# Patient Record
Sex: Female | Born: 1990 | Race: Black or African American | Hispanic: No | Marital: Single | Smoking: Former smoker
Health system: Southern US, Community
[De-identification: ages and names within clinical notes are randomized; demographics above are authoritative.]

## PROBLEM LIST (undated history)

## (undated) ENCOUNTER — Inpatient Hospital Stay (HOSPITAL_COMMUNITY): Payer: Self-pay

## (undated) DIAGNOSIS — F32A Depression, unspecified: Secondary | ICD-10-CM

## (undated) DIAGNOSIS — F209 Schizophrenia, unspecified: Secondary | ICD-10-CM

## (undated) DIAGNOSIS — B009 Herpesviral infection, unspecified: Secondary | ICD-10-CM

## (undated) DIAGNOSIS — F419 Anxiety disorder, unspecified: Secondary | ICD-10-CM

## (undated) DIAGNOSIS — E119 Type 2 diabetes mellitus without complications: Secondary | ICD-10-CM

## (undated) HISTORY — DX: Type 2 diabetes mellitus without complications: E11.9

## (undated) HISTORY — DX: Anxiety disorder, unspecified: F41.9

## (undated) HISTORY — PX: NO PAST SURGERIES: SHX2092

## (undated) HISTORY — DX: Depression, unspecified: F32.A

---

## 2018-05-28 DIAGNOSIS — E66811 Obesity, class 1: Secondary | ICD-10-CM | POA: Insufficient documentation

## 2018-05-28 DIAGNOSIS — Z6839 Body mass index (BMI) 39.0-39.9, adult: Secondary | ICD-10-CM | POA: Insufficient documentation

## 2018-10-14 HISTORY — PX: WISDOM TOOTH EXTRACTION: SHX21

## 2021-03-04 ENCOUNTER — Emergency Department (HOSPITAL_COMMUNITY): Payer: Medicare Other

## 2021-03-04 ENCOUNTER — Encounter (HOSPITAL_COMMUNITY): Payer: Self-pay | Admitting: Emergency Medicine

## 2021-03-04 ENCOUNTER — Other Ambulatory Visit: Payer: Self-pay

## 2021-03-04 ENCOUNTER — Emergency Department (HOSPITAL_COMMUNITY)
Admission: EM | Admit: 2021-03-04 | Discharge: 2021-03-04 | Disposition: A | Discharge status: Home / Self Care | Payer: Medicare Other | Attending: Emergency Medicine | Admitting: Emergency Medicine

## 2021-03-04 DIAGNOSIS — R059 Cough, unspecified: Secondary | ICD-10-CM | POA: Insufficient documentation

## 2021-03-04 DIAGNOSIS — Z20822 Contact with and (suspected) exposure to covid-19: Secondary | ICD-10-CM | POA: Insufficient documentation

## 2021-03-04 DIAGNOSIS — F17200 Nicotine dependence, unspecified, uncomplicated: Secondary | ICD-10-CM | POA: Diagnosis not present

## 2021-03-04 DIAGNOSIS — R000 Tachycardia, unspecified: Secondary | ICD-10-CM | POA: Diagnosis not present

## 2021-03-04 DIAGNOSIS — B349 Viral infection, unspecified: Secondary | ICD-10-CM

## 2021-03-04 MED ORDER — BENZONATATE 100 MG PO CAPS: 100.0000 mg | ORAL_CAPSULE | Freq: Three times a day (TID) | ORAL | 0 refills | Status: DC

## 2021-03-04 NOTE — ED Provider Notes (Signed)
[facility]-EMERGENCY DEPT Provider Note   CSN: [id number] Arrival date & time: 03/04/21  1631     History Chief Complaint  Patient presents with  . Cough    Nicole [name] is a 30 y.o. female.  HPI   Patient presents with cough x 2 days. States she was exposed to COVID 19 on Friday. Since then she has had fevers, chills, general body aches, abdominal pain and cough. She reports coughing up streaks of blood. She also has a headache and photophobia that started earlier today. She has not tried any medication for this. She denies any head trauma. This morning. She is COVID vaccinated x 2, no booster.   History reviewed. No pertinent past medical history.  There are no problems to display for this patient.   History reviewed. No pertinent surgical history.   OB History   No obstetric history on file.     No family history on file.  Social History   Tobacco Use  . Smoking status: Current Every Day Smoker  . Smokeless tobacco: Never Used  Substance Use Topics  . Alcohol use: Never  . Drug use: Never    Home Medications Prior to Admission medications   Not on File    Allergies    Patient has no allergy information on record.  Review of Systems   Review of Systems  Constitutional: Positive for appetite change, chills, fatigue and fever.  Respiratory: Positive for cough. Negative for chest tightness and shortness of breath.   Cardiovascular: Negative for chest pain and leg swelling.  Gastrointestinal: Positive for abdominal pain and nausea. Negative for blood in stool, constipation and vomiting.  Musculoskeletal: Positive for myalgias.    Physical Exam Updated Vital Signs BP 134/85 (BP Location: Right Arm)   Pulse 96   Temp 99.1 F (37.3 C) (Oral)   Resp 18   LMP 03/04/2021   SpO2 97%   Physical Exam Vitals and nursing note reviewed. Exam conducted with a chaperone present.  Constitutional:      General: She is not in acute  distress.    Appearance: Normal appearance.     Comments: Sitting in the room comfortably. Wearing sunglasses.  HENT:     Head: Normocephalic and atraumatic.     Nose: Nose normal. No congestion or rhinorrhea.     Mouth/Throat:     Mouth: Mucous membranes are moist.     Pharynx: No oropharyngeal exudate or posterior oropharyngeal erythema.  Eyes:     General: No scleral icterus.       Right eye: No discharge.        Left eye: No discharge.     Extraocular Movements: Extraocular movements intact.     Pupils: Pupils are equal, round, and reactive to light.  Cardiovascular:     Rate and Rhythm: Regular rhythm. Tachycardia present.     Pulses: Normal pulses.     Heart sounds: Normal heart sounds. No murmur heard. No friction rub. No gallop.   Pulmonary:     Effort: Pulmonary effort is normal. No respiratory distress.     Breath sounds: Normal breath sounds.     Comments: Speaking in complete sentences. Lungs CTA. Abdominal:     General: Abdomen is flat. Bowel sounds are normal. There is no distension.     Palpations: Abdomen is soft.     Tenderness: There is no abdominal tenderness.  Skin:    General: Skin is warm and dry.     Coloration: Skin  is not jaundiced.  Neurological:     General: No focal deficit present.     Mental Status: She is alert. Mental status is at baseline.     Coordination: Coordination normal.     ED Results / Procedures / Treatments   Labs (all labs ordered are listed, but only abnormal results are displayed) Labs Reviewed  SARS CORONAVIRUS 2 (TAT 6-24 HRS)    EKG None  Radiology No results found.  Procedures Procedures   Medications Ordered in ED Medications - No data to display  ED Course  I have reviewed the triage vital signs and the nursing notes.  Pertinent labs & imaging results that were available during my care of the patient were reviewed by me and considered in my medical decision making (see chart for details).    MDM  Rules/Calculators/A&P                          Patient nontoxic appearing with stable vitals on intake. She was tachycardia to 105 on exam. Otherwise unremarkable PE. CXR ordered to evaluate for pneumonia and resp. Swab for COVID/FLU. Doubt emergent pathology of headache given age, does not smoke, no head trauma, no focal deficits on Neuro exam. Perc negative  CXR without signs PNA, PTX or emergent pulmonary pathology. Patient is agreeable to taking motrin and tylenol as needed for symptomatic control. Also prescribed her tessalon perles. She is agreeable to being called if her covid comes back positive. Return precautions given. Patient is agreeable and verbalized understanding to the plan.   Final Clinical Impression(s) / ED Diagnoses Final diagnoses:  None    Rx / DC Orders ED Discharge Orders    None       Theron Arista, Cordelia Poche 03/09/21 1214    Charlynne Pander, MD 03/13/21 [phone]

## 2021-03-04 NOTE — ED Notes (Signed)
Patient reports that she does not have a ride home.

## 2021-03-04 NOTE — Discharge Instructions (Signed)
You were seen today for viral symptoms. IF you are COVID+ you will get a phone call tonight from the hospital. If you are negative, you will not hear from [place]. You can alternate tylenol and ibuprofen as needed for pain control. I've also prescribed you teslon pearls to help with the coughing. If you experience uncontrollable vomiting, respiratory distress or are unable to keep in fluids please return to the ED. Call to establish care with [facility] to establish a PCP.

## 2021-03-04 NOTE — ED Notes (Signed)
Patient provided with phone to attempt to call ride.

## 2021-03-04 NOTE — ED Triage Notes (Signed)
Arrives via EMS from home, C/C cough and abdominal pain, exposed to COVID on Friday, endorses nausea but constant nausea. Did not take any medication today. Denies N/V/D. Vital signs WNL.

## 2021-03-05 LAB — SARS CORONAVIRUS 2 (TAT 6-24 HRS): SARS Coronavirus 2: NEGATIVE

## 2021-06-14 ENCOUNTER — Other Ambulatory Visit: Payer: Self-pay | Admitting: Emergency Medicine

## 2021-06-15 LAB — C. TRACHOMATIS/N. GONORRHOEAE RNA
C. trachomatis RNA, TMA: NOT DETECTED
N. gonorrhoeae RNA, TMA: NOT DETECTED

## 2021-07-13 ENCOUNTER — Emergency Department (HOSPITAL_COMMUNITY)
Admission: EM | Admit: 2021-07-13 | Discharge: 2021-07-13 | Disposition: A | Discharge status: Home / Self Care | Payer: Medicare Other | Attending: Emergency Medicine | Admitting: Emergency Medicine

## 2021-07-13 DIAGNOSIS — N390 Urinary tract infection, site not specified: Secondary | ICD-10-CM | POA: Diagnosis not present

## 2021-07-13 DIAGNOSIS — R300 Dysuria: Secondary | ICD-10-CM | POA: Diagnosis present

## 2021-07-13 DIAGNOSIS — F17210 Nicotine dependence, cigarettes, uncomplicated: Secondary | ICD-10-CM | POA: Diagnosis not present

## 2021-07-13 DIAGNOSIS — Z20822 Contact with and (suspected) exposure to covid-19: Secondary | ICD-10-CM | POA: Insufficient documentation

## 2021-07-13 LAB — COMPREHENSIVE METABOLIC PANEL
ALT: 17 U/L (ref 0–44)
AST: 14 U/L — ABNORMAL LOW (ref 15–41)
Albumin: 3.6 g/dL (ref 3.5–5.0)
Alkaline Phosphatase: 54 U/L (ref 38–126)
Anion gap: 7 (ref 5–15)
BUN: 11 mg/dL (ref 6–20)
CO2: 24 mmol/L (ref 22–32)
Calcium: 8.9 mg/dL (ref 8.9–10.3)
Chloride: 105 mmol/L (ref 98–111)
Creatinine, Ser: 0.84 mg/dL (ref 0.44–1.00)
GFR, Estimated: >60 mL/min (ref >60)
Glucose, Bld: 82 mg/dL (ref 70–99)
Potassium: 4 mmol/L (ref 3.5–5.1)
Sodium: 136 mmol/L (ref 135–145)
Total Bilirubin: 0.4 mg/dL (ref 0.3–1.2)
Total Protein: 6.6 g/dL (ref 6.5–8.1)

## 2021-07-13 LAB — CBC WITH DIFFERENTIAL/PLATELET
Abs Immature Granulocytes: 0.02 10*3/uL (ref 0.00–0.07)
Basophils Absolute: 0 10*3/uL (ref 0.0–0.1)
Basophils Relative: 0 %
Eosinophils Absolute: 0 10*3/uL (ref 0.0–0.5)
Eosinophils Relative: 0 %
HCT: 37.9 % (ref 36.0–46.0)
Hemoglobin: 12.4 g/dL (ref 12.0–15.0)
Immature Granulocytes: 0 %
Lymphocytes Relative: 53 %
Lymphs Abs: 3.2 10*3/uL (ref 0.7–4.0)
MCH: 29.2 pg (ref 26.0–34.0)
MCHC: 32.7 g/dL (ref 30.0–36.0)
MCV: 89.4 fL (ref 80.0–100.0)
Monocytes Absolute: 0.4 10*3/uL (ref 0.1–1.0)
Monocytes Relative: 6 %
Neutro Abs: 2.5 10*3/uL (ref 1.7–7.7)
Neutrophils Relative %: 41 %
Platelets: 253 10*3/uL (ref 150–400)
RBC: 4.24 MIL/uL (ref 3.87–5.11)
RDW: 13.5 % (ref 11.5–15.5)
WBC: 6.1 10*3/uL (ref 4.0–10.5)
nRBC: 0 % (ref 0.0–0.2)

## 2021-07-13 LAB — URINALYSIS, ROUTINE W REFLEX MICROSCOPIC
Bilirubin Urine: NEGATIVE
Glucose, UA: NEGATIVE mg/dL
Ketones, ur: NEGATIVE mg/dL
Nitrite: NEGATIVE
Protein, ur: NEGATIVE mg/dL
Specific Gravity, Urine: 1.025 (ref 1.005–1.030)
pH: 7 (ref 5.0–8.0)

## 2021-07-13 LAB — LIPASE, BLOOD: Lipase: 27 U/L (ref 11–51)

## 2021-07-13 LAB — URINALYSIS, MICROSCOPIC (REFLEX)

## 2021-07-13 LAB — I-STAT BETA HCG BLOOD, ED (MC, WL, AP ONLY): I-stat hCG, quantitative: <5 m[IU]/mL (ref <5)

## 2021-07-13 LAB — RESP PANEL BY RT-PCR (FLU A&B, COVID) ARPGX2
Influenza A by PCR: NEGATIVE
Influenza B by PCR: NEGATIVE
SARS Coronavirus 2 by RT PCR: NEGATIVE

## 2021-07-13 MED ORDER — ONDANSETRON HCL 4 MG PO TABS: 4.0000 mg | ORAL_TABLET | Freq: Four times a day (QID) | ORAL | 0 refills | Status: DC

## 2021-07-13 MED ORDER — CEPHALEXIN 500 MG PO CAPS: 500.0000 mg | ORAL_CAPSULE | Freq: Two times a day (BID) | ORAL | 0 refills | Status: AC

## 2021-07-13 MED ORDER — CEPHALEXIN 250 MG PO CAPS
500.0000 mg | ORAL_CAPSULE | Freq: Once | ORAL | Status: AC
  Administered 2021-07-13: 500 mg via ORAL
  Filled 2021-07-13: qty 2

## 2021-07-13 MED ORDER — ONDANSETRON HCL 4 MG PO TABS
4.0000 mg | ORAL_TABLET | Freq: Once | ORAL | Status: AC
  Administered 2021-07-13: 4 mg via ORAL
  Filled 2021-07-13: qty 1

## 2021-07-13 NOTE — ED Provider Notes (Signed)
Emergency Medicine Provider Triage Evaluation Note  Nicole [name] , a 30 y.o. female  was evaluated in triage.  Pt complains of nv, abd pain, urinary sxs for 1 week. Group home wants a covid test.  Review of Systems  Positive: Nv, lower abd pain, urinary sxs Negative: cough  Physical Exam  BP 111/77 (BP Location: Right Arm)   Pulse 97   Temp 98.6 F (37 C) (Oral)   Resp 18   SpO2 98%  Gen:   Awake, no distress   Resp:  Normal effort  MSK:   Moves extremities without difficulty   Medical Decision Making  Medically screening exam initiated at 3:00 PM.  Appropriate orders placed.  [name] was informed that the remainder of the evaluation will be completed by another provider, this initial triage assessment does not replace that evaluation, and the importance of remaining in the ED until their evaluation is complete.     [name], PA-C 07/13/21 1501    [name], MD 07/15/21 1124

## 2021-07-13 NOTE — ED Triage Notes (Signed)
Pt here from group home with c/o generalized body aches and malaise X 1 week. Group home owner wants pt tested for covid .

## 2021-07-13 NOTE — ED Notes (Signed)
Pt refused dc vital signs because her cab was here to pick her up.

## 2021-07-13 NOTE — ED Provider Notes (Signed)
[facility] EMERGENCY DEPARTMENT Provider Note   CSN: [id number] Arrival date & time: 07/13/21  1454     History Chief Complaint  Patient presents with   Generalized Body Aches    Nicole [name] is a 30 y.o. female.  The history is provided by the patient.  Dysuria Pain quality:  Aching Pain severity:  Mild Onset quality:  Gradual Timing:  Constant Progression:  Unchanged Chronicity:  New Recent urinary tract infections: yes   Relieved by:  Nothing Worsened by:  Nothing Urinary symptoms: frequent urination   Associated symptoms: nausea   Associated symptoms: no abdominal pain, no fever, no flank pain, no genital lesions, no vaginal discharge and no vomiting   Associated symptoms comment:  Patient is also felt achy     No past medical history on file.  There are no problems to display for this patient.   No past surgical history on file.   OB History   No obstetric history on file.     No family history on file.  Social History   Tobacco Use   Smoking status: Every Day   Smokeless tobacco: Never  Substance Use Topics   Alcohol use: Never   Drug use: Never    Home Medications Prior to Admission medications   Medication Sig Start Date End Date Taking? Authorizing Provider  cephALEXin (KEFLEX) 500 MG capsule Take 1 capsule (500 mg total) by mouth 2 (two) times daily for 5 days. 07/13/21 07/18/21 Yes Piper Albro, DO  ondansetron (ZOFRAN) 4 MG tablet Take 1 tablet (4 mg total) by mouth every 6 (six) hours. 07/13/21  Yes Isaih Bulger, DO  benzonatate (TESSALON) 100 MG capsule Take 1 capsule (100 mg total) by mouth every 8 (eight) hours. 03/04/21   Theron Arista, PA-C    Allergies    Patient has no allergy information on record.  Review of Systems   Review of Systems  Constitutional:  Negative for chills and fever.  HENT:  Negative for ear pain and sore throat.   Eyes:  Negative for pain and visual disturbance.  Respiratory:  Negative  for cough and shortness of breath.   Cardiovascular:  Negative for chest pain and palpitations.  Gastrointestinal:  Positive for nausea. Negative for abdominal pain and vomiting.  Genitourinary:  Positive for dysuria. Negative for decreased urine volume, flank pain, hematuria, pelvic pain and vaginal discharge.  Musculoskeletal:  Negative for arthralgias and back pain.  Skin:  Negative for color change and rash.  Neurological:  Negative for seizures and syncope.  All other systems reviewed and are negative.  Physical Exam Updated Vital Signs BP 111/77 (BP Location: Right Arm)   Pulse 97   Temp 98.6 F (37 C) (Oral)   Resp 18   SpO2 98%   Physical Exam Vitals and nursing note reviewed.  Constitutional:      General: She is not in acute distress.    Appearance: She is well-developed. She is not ill-appearing.  HENT:     Head: Normocephalic and atraumatic.  Eyes:     Extraocular Movements: Extraocular movements intact.     Conjunctiva/sclera: Conjunctivae normal.     Pupils: Pupils are equal, round, and reactive to light.  Cardiovascular:     Rate and Rhythm: Normal rate and regular rhythm.     Pulses: Normal pulses.     Heart sounds: Normal heart sounds. No murmur heard. Pulmonary:     Effort: Pulmonary effort is normal. No respiratory distress.  Breath sounds: Normal breath sounds.  Abdominal:     Palpations: Abdomen is soft.     Tenderness: There is no abdominal tenderness.  Musculoskeletal:     Cervical back: Normal range of motion and neck supple.  Skin:    General: Skin is warm and dry.     Capillary Refill: Capillary refill takes less than 2 seconds.  Neurological:     General: No focal deficit present.     Mental Status: She is alert.    ED Results / Procedures / Treatments   Labs (all labs ordered are listed, but only abnormal results are displayed) Labs Reviewed  COMPREHENSIVE METABOLIC PANEL - Abnormal; Notable for the following components:      Result  Value   AST 14 (*)    All other components within normal limits  URINALYSIS, ROUTINE W REFLEX MICROSCOPIC - Abnormal; Notable for the following components:   Hgb urine dipstick TRACE (*)    Leukocytes,Ua LARGE (*)    All other components within normal limits  URINALYSIS, MICROSCOPIC (REFLEX) - Abnormal; Notable for the following components:   Bacteria, UA MANY (*)    All other components within normal limits  RESP PANEL BY RT-PCR (FLU A&B, COVID) ARPGX2  URINE CULTURE  CBC WITH DIFFERENTIAL/PLATELET  LIPASE, BLOOD  I-STAT BETA HCG BLOOD, ED ([facility], [facility], [facility] ONLY)    EKG None  Radiology No results found.  Procedures Procedures   Medications Ordered in ED Medications  ondansetron (ZOFRAN) tablet 4 mg (has no administration in time range)  cephALEXin (KEFLEX) capsule 500 mg (has no administration in time range)    ED Course  I have reviewed the triage vital signs and the nursing notes.  Pertinent labs & imaging results that were available during my care of the patient were reviewed by me and considered in my medical decision making (see chart for details).    MDM Rules/Calculators/A&P                           Nicole [name] is here with body aches, dysuria.  Normal vitals.  No fever.  Denies any vaginal bleeding, vaginal discharge, hematuria, flank pain.  Has felt nauseous.  No abdominal pain.  Feels like UTI.  Concern may be for COVID.  Abdominal exam is benign.  She is very well-appearing.  Lab work was collected prior to my evaluation that is unremarkable.  No leukocytosis, no anemia, no significant electrolyte abnormality.  hCG within normal and not pregnant.  Urinalysis appears to be consistent with infection and she is symptomatic.  Will treat with antibiotics.  Will give Zofran.  Discharged in good condition.  Understands return precautions.  This chart was dictated using voice recognition software.  Despite best efforts to proofread,  errors can occur which can change the  documentation meaning. \ Final Clinical Impression(s) / ED Diagnoses Final diagnoses:  Lower urinary tract infectious disease    Rx / DC Orders ED Discharge Orders          Ordered    cephALEXin (KEFLEX) 500 MG capsule  2 times daily        07/13/21 1739    ondansetron (ZOFRAN) 4 MG tablet  Every 6 hours        07/13/21 1739             Virgina Norfolk, DO 07/13/21 1739

## 2021-07-15 LAB — URINE CULTURE: Culture: >=100000 — AB

## 2021-07-16 ENCOUNTER — Telehealth: Payer: Self-pay

## 2021-07-16 NOTE — Telephone Encounter (Signed)
Post ED Visit - Positive Culture Follow-up  Culture report reviewed by antimicrobial stewardship pharmacist: [facility] [x]  , Pharm.D. []  Loleta Dicker, Pharm.D., BCPS AQ-ID []  , Pharm.D., BCPS []  Celedonio Miyamoto, Pharm.D., BCPS []  [place], Garvin Fila.D., BCPS, AAHIVP []  , Pharm.D., BCPS, AAHIVP []  Georgina Pillion, PharmD, BCPS []  , PharmD, BCPS []  [place], PharmD, BCPS []  [place], PharmD []  , PharmD, BCPS []  Estella Husk, PharmD  Pharmacy Team []  Lysle Pearl, PharmD []  , PharmD []  Phillips Climes, PharmD []  , Rph []  Agapito Games) , PharmD []  Verlan Friends, PharmD []  , PharmD []  Mervyn Gay, PharmD []  , PharmD []  Vinnie Level, PharmD []  [facility], PharmD []  , PharmD []  Len Childs, PharmD   Positive urine culture Treated with Cephalexin, organism sensitive to the same and no further patient follow-up is required at this time.  07/16/2021, 11:45 AM

## 2021-08-30 ENCOUNTER — Other Ambulatory Visit: Payer: Self-pay

## 2021-08-30 ENCOUNTER — Encounter: Payer: Self-pay | Admitting: Podiatry

## 2021-08-30 ENCOUNTER — Ambulatory Visit (INDEPENDENT_AMBULATORY_CARE_PROVIDER_SITE_OTHER): Payer: Medicare Other | Admitting: Podiatry

## 2021-08-30 DIAGNOSIS — L603 Nail dystrophy: Secondary | ICD-10-CM

## 2021-09-03 NOTE — Progress Notes (Signed)
  Subjective:  Patient ID: Nicole [name], female    DOB: December 15, 1990,  MRN: [id number]  Chief Complaint  Patient presents with   Ingrown Toenail     NP Ingrown toenail left foot     30 y.o. female presents with the above complaint. History confirmed with patient.  Nail is lifting off and discolored  Objective:  Physical Exam: warm, good capillary refill, no trophic changes or ulcerative lesions, normal DP and PT pulses, and normal sensory exam. Left Foot: normal exam, no swelling, tenderness, instability; ligaments intact, full range of motion of all ankle/foot joints Right Foot: Hallux nail with slight onycholysis and brown discoloration  Assessment:   1. Nail dystrophy      Plan:  Patient was evaluated and treated and all questions answered.  Discussed etiology and treatments for nail dystrophy onycholysis and onychomycosis.  I recommended culture of the nail plate to elucidate the etiology of this.  We will discuss her results once we have them back and decide on treatment options.  Return for i will call with test results .

## 2021-09-29 ENCOUNTER — Emergency Department (HOSPITAL_COMMUNITY)
Admission: EM | Admit: 2021-09-29 | Discharge: 2021-09-29 | Disposition: A | Discharge status: Home / Self Care | Payer: Medicare Other | Attending: Emergency Medicine | Admitting: Emergency Medicine

## 2021-09-29 ENCOUNTER — Encounter (HOSPITAL_COMMUNITY): Payer: Self-pay

## 2021-09-29 DIAGNOSIS — R112 Nausea with vomiting, unspecified: Secondary | ICD-10-CM | POA: Diagnosis not present

## 2021-09-29 DIAGNOSIS — R109 Unspecified abdominal pain: Secondary | ICD-10-CM | POA: Diagnosis present

## 2021-09-29 DIAGNOSIS — Z5321 Procedure and treatment not carried out due to patient leaving prior to being seen by health care provider: Secondary | ICD-10-CM | POA: Insufficient documentation

## 2021-09-29 LAB — LIPASE, BLOOD: Lipase: 28 U/L (ref 11–51)

## 2021-09-29 LAB — URINALYSIS, ROUTINE W REFLEX MICROSCOPIC
Bilirubin Urine: NEGATIVE
Glucose, UA: NEGATIVE mg/dL
Hgb urine dipstick: NEGATIVE
Ketones, ur: NEGATIVE mg/dL
Nitrite: NEGATIVE
Protein, ur: NEGATIVE mg/dL
Specific Gravity, Urine: >1.03 — ABNORMAL HIGH (ref 1.005–1.030)
pH: 6 (ref 5.0–8.0)

## 2021-09-29 LAB — URINALYSIS, MICROSCOPIC (REFLEX)

## 2021-09-29 LAB — COMPREHENSIVE METABOLIC PANEL
ALT: 18 U/L (ref 0–44)
AST: 17 U/L (ref 15–41)
Albumin: 3.8 g/dL (ref 3.5–5.0)
Alkaline Phosphatase: 61 U/L (ref 38–126)
Anion gap: 5 (ref 5–15)
BUN: 15 mg/dL (ref 6–20)
CO2: 25 mmol/L (ref 22–32)
Calcium: 8.9 mg/dL (ref 8.9–10.3)
Chloride: 105 mmol/L (ref 98–111)
Creatinine, Ser: 0.84 mg/dL (ref 0.44–1.00)
GFR, Estimated: >60 mL/min (ref >60)
Glucose, Bld: 90 mg/dL (ref 70–99)
Potassium: 3.6 mmol/L (ref 3.5–5.1)
Sodium: 135 mmol/L (ref 135–145)
Total Bilirubin: 0.4 mg/dL (ref 0.3–1.2)
Total Protein: 6.9 g/dL (ref 6.5–8.1)

## 2021-09-29 LAB — I-STAT BETA HCG BLOOD, ED (MC, WL, AP ONLY): I-stat hCG, quantitative: <5 m[IU]/mL (ref <5)

## 2021-09-29 LAB — CBC
HCT: 39.2 % (ref 36.0–46.0)
Hemoglobin: 13 g/dL (ref 12.0–15.0)
MCH: 30.2 pg (ref 26.0–34.0)
MCHC: 33.2 g/dL (ref 30.0–36.0)
MCV: 91.2 fL (ref 80.0–100.0)
Platelets: 273 10*3/uL (ref 150–400)
RBC: 4.3 MIL/uL (ref 3.87–5.11)
RDW: 13 % (ref 11.5–15.5)
WBC: 6.9 10*3/uL (ref 4.0–10.5)
nRBC: 0 % (ref 0.0–0.2)

## 2021-09-29 NOTE — ED Triage Notes (Signed)
Pt comes via [facility] from [facility] group home for abd pain and n/v for the past hour.

## 2021-09-29 NOTE — ED Notes (Signed)
Pt LWBS 

## 2021-10-12 ENCOUNTER — Emergency Department (HOSPITAL_COMMUNITY): Payer: Medicare Other

## 2021-10-12 ENCOUNTER — Encounter (HOSPITAL_COMMUNITY): Payer: Self-pay | Admitting: Obstetrics & Gynecology

## 2021-10-12 ENCOUNTER — Other Ambulatory Visit: Payer: Self-pay

## 2021-10-12 ENCOUNTER — Emergency Department (HOSPITAL_COMMUNITY)
Admission: AD | Admit: 2021-10-12 | Discharge: 2021-10-13 | Disposition: A | Discharge status: Home / Self Care | Payer: Medicare Other | Attending: Obstetrics & Gynecology | Admitting: Obstetrics & Gynecology

## 2021-10-12 DIAGNOSIS — N83202 Unspecified ovarian cyst, left side: Secondary | ICD-10-CM | POA: Insufficient documentation

## 2021-10-12 DIAGNOSIS — F17200 Nicotine dependence, unspecified, uncomplicated: Secondary | ICD-10-CM | POA: Diagnosis not present

## 2021-10-12 DIAGNOSIS — N939 Abnormal uterine and vaginal bleeding, unspecified: Secondary | ICD-10-CM

## 2021-10-12 DIAGNOSIS — R102 Pelvic and perineal pain: Secondary | ICD-10-CM

## 2021-10-12 DIAGNOSIS — R1030 Lower abdominal pain, unspecified: Secondary | ICD-10-CM | POA: Insufficient documentation

## 2021-10-12 DIAGNOSIS — R5383 Other fatigue: Secondary | ICD-10-CM | POA: Insufficient documentation

## 2021-10-12 LAB — COMPREHENSIVE METABOLIC PANEL
ALT: 17 U/L (ref 0–44)
AST: 16 U/L (ref 15–41)
Albumin: 3.6 g/dL (ref 3.5–5.0)
Alkaline Phosphatase: 65 U/L (ref 38–126)
Anion gap: 4 — ABNORMAL LOW (ref 5–15)
BUN: 14 mg/dL (ref 6–20)
CO2: 24 mmol/L (ref 22–32)
Calcium: 8.5 mg/dL — ABNORMAL LOW (ref 8.9–10.3)
Chloride: 107 mmol/L (ref 98–111)
Creatinine, Ser: 0.95 mg/dL (ref 0.44–1.00)
GFR, Estimated: >60 mL/min (ref >60)
Glucose, Bld: 76 mg/dL (ref 70–99)
Potassium: 3.5 mmol/L (ref 3.5–5.1)
Sodium: 135 mmol/L (ref 135–145)
Total Bilirubin: 0.1 mg/dL — ABNORMAL LOW (ref 0.3–1.2)
Total Protein: 6.9 g/dL (ref 6.5–8.1)

## 2021-10-12 LAB — URINALYSIS, ROUTINE W REFLEX MICROSCOPIC
Bilirubin Urine: NEGATIVE
Glucose, UA: NEGATIVE mg/dL
Ketones, ur: NEGATIVE mg/dL
Nitrite: NEGATIVE
Protein, ur: 30 mg/dL — AB
Specific Gravity, Urine: 1.025 (ref 1.005–1.030)
pH: 6 (ref 5.0–8.0)

## 2021-10-12 LAB — CBC WITH DIFFERENTIAL/PLATELET
Abs Immature Granulocytes: 0.01 10*3/uL (ref 0.00–0.07)
Basophils Absolute: 0 10*3/uL (ref 0.0–0.1)
Basophils Relative: 0 %
Eosinophils Absolute: 0 10*3/uL (ref 0.0–0.5)
Eosinophils Relative: 0 %
HCT: 37.1 % (ref 36.0–46.0)
Hemoglobin: 12.2 g/dL (ref 12.0–15.0)
Immature Granulocytes: 0 %
Lymphocytes Relative: 61 %
Lymphs Abs: 3.3 10*3/uL (ref 0.7–4.0)
MCH: 30.2 pg (ref 26.0–34.0)
MCHC: 32.9 g/dL (ref 30.0–36.0)
MCV: 91.8 fL (ref 80.0–100.0)
Monocytes Absolute: 0.3 10*3/uL (ref 0.1–1.0)
Monocytes Relative: 5 %
Neutro Abs: 1.8 10*3/uL (ref 1.7–7.7)
Neutrophils Relative %: 34 %
Platelets: 233 10*3/uL (ref 150–400)
RBC: 4.04 MIL/uL (ref 3.87–5.11)
RDW: 13 % (ref 11.5–15.5)
WBC: 5.5 10*3/uL (ref 4.0–10.5)
nRBC: 0 % (ref 0.0–0.2)

## 2021-10-12 LAB — I-STAT BETA HCG BLOOD, ED (MC, WL, AP ONLY): I-stat hCG, quantitative: <5 m[IU]/mL (ref <5)

## 2021-10-12 LAB — LIPASE, BLOOD: Lipase: 30 U/L (ref 11–51)

## 2021-10-12 LAB — POCT PREGNANCY, URINE: Preg Test, Ur: NEGATIVE

## 2021-10-12 MED ORDER — IOHEXOL 300 MG/ML  SOLN
100.0000 mL | Freq: Once | INTRAMUSCULAR | Status: AC | PRN
  Administered 2021-10-12: 21:00:00 100 mL via INTRAVENOUS

## 2021-10-12 NOTE — MAU Note (Signed)
Report given to [facility], ED Charge Nurse.

## 2021-10-12 NOTE — MAU Provider Note (Signed)
° °  S [name] is a 30 y.o. No obstetric history on file. patient who presents to MAU today with complaint of vaginal bleeding abdominal and low back pain. Patient reports this is the third time this month that she has gotten her period. For the past 3 hours has changed her pad every hour. This bleeding is atypical for her. Reports feeling fatigued, having more headaches, and nausea. Started having mid sternal chest pain last night that she currently rates 7/10. Pain is constant. Denies SOB, dizziness, or cardiac history. Took ibuprofen last night which helped ease the pain some, none today. Patient has an IUD that was placed in 2020. Has not taken a home pregnancy test.   O BP 120/89 (BP Location: Right Arm)    Pulse (!) 105    Temp 98.4 F (36.9 C) (Oral)    Resp 17    Ht 5\' 1"  (1.549 m)    Wt 90.9 kg    SpO2 100%    BMI 37.87 kg/m   Physical Exam Vitals and nursing note reviewed.  Constitutional:      General: She is not in acute distress.    Appearance: She is obese. She is not ill-appearing.  Cardiovascular:     Rate and Rhythm: Tachycardia present.  Pulmonary:     Effort: Pulmonary effort is normal. No respiratory distress.  Abdominal:     Palpations: Abdomen is soft.     Tenderness: There is no abdominal tenderness.  Skin:    General: Skin is warm and dry.  Neurological:     General: No focal deficit present.     Mental Status: She is alert and oriented to person, place, and time.  Psychiatric:        Mood and Affect: Affect is flat.        Speech: Speech normal.        Behavior: Behavior is cooperative.   EKG performed  A Medical screening exam complete UPT negative Vaginal bleeding Abdominal pain Chest pain  P Spoke with , PA in [place], patient transferred for further evaluation and work up    [place], [employer] 10/12/2021 1:57 PM

## 2021-10-12 NOTE — MAU Note (Signed)
...  Nicole [name] is a 30 y.o. at Unknown here in MAU reporting: This is her third time bleeding this month and it started this past Tuesday or Wednesday. She states she is experiencing constant lower abdominal pain and fatigue. She states she is filling three maxi pads in one hour and at times will use tampons. She states she is concerned she has an internal bump or cyst because she experiences pain when she removes her tampon.  Currently has an IUD that was placed in 2020.  9/10 lower abdominal

## 2021-10-12 NOTE — ED Provider Notes (Signed)
Emergency Medicine Provider Triage Evaluation Note  Nicole [name] , a 30 y.o. female  was evaluated in triage.  Pt complains of diffuse abdominal pain and vaginal bleeding.  She states that she has had 3 episodes this month of heavy vaginal bleeding that have lasted around 5 to 7 days total.  Was seen and evaluated at MAU and had negative pregnancy test and was sent down to the emergency department for evaluation.  Review of Systems  Positive:  Negative: See above   Physical Exam  BP 113/85 (BP Location: Left Arm)    Pulse 94    Temp 98.7 F (37.1 C) (Oral)    Resp 18    Ht 5\' 1"  (1.549 m)    Wt 90.9 kg    SpO2 100%    BMI 37.87 kg/m  Gen:   Awake, no distress   Resp:  Normal effort  MSK:   Moves extremities without difficulty  Other:  Diffuse abdominal tenderness  Medical Decision Making  Medically screening exam initiated at 3:27 PM.  Appropriate orders placed.  Nicole [name] was informed that the remainder of the evaluation will be completed by another provider, this initial triage assessment does not replace that evaluation, and the importance of remaining in the ED until their evaluation is complete.     Nicole Masson [place], PA-C 10/12/21 1528    10/14/21, MD 10/12/21 [phone]

## 2021-10-12 NOTE — ED Triage Notes (Signed)
Pt reports to me she is here because she has had 3 periods in the last month, lower abdominal pain and wants to know why she hasn't stopped bleeding. Seen at MAU and transferred to [place] because of negative pregnancy test. VSS at present.

## 2021-10-13 ENCOUNTER — Emergency Department (HOSPITAL_COMMUNITY): Payer: Medicare Other

## 2021-10-13 DIAGNOSIS — N939 Abnormal uterine and vaginal bleeding, unspecified: Secondary | ICD-10-CM | POA: Diagnosis not present

## 2021-10-13 LAB — WET PREP, GENITAL
Clue Cells Wet Prep HPF POC: NONE SEEN
Sperm: NONE SEEN
WBC, Wet Prep HPF POC: <10 (ref <10)
Yeast Wet Prep HPF POC: NONE SEEN

## 2021-10-13 MED ORDER — NAPROXEN 500 MG PO TABS: 500.0000 mg | ORAL_TABLET | Freq: Two times a day (BID) | ORAL | 0 refills | Status: AC

## 2021-10-13 MED ORDER — METRONIDAZOLE 500 MG PO TABS: 500.0000 mg | ORAL_TABLET | Freq: Two times a day (BID) | ORAL | 0 refills | Status: AC

## 2021-10-13 MED ORDER — HYDROCODONE-ACETAMINOPHEN 5-325 MG PO TABS
1.0000 | ORAL_TABLET | Freq: Once | ORAL | Status: AC
  Administered 2021-10-13: 1 via ORAL
  Filled 2021-10-13: qty 1

## 2021-10-13 MED ORDER — IBUPROFEN 400 MG PO TABS
600.0000 mg | ORAL_TABLET | Freq: Once | ORAL | Status: AC
  Administered 2021-10-13: 600 mg via ORAL
  Filled 2021-10-13: qty 1

## 2021-10-13 NOTE — Discharge Instructions (Addendum)
Your ultrasound showed an ovarian cyst which is the likely source of your pain  You may alternate taking Tylenol and Naproxen as needed for pain control. You may take Naproxen twice daily as directed on your discharge paperwork and you may take  [phone] mg of Tylenol every 6 hours. Do not exceed 4000 mg of Tylenol daily as this can lead to liver damage. Also, make sure to take Naproxen with meals as it can cause an upset stomach. Do not take other NSAIDs while taking Naproxen such as (Aleve, Ibuprofen, Aspirin, Celebrex, etc) and do not take more than the prescribed dose as this can lead to ulcers and bleeding in your GI tract. You may use warm and cold compresses to help with your symptoms.   Please follow up with your primary doctor or with the OB-GYN that was provided on your paperwork within the next 7-10 days for re-evaluation and further treatment of your symptoms.   Please return to the ER sooner if you have any new or worsening symptoms.

## 2021-10-13 NOTE — ED Provider Notes (Signed)
[place] EMERGENCY DEPARTMENT Provider Note   CSN: [id number] Arrival date & time: 10/12/21  1301     History Chief Complaint  Patient presents with   Abdominal Pain   Fatigue    Nicole [name] is a 30 y.o. female.  HPI  30 year old female presents the emergency department today for evaluation of vaginal bleeding.  Patient states that she has had 3 menstrual cycles this month.  She is also complaining of some lower pelvic pain and states it is worse on the left.  She denies any vaginal discharge, urinary symptoms or fevers.  Denies any vomiting or diarrhea.  She never had similar symptoms before.  She currently has an IUD.  She does not have an OB/GYN in the area.  History reviewed. No pertinent past medical history.  There are no problems to display for this patient.   History reviewed. No pertinent surgical history.   OB History   No obstetric history on file.     History reviewed. No pertinent family history.  Social History   Tobacco Use   Smoking status: Every Day   Smokeless tobacco: Never  Substance Use Topics   Alcohol use: Never   Drug use: Never    Home Medications Prior to Admission medications   Medication Sig Start Date End Date Taking? Authorizing Provider  fluticasone (FLONASE) 50 MCG/ACT nasal spray Place into both nostrils. 08/22/21  Yes [provider]  hydrOXYzine (ATARAX/VISTARIL) 50 MG tablet Take 50 mg by mouth 2 (two) times daily. 07/28/21  Yes [provider]  IBU 800 MG tablet Take 800 mg by mouth 2 (two) times daily. 06/27/21  Yes [provider]  meloxicam (MOBIC) 15 MG tablet Take 15 mg by mouth daily. 08/22/21  Yes [provider]  metroNIDAZOLE (FLAGYL) 500 MG tablet Take 1 tablet (500 mg total) by mouth 2 (two) times daily for 7 days. 10/13/21 10/20/21 Yes Mireille Lacombe S, PA-C  naproxen (NAPROSYN) 500 MG tablet Take 1 tablet (500 mg total) by mouth 2 (two) times daily for 7  days. 10/13/21 10/20/21 Yes Shonna Deiter S, PA-C  Oxcarbazepine (TRILEPTAL) 300 MG tablet Take 300 mg by mouth 2 (two) times daily. 08/22/21  Yes [provider]  QUEtiapine (SEROQUEL) 400 MG tablet Take 400 mg by mouth 2 (two) times daily. 08/22/21  Yes [provider]  sertraline (ZOLOFT) 50 MG tablet Take 150 mg by mouth daily. 08/22/21  Yes [provider]  traZODone (DESYREL) 100 MG tablet Take 100 mg by mouth at bedtime. 08/22/21  Yes [provider]  valACYclovir (VALTREX) 500 MG tablet Take 500 mg by mouth daily. 08/11/21  Yes [provider]  ABILIFY MAINTENA 400 MG PRSY prefilled syringe SMARTSIG:1 Each IM Once a Month Patient not taking: Reported on 10/13/2021 08/10/21   [provider]  benzonatate (TESSALON) 100 MG capsule Take 1 capsule (100 mg total) by mouth every 8 (eight) hours. Patient not taking: Reported on 10/13/2021 03/04/21   Sherrill Raring, PA-C  metroNIDAZOLE (FLAGYL) 500 MG tablet Take 500 mg by mouth 2 (two) times daily. Patient not taking: Reported on 10/13/2021 06/15/21   [provider]  nicotine (NICODERM CQ - DOSED IN MG/24 HOURS) 21 mg/24hr patch daily. Patient not taking: Reported on 10/13/2021 07/19/21   [provider]  ondansetron (ZOFRAN) 4 MG tablet Take 1 tablet (4 mg total) by mouth every 6 (six) hours. Patient not taking: Reported on 10/13/2021 07/13/21   Lennice Sites, DO  [place],  0.25 OR 0.5 MG/DOSE, 2 MG/1.5ML SOPN SMARTSIG:0.5 Milligram(s) Topical Once a Week Patient not taking: Reported on 10/13/2021 06/28/21   [provider]    Allergies    Patient has no known allergies.  Review of Systems   Review of Systems  Constitutional:  Negative for chills and fever.  HENT:  Negative for ear pain and sore throat.   Eyes:  Negative for visual disturbance.  Respiratory:  Negative for cough and shortness of breath.   Cardiovascular:  Negative for chest pain.  Gastrointestinal:   Positive for abdominal pain. Negative for constipation, diarrhea, nausea and vomiting.  Genitourinary:  Positive for pelvic pain and vaginal bleeding. Negative for dysuria and hematuria.  Musculoskeletal:  Negative for back pain.  Skin:  Negative for rash.  Neurological:  Negative for headaches.  All other systems reviewed and are negative.  Physical Exam Updated Vital Signs BP (!) 128/99    Pulse 95    Temp 98.7 F (37.1 C) (Oral)    Resp 16    Ht 5\' 1"  (1.549 m)    Wt 90.9 kg    SpO2 100%    BMI 37.87 kg/m   Physical Exam Vitals and nursing note reviewed.  Constitutional:      General: She is not in acute distress.    Appearance: She is well-developed.  HENT:     Head: Normocephalic and atraumatic.  Eyes:     Conjunctiva/sclera: Conjunctivae normal.  Cardiovascular:     Rate and Rhythm: Normal rate and regular rhythm.     Heart sounds: No murmur heard. Pulmonary:     Effort: Pulmonary effort is normal. No respiratory distress.     Breath sounds: Normal breath sounds.  Abdominal:     Palpations: Abdomen is soft.     Tenderness: There is abdominal tenderness in the left lower quadrant.  Genitourinary:    Comments: Exam performed by ,  exam chaperoned Date: 10/13/2021 Pelvic exam: normal external genitalia without evidence of trauma. VULVA: normal appearing vulva with no masses, tenderness or lesion. VAGINA: normal appearing vagina with normal color and discharge, no lesions. Vaginal bleeding present on exam. CERVIX: normal appearing cervix without lesions, cervical motion tenderness absent, cervical os closed with out purulent discharge; vaginal discharge - clear, Wet prep and DNA probe for chlamydia and GC obtained.   ADNEXA: normal adnexa in size and no masses, left adnexal TTP present UTERUS: uterus is normal size, shape, consistency and nontender.   Musculoskeletal:        General: No swelling.     Cervical back: Neck supple.  Skin:    General: Skin is  warm and dry.     Capillary Refill: Capillary refill takes less than 2 seconds.  Neurological:     Mental Status: She is alert.  Psychiatric:        Mood and Affect: Mood normal.    ED Results / Procedures / Treatments   Labs (all labs ordered are listed, but only abnormal results are displayed) Labs Reviewed  WET PREP, GENITAL - Abnormal; Notable for the following components:      Result Value   Trich, Wet Prep PRESENT (*)    All other components within normal limits  COMPREHENSIVE METABOLIC PANEL - Abnormal; Notable for the following components:   Calcium 8.5 (*)    Total Bilirubin 0.1 (*)    Anion gap 4 (*)    All other components within normal limits  URINALYSIS, ROUTINE W REFLEX MICROSCOPIC -  Abnormal; Notable for the following components:   APPearance CLOUDY (*)    Hgb urine dipstick LARGE (*)    Protein, ur 30 (*)    Leukocytes,Ua SMALL (*)    Bacteria, UA RARE (*)    All other components within normal limits  CBC WITH DIFFERENTIAL/PLATELET  LIPASE, BLOOD  POCT PREGNANCY, URINE  I-STAT BETA HCG BLOOD, ED ([facility], [facility], [facility] ONLY)  GC/CHLAMYDIA PROBE AMP ([place]) NOT AT [facility]    EKG None  Radiology CT ABDOMEN PELVIS W CONTRAST  Result Date: 10/12/2021 CLINICAL DATA:  Acute nonlocalized abdominal pain. Nausea and vaginal bleeding. EXAM: CT ABDOMEN AND PELVIS WITH CONTRAST TECHNIQUE: Multidetector CT imaging of the abdomen and pelvis was performed using the standard protocol following bolus administration of intravenous contrast. CONTRAST:  121mL OMNIPAQUE IOHEXOL 300 MG/ML  SOLN COMPARISON:  Chest radiograph 03/04/2021 FINDINGS: Lower chest: Lung bases are clear.  Small esophageal hiatal hernia. Hepatobiliary: No focal liver abnormality is seen. No gallstones, gallbladder wall thickening, or biliary dilatation. Pancreas: Unremarkable. No pancreatic ductal dilatation or surrounding inflammatory changes. Spleen: Normal in size without focal abnormality. Adrenals/Urinary  Tract: Adrenal glands are unremarkable. Kidneys are normal, without renal calculi, focal lesion, or hydronephrosis. Bladder is unremarkable. Stomach/Bowel: Stomach, small bowel, and colon are not abnormally distended. Stool throughout the colon. No wall thickening or inflammatory changes appreciated. Appendix is not identified. Vascular/Lymphatic: No significant vascular findings are present. No enlarged abdominal or pelvic lymph nodes. Reproductive: Uterus and bilateral adnexa are unremarkable. Small amount of free fluid in the pelvis is likely physiologic. Other: No free air in the abdomen. Abdominal wall musculature appears intact. Musculoskeletal: No acute or significant osseous findings. IMPRESSION: No acute process demonstrated in the abdomen or pelvis. Small amount of free fluid in the pelvis is likely physiologic. No bowel obstruction or inflammation. Electronically Signed   By: Lucienne Capers M.D.   On: 10/12/2021 21:29   US PELVIC COMPLETE W TRANSVAGINAL AND TORSION R/O  Result Date: 10/13/2021 CLINICAL DATA:  Left adnexal tenderness, abdominal pain, vaginal bleeding EXAM: TRANSABDOMINAL AND TRANSVAGINAL ULTRASOUND OF PELVIS DOPPLER ULTRASOUND OF OVARIES TECHNIQUE: Both transabdominal and transvaginal ultrasound examinations of the pelvis were performed. Transabdominal technique was performed for global imaging of the pelvis including uterus, ovaries, adnexal regions, and pelvic cul-de-sac. It was necessary to proceed with endovaginal exam following the transabdominal exam to visualize the endometrium and adnexal structures. Color and duplex Doppler ultrasound was utilized to evaluate blood flow to the ovaries. COMPARISON:  10/12/2021 FINDINGS: Uterus Measurements: 7.2 x 3.2 x 3.6 cm = volume: 42.6 mL. No fibroids or other mass visualized. Endometrium Thickness: 5 mm.  No focal abnormality visualized. Right ovary Measurements: 3.3 x 1.8 x 2.1 cm = volume: 6.5 mL. Normal appearance/no adnexal mass.  Left ovary Measurements: 3.2 x 3.0 x 3.0 cm = volume: 15.1 mL. 1.6 x 2.0 x 1.8 cm complex cystic structure within the left ovary may reflect a ruptured follicle. Pulsed Doppler evaluation of both ovaries demonstrates normal low-resistance arterial and venous waveforms. Other findings Trace pelvic free fluid within the cul-de-sac. IMPRESSION: 1. 2 cm complex cystic structure left ovary, likely a ruptured follicle. 2. Trace pelvic free fluid, likely physiologic. 3. Otherwise unremarkable exam. Electronically Signed   By: Randa Ngo M.D.   On: 10/13/2021 01:18    Procedures Procedures   Medications Ordered in ED Medications  iohexol (OMNIPAQUE) 300 MG/ML solution 100 mL (100 mLs Intravenous Contrast Given 10/12/21 2105)  ibuprofen (ADVIL) tablet 600 mg (600 mg  Oral Given 10/13/21 0021)  HYDROcodone-acetaminophen (NORCO/VICODIN) 5-325 MG per tablet 1 tablet (1 tablet Oral Given 10/13/21 0146)    ED Course  I have reviewed the triage vital signs and the nursing notes.  Pertinent labs & imaging results that were available during my care of the patient were reviewed by me and considered in my medical decision making (see chart for details).    MDM Rules/Calculators/A&P                          30 y/o female presents to the ED today for eval of vaginal bleeding and pelvic pain  Reviewed/interpreted labs CBC w/o leukocytosis or anemia CMP w/o electrolyte abnormalities, normal kidney and liver function Lipase negative UA not suggestive of UTI Pregnancy test negative Wet prep positive for trich  - rx for flagyl sent Gc/chlamydia pending at the time of discharge   Reviewed/interpreted imaging Ct abd/pelvis - No acute process demonstrated in the abdomen or pelvis. Small amount of free fluid in the pelvis is likely physiologic. No bowel obstruction or inflammation Pelvic US - : 1. 2 cm complex cystic structure left ovary, likely a ruptured follicle. 2. Trace pelvic free fluid, likely  physiologic. 3. Otherwise unremarkable exam  Pt pain managed in the emergency department.  Pain likely related to ovarian cyst.  Doubt PID, Rx for Flagyl and pain meds sent.  Advised to follow-up with OB/GYN, referral given.  Advised on return precautions.  She and caretaker at bedside voiced understanding of the plan and reasons to return.  All questions answered.  Patient stable for discharge.   Final Clinical Impression(s) / ED Diagnoses Final diagnoses:  Left adnexal tenderness  Cyst of left ovary  Vaginal bleeding    Rx / DC Orders ED Discharge Orders          Ordered    naproxen (NAPROSYN) 500 MG tablet  2 times daily        10/13/21 0135    metroNIDAZOLE (FLAGYL) 500 MG tablet  2 times daily        10/13/21 0206             Rodney Booze, PA-C 10/13/21 0209    Veryl Speak, MD 10/13/21 [phone]

## 2021-10-16 LAB — GC/CHLAMYDIA PROBE AMP (~~LOC~~) NOT AT ARMC
Chlamydia: NEGATIVE
Comment: NEGATIVE
Comment: NORMAL
Neisseria Gonorrhea: NEGATIVE

## 2021-11-23 ENCOUNTER — Ambulatory Visit (HOSPITAL_COMMUNITY)
Admission: EM | Admit: 2021-11-23 | Discharge: 2021-11-23 | Disposition: A | Discharge status: Home / Self Care | Payer: 59 | Attending: Behavioral Health | Admitting: Behavioral Health

## 2021-11-23 DIAGNOSIS — F319 Bipolar disorder, unspecified: Secondary | ICD-10-CM | POA: Diagnosis not present

## 2021-11-23 DIAGNOSIS — R45851 Suicidal ideations: Secondary | ICD-10-CM | POA: Insufficient documentation

## 2021-11-23 NOTE — ED Provider Notes (Signed)
Behavioral Health Urgent Care Medical Screening Exam  Patient Name: Ryenne Lynam MRN: [id number] Date of Evaluation: 11/23/21 Chief Complaint:   Diagnosis:  Final diagnoses:  Suicidal ideation    History of Present illness: Matrice Herro is a 31 y.o. female patient who presents to the [facility] behavioral health urgent care voluntarily as a walk-in accompanied by [employer] with complaints of passive suicidal thoughts. Patient has a past psych hx significant for "schizophrenia and bipolar."  Patient seen and evaluated face-to-face by this provider, chart reviewed and case discussed with Dr. Lucianne Muss.  On evaluation, patient is alert and oriented x4. Her thought process is logical and relevant. Her speech is clear and coherent. Her mood is dysphoric and affect is congruent.  Patient states that she wants to go to a new group home or assisted living. She states that she currently lives [facility]. She states that she has been living at [facility] group on for 2 years. She states that she does not like it there and feels like a prisoner. She states that she cannot talk on the phone, cannot go anywhere, and cannot do activities. She states that she has not been able to get in contact with her aunt [name] who is her legal guardian for the past 2 or 3 years. She states that she wants to relocate [place], [place] and [place]. Patient is requesting to speak to a social worker to help facilitate her moving to a new group home or assisted living.  Patient reports passive suicidal ideations yesterday with no plan or intent because she feels like a prisoner at the group home. Today, she denies having suicidal ideations. She denies homicidal ideations. She denies auditory and visual hallucinations. There is no objective evidence that the patient is currently responding to internal or external stimuli.  This provider attempted to contact the patient's legal guardian [name] [phone],  this is not a working number.   With the patient's verbal consent this provider contacted Ms. Josephine [phone] the group home owner via telephone. Ms. Julieanne Cotton states that she does not have a number for the patient's legal guardian [name]. She states that the patient's legal guardian does not want to have anything to do with the patient due to her behaviors. She states that the legal guardian dumped the patient off all her 2 years ago and hasn't had anything to do with her. She states that the patient tries to bring men into the group home and wants to live an independent lifestyle. She states that each day the patient goes to a day program Monday through Friday and on the weekends they go on outing and to church on Sunday. Ms. Julieanne Cotton denies any safety concerns with the patient returning home this evening.  I consulted with [place] CSW, via telephone regarding the patient's request to speak with a social worker to assist with placement. [place], recommends that the patient follow up with DSS and filed a claim. The patient was provided with the contact information to contact his DSS. Patient verbalizes understanding and agrees to the stated plan.    Psychiatric Specialty Exam  Presentation  General Appearance:Meticulous; Fairly Groomed  Eye Contact:Fair  Speech:Clear and Coherent  Speech Volume:Normal  Handedness:No data recorded  Mood and Affect  Mood:Dysphoric  Affect:Congruent   Thought Process  Thought Processes:Coherent; Goal Directed; Linear  Descriptions of Associations:Intact  Orientation:Full (Time, Place and Person)  Thought Content:Logical    Hallucinations:None  Ideas of Reference:No data recorded Suicidal  Thoughts:No  Homicidal Thoughts:No   Sensorium  Memory:Immediate Fair; Remote Fair; Recent Fair  Judgment:Fair  Insight:Fair   Executive Functions  Concentration:Fair  Attention Span:Fair  Recall:Fair  [employer] of  Knowledge:Fair  Language:Fair   Psychomotor Activity  Psychomotor Activity:Normal   Assets  Assets:Communication Skills; Desire for Improvement; Housing; Leisure Time; Physical Health; Social Support   Sleep  Sleep:Fair   Physical Exam: Physical Exam Constitutional:      Appearance: Normal appearance.  HENT:     Head: Normocephalic.     Nose: Nose normal.  Eyes:     Conjunctiva/sclera: Conjunctivae normal.  Cardiovascular:     Rate and Rhythm: Normal rate.  Pulmonary:     Effort: Pulmonary effort is normal.  Musculoskeletal:        General: Normal range of motion.     Cervical back: Normal range of motion.  Neurological:     Mental Status: She is alert and oriented to person, place, and time.   Review of Systems  Constitutional: Negative.   HENT: Negative.    Eyes: Negative.   Respiratory: Negative.    Cardiovascular: Negative.   Gastrointestinal: Negative.   Genitourinary: Negative.   Musculoskeletal: Negative.   Skin: Negative.   Neurological: Negative.   Endo/Heme/Allergies: Negative.   Blood pressure 123/89, pulse 93, temperature 98.6 F (37 C), temperature source Oral, resp. rate 18, SpO2 100 %. There is no height or weight on file to calculate BMI.  Musculoskeletal: Strength & Muscle Tone: within normal limits Gait & Station: normal Patient leans: N/A   [facility] MSE Discharge Disposition for Follow up and Recommendations: Based on my evaluation the patient does not appear to have an emergency medical condition and can be discharged with resources and follow up care in outpatient services for Medication Management, Individual Therapy, and Group Therapy   Layla Barter, NP 11/23/2021, 6:20 PM

## 2021-11-23 NOTE — ED Triage Notes (Signed)
Pt Nicole [name] presents to [facility] escorted by [employer] with a complaint of passive SI. Pt states " I feel like a prisoner". Pt states that she has been living in a grouphome for the past 2 years and she does not have any freedom at this placement and her every move is controlled. Pt states that she was diagnosed with Bipolar and Schizophrenia. Pt states that she has a psychiatrist at [facility] and she is compliant with her medication. Pt states that it would be helpful to have resources for a new housing placement and she would like to speak with a SW. Pt states that she sometimes wants to harm herself  because of her situation but she does not have a plan. Pt denies SI at this time. Pt denies HI and AVH. Pt is routine.

## 2021-11-23 NOTE — Discharge Instructions (Addendum)

## 2021-11-23 NOTE — ED Notes (Signed)
Pt given AVS and all questions answered fully. [employer] picked up pt to transport back to group home.

## 2021-11-29 ENCOUNTER — Inpatient Hospital Stay (HOSPITAL_COMMUNITY)
Admission: AD | Admit: 2021-11-29 | Discharge: 2021-11-29 | Disposition: A | Discharge status: Home / Self Care | Payer: 59 | Attending: Obstetrics & Gynecology | Admitting: Obstetrics & Gynecology

## 2021-11-29 ENCOUNTER — Other Ambulatory Visit: Payer: Self-pay

## 2021-11-29 ENCOUNTER — Ambulatory Visit (HOSPITAL_COMMUNITY): Admission: EM | Admit: 2021-11-29 | Discharge: 2021-11-29 | Discharge status: Against Medical Advice | Payer: 59

## 2021-11-29 DIAGNOSIS — N926 Irregular menstruation, unspecified: Secondary | ICD-10-CM | POA: Insufficient documentation

## 2021-11-29 LAB — POCT PREGNANCY, URINE: Preg Test, Ur: NEGATIVE

## 2021-11-29 NOTE — MAU Note (Signed)
Presents stating she missed her cycle and is having breast/nipple pain and lower & upper back pain.  Hasn't taken a HPT.  LMP 10/26/2021.

## 2021-11-29 NOTE — MAU Provider Note (Signed)
°  S:   31 y.o. No obstetric history on file. @Unknown  by LMP presents to MAU for pregnancy confirmation.  She denies abdominal pain or vaginal bleeding today.    O: BP (!) 132/94 (BP Location: Right Arm)    Pulse (!) 114    Temp 98.4 F (36.9 C) (Oral)    Resp 18    Ht 5\' 1"  (1.549 m)    Wt 89.9 kg    LMP 10/26/2021    SpO2 100%    BMI 37.47 kg/m  Physical Examination: General appearance - alert, well appearing, and in no distress, oriented to person, place, and time and acyanotic, in no respiratory distress  Results for orders placed or performed during the hospital encounter of 11/29/21 (from the past 48 hour(s))  Pregnancy, urine POC     Status: None   Collection Time: 11/29/21  1:03 PM  Result Value Ref Range   Preg Test, Ur NEGATIVE NEGATIVE    Comment:        THE SENSITIVITY OF THIS METHODOLOGY IS >24 mIU/mL     A: Missed menses  P: D/C home Informed pt we do not do pregnancy verifications in MAU Referred to [facility] for pregnancy test & verification Return to MAU as needed for pregnancy related emergencies  [facility], NP 1:17 PM

## 2021-12-02 ENCOUNTER — Other Ambulatory Visit: Payer: Self-pay

## 2021-12-02 ENCOUNTER — Emergency Department (HOSPITAL_COMMUNITY)
Admission: EM | Admit: 2021-12-02 | Discharge: 2021-12-03 | Disposition: A | Discharge status: Home / Self Care | Payer: 59 | Attending: Emergency Medicine | Admitting: Emergency Medicine

## 2021-12-02 ENCOUNTER — Encounter (HOSPITAL_COMMUNITY): Payer: Self-pay

## 2021-12-02 DIAGNOSIS — F17200 Nicotine dependence, unspecified, uncomplicated: Secondary | ICD-10-CM | POA: Diagnosis not present

## 2021-12-02 DIAGNOSIS — R110 Nausea: Secondary | ICD-10-CM | POA: Insufficient documentation

## 2021-12-02 DIAGNOSIS — R109 Unspecified abdominal pain: Secondary | ICD-10-CM | POA: Diagnosis not present

## 2021-12-02 DIAGNOSIS — M545 Low back pain, unspecified: Secondary | ICD-10-CM | POA: Diagnosis present

## 2021-12-02 DIAGNOSIS — R945 Abnormal results of liver function studies: Secondary | ICD-10-CM | POA: Diagnosis not present

## 2021-12-02 DIAGNOSIS — M62830 Muscle spasm of back: Secondary | ICD-10-CM | POA: Insufficient documentation

## 2021-12-02 DIAGNOSIS — M62838 Other muscle spasm: Secondary | ICD-10-CM

## 2021-12-02 LAB — CBC WITH DIFFERENTIAL/PLATELET
Abs Immature Granulocytes: 0.02 10*3/uL (ref 0.00–0.07)
Basophils Absolute: 0 10*3/uL (ref 0.0–0.1)
Basophils Relative: 0 %
Eosinophils Absolute: 0 10*3/uL (ref 0.0–0.5)
Eosinophils Relative: 0 %
HCT: 38.6 % (ref 36.0–46.0)
Hemoglobin: 12.8 g/dL (ref 12.0–15.0)
Immature Granulocytes: 0 %
Lymphocytes Relative: 61 %
Lymphs Abs: 3.8 10*3/uL (ref 0.7–4.0)
MCH: 30.2 pg (ref 26.0–34.0)
MCHC: 33.2 g/dL (ref 30.0–36.0)
MCV: 91 fL (ref 80.0–100.0)
Monocytes Absolute: 0.3 10*3/uL (ref 0.1–1.0)
Monocytes Relative: 5 %
Neutro Abs: 2.1 10*3/uL (ref 1.7–7.7)
Neutrophils Relative %: 34 %
Platelets: 232 10*3/uL (ref 150–400)
RBC: 4.24 MIL/uL (ref 3.87–5.11)
RDW: 12.9 % (ref 11.5–15.5)
WBC: 6.3 10*3/uL (ref 4.0–10.5)
nRBC: 0 % (ref 0.0–0.2)

## 2021-12-02 LAB — COMPREHENSIVE METABOLIC PANEL
ALT: 18 U/L (ref 0–44)
AST: 14 U/L — ABNORMAL LOW (ref 15–41)
Albumin: 3.5 g/dL (ref 3.5–5.0)
Alkaline Phosphatase: 60 U/L (ref 38–126)
Anion gap: 6 (ref 5–15)
BUN: 10 mg/dL (ref 6–20)
CO2: 24 mmol/L (ref 22–32)
Calcium: 8.8 mg/dL — ABNORMAL LOW (ref 8.9–10.3)
Chloride: 105 mmol/L (ref 98–111)
Creatinine, Ser: 0.87 mg/dL (ref 0.44–1.00)
GFR, Estimated: >60 mL/min (ref >60)
Glucose, Bld: 85 mg/dL (ref 70–99)
Potassium: 4.1 mmol/L (ref 3.5–5.1)
Sodium: 135 mmol/L (ref 135–145)
Total Bilirubin: <0.1 mg/dL — ABNORMAL LOW (ref 0.3–1.2)
Total Protein: 6.8 g/dL (ref 6.5–8.1)

## 2021-12-02 LAB — URINALYSIS, ROUTINE W REFLEX MICROSCOPIC
Bilirubin Urine: NEGATIVE
Glucose, UA: NEGATIVE mg/dL
Hgb urine dipstick: NEGATIVE
Ketones, ur: NEGATIVE mg/dL
Leukocytes,Ua: NEGATIVE
Nitrite: NEGATIVE
Protein, ur: NEGATIVE mg/dL
Specific Gravity, Urine: >1.03 — ABNORMAL HIGH (ref 1.005–1.030)
pH: 6 (ref 5.0–8.0)

## 2021-12-02 LAB — LIPASE, BLOOD: Lipase: 30 U/L (ref 11–51)

## 2021-12-02 LAB — HCG, SERUM, QUALITATIVE: Preg, Serum: NEGATIVE

## 2021-12-02 MED ORDER — ONDANSETRON HCL 4 MG/2ML IJ SOLN
4.0000 mg | Freq: Once | INTRAMUSCULAR | Status: AC
  Administered 2021-12-02: 4 mg via INTRAVENOUS
  Filled 2021-12-02: qty 2

## 2021-12-02 MED ORDER — LACTATED RINGERS IV BOLUS
1000.0000 mL | Freq: Once | INTRAVENOUS | Status: AC
  Administered 2021-12-02: 1000 mL via INTRAVENOUS

## 2021-12-02 MED ORDER — IBUPROFEN 600 MG PO TABS: 600.0000 mg | ORAL_TABLET | Freq: Four times a day (QID) | ORAL | 0 refills | Status: DC | PRN

## 2021-12-02 MED ORDER — KETOROLAC TROMETHAMINE 15 MG/ML IJ SOLN
15.0000 mg | Freq: Once | INTRAMUSCULAR | Status: AC
  Administered 2021-12-02: 15 mg via INTRAVENOUS
  Filled 2021-12-02: qty 1

## 2021-12-02 MED ORDER — METHOCARBAMOL 500 MG PO TABS: 500.0000 mg | ORAL_TABLET | Freq: Two times a day (BID) | ORAL | 0 refills | Status: DC

## 2021-12-02 NOTE — ED Notes (Signed)
Called [facility] to take patient to [facility]. [address] [place]

## 2021-12-02 NOTE — ED Provider Notes (Signed)
[place] [facility] EMERGENCY DEPARTMENT Provider Note   CSN: [id number] Arrival date & time: 12/02/21  1745     History Chief Complaint  Patient presents with   Abdominal Pain    Nicole [name] is a 31 y.o. female without any past medical history presents emerged department for evaluation of diffuse abdominal pain x1 week.  She reports it feels like contraction cramping.  She reports she has not had her period since 10-26-2021 and is concerned for pregnancy.  She reports she is had some nausea, but no vomiting.  She denies any constipation or diarrhea.  Denies any fever, chest pain, shortness of breath, vaginal bleeding, vaginal discharge, hematuria, dysuria.  She does report some lower back pain, mainly on the left, as well.  She reports she was trying to pull someone through a window and noticed the pain started then, but denies any falls or major traumas.  She is a daily smoker.  Denies any EtOH or illicit drug use.  She denies any medical, surgical history or any daily medications.   Abdominal Pain     Home Medications Prior to Admission medications   Medication Sig Start Date End Date Taking? Authorizing Provider  ABILIFY MAINTENA 400 MG PRSY prefilled syringe SMARTSIG:1 Each IM Once a Month Patient not taking: Reported on 10/13/2021 08/10/21   [provider]  benzonatate (TESSALON) 100 MG capsule Take 1 capsule (100 mg total) by mouth every 8 (eight) hours. Patient not taking: Reported on 10/13/2021 03/04/21   Sherrill Raring, PA-C  fluticasone [facility]) 50 MCG/ACT nasal spray Place into both nostrils. 08/22/21   [provider]  hydrOXYzine (ATARAX/VISTARIL) 50 MG tablet Take 50 mg by mouth 2 (two) times daily. 07/28/21   [provider]  IBU 800 MG tablet Take 800 mg by mouth 2 (two) times daily. 06/27/21   [provider]  meloxicam (MOBIC) 15 MG tablet Take 15 mg by mouth daily. 08/22/21   [provider]  metroNIDAZOLE  (FLAGYL) 500 MG tablet Take 500 mg by mouth 2 (two) times daily. Patient not taking: Reported on 10/13/2021 06/15/21   [provider]  nicotine (NICODERM CQ - DOSED IN MG/24 HOURS) 21 mg/24hr patch daily. Patient not taking: Reported on 10/13/2021 07/19/21   [provider]  ondansetron (ZOFRAN) 4 MG tablet Take 1 tablet (4 mg total) by mouth every 6 (six) hours. Patient not taking: Reported on 10/13/2021 07/13/21   Lennice Sites, DO  Oxcarbazepine (TRILEPTAL) 300 MG tablet Take 300 mg by mouth 2 (two) times daily. 08/22/21   [provider]  OZEMPIC, 0.25 OR 0.5 MG/DOSE, 2 MG/1.5ML SOPN SMARTSIG:0.5 Milligram(s) Topical Once a Week Patient not taking: Reported on 10/13/2021 06/28/21   [provider]  QUEtiapine (SEROQUEL) 400 MG tablet Take 400 mg by mouth 2 (two) times daily. 08/22/21   [provider]  sertraline (ZOLOFT) 50 MG tablet Take 150 mg by mouth daily. 08/22/21   [provider]  traZODone (DESYREL) 100 MG tablet Take 100 mg by mouth at bedtime. 08/22/21   [provider]  valACYclovir (VALTREX) 500 MG tablet Take 500 mg by mouth daily. 08/11/21   [provider]      Allergies    Patient has no known allergies.    Review of Systems   Review of Systems  Gastrointestinal:  Positive for abdominal pain.  Musculoskeletal:  Positive for back pain.   See HPI.  Physical Exam Updated Vital Signs BP 114/75    Pulse  88    Temp 98.3 F (36.8 C) (Oral)    Resp 15    Ht 5\' 1"  (1.549 m)    Wt 89.8 kg    SpO2 100%    BMI 37.41 kg/m  Physical Exam Vitals and nursing note reviewed.  Constitutional:      General: She is not in acute distress.    Appearance: Normal appearance. She is not toxic-appearing.  HENT:     Head: Normocephalic and atraumatic.  Eyes:     General: No scleral icterus. Cardiovascular:     Rate and Rhythm: Normal rate and regular rhythm.  Pulmonary:     Effort: Pulmonary effort is normal. No  respiratory distress.     Breath sounds: Normal breath sounds.  Abdominal:     General: Bowel sounds are normal.     Palpations: Abdomen is soft.     Tenderness: There is no abdominal tenderness. There is no right CVA tenderness, left CVA tenderness, guarding or rebound.     Comments: Abdominal exam limited secondary to body habitus.  No overlying skin changes noted.  Abdomen soft, nondistended.  Normal active bowel sounds noted.  No tenderness to palpation.  No guarding or rebound noted.  No CVA tenderness bilaterally.  Genitourinary:    Comments: Patient declined. Musculoskeletal:        General: No deformity.     Cervical back: Normal range of motion.     Comments: Palpable muscle spasm on the left lumbar back.  No overlying erythema or warmth to the area.  No midline cervical, thoracic, lumbar tenderness.  No bony step-offs or deformities noted.  Has equal strength in upper and lower bilateral extremities compartments are soft.  DP and PT and radial pulses palpable.  Sensation intact throughout.  Skin:    General: Skin is warm and dry.  Neurological:     General: No focal deficit present.     Mental Status: She is alert. Mental status is at baseline.     Cranial Nerves: No cranial nerve deficit.     Motor: No weakness.    ED Results / Procedures / Treatments   Labs (all labs ordered are listed, but only abnormal results are displayed) Labs Reviewed  URINALYSIS, ROUTINE W REFLEX MICROSCOPIC - Abnormal; Notable for the following components:      Result Value   APPearance HAZY (*)    Specific Gravity, Urine >1.030 (*)    Bacteria, UA RARE (*)    All other components within normal limits  COMPREHENSIVE METABOLIC PANEL - Abnormal; Notable for the following components:   Calcium 8.8 (*)    AST 14 (*)    Total Bilirubin <0.1 (*)    All other components within normal limits  CBC WITH DIFFERENTIAL/PLATELET  LIPASE, BLOOD  HCG, SERUM, QUALITATIVE    EKG EKG  Interpretation  Date/Time:  Sunday December 02 2021 18:01:49 EST Ventricular Rate:  92 PR Interval:  155 QRS Duration: 83 QT Interval:  359 QTC Calculation: 445 R Axis:   42 Text Interpretation: Sinus rhythm Confirmed by Octaviano Glow [phone]) on 12/02/2021 8:34:28 PM  Radiology No results found.  Procedures Procedures   Medications Ordered in ED Medications  lactated ringers bolus 1,000 mL (1,000 mLs Intravenous New Bag/Given 12/02/21 1946)  ondansetron (ZOFRAN) injection 4 mg (4 mg Intravenous Given 12/02/21 1947)    ED Course/ Medical Decision Making/ A&P  Medical Decision Making Amount and/or Complexity of Data Reviewed Labs: ordered.  Risk Prescription drug management.   31 year old female presents emerged department for evaluation of diffuse abdominal cramping intermittently for the past week as well as lower back pain, mainly on the left.  Differential diagnosis includes was not limited to cauda equina, gastroenteritis, pregnancy, dehydration, UTI, renal stone, diverticulitis.  Vital signs show mild hypertension at 125/99, otherwise normal temperature, normal pulse rate, satting well on room air.  Physical exam shows a nontender, soft, normal active bowel sounds abdomen without any acute overlying skin changes.  She has a palpable muscle spasm on the left without any overlying warmth or erythema.  Tender to palpation at the area.  I independently reviewed and interpreted the patient's labs.  CBC shows no leukocytosis or anemia present.  Urinalysis shows hazy urine that is concentrated with rare bacteria although there is 21-50 squamous epithelial is present most likely dirty catch.  CMP shows no electrolyte abnormalities other than a mild decrease in calcium at 8.8.  Mild decrease in AST at 14.  Lipase is normal.  Pregnancy negative.  The patient received 1 L bolus of LR, Zofran, and Toradol.  She has eaten both a peanut and jelly sandwich as well  as a hamburger that she brought with her in a Ziploc bag without any nausea or emesis.  On reevaluation, the patient is sleeping, I discussed with her the lab and imaging findings.  She reports she is feeling much better and would like to go home.  She was concerned that her pregnancy test would have been positive due to her late menstrual cycle as she was seen by the doctor previously and they told her that it was negative as well as she was afraid it was too early to tell.  LMP was 10-26-2021.  At this time, low suspicion for any cauda equina given the reassuring neuro exam.  No focal deficit.  Patient likely mildly dehydrated due to the concentration of her urine, but no electrolyte abnormalities.  Can obtain p.o. fluids at home.  I have low suspicion for any renal stone given the normal urinalysis without any blood or leukocytes.  Low suspicion for any diverticulitis given the improvement of pain without any fever, diarrhea, constipation, or active vomiting.  No leukocytosis.  At this time, I do not think any imaging is warranted due to the patient's improvement of pain with the medications and fluids.  Her abdomen is nontender, nondistended, soft, without any overlying skin changes.  She has eaten plenty without any nausea, emesis, or pain.  Patient reports she is feeling better and would like to go home.  We will discharge her home on ibuprofen 600 mg as well as Robaxin for her muscle spasm of her back.  Recommended follow-up with her PCP in 1 week if she does not have any improvement of her symptoms.  Back pain red flag symptoms discussed with her.  Patient agrees to plan.  Patient is stable and being discharged home in good condition.  Final Clinical Impression(s) / ED Diagnoses Final diagnoses:  Muscle spasm    Rx / DC Orders ED Discharge Orders     None         Sherrell Puller, Hershal Coria 12/06/21 2215    Wyvonnia Dusky, MD 12/07/21 0030

## 2021-12-02 NOTE — ED Notes (Signed)
Entered patients room and Pt eating peanut butter sandwich without difficulty. No vomiting noted

## 2021-12-02 NOTE — ED Triage Notes (Signed)
Pt arrives via [facility] for abdominal cramping and lower back pain. Pt reportedly has missed one menstrual cycle.   EMS last VS - 118/70, HR 74, RR 16, 100% on RA, 98.7 temp.

## 2021-12-02 NOTE — Discharge Instructions (Addendum)
You were seen here today for evaluation of you abdominal pain and back pain. You declined any imaging for your abdomen. I think this pain is likely coming from the muscle spasm on the left side of your back. Your lab work was normal and it does not show that your are pregnant. I have sent in prescriptions of ibuprofen 600mg  and Robaxin to take as needed for pain. Please do not take if there is a chance of pregnancy. The Robaxin may make you sleepy so please do no drive or operate heavy machinery while on this medication.  You have any worsening back pain, defecating or urinating on yourself, have not peed in over 12 hours, fevers, worsening abdominal pain, nausea, vomiting, please return to the nearest emergency department for evaluation.  Please follow up with your PCP in one week if your symptoms have not improved.

## 2021-12-02 NOTE — ED Notes (Signed)
Patient denies pain and is resting comfortably.  

## 2021-12-03 NOTE — ED Notes (Signed)
Pt escorted to safe transport back to group home. Pt put in safe ride home

## 2021-12-17 ENCOUNTER — Ambulatory Visit (INDEPENDENT_AMBULATORY_CARE_PROVIDER_SITE_OTHER): Payer: Medicaid Other | Admitting: Advanced Practice Midwife

## 2021-12-17 ENCOUNTER — Other Ambulatory Visit: Payer: Self-pay

## 2021-12-17 ENCOUNTER — Encounter: Payer: Self-pay | Admitting: Advanced Practice Midwife

## 2021-12-17 VITALS — BP 123/85 | HR 47 | Ht 61.0 in | Wt 198.8 lb

## 2021-12-17 DIAGNOSIS — Z975 Presence of (intrauterine) contraceptive device: Secondary | ICD-10-CM | POA: Insufficient documentation

## 2021-12-17 DIAGNOSIS — Z0000 Encounter for general adult medical examination without abnormal findings: Secondary | ICD-10-CM

## 2021-12-17 NOTE — Progress Notes (Signed)
Pt scheduled for new gyn/annual exam visit.  Pt has legal guardian and arrived for appt today with support person associated with her group home.  Pt denies any abdominal pain and reports normal light monthly periods with Nexplanon in place. She reports Nexplanon was placed 3 years ago and she desires removal and does not desire another form of contraception because she desires pregnancy.  Pt answers questions appropriately and without difficulty.   ? ?With hx legal guardian, and no hx in [facility] system in chart, I requested pt paperwork on guardianship.  She is not aware of any medical power of attorney paperwork.  She called and spoke with manager at her group home who states that the pt is able to make her own medical decisions, but she was not aware of any medical power of attorney or other supporting documentation. The manager at the group home reports that she made this appointment for the patient to follow up on abdominal pain and ovarian cysts that were seen in ED visit on 10/13/21.  Physiologic follicle on left ovary seen on [place] on 10/13/21.  Pt denies any current pain.   ? ?Pt to obtain legal guardianship paperwork and speak with her family about medical power of attorney. If Ms. Picco is able to make her own medical decisions, and her interaction with me today would support that as well, I will remove her Nexplanon as desired with counseling about planning pregnancy.   ? ?Appt was rescheduled in 2 weeks for annual/Pap/Nexplanon removal.  No physical exam was performed today.  ?

## 2021-12-17 NOTE — Progress Notes (Signed)
New patient presents for AEX and nexplanon removal. Does not desire birth control. Does not recall last pap smear. Desires STD screen, unsure about blood work.  ? ?Patient notably drowsy during nurse intake.  ? ?[id number] ?

## 2021-12-25 ENCOUNTER — Ambulatory Visit: Payer: 59 | Admitting: Advanced Practice Midwife

## 2022-02-07 ENCOUNTER — Ambulatory Visit (HOSPITAL_COMMUNITY)
Admission: EM | Admit: 2022-02-07 | Discharge: 2022-02-08 | Disposition: A | Discharge status: Home / Self Care | Payer: Medicaid Other | Attending: Psychiatry | Admitting: Psychiatry

## 2022-02-07 DIAGNOSIS — Z20822 Contact with and (suspected) exposure to covid-19: Secondary | ICD-10-CM | POA: Insufficient documentation

## 2022-02-07 DIAGNOSIS — F339 Major depressive disorder, recurrent, unspecified: Secondary | ICD-10-CM | POA: Insufficient documentation

## 2022-02-07 DIAGNOSIS — R440 Auditory hallucinations: Secondary | ICD-10-CM | POA: Insufficient documentation

## 2022-02-07 DIAGNOSIS — F259 Schizoaffective disorder, unspecified: Secondary | ICD-10-CM | POA: Insufficient documentation

## 2022-02-07 DIAGNOSIS — R45850 Homicidal ideations: Secondary | ICD-10-CM | POA: Insufficient documentation

## 2022-02-07 DIAGNOSIS — F17210 Nicotine dependence, cigarettes, uncomplicated: Secondary | ICD-10-CM | POA: Insufficient documentation

## 2022-02-07 LAB — POCT URINE DRUG SCREEN - MANUAL ENTRY (I-SCREEN)
POC Amphetamine UR: NOT DETECTED
POC Buprenorphine (BUP): NOT DETECTED
POC Cocaine UR: NOT DETECTED
POC Marijuana UR: NOT DETECTED
POC Methadone UR: NOT DETECTED
POC Methamphetamine UR: NOT DETECTED
POC Morphine: NOT DETECTED
POC Oxazepam (BZO): NOT DETECTED
POC Oxycodone UR: NOT DETECTED
POC Secobarbital (BAR): NOT DETECTED

## 2022-02-07 LAB — POC SARS CORONAVIRUS 2 AG: SARSCOV2ONAVIRUS 2 AG: NEGATIVE

## 2022-02-07 LAB — POCT PREGNANCY, URINE: Preg Test, Ur: NEGATIVE

## 2022-02-07 MED ORDER — MAGNESIUM HYDROXIDE 400 MG/5ML PO SUSP: 30.0000 mL | Freq: Every day | ORAL | Status: DC | PRN

## 2022-02-07 MED ORDER — ACETAMINOPHEN 325 MG PO TABS: 650.0000 mg | ORAL_TABLET | Freq: Four times a day (QID) | ORAL | Status: DC | PRN

## 2022-02-07 MED ORDER — ALUM & MAG HYDROXIDE-SIMETH 200-200-20 MG/5ML PO SUSP: 30.0000 mL | ORAL | Status: DC | PRN

## 2022-02-07 NOTE — ED Notes (Signed)
Pt A&O x 4, presents with auditory and visual hallucinations.  Pt currently resides in a group home.  Seeing things & hearing voices.  Voices tell her to hurt people.  Pt calm & cooperative, no distress noted.  Monitoring for safety. ?

## 2022-02-07 NOTE — BH Assessment (Signed)
Comprehensive Clinical Assessment (CCA) Note ? ?02/07/2022 ?Nicole [name] ?[id number] ? ?Chief Complaint:  ?Chief Complaint  ?Patient presents with  ? Hallucinations  ? Depression  ? ?Visit Diagnosis:   ?Acute psychosis ? ?Disposition: ?Per Sindy Guadeloupe, NP pt recommended for [facility] OBS unit.  ? ?Flowsheet Row ED from 02/07/2022 in [facility] ED from 12/02/2021 in [facility] EMERGENCY DEPARTMENT ED from 10/12/2021 in [facility] EMERGENCY DEPARTMENT  ?C-SSRS RISK CATEGORY No Risk No Risk No Risk  ? ?  ? ? ?The patient demonstrates the following risk factors for suicide: Chronic risk factors for suicide include: psychiatric disorder of depression, psychosis . Acute risk factors for suicide include: social withdrawal/isolation. Protective factors for this patient include: positive social support. Considering these factors, the overall suicide risk at this point appears to be low. Patient is not appropriate for outpatient follow up. ? ? ? ?CCA Screening, Triage and Referral (STR) ? ?Patient Reported Information ?How did you hear about [place]? Legal System ? ?What Is the Reason for Your Visit/Call Today? Nicole [name] was transported to [facility] by [employer] for evaluation of psychosis symptoms. Pt currently residing in group home. Pt reports that she is "seeing things" and "hearing voices". Pt states that the voices tell her to hurt people. Pt reports that this happens when she takes her medicine. Pt made the comment that it happens more when she "gets high off my medicine". Pt denies knowing the names of the medication.  Pt denies any psychiatric services but states that her PCP prescribes "sleep pills" that she takes regularly. Pt denies SI or HI. Pt denies any substance use other than smoking cigarettes. ? ?How Long Has This Been Causing You Problems? <Week ? ?What Do You Feel Would Help You the Most Today? Treatment for Depression or other mood problem ? ? ?Have You Recently  Had Any Thoughts About Hurting Yourself? No ? ?Are You Planning to Commit Suicide/Harm Yourself At This time? No ? ? ?Have you Recently Had Thoughts About Hurting Someone Nicole [name]? No ? ?Are You Planning to Harm Someone at This Time? No ? ?Explanation: No data recorded ? ?Have You Used Any Alcohol or Drugs in the Past 24 Hours? No ? ?How Long Ago Did You Use Drugs or Alcohol? No data recorded ?What Did You Use and How Much? No data recorded ? ?Do You Currently Have a Therapist/Psychiatrist? No ? ?Name of Therapist/Psychiatrist: No data recorded ? ?Have You Been Recently Discharged From Any Office Practice or Programs? No ? ?Explanation of Discharge From Practice/Program: No data recorded ? ?  ?CCA Screening Triage Referral Assessment ?Type of Contact: Face-to-Face ? ?Telemedicine Service Delivery:   ?Is this Initial or Reassessment? No data recorded ?Date Telepsych consult ordered in [facility]:  No data recorded ?Time Telepsych consult ordered in [facility]:  No data recorded ?Location of Assessment: [facility] Assessment Services ? ?Provider Location: [facility] Assessment Services ? ? ?Collateral Involvement: none ? ? ?Does Patient Have a Automotive engineer Guardian? No data recorded ?Name and Contact of Legal Guardian: No data recorded ?If Minor and Not Living with Parent(s), Who has Custody? No data recorded ?Is CPS involved or ever been involved? In the Past ? ?Is APS involved or ever been involved? Never ? ? ?Patient Determined To Be At Risk for Harm To Self or Others Based on Review of Patient Reported Information or Presenting Complaint? No ? ?Method: No data recorded ?Availability of Means: No data recorded ?Intent: No  data recorded ?Notification Required: No data recorded ?Additional Information for Danger to Others Potential: No data recorded ?Additional Comments for Danger to Others Potential: No data recorded ?Are There Guns or Other Weapons in Your Home? No data recorded ?Types of Guns/Weapons: No data recorded ?Are These  Weapons Safely Secured?                            No data recorded ?Who Could Verify You Are Able To Have These Secured: No data recorded ?Do You Have any Outstanding Charges, Pending Court Dates, Parole/Probation? No data recorded ?Contacted To Inform of Risk of Harm To Self or Others: No data recorded ? ? ?Does Patient Present under Involuntary Commitment? No ? ?IVC Papers Initial File Date: No data recorded ? ?[place] of Residence: [place] ? ? ?Patient Currently Receiving the Following Services: Medication Management ? ? ?Determination of Need: Emergent (2 hours) (Brought in [employer]) ? ? ?Options For Referral: Medication Management; BH Urgent Care; Outpatient Therapy; Inpatient Hospitalization ? ? ? ? ?CCA Biopsychosocial ?Patient Reported Schizophrenia/Schizoaffective Diagnosis in Past: No ? ? ?Strengths: No data recorded ? ?Mental Health Symptoms ?Depression:  Change in energy/activity; Hopelessness; Irritability; Difficulty Concentrating; Sleep (too much or little); Tearfulness; Fatigue ?  ?Duration of Depressive symptoms: Duration of Depressive Symptoms: Greater than two weeks ?  ?Mania:  Racing thoughts; Recklessness ?  ?Anxiety:   Worrying; Irritability; Difficulty concentrating; Fatigue ?  ?Psychosis:  Affective flattening/alogia/avolition; Hallucinations ?  ?Duration of Psychotic symptoms: Duration of Psychotic Symptoms: Less than six months ?  ?Trauma:  None ?  ?Obsessions:  None ?  ?Compulsions:  None ?  ?Inattention:  None ?  ?Hyperactivity/Impulsivity:  None ?  ?Oppositional/Defiant Behaviors:  None ?  ?Emotional Irregularity:  None ?  ?Other Mood/Personality Symptoms:  No data recorded  ? ?Mental Status Exam ?Appearance and self-care  ?Stature:  Average ?  ?Weight:  Overweight ?  ?Clothing:  Neat/clean ?  ?Grooming:  Normal ?  ?Cosmetic use:  Age appropriate ?  ?Posture/gait:  Normal ?  ?Motor activity:  Not Remarkable ?  ?Sensorium  ?Attention:  Normal ?  ?Concentration:  Normal ?  ?Orientation:  X5 ?   ?Recall/memory:  Normal ?  ?Affect and Mood  ?Affect:  Depressed; Flat ?  ?Mood:  Depressed ?  ?Relating  ?Eye contact:  Staring ?  ?Facial expression:  Depressed ?  ?Attitude toward examiner:  Cooperative ?  ?Thought and Language  ?Speech flow: Slow; Soft ?  ?Thought content:  Appropriate to Mood and Circumstances ?  ?Preoccupation:  None ?  ?Hallucinations:  Auditory; Visual ?  ?Organization:  No data recorded  ?Executive Functions  ?Fund of Knowledge:  Fair ?  ?Intelligence:  Average ?  ?Abstraction:  Functional ?  ?Judgement:  Impaired ?  ?Reality Testing:  Variable ?  ?Insight:  Gaps ?  ?Decision Making:  Impulsive ?  ?Social Functioning  ?Social Maturity:  Impulsive; Irresponsible ?  ?Social Judgement:  Heedless; Impropriety ?  ?Stress  ?Stressors:  Other (Comment) (Pt states that she feels like she "takes too much on" stress wise from friends and her housemates) ?  ?Coping Ability:  Overwhelmed; Exhausted ?  ?Skill Deficits:  Interpersonal ?  ?Supports:  Church; Friends/Service system ?  ? ? ?Religion: ?Religion/Spirituality ?Are You A Religious Person?: Yes ? ?Leisure/Recreation: ?Leisure / Recreation ?Do You Have Hobbies?: Yes ?Leisure and Hobbies: texting and calling friends and loved ones ? ?Exercise/Diet: ?Exercise/Diet ?Do You  Exercise?: No ?Have You Gained or Lost A Significant Amount of Weight in the Past Six Months?: No ?Do You Follow a Special Diet?: No ?Do You Have Any Trouble Sleeping?: Yes ?Explanation of Sleeping Difficulties: sometimes but sleeps well most of the time ? ? ?CCA Employment/Education ?Employment/Work Situation: ?Employment / Work Situation ?Employment Situation: Unemployed ?Has Patient ever Been in the Military?: No ? ?Education: ?Education ?Is Patient Currently Attending School?: No ?Did You Attend College?: No ?Did You Have An Individualized Education Program (IIEP): No ?Did You Have Any Difficulty At School?: Yes ?Were Any Medications Ever Prescribed For These Difficulties?:  No ?Patient's Education Has Been Impacted by Current Illness: No ? ? ?CCA Family/Childhood History ?Family and Relationship History: ?  ? ?Childhood History:  ?Childhood History ?By whom was/is the pati

## 2022-02-07 NOTE — ED Provider Notes (Signed)
BH Urgent Care Continuous Assessment Admission H&P ? ?Date: 02/08/22 ?Patient Name: Nicole [name] ?MRN: [id number] ?Chief Complaint:  ?Chief Complaint  ?Patient presents with  ? Hallucinations  ? Depression  ?   ? ?Diagnoses:  ?Final diagnoses:  ?Homicidal thoughts  ?Recurrent major depressive disorder, remission status unspecified (HCC)  ?Schizoaffective disorder, unspecified type (HCC)  ? ? ?HPI: Nicole [name] 31 y.o female present to [facility] by [employer], for homicidal thoughts.  Per the pt she currently see a psychiatry Dr Merlyn Albert.  For the following conditions Bipolar disorder,  Depression and Schizoaffective disorder.  Pt couldn't not remember the names of her medications.  ? ?TTS triage note: Nicole [name] was transported to [facility] by [employer] for evaluation of psychosis symptoms. Pt reports that she is "seeing things" and "hearing voices". Pt states that the voices tell her to hurt people. Pt reports that this happens when she takes her medicine. Pt made the comment that it happens more when she "gets high off my medicine". Pt denies knowing the names of the medication. Pt denies any psychiatric services but states that her PCP prescribes "sleep pills" that she takes regularly. Pt denies SI or HI. Pt denies any substance use other than smoking cigarettes. ? ? ?Observation of patient,  she is alert and oriented x 4,  speech is clear,  maintain fair eye contact.  Mood depressed and sleepy, affect flat congruent with mood.   Pt report she hear voices tell her to smother other people at the group home.   Pt denies SI,  or paranoia.  Per the pt she live in a group home [facility]).   Pt report she has been hospitalize in the past  which was two year ago for suicidal ideation.   Pt denies alcohol use,  reports she smoke cigarettes daily,  denies marijuana use.   Pt his seem to be a good historian of her psychiatric disorder.   ? ?Recommend inpatient observation  ? ?PHQ 2-9:  ?[employer] from [facility]/17/2021 [facility]  ?Thoughts that you would be better off dead, or of hurting yourself in some way Not at all  ?PHQ-9 Total Score 8  ? ?  ?  ?Flowsheet Row ED from 02/07/2022 in [facility] ED from 12/02/2021 in [facility] EMERGENCY DEPARTMENT ED from 10/12/2021 in [facility] EMERGENCY DEPARTMENT  ?C-SSRS RISK CATEGORY No Risk No Risk No Risk  ? ?  ?  ? ?Total Time spent with patient: 20 minutes ? ?Musculoskeletal  ?Strength & Muscle Tone: within normal limits ?Gait & Station: normal ?Patient leans: N/A ? ?Psychiatric Specialty Exam  ?Presentation ?General Appearance: Appropriate for Environment ? ?Eye Contact:Good ? ?Speech:Clear and Coherent ? ?Speech Volume:Normal ? ?Handedness:Ambidextrous ? ? ?Mood and Affect  ?Mood:Anxious; Depressed ? ?Affect:Flat; Depressed ? ? ?Thought Process  ?Thought Processes:Coherent ? ?Descriptions of Associations:Circumstantial ? ?Orientation:Full (Time, Place and Person) ? ?Thought Content:Abstract Reasoning ?   ?Hallucinations:Hallucinations: Auditory ?Description of Auditory Hallucinations: to smother other people who she live with at the group home ? ?Ideas of Reference:None ? ?Suicidal Thoughts:Suicidal Thoughts: No ? ?Homicidal Thoughts:Homicidal Thoughts: Yes, Active ?HI Active Intent and/or Plan: With Intent ?HI Passive Intent and/or Plan: With Intent ? ? ?Sensorium  ?Memory:Immediate Fair ? ?Judgment:Fair ? ?Insight:Fair ? ? ?Executive Functions  ?Concentration:Fair ? ?Attention Span:Fair ? ?Recall:Fair ? ?Fund of Knowledge:Fair ? ?Language:Fair ? ? ?Psychomotor Activity  ?Psychomotor Activity:Psychomotor Activity: Normal ? ? ?  Assets  ?Assets:Communication Skills; Desire for Improvement; Housing; Leisure Time; Physical Health; Social Support ? ? ?Sleep  ?Sleep:Sleep: Fair ?Number of Hours of Sleep: 7 ? ? ?Nutritional Assessment (For OBS and FBC admissions only) ?Has the patient had a weight loss or  gain of 10 pounds or more in the last 3 months?: No ?Has the patient had a decrease in food intake/or appetite?: No ?Does the patient have dental problems?: No ?Does the patient have eating habits or behaviors that may be indicators of an eating disorder including binging or inducing vomiting?: No ?Has the patient recently lost weight without trying?: 0 ?Has the patient been eating poorly because of a decreased appetite?: 0 ?Malnutrition Screening Tool Score: 0 ? ? ? ?Physical Exam ?HENT:  ?   Head: Normocephalic.  ?   Nose: Nose normal.  ?Cardiovascular:  ?   Rate and Rhythm: Normal rate.  ?Pulmonary:  ?   Effort: Pulmonary effort is normal.  ?Musculoskeletal:     ?   General: Normal range of motion.  ?   Cervical back: Normal range of motion.  ?Skin: ?   General: Skin is warm.  ?Neurological:  ?   General: No focal deficit present.  ?   Mental Status: She is alert.  ?Psychiatric:     ?   Mood and Affect: Mood normal.     ?   Behavior: Behavior normal.     ?   Thought Content: Thought content normal.     ?   Judgment: Judgment normal.  ? ?Review of Systems  ?Constitutional: Negative.   ?HENT: Negative.  Negative for hearing loss.   ?Eyes: Negative.   ?Respiratory: Negative.    ?Cardiovascular: Negative.   ?Gastrointestinal: Negative.   ?Genitourinary: Negative.   ?Musculoskeletal: Negative.   ?Skin: Negative.   ?Neurological: Negative.   ?Endo/Heme/Allergies: Negative.   ?Psychiatric/Behavioral:  Positive for depression and hallucinations.   ? ?Blood pressure 116/87, pulse 97, temperature 98.5 ?F (36.9 ?C), temperature source Oral, resp. rate 17, SpO2 100 %. There is no height or weight on file to calculate BMI. ? ?Past Psychiatric History: schizoaffective disorder,  Bipolar disorder and Depression   ? ?Is the patient at risk to self? No  ?Has the patient been a risk to self in the past 6 months? No .    ?Has the patient been a risk to self within the distant past? Yes   ?Is the patient a risk to others? Yes   ?Has  the patient been a risk to others in the past 6 months? Yes   ?Has the patient been a risk to others within the distant past? Yes  ? ?Past Medical History:  ?Past Medical History:  ?Diagnosis Date  ? Anxiety   ? Depression   ?  ?Past Surgical History:  ?Procedure Laterality Date  ? WISDOM TOOTH EXTRACTION Bilateral 2020  ? ? ?Family History:  ?Family History  ?Problem Relation Age of Onset  ? Hypertension Father   ? Diabetes Father   ? ? ?Social History:  ?Social History  ? ?Socioeconomic History  ? Marital status: Single  ?  Spouse name: Not on file  ? Number of children: Not on file  ? Years of education: Not on file  ? Highest education level: Not on file  ?Occupational History  ? Not on file  ?Tobacco Use  ? Smoking status: Every Day  ?  Types: Cigarettes  ? Smokeless tobacco: Never  ?Vaping Use  ? Vaping Use: Some  days  ?Substance and Sexual Activity  ? Alcohol use: Never  ? Drug use: Never  ? Sexual activity: Yes  ?  Partners: Female  ?  Birth control/protection: Implant  ?Other Topics Concern  ? Not on file  ?Social History Narrative  ? Not on file  ? ?Social Determinants of Health  ? ?Financial Resource Strain: Not on file  ?Food Insecurity: Not on file  ?Transportation Needs: Not on file  ?Physical Activity: Not on file  ?Stress: Not on file  ?Social Connections: Not on file  ?Intimate Partner Violence: Not on file  ? ? ?SDOH:  ?SDOH Screenings  ? ?Alcohol Screen: Not on file  ?Depression (PHQ2-9): Medium Risk  ? PHQ-2 Score: 8  ?Financial Resource Strain: Not on file  ?Food Insecurity: Not on file  ?Housing: Not on file  ?Physical Activity: Not on file  ?Social Connections: Not on file  ?Stress: Not on file  ?Tobacco Use: High Risk  ? Smoking Tobacco Use: Every Day  ? Smokeless Tobacco Use: Never  ? Passive Exposure: Not on file  ?Transportation Needs: Not on file  ? ? ?Last Labs:  ?Admission on 02/07/2022  ?Component Date Value Ref Range Status  ? SARS Coronavirus 2 by RT PCR 02/07/2022 NEGATIVE  NEGATIVE  Final  ? Comment: (NOTE) ?SARS-CoV-2 target nucleic acids are NOT DETECTED. ? ?The SARS-CoV-2 RNA is generally detectable in upper respiratory ?specimens during the acute phase of infection. The low

## 2022-02-07 NOTE — Progress Notes (Signed)
?   02/07/22 2013  ?[facility] Triage Screening (Walk-ins at [facility] only)  ?How Did You Hear About [place]? Legal System  ?What Is the Reason for Your Visit/Call Today? Nicole [name] was transported to [facility] by [employer] for evaluation of psychosis symptoms. Pt reports that she is "seeing things" and "hearing voices". Pt states that the voices tell her to hurt people. Pt reports that this happens when she takes her medicine. Pt made the comment that it happens more when she "gets high off my medicine". Pt denies knowing the names of the medication.  Pt denies any psychiatric services but states that her PCP prescribes "sleep pills" that she takes regularly. Pt denies SI or HI. Pt denies any substance use other than smoking cigarettes.  ?How Long Has This Been Causing You Problems? <Week  ?Have You Recently Had Any Thoughts About Hurting Yourself? No  ?Are You Planning to Commit Suicide/Harm Yourself At This time? No  ?Have you Recently Had Thoughts About Hurting Someone [name]? No  ?Are You Planning To Harm Someone At This Time? No  ?Are you currently experiencing any auditory, visual or other hallucinations? Yes  ?Please explain the hallucinations you are currently experiencing: seeing shadows/people and hearing voices  ?Have You Used Any Alcohol or Drugs in the Past 24 Hours? No  ?Do you have any current medical co-morbidities that require immediate attention? No  ?Clinician description of patient physical appearance/behavior: Pt presents with very sad, flat affect--blank stare  ?What Do You Feel Would Help You the Most Today? Treatment for Depression or other mood problem  ?If access to [facility] was not available, would you have sought care in the Emergency Department? Yes  ?Determination of Need Emergent (2 hours) ?(Brought in [employer])  ?Options For Referral Medication Management;BH Urgent Care;Outpatient Therapy;Inpatient Hospitalization  ? ? ?

## 2022-02-08 ENCOUNTER — Encounter (HOSPITAL_COMMUNITY): Payer: Self-pay | Admitting: Emergency Medicine

## 2022-02-08 LAB — CBC WITH DIFFERENTIAL/PLATELET
Abs Immature Granulocytes: 0.02 10*3/uL (ref 0.00–0.07)
Basophils Absolute: 0 10*3/uL (ref 0.0–0.1)
Basophils Relative: 0 %
Eosinophils Absolute: 0 10*3/uL (ref 0.0–0.5)
Eosinophils Relative: 0 %
HCT: 41.9 % (ref 36.0–46.0)
Hemoglobin: 13.6 g/dL (ref 12.0–15.0)
Immature Granulocytes: 0 %
Lymphocytes Relative: 53 %
Lymphs Abs: 4.3 10*3/uL — ABNORMAL HIGH (ref 0.7–4.0)
MCH: 29.9 pg (ref 26.0–34.0)
MCHC: 32.5 g/dL (ref 30.0–36.0)
MCV: 92.1 fL (ref 80.0–100.0)
Monocytes Absolute: 0.5 10*3/uL (ref 0.1–1.0)
Monocytes Relative: 6 %
Neutro Abs: 3.3 10*3/uL (ref 1.7–7.7)
Neutrophils Relative %: 41 %
Platelets: 286 10*3/uL (ref 150–400)
RBC: 4.55 MIL/uL (ref 3.87–5.11)
RDW: 12.9 % (ref 11.5–15.5)
WBC: 8.1 10*3/uL (ref 4.0–10.5)
nRBC: 0 % (ref 0.0–0.2)

## 2022-02-08 LAB — COMPREHENSIVE METABOLIC PANEL
ALT: 25 U/L (ref 0–44)
AST: 18 U/L (ref 15–41)
Albumin: 3.9 g/dL (ref 3.5–5.0)
Alkaline Phosphatase: 66 U/L (ref 38–126)
Anion gap: 4 — ABNORMAL LOW (ref 5–15)
BUN: 10 mg/dL (ref 6–20)
CO2: 29 mmol/L (ref 22–32)
Calcium: 9.4 mg/dL (ref 8.9–10.3)
Chloride: 104 mmol/L (ref 98–111)
Creatinine, Ser: 0.88 mg/dL (ref 0.44–1.00)
GFR, Estimated: >60 mL/min (ref >60)
Glucose, Bld: 92 mg/dL (ref 70–99)
Potassium: 4.1 mmol/L (ref 3.5–5.1)
Sodium: 137 mmol/L (ref 135–145)
Total Bilirubin: 0.2 mg/dL — ABNORMAL LOW (ref 0.3–1.2)
Total Protein: 7.2 g/dL (ref 6.5–8.1)

## 2022-02-08 LAB — LIPID PANEL
Cholesterol: 192 mg/dL (ref 0–200)
HDL: 46 mg/dL (ref >40)
LDL Cholesterol: 108 mg/dL — ABNORMAL HIGH (ref 0–99)
Total CHOL/HDL Ratio: 4.2 RATIO
Triglycerides: 192 mg/dL — ABNORMAL HIGH (ref <150)
VLDL: 38 mg/dL (ref 0–40)

## 2022-02-08 LAB — RESP PANEL BY RT-PCR (FLU A&B, COVID) ARPGX2
Influenza A by PCR: NEGATIVE
Influenza B by PCR: NEGATIVE
SARS Coronavirus 2 by RT PCR: NEGATIVE

## 2022-02-08 LAB — HEMOGLOBIN A1C
Hgb A1c MFr Bld: 5 % (ref 4.8–5.6)
Mean Plasma Glucose: 96.8 mg/dL

## 2022-02-08 LAB — TSH: TSH: 6.344 u[IU]/mL — ABNORMAL HIGH (ref 0.350–4.500)

## 2022-02-08 LAB — ETHANOL: Alcohol, Ethyl (B): <10 mg/dL (ref <10)

## 2022-02-08 MED ORDER — HYDROXYZINE HCL 25 MG PO TABS
50.0000 mg | ORAL_TABLET | Freq: Two times a day (BID) | ORAL | Status: DC
  Administered 2022-02-08: 50 mg via ORAL
  Filled 2022-02-08: qty 2

## 2022-02-08 MED ORDER — QUETIAPINE FUMARATE 200 MG PO TABS
400.0000 mg | ORAL_TABLET | Freq: Two times a day (BID) | ORAL | Status: DC
  Administered 2022-02-08: 400 mg via ORAL
  Filled 2022-02-08: qty 2

## 2022-02-08 MED ORDER — SERTRALINE HCL 50 MG PO TABS
150.0000 mg | ORAL_TABLET | Freq: Every day | ORAL | Status: DC
  Administered 2022-02-08: 150 mg via ORAL
  Filled 2022-02-08: qty 1

## 2022-02-08 MED ORDER — OXCARBAZEPINE 300 MG PO TABS
300.0000 mg | ORAL_TABLET | Freq: Two times a day (BID) | ORAL | Status: DC
  Administered 2022-02-08: 300 mg via ORAL
  Filled 2022-02-08: qty 1

## 2022-02-08 NOTE — Discharge Instructions (Signed)
For your behavioral health needs you are advised to follow up with an outpatient therapist.  Contact one of the providers listed below at your earliest opportunity to schedule an intake appointment: ? ?     [facility] ?     [address] ?     [place], [place] [address] ?     [phone]  ?     New patients are seen at their walk-in clinic.  Walk-in hours are Monday - Friday from 8:30 am - 12:00 pm, and from 1:00 pm - 2:30 pm.  Walk-in patients are seen on a first come, first served basis, so try to arrive as early as possible for the best chance of being seen the same day. ? ?     [facility] ?     [address] ?     [place], [place] [address] ?     [phone] ?     New patients are seen in their walk-in clinic.  Walk-in hours are Monday and Wednesday from 8:00 am - 11:00 am for therapy.  Walk-in patients are seen on a first come, first served basis, so try to arrive as early as possible for the best chance of being seen the same day.  Please note that to be eligible for services you must bring an ID or a piece of mail with your name and a [place] address. ? ?     [facility] ?     [address] ?     [place], [place] [address] ?     [phone]  ?

## 2022-02-08 NOTE — Progress Notes (Signed)
Pt is awake, alert and oriented. Pt did not voice any complaints of pain or discomfort. No signs of acute distress noted. Administered scheduled meds with issue. Pt denies current SI/HI/AVH. Staff will monitor for pt's safety. ?

## 2022-02-08 NOTE — ED Provider Notes (Signed)
FBC/OBS ASAP Discharge Summary ? ?Date and Time: 02/08/2022 2:19 PM  ?Name: Nicole [name]  ?MRN:  [id number]  ? ?Discharge Diagnoses:  ?Final diagnoses:  ?Homicidal thoughts  ?Recurrent major depressive disorder, remission status unspecified ([place])  ?Schizoaffective disorder, unspecified type ([place])  ? ? ?Subjective:  ?Nicole [name] 31 y.o female present to  Hospital by [employer], for homicidal thoughts.  Per the pt she currently see a psychiatry Dr Josph Macho.  For the following conditions Bipolar disorder,  Depression and Schizoaffective disorder.  Pt couldn't not remember the names of her medications. ? ?Stay Summary:  ?She was admitted 4/27 for observation.  She did well overnight without any issues.  ? ?On 4/28 she reported that she reported she slept well.  She reported that her appetite was doing good.  She reported no SI, HI, or AVH.  She reported that she felt like she needed to work on establishing boundaries.  She reported that she would like resources for therapy and anger management.  She states that she feels like her medications are not as helpful when she is stressed and so needs to work on her stress.  She reports she has done good to not be hospitalized in 2 years and wants to continue making forward progress not slide backwards. ? ?After providing her with resources for therapy she was discharged back to her group home who came to pick her up. ? ? ?Total Time spent with patient: 20 minutes ? ?Past Psychiatric History: Bipolar disorder,  Depression and Schizoaffective disorder ?Past Medical History:  ?Past Medical History:  ?Diagnosis Date  ? Anxiety   ? Depression   ?  ?Past Surgical History:  ?Procedure Laterality Date  ? [place] EXTRACTION Bilateral 2020  ? ?Family History:  ?Family History  ?Problem Relation Age of Onset  ? Hypertension Father   ? Diabetes Father   ? ?Family Psychiatric History: Reports no known ?Social History:  ?Social History  ? ?Substance and Sexual Activity  ?Alcohol Use Never  ?    ?Social History  ? ?Substance and Sexual Activity  ?Drug Use Never  ?  ?Social History  ? ?Socioeconomic History  ? Marital status: Single  ?  Spouse name: Not on file  ? Number of children: Not on file  ? Years of education: Not on file  ? Highest education level: Not on file  ?Occupational History  ? Not on file  ?Tobacco Use  ? Smoking status: Every Day  ?  Types: Cigarettes  ? Smokeless tobacco: Never  ?Vaping Use  ? Vaping Use: Some days  ?Substance and Sexual Activity  ? Alcohol use: Never  ? Drug use: Never  ? Sexual activity: Yes  ?  Partners: Female  ?  Birth control/protection: Implant  ?Other Topics Concern  ? Not on file  ?Social History Narrative  ? Not on file  ? ?Social Determinants of Health  ? ?Financial Resource Strain: Not on file  ?Food Insecurity: Not on file  ?Transportation Needs: Not on file  ?Physical Activity: Not on file  ?Stress: Not on file  ?Social Connections: Not on file  ? ?SDOH:  ?SDOH Screenings  ? ?Alcohol Screen: Not on file  ?Depression (PHQ2-9): Medium Risk  ? PHQ-2 Score: 8  ?Financial Resource Strain: Not on file  ?Food Insecurity: Not on file  ?Housing: Not on file  ?Physical Activity: Not on file  ?Social Connections: Not on file  ?Stress: Not on file  ?Tobacco Use: High Risk  ? Smoking Tobacco Use:  Every Day  ? Smokeless Tobacco Use: Never  ? Passive Exposure: Not on file  ?Transportation Needs: Not on file  ? ? ?Tobacco Cessation:  A prescription for an FDA-approved tobacco cessation medication was offered at discharge and the patient refused ? ?Current Medications:  ?Current Facility-Administered Medications  ?Medication Dose Route Frequency Provider Last Rate Last Admin  ? acetaminophen (TYLENOL) tablet 650 mg  650 mg Oral Q6H PRN Evette Georges, NP      ? alum & mag hydroxide-simeth (MAALOX/MYLANTA) 200-200-20 MG/5ML suspension 30 mL  30 mL Oral Q4H PRN Evette Georges, NP      ? hydrOXYzine (ATARAX) tablet 50 mg  50 mg Oral BID Evette Georges, NP   50 mg at 02/08/22 1107   ? magnesium hydroxide (MILK OF MAGNESIA) suspension 30 mL  30 mL Oral Daily PRN Evette Georges, NP      ? Oxcarbazepine (TRILEPTAL) tablet 300 mg  300 mg Oral BID Evette Georges, NP   300 mg at 02/08/22 1106  ? QUEtiapine (SEROQUEL) tablet 400 mg  400 mg Oral BID Evette Georges, NP   400 mg at 02/08/22 1107  ? sertraline (ZOLOFT) tablet 150 mg  150 mg Oral Daily Evette Georges, NP   150 mg at 02/08/22 1107  ? ?Current Outpatient Medications  ?Medication Sig Dispense Refill  ? ABILIFY MAINTENA 400 MG PRSY prefilled syringe 400 mg every 28 (twenty-eight) days.    ? fluticasone (FLONASE) 50 MCG/ACT nasal spray Place 1 spray into both nostrils daily.    ? hydrOXYzine (ATARAX/VISTARIL) 50 MG tablet Take 50 mg by mouth 2 (two) times daily.    ? meloxicam (MOBIC) 15 MG tablet Take 15 mg by mouth daily.    ? Oxcarbazepine (TRILEPTAL) 300 MG tablet Take 300 mg by mouth 2 (two) times daily.    ? QUEtiapine (SEROQUEL) 400 MG tablet Take 400 mg by mouth 2 (two) times daily.    ? sertraline (ZOLOFT) 50 MG tablet Take 150 mg by mouth daily.    ? traZODone (DESYREL) 100 MG tablet Take 100 mg by mouth at bedtime.    ? valACYclovir (VALTREX) 500 MG tablet Take 500 mg by mouth daily.    ? ? ?PTA Medications: (Not in a hospital admission) ? ? ?Musculoskeletal  ?Strength & Muscle Tone: within normal limits ?Gait & Station: normal ?Patient leans: N/A ? ?Psychiatric Specialty Exam  ?Presentation  ?General Appearance: Appropriate for Environment ? ?Eye Contact:Good ? ?Speech:Clear and Coherent ? ?Speech Volume:Normal ? ?Handedness:Ambidextrous ? ? ?Mood and Affect  ?Mood:Anxious; Depressed ? ?Affect:Flat; Depressed ? ? ?Thought Process  ?Thought Processes:Coherent ? ?Descriptions of Associations:Circumstantial ? ?Orientation:Full (Time, Place and Person) ? ?Thought Content:Abstract Reasoning ? Diagnosis of Schizophrenia or Schizoaffective disorder in past: No ? Duration of Psychotic Symptoms: Less than six months ? ?  Hallucinations:Hallucinations: Auditory ?Description of Auditory Hallucinations: to smother other people who she live with at the group home ? ?Ideas of Reference:None ? ?Suicidal Thoughts:Suicidal Thoughts: No ? ?Homicidal Thoughts:Homicidal Thoughts: Yes, Active ?HI Active Intent and/or Plan: With Intent ?HI Passive Intent and/or Plan: With Intent ? ? ?Sensorium  ?Memory:Immediate Fair ? ?Judgment:Fair ? ?Insight:Fair ? ? ?Executive Functions  ?Concentration:Fair ? ?Attention Span:Fair ? ?Recall:Fair ? ?[place] ? ?Language:Fair ? ? ?Psychomotor Activity  ?Psychomotor Activity:Psychomotor Activity: Normal ? ? ?Assets  ?Assets:Communication Skills; Desire for Improvement; Housing; Leisure Time; Physical Health; Social Support ? ? ?Sleep  ?Sleep:Sleep: Fair ?Number of Hours of Sleep: 7 ? ? ?Nutritional Assessment (For OBS and  FBC admissions only) ?Has the patient had a weight loss or gain of 10 pounds or more in the last 3 months?: No ?Has the patient had a decrease in food intake/or appetite?: No ?Does the patient have dental problems?: No ?Does the patient have eating habits or behaviors that may be indicators of an eating disorder including binging or inducing vomiting?: No ?Has the patient recently lost weight without trying?: 0 ?Has the patient been eating poorly because of a decreased appetite?: 0 ?Malnutrition Screening Tool Score: 0 ? ? ? ?Physical Exam  ?Physical Exam ?Vitals and nursing note reviewed.  ?Constitutional:   ?   General: She is not in acute distress. ?   Appearance: Normal appearance. She is not ill-appearing or toxic-appearing.  ?HENT:  ?   Head: Normocephalic and atraumatic.  ?Pulmonary:  ?   Effort: Pulmonary effort is normal.  ?Musculoskeletal:     ?   General: Normal range of motion.  ?Neurological:  ?   General: No focal deficit present.  ?   Mental Status: She is alert.  ? ?Review of Systems  ?Respiratory:  Negative for cough and shortness of breath.   ?Cardiovascular:   Negative for chest pain.  ?Gastrointestinal:  Negative for abdominal pain, constipation, diarrhea, nausea and vomiting.  ?Neurological:  Positive for dizziness (mild). Negative for weakness and headaches.  ?Blood pressure 114/76,

## 2022-02-08 NOTE — BH Assessment (Signed)
BHH Assessment Progress Note ?  ?Per Dr Renaldo Fiddler, this pt does not require psychiatric hospitalization at this time.  Pt is psychiatrically cleared.  Discharge instructions include referral information for several area outpatient therapy providers.  Pt reports that the operator of her group home is Ms Nicole [name] [phone]).  At 14:05 I called her to arrange for pt to be picked up.  She reports that she will be here around 16:00 to pick pt up.  Registration record shows that pt has a legal guardian, Nicole [name]; it is unclear whether this is the same person.  Ms Nicole [name] reports that pt's legal guardian is her step-mother, and she does not have a phone number or a letter of guardianship for this individual.  I asked her to provide them at her earliest opportunity.  Dr Renaldo Fiddler and pt's nurse, Emeterio Reeve, have been notified. ? ?Nicole Canning, MA ?Triage Specialist ?[phone] ? ?

## 2022-02-08 NOTE — Discharge Summary (Signed)
Nicole [name] to be D/C'd Home per MD order. Discussed with the patient and all questions fully answered. An After Visit Summary was printed and given to the patient. Patient escorted out, and D/C home via private auto.  ?Jennessy Sandridge  Marquis Lunch  ?02/08/2022 3:10 PM ?  ?   ?

## 2022-02-08 NOTE — ED Notes (Signed)
Pt sleeping at present, no distress noted, respirations even & unlabored.  Monitoring for safety. ?

## 2022-04-10 DIAGNOSIS — R0789 Other chest pain: Secondary | ICD-10-CM | POA: Diagnosis not present

## 2022-04-10 DIAGNOSIS — Z0000 Encounter for general adult medical examination without abnormal findings: Secondary | ICD-10-CM | POA: Diagnosis not present

## 2022-04-10 DIAGNOSIS — R7303 Prediabetes: Secondary | ICD-10-CM | POA: Diagnosis not present

## 2022-04-11 DIAGNOSIS — Z0000 Encounter for general adult medical examination without abnormal findings: Secondary | ICD-10-CM | POA: Diagnosis not present

## 2022-04-11 DIAGNOSIS — E559 Vitamin D deficiency, unspecified: Secondary | ICD-10-CM | POA: Diagnosis not present

## 2022-04-11 DIAGNOSIS — R7303 Prediabetes: Secondary | ICD-10-CM | POA: Diagnosis not present

## 2022-04-11 DIAGNOSIS — R071 Chest pain on breathing: Secondary | ICD-10-CM | POA: Diagnosis not present

## 2022-05-13 DIAGNOSIS — J302 Other seasonal allergic rhinitis: Secondary | ICD-10-CM | POA: Diagnosis not present

## 2022-05-13 DIAGNOSIS — K5904 Chronic idiopathic constipation: Secondary | ICD-10-CM | POA: Diagnosis not present

## 2022-05-13 DIAGNOSIS — N760 Acute vaginitis: Secondary | ICD-10-CM | POA: Diagnosis not present

## 2022-06-07 ENCOUNTER — Ambulatory Visit (HOSPITAL_COMMUNITY)
Admission: EM | Admit: 2022-06-07 | Discharge: 2022-06-10 | Disposition: A | Discharge status: Home / Self Care | Payer: Medicare HMO | Attending: Nurse Practitioner | Admitting: Nurse Practitioner

## 2022-06-07 DIAGNOSIS — Z20822 Contact with and (suspected) exposure to covid-19: Secondary | ICD-10-CM | POA: Diagnosis not present

## 2022-06-07 DIAGNOSIS — Z79899 Other long term (current) drug therapy: Secondary | ICD-10-CM | POA: Diagnosis not present

## 2022-06-07 DIAGNOSIS — F209 Schizophrenia, unspecified: Secondary | ICD-10-CM | POA: Diagnosis not present

## 2022-06-07 DIAGNOSIS — F32A Depression, unspecified: Secondary | ICD-10-CM | POA: Diagnosis not present

## 2022-06-07 DIAGNOSIS — R45851 Suicidal ideations: Secondary | ICD-10-CM | POA: Diagnosis not present

## 2022-06-07 DIAGNOSIS — R45850 Homicidal ideations: Secondary | ICD-10-CM | POA: Diagnosis not present

## 2022-06-07 LAB — LIPID PANEL
Cholesterol: 164 mg/dL (ref 0–200)
HDL: 44 mg/dL (ref >40)
LDL Cholesterol: 88 mg/dL (ref 0–99)
Total CHOL/HDL Ratio: 3.7 RATIO
Triglycerides: 158 mg/dL — ABNORMAL HIGH (ref <150)
VLDL: 32 mg/dL (ref 0–40)

## 2022-06-07 LAB — URINALYSIS, ROUTINE W REFLEX MICROSCOPIC
Bilirubin Urine: NEGATIVE
Glucose, UA: NEGATIVE mg/dL
Hgb urine dipstick: NEGATIVE
Ketones, ur: NEGATIVE mg/dL
Leukocytes,Ua: NEGATIVE
Nitrite: NEGATIVE
Protein, ur: NEGATIVE mg/dL
Specific Gravity, Urine: 1.018 (ref 1.005–1.030)
pH: 7 (ref 5.0–8.0)

## 2022-06-07 LAB — CBC WITH DIFFERENTIAL/PLATELET
Abs Immature Granulocytes: 0.01 10*3/uL (ref 0.00–0.07)
Basophils Absolute: 0 10*3/uL (ref 0.0–0.1)
Basophils Relative: 0 %
Eosinophils Absolute: 0 10*3/uL (ref 0.0–0.5)
Eosinophils Relative: 0 %
HCT: 40.4 % (ref 36.0–46.0)
Hemoglobin: 13.2 g/dL (ref 12.0–15.0)
Immature Granulocytes: 0 %
Lymphocytes Relative: 54 %
Lymphs Abs: 3.1 10*3/uL (ref 0.7–4.0)
MCH: 30.1 pg (ref 26.0–34.0)
MCHC: 32.7 g/dL (ref 30.0–36.0)
MCV: 92 fL (ref 80.0–100.0)
Monocytes Absolute: 0.2 10*3/uL (ref 0.1–1.0)
Monocytes Relative: 4 %
Neutro Abs: 2.4 10*3/uL (ref 1.7–7.7)
Neutrophils Relative %: 42 %
Platelets: 308 10*3/uL (ref 150–400)
RBC: 4.39 MIL/uL (ref 3.87–5.11)
RDW: 12.4 % (ref 11.5–15.5)
WBC: 5.7 10*3/uL (ref 4.0–10.5)
nRBC: 0 % (ref 0.0–0.2)

## 2022-06-07 LAB — POCT URINE DRUG SCREEN - MANUAL ENTRY (I-SCREEN)
POC Amphetamine UR: NOT DETECTED
POC Buprenorphine (BUP): NOT DETECTED
POC Cocaine UR: NOT DETECTED
POC Marijuana UR: NOT DETECTED
POC Methadone UR: NOT DETECTED
POC Methamphetamine UR: NOT DETECTED
POC Morphine: NOT DETECTED
POC Oxazepam (BZO): NOT DETECTED
POC Oxycodone UR: NOT DETECTED
POC Secobarbital (BAR): NOT DETECTED

## 2022-06-07 LAB — HIV ANTIBODY (ROUTINE TESTING W REFLEX): HIV Screen 4th Generation wRfx: NONREACTIVE

## 2022-06-07 LAB — COMPREHENSIVE METABOLIC PANEL
ALT: 22 U/L (ref 0–44)
AST: 19 U/L (ref 15–41)
Albumin: 3.7 g/dL (ref 3.5–5.0)
Alkaline Phosphatase: 54 U/L (ref 38–126)
Anion gap: 7 (ref 5–15)
BUN: 8 mg/dL (ref 6–20)
CO2: 28 mmol/L (ref 22–32)
Calcium: 9.1 mg/dL (ref 8.9–10.3)
Chloride: 104 mmol/L (ref 98–111)
Creatinine, Ser: 0.84 mg/dL (ref 0.44–1.00)
GFR, Estimated: >60 mL/min (ref >60)
Glucose, Bld: 104 mg/dL — ABNORMAL HIGH (ref 70–99)
Potassium: 4 mmol/L (ref 3.5–5.1)
Sodium: 139 mmol/L (ref 135–145)
Total Bilirubin: 0.4 mg/dL (ref 0.3–1.2)
Total Protein: 6.9 g/dL (ref 6.5–8.1)

## 2022-06-07 LAB — RESP PANEL BY RT-PCR (FLU A&B, COVID) ARPGX2
Influenza A by PCR: NEGATIVE
Influenza B by PCR: NEGATIVE
SARS Coronavirus 2 by RT PCR: NEGATIVE

## 2022-06-07 LAB — POC SARS CORONAVIRUS 2 AG: SARSCOV2ONAVIRUS 2 AG: NEGATIVE

## 2022-06-07 LAB — TSH: TSH: 1.62 u[IU]/mL (ref 0.350–4.500)

## 2022-06-07 LAB — HEMOGLOBIN A1C
Hgb A1c MFr Bld: 5 % (ref 4.8–5.6)
Mean Plasma Glucose: 96.8 mg/dL

## 2022-06-07 MED ORDER — HYDROXYZINE HCL 25 MG PO TABS
25.0000 mg | ORAL_TABLET | Freq: Three times a day (TID) | ORAL | Status: DC | PRN
Start: 2022-06-07 — End: 2022-06-07

## 2022-06-07 MED ORDER — QUETIAPINE FUMARATE 200 MG PO TABS
200.0000 mg | ORAL_TABLET | Freq: Every day | ORAL | Status: DC
  Administered 2022-06-07: 200 mg via ORAL
  Filled 2022-06-07: qty 1

## 2022-06-07 MED ORDER — VALACYCLOVIR HCL 500 MG PO TABS
500.0000 mg | ORAL_TABLET | Freq: Every day | ORAL | Status: DC
  Administered 2022-06-08: 500 mg via ORAL
  Filled 2022-06-07 (×2): qty 1

## 2022-06-07 MED ORDER — MAGNESIUM HYDROXIDE 400 MG/5ML PO SUSP
30.0000 mL | Freq: Every day | ORAL | Status: DC | PRN
  Administered 2022-06-10: 30 mL via ORAL
  Filled 2022-06-07: qty 30

## 2022-06-07 MED ORDER — ALUM & MAG HYDROXIDE-SIMETH 200-200-20 MG/5ML PO SUSP: 30.0000 mL | ORAL | Status: DC | PRN

## 2022-06-07 MED ORDER — HYDROXYZINE HCL 25 MG PO TABS: 50.0000 mg | ORAL_TABLET | Freq: Three times a day (TID) | ORAL | Status: DC | PRN

## 2022-06-07 MED ORDER — ACETAMINOPHEN 325 MG PO TABS
650.0000 mg | ORAL_TABLET | Freq: Four times a day (QID) | ORAL | Status: DC | PRN
  Administered 2022-06-08: 650 mg via ORAL
  Filled 2022-06-07: qty 2

## 2022-06-07 MED ORDER — TRAZODONE HCL 100 MG PO TABS: 100.0000 mg | ORAL_TABLET | Freq: Every evening | ORAL | Status: DC | PRN

## 2022-06-07 NOTE — ED Notes (Signed)
Pt currently in flex unit bed two.  Sitting up on the side of the bed and eating chips.   Staff will continue to monitor for safety.

## 2022-06-07 NOTE — ED Notes (Signed)
Pt

## 2022-06-07 NOTE — ED Notes (Signed)
Pt A&O x 4, sleeping at present, no distress noted at present,  Monitoring for safety.

## 2022-06-07 NOTE — ED Notes (Signed)
Pt was given dinner. 
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
## 2022-06-07 NOTE — ED Notes (Signed)
Pt ambulatory to bathroom, gait steady, no distress noted, calm & cooperative.   Monitoring for safety.
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
## 2022-06-08 DIAGNOSIS — F209 Schizophrenia, unspecified: Secondary | ICD-10-CM | POA: Diagnosis not present

## 2022-06-08 DIAGNOSIS — R45850 Homicidal ideations: Secondary | ICD-10-CM | POA: Diagnosis not present

## 2022-06-08 DIAGNOSIS — Z79899 Other long term (current) drug therapy: Secondary | ICD-10-CM | POA: Diagnosis not present

## 2022-06-08 DIAGNOSIS — R45851 Suicidal ideations: Secondary | ICD-10-CM | POA: Diagnosis not present

## 2022-06-08 DIAGNOSIS — Z20822 Contact with and (suspected) exposure to covid-19: Secondary | ICD-10-CM | POA: Diagnosis not present

## 2022-06-08 DIAGNOSIS — F32A Depression, unspecified: Secondary | ICD-10-CM | POA: Diagnosis not present

## 2022-06-08 LAB — POCT PREGNANCY, URINE: Preg Test, Ur: NEGATIVE

## 2022-06-08 LAB — SYPHILIS: RPR W/REFLEX TO RPR TITER AND TREPONEMAL ANTIBODIES, TRADITIONAL SCREENING AND DIAGNOSIS ALGORITHM: RPR Ser Ql: NONREACTIVE

## 2022-06-08 LAB — PREGNANCY, URINE: Preg Test, Ur: NEGATIVE

## 2022-06-08 MED ORDER — HYDROXYZINE HCL 25 MG PO TABS
50.0000 mg | ORAL_TABLET | Freq: Two times a day (BID) | ORAL | Status: DC
  Administered 2022-06-08 – 2022-06-10 (×5): 50 mg via ORAL
  Filled 2022-06-08 (×5): qty 2

## 2022-06-08 MED ORDER — QUETIAPINE FUMARATE 200 MG PO TABS
400.0000 mg | ORAL_TABLET | Freq: Two times a day (BID) | ORAL | Status: DC
  Administered 2022-06-08 – 2022-06-10 (×5): 400 mg via ORAL
  Filled 2022-06-08 (×5): qty 2

## 2022-06-08 MED ORDER — FLUTICASONE PROPIONATE 50 MCG/ACT NA SUSP
1.0000 | Freq: Every day | NASAL | Status: DC
Start: 2022-06-08 — End: 2022-06-10
  Administered 2022-06-08 – 2022-06-10 (×3): 1 via NASAL
  Filled 2022-06-08: qty 16

## 2022-06-08 MED ORDER — OXCARBAZEPINE 300 MG PO TABS
300.0000 mg | ORAL_TABLET | Freq: Two times a day (BID) | ORAL | Status: DC
  Administered 2022-06-08 – 2022-06-10 (×5): 300 mg via ORAL
  Filled 2022-06-08 (×5): qty 1

## 2022-06-08 MED ORDER — ONDANSETRON 4 MG PO TBDP: 4.0000 mg | ORAL_TABLET | Freq: Two times a day (BID) | ORAL | Status: DC | PRN

## 2022-06-08 MED ORDER — TRAZODONE HCL 100 MG PO TABS: 100.0000 mg | ORAL_TABLET | Freq: Every evening | ORAL | Status: DC | PRN

## 2022-06-08 MED ORDER — VALACYCLOVIR HCL 500 MG PO TABS
500.0000 mg | ORAL_TABLET | Freq: Every day | ORAL | Status: DC
  Administered 2022-06-09 – 2022-06-10 (×2): 500 mg via ORAL
  Filled 2022-06-08 (×2): qty 1

## 2022-06-08 MED ORDER — SERTRALINE HCL 50 MG PO TABS
150.0000 mg | ORAL_TABLET | Freq: Every day | ORAL | Status: DC
  Administered 2022-06-08 – 2022-06-10 (×3): 150 mg via ORAL
  Filled 2022-06-08 (×3): qty 1

## 2022-06-08 NOTE — ED Notes (Signed)
Pt Aox4 and provided breakfast.  She reports abdominal pain, prn medication provided.  Pt denies SI, HI, and AVH.  Breathing is even and unlabored.  Will continue to monitor for safety.

## 2022-06-08 NOTE — Care Management (Signed)
  Per Alcario Drought, NP - patient meets criteria for inpatient hospitalization. Per Rosey Bath at [facility], there are no appropriate beds for the patient.   Writer sent referral for inpatient hospitalization to the following facilities:  [facility]  [place]  [facility] [facility]  [facility]  [facility]  [facility]  [facility]  [facility]  [facility]  [place]  [facility]  [facility]  [place]  [address]

## 2022-06-08 NOTE — ED Provider Notes (Signed)
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   Comment:  THE SENSITIVITY OF THIS METHODOLOGY IS >24 mIU/mL     Blood Alcohol level:  Lab Results  Component Value Date   ETH <10 [id number]    Metabolic Disorder Labs: Lab Results  Component Value Date   HGBA1C 5.0 06/07/2022   MPG 96.8 06/07/2022   MPG 96.8 02/07/2022   No results found for: "PROLACTIN" Lab Results  Component Value Date   CHOL 164 06/07/2022    TRIG 158 (H) 06/07/2022   HDL 44 06/07/2022   CHOLHDL 3.7 06/07/2022   VLDL 32 06/07/2022   LDLCALC 88 06/07/2022   LDLCALC 108 (H) 02/07/2022    Therapeutic Lab Levels: No results found for: "LITHIUM" No results found for: "VALPROATE" No results found for: "CBMZ"  Physical Findings   GAD-7    Flowsheet Row Office Visit from 12/17/2021 in [facility]  Total GAD-7 Score 17      PHQ2-9    [place] Visit from 12/17/2021 in [place]  PHQ-2 Total Score 2  PHQ-9 Total Score 8      Flowsheet Row ED from 06/07/2022 in [facility] ED from 02/07/2022 in [facility] ED from 12/02/2021 in [place] High Risk No Risk No Risk        Musculoskeletal  Strength & Muscle Tone: within normal limits Gait & Station: normal Patient leans: N/A  Psychiatric Specialty Exam  Presentation  General Appearance: Appropriate for Environment; Fairly Groomed  Eye Contact:Fair  Speech:Clear and Coherent  Speech Volume:Normal  Handedness:Right   Mood and Affect  Mood:Depressed  Affect:Congruent   Thought Process  Thought Processes:Coherent  Descriptions of Associations:Intact  Orientation:Full (Time, Place and Person)  Thought Content:Illogical  Diagnosis of Schizophrenia or Schizoaffective disorder in past: Yes  Duration of Psychotic Symptoms: Greater than six months   Hallucinations:Hallucinations: Auditory Description of Auditory Hallucinations: nonspecific voices  Ideas of Reference:Paranoia; Delusions  Suicidal Thoughts:Suicidal Thoughts: Yes, Active SI Active Intent and/or Plan: Without Intent; Without Plan  Homicidal Thoughts:Homicidal Thoughts: Yes, Active HI Active Intent and/or Plan: Without Intent; Without Plan   Sensorium  Memory:Immediate  Good  Judgment:Poor  Insight:Poor   Executive Functions  Concentration:Fair  Attention Span:Fair  [place]   Psychomotor Activity  Psychomotor Activity:Psychomotor Activity: Normal   Assets  Assets:Communication Skills; Housing; Social Support   Sleep  Sleep:Sleep: Fair   No data recorded  Physical Exam  Physical Exam Vitals and nursing note reviewed.  Constitutional:      Appearance: She is obese. She is ill-appearing.  HENT:     Head: Normocephalic.     Nose: Nose normal.     Mouth/Throat:     Mouth: Mucous membranes are moist.     Pharynx: Oropharynx is clear.  Eyes:     Pupils: Pupils are equal, round, and reactive to light.  Cardiovascular:     Rate and Rhythm: Normal rate.     Pulses: Normal pulses.  Pulmonary:     Effort: Pulmonary effort is normal.  Abdominal:     General: There is distension.     Tenderness: There is guarding.  Musculoskeletal:        General: Normal range of motion.     Cervical back: Normal range of motion.  Skin:    General: Skin is warm and dry.  Neurological:     Mental Status: She is oriented to  person, place, and time.  Psychiatric:        Attention and Perception: She does not perceive auditory or visual hallucinations.        Mood and Affect: Affect is blunt.        Behavior: Behavior is cooperative.        Thought Content: Thought content does not include homicidal or suicidal ideation. Thought content does not include homicidal or suicidal plan.        Cognition and Memory: Cognition normal.    Review of Systems  Gastrointestinal:  Positive for abdominal pain and nausea.  Psychiatric/Behavioral:  Negative for suicidal ideas.   All other systems reviewed and are negative.  Blood pressure 123/89, pulse 93, temperature 98.4 F (36.9 C), temperature source Oral, resp. rate 14, SpO2 100 %. There is no height or weight on file to calculate BMI.  Treatment Plan  Summary: Plan Home medications restarted; patient continuously observed with no active psychotic symptoms, suicidal or homicidal ideations. She denies any concerns for her safety. Patient is being psych cleared to return to facility. Group home owner [name] contacted to arrange transportation and currently refusing until Monday. SW/TOC to follow up.   Inda Merlin, NP 06/08/2022 10:29 AM

## 2022-06-08 NOTE — ED Notes (Signed)
Pt resting quietly with eyes closed.  No pain or discomfort noted/voiced.  Breathing is even and unlabored.  Will continue to monitor for safety.  

## 2022-06-08 NOTE — ED Notes (Signed)
Nicole [name] remains asleep and has been asleep since the start of the PM shift. Her sleep is undisturbed and no distress has been noted respiratory rate is 16 and easy.

## 2022-06-08 NOTE — ED Notes (Signed)
Pt sleeping at present, no distress noted.  Monitoring for safety. 

## 2022-06-08 NOTE — ED Notes (Signed)
Pt has slept a majority of the day.  Currently reports her stomach pain has gotten better.  Dinner has been provided.  Breathing is even and unlabored.  Will continue to monitor for safety.

## 2022-06-09 DIAGNOSIS — Z79899 Other long term (current) drug therapy: Secondary | ICD-10-CM | POA: Diagnosis not present

## 2022-06-09 DIAGNOSIS — R45850 Homicidal ideations: Secondary | ICD-10-CM | POA: Diagnosis not present

## 2022-06-09 DIAGNOSIS — F209 Schizophrenia, unspecified: Secondary | ICD-10-CM | POA: Diagnosis not present

## 2022-06-09 DIAGNOSIS — R45851 Suicidal ideations: Secondary | ICD-10-CM | POA: Diagnosis not present

## 2022-06-09 DIAGNOSIS — Z20822 Contact with and (suspected) exposure to covid-19: Secondary | ICD-10-CM | POA: Diagnosis not present

## 2022-06-09 DIAGNOSIS — F32A Depression, unspecified: Secondary | ICD-10-CM | POA: Diagnosis not present

## 2022-06-09 NOTE — ED Notes (Signed)
Pt A& O x 4, watching TV at present, no distress noted, calm & cooperative.  Monitoring for safety. 

## 2022-06-09 NOTE — ED Notes (Signed)
[name]&Ox4. Stated sleeping well. Patient denies SI, HI, and A/V/H at this time with no plan/intent and contracts for safety on unit. Patient ate breakfast, med compliant, and in no current distress.

## 2022-06-09 NOTE — ED Notes (Signed)
Pt sleeping at present, no distress noted.  Monitoring for safety. 

## 2022-06-09 NOTE — Progress Notes (Signed)
Patient watching tv with periods of falling asleep throughout day. Patient remains cooperative and safe on unit.

## 2022-06-09 NOTE — ED Provider Notes (Signed)
Behavioral Health Progress Note  Date and Time: 06/09/2022 11:40 AM Name: Nicole [name] MRN:  [id number]  Subjective:  Nicole [name] reported she is hopeful to discharge today.  States she attempted to follow-up with "Nicole [name]" group home representative.  As it was reported that Nicole [name] reports she is currently out of town and is unable to pick up patient until Monday morning 06/10/2022.    Patient was psychiatrically cleared on 06/08/2022.Continues to deny suicidal or homicidal ideations.  Denies auditory visual hallucinations.  Patient has been medication compliant since her admission to [facility] urgent care facility.  Nicole [name] stated that her legal guardian is her aunt. ( Charted legal guardian is Nicole [name]) CSW to follow-up prior to discharge disposition.   Diagnosis:  Final diagnoses:  Schizophrenia, unspecified type (HCC)    Total Time spent with patient: 15 minutes  Past Psychiatric History:  Past Medical History:  Past Medical History:  Diagnosis Date   Anxiety    Depression     Past Surgical History:  Procedure Laterality Date   WISDOM TOOTH EXTRACTION Bilateral 2020   Family History:  Family History  Problem Relation Age of Onset   Hypertension Father    Diabetes Father    Family Psychiatric  History:  Social History:  Social History   Substance and Sexual Activity  Alcohol Use Never     Social History   Substance and Sexual Activity  Drug Use Never    Social History   Socioeconomic History   Marital status: Single    Spouse name: Not on file   Number of children: Not on file   Years of education: Not on file   Highest education level: Not on file  Occupational History   Not on file  Tobacco Use   Smoking status: Every Day    Types: Cigarettes   Smokeless tobacco: Never  Vaping Use   Vaping Use: Some days  Substance and Sexual Activity   Alcohol use: Never   Drug use: Never   Sexual activity: Yes    Partners: Female    Birth  control/protection: Implant  Other Topics Concern   Not on file  Social History Narrative   Not on file   Social Determinants of Health   Financial Resource Strain: Not on file  Food Insecurity: Not on file  Transportation Needs: Not on file  Physical Activity: Not on file  Stress: Not on file  Social Connections: Not on file   SDOH:  SDOH Screenings   Alcohol Screen: Not on file  Depression (PHQ2-9): Medium Risk (12/17/2021)   Depression (PHQ2-9)    PHQ-2 Score: 8  Financial Resource Strain: Not on file  Food Insecurity: Not on file  Housing: Not on file  Physical Activity: Not on file  Social Connections: Not on file  Stress: Not on file  Tobacco Use: High Risk (02/08/2022)   Patient History    Smoking Tobacco Use: Every Day    Smokeless Tobacco Use: Never    Passive Exposure: Not on file  Transportation Needs: Not on file   Additional Social History:    Pain Medications: See Mar Prescriptions: See MAR Over the Counter: See MAR History of alcohol / drug use?: Yes Longest period of sobriety (when/how long): Unknown Negative Consequences of Use:  (NA) Withdrawal Symptoms: None Name of Substance 1: Alcohol (wine) to note pt renders limited information due to pt's lack of reporting hx ASAM was not completed 1 - Age of First Use: Pt states she doesn't remember  1 - Amount (size/oz): Pt is vague in reference to amounts used and time frame 1 - Frequency: Pt states "only once in [name] while" pt is vague in reference to frequency 1 - Duration: Ongoing 1 - Last Use / Amount: Pt states over [name] week ago (again pt is vague in reference to use patterns) 1- Route of Use: Oral                  Sleep: Good  Appetite:  Good  Current Medications:  Current Facility-Administered Medications  Medication Dose Route Frequency Provider Last Rate Last Admin   acetaminophen (TYLENOL) tablet 650 mg  650 mg Oral Q6H PRN Nicole BlueNkwenti, Doris, [name]   650 mg at 06/08/22 0916   alum & mag  hydroxide-simeth (MAALOX/MYLANTA) 200-200-20 MG/5ML suspension 30 mL  30 mL Oral Q4H PRN Nicole BlueNkwenti, Doris, [name]       fluticasone (FLONASE) 50 MCG/ACT nasal spray 1 spray  1 spray Each Nare Daily Nicole [name], Nicole [name], [name]   1 spray at 06/09/22 0912   hydrOXYzine (ATARAX) tablet 50 mg  50 mg Oral TID PRN Nicole BlueNkwenti, Doris, [name]       hydrOXYzine (ATARAX) tablet 50 mg  50 mg Oral BID Nicole [name], Nicole [name], [name]   50 mg at 06/09/22 0910   magnesium hydroxide (MILK OF MAGNESIA) suspension 30 mL  30 mL Oral Daily PRN Nicole BlueNkwenti, Doris, [name]       ondansetron (ZOFRAN-ODT) disintegrating tablet 4 mg  4 mg Oral BID PRN Nicole [name], Nicole [name], [name]       Oxcarbazepine (TRILEPTAL) tablet 300 mg  300 mg Oral BID Nicole [name], Nicole [name], [name]   300 mg at 06/09/22 0911   QUEtiapine (SEROQUEL) tablet 400 mg  400 mg Oral BID Nicole [name], Nicole [name], [name]   400 mg at 06/09/22 0912   sertraline (ZOLOFT) tablet 150 mg  150 mg Oral Daily Nicole [name], Nicole [name], [name]   150 mg at 06/09/22 0912   traZODone (DESYREL) tablet 100 mg  100 mg Oral QHS PRN Nicole [name], Nicole [name], [name]       valACYclovir (VALTREX) tablet 500 mg  500 mg Oral Daily Nicole [name], Nicole [name], [name]   500 mg at 06/09/22 0911   Current Outpatient Medications  Medication Sig Dispense Refill   ABILIFY MAINTENA 400 MG PRSY prefilled syringe 400 mg every 28 (twenty-eight) days.     cetirizine (ZYRTEC) 10 MG tablet Take 10 mg by mouth daily.     fluticasone (FLONASE) 50 MCG/ACT nasal spray Place 1 spray into both nostrils daily.     hydrOXYzine (ATARAX/VISTARIL) 50 MG tablet Take 50 mg by mouth 2 (two) times daily.     ibuprofen (ADVIL) 800 MG tablet Take 800 mg by mouth 2 (two) times daily as needed (For pain).     meloxicam (MOBIC) 15 MG tablet Take 15 mg by mouth daily.     Oxcarbazepine (TRILEPTAL) 300 MG tablet Take 300 mg by mouth 2 (two) times daily.     QUEtiapine (SEROQUEL) 400 MG tablet Take 400 mg by mouth 2 (two) times daily.     sertraline (ZOLOFT) 50 MG  tablet Take 150 mg by mouth daily.     traZODone (DESYREL) 100 MG tablet Take 100 mg by mouth at bedtime.     valACYclovir (VALTREX) 500 MG tablet Take 500 mg by mouth daily.      Labs  Lab Results:  Admission on 06/07/2022  Component Date Value Ref Range Status   SARS Coronavirus 2 by  RT PCR 06/07/2022 NEGATIVE  NEGATIVE Final   Comment: (NOTE) SARS-CoV-2 target nucleic acids are NOT DETECTED.  The SARS-CoV-2 RNA is generally detectable in upper respiratory specimens during the acute phase of infection. The lowest concentration of SARS-CoV-2 viral copies this assay can detect is 138 copies/mL. [name] negative result does not preclude SARS-Cov-2 infection and should not be used as the sole basis for treatment or other patient management decisions. [name] negative result may occur with  improper specimen collection/handling, submission of specimen other than nasopharyngeal swab, presence of viral mutation(s) within the areas targeted by this assay, and inadequate number of viral copies(<138 copies/mL). [name] negative result must be combined with clinical observations, patient history, and epidemiological information. The expected result is Negative.  Fact Sheet for Patients:  BloggerCourse.com  Fact Sheet for Healthcare Providers:  SeriousBroker.it  This test is no                          t yet approved or cleared by the [place] FDA and  has been authorized for detection and/or diagnosis of SARS-CoV-2 by FDA under an Emergency Use Authorization (EUA). This EUA will remain  in effect (meaning this test can be used) for the duration of the COVID-19 declaration under Section 564(b)(1) of the Act, 21 U.S.C.section 360bbb-3(b)(1), unless the authorization is terminated  or revoked sooner.       Influenza [name] by PCR 06/07/2022 NEGATIVE  NEGATIVE Final   Influenza B by PCR 06/07/2022 NEGATIVE  NEGATIVE Final   Comment: (NOTE) The Xpert  Xpress SARS-CoV-2/FLU/RSV plus assay is intended as an aid in the diagnosis of influenza from Nasopharyngeal swab specimens and should not be used as [name] sole basis for treatment. Nasal washings and aspirates are unacceptable for Xpert Xpress SARS-CoV-2/FLU/RSV testing.  Fact Sheet for Patients: BloggerCourse.com  Fact Sheet for Healthcare Providers: SeriousBroker.it  This test is not yet approved or cleared by the [place] FDA and has been authorized for detection and/or diagnosis of SARS-CoV-2 by FDA under an Emergency Use Authorization (EUA). This EUA will remain in effect (meaning this test can be used) for the duration of the COVID-19 declaration under Section 564(b)(1) of the Act, 21 U.S.C. section 360bbb-3(b)(1), unless the authorization is terminated or revoked.  Performed at [facility], [address] [place], [place] [address]    WBC 06/07/2022 5.7  4.0 - 10.5 K/uL Final   RBC 06/07/2022 4.39  3.87 - 5.11 MIL/uL Final   Hemoglobin 06/07/2022 13.2  12.0 - 15.0 g/dL Final   HCT 62/13/0865 40.4  36.0 - 46.0 % Final   MCV 06/07/2022 92.0  80.0 - 100.0 fL Final   MCH 06/07/2022 30.1  26.0 - 34.0 pg Final   MCHC 06/07/2022 32.7  30.0 - 36.0 g/dL Final   RDW 78/46/9629 12.4  11.5 - 15.5 % Final   Platelets 06/07/2022 308  150 - 400 K/uL Final   nRBC 06/07/2022 0.0  0.0 - 0.2 % Final   Neutrophils Relative % 06/07/2022 42  % Final   Neutro Abs 06/07/2022 2.4  1.7 - 7.7 K/uL Final   Lymphocytes Relative 06/07/2022 54  % Final   Lymphs Abs 06/07/2022 3.1  0.7 - 4.0 K/uL Final   Monocytes Relative 06/07/2022 4  % Final   Monocytes Absolute 06/07/2022 0.2  0.1 - 1.0 K/uL Final   Eosinophils Relative 06/07/2022 0  % Final   Eosinophils Absolute 06/07/2022 0.0  0.0 -  0.5 K/uL Final   Basophils Relative 06/07/2022 0  % Final   Basophils Absolute 06/07/2022 0.0  0.0 - 0.1 K/uL Final   Immature Granulocytes 06/07/2022 0   % Final   Abs Immature Granulocytes 06/07/2022 0.01  0.00 - 0.07 K/uL Final   Performed at [facility], [address], [place], [place] [address]   Sodium 06/07/2022 139  135 - 145 mmol/L Final   Potassium 06/07/2022 4.0  3.5 - 5.1 mmol/L Final   Chloride 06/07/2022 104  98 - 111 mmol/L Final   CO2 06/07/2022 28  22 - 32 mmol/L Final   Glucose, Bld 06/07/2022 104 (H)  70 - 99 mg/dL Final   Glucose reference range applies only to samples taken after fasting for at least 8 hours.   BUN 06/07/2022 8  6 - 20 mg/dL Final   Creatinine, Ser 06/07/2022 0.84  0.44 - 1.00 mg/dL Final   Calcium 20/94/7096 9.1  8.9 - 10.3 mg/dL Final   Total Protein 28/36/6294 6.9  6.5 - 8.1 g/dL Final   Albumin 76/54/6503 3.7  3.5 - 5.0 g/dL Final   AST 54/65/6812 19  15 - 41 U/L Final   ALT 06/07/2022 22  0 - 44 U/L Final   Alkaline Phosphatase 06/07/2022 54  38 - 126 U/L Final   Total Bilirubin 06/07/2022 0.4  0.3 - 1.2 mg/dL Final   GFR, Estimated 06/07/2022 >60  >60 mL/min Final   Comment: (NOTE) Calculated using the CKD-EPI Creatinine Equation (2021)    Anion gap 06/07/2022 7  5 - 15 Final   Performed at [facility], [address] [place], [place] [address]   Hgb A1c MFr Bld 06/07/2022 5.0  4.8 - 5.6 % Final   Comment: (NOTE) Pre diabetes:          5.7%-6.4%  Diabetes:              >6.4%  Glycemic control for   <7.0% adults with diabetes    Mean Plasma Glucose 06/07/2022 96.8  mg/dL Final   Performed at [facility], [address], [place], [place] [address]   TSH 06/07/2022 1.620  0.350 - 4.500 uIU/mL Final   Comment: Performed by [name] 3rd Generation assay with [name] functional sensitivity of <=0.01 uIU/mL. Performed at [facility], [address], [place], [place] [address]    RPR Ser Ql 06/07/2022 NON REACTIVE  NON REACTIVE Final   Performed at [facility], [address], [place], [place] [address]   Color, Urine 06/07/2022 YELLOW  YELLOW Final   APPearance  06/07/2022 HAZY ([name])  CLEAR Final   Specific Gravity, Urine 06/07/2022 1.018  1.005 - 1.030 Final   pH 06/07/2022 7.0  5.0 - 8.0 Final   Glucose, UA 06/07/2022 NEGATIVE  NEGATIVE mg/dL Final   Hgb urine dipstick 06/07/2022 NEGATIVE  NEGATIVE Final   Bilirubin Urine 06/07/2022 NEGATIVE  NEGATIVE Final   Ketones, ur 06/07/2022 NEGATIVE  NEGATIVE mg/dL Final   Protein, ur 84/66/5993 NEGATIVE  NEGATIVE mg/dL Final   Nitrite 57/10/7791 NEGATIVE  NEGATIVE Final   Leukocytes,Ua 06/07/2022 NEGATIVE  NEGATIVE Final   Performed at [facility], [address], [place], [place] [address]   Cholesterol 06/07/2022 164  0 - 200 mg/dL Final   Triglycerides 92/33/0076 158 (H)  <150 mg/dL Final   HDL 22/63/3354 44  >40 mg/dL Final   Total CHOL/HDL Ratio 06/07/2022 3.7  RATIO Final  VLDL 06/07/2022 32  0 - 40 mg/dL Final   LDL Cholesterol 06/07/2022 88  0 - 99 mg/dL Final   Comment:        Total Cholesterol/HDL:CHD Risk Coronary Heart Disease Risk Table                     Men   Women  1/2 Average Risk   3.4   3.3  Average Risk       5.0   4.4  2 X Average Risk   9.6   7.1  3 X Average Risk  23.4   11.0        Use the calculated Patient Ratio above and the CHD Risk Table to determine the patient's CHD Risk.        ATP III CLASSIFICATION (LDL):  <100     mg/dL   Optimal  737-106  mg/dL   Near or Above                    Optimal  130-159  mg/dL   Borderline  269-485  mg/dL   High  >462     mg/dL   Very High Performed at [facility], [address], [place], [place] [address]    HIV Screen 4th Generation wRfx 06/07/2022 Non Reactive  Non Reactive Final   Performed at [facility], [address], [place], [place] [address]   SARSCOV2ONAVIRUS 2 AG 06/07/2022 NEGATIVE  NEGATIVE Final   Comment: (NOTE) SARS-CoV-2 antigen NOT DETECTED.   Negative results are presumptive.  Negative results do not preclude SARS-CoV-2 infection and should not be used as the sole basis  for treatment or other patient management decisions, including infection  control decisions, particularly in the presence of clinical signs and  symptoms consistent with COVID-19, or in those who have been in contact with the virus.  Negative results must be combined with clinical observations, patient history, and epidemiological information. The expected result is Negative.  Fact Sheet for Patients: https://www.jennings-kim.com/  Fact Sheet for Healthcare Providers: https://alexander-rogers.biz/  This test is not yet approved or cleared by the [place] FDA and  has been authorized for detection and/or diagnosis of SARS-CoV-2 by FDA under an Emergency Use Authorization (EUA).  This EUA will remain in effect (meaning this test can be used) for the duration of  the COV                          ID-19 declaration under Section 564(b)(1) of the Act, 21 U.S.C. section 360bbb-3(b)(1), unless the authorization is terminated or revoked sooner.     POC Amphetamine UR 06/07/2022 None Detected  NONE DETECTED (Cut Off Level 1000 ng/mL) Final   POC Secobarbital (BAR) 06/07/2022 None Detected  NONE DETECTED (Cut Off Level 300 ng/mL) Final   POC Buprenorphine (BUP) 06/07/2022 None Detected  NONE DETECTED (Cut Off Level 10 ng/mL) Final   POC Oxazepam (BZO) 06/07/2022 None Detected  NONE DETECTED (Cut Off Level 300 ng/mL) Final   POC Cocaine UR 06/07/2022 None Detected  NONE DETECTED (Cut Off Level 300 ng/mL) Final   POC Methamphetamine UR 06/07/2022 None Detected  NONE DETECTED (Cut Off Level 1000 ng/mL) Final   POC Morphine 06/07/2022 None Detected  NONE DETECTED (Cut Off Level 300 ng/mL) Final   POC Methadone UR 06/07/2022 None Detected  NONE DETECTED (Cut Off Level 300 ng/mL) Final   POC Oxycodone UR 06/07/2022  None Detected  NONE DETECTED (Cut Off Level 100 ng/mL) Final   POC Marijuana UR 06/07/2022 None Detected  NONE DETECTED (Cut Off Level 50 ng/mL) Final   Preg  Test, Ur 06/08/2022 NEGATIVE  NEGATIVE Final   Comment:        THE SENSITIVITY OF THIS METHODOLOGY IS >20 mIU/mL. Performed at [facility], [address], [place], [place] [address]    Preg Test, Ur 06/08/2022 NEGATIVE  NEGATIVE Final   Comment:        THE SENSITIVITY OF THIS METHODOLOGY IS >24 mIU/mL   Admission on 02/07/2022, Discharged on 02/08/2022  Component Date Value Ref Range Status   SARS Coronavirus 2 by RT PCR 02/07/2022 NEGATIVE  NEGATIVE Final   Comment: (NOTE) SARS-CoV-2 target nucleic acids are NOT DETECTED.  The SARS-CoV-2 RNA is generally detectable in upper respiratory specimens during the acute phase of infection. The lowest concentration of SARS-CoV-2 viral copies this assay can detect is 138 copies/mL. [name] negative result does not preclude SARS-Cov-2 infection and should not be used as the sole basis for treatment or other patient management decisions. [name] negative result may occur with  improper specimen collection/handling, submission of specimen other than nasopharyngeal swab, presence of viral mutation(s) within the areas targeted by this assay, and inadequate number of viral copies(<138 copies/mL). [name] negative result must be combined with clinical observations, patient history, and epidemiological information. The expected result is Negative.  Fact Sheet for Patients:  BloggerCourse.com  Fact Sheet for Healthcare Providers:  SeriousBroker.it  This test is no                          t yet approved or cleared by the [place] FDA and  has been authorized for detection and/or diagnosis of SARS-CoV-2 by FDA under an Emergency Use Authorization (EUA). This EUA will remain  in effect (meaning this test can be used) for the duration of the COVID-19 declaration under Section 564(b)(1) of the Act, 21 U.S.C.section 360bbb-3(b)(1), unless the authorization is terminated  or revoked sooner.        Influenza [name] by PCR 02/07/2022 NEGATIVE  NEGATIVE Final   Influenza B by PCR 02/07/2022 NEGATIVE  NEGATIVE Final   Comment: (NOTE) The Xpert Xpress SARS-CoV-2/FLU/RSV plus assay is intended as an aid in the diagnosis of influenza from Nasopharyngeal swab specimens and should not be used as [name] sole basis for treatment. Nasal washings and aspirates are unacceptable for Xpert Xpress SARS-CoV-2/FLU/RSV testing.  Fact Sheet for Patients: BloggerCourse.com  Fact Sheet for Healthcare Providers: SeriousBroker.it  This test is not yet approved or cleared by the [place] FDA and has been authorized for detection and/or diagnosis of SARS-CoV-2 by FDA under an Emergency Use Authorization (EUA). This EUA will remain in effect (meaning this test can be used) for the duration of the COVID-19 declaration under Section 564(b)(1) of the Act, 21 U.S.C. section 360bbb-3(b)(1), unless the authorization is terminated or revoked.  Performed at [facility], [address], [place], [place] [address]    WBC 02/07/2022 8.1  4.0 - 10.5 K/uL Final   RBC 02/07/2022 4.55  3.87 - 5.11 MIL/uL Final   Hemoglobin 02/07/2022 13.6  12.0 - 15.0 g/dL Final   HCT 09/81/1914 41.9  36.0 - 46.0 % Final   MCV 02/07/2022 92.1  80.0 - 100.0 fL Final   MCH 02/07/2022 29.9  26.0 - 34.0 pg Final   MCHC 02/07/2022 32.5  30.0 - 36.0 g/dL Final   RDW 16/07/9603 12.9  11.5 - 15.5 % Final   Platelets 02/07/2022 286  150 - 400 K/uL Final   nRBC 02/07/2022 0.0  0.0 - 0.2 % Final   Neutrophils Relative % 02/07/2022 41  % Final   Neutro Abs 02/07/2022 3.3  1.7 - 7.7 K/uL Final   Lymphocytes Relative 02/07/2022 53  % Final   Lymphs Abs 02/07/2022 4.3 (H)  0.7 - 4.0 K/uL Final   Monocytes Relative 02/07/2022 6  % Final   Monocytes Absolute 02/07/2022 0.5  0.1 - 1.0 K/uL Final   Eosinophils Relative 02/07/2022 0  % Final   Eosinophils Absolute 02/07/2022 0.0  0.0 - 0.5 K/uL  Final   Basophils Relative 02/07/2022 0  % Final   Basophils Absolute 02/07/2022 0.0  0.0 - 0.1 K/uL Final   Immature Granulocytes 02/07/2022 0  % Final   Abs Immature Granulocytes 02/07/2022 0.02  0.00 - 0.07 K/uL Final   Performed at [facility], [address], [place], [place] [address]   Sodium 02/07/2022 137  135 - 145 mmol/L Final   Potassium 02/07/2022 4.1  3.5 - 5.1 mmol/L Final   Chloride 02/07/2022 104  98 - 111 mmol/L Final   CO2 02/07/2022 29  22 - 32 mmol/L Final   Glucose, Bld 02/07/2022 92  70 - 99 mg/dL Final   Glucose reference range applies only to samples taken after fasting for at least 8 hours.   BUN 02/07/2022 10  6 - 20 mg/dL Final   Creatinine, Ser 02/07/2022 0.88  0.44 - 1.00 mg/dL Final   Calcium 11/91/4782 9.4  8.9 - 10.3 mg/dL Final   Total Protein 95/62/1308 7.2  6.5 - 8.1 g/dL Final   Albumin 65/78/4696 3.9  3.5 - 5.0 g/dL Final   AST 29/52/8413 18  15 - 41 U/L Final   ALT 02/07/2022 25  0 - 44 U/L Final   Alkaline Phosphatase 02/07/2022 66  38 - 126 U/L Final   Total Bilirubin 02/07/2022 0.2 (L)  0.3 - 1.2 mg/dL Final   GFR, Estimated 02/07/2022 >60  >60 mL/min Final   Comment: (NOTE) Calculated using the CKD-EPI Creatinine Equation (2021)    Anion gap 02/07/2022 4 (L)  5 - 15 Final   Performed at [facility], [address], [place], [place] [address]   Hgb A1c MFr Bld 02/07/2022 5.0  4.8 - 5.6 % Final   Comment: (NOTE) Pre diabetes:          5.7%-6.4%  Diabetes:              >6.4%  Glycemic control for   <7.0% adults with diabetes    Mean Plasma Glucose 02/07/2022 96.8  mg/dL Final   Performed at [facility], [address] [place], [place] [address]   Alcohol, Ethyl (B) 02/07/2022 <10  <10 mg/dL Final   Comment: (NOTE) Lowest detectable limit for serum alcohol is 10 mg/dL.  For medical purposes only. Performed at [facility], [address], [place], [place] [address]    Cholesterol 02/07/2022 192  0 - 200  mg/dL Final   Triglycerides 03/47/4259 192 (H)  <150 mg/dL Final   HDL 56/38/7564 46  >40 mg/dL Final   Total CHOL/HDL Ratio 02/07/2022 4.2  RATIO Final   VLDL 02/07/2022 38  0 - 40 mg/dL Final   LDL Cholesterol 02/07/2022 108 (H)  0 - 99 mg/dL Final  Comment:        Total Cholesterol/HDL:CHD Risk Coronary Heart Disease Risk Table                     Men   Women  1/2 Average Risk   3.4   3.3  Average Risk       5.0   4.4  2 X Average Risk   9.6   7.1  3 X Average Risk  23.4   11.0        Use the calculated Patient Ratio above and the CHD Risk Table to determine the patient's CHD Risk.        ATP III CLASSIFICATION (LDL):  <100     mg/dL   Optimal  409-811  mg/dL   Near or Above                    Optimal  130-159  mg/dL   Borderline  914-782  mg/dL   High  >956     mg/dL   Very High Performed at [facility], [address] [place], [place] [address]    TSH 02/07/2022 6.344 (H)  0.350 - 4.500 uIU/mL Final   Comment: Performed by [name] 3rd Generation assay with [name] functional sensitivity of <=0.01 uIU/mL. Performed at [facility], [address] [place], [place] [address]    POC Amphetamine UR 02/07/2022 None Detected  NONE DETECTED (Cut Off Level 1000 ng/mL) Final   POC Secobarbital (BAR) 02/07/2022 None Detected  NONE DETECTED (Cut Off Level 300 ng/mL) Final   POC Buprenorphine (BUP) 02/07/2022 None Detected  NONE DETECTED (Cut Off Level 10 ng/mL) Final   POC Oxazepam (BZO) 02/07/2022 None Detected  NONE DETECTED (Cut Off Level 300 ng/mL) Final   POC Cocaine UR 02/07/2022 None Detected  NONE DETECTED (Cut Off Level 300 ng/mL) Final   POC Methamphetamine UR 02/07/2022 None Detected  NONE DETECTED (Cut Off Level 1000 ng/mL) Final   POC Morphine 02/07/2022 None Detected  NONE DETECTED (Cut Off Level 300 ng/mL) Final   POC Oxycodone UR 02/07/2022 None Detected  NONE DETECTED (Cut Off Level 100 ng/mL) Final   POC Methadone UR 02/07/2022 None Detected  NONE DETECTED (Cut  Off Level 300 ng/mL) Final   POC Marijuana UR 02/07/2022 None Detected  NONE DETECTED (Cut Off Level 50 ng/mL) Final   SARSCOV2ONAVIRUS 2 AG 02/07/2022 NEGATIVE  NEGATIVE Final   Comment: (NOTE) SARS-CoV-2 antigen NOT DETECTED.   Negative results are presumptive.  Negative results do not preclude SARS-CoV-2 infection and should not be used as the sole basis for treatment or other patient management decisions, including infection  control decisions, particularly in the presence of clinical signs and  symptoms consistent with COVID-19, or in those who have been in contact with the virus.  Negative results must be combined with clinical observations, patient history, and epidemiological information. The expected result is Negative.  Fact Sheet for Patients: https://www.jennings-kim.com/  Fact Sheet for Healthcare Providers: https://alexander-rogers.biz/  This test is not yet approved or cleared by the [place] FDA and  has been authorized for detection and/or diagnosis of SARS-CoV-2 by FDA under an Emergency Use Authorization (EUA).  This EUA will remain in effect (meaning this test can be used) for the duration of  the COV                          ID-19 declaration under Section  564(b)(1) of the Act, 21 U.S.C. section 360bbb-3(b)(1), unless the authorization is terminated or revoked sooner.     Preg Test, Ur 02/07/2022 NEGATIVE  NEGATIVE Final   Comment:        THE SENSITIVITY OF THIS METHODOLOGY IS >24 mIU/mL     Blood Alcohol level:  Lab Results  Component Value Date   ETH <10 02/07/2022    Metabolic Disorder Labs: Lab Results  Component Value Date   HGBA1C 5.0 06/07/2022   MPG 96.8 06/07/2022   MPG 96.8 02/07/2022   No results found for: "PROLACTIN" Lab Results  Component Value Date   CHOL 164 06/07/2022   TRIG 158 (H) 06/07/2022   HDL 44 06/07/2022   CHOLHDL 3.7 06/07/2022   VLDL 32 06/07/2022   LDLCALC 88 06/07/2022   LDLCALC  108 (H) 02/07/2022    Therapeutic Lab Levels: No results found for: "LITHIUM" No results found for: "VALPROATE" No results found for: "CBMZ"  Physical Findings   GAD-7    Flowsheet Row Office [facility]17/[facility]  Total GAD-7 Score 17      PHQ2-9    Flowsheet Row Office Visit [facility]17/[facility]  PHQ-2 Total Score 2  PHQ-9 Total Score 8      Flowsheet Row ED from 06/07/2022 in [facility] ED from 02/07/2022 in [facility] ED from 12/02/2021 in [facility] EMERGENCY DEPARTMENT  C-SSRS RISK CATEGORY High Risk No Risk No Risk        Musculoskeletal  Strength & Muscle Tone: within normal limits Gait & Station: normal Patient leans: N/[name]  Psychiatric Specialty Exam  Presentation  General Appearance: Appropriate for Environment; Fairly Groomed  Eye Contact:Fair  Speech:Clear and Coherent  Speech Volume:Normal  Handedness:Right   Mood and Affect  Mood:Depressed  Affect:Congruent   Thought Process  Thought Processes:Coherent  Descriptions of Associations:Intact  Orientation:Full (Time, Place and Person)  Thought Content:Illogical  Diagnosis of Schizophrenia or Schizoaffective disorder in past: Yes  Duration of Psychotic Symptoms: Greater than six months   Hallucinations:Hallucinations: None  Ideas of Reference:None  Suicidal Thoughts:Suicidal Thoughts: No  Homicidal Thoughts:Homicidal Thoughts: No   Sensorium  Memory:Immediate Fair; Recent Fair; Remote Fair  Judgment:Fair  Insight:Fair   Executive Functions  Concentration:Fair  Attention Span:Fair  Recall:Fair  Fund of Knowledge:Fair  Language:Fair   Psychomotor Activity  Psychomotor Activity:Psychomotor Activity: Normal   Assets  Assets:Physical Health; Resilience; Financial Resources/Insurance   Sleep  Sleep:Sleep:  Fair   Nutritional Assessment (For OBS and FBC admissions only) Has the patient had [name] weight loss or gain of 10 pounds or more in the last 3 months?: No Has the patient had [name] decrease in food intake/or appetite?: No Does the patient have dental problems?: No Does the patient have eating habits or behaviors that may be indicators of an eating disorder including binging or inducing vomiting?: No Has the patient recently lost weight without trying?: 0 Has the patient been eating poorly because of [name] decreased appetite?: 0 Malnutrition Screening Tool Score: 0    Physical Exam  Physical Exam Vitals and nursing note reviewed.  Skin:    General: Skin is warm and dry.  Neurological:     Mental Status: She is alert.  Psychiatric:        Mood and Affect: Mood normal.        Thought Content: Thought content normal.    Review  of Systems  Psychiatric/Behavioral:  Positive for depression. Negative for hallucinations and suicidal ideas. The patient is nervous/anxious.   All other systems reviewed and are negative.  Blood pressure 105/88, pulse (!) 101, temperature 98.2 F (36.8 C), temperature source Oral, resp. rate 20, SpO2 99 %. There is no height or weight on file to calculate BMI.  Treatment Plan Summary: Daily contact with patient to assess and evaluate symptoms and progress in treatment CSW to continue working on discharge disposition Patient was psych cleared 06/09/2019.  Oneta Rack, [name] 06/09/2022 11:40 AM

## 2022-06-09 NOTE — ED Notes (Signed)
Nicole [name] remains asleep with no sleep disturbance or distress noted , respirations are 16 per minute noted by pt snoring, and her skin color is appropriate for pt ethnicity.

## 2022-06-09 NOTE — ED Notes (Signed)
Patient calmly laying in bed resting. No questions/concerns at this time.

## 2022-06-09 NOTE — ED Notes (Signed)
Pt sleeping no distress noted respirations are easy and ski color WNL.

## 2022-06-10 DIAGNOSIS — F32A Depression, unspecified: Secondary | ICD-10-CM | POA: Diagnosis not present

## 2022-06-10 DIAGNOSIS — R45851 Suicidal ideations: Secondary | ICD-10-CM | POA: Diagnosis not present

## 2022-06-10 DIAGNOSIS — Z79899 Other long term (current) drug therapy: Secondary | ICD-10-CM | POA: Diagnosis not present

## 2022-06-10 DIAGNOSIS — F209 Schizophrenia, unspecified: Secondary | ICD-10-CM | POA: Diagnosis not present

## 2022-06-10 DIAGNOSIS — Z20822 Contact with and (suspected) exposure to covid-19: Secondary | ICD-10-CM | POA: Diagnosis not present

## 2022-06-10 DIAGNOSIS — R45850 Homicidal ideations: Secondary | ICD-10-CM | POA: Diagnosis not present

## 2022-06-10 NOTE — ED Notes (Signed)
Patient resting quietly in bed with eyes closed. Respirations equal and unlabored, skin warm and dry, NAD. No change in assessment or acuity. Routine safety checks conducted according to facility protocol. Will continue to monitor for safety.   

## 2022-06-10 NOTE — ED Notes (Signed)
Pt sleeping at present, no distress noted.  Monitoring for safety. 

## 2022-06-10 NOTE — ED Notes (Signed)
[name]&Ox4. Patient is calm and cooperative. Patient denies SI/HI and AVH. Patient denies any physical complaints when asked. No acute distress noted. Support and encouragement provided. Routine safety checks conducted according to facility protocol. Encouraged patient to notify staff if thoughts of harm toward self or others arise. Patient verbalize understanding and agreement. Will continue to monitor for safety.    

## 2022-06-10 NOTE — ED Notes (Signed)
Patient refused am vitals  

## 2022-06-10 NOTE — ED Provider Notes (Addendum)
FBC/OBS ASAP Discharge Summary  Date and Time: 06/10/2022 9:55 AM  Name: Nicole [name]  MRN:  [id number]   Discharge Diagnoses:  Final diagnoses:  Schizophrenia, unspecified type [facility])    Subjective: Nicole [name] 31 y.o. female patient who presented to [facility] voluntarily from her group Home [facility]) with complaints of racing thoughts, auditory hallucinations, and passive suicidal and homicidal ideation.       Stay Summary: Shawnn Rottman was admitted for Schizophrenia, unspecified [facility]), crisis management, safety, and stabilization.  Medical problems were identified and treated as needed.  Home medications were restarted, adjusted, or new medications added as needed or appropriate.  Medications treated with during admission are as follows.  fluticasone  1 spray Each Nare Daily   hydrOXYzine  50 mg Oral BID   Oxcarbazepine  300 mg Oral BID   QUEtiapine  400 mg Oral BID   sertraline  150 mg Oral Daily   valACYclovir  500 mg Oral Daily    Labs ordered for review:  Lab Orders         Resp Panel by RT-PCR (Flu A&B, Covid) Anterior Nasal Swab         CBC with Differential/Platelet         Comprehensive metabolic panel         Hemoglobin A1c         TSH         RPR         Urinalysis, Routine w reflex microscopic         Pregnancy, urine         Lipid panel         HIV Antibody (routine testing w rflx)         Rapid urine drug screen (hospital performed)         Urine drugs of abuse scrn w alc, routine (Ref Lab)         Pregnancy, urine         POC urine preg         POC SARS Coronavirus 2 Ag         POCT Urine Drug Screen         POCT Urine Drug Screen         POCT Urine Drug Screen         Pregnancy, urine POC     Improvement was monitored by continuous observation and Nicole [name] 's daily verbal report or emotional status and symptom reduction along with clinical staff report.  Tatum Novosel was evaluated by the treatment team for stability and plans for continued  recovery upon discharge. Nicole [name] 's motivation was an integral factor toward her returning back to group home.  She will follow up with her current psychiatric provider.     Discharge Instructions      Follow up with current outpatient psychiatric provider for medication management.     Upon completion of this admission Jaylin Kuzminski was both mentally and medically stable for discharge denying suicidal/homicidal ideation, auditory/visual/tactile hallucinations, delusional thoughts, and paranoia.    Collateral Information:  Spoke to [employer] (legal guardian)  States that she will be at the [facility] which is where patient usually is during the day and that patient can be sent there via taxi to [place], [place], [place] [address].  "She also know the address and where it is."    Total Time spent with patient: 30 minutes  Past Psychiatric History: Schizoaffective disorder, schizophrenia Past Medical History:  Past Medical History:  Diagnosis Date   Anxiety    Depression     Past Surgical History:  Procedure Laterality Date   WISDOM TOOTH EXTRACTION Bilateral 2020   Family History:  Family History  Problem Relation Age of Onset   Hypertension Father    Diabetes Father    Family Psychiatric History: Unaware Social History:  Social History   Substance and Sexual Activity  Alcohol Use Never     Social History   Substance and Sexual Activity  Drug Use Never    Social History   Socioeconomic History   Marital status: Single    Spouse name: Not on file   Number of children: Not on file   Years of education: Not on file   Highest education level: Not on file  Occupational History   Not on file  Tobacco Use   Smoking status: Every Day    Types: Cigarettes   Smokeless tobacco: Never  Vaping Use   Vaping Use: Some days  Substance and Sexual Activity   Alcohol use: Never   Drug use: Never   Sexual activity: Yes     Partners: Female    Birth control/protection: Implant  Other Topics Concern   Not on file  Social History Narrative   Not on file   Social Determinants of Health   Financial Resource Strain: Not on file  Food Insecurity: Not on file  Transportation Needs: Not on file  Physical Activity: Not on file  Stress: Not on file  Social Connections: Not on file   SDOH:  SDOH Screenings   Alcohol Screen: Not on file  Depression (PHQ2-9): Low Risk  (06/10/2022)   Depression (PHQ2-9)    PHQ-2 Score: 2  Financial Resource Strain: Not on file  Food Insecurity: Not on file  Housing: Not on file  Physical Activity: Not on file  Social Connections: Not on file  Stress: Not on file  Tobacco Use: High Risk (02/08/2022)   Patient History    Smoking Tobacco Use: Every Day    Smokeless Tobacco Use: Never    Passive Exposure: Not on file  Transportation Needs: Not on file    Tobacco Cessation:  A prescription for an FDA-approved tobacco cessation medication was offered at discharge and the patient refused  Current Medications:  Current Facility-Administered Medications  Medication Dose Route Frequency Provider Last Rate Last Admin   acetaminophen (TYLENOL) tablet 650 mg  650 mg Oral Q6H PRN Starleen Blue, NP   650 mg at 06/08/22 0916   alum & mag hydroxide-simeth (MAALOX/MYLANTA) 200-200-20 MG/5ML suspension 30 mL  30 mL Oral Q4H PRN Starleen Blue, NP       fluticasone (FLONASE) 50 MCG/ACT nasal spray 1 spray  1 spray Each Nare Daily Leevy-Johnson, Brooke A, NP   1 spray at 06/10/22 0914   hydrOXYzine (ATARAX) tablet 50 mg  50 mg Oral TID PRN Starleen Blue, NP       hydrOXYzine (ATARAX) tablet 50 mg  50 mg Oral BID Leevy-Johnson, Brooke A, NP   50 mg at 06/10/22 0913   magnesium hydroxide (MILK OF MAGNESIA) suspension 30 mL  30 mL Oral Daily PRN Starleen Blue, NP   30 mL at 06/10/22 0918   ondansetron (ZOFRAN-ODT) disintegrating tablet 4 mg  4 mg Oral BID PRN Leevy-Johnson, Brooke A, NP        Oxcarbazepine (TRILEPTAL) tablet 300 mg  300 mg Oral BID Leevy-Johnson, Brooke  A, NP   300 mg at 06/10/22 0913   QUEtiapine (SEROQUEL) tablet 400 mg  400 mg Oral BID Leevy-Johnson, Brooke A, NP   400 mg at 06/10/22 0913   sertraline (ZOLOFT) tablet 150 mg  150 mg Oral Daily Leevy-Johnson, Brooke A, NP   150 mg at 06/10/22 0913   traZODone (DESYREL) tablet 100 mg  100 mg Oral QHS PRN Leevy-Johnson, Brooke A, NP       valACYclovir (VALTREX) tablet 500 mg  500 mg Oral Daily Leevy-Johnson, Brooke A, NP   500 mg at 06/10/22 0913   Current Outpatient Medications  Medication Sig Dispense Refill   ABILIFY MAINTENA 400 MG PRSY prefilled syringe 400 mg every 28 (twenty-eight) days.     cetirizine (ZYRTEC) 10 MG tablet Take 10 mg by mouth daily.     fluticasone (FLONASE) 50 MCG/ACT nasal spray Place 1 spray into both nostrils daily.     hydrOXYzine (ATARAX/VISTARIL) 50 MG tablet Take 50 mg by mouth 2 (two) times daily.     ibuprofen (ADVIL) 800 MG tablet Take 800 mg by mouth 2 (two) times daily as needed (For pain).     meloxicam (MOBIC) 15 MG tablet Take 15 mg by mouth daily.     Oxcarbazepine (TRILEPTAL) 300 MG tablet Take 300 mg by mouth 2 (two) times daily.     QUEtiapine (SEROQUEL) 400 MG tablet Take 400 mg by mouth 2 (two) times daily.     sertraline (ZOLOFT) 50 MG tablet Take 150 mg by mouth daily.     traZODone (DESYREL) 100 MG tablet Take 100 mg by mouth at bedtime.     valACYclovir (VALTREX) 500 MG tablet Take 500 mg by mouth daily.      PTA Medications: (Not in a hospital admission)      06/10/2022    9:53 AM 12/17/2021   11:03 AM  Depression screen PHQ 2/9  Decreased Interest 0 1  Down, Depressed, Hopeless 1 1  PHQ - 2 Score 1 2  Altered sleeping 0 0  Tired, decreased energy 0 3  Change in appetite 0 0  Feeling bad or failure about yourself  0 0  Trouble concentrating 1 3  Moving slowly or fidgety/restless 0 0  Suicidal thoughts 0 0  PHQ-9 Score 2 8  Difficult doing  work/chores Not difficult at all     Flowsheet Row ED from 06/07/2022 in [facility] ED from 02/07/2022 in [facility] ED from 12/02/2021 in [facility] EMERGENCY DEPARTMENT  C-SSRS RISK CATEGORY High Risk No Risk No Risk       Musculoskeletal  Strength & Muscle Tone: within normal limits Gait & Station: normal Patient leans: N/A  Psychiatric Specialty Exam  Presentation  General Appearance: Appropriate for Environment  Eye Contact:Good  Speech:Clear and Coherent; Normal Rate  Speech Volume:Normal  Handedness:Right   Mood and Affect  Mood:Euthymic  Affect:Appropriate; Congruent   Thought Process  Thought Processes:Coherent; Goal Directed  Descriptions of Associations:Intact  Orientation:Full (Time, Place and Person)  Thought Content:Logical  Diagnosis of Schizophrenia or Schizoaffective disorder in past: Yes  Duration of Psychotic Symptoms: Greater than six months   Hallucinations:Hallucinations: None  Ideas of Reference:None  Suicidal Thoughts:Suicidal Thoughts: No  Homicidal Thoughts:Homicidal Thoughts: No   Sensorium  Memory:Immediate Fair; Recent Fair  Judgment:Intact  Insight:Present   Executive Functions  Concentration:Good  Attention Span:Good  Recall:Good  Fund of Knowledge:Good  Language:Good   Psychomotor Activity  Psychomotor Activity:Psychomotor Activity:  Normal   Assets  Assets:Communication Skills; Desire for Improvement; Financial Resources/Insurance; Housing; Social Support   Sleep  Sleep:Sleep: Good   Nutritional Assessment (For OBS and FBC admissions only) Has the patient had a weight loss or gain of 10 pounds or more in the last 3 months?: No Has the patient had a decrease in food intake/or appetite?: No Does the patient have dental problems?: No Does the patient have eating habits or behaviors that may be indicators of an eating disorder  including binging or inducing vomiting?: No Has the patient recently lost weight without trying?: 0 Has the patient been eating poorly because of a decreased appetite?: 0 Malnutrition Screening Tool Score: 0    Physical Exam  Physical Exam Vitals and nursing note reviewed. Exam conducted with a chaperone present.  Constitutional:      General: She is not in acute distress.    Appearance: Normal appearance. She is not ill-appearing.  Cardiovascular:     Rate and Rhythm: Normal rate.  Pulmonary:     Effort: Pulmonary effort is normal.  Skin:    General: Skin is warm and dry.  Neurological:     Mental Status: She is alert and oriented to person, place, and time.  Psychiatric:        Attention and Perception: Attention and perception normal. She does not perceive auditory or visual hallucinations.        Mood and Affect: Mood and affect normal.        Speech: Speech normal.        Behavior: Behavior normal. Behavior is cooperative.        Thought Content: Thought content normal. Thought content is not paranoid or delusional. Thought content does not include homicidal or suicidal ideation.        Judgment: Judgment is impulsive.    Review of Systems  Constitutional: Negative.   Respiratory: Negative.    Cardiovascular: Negative.   Gastrointestinal: Negative.  Negative for abdominal pain, nausea and vomiting.  Genitourinary: Negative.   Musculoskeletal: Negative.   Skin: Negative.   Neurological: Negative.   Psychiatric/Behavioral:  Depression: Stable. Hallucinations: Denies. Substance abuse: Denies. Suicidal ideas: Denies. Nervous/anxious: Denies. Insomnia: Denies.    Blood pressure 111/74, pulse 98, temperature 98.5 F (36.9 C), temperature source Oral, resp. rate 18, SpO2 99 %. There is no height or weight on file to calculate BMI.  Demographic Factors:  Black female  Loss Factors: NA  Historical Factors: Impulsivity  Risk Reduction Factors:   Religious beliefs  about death, Living with another person, especially a relative, Positive social support, and Positive therapeutic relationship  Continued Clinical Symptoms:  Schizophrenia, unspecified type  Cognitive Features That Contribute To Risk:  None    Suicide Risk:  Minimal: No identifiable suicidal ideation.  Patients presenting with no risk factors but with morbid ruminations; may be classified as minimal risk based on the severity of the depressive symptoms  Plan Of Care/Follow-up recommendations:  Other:  Follow up with current psychiatric provider and community support  Disposition: No evidence of imminent risk to self or others at present.   Patient does not meet criteria for psychiatric inpatient admission. Supportive therapy provided about ongoing stressors. Discussed crisis plan, support from social network, calling 911, coming to the Emergency Department, and calling Suicide Hotline.   Carlin Attridge, NP 06/10/2022, 9:55 AM

## 2022-06-10 NOTE — ED Notes (Signed)
Breakfast given.  

## 2022-06-10 NOTE — Discharge Instructions (Addendum)
Follow up with current outpatient psychiatric provider for medication management.

## 2022-06-13 ENCOUNTER — Telehealth: Payer: Self-pay | Admitting: Physician Assistant

## 2022-06-13 DIAGNOSIS — Z3200 Encounter for pregnancy test, result unknown: Secondary | ICD-10-CM

## 2022-06-13 DIAGNOSIS — M549 Dorsalgia, unspecified: Secondary | ICD-10-CM

## 2022-06-13 NOTE — Progress Notes (Signed)
For the safety of you and your potential child, I recommend a face to face office visit with a health care provider. You need exam and pregnancy test to confirm or rule out pregnancy and so proper and optimal treatment can be given.   NOTE:  There will be NO CHARGE for this eVisit  If you are having a true medical emergency please call 911.    For an urgent face to face visit,  has six urgent care centers for your convenience:     [facility] Health Urgent Care Center at [facility] Directions [phone] [address] [place], [place] [address]    [facility] Health Urgent Care Center [facility]) Get Driving Directions [phone] [address] [place], [place] [address]  [facility] Health Urgent Care Center [facility]) Get Driving Directions [phone] [address] [place],  [place]  [address]  [facility] at [facility] Get Driving Directions [phone] [address] [place], [place] [address]   [facility] Health Urgent Care at [facility] Get Driving Directions  [phone] [address] [place], [place] [address]   [facility] Health Urgent Care at [facility] Directions [phone] [address], [address] [place], [place] [address]  Your MyChart E-visit questionnaire answers were reviewed by a board certified advanced clinical practitioner to complete your personal care plan based on your specific symptoms.  Thank you for using e-Visits.

## 2022-06-14 ENCOUNTER — Ambulatory Visit
Admission: EM | Admit: 2022-06-14 | Discharge: 2022-06-14 | Disposition: A | Discharge status: Home / Self Care | Payer: Medicare HMO | Attending: Physician Assistant | Admitting: Physician Assistant

## 2022-06-14 DIAGNOSIS — M545 Low back pain, unspecified: Secondary | ICD-10-CM | POA: Insufficient documentation

## 2022-06-14 DIAGNOSIS — R1030 Lower abdominal pain, unspecified: Secondary | ICD-10-CM | POA: Insufficient documentation

## 2022-06-14 LAB — POCT URINALYSIS DIP (MANUAL ENTRY)
Bilirubin, UA: NEGATIVE
Blood, UA: NEGATIVE
Glucose, UA: NEGATIVE mg/dL
Ketones, POC UA: NEGATIVE mg/dL
Leukocytes, UA: NEGATIVE
Nitrite, UA: NEGATIVE
Protein Ur, POC: 30 mg/dL — AB
Spec Grav, UA: 1.025 (ref 1.010–1.025)
Urobilinogen, UA: 0.2 E.U./dL
pH, UA: 7.5 (ref 5.0–8.0)

## 2022-06-14 LAB — POCT URINE PREGNANCY: Preg Test, Ur: NEGATIVE

## 2022-06-14 MED ORDER — CYCLOBENZAPRINE HCL 10 MG PO TABS
10.0000 mg | ORAL_TABLET | Freq: Two times a day (BID) | ORAL | 0 refills | Status: DC | PRN
Start: 2022-06-14 — End: 2022-11-22

## 2022-06-14 MED ORDER — PREDNISONE 20 MG PO TABS: 40.0000 mg | ORAL_TABLET | Freq: Every day | ORAL | 0 refills | Status: AC

## 2022-06-14 NOTE — ED Triage Notes (Signed)
Pt states back pain from the top to the bottom has been taking Tylenol and Motrin with no relief.  Pt also c/o lower abd pain and ringworm on her feet.

## 2022-06-14 NOTE — ED Provider Notes (Signed)
[facility]    CSN: [id number] Arrival date & time: 06/14/22  1552      History   Chief Complaint Chief Complaint  Patient presents with   Back Pain    HPI Nicole [name] is a 31 y.o. female.   Patient here today for evaluation of diffuse back pain that is been ongoing for the last several days. Pain is worse with certain movements. She reports that she has been taking Tylenol Motrin without significant relief.  She also reports some lower abdominal pain.  She denies any nausea, vomiting or diarrhea.  She has not had any numbness or tingling.  She initially requested telehealth visit however in her questioning reported possible pregnancy so they advised in person visit.  The history is provided by the patient.  Back Pain Associated symptoms: abdominal pain   Associated symptoms: no fever and no numbness     Past Medical History:  Diagnosis Date   Anxiety    Depression     Patient Active Problem List   Diagnosis Date Noted   Schizophrenia, unspecified (HCC) 06/10/2022   Nexplanon in place 12/17/2021    Past Surgical History:  Procedure Laterality Date   WISDOM TOOTH EXTRACTION Bilateral 2020    OB History     Gravida  1   Para  1   Term  1   Preterm  0   AB  0   Living  1      SAB      IAB      Ectopic      Multiple      Live Births               Home Medications    Prior to Admission medications   Medication Sig Start Date End Date Taking? Authorizing Provider  cyclobenzaprine (FLEXERIL) 10 MG tablet Take 1 tablet (10 mg total) by mouth 2 (two) times daily as needed for muscle spasms. 06/14/22  Yes Tomi Bamberger, PA-C  predniSONE (DELTASONE) 20 MG tablet Take 2 tablets (40 mg total) by mouth daily with breakfast for 5 days. 06/14/22 06/19/22 Yes Tomi Bamberger, PA-C  ABILIFY MAINTENA 400 MG PRSY prefilled syringe 400 mg every 28 (twenty-eight) days. 08/10/21   [provider]  cetirizine (ZYRTEC) 10 MG tablet Take  10 mg by mouth daily.    [provider]  fluticasone (FLONASE) 50 MCG/ACT nasal spray Place 1 spray into both nostrils daily. 08/22/21   [provider]  hydrOXYzine (ATARAX/VISTARIL) 50 MG tablet Take 50 mg by mouth 2 (two) times daily. 07/28/21   [provider]  ibuprofen (ADVIL) 800 MG tablet Take 800 mg by mouth 2 (two) times daily as needed (For pain).    [provider]  meloxicam (MOBIC) 15 MG tablet Take 15 mg by mouth daily. 08/22/21   [provider]  Oxcarbazepine (TRILEPTAL) 300 MG tablet Take 300 mg by mouth 2 (two) times daily. 08/22/21   [provider]  QUEtiapine (SEROQUEL) 400 MG tablet Take 400 mg by mouth 2 (two) times daily. 08/22/21   [provider]  sertraline (ZOLOFT) 50 MG tablet Take 150 mg by mouth daily. 08/22/21   [provider]  traZODone (DESYREL) 100 MG tablet Take 100 mg by mouth at bedtime. 08/22/21   [provider]  valACYclovir (VALTREX) 500 MG tablet Take 500 mg by mouth daily. 08/11/21   [provider]    Family History Family History  Problem Relation  Age of Onset   Hypertension Father    Diabetes Father     Social History Social History   Tobacco Use   Smoking status: Every Day    Types: Cigarettes   Smokeless tobacco: Never  Vaping Use   Vaping Use: Some days  Substance Use Topics   Alcohol use: Never   Drug use: Never     Allergies   Apple juice, Other, and Watermelon flavor   Review of Systems Review of Systems  Constitutional:  Negative for chills and fever.  Eyes:  Negative for discharge and redness.  Respiratory:  Negative for shortness of breath.   Gastrointestinal:  Positive for abdominal pain. Negative for diarrhea, nausea and vomiting.  Musculoskeletal:  Positive for back pain.  Neurological:  Negative for numbness.     Physical Exam Triage Vital Signs ED Triage Vitals  Enc Vitals Group     BP 06/14/22 1704 (!) 123/93      Pulse Rate 06/14/22 1702 97     Resp 06/14/22 1702 16     Temp 06/14/22 1702 98.1 F (36.7 C)     Temp Source 06/14/22 1702 Oral     SpO2 06/14/22 1702 97 %     Weight --      Height --      Head Circumference --      Peak Flow --      Pain Score 06/14/22 1701 8     Pain Loc --      Pain Edu? --      Excl. in GC? --    No data found.  Updated Vital Signs BP (!) 123/93 (BP Location: Left Arm)   Pulse 97   Temp 98.1 F (36.7 C) (Oral)   Resp 16   LMP 05/15/2022 (Approximate)   SpO2 97%      Physical Exam Vitals and nursing note reviewed.  Constitutional:      General: She is not in acute distress.    Appearance: Normal appearance. She is not ill-appearing.  HENT:     Head: Normocephalic and atraumatic.  Eyes:     Conjunctiva/sclera: Conjunctivae normal.  Cardiovascular:     Rate and Rhythm: Normal rate.  Pulmonary:     Effort: Pulmonary effort is normal.  Abdominal:     General: Abdomen is flat. Bowel sounds are normal. There is no distension.     Tenderness: There is abdominal tenderness (mild TTP diffusely to lower abdomen). There is no guarding or rebound.  Musculoskeletal:     Comments: No TTP to thoracic or lumbar spine, mild TTP to left low back  Neurological:     Mental Status: She is alert.  Psychiatric:        Mood and Affect: Mood normal.        Behavior: Behavior normal.        Thought Content: Thought content normal.      UC Treatments / Results  Labs (all labs ordered are listed, but only abnormal results are displayed) Labs Reviewed  POCT URINALYSIS DIP (MANUAL ENTRY) - Abnormal; Notable for the following components:      Result Value   Clarity, UA cloudy (*)    Protein Ur, POC =30 (*)    All other components within normal limits  URINE CULTURE  POCT URINE PREGNANCY    EKG   Radiology No results found.  Procedures Procedures (including critical care time)  Medications Ordered in UC Medications - No data to display  Initial  Impression / Assessment and Plan / UC Course  I have reviewed the triage vital signs and the nursing notes.  Pertinent labs & imaging results that were available during my care of the patient were reviewed by me and considered in my medical decision making (see chart for details).    UA without concern for UTI, pregnancy test negative. Suspect most likely muscular strain causing back pain- will treat with steroids and muscle relaxer. Advised that muscle relaxer may cause drowsiness and to use with caution. Unknown etiology of lower abdominal pain but no signs or symptoms of acute process at this time- recommended further evaluation in the ED with any worsening pain or development of other symptoms. Patient expresses understanding.  Final Clinical Impressions(s) / UC Diagnoses   Final diagnoses:  Lower abdominal pain  Acute bilateral low back pain, unspecified whether sciatica present   Discharge Instructions   None    ED Prescriptions     Medication Sig Dispense Auth. Provider   predniSONE (DELTASONE) 20 MG tablet Take 2 tablets (40 mg total) by mouth daily with breakfast for 5 days. 10 tablet Erma Pinto F, PA-C   cyclobenzaprine (FLEXERIL) 10 MG tablet Take 1 tablet (10 mg total) by mouth 2 (two) times daily as needed for muscle spasms. 20 tablet Tomi Bamberger, PA-C      PDMP not reviewed this encounter.   Tomi Bamberger, PA-C 06/14/22 1921

## 2022-06-16 LAB — URINE CULTURE

## 2022-06-26 DIAGNOSIS — F319 Bipolar disorder, unspecified: Secondary | ICD-10-CM | POA: Diagnosis not present

## 2022-06-26 DIAGNOSIS — N925 Other specified irregular menstruation: Secondary | ICD-10-CM | POA: Diagnosis not present

## 2022-06-26 DIAGNOSIS — F659 Paraphilia, unspecified: Secondary | ICD-10-CM | POA: Diagnosis not present

## 2022-07-04 ENCOUNTER — Encounter (HOSPITAL_COMMUNITY): Payer: Self-pay

## 2022-07-04 ENCOUNTER — Emergency Department (HOSPITAL_COMMUNITY)
Admission: EM | Admit: 2022-07-04 | Discharge: 2022-07-05 | Disposition: A | Discharge status: Home / Self Care | Payer: Medicare HMO | Attending: Emergency Medicine | Admitting: Emergency Medicine

## 2022-07-04 ENCOUNTER — Other Ambulatory Visit: Payer: Self-pay

## 2022-07-04 DIAGNOSIS — F17210 Nicotine dependence, cigarettes, uncomplicated: Secondary | ICD-10-CM | POA: Insufficient documentation

## 2022-07-04 DIAGNOSIS — Z79899 Other long term (current) drug therapy: Secondary | ICD-10-CM | POA: Diagnosis not present

## 2022-07-04 DIAGNOSIS — R457 State of emotional shock and stress, unspecified: Secondary | ICD-10-CM | POA: Diagnosis not present

## 2022-07-04 DIAGNOSIS — N9489 Other specified conditions associated with female genital organs and menstrual cycle: Secondary | ICD-10-CM | POA: Diagnosis not present

## 2022-07-04 DIAGNOSIS — F439 Reaction to severe stress, unspecified: Secondary | ICD-10-CM | POA: Diagnosis not present

## 2022-07-04 DIAGNOSIS — G4700 Insomnia, unspecified: Secondary | ICD-10-CM

## 2022-07-04 DIAGNOSIS — F419 Anxiety disorder, unspecified: Secondary | ICD-10-CM | POA: Diagnosis present

## 2022-07-04 LAB — COMPREHENSIVE METABOLIC PANEL
ALT: 28 U/L (ref 0–44)
AST: 23 U/L (ref 15–41)
Albumin: 3.9 g/dL (ref 3.5–5.0)
Alkaline Phosphatase: 77 U/L (ref 38–126)
Anion gap: 7 (ref 5–15)
BUN: 15 mg/dL (ref 6–20)
CO2: 26 mmol/L (ref 22–32)
Calcium: 9.3 mg/dL (ref 8.9–10.3)
Chloride: 109 mmol/L (ref 98–111)
Creatinine, Ser: 0.83 mg/dL (ref 0.44–1.00)
GFR, Estimated: >60 mL/min (ref >60)
Glucose, Bld: 96 mg/dL (ref 70–99)
Potassium: 4.4 mmol/L (ref 3.5–5.1)
Sodium: 142 mmol/L (ref 135–145)
Total Bilirubin: 0.4 mg/dL (ref 0.3–1.2)
Total Protein: 7.2 g/dL (ref 6.5–8.1)

## 2022-07-04 LAB — CBC WITH DIFFERENTIAL/PLATELET
Abs Immature Granulocytes: 0.01 10*3/uL (ref 0.00–0.07)
Basophils Absolute: 0 10*3/uL (ref 0.0–0.1)
Basophils Relative: 0 %
Eosinophils Absolute: 0 10*3/uL (ref 0.0–0.5)
Eosinophils Relative: 0 %
HCT: 39.1 % (ref 36.0–46.0)
Hemoglobin: 12.8 g/dL (ref 12.0–15.0)
Immature Granulocytes: 0 %
Lymphocytes Relative: 50 %
Lymphs Abs: 3.5 10*3/uL (ref 0.7–4.0)
MCH: 30.6 pg (ref 26.0–34.0)
MCHC: 32.7 g/dL (ref 30.0–36.0)
MCV: 93.5 fL (ref 80.0–100.0)
Monocytes Absolute: 0.5 10*3/uL (ref 0.1–1.0)
Monocytes Relative: 7 %
Neutro Abs: 3 10*3/uL (ref 1.7–7.7)
Neutrophils Relative %: 43 %
Platelets: 243 10*3/uL (ref 150–400)
RBC: 4.18 MIL/uL (ref 3.87–5.11)
RDW: 12.9 % (ref 11.5–15.5)
WBC: 7 10*3/uL (ref 4.0–10.5)
nRBC: 0 % (ref 0.0–0.2)

## 2022-07-04 LAB — ETHANOL: Alcohol, Ethyl (B): <10 mg/dL (ref <10)

## 2022-07-04 LAB — I-STAT BETA HCG BLOOD, ED (MC, WL, AP ONLY): I-stat hCG, quantitative: <5 m[IU]/mL (ref <5)

## 2022-07-04 NOTE — ED Triage Notes (Signed)
Pt BIB EMS from home. Pt c/o anxiety and restlessness x3 days. Pt unable to sleep well. Pt states she has been taking prescribed meds. Pt wants a mental health evaluation.

## 2022-07-04 NOTE — ED Provider Triage Note (Signed)
Emergency Medicine Provider Triage Evaluation Note  Nicole [name] , a 31 y.o. female  was evaluated in triage.  Pt complains of racing thoughts. She reports that she has been having "racing thoughts" for the past few days. Denies any SI, HI, or AVH. She reports having some anxiety. Reports compliancy with her medications. Denies any chest pain or SOB  Review of Systems  Positive:  Negative:   Physical Exam  LMP 05/15/2022 (Approximate)  Gen:   Awake, no distress   Resp:  Normal effort  MSK:   Moves extremities without difficulty  Other:    Medical Decision Making  Medically screening exam initiated at 9:12 PM.  Appropriate orders placed.  Nicole [name] was informed that the remainder of the evaluation will be completed by another provider, this initial triage assessment does not replace that evaluation, and the importance of remaining in the ED until their evaluation is complete.  Med clearance labs   Nicole [name], [place] 07/04/22 2115

## 2022-07-05 NOTE — ED Notes (Addendum)
Pt legal guardian, [name], called to tell her pt is discharged and if she could come get her. Legal guardian stated "no I will not come this time. We have other people to take care of. We're not coming tonight, but maybe in the morning".

## 2022-07-05 NOTE — Discharge Instructions (Addendum)
You were evaluated in the Emergency Department and after careful evaluation, we did not find any emergent condition requiring admission or further testing in the hospital.  Your exam/testing today was overall reassuring.  Your behavioral/mental health issues can be best addressed as an outpatient.  Please return to the Emergency Department if you experience any worsening of your condition.  Thank you for allowing [place] to be a part of your care.

## 2022-07-05 NOTE — ED Notes (Signed)
Pts legal guardian arrived to transport pt home

## 2022-07-05 NOTE — ED Provider Notes (Signed)
[facility] Emergency Department Provider Note MRN:  [id number]  Arrival date & time: 07/05/22     Chief Complaint   Anxiety   History of Present Illness   Nicole [name] is a 31 y.o. year-old female with a history of anxiety, depression, schizophrenia presenting to the ED with chief complaint of anxiety.  Patient here this evening because of racing thoughts keeping her from sleeping.  She wants help getting into a sex addiction Anonymous class.  Cannot sleep this evening.  Denies thoughts of self-harm, does not want to harm others, not having any hallucinations, no pain, no other complaints.  Taking her medicines like normal.  Review of Systems  A thorough review of systems was obtained and all systems are negative except as noted in the HPI and PMH.   Patient's Health History    Past Medical History:  Diagnosis Date   Anxiety    Depression     Past Surgical History:  Procedure Laterality Date   WISDOM TOOTH EXTRACTION Bilateral 2020    Family History  Problem Relation Age of Onset   Hypertension Father    Diabetes Father     Social History   Socioeconomic History   Marital status: Single    Spouse name: Not on file   Number of children: Not on file   Years of education: Not on file   Highest education level: Not on file  Occupational History   Not on file  Tobacco Use   Smoking status: Every Day    Types: Cigarettes   Smokeless tobacco: Never  Vaping Use   Vaping Use: Some days  Substance and Sexual Activity   Alcohol use: Never   Drug use: Never   Sexual activity: Yes    Partners: Female    Birth control/protection: Implant  Other Topics Concern   Not on file  Social History Narrative   Not on file   Social Determinants of Health   Financial Resource Strain: Not on file  Food Insecurity: Not on file  Transportation Needs: Not on file  Physical Activity: Not on file  Stress: Not on file  Social Connections: Not on file   Intimate Partner Violence: Not on file     Physical Exam   Vitals:   07/04/22 2119  BP: 134/74  Pulse: 96  Resp: 18  Temp: 97.9 F (36.6 C)  SpO2: 98%    CONSTITUTIONAL: Well-appearing, NAD NEURO/PSYCH:  Alert and oriented x 3, no focal deficits EYES:  eyes equal and reactive ENT/NECK:  no LAD, no JVD CARDIO: Regular rate, well-perfused, normal S1 and S2 PULM:  CTAB no wheezing or rhonchi GI/GU:  non-distended, non-tender MSK/SPINE:  No gross deformities, no edema SKIN:  no rash, atraumatic   *Additional and/or pertinent findings included in MDM below  Diagnostic and Interventional Summary    EKG Interpretation  Date/Time:    Ventricular Rate:    PR Interval:    QRS Duration:   QT Interval:    QTC Calculation:   R Axis:     Text Interpretation:         Labs Reviewed  COMPREHENSIVE METABOLIC PANEL  ETHANOL  CBC WITH DIFFERENTIAL/PLATELET  RAPID URINE DRUG SCREEN, HOSP PERFORMED  I-STAT BETA HCG BLOOD, ED ([facility], [facility], [facility] ONLY)    No orders to display    Medications - No data to display   Procedures  /  Critical Care Procedures  ED Course and Medical Decision Making  Initial Impression and Ddx  Patient requesting behavioral health evaluation however her needs do not warrant hold in the emergency department.  She is very stable from a mental health perspective, is not a threat of harm to self or others, not hallucinating.  She would be best managed as an outpatient.  Her complaints are fairly chronic based on the most recent note from behavioral health.  Past medical/surgical history that increases complexity of ED encounter: Anxiety, depression, schizophrenia  Interpretation of Diagnostics I personally reviewed the laboratory assessment and my interpretation is as follows: No significant blood count or electrolyte disturbance    Patient Reassessment and Ultimate Disposition/Management     Discharge.  Patient management required discussion with the  following services or consulting groups:  None  Complexity of Problems Addressed Acute complicated illness or Injury  Additional Data Reviewed and Analyzed Further history obtained from: Recent Consult notes  Additional Factors Impacting ED Encounter Risk None  Elmer Sow. Pilar Plate, MD [facility] Emergency Medicine [facility] [email or web address]  Final Clinical Impressions(s) / ED Diagnoses     ICD-10-CM   1. Stress  F43.9     2. Insomnia, unspecified type  G47.00       ED Discharge Orders     None        Discharge Instructions Discussed with and Provided to Patient:    Discharge Instructions      You were evaluated in the Emergency Department and after careful evaluation, we did not find any emergent condition requiring admission or further testing in the hospital.  Your exam/testing today was overall reassuring.  Your behavioral/mental health issues can be best addressed as an outpatient.  Please return to the Emergency Department if you experience any worsening of your condition.  Thank you for allowing [place] to be a part of your care.       Sabas Sous, MD 07/05/22 [phone]

## 2022-07-05 NOTE — ED Notes (Signed)
This RN spoke to pt legal guardian, [name], regarding her eta to pick up pt. Mrs. Thomos Lemons stated she is currently taking care of another client and she would not be able to pick pt up until 1100.

## 2022-07-25 ENCOUNTER — Ambulatory Visit (INDEPENDENT_AMBULATORY_CARE_PROVIDER_SITE_OTHER)
Admission: EM | Admit: 2022-07-25 | Discharge: 2022-10-07 | Disposition: A | Discharge status: Other Acute Inpatient Hospital | Payer: Medicare HMO | Source: Home / Self Care | Attending: Family | Admitting: Family

## 2022-07-25 DIAGNOSIS — Z1152 Encounter for screening for COVID-19: Secondary | ICD-10-CM | POA: Insufficient documentation

## 2022-07-25 DIAGNOSIS — E118 Type 2 diabetes mellitus with unspecified complications: Secondary | ICD-10-CM | POA: Diagnosis not present

## 2022-07-25 DIAGNOSIS — F209 Schizophrenia, unspecified: Secondary | ICD-10-CM | POA: Diagnosis not present

## 2022-07-25 DIAGNOSIS — N390 Urinary tract infection, site not specified: Secondary | ICD-10-CM | POA: Diagnosis not present

## 2022-07-25 DIAGNOSIS — R300 Dysuria: Secondary | ICD-10-CM | POA: Diagnosis present

## 2022-07-25 DIAGNOSIS — F419 Anxiety disorder, unspecified: Secondary | ICD-10-CM | POA: Insufficient documentation

## 2022-07-25 DIAGNOSIS — Z5901 Sheltered homelessness: Secondary | ICD-10-CM | POA: Insufficient documentation

## 2022-07-25 DIAGNOSIS — F333 Major depressive disorder, recurrent, severe with psychotic symptoms: Secondary | ICD-10-CM | POA: Insufficient documentation

## 2022-07-25 DIAGNOSIS — E039 Hypothyroidism, unspecified: Secondary | ICD-10-CM | POA: Insufficient documentation

## 2022-07-25 DIAGNOSIS — Z20822 Contact with and (suspected) exposure to covid-19: Secondary | ICD-10-CM | POA: Insufficient documentation

## 2022-07-25 DIAGNOSIS — F17200 Nicotine dependence, unspecified, uncomplicated: Secondary | ICD-10-CM | POA: Diagnosis not present

## 2022-07-25 DIAGNOSIS — Z79899 Other long term (current) drug therapy: Secondary | ICD-10-CM | POA: Insufficient documentation

## 2022-07-25 DIAGNOSIS — F331 Major depressive disorder, recurrent, moderate: Secondary | ICD-10-CM | POA: Diagnosis present

## 2022-07-25 DIAGNOSIS — R45851 Suicidal ideations: Secondary | ICD-10-CM | POA: Insufficient documentation

## 2022-07-25 DIAGNOSIS — R000 Tachycardia, unspecified: Secondary | ICD-10-CM | POA: Diagnosis not present

## 2022-07-25 DIAGNOSIS — N9489 Other specified conditions associated with female genital organs and menstrual cycle: Secondary | ICD-10-CM | POA: Diagnosis not present

## 2022-07-25 DIAGNOSIS — R4183 Borderline intellectual functioning: Secondary | ICD-10-CM

## 2022-07-25 LAB — CBC WITH DIFFERENTIAL/PLATELET
Abs Immature Granulocytes: 0.02 10*3/uL (ref 0.00–0.07)
Basophils Absolute: 0 10*3/uL (ref 0.0–0.1)
Basophils Relative: 0 %
Eosinophils Absolute: 0 10*3/uL (ref 0.0–0.5)
Eosinophils Relative: 0 %
HCT: 40.2 % (ref 36.0–46.0)
Hemoglobin: 13.7 g/dL (ref 12.0–15.0)
Immature Granulocytes: 0 %
Lymphocytes Relative: 52 %
Lymphs Abs: 4.2 10*3/uL — ABNORMAL HIGH (ref 0.7–4.0)
MCH: 30.9 pg (ref 26.0–34.0)
MCHC: 34.1 g/dL (ref 30.0–36.0)
MCV: 90.5 fL (ref 80.0–100.0)
Monocytes Absolute: 0.4 10*3/uL (ref 0.1–1.0)
Monocytes Relative: 5 %
Neutro Abs: 3.6 10*3/uL (ref 1.7–7.7)
Neutrophils Relative %: 43 %
Platelets: 248 10*3/uL (ref 150–400)
RBC: 4.44 MIL/uL (ref 3.87–5.11)
RDW: 12.2 % (ref 11.5–15.5)
WBC: 8.3 10*3/uL (ref 4.0–10.5)
nRBC: 0 % (ref 0.0–0.2)

## 2022-07-25 LAB — POC SARS CORONAVIRUS 2 AG: SARSCOV2ONAVIRUS 2 AG: NEGATIVE

## 2022-07-25 MED ORDER — SERTRALINE HCL 50 MG PO TABS
150.0000 mg | ORAL_TABLET | Freq: Every day | ORAL | Status: DC
  Administered 2022-07-26 – 2022-10-07 (×74): 150 mg via ORAL
  Filled 2022-07-25 (×75): qty 1

## 2022-07-25 MED ORDER — ACETAMINOPHEN 325 MG PO TABS
650.0000 mg | ORAL_TABLET | Freq: Four times a day (QID) | ORAL | Status: DC | PRN
  Administered 2022-07-26 – 2022-09-21 (×16): 650 mg via ORAL
  Filled 2022-07-25 (×16): qty 2

## 2022-07-25 MED ORDER — ALUM & MAG HYDROXIDE-SIMETH 200-200-20 MG/5ML PO SUSP
30.0000 mL | ORAL | Status: DC | PRN
  Administered 2022-07-27: 30 mL via ORAL
  Filled 2022-07-25: qty 30

## 2022-07-25 MED ORDER — TRAZODONE HCL 50 MG PO TABS
50.0000 mg | ORAL_TABLET | Freq: Every evening | ORAL | Status: DC | PRN
  Administered 2022-07-25 – 2022-09-12 (×49): 50 mg via ORAL
  Filled 2022-07-25 (×50): qty 1

## 2022-07-25 MED ORDER — QUETIAPINE FUMARATE 200 MG PO TABS
400.0000 mg | ORAL_TABLET | Freq: Two times a day (BID) | ORAL | Status: DC
  Administered 2022-07-25 – 2022-10-07 (×148): 400 mg via ORAL
  Filled 2022-07-25 (×148): qty 2

## 2022-07-25 MED ORDER — MAGNESIUM HYDROXIDE 400 MG/5ML PO SUSP
30.0000 mL | Freq: Every day | ORAL | Status: DC | PRN
  Administered 2022-08-02 – 2022-08-31 (×4): 30 mL via ORAL
  Filled 2022-07-25 (×5): qty 30

## 2022-07-25 MED ORDER — NICOTINE 14 MG/24HR TD PT24
14.0000 mg | MEDICATED_PATCH | Freq: Every day | TRANSDERMAL | Status: DC
  Administered 2022-07-26 – 2022-10-04 (×68): 14 mg via TRANSDERMAL
  Filled 2022-07-25 (×74): qty 1

## 2022-07-25 MED ORDER — OXCARBAZEPINE 300 MG PO TABS
300.0000 mg | ORAL_TABLET | Freq: Two times a day (BID) | ORAL | Status: DC
  Administered 2022-07-25 – 2022-10-07 (×148): 300 mg via ORAL
  Filled 2022-07-25 (×148): qty 1

## 2022-07-25 MED ORDER — SERTRALINE HCL 50 MG PO TABS
150.0000 mg | ORAL_TABLET | Freq: Every day | ORAL | Status: DC
  Administered 2022-07-25: 150 mg via ORAL
  Filled 2022-07-25: qty 1

## 2022-07-25 MED ORDER — HYDROXYZINE HCL 25 MG PO TABS
25.0000 mg | ORAL_TABLET | Freq: Three times a day (TID) | ORAL | Status: DC | PRN
  Administered 2022-07-25 – 2022-10-06 (×50): 25 mg via ORAL
  Filled 2022-07-25 (×49): qty 1
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
## 2022-07-25 NOTE — Progress Notes (Signed)
   07/25/22 1958  [place] Triage Screening (Walk-ins at [facility] only)  How Did You Hear About [place]? Legal System  What Is the Reason for Your Visit/Call Today? Daneshia was [facility] by [facility] to [facility] for evaluation of suicidal ideation with a plan to hang herself in the bathroom. Pt reports that she also has homicidal ideation. Pt reports that she is hearing voices that are telling her to do bad things. Pt denies any recent etoh or other substance use. Pt presents as very agitated and is rocking back and forth throughout triage process. Voice volume very soft and pt struggling to articulate to answer questions.  How Long Has This Been Causing You Problems? <Week  Have You Recently Had Any Thoughts About Hurting Yourself? Yes  How long ago did you have thoughts about hurting yourself? today--has plans of hanging herself in bathroom  Are You Planning to [place] At This time? Yes  Have you Recently Had Thoughts About Hurting Someone Gu[place] Dawn? Yes  How long ago did you have thoughts of harming others? today  Are You Planning To Harm Someone At This Time? No  Are you currently experiencing any auditory, visual or other hallucinations? Yes  Please explain the hallucinations you are currently experiencing: pt reports that she is hearing voices commanding her to  Have You Used Any Alcohol or Drugs in the Past 24 Hours? No  Do you have any current medical co-morbidities that require immediate attention? No  Clinician description of patient physical appearance/behavior: Pt presents with flat affect and is highly agitated at time of triage. Pt rocking back and forth.  What Do You Feel Would Help You the Most Today? Treatment for Depression or other mood problem  If access to [facility] was not available, would you have sought care in the Emergency Department? Yes  Determination of Need Emergent (2 hours)  Options For Referral Inpatient Hospitalization;Medication Management;Outpatient Therapy;BH  Urgent Care

## 2022-07-25 NOTE — ED Provider Notes (Addendum)
[facility] Continuous Assessment Admission H&P  Date: 07/25/22 Patient Name: Nicole [name] MRN: [id number] Chief Complaint: "I'm just feeling like hurting myself and the owner of the group home". Chief Complaint  Patient presents with   Suicidal   Homicidal      Diagnoses:  Final diagnoses:  Severe episode of recurrent major depressive disorder, with psychotic features (HCC)    HPI: Nicole [name] 31 y.o.female patient with psychiatric history of schizophrenia unspecified, MDD recurrent severe with psychosis, and SI, who presented to [name] by [facility] voluntarily from her group Home [name]) with complaints of suicidal ideations with a plan to hang herself in the bathroom, passive homicidal ideation, and command auditory hallucinations.  Patient presents as a poor historian and reports "I'm just feeling like hurting myself and the owner of the group home because she does not treat me right, and I have no support system".  Patient reports she has lived at the group home for the past 2 to 3 years.    Patient reports she has a child who is not in her custody.  Patient reports her dad Nicole [name] lives in [place] and he is her legal guardian.  Mr. Nicole [name] is not listed as the legal guardian in the patient's chart.  Patient reports her brother passed away years ago.  Patient reports "I said I wanted to hurt myself because the people at the group home are just terrible and don't care about me, and I'm hearing voices telling me "she don't care about me that I should hang myself".   Patient reports "the owner talked bad about me to the staff, she said that I tell stories, and she is mad at me because I tell the truth that the facility is so bad".   Patient reports "the lady/staff at the group home called the police".  Patient is unable to explain if there was a verbal or physical altercation that led to the police being called.  [name] staff contacted the listed legal guardian on file who  is also the [facility].This individual is uncooperative at time. SW will f/u in am.    Patient endorses suicidal ideation with plan to hang herself in the bathroom. Patient endorsed passive homicidal ideation with no plan. Patient endorsed command auditory hallucinations with voices telling her to hang herself and hurt the owner of the group home.  Pt denies visual hallucinations.  Patient denies access to a gun or other weapon.  Patient reports history of suicidal attempts and states "I don't remember much". Patient denies current self harm behaviors.   Patient reports she has been diagnosed with schizophrenia, anxiety, and bipolar depression.  Patient reports she gets all her psychiatric services and medications at the group home.  Patient is unable to recall the names of her medications at this time.  Patient reports she smokes less than a pack of cigarettes daily.  Patient denies other substance use.  Patient reports her sleep and appetite as fair.  Support, encouragement and reassurance provided about ongoing stressors.  Patient provided with opportunity for questions.  On evaluation, patient is alert, oriented at baseline, and cooperative. Speech is clear, coherent and muffled at times. Pt appears disheveled. Eye contact is poor. Mood is anxious and depressed, affect is congruent with mood. Thought process and thought content is coherent. Pt endorses SI with plan, endorses passive HI with no plan, endorses command auditory hallucinations, denies visual hallucinations. There is no indication that the patient is responding  to internal stimuli. No delusions elicited during this assessment.    PHQ 2-9:  Flowsheet Row ED from 06/07/2022 in [name] Office Visit from 12/17/2021 in [name]  Thoughts that you would be better off dead, or of hurting yourself in some way Not at all Not at all  PHQ-9 Total Score 2 8        Flowsheet Row ED from 07/04/2022 in [name] from 06/14/2022 in [name]  ED from 06/07/2022 in [name]  C-SSRS RISK CATEGORY No Risk No Risk High Risk        Total Time spent with patient: 20 minutes  Musculoskeletal  Strength & Muscle Tone: within normal limits Gait & Station: normal Patient leans: N/A  Psychiatric Specialty Exam  Presentation General Appearance:  Disheveled  Eye Contact: Poor  Speech: Clear and Coherent  Speech Volume: Decreased  Handedness: Right   Mood and Affect  Mood: Anxious; Depressed  Affect: Congruent   Thought Process  Thought Processes: Coherent  Descriptions of Associations:Intact  Orientation:Full (Time, Place and Person)  Thought Content:WDL  Diagnosis of Schizophrenia or Schizoaffective disorder in past: Yes  Duration of Psychotic Symptoms: Greater than six months  Hallucinations:Hallucinations: Auditory Description of Auditory Hallucinations: Pt reports voices " telling me that the group home owner dont care about me and I should hang myself".  Ideas of Reference:None  Suicidal Thoughts:Suicidal Thoughts: Yes, Active SI Active Intent and/or Plan: With Plan  Homicidal Thoughts:Homicidal Thoughts: Yes, Passive HI Passive Intent and/or Plan: With Intent   Sensorium  Memory: Immediate Fair  Judgment: Intact  Insight: Present   Executive Functions  Concentration: Fair  Attention Span: Fair  Recall: [employer] of Knowledge: Fair  Language: Fair   Psychomotor Activity  Psychomotor Activity: Psychomotor Activity: Normal   Assets  Assets: Manufacturing systems engineer; Desire for Improvement; Housing; Resilience   Sleep  Sleep: Sleep: Fair   Nutritional Assessment (For OBS and FBC admissions only) Has the patient had a weight loss or gain of 10 pounds or more in the last 3 months?: No Has  the patient had a decrease in food intake/or appetite?: No Does the patient have dental problems?: No Does the patient have eating habits or behaviors that may be indicators of an eating disorder including binging or inducing vomiting?: No Has the patient recently lost weight without trying?: 0 Has the patient been eating poorly because of a decreased appetite?: 0 Malnutrition Screening Tool Score: 0    Physical Exam Constitutional:      General: She is not in acute distress.    Appearance: She is not diaphoretic.  HENT:     Head: Normocephalic.     Right Ear: External ear normal.     Left Ear: External ear normal.     Nose: No congestion.  Eyes:     General:        Right eye: No discharge.        Left eye: No discharge.  Cardiovascular:     Rate and Rhythm: Normal rate.  Pulmonary:     Effort: No respiratory distress.  Chest:     Chest wall: No tenderness.  Neurological:     Mental Status: She is alert. Mental status is at baseline.  Psychiatric:        Attention and Perception: She perceives auditory hallucinations.        Mood  and Affect: Mood is anxious and depressed. Affect is flat.        Speech: Speech normal.        Behavior: Behavior is cooperative.        Thought Content: Thought content is not paranoid or delusional. Thought content includes homicidal and suicidal ideation. Thought content includes suicidal plan. Thought content does not include homicidal plan.        Cognition and Memory: Memory normal.        Judgment: Judgment is impulsive.    Review of Systems  Constitutional:  Negative for chills, diaphoresis and fever.  HENT:  Negative for congestion.   Eyes:  Negative for discharge.  Respiratory:  Negative for cough, shortness of breath and wheezing.   Cardiovascular:  Negative for chest pain and palpitations.  Gastrointestinal:  Negative for diarrhea, nausea and vomiting.  Neurological:  Negative for seizures, loss of consciousness, weakness and  headaches.  Psychiatric/Behavioral:  Positive for depression, hallucinations and suicidal ideas. Negative for substance abuse. The patient is nervous/anxious.     Blood pressure (!) 129/97, pulse 98, temperature 98.5 F (36.9 C), temperature source Oral, resp. rate 18, SpO2 98 %. There is no height or weight on file to calculate BMI.  Past Psychiatric History: See H & P   Is the patient at risk to self? Yes  Has the patient been a risk to self in the past 6 months? Yes .    Has the patient been a risk to self within the distant past? Yes   Is the patient a risk to others? Yes   Has the patient been a risk to others in the past 6 months? Yes   Has the patient been a risk to others within the distant past? No   Past Medical History:  Past Medical History:  Diagnosis Date   Anxiety    Depression     Past Surgical History:  Procedure Laterality Date   WISDOM TOOTH EXTRACTION Bilateral 2020    Family History:  Family History  Problem Relation Age of Onset   Hypertension Father    Diabetes Father     Social History:  Social History   Socioeconomic History   Marital status: Single    Spouse name: Not on file   Number of children: Not on file   Years of education: Not on file   Highest education level: Not on file  Occupational History   Not on file  Tobacco Use   Smoking status: Every Day    Types: Cigarettes   Smokeless tobacco: Never  Vaping Use   Vaping Use: Some days  Substance and Sexual Activity   Alcohol use: Never   Drug use: Never   Sexual activity: Yes    Partners: Female    Birth control/protection: Implant  Other Topics Concern   Not on file  Social History Narrative   Not on file   Social Determinants of Health   Financial Resource Strain: Not on file  Food Insecurity: Not on file  Transportation Needs: Not on file  Physical Activity: Not on file  Stress: Not on file  Social Connections: Not on file  Intimate Partner Violence: Not on file     SDOH:  SDOH Screenings   Depression (PHQ2-9): Low Risk  (06/10/2022)  Tobacco Use: High Risk (07/04/2022)    Last Labs:  Admission on 07/04/2022, Discharged on 07/05/2022  Component Date Value Ref Range Status   Sodium 07/04/2022 142  135 - 145 mmol/L  Final   Potassium 07/04/2022 4.4  3.5 - 5.1 mmol/L Final   Chloride 07/04/2022 109  98 - 111 mmol/L Final   CO2 07/04/2022 26  22 - 32 mmol/L Final   Glucose, Bld 07/04/2022 96  70 - 99 mg/dL Final   Glucose reference range applies only to samples taken after fasting for at least 8 hours.   BUN 07/04/2022 15  6 - 20 mg/dL Final   Creatinine, Ser 07/04/2022 0.83  0.44 - 1.00 mg/dL Final   Calcium 07/04/2022 9.3  8.9 - 10.3 mg/dL Final   Total Protein 07/04/2022 7.2  6.5 - 8.1 g/dL Final   Albumin 07/04/2022 3.9  3.5 - 5.0 g/dL Final   AST 07/04/2022 23  15 - 41 U/L Final   ALT 07/04/2022 28  0 - 44 U/L Final   Alkaline Phosphatase 07/04/2022 77  38 - 126 U/L Final   Total Bilirubin 07/04/2022 0.4  0.3 - 1.2 mg/dL Final   GFR, Estimated 07/04/2022 >60  >60 mL/min Final   Comment: (NOTE) Calculated using the CKD-EPI Creatinine Equation (2021)    Anion gap 07/04/2022 7  5 - 15 Final   Performed at [facility], [place] [address], [place], [place] [address]   Alcohol, Ethyl (B) 07/04/2022 <10  <10 mg/dL Final   Comment: (NOTE) Lowest detectable limit for serum alcohol is 10 mg/dL.  For medical purposes only. Performed at [facility], [place] [address], [place], [place] [address]    WBC 07/04/2022 7.0  4.0 - 10.5 K/uL Final   RBC 07/04/2022 4.18  3.87 - 5.11 MIL/uL Final   Hemoglobin 07/04/2022 12.8  12.0 - 15.0 g/dL Final   HCT 07/04/2022 39.1  36.0 - 46.0 % Final   MCV 07/04/2022 93.5  80.0 - 100.0 fL Final   MCH 07/04/2022 30.6  26.0 - 34.0 pg Final   MCHC 07/04/2022 32.7  30.0 - 36.0 g/dL Final   RDW 07/04/2022 12.9  11.5 - 15.5 % Final   Platelets 07/04/2022 243  150 - 400 K/uL Final    nRBC 07/04/2022 0.0  0.0 - 0.2 % Final   Neutrophils Relative % 07/04/2022 43  % Final   Neutro Abs 07/04/2022 3.0  1.7 - 7.7 K/uL Final   Lymphocytes Relative 07/04/2022 50  % Final   Lymphs Abs 07/04/2022 3.5  0.7 - 4.0 K/uL Final   Monocytes Relative 07/04/2022 7  % Final   Monocytes Absolute 07/04/2022 0.5  0.1 - 1.0 K/uL Final   Eosinophils Relative 07/04/2022 0  % Final   Eosinophils Absolute 07/04/2022 0.0  0.0 - 0.5 K/uL Final   Basophils Relative 07/04/2022 0  % Final   Basophils Absolute 07/04/2022 0.0  0.0 - 0.1 K/uL Final   Immature Granulocytes 07/04/2022 0  % Final   Abs Immature Granulocytes 07/04/2022 0.01  0.00 - 0.07 K/uL Final   Performed at [facility], [place] [address] [place], [place] [address]   I-stat hCG, quantitative 07/04/2022 <5.0  <5 mIU/mL Final   Comment 3 07/04/2022          Final   Comment:   GEST. AGE      CONC.  (mIU/mL)   <=1 WEEK        5 - 50     2 WEEKS       50 - 500     3 WEEKS       100 - 10,000  4 WEEKS     1,000 - 30,000        FEMALE AND NON-PREGNANT FEMALE:     LESS THAN 5 mIU/mL   Admission on 06/14/2022, Discharged on 06/14/2022  Component Date Value Ref Range Status   Color, UA 06/14/2022 yellow  yellow Final   Clarity, UA 06/14/2022 cloudy (A)  clear Final   Glucose, UA 06/14/2022 negative  negative mg/dL Final   Bilirubin, UA 16/07/9603 negative  negative Final   Ketones, POC UA 06/14/2022 negative  negative mg/dL Final   Spec Grav, UA 54/06/8118 1.025  1.010 - 1.025 Final   Blood, UA 06/14/2022 negative  negative Final   pH, UA 06/14/2022 7.5  5.0 - 8.0 Final   Protein Ur, POC 06/14/2022 =30 (A)  negative mg/dL Final   Urobilinogen, UA 06/14/2022 0.2  0.2 or 1.0 E.U./dL Final   Nitrite, UA 14/78/2956 Negative  Negative Final   Leukocytes, UA 06/14/2022 Negative  Negative Final   Preg Test, Ur 06/14/2022 Negative  Negative Final   Specimen Description 06/14/2022 URINE, CLEAN CATCH   Final   Special  Requests 06/14/2022    Final                   Value:NONE Performed at [facility], [address], [place], [place] [address]    Culture 06/14/2022 MULTIPLE SPECIES PRESENT, SUGGEST RECOLLECTION (A)   Final   Report Status 06/14/2022 06/16/2022 FINAL   Final  Admission on 06/07/2022, Discharged on 06/10/2022  Component Date Value Ref Range Status   SARS Coronavirus 2 by RT PCR 06/07/2022 NEGATIVE  NEGATIVE Final   Comment: (NOTE) SARS-CoV-2 target nucleic acids are NOT DETECTED.  The SARS-CoV-2 RNA is generally detectable in upper respiratory specimens during the acute phase of infection. The lowest concentration of SARS-CoV-2 viral copies this assay can detect is 138 copies/mL. A negative result does not preclude SARS-Cov-2 infection and should not be used as the sole basis for treatment or other patient management decisions. A negative result may occur with  improper specimen collection/handling, submission of specimen other than nasopharyngeal swab, presence of viral mutation(s) within the areas targeted by this assay, and inadequate number of viral copies(<138 copies/mL). A negative result must be combined with clinical observations, patient history, and epidemiological information. The expected result is Negative.  Fact Sheet for Patients:  BloggerCourse.com  Fact Sheet for Healthcare Providers:  SeriousBroker.it  This test is no                          t yet approved or cleared by the [place] FDA and  has been authorized for detection and/or diagnosis of SARS-CoV-2 by FDA under an Emergency Use Authorization (EUA). This EUA will remain  in effect (meaning this test can be used) for the duration of the COVID-19 declaration under Section 564(b)(1) of the Act, 21 U.S.C.section 360bbb-3(b)(1), unless the authorization is terminated  or revoked sooner.       Influenza A by PCR 06/07/2022 NEGATIVE  NEGATIVE  Final   Influenza B by PCR 06/07/2022 NEGATIVE  NEGATIVE Final   Comment: (NOTE) The Xpert Xpress SARS-CoV-2/FLU/RSV plus assay is intended as an aid in the diagnosis of influenza from Nasopharyngeal swab specimens and should not be used as a sole basis for treatment. Nasal washings and aspirates are unacceptable for Xpert Xpress SARS-CoV-2/FLU/RSV testing.  Fact Sheet for Patients: BloggerCourse.com  Fact Sheet for Healthcare Providers: SeriousBroker.it  This  test is not yet approved or cleared by the [place] and has been authorized for detection and/or diagnosis of SARS-CoV-2 by FDA under an Emergency Use Authorization (EUA). This EUA will remain in effect (meaning this test can be used) for the duration of the COVID-19 declaration under Section 564(b)(1) of the Act, 21 U.S.C. section 360bbb-3(b)(1), unless the authorization is terminated or revoked.  Performed at [facility], [address] [place] [address], [place], [place] [address]    WBC 06/07/2022 5.7  4.0 - 10.5 K/uL Final   RBC 06/07/2022 4.39  3.87 - 5.11 MIL/uL Final   Hemoglobin 06/07/2022 13.2  12.0 - 15.0 g/dL Final   HCT 60/45/4098 40.4  36.0 - 46.0 % Final   MCV 06/07/2022 92.0  80.0 - 100.0 fL Final   MCH 06/07/2022 30.1  26.0 - 34.0 pg Final   MCHC 06/07/2022 32.7  30.0 - 36.0 g/dL Final   RDW 11/91/4782 12.4  11.5 - 15.5 % Final   Platelets 06/07/2022 308  150 - 400 K/uL Final   nRBC 06/07/2022 0.0  0.0 - 0.2 % Final   Neutrophils Relative % 06/07/2022 42  % Final   Neutro Abs 06/07/2022 2.4  1.7 - 7.7 K/uL Final   Lymphocytes Relative 06/07/2022 54  % Final   Lymphs Abs 06/07/2022 3.1  0.7 - 4.0 K/uL Final   Monocytes Relative 06/07/2022 4  % Final   Monocytes Absolute 06/07/2022 0.2  0.1 - 1.0 K/uL Final   Eosinophils Relative 06/07/2022 0  % Final   Eosinophils Absolute 06/07/2022 0.0  0.0 - 0.5 K/uL Final   Basophils Relative 06/07/2022 0  % Final    Basophils Absolute 06/07/2022 0.0  0.0 - 0.1 K/uL Final   Immature Granulocytes 06/07/2022 0  % Final   Abs Immature Granulocytes 06/07/2022 0.01  0.00 - 0.07 K/uL Final   Performed at [facility], [address], [place], [place] [address]   Sodium 06/07/2022 139  135 - 145 mmol/L Final   Potassium 06/07/2022 4.0  3.5 - 5.1 mmol/L Final   Chloride 06/07/2022 104  98 - 111 mmol/L Final   CO2 06/07/2022 28  22 - 32 mmol/L Final   Glucose, Bld 06/07/2022 104 (H)  70 - 99 mg/dL Final   Glucose reference range applies only to samples taken after fasting for at least 8 hours.   BUN 06/07/2022 8  6 - 20 mg/dL Final   Creatinine, Ser 06/07/2022 0.84  0.44 - 1.00 mg/dL Final   Calcium 30/86/5784 9.1  8.9 - 10.3 mg/dL Final   Total Protein 69/62/9528 6.9  6.5 - 8.1 g/dL Final   Albumin 41/32/4401 3.7  3.5 - 5.0 g/dL Final   AST 02/72/5366 19  15 - 41 U/L Final   ALT 06/07/2022 22  0 - 44 U/L Final   Alkaline Phosphatase 06/07/2022 54  38 - 126 U/L Final   Total Bilirubin 06/07/2022 0.4  0.3 - 1.2 mg/dL Final   GFR, Estimated 06/07/2022 >60  >60 mL/min Final   Comment: (NOTE) Calculated using the CKD-EPI Creatinine Equation (2021)    Anion gap 06/07/2022 7  5 - 15 Final   Performed at [facility], [address] [place], [place] [address]   Hgb A1c MFr Bld 06/07/2022 5.0  4.8 - 5.6 % Final   Comment: (NOTE) Pre diabetes:          5.7%-6.4%  Diabetes:              >  6.4%  Glycemic control for   <7.0% adults with diabetes    Mean Plasma Glucose 06/07/2022 96.8  mg/dL Final   Performed at [facility], [address], [place], [place] [address]   TSH 06/07/2022 1.620  0.350 - 4.500 uIU/mL Final   Comment: Performed by a 3rd Generation assay with a functional sensitivity of <=0.01 uIU/mL. Performed at [facility], [address] [place], [place] [address]    RPR Ser Ql 06/07/2022 NON REACTIVE  NON REACTIVE Final   Performed at [facility], [address] [place], [place] [address]   Color, Urine 06/07/2022 YELLOW  YELLOW Final   APPearance 06/07/2022 HAZY (A)  CLEAR Final   Specific Gravity, Urine 06/07/2022 1.018  1.005 - 1.030 Final   pH 06/07/2022 7.0  5.0 - 8.0 Final   Glucose, UA 06/07/2022 NEGATIVE  NEGATIVE mg/dL Final   Hgb urine dipstick 06/07/2022 NEGATIVE  NEGATIVE Final   Bilirubin Urine 06/07/2022 NEGATIVE  NEGATIVE Final   Ketones, ur 06/07/2022 NEGATIVE  NEGATIVE mg/dL Final   Protein, ur 91/47/8295 NEGATIVE  NEGATIVE mg/dL Final   Nitrite 62/13/0865 NEGATIVE  NEGATIVE Final   Leukocytes,Ua 06/07/2022 NEGATIVE  NEGATIVE Final   Performed at [facility], [address] [place], [place] [address]   Cholesterol 06/07/2022 164  0 - 200 mg/dL Final   Triglycerides 62/95/2841 158 (H)  <150 mg/dL Final   HDL 32/44/0102 44  >40 mg/dL Final   Total CHOL/HDL Ratio 06/07/2022 3.7  RATIO Final   VLDL 06/07/2022 32  0 - 40 mg/dL Final   LDL Cholesterol 06/07/2022 88  0 - 99 mg/dL Final   Comment:        Total Cholesterol/HDL:CHD Risk Coronary Heart Disease Risk Table                     Men   Women  1/2 Average Risk   3.4   3.3  Average Risk       5.0   4.4  2 X Average Risk   9.6   7.1  3 X Average Risk  23.4   11.0        Use the calculated Patient Ratio above and the CHD Risk Table to determine the patient's CHD Risk.        ATP III CLASSIFICATION (LDL):  <100     mg/dL   Optimal  725-366  mg/dL   Near or Above                    Optimal  130-159  mg/dL   Borderline  440-347  mg/dL   High  >425     mg/dL   Very High Performed at [facility], [address], [place], [place] [address]    HIV Screen 4th Generation wRfx 06/07/2022 Non Reactive  Non Reactive Final   Performed at [facility], [address] [place], [place] [address]   SARSCOV2ONAVIRUS 2 AG 06/07/2022 NEGATIVE  NEGATIVE Final   Comment: (NOTE) SARS-CoV-2 antigen NOT DETECTED.   Negative results are presumptive.  Negative  results do not preclude SARS-CoV-2 infection and should not be used as the sole basis for treatment or other patient management decisions, including infection  control decisions, particularly in the presence of clinical signs and  symptoms consistent with COVID-19, or in those who have been in contact with the virus.  Negative results must be  combined with clinical observations, patient history, and epidemiological information. The expected result is Negative.  Fact Sheet for Patients: https://www.jennings-kim.com/  Fact Sheet for Healthcare Providers: https://alexander-rogers.biz/  This test is not yet approved or cleared by the [place] FDA and  has been authorized for detection and/or diagnosis of SARS-CoV-2 by FDA under an Emergency Use Authorization (EUA).  This EUA will remain in effect (meaning this test can be used) for the duration of  the COV                          ID-19 declaration under Section 564(b)(1) of the Act, 21 U.S.C. section 360bbb-3(b)(1), unless the authorization is terminated or revoked sooner.     POC Amphetamine UR 06/07/2022 None Detected  NONE DETECTED (Cut Off Level 1000 ng/mL) Final   POC Secobarbital (BAR) 06/07/2022 None Detected  NONE DETECTED (Cut Off Level 300 ng/mL) Final   POC Buprenorphine (BUP) 06/07/2022 None Detected  NONE DETECTED (Cut Off Level 10 ng/mL) Final   POC Oxazepam (BZO) 06/07/2022 None Detected  NONE DETECTED (Cut Off Level 300 ng/mL) Final   POC Cocaine UR 06/07/2022 None Detected  NONE DETECTED (Cut Off Level 300 ng/mL) Final   POC Methamphetamine UR 06/07/2022 None Detected  NONE DETECTED (Cut Off Level 1000 ng/mL) Final   POC Morphine 06/07/2022 None Detected  NONE DETECTED (Cut Off Level 300 ng/mL) Final   POC Methadone UR 06/07/2022 None Detected  NONE DETECTED (Cut Off Level 300 ng/mL) Final   POC Oxycodone UR 06/07/2022 None Detected  NONE DETECTED (Cut Off Level 100 ng/mL) Final   POC  Marijuana UR 06/07/2022 None Detected  NONE DETECTED (Cut Off Level 50 ng/mL) Final   Preg Test, Ur 06/08/2022 NEGATIVE  NEGATIVE Final   Comment:        THE SENSITIVITY OF THIS METHODOLOGY IS >20 mIU/mL. Performed at [facility], [address], [place], [place] [address]    Preg Test, Ur 06/08/2022 NEGATIVE  NEGATIVE Final   Comment:        THE SENSITIVITY OF THIS METHODOLOGY IS >24 mIU/mL   Admission on 02/07/2022, Discharged on 02/08/2022  Component Date Value Ref Range Status   SARS Coronavirus 2 by RT PCR 02/07/2022 NEGATIVE  NEGATIVE Final   Comment: (NOTE) SARS-CoV-2 target nucleic acids are NOT DETECTED.  The SARS-CoV-2 RNA is generally detectable in upper respiratory specimens during the acute phase of infection. The lowest concentration of SARS-CoV-2 viral copies this assay can detect is 138 copies/mL. A negative result does not preclude SARS-Cov-2 infection and should not be used as the sole basis for treatment or other patient management decisions. A negative result may occur with  improper specimen collection/handling, submission of specimen other than nasopharyngeal swab, presence of viral mutation(s) within the areas targeted by this assay, and inadequate number of viral copies(<138 copies/mL). A negative result must be combined with clinical observations, patient history, and epidemiological information. The expected result is Negative.  Fact Sheet for Patients:  BloggerCourse.com  Fact Sheet for Healthcare Providers:  SeriousBroker.it  This test is no                          t yet approved or cleared by the [place] FDA and  has been authorized for detection and/or diagnosis of SARS-CoV-2 by FDA under an Emergency Use Authorization (EUA). This EUA will remain  in effect (meaning this test can  be used) for the duration of the COVID-19 declaration under Section 564(b)(1) of the Act,  21 U.S.C.section 360bbb-3(b)(1), unless the authorization is terminated  or revoked sooner.       Influenza A by PCR 02/07/2022 NEGATIVE  NEGATIVE Final   Influenza B by PCR 02/07/2022 NEGATIVE  NEGATIVE Final   Comment: (NOTE) The Xpert Xpress SARS-CoV-2/FLU/RSV plus assay is intended as an aid in the diagnosis of influenza from Nasopharyngeal swab specimens and should not be used as a sole basis for treatment. Nasal washings and aspirates are unacceptable for Xpert Xpress SARS-CoV-2/FLU/RSV testing.  Fact Sheet for Patients: BloggerCourse.com  Fact Sheet for Healthcare Providers: SeriousBroker.it  This test is not yet approved or cleared by the [place] FDA and has been authorized for detection and/or diagnosis of SARS-CoV-2 by FDA under an Emergency Use Authorization (EUA). This EUA will remain in effect (meaning this test can be used) for the duration of the COVID-19 declaration under Section 564(b)(1) of the Act, 21 U.S.C. section 360bbb-3(b)(1), unless the authorization is terminated or revoked.  Performed at [facility], [address] [place], [place] [address]    WBC 02/07/2022 8.1  4.0 - 10.5 K/uL Final   RBC 02/07/2022 4.55  3.87 - 5.11 MIL/uL Final   Hemoglobin 02/07/2022 13.6  12.0 - 15.0 g/dL Final   HCT 91/47/8295 41.9  36.0 - 46.0 % Final   MCV 02/07/2022 92.1  80.0 - 100.0 fL Final   MCH 02/07/2022 29.9  26.0 - 34.0 pg Final   MCHC 02/07/2022 32.5  30.0 - 36.0 g/dL Final   RDW 62/13/0865 12.9  11.5 - 15.5 % Final   Platelets 02/07/2022 286  150 - 400 K/uL Final   nRBC 02/07/2022 0.0  0.0 - 0.2 % Final   Neutrophils Relative % 02/07/2022 41  % Final   Neutro Abs 02/07/2022 3.3  1.7 - 7.7 K/uL Final   Lymphocytes Relative 02/07/2022 53  % Final   Lymphs Abs 02/07/2022 4.3 (H)  0.7 - 4.0 K/uL Final   Monocytes Relative 02/07/2022 6  % Final   Monocytes Absolute 02/07/2022 0.5  0.1 - 1.0 K/uL  Final   Eosinophils Relative 02/07/2022 0  % Final   Eosinophils Absolute 02/07/2022 0.0  0.0 - 0.5 K/uL Final   Basophils Relative 02/07/2022 0  % Final   Basophils Absolute 02/07/2022 0.0  0.0 - 0.1 K/uL Final   Immature Granulocytes 02/07/2022 0  % Final   Abs Immature Granulocytes 02/07/2022 0.02  0.00 - 0.07 K/uL Final   Performed at [facility], [address] [place], [place] [address]   Sodium 02/07/2022 137  135 - 145 mmol/L Final   Potassium 02/07/2022 4.1  3.5 - 5.1 mmol/L Final   Chloride 02/07/2022 104  98 - 111 mmol/L Final   CO2 02/07/2022 29  22 - 32 mmol/L Final   Glucose, Bld 02/07/2022 92  70 - 99 mg/dL Final   Glucose reference range applies only to samples taken after fasting for at least 8 hours.   BUN 02/07/2022 10  6 - 20 mg/dL Final   Creatinine, Ser 02/07/2022 0.88  0.44 - 1.00 mg/dL Final   Calcium 62/95/2841 9.4  8.9 - 10.3 mg/dL Final   Total Protein 32/44/0102 7.2  6.5 - 8.1 g/dL Final   Albumin 72/53/6644 3.9  3.5 - 5.0 g/dL Final   AST 03/47/4259 18  15 - 41 U/L Final   ALT 02/07/2022 25  0 -  44 U/L Final   Alkaline Phosphatase 02/07/2022 66  38 - 126 U/L Final   Total Bilirubin 02/07/2022 0.2 (L)  0.3 - 1.2 mg/dL Final   GFR, Estimated 02/07/2022 >60  >60 mL/min Final   Comment: (NOTE) Calculated using the CKD-EPI Creatinine Equation (2021)    Anion gap 02/07/2022 4 (L)  5 - 15 Final   Performed at [facility], [address] [place], [place] [address]   Hgb A1c MFr Bld 02/07/2022 5.0  4.8 - 5.6 % Final   Comment: (NOTE) Pre diabetes:          5.7%-6.4%  Diabetes:              >6.4%  Glycemic control for   <7.0% adults with diabetes    Mean Plasma Glucose 02/07/2022 96.8  mg/dL Final   Performed at [facility], [address] [place], [place] [address]   Alcohol, Ethyl (B) 02/07/2022 <10  <10 mg/dL Final   Comment: (NOTE) Lowest detectable limit for serum alcohol is 10 mg/dL.  For medical purposes only. Performed at  [facility], [address] [place], [place] [address]    Cholesterol 02/07/2022 192  0 - 200 mg/dL Final   Triglycerides 91/47/829504/27/2023 192 (H)  <150 mg/dL Final   HDL 62/13/086504/27/2023 46  >40 mg/dL Final   Total CHOL/HDL Ratio 02/07/2022 4.2  RATIO Final   VLDL 02/07/2022 38  0 - 40 mg/dL Final   LDL Cholesterol 02/07/2022 108 (H)  0 - 99 mg/dL Final   Comment:        Total Cholesterol/HDL:CHD Risk Coronary Heart Disease Risk Table                     Men   Women  1/2 Average Risk   3.4   3.3  Average Risk       5.0   4.4  2 X Average Risk   9.6   7.1  3 X Average Risk  23.4   11.0        Use the calculated Patient Ratio above and the CHD Risk Table to determine the patient's CHD Risk.        ATP III CLASSIFICATION (LDL):  <100     mg/dL   Optimal  [id number]  mg/dL   Near or Above                    Optimal  130-159  mg/dL   Borderline  [id number]  mg/dL   High  >132>190     mg/dL   Very High Performed at [facility], [address] [place], [place] [address]    TSH 02/07/2022 6.344 (H)  0.350 - 4.500 uIU/mL Final   Comment: Performed by a 3rd Generation assay with a functional sensitivity of <=0.01 uIU/mL. Performed at [facility], [address] [place], [place] [address]    POC Amphetamine UR 02/07/2022 None Detected  NONE DETECTED (Cut Off Level 1000 ng/mL) Final   POC Secobarbital (BAR) 02/07/2022 None Detected  NONE DETECTED (Cut Off Level 300 ng/mL) Final   POC Buprenorphine (BUP) 02/07/2022 None Detected  NONE DETECTED (Cut Off Level 10 ng/mL) Final   POC Oxazepam (BZO) 02/07/2022 None Detected  NONE DETECTED (Cut Off Level 300 ng/mL) Final   POC Cocaine UR 02/07/2022 None Detected  NONE DETECTED (Cut Off Level 300 ng/mL) Final   POC Methamphetamine UR 02/07/2022 None  Detected  NONE DETECTED (Cut Off Level 1000 ng/mL) Final   POC Morphine 02/07/2022 None Detected  NONE DETECTED (Cut Off Level 300 ng/mL) Final   POC Oxycodone UR 02/07/2022 None Detected  NONE  DETECTED (Cut Off Level 100 ng/mL) Final   POC Methadone UR 02/07/2022 None Detected  NONE DETECTED (Cut Off Level 300 ng/mL) Final   POC Marijuana UR 02/07/2022 None Detected  NONE DETECTED (Cut Off Level 50 ng/mL) Final   SARSCOV2ONAVIRUS 2 AG 02/07/2022 NEGATIVE  NEGATIVE Final   Comment: (NOTE) SARS-CoV-2 antigen NOT DETECTED.   Negative results are presumptive.  Negative results do not preclude SARS-CoV-2 infection and should not be used as the sole basis for treatment or other patient management decisions, including infection  control decisions, particularly in the presence of clinical signs and  symptoms consistent with COVID-19, or in those who have been in contact with the virus.  Negative results must be combined with clinical observations, patient history, and epidemiological information. The expected result is Negative.  Fact Sheet for Patients: https://www.jennings-kim.com/  Fact Sheet for Healthcare Providers: https://alexander-rogers.biz/  This test is not yet approved or cleared by the [place] FDA and  has been authorized for detection and/or diagnosis of SARS-CoV-2 by FDA under an Emergency Use Authorization (EUA).  This EUA will remain in effect (meaning this test can be used) for the duration of  the COV                          ID-19 declaration under Section 564(b)(1) of the Act, 21 U.S.C. section 360bbb-3(b)(1), unless the authorization is terminated or revoked sooner.     Preg Test, Ur 02/07/2022 NEGATIVE  NEGATIVE Final   Comment:        THE SENSITIVITY OF THIS METHODOLOGY IS >24 mIU/mL     Allergies: Apple juice, Other, and Watermelon flavor  PTA Medications: (Not in a hospital admission)  Prior to Admission medications   Medication Sig Start Date End Date Taking? Authorizing Provider  ABILIFY MAINTENA 400 MG PRSY prefilled syringe 400 mg every 28 (twenty-eight) days. 08/10/21   [provider]  cetirizine  (ZYRTEC) 10 MG tablet Take 10 mg by mouth daily.    [provider]  cyclobenzaprine (FLEXERIL) 10 MG tablet Take 1 tablet (10 mg total) by mouth 2 (two) times daily as needed for muscle spasms. 06/14/22   Tomi Bamberger, PA-C  fluticasone (FLONASE) 50 MCG/ACT nasal spray Place 1 spray into both nostrils daily. 08/22/21   [provider]  hydrOXYzine (ATARAX/VISTARIL) 50 MG tablet Take 50 mg by mouth 2 (two) times daily. 07/28/21   [provider]  ibuprofen (ADVIL) 800 MG tablet Take 800 mg by mouth 2 (two) times daily as needed (For pain).    [provider]  meloxicam (MOBIC) 15 MG tablet Take 15 mg by mouth daily. 08/22/21   [provider]  Oxcarbazepine (TRILEPTAL) 300 MG tablet Take 300 mg by mouth 2 (two) times daily. 08/22/21   [provider]  QUEtiapine (SEROQUEL) 400 MG tablet Take 400 mg by mouth 2 (two) times daily. 08/22/21   [provider]  sertraline (ZOLOFT) 50 MG tablet Take 150 mg by mouth daily. 08/22/21   [provider]  traZODone (DESYREL) 100 MG tablet Take 100 mg by mouth at bedtime. 08/22/21   [provider]  valACYclovir (VALTREX) 500 MG tablet Take 500 mg by mouth daily. 08/11/21   [provider]  Medical Decision Making  Recommend inpatient psychiatric admission for mood stabilization, safety monitoring, medication management.  Patient admitted to the continuous observation unit, pending availability of appropriate bed space at the [facility] tonight.  If there are no beds spaces available at [facility], patient is to be faxed out.   Patient has a legal guardian "[name]" on file. [name] staff contacted listed legal guardian for collateral.  Legal guardian is uncooperative at this time.  [name] will follow up.  However, the patient named Nicole [name] (her dad) residing in [place] as her legal guardian.  This individual is not listed on file.  Lab Orders         Resp Panel by RT-PCR  (Flu A&B, Covid) Anterior Nasal Swab         CBC with Differential/Platelet         Comprehensive metabolic panel         Lipid panel         TSH         Pregnancy, urine         POCT Urine Drug Screen - (I-Screen)     EKG  Home medications restarted this encounter -Trileptal 300 mg p.o. twice daily mood stabilization -Seroquel 400 mg p.o. twice daily mood stabilization -Zoloft 150 mg p.o. daily  -NicoDerm CQ 24 hour 14mg  transdermal patch.  Other PRNs -Tylenol 650 mg p.o. every 6 hours as needed pain -Maalox 30 mm p.o. every 4 hours as needed indigestion -Atarax 25 mg p.o. 3 times daily as needed anxiety -MOM 30 mL p.o. daily as needed constipation -Trazodone 50 mg p.o. nightly as needed sleep  Recommendations  Based on my evaluation the patient does not appear to have an emergency medical condition.  Recommend inpatient psychiatric admission for mood stabilization, safety monitoring and medication management.  Mancel Bale, NP 07/25/22  8:58 PM

## 2022-07-25 NOTE — ED Notes (Signed)
Pt sleeping@this time. breathing even and unlabored. Will continue to monitor for safety 

## 2022-07-25 NOTE — ED Notes (Addendum)
Pt admitted to obs for SI with a plan to hang herself in the bathroom, passive HI, and command AH. Pt denies VH. Pt A&O x4, calm and cooperative. Pt tolerated lab work and skin assessment well. Pt ambulated independently to unit. Oriented to unit/staff. MHT provided meal. No signs of distress noted. Monitoring for safety.

## 2022-07-26 DIAGNOSIS — F333 Major depressive disorder, recurrent, severe with psychotic symptoms: Secondary | ICD-10-CM | POA: Diagnosis not present

## 2022-07-26 LAB — POCT URINE DRUG SCREEN - MANUAL ENTRY (I-SCREEN)
POC Amphetamine UR: NOT DETECTED
POC Buprenorphine (BUP): NOT DETECTED
POC Cocaine UR: NOT DETECTED
POC Marijuana UR: NOT DETECTED — NL
POC Methadone UR: NOT DETECTED
POC Methamphetamine UR: NOT DETECTED
POC Morphine: NOT DETECTED
POC Oxazepam (BZO): NOT DETECTED
POC Oxycodone UR: NOT DETECTED
POC Secobarbital (BAR): NOT DETECTED

## 2022-07-26 LAB — COMPREHENSIVE METABOLIC PANEL
ALT: 22 U/L (ref 0–44)
AST: 18 U/L (ref 15–41)
Albumin: 3.8 g/dL (ref 3.5–5.0)
Alkaline Phosphatase: 64 U/L (ref 38–126)
Anion gap: 5 (ref 5–15)
BUN: 11 mg/dL (ref 6–20)
CO2: 29 mmol/L (ref 22–32)
Calcium: 9.2 mg/dL (ref 8.9–10.3)
Chloride: 104 mmol/L (ref 98–111)
Creatinine, Ser: 0.97 mg/dL (ref 0.44–1.00)
GFR, Estimated: >60 mL/min (ref >60)
Glucose, Bld: 83 mg/dL (ref 70–99)
Potassium: 4 mmol/L (ref 3.5–5.1)
Sodium: 138 mmol/L (ref 135–145)
Total Bilirubin: 0.2 mg/dL — ABNORMAL LOW (ref 0.3–1.2)
Total Protein: 7 g/dL (ref 6.5–8.1)

## 2022-07-26 LAB — TSH: TSH: 6.668 u[IU]/mL — ABNORMAL HIGH (ref 0.350–4.500)

## 2022-07-26 LAB — LIPID PANEL
Cholesterol: 181 mg/dL (ref 0–200)
HDL: 45 mg/dL
LDL Cholesterol: 112 mg/dL — ABNORMAL HIGH (ref 0–99)
Total CHOL/HDL Ratio: 4 ratio
Triglycerides: 118 mg/dL
VLDL: 24 mg/dL (ref 0–40)

## 2022-07-26 LAB — RESP PANEL BY RT-PCR (FLU A&B, COVID) ARPGX2
Influenza A by PCR: NEGATIVE
Influenza B by PCR: NEGATIVE
SARS Coronavirus 2 by RT PCR: NEGATIVE

## 2022-07-26 LAB — GLUCOSE, CAPILLARY: Glucose-Capillary: 104 mg/dL — ABNORMAL HIGH (ref 70–99)

## 2022-07-26 MED ORDER — HYDROXYZINE HCL 50 MG PO TABS: 50.0000 mg | ORAL_TABLET | Freq: Two times a day (BID) | ORAL | 0 refills | Status: AC

## 2022-07-26 MED ORDER — TRAZODONE HCL 100 MG PO TABS: 100.0000 mg | ORAL_TABLET | Freq: Every day | ORAL | 0 refills | Status: DC

## 2022-07-26 MED ORDER — OXCARBAZEPINE 300 MG PO TABS: 300.0000 mg | ORAL_TABLET | Freq: Two times a day (BID) | ORAL | 0 refills | Status: DC

## 2022-07-26 MED ORDER — QUETIAPINE FUMARATE 400 MG PO TABS: 400.0000 mg | ORAL_TABLET | Freq: Two times a day (BID) | ORAL | 0 refills | Status: DC

## 2022-07-26 MED ORDER — SERTRALINE HCL 50 MG PO TABS: 150.0000 mg | ORAL_TABLET | Freq: Every morning | ORAL | 0 refills | Status: DC

## 2022-07-26 NOTE — ED Provider Notes (Cosign Needed Addendum)
Patient gave verbal permission to call [name] and dad for collateral and updates.  Was not able to get in contact with patient's group home manager [name]), with phone number [phone].  Did leave a HIPAA compliant voicemail.  Will attempt again.  Ms. [name] returned the voicemail later the same day. Stated that patient is not able to come back to the group home due to homicidal and suicidal threats that scare [name] other patients. [name] stated that her biological mother is the patient's legal guardian. [name] provided her father's number to try to get in contact with mother.   Able to get in contact with patient's [name]), who also confirmed that mom is patient's legal guardian, however mom has written a note to the group home to contact dad for patient's care and that dad has consent to make decisions for patient. Dad stated that he is supportive, but unable to pick patient up. Stated that he will call [name], about her staying there.   Signed: Merrily Brittle, DO Psychiatry Resident, PGY-2 [facility] 07/26/2022, 10:51 AM

## 2022-07-26 NOTE — ED Notes (Signed)
Pt sitting on her bed eating a bag of chips she is calm and cooperative. No c/o pain or distress. Will continue to monitor for safety

## 2022-07-26 NOTE — Progress Notes (Signed)
[facility] called a variety of places but they report no current openings or they were closed and will reopen on Monday.  Two agency report [facility] locates groups homes for indivudals and they would be the best contact.  [facility] #: [id number].  Writer left a detailed message requesting a returned phone call. Writer provider the front desk number.    Writer also emailed a list of group homes in the state of [place] to [employer].  [name] reports he will also assist with locating a group home for his daughter.    Writer never received a e-mail back from [place] worker Ms. Williams. She informed the provider earlier today she would fax a copy of the guardianship letter over to the office or walk it over to our office since she's working the weekend.  Writer does not have a phone number for the APS Worker.   Writer never received any fax.    Drema Balzarine  [facility]

## 2022-07-26 NOTE — Progress Notes (Signed)
[facility] called the client's father, Marcy Salvo.  He reports he sent the APS SWK Ms. Jimmye Norman a copy of the guardianship paperwork this evening but she never responded back.  [facility] informed Mr. Pearla Dubonnet will need a ROI, the guardianship document, and consent to work with APS & [facility].   [name] reports he's been in communication with [facility] but they report it could take up to thirty days to have her placed there.  [facility] will email [name] some additional places he can call tomorrow to check for openings and etc.    Zollie Scale [facility]

## 2022-07-26 NOTE — Plan of Care (Signed)
Nicole [name] is a 31 y.o. female, with PMH of SCZ unspecified, MDD with psychosis, who presented voluntary to [facility] (07/25/2022) via [facility] after a verbal altercation with Nicole [name] (not patient's legal guardian) for transient SI.  This is patient's fourth visit to [facility] for similar concerns this year.   Legal Guardian: Mom (Nicole [name])  Working with APS to obtain documentation of guardianship for patient's chart, however APS confirmed that patient's mother is legal guardian.  Discussed with patient's dad Nicole [name]), that he needs to be written consent from mom saying that she gives permission and consent for dad to make decisions on patient's behalf in regards to assistance with disposition.   Also will need mom to sign a release of information form, if group home needs psychiatric evaluation and medication list.  Will also need enervation waiver for placement in a group home.  Patient is unable to sign release of information form.  Working with APS to determine if patient has a Therapist, music to assist with disposition.  Currently father is trying to [facility]), as patient is currently unable to return to current group home.   Signed: Merrily Brittle, DO Psychiatry Resident, PGY-2 [facility] 07/26/2022, 4:32 PM

## 2022-07-26 NOTE — Discharge Instructions (Addendum)
Dear Nicole [name],  Most effective treatment for your mental health disease involves BOTH a psychiatrist AND a therapist Psychiatrist to manage medications Therapist to help identify personal goals, barriers from those goals, and plan to achieve those goals by understanding emotions Please make regular appointments with an outpatient psychiatrist and other doctors once you leave the hospital (if any, otherwise, please see below for resources to make an appointment).  For therapy outside the hospital, please ask for these specific types of therapy: DBT ________________________________________________________  SAFETY CRISIS  Dial 988 for [place]    Text [phone] for Crisis Text Line:     [facility]:  [address] FIRST FLOOR.  [place], [place] [address].  [phone]  Mobile Crisis Response Teams Listed by counties in vicinity of [place]. [phone] [place] [phone] [place] [phone] [facility] [phone] [place] [phone] [place]. [phone] [place].  [place] [phone] ________________________________________________________  To see which pharmacy near you is the CHEAPEST for certain medications, please use GoodRx. It is free website and has a free phone app.    Also consider looking at [facility] $4.00 or [facility]'s $7.00 prescription list. Both are free to view if googled "[facility] $4 prescription" and "public's $7 prescription". These are set prices, no insurance required. [facility]'s low cost medications: $4-$15 for 30days  prescriptions or $10-$38 for 90days prescriptions  ________________________________________________________  Difficulties with sleep?   Can also use this free app for insomnia called CBT-I. Let your doctors and therapists know so they can help with extra tips and tricks or for guidance and accountability. NO ADDS on the app.     ________________________________________________________  Non-Emergent / Urgent  [facility] [address], [place], [place] [address] [phone] OUTPATIENT Walk-in information: Please note, all walk-ins are first come & first serve, with limited number of availability.  Please note that to be eligible for services you must bring: ID or a piece of mail with your name [facility]) address  Therapist for therapy:  Monday & Wednesdays: Please ARRIVE at 7:15 AM for registration Will START at 8:00 AM Every 1st & 2nd Friday of the month: Please ARRIVE at 10:15 AM for registration Will START at 1 PM - 5 PM  Psychiatrist for medication management: Monday - Friday:  Please ARRIVE at 7:15 AM for registration Will START at 8:00 AM  Regretfully, due to limited availability, please be aware that you may not been seen on the same day as walk-in. Please consider making an appoint or try again. Thank you for your patience and understanding.   TRANSPORTATION To get a ride to your medical appointment, call your your Non-Emergency Medical Transportation [facility]) provider. Your NEMT provider is based on the health plan you are enrolled in. When your appointment is made, please call your NEMT provider the same day, to schedule your ride.  [place] Medicaid Direct and EBCI Ecologist of Social Services (DSS) to schedule a ride. DSS Transportation [address] [place] [address] [phone]  Others #s: [phone] Office Hours 8:00 am  to 5:00 pm Monday -  Friday     [employer] [phone]  [facility] Complete [phone]  [facility] [place] [phone]  [facility] Plan [phone]  [facility] [phone]   [employer] - Another resource for a transport system that serves several counties. StrategyVenture.se

## 2022-07-26 NOTE — ED Notes (Signed)
Pt asleep in bed. Respirations even and unlabored. Monitoring for safety. 

## 2022-07-26 NOTE — Progress Notes (Signed)
[facility]  emailed SWK Williams about requesting guardianship paperwork.  Providers reports they will need a ROI, release to coordinate with [facility], guardianship documentation, and consent to work with APS.    Nicole [name] [facility]

## 2022-07-26 NOTE — ED Provider Notes (Deleted)
FBC/OBS ASAP Discharge Summary  Date and Time: 07/26/2022, 9:02 AM  Name: Nicole [name]  Age: 31 y.o.  DOB: August 05, 1991  MRN:  [id number]   Discharge Diagnoses:  Final diagnoses:  Severe episode of recurrent major depressive disorder, with psychotic features ([place])    HPI:  Per H&P: " HPI: Nicole [name] 31 y.o.female patient with psychiatric history of schizophrenia unspecified, MDD recurrent severe with psychosis, and SI, who presented to [facility] by [facility] voluntarily from her group Home [facility]) with complaints of suicidal ideations with a plan to hang herself in the bathroom, passive homicidal ideation, and command auditory hallucinations.   Patient presents as a poor historian and reports "I'm just feeling like hurting myself and the owner of the group home because she does not treat me right, and I have no support system".  Patient reports she has lived at the group home for the past 2 to 3 years.     Patient reports she has a child who is not in her custody.  Patient reports her dad Nicole [name] lives in [place] and he is her legal guardian.  Nicole [name] is not listed as the legal guardian in the patient's chart.  Patient reports her brother passed away years ago.   Patient reports "I said I wanted to hurt myself because the people at the group home are just terrible and don't care about me, and I'm hearing voices telling me "she don't care about me that I should hang myself".    Patient reports "the owner talked bad about me to the staff, she said that I tell stories, and she is mad at me because I tell the truth that the facility is so bad".   Patient reports "the lady/staff at the group home called the police".  Patient is unable to explain if there was a verbal or physical altercation that led to the police being called.   [name] staff contacted the listed legal guardian on file who is also the owner [facility].This individual is uncooperative at time. SW will f/u in  am.     Patient endorses suicidal ideation with plan to hang herself in the bathroom. Patient endorsed passive homicidal ideation with no plan. Patient endorsed command auditory hallucinations with voices telling her to hang herself and hurt the owner of the group home.  Pt denies visual hallucinations.  Patient denies access to a gun or other weapon.  Patient reports history of suicidal attempts and states "I don't remember much". Patient denies current self harm behaviors.    Patient reports she has been diagnosed with schizophrenia, anxiety, and bipolar depression.  Patient reports she gets all her psychiatric services and medications at the group home.  Patient is unable to recall the names of her medications at this time.   Patient reports she smokes less than a pack of cigarettes daily.  Patient denies other substance use.  Patient reports her sleep and appetite as fair.   Support, encouragement and reassurance provided about ongoing stressors.  Patient provided with opportunity for questions.   On evaluation, patient is alert, oriented at baseline, and cooperative. Speech is clear, coherent and muffled at times. Pt appears disheveled. Eye contact is poor. Mood is anxious and depressed, affect is congruent with mood. Thought process and thought content is coherent. Pt endorses SI with plan, endorses passive HI with no plan, endorses command auditory hallucinations, denies visual hallucinations. There is no indication that the patient is responding to internal stimuli. No  delusions elicited during this assessment.   "  Past psychiatric history: Suicide attempt with inpatient psychiatric admission ~2021 via OD. Diagnoses: MDD with psychosis, schizophrenia unspecified  Subjective:  Patient was initially seen asleep in bed, comfortably, no acute distress.  She awoken easily.  Stated that her sleep and appetite were stable.  Reported having on and off SI for a while, and that Nicole [name] (started  legal guardian) has been a recurrent trigger for her SI.  However stated she does not want to die.  Denied SI today.  She reported having "HI", however with further explanation, stated that she just wants to hit [place].  Stated that she does not want to kill [place].  Discussed safety planning with her, stated that she does not have any weapons or guns.  Discussed calling 911 or [phone] if she were to have thoughts of SI/HI/AVH/paranoia.  Also she can call the crisis mobile number, or return to [facility].  Patient stated she understood and amenable to going home.   Today patient denied SI/HI/AVH and paranoia and delusions. She contracted to safety.  WV:[id number] Thoughts: No [id number] Thoughts: No (thoughts of wanting to hit group home manager, not kill) [id number]: None Description of Auditory Hallucinations: Denied AH Ideas of LFY:BOFB   Mood:  ("tired") Sleep:Fair Appetite: Fair Review of Systems  Respiratory:  Negative for shortness of breath.   Cardiovascular:  Negative for chest pain.  Gastrointestinal:  Negative for nausea and vomiting.  Neurological:  Negative for dizziness and headaches.    Stay Summary:  Nicole [name] is a 31 y.o. female with PMH of SCZ unspecified, MDD with psychosis, who presented voluntary to [facility] (07/25/2022) via [facility] after a verbal altercation with Nicole [name] (chart documented legal guardian) for transient SI.  This is patient's fourth visit to [facility] for similar concerns this year. During her encounter, patient was pleasant, calm, cooperative.  No acute behavioral concerns.  No agitation PRN's required.  Patient has a legal guardian "Nicole [name]" on file. [name] staff contacted listed legal guardian for collateral.  Legal guardian is uncooperative at this time.  [name] will follow up.  However, the patient named Nicole [name] (her dad) residing in [place] as her legal guardian.  This individual is not listed on file.   Lab Orders         Resp Panel  by RT-PCR (Flu A&B, Covid) Anterior Nasal Swab         CBC with Differential/Platelet         Comprehensive metabolic panel         Lipid panel         TSH         Pregnancy, urine         POCT Urine Drug Screen - (I-Screen)     EKG   Home medications restarted this encounter -Trileptal 300 mg p.o. twice daily mood stabilization -Seroquel 400 mg p.o. twice daily mood stabilization -Zoloft 150 mg p.o. daily   -NicoDerm CQ 24 hour 14mg  transdermal patch.   Other PRNs -Tylenol 650 mg p.o. every 6 hours as needed pain -Maalox 30 mm p.o. every 4 hours as needed indigestion -Atarax 25 mg p.o. 3 times daily as needed anxiety -MOM 30 mL p.o. daily as needed constipation -Trazodone 50 mg p.o. nightly as needed sleep     Past Psychiatric History: Per H&P Past Medical History:  Past Medical History:  Diagnosis Date   Anxiety    Depression     Past Surgical History:  Procedure  Laterality Date   WISDOM TOOTH EXTRACTION Bilateral 2020   Family History:  Family History  Problem Relation Age of Onset   Hypertension Father    Diabetes Father    Family Psychiatric History: Per H&P Social History:  Social History   Substance and Sexual Activity  Alcohol Use Never     Social History   Substance and Sexual Activity  Drug Use Never    Social History   Socioeconomic History   Marital status: Single    Spouse name: Not on file   Number of children: Not on file   Years of education: Not on file   Highest education level: Not on file  Occupational History   Not on file  Tobacco Use   Smoking status: Every Day    Types: Cigarettes   Smokeless tobacco: Never  Vaping Use   Vaping Use: Some days  Substance and Sexual Activity   Alcohol use: Never   Drug use: Never   Sexual activity: Yes    Partners: Female    Birth control/protection: Implant  Other Topics Concern   Not on file  Social History Narrative   Not on file   Social Determinants of Health   Financial  Resource Strain: Not on file  Food Insecurity: Not on file  Transportation Needs: Not on file  Physical Activity: Not on file  Stress: Not on file  Social Connections: Not on file   SDOH:  SDOH Screenings   Depression (PHQ2-9): Low Risk  (06/10/2022)  Tobacco Use: High Risk (07/04/2022)   Tobacco Cessation:  A prescription for an FDA-approved tobacco cessation medication was offered at discharge and the patient refused  Current Medications:  Current Facility-Administered Medications  Medication Dose Route Frequency Provider Last Rate Last Admin   acetaminophen (TYLENOL) tablet 650 mg  650 mg Oral Q6H PRN Onuoha, Chinwendu V, NP       alum & mag hydroxide-simeth (MAALOX/MYLANTA) 200-200-20 MG/5ML suspension 30 mL  30 mL Oral Q4H PRN Onuoha, Chinwendu V, NP       hydrOXYzine (ATARAX) tablet 25 mg  25 mg Oral TID PRN Onuoha, Chinwendu V, NP   25 mg at 07/25/22 2135   magnesium hydroxide (MILK OF MAGNESIA) suspension 30 mL  30 mL Oral Daily PRN Onuoha, Chinwendu V, NP       nicotine (NICODERM CQ - dosed in mg/24 hours) patch 14 mg  14 mg Transdermal Q0600 Onuoha, Chinwendu V, NP   14 mg at 07/26/22 0544   Oxcarbazepine (TRILEPTAL) tablet 300 mg  300 mg Oral BID Onuoha, Chinwendu V, NP   300 mg at 07/25/22 2135   QUEtiapine (SEROQUEL) tablet 400 mg  400 mg Oral BID Onuoha, Chinwendu V, NP   400 mg at 07/25/22 2135   sertraline (ZOLOFT) tablet 150 mg  150 mg Oral Daily Onuoha, Chinwendu V, NP       traZODone (DESYREL) tablet 50 mg  50 mg Oral QHS PRN Onuoha, Chinwendu V, NP   50 mg at 07/25/22 2135   Current Outpatient Medications  Medication Sig Dispense Refill   ABILIFY MAINTENA 400 MG PRSY prefilled syringe 400 mg every 28 (twenty-eight) days.     cetirizine (ZYRTEC) 10 MG tablet Take 10 mg by mouth daily.     cyclobenzaprine (FLEXERIL) 10 MG tablet Take 1 tablet (10 mg total) by mouth 2 (two) times daily as needed for muscle spasms. 20 tablet 0   fluticasone (FLONASE) 50 MCG/ACT nasal  spray Place 1 spray into both  nostrils daily.     hydrOXYzine (ATARAX) 50 MG tablet Take 1 tablet (50 mg total) by mouth 2 (two) times daily. 60 tablet 0   Oxcarbazepine (TRILEPTAL) 300 MG tablet Take 1 tablet (300 mg total) by mouth 2 (two) times daily. 60 tablet 0   QUEtiapine (SEROQUEL) 400 MG tablet Take 1 tablet (400 mg total) by mouth 2 (two) times daily. 60 tablet 0   sertraline (ZOLOFT) 50 MG tablet Take 3 tablets (150 mg total) by mouth in the morning. 90 tablet 0   traZODone (DESYREL) 100 MG tablet Take 1 tablet (100 mg total) by mouth at bedtime. 30 tablet 0    PTA Medications: (Not in a hospital admission)     06/10/2022    9:53 AM 12/17/2021   11:03 AM  Depression screen PHQ 2/9  Decreased Interest 0 1  Down, Depressed, Hopeless 1 1  PHQ - 2 Score 1 2  Altered sleeping 0 0  Tired, decreased energy 0 3  Change in appetite 0 0  Feeling bad or failure about yourself  0 0  Trouble concentrating 1 3  Moving slowly or fidgety/restless 0 0  Suicidal thoughts 0 0  PHQ-9 Score 2 8  Difficult doing work/chores Not difficult at all     [place] [facility] from 07/25/2022 in [facility] from 07/04/2022 in [place] DEPT ED from 06/14/2022 in [facility] High Risk No Risk No Risk       Musculoskeletal  Strength & Muscle Tone: within normal limits Gait & Station: normal Patient leans: N/A   Psychiatric Specialty Exam   Presentation  General Appearance:Disheveled Eye Contact:Good Speech:Clear and Coherent, Normal Rate Volume:Decreased Handedness:Right  Mood and Affect  Mood: ("tired") Affect:Appropriate, Congruent, Constricted  Thought Process  Thought Process:Coherent, Goal Directed Descriptions of Associations:Circumstantial  Thought Content Suicidal Thoughts:Suicidal Thoughts: No SI Active Intent and/or Plan: Without Intent Homicidal  Thoughts:Homicidal Thoughts: No (thoughts of wanting to hit group home manager, not kill) Hallucinations:Hallucinations: None Description of Auditory Hallucinations: Denied AH Ideas of Reference:None Thought Content:Logical, WDL  Sensorium  Memory:Immediate Fair Judgment:Fair Insight:Fair  Executive Functions  Orientation:Full (Time, Place and Person) Language:Good Concentration:Good [place]  Psychomotor Activity  Psychomotor Activity:Psychomotor Activity: Normal  Assets  Assets:Communication Skills, Desire for Improvement, Housing, Leisure Time  Sleep  Quality:Fair  Physical Exam  BP 117/83 (BP Location: Right Arm)   Pulse 96   Temp 98.4 F (36.9 C) (Oral)   Resp 18   SpO2 98%   Physical Exam Vitals and nursing note reviewed.  Constitutional:      General: She is awake. She is not in acute distress.    Appearance: She is not ill-appearing or diaphoretic.  HENT:     Head: Normocephalic.  Pulmonary:     Effort: Pulmonary effort is normal. No respiratory distress.  Neurological:     Mental Status: She is alert.     Demographic Factors:  Adolescent or young adult, Low socioeconomic status, and Unemployed  Loss Factors: NA  Historical Factors: Impulsivity  Risk Reduction Factors:   Living with another person, especially a relative and Positive therapeutic relationship  Continued Clinical Symptoms:  Previous Psychiatric Diagnoses and Treatments  Cognitive Features That Contribute To Risk:  Loss of executive function    Suicide Risk:  Mild:  Suicidal ideation of limited frequency, intensity, duration, and specificity.  There are no identifiable plans, no  associated intent, mild dysphoria and related symptoms, good self-control (both objective and subjective assessment), few other risk factors, and identifiable protective factors, including available and accessible social support.  Plan Of Care/Follow-up  recommendations:  Activity and diet at tolerated.  Please: Take all medications as prescribed by your mental healthcare provider. Report any adverse effects and or reactions from the medicines to your outpatient provider promptly. Do not engage in alcohol and or illegal drug use while on prescription medicines.  Disposition: home to group home at [facility] via taxi voucher   Total Time spent with patient: 20 minutes  Signed: Merrily Brittle, DO Psychiatry Resident, PGY-2 [facility] 07/26/2022, 9:02 AM

## 2022-07-26 NOTE — ED Notes (Signed)
Pt sleeping@this  time. Breathing even and unlabored. Will continue top monitor for safety

## 2022-07-26 NOTE — ED Provider Notes (Signed)
[facility]  Behavioral Health Progress Note  Date and Time: 07/26/2022 1:14 PM Name: Nicole [name] Age: 31 y.o.  DOB: 11-Apr-1991  MRN:  [id number]  Diagnosis:  Final diagnoses:  Severe episode of recurrent major depressive disorder, with psychotic features (HCC)    Brief HPI  Nicole [name] is a 31 y.o. female, with PMH of SCZ unspecified, MDD with psychosis, who presented voluntary to [facility] (07/25/2022) via [facility] after a verbal altercation with [name] (not patient's legal guardian) for transient SI.  This is patient's fourth visit to [facility] for similar concerns this year.  Legal Guardian: Mom  Interval Hx   Patient was initially seen asleep in bed, comfortably, no acute distress.  She awoken easily.  Stated that her sleep and appetite were stable.  Reported having on and off SI for a while, and that Nicole [name] (started legal guardian) has been a recurrent trigger for her SI.  However stated she does not want to die.  Denied SI today.  She reported having "HI", however with further explanation, stated that she just wants to hit [place].  Stated that she does not want to kill [place].  Discussed safety planning with her, stated that she does not have any weapons or guns.  Discussed calling 911 or [phone] if she were to have thoughts of SI/HI/AVH/paranoia.  Also she can call the crisis mobile number, or return to [facility].  Patient stated she understood and amenable to going home.    Today patient denied SI/HI/AVH and paranoia and delusions. She contracted to safety.  ROS   WU:[id number] Thoughts: No WG:[id number] Thoughts: No (thoughts of wanting to hit group home manager, not kill) VHQ:[id number]: None Description of Auditory Hallucinations: Denied AH Ideas of MWN:UUVO    Mood:  ("tired") Sleep:Fair Appetite: Fair Review of Systems  Respiratory:  Negative for shortness of breath.   Cardiovascular:  Negative for chest pain.  Gastrointestinal:   Negative for nausea and vomiting.  Neurological:  Negative for dizziness and headaches.   Past History   Past Psychiatric History: See H&P Past Medical History:  Past Medical History:  Diagnosis Date   Anxiety    Depression     Past Surgical History:  Procedure Laterality Date   WISDOM TOOTH EXTRACTION Bilateral 2020   Family History:  Family History  Problem Relation Age of Onset   Hypertension Father    Diabetes Father    Family Psychiatric  History: See H&P  Social History:  Social History   Substance and Sexual Activity  Alcohol Use Never     Social History   Substance and Sexual Activity  Drug Use Never    Social History   Socioeconomic History   Marital status: Single    Spouse name: Not on file   Number of children: Not on file   Years of education: Not on file   Highest education level: Not on file  Occupational History   Not on file  Tobacco Use   Smoking status: Every Day    Types: Cigarettes   Smokeless tobacco: Never  Vaping Use   Vaping Use: Some days  Substance and Sexual Activity   Alcohol use: Never   Drug use: Never   Sexual activity: Yes    Partners: Female    Birth control/protection: Implant  Other Topics Concern   Not on file  Social History Narrative   Not on file   Social Determinants of Health   Financial Resource Strain: Not on file  Food Insecurity: Not on file  Transportation Needs: Not on file  Physical Activity: Not on file  Stress: Not on file  Social Connections: Not on file   SDOH:  SDOH Screenings   Depression (PHQ2-9): Low Risk  (06/10/2022)  Tobacco Use: High Risk (07/04/2022)   Additional Social History:    Pain Medications: SEE MAR Prescriptions: SEE MAR Over the Counter: SEE MAR History of alcohol / drug use?: Yes Longest period of sobriety (when/how long): N/A Negative Consequences of Use:  (NONE) Withdrawal Symptoms: None Name of Substance 1: ETOH IN PAST--PT CURRENTLY DENIES 1 - Frequency:  SOCIAL USE ETOH IN PAST 1 - Method of Aquiring: LEGAL 1- Route of Use: ORAL DRINK Name of Substance 2: NICOTINE-VAPE AND CIGARETTES 2 - Amount (size/oz): LESS THAN ONE PACK 2 - Frequency: DAILY 2 - Method of Aquiring: LEGAL 2 - Route of Substance Use: ORAL SMOKE                Current Medications:  Current Facility-Administered Medications  Medication Dose Route Frequency Provider Last Rate Last Admin   acetaminophen (TYLENOL) tablet 650 mg  650 mg Oral Q6H PRN Onuoha, Chinwendu V, NP       alum & mag hydroxide-simeth (MAALOX/MYLANTA) 200-200-20 MG/5ML suspension 30 mL  30 mL Oral Q4H PRN Onuoha, Chinwendu V, NP       hydrOXYzine (ATARAX) tablet 25 mg  25 mg Oral TID PRN Onuoha, Chinwendu V, NP   25 mg at 07/25/22 2135   magnesium hydroxide (MILK OF MAGNESIA) suspension 30 mL  30 mL Oral Daily PRN Onuoha, Chinwendu V, NP       nicotine (NICODERM CQ - dosed in mg/24 hours) patch 14 mg  14 mg Transdermal Q0600 Onuoha, Chinwendu V, NP   14 mg at 07/26/22 0544   Oxcarbazepine (TRILEPTAL) tablet 300 mg  300 mg Oral BID Onuoha, Chinwendu V, NP   300 mg at 07/26/22 1042   QUEtiapine (SEROQUEL) tablet 400 mg  400 mg Oral BID Onuoha, Chinwendu V, NP   400 mg at 07/26/22 1042   sertraline (ZOLOFT) tablet 150 mg  150 mg Oral Daily Onuoha, Chinwendu V, NP   150 mg at 07/26/22 1042   traZODone (DESYREL) tablet 50 mg  50 mg Oral QHS PRN Onuoha, Chinwendu V, NP   50 mg at 07/25/22 2135   Current Outpatient Medications  Medication Sig Dispense Refill   ABILIFY MAINTENA 400 MG PRSY prefilled syringe 400 mg every 28 (twenty-eight) days.     cetirizine (ZYRTEC) 10 MG tablet Take 10 mg by mouth daily.     cyclobenzaprine (FLEXERIL) 10 MG tablet Take 1 tablet (10 mg total) by mouth 2 (two) times daily as needed for muscle spasms. 20 tablet 0   fluticasone (FLONASE) 50 MCG/ACT nasal spray Place 1 spray into both nostrils daily.     hydrOXYzine (ATARAX) 50 MG tablet Take 1 tablet (50 mg total) by mouth 2  (two) times daily. 60 tablet 0   meloxicam (MOBIC) 15 MG tablet Take 15 mg by mouth daily.     Oxcarbazepine (TRILEPTAL) 300 MG tablet Take 1 tablet (300 mg total) by mouth 2 (two) times daily. 60 tablet 0   QUEtiapine (SEROQUEL) 400 MG tablet Take 1 tablet (400 mg total) by mouth 2 (two) times daily. 60 tablet 0   sertraline (ZOLOFT) 50 MG tablet Take 3 tablets (150 mg total) by mouth in the morning. 90 tablet 0   traZODone (DESYREL) 100 MG tablet Take 1  tablet (100 mg total) by mouth at bedtime. 30 tablet 0   valACYclovir (VALTREX) 500 MG tablet Take 500 mg by mouth daily.     Labs  Lab Results:  Admission on 07/25/2022  Component Date Value Ref Range Status   SARS Coronavirus 2 by RT PCR 07/25/2022 NEGATIVE  NEGATIVE Final   Comment: (NOTE) SARS-CoV-2 target nucleic acids are NOT DETECTED.  The SARS-CoV-2 RNA is generally detectable in upper respiratory specimens during the acute phase of infection. The lowest concentration of SARS-CoV-2 viral copies this assay can detect is 138 copies/mL. A negative result does not preclude SARS-Cov-2 infection and should not be used as the sole basis for treatment or other patient management decisions. A negative result may occur with  improper specimen collection/handling, submission of specimen other than nasopharyngeal swab, presence of viral mutation(s) within the areas targeted by this assay, and inadequate number of viral copies(<138 copies/mL). A negative result must be combined with clinical observations, patient history, and epidemiological information. The expected result is Negative.  Fact Sheet for Patients:  BloggerCourse.com  Fact Sheet for Healthcare Providers:  SeriousBroker.it  This test is no                          t yet approved or cleared by the [place] FDA and  has been authorized for detection and/or diagnosis of SARS-CoV-2 by FDA under an Emergency Use  Authorization (EUA). This EUA will remain  in effect (meaning this test can be used) for the duration of the COVID-19 declaration under Section 564(b)(1) of the Act, 21 U.S.C.section 360bbb-3(b)(1), unless the authorization is terminated  or revoked sooner.       Influenza A by PCR 07/25/2022 NEGATIVE  NEGATIVE Final   Influenza B by PCR 07/25/2022 NEGATIVE  NEGATIVE Final   Comment: (NOTE) The Xpert Xpress SARS-CoV-2/FLU/RSV plus assay is intended as an aid in the diagnosis of influenza from Nasopharyngeal swab specimens and should not be used as a sole basis for treatment. Nasal washings and aspirates are unacceptable for Xpert Xpress SARS-CoV-2/FLU/RSV testing.  Fact Sheet for Patients: BloggerCourse.com  Fact Sheet for Healthcare Providers: SeriousBroker.it  This test is not yet approved or cleared by the [place] FDA and has been authorized for detection and/or diagnosis of SARS-CoV-2 by FDA under an Emergency Use Authorization (EUA). This EUA will remain in effect (meaning this test can be used) for the duration of the COVID-19 declaration under Section 564(b)(1) of the Act, 21 U.S.C. section 360bbb-3(b)(1), unless the authorization is terminated or revoked.  Performed at [facility], [address], [place], [place] [address]    WBC 07/25/2022 8.3  4.0 - 10.5 K/uL Final   RBC 07/25/2022 4.44  3.87 - 5.11 MIL/uL Final   Hemoglobin 07/25/2022 13.7  12.0 - 15.0 g/dL Final   HCT 60/45/4098 40.2  36.0 - 46.0 % Final   MCV 07/25/2022 90.5  80.0 - 100.0 fL Final   MCH 07/25/2022 30.9  26.0 - 34.0 pg Final   MCHC 07/25/2022 34.1  30.0 - 36.0 g/dL Final   RDW 11/91/4782 12.2  11.5 - 15.5 % Final   Platelets 07/25/2022 248  150 - 400 K/uL Final   nRBC 07/25/2022 0.0  0.0 - 0.2 % Final   Neutrophils Relative % 07/25/2022 43  % Final   Neutro Abs 07/25/2022 3.6  1.7 - 7.7 K/uL Final   Lymphocytes Relative 07/25/2022  52  % Final  Lymphs Abs 07/25/2022 4.2 (H)  0.7 - 4.0 K/uL Final   Monocytes Relative 07/25/2022 5  % Final   Monocytes Absolute 07/25/2022 0.4  0.1 - 1.0 K/uL Final   Eosinophils Relative 07/25/2022 0  % Final   Eosinophils Absolute 07/25/2022 0.0  0.0 - 0.5 K/uL Final   Basophils Relative 07/25/2022 0  % Final   Basophils Absolute 07/25/2022 0.0  0.0 - 0.1 K/uL Final   Immature Granulocytes 07/25/2022 0  % Final   Abs Immature Granulocytes 07/25/2022 0.02  0.00 - 0.07 K/uL Final   Performed at [facility], [address] [place], [place] [address]   Sodium 07/25/2022 138  135 - 145 mmol/L Final   Potassium 07/25/2022 4.0  3.5 - 5.1 mmol/L Final   Chloride 07/25/2022 104  98 - 111 mmol/L Final   CO2 07/25/2022 29  22 - 32 mmol/L Final   Glucose, Bld 07/25/2022 83  70 - 99 mg/dL Final   Glucose reference range applies only to samples taken after fasting for at least 8 hours.   BUN 07/25/2022 11  6 - 20 mg/dL Final   Creatinine, Ser 07/25/2022 0.97  0.44 - 1.00 mg/dL Final   Calcium 60/45/4098 9.2  8.9 - 10.3 mg/dL Final   Total Protein 11/91/4782 7.0  6.5 - 8.1 g/dL Final   Albumin 95/62/1308 3.8  3.5 - 5.0 g/dL Final   AST 65/78/4696 18  15 - 41 U/L Final   ALT 07/25/2022 22  0 - 44 U/L Final   Alkaline Phosphatase 07/25/2022 64  38 - 126 U/L Final   Total Bilirubin 07/25/2022 0.2 (L)  0.3 - 1.2 mg/dL Final   GFR, Estimated 07/25/2022 >60  >60 mL/min Final   Comment: (NOTE) Calculated using the CKD-EPI Creatinine Equation (2021)    Anion gap 07/25/2022 5  5 - 15 Final   Performed at [facility], [address] [place], [place] [address]   POC Amphetamine UR 07/25/2022 None Detected  NONE DETECTED (Cut Off Level 1000 ng/mL) Preliminary   POC Secobarbital (BAR) 07/25/2022 None Detected  NONE DETECTED (Cut Off Level 300 ng/mL) Preliminary   POC Buprenorphine (BUP) 07/25/2022 None Detected  NONE DETECTED (Cut Off Level 10 ng/mL) Preliminary   POC Oxazepam (BZO)  07/25/2022 None Detected  NONE DETECTED (Cut Off Level 300 ng/mL) Preliminary   POC Cocaine UR 07/25/2022 None Detected  NONE DETECTED (Cut Off Level 300 ng/mL) Preliminary   POC Methamphetamine UR 07/25/2022 None Detected  NONE DETECTED (Cut Off Level 1000 ng/mL) Preliminary   POC Morphine 07/25/2022 None Detected  NONE DETECTED (Cut Off Level 300 ng/mL) Preliminary   POC Methadone UR 07/25/2022 None Detected  NONE DETECTED (Cut Off Level 300 ng/mL) Preliminary   POC Oxycodone UR 07/25/2022 None Detected  NONE DETECTED (Cut Off Level 100 ng/mL) Preliminary   POC Marijuana UR 07/25/2022 None Detected  NONE DETECTED (Cut Off Level 50 ng/mL) Preliminary   SARSCOV2ONAVIRUS 2 AG 07/25/2022 NEGATIVE  NEGATIVE Final   Comment: (NOTE) SARS-CoV-2 antigen NOT DETECTED.   Negative results are presumptive.  Negative results do not preclude SARS-CoV-2 infection and should not be used as the sole basis for treatment or other patient management decisions, including infection  control decisions, particularly in the presence of clinical signs and  symptoms consistent with COVID-19, or in those who have been in contact with the virus.  Negative results must be combined with clinical observations, patient history, and epidemiological information. The expected result  is Negative.  Fact Sheet for Patients: https://www.jennings-kim.com/  Fact Sheet for Healthcare Providers: https://alexander-rogers.biz/  This test is not yet approved or cleared by the [place] FDA and  has been authorized for detection and/or diagnosis of SARS-CoV-2 by FDA under an Emergency Use Authorization (EUA).  This EUA will remain in effect (meaning this test can be used) for the duration of  the COV                          ID-19 declaration under Section 564(b)(1) of the Act, 21 U.S.C. section 360bbb-3(b)(1), unless the authorization is terminated or revoked sooner.     Cholesterol 07/25/2022 181   0 - 200 mg/dL Final   Triglycerides 93/23/5573 118  <150 mg/dL Final   HDL 22/11/5425 45  >40 mg/dL Final   Total CHOL/HDL Ratio 07/25/2022 4.0  RATIO Final   VLDL 07/25/2022 24  0 - 40 mg/dL Final   LDL Cholesterol 07/25/2022 112 (H)  0 - 99 mg/dL Final   Comment:        Total Cholesterol/HDL:CHD Risk Coronary Heart Disease Risk Table                     Men   Women  1/2 Average Risk   3.4   3.3  Average Risk       5.0   4.4  2 X Average Risk   9.6   7.1  3 X Average Risk  23.4   11.0        Use the calculated Patient Ratio above and the CHD Risk Table to determine the patient's CHD Risk.        ATP III CLASSIFICATION (LDL):  <100     mg/dL   Optimal  062-376  mg/dL   Near or Above                    Optimal  130-159  mg/dL   Borderline  283-151  mg/dL   High  >761     mg/dL   Very High Performed at [facility], [address], [place], [place] [address]    TSH 07/25/2022 6.668 (H)  0.350 - 4.500 uIU/mL Final   Comment: Performed by a 3rd Generation assay with a functional sensitivity of <=0.01 uIU/mL. Performed at [facility], [address] [place], [place] [address]   Admission on 07/04/2022, Discharged on 07/05/2022  Component Date Value Ref Range Status   Sodium 07/04/2022 142  135 - 145 mmol/L Final   Potassium 07/04/2022 4.4  3.5 - 5.1 mmol/L Final   Chloride 07/04/2022 109  98 - 111 mmol/L Final   CO2 07/04/2022 26  22 - 32 mmol/L Final   Glucose, Bld 07/04/2022 96  70 - 99 mg/dL Final   Glucose reference range applies only to samples taken after fasting for at least 8 hours.   BUN 07/04/2022 15  6 - 20 mg/dL Final   Creatinine, Ser 07/04/2022 0.83  0.44 - 1.00 mg/dL Final   Calcium 94/85/4627 9.3  8.9 - 10.3 mg/dL Final   Total Protein 03/50/0938 7.2  6.5 - 8.1 g/dL Final   Albumin 18/29/9371 3.9  3.5 - 5.0 g/dL Final   AST 69/67/8938 23  15 - 41 U/L Final   ALT 07/04/2022 28  0 - 44 U/L Final   Alkaline Phosphatase 07/04/2022 77  38 - 126 U/L  Final  Total Bilirubin 07/04/2022 0.4  0.3 - 1.2 mg/dL Final   GFR, Estimated 07/04/2022 >60  >60 mL/min Final   Comment: (NOTE) Calculated using the CKD-EPI Creatinine Equation (2021)    Anion gap 07/04/2022 7  5 - 15 Final   Performed at [facility], [address] [place], [place] [address]   Alcohol, Ethyl (B) 07/04/2022 <10  <10 mg/dL Final   Comment: (NOTE) Lowest detectable limit for serum alcohol is 10 mg/dL.  For medical purposes only. Performed at [name], [address] [place], [place] [address]    WBC 07/04/2022 7.0  4.0 - 10.5 K/uL Final   RBC 07/04/2022 4.18  3.87 - 5.11 MIL/uL Final   Hemoglobin 07/04/2022 12.8  12.0 - 15.0 g/dL Final   HCT 09/81/1914 39.1  36.0 - 46.0 % Final   MCV 07/04/2022 93.5  80.0 - 100.0 fL Final   MCH 07/04/2022 30.6  26.0 - 34.0 pg Final   MCHC 07/04/2022 32.7  30.0 - 36.0 g/dL Final   RDW 78/29/5621 12.9  11.5 - 15.5 % Final   Platelets 07/04/2022 243  150 - 400 K/uL Final   nRBC 07/04/2022 0.0  0.0 - 0.2 % Final   Neutrophils Relative % 07/04/2022 43  % Final   Neutro Abs 07/04/2022 3.0  1.7 - 7.7 K/uL Final   Lymphocytes Relative 07/04/2022 50  % Final   Lymphs Abs 07/04/2022 3.5  0.7 - 4.0 K/uL Final   Monocytes Relative 07/04/2022 7  % Final   Monocytes Absolute 07/04/2022 0.5  0.1 - 1.0 K/uL Final   Eosinophils Relative 07/04/2022 0  % Final   Eosinophils Absolute 07/04/2022 0.0  0.0 - 0.5 K/uL Final   Basophils Relative 07/04/2022 0  % Final   Basophils Absolute 07/04/2022 0.0  0.0 - 0.1 K/uL Final   Immature Granulocytes 07/04/2022 0  % Final   Abs Immature Granulocytes 07/04/2022 0.01  0.00 - 0.07 K/uL Final   Performed at [facility], [address], [place], [place] [address]   I-stat hCG, quantitative 07/04/2022 <5.0  <5 mIU/mL Final   Comment 3 07/04/2022          Final   Comment:   GEST. AGE      CONC.  (mIU/mL)   <=1 WEEK        5 - 50     2 WEEKS        50 - 500     3 WEEKS       100 - 10,000     4 WEEKS     1,000 - 30,000        FEMALE AND NON-PREGNANT FEMALE:     LESS THAN 5 mIU/mL   Admission on 06/14/2022, Discharged on 06/14/2022  Component Date Value Ref Range Status   Color, UA 06/14/2022 yellow  yellow Final   Clarity, UA 06/14/2022 cloudy (A)  clear Final   Glucose, UA 06/14/2022 negative  negative mg/dL Final   Bilirubin, UA 78/46/9629 negative  negative Final   Ketones, POC UA 06/14/2022 negative  negative mg/dL Final   Spec Grav, UA 52/84/1324 1.025  1.010 - 1.025 Final   Blood, UA 06/14/2022 negative  negative Final   pH, UA 06/14/2022 7.5  5.0 - 8.0 Final   Protein Ur, POC 06/14/2022 =30 (A)  negative mg/dL Final   Urobilinogen, UA 06/14/2022 0.2  0.2 or 1.0 E.U./dL Final   Nitrite, UA 40/07/2724 Negative  Negative  Final   Leukocytes, UA 06/14/2022 Negative  Negative Final   Preg Test, Ur 06/14/2022 Negative  Negative Final   Specimen Description 06/14/2022 URINE, CLEAN CATCH   Final   Special Requests 06/14/2022    Final                   Value:NONE Performed at [facility], [place] [address], [place], [place] [address]    Culture 06/14/2022 MULTIPLE SPECIES PRESENT, SUGGEST RECOLLECTION (A)   Final   Report Status 06/14/2022 06/16/2022 FINAL   Final  Admission on 06/07/2022, Discharged on 06/10/2022  Component Date Value Ref Range Status   SARS Coronavirus 2 by RT PCR 06/07/2022 NEGATIVE  NEGATIVE Final   Comment: (NOTE) SARS-CoV-2 target nucleic acids are NOT DETECTED.  The SARS-CoV-2 RNA is generally detectable in upper respiratory specimens during the acute phase of infection. The lowest concentration of SARS-CoV-2 viral copies this assay can detect is 138 copies/mL. A negative result does not preclude SARS-Cov-2 infection and should not be used as the sole basis for treatment or other patient management decisions. A negative result may occur with  improper specimen collection/handling, submission of  specimen other than nasopharyngeal swab, presence of viral mutation(s) within the areas targeted by this assay, and inadequate number of viral copies(<138 copies/mL). A negative result must be combined with clinical observations, patient history, and epidemiological information. The expected result is Negative.  Fact Sheet for Patients:  EntrepreneurPulse.com.au  Fact Sheet for Healthcare Providers:  IncredibleEmployment.be  This test is no                          t yet approved or cleared by the [place] FDA and  has been authorized for detection and/or diagnosis of SARS-CoV-2 by FDA under an Emergency Use Authorization (EUA). This EUA will remain  in effect (meaning this test can be used) for the duration of the COVID-19 declaration under Section 564(b)(1) of the Act, 21 U.S.C.section 360bbb-3(b)(1), unless the authorization is terminated  or revoked sooner.       Influenza A by PCR 06/07/2022 NEGATIVE  NEGATIVE Final   Influenza B by PCR 06/07/2022 NEGATIVE  NEGATIVE Final   Comment: (NOTE) The Xpert Xpress SARS-CoV-2/FLU/RSV plus assay is intended as an aid in the diagnosis of influenza from Nasopharyngeal swab specimens and should not be used as a sole basis for treatment. Nasal washings and aspirates are unacceptable for Xpert Xpress SARS-CoV-2/FLU/RSV testing.  Fact Sheet for Patients: EntrepreneurPulse.com.au  Fact Sheet for Healthcare Providers: IncredibleEmployment.be  This test is not yet approved or cleared by the [place] FDA and has been authorized for detection and/or diagnosis of SARS-CoV-2 by FDA under an Emergency Use Authorization (EUA). This EUA will remain in effect (meaning this test can be used) for the duration of the COVID-19 declaration under Section 564(b)(1) of the Act, 21 U.S.C. section 360bbb-3(b)(1), unless the authorization is terminated  or revoked.  Performed at [facility], [place] [address] [place], [place] [address]    WBC 06/07/2022 5.7  4.0 - 10.5 K/uL Final   RBC 06/07/2022 4.39  3.87 - 5.11 MIL/uL Final   Hemoglobin 06/07/2022 13.2  12.0 - 15.0 g/dL Final   HCT 06/07/2022 40.4  36.0 - 46.0 % Final   MCV 06/07/2022 92.0  80.0 - 100.0 fL Final   MCH 06/07/2022 30.1  26.0 - 34.0 pg Final   MCHC 06/07/2022 32.7  30.0 - 36.0  g/dL Final   RDW 16/07/9603 12.4  11.5 - 15.5 % Final   Platelets 06/07/2022 308  150 - 400 K/uL Final   nRBC 06/07/2022 0.0  0.0 - 0.2 % Final   Neutrophils Relative % 06/07/2022 42  % Final   Neutro Abs 06/07/2022 2.4  1.7 - 7.7 K/uL Final   Lymphocytes Relative 06/07/2022 54  % Final   Lymphs Abs 06/07/2022 3.1  0.7 - 4.0 K/uL Final   Monocytes Relative 06/07/2022 4  % Final   Monocytes Absolute 06/07/2022 0.2  0.1 - 1.0 K/uL Final   Eosinophils Relative 06/07/2022 0  % Final   Eosinophils Absolute 06/07/2022 0.0  0.0 - 0.5 K/uL Final   Basophils Relative 06/07/2022 0  % Final   Basophils Absolute 06/07/2022 0.0  0.0 - 0.1 K/uL Final   Immature Granulocytes 06/07/2022 0  % Final   Abs Immature Granulocytes 06/07/2022 0.01  0.00 - 0.07 K/uL Final   Performed at [facility], [address], [place], [place] [address]   Sodium 06/07/2022 139  135 - 145 mmol/L Final   Potassium 06/07/2022 4.0  3.5 - 5.1 mmol/L Final   Chloride 06/07/2022 104  98 - 111 mmol/L Final   CO2 06/07/2022 28  22 - 32 mmol/L Final   Glucose, Bld 06/07/2022 104 (H)  70 - 99 mg/dL Final   Glucose reference range applies only to samples taken after fasting for at least 8 hours.   BUN 06/07/2022 8  6 - 20 mg/dL Final   Creatinine, Ser 06/07/2022 0.84  0.44 - 1.00 mg/dL Final   Calcium 11/91/4782 9.1  8.9 - 10.3 mg/dL Final   Total Protein 95/62/1308 6.9  6.5 - 8.1 g/dL Final   Albumin 65/78/4696 3.7  3.5 - 5.0 g/dL Final   AST 29/52/8413 19  15 - 41 U/L Final   ALT 06/07/2022 22  0 - 44 U/L Final    Alkaline Phosphatase 06/07/2022 54  38 - 126 U/L Final   Total Bilirubin 06/07/2022 0.4  0.3 - 1.2 mg/dL Final   GFR, Estimated 06/07/2022 >60  >60 mL/min Final   Comment: (NOTE) Calculated using the CKD-EPI Creatinine Equation (2021)    Anion gap 06/07/2022 7  5 - 15 Final   Performed at [facility], [address] [place], [place] [address]   Hgb A1c MFr Bld 06/07/2022 5.0  4.8 - 5.6 % Final   Comment: (NOTE) Pre diabetes:          5.7%-6.4%  Diabetes:              >6.4%  Glycemic control for   <7.0% adults with diabetes    Mean Plasma Glucose 06/07/2022 96.8  mg/dL Final   Performed at [facility], [address], [place], [place] [address]   TSH 06/07/2022 1.620  0.350 - 4.500 uIU/mL Final   Comment: Performed by a 3rd Generation assay with a functional sensitivity of <=0.01 uIU/mL. Performed at [facility], [address] [place], [place] [address]    RPR Ser Ql 06/07/2022 NON REACTIVE  NON REACTIVE Final   Performed at [facility], [address] [place], [place] [address]   Color, Urine 06/07/2022 YELLOW  YELLOW Final   APPearance 06/07/2022 HAZY (A)  CLEAR Final   Specific Gravity, Urine 06/07/2022 1.018  1.005 - 1.030 Final   pH 06/07/2022 7.0  5.0 - 8.0 Final   Glucose, UA 06/07/2022 NEGATIVE  NEGATIVE mg/dL Final   Hgb urine dipstick 06/07/2022 NEGATIVE  NEGATIVE Final   Bilirubin Urine 06/07/2022 NEGATIVE  NEGATIVE Final   Ketones, ur 06/07/2022 NEGATIVE  NEGATIVE mg/dL Final   Protein, ur 16/07/9603 NEGATIVE  NEGATIVE mg/dL Final   Nitrite 54/06/8118 NEGATIVE  NEGATIVE Final   Leukocytes,Ua 06/07/2022 NEGATIVE  NEGATIVE Final   Performed at [facility], [address], [place], [place] [address]   Cholesterol 06/07/2022 164  0 - 200 mg/dL Final   Triglycerides 95/62/1308 158 (H)  <150 mg/dL Final   HDL 65/78/4696 44  >40 mg/dL Final   Total CHOL/HDL Ratio 06/07/2022 3.7  RATIO Final   VLDL 06/07/2022 32  0 - 40 mg/dL Final    LDL Cholesterol 06/07/2022 88  0 - 99 mg/dL Final   Comment:        Total Cholesterol/HDL:CHD Risk Coronary Heart Disease Risk Table                     Men   Women  1/2 Average Risk   3.4   3.3  Average Risk       5.0   4.4  2 X Average Risk   9.6   7.1  3 X Average Risk  23.4   11.0        Use the calculated Patient Ratio above and the CHD Risk Table to determine the patient's CHD Risk.        ATP III CLASSIFICATION (LDL):  <100     mg/dL   Optimal  295-284  mg/dL   Near or Above                    Optimal  130-159  mg/dL   Borderline  132-440  mg/dL   High  >102     mg/dL   Very High Performed at [facility], [address], [place], [place] [address]    HIV Screen 4th Generation wRfx 06/07/2022 Non Reactive  Non Reactive Final   Performed at [facility], [address] [place], [place] [address]   SARSCOV2ONAVIRUS 2 AG 06/07/2022 NEGATIVE  NEGATIVE Final   Comment: (NOTE) SARS-CoV-2 antigen NOT DETECTED.   Negative results are presumptive.  Negative results do not preclude SARS-CoV-2 infection and should not be used as the sole basis for treatment or other patient management decisions, including infection  control decisions, particularly in the presence of clinical signs and  symptoms consistent with COVID-19, or in those who have been in contact with the virus.  Negative results must be combined with clinical observations, patient history, and epidemiological information. The expected result is Negative.  Fact Sheet for Patients: https://www.jennings-kim.com/  Fact Sheet for Healthcare Providers: https://alexander-rogers.biz/  This test is not yet approved or cleared by the [place] FDA and  has been authorized for detection and/or diagnosis of SARS-CoV-2 by FDA under an Emergency Use Authorization (EUA).  This EUA will remain in effect (meaning this test can be used) for the duration of  the COV                           ID-19 declaration under Section 564(b)(1) of the Act, 21 U.S.C. section 360bbb-3(b)(1), unless the authorization is terminated or revoked sooner.     POC Amphetamine UR 06/07/2022 None Detected  NONE DETECTED (Cut Off Level 1000 ng/mL) Final   POC Secobarbital (BAR) 06/07/2022 None Detected  NONE DETECTED (Cut Off Level 300 ng/mL) Final   POC Buprenorphine (BUP) 06/07/2022 None Detected  NONE DETECTED (Cut Off Level 10 ng/mL) Final   POC Oxazepam (BZO) 06/07/2022 None Detected  NONE DETECTED (Cut Off Level 300 ng/mL) Final   POC Cocaine UR 06/07/2022 None Detected  NONE DETECTED (Cut Off Level 300 ng/mL) Final   POC Methamphetamine UR 06/07/2022 None Detected  NONE DETECTED (Cut Off Level 1000 ng/mL) Final   POC Morphine 06/07/2022 None Detected  NONE DETECTED (Cut Off Level 300 ng/mL) Final   POC Methadone UR 06/07/2022 None Detected  NONE DETECTED (Cut Off Level 300 ng/mL) Final   POC Oxycodone UR 06/07/2022 None Detected  NONE DETECTED (Cut Off Level 100 ng/mL) Final   POC Marijuana UR 06/07/2022 None Detected  NONE DETECTED (Cut Off Level 50 ng/mL) Final   Preg Test, Ur 06/08/2022 NEGATIVE  NEGATIVE Final   Comment:        THE SENSITIVITY OF THIS METHODOLOGY IS >20 mIU/mL. Performed at [facility], [address] [place], [place] [address]    Preg Test, Ur 06/08/2022 NEGATIVE  NEGATIVE Final   Comment:        THE SENSITIVITY OF THIS METHODOLOGY IS >24 mIU/mL   Admission on 02/07/2022, Discharged on 02/08/2022  Component Date Value Ref Range Status   SARS Coronavirus 2 by RT PCR 02/07/2022 NEGATIVE  NEGATIVE Final   Comment: (NOTE) SARS-CoV-2 target nucleic acids are NOT DETECTED.  The SARS-CoV-2 RNA is generally detectable in upper respiratory specimens during the acute phase of infection. The lowest concentration of SARS-CoV-2 viral copies this assay can detect is 138 copies/mL. A negative result does not preclude SARS-Cov-2 infection and should not be used as the  sole basis for treatment or other patient management decisions. A negative result may occur with  improper specimen collection/handling, submission of specimen other than nasopharyngeal swab, presence of viral mutation(s) within the areas targeted by this assay, and inadequate number of viral copies(<138 copies/mL). A negative result must be combined with clinical observations, patient history, and epidemiological information. The expected result is Negative.  Fact Sheet for Patients:  BloggerCourse.com  Fact Sheet for Healthcare Providers:  SeriousBroker.it  This test is no                          t yet approved or cleared by the [place] FDA and  has been authorized for detection and/or diagnosis of SARS-CoV-2 by FDA under an Emergency Use Authorization (EUA). This EUA will remain  in effect (meaning this test can be used) for the duration of the COVID-19 declaration under Section 564(b)(1) of the Act, 21 U.S.C.section 360bbb-3(b)(1), unless the authorization is terminated  or revoked sooner.       Influenza A by PCR 02/07/2022 NEGATIVE  NEGATIVE Final   Influenza B by PCR 02/07/2022 NEGATIVE  NEGATIVE Final   Comment: (NOTE) The Xpert Xpress SARS-CoV-2/FLU/RSV plus assay is intended as an aid in the diagnosis of influenza from Nasopharyngeal swab specimens and should not be used as a sole basis for treatment. Nasal washings and aspirates are unacceptable for Xpert Xpress SARS-CoV-2/FLU/RSV testing.  Fact Sheet for Patients: BloggerCourse.com  Fact Sheet for Healthcare Providers: SeriousBroker.it  This test is not yet approved or cleared by the [place] FDA and has been authorized for detection and/or diagnosis of SARS-CoV-2 by FDA under an Emergency Use Authorization (EUA). This EUA will remain in effect (meaning this test  can be used) for the duration of  the COVID-19 declaration under Section 564(b)(1) of the Act, 21 U.S.C. section 360bbb-3(b)(1), unless the authorization is terminated or revoked.  Performed at [facility], [address] [place], [place] [address]    WBC 02/07/2022 8.1  4.0 - 10.5 K/uL Final   RBC 02/07/2022 4.55  3.87 - 5.11 MIL/uL Final   Hemoglobin 02/07/2022 13.6  12.0 - 15.0 g/dL Final   HCT 60/45/409804/27/2023 41.9  36.0 - 46.0 % Final   MCV 02/07/2022 92.1  80.0 - 100.0 fL Final   MCH 02/07/2022 29.9  26.0 - 34.0 pg Final   MCHC 02/07/2022 32.5  30.0 - 36.0 g/dL Final   RDW 11/91/478204/27/2023 12.9  11.5 - 15.5 % Final   Platelets 02/07/2022 286  150 - 400 K/uL Final   nRBC 02/07/2022 0.0  0.0 - 0.2 % Final   Neutrophils Relative % 02/07/2022 41  % Final   Neutro Abs 02/07/2022 3.3  1.7 - 7.7 K/uL Final   Lymphocytes Relative 02/07/2022 53  % Final   Lymphs Abs 02/07/2022 4.3 (H)  0.7 - 4.0 K/uL Final   Monocytes Relative 02/07/2022 6  % Final   Monocytes Absolute 02/07/2022 0.5  0.1 - 1.0 K/uL Final   Eosinophils Relative 02/07/2022 0  % Final   Eosinophils Absolute 02/07/2022 0.0  0.0 - 0.5 K/uL Final   Basophils Relative 02/07/2022 0  % Final   Basophils Absolute 02/07/2022 0.0  0.0 - 0.1 K/uL Final   Immature Granulocytes 02/07/2022 0  % Final   Abs Immature Granulocytes 02/07/2022 0.02  0.00 - 0.07 K/uL Final   Performed at [facility], [address] [place], [place] [address]   Sodium 02/07/2022 137  135 - 145 mmol/L Final   Potassium 02/07/2022 4.1  3.5 - 5.1 mmol/L Final   Chloride 02/07/2022 104  98 - 111 mmol/L Final   CO2 02/07/2022 29  22 - 32 mmol/L Final   Glucose, Bld 02/07/2022 92  70 - 99 mg/dL Final   Glucose reference range applies only to samples taken after fasting for at least 8 hours.   BUN 02/07/2022 10  6 - 20 mg/dL Final   Creatinine, Ser 02/07/2022 0.88  0.44 - 1.00 mg/dL Final   Calcium 30/86/578404/27/2023 9.4  8.9 - 10.3 mg/dL Final   Total Protein 69/62/952804/27/2023 7.2  6.5 - 8.1 g/dL  Final   Albumin 41/32/440104/27/2023 3.9  3.5 - 5.0 g/dL Final   AST 02/72/536604/27/2023 18  15 - 41 U/L Final   ALT 02/07/2022 25  0 - 44 U/L Final   Alkaline Phosphatase 02/07/2022 66  38 - 126 U/L Final   Total Bilirubin 02/07/2022 0.2 (L)  0.3 - 1.2 mg/dL Final   GFR, Estimated 02/07/2022 >60  >60 mL/min Final   Comment: (NOTE) Calculated using the CKD-EPI Creatinine Equation (2021)    Anion gap 02/07/2022 4 (L)  5 - 15 Final   Performed at [facility], [address] [place], [place] [address]   Hgb A1c MFr Bld 02/07/2022 5.0  4.8 - 5.6 % Final   Comment: (NOTE) Pre diabetes:          5.7%-6.4%  Diabetes:              >6.4%  Glycemic control for   <7.0% adults with diabetes    Mean Plasma Glucose 02/07/2022 96.8  mg/dL Final   Performed at [facility], [address] [place], [place] [address]   Alcohol, Ethyl (B) 02/07/2022 <10  <10 mg/dL Final   Comment: (NOTE) Lowest detectable limit for serum alcohol is 10 mg/dL.  For medical purposes only. Performed at [facility], [address] [place], [place] [address]    Cholesterol 02/07/2022 192  0 - 200 mg/dL Final   Triglycerides 09/81/1914 192 (H)  <150 mg/dL Final   HDL 78/29/5621 46  >40 mg/dL Final   Total CHOL/HDL Ratio 02/07/2022 4.2  RATIO Final   VLDL 02/07/2022 38  0 - 40 mg/dL Final   LDL Cholesterol 02/07/2022 108 (H)  0 - 99 mg/dL Final   Comment:        Total Cholesterol/HDL:CHD Risk Coronary Heart Disease Risk Table                     Men   Women  1/2 Average Risk   3.4   3.3  Average Risk       5.0   4.4  2 X Average Risk   9.6   7.1  3 X Average Risk  23.4   11.0        Use the calculated Patient Ratio above and the CHD Risk Table to determine the patient's CHD Risk.        ATP III CLASSIFICATION (LDL):  <100     mg/dL   Optimal  308-657  mg/dL   Near or Above                    Optimal  130-159  mg/dL   Borderline  846-962  mg/dL   High  >952     mg/dL   Very High Performed at [facility], [address], [place], [place] [address]    TSH 02/07/2022 6.344 (H)  0.350 - 4.500 uIU/mL Final   Comment: Performed by a 3rd Generation assay with a functional sensitivity of <=0.01 uIU/mL. Performed at [facility], [address], [place], [place] [address]    POC Amphetamine UR 02/07/2022 None Detected  NONE DETECTED (Cut Off Level 1000 ng/mL) Final   POC Secobarbital (BAR) 02/07/2022 None Detected  NONE DETECTED (Cut Off Level 300 ng/mL) Final   POC Buprenorphine (BUP) 02/07/2022 None Detected  NONE DETECTED (Cut Off Level 10 ng/mL) Final   POC Oxazepam (BZO) 02/07/2022 None Detected  NONE DETECTED (Cut Off Level 300 ng/mL) Final   POC Cocaine UR 02/07/2022 None Detected  NONE DETECTED (Cut Off Level 300 ng/mL) Final   POC Methamphetamine UR 02/07/2022 None Detected  NONE DETECTED (Cut Off Level 1000 ng/mL) Final   POC Morphine 02/07/2022 None Detected  NONE DETECTED (Cut Off Level 300 ng/mL) Final   POC Oxycodone UR 02/07/2022 None Detected  NONE DETECTED (Cut Off Level 100 ng/mL) Final   POC Methadone UR 02/07/2022 None Detected  NONE DETECTED (Cut Off Level 300 ng/mL) Final   POC Marijuana UR 02/07/2022 None Detected  NONE DETECTED (Cut Off Level 50 ng/mL) Final   SARSCOV2ONAVIRUS 2 AG 02/07/2022 NEGATIVE  NEGATIVE Final   Comment: (NOTE) SARS-CoV-2 antigen NOT DETECTED.   Negative results are presumptive.  Negative results do not preclude SARS-CoV-2 infection and should not be used as the sole basis for treatment or other patient management decisions, including infection  control decisions, particularly in the presence of clinical signs and  symptoms consistent with COVID-19, or in those who have been in contact with the  virus.  Negative results must be combined with clinical observations, patient history, and epidemiological information. The expected result is Negative.  Fact Sheet for Patients: https://www.jennings-kim.com/  Fact Sheet for  Healthcare Providers: https://alexander-rogers.biz/  This test is not yet approved or cleared by the [place] FDA and  has been authorized for detection and/or diagnosis of SARS-CoV-2 by FDA under an Emergency Use Authorization (EUA).  This EUA will remain in effect (meaning this test can be used) for the duration of  the COV                          ID-19 declaration under Section 564(b)(1) of the Act, 21 U.S.C. section 360bbb-3(b)(1), unless the authorization is terminated or revoked sooner.     Preg Test, Ur 02/07/2022 NEGATIVE  NEGATIVE Final   Comment:        THE SENSITIVITY OF THIS METHODOLOGY IS >24 mIU/mL    Blood Alcohol level:  Lab Results  Component Value Date   ETH <10 07/04/2022   ETH <10 02/07/2022   Metabolic Disorder Labs: Lab Results  Component Value Date   HGBA1C 5.0 06/07/2022   MPG 96.8 06/07/2022   MPG 96.8 02/07/2022   No results found for: "PROLACTIN" Lab Results  Component Value Date   CHOL 181 07/25/2022   TRIG 118 07/25/2022   HDL 45 07/25/2022   CHOLHDL 4.0 07/25/2022   VLDL 24 07/25/2022   LDLCALC 112 (H) 07/25/2022   LDLCALC 88 06/07/2022   Therapeutic Lab Levels: No results found for: "LITHIUM" No results found for: "VALPROATE" No results found for: "CBMZ" Physical Findings   GAD-7    Flowsheet Row [facility]/17/2021 in [facility]  Total GAD-7 Score 17      PHQ2-9    Flowsheet Row ED from 06/07/2022 in [facility] Office Visit from 12/17/2021 in [facility]  PHQ-2 Total Score 1 2  PHQ-9 Total Score 2 8      Flowsheet Row ED from 07/25/2022 in [facility] ED from 07/04/2022 in [facility] from 06/14/2022 in [facility]   C-SSRS RISK CATEGORY High Risk No Risk No Risk      Musculoskeletal  Strength & Muscle Tone: within  normal limits Gait & Station: normal Patient leans: N/A   Psychiatric Specialty Exam   Presentation  General Appearance: Disheveled  Eye Contact: Good  Speech: Clear and Coherent; Normal Rate  Volume: Decreased  Handedness:Right   Mood and Affect  Mood: -- ("tired")  Affect: Appropriate; Congruent; Constricted   Thought Process  Thought Process: Coherent; Goal Directed  Descriptions of Associations: Circumstantial   Thought Content Suicidal Thoughts:No  Homicidal Thoughts:No (thoughts of wanting to hit group home manager, not kill) Without Intent; Without Plan  Hallucinations:None Denied AH  Ideas of Reference:None  Thought Content:Logical; WDL   Sensorium  Memory:Immediate Fair  Judgment:Fair  Insight:Fair   Executive Functions  Orientation:Full (Time, Place and Person)  Language:Good  Concentration:Good  Attention:Good  Recall:Good  Fund of Knowledge:Fair   Psychomotor Activity  Psychomotor Activity:Normal   Assets  Assets:Communication Skills; Desire for Improvement; Housing; Leisure Time   Sleep  Quality:Fair  Physical Exam   BP 117/83 (BP Location: Right Arm)   Pulse 96   Temp 98.4 F (36.9 C) (Oral)   Resp 18   SpO2 98%  Physical Exam  Vitals and nursing note reviewed.  Constitutional:      General: She is not in acute distress.    Appearance: She is not ill-appearing or diaphoretic.  HENT:     Head: Normocephalic and atraumatic.  Pulmonary:     Effort: Pulmonary effort is normal. No respiratory distress.  Neurological:     General: No focal deficit present.     Mental Status: She is alert.     Assessment & Plan   Total Time spent with patient: 20 minutes Treatment Plan Summary: Daily contact with patient to assess and evaluate symptoms and progress in treatment and Medication management  Aniyia Rane is a 31 y.o. female with PMH of SCZ unspecified, MDD with psychosis, who presented voluntary to [facility]  (07/25/2022) via [facility] after a verbal altercation with [name] (not patient's legal guardian) for transient SI.  This is patient's fourth visit to [facility] for similar concerns this year.  Legal Guardian: Mom  Home medications restarted this encounter -Trileptal 300 mg p.o. twice daily mood stabilization -Seroquel 400 mg p.o. twice daily mood stabilization -Zoloft 150 mg p.o. daily   -NicoDerm CQ 24 hour  transdermal patch.   Other PRNs -Tylenol 650 mg p.o. every 6 hours as needed pain -Maalox 30 mm p.o. every 4 hours as needed indigestion -Atarax 25 mg p.o. 3 times daily as needed anxiety -MOM 30 mL p.o. daily as needed constipation -Trazodone 50 mg p.o. nightly as needed sleep  DISPO: Tentative date: TBA Location: TBA  Signed: Princess Bruins, DO Psychiatry Resident, PGY-2 [facility] 07/26/2022, 1:14 PM

## 2022-07-27 DIAGNOSIS — F333 Major depressive disorder, recurrent, severe with psychotic symptoms: Secondary | ICD-10-CM | POA: Diagnosis not present

## 2022-07-27 NOTE — ED Notes (Signed)
Pt is sleeping@this  time.breathing even and unlabored.will continue to monitor for safety

## 2022-07-27 NOTE — ED Provider Notes (Signed)
Behavioral Health Progress Note  Date and Time: 07/27/2022 10:13 AM Name: Nicole [name] MRN:  [id number]  Subjective:   Patient is a 31 year-old female with hx of Schizoaffective Disorder, MDD with psychosis, Bipolar, and GAD. Was admitted to the [facility] on 07/25/2022 after a verbal altercation with the group homeowner where she currently resides. She was threatening to harm the group homeowner and one of the other residents. Patient also was expressing SI, threatening to hang herself. She has been seen here multiple times this year for the same problem.    Diagnosis:  Final diagnoses:  Severe episode of recurrent major depressive disorder, with psychotic features ([place])    Total Time spent with patient: 30 minutes  Past Psychiatric History:  Patient was seen in bed awake. Chart and nursing notes reviewed. She was receptive upon approach and active during our conversation.  Appeared disheveled with decent hygiene. Alert and oriented x3. Denied SI/HI/AVH. Admitted that she was experiencing a rough time and "wanted to hurt the lady at the group home" where she resides. patient reports that the group homeowner does not seem to like her and does not appreciate what she does to help her. Patient admits that "I was going to hang myself". She admits to hx of hallucinations which is the reason she stays at the group home. Patient reports that she can not return to the group home "because they won't accept me". She reports that she now has nowhere to go and seeking help with placement. Patient reports no family history of mental illness. She reports hx of smoking Marijuana and drinking alcohol but not currently. She smokes cigarette, about  a pack on daily basis. Patient reports that her father is her primary support. She reports no acute medical condition but reports that she needs medication for herpes.  Current medication regimen includes Trileptal 300 mg PO BID, Seroquel 400 PO BID, Zoloft 150  mg PO daily, and Trazodone 50 mg PO HS, PRN. Patient is taking her medications without any concerns. She reports that she sleeps and eat well. Vital signs WNL.  Group homeowner was contacted [name] [phone]) and she reported that patient can not return to the group home because she threatened to kill her and other residents, and threatened to hang herself and "now the other residents are scared".  Past Medical History:  Past Medical History:  Diagnosis Date   Anxiety    Depression     Past Surgical History:  Procedure Laterality Date   WISDOM TOOTH EXTRACTION Bilateral 2020   Family History:  Family History  Problem Relation Age of Onset   Hypertension Father    Diabetes Father    Family Psychiatric  History: Patient denies family psychiatric hx Social History:  Social History   Substance and Sexual Activity  Alcohol Use Never     Social History   Substance and Sexual Activity  Drug Use Never    Social History   Socioeconomic History   Marital status: Single    Spouse name: Not on file   Number of children: Not on file   Years of education: Not on file   Highest education level: Not on file  Occupational History   Not on file  Tobacco Use   Smoking status: Every Day    Types: Cigarettes   Smokeless tobacco: Never  Vaping Use   Vaping Use: Some days  Substance and Sexual Activity   Alcohol use: Never   Drug use: Never   Sexual activity:  Yes    Partners: Female    Birth control/protection: Implant  Other Topics Concern   Not on file  Social History Narrative   Not on file   Social Determinants of Health   Financial Resource Strain: Not on file  Food Insecurity: Not on file  Transportation Needs: Not on file  Physical Activity: Not on file  Stress: Not on file  Social Connections: Not on file   SDOH:  SDOH Screenings   Depression (PHQ2-9): Low Risk  (06/10/2022)  Tobacco Use: High Risk (07/04/2022)   Additional Social History:    Pain  Medications: SEE MAR Prescriptions: SEE MAR Over the Counter: SEE MAR History of alcohol / drug use?: Yes Longest period of sobriety (when/how long): N/A Negative Consequences of Use:  (NONE) Withdrawal Symptoms: None Name of Substance 1: ETOH IN PAST--PT CURRENTLY DENIES 1 - Frequency: SOCIAL USE ETOH IN PAST 1 - Method of Aquiring: LEGAL 1- Route of Use: ORAL DRINK Name of Substance 2: [place] 2 - Amount (size/oz): LESS THAN ONE PACK 2 - Frequency: DAILY 2 - Method of Aquiring: LEGAL 2 - Route of Substance Use: ORAL SMOKE                Sleep: Good  Appetite:  Good  Current Medications:  Current Facility-Administered Medications  Medication Dose Route Frequency Provider Last Rate Last Admin   acetaminophen (TYLENOL) tablet 650 mg  650 mg Oral Q6H PRN Onuoha, Chinwendu V, NP   650 mg at 07/26/22 2134   alum & mag hydroxide-simeth (MAALOX/MYLANTA) 200-200-20 MG/5ML suspension 30 mL  30 mL Oral Q4H PRN Onuoha, Chinwendu V, NP       hydrOXYzine (ATARAX) tablet 25 mg  25 mg Oral TID PRN Onuoha, Chinwendu V, NP   25 mg at 07/25/22 2135   magnesium hydroxide (MILK OF MAGNESIA) suspension 30 mL  30 mL Oral Daily PRN Onuoha, Chinwendu V, NP       nicotine (NICODERM CQ - dosed in mg/24 hours) patch 14 mg  14 mg Transdermal Q0600 Onuoha, Chinwendu V, NP   14 mg at 07/27/22 [id number]   Oxcarbazepine (TRILEPTAL) tablet 300 mg  300 mg Oral BID Onuoha, Chinwendu V, NP   300 mg at 07/27/22 0952   QUEtiapine (SEROQUEL) tablet 400 mg  400 mg Oral BID Onuoha, Chinwendu V, NP   400 mg at 07/27/22 0952   sertraline (ZOLOFT) tablet 150 mg  150 mg Oral Daily Onuoha, Chinwendu V, NP   150 mg at 07/27/22 0953   traZODone (DESYREL) tablet 50 mg  50 mg Oral QHS PRN Onuoha, Chinwendu V, NP   50 mg at 07/26/22 2154   Current Outpatient Medications  Medication Sig Dispense Refill   ABILIFY MAINTENA 400 MG PRSY prefilled syringe 400 mg every 28 (twenty-eight) days.     cetirizine  (ZYRTEC) 10 MG tablet Take 10 mg by mouth daily.     cyclobenzaprine (FLEXERIL) 10 MG tablet Take 1 tablet (10 mg total) by mouth 2 (two) times daily as needed for muscle spasms. 20 tablet 0   fluticasone (FLONASE) 50 MCG/ACT nasal spray Place 1 spray into both nostrils daily.     hydrOXYzine (ATARAX) 50 MG tablet Take 1 tablet (50 mg total) by mouth 2 (two) times daily. 60 tablet 0   meloxicam (MOBIC) 15 MG tablet Take 15 mg by mouth daily.     Oxcarbazepine (TRILEPTAL) 300 MG tablet Take 1 tablet (300 mg total) by mouth 2 (two) times daily.  60 tablet 0   QUEtiapine (SEROQUEL) 400 MG tablet Take 1 tablet (400 mg total) by mouth 2 (two) times daily. 60 tablet 0   sertraline (ZOLOFT) 50 MG tablet Take 3 tablets (150 mg total) by mouth in the morning. 90 tablet 0   traZODone (DESYREL) 100 MG tablet Take 1 tablet (100 mg total) by mouth at bedtime. 30 tablet 0   valACYclovir (VALTREX) 500 MG tablet Take 500 mg by mouth daily.      Labs  Lab Results:  Admission on 07/25/2022  Component Date Value Ref Range Status   SARS Coronavirus 2 by RT PCR 07/25/2022 NEGATIVE  NEGATIVE Final   Comment: (NOTE) SARS-CoV-2 target nucleic acids are NOT DETECTED.  The SARS-CoV-2 RNA is generally detectable in upper respiratory specimens during the acute phase of infection. The lowest concentration of SARS-CoV-2 viral copies this assay can detect is 138 copies/mL. A negative result does not preclude SARS-Cov-2 infection and should not be used as the sole basis for treatment or other patient management decisions. A negative result may occur with  improper specimen collection/handling, submission of specimen other than nasopharyngeal swab, presence of viral mutation(s) within the areas targeted by this assay, and inadequate number of viral copies(<138 copies/mL). A negative result must be combined with clinical observations, patient history, and epidemiological information. The expected result is  Negative.  Fact Sheet for Patients:  EntrepreneurPulse.com.au  Fact Sheet for Healthcare Providers:  IncredibleEmployment.be  This test is no                          t yet approved or cleared by the [place] FDA and  has been authorized for detection and/or diagnosis of SARS-CoV-2 by FDA under an Emergency Use Authorization (EUA). This EUA will remain  in effect (meaning this test can be used) for the duration of the COVID-19 declaration under Section 564(b)(1) of the Act, 21 U.S.C.section 360bbb-3(b)(1), unless the authorization is terminated  or revoked sooner.       Influenza A by PCR 07/25/2022 NEGATIVE  NEGATIVE Final   Influenza B by PCR 07/25/2022 NEGATIVE  NEGATIVE Final   Comment: (NOTE) The Xpert Xpress SARS-CoV-2/FLU/RSV plus assay is intended as an aid in the diagnosis of influenza from Nasopharyngeal swab specimens and should not be used as a sole basis for treatment. Nasal washings and aspirates are unacceptable for Xpert Xpress SARS-CoV-2/FLU/RSV testing.  Fact Sheet for Patients: EntrepreneurPulse.com.au  Fact Sheet for Healthcare Providers: IncredibleEmployment.be  This test is not yet approved or cleared by the [place] FDA and has been authorized for detection and/or diagnosis of SARS-CoV-2 by FDA under an Emergency Use Authorization (EUA). This EUA will remain in effect (meaning this test can be used) for the duration of the COVID-19 declaration under Section 564(b)(1) of the Act, 21 U.S.C. section 360bbb-3(b)(1), unless the authorization is terminated or revoked.  Performed at [facility], [place] [address] [place], [place] [address]    WBC 07/25/2022 8.3  4.0 - 10.5 K/uL Final   RBC 07/25/2022 4.44  3.87 - 5.11 MIL/uL Final   Hemoglobin 07/25/2022 13.7  12.0 - 15.0 g/dL Final   HCT 07/25/2022 40.2  36.0 - 46.0 % Final   MCV 07/25/2022 90.5  80.0 - 100.0 fL Final    MCH 07/25/2022 30.9  26.0 - 34.0 pg Final   MCHC 07/25/2022 34.1  30.0 - 36.0 g/dL Final   RDW 07/25/2022 12.2  11.5 - 15.5 %  Final   Platelets 07/25/2022 248  150 - 400 K/uL Final   nRBC 07/25/2022 0.0  0.0 - 0.2 % Final   Neutrophils Relative % 07/25/2022 43  % Final   Neutro Abs 07/25/2022 3.6  1.7 - 7.7 K/uL Final   Lymphocytes Relative 07/25/2022 52  % Final   Lymphs Abs 07/25/2022 4.2 (H)  0.7 - 4.0 K/uL Final   Monocytes Relative 07/25/2022 5  % Final   Monocytes Absolute 07/25/2022 0.4  0.1 - 1.0 K/uL Final   Eosinophils Relative 07/25/2022 0  % Final   Eosinophils Absolute 07/25/2022 0.0  0.0 - 0.5 K/uL Final   Basophils Relative 07/25/2022 0  % Final   Basophils Absolute 07/25/2022 0.0  0.0 - 0.1 K/uL Final   Immature Granulocytes 07/25/2022 0  % Final   Abs Immature Granulocytes 07/25/2022 0.02  0.00 - 0.07 K/uL Final   Performed at [facility], [place] [address], [place], [place] [address]   Sodium 07/25/2022 138  135 - 145 mmol/L Final   Potassium 07/25/2022 4.0  3.5 - 5.1 mmol/L Final   Chloride 07/25/2022 104  98 - 111 mmol/L Final   CO2 07/25/2022 29  22 - 32 mmol/L Final   Glucose, Bld 07/25/2022 83  70 - 99 mg/dL Final   Glucose reference range applies only to samples taken after fasting for at least 8 hours.   BUN 07/25/2022 11  6 - 20 mg/dL Final   Creatinine, Ser 07/25/2022 0.97  0.44 - 1.00 mg/dL Final   Calcium 07/25/2022 9.2  8.9 - 10.3 mg/dL Final   Total Protein 07/25/2022 7.0  6.5 - 8.1 g/dL Final   Albumin 07/25/2022 3.8  3.5 - 5.0 g/dL Final   AST 07/25/2022 18  15 - 41 U/L Final   ALT 07/25/2022 22  0 - 44 U/L Final   Alkaline Phosphatase 07/25/2022 64  38 - 126 U/L Final   Total Bilirubin 07/25/2022 0.2 (L)  0.3 - 1.2 mg/dL Final   GFR, Estimated 07/25/2022 >60  >60 mL/min Final   Comment: (NOTE) Calculated using the CKD-EPI Creatinine Equation (2021)    Anion gap 07/25/2022 5  5 - 15 Final   Performed at [facility] [address], [place], [place] [address]   POC Amphetamine UR 07/25/2022 None Detected  NONE DETECTED (Cut Off Level 1000 ng/mL) Preliminary   POC Secobarbital (BAR) 07/25/2022 None Detected  NONE DETECTED (Cut Off Level 300 ng/mL) Preliminary   POC Buprenorphine (BUP) 07/25/2022 None Detected  NONE DETECTED (Cut Off Level 10 ng/mL) Preliminary   POC Oxazepam (BZO) 07/25/2022 None Detected  NONE DETECTED (Cut Off Level 300 ng/mL) Preliminary   POC Cocaine UR 07/25/2022 None Detected  NONE DETECTED (Cut Off Level 300 ng/mL) Preliminary   POC Methamphetamine UR 07/25/2022 None Detected  NONE DETECTED (Cut Off Level 1000 ng/mL) Preliminary   POC Morphine 07/25/2022 None Detected  NONE DETECTED (Cut Off Level 300 ng/mL) Preliminary   POC Methadone UR 07/25/2022 None Detected  NONE DETECTED (Cut Off Level 300 ng/mL) Preliminary   POC Oxycodone UR 07/25/2022 None Detected  NONE DETECTED (Cut Off Level 100 ng/mL) Preliminary   POC Marijuana UR 07/25/2022 None Detected  NONE DETECTED (Cut Off Level 50 ng/mL) Preliminary   SARSCOV2ONAVIRUS 2 AG 07/25/2022 NEGATIVE  NEGATIVE Final   Comment: (NOTE) SARS-CoV-2 antigen NOT DETECTED.   Negative results are presumptive.  Negative results do not preclude SARS-CoV-2 infection and should not be used as  the sole basis for treatment or other patient management decisions, including infection  control decisions, particularly in the presence of clinical signs and  symptoms consistent with COVID-19, or in those who have been in contact with the virus.  Negative results must be combined with clinical observations, patient history, and epidemiological information. The expected result is Negative.  Fact Sheet for Patients: HandmadeRecipes.com.cy  Fact Sheet for Healthcare Providers: FuneralLife.at  This test is not yet approved or cleared by the [place] FDA and  has been authorized for detection and/or diagnosis of SARS-CoV-2  by FDA under an Emergency Use Authorization (EUA).  This EUA will remain in effect (meaning this test can be used) for the duration of  the COV                          ID-19 declaration under Section 564(b)(1) of the Act, 21 U.S.C. section 360bbb-3(b)(1), unless the authorization is terminated or revoked sooner.     Cholesterol 07/25/2022 181  0 - 200 mg/dL Final   Triglycerides 07/25/2022 118  <150 mg/dL Final   HDL 07/25/2022 45  >40 mg/dL Final   Total CHOL/HDL Ratio 07/25/2022 4.0  RATIO Final   VLDL 07/25/2022 24  0 - 40 mg/dL Final   LDL Cholesterol 07/25/2022 112 (H)  0 - 99 mg/dL Final   Comment:        Total Cholesterol/HDL:CHD Risk Coronary Heart Disease Risk Table                     Men   Women  1/2 Average Risk   3.4   3.3  Average Risk       5.0   4.4  2 X Average Risk   9.6   7.1  3 X Average Risk  23.4   11.0        Use the calculated Patient Ratio above and the CHD Risk Table to determine the patient's CHD Risk.        ATP III CLASSIFICATION (LDL):  <100     mg/dL   Optimal  100-129  mg/dL   Near or Above                    Optimal  130-159  mg/dL   Borderline  160-189  mg/dL   High  >190     mg/dL   Very High Performed at [facility] [address], [place], [place] [address]    TSH 07/25/2022 6.668 (H)  0.350 - 4.500 uIU/mL Final   Comment: Performed by a 3rd Generation assay with a functional sensitivity of <=0.01 uIU/mL. Performed at [facility], [place] [address], [place], [place] [address]    Glucose-Capillary 07/26/2022 104 (H)  70 - 99 mg/dL Final   Glucose reference range applies only to samples taken after fasting for at least 8 hours.  Admission on 07/04/2022, Discharged on 07/05/2022  Component Date Value Ref Range Status   Sodium 07/04/2022 142  135 - 145 mmol/L Final   Potassium 07/04/2022 4.4  3.5 - 5.1 mmol/L Final   Chloride 07/04/2022 109  98 - 111 mmol/L Final   CO2 07/04/2022 26  22 - 32 mmol/L Final   Glucose, Bld  07/04/2022 96  70 - 99 mg/dL Final   Glucose reference range applies only to samples taken after fasting for at least 8 hours.   BUN 07/04/2022 15  6 -  20 mg/dL Final   Creatinine, Ser 07/04/2022 0.83  0.44 - 1.00 mg/dL Final   Calcium 07/04/2022 9.3  8.9 - 10.3 mg/dL Final   Total Protein 07/04/2022 7.2  6.5 - 8.1 g/dL Final   Albumin 07/04/2022 3.9  3.5 - 5.0 g/dL Final   AST 07/04/2022 23  15 - 41 U/L Final   ALT 07/04/2022 28  0 - 44 U/L Final   Alkaline Phosphatase 07/04/2022 77  38 - 126 U/L Final   Total Bilirubin 07/04/2022 0.4  0.3 - 1.2 mg/dL Final   GFR, Estimated 07/04/2022 >60  >60 mL/min Final   Comment: (NOTE) Calculated using the CKD-EPI Creatinine Equation (2021)    Anion gap 07/04/2022 7  5 - 15 Final   Performed at [facility], [place] [address] [place], [place] [address]   Alcohol, Ethyl (B) 07/04/2022 <10  <10 mg/dL Final   Comment: (NOTE) Lowest detectable limit for serum alcohol is 10 mg/dL.  For medical purposes only. Performed at [facility], [place] [address], [place], [place] [address]    WBC 07/04/2022 7.0  4.0 - 10.5 K/uL Final   RBC 07/04/2022 4.18  3.87 - 5.11 MIL/uL Final   Hemoglobin 07/04/2022 12.8  12.0 - 15.0 g/dL Final   HCT 07/04/2022 39.1  36.0 - 46.0 % Final   MCV 07/04/2022 93.5  80.0 - 100.0 fL Final   MCH 07/04/2022 30.6  26.0 - 34.0 pg Final   MCHC 07/04/2022 32.7  30.0 - 36.0 g/dL Final   RDW 07/04/2022 12.9  11.5 - 15.5 % Final   Platelets 07/04/2022 243  150 - 400 K/uL Final   nRBC 07/04/2022 0.0  0.0 - 0.2 % Final   Neutrophils Relative % 07/04/2022 43  % Final   Neutro Abs 07/04/2022 3.0  1.7 - 7.7 K/uL Final   Lymphocytes Relative 07/04/2022 50  % Final   Lymphs Abs 07/04/2022 3.5  0.7 - 4.0 K/uL Final   Monocytes Relative 07/04/2022 7  % Final   Monocytes Absolute 07/04/2022 0.5  0.1 - 1.0 K/uL Final   Eosinophils Relative 07/04/2022 0  % Final   Eosinophils Absolute 07/04/2022 0.0  0.0 -  0.5 K/uL Final   Basophils Relative 07/04/2022 0  % Final   Basophils Absolute 07/04/2022 0.0  0.0 - 0.1 K/uL Final   Immature Granulocytes 07/04/2022 0  % Final   Abs Immature Granulocytes 07/04/2022 0.01  0.00 - 0.07 K/uL Final   Performed at [facility], [place] [address] [place], [place] [address]   I-stat hCG, quantitative 07/04/2022 <5.0  <5 mIU/mL Final   Comment 3 07/04/2022          Final   Comment:   GEST. AGE      CONC.  (mIU/mL)   <=1 WEEK        5 - 50     2 WEEKS       50 - 500     3 WEEKS       100 - 10,000     4 WEEKS     1,000 - 30,000        FEMALE AND NON-PREGNANT FEMALE:     LESS THAN 5 mIU/mL   Admission on 06/14/2022, Discharged on 06/14/2022  Component Date Value Ref Range Status   Color, UA 06/14/2022 yellow  yellow Final   Clarity, UA 06/14/2022 cloudy (A)  clear Final   Glucose, UA 06/14/2022 negative  negative mg/dL  Final   Bilirubin, UA 06/14/2022 negative  negative Final   Ketones, POC UA 06/14/2022 negative  negative mg/dL Final   Spec Grav, UA 06/14/2022 1.025  1.010 - 1.025 Final   Blood, UA 06/14/2022 negative  negative Final   pH, UA 06/14/2022 7.5  5.0 - 8.0 Final   Protein Ur, POC 06/14/2022 =30 (A)  negative mg/dL Final   Urobilinogen, UA 06/14/2022 0.2  0.2 or 1.0 E.U./dL Final   Nitrite, UA 06/14/2022 Negative  Negative Final   Leukocytes, UA 06/14/2022 Negative  Negative Final   Preg Test, Ur 06/14/2022 Negative  Negative Final   Specimen Description 06/14/2022 URINE, CLEAN CATCH   Final   Special Requests 06/14/2022    Final                   Value:NONE Performed at [facility] [address] [place], [place] [address]    Culture 06/14/2022 MULTIPLE SPECIES PRESENT, SUGGEST RECOLLECTION (A)   Final   Report Status 06/14/2022 06/16/2022 FINAL   Final  Admission on 06/07/2022, Discharged on 06/10/2022  Component Date Value Ref Range Status   SARS Coronavirus 2 by RT PCR 06/07/2022 NEGATIVE  NEGATIVE Final    Comment: (NOTE) SARS-CoV-2 target nucleic acids are NOT DETECTED.  The SARS-CoV-2 RNA is generally detectable in upper respiratory specimens during the acute phase of infection. The lowest concentration of SARS-CoV-2 viral copies this assay can detect is 138 copies/mL. A negative result does not preclude SARS-Cov-2 infection and should not be used as the sole basis for treatment or other patient management decisions. A negative result may occur with  improper specimen collection/handling, submission of specimen other than nasopharyngeal swab, presence of viral mutation(s) within the areas targeted by this assay, and inadequate number of viral copies(<138 copies/mL). A negative result must be combined with clinical observations, patient history, and epidemiological information. The expected result is Negative.  Fact Sheet for Patients:  EntrepreneurPulse.com.au  Fact Sheet for Healthcare Providers:  IncredibleEmployment.be  This test is no                          t yet approved or cleared by the [place] FDA and  has been authorized for detection and/or diagnosis of SARS-CoV-2 by FDA under an Emergency Use Authorization (EUA). This EUA will remain  in effect (meaning this test can be used) for the duration of the COVID-19 declaration under Section 564(b)(1) of the Act, 21 U.S.C.section 360bbb-3(b)(1), unless the authorization is terminated  or revoked sooner.       Influenza A by PCR 06/07/2022 NEGATIVE  NEGATIVE Final   Influenza B by PCR 06/07/2022 NEGATIVE  NEGATIVE Final   Comment: (NOTE) The Xpert Xpress SARS-CoV-2/FLU/RSV plus assay is intended as an aid in the diagnosis of influenza from Nasopharyngeal swab specimens and should not be used as a sole basis for treatment. Nasal washings and aspirates are unacceptable for Xpert Xpress SARS-CoV-2/FLU/RSV testing.  Fact Sheet for  Patients: EntrepreneurPulse.com.au  Fact Sheet for Healthcare Providers: IncredibleEmployment.be  This test is not yet approved or cleared by the [place] FDA and has been authorized for detection and/or diagnosis of SARS-CoV-2 by FDA under an Emergency Use Authorization (EUA). This EUA will remain in effect (meaning this test can be used) for the duration of the COVID-19 declaration under Section 564(b)(1) of the Act, 21 U.S.C. section 360bbb-3(b)(1), unless the authorization is terminated or revoked.  Performed at [facility], [place] [address], [place], [place] [address]    WBC 06/07/2022 5.7  4.0 - 10.5 K/uL Final   RBC 06/07/2022 4.39  3.87 - 5.11 MIL/uL Final   Hemoglobin 06/07/2022 13.2  12.0 - 15.0 g/dL Final   HCT 06/07/2022 40.4  36.0 - 46.0 % Final   MCV 06/07/2022 92.0  80.0 - 100.0 fL Final   MCH 06/07/2022 30.1  26.0 - 34.0 pg Final   MCHC 06/07/2022 32.7  30.0 - 36.0 g/dL Final   RDW 06/07/2022 12.4  11.5 - 15.5 % Final   Platelets 06/07/2022 308  150 - 400 K/uL Final   nRBC 06/07/2022 0.0  0.0 - 0.2 % Final   Neutrophils Relative % 06/07/2022 42  % Final   Neutro Abs 06/07/2022 2.4  1.7 - 7.7 K/uL Final   Lymphocytes Relative 06/07/2022 54  % Final   Lymphs Abs 06/07/2022 3.1  0.7 - 4.0 K/uL Final   Monocytes Relative 06/07/2022 4  % Final   Monocytes Absolute 06/07/2022 0.2  0.1 - 1.0 K/uL Final   Eosinophils Relative 06/07/2022 0  % Final   Eosinophils Absolute 06/07/2022 0.0  0.0 - 0.5 K/uL Final   Basophils Relative 06/07/2022 0  % Final   Basophils Absolute 06/07/2022 0.0  0.0 - 0.1 K/uL Final   Immature Granulocytes 06/07/2022 0  % Final   Abs Immature Granulocytes 06/07/2022 0.01  0.00 - 0.07 K/uL Final   Performed at [facility], [place] [address] [place], [place] [address]   Sodium 06/07/2022 139  135 - 145 mmol/L Final   Potassium 06/07/2022 4.0  3.5 - 5.1 mmol/L Final   Chloride 06/07/2022 104  98 -  111 mmol/L Final   CO2 06/07/2022 28  22 - 32 mmol/L Final   Glucose, Bld 06/07/2022 104 (H)  70 - 99 mg/dL Final   Glucose reference range applies only to samples taken after fasting for at least 8 hours.   BUN 06/07/2022 8  6 - 20 mg/dL Final   Creatinine, Ser 06/07/2022 0.84  0.44 - 1.00 mg/dL Final   Calcium 06/07/2022 9.1  8.9 - 10.3 mg/dL Final   Total Protein 06/07/2022 6.9  6.5 - 8.1 g/dL Final   Albumin 06/07/2022 3.7  3.5 - 5.0 g/dL Final   AST 06/07/2022 19  15 - 41 U/L Final   ALT 06/07/2022 22  0 - 44 U/L Final   Alkaline Phosphatase 06/07/2022 54  38 - 126 U/L Final   Total Bilirubin 06/07/2022 0.4  0.3 - 1.2 mg/dL Final   GFR, Estimated 06/07/2022 >60  >60 mL/min Final   Comment: (NOTE) Calculated using the CKD-EPI Creatinine Equation (2021)    Anion gap 06/07/2022 7  5 - 15 Final   Performed at [facility] [address], [place], [place] [address]   Hgb A1c MFr Bld 06/07/2022 5.0  4.8 - 5.6 % Final   Comment: (NOTE) Pre diabetes:          5.7%-6.4%  Diabetes:              >6.4%  Glycemic control for   <7.0% adults with diabetes    Mean Plasma Glucose 06/07/2022 96.8  mg/dL Final   Performed at [facility], [place] [address] [place], [place] [address]   TSH 06/07/2022 1.620  0.350 - 4.500 uIU/mL Final   Comment: Performed by a 3rd Generation assay with a functional sensitivity of <=0.01 uIU/mL. Performed at  [facility], [place] [address] [place], [place] [address]    RPR Ser Ql 06/07/2022 NON REACTIVE  NON REACTIVE Final   Performed at [facility], [place] [address], [place], [place] [address]   Color, Urine 06/07/2022 YELLOW  YELLOW Final   APPearance 06/07/2022 HAZY (A)  CLEAR Final   Specific Gravity, Urine 06/07/2022 1.018  1.005 - 1.030 Final   pH 06/07/2022 7.0  5.0 - 8.0 Final   Glucose, UA 06/07/2022 NEGATIVE  NEGATIVE mg/dL Final   Hgb urine dipstick 06/07/2022 NEGATIVE  NEGATIVE Final   Bilirubin Urine 06/07/2022 NEGATIVE   NEGATIVE Final   Ketones, ur 06/07/2022 NEGATIVE  NEGATIVE mg/dL Final   Protein, ur 06/07/2022 NEGATIVE  NEGATIVE mg/dL Final   Nitrite 06/07/2022 NEGATIVE  NEGATIVE Final   Leukocytes,Ua 06/07/2022 NEGATIVE  NEGATIVE Final   Performed at [facility], [place] [address], [place], [place] [address]   Cholesterol 06/07/2022 164  0 - 200 mg/dL Final   Triglycerides 06/07/2022 158 (H)  <150 mg/dL Final   HDL 06/07/2022 44  >40 mg/dL Final   Total CHOL/HDL Ratio 06/07/2022 3.7  RATIO Final   VLDL 06/07/2022 32  0 - 40 mg/dL Final   LDL Cholesterol 06/07/2022 88  0 - 99 mg/dL Final   Comment:        Total Cholesterol/HDL:CHD Risk Coronary Heart Disease Risk Table                     Men   Women  1/2 Average Risk   3.4   3.3  Average Risk       5.0   4.4  2 X Average Risk   9.6   7.1  3 X Average Risk  23.4   11.0        Use the calculated Patient Ratio above and the CHD Risk Table to determine the patient's CHD Risk.        ATP III CLASSIFICATION (LDL):  <100     mg/dL   Optimal  100-129  mg/dL   Near or Above                    Optimal  130-159  mg/dL   Borderline  160-189  mg/dL   High  >190     mg/dL   Very High Performed at [place] [address], [place], [place] [address]    HIV Screen 4th Generation wRfx 06/07/2022 Non Reactive  Non Reactive Final   Performed at [facility], [place] [address], [place], [place] [address]   SARSCOV2ONAVIRUS 2 AG 06/07/2022 NEGATIVE  NEGATIVE Final   Comment: (NOTE) SARS-CoV-2 antigen NOT DETECTED.   Negative results are presumptive.  Negative results do not preclude SARS-CoV-2 infection and should not be used as the sole basis for treatment or other patient management decisions, including infection  control decisions, particularly in the presence of clinical signs and  symptoms consistent with COVID-19, or in those who have been in contact with the virus.  Negative results must be combined with clinical observations,  patient history, and epidemiological information. The expected result is Negative.  Fact Sheet for Patients: HandmadeRecipes.com.cy  Fact Sheet for Healthcare Providers: FuneralLife.at  This test is not yet approved or cleared by the [place] FDA and  has been authorized for detection and/or diagnosis of SARS-CoV-2 by FDA under an Emergency Use Authorization (EUA).  This EUA will remain in  effect (meaning this test can be used) for the duration of  the COV                          ID-19 declaration under Section 564(b)(1) of the Act, 21 U.S.C. section 360bbb-3(b)(1), unless the authorization is terminated or revoked sooner.     POC Amphetamine UR 06/07/2022 None Detected  NONE DETECTED (Cut Off Level 1000 ng/mL) Final   POC Secobarbital (BAR) 06/07/2022 None Detected  NONE DETECTED (Cut Off Level 300 ng/mL) Final   POC Buprenorphine (BUP) 06/07/2022 None Detected  NONE DETECTED (Cut Off Level 10 ng/mL) Final   POC Oxazepam (BZO) 06/07/2022 None Detected  NONE DETECTED (Cut Off Level 300 ng/mL) Final   POC Cocaine UR 06/07/2022 None Detected  NONE DETECTED (Cut Off Level 300 ng/mL) Final   POC Methamphetamine UR 06/07/2022 None Detected  NONE DETECTED (Cut Off Level 1000 ng/mL) Final   POC Morphine 06/07/2022 None Detected  NONE DETECTED (Cut Off Level 300 ng/mL) Final   POC Methadone UR 06/07/2022 None Detected  NONE DETECTED (Cut Off Level 300 ng/mL) Final   POC Oxycodone UR 06/07/2022 None Detected  NONE DETECTED (Cut Off Level 100 ng/mL) Final   POC Marijuana UR 06/07/2022 None Detected  NONE DETECTED (Cut Off Level 50 ng/mL) Final   Preg Test, Ur 06/08/2022 NEGATIVE  NEGATIVE Final   Comment:        THE SENSITIVITY OF THIS METHODOLOGY IS >20 mIU/mL. Performed at [facility], [place] [address], [place], [place] [address]    Preg Test, Ur 06/08/2022 NEGATIVE  NEGATIVE Final   Comment:        THE SENSITIVITY OF  THIS METHODOLOGY IS >24 mIU/mL   Admission on 02/07/2022, Discharged on 02/08/2022  Component Date Value Ref Range Status   SARS Coronavirus 2 by RT PCR 02/07/2022 NEGATIVE  NEGATIVE Final   Comment: (NOTE) SARS-CoV-2 target nucleic acids are NOT DETECTED.  The SARS-CoV-2 RNA is generally detectable in upper respiratory specimens during the acute phase of infection. The lowest concentration of SARS-CoV-2 viral copies this assay can detect is 138 copies/mL. A negative result does not preclude SARS-Cov-2 infection and should not be used as the sole basis for treatment or other patient management decisions. A negative result may occur with  improper specimen collection/handling, submission of specimen other than nasopharyngeal swab, presence of viral mutation(s) within the areas targeted by this assay, and inadequate number of viral copies(<138 copies/mL). A negative result must be combined with clinical observations, patient history, and epidemiological information. The expected result is Negative.  Fact Sheet for Patients:  EntrepreneurPulse.com.au  Fact Sheet for Healthcare Providers:  IncredibleEmployment.be  This test is no                          t yet approved or cleared by the [place] FDA and  has been authorized for detection and/or diagnosis of SARS-CoV-2 by FDA under an Emergency Use Authorization (EUA). This EUA will remain  in effect (meaning this test can be used) for the duration of the COVID-19 declaration under Section 564(b)(1) of the Act, 21 U.S.C.section 360bbb-3(b)(1), unless the authorization is terminated  or revoked sooner.       Influenza A by PCR 02/07/2022 NEGATIVE  NEGATIVE Final   Influenza B by PCR 02/07/2022 NEGATIVE  NEGATIVE Final   Comment: (NOTE) The Xpert Xpress SARS-CoV-2/FLU/RSV plus assay is intended as  an aid in the diagnosis of influenza from Nasopharyngeal swab specimens and should not be used  as a sole basis for treatment. Nasal washings and aspirates are unacceptable for Xpert Xpress SARS-CoV-2/FLU/RSV testing.  Fact Sheet for Patients: BloggerCourse.com  Fact Sheet for Healthcare Providers: SeriousBroker.it  This test is not yet approved or cleared by the [place] FDA and has been authorized for detection and/or diagnosis of SARS-CoV-2 by FDA under an Emergency Use Authorization (EUA). This EUA will remain in effect (meaning this test can be used) for the duration of the COVID-19 declaration under Section 564(b)(1) of the Act, 21 U.S.C. section 360bbb-3(b)(1), unless the authorization is terminated or revoked.  Performed at [facility], [address], [place], [place] [address]    WBC 02/07/2022 8.1  4.0 - 10.5 K/uL Final   RBC 02/07/2022 4.55  3.87 - 5.11 MIL/uL Final   Hemoglobin 02/07/2022 13.6  12.0 - 15.0 g/dL Final   HCT 37/85/8850 41.9  36.0 - 46.0 % Final   MCV 02/07/2022 92.1  80.0 - 100.0 fL Final   MCH 02/07/2022 29.9  26.0 - 34.0 pg Final   MCHC 02/07/2022 32.5  30.0 - 36.0 g/dL Final   RDW 27/74/1287 12.9  11.5 - 15.5 % Final   Platelets 02/07/2022 286  150 - 400 K/uL Final   nRBC 02/07/2022 0.0  0.0 - 0.2 % Final   Neutrophils Relative % 02/07/2022 41  % Final   Neutro Abs 02/07/2022 3.3  1.7 - 7.7 K/uL Final   Lymphocytes Relative 02/07/2022 53  % Final   Lymphs Abs 02/07/2022 4.3 (H)  0.7 - 4.0 K/uL Final   Monocytes Relative 02/07/2022 6  % Final   Monocytes Absolute 02/07/2022 0.5  0.1 - 1.0 K/uL Final   Eosinophils Relative 02/07/2022 0  % Final   Eosinophils Absolute 02/07/2022 0.0  0.0 - 0.5 K/uL Final   Basophils Relative 02/07/2022 0  % Final   Basophils Absolute 02/07/2022 0.0  0.0 - 0.1 K/uL Final   Immature Granulocytes 02/07/2022 0  % Final   Abs Immature Granulocytes 02/07/2022 0.02  0.00 - 0.07 K/uL Final   Performed at [facility], [address]  [place], [place] [address]   Sodium 02/07/2022 137  135 - 145 mmol/L Final   Potassium 02/07/2022 4.1  3.5 - 5.1 mmol/L Final   Chloride 02/07/2022 104  98 - 111 mmol/L Final   CO2 02/07/2022 29  22 - 32 mmol/L Final   Glucose, Bld 02/07/2022 92  70 - 99 mg/dL Final   Glucose reference range applies only to samples taken after fasting for at least 8 hours.   BUN 02/07/2022 10  6 - 20 mg/dL Final   Creatinine, Ser 02/07/2022 0.88  0.44 - 1.00 mg/dL Final   Calcium 20/94/7096 9.4  8.9 - 10.3 mg/dL Final   Total Protein 28/36/6294 7.2  6.5 - 8.1 g/dL Final   Albumin 76/54/6503 3.9  3.5 - 5.0 g/dL Final   AST 54/65/6812 18  15 - 41 U/L Final   ALT 02/07/2022 25  0 - 44 U/L Final   Alkaline Phosphatase 02/07/2022 66  38 - 126 U/L Final   Total Bilirubin 02/07/2022 0.2 (L)  0.3 - 1.2 mg/dL Final   GFR, Estimated 02/07/2022 >60  >60 mL/min Final   Comment: (NOTE) Calculated using the CKD-EPI Creatinine Equation (2021)    Anion gap 02/07/2022 4 (L)  5 - 15 Final   Performed at  [facility], [place] [address] [place], [place] [address]   Hgb A1c MFr Bld 02/07/2022 5.0  4.8 - 5.6 % Final   Comment: (NOTE) Pre diabetes:          5.7%-6.4%  Diabetes:              >6.4%  Glycemic control for   <7.0% adults with diabetes    Mean Plasma Glucose 02/07/2022 96.8  mg/dL Final   Performed at [facility] [address], [place], [place] [address]   Alcohol, Ethyl (B) 02/07/2022 <10  <10 mg/dL Final   Comment: (NOTE) Lowest detectable limit for serum alcohol is 10 mg/dL.  For medical purposes only. Performed at [facility], [place] [address] [place], [place] [address]    Cholesterol 02/07/2022 192  0 - 200 mg/dL Final   Triglycerides 02/07/2022 192 (H)  <150 mg/dL Final   HDL 02/07/2022 46  >40 mg/dL Final   Total CHOL/HDL Ratio 02/07/2022 4.2  RATIO Final   VLDL 02/07/2022 38  0 - 40 mg/dL Final   LDL Cholesterol 02/07/2022 108 (H)  0 - 99 mg/dL Final   Comment:        Total  Cholesterol/HDL:CHD Risk Coronary Heart Disease Risk Table                     Men   Women  1/2 Average Risk   3.4   3.3  Average Risk       5.0   4.4  2 X Average Risk   9.6   7.1  3 X Average Risk  23.4   11.0        Use the calculated Patient Ratio above and the CHD Risk Table to determine the patient's CHD Risk.        ATP III CLASSIFICATION (LDL):  <100     mg/dL   Optimal  100-129  mg/dL   Near or Above                    Optimal  130-159  mg/dL   Borderline  160-189  mg/dL   High  >190     mg/dL   Very High Performed at [facility] [address], [place], [place] [address]    TSH 02/07/2022 6.344 (H)  0.350 - 4.500 uIU/mL Final   Comment: Performed by a 3rd Generation assay with a functional sensitivity of <=0.01 uIU/mL. Performed at [facility], [place] [address], [place], [place] [address]    POC Amphetamine UR 02/07/2022 None Detected  NONE DETECTED (Cut Off Level 1000 ng/mL) Final   POC Secobarbital (BAR) 02/07/2022 None Detected  NONE DETECTED (Cut Off Level 300 ng/mL) Final   POC Buprenorphine (BUP) 02/07/2022 None Detected  NONE DETECTED (Cut Off Level 10 ng/mL) Final   POC Oxazepam (BZO) 02/07/2022 None Detected  NONE DETECTED (Cut Off Level 300 ng/mL) Final   POC Cocaine UR 02/07/2022 None Detected  NONE DETECTED (Cut Off Level 300 ng/mL) Final   POC Methamphetamine UR 02/07/2022 None Detected  NONE DETECTED (Cut Off Level 1000 ng/mL) Final   POC Morphine 02/07/2022 None Detected  NONE DETECTED (Cut Off Level 300 ng/mL) Final   POC Oxycodone UR 02/07/2022 None Detected  NONE DETECTED (Cut Off Level 100 ng/mL) Final   POC Methadone UR 02/07/2022 None Detected  NONE DETECTED (Cut Off Level 300 ng/mL) Final   POC Marijuana UR 02/07/2022  None Detected  NONE DETECTED (Cut Off Level 50 ng/mL) Final   SARSCOV2ONAVIRUS 2 AG 02/07/2022 NEGATIVE  NEGATIVE Final   Comment: (NOTE) SARS-CoV-2 antigen NOT DETECTED.   Negative results are presumptive.  Negative  results do not preclude SARS-CoV-2 infection and should not be used as the sole basis for treatment or other patient management decisions, including infection  control decisions, particularly in the presence of clinical signs and  symptoms consistent with COVID-19, or in those who have been in contact with the virus.  Negative results must be combined with clinical observations, patient history, and epidemiological information. The expected result is Negative.  Fact Sheet for Patients: HandmadeRecipes.com.cy  Fact Sheet for Healthcare Providers: FuneralLife.at  This test is not yet approved or cleared by the [place] FDA and  has been authorized for detection and/or diagnosis of SARS-CoV-2 by FDA under an Emergency Use Authorization (EUA).  This EUA will remain in effect (meaning this test can be used) for the duration of  the COV                          ID-19 declaration under Section 564(b)(1) of the Act, 21 U.S.C. section 360bbb-3(b)(1), unless the authorization is terminated or revoked sooner.     Preg Test, Ur 02/07/2022 NEGATIVE  NEGATIVE Final   Comment:        THE SENSITIVITY OF THIS METHODOLOGY IS >24 mIU/mL     Blood Alcohol level:  Lab Results  Component Value Date   ETH <10 07/04/2022   ETH <10 [id number]    Metabolic Disorder Labs: Lab Results  Component Value Date   HGBA1C 5.0 06/07/2022   MPG 96.8 06/07/2022   MPG 96.8 02/07/2022   No results found for: "PROLACTIN" Lab Results  Component Value Date   CHOL 181 07/25/2022   TRIG 118 07/25/2022   HDL 45 07/25/2022   CHOLHDL 4.0 07/25/2022   VLDL 24 07/25/2022   LDLCALC 112 (H) 07/25/2022   LDLCALC 88 06/07/2022    Therapeutic Lab Levels: No results found for: "LITHIUM" No results found for: "VALPROATE" No results found for: "CBMZ"  Physical Findings   GAD-7    [place] Office Visit from 12/17/2021 in [place]  Total GAD-7 Score 17      PHQ2-9    [place] ED from 06/07/2022 in [facility] Office Visit from 12/17/2021 in [place]  PHQ-2 Total Score 1 2  PHQ-9 Total Score 2 8      [place] ED from 07/25/2022 in [facility] ED from 07/04/2022 in [place] DEPT ED from 06/14/2022 in [facility] [place] High Risk No Risk No Risk        Musculoskeletal  Strength & Muscle Tone: within normal limits Gait & Station: normal Patient leans: N/A  Psychiatric Specialty Exam  Presentation  General Appearance:  Disheveled  Eye Contact: Fair  Speech: Clear and Coherent; Normal Rate  Speech Volume: Normal  Handedness: Right   Mood and Affect  Mood: -- (helpless)  Affect: Congruent; Appropriate   Thought Process  Thought Processes: Coherent; Goal Directed  Descriptions of Associations:Circumstantial  Orientation:Full (Time, Place and Person)  Thought Content:WDL  Diagnosis of Schizophrenia or Schizoaffective disorder in past: Yes    Hallucinations:Hallucinations: None Description of Auditory Hallucinations: Currently denying  Ideas of  Reference:None  Suicidal Thoughts:Suicidal Thoughts: No SI Active Intent and/or Plan: Without Intent  Homicidal Thoughts:Homicidal Thoughts: No (currently denying) HI Active Intent and/or Plan: -- (Currently denying) HI Passive Intent and/or Plan: -- (Currently denying)   Sensorium  Memory: Immediate Fair; Recent Fair; Remote Fair  Judgment: Fair  Insight: Fair   Materials engineer: Fair  Attention Span: Fair  Recall: [place] of Knowledge: Fair  Language: Good   Psychomotor Activity  Psychomotor Activity: Psychomotor Activity: Normal   Assets  Assets: Communication Skills; Desire for Improvement; Physical  Health   Sleep  Sleep: Sleep: Fair Number of Hours of Sleep: 8   Nutritional Assessment (For OBS and FBC admissions only) Has the patient had a weight loss or gain of 10 pounds or more in the last 3 months?: No Has the patient had a decrease in food intake/or appetite?: No Does the patient have dental problems?: No Does the patient have eating habits or behaviors that may be indicators of an eating disorder including binging or inducing vomiting?: No Has the patient recently lost weight without trying?: 0 Has the patient been eating poorly because of a decreased appetite?: 0 Malnutrition Screening Tool Score: 0    Physical Exam  Physical Exam Constitutional:      Appearance: Normal appearance.  HENT:     Head: Normocephalic.     Right Ear: Tympanic membrane normal.     Left Ear: Tympanic membrane normal.     Nose: Nose normal.  Eyes:     Extraocular Movements: Extraocular movements intact.     Pupils: Pupils are equal, round, and reactive to light.  Cardiovascular:     Rate and Rhythm: Normal rate.     Pulses: Normal pulses.  Musculoskeletal:     Cervical back: Normal range of motion.  Neurological:     General: No focal deficit present.     Mental Status: She is alert and oriented to person, place, and time.  Psychiatric:        Mood and Affect: Mood normal.        Behavior: Behavior normal.        Thought Content: Thought content normal.        Judgment: Judgment normal.    Review of Systems  Constitutional: Negative.   HENT: Negative.    Eyes: Negative.   Respiratory: Negative.    Cardiovascular: Negative.   Gastrointestinal: Negative.   Genitourinary: Negative.   Musculoskeletal: Negative.   Skin: Negative.   Neurological: Negative.   Endo/Heme/Allergies: Negative.   Psychiatric/Behavioral:  The patient is nervous/anxious.    Blood pressure 103/74, pulse (!) 101, temperature 98.4 F (36.9 C), temperature source Oral, resp. rate 18, SpO2 99 %. There is  no height or weight on file to calculate BMI.  Treatment Plan Summary: Daily contact with patient to assess and evaluate symptoms and progress in treatment  Social worker to follow up with disposition. Contacted Group home owner: Patient can not return back there.   Patient is cleared by psychiatry Ronelle Nigh, NP 07/27/2022 10:13 AM

## 2022-07-27 NOTE — ED Notes (Signed)
Pt resting quietly with eyes closed.  No pain or discomfort noted/voiced.  Breathing is even and unlabored.  Will continue to monitor for safety.  

## 2022-07-27 NOTE — Progress Notes (Signed)
Pt is asleep. Respirations are even and unlabored. No distress noted. Staff will monitor for pt's safety. 

## 2022-07-27 NOTE — ED Notes (Signed)
Pt aox4 sitting up watching tv.  She denies pain, SI, HI, and AVH.  Pt is smiling and pleasant when speaking with this Probation officer.  She requested her prn sleep aid this evening, will provide at HS. Breathing is even and unlabored will continue to monitor for safety.

## 2022-07-27 NOTE — ED Notes (Signed)
Patient alert and ate breakfast. Patient presents with flat affect but appropriate behavior and cooperative. Med compliant. Denies any pain. Denies SI,HI, and A/V/H with no plan. Patient states sleeping well. Remains safe on unit.

## 2022-07-28 MED ORDER — ONDANSETRON HCL 4 MG PO TABS
4.0000 mg | ORAL_TABLET | Freq: Once | ORAL | Status: DC
  Filled 2022-07-28: qty 1

## 2022-07-28 NOTE — ED Notes (Signed)
Pt is A & O x 4. Pt denies any pain at the moment. Pt denies SI/HI/AVH. Pt sleeping at the present. Will continue to monitor.

## 2022-07-28 NOTE — ED Provider Notes (Signed)
Patient notified attending RN that she would like to reside with her foster mother Biagio Quint.  Patient reports she spoke with her foster mother Kenney Houseman phone number [phone] and foster mother will allow her to reside in home moving forward.  Spoke with patient's father, Marcy Salvo, who reports this plan has not been confirmed.  Patient's father shares that patient's previous foster mother is currently considering having patient return to her home but has not yet reached a final decision.  Patient's father spoke with foster mother earlier this date.  Patient's father will update [facility] behavioral health staff as soon as patient's foster mother has reached a decision.

## 2022-07-28 NOTE — ED Notes (Signed)
Pt resting quietly with eyes closed.  No pain or discomfort noted/voiced.  Breathing is even and unlabored.  Will continue to monitor for safety.  

## 2022-07-28 NOTE — ED Provider Notes (Signed)
Behavioral Health Progress Note  Date and Time: 07/28/2022 8:29 AM Name: Nicole [name] MRN:  [id number]  Subjective: Patient states "I am ready to live on my own, every time I go to a group home I just take care of all the other clients."  Nicole [name] is reassessed, face-to-face, by nurse practitioner.  She is seated in observation area upon my approach, no apparent distress.  She is alert and oriented, pleasant and cooperative during assessment.  She presents with euthymic mood, congruent affect.   She reports readiness to discharge home however she verbalizes understanding of current treatment plan to include seeking alternative placement as most recent group home will not permit her to return.  Nicole [name] continues to deny suicidal and homicidal ideations.  She easily contracts verbally for safety with this Probation officer.  She denies auditory and visual hallucinations.  There is no evidence of delusional thought content and no indication that patient is responding to internal stimuli.  She remains pleasant and cooperative with care.  She is compliant with medication.  Patient requests that I reach out to her father to discuss treatment plan.  Patient offered support and encouragement.  She verbalizes understanding of current treatment plan.  Spoke with patient's father, Nicole [name] [phone]. He is actively seeking placement for Nicole [name]. He will not permit paitent to return to his home related to history of aggressive behavior. He has spoken briefly with intake coordinator at [facility] located in [place].  He shares that he will continue to assist with confirming care coordination and seeking safe discharge plan for Nicole [name].  Diagnosis:  Final diagnoses:  Severe episode of recurrent major depressive disorder, with psychotic features ([place])    Total Time spent with patient: 20 minutes  Past Psychiatric History: schizophrenia, schizoaffective disorder, MDD, suicidal ideation Past  Medical History:  Past Medical History:  Diagnosis Date   Anxiety    Depression     Past Surgical History:  Procedure Laterality Date   WISDOM TOOTH EXTRACTION Bilateral 2020   Family History:  Family History  Problem Relation Age of Onset   Hypertension Father    Diabetes Father    Family Psychiatric  History: none reported Social History:  Social History   Substance and Sexual Activity  Alcohol Use Never     Social History   Substance and Sexual Activity  Drug Use Never    Social History   Socioeconomic History   Marital status: Single    Spouse name: Not on file   Number of children: Not on file   Years of education: Not on file   Highest education level: Not on file  Occupational History   Not on file  Tobacco Use   Smoking status: Every Day    Types: Cigarettes   Smokeless tobacco: Never  Vaping Use   Vaping Use: Some days  Substance and Sexual Activity   Alcohol use: Never   Drug use: Never   Sexual activity: Yes    Partners: Female    Birth control/protection: Implant  Other Topics Concern   Not on file  Social History Narrative   Not on file   Social Determinants of Health   Financial Resource Strain: Not on file  Food Insecurity: Not on file  Transportation Needs: Not on file  Physical Activity: Not on file  Stress: Not on file  Social Connections: Not on file   SDOH:  SDOH Screenings   Depression (PHQ2-9): Low Risk  (06/10/2022)  Tobacco Use: High Risk (07/04/2022)  Additional Social History:    Pain Medications: SEE MAR Prescriptions: SEE MAR Over the Counter: SEE MAR History of alcohol / drug use?: Yes Longest period of sobriety (when/how long): N/A Negative Consequences of Use:  (NONE) Withdrawal Symptoms: None Name of Substance 1: ETOH IN PAST--PT CURRENTLY DENIES 1 - Frequency: SOCIAL USE ETOH IN PAST 1 - Method of Aquiring: LEGAL 1- Route of Use: ORAL DRINK Name of Substance 2: [place] 2 - Amount  (size/oz): LESS THAN ONE PACK 2 - Frequency: DAILY 2 - Method of Aquiring: LEGAL 2 - Route of Substance Use: ORAL SMOKE                Sleep: Good  Appetite:  Good  Current Medications:  Current Facility-Administered Medications  Medication Dose Route Frequency Provider Last Rate Last Admin   acetaminophen (TYLENOL) tablet 650 mg  650 mg Oral Q6H PRN Onuoha, Chinwendu V, NP   650 mg at 07/26/22 2134   alum & mag hydroxide-simeth (MAALOX/MYLANTA) 200-200-20 MG/5ML suspension 30 mL  30 mL Oral Q4H PRN Onuoha, Chinwendu V, NP   30 mL at 07/27/22 2151   hydrOXYzine (ATARAX) tablet 25 mg  25 mg Oral TID PRN Onuoha, Chinwendu V, NP   25 mg at 07/25/22 2135   magnesium hydroxide (MILK OF MAGNESIA) suspension 30 mL  30 mL Oral Daily PRN Onuoha, Chinwendu V, NP       nicotine (NICODERM CQ - dosed in mg/24 hours) patch 14 mg  14 mg Transdermal Q0600 Onuoha, Chinwendu V, NP   14 mg at 07/28/22 0603   ondansetron (ZOFRAN) tablet 4 mg  4 mg Oral Once Nwoko, Uchenna E, PA       Oxcarbazepine (TRILEPTAL) tablet 300 mg  300 mg Oral BID Onuoha, Chinwendu V, NP   300 mg at 07/27/22 2112   QUEtiapine (SEROQUEL) tablet 400 mg  400 mg Oral BID Onuoha, Chinwendu V, NP   400 mg at 07/27/22 2112   sertraline (ZOLOFT) tablet 150 mg  150 mg Oral Daily Onuoha, Chinwendu V, NP   150 mg at 07/27/22 0953   traZODone (DESYREL) tablet 50 mg  50 mg Oral QHS PRN Onuoha, Chinwendu V, NP   50 mg at 07/27/22 2112   Current Outpatient Medications  Medication Sig Dispense Refill   ABILIFY MAINTENA 400 MG PRSY prefilled syringe 400 mg every 28 (twenty-eight) days.     cetirizine (ZYRTEC) 10 MG tablet Take 10 mg by mouth daily.     cyclobenzaprine (FLEXERIL) 10 MG tablet Take 1 tablet (10 mg total) by mouth 2 (two) times daily as needed for muscle spasms. 20 tablet 0   fluticasone (FLONASE) 50 MCG/ACT nasal spray Place 1 spray into both nostrils daily.     hydrOXYzine (ATARAX) 50 MG tablet Take 1 tablet (50 mg total) by  mouth 2 (two) times daily. 60 tablet 0   meloxicam (MOBIC) 15 MG tablet Take 15 mg by mouth daily.     Oxcarbazepine (TRILEPTAL) 300 MG tablet Take 1 tablet (300 mg total) by mouth 2 (two) times daily. 60 tablet 0   QUEtiapine (SEROQUEL) 400 MG tablet Take 1 tablet (400 mg total) by mouth 2 (two) times daily. 60 tablet 0   sertraline (ZOLOFT) 50 MG tablet Take 3 tablets (150 mg total) by mouth in the morning. 90 tablet 0   traZODone (DESYREL) 100 MG tablet Take 1 tablet (100 mg total) by mouth at bedtime. 30 tablet 0   valACYclovir (VALTREX) 500 MG  tablet Take 500 mg by mouth daily.      Labs  Lab Results:  Admission on 07/25/2022  Component Date Value Ref Range Status   SARS Coronavirus 2 by RT PCR 07/25/2022 NEGATIVE  NEGATIVE Final   Comment: (NOTE) SARS-CoV-2 target nucleic acids are NOT DETECTED.  The SARS-CoV-2 RNA is generally detectable in upper respiratory specimens during the acute phase of infection. The lowest concentration of SARS-CoV-2 viral copies this assay can detect is 138 copies/mL. A negative result does not preclude SARS-Cov-2 infection and should not be used as the sole basis for treatment or other patient management decisions. A negative result may occur with  improper specimen collection/handling, submission of specimen other than nasopharyngeal swab, presence of viral mutation(s) within the areas targeted by this assay, and inadequate number of viral copies(<138 copies/mL). A negative result must be combined with clinical observations, patient history, and epidemiological information. The expected result is Negative.  Fact Sheet for Patients:  EntrepreneurPulse.com.au  Fact Sheet for Healthcare Providers:  IncredibleEmployment.be  This test is no                          t yet approved or cleared by the [place] FDA and  has been authorized for detection and/or diagnosis of SARS-CoV-2 by FDA under an Emergency Use  Authorization (EUA). This EUA will remain  in effect (meaning this test can be used) for the duration of the COVID-19 declaration under Section 564(b)(1) of the Act, 21 U.S.C.section 360bbb-3(b)(1), unless the authorization is terminated  or revoked sooner.       Influenza A by PCR 07/25/2022 NEGATIVE  NEGATIVE Final   Influenza B by PCR 07/25/2022 NEGATIVE  NEGATIVE Final   Comment: (NOTE) The Xpert Xpress SARS-CoV-2/FLU/RSV plus assay is intended as an aid in the diagnosis of influenza from Nasopharyngeal swab specimens and should not be used as a sole basis for treatment. Nasal washings and aspirates are unacceptable for Xpert Xpress SARS-CoV-2/FLU/RSV testing.  Fact Sheet for Patients: EntrepreneurPulse.com.au  Fact Sheet for Healthcare Providers: IncredibleEmployment.be  This test is not yet approved or cleared by the [place] FDA and has been authorized for detection and/or diagnosis of SARS-CoV-2 by FDA under an Emergency Use Authorization (EUA). This EUA will remain in effect (meaning this test can be used) for the duration of the COVID-19 declaration under Section 564(b)(1) of the Act, 21 U.S.C. section 360bbb-3(b)(1), unless the authorization is terminated or revoked.  Performed at [facility], [place] [address] [place], [place] [address]    WBC 07/25/2022 8.3  4.0 - 10.5 K/uL Final   RBC 07/25/2022 4.44  3.87 - 5.11 MIL/uL Final   Hemoglobin 07/25/2022 13.7  12.0 - 15.0 g/dL Final   HCT 07/25/2022 40.2  36.0 - 46.0 % Final   MCV 07/25/2022 90.5  80.0 - 100.0 fL Final   MCH 07/25/2022 30.9  26.0 - 34.0 pg Final   MCHC 07/25/2022 34.1  30.0 - 36.0 g/dL Final   RDW 07/25/2022 12.2  11.5 - 15.5 % Final   Platelets 07/25/2022 248  150 - 400 K/uL Final   nRBC 07/25/2022 0.0  0.0 - 0.2 % Final   Neutrophils Relative % 07/25/2022 43  % Final   Neutro Abs 07/25/2022 3.6  1.7 - 7.7 K/uL Final   Lymphocytes Relative 07/25/2022  52  % Final   Lymphs Abs 07/25/2022 4.2 (H)  0.7 - 4.0 K/uL Final   Monocytes Relative  07/25/2022 5  % Final   Monocytes Absolute 07/25/2022 0.4  0.1 - 1.0 K/uL Final   Eosinophils Relative 07/25/2022 0  % Final   Eosinophils Absolute 07/25/2022 0.0  0.0 - 0.5 K/uL Final   Basophils Relative 07/25/2022 0  % Final   Basophils Absolute 07/25/2022 0.0  0.0 - 0.1 K/uL Final   Immature Granulocytes 07/25/2022 0  % Final   Abs Immature Granulocytes 07/25/2022 0.02  0.00 - 0.07 K/uL Final   Performed at [facility], [place] [address], [place], [place] [address]   Sodium 07/25/2022 138  135 - 145 mmol/L Final   Potassium 07/25/2022 4.0  3.5 - 5.1 mmol/L Final   Chloride 07/25/2022 104  98 - 111 mmol/L Final   CO2 07/25/2022 29  22 - 32 mmol/L Final   Glucose, Bld 07/25/2022 83  70 - 99 mg/dL Final   Glucose reference range applies only to samples taken after fasting for at least 8 hours.   BUN 07/25/2022 11  6 - 20 mg/dL Final   Creatinine, Ser 07/25/2022 0.97  0.44 - 1.00 mg/dL Final   Calcium 07/25/2022 9.2  8.9 - 10.3 mg/dL Final   Total Protein 07/25/2022 7.0  6.5 - 8.1 g/dL Final   Albumin 07/25/2022 3.8  3.5 - 5.0 g/dL Final   AST 07/25/2022 18  15 - 41 U/L Final   ALT 07/25/2022 22  0 - 44 U/L Final   Alkaline Phosphatase 07/25/2022 64  38 - 126 U/L Final   Total Bilirubin 07/25/2022 0.2 (L)  0.3 - 1.2 mg/dL Final   GFR, Estimated 07/25/2022 >60  >60 mL/min Final   Comment: (NOTE) Calculated using the CKD-EPI Creatinine Equation (2021)    Anion gap 07/25/2022 5  5 - 15 Final   Performed at [facility] [address] [place], [place] [address]   POC Amphetamine UR 07/25/2022 None Detected  NONE DETECTED (Cut Off Level 1000 ng/mL) Preliminary   POC Secobarbital (BAR) 07/25/2022 None Detected  NONE DETECTED (Cut Off Level 300 ng/mL) Preliminary   POC Buprenorphine (BUP) 07/25/2022 None Detected  NONE DETECTED (Cut Off Level 10 ng/mL) Preliminary   POC Oxazepam (BZO)  07/25/2022 None Detected  NONE DETECTED (Cut Off Level 300 ng/mL) Preliminary   POC Cocaine UR 07/25/2022 None Detected  NONE DETECTED (Cut Off Level 300 ng/mL) Preliminary   POC Methamphetamine UR 07/25/2022 None Detected  NONE DETECTED (Cut Off Level 1000 ng/mL) Preliminary   POC Morphine 07/25/2022 None Detected  NONE DETECTED (Cut Off Level 300 ng/mL) Preliminary   POC Methadone UR 07/25/2022 None Detected  NONE DETECTED (Cut Off Level 300 ng/mL) Preliminary   POC Oxycodone UR 07/25/2022 None Detected  NONE DETECTED (Cut Off Level 100 ng/mL) Preliminary   POC Marijuana UR 07/25/2022 None Detected  NONE DETECTED (Cut Off Level 50 ng/mL) Preliminary   SARSCOV2ONAVIRUS 2 AG 07/25/2022 NEGATIVE  NEGATIVE Final   Comment: (NOTE) SARS-CoV-2 antigen NOT DETECTED.   Negative results are presumptive.  Negative results do not preclude SARS-CoV-2 infection and should not be used as the sole basis for treatment or other patient management decisions, including infection  control decisions, particularly in the presence of clinical signs and  symptoms consistent with COVID-19, or in those who have been in contact with the virus.  Negative results must be combined with clinical observations, patient history, and epidemiological information. The expected result is Negative.  Fact Sheet for Patients: HandmadeRecipes.com.cy  Fact Sheet for Healthcare Providers: FuneralLife.at  This test is not yet approved or cleared by the [place] and  has been authorized for detection and/or diagnosis of SARS-CoV-2 by FDA under an Emergency Use Authorization (EUA).  This EUA will remain in effect (meaning this test can be used) for the duration of  the COV                          ID-19 declaration under Section 564(b)(1) of the Act, 21 U.S.C. section 360bbb-3(b)(1), unless the authorization is terminated or revoked sooner.     Cholesterol 07/25/2022 181   0 - 200 mg/dL Final   Triglycerides 07/25/2022 118  <150 mg/dL Final   HDL 07/25/2022 45  >40 mg/dL Final   Total CHOL/HDL Ratio 07/25/2022 4.0  RATIO Final   VLDL 07/25/2022 24  0 - 40 mg/dL Final   LDL Cholesterol 07/25/2022 112 (H)  0 - 99 mg/dL Final   Comment:        Total Cholesterol/HDL:CHD Risk Coronary Heart Disease Risk Table                     Men   Women  1/2 Average Risk   3.4   3.3  Average Risk       5.0   4.4  2 X Average Risk   9.6   7.1  3 X Average Risk  23.4   11.0        Use the calculated Patient Ratio above and the CHD Risk Table to determine the patient's CHD Risk.        ATP III CLASSIFICATION (LDL):  <100     mg/dL   Optimal  100-129  mg/dL   Near or Above                    Optimal  130-159  mg/dL   Borderline  160-189  mg/dL   High  >190     mg/dL   Very High Performed at [facility] [address] [place], [place] [address]    TSH 07/25/2022 6.668 (H)  0.350 - 4.500 uIU/mL Final   Comment: Performed by a 3rd Generation assay with a functional sensitivity of <=0.01 uIU/mL. Performed at [facility], [place] [address] [place],  [address]    Glucose-Capillary 07/26/2022 104 (H)  70 - 99 mg/dL Final   Glucose reference range applies only to samples taken after fasting for at least 8 hours.  Admission on 07/04/2022, Discharged on 07/05/2022  Component Date Value Ref Range Status   Sodium 07/04/2022 142  135 - 145 mmol/L Final   Potassium 07/04/2022 4.4  3.5 - 5.1 mmol/L Final   Chloride 07/04/2022 109  98 - 111 mmol/L Final   CO2 07/04/2022 26  22 - 32 mmol/L Final   Glucose, Bld 07/04/2022 96  70 - 99 mg/dL Final   Glucose reference range applies only to samples taken after fasting for at least 8 hours.   BUN 07/04/2022 15  6 - 20 mg/dL Final   Creatinine, Ser 07/04/2022 0.83  0.44 - 1.00 mg/dL Final   Calcium 07/04/2022 9.3  8.9 - 10.3 mg/dL Final   Total Protein 07/04/2022 7.2  6.5 - 8.1 g/dL Final   Albumin 07/04/2022 3.9   3.5 - 5.0 g/dL Final   AST 07/04/2022 23  15 - 41 U/L Final   ALT 07/04/2022 28  0 - 44 U/L Final  Alkaline Phosphatase 07/04/2022 77  38 - 126 U/L Final   Total Bilirubin 07/04/2022 0.4  0.3 - 1.2 mg/dL Final   GFR, Estimated 07/04/2022 >60  >60 mL/min Final   Comment: (NOTE) Calculated using the CKD-EPI Creatinine Equation (2021)    Anion gap 07/04/2022 7  5 - 15 Final   Performed at [facility], [place] [address] [place], [place] [address]   Alcohol, Ethyl (B) 07/04/2022 <10  <10 mg/dL Final   Comment: (NOTE) Lowest detectable limit for serum alcohol is 10 mg/dL.  For medical purposes only. Performed at [facility], [place] [address] [place], [place] [address]    WBC 07/04/2022 7.0  4.0 - 10.5 K/uL Final   RBC 07/04/2022 4.18  3.87 - 5.11 MIL/uL Final   Hemoglobin 07/04/2022 12.8  12.0 - 15.0 g/dL Final   HCT 07/04/2022 39.1  36.0 - 46.0 % Final   MCV 07/04/2022 93.5  80.0 - 100.0 fL Final   MCH 07/04/2022 30.6  26.0 - 34.0 pg Final   MCHC 07/04/2022 32.7  30.0 - 36.0 g/dL Final   RDW 07/04/2022 12.9  11.5 - 15.5 % Final   Platelets 07/04/2022 243  150 - 400 K/uL Final   nRBC 07/04/2022 0.0  0.0 - 0.2 % Final   Neutrophils Relative % 07/04/2022 43  % Final   Neutro Abs 07/04/2022 3.0  1.7 - 7.7 K/uL Final   Lymphocytes Relative 07/04/2022 50  % Final   Lymphs Abs 07/04/2022 3.5  0.7 - 4.0 K/uL Final   Monocytes Relative 07/04/2022 7  % Final   Monocytes Absolute 07/04/2022 0.5  0.1 - 1.0 K/uL Final   Eosinophils Relative 07/04/2022 0  % Final   Eosinophils Absolute 07/04/2022 0.0  0.0 - 0.5 K/uL Final   Basophils Relative 07/04/2022 0  % Final   Basophils Absolute 07/04/2022 0.0  0.0 - 0.1 K/uL Final   Immature Granulocytes 07/04/2022 0  % Final   Abs Immature Granulocytes 07/04/2022 0.01  0.00 - 0.07 K/uL Final   Performed at [facility], [place] [address] [place], [place] [address]   I-stat hCG, quantitative  07/04/2022 <5.0  <5 mIU/mL Final   Comment 3 07/04/2022          Final   Comment:   GEST. AGE      CONC.  (mIU/mL)   <=1 WEEK        5 - 50     2 WEEKS       50 - 500     3 WEEKS       100 - 10,000     4 WEEKS     1,000 - 30,000        FEMALE AND NON-PREGNANT FEMALE:     LESS THAN 5 mIU/mL   Admission on 06/14/2022, Discharged on 06/14/2022  Component Date Value Ref Range Status   Color, UA 06/14/2022 yellow  yellow Final   Clarity, UA 06/14/2022 cloudy (A)  clear Final   Glucose, UA 06/14/2022 negative  negative mg/dL Final   Bilirubin, UA 06/14/2022 negative  negative Final   Ketones, POC UA 06/14/2022 negative  negative mg/dL Final   Spec Grav, UA 06/14/2022 1.025  1.010 - 1.025 Final   Blood, UA 06/14/2022 negative  negative Final   pH, UA 06/14/2022 7.5  5.0 - 8.0 Final   Protein Ur, POC 06/14/2022 =30 (A)  negative mg/dL Final   Urobilinogen, UA 06/14/2022 0.2  0.2  or 1.0 E.U./dL Final   Nitrite, UA 06/14/2022 Negative  Negative Final   Leukocytes, UA 06/14/2022 Negative  Negative Final   Preg Test, Ur 06/14/2022 Negative  Negative Final   Specimen Description 06/14/2022 URINE, CLEAN CATCH   Final   Special Requests 06/14/2022    Final                   Value:NONE Performed at [facility], [place] [address] [place], [place] [address]    Culture 06/14/2022 MULTIPLE SPECIES PRESENT, SUGGEST RECOLLECTION (A)   Final   Report Status 06/14/2022 06/16/2022 FINAL   Final  Admission on 06/07/2022, Discharged on 06/10/2022  Component Date Value Ref Range Status   SARS Coronavirus 2 by RT PCR 06/07/2022 NEGATIVE  NEGATIVE Final   Comment: (NOTE) SARS-CoV-2 target nucleic acids are NOT DETECTED.  The SARS-CoV-2 RNA is generally detectable in upper respiratory specimens during the acute phase of infection. The lowest concentration of SARS-CoV-2 viral copies this assay can detect is 138 copies/mL. A negative result does not preclude SARS-Cov-2 infection and should not be used  as the sole basis for treatment or other patient management decisions. A negative result may occur with  improper specimen collection/handling, submission of specimen other than nasopharyngeal swab, presence of viral mutation(s) within the areas targeted by this assay, and inadequate number of viral copies(<138 copies/mL). A negative result must be combined with clinical observations, patient history, and epidemiological information. The expected result is Negative.  Fact Sheet for Patients:  EntrepreneurPulse.com.au  Fact Sheet for Healthcare Providers:  IncredibleEmployment.be  This test is no                          t yet approved or cleared by the [place] FDA and  has been authorized for detection and/or diagnosis of SARS-CoV-2 by FDA under an Emergency Use Authorization (EUA). This EUA will remain  in effect (meaning this test can be used) for the duration of the COVID-19 declaration under Section 564(b)(1) of the Act, 21 U.S.C.section 360bbb-3(b)(1), unless the authorization is terminated  or revoked sooner.       Influenza A by PCR 06/07/2022 NEGATIVE  NEGATIVE Final   Influenza B by PCR 06/07/2022 NEGATIVE  NEGATIVE Final   Comment: (NOTE) The Xpert Xpress SARS-CoV-2/FLU/RSV plus assay is intended as an aid in the diagnosis of influenza from Nasopharyngeal swab specimens and should not be used as a sole basis for treatment. Nasal washings and aspirates are unacceptable for Xpert Xpress SARS-CoV-2/FLU/RSV testing.  Fact Sheet for Patients: EntrepreneurPulse.com.au  Fact Sheet for Healthcare Providers: IncredibleEmployment.be  This test is not yet approved or cleared by the [place] FDA and has been authorized for detection and/or diagnosis of SARS-CoV-2 by FDA under an Emergency Use Authorization (EUA). This EUA will remain in effect (meaning this test can be used) for the duration of  the COVID-19 declaration under Section 564(b)(1) of the Act, 21 U.S.C. section 360bbb-3(b)(1), unless the authorization is terminated or revoked.  Performed at [facility], [place] [address], [place], [place] [address]    WBC 06/07/2022 5.7  4.0 - 10.5 K/uL Final   RBC 06/07/2022 4.39  3.87 - 5.11 MIL/uL Final   Hemoglobin 06/07/2022 13.2  12.0 - 15.0 g/dL Final   HCT 06/07/2022 40.4  36.0 - 46.0 % Final   MCV 06/07/2022 92.0  80.0 - 100.0 fL Final   MCH 06/07/2022 30.1  26.0 -  34.0 pg Final   MCHC 06/07/2022 32.7  30.0 - 36.0 g/dL Final   RDW 06/07/2022 12.4  11.5 - 15.5 % Final   Platelets 06/07/2022 308  150 - 400 K/uL Final   nRBC 06/07/2022 0.0  0.0 - 0.2 % Final   Neutrophils Relative % 06/07/2022 42  % Final   Neutro Abs 06/07/2022 2.4  1.7 - 7.7 K/uL Final   Lymphocytes Relative 06/07/2022 54  % Final   Lymphs Abs 06/07/2022 3.1  0.7 - 4.0 K/uL Final   Monocytes Relative 06/07/2022 4  % Final   Monocytes Absolute 06/07/2022 0.2  0.1 - 1.0 K/uL Final   Eosinophils Relative 06/07/2022 0  % Final   Eosinophils Absolute 06/07/2022 0.0  0.0 - 0.5 K/uL Final   Basophils Relative 06/07/2022 0  % Final   Basophils Absolute 06/07/2022 0.0  0.0 - 0.1 K/uL Final   Immature Granulocytes 06/07/2022 0  % Final   Abs Immature Granulocytes 06/07/2022 0.01  0.00 - 0.07 K/uL Final   Performed at [facility], [place] [address], [place], [place] [address]   Sodium 06/07/2022 139  135 - 145 mmol/L Final   Potassium 06/07/2022 4.0  3.5 - 5.1 mmol/L Final   Chloride 06/07/2022 104  98 - 111 mmol/L Final   CO2 06/07/2022 28  22 - 32 mmol/L Final   Glucose, Bld 06/07/2022 104 (H)  70 - 99 mg/dL Final   Glucose reference range applies only to samples taken after fasting for at least 8 hours.   BUN 06/07/2022 8  6 - 20 mg/dL Final   Creatinine, Ser 06/07/2022 0.84  0.44 - 1.00 mg/dL Final   Calcium 06/07/2022 9.1  8.9 - 10.3 mg/dL Final   Total Protein 06/07/2022 6.9  6.5 - 8.1 g/dL  Final   Albumin 06/07/2022 3.7  3.5 - 5.0 g/dL Final   AST 06/07/2022 19  15 - 41 U/L Final   ALT 06/07/2022 22  0 - 44 U/L Final   Alkaline Phosphatase 06/07/2022 54  38 - 126 U/L Final   Total Bilirubin 06/07/2022 0.4  0.3 - 1.2 mg/dL Final   GFR, Estimated 06/07/2022 >60  >60 mL/min Final   Comment: (NOTE) Calculated using the CKD-EPI Creatinine Equation (2021)    Anion gap 06/07/2022 7  5 - 15 Final   Performed at [facility] [address] [place], [place] [address]   Hgb A1c MFr Bld 06/07/2022 5.0  4.8 - 5.6 % Final   Comment: (NOTE) Pre diabetes:          5.7%-6.4%  Diabetes:              >6.4%  Glycemic control for   <7.0% adults with diabetes    Mean Plasma Glucose 06/07/2022 96.8  mg/dL Final   Performed at [facility], [place] [address] [place], [place] [address]   TSH 06/07/2022 1.620  0.350 - 4.500 uIU/mL Final   Comment: Performed by a 3rd Generation assay with a functional sensitivity of <=0.01 uIU/mL. Performed at [facility], [place] [address], [place], [place] [address]    RPR Ser Ql 06/07/2022 NON REACTIVE  NON REACTIVE Final   Performed at [facility], [place] [address], [place], [place] [address]   Color, Urine 06/07/2022 YELLOW  YELLOW Final   APPearance 06/07/2022 HAZY (A)  CLEAR Final   Specific Gravity, Urine 06/07/2022 1.018  1.005 - 1.030 Final   pH 06/07/2022  7.0  5.0 - 8.0 Final   Glucose, UA 06/07/2022 NEGATIVE  NEGATIVE mg/dL Final   Hgb urine dipstick 06/07/2022 NEGATIVE  NEGATIVE Final   Bilirubin Urine 06/07/2022 NEGATIVE  NEGATIVE Final   Ketones, ur 06/07/2022 NEGATIVE  NEGATIVE mg/dL Final   Protein, ur 06/07/2022 NEGATIVE  NEGATIVE mg/dL Final   Nitrite 06/07/2022 NEGATIVE  NEGATIVE Final   Leukocytes,Ua 06/07/2022 NEGATIVE  NEGATIVE Final   Performed at [facility], [place] [address], [place], [place] [address]   Cholesterol 06/07/2022 164  0 - 200 mg/dL Final   Triglycerides 06/07/2022 158 (H)  <150 mg/dL  Final   HDL 06/07/2022 44  >40 mg/dL Final   Total CHOL/HDL Ratio 06/07/2022 3.7  RATIO Final   VLDL 06/07/2022 32  0 - 40 mg/dL Final   LDL Cholesterol 06/07/2022 88  0 - 99 mg/dL Final   Comment:        Total Cholesterol/HDL:CHD Risk Coronary Heart Disease Risk Table                     Men   Women  1/2 Average Risk   3.4   3.3  Average Risk       5.0   4.4  2 X Average Risk   9.6   7.1  3 X Average Risk  23.4   11.0        Use the calculated Patient Ratio above and the CHD Risk Table to determine the patient's CHD Risk.        ATP III CLASSIFICATION (LDL):  <100     mg/dL   Optimal  100-129  mg/dL   Near or Above                    Optimal  130-159  mg/dL   Borderline  160-189  mg/dL   High  >190     mg/dL   Very High Performed at [facility] [address], [place], [place] [address]    HIV Screen 4th Generation wRfx 06/07/2022 Non Reactive  Non Reactive Final   Performed at [facility], [place] [address] [place], [place] [address]   SARSCOV2ONAVIRUS 2 AG 06/07/2022 NEGATIVE  NEGATIVE Final   Comment: (NOTE) SARS-CoV-2 antigen NOT DETECTED.   Negative results are presumptive.  Negative results do not preclude SARS-CoV-2 infection and should not be used as the sole basis for treatment or other patient management decisions, including infection  control decisions, particularly in the presence of clinical signs and  symptoms consistent with COVID-19, or in those who have been in contact with the virus.  Negative results must be combined with clinical observations, patient history, and epidemiological information. The expected result is Negative.  Fact Sheet for Patients: HandmadeRecipes.com.cy  Fact Sheet for Healthcare Providers: FuneralLife.at  This test is not yet approved or cleared by the [place] FDA and  has been authorized for detection and/or diagnosis of SARS-CoV-2 by FDA under an Emergency Use  Authorization (EUA).  This EUA will remain in effect (meaning this test can be used) for the duration of  the COV                          ID-19 declaration under Section 564(b)(1) of the Act, 21 U.S.C. section 360bbb-3(b)(1), unless the authorization is terminated or revoked sooner.     POC Amphetamine UR 06/07/2022 None Detected  NONE DETECTED (Cut  Off Level 1000 ng/mL) Final   POC Secobarbital (BAR) 06/07/2022 None Detected  NONE DETECTED (Cut Off Level 300 ng/mL) Final   POC Buprenorphine (BUP) 06/07/2022 None Detected  NONE DETECTED (Cut Off Level 10 ng/mL) Final   POC Oxazepam (BZO) 06/07/2022 None Detected  NONE DETECTED (Cut Off Level 300 ng/mL) Final   POC Cocaine UR 06/07/2022 None Detected  NONE DETECTED (Cut Off Level 300 ng/mL) Final   POC Methamphetamine UR 06/07/2022 None Detected  NONE DETECTED (Cut Off Level 1000 ng/mL) Final   POC Morphine 06/07/2022 None Detected  NONE DETECTED (Cut Off Level 300 ng/mL) Final   POC Methadone UR 06/07/2022 None Detected  NONE DETECTED (Cut Off Level 300 ng/mL) Final   POC Oxycodone UR 06/07/2022 None Detected  NONE DETECTED (Cut Off Level 100 ng/mL) Final   POC Marijuana UR 06/07/2022 None Detected  NONE DETECTED (Cut Off Level 50 ng/mL) Final   Preg Test, Ur 06/08/2022 NEGATIVE  NEGATIVE Final   Comment:        THE SENSITIVITY OF THIS METHODOLOGY IS >20 mIU/mL. Performed at [facility], [place] [address] [place], [place] [address]    Preg Test, Ur 06/08/2022 NEGATIVE  NEGATIVE Final   Comment:        THE SENSITIVITY OF THIS METHODOLOGY IS >24 mIU/mL   Admission on 02/07/2022, Discharged on 02/08/2022  Component Date Value Ref Range Status   SARS Coronavirus 2 by RT PCR 02/07/2022 NEGATIVE  NEGATIVE Final   Comment: (NOTE) SARS-CoV-2 target nucleic acids are NOT DETECTED.  The SARS-CoV-2 RNA is generally detectable in upper respiratory specimens during the acute phase of infection. The lowest concentration of SARS-CoV-2  viral copies this assay can detect is 138 copies/mL. A negative result does not preclude SARS-Cov-2 infection and should not be used as the sole basis for treatment or other patient management decisions. A negative result may occur with  improper specimen collection/handling, submission of specimen other than nasopharyngeal swab, presence of viral mutation(s) within the areas targeted by this assay, and inadequate number of viral copies(<138 copies/mL). A negative result must be combined with clinical observations, patient history, and epidemiological information. The expected result is Negative.  Fact Sheet for Patients:  EntrepreneurPulse.com.au  Fact Sheet for Healthcare Providers:  IncredibleEmployment.be  This test is no                          t yet approved or cleared by the [place] FDA and  has been authorized for detection and/or diagnosis of SARS-CoV-2 by FDA under an Emergency Use Authorization (EUA). This EUA will remain  in effect (meaning this test can be used) for the duration of the COVID-19 declaration under Section 564(b)(1) of the Act, 21 U.S.C.section 360bbb-3(b)(1), unless the authorization is terminated  or revoked sooner.       Influenza A by PCR 02/07/2022 NEGATIVE  NEGATIVE Final   Influenza B by PCR 02/07/2022 NEGATIVE  NEGATIVE Final   Comment: (NOTE) The Xpert Xpress SARS-CoV-2/FLU/RSV plus assay is intended as an aid in the diagnosis of influenza from Nasopharyngeal swab specimens and should not be used as a sole basis for treatment. Nasal washings and aspirates are unacceptable for Xpert Xpress SARS-CoV-2/FLU/RSV testing.  Fact Sheet for Patients: EntrepreneurPulse.com.au  Fact Sheet for Healthcare Providers: IncredibleEmployment.be  This test is not yet approved or cleared by the [place] FDA and has been authorized for detection and/or diagnosis of SARS-CoV-2  by FDA  under an Emergency Use Authorization (EUA). This EUA will remain in effect (meaning this test can be used) for the duration of the COVID-19 declaration under Section 564(b)(1) of the Act, 21 U.S.C. section 360bbb-3(b)(1), unless the authorization is terminated or revoked.  Performed at [facility], [place] [address], [place], [place] [address]    WBC 02/07/2022 8.1  4.0 - 10.5 K/uL Final   RBC 02/07/2022 4.55  3.87 - 5.11 MIL/uL Final   Hemoglobin 02/07/2022 13.6  12.0 - 15.0 g/dL Final   HCT 02/07/2022 41.9  36.0 - 46.0 % Final   MCV 02/07/2022 92.1  80.0 - 100.0 fL Final   MCH 02/07/2022 29.9  26.0 - 34.0 pg Final   MCHC 02/07/2022 32.5  30.0 - 36.0 g/dL Final   RDW 02/07/2022 12.9  11.5 - 15.5 % Final   Platelets 02/07/2022 286  150 - 400 K/uL Final   nRBC 02/07/2022 0.0  0.0 - 0.2 % Final   Neutrophils Relative % 02/07/2022 41  % Final   Neutro Abs 02/07/2022 3.3  1.7 - 7.7 K/uL Final   Lymphocytes Relative 02/07/2022 53  % Final   Lymphs Abs 02/07/2022 4.3 (H)  0.7 - 4.0 K/uL Final   Monocytes Relative 02/07/2022 6  % Final   Monocytes Absolute 02/07/2022 0.5  0.1 - 1.0 K/uL Final   Eosinophils Relative 02/07/2022 0  % Final   Eosinophils Absolute 02/07/2022 0.0  0.0 - 0.5 K/uL Final   Basophils Relative 02/07/2022 0  % Final   Basophils Absolute 02/07/2022 0.0  0.0 - 0.1 K/uL Final   Immature Granulocytes 02/07/2022 0  % Final   Abs Immature Granulocytes 02/07/2022 0.02  0.00 - 0.07 K/uL Final   Performed at [facility], [place] [address] [place], [place] [address]   Sodium 02/07/2022 137  135 - 145 mmol/L Final   Potassium 02/07/2022 4.1  3.5 - 5.1 mmol/L Final   Chloride 02/07/2022 104  98 - 111 mmol/L Final   CO2 02/07/2022 29  22 - 32 mmol/L Final   Glucose, Bld 02/07/2022 92  70 - 99 mg/dL Final   Glucose reference range applies only to samples taken after fasting for at least 8 hours.   BUN 02/07/2022 10  6 - 20 mg/dL Final   Creatinine, Ser  02/07/2022 0.88  0.44 - 1.00 mg/dL Final   Calcium 02/07/2022 9.4  8.9 - 10.3 mg/dL Final   Total Protein 02/07/2022 7.2  6.5 - 8.1 g/dL Final   Albumin 02/07/2022 3.9  3.5 - 5.0 g/dL Final   AST 02/07/2022 18  15 - 41 U/L Final   ALT 02/07/2022 25  0 - 44 U/L Final   Alkaline Phosphatase 02/07/2022 66  38 - 126 U/L Final   Total Bilirubin 02/07/2022 0.2 (L)  0.3 - 1.2 mg/dL Final   GFR, Estimated 02/07/2022 >60  >60 mL/min Final   Comment: (NOTE) Calculated using the CKD-EPI Creatinine Equation (2021)    Anion gap 02/07/2022 4 (L)  5 - 15 Final   Performed at [facility] [address], [place], [place] [address]   Hgb A1c MFr Bld 02/07/2022 5.0  4.8 - 5.6 % Final   Comment: (NOTE) Pre diabetes:          5.7%-6.4%  Diabetes:              >6.4%  Glycemic control for   <7.0% adults with diabetes    Mean Plasma Glucose  02/07/2022 96.8  mg/dL Final   Performed at [facility] [address], [place], [place] [address]   Alcohol, Ethyl (B) 02/07/2022 <10  <10 mg/dL Final   Comment: (NOTE) Lowest detectable limit for serum alcohol is 10 mg/dL.  For medical purposes only. Performed at [facility], [place] [address], [place], [place] [address]    Cholesterol 02/07/2022 192  0 - 200 mg/dL Final   Triglycerides 02/07/2022 192 (H)  <150 mg/dL Final   HDL 02/07/2022 46  >40 mg/dL Final   Total CHOL/HDL Ratio 02/07/2022 4.2  RATIO Final   VLDL 02/07/2022 38  0 - 40 mg/dL Final   LDL Cholesterol 02/07/2022 108 (H)  0 - 99 mg/dL Final   Comment:        Total Cholesterol/HDL:CHD Risk Coronary Heart Disease Risk Table                     Men   Women  1/2 Average Risk   3.4   3.3  Average Risk       5.0   4.4  2 X Average Risk   9.6   7.1  3 X Average Risk  23.4   11.0        Use the calculated Patient Ratio above and the CHD Risk Table to determine the patient's CHD Risk.        ATP III CLASSIFICATION (LDL):  <100     mg/dL   Optimal  100-129  mg/dL   Near or  Above                    Optimal  130-159  mg/dL   Borderline  160-189  mg/dL   High  >190     mg/dL   Very High Performed at [facility] [address], [place], [place] [address]    TSH 02/07/2022 6.344 (H)  0.350 - 4.500 uIU/mL Final   Comment: Performed by a 3rd Generation assay with a functional sensitivity of <=0.01 uIU/mL. Performed at [facility], [place] [address] [place], [place] [address]    POC Amphetamine UR 02/07/2022 None Detected  NONE DETECTED (Cut Off Level 1000 ng/mL) Final   POC Secobarbital (BAR) 02/07/2022 None Detected  NONE DETECTED (Cut Off Level 300 ng/mL) Final   POC Buprenorphine (BUP) 02/07/2022 None Detected  NONE DETECTED (Cut Off Level 10 ng/mL) Final   POC Oxazepam (BZO) 02/07/2022 None Detected  NONE DETECTED (Cut Off Level 300 ng/mL) Final   POC Cocaine UR 02/07/2022 None Detected  NONE DETECTED (Cut Off Level 300 ng/mL) Final   POC Methamphetamine UR 02/07/2022 None Detected  NONE DETECTED (Cut Off Level 1000 ng/mL) Final   POC Morphine 02/07/2022 None Detected  NONE DETECTED (Cut Off Level 300 ng/mL) Final   POC Oxycodone UR 02/07/2022 None Detected  NONE DETECTED (Cut Off Level 100 ng/mL) Final   POC Methadone UR 02/07/2022 None Detected  NONE DETECTED (Cut Off Level 300 ng/mL) Final   POC Marijuana UR 02/07/2022 None Detected  NONE DETECTED (Cut Off Level 50 ng/mL) Final   SARSCOV2ONAVIRUS 2 AG 02/07/2022 NEGATIVE  NEGATIVE Final   Comment: (NOTE) SARS-CoV-2 antigen NOT DETECTED.   Negative results are presumptive.  Negative results do not preclude SARS-CoV-2 infection and should not be used as the sole basis for treatment or other patient management decisions, including infection  control decisions, particularly in the presence of clinical signs and  symptoms consistent with COVID-19, or in those who have been in contact with the virus.  Negative results must be combined with clinical observations, patient history, and  epidemiological information. The expected result is Negative.  Fact Sheet for Patients: HandmadeRecipes.com.cy  Fact Sheet for Healthcare Providers: FuneralLife.at  This test is not yet approved or cleared by the [place] FDA and  has been authorized for detection and/or diagnosis of SARS-CoV-2 by FDA under an Emergency Use Authorization (EUA).  This EUA will remain in effect (meaning this test can be used) for the duration of  the COV                          ID-19 declaration under Section 564(b)(1) of the Act, 21 U.S.C. section 360bbb-3(b)(1), unless the authorization is terminated or revoked sooner.     Preg Test, Ur 02/07/2022 NEGATIVE  NEGATIVE Final   Comment:        THE SENSITIVITY OF THIS METHODOLOGY IS >24 mIU/mL     Blood Alcohol level:  Lab Results  Component Value Date   ETH <10 07/04/2022   ETH <10 [id number]    Metabolic Disorder Labs: Lab Results  Component Value Date   HGBA1C 5.0 06/07/2022   MPG 96.8 06/07/2022   MPG 96.8 02/07/2022   No results found for: "PROLACTIN" Lab Results  Component Value Date   CHOL 181 07/25/2022   TRIG 118 07/25/2022   HDL 45 07/25/2022   CHOLHDL 4.0 07/25/2022   VLDL 24 07/25/2022   LDLCALC 112 (H) 07/25/2022   LDLCALC 88 06/07/2022    Therapeutic Lab Levels: No results found for: "LITHIUM" No results found for: "VALPROATE" No results found for: "CBMZ"  Physical Findings   GAD-7    Flowsheet Row Office Visit from 12/17/2021 in [facility]  Total GAD-7 Score 17      PHQ2-9    [facility] ED from 06/07/2022 in [facility] Office Visit from 12/17/2021 in [facility]  PHQ-2 Total Score 1 2  PHQ-9 Total Score 2 8      [facility] from 07/25/2022 in [facility] ED from 07/04/2022 in [facility] DEPT ED from  06/14/2022 in [facility] High Risk No Risk No Risk        Musculoskeletal  Strength & Muscle Tone: within normal limits Gait & Station: normal Patient leans: N/A  Psychiatric Specialty Exam  Presentation  General Appearance:  Casual  Eye Contact: Good  Speech: Clear and Coherent; Normal Rate  Speech Volume: Normal  Handedness: Right   Mood and Affect  Mood: Euthymic  Affect: Appropriate; Congruent   Thought Process  Thought Processes: Coherent; Goal Directed; Linear  Descriptions of Associations:Intact  Orientation:Full (Time, Place and Person)  Thought Content:Logical; WDL  Diagnosis of Schizophrenia or Schizoaffective disorder in past: Yes    Hallucinations:Hallucinations: None Description of Auditory Hallucinations: Currently denying  Ideas of Reference:None  Suicidal Thoughts:Suicidal Thoughts: No  Homicidal Thoughts:Homicidal Thoughts: No HI Active Intent and/or Plan: -- (Currently denying) HI Passive Intent and/or Plan: -- (Currently denying)   Sensorium  Memory: Immediate Good; Recent Fair  Judgment: Intact  Insight: Present   Executive Functions  Concentration: Good  Attention Span: Good  Recall: Good  Fund of Knowledge: Fair  Language: Fair   Psychomotor Activity  Psychomotor Activity: Psychomotor  Activity: Normal   Assets  Assets: Communication Skills; Desire for Improvement; Physical Health; Resilience; Social Support   Sleep  Sleep: Sleep: Good Number of Hours of Sleep: 8   Nutritional Assessment (For OBS and FBC admissions only) Has the patient had a weight loss or gain of 10 pounds or more in the last 3 months?: No Has the patient had a decrease in food intake/or appetite?: No Does the patient have dental problems?: No Does the patient have eating habits or behaviors that may be indicators of an eating disorder including binging or inducing  vomiting?: No Has the patient recently lost weight without trying?: 0 Has the patient been eating poorly because of a decreased appetite?: 0 Malnutrition Screening Tool Score: 0    Physical Exam  Physical Exam Vitals and nursing note reviewed.  Constitutional:      Appearance: Normal appearance. She is well-developed and normal weight.  HENT:     Head: Normocephalic and atraumatic.     Nose: Nose normal.  Cardiovascular:     Rate and Rhythm: Normal rate.  Pulmonary:     Effort: Pulmonary effort is normal.  Musculoskeletal:        General: Normal range of motion.     Cervical back: Normal range of motion.  Skin:    General: Skin is warm and dry.  Neurological:     Mental Status: She is alert and oriented to person, place, and time.  Psychiatric:        Attention and Perception: Attention and perception normal.        Mood and Affect: Mood and affect normal.        Speech: Speech normal.        Behavior: Behavior normal. Behavior is cooperative.        Thought Content: Thought content normal.        Cognition and Memory: Cognition and memory normal.        Judgment: Judgment normal.    Review of Systems  Constitutional: Negative.   HENT: Negative.    Eyes: Negative.   Respiratory: Negative.    Cardiovascular: Negative.   Gastrointestinal: Negative.   Genitourinary: Negative.   Musculoskeletal: Negative.   Skin: Negative.   Neurological: Negative.   Psychiatric/Behavioral: Negative.     Blood pressure 119/81, pulse 95, temperature 98.3 F (36.8 C), temperature source Oral, resp. rate 18, SpO2 98 %. There is no height or weight on file to calculate BMI.  Treatment Plan Summary: Patient reviewed with Dr Hampton Abbot. TOC team continues to seek placement for patient.  Patient remains cleared by psychiatry.  Lucky Rathke, FNP 07/28/2022 8:29 AM

## 2022-07-28 NOTE — ED Notes (Signed)
Patient cooperative on unit resting calmly in bed. Denies SI,HI, and A/V/H. No current distress.

## 2022-07-28 NOTE — ED Notes (Signed)
Pt complained of rash on right foot and on bilateral calf and requested medication for it. Hessie Diener, NP notified.

## 2022-07-28 NOTE — ED Notes (Signed)
Meal received 

## 2022-07-28 NOTE — ED Notes (Signed)
Pt woke from sleep reports nausea.  Messaged sent to provider.

## 2022-07-29 MED ORDER — CLOTRIMAZOLE 1 % EX CREA
TOPICAL_CREAM | Freq: Two times a day (BID) | CUTANEOUS | Status: DC
  Administered 2022-07-29 – 2022-09-05 (×14): 1 via TOPICAL
  Filled 2022-07-29 (×10): qty 15

## 2022-07-29 MED ORDER — VALACYCLOVIR HCL 500 MG PO TABS
500.0000 mg | ORAL_TABLET | Freq: Every day | ORAL | Status: DC
  Administered 2022-07-29 – 2022-10-07 (×71): 500 mg via ORAL
  Filled 2022-07-29 (×71): qty 1

## 2022-07-29 NOTE — ED Notes (Signed)
Patient is resting comfortably. 

## 2022-07-29 NOTE — ED Notes (Addendum)
Pt is sleeping at present. No distress noted. Will continue to monitor for safety. 

## 2022-07-29 NOTE — ED Notes (Signed)
Pt was given cereal and milk for breakfast.  

## 2022-07-29 NOTE — ED Notes (Signed)
Pt sleeping@this time. Breathing even and unlabored. Will continue to monitor for safety 

## 2022-07-29 NOTE — ED Notes (Signed)
Pt is sleeping at present. No distress noted. Will continue to monitor.  

## 2022-07-29 NOTE — ED Notes (Signed)
Pt calm and cooperative she was given supplies to take a shower. Will continue to monitor for saftey

## 2022-07-29 NOTE — ED Notes (Signed)
Pt is

## 2022-07-29 NOTE — ED Provider Notes (Signed)
Behavioral Health Progress Note  Date and Time: 07/29/2022 10:14 AM Name: Nicole [name] MRN:  [id number]   HPI: Nicole [name] is a 31 y.o. female, with PMH of SCZ unspecified, MDD with psychosis, who presented voluntary to [facility] (07/25/2022) via [facility] after a verbal altercation with [name] (not patient's legal guardian) for transient SI.  This is patient's fourth visit to [facility] for similar concerns this year. Patient has been dismissed from previous group home due to threatening SI and HI, then leaving the group home.   Legal Guardian: Mom (Maxine [place]) Point of contact: [name]) APS is following.   Subjective:  Patient was initially seen awake this a.m., eating breakfast, comfortable. Requested to be restarted on home valacyclovir, says she is basically taking it every single day.  Denied pain or active infection at this time.  She denied somatic discomforts (per ROS), questions or concerns.   VO:[id number] Thoughts: No (Contracted to safety.) SI Active Intent and/or Plan:  (Denied) KV:[id number] Thoughts: No HI Active Intent and/or Plan:  (Denied) HI Passive Intent and/or Plan:  (Denied) FIE:[id number]: None Description of Auditory Hallucinations: Denied Ideas of TKZ:SWFU   Mood: Euthymic Sleep:Good Appetite: Good   Review of Systems  Respiratory:  Negative for shortness of breath.   Cardiovascular:  Negative for chest pain.  Gastrointestinal:  Negative for nausea and vomiting.  Neurological:  Negative for dizziness and headaches.     Diagnosis:  Final diagnoses:  Severe episode of recurrent major depressive disorder, with psychotic features (HCC)    Total Time spent with patient: 20 minutes  Past Psychiatric History: schizophrenia, schizoaffective disorder, MDD, suicidal ideation Past Medical History:  Past Medical History:  Diagnosis Date   Anxiety    Depression     Past Surgical History:  Procedure Laterality Date   WISDOM TOOTH EXTRACTION  Bilateral 2020   Family History:  Family History  Problem Relation Age of Onset   Hypertension Father    Diabetes Father    Family Psychiatric  History: none reported Social History:  Social History   Substance and Sexual Activity  Alcohol Use Never     Social History   Substance and Sexual Activity  Drug Use Never    Social History   Socioeconomic History   Marital status: Single    Spouse name: Not on file   Number of children: Not on file   Years of education: Not on file   Highest education level: Not on file  Occupational History   Not on file  Tobacco Use   Smoking status: Every Day    Types: Cigarettes   Smokeless tobacco: Never  Vaping Use   Vaping Use: Some days  Substance and Sexual Activity   Alcohol use: Never   Drug use: Never   Sexual activity: Yes    Partners: Female    Birth control/protection: Implant  Other Topics Concern   Not on file  Social History Narrative   Not on file   Social Determinants of Health   Financial Resource Strain: Not on file  Food Insecurity: Not on file  Transportation Needs: Not on file  Physical Activity: Not on file  Stress: Not on file  Social Connections: Not on file   SDOH:  SDOH Screenings   Depression (PHQ2-9): Low Risk  (06/10/2022)  Tobacco Use: High Risk (07/04/2022)   Additional Social History:    Pain Medications: SEE MAR Prescriptions: SEE MAR Over the Counter: SEE MAR History of alcohol / drug use?: Yes Longest  period of sobriety (when/how long): N/A Negative Consequences of Use:  (NONE) Withdrawal Symptoms: None Name of Substance 1: ETOH IN PAST--PT CURRENTLY DENIES 1 - Frequency: SOCIAL USE ETOH IN PAST 1 - Method of Aquiring: LEGAL 1- Route of Use: ORAL DRINK Name of Substance 2: NICOTINE-VAPE AND CIGARETTES 2 - Amount (size/oz): LESS THAN ONE PACK 2 - Frequency: DAILY 2 - Method of Aquiring: LEGAL 2 - Route of Substance Use: ORAL SMOKE                Sleep:  Good  Appetite:  Good  Current Medications:  Current Facility-Administered Medications  Medication Dose Route Frequency Provider Last Rate Last Admin   acetaminophen (TYLENOL) tablet 650 mg  650 mg Oral Q6H PRN Onuoha, Chinwendu V, NP   650 mg at 07/26/22 2134   alum & mag hydroxide-simeth (MAALOX/MYLANTA) 200-200-20 MG/5ML suspension 30 mL  30 mL Oral Q4H PRN Onuoha, Chinwendu V, NP   30 mL at 07/27/22 2151   clotrimazole (LOTRIMIN) 1 % cream   Topical BID Bobbitt, Shalon E, NP   Given at 07/29/22 0847   hydrOXYzine (ATARAX) tablet 25 mg  25 mg Oral TID PRN Onuoha, Chinwendu V, NP   25 mg at 07/25/22 2135   magnesium hydroxide (MILK OF MAGNESIA) suspension 30 mL  30 mL Oral Daily PRN Onuoha, Chinwendu V, NP       nicotine (NICODERM CQ - dosed in mg/24 hours) patch 14 mg  14 mg Transdermal Q0600 Onuoha, Chinwendu V, NP   14 mg at 07/29/22 0641   ondansetron (ZOFRAN) tablet 4 mg  4 mg Oral Once Nwoko, Uchenna E, PA       Oxcarbazepine (TRILEPTAL) tablet 300 mg  300 mg Oral BID Onuoha, Chinwendu V, NP   300 mg at 07/29/22 0847   QUEtiapine (SEROQUEL) tablet 400 mg  400 mg Oral BID Onuoha, Chinwendu V, NP   400 mg at 07/28/22 2108   sertraline (ZOLOFT) tablet 150 mg  150 mg Oral Daily Onuoha, Chinwendu V, NP   150 mg at 07/29/22 0847   traZODone (DESYREL) tablet 50 mg  50 mg Oral QHS PRN Onuoha, Chinwendu V, NP   50 mg at 07/28/22 2108   Current Outpatient Medications  Medication Sig Dispense Refill   ABILIFY MAINTENA 400 MG PRSY prefilled syringe 400 mg every 28 (twenty-eight) days.     cetirizine (ZYRTEC) 10 MG tablet Take 10 mg by mouth daily.     cyclobenzaprine (FLEXERIL) 10 MG tablet Take 1 tablet (10 mg total) by mouth 2 (two) times daily as needed for muscle spasms. 20 tablet 0   fluticasone (FLONASE) 50 MCG/ACT nasal spray Place 1 spray into both nostrils daily.     hydrOXYzine (ATARAX) 50 MG tablet Take 1 tablet (50 mg total) by mouth 2 (two) times daily. 60 tablet 0   meloxicam  (MOBIC) 15 MG tablet Take 15 mg by mouth daily.     Oxcarbazepine (TRILEPTAL) 300 MG tablet Take 1 tablet (300 mg total) by mouth 2 (two) times daily. 60 tablet 0   QUEtiapine (SEROQUEL) 400 MG tablet Take 1 tablet (400 mg total) by mouth 2 (two) times daily. 60 tablet 0   sertraline (ZOLOFT) 50 MG tablet Take 3 tablets (150 mg total) by mouth in the morning. 90 tablet 0   traZODone (DESYREL) 100 MG tablet Take 1 tablet (100 mg total) by mouth at bedtime. 30 tablet 0   valACYclovir (VALTREX) 500 MG tablet Take 500 mg by  mouth daily.      Labs  Lab Results:  Admission on 07/25/2022  Component Date Value Ref Range Status   SARS Coronavirus 2 by RT PCR 07/25/2022 NEGATIVE  NEGATIVE Final   Comment: (NOTE) SARS-CoV-2 target nucleic acids are NOT DETECTED.  The SARS-CoV-2 RNA is generally detectable in upper respiratory specimens during the acute phase of infection. The lowest concentration of SARS-CoV-2 viral copies this assay can detect is 138 copies/mL. A negative result does not preclude SARS-Cov-2 infection and should not be used as the sole basis for treatment or other patient management decisions. A negative result may occur with  improper specimen collection/handling, submission of specimen other than nasopharyngeal swab, presence of viral mutation(s) within the areas targeted by this assay, and inadequate number of viral copies(<138 copies/mL). A negative result must be combined with clinical observations, patient history, and epidemiological information. The expected result is Negative.  Fact Sheet for Patients:  BloggerCourse.com  Fact Sheet for Healthcare Providers:  SeriousBroker.it  This test is no                          t yet approved or cleared by the [place] FDA and  has been authorized for detection and/or diagnosis of SARS-CoV-2 by FDA under an Emergency Use Authorization (EUA). This EUA will remain  in  effect (meaning this test can be used) for the duration of the COVID-19 declaration under Section 564(b)(1) of the Act, 21 U.S.C.section 360bbb-3(b)(1), unless the authorization is terminated  or revoked sooner.       Influenza A by PCR 07/25/2022 NEGATIVE  NEGATIVE Final   Influenza B by PCR 07/25/2022 NEGATIVE  NEGATIVE Final   Comment: (NOTE) The Xpert Xpress SARS-CoV-2/FLU/RSV plus assay is intended as an aid in the diagnosis of influenza from Nasopharyngeal swab specimens and should not be used as a sole basis for treatment. Nasal washings and aspirates are unacceptable for Xpert Xpress SARS-CoV-2/FLU/RSV testing.  Fact Sheet for Patients: BloggerCourse.com  Fact Sheet for Healthcare Providers: SeriousBroker.it  This test is not yet approved or cleared by the [place] FDA and has been authorized for detection and/or diagnosis of SARS-CoV-2 by FDA under an Emergency Use Authorization (EUA). This EUA will remain in effect (meaning this test can be used) for the duration of the COVID-19 declaration under Section 564(b)(1) of the Act, 21 U.S.C. section 360bbb-3(b)(1), unless the authorization is terminated or revoked.  Performed at [facility], [address], [place], [place] [address]    WBC 07/25/2022 8.3  4.0 - 10.5 K/uL Final   RBC 07/25/2022 4.44  3.87 - 5.11 MIL/uL Final   Hemoglobin 07/25/2022 13.7  12.0 - 15.0 g/dL Final   HCT 46/96/2952 40.2  36.0 - 46.0 % Final   MCV 07/25/2022 90.5  80.0 - 100.0 fL Final   MCH 07/25/2022 30.9  26.0 - 34.0 pg Final   MCHC 07/25/2022 34.1  30.0 - 36.0 g/dL Final   RDW 84/13/2440 12.2  11.5 - 15.5 % Final   Platelets 07/25/2022 248  150 - 400 K/uL Final   nRBC 07/25/2022 0.0  0.0 - 0.2 % Final   Neutrophils Relative % 07/25/2022 43  % Final   Neutro Abs 07/25/2022 3.6  1.7 - 7.7 K/uL Final   Lymphocytes Relative 07/25/2022 52  % Final   Lymphs Abs 07/25/2022 4.2 (H)   0.7 - 4.0 K/uL Final   Monocytes Relative 07/25/2022 5  % Final  Monocytes Absolute 07/25/2022 0.4  0.1 - 1.0 K/uL Final   Eosinophils Relative 07/25/2022 0  % Final   Eosinophils Absolute 07/25/2022 0.0  0.0 - 0.5 K/uL Final   Basophils Relative 07/25/2022 0  % Final   Basophils Absolute 07/25/2022 0.0  0.0 - 0.1 K/uL Final   Immature Granulocytes 07/25/2022 0  % Final   Abs Immature Granulocytes 07/25/2022 0.02  0.00 - 0.07 K/uL Final   Performed at [facility], [address], [place], [place] [address]   Sodium 07/25/2022 138  135 - 145 mmol/L Final   Potassium 07/25/2022 4.0  3.5 - 5.1 mmol/L Final   Chloride 07/25/2022 104  98 - 111 mmol/L Final   CO2 07/25/2022 29  22 - 32 mmol/L Final   Glucose, Bld 07/25/2022 83  70 - 99 mg/dL Final   Glucose reference range applies only to samples taken after fasting for at least 8 hours.   BUN 07/25/2022 11  6 - 20 mg/dL Final   Creatinine, Ser 07/25/2022 0.97  0.44 - 1.00 mg/dL Final   Calcium 73/22/0254 9.2  8.9 - 10.3 mg/dL Final   Total Protein 27/03/2375 7.0  6.5 - 8.1 g/dL Final   Albumin 28/31/5176 3.8  3.5 - 5.0 g/dL Final   AST 16/04/3709 18  15 - 41 U/L Final   ALT 07/25/2022 22  0 - 44 U/L Final   Alkaline Phosphatase 07/25/2022 64  38 - 126 U/L Final   Total Bilirubin 07/25/2022 0.2 (L)  0.3 - 1.2 mg/dL Final   GFR, Estimated 07/25/2022 >60  >60 mL/min Final   Comment: (NOTE) Calculated using the CKD-EPI Creatinine Equation (2021)    Anion gap 07/25/2022 5  5 - 15 Final   Performed at [facility], [address], [place], [place] [address]   POC Amphetamine UR 07/25/2022 None Detected  NONE DETECTED (Cut Off Level 1000 ng/mL) Preliminary   POC Secobarbital (BAR) 07/25/2022 None Detected  NONE DETECTED (Cut Off Level 300 ng/mL) Preliminary   POC Buprenorphine (BUP) 07/25/2022 None Detected  NONE DETECTED (Cut Off Level 10 ng/mL) Preliminary   POC Oxazepam (BZO) 07/25/2022 None Detected  NONE DETECTED (Cut Off  Level 300 ng/mL) Preliminary   POC Cocaine UR 07/25/2022 None Detected  NONE DETECTED (Cut Off Level 300 ng/mL) Preliminary   POC Methamphetamine UR 07/25/2022 None Detected  NONE DETECTED (Cut Off Level 1000 ng/mL) Preliminary   POC Morphine 07/25/2022 None Detected  NONE DETECTED (Cut Off Level 300 ng/mL) Preliminary   POC Methadone UR 07/25/2022 None Detected  NONE DETECTED (Cut Off Level 300 ng/mL) Preliminary   POC Oxycodone UR 07/25/2022 None Detected  NONE DETECTED (Cut Off Level 100 ng/mL) Preliminary   POC Marijuana UR 07/25/2022 None Detected  NONE DETECTED (Cut Off Level 50 ng/mL) Preliminary   SARSCOV2ONAVIRUS 2 AG 07/25/2022 NEGATIVE  NEGATIVE Final   Comment: (NOTE) SARS-CoV-2 antigen NOT DETECTED.   Negative results are presumptive.  Negative results do not preclude SARS-CoV-2 infection and should not be used as the sole basis for treatment or other patient management decisions, including infection  control decisions, particularly in the presence of clinical signs and  symptoms consistent with COVID-19, or in those who have been in contact with the virus.  Negative results must be combined with clinical observations, patient history, and epidemiological information. The expected result is Negative.  Fact Sheet for Patients: https://www.jennings-kim.com/  Fact Sheet for Healthcare Providers: https://alexander-rogers.biz/  This test is not yet approved  or cleared by the [place] and  has been authorized for detection and/or diagnosis of SARS-CoV-2 by FDA under an Emergency Use Authorization (EUA).  This EUA will remain in effect (meaning this test can be used) for the duration of  the COV                          ID-19 declaration under Section 564(b)(1) of the Act, 21 U.S.C. section 360bbb-3(b)(1), unless the authorization is terminated or revoked sooner.     Cholesterol 07/25/2022 181  0 - 200 mg/dL Final   Triglycerides 16/07/9603  118  <150 mg/dL Final   HDL 54/06/8118 45  >40 mg/dL Final   Total CHOL/HDL Ratio 07/25/2022 4.0  RATIO Final   VLDL 07/25/2022 24  0 - 40 mg/dL Final   LDL Cholesterol 07/25/2022 112 (H)  0 - 99 mg/dL Final   Comment:        Total Cholesterol/HDL:CHD Risk Coronary Heart Disease Risk Table                     Men   Women  1/2 Average Risk   3.4   3.3  Average Risk       5.0   4.4  2 X Average Risk   9.6   7.1  3 X Average Risk  23.4   11.0        Use the calculated Patient Ratio above and the CHD Risk Table to determine the patient's CHD Risk.        ATP III CLASSIFICATION (LDL):  <100     mg/dL   Optimal  147-829  mg/dL   Near or Above                    Optimal  130-159  mg/dL   Borderline  562-130  mg/dL   High  >865     mg/dL   Very High Performed at [facility], [address], [place], [place] [address]    TSH 07/25/2022 6.668 (H)  0.350 - 4.500 uIU/mL Final   Comment: Performed by a 3rd Generation assay with a functional sensitivity of <=0.01 uIU/mL. Performed at [facility], [address] [place], [place] [address]    Glucose-Capillary 07/26/2022 104 (H)  70 - 99 mg/dL Final   Glucose reference range applies only to samples taken after fasting for at least 8 hours.  Admission on 07/04/2022, Discharged on 07/05/2022  Component Date Value Ref Range Status   Sodium 07/04/2022 142  135 - 145 mmol/L Final   Potassium 07/04/2022 4.4  3.5 - 5.1 mmol/L Final   Chloride 07/04/2022 109  98 - 111 mmol/L Final   CO2 07/04/2022 26  22 - 32 mmol/L Final   Glucose, Bld 07/04/2022 96  70 - 99 mg/dL Final   Glucose reference range applies only to samples taken after fasting for at least 8 hours.   BUN 07/04/2022 15  6 - 20 mg/dL Final   Creatinine, Ser 07/04/2022 0.83  0.44 - 1.00 mg/dL Final   Calcium 84/13/2440 9.3  8.9 - 10.3 mg/dL Final   Total Protein 08/10/2535 7.2  6.5 - 8.1 g/dL Final   Albumin 64/40/3474 3.9  3.5 - 5.0 g/dL Final   AST 25/95/6387 23  15 -  41 U/L Final   ALT 07/04/2022 28  0 - 44 U/L Final   Alkaline Phosphatase 07/04/2022 77  38 - 126 U/L Final   Total Bilirubin 07/04/2022 0.4  0.3 - 1.2 mg/dL Final   GFR, Estimated 07/04/2022 >60  >60 mL/min Final   Comment: (NOTE) Calculated using the CKD-EPI Creatinine Equation (2021)    Anion gap 07/04/2022 7  5 - 15 Final   Performed at [facility], [address], [place], [place] [address]   Alcohol, Ethyl (B) 07/04/2022 <10  <10 mg/dL Final   Comment: (NOTE) Lowest detectable limit for serum alcohol is 10 mg/dL.  For medical purposes only. Performed at [facility], [address] [place], [place] [address]    WBC 07/04/2022 7.0  4.0 - 10.5 K/uL Final   RBC 07/04/2022 4.18  3.87 - 5.11 MIL/uL Final   Hemoglobin 07/04/2022 12.8  12.0 - 15.0 g/dL Final   HCT 62/13/0865 39.1  36.0 - 46.0 % Final   MCV 07/04/2022 93.5  80.0 - 100.0 fL Final   MCH 07/04/2022 30.6  26.0 - 34.0 pg Final   MCHC 07/04/2022 32.7  30.0 - 36.0 g/dL Final   RDW 78/46/9629 12.9  11.5 - 15.5 % Final   Platelets 07/04/2022 243  150 - 400 K/uL Final   nRBC 07/04/2022 0.0  0.0 - 0.2 % Final   Neutrophils Relative % 07/04/2022 43  % Final   Neutro Abs 07/04/2022 3.0  1.7 - 7.7 K/uL Final   Lymphocytes Relative 07/04/2022 50  % Final   Lymphs Abs 07/04/2022 3.5  0.7 - 4.0 K/uL Final   Monocytes Relative 07/04/2022 7  % Final   Monocytes Absolute 07/04/2022 0.5  0.1 - 1.0 K/uL Final   Eosinophils Relative 07/04/2022 0  % Final   Eosinophils Absolute 07/04/2022 0.0  0.0 - 0.5 K/uL Final   Basophils Relative 07/04/2022 0  % Final   Basophils Absolute 07/04/2022 0.0  0.0 - 0.1 K/uL Final   Immature Granulocytes 07/04/2022 0  % Final   Abs Immature Granulocytes 07/04/2022 0.01  0.00 - 0.07 K/uL Final   Performed at [facility], [address] [place], [place] [address]   I-stat hCG, quantitative 07/04/2022 <5.0  <5 mIU/mL Final   Comment 3  07/04/2022          Final   Comment:   GEST. AGE      CONC.  (mIU/mL)   <=1 WEEK        5 - 50     2 WEEKS       50 - 500     3 WEEKS       100 - 10,000     4 WEEKS     1,000 - 30,000        FEMALE AND NON-PREGNANT FEMALE:     LESS THAN 5 mIU/mL   Admission on 06/14/2022, Discharged on 06/14/2022  Component Date Value Ref Range Status   Color, UA 06/14/2022 yellow  yellow Final   Clarity, UA 06/14/2022 cloudy (A)  clear Final   Glucose, UA 06/14/2022 negative  negative mg/dL Final   Bilirubin, UA 32/44/0102 negative  negative Final   Ketones, POC UA 06/14/2022 negative  negative mg/dL Final   Spec Grav, UA 72/53/6644 1.025  1.010 - 1.025 Final   Blood, UA 06/14/2022 negative  negative Final   pH, UA 06/14/2022 7.5  5.0 - 8.0 Final   Protein Ur, POC 06/14/2022 =30 (A)  negative mg/dL Final   Urobilinogen, UA 06/14/2022 0.2  0.2 or 1.0 E.U./dL Final  Nitrite, UA 06/14/2022 Negative  Negative Final   Leukocytes, UA 06/14/2022 Negative  Negative Final   Preg Test, Ur 06/14/2022 Negative  Negative Final   Specimen Description 06/14/2022 URINE, CLEAN CATCH   Final   Special Requests 06/14/2022    Final                   Value:NONE Performed at [facility], [address] [place], [place] [address]    Culture 06/14/2022 MULTIPLE SPECIES PRESENT, SUGGEST RECOLLECTION (A)   Final   Report Status 06/14/2022 06/16/2022 FINAL   Final  Admission on 06/07/2022, Discharged on 06/10/2022  Component Date Value Ref Range Status   SARS Coronavirus 2 by RT PCR 06/07/2022 NEGATIVE  NEGATIVE Final   Comment: (NOTE) SARS-CoV-2 target nucleic acids are NOT DETECTED.  The SARS-CoV-2 RNA is generally detectable in upper respiratory specimens during the acute phase of infection. The lowest concentration of SARS-CoV-2 viral copies this assay can detect is 138 copies/mL. A negative result does not preclude SARS-Cov-2 infection and should not be used as the sole basis for treatment or other  patient management decisions. A negative result may occur with  improper specimen collection/handling, submission of specimen other than nasopharyngeal swab, presence of viral mutation(s) within the areas targeted by this assay, and inadequate number of viral copies(<138 copies/mL). A negative result must be combined with clinical observations, patient history, and epidemiological information. The expected result is Negative.  Fact Sheet for Patients:  BloggerCourse.com  Fact Sheet for Healthcare Providers:  SeriousBroker.it  This test is no                          t yet approved or cleared by the [place] FDA and  has been authorized for detection and/or diagnosis of SARS-CoV-2 by FDA under an Emergency Use Authorization (EUA). This EUA will remain  in effect (meaning this test can be used) for the duration of the COVID-19 declaration under Section 564(b)(1) of the Act, 21 U.S.C.section 360bbb-3(b)(1), unless the authorization is terminated  or revoked sooner.       Influenza A by PCR 06/07/2022 NEGATIVE  NEGATIVE Final   Influenza B by PCR 06/07/2022 NEGATIVE  NEGATIVE Final   Comment: (NOTE) The Xpert Xpress SARS-CoV-2/FLU/RSV plus assay is intended as an aid in the diagnosis of influenza from Nasopharyngeal swab specimens and should not be used as a sole basis for treatment. Nasal washings and aspirates are unacceptable for Xpert Xpress SARS-CoV-2/FLU/RSV testing.  Fact Sheet for Patients: BloggerCourse.com  Fact Sheet for Healthcare Providers: SeriousBroker.it  This test is not yet approved or cleared by the [place] FDA and has been authorized for detection and/or diagnosis of SARS-CoV-2 by FDA under an Emergency Use Authorization (EUA). This EUA will remain in effect (meaning this test can be used) for the duration of the COVID-19 declaration under Section  564(b)(1) of the Act, 21 U.S.C. section 360bbb-3(b)(1), unless the authorization is terminated or revoked.  Performed at [facility], [address], [place], [place] [address]    WBC 06/07/2022 5.7  4.0 - 10.5 K/uL Final   RBC 06/07/2022 4.39  3.87 - 5.11 MIL/uL Final   Hemoglobin 06/07/2022 13.2  12.0 - 15.0 g/dL Final   HCT 09/81/1914 40.4  36.0 - 46.0 % Final   MCV 06/07/2022 92.0  80.0 - 100.0 fL Final   MCH 06/07/2022 30.1  26.0 - 34.0 pg Final   MCHC  06/07/2022 32.7  30.0 - 36.0 g/dL Final   RDW 62/13/0865 12.4  11.5 - 15.5 % Final   Platelets 06/07/2022 308  150 - 400 K/uL Final   nRBC 06/07/2022 0.0  0.0 - 0.2 % Final   Neutrophils Relative % 06/07/2022 42  % Final   Neutro Abs 06/07/2022 2.4  1.7 - 7.7 K/uL Final   Lymphocytes Relative 06/07/2022 54  % Final   Lymphs Abs 06/07/2022 3.1  0.7 - 4.0 K/uL Final   Monocytes Relative 06/07/2022 4  % Final   Monocytes Absolute 06/07/2022 0.2  0.1 - 1.0 K/uL Final   Eosinophils Relative 06/07/2022 0  % Final   Eosinophils Absolute 06/07/2022 0.0  0.0 - 0.5 K/uL Final   Basophils Relative 06/07/2022 0  % Final   Basophils Absolute 06/07/2022 0.0  0.0 - 0.1 K/uL Final   Immature Granulocytes 06/07/2022 0  % Final   Abs Immature Granulocytes 06/07/2022 0.01  0.00 - 0.07 K/uL Final   Performed at [facility], [address] [place], [place] [address]   Sodium 06/07/2022 139  135 - 145 mmol/L Final   Potassium 06/07/2022 4.0  3.5 - 5.1 mmol/L Final   Chloride 06/07/2022 104  98 - 111 mmol/L Final   CO2 06/07/2022 28  22 - 32 mmol/L Final   Glucose, Bld 06/07/2022 104 (H)  70 - 99 mg/dL Final   Glucose reference range applies only to samples taken after fasting for at least 8 hours.   BUN 06/07/2022 8  6 - 20 mg/dL Final   Creatinine, Ser 06/07/2022 0.84  0.44 - 1.00 mg/dL Final   Calcium 62/95/2841 9.1  8.9 - 10.3 mg/dL Final   Total Protein 32/44/0102 6.9  6.5 - 8.1 g/dL Final   Albumin 72/53/6644 3.7  3.5 - 5.0  g/dL Final   AST 03/47/4259 19  15 - 41 U/L Final   ALT 06/07/2022 22  0 - 44 U/L Final   Alkaline Phosphatase 06/07/2022 54  38 - 126 U/L Final   Total Bilirubin 06/07/2022 0.4  0.3 - 1.2 mg/dL Final   GFR, Estimated 06/07/2022 >60  >60 mL/min Final   Comment: (NOTE) Calculated using the CKD-EPI Creatinine Equation (2021)    Anion gap 06/07/2022 7  5 - 15 Final   Performed at [facility], [address] [place], [place] [address]   Hgb A1c MFr Bld 06/07/2022 5.0  4.8 - 5.6 % Final   Comment: (NOTE) Pre diabetes:          5.7%-6.4%  Diabetes:              >6.4%  Glycemic control for   <7.0% adults with diabetes    Mean Plasma Glucose 06/07/2022 96.8  mg/dL Final   Performed at [facility], [address], [place], [place] [address]   TSH 06/07/2022 1.620  0.350 - 4.500 uIU/mL Final   Comment: Performed by a 3rd Generation assay with a functional sensitivity of <=0.01 uIU/mL. Performed at [facility] [facility], [address] [place], [place] [address]    RPR Ser Ql 06/07/2022 NON REACTIVE  NON REACTIVE Final   Performed at [facility], [address] [place], [place] [address]   Color, Urine 06/07/2022 YELLOW  YELLOW Final   APPearance 06/07/2022 HAZY (A)  CLEAR Final   Specific Gravity, Urine 06/07/2022 1.018  1.005 - 1.030 Final   pH 06/07/2022 7.0  5.0 - 8.0 Final  Glucose, UA 06/07/2022 NEGATIVE  NEGATIVE mg/dL Final   Hgb urine dipstick 06/07/2022 NEGATIVE  NEGATIVE Final   Bilirubin Urine 06/07/2022 NEGATIVE  NEGATIVE Final   Ketones, ur 06/07/2022 NEGATIVE  NEGATIVE mg/dL Final   Protein, ur 16/07/9603 NEGATIVE  NEGATIVE mg/dL Final   Nitrite 54/06/8118 NEGATIVE  NEGATIVE Final   Leukocytes,Ua 06/07/2022 NEGATIVE  NEGATIVE Final   Performed at [facility], [address] [place], [place] [address]   Cholesterol 06/07/2022 164  0 - 200 mg/dL Final   Triglycerides 95/62/1308 158 (H)  <150 mg/dL Final   HDL 65/78/4696 44  >40 mg/dL Final    Total CHOL/HDL Ratio 06/07/2022 3.7  RATIO Final   VLDL 06/07/2022 32  0 - 40 mg/dL Final   LDL Cholesterol 06/07/2022 88  0 - 99 mg/dL Final   Comment:        Total Cholesterol/HDL:CHD Risk Coronary Heart Disease Risk Table                     Men   Women  1/2 Average Risk   3.4   3.3  Average Risk       5.0   4.4  2 X Average Risk   9.6   7.1  3 X Average Risk  23.4   11.0        Use the calculated Patient Ratio above and the CHD Risk Table to determine the patient's CHD Risk.        ATP III CLASSIFICATION (LDL):  <100     mg/dL   Optimal  295-284  mg/dL   Near or Above                    Optimal  130-159  mg/dL   Borderline  132-440  mg/dL   High  >102     mg/dL   Very High Performed at [facility], [address] [place], [place] [address]    HIV Screen 4th Generation wRfx 06/07/2022 Non Reactive  Non Reactive Final   Performed at [facility], [address], [place], [place] [address]   SARSCOV2ONAVIRUS 2 AG 06/07/2022 NEGATIVE  NEGATIVE Final   Comment: (NOTE) SARS-CoV-2 antigen NOT DETECTED.   Negative results are presumptive.  Negative results do not preclude SARS-CoV-2 infection and should not be used as the sole basis for treatment or other patient management decisions, including infection  control decisions, particularly in the presence of clinical signs and  symptoms consistent with COVID-19, or in those who have been in contact with the virus.  Negative results must be combined with clinical observations, patient history, and epidemiological information. The expected result is Negative.  Fact Sheet for Patients: https://www.jennings-kim.com/  Fact Sheet for Healthcare Providers: https://alexander-rogers.biz/  This test is not yet approved or cleared by the [place] FDA and  has been authorized for detection and/or diagnosis of SARS-CoV-2 by FDA under an Emergency Use Authorization (EUA).  This EUA will remain in  effect (meaning this test can be used) for the duration of  the COV                          ID-19 declaration under Section 564(b)(1) of the Act, 21 U.S.C. section 360bbb-3(b)(1), unless the authorization is terminated or revoked sooner.     POC Amphetamine UR 06/07/2022 None Detected  NONE DETECTED (Cut Off Level 1000 ng/mL) Final   POC  Secobarbital (BAR) 06/07/2022 None Detected  NONE DETECTED (Cut Off Level 300 ng/mL) Final   POC Buprenorphine (BUP) 06/07/2022 None Detected  NONE DETECTED (Cut Off Level 10 ng/mL) Final   POC Oxazepam (BZO) 06/07/2022 None Detected  NONE DETECTED (Cut Off Level 300 ng/mL) Final   POC Cocaine UR 06/07/2022 None Detected  NONE DETECTED (Cut Off Level 300 ng/mL) Final   POC Methamphetamine UR 06/07/2022 None Detected  NONE DETECTED (Cut Off Level 1000 ng/mL) Final   POC Morphine 06/07/2022 None Detected  NONE DETECTED (Cut Off Level 300 ng/mL) Final   POC Methadone UR 06/07/2022 None Detected  NONE DETECTED (Cut Off Level 300 ng/mL) Final   POC Oxycodone UR 06/07/2022 None Detected  NONE DETECTED (Cut Off Level 100 ng/mL) Final   POC Marijuana UR 06/07/2022 None Detected  NONE DETECTED (Cut Off Level 50 ng/mL) Final   Preg Test, Ur 06/08/2022 NEGATIVE  NEGATIVE Final   Comment:        THE SENSITIVITY OF THIS METHODOLOGY IS >20 mIU/mL. Performed at [facility], [address] [place], [place], [place] [address]    Preg Test, Ur 06/08/2022 NEGATIVE  NEGATIVE Final   Comment:        THE SENSITIVITY OF THIS METHODOLOGY IS >24 mIU/mL   Admission on 02/07/2022, Discharged on 02/08/2022  Component Date Value Ref Range Status   SARS Coronavirus 2 by RT PCR 02/07/2022 NEGATIVE  NEGATIVE Final   Comment: (NOTE) SARS-CoV-2 target nucleic acids are NOT DETECTED.  The SARS-CoV-2 RNA is generally detectable in upper respiratory specimens during the acute phase of infection. The lowest concentration of SARS-CoV-2 viral copies this assay can detect is 138  copies/mL. A negative result does not preclude SARS-Cov-2 infection and should not be used as the sole basis for treatment or other patient management decisions. A negative result may occur with  improper specimen collection/handling, submission of specimen other than nasopharyngeal swab, presence of viral mutation(s) within the areas targeted by this assay, and inadequate number of viral copies(<138 copies/mL). A negative result must be combined with clinical observations, patient history, and epidemiological information. The expected result is Negative.  Fact Sheet for Patients:  BloggerCourse.com  Fact Sheet for Healthcare Providers:  SeriousBroker.it  This test is no                          t yet approved or cleared by the [place] FDA and  has been authorized for detection and/or diagnosis of SARS-CoV-2 by FDA under an Emergency Use Authorization (EUA). This EUA will remain  in effect (meaning this test can be used) for the duration of the COVID-19 declaration under Section 564(b)(1) of the Act, 21 U.S.C.section 360bbb-3(b)(1), unless the authorization is terminated  or revoked sooner.       Influenza A by PCR 02/07/2022 NEGATIVE  NEGATIVE Final   Influenza B by PCR 02/07/2022 NEGATIVE  NEGATIVE Final   Comment: (NOTE) The Xpert Xpress SARS-CoV-2/FLU/RSV plus assay is intended as an aid in the diagnosis of influenza from Nasopharyngeal swab specimens and should not be used as a sole basis for treatment. Nasal washings and aspirates are unacceptable for Xpert Xpress SARS-CoV-2/FLU/RSV testing.  Fact Sheet for Patients: BloggerCourse.com  Fact Sheet for Healthcare Providers: SeriousBroker.it  This test is not yet approved or cleared by the [place] FDA and has been authorized for detection and/or diagnosis of SARS-CoV-2 by FDA under an Emergency Use Authorization  (EUA). This EUA  will remain in effect (meaning this test can be used) for the duration of the COVID-19 declaration under Section 564(b)(1) of the Act, 21 U.S.C. section 360bbb-3(b)(1), unless the authorization is terminated or revoked.  Performed at [facility], [address], [place], [place] [address]    WBC 02/07/2022 8.1  4.0 - 10.5 K/uL Final   RBC 02/07/2022 4.55  3.87 - 5.11 MIL/uL Final   Hemoglobin 02/07/2022 13.6  12.0 - 15.0 g/dL Final   HCT 60/45/4098 41.9  36.0 - 46.0 % Final   MCV 02/07/2022 92.1  80.0 - 100.0 fL Final   MCH 02/07/2022 29.9  26.0 - 34.0 pg Final   MCHC 02/07/2022 32.5  30.0 - 36.0 g/dL Final   RDW 11/91/4782 12.9  11.5 - 15.5 % Final   Platelets 02/07/2022 286  150 - 400 K/uL Final   nRBC 02/07/2022 0.0  0.0 - 0.2 % Final   Neutrophils Relative % 02/07/2022 41  % Final   Neutro Abs 02/07/2022 3.3  1.7 - 7.7 K/uL Final   Lymphocytes Relative 02/07/2022 53  % Final   Lymphs Abs 02/07/2022 4.3 (H)  0.7 - 4.0 K/uL Final   Monocytes Relative 02/07/2022 6  % Final   Monocytes Absolute 02/07/2022 0.5  0.1 - 1.0 K/uL Final   Eosinophils Relative 02/07/2022 0  % Final   Eosinophils Absolute 02/07/2022 0.0  0.0 - 0.5 K/uL Final   Basophils Relative 02/07/2022 0  % Final   Basophils Absolute 02/07/2022 0.0  0.0 - 0.1 K/uL Final   Immature Granulocytes 02/07/2022 0  % Final   Abs Immature Granulocytes 02/07/2022 0.02  0.00 - 0.07 K/uL Final   Performed at [facility] [place] [facility], [address], [place], [place] [address]   Sodium 02/07/2022 137  135 - 145 mmol/L Final   Potassium 02/07/2022 4.1  3.5 - 5.1 mmol/L Final   Chloride 02/07/2022 104  98 - 111 mmol/L Final   CO2 02/07/2022 29  22 - 32 mmol/L Final   Glucose, Bld 02/07/2022 92  70 - 99 mg/dL Final   Glucose reference range applies only to samples taken after fasting for at least 8 hours.   BUN 02/07/2022 10  6 - 20 mg/dL Final   Creatinine, Ser 02/07/2022 0.88  0.44 - 1.00 mg/dL Final   Calcium  30/86/5784 9.4  8.9 - 10.3 mg/dL Final   Total Protein 69/62/9528 7.2  6.5 - 8.1 g/dL Final   Albumin 41/32/4401 3.9  3.5 - 5.0 g/dL Final   AST 02/72/5366 18  15 - 41 U/L Final   ALT 02/07/2022 25  0 - 44 U/L Final   Alkaline Phosphatase 02/07/2022 66  38 - 126 U/L Final   Total Bilirubin 02/07/2022 0.2 (L)  0.3 - 1.2 mg/dL Final   GFR, Estimated 02/07/2022 >60  >60 mL/min Final   Comment: (NOTE) Calculated using the CKD-EPI Creatinine Equation (2021)    Anion gap 02/07/2022 4 (L)  5 - 15 Final   Performed at [facility], [address] [place], [place] [address]   Hgb A1c MFr Bld 02/07/2022 5.0  4.8 - 5.6 % Final   Comment: (NOTE) Pre diabetes:          5.7%-6.4%  Diabetes:              >6.4%  Glycemic control for   <7.0% adults with diabetes    Mean Plasma Glucose 02/07/2022 96.8  mg/dL Final   Performed  at [facility], [address], [place], [place] [address]   Alcohol, Ethyl (B) 02/07/2022 <10  <10 mg/dL Final   Comment: (NOTE) Lowest detectable limit for serum alcohol is 10 mg/dL.  For medical purposes only. Performed at [facility], [address] [place], [place] [address]    Cholesterol 02/07/2022 192  0 - 200 mg/dL Final   Triglycerides 09/81/1914 192 (H)  <150 mg/dL Final   HDL 78/29/5621 46  >40 mg/dL Final   Total CHOL/HDL Ratio 02/07/2022 4.2  RATIO Final   VLDL 02/07/2022 38  0 - 40 mg/dL Final   LDL Cholesterol 02/07/2022 108 (H)  0 - 99 mg/dL Final   Comment:        Total Cholesterol/HDL:CHD Risk Coronary Heart Disease Risk Table                     Men   Women  1/2 Average Risk   3.4   3.3  Average Risk       5.0   4.4  2 X Average Risk   9.6   7.1  3 X Average Risk  23.4   11.0        Use the calculated Patient Ratio above and the CHD Risk Table to determine the patient's CHD Risk.        ATP III CLASSIFICATION (LDL):  <100     mg/dL   Optimal  308-657  mg/dL   Near or Above                    Optimal  130-159  mg/dL    Borderline  846-962  mg/dL   High  >952     mg/dL   Very High Performed at [facility], [address], [place], [place] [address]    TSH 02/07/2022 6.344 (H)  0.350 - 4.500 uIU/mL Final   Comment: Performed by a 3rd Generation assay with a functional sensitivity of <=0.01 uIU/mL. Performed at [facility], [address] [place], [place] [address]    POC Amphetamine UR 02/07/2022 None Detected  NONE DETECTED (Cut Off Level 1000 ng/mL) Final   POC Secobarbital (BAR) 02/07/2022 None Detected  NONE DETECTED (Cut Off Level 300 ng/mL) Final   POC Buprenorphine (BUP) 02/07/2022 None Detected  NONE DETECTED (Cut Off Level 10 ng/mL) Final   POC Oxazepam (BZO) 02/07/2022 None Detected  NONE DETECTED (Cut Off Level 300 ng/mL) Final   POC Cocaine UR 02/07/2022 None Detected  NONE DETECTED (Cut Off Level 300 ng/mL) Final   POC Methamphetamine UR 02/07/2022 None Detected  NONE DETECTED (Cut Off Level 1000 ng/mL) Final   POC Morphine 02/07/2022 None Detected  NONE DETECTED (Cut Off Level 300 ng/mL) Final   POC Oxycodone UR 02/07/2022 None Detected  NONE DETECTED (Cut Off Level 100 ng/mL) Final   POC Methadone UR 02/07/2022 None Detected  NONE DETECTED (Cut Off Level 300 ng/mL) Final   POC Marijuana UR 02/07/2022 None Detected  NONE DETECTED (Cut Off Level 50 ng/mL) Final   SARSCOV2ONAVIRUS 2 AG 02/07/2022 NEGATIVE  NEGATIVE Final   Comment: (NOTE) SARS-CoV-2 antigen NOT DETECTED.   Negative results are presumptive.  Negative results do not preclude SARS-CoV-2 infection and should not be used as the sole basis for treatment or other patient management decisions, including infection  control decisions, particularly in the presence of clinical signs and  symptoms consistent with COVID-19, or in those who  have been in contact with the virus.  Negative results must be combined with clinical observations, patient history, and epidemiological information. The expected result is  Negative.  Fact Sheet for Patients: HandmadeRecipes.com.cy  Fact Sheet for Healthcare Providers: FuneralLife.at  This test is not yet approved or cleared by the [place] FDA and  has been authorized for detection and/or diagnosis of SARS-CoV-2 by FDA under an Emergency Use Authorization (EUA).  This EUA will remain in effect (meaning this test can be used) for the duration of  the COV                          ID-19 declaration under Section 564(b)(1) of the Act, 21 U.S.C. section 360bbb-3(b)(1), unless the authorization is terminated or revoked sooner.     Preg Test, Ur 02/07/2022 NEGATIVE  NEGATIVE Final   Comment:        THE SENSITIVITY OF THIS METHODOLOGY IS >24 mIU/mL     Blood Alcohol level:  Lab Results  Component Value Date   ETH <10 07/04/2022   ETH <10 71/69/6789    Metabolic Disorder Labs: Lab Results  Component Value Date   HGBA1C 5.0 06/07/2022   MPG 96.8 06/07/2022   MPG 96.8 02/07/2022   No results found for: "PROLACTIN" Lab Results  Component Value Date   CHOL 181 07/25/2022   TRIG 118 07/25/2022   HDL 45 07/25/2022   CHOLHDL 4.0 07/25/2022   VLDL 24 07/25/2022   LDLCALC 112 (H) 07/25/2022   LDLCALC 88 06/07/2022    Therapeutic Lab Levels: No results found for: "LITHIUM" No results found for: "VALPROATE" No results found for: "CBMZ"  Physical Findings   GAD-7    Flowsheet Row Office Visit from 12/17/2021 in [place]  Total GAD-7 Score 17      PHQ2-9    [place] [facility] from 06/07/2022 in [facility] Office Visit from 12/17/2021 in [place]  PHQ-2 Total Score 1 2  PHQ-9 Total Score 2 8      [place] [facility] from 07/25/2022 in [facility] ED from 07/04/2022 in [place] [facility]PT ED from 06/14/2022 in [facility] High Risk No Risk No Risk        Musculoskeletal  Strength & Muscle Tone: within normal limits Gait & Station: normal Patient leans: N/A  Psychiatric Specialty Exam  Presentation  General Appearance:  Appropriate for Environment; Casual; Fairly Groomed  Eye Contact: Fair  Speech: Clear and Coherent; Normal Rate  Speech Volume: Decreased  Handedness: Right   Mood and Affect  Mood: Euthymic  Affect: Appropriate; Congruent; Constricted   Thought Process  Thought Processes: Coherent; Goal Directed; Linear  Descriptions of Associations:Intact  Orientation:Full (Time, Place and Person)  Thought Content:Logical; WDL  Diagnosis of Schizophrenia or Schizoaffective disorder in past: Yes    Hallucinations:Hallucinations: None Description of Auditory Hallucinations: Denied  Ideas of Reference:None  Suicidal Thoughts:Suicidal Thoughts: No (Contracted to safety.) SI Active Intent and/or Plan: -- (Denied)  Homicidal Thoughts:Homicidal Thoughts: No HI Active Intent and/or Plan: -- (Denied) HI Passive Intent and/or Plan: -- (Denied)   Sensorium  Memory: Immediate Good  Judgment: Fair  Insight: Fair   Executive Functions  Concentration: Good  Attention Span: Good  Recall: Good  Fund of Knowledge: Good  Language: Good   Psychomotor Activity  Psychomotor Activity: Psychomotor Activity: Normal   Assets  Assets: Communication Skills; Desire for Improvement   Sleep  Sleep: Sleep: Good  Physical Exam  Physical Exam Vitals and nursing note reviewed.  Constitutional:      General: She is not in acute distress.    Appearance: She is not ill-appearing or diaphoretic.  HENT:     Head: Normocephalic and atraumatic.  Pulmonary:     Effort: Pulmonary effort is normal. No respiratory distress.  Neurological:     General: No focal deficit present.     Mental Status: She is alert.     Blood pressure 99/88, pulse  100, temperature 98.5 F (36.9 C), temperature source Oral, resp. rate 18, SpO2 98 %. There is no height or weight on file to calculate BMI.  Treatment Plan Summary: [facility] for [facility].  Patient remains cleared by psychiatry.  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

## 2022-07-30 NOTE — ED Provider Notes (Signed)
Behavioral Health Progress Note  Date and Time: 07/30/2022 8:47 AM Name: Nicole [name] MRN:  [id number]   HPI: Nicole [name] is a 31 y.o. female, with PMH of SCZ unspecified, MDD with psychosis, who presented voluntary to [facility] (07/25/2022) via [facility] after a verbal altercation with [name] (not patient's legal guardian) for transient SI.  This is patient's fourth visit to [facility] for similar concerns this year. Patient has been dismissed from previous group home due to threatening SI and HI, then leaving the group home.   Legal Guardian: Mom (Maxine Williamson) Point of contact: [name]) APS is following.   Subjective:  Patient was initially seen resting in bed asleep comfortably, no acute distress, awoken easily.  She inquired about DSS, and stated that her dad has given a document to DSS to give to [facility].  Discussed with patient that this Thereasa Parkin will attempt to reach out to DSS for updates and obtain document.  She denied side effects from medications, is tolerating them well.  Denied SI/HI/AVH.  Denied any somatic concerns per below.  Patient had no other questions or concerns.  WU:[id number] Thoughts: No SI Active Intent and/or Plan:  (Denied) WG:[id number] Thoughts: No HI Active Intent and/or Plan:  (Denied) HI Passive Intent and/or Plan:  (Denied) VHQ:[id number]: None Description of Auditory Hallucinations: Denied  Mood: Euthymic Sleep:Good Appetite: Good  Review of Systems  Respiratory:  Negative for shortness of breath.   Cardiovascular:  Negative for chest pain.  Gastrointestinal:  Negative for nausea and vomiting.  Neurological:  Negative for dizziness and headaches.     Diagnosis:  Final diagnoses:  Severe episode of recurrent major depressive disorder, with psychotic features (HCC)    Total Time spent with patient: 20 minutes  Past Psychiatric History: schizophrenia, schizoaffective disorder, MDD, suicidal ideation Past Medical History:  Past Medical  History:  Diagnosis Date   Anxiety    Depression     Past Surgical History:  Procedure Laterality Date   WISDOM TOOTH EXTRACTION Bilateral 2020   Family History:  Family History  Problem Relation Age of Onset   Hypertension Father    Diabetes Father    Family Psychiatric  History: none reported Social History:  Social History   Substance and Sexual Activity  Alcohol Use Never     Social History   Substance and Sexual Activity  Drug Use Never    Social History   Socioeconomic History   Marital status: Single    Spouse name: Not on file   Number of children: Not on file   Years of education: Not on file   Highest education level: Not on file  Occupational History   Not on file  Tobacco Use   Smoking status: Every Day    Types: Cigarettes   Smokeless tobacco: Never  Vaping Use   Vaping Use: Some days  Substance and Sexual Activity   Alcohol use: Never   Drug use: Never   Sexual activity: Yes    Partners: Female    Birth control/protection: Implant  Other Topics Concern   Not on file  Social History Narrative   Not on file   Social Determinants of Health   Financial Resource Strain: Not on file  Food Insecurity: Not on file  Transportation Needs: Not on file  Physical Activity: Not on file  Stress: Not on file  Social Connections: Not on file   SDOH:  SDOH Screenings   Depression (PHQ2-9): Low Risk  (06/10/2022)  Tobacco Use: High Risk (07/04/2022)  Additional Social History:    Pain Medications: SEE MAR Prescriptions: SEE MAR Over the Counter: SEE MAR History of alcohol / drug use?: Yes Longest period of sobriety (when/how long): N/A Negative Consequences of Use:  (NONE) Withdrawal Symptoms: None Name of Substance 1: ETOH IN PAST--PT CURRENTLY DENIES 1 - Frequency: SOCIAL USE ETOH IN PAST 1 - Method of Aquiring: LEGAL 1- Route of Use: ORAL DRINK Name of Substance 2: NICOTINE-VAPE AND CIGARETTES 2 - Amount (size/oz): LESS THAN ONE PACK 2  - Frequency: DAILY 2 - Method of Aquiring: LEGAL 2 - Route of Substance Use: ORAL SMOKE                Current Medications:  Current Facility-Administered Medications  Medication Dose Route Frequency Provider Last Rate Last Admin   acetaminophen (TYLENOL) tablet 650 mg  650 mg Oral Q6H PRN Onuoha, Chinwendu V, NP   650 mg at 07/26/22 2134   alum & mag hydroxide-simeth (MAALOX/MYLANTA) 200-200-20 MG/5ML suspension 30 mL  30 mL Oral Q4H PRN Onuoha, Chinwendu V, NP   30 mL at 07/27/22 2151   clotrimazole (LOTRIMIN) 1 % cream   Topical BID Bobbitt, Shalon E, NP   Given at 07/29/22 2116   hydrOXYzine (ATARAX) tablet 25 mg  25 mg Oral TID PRN Onuoha, Chinwendu V, NP   25 mg at 07/30/22 0222   magnesium hydroxide (MILK OF MAGNESIA) suspension 30 mL  30 mL Oral Daily PRN Onuoha, Chinwendu V, NP       nicotine (NICODERM CQ - dosed in mg/24 hours) patch 14 mg  14 mg Transdermal Q0600 Onuoha, Chinwendu V, NP   14 mg at 07/29/22 0641   ondansetron (ZOFRAN) tablet 4 mg  4 mg Oral Once Nwoko, Uchenna E, PA       Oxcarbazepine (TRILEPTAL) tablet 300 mg  300 mg Oral BID Onuoha, Chinwendu V, NP   300 mg at 07/29/22 2114   QUEtiapine (SEROQUEL) tablet 400 mg  400 mg Oral BID Onuoha, Chinwendu V, NP   400 mg at 07/29/22 2115   sertraline (ZOLOFT) tablet 150 mg  150 mg Oral Daily Onuoha, Chinwendu V, NP   150 mg at 07/29/22 0847   traZODone (DESYREL) tablet 50 mg  50 mg Oral QHS PRN Onuoha, Chinwendu V, NP   50 mg at 07/30/22 0222   valACYclovir (VALTREX) tablet 500 mg  500 mg Oral Daily Princess Bruins, DO   500 mg at 07/29/22 1303   Current Outpatient Medications  Medication Sig Dispense Refill   ABILIFY MAINTENA 400 MG PRSY prefilled syringe 400 mg every 28 (twenty-eight) days.     cetirizine (ZYRTEC) 10 MG tablet Take 10 mg by mouth daily.     cyclobenzaprine (FLEXERIL) 10 MG tablet Take 1 tablet (10 mg total) by mouth 2 (two) times daily as needed for muscle spasms. 20 tablet 0   fluticasone (FLONASE)  50 MCG/ACT nasal spray Place 1 spray into both nostrils daily.     hydrOXYzine (ATARAX) 50 MG tablet Take 1 tablet (50 mg total) by mouth 2 (two) times daily. 60 tablet 0   meloxicam (MOBIC) 15 MG tablet Take 15 mg by mouth daily.     Oxcarbazepine (TRILEPTAL) 300 MG tablet Take 1 tablet (300 mg total) by mouth 2 (two) times daily. 60 tablet 0   QUEtiapine (SEROQUEL) 400 MG tablet Take 1 tablet (400 mg total) by mouth 2 (two) times daily. 60 tablet 0   sertraline (ZOLOFT) 50 MG tablet Take 3 tablets (150  mg total) by mouth in the morning. 90 tablet 0   traZODone (DESYREL) 100 MG tablet Take 1 tablet (100 mg total) by mouth at bedtime. 30 tablet 0   valACYclovir (VALTREX) 500 MG tablet Take 500 mg by mouth daily.      Labs  Lab Results:  Admission on 07/25/2022  Component Date Value Ref Range Status   SARS Coronavirus 2 by RT PCR 07/25/2022 NEGATIVE  NEGATIVE Final   Comment: (NOTE) SARS-CoV-2 target nucleic acids are NOT DETECTED.  The SARS-CoV-2 RNA is generally detectable in upper respiratory specimens during the acute phase of infection. The lowest concentration of SARS-CoV-2 viral copies this assay can detect is 138 copies/mL. A negative result does not preclude SARS-Cov-2 infection and should not be used as the sole basis for treatment or other patient management decisions. A negative result may occur with  improper specimen collection/handling, submission of specimen other than nasopharyngeal swab, presence of viral mutation(s) within the areas targeted by this assay, and inadequate number of viral copies(<138 copies/mL). A negative result must be combined with clinical observations, patient history, and epidemiological information. The expected result is Negative.  Fact Sheet for Patients:  EntrepreneurPulse.com.au  Fact Sheet for Healthcare Providers:  IncredibleEmployment.be  This test is no                          t yet approved or  cleared by the [place] FDA and  has been authorized for detection and/or diagnosis of SARS-CoV-2 by FDA under an Emergency Use Authorization (EUA). This EUA will remain  in effect (meaning this test can be used) for the duration of the COVID-19 declaration under Section 564(b)(1) of the Act, 21 U.S.C.section 360bbb-3(b)(1), unless the authorization is terminated  or revoked sooner.       Influenza A by PCR 07/25/2022 NEGATIVE  NEGATIVE Final   Influenza B by PCR 07/25/2022 NEGATIVE  NEGATIVE Final   Comment: (NOTE) The Xpert Xpress SARS-CoV-2/FLU/RSV plus assay is intended as an aid in the diagnosis of influenza from Nasopharyngeal swab specimens and should not be used as a sole basis for treatment. Nasal washings and aspirates are unacceptable for Xpert Xpress SARS-CoV-2/FLU/RSV testing.  Fact Sheet for Patients: EntrepreneurPulse.com.au  Fact Sheet for Healthcare Providers: IncredibleEmployment.be  This test is not yet approved or cleared by the [place] FDA and has been authorized for detection and/or diagnosis of SARS-CoV-2 by FDA under an Emergency Use Authorization (EUA). This EUA will remain in effect (meaning this test can be used) for the duration of the COVID-19 declaration under Section 564(b)(1) of the Act, 21 U.S.C. section 360bbb-3(b)(1), unless the authorization is terminated or revoked.  Performed at [facility], [place] [address] [place], [place] [address]    WBC 07/25/2022 8.3  4.0 - 10.5 K/uL Final   RBC 07/25/2022 4.44  3.87 - 5.11 MIL/uL Final   Hemoglobin 07/25/2022 13.7  12.0 - 15.0 g/dL Final   HCT 07/25/2022 40.2  36.0 - 46.0 % Final   MCV 07/25/2022 90.5  80.0 - 100.0 fL Final   MCH 07/25/2022 30.9  26.0 - 34.0 pg Final   MCHC 07/25/2022 34.1  30.0 - 36.0 g/dL Final   RDW 07/25/2022 12.2  11.5 - 15.5 % Final   Platelets 07/25/2022 248  150 - 400 K/uL Final   nRBC 07/25/2022 0.0  0.0 - 0.2 % Final    Neutrophils Relative % 07/25/2022 43  % Final  Neutro Abs 07/25/2022 3.6  1.7 - 7.7 K/uL Final   Lymphocytes Relative 07/25/2022 52  % Final   Lymphs Abs 07/25/2022 4.2 (H)  0.7 - 4.0 K/uL Final   Monocytes Relative 07/25/2022 5  % Final   Monocytes Absolute 07/25/2022 0.4  0.1 - 1.0 K/uL Final   Eosinophils Relative 07/25/2022 0  % Final   Eosinophils Absolute 07/25/2022 0.0  0.0 - 0.5 K/uL Final   Basophils Relative 07/25/2022 0  % Final   Basophils Absolute 07/25/2022 0.0  0.0 - 0.1 K/uL Final   Immature Granulocytes 07/25/2022 0  % Final   Abs Immature Granulocytes 07/25/2022 0.02  0.00 - 0.07 K/uL Final   Performed at [facility], [address], [place], [place] [address]   Sodium 07/25/2022 138  135 - 145 mmol/L Final   Potassium 07/25/2022 4.0  3.5 - 5.1 mmol/L Final   Chloride 07/25/2022 104  98 - 111 mmol/L Final   CO2 07/25/2022 29  22 - 32 mmol/L Final   Glucose, Bld 07/25/2022 83  70 - 99 mg/dL Final   Glucose reference range applies only to samples taken after fasting for at least 8 hours.   BUN 07/25/2022 11  6 - 20 mg/dL Final   Creatinine, Ser 07/25/2022 0.97  0.44 - 1.00 mg/dL Final   Calcium 60/45/4098 9.2  8.9 - 10.3 mg/dL Final   Total Protein 11/91/4782 7.0  6.5 - 8.1 g/dL Final   Albumin 95/62/1308 3.8  3.5 - 5.0 g/dL Final   AST 65/78/4696 18  15 - 41 U/L Final   ALT 07/25/2022 22  0 - 44 U/L Final   Alkaline Phosphatase 07/25/2022 64  38 - 126 U/L Final   Total Bilirubin 07/25/2022 0.2 (L)  0.3 - 1.2 mg/dL Final   GFR, Estimated 07/25/2022 >60  >60 mL/min Final   Comment: (NOTE) Calculated using the CKD-EPI Creatinine Equation (2021)    Anion gap 07/25/2022 5  5 - 15 Final   Performed at [facility], [address] [place], [place] [address]   POC Amphetamine UR 07/25/2022 None Detected  NONE DETECTED (Cut Off Level 1000 ng/mL) Preliminary   POC Secobarbital (BAR) 07/25/2022 None Detected  NONE DETECTED (Cut Off Level 300 ng/mL)  Preliminary   POC Buprenorphine (BUP) 07/25/2022 None Detected  NONE DETECTED (Cut Off Level 10 ng/mL) Preliminary   POC Oxazepam (BZO) 07/25/2022 None Detected  NONE DETECTED (Cut Off Level 300 ng/mL) Preliminary   POC Cocaine UR 07/25/2022 None Detected  NONE DETECTED (Cut Off Level 300 ng/mL) Preliminary   POC Methamphetamine UR 07/25/2022 None Detected  NONE DETECTED (Cut Off Level 1000 ng/mL) Preliminary   POC Morphine 07/25/2022 None Detected  NONE DETECTED (Cut Off Level 300 ng/mL) Preliminary   POC Methadone UR 07/25/2022 None Detected  NONE DETECTED (Cut Off Level 300 ng/mL) Preliminary   POC Oxycodone UR 07/25/2022 None Detected  NONE DETECTED (Cut Off Level 100 ng/mL) Preliminary   POC Marijuana UR 07/25/2022 None Detected  NONE DETECTED (Cut Off Level 50 ng/mL) Preliminary   SARSCOV2ONAVIRUS 2 AG 07/25/2022 NEGATIVE  NEGATIVE Final   Comment: (NOTE) SARS-CoV-2 antigen NOT DETECTED.   Negative results are presumptive.  Negative results do not preclude SARS-CoV-2 infection and should not be used as the sole basis for treatment or other patient management decisions, including infection  control decisions, particularly in the presence of clinical signs and  symptoms consistent with COVID-19, or in those who have been in  contact with the virus.  Negative results must be combined with clinical observations, patient history, and epidemiological information. The expected result is Negative.  Fact Sheet for Patients: https://www.jennings-kim.com/  Fact Sheet for Healthcare Providers: https://alexander-rogers.biz/  This test is not yet approved or cleared by the [place] FDA and  has been authorized for detection and/or diagnosis of SARS-CoV-2 by FDA under an Emergency Use Authorization (EUA).  This EUA will remain in effect (meaning this test can be used) for the duration of  the COV                          ID-19 declaration under Section 564(b)(1) of  the Act, 21 U.S.C. section 360bbb-3(b)(1), unless the authorization is terminated or revoked sooner.     Cholesterol 07/25/2022 181  0 - 200 mg/dL Final   Triglycerides 30/86/5784 118  <150 mg/dL Final   HDL 69/62/9528 45  >40 mg/dL Final   Total CHOL/HDL Ratio 07/25/2022 4.0  RATIO Final   VLDL 07/25/2022 24  0 - 40 mg/dL Final   LDL Cholesterol 07/25/2022 112 (H)  0 - 99 mg/dL Final   Comment:        Total Cholesterol/HDL:CHD Risk Coronary Heart Disease Risk Table                     Men   Women  1/2 Average Risk   3.4   3.3  Average Risk       5.0   4.4  2 X Average Risk   9.6   7.1  3 X Average Risk  23.4   11.0        Use the calculated Patient Ratio above and the CHD Risk Table to determine the patient's CHD Risk.        ATP III CLASSIFICATION (LDL):  <100     mg/dL   Optimal  413-244  mg/dL   Near or Above                    Optimal  130-159  mg/dL   Borderline  010-272  mg/dL   High  >536     mg/dL   Very High Performed at [facility], [address], [place], [place] [address]    TSH 07/25/2022 6.668 (H)  0.350 - 4.500 uIU/mL Final   Comment: Performed by a 3rd Generation assay with a functional sensitivity of <=0.01 uIU/mL. Performed at [facility], [address], [place], [place] [address]    Glucose-Capillary 07/26/2022 104 (H)  70 - 99 mg/dL Final   Glucose reference range applies only to samples taken after fasting for at least 8 hours.  Admission on 07/04/2022, Discharged on 07/05/2022  Component Date Value Ref Range Status   Sodium 07/04/2022 142  135 - 145 mmol/L Final   Potassium 07/04/2022 4.4  3.5 - 5.1 mmol/L Final   Chloride 07/04/2022 109  98 - 111 mmol/L Final   CO2 07/04/2022 26  22 - 32 mmol/L Final   Glucose, Bld 07/04/2022 96  70 - 99 mg/dL Final   Glucose reference range applies only to samples taken after fasting for at least 8 hours.   BUN 07/04/2022 15  6 - 20 mg/dL Final   Creatinine, Ser 07/04/2022 0.83  0.44 - 1.00  mg/dL Final   Calcium 95/63/8756 9.3  8.9 - 10.3 mg/dL Final   Total Protein 43/32/9518 7.2  6.5 -  8.1 g/dL Final   Albumin 43/32/9518 3.9  3.5 - 5.0 g/dL Final   AST 84/16/6063 23  15 - 41 U/L Final   ALT 07/04/2022 28  0 - 44 U/L Final   Alkaline Phosphatase 07/04/2022 77  38 - 126 U/L Final   Total Bilirubin 07/04/2022 0.4  0.3 - 1.2 mg/dL Final   GFR, Estimated 07/04/2022 >60  >60 mL/min Final   Comment: (NOTE) Calculated using the CKD-EPI Creatinine Equation (2021)    Anion gap 07/04/2022 7  5 - 15 Final   Performed at [facility], [address] [place], [place] [address]   Alcohol, Ethyl (B) 07/04/2022 <10  <10 mg/dL Final   Comment: (NOTE) Lowest detectable limit for serum alcohol is 10 mg/dL.  For medical purposes only. Performed at [facility], [address], [place], [place] [address]    WBC 07/04/2022 7.0  4.0 - 10.5 K/uL Final   RBC 07/04/2022 4.18  3.87 - 5.11 MIL/uL Final   Hemoglobin 07/04/2022 12.8  12.0 - 15.0 g/dL Final   HCT 55/73/2202 39.1  36.0 - 46.0 % Final   MCV 07/04/2022 93.5  80.0 - 100.0 fL Final   MCH 07/04/2022 30.6  26.0 - 34.0 pg Final   MCHC 07/04/2022 32.7  30.0 - 36.0 g/dL Final   RDW 54/27/0623 12.9  11.5 - 15.5 % Final   Platelets 07/04/2022 243  150 - 400 K/uL Final   nRBC 07/04/2022 0.0  0.0 - 0.2 % Final   Neutrophils Relative % 07/04/2022 43  % Final   Neutro Abs 07/04/2022 3.0  1.7 - 7.7 K/uL Final   Lymphocytes Relative 07/04/2022 50  % Final   Lymphs Abs 07/04/2022 3.5  0.7 - 4.0 K/uL Final   Monocytes Relative 07/04/2022 7  % Final   Monocytes Absolute 07/04/2022 0.5  0.1 - 1.0 K/uL Final   Eosinophils Relative 07/04/2022 0  % Final   Eosinophils Absolute 07/04/2022 0.0  0.0 - 0.5 K/uL Final   Basophils Relative 07/04/2022 0  % Final   Basophils Absolute 07/04/2022 0.0  0.0 - 0.1 K/uL Final   Immature Granulocytes 07/04/2022 0  % Final   Abs Immature Granulocytes 07/04/2022 0.01  0.00 -  0.07 K/uL Final   Performed at [facility], [address] [place], [place] [address]   I-stat hCG, quantitative 07/04/2022 <5.0  <5 mIU/mL Final   Comment 3 07/04/2022          Final   Comment:   GEST. AGE      CONC.  (mIU/mL)   <=1 WEEK        5 - 50     2 WEEKS       50 - 500     3 WEEKS       100 - 10,000     4 WEEKS     1,000 - 30,000        FEMALE AND NON-PREGNANT FEMALE:     LESS THAN 5 mIU/mL   Admission on 06/14/2022, Discharged on 06/14/2022  Component Date Value Ref Range Status   Color, UA 06/14/2022 yellow  yellow Final   Clarity, UA 06/14/2022 cloudy (A)  clear Final   Glucose, UA 06/14/2022 negative  negative mg/dL Final   Bilirubin, UA 15/17/6160 negative  negative Final   Ketones, POC UA 06/14/2022 negative  negative mg/dL Final   Spec Grav, UA 73/71/0626 1.025  1.010 - 1.025 Final  Blood, UA 06/14/2022 negative  negative Final   pH, UA 06/14/2022 7.5  5.0 - 8.0 Final   Protein Ur, POC 06/14/2022 =30 (A)  negative mg/dL Final   Urobilinogen, UA 06/14/2022 0.2  0.2 or 1.0 E.U./dL Final   Nitrite, UA 65/78/4696 Negative  Negative Final   Leukocytes, UA 06/14/2022 Negative  Negative Final   Preg Test, Ur 06/14/2022 Negative  Negative Final   Specimen Description 06/14/2022 URINE, CLEAN CATCH   Final   Special Requests 06/14/2022    Final                   Value:NONE Performed at [facility], [address] [place], [place] [address]    Culture 06/14/2022 MULTIPLE SPECIES PRESENT, SUGGEST RECOLLECTION (A)   Final   Report Status 06/14/2022 06/16/2022 FINAL   Final  Admission on 06/07/2022, Discharged on 06/10/2022  Component Date Value Ref Range Status   SARS Coronavirus 2 by RT PCR 06/07/2022 NEGATIVE  NEGATIVE Final   Comment: (NOTE) SARS-CoV-2 target nucleic acids are NOT DETECTED.  The SARS-CoV-2 RNA is generally detectable in upper respiratory specimens during the acute phase of infection. The lowest concentration of SARS-CoV-2  viral copies this assay can detect is 138 copies/mL. A negative result does not preclude SARS-Cov-2 infection and should not be used as the sole basis for treatment or other patient management decisions. A negative result may occur with  improper specimen collection/handling, submission of specimen other than nasopharyngeal swab, presence of viral mutation(s) within the areas targeted by this assay, and inadequate number of viral copies(<138 copies/mL). A negative result must be combined with clinical observations, patient history, and epidemiological information. The expected result is Negative.  Fact Sheet for Patients:  BloggerCourse.com  Fact Sheet for Healthcare Providers:  SeriousBroker.it  This test is no                          t yet approved or cleared by the [place] FDA and  has been authorized for detection and/or diagnosis of SARS-CoV-2 by FDA under an Emergency Use Authorization (EUA). This EUA will remain  in effect (meaning this test can be used) for the duration of the COVID-19 declaration under Section 564(b)(1) of the Act, 21 U.S.C.section 360bbb-3(b)(1), unless the authorization is terminated  or revoked sooner.       Influenza A by PCR 06/07/2022 NEGATIVE  NEGATIVE Final   Influenza B by PCR 06/07/2022 NEGATIVE  NEGATIVE Final   Comment: (NOTE) The Xpert Xpress SARS-CoV-2/FLU/RSV plus assay is intended as an aid in the diagnosis of influenza from Nasopharyngeal swab specimens and should not be used as a sole basis for treatment. Nasal washings and aspirates are unacceptable for Xpert Xpress SARS-CoV-2/FLU/RSV testing.  Fact Sheet for Patients: BloggerCourse.com  Fact Sheet for Healthcare Providers: SeriousBroker.it  This test is not yet approved or cleared by the [place] FDA and has been authorized for detection and/or diagnosis of SARS-CoV-2  by FDA under an Emergency Use Authorization (EUA). This EUA will remain in effect (meaning this test can be used) for the duration of the COVID-19 declaration under Section 564(b)(1) of the Act, 21 U.S.C. section 360bbb-3(b)(1), unless the authorization is terminated or revoked.  Performed at [facility], [address] [place], [place] [address]    WBC 06/07/2022 5.7  4.0 - 10.5 K/uL Final   RBC 06/07/2022 4.39  3.87 - 5.11 MIL/uL Final   Hemoglobin  06/07/2022 13.2  12.0 - 15.0 g/dL Final   HCT 08/65/7846 40.4  36.0 - 46.0 % Final   MCV 06/07/2022 92.0  80.0 - 100.0 fL Final   MCH 06/07/2022 30.1  26.0 - 34.0 pg Final   MCHC 06/07/2022 32.7  30.0 - 36.0 g/dL Final   RDW 96/29/5284 12.4  11.5 - 15.5 % Final   Platelets 06/07/2022 308  150 - 400 K/uL Final   nRBC 06/07/2022 0.0  0.0 - 0.2 % Final   Neutrophils Relative % 06/07/2022 42  % Final   Neutro Abs 06/07/2022 2.4  1.7 - 7.7 K/uL Final   Lymphocytes Relative 06/07/2022 54  % Final   Lymphs Abs 06/07/2022 3.1  0.7 - 4.0 K/uL Final   Monocytes Relative 06/07/2022 4  % Final   Monocytes Absolute 06/07/2022 0.2  0.1 - 1.0 K/uL Final   Eosinophils Relative 06/07/2022 0  % Final   Eosinophils Absolute 06/07/2022 0.0  0.0 - 0.5 K/uL Final   Basophils Relative 06/07/2022 0  % Final   Basophils Absolute 06/07/2022 0.0  0.0 - 0.1 K/uL Final   Immature Granulocytes 06/07/2022 0  % Final   Abs Immature Granulocytes 06/07/2022 0.01  0.00 - 0.07 K/uL Final   Performed at [facility], [address] [place], [place] [address]   Sodium 06/07/2022 139  135 - 145 mmol/L Final   Potassium 06/07/2022 4.0  3.5 - 5.1 mmol/L Final   Chloride 06/07/2022 104  98 - 111 mmol/L Final   CO2 06/07/2022 28  22 - 32 mmol/L Final   Glucose, Bld 06/07/2022 104 (H)  70 - 99 mg/dL Final   Glucose reference range applies only to samples taken after fasting for at least 8 hours.   BUN 06/07/2022 8  6 - 20 mg/dL Final   Creatinine, Ser  06/07/2022 0.84  0.44 - 1.00 mg/dL Final   Calcium 10/16/7251 9.1  8.9 - 10.3 mg/dL Final   Total Protein 66/44/0347 6.9  6.5 - 8.1 g/dL Final   Albumin 42/59/5638 3.7  3.5 - 5.0 g/dL Final   AST 75/64/3329 19  15 - 41 U/L Final   ALT 06/07/2022 22  0 - 44 U/L Final   Alkaline Phosphatase 06/07/2022 54  38 - 126 U/L Final   Total Bilirubin 06/07/2022 0.4  0.3 - 1.2 mg/dL Final   GFR, Estimated 06/07/2022 >60  >60 mL/min Final   Comment: (NOTE) Calculated using the CKD-EPI Creatinine Equation (2021)    Anion gap 06/07/2022 7  5 - 15 Final   Performed at [facility], [address], [place], [place] [address]   Hgb A1c MFr Bld 06/07/2022 5.0  4.8 - 5.6 % Final   Comment: (NOTE) Pre diabetes:          5.7%-6.4%  Diabetes:              >6.4%  Glycemic control for   <7.0% adults with diabetes    Mean Plasma Glucose 06/07/2022 96.8  mg/dL Final   Performed at [facility], [address] [address], [place], [place] [address]   TSH 06/07/2022 1.620  0.350 - 4.500 uIU/mL Final   Comment: Performed by a 3rd Generation assay with a functional sensitivity of <=0.01 uIU/mL. Performed at [facility], [address], [place], [place] [address]    RPR Ser Ql 06/07/2022 NON REACTIVE  NON REACTIVE Final   Performed at [facility], [address] [place], [place] [address]   Color, Urine 06/07/2022 YELLOW  YELLOW Final   APPearance 06/07/2022 HAZY (A)  CLEAR Final   Specific Gravity, Urine 06/07/2022 1.018  1.005 - 1.030 Final   pH 06/07/2022 7.0  5.0 - 8.0 Final   Glucose, UA 06/07/2022 NEGATIVE  NEGATIVE mg/dL Final   Hgb urine dipstick 06/07/2022 NEGATIVE  NEGATIVE Final   Bilirubin Urine 06/07/2022 NEGATIVE  NEGATIVE Final   Ketones, ur 06/07/2022 NEGATIVE  NEGATIVE mg/dL Final   Protein, ur 60/45/4098 NEGATIVE  NEGATIVE mg/dL Final   Nitrite 11/91/4782 NEGATIVE  NEGATIVE Final   Leukocytes,Ua 06/07/2022 NEGATIVE  NEGATIVE Final   Performed at [facility],  [address], [place], [place] [address]   Cholesterol 06/07/2022 164  0 - 200 mg/dL Final   Triglycerides 30/86/5784 158 (H)  <150 mg/dL Final   HDL 69/62/9528 44  >40 mg/dL Final   Total CHOL/HDL Ratio 06/07/2022 3.7  RATIO Final   VLDL 06/07/2022 32  0 - 40 mg/dL Final   LDL Cholesterol 06/07/2022 88  0 - 99 mg/dL Final   Comment:        Total Cholesterol/HDL:CHD Risk Coronary Heart Disease Risk Table                     Men   Women  1/2 Average Risk   3.4   3.3  Average Risk       5.0   4.4  2 X Average Risk   9.6   7.1  3 X Average Risk  23.4   11.0        Use the calculated Patient Ratio above and the CHD Risk Table to determine the patient's CHD Risk.        ATP III CLASSIFICATION (LDL):  <100     mg/dL   Optimal  413-244  mg/dL   Near or Above                    Optimal  130-159  mg/dL   Borderline  010-272  mg/dL   High  >536     mg/dL   Very High Performed at [facility], [address], [place], [place] [address]    HIV Screen 4th Generation wRfx 06/07/2022 Non Reactive  Non Reactive Final   Performed at [facility], [address] [place], [place] [address]   SARSCOV2ONAVIRUS 2 AG 06/07/2022 NEGATIVE  NEGATIVE Final   Comment: (NOTE) SARS-CoV-2 antigen NOT DETECTED.   Negative results are presumptive.  Negative results do not preclude SARS-CoV-2 infection and should not be used as the sole basis for treatment or other patient management decisions, including infection  control decisions, particularly in the presence of clinical signs and  symptoms consistent with COVID-19, or in those who have been in contact with the virus.  Negative results must be combined with clinical observations, patient history, and epidemiological information. The expected result is Negative.  Fact Sheet for Patients: https://www.jennings-kim.com/  Fact Sheet for Healthcare Providers: https://alexander-rogers.biz/  This test is not yet approved  or cleared by the [place] FDA and  has been authorized for detection and/or diagnosis of SARS-CoV-2 by FDA under an Emergency Use Authorization (EUA).  This EUA will remain in effect (meaning this test can be used) for the duration of  the COV  ID-19 declaration under Section 564(b)(1) of the Act, 21 U.S.C. section 360bbb-3(b)(1), unless the authorization is terminated or revoked sooner.     POC Amphetamine UR 06/07/2022 None Detected  NONE DETECTED (Cut Off Level 1000 ng/mL) Final   POC Secobarbital (BAR) 06/07/2022 None Detected  NONE DETECTED (Cut Off Level 300 ng/mL) Final   POC Buprenorphine (BUP) 06/07/2022 None Detected  NONE DETECTED (Cut Off Level 10 ng/mL) Final   POC Oxazepam (BZO) 06/07/2022 None Detected  NONE DETECTED (Cut Off Level 300 ng/mL) Final   POC Cocaine UR 06/07/2022 None Detected  NONE DETECTED (Cut Off Level 300 ng/mL) Final   POC Methamphetamine UR 06/07/2022 None Detected  NONE DETECTED (Cut Off Level 1000 ng/mL) Final   POC Morphine 06/07/2022 None Detected  NONE DETECTED (Cut Off Level 300 ng/mL) Final   POC Methadone UR 06/07/2022 None Detected  NONE DETECTED (Cut Off Level 300 ng/mL) Final   POC Oxycodone UR 06/07/2022 None Detected  NONE DETECTED (Cut Off Level 100 ng/mL) Final   POC Marijuana UR 06/07/2022 None Detected  NONE DETECTED (Cut Off Level 50 ng/mL) Final   Preg Test, Ur 06/08/2022 NEGATIVE  NEGATIVE Final   Comment:        THE SENSITIVITY OF THIS METHODOLOGY IS >20 mIU/mL. Performed at [facility], [address] [place], [place] [address]    Preg Test, Ur 06/08/2022 NEGATIVE  NEGATIVE Final   Comment:        THE SENSITIVITY OF THIS METHODOLOGY IS >24 mIU/mL   Admission on 02/07/2022, Discharged on 02/08/2022  Component Date Value Ref Range Status   SARS Coronavirus 2 by RT PCR 02/07/2022 NEGATIVE  NEGATIVE Final   Comment: (NOTE) SARS-CoV-2 target nucleic acids are NOT DETECTED.  The SARS-CoV-2  RNA is generally detectable in upper respiratory specimens during the acute phase of infection. The lowest concentration of SARS-CoV-2 viral copies this assay can detect is 138 copies/mL. A negative result does not preclude SARS-Cov-2 infection and should not be used as the sole basis for treatment or other patient management decisions. A negative result may occur with  improper specimen collection/handling, submission of specimen other than nasopharyngeal swab, presence of viral mutation(s) within the areas targeted by this assay, and inadequate number of viral copies(<138 copies/mL). A negative result must be combined with clinical observations, patient history, and epidemiological information. The expected result is Negative.  Fact Sheet for Patients:  BloggerCourse.comhttps://www.fda.gov/media/152166/download  Fact Sheet for Healthcare Providers:  SeriousBroker.ithttps://www.fda.gov/media/152162/download  This test is no                          t yet approved or cleared by the [place] FDA and  has been authorized for detection and/or diagnosis of SARS-CoV-2 by FDA under an Emergency Use Authorization (EUA). This EUA will remain  in effect (meaning this test can be used) for the duration of the COVID-19 declaration under Section 564(b)(1) of the Act, 21 U.S.C.section 360bbb-3(b)(1), unless the authorization is terminated  or revoked sooner.       Influenza A by PCR 02/07/2022 NEGATIVE  NEGATIVE Final   Influenza B by PCR 02/07/2022 NEGATIVE  NEGATIVE Final   Comment: (NOTE) The Xpert Xpress SARS-CoV-2/FLU/RSV plus assay is intended as an aid in the diagnosis of influenza from Nasopharyngeal swab specimens and should not be used as a sole basis for treatment. Nasal washings and aspirates are unacceptable for Xpert Xpress SARS-CoV-2/FLU/RSV testing.  Fact Sheet for Patients: BloggerCourse.comhttps://www.fda.gov/media/152166/download  Fact Sheet for Healthcare  Providers: SeriousBroker.it  This test is not yet approved or cleared by the [place] FDA and has been authorized for detection and/or diagnosis of SARS-CoV-2 by FDA under an Emergency Use Authorization (EUA). This EUA will remain in effect (meaning this test can be used) for the duration of the COVID-19 declaration under Section 564(b)(1) of the Act, 21 U.S.C. section 360bbb-3(b)(1), unless the authorization is terminated or revoked.  Performed at [facility], [address], [place], [place] [address]    WBC 02/07/2022 8.1  4.0 - 10.5 K/uL Final   RBC 02/07/2022 4.55  3.87 - 5.11 MIL/uL Final   Hemoglobin 02/07/2022 13.6  12.0 - 15.0 g/dL Final   HCT 29/52/8413 41.9  36.0 - 46.0 % Final   MCV 02/07/2022 92.1  80.0 - 100.0 fL Final   MCH 02/07/2022 29.9  26.0 - 34.0 pg Final   MCHC 02/07/2022 32.5  30.0 - 36.0 g/dL Final   RDW 24/40/1027 12.9  11.5 - 15.5 % Final   Platelets 02/07/2022 286  150 - 400 K/uL Final   nRBC 02/07/2022 0.0  0.0 - 0.2 % Final   Neutrophils Relative % 02/07/2022 41  % Final   Neutro Abs 02/07/2022 3.3  1.7 - 7.7 K/uL Final   Lymphocytes Relative 02/07/2022 53  % Final   Lymphs Abs 02/07/2022 4.3 (H)  0.7 - 4.0 K/uL Final   Monocytes Relative 02/07/2022 6  % Final   Monocytes Absolute 02/07/2022 0.5  0.1 - 1.0 K/uL Final   Eosinophils Relative 02/07/2022 0  % Final   Eosinophils Absolute 02/07/2022 0.0  0.0 - 0.5 K/uL Final   Basophils Relative 02/07/2022 0  % Final   Basophils Absolute 02/07/2022 0.0  0.0 - 0.1 K/uL Final   Immature Granulocytes 02/07/2022 0  % Final   Abs Immature Granulocytes 02/07/2022 0.02  0.00 - 0.07 K/uL Final   Performed at [facility], [address], [place], [place] [address]   Sodium 02/07/2022 137  135 - 145 mmol/L Final   Potassium 02/07/2022 4.1  3.5 - 5.1 mmol/L Final   Chloride 02/07/2022 104  98 - 111 mmol/L Final   CO2 02/07/2022 29  22 - 32 mmol/L Final   Glucose, Bld  02/07/2022 92  70 - 99 mg/dL Final   Glucose reference range applies only to samples taken after fasting for at least 8 hours.   BUN 02/07/2022 10  6 - 20 mg/dL Final   Creatinine, Ser 02/07/2022 0.88  0.44 - 1.00 mg/dL Final   Calcium 44/12/4740 9.4  8.9 - 10.3 mg/dL Final   Total Protein 59/56/3875 7.2  6.5 - 8.1 g/dL Final   Albumin 64/33/2951 3.9  3.5 - 5.0 g/dL Final   AST 88/41/6606 18  15 - 41 U/L Final   ALT 02/07/2022 25  0 - 44 U/L Final   Alkaline Phosphatase 02/07/2022 66  38 - 126 U/L Final   Total Bilirubin 02/07/2022 0.2 (L)  0.3 - 1.2 mg/dL Final   GFR, Estimated 02/07/2022 >60  >60 mL/min Final   Comment: (NOTE) Calculated using the CKD-EPI Creatinine Equation (2021)    Anion gap 02/07/2022 4 (L)  5 - 15 Final   Performed at [facility], [address], [place], [place] [address]   Hgb A1c MFr Bld 02/07/2022 5.0  4.8 - 5.6 % Final   Comment: (NOTE) Pre diabetes:  5.7%-6.4%  Diabetes:              >6.4%  Glycemic control for   <7.0% adults with diabetes    Mean Plasma Glucose 02/07/2022 96.8  mg/dL Final   Performed at [facility], [address] [place], [place] [address]   Alcohol, Ethyl (B) 02/07/2022 <10  <10 mg/dL Final   Comment: (NOTE) Lowest detectable limit for serum alcohol is 10 mg/dL.  For medical purposes only. Performed at [facility], [address] [place], [place] [address]    Cholesterol 02/07/2022 192  0 - 200 mg/dL Final   Triglycerides 37/85/8850 192 (H)  <150 mg/dL Final   HDL 27/74/1287 46  >40 mg/dL Final   Total CHOL/HDL Ratio 02/07/2022 4.2  RATIO Final   VLDL 02/07/2022 38  0 - 40 mg/dL Final   LDL Cholesterol 02/07/2022 108 (H)  0 - 99 mg/dL Final   Comment:        Total Cholesterol/HDL:CHD Risk Coronary Heart Disease Risk Table                     Men   Women  1/2 Average Risk   3.4   3.3  Average Risk       5.0   4.4  2 X Average Risk   9.6   7.1  3 X Average Risk  23.4   11.0        Use  the calculated Patient Ratio above and the CHD Risk Table to determine the patient's CHD Risk.        ATP III CLASSIFICATION (LDL):  <100     mg/dL   Optimal  867-672  mg/dL   Near or Above                    Optimal  130-159  mg/dL   Borderline  094-709  mg/dL   High  >628     mg/dL   Very High Performed at [facility], [address], [place], [place] [address]    TSH 02/07/2022 6.344 (H)  0.350 - 4.500 uIU/mL Final   Comment: Performed by a 3rd Generation assay with a functional sensitivity of <=0.01 uIU/mL. Performed at [facility], [address] [place], [place] [address]    POC Amphetamine UR 02/07/2022 None Detected  NONE DETECTED (Cut Off Level 1000 ng/mL) Final   POC Secobarbital (BAR) 02/07/2022 None Detected  NONE DETECTED (Cut Off Level 300 ng/mL) Final   POC Buprenorphine (BUP) 02/07/2022 None Detected  NONE DETECTED (Cut Off Level 10 ng/mL) Final   POC Oxazepam (BZO) 02/07/2022 None Detected  NONE DETECTED (Cut Off Level 300 ng/mL) Final   POC Cocaine UR 02/07/2022 None Detected  NONE DETECTED (Cut Off Level 300 ng/mL) Final   POC Methamphetamine UR 02/07/2022 None Detected  NONE DETECTED (Cut Off Level 1000 ng/mL) Final   POC Morphine 02/07/2022 None Detected  NONE DETECTED (Cut Off Level 300 ng/mL) Final   POC Oxycodone UR 02/07/2022 None Detected  NONE DETECTED (Cut Off Level 100 ng/mL) Final   POC Methadone UR 02/07/2022 None Detected  NONE DETECTED (Cut Off Level 300 ng/mL) Final   POC Marijuana UR 02/07/2022 None Detected  NONE DETECTED (Cut Off Level 50 ng/mL) Final   SARSCOV2ONAVIRUS 2 AG 02/07/2022 NEGATIVE  NEGATIVE Final   Comment: (NOTE) SARS-CoV-2 antigen NOT DETECTED.   Negative results are presumptive.  Negative results do not  preclude SARS-CoV-2 infection and should not be used as the sole basis for treatment or other patient management decisions, including infection  control decisions, particularly in the presence of clinical signs and   symptoms consistent with COVID-19, or in those who have been in contact with the virus.  Negative results must be combined with clinical observations, patient history, and epidemiological information. The expected result is Negative.  Fact Sheet for Patients: https://www.jennings-kim.com/  Fact Sheet for Healthcare Providers: https://alexander-rogers.biz/  This test is not yet approved or cleared by the [place] FDA and  has been authorized for detection and/or diagnosis of SARS-CoV-2 by FDA under an Emergency Use Authorization (EUA).  This EUA will remain in effect (meaning this test can be used) for the duration of  the COV                          ID-19 declaration under Section 564(b)(1) of the Act, 21 U.S.C. section 360bbb-3(b)(1), unless the authorization is terminated or revoked sooner.     Preg Test, Ur 02/07/2022 NEGATIVE  NEGATIVE Final   Comment:        THE SENSITIVITY OF THIS METHODOLOGY IS >24 mIU/mL     Blood Alcohol level:  Lab Results  Component Value Date   ETH <10 07/04/2022   ETH <10 02/07/2022    Metabolic Disorder Labs: Lab Results  Component Value Date   HGBA1C 5.0 06/07/2022   MPG 96.8 06/07/2022   MPG 96.8 02/07/2022   No results found for: "PROLACTIN" Lab Results  Component Value Date   CHOL 181 07/25/2022   TRIG 118 07/25/2022   HDL 45 07/25/2022   CHOLHDL 4.0 07/25/2022   VLDL 24 07/25/2022   LDLCALC 112 (H) 07/25/2022   LDLCALC 88 06/07/2022    Therapeutic Lab Levels: No results found for: "LITHIUM" No results found for: "VALPROATE" No results found for: "CBMZ"  Physical Findings   GAD-7    Flowsheet Row Office Visit from [facility]/17/2021 in [facility]  Total GAD-7 Score 17      PHQ2-9    Flowsheet Row ED from 06/07/2022 in [facility] Office Visit from 12/17/2021 in [facility]  PHQ-2 Total Score 1 2  PHQ-9 Total  Score 2 8      Flowsheet Row ED from 07/25/2022 in [facility] ED from 07/04/2022 in [facility] from 06/14/2022 in [facility]   C-SSRS RISK CATEGORY High Risk No Risk No Risk        Musculoskeletal  Strength & Muscle Tone: within normal limits Gait & Station: normal Patient leans: N/A  Psychiatric Specialty Exam  Presentation  General Appearance:  Appropriate for Environment; Casual; Fairly Groomed (Sleepy appearing, just woke up.)  Eye Contact: Good  Speech: Clear and Coherent; Normal Rate  Speech Volume: Decreased  Handedness: Right   Mood and Affect  Mood: Euthymic  Affect: Appropriate; Congruent; Full Range   Thought Process  Thought Processes: Coherent; Goal Directed; Linear  Descriptions of Associations:Intact  Orientation:Full (Time, Place and Person)  Thought Content:Logical; WDL  Diagnosis of Schizophrenia or Schizoaffective disorder in past: Yes    Hallucinations:Hallucinations: None Description of Auditory Hallucinations: Denied  Ideas of Reference:None  Suicidal Thoughts:Suicidal Thoughts: No SI Active Intent and/or Plan: -- (Denied)  Homicidal Thoughts:Homicidal Thoughts: No HI Active Intent and/or Plan: -- (Denied) HI Passive Intent  and/or Plan: -- (Denied)   Sensorium  Memory: Immediate Good  Judgment: Fair  Insight: Fair   Executive Functions  Concentration: Good  Attention Span: Good  Recall: Good  Fund of Knowledge: Good  Language: Good   Psychomotor Activity  Psychomotor Activity: Psychomotor Activity: Normal   Assets  Assets: Communication Skills; Desire for Improvement   Sleep  Sleep: Sleep: Good  Physical Exam  Physical Exam Vitals and nursing note reviewed.  Constitutional:      General: She is not in acute distress.    Appearance: She is not ill-appearing or diaphoretic.  HENT:     Head:  Normocephalic and atraumatic.  Pulmonary:     Effort: Pulmonary effort is normal. No respiratory distress.  Neurological:     General: No focal deficit present.     Mental Status: She is alert.     Blood pressure 120/72, pulse (!) 114, temperature 97.6 F (36.4 C), temperature source Oral, resp. rate 18, SpO2 98 %. There is no height or weight on file to calculate BMI.  Treatment Plan Summary: [facility] team continues to seek placement for patient.   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  Signed: Princess Bruins, DO Psychiatry Resident, PGY-2 07/30/2022, 8:47 AM  BEHAVIORAL [facility] [address] [place] [address] Dept: [phone] Dept Fax: [phone]

## 2022-07-30 NOTE — ED Notes (Signed)
Pt is sleeping@this time. Breathing even and unlabored. Will continue to monitor for safety 

## 2022-07-30 NOTE — Plan of Care (Signed)
Was not able to get in contact with DSS for updates on patient's APS case x2.  Left a HIPAA compliant voicemail.  Signed: Merrily Brittle, DO Psychiatry Resident, PGY-2 07/30/2022, 8:40 AM  [place] [address] Dept: [phone] Dept Fax: [phone]

## 2022-07-30 NOTE — Progress Notes (Signed)
Received Nicole [name] this AM asleep in her chair bed, she woke up and received breakfast. Shortly thereafter she was compliant with her medications. She endorsed feeling anxious this AM.

## 2022-07-30 NOTE — ED Notes (Signed)
Pt. Watched TV with peer, took HS meds without difficulty. She states she feels much better. Requested Trazodone for sleep. Will continue to monitor for safety.

## 2022-07-30 NOTE — ED Notes (Signed)
Pt. Found sitting in bathroom floor. She said she is anxious due to all of the arguing on unit. She states that it has triggered her ' sexual urges" and she does not know how to control them. Reassured her safety. PRN med for anxiety given and staff moved her to another bed. Will continue to monitor for safety.

## 2022-07-30 NOTE — ED Notes (Signed)
Pt resting quietly on recliner bed, watching TV. She states she is doing better with bed change and the anxiety med. She thanked staff for helping her. Will continue to monitor for safety

## 2022-07-30 NOTE — Progress Notes (Addendum)
Nicole [name] remains visible on the unit, sitting up on the side of the bed at intervals and napping. She is concerned about her appointment with her outside provider. The social worker was notified via chat.

## 2022-07-30 NOTE — ED Notes (Signed)
Pt  awake /alert. Requesting to speak to this nurse privately. She states that she is having issues with "sexual urges". She states she finds it difficult to control. Reassured pt that staff was monitoring behavior and she should seek out staff if unable to control urges. She voices understanding. Denies SI/HI.  Will continue to monitor for safety.

## 2022-07-30 NOTE — ED Notes (Signed)
Pt given dinner meal.  Eating with no distress noted.

## 2022-07-31 LAB — T4, FREE: Free T4: 0.6 ng/dL — ABNORMAL LOW (ref 0.61–1.12)

## 2022-07-31 MED ORDER — PANTOPRAZOLE SODIUM 20 MG PO TBEC
20.0000 mg | DELAYED_RELEASE_TABLET | Freq: Every day | ORAL | Status: DC
  Administered 2022-07-31 – 2022-10-01 (×63): 20 mg via ORAL
  Filled 2022-07-31 (×63): qty 1

## 2022-07-31 NOTE — ED Notes (Signed)
Pt sleeping. Will continue to monitor for safety

## 2022-07-31 NOTE — ED Notes (Signed)
Pt. Awake, watching TV. She denies SI/HI. States she is having a better day. No noted resp distress. Pt. Agrees to seek out staff if feeling overwhelmed or having negative thoughts. Will continue to monitor for safety

## 2022-07-31 NOTE — ED Notes (Signed)
[name]&Ox4. Patient denies SI/HI and AVH. Patient denies any physical complaints when asked. No acute distress noted. Support and encouragement provided. Routine safety checks conducted according to facility protocol. Encouraged patient to notify staff if thoughts of harm toward self or others arise. Patient verbalize understanding and agreement. Will continue to monitor for safety.    

## 2022-07-31 NOTE — Plan of Care (Cosign Needed)
Was able to get in contact with patient's dad Nicole [name]), stated that he plans on faxing over guardianship and consent papers this evening.  Provided front desk fax number [phone]).  Signed: Merrily Brittle, DO Psychiatry Resident, PGY-2 [facility] 07/31/2022, 11:23 AM

## 2022-07-31 NOTE — ED Notes (Signed)
Patient resting quietly in bed with eyes closed. Respirations equal and unlabored, skin warm and dry, NAD. Routine safety checks conducted according to facility protocol. Will continue to monitor for safety.  

## 2022-07-31 NOTE — ED Notes (Signed)
Pt sleeping@this time. Breathing even and unlabored. Will continue to monitor for saftey 

## 2022-07-31 NOTE — ED Provider Notes (Signed)
Behavioral Health Progress Note  Date and Time: 07/31/2022 10:26 AM Name: Nicole [name] MRN:  [id number]   HPI: Nicole [name] is a 31 y.o. female, with PMH of SCZ unspecified, MDD with psychosis, who presented voluntary to [facility] (07/25/2022) via [facility] after a verbal altercation with Nicole [name] (not patient's legal guardian) for transient SI.  This is patient's fourth visit to [facility] for similar concerns this year. Patient has been dismissed from previous group home due to threatening SI and HI, then leaving the group home.   Legal Guardian: Nicole [name] (Nicole [name]) Point of contact: Nicole [name] Nicole [name]) APS is following.   Subjective:  Patient was initially seen resting and sitting up in bed, comfortably, no acute distress.  Stated she called her Nicole [name] yesterday, who requested to speak with the team about disposition.  Also stated that she was anxious yesterday due to having a sex urge.  Said that she attends [facility] ([facility]).  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  However patient stated she was able to cope with the urges, and PRN's were helpful.  Stated that at this moment she is feeling all right.   Reported feeling nauseous after dinner, that starts when she lays down.  Reported having a sour or metallic taste in the back of her throat at the same time.  She reported that after she eats, stated that she feels like her food does not settle.  Described the symptoms only after eating at night. Otherwise she had no other questions or concerns.  [id number] Thoughts: No (Contracted to safety) SI Active Intent and/or Plan:  (Denied) [id number] Thoughts: No HI Active Intent and/or Plan:  (Denied) HI Passive Intent and/or Plan:  (Denied) [id number]: None Description of Auditory Hallucinations: Denied  Mood:  Anxious Sleep:Good Appetite: Good  Review of Systems  Respiratory:  Negative for shortness of breath.   Cardiovascular:  Negative for chest pain.  Gastrointestinal:  Negative for nausea and vomiting.  Neurological:  Negative for dizziness and headaches.    Diagnosis:  Final diagnoses:  Severe episode of recurrent major depressive disorder, with psychotic features (HCC)    Total Time spent with patient: 20 minutes  Past Psychiatric History: schizophrenia, schizoaffective disorder, MDD, suicidal ideation Past Medical History:  Past Medical History:  Diagnosis Date   Anxiety    Depression     Past Surgical History:  Procedure Laterality Date   WISDOM TOOTH EXTRACTION Bilateral 2020   Family History:  Family History  Problem Relation Age of Onset   Hypertension Father    Diabetes Father    Family Psychiatric  History: none reported Social History:  Social History   Substance and Sexual Activity  Alcohol Use Never     Social History   Substance and Sexual Activity  Drug Use Never    Social History   Socioeconomic History   Marital status: Single    Spouse name: Not on file   Number of children: Not on file   Years of education: Not on file   Highest education level: Not on file  Occupational History   Not on file  Tobacco Use   Smoking status: Every Day    Types: Cigarettes   Smokeless tobacco: Never  Vaping Use   Vaping Use: Some days  Substance and Sexual Activity   Alcohol use: Never   Drug use:  Never   Sexual activity: Yes    Partners: Female    Birth control/protection: Implant  Other Topics Concern   Not on file  Social History Narrative   Not on file   Social Determinants of Health   Financial Resource Strain: Not on file  Food Insecurity: Not on file  Transportation Needs: Not on file  Physical Activity: Not on file  Stress: Not on file  Social Connections: Not on file   SDOH:  SDOH Screenings   Depression (PHQ2-9): Low Risk   (06/10/2022)  Tobacco Use: High Risk (07/04/2022)   Additional Social History:    Pain Medications: SEE MAR Prescriptions: SEE MAR Over the Counter: SEE MAR History of alcohol / drug use?: Yes Longest period of sobriety (when/how long): N/A Negative Consequences of Use:  (NONE) Withdrawal Symptoms: None Name of Substance 1: ETOH IN PAST--PT CURRENTLY DENIES 1 - Frequency: SOCIAL USE ETOH IN PAST 1 - Method of Aquiring: LEGAL 1- Route of Use: ORAL DRINK Name of Substance 2: NICOTINE-VAPE AND CIGARETTES 2 - Amount (size/oz): LESS THAN ONE PACK 2 - Frequency: DAILY 2 - Method of Aquiring: LEGAL 2 - Route of Substance Use: ORAL SMOKE                Current Medications:  Current Facility-Administered Medications  Medication Dose Route Frequency Provider Last Rate Last Admin   acetaminophen (TYLENOL) tablet 650 mg  650 mg Oral Q6H PRN Onuoha, Chinwendu V, NP   650 mg at 07/26/22 2134   alum & mag hydroxide-simeth (MAALOX/MYLANTA) 200-200-20 MG/5ML suspension 30 mL  30 mL Oral Q4H PRN Onuoha, Chinwendu V, NP   30 mL at 07/27/22 2151   clotrimazole (LOTRIMIN) 1 % cream   Topical BID Bobbitt, Shalon E, NP   Given at 07/31/22 0945   hydrOXYzine (ATARAX) tablet 25 mg  25 mg Oral TID PRN Onuoha, Chinwendu V, NP   25 mg at 07/30/22 2010   magnesium hydroxide (MILK OF MAGNESIA) suspension 30 mL  30 mL Oral Daily PRN Onuoha, Chinwendu V, NP       nicotine (NICODERM CQ - dosed in mg/24 hours) patch 14 mg  14 mg Transdermal Q0600 Onuoha, Chinwendu V, NP   14 mg at 07/31/22 0947   ondansetron (ZOFRAN) tablet 4 mg  4 mg Oral Once Nwoko, Uchenna E, PA       Oxcarbazepine (TRILEPTAL) tablet 300 mg  300 mg Oral BID Onuoha, Chinwendu V, NP   300 mg at 07/31/22 0944   pantoprazole (PROTONIX) EC tablet 20 mg  20 mg Oral Daily Princess Bruins, DO   20 mg at 07/31/22 0944   QUEtiapine (SEROQUEL) tablet 400 mg  400 mg Oral BID Onuoha, Chinwendu V, NP   400 mg at 07/31/22 0944   sertraline (ZOLOFT) tablet  150 mg  150 mg Oral Daily Onuoha, Chinwendu V, NP   150 mg at 07/31/22 0944   traZODone (DESYREL) tablet 50 mg  50 mg Oral QHS PRN Onuoha, Chinwendu V, NP   50 mg at 07/30/22 2132   valACYclovir (VALTREX) tablet 500 mg  500 mg Oral Daily Princess Bruins, DO   500 mg at 07/31/22 0944   Current Outpatient Medications  Medication Sig Dispense Refill   ABILIFY MAINTENA 400 MG PRSY prefilled syringe 400 mg every 28 (twenty-eight) days.     cetirizine (ZYRTEC) 10 MG tablet Take 10 mg by mouth daily.     cyclobenzaprine (FLEXERIL) 10 MG tablet Take 1 tablet (10 mg total)  by mouth 2 (two) times daily as needed for muscle spasms. 20 tablet 0   fluticasone (FLONASE) 50 MCG/ACT nasal spray Place 1 spray into both nostrils daily.     hydrOXYzine (ATARAX) 50 MG tablet Take 1 tablet (50 mg total) by mouth 2 (two) times daily. 60 tablet 0   meloxicam (MOBIC) 15 MG tablet Take 15 mg by mouth daily.     Oxcarbazepine (TRILEPTAL) 300 MG tablet Take 1 tablet (300 mg total) by mouth 2 (two) times daily. 60 tablet 0   QUEtiapine (SEROQUEL) 400 MG tablet Take 1 tablet (400 mg total) by mouth 2 (two) times daily. 60 tablet 0   sertraline (ZOLOFT) 50 MG tablet Take 3 tablets (150 mg total) by mouth in the morning. 90 tablet 0   traZODone (DESYREL) 100 MG tablet Take 1 tablet (100 mg total) by mouth at bedtime. 30 tablet 0   valACYclovir (VALTREX) 500 MG tablet Take 500 mg by mouth daily.      Labs  Lab Results:  Admission on 07/25/2022  Component Date Value Ref Range Status   SARS Coronavirus 2 by RT PCR 07/25/2022 NEGATIVE  NEGATIVE Final   Comment: (NOTE) SARS-CoV-2 target nucleic acids are NOT DETECTED.  The SARS-CoV-2 RNA is generally detectable in upper respiratory specimens during the acute phase of infection. The lowest concentration of SARS-CoV-2 viral copies this assay can detect is 138 copies/mL. A negative result does not preclude SARS-Cov-2 infection and should not be used as the sole basis for  treatment or other patient management decisions. A negative result may occur with  improper specimen collection/handling, submission of specimen other than nasopharyngeal swab, presence of viral mutation(s) within the areas targeted by this assay, and inadequate number of viral copies(<138 copies/mL). A negative result must be combined with clinical observations, patient history, and epidemiological information. The expected result is Negative.  Fact Sheet for Patients:  BloggerCourse.com  Fact Sheet for Healthcare Providers:  SeriousBroker.it  This test is no                          t yet approved or cleared by the [place] FDA and  has been authorized for detection and/or diagnosis of SARS-CoV-2 by FDA under an Emergency Use Authorization (EUA). This EUA will remain  in effect (meaning this test can be used) for the duration of the COVID-19 declaration under Section 564(b)(1) of the Act, 21 U.S.C.section 360bbb-3(b)(1), unless the authorization is terminated  or revoked sooner.       Influenza A by PCR 07/25/2022 NEGATIVE  NEGATIVE Final   Influenza B by PCR 07/25/2022 NEGATIVE  NEGATIVE Final   Comment: (NOTE) The Xpert Xpress SARS-CoV-2/FLU/RSV plus assay is intended as an aid in the diagnosis of influenza from Nasopharyngeal swab specimens and should not be used as a sole basis for treatment. Nasal washings and aspirates are unacceptable for Xpert Xpress SARS-CoV-2/FLU/RSV testing.  Fact Sheet for Patients: BloggerCourse.com  Fact Sheet for Healthcare Providers: SeriousBroker.it  This test is not yet approved or cleared by the [place] FDA and has been authorized for detection and/or diagnosis of SARS-CoV-2 by FDA under an Emergency Use Authorization (EUA). This EUA will remain in effect (meaning this test can be used) for the duration of the COVID-19  declaration under Section 564(b)(1) of the Act, 21 U.S.C. section 360bbb-3(b)(1), unless the authorization is terminated or revoked.  Performed at [facility], [address] [place], [place] [address]  WBC 07/25/2022 8.3  4.0 - 10.5 K/uL Final   RBC 07/25/2022 4.44  3.87 - 5.11 MIL/uL Final   Hemoglobin 07/25/2022 13.7  12.0 - 15.0 g/dL Final   HCT 16/07/9603 40.2  36.0 - 46.0 % Final   MCV 07/25/2022 90.5  80.0 - 100.0 fL Final   MCH 07/25/2022 30.9  26.0 - 34.0 pg Final   MCHC 07/25/2022 34.1  30.0 - 36.0 g/dL Final   RDW 54/06/8118 12.2  11.5 - 15.5 % Final   Platelets 07/25/2022 248  150 - 400 K/uL Final   nRBC 07/25/2022 0.0  0.0 - 0.2 % Final   Neutrophils Relative % 07/25/2022 43  % Final   Neutro Abs 07/25/2022 3.6  1.7 - 7.7 K/uL Final   Lymphocytes Relative 07/25/2022 52  % Final   Lymphs Abs 07/25/2022 4.2 (H)  0.7 - 4.0 K/uL Final   Monocytes Relative 07/25/2022 5  % Final   Monocytes Absolute 07/25/2022 0.4  0.1 - 1.0 K/uL Final   Eosinophils Relative 07/25/2022 0  % Final   Eosinophils Absolute 07/25/2022 0.0  0.0 - 0.5 K/uL Final   Basophils Relative 07/25/2022 0  % Final   Basophils Absolute 07/25/2022 0.0  0.0 - 0.1 K/uL Final   Immature Granulocytes 07/25/2022 0  % Final   Abs Immature Granulocytes 07/25/2022 0.02  0.00 - 0.07 K/uL Final   Performed at [facility], [address], [place], [place] [address]   Sodium 07/25/2022 138  135 - 145 mmol/L Final   Potassium 07/25/2022 4.0  3.5 - 5.1 mmol/L Final   Chloride 07/25/2022 104  98 - 111 mmol/L Final   CO2 07/25/2022 29  22 - 32 mmol/L Final   Glucose, Bld 07/25/2022 83  70 - 99 mg/dL Final   Glucose reference range applies only to samples taken after fasting for at least 8 hours.   BUN 07/25/2022 11  6 - 20 mg/dL Final   Creatinine, Ser 07/25/2022 0.97  0.44 - 1.00 mg/dL Final   Calcium 95/62/1308 9.2  8.9 - 10.3 mg/dL Final   Total Protein 65/78/4696 7.0  6.5 - 8.1 g/dL Final   Albumin  29/52/8413 3.8  3.5 - 5.0 g/dL Final   AST 24/40/1027 18  15 - 41 U/L Final   ALT 07/25/2022 22  0 - 44 U/L Final   Alkaline Phosphatase 07/25/2022 64  38 - 126 U/L Final   Total Bilirubin 07/25/2022 0.2 (L)  0.3 - 1.2 mg/dL Final   GFR, Estimated 07/25/2022 >60  >60 mL/min Final   Comment: (NOTE) Calculated using the CKD-EPI Creatinine Equation (2021)    Anion gap 07/25/2022 5  5 - 15 Final   Performed at [facility], [address], [place], [place] [address]   POC Amphetamine UR 07/25/2022 None Detected  NONE DETECTED (Cut Off Level 1000 ng/mL) Preliminary   POC Secobarbital (BAR) 07/25/2022 None Detected  NONE DETECTED (Cut Off Level 300 ng/mL) Preliminary   POC Buprenorphine (BUP) 07/25/2022 None Detected  NONE DETECTED (Cut Off Level 10 ng/mL) Preliminary   POC Oxazepam (BZO) 07/25/2022 None Detected  NONE DETECTED (Cut Off Level 300 ng/mL) Preliminary   POC Cocaine UR 07/25/2022 None Detected  NONE DETECTED (Cut Off Level 300 ng/mL) Preliminary   POC Methamphetamine UR 07/25/2022 None Detected  NONE DETECTED (Cut Off Level 1000 ng/mL) Preliminary   POC Morphine 07/25/2022 None Detected  NONE DETECTED (Cut Off Level 300 ng/mL) Preliminary   POC Methadone  UR 07/25/2022 None Detected  NONE DETECTED (Cut Off Level 300 ng/mL) Preliminary   POC Oxycodone UR 07/25/2022 None Detected  NONE DETECTED (Cut Off Level 100 ng/mL) Preliminary   POC Marijuana UR 07/25/2022 None Detected  NONE DETECTED (Cut Off Level 50 ng/mL) Preliminary   SARSCOV2ONAVIRUS 2 AG 07/25/2022 NEGATIVE  NEGATIVE Final   Comment: (NOTE) SARS-CoV-2 antigen NOT DETECTED.   Negative results are presumptive.  Negative results do not preclude SARS-CoV-2 infection and should not be used as the sole basis for treatment or other patient management decisions, including infection  control decisions, particularly in the presence of clinical signs and  symptoms consistent with COVID-19, or in those who have been in contact  with the virus.  Negative results must be combined with clinical observations, patient history, and epidemiological information. The expected result is Negative.  Fact Sheet for Patients: https://www.jennings-kim.com/  Fact Sheet for Healthcare Providers: https://alexander-rogers.biz/  This test is not yet approved or cleared by the [place] FDA and  has been authorized for detection and/or diagnosis of SARS-CoV-2 by FDA under an Emergency Use Authorization (EUA).  This EUA will remain in effect (meaning this test can be used) for the duration of  the COV                          ID-19 declaration under Section 564(b)(1) of the Act, 21 U.S.C. section 360bbb-3(b)(1), unless the authorization is terminated or revoked sooner.     Cholesterol 07/25/2022 181  0 - 200 mg/dL Final   Triglycerides 16/07/9603 118  <150 mg/dL Final   HDL 54/06/8118 45  >40 mg/dL Final   Total CHOL/HDL Ratio 07/25/2022 4.0  RATIO Final   VLDL 07/25/2022 24  0 - 40 mg/dL Final   LDL Cholesterol 07/25/2022 112 (H)  0 - 99 mg/dL Final   Comment:        Total Cholesterol/HDL:CHD Risk Coronary Heart Disease Risk Table                     Men   Women  1/2 Average Risk   3.4   3.3  Average Risk       5.0   4.4  2 X Average Risk   9.6   7.1  3 X Average Risk  23.4   11.0        Use the calculated Patient Ratio above and the CHD Risk Table to determine the patient's CHD Risk.        ATP III CLASSIFICATION (LDL):  <100     mg/dL   Optimal  147-829  mg/dL   Near or Above                    Optimal  130-159  mg/dL   Borderline  562-130  mg/dL   High  >865     mg/dL   Very High Performed at [facility], [address] [place], [place] [address]    TSH 07/25/2022 6.668 (H)  0.350 - 4.500 uIU/mL Final   Comment: Performed by a 3rd Generation assay with a functional sensitivity of <=0.01 uIU/mL. Performed at [facility], [address], [place], [place] [address]     Glucose-Capillary 07/26/2022 104 (H)  70 - 99 mg/dL Final   Glucose reference range applies only to samples taken after fasting for at least 8 hours.  Admission on 07/04/2022, Discharged on 07/05/2022  Component  Date Value Ref Range Status   Sodium 07/04/2022 142  135 - 145 mmol/L Final   Potassium 07/04/2022 4.4  3.5 - 5.1 mmol/L Final   Chloride 07/04/2022 109  98 - 111 mmol/L Final   CO2 07/04/2022 26  22 - 32 mmol/L Final   Glucose, Bld 07/04/2022 96  70 - 99 mg/dL Final   Glucose reference range applies only to samples taken after fasting for at least 8 hours.   BUN 07/04/2022 15  6 - 20 mg/dL Final   Creatinine, Ser 07/04/2022 0.83  0.44 - 1.00 mg/dL Final   Calcium 68/08/5725 9.3  8.9 - 10.3 mg/dL Final   Total Protein 20/35/5974 7.2  6.5 - 8.1 g/dL Final   Albumin 16/38/4536 3.9  3.5 - 5.0 g/dL Final   AST 46/80/3212 23  15 - 41 U/L Final   ALT 07/04/2022 28  0 - 44 U/L Final   Alkaline Phosphatase 07/04/2022 77  38 - 126 U/L Final   Total Bilirubin 07/04/2022 0.4  0.3 - 1.2 mg/dL Final   GFR, Estimated 07/04/2022 >60  >60 mL/min Final   Comment: (NOTE) Calculated using the CKD-EPI Creatinine Equation (2021)    Anion gap 07/04/2022 7  5 - 15 Final   Performed at [facility], [address] [place], [place] [address]   Alcohol, Ethyl (B) 07/04/2022 <10  <10 mg/dL Final   Comment: (NOTE) Lowest detectable limit for serum alcohol is 10 mg/dL.  For medical purposes only. Performed at [facility] [address], [place], [place] [address]    WBC 07/04/2022 7.0  4.0 - 10.5 K/uL Final   RBC 07/04/2022 4.18  3.87 - 5.11 MIL/uL Final   Hemoglobin 07/04/2022 12.8  12.0 - 15.0 g/dL Final   HCT 48/88/9169 39.1  36.0 - 46.0 % Final   MCV 07/04/2022 93.5  80.0 - 100.0 fL Final   MCH 07/04/2022 30.6  26.0 - 34.0 pg Final   MCHC 07/04/2022 32.7  30.0 - 36.0 g/dL Final   RDW 45/12/8880 12.9  11.5 - 15.5 % Final   Platelets 07/04/2022 243  150  - 400 K/uL Final   nRBC 07/04/2022 0.0  0.0 - 0.2 % Final   Neutrophils Relative % 07/04/2022 43  % Final   Neutro Abs 07/04/2022 3.0  1.7 - 7.7 K/uL Final   Lymphocytes Relative 07/04/2022 50  % Final   Lymphs Abs 07/04/2022 3.5  0.7 - 4.0 K/uL Final   Monocytes Relative 07/04/2022 7  % Final   Monocytes Absolute 07/04/2022 0.5  0.1 - 1.0 K/uL Final   Eosinophils Relative 07/04/2022 0  % Final   Eosinophils Absolute 07/04/2022 0.0  0.0 - 0.5 K/uL Final   Basophils Relative 07/04/2022 0  % Final   Basophils Absolute 07/04/2022 0.0  0.0 - 0.1 K/uL Final   Immature Granulocytes 07/04/2022 0  % Final   Abs Immature Granulocytes 07/04/2022 0.01  0.00 - 0.07 K/uL Final   Performed at [facility], [address], [place], [place] [address]   I-stat hCG, quantitative 07/04/2022 <5.0  <5 mIU/mL Final   Comment 3 07/04/2022          Final   Comment:   GEST. AGE      CONC.  (mIU/mL)   <=1 WEEK        5 - 50     2 WEEKS       50 - 500  3 WEEKS       100 - 10,000     4 WEEKS     1,000 - 30,000        FEMALE AND NON-PREGNANT FEMALE:     LESS THAN 5 mIU/mL   Admission on 06/14/2022, Discharged on 06/14/2022  Component Date Value Ref Range Status   Color, UA 06/14/2022 yellow  yellow Final   Clarity, UA 06/14/2022 cloudy (A)  clear Final   Glucose, UA 06/14/2022 negative  negative mg/dL Final   Bilirubin, UA 08/65/7846 negative  negative Final   Ketones, POC UA 06/14/2022 negative  negative mg/dL Final   Spec Grav, UA 96/29/5284 1.025  1.010 - 1.025 Final   Blood, UA 06/14/2022 negative  negative Final   pH, UA 06/14/2022 7.5  5.0 - 8.0 Final   Protein Ur, POC 06/14/2022 =30 (A)  negative mg/dL Final   Urobilinogen, UA 06/14/2022 0.2  0.2 or 1.0 E.U./dL Final   Nitrite, UA 13/24/4010 Negative  Negative Final   Leukocytes, UA 06/14/2022 Negative  Negative Final   Preg Test, Ur 06/14/2022 Negative  Negative Final   Specimen Description 06/14/2022 URINE, CLEAN CATCH    Final   Special Requests 06/14/2022    Final                   Value:NONE Performed at [facility], [address], [place], [place] [address]    Culture 06/14/2022 MULTIPLE SPECIES PRESENT, SUGGEST RECOLLECTION (A)   Final   Report Status 06/14/2022 06/16/2022 FINAL   Final  Admission on 06/07/2022, Discharged on 06/10/2022  Component Date Value Ref Range Status   SARS Coronavirus 2 by RT PCR 06/07/2022 NEGATIVE  NEGATIVE Final   Comment: (NOTE) SARS-CoV-2 target nucleic acids are NOT DETECTED.  The SARS-CoV-2 RNA is generally detectable in upper respiratory specimens during the acute phase of infection. The lowest concentration of SARS-CoV-2 viral copies this assay can detect is 138 copies/mL. A negative result does not preclude SARS-Cov-2 infection and should not be used as the sole basis for treatment or other patient management decisions. A negative result may occur with  improper specimen collection/handling, submission of specimen other than nasopharyngeal swab, presence of viral mutation(s) within the areas targeted by this assay, and inadequate number of viral copies(<138 copies/mL). A negative result must be combined with clinical observations, patient history, and epidemiological information. The expected result is Negative.  Fact Sheet for Patients:  BloggerCourse.com  Fact Sheet for Healthcare Providers:  SeriousBroker.it  This test is no                          t yet approved or cleared by the [place] FDA and  has been authorized for detection and/or diagnosis of SARS-CoV-2 by FDA under an Emergency Use Authorization (EUA). This EUA will remain  in effect (meaning this test can be used) for the duration of the COVID-19 declaration under Section 564(b)(1) of the Act, 21 U.S.C.section 360bbb-3(b)(1), unless the authorization is terminated  or revoked sooner.       Influenza A by PCR 06/07/2022  NEGATIVE  NEGATIVE Final   Influenza B by PCR 06/07/2022 NEGATIVE  NEGATIVE Final   Comment: (NOTE) The Xpert Xpress SARS-CoV-2/FLU/RSV plus assay is intended as an aid in the diagnosis of influenza from Nasopharyngeal swab specimens and should not be used as a sole basis for treatment. Nasal washings and aspirates are unacceptable for Xpert Xpress SARS-CoV-2/FLU/RSV testing.  Fact Sheet for Patients: BloggerCourse.com  Fact Sheet for Healthcare Providers: SeriousBroker.it  This test is not yet approved or cleared by the [place] FDA and has been authorized for detection and/or diagnosis of SARS-CoV-2 by FDA under an Emergency Use Authorization (EUA). This EUA will remain in effect (meaning this test can be used) for the duration of the COVID-19 declaration under Section 564(b)(1) of the Act, 21 U.S.C. section 360bbb-3(b)(1), unless the authorization is terminated or revoked.  Performed at [facility], [address] [place], [place] [address]    WBC 06/07/2022 5.7  4.0 - 10.5 K/uL Final   RBC 06/07/2022 4.39  3.87 - 5.11 MIL/uL Final   Hemoglobin 06/07/2022 13.2  12.0 - 15.0 g/dL Final   HCT 16/04/3709 40.4  36.0 - 46.0 % Final   MCV 06/07/2022 92.0  80.0 - 100.0 fL Final   MCH 06/07/2022 30.1  26.0 - 34.0 pg Final   MCHC 06/07/2022 32.7  30.0 - 36.0 g/dL Final   RDW 62/69/4854 12.4  11.5 - 15.5 % Final   Platelets 06/07/2022 308  150 - 400 K/uL Final   nRBC 06/07/2022 0.0  0.0 - 0.2 % Final   Neutrophils Relative % 06/07/2022 42  % Final   Neutro Abs 06/07/2022 2.4  1.7 - 7.7 K/uL Final   Lymphocytes Relative 06/07/2022 54  % Final   Lymphs Abs 06/07/2022 3.1  0.7 - 4.0 K/uL Final   Monocytes Relative 06/07/2022 4  % Final   Monocytes Absolute 06/07/2022 0.2  0.1 - 1.0 K/uL Final   Eosinophils Relative 06/07/2022 0  % Final   Eosinophils Absolute 06/07/2022 0.0  0.0 - 0.5 K/uL Final   Basophils Relative  06/07/2022 0  % Final   Basophils Absolute 06/07/2022 0.0  0.0 - 0.1 K/uL Final   Immature Granulocytes 06/07/2022 0  % Final   Abs Immature Granulocytes 06/07/2022 0.01  0.00 - 0.07 K/uL Final   Performed at [facility], [address] [place], [place] [address]   Sodium 06/07/2022 139  135 - 145 mmol/L Final   Potassium 06/07/2022 4.0  3.5 - 5.1 mmol/L Final   Chloride 06/07/2022 104  98 - 111 mmol/L Final   CO2 06/07/2022 28  22 - 32 mmol/L Final   Glucose, Bld 06/07/2022 104 (H)  70 - 99 mg/dL Final   Glucose reference range applies only to samples taken after fasting for at least 8 hours.   BUN 06/07/2022 8  6 - 20 mg/dL Final   Creatinine, Ser 06/07/2022 0.84  0.44 - 1.00 mg/dL Final   Calcium 50/06/3817 9.1  8.9 - 10.3 mg/dL Final   Total Protein 29/93/7169 6.9  6.5 - 8.1 g/dL Final   Albumin 67/89/3810 3.7  3.5 - 5.0 g/dL Final   AST 17/51/0258 19  15 - 41 U/L Final   ALT 06/07/2022 22  0 - 44 U/L Final   Alkaline Phosphatase 06/07/2022 54  38 - 126 U/L Final   Total Bilirubin 06/07/2022 0.4  0.3 - 1.2 mg/dL Final   GFR, Estimated 06/07/2022 >60  >60 mL/min Final   Comment: (NOTE) Calculated using the CKD-EPI Creatinine Equation (2021)    Anion gap 06/07/2022 7  5 - 15 Final   Performed at [facility], [address], [place], [place] [address]   Hgb A1c MFr Bld 06/07/2022 5.0  4.8 - 5.6 % Final   Comment: (NOTE) Pre diabetes:  5.7%-6.4%  Diabetes:              >6.4%  Glycemic control for   <7.0% adults with diabetes    Mean Plasma Glucose 06/07/2022 96.8  mg/dL Final   Performed at [facility], [address], [place], [place] [address]   TSH 06/07/2022 1.620  0.350 - 4.500 uIU/mL Final   Comment: Performed by a 3rd Generation assay with a functional sensitivity of <=0.01 uIU/mL. Performed at [facility], [address] [place], [place] [address]    RPR Ser Ql 06/07/2022 NON REACTIVE  NON REACTIVE Final   Performed at [facility], [address], [place], [place] [address]   Color, Urine 06/07/2022 YELLOW  YELLOW Final   APPearance 06/07/2022 HAZY (A)  CLEAR Final   Specific Gravity, Urine 06/07/2022 1.018  1.005 - 1.030 Final   pH 06/07/2022 7.0  5.0 - 8.0 Final   Glucose, UA 06/07/2022 NEGATIVE  NEGATIVE mg/dL Final   Hgb urine dipstick 06/07/2022 NEGATIVE  NEGATIVE Final   Bilirubin Urine 06/07/2022 NEGATIVE  NEGATIVE Final   Ketones, ur 06/07/2022 NEGATIVE  NEGATIVE mg/dL Final   Protein, ur 52/77/8242 NEGATIVE  NEGATIVE mg/dL Final   Nitrite 35/36/1443 NEGATIVE  NEGATIVE Final   Leukocytes,Ua 06/07/2022 NEGATIVE  NEGATIVE Final   Performed at [facility], [address] [place], [place] [address]   Cholesterol 06/07/2022 164  0 - 200 mg/dL Final   Triglycerides 86/76/1950 158 (H)  <150 mg/dL Final   HDL 93/26/7124 44  >40 mg/dL Final   Total CHOL/HDL Ratio 06/07/2022 3.7  RATIO Final   VLDL 06/07/2022 32  0 - 40 mg/dL Final   LDL Cholesterol 06/07/2022 88  0 - 99 mg/dL Final   Comment:        Total Cholesterol/HDL:CHD Risk Coronary Heart Disease Risk Table                     Men   Women  1/2 Average Risk   3.4   3.3  Average Risk       5.0   4.4  2 X Average Risk   9.6   7.1  3 X Average Risk  23.4   11.0        Use the calculated Patient Ratio above and the CHD Risk Table to determine the patient's CHD Risk.        ATP III CLASSIFICATION (LDL):  <100     mg/dL   Optimal  580-998  mg/dL   Near or Above                    Optimal  130-159  mg/dL   Borderline  338-250  mg/dL   High  >539     mg/dL   Very High Performed at [facility], [address], [place], [place] [address]    HIV Screen 4th Generation wRfx 06/07/2022 Non Reactive  Non Reactive Final   Performed at [facility], [address] [place], [place] [address]   SARSCOV2ONAVIRUS 2 AG 06/07/2022 NEGATIVE  NEGATIVE Final   Comment: (NOTE) SARS-CoV-2 antigen NOT DETECTED.   Negative results are  presumptive.  Negative results do not preclude SARS-CoV-2 infection and should not be used as the sole basis for treatment or other patient management decisions, including infection  control decisions, particularly in the presence of clinical signs and  symptoms consistent with COVID-19,  or in those who have been in contact with the virus.  Negative results must be combined with clinical observations, patient history, and epidemiological information. The expected result is Negative.  Fact Sheet for Patients: HandmadeRecipes.com.cy  Fact Sheet for Healthcare Providers: FuneralLife.at  This test is not yet approved or cleared by the [place] FDA and  has been authorized for detection and/or diagnosis of SARS-CoV-2 by FDA under an Emergency Use Authorization (EUA).  This EUA will remain in effect (meaning this test can be used) for the duration of  the COV                          ID-19 declaration under Section 564(b)(1) of the Act, 21 U.S.C. section 360bbb-3(b)(1), unless the authorization is terminated or revoked sooner.     POC Amphetamine UR 06/07/2022 None Detected  NONE DETECTED (Cut Off Level 1000 ng/mL) Final   POC Secobarbital (BAR) 06/07/2022 None Detected  NONE DETECTED (Cut Off Level 300 ng/mL) Final   POC Buprenorphine (BUP) 06/07/2022 None Detected  NONE DETECTED (Cut Off Level 10 ng/mL) Final   POC Oxazepam (BZO) 06/07/2022 None Detected  NONE DETECTED (Cut Off Level 300 ng/mL) Final   POC Cocaine UR 06/07/2022 None Detected  NONE DETECTED (Cut Off Level 300 ng/mL) Final   POC Methamphetamine UR 06/07/2022 None Detected  NONE DETECTED (Cut Off Level 1000 ng/mL) Final   POC Morphine 06/07/2022 None Detected  NONE DETECTED (Cut Off Level 300 ng/mL) Final   POC Methadone UR 06/07/2022 None Detected  NONE DETECTED (Cut Off Level 300 ng/mL) Final   POC Oxycodone UR 06/07/2022 None Detected  NONE DETECTED (Cut Off Level 100  ng/mL) Final   POC Marijuana UR 06/07/2022 None Detected  NONE DETECTED (Cut Off Level 50 ng/mL) Final   Preg Test, Ur 06/08/2022 NEGATIVE  NEGATIVE Final   Comment:        THE SENSITIVITY OF THIS METHODOLOGY IS >20 mIU/mL. Performed at [facility], [place] [address] [place], [place] [address]    Preg Test, Ur 06/08/2022 NEGATIVE  NEGATIVE Final   Comment:        THE SENSITIVITY OF THIS METHODOLOGY IS >24 mIU/mL   Admission on 02/07/2022, Discharged on 02/08/2022  Component Date Value Ref Range Status   SARS Coronavirus 2 by RT PCR 02/07/2022 NEGATIVE  NEGATIVE Final   Comment: (NOTE) SARS-CoV-2 target nucleic acids are NOT DETECTED.  The SARS-CoV-2 RNA is generally detectable in upper respiratory specimens during the acute phase of infection. The lowest concentration of SARS-CoV-2 viral copies this assay can detect is 138 copies/mL. A negative result does not preclude SARS-Cov-2 infection and should not be used as the sole basis for treatment or other patient management decisions. A negative result may occur with  improper specimen collection/handling, submission of specimen other than nasopharyngeal swab, presence of viral mutation(s) within the areas targeted by this assay, and inadequate number of viral copies(<138 copies/mL). A negative result must be combined with clinical observations, patient history, and epidemiological information. The expected result is Negative.  Fact Sheet for Patients:  EntrepreneurPulse.com.au  Fact Sheet for Healthcare Providers:  IncredibleEmployment.be  This test is no                          t yet approved or cleared by the [place] FDA and  has been authorized for detection and/or diagnosis of SARS-CoV-2 by FDA under  an Emergency Use Authorization (EUA). This EUA will remain  in effect (meaning this test can be used) for the duration of the COVID-19 declaration under Section 564(b)(1) of the  Act, 21 U.S.C.section 360bbb-3(b)(1), unless the authorization is terminated  or revoked sooner.       Influenza A by PCR 02/07/2022 NEGATIVE  NEGATIVE Final   Influenza B by PCR 02/07/2022 NEGATIVE  NEGATIVE Final   Comment: (NOTE) The Xpert Xpress SARS-CoV-2/FLU/RSV plus assay is intended as an aid in the diagnosis of influenza from Nasopharyngeal swab specimens and should not be used as a sole basis for treatment. Nasal washings and aspirates are unacceptable for Xpert Xpress SARS-CoV-2/FLU/RSV testing.  Fact Sheet for Patients: BloggerCourse.com  Fact Sheet for Healthcare Providers: SeriousBroker.it  This test is not yet approved or cleared by the [place] FDA and has been authorized for detection and/or diagnosis of SARS-CoV-2 by FDA under an Emergency Use Authorization (EUA). This EUA will remain in effect (meaning this test can be used) for the duration of the COVID-19 declaration under Section 564(b)(1) of the Act, 21 U.S.C. section 360bbb-3(b)(1), unless the authorization is terminated or revoked.  Performed at [facility], [address], [place], [place] [address]    WBC 02/07/2022 8.1  4.0 - 10.5 K/uL Final   RBC 02/07/2022 4.55  3.87 - 5.11 MIL/uL Final   Hemoglobin 02/07/2022 13.6  12.0 - 15.0 g/dL Final   HCT 86/57/8469 41.9  36.0 - 46.0 % Final   MCV 02/07/2022 92.1  80.0 - 100.0 fL Final   MCH 02/07/2022 29.9  26.0 - 34.0 pg Final   MCHC 02/07/2022 32.5  30.0 - 36.0 g/dL Final   RDW 62/95/2841 12.9  11.5 - 15.5 % Final   Platelets 02/07/2022 286  150 - 400 K/uL Final   nRBC 02/07/2022 0.0  0.0 - 0.2 % Final   Neutrophils Relative % 02/07/2022 41  % Final   Neutro Abs 02/07/2022 3.3  1.7 - 7.7 K/uL Final   Lymphocytes Relative 02/07/2022 53  % Final   Lymphs Abs 02/07/2022 4.3 (H)  0.7 - 4.0 K/uL Final   Monocytes Relative 02/07/2022 6  % Final   Monocytes Absolute 02/07/2022 0.5  0.1 - 1.0  K/uL Final   Eosinophils Relative 02/07/2022 0  % Final   Eosinophils Absolute 02/07/2022 0.0  0.0 - 0.5 K/uL Final   Basophils Relative 02/07/2022 0  % Final   Basophils Absolute 02/07/2022 0.0  0.0 - 0.1 K/uL Final   Immature Granulocytes 02/07/2022 0  % Final   Abs Immature Granulocytes 02/07/2022 0.02  0.00 - 0.07 K/uL Final   Performed at [facility], [address], [place], [place] [address]   Sodium 02/07/2022 137  135 - 145 mmol/L Final   Potassium 02/07/2022 4.1  3.5 - 5.1 mmol/L Final   Chloride 02/07/2022 104  98 - 111 mmol/L Final   CO2 02/07/2022 29  22 - 32 mmol/L Final   Glucose, Bld 02/07/2022 92  70 - 99 mg/dL Final   Glucose reference range applies only to samples taken after fasting for at least 8 hours.   BUN 02/07/2022 10  6 - 20 mg/dL Final   Creatinine, Ser 02/07/2022 0.88  0.44 - 1.00 mg/dL Final   Calcium 08/10/2535 9.4  8.9 - 10.3 mg/dL Final   Total Protein 64/40/3474 7.2  6.5 - 8.1 g/dL Final   Albumin 25/95/6387 3.9  3.5 - 5.0 g/dL Final   AST  02/07/2022 18  15 - 41 U/L Final   ALT 02/07/2022 25  0 - 44 U/L Final   Alkaline Phosphatase 02/07/2022 66  38 - 126 U/L Final   Total Bilirubin 02/07/2022 0.2 (L)  0.3 - 1.2 mg/dL Final   GFR, Estimated 02/07/2022 >60  >60 mL/min Final   Comment: (NOTE) Calculated using the CKD-EPI Creatinine Equation (2021)    Anion gap 02/07/2022 4 (L)  5 - 15 Final   Performed at [facility], [address] [place], [place] [address]   Hgb A1c MFr Bld 02/07/2022 5.0  4.8 - 5.6 % Final   Comment: (NOTE) Pre diabetes:          5.7%-6.4%  Diabetes:              >6.4%  Glycemic control for   <7.0% adults with diabetes    Mean Plasma Glucose 02/07/2022 96.8  mg/dL Final   Performed at [facility], [address] [place], [place] [address]   Alcohol, Ethyl (B) 02/07/2022 <10  <10 mg/dL Final   Comment: (NOTE) Lowest detectable limit for serum alcohol is 10 mg/dL.  For medical purposes  only. Performed at [facility], [address] [place], [place] [address]    Cholesterol 02/07/2022 192  0 - 200 mg/dL Final   Triglycerides 08/10/2535 192 (H)  <150 mg/dL Final   HDL 64/40/3474 46  >40 mg/dL Final   Total CHOL/HDL Ratio 02/07/2022 4.2  RATIO Final   VLDL 02/07/2022 38  0 - 40 mg/dL Final   LDL Cholesterol 02/07/2022 108 (H)  0 - 99 mg/dL Final   Comment:        Total Cholesterol/HDL:CHD Risk Coronary Heart Disease Risk Table                     Men   Women  1/2 Average Risk   3.4   3.3  Average Risk       5.0   4.4  2 X Average Risk   9.6   7.1  3 X Average Risk  23.4   11.0        Use the calculated Patient Ratio above and the CHD Risk Table to determine the patient's CHD Risk.        ATP III CLASSIFICATION (LDL):  <100     mg/dL   Optimal  259-563  mg/dL   Near or Above                    Optimal  130-159  mg/dL   Borderline  875-643  mg/dL   High  >329     mg/dL   Very High Performed at [facility], [address] [place], [place] [address]    TSH 02/07/2022 6.344 (H)  0.350 - 4.500 uIU/mL Final   Comment: Performed by a 3rd Generation assay with a functional sensitivity of <=0.01 uIU/mL. Performed at [facility], [address], [place], [place] [address]    POC Amphetamine UR 02/07/2022 None Detected  NONE DETECTED (Cut Off Level 1000 ng/mL) Final   POC Secobarbital (BAR) 02/07/2022 None Detected  NONE DETECTED (Cut Off Level 300 ng/mL) Final   POC Buprenorphine (BUP) 02/07/2022 None Detected  NONE DETECTED (Cut Off Level 10 ng/mL) Final   POC Oxazepam (BZO) 02/07/2022 None Detected  NONE DETECTED (Cut Off Level 300 ng/mL) Final   POC Cocaine UR 02/07/2022 None Detected  NONE DETECTED (Cut Off Level 300 ng/mL) Final   POC Methamphetamine UR 02/07/2022 None Detected  NONE DETECTED (Cut Off Level 1000 ng/mL) Final   POC Morphine 02/07/2022 None Detected  NONE DETECTED (Cut Off Level 300 ng/mL) Final   POC Oxycodone UR 02/07/2022  None Detected  NONE DETECTED (Cut Off Level 100 ng/mL) Final   POC Methadone UR 02/07/2022 None Detected  NONE DETECTED (Cut Off Level 300 ng/mL) Final   POC Marijuana UR 02/07/2022 None Detected  NONE DETECTED (Cut Off Level 50 ng/mL) Final   SARSCOV2ONAVIRUS 2 AG 02/07/2022 NEGATIVE  NEGATIVE Final   Comment: (NOTE) SARS-CoV-2 antigen NOT DETECTED.   Negative results are presumptive.  Negative results do not preclude SARS-CoV-2 infection and should not be used as the sole basis for treatment or other patient management decisions, including infection  control decisions, particularly in the presence of clinical signs and  symptoms consistent with COVID-19, or in those who have been in contact with the virus.  Negative results must be combined with clinical observations, patient history, and epidemiological information. The expected result is Negative.  Fact Sheet for Patients: https://www.jennings-kim.com/  Fact Sheet for Healthcare Providers: https://alexander-rogers.biz/  This test is not yet approved or cleared by the [place] FDA and  has been authorized for detection and/or diagnosis of SARS-CoV-2 by FDA under an Emergency Use Authorization (EUA).  This EUA will remain in effect (meaning this test can be used) for the duration of  the COV                          ID-19 declaration under Section 564(b)(1) of the Act, 21 U.S.C. section 360bbb-3(b)(1), unless the authorization is terminated or revoked sooner.     Preg Test, Ur 02/07/2022 NEGATIVE  NEGATIVE Final   Comment:        THE SENSITIVITY OF THIS METHODOLOGY IS >24 mIU/mL     Blood Alcohol level:  Lab Results  Component Value Date   ETH <10 07/04/2022   ETH <10 02/07/2022    Metabolic Disorder Labs: Lab Results  Component Value Date   HGBA1C 5.0 06/07/2022   MPG 96.8 06/07/2022   MPG 96.8 02/07/2022   No results found for: "PROLACTIN" Lab Results  Component Value Date   CHOL  181 07/25/2022   TRIG 118 07/25/2022   HDL 45 07/25/2022   CHOLHDL 4.0 07/25/2022   VLDL 24 07/25/2022   LDLCALC 112 (H) 07/25/2022   LDLCALC 88 06/07/2022    Therapeutic Lab Levels: No results found for: "LITHIUM" No results found for: "VALPROATE" No results found for: "CBMZ"  Physical Findings   GAD-7    Flowsheet Row [facility]/17/2021 [facility]  Total GAD-7 Score 17      PHQ2-9    Flowsheet Row ED from 06/07/2022 in [facility] Office Visit from 12/17/2021 in [facility]  PHQ-2 Total Score 1 2  PHQ-9 Total Score 2 8      Flowsheet Row ED from 07/25/2022 in [facility] ED from 07/04/2022 in [facility] ED from 06/14/2022 in [facility]   C-SSRS RISK CATEGORY High Risk No Risk No Risk        Musculoskeletal  Strength & Muscle Tone: within normal limits Gait & Station: normal Patient leans: N/A  Psychiatric Specialty Exam  Presentation  General  Appearance:  Appropriate for Environment; Casual; Fairly Groomed  Eye Contact: Good  Speech: Clear and Coherent; Normal Rate  Speech Volume: Decreased  Handedness: Right   Mood and Affect  Mood: Anxious  Affect: Appropriate; Congruent; Full Range   Thought Process  Thought Processes: Coherent; Goal Directed; Linear  Descriptions of Associations:Intact  Orientation:Full (Time, Place and Person)  Thought Content:Logical; WDL  Diagnosis of Schizophrenia or Schizoaffective disorder in past: Yes    Hallucinations:Hallucinations: None  Ideas of Reference:None  Suicidal Thoughts:Suicidal Thoughts: No (Contracted to safety)  Homicidal Thoughts:Homicidal Thoughts: No   Sensorium  Memory: Immediate Good  Judgment: Fair  Insight: Fair   Executive Functions  Concentration: Good  Attention  Span: Good  Recall: Good  Fund of Knowledge: Good  Language: Good   Psychomotor Activity  Psychomotor Activity: Psychomotor Activity: Normal   Assets  Assets: Communication Skills; Desire for Improvement   Sleep  Sleep: Sleep: Good  Physical Exam  Physical Exam Vitals and nursing note reviewed.  Constitutional:      General: She is not in acute distress.    Appearance: She is not ill-appearing or diaphoretic.  HENT:     Head: Normocephalic and atraumatic.  Pulmonary:     Effort: Pulmonary effort is normal. No respiratory distress.  Neurological:     General: No focal deficit present.     Mental Status: She is alert.     Blood pressure 96/73, pulse (!) 102, temperature 98.3 F (36.8 C), temperature source Oral, resp. rate 18, SpO2 100 %. There is no height or weight on file to calculate BMI.  [facility]   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  Signed: Princess BruinsJulie Sarayu Prevost, DO Psychiatry Resident, PGY-2 07/31/2022, 10:26 AM  [facility] [address] [place] [id number] Dept: [phone] Dept Fax: [phone]

## 2022-07-31 NOTE — ED Notes (Signed)
Patient watching TV quietly. Respirations equal and unlabored, skin warm and dry, NAD. Routine safety checks conducted according to facility protocol. Will continue to monitor for safety.  

## 2022-08-01 LAB — T3, FREE: T3, Free: 2.3 pg/mL (ref 2.0–4.4)

## 2022-08-01 MED ORDER — LEVOTHYROXINE SODIUM 25 MCG PO TABS
25.0000 ug | ORAL_TABLET | Freq: Every day | ORAL | Status: DC
  Administered 2022-08-01 – 2022-08-02 (×2): 25 ug via ORAL
  Filled 2022-08-01 (×2): qty 1

## 2022-08-01 MED ORDER — SENNOSIDES-DOCUSATE SODIUM 8.6-50 MG PO TABS
1.0000 | ORAL_TABLET | Freq: Two times a day (BID) | ORAL | Status: DC
  Administered 2022-08-01 – 2022-08-07 (×13): 1 via ORAL
  Filled 2022-08-01 (×12): qty 1

## 2022-08-01 NOTE — ED Provider Notes (Signed)
Behavioral Health Progress Note  Date and Time: 08/01/2022 10:00 AM Name: Nicole [name] MRN:  [id number]   HPI: Phynix Horton is a 31 y.o. female, with PMH of SCZ unspecified, MDD with psychosis, who presented voluntary to [facility] (07/25/2022) via [facility] after a verbal altercation with [name] (not patient's legal guardian) for transient SI.  This is patient's fourth visit to [facility] for similar concerns this year. Patient has been dismissed from previous group home due to threatening SI and HI, then leaving the group home.   Legal Guardian: Mom (Maxine Cobb) Point of contact: [name]) APS is following.   Subjective:  Patient was initially seen this AM awake, sitting up in bed, comfortable, no acute distress. Patient stated that she is fat and inquired if this Thereasa Parkin would prescribe her ozempic. Stated that was on it in the past. Also brought up concerns of a nightmare last night about people getting shot. Discussed that trazodone has the potential to cause trazodone, and patient stated that has never been the case for her, stated she would like to continue her trazodone.  Inquired about when she uses her nicotine patch, stated she keeps it on all day and all night.  Discussed with her to trial removing it in the evening, to see if she still has nightmares.  Patient denied these nightmares being past events.  She also stated that her dad called yesterday, stated that he is going to be faxing guardianship papers today.  She reported that her symptoms of reflux are still present, however unchanged.  Discussed with her to wait longer before laying down after eating, and to possibly incline her head to prevent reflux.  Patient also brought up that she is constipated, and was amenable to scheduled stool softener.  [id number] Thoughts: No (Contracted to safety) SI Active Intent and/or Plan:  (Denied) [id number] Thoughts: No HI Active Intent and/or Plan:  (Denied) HI Passive Intent and/or  Plan:  (Denied) [id number]: None Description of Auditory Hallucinations: Denied  Mood: Euthymic Sleep:Good (feels restful, had nightmare) Appetite: Good  Review of Systems  Respiratory:  Negative for shortness of breath.   Cardiovascular:  Negative for chest pain.  Gastrointestinal:  Negative for nausea and vomiting.  Neurological:  Negative for dizziness and headaches.    Diagnosis:  Final diagnoses:  Severe episode of recurrent major depressive disorder, with psychotic features (HCC)    Total Time spent with patient: 20 minutes  Past Psychiatric History: schizophrenia, schizoaffective disorder, MDD, suicidal ideation Past Medical History:  Past Medical History:  Diagnosis Date   Anxiety    Depression     Past Surgical History:  Procedure Laterality Date   WISDOM TOOTH EXTRACTION Bilateral 2020   Family History:  Family History  Problem Relation Age of Onset   Hypertension Father    Diabetes Father    Family Psychiatric  History: none reported Social History:  Social History   Substance and Sexual Activity  Alcohol Use Never     Social History   Substance and Sexual Activity  Drug Use Never    Social History   Socioeconomic History   Marital status: Single    Spouse name: Not on file   Number of children: Not on file   Years of education: Not on file   Highest education level: Not on file  Occupational History   Not on file  Tobacco Use   Smoking status: Every Day    Types: Cigarettes   Smokeless tobacco: Never  Vaping Use  Vaping Use: Some days  Substance and Sexual Activity   Alcohol use: Never   Drug use: Never   Sexual activity: Yes    Partners: Female    Birth control/protection: Implant  Other Topics Concern   Not on file  Social History Narrative   Not on file   Social Determinants of Health   Financial Resource Strain: Not on file  Food Insecurity: Not on file  Transportation Needs: Not on file  Physical Activity: Not  on file  Stress: Not on file  Social Connections: Not on file   SDOH:  SDOH Screenings   Depression (PHQ2-9): Low Risk  (06/10/2022)  Tobacco Use: High Risk (07/04/2022)   Additional Social History:    Pain Medications: SEE MAR Prescriptions: SEE MAR Over the Counter: SEE MAR History of alcohol / drug use?: Yes Longest period of sobriety (when/how long): N/A Negative Consequences of Use:  (NONE) Withdrawal Symptoms: None Name of Substance 1: ETOH IN PAST--PT CURRENTLY DENIES 1 - Frequency: SOCIAL USE ETOH IN PAST 1 - Method of Aquiring: LEGAL 1- Route of Use: ORAL DRINK Name of Substance 2: NICOTINE-VAPE AND CIGARETTES 2 - Amount (size/oz): LESS THAN ONE PACK 2 - Frequency: DAILY 2 - Method of Aquiring: LEGAL 2 - Route of Substance Use: ORAL SMOKE                Current Medications:  Current Facility-Administered Medications  Medication Dose Route Frequency Provider Last Rate Last Admin   acetaminophen (TYLENOL) tablet 650 mg  650 mg Oral Q6H PRN Onuoha, Chinwendu V, NP   650 mg at 07/31/22 1328   alum & mag hydroxide-simeth (MAALOX/MYLANTA) 200-200-20 MG/5ML suspension 30 mL  30 mL Oral Q4H PRN Onuoha, Chinwendu V, NP   30 mL at 07/27/22 2151   clotrimazole (LOTRIMIN) 1 % cream   Topical BID Bobbitt, Shalon E, NP   Given at 08/01/22 0925   hydrOXYzine (ATARAX) tablet 25 mg  25 mg Oral TID PRN Onuoha, Chinwendu V, NP   25 mg at 07/30/22 2010   levothyroxine (SYNTHROID) tablet 25 mcg  25 mcg Oral Q0600 Princess Bruins, DO       magnesium hydroxide (MILK OF MAGNESIA) suspension 30 mL  30 mL Oral Daily PRN Onuoha, Chinwendu V, NP       nicotine (NICODERM CQ - dosed in mg/24 hours) patch 14 mg  14 mg Transdermal Q0600 Onuoha, Chinwendu V, NP   14 mg at 08/01/22 0922   ondansetron (ZOFRAN) tablet 4 mg  4 mg Oral Once Nwoko, Uchenna E, PA       Oxcarbazepine (TRILEPTAL) tablet 300 mg  300 mg Oral BID Onuoha, Chinwendu V, NP   300 mg at 08/01/22 0923   pantoprazole (PROTONIX) EC  tablet 20 mg  20 mg Oral Daily Princess Bruins, DO   20 mg at 08/01/22 0923   QUEtiapine (SEROQUEL) tablet 400 mg  400 mg Oral BID Onuoha, Chinwendu V, NP   400 mg at 08/01/22 1610   senna-docusate (Senokot-S) tablet 1 tablet  1 tablet Oral BID Princess Bruins, DO       sertraline (ZOLOFT) tablet 150 mg  150 mg Oral Daily Onuoha, Chinwendu V, NP   150 mg at 08/01/22 0923   traZODone (DESYREL) tablet 50 mg  50 mg Oral QHS PRN Onuoha, Chinwendu V, NP   50 mg at 07/31/22 2113   valACYclovir (VALTREX) tablet 500 mg  500 mg Oral Daily Princess Bruins, DO   500 mg at 08/01/22  0923   Current Outpatient Medications  Medication Sig Dispense Refill   ABILIFY MAINTENA 400 MG PRSY prefilled syringe 400 mg every 28 (twenty-eight) days.     cetirizine (ZYRTEC) 10 MG tablet Take 10 mg by mouth daily.     cyclobenzaprine (FLEXERIL) 10 MG tablet Take 1 tablet (10 mg total) by mouth 2 (two) times daily as needed for muscle spasms. 20 tablet 0   fluticasone (FLONASE) 50 MCG/ACT nasal spray Place 1 spray into both nostrils daily.     hydrOXYzine (ATARAX) 50 MG tablet Take 1 tablet (50 mg total) by mouth 2 (two) times daily. 60 tablet 0   meloxicam (MOBIC) 15 MG tablet Take 15 mg by mouth daily.     Oxcarbazepine (TRILEPTAL) 300 MG tablet Take 1 tablet (300 mg total) by mouth 2 (two) times daily. 60 tablet 0   QUEtiapine (SEROQUEL) 400 MG tablet Take 1 tablet (400 mg total) by mouth 2 (two) times daily. 60 tablet 0   sertraline (ZOLOFT) 50 MG tablet Take 3 tablets (150 mg total) by mouth in the morning. 90 tablet 0   traZODone (DESYREL) 100 MG tablet Take 1 tablet (100 mg total) by mouth at bedtime. 30 tablet 0   valACYclovir (VALTREX) 500 MG tablet Take 500 mg by mouth daily.      Labs  Lab Results:  Admission on 07/25/2022  Component Date Value Ref Range Status   SARS Coronavirus 2 by RT PCR 07/25/2022 NEGATIVE  NEGATIVE Final   Comment: (NOTE) SARS-CoV-2 target nucleic acids are NOT DETECTED.  The SARS-CoV-2  RNA is generally detectable in upper respiratory specimens during the acute phase of infection. The lowest concentration of SARS-CoV-2 viral copies this assay can detect is 138 copies/mL. A negative result does not preclude SARS-Cov-2 infection and should not be used as the sole basis for treatment or other patient management decisions. A negative result may occur with  improper specimen collection/handling, submission of specimen other than nasopharyngeal swab, presence of viral mutation(s) within the areas targeted by this assay, and inadequate number of viral copies(<138 copies/mL). A negative result must be combined with clinical observations, patient history, and epidemiological information. The expected result is Negative.  Fact Sheet for Patients:  EntrepreneurPulse.com.au  Fact Sheet for Healthcare Providers:  IncredibleEmployment.be  This test is no                          t yet approved or cleared by the [place] FDA and  has been authorized for detection and/or diagnosis of SARS-CoV-2 by FDA under an Emergency Use Authorization (EUA). This EUA will remain  in effect (meaning this test can be used) for the duration of the COVID-19 declaration under Section 564(b)(1) of the Act, 21 U.S.C.section 360bbb-3(b)(1), unless the authorization is terminated  or revoked sooner.       Influenza A by PCR 07/25/2022 NEGATIVE  NEGATIVE Final   Influenza B by PCR 07/25/2022 NEGATIVE  NEGATIVE Final   Comment: (NOTE) The Xpert Xpress SARS-CoV-2/FLU/RSV plus assay is intended as an aid in the diagnosis of influenza from Nasopharyngeal swab specimens and should not be used as a sole basis for treatment. Nasal washings and aspirates are unacceptable for Xpert Xpress SARS-CoV-2/FLU/RSV testing.  Fact Sheet for Patients: EntrepreneurPulse.com.au  Fact Sheet for Healthcare  Providers: IncredibleEmployment.be  This test is not yet approved or cleared by the [place] FDA and has been authorized for detection and/or diagnosis of SARS-CoV-2 by FDA  under an Emergency Use Authorization (EUA). This EUA will remain in effect (meaning this test can be used) for the duration of the COVID-19 declaration under Section 564(b)(1) of the Act, 21 U.S.C. section 360bbb-3(b)(1), unless the authorization is terminated or revoked.  Performed at [facility], [address], [place], [place] [address]    WBC 07/25/2022 8.3  4.0 - 10.5 K/uL Final   RBC 07/25/2022 4.44  3.87 - 5.11 MIL/uL Final   Hemoglobin 07/25/2022 13.7  12.0 - 15.0 g/dL Final   HCT 60/45/4098 40.2  36.0 - 46.0 % Final   MCV 07/25/2022 90.5  80.0 - 100.0 fL Final   MCH 07/25/2022 30.9  26.0 - 34.0 pg Final   MCHC 07/25/2022 34.1  30.0 - 36.0 g/dL Final   RDW 11/91/4782 12.2  11.5 - 15.5 % Final   Platelets 07/25/2022 248  150 - 400 K/uL Final   nRBC 07/25/2022 0.0  0.0 - 0.2 % Final   Neutrophils Relative % 07/25/2022 43  % Final   Neutro Abs 07/25/2022 3.6  1.7 - 7.7 K/uL Final   Lymphocytes Relative 07/25/2022 52  % Final   Lymphs Abs 07/25/2022 4.2 (H)  0.7 - 4.0 K/uL Final   Monocytes Relative 07/25/2022 5  % Final   Monocytes Absolute 07/25/2022 0.4  0.1 - 1.0 K/uL Final   Eosinophils Relative 07/25/2022 0  % Final   Eosinophils Absolute 07/25/2022 0.0  0.0 - 0.5 K/uL Final   Basophils Relative 07/25/2022 0  % Final   Basophils Absolute 07/25/2022 0.0  0.0 - 0.1 K/uL Final   Immature Granulocytes 07/25/2022 0  % Final   Abs Immature Granulocytes 07/25/2022 0.02  0.00 - 0.07 K/uL Final   Performed at [facility], [address] [place], [place] [address]   Sodium 07/25/2022 138  135 - 145 mmol/L Final   Potassium 07/25/2022 4.0  3.5 - 5.1 mmol/L Final   Chloride 07/25/2022 104  98 - 111 mmol/L Final   CO2 07/25/2022 29  22 - 32 mmol/L Final   Glucose, Bld  07/25/2022 83  70 - 99 mg/dL Final   Glucose reference range applies only to samples taken after fasting for at least 8 hours.   BUN 07/25/2022 11  6 - 20 mg/dL Final   Creatinine, Ser 07/25/2022 0.97  0.44 - 1.00 mg/dL Final   Calcium 30/86/5784 9.2  8.9 - 10.3 mg/dL Final   Total Protein 69/62/9528 7.0  6.5 - 8.1 g/dL Final   Albumin 41/32/4401 3.8  3.5 - 5.0 g/dL Final   AST 02/72/5366 18  15 - 41 U/L Final   ALT 07/25/2022 22  0 - 44 U/L Final   Alkaline Phosphatase 07/25/2022 64  38 - 126 U/L Final   Total Bilirubin 07/25/2022 0.2 (L)  0.3 - 1.2 mg/dL Final   GFR, Estimated 07/25/2022 >60  >60 mL/min Final   Comment: (NOTE) Calculated using the CKD-EPI Creatinine Equation (2021)    Anion gap 07/25/2022 5  5 - 15 Final   Performed at [facility], [address], [place], [place] [address]   POC Amphetamine UR 07/25/2022 None Detected  NONE DETECTED (Cut Off Level 1000 ng/mL) Preliminary   POC Secobarbital (BAR) 07/25/2022 None Detected  NONE DETECTED (Cut Off Level 300 ng/mL) Preliminary   POC Buprenorphine (BUP) 07/25/2022 None Detected  NONE DETECTED (Cut Off Level 10 ng/mL) Preliminary   POC Oxazepam (BZO) 07/25/2022 None Detected  NONE DETECTED (Cut  Off Level 300 ng/mL) Preliminary   POC Cocaine UR 07/25/2022 None Detected  NONE DETECTED (Cut Off Level 300 ng/mL) Preliminary   POC Methamphetamine UR 07/25/2022 None Detected  NONE DETECTED (Cut Off Level 1000 ng/mL) Preliminary   POC Morphine 07/25/2022 None Detected  NONE DETECTED (Cut Off Level 300 ng/mL) Preliminary   POC Methadone UR 07/25/2022 None Detected  NONE DETECTED (Cut Off Level 300 ng/mL) Preliminary   POC Oxycodone UR 07/25/2022 None Detected  NONE DETECTED (Cut Off Level 100 ng/mL) Preliminary   POC Marijuana UR 07/25/2022 None Detected  NONE DETECTED (Cut Off Level 50 ng/mL) Preliminary   SARSCOV2ONAVIRUS 2 AG 07/25/2022 NEGATIVE  NEGATIVE Final   Comment: (NOTE) SARS-CoV-2 antigen NOT DETECTED.    Negative results are presumptive.  Negative results do not preclude SARS-CoV-2 infection and should not be used as the sole basis for treatment or other patient management decisions, including infection  control decisions, particularly in the presence of clinical signs and  symptoms consistent with COVID-19, or in those who have been in contact with the virus.  Negative results must be combined with clinical observations, patient history, and epidemiological information. The expected result is Negative.  Fact Sheet for Patients: https://www.jennings-kim.com/  Fact Sheet for Healthcare Providers: https://alexander-rogers.biz/  This test is not yet approved or cleared by the [place] FDA and  has been authorized for detection and/or diagnosis of SARS-CoV-2 by FDA under an Emergency Use Authorization (EUA).  This EUA will remain in effect (meaning this test can be used) for the duration of  the COV                          ID-19 declaration under Section 564(b)(1) of the Act, 21 U.S.C. section 360bbb-3(b)(1), unless the authorization is terminated or revoked sooner.     Cholesterol 07/25/2022 181  0 - 200 mg/dL Final   Triglycerides 33/29/5188 118  <150 mg/dL Final   HDL 41/66/0630 45  >40 mg/dL Final   Total CHOL/HDL Ratio 07/25/2022 4.0  RATIO Final   VLDL 07/25/2022 24  0 - 40 mg/dL Final   LDL Cholesterol 07/25/2022 112 (H)  0 - 99 mg/dL Final   Comment:        Total Cholesterol/HDL:CHD Risk Coronary Heart Disease Risk Table                     Men   Women  1/2 Average Risk   3.4   3.3  Average Risk       5.0   4.4  2 X Average Risk   9.6   7.1  3 X Average Risk  23.4   11.0        Use the calculated Patient Ratio above and the CHD Risk Table to determine the patient's CHD Risk.        ATP III CLASSIFICATION (LDL):  <100     mg/dL   Optimal  160-109  mg/dL   Near or Above                    Optimal  130-159  mg/dL   Borderline  323-557   mg/dL   High  >322     mg/dL   Very High Performed at [facility], [address], [place], [place] [address]    TSH 07/25/2022 6.668 (H)  0.350 - 4.500 uIU/mL Final   Comment: Performed by a 3rd Generation assay  with a functional sensitivity of <=0.01 uIU/mL. Performed at [facility], [address], [place], [place] [address]    Glucose-Capillary 07/26/2022 104 (H)  70 - 99 mg/dL Final   Glucose reference range applies only to samples taken after fasting for at least 8 hours.   T3, Free 07/31/2022 2.3  2.0 - 4.4 pg/mL Final   Comment: (NOTE) Performed At: [facility] [address] [place], [place] [id number] Jolene Schimke MD [id number]    Free T4 07/31/2022 0.60 (L)  0.61 - 1.12 ng/dL Final   Comment: (NOTE) Biotin ingestion may interfere with free T4 tests. If the results are inconsistent with the TSH level, previous test results, or the clinical presentation, then consider biotin interference. If needed, order repeat testing after stopping biotin. Performed at [facility], [address], [place], [place] [address]   Admission on 07/04/2022, Discharged on 07/05/2022  Component Date Value Ref Range Status   Sodium 07/04/2022 142  135 - 145 mmol/L Final   Potassium 07/04/2022 4.4  3.5 - 5.1 mmol/L Final   Chloride 07/04/2022 109  98 - 111 mmol/L Final   CO2 07/04/2022 26  22 - 32 mmol/L Final   Glucose, Bld 07/04/2022 96  70 - 99 mg/dL Final   Glucose reference range applies only to samples taken after fasting for at least 8 hours.   BUN 07/04/2022 15  6 - 20 mg/dL Final   Creatinine, Ser 07/04/2022 0.83  0.44 - 1.00 mg/dL Final   Calcium 78/46/9629 9.3  8.9 - 10.3 mg/dL Final   Total Protein 52/84/1324 7.2  6.5 - 8.1 g/dL Final   Albumin 40/07/2724 3.9  3.5 - 5.0 g/dL Final   AST 36/64/4034 23  15 - 41 U/L Final   ALT 07/04/2022 28  0 - 44 U/L Final   Alkaline Phosphatase 07/04/2022 77  38 - 126 U/L Final   Total Bilirubin 07/04/2022 0.4   0.3 - 1.2 mg/dL Final   GFR, Estimated 07/04/2022 >60  >60 mL/min Final   Comment: (NOTE) Calculated using the CKD-EPI Creatinine Equation (2021)    Anion gap 07/04/2022 7  5 - 15 Final   Performed at [facility], [address] [place], [place] [address]   Alcohol, Ethyl (B) 07/04/2022 <10  <10 mg/dL Final   Comment: (NOTE) Lowest detectable limit for serum alcohol is 10 mg/dL.  For medical purposes only. Performed at [facility], [address] [place], [place] [address]    WBC 07/04/2022 7.0  4.0 - 10.5 K/uL Final   RBC 07/04/2022 4.18  3.87 - 5.11 MIL/uL Final   Hemoglobin 07/04/2022 12.8  12.0 - 15.0 g/dL Final   HCT 56/43/3295 39.1  36.0 - 46.0 % Final   MCV 07/04/2022 93.5  80.0 - 100.0 fL Final   MCH 07/04/2022 30.6  26.0 - 34.0 pg Final   MCHC 07/04/2022 32.7  30.0 - 36.0 g/dL Final   RDW 18/84/1660 12.9  11.5 - 15.5 % Final   Platelets 07/04/2022 243  150 - 400 K/uL Final   nRBC 07/04/2022 0.0  0.0 - 0.2 % Final   Neutrophils Relative % 07/04/2022 43  % Final   Neutro Abs 07/04/2022 3.0  1.7 - 7.7 K/uL Final   Lymphocytes Relative 07/04/2022 50  % Final   Lymphs Abs 07/04/2022 3.5  0.7 - 4.0 K/uL Final   Monocytes Relative 07/04/2022 7  % Final   Monocytes Absolute 07/04/2022 0.5  0.1 - 1.0 K/uL Final   Eosinophils Relative 07/04/2022 0  % Final   Eosinophils Absolute 07/04/2022 0.0  0.0 - 0.5 K/uL Final   Basophils Relative 07/04/2022 0  % Final   Basophils Absolute 07/04/2022 0.0  0.0 - 0.1 K/uL Final   Immature Granulocytes 07/04/2022 0  % Final   Abs Immature Granulocytes 07/04/2022 0.01  0.00 - 0.07 K/uL Final   Performed at [facility], [address], [place], [place] [address]   I-stat hCG, quantitative 07/04/2022 <5.0  <5 mIU/mL Final   Comment 3 07/04/2022          Final   Comment:   GEST. AGE      CONC.  (mIU/mL)   <=1 WEEK        5 - 50     2 WEEKS       50 - 500     3 WEEKS       100 -  10,000     4 WEEKS     1,000 - 30,000        FEMALE AND NON-PREGNANT FEMALE:     LESS THAN 5 mIU/mL   Admission on 06/14/2022, Discharged on 06/14/2022  Component Date Value Ref Range Status   Color, UA 06/14/2022 yellow  yellow Final   Clarity, UA 06/14/2022 cloudy (A)  clear Final   Glucose, UA 06/14/2022 negative  negative mg/dL Final   Bilirubin, UA 09/81/1914 negative  negative Final   Ketones, POC UA 06/14/2022 negative  negative mg/dL Final   Spec Grav, UA 78/29/5621 1.025  1.010 - 1.025 Final   Blood, UA 06/14/2022 negative  negative Final   pH, UA 06/14/2022 7.5  5.0 - 8.0 Final   Protein Ur, POC 06/14/2022 =30 (A)  negative mg/dL Final   Urobilinogen, UA 06/14/2022 0.2  0.2 or 1.0 E.U./dL Final   Nitrite, UA 30/86/5784 Negative  Negative Final   Leukocytes, UA 06/14/2022 Negative  Negative Final   Preg Test, Ur 06/14/2022 Negative  Negative Final   Specimen Description 06/14/2022 URINE, CLEAN CATCH   Final   Special Requests 06/14/2022    Final                   Value:NONE Performed at [facility], [address], [place], [place] [address]    Culture 06/14/2022 MULTIPLE SPECIES PRESENT, SUGGEST RECOLLECTION (A)   Final   Report Status 06/14/2022 06/16/2022 FINAL   Final  Admission on 06/07/2022, Discharged on 06/10/2022  Component Date Value Ref Range Status   SARS Coronavirus 2 by RT PCR 06/07/2022 NEGATIVE  NEGATIVE Final   Comment: (NOTE) SARS-CoV-2 target nucleic acids are NOT DETECTED.  The SARS-CoV-2 RNA is generally detectable in upper respiratory specimens during the acute phase of infection. The lowest concentration of SARS-CoV-2 viral copies this assay can detect is 138 copies/mL. A negative result does not preclude SARS-Cov-2 infection and should not be used as the sole basis for treatment or other patient management decisions. A negative result may occur with  improper specimen collection/handling, submission of specimen other than nasopharyngeal  swab, presence of viral mutation(s) within the areas targeted by this assay, and inadequate number of viral copies(<138 copies/mL). A negative result must be combined with clinical observations, patient history, and epidemiological information. The expected result is Negative.  Fact Sheet for Patients:  BloggerCourse.com  Fact Sheet for Healthcare Providers:  SeriousBroker.it  This test is no  t yet approved or cleared by the [place] and  has been authorized for detection and/or diagnosis of SARS-CoV-2 by FDA under an Emergency Use Authorization (EUA). This EUA will remain  in effect (meaning this test can be used) for the duration of the COVID-19 declaration under Section 564(b)(1) of the Act, 21 U.S.C.section 360bbb-3(b)(1), unless the authorization is terminated  or revoked sooner.       Influenza A by PCR 06/07/2022 NEGATIVE  NEGATIVE Final   Influenza B by PCR 06/07/2022 NEGATIVE  NEGATIVE Final   Comment: (NOTE) The Xpert Xpress SARS-CoV-2/FLU/RSV plus assay is intended as an aid in the diagnosis of influenza from Nasopharyngeal swab specimens and should not be used as a sole basis for treatment. Nasal washings and aspirates are unacceptable for Xpert Xpress SARS-CoV-2/FLU/RSV testing.  Fact Sheet for Patients: BloggerCourse.com  Fact Sheet for Healthcare Providers: SeriousBroker.it  This test is not yet approved or cleared by the [place] FDA and has been authorized for detection and/or diagnosis of SARS-CoV-2 by FDA under an Emergency Use Authorization (EUA). This EUA will remain in effect (meaning this test can be used) for the duration of the COVID-19 declaration under Section 564(b)(1) of the Act, 21 U.S.C. section 360bbb-3(b)(1), unless the authorization is terminated or revoked.  Performed at [facility], [address], [place], [place] [address]    WBC 06/07/2022 5.7  4.0 - 10.5 K/uL Final   RBC 06/07/2022 4.39  3.87 - 5.11 MIL/uL Final   Hemoglobin 06/07/2022 13.2  12.0 - 15.0 g/dL Final   HCT 19/14/7829 40.4  36.0 - 46.0 % Final   MCV 06/07/2022 92.0  80.0 - 100.0 fL Final   MCH 06/07/2022 30.1  26.0 - 34.0 pg Final   MCHC 06/07/2022 32.7  30.0 - 36.0 g/dL Final   RDW 56/21/3086 12.4  11.5 - 15.5 % Final   Platelets 06/07/2022 308  150 - 400 K/uL Final   nRBC 06/07/2022 0.0  0.0 - 0.2 % Final   Neutrophils Relative % 06/07/2022 42  % Final   Neutro Abs 06/07/2022 2.4  1.7 - 7.7 K/uL Final   Lymphocytes Relative 06/07/2022 54  % Final   Lymphs Abs 06/07/2022 3.1  0.7 - 4.0 K/uL Final   Monocytes Relative 06/07/2022 4  % Final   Monocytes Absolute 06/07/2022 0.2  0.1 - 1.0 K/uL Final   Eosinophils Relative 06/07/2022 0  % Final   Eosinophils Absolute 06/07/2022 0.0  0.0 - 0.5 K/uL Final   Basophils Relative 06/07/2022 0  % Final   Basophils Absolute 06/07/2022 0.0  0.0 - 0.1 K/uL Final   Immature Granulocytes 06/07/2022 0  % Final   Abs Immature Granulocytes 06/07/2022 0.01  0.00 - 0.07 K/uL Final   Performed at [facility], [address], [place], [place] [address]   Sodium 06/07/2022 139  135 - 145 mmol/L Final   Potassium 06/07/2022 4.0  3.5 - 5.1 mmol/L Final   Chloride 06/07/2022 104  98 - 111 mmol/L Final   CO2 06/07/2022 28  22 - 32 mmol/L Final   Glucose, Bld 06/07/2022 104 (H)  70 - 99 mg/dL Final   Glucose reference range applies only to samples taken after fasting for at least 8 hours.   BUN 06/07/2022 8  6 - 20 mg/dL Final   Creatinine, Ser 06/07/2022 0.84  0.44 - 1.00 mg/dL Final   Calcium 96/29/5284 9.1  8.9 - 10.3 mg/dL Final   Total  Protein 06/07/2022 6.9  6.5 - 8.1 g/dL Final   Albumin 16/07/9603 3.7  3.5 - 5.0 g/dL Final   AST 54/06/8118 19  15 - 41 U/L Final   ALT 06/07/2022 22  0 - 44 U/L Final   Alkaline Phosphatase 06/07/2022 54  38 - 126 U/L Final   Total  Bilirubin 06/07/2022 0.4  0.3 - 1.2 mg/dL Final   GFR, Estimated 06/07/2022 >60  >60 mL/min Final   Comment: (NOTE) Calculated using the CKD-EPI Creatinine Equation (2021)    Anion gap 06/07/2022 7  5 - 15 Final   Performed at [facility], [address], [place], [place] [address]   Hgb A1c MFr Bld 06/07/2022 5.0  4.8 - 5.6 % Final   Comment: (NOTE) Pre diabetes:          5.7%-6.4%  Diabetes:              >6.4%  Glycemic control for   <7.0% adults with diabetes    Mean Plasma Glucose 06/07/2022 96.8  mg/dL Final   Performed at [facility], [address], [place], [place] [address]   TSH 06/07/2022 1.620  0.350 - 4.500 uIU/mL Final   Comment: Performed by a 3rd Generation assay with a functional sensitivity of <=0.01 uIU/mL. Performed at [facility], [address], [place], [place] [address]    RPR Ser Ql 06/07/2022 NON REACTIVE  NON REACTIVE Final   Performed at [facility], [address], [place], [place] [address]   Color, Urine 06/07/2022 YELLOW  YELLOW Final   APPearance 06/07/2022 HAZY (A)  CLEAR Final   Specific Gravity, Urine 06/07/2022 1.018  1.005 - 1.030 Final   pH 06/07/2022 7.0  5.0 - 8.0 Final   Glucose, UA 06/07/2022 NEGATIVE  NEGATIVE mg/dL Final   Hgb urine dipstick 06/07/2022 NEGATIVE  NEGATIVE Final   Bilirubin Urine 06/07/2022 NEGATIVE  NEGATIVE Final   Ketones, ur 06/07/2022 NEGATIVE  NEGATIVE mg/dL Final   Protein, ur 62/95/2841 NEGATIVE  NEGATIVE mg/dL Final   Nitrite 32/44/0102 NEGATIVE  NEGATIVE Final   Leukocytes,Ua 06/07/2022 NEGATIVE  NEGATIVE Final   Performed at [facility], [address], [place], [place] [address]   Cholesterol 06/07/2022 164  0 - 200 mg/dL Final   Triglycerides 64/40/3474 158 (H)  <150 mg/dL Final   HDL 25/95/6387 44  >40 mg/dL Final   Total CHOL/HDL Ratio 06/07/2022 3.7  RATIO Final   VLDL 06/07/2022 32  0 - 40 mg/dL Final   LDL Cholesterol 06/07/2022 88  0 - 99 mg/dL Final   Comment:         Total Cholesterol/HDL:CHD Risk Coronary Heart Disease Risk Table                     Men   Women  1/2 Average Risk   3.4   3.3  Average Risk       5.0   4.4  2 X Average Risk   9.6   7.1  3 X Average Risk  23.4   11.0        Use the calculated Patient Ratio above and the CHD Risk Table to determine the patient's CHD Risk.        ATP III CLASSIFICATION (LDL):  <100     mg/dL   Optimal  564-332  mg/dL   Near or Above  Optimal  130-159  mg/dL   Borderline  045-409  mg/dL   High  >811     mg/dL   Very High Performed at [facility], [address] [place], [place] [address]    HIV Screen 4th Generation wRfx 06/07/2022 Non Reactive  Non Reactive Final   Performed at [facility], [address], [place], [place] [address]   SARSCOV2ONAVIRUS 2 AG 06/07/2022 NEGATIVE  NEGATIVE Final   Comment: (NOTE) SARS-CoV-2 antigen NOT DETECTED.   Negative results are presumptive.  Negative results do not preclude SARS-CoV-2 infection and should not be used as the sole basis for treatment or other patient management decisions, including infection  control decisions, particularly in the presence of clinical signs and  symptoms consistent with COVID-19, or in those who have been in contact with the virus.  Negative results must be combined with clinical observations, patient history, and epidemiological information. The expected result is Negative.  Fact Sheet for Patients: https://www.jennings-kim.com/  Fact Sheet for Healthcare Providers: https://alexander-rogers.biz/  This test is not yet approved or cleared by the [place] FDA and  has been authorized for detection and/or diagnosis of SARS-CoV-2 by FDA under an Emergency Use Authorization (EUA).  This EUA will remain in effect (meaning this test can be used) for the duration of  the COV                          ID-19 declaration under Section 564(b)(1) of the Act,  21 U.S.C. section 360bbb-3(b)(1), unless the authorization is terminated or revoked sooner.     POC Amphetamine UR 06/07/2022 None Detected  NONE DETECTED (Cut Off Level 1000 ng/mL) Final   POC Secobarbital (BAR) 06/07/2022 None Detected  NONE DETECTED (Cut Off Level 300 ng/mL) Final   POC Buprenorphine (BUP) 06/07/2022 None Detected  NONE DETECTED (Cut Off Level 10 ng/mL) Final   POC Oxazepam (BZO) 06/07/2022 None Detected  NONE DETECTED (Cut Off Level 300 ng/mL) Final   POC Cocaine UR 06/07/2022 None Detected  NONE DETECTED (Cut Off Level 300 ng/mL) Final   POC Methamphetamine UR 06/07/2022 None Detected  NONE DETECTED (Cut Off Level 1000 ng/mL) Final   POC Morphine 06/07/2022 None Detected  NONE DETECTED (Cut Off Level 300 ng/mL) Final   POC Methadone UR 06/07/2022 None Detected  NONE DETECTED (Cut Off Level 300 ng/mL) Final   POC Oxycodone UR 06/07/2022 None Detected  NONE DETECTED (Cut Off Level 100 ng/mL) Final   POC Marijuana UR 06/07/2022 None Detected  NONE DETECTED (Cut Off Level 50 ng/mL) Final   Preg Test, Ur 06/08/2022 NEGATIVE  NEGATIVE Final   Comment:        THE SENSITIVITY OF THIS METHODOLOGY IS >20 mIU/mL. Performed at [facility], [address], [place], [place] [address]    Preg Test, Ur 06/08/2022 NEGATIVE  NEGATIVE Final   Comment:        THE SENSITIVITY OF THIS METHODOLOGY IS >24 mIU/mL   Admission on 02/07/2022, Discharged on 02/08/2022  Component Date Value Ref Range Status   SARS Coronavirus 2 by RT PCR 02/07/2022 NEGATIVE  NEGATIVE Final   Comment: (NOTE) SARS-CoV-2 target nucleic acids are NOT DETECTED.  The SARS-CoV-2 RNA is generally detectable in upper respiratory specimens during the acute phase of infection. The lowest concentration of SARS-CoV-2 viral copies this assay can detect is 138 copies/mL. A negative result does not preclude SARS-Cov-2 infection and should not be  used as the sole basis for treatment or other patient management  decisions. A negative result may occur with  improper specimen collection/handling, submission of specimen other than nasopharyngeal swab, presence of viral mutation(s) within the areas targeted by this assay, and inadequate number of viral copies(<138 copies/mL). A negative result must be combined with clinical observations, patient history, and epidemiological information. The expected result is Negative.  Fact Sheet for Patients:  BloggerCourse.com  Fact Sheet for Healthcare Providers:  SeriousBroker.it  This test is no                          t yet approved or cleared by the [place] FDA and  has been authorized for detection and/or diagnosis of SARS-CoV-2 by FDA under an Emergency Use Authorization (EUA). This EUA will remain  in effect (meaning this test can be used) for the duration of the COVID-19 declaration under Section 564(b)(1) of the Act, 21 U.S.C.section 360bbb-3(b)(1), unless the authorization is terminated  or revoked sooner.       Influenza A by PCR 02/07/2022 NEGATIVE  NEGATIVE Final   Influenza B by PCR 02/07/2022 NEGATIVE  NEGATIVE Final   Comment: (NOTE) The Xpert Xpress SARS-CoV-2/FLU/RSV plus assay is intended as an aid in the diagnosis of influenza from Nasopharyngeal swab specimens and should not be used as a sole basis for treatment. Nasal washings and aspirates are unacceptable for Xpert Xpress SARS-CoV-2/FLU/RSV testing.  Fact Sheet for Patients: BloggerCourse.com  Fact Sheet for Healthcare Providers: SeriousBroker.it  This test is not yet approved or cleared by the [place] FDA and has been authorized for detection and/or diagnosis of SARS-CoV-2 by FDA under an Emergency Use Authorization (EUA). This EUA will remain in effect (meaning this test can be used) for the duration of the COVID-19 declaration under Section 564(b)(1) of the Act,  21 U.S.C. section 360bbb-3(b)(1), unless the authorization is terminated or revoked.  Performed at [facility], [address] [place], [place] [address]    WBC 02/07/2022 8.1  4.0 - 10.5 K/uL Final   RBC 02/07/2022 4.55  3.87 - 5.11 MIL/uL Final   Hemoglobin 02/07/2022 13.6  12.0 - 15.0 g/dL Final   HCT 60/45/4098 41.9  36.0 - 46.0 % Final   MCV 02/07/2022 92.1  80.0 - 100.0 fL Final   MCH 02/07/2022 29.9  26.0 - 34.0 pg Final   MCHC 02/07/2022 32.5  30.0 - 36.0 g/dL Final   RDW 11/91/4782 12.9  11.5 - 15.5 % Final   Platelets 02/07/2022 286  150 - 400 K/uL Final   nRBC 02/07/2022 0.0  0.0 - 0.2 % Final   Neutrophils Relative % 02/07/2022 41  % Final   Neutro Abs 02/07/2022 3.3  1.7 - 7.7 K/uL Final   Lymphocytes Relative 02/07/2022 53  % Final   Lymphs Abs 02/07/2022 4.3 (H)  0.7 - 4.0 K/uL Final   Monocytes Relative 02/07/2022 6  % Final   Monocytes Absolute 02/07/2022 0.5  0.1 - 1.0 K/uL Final   Eosinophils Relative 02/07/2022 0  % Final   Eosinophils Absolute 02/07/2022 0.0  0.0 - 0.5 K/uL Final   Basophils Relative 02/07/2022 0  % Final   Basophils Absolute 02/07/2022 0.0  0.0 - 0.1 K/uL Final   Immature Granulocytes 02/07/2022 0  % Final   Abs Immature Granulocytes 02/07/2022 0.02  0.00 - 0.07 K/uL Final   Performed at [facility], [address] [place],  [place] [address]   Sodium 02/07/2022 137  135 - 145 mmol/L Final   Potassium 02/07/2022 4.1  3.5 - 5.1 mmol/L Final   Chloride 02/07/2022 104  98 - 111 mmol/L Final   CO2 02/07/2022 29  22 - 32 mmol/L Final   Glucose, Bld 02/07/2022 92  70 - 99 mg/dL Final   Glucose reference range applies only to samples taken after fasting for at least 8 hours.   BUN 02/07/2022 10  6 - 20 mg/dL Final   Creatinine, Ser 02/07/2022 0.88  0.44 - 1.00 mg/dL Final   Calcium 60/45/4098 9.4  8.9 - 10.3 mg/dL Final   Total Protein 11/91/4782 7.2  6.5 - 8.1 g/dL Final   Albumin 95/62/1308 3.9  3.5 - 5.0 g/dL Final   AST  65/78/4696 18  15 - 41 U/L Final   ALT 02/07/2022 25  0 - 44 U/L Final   Alkaline Phosphatase 02/07/2022 66  38 - 126 U/L Final   Total Bilirubin 02/07/2022 0.2 (L)  0.3 - 1.2 mg/dL Final   GFR, Estimated 02/07/2022 >60  >60 mL/min Final   Comment: (NOTE) Calculated using the CKD-EPI Creatinine Equation (2021)    Anion gap 02/07/2022 4 (L)  5 - 15 Final   Performed at [facility], [address] [place], [place] [address]   Hgb A1c MFr Bld 02/07/2022 5.0  4.8 - 5.6 % Final   Comment: (NOTE) Pre diabetes:          5.7%-6.4%  Diabetes:              >6.4%  Glycemic control for   <7.0% adults with diabetes    Mean Plasma Glucose 02/07/2022 96.8  mg/dL Final   Performed at [facility], [address] [place], [place] [address]   Alcohol, Ethyl (B) 02/07/2022 <10  <10 mg/dL Final   Comment: (NOTE) Lowest detectable limit for serum alcohol is 10 mg/dL.  For medical purposes only. Performed at [facility], [address], [place], [place] [address]    Cholesterol 02/07/2022 192  0 - 200 mg/dL Final   Triglycerides 72/53/6644 192 (H)  <150 mg/dL Final   HDL 03/47/4259 46  >40 mg/dL Final   Total CHOL/HDL Ratio 02/07/2022 4.2  RATIO Final   VLDL 02/07/2022 38  0 - 40 mg/dL Final   LDL Cholesterol 02/07/2022 108 (H)  0 - 99 mg/dL Final   Comment:        Total Cholesterol/HDL:CHD Risk Coronary Heart Disease Risk Table                     Men   Women  1/2 Average Risk   3.4   3.3  Average Risk       5.0   4.4  2 X Average Risk   9.6   7.1  3 X Average Risk  23.4   11.0        Use the calculated Patient Ratio above and the CHD Risk Table to determine the patient's CHD Risk.        ATP III CLASSIFICATION (LDL):  <100     mg/dL   Optimal  563-875  mg/dL   Near or Above                    Optimal  130-159  mg/dL   Borderline  643-329  mg/dL   High  >518     mg/dL  Very High Performed at [facility], [address], [place], [place] [address]     TSH 02/07/2022 6.344 (H)  0.350 - 4.500 uIU/mL Final   Comment: Performed by a 3rd Generation assay with a functional sensitivity of <=0.01 uIU/mL. Performed at [facility], [address] [place], [place] [address]    POC Amphetamine UR 02/07/2022 None Detected  NONE DETECTED (Cut Off Level 1000 ng/mL) Final   POC Secobarbital (BAR) 02/07/2022 None Detected  NONE DETECTED (Cut Off Level 300 ng/mL) Final   POC Buprenorphine (BUP) 02/07/2022 None Detected  NONE DETECTED (Cut Off Level 10 ng/mL) Final   POC Oxazepam (BZO) 02/07/2022 None Detected  NONE DETECTED (Cut Off Level 300 ng/mL) Final   POC Cocaine UR 02/07/2022 None Detected  NONE DETECTED (Cut Off Level 300 ng/mL) Final   POC Methamphetamine UR 02/07/2022 None Detected  NONE DETECTED (Cut Off Level 1000 ng/mL) Final   POC Morphine 02/07/2022 None Detected  NONE DETECTED (Cut Off Level 300 ng/mL) Final   POC Oxycodone UR 02/07/2022 None Detected  NONE DETECTED (Cut Off Level 100 ng/mL) Final   POC Methadone UR 02/07/2022 None Detected  NONE DETECTED (Cut Off Level 300 ng/mL) Final   POC Marijuana UR 02/07/2022 None Detected  NONE DETECTED (Cut Off Level 50 ng/mL) Final   SARSCOV2ONAVIRUS 2 AG 02/07/2022 NEGATIVE  NEGATIVE Final   Comment: (NOTE) SARS-CoV-2 antigen NOT DETECTED.   Negative results are presumptive.  Negative results do not preclude SARS-CoV-2 infection and should not be used as the sole basis for treatment or other patient management decisions, including infection  control decisions, particularly in the presence of clinical signs and  symptoms consistent with COVID-19, or in those who have been in contact with the virus.  Negative results must be combined with clinical observations, patient history, and epidemiological information. The expected result is Negative.  Fact Sheet for Patients: https://www.jennings-kim.com/  Fact Sheet for Healthcare  Providers: https://alexander-rogers.biz/  This test is not yet approved or cleared by the [place] FDA and  has been authorized for detection and/or diagnosis of SARS-CoV-2 by FDA under an Emergency Use Authorization (EUA).  This EUA will remain in effect (meaning this test can be used) for the duration of  the COV                          ID-19 declaration under Section 564(b)(1) of the Act, 21 U.S.C. section 360bbb-3(b)(1), unless the authorization is terminated or revoked sooner.     Preg Test, Ur 02/07/2022 NEGATIVE  NEGATIVE Final   Comment:        THE SENSITIVITY OF THIS METHODOLOGY IS >24 mIU/mL     Blood Alcohol level:  Lab Results  Component Value Date   ETH <10 07/04/2022   ETH <10 02/07/2022    Metabolic Disorder Labs: Lab Results  Component Value Date   HGBA1C 5.0 06/07/2022   MPG 96.8 06/07/2022   MPG 96.8 02/07/2022   No results found for: "PROLACTIN" Lab Results  Component Value Date   CHOL 181 07/25/2022   TRIG 118 07/25/2022   HDL 45 07/25/2022   CHOLHDL 4.0 07/25/2022   VLDL 24 07/25/2022   LDLCALC 112 (H) 07/25/2022   LDLCALC 88 06/07/2022    Therapeutic Lab Levels: No results found for: "LITHIUM" No results found for: "VALPROATE" No results found for: "CBMZ"  Physical Findings   GAD-7    Flowsheet Row [facility]  [facility]  Total GAD-7 Score 17      PHQ2-9    Flowsheet Row [facility]  PHQ-2 Total Score 1 2  PHQ-9 Total Score 2 8      Flowsheet Row [facility]   C-SSRS RISK CATEGORY High Risk No Risk No Risk        Musculoskeletal  Strength & Muscle Tone: within  normal limits Gait & Station: normal Patient leans: N/A  Psychiatric Specialty Exam  Presentation  General Appearance:  Appropriate for Environment; Casual; Fairly Groomed  Eye Contact: Good  Speech: Clear and Coherent; Normal Rate  Speech Volume: Normal  Handedness: Right   Mood and Affect  Mood: Euthymic  Affect: Appropriate; Congruent; Full Range (brighter affect, smiling)   Thought Process  Thought Processes: Coherent; Goal Directed; Linear  Descriptions of Associations:Intact  Orientation:Full (Time, Place and Person)  Thought Content:Logical; WDL  Diagnosis of Schizophrenia or Schizoaffective disorder in past: Yes    Hallucinations:Hallucinations: None   Ideas of Reference:None  Suicidal Thoughts:Suicidal Thoughts: No (Contracted to safety)  Homicidal Thoughts:Homicidal Thoughts: No    Sensorium  Memory: Immediate Fair  Judgment: Fair  Insight: Fair   Executive Functions  Concentration: Good  Attention Span: Good  Recall: Good  Fund of Knowledge: Good  Language: Good   Psychomotor Activity  Psychomotor Activity: Psychomotor Activity: Normal (did note some initial rocking back and forth when observing from afar. rocking stopped during interview.)    Assets  Assets: Communication Skills; Desire for Improvement   Sleep  Sleep: Sleep: Good (feels restful, had nightmare)   Physical Exam  Physical Exam Vitals and nursing note reviewed.  Constitutional:      General: She is not in acute distress.    Appearance: She is not ill-appearing or diaphoretic.  HENT:     Head: Normocephalic and atraumatic.  Pulmonary:     Effort: Pulmonary effort is normal. No respiratory distress.  Neurological:     General: No focal deficit present.     Mental Status: She is alert.     Blood pressure 110/75, pulse 93, temperature 98.2 F (36.8 C), temperature source Oral, resp. rate 18, SpO2 100 %. There is no height or weight on  file to calculate BMI.  [facility]   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 Started Synthroid at 25 mcg Started Senokot twice daily for constipation  Dispo Tentative date: Unsure Barrier: Housing  Patient continues to be psychiatric stable and ready for dispo pending documentation from legal guardian and APS.  Signed: Princess BruinsJulie Keaira Whitehurst, DO Psychiatry Resident, PGY-2 08/01/2022, 10:00 AM  [facility] [address] [place] [id number] Dept: [phone] Dept Fax: [phone]

## 2022-08-01 NOTE — Progress Notes (Signed)
Patient reports that she may be interested in increasing her dose of trazodone.  She added that it was decreased four years ago after she had her daughter, so she could hear her if she cried.  She reports that at that time she was taking 300 mg nightly.

## 2022-08-01 NOTE — ED Notes (Signed)
Pt sleeping@this time. Breathing even and unlabored. Will continue to monitor for safety 

## 2022-08-01 NOTE — ED Notes (Signed)
Patient awake and ate breakfast. Patient denies any pain or discomfort and ate breakfast. Patient denies SI,HI, and A/V/H with no plan/intent.Patient asked for a pen and paper to write down her thoughts and is calmly watching TV. Patient verbalizes no further concerns at this time.

## 2022-08-01 NOTE — Progress Notes (Signed)
[facility] spoke with the client and she asked for updates regarding her case.  She reports her family and case worker are trying to locate another group home.   [facility] will leave messages with father and case worker for updates.  A prior note stated the client may go with her foster mother but the client's father/legal guardian has not provided updates regarding this matter.  [facility]  [place]

## 2022-08-02 MED ORDER — LEVOTHYROXINE SODIUM 100 MCG PO TABS
100.0000 ug | ORAL_TABLET | Freq: Every day | ORAL | Status: DC
  Administered 2022-08-03 – 2022-10-07 (×66): 100 ug via ORAL
  Filled 2022-08-02 (×67): qty 1

## 2022-08-02 MED ORDER — ARIPIPRAZOLE ER 400 MG IM SRER
400.0000 mg | INTRAMUSCULAR | Status: DC
  Administered 2022-08-02 – 2022-09-27 (×3): 400 mg via INTRAMUSCULAR

## 2022-08-02 NOTE — ED Notes (Signed)
Patient observed/assessed in bed/chair alert and oriented x4. Patient pleasant/cooperative. Speech low/soft with flat affect. Patient verbalized complaint of lower abdomen pain 4/10. Denied having BM since admission. PRN MOM offered. Patient denies A/V/H, Suicidal thoughts/plan and Harm towards others. Anxiety 4/10. Will continue to monitor and support.

## 2022-08-02 NOTE — Progress Notes (Signed)
[name] Progress Note  [name] sent an encrypted email to pt's legal guardian and parents containing a release of information form allowing two-way communication between [facility] and Mr. Marcy Salvo (father).  [name] instructed the legal guardian, Mrs. Rivka Safer, to sign the form.  [name] instructed them to either fax the form to [phone] or email it to Dr. Alfonse Spruce.  [name] also informed them of the pt's concerns with her [employer] benefits card and getting it back from the group home.   Omelia Blackwater, MSW, [place] phone

## 2022-08-02 NOTE — ED Notes (Signed)
Patient observed/assessed in bed/chair resting quietly appearing with no distress and verbalizing no complaints at this time. Will continue to monitor.  

## 2022-08-02 NOTE — ED Notes (Signed)
Pt sleeping soundly.  Breathing even and unlabored.  Staff will continue to monitor for safety.  

## 2022-08-02 NOTE — ED Notes (Signed)
Sleeping with no signs of sleep disturbance or distress respirations are easy,

## 2022-08-02 NOTE — ED Provider Notes (Cosign Needed Addendum)
Behavioral Health Progress Note  Date and Time: 08/02/2022 10:44 AM Name: Nicole [name] MRN:  [id number]   HPI: Nicole [name] is a 31 y.o. female, with PMH of SCZ unspecified, MDD with psychosis, who presented voluntary to [facility] (07/25/2022) via [facility] after a verbal altercation with [name] (not patient's legal guardian) for transient SI.  This is patient's fourth visit to [facility] for similar concerns this year. Patient has been dismissed from previous group home due to threatening SI and HI, then leaving the group home.   Legal Guardian: Nicole [name] (Maxine [place]) Point of contact: [name]) APS is following.   Subjective:  Patient was initially seen this AM awake, sitting up in bed, comfortable, no acute distress.  Patient stated that she has been receiving monthly LAI's, she believes that it was Risperdal.  Discussed with patient that will talk to pharmacy to confirm.  Patient is still mildly constipated, she did have a small bowel movement yesterday.  Patient denied having nightmares after removing nicotine patch before bed. Denied reflux sxs.  Otherwise she has no other questions or concerns.  [id number] Thoughts: No (Contracted to safety) SI Active Intent and/or Plan:  (Denied) [id number] Thoughts: No HI Active Intent and/or Plan:  (Denied) HI Passive Intent and/or Plan:  (Denied) [id number]: None Description of Auditory Hallucinations: Denied  Mood: Euthymic Sleep:Good (No nightmares) Appetite: Good  Review of Systems  Respiratory:  Negative for shortness of breath.   Cardiovascular:  Negative for chest pain.  Gastrointestinal:  Negative for nausea and vomiting.  Neurological:  Negative for dizziness and headaches.    Diagnosis:  Final diagnoses:  Severe episode of recurrent major depressive disorder, with psychotic features ([place])    Total Time spent with patient: 20 minutes  Past Psychiatric History: schizophrenia, schizoaffective disorder, MDD,  suicidal ideation Past Medical History:  Past Medical History:  Diagnosis Date   Anxiety    Depression     Past Surgical History:  Procedure Laterality Date   WISDOM TOOTH EXTRACTION Bilateral 2020   Family History:  Family History  Problem Relation Age of Onset   Hypertension Father    Diabetes Father    Family Psychiatric  History: none reported Social History:  Social History   Substance and Sexual Activity  Alcohol Use Never     Social History   Substance and Sexual Activity  Drug Use Never    Social History   Socioeconomic History   Marital status: Single    Spouse name: Not on file   Number of children: Not on file   Years of education: Not on file   Highest education level: Not on file  Occupational History   Not on file  Tobacco Use   Smoking status: Every Day    Types: Cigarettes   Smokeless tobacco: Never  Vaping Use   Vaping Use: Some days  Substance and Sexual Activity   Alcohol use: Never   Drug use: Never   Sexual activity: Yes    Partners: Female    Birth control/protection: Implant  Other Topics Concern   Not on file  Social History Narrative   Not on file   Social Determinants of Health   Financial Resource Strain: Not on file  Food Insecurity: Not on file  Transportation Needs: Not on file  Physical Activity: Not on file  Stress: Not on file  Social Connections: Not on file   SDOH:  SDOH Screenings   Depression (PHQ2-9): Low Risk  (06/10/2022)  Tobacco Use: High Risk (  07/04/2022)   Additional Social History:    Pain Medications: SEE MAR Prescriptions: SEE MAR Over the Counter: SEE MAR History of alcohol / drug use?: Yes Longest period of sobriety (when/how long): N/A Negative Consequences of Use:  (NONE) Withdrawal Symptoms: None Name of Substance 1: ETOH IN PAST--PT CURRENTLY DENIES 1 - Frequency: SOCIAL USE ETOH IN PAST 1 - Method of Aquiring: LEGAL 1- Route of Use: ORAL DRINK Name of Substance 2: [place] 2 - Amount (size/oz): LESS THAN ONE PACK 2 - Frequency: DAILY 2 - Method of Aquiring: LEGAL 2 - Route of Substance Use: ORAL SMOKE                Current Medications:  Current Facility-Administered Medications  Medication Dose Route Frequency Provider Last Rate Last Admin   acetaminophen (TYLENOL) tablet 650 mg  650 mg Oral Q6H PRN Onuoha, Chinwendu V, NP   650 mg at 07/31/22 1328   alum & mag hydroxide-simeth (MAALOX/MYLANTA) 200-200-20 MG/5ML suspension 30 mL  30 mL Oral Q4H PRN Onuoha, Chinwendu V, NP   30 mL at 07/27/22 2151   clotrimazole (LOTRIMIN) 1 % cream   Topical BID Bobbitt, Shalon E, NP   1 Application at [id number]   hydrOXYzine (ATARAX) tablet 25 mg  25 mg Oral TID PRN Onuoha, Chinwendu V, NP   25 mg at 08/01/22 2214   levothyroxine (SYNTHROID) tablet 25 mcg  25 mcg Oral Q0600 Merrily Brittle, DO   25 mcg at 08/02/22 0533   magnesium hydroxide (MILK OF MAGNESIA) suspension 30 mL  30 mL Oral Daily PRN Onuoha, Chinwendu V, NP       nicotine (NICODERM CQ - dosed in mg/24 hours) patch 14 mg  14 mg Transdermal Q0600 Onuoha, Chinwendu V, NP   14 mg at 08/02/22 0544   ondansetron (ZOFRAN) tablet 4 mg  4 mg Oral Once Nwoko, Uchenna E, PA       Oxcarbazepine (TRILEPTAL) tablet 300 mg  300 mg Oral BID Onuoha, Chinwendu V, NP   300 mg at 08/02/22 0915   pantoprazole (PROTONIX) EC tablet 20 mg  20 mg Oral Daily Merrily Brittle, DO   20 mg at 08/02/22 0915   QUEtiapine (SEROQUEL) tablet 400 mg  400 mg Oral BID Onuoha, Chinwendu V, NP   400 mg at 08/02/22 0915   senna-docusate (Senokot-S) tablet 1 tablet  1 tablet Oral BID Merrily Brittle, DO   1 tablet at 08/02/22 0954   sertraline (ZOLOFT) tablet 150 mg  150 mg Oral Daily Onuoha, Chinwendu V, NP   150 mg at 08/02/22 0915   traZODone (DESYREL) tablet 50 mg  50 mg Oral QHS PRN Onuoha, Chinwendu V, NP   50 mg at 08/01/22 2213   valACYclovir (VALTREX) tablet 500 mg  500 mg Oral Daily Merrily Brittle, DO   500 mg at 08/02/22 0915    Current Outpatient Medications  Medication Sig Dispense Refill   ABILIFY MAINTENA 400 MG PRSY prefilled syringe 400 mg every 28 (twenty-eight) days.     cetirizine (ZYRTEC) 10 MG tablet Take 10 mg by mouth daily.     cyclobenzaprine (FLEXERIL) 10 MG tablet Take 1 tablet (10 mg total) by mouth 2 (two) times daily as needed for muscle spasms. 20 tablet 0   fluticasone (FLONASE) 50 MCG/ACT nasal spray Place 1 spray into both nostrils daily.     hydrOXYzine (ATARAX) 50 MG tablet Take 1 tablet (50 mg total) by mouth 2 (two) times daily. 60 tablet  0   meloxicam (MOBIC) 15 MG tablet Take 15 mg by mouth daily.     Oxcarbazepine (TRILEPTAL) 300 MG tablet Take 1 tablet (300 mg total) by mouth 2 (two) times daily. 60 tablet 0   QUEtiapine (SEROQUEL) 400 MG tablet Take 1 tablet (400 mg total) by mouth 2 (two) times daily. 60 tablet 0   sertraline (ZOLOFT) 50 MG tablet Take 3 tablets (150 mg total) by mouth in the morning. 90 tablet 0   traZODone (DESYREL) 100 MG tablet Take 1 tablet (100 mg total) by mouth at bedtime. 30 tablet 0   valACYclovir (VALTREX) 500 MG tablet Take 500 mg by mouth daily.      Labs  Lab Results:  Admission on 07/25/2022  Component Date Value Ref Range Status   SARS Coronavirus 2 by RT PCR 07/25/2022 NEGATIVE  NEGATIVE Final   Comment: (NOTE) SARS-CoV-2 target nucleic acids are NOT DETECTED.  The SARS-CoV-2 RNA is generally detectable in upper respiratory specimens during the acute phase of infection. The lowest concentration of SARS-CoV-2 viral copies this assay can detect is 138 copies/mL. A negative result does not preclude SARS-Cov-2 infection and should not be used as the sole basis for treatment or other patient management decisions. A negative result may occur with  improper specimen collection/handling, submission of specimen other than nasopharyngeal swab, presence of viral mutation(s) within the areas targeted by this assay, and inadequate number of  viral copies(<138 copies/mL). A negative result must be combined with clinical observations, patient history, and epidemiological information. The expected result is Negative.  Fact Sheet for Patients:  EntrepreneurPulse.com.au  Fact Sheet for Healthcare Providers:  IncredibleEmployment.be  This test is no                          t yet approved or cleared by the [place] FDA and  has been authorized for detection and/or diagnosis of SARS-CoV-2 by FDA under an Emergency Use Authorization (EUA). This EUA will remain  in effect (meaning this test can be used) for the duration of the COVID-19 declaration under Section 564(b)(1) of the Act, 21 U.S.C.section 360bbb-3(b)(1), unless the authorization is terminated  or revoked sooner.       Influenza A by PCR 07/25/2022 NEGATIVE  NEGATIVE Final   Influenza B by PCR 07/25/2022 NEGATIVE  NEGATIVE Final   Comment: (NOTE) The Xpert Xpress SARS-CoV-2/FLU/RSV plus assay is intended as an aid in the diagnosis of influenza from Nasopharyngeal swab specimens and should not be used as a sole basis for treatment. Nasal washings and aspirates are unacceptable for Xpert Xpress SARS-CoV-2/FLU/RSV testing.  Fact Sheet for Patients: EntrepreneurPulse.com.au  Fact Sheet for Healthcare Providers: IncredibleEmployment.be  This test is not yet approved or cleared by the [place] FDA and has been authorized for detection and/or diagnosis of SARS-CoV-2 by FDA under an Emergency Use Authorization (EUA). This EUA will remain in effect (meaning this test can be used) for the duration of the COVID-19 declaration under Section 564(b)(1) of the Act, 21 U.S.C. section 360bbb-3(b)(1), unless the authorization is terminated or revoked.  Performed at [facility], [place] [address] [place], [place] [address]    WBC 07/25/2022 8.3  4.0 - 10.5 K/uL Final   RBC 07/25/2022 4.44   3.87 - 5.11 MIL/uL Final   Hemoglobin 07/25/2022 13.7  12.0 - 15.0 g/dL Final   HCT 07/25/2022 40.2  36.0 - 46.0 % Final   MCV 07/25/2022 90.5  80.0 -  100.0 fL Final   MCH 07/25/2022 30.9  26.0 - 34.0 pg Final   MCHC 07/25/2022 34.1  30.0 - 36.0 g/dL Final   RDW 07/25/2022 12.2  11.5 - 15.5 % Final   Platelets 07/25/2022 248  150 - 400 K/uL Final   nRBC 07/25/2022 0.0  0.0 - 0.2 % Final   Neutrophils Relative % 07/25/2022 43  % Final   Neutro Abs 07/25/2022 3.6  1.7 - 7.7 K/uL Final   Lymphocytes Relative 07/25/2022 52  % Final   Lymphs Abs 07/25/2022 4.2 (H)  0.7 - 4.0 K/uL Final   Monocytes Relative 07/25/2022 5  % Final   Monocytes Absolute 07/25/2022 0.4  0.1 - 1.0 K/uL Final   Eosinophils Relative 07/25/2022 0  % Final   Eosinophils Absolute 07/25/2022 0.0  0.0 - 0.5 K/uL Final   Basophils Relative 07/25/2022 0  % Final   Basophils Absolute 07/25/2022 0.0  0.0 - 0.1 K/uL Final   Immature Granulocytes 07/25/2022 0  % Final   Abs Immature Granulocytes 07/25/2022 0.02  0.00 - 0.07 K/uL Final   Performed at [facility], [place] [address], [place], [place] [address]   Sodium 07/25/2022 138  135 - 145 mmol/L Final   Potassium 07/25/2022 4.0  3.5 - 5.1 mmol/L Final   Chloride 07/25/2022 104  98 - 111 mmol/L Final   CO2 07/25/2022 29  22 - 32 mmol/L Final   Glucose, Bld 07/25/2022 83  70 - 99 mg/dL Final   Glucose reference range applies only to samples taken after fasting for at least 8 hours.   BUN 07/25/2022 11  6 - 20 mg/dL Final   Creatinine, Ser 07/25/2022 0.97  0.44 - 1.00 mg/dL Final   Calcium 07/25/2022 9.2  8.9 - 10.3 mg/dL Final   Total Protein 07/25/2022 7.0  6.5 - 8.1 g/dL Final   Albumin 07/25/2022 3.8  3.5 - 5.0 g/dL Final   AST 07/25/2022 18  15 - 41 U/L Final   ALT 07/25/2022 22  0 - 44 U/L Final   Alkaline Phosphatase 07/25/2022 64  38 - 126 U/L Final   Total Bilirubin 07/25/2022 0.2 (L)  0.3 - 1.2 mg/dL Final   GFR, Estimated 07/25/2022 >60  >60 mL/min Final    Comment: (NOTE) Calculated using the CKD-EPI Creatinine Equation (2021)    Anion gap 07/25/2022 5  5 - 15 Final   Performed at [place] [address] [place], [place] [address]   POC Amphetamine UR 07/25/2022 None Detected  NONE DETECTED (Cut Off Level 1000 ng/mL) Preliminary   POC Secobarbital (BAR) 07/25/2022 None Detected  NONE DETECTED (Cut Off Level 300 ng/mL) Preliminary   POC Buprenorphine (BUP) 07/25/2022 None Detected  NONE DETECTED (Cut Off Level 10 ng/mL) Preliminary   POC Oxazepam (BZO) 07/25/2022 None Detected  NONE DETECTED (Cut Off Level 300 ng/mL) Preliminary   POC Cocaine UR 07/25/2022 None Detected  NONE DETECTED (Cut Off Level 300 ng/mL) Preliminary   POC Methamphetamine UR 07/25/2022 None Detected  NONE DETECTED (Cut Off Level 1000 ng/mL) Preliminary   POC Morphine 07/25/2022 None Detected  NONE DETECTED (Cut Off Level 300 ng/mL) Preliminary   POC Methadone UR 07/25/2022 None Detected  NONE DETECTED (Cut Off Level 300 ng/mL) Preliminary   POC Oxycodone UR 07/25/2022 None Detected  NONE DETECTED (Cut Off Level 100 ng/mL) Preliminary   POC Marijuana UR 07/25/2022 None Detected  NONE DETECTED (Cut Off Level 50 ng/mL) Preliminary   SARSCOV2ONAVIRUS  2 AG 07/25/2022 NEGATIVE  NEGATIVE Final   Comment: (NOTE) SARS-CoV-2 antigen NOT DETECTED.   Negative results are presumptive.  Negative results do not preclude SARS-CoV-2 infection and should not be used as the sole basis for treatment or other patient management decisions, including infection  control decisions, particularly in the presence of clinical signs and  symptoms consistent with COVID-19, or in those who have been in contact with the virus.  Negative results must be combined with clinical observations, patient history, and epidemiological information. The expected result is Negative.  Fact Sheet for Patients: HandmadeRecipes.com.cy  Fact Sheet for Healthcare  Providers: FuneralLife.at  This test is not yet approved or cleared by the [place] FDA and  has been authorized for detection and/or diagnosis of SARS-CoV-2 by FDA under an Emergency Use Authorization (EUA).  This EUA will remain in effect (meaning this test can be used) for the duration of  the COV                          ID-19 declaration under Section 564(b)(1) of the Act, 21 U.S.C. section 360bbb-3(b)(1), unless the authorization is terminated or revoked sooner.     Cholesterol 07/25/2022 181  0 - 200 mg/dL Final   Triglycerides 07/25/2022 118  <150 mg/dL Final   HDL 07/25/2022 45  >40 mg/dL Final   Total CHOL/HDL Ratio 07/25/2022 4.0  RATIO Final   VLDL 07/25/2022 24  0 - 40 mg/dL Final   LDL Cholesterol 07/25/2022 112 (H)  0 - 99 mg/dL Final   Comment:        Total Cholesterol/HDL:CHD Risk Coronary Heart Disease Risk Table                     Men   Women  1/2 Average Risk   3.4   3.3  Average Risk       5.0   4.4  2 X Average Risk   9.6   7.1  3 X Average Risk  23.4   11.0        Use the calculated Patient Ratio above and the CHD Risk Table to determine the patient's CHD Risk.        ATP III CLASSIFICATION (LDL):  <100     mg/dL   Optimal  100-129  mg/dL   Near or Above                    Optimal  130-159  mg/dL   Borderline  160-189  mg/dL   High  >190     mg/dL   Very High Performed at [place] [address] [place], [place] [address]    TSH 07/25/2022 6.668 (H)  0.350 - 4.500 uIU/mL Final   Comment: Performed by a 3rd Generation assay with a functional sensitivity of <=0.01 uIU/mL. Performed at [facility], [place] [address] [place], [place] [address]    Glucose-Capillary 07/26/2022 104 (H)  70 - 99 mg/dL Final   Glucose reference range applies only to samples taken after fasting for at least 8 hours.   T3, Free 07/31/2022 2.3  2.0 - 4.4 pg/mL Final   Comment: (NOTE) Performed At: [facility] [place], [place] [id number] Rush Farmer MD [id number]    Free T4 07/31/2022 0.60 (L)  0.61 - 1.12 ng/dL Final   Comment: (NOTE) Biotin ingestion may interfere with free T4 tests. If the  results are inconsistent with the TSH level, previous test results, or the clinical presentation, then consider biotin interference. If needed, order repeat testing after stopping biotin. Performed at [facility], [place] [address], [place], [place] [address]   Admission on 07/04/2022, Discharged on 07/05/2022  Component Date Value Ref Range Status   Sodium 07/04/2022 142  135 - 145 mmol/L Final   Potassium 07/04/2022 4.4  3.5 - 5.1 mmol/L Final   Chloride 07/04/2022 109  98 - 111 mmol/L Final   CO2 07/04/2022 26  22 - 32 mmol/L Final   Glucose, Bld 07/04/2022 96  70 - 99 mg/dL Final   Glucose reference range applies only to samples taken after fasting for at least 8 hours.   BUN 07/04/2022 15  6 - 20 mg/dL Final   Creatinine, Ser 07/04/2022 0.83  0.44 - 1.00 mg/dL Final   Calcium 07/04/2022 9.3  8.9 - 10.3 mg/dL Final   Total Protein 07/04/2022 7.2  6.5 - 8.1 g/dL Final   Albumin 07/04/2022 3.9  3.5 - 5.0 g/dL Final   AST 07/04/2022 23  15 - 41 U/L Final   ALT 07/04/2022 28  0 - 44 U/L Final   Alkaline Phosphatase 07/04/2022 77  38 - 126 U/L Final   Total Bilirubin 07/04/2022 0.4  0.3 - 1.2 mg/dL Final   GFR, Estimated 07/04/2022 >60  >60 mL/min Final   Comment: (NOTE) Calculated using the CKD-EPI Creatinine Equation (2021)    Anion gap 07/04/2022 7  5 - 15 Final   Performed at [facility], [place] [address] [place], [place] [address]   Alcohol, Ethyl (B) 07/04/2022 <10  <10 mg/dL Final   Comment: (NOTE) Lowest detectable limit for serum alcohol is 10 mg/dL.  For medical purposes only. Performed at [facility], [place] [address] [place], [place] [address]    WBC 07/04/2022 7.0  4.0 - 10.5 K/uL Final   RBC 07/04/2022 4.18  3.87 - 5.11  MIL/uL Final   Hemoglobin 07/04/2022 12.8  12.0 - 15.0 g/dL Final   HCT 07/04/2022 39.1  36.0 - 46.0 % Final   MCV 07/04/2022 93.5  80.0 - 100.0 fL Final   MCH 07/04/2022 30.6  26.0 - 34.0 pg Final   MCHC 07/04/2022 32.7  30.0 - 36.0 g/dL Final   RDW 07/04/2022 12.9  11.5 - 15.5 % Final   Platelets 07/04/2022 243  150 - 400 K/uL Final   nRBC 07/04/2022 0.0  0.0 - 0.2 % Final   Neutrophils Relative % 07/04/2022 43  % Final   Neutro Abs 07/04/2022 3.0  1.7 - 7.7 K/uL Final   Lymphocytes Relative 07/04/2022 50  % Final   Lymphs Abs 07/04/2022 3.5  0.7 - 4.0 K/uL Final   Monocytes Relative 07/04/2022 7  % Final   Monocytes Absolute 07/04/2022 0.5  0.1 - 1.0 K/uL Final   Eosinophils Relative 07/04/2022 0  % Final   Eosinophils Absolute 07/04/2022 0.0  0.0 - 0.5 K/uL Final   Basophils Relative 07/04/2022 0  % Final   Basophils Absolute 07/04/2022 0.0  0.0 - 0.1 K/uL Final   Immature Granulocytes 07/04/2022 0  % Final   Abs Immature Granulocytes 07/04/2022 0.01  0.00 - 0.07 K/uL Final   Performed at [facility], [place] [address] [place], [place] [address]   I-stat hCG, quantitative 07/04/2022 <5.0  <5 mIU/mL Final   Comment 3 07/04/2022  Final   Comment:   GEST. AGE      CONC.  (mIU/mL)   <=1 WEEK        5 - 50     2 WEEKS       50 - 500     3 WEEKS       100 - 10,000     4 WEEKS     1,000 - 30,000        FEMALE AND NON-PREGNANT FEMALE:     LESS THAN 5 mIU/mL   Admission on 06/14/2022, Discharged on 06/14/2022  Component Date Value Ref Range Status   Color, UA 06/14/2022 yellow  yellow Final   Clarity, UA 06/14/2022 cloudy (A)  clear Final   Glucose, UA 06/14/2022 negative  negative mg/dL Final   Bilirubin, UA 06/14/2022 negative  negative Final   Ketones, POC UA 06/14/2022 negative  negative mg/dL Final   Spec Grav, UA 06/14/2022 1.025  1.010 - 1.025 Final   Blood, UA 06/14/2022 negative  negative Final   pH, UA 06/14/2022 7.5  5.0 - 8.0 Final   Protein  Ur, POC 06/14/2022 =30 (A)  negative mg/dL Final   Urobilinogen, UA 06/14/2022 0.2  0.2 or 1.0 E.U./dL Final   Nitrite, UA 06/14/2022 Negative  Negative Final   Leukocytes, UA 06/14/2022 Negative  Negative Final   Preg Test, Ur 06/14/2022 Negative  Negative Final   Specimen Description 06/14/2022 URINE, CLEAN CATCH   Final   Special Requests 06/14/2022    Final                   Value:NONE Performed at [facility], [place] [address], [place], [place] [address]    Culture 06/14/2022 MULTIPLE SPECIES PRESENT, SUGGEST RECOLLECTION (A)   Final   Report Status 06/14/2022 06/16/2022 FINAL   Final  Admission on 06/07/2022, Discharged on 06/10/2022  Component Date Value Ref Range Status   SARS Coronavirus 2 by RT PCR 06/07/2022 NEGATIVE  NEGATIVE Final   Comment: (NOTE) SARS-CoV-2 target nucleic acids are NOT DETECTED.  The SARS-CoV-2 RNA is generally detectable in upper respiratory specimens during the acute phase of infection. The lowest concentration of SARS-CoV-2 viral copies this assay can detect is 138 copies/mL. A negative result does not preclude SARS-Cov-2 infection and should not be used as the sole basis for treatment or other patient management decisions. A negative result may occur with  improper specimen collection/handling, submission of specimen other than nasopharyngeal swab, presence of viral mutation(s) within the areas targeted by this assay, and inadequate number of viral copies(<138 copies/mL). A negative result must be combined with clinical observations, patient history, and epidemiological information. The expected result is Negative.  Fact Sheet for Patients:  EntrepreneurPulse.com.au  Fact Sheet for Healthcare Providers:  IncredibleEmployment.be  This test is no                          t yet approved or cleared by the [place] FDA and  has been authorized for detection and/or diagnosis of SARS-CoV-2 by FDA under  an Emergency Use Authorization (EUA). This EUA will remain  in effect (meaning this test can be used) for the duration of the COVID-19 declaration under Section 564(b)(1) of the Act, 21 U.S.C.section 360bbb-3(b)(1), unless the authorization is terminated  or revoked sooner.       Influenza A by PCR 06/07/2022 NEGATIVE  NEGATIVE Final   Influenza B by PCR 06/07/2022 NEGATIVE  NEGATIVE Final   Comment: (NOTE) The Xpert Xpress SARS-CoV-2/FLU/RSV plus assay is intended as an aid in the diagnosis of influenza from Nasopharyngeal swab specimens and should not be used as a sole basis for treatment. Nasal washings and aspirates are unacceptable for Xpert Xpress SARS-CoV-2/FLU/RSV testing.  Fact Sheet for Patients: EntrepreneurPulse.com.au  Fact Sheet for Healthcare Providers: IncredibleEmployment.be  This test is not yet approved or cleared by the [place] FDA and has been authorized for detection and/or diagnosis of SARS-CoV-2 by FDA under an Emergency Use Authorization (EUA). This EUA will remain in effect (meaning this test can be used) for the duration of the COVID-19 declaration under Section 564(b)(1) of the Act, 21 U.S.C. section 360bbb-3(b)(1), unless the authorization is terminated or revoked.  Performed at [facility], [place] [address], [place], [place] [address]    WBC 06/07/2022 5.7  4.0 - 10.5 K/uL Final   RBC 06/07/2022 4.39  3.87 - 5.11 MIL/uL Final   Hemoglobin 06/07/2022 13.2  12.0 - 15.0 g/dL Final   HCT 06/07/2022 40.4  36.0 - 46.0 % Final   MCV 06/07/2022 92.0  80.0 - 100.0 fL Final   MCH 06/07/2022 30.1  26.0 - 34.0 pg Final   MCHC 06/07/2022 32.7  30.0 - 36.0 g/dL Final   RDW 06/07/2022 12.4  11.5 - 15.5 % Final   Platelets 06/07/2022 308  150 - 400 K/uL Final   nRBC 06/07/2022 0.0  0.0 - 0.2 % Final   Neutrophils Relative % 06/07/2022 42  % Final   Neutro Abs 06/07/2022 2.4  1.7 - 7.7 K/uL Final   Lymphocytes  Relative 06/07/2022 54  % Final   Lymphs Abs 06/07/2022 3.1  0.7 - 4.0 K/uL Final   Monocytes Relative 06/07/2022 4  % Final   Monocytes Absolute 06/07/2022 0.2  0.1 - 1.0 K/uL Final   Eosinophils Relative 06/07/2022 0  % Final   Eosinophils Absolute 06/07/2022 0.0  0.0 - 0.5 K/uL Final   Basophils Relative 06/07/2022 0  % Final   Basophils Absolute 06/07/2022 0.0  0.0 - 0.1 K/uL Final   Immature Granulocytes 06/07/2022 0  % Final   Abs Immature Granulocytes 06/07/2022 0.01  0.00 - 0.07 K/uL Final   Performed at [facility], [place] [address] [place], [place] [address]   Sodium 06/07/2022 139  135 - 145 mmol/L Final   Potassium 06/07/2022 4.0  3.5 - 5.1 mmol/L Final   Chloride 06/07/2022 104  98 - 111 mmol/L Final   CO2 06/07/2022 28  22 - 32 mmol/L Final   Glucose, Bld 06/07/2022 104 (H)  70 - 99 mg/dL Final   Glucose reference range applies only to samples taken after fasting for at least 8 hours.   BUN 06/07/2022 8  6 - 20 mg/dL Final   Creatinine, Ser 06/07/2022 0.84  0.44 - 1.00 mg/dL Final   Calcium 06/07/2022 9.1  8.9 - 10.3 mg/dL Final   Total Protein 06/07/2022 6.9  6.5 - 8.1 g/dL Final   Albumin 06/07/2022 3.7  3.5 - 5.0 g/dL Final   AST 06/07/2022 19  15 - 41 U/L Final   ALT 06/07/2022 22  0 - 44 U/L Final   Alkaline Phosphatase 06/07/2022 54  38 - 126 U/L Final   Total Bilirubin 06/07/2022 0.4  0.3 - 1.2 mg/dL Final   GFR, Estimated 06/07/2022 >60  >60 mL/min Final   Comment: (NOTE) Calculated using the CKD-EPI Creatinine Equation (2021)    Anion  gap 06/07/2022 7  5 - 15 Final   Performed at [facility], [place] [address] [place], [place] [address]   Hgb A1c MFr Bld 06/07/2022 5.0  4.8 - 5.6 % Final   Comment: (NOTE) Pre diabetes:          5.7%-6.4%  Diabetes:              >6.4%  Glycemic control for   <7.0% adults with diabetes    Mean Plasma Glucose 06/07/2022 96.8  mg/dL Final   Performed at [facility], [place] [address], [place], [place]  [address]   TSH 06/07/2022 1.620  0.350 - 4.500 uIU/mL Final   Comment: Performed by a 3rd Generation assay with a functional sensitivity of <=0.01 uIU/mL. Performed at [facility], [place] [address], [place], [place] [address]    RPR Ser Ql 06/07/2022 NON REACTIVE  NON REACTIVE Final   Performed at [facility], [place] [address], [place], [place] [address]   Color, Urine 06/07/2022 YELLOW  YELLOW Final   APPearance 06/07/2022 HAZY (A)  CLEAR Final   Specific Gravity, Urine 06/07/2022 1.018  1.005 - 1.030 Final   pH 06/07/2022 7.0  5.0 - 8.0 Final   Glucose, UA 06/07/2022 NEGATIVE  NEGATIVE mg/dL Final   Hgb urine dipstick 06/07/2022 NEGATIVE  NEGATIVE Final   Bilirubin Urine 06/07/2022 NEGATIVE  NEGATIVE Final   Ketones, ur 06/07/2022 NEGATIVE  NEGATIVE mg/dL Final   Protein, ur 06/07/2022 NEGATIVE  NEGATIVE mg/dL Final   Nitrite 06/07/2022 NEGATIVE  NEGATIVE Final   Leukocytes,Ua 06/07/2022 NEGATIVE  NEGATIVE Final   Performed at [facility], [place] [address], [place], [place] [address]   Cholesterol 06/07/2022 164  0 - 200 mg/dL Final   Triglycerides 06/07/2022 158 (H)  <150 mg/dL Final   HDL 06/07/2022 44  >40 mg/dL Final   Total CHOL/HDL Ratio 06/07/2022 3.7  RATIO Final   VLDL 06/07/2022 32  0 - 40 mg/dL Final   LDL Cholesterol 06/07/2022 88  0 - 99 mg/dL Final   Comment:        Total Cholesterol/HDL:CHD Risk Coronary Heart Disease Risk Table                     Men   Women  1/2 Average Risk   3.4   3.3  Average Risk       5.0   4.4  2 X Average Risk   9.6   7.1  3 X Average Risk  23.4   11.0        Use the calculated Patient Ratio above and the CHD Risk Table to determine the patient's CHD Risk.        ATP III CLASSIFICATION (LDL):  <100     mg/dL   Optimal  100-129  mg/dL   Near or Above                    Optimal  130-159  mg/dL   Borderline  160-189  mg/dL   High  >190     mg/dL   Very High Performed at [facility] [address]  [place], [place] [address]    HIV Screen 4th Generation wRfx 06/07/2022 Non Reactive  Non Reactive Final   Performed at [facility], [place] [address], [place], [place] [address]   SARSCOV2ONAVIRUS 2 AG 06/07/2022 NEGATIVE  NEGATIVE Final   Comment: (NOTE)  SARS-CoV-2 antigen NOT DETECTED.   Negative results are presumptive.  Negative results do not preclude SARS-CoV-2 infection and should not be used as the sole basis for treatment or other patient management decisions, including infection  control decisions, particularly in the presence of clinical signs and  symptoms consistent with COVID-19, or in those who have been in contact with the virus.  Negative results must be combined with clinical observations, patient history, and epidemiological information. The expected result is Negative.  Fact Sheet for Patients: HandmadeRecipes.com.cy  Fact Sheet for Healthcare Providers: FuneralLife.at  This test is not yet approved or cleared by the [place] FDA and  has been authorized for detection and/or diagnosis of SARS-CoV-2 by FDA under an Emergency Use Authorization (EUA).  This EUA will remain in effect (meaning this test can be used) for the duration of  the COV                          ID-19 declaration under Section 564(b)(1) of the Act, 21 U.S.C. section 360bbb-3(b)(1), unless the authorization is terminated or revoked sooner.     POC Amphetamine UR 06/07/2022 None Detected  NONE DETECTED (Cut Off Level 1000 ng/mL) Final   POC Secobarbital (BAR) 06/07/2022 None Detected  NONE DETECTED (Cut Off Level 300 ng/mL) Final   POC Buprenorphine (BUP) 06/07/2022 None Detected  NONE DETECTED (Cut Off Level 10 ng/mL) Final   POC Oxazepam (BZO) 06/07/2022 None Detected  NONE DETECTED (Cut Off Level 300 ng/mL) Final   POC Cocaine UR 06/07/2022 None Detected  NONE DETECTED (Cut Off Level 300 ng/mL) Final   POC Methamphetamine UR 06/07/2022 None  Detected  NONE DETECTED (Cut Off Level 1000 ng/mL) Final   POC Morphine 06/07/2022 None Detected  NONE DETECTED (Cut Off Level 300 ng/mL) Final   POC Methadone UR 06/07/2022 None Detected  NONE DETECTED (Cut Off Level 300 ng/mL) Final   POC Oxycodone UR 06/07/2022 None Detected  NONE DETECTED (Cut Off Level 100 ng/mL) Final   POC Marijuana UR 06/07/2022 None Detected  NONE DETECTED (Cut Off Level 50 ng/mL) Final   Preg Test, Ur 06/08/2022 NEGATIVE  NEGATIVE Final   Comment:        THE SENSITIVITY OF THIS METHODOLOGY IS >20 mIU/mL. Performed at [facility], [place] [address], [place], [place] [address]    Preg Test, Ur 06/08/2022 NEGATIVE  NEGATIVE Final   Comment:        THE SENSITIVITY OF THIS METHODOLOGY IS >24 mIU/mL   Admission on 02/07/2022, Discharged on 02/08/2022  Component Date Value Ref Range Status   SARS Coronavirus 2 by RT PCR 02/07/2022 NEGATIVE  NEGATIVE Final   Comment: (NOTE) SARS-CoV-2 target nucleic acids are NOT DETECTED.  The SARS-CoV-2 RNA is generally detectable in upper respiratory specimens during the acute phase of infection. The lowest concentration of SARS-CoV-2 viral copies this assay can detect is 138 copies/mL. A negative result does not preclude SARS-Cov-2 infection and should not be used as the sole basis for treatment or other patient management decisions. A negative result may occur with  improper specimen collection/handling, submission of specimen other than nasopharyngeal swab, presence of viral mutation(s) within the areas targeted by this assay, and inadequate number of viral copies(<138 copies/mL). A negative result must be combined with clinical observations, patient history, and epidemiological information. The expected result is Negative.  Fact Sheet for Patients:  EntrepreneurPulse.com.au  Fact Sheet for Healthcare Providers:  IncredibleEmployment.be  This  test is no                           t yet approved or cleared by the [place] and  has been authorized for detection and/or diagnosis of SARS-CoV-2 by FDA under an Emergency Use Authorization (EUA). This EUA will remain  in effect (meaning this test can be used) for the duration of the COVID-19 declaration under Section 564(b)(1) of the Act, 21 U.S.C.section 360bbb-3(b)(1), unless the authorization is terminated  or revoked sooner.       Influenza A by PCR 02/07/2022 NEGATIVE  NEGATIVE Final   Influenza B by PCR 02/07/2022 NEGATIVE  NEGATIVE Final   Comment: (NOTE) The Xpert Xpress SARS-CoV-2/FLU/RSV plus assay is intended as an aid in the diagnosis of influenza from Nasopharyngeal swab specimens and should not be used as a sole basis for treatment. Nasal washings and aspirates are unacceptable for Xpert Xpress SARS-CoV-2/FLU/RSV testing.  Fact Sheet for Patients: EntrepreneurPulse.com.au  Fact Sheet for Healthcare Providers: IncredibleEmployment.be  This test is not yet approved or cleared by the [place] FDA and has been authorized for detection and/or diagnosis of SARS-CoV-2 by FDA under an Emergency Use Authorization (EUA). This EUA will remain in effect (meaning this test can be used) for the duration of the COVID-19 declaration under Section 564(b)(1) of the Act, 21 U.S.C. section 360bbb-3(b)(1), unless the authorization is terminated or revoked.  Performed at [facility], [place] [address] [place], [place] [address]    WBC 02/07/2022 8.1  4.0 - 10.5 K/uL Final   RBC 02/07/2022 4.55  3.87 - 5.11 MIL/uL Final   Hemoglobin 02/07/2022 13.6  12.0 - 15.0 g/dL Final   HCT 02/07/2022 41.9  36.0 - 46.0 % Final   MCV 02/07/2022 92.1  80.0 - 100.0 fL Final   MCH 02/07/2022 29.9  26.0 - 34.0 pg Final   MCHC 02/07/2022 32.5  30.0 - 36.0 g/dL Final   RDW 02/07/2022 12.9  11.5 - 15.5 % Final   Platelets 02/07/2022 286  150 - 400 K/uL Final   nRBC 02/07/2022 0.0   0.0 - 0.2 % Final   Neutrophils Relative % 02/07/2022 41  % Final   Neutro Abs 02/07/2022 3.3  1.7 - 7.7 K/uL Final   Lymphocytes Relative 02/07/2022 53  % Final   Lymphs Abs 02/07/2022 4.3 (H)  0.7 - 4.0 K/uL Final   Monocytes Relative 02/07/2022 6  % Final   Monocytes Absolute 02/07/2022 0.5  0.1 - 1.0 K/uL Final   Eosinophils Relative 02/07/2022 0  % Final   Eosinophils Absolute 02/07/2022 0.0  0.0 - 0.5 K/uL Final   Basophils Relative 02/07/2022 0  % Final   Basophils Absolute 02/07/2022 0.0  0.0 - 0.1 K/uL Final   Immature Granulocytes 02/07/2022 0  % Final   Abs Immature Granulocytes 02/07/2022 0.02  0.00 - 0.07 K/uL Final   Performed at [facility], [place] [address], [place], [place] [address]   Sodium 02/07/2022 137  135 - 145 mmol/L Final   Potassium 02/07/2022 4.1  3.5 - 5.1 mmol/L Final   Chloride 02/07/2022 104  98 - 111 mmol/L Final   CO2 02/07/2022 29  22 - 32 mmol/L Final   Glucose, Bld 02/07/2022 92  70 - 99 mg/dL Final   Glucose reference range applies only to samples taken after fasting for at least 8 hours.   BUN 02/07/2022 10  6 -  20 mg/dL Final   Creatinine, Ser 02/07/2022 0.88  0.44 - 1.00 mg/dL Final   Calcium 02/07/2022 9.4  8.9 - 10.3 mg/dL Final   Total Protein 02/07/2022 7.2  6.5 - 8.1 g/dL Final   Albumin 02/07/2022 3.9  3.5 - 5.0 g/dL Final   AST 02/07/2022 18  15 - 41 U/L Final   ALT 02/07/2022 25  0 - 44 U/L Final   Alkaline Phosphatase 02/07/2022 66  38 - 126 U/L Final   Total Bilirubin 02/07/2022 0.2 (L)  0.3 - 1.2 mg/dL Final   GFR, Estimated 02/07/2022 >60  >60 mL/min Final   Comment: (NOTE) Calculated using the CKD-EPI Creatinine Equation (2021)    Anion gap 02/07/2022 4 (L)  5 - 15 Final   Performed at [facility] [address], [place], [place] [address]   Hgb A1c MFr Bld 02/07/2022 5.0  4.8 - 5.6 % Final   Comment: (NOTE) Pre diabetes:          5.7%-6.4%  Diabetes:              >6.4%  Glycemic control for   <7.0% adults  with diabetes    Mean Plasma Glucose 02/07/2022 96.8  mg/dL Final   Performed at [facility] [address] [place], [place] [address]   Alcohol, Ethyl (B) 02/07/2022 <10  <10 mg/dL Final   Comment: (NOTE) Lowest detectable limit for serum alcohol is 10 mg/dL.  For medical purposes only. Performed at [facility], [place] [address], [place], [place] [address]    Cholesterol 02/07/2022 192  0 - 200 mg/dL Final   Triglycerides 02/07/2022 192 (H)  <150 mg/dL Final   HDL 02/07/2022 46  >40 mg/dL Final   Total CHOL/HDL Ratio 02/07/2022 4.2  RATIO Final   VLDL 02/07/2022 38  0 - 40 mg/dL Final   LDL Cholesterol 02/07/2022 108 (H)  0 - 99 mg/dL Final   Comment:        Total Cholesterol/HDL:CHD Risk Coronary Heart Disease Risk Table                     Men   Women  1/2 Average Risk   3.4   3.3  Average Risk       5.0   4.4  2 X Average Risk   9.6   7.1  3 X Average Risk  23.4   11.0        Use the calculated Patient Ratio above and the CHD Risk Table to determine the patient's CHD Risk.        ATP III CLASSIFICATION (LDL):  <100     mg/dL   Optimal  100-129  mg/dL   Near or Above                    Optimal  130-159  mg/dL   Borderline  160-189  mg/dL   High  >190     mg/dL   Very High Performed at [facility] [address], [place], [place] [address]    TSH 02/07/2022 6.344 (H)  0.350 - 4.500 uIU/mL Final   Comment: Performed by a 3rd Generation assay with a functional sensitivity of <=0.01 uIU/mL. Performed at [facility], [place] [address] [place], [place] [address]    POC Amphetamine UR 02/07/2022 None Detected  NONE DETECTED (Cut Off Level 1000 ng/mL) Final   POC Secobarbital (BAR) 02/07/2022 None  Detected  NONE DETECTED (Cut Off Level 300 ng/mL) Final   POC Buprenorphine (BUP) 02/07/2022 None Detected  NONE DETECTED (Cut Off Level 10 ng/mL) Final   POC Oxazepam (BZO) 02/07/2022 None Detected  NONE DETECTED (Cut Off Level 300 ng/mL) Final   POC  Cocaine UR 02/07/2022 None Detected  NONE DETECTED (Cut Off Level 300 ng/mL) Final   POC Methamphetamine UR 02/07/2022 None Detected  NONE DETECTED (Cut Off Level 1000 ng/mL) Final   POC Morphine 02/07/2022 None Detected  NONE DETECTED (Cut Off Level 300 ng/mL) Final   POC Oxycodone UR 02/07/2022 None Detected  NONE DETECTED (Cut Off Level 100 ng/mL) Final   POC Methadone UR 02/07/2022 None Detected  NONE DETECTED (Cut Off Level 300 ng/mL) Final   POC Marijuana UR 02/07/2022 None Detected  NONE DETECTED (Cut Off Level 50 ng/mL) Final   SARSCOV2ONAVIRUS 2 AG 02/07/2022 NEGATIVE  NEGATIVE Final   Comment: (NOTE) SARS-CoV-2 antigen NOT DETECTED.   Negative results are presumptive.  Negative results do not preclude SARS-CoV-2 infection and should not be used as the sole basis for treatment or other patient management decisions, including infection  control decisions, particularly in the presence of clinical signs and  symptoms consistent with COVID-19, or in those who have been in contact with the virus.  Negative results must be combined with clinical observations, patient history, and epidemiological information. The expected result is Negative.  Fact Sheet for Patients: HandmadeRecipes.com.cy  Fact Sheet for Healthcare Providers: FuneralLife.at  This test is not yet approved or cleared by the [place] FDA and  has been authorized for detection and/or diagnosis of SARS-CoV-2 by FDA under an Emergency Use Authorization (EUA).  This EUA will remain in effect (meaning this test can be used) for the duration of  the COV                          ID-19 declaration under Section 564(b)(1) of the Act, 21 U.S.C. section 360bbb-3(b)(1), unless the authorization is terminated or revoked sooner.     Preg Test, Ur 02/07/2022 NEGATIVE  NEGATIVE Final   Comment:        THE SENSITIVITY OF THIS METHODOLOGY IS >24 mIU/mL     Blood Alcohol level:   Lab Results  Component Value Date   ETH <10 07/04/2022   ETH <10 [id number]    Metabolic Disorder Labs: Lab Results  Component Value Date   HGBA1C 5.0 06/07/2022   MPG 96.8 06/07/2022   MPG 96.8 02/07/2022   No results found for: "PROLACTIN" Lab Results  Component Value Date   CHOL 181 07/25/2022   TRIG 118 07/25/2022   HDL 45 07/25/2022   CHOLHDL 4.0 07/25/2022   VLDL 24 07/25/2022   LDLCALC 112 (H) 07/25/2022   LDLCALC 88 06/07/2022    Therapeutic Lab Levels: No results found for: "LITHIUM" No results found for: "VALPROATE" No results found for: "CBMZ"  Physical Findings   GAD-7    Flowsheet Row Office Visit from 12/17/2021 in [facility]  Total GAD-7 Score 17      PHQ2-9    [facility] ED from 06/07/2022 in [facility] Office Visit from 12/17/2021 in [facility]  PHQ-2 Total Score 1 2  PHQ-9 Total Score 2 8      Flowsheet Row ED from 07/25/2022 in [facility] ED from 07/04/2022 in [facility]  ED from 06/14/2022 in [facility] High Risk No Risk No Risk        Musculoskeletal  Strength & Muscle Tone: within normal limits Gait & Station: normal Patient leans: N/A  Psychiatric Specialty Exam  Presentation  General Appearance:  Appropriate for Environment; Casual; Fairly Groomed  Eye Contact: Good  Speech: Clear and Coherent; Normal Rate  Speech Volume: Normal  Handedness: Right   Mood and Affect  Mood: Euthymic  Affect: Appropriate; Congruent; Full Range   Thought Process  Thought Processes: Coherent; Goal Directed; Linear  Descriptions of Associations:Intact  Orientation:Full (Time, Place and Person)  Thought Content:Logical; WDL  Diagnosis of Schizophrenia or Schizoaffective disorder in past: Yes    Hallucinations:Hallucinations:  None   Ideas of Reference:None  Suicidal Thoughts:Suicidal Thoughts: No (Contracted to safety)  Homicidal Thoughts:Homicidal Thoughts: No    Sensorium  Memory: Immediate Good  Judgment: Fair  Insight: Fair   Executive Functions  Concentration: Good  Attention Span: Good  Recall: Good  Fund of Knowledge: Good  Language: Good   Psychomotor Activity  Psychomotor Activity: Psychomotor Activity: Normal    Assets  Assets: Communication Skills; Desire for Improvement   Sleep  Sleep: Sleep: Good (No nightmares)   Physical Exam  Physical Exam Vitals and nursing note reviewed.  Constitutional:      General: She is not in acute distress.    Appearance: She is not ill-appearing or diaphoretic.  HENT:     Head: Normocephalic and atraumatic.  Pulmonary:     Effort: Pulmonary effort is normal. No respiratory distress.  Neurological:     General: No focal deficit present.     Mental Status: She is alert.     Blood pressure 110/85, pulse (!) 110, temperature 98.1 F (36.7 C), temperature source Oral, resp. rate 20, SpO2 98 %. There is no height or weight on file to calculate BMI.  Treatment Plan Summary: [facility] team continues to seek placement for patient.   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  Signed: Merrily Brittle, DO Psychiatry Resident, PGY-2 08/02/2022, 10:44 AM  [place] [address] Dept: [phone] Dept Fax: [phone]

## 2022-08-02 NOTE — ED Notes (Addendum)
Sleeping with no signs of distress skin color WNL.

## 2022-08-03 ENCOUNTER — Encounter (HOSPITAL_COMMUNITY): Payer: Self-pay | Admitting: Registered Nurse

## 2022-08-03 DIAGNOSIS — F333 Major depressive disorder, recurrent, severe with psychotic symptoms: Secondary | ICD-10-CM | POA: Diagnosis not present

## 2022-08-03 NOTE — ED Provider Notes (Signed)
Behavioral Health Progress Note  Date and Time: 08/03/2022 11:58 AM Name: Nicole [name] MRN:  [id number]  Subjective:  "Good"  Nicole [name] 31 y.o., female patient with history of Schizoaffective Disorder, Major depressive disorder with psychosis, Bipolar, and Generalized anxiety disorder who was admitted to the [facility] on 07/25/2022 after a verbal altercation with the group homeowner where she resided. She was threatening to harm the group homeowner and one of the other residents. Patient also was expressing SI, threatening to hang herself. Patient has been seen multiple time in ED/UC with similar/same complaint.  Nicole [name], 31 y.o., female patient seen face to face by this provider, consulted with Dr. Nelly Rout; and chart reviewed on 08/03/22.  On evaluation Nicole [name] reports she feels fine.  She denies suicidal/self-harm/homicidal ideation, psychosis, and paranoia.  Reports she has been tolerating medications with no adverse reaction.  During evaluation Nicole [name] is lying in bed looking at TV.  There is no noted distress.  She is alert, oriented x 4, calm, cooperative and attentive.  Her mood is euthymic with congruent affect.  She has normal speech, and behavior.  Objectively there is no evidence of psychosis/mania or delusional thinking.  Patient is able to converse coherently, goal directed thoughts, no distractibility, or pre-occupation.  She denies suicidal/self-harm/homicidal ideation, psychosis, and paranoia.    Diagnosis:  Final diagnoses:  Severe episode of recurrent major depressive disorder, with psychotic features (HCC)    Total Time spent with patient: 20 minutes  Past Psychiatric History: schizophrenia, schizoaffective disorder, MDD, suicidal ideation Past Medical History:  Past Medical History:  Diagnosis Date   Anxiety    Depression     Past Surgical History:  Procedure Laterality Date   WISDOM TOOTH EXTRACTION Bilateral 2020   Family  History:  Family History  Problem Relation Age of Onset   Hypertension Father    Diabetes Father    Family Psychiatric  History: None reported Social History:  Social History   Substance and Sexual Activity  Alcohol Use Never     Social History   Substance and Sexual Activity  Drug Use Never    Social History   Socioeconomic History   Marital status: Single    Spouse name: Not on file   Number of children: Not on file   Years of education: Not on file   Highest education level: Not on file  Occupational History   Not on file  Tobacco Use   Smoking status: Every Day    Types: Cigarettes   Smokeless tobacco: Never  Vaping Use   Vaping Use: Some days  Substance and Sexual Activity   Alcohol use: Never   Drug use: Never   Sexual activity: Yes    Partners: Female    Birth control/protection: Implant  Other Topics Concern   Not on file  Social History Narrative   Not on file   Social Determinants of Health   Financial Resource Strain: Not on file  Food Insecurity: Not on file  Transportation Needs: Not on file  Physical Activity: Not on file  Stress: Not on file  Social Connections: Not on file   SDOH:  SDOH Screenings   Depression (PHQ2-9): Low Risk  (06/10/2022)  Tobacco Use: High Risk (08/03/2022)   Additional Social History:    Pain Medications: SEE MAR Prescriptions: SEE MAR Over the Counter: SEE MAR History of alcohol / drug use?: Yes Longest period of sobriety (when/how long): N/A Negative Consequences of Use:  (NONE) Withdrawal Symptoms: None Name  of Substance 1: ETOH IN PAST--PT CURRENTLY DENIES 1 - Frequency: SOCIAL USE ETOH IN PAST 1 - Method of Aquiring: LEGAL 1- Route of Use: ORAL DRINK Name of Substance 2: NICOTINE-VAPE AND CIGARETTES 2 - Amount (size/oz): LESS THAN ONE PACK 2 - Frequency: DAILY 2 - Method of Aquiring: LEGAL 2 - Route of Substance Use: ORAL SMOKE                Sleep: Good  Appetite:  Good  Current  Medications:  Current Facility-Administered Medications  Medication Dose Route Frequency Provider Last Rate Last Admin   acetaminophen (TYLENOL) tablet 650 mg  650 mg Oral Q6H PRN Onuoha, Chinwendu V, NP   650 mg at 07/31/22 1328   alum & mag hydroxide-simeth (MAALOX/MYLANTA) 200-200-20 MG/5ML suspension 30 mL  30 mL Oral Q4H PRN Onuoha, Chinwendu V, NP   30 mL at 07/27/22 2151   ARIPiprazole ER (ABILIFY MAINTENA) injection 400 mg  400 mg Intramuscular Q28 days Princess Bruins, DO   400 mg at 08/02/22 1154   clotrimazole (LOTRIMIN) 1 % cream   Topical BID Bobbitt, Shalon E, NP   Given at 08/03/22 1010   hydrOXYzine (ATARAX) tablet 25 mg  25 mg Oral TID PRN Onuoha, Chinwendu V, NP   25 mg at 08/01/22 2214   levothyroxine (SYNTHROID) tablet 100 mcg  100 mcg Oral Q0600 Princess Bruins, DO   100 mcg at 08/03/22 0604   magnesium hydroxide (MILK OF MAGNESIA) suspension 30 mL  30 mL Oral Daily PRN Onuoha, Chinwendu V, NP   30 mL at 08/02/22 1956   nicotine (NICODERM CQ - dosed in mg/24 hours) patch 14 mg  14 mg Transdermal Q0600 Onuoha, Chinwendu V, NP   14 mg at 08/03/22 1015   ondansetron (ZOFRAN) tablet 4 mg  4 mg Oral Once Nwoko, Uchenna E, PA       Oxcarbazepine (TRILEPTAL) tablet 300 mg  300 mg Oral BID Onuoha, Chinwendu V, NP   300 mg at 08/03/22 1011   pantoprazole (PROTONIX) EC tablet 20 mg  20 mg Oral Daily Princess Bruins, DO   20 mg at 08/03/22 1011   QUEtiapine (SEROQUEL) tablet 400 mg  400 mg Oral BID Onuoha, Chinwendu V, NP   400 mg at 08/03/22 1012   senna-docusate (Senokot-S) tablet 1 tablet  1 tablet Oral BID Princess Bruins, DO   1 tablet at 08/03/22 1011   sertraline (ZOLOFT) tablet 150 mg  150 mg Oral Daily Onuoha, Chinwendu V, NP   150 mg at 08/03/22 1012   traZODone (DESYREL) tablet 50 mg  50 mg Oral QHS PRN Onuoha, Chinwendu V, NP   50 mg at 08/02/22 2124   valACYclovir (VALTREX) tablet 500 mg  500 mg Oral Daily Princess Bruins, DO   500 mg at 08/03/22 1012   Current Outpatient Medications   Medication Sig Dispense Refill   ABILIFY MAINTENA 400 MG PRSY prefilled syringe 400 mg every 28 (twenty-eight) days.     cetirizine (ZYRTEC) 10 MG tablet Take 10 mg by mouth daily.     cyclobenzaprine (FLEXERIL) 10 MG tablet Take 1 tablet (10 mg total) by mouth 2 (two) times daily as needed for muscle spasms. 20 tablet 0   fluticasone (FLONASE) 50 MCG/ACT nasal spray Place 1 spray into both nostrils daily.     hydrOXYzine (ATARAX) 50 MG tablet Take 1 tablet (50 mg total) by mouth 2 (two) times daily. 60 tablet 0   meloxicam (MOBIC) 15 MG tablet Take 15 mg  by mouth daily.     Oxcarbazepine (TRILEPTAL) 300 MG tablet Take 1 tablet (300 mg total) by mouth 2 (two) times daily. 60 tablet 0   QUEtiapine (SEROQUEL) 400 MG tablet Take 1 tablet (400 mg total) by mouth 2 (two) times daily. 60 tablet 0   sertraline (ZOLOFT) 50 MG tablet Take 3 tablets (150 mg total) by mouth in the morning. 90 tablet 0   traZODone (DESYREL) 100 MG tablet Take 1 tablet (100 mg total) by mouth at bedtime. 30 tablet 0   valACYclovir (VALTREX) 500 MG tablet Take 500 mg by mouth daily.      Labs  Lab Results:  Admission on 07/25/2022  Component Date Value Ref Range Status   SARS Coronavirus 2 by RT PCR 07/25/2022 NEGATIVE  NEGATIVE Final   Comment: (NOTE) SARS-CoV-2 target nucleic acids are NOT DETECTED.  The SARS-CoV-2 RNA is generally detectable in upper respiratory specimens during the acute phase of infection. The lowest concentration of SARS-CoV-2 viral copies this assay can detect is 138 copies/mL. A negative result does not preclude SARS-Cov-2 infection and should not be used as the sole basis for treatment or other patient management decisions. A negative result may occur with  improper specimen collection/handling, submission of specimen other than nasopharyngeal swab, presence of viral mutation(s) within the areas targeted by this assay, and inadequate number of viral copies(<138 copies/mL). A negative  result must be combined with clinical observations, patient history, and epidemiological information. The expected result is Negative.  Fact Sheet for Patients:  BloggerCourse.com  Fact Sheet for Healthcare Providers:  SeriousBroker.it  This test is no                          t yet approved or cleared by the [place] FDA and  has been authorized for detection and/or diagnosis of SARS-CoV-2 by FDA under an Emergency Use Authorization (EUA). This EUA will remain  in effect (meaning this test can be used) for the duration of the COVID-19 declaration under Section 564(b)(1) of the Act, 21 U.S.C.section 360bbb-3(b)(1), unless the authorization is terminated  or revoked sooner.       Influenza A by PCR 07/25/2022 NEGATIVE  NEGATIVE Final   Influenza B by PCR 07/25/2022 NEGATIVE  NEGATIVE Final   Comment: (NOTE) The Xpert Xpress SARS-CoV-2/FLU/RSV plus assay is intended as an aid in the diagnosis of influenza from Nasopharyngeal swab specimens and should not be used as a sole basis for treatment. Nasal washings and aspirates are unacceptable for Xpert Xpress SARS-CoV-2/FLU/RSV testing.  Fact Sheet for Patients: BloggerCourse.com  Fact Sheet for Healthcare Providers: SeriousBroker.it  This test is not yet approved or cleared by the [place] FDA and has been authorized for detection and/or diagnosis of SARS-CoV-2 by FDA under an Emergency Use Authorization (EUA). This EUA will remain in effect (meaning this test can be used) for the duration of the COVID-19 declaration under Section 564(b)(1) of the Act, 21 U.S.C. section 360bbb-3(b)(1), unless the authorization is terminated or revoked.  Performed at [facility], [address], [place], [place] [address]    WBC 07/25/2022 8.3  4.0 - 10.5 K/uL Final   RBC 07/25/2022 4.44  3.87 - 5.11 MIL/uL Final   Hemoglobin  07/25/2022 13.7  12.0 - 15.0 g/dL Final   HCT 60/45/4098 40.2  36.0 - 46.0 % Final   MCV 07/25/2022 90.5  80.0 - 100.0 fL Final   MCH 07/25/2022 30.9  26.0 -  34.0 pg Final   MCHC 07/25/2022 34.1  30.0 - 36.0 g/dL Final   RDW 16/07/9603 12.2  11.5 - 15.5 % Final   Platelets 07/25/2022 248  150 - 400 K/uL Final   nRBC 07/25/2022 0.0  0.0 - 0.2 % Final   Neutrophils Relative % 07/25/2022 43  % Final   Neutro Abs 07/25/2022 3.6  1.7 - 7.7 K/uL Final   Lymphocytes Relative 07/25/2022 52  % Final   Lymphs Abs 07/25/2022 4.2 (H)  0.7 - 4.0 K/uL Final   Monocytes Relative 07/25/2022 5  % Final   Monocytes Absolute 07/25/2022 0.4  0.1 - 1.0 K/uL Final   Eosinophils Relative 07/25/2022 0  % Final   Eosinophils Absolute 07/25/2022 0.0  0.0 - 0.5 K/uL Final   Basophils Relative 07/25/2022 0  % Final   Basophils Absolute 07/25/2022 0.0  0.0 - 0.1 K/uL Final   Immature Granulocytes 07/25/2022 0  % Final   Abs Immature Granulocytes 07/25/2022 0.02  0.00 - 0.07 K/uL Final   Performed at [facility], [address], [place], [place] [address]   Sodium 07/25/2022 138  135 - 145 mmol/L Final   Potassium 07/25/2022 4.0  3.5 - 5.1 mmol/L Final   Chloride 07/25/2022 104  98 - 111 mmol/L Final   CO2 07/25/2022 29  22 - 32 mmol/L Final   Glucose, Bld 07/25/2022 83  70 - 99 mg/dL Final   Glucose reference range applies only to samples taken after fasting for at least 8 hours.   BUN 07/25/2022 11  6 - 20 mg/dL Final   Creatinine, Ser 07/25/2022 0.97  0.44 - 1.00 mg/dL Final   Calcium 11/91/4782 9.2  8.9 - 10.3 mg/dL Final   Total Protein 95/62/1308 7.0  6.5 - 8.1 g/dL Final   Albumin 65/78/4696 3.8  3.5 - 5.0 g/dL Final   AST 29/52/8413 18  15 - 41 U/L Final   ALT 07/25/2022 22  0 - 44 U/L Final   Alkaline Phosphatase 07/25/2022 64  38 - 126 U/L Final   Total Bilirubin 07/25/2022 0.2 (L)  0.3 - 1.2 mg/dL Final   GFR, Estimated 07/25/2022 >60  >60 mL/min Final   Comment: (NOTE) Calculated using the  CKD-EPI Creatinine Equation (2021)    Anion gap 07/25/2022 5  5 - 15 Final   Performed at [facility], [address] [place], [place] [address]   POC Amphetamine UR 07/25/2022 None Detected  NONE DETECTED (Cut Off Level 1000 ng/mL) Preliminary   POC Secobarbital (BAR) 07/25/2022 None Detected  NONE DETECTED (Cut Off Level 300 ng/mL) Preliminary   POC Buprenorphine (BUP) 07/25/2022 None Detected  NONE DETECTED (Cut Off Level 10 ng/mL) Preliminary   POC Oxazepam (BZO) 07/25/2022 None Detected  NONE DETECTED (Cut Off Level 300 ng/mL) Preliminary   POC Cocaine UR 07/25/2022 None Detected  NONE DETECTED (Cut Off Level 300 ng/mL) Preliminary   POC Methamphetamine UR 07/25/2022 None Detected  NONE DETECTED (Cut Off Level 1000 ng/mL) Preliminary   POC Morphine 07/25/2022 None Detected  NONE DETECTED (Cut Off Level 300 ng/mL) Preliminary   POC Methadone UR 07/25/2022 None Detected  NONE DETECTED (Cut Off Level 300 ng/mL) Preliminary   POC Oxycodone UR 07/25/2022 None Detected  NONE DETECTED (Cut Off Level 100 ng/mL) Preliminary   POC Marijuana UR 07/25/2022 None Detected  NONE DETECTED (Cut Off Level 50 ng/mL) Preliminary   SARSCOV2ONAVIRUS 2 AG 07/25/2022 NEGATIVE  NEGATIVE Final   Comment: (NOTE)  SARS-CoV-2 antigen NOT DETECTED.   Negative results are presumptive.  Negative results do not preclude SARS-CoV-2 infection and should not be used as the sole basis for treatment or other patient management decisions, including infection  control decisions, particularly in the presence of clinical signs and  symptoms consistent with COVID-19, or in those who have been in contact with the virus.  Negative results must be combined with clinical observations, patient history, and epidemiological information. The expected result is Negative.  Fact Sheet for Patients: https://www.jennings-kim.com/  Fact Sheet for Healthcare Providers: https://alexander-rogers.biz/  This  test is not yet approved or cleared by the [place] FDA and  has been authorized for detection and/or diagnosis of SARS-CoV-2 by FDA under an Emergency Use Authorization (EUA).  This EUA will remain in effect (meaning this test can be used) for the duration of  the COV                          ID-19 declaration under Section 564(b)(1) of the Act, 21 U.S.C. section 360bbb-3(b)(1), unless the authorization is terminated or revoked sooner.     Cholesterol 07/25/2022 181  0 - 200 mg/dL Final   Triglycerides 40/98/1191 118  <150 mg/dL Final   HDL 47/82/9562 45  >40 mg/dL Final   Total CHOL/HDL Ratio 07/25/2022 4.0  RATIO Final   VLDL 07/25/2022 24  0 - 40 mg/dL Final   LDL Cholesterol 07/25/2022 112 (H)  0 - 99 mg/dL Final   Comment:        Total Cholesterol/HDL:CHD Risk Coronary Heart Disease Risk Table                     Men   Women  1/2 Average Risk   3.4   3.3  Average Risk       5.0   4.4  2 X Average Risk   9.6   7.1  3 X Average Risk  23.4   11.0        Use the calculated Patient Ratio above and the CHD Risk Table to determine the patient's CHD Risk.        ATP III CLASSIFICATION (LDL):  <100     mg/dL   Optimal  130-865  mg/dL   Near or Above                    Optimal  130-159  mg/dL   Borderline  784-696  mg/dL   High  >295     mg/dL   Very High Performed at [facility], [address] [place], [place] [address]    TSH 07/25/2022 6.668 (H)  0.350 - 4.500 uIU/mL Final   Comment: Performed by a 3rd Generation assay with a functional sensitivity of <=0.01 uIU/mL. Performed at [facility], [address], [place], [place] [address]    Glucose-Capillary 07/26/2022 104 (H)  70 - 99 mg/dL Final   Glucose reference range applies only to samples taken after fasting for at least 8 hours.   T3, Free 07/31/2022 2.3  2.0 - 4.4 pg/mL Final   Comment: (NOTE) Performed At: [facility] [address] [place], [place] [id number] Jolene Schimke MD  QI:[id number]    Free T4 07/31/2022 0.60 (L)  0.61 - 1.12 ng/dL Final   Comment: (NOTE) Biotin ingestion may interfere with free T4 tests. If the results are inconsistent with the TSH level, previous test results, or  the clinical presentation, then consider biotin interference. If needed, order repeat testing after stopping biotin. Performed at [facility], [address] [place], [place] [address]   Admission on 07/04/2022, Discharged on 07/05/2022  Component Date Value Ref Range Status   Sodium 07/04/2022 142  135 - 145 mmol/L Final   Potassium 07/04/2022 4.4  3.5 - 5.1 mmol/L Final   Chloride 07/04/2022 109  98 - 111 mmol/L Final   CO2 07/04/2022 26  22 - 32 mmol/L Final   Glucose, Bld 07/04/2022 96  70 - 99 mg/dL Final   Glucose reference range applies only to samples taken after fasting for at least 8 hours.   BUN 07/04/2022 15  6 - 20 mg/dL Final   Creatinine, Ser 07/04/2022 0.83  0.44 - 1.00 mg/dL Final   Calcium 81/19/1478 9.3  8.9 - 10.3 mg/dL Final   Total Protein 29/56/2130 7.2  6.5 - 8.1 g/dL Final   Albumin 86/57/8469 3.9  3.5 - 5.0 g/dL Final   AST 62/95/2841 23  15 - 41 U/L Final   ALT 07/04/2022 28  0 - 44 U/L Final   Alkaline Phosphatase 07/04/2022 77  38 - 126 U/L Final   Total Bilirubin 07/04/2022 0.4  0.3 - 1.2 mg/dL Final   GFR, Estimated 07/04/2022 >60  >60 mL/min Final   Comment: (NOTE) Calculated using the CKD-EPI Creatinine Equation (2021)    Anion gap 07/04/2022 7  5 - 15 Final   Performed at [facility], [address] [place] [address] [place], [place] [address]   Alcohol, Ethyl (B) 07/04/2022 <10  <10 mg/dL Final   Comment: (NOTE) Lowest detectable limit for serum alcohol is 10 mg/dL.  For medical purposes only. Performed at [facility], [address], [place], [place] [address]    WBC 07/04/2022 7.0  4.0 - 10.5 K/uL Final   RBC 07/04/2022 4.18  3.87 - 5.11 MIL/uL Final   Hemoglobin 07/04/2022 12.8  12.0 - 15.0  g/dL Final   HCT 53/66/4403 39.1  36.0 - 46.0 % Final   MCV 07/04/2022 93.5  80.0 - 100.0 fL Final   MCH 07/04/2022 30.6  26.0 - 34.0 pg Final   MCHC 07/04/2022 32.7  30.0 - 36.0 g/dL Final   RDW 47/42/5956 12.9  11.5 - 15.5 % Final   Platelets 07/04/2022 243  150 - 400 K/uL Final   nRBC 07/04/2022 0.0  0.0 - 0.2 % Final   Neutrophils Relative % 07/04/2022 43  % Final   Neutro Abs 07/04/2022 3.0  1.7 - 7.7 K/uL Final   Lymphocytes Relative 07/04/2022 50  % Final   Lymphs Abs 07/04/2022 3.5  0.7 - 4.0 K/uL Final   Monocytes Relative 07/04/2022 7  % Final   Monocytes Absolute 07/04/2022 0.5  0.1 - 1.0 K/uL Final   Eosinophils Relative 07/04/2022 0  % Final   Eosinophils Absolute 07/04/2022 0.0  0.0 - 0.5 K/uL Final   Basophils Relative 07/04/2022 0  % Final   Basophils Absolute 07/04/2022 0.0  0.0 - 0.1 K/uL Final   Immature Granulocytes 07/04/2022 0  % Final   Abs Immature Granulocytes 07/04/2022 0.01  0.00 - 0.07 K/uL Final   Performed at [facility], [address], [place], [place] [address]   I-stat hCG, quantitative 07/04/2022 <5.0  <5 mIU/mL Final   Comment 3 07/04/2022          Final   Comment:   GEST. AGE  CONC.  (mIU/mL)   <=1 WEEK        5 - 50     2 WEEKS       50 - 500     3 WEEKS       100 - 10,000     4 WEEKS     1,000 - 30,000        FEMALE AND NON-PREGNANT FEMALE:     LESS THAN 5 mIU/mL   Admission on 06/14/2022, Discharged on 06/14/2022  Component Date Value Ref Range Status   Color, UA 06/14/2022 yellow  yellow Final   Clarity, UA 06/14/2022 cloudy (A)  clear Final   Glucose, UA 06/14/2022 negative  negative mg/dL Final   Bilirubin, UA 16/07/9603 negative  negative Final   Ketones, POC UA 06/14/2022 negative  negative mg/dL Final   Spec Grav, UA 54/06/8118 1.025  1.010 - 1.025 Final   Blood, UA 06/14/2022 negative  negative Final   pH, UA 06/14/2022 7.5  5.0 - 8.0 Final   Protein Ur, POC 06/14/2022 =30 (A)  negative mg/dL Final    Urobilinogen, UA 06/14/2022 0.2  0.2 or 1.0 E.U./dL Final   Nitrite, UA 14/78/2956 Negative  Negative Final   Leukocytes, UA 06/14/2022 Negative  Negative Final   Preg Test, Ur 06/14/2022 Negative  Negative Final   Specimen Description 06/14/2022 URINE, CLEAN CATCH   Final   Special Requests 06/14/2022    Final                   Value:NONE Performed at [facility], [address], [place], [place] [address]    Culture 06/14/2022 MULTIPLE SPECIES PRESENT, SUGGEST RECOLLECTION (A)   Final   Report Status 06/14/2022 06/16/2022 FINAL   Final  Admission on 06/07/2022, Discharged on 06/10/2022  Component Date Value Ref Range Status   SARS Coronavirus 2 by RT PCR 06/07/2022 NEGATIVE  NEGATIVE Final   Comment: (NOTE) SARS-CoV-2 target nucleic acids are NOT DETECTED.  The SARS-CoV-2 RNA is generally detectable in upper respiratory specimens during the acute phase of infection. The lowest concentration of SARS-CoV-2 viral copies this assay can detect is 138 copies/mL. A negative result does not preclude SARS-Cov-2 infection and should not be used as the sole basis for treatment or other patient management decisions. A negative result may occur with  improper specimen collection/handling, submission of specimen other than nasopharyngeal swab, presence of viral mutation(s) within the areas targeted by this assay, and inadequate number of viral copies(<138 copies/mL). A negative result must be combined with clinical observations, patient history, and epidemiological information. The expected result is Negative.  Fact Sheet for Patients:  BloggerCourse.com  Fact Sheet for Healthcare Providers:  SeriousBroker.it  This test is no                          t yet approved or cleared by the [place] FDA and  has been authorized for detection and/or diagnosis of SARS-CoV-2 by FDA under an Emergency Use Authorization (EUA). This EUA will  remain  in effect (meaning this test can be used) for the duration of the COVID-19 declaration under Section 564(b)(1) of the Act, 21 U.S.C.section 360bbb-3(b)(1), unless the authorization is terminated  or revoked sooner.       Influenza A by PCR 06/07/2022 NEGATIVE  NEGATIVE Final   Influenza B by PCR 06/07/2022 NEGATIVE  NEGATIVE Final   Comment: (NOTE) The Xpert Xpress SARS-CoV-2/FLU/RSV plus assay  is intended as an aid in the diagnosis of influenza from Nasopharyngeal swab specimens and should not be used as a sole basis for treatment. Nasal washings and aspirates are unacceptable for Xpert Xpress SARS-CoV-2/FLU/RSV testing.  Fact Sheet for Patients: BloggerCourse.com  Fact Sheet for Healthcare Providers: SeriousBroker.it  This test is not yet approved or cleared by the [place] FDA and has been authorized for detection and/or diagnosis of SARS-CoV-2 by FDA under an Emergency Use Authorization (EUA). This EUA will remain in effect (meaning this test can be used) for the duration of the COVID-19 declaration under Section 564(b)(1) of the Act, 21 U.S.C. section 360bbb-3(b)(1), unless the authorization is terminated or revoked.  Performed at [facility], [address] [place], [place] [address]    WBC 06/07/2022 5.7  4.0 - 10.5 K/uL Final   RBC 06/07/2022 4.39  3.87 - 5.11 MIL/uL Final   Hemoglobin 06/07/2022 13.2  12.0 - 15.0 g/dL Final   HCT 60/45/4098 40.4  36.0 - 46.0 % Final   MCV 06/07/2022 92.0  80.0 - 100.0 fL Final   MCH 06/07/2022 30.1  26.0 - 34.0 pg Final   MCHC 06/07/2022 32.7  30.0 - 36.0 g/dL Final   RDW 11/91/4782 12.4  11.5 - 15.5 % Final   Platelets 06/07/2022 308  150 - 400 K/uL Final   nRBC 06/07/2022 0.0  0.0 - 0.2 % Final   Neutrophils Relative % 06/07/2022 42  % Final   Neutro Abs 06/07/2022 2.4  1.7 - 7.7 K/uL Final   Lymphocytes Relative 06/07/2022 54  % Final   Lymphs Abs  06/07/2022 3.1  0.7 - 4.0 K/uL Final   Monocytes Relative 06/07/2022 4  % Final   Monocytes Absolute 06/07/2022 0.2  0.1 - 1.0 K/uL Final   Eosinophils Relative 06/07/2022 0  % Final   Eosinophils Absolute 06/07/2022 0.0  0.0 - 0.5 K/uL Final   Basophils Relative 06/07/2022 0  % Final   Basophils Absolute 06/07/2022 0.0  0.0 - 0.1 K/uL Final   Immature Granulocytes 06/07/2022 0  % Final   Abs Immature Granulocytes 06/07/2022 0.01  0.00 - 0.07 K/uL Final   Performed at [facility], [address] [place], [place] [address]   Sodium 06/07/2022 139  135 - 145 mmol/L Final   Potassium 06/07/2022 4.0  3.5 - 5.1 mmol/L Final   Chloride 06/07/2022 104  98 - 111 mmol/L Final   CO2 06/07/2022 28  22 - 32 mmol/L Final   Glucose, Bld 06/07/2022 104 (H)  70 - 99 mg/dL Final   Glucose reference range applies only to samples taken after fasting for at least 8 hours.   BUN 06/07/2022 8  6 - 20 mg/dL Final   Creatinine, Ser 06/07/2022 0.84  0.44 - 1.00 mg/dL Final   Calcium 30/86/5784 9.1  8.9 - 10.3 mg/dL Final   Total Protein 69/62/9528 6.9  6.5 - 8.1 g/dL Final   Albumin 41/32/4401 3.7  3.5 - 5.0 g/dL Final   AST 02/72/5366 19  15 - 41 U/L Final   ALT 06/07/2022 22  0 - 44 U/L Final   Alkaline Phosphatase 06/07/2022 54  38 - 126 U/L Final   Total Bilirubin 06/07/2022 0.4  0.3 - 1.2 mg/dL Final   GFR, Estimated 06/07/2022 >60  >60 mL/min Final   Comment: (NOTE) Calculated using the CKD-EPI Creatinine Equation (2021)    Anion gap 06/07/2022 7  5 - 15 Final   Performed at  [facility], [place] [address] [place], [place] [address]   Hgb A1c MFr Bld 06/07/2022 5.0  4.8 - 5.6 % Final   Comment: (NOTE) Pre diabetes:          5.7%-6.4%  Diabetes:              >6.4%  Glycemic control for   <7.0% adults with diabetes    Mean Plasma Glucose 06/07/2022 96.8  mg/dL Final   Performed at [facility], [place] [address] [place], [place] [address]   TSH 06/07/2022 1.620  0.350 - 4.500  uIU/mL Final   Comment: Performed by a 3rd Generation assay with a functional sensitivity of <=0.01 uIU/mL. Performed at [facility], [place] [address], [place], [place] [address]    RPR Ser Ql 06/07/2022 NON REACTIVE  NON REACTIVE Final   Performed at [facility], [place] [address], [place], [place] [address]   Color, Urine 06/07/2022 YELLOW  YELLOW Final   APPearance 06/07/2022 HAZY (A)  CLEAR Final   Specific Gravity, Urine 06/07/2022 1.018  1.005 - 1.030 Final   pH 06/07/2022 7.0  5.0 - 8.0 Final   Glucose, UA 06/07/2022 NEGATIVE  NEGATIVE mg/dL Final   Hgb urine dipstick 06/07/2022 NEGATIVE  NEGATIVE Final   Bilirubin Urine 06/07/2022 NEGATIVE  NEGATIVE Final   Ketones, ur 06/07/2022 NEGATIVE  NEGATIVE mg/dL Final   Protein, ur 06/07/2022 NEGATIVE  NEGATIVE mg/dL Final   Nitrite 06/07/2022 NEGATIVE  NEGATIVE Final   Leukocytes,Ua 06/07/2022 NEGATIVE  NEGATIVE Final   Performed at [facility], [place] [address], [place], [place] [address]   Cholesterol 06/07/2022 164  0 - 200 mg/dL Final   Triglycerides 06/07/2022 158 (H)  <150 mg/dL Final   HDL 06/07/2022 44  >40 mg/dL Final   Total CHOL/HDL Ratio 06/07/2022 3.7  RATIO Final   VLDL 06/07/2022 32  0 - 40 mg/dL Final   LDL Cholesterol 06/07/2022 88  0 - 99 mg/dL Final   Comment:        Total Cholesterol/HDL:CHD Risk Coronary Heart Disease Risk Table                     Men   Women  1/2 Average Risk   3.4   3.3  Average Risk       5.0   4.4  2 X Average Risk   9.6   7.1  3 X Average Risk  23.4   11.0        Use the calculated Patient Ratio above and the CHD Risk Table to determine the patient's CHD Risk.        ATP III CLASSIFICATION (LDL):  <100     mg/dL   Optimal  100-129  mg/dL   Near or Above                    Optimal  130-159  mg/dL   Borderline  160-189  mg/dL   High  >190     mg/dL   Very High Performed at [place] [address], [place], [place] [address]    HIV Screen 4th Generation  wRfx 06/07/2022 Non Reactive  Non Reactive Final   Performed at [facility], [place] [address] [place],  [address]   SARSCOV2ONAVIRUS 2 AG 06/07/2022 NEGATIVE  NEGATIVE Final   Comment: (NOTE) SARS-CoV-2 antigen NOT DETECTED.   Negative results are presumptive.  Negative  results do not preclude SARS-CoV-2 infection and should not be used as the sole basis for treatment or other patient management decisions, including infection  control decisions, particularly in the presence of clinical signs and  symptoms consistent with COVID-19, or in those who have been in contact with the virus.  Negative results must be combined with clinical observations, patient history, and epidemiological information. The expected result is Negative.  Fact Sheet for Patients: https://www.jennings-kim.com/  Fact Sheet for Healthcare Providers: https://alexander-rogers.biz/  This test is not yet approved or cleared by the [place] FDA and  has been authorized for detection and/or diagnosis of SARS-CoV-2 by FDA under an Emergency Use Authorization (EUA).  This EUA will remain in effect (meaning this test can be used) for the duration of  the COV                          ID-19 declaration under Section 564(b)(1) of the Act, 21 U.S.C. section 360bbb-3(b)(1), unless the authorization is terminated or revoked sooner.     POC Amphetamine UR 06/07/2022 None Detected  NONE DETECTED (Cut Off Level 1000 ng/mL) Final   POC Secobarbital (BAR) 06/07/2022 None Detected  NONE DETECTED (Cut Off Level 300 ng/mL) Final   POC Buprenorphine (BUP) 06/07/2022 None Detected  NONE DETECTED (Cut Off Level 10 ng/mL) Final   POC Oxazepam (BZO) 06/07/2022 None Detected  NONE DETECTED (Cut Off Level 300 ng/mL) Final   POC Cocaine UR 06/07/2022 None Detected  NONE DETECTED (Cut Off Level 300 ng/mL) Final   POC Methamphetamine UR 06/07/2022 None Detected  NONE DETECTED (Cut Off Level 1000 ng/mL)  Final   POC Morphine 06/07/2022 None Detected  NONE DETECTED (Cut Off Level 300 ng/mL) Final   POC Methadone UR 06/07/2022 None Detected  NONE DETECTED (Cut Off Level 300 ng/mL) Final   POC Oxycodone UR 06/07/2022 None Detected  NONE DETECTED (Cut Off Level 100 ng/mL) Final   POC Marijuana UR 06/07/2022 None Detected  NONE DETECTED (Cut Off Level 50 ng/mL) Final   Preg Test, Ur 06/08/2022 NEGATIVE  NEGATIVE Final   Comment:        THE SENSITIVITY OF THIS METHODOLOGY IS >20 mIU/mL. Performed at [facility], [address], [place], [place] [address]    Preg Test, Ur 06/08/2022 NEGATIVE  NEGATIVE Final   Comment:        THE SENSITIVITY OF THIS METHODOLOGY IS >24 mIU/mL   Admission on 02/07/2022, Discharged on 02/08/2022  Component Date Value Ref Range Status   SARS Coronavirus 2 by RT PCR 02/07/2022 NEGATIVE  NEGATIVE Final   Comment: (NOTE) SARS-CoV-2 target nucleic acids are NOT DETECTED.  The SARS-CoV-2 RNA is generally detectable in upper respiratory specimens during the acute phase of infection. The lowest concentration of SARS-CoV-2 viral copies this assay can detect is 138 copies/mL. A negative result does not preclude SARS-Cov-2 infection and should not be used as the sole basis for treatment or other patient management decisions. A negative result may occur with  improper specimen collection/handling, submission of specimen other than nasopharyngeal swab, presence of viral mutation(s) within the areas targeted by this assay, and inadequate number of viral copies(<138 copies/mL). A negative result must be combined with clinical observations, patient history, and epidemiological information. The expected result is Negative.  Fact Sheet for Patients:  BloggerCourse.com  Fact Sheet for Healthcare Providers:  SeriousBroker.it  This test is no  t yet approved or cleared by the [place]  and  has been authorized for detection and/or diagnosis of SARS-CoV-2 by FDA under an Emergency Use Authorization (EUA). This EUA will remain  in effect (meaning this test can be used) for the duration of the COVID-19 declaration under Section 564(b)(1) of the Act, 21 U.S.C.section 360bbb-3(b)(1), unless the authorization is terminated  or revoked sooner.       Influenza A by PCR 02/07/2022 NEGATIVE  NEGATIVE Final   Influenza B by PCR 02/07/2022 NEGATIVE  NEGATIVE Final   Comment: (NOTE) The Xpert Xpress SARS-CoV-2/FLU/RSV plus assay is intended as an aid in the diagnosis of influenza from Nasopharyngeal swab specimens and should not be used as a sole basis for treatment. Nasal washings and aspirates are unacceptable for Xpert Xpress SARS-CoV-2/FLU/RSV testing.  Fact Sheet for Patients: BloggerCourse.com  Fact Sheet for Healthcare Providers: SeriousBroker.it  This test is not yet approved or cleared by the [place] FDA and has been authorized for detection and/or diagnosis of SARS-CoV-2 by FDA under an Emergency Use Authorization (EUA). This EUA will remain in effect (meaning this test can be used) for the duration of the COVID-19 declaration under Section 564(b)(1) of the Act, 21 U.S.C. section 360bbb-3(b)(1), unless the authorization is terminated or revoked.  Performed at [facility], [address] [place], [place] [address]    WBC 02/07/2022 8.1  4.0 - 10.5 K/uL Final   RBC 02/07/2022 4.55  3.87 - 5.11 MIL/uL Final   Hemoglobin 02/07/2022 13.6  12.0 - 15.0 g/dL Final   HCT 48/54/6270 41.9  36.0 - 46.0 % Final   MCV 02/07/2022 92.1  80.0 - 100.0 fL Final   MCH 02/07/2022 29.9  26.0 - 34.0 pg Final   MCHC 02/07/2022 32.5  30.0 - 36.0 g/dL Final   RDW 35/00/9381 12.9  11.5 - 15.5 % Final   Platelets 02/07/2022 286  150 - 400 K/uL Final   nRBC 02/07/2022 0.0  0.0 - 0.2 % Final   Neutrophils Relative %  02/07/2022 41  % Final   Neutro Abs 02/07/2022 3.3  1.7 - 7.7 K/uL Final   Lymphocytes Relative 02/07/2022 53  % Final   Lymphs Abs 02/07/2022 4.3 (H)  0.7 - 4.0 K/uL Final   Monocytes Relative 02/07/2022 6  % Final   Monocytes Absolute 02/07/2022 0.5  0.1 - 1.0 K/uL Final   Eosinophils Relative 02/07/2022 0  % Final   Eosinophils Absolute 02/07/2022 0.0  0.0 - 0.5 K/uL Final   Basophils Relative 02/07/2022 0  % Final   Basophils Absolute 02/07/2022 0.0  0.0 - 0.1 K/uL Final   Immature Granulocytes 02/07/2022 0  % Final   Abs Immature Granulocytes 02/07/2022 0.02  0.00 - 0.07 K/uL Final   Performed at [facility], [address] [place], [place] [address]   Sodium 02/07/2022 137  135 - 145 mmol/L Final   Potassium 02/07/2022 4.1  3.5 - 5.1 mmol/L Final   Chloride 02/07/2022 104  98 - 111 mmol/L Final   CO2 02/07/2022 29  22 - 32 mmol/L Final   Glucose, Bld 02/07/2022 92  70 - 99 mg/dL Final   Glucose reference range applies only to samples taken after fasting for at least 8 hours.   BUN 02/07/2022 10  6 - 20 mg/dL Final   Creatinine, Ser 02/07/2022 0.88  0.44 - 1.00 mg/dL Final   Calcium 71/69/6789 9.4  8.9 - 10.3 mg/dL Final  Total Protein 02/07/2022 7.2  6.5 - 8.1 g/dL Final   Albumin 62/13/086504/27/2023 3.9  3.5 - 5.0 g/dL Final   AST 78/46/962904/27/2023 18  15 - 41 U/L Final   ALT 02/07/2022 25  0 - 44 U/L Final   Alkaline Phosphatase 02/07/2022 66  38 - 126 U/L Final   Total Bilirubin 02/07/2022 0.2 (L)  0.3 - 1.2 mg/dL Final   GFR, Estimated 02/07/2022 >60  >60 mL/min Final   Comment: (NOTE) Calculated using the CKD-EPI Creatinine Equation (2021)    Anion gap 02/07/2022 4 (L)  5 - 15 Final   Performed at [facility], [address] [place], [place] [address]   Hgb A1c MFr Bld 02/07/2022 5.0  4.8 - 5.6 % Final   Comment: (NOTE) Pre diabetes:          5.7%-6.4%  Diabetes:              >6.4%  Glycemic control for   <7.0% adults with diabetes    Mean Plasma Glucose  02/07/2022 96.8  mg/dL Final   Performed at [facility], [address] [place], [place] [address]   Alcohol, Ethyl (B) 02/07/2022 <10  <10 mg/dL Final   Comment: (NOTE) Lowest detectable limit for serum alcohol is 10 mg/dL.  For medical purposes only. Performed at [facility], [address] [place], [place] [address]    Cholesterol 02/07/2022 192  0 - 200 mg/dL Final   Triglycerides 53/66/440304/27/2023 192 (H)  <150 mg/dL Final   HDL 47/42/595604/27/2023 46  >40 mg/dL Final   Total CHOL/HDL Ratio 02/07/2022 4.2  RATIO Final   VLDL 02/07/2022 38  0 - 40 mg/dL Final   LDL Cholesterol 02/07/2022 108 (H)  0 - 99 mg/dL Final   Comment:        Total Cholesterol/HDL:CHD Risk Coronary Heart Disease Risk Table                     Men   Women  1/2 Average Risk   3.4   3.3  Average Risk       5.0   4.4  2 X Average Risk   9.6   7.1  3 X Average Risk  23.4   11.0        Use the calculated Patient Ratio above and the CHD Risk Table to determine the patient's CHD Risk.        ATP III CLASSIFICATION (LDL):  <100     mg/dL   Optimal  [id number]  mg/dL   Near or Above                    Optimal  130-159  mg/dL   Borderline  [id number]  mg/dL   High  >884>190     mg/dL   Very High Performed at [facility], [address] [place], [place] [address]    TSH 02/07/2022 6.344 (H)  0.350 - 4.500 uIU/mL Final   Comment: Performed by a 3rd Generation assay with a functional sensitivity of <=0.01 uIU/mL. Performed at [facility], [address] [place], [place] [address]    POC Amphetamine UR 02/07/2022 None Detected  NONE DETECTED (Cut Off Level 1000 ng/mL) Final   POC Secobarbital (BAR) 02/07/2022 None Detected  NONE DETECTED (Cut Off Level 300 ng/mL) Final   POC Buprenorphine (BUP) 02/07/2022 None Detected  NONE DETECTED (Cut Off Level 10 ng/mL) Final  POC Oxazepam (BZO) 02/07/2022 None Detected  NONE DETECTED (Cut Off Level 300 ng/mL) Final   POC Cocaine UR 02/07/2022 None Detected  NONE  DETECTED (Cut Off Level 300 ng/mL) Final   POC Methamphetamine UR 02/07/2022 None Detected  NONE DETECTED (Cut Off Level 1000 ng/mL) Final   POC Morphine 02/07/2022 None Detected  NONE DETECTED (Cut Off Level 300 ng/mL) Final   POC Oxycodone UR 02/07/2022 None Detected  NONE DETECTED (Cut Off Level 100 ng/mL) Final   POC Methadone UR 02/07/2022 None Detected  NONE DETECTED (Cut Off Level 300 ng/mL) Final   POC Marijuana UR 02/07/2022 None Detected  NONE DETECTED (Cut Off Level 50 ng/mL) Final   SARSCOV2ONAVIRUS 2 AG 02/07/2022 NEGATIVE  NEGATIVE Final   Comment: (NOTE) SARS-CoV-2 antigen NOT DETECTED.   Negative results are presumptive.  Negative results do not preclude SARS-CoV-2 infection and should not be used as the sole basis for treatment or other patient management decisions, including infection  control decisions, particularly in the presence of clinical signs and  symptoms consistent with COVID-19, or in those who have been in contact with the virus.  Negative results must be combined with clinical observations, patient history, and epidemiological information. The expected result is Negative.  Fact Sheet for Patients: https://www.jennings-kim.com/  Fact Sheet for Healthcare Providers: https://alexander-rogers.biz/  This test is not yet approved or cleared by the [place] FDA and  has been authorized for detection and/or diagnosis of SARS-CoV-2 by FDA under an Emergency Use Authorization (EUA).  This EUA will remain in effect (meaning this test can be used) for the duration of  the COV                          ID-19 declaration under Section 564(b)(1) of the Act, 21 U.S.C. section 360bbb-3(b)(1), unless the authorization is terminated or revoked sooner.     Preg Test, Ur 02/07/2022 NEGATIVE  NEGATIVE Final   Comment:        THE SENSITIVITY OF THIS METHODOLOGY IS >24 mIU/mL     Blood Alcohol level:  Lab Results  Component Value Date    ETH <10 07/04/2022   ETH <10 02/07/2022    Metabolic Disorder Labs: Lab Results  Component Value Date   HGBA1C 5.0 06/07/2022   MPG 96.8 06/07/2022   MPG 96.8 02/07/2022   No results found for: "PROLACTIN" Lab Results  Component Value Date   CHOL 181 07/25/2022   TRIG 118 07/25/2022   HDL 45 07/25/2022   CHOLHDL 4.0 07/25/2022   VLDL 24 07/25/2022   LDLCALC 112 (H) 07/25/2022   LDLCALC 88 06/07/2022    Therapeutic Lab Levels: No results found for: "LITHIUM" No results found for: "VALPROATE" No results found for: "CBMZ"  Physical Findings   GAD-7    Flowsheet Row [facility]17/2021 [facility]  Total GAD-7 Score 17      PHQ2-9    Flowsheet Row ED from 06/07/2022 in [facility] Office Visit from 12/17/2021 [facility]  PHQ-2 Total Score 1 2  PHQ-9 Total Score 2 8      Flowsheet Row ED from 07/25/2022 in [facility] ED from 07/04/2022 in [facility] from 06/14/2022 in [facility]   C-SSRS RISK CATEGORY High Risk No Risk No Risk  Musculoskeletal  Strength & Muscle Tone: within normal limits Gait & Station: normal Patient leans: N/A  Psychiatric Specialty Exam  Presentation  General Appearance:  Appropriate for Environment  Eye Contact: Good  Speech: Clear and Coherent; Normal Rate  Speech Volume: Normal  Handedness: Right   Mood and Affect  Mood: Euthymic  Affect: Appropriate; Congruent   Thought Process  Thought Processes: Coherent  Descriptions of Associations:Intact  Orientation:Full (Time, Place and Person)  Thought Content:Logical  Diagnosis of Schizophrenia or Schizoaffective disorder in past: No    Hallucinations:Hallucinations: None  Ideas of Reference:None  Suicidal Thoughts:Suicidal Thoughts: No  Homicidal  Thoughts:Homicidal Thoughts: No   Sensorium  Memory: Immediate Good; Recent Good  Judgment: Fair  Insight: Fair   Art therapist  Concentration: Good  Attention Span: Good  Recall: Good  Fund of Knowledge: Good  Language: Good   Psychomotor Activity  Psychomotor Activity: Psychomotor Activity: Normal   Assets  Assets: Communication Skills; Desire for Improvement   Sleep  Sleep: Sleep: Good   Nutritional Assessment (For OBS and FBC admissions only) Has the patient had a weight loss or gain of 10 pounds or more in the last 3 months?: No Has the patient had a decrease in food intake/or appetite?: No Does the patient have dental problems?: No Does the patient have eating habits or behaviors that may be indicators of an eating disorder including binging or inducing vomiting?: No Has the patient recently lost weight without trying?: 0 Has the patient been eating poorly because of a decreased appetite?: 0 Malnutrition Screening Tool Score: 0    Physical Exam  Physical Exam Vitals and nursing note reviewed.  Constitutional:      General: She is not in acute distress.    Appearance: She is not ill-appearing or diaphoretic.  HENT:     Head: Normocephalic and atraumatic.  Pulmonary:     Effort: Pulmonary effort is normal. No respiratory distress.  Neurological:     General: No focal deficit present.     Mental Status: She is alert.  Psychiatric:        Attention and Perception: Attention and perception normal. She does not perceive auditory or visual hallucinations.        Mood and Affect: Mood and affect normal.        Speech: Speech normal.        Behavior: Behavior normal. Behavior is cooperative.        Thought Content: Thought content is not paranoid or delusional. Thought content does not include homicidal or suicidal ideation.    Review of Systems  Respiratory:  Negative for shortness of breath.   Cardiovascular:  Negative for chest  pain.  Gastrointestinal:  Negative for nausea and vomiting.  Neurological:  Negative for dizziness and headaches.  Psychiatric/Behavioral:  Negative for hallucinations, substance abuse and suicidal ideas. The patient is not nervous/anxious.    Blood pressure 107/62, pulse (!) 106, temperature 98.4 F (36.9 C), temperature source Oral, resp. rate 20, SpO2 98 %. There is no height or weight on file to calculate BMI.  Treatment Plan Summary: Plan Psychiatrically cleared awaiting placement and documents from legal guardian.  Social work continue to work with patient, DSS, and leal guardian.  No changes in medications at this time.    Riddik Senna, NP 08/03/2022 11:58 AM

## 2022-08-03 NOTE — ED Notes (Signed)
Patient watching TV. Respirations equal and unlabored, skin warm and dry, NAD. Routine safety checks conducted according to facility protocol. Will continue to monitor for safety.  

## 2022-08-03 NOTE — ED Notes (Signed)
Pt Aox4, denies pain, SI, HI, and AVH.  Pt currently watching a movie.  She reports having a large bm today, stating it was very hard.  Encouraged pt to drink water and informed her she would be receiving a senna tablet in her night medications this evening. Breathing is even and unlabored. Will continue to monitor for safety.

## 2022-08-03 NOTE — ED Notes (Signed)
Patient resting quietly in bed with eyes closed. Respirations equal and unlabored, skin warm and dry, NAD. Routine safety checks conducted according to facility protocol. Will continue to monitor for safety.  

## 2022-08-03 NOTE — ED Notes (Signed)
Pt showered and clean scrubs given. 

## 2022-08-03 NOTE — ED Notes (Signed)
Patient observed/assessed in bed/chair resting quietly appearing with no distress and verbalizing no complaints at this time. Will continue to monitor.  

## 2022-08-04 NOTE — ED Notes (Signed)
Pt resting quietly with eyes closed.  No pain or discomfort noted/voiced.  Breathing is even and unlabored.  Will continue to monitor for safety.  

## 2022-08-04 NOTE — ED Notes (Signed)
[name]&Ox4. Denies SI/HI and A/VH. Patient denies any physical complaints when asked. No acute distress noted. Support and encouragement provided. Routine safety checks conducted according to facility protocol. Encouraged patient to notify staff if thoughts of harm toward self or others arise. Patient verbalize understanding and agreement. Will continue to monitor for safety.    

## 2022-08-04 NOTE — ED Provider Notes (Signed)
Behavioral Health Progress Note  Date and Time: 08/04/2022 9:13 AM Name: Nicole [name] MRN:  [id number]  Subjective:    Diagnosis:  Final diagnoses:  Severe episode of recurrent major depressive disorder, with psychotic features (HCC)   Nicole [name], 31 y.o., female patient seen face to face by this provider, consulted with Dr. Lucianne Muss; and chart reviewed on 08/04/22.    Total Time spent with patient: 20 minutes  Nicole [name] 31 y.o., female with history schizophrenia, MDD,  Bipolar, and Generalized anxiety admitted to [facility] on voluntarily on 07/25/2022 after being dismissed from her group home. She is currently boarding here. She denies SI/HI/AH/VH. She reports no acute concerns.    During evaluation Nicole [name] is in no acute distress.  She is alert, oriented x 4, calm, cooperative and attentive.  Her mood is euthymic with congruent affect.  She has normal speech, and behavior.  Objectively there is no evidence of psychosis/mania or delusional thinking.  Patient is able to converse coherently, goal directed thoughts, no distractibility, or pre-occupation.  She also denies suicidal/self-harm/homicidal ideation, psychosis, and paranoia.  Patient answered question appropriately.      Past Medical History:  Past Medical History:  Diagnosis Date   Anxiety    Depression     Past Surgical History:  Procedure Laterality Date   WISDOM TOOTH EXTRACTION Bilateral 2020   Family History:  Family History  Problem Relation Age of Onset   Hypertension Father    Diabetes Father     Social History:  Social History   Substance and Sexual Activity  Alcohol Use Never     Social History   Substance and Sexual Activity  Drug Use Never    Social History   Socioeconomic History   Marital status: Single    Spouse name: Not on file   Number of children: Not on file   Years of education: Not on file   Highest education level: Not on file  Occupational History   Not on file   Tobacco Use   Smoking status: Every Day    Types: Cigarettes   Smokeless tobacco: Never  Vaping Use   Vaping Use: Some days  Substance and Sexual Activity   Alcohol use: Never   Drug use: Never   Sexual activity: Yes    Partners: Female    Birth control/protection: Implant  Other Topics Concern   Not on file  Social History Narrative   Not on file   Social Determinants of Health   Financial Resource Strain: Not on file  Food Insecurity: Not on file  Transportation Needs: Not on file  Physical Activity: Not on file  Stress: Not on file  Social Connections: Not on file   SDOH:  SDOH Screenings   Depression (PHQ2-9): Low Risk  (06/10/2022)  Tobacco Use: High Risk (08/03/2022)   Additional Social History:    Pain Medications: SEE MAR Prescriptions: SEE MAR Over the Counter: SEE MAR History of alcohol / drug use?: Yes Longest period of sobriety (when/how long): N/A Negative Consequences of Use:  (NONE) Withdrawal Symptoms: None Name of Substance 1: ETOH IN PAST--PT CURRENTLY DENIES 1 - Frequency: SOCIAL USE ETOH IN PAST 1 - Method of Aquiring: LEGAL 1- Route of Use: ORAL DRINK Name of Substance 2: NICOTINE-VAPE AND CIGARETTES 2 - Amount (size/oz): LESS THAN ONE PACK 2 - Frequency: DAILY 2 - Method of Aquiring: LEGAL 2 - Route of Substance Use: ORAL SMOKE  Sleep: Good  Appetite:  Good  Current Medications:  Current Facility-Administered Medications  Medication Dose Route Frequency Provider Last Rate Last Admin   acetaminophen (TYLENOL) tablet 650 mg  650 mg Oral Q6H PRN Onuoha, Chinwendu V, NP   650 mg at 07/31/22 1328   alum & mag hydroxide-simeth (MAALOX/MYLANTA) 200-200-20 MG/5ML suspension 30 mL  30 mL Oral Q4H PRN Onuoha, Chinwendu V, NP   30 mL at 07/27/22 2151   ARIPiprazole ER (ABILIFY MAINTENA) injection 400 mg  400 mg Intramuscular Q28 days Merrily Brittle, DO   400 mg at 08/02/22 1154   clotrimazole (LOTRIMIN) 1 % cream   Topical  BID Bobbitt, Shalon E, NP   Given at 08/04/22 9169   hydrOXYzine (ATARAX) tablet 25 mg  25 mg Oral TID PRN Onuoha, Chinwendu V, NP   25 mg at 08/01/22 2214   levothyroxine (SYNTHROID) tablet 100 mcg  100 mcg Oral Q0600 Merrily Brittle, DO   100 mcg at 08/04/22 0507   magnesium hydroxide (MILK OF MAGNESIA) suspension 30 mL  30 mL Oral Daily PRN Onuoha, Chinwendu V, NP   30 mL at 08/02/22 1956   nicotine (NICODERM CQ - dosed in mg/24 hours) patch 14 mg  14 mg Transdermal Q0600 Onuoha, Chinwendu V, NP   14 mg at 08/04/22 0850   ondansetron (ZOFRAN) tablet 4 mg  4 mg Oral Once Nwoko, Uchenna E, PA       Oxcarbazepine (TRILEPTAL) tablet 300 mg  300 mg Oral BID Onuoha, Chinwendu V, NP   300 mg at 08/04/22 0904   pantoprazole (PROTONIX) EC tablet 20 mg  20 mg Oral Daily Merrily Brittle, DO   20 mg at 08/04/22 0903   QUEtiapine (SEROQUEL) tablet 400 mg  400 mg Oral BID Onuoha, Chinwendu V, NP   400 mg at 08/04/22 4503   senna-docusate (Senokot-S) tablet 1 tablet  1 tablet Oral BID Merrily Brittle, DO   1 tablet at 08/04/22 0903   sertraline (ZOLOFT) tablet 150 mg  150 mg Oral Daily Onuoha, Chinwendu V, NP   150 mg at 08/04/22 0903   traZODone (DESYREL) tablet 50 mg  50 mg Oral QHS PRN Onuoha, Chinwendu V, NP   50 mg at 08/03/22 2116   valACYclovir (VALTREX) tablet 500 mg  500 mg Oral Daily Merrily Brittle, DO   500 mg at 08/04/22 8882   Current Outpatient Medications  Medication Sig Dispense Refill   ABILIFY MAINTENA 400 MG PRSY prefilled syringe 400 mg every 28 (twenty-eight) days.     cetirizine (ZYRTEC) 10 MG tablet Take 10 mg by mouth daily.     cyclobenzaprine (FLEXERIL) 10 MG tablet Take 1 tablet (10 mg total) by mouth 2 (two) times daily as needed for muscle spasms. 20 tablet 0   fluticasone (FLONASE) 50 MCG/ACT nasal spray Place 1 spray into both nostrils daily.     hydrOXYzine (ATARAX) 50 MG tablet Take 1 tablet (50 mg total) by mouth 2 (two) times daily. 60 tablet 0   meloxicam (MOBIC) 15 MG tablet  Take 15 mg by mouth daily.     Oxcarbazepine (TRILEPTAL) 300 MG tablet Take 1 tablet (300 mg total) by mouth 2 (two) times daily. 60 tablet 0   QUEtiapine (SEROQUEL) 400 MG tablet Take 1 tablet (400 mg total) by mouth 2 (two) times daily. 60 tablet 0   sertraline (ZOLOFT) 50 MG tablet Take 3 tablets (150 mg total) by mouth in the morning. 90 tablet 0   traZODone (DESYREL) 100 MG  tablet Take 1 tablet (100 mg total) by mouth at bedtime. 30 tablet 0   valACYclovir (VALTREX) 500 MG tablet Take 500 mg by mouth daily.      Labs  Lab Results:  Admission on 07/25/2022  Component Date Value Ref Range Status   SARS Coronavirus 2 by RT PCR 07/25/2022 NEGATIVE  NEGATIVE Final   Comment: (NOTE) SARS-CoV-2 target nucleic acids are NOT DETECTED.  The SARS-CoV-2 RNA is generally detectable in upper respiratory specimens during the acute phase of infection. The lowest concentration of SARS-CoV-2 viral copies this assay can detect is 138 copies/mL. A negative result does not preclude SARS-Cov-2 infection and should not be used as the sole basis for treatment or other patient management decisions. A negative result may occur with  improper specimen collection/handling, submission of specimen other than nasopharyngeal swab, presence of viral mutation(s) within the areas targeted by this assay, and inadequate number of viral copies(<138 copies/mL). A negative result must be combined with clinical observations, patient history, and epidemiological information. The expected result is Negative.  Fact Sheet for Patients:  BloggerCourse.comhttps://www.fda.gov/media/152166/download  Fact Sheet for Healthcare Providers:  SeriousBroker.ithttps://www.fda.gov/media/152162/download  This test is no                          t yet approved or cleared by the [place] FDA and  has been authorized for detection and/or diagnosis of SARS-CoV-2 by FDA under an Emergency Use Authorization (EUA). This EUA will remain  in effect (meaning this test  can be used) for the duration of the COVID-19 declaration under Section 564(b)(1) of the Act, 21 U.S.C.section 360bbb-3(b)(1), unless the authorization is terminated  or revoked sooner.       Influenza A by PCR 07/25/2022 NEGATIVE  NEGATIVE Final   Influenza B by PCR 07/25/2022 NEGATIVE  NEGATIVE Final   Comment: (NOTE) The Xpert Xpress SARS-CoV-2/FLU/RSV plus assay is intended as an aid in the diagnosis of influenza from Nasopharyngeal swab specimens and should not be used as a sole basis for treatment. Nasal washings and aspirates are unacceptable for Xpert Xpress SARS-CoV-2/FLU/RSV testing.  Fact Sheet for Patients: BloggerCourse.comhttps://www.fda.gov/media/152166/download  Fact Sheet for Healthcare Providers: SeriousBroker.ithttps://www.fda.gov/media/152162/download  This test is not yet approved or cleared by the [place] FDA and has been authorized for detection and/or diagnosis of SARS-CoV-2 by FDA under an Emergency Use Authorization (EUA). This EUA will remain in effect (meaning this test can be used) for the duration of the COVID-19 declaration under Section 564(b)(1) of the Act, 21 U.S.C. section 360bbb-3(b)(1), unless the authorization is terminated or revoked.  Performed at [facility], [address] [place], [place] [id number]    WBC 07/25/2022 8.3  4.0 - 10.5 K/uL Final   RBC 07/25/2022 4.44  3.87 - 5.11 MIL/uL Final   Hemoglobin 07/25/2022 13.7  12.0 - 15.0 g/dL Final   HCT 60/45/409810/09/2022 40.2  36.0 - 46.0 % Final   MCV 07/25/2022 90.5  80.0 - 100.0 fL Final   MCH 07/25/2022 30.9  26.0 - 34.0 pg Final   MCHC 07/25/2022 34.1  30.0 - 36.0 g/dL Final   RDW 11/91/478210/09/2022 12.2  11.5 - 15.5 % Final   Platelets 07/25/2022 248  150 - 400 K/uL Final   nRBC 07/25/2022 0.0  0.0 - 0.2 % Final   Neutrophils Relative % 07/25/2022 43  % Final   Neutro Abs 07/25/2022 3.6  1.7 - 7.7 K/uL Final   Lymphocytes Relative 07/25/2022 52  %  Final   Lymphs Abs 07/25/2022 4.2 (H)  0.7 - 4.0 K/uL Final    Monocytes Relative 07/25/2022 5  % Final   Monocytes Absolute 07/25/2022 0.4  0.1 - 1.0 K/uL Final   Eosinophils Relative 07/25/2022 0  % Final   Eosinophils Absolute 07/25/2022 0.0  0.0 - 0.5 K/uL Final   Basophils Relative 07/25/2022 0  % Final   Basophils Absolute 07/25/2022 0.0  0.0 - 0.1 K/uL Final   Immature Granulocytes 07/25/2022 0  % Final   Abs Immature Granulocytes 07/25/2022 0.02  0.00 - 0.07 K/uL Final   Performed at [facility], [address], [place], [place] [address]   Sodium 07/25/2022 138  135 - 145 mmol/L Final   Potassium 07/25/2022 4.0  3.5 - 5.1 mmol/L Final   Chloride 07/25/2022 104  98 - 111 mmol/L Final   CO2 07/25/2022 29  22 - 32 mmol/L Final   Glucose, Bld 07/25/2022 83  70 - 99 mg/dL Final   Glucose reference range applies only to samples taken after fasting for at least 8 hours.   BUN 07/25/2022 11  6 - 20 mg/dL Final   Creatinine, Ser 07/25/2022 0.97  0.44 - 1.00 mg/dL Final   Calcium 60/45/4098 9.2  8.9 - 10.3 mg/dL Final   Total Protein 11/91/4782 7.0  6.5 - 8.1 g/dL Final   Albumin 95/62/1308 3.8  3.5 - 5.0 g/dL Final   AST 65/78/4696 18  15 - 41 U/L Final   ALT 07/25/2022 22  0 - 44 U/L Final   Alkaline Phosphatase 07/25/2022 64  38 - 126 U/L Final   Total Bilirubin 07/25/2022 0.2 (L)  0.3 - 1.2 mg/dL Final   GFR, Estimated 07/25/2022 >60  >60 mL/min Final   Comment: (NOTE) Calculated using the CKD-EPI Creatinine Equation (2021)    Anion gap 07/25/2022 5  5 - 15 Final   Performed at [facility], [address], [place], [place] [address]   POC Amphetamine UR 07/25/2022 None Detected  NONE DETECTED (Cut Off Level 1000 ng/mL) Preliminary   POC Secobarbital (BAR) 07/25/2022 None Detected  NONE DETECTED (Cut Off Level 300 ng/mL) Preliminary   POC Buprenorphine (BUP) 07/25/2022 None Detected  NONE DETECTED (Cut Off Level 10 ng/mL) Preliminary   POC Oxazepam (BZO) 07/25/2022 None Detected  NONE DETECTED (Cut Off Level 300 ng/mL)  Preliminary   POC Cocaine UR 07/25/2022 None Detected  NONE DETECTED (Cut Off Level 300 ng/mL) Preliminary   POC Methamphetamine UR 07/25/2022 None Detected  NONE DETECTED (Cut Off Level 1000 ng/mL) Preliminary   POC Morphine 07/25/2022 None Detected  NONE DETECTED (Cut Off Level 300 ng/mL) Preliminary   POC Methadone UR 07/25/2022 None Detected  NONE DETECTED (Cut Off Level 300 ng/mL) Preliminary   POC Oxycodone UR 07/25/2022 None Detected  NONE DETECTED (Cut Off Level 100 ng/mL) Preliminary   POC Marijuana UR 07/25/2022 None Detected  NONE DETECTED (Cut Off Level 50 ng/mL) Preliminary   SARSCOV2ONAVIRUS 2 AG 07/25/2022 NEGATIVE  NEGATIVE Final   Comment: (NOTE) SARS-CoV-2 antigen NOT DETECTED.   Negative results are presumptive.  Negative results do not preclude SARS-CoV-2 infection and should not be used as the sole basis for treatment or other patient management decisions, including infection  control decisions, particularly in the presence of clinical signs and  symptoms consistent with COVID-19, or in those who have been in contact with the virus.  Negative results must be combined with clinical observations, patient history, and epidemiological  information. The expected result is Negative.  Fact Sheet for Patients: https://www.jennings-kim.com/  Fact Sheet for Healthcare Providers: https://alexander-rogers.biz/  This test is not yet approved or cleared by the [place] FDA and  has been authorized for detection and/or diagnosis of SARS-CoV-2 by FDA under an Emergency Use Authorization (EUA).  This EUA will remain in effect (meaning this test can be used) for the duration of  the COV                          ID-19 declaration under Section 564(b)(1) of the Act, 21 U.S.C. section 360bbb-3(b)(1), unless the authorization is terminated or revoked sooner.     Cholesterol 07/25/2022 181  0 - 200 mg/dL Final   Triglycerides 16/07/9603 118  <150 mg/dL  Final   HDL 54/06/8118 45  >40 mg/dL Final   Total CHOL/HDL Ratio 07/25/2022 4.0  RATIO Final   VLDL 07/25/2022 24  0 - 40 mg/dL Final   LDL Cholesterol 07/25/2022 112 (H)  0 - 99 mg/dL Final   Comment:        Total Cholesterol/HDL:CHD Risk Coronary Heart Disease Risk Table                     Men   Women  1/2 Average Risk   3.4   3.3  Average Risk       5.0   4.4  2 X Average Risk   9.6   7.1  3 X Average Risk  23.4   11.0        Use the calculated Patient Ratio above and the CHD Risk Table to determine the patient's CHD Risk.        ATP III CLASSIFICATION (LDL):  <100     mg/dL   Optimal  147-829  mg/dL   Near or Above                    Optimal  130-159  mg/dL   Borderline  562-130  mg/dL   High  >865     mg/dL   Very High Performed at [facility], [address] [place], [place] [address]    TSH 07/25/2022 6.668 (H)  0.350 - 4.500 uIU/mL Final   Comment: Performed by a 3rd Generation assay with a functional sensitivity of <=0.01 uIU/mL. Performed at [facility], [address], [place], [place] [address]    Glucose-Capillary 07/26/2022 104 (H)  70 - 99 mg/dL Final   Glucose reference range applies only to samples taken after fasting for at least 8 hours.   T3, Free 07/31/2022 2.3  2.0 - 4.4 pg/mL Final   Comment: (NOTE) Performed At: [facility] [address] [place], [place] [id number] Jolene Schimke MD UU:[id number]    Free T4 07/31/2022 0.60 (L)  0.61 - 1.12 ng/dL Final   Comment: (NOTE) Biotin ingestion may interfere with free T4 tests. If the results are inconsistent with the TSH level, previous test results, or the clinical presentation, then consider biotin interference. If needed, order repeat testing after stopping biotin. Performed at [facility], [address], [place], [place] [address]   Admission on 07/04/2022, Discharged on 07/05/2022  Component Date Value Ref Range Status   Sodium 07/04/2022 142  135 - 145 mmol/L  Final   Potassium 07/04/2022 4.4  3.5 - 5.1 mmol/L Final   Chloride 07/04/2022 109  98 - 111 mmol/L Final  CO2 07/04/2022 26  22 - 32 mmol/L Final   Glucose, Bld 07/04/2022 96  70 - 99 mg/dL Final   Glucose reference range applies only to samples taken after fasting for at least 8 hours.   BUN 07/04/2022 15  6 - 20 mg/dL Final   Creatinine, Ser 07/04/2022 0.83  0.44 - 1.00 mg/dL Final   Calcium 21/30/8657 9.3  8.9 - 10.3 mg/dL Final   Total Protein 84/69/6295 7.2  6.5 - 8.1 g/dL Final   Albumin 28/41/3244 3.9  3.5 - 5.0 g/dL Final   AST 10/16/7251 23  15 - 41 U/L Final   ALT 07/04/2022 28  0 - 44 U/L Final   Alkaline Phosphatase 07/04/2022 77  38 - 126 U/L Final   Total Bilirubin 07/04/2022 0.4  0.3 - 1.2 mg/dL Final   GFR, Estimated 07/04/2022 >60  >60 mL/min Final   Comment: (NOTE) Calculated using the CKD-EPI Creatinine Equation (2021)    Anion gap 07/04/2022 7  5 - 15 Final   Performed at [facility], [address] [place], [place] [address]   Alcohol, Ethyl (B) 07/04/2022 <10  <10 mg/dL Final   Comment: (NOTE) Lowest detectable limit for serum alcohol is 10 mg/dL.  For medical purposes only. Performed at [facility], [address] [place], [place] [address]    WBC 07/04/2022 7.0  4.0 - 10.5 K/uL Final   RBC 07/04/2022 4.18  3.87 - 5.11 MIL/uL Final   Hemoglobin 07/04/2022 12.8  12.0 - 15.0 g/dL Final   HCT 59/56/3875 39.1  36.0 - 46.0 % Final   MCV 07/04/2022 93.5  80.0 - 100.0 fL Final   MCH 07/04/2022 30.6  26.0 - 34.0 pg Final   MCHC 07/04/2022 32.7  30.0 - 36.0 g/dL Final   RDW 64/33/2951 12.9  11.5 - 15.5 % Final   Platelets 07/04/2022 243  150 - 400 K/uL Final   nRBC 07/04/2022 0.0  0.0 - 0.2 % Final   Neutrophils Relative % 07/04/2022 43  % Final   Neutro Abs 07/04/2022 3.0  1.7 - 7.7 K/uL Final   Lymphocytes Relative 07/04/2022 50  % Final   Lymphs Abs 07/04/2022 3.5  0.7 - 4.0 K/uL Final   Monocytes Relative  07/04/2022 7  % Final   Monocytes Absolute 07/04/2022 0.5  0.1 - 1.0 K/uL Final   Eosinophils Relative 07/04/2022 0  % Final   Eosinophils Absolute 07/04/2022 0.0  0.0 - 0.5 K/uL Final   Basophils Relative 07/04/2022 0  % Final   Basophils Absolute 07/04/2022 0.0  0.0 - 0.1 K/uL Final   Immature Granulocytes 07/04/2022 0  % Final   Abs Immature Granulocytes 07/04/2022 0.01  0.00 - 0.07 K/uL Final   Performed at [facility], [address], [place], [place] [address]   I-stat hCG, quantitative 07/04/2022 <5.0  <5 mIU/mL Final   Comment 3 07/04/2022          Final   Comment:   GEST. AGE      CONC.  (mIU/mL)   <=1 WEEK        5 - 50     2 WEEKS       50 - 500     3 WEEKS       100 - 10,000     4 WEEKS     1,000 - 30,000        FEMALE AND NON-PREGNANT FEMALE:     LESS  THAN 5 mIU/mL   Admission on 06/14/2022, Discharged on 06/14/2022  Component Date Value Ref Range Status   Color, UA 06/14/2022 yellow  yellow Final   Clarity, UA 06/14/2022 cloudy (A)  clear Final   Glucose, UA 06/14/2022 negative  negative mg/dL Final   Bilirubin, UA 16/07/9603 negative  negative Final   Ketones, POC UA 06/14/2022 negative  negative mg/dL Final   Spec Grav, UA 54/06/8118 1.025  1.010 - 1.025 Final   Blood, UA 06/14/2022 negative  negative Final   pH, UA 06/14/2022 7.5  5.0 - 8.0 Final   Protein Ur, POC 06/14/2022 =30 (A)  negative mg/dL Final   Urobilinogen, UA 06/14/2022 0.2  0.2 or 1.0 E.U./dL Final   Nitrite, UA 14/78/2956 Negative  Negative Final   Leukocytes, UA 06/14/2022 Negative  Negative Final   Preg Test, Ur 06/14/2022 Negative  Negative Final   Specimen Description 06/14/2022 URINE, CLEAN CATCH   Final   Special Requests 06/14/2022    Final                   Value:NONE Performed at [facility], [address], [place], [place] [address]    Culture 06/14/2022 MULTIPLE SPECIES PRESENT, SUGGEST RECOLLECTION (A)   Final   Report Status 06/14/2022 06/16/2022 FINAL    Final  Admission on 06/07/2022, Discharged on 06/10/2022  Component Date Value Ref Range Status   SARS Coronavirus 2 by RT PCR 06/07/2022 NEGATIVE  NEGATIVE Final   Comment: (NOTE) SARS-CoV-2 target nucleic acids are NOT DETECTED.  The SARS-CoV-2 RNA is generally detectable in upper respiratory specimens during the acute phase of infection. The lowest concentration of SARS-CoV-2 viral copies this assay can detect is 138 copies/mL. A negative result does not preclude SARS-Cov-2 infection and should not be used as the sole basis for treatment or other patient management decisions. A negative result may occur with  improper specimen collection/handling, submission of specimen other than nasopharyngeal swab, presence of viral mutation(s) within the areas targeted by this assay, and inadequate number of viral copies(<138 copies/mL). A negative result must be combined with clinical observations, patient history, and epidemiological information. The expected result is Negative.  Fact Sheet for Patients:  BloggerCourse.com  Fact Sheet for Healthcare Providers:  SeriousBroker.it  This test is no                          t yet approved or cleared by the [place] FDA and  has been authorized for detection and/or diagnosis of SARS-CoV-2 by FDA under an Emergency Use Authorization (EUA). This EUA will remain  in effect (meaning this test can be used) for the duration of the COVID-19 declaration under Section 564(b)(1) of the Act, 21 U.S.C.section 360bbb-3(b)(1), unless the authorization is terminated  or revoked sooner.       Influenza A by PCR 06/07/2022 NEGATIVE  NEGATIVE Final   Influenza B by PCR 06/07/2022 NEGATIVE  NEGATIVE Final   Comment: (NOTE) The Xpert Xpress SARS-CoV-2/FLU/RSV plus assay is intended as an aid in the diagnosis of influenza from Nasopharyngeal swab specimens and should not be used as a sole basis for  treatment. Nasal washings and aspirates are unacceptable for Xpert Xpress SARS-CoV-2/FLU/RSV testing.  Fact Sheet for Patients: BloggerCourse.com  Fact Sheet for Healthcare Providers: SeriousBroker.it  This test is not yet approved or cleared by the [place] FDA and has been authorized for detection and/or diagnosis of SARS-CoV-2 by FDA under  an Emergency Use Authorization (EUA). This EUA will remain in effect (meaning this test can be used) for the duration of the COVID-19 declaration under Section 564(b)(1) of the Act, 21 U.S.C. section 360bbb-3(b)(1), unless the authorization is terminated or revoked.  Performed at [facility], [address] [place], [place] [address]    WBC 06/07/2022 5.7  4.0 - 10.5 K/uL Final   RBC 06/07/2022 4.39  3.87 - 5.11 MIL/uL Final   Hemoglobin 06/07/2022 13.2  12.0 - 15.0 g/dL Final   HCT 60/45/4098 40.4  36.0 - 46.0 % Final   MCV 06/07/2022 92.0  80.0 - 100.0 fL Final   MCH 06/07/2022 30.1  26.0 - 34.0 pg Final   MCHC 06/07/2022 32.7  30.0 - 36.0 g/dL Final   RDW 11/91/4782 12.4  11.5 - 15.5 % Final   Platelets 06/07/2022 308  150 - 400 K/uL Final   nRBC 06/07/2022 0.0  0.0 - 0.2 % Final   Neutrophils Relative % 06/07/2022 42  % Final   Neutro Abs 06/07/2022 2.4  1.7 - 7.7 K/uL Final   Lymphocytes Relative 06/07/2022 54  % Final   Lymphs Abs 06/07/2022 3.1  0.7 - 4.0 K/uL Final   Monocytes Relative 06/07/2022 4  % Final   Monocytes Absolute 06/07/2022 0.2  0.1 - 1.0 K/uL Final   Eosinophils Relative 06/07/2022 0  % Final   Eosinophils Absolute 06/07/2022 0.0  0.0 - 0.5 K/uL Final   Basophils Relative 06/07/2022 0  % Final   Basophils Absolute 06/07/2022 0.0  0.0 - 0.1 K/uL Final   Immature Granulocytes 06/07/2022 0  % Final   Abs Immature Granulocytes 06/07/2022 0.01  0.00 - 0.07 K/uL Final   Performed at [facility], [address] [place], [place] [address]   Sodium  06/07/2022 139  135 - 145 mmol/L Final   Potassium 06/07/2022 4.0  3.5 - 5.1 mmol/L Final   Chloride 06/07/2022 104  98 - 111 mmol/L Final   CO2 06/07/2022 28  22 - 32 mmol/L Final   Glucose, Bld 06/07/2022 104 (H)  70 - 99 mg/dL Final   Glucose reference range applies only to samples taken after fasting for at least 8 hours.   BUN 06/07/2022 8  6 - 20 mg/dL Final   Creatinine, Ser 06/07/2022 0.84  0.44 - 1.00 mg/dL Final   Calcium 30/86/5784 9.1  8.9 - 10.3 mg/dL Final   Total Protein 69/62/9528 6.9  6.5 - 8.1 g/dL Final   Albumin 41/32/4401 3.7  3.5 - 5.0 g/dL Final   AST 02/72/5366 19  15 - 41 U/L Final   ALT 06/07/2022 22  0 - 44 U/L Final   Alkaline Phosphatase 06/07/2022 54  38 - 126 U/L Final   Total Bilirubin 06/07/2022 0.4  0.3 - 1.2 mg/dL Final   GFR, Estimated 06/07/2022 >60  >60 mL/min Final   Comment: (NOTE) Calculated using the CKD-EPI Creatinine Equation (2021)    Anion gap 06/07/2022 7  5 - 15 Final   Performed at [facility], [address], [place], [place] [address]   Hgb A1c MFr Bld 06/07/2022 5.0  4.8 - 5.6 % Final   Comment: (NOTE) Pre diabetes:          5.7%-6.4%  Diabetes:              >6.4%  Glycemic control for   <7.0% adults with diabetes    Mean Plasma Glucose 06/07/2022 96.8  mg/dL Final   Performed at [facility], [address] [place], [place] [address]   TSH 06/07/2022 1.620  0.350 - 4.500 uIU/mL Final   Comment: Performed by a 3rd Generation assay with a functional sensitivity of <=0.01 uIU/mL. Performed at [facility], [address], [place], [place] [address]    RPR Ser Ql 06/07/2022 NON REACTIVE  NON REACTIVE Final   Performed at [facility], [address] [place], [place] [address]   Color, Urine 06/07/2022 YELLOW  YELLOW Final   APPearance 06/07/2022 HAZY (A)  CLEAR Final   Specific Gravity, Urine 06/07/2022 1.018  1.005 - 1.030 Final   pH 06/07/2022 7.0  5.0 - 8.0 Final   Glucose, UA 06/07/2022 NEGATIVE   NEGATIVE mg/dL Final   Hgb urine dipstick 06/07/2022 NEGATIVE  NEGATIVE Final   Bilirubin Urine 06/07/2022 NEGATIVE  NEGATIVE Final   Ketones, ur 06/07/2022 NEGATIVE  NEGATIVE mg/dL Final   Protein, ur 38/75/6433 NEGATIVE  NEGATIVE mg/dL Final   Nitrite 29/51/8841 NEGATIVE  NEGATIVE Final   Leukocytes,Ua 06/07/2022 NEGATIVE  NEGATIVE Final   Performed at [facility], [address] [place], [place] [address]   Cholesterol 06/07/2022 164  0 - 200 mg/dL Final   Triglycerides 01/60/1093 158 (H)  <150 mg/dL Final   HDL 23/55/7322 44  >40 mg/dL Final   Total CHOL/HDL Ratio 06/07/2022 3.7  RATIO Final   VLDL 06/07/2022 32  0 - 40 mg/dL Final   LDL Cholesterol 06/07/2022 88  0 - 99 mg/dL Final   Comment:        Total Cholesterol/HDL:CHD Risk Coronary Heart Disease Risk Table                     Men   Women  1/2 Average Risk   3.4   3.3  Average Risk       5.0   4.4  2 X Average Risk   9.6   7.1  3 X Average Risk  23.4   11.0        Use the calculated Patient Ratio above and the CHD Risk Table to determine the patient's CHD Risk.        ATP III CLASSIFICATION (LDL):  <100     mg/dL   Optimal  025-427  mg/dL   Near or Above                    Optimal  130-159  mg/dL   Borderline  062-376  mg/dL   High  >283     mg/dL   Very High Performed at [facility], [address], [place], [place] [address]    HIV Screen 4th Generation wRfx 06/07/2022 Non Reactive  Non Reactive Final   Performed at [facility], [address], [place], [place] [address]   SARSCOV2ONAVIRUS 2 AG 06/07/2022 NEGATIVE  NEGATIVE Final   Comment: (NOTE) SARS-CoV-2 antigen NOT DETECTED.   Negative results are presumptive.  Negative results do not preclude SARS-CoV-2 infection and should not be used as the sole basis for treatment or other patient management decisions, including infection  control decisions, particularly in the presence of clinical signs and  symptoms consistent with COVID-19,  or in those who have been in contact with the virus.  Negative results must be combined with clinical observations, patient history, and epidemiological information. The expected result is Negative.  Fact Sheet for Patients: https://www.jennings-kim.com/  Fact Sheet for Healthcare Providers: https://alexander-rogers.biz/  This test is not yet approved or cleared by the [place] FDA and  has been authorized for detection and/or diagnosis of SARS-CoV-2 by FDA under an Emergency Use Authorization (EUA).  This EUA will remain in effect (meaning this test can be used) for the duration of  the COV                          ID-19 declaration under Section 564(b)(1) of the Act, 21 U.S.C. section 360bbb-3(b)(1), unless the authorization is terminated or revoked sooner.     POC Amphetamine UR 06/07/2022 None Detected  NONE DETECTED (Cut Off Level 1000 ng/mL) Final   POC Secobarbital (BAR) 06/07/2022 None Detected  NONE DETECTED (Cut Off Level 300 ng/mL) Final   POC Buprenorphine (BUP) 06/07/2022 None Detected  NONE DETECTED (Cut Off Level 10 ng/mL) Final   POC Oxazepam (BZO) 06/07/2022 None Detected  NONE DETECTED (Cut Off Level 300 ng/mL) Final   POC Cocaine UR 06/07/2022 None Detected  NONE DETECTED (Cut Off Level 300 ng/mL) Final   POC Methamphetamine UR 06/07/2022 None Detected  NONE DETECTED (Cut Off Level 1000 ng/mL) Final   POC Morphine 06/07/2022 None Detected  NONE DETECTED (Cut Off Level 300 ng/mL) Final   POC Methadone UR 06/07/2022 None Detected  NONE DETECTED (Cut Off Level 300 ng/mL) Final   POC Oxycodone UR 06/07/2022 None Detected  NONE DETECTED (Cut Off Level 100 ng/mL) Final   POC Marijuana UR 06/07/2022 None Detected  NONE DETECTED (Cut Off Level 50 ng/mL) Final   Preg Test, Ur 06/08/2022 NEGATIVE  NEGATIVE Final   Comment:        THE SENSITIVITY OF THIS METHODOLOGY IS >20 mIU/mL. Performed at [facility], [address], [place],  [place] [address]    Preg Test, Ur 06/08/2022 NEGATIVE  NEGATIVE Final   Comment:        THE SENSITIVITY OF THIS METHODOLOGY IS >24 mIU/mL   Admission on 02/07/2022, Discharged on 02/08/2022  Component Date Value Ref Range Status   SARS Coronavirus 2 by RT PCR 02/07/2022 NEGATIVE  NEGATIVE Final   Comment: (NOTE) SARS-CoV-2 target nucleic acids are NOT DETECTED.  The SARS-CoV-2 RNA is generally detectable in upper respiratory specimens during the acute phase of infection. The lowest concentration of SARS-CoV-2 viral copies this assay can detect is 138 copies/mL. A negative result does not preclude SARS-Cov-2 infection and should not be used as the sole basis for treatment or other patient management decisions. A negative result may occur with  improper specimen collection/handling, submission of specimen other than nasopharyngeal swab, presence of viral mutation(s) within the areas targeted by this assay, and inadequate number of viral copies(<138 copies/mL). A negative result must be combined with clinical observations, patient history, and epidemiological information. The expected result is Negative.  Fact Sheet for Patients:  BloggerCourse.com  Fact Sheet for Healthcare Providers:  SeriousBroker.it  This test is no                          t yet approved or cleared by the [place] FDA and  has been authorized for detection and/or diagnosis of SARS-CoV-2 by FDA under an Emergency Use Authorization (EUA). This EUA will remain  in effect (meaning this test can be used) for the duration of the COVID-19 declaration under Section 564(b)(1) of the Act, 21 U.S.C.section 360bbb-3(b)(1), unless the authorization  is terminated  or revoked sooner.       Influenza A by PCR 02/07/2022 NEGATIVE  NEGATIVE Final   Influenza B by PCR 02/07/2022 NEGATIVE  NEGATIVE Final   Comment: (NOTE) The Xpert Xpress SARS-CoV-2/FLU/RSV plus assay is  intended as an aid in the diagnosis of influenza from Nasopharyngeal swab specimens and should not be used as a sole basis for treatment. Nasal washings and aspirates are unacceptable for Xpert Xpress SARS-CoV-2/FLU/RSV testing.  Fact Sheet for Patients: BloggerCourse.com  Fact Sheet for Healthcare Providers: SeriousBroker.it  This test is not yet approved or cleared by the [place] FDA and has been authorized for detection and/or diagnosis of SARS-CoV-2 by FDA under an Emergency Use Authorization (EUA). This EUA will remain in effect (meaning this test can be used) for the duration of the COVID-19 declaration under Section 564(b)(1) of the Act, 21 U.S.C. section 360bbb-3(b)(1), unless the authorization is terminated or revoked.  Performed at [facility], [address], [place], [place] [address]    WBC 02/07/2022 8.1  4.0 - 10.5 K/uL Final   RBC 02/07/2022 4.55  3.87 - 5.11 MIL/uL Final   Hemoglobin 02/07/2022 13.6  12.0 - 15.0 g/dL Final   HCT 50/06/3817 41.9  36.0 - 46.0 % Final   MCV 02/07/2022 92.1  80.0 - 100.0 fL Final   MCH 02/07/2022 29.9  26.0 - 34.0 pg Final   MCHC 02/07/2022 32.5  30.0 - 36.0 g/dL Final   RDW 29/93/7169 12.9  11.5 - 15.5 % Final   Platelets 02/07/2022 286  150 - 400 K/uL Final   nRBC 02/07/2022 0.0  0.0 - 0.2 % Final   Neutrophils Relative % 02/07/2022 41  % Final   Neutro Abs 02/07/2022 3.3  1.7 - 7.7 K/uL Final   Lymphocytes Relative 02/07/2022 53  % Final   Lymphs Abs 02/07/2022 4.3 (H)  0.7 - 4.0 K/uL Final   Monocytes Relative 02/07/2022 6  % Final   Monocytes Absolute 02/07/2022 0.5  0.1 - 1.0 K/uL Final   Eosinophils Relative 02/07/2022 0  % Final   Eosinophils Absolute 02/07/2022 0.0  0.0 - 0.5 K/uL Final   Basophils Relative 02/07/2022 0  % Final   Basophils Absolute 02/07/2022 0.0  0.0 - 0.1 K/uL Final   Immature Granulocytes 02/07/2022 0  % Final   Abs Immature  Granulocytes 02/07/2022 0.02  0.00 - 0.07 K/uL Final   Performed at [facility], [address] [place], [place] [address]   Sodium 02/07/2022 137  135 - 145 mmol/L Final   Potassium 02/07/2022 4.1  3.5 - 5.1 mmol/L Final   Chloride 02/07/2022 104  98 - 111 mmol/L Final   CO2 02/07/2022 29  22 - 32 mmol/L Final   Glucose, Bld 02/07/2022 92  70 - 99 mg/dL Final   Glucose reference range applies only to samples taken after fasting for at least 8 hours.   BUN 02/07/2022 10  6 - 20 mg/dL Final   Creatinine, Ser 02/07/2022 0.88  0.44 - 1.00 mg/dL Final   Calcium 81/10/7508 9.4  8.9 - 10.3 mg/dL Final   Total Protein 25/85/2778 7.2  6.5 - 8.1 g/dL Final   Albumin 24/23/5361 3.9  3.5 - 5.0 g/dL Final   AST 44/31/5400 18  15 - 41 U/L Final   ALT 02/07/2022 25  0 - 44 U/L Final   Alkaline Phosphatase 02/07/2022 66  38 - 126 U/L Final   Total Bilirubin 02/07/2022 0.2 (  L)  0.3 - 1.2 mg/dL Final   GFR, Estimated 02/07/2022 >60  >60 mL/min Final   Comment: (NOTE) Calculated using the CKD-EPI Creatinine Equation (2021)    Anion gap 02/07/2022 4 (L)  5 - 15 Final   Performed at [facility], [address], [place], [place] [address]   Hgb A1c MFr Bld 02/07/2022 5.0  4.8 - 5.6 % Final   Comment: (NOTE) Pre diabetes:          5.7%-6.4%  Diabetes:              >6.4%  Glycemic control for   <7.0% adults with diabetes    Mean Plasma Glucose 02/07/2022 96.8  mg/dL Final   Performed at [facility], [address], [place], [place] [address]   Alcohol, Ethyl (B) 02/07/2022 <10  <10 mg/dL Final   Comment: (NOTE) Lowest detectable limit for serum alcohol is 10 mg/dL.  For medical purposes only. Performed at [facility], [address] [place], [place] [address]    Cholesterol 02/07/2022 192  0 - 200 mg/dL Final   Triglycerides 91/47/8295 192 (H)  <150 mg/dL Final   HDL 62/13/0865 46  >40 mg/dL Final   Total CHOL/HDL Ratio 02/07/2022 4.2  RATIO Final   VLDL  02/07/2022 38  0 - 40 mg/dL Final   LDL Cholesterol 02/07/2022 108 (H)  0 - 99 mg/dL Final   Comment:        Total Cholesterol/HDL:CHD Risk Coronary Heart Disease Risk Table                     Men   Women  1/2 Average Risk   3.4   3.3  Average Risk       5.0   4.4  2 X Average Risk   9.6   7.1  3 X Average Risk  23.4   11.0        Use the calculated Patient Ratio above and the CHD Risk Table to determine the patient's CHD Risk.        ATP III CLASSIFICATION (LDL):  <100     mg/dL   Optimal  784-696  mg/dL   Near or Above                    Optimal  130-159  mg/dL   Borderline  295-284  mg/dL   High  >132     mg/dL   Very High Performed at [facility], [address], [place], [place] [address]    TSH 02/07/2022 6.344 (H)  0.350 - 4.500 uIU/mL Final   Comment: Performed by a 3rd Generation assay with a functional sensitivity of <=0.01 uIU/mL. Performed at [facility], [address] [place], [place] [address]    POC Amphetamine UR 02/07/2022 None Detected  NONE DETECTED (Cut Off Level 1000 ng/mL) Final   POC Secobarbital (BAR) 02/07/2022 None Detected  NONE DETECTED (Cut Off Level 300 ng/mL) Final   POC Buprenorphine (BUP) 02/07/2022 None Detected  NONE DETECTED (Cut Off Level 10 ng/mL) Final   POC Oxazepam (BZO) 02/07/2022 None Detected  NONE DETECTED (Cut Off Level 300 ng/mL) Final   POC Cocaine UR 02/07/2022 None Detected  NONE DETECTED (Cut Off Level 300 ng/mL) Final   POC Methamphetamine UR 02/07/2022 None Detected  NONE DETECTED (Cut Off Level 1000 ng/mL) Final   POC Morphine 02/07/2022 None Detected  NONE DETECTED (Cut  Off Level 300 ng/mL) Final   POC Oxycodone UR 02/07/2022 None Detected  NONE DETECTED (Cut Off Level 100 ng/mL) Final   POC Methadone UR 02/07/2022 None Detected  NONE DETECTED (Cut Off Level 300 ng/mL) Final   POC Marijuana UR 02/07/2022 None Detected  NONE DETECTED (Cut Off Level 50 ng/mL) Final   SARSCOV2ONAVIRUS 2 AG 02/07/2022 NEGATIVE   NEGATIVE Final   Comment: (NOTE) SARS-CoV-2 antigen NOT DETECTED.   Negative results are presumptive.  Negative results do not preclude SARS-CoV-2 infection and should not be used as the sole basis for treatment or other patient management decisions, including infection  control decisions, particularly in the presence of clinical signs and  symptoms consistent with COVID-19, or in those who have been in contact with the virus.  Negative results must be combined with clinical observations, patient history, and epidemiological information. The expected result is Negative.  Fact Sheet for Patients: https://www.jennings-kim.com/  Fact Sheet for Healthcare Providers: https://alexander-rogers.biz/  This test is not yet approved or cleared by the [place] FDA and  has been authorized for detection and/or diagnosis of SARS-CoV-2 by FDA under an Emergency Use Authorization (EUA).  This EUA will remain in effect (meaning this test can be used) for the duration of  the COV                          ID-19 declaration under Section 564(b)(1) of the Act, 21 U.S.C. section 360bbb-3(b)(1), unless the authorization is terminated or revoked sooner.     Preg Test, Ur 02/07/2022 NEGATIVE  NEGATIVE Final   Comment:        THE SENSITIVITY OF THIS METHODOLOGY IS >24 mIU/mL     Blood Alcohol level:  Lab Results  Component Value Date   ETH <10 07/04/2022   ETH <10 02/07/2022    Metabolic Disorder Labs: Lab Results  Component Value Date   HGBA1C 5.0 06/07/2022   MPG 96.8 06/07/2022   MPG 96.8 02/07/2022   No results found for: "PROLACTIN" Lab Results  Component Value Date   CHOL 181 07/25/2022   TRIG 118 07/25/2022   HDL 45 07/25/2022   CHOLHDL 4.0 07/25/2022   VLDL 24 07/25/2022   LDLCALC 112 (H) 07/25/2022   LDLCALC 88 06/07/2022    Therapeutic Lab Levels: No results found for: "LITHIUM" No results found for: "VALPROATE" No results found for:  "CBMZ"  Physical Findings   GAD-7    Flowsheet Row [facility]17/20[facility]  Total GAD-7 Score 17      PHQ2-9    Flowsheet Row ED from 06/07/2022 in [facility] Office Visit from 12/17/2021 in [facility]  PHQ-2 Total Score 1 2  PHQ-9 Total Score 2 8      Flowsheet Row ED from 07/25/2022 in [facility] ED from 07/04/2022 in [facility] from 06/14/2022 in [facility]   C-SSRS RISK CATEGORY High Risk No Risk No Risk        Musculoskeletal  Strength & Muscle Tone: within normal limits Gait & Station: normal Patient leans: N/A  Psychiatric Specialty Exam  Presentation  General Appearance:  Appropriate for Environment  Eye Contact: Good  Speech: Clear and Coherent; Normal Rate  Speech Volume: Normal  Handedness: Right   Mood and Affect  Mood: Euthymic  Affect: Appropriate; Congruent  Thought Process  Thought Processes: Coherent  Descriptions of Associations:Intact  Orientation:Full (Time, Place and Person)  Thought Content:Logical  Diagnosis of Schizophrenia or Schizoaffective disorder in past: No    Hallucinations:Hallucinations: None  Ideas of Reference:None  Suicidal Thoughts:Suicidal Thoughts: No  Homicidal Thoughts:Homicidal Thoughts: No   Sensorium  Memory: Immediate Good; Recent Good  Judgment: Fair  Insight: Fair   Art therapist  Concentration: Good  Attention Span: Good  Recall: Good  Fund of Knowledge: Good  Language: Good   Psychomotor Activity  Psychomotor Activity: Psychomotor Activity: Normal   Assets  Assets: Communication Skills; Desire for Improvement   Sleep  Sleep: Sleep: Good   Nutritional Assessment (For OBS and [facility]BC admissions only) Has the patient had a weight loss or gain of 10 pounds or  more in the last 3 months?: No Has the patient had a decrease in food intake/or appetite?: No Does the patient have dental problems?: No Does the patient have eating habits or behaviors that may be indicators of an eating disorder including binging or inducing vomiting?: No Has the patient recently lost weight without trying?: 0 Has the patient been eating poorly because of a decreased appetite?: 0 Malnutrition Screening Tool Score: 0    Physical Exam  Physical Exam HENT:     Head: Normocephalic.     Nose: Nose normal.  Eyes:     Extraocular Movements: Extraocular movements intact.     Pupils: Pupils are equal, round, and reactive to light.  Cardiovascular:     Rate and Rhythm: Normal rate.  Pulmonary:     Effort: Pulmonary effort is normal.  Musculoskeletal:     Cervical back: Normal range of motion.  Skin:    General: Skin is warm.     Capillary Refill: Capillary refill takes less than 2 seconds.  Neurological:     General: No focal deficit present.     Mental Status: She is alert.    Review of Systems  Psychiatric/Behavioral: Negative.     Blood pressure 110/67, pulse 90, temperature 98.7 F (37.1 C), temperature source Oral, resp. rate 18, SpO2 100 %. There is no height or weight on file to calculate BMI.  Treatment Plan Summary: Patient is boarder. She is stable.CSW actively, seeking permanent placement.  Joaquin Courts, FNP 08/04/2022 9:13 AM

## 2022-08-04 NOTE — ED Notes (Signed)
Pt sitting on her bed talking with pt that is sitting on bed next to her. They are talking and watching television. Calm and cooperative.  No c/o pain or distress. Will continue to monitor for safety

## 2022-08-04 NOTE — ED Notes (Signed)
Patient resting quietly in bed with eyes closed. Respirations equal and unlabored, skin warm and dry, NAD. Routine safety checks conducted according to facility protocol. Will continue to monitor for safety.  

## 2022-08-04 NOTE — ED Notes (Signed)
Patient resting quietly watching TV. Respirations equal and unlabored, skin warm and dry, NAD. Routine safety checks conducted according to facility protocol. Will continue to monitor for safety.

## 2022-08-04 NOTE — ED Notes (Signed)
Patient resting quietly in bed watching TV and eating a snack. Respirations equal and unlabored, skin warm and dry, NAD. Routine safety checks conducted according to facility protocol. Will continue to monitor for safety.

## 2022-08-04 NOTE — ED Notes (Signed)
Pt sleeping at present, no distress noted.  Monitoring for safety. 

## 2022-08-04 NOTE — ED Notes (Signed)
Patient resting quietly in bed watching TV.  Respirations equal and unlabored, skin warm and dry, NAD. Routine safety checks conducted according to facility protocol. Will continue to monitor for safety.   

## 2022-08-05 NOTE — ED Notes (Signed)
Pt sleeping at present, no distress noted.  Monitoring for safety. 

## 2022-08-05 NOTE — Progress Notes (Signed)
[facility] called Nicole [name], the client's father.  The client's father reports he reached out to [place] worker, Nicole [name], but he has not heard back.  Nicole [name]#:[phone]  [place] followed up with the client's dad and he reports he sent Nicole [name] the court guardianship paperwork.  Nicole [name] reports he sent Dr. Karena Addison a copy of the document his wife signed off on giving Nicole [name] authority to "handle all arrangements for [employer]  Writer asked Nicole [name] to call the front either before or after he sends the documents tomorrow.    Nicole [name]: [phone].  Nicole [name]: [email or web address]  Nicole [name] reports no other new updates regarding finding placement.  Writer initially had Nicole [name] address wrong.  Writer resent the group home list to the [name] listed above.    Nicole [name] reports tomorrow he will fax back the signed copy of the Release of Information that was sent to him last Thursday.  A copy of this document will be in the drawer in the social work office.    Nicole [name] [facility]

## 2022-08-05 NOTE — ED Notes (Addendum)
Pt sleeping at present, no distress noted.  Monitoring for safety. 

## 2022-08-05 NOTE — Care Management (Signed)
Care Management   Writer was unsuccessful in reaching the patient parents (legal guardians) Nicole [name] [phone] regarding the status of placement in a Group Home facility.  The legal guardians voicemail is not set up to leave a message.

## 2022-08-05 NOTE — ED Provider Notes (Signed)
Behavioral Health Progress Note  Date and Time: 08/05/2022 10:42 AM Name: Nicole [name] MRN:  [id number]   HPI: Nicole [name] is a 31 y.o. female, with PMH of SCZ unspecified, MDD with psychosis, who presented voluntary to [facility] (07/25/2022) via [facility] after a verbal altercation with Nicole [name] (not patient's legal guardian) for transient SI.  This is patient's fourth visit to [facility] for similar concerns this year. Patient has been dismissed from previous group home due to threatening SI and HI, then leaving the group home.   Legal Guardian: Mom (Nicole [name]) Point of contact: Dad Nicole [name]) APS is following.   Subjective:  Patient was initially seen laying in bed awake, comfortable, no acute distress.  Patient inquired on why finding housing and his taking so long, stated that there were multiple steps involved.  She denied any somatic side effects.  Otherwise she has no other questions or concerns.  YQ:[id number] Thoughts: No (Contracted to safety.  Last SI or HI was on admission.) SI Active Intent and/or Plan:  (Denied) DG:[id number] Thoughts: No HI Active Intent and/or Plan:  (Denied) HI Passive Intent and/or Plan:  (Denied) [id number]: None Description of Auditory Hallucinations: Denied Description of Visual Hallucinations: Denied  Mood: Euthymic Sleep:Good (No nightmares) Appetite: Good  Review of Systems  Respiratory:  Negative for shortness of breath.   Cardiovascular:  Negative for chest pain.  Gastrointestinal:  Negative for nausea and vomiting.  Neurological:  Negative for dizziness and headaches.    Diagnosis:  Final diagnoses:  Severe episode of recurrent major depressive disorder, with psychotic features (HCC)    Total Time spent with patient: 20 minutes  Past Psychiatric History: schizophrenia, schizoaffective disorder, MDD, suicidal ideation Past Medical History:  Past Medical History:  Diagnosis Date   Anxiety    Depression     Past Surgical  History:  Procedure Laterality Date   WISDOM TOOTH EXTRACTION Bilateral 2020   Family History:  Family History  Problem Relation Age of Onset   Hypertension Father    Diabetes Father    Family Psychiatric  History: none reported Social History:  Social History   Substance and Sexual Activity  Alcohol Use Never     Social History   Substance and Sexual Activity  Drug Use Never    Social History   Socioeconomic History   Marital status: Single    Spouse name: Not on file   Number of children: Not on file   Years of education: Not on file   Highest education level: Not on file  Occupational History   Not on file  Tobacco Use   Smoking status: Every Day    Types: Cigarettes   Smokeless tobacco: Never  Vaping Use   Vaping Use: Some days  Substance and Sexual Activity   Alcohol use: Never   Drug use: Never   Sexual activity: Yes    Partners: Female    Birth control/protection: Implant  Other Topics Concern   Not on file  Social History Narrative   Not on file   Social Determinants of Health   Financial Resource Strain: Not on file  Food Insecurity: Not on file  Transportation Needs: Not on file  Physical Activity: Not on file  Stress: Not on file  Social Connections: Not on file   SDOH:  SDOH Screenings   Depression (PHQ2-9): Low Risk  (06/10/2022)  Tobacco Use: High Risk (08/03/2022)   Additional Social History:    Pain Medications: SEE MAR Prescriptions: SEE MAR Over the Counter:  SEE MAR History of alcohol / drug use?: Yes Longest period of sobriety (when/how long): N/A Negative Consequences of Use:  (NONE) Withdrawal Symptoms: None Name of Substance 1: ETOH IN PAST--PT CURRENTLY DENIES 1 - Frequency: SOCIAL USE ETOH IN PAST 1 - Method of Aquiring: LEGAL 1- Route of Use: ORAL DRINK Name of Substance 2: NICOTINE-VAPE AND CIGARETTES 2 - Amount (size/oz): LESS THAN ONE PACK 2 - Frequency: DAILY 2 - Method of Aquiring: LEGAL 2 - Route of  Substance Use: ORAL SMOKE                Current Medications:  Current Facility-Administered Medications  Medication Dose Route Frequency Provider Last Rate Last Admin   acetaminophen (TYLENOL) tablet 650 mg  650 mg Oral Q6H PRN Onuoha, Chinwendu V, NP   650 mg at 07/31/22 1328   alum & mag hydroxide-simeth (MAALOX/MYLANTA) 200-200-20 MG/5ML suspension 30 mL  30 mL Oral Q4H PRN Onuoha, Chinwendu V, NP   30 mL at 07/27/22 2151   ARIPiprazole ER (ABILIFY MAINTENA) injection 400 mg  400 mg Intramuscular Q28 days Princess Bruins, DO   400 mg at 08/02/22 1154   clotrimazole (LOTRIMIN) 1 % cream   Topical BID Bobbitt, Shalon E, NP   1 Application at 08/05/22 0941   hydrOXYzine (ATARAX) tablet 25 mg  25 mg Oral TID PRN Onuoha, Chinwendu V, NP   25 mg at 08/04/22 2142   levothyroxine (SYNTHROID) tablet 100 mcg  100 mcg Oral Q0600 Princess Bruins, DO   100 mcg at 08/05/22 0551   magnesium hydroxide (MILK OF MAGNESIA) suspension 30 mL  30 mL Oral Daily PRN Onuoha, Chinwendu V, NP   30 mL at 08/02/22 1956   nicotine (NICODERM CQ - dosed in mg/24 hours) patch 14 mg  14 mg Transdermal Q0600 Onuoha, Chinwendu V, NP   14 mg at 08/05/22 0932   ondansetron (ZOFRAN) tablet 4 mg  4 mg Oral Once Nwoko, Uchenna E, PA       Oxcarbazepine (TRILEPTAL) tablet 300 mg  300 mg Oral BID Onuoha, Chinwendu V, NP   300 mg at 08/05/22 0939   pantoprazole (PROTONIX) EC tablet 20 mg  20 mg Oral Daily Princess Bruins, DO   20 mg at 08/05/22 0933   QUEtiapine (SEROQUEL) tablet 400 mg  400 mg Oral BID Onuoha, Chinwendu V, NP   400 mg at 08/05/22 0933   senna-docusate (Senokot-S) tablet 1 tablet  1 tablet Oral BID Princess Bruins, DO   1 tablet at 08/05/22 0933   sertraline (ZOLOFT) tablet 150 mg  150 mg Oral Daily Onuoha, Chinwendu V, NP   150 mg at 08/05/22 0933   traZODone (DESYREL) tablet 50 mg  50 mg Oral QHS PRN Onuoha, Chinwendu V, NP   50 mg at 08/04/22 2142   valACYclovir (VALTREX) tablet 500 mg  500 mg Oral Daily Princess Bruins, DO   500 mg at 08/05/22 1610   Current Outpatient Medications  Medication Sig Dispense Refill   ABILIFY MAINTENA 400 MG PRSY prefilled syringe 400 mg every 28 (twenty-eight) days.     cetirizine (ZYRTEC) 10 MG tablet Take 10 mg by mouth daily.     cyclobenzaprine (FLEXERIL) 10 MG tablet Take 1 tablet (10 mg total) by mouth 2 (two) times daily as needed for muscle spasms. 20 tablet 0   fluticasone (FLONASE) 50 MCG/ACT nasal spray Place 1 spray into both nostrils daily.     hydrOXYzine (ATARAX) 50 MG tablet Take 1 tablet (50 mg  total) by mouth 2 (two) times daily. 60 tablet 0   meloxicam (MOBIC) 15 MG tablet Take 15 mg by mouth daily.     Oxcarbazepine (TRILEPTAL) 300 MG tablet Take 1 tablet (300 mg total) by mouth 2 (two) times daily. 60 tablet 0   QUEtiapine (SEROQUEL) 400 MG tablet Take 1 tablet (400 mg total) by mouth 2 (two) times daily. 60 tablet 0   sertraline (ZOLOFT) 50 MG tablet Take 3 tablets (150 mg total) by mouth in the morning. 90 tablet 0   traZODone (DESYREL) 100 MG tablet Take 1 tablet (100 mg total) by mouth at bedtime. 30 tablet 0   valACYclovir (VALTREX) 500 MG tablet Take 500 mg by mouth daily.      Labs  Lab Results:  Admission on 07/25/2022  Component Date Value Ref Range Status   SARS Coronavirus 2 by RT PCR 07/25/2022 NEGATIVE  NEGATIVE Final   Comment: (NOTE) SARS-CoV-2 target nucleic acids are NOT DETECTED.  The SARS-CoV-2 RNA is generally detectable in upper respiratory specimens during the acute phase of infection. The lowest concentration of SARS-CoV-2 viral copies this assay can detect is 138 copies/mL. A negative result does not preclude SARS-Cov-2 infection and should not be used as the sole basis for treatment or other patient management decisions. A negative result may occur with  improper specimen collection/handling, submission of specimen other than nasopharyngeal swab, presence of viral mutation(s) within the areas targeted by this assay,  and inadequate number of viral copies(<138 copies/mL). A negative result must be combined with clinical observations, patient history, and epidemiological information. The expected result is Negative.  Fact Sheet for Patients:  BloggerCourse.com  Fact Sheet for Healthcare Providers:  SeriousBroker.it  This test is no                          t yet approved or cleared by the [place] FDA and  has been authorized for detection and/or diagnosis of SARS-CoV-2 by FDA under an Emergency Use Authorization (EUA). This EUA will remain  in effect (meaning this test can be used) for the duration of the COVID-19 declaration under Section 564(b)(1) of the Act, 21 U.S.C.section 360bbb-3(b)(1), unless the authorization is terminated  or revoked sooner.       Influenza A by PCR 07/25/2022 NEGATIVE  NEGATIVE Final   Influenza B by PCR 07/25/2022 NEGATIVE  NEGATIVE Final   Comment: (NOTE) The Xpert Xpress SARS-CoV-2/FLU/RSV plus assay is intended as an aid in the diagnosis of influenza from Nasopharyngeal swab specimens and should not be used as a sole basis for treatment. Nasal washings and aspirates are unacceptable for Xpert Xpress SARS-CoV-2/FLU/RSV testing.  Fact Sheet for Patients: BloggerCourse.com  Fact Sheet for Healthcare Providers: SeriousBroker.it  This test is not yet approved or cleared by the [place] FDA and has been authorized for detection and/or diagnosis of SARS-CoV-2 by FDA under an Emergency Use Authorization (EUA). This EUA will remain in effect (meaning this test can be used) for the duration of the COVID-19 declaration under Section 564(b)(1) of the Act, 21 U.S.C. section 360bbb-3(b)(1), unless the authorization is terminated or revoked.  Performed at [facility], [address] [place], [place] [address]    WBC 07/25/2022 8.3  4.0 - 10.5 K/uL  Final   RBC 07/25/2022 4.44  3.87 - 5.11 MIL/uL Final   Hemoglobin 07/25/2022 13.7  12.0 - 15.0 g/dL Final   HCT 81/82/9937 40.2  36.0 - 46.0 %  Final   MCV 07/25/2022 90.5  80.0 - 100.0 fL Final   MCH 07/25/2022 30.9  26.0 - 34.0 pg Final   MCHC 07/25/2022 34.1  30.0 - 36.0 g/dL Final   RDW 81/19/1478 12.2  11.5 - 15.5 % Final   Platelets 07/25/2022 248  150 - 400 K/uL Final   nRBC 07/25/2022 0.0  0.0 - 0.2 % Final   Neutrophils Relative % 07/25/2022 43  % Final   Neutro Abs 07/25/2022 3.6  1.7 - 7.7 K/uL Final   Lymphocytes Relative 07/25/2022 52  % Final   Lymphs Abs 07/25/2022 4.2 (H)  0.7 - 4.0 K/uL Final   Monocytes Relative 07/25/2022 5  % Final   Monocytes Absolute 07/25/2022 0.4  0.1 - 1.0 K/uL Final   Eosinophils Relative 07/25/2022 0  % Final   Eosinophils Absolute 07/25/2022 0.0  0.0 - 0.5 K/uL Final   Basophils Relative 07/25/2022 0  % Final   Basophils Absolute 07/25/2022 0.0  0.0 - 0.1 K/uL Final   Immature Granulocytes 07/25/2022 0  % Final   Abs Immature Granulocytes 07/25/2022 0.02  0.00 - 0.07 K/uL Final   Performed at [facility], [address] [place], [place] [address]   Sodium 07/25/2022 138  135 - 145 mmol/L Final   Potassium 07/25/2022 4.0  3.5 - 5.1 mmol/L Final   Chloride 07/25/2022 104  98 - 111 mmol/L Final   CO2 07/25/2022 29  22 - 32 mmol/L Final   Glucose, Bld 07/25/2022 83  70 - 99 mg/dL Final   Glucose reference range applies only to samples taken after fasting for at least 8 hours.   BUN 07/25/2022 11  6 - 20 mg/dL Final   Creatinine, Ser 07/25/2022 0.97  0.44 - 1.00 mg/dL Final   Calcium 13/05/6577 9.2  8.9 - 10.3 mg/dL Final   Total Protein 46/96/2952 7.0  6.5 - 8.1 g/dL Final   Albumin 84/13/2440 3.8  3.5 - 5.0 g/dL Final   AST 08/10/2535 18  15 - 41 U/L Final   ALT 07/25/2022 22  0 - 44 U/L Final   Alkaline Phosphatase 07/25/2022 64  38 - 126 U/L Final   Total Bilirubin 07/25/2022 0.2 (L)  0.3 - 1.2 mg/dL Final   GFR, Estimated  07/25/2022 >60  >60 mL/min Final   Comment: (NOTE) Calculated using the CKD-EPI Creatinine Equation (2021)    Anion gap 07/25/2022 5  5 - 15 Final   Performed at [facility], [address], [place], [place] [address]   POC Amphetamine UR 07/25/2022 None Detected  NONE DETECTED (Cut Off Level 1000 ng/mL) Preliminary   POC Secobarbital (BAR) 07/25/2022 None Detected  NONE DETECTED (Cut Off Level 300 ng/mL) Preliminary   POC Buprenorphine (BUP) 07/25/2022 None Detected  NONE DETECTED (Cut Off Level 10 ng/mL) Preliminary   POC Oxazepam (BZO) 07/25/2022 None Detected  NONE DETECTED (Cut Off Level 300 ng/mL) Preliminary   POC Cocaine UR 07/25/2022 None Detected  NONE DETECTED (Cut Off Level 300 ng/mL) Preliminary   POC Methamphetamine UR 07/25/2022 None Detected  NONE DETECTED (Cut Off Level 1000 ng/mL) Preliminary   POC Morphine 07/25/2022 None Detected  NONE DETECTED (Cut Off Level 300 ng/mL) Preliminary   POC Methadone UR 07/25/2022 None Detected  NONE DETECTED (Cut Off Level 300 ng/mL) Preliminary   POC Oxycodone UR 07/25/2022 None Detected  NONE DETECTED (Cut Off Level 100 ng/mL) Preliminary   POC Marijuana UR 07/25/2022 None Detected  NONE DETECTED (  Cut Off Level 50 ng/mL) Preliminary   SARSCOV2ONAVIRUS 2 AG 07/25/2022 NEGATIVE  NEGATIVE Final   Comment: (NOTE) SARS-CoV-2 antigen NOT DETECTED.   Negative results are presumptive.  Negative results do not preclude SARS-CoV-2 infection and should not be used as the sole basis for treatment or other patient management decisions, including infection  control decisions, particularly in the presence of clinical signs and  symptoms consistent with COVID-19, or in those who have been in contact with the virus.  Negative results must be combined with clinical observations, patient history, and epidemiological information. The expected result is Negative.  Fact Sheet for Patients: https://www.jennings-kim.com/  Fact Sheet for  Healthcare Providers: https://alexander-rogers.biz/  This test is not yet approved or cleared by the [place] FDA and  has been authorized for detection and/or diagnosis of SARS-CoV-2 by FDA under an Emergency Use Authorization (EUA).  This EUA will remain in effect (meaning this test can be used) for the duration of  the COV                          ID-19 declaration under Section 564(b)(1) of the Act, 21 U.S.C. section 360bbb-3(b)(1), unless the authorization is terminated or revoked sooner.     Cholesterol 07/25/2022 181  0 - 200 mg/dL Final   Triglycerides 16/07/9603 118  <150 mg/dL Final   HDL 54/06/8118 45  >40 mg/dL Final   Total CHOL/HDL Ratio 07/25/2022 4.0  RATIO Final   VLDL 07/25/2022 24  0 - 40 mg/dL Final   LDL Cholesterol 07/25/2022 112 (H)  0 - 99 mg/dL Final   Comment:        Total Cholesterol/HDL:CHD Risk Coronary Heart Disease Risk Table                     Men   Women  1/2 Average Risk   3.4   3.3  Average Risk       5.0   4.4  2 X Average Risk   9.6   7.1  3 X Average Risk  23.4   11.0        Use the calculated Patient Ratio above and the CHD Risk Table to determine the patient's CHD Risk.        ATP III CLASSIFICATION (LDL):  <100     mg/dL   Optimal  147-829  mg/dL   Near or Above                    Optimal  130-159  mg/dL   Borderline  562-130  mg/dL   High  >865     mg/dL   Very High Performed at [facility], [address] [place], [place] [address]    TSH 07/25/2022 6.668 (H)  0.350 - 4.500 uIU/mL Final   Comment: Performed by a 3rd Generation assay with a functional sensitivity of <=0.01 uIU/mL. Performed at [facility], [address], [place], [place] [address]    Glucose-Capillary 07/26/2022 104 (H)  70 - 99 mg/dL Final   Glucose reference range applies only to samples taken after fasting for at least 8 hours.   T3, Free 07/31/2022 2.3  2.0 - 4.4 pg/mL Final   Comment: (NOTE) Performed At: [facility] [address] [place], [place] [id number] Jolene Schimke MD UU:[id number]    Free T4 07/31/2022 0.60 (L)  0.61 - 1.12 ng/dL Final   Comment: (NOTE) Biotin  ingestion may interfere with free T4 tests. If the results are inconsistent with the TSH level, previous test results, or the clinical presentation, then consider biotin interference. If needed, order repeat testing after stopping biotin. Performed at [facility], [address], [place], [place] [address]   Admission on 07/04/2022, Discharged on 07/05/2022  Component Date Value Ref Range Status   Sodium 07/04/2022 142  135 - 145 mmol/L Final   Potassium 07/04/2022 4.4  3.5 - 5.1 mmol/L Final   Chloride 07/04/2022 109  98 - 111 mmol/L Final   CO2 07/04/2022 26  22 - 32 mmol/L Final   Glucose, Bld 07/04/2022 96  70 - 99 mg/dL Final   Glucose reference range applies only to samples taken after fasting for at least 8 hours.   BUN 07/04/2022 15  6 - 20 mg/dL Final   Creatinine, Ser 07/04/2022 0.83  0.44 - 1.00 mg/dL Final   Calcium 60/45/4098 9.3  8.9 - 10.3 mg/dL Final   Total Protein 11/91/4782 7.2  6.5 - 8.1 g/dL Final   Albumin 95/62/1308 3.9  3.5 - 5.0 g/dL Final   AST 65/78/4696 23  15 - 41 U/L Final   ALT 07/04/2022 28  0 - 44 U/L Final   Alkaline Phosphatase 07/04/2022 77  38 - 126 U/L Final   Total Bilirubin 07/04/2022 0.4  0.3 - 1.2 mg/dL Final   GFR, Estimated 07/04/2022 >60  >60 mL/min Final   Comment: (NOTE) Calculated using the CKD-EPI Creatinine Equation (2021)    Anion gap 07/04/2022 7  5 - 15 Final   Performed at [facility], [address], [place], [place] [address]   Alcohol, Ethyl (B) 07/04/2022 <10  <10 mg/dL Final   Comment: (NOTE) Lowest detectable limit for serum alcohol is 10 mg/dL.  For medical purposes only. Performed at [facility], [address], [place], [place] [address]    WBC 07/04/2022 7.0  4.0 - 10.5 K/uL Final   RBC 07/04/2022  4.18  3.87 - 5.11 MIL/uL Final   Hemoglobin 07/04/2022 12.8  12.0 - 15.0 g/dL Final   HCT 40/07/2724 39.1  36.0 - 46.0 % Final   MCV 07/04/2022 93.5  80.0 - 100.0 fL Final   MCH 07/04/2022 30.6  26.0 - 34.0 pg Final   MCHC 07/04/2022 32.7  30.0 - 36.0 g/dL Final   RDW 36/64/4034 12.9  11.5 - 15.5 % Final   Platelets 07/04/2022 243  150 - 400 K/uL Final   nRBC 07/04/2022 0.0  0.0 - 0.2 % Final   Neutrophils Relative % 07/04/2022 43  % Final   Neutro Abs 07/04/2022 3.0  1.7 - 7.7 K/uL Final   Lymphocytes Relative 07/04/2022 50  % Final   Lymphs Abs 07/04/2022 3.5  0.7 - 4.0 K/uL Final   Monocytes Relative 07/04/2022 7  % Final   Monocytes Absolute 07/04/2022 0.5  0.1 - 1.0 K/uL Final   Eosinophils Relative 07/04/2022 0  % Final   Eosinophils Absolute 07/04/2022 0.0  0.0 - 0.5 K/uL Final   Basophils Relative 07/04/2022 0  % Final   Basophils Absolute 07/04/2022 0.0  0.0 - 0.1 K/uL Final   Immature Granulocytes 07/04/2022 0  % Final   Abs Immature Granulocytes 07/04/2022 0.01  0.00 - 0.07 K/uL Final   Performed at [facility], [address] [place], [place] [address]   I-stat hCG, quantitative 07/04/2022 <5.0  <5 mIU/mL Final   Comment 3 07/04/2022  Final   Comment:   GEST. AGE      CONC.  (mIU/mL)   <=1 WEEK        5 - 50     2 WEEKS       50 - 500     3 WEEKS       100 - 10,000     4 WEEKS     1,000 - 30,000        FEMALE AND NON-PREGNANT FEMALE:     LESS THAN 5 mIU/mL   Admission on 06/14/2022, Discharged on 06/14/2022  Component Date Value Ref Range Status   Color, UA 06/14/2022 yellow  yellow Final   Clarity, UA 06/14/2022 cloudy (A)  clear Final   Glucose, UA 06/14/2022 negative  negative mg/dL Final   Bilirubin, UA 16/07/9603 negative  negative Final   Ketones, POC UA 06/14/2022 negative  negative mg/dL Final   Spec Grav, UA 54/06/8118 1.025  1.010 - 1.025 Final   Blood, UA 06/14/2022 negative  negative Final   pH, UA 06/14/2022 7.5  5.0 - 8.0  Final   Protein Ur, POC 06/14/2022 =30 (A)  negative mg/dL Final   Urobilinogen, UA 06/14/2022 0.2  0.2 or 1.0 E.U./dL Final   Nitrite, UA 14/78/2956 Negative  Negative Final   Leukocytes, UA 06/14/2022 Negative  Negative Final   Preg Test, Ur 06/14/2022 Negative  Negative Final   Specimen Description 06/14/2022 URINE, CLEAN CATCH   Final   Special Requests 06/14/2022    Final                   Value:NONE Performed at [facility], [address], [place], [place] [address]    Culture 06/14/2022 MULTIPLE SPECIES PRESENT, SUGGEST RECOLLECTION (A)   Final   Report Status 06/14/2022 06/16/2022 FINAL   Final  Admission on 06/07/2022, Discharged on 06/10/2022  Component Date Value Ref Range Status   SARS Coronavirus 2 by RT PCR 06/07/2022 NEGATIVE  NEGATIVE Final   Comment: (NOTE) SARS-CoV-2 target nucleic acids are NOT DETECTED.  The SARS-CoV-2 RNA is generally detectable in upper respiratory specimens during the acute phase of infection. The lowest concentration of SARS-CoV-2 viral copies this assay can detect is 138 copies/mL. A negative result does not preclude SARS-Cov-2 infection and should not be used as the sole basis for treatment or other patient management decisions. A negative result may occur with  improper specimen collection/handling, submission of specimen other than nasopharyngeal swab, presence of viral mutation(s) within the areas targeted by this assay, and inadequate number of viral copies(<138 copies/mL). A negative result must be combined with clinical observations, patient history, and epidemiological information. The expected result is Negative.  Fact Sheet for Patients:  BloggerCourse.com  Fact Sheet for Healthcare Providers:  SeriousBroker.it  This test is no                          t yet approved or cleared by the [place] FDA and  has been authorized for detection and/or diagnosis of  SARS-CoV-2 by FDA under an Emergency Use Authorization (EUA). This EUA will remain  in effect (meaning this test can be used) for the duration of the COVID-19 declaration under Section 564(b)(1) of the Act, 21 U.S.C.section 360bbb-3(b)(1), unless the authorization is terminated  or revoked sooner.       Influenza A by PCR 06/07/2022 NEGATIVE  NEGATIVE Final   Influenza B by PCR 06/07/2022 NEGATIVE  NEGATIVE Final   Comment: (NOTE) The Xpert Xpress SARS-CoV-2/FLU/RSV plus assay is intended as an aid in the diagnosis of influenza from Nasopharyngeal swab specimens and should not be used as a sole basis for treatment. Nasal washings and aspirates are unacceptable for Xpert Xpress SARS-CoV-2/FLU/RSV testing.  Fact Sheet for Patients: BloggerCourse.com  Fact Sheet for Healthcare Providers: SeriousBroker.it  This test is not yet approved or cleared by the [place] FDA and has been authorized for detection and/or diagnosis of SARS-CoV-2 by FDA under an Emergency Use Authorization (EUA). This EUA will remain in effect (meaning this test can be used) for the duration of the COVID-19 declaration under Section 564(b)(1) of the Act, 21 U.S.C. section 360bbb-3(b)(1), unless the authorization is terminated or revoked.  Performed at [facility], [address] [place], [place] [address]    WBC 06/07/2022 5.7  4.0 - 10.5 K/uL Final   RBC 06/07/2022 4.39  3.87 - 5.11 MIL/uL Final   Hemoglobin 06/07/2022 13.2  12.0 - 15.0 g/dL Final   HCT 60/45/4098 40.4  36.0 - 46.0 % Final   MCV 06/07/2022 92.0  80.0 - 100.0 fL Final   MCH 06/07/2022 30.1  26.0 - 34.0 pg Final   MCHC 06/07/2022 32.7  30.0 - 36.0 g/dL Final   RDW 11/91/4782 12.4  11.5 - 15.5 % Final   Platelets 06/07/2022 308  150 - 400 K/uL Final   nRBC 06/07/2022 0.0  0.0 - 0.2 % Final   Neutrophils Relative % 06/07/2022 42  % Final   Neutro Abs 06/07/2022 2.4  1.7 - 7.7  K/uL Final   Lymphocytes Relative 06/07/2022 54  % Final   Lymphs Abs 06/07/2022 3.1  0.7 - 4.0 K/uL Final   Monocytes Relative 06/07/2022 4  % Final   Monocytes Absolute 06/07/2022 0.2  0.1 - 1.0 K/uL Final   Eosinophils Relative 06/07/2022 0  % Final   Eosinophils Absolute 06/07/2022 0.0  0.0 - 0.5 K/uL Final   Basophils Relative 06/07/2022 0  % Final   Basophils Absolute 06/07/2022 0.0  0.0 - 0.1 K/uL Final   Immature Granulocytes 06/07/2022 0  % Final   Abs Immature Granulocytes 06/07/2022 0.01  0.00 - 0.07 K/uL Final   Performed at [facility], [address], [place], [place] [address]   Sodium 06/07/2022 139  135 - 145 mmol/L Final   Potassium 06/07/2022 4.0  3.5 - 5.1 mmol/L Final   Chloride 06/07/2022 104  98 - 111 mmol/L Final   CO2 06/07/2022 28  22 - 32 mmol/L Final   Glucose, Bld 06/07/2022 104 (H)  70 - 99 mg/dL Final   Glucose reference range applies only to samples taken after fasting for at least 8 hours.   BUN 06/07/2022 8  6 - 20 mg/dL Final   Creatinine, Ser 06/07/2022 0.84  0.44 - 1.00 mg/dL Final   Calcium 30/86/5784 9.1  8.9 - 10.3 mg/dL Final   Total Protein 69/62/9528 6.9  6.5 - 8.1 g/dL Final   Albumin 41/32/4401 3.7  3.5 - 5.0 g/dL Final   AST 02/72/5366 19  15 - 41 U/L Final   ALT 06/07/2022 22  0 - 44 U/L Final   Alkaline Phosphatase 06/07/2022 54  38 - 126 U/L Final   Total Bilirubin 06/07/2022 0.4  0.3 - 1.2 mg/dL Final   GFR, Estimated 06/07/2022 >60  >60 mL/min Final   Comment: (NOTE) Calculated using the CKD-EPI Creatinine Equation (2021)    Anion  gap 06/07/2022 7  5 - 15 Final   Performed at [facility], [address], [place], [place] [address]   Hgb A1c MFr Bld 06/07/2022 5.0  4.8 - 5.6 % Final   Comment: (NOTE) Pre diabetes:          5.7%-6.4%  Diabetes:              >6.4%  Glycemic control for   <7.0% adults with diabetes    Mean Plasma Glucose 06/07/2022 96.8  mg/dL Final   Performed at [facility], [address] [place], [place] [address]   TSH 06/07/2022 1.620  0.350 - 4.500 uIU/mL Final   Comment: Performed by a 3rd Generation assay with a functional sensitivity of <=0.01 uIU/mL. Performed at [facility], [address] [place], [place] [address]    RPR Ser Ql 06/07/2022 NON REACTIVE  NON REACTIVE Final   Performed at [facility], [address], [place], [place] [address]   Color, Urine 06/07/2022 YELLOW  YELLOW Final   APPearance 06/07/2022 HAZY (A)  CLEAR Final   Specific Gravity, Urine 06/07/2022 1.018  1.005 - 1.030 Final   pH 06/07/2022 7.0  5.0 - 8.0 Final   Glucose, UA 06/07/2022 NEGATIVE  NEGATIVE mg/dL Final   Hgb urine dipstick 06/07/2022 NEGATIVE  NEGATIVE Final   Bilirubin Urine 06/07/2022 NEGATIVE  NEGATIVE Final   Ketones, ur 06/07/2022 NEGATIVE  NEGATIVE mg/dL Final   Protein, ur 03/00/9233 NEGATIVE  NEGATIVE mg/dL Final   Nitrite 00/76/2263 NEGATIVE  NEGATIVE Final   Leukocytes,Ua 06/07/2022 NEGATIVE  NEGATIVE Final   Performed at [facility], [address] [place], [place] [address]   Cholesterol 06/07/2022 164  0 - 200 mg/dL Final   Triglycerides 62/56/3893 158 (H)  <150 mg/dL Final   HDL 73/42/8768 44  >40 mg/dL Final   Total CHOL/HDL Ratio 06/07/2022 3.7  RATIO Final   VLDL 06/07/2022 32  0 - 40 mg/dL Final   LDL Cholesterol 06/07/2022 88  0 - 99 mg/dL Final   Comment:        Total Cholesterol/HDL:CHD Risk Coronary Heart Disease Risk Table                     Men   Women  1/2 Average Risk   3.4   3.3  Average Risk       5.0   4.4  2 X Average Risk   9.6   7.1  3 X Average Risk  23.4   11.0        Use the calculated Patient Ratio above and the CHD Risk Table to determine the patient's CHD Risk.        ATP III CLASSIFICATION (LDL):  <100     mg/dL   Optimal  115-726  mg/dL   Near or Above                    Optimal  130-159  mg/dL   Borderline  203-559  mg/dL   High  >741     mg/dL   Very High Performed at [facility],  [address] [place], [place] [address]    HIV Screen 4th Generation wRfx 06/07/2022 Non Reactive  Non Reactive Final   Performed at [facility], [address] [place], [place] [address]   SARSCOV2ONAVIRUS 2 AG 06/07/2022 NEGATIVE  NEGATIVE Final   Comment: (NOTE)  SARS-CoV-2 antigen NOT DETECTED.   Negative results are presumptive.  Negative results do not preclude SARS-CoV-2 infection and should not be used as the sole basis for treatment or other patient management decisions, including infection  control decisions, particularly in the presence of clinical signs and  symptoms consistent with COVID-19, or in those who have been in contact with the virus.  Negative results must be combined with clinical observations, patient history, and epidemiological information. The expected result is Negative.  Fact Sheet for Patients: https://www.jennings-kim.com/  Fact Sheet for Healthcare Providers: https://alexander-rogers.biz/  This test is not yet approved or cleared by the [place] FDA and  has been authorized for detection and/or diagnosis of SARS-CoV-2 by FDA under an Emergency Use Authorization (EUA).  This EUA will remain in effect (meaning this test can be used) for the duration of  the COV                          ID-19 declaration under Section 564(b)(1) of the Act, 21 U.S.C. section 360bbb-3(b)(1), unless the authorization is terminated or revoked sooner.     POC Amphetamine UR 06/07/2022 None Detected  NONE DETECTED (Cut Off Level 1000 ng/mL) Final   POC Secobarbital (BAR) 06/07/2022 None Detected  NONE DETECTED (Cut Off Level 300 ng/mL) Final   POC Buprenorphine (BUP) 06/07/2022 None Detected  NONE DETECTED (Cut Off Level 10 ng/mL) Final   POC Oxazepam (BZO) 06/07/2022 None Detected  NONE DETECTED (Cut Off Level 300 ng/mL) Final   POC Cocaine UR 06/07/2022 None Detected  NONE DETECTED (Cut Off Level 300 ng/mL) Final   POC Methamphetamine UR  06/07/2022 None Detected  NONE DETECTED (Cut Off Level 1000 ng/mL) Final   POC Morphine 06/07/2022 None Detected  NONE DETECTED (Cut Off Level 300 ng/mL) Final   POC Methadone UR 06/07/2022 None Detected  NONE DETECTED (Cut Off Level 300 ng/mL) Final   POC Oxycodone UR 06/07/2022 None Detected  NONE DETECTED (Cut Off Level 100 ng/mL) Final   POC Marijuana UR 06/07/2022 None Detected  NONE DETECTED (Cut Off Level 50 ng/mL) Final   Preg Test, Ur 06/08/2022 NEGATIVE  NEGATIVE Final   Comment:        THE SENSITIVITY OF THIS METHODOLOGY IS >20 mIU/mL. Performed at [facility], [address], [place], [place] [address]    Preg Test, Ur 06/08/2022 NEGATIVE  NEGATIVE Final   Comment:        THE SENSITIVITY OF THIS METHODOLOGY IS >24 mIU/mL   Admission on 02/07/2022, Discharged on 02/08/2022  Component Date Value Ref Range Status   SARS Coronavirus 2 by RT PCR 02/07/2022 NEGATIVE  NEGATIVE Final   Comment: (NOTE) SARS-CoV-2 target nucleic acids are NOT DETECTED.  The SARS-CoV-2 RNA is generally detectable in upper respiratory specimens during the acute phase of infection. The lowest concentration of SARS-CoV-2 viral copies this assay can detect is 138 copies/mL. A negative result does not preclude SARS-Cov-2 infection and should not be used as the sole basis for treatment or other patient management decisions. A negative result may occur with  improper specimen collection/handling, submission of specimen other than nasopharyngeal swab, presence of viral mutation(s) within the areas targeted by this assay, and inadequate number of viral copies(<138 copies/mL). A negative result must be combined with clinical observations, patient history, and epidemiological information. The expected result is Negative.  Fact Sheet for Patients:  BloggerCourse.com  Fact Sheet for Healthcare Providers:  SeriousBroker.it  This  test is no                           t yet approved or cleared by the [place] FDA and  has been authorized for detection and/or diagnosis of SARS-CoV-2 by FDA under an Emergency Use Authorization (EUA). This EUA will remain  in effect (meaning this test can be used) for the duration of the COVID-19 declaration under Section 564(b)(1) of the Act, 21 U.S.C.section 360bbb-3(b)(1), unless the authorization is terminated  or revoked sooner.       Influenza A by PCR 02/07/2022 NEGATIVE  NEGATIVE Final   Influenza B by PCR 02/07/2022 NEGATIVE  NEGATIVE Final   Comment: (NOTE) The Xpert Xpress SARS-CoV-2/FLU/RSV plus assay is intended as an aid in the diagnosis of influenza from Nasopharyngeal swab specimens and should not be used as a sole basis for treatment. Nasal washings and aspirates are unacceptable for Xpert Xpress SARS-CoV-2/FLU/RSV testing.  Fact Sheet for Patients: BloggerCourse.comhttps://www.fda.gov/media/152166/download  Fact Sheet for Healthcare Providers: SeriousBroker.ithttps://www.fda.gov/media/152162/download  This test is not yet approved or cleared by the [place] FDA and has been authorized for detection and/or diagnosis of SARS-CoV-2 by FDA under an Emergency Use Authorization (EUA). This EUA will remain in effect (meaning this test can be used) for the duration of the COVID-19 declaration under Section 564(b)(1) of the Act, 21 U.S.C. section 360bbb-3(b)(1), unless the authorization is terminated or revoked.  Performed at [facility], [address] [place], [place] [address]    WBC 02/07/2022 8.1  4.0 - 10.5 K/uL Final   RBC 02/07/2022 4.55  3.87 - 5.11 MIL/uL Final   Hemoglobin 02/07/2022 13.6  12.0 - 15.0 g/dL Final   HCT 19/14/782904/27/2023 41.9  36.0 - 46.0 % Final   MCV 02/07/2022 92.1  80.0 - 100.0 fL Final   MCH 02/07/2022 29.9  26.0 - 34.0 pg Final   MCHC 02/07/2022 32.5  30.0 - 36.0 g/dL Final   RDW 56/21/308604/27/2023 12.9  11.5 - 15.5 % Final   Platelets 02/07/2022 286  150 - 400 K/uL Final   nRBC  02/07/2022 0.0  0.0 - 0.2 % Final   Neutrophils Relative % 02/07/2022 41  % Final   Neutro Abs 02/07/2022 3.3  1.7 - 7.7 K/uL Final   Lymphocytes Relative 02/07/2022 53  % Final   Lymphs Abs 02/07/2022 4.3 (H)  0.7 - 4.0 K/uL Final   Monocytes Relative 02/07/2022 6  % Final   Monocytes Absolute 02/07/2022 0.5  0.1 - 1.0 K/uL Final   Eosinophils Relative 02/07/2022 0  % Final   Eosinophils Absolute 02/07/2022 0.0  0.0 - 0.5 K/uL Final   Basophils Relative 02/07/2022 0  % Final   Basophils Absolute 02/07/2022 0.0  0.0 - 0.1 K/uL Final   Immature Granulocytes 02/07/2022 0  % Final   Abs Immature Granulocytes 02/07/2022 0.02  0.00 - 0.07 K/uL Final   Performed at [facility], [address] [place], [place] [address]   Sodium 02/07/2022 137  135 - 145 mmol/L Final   Potassium 02/07/2022 4.1  3.5 - 5.1 mmol/L Final   Chloride 02/07/2022 104  98 - 111 mmol/L Final   CO2 02/07/2022 29  22 - 32 mmol/L Final   Glucose, Bld 02/07/2022 92  70 - 99 mg/dL Final   Glucose reference range applies only to samples taken after fasting for at least 8 hours.   BUN 02/07/2022 10  6 -  20 mg/dL Final   Creatinine, Ser 02/07/2022 0.88  0.44 - 1.00 mg/dL Final   Calcium 02/07/2022 9.4  8.9 - 10.3 mg/dL Final   Total Protein 02/07/2022 7.2  6.5 - 8.1 g/dL Final   Albumin 02/07/2022 3.9  3.5 - 5.0 g/dL Final   AST 02/07/2022 18  15 - 41 U/L Final   ALT 02/07/2022 25  0 - 44 U/L Final   Alkaline Phosphatase 02/07/2022 66  38 - 126 U/L Final   Total Bilirubin 02/07/2022 0.2 (L)  0.3 - 1.2 mg/dL Final   GFR, Estimated 02/07/2022 >60  >60 mL/min Final   Comment: (NOTE) Calculated using the CKD-EPI Creatinine Equation (2021)    Anion gap 02/07/2022 4 (L)  5 - 15 Final   Performed at [place] [address], [place], [place] [address]   Hgb A1c MFr Bld 02/07/2022 5.0  4.8 - 5.6 % Final   Comment: (NOTE) Pre diabetes:          5.7%-6.4%  Diabetes:              >6.4%  Glycemic control for    <7.0% adults with diabetes    Mean Plasma Glucose 02/07/2022 96.8  mg/dL Final   Performed at [place] [address] [place], [place] [address]   Alcohol, Ethyl (B) 02/07/2022 <10  <10 mg/dL Final   Comment: (NOTE) Lowest detectable limit for serum alcohol is 10 mg/dL.  For medical purposes only. Performed at [facility], [place] [address], [place], [place] [address]    Cholesterol 02/07/2022 192  0 - 200 mg/dL Final   Triglycerides 02/07/2022 192 (H)  <150 mg/dL Final   HDL 02/07/2022 46  >40 mg/dL Final   Total CHOL/HDL Ratio 02/07/2022 4.2  RATIO Final   VLDL 02/07/2022 38  0 - 40 mg/dL Final   LDL Cholesterol 02/07/2022 108 (H)  0 - 99 mg/dL Final   Comment:        Total Cholesterol/HDL:CHD Risk Coronary Heart Disease Risk Table                     Men   Women  1/2 Average Risk   3.4   3.3  Average Risk       5.0   4.4  2 X Average Risk   9.6   7.1  3 X Average Risk  23.4   11.0        Use the calculated Patient Ratio above and the CHD Risk Table to determine the patient's CHD Risk.        ATP III CLASSIFICATION (LDL):  <100     mg/dL   Optimal  100-129  mg/dL   Near or Above                    Optimal  130-159  mg/dL   Borderline  160-189  mg/dL   High  >190     mg/dL   Very High Performed at [place] [address] [place], [place] [address]    TSH 02/07/2022 6.344 (H)  0.350 - 4.500 uIU/mL Final   Comment: Performed by a 3rd Generation assay with a functional sensitivity of <=0.01 uIU/mL. Performed at [facility], [place] [address], [place], [place] [address]    POC Amphetamine UR 02/07/2022 None Detected  NONE DETECTED (Cut Off Level 1000 ng/mL) Final   POC Secobarbital (BAR) 02/07/2022 None  Detected  NONE DETECTED (Cut Off Level 300 ng/mL) Final   POC Buprenorphine (BUP) 02/07/2022 None Detected  NONE DETECTED (Cut Off Level 10 ng/mL) Final   POC Oxazepam (BZO) 02/07/2022 None Detected  NONE DETECTED (Cut Off Level 300 ng/mL)  Final   POC Cocaine UR 02/07/2022 None Detected  NONE DETECTED (Cut Off Level 300 ng/mL) Final   POC Methamphetamine UR 02/07/2022 None Detected  NONE DETECTED (Cut Off Level 1000 ng/mL) Final   POC Morphine 02/07/2022 None Detected  NONE DETECTED (Cut Off Level 300 ng/mL) Final   POC Oxycodone UR 02/07/2022 None Detected  NONE DETECTED (Cut Off Level 100 ng/mL) Final   POC Methadone UR 02/07/2022 None Detected  NONE DETECTED (Cut Off Level 300 ng/mL) Final   POC Marijuana UR 02/07/2022 None Detected  NONE DETECTED (Cut Off Level 50 ng/mL) Final   SARSCOV2ONAVIRUS 2 AG 02/07/2022 NEGATIVE  NEGATIVE Final   Comment: (NOTE) SARS-CoV-2 antigen NOT DETECTED.   Negative results are presumptive.  Negative results do not preclude SARS-CoV-2 infection and should not be used as the sole basis for treatment or other patient management decisions, including infection  control decisions, particularly in the presence of clinical signs and  symptoms consistent with COVID-19, or in those who have been in contact with the virus.  Negative results must be combined with clinical observations, patient history, and epidemiological information. The expected result is Negative.  Fact Sheet for Patients: https://www.jennings-kim.com/  Fact Sheet for Healthcare Providers: https://alexander-rogers.biz/  This test is not yet approved or cleared by the [place] FDA and  has been authorized for detection and/or diagnosis of SARS-CoV-2 by FDA under an Emergency Use Authorization (EUA).  This EUA will remain in effect (meaning this test can be used) for the duration of  the COV                          ID-19 declaration under Section 564(b)(1) of the Act, 21 U.S.C. section 360bbb-3(b)(1), unless the authorization is terminated or revoked sooner.     Preg Test, Ur 02/07/2022 NEGATIVE  NEGATIVE Final   Comment:        THE SENSITIVITY OF THIS METHODOLOGY IS >24 mIU/mL     Blood  Alcohol level:  Lab Results  Component Value Date   ETH <10 07/04/2022   ETH <10 02/07/2022    Metabolic Disorder Labs: Lab Results  Component Value Date   HGBA1C 5.0 06/07/2022   MPG 96.8 06/07/2022   MPG 96.8 02/07/2022   No results found for: "PROLACTIN" Lab Results  Component Value Date   CHOL 181 07/25/2022   TRIG 118 07/25/2022   HDL 45 07/25/2022   CHOLHDL 4.0 07/25/2022   VLDL 24 07/25/2022   LDLCALC 112 (H) 07/25/2022   LDLCALC 88 06/07/2022    Therapeutic Lab Levels: No results found for: "LITHIUM" No results found for: "VALPROATE" No results found for: "CBMZ"  Physical Findings   GAD-7    Flowsheet Row [facility]17/2021 [facility]  Total GAD-7 Score 17      PHQ2-9    Flowsheet Row ED from 06/07/2022 in [facility] Office Visit from 12/17/2021 in [facility]  PHQ-2 Total Score 1 2  PHQ-9 Total Score 2 8      Flowsheet Row ED from 07/25/2022 in [facility] ED from 07/04/2022 in [facility]  ED from 06/14/2022 in [facility]   C-SSRS RISK CATEGORY High Risk No Risk No Risk        Musculoskeletal  Strength & Muscle Tone: within normal limits Gait & Station: normal Patient leans: N/A  Psychiatric Specialty Exam  Presentation  General Appearance:  Appropriate for Environment  Eye Contact: Good  Speech: Clear and Coherent; Normal Rate  Speech Volume: Normal  Handedness: Right   Mood and Affect  Mood: Euthymic  Affect: Appropriate; Congruent; Full Range   Thought Process  Thought Processes: Coherent; Goal Directed; Linear  Descriptions of Associations:Intact  Orientation:Full (Time, Place and Person)  Thought Content:Logical; WDL  Diagnosis of Schizophrenia or Schizoaffective disorder in past: No    Hallucinations:Denied AVH   Ideas of  Reference:None  Suicidal Thoughts:Suicidal Thoughts: No (Contracted to safety.  Last SI or HI was on admission.) SI Active Intent and/or Plan:  (Denied)  Homicidal Thoughts:Homicidal Thoughts: No HI Active Intent and/or Plan:  (Denied) HI Passive Intent and/or Plan:  (Denied)    Sensorium  Memory: Immediate Good; Recent Good  Judgment: Fair  Insight: Fair   Executive Functions  Concentration: Good  Attention Span: Good  Recall: Good  Fund of Knowledge: Fair  Language: Good   Psychomotor Activity  Psychomotor Activity: Normal    Assets  Assets: Communication Skills; Desire for Improvement; Leisure Time; Social Support   Sleep  Sleep: Good   Physical Exam  Physical Exam Vitals and nursing note reviewed.  Constitutional:      General: She is not in acute distress.    Appearance: She is not ill-appearing or diaphoretic.  HENT:     Head: Normocephalic and atraumatic.  Pulmonary:     Effort: Pulmonary effort is normal. No respiratory distress.  Neurological:     General: No focal deficit present.     Mental Status: She is alert.     Blood pressure 109/66, pulse 60, temperature (!) 97.5 F (36.4 C), temperature source Oral, resp. rate 19, SpO2 96 %. There is no height or weight on file to calculate BMI.  Treatment Plan Summary: [facility] team continues to seek placement for patient.   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, recurrent herpes infection, GERD, hypothyroidism  Current sch rx:  ARIPiprazole ER, 400 mg, Q28 days clotrimazole, , BID levothyroxine, 100 mcg, Q0600 nicotine, 14 mg, Q0600 ondansetron, 4 mg, Once Oxcarbazepine, 300 mg, BID pantoprazole, 20 mg, Daily QUEtiapine, 400 mg, BID senna-docusate, 1 tablet, BID sertraline, 150 mg, Daily valACYclovir, 500 mg, Daily    Dispo Tentative date: Unsure Barrier: Housing Received proof of guardianship  Pending fax of release of information from guardian  Patient  continues to be psychiatric stable and ready for dispo pending placement availability  Signed: Princess Bruins, DO Psychiatry Resident, PGY-2 08/05/2022, 10:42 AM  BEHAVIORAL [facility] [address] [place] [address] Dept: [phone] Dept Fax: [phone]

## 2022-08-06 NOTE — ED Notes (Signed)
Pt is awake and alert. No distress noted. Pt is sitting up after eating breakfast and given something to drink.  Affect and mood is congruent.

## 2022-08-06 NOTE — ED Notes (Signed)
Pt is A & O x 4. Pt is quietly sitting watching TV. Pt denies SI/HI/AVH. Will continue to monitor.

## 2022-08-06 NOTE — ED Provider Notes (Addendum)
Behavioral Health Progress Note  Date and Time: 08/06/2022 10:30 AM Name: Nicole [name] MRN:  [id number]   HPI: Nicole [name] is a 31 y.o. female, with PMH of SCZ unspecified, MDD with psychosis, who presented voluntary to [facility] (07/25/2022) via [facility] after a verbal altercation with [name] (not patient's legal guardian) for transient SI.  This is patient's fourth visit to [facility] for similar concerns this year. Patient has been dismissed from previous group home due to threatening SI and HI, then leaving the group home.   Legal Guardian: Mom (Maxine Whitewood) Point of contact: [name]) APS is following.   Subjective:  Patient was initially seen laying in bed awake, comfortable, no acute distress.  Reported having transient full-body generalized "burning sensation" but self-resolved.  Patient was unable to identify if this was associated with her medication, or if she took it before her medication.  Patient unable to identify a possible cause.  Reported the sensation has happened once before, "way back when when I was first diagnosed with schizophrenia".  Stated she does not know what relieved the symptoms back then.  Instructed patient to note down if the symptom was to happen again, when, before or after medications, how long, other associated symptoms.  That she understood.  Otherwise patient stated that she is still mildly constipated, able to have a bowel movement yesterday.  No other concerns or questions.  [id number] Thoughts: No (Contracted to safety.  Last SI or HI was on admission.) SI Active Intent and/or Plan:  (Denied) [id number] Thoughts: No HI Active Intent and/or Plan:  (Denied) HI Passive Intent and/or Plan:  (Denied) [id number]: None Description of Auditory Hallucinations: Denied Description of Visual Hallucinations: Denied  Mood: Euthymic Sleep:Good (No nightmares) Appetite: Good  Review of Systems  Respiratory:  Negative for shortness of breath.    Cardiovascular:  Negative for chest pain.  Gastrointestinal:  Negative for nausea and vomiting.  Neurological:  Negative for dizziness and headaches.    Diagnosis:  Final diagnoses:  Severe episode of recurrent major depressive disorder, with psychotic features (HCC)    Total Time spent with patient: 20 minutes  Past Psychiatric History: schizophrenia, schizoaffective disorder, MDD, suicidal ideation Past Medical History:  Past Medical History:  Diagnosis Date   Anxiety    Depression     Past Surgical History:  Procedure Laterality Date   WISDOM TOOTH EXTRACTION Bilateral 2020   Family History:  Family History  Problem Relation Age of Onset   Hypertension Father    Diabetes Father    Family Psychiatric  History: none reported Social History:  Social History   Substance and Sexual Activity  Alcohol Use Never     Social History   Substance and Sexual Activity  Drug Use Never    Social History   Socioeconomic History   Marital status: Single    Spouse name: Not on file   Number of children: Not on file   Years of education: Not on file   Highest education level: Not on file  Occupational History   Not on file  Tobacco Use   Smoking status: Every Day    Types: Cigarettes   Smokeless tobacco: Never  Vaping Use   Vaping Use: Some days  Substance and Sexual Activity   Alcohol use: Never   Drug use: Never   Sexual activity: Yes    Partners: Female    Birth control/protection: Implant  Other Topics Concern   Not on file  Social History Narrative  Not on file   Social Determinants of Health   Financial Resource Strain: Not on file  Food Insecurity: Not on file  Transportation Needs: Not on file  Physical Activity: Not on file  Stress: Not on file  Social Connections: Not on file   SDOH:  SDOH Screenings   Depression (PHQ2-9): Low Risk  (06/10/2022)  Tobacco Use: High Risk (08/03/2022)   Additional Social History:    Pain Medications: SEE  MAR Prescriptions: SEE MAR Over the Counter: SEE MAR History of alcohol / drug use?: Yes Longest period of sobriety (when/how long): N/A Negative Consequences of Use:  (NONE) Withdrawal Symptoms: None Name of Substance 1: ETOH IN PAST--PT CURRENTLY DENIES 1 - Frequency: SOCIAL USE ETOH IN PAST 1 - Method of Aquiring: LEGAL 1- Route of Use: ORAL DRINK Name of Substance 2: [place] 2 - Amount (size/oz): LESS THAN ONE PACK 2 - Frequency: DAILY 2 - Method of Aquiring: LEGAL 2 - Route of Substance Use: ORAL SMOKE                Current Medications:  Current Facility-Administered Medications  Medication Dose Route Frequency Provider Last Rate Last Admin   acetaminophen (TYLENOL) tablet 650 mg  650 mg Oral Q6H PRN Onuoha, Chinwendu V, NP   650 mg at 07/31/22 1328   alum & mag hydroxide-simeth (MAALOX/MYLANTA) 200-200-20 MG/5ML suspension 30 mL  30 mL Oral Q4H PRN Onuoha, Chinwendu V, NP   30 mL at 07/27/22 2151   ARIPiprazole ER (ABILIFY MAINTENA) injection 400 mg  400 mg Intramuscular Q28 days Merrily Brittle, DO   400 mg at 08/02/22 1154   clotrimazole (LOTRIMIN) 1 % cream   Topical BID Bobbitt, Shalon E, NP   Given at 08/06/22 0913   hydrOXYzine (ATARAX) tablet 25 mg  25 mg Oral TID PRN Onuoha, Chinwendu V, NP   25 mg at 08/06/22 [id number]   levothyroxine (SYNTHROID) tablet 100 mcg  100 mcg Oral Q0600 Merrily Brittle, DO   100 mcg at 08/06/22 [id number]   magnesium hydroxide (MILK OF MAGNESIA) suspension 30 mL  30 mL Oral Daily PRN Onuoha, Chinwendu V, NP   30 mL at 08/02/22 1956   nicotine (NICODERM CQ - dosed in mg/24 hours) patch 14 mg  14 mg Transdermal Q0600 Onuoha, Chinwendu V, NP   14 mg at 08/06/22 0852   ondansetron (ZOFRAN) tablet 4 mg  4 mg Oral Once Nwoko, Uchenna E, PA       Oxcarbazepine (TRILEPTAL) tablet 300 mg  300 mg Oral BID Onuoha, Chinwendu V, NP   300 mg at 08/06/22 0913   pantoprazole (PROTONIX) EC tablet 20 mg  20 mg Oral Daily Merrily Brittle, DO   20 mg  at 08/06/22 0919   QUEtiapine (SEROQUEL) tablet 400 mg  400 mg Oral BID Onuoha, Chinwendu V, NP   400 mg at 08/06/22 0913   senna-docusate (Senokot-S) tablet 1 tablet  1 tablet Oral BID Merrily Brittle, DO   1 tablet at 08/06/22 0919   sertraline (ZOLOFT) tablet 150 mg  150 mg Oral Daily Onuoha, Chinwendu V, NP   150 mg at 08/06/22 0913   traZODone (DESYREL) tablet 50 mg  50 mg Oral QHS PRN Onuoha, Chinwendu V, NP   50 mg at 08/05/22 2122   valACYclovir (VALTREX) tablet 500 mg  500 mg Oral Daily Merrily Brittle, DO   500 mg at 08/06/22 [id number]   Current Outpatient Medications  Medication Sig Dispense Refill   ABILIFY MAINTENA 400  MG PRSY prefilled syringe 400 mg every 28 (twenty-eight) days.     cetirizine (ZYRTEC) 10 MG tablet Take 10 mg by mouth daily.     cyclobenzaprine (FLEXERIL) 10 MG tablet Take 1 tablet (10 mg total) by mouth 2 (two) times daily as needed for muscle spasms. 20 tablet 0   fluticasone (FLONASE) 50 MCG/ACT nasal spray Place 1 spray into both nostrils daily.     hydrOXYzine (ATARAX) 50 MG tablet Take 1 tablet (50 mg total) by mouth 2 (two) times daily. 60 tablet 0   meloxicam (MOBIC) 15 MG tablet Take 15 mg by mouth daily.     Oxcarbazepine (TRILEPTAL) 300 MG tablet Take 1 tablet (300 mg total) by mouth 2 (two) times daily. 60 tablet 0   QUEtiapine (SEROQUEL) 400 MG tablet Take 1 tablet (400 mg total) by mouth 2 (two) times daily. 60 tablet 0   sertraline (ZOLOFT) 50 MG tablet Take 3 tablets (150 mg total) by mouth in the morning. 90 tablet 0   traZODone (DESYREL) 100 MG tablet Take 1 tablet (100 mg total) by mouth at bedtime. 30 tablet 0   valACYclovir (VALTREX) 500 MG tablet Take 500 mg by mouth daily.      Labs  Lab Results:  Admission on 07/25/2022  Component Date Value Ref Range Status   SARS Coronavirus 2 by RT PCR 07/25/2022 NEGATIVE  NEGATIVE Final   Comment: (NOTE) SARS-CoV-2 target nucleic acids are NOT DETECTED.  The SARS-CoV-2 RNA is generally detectable in  upper respiratory specimens during the acute phase of infection. The lowest concentration of SARS-CoV-2 viral copies this assay can detect is 138 copies/mL. A negative result does not preclude SARS-Cov-2 infection and should not be used as the sole basis for treatment or other patient management decisions. A negative result may occur with  improper specimen collection/handling, submission of specimen other than nasopharyngeal swab, presence of viral mutation(s) within the areas targeted by this assay, and inadequate number of viral copies(<138 copies/mL). A negative result must be combined with clinical observations, patient history, and epidemiological information. The expected result is Negative.  Fact Sheet for Patients:  EntrepreneurPulse.com.au  Fact Sheet for Healthcare Providers:  IncredibleEmployment.be  This test is no                          t yet approved or cleared by the [place] FDA and  has been authorized for detection and/or diagnosis of SARS-CoV-2 by FDA under an Emergency Use Authorization (EUA). This EUA will remain  in effect (meaning this test can be used) for the duration of the COVID-19 declaration under Section 564(b)(1) of the Act, 21 U.S.C.section 360bbb-3(b)(1), unless the authorization is terminated  or revoked sooner.       Influenza A by PCR 07/25/2022 NEGATIVE  NEGATIVE Final   Influenza B by PCR 07/25/2022 NEGATIVE  NEGATIVE Final   Comment: (NOTE) The Xpert Xpress SARS-CoV-2/FLU/RSV plus assay is intended as an aid in the diagnosis of influenza from Nasopharyngeal swab specimens and should not be used as a sole basis for treatment. Nasal washings and aspirates are unacceptable for Xpert Xpress SARS-CoV-2/FLU/RSV testing.  Fact Sheet for Patients: EntrepreneurPulse.com.au  Fact Sheet for Healthcare Providers: IncredibleEmployment.be  This test is not yet approved  or cleared by the [place] FDA and has been authorized for detection and/or diagnosis of SARS-CoV-2 by FDA under an Emergency Use Authorization (EUA). This EUA will remain in effect (meaning this test can  be used) for the duration of the COVID-19 declaration under Section 564(b)(1) of the Act, 21 U.S.C. section 360bbb-3(b)(1), unless the authorization is terminated or revoked.  Performed at [facility], [place] [address], [place], [place] [address]    WBC 07/25/2022 8.3  4.0 - 10.5 K/uL Final   RBC 07/25/2022 4.44  3.87 - 5.11 MIL/uL Final   Hemoglobin 07/25/2022 13.7  12.0 - 15.0 g/dL Final   HCT 07/25/2022 40.2  36.0 - 46.0 % Final   MCV 07/25/2022 90.5  80.0 - 100.0 fL Final   MCH 07/25/2022 30.9  26.0 - 34.0 pg Final   MCHC 07/25/2022 34.1  30.0 - 36.0 g/dL Final   RDW 07/25/2022 12.2  11.5 - 15.5 % Final   Platelets 07/25/2022 248  150 - 400 K/uL Final   nRBC 07/25/2022 0.0  0.0 - 0.2 % Final   Neutrophils Relative % 07/25/2022 43  % Final   Neutro Abs 07/25/2022 3.6  1.7 - 7.7 K/uL Final   Lymphocytes Relative 07/25/2022 52  % Final   Lymphs Abs 07/25/2022 4.2 (H)  0.7 - 4.0 K/uL Final   Monocytes Relative 07/25/2022 5  % Final   Monocytes Absolute 07/25/2022 0.4  0.1 - 1.0 K/uL Final   Eosinophils Relative 07/25/2022 0  % Final   Eosinophils Absolute 07/25/2022 0.0  0.0 - 0.5 K/uL Final   Basophils Relative 07/25/2022 0  % Final   Basophils Absolute 07/25/2022 0.0  0.0 - 0.1 K/uL Final   Immature Granulocytes 07/25/2022 0  % Final   Abs Immature Granulocytes 07/25/2022 0.02  0.00 - 0.07 K/uL Final   Performed at [facility], [place] [address], [place], [place] [address]   Sodium 07/25/2022 138  135 - 145 mmol/L Final   Potassium 07/25/2022 4.0  3.5 - 5.1 mmol/L Final   Chloride 07/25/2022 104  98 - 111 mmol/L Final   CO2 07/25/2022 29  22 - 32 mmol/L Final   Glucose, Bld 07/25/2022 83  70 - 99 mg/dL Final   Glucose reference range applies only to samples  taken after fasting for at least 8 hours.   BUN 07/25/2022 11  6 - 20 mg/dL Final   Creatinine, Ser 07/25/2022 0.97  0.44 - 1.00 mg/dL Final   Calcium 07/25/2022 9.2  8.9 - 10.3 mg/dL Final   Total Protein 07/25/2022 7.0  6.5 - 8.1 g/dL Final   Albumin 07/25/2022 3.8  3.5 - 5.0 g/dL Final   AST 07/25/2022 18  15 - 41 U/L Final   ALT 07/25/2022 22  0 - 44 U/L Final   Alkaline Phosphatase 07/25/2022 64  38 - 126 U/L Final   Total Bilirubin 07/25/2022 0.2 (L)  0.3 - 1.2 mg/dL Final   GFR, Estimated 07/25/2022 >60  >60 mL/min Final   Comment: (NOTE) Calculated using the CKD-EPI Creatinine Equation (2021)    Anion gap 07/25/2022 5  5 - 15 Final   Performed at [place] [address] [place], [place] [address]   POC Amphetamine UR 07/25/2022 None Detected  NONE DETECTED (Cut Off Level 1000 ng/mL) Preliminary   POC Secobarbital (BAR) 07/25/2022 None Detected  NONE DETECTED (Cut Off Level 300 ng/mL) Preliminary   POC Buprenorphine (BUP) 07/25/2022 None Detected  NONE DETECTED (Cut Off Level 10 ng/mL) Preliminary   POC Oxazepam (BZO) 07/25/2022 None Detected  NONE DETECTED (Cut Off Level 300 ng/mL) Preliminary   POC Cocaine UR 07/25/2022 None Detected  NONE DETECTED (  Cut Off Level 300 ng/mL) Preliminary   POC Methamphetamine UR 07/25/2022 None Detected  NONE DETECTED (Cut Off Level 1000 ng/mL) Preliminary   POC Morphine 07/25/2022 None Detected  NONE DETECTED (Cut Off Level 300 ng/mL) Preliminary   POC Methadone UR 07/25/2022 None Detected  NONE DETECTED (Cut Off Level 300 ng/mL) Preliminary   POC Oxycodone UR 07/25/2022 None Detected  NONE DETECTED (Cut Off Level 100 ng/mL) Preliminary   POC Marijuana UR 07/25/2022 None Detected  NONE DETECTED (Cut Off Level 50 ng/mL) Preliminary   SARSCOV2ONAVIRUS 2 AG 07/25/2022 NEGATIVE  NEGATIVE Final   Comment: (NOTE) SARS-CoV-2 antigen NOT DETECTED.   Negative results are presumptive.  Negative results do not preclude SARS-CoV-2 infection and  should not be used as the sole basis for treatment or other patient management decisions, including infection  control decisions, particularly in the presence of clinical signs and  symptoms consistent with COVID-19, or in those who have been in contact with the virus.  Negative results must be combined with clinical observations, patient history, and epidemiological information. The expected result is Negative.  Fact Sheet for Patients: HandmadeRecipes.com.cy  Fact Sheet for Healthcare Providers: FuneralLife.at  This test is not yet approved or cleared by the [place] FDA and  has been authorized for detection and/or diagnosis of SARS-CoV-2 by FDA under an Emergency Use Authorization (EUA).  This EUA will remain in effect (meaning this test can be used) for the duration of  the COV                          ID-19 declaration under Section 564(b)(1) of the Act, 21 U.S.C. section 360bbb-3(b)(1), unless the authorization is terminated or revoked sooner.     Cholesterol 07/25/2022 181  0 - 200 mg/dL Final   Triglycerides 07/25/2022 118  <150 mg/dL Final   HDL 07/25/2022 45  >40 mg/dL Final   Total CHOL/HDL Ratio 07/25/2022 4.0  RATIO Final   VLDL 07/25/2022 24  0 - 40 mg/dL Final   LDL Cholesterol 07/25/2022 112 (H)  0 - 99 mg/dL Final   Comment:        Total Cholesterol/HDL:CHD Risk Coronary Heart Disease Risk Table                     Men   Women  1/2 Average Risk   3.4   3.3  Average Risk       5.0   4.4  2 X Average Risk   9.6   7.1  3 X Average Risk  23.4   11.0        Use the calculated Patient Ratio above and the CHD Risk Table to determine the patient's CHD Risk.        ATP III CLASSIFICATION (LDL):  <100     mg/dL   Optimal  100-129  mg/dL   Near or Above                    Optimal  130-159  mg/dL   Borderline  160-189  mg/dL   High  >190     mg/dL   Very High Performed at [facility] [address], [place], [place] [address]    TSH 07/25/2022 6.668 (H)  0.350 - 4.500 uIU/mL Final   Comment: Performed by a 3rd Generation assay with a functional sensitivity of <=0.01 uIU/mL. Performed at [facility], [place]  [address], [place], [place] [address]    Glucose-Capillary 07/26/2022 104 (H)  70 - 99 mg/dL Final   Glucose reference range applies only to samples taken after fasting for at least 8 hours.   T3, Free 07/31/2022 2.3  2.0 - 4.4 pg/mL Final   Comment: (NOTE) Performed At: [facility] [place], [place] [id number] Rush Farmer MD [id number]    Free T4 07/31/2022 0.60 (L)  0.61 - 1.12 ng/dL Final   Comment: (NOTE) Biotin ingestion may interfere with free T4 tests. If the results are inconsistent with the TSH level, previous test results, or the clinical presentation, then consider biotin interference. If needed, order repeat testing after stopping biotin. Performed at [facility], [place] [address] [place], [place] [address]   Admission on 07/04/2022, Discharged on 07/05/2022  Component Date Value Ref Range Status   Sodium 07/04/2022 142  135 - 145 mmol/L Final   Potassium 07/04/2022 4.4  3.5 - 5.1 mmol/L Final   Chloride 07/04/2022 109  98 - 111 mmol/L Final   CO2 07/04/2022 26  22 - 32 mmol/L Final   Glucose, Bld 07/04/2022 96  70 - 99 mg/dL Final   Glucose reference range applies only to samples taken after fasting for at least 8 hours.   BUN 07/04/2022 15  6 - 20 mg/dL Final   Creatinine, Ser 07/04/2022 0.83  0.44 - 1.00 mg/dL Final   Calcium 07/04/2022 9.3  8.9 - 10.3 mg/dL Final   Total Protein 07/04/2022 7.2  6.5 - 8.1 g/dL Final   Albumin 07/04/2022 3.9  3.5 - 5.0 g/dL Final   AST 07/04/2022 23  15 - 41 U/L Final   ALT 07/04/2022 28  0 - 44 U/L Final   Alkaline Phosphatase 07/04/2022 77  38 - 126 U/L Final   Total Bilirubin 07/04/2022 0.4  0.3 - 1.2 mg/dL Final   GFR, Estimated 07/04/2022 >60  >60 mL/min Final   Comment:  (NOTE) Calculated using the CKD-EPI Creatinine Equation (2021)    Anion gap 07/04/2022 7  5 - 15 Final   Performed at [facility], [place] [address], [place],  [address]   Alcohol, Ethyl (B) 07/04/2022 <10  <10 mg/dL Final   Comment: (NOTE) Lowest detectable limit for serum alcohol is 10 mg/dL.  For medical purposes only. Performed at [facility], [place] [address], [place], [place] [address]    WBC 07/04/2022 7.0  4.0 - 10.5 K/uL Final   RBC 07/04/2022 4.18  3.87 - 5.11 MIL/uL Final   Hemoglobin 07/04/2022 12.8  12.0 - 15.0 g/dL Final   HCT 07/04/2022 39.1  36.0 - 46.0 % Final   MCV 07/04/2022 93.5  80.0 - 100.0 fL Final   MCH 07/04/2022 30.6  26.0 - 34.0 pg Final   MCHC 07/04/2022 32.7  30.0 - 36.0 g/dL Final   RDW 07/04/2022 12.9  11.5 - 15.5 % Final   Platelets 07/04/2022 243  150 - 400 K/uL Final   nRBC 07/04/2022 0.0  0.0 - 0.2 % Final   Neutrophils Relative % 07/04/2022 43  % Final   Neutro Abs 07/04/2022 3.0  1.7 - 7.7 K/uL Final   Lymphocytes Relative 07/04/2022 50  % Final   Lymphs Abs 07/04/2022 3.5  0.7 - 4.0 K/uL Final   Monocytes Relative 07/04/2022 7  % Final   Monocytes Absolute 07/04/2022 0.5  0.1 - 1.0 K/uL Final   Eosinophils Relative 07/04/2022 0  % Final  Eosinophils Absolute 07/04/2022 0.0  0.0 - 0.5 K/uL Final   Basophils Relative 07/04/2022 0  % Final   Basophils Absolute 07/04/2022 0.0  0.0 - 0.1 K/uL Final   Immature Granulocytes 07/04/2022 0  % Final   Abs Immature Granulocytes 07/04/2022 0.01  0.00 - 0.07 K/uL Final   Performed at [facility], [place] [address], [place], [place] [address]   I-stat hCG, quantitative 07/04/2022 <5.0  <5 mIU/mL Final   Comment 3 07/04/2022          Final   Comment:   GEST. AGE      CONC.  (mIU/mL)   <=1 WEEK        5 - 50     2 WEEKS       50 - 500     3 WEEKS       100 - 10,000     4 WEEKS     1,000 - 30,000        FEMALE AND NON-PREGNANT FEMALE:     LESS  THAN 5 mIU/mL   Admission on 06/14/2022, Discharged on 06/14/2022  Component Date Value Ref Range Status   Color, UA 06/14/2022 yellow  yellow Final   Clarity, UA 06/14/2022 cloudy (A)  clear Final   Glucose, UA 06/14/2022 negative  negative mg/dL Final   Bilirubin, UA 06/14/2022 negative  negative Final   Ketones, POC UA 06/14/2022 negative  negative mg/dL Final   Spec Grav, UA 06/14/2022 1.025  1.010 - 1.025 Final   Blood, UA 06/14/2022 negative  negative Final   pH, UA 06/14/2022 7.5  5.0 - 8.0 Final   Protein Ur, POC 06/14/2022 =30 (A)  negative mg/dL Final   Urobilinogen, UA 06/14/2022 0.2  0.2 or 1.0 E.U./dL Final   Nitrite, UA 06/14/2022 Negative  Negative Final   Leukocytes, UA 06/14/2022 Negative  Negative Final   Preg Test, Ur 06/14/2022 Negative  Negative Final   Specimen Description 06/14/2022 URINE, CLEAN CATCH   Final   Special Requests 06/14/2022    Final                   Value:NONE Performed at [facility], [place] [address] [place], [place] [address]    Culture 06/14/2022 MULTIPLE SPECIES PRESENT, SUGGEST RECOLLECTION (A)   Final   Report Status 06/14/2022 06/16/2022 FINAL   Final  Admission on 06/07/2022, Discharged on 06/10/2022  Component Date Value Ref Range Status   SARS Coronavirus 2 by RT PCR 06/07/2022 NEGATIVE  NEGATIVE Final   Comment: (NOTE) SARS-CoV-2 target nucleic acids are NOT DETECTED.  The SARS-CoV-2 RNA is generally detectable in upper respiratory specimens during the acute phase of infection. The lowest concentration of SARS-CoV-2 viral copies this assay can detect is 138 copies/mL. A negative result does not preclude SARS-Cov-2 infection and should not be used as the sole basis for treatment or other patient management decisions. A negative result may occur with  improper specimen collection/handling, submission of specimen other than nasopharyngeal swab, presence of viral mutation(s) within the areas targeted by this assay, and  inadequate number of viral copies(<138 copies/mL). A negative result must be combined with clinical observations, patient history, and epidemiological information. The expected result is Negative.  Fact Sheet for Patients:  EntrepreneurPulse.com.au  Fact Sheet for Healthcare Providers:  IncredibleEmployment.be  This test is no  t yet approved or cleared by the [place] and  has been authorized for detection and/or diagnosis of SARS-CoV-2 by FDA under an Emergency Use Authorization (EUA). This EUA will remain  in effect (meaning this test can be used) for the duration of the COVID-19 declaration under Section 564(b)(1) of the Act, 21 U.S.C.section 360bbb-3(b)(1), unless the authorization is terminated  or revoked sooner.       Influenza A by PCR 06/07/2022 NEGATIVE  NEGATIVE Final   Influenza B by PCR 06/07/2022 NEGATIVE  NEGATIVE Final   Comment: (NOTE) The Xpert Xpress SARS-CoV-2/FLU/RSV plus assay is intended as an aid in the diagnosis of influenza from Nasopharyngeal swab specimens and should not be used as a sole basis for treatment. Nasal washings and aspirates are unacceptable for Xpert Xpress SARS-CoV-2/FLU/RSV testing.  Fact Sheet for Patients: EntrepreneurPulse.com.au  Fact Sheet for Healthcare Providers: IncredibleEmployment.be  This test is not yet approved or cleared by the [place] FDA and has been authorized for detection and/or diagnosis of SARS-CoV-2 by FDA under an Emergency Use Authorization (EUA). This EUA will remain in effect (meaning this test can be used) for the duration of the COVID-19 declaration under Section 564(b)(1) of the Act, 21 U.S.C. section 360bbb-3(b)(1), unless the authorization is terminated or revoked.  Performed at [facility], [place] [address] [place], [place] [address]    WBC 06/07/2022 5.7  4.0 - 10.5 K/uL Final    RBC 06/07/2022 4.39  3.87 - 5.11 MIL/uL Final   Hemoglobin 06/07/2022 13.2  12.0 - 15.0 g/dL Final   HCT 06/07/2022 40.4  36.0 - 46.0 % Final   MCV 06/07/2022 92.0  80.0 - 100.0 fL Final   MCH 06/07/2022 30.1  26.0 - 34.0 pg Final   MCHC 06/07/2022 32.7  30.0 - 36.0 g/dL Final   RDW 06/07/2022 12.4  11.5 - 15.5 % Final   Platelets 06/07/2022 308  150 - 400 K/uL Final   nRBC 06/07/2022 0.0  0.0 - 0.2 % Final   Neutrophils Relative % 06/07/2022 42  % Final   Neutro Abs 06/07/2022 2.4  1.7 - 7.7 K/uL Final   Lymphocytes Relative 06/07/2022 54  % Final   Lymphs Abs 06/07/2022 3.1  0.7 - 4.0 K/uL Final   Monocytes Relative 06/07/2022 4  % Final   Monocytes Absolute 06/07/2022 0.2  0.1 - 1.0 K/uL Final   Eosinophils Relative 06/07/2022 0  % Final   Eosinophils Absolute 06/07/2022 0.0  0.0 - 0.5 K/uL Final   Basophils Relative 06/07/2022 0  % Final   Basophils Absolute 06/07/2022 0.0  0.0 - 0.1 K/uL Final   Immature Granulocytes 06/07/2022 0  % Final   Abs Immature Granulocytes 06/07/2022 0.01  0.00 - 0.07 K/uL Final   Performed at [facility], [place] [address], [place], [place] [address]   Sodium 06/07/2022 139  135 - 145 mmol/L Final   Potassium 06/07/2022 4.0  3.5 - 5.1 mmol/L Final   Chloride 06/07/2022 104  98 - 111 mmol/L Final   CO2 06/07/2022 28  22 - 32 mmol/L Final   Glucose, Bld 06/07/2022 104 (H)  70 - 99 mg/dL Final   Glucose reference range applies only to samples taken after fasting for at least 8 hours.   BUN 06/07/2022 8  6 - 20 mg/dL Final   Creatinine, Ser 06/07/2022 0.84  0.44 - 1.00 mg/dL Final   Calcium 06/07/2022 9.1  8.9 - 10.3 mg/dL Final   Total  Protein 06/07/2022 6.9  6.5 - 8.1 g/dL Final   Albumin 06/07/2022 3.7  3.5 - 5.0 g/dL Final   AST 06/07/2022 19  15 - 41 U/L Final   ALT 06/07/2022 22  0 - 44 U/L Final   Alkaline Phosphatase 06/07/2022 54  38 - 126 U/L Final   Total Bilirubin 06/07/2022 0.4  0.3 - 1.2 mg/dL Final   GFR, Estimated 06/07/2022 >60   >60 mL/min Final   Comment: (NOTE) Calculated using the CKD-EPI Creatinine Equation (2021)    Anion gap 06/07/2022 7  5 - 15 Final   Performed at [facility], [place] [address], [place], [place] [address]   Hgb A1c MFr Bld 06/07/2022 5.0  4.8 - 5.6 % Final   Comment: (NOTE) Pre diabetes:          5.7%-6.4%  Diabetes:              >6.4%  Glycemic control for   <7.0% adults with diabetes    Mean Plasma Glucose 06/07/2022 96.8  mg/dL Final   Performed at [facility], [place] [address], [place], [place] [address]   TSH 06/07/2022 1.620  0.350 - 4.500 uIU/mL Final   Comment: Performed by a 3rd Generation assay with a functional sensitivity of <=0.01 uIU/mL. Performed at [facility], [place] [address], [place], [place] [address]    RPR Ser Ql 06/07/2022 NON REACTIVE  NON REACTIVE Final   Performed at [facility], [place] [address], [place], [place] [address]   Color, Urine 06/07/2022 YELLOW  YELLOW Final   APPearance 06/07/2022 HAZY (A)  CLEAR Final   Specific Gravity, Urine 06/07/2022 1.018  1.005 - 1.030 Final   pH 06/07/2022 7.0  5.0 - 8.0 Final   Glucose, UA 06/07/2022 NEGATIVE  NEGATIVE mg/dL Final   Hgb urine dipstick 06/07/2022 NEGATIVE  NEGATIVE Final   Bilirubin Urine 06/07/2022 NEGATIVE  NEGATIVE Final   Ketones, ur 06/07/2022 NEGATIVE  NEGATIVE mg/dL Final   Protein, ur 06/07/2022 NEGATIVE  NEGATIVE mg/dL Final   Nitrite 06/07/2022 NEGATIVE  NEGATIVE Final   Leukocytes,Ua 06/07/2022 NEGATIVE  NEGATIVE Final   Performed at [facility], [place] [address], [place], [place] [address]   Cholesterol 06/07/2022 164  0 - 200 mg/dL Final   Triglycerides 06/07/2022 158 (H)  <150 mg/dL Final   HDL 06/07/2022 44  >40 mg/dL Final   Total CHOL/HDL Ratio 06/07/2022 3.7  RATIO Final   VLDL 06/07/2022 32  0 - 40 mg/dL Final   LDL Cholesterol 06/07/2022 88  0 - 99 mg/dL Final   Comment:        Total Cholesterol/HDL:CHD Risk Coronary Heart Disease Risk Table                      Men   Women  1/2 Average Risk   3.4   3.3  Average Risk       5.0   4.4  2 X Average Risk   9.6   7.1  3 X Average Risk  23.4   11.0        Use the calculated Patient Ratio above and the CHD Risk Table to determine the patient's CHD Risk.        ATP III CLASSIFICATION (LDL):  <100     mg/dL   Optimal  100-129  mg/dL   Near or Above  Optimal  130-159  mg/dL   Borderline  160-189  mg/dL   High  >190     mg/dL   Very High Performed at [facility] [address] [place], [place] [address]    HIV Screen 4th Generation wRfx 06/07/2022 Non Reactive  Non Reactive Final   Performed at [facility], [place] [address], [place], [place] [address]   SARSCOV2ONAVIRUS 2 AG 06/07/2022 NEGATIVE  NEGATIVE Final   Comment: (NOTE) SARS-CoV-2 antigen NOT DETECTED.   Negative results are presumptive.  Negative results do not preclude SARS-CoV-2 infection and should not be used as the sole basis for treatment or other patient management decisions, including infection  control decisions, particularly in the presence of clinical signs and  symptoms consistent with COVID-19, or in those who have been in contact with the virus.  Negative results must be combined with clinical observations, patient history, and epidemiological information. The expected result is Negative.  Fact Sheet for Patients: HandmadeRecipes.com.cy  Fact Sheet for Healthcare Providers: FuneralLife.at  This test is not yet approved or cleared by the [place] FDA and  has been authorized for detection and/or diagnosis of SARS-CoV-2 by FDA under an Emergency Use Authorization (EUA).  This EUA will remain in effect (meaning this test can be used) for the duration of  the COV                          ID-19 declaration under Section 564(b)(1) of the Act, 21 U.S.C. section 360bbb-3(b)(1), unless the authorization is terminated or revoked  sooner.     POC Amphetamine UR 06/07/2022 None Detected  NONE DETECTED (Cut Off Level 1000 ng/mL) Final   POC Secobarbital (BAR) 06/07/2022 None Detected  NONE DETECTED (Cut Off Level 300 ng/mL) Final   POC Buprenorphine (BUP) 06/07/2022 None Detected  NONE DETECTED (Cut Off Level 10 ng/mL) Final   POC Oxazepam (BZO) 06/07/2022 None Detected  NONE DETECTED (Cut Off Level 300 ng/mL) Final   POC Cocaine UR 06/07/2022 None Detected  NONE DETECTED (Cut Off Level 300 ng/mL) Final   POC Methamphetamine UR 06/07/2022 None Detected  NONE DETECTED (Cut Off Level 1000 ng/mL) Final   POC Morphine 06/07/2022 None Detected  NONE DETECTED (Cut Off Level 300 ng/mL) Final   POC Methadone UR 06/07/2022 None Detected  NONE DETECTED (Cut Off Level 300 ng/mL) Final   POC Oxycodone UR 06/07/2022 None Detected  NONE DETECTED (Cut Off Level 100 ng/mL) Final   POC Marijuana UR 06/07/2022 None Detected  NONE DETECTED (Cut Off Level 50 ng/mL) Final   Preg Test, Ur 06/08/2022 NEGATIVE  NEGATIVE Final   Comment:        THE SENSITIVITY OF THIS METHODOLOGY IS >20 mIU/mL. Performed at [facility], [place] [address] [place], [place] [address]    Preg Test, Ur 06/08/2022 NEGATIVE  NEGATIVE Final   Comment:        THE SENSITIVITY OF THIS METHODOLOGY IS >24 mIU/mL   Admission on 02/07/2022, Discharged on 02/08/2022  Component Date Value Ref Range Status   SARS Coronavirus 2 by RT PCR 02/07/2022 NEGATIVE  NEGATIVE Final   Comment: (NOTE) SARS-CoV-2 target nucleic acids are NOT DETECTED.  The SARS-CoV-2 RNA is generally detectable in upper respiratory specimens during the acute phase of infection. The lowest concentration of SARS-CoV-2 viral copies this assay can detect is 138 copies/mL. A negative result does not preclude SARS-Cov-2 infection and should not be  used as the sole basis for treatment or other patient management decisions. A negative result may occur with  improper specimen collection/handling,  submission of specimen other than nasopharyngeal swab, presence of viral mutation(s) within the areas targeted by this assay, and inadequate number of viral copies(<138 copies/mL). A negative result must be combined with clinical observations, patient history, and epidemiological information. The expected result is Negative.  Fact Sheet for Patients:  EntrepreneurPulse.com.au  Fact Sheet for Healthcare Providers:  IncredibleEmployment.be  This test is no                          t yet approved or cleared by the [place] FDA and  has been authorized for detection and/or diagnosis of SARS-CoV-2 by FDA under an Emergency Use Authorization (EUA). This EUA will remain  in effect (meaning this test can be used) for the duration of the COVID-19 declaration under Section 564(b)(1) of the Act, 21 U.S.C.section 360bbb-3(b)(1), unless the authorization is terminated  or revoked sooner.       Influenza A by PCR 02/07/2022 NEGATIVE  NEGATIVE Final   Influenza B by PCR 02/07/2022 NEGATIVE  NEGATIVE Final   Comment: (NOTE) The Xpert Xpress SARS-CoV-2/FLU/RSV plus assay is intended as an aid in the diagnosis of influenza from Nasopharyngeal swab specimens and should not be used as a sole basis for treatment. Nasal washings and aspirates are unacceptable for Xpert Xpress SARS-CoV-2/FLU/RSV testing.  Fact Sheet for Patients: EntrepreneurPulse.com.au  Fact Sheet for Healthcare Providers: IncredibleEmployment.be  This test is not yet approved or cleared by the [place] FDA and has been authorized for detection and/or diagnosis of SARS-CoV-2 by FDA under an Emergency Use Authorization (EUA). This EUA will remain in effect (meaning this test can be used) for the duration of the COVID-19 declaration under Section 564(b)(1) of the Act, 21 U.S.C. section 360bbb-3(b)(1), unless the authorization is terminated  or revoked.  Performed at [facility], [place] [address] [place], [place] [address]    WBC 02/07/2022 8.1  4.0 - 10.5 K/uL Final   RBC 02/07/2022 4.55  3.87 - 5.11 MIL/uL Final   Hemoglobin 02/07/2022 13.6  12.0 - 15.0 g/dL Final   HCT 02/07/2022 41.9  36.0 - 46.0 % Final   MCV 02/07/2022 92.1  80.0 - 100.0 fL Final   MCH 02/07/2022 29.9  26.0 - 34.0 pg Final   MCHC 02/07/2022 32.5  30.0 - 36.0 g/dL Final   RDW 02/07/2022 12.9  11.5 - 15.5 % Final   Platelets 02/07/2022 286  150 - 400 K/uL Final   nRBC 02/07/2022 0.0  0.0 - 0.2 % Final   Neutrophils Relative % 02/07/2022 41  % Final   Neutro Abs 02/07/2022 3.3  1.7 - 7.7 K/uL Final   Lymphocytes Relative 02/07/2022 53  % Final   Lymphs Abs 02/07/2022 4.3 (H)  0.7 - 4.0 K/uL Final   Monocytes Relative 02/07/2022 6  % Final   Monocytes Absolute 02/07/2022 0.5  0.1 - 1.0 K/uL Final   Eosinophils Relative 02/07/2022 0  % Final   Eosinophils Absolute 02/07/2022 0.0  0.0 - 0.5 K/uL Final   Basophils Relative 02/07/2022 0  % Final   Basophils Absolute 02/07/2022 0.0  0.0 - 0.1 K/uL Final   Immature Granulocytes 02/07/2022 0  % Final   Abs Immature Granulocytes 02/07/2022 0.02  0.00 - 0.07 K/uL Final   Performed at [facility], [place],  [place] [address]   Sodium 02/07/2022 137  135 - 145 mmol/L Final   Potassium 02/07/2022 4.1  3.5 - 5.1 mmol/L Final   Chloride 02/07/2022 104  98 - 111 mmol/L Final   CO2 02/07/2022 29  22 - 32 mmol/L Final   Glucose, Bld 02/07/2022 92  70 - 99 mg/dL Final   Glucose reference range applies only to samples taken after fasting for at least 8 hours.   BUN 02/07/2022 10  6 - 20 mg/dL Final   Creatinine, Ser 02/07/2022 0.88  0.44 - 1.00 mg/dL Final   Calcium 02/07/2022 9.4  8.9 - 10.3 mg/dL Final   Total Protein 02/07/2022 7.2  6.5 - 8.1 g/dL Final   Albumin 02/07/2022 3.9  3.5 - 5.0 g/dL Final   AST 02/07/2022 18  15 - 41 U/L Final   ALT 02/07/2022 25  0 - 44 U/L Final    Alkaline Phosphatase 02/07/2022 66  38 - 126 U/L Final   Total Bilirubin 02/07/2022 0.2 (L)  0.3 - 1.2 mg/dL Final   GFR, Estimated 02/07/2022 >60  >60 mL/min Final   Comment: (NOTE) Calculated using the CKD-EPI Creatinine Equation (2021)    Anion gap 02/07/2022 4 (L)  5 - 15 Final   Performed at [facility] [address] [place], [place] [address]   Hgb A1c MFr Bld 02/07/2022 5.0  4.8 - 5.6 % Final   Comment: (NOTE) Pre diabetes:          5.7%-6.4%  Diabetes:              >6.4%  Glycemic control for   <7.0% adults with diabetes    Mean Plasma Glucose 02/07/2022 96.8  mg/dL Final   Performed at [facility] [address], [place], [place] [address]   Alcohol, Ethyl (B) 02/07/2022 <10  <10 mg/dL Final   Comment: (NOTE) Lowest detectable limit for serum alcohol is 10 mg/dL.  For medical purposes only. Performed at [facility], [place] [address] [place],  [address]    Cholesterol 02/07/2022 192  0 - 200 mg/dL Final   Triglycerides 02/07/2022 192 (H)  <150 mg/dL Final   HDL 02/07/2022 46  >40 mg/dL Final   Total CHOL/HDL Ratio 02/07/2022 4.2  RATIO Final   VLDL 02/07/2022 38  0 - 40 mg/dL Final   LDL Cholesterol 02/07/2022 108 (H)  0 - 99 mg/dL Final   Comment:        Total Cholesterol/HDL:CHD Risk Coronary Heart Disease Risk Table                     Men   Women  1/2 Average Risk   3.4   3.3  Average Risk       5.0   4.4  2 X Average Risk   9.6   7.1  3 X Average Risk  23.4   11.0        Use the calculated Patient Ratio above and the CHD Risk Table to determine the patient's CHD Risk.        ATP III CLASSIFICATION (LDL):  <100     mg/dL   Optimal  100-129  mg/dL   Near or Above                    Optimal  130-159  mg/dL   Borderline  160-189  mg/dL   High  >190     mg/dL  Very High Performed at [facility], [place] [address] [place], [place] [address]    TSH 02/07/2022 6.344 (H)  0.350 - 4.500 uIU/mL Final   Comment: Performed  by a 3rd Generation assay with a functional sensitivity of <=0.01 uIU/mL. Performed at [facility], [place] [address] [place], [place] [address]    POC Amphetamine UR 02/07/2022 None Detected  NONE DETECTED (Cut Off Level 1000 ng/mL) Final   POC Secobarbital (BAR) 02/07/2022 None Detected  NONE DETECTED (Cut Off Level 300 ng/mL) Final   POC Buprenorphine (BUP) 02/07/2022 None Detected  NONE DETECTED (Cut Off Level 10 ng/mL) Final   POC Oxazepam (BZO) 02/07/2022 None Detected  NONE DETECTED (Cut Off Level 300 ng/mL) Final   POC Cocaine UR 02/07/2022 None Detected  NONE DETECTED (Cut Off Level 300 ng/mL) Final   POC Methamphetamine UR 02/07/2022 None Detected  NONE DETECTED (Cut Off Level 1000 ng/mL) Final   POC Morphine 02/07/2022 None Detected  NONE DETECTED (Cut Off Level 300 ng/mL) Final   POC Oxycodone UR 02/07/2022 None Detected  NONE DETECTED (Cut Off Level 100 ng/mL) Final   POC Methadone UR 02/07/2022 None Detected  NONE DETECTED (Cut Off Level 300 ng/mL) Final   POC Marijuana UR 02/07/2022 None Detected  NONE DETECTED (Cut Off Level 50 ng/mL) Final   SARSCOV2ONAVIRUS 2 AG 02/07/2022 NEGATIVE  NEGATIVE Final   Comment: (NOTE) SARS-CoV-2 antigen NOT DETECTED.   Negative results are presumptive.  Negative results do not preclude SARS-CoV-2 infection and should not be used as the sole basis for treatment or other patient management decisions, including infection  control decisions, particularly in the presence of clinical signs and  symptoms consistent with COVID-19, or in those who have been in contact with the virus.  Negative results must be combined with clinical observations, patient history, and epidemiological information. The expected result is Negative.  Fact Sheet for Patients: HandmadeRecipes.com.cy  Fact Sheet for Healthcare Providers: FuneralLife.at  This test is not yet approved or cleared by the [place] FDA and   has been authorized for detection and/or diagnosis of SARS-CoV-2 by FDA under an Emergency Use Authorization (EUA).  This EUA will remain in effect (meaning this test can be used) for the duration of  the COV                          ID-19 declaration under Section 564(b)(1) of the Act, 21 U.S.C. section 360bbb-3(b)(1), unless the authorization is terminated or revoked sooner.     Preg Test, Ur 02/07/2022 NEGATIVE  NEGATIVE Final   Comment:        THE SENSITIVITY OF THIS METHODOLOGY IS >24 mIU/mL     Blood Alcohol level:  Lab Results  Component Value Date   ETH <10 07/04/2022   ETH <10 [id number]    Metabolic Disorder Labs: Lab Results  Component Value Date   HGBA1C 5.0 06/07/2022   MPG 96.8 06/07/2022   MPG 96.8 02/07/2022   No results found for: "PROLACTIN" Lab Results  Component Value Date   CHOL 181 07/25/2022   TRIG 118 07/25/2022   HDL 45 07/25/2022   CHOLHDL 4.0 07/25/2022   VLDL 24 07/25/2022   LDLCALC 112 (H) 07/25/2022   LDLCALC 88 06/07/2022    Therapeutic Lab Levels: No results found for: "LITHIUM" No results found for: "VALPROATE" No results found for: "CBMZ"  Physical Findings   GAD-7    [place] Office Visit  from 12/17/2021 in [place]  Total GAD-7 Score 17      PHQ2-9    [facility] from 06/07/2022 in [facility] Visit from 12/17/2021 in [facility]  PHQ-2 Total Score 1 2  PHQ-9 Total Score 2 8      Flowsheet [place] [facility] from 07/25/2022 in [facility] ED from 07/04/2022 in [facility] DEPT ED from 06/14/2022 in [facility] High Risk No Risk No Risk        Musculoskeletal  Strength & Muscle Tone: within normal limits Gait & Station: normal Patient leans: N/A  Psychiatric Specialty Exam  Presentation  General Appearance:   Appropriate for Environment  Eye Contact: Good  Speech: Clear and Coherent; Normal Rate  Speech Volume: Normal  Handedness: Right   Mood and Affect  Mood: Euthymic  Affect: Appropriate; Congruent; Full Range   Thought Process  Thought Processes: Coherent; Goal Directed; Linear  Descriptions of Associations:Intact  Orientation:Full (Time, Place and Person)  Thought Content:Logical; WDL  Diagnosis of Schizophrenia or Schizoaffective disorder in past: No    Hallucinations:Denied AVH   Ideas of Reference:None  Suicidal Thoughts:Suicidal Thoughts: No (Contracted to safety.  Last SI or HI was on admission.) SI Active Intent and/or Plan:  (Denied)  Homicidal Thoughts:Homicidal Thoughts: No HI Active Intent and/or Plan:  (Denied) HI Passive Intent and/or Plan:  (Denied)    Sensorium  Memory: Immediate Good; Recent Good  Judgment: Fair  Insight: Fair   Executive Functions  Concentration: Good  Attention Span: Good  Recall: Good  Fund of Knowledge: Fair  Language: Good   Psychomotor Activity  Psychomotor Activity: Normal    Assets  Assets: Communication Skills; Desire for Improvement; Leisure Time; Social Support   Sleep  Sleep: Good   Physical Exam  Physical Exam Vitals and nursing note reviewed.  Constitutional:      General: She is not in acute distress.    Appearance: She is not ill-appearing or diaphoretic.  HENT:     Head: Normocephalic and atraumatic.  Pulmonary:     Effort: Pulmonary effort is normal. No respiratory distress.  Neurological:     General: No focal deficit present.     Mental Status: She is alert.     Blood pressure 108/74, pulse 65, temperature 98.1 F (36.7 C), temperature source Oral, resp. rate 16, SpO2 99 %. There is no height or weight on file to calculate BMI.  Treatment Plan Summary: [facility] team continues to seek placement for patient.   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, recurrent herpes infection, GERD, hypothyroidism  Current sch rx:  ARIPiprazole ER, 400 mg, Q28 days clotrimazole, , BID levothyroxine, 100 mcg, Q0600 nicotine, 14 mg, Q0600 ondansetron, 4 mg, Once Oxcarbazepine, 300 mg, BID pantoprazole, 20 mg, Daily QUEtiapine, 400 mg, BID senna-docusate, 1 tablet, BID sertraline, 150 mg, Daily valACYclovir, 500 mg, Daily    Dispo Tentative date: Unsure Barrier: Housing Received proof of guardianship  Pending fax of release of information from guardian  Patient continues to be psychiatric stable and ready for dispo pending placement availability  Signed: Merrily Brittle, DO Psychiatry Resident, PGY-2 08/06/2022, 10:30 AM  [place] [address] Dept: [phone] Dept Fax: [phone]

## 2022-08-06 NOTE — Care Management (Signed)
Care Management   Writer received a copy of the Letter Of Guardianship paperwork from the APS worker Rushie Chestnut).   Writer placed the Letter of Guardianship form in the bin so that it can be scanned into epic. Marland Kitchen

## 2022-08-06 NOTE — Care Management (Signed)
Care Management   Writer contacted the APS worker Rushie Chestnut [phone].   Per Denton Ar, the patient was staffed today and APS has determined that they will be assisted in finding the patient placement in a Group Home.   Per Denton Ar, she is in the process of having the guardianship switched from the mother Maxxine to the father Gwenlyn Perking.   Per Denton Ar, she will be able to give me daily updates on the progress towards obtaining placement for the patient.

## 2022-08-06 NOTE — ED Notes (Signed)
Pt sleeping at present, no distress noted.  Monitoring for safety. 

## 2022-08-07 ENCOUNTER — Encounter (HOSPITAL_COMMUNITY): Payer: Self-pay | Admitting: Student

## 2022-08-07 DIAGNOSIS — E039 Hypothyroidism, unspecified: Secondary | ICD-10-CM

## 2022-08-07 DIAGNOSIS — F17200 Nicotine dependence, unspecified, uncomplicated: Secondary | ICD-10-CM

## 2022-08-07 HISTORY — DX: Nicotine dependence, unspecified, uncomplicated: F17.200

## 2022-08-07 HISTORY — DX: Hypothyroidism, unspecified: E03.9

## 2022-08-07 MED ORDER — SENNOSIDES-DOCUSATE SODIUM 8.6-50 MG PO TABS
1.0000 | ORAL_TABLET | Freq: Every day | ORAL | Status: DC
  Administered 2022-08-08 – 2022-09-04 (×28): 1 via ORAL
  Filled 2022-08-07 (×28): qty 1

## 2022-08-07 NOTE — Care Management (Signed)
Care Management   Writer left a voicemail message regarding an update on the status of placement for the patient with the APS worker Rushie Chestnut [phone])

## 2022-08-07 NOTE — Progress Notes (Signed)
APS worker:   Rushie Chestnut [phone]) and email: [email or web address]

## 2022-08-07 NOTE — Progress Notes (Signed)
[facility] called APS worker to discuss updates.  APS worker she concluded her investigation for the report on 08/06/2022.  APS worker reports during the investigation phase they do no research/locate services for placements.  APS worker reports they have 30-45 days to complete a report and from there they start allocating services.    APS worker reports she is need of the care coordinator phone number from [facility] to see who the client previously worked with and what has already been completed.  APS worker reports she reached out to the previous group home owner and has requested a full copy of her medical records/ documents but has not heard back.  [facility] will also call the group home owner tonight to see if she can get that information.  APS Worker reports the client will need a psychological evaluation and a FL2.  [facility] informed her the provider her can do a FL2.  APS Worker asked that the provider wait to complete the FL2 because it's only good for 30 days.    APS worker reports Scientist, research (medical) should be able to provide a list of available group homes for the client to go to and that will take her insurance.    [facility] will also reach out to [facility] for any additional information they can provide regarding the client.    Drema Balzarine  [facility]

## 2022-08-07 NOTE — ED Notes (Signed)
Pt sleeping at present, no distress noted.  Monitoring for safety. 

## 2022-08-07 NOTE — Progress Notes (Signed)
[facility] typed the following email and sent it to Rushie Chestnut, [name] Johnnye Sima, and Kingsley Callander:   "Good Evening,   Nicole [name] ([id number]) is a client here at the [facility]).  [facility] reports [name] is her Care Coordinator and she can assist with finding a new group home for the client.  Wells Guiles, I have [place] in the email Rushie Chestnut ([place]) and [name] will be assisting with the case during the day since I'm the evening social worker at the [facility] .  My ext in the evening is:[phone], however I do not arrive until 4:30pm so please call and ask to be transferred to [name] at [phone].    [name] has been in our locked facility for over two weeks, and we need assistance with finding her a new group home.  Please let us know what information needs to be completed to get her transferred to a new group home.  Our providers here are more than willing to assist with any requests to help get her permanent housing in a group home.    Thank you,  [name]"

## 2022-08-07 NOTE — Progress Notes (Signed)
[facility] called [facility] at [phone] to assist with finding out who her care coordinator is and what information they have to help find new placement.    Assigned to care coordinator Kingsley Callander: [phone]  [email or web address] .Raoul Pitch report her care coordinator can assist with finding placement and providing [place] with documentation as needed.   Drema Balzarine  [facility]

## 2022-08-07 NOTE — ED Provider Notes (Signed)
Behavioral Health Progress Note  Date and Time: 08/07/2022 10:15 AM Name: Nicole [name] MRN:  [id number]   HPI: Nicole [name] is a 31 y.o. female, with PMH of SCZ unspecified, MDD with psychosis, who presented voluntary to [facility] (07/25/2022) via [facility] after a verbal altercation with Nicole [name] (not patient's legal guardian) for transient SI.  This is patient's fourth visit to [facility] for similar concerns this year. Patient has been dismissed from previous group home due to threatening SI and HI, then leaving the group home.   Legal Guardian: Nicole [name] (Nicole [name]) Point of contact: Nicole [name]) APS is following.   Subjective:  Patient was initially seen laying down in bed awake, comfortable, no acute distress.  Reported that she has not had another "burning sensation."  She reported that her appetite is "okay" and had a muffin, coffee and yogurt for breakfast. She woke up "a couple of times" last night.  She reports that she had a smooth BM "that was embarrassing frankly" yesterday and would like the Senokot once daily at night now.  She would like to talk to CSW today. Denies SI, HI, AVH. No other concerns or questions.  WU:[id number] Thoughts: No (Contracted to safety.  Last SI or HI was on admission.) SI Active Intent and/or Plan:  (Denied) WG:[id number] Thoughts: No HI Active Intent and/or Plan:  (Denied) HI Passive Intent and/or Plan:  (Denied) VHQ:[id number]: None Description of Auditory Hallucinations: Denied Description of Visual Hallucinations: Denied  Mood: Euthymic Sleep:Good (No nightmares) Appetite: Good  Review of Systems  Respiratory:  Negative for shortness of breath.   Cardiovascular:  Negative for chest pain.  Gastrointestinal:  Negative for nausea and vomiting.  Neurological:  Negative for dizziness and headaches.    Diagnosis:  Final diagnoses:  Severe episode of recurrent major depressive disorder, with psychotic features (HCC)    Total Time spent with  patient: 20 minutes  Past Psychiatric History: schizophrenia, schizoaffective disorder, MDD, suicidal ideation Past Medical History:  Past Medical History:  Diagnosis Date   Anxiety    Depression     Past Surgical History:  Procedure Laterality Date   WISDOM TOOTH EXTRACTION Bilateral 2020   Family History:  Family History  Problem Relation Age of Onset   Hypertension Father    Diabetes Father    Family Psychiatric  History: none reported Social History:  Social History   Substance and Sexual Activity  Alcohol Use Never     Social History   Substance and Sexual Activity  Drug Use Never    Social History   Socioeconomic History   Marital status: Single    Spouse name: Not on file   Number of children: Not on file   Years of education: Not on file   Highest education level: Not on file  Occupational History   Not on file  Tobacco Use   Smoking status: Every Day    Types: Cigarettes   Smokeless tobacco: Never  Vaping Use   Vaping Use: Some days  Substance and Sexual Activity   Alcohol use: Never   Drug use: Never   Sexual activity: Yes    Partners: Female    Birth control/protection: Implant  Other Topics Concern   Not on file  Social History Narrative   Not on file   Social Determinants of Health   Financial Resource Strain: Not on file  Food Insecurity: Not on file  Transportation Needs: Not on file  Physical Activity: Not on file  Stress: Not on file  Social Connections: Not on file   SDOH:  SDOH Screenings   Depression (PHQ2-9): Low Risk  (06/10/2022)  Tobacco Use: High Risk (08/03/2022)   Additional Social History:    Pain Medications: SEE MAR Prescriptions: SEE MAR Over the Counter: SEE MAR History of alcohol / drug use?: Yes Longest period of sobriety (when/how long): N/A Negative Consequences of Use:  (NONE) Withdrawal Symptoms: None Name of Substance 1: ETOH IN PAST--PT CURRENTLY DENIES 1 - Frequency: SOCIAL USE ETOH IN PAST 1 -  Method of Aquiring: LEGAL 1- Route of Use: ORAL DRINK Name of Substance 2: NICOTINE-VAPE AND CIGARETTES 2 - Amount (size/oz): LESS THAN ONE PACK 2 - Frequency: DAILY 2 - Method of Aquiring: LEGAL 2 - Route of Substance Use: ORAL SMOKE                Current Medications:  Current Facility-Administered Medications  Medication Dose Route Frequency Provider Last Rate Last Admin   acetaminophen (TYLENOL) tablet 650 mg  650 mg Oral Q6H PRN Onuoha, Chinwendu V, NP   650 mg at 07/31/22 1328   alum & mag hydroxide-simeth (MAALOX/MYLANTA) 200-200-20 MG/5ML suspension 30 mL  30 mL Oral Q4H PRN Onuoha, Chinwendu V, NP   30 mL at 07/27/22 2151   ARIPiprazole ER (ABILIFY MAINTENA) injection 400 mg  400 mg Intramuscular Q28 days Princess Bruins, DO   400 mg at 08/02/22 1154   clotrimazole (LOTRIMIN) 1 % cream   Topical BID Bobbitt, Shalon E, NP   Given at 08/07/22 0900   hydrOXYzine (ATARAX) tablet 25 mg  25 mg Oral TID PRN Onuoha, Chinwendu V, NP   25 mg at 08/06/22 1610   levothyroxine (SYNTHROID) tablet 100 mcg  100 mcg Oral Q0600 Princess Bruins, DO   100 mcg at 08/07/22 0617   magnesium hydroxide (MILK OF MAGNESIA) suspension 30 mL  30 mL Oral Daily PRN Onuoha, Chinwendu V, NP   30 mL at 08/02/22 1956   nicotine (NICODERM CQ - dosed in mg/24 hours) patch 14 mg  14 mg Transdermal Q0600 Onuoha, Chinwendu V, NP   14 mg at 08/07/22 0900   ondansetron (ZOFRAN) tablet 4 mg  4 mg Oral Once Nwoko, Uchenna E, PA       Oxcarbazepine (TRILEPTAL) tablet 300 mg  300 mg Oral BID Onuoha, Chinwendu V, NP   300 mg at 08/07/22 0901   pantoprazole (PROTONIX) EC tablet 20 mg  20 mg Oral Daily Princess Bruins, DO   20 mg at 08/07/22 0901   QUEtiapine (SEROQUEL) tablet 400 mg  400 mg Oral BID Onuoha, Chinwendu V, NP   400 mg at 08/07/22 0901   [START ON 08/08/2022] senna-docusate (Senokot-S) tablet 1 tablet  1 tablet Oral QHS Princess Bruins, DO       sertraline (ZOLOFT) tablet 150 mg  150 mg Oral Daily Onuoha, Chinwendu V,  NP   150 mg at 08/07/22 0902   traZODone (DESYREL) tablet 50 mg  50 mg Oral QHS PRN Onuoha, Chinwendu V, NP   50 mg at 08/06/22 2121   valACYclovir (VALTREX) tablet 500 mg  500 mg Oral Daily Princess Bruins, DO   500 mg at 08/07/22 0902   Current Outpatient Medications  Medication Sig Dispense Refill   ABILIFY MAINTENA 400 MG PRSY prefilled syringe 400 mg every 28 (twenty-eight) days.     cetirizine (ZYRTEC) 10 MG tablet Take 10 mg by mouth daily.     cyclobenzaprine (FLEXERIL) 10 MG tablet Take 1 tablet (10 mg total) by  mouth 2 (two) times daily as needed for muscle spasms. 20 tablet 0   fluticasone (FLONASE) 50 MCG/ACT nasal spray Place 1 spray into both nostrils daily.     hydrOXYzine (ATARAX) 50 MG tablet Take 1 tablet (50 mg total) by mouth 2 (two) times daily. 60 tablet 0   meloxicam (MOBIC) 15 MG tablet Take 15 mg by mouth daily.     Oxcarbazepine (TRILEPTAL) 300 MG tablet Take 1 tablet (300 mg total) by mouth 2 (two) times daily. 60 tablet 0   QUEtiapine (SEROQUEL) 400 MG tablet Take 1 tablet (400 mg total) by mouth 2 (two) times daily. 60 tablet 0   sertraline (ZOLOFT) 50 MG tablet Take 3 tablets (150 mg total) by mouth in the morning. 90 tablet 0   traZODone (DESYREL) 100 MG tablet Take 1 tablet (100 mg total) by mouth at bedtime. 30 tablet 0   valACYclovir (VALTREX) 500 MG tablet Take 500 mg by mouth daily.      Labs  Lab Results:  Admission on 07/25/2022  Component Date Value Ref Range Status   SARS Coronavirus 2 by RT PCR 07/25/2022 NEGATIVE  NEGATIVE Final   Comment: (NOTE) SARS-CoV-2 target nucleic acids are NOT DETECTED.  The SARS-CoV-2 RNA is generally detectable in upper respiratory specimens during the acute phase of infection. The lowest concentration of SARS-CoV-2 viral copies this assay can detect is 138 copies/mL. A negative result does not preclude SARS-Cov-2 infection and should not be used as the sole basis for treatment or other patient management decisions. A  negative result may occur with  improper specimen collection/handling, submission of specimen other than nasopharyngeal swab, presence of viral mutation(s) within the areas targeted by this assay, and inadequate number of viral copies(<138 copies/mL). A negative result must be combined with clinical observations, patient history, and epidemiological information. The expected result is Negative.  Fact Sheet for Patients:  BloggerCourse.comhttps://www.fda.gov/media/152166/download  Fact Sheet for Healthcare Providers:  SeriousBroker.ithttps://www.fda.gov/media/152162/download  This test is no                          t yet approved or cleared by the [place] FDA and  has been authorized for detection and/or diagnosis of SARS-CoV-2 by FDA under an Emergency Use Authorization (EUA). This EUA will remain  in effect (meaning this test can be used) for the duration of the COVID-19 declaration under Section 564(b)(1) of the Act, 21 U.S.C.section 360bbb-3(b)(1), unless the authorization is terminated  or revoked sooner.       Influenza A by PCR 07/25/2022 NEGATIVE  NEGATIVE Final   Influenza B by PCR 07/25/2022 NEGATIVE  NEGATIVE Final   Comment: (NOTE) The Xpert Xpress SARS-CoV-2/FLU/RSV plus assay is intended as an aid in the diagnosis of influenza from Nasopharyngeal swab specimens and should not be used as a sole basis for treatment. Nasal washings and aspirates are unacceptable for Xpert Xpress SARS-CoV-2/FLU/RSV testing.  Fact Sheet for Patients: BloggerCourse.comhttps://www.fda.gov/media/152166/download  Fact Sheet for Healthcare Providers: SeriousBroker.ithttps://www.fda.gov/media/152162/download  This test is not yet approved or cleared by the [place] FDA and has been authorized for detection and/or diagnosis of SARS-CoV-2 by FDA under an Emergency Use Authorization (EUA). This EUA will remain in effect (meaning this test can be used) for the duration of the COVID-19 declaration under Section 564(b)(1) of the Act, 21  U.S.C. section 360bbb-3(b)(1), unless the authorization is terminated or revoked.  Performed at [facility], [address] [place], [place] [address]  WBC 07/25/2022 8.3  4.0 - 10.5 K/uL Final   RBC 07/25/2022 4.44  3.87 - 5.11 MIL/uL Final   Hemoglobin 07/25/2022 13.7  12.0 - 15.0 g/dL Final   HCT 09/81/1914 40.2  36.0 - 46.0 % Final   MCV 07/25/2022 90.5  80.0 - 100.0 fL Final   MCH 07/25/2022 30.9  26.0 - 34.0 pg Final   MCHC 07/25/2022 34.1  30.0 - 36.0 g/dL Final   RDW 78/29/5621 12.2  11.5 - 15.5 % Final   Platelets 07/25/2022 248  150 - 400 K/uL Final   nRBC 07/25/2022 0.0  0.0 - 0.2 % Final   Neutrophils Relative % 07/25/2022 43  % Final   Neutro Abs 07/25/2022 3.6  1.7 - 7.7 K/uL Final   Lymphocytes Relative 07/25/2022 52  % Final   Lymphs Abs 07/25/2022 4.2 (H)  0.7 - 4.0 K/uL Final   Monocytes Relative 07/25/2022 5  % Final   Monocytes Absolute 07/25/2022 0.4  0.1 - 1.0 K/uL Final   Eosinophils Relative 07/25/2022 0  % Final   Eosinophils Absolute 07/25/2022 0.0  0.0 - 0.5 K/uL Final   Basophils Relative 07/25/2022 0  % Final   Basophils Absolute 07/25/2022 0.0  0.0 - 0.1 K/uL Final   Immature Granulocytes 07/25/2022 0  % Final   Abs Immature Granulocytes 07/25/2022 0.02  0.00 - 0.07 K/uL Final   Performed at [facility], [address] [place], [place] [address]   Sodium 07/25/2022 138  135 - 145 mmol/L Final   Potassium 07/25/2022 4.0  3.5 - 5.1 mmol/L Final   Chloride 07/25/2022 104  98 - 111 mmol/L Final   CO2 07/25/2022 29  22 - 32 mmol/L Final   Glucose, Bld 07/25/2022 83  70 - 99 mg/dL Final   Glucose reference range applies only to samples taken after fasting for at least 8 hours.   BUN 07/25/2022 11  6 - 20 mg/dL Final   Creatinine, Ser 07/25/2022 0.97  0.44 - 1.00 mg/dL Final   Calcium 78/46/9629 9.2  8.9 - 10.3 mg/dL Final   Total Protein 52/84/1324 7.0  6.5 - 8.1 g/dL Final   Albumin 40/07/2724 3.8  3.5 - 5.0 g/dL Final   AST  36/64/4034 18  15 - 41 U/L Final   ALT 07/25/2022 22  0 - 44 U/L Final   Alkaline Phosphatase 07/25/2022 64  38 - 126 U/L Final   Total Bilirubin 07/25/2022 0.2 (L)  0.3 - 1.2 mg/dL Final   GFR, Estimated 07/25/2022 >60  >60 mL/min Final   Comment: (NOTE) Calculated using the CKD-EPI Creatinine Equation (2021)    Anion gap 07/25/2022 5  5 - 15 Final   Performed at [facility], [address], [place], [place] [address]   POC Amphetamine UR 07/25/2022 None Detected  NONE DETECTED (Cut Off Level 1000 ng/mL) Preliminary   POC Secobarbital (BAR) 07/25/2022 None Detected  NONE DETECTED (Cut Off Level 300 ng/mL) Preliminary   POC Buprenorphine (BUP) 07/25/2022 None Detected  NONE DETECTED (Cut Off Level 10 ng/mL) Preliminary   POC Oxazepam (BZO) 07/25/2022 None Detected  NONE DETECTED (Cut Off Level 300 ng/mL) Preliminary   POC Cocaine UR 07/25/2022 None Detected  NONE DETECTED (Cut Off Level 300 ng/mL) Preliminary   POC Methamphetamine UR 07/25/2022 None Detected  NONE DETECTED (Cut Off Level 1000 ng/mL) Preliminary   POC Morphine 07/25/2022 None Detected  NONE DETECTED (Cut Off Level 300 ng/mL) Preliminary   POC Methadone  UR 07/25/2022 None Detected  NONE DETECTED (Cut Off Level 300 ng/mL) Preliminary   POC Oxycodone UR 07/25/2022 None Detected  NONE DETECTED (Cut Off Level 100 ng/mL) Preliminary   POC Marijuana UR 07/25/2022 None Detected  NONE DETECTED (Cut Off Level 50 ng/mL) Preliminary   SARSCOV2ONAVIRUS 2 AG 07/25/2022 NEGATIVE  NEGATIVE Final   Comment: (NOTE) SARS-CoV-2 antigen NOT DETECTED.   Negative results are presumptive.  Negative results do not preclude SARS-CoV-2 infection and should not be used as the sole basis for treatment or other patient management decisions, including infection  control decisions, particularly in the presence of clinical signs and  symptoms consistent with COVID-19, or in those who have been in contact with the virus.  Negative results must be  combined with clinical observations, patient history, and epidemiological information. The expected result is Negative.  Fact Sheet for Patients: https://www.jennings-kim.com/https://www.fda.gov/media/141569/download  Fact Sheet for Healthcare Providers: https://alexander-rogers.biz/https://www.fda.gov/media/141568/download  This test is not yet approved or cleared by the [place] FDA and  has been authorized for detection and/or diagnosis of SARS-CoV-2 by FDA under an Emergency Use Authorization (EUA).  This EUA will remain in effect (meaning this test can be used) for the duration of  the COV                          ID-19 declaration under Section 564(b)(1) of the Act, 21 U.S.C. section 360bbb-3(b)(1), unless the authorization is terminated or revoked sooner.     Cholesterol 07/25/2022 181  0 - 200 mg/dL Final   Triglycerides 16/10/960410/09/2022 118  <150 mg/dL Final   HDL 54/09/811910/09/2022 45  >40 mg/dL Final   Total CHOL/HDL Ratio 07/25/2022 4.0  RATIO Final   VLDL 07/25/2022 24  0 - 40 mg/dL Final   LDL Cholesterol 07/25/2022 112 (H)  0 - 99 mg/dL Final   Comment:        Total Cholesterol/HDL:CHD Risk Coronary Heart Disease Risk Table                     Men   Women  1/2 Average Risk   3.4   3.3  Average Risk       5.0   4.4  2 X Average Risk   9.6   7.1  3 X Average Risk  23.4   11.0        Use the calculated Patient Ratio above and the CHD Risk Table to determine the patient's CHD Risk.        ATP III CLASSIFICATION (LDL):  <100     mg/dL   Optimal  [id number]  mg/dL   Near or Above                    Optimal  130-159  mg/dL   Borderline  [phone]  mg/dL   High  >865>190     mg/dL   Very High Performed at [facility], [address] [place], [place] [address]    TSH 07/25/2022 6.668 (H)  0.350 - 4.500 uIU/mL Final   Comment: Performed by a 3rd Generation assay with a functional sensitivity of <=0.01 uIU/mL. Performed at [facility], [address] [place], [place] [address]    Glucose-Capillary 07/26/2022 104 (H)  70  - 99 mg/dL Final   Glucose reference range applies only to samples taken after fasting for at least 8 hours.   T3, Free 07/31/2022 2.3  2.0 -  4.4 pg/mL Final   Comment: (NOTE) Performed At: [facility] [address] [place], [place] [id number] Jolene Schimke MD FT:[id number]    Free T4 07/31/2022 0.60 (L)  0.61 - 1.12 ng/dL Final   Comment: (NOTE) Biotin ingestion may interfere with free T4 tests. If the results are inconsistent with the TSH level, previous test results, or the clinical presentation, then consider biotin interference. If needed, order repeat testing after stopping biotin. Performed at [facility], [address], [place], [place] [address]   Admission on 07/04/2022, Discharged on 07/05/2022  Component Date Value Ref Range Status   Sodium 07/04/2022 142  135 - 145 mmol/L Final   Potassium 07/04/2022 4.4  3.5 - 5.1 mmol/L Final   Chloride 07/04/2022 109  98 - 111 mmol/L Final   CO2 07/04/2022 26  22 - 32 mmol/L Final   Glucose, Bld 07/04/2022 96  70 - 99 mg/dL Final   Glucose reference range applies only to samples taken after fasting for at least 8 hours.   BUN 07/04/2022 15  6 - 20 mg/dL Final   Creatinine, Ser 07/04/2022 0.83  0.44 - 1.00 mg/dL Final   Calcium 62/83/1517 9.3  8.9 - 10.3 mg/dL Final   Total Protein 61/60/7371 7.2  6.5 - 8.1 g/dL Final   Albumin 04/08/9484 3.9  3.5 - 5.0 g/dL Final   AST 46/27/0350 23  15 - 41 U/L Final   ALT 07/04/2022 28  0 - 44 U/L Final   Alkaline Phosphatase 07/04/2022 77  38 - 126 U/L Final   Total Bilirubin 07/04/2022 0.4  0.3 - 1.2 mg/dL Final   GFR, Estimated 07/04/2022 >60  >60 mL/min Final   Comment: (NOTE) Calculated using the CKD-EPI Creatinine Equation (2021)    Anion gap 07/04/2022 7  5 - 15 Final   Performed at [facility], [address] [place], [place] [address]   Alcohol, Ethyl (B) 07/04/2022 <10  <10 mg/dL Final   Comment: (NOTE) Lowest detectable limit for serum  alcohol is 10 mg/dL.  For medical purposes only. Performed at [facility], [address] [place], [place] [address]    WBC 07/04/2022 7.0  4.0 - 10.5 K/uL Final   RBC 07/04/2022 4.18  3.87 - 5.11 MIL/uL Final   Hemoglobin 07/04/2022 12.8  12.0 - 15.0 g/dL Final   HCT 71/69/6789 39.1  36.0 - 46.0 % Final   MCV 07/04/2022 93.5  80.0 - 100.0 fL Final   MCH 07/04/2022 30.6  26.0 - 34.0 pg Final   MCHC 07/04/2022 32.7  30.0 - 36.0 g/dL Final   RDW 38/07/1750 12.9  11.5 - 15.5 % Final   Platelets 07/04/2022 243  150 - 400 K/uL Final   nRBC 07/04/2022 0.0  0.0 - 0.2 % Final   Neutrophils Relative % 07/04/2022 43  % Final   Neutro Abs 07/04/2022 3.0  1.7 - 7.7 K/uL Final   Lymphocytes Relative 07/04/2022 50  % Final   Lymphs Abs 07/04/2022 3.5  0.7 - 4.0 K/uL Final   Monocytes Relative 07/04/2022 7  % Final   Monocytes Absolute 07/04/2022 0.5  0.1 - 1.0 K/uL Final   Eosinophils Relative 07/04/2022 0  % Final   Eosinophils Absolute 07/04/2022 0.0  0.0 - 0.5 K/uL Final   Basophils Relative 07/04/2022 0  % Final   Basophils Absolute 07/04/2022 0.0  0.0 - 0.1 K/uL Final   Immature Granulocytes 07/04/2022 0  % Final   Abs  Immature Granulocytes 07/04/2022 0.01  0.00 - 0.07 K/uL Final   Performed at [facility], [address], [place], [place] [address]   I-stat hCG, quantitative 07/04/2022 <5.0  <5 mIU/mL Final   Comment 3 07/04/2022          Final   Comment:   GEST. AGE      CONC.  (mIU/mL)   <=1 WEEK        5 - 50     2 WEEKS       50 - 500     3 WEEKS       100 - 10,000     4 WEEKS     1,000 - 30,000        FEMALE AND NON-PREGNANT FEMALE:     LESS THAN 5 mIU/mL   Admission on 06/14/2022, Discharged on 06/14/2022  Component Date Value Ref Range Status   Color, UA 06/14/2022 yellow  yellow Final   Clarity, UA 06/14/2022 cloudy (A)  clear Final   Glucose, UA 06/14/2022 negative  negative mg/dL Final   Bilirubin, UA 40/07/2724 negative  negative  Final   Ketones, POC UA 06/14/2022 negative  negative mg/dL Final   Spec Grav, UA 36/64/4034 1.025  1.010 - 1.025 Final   Blood, UA 06/14/2022 negative  negative Final   pH, UA 06/14/2022 7.5  5.0 - 8.0 Final   Protein Ur, POC 06/14/2022 =30 (A)  negative mg/dL Final   Urobilinogen, UA 06/14/2022 0.2  0.2 or 1.0 E.U./dL Final   Nitrite, UA 74/25/9563 Negative  Negative Final   Leukocytes, UA 06/14/2022 Negative  Negative Final   Preg Test, Ur 06/14/2022 Negative  Negative Final   Specimen Description 06/14/2022 URINE, CLEAN CATCH   Final   Special Requests 06/14/2022    Final                   Value:NONE Performed at [facility], [address], [place], [place] [address]    Culture 06/14/2022 MULTIPLE SPECIES PRESENT, SUGGEST RECOLLECTION (A)   Final   Report Status 06/14/2022 06/16/2022 FINAL   Final  Admission on 06/07/2022, Discharged on 06/10/2022  Component Date Value Ref Range Status   SARS Coronavirus 2 by RT PCR 06/07/2022 NEGATIVE  NEGATIVE Final   Comment: (NOTE) SARS-CoV-2 target nucleic acids are NOT DETECTED.  The SARS-CoV-2 RNA is generally detectable in upper respiratory specimens during the acute phase of infection. The lowest concentration of SARS-CoV-2 viral copies this assay can detect is 138 copies/mL. A negative result does not preclude SARS-Cov-2 infection and should not be used as the sole basis for treatment or other patient management decisions. A negative result may occur with  improper specimen collection/handling, submission of specimen other than nasopharyngeal swab, presence of viral mutation(s) within the areas targeted by this assay, and inadequate number of viral copies(<138 copies/mL). A negative result must be combined with clinical observations, patient history, and epidemiological information. The expected result is Negative.  Fact Sheet for Patients:  BloggerCourse.com  Fact Sheet for Healthcare Providers:   SeriousBroker.it  This test is no                          t yet approved or cleared by the [place] FDA and  has been authorized for detection and/or diagnosis of SARS-CoV-2 by FDA under an Emergency Use Authorization (EUA). This EUA will remain  in effect (meaning this test can  be used) for the duration of the COVID-19 declaration under Section 564(b)(1) of the Act, 21 U.S.C.section 360bbb-3(b)(1), unless the authorization is terminated  or revoked sooner.       Influenza A by PCR 06/07/2022 NEGATIVE  NEGATIVE Final   Influenza B by PCR 06/07/2022 NEGATIVE  NEGATIVE Final   Comment: (NOTE) The Xpert Xpress SARS-CoV-2/FLU/RSV plus assay is intended as an aid in the diagnosis of influenza from Nasopharyngeal swab specimens and should not be used as a sole basis for treatment. Nasal washings and aspirates are unacceptable for Xpert Xpress SARS-CoV-2/FLU/RSV testing.  Fact Sheet for Patients: BloggerCourse.com  Fact Sheet for Healthcare Providers: SeriousBroker.it  This test is not yet approved or cleared by the [place] FDA and has been authorized for detection and/or diagnosis of SARS-CoV-2 by FDA under an Emergency Use Authorization (EUA). This EUA will remain in effect (meaning this test can be used) for the duration of the COVID-19 declaration under Section 564(b)(1) of the Act, 21 U.S.C. section 360bbb-3(b)(1), unless the authorization is terminated or revoked.  Performed at [facility], [address] [place], [place] [address]    WBC 06/07/2022 5.7  4.0 - 10.5 K/uL Final   RBC 06/07/2022 4.39  3.87 - 5.11 MIL/uL Final   Hemoglobin 06/07/2022 13.2  12.0 - 15.0 g/dL Final   HCT 40/98/1191 40.4  36.0 - 46.0 % Final   MCV 06/07/2022 92.0  80.0 - 100.0 fL Final   MCH 06/07/2022 30.1  26.0 - 34.0 pg Final   MCHC 06/07/2022 32.7  30.0 - 36.0 g/dL Final   RDW 47/82/9562 12.4  11.5  - 15.5 % Final   Platelets 06/07/2022 308  150 - 400 K/uL Final   nRBC 06/07/2022 0.0  0.0 - 0.2 % Final   Neutrophils Relative % 06/07/2022 42  % Final   Neutro Abs 06/07/2022 2.4  1.7 - 7.7 K/uL Final   Lymphocytes Relative 06/07/2022 54  % Final   Lymphs Abs 06/07/2022 3.1  0.7 - 4.0 K/uL Final   Monocytes Relative 06/07/2022 4  % Final   Monocytes Absolute 06/07/2022 0.2  0.1 - 1.0 K/uL Final   Eosinophils Relative 06/07/2022 0  % Final   Eosinophils Absolute 06/07/2022 0.0  0.0 - 0.5 K/uL Final   Basophils Relative 06/07/2022 0  % Final   Basophils Absolute 06/07/2022 0.0  0.0 - 0.1 K/uL Final   Immature Granulocytes 06/07/2022 0  % Final   Abs Immature Granulocytes 06/07/2022 0.01  0.00 - 0.07 K/uL Final   Performed at [name], [address] [place], [place] [address]   Sodium 06/07/2022 139  135 - 145 mmol/L Final   Potassium 06/07/2022 4.0  3.5 - 5.1 mmol/L Final   Chloride 06/07/2022 104  98 - 111 mmol/L Final   CO2 06/07/2022 28  22 - 32 mmol/L Final   Glucose, Bld 06/07/2022 104 (H)  70 - 99 mg/dL Final   Glucose reference range applies only to samples taken after fasting for at least 8 hours.   BUN 06/07/2022 8  6 - 20 mg/dL Final   Creatinine, Ser 06/07/2022 0.84  0.44 - 1.00 mg/dL Final   Calcium 57/84/6962 9.1  8.9 - 10.3 mg/dL Final   Total Protein 95/28/4132 6.9  6.5 - 8.1 g/dL Final   Albumin 44/10/270 3.7  3.5 - 5.0 g/dL Final   AST 53/66/4403 19  15 - 41 U/L Final   ALT 06/07/2022 22  0 - 44  U/L Final   Alkaline Phosphatase 06/07/2022 54  38 - 126 U/L Final   Total Bilirubin 06/07/2022 0.4  0.3 - 1.2 mg/dL Final   GFR, Estimated 06/07/2022 >60  >60 mL/min Final   Comment: (NOTE) Calculated using the CKD-EPI Creatinine Equation (2021)    Anion gap 06/07/2022 7  5 - 15 Final   Performed at [place] [address] [place], [place] [address]   Hgb A1c MFr Bld 06/07/2022 5.0  4.8 - 5.6 % Final   Comment: (NOTE) Pre diabetes:           5.7%-6.4%  Diabetes:              >6.4%  Glycemic control for   <7.0% adults with diabetes    Mean Plasma Glucose 06/07/2022 96.8  mg/dL Final   Performed at [facility], [place] [address] [place], [place] [address]   TSH 06/07/2022 1.620  0.350 - 4.500 uIU/mL Final   Comment: Performed by a 3rd Generation assay with a functional sensitivity of <=0.01 uIU/mL. Performed at [facility], [place] [address] [place], [place] [address]    RPR Ser Ql 06/07/2022 NON REACTIVE  NON REACTIVE Final   Performed at [facility], [place] [address] [place], [place] [address]   Color, Urine 06/07/2022 YELLOW  YELLOW Final   APPearance 06/07/2022 HAZY (A)  CLEAR Final   Specific Gravity, Urine 06/07/2022 1.018  1.005 - 1.030 Final   pH 06/07/2022 7.0  5.0 - 8.0 Final   Glucose, UA 06/07/2022 NEGATIVE  NEGATIVE mg/dL Final   Hgb urine dipstick 06/07/2022 NEGATIVE  NEGATIVE Final   Bilirubin Urine 06/07/2022 NEGATIVE  NEGATIVE Final   Ketones, ur 06/07/2022 NEGATIVE  NEGATIVE mg/dL Final   Protein, ur 06/07/2022 NEGATIVE  NEGATIVE mg/dL Final   Nitrite 06/07/2022 NEGATIVE  NEGATIVE Final   Leukocytes,Ua 06/07/2022 NEGATIVE  NEGATIVE Final   Performed at [facility], [place] [address] [place], [place] [address]   Cholesterol 06/07/2022 164  0 - 200 mg/dL Final   Triglycerides 06/07/2022 158 (H)  <150 mg/dL Final   HDL 06/07/2022 44  >40 mg/dL Final   Total CHOL/HDL Ratio 06/07/2022 3.7  RATIO Final   VLDL 06/07/2022 32  0 - 40 mg/dL Final   LDL Cholesterol 06/07/2022 88  0 - 99 mg/dL Final   Comment:        Total Cholesterol/HDL:CHD Risk Coronary Heart Disease Risk Table                     Men   Women  1/2 Average Risk   3.4   3.3  Average Risk       5.0   4.4  2 X Average Risk   9.6   7.1  3 X Average Risk  23.4   11.0        Use the calculated Patient Ratio above and the CHD Risk Table to determine the patient's CHD Risk.        ATP III CLASSIFICATION (LDL):  <100      mg/dL   Optimal  100-129  mg/dL   Near or Above                    Optimal  130-159  mg/dL   Borderline  160-189  mg/dL   High  >190     mg/dL   Very High Performed at [facility],  [address] [place], [place] [address]    HIV Screen 4th Generation wRfx 06/07/2022 Non Reactive  Non Reactive Final   Performed at [facility], [address] [place] [address], [place], [place] [address]   SARSCOV2ONAVIRUS 2 AG 06/07/2022 NEGATIVE  NEGATIVE Final   Comment: (NOTE) SARS-CoV-2 antigen NOT DETECTED.   Negative results are presumptive.  Negative results do not preclude SARS-CoV-2 infection and should not be used as the sole basis for treatment or other patient management decisions, including infection  control decisions, particularly in the presence of clinical signs and  symptoms consistent with COVID-19, or in those who have been in contact with the virus.  Negative results must be combined with clinical observations, patient history, and epidemiological information. The expected result is Negative.  Fact Sheet for Patients: https://www.jennings-kim.com/  Fact Sheet for Healthcare Providers: https://alexander-rogers.biz/  This test is not yet approved or cleared by the [place] FDA and  has been authorized for detection and/or diagnosis of SARS-CoV-2 by FDA under an Emergency Use Authorization (EUA).  This EUA will remain in effect (meaning this test can be used) for the duration of  the COV                          ID-19 declaration under Section 564(b)(1) of the Act, 21 U.S.C. section 360bbb-3(b)(1), unless the authorization is terminated or revoked sooner.     POC Amphetamine UR 06/07/2022 None Detected  NONE DETECTED (Cut Off Level 1000 ng/mL) Final   POC Secobarbital (BAR) 06/07/2022 None Detected  NONE DETECTED (Cut Off Level 300 ng/mL) Final   POC Buprenorphine (BUP) 06/07/2022 None Detected  NONE DETECTED (Cut Off Level 10 ng/mL) Final   POC  Oxazepam (BZO) 06/07/2022 None Detected  NONE DETECTED (Cut Off Level 300 ng/mL) Final   POC Cocaine UR 06/07/2022 None Detected  NONE DETECTED (Cut Off Level 300 ng/mL) Final   POC Methamphetamine UR 06/07/2022 None Detected  NONE DETECTED (Cut Off Level 1000 ng/mL) Final   POC Morphine 06/07/2022 None Detected  NONE DETECTED (Cut Off Level 300 ng/mL) Final   POC Methadone UR 06/07/2022 None Detected  NONE DETECTED (Cut Off Level 300 ng/mL) Final   POC Oxycodone UR 06/07/2022 None Detected  NONE DETECTED (Cut Off Level 100 ng/mL) Final   POC Marijuana UR 06/07/2022 None Detected  NONE DETECTED (Cut Off Level 50 ng/mL) Final   Preg Test, Ur 06/08/2022 NEGATIVE  NEGATIVE Final   Comment:        THE SENSITIVITY OF THIS METHODOLOGY IS >20 mIU/mL. Performed at [facility], [address], [place], [place] [address]    Preg Test, Ur 06/08/2022 NEGATIVE  NEGATIVE Final   Comment:        THE SENSITIVITY OF THIS METHODOLOGY IS >24 mIU/mL   Admission on 02/07/2022, Discharged on 02/08/2022  Component Date Value Ref Range Status   SARS Coronavirus 2 by RT PCR 02/07/2022 NEGATIVE  NEGATIVE Final   Comment: (NOTE) SARS-CoV-2 target nucleic acids are NOT DETECTED.  The SARS-CoV-2 RNA is generally detectable in upper respiratory specimens during the acute phase of infection. The lowest concentration of SARS-CoV-2 viral copies this assay can detect is 138 copies/mL. A negative result does not preclude SARS-Cov-2 infection and should not be used as the sole basis for treatment or other patient management decisions. A negative result may occur with  improper specimen collection/handling, submission of specimen other than nasopharyngeal swab, presence of viral  mutation(s) within the areas targeted by this assay, and inadequate number of viral copies(<138 copies/mL). A negative result must be combined with clinical observations, patient history, and epidemiological information. The expected  result is Negative.  Fact Sheet for Patients:  BloggerCourse.com  Fact Sheet for Healthcare Providers:  SeriousBroker.it  This test is no                          t yet approved or cleared by the [place] FDA and  has been authorized for detection and/or diagnosis of SARS-CoV-2 by FDA under an Emergency Use Authorization (EUA). This EUA will remain  in effect (meaning this test can be used) for the duration of the COVID-19 declaration under Section 564(b)(1) of the Act, 21 U.S.C.section 360bbb-3(b)(1), unless the authorization is terminated  or revoked sooner.       Influenza A by PCR 02/07/2022 NEGATIVE  NEGATIVE Final   Influenza B by PCR 02/07/2022 NEGATIVE  NEGATIVE Final   Comment: (NOTE) The Xpert Xpress SARS-CoV-2/FLU/RSV plus assay is intended as an aid in the diagnosis of influenza from Nasopharyngeal swab specimens and should not be used as a sole basis for treatment. Nasal washings and aspirates are unacceptable for Xpert Xpress SARS-CoV-2/FLU/RSV testing.  Fact Sheet for Patients: BloggerCourse.com  Fact Sheet for Healthcare Providers: SeriousBroker.it  This test is not yet approved or cleared by the [place] FDA and has been authorized for detection and/or diagnosis of SARS-CoV-2 by FDA under an Emergency Use Authorization (EUA). This EUA will remain in effect (meaning this test can be used) for the duration of the COVID-19 declaration under Section 564(b)(1) of the Act, 21 U.S.C. section 360bbb-3(b)(1), unless the authorization is terminated or revoked.  Performed at [facility], [address], [place], [place] [address]    WBC 02/07/2022 8.1  4.0 - 10.5 K/uL Final   RBC 02/07/2022 4.55  3.87 - 5.11 MIL/uL Final   Hemoglobin 02/07/2022 13.6  12.0 - 15.0 g/dL Final   HCT 60/45/4098 41.9  36.0 - 46.0 % Final   MCV 02/07/2022 92.1  80.0 -  100.0 fL Final   MCH 02/07/2022 29.9  26.0 - 34.0 pg Final   MCHC 02/07/2022 32.5  30.0 - 36.0 g/dL Final   RDW 11/91/4782 12.9  11.5 - 15.5 % Final   Platelets 02/07/2022 286  150 - 400 K/uL Final   nRBC 02/07/2022 0.0  0.0 - 0.2 % Final   Neutrophils Relative % 02/07/2022 41  % Final   Neutro Abs 02/07/2022 3.3  1.7 - 7.7 K/uL Final   Lymphocytes Relative 02/07/2022 53  % Final   Lymphs Abs 02/07/2022 4.3 (H)  0.7 - 4.0 K/uL Final   Monocytes Relative 02/07/2022 6  % Final   Monocytes Absolute 02/07/2022 0.5  0.1 - 1.0 K/uL Final   Eosinophils Relative 02/07/2022 0  % Final   Eosinophils Absolute 02/07/2022 0.0  0.0 - 0.5 K/uL Final   Basophils Relative 02/07/2022 0  % Final   Basophils Absolute 02/07/2022 0.0  0.0 - 0.1 K/uL Final   Immature Granulocytes 02/07/2022 0  % Final   Abs Immature Granulocytes 02/07/2022 0.02  0.00 - 0.07 K/uL Final   Performed at [facility], [address], [place], [place] [address]   Sodium 02/07/2022 137  135 - 145 mmol/L Final   Potassium 02/07/2022 4.1  3.5 - 5.1 mmol/L Final   Chloride 02/07/2022 104  98 -  111 mmol/L Final   CO2 02/07/2022 29  22 - 32 mmol/L Final   Glucose, Bld 02/07/2022 92  70 - 99 mg/dL Final   Glucose reference range applies only to samples taken after fasting for at least 8 hours.   BUN 02/07/2022 10  6 - 20 mg/dL Final   Creatinine, Ser 02/07/2022 0.88  0.44 - 1.00 mg/dL Final   Calcium 16/07/9603 9.4  8.9 - 10.3 mg/dL Final   Total Protein 54/06/8118 7.2  6.5 - 8.1 g/dL Final   Albumin 14/78/2956 3.9  3.5 - 5.0 g/dL Final   AST 21/30/8657 18  15 - 41 U/L Final   ALT 02/07/2022 25  0 - 44 U/L Final   Alkaline Phosphatase 02/07/2022 66  38 - 126 U/L Final   Total Bilirubin 02/07/2022 0.2 (L)  0.3 - 1.2 mg/dL Final   GFR, Estimated 02/07/2022 >60  >60 mL/min Final   Comment: (NOTE) Calculated using the CKD-EPI Creatinine Equation (2021)    Anion gap 02/07/2022 4 (L)  5 - 15 Final   Performed at [facility], [address] [place], [place] [address]   Hgb A1c MFr Bld 02/07/2022 5.0  4.8 - 5.6 % Final   Comment: (NOTE) Pre diabetes:          5.7%-6.4%  Diabetes:              >6.4%  Glycemic control for   <7.0% adults with diabetes    Mean Plasma Glucose 02/07/2022 96.8  mg/dL Final   Performed at [facility], [address], [place], [place] [address]   Alcohol, Ethyl (B) 02/07/2022 <10  <10 mg/dL Final   Comment: (NOTE) Lowest detectable limit for serum alcohol is 10 mg/dL.  For medical purposes only. Performed at [facility], [address] [place], [place] [address]    Cholesterol 02/07/2022 192  0 - 200 mg/dL Final   Triglycerides 40/07/2724 192 (H)  <150 mg/dL Final   HDL 36/64/4034 46  >40 mg/dL Final   Total CHOL/HDL Ratio 02/07/2022 4.2  RATIO Final   VLDL 02/07/2022 38  0 - 40 mg/dL Final   LDL Cholesterol 02/07/2022 108 (H)  0 - 99 mg/dL Final   Comment:        Total Cholesterol/HDL:CHD Risk Coronary Heart Disease Risk Table                     Men   Women  1/2 Average Risk   3.4   3.3  Average Risk       5.0   4.4  2 X Average Risk   9.6   7.1  3 X Average Risk  23.4   11.0        Use the calculated Patient Ratio above and the CHD Risk Table to determine the patient's CHD Risk.        ATP III CLASSIFICATION (LDL):  <100     mg/dL   Optimal  742-595  mg/dL   Near or Above                    Optimal  130-159  mg/dL   Borderline  638-756  mg/dL   High  >433     mg/dL   Very High Performed at [facility], [address] [place], [place] [address]    TSH 02/07/2022 6.344 (H)  0.350 - 4.500 uIU/mL Final   Comment:  Performed by a 3rd Generation assay with a functional sensitivity of <=0.01 uIU/mL. Performed at [facility], [address] [place], [place] [address]    POC Amphetamine UR 02/07/2022 None Detected  NONE DETECTED (Cut Off Level 1000 ng/mL) Final   POC Secobarbital (BAR) 02/07/2022 None Detected  NONE DETECTED  (Cut Off Level 300 ng/mL) Final   POC Buprenorphine (BUP) 02/07/2022 None Detected  NONE DETECTED (Cut Off Level 10 ng/mL) Final   POC Oxazepam (BZO) 02/07/2022 None Detected  NONE DETECTED (Cut Off Level 300 ng/mL) Final   POC Cocaine UR 02/07/2022 None Detected  NONE DETECTED (Cut Off Level 300 ng/mL) Final   POC Methamphetamine UR 02/07/2022 None Detected  NONE DETECTED (Cut Off Level 1000 ng/mL) Final   POC Morphine 02/07/2022 None Detected  NONE DETECTED (Cut Off Level 300 ng/mL) Final   POC Oxycodone UR 02/07/2022 None Detected  NONE DETECTED (Cut Off Level 100 ng/mL) Final   POC Methadone UR 02/07/2022 None Detected  NONE DETECTED (Cut Off Level 300 ng/mL) Final   POC Marijuana UR 02/07/2022 None Detected  NONE DETECTED (Cut Off Level 50 ng/mL) Final   SARSCOV2ONAVIRUS 2 AG 02/07/2022 NEGATIVE  NEGATIVE Final   Comment: (NOTE) SARS-CoV-2 antigen NOT DETECTED.   Negative results are presumptive.  Negative results do not preclude SARS-CoV-2 infection and should not be used as the sole basis for treatment or other patient management decisions, including infection  control decisions, particularly in the presence of clinical signs and  symptoms consistent with COVID-19, or in those who have been in contact with the virus.  Negative results must be combined with clinical observations, patient history, and epidemiological information. The expected result is Negative.  Fact Sheet for Patients: https://www.jennings-kim.com/  Fact Sheet for Healthcare Providers: https://alexander-rogers.biz/  This test is not yet approved or cleared by the [place] FDA and  has been authorized for detection and/or diagnosis of SARS-CoV-2 by FDA under an Emergency Use Authorization (EUA).  This EUA will remain in effect (meaning this test can be used) for the duration of  the COV                          ID-19 declaration under Section 564(b)(1) of the Act, 21 U.S.C. section  360bbb-3(b)(1), unless the authorization is terminated or revoked sooner.     Preg Test, Ur 02/07/2022 NEGATIVE  NEGATIVE Final   Comment:        THE SENSITIVITY OF THIS METHODOLOGY IS >24 mIU/mL     Blood Alcohol level:  Lab Results  Component Value Date   ETH <10 07/04/2022   ETH <10 02/07/2022    Metabolic Disorder Labs: Lab Results  Component Value Date   HGBA1C 5.0 06/07/2022   MPG 96.8 06/07/2022   MPG 96.8 02/07/2022   No results found for: "PROLACTIN" Lab Results  Component Value Date   CHOL 181 07/25/2022   TRIG 118 07/25/2022   HDL 45 07/25/2022   CHOLHDL 4.0 07/25/2022   VLDL 24 07/25/2022   LDLCALC 112 (H) 07/25/2022   LDLCALC 88 06/07/2022    Therapeutic Lab Levels: No results found for: "LITHIUM" No results found for: "VALPROATE" No results found for: "CBMZ"  Physical Findings   GAD-7    Flowsheet Row [facility]17/20[facility]  Total GAD-7 Score 17      PHQ2-9    Flowsheet Row ED from 06/07/2022 in [facility] Office Visit from 12/17/2021 in [facility]  PHQ-2 Total Score 1 2  PHQ-9 Total Score 2 8      Flowsheet Row ED from 07/25/2022 in [facility] ED from 07/04/2022 in [facility] ED from 06/14/2022 in [facility]   C-SSRS RISK CATEGORY High Risk No Risk No Risk        Musculoskeletal  Strength & Muscle Tone: within normal limits Gait & Station: normal Patient leans: N/A  Psychiatric Specialty Exam  Presentation  General Appearance:  Appropriate for Environment  Eye Contact: Good  Speech: Clear and Coherent; Normal Rate  Speech Volume: Normal  Handedness: Right   Mood and Affect  Mood: Euthymic  Affect: Appropriate; Congruent; Full Range   Thought Process  Thought Processes: Coherent; Goal Directed;  Linear  Descriptions of Associations:Intact  Orientation:Full (Time, Place and Person)  Thought Content:Logical; WDL  Diagnosis of Schizophrenia or Schizoaffective disorder in past: No    Hallucinations:Denied AVH   Ideas of Reference:None  Suicidal Thoughts:Suicidal Thoughts: No (Contracted to safety.  Last SI or HI was on admission.) SI Active Intent and/or Plan:  (Denied)  Homicidal Thoughts:Homicidal Thoughts: No HI Active Intent and/or Plan:  (Denied) HI Passive Intent and/or Plan:  (Denied)    Sensorium  Memory: Immediate Good; Recent Good  Judgment: Fair  Insight: Fair   Executive Functions  Concentration: Good  Attention Span: Good  Recall: Good  Fund of Knowledge: Fair  Language: Good   Psychomotor Activity  Psychomotor Activity: Normal    Assets  Assets: Communication Skills; Desire for Improvement; Leisure Time; Social Support   Sleep  Sleep: Good   Physical Exam  Physical Exam Vitals and nursing note reviewed.  Constitutional:      General: She is not in acute distress.    Appearance: She is not ill-appearing or diaphoretic.  HENT:     Head: Normocephalic and atraumatic.  Pulmonary:     Effort: Pulmonary effort is normal. No respiratory distress.  Neurological:     General: No focal deficit present.     Mental Status: She is alert.    Blood pressure 108/68, pulse (!) 108, temperature 97.8 F (36.6 C), temperature source Oral, resp. rate 16, SpO2 100 %. There is no height or weight on file to calculate BMI.  Treatment Plan Summary: [facility] team continues to seek placement for patient.  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  Constipation improved, change Senokot to 1x nightly.  Current sch rx:  ARIPiprazole ER, 400 mg, Q28 days clotrimazole, ,  BID levothyroxine, 100 mcg, Q0600 nicotine, 14 mg, Q0600 ondansetron, 4 mg, Once Oxcarbazepine, 300 mg, BID pantoprazole, 20 mg, Daily QUEtiapine, 400 mg, BID [START ON 08/08/2022] senna-docusate, 1 tablet, QHS sertraline, 150 mg, Daily valACYclovir, 500 mg, Daily    Dispo Tentative date: Unsure Barrier: Housing - Appreciate CSW and APS for assistance Received proof of guardianship   Considerations for followup Check TSH 3 months on 11/03/2022. Levothyroxine was started on 08/03/22.   Patient continues to be psychiatric stable and ready for dispo pending placement availability  Clara Ellison Hughs, Student-PA 08/07/2022 10:15 AM  I was present for the entirety of the evaluation on 08/07/2022. I reviewed the patient's chart, and I participated in key portions of the  service. I discussed the case with the PA student, and I agree with the assessment and plan of care as documented in the PA student's note.   Princess Bruins, DO Psychiatry Resident, PGY-2 08/07/2022, 10:15 AM  BEHAVIORAL [facility] [address] [place] [address] Dept: [phone] Dept Fax: [phone]

## 2022-08-07 NOTE — Progress Notes (Signed)
Writer called Nicole [name]) the previous group home owner to inquiry about if a psychological evaluation had been completed this year.  Her phone number is [phone].  Owner didn't answer but the Writer left a VM asking her to call APS worker brianna williams about sending her medical records and to see if a medical evaluation has been completed within the last year.    Writer will also call the Client's father / guardian to also give him [name] contact information so he can also assist with services ongoing.  Writer discussed this information with client to give her updates.  [name] [facility]

## 2022-08-07 NOTE — ED Notes (Signed)
Pt resting quietly with eyes closed.  No pain or discomfort noted/voiced.  Breathing is even and unlabored.  Will continue to monitor for safety.  

## 2022-08-08 MED ORDER — IBUPROFEN 200 MG PO TABS: 200.0000 mg | ORAL_TABLET | Freq: Four times a day (QID) | ORAL | Status: DC | PRN

## 2022-08-08 MED ORDER — IBUPROFEN 400 MG PO TABS
400.0000 mg | ORAL_TABLET | Freq: Four times a day (QID) | ORAL | Status: DC | PRN
  Administered 2022-08-08 – 2022-08-30 (×4): 400 mg via ORAL
  Filled 2022-08-08 (×4): qty 1

## 2022-08-08 NOTE — ED Notes (Signed)
Remains asleep with no signs of distress respirations are easy as noted by the rise and fall of pt chest skin color WNL.

## 2022-08-08 NOTE — ED Notes (Signed)
Pt accepted scheduled meds and PRN meds for menstrual cramps w/o difficulty. On recheck pt stated she felt better after receiving the PRN meds. Breakfast provided per staff. Scrub shirt given d/t patients personal shirt was to revealing w/female patients present. Pt currently resting on pull out chair/bed in no observed distress. Safety maintained and will continue to monitor.

## 2022-08-08 NOTE — ED Notes (Signed)
Snacks given 

## 2022-08-08 NOTE — Care Management (Signed)
Care Management   Writer provided the APS Social worker Talbert Forest) with the [place]-2 form and a medication list to assist with securing a placement for the patient.

## 2022-08-08 NOTE — ED Notes (Signed)
[name]&Ox4. Patient denies SI/HI and AVH. Patient denies any physical complaints when asked. No acute distress noted. Support and encouragement provided. Routine safety checks conducted according to facility protocol. Encouraged patient to notify staff if thoughts of harm toward self or others arise. Patient verbalize understanding and agreement. Will continue to monitor for safety.    

## 2022-08-08 NOTE — NC FL2 (Signed)
[place] LEVEL OF CARE SCREENING TOOL     IDENTIFICATION  Patient Name: Nicole [name] Birthdate: 01/18/1991 Sex: female Admission Date (Current Location): 07/25/2022  [place] and [place] Number:  Kathleen Argue [id number] [place] and Address:   ([place] [address])      Provider Number:  ([place] )  Attending Physician Name and Address:  No att. providers found  Relative Name and Phone Number:  Marcy Salvo - Father     [phone]    Current Level of Care: Other (Comment) ([place]) Recommended Level of Care: Other (Comment) ([facility]) Prior Approval Number:    Date Approved/Denied:   PASRR Number:    Discharge Plan:  (TBD)    Current Diagnoses: Patient Active Problem List   Diagnosis Date Noted   Hypothyroidism 08/07/2022   Tobacco use disorder 08/07/2022   Severe episode of recurrent major depressive disorder, with psychotic features ([place])    Suicidal ideation    Schizophrenia, unspecified ([place]) 06/10/2022   Nexplanon in place 12/17/2021    Orientation RESPIRATION BLADDER Height & Weight     Self, Time, Situation, Place  Normal Continent Weight:   Height:     BEHAVIORAL SYMPTOMS/MOOD NEUROLOGICAL BOWEL NUTRITION STATUS   (None)  (N/A) Continent Diet  AMBULATORY STATUS COMMUNICATION OF NEEDS Skin   Independent Verbally Normal                       Personal Care Assistance Level of Assistance   (N/A)           Functional Limitations Info   (N/A)          SPECIAL CARE FACTORS FREQUENCY   (N/A)                    Contractures Contractures Info: Not present    Additional Factors Info   (N/A)               Current Medications (08/08/2022):  This is the current hospital active medication list Current Facility-Administered Medications  Medication Dose Route Frequency Provider Last Rate Last Admin   acetaminophen (TYLENOL)  tablet 650 mg  650 mg Oral Q6H PRN Onuoha, Chinwendu V, NP   650 mg at 08/08/22 0854   alum & mag hydroxide-simeth (MAALOX/MYLANTA) 200-200-20 MG/5ML suspension 30 mL  30 mL Oral Q4H PRN Onuoha, Chinwendu V, NP   30 mL at 07/27/22 2151   ARIPiprazole ER (ABILIFY MAINTENA) injection 400 mg  400 mg Intramuscular Q28 days Merrily Brittle, DO   400 mg at 08/02/22 1154   clotrimazole (LOTRIMIN) 1 % cream   Topical BID Bobbitt, Shalon E, NP   Given at 08/07/22 0900   hydrOXYzine (ATARAX) tablet 25 mg  25 mg Oral TID PRN Onuoha, Chinwendu V, NP   25 mg at 08/06/22 0922   ibuprofen (ADVIL) tablet 400 mg  400 mg Oral Q6H PRN Merrily Brittle, DO   400 mg at 08/08/22 0854   levothyroxine (SYNTHROID) tablet 100 mcg  100 mcg Oral Q0600 Merrily Brittle, DO   100 mcg at 08/08/22 0636   magnesium hydroxide (MILK OF MAGNESIA) suspension 30 mL  30 mL Oral Daily PRN Onuoha, Chinwendu V, NP   30 mL at 08/02/22 1956   nicotine (NICODERM CQ - dosed in mg/24 hours) patch 14 mg  14 mg Transdermal Q0600 Onuoha, Chinwendu V, NP   14 mg at 08/08/22 0909  ondansetron (ZOFRAN) tablet 4 mg  4 mg Oral Once Nwoko, Uchenna E, PA       Oxcarbazepine (TRILEPTAL) tablet 300 mg  300 mg Oral BID Onuoha, Chinwendu V, NP   300 mg at 08/08/22 0910   pantoprazole (PROTONIX) EC tablet 20 mg  20 mg Oral Daily Merrily Brittle, DO   20 mg at 08/08/22 0910   QUEtiapine (SEROQUEL) tablet 400 mg  400 mg Oral BID Onuoha, Chinwendu V, NP   400 mg at 08/08/22 0910   senna-docusate (Senokot-S) tablet 1 tablet  1 tablet Oral QHS Merrily Brittle, DO       sertraline (ZOLOFT) tablet 150 mg  150 mg Oral Daily Onuoha, Chinwendu V, NP   150 mg at 08/08/22 0910   traZODone (DESYREL) tablet 50 mg  50 mg Oral QHS PRN Onuoha, Chinwendu V, NP   50 mg at 08/07/22 2157   valACYclovir (VALTREX) tablet 500 mg  500 mg Oral Daily Merrily Brittle, DO   500 mg at 08/08/22 [id number]   Current Outpatient Medications  Medication Sig Dispense Refill   ABILIFY MAINTENA 400 MG PRSY  prefilled syringe 400 mg every 28 (twenty-eight) days.     cetirizine (ZYRTEC) 10 MG tablet Take 10 mg by mouth daily.     cyclobenzaprine (FLEXERIL) 10 MG tablet Take 1 tablet (10 mg total) by mouth 2 (two) times daily as needed for muscle spasms. 20 tablet 0   fluticasone (FLONASE) 50 MCG/ACT nasal spray Place 1 spray into both nostrils daily.     hydrOXYzine (ATARAX) 50 MG tablet Take 1 tablet (50 mg total) by mouth 2 (two) times daily. 60 tablet 0   meloxicam (MOBIC) 15 MG tablet Take 15 mg by mouth daily.     Oxcarbazepine (TRILEPTAL) 300 MG tablet Take 1 tablet (300 mg total) by mouth 2 (two) times daily. 60 tablet 0   QUEtiapine (SEROQUEL) 400 MG tablet Take 1 tablet (400 mg total) by mouth 2 (two) times daily. 60 tablet 0   sertraline (ZOLOFT) 50 MG tablet Take 3 tablets (150 mg total) by mouth in the morning. 90 tablet 0   traZODone (DESYREL) 100 MG tablet Take 1 tablet (100 mg total) by mouth at bedtime. 30 tablet 0   valACYclovir (VALTREX) 500 MG tablet Take 500 mg by mouth daily.       Discharge Medications: Please see discharge summary for a list of discharge medications.  Relevant Imaging Results:  Relevant Lab Results:   Additional Information N/A  [name] L Johnnye Sima

## 2022-08-08 NOTE — ED Provider Notes (Signed)
Behavioral Health Progress Note  Date and Time: 08/08/2022 11:41 AM Name: Nicole [name] MRN:  [id number]  HPI: Nicole [name] is a 31 y.o. female, with PMH of SCZ unspecified, MDD with psychosis, who presented voluntary to [facility] (07/25/2022) via [facility] after a verbal altercation with [name] (not patient's legal guardian) for transient SI.  This is patient's fourth visit to [facility] for similar concerns this year. Patient has been dismissed from previous group home due to threatening SI and HI, then leaving the group home.   Legal Guardian: Mom (Maxine [place]) Point of contact: [name]) APS is following.   Subjective:  "I am cramping." Patient was initially seen laying down in bed awake, comfortable, no acute distress.  She reported that she had a HA and "body hurts all over" due to the onset of menses yesterday. She would like some medicine for her cramps. She had not yet had breakfast. She slept "okay." Denies SI, HI, AVH. No other concerns or questions.  SI: Suicidal Thoughts: No (Contracted to safety) SI Active Intent and/or Plan:  (Denied) HI: Homicidal Thoughts: No HI Active Intent and/or Plan:  (Denied) HI Passive Intent and/or Plan:  (Denied) AVH: Hallucinations: None Description of Auditory Hallucinations: Denied Description of Visual Hallucinations: Denied Ideas of WUJ:WJXB   Mood: Euthymic Sleep:Good Appetite: Good  Diagnosis:  Final diagnoses:  Severe episode of recurrent major depressive disorder, with psychotic features (HCC)    Total Time spent with patient: 15 minutes  Past Psychiatric History: SCZ unspecified, MDD with psychosis, Anxiety. Past Medical History:  Past Medical History:  Diagnosis Date   Anxiety    Depression    Hypothyroidism 08/07/2022   Tobacco use disorder 08/07/2022    Past Surgical History:  Procedure Laterality Date   WISDOM TOOTH EXTRACTION Bilateral 2020   Family History:  Family History  Problem Relation Age of  Onset   Hypertension Father    Diabetes Father    Family Psychiatric  History: None reported Social History:  Social History   Substance and Sexual Activity  Alcohol Use Never     Social History   Substance and Sexual Activity  Drug Use Never    Social History   Socioeconomic History   Marital status: Single    Spouse name: Not on file   Number of children: Not on file   Years of education: Not on file   Highest education level: Not on file  Occupational History   Not on file  Tobacco Use   Smoking status: Every Day    Types: Cigarettes   Smokeless tobacco: Never  Vaping Use   Vaping Use: Some days  Substance and Sexual Activity   Alcohol use: Never   Drug use: Never   Sexual activity: Yes    Partners: Female    Birth control/protection: Implant  Other Topics Concern   Not on file  Social History Narrative   Not on file   Social Determinants of Health   Financial Resource Strain: Not on file  Food Insecurity: Not on file  Transportation Needs: Not on file  Physical Activity: Not on file  Stress: Not on file  Social Connections: Not on file   SDOH:  SDOH Screenings   Depression (PHQ2-9): Low Risk  (06/10/2022)  Tobacco Use: High Risk (08/07/2022)   Additional Social History:    Pain Medications: SEE MAR Prescriptions: SEE MAR Over the Counter: SEE MAR History of alcohol / drug use?: Yes Longest period of sobriety (when/how long): N/A Negative  Consequences of Use:  (NONE) Withdrawal Symptoms: None Name of Substance 1: ETOH IN PAST--PT CURRENTLY DENIES 1 - Frequency: SOCIAL USE ETOH IN PAST 1 - Method of Aquiring: LEGAL 1- Route of Use: ORAL DRINK Name of Substance 2: NICOTINE-VAPE AND CIGARETTES 2 - Amount (size/oz): LESS THAN ONE PACK 2 - Frequency: DAILY 2 - Method of Aquiring: LEGAL 2 - Route of Substance Use: ORAL SMOKE               Sleep: Fair  Appetite:  Good  Current Medications:  Current Facility-Administered Medications   Medication Dose Route Frequency Provider Last Rate Last Admin   acetaminophen (TYLENOL) tablet 650 mg  650 mg Oral Q6H PRN Onuoha, Chinwendu V, NP   650 mg at 08/08/22 0854   alum & mag hydroxide-simeth (MAALOX/MYLANTA) 200-200-20 MG/5ML suspension 30 mL  30 mL Oral Q4H PRN Onuoha, Chinwendu V, NP   30 mL at 07/27/22 2151   ARIPiprazole ER (ABILIFY MAINTENA) injection 400 mg  400 mg Intramuscular Q28 days Princess Bruins, DO   400 mg at 08/02/22 1154   clotrimazole (LOTRIMIN) 1 % cream   Topical BID Bobbitt, Shalon E, NP   Given at 08/07/22 0900   hydrOXYzine (ATARAX) tablet 25 mg  25 mg Oral TID PRN Onuoha, Chinwendu V, NP   25 mg at 08/06/22 1610   ibuprofen (ADVIL) tablet 400 mg  400 mg Oral Q6H PRN Princess Bruins, DO   400 mg at 08/08/22 0854   levothyroxine (SYNTHROID) tablet 100 mcg  100 mcg Oral Q0600 Princess Bruins, DO   100 mcg at 08/08/22 0636   magnesium hydroxide (MILK OF MAGNESIA) suspension 30 mL  30 mL Oral Daily PRN Onuoha, Chinwendu V, NP   30 mL at 08/02/22 1956   nicotine (NICODERM CQ - dosed in mg/24 hours) patch 14 mg  14 mg Transdermal Q0600 Onuoha, Chinwendu V, NP   14 mg at 08/08/22 0909   ondansetron (ZOFRAN) tablet 4 mg  4 mg Oral Once Nwoko, Uchenna E, PA       Oxcarbazepine (TRILEPTAL) tablet 300 mg  300 mg Oral BID Onuoha, Chinwendu V, NP   300 mg at 08/08/22 0910   pantoprazole (PROTONIX) EC tablet 20 mg  20 mg Oral Daily Princess Bruins, DO   20 mg at 08/08/22 0910   QUEtiapine (SEROQUEL) tablet 400 mg  400 mg Oral BID Onuoha, Chinwendu V, NP   400 mg at 08/08/22 0910   senna-docusate (Senokot-S) tablet 1 tablet  1 tablet Oral QHS Princess Bruins, DO       sertraline (ZOLOFT) tablet 150 mg  150 mg Oral Daily Onuoha, Chinwendu V, NP   150 mg at 08/08/22 0910   traZODone (DESYREL) tablet 50 mg  50 mg Oral QHS PRN Onuoha, Chinwendu V, NP   50 mg at 08/07/22 2157   valACYclovir (VALTREX) tablet 500 mg  500 mg Oral Daily Princess Bruins, DO   500 mg at 08/08/22 9604   Current  Outpatient Medications  Medication Sig Dispense Refill   ABILIFY MAINTENA 400 MG PRSY prefilled syringe 400 mg every 28 (twenty-eight) days.     cetirizine (ZYRTEC) 10 MG tablet Take 10 mg by mouth daily.     cyclobenzaprine (FLEXERIL) 10 MG tablet Take 1 tablet (10 mg total) by mouth 2 (two) times daily as needed for muscle spasms. 20 tablet 0   fluticasone (FLONASE) 50 MCG/ACT nasal spray Place 1 spray into both nostrils daily.     hydrOXYzine (ATARAX)  50 MG tablet Take 1 tablet (50 mg total) by mouth 2 (two) times daily. 60 tablet 0   meloxicam (MOBIC) 15 MG tablet Take 15 mg by mouth daily.     Oxcarbazepine (TRILEPTAL) 300 MG tablet Take 1 tablet (300 mg total) by mouth 2 (two) times daily. 60 tablet 0   QUEtiapine (SEROQUEL) 400 MG tablet Take 1 tablet (400 mg total) by mouth 2 (two) times daily. 60 tablet 0   sertraline (ZOLOFT) 50 MG tablet Take 3 tablets (150 mg total) by mouth in the morning. 90 tablet 0   traZODone (DESYREL) 100 MG tablet Take 1 tablet (100 mg total) by mouth at bedtime. 30 tablet 0   valACYclovir (VALTREX) 500 MG tablet Take 500 mg by mouth daily.      Labs  Lab Results:  Admission on 07/25/2022  Component Date Value Ref Range Status   SARS Coronavirus 2 by RT PCR 07/25/2022 NEGATIVE  NEGATIVE Final   Comment: (NOTE) SARS-CoV-2 target nucleic acids are NOT DETECTED.  The SARS-CoV-2 RNA is generally detectable in upper respiratory specimens during the acute phase of infection. The lowest concentration of SARS-CoV-2 viral copies this assay can detect is 138 copies/mL. A negative result does not preclude SARS-Cov-2 infection and should not be used as the sole basis for treatment or other patient management decisions. A negative result may occur with  improper specimen collection/handling, submission of specimen other than nasopharyngeal swab, presence of viral mutation(s) within the areas targeted by this assay, and inadequate number of viral copies(<138  copies/mL). A negative result must be combined with clinical observations, patient history, and epidemiological information. The expected result is Negative.  Fact Sheet for Patients:  BloggerCourse.com  Fact Sheet for Healthcare Providers:  SeriousBroker.it  This test is no                          t yet approved or cleared by the [place] FDA and  has been authorized for detection and/or diagnosis of SARS-CoV-2 by FDA under an Emergency Use Authorization (EUA). This EUA will remain  in effect (meaning this test can be used) for the duration of the COVID-19 declaration under Section 564(b)(1) of the Act, 21 U.S.C.section 360bbb-3(b)(1), unless the authorization is terminated  or revoked sooner.       Influenza A by PCR 07/25/2022 NEGATIVE  NEGATIVE Final   Influenza B by PCR 07/25/2022 NEGATIVE  NEGATIVE Final   Comment: (NOTE) The Xpert Xpress SARS-CoV-2/FLU/RSV plus assay is intended as an aid in the diagnosis of influenza from Nasopharyngeal swab specimens and should not be used as a sole basis for treatment. Nasal washings and aspirates are unacceptable for Xpert Xpress SARS-CoV-2/FLU/RSV testing.  Fact Sheet for Patients: BloggerCourse.com  Fact Sheet for Healthcare Providers: SeriousBroker.it  This test is not yet approved or cleared by the [place] FDA and has been authorized for detection and/or diagnosis of SARS-CoV-2 by FDA under an Emergency Use Authorization (EUA). This EUA will remain in effect (meaning this test can be used) for the duration of the COVID-19 declaration under Section 564(b)(1) of the Act, 21 U.S.C. section 360bbb-3(b)(1), unless the authorization is terminated or revoked.  Performed at [facility], [address], [place], [place] [address]    WBC 07/25/2022 8.3  4.0 - 10.5 K/uL Final   RBC 07/25/2022 4.44  3.87 - 5.11 MIL/uL  Final   Hemoglobin 07/25/2022 13.7  12.0 - 15.0 g/dL Final  HCT 07/25/2022 40.2  36.0 - 46.0 % Final   MCV 07/25/2022 90.5  80.0 - 100.0 fL Final   MCH 07/25/2022 30.9  26.0 - 34.0 pg Final   MCHC 07/25/2022 34.1  30.0 - 36.0 g/dL Final   RDW 16/07/9603 12.2  11.5 - 15.5 % Final   Platelets 07/25/2022 248  150 - 400 K/uL Final   nRBC 07/25/2022 0.0  0.0 - 0.2 % Final   Neutrophils Relative % 07/25/2022 43  % Final   Neutro Abs 07/25/2022 3.6  1.7 - 7.7 K/uL Final   Lymphocytes Relative 07/25/2022 52  % Final   Lymphs Abs 07/25/2022 4.2 (H)  0.7 - 4.0 K/uL Final   Monocytes Relative 07/25/2022 5  % Final   Monocytes Absolute 07/25/2022 0.4  0.1 - 1.0 K/uL Final   Eosinophils Relative 07/25/2022 0  % Final   Eosinophils Absolute 07/25/2022 0.0  0.0 - 0.5 K/uL Final   Basophils Relative 07/25/2022 0  % Final   Basophils Absolute 07/25/2022 0.0  0.0 - 0.1 K/uL Final   Immature Granulocytes 07/25/2022 0  % Final   Abs Immature Granulocytes 07/25/2022 0.02  0.00 - 0.07 K/uL Final   Performed at [facility], [address], [place], [place] [address]   Sodium 07/25/2022 138  135 - 145 mmol/L Final   Potassium 07/25/2022 4.0  3.5 - 5.1 mmol/L Final   Chloride 07/25/2022 104  98 - 111 mmol/L Final   CO2 07/25/2022 29  22 - 32 mmol/L Final   Glucose, Bld 07/25/2022 83  70 - 99 mg/dL Final   Glucose reference range applies only to samples taken after fasting for at least 8 hours.   BUN 07/25/2022 11  6 - 20 mg/dL Final   Creatinine, Ser 07/25/2022 0.97  0.44 - 1.00 mg/dL Final   Calcium 11/91/4782 9.2  8.9 - 10.3 mg/dL Final   Total Protein 95/62/1308 7.0  6.5 - 8.1 g/dL Final   Albumin 65/78/4696 3.8  3.5 - 5.0 g/dL Final   AST 29/52/8413 18  15 - 41 U/L Final   ALT 07/25/2022 22  0 - 44 U/L Final   Alkaline Phosphatase 07/25/2022 64  38 - 126 U/L Final   Total Bilirubin 07/25/2022 0.2 (L)  0.3 - 1.2 mg/dL Final   GFR, Estimated 07/25/2022 >60  >60 mL/min Final   Comment:  (NOTE) Calculated using the CKD-EPI Creatinine Equation (2021)    Anion gap 07/25/2022 5  5 - 15 Final   Performed at [facility], [address] [place], [place] [address]   POC Amphetamine UR 07/25/2022 None Detected  NONE DETECTED (Cut Off Level 1000 ng/mL) Preliminary   POC Secobarbital (BAR) 07/25/2022 None Detected  NONE DETECTED (Cut Off Level 300 ng/mL) Preliminary   POC Buprenorphine (BUP) 07/25/2022 None Detected  NONE DETECTED (Cut Off Level 10 ng/mL) Preliminary   POC Oxazepam (BZO) 07/25/2022 None Detected  NONE DETECTED (Cut Off Level 300 ng/mL) Preliminary   POC Cocaine UR 07/25/2022 None Detected  NONE DETECTED (Cut Off Level 300 ng/mL) Preliminary   POC Methamphetamine UR 07/25/2022 None Detected  NONE DETECTED (Cut Off Level 1000 ng/mL) Preliminary   POC Morphine 07/25/2022 None Detected  NONE DETECTED (Cut Off Level 300 ng/mL) Preliminary   POC Methadone UR 07/25/2022 None Detected  NONE DETECTED (Cut Off Level 300 ng/mL) Preliminary   POC Oxycodone UR 07/25/2022 None Detected  NONE DETECTED (Cut Off Level 100 ng/mL) Preliminary   POC  Marijuana UR 07/25/2022 None Detected  NONE DETECTED (Cut Off Level 50 ng/mL) Preliminary   SARSCOV2ONAVIRUS 2 AG 07/25/2022 NEGATIVE  NEGATIVE Final   Comment: (NOTE) SARS-CoV-2 antigen NOT DETECTED.   Negative results are presumptive.  Negative results do not preclude SARS-CoV-2 infection and should not be used as the sole basis for treatment or other patient management decisions, including infection  control decisions, particularly in the presence of clinical signs and  symptoms consistent with COVID-19, or in those who have been in contact with the virus.  Negative results must be combined with clinical observations, patient history, and epidemiological information. The expected result is Negative.  Fact Sheet for Patients: https://www.jennings-kim.com/https://www.fda.gov/media/141569/download  Fact Sheet for Healthcare  Providers: https://alexander-rogers.biz/https://www.fda.gov/media/141568/download  This test is not yet approved or cleared by the [place] FDA and  has been authorized for detection and/or diagnosis of SARS-CoV-2 by FDA under an Emergency Use Authorization (EUA).  This EUA will remain in effect (meaning this test can be used) for the duration of  the COV                          ID-19 declaration under Section 564(b)(1) of the Act, 21 U.S.C. section 360bbb-3(b)(1), unless the authorization is terminated or revoked sooner.     Cholesterol 07/25/2022 181  0 - 200 mg/dL Final   Triglycerides 16/10/960410/09/2022 118  <150 mg/dL Final   HDL 54/09/811910/09/2022 45  >40 mg/dL Final   Total CHOL/HDL Ratio 07/25/2022 4.0  RATIO Final   VLDL 07/25/2022 24  0 - 40 mg/dL Final   LDL Cholesterol 07/25/2022 112 (H)  0 - 99 mg/dL Final   Comment:        Total Cholesterol/HDL:CHD Risk Coronary Heart Disease Risk Table                     Men   Women  1/2 Average Risk   3.4   3.3  Average Risk       5.0   4.4  2 X Average Risk   9.6   7.1  3 X Average Risk  23.4   11.0        Use the calculated Patient Ratio above and the CHD Risk Table to determine the patient's CHD Risk.        ATP III CLASSIFICATION (LDL):  <100     mg/dL   Optimal  [id number]  mg/dL   Near or Above                    Optimal  130-159  mg/dL   Borderline  [phone]  mg/dL   High  >865>190     mg/dL   Very High Performed at [facility], [address] [place], [place] [address]    TSH 07/25/2022 6.668 (H)  0.350 - 4.500 uIU/mL Final   Comment: Performed by a 3rd Generation assay with a functional sensitivity of <=0.01 uIU/mL. Performed at [facility], [address] [place], [place] [address]    Glucose-Capillary 07/26/2022 104 (H)  70 - 99 mg/dL Final   Glucose reference range applies only to samples taken after fasting for at least 8 hours.   T3, Free 07/31/2022 2.3  2.0 - 4.4 pg/mL Final   Comment: (NOTE) Performed At: [facility] [address] [place], [place] [id number] Jolene SchimkeNagendra Sanjai MD UU:[id number]:[phone]    Free T4 07/31/2022 0.60 (L)  0.61 -  1.12 ng/dL Final   Comment: (NOTE) Biotin ingestion may interfere with free T4 tests. If the results are inconsistent with the TSH level, previous test results, or the clinical presentation, then consider biotin interference. If needed, order repeat testing after stopping biotin. Performed at [facility], [address], [place], [place] [address]   Admission on 07/04/2022, Discharged on 07/05/2022  Component Date Value Ref Range Status   Sodium 07/04/2022 142  135 - 145 mmol/L Final   Potassium 07/04/2022 4.4  3.5 - 5.1 mmol/L Final   Chloride 07/04/2022 109  98 - 111 mmol/L Final   CO2 07/04/2022 26  22 - 32 mmol/L Final   Glucose, Bld 07/04/2022 96  70 - 99 mg/dL Final   Glucose reference range applies only to samples taken after fasting for at least 8 hours.   BUN 07/04/2022 15  6 - 20 mg/dL Final   Creatinine, Ser 07/04/2022 0.83  0.44 - 1.00 mg/dL Final   Calcium 54/65/0354 9.3  8.9 - 10.3 mg/dL Final   Total Protein 65/68/1275 7.2  6.5 - 8.1 g/dL Final   Albumin 17/00/1749 3.9  3.5 - 5.0 g/dL Final   AST 44/96/7591 23  15 - 41 U/L Final   ALT 07/04/2022 28  0 - 44 U/L Final   Alkaline Phosphatase 07/04/2022 77  38 - 126 U/L Final   Total Bilirubin 07/04/2022 0.4  0.3 - 1.2 mg/dL Final   GFR, Estimated 07/04/2022 >60  >60 mL/min Final   Comment: (NOTE) Calculated using the CKD-EPI Creatinine Equation (2021)    Anion gap 07/04/2022 7  5 - 15 Final   Performed at [facility], [address] [place], [place] [address]   Alcohol, Ethyl (B) 07/04/2022 <10  <10 mg/dL Final   Comment: (NOTE) Lowest detectable limit for serum alcohol is 10 mg/dL.  For medical purposes only. Performed at [facility], [address], [place], [place] [address]    WBC 07/04/2022 7.0  4.0 - 10.5 K/uL Final   RBC 07/04/2022 4.18  3.87 - 5.11  MIL/uL Final   Hemoglobin 07/04/2022 12.8  12.0 - 15.0 g/dL Final   HCT 57/10/7791 39.1  36.0 - 46.0 % Final   MCV 07/04/2022 93.5  80.0 - 100.0 fL Final   MCH 07/04/2022 30.6  26.0 - 34.0 pg Final   MCHC 07/04/2022 32.7  30.0 - 36.0 g/dL Final   RDW 90/30/0923 12.9  11.5 - 15.5 % Final   Platelets 07/04/2022 243  150 - 400 K/uL Final   nRBC 07/04/2022 0.0  0.0 - 0.2 % Final   Neutrophils Relative % 07/04/2022 43  % Final   Neutro Abs 07/04/2022 3.0  1.7 - 7.7 K/uL Final   Lymphocytes Relative 07/04/2022 50  % Final   Lymphs Abs 07/04/2022 3.5  0.7 - 4.0 K/uL Final   Monocytes Relative 07/04/2022 7  % Final   Monocytes Absolute 07/04/2022 0.5  0.1 - 1.0 K/uL Final   Eosinophils Relative 07/04/2022 0  % Final   Eosinophils Absolute 07/04/2022 0.0  0.0 - 0.5 K/uL Final   Basophils Relative 07/04/2022 0  % Final   Basophils Absolute 07/04/2022 0.0  0.0 - 0.1 K/uL Final   Immature Granulocytes 07/04/2022 0  % Final   Abs Immature Granulocytes 07/04/2022 0.01  0.00 - 0.07 K/uL Final   Performed at [facility], [address], [place], [place] [address]   I-stat hCG, quantitative 07/04/2022 <5.0  <  5 mIU/mL Final   Comment 3 07/04/2022          Final   Comment:   GEST. AGE      CONC.  (mIU/mL)   <=1 WEEK        5 - 50     2 WEEKS       50 - 500     3 WEEKS       100 - 10,000     4 WEEKS     1,000 - 30,000        FEMALE AND NON-PREGNANT FEMALE:     LESS THAN 5 mIU/mL   Admission on 06/14/2022, Discharged on 06/14/2022  Component Date Value Ref Range Status   Color, UA 06/14/2022 yellow  yellow Final   Clarity, UA 06/14/2022 cloudy (A)  clear Final   Glucose, UA 06/14/2022 negative  negative mg/dL Final   Bilirubin, UA 16/07/9603 negative  negative Final   Ketones, POC UA 06/14/2022 negative  negative mg/dL Final   Spec Grav, UA 54/06/8118 1.025  1.010 - 1.025 Final   Blood, UA 06/14/2022 negative  negative Final   pH, UA 06/14/2022 7.5  5.0 - 8.0 Final   Protein  Ur, POC 06/14/2022 =30 (A)  negative mg/dL Final   Urobilinogen, UA 06/14/2022 0.2  0.2 or 1.0 E.U./dL Final   Nitrite, UA 14/78/2956 Negative  Negative Final   Leukocytes, UA 06/14/2022 Negative  Negative Final   Preg Test, Ur 06/14/2022 Negative  Negative Final   Specimen Description 06/14/2022 URINE, CLEAN CATCH   Final   Special Requests 06/14/2022    Final                   Value:NONE Performed at [facility], [address], [place], [place] [address]    Culture 06/14/2022 MULTIPLE SPECIES PRESENT, SUGGEST RECOLLECTION (A)   Final   Report Status 06/14/2022 06/16/2022 FINAL   Final  Admission on 06/07/2022, Discharged on 06/10/2022  Component Date Value Ref Range Status   SARS Coronavirus 2 by RT PCR 06/07/2022 NEGATIVE  NEGATIVE Final   Comment: (NOTE) SARS-CoV-2 target nucleic acids are NOT DETECTED.  The SARS-CoV-2 RNA is generally detectable in upper respiratory specimens during the acute phase of infection. The lowest concentration of SARS-CoV-2 viral copies this assay can detect is 138 copies/mL. A negative result does not preclude SARS-Cov-2 infection and should not be used as the sole basis for treatment or other patient management decisions. A negative result may occur with  improper specimen collection/handling, submission of specimen other than nasopharyngeal swab, presence of viral mutation(s) within the areas targeted by this assay, and inadequate number of viral copies(<138 copies/mL). A negative result must be combined with clinical observations, patient history, and epidemiological information. The expected result is Negative.  Fact Sheet for Patients:  BloggerCourse.com  Fact Sheet for Healthcare Providers:  SeriousBroker.it  This test is no                          t yet approved or cleared by the [place] FDA and  has been authorized for detection and/or diagnosis of SARS-CoV-2 by FDA under  an Emergency Use Authorization (EUA). This EUA will remain  in effect (meaning this test can be used) for the duration of the COVID-19 declaration under Section 564(b)(1) of the Act, 21 U.S.C.section 360bbb-3(b)(1), unless the authorization is terminated  or revoked sooner.  Influenza A by PCR 06/07/2022 NEGATIVE  NEGATIVE Final   Influenza B by PCR 06/07/2022 NEGATIVE  NEGATIVE Final   Comment: (NOTE) The Xpert Xpress SARS-CoV-2/FLU/RSV plus assay is intended as an aid in the diagnosis of influenza from Nasopharyngeal swab specimens and should not be used as a sole basis for treatment. Nasal washings and aspirates are unacceptable for Xpert Xpress SARS-CoV-2/FLU/RSV testing.  Fact Sheet for Patients: BloggerCourse.com  Fact Sheet for Healthcare Providers: SeriousBroker.it  This test is not yet approved or cleared by the [place] FDA and has been authorized for detection and/or diagnosis of SARS-CoV-2 by FDA under an Emergency Use Authorization (EUA). This EUA will remain in effect (meaning this test can be used) for the duration of the COVID-19 declaration under Section 564(b)(1) of the Act, 21 U.S.C. section 360bbb-3(b)(1), unless the authorization is terminated or revoked.  Performed at [facility], [address] [place], [place] [address]    WBC 06/07/2022 5.7  4.0 - 10.5 K/uL Final   RBC 06/07/2022 4.39  3.87 - 5.11 MIL/uL Final   Hemoglobin 06/07/2022 13.2  12.0 - 15.0 g/dL Final   HCT 60/45/4098 40.4  36.0 - 46.0 % Final   MCV 06/07/2022 92.0  80.0 - 100.0 fL Final   MCH 06/07/2022 30.1  26.0 - 34.0 pg Final   MCHC 06/07/2022 32.7  30.0 - 36.0 g/dL Final   RDW 11/91/4782 12.4  11.5 - 15.5 % Final   Platelets 06/07/2022 308  150 - 400 K/uL Final   nRBC 06/07/2022 0.0  0.0 - 0.2 % Final   Neutrophils Relative % 06/07/2022 42  % Final   Neutro Abs 06/07/2022 2.4  1.7 - 7.7 K/uL Final   Lymphocytes  Relative 06/07/2022 54  % Final   Lymphs Abs 06/07/2022 3.1  0.7 - 4.0 K/uL Final   Monocytes Relative 06/07/2022 4  % Final   Monocytes Absolute 06/07/2022 0.2  0.1 - 1.0 K/uL Final   Eosinophils Relative 06/07/2022 0  % Final   Eosinophils Absolute 06/07/2022 0.0  0.0 - 0.5 K/uL Final   Basophils Relative 06/07/2022 0  % Final   Basophils Absolute 06/07/2022 0.0  0.0 - 0.1 K/uL Final   Immature Granulocytes 06/07/2022 0  % Final   Abs Immature Granulocytes 06/07/2022 0.01  0.00 - 0.07 K/uL Final   Performed at [facility], [address], [place], [place] [address]   Sodium 06/07/2022 139  135 - 145 mmol/L Final   Potassium 06/07/2022 4.0  3.5 - 5.1 mmol/L Final   Chloride 06/07/2022 104  98 - 111 mmol/L Final   CO2 06/07/2022 28  22 - 32 mmol/L Final   Glucose, Bld 06/07/2022 104 (H)  70 - 99 mg/dL Final   Glucose reference range applies only to samples taken after fasting for at least 8 hours.   BUN 06/07/2022 8  6 - 20 mg/dL Final   Creatinine, Ser 06/07/2022 0.84  0.44 - 1.00 mg/dL Final   Calcium 30/86/5784 9.1  8.9 - 10.3 mg/dL Final   Total Protein 69/62/9528 6.9  6.5 - 8.1 g/dL Final   Albumin 41/32/4401 3.7  3.5 - 5.0 g/dL Final   AST 02/72/5366 19  15 - 41 U/L Final   ALT 06/07/2022 22  0 - 44 U/L Final   Alkaline Phosphatase 06/07/2022 54  38 - 126 U/L Final   Total Bilirubin 06/07/2022 0.4  0.3 - 1.2 mg/dL Final   GFR, Estimated 06/07/2022 >60  >  60 mL/min Final   Comment: (NOTE) Calculated using the CKD-EPI Creatinine Equation (2021)    Anion gap 06/07/2022 7  5 - 15 Final   Performed at [facility], [address] [place], [place] [address]   Hgb A1c MFr Bld 06/07/2022 5.0  4.8 - 5.6 % Final   Comment: (NOTE) Pre diabetes:          5.7%-6.4%  Diabetes:              >6.4%  Glycemic control for   <7.0% adults with diabetes    Mean Plasma Glucose 06/07/2022 96.8  mg/dL Final   Performed at [facility], [address] [place], [place]  [address]   TSH 06/07/2022 1.620  0.350 - 4.500 uIU/mL Final   Comment: Performed by a 3rd Generation assay with a functional sensitivity of <=0.01 uIU/mL. Performed at [facility], [address] [place], [place] [address]    RPR Ser Ql 06/07/2022 NON REACTIVE  NON REACTIVE Final   Performed at [facility], [address], [place], [place] [address]   Color, Urine 06/07/2022 YELLOW  YELLOW Final   APPearance 06/07/2022 HAZY (A)  CLEAR Final   Specific Gravity, Urine 06/07/2022 1.018  1.005 - 1.030 Final   pH 06/07/2022 7.0  5.0 - 8.0 Final   Glucose, UA 06/07/2022 NEGATIVE  NEGATIVE mg/dL Final   Hgb urine dipstick 06/07/2022 NEGATIVE  NEGATIVE Final   Bilirubin Urine 06/07/2022 NEGATIVE  NEGATIVE Final   Ketones, ur 06/07/2022 NEGATIVE  NEGATIVE mg/dL Final   Protein, ur 29/56/2130 NEGATIVE  NEGATIVE mg/dL Final   Nitrite 86/57/8469 NEGATIVE  NEGATIVE Final   Leukocytes,Ua 06/07/2022 NEGATIVE  NEGATIVE Final   Performed at [facility], [address] [place], [place] [address]   Cholesterol 06/07/2022 164  0 - 200 mg/dL Final   Triglycerides 84/13/2440 158 (H)  <150 mg/dL Final   HDL 08/10/2535 44  >40 mg/dL Final   Total CHOL/HDL Ratio 06/07/2022 3.7  RATIO Final   VLDL 06/07/2022 32  0 - 40 mg/dL Final   LDL Cholesterol 06/07/2022 88  0 - 99 mg/dL Final   Comment:        Total Cholesterol/HDL:CHD Risk Coronary Heart Disease Risk Table                     Men   Women  1/2 Average Risk   3.4   3.3  Average Risk       5.0   4.4  2 X Average Risk   9.6   7.1  3 X Average Risk  23.4   11.0        Use the calculated Patient Ratio above and the CHD Risk Table to determine the patient's CHD Risk.        ATP III CLASSIFICATION (LDL):  <100     mg/dL   Optimal  644-034  mg/dL   Near or Above                    Optimal  130-159  mg/dL   Borderline  742-595  mg/dL   High  >638     mg/dL   Very High Performed at [facility], [address],  [place], [place] [address]    HIV Screen 4th Generation wRfx 06/07/2022 Non Reactive  Non Reactive Final   Performed at [facility], [address] [place], [place] [address]   SARSCOV2ONAVIRUS 2 AG 06/07/2022 NEGATIVE  NEGATIVE Final   Comment: (NOTE) SARS-CoV-2 antigen NOT DETECTED.   Negative results are presumptive.  Negative results do not preclude SARS-CoV-2 infection and should not be used as the sole basis for treatment or other patient management decisions, including infection  control decisions, particularly in the presence of clinical signs and  symptoms consistent with COVID-19, or in those who have been in contact with the virus.  Negative results must be combined with clinical observations, patient history, and epidemiological information. The expected result is Negative.  Fact Sheet for Patients: HandmadeRecipes.com.cy  Fact Sheet for Healthcare Providers: FuneralLife.at  This test is not yet approved or cleared by the [place] FDA and  has been authorized for detection and/or diagnosis of SARS-CoV-2 by FDA under an Emergency Use Authorization (EUA).  This EUA will remain in effect (meaning this test can be used) for the duration of  the COV                          ID-19 declaration under Section 564(b)(1) of the Act, 21 U.S.C. section 360bbb-3(b)(1), unless the authorization is terminated or revoked sooner.     POC Amphetamine UR 06/07/2022 None Detected  NONE DETECTED (Cut Off Level 1000 ng/mL) Final   POC Secobarbital (BAR) 06/07/2022 None Detected  NONE DETECTED (Cut Off Level 300 ng/mL) Final   POC Buprenorphine (BUP) 06/07/2022 None Detected  NONE DETECTED (Cut Off Level 10 ng/mL) Final   POC Oxazepam (BZO) 06/07/2022 None Detected  NONE DETECTED (Cut Off Level 300 ng/mL) Final   POC Cocaine UR 06/07/2022 None Detected  NONE DETECTED (Cut Off Level 300 ng/mL) Final   POC Methamphetamine UR 06/07/2022 None  Detected  NONE DETECTED (Cut Off Level 1000 ng/mL) Final   POC Morphine 06/07/2022 None Detected  NONE DETECTED (Cut Off Level 300 ng/mL) Final   POC Methadone UR 06/07/2022 None Detected  NONE DETECTED (Cut Off Level 300 ng/mL) Final   POC Oxycodone UR 06/07/2022 None Detected  NONE DETECTED (Cut Off Level 100 ng/mL) Final   POC Marijuana UR 06/07/2022 None Detected  NONE DETECTED (Cut Off Level 50 ng/mL) Final   Preg Test, Ur 06/08/2022 NEGATIVE  NEGATIVE Final   Comment:        THE SENSITIVITY OF THIS METHODOLOGY IS >20 mIU/mL. Performed at [facility], [address] [place], [place] [address]    Preg Test, Ur 06/08/2022 NEGATIVE  NEGATIVE Final   Comment:        THE SENSITIVITY OF THIS METHODOLOGY IS >24 mIU/mL   Admission on 02/07/2022, Discharged on 02/08/2022  Component Date Value Ref Range Status   SARS Coronavirus 2 by RT PCR 02/07/2022 NEGATIVE  NEGATIVE Final   Comment: (NOTE) SARS-CoV-2 target nucleic acids are NOT DETECTED.  The SARS-CoV-2 RNA is generally detectable in upper respiratory specimens during the acute phase of infection. The lowest concentration of SARS-CoV-2 viral copies this assay can detect is 138 copies/mL. A negative result does not preclude SARS-Cov-2 infection and should not be used as the sole basis for treatment or other patient management decisions. A negative result may occur with  improper specimen collection/handling, submission of specimen other than nasopharyngeal swab, presence of viral mutation(s) within the areas targeted by this assay, and inadequate number of viral copies(<138 copies/mL). A negative result must be combined with clinical observations, patient history, and epidemiological information. The expected result is  Negative.  Fact Sheet for Patients:  BloggerCourse.com  Fact Sheet for Healthcare Providers:  SeriousBroker.it  This test is no                           t yet approved or cleared by the [place] FDA and  has been authorized for detection and/or diagnosis of SARS-CoV-2 by FDA under an Emergency Use Authorization (EUA). This EUA will remain  in effect (meaning this test can be used) for the duration of the COVID-19 declaration under Section 564(b)(1) of the Act, 21 U.S.C.section 360bbb-3(b)(1), unless the authorization is terminated  or revoked sooner.       Influenza A by PCR 02/07/2022 NEGATIVE  NEGATIVE Final   Influenza B by PCR 02/07/2022 NEGATIVE  NEGATIVE Final   Comment: (NOTE) The Xpert Xpress SARS-CoV-2/FLU/RSV plus assay is intended as an aid in the diagnosis of influenza from Nasopharyngeal swab specimens and should not be used as a sole basis for treatment. Nasal washings and aspirates are unacceptable for Xpert Xpress SARS-CoV-2/FLU/RSV testing.  Fact Sheet for Patients: BloggerCourse.com  Fact Sheet for Healthcare Providers: SeriousBroker.it  This test is not yet approved or cleared by the [place] FDA and has been authorized for detection and/or diagnosis of SARS-CoV-2 by FDA under an Emergency Use Authorization (EUA). This EUA will remain in effect (meaning this test can be used) for the duration of the COVID-19 declaration under Section 564(b)(1) of the Act, 21 U.S.C. section 360bbb-3(b)(1), unless the authorization is terminated or revoked.  Performed at [facility], [address], [place], [place] [address]    WBC 02/07/2022 8.1  4.0 - 10.5 K/uL Final   RBC 02/07/2022 4.55  3.87 - 5.11 MIL/uL Final   Hemoglobin 02/07/2022 13.6  12.0 - 15.0 g/dL Final   HCT 81/10/7508 41.9  36.0 - 46.0 % Final   MCV 02/07/2022 92.1  80.0 - 100.0 fL Final   MCH 02/07/2022 29.9  26.0 - 34.0 pg Final   MCHC 02/07/2022 32.5  30.0 - 36.0 g/dL Final   RDW 25/85/2778 12.9  11.5 - 15.5 % Final   Platelets 02/07/2022 286  150 - 400 K/uL Final   nRBC 02/07/2022 0.0   0.0 - 0.2 % Final   Neutrophils Relative % 02/07/2022 41  % Final   Neutro Abs 02/07/2022 3.3  1.7 - 7.7 K/uL Final   Lymphocytes Relative 02/07/2022 53  % Final   Lymphs Abs 02/07/2022 4.3 (H)  0.7 - 4.0 K/uL Final   Monocytes Relative 02/07/2022 6  % Final   Monocytes Absolute 02/07/2022 0.5  0.1 - 1.0 K/uL Final   Eosinophils Relative 02/07/2022 0  % Final   Eosinophils Absolute 02/07/2022 0.0  0.0 - 0.5 K/uL Final   Basophils Relative 02/07/2022 0  % Final   Basophils Absolute 02/07/2022 0.0  0.0 - 0.1 K/uL Final   Immature Granulocytes 02/07/2022 0  % Final   Abs Immature Granulocytes 02/07/2022 0.02  0.00 - 0.07 K/uL Final   Performed at [facility], [address], [place], [place] [address]   Sodium 02/07/2022 137  135 - 145 mmol/L Final   Potassium 02/07/2022 4.1  3.5 - 5.1 mmol/L Final   Chloride 02/07/2022 104  98 - 111 mmol/L Final   CO2 02/07/2022 29  22 - 32 mmol/L Final   Glucose, Bld 02/07/2022 92  70 - 99 mg/dL Final   Glucose reference range applies only  to samples taken after fasting for at least 8 hours.   BUN 02/07/2022 10  6 - 20 mg/dL Final   Creatinine, Ser 02/07/2022 0.88  0.44 - 1.00 mg/dL Final   Calcium 16/07/9603 9.4  8.9 - 10.3 mg/dL Final   Total Protein 54/06/8118 7.2  6.5 - 8.1 g/dL Final   Albumin 14/78/2956 3.9  3.5 - 5.0 g/dL Final   AST 21/30/8657 18  15 - 41 U/L Final   ALT 02/07/2022 25  0 - 44 U/L Final   Alkaline Phosphatase 02/07/2022 66  38 - 126 U/L Final   Total Bilirubin 02/07/2022 0.2 (L)  0.3 - 1.2 mg/dL Final   GFR, Estimated 02/07/2022 >60  >60 mL/min Final   Comment: (NOTE) Calculated using the CKD-EPI Creatinine Equation (2021)    Anion gap 02/07/2022 4 (L)  5 - 15 Final   Performed at [facility], [address], [place], [place] [address]   Hgb A1c MFr Bld 02/07/2022 5.0  4.8 - 5.6 % Final   Comment: (NOTE) Pre diabetes:          5.7%-6.4%  Diabetes:              >6.4%  Glycemic control for   <7.0% adults  with diabetes    Mean Plasma Glucose 02/07/2022 96.8  mg/dL Final   Performed at [facility], [address], [place], [place] [address]   Alcohol, Ethyl (B) 02/07/2022 <10  <10 mg/dL Final   Comment: (NOTE) Lowest detectable limit for serum alcohol is 10 mg/dL.  For medical purposes only. Performed at [facility], [address] [place], [place] [address]    Cholesterol 02/07/2022 192  0 - 200 mg/dL Final   Triglycerides 40/07/2724 192 (H)  <150 mg/dL Final   HDL 36/64/4034 46  >40 mg/dL Final   Total CHOL/HDL Ratio 02/07/2022 4.2  RATIO Final   VLDL 02/07/2022 38  0 - 40 mg/dL Final   LDL Cholesterol 02/07/2022 108 (H)  0 - 99 mg/dL Final   Comment:        Total Cholesterol/HDL:CHD Risk Coronary Heart Disease Risk Table                     Men   Women  1/2 Average Risk   3.4   3.3  Average Risk       5.0   4.4  2 X Average Risk   9.6   7.1  3 X Average Risk  23.4   11.0        Use the calculated Patient Ratio above and the CHD Risk Table to determine the patient's CHD Risk.        ATP III CLASSIFICATION (LDL):  <100     mg/dL   Optimal  742-595  mg/dL   Near or Above                    Optimal  130-159  mg/dL   Borderline  638-756  mg/dL   High  >433     mg/dL   Very High Performed at [facility], [address], [place], [place] [address]    TSH 02/07/2022 6.344 (H)  0.350 - 4.500 uIU/mL Final   Comment: Performed by a 3rd Generation assay with a functional sensitivity of <=0.01 uIU/mL. Performed at [facility], [address], [place], [place] [address]    POC Amphetamine UR 02/07/2022  None Detected  NONE DETECTED (Cut Off Level 1000 ng/mL) Final   POC Secobarbital (BAR) 02/07/2022 None Detected  NONE DETECTED (Cut Off Level 300 ng/mL) Final   POC Buprenorphine (BUP) 02/07/2022 None Detected  NONE DETECTED (Cut Off Level 10 ng/mL) Final   POC Oxazepam (BZO) 02/07/2022 None Detected  NONE DETECTED (Cut Off Level 300 ng/mL) Final   POC  Cocaine UR 02/07/2022 None Detected  NONE DETECTED (Cut Off Level 300 ng/mL) Final   POC Methamphetamine UR 02/07/2022 None Detected  NONE DETECTED (Cut Off Level 1000 ng/mL) Final   POC Morphine 02/07/2022 None Detected  NONE DETECTED (Cut Off Level 300 ng/mL) Final   POC Oxycodone UR 02/07/2022 None Detected  NONE DETECTED (Cut Off Level 100 ng/mL) Final   POC Methadone UR 02/07/2022 None Detected  NONE DETECTED (Cut Off Level 300 ng/mL) Final   POC Marijuana UR 02/07/2022 None Detected  NONE DETECTED (Cut Off Level 50 ng/mL) Final   SARSCOV2ONAVIRUS 2 AG 02/07/2022 NEGATIVE  NEGATIVE Final   Comment: (NOTE) SARS-CoV-2 antigen NOT DETECTED.   Negative results are presumptive.  Negative results do not preclude SARS-CoV-2 infection and should not be used as the sole basis for treatment or other patient management decisions, including infection  control decisions, particularly in the presence of clinical signs and  symptoms consistent with COVID-19, or in those who have been in contact with the virus.  Negative results must be combined with clinical observations, patient history, and epidemiological information. The expected result is Negative.  Fact Sheet for Patients: https://www.jennings-kim.com/  Fact Sheet for Healthcare Providers: https://alexander-rogers.biz/  This test is not yet approved or cleared by the [place] FDA and  has been authorized for detection and/or diagnosis of SARS-CoV-2 by FDA under an Emergency Use Authorization (EUA).  This EUA will remain in effect (meaning this test can be used) for the duration of  the COV                          ID-19 declaration under Section 564(b)(1) of the Act, 21 U.S.C. section 360bbb-3(b)(1), unless the authorization is terminated or revoked sooner.     Preg Test, Ur 02/07/2022 NEGATIVE  NEGATIVE Final   Comment:        THE SENSITIVITY OF THIS METHODOLOGY IS >24 mIU/mL     Blood Alcohol level:   Lab Results  Component Value Date   ETH <10 07/04/2022   ETH <10 02/07/2022    Metabolic Disorder Labs: Lab Results  Component Value Date   HGBA1C 5.0 06/07/2022   MPG 96.8 06/07/2022   MPG 96.8 02/07/2022   No results found for: "PROLACTIN" Lab Results  Component Value Date   CHOL 181 07/25/2022   TRIG 118 07/25/2022   HDL 45 07/25/2022   CHOLHDL 4.0 07/25/2022   VLDL 24 07/25/2022   LDLCALC 112 (H) 07/25/2022   LDLCALC 88 06/07/2022    Therapeutic Lab Levels: No results found for: "LITHIUM" No results found for: "VALPROATE" No results found for: "CBMZ"  Physical Findings   GAD-7    Flowsheet Row [facility] Visit [facility]/17/2021 [facility]  Total GAD-7 Score 17      PHQ2-9    Flowsheet Row ED from 06/07/2022 in [facility] Office Visit from 12/17/2021 in [facility]  PHQ-2 Total Score 1 2  PHQ-9 Total Score 2 8      Flowsheet Row  ED from 07/25/2022 in [facility] ED from 07/04/2022 in [facility] from 06/14/2022 in [facility]   C-SSRS RISK CATEGORY High Risk No Risk No Risk        Musculoskeletal  Strength & Muscle Tone: within normal limits Gait & Station: normal Patient leans: N/A  Psychiatric Specialty Exam  Presentation  General Appearance:  Appropriate for Environment; Casual; Fairly Groomed  Eye Contact: Good  Speech: Clear and Coherent; Normal Rate  Speech Volume: Normal  Handedness: Right   Mood and Affect  Mood: Euthymic  Affect: Appropriate; Congruent; Full Range   Thought Process  Thought Processes: Coherent; Goal Directed; Linear  Descriptions of Associations:Intact  Orientation:Full (Time, Place and Person)  Thought Content:Logical; WDL  Diagnosis of Schizophrenia or Schizoaffective disorder in past: Yes    Hallucinations:Hallucinations:  None Description of Auditory Hallucinations: Denied Description of Visual Hallucinations: Denied  Ideas of Reference:None  Suicidal Thoughts:Suicidal Thoughts: No (Contracted to safety)  Homicidal Thoughts:Homicidal Thoughts: No   Sensorium  Memory: Immediate Good  Judgment: Fair  Insight: Fair   Art therapist  Concentration: Good  Attention Span: Good  Recall: Good  Fund of Knowledge: Good  Language: Good   Psychomotor Activity  Psychomotor Activity:Psychomotor Activity: Normal (AIMS 0)   Assets  Assets: Communication Skills; Desire for Improvement; Leisure Time   Sleep  Sleep:Sleep: Good  Physical Exam  Physical Exam Vitals and nursing note reviewed.  Constitutional:      General: She is not in acute distress.    Appearance: She is not ill-appearing or diaphoretic.  HENT:     Head: Normocephalic and atraumatic.  Pulmonary:     Effort: Pulmonary effort is normal. No respiratory distress.  Neurological:     General: No focal deficit present.     Mental Status: She is alert.   : AIMS 0 Review of Systems  Respiratory:  Negative for shortness of breath.   Cardiovascular:  Negative for chest pain.  Gastrointestinal:  Negative for abdominal pain, constipation, diarrhea, nausea and vomiting.  Musculoskeletal:  Positive for myalgias.  Neurological:  Negative for dizziness and headaches.   Blood pressure 120/82, pulse 98, temperature 97.8 F (36.6 C), temperature source Oral, resp. rate 18, SpO2 99 %. There is no height or weight on file to calculate BMI.  Treatment Plan Summary: [facility] team continues to seek placement for patient.   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, recurrent herpes infection, GERD, hypothyroidism   Current sch rx:  ARIPiprazole ER, 400 mg, Q28 days clotrimazole, , BID levothyroxine, 100 mcg, Q0600 nicotine, 14 mg, Q0600 ondansetron, 4 mg, Once Oxcarbazepine, 300 mg, BID pantoprazole, 20 mg,  Daily QUEtiapine, 400 mg, BID senna-docusate, 1 tablet, QHS sertraline, 150 mg, Daily valACYclovir, 500 mg, Daily   Dispo Tentative date: Unsure Barrier: Housing Received proof of guardianship  Pending fax of release of information from guardian   Patient continues to be psychiatric stable and ready for dispo pending placement availability   Clara Ellison Hughs, Student-PA 08/08/2022 11:41 AM  I was present for the entirety of the evaluation on 08/08/2022. I reviewed the patient's chart, and I participated in key portions of the service. I discussed the case with the PA student, and I agree with the assessment and plan of care as documented in the PA student's note.   Signed: Princess Bruins, DO Psychiatry Resident, PGY-2 [facility] 08/08/2022, 11:41 AM

## 2022-08-08 NOTE — ED Notes (Signed)
Nicole [name] affect brightens with approach and was overheard having a very good conversation with her daughter after which she focused on watching television.

## 2022-08-08 NOTE — ED Notes (Signed)
Patient resting quietly in bed with eyes closed. Respirations equal and unlabored, skin warm and dry, NAD. Routine safety checks conducted according to facility protocol. Will continue to monitor for safety.  

## 2022-08-08 NOTE — ED Notes (Signed)
Sleeping respirations are easy no disturbed sleep patterns note.

## 2022-08-09 ENCOUNTER — Other Ambulatory Visit: Payer: Self-pay

## 2022-08-09 DIAGNOSIS — F333 Major depressive disorder, recurrent, severe with psychotic symptoms: Secondary | ICD-10-CM | POA: Diagnosis not present

## 2022-08-09 NOTE — ED Notes (Signed)
Patient is calm and cooperative, watching TV no S/S of distress. Will continue to monitor for safety

## 2022-08-09 NOTE — ED Provider Notes (Signed)
Behavioral Health Progress Note  Date and Time: 08/09/2022 9:46 AM Name: Nicole [name] MRN:  [id number]  HPI: Nicole [name] is a 31 y.o. female, with PMH of SCZ unspecified, MDD with psychosis, who presented voluntary to [facility] (07/25/2022) via [facility] after a verbal altercation with [name] (not patient's legal guardian) for transient SI.  This is patient's fourth visit to [facility] for similar concerns this year. Patient has been dismissed from previous group home due to threatening SI and HI, then leaving the group home.   Legal Guardian: Mom (Maxine Pupukea) Point of contact: [name]) APS is following.   Subjective:   Patient was seen this AM, awake resting in bed comfortably.  Stated she still has uterine cramps, relieved with ibuprofen.  Reported that her myalgia has much improved.  Discussed with patient that team is still working on dispo.  Patient had no other questions or concerns and was amenable to plan below.  SI: Suicidal Thoughts: No (Contracted to safety) SI Active Intent and/or Plan:  (Denied) HI: Homicidal Thoughts: No HI Active Intent and/or Plan:  (Denied) HI Passive Intent and/or Plan:  (Denied) AVH: Hallucinations: None Description of Auditory Hallucinations: Denied Description of Visual Hallucinations: Denied Ideas of NFA:OZHY   Mood: Euthymic Sleep:Good Appetite: Good  Review of Systems  Respiratory:  Negative for shortness of breath.   Cardiovascular:  Negative for chest pain.  Gastrointestinal:  Negative for abdominal pain, constipation, diarrhea, nausea and vomiting.  Genitourinary:        Uterine cramps relieved with ibuprofen   Musculoskeletal:  Negative for myalgias.  Neurological:  Negative for dizziness and headaches.     Diagnosis:  Final diagnoses:  Severe episode of recurrent major depressive disorder, with psychotic features (HCC)    Total Time spent with patient: 15 minutes  Past Psychiatric History: SCZ unspecified, MDD with  psychosis, Anxiety. Past Medical History:  Past Medical History:  Diagnosis Date   Anxiety    Depression    Hypothyroidism 08/07/2022   Tobacco use disorder 08/07/2022    Past Surgical History:  Procedure Laterality Date   WISDOM TOOTH EXTRACTION Bilateral 2020   Family History:  Family History  Problem Relation Age of Onset   Hypertension Father    Diabetes Father    Family Psychiatric  History: None reported Social History:  Social History   Substance and Sexual Activity  Alcohol Use Never     Social History   Substance and Sexual Activity  Drug Use Never    Social History   Socioeconomic History   Marital status: Single    Spouse name: Not on file   Number of children: Not on file   Years of education: Not on file   Highest education level: Not on file  Occupational History   Not on file  Tobacco Use   Smoking status: Every Day    Types: Cigarettes   Smokeless tobacco: Never  Vaping Use   Vaping Use: Some days  Substance and Sexual Activity   Alcohol use: Never   Drug use: Never   Sexual activity: Yes    Partners: Female    Birth control/protection: Implant  Other Topics Concern   Not on file  Social History Narrative   Not on file   Social Determinants of Health   Financial Resource Strain: Not on file  Food Insecurity: Not on file  Transportation Needs: Not on file  Physical Activity: Not on file  Stress: Not on file  Social Connections: Not on  file   SDOH:  SDOH Screenings   Depression (PHQ2-9): Low Risk  (06/10/2022)  Tobacco Use: High Risk (08/07/2022)   Additional Social History:    Pain Medications: SEE MAR Prescriptions: SEE MAR Over the Counter: SEE MAR History of alcohol / drug use?: Yes Longest period of sobriety (when/how long): N/A Negative Consequences of Use:  (NONE) Withdrawal Symptoms: None Name of Substance 1: ETOH IN PAST--PT CURRENTLY DENIES 1 - Frequency: SOCIAL USE ETOH IN PAST 1 - Method of Aquiring:  LEGAL 1- Route of Use: ORAL DRINK Name of Substance 2: NICOTINE-VAPE AND CIGARETTES 2 - Amount (size/oz): LESS THAN ONE PACK 2 - Frequency: DAILY 2 - Method of Aquiring: LEGAL 2 - Route of Substance Use: ORAL SMOKE               Sleep: Fair  Appetite:  Good  Current Medications:  Current Facility-Administered Medications  Medication Dose Route Frequency Provider Last Rate Last Admin   acetaminophen (TYLENOL) tablet 650 mg  650 mg Oral Q6H PRN Onuoha, Chinwendu V, NP   650 mg at 08/08/22 2137   alum & mag hydroxide-simeth (MAALOX/MYLANTA) 200-200-20 MG/5ML suspension 30 mL  30 mL Oral Q4H PRN Onuoha, Chinwendu V, NP   30 mL at 07/27/22 2151   ARIPiprazole ER (ABILIFY MAINTENA) injection 400 mg  400 mg Intramuscular Q28 days Princess BruinsNguyen, Evamae Rowen, DO   400 mg at 08/02/22 1154   clotrimazole (LOTRIMIN) 1 % cream   Topical BID Bobbitt, Shalon E, NP   Given at 08/08/22 2126   hydrOXYzine (ATARAX) tablet 25 mg  25 mg Oral TID PRN Onuoha, Chinwendu V, NP   25 mg at 08/06/22 [id number]   ibuprofen (ADVIL) tablet 400 mg  400 mg Oral Q6H PRN Princess BruinsNguyen, Jahmari Esbenshade, DO   400 mg at 08/08/22 0854   levothyroxine (SYNTHROID) tablet 100 mcg  100 mcg Oral Q0600 Princess BruinsNguyen, Curtis Uriarte, DO   100 mcg at 08/09/22 [id number]   magnesium hydroxide (MILK OF MAGNESIA) suspension 30 mL  30 mL Oral Daily PRN Onuoha, Chinwendu V, NP   30 mL at 08/02/22 1956   nicotine (NICODERM CQ - dosed in mg/24 hours) patch 14 mg  14 mg Transdermal Q0600 Onuoha, Chinwendu V, NP   14 mg at 08/09/22 0807   ondansetron (ZOFRAN) tablet 4 mg  4 mg Oral Once Nwoko, Uchenna E, PA       Oxcarbazepine (TRILEPTAL) tablet 300 mg  300 mg Oral BID Onuoha, Chinwendu V, NP   300 mg at 08/09/22 0807   pantoprazole (PROTONIX) EC tablet 20 mg  20 mg Oral Daily Princess BruinsNguyen, Pedram Goodchild, DO   20 mg at 08/09/22 0807   QUEtiapine (SEROQUEL) tablet 400 mg  400 mg Oral BID Onuoha, Chinwendu V, NP   400 mg at 08/09/22 [id number]   senna-docusate (Senokot-S) tablet 1 tablet  1 tablet Oral QHS Princess BruinsNguyen,  Clair Bardwell, DO   1 tablet at 08/08/22 2126   sertraline (ZOLOFT) tablet 150 mg  150 mg Oral Daily Onuoha, Chinwendu V, NP   150 mg at 08/09/22 0807   traZODone (DESYREL) tablet 50 mg  50 mg Oral QHS PRN Onuoha, Chinwendu V, NP   50 mg at 08/08/22 2137   valACYclovir (VALTREX) tablet 500 mg  500 mg Oral Daily Princess BruinsNguyen, Tarron Krolak, DO   500 mg at 08/09/22 0808   Current Outpatient Medications  Medication Sig Dispense Refill   ABILIFY MAINTENA 400 MG PRSY prefilled syringe 400 mg every 28 (twenty-eight) days.     cetirizine (ZYRTEC)  10 MG tablet Take 10 mg by mouth daily.     cyclobenzaprine (FLEXERIL) 10 MG tablet Take 1 tablet (10 mg total) by mouth 2 (two) times daily as needed for muscle spasms. 20 tablet 0   fluticasone (FLONASE) 50 MCG/ACT nasal spray Place 1 spray into both nostrils daily.     hydrOXYzine (ATARAX) 50 MG tablet Take 1 tablet (50 mg total) by mouth 2 (two) times daily. 60 tablet 0   meloxicam (MOBIC) 15 MG tablet Take 15 mg by mouth daily.     Oxcarbazepine (TRILEPTAL) 300 MG tablet Take 1 tablet (300 mg total) by mouth 2 (two) times daily. 60 tablet 0   QUEtiapine (SEROQUEL) 400 MG tablet Take 1 tablet (400 mg total) by mouth 2 (two) times daily. 60 tablet 0   sertraline (ZOLOFT) 50 MG tablet Take 3 tablets (150 mg total) by mouth in the morning. 90 tablet 0   traZODone (DESYREL) 100 MG tablet Take 1 tablet (100 mg total) by mouth at bedtime. 30 tablet 0   valACYclovir (VALTREX) 500 MG tablet Take 500 mg by mouth daily.      Labs  Lab Results:  Admission on 07/25/2022  Component Date Value Ref Range Status   SARS Coronavirus 2 by RT PCR 07/25/2022 NEGATIVE  NEGATIVE Final   Comment: (NOTE) SARS-CoV-2 target nucleic acids are NOT DETECTED.  The SARS-CoV-2 RNA is generally detectable in upper respiratory specimens during the acute phase of infection. The lowest concentration of SARS-CoV-2 viral copies this assay can detect is 138 copies/mL. A negative result does not preclude  SARS-Cov-2 infection and should not be used as the sole basis for treatment or other patient management decisions. A negative result may occur with  improper specimen collection/handling, submission of specimen other than nasopharyngeal swab, presence of viral mutation(s) within the areas targeted by this assay, and inadequate number of viral copies(<138 copies/mL). A negative result must be combined with clinical observations, patient history, and epidemiological information. The expected result is Negative.  Fact Sheet for Patients:  BloggerCourse.com  Fact Sheet for Healthcare Providers:  SeriousBroker.it  This test is no                          t yet approved or cleared by the [place] FDA and  has been authorized for detection and/or diagnosis of SARS-CoV-2 by FDA under an Emergency Use Authorization (EUA). This EUA will remain  in effect (meaning this test can be used) for the duration of the COVID-19 declaration under Section 564(b)(1) of the Act, 21 U.S.C.section 360bbb-3(b)(1), unless the authorization is terminated  or revoked sooner.       Influenza A by PCR 07/25/2022 NEGATIVE  NEGATIVE Final   Influenza B by PCR 07/25/2022 NEGATIVE  NEGATIVE Final   Comment: (NOTE) The Xpert Xpress SARS-CoV-2/FLU/RSV plus assay is intended as an aid in the diagnosis of influenza from Nasopharyngeal swab specimens and should not be used as a sole basis for treatment. Nasal washings and aspirates are unacceptable for Xpert Xpress SARS-CoV-2/FLU/RSV testing.  Fact Sheet for Patients: BloggerCourse.com  Fact Sheet for Healthcare Providers: SeriousBroker.it  This test is not yet approved or cleared by the [place] FDA and has been authorized for detection and/or diagnosis of SARS-CoV-2 by FDA under an Emergency Use Authorization (EUA). This EUA will remain in effect  (meaning this test can be used) for the duration of the COVID-19 declaration under Section 564(b)(1) of the Act, 21  U.S.C. section 360bbb-3(b)(1), unless the authorization is terminated or revoked.  Performed at [facility], [address] [place], [place] [address]    WBC 07/25/2022 8.3  4.0 - 10.5 K/uL Final   RBC 07/25/2022 4.44  3.87 - 5.11 MIL/uL Final   Hemoglobin 07/25/2022 13.7  12.0 - 15.0 g/dL Final   HCT 69/48/5462 40.2  36.0 - 46.0 % Final   MCV 07/25/2022 90.5  80.0 - 100.0 fL Final   MCH 07/25/2022 30.9  26.0 - 34.0 pg Final   MCHC 07/25/2022 34.1  30.0 - 36.0 g/dL Final   RDW 70/35/0093 12.2  11.5 - 15.5 % Final   Platelets 07/25/2022 248  150 - 400 K/uL Final   nRBC 07/25/2022 0.0  0.0 - 0.2 % Final   Neutrophils Relative % 07/25/2022 43  % Final   Neutro Abs 07/25/2022 3.6  1.7 - 7.7 K/uL Final   Lymphocytes Relative 07/25/2022 52  % Final   Lymphs Abs 07/25/2022 4.2 (H)  0.7 - 4.0 K/uL Final   Monocytes Relative 07/25/2022 5  % Final   Monocytes Absolute 07/25/2022 0.4  0.1 - 1.0 K/uL Final   Eosinophils Relative 07/25/2022 0  % Final   Eosinophils Absolute 07/25/2022 0.0  0.0 - 0.5 K/uL Final   Basophils Relative 07/25/2022 0  % Final   Basophils Absolute 07/25/2022 0.0  0.0 - 0.1 K/uL Final   Immature Granulocytes 07/25/2022 0  % Final   Abs Immature Granulocytes 07/25/2022 0.02  0.00 - 0.07 K/uL Final   Performed at [facility], [address] [place], [place] [address]   Sodium 07/25/2022 138  135 - 145 mmol/L Final   Potassium 07/25/2022 4.0  3.5 - 5.1 mmol/L Final   Chloride 07/25/2022 104  98 - 111 mmol/L Final   CO2 07/25/2022 29  22 - 32 mmol/L Final   Glucose, Bld 07/25/2022 83  70 - 99 mg/dL Final   Glucose reference range applies only to samples taken after fasting for at least 8 hours.   BUN 07/25/2022 11  6 - 20 mg/dL Final   Creatinine, Ser 07/25/2022 0.97  0.44 - 1.00 mg/dL Final   Calcium 93/71/6967 9.2  8.9 - 10.3 mg/dL Final    Total Protein 07/25/2022 7.0  6.5 - 8.1 g/dL Final   Albumin 89/38/1017 3.8  3.5 - 5.0 g/dL Final   AST 51/11/5850 18  15 - 41 U/L Final   ALT 07/25/2022 22  0 - 44 U/L Final   Alkaline Phosphatase 07/25/2022 64  38 - 126 U/L Final   Total Bilirubin 07/25/2022 0.2 (L)  0.3 - 1.2 mg/dL Final   GFR, Estimated 07/25/2022 >60  >60 mL/min Final   Comment: (NOTE) Calculated using the CKD-EPI Creatinine Equation (2021)    Anion gap 07/25/2022 5  5 - 15 Final   Performed at [facility], [address], [place], [place] [address]   POC Amphetamine UR 07/25/2022 None Detected  NONE DETECTED (Cut Off Level 1000 ng/mL) Preliminary   POC Secobarbital (BAR) 07/25/2022 None Detected  NONE DETECTED (Cut Off Level 300 ng/mL) Preliminary   POC Buprenorphine (BUP) 07/25/2022 None Detected  NONE DETECTED (Cut Off Level 10 ng/mL) Preliminary   POC Oxazepam (BZO) 07/25/2022 None Detected  NONE DETECTED (Cut Off Level 300 ng/mL) Preliminary   POC Cocaine UR 07/25/2022 None Detected  NONE DETECTED (Cut Off Level 300 ng/mL) Preliminary   POC Methamphetamine UR 07/25/2022 None Detected  NONE  DETECTED (Cut Off Level 1000 ng/mL) Preliminary   POC Morphine 07/25/2022 None Detected  NONE DETECTED (Cut Off Level 300 ng/mL) Preliminary   POC Methadone UR 07/25/2022 None Detected  NONE DETECTED (Cut Off Level 300 ng/mL) Preliminary   POC Oxycodone UR 07/25/2022 None Detected  NONE DETECTED (Cut Off Level 100 ng/mL) Preliminary   POC Marijuana UR 07/25/2022 None Detected  NONE DETECTED (Cut Off Level 50 ng/mL) Preliminary   SARSCOV2ONAVIRUS 2 AG 07/25/2022 NEGATIVE  NEGATIVE Final   Comment: (NOTE) SARS-CoV-2 antigen NOT DETECTED.   Negative results are presumptive.  Negative results do not preclude SARS-CoV-2 infection and should not be used as the sole basis for treatment or other patient management decisions, including infection  control decisions, particularly in the presence of clinical signs and   symptoms consistent with COVID-19, or in those who have been in contact with the virus.  Negative results must be combined with clinical observations, patient history, and epidemiological information. The expected result is Negative.  Fact Sheet for Patients: https://www.jennings-kim.com/  Fact Sheet for Healthcare Providers: https://alexander-rogers.biz/  This test is not yet approved or cleared by the [place] FDA and  has been authorized for detection and/or diagnosis of SARS-CoV-2 by FDA under an Emergency Use Authorization (EUA).  This EUA will remain in effect (meaning this test can be used) for the duration of  the COV                          ID-19 declaration under Section 564(b)(1) of the Act, 21 U.S.C. section 360bbb-3(b)(1), unless the authorization is terminated or revoked sooner.     Cholesterol 07/25/2022 181  0 - 200 mg/dL Final   Triglycerides 16/07/9603 118  <150 mg/dL Final   HDL 54/06/8118 45  >40 mg/dL Final   Total CHOL/HDL Ratio 07/25/2022 4.0  RATIO Final   VLDL 07/25/2022 24  0 - 40 mg/dL Final   LDL Cholesterol 07/25/2022 112 (H)  0 - 99 mg/dL Final   Comment:        Total Cholesterol/HDL:CHD Risk Coronary Heart Disease Risk Table                     Men   Women  1/2 Average Risk   3.4   3.3  Average Risk       5.0   4.4  2 X Average Risk   9.6   7.1  3 X Average Risk  23.4   11.0        Use the calculated Patient Ratio above and the CHD Risk Table to determine the patient's CHD Risk.        ATP III CLASSIFICATION (LDL):  <100     mg/dL   Optimal  147-829  mg/dL   Near or Above                    Optimal  130-159  mg/dL   Borderline  562-130  mg/dL   High  >865     mg/dL   Very High Performed at [facility], [address] [place], [place] [address]    TSH 07/25/2022 6.668 (H)  0.350 - 4.500 uIU/mL Final   Comment: Performed by a 3rd Generation assay with a functional sensitivity of <=0.01  uIU/mL. Performed at [facility], [address] [place], [place] [address]    Glucose-Capillary 07/26/2022 104 (H)  70 - 99 mg/dL  Final   Glucose reference range applies only to samples taken after fasting for at least 8 hours.   T3, Free 07/31/2022 2.3  2.0 - 4.4 pg/mL Final   Comment: (NOTE) Performed At: [facility] [address] [place], [place] [id number] Jolene Schimke MD WU:[id number]    Free T4 07/31/2022 0.60 (L)  0.61 - 1.12 ng/dL Final   Comment: (NOTE) Biotin ingestion may interfere with free T4 tests. If the results are inconsistent with the TSH level, previous test results, or the clinical presentation, then consider biotin interference. If needed, order repeat testing after stopping biotin. Performed at [facility], [address] [place], [place] [address]   Admission on 07/04/2022, Discharged on 07/05/2022  Component Date Value Ref Range Status   Sodium 07/04/2022 142  135 - 145 mmol/L Final   Potassium 07/04/2022 4.4  3.5 - 5.1 mmol/L Final   Chloride 07/04/2022 109  98 - 111 mmol/L Final   CO2 07/04/2022 26  22 - 32 mmol/L Final   Glucose, Bld 07/04/2022 96  70 - 99 mg/dL Final   Glucose reference range applies only to samples taken after fasting for at least 8 hours.   BUN 07/04/2022 15  6 - 20 mg/dL Final   Creatinine, Ser 07/04/2022 0.83  0.44 - 1.00 mg/dL Final   Calcium 30/86/5784 9.3  8.9 - 10.3 mg/dL Final   Total Protein 69/62/9528 7.2  6.5 - 8.1 g/dL Final   Albumin 41/32/4401 3.9  3.5 - 5.0 g/dL Final   AST 02/72/5366 23  15 - 41 U/L Final   ALT 07/04/2022 28  0 - 44 U/L Final   Alkaline Phosphatase 07/04/2022 77  38 - 126 U/L Final   Total Bilirubin 07/04/2022 0.4  0.3 - 1.2 mg/dL Final   GFR, Estimated 07/04/2022 >60  >60 mL/min Final   Comment: (NOTE) Calculated using the CKD-EPI Creatinine Equation (2021)    Anion gap 07/04/2022 7  5 - 15 Final   Performed at [facility], [address]  [place], [place] [address]   Alcohol, Ethyl (B) 07/04/2022 <10  <10 mg/dL Final   Comment: (NOTE) Lowest detectable limit for serum alcohol is 10 mg/dL.  For medical purposes only. Performed at [facility], [address], [place], [place] [address]    WBC 07/04/2022 7.0  4.0 - 10.5 K/uL Final   RBC 07/04/2022 4.18  3.87 - 5.11 MIL/uL Final   Hemoglobin 07/04/2022 12.8  12.0 - 15.0 g/dL Final   HCT 56/38/7564 39.1  36.0 - 46.0 % Final   MCV 07/04/2022 93.5  80.0 - 100.0 fL Final   MCH 07/04/2022 30.6  26.0 - 34.0 pg Final   MCHC 07/04/2022 32.7  30.0 - 36.0 g/dL Final   RDW 33/29/5188 12.9  11.5 - 15.5 % Final   Platelets 07/04/2022 243  150 - 400 K/uL Final   nRBC 07/04/2022 0.0  0.0 - 0.2 % Final   Neutrophils Relative % 07/04/2022 43  % Final   Neutro Abs 07/04/2022 3.0  1.7 - 7.7 K/uL Final   Lymphocytes Relative 07/04/2022 50  % Final   Lymphs Abs 07/04/2022 3.5  0.7 - 4.0 K/uL Final   Monocytes Relative 07/04/2022 7  % Final   Monocytes Absolute 07/04/2022 0.5  0.1 - 1.0 K/uL Final   Eosinophils Relative 07/04/2022 0  % Final   Eosinophils Absolute 07/04/2022 0.0  0.0 - 0.5 K/uL Final   Basophils Relative 07/04/2022 0  %  Final   Basophils Absolute 07/04/2022 0.0  0.0 - 0.1 K/uL Final   Immature Granulocytes 07/04/2022 0  % Final   Abs Immature Granulocytes 07/04/2022 0.01  0.00 - 0.07 K/uL Final   Performed at [facility], [address], [place], [place] [address]   I-stat hCG, quantitative 07/04/2022 <5.0  <5 mIU/mL Final   Comment 3 07/04/2022          Final   Comment:   GEST. AGE      CONC.  (mIU/mL)   <=1 WEEK        5 - 50     2 WEEKS       50 - 500     3 WEEKS       100 - 10,000     4 WEEKS     1,000 - 30,000        FEMALE AND NON-PREGNANT FEMALE:     LESS THAN 5 mIU/mL   Admission on 06/14/2022, Discharged on 06/14/2022  Component Date Value Ref Range Status   Color, UA 06/14/2022 yellow  yellow Final   Clarity, UA 06/14/2022  cloudy (A)  clear Final   Glucose, UA 06/14/2022 negative  negative mg/dL Final   Bilirubin, UA 60/45/4098 negative  negative Final   Ketones, POC UA 06/14/2022 negative  negative mg/dL Final   Spec Grav, UA 11/91/4782 1.025  1.010 - 1.025 Final   Blood, UA 06/14/2022 negative  negative Final   pH, UA 06/14/2022 7.5  5.0 - 8.0 Final   Protein Ur, POC 06/14/2022 =30 (A)  negative mg/dL Final   Urobilinogen, UA 06/14/2022 0.2  0.2 or 1.0 E.U./dL Final   Nitrite, UA 95/62/1308 Negative  Negative Final   Leukocytes, UA 06/14/2022 Negative  Negative Final   Preg Test, Ur 06/14/2022 Negative  Negative Final   Specimen Description 06/14/2022 URINE, CLEAN CATCH   Final   Special Requests 06/14/2022    Final                   Value:NONE Performed at [facility], [address] [place], [place] [address]    Culture 06/14/2022 MULTIPLE SPECIES PRESENT, SUGGEST RECOLLECTION (A)   Final   Report Status 06/14/2022 06/16/2022 FINAL   Final  Admission on 06/07/2022, Discharged on 06/10/2022  Component Date Value Ref Range Status   SARS Coronavirus 2 by RT PCR 06/07/2022 NEGATIVE  NEGATIVE Final   Comment: (NOTE) SARS-CoV-2 target nucleic acids are NOT DETECTED.  The SARS-CoV-2 RNA is generally detectable in upper respiratory specimens during the acute phase of infection. The lowest concentration of SARS-CoV-2 viral copies this assay can detect is 138 copies/mL. A negative result does not preclude SARS-Cov-2 infection and should not be used as the sole basis for treatment or other patient management decisions. A negative result may occur with  improper specimen collection/handling, submission of specimen other than nasopharyngeal swab, presence of viral mutation(s) within the areas targeted by this assay, and inadequate number of viral copies(<138 copies/mL). A negative result must be combined with clinical observations, patient history, and epidemiological information. The expected result  is Negative.  Fact Sheet for Patients:  BloggerCourse.com  Fact Sheet for Healthcare Providers:  SeriousBroker.it  This test is no                          t yet approved or cleared by the [place] and  has been authorized  for detection and/or diagnosis of SARS-CoV-2 by FDA under an Emergency Use Authorization (EUA). This EUA will remain  in effect (meaning this test can be used) for the duration of the COVID-19 declaration under Section 564(b)(1) of the Act, 21 U.S.C.section 360bbb-3(b)(1), unless the authorization is terminated  or revoked sooner.       Influenza A by PCR 06/07/2022 NEGATIVE  NEGATIVE Final   Influenza B by PCR 06/07/2022 NEGATIVE  NEGATIVE Final   Comment: (NOTE) The Xpert Xpress SARS-CoV-2/FLU/RSV plus assay is intended as an aid in the diagnosis of influenza from Nasopharyngeal swab specimens and should not be used as a sole basis for treatment. Nasal washings and aspirates are unacceptable for Xpert Xpress SARS-CoV-2/FLU/RSV testing.  Fact Sheet for Patients: EntrepreneurPulse.com.au  Fact Sheet for Healthcare Providers: IncredibleEmployment.be  This test is not yet approved or cleared by the [place] FDA and has been authorized for detection and/or diagnosis of SARS-CoV-2 by FDA under an Emergency Use Authorization (EUA). This EUA will remain in effect (meaning this test can be used) for the duration of the COVID-19 declaration under Section 564(b)(1) of the Act, 21 U.S.C. section 360bbb-3(b)(1), unless the authorization is terminated or revoked.  Performed at [facility], [place] [address], [place], [place] [address]    WBC 06/07/2022 5.7  4.0 - 10.5 K/uL Final   RBC 06/07/2022 4.39  3.87 - 5.11 MIL/uL Final   Hemoglobin 06/07/2022 13.2  12.0 - 15.0 g/dL Final   HCT 06/07/2022 40.4  36.0 - 46.0 % Final   MCV 06/07/2022 92.0  80.0 - 100.0 fL  Final   MCH 06/07/2022 30.1  26.0 - 34.0 pg Final   MCHC 06/07/2022 32.7  30.0 - 36.0 g/dL Final   RDW 06/07/2022 12.4  11.5 - 15.5 % Final   Platelets 06/07/2022 308  150 - 400 K/uL Final   nRBC 06/07/2022 0.0  0.0 - 0.2 % Final   Neutrophils Relative % 06/07/2022 42  % Final   Neutro Abs 06/07/2022 2.4  1.7 - 7.7 K/uL Final   Lymphocytes Relative 06/07/2022 54  % Final   Lymphs Abs 06/07/2022 3.1  0.7 - 4.0 K/uL Final   Monocytes Relative 06/07/2022 4  % Final   Monocytes Absolute 06/07/2022 0.2  0.1 - 1.0 K/uL Final   Eosinophils Relative 06/07/2022 0  % Final   Eosinophils Absolute 06/07/2022 0.0  0.0 - 0.5 K/uL Final   Basophils Relative 06/07/2022 0  % Final   Basophils Absolute 06/07/2022 0.0  0.0 - 0.1 K/uL Final   Immature Granulocytes 06/07/2022 0  % Final   Abs Immature Granulocytes 06/07/2022 0.01  0.00 - 0.07 K/uL Final   Performed at [facility], [place] [address], [place], [place] [address]   Sodium 06/07/2022 139  135 - 145 mmol/L Final   Potassium 06/07/2022 4.0  3.5 - 5.1 mmol/L Final   Chloride 06/07/2022 104  98 - 111 mmol/L Final   CO2 06/07/2022 28  22 - 32 mmol/L Final   Glucose, Bld 06/07/2022 104 (H)  70 - 99 mg/dL Final   Glucose reference range applies only to samples taken after fasting for at least 8 hours.   BUN 06/07/2022 8  6 - 20 mg/dL Final   Creatinine, Ser 06/07/2022 0.84  0.44 - 1.00 mg/dL Final   Calcium 06/07/2022 9.1  8.9 - 10.3 mg/dL Final   Total Protein 06/07/2022 6.9  6.5 - 8.1 g/dL Final   Albumin 06/07/2022 3.7  3.5 - 5.0 g/dL Final   AST 40/98/1191 19  15 - 41 U/L Final   ALT 06/07/2022 22  0 - 44 U/L Final   Alkaline Phosphatase 06/07/2022 54  38 - 126 U/L Final   Total Bilirubin 06/07/2022 0.4  0.3 - 1.2 mg/dL Final   GFR, Estimated 06/07/2022 >60  >60 mL/min Final   Comment: (NOTE) Calculated using the CKD-EPI Creatinine Equation (2021)    Anion gap 06/07/2022 7  5 - 15 Final   Performed at [facility], [address] [place], [place] [address]   Hgb A1c MFr Bld 06/07/2022 5.0  4.8 - 5.6 % Final   Comment: (NOTE) Pre diabetes:          5.7%-6.4%  Diabetes:              >6.4%  Glycemic control for   <7.0% adults with diabetes    Mean Plasma Glucose 06/07/2022 96.8  mg/dL Final   Performed at [facility], [address], [place], [place] [address]   TSH 06/07/2022 1.620  0.350 - 4.500 uIU/mL Final   Comment: Performed by a 3rd Generation assay with a functional sensitivity of <=0.01 uIU/mL. Performed at [facility], [address] [place], [place] [address]    RPR Ser Ql 06/07/2022 NON REACTIVE  NON REACTIVE Final   Performed at [facility], [address] [place], [place] [address]   Color, Urine 06/07/2022 YELLOW  YELLOW Final   APPearance 06/07/2022 HAZY (A)  CLEAR Final   Specific Gravity, Urine 06/07/2022 1.018  1.005 - 1.030 Final   pH 06/07/2022 7.0  5.0 - 8.0 Final   Glucose, UA 06/07/2022 NEGATIVE  NEGATIVE mg/dL Final   Hgb urine dipstick 06/07/2022 NEGATIVE  NEGATIVE Final   Bilirubin Urine 06/07/2022 NEGATIVE  NEGATIVE Final   Ketones, ur 06/07/2022 NEGATIVE  NEGATIVE mg/dL Final   Protein, ur 29/52/8413 NEGATIVE  NEGATIVE mg/dL Final   Nitrite 24/40/1027 NEGATIVE  NEGATIVE Final   Leukocytes,Ua 06/07/2022 NEGATIVE  NEGATIVE Final   Performed at [facility], [address] [place], [place] [address]   Cholesterol 06/07/2022 164  0 - 200 mg/dL Final   Triglycerides 44/12/4740 158 (H)  <150 mg/dL Final   HDL 59/56/3875 44  >40 mg/dL Final   Total CHOL/HDL Ratio 06/07/2022 3.7  RATIO Final   VLDL 06/07/2022 32  0 - 40 mg/dL Final   LDL Cholesterol 06/07/2022 88  0 - 99 mg/dL Final   Comment:        Total Cholesterol/HDL:CHD Risk Coronary Heart Disease Risk Table                     Men   Women  1/2 Average Risk   3.4   3.3  Average Risk       5.0   4.4  2 X Average Risk   9.6   7.1  3 X Average Risk  23.4   11.0        Use the calculated Patient  Ratio above and the CHD Risk Table to determine the patient's CHD Risk.        ATP III CLASSIFICATION (LDL):  <100     mg/dL   Optimal  643-329  mg/dL   Near or Above                    Optimal  130-159  mg/dL  Borderline  160-189  mg/dL   High  >161     mg/dL   Very High Performed at [facility], [address] [place], [place] [address]    HIV Screen 4th Generation wRfx 06/07/2022 Non Reactive  Non Reactive Final   Performed at [facility], [address] [place], [place] [address]   SARSCOV2ONAVIRUS 2 AG 06/07/2022 NEGATIVE  NEGATIVE Final   Comment: (NOTE) SARS-CoV-2 antigen NOT DETECTED.   Negative results are presumptive.  Negative results do not preclude SARS-CoV-2 infection and should not be used as the sole basis for treatment or other patient management decisions, including infection  control decisions, particularly in the presence of clinical signs and  symptoms consistent with COVID-19, or in those who have been in contact with the virus.  Negative results must be combined with clinical observations, patient history, and epidemiological information. The expected result is Negative.  Fact Sheet for Patients: https://www.jennings-kim.com/  Fact Sheet for Healthcare Providers: https://alexander-rogers.biz/  This test is not yet approved or cleared by the [place] FDA and  has been authorized for detection and/or diagnosis of SARS-CoV-2 by FDA under an Emergency Use Authorization (EUA).  This EUA will remain in effect (meaning this test can be used) for the duration of  the COV                          ID-19 declaration under Section 564(b)(1) of the Act, 21 U.S.C. section 360bbb-3(b)(1), unless the authorization is terminated or revoked sooner.     POC Amphetamine UR 06/07/2022 None Detected  NONE DETECTED (Cut Off Level 1000 ng/mL) Final   POC Secobarbital (BAR) 06/07/2022 None Detected  NONE DETECTED (Cut Off Level  300 ng/mL) Final   POC Buprenorphine (BUP) 06/07/2022 None Detected  NONE DETECTED (Cut Off Level 10 ng/mL) Final   POC Oxazepam (BZO) 06/07/2022 None Detected  NONE DETECTED (Cut Off Level 300 ng/mL) Final   POC Cocaine UR 06/07/2022 None Detected  NONE DETECTED (Cut Off Level 300 ng/mL) Final   POC Methamphetamine UR 06/07/2022 None Detected  NONE DETECTED (Cut Off Level 1000 ng/mL) Final   POC Morphine 06/07/2022 None Detected  NONE DETECTED (Cut Off Level 300 ng/mL) Final   POC Methadone UR 06/07/2022 None Detected  NONE DETECTED (Cut Off Level 300 ng/mL) Final   POC Oxycodone UR 06/07/2022 None Detected  NONE DETECTED (Cut Off Level 100 ng/mL) Final   POC Marijuana UR 06/07/2022 None Detected  NONE DETECTED (Cut Off Level 50 ng/mL) Final   Preg Test, Ur 06/08/2022 NEGATIVE  NEGATIVE Final   Comment:        THE SENSITIVITY OF THIS METHODOLOGY IS >20 mIU/mL. Performed at [facility], [address] [place], [place] [address]    Preg Test, Ur 06/08/2022 NEGATIVE  NEGATIVE Final   Comment:        THE SENSITIVITY OF THIS METHODOLOGY IS >24 mIU/mL   Admission on 02/07/2022, Discharged on 02/08/2022  Component Date Value Ref Range Status   SARS Coronavirus 2 by RT PCR 02/07/2022 NEGATIVE  NEGATIVE Final   Comment: (NOTE) SARS-CoV-2 target nucleic acids are NOT DETECTED.  The SARS-CoV-2 RNA is generally detectable in upper respiratory specimens during the acute phase of infection. The lowest concentration of SARS-CoV-2 viral copies this assay can detect is 138 copies/mL. A negative result does not preclude SARS-Cov-2 infection and should not be used as the sole basis for treatment  or other patient management decisions. A negative result may occur with  improper specimen collection/handling, submission of specimen other than nasopharyngeal swab, presence of viral mutation(s) within the areas targeted by this assay, and inadequate number of viral copies(<138 copies/mL). A  negative result must be combined with clinical observations, patient history, and epidemiological information. The expected result is Negative.  Fact Sheet for Patients:  BloggerCourse.com  Fact Sheet for Healthcare Providers:  SeriousBroker.it  This test is no                          t yet approved or cleared by the [place] FDA and  has been authorized for detection and/or diagnosis of SARS-CoV-2 by FDA under an Emergency Use Authorization (EUA). This EUA will remain  in effect (meaning this test can be used) for the duration of the COVID-19 declaration under Section 564(b)(1) of the Act, 21 U.S.C.section 360bbb-3(b)(1), unless the authorization is terminated  or revoked sooner.       Influenza A by PCR 02/07/2022 NEGATIVE  NEGATIVE Final   Influenza B by PCR 02/07/2022 NEGATIVE  NEGATIVE Final   Comment: (NOTE) The Xpert Xpress SARS-CoV-2/FLU/RSV plus assay is intended as an aid in the diagnosis of influenza from Nasopharyngeal swab specimens and should not be used as a sole basis for treatment. Nasal washings and aspirates are unacceptable for Xpert Xpress SARS-CoV-2/FLU/RSV testing.  Fact Sheet for Patients: BloggerCourse.com  Fact Sheet for Healthcare Providers: SeriousBroker.it  This test is not yet approved or cleared by the [place] FDA and has been authorized for detection and/or diagnosis of SARS-CoV-2 by FDA under an Emergency Use Authorization (EUA). This EUA will remain in effect (meaning this test can be used) for the duration of the COVID-19 declaration under Section 564(b)(1) of the Act, 21 U.S.C. section 360bbb-3(b)(1), unless the authorization is terminated or revoked.  Performed at [facility], [address], [place], [place] [address]    WBC 02/07/2022 8.1  4.0 - 10.5 K/uL Final   RBC 02/07/2022 4.55  3.87 - 5.11 MIL/uL Final    Hemoglobin 02/07/2022 13.6  12.0 - 15.0 g/dL Final   HCT 85/63/1497 41.9  36.0 - 46.0 % Final   MCV 02/07/2022 92.1  80.0 - 100.0 fL Final   MCH 02/07/2022 29.9  26.0 - 34.0 pg Final   MCHC 02/07/2022 32.5  30.0 - 36.0 g/dL Final   RDW 02/63/7858 12.9  11.5 - 15.5 % Final   Platelets 02/07/2022 286  150 - 400 K/uL Final   nRBC 02/07/2022 0.0  0.0 - 0.2 % Final   Neutrophils Relative % 02/07/2022 41  % Final   Neutro Abs 02/07/2022 3.3  1.7 - 7.7 K/uL Final   Lymphocytes Relative 02/07/2022 53  % Final   Lymphs Abs 02/07/2022 4.3 (H)  0.7 - 4.0 K/uL Final   Monocytes Relative 02/07/2022 6  % Final   Monocytes Absolute 02/07/2022 0.5  0.1 - 1.0 K/uL Final   Eosinophils Relative 02/07/2022 0  % Final   Eosinophils Absolute 02/07/2022 0.0  0.0 - 0.5 K/uL Final   Basophils Relative 02/07/2022 0  % Final   Basophils Absolute 02/07/2022 0.0  0.0 - 0.1 K/uL Final   Immature Granulocytes 02/07/2022 0  % Final   Abs Immature Granulocytes 02/07/2022 0.02  0.00 - 0.07 K/uL Final   Performed at [facility], [address] [place], [place] [address]   Sodium 02/07/2022 137  135 - 145 mmol/L Final   Potassium 02/07/2022 4.1  3.5 - 5.1 mmol/L Final   Chloride 02/07/2022 104  98 - 111 mmol/L Final   CO2 02/07/2022 29  22 - 32 mmol/L Final   Glucose, Bld 02/07/2022 92  70 - 99 mg/dL Final   Glucose reference range applies only to samples taken after fasting for at least 8 hours.   BUN 02/07/2022 10  6 - 20 mg/dL Final   Creatinine, Ser 02/07/2022 0.88  0.44 - 1.00 mg/dL Final   Calcium 16/07/9603 9.4  8.9 - 10.3 mg/dL Final   Total Protein 54/06/8118 7.2  6.5 - 8.1 g/dL Final   Albumin 14/78/2956 3.9  3.5 - 5.0 g/dL Final   AST 21/30/8657 18  15 - 41 U/L Final   ALT 02/07/2022 25  0 - 44 U/L Final   Alkaline Phosphatase 02/07/2022 66  38 - 126 U/L Final   Total Bilirubin 02/07/2022 0.2 (L)  0.3 - 1.2 mg/dL Final   GFR, Estimated 02/07/2022 >60  >60 mL/min Final   Comment:  (NOTE) Calculated using the CKD-EPI Creatinine Equation (2021)    Anion gap 02/07/2022 4 (L)  5 - 15 Final   Performed at [facility], [address], [place], [place] [address]   Hgb A1c MFr Bld 02/07/2022 5.0  4.8 - 5.6 % Final   Comment: (NOTE) Pre diabetes:          5.7%-6.4%  Diabetes:              >6.4%  Glycemic control for   <7.0% adults with diabetes    Mean Plasma Glucose 02/07/2022 96.8  mg/dL Final   Performed at [facility], [address], [place], [place] [address]   Alcohol, Ethyl (B) 02/07/2022 <10  <10 mg/dL Final   Comment: (NOTE) Lowest detectable limit for serum alcohol is 10 mg/dL.  For medical purposes only. Performed at [facility], [address], [place], [place] [address]    Cholesterol 02/07/2022 192  0 - 200 mg/dL Final   Triglycerides 40/07/2724 192 (H)  <150 mg/dL Final   HDL 36/64/4034 46  >40 mg/dL Final   Total CHOL/HDL Ratio 02/07/2022 4.2  RATIO Final   VLDL 02/07/2022 38  0 - 40 mg/dL Final   LDL Cholesterol 02/07/2022 108 (H)  0 - 99 mg/dL Final   Comment:        Total Cholesterol/HDL:CHD Risk Coronary Heart Disease Risk Table                     Men   Women  1/2 Average Risk   3.4   3.3  Average Risk       5.0   4.4  2 X Average Risk   9.6   7.1  3 X Average Risk  23.4   11.0        Use the calculated Patient Ratio above and the CHD Risk Table to determine the patient's CHD Risk.        ATP III CLASSIFICATION (LDL):  <100     mg/dL   Optimal  742-595  mg/dL   Near or Above                    Optimal  130-159  mg/dL   Borderline  638-756  mg/dL   High  >433     mg/dL   Very High Performed at [facility], [address] [place], [place] [address]    TSH 02/07/2022 6.344 (H)  0.350 - 4.500 uIU/mL Final   Comment: Performed by a 3rd Generation assay with a functional sensitivity of <=0.01 uIU/mL. Performed at [facility], [address], [place], [place] [address]    POC Amphetamine UR  02/07/2022 None Detected  NONE DETECTED (Cut Off Level 1000 ng/mL) Final   POC Secobarbital (BAR) 02/07/2022 None Detected  NONE DETECTED (Cut Off Level 300 ng/mL) Final   POC Buprenorphine (BUP) 02/07/2022 None Detected  NONE DETECTED (Cut Off Level 10 ng/mL) Final   POC Oxazepam (BZO) 02/07/2022 None Detected  NONE DETECTED (Cut Off Level 300 ng/mL) Final   POC Cocaine UR 02/07/2022 None Detected  NONE DETECTED (Cut Off Level 300 ng/mL) Final   POC Methamphetamine UR 02/07/2022 None Detected  NONE DETECTED (Cut Off Level 1000 ng/mL) Final   POC Morphine 02/07/2022 None Detected  NONE DETECTED (Cut Off Level 300 ng/mL) Final   POC Oxycodone UR 02/07/2022 None Detected  NONE DETECTED (Cut Off Level 100 ng/mL) Final   POC Methadone UR 02/07/2022 None Detected  NONE DETECTED (Cut Off Level 300 ng/mL) Final   POC Marijuana UR 02/07/2022 None Detected  NONE DETECTED (Cut Off Level 50 ng/mL) Final   SARSCOV2ONAVIRUS 2 AG 02/07/2022 NEGATIVE  NEGATIVE Final   Comment: (NOTE) SARS-CoV-2 antigen NOT DETECTED.   Negative results are presumptive.  Negative results do not preclude SARS-CoV-2 infection and should not be used as the sole basis for treatment or other patient management decisions, including infection  control decisions, particularly in the presence of clinical signs and  symptoms consistent with COVID-19, or in those who have been in contact with the virus.  Negative results must be combined with clinical observations, patient history, and epidemiological information. The expected result is Negative.  Fact Sheet for Patients: https://www.jennings-kim.com/  Fact Sheet for Healthcare Providers: https://alexander-rogers.biz/  This test is not yet approved or cleared by the [place] FDA and  has been authorized for detection and/or diagnosis of SARS-CoV-2 by FDA under an Emergency Use Authorization (EUA).  This EUA will remain in effect (meaning this test can  be used) for the duration of  the COV                          ID-19 declaration under Section 564(b)(1) of the Act, 21 U.S.C. section 360bbb-3(b)(1), unless the authorization is terminated or revoked sooner.     Preg Test, Ur 02/07/2022 NEGATIVE  NEGATIVE Final   Comment:        THE SENSITIVITY OF THIS METHODOLOGY IS >24 mIU/mL     Blood Alcohol level:  Lab Results  Component Value Date   ETH <10 07/04/2022   ETH <10 02/07/2022    Metabolic Disorder Labs: Lab Results  Component Value Date   HGBA1C 5.0 06/07/2022   MPG 96.8 06/07/2022   MPG 96.8 02/07/2022   No results found for: "PROLACTIN" Lab Results  Component Value Date   CHOL 181 07/25/2022   TRIG 118 07/25/2022   HDL 45 07/25/2022   CHOLHDL 4.0 07/25/2022   VLDL 24 07/25/2022   LDLCALC 112 (H) 07/25/2022   LDLCALC 88 06/07/2022    Therapeutic Lab Levels: No results found for: "LITHIUM" No results found for: "VALPROATE" No results found for: "CBMZ"  Physical Findings   GAD-7    Flowsheet Row [facility] Visit [facility]/17/2021 in [facility]  Total GAD-7 Score 17      PHQ2-9    Flowsheet Row ED from 06/07/2022 in [facility] Office Visit from 12/17/2021 in [facility]  PHQ-2 Total Score 1 2  PHQ-9 Total Score 2 8      Flowsheet Row ED from 07/25/2022 in [facility] ED from 07/04/2022 in [facility] from 06/14/2022 in [facility]   C-SSRS RISK CATEGORY High Risk No Risk No Risk        Musculoskeletal  Strength & Muscle Tone: within normal limits Gait & Station: normal Patient leans: N/A  Psychiatric Specialty Exam  Presentation  General Appearance:  Appropriate for Environment; Casual; Fairly Groomed  Eye Contact: Good  Speech: Clear and Coherent; Normal Rate  Speech Volume: Normal  Handedness: Right   Mood and  Affect  Mood: Euthymic  Affect: Appropriate; Congruent; Full Range   Thought Process  Thought Processes: Coherent; Goal Directed; Linear  Descriptions of Associations:Intact  Orientation:Full (Time, Place and Person)  Thought Content:Logical; WDL  Diagnosis of Schizophrenia or Schizoaffective disorder in past: Yes    Hallucinations:Hallucinations: None Description of Auditory Hallucinations: Denied Description of Visual Hallucinations: Denied  Ideas of Reference:None  Suicidal Thoughts:Suicidal Thoughts: No (Contracted to safety)  Homicidal Thoughts:Homicidal Thoughts: No   Sensorium  Memory: Immediate Good  Judgment: Fair  Insight: Fair   Art therapist  Concentration: Good  Attention Span: Good  Recall: Good  Fund of Knowledge: Good  Language: Good   Psychomotor Activity  Psychomotor Activity:Psychomotor Activity: Normal (AIMS 0)   Assets  Assets: Communication Skills; Desire for Improvement; Leisure Time   Sleep  Sleep:Sleep: Good  Physical Exam  Blood pressure 116/77, pulse (!) 111, temperature 98.2 F (36.8 C), temperature source Oral, resp. rate 18, SpO2 100 %. There is no height or weight on file to calculate BMI.  Physical Exam Vitals and nursing note reviewed.  Constitutional:      General: She is awake. She is not in acute distress.    Appearance: She is not ill-appearing or diaphoretic.  HENT:     Head: Normocephalic.  Pulmonary:     Effort: Pulmonary effort is normal. No respiratory distress.  Neurological:     Mental Status: She is alert.      Treatment Plan Summary: [facility] team continues to seek placement for patient.   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, recurrent herpes infection, GERD, hypothyroidism   Current sch rx:  ARIPiprazole ER, 400 mg, Q28 days clotrimazole, , BID levothyroxine, 100 mcg, Q0600 nicotine, 14 mg, Q0600 ondansetron, 4 mg, Once Oxcarbazepine, 300 mg,  BID pantoprazole, 20 mg, Daily QUEtiapine, 400 mg, BID senna-docusate, 1 tablet, QHS sertraline, 150 mg, Daily valACYclovir, 500 mg, Daily   Dispo Tentative date: TBA Location: TBA Barrier: Housing, psychological testing Received proof of guardianship    Patient continues to be psychiatric stable and ready for dispo pending placement availability   Signed: Princess Bruins, DO Psychiatry Resident, PGY-2 [facility] 08/09/2022, 9:46 AM

## 2022-08-09 NOTE — ED Notes (Signed)
Pt resting in bed with no acute distress. Denies SI/HI/AVH. Social worker continues to look for placement.

## 2022-08-09 NOTE — ED Notes (Signed)
Patient resting quietly in bed with eyes closed. Respirations equal and unlabored, skin warm and dry, NAD. Routine safety checks conducted according to facility protocol. Will continue to monitor for safety.  

## 2022-08-10 NOTE — ED Notes (Signed)
Pt is sleeping. No distress noted. Will continue to monitor safety. 

## 2022-08-10 NOTE — ED Notes (Signed)
Pt is A&O x 4. Pt is lying on bed watching TV. Pt denies SI/HI/AVH. Will continue to monitor.

## 2022-08-10 NOTE — ED Notes (Signed)
Patient resting quietly in bed eyes closed. No S/S of distress. Will continue to monitor for safety.

## 2022-08-10 NOTE — ED Notes (Signed)
Pt sitting up in bed talking with peers.  Continues to be appropriate and redirectable.  No distress noted.

## 2022-08-10 NOTE — ED Notes (Signed)
Pt asleep in bed. Respirations even and unlabored. Monitoring for safety. 

## 2022-08-10 NOTE — ED Notes (Signed)
Pt given towels and hygiene supplies.

## 2022-08-10 NOTE — ED Provider Notes (Signed)
Behavioral Health Progress Note  Date and Time: 08/10/2022 8:57 AM Name: Nicole [name] MRN:  [id number]  Subjective:   Nicole [name] is a 30 y.o. female, with PMH of SCZ unspecified, MDD with psychosis, who presented voluntary to [facility] (07/25/2022) via [facility] after a verbal altercation with [name] (not patient's legal guardian) for transient SI.  This is patient's fourth visit to [facility] for similar concerns this year. Patient has been dismissed from previous group home due to threatening SI and HI, then leaving the group home.   Legal Guardian: Mom (Maxine Madison) Point of contact: [name]) APS is following.   On reassessment, pt is a&ox3, in no acute distress, non-toxic appearing. Pt denies SI/VI/HI, AVH,  paranoia. She denies any physical or psychiatric complaints.Pt remains psychiatrically cleared at this time.  Diagnosis:  Final diagnoses:  Severe episode of recurrent major depressive disorder, with psychotic features (HCC)    Total Time spent with patient: 15 minutes  Past Psychiatric History: SCZ unspecified, MDD with psychosis, Anxiety Past Medical History:  Past Medical History:  Diagnosis Date   Anxiety    Depression    Hypothyroidism 08/07/2022   Tobacco use disorder 08/07/2022    Past Surgical History:  Procedure Laterality Date   WISDOM TOOTH EXTRACTION Bilateral 2020   Family History:  Family History  Problem Relation Age of Onset   Hypertension Father    Diabetes Father    Family Psychiatric  History: None reported Social History:  Social History   Substance and Sexual Activity  Alcohol Use Never     Social History   Substance and Sexual Activity  Drug Use Never    Social History   Socioeconomic History   Marital status: Single    Spouse name: Not on file   Number of children: Not on file   Years of education: Not on file   Highest education level: Not on file  Occupational History   Not on file  Tobacco Use   Smoking status:  Every Day    Types: Cigarettes   Smokeless tobacco: Never  Vaping Use   Vaping Use: Some days  Substance and Sexual Activity   Alcohol use: Never   Drug use: Never   Sexual activity: Yes    Partners: Female    Birth control/protection: Implant  Other Topics Concern   Not on file  Social History Narrative   Not on file   Social Determinants of Health   Financial Resource Strain: Not on file  Food Insecurity: Not on file  Transportation Needs: Not on file  Physical Activity: Not on file  Stress: Not on file  Social Connections: Not on file   SDOH:  SDOH Screenings   Depression (PHQ2-9): Low Risk  (06/10/2022)  Tobacco Use: High Risk (08/07/2022)   Additional Social History:    Pain Medications: SEE MAR Prescriptions: SEE MAR Over the Counter: SEE MAR History of alcohol / drug use?: Yes Longest period of sobriety (when/how long): N/A Negative Consequences of Use:  (NONE) Withdrawal Symptoms: None Name of Substance 1: ETOH IN PAST--PT CURRENTLY DENIES 1 - Frequency: SOCIAL USE ETOH IN PAST 1 - Method of Aquiring: LEGAL 1- Route of Use: ORAL DRINK Name of Substance 2: NICOTINE-VAPE AND CIGARETTES 2 - Amount (size/oz): LESS THAN ONE PACK 2 - Frequency: DAILY 2 - Method of Aquiring: LEGAL 2 - Route of Substance Use: ORAL SMOKE                Sleep: Good  Appetite:  Good  Current Medications:  Current Facility-Administered Medications  Medication Dose Route Frequency Provider Last Rate Last Admin   acetaminophen (TYLENOL) tablet 650 mg  650 mg Oral Q6H PRN Onuoha, Chinwendu V, NP   650 mg at 08/08/22 2137   alum & mag hydroxide-simeth (MAALOX/MYLANTA) 200-200-20 MG/5ML suspension 30 mL  30 mL Oral Q4H PRN Onuoha, Chinwendu V, NP   30 mL at 07/27/22 2151   ARIPiprazole ER (ABILIFY MAINTENA) injection 400 mg  400 mg Intramuscular Q28 days Princess Bruins, DO   400 mg at 08/02/22 1154   clotrimazole (LOTRIMIN) 1 % cream   Topical BID Bobbitt, Shalon E, NP   1  Application at 08/09/22 2141   hydrOXYzine (ATARAX) tablet 25 mg  25 mg Oral TID PRN Onuoha, Chinwendu V, NP   25 mg at 08/06/22 4259   ibuprofen (ADVIL) tablet 400 mg  400 mg Oral Q6H PRN Princess Bruins, DO   400 mg at 08/08/22 0854   levothyroxine (SYNTHROID) tablet 100 mcg  100 mcg Oral Q0600 Princess Bruins, DO   100 mcg at 08/10/22 5638   magnesium hydroxide (MILK OF MAGNESIA) suspension 30 mL  30 mL Oral Daily PRN Onuoha, Chinwendu V, NP   30 mL at 08/02/22 1956   nicotine (NICODERM CQ - dosed in mg/24 hours) patch 14 mg  14 mg Transdermal Q0600 Onuoha, Chinwendu V, NP   14 mg at 08/09/22 0807   ondansetron (ZOFRAN) tablet 4 mg  4 mg Oral Once Nwoko, Uchenna E, PA       Oxcarbazepine (TRILEPTAL) tablet 300 mg  300 mg Oral BID Onuoha, Chinwendu V, NP   300 mg at 08/09/22 2142   pantoprazole (PROTONIX) EC tablet 20 mg  20 mg Oral Daily Princess Bruins, DO   20 mg at 08/09/22 0807   QUEtiapine (SEROQUEL) tablet 400 mg  400 mg Oral BID Onuoha, Chinwendu V, NP   400 mg at 08/09/22 2141   senna-docusate (Senokot-S) tablet 1 tablet  1 tablet Oral QHS Princess Bruins, DO   1 tablet at 08/09/22 2142   sertraline (ZOLOFT) tablet 150 mg  150 mg Oral Daily Onuoha, Chinwendu V, NP   150 mg at 08/09/22 0807   traZODone (DESYREL) tablet 50 mg  50 mg Oral QHS PRN Onuoha, Chinwendu V, NP   50 mg at 08/09/22 2141   valACYclovir (VALTREX) tablet 500 mg  500 mg Oral Daily Princess Bruins, DO   500 mg at 08/09/22 0808   Current Outpatient Medications  Medication Sig Dispense Refill   ABILIFY MAINTENA 400 MG PRSY prefilled syringe 400 mg every 28 (twenty-eight) days.     cetirizine (ZYRTEC) 10 MG tablet Take 10 mg by mouth daily.     cyclobenzaprine (FLEXERIL) 10 MG tablet Take 1 tablet (10 mg total) by mouth 2 (two) times daily as needed for muscle spasms. 20 tablet 0   fluticasone (FLONASE) 50 MCG/ACT nasal spray Place 1 spray into both nostrils daily.     hydrOXYzine (ATARAX) 50 MG tablet Take 1 tablet (50 mg total)  by mouth 2 (two) times daily. 60 tablet 0   meloxicam (MOBIC) 15 MG tablet Take 15 mg by mouth daily.     Oxcarbazepine (TRILEPTAL) 300 MG tablet Take 1 tablet (300 mg total) by mouth 2 (two) times daily. 60 tablet 0   QUEtiapine (SEROQUEL) 400 MG tablet Take 1 tablet (400 mg total) by mouth 2 (two) times daily. 60 tablet 0   sertraline (ZOLOFT) 50 MG  tablet Take 3 tablets (150 mg total) by mouth in the morning. 90 tablet 0   traZODone (DESYREL) 100 MG tablet Take 1 tablet (100 mg total) by mouth at bedtime. 30 tablet 0   valACYclovir (VALTREX) 500 MG tablet Take 500 mg by mouth daily.      Labs  Lab Results:  Admission on 07/25/2022  Component Date Value Ref Range Status   SARS Coronavirus 2 by RT PCR 07/25/2022 NEGATIVE  NEGATIVE Final   Comment: (NOTE) SARS-CoV-2 target nucleic acids are NOT DETECTED.  The SARS-CoV-2 RNA is generally detectable in upper respiratory specimens during the acute phase of infection. The lowest concentration of SARS-CoV-2 viral copies this assay can detect is 138 copies/mL. A negative result does not preclude SARS-Cov-2 infection and should not be used as the sole basis for treatment or other patient management decisions. A negative result may occur with  improper specimen collection/handling, submission of specimen other than nasopharyngeal swab, presence of viral mutation(s) within the areas targeted by this assay, and inadequate number of viral copies(<138 copies/mL). A negative result must be combined with clinical observations, patient history, and epidemiological information. The expected result is Negative.  Fact Sheet for Patients:  BloggerCourse.com  Fact Sheet for Healthcare Providers:  SeriousBroker.it  This test is no                          t yet approved or cleared by the [place] FDA and  has been authorized for detection and/or diagnosis of SARS-CoV-2 by FDA under an Emergency  Use Authorization (EUA). This EUA will remain  in effect (meaning this test can be used) for the duration of the COVID-19 declaration under Section 564(b)(1) of the Act, 21 U.S.C.section 360bbb-3(b)(1), unless the authorization is terminated  or revoked sooner.       Influenza A by PCR 07/25/2022 NEGATIVE  NEGATIVE Final   Influenza B by PCR 07/25/2022 NEGATIVE  NEGATIVE Final   Comment: (NOTE) The Xpert Xpress SARS-CoV-2/FLU/RSV plus assay is intended as an aid in the diagnosis of influenza from Nasopharyngeal swab specimens and should not be used as a sole basis for treatment. Nasal washings and aspirates are unacceptable for Xpert Xpress SARS-CoV-2/FLU/RSV testing.  Fact Sheet for Patients: BloggerCourse.com  Fact Sheet for Healthcare Providers: SeriousBroker.it  This test is not yet approved or cleared by the [place] FDA and has been authorized for detection and/or diagnosis of SARS-CoV-2 by FDA under an Emergency Use Authorization (EUA). This EUA will remain in effect (meaning this test can be used) for the duration of the COVID-19 declaration under Section 564(b)(1) of the Act, 21 U.S.C. section 360bbb-3(b)(1), unless the authorization is terminated or revoked.  Performed at [facility], [address] [place], [place] [address]    WBC 07/25/2022 8.3  4.0 - 10.5 K/uL Final   RBC 07/25/2022 4.44  3.87 - 5.11 MIL/uL Final   Hemoglobin 07/25/2022 13.7  12.0 - 15.0 g/dL Final   HCT 60/45/4098 40.2  36.0 - 46.0 % Final   MCV 07/25/2022 90.5  80.0 - 100.0 fL Final   MCH 07/25/2022 30.9  26.0 - 34.0 pg Final   MCHC 07/25/2022 34.1  30.0 - 36.0 g/dL Final   RDW 11/91/4782 12.2  11.5 - 15.5 % Final   Platelets 07/25/2022 248  150 - 400 K/uL Final   nRBC 07/25/2022 0.0  0.0 - 0.2 % Final   Neutrophils Relative % 07/25/2022 43  %  Final   Neutro Abs 07/25/2022 3.6  1.7 - 7.7 K/uL Final   Lymphocytes Relative  07/25/2022 52  % Final   Lymphs Abs 07/25/2022 4.2 (H)  0.7 - 4.0 K/uL Final   Monocytes Relative 07/25/2022 5  % Final   Monocytes Absolute 07/25/2022 0.4  0.1 - 1.0 K/uL Final   Eosinophils Relative 07/25/2022 0  % Final   Eosinophils Absolute 07/25/2022 0.0  0.0 - 0.5 K/uL Final   Basophils Relative 07/25/2022 0  % Final   Basophils Absolute 07/25/2022 0.0  0.0 - 0.1 K/uL Final   Immature Granulocytes 07/25/2022 0  % Final   Abs Immature Granulocytes 07/25/2022 0.02  0.00 - 0.07 K/uL Final   Performed at [facility], [address] [place], [place] [address]   Sodium 07/25/2022 138  135 - 145 mmol/L Final   Potassium 07/25/2022 4.0  3.5 - 5.1 mmol/L Final   Chloride 07/25/2022 104  98 - 111 mmol/L Final   CO2 07/25/2022 29  22 - 32 mmol/L Final   Glucose, Bld 07/25/2022 83  70 - 99 mg/dL Final   Glucose reference range applies only to samples taken after fasting for at least 8 hours.   BUN 07/25/2022 11  6 - 20 mg/dL Final   Creatinine, Ser 07/25/2022 0.97  0.44 - 1.00 mg/dL Final   Calcium 76/19/5093 9.2  8.9 - 10.3 mg/dL Final   Total Protein 26/71/2458 7.0  6.5 - 8.1 g/dL Final   Albumin 09/98/3382 3.8  3.5 - 5.0 g/dL Final   AST 50/53/9767 18  15 - 41 U/L Final   ALT 07/25/2022 22  0 - 44 U/L Final   Alkaline Phosphatase 07/25/2022 64  38 - 126 U/L Final   Total Bilirubin 07/25/2022 0.2 (L)  0.3 - 1.2 mg/dL Final   GFR, Estimated 07/25/2022 >60  >60 mL/min Final   Comment: (NOTE) Calculated using the CKD-EPI Creatinine Equation (2021)    Anion gap 07/25/2022 5  5 - 15 Final   Performed at [facility], [address] [place], [place] [address]   POC Amphetamine UR 07/25/2022 None Detected  NONE DETECTED (Cut Off Level 1000 ng/mL) Preliminary   POC Secobarbital (BAR) 07/25/2022 None Detected  NONE DETECTED (Cut Off Level 300 ng/mL) Preliminary   POC Buprenorphine (BUP) 07/25/2022 None Detected  NONE DETECTED (Cut Off Level 10 ng/mL) Preliminary   POC Oxazepam  (BZO) 07/25/2022 None Detected  NONE DETECTED (Cut Off Level 300 ng/mL) Preliminary   POC Cocaine UR 07/25/2022 None Detected  NONE DETECTED (Cut Off Level 300 ng/mL) Preliminary   POC Methamphetamine UR 07/25/2022 None Detected  NONE DETECTED (Cut Off Level 1000 ng/mL) Preliminary   POC Morphine 07/25/2022 None Detected  NONE DETECTED (Cut Off Level 300 ng/mL) Preliminary   POC Methadone UR 07/25/2022 None Detected  NONE DETECTED (Cut Off Level 300 ng/mL) Preliminary   POC Oxycodone UR 07/25/2022 None Detected  NONE DETECTED (Cut Off Level 100 ng/mL) Preliminary   POC Marijuana UR 07/25/2022 None Detected  NONE DETECTED (Cut Off Level 50 ng/mL) Preliminary   SARSCOV2ONAVIRUS 2 AG 07/25/2022 NEGATIVE  NEGATIVE Final   Comment: (NOTE) SARS-CoV-2 antigen NOT DETECTED.   Negative results are presumptive.  Negative results do not preclude SARS-CoV-2 infection and should not be used as the sole basis for treatment or other patient management decisions, including infection  control decisions, particularly in the presence of clinical signs and  symptoms consistent with COVID-19, or in those  who have been in contact with the virus.  Negative results must be combined with clinical observations, patient history, and epidemiological information. The expected result is Negative.  Fact Sheet for Patients: https://www.jennings-kim.com/  Fact Sheet for Healthcare Providers: https://alexander-rogers.biz/  This test is not yet approved or cleared by the [place] FDA and  has been authorized for detection and/or diagnosis of SARS-CoV-2 by FDA under an Emergency Use Authorization (EUA).  This EUA will remain in effect (meaning this test can be used) for the duration of  the COV                          ID-19 declaration under Section 564(b)(1) of the Act, 21 U.S.C. section 360bbb-3(b)(1), unless the authorization is terminated or revoked sooner.     Cholesterol 07/25/2022  181  0 - 200 mg/dL Final   Triglycerides 91/47/8295 118  <150 mg/dL Final   HDL 62/13/0865 45  >40 mg/dL Final   Total CHOL/HDL Ratio 07/25/2022 4.0  RATIO Final   VLDL 07/25/2022 24  0 - 40 mg/dL Final   LDL Cholesterol 07/25/2022 112 (H)  0 - 99 mg/dL Final   Comment:        Total Cholesterol/HDL:CHD Risk Coronary Heart Disease Risk Table                     Men   Women  1/2 Average Risk   3.4   3.3  Average Risk       5.0   4.4  2 X Average Risk   9.6   7.1  3 X Average Risk  23.4   11.0        Use the calculated Patient Ratio above and the CHD Risk Table to determine the patient's CHD Risk.        ATP III CLASSIFICATION (LDL):  <100     mg/dL   Optimal  784-696  mg/dL   Near or Above                    Optimal  130-159  mg/dL   Borderline  295-284  mg/dL   High  >132     mg/dL   Very High Performed at [facility], [address], [place], [place] [address]    TSH 07/25/2022 6.668 (H)  0.350 - 4.500 uIU/mL Final   Comment: Performed by a 3rd Generation assay with a functional sensitivity of <=0.01 uIU/mL. Performed at [facility], [address] [place], [place] [address]    Glucose-Capillary 07/26/2022 104 (H)  70 - 99 mg/dL Final   Glucose reference range applies only to samples taken after fasting for at least 8 hours.   T3, Free 07/31/2022 2.3  2.0 - 4.4 pg/mL Final   Comment: (NOTE) Performed At: [facility] [address] [place], [place] [id number] Jolene Schimke MD QV:[id number]    Free T4 07/31/2022 0.60 (L)  0.61 - 1.12 ng/dL Final   Comment: (NOTE) Biotin ingestion may interfere with free T4 tests. If the results are inconsistent with the TSH level, previous test results, or the clinical presentation, then consider biotin interference. If needed, order repeat testing after stopping biotin. Performed at [facility], [address] [place], [place] [address]   Admission on 07/04/2022, Discharged on 07/05/2022  Component  Date Value Ref Range Status   Sodium 07/04/2022 142  135 - 145 mmol/L Final  Potassium 07/04/2022 4.4  3.5 - 5.1 mmol/L Final   Chloride 07/04/2022 109  98 - 111 mmol/L Final   CO2 07/04/2022 26  22 - 32 mmol/L Final   Glucose, Bld 07/04/2022 96  70 - 99 mg/dL Final   Glucose reference range applies only to samples taken after fasting for at least 8 hours.   BUN 07/04/2022 15  6 - 20 mg/dL Final   Creatinine, Ser 07/04/2022 0.83  0.44 - 1.00 mg/dL Final   Calcium 16/07/9603 9.3  8.9 - 10.3 mg/dL Final   Total Protein 54/06/8118 7.2  6.5 - 8.1 g/dL Final   Albumin 14/78/2956 3.9  3.5 - 5.0 g/dL Final   AST 21/30/8657 23  15 - 41 U/L Final   ALT 07/04/2022 28  0 - 44 U/L Final   Alkaline Phosphatase 07/04/2022 77  38 - 126 U/L Final   Total Bilirubin 07/04/2022 0.4  0.3 - 1.2 mg/dL Final   GFR, Estimated 07/04/2022 >60  >60 mL/min Final   Comment: (NOTE) Calculated using the CKD-EPI Creatinine Equation (2021)    Anion gap 07/04/2022 7  5 - 15 Final   Performed at [facility], [address] [place], [place] [address]   Alcohol, Ethyl (B) 07/04/2022 <10  <10 mg/dL Final   Comment: (NOTE) Lowest detectable limit for serum alcohol is 10 mg/dL.  For medical purposes only. Performed at [facility], [address] [place], [place] [address]    WBC 07/04/2022 7.0  4.0 - 10.5 K/uL Final   RBC 07/04/2022 4.18  3.87 - 5.11 MIL/uL Final   Hemoglobin 07/04/2022 12.8  12.0 - 15.0 g/dL Final   HCT 41/32/4401 39.1  36.0 - 46.0 % Final   MCV 07/04/2022 93.5  80.0 - 100.0 fL Final   MCH 07/04/2022 30.6  26.0 - 34.0 pg Final   MCHC 07/04/2022 32.7  30.0 - 36.0 g/dL Final   RDW 02/72/5366 12.9  11.5 - 15.5 % Final   Platelets 07/04/2022 243  150 - 400 K/uL Final   nRBC 07/04/2022 0.0  0.0 - 0.2 % Final   Neutrophils Relative % 07/04/2022 43  % Final   Neutro Abs 07/04/2022 3.0  1.7 - 7.7 K/uL Final   Lymphocytes Relative 07/04/2022 50  % Final   Lymphs  Abs 07/04/2022 3.5  0.7 - 4.0 K/uL Final   Monocytes Relative 07/04/2022 7  % Final   Monocytes Absolute 07/04/2022 0.5  0.1 - 1.0 K/uL Final   Eosinophils Relative 07/04/2022 0  % Final   Eosinophils Absolute 07/04/2022 0.0  0.0 - 0.5 K/uL Final   Basophils Relative 07/04/2022 0  % Final   Basophils Absolute 07/04/2022 0.0  0.0 - 0.1 K/uL Final   Immature Granulocytes 07/04/2022 0  % Final   Abs Immature Granulocytes 07/04/2022 0.01  0.00 - 0.07 K/uL Final   Performed at [facility], [address] [place], [place] [address]   I-stat hCG, quantitative 07/04/2022 <5.0  <5 mIU/mL Final   Comment 3 07/04/2022          Final   Comment:   GEST. AGE      CONC.  (mIU/mL)   <=1 WEEK        5 - 50     2 WEEKS       50 - 500     3 WEEKS       100 - 10,000     4 WEEKS  1,000 - 30,000        FEMALE AND NON-PREGNANT FEMALE:     LESS THAN 5 mIU/mL   Admission on 06/14/2022, Discharged on 06/14/2022  Component Date Value Ref Range Status   Color, UA 06/14/2022 yellow  yellow Final   Clarity, UA 06/14/2022 cloudy (A)  clear Final   Glucose, UA 06/14/2022 negative  negative mg/dL Final   Bilirubin, UA 53/29/9242 negative  negative Final   Ketones, POC UA 06/14/2022 negative  negative mg/dL Final   Spec Grav, UA 68/34/1962 1.025  1.010 - 1.025 Final   Blood, UA 06/14/2022 negative  negative Final   pH, UA 06/14/2022 7.5  5.0 - 8.0 Final   Protein Ur, POC 06/14/2022 =30 (A)  negative mg/dL Final   Urobilinogen, UA 06/14/2022 0.2  0.2 or 1.0 E.U./dL Final   Nitrite, UA 22/97/9892 Negative  Negative Final   Leukocytes, UA 06/14/2022 Negative  Negative Final   Preg Test, Ur 06/14/2022 Negative  Negative Final   Specimen Description 06/14/2022 URINE, CLEAN CATCH   Final   Special Requests 06/14/2022    Final                   Value:NONE Performed at [facility], [address], [place], [place] [address]    Culture 06/14/2022 MULTIPLE SPECIES PRESENT, SUGGEST  RECOLLECTION (A)   Final   Report Status 06/14/2022 06/16/2022 FINAL   Final  Admission on 06/07/2022, Discharged on 06/10/2022  Component Date Value Ref Range Status   SARS Coronavirus 2 by RT PCR 06/07/2022 NEGATIVE  NEGATIVE Final   Comment: (NOTE) SARS-CoV-2 target nucleic acids are NOT DETECTED.  The SARS-CoV-2 RNA is generally detectable in upper respiratory specimens during the acute phase of infection. The lowest concentration of SARS-CoV-2 viral copies this assay can detect is 138 copies/mL. A negative result does not preclude SARS-Cov-2 infection and should not be used as the sole basis for treatment or other patient management decisions. A negative result may occur with  improper specimen collection/handling, submission of specimen other than nasopharyngeal swab, presence of viral mutation(s) within the areas targeted by this assay, and inadequate number of viral copies(<138 copies/mL). A negative result must be combined with clinical observations, patient history, and epidemiological information. The expected result is Negative.  Fact Sheet for Patients:  BloggerCourse.com  Fact Sheet for Healthcare Providers:  SeriousBroker.it  This test is no                          t yet approved or cleared by the [place] FDA and  has been authorized for detection and/or diagnosis of SARS-CoV-2 by FDA under an Emergency Use Authorization (EUA). This EUA will remain  in effect (meaning this test can be used) for the duration of the COVID-19 declaration under Section 564(b)(1) of the Act, 21 U.S.C.section 360bbb-3(b)(1), unless the authorization is terminated  or revoked sooner.       Influenza A by PCR 06/07/2022 NEGATIVE  NEGATIVE Final   Influenza B by PCR 06/07/2022 NEGATIVE  NEGATIVE Final   Comment: (NOTE) The Xpert Xpress SARS-CoV-2/FLU/RSV plus assay is intended as an aid in the diagnosis of influenza from  Nasopharyngeal swab specimens and should not be used as a sole basis for treatment. Nasal washings and aspirates are unacceptable for Xpert Xpress SARS-CoV-2/FLU/RSV testing.  Fact Sheet for Patients: BloggerCourse.com  Fact Sheet for Healthcare Providers: SeriousBroker.it  This test is not yet approved or  cleared by the [place] and has been authorized for detection and/or diagnosis of SARS-CoV-2 by FDA under an Emergency Use Authorization (EUA). This EUA will remain in effect (meaning this test can be used) for the duration of the COVID-19 declaration under Section 564(b)(1) of the Act, 21 U.S.C. section 360bbb-3(b)(1), unless the authorization is terminated or revoked.  Performed at [facility], [address], [place], [place] [address]    WBC 06/07/2022 5.7  4.0 - 10.5 K/uL Final   RBC 06/07/2022 4.39  3.87 - 5.11 MIL/uL Final   Hemoglobin 06/07/2022 13.2  12.0 - 15.0 g/dL Final   HCT 35/45/6256 40.4  36.0 - 46.0 % Final   MCV 06/07/2022 92.0  80.0 - 100.0 fL Final   MCH 06/07/2022 30.1  26.0 - 34.0 pg Final   MCHC 06/07/2022 32.7  30.0 - 36.0 g/dL Final   RDW 38/93/7342 12.4  11.5 - 15.5 % Final   Platelets 06/07/2022 308  150 - 400 K/uL Final   nRBC 06/07/2022 0.0  0.0 - 0.2 % Final   Neutrophils Relative % 06/07/2022 42  % Final   Neutro Abs 06/07/2022 2.4  1.7 - 7.7 K/uL Final   Lymphocytes Relative 06/07/2022 54  % Final   Lymphs Abs 06/07/2022 3.1  0.7 - 4.0 K/uL Final   Monocytes Relative 06/07/2022 4  % Final   Monocytes Absolute 06/07/2022 0.2  0.1 - 1.0 K/uL Final   Eosinophils Relative 06/07/2022 0  % Final   Eosinophils Absolute 06/07/2022 0.0  0.0 - 0.5 K/uL Final   Basophils Relative 06/07/2022 0  % Final   Basophils Absolute 06/07/2022 0.0  0.0 - 0.1 K/uL Final   Immature Granulocytes 06/07/2022 0  % Final   Abs Immature Granulocytes 06/07/2022 0.01  0.00 - 0.07 K/uL Final   Performed at  [facility], [address], [place], [place] [address]   Sodium 06/07/2022 139  135 - 145 mmol/L Final   Potassium 06/07/2022 4.0  3.5 - 5.1 mmol/L Final   Chloride 06/07/2022 104  98 - 111 mmol/L Final   CO2 06/07/2022 28  22 - 32 mmol/L Final   Glucose, Bld 06/07/2022 104 (H)  70 - 99 mg/dL Final   Glucose reference range applies only to samples taken after fasting for at least 8 hours.   BUN 06/07/2022 8  6 - 20 mg/dL Final   Creatinine, Ser 06/07/2022 0.84  0.44 - 1.00 mg/dL Final   Calcium 15/72/6203 9.1  8.9 - 10.3 mg/dL Final   Total Protein 55/97/4163 6.9  6.5 - 8.1 g/dL Final   Albumin 84/53/6468 3.7  3.5 - 5.0 g/dL Final   AST 01/02/2247 19  15 - 41 U/L Final   ALT 06/07/2022 22  0 - 44 U/L Final   Alkaline Phosphatase 06/07/2022 54  38 - 126 U/L Final   Total Bilirubin 06/07/2022 0.4  0.3 - 1.2 mg/dL Final   GFR, Estimated 06/07/2022 >60  >60 mL/min Final   Comment: (NOTE) Calculated using the CKD-EPI Creatinine Equation (2021)    Anion gap 06/07/2022 7  5 - 15 Final   Performed at [facility], [address] [place], [place] [address]   Hgb A1c MFr Bld 06/07/2022 5.0  4.8 - 5.6 % Final   Comment: (NOTE) Pre diabetes:          5.7%-6.4%  Diabetes:              >6.4%  Glycemic control for   <7.0% adults with diabetes    Mean Plasma Glucose 06/07/2022 96.8  mg/dL Final   Performed at [place] [address] [place], [place] [address]   TSH 06/07/2022 1.620  0.350 - 4.500 uIU/mL Final   Comment: Performed by a 3rd Generation assay with a functional sensitivity of <=0.01 uIU/mL. Performed at [facility], [place] [address], [place], [place] [address]    RPR Ser Ql 06/07/2022 NON REACTIVE  NON REACTIVE Final   Performed at [facility], [place] [address], [place], [place] [address]   Color, Urine 06/07/2022 YELLOW  YELLOW Final   APPearance 06/07/2022 HAZY (A)  CLEAR Final   Specific Gravity, Urine 06/07/2022 1.018  1.005 - 1.030 Final    pH 06/07/2022 7.0  5.0 - 8.0 Final   Glucose, UA 06/07/2022 NEGATIVE  NEGATIVE mg/dL Final   Hgb urine dipstick 06/07/2022 NEGATIVE  NEGATIVE Final   Bilirubin Urine 06/07/2022 NEGATIVE  NEGATIVE Final   Ketones, ur 06/07/2022 NEGATIVE  NEGATIVE mg/dL Final   Protein, ur 06/07/2022 NEGATIVE  NEGATIVE mg/dL Final   Nitrite 06/07/2022 NEGATIVE  NEGATIVE Final   Leukocytes,Ua 06/07/2022 NEGATIVE  NEGATIVE Final   Performed at [facility], [place] [address] [place], [place] [address]   Cholesterol 06/07/2022 164  0 - 200 mg/dL Final   Triglycerides 06/07/2022 158 (H)  <150 mg/dL Final   HDL 06/07/2022 44  >40 mg/dL Final   Total CHOL/HDL Ratio 06/07/2022 3.7  RATIO Final   VLDL 06/07/2022 32  0 - 40 mg/dL Final   LDL Cholesterol 06/07/2022 88  0 - 99 mg/dL Final   Comment:        Total Cholesterol/HDL:CHD Risk Coronary Heart Disease Risk Table                     Men   Women  1/2 Average Risk   3.4   3.3  Average Risk       5.0   4.4  2 X Average Risk   9.6   7.1  3 X Average Risk  23.4   11.0        Use the calculated Patient Ratio above and the CHD Risk Table to determine the patient's CHD Risk.        ATP III CLASSIFICATION (LDL):  <100     mg/dL   Optimal  100-129  mg/dL   Near or Above                    Optimal  130-159  mg/dL   Borderline  160-189  mg/dL   High  >190     mg/dL   Very High Performed at [place] [address], [place], [place] [address]    HIV Screen 4th Generation wRfx 06/07/2022 Non Reactive  Non Reactive Final   Performed at [facility], [place] [address], [place],  [address]   SARSCOV2ONAVIRUS 2 AG 06/07/2022 NEGATIVE  NEGATIVE Final   Comment: (NOTE) SARS-CoV-2 antigen NOT DETECTED.   Negative results are presumptive.  Negative results do not preclude SARS-CoV-2 infection and should not be used as the sole basis for treatment or other patient management decisions, including infection  control decisions, particularly  in the presence of clinical signs and  symptoms consistent with COVID-19, or in those who have been in contact with the virus.  Negative results must be combined with  clinical observations, patient history, and epidemiological information. The expected result is Negative.  Fact Sheet for Patients: https://www.jennings-kim.com/  Fact Sheet for Healthcare Providers: https://alexander-rogers.biz/  This test is not yet approved or cleared by the [place] FDA and  has been authorized for detection and/or diagnosis of SARS-CoV-2 by FDA under an Emergency Use Authorization (EUA).  This EUA will remain in effect (meaning this test can be used) for the duration of  the COV                          ID-19 declaration under Section 564(b)(1) of the Act, 21 U.S.C. section 360bbb-3(b)(1), unless the authorization is terminated or revoked sooner.     POC Amphetamine UR 06/07/2022 None Detected  NONE DETECTED (Cut Off Level 1000 ng/mL) Final   POC Secobarbital (BAR) 06/07/2022 None Detected  NONE DETECTED (Cut Off Level 300 ng/mL) Final   POC Buprenorphine (BUP) 06/07/2022 None Detected  NONE DETECTED (Cut Off Level 10 ng/mL) Final   POC Oxazepam (BZO) 06/07/2022 None Detected  NONE DETECTED (Cut Off Level 300 ng/mL) Final   POC Cocaine UR 06/07/2022 None Detected  NONE DETECTED (Cut Off Level 300 ng/mL) Final   POC Methamphetamine UR 06/07/2022 None Detected  NONE DETECTED (Cut Off Level 1000 ng/mL) Final   POC Morphine 06/07/2022 None Detected  NONE DETECTED (Cut Off Level 300 ng/mL) Final   POC Methadone UR 06/07/2022 None Detected  NONE DETECTED (Cut Off Level 300 ng/mL) Final   POC Oxycodone UR 06/07/2022 None Detected  NONE DETECTED (Cut Off Level 100 ng/mL) Final   POC Marijuana UR 06/07/2022 None Detected  NONE DETECTED (Cut Off Level 50 ng/mL) Final   Preg Test, Ur 06/08/2022 NEGATIVE  NEGATIVE Final   Comment:        THE SENSITIVITY OF THIS METHODOLOGY IS >20  mIU/mL. Performed at [facility], [address] [place], [place] [address]    Preg Test, Ur 06/08/2022 NEGATIVE  NEGATIVE Final   Comment:        THE SENSITIVITY OF THIS METHODOLOGY IS >24 mIU/mL     Blood Alcohol level:  Lab Results  Component Value Date   ETH <10 07/04/2022   ETH <10 02/07/2022    Metabolic Disorder Labs: Lab Results  Component Value Date   HGBA1C 5.0 06/07/2022   MPG 96.8 06/07/2022   MPG 96.8 02/07/2022   No results found for: "PROLACTIN" Lab Results  Component Value Date   CHOL 181 07/25/2022   TRIG 118 07/25/2022   HDL 45 07/25/2022   CHOLHDL 4.0 07/25/2022   VLDL 24 07/25/2022   LDLCALC 112 (H) 07/25/2022   LDLCALC 88 06/07/2022    Therapeutic Lab Levels: No results found for: "LITHIUM" No results found for: "VALPROATE" No results found for: "CBMZ"  Physical Findings   GAD-7    Flowsheet Row Office Visit [facility]/17/2021 in [facility]  Total GAD-7 Score 17      PHQ2-9    Flowsheet Row ED from 06/07/2022 in [facility] Office Visit from 12/17/2021 in [facility]  PHQ-2 Total Score 1 2  PHQ-9 Total Score 2 8      Flowsheet Row ED from 07/25/2022 in [facility] ED from 07[facility]04/2022 in [facility] from 06/14/2022 in [facility]   C-SSRS RISK CATEGORY High Risk No  Risk No Risk        Musculoskeletal  Strength & Muscle Tone: within normal limits Gait & Station: normal Patient leans: N/A  Psychiatric Specialty Exam  Presentation  General Appearance:  Appropriate for Environment; Casual; Fairly Groomed  Eye Contact: Good  Speech: Clear and Coherent; Normal Rate  Speech Volume: Normal  Handedness: Right   Mood and Affect  Mood: Euthymic  Affect: Appropriate; Congruent; Full Range   Thought Process  Thought Processes: Coherent; Goal  Directed; Linear  Descriptions of Associations:Intact  Orientation:Full (Time, Place and Person)  Thought Content:Logical; WDL  Diagnosis of Schizophrenia or Schizoaffective disorder in past: Yes    Hallucinations:Hallucinations: None  Ideas of Reference:None  Suicidal Thoughts:Suicidal Thoughts: No  Homicidal Thoughts:Homicidal Thoughts: No   Sensorium  Memory: Immediate Good  Judgment: Fair  Insight: Fair   Executive Functions  Concentration: Good  Attention Span: Good  Recall: Good  Fund of Knowledge: Good  Language: Good   Psychomotor Activity  Psychomotor Activity: Psychomotor Activity: Normal   Assets  Assets: Communication Skills; Desire for Improvement; Leisure Time   Sleep  Sleep: Sleep: Good   No data recorded  Physical Exam  Physical Exam Constitutional:      General: She is not in acute distress.    Appearance: Normal appearance. She is not ill-appearing, toxic-appearing or diaphoretic.  Pulmonary:     Effort: Pulmonary effort is normal. No respiratory distress.  Skin:    Coloration: Skin is not jaundiced or pale.  Neurological:     Mental Status: She is alert and oriented to person, place, and time.  Psychiatric:        Attention and Perception: Attention and perception normal.        Mood and Affect: Mood and affect normal.        Speech: Speech normal.        Behavior: Behavior normal.        Thought Content: Thought content normal.    Review of Systems  Constitutional:  Negative for chills and fever.  Respiratory:  Negative for shortness of breath.   Cardiovascular:  Negative for chest pain and palpitations.  Gastrointestinal:  Negative for abdominal pain.  Neurological:  Negative for headaches.  Psychiatric/Behavioral: Negative.     Blood pressure 108/85, pulse (!) 107, temperature 98.2 F (36.8 C), temperature source Oral, resp. rate 18, SpO2 100 %. There is no height or weight on file to calculate  BMI.  Treatment Plan Summary: Plan Pt continues to be psychiatrically stable. Dispo is pending.  Lauree ChandlerJacqueline Eun Rosselyn Martha, NP 08/10/2022 8:57 AM

## 2022-08-11 NOTE — ED Notes (Signed)
Pt is sleeping. No distress noted. Will continue to monitor safety. 

## 2022-08-11 NOTE — ED Provider Notes (Signed)
Behavioral Health Progress Note  Date and Time: 08/11/2022 8:34 AM Name: Nicole [name] Nikolic MRN:  [id number]  Subjective:   Nicole [name] is a 31 y.o. female, with PMH of SCZ unspecified, MDD with psychosis, who [facility] via [facility] after a verbal altercation with Jeannett SeniorJosephine Okeke (not patient's legal guardian) for transient SI.  This [facility] this year. Patient has been dismissed from previous group home due to threatening SI and HI, then leaving the group home.   Legal Guardian: Mom (Maxine LeipsicBrown) Point of contact: [name](Albert Brown) APS is following.   On reassessment, pt is a&ox3, in on acute distress, non-toxic appearing. Pt denies SI/VI/HI, AVH, paranoia. She denies any physical or psychiatric complaints. She verbalized understanding that dispo continues to be pending. Pt remains psychiatrically cleared at this time.  Diagnosis:  Final diagnoses:  Severe episode of recurrent major depressive disorder, with psychotic features (HCC)    Total Time spent with patient: 15 minutes  Past Psychiatric History: SCZ unspecified, MDD with psychosis, Anxiety Past Medical History:  Past Medical History:  Diagnosis Date   Anxiety    Depression    Hypothyroidism 08/07/2022   Tobacco use disorder 08/07/2022    Past Surgical History:  Procedure Laterality Date   WISDOM TOOTH EXTRACTION Bilateral 2020   Family History:  Family History  Problem Relation Age of Onset   Hypertension Father    Diabetes Father    Family Psychiatric  History: None reported Social History:  Social History   Substance and Sexual Activity  Alcohol Use Never     Social History   Substance and Sexual Activity  Drug Use Never    Social History   Socioeconomic History   Marital status: Single    Spouse name: Not on file   Number of children: Not on file   Years of education: Not on file   Highest education level: Not on file   Occupational History   Not on file  Tobacco Use   Smoking status: Every Day    Types: Cigarettes   Smokeless tobacco: Never  Vaping Use   Vaping Use: Some days  Substance and Sexual Activity   Alcohol use: Never   Drug use: Never   Sexual activity: Yes    Partners: Female    Birth control/protection: Implant  Other Topics Concern   Not on file  Social History Narrative   Not on file   Social Determinants of Health   Financial Resource Strain: Not on file  Food Insecurity: Not on file  Transportation Needs: Not on file  Physical Activity: Not on file  Stress: Not on file  Social Connections: Not on file   SDOH:  SDOH Screenings   Depression (PHQ2-9): Low Risk  (06/10/2022)  Tobacco Use: High Risk (08/07/2022)   Additional Social History:    Pain Medications: SEE MAR Prescriptions: SEE MAR Over the Counter: SEE MAR History of alcohol / drug use?: Yes Longest period of sobriety (when/how long): N/A Negative Consequences of Use:  (NONE) Withdrawal Symptoms: None Name of Substance 1: ETOH IN PAST--PT CURRENTLY DENIES 1 - Frequency: SOCIAL USE ETOH IN PAST 1 - Method of Aquiring: LEGAL 1- Route of Use: ORAL DRINK Name of Substance 2: NICOTINE-VAPE AND CIGARETTES 2 - Amount (size/oz): LESS THAN ONE PACK 2 - Frequency: DAILY 2 - Method of Aquiring: LEGAL 2 - Route of Substance Use: ORAL SMOKE  Sleep: Good  Appetite:  Good  Current Medications:  Current Facility-Administered Medications  Medication Dose Route Frequency Provider Last Rate Last Admin   acetaminophen (TYLENOL) tablet 650 mg  650 mg Oral Q6H PRN Onuoha, Chinwendu V, NP   650 mg at 08/08/22 2137   alum & mag hydroxide-simeth (MAALOX/MYLANTA) 200-200-20 MG/5ML suspension 30 mL  30 mL Oral Q4H PRN Onuoha, Chinwendu V, NP   30 mL at 07/27/22 2151   ARIPiprazole ER (ABILIFY MAINTENA) injection 400 mg  400 mg Intramuscular Q28 days Princess Bruins, DO   400 mg at 08/02/22 1154    clotrimazole (LOTRIMIN) 1 % cream   Topical BID Bobbitt, Shalon E, NP   1 Application at 08/10/22 2106   hydrOXYzine (ATARAX) tablet 25 mg  25 mg Oral TID PRN Onuoha, Chinwendu V, NP   25 mg at 08/10/22 0912   ibuprofen (ADVIL) tablet 400 mg  400 mg Oral Q6H PRN Princess Bruins, DO   400 mg at 08/08/22 0854   levothyroxine (SYNTHROID) tablet 100 mcg  100 mcg Oral Q0600 Princess Bruins, DO   100 mcg at 08/11/22 0604   magnesium hydroxide (MILK OF MAGNESIA) suspension 30 mL  30 mL Oral Daily PRN Onuoha, Chinwendu V, NP   30 mL at 08/02/22 1956   nicotine (NICODERM CQ - dosed in mg/24 hours) patch 14 mg  14 mg Transdermal Q0600 Onuoha, Chinwendu V, NP   14 mg at 08/10/22 0911   ondansetron (ZOFRAN) tablet 4 mg  4 mg Oral Once Nwoko, Uchenna E, PA       Oxcarbazepine (TRILEPTAL) tablet 300 mg  300 mg Oral BID Onuoha, Chinwendu V, NP   300 mg at 08/10/22 2106   pantoprazole (PROTONIX) EC tablet 20 mg  20 mg Oral Daily Princess Bruins, DO   20 mg at 08/10/22 0911   QUEtiapine (SEROQUEL) tablet 400 mg  400 mg Oral BID Onuoha, Chinwendu V, NP   400 mg at 08/10/22 2106   senna-docusate (Senokot-S) tablet 1 tablet  1 tablet Oral QHS Princess Bruins, DO   1 tablet at 08/10/22 2106   sertraline (ZOLOFT) tablet 150 mg  150 mg Oral Daily Onuoha, Chinwendu V, NP   150 mg at 08/10/22 0911   traZODone (DESYREL) tablet 50 mg  50 mg Oral QHS PRN Onuoha, Chinwendu V, NP   50 mg at 08/10/22 2106   valACYclovir (VALTREX) tablet 500 mg  500 mg Oral Daily Princess Bruins, DO   500 mg at 08/10/22 1829   Current Outpatient Medications  Medication Sig Dispense Refill   ABILIFY MAINTENA 400 MG PRSY prefilled syringe 400 mg every 28 (twenty-eight) days.     cetirizine (ZYRTEC) 10 MG tablet Take 10 mg by mouth daily.     cyclobenzaprine (FLEXERIL) 10 MG tablet Take 1 tablet (10 mg total) by mouth 2 (two) times daily as needed for muscle spasms. 20 tablet 0   fluticasone (FLONASE) 50 MCG/ACT nasal spray Place 1 spray into both nostrils  daily.     hydrOXYzine (ATARAX) 50 MG tablet Take 1 tablet (50 mg total) by mouth 2 (two) times daily. 60 tablet 0   meloxicam (MOBIC) 15 MG tablet Take 15 mg by mouth daily.     Oxcarbazepine (TRILEPTAL) 300 MG tablet Take 1 tablet (300 mg total) by mouth 2 (two) times daily. 60 tablet 0   QUEtiapine (SEROQUEL) 400 MG tablet Take 1 tablet (400 mg total) by mouth 2 (two) times daily. 60 tablet 0   sertraline (  ZOLOFT) 50 MG tablet Take 3 tablets (150 mg total) by mouth in the morning. 90 tablet 0   traZODone (DESYREL) 100 MG tablet Take 1 tablet (100 mg total) by mouth at bedtime. 30 tablet 0   valACYclovir (VALTREX) 500 MG tablet Take 500 mg by mouth daily.      Labs  Lab Results:  Admission on 07/25/2022  Component Date Value Ref Range Status   SARS Coronavirus 2 by RT PCR 07/25/2022 NEGATIVE  NEGATIVE Final   Comment: (NOTE) SARS-CoV-2 target nucleic acids are NOT DETECTED.  The SARS-CoV-2 RNA is generally detectable in upper respiratory specimens during the acute phase of infection. The lowest concentration of SARS-CoV-2 viral copies this assay can detect is 138 copies/mL. A negative result does not preclude SARS-Cov-2 infection and should not be used as the sole basis for treatment or other patient management decisions. A negative result may occur with  improper specimen collection/handling, submission of specimen other than nasopharyngeal swab, presence of viral mutation(s) within the areas targeted by this assay, and inadequate number of viral copies(<138 copies/mL). A negative result must be combined with clinical observations, patient history, and epidemiological information. The expected result is Negative.  Fact Sheet for Patients:  EntrepreneurPulse.com.au  Fact Sheet for Healthcare Providers:  IncredibleEmployment.be  This test is no                          t yet approved or cleared by the [place] FDA and  has been  authorized for detection and/or diagnosis of SARS-CoV-2 by FDA under an Emergency Use Authorization (EUA). This EUA will remain  in effect (meaning this test can be used) for the duration of the COVID-19 declaration under Section 564(b)(1) of the Act, 21 U.S.C.section 360bbb-3(b)(1), unless the authorization is terminated  or revoked sooner.       Influenza A by PCR 07/25/2022 NEGATIVE  NEGATIVE Final   Influenza B by PCR 07/25/2022 NEGATIVE  NEGATIVE Final   Comment: (NOTE) The Xpert Xpress SARS-CoV-2/FLU/RSV plus assay is intended as an aid in the diagnosis of influenza from Nasopharyngeal swab specimens and should not be used as a sole basis for treatment. Nasal washings and aspirates are unacceptable for Xpert Xpress SARS-CoV-2/FLU/RSV testing.  Fact Sheet for Patients: EntrepreneurPulse.com.au  Fact Sheet for Healthcare Providers: IncredibleEmployment.be  This test is not yet approved or cleared by the [place] FDA and has been authorized for detection and/or diagnosis of SARS-CoV-2 by FDA under an Emergency Use Authorization (EUA). This EUA will remain in effect (meaning this test can be used) for the duration of the COVID-19 declaration under Section 564(b)(1) of the Act, 21 U.S.C. section 360bbb-3(b)(1), unless the authorization is terminated or revoked.  Performed at [facility], [place] [address] [place], [place] [address]    WBC 07/25/2022 8.3  4.0 - 10.5 K/uL Final   RBC 07/25/2022 4.44  3.87 - 5.11 MIL/uL Final   Hemoglobin 07/25/2022 13.7  12.0 - 15.0 g/dL Final   HCT 07/25/2022 40.2  36.0 - 46.0 % Final   MCV 07/25/2022 90.5  80.0 - 100.0 fL Final   MCH 07/25/2022 30.9  26.0 - 34.0 pg Final   MCHC 07/25/2022 34.1  30.0 - 36.0 g/dL Final   RDW 07/25/2022 12.2  11.5 - 15.5 % Final   Platelets 07/25/2022 248  150 - 400 K/uL Final   nRBC 07/25/2022 0.0  0.0 - 0.2 % Final   Neutrophils Relative %  07/25/2022 43  %  Final   Neutro Abs 07/25/2022 3.6  1.7 - 7.7 K/uL Final   Lymphocytes Relative 07/25/2022 52  % Final   Lymphs Abs 07/25/2022 4.2 (H)  0.7 - 4.0 K/uL Final   Monocytes Relative 07/25/2022 5  % Final   Monocytes Absolute 07/25/2022 0.4  0.1 - 1.0 K/uL Final   Eosinophils Relative 07/25/2022 0  % Final   Eosinophils Absolute 07/25/2022 0.0  0.0 - 0.5 K/uL Final   Basophils Relative 07/25/2022 0  % Final   Basophils Absolute 07/25/2022 0.0  0.0 - 0.1 K/uL Final   Immature Granulocytes 07/25/2022 0  % Final   Abs Immature Granulocytes 07/25/2022 0.02  0.00 - 0.07 K/uL Final   Performed at [facility], [address], [place], [place] [address]   Sodium 07/25/2022 138  135 - 145 mmol/L Final   Potassium 07/25/2022 4.0  3.5 - 5.1 mmol/L Final   Chloride 07/25/2022 104  98 - 111 mmol/L Final   CO2 07/25/2022 29  22 - 32 mmol/L Final   Glucose, Bld 07/25/2022 83  70 - 99 mg/dL Final   Glucose reference range applies only to samples taken after fasting for at least 8 hours.   BUN 07/25/2022 11  6 - 20 mg/dL Final   Creatinine, Ser 07/25/2022 0.97  0.44 - 1.00 mg/dL Final   Calcium 50/06/3817 9.2  8.9 - 10.3 mg/dL Final   Total Protein 29/93/7169 7.0  6.5 - 8.1 g/dL Final   Albumin 67/89/3810 3.8  3.5 - 5.0 g/dL Final   AST 17/51/0258 18  15 - 41 U/L Final   ALT 07/25/2022 22  0 - 44 U/L Final   Alkaline Phosphatase 07/25/2022 64  38 - 126 U/L Final   Total Bilirubin 07/25/2022 0.2 (L)  0.3 - 1.2 mg/dL Final   GFR, Estimated 07/25/2022 >60  >60 mL/min Final   Comment: (NOTE) Calculated using the CKD-EPI Creatinine Equation (2021)    Anion gap 07/25/2022 5  5 - 15 Final   Performed at [facility], [address] [place], [place] [address]   POC Amphetamine UR 07/25/2022 None Detected  NONE DETECTED (Cut Off Level 1000 ng/mL) Preliminary   POC Secobarbital (BAR) 07/25/2022 None Detected  NONE DETECTED (Cut Off Level 300 ng/mL) Preliminary   POC Buprenorphine (BUP) 07/25/2022  None Detected  NONE DETECTED (Cut Off Level 10 ng/mL) Preliminary   POC Oxazepam (BZO) 07/25/2022 None Detected  NONE DETECTED (Cut Off Level 300 ng/mL) Preliminary   POC Cocaine UR 07/25/2022 None Detected  NONE DETECTED (Cut Off Level 300 ng/mL) Preliminary   POC Methamphetamine UR 07/25/2022 None Detected  NONE DETECTED (Cut Off Level 1000 ng/mL) Preliminary   POC Morphine 07/25/2022 None Detected  NONE DETECTED (Cut Off Level 300 ng/mL) Preliminary   POC Methadone UR 07/25/2022 None Detected  NONE DETECTED (Cut Off Level 300 ng/mL) Preliminary   POC Oxycodone UR 07/25/2022 None Detected  NONE DETECTED (Cut Off Level 100 ng/mL) Preliminary   POC Marijuana UR 07/25/2022 None Detected  NONE DETECTED (Cut Off Level 50 ng/mL) Preliminary   SARSCOV2ONAVIRUS 2 AG 07/25/2022 NEGATIVE  NEGATIVE Final   Comment: (NOTE) SARS-CoV-2 antigen NOT DETECTED.   Negative results are presumptive.  Negative results do not preclude SARS-CoV-2 infection and should not be used as the sole basis for treatment or other patient management decisions, including infection  control decisions, particularly in the presence of clinical signs and  symptoms consistent with  COVID-19, or in those who have been in contact with the virus.  Negative results must be combined with clinical observations, patient history, and epidemiological information. The expected result is Negative.  Fact Sheet for Patients: https://www.jennings-kim.com/  Fact Sheet for Healthcare Providers: https://alexander-rogers.biz/  This test is not yet approved or cleared by the [place] FDA and  has been authorized for detection and/or diagnosis of SARS-CoV-2 by FDA under an Emergency Use Authorization (EUA).  This EUA will remain in effect (meaning this test can be used) for the duration of  the COV                          ID-19 declaration under Section 564(b)(1) of the Act, 21 U.S.C. section 360bbb-3(b)(1), unless  the authorization is terminated or revoked sooner.     Cholesterol 07/25/2022 181  0 - 200 mg/dL Final   Triglycerides 08/65/7846 118  <150 mg/dL Final   HDL 96/29/5284 45  >40 mg/dL Final   Total CHOL/HDL Ratio 07/25/2022 4.0  RATIO Final   VLDL 07/25/2022 24  0 - 40 mg/dL Final   LDL Cholesterol 07/25/2022 112 (H)  0 - 99 mg/dL Final   Comment:        Total Cholesterol/HDL:CHD Risk Coronary Heart Disease Risk Table                     Men   Women  1/2 Average Risk   3.4   3.3  Average Risk       5.0   4.4  2 X Average Risk   9.6   7.1  3 X Average Risk  23.4   11.0        Use the calculated Patient Ratio above and the CHD Risk Table to determine the patient's CHD Risk.        ATP III CLASSIFICATION (LDL):  <100     mg/dL   Optimal  132-440  mg/dL   Near or Above                    Optimal  130-159  mg/dL   Borderline  102-725  mg/dL   High  >366     mg/dL   Very High Performed at [facility], [address], [place], [place] [address]    TSH 07/25/2022 6.668 (H)  0.350 - 4.500 uIU/mL Final   Comment: Performed by a 3rd Generation assay with a functional sensitivity of <=0.01 uIU/mL. Performed at [facility], [address], [place], [place] [address]    Glucose-Capillary 07/26/2022 104 (H)  70 - 99 mg/dL Final   Glucose reference range applies only to samples taken after fasting for at least 8 hours.   T3, Free 07/31/2022 2.3  2.0 - 4.4 pg/mL Final   Comment: (NOTE) Performed At: [facility] [address] [place], [place] [id number] Jolene Schimke MD PI:[id number]    Free T4 07/31/2022 0.60 (L)  0.61 - 1.12 ng/dL Final   Comment: (NOTE) Biotin ingestion may interfere with free T4 tests. If the results are inconsistent with the TSH level, previous test results, or the clinical presentation, then consider biotin interference. If needed, order repeat testing after stopping biotin. Performed at [facility], [address]  [place], [place] [address]   Admission on 07/04/2022, Discharged on 07/05/2022  Component Date Value Ref Range Status   Sodium 07/04/2022 142  135 -  145 mmol/L Final   Potassium 07/04/2022 4.4  3.5 - 5.1 mmol/L Final   Chloride 07/04/2022 109  98 - 111 mmol/L Final   CO2 07/04/2022 26  22 - 32 mmol/L Final   Glucose, Bld 07/04/2022 96  70 - 99 mg/dL Final   Glucose reference range applies only to samples taken after fasting for at least 8 hours.   BUN 07/04/2022 15  6 - 20 mg/dL Final   Creatinine, Ser 07/04/2022 0.83  0.44 - 1.00 mg/dL Final   Calcium 16/07/9603 9.3  8.9 - 10.3 mg/dL Final   Total Protein 54/06/8118 7.2  6.5 - 8.1 g/dL Final   Albumin 14/78/2956 3.9  3.5 - 5.0 g/dL Final   AST 21/30/8657 23  15 - 41 U/L Final   ALT 07/04/2022 28  0 - 44 U/L Final   Alkaline Phosphatase 07/04/2022 77  38 - 126 U/L Final   Total Bilirubin 07/04/2022 0.4  0.3 - 1.2 mg/dL Final   GFR, Estimated 07/04/2022 >60  >60 mL/min Final   Comment: (NOTE) Calculated using the CKD-EPI Creatinine Equation (2021)    Anion gap 07/04/2022 7  5 - 15 Final   Performed at [facility], [address], [place], [place] [address]   Alcohol, Ethyl (B) 07/04/2022 <10  <10 mg/dL Final   Comment: (NOTE) Lowest detectable limit for serum alcohol is 10 mg/dL.  For medical purposes only. Performed at [facility], [address], [place], [place] [address]    WBC 07/04/2022 7.0  4.0 - 10.5 K/uL Final   RBC 07/04/2022 4.18  3.87 - 5.11 MIL/uL Final   Hemoglobin 07/04/2022 12.8  12.0 - 15.0 g/dL Final   HCT 41/32/4401 39.1  36.0 - 46.0 % Final   MCV 07/04/2022 93.5  80.0 - 100.0 fL Final   MCH 07/04/2022 30.6  26.0 - 34.0 pg Final   MCHC 07/04/2022 32.7  30.0 - 36.0 g/dL Final   RDW 02/72/5366 12.9  11.5 - 15.5 % Final   Platelets 07/04/2022 243  150 - 400 K/uL Final   nRBC 07/04/2022 0.0  0.0 - 0.2 % Final   Neutrophils Relative % 07/04/2022 43  % Final   Neutro Abs  07/04/2022 3.0  1.7 - 7.7 K/uL Final   Lymphocytes Relative 07/04/2022 50  % Final   Lymphs Abs 07/04/2022 3.5  0.7 - 4.0 K/uL Final   Monocytes Relative 07/04/2022 7  % Final   Monocytes Absolute 07/04/2022 0.5  0.1 - 1.0 K/uL Final   Eosinophils Relative 07/04/2022 0  % Final   Eosinophils Absolute 07/04/2022 0.0  0.0 - 0.5 K/uL Final   Basophils Relative 07/04/2022 0  % Final   Basophils Absolute 07/04/2022 0.0  0.0 - 0.1 K/uL Final   Immature Granulocytes 07/04/2022 0  % Final   Abs Immature Granulocytes 07/04/2022 0.01  0.00 - 0.07 K/uL Final   Performed at [facility], [address] [place], [place] [address]   I-stat hCG, quantitative 07/04/2022 <5.0  <5 mIU/mL Final   Comment 3 07/04/2022          Final   Comment:   GEST. AGE      CONC.  (mIU/mL)   <=1 WEEK        5 - 50     2 WEEKS       50 - 500     3 WEEKS       100 - 10,000  4 WEEKS     1,000 - 30,000        FEMALE AND NON-PREGNANT FEMALE:     LESS THAN 5 mIU/mL   Admission on 06/14/2022, Discharged on 06/14/2022  Component Date Value Ref Range Status   Color, UA 06/14/2022 yellow  yellow Final   Clarity, UA 06/14/2022 cloudy (A)  clear Final   Glucose, UA 06/14/2022 negative  negative mg/dL Final   Bilirubin, UA 16/07/9603 negative  negative Final   Ketones, POC UA 06/14/2022 negative  negative mg/dL Final   Spec Grav, UA 54/06/8118 1.025  1.010 - 1.025 Final   Blood, UA 06/14/2022 negative  negative Final   pH, UA 06/14/2022 7.5  5.0 - 8.0 Final   Protein Ur, POC 06/14/2022 =30 (A)  negative mg/dL Final   Urobilinogen, UA 06/14/2022 0.2  0.2 or 1.0 E.U./dL Final   Nitrite, UA 14/78/2956 Negative  Negative Final   Leukocytes, UA 06/14/2022 Negative  Negative Final   Preg Test, Ur 06/14/2022 Negative  Negative Final   Specimen Description 06/14/2022 URINE, CLEAN CATCH   Final   Special Requests 06/14/2022    Final                   Value:NONE Performed at [facility], [address], [place], [place] [address]    Culture 06/14/2022 MULTIPLE SPECIES PRESENT, SUGGEST RECOLLECTION (A)   Final   Report Status 06/14/2022 06/16/2022 FINAL   Final  Admission on 06/07/2022, Discharged on 06/10/2022  Component Date Value Ref Range Status   SARS Coronavirus 2 by RT PCR 06/07/2022 NEGATIVE  NEGATIVE Final   Comment: (NOTE) SARS-CoV-2 target nucleic acids are NOT DETECTED.  The SARS-CoV-2 RNA is generally detectable in upper respiratory specimens during the acute phase of infection. The lowest concentration of SARS-CoV-2 viral copies this assay can detect is 138 copies/mL. A negative result does not preclude SARS-Cov-2 infection and should not be used as the sole basis for treatment or other patient management decisions. A negative result may occur with  improper specimen collection/handling, submission of specimen other than nasopharyngeal swab, presence of viral mutation(s) within the areas targeted by this assay, and inadequate number of viral copies(<138 copies/mL). A negative result must be combined with clinical observations, patient history, and epidemiological information. The expected result is Negative.  Fact Sheet for Patients:  BloggerCourse.com  Fact Sheet for Healthcare Providers:  SeriousBroker.it  This test is no                          t yet approved or cleared by the [place] FDA and  has been authorized for detection and/or diagnosis of SARS-CoV-2 by FDA under an Emergency Use Authorization (EUA). This EUA will remain  in effect (meaning this test can be used) for the duration of the COVID-19 declaration under Section 564(b)(1) of the Act, 21 U.S.C.section 360bbb-3(b)(1), unless the authorization is terminated  or revoked sooner.       Influenza A by PCR 06/07/2022 NEGATIVE  NEGATIVE Final   Influenza B by PCR 06/07/2022 NEGATIVE  NEGATIVE Final   Comment: (NOTE) The Xpert Xpress  SARS-CoV-2/FLU/RSV plus assay is intended as an aid in the diagnosis of influenza from Nasopharyngeal swab specimens and should not be used as a sole basis for treatment. Nasal washings and aspirates are unacceptable for Xpert Xpress SARS-CoV-2/FLU/RSV testing.  Fact Sheet for Patients: BloggerCourse.com  Fact Sheet for Healthcare Providers: SeriousBroker.it  This  test is not yet approved or cleared by the [place] and has been authorized for detection and/or diagnosis of SARS-CoV-2 by FDA under an Emergency Use Authorization (EUA). This EUA will remain in effect (meaning this test can be used) for the duration of the COVID-19 declaration under Section 564(b)(1) of the Act, 21 U.S.C. section 360bbb-3(b)(1), unless the authorization is terminated or revoked.  Performed at [facility], [address] [place], [place] [address]    WBC 06/07/2022 5.7  4.0 - 10.5 K/uL Final   RBC 06/07/2022 4.39  3.87 - 5.11 MIL/uL Final   Hemoglobin 06/07/2022 13.2  12.0 - 15.0 g/dL Final   HCT 19/14/7829 40.4  36.0 - 46.0 % Final   MCV 06/07/2022 92.0  80.0 - 100.0 fL Final   MCH 06/07/2022 30.1  26.0 - 34.0 pg Final   MCHC 06/07/2022 32.7  30.0 - 36.0 g/dL Final   RDW 56/21/3086 12.4  11.5 - 15.5 % Final   Platelets 06/07/2022 308  150 - 400 K/uL Final   nRBC 06/07/2022 0.0  0.0 - 0.2 % Final   Neutrophils Relative % 06/07/2022 42  % Final   Neutro Abs 06/07/2022 2.4  1.7 - 7.7 K/uL Final   Lymphocytes Relative 06/07/2022 54  % Final   Lymphs Abs 06/07/2022 3.1  0.7 - 4.0 K/uL Final   Monocytes Relative 06/07/2022 4  % Final   Monocytes Absolute 06/07/2022 0.2  0.1 - 1.0 K/uL Final   Eosinophils Relative 06/07/2022 0  % Final   Eosinophils Absolute 06/07/2022 0.0  0.0 - 0.5 K/uL Final   Basophils Relative 06/07/2022 0  % Final   Basophils Absolute 06/07/2022 0.0  0.0 - 0.1 K/uL Final   Immature Granulocytes 06/07/2022 0  %  Final   Abs Immature Granulocytes 06/07/2022 0.01  0.00 - 0.07 K/uL Final   Performed at [facility], [address] [place], [place] [address]   Sodium 06/07/2022 139  135 - 145 mmol/L Final   Potassium 06/07/2022 4.0  3.5 - 5.1 mmol/L Final   Chloride 06/07/2022 104  98 - 111 mmol/L Final   CO2 06/07/2022 28  22 - 32 mmol/L Final   Glucose, Bld 06/07/2022 104 (H)  70 - 99 mg/dL Final   Glucose reference range applies only to samples taken after fasting for at least 8 hours.   BUN 06/07/2022 8  6 - 20 mg/dL Final   Creatinine, Ser 06/07/2022 0.84  0.44 - 1.00 mg/dL Final   Calcium 96/29/5284 9.1  8.9 - 10.3 mg/dL Final   Total Protein 13/24/4010 6.9  6.5 - 8.1 g/dL Final   Albumin 27/25/3664 3.7  3.5 - 5.0 g/dL Final   AST 40/34/7425 19  15 - 41 U/L Final   ALT 06/07/2022 22  0 - 44 U/L Final   Alkaline Phosphatase 06/07/2022 54  38 - 126 U/L Final   Total Bilirubin 06/07/2022 0.4  0.3 - 1.2 mg/dL Final   GFR, Estimated 06/07/2022 >60  >60 mL/min Final   Comment: (NOTE) Calculated using the CKD-EPI Creatinine Equation (2021)    Anion gap 06/07/2022 7  5 - 15 Final   Performed at [facility], [address] [place], [place] [address]   Hgb A1c MFr Bld 06/07/2022 5.0  4.8 - 5.6 % Final   Comment: (NOTE) Pre diabetes:          5.7%-6.4%  Diabetes:              >  6.4%  Glycemic control for   <7.0% adults with diabetes    Mean Plasma Glucose 06/07/2022 96.8  mg/dL Final   Performed at [facility], [address] [place], [place] [address]   TSH 06/07/2022 1.620  0.350 - 4.500 uIU/mL Final   Comment: Performed by a 3rd Generation assay with a functional sensitivity of <=0.01 uIU/mL. Performed at [facility], [address] [place], [place] [address]    RPR Ser Ql 06/07/2022 NON REACTIVE  NON REACTIVE Final   Performed at [facility], [address] [place], [place] [address]   Color, Urine 06/07/2022 YELLOW  YELLOW Final   APPearance  06/07/2022 HAZY (A)  CLEAR Final   Specific Gravity, Urine 06/07/2022 1.018  1.005 - 1.030 Final   pH 06/07/2022 7.0  5.0 - 8.0 Final   Glucose, UA 06/07/2022 NEGATIVE  NEGATIVE mg/dL Final   Hgb urine dipstick 06/07/2022 NEGATIVE  NEGATIVE Final   Bilirubin Urine 06/07/2022 NEGATIVE  NEGATIVE Final   Ketones, ur 06/07/2022 NEGATIVE  NEGATIVE mg/dL Final   Protein, ur 59/74/1638 NEGATIVE  NEGATIVE mg/dL Final   Nitrite 45/36/4680 NEGATIVE  NEGATIVE Final   Leukocytes,Ua 06/07/2022 NEGATIVE  NEGATIVE Final   Performed at [facility], [address], [place], [place] [address]   Cholesterol 06/07/2022 164  0 - 200 mg/dL Final   Triglycerides 48/25/0037 158 (H)  <150 mg/dL Final   HDL 04/88/8916 44  >40 mg/dL Final   Total CHOL/HDL Ratio 06/07/2022 3.7  RATIO Final   VLDL 06/07/2022 32  0 - 40 mg/dL Final   LDL Cholesterol 06/07/2022 88  0 - 99 mg/dL Final   Comment:        Total Cholesterol/HDL:CHD Risk Coronary Heart Disease Risk Table                     Men   Women  1/2 Average Risk   3.4   3.3  Average Risk       5.0   4.4  2 X Average Risk   9.6   7.1  3 X Average Risk  23.4   11.0        Use the calculated Patient Ratio above and the CHD Risk Table to determine the patient's CHD Risk.        ATP III CLASSIFICATION (LDL):  <100     mg/dL   Optimal  945-038  mg/dL   Near or Above                    Optimal  130-159  mg/dL   Borderline  882-800  mg/dL   High  >349     mg/dL   Very High Performed at [facility], [address], [place], [place] [address]    HIV Screen 4th Generation wRfx 06/07/2022 Non Reactive  Non Reactive Final   Performed at [facility], [address], [place], [place] [address]   SARSCOV2ONAVIRUS 2 AG 06/07/2022 NEGATIVE  NEGATIVE Final   Comment: (NOTE) SARS-CoV-2 antigen NOT DETECTED.   Negative results are presumptive.  Negative results do not preclude SARS-CoV-2 infection and should not be used as the sole basis  for treatment or other patient management decisions, including infection  control decisions, particularly in the presence of clinical signs and  symptoms consistent with COVID-19, or in those who have been in contact with the virus.  Negative results must be  combined with clinical observations, patient history, and epidemiological information. The expected result is Negative.  Fact Sheet for Patients: https://www.jennings-kim.com/  Fact Sheet for Healthcare Providers: https://alexander-rogers.biz/  This test is not yet approved or cleared by the [place] FDA and  has been authorized for detection and/or diagnosis of SARS-CoV-2 by FDA under an Emergency Use Authorization (EUA).  This EUA will remain in effect (meaning this test can be used) for the duration of  the COV                          ID-19 declaration under Section 564(b)(1) of the Act, 21 U.S.C. section 360bbb-3(b)(1), unless the authorization is terminated or revoked sooner.     POC Amphetamine UR 06/07/2022 None Detected  NONE DETECTED (Cut Off Level 1000 ng/mL) Final   POC Secobarbital (BAR) 06/07/2022 None Detected  NONE DETECTED (Cut Off Level 300 ng/mL) Final   POC Buprenorphine (BUP) 06/07/2022 None Detected  NONE DETECTED (Cut Off Level 10 ng/mL) Final   POC Oxazepam (BZO) 06/07/2022 None Detected  NONE DETECTED (Cut Off Level 300 ng/mL) Final   POC Cocaine UR 06/07/2022 None Detected  NONE DETECTED (Cut Off Level 300 ng/mL) Final   POC Methamphetamine UR 06/07/2022 None Detected  NONE DETECTED (Cut Off Level 1000 ng/mL) Final   POC Morphine 06/07/2022 None Detected  NONE DETECTED (Cut Off Level 300 ng/mL) Final   POC Methadone UR 06/07/2022 None Detected  NONE DETECTED (Cut Off Level 300 ng/mL) Final   POC Oxycodone UR 06/07/2022 None Detected  NONE DETECTED (Cut Off Level 100 ng/mL) Final   POC Marijuana UR 06/07/2022 None Detected  NONE DETECTED (Cut Off Level 50 ng/mL) Final   Preg  Test, Ur 06/08/2022 NEGATIVE  NEGATIVE Final   Comment:        THE SENSITIVITY OF THIS METHODOLOGY IS >20 mIU/mL. Performed at [facility], [address] [place], [place] [address]    Preg Test, Ur 06/08/2022 NEGATIVE  NEGATIVE Final   Comment:        THE SENSITIVITY OF THIS METHODOLOGY IS >24 mIU/mL     Blood Alcohol level:  Lab Results  Component Value Date   ETH <10 07/04/2022   ETH <10 02/07/2022    Metabolic Disorder Labs: Lab Results  Component Value Date   HGBA1C 5.0 06/07/2022   MPG 96.8 06/07/2022   MPG 96.8 02/07/2022   No results found for: "PROLACTIN" Lab Results  Component Value Date   CHOL 181 07/25/2022   TRIG 118 07/25/2022   HDL 45 07/25/2022   CHOLHDL 4.0 07/25/2022   VLDL 24 07/25/2022   LDLCALC 112 (H) 07/25/2022   LDLCALC 88 06/07/2022    Therapeutic Lab Levels: No results found for: "LITHIUM" No results found for: "VALPROATE" No results found for: "CBMZ"  Physical Findings   GAD-7    Flowsheet Row Office Visit [facility]/17/2021 in [facility]  Total GAD-7 Score 17      PHQ2-9    Flowsheet Row ED from 06/07/2022 in [facility] Office Visit from 12/17/2021 in [facility]  PHQ-2 Total Score 1 2  PHQ-9 Total Score 2 8      Flowsheet Row ED from 07/25/2022 in [facility] ED from 07/04/2022 in [facility] from 06/14/2022 in [facility]   C-SSRS RISK CATEGORY High  Risk No Risk No Risk        Musculoskeletal  Strength & Muscle Tone: within normal limits Gait & Station: normal Patient leans: N/A  Psychiatric Specialty Exam  Presentation  General Appearance:  Appropriate for Environment; Fairly Groomed  Eye Contact: Good  Speech: Clear and Coherent; Normal Rate  Speech Volume: Normal  Handedness: Right   Mood and Affect   Mood: Euthymic  Affect: Appropriate; Congruent; Full Range   Thought Process  Thought Processes: Coherent; Goal Directed; Linear  Descriptions of Associations:Intact  Orientation:Full (Time, Place and Person)  Thought Content:Logical  Diagnosis of Schizophrenia or Schizoaffective disorder in past: Yes    Hallucinations:Hallucinations: None  Ideas of Reference:None  Suicidal Thoughts:Suicidal Thoughts: No  Homicidal Thoughts:Homicidal Thoughts: No   Sensorium  Memory: Immediate Good  Judgment: Fair  Insight: Fair   Executive Functions  Concentration: Good  Attention Span: Good  Recall: Good  Fund of Knowledge: Good  Language: Good   Psychomotor Activity  Psychomotor Activity: Psychomotor Activity: Normal   Assets  Assets: Communication Skills; Desire for Improvement; Leisure Time   Sleep  Sleep: Sleep: Good   No data recorded  Physical Exam  Physical Exam Constitutional:      General: She is not in acute distress.    Appearance: Normal appearance. She is not ill-appearing, toxic-appearing or diaphoretic.  Eyes:     General: No scleral icterus.       Right eye: No discharge.        Left eye: No discharge.  Pulmonary:     Effort: Pulmonary effort is normal. No respiratory distress.  Skin:    General: Skin is warm and dry.     Coloration: Skin is not jaundiced or pale.  Neurological:     Mental Status: She is alert and oriented to person, place, and time.  Psychiatric:        Attention and Perception: Attention and perception normal.        Mood and Affect: Mood and affect normal.        Speech: Speech normal.        Behavior: Behavior normal. Behavior is cooperative.        Thought Content: Thought content normal.    Review of Systems  Constitutional:  Negative for chills and fever.  Respiratory:  Negative for shortness of breath.   Cardiovascular:  Negative for chest pain and palpitations.  Gastrointestinal:  Negative  for abdominal pain.  Neurological:  Negative for headaches.  Psychiatric/Behavioral: Negative.     Blood pressure 116/85, pulse (!) 107, temperature 97.7 F (36.5 C), temperature source Oral, resp. rate 18, SpO2 96 %. There is no height or weight on file to calculate BMI.  Treatment Plan Summary: Plan Pt remains psychiatrically cleared. Dispo is pending.  Lauree Chandler, NP 08/11/2022 8:34 AM

## 2022-08-11 NOTE — ED Notes (Signed)
Pt was given chicken/gravy and juice for dinner.

## 2022-08-11 NOTE — ED Notes (Signed)
Pt taking a shower at present.

## 2022-08-11 NOTE — ED Notes (Signed)
[name]&Ox4. Patient denies SI/HI and AVH. Patient denies any physical complaints when asked. No acute distress noted. Support and encouragement provided. Routine safety checks conducted according to facility protocol. Encouraged patient to notify staff if thoughts of harm toward self or others arise. Patient verbalize understanding and agreement. Will continue to monitor for safety.    

## 2022-08-11 NOTE — ED Notes (Signed)
Patient resting quietly in bed with eyes closed. Respirations equal and unlabored, skin warm and dry, NAD. Routine safety checks conducted according to facility protocol. Will continue to monitor for safety.  

## 2022-08-11 NOTE — ED Notes (Signed)
Pt was given a sandwich and juice for lunch.

## 2022-08-11 NOTE — ED Notes (Signed)
Pt A&O x 4, calm & cooperative, watching TV at present, no distress noted.  Monitoring for safety. 

## 2022-08-11 NOTE — ED Notes (Signed)
Pt awake & resting at present, no distress noted, cam & cooperative.  Monitoring for safety.

## 2022-08-12 NOTE — ED Provider Notes (Signed)
Behavioral Health Progress Note  Date and Time: 08/12/2022 2:03 PM Name: Nicole [name] MRN:  [id number]  HPI: Nicole [name] is a 31 y.o. female, with PMH of SCZ unspecified, MDD with psychosis, who presented voluntary to [facility] (07/25/2022) via [facility] after a verbal altercation with [name] (not patient's legal guardian) for transient SI.  This is patient's fourth visit to [facility] for similar concerns this year. Patient has been dismissed from previous group home due to threatening SI and HI, then leaving the group home.   Legal Guardian: Mom (Maxine [place]) Point of contact: [name]) APS is following.   Subjective:   Patient was seen this AM, awake resting in bed comfortably.  Reported that the weekend was uneventful, yesterday was her birthday, so that made her feel a bit down.  Otherwise patient has no other questions or concerns.   SI: Suicidal Thoughts: No (Contracted to safety) SI Active Intent and/or Plan:  (Denied) HI: Homicidal Thoughts: No HI Active Intent and/or Plan:  (Denied) HI Passive Intent and/or Plan:  (Denied) AVH: Hallucinations: None Description of Auditory Hallucinations: Denied Description of Visual Hallucinations: Denied Ideas of [id number]   Mood: Euthymic Sleep:Good Appetite: Good  Review of Systems  Respiratory:  Negative for shortness of breath.   Cardiovascular:  Negative for chest pain.  Gastrointestinal:  Negative for abdominal pain, constipation, diarrhea, nausea and vomiting.  Musculoskeletal:  Negative for myalgias.  Neurological:  Positive for headaches. Negative for dizziness.     Diagnosis:  Final diagnoses:  Severe episode of recurrent major depressive disorder, with psychotic features ([place])    Total Time spent with patient: 15 minutes  Past Psychiatric History: SCZ unspecified, MDD with psychosis, Anxiety. Past Medical History:  Past Medical History:  Diagnosis Date   Anxiety    Depression    Hypothyroidism 08/07/2022    Tobacco use disorder 08/07/2022    Past Surgical History:  Procedure Laterality Date   WISDOM TOOTH EXTRACTION Bilateral 2020   Family History:  Family History  Problem Relation Age of Onset   Hypertension Father    Diabetes Father    Family Psychiatric  History: None reported Social History:  Social History   Substance and Sexual Activity  Alcohol Use Never     Social History   Substance and Sexual Activity  Drug Use Never    Social History   Socioeconomic History   Marital status: Single    Spouse name: Not on file   Number of children: Not on file   Years of education: Not on file   Highest education level: Not on file  Occupational History   Not on file  Tobacco Use   Smoking status: Every Day    Types: Cigarettes   Smokeless tobacco: Never  Vaping Use   Vaping Use: Some days  Substance and Sexual Activity   Alcohol use: Never   Drug use: Never   Sexual activity: Yes    Partners: Female    Birth control/protection: Implant  Other Topics Concern   Not on file  Social History Narrative   Not on file   Social Determinants of Health   Financial Resource Strain: Not on file  Food Insecurity: Not on file  Transportation Needs: Not on file  Physical Activity: Not on file  Stress: Not on file  Social Connections: Not on file   SDOH:  SDOH Screenings   Depression (PHQ2-9): Low Risk  (06/10/2022)  Tobacco Use: High Risk (08/07/2022)   Additional Social History:  Pain Medications: SEE MAR Prescriptions: SEE MAR Over the Counter: SEE MAR History of alcohol / drug use?: Yes Longest period of sobriety (when/how long): N/A Negative Consequences of Use:  (NONE) Withdrawal Symptoms: None Name of Substance 1: ETOH IN PAST--PT CURRENTLY DENIES 1 - Frequency: SOCIAL USE ETOH IN PAST 1 - Method of Aquiring: LEGAL 1- Route of Use: ORAL DRINK Name of Substance 2: [place] 2 - Amount (size/oz): LESS THAN ONE PACK 2 - Frequency:  DAILY 2 - Method of Aquiring: LEGAL 2 - Route of Substance Use: ORAL SMOKE               Sleep: Fair  Appetite:  Good  Current Medications:  Current Facility-Administered Medications  Medication Dose Route Frequency Provider Last Rate Last Admin   acetaminophen (TYLENOL) tablet 650 mg  650 mg Oral Q6H PRN Onuoha, Chinwendu V, NP   650 mg at 08/08/22 2137   alum & mag hydroxide-simeth (MAALOX/MYLANTA) 200-200-20 MG/5ML suspension 30 mL  30 mL Oral Q4H PRN Onuoha, Chinwendu V, NP   30 mL at 07/27/22 2151   ARIPiprazole ER (ABILIFY MAINTENA) injection 400 mg  400 mg Intramuscular Q28 days Merrily Brittle, DO   400 mg at 08/02/22 1154   clotrimazole (LOTRIMIN) 1 % cream   Topical BID Bobbitt, Shalon E, NP   Given at 08/12/22 0919   hydrOXYzine (ATARAX) tablet 25 mg  25 mg Oral TID PRN Onuoha, Chinwendu V, NP   25 mg at 08/10/22 0912   ibuprofen (ADVIL) tablet 400 mg  400 mg Oral Q6H PRN Merrily Brittle, DO   400 mg at 08/08/22 0854   levothyroxine (SYNTHROID) tablet 100 mcg  100 mcg Oral Q0600 Merrily Brittle, DO   100 mcg at 08/12/22 [id number]   magnesium hydroxide (MILK OF MAGNESIA) suspension 30 mL  30 mL Oral Daily PRN Onuoha, Chinwendu V, NP   30 mL at 08/02/22 1956   nicotine (NICODERM CQ - dosed in mg/24 hours) patch 14 mg  14 mg Transdermal Q0600 Onuoha, Chinwendu V, NP   14 mg at 08/12/22 0918   ondansetron (ZOFRAN) tablet 4 mg  4 mg Oral Once Nwoko, Uchenna E, PA       Oxcarbazepine (TRILEPTAL) tablet 300 mg  300 mg Oral BID Onuoha, Chinwendu V, NP   300 mg at 08/12/22 0918   pantoprazole (PROTONIX) EC tablet 20 mg  20 mg Oral Daily Merrily Brittle, DO   20 mg at 08/12/22 0918   QUEtiapine (SEROQUEL) tablet 400 mg  400 mg Oral BID Onuoha, Chinwendu V, NP   400 mg at 08/12/22 [id number]   senna-docusate (Senokot-S) tablet 1 tablet  1 tablet Oral QHS Merrily Brittle, DO   1 tablet at 08/11/22 2128   sertraline (ZOLOFT) tablet 150 mg  150 mg Oral Daily Onuoha, Chinwendu V, NP   150 mg at 08/12/22 0918    traZODone (DESYREL) tablet 50 mg  50 mg Oral QHS PRN Onuoha, Chinwendu V, NP   50 mg at 08/11/22 2128   valACYclovir (VALTREX) tablet 500 mg  500 mg Oral Daily Merrily Brittle, DO   500 mg at 08/12/22 [id number]   Current Outpatient Medications  Medication Sig Dispense Refill   ABILIFY MAINTENA 400 MG PRSY prefilled syringe 400 mg every 28 (twenty-eight) days.     cetirizine (ZYRTEC) 10 MG tablet Take 10 mg by mouth daily.     cyclobenzaprine (FLEXERIL) 10 MG tablet Take 1 tablet (10 mg total) by mouth 2 (two) times  daily as needed for muscle spasms. 20 tablet 0   fluticasone (FLONASE) 50 MCG/ACT nasal spray Place 1 spray into both nostrils daily.     hydrOXYzine (ATARAX) 50 MG tablet Take 1 tablet (50 mg total) by mouth 2 (two) times daily. 60 tablet 0   meloxicam (MOBIC) 15 MG tablet Take 15 mg by mouth daily.     Oxcarbazepine (TRILEPTAL) 300 MG tablet Take 1 tablet (300 mg total) by mouth 2 (two) times daily. 60 tablet 0   QUEtiapine (SEROQUEL) 400 MG tablet Take 1 tablet (400 mg total) by mouth 2 (two) times daily. 60 tablet 0   sertraline (ZOLOFT) 50 MG tablet Take 3 tablets (150 mg total) by mouth in the morning. 90 tablet 0   traZODone (DESYREL) 100 MG tablet Take 1 tablet (100 mg total) by mouth at bedtime. 30 tablet 0   valACYclovir (VALTREX) 500 MG tablet Take 500 mg by mouth daily.      Labs  Lab Results:  Admission on 07/25/2022  Component Date Value Ref Range Status   SARS Coronavirus 2 by RT PCR 07/25/2022 NEGATIVE  NEGATIVE Final   Comment: (NOTE) SARS-CoV-2 target nucleic acids are NOT DETECTED.  The SARS-CoV-2 RNA is generally detectable in upper respiratory specimens during the acute phase of infection. The lowest concentration of SARS-CoV-2 viral copies this assay can detect is 138 copies/mL. A negative result does not preclude SARS-Cov-2 infection and should not be used as the sole basis for treatment or other patient management decisions. A negative result may occur  with  improper specimen collection/handling, submission of specimen other than nasopharyngeal swab, presence of viral mutation(s) within the areas targeted by this assay, and inadequate number of viral copies(<138 copies/mL). A negative result must be combined with clinical observations, patient history, and epidemiological information. The expected result is Negative.  Fact Sheet for Patients:  EntrepreneurPulse.com.au  Fact Sheet for Healthcare Providers:  IncredibleEmployment.be  This test is no                          t yet approved or cleared by the [place] FDA and  has been authorized for detection and/or diagnosis of SARS-CoV-2 by FDA under an Emergency Use Authorization (EUA). This EUA will remain  in effect (meaning this test can be used) for the duration of the COVID-19 declaration under Section 564(b)(1) of the Act, 21 U.S.C.section 360bbb-3(b)(1), unless the authorization is terminated  or revoked sooner.       Influenza A by PCR 07/25/2022 NEGATIVE  NEGATIVE Final   Influenza B by PCR 07/25/2022 NEGATIVE  NEGATIVE Final   Comment: (NOTE) The Xpert Xpress SARS-CoV-2/FLU/RSV plus assay is intended as an aid in the diagnosis of influenza from Nasopharyngeal swab specimens and should not be used as a sole basis for treatment. Nasal washings and aspirates are unacceptable for Xpert Xpress SARS-CoV-2/FLU/RSV testing.  Fact Sheet for Patients: EntrepreneurPulse.com.au  Fact Sheet for Healthcare Providers: IncredibleEmployment.be  This test is not yet approved or cleared by the [place] FDA and has been authorized for detection and/or diagnosis of SARS-CoV-2 by FDA under an Emergency Use Authorization (EUA). This EUA will remain in effect (meaning this test can be used) for the duration of the COVID-19 declaration under Section 564(b)(1) of the Act, 21 U.S.C. section 360bbb-3(b)(1),  unless the authorization is terminated or revoked.  Performed at [facility], [place] [address], [place], [place] [address]    WBC 07/25/2022  8.3  4.0 - 10.5 K/uL Final   RBC 07/25/2022 4.44  3.87 - 5.11 MIL/uL Final   Hemoglobin 07/25/2022 13.7  12.0 - 15.0 g/dL Final   HCT 07/25/2022 40.2  36.0 - 46.0 % Final   MCV 07/25/2022 90.5  80.0 - 100.0 fL Final   MCH 07/25/2022 30.9  26.0 - 34.0 pg Final   MCHC 07/25/2022 34.1  30.0 - 36.0 g/dL Final   RDW 07/25/2022 12.2  11.5 - 15.5 % Final   Platelets 07/25/2022 248  150 - 400 K/uL Final   nRBC 07/25/2022 0.0  0.0 - 0.2 % Final   Neutrophils Relative % 07/25/2022 43  % Final   Neutro Abs 07/25/2022 3.6  1.7 - 7.7 K/uL Final   Lymphocytes Relative 07/25/2022 52  % Final   Lymphs Abs 07/25/2022 4.2 (H)  0.7 - 4.0 K/uL Final   Monocytes Relative 07/25/2022 5  % Final   Monocytes Absolute 07/25/2022 0.4  0.1 - 1.0 K/uL Final   Eosinophils Relative 07/25/2022 0  % Final   Eosinophils Absolute 07/25/2022 0.0  0.0 - 0.5 K/uL Final   Basophils Relative 07/25/2022 0  % Final   Basophils Absolute 07/25/2022 0.0  0.0 - 0.1 K/uL Final   Immature Granulocytes 07/25/2022 0  % Final   Abs Immature Granulocytes 07/25/2022 0.02  0.00 - 0.07 K/uL Final   Performed at [facility], [place] [address] [place], [place] [address]   Sodium 07/25/2022 138  135 - 145 mmol/L Final   Potassium 07/25/2022 4.0  3.5 - 5.1 mmol/L Final   Chloride 07/25/2022 104  98 - 111 mmol/L Final   CO2 07/25/2022 29  22 - 32 mmol/L Final   Glucose, Bld 07/25/2022 83  70 - 99 mg/dL Final   Glucose reference range applies only to samples taken after fasting for at least 8 hours.   BUN 07/25/2022 11  6 - 20 mg/dL Final   Creatinine, Ser 07/25/2022 0.97  0.44 - 1.00 mg/dL Final   Calcium 07/25/2022 9.2  8.9 - 10.3 mg/dL Final   Total Protein 07/25/2022 7.0  6.5 - 8.1 g/dL Final   Albumin 07/25/2022 3.8  3.5 - 5.0 g/dL Final   AST 07/25/2022 18  15 - 41 U/L Final   ALT  07/25/2022 22  0 - 44 U/L Final   Alkaline Phosphatase 07/25/2022 64  38 - 126 U/L Final   Total Bilirubin 07/25/2022 0.2 (L)  0.3 - 1.2 mg/dL Final   GFR, Estimated 07/25/2022 >60  >60 mL/min Final   Comment: (NOTE) Calculated using the CKD-EPI Creatinine Equation (2021)    Anion gap 07/25/2022 5  5 - 15 Final   Performed at [place] [address], [place], [place] [address]   POC Amphetamine UR 07/25/2022 None Detected  NONE DETECTED (Cut Off Level 1000 ng/mL) Preliminary   POC Secobarbital (BAR) 07/25/2022 None Detected  NONE DETECTED (Cut Off Level 300 ng/mL) Preliminary   POC Buprenorphine (BUP) 07/25/2022 None Detected  NONE DETECTED (Cut Off Level 10 ng/mL) Preliminary   POC Oxazepam (BZO) 07/25/2022 None Detected  NONE DETECTED (Cut Off Level 300 ng/mL) Preliminary   POC Cocaine UR 07/25/2022 None Detected  NONE DETECTED (Cut Off Level 300 ng/mL) Preliminary   POC Methamphetamine UR 07/25/2022 None Detected  NONE DETECTED (Cut Off Level 1000 ng/mL) Preliminary   POC Morphine 07/25/2022 None Detected  NONE DETECTED (Cut Off Level 300 ng/mL) Preliminary   POC Methadone UR 07/25/2022  None Detected  NONE DETECTED (Cut Off Level 300 ng/mL) Preliminary   POC Oxycodone UR 07/25/2022 None Detected  NONE DETECTED (Cut Off Level 100 ng/mL) Preliminary   POC Marijuana UR 07/25/2022 None Detected  NONE DETECTED (Cut Off Level 50 ng/mL) Preliminary   SARSCOV2ONAVIRUS 2 AG 07/25/2022 NEGATIVE  NEGATIVE Final   Comment: (NOTE) SARS-CoV-2 antigen NOT DETECTED.   Negative results are presumptive.  Negative results do not preclude SARS-CoV-2 infection and should not be used as the sole basis for treatment or other patient management decisions, including infection  control decisions, particularly in the presence of clinical signs and  symptoms consistent with COVID-19, or in those who have been in contact with the virus.  Negative results must be combined with clinical observations,  patient history, and epidemiological information. The expected result is Negative.  Fact Sheet for Patients: https://www.jennings-kim.com/  Fact Sheet for Healthcare Providers: https://alexander-rogers.biz/  This test is not yet approved or cleared by the [place] FDA and  has been authorized for detection and/or diagnosis of SARS-CoV-2 by FDA under an Emergency Use Authorization (EUA).  This EUA will remain in effect (meaning this test can be used) for the duration of  the COV                          ID-19 declaration under Section 564(b)(1) of the Act, 21 U.S.C. section 360bbb-3(b)(1), unless the authorization is terminated or revoked sooner.     Cholesterol 07/25/2022 181  0 - 200 mg/dL Final   Triglycerides 35/00/9381 118  <150 mg/dL Final   HDL 82/99/3716 45  >40 mg/dL Final   Total CHOL/HDL Ratio 07/25/2022 4.0  RATIO Final   VLDL 07/25/2022 24  0 - 40 mg/dL Final   LDL Cholesterol 07/25/2022 112 (H)  0 - 99 mg/dL Final   Comment:        Total Cholesterol/HDL:CHD Risk Coronary Heart Disease Risk Table                     Men   Women  1/2 Average Risk   3.4   3.3  Average Risk       5.0   4.4  2 X Average Risk   9.6   7.1  3 X Average Risk  23.4   11.0        Use the calculated Patient Ratio above and the CHD Risk Table to determine the patient's CHD Risk.        ATP III CLASSIFICATION (LDL):  <100     mg/dL   Optimal  967-893  mg/dL   Near or Above                    Optimal  130-159  mg/dL   Borderline  810-175  mg/dL   High  >102     mg/dL   Very High Performed at [facility], [address] [place], [place] [address]    TSH 07/25/2022 6.668 (H)  0.350 - 4.500 uIU/mL Final   Comment: Performed by a 3rd Generation assay with a functional sensitivity of <=0.01 uIU/mL. Performed at [facility], [address] [place], [place] [address]    Glucose-Capillary 07/26/2022 104 (H)  70 - 99 mg/dL Final   Glucose reference  range applies only to samples taken after fasting for at least 8 hours.   T3, Free 07/31/2022 2.3  2.0 - 4.4 pg/mL  Final   Comment: (NOTE) Performed At: [facility] [place], [place] [id number] Rush Farmer MD [id number]    Free T4 07/31/2022 0.60 (L)  0.61 - 1.12 ng/dL Final   Comment: (NOTE) Biotin ingestion may interfere with free T4 tests. If the results are inconsistent with the TSH level, previous test results, or the clinical presentation, then consider biotin interference. If needed, order repeat testing after stopping biotin. Performed at [facility], [place] [address], [place], [place] [address]   Admission on 07/04/2022, Discharged on 07/05/2022  Component Date Value Ref Range Status   Sodium 07/04/2022 142  135 - 145 mmol/L Final   Potassium 07/04/2022 4.4  3.5 - 5.1 mmol/L Final   Chloride 07/04/2022 109  98 - 111 mmol/L Final   CO2 07/04/2022 26  22 - 32 mmol/L Final   Glucose, Bld 07/04/2022 96  70 - 99 mg/dL Final   Glucose reference range applies only to samples taken after fasting for at least 8 hours.   BUN 07/04/2022 15  6 - 20 mg/dL Final   Creatinine, Ser 07/04/2022 0.83  0.44 - 1.00 mg/dL Final   Calcium 07/04/2022 9.3  8.9 - 10.3 mg/dL Final   Total Protein 07/04/2022 7.2  6.5 - 8.1 g/dL Final   Albumin 07/04/2022 3.9  3.5 - 5.0 g/dL Final   AST 07/04/2022 23  15 - 41 U/L Final   ALT 07/04/2022 28  0 - 44 U/L Final   Alkaline Phosphatase 07/04/2022 77  38 - 126 U/L Final   Total Bilirubin 07/04/2022 0.4  0.3 - 1.2 mg/dL Final   GFR, Estimated 07/04/2022 >60  >60 mL/min Final   Comment: (NOTE) Calculated using the CKD-EPI Creatinine Equation (2021)    Anion gap 07/04/2022 7  5 - 15 Final   Performed at [facility], [place] [address] [place], [place] [address]   Alcohol, Ethyl (B) 07/04/2022 <10  <10 mg/dL Final   Comment: (NOTE) Lowest detectable limit for serum alcohol is 10 mg/dL.  For medical  purposes only. Performed at [facility], [place] [address] [place], [place] [address]    WBC 07/04/2022 7.0  4.0 - 10.5 K/uL Final   RBC 07/04/2022 4.18  3.87 - 5.11 MIL/uL Final   Hemoglobin 07/04/2022 12.8  12.0 - 15.0 g/dL Final   HCT 07/04/2022 39.1  36.0 - 46.0 % Final   MCV 07/04/2022 93.5  80.0 - 100.0 fL Final   MCH 07/04/2022 30.6  26.0 - 34.0 pg Final   MCHC 07/04/2022 32.7  30.0 - 36.0 g/dL Final   RDW 07/04/2022 12.9  11.5 - 15.5 % Final   Platelets 07/04/2022 243  150 - 400 K/uL Final   nRBC 07/04/2022 0.0  0.0 - 0.2 % Final   Neutrophils Relative % 07/04/2022 43  % Final   Neutro Abs 07/04/2022 3.0  1.7 - 7.7 K/uL Final   Lymphocytes Relative 07/04/2022 50  % Final   Lymphs Abs 07/04/2022 3.5  0.7 - 4.0 K/uL Final   Monocytes Relative 07/04/2022 7  % Final   Monocytes Absolute 07/04/2022 0.5  0.1 - 1.0 K/uL Final   Eosinophils Relative 07/04/2022 0  % Final   Eosinophils Absolute 07/04/2022 0.0  0.0 - 0.5 K/uL Final   Basophils Relative 07/04/2022 0  % Final   Basophils Absolute 07/04/2022 0.0  0.0 - 0.1 K/uL Final   Immature Granulocytes 07/04/2022 0  % Final   Abs Immature Granulocytes  07/04/2022 0.01  0.00 - 0.07 K/uL Final   Performed at [facility] [address], [place], [place] [address]   I-stat hCG, quantitative 07/04/2022 <5.0  <5 mIU/mL Final   Comment 3 07/04/2022          Final   Comment:   GEST. AGE      CONC.  (mIU/mL)   <=1 WEEK        5 - 50     2 WEEKS       50 - 500     3 WEEKS       100 - 10,000     4 WEEKS     1,000 - 30,000        FEMALE AND NON-PREGNANT FEMALE:     LESS THAN 5 mIU/mL   Admission on 06/14/2022, Discharged on 06/14/2022  Component Date Value Ref Range Status   Color, UA 06/14/2022 yellow  yellow Final   Clarity, UA 06/14/2022 cloudy (A)  clear Final   Glucose, UA 06/14/2022 negative  negative mg/dL Final   Bilirubin, UA 06/14/2022 negative  negative Final   Ketones, POC UA 06/14/2022  negative  negative mg/dL Final   Spec Grav, UA 06/14/2022 1.025  1.010 - 1.025 Final   Blood, UA 06/14/2022 negative  negative Final   pH, UA 06/14/2022 7.5  5.0 - 8.0 Final   Protein Ur, POC 06/14/2022 =30 (A)  negative mg/dL Final   Urobilinogen, UA 06/14/2022 0.2  0.2 or 1.0 E.U./dL Final   Nitrite, UA 06/14/2022 Negative  Negative Final   Leukocytes, UA 06/14/2022 Negative  Negative Final   Preg Test, Ur 06/14/2022 Negative  Negative Final   Specimen Description 06/14/2022 URINE, CLEAN CATCH   Final   Special Requests 06/14/2022    Final                   Value:NONE Performed at [facility], [place] [address], [place], [place] [address]    Culture 06/14/2022 MULTIPLE SPECIES PRESENT, SUGGEST RECOLLECTION (A)   Final   Report Status 06/14/2022 06/16/2022 FINAL   Final  Admission on 06/07/2022, Discharged on 06/10/2022  Component Date Value Ref Range Status   SARS Coronavirus 2 by RT PCR 06/07/2022 NEGATIVE  NEGATIVE Final   Comment: (NOTE) SARS-CoV-2 target nucleic acids are NOT DETECTED.  The SARS-CoV-2 RNA is generally detectable in upper respiratory specimens during the acute phase of infection. The lowest concentration of SARS-CoV-2 viral copies this assay can detect is 138 copies/mL. A negative result does not preclude SARS-Cov-2 infection and should not be used as the sole basis for treatment or other patient management decisions. A negative result may occur with  improper specimen collection/handling, submission of specimen other than nasopharyngeal swab, presence of viral mutation(s) within the areas targeted by this assay, and inadequate number of viral copies(<138 copies/mL). A negative result must be combined with clinical observations, patient history, and epidemiological information. The expected result is Negative.  Fact Sheet for Patients:  EntrepreneurPulse.com.au  Fact Sheet for Healthcare Providers:   IncredibleEmployment.be  This test is no                          t yet approved or cleared by the [place] FDA and  has been authorized for detection and/or diagnosis of SARS-CoV-2 by FDA under an Emergency Use Authorization (EUA). This EUA will remain  in effect (meaning this test can be used)  for the duration of the COVID-19 declaration under Section 564(b)(1) of the Act, 21 U.S.C.section 360bbb-3(b)(1), unless the authorization is terminated  or revoked sooner.       Influenza A by PCR 06/07/2022 NEGATIVE  NEGATIVE Final   Influenza B by PCR 06/07/2022 NEGATIVE  NEGATIVE Final   Comment: (NOTE) The Xpert Xpress SARS-CoV-2/FLU/RSV plus assay is intended as an aid in the diagnosis of influenza from Nasopharyngeal swab specimens and should not be used as a sole basis for treatment. Nasal washings and aspirates are unacceptable for Xpert Xpress SARS-CoV-2/FLU/RSV testing.  Fact Sheet for Patients: EntrepreneurPulse.com.au  Fact Sheet for Healthcare Providers: IncredibleEmployment.be  This test is not yet approved or cleared by the [place] FDA and has been authorized for detection and/or diagnosis of SARS-CoV-2 by FDA under an Emergency Use Authorization (EUA). This EUA will remain in effect (meaning this test can be used) for the duration of the COVID-19 declaration under Section 564(b)(1) of the Act, 21 U.S.C. section 360bbb-3(b)(1), unless the authorization is terminated or revoked.  Performed at [facility], [place] [address], [place], [place] [address]    WBC 06/07/2022 5.7  4.0 - 10.5 K/uL Final   RBC 06/07/2022 4.39  3.87 - 5.11 MIL/uL Final   Hemoglobin 06/07/2022 13.2  12.0 - 15.0 g/dL Final   HCT 06/07/2022 40.4  36.0 - 46.0 % Final   MCV 06/07/2022 92.0  80.0 - 100.0 fL Final   MCH 06/07/2022 30.1  26.0 - 34.0 pg Final   MCHC 06/07/2022 32.7  30.0 - 36.0 g/dL Final   RDW 06/07/2022 12.4  11.5  - 15.5 % Final   Platelets 06/07/2022 308  150 - 400 K/uL Final   nRBC 06/07/2022 0.0  0.0 - 0.2 % Final   Neutrophils Relative % 06/07/2022 42  % Final   Neutro Abs 06/07/2022 2.4  1.7 - 7.7 K/uL Final   Lymphocytes Relative 06/07/2022 54  % Final   Lymphs Abs 06/07/2022 3.1  0.7 - 4.0 K/uL Final   Monocytes Relative 06/07/2022 4  % Final   Monocytes Absolute 06/07/2022 0.2  0.1 - 1.0 K/uL Final   Eosinophils Relative 06/07/2022 0  % Final   Eosinophils Absolute 06/07/2022 0.0  0.0 - 0.5 K/uL Final   Basophils Relative 06/07/2022 0  % Final   Basophils Absolute 06/07/2022 0.0  0.0 - 0.1 K/uL Final   Immature Granulocytes 06/07/2022 0  % Final   Abs Immature Granulocytes 06/07/2022 0.01  0.00 - 0.07 K/uL Final   Performed at [facility], [place] [address], [place], [place] [address]   Sodium 06/07/2022 139  135 - 145 mmol/L Final   Potassium 06/07/2022 4.0  3.5 - 5.1 mmol/L Final   Chloride 06/07/2022 104  98 - 111 mmol/L Final   CO2 06/07/2022 28  22 - 32 mmol/L Final   Glucose, Bld 06/07/2022 104 (H)  70 - 99 mg/dL Final   Glucose reference range applies only to samples taken after fasting for at least 8 hours.   BUN 06/07/2022 8  6 - 20 mg/dL Final   Creatinine, Ser 06/07/2022 0.84  0.44 - 1.00 mg/dL Final   Calcium 06/07/2022 9.1  8.9 - 10.3 mg/dL Final   Total Protein 06/07/2022 6.9  6.5 - 8.1 g/dL Final   Albumin 06/07/2022 3.7  3.5 - 5.0 g/dL Final   AST 06/07/2022 19  15 - 41 U/L Final   ALT 06/07/2022 22  0 - 44 U/L Final  Alkaline Phosphatase 06/07/2022 54  38 - 126 U/L Final   Total Bilirubin 06/07/2022 0.4  0.3 - 1.2 mg/dL Final   GFR, Estimated 06/07/2022 >60  >60 mL/min Final   Comment: (NOTE) Calculated using the CKD-EPI Creatinine Equation (2021)    Anion gap 06/07/2022 7  5 - 15 Final   Performed at [facility] [address] [place], [place] [address]   Hgb A1c MFr Bld 06/07/2022 5.0  4.8 - 5.6 % Final   Comment: (NOTE) Pre diabetes:           5.7%-6.4%  Diabetes:              >6.4%  Glycemic control for   <7.0% adults with diabetes    Mean Plasma Glucose 06/07/2022 96.8  mg/dL Final   Performed at [facility], [place] [address] [place], [place] [address]   TSH 06/07/2022 1.620  0.350 - 4.500 uIU/mL Final   Comment: Performed by a 3rd Generation assay with a functional sensitivity of <=0.01 uIU/mL. Performed at [facility], [place] [address] [place], [place] [address]    RPR Ser Ql 06/07/2022 NON REACTIVE  NON REACTIVE Final   Performed at [facility], [place] [address], [place], [place] [address]   Color, Urine 06/07/2022 YELLOW  YELLOW Final   APPearance 06/07/2022 HAZY (A)  CLEAR Final   Specific Gravity, Urine 06/07/2022 1.018  1.005 - 1.030 Final   pH 06/07/2022 7.0  5.0 - 8.0 Final   Glucose, UA 06/07/2022 NEGATIVE  NEGATIVE mg/dL Final   Hgb urine dipstick 06/07/2022 NEGATIVE  NEGATIVE Final   Bilirubin Urine 06/07/2022 NEGATIVE  NEGATIVE Final   Ketones, ur 06/07/2022 NEGATIVE  NEGATIVE mg/dL Final   Protein, ur 06/07/2022 NEGATIVE  NEGATIVE mg/dL Final   Nitrite 06/07/2022 NEGATIVE  NEGATIVE Final   Leukocytes,Ua 06/07/2022 NEGATIVE  NEGATIVE Final   Performed at [facility], [place] [address] [place], [place] [address]   Cholesterol 06/07/2022 164  0 - 200 mg/dL Final   Triglycerides 06/07/2022 158 (H)  <150 mg/dL Final   HDL 06/07/2022 44  >40 mg/dL Final   Total CHOL/HDL Ratio 06/07/2022 3.7  RATIO Final   VLDL 06/07/2022 32  0 - 40 mg/dL Final   LDL Cholesterol 06/07/2022 88  0 - 99 mg/dL Final   Comment:        Total Cholesterol/HDL:CHD Risk Coronary Heart Disease Risk Table                     Men   Women  1/2 Average Risk   3.4   3.3  Average Risk       5.0   4.4  2 X Average Risk   9.6   7.1  3 X Average Risk  23.4   11.0        Use the calculated Patient Ratio above and the CHD Risk Table to determine the patient's CHD Risk.        ATP III CLASSIFICATION (LDL):  <100      mg/dL   Optimal  100-129  mg/dL   Near or Above                    Optimal  130-159  mg/dL   Borderline  160-189  mg/dL   High  >190     mg/dL   Very High Performed at [facility] [address],  [place], [place] [address]    HIV Screen 4th Generation wRfx 06/07/2022 Non Reactive  Non Reactive Final   Performed at [facility], [place] [address], [place], [place] [address]   SARSCOV2ONAVIRUS 2 AG 06/07/2022 NEGATIVE  NEGATIVE Final   Comment: (NOTE) SARS-CoV-2 antigen NOT DETECTED.   Negative results are presumptive.  Negative results do not preclude SARS-CoV-2 infection and should not be used as the sole basis for treatment or other patient management decisions, including infection  control decisions, particularly in the presence of clinical signs and  symptoms consistent with COVID-19, or in those who have been in contact with the virus.  Negative results must be combined with clinical observations, patient history, and epidemiological information. The expected result is Negative.  Fact Sheet for Patients: HandmadeRecipes.com.cy  Fact Sheet for Healthcare Providers: FuneralLife.at  This test is not yet approved or cleared by the [place] FDA and  has been authorized for detection and/or diagnosis of SARS-CoV-2 by FDA under an Emergency Use Authorization (EUA).  This EUA will remain in effect (meaning this test can be used) for the duration of  the COV                          ID-19 declaration under Section 564(b)(1) of the Act, 21 U.S.C. section 360bbb-3(b)(1), unless the authorization is terminated or revoked sooner.     POC Amphetamine UR 06/07/2022 None Detected  NONE DETECTED (Cut Off Level 1000 ng/mL) Final   POC Secobarbital (BAR) 06/07/2022 None Detected  NONE DETECTED (Cut Off Level 300 ng/mL) Final   POC Buprenorphine (BUP) 06/07/2022 None Detected  NONE DETECTED (Cut Off Level 10 ng/mL) Final   POC  Oxazepam (BZO) 06/07/2022 None Detected  NONE DETECTED (Cut Off Level 300 ng/mL) Final   POC Cocaine UR 06/07/2022 None Detected  NONE DETECTED (Cut Off Level 300 ng/mL) Final   POC Methamphetamine UR 06/07/2022 None Detected  NONE DETECTED (Cut Off Level 1000 ng/mL) Final   POC Morphine 06/07/2022 None Detected  NONE DETECTED (Cut Off Level 300 ng/mL) Final   POC Methadone UR 06/07/2022 None Detected  NONE DETECTED (Cut Off Level 300 ng/mL) Final   POC Oxycodone UR 06/07/2022 None Detected  NONE DETECTED (Cut Off Level 100 ng/mL) Final   POC Marijuana UR 06/07/2022 None Detected  NONE DETECTED (Cut Off Level 50 ng/mL) Final   Preg Test, Ur 06/08/2022 NEGATIVE  NEGATIVE Final   Comment:        THE SENSITIVITY OF THIS METHODOLOGY IS >20 mIU/mL. Performed at [facility], [place] [address], [place], [place] [address]    Preg Test, Ur 06/08/2022 NEGATIVE  NEGATIVE Final   Comment:        THE SENSITIVITY OF THIS METHODOLOGY IS >24 mIU/mL     Blood Alcohol level:  Lab Results  Component Value Date   ETH <10 07/04/2022   ETH <10 84/16/6063    Metabolic Disorder Labs: Lab Results  Component Value Date   HGBA1C 5.0 06/07/2022   MPG 96.8 06/07/2022   MPG 96.8 02/07/2022   No results found for: "PROLACTIN" Lab Results  Component Value Date   CHOL 181 07/25/2022   TRIG 118 07/25/2022   HDL 45 07/25/2022   CHOLHDL 4.0 07/25/2022   VLDL 24 07/25/2022   LDLCALC 112 (H) 07/25/2022   LDLCALC 88 06/07/2022    Therapeutic Lab Levels: No results found for: "LITHIUM" No results found for: "VALPROATE" No results  found for: "CBMZ"  Physical Findings   GAD-7    Flowsheet Row Office Visit from 12/17/2021 in [facility]  Total GAD-7 Score 17      PHQ2-9    [facility] ED from 06/07/2022 in [facility] Office Visit from 12/17/2021 in [facility]  PHQ-2 Total Score 1 2  PHQ-9 Total Score 2 8       Flowsheet Row ED from 07/25/2022 in [facility] ED from 07/04/2022 in [facility] ED from 06/14/2022 in [facility] High Risk No Risk No Risk        Musculoskeletal  Strength & Muscle Tone: within normal limits Gait & Station: normal Patient leans: N/A  Psychiatric Specialty Exam  Presentation  General Appearance:  Appropriate for Environment; Fairly Groomed  Eye Contact: Good  Speech: Clear and Coherent; Normal Rate  Speech Volume: Normal  Handedness: Right   Mood and Affect  Mood: Euthymic  Affect: Appropriate; Congruent; Full Range   Thought Process  Thought Processes: Coherent; Goal Directed; Linear  Descriptions of Associations:Intact  Orientation:Full (Time, Place and Person)  Thought Content:Logical  Diagnosis of Schizophrenia or Schizoaffective disorder in past: Yes    Hallucinations:Hallucinations: None  Ideas of Reference:None  Suicidal Thoughts:Suicidal Thoughts: No  Homicidal Thoughts:Homicidal Thoughts: No   Sensorium  Memory: Immediate Good  Judgment: Fair  Insight: Fair   Executive Functions  Concentration: Good  Attention Span: Good  Recall: Good  Fund of Knowledge: Good  Language: Good   Psychomotor Activity  Psychomotor Activity:Psychomotor Activity: Normal   Assets  Assets: Communication Skills; Desire for Improvement; Leisure Time   Sleep  Sleep:Sleep: Good  Physical Exam  Blood pressure 109/63, pulse (!) 105, temperature 98.1 F (36.7 C), temperature source Oral, resp. rate 20, SpO2 98 %. There is no height or weight on file to calculate BMI.  Physical Exam Vitals and nursing note reviewed.  Constitutional:      General: She is awake. She is not in acute distress.    Appearance: She is not ill-appearing or diaphoretic.  HENT:     Head: Normocephalic.  Pulmonary:     Effort:  Pulmonary effort is normal. No respiratory distress.  Neurological:     Mental Status: She is alert.      Treatment Plan Summary: TOC and DSS team continues to seek placement for patient, appreciate assistance.   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, recurrent herpes infection, GERD, hypothyroidism   Current sch rx:  ARIPiprazole ER, 400 mg, Q28 days clotrimazole, , BID levothyroxine, 100 mcg, Q0600 nicotine, 14 mg, Q0600 ondansetron, 4 mg, Once Oxcarbazepine, 300 mg, BID pantoprazole, 20 mg, Daily QUEtiapine, 400 mg, BID senna-docusate, 1 tablet, QHS sertraline, 150 mg, Daily valACYclovir, 500 mg, Daily   Dispo Tentative date: TBA Location: TBA Barrier: Housing, psychological testing Received proof of guardianship    Patient continues to be psychiatric stable and ready for dispo pending placement availability   Signed: Merrily Brittle, DO Psychiatry Resident, PGY-2 [facility] 08/12/2022, 2:03 PM

## 2022-08-12 NOTE — ED Notes (Addendum)
Pt is in the bed resting. Respirations are even and unlabored. No acute distress noted. Will continue to monitor for safety 

## 2022-08-12 NOTE — Plan of Care (Signed)
Extended Emergency Contact Information Primary Emergency Contact: [place] [employer] Phone: [phone] Relation: Father  Unable to reach collateral per above x2 for verbal consent for IQ and Adaptive Behavior Test. Unable to leave a HIPAA compliant voicemail, as mailbox has not been set up. Will try again.   Merrily Brittle, DO, PGY-2

## 2022-08-12 NOTE — ED Notes (Signed)
Pt is sleeping. No distress noted. Will continue to monitor safety. 

## 2022-08-12 NOTE — ED Notes (Signed)
Pt resting quietly.  Denies pain, SI, HI, and AVH.  Breathing is even and unlabored, will continue to monitor for safety.

## 2022-08-12 NOTE — ED Notes (Signed)
Pt provided word searches and coloring pages.

## 2022-08-12 NOTE — ED Notes (Signed)
Pt sleeping at present, no distress noted.  Monitoring for safety. 

## 2022-08-13 NOTE — Plan of Care (Addendum)
Unable to get in contact with dad, Marcy Salvo @[phone], for consent for IQ test and adaptive behavior test. Unable to leave a voicemail and the mailbox has not been set up. X1. Will try again.  Unable to get in contact with patient's previous group home owner, [name] @ [phone], to see if she has record of patient's psychological testing. Left a HIPAA compliant voicemail x1. Will try again.  Merrily Brittle, DO, PGY2

## 2022-08-13 NOTE — Care Management (Signed)
Care Management   Writer contacted the [facility] Kingsley Callander: [phone] by phone and email ([email or web address]) in order to find out if the patient is needing a psychological evaluation or a psychiatric evaluation.

## 2022-08-13 NOTE — Care Management (Signed)
Writer contacted [name]) the previous group home owner regarding the patient's psychological evaluation.  Per the owner, the patient does not have a current psychological evaluation.  The owner reports that only the FL-2 is current.

## 2022-08-13 NOTE — ED Provider Notes (Signed)
Behavioral Health Progress Note  Date and Time: 08/13/2022 11:21 AM Name: Nicole [name] Quam MRN:  [id number]  HPI: Nicole [name] is a 31 y.o. female, with PMH of SCZ unspecified, MDD with psychosis, who [facility] via [facility] after a verbal altercation with Jeannett SeniorJosephine Okeke (not patient's legal guardian) for transient SI.  This is [facility] similar concerns this year. Patient has been dismissed from previous group home due to threatening SI and HI, then leaving the group home.   Legal Guardian: [name](Maxine Brown) transitioning to be [name](Albert Brown) Point of contact: [name](Albert Brown) APS is following.   Subjective:   Patient was seen this morning, resting in bed comfortably. She denies any acute events overnight. Patient states that it is "hard to breathe" when she lays down, none when she sits up. She is continuing to have headaches and "feeling off balanced" when she sits up suddenly that self resolves. Denied feeling unbalanced or dizzy afterwards and when she walks. Stated that she is not drinking enough water. Last bowel movement was last night. No other concerns at this time.  Encouraged patient to increase PO fluid intake and to stand up and turn slowly.   SI:  Suicidal Thoughts: No (Contracted to safety) SI Active Intent and/or Plan:  (Denied) HI:  Homicidal Thoughts: No HI Active Intent and/or Plan:  (Denied) HI Passive Intent and/or Plan:  (Denied) AVH:  Hallucinations: None Description of Auditory Hallucinations: Denied Description of Visual Hallucinations: Denied  Mood: "Good"  Sleep:Good  Appetite: Good  Review of Systems  Eyes:  Negative for blurred vision and double vision.  Respiratory:  Negative for shortness of breath.        Positive for difficulty breathing when laying down   Cardiovascular:  Negative for chest pain.  Gastrointestinal:  Negative for abdominal pain, constipation, diarrhea, nausea and vomiting.   Musculoskeletal:  Negative for myalgias.  Neurological:  Positive for headaches.       Positive for feeling off balanced      Diagnosis:  Final diagnoses:  Severe episode of recurrent major depressive disorder, with psychotic features (HCC)    Total Time spent with patient: 15 minutes  Past Psychiatric History: SCZ unspecified, MDD with psychosis, Anxiety. Past Medical History:  Past Medical History:  Diagnosis Date   Anxiety    Depression    Hypothyroidism 08/07/2022   Tobacco use disorder 08/07/2022    Past Surgical History:  Procedure Laterality Date   WISDOM TOOTH EXTRACTION Bilateral 2020   Family History:  Family History  Problem Relation Age of Onset   Hypertension Father    Diabetes Father    Family Psychiatric  History: None reported Social History:  Social History   Substance and Sexual Activity  Alcohol Use Never     Social History   Substance and Sexual Activity  Drug Use Never    Social History   Socioeconomic History   Marital status: Single    Spouse name: Not on file   Number of children: Not on file   Years of education: Not on file   Highest education level: Not on file  Occupational History   Not on file  Tobacco Use   Smoking status: Every Day    Types: Cigarettes   Smokeless tobacco: Never  Vaping Use   Vaping Use: Some days  Substance and Sexual Activity   Alcohol use: Never   Drug use: Never   Sexual activity: Yes  Partners: Female    Birth control/protection: Implant  Other Topics Concern   Not on file  Social History Narrative   Not on file   Social Determinants of Health   Financial Resource Strain: Not on file  Food Insecurity: Not on file  Transportation Needs: Not on file  Physical Activity: Not on file  Stress: Not on file  Social Connections: Not on file   SDOH:  SDOH Screenings   Depression (PHQ2-9): Low Risk  (06/10/2022)  Tobacco Use: High Risk (08/07/2022)   Additional Social History:    Pain  Medications: SEE MAR Prescriptions: SEE MAR Over the Counter: SEE MAR History of alcohol / drug use?: Yes Longest period of sobriety (when/how long): N/A Negative Consequences of Use:  (NONE) Withdrawal Symptoms: None Name of Substance 1: ETOH IN PAST--PT CURRENTLY DENIES 1 - Frequency: SOCIAL USE ETOH IN PAST 1 - Method of Aquiring: LEGAL 1- Route of Use: ORAL DRINK Name of Substance 2: NICOTINE-VAPE AND CIGARETTES 2 - Amount (size/oz): LESS THAN ONE PACK 2 - Frequency: DAILY 2 - Method of Aquiring: LEGAL 2 - Route of Substance Use: ORAL SMOKE               Sleep: Good  Appetite:  Good  Current Medications:  Current Facility-Administered Medications  Medication Dose Route Frequency Provider Last Rate Last Admin   acetaminophen (TYLENOL) tablet 650 mg  650 mg Oral Q6H PRN Onuoha, Chinwendu V, NP   650 mg at 08/12/22 1846   alum & mag hydroxide-simeth (MAALOX/MYLANTA) 200-200-20 MG/5ML suspension 30 mL  30 mL Oral Q4H PRN Onuoha, Chinwendu V, NP   30 mL at 07/27/22 2151   ARIPiprazole ER (ABILIFY MAINTENA) injection 400 mg  400 mg Intramuscular Q28 days Princess Bruins, DO   400 mg at 08/02/22 1154   clotrimazole (LOTRIMIN) 1 % cream   Topical BID Bobbitt, Shalon E, NP   Given at 08/12/22 2111   hydrOXYzine (ATARAX) tablet 25 mg  25 mg Oral TID PRN Onuoha, Chinwendu V, NP   25 mg at 08/10/22 0912   ibuprofen (ADVIL) tablet 400 mg  400 mg Oral Q6H PRN Princess Bruins, DO   400 mg at 08/08/22 0854   levothyroxine (SYNTHROID) tablet 100 mcg  100 mcg Oral Q0600 Princess Bruins, DO   100 mcg at 08/13/22 0602   magnesium hydroxide (MILK OF MAGNESIA) suspension 30 mL  30 mL Oral Daily PRN Onuoha, Chinwendu V, NP   30 mL at 08/02/22 1956   nicotine (NICODERM CQ - dosed in mg/24 hours) patch 14 mg  14 mg Transdermal Q0600 Onuoha, Chinwendu V, NP   14 mg at 08/13/22 0932   ondansetron (ZOFRAN) tablet 4 mg  4 mg Oral Once Nwoko, Uchenna E, PA       Oxcarbazepine (TRILEPTAL) tablet 300 mg  300  mg Oral BID Onuoha, Chinwendu V, NP   300 mg at 08/13/22 0933   pantoprazole (PROTONIX) EC tablet 20 mg  20 mg Oral Daily Princess Bruins, DO   20 mg at 08/13/22 0933   QUEtiapine (SEROQUEL) tablet 400 mg  400 mg Oral BID Onuoha, Chinwendu V, NP   400 mg at 08/13/22 0933   senna-docusate (Senokot-S) tablet 1 tablet  1 tablet Oral QHS Princess Bruins, DO   1 tablet at 08/12/22 2111   sertraline (ZOLOFT) tablet 150 mg  150 mg Oral Daily Onuoha, Chinwendu V, NP   150 mg at 08/13/22 0933   traZODone (DESYREL) tablet 50 mg  50  mg Oral QHS PRN Onuoha, Chinwendu V, NP   50 mg at 08/12/22 2111   valACYclovir (VALTREX) tablet 500 mg  500 mg Oral Daily Princess Bruins, DO   500 mg at 08/13/22 4401   Current Outpatient Medications  Medication Sig Dispense Refill   ABILIFY MAINTENA 400 MG PRSY prefilled syringe 400 mg every 28 (twenty-eight) days.     cetirizine (ZYRTEC) 10 MG tablet Take 10 mg by mouth daily.     cyclobenzaprine (FLEXERIL) 10 MG tablet Take 1 tablet (10 mg total) by mouth 2 (two) times daily as needed for muscle spasms. 20 tablet 0   fluticasone (FLONASE) 50 MCG/ACT nasal spray Place 1 spray into both nostrils daily.     hydrOXYzine (ATARAX) 50 MG tablet Take 1 tablet (50 mg total) by mouth 2 (two) times daily. 60 tablet 0   meloxicam (MOBIC) 15 MG tablet Take 15 mg by mouth daily.     Oxcarbazepine (TRILEPTAL) 300 MG tablet Take 1 tablet (300 mg total) by mouth 2 (two) times daily. 60 tablet 0   QUEtiapine (SEROQUEL) 400 MG tablet Take 1 tablet (400 mg total) by mouth 2 (two) times daily. 60 tablet 0   sertraline (ZOLOFT) 50 MG tablet Take 3 tablets (150 mg total) by mouth in the morning. 90 tablet 0   traZODone (DESYREL) 100 MG tablet Take 1 tablet (100 mg total) by mouth at bedtime. 30 tablet 0   valACYclovir (VALTREX) 500 MG tablet Take 500 mg by mouth daily.      Labs  Lab Results:  Admission on 07/25/2022  Component Date Value Ref Range Status   SARS Coronavirus 2 by RT PCR  07/25/2022 NEGATIVE  NEGATIVE Final   Comment: (NOTE) SARS-CoV-2 target nucleic acids are NOT DETECTED.  The SARS-CoV-2 RNA is generally detectable in upper respiratory specimens during the acute phase of infection. The lowest concentration of SARS-CoV-2 viral copies this assay can detect is 138 copies/mL. A negative result does not preclude SARS-Cov-2 infection and should not be used as the sole basis for treatment or other patient management decisions. A negative result may occur with  improper specimen collection/handling, submission of specimen other than nasopharyngeal swab, presence of viral mutation(s) within the areas targeted by this assay, and inadequate number of viral copies(<138 copies/mL). A negative result must be combined with clinical observations, patient history, and epidemiological information. The expected result is Negative.  Fact Sheet for Patients:  BloggerCourse.com  Fact Sheet for Healthcare Providers:  SeriousBroker.it  This test is no                          t yet approved or cleared by the [place] FDA and  has been authorized for detection and/or diagnosis of SARS-CoV-2 by FDA under an Emergency Use Authorization (EUA). This EUA will remain  in effect (meaning this test can be used) for the duration of the COVID-19 declaration under Section 564(b)(1) of the Act, 21 U.S.C.section 360bbb-3(b)(1), unless the authorization is terminated  or revoked sooner.       Influenza A by PCR 07/25/2022 NEGATIVE  NEGATIVE Final   Influenza B by PCR 07/25/2022 NEGATIVE  NEGATIVE Final   Comment: (NOTE) The Xpert Xpress SARS-CoV-2/FLU/RSV plus assay is intended as an aid in the diagnosis of influenza from Nasopharyngeal swab specimens and should not be used as a sole basis for treatment. Nasal washings and aspirates are unacceptable for Xpert Xpress SARS-CoV-2/FLU/RSV testing.  Fact Sheet for  Patients: BloggerCourse.com  Fact Sheet for Healthcare Providers: SeriousBroker.it  This test is not yet approved or cleared by the [place] FDA and has been authorized for detection and/or diagnosis of SARS-CoV-2 by FDA under an Emergency Use Authorization (EUA). This EUA will remain in effect (meaning this test can be used) for the duration of the COVID-19 declaration under Section 564(b)(1) of the Act, 21 U.S.C. section 360bbb-3(b)(1), unless the authorization is terminated or revoked.  Performed at [facility], [address] [place], [place] [address]    WBC 07/25/2022 8.3  4.0 - 10.5 K/uL Final   RBC 07/25/2022 4.44  3.87 - 5.11 MIL/uL Final   Hemoglobin 07/25/2022 13.7  12.0 - 15.0 g/dL Final   HCT 18/56/3149 40.2  36.0 - 46.0 % Final   MCV 07/25/2022 90.5  80.0 - 100.0 fL Final   MCH 07/25/2022 30.9  26.0 - 34.0 pg Final   MCHC 07/25/2022 34.1  30.0 - 36.0 g/dL Final   RDW 70/26/3785 12.2  11.5 - 15.5 % Final   Platelets 07/25/2022 248  150 - 400 K/uL Final   nRBC 07/25/2022 0.0  0.0 - 0.2 % Final   Neutrophils Relative % 07/25/2022 43  % Final   Neutro Abs 07/25/2022 3.6  1.7 - 7.7 K/uL Final   Lymphocytes Relative 07/25/2022 52  % Final   Lymphs Abs 07/25/2022 4.2 (H)  0.7 - 4.0 K/uL Final   Monocytes Relative 07/25/2022 5  % Final   Monocytes Absolute 07/25/2022 0.4  0.1 - 1.0 K/uL Final   Eosinophils Relative 07/25/2022 0  % Final   Eosinophils Absolute 07/25/2022 0.0  0.0 - 0.5 K/uL Final   Basophils Relative 07/25/2022 0  % Final   Basophils Absolute 07/25/2022 0.0  0.0 - 0.1 K/uL Final   Immature Granulocytes 07/25/2022 0  % Final   Abs Immature Granulocytes 07/25/2022 0.02  0.00 - 0.07 K/uL Final   Performed at [facility], [address] [place], [place] [address]   Sodium 07/25/2022 138  135 - 145 mmol/L Final   Potassium 07/25/2022 4.0  3.5 - 5.1 mmol/L Final   Chloride 07/25/2022 104   98 - 111 mmol/L Final   CO2 07/25/2022 29  22 - 32 mmol/L Final   Glucose, Bld 07/25/2022 83  70 - 99 mg/dL Final   Glucose reference range applies only to samples taken after fasting for at least 8 hours.   BUN 07/25/2022 11  6 - 20 mg/dL Final   Creatinine, Ser 07/25/2022 0.97  0.44 - 1.00 mg/dL Final   Calcium 77/41/2878 9.2  8.9 - 10.3 mg/dL Final   Total Protein 67/67/2094 7.0  6.5 - 8.1 g/dL Final   Albumin 70/96/2836 3.8  3.5 - 5.0 g/dL Final   AST 62/94/7654 18  15 - 41 U/L Final   ALT 07/25/2022 22  0 - 44 U/L Final   Alkaline Phosphatase 07/25/2022 64  38 - 126 U/L Final   Total Bilirubin 07/25/2022 0.2 (L)  0.3 - 1.2 mg/dL Final   GFR, Estimated 07/25/2022 >60  >60 mL/min Final   Comment: (NOTE) Calculated using the CKD-EPI Creatinine Equation (2021)    Anion gap 07/25/2022 5  5 - 15 Final   Performed at [facility], [address] [place], [place] [address]   POC Amphetamine UR 07/25/2022 None Detected  NONE DETECTED (Cut Off Level 1000 ng/mL) Preliminary   POC Secobarbital (BAR) 07/25/2022 None Detected  NONE DETECTED (  Cut Off Level 300 ng/mL) Preliminary   POC Buprenorphine (BUP) 07/25/2022 None Detected  NONE DETECTED (Cut Off Level 10 ng/mL) Preliminary   POC Oxazepam (BZO) 07/25/2022 None Detected  NONE DETECTED (Cut Off Level 300 ng/mL) Preliminary   POC Cocaine UR 07/25/2022 None Detected  NONE DETECTED (Cut Off Level 300 ng/mL) Preliminary   POC Methamphetamine UR 07/25/2022 None Detected  NONE DETECTED (Cut Off Level 1000 ng/mL) Preliminary   POC Morphine 07/25/2022 None Detected  NONE DETECTED (Cut Off Level 300 ng/mL) Preliminary   POC Methadone UR 07/25/2022 None Detected  NONE DETECTED (Cut Off Level 300 ng/mL) Preliminary   POC Oxycodone UR 07/25/2022 None Detected  NONE DETECTED (Cut Off Level 100 ng/mL) Preliminary   POC Marijuana UR 07/25/2022 None Detected  NONE DETECTED (Cut Off Level 50 ng/mL) Preliminary   SARSCOV2ONAVIRUS 2 AG 07/25/2022 NEGATIVE   NEGATIVE Final   Comment: (NOTE) SARS-CoV-2 antigen NOT DETECTED.   Negative results are presumptive.  Negative results do not preclude SARS-CoV-2 infection and should not be used as the sole basis for treatment or other patient management decisions, including infection  control decisions, particularly in the presence of clinical signs and  symptoms consistent with COVID-19, or in those who have been in contact with the virus.  Negative results must be combined with clinical observations, patient history, and epidemiological information. The expected result is Negative.  Fact Sheet for Patients: https://www.jennings-kim.com/  Fact Sheet for Healthcare Providers: https://alexander-rogers.biz/  This test is not yet approved or cleared by the [place] FDA and  has been authorized for detection and/or diagnosis of SARS-CoV-2 by FDA under an Emergency Use Authorization (EUA).  This EUA will remain in effect (meaning this test can be used) for the duration of  the COV                          ID-19 declaration under Section 564(b)(1) of the Act, 21 U.S.C. section 360bbb-3(b)(1), unless the authorization is terminated or revoked sooner.     Cholesterol 07/25/2022 181  0 - 200 mg/dL Final   Triglycerides 94/76/5465 118  <150 mg/dL Final   HDL 03/54/6568 45  >40 mg/dL Final   Total CHOL/HDL Ratio 07/25/2022 4.0  RATIO Final   VLDL 07/25/2022 24  0 - 40 mg/dL Final   LDL Cholesterol 07/25/2022 112 (H)  0 - 99 mg/dL Final   Comment:        Total Cholesterol/HDL:CHD Risk Coronary Heart Disease Risk Table                     Men   Women  1/2 Average Risk   3.4   3.3  Average Risk       5.0   4.4  2 X Average Risk   9.6   7.1  3 X Average Risk  23.4   11.0        Use the calculated Patient Ratio above and the CHD Risk Table to determine the patient's CHD Risk.        ATP III CLASSIFICATION (LDL):  <100     mg/dL   Optimal  127-517  mg/dL   Near or  Above                    Optimal  130-159  mg/dL   Borderline  001-749  mg/dL   High  >449     mg/dL   Very High  Performed at [facility], [address], [place], [place] [address]    TSH 07/25/2022 6.668 (H)  0.350 - 4.500 uIU/mL Final   Comment: Performed by a 3rd Generation assay with a functional sensitivity of <=0.01 uIU/mL. Performed at [facility], [address] [place], [place] [address]    Glucose-Capillary 07/26/2022 104 (H)  70 - 99 mg/dL Final   Glucose reference range applies only to samples taken after fasting for at least 8 hours.   T3, Free 07/31/2022 2.3  2.0 - 4.4 pg/mL Final   Comment: (NOTE) Performed At: [facility] [address] [place], [place] [id number] Jolene Schimke MD ZH:[id number]    Free T4 07/31/2022 0.60 (L)  0.61 - 1.12 ng/dL Final   Comment: (NOTE) Biotin ingestion may interfere with free T4 tests. If the results are inconsistent with the TSH level, previous test results, or the clinical presentation, then consider biotin interference. If needed, order repeat testing after stopping biotin. Performed at [facility], [address] [place], [place] [address]   Admission on 07/04/2022, Discharged on 07/05/2022  Component Date Value Ref Range Status   Sodium 07/04/2022 142  135 - 145 mmol/L Final   Potassium 07/04/2022 4.4  3.5 - 5.1 mmol/L Final   Chloride 07/04/2022 109  98 - 111 mmol/L Final   CO2 07/04/2022 26  22 - 32 mmol/L Final   Glucose, Bld 07/04/2022 96  70 - 99 mg/dL Final   Glucose reference range applies only to samples taken after fasting for at least 8 hours.   BUN 07/04/2022 15  6 - 20 mg/dL Final   Creatinine, Ser 07/04/2022 0.83  0.44 - 1.00 mg/dL Final   Calcium 41/32/4401 9.3  8.9 - 10.3 mg/dL Final   Total Protein 02/72/5366 7.2  6.5 - 8.1 g/dL Final   Albumin 44/12/4740 3.9  3.5 - 5.0 g/dL Final   AST 59/56/3875 23  15 - 41 U/L Final   ALT 07/04/2022 28  0 - 44 U/L Final   Alkaline  Phosphatase 07/04/2022 77  38 - 126 U/L Final   Total Bilirubin 07/04/2022 0.4  0.3 - 1.2 mg/dL Final   GFR, Estimated 07/04/2022 >60  >60 mL/min Final   Comment: (NOTE) Calculated using the CKD-EPI Creatinine Equation (2021)    Anion gap 07/04/2022 7  5 - 15 Final   Performed at [facility], [address], [place], [place] [address]   Alcohol, Ethyl (B) 07/04/2022 <10  <10 mg/dL Final   Comment: (NOTE) Lowest detectable limit for serum alcohol is 10 mg/dL.  For medical purposes only. Performed at [facility], [address] [place], [place] [address]    WBC 07/04/2022 7.0  4.0 - 10.5 K/uL Final   RBC 07/04/2022 4.18  3.87 - 5.11 MIL/uL Final   Hemoglobin 07/04/2022 12.8  12.0 - 15.0 g/dL Final   HCT 41/66/0630 39.1  36.0 - 46.0 % Final   MCV 07/04/2022 93.5  80.0 - 100.0 fL Final   MCH 07/04/2022 30.6  26.0 - 34.0 pg Final   MCHC 07/04/2022 32.7  30.0 - 36.0 g/dL Final   RDW 16/10/930 12.9  11.5 - 15.5 % Final   Platelets 07/04/2022 243  150 - 400 K/uL Final   nRBC 07/04/2022 0.0  0.0 - 0.2 % Final   Neutrophils Relative % 07/04/2022 43  % Final   Neutro Abs 07/04/2022 3.0  1.7 - 7.7 K/uL Final  Lymphocytes Relative 07/04/2022 50  % Final   Lymphs Abs 07/04/2022 3.5  0.7 - 4.0 K/uL Final   Monocytes Relative 07/04/2022 7  % Final   Monocytes Absolute 07/04/2022 0.5  0.1 - 1.0 K/uL Final   Eosinophils Relative 07/04/2022 0  % Final   Eosinophils Absolute 07/04/2022 0.0  0.0 - 0.5 K/uL Final   Basophils Relative 07/04/2022 0  % Final   Basophils Absolute 07/04/2022 0.0  0.0 - 0.1 K/uL Final   Immature Granulocytes 07/04/2022 0  % Final   Abs Immature Granulocytes 07/04/2022 0.01  0.00 - 0.07 K/uL Final   Performed at [facility], [address] [place], [place] [address]   I-stat hCG, quantitative 07/04/2022 <5.0  <5 mIU/mL Final   Comment 3 07/04/2022          Final   Comment:   GEST. AGE      CONC.  (mIU/mL)    <=1 WEEK        5 - 50     2 WEEKS       50 - 500     3 WEEKS       100 - 10,000     4 WEEKS     1,000 - 30,000        FEMALE AND NON-PREGNANT FEMALE:     LESS THAN 5 mIU/mL   Admission on 06/14/2022, Discharged on 06/14/2022  Component Date Value Ref Range Status   Color, UA 06/14/2022 yellow  yellow Final   Clarity, UA 06/14/2022 cloudy (A)  clear Final   Glucose, UA 06/14/2022 negative  negative mg/dL Final   Bilirubin, UA 81/19/1478 negative  negative Final   Ketones, POC UA 06/14/2022 negative  negative mg/dL Final   Spec Grav, UA 29/56/2130 1.025  1.010 - 1.025 Final   Blood, UA 06/14/2022 negative  negative Final   pH, UA 06/14/2022 7.5  5.0 - 8.0 Final   Protein Ur, POC 06/14/2022 =30 (A)  negative mg/dL Final   Urobilinogen, UA 06/14/2022 0.2  0.2 or 1.0 E.U./dL Final   Nitrite, UA 86/57/8469 Negative  Negative Final   Leukocytes, UA 06/14/2022 Negative  Negative Final   Preg Test, Ur 06/14/2022 Negative  Negative Final   Specimen Description 06/14/2022 URINE, CLEAN CATCH   Final   Special Requests 06/14/2022    Final                   Value:NONE Performed at [facility], [address] [place], [place] [address]    Culture 06/14/2022 MULTIPLE SPECIES PRESENT, SUGGEST RECOLLECTION (A)   Final   Report Status 06/14/2022 06/16/2022 FINAL   Final  Admission on 06/07/2022, Discharged on 06/10/2022  Component Date Value Ref Range Status   SARS Coronavirus 2 by RT PCR 06/07/2022 NEGATIVE  NEGATIVE Final   Comment: (NOTE) SARS-CoV-2 target nucleic acids are NOT DETECTED.  The SARS-CoV-2 RNA is generally detectable in upper respiratory specimens during the acute phase of infection. The lowest concentration of SARS-CoV-2 viral copies this assay can detect is 138 copies/mL. A negative result does not preclude SARS-Cov-2 infection and should not be used as the sole basis for treatment or other patient management decisions. A negative result may occur with  improper  specimen collection/handling, submission of specimen other than nasopharyngeal swab, presence of viral mutation(s) within the areas targeted by this assay, and inadequate number of viral copies(<138 copies/mL). A negative result must be combined with clinical observations, patient  history, and epidemiological information. The expected result is Negative.  Fact Sheet for Patients:  BloggerCourse.com  Fact Sheet for Healthcare Providers:  SeriousBroker.it  This test is no                          t yet approved or cleared by the [place] FDA and  has been authorized for detection and/or diagnosis of SARS-CoV-2 by FDA under an Emergency Use Authorization (EUA). This EUA will remain  in effect (meaning this test can be used) for the duration of the COVID-19 declaration under Section 564(b)(1) of the Act, 21 U.S.C.section 360bbb-3(b)(1), unless the authorization is terminated  or revoked sooner.       Influenza A by PCR 06/07/2022 NEGATIVE  NEGATIVE Final   Influenza B by PCR 06/07/2022 NEGATIVE  NEGATIVE Final   Comment: (NOTE) The Xpert Xpress SARS-CoV-2/FLU/RSV plus assay is intended as an aid in the diagnosis of influenza from Nasopharyngeal swab specimens and should not be used as a sole basis for treatment. Nasal washings and aspirates are unacceptable for Xpert Xpress SARS-CoV-2/FLU/RSV testing.  Fact Sheet for Patients: BloggerCourse.com  Fact Sheet for Healthcare Providers: SeriousBroker.it  This test is not yet approved or cleared by the [place] FDA and has been authorized for detection and/or diagnosis of SARS-CoV-2 by FDA under an Emergency Use Authorization (EUA). This EUA will remain in effect (meaning this test can be used) for the duration of the COVID-19 declaration under Section 564(b)(1) of the Act, 21 U.S.C. section 360bbb-3(b)(1), unless the  authorization is terminated or revoked.  Performed at [facility], [address], [place], [place] [address]    WBC 06/07/2022 5.7  4.0 - 10.5 K/uL Final   RBC 06/07/2022 4.39  3.87 - 5.11 MIL/uL Final   Hemoglobin 06/07/2022 13.2  12.0 - 15.0 g/dL Final   HCT 60/45/4098 40.4  36.0 - 46.0 % Final   MCV 06/07/2022 92.0  80.0 - 100.0 fL Final   MCH 06/07/2022 30.1  26.0 - 34.0 pg Final   MCHC 06/07/2022 32.7  30.0 - 36.0 g/dL Final   RDW 11/91/4782 12.4  11.5 - 15.5 % Final   Platelets 06/07/2022 308  150 - 400 K/uL Final   nRBC 06/07/2022 0.0  0.0 - 0.2 % Final   Neutrophils Relative % 06/07/2022 42  % Final   Neutro Abs 06/07/2022 2.4  1.7 - 7.7 K/uL Final   Lymphocytes Relative 06/07/2022 54  % Final   Lymphs Abs 06/07/2022 3.1  0.7 - 4.0 K/uL Final   Monocytes Relative 06/07/2022 4  % Final   Monocytes Absolute 06/07/2022 0.2  0.1 - 1.0 K/uL Final   Eosinophils Relative 06/07/2022 0  % Final   Eosinophils Absolute 06/07/2022 0.0  0.0 - 0.5 K/uL Final   Basophils Relative 06/07/2022 0  % Final   Basophils Absolute 06/07/2022 0.0  0.0 - 0.1 K/uL Final   Immature Granulocytes 06/07/2022 0  % Final   Abs Immature Granulocytes 06/07/2022 0.01  0.00 - 0.07 K/uL Final   Performed at [facility], [address] [place], [place] [address]   Sodium 06/07/2022 139  135 - 145 mmol/L Final   Potassium 06/07/2022 4.0  3.5 - 5.1 mmol/L Final   Chloride 06/07/2022 104  98 - 111 mmol/L Final   CO2 06/07/2022 28  22 - 32 mmol/L Final   Glucose, Bld 06/07/2022 104 (H)  70 - 99 mg/dL Final  Glucose reference range applies only to samples taken after fasting for at least 8 hours.   BUN 06/07/2022 8  6 - 20 mg/dL Final   Creatinine, Ser 06/07/2022 0.84  0.44 - 1.00 mg/dL Final   Calcium 16/07/9603 9.1  8.9 - 10.3 mg/dL Final   Total Protein 54/06/8118 6.9  6.5 - 8.1 g/dL Final   Albumin 14/78/2956 3.7  3.5 - 5.0 g/dL Final   AST 21/30/8657 19  15 - 41 U/L Final   ALT 06/07/2022  22  0 - 44 U/L Final   Alkaline Phosphatase 06/07/2022 54  38 - 126 U/L Final   Total Bilirubin 06/07/2022 0.4  0.3 - 1.2 mg/dL Final   GFR, Estimated 06/07/2022 >60  >60 mL/min Final   Comment: (NOTE) Calculated using the CKD-EPI Creatinine Equation (2021)    Anion gap 06/07/2022 7  5 - 15 Final   Performed at [facility], [address] [place], [place] [address]   Hgb A1c MFr Bld 06/07/2022 5.0  4.8 - 5.6 % Final   Comment: (NOTE) Pre diabetes:          5.7%-6.4%  Diabetes:              >6.4%  Glycemic control for   <7.0% adults with diabetes    Mean Plasma Glucose 06/07/2022 96.8  mg/dL Final   Performed at [facility], [address] [place], [place] [address]   TSH 06/07/2022 1.620  0.350 - 4.500 uIU/mL Final   Comment: Performed by a 3rd Generation assay with a functional sensitivity of <=0.01 uIU/mL. Performed at [facility], [address] [place], [place] [address]    RPR Ser Ql 06/07/2022 NON REACTIVE  NON REACTIVE Final   Performed at [facility], [address] [place], [place] [address]   Color, Urine 06/07/2022 YELLOW  YELLOW Final   APPearance 06/07/2022 HAZY (A)  CLEAR Final   Specific Gravity, Urine 06/07/2022 1.018  1.005 - 1.030 Final   pH 06/07/2022 7.0  5.0 - 8.0 Final   Glucose, UA 06/07/2022 NEGATIVE  NEGATIVE mg/dL Final   Hgb urine dipstick 06/07/2022 NEGATIVE  NEGATIVE Final   Bilirubin Urine 06/07/2022 NEGATIVE  NEGATIVE Final   Ketones, ur 06/07/2022 NEGATIVE  NEGATIVE mg/dL Final   Protein, ur 72/53/6644 NEGATIVE  NEGATIVE mg/dL Final   Nitrite 03/47/4259 NEGATIVE  NEGATIVE Final   Leukocytes,Ua 06/07/2022 NEGATIVE  NEGATIVE Final   Performed at [facility], [address], [place], [place] [address]   Cholesterol 06/07/2022 164  0 - 200 mg/dL Final   Triglycerides 56/43/3295 158 (H)  <150 mg/dL Final   HDL 18/84/1660 44  >40 mg/dL Final   Total CHOL/HDL Ratio 06/07/2022 3.7  RATIO Final   VLDL 06/07/2022  32  0 - 40 mg/dL Final   LDL Cholesterol 06/07/2022 88  0 - 99 mg/dL Final   Comment:        Total Cholesterol/HDL:CHD Risk Coronary Heart Disease Risk Table                     Men   Women  1/2 Average Risk   3.4   3.3  Average Risk       5.0   4.4  2 X Average Risk   9.6   7.1  3 X Average Risk  23.4   11.0        Use the calculated Patient Ratio above  and the CHD Risk Table to determine the patient's CHD Risk.        ATP III CLASSIFICATION (LDL):  <100     mg/dL   Optimal  981-191  mg/dL   Near or Above                    Optimal  130-159  mg/dL   Borderline  478-295  mg/dL   High  >621     mg/dL   Very High Performed at [facility], [address] [place], [place] [address]    HIV Screen 4th Generation wRfx 06/07/2022 Non Reactive  Non Reactive Final   Performed at [facility], [address], [place], [place] [address]   SARSCOV2ONAVIRUS 2 AG 06/07/2022 NEGATIVE  NEGATIVE Final   Comment: (NOTE) SARS-CoV-2 antigen NOT DETECTED.   Negative results are presumptive.  Negative results do not preclude SARS-CoV-2 infection and should not be used as the sole basis for treatment or other patient management decisions, including infection  control decisions, particularly in the presence of clinical signs and  symptoms consistent with COVID-19, or in those who have been in contact with the virus.  Negative results must be combined with clinical observations, patient history, and epidemiological information. The expected result is Negative.  Fact Sheet for Patients: https://www.jennings-kim.com/  Fact Sheet for Healthcare Providers: https://alexander-rogers.biz/  This test is not yet approved or cleared by the [place] FDA and  has been authorized for detection and/or diagnosis of SARS-CoV-2 by FDA under an Emergency Use Authorization (EUA).  This EUA will remain in effect (meaning this test can be used) for the duration of  the COV                           ID-19 declaration under Section 564(b)(1) of the Act, 21 U.S.C. section 360bbb-3(b)(1), unless the authorization is terminated or revoked sooner.     POC Amphetamine UR 06/07/2022 None Detected  NONE DETECTED (Cut Off Level 1000 ng/mL) Final   POC Secobarbital (BAR) 06/07/2022 None Detected  NONE DETECTED (Cut Off Level 300 ng/mL) Final   POC Buprenorphine (BUP) 06/07/2022 None Detected  NONE DETECTED (Cut Off Level 10 ng/mL) Final   POC Oxazepam (BZO) 06/07/2022 None Detected  NONE DETECTED (Cut Off Level 300 ng/mL) Final   POC Cocaine UR 06/07/2022 None Detected  NONE DETECTED (Cut Off Level 300 ng/mL) Final   POC Methamphetamine UR 06/07/2022 None Detected  NONE DETECTED (Cut Off Level 1000 ng/mL) Final   POC Morphine 06/07/2022 None Detected  NONE DETECTED (Cut Off Level 300 ng/mL) Final   POC Methadone UR 06/07/2022 None Detected  NONE DETECTED (Cut Off Level 300 ng/mL) Final   POC Oxycodone UR 06/07/2022 None Detected  NONE DETECTED (Cut Off Level 100 ng/mL) Final   POC Marijuana UR 06/07/2022 None Detected  NONE DETECTED (Cut Off Level 50 ng/mL) Final   Preg Test, Ur 06/08/2022 NEGATIVE  NEGATIVE Final   Comment:        THE SENSITIVITY OF THIS METHODOLOGY IS >20 mIU/mL. Performed at [facility], [address], [place], [place] [address]    Preg Test, Ur 06/08/2022 NEGATIVE  NEGATIVE Final   Comment:        THE SENSITIVITY OF THIS METHODOLOGY IS >24 mIU/mL     Blood Alcohol level:  Lab Results  Component Value Date   ETH <10 07/04/2022   ETH <  10 14/43/1540    Metabolic Disorder Labs: Lab Results  Component Value Date   HGBA1C 5.0 06/07/2022   MPG 96.8 06/07/2022   MPG 96.8 02/07/2022   No results found for: "PROLACTIN" Lab Results  Component Value Date   CHOL 181 07/25/2022   TRIG 118 07/25/2022   HDL 45 07/25/2022   CHOLHDL 4.0 07/25/2022   VLDL 24 07/25/2022   LDLCALC 112 (H) 07/25/2022   LDLCALC 88 06/07/2022     Therapeutic Lab Levels: No results found for: "LITHIUM" No results found for: "VALPROATE" No results found for: "CBMZ"  Physical Findings   GAD-7    Flowsheet Row Office Visit from 12/17/2021 in [place]  Total GAD-7 Score 17      PHQ2-9    [place] ED from 06/07/2022 in [facility] Office Visit from 12/17/2021 in [place]  PHQ-2 Total Score 1 2  PHQ-9 Total Score 2 8      Flowsheet [place] [facility] from 07/25/2022 in [facility] ED from 07/04/2022 in [place] [facility]PT ED from 06/14/2022 in [facility] High Risk No Risk No Risk        Musculoskeletal  Strength & Muscle Tone: within normal limits Gait & Station: normal Patient leans: N/A  Psychiatric Specialty Exam  Presentation  General Appearance:  Appropriate for Environment; Fairly Groomed  Eye Contact: Good  Speech: Clear and Coherent; Normal Rate  Speech Volume: Normal  Handedness: Right   Mood and Affect  Mood: Euthymic  Affect: Appropriate; Congruent; Full Range   Thought Process  Thought Processes: Coherent; Goal Directed; Linear  Descriptions of Associations:Intact  Orientation:Full (Time, Place and Person)  Thought Content:Logical  Diagnosis of Schizophrenia or Schizoaffective disorder in past: Yes    Hallucinations:No data recorded  Ideas of Reference:None  Suicidal Thoughts:No data recorded  Homicidal Thoughts:No data recorded   Sensorium  Memory: Immediate Good  Judgment: Fair  Insight: Fair   Community education officer  Concentration: Good  Attention Span: Good  Recall: Good  Fund of Knowledge: Good  Language: Good   Psychomotor Activity  Psychomotor Activity:No data recorded   Assets  Assets: Communication Skills; Desire for Improvement; Leisure  Time   Sleep  Sleep:No data recorded  Physical Exam  Blood pressure 102/77, pulse 100, temperature 98.5 F (36.9 C), temperature source Oral, resp. rate 16, SpO2 99 %. There is no height or weight on file to calculate BMI.  Physical Exam Vitals and nursing note reviewed.  Constitutional:      General: She is awake. She is not in acute distress.    Appearance: She is not ill-appearing or diaphoretic.  HENT:     Head: Normocephalic.  Pulmonary:     Effort: Pulmonary effort is normal. No respiratory distress.  Neurological:     Mental Status: She is alert.      Treatment Plan Summary: - TOC and DSS team continues to seek placement for patient, appreciate assistance.  - Unable to reach collateral for verbal consent for IQ and Adaptive Behavior Test.Will attempt to reach out again tomorrow.  - Unable to reach previous group home owner that patient was residing in. Will attempt to reach out again tomorrow.    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, recurrent herpes infection, GERD, hypothyroidism  Current sch rx:  ARIPiprazole ER, 400 mg, Q28 days clotrimazole, ,  BID levothyroxine, 100 mcg, Q0600 nicotine, 14 mg, Q0600 ondansetron, 4 mg, Once Oxcarbazepine, 300 mg, BID pantoprazole, 20 mg, Daily QUEtiapine, 400 mg, BID senna-docusate, 1 tablet, QHS sertraline, 150 mg, Daily valACYclovir, 500 mg, Daily   For Follow-up after dc: Suspect OSA, recommend PCP appointment for sleep study referral  Dispo Tentative date: TBA Location: TBA Barrier: Housing, psychological testing - need consent  Received proof of guardianship    Patient continues to be psychiatric stable and ready for dispo pending placement availability.  Signed: Nigel Mormon, Student-PA ______________________________________________________ I was present for the entirety of the evaluation on 08/13/2022. I reviewed the patient's chart, and I participated in key portions of the service. I discussed  the case with the PA student, and I agree with the assessment and plan of care as documented in the PA student's note.   Princess Bruins, DO  Psychiatry Resident, PGY-2 [facility] 08/13/2022, 11:21 AM

## 2022-08-13 NOTE — ED Notes (Signed)
Pt is in the bed watching TV. Respirations are even and unlabored. No acute distress noted. Will continue to monitor for safety.  

## 2022-08-13 NOTE — ED Notes (Signed)
Pt is sleeping. No distress noted. Will continue to monitor safety. 

## 2022-08-14 NOTE — ED Notes (Signed)
Pt sleeping at present, no distress noted.  Monitoring for safety. 

## 2022-08-14 NOTE — Care Management (Signed)
Care Management   Writer was successful in reaching the mother and father on the phone.  The patient mother and father are married and still live together.    The patient's mother is the legal guardian and the father is in the process of having the legal guardianship switched into his name.    Consent was given verbally on the phone and was witnessed by Dr. Dwyane Dee for the patient to receive a psychological assessment at the [facility].

## 2022-08-14 NOTE — ED Notes (Signed)
Pt given a snack.  Is awake and sitting up in the bed. No distress noted.

## 2022-08-14 NOTE — Care Management (Signed)
Care Management   Per [place] [facility] Callander), a referral for placement has been submitted to [facility] and the application can be processed once the assessment is completed and submitted to [facility].

## 2022-08-14 NOTE — ED Notes (Signed)
Pt A&O x 4, no distress noted, calm & cooperative.  Monitoring for safety. 

## 2022-08-14 NOTE — ED Provider Notes (Cosign Needed Addendum)
Behavioral Health Progress Note  Date and Time: 08/14/2022 12:08 PM Name: Nicole [name] MRN:  [id number]  HPI: Nicole [name] is a 31 y.o. female, with PMH of SCZ unspecified, MDD with psychosis, who presented voluntary to [facility] (07/25/2022) via [facility] after a verbal altercation with [name] (not patient's legal guardian) for transient SI.  This is patient's fourth visit to [facility] for similar concerns this year. Patient has been dismissed from previous group home due to threatening SI and HI, then leaving the group home.   Legal Guardian: Mom Nicole [name]) transitioning to be Dad Nicole [name]) Point of contact: Dad Nicole [name]) APS is following.   Subjective:  I'm okay"   Nicole [name], 30 y.o., female patient seen face to face by this provider, consulted with Dr. Nelly Rout; and chart reviewed on 08/14/22.  On evaluation Nicole [name] reports she feels okay today.  She continues to deny suicidal/self-harm/homicidal ideation, psychosis, and paranoia.  Eating and sleeping without difficulty.  Tolerating medications without adverse reaction.  Interacting with staff and peers appropriately with no behavioral outburst.   During evaluation Nicole [name] is lying in bed with no noted distress.  She is alert, oriented x 4, calm, cooperative and attentive.  Her mood is euthymic with congruent affect.  She has normal speech, and behavior.  Objectively there is no evidence of psychosis/mania or delusional thinking.  Patient is able to converse coherently, goal directed thoughts, no distractibility, or pre-occupation.  She also denies suicidal/self-harm/homicidal ideation, psychosis, and paranoia.  Patient answered question appropriately.     Diagnosis:  Final diagnoses:  Severe episode of recurrent major depressive disorder, with psychotic features (HCC)    Total Time spent with patient: 15 minutes  Past Psychiatric History: SCZ unspecified, MDD with psychosis, Anxiety. Past Medical  History:  Past Medical History:  Diagnosis Date   Anxiety    Depression    Hypothyroidism 08/07/2022   Tobacco use disorder 08/07/2022    Past Surgical History:  Procedure Laterality Date   WISDOM TOOTH EXTRACTION Bilateral 2020   Family History:  Family History  Problem Relation Age of Onset   Hypertension Father    Diabetes Father    Family Psychiatric  History: None reported Social History:  Social History   Substance and Sexual Activity  Alcohol Use Never     Social History   Substance and Sexual Activity  Drug Use Never    Social History   Socioeconomic History   Marital status: Single    Spouse name: Not on file   Number of children: Not on file   Years of education: Not on file   Highest education level: Not on file  Occupational History   Not on file  Tobacco Use   Smoking status: Every Day    Types: Cigarettes   Smokeless tobacco: Never  Vaping Use   Vaping Use: Some days  Substance and Sexual Activity   Alcohol use: Never   Drug use: Never   Sexual activity: Yes    Partners: Female    Birth control/protection: Implant  Other Topics Concern   Not on file  Social History Narrative   Not on file   Social Determinants of Health   Financial Resource Strain: Not on file  Food Insecurity: Not on file  Transportation Needs: Not on file  Physical Activity: Not on file  Stress: Not on file  Social Connections: Not on file   SDOH:  SDOH Screenings   Depression (PHQ2-9): Low Risk  (06/10/2022)  Tobacco Use: High Risk (08/07/2022)   Additional Social History:    Pain Medications: SEE MAR Prescriptions: SEE MAR Over the Counter: SEE MAR History of alcohol / drug use?: Yes Longest period of sobriety (when/how long): N/A Negative Consequences of Use:  (NONE) Withdrawal Symptoms: None Name of Substance 1: ETOH IN PAST--PT CURRENTLY DENIES 1 - Frequency: SOCIAL USE ETOH IN PAST 1 - Method of Aquiring: LEGAL 1- Route of Use: ORAL DRINK Name  of Substance 2: NICOTINE-VAPE AND CIGARETTES 2 - Amount (size/oz): LESS THAN ONE PACK 2 - Frequency: DAILY 2 - Method of Aquiring: LEGAL 2 - Route of Substance Use: ORAL SMOKE                Sleep: Good  Appetite:  Good  Current Medications:  Current Facility-Administered Medications  Medication Dose Route Frequency Provider Last Rate Last Admin   acetaminophen (TYLENOL) tablet 650 mg  650 mg Oral Q6H PRN Onuoha, Chinwendu V, NP   650 mg at 08/12/22 1846   alum & mag hydroxide-simeth (MAALOX/MYLANTA) 200-200-20 MG/5ML suspension 30 mL  30 mL Oral Q4H PRN Onuoha, Chinwendu V, NP   30 mL at 07/27/22 2151   ARIPiprazole ER (ABILIFY MAINTENA) injection 400 mg  400 mg Intramuscular Q28 days Princess Bruins, DO   400 mg at 08/02/22 1154   clotrimazole (LOTRIMIN) 1 % cream   Topical BID Bobbitt, Shalon E, NP   Given at 08/14/22 1004   hydrOXYzine (ATARAX) tablet 25 mg  25 mg Oral TID PRN Onuoha, Chinwendu V, NP   25 mg at 08/10/22 0912   ibuprofen (ADVIL) tablet 400 mg  400 mg Oral Q6H PRN Princess Bruins, DO   400 mg at 08/08/22 0854   levothyroxine (SYNTHROID) tablet 100 mcg  100 mcg Oral Q0600 Princess Bruins, DO   100 mcg at 08/14/22 0617   magnesium hydroxide (MILK OF MAGNESIA) suspension 30 mL  30 mL Oral Daily PRN Onuoha, Chinwendu V, NP   30 mL at 08/02/22 1956   nicotine (NICODERM CQ - dosed in mg/24 hours) patch 14 mg  14 mg Transdermal Q0600 Onuoha, Chinwendu V, NP   14 mg at 08/14/22 0958   ondansetron (ZOFRAN) tablet 4 mg  4 mg Oral Once Nwoko, Uchenna E, PA       Oxcarbazepine (TRILEPTAL) tablet 300 mg  300 mg Oral BID Onuoha, Chinwendu V, NP   300 mg at 08/14/22 0959   pantoprazole (PROTONIX) EC tablet 20 mg  20 mg Oral Daily Princess Bruins, DO   20 mg at 08/14/22 0959   QUEtiapine (SEROQUEL) tablet 400 mg  400 mg Oral BID Onuoha, Chinwendu V, NP   400 mg at 08/14/22 0958   senna-docusate (Senokot-S) tablet 1 tablet  1 tablet Oral QHS Princess Bruins, DO   1 tablet at 08/13/22 2120    sertraline (ZOLOFT) tablet 150 mg  150 mg Oral Daily Onuoha, Chinwendu V, NP   150 mg at 08/14/22 0959   traZODone (DESYREL) tablet 50 mg  50 mg Oral QHS PRN Onuoha, Chinwendu V, NP   50 mg at 08/13/22 2120   valACYclovir (VALTREX) tablet 500 mg  500 mg Oral Daily Princess Bruins, DO   500 mg at 08/14/22 1002   Current Outpatient Medications  Medication Sig Dispense Refill   ABILIFY MAINTENA 400 MG PRSY prefilled syringe 400 mg every 28 (twenty-eight) days.     cetirizine (ZYRTEC) 10 MG tablet Take 10 mg by mouth daily.     cyclobenzaprine (FLEXERIL)  10 MG tablet Take 1 tablet (10 mg total) by mouth 2 (two) times daily as needed for muscle spasms. 20 tablet 0   fluticasone (FLONASE) 50 MCG/ACT nasal spray Place 1 spray into both nostrils daily.     hydrOXYzine (ATARAX) 50 MG tablet Take 1 tablet (50 mg total) by mouth 2 (two) times daily. 60 tablet 0   meloxicam (MOBIC) 15 MG tablet Take 15 mg by mouth daily.     Oxcarbazepine (TRILEPTAL) 300 MG tablet Take 1 tablet (300 mg total) by mouth 2 (two) times daily. 60 tablet 0   QUEtiapine (SEROQUEL) 400 MG tablet Take 1 tablet (400 mg total) by mouth 2 (two) times daily. 60 tablet 0   sertraline (ZOLOFT) 50 MG tablet Take 3 tablets (150 mg total) by mouth in the morning. 90 tablet 0   traZODone (DESYREL) 100 MG tablet Take 1 tablet (100 mg total) by mouth at bedtime. 30 tablet 0   valACYclovir (VALTREX) 500 MG tablet Take 500 mg by mouth daily.      Labs  Lab Results:  Admission on 07/25/2022  Component Date Value Ref Range Status   SARS Coronavirus 2 by RT PCR 07/25/2022 NEGATIVE  NEGATIVE Final   Comment: (NOTE) SARS-CoV-2 target nucleic acids are NOT DETECTED.  The SARS-CoV-2 RNA is generally detectable in upper respiratory specimens during the acute phase of infection. The lowest concentration of SARS-CoV-2 viral copies this assay can detect is 138 copies/mL. A negative result does not preclude SARS-Cov-2 infection and should not be used  as the sole basis for treatment or other patient management decisions. A negative result may occur with  improper specimen collection/handling, submission of specimen other than nasopharyngeal swab, presence of viral mutation(s) within the areas targeted by this assay, and inadequate number of viral copies(<138 copies/mL). A negative result must be combined with clinical observations, patient history, and epidemiological information. The expected result is Negative.  Fact Sheet for Patients:  BloggerCourse.com  Fact Sheet for Healthcare Providers:  SeriousBroker.it  This test is no                          t yet approved or cleared by the [place] FDA and  has been authorized for detection and/or diagnosis of SARS-CoV-2 by FDA under an Emergency Use Authorization (EUA). This EUA will remain  in effect (meaning this test can be used) for the duration of the COVID-19 declaration under Section 564(b)(1) of the Act, 21 U.S.C.section 360bbb-3(b)(1), unless the authorization is terminated  or revoked sooner.       Influenza A by PCR 07/25/2022 NEGATIVE  NEGATIVE Final   Influenza B by PCR 07/25/2022 NEGATIVE  NEGATIVE Final   Comment: (NOTE) The Xpert Xpress SARS-CoV-2/FLU/RSV plus assay is intended as an aid in the diagnosis of influenza from Nasopharyngeal swab specimens and should not be used as a sole basis for treatment. Nasal washings and aspirates are unacceptable for Xpert Xpress SARS-CoV-2/FLU/RSV testing.  Fact Sheet for Patients: BloggerCourse.com  Fact Sheet for Healthcare Providers: SeriousBroker.it  This test is not yet approved or cleared by the [place] FDA and has been authorized for detection and/or diagnosis of SARS-CoV-2 by FDA under an Emergency Use Authorization (EUA). This EUA will remain in effect (meaning this test can be used) for the duration of  the COVID-19 declaration under Section 564(b)(1) of the Act, 21 U.S.C. section 360bbb-3(b)(1), unless the authorization is terminated or revoked.  Performed at [facility], [place] [address] [place], [place] [address]    WBC 07/25/2022 8.3  4.0 - 10.5 K/uL Final   RBC 07/25/2022 4.44  3.87 - 5.11 MIL/uL Final   Hemoglobin 07/25/2022 13.7  12.0 - 15.0 g/dL Final   HCT 07/25/2022 40.2  36.0 - 46.0 % Final   MCV 07/25/2022 90.5  80.0 - 100.0 fL Final   MCH 07/25/2022 30.9  26.0 - 34.0 pg Final   MCHC 07/25/2022 34.1  30.0 - 36.0 g/dL Final   RDW 07/25/2022 12.2  11.5 - 15.5 % Final   Platelets 07/25/2022 248  150 - 400 K/uL Final   nRBC 07/25/2022 0.0  0.0 - 0.2 % Final   Neutrophils Relative % 07/25/2022 43  % Final   Neutro Abs 07/25/2022 3.6  1.7 - 7.7 K/uL Final   Lymphocytes Relative 07/25/2022 52  % Final   Lymphs Abs 07/25/2022 4.2 (H)  0.7 - 4.0 K/uL Final   Monocytes Relative 07/25/2022 5  % Final   Monocytes Absolute 07/25/2022 0.4  0.1 - 1.0 K/uL Final   Eosinophils Relative 07/25/2022 0  % Final   Eosinophils Absolute 07/25/2022 0.0  0.0 - 0.5 K/uL Final   Basophils Relative 07/25/2022 0  % Final   Basophils Absolute 07/25/2022 0.0  0.0 - 0.1 K/uL Final   Immature Granulocytes 07/25/2022 0  % Final   Abs Immature Granulocytes 07/25/2022 0.02  0.00 - 0.07 K/uL Final   Performed at [facility], [place] [address], [place], [place] [address]   Sodium 07/25/2022 138  135 - 145 mmol/L Final   Potassium 07/25/2022 4.0  3.5 - 5.1 mmol/L Final   Chloride 07/25/2022 104  98 - 111 mmol/L Final   CO2 07/25/2022 29  22 - 32 mmol/L Final   Glucose, Bld 07/25/2022 83  70 - 99 mg/dL Final   Glucose reference range applies only to samples taken after fasting for at least 8 hours.   BUN 07/25/2022 11  6 - 20 mg/dL Final   Creatinine, Ser 07/25/2022 0.97  0.44 - 1.00 mg/dL Final   Calcium 07/25/2022 9.2  8.9 - 10.3 mg/dL Final   Total Protein 07/25/2022 7.0  6.5 - 8.1 g/dL  Final   Albumin 07/25/2022 3.8  3.5 - 5.0 g/dL Final   AST 07/25/2022 18  15 - 41 U/L Final   ALT 07/25/2022 22  0 - 44 U/L Final   Alkaline Phosphatase 07/25/2022 64  38 - 126 U/L Final   Total Bilirubin 07/25/2022 0.2 (L)  0.3 - 1.2 mg/dL Final   GFR, Estimated 07/25/2022 >60  >60 mL/min Final   Comment: (NOTE) Calculated using the CKD-EPI Creatinine Equation (2021)    Anion gap 07/25/2022 5  5 - 15 Final   Performed at [place] [address], [place], [place] [address]   POC Amphetamine UR 07/25/2022 None Detected  NONE DETECTED (Cut Off Level 1000 ng/mL) Preliminary   POC Secobarbital (BAR) 07/25/2022 None Detected  NONE DETECTED (Cut Off Level 300 ng/mL) Preliminary   POC Buprenorphine (BUP) 07/25/2022 None Detected  NONE DETECTED (Cut Off Level 10 ng/mL) Preliminary   POC Oxazepam (BZO) 07/25/2022 None Detected  NONE DETECTED (Cut Off Level 300 ng/mL) Preliminary   POC Cocaine UR 07/25/2022 None Detected  NONE DETECTED (Cut Off Level 300 ng/mL) Preliminary   POC Methamphetamine UR 07/25/2022 None Detected  NONE DETECTED (Cut Off Level 1000 ng/mL) Preliminary   POC Morphine 07/25/2022 None Detected  NONE DETECTED (Cut Off Level 300 ng/mL) Preliminary   POC Methadone UR 07/25/2022 None Detected  NONE DETECTED (Cut Off Level 300 ng/mL) Preliminary   POC Oxycodone UR 07/25/2022 None Detected  NONE DETECTED (Cut Off Level 100 ng/mL) Preliminary   POC Marijuana UR 07/25/2022 None Detected  NONE DETECTED (Cut Off Level 50 ng/mL) Preliminary   SARSCOV2ONAVIRUS 2 AG 07/25/2022 NEGATIVE  NEGATIVE Final   Comment: (NOTE) SARS-CoV-2 antigen NOT DETECTED.   Negative results are presumptive.  Negative results do not preclude SARS-CoV-2 infection and should not be used as the sole basis for treatment or other patient management decisions, including infection  control decisions, particularly in the presence of clinical signs and  symptoms consistent with COVID-19, or in those who have  been in contact with the virus.  Negative results must be combined with clinical observations, patient history, and epidemiological information. The expected result is Negative.  Fact Sheet for Patients: https://www.jennings-kim.com/  Fact Sheet for Healthcare Providers: https://alexander-rogers.biz/  This test is not yet approved or cleared by the [place] FDA and  has been authorized for detection and/or diagnosis of SARS-CoV-2 by FDA under an Emergency Use Authorization (EUA).  This EUA will remain in effect (meaning this test can be used) for the duration of  the COV                          ID-19 declaration under Section 564(b)(1) of the Act, 21 U.S.C. section 360bbb-3(b)(1), unless the authorization is terminated or revoked sooner.     Cholesterol 07/25/2022 181  0 - 200 mg/dL Final   Triglycerides 16/07/9603 118  <150 mg/dL Final   HDL 54/06/8118 45  >40 mg/dL Final   Total CHOL/HDL Ratio 07/25/2022 4.0  RATIO Final   VLDL 07/25/2022 24  0 - 40 mg/dL Final   LDL Cholesterol 07/25/2022 112 (H)  0 - 99 mg/dL Final   Comment:        Total Cholesterol/HDL:CHD Risk Coronary Heart Disease Risk Table                     Men   Women  1/2 Average Risk   3.4   3.3  Average Risk       5.0   4.4  2 X Average Risk   9.6   7.1  3 X Average Risk  23.4   11.0        Use the calculated Patient Ratio above and the CHD Risk Table to determine the patient's CHD Risk.        ATP III CLASSIFICATION (LDL):  <100     mg/dL   Optimal  147-829  mg/dL   Near or Above                    Optimal  130-159  mg/dL   Borderline  562-130  mg/dL   High  >865     mg/dL   Very High Performed at [facility], [address], [place], [place] [address]    TSH 07/25/2022 6.668 (H)  0.350 - 4.500 uIU/mL Final   Comment: Performed by a 3rd Generation assay with a functional sensitivity of <=0.01 uIU/mL. Performed at [facility], [address],  [place], [place] [address]    Glucose-Capillary 07/26/2022 104 (H)  70 - 99 mg/dL Final   Glucose reference range applies only to samples taken after fasting for at  least 8 hours.   T3, Free 07/31/2022 2.3  2.0 - 4.4 pg/mL Final   Comment: (NOTE) Performed At: [facility] [address] [place], [place] [id number] Jolene Schimke MD YQ:[id number]    Free T4 07/31/2022 0.60 (L)  0.61 - 1.12 ng/dL Final   Comment: (NOTE) Biotin ingestion may interfere with free T4 tests. If the results are inconsistent with the TSH level, previous test results, or the clinical presentation, then consider biotin interference. If needed, order repeat testing after stopping biotin. Performed at [facility], [address], [place], [place] [address]   Admission on 07/04/2022, Discharged on 07/05/2022  Component Date Value Ref Range Status   Sodium 07/04/2022 142  135 - 145 mmol/L Final   Potassium 07/04/2022 4.4  3.5 - 5.1 mmol/L Final   Chloride 07/04/2022 109  98 - 111 mmol/L Final   CO2 07/04/2022 26  22 - 32 mmol/L Final   Glucose, Bld 07/04/2022 96  70 - 99 mg/dL Final   Glucose reference range applies only to samples taken after fasting for at least 8 hours.   BUN 07/04/2022 15  6 - 20 mg/dL Final   Creatinine, Ser 07/04/2022 0.83  0.44 - 1.00 mg/dL Final   Calcium 32/44/0102 9.3  8.9 - 10.3 mg/dL Final   Total Protein 72/53/6644 7.2  6.5 - 8.1 g/dL Final   Albumin 03/47/4259 3.9  3.5 - 5.0 g/dL Final   AST 56/38/7564 23  15 - 41 U/L Final   ALT 07/04/2022 28  0 - 44 U/L Final   Alkaline Phosphatase 07/04/2022 77  38 - 126 U/L Final   Total Bilirubin 07/04/2022 0.4  0.3 - 1.2 mg/dL Final   GFR, Estimated 07/04/2022 >60  >60 mL/min Final   Comment: (NOTE) Calculated using the CKD-EPI Creatinine Equation (2021)    Anion gap 07/04/2022 7  5 - 15 Final   Performed at [facility] [address], [place], [place] [address]   Alcohol, Ethyl (B) 07/04/2022 <10  <10  mg/dL Final   Comment: (NOTE) Lowest detectable limit for serum alcohol is 10 mg/dL.  For medical purposes only. Performed at [facility], [address] [place], [place] [address]    WBC 07/04/2022 7.0  4.0 - 10.5 K/uL Final   RBC 07/04/2022 4.18  3.87 - 5.11 MIL/uL Final   Hemoglobin 07/04/2022 12.8  12.0 - 15.0 g/dL Final   HCT 66/03/3015 39.1  36.0 - 46.0 % Final   MCV 07/04/2022 93.5  80.0 - 100.0 fL Final   MCH 07/04/2022 30.6  26.0 - 34.0 pg Final   MCHC 07/04/2022 32.7  30.0 - 36.0 g/dL Final   RDW 10/22/3233 12.9  11.5 - 15.5 % Final   Platelets 07/04/2022 243  150 - 400 K/uL Final   nRBC 07/04/2022 0.0  0.0 - 0.2 % Final   Neutrophils Relative % 07/04/2022 43  % Final   Neutro Abs 07/04/2022 3.0  1.7 - 7.7 K/uL Final   Lymphocytes Relative 07/04/2022 50  % Final   Lymphs Abs 07/04/2022 3.5  0.7 - 4.0 K/uL Final   Monocytes Relative 07/04/2022 7  % Final   Monocytes Absolute 07/04/2022 0.5  0.1 - 1.0 K/uL Final   Eosinophils Relative 07/04/2022 0  % Final   Eosinophils Absolute 07/04/2022 0.0  0.0 - 0.5 K/uL Final   Basophils Relative 07/04/2022 0  % Final   Basophils Absolute 07/04/2022 0.0  0.0 - 0.1 K/uL Final  Immature Granulocytes 07/04/2022 0  % Final   Abs Immature Granulocytes 07/04/2022 0.01  0.00 - 0.07 K/uL Final   Performed at [facility], [address], [place], [place] [address]   I-stat hCG, quantitative 07/04/2022 <5.0  <5 mIU/mL Final   Comment 3 07/04/2022          Final   Comment:   GEST. AGE      CONC.  (mIU/mL)   <=1 WEEK        5 - 50     2 WEEKS       50 - 500     3 WEEKS       100 - 10,000     4 WEEKS     1,000 - 30,000        FEMALE AND NON-PREGNANT FEMALE:     LESS THAN 5 mIU/mL   Admission on 06/14/2022, Discharged on 06/14/2022  Component Date Value Ref Range Status   Color, UA 06/14/2022 yellow  yellow Final   Clarity, UA 06/14/2022 cloudy (A)  clear Final   Glucose, UA 06/14/2022 negative   negative mg/dL Final   Bilirubin, UA 10/22/3233 negative  negative Final   Ketones, POC UA 06/14/2022 negative  negative mg/dL Final   Spec Grav, UA 57/32/2025 1.025  1.010 - 1.025 Final   Blood, UA 06/14/2022 negative  negative Final   pH, UA 06/14/2022 7.5  5.0 - 8.0 Final   Protein Ur, POC 06/14/2022 =30 (A)  negative mg/dL Final   Urobilinogen, UA 06/14/2022 0.2  0.2 or 1.0 E.U./dL Final   Nitrite, UA 42/70/6237 Negative  Negative Final   Leukocytes, UA 06/14/2022 Negative  Negative Final   Preg Test, Ur 06/14/2022 Negative  Negative Final   Specimen Description 06/14/2022 URINE, CLEAN CATCH   Final   Special Requests 06/14/2022    Final                   Value:NONE Performed at [facility], [address], [place], [place] [address]    Culture 06/14/2022 MULTIPLE SPECIES PRESENT, SUGGEST RECOLLECTION (A)   Final   Report Status 06/14/2022 06/16/2022 FINAL   Final  Admission on 06/07/2022, Discharged on 06/10/2022  Component Date Value Ref Range Status   SARS Coronavirus 2 by RT PCR 06/07/2022 NEGATIVE  NEGATIVE Final   Comment: (NOTE) SARS-CoV-2 target nucleic acids are NOT DETECTED.  The SARS-CoV-2 RNA is generally detectable in upper respiratory specimens during the acute phase of infection. The lowest concentration of SARS-CoV-2 viral copies this assay can detect is 138 copies/mL. A negative result does not preclude SARS-Cov-2 infection and should not be used as the sole basis for treatment or other patient management decisions. A negative result may occur with  improper specimen collection/handling, submission of specimen other than nasopharyngeal swab, presence of viral mutation(s) within the areas targeted by this assay, and inadequate number of viral copies(<138 copies/mL). A negative result must be combined with clinical observations, patient history, and epidemiological information. The expected result is Negative.  Fact Sheet for Patients:   BloggerCourse.com  Fact Sheet for Healthcare Providers:  SeriousBroker.it  This test is no                          t yet approved or cleared by the [place] FDA and  has been authorized for detection and/or diagnosis of SARS-CoV-2 by FDA under an Emergency Use Authorization (EUA). This  EUA will remain  in effect (meaning this test can be used) for the duration of the COVID-19 declaration under Section 564(b)(1) of the Act, 21 U.S.C.section 360bbb-3(b)(1), unless the authorization is terminated  or revoked sooner.       Influenza A by PCR 06/07/2022 NEGATIVE  NEGATIVE Final   Influenza B by PCR 06/07/2022 NEGATIVE  NEGATIVE Final   Comment: (NOTE) The Xpert Xpress SARS-CoV-2/FLU/RSV plus assay is intended as an aid in the diagnosis of influenza from Nasopharyngeal swab specimens and should not be used as a sole basis for treatment. Nasal washings and aspirates are unacceptable for Xpert Xpress SARS-CoV-2/FLU/RSV testing.  Fact Sheet for Patients: BloggerCourse.comhttps://www.fda.gov/media/152166/download  Fact Sheet for Healthcare Providers: SeriousBroker.ithttps://www.fda.gov/media/152162/download  This test is not yet approved or cleared by the [place] FDA and has been authorized for detection and/or diagnosis of SARS-CoV-2 by FDA under an Emergency Use Authorization (EUA). This EUA will remain in effect (meaning this test can be used) for the duration of the COVID-19 declaration under Section 564(b)(1) of the Act, 21 U.S.C. section 360bbb-3(b)(1), unless the authorization is terminated or revoked.  Performed at [facility], [address] [place], [place] [address]    WBC 06/07/2022 5.7  4.0 - 10.5 K/uL Final   RBC 06/07/2022 4.39  3.87 - 5.11 MIL/uL Final   Hemoglobin 06/07/2022 13.2  12.0 - 15.0 g/dL Final   HCT 60/45/409808/25/2023 40.4  36.0 - 46.0 % Final   MCV 06/07/2022 92.0  80.0 - 100.0 fL Final   MCH 06/07/2022 30.1  26.0 - 34.0  pg Final   MCHC 06/07/2022 32.7  30.0 - 36.0 g/dL Final   RDW 11/91/478208/25/2023 12.4  11.5 - 15.5 % Final   Platelets 06/07/2022 308  150 - 400 K/uL Final   nRBC 06/07/2022 0.0  0.0 - 0.2 % Final   Neutrophils Relative % 06/07/2022 42  % Final   Neutro Abs 06/07/2022 2.4  1.7 - 7.7 K/uL Final   Lymphocytes Relative 06/07/2022 54  % Final   Lymphs Abs 06/07/2022 3.1  0.7 - 4.0 K/uL Final   Monocytes Relative 06/07/2022 4  % Final   Monocytes Absolute 06/07/2022 0.2  0.1 - 1.0 K/uL Final   Eosinophils Relative 06/07/2022 0  % Final   Eosinophils Absolute 06/07/2022 0.0  0.0 - 0.5 K/uL Final   Basophils Relative 06/07/2022 0  % Final   Basophils Absolute 06/07/2022 0.0  0.0 - 0.1 K/uL Final   Immature Granulocytes 06/07/2022 0  % Final   Abs Immature Granulocytes 06/07/2022 0.01  0.00 - 0.07 K/uL Final   [facility] [facility], [address] [place], [place] [id number]   Sodium 06/07/2022 139  135 - 145 mmol/L Final   Potassium 06/07/2022 4.0  3.5 - 5.1 mmol/L Final   Chloride 06/07/2022 104  98 - 111 mmol/L Final   CO2 06/07/2022 28  22 - 32 mmol/L Final   Glucose, Bld 06/07/2022 104 (H)  70 - 99 mg/dL Final   Glucose reference range applies only to samples taken after fasting for at least 8 hours.   BUN 06/07/2022 8  6 - 20 mg/dL Final   Creatinine, Ser 06/07/2022 0.84  0.44 - 1.00 mg/dL Final   Calcium 30/86/578408/25/2023 9.1  8.9 - 10.3 mg/dL Final   Total Protein 69/62/952808/25/2023 6.9  6.5 - 8.1 g/dL Final   Albumin 41/32/440108/25/2023 3.7  3.5 - 5.0 g/dL Final   AST 02/72/536608/25/2023 19  15 - 41 U/L  Final   ALT 06/07/2022 22  0 - 44 U/L Final   Alkaline Phosphatase 06/07/2022 54  38 - 126 U/L Final   Total Bilirubin 06/07/2022 0.4  0.3 - 1.2 mg/dL Final   GFR, Estimated 06/07/2022 >60  >60 mL/min Final   Comment: (NOTE) Calculated using the CKD-EPI Creatinine Equation (2021)    Anion gap 06/07/2022 7  5 - 15 Final   Performed at [facility], [address] [place], [place] [address]   Hgb A1c  MFr Bld 06/07/2022 5.0  4.8 - 5.6 % Final   Comment: (NOTE) Pre diabetes:          5.7%-6.4%  Diabetes:              >6.4%  Glycemic control for   <7.0% adults with diabetes    Mean Plasma Glucose 06/07/2022 96.8  mg/dL Final   Performed at [facility], [address], [place], [place] [address]   TSH 06/07/2022 1.620  0.350 - 4.500 uIU/mL Final   Comment: Performed by a 3rd Generation assay with a functional sensitivity of <=0.01 uIU/mL. Performed at [facility], [address] [place], [place] [address]    RPR Ser Ql 06/07/2022 NON REACTIVE  NON REACTIVE Final   Performed at [facility], [address], [place], [place] [address]   Color, Urine 06/07/2022 YELLOW  YELLOW Final   APPearance 06/07/2022 HAZY (A)  CLEAR Final   Specific Gravity, Urine 06/07/2022 1.018  1.005 - 1.030 Final   pH 06/07/2022 7.0  5.0 - 8.0 Final   Glucose, UA 06/07/2022 NEGATIVE  NEGATIVE mg/dL Final   Hgb urine dipstick 06/07/2022 NEGATIVE  NEGATIVE Final   Bilirubin Urine 06/07/2022 NEGATIVE  NEGATIVE Final   Ketones, ur 06/07/2022 NEGATIVE  NEGATIVE mg/dL Final   Protein, ur 26/94/8546 NEGATIVE  NEGATIVE mg/dL Final   Nitrite 27/12/5007 NEGATIVE  NEGATIVE Final   Leukocytes,Ua 06/07/2022 NEGATIVE  NEGATIVE Final   Performed at [facility], [address] [place], [place] [address]   Cholesterol 06/07/2022 164  0 - 200 mg/dL Final   Triglycerides 99/37/1696 158 (H)  <150 mg/dL Final   HDL 78/93/8101 44  >40 mg/dL Final   Total CHOL/HDL Ratio 06/07/2022 3.7  RATIO Final   VLDL 06/07/2022 32  0 - 40 mg/dL Final   LDL Cholesterol 06/07/2022 88  0 - 99 mg/dL Final   Comment:        Total Cholesterol/HDL:CHD Risk Coronary Heart Disease Risk Table                     Men   Women  1/2 Average Risk   3.4   3.3  Average Risk       5.0   4.4  2 X Average Risk   9.6   7.1  3 X Average Risk  23.4   11.0        Use the calculated Patient Ratio above and the CHD Risk Table to  determine the patient's CHD Risk.        ATP III CLASSIFICATION (LDL):  <100     mg/dL   Optimal  751-025  mg/dL   Near or Above                    Optimal  130-159  mg/dL   Borderline  852-778  mg/dL   High  >242     mg/dL  Very High Performed at [facility], [address], [place], [place] [address]    HIV Screen 4th Generation wRfx 06/07/2022 Non Reactive  Non Reactive Final   Performed at [facility], [address] [place], [place] [address]   SARSCOV2ONAVIRUS 2 AG 06/07/2022 NEGATIVE  NEGATIVE Final   Comment: (NOTE) SARS-CoV-2 antigen NOT DETECTED.   Negative results are presumptive.  Negative results do not preclude SARS-CoV-2 infection and should not be used as the sole basis for treatment or other patient management decisions, including infection  control decisions, particularly in the presence of clinical signs and  symptoms consistent with COVID-19, or in those who have been in contact with the virus.  Negative results must be combined with clinical observations, patient history, and epidemiological information. The expected result is Negative.  Fact Sheet for Patients: https://www.jennings-kim.com/  Fact Sheet for Healthcare Providers: https://alexander-rogers.biz/  This test is not yet approved or cleared by the [place] FDA and  has been authorized for detection and/or diagnosis of SARS-CoV-2 by FDA under an Emergency Use Authorization (EUA).  This EUA will remain in effect (meaning this test can be used) for the duration of  the COV                          ID-19 declaration under Section 564(b)(1) of the Act, 21 U.S.C. section 360bbb-3(b)(1), unless the authorization is terminated or revoked sooner.     POC Amphetamine UR 06/07/2022 None Detected  NONE DETECTED (Cut Off Level 1000 ng/mL) Final   POC Secobarbital (BAR) 06/07/2022 None Detected  NONE DETECTED (Cut Off Level 300 ng/mL) Final   POC Buprenorphine  (BUP) 06/07/2022 None Detected  NONE DETECTED (Cut Off Level 10 ng/mL) Final   POC Oxazepam (BZO) 06/07/2022 None Detected  NONE DETECTED (Cut Off Level 300 ng/mL) Final   POC Cocaine UR 06/07/2022 None Detected  NONE DETECTED (Cut Off Level 300 ng/mL) Final   POC Methamphetamine UR 06/07/2022 None Detected  NONE DETECTED (Cut Off Level 1000 ng/mL) Final   POC Morphine 06/07/2022 None Detected  NONE DETECTED (Cut Off Level 300 ng/mL) Final   POC Methadone UR 06/07/2022 None Detected  NONE DETECTED (Cut Off Level 300 ng/mL) Final   POC Oxycodone UR 06/07/2022 None Detected  NONE DETECTED (Cut Off Level 100 ng/mL) Final   POC Marijuana UR 06/07/2022 None Detected  NONE DETECTED (Cut Off Level 50 ng/mL) Final   Preg Test, Ur 06/08/2022 NEGATIVE  NEGATIVE Final   Comment:        THE SENSITIVITY OF THIS METHODOLOGY IS >20 mIU/mL. Performed at [facility], [address], [place], [place] [address]    Preg Test, Ur 06/08/2022 NEGATIVE  NEGATIVE Final   Comment:        THE SENSITIVITY OF THIS METHODOLOGY IS >24 mIU/mL     Blood Alcohol level:  Lab Results  Component Value Date   ETH <10 07/04/2022   ETH <10 02/07/2022    Metabolic Disorder Labs: Lab Results  Component Value Date   HGBA1C 5.0 06/07/2022   MPG 96.8 06/07/2022   MPG 96.8 02/07/2022   No results found for: "PROLACTIN" Lab Results  Component Value Date   CHOL 181 07/25/2022   TRIG 118 07/25/2022   HDL 45 07/25/2022   CHOLHDL 4.0 07/25/2022   VLDL 24 07/25/2022   LDLCALC 112 (H) 07/25/2022   LDLCALC 88 06/07/2022    Therapeutic Lab Levels:  No results found for: "LITHIUM" No results found for: "VALPROATE" No results found for: "CBMZ"  Physical Findings   GAD-7    Flowsheet Row [facility]17/[facility]  Total GAD-7 Score 17      PHQ2-9    Flowsheet Row ED [facility]07/2022 in [facility]17/[facility]  PHQ-2 Total Score 1 2  PHQ-9 Total Score 2 8      Flowsheet Row ED from 07/25/2022 in [facility] ED from 07/04/2022 in [facility] from 06/14/2022 in [facility]   C-SSRS RISK CATEGORY High Risk No Risk No Risk        Musculoskeletal  Strength & Muscle Tone: within normal limits Gait & Station: normal Patient leans: N/A  Psychiatric Specialty Exam  Presentation  General Appearance:  Appropriate for Environment  Eye Contact: Good  Speech: Clear and Coherent; Normal Rate  Speech Volume: Normal  Handedness: Right   Mood and Affect  Mood: Euthymic  Affect: Appropriate; Congruent   Thought Process  Thought Processes: Coherent; Disorganized; Linear  Descriptions of Associations:Intact  Orientation:Full (Time, Place and Person)  Thought Content:Logical  Diagnosis of Schizophrenia or Schizoaffective disorder in past: No    Hallucinations:Hallucinations: None  Ideas of Reference:None  Suicidal Thoughts:Suicidal Thoughts: No  Homicidal Thoughts:Homicidal Thoughts: No   Sensorium  Memory: Immediate Good  Judgment: Fair  Insight: Fair; Present   Executive Functions  Concentration: Good  Attention Span: Good  Recall: Good  Fund of Knowledge: Good  Language: Good   Psychomotor Activity  Psychomotor Activity:Psychomotor Activity: Normal   Assets  Assets: Communication Skills; Desire for Improvement   Sleep  Sleep:Sleep: Good   Nutritional Assessment (For OBS and [facility]BC admissions only) Has the patient had a weight loss or gain of 10 pounds or more in the last 3 months?: No Has the patient had a decrease in food intake/or appetite?: No Does the patient have dental problems?: No Has the patient recently lost weight without trying?: 0 Has the patient been eating poorly because of a decreased appetite?:  0 Malnutrition Screening Tool Score: 0    Physical Exam  Physical Exam Constitutional:      General: She is not in acute distress.    Appearance: Normal appearance. She is not ill-appearing, toxic-appearing or diaphoretic.  Eyes:     General: No scleral icterus.       Right eye: No discharge.        Left eye: No discharge.  Pulmonary:     Effort: Pulmonary effort is normal. No respiratory distress.  Skin:    General: Skin is warm and dry.     Coloration: Skin is not jaundiced or pale.  Neurological:     Mental Status: She is alert and oriented to person, place, and time.  Psychiatric:        Attention and Perception: Attention and perception normal.        Mood and Affect: Mood and affect normal.        Speech: Speech normal.        Behavior: Behavior normal. Behavior is cooperative.        Thought Content: Thought content normal.    Review of Systems  Eyes:  Negative for blurred vision and double vision.  Respiratory:  Negative for shortness of breath.  Positive for difficulty breathing when laying down   Cardiovascular:  Negative for chest pain.  Gastrointestinal:  Negative for abdominal pain, constipation, diarrhea, nausea and vomiting.  Musculoskeletal:  Negative for myalgias.   Blood pressure 109/89, pulse (!) 114, temperature 98 F (36.7 C), temperature source Oral, resp. rate 20, SpO2 98 %. There is no height or weight on file to calculate BMI.  Treatment Plan Summary: Plan Patient continues to be psychiatrically cleared.  Social work to continue working with DSS for appropriate placement   [employer], NP 08/14/2022 12:08 PM

## 2022-08-15 NOTE — ED Notes (Signed)
Pt is sleeping. No distress noted. Will continue to monitor safety. 

## 2022-08-15 NOTE — ED Provider Notes (Signed)
Behavioral Health Progress Note  Date and Time: 08/15/2022 9:01 AM Name: Nicole [name] MRN:  [id number]  Subjective:   Nicole [name] is a 31 y.o. female, with PMH of SCZ unspecified, MDD with psychosis, who presented voluntary to [facility] (07/25/2022) via [facility] after a verbal altercation with [name] (not patient's legal guardian) for transient SI.  This is patient's fourth visit to [facility] for similar concerns this year. Patient has been dismissed from previous group home due to threatening SI and HI, then leaving the group home.   Legal Guardian: [name]) transitioning to be [name]) Point of contact: [name]) APS is following.   On reassessment, pt is a&ox3, in no acute distress, non-toxic appearing. She is calm, cooperative, attentive, pleasant. She reports euthymic mood. She denies mood disturbances, including depression, anxiety, euphoria. She denies SI/VI/HI, AVH, paranoia. There is no evidence of agitation, aggression, distractibility. No delusions or paranoia elicited. Pt remains psychiatrically cleared. Disposition is pending placement. Per notes, pt's legal guardian is mother, and father is in the process of having legal guardianship switched into his name. Psychological assessment is pending. Referral for placement has been submitted to [facility] and application can be processed once assessment is completed and submitted to [facility].  Diagnosis:  Final diagnoses:  Severe episode of recurrent major depressive disorder, with psychotic features ([place])    Total Time spent with patient: 15 minutes  Past Psychiatric History: SCZ unspecified, MDD with psychosis, Anxiety Past Medical History:  Past Medical History:  Diagnosis Date   Anxiety    Depression    Hypothyroidism 08/07/2022   Tobacco use disorder 08/07/2022    Past Surgical History:  Procedure Laterality Date   WISDOM TOOTH EXTRACTION Bilateral 2020   Family History:  Family History   Problem Relation Age of Onset   Hypertension Father    Diabetes Father    Family Psychiatric  History: None reported Social History:  Social History   Substance and Sexual Activity  Alcohol Use Never     Social History   Substance and Sexual Activity  Drug Use Never    Social History   Socioeconomic History   Marital status: Single    Spouse name: Not on file   Number of children: Not on file   Years of education: Not on file   Highest education level: Not on file  Occupational History   Not on file  Tobacco Use   Smoking status: Every Day    Types: Cigarettes   Smokeless tobacco: Never  Vaping Use   Vaping Use: Some days  Substance and Sexual Activity   Alcohol use: Never   Drug use: Never   Sexual activity: Yes    Partners: Female    Birth control/protection: Implant  Other Topics Concern   Not on file  Social History Narrative   Not on file   Social Determinants of Health   Financial Resource Strain: Not on file  Food Insecurity: Not on file  Transportation Needs: Not on file  Physical Activity: Not on file  Stress: Not on file  Social Connections: Not on file   SDOH:  SDOH Screenings   Depression (PHQ2-9): Low Risk  (06/10/2022)  Tobacco Use: High Risk (08/07/2022)   Additional Social History:    Pain Medications: SEE MAR Prescriptions: SEE MAR Over the Counter: SEE MAR History of alcohol / drug use?: Yes Longest period of sobriety (when/how long): N/A Negative Consequences of Use:  (NONE) Withdrawal Symptoms: None Name  of Substance 1: ETOH IN PAST--PT CURRENTLY DENIES 1 - Frequency: SOCIAL USE ETOH IN PAST 1 - Method of Aquiring: LEGAL 1- Route of Use: ORAL DRINK Name of Substance 2: [place] 2 - Amount (size/oz): LESS THAN ONE PACK 2 - Frequency: DAILY 2 - Method of Aquiring: LEGAL 2 - Route of Substance Use: ORAL SMOKE                Sleep: Good  Appetite:  Good  Current Medications:  Current  Facility-Administered Medications  Medication Dose Route Frequency Provider Last Rate Last Admin   acetaminophen (TYLENOL) tablet 650 mg  650 mg Oral Q6H PRN Onuoha, Chinwendu V, NP   650 mg at 08/12/22 1846   alum & mag hydroxide-simeth (MAALOX/MYLANTA) 200-200-20 MG/5ML suspension 30 mL  30 mL Oral Q4H PRN Onuoha, Chinwendu V, NP   30 mL at 07/27/22 2151   ARIPiprazole ER (ABILIFY MAINTENA) injection 400 mg  400 mg Intramuscular Q28 days Merrily Brittle, DO   400 mg at 08/02/22 1154   clotrimazole (LOTRIMIN) 1 % cream   Topical BID Bobbitt, Shalon E, NP   1 Application at [id number]   hydrOXYzine (ATARAX) tablet 25 mg  25 mg Oral TID PRN Onuoha, Chinwendu V, NP   25 mg at 08/10/22 0912   ibuprofen (ADVIL) tablet 400 mg  400 mg Oral Q6H PRN Merrily Brittle, DO   400 mg at 08/08/22 0854   levothyroxine (SYNTHROID) tablet 100 mcg  100 mcg Oral Q0600 Merrily Brittle, DO   100 mcg at 08/15/22 0603   magnesium hydroxide (MILK OF MAGNESIA) suspension 30 mL  30 mL Oral Daily PRN Onuoha, Chinwendu V, NP   30 mL at 08/02/22 1956   nicotine (NICODERM CQ - dosed in mg/24 hours) patch 14 mg  14 mg Transdermal Q0600 Onuoha, Chinwendu V, NP   14 mg at 08/14/22 0958   ondansetron (ZOFRAN) tablet 4 mg  4 mg Oral Once Nwoko, Uchenna E, PA       Oxcarbazepine (TRILEPTAL) tablet 300 mg  300 mg Oral BID Onuoha, Chinwendu V, NP   300 mg at 08/14/22 2101   pantoprazole (PROTONIX) EC tablet 20 mg  20 mg Oral Daily Merrily Brittle, DO   20 mg at 08/14/22 0959   QUEtiapine (SEROQUEL) tablet 400 mg  400 mg Oral BID Onuoha, Chinwendu V, NP   400 mg at 08/14/22 2101   senna-docusate (Senokot-S) tablet 1 tablet  1 tablet Oral QHS Merrily Brittle, DO   1 tablet at 08/14/22 2102   sertraline (ZOLOFT) tablet 150 mg  150 mg Oral Daily Onuoha, Chinwendu V, NP   150 mg at 08/14/22 0959   traZODone (DESYREL) tablet 50 mg  50 mg Oral QHS PRN Onuoha, Chinwendu V, NP   50 mg at 08/14/22 2101   valACYclovir (VALTREX) tablet 500 mg  500 mg Oral  Daily Merrily Brittle, DO   500 mg at 08/14/22 1002   Current Outpatient Medications  Medication Sig Dispense Refill   ABILIFY MAINTENA 400 MG PRSY prefilled syringe 400 mg every 28 (twenty-eight) days.     cetirizine (ZYRTEC) 10 MG tablet Take 10 mg by mouth daily.     cyclobenzaprine (FLEXERIL) 10 MG tablet Take 1 tablet (10 mg total) by mouth 2 (two) times daily as needed for muscle spasms. 20 tablet 0   fluticasone (FLONASE) 50 MCG/ACT nasal spray Place 1 spray into both nostrils daily.     hydrOXYzine (ATARAX) 50 MG tablet Take  1 tablet (50 mg total) by mouth 2 (two) times daily. 60 tablet 0   meloxicam (MOBIC) 15 MG tablet Take 15 mg by mouth daily.     Oxcarbazepine (TRILEPTAL) 300 MG tablet Take 1 tablet (300 mg total) by mouth 2 (two) times daily. 60 tablet 0   QUEtiapine (SEROQUEL) 400 MG tablet Take 1 tablet (400 mg total) by mouth 2 (two) times daily. 60 tablet 0   sertraline (ZOLOFT) 50 MG tablet Take 3 tablets (150 mg total) by mouth in the morning. 90 tablet 0   traZODone (DESYREL) 100 MG tablet Take 1 tablet (100 mg total) by mouth at bedtime. 30 tablet 0   valACYclovir (VALTREX) 500 MG tablet Take 500 mg by mouth daily.      Labs  Lab Results:  Admission on 07/25/2022  Component Date Value Ref Range Status   SARS Coronavirus 2 by RT PCR 07/25/2022 NEGATIVE  NEGATIVE Final   Comment: (NOTE) SARS-CoV-2 target nucleic acids are NOT DETECTED.  The SARS-CoV-2 RNA is generally detectable in upper respiratory specimens during the acute phase of infection. The lowest concentration of SARS-CoV-2 viral copies this assay can detect is 138 copies/mL. A negative result does not preclude SARS-Cov-2 infection and should not be used as the sole basis for treatment or other patient management decisions. A negative result may occur with  improper specimen collection/handling, submission of specimen other than nasopharyngeal swab, presence of viral mutation(s) within the areas targeted  by this assay, and inadequate number of viral copies(<138 copies/mL). A negative result must be combined with clinical observations, patient history, and epidemiological information. The expected result is Negative.  Fact Sheet for Patients:  EntrepreneurPulse.com.au  Fact Sheet for Healthcare Providers:  IncredibleEmployment.be  This test is no                          t yet approved or cleared by the [place] FDA and  has been authorized for detection and/or diagnosis of SARS-CoV-2 by FDA under an Emergency Use Authorization (EUA). This EUA will remain  in effect (meaning this test can be used) for the duration of the COVID-19 declaration under Section 564(b)(1) of the Act, 21 U.S.C.section 360bbb-3(b)(1), unless the authorization is terminated  or revoked sooner.       Influenza A by PCR 07/25/2022 NEGATIVE  NEGATIVE Final   Influenza B by PCR 07/25/2022 NEGATIVE  NEGATIVE Final   Comment: (NOTE) The Xpert Xpress SARS-CoV-2/FLU/RSV plus assay is intended as an aid in the diagnosis of influenza from Nasopharyngeal swab specimens and should not be used as a sole basis for treatment. Nasal washings and aspirates are unacceptable for Xpert Xpress SARS-CoV-2/FLU/RSV testing.  Fact Sheet for Patients: EntrepreneurPulse.com.au  Fact Sheet for Healthcare Providers: IncredibleEmployment.be  This test is not yet approved or cleared by the [place] FDA and has been authorized for detection and/or diagnosis of SARS-CoV-2 by FDA under an Emergency Use Authorization (EUA). This EUA will remain in effect (meaning this test can be used) for the duration of the COVID-19 declaration under Section 564(b)(1) of the Act, 21 U.S.C. section 360bbb-3(b)(1), unless the authorization is terminated or revoked.  Performed at [facility], [place] [address], [place], [place] [address]    WBC 07/25/2022 8.3  4.0 -  10.5 K/uL Final   RBC 07/25/2022 4.44  3.87 - 5.11 MIL/uL Final   Hemoglobin 07/25/2022 13.7  12.0 - 15.0 g/dL Final   HCT 07/25/2022 40.2  36.0 - 46.0 % Final   MCV 07/25/2022 90.5  80.0 - 100.0 fL Final   MCH 07/25/2022 30.9  26.0 - 34.0 pg Final   MCHC 07/25/2022 34.1  30.0 - 36.0 g/dL Final   RDW 07/25/2022 12.2  11.5 - 15.5 % Final   Platelets 07/25/2022 248  150 - 400 K/uL Final   nRBC 07/25/2022 0.0  0.0 - 0.2 % Final   Neutrophils Relative % 07/25/2022 43  % Final   Neutro Abs 07/25/2022 3.6  1.7 - 7.7 K/uL Final   Lymphocytes Relative 07/25/2022 52  % Final   Lymphs Abs 07/25/2022 4.2 (H)  0.7 - 4.0 K/uL Final   Monocytes Relative 07/25/2022 5  % Final   Monocytes Absolute 07/25/2022 0.4  0.1 - 1.0 K/uL Final   Eosinophils Relative 07/25/2022 0  % Final   Eosinophils Absolute 07/25/2022 0.0  0.0 - 0.5 K/uL Final   Basophils Relative 07/25/2022 0  % Final   Basophils Absolute 07/25/2022 0.0  0.0 - 0.1 K/uL Final   Immature Granulocytes 07/25/2022 0  % Final   Abs Immature Granulocytes 07/25/2022 0.02  0.00 - 0.07 K/uL Final   Performed at [facility], [place] [address], [place], [place] [address]   Sodium 07/25/2022 138  135 - 145 mmol/L Final   Potassium 07/25/2022 4.0  3.5 - 5.1 mmol/L Final   Chloride 07/25/2022 104  98 - 111 mmol/L Final   CO2 07/25/2022 29  22 - 32 mmol/L Final   Glucose, Bld 07/25/2022 83  70 - 99 mg/dL Final   Glucose reference range applies only to samples taken after fasting for at least 8 hours.   BUN 07/25/2022 11  6 - 20 mg/dL Final   Creatinine, Ser 07/25/2022 0.97  0.44 - 1.00 mg/dL Final   Calcium 07/25/2022 9.2  8.9 - 10.3 mg/dL Final   Total Protein 07/25/2022 7.0  6.5 - 8.1 g/dL Final   Albumin 07/25/2022 3.8  3.5 - 5.0 g/dL Final   AST 07/25/2022 18  15 - 41 U/L Final   ALT 07/25/2022 22  0 - 44 U/L Final   Alkaline Phosphatase 07/25/2022 64  38 - 126 U/L Final   Total Bilirubin 07/25/2022 0.2 (L)  0.3 - 1.2 mg/dL Final   GFR,  Estimated 07/25/2022 >60  >60 mL/min Final   Comment: (NOTE) Calculated using the CKD-EPI Creatinine Equation (2021)    Anion gap 07/25/2022 5  5 - 15 Final   Performed at [place] [address], [place], [place] [address]   POC Amphetamine UR 07/25/2022 None Detected  NONE DETECTED (Cut Off Level 1000 ng/mL) Preliminary   POC Secobarbital (BAR) 07/25/2022 None Detected  NONE DETECTED (Cut Off Level 300 ng/mL) Preliminary   POC Buprenorphine (BUP) 07/25/2022 None Detected  NONE DETECTED (Cut Off Level 10 ng/mL) Preliminary   POC Oxazepam (BZO) 07/25/2022 None Detected  NONE DETECTED (Cut Off Level 300 ng/mL) Preliminary   POC Cocaine UR 07/25/2022 None Detected  NONE DETECTED (Cut Off Level 300 ng/mL) Preliminary   POC Methamphetamine UR 07/25/2022 None Detected  NONE DETECTED (Cut Off Level 1000 ng/mL) Preliminary   POC Morphine 07/25/2022 None Detected  NONE DETECTED (Cut Off Level 300 ng/mL) Preliminary   POC Methadone UR 07/25/2022 None Detected  NONE DETECTED (Cut Off Level 300 ng/mL) Preliminary   POC Oxycodone UR 07/25/2022 None Detected  NONE DETECTED (Cut Off Level 100 ng/mL) Preliminary   POC Marijuana UR 07/25/2022 None  Detected  NONE DETECTED (Cut Off Level 50 ng/mL) Preliminary   SARSCOV2ONAVIRUS 2 AG 07/25/2022 NEGATIVE  NEGATIVE Final   Comment: (NOTE) SARS-CoV-2 antigen NOT DETECTED.   Negative results are presumptive.  Negative results do not preclude SARS-CoV-2 infection and should not be used as the sole basis for treatment or other patient management decisions, including infection  control decisions, particularly in the presence of clinical signs and  symptoms consistent with COVID-19, or in those who have been in contact with the virus.  Negative results must be combined with clinical observations, patient history, and epidemiological information. The expected result is Negative.  Fact Sheet for Patients: HandmadeRecipes.com.cy  Fact  Sheet for Healthcare Providers: FuneralLife.at  This test is not yet approved or cleared by the [place] FDA and  has been authorized for detection and/or diagnosis of SARS-CoV-2 by FDA under an Emergency Use Authorization (EUA).  This EUA will remain in effect (meaning this test can be used) for the duration of  the COV                          ID-19 declaration under Section 564(b)(1) of the Act, 21 U.S.C. section 360bbb-3(b)(1), unless the authorization is terminated or revoked sooner.     Cholesterol 07/25/2022 181  0 - 200 mg/dL Final   Triglycerides 07/25/2022 118  <150 mg/dL Final   HDL 07/25/2022 45  >40 mg/dL Final   Total CHOL/HDL Ratio 07/25/2022 4.0  RATIO Final   VLDL 07/25/2022 24  0 - 40 mg/dL Final   LDL Cholesterol 07/25/2022 112 (H)  0 - 99 mg/dL Final   Comment:        Total Cholesterol/HDL:CHD Risk Coronary Heart Disease Risk Table                     Men   Women  1/2 Average Risk   3.4   3.3  Average Risk       5.0   4.4  2 X Average Risk   9.6   7.1  3 X Average Risk  23.4   11.0        Use the calculated Patient Ratio above and the CHD Risk Table to determine the patient's CHD Risk.        ATP III CLASSIFICATION (LDL):  <100     mg/dL   Optimal  100-129  mg/dL   Near or Above                    Optimal  130-159  mg/dL   Borderline  160-189  mg/dL   High  >190     mg/dL   Very High Performed at [facility] [address], [place], [place] [address]    TSH 07/25/2022 6.668 (H)  0.350 - 4.500 uIU/mL Final   Comment: Performed by a 3rd Generation assay with a functional sensitivity of <=0.01 uIU/mL. Performed at [facility], [place] [address], [place], [place] [address]    Glucose-Capillary 07/26/2022 104 (H)  70 - 99 mg/dL Final   Glucose reference range applies only to samples taken after fasting for at least 8 hours.   T3, Free 07/31/2022 2.3  2.0 - 4.4 pg/mL Final   Comment: (NOTE) Performed At: [facility] [place], [place] [id number] Rush Farmer MD [id number]    Free T4 07/31/2022 0.60 (L)  0.61 - 1.12 ng/dL Final  Comment: (NOTE) Biotin ingestion may interfere with free T4 tests. If the results are inconsistent with the TSH level, previous test results, or the clinical presentation, then consider biotin interference. If needed, order repeat testing after stopping biotin. Performed at [facility], [place] [address], [place], [place] [address]   Admission on 07/04/2022, Discharged on 07/05/2022  Component Date Value Ref Range Status   Sodium 07/04/2022 142  135 - 145 mmol/L Final   Potassium 07/04/2022 4.4  3.5 - 5.1 mmol/L Final   Chloride 07/04/2022 109  98 - 111 mmol/L Final   CO2 07/04/2022 26  22 - 32 mmol/L Final   Glucose, Bld 07/04/2022 96  70 - 99 mg/dL Final   Glucose reference range applies only to samples taken after fasting for at least 8 hours.   BUN 07/04/2022 15  6 - 20 mg/dL Final   Creatinine, Ser 07/04/2022 0.83  0.44 - 1.00 mg/dL Final   Calcium 07/04/2022 9.3  8.9 - 10.3 mg/dL Final   Total Protein 07/04/2022 7.2  6.5 - 8.1 g/dL Final   Albumin 07/04/2022 3.9  3.5 - 5.0 g/dL Final   AST 07/04/2022 23  15 - 41 U/L Final   ALT 07/04/2022 28  0 - 44 U/L Final   Alkaline Phosphatase 07/04/2022 77  38 - 126 U/L Final   Total Bilirubin 07/04/2022 0.4  0.3 - 1.2 mg/dL Final   GFR, Estimated 07/04/2022 >60  >60 mL/min Final   Comment: (NOTE) Calculated using the CKD-EPI Creatinine Equation (2021)    Anion gap 07/04/2022 7  5 - 15 Final   Performed at [facility], [place] [address] [place], [place] [address]   Alcohol, Ethyl (B) 07/04/2022 <10  <10 mg/dL Final   Comment: (NOTE) Lowest detectable limit for serum alcohol is 10 mg/dL.  For medical purposes only. Performed at [facility], [place] [address], [place], [place] [address]    WBC 07/04/2022 7.0  4.0 - 10.5 K/uL Final   RBC  07/04/2022 4.18  3.87 - 5.11 MIL/uL Final   Hemoglobin 07/04/2022 12.8  12.0 - 15.0 g/dL Final   HCT 07/04/2022 39.1  36.0 - 46.0 % Final   MCV 07/04/2022 93.5  80.0 - 100.0 fL Final   MCH 07/04/2022 30.6  26.0 - 34.0 pg Final   MCHC 07/04/2022 32.7  30.0 - 36.0 g/dL Final   RDW 07/04/2022 12.9  11.5 - 15.5 % Final   Platelets 07/04/2022 243  150 - 400 K/uL Final   nRBC 07/04/2022 0.0  0.0 - 0.2 % Final   Neutrophils Relative % 07/04/2022 43  % Final   Neutro Abs 07/04/2022 3.0  1.7 - 7.7 K/uL Final   Lymphocytes Relative 07/04/2022 50  % Final   Lymphs Abs 07/04/2022 3.5  0.7 - 4.0 K/uL Final   Monocytes Relative 07/04/2022 7  % Final   Monocytes Absolute 07/04/2022 0.5  0.1 - 1.0 K/uL Final   Eosinophils Relative 07/04/2022 0  % Final   Eosinophils Absolute 07/04/2022 0.0  0.0 - 0.5 K/uL Final   Basophils Relative 07/04/2022 0  % Final   Basophils Absolute 07/04/2022 0.0  0.0 - 0.1 K/uL Final   Immature Granulocytes 07/04/2022 0  % Final   Abs Immature Granulocytes 07/04/2022 0.01  0.00 - 0.07 K/uL Final   Performed at [facility], [place] [address] [place], [place] [address]   I-stat hCG, quantitative 07/04/2022 <5.0  <5 mIU/mL Final  Comment 3 07/04/2022          Final   Comment:   GEST. AGE      CONC.  (mIU/mL)   <=1 WEEK        5 - 50     2 WEEKS       50 - 500     3 WEEKS       100 - 10,000     4 WEEKS     1,000 - 30,000        FEMALE AND NON-PREGNANT FEMALE:     LESS THAN 5 mIU/mL   Admission on 06/14/2022, Discharged on 06/14/2022  Component Date Value Ref Range Status   Color, UA 06/14/2022 yellow  yellow Final   Clarity, UA 06/14/2022 cloudy (A)  clear Final   Glucose, UA 06/14/2022 negative  negative mg/dL Final   Bilirubin, UA 06/14/2022 negative  negative Final   Ketones, POC UA 06/14/2022 negative  negative mg/dL Final   Spec Grav, UA 06/14/2022 1.025  1.010 - 1.025 Final   Blood, UA 06/14/2022 negative  negative Final   pH, UA 06/14/2022 7.5   5.0 - 8.0 Final   Protein Ur, POC 06/14/2022 =30 (A)  negative mg/dL Final   Urobilinogen, UA 06/14/2022 0.2  0.2 or 1.0 E.U./dL Final   Nitrite, UA 06/14/2022 Negative  Negative Final   Leukocytes, UA 06/14/2022 Negative  Negative Final   Preg Test, Ur 06/14/2022 Negative  Negative Final   Specimen Description 06/14/2022 URINE, CLEAN CATCH   Final   Special Requests 06/14/2022    Final                   Value:NONE Performed at [facility], [place] [address] [place], [place] [address]    Culture 06/14/2022 MULTIPLE SPECIES PRESENT, SUGGEST RECOLLECTION (A)   Final   Report Status 06/14/2022 06/16/2022 FINAL   Final  Admission on 06/07/2022, Discharged on 06/10/2022  Component Date Value Ref Range Status   SARS Coronavirus 2 by RT PCR 06/07/2022 NEGATIVE  NEGATIVE Final   Comment: (NOTE) SARS-CoV-2 target nucleic acids are NOT DETECTED.  The SARS-CoV-2 RNA is generally detectable in upper respiratory specimens during the acute phase of infection. The lowest concentration of SARS-CoV-2 viral copies this assay can detect is 138 copies/mL. A negative result does not preclude SARS-Cov-2 infection and should not be used as the sole basis for treatment or other patient management decisions. A negative result may occur with  improper specimen collection/handling, submission of specimen other than nasopharyngeal swab, presence of viral mutation(s) within the areas targeted by this assay, and inadequate number of viral copies(<138 copies/mL). A negative result must be combined with clinical observations, patient history, and epidemiological information. The expected result is Negative.  Fact Sheet for Patients:  EntrepreneurPulse.com.au  Fact Sheet for Healthcare Providers:  IncredibleEmployment.be  This test is no                          t yet approved or cleared by the [place] FDA and  has been authorized for detection and/or diagnosis of  SARS-CoV-2 by FDA under an Emergency Use Authorization (EUA). This EUA will remain  in effect (meaning this test can be used) for the duration of the COVID-19 declaration under Section 564(b)(1) of the Act, 21 U.S.C.section 360bbb-3(b)(1), unless the authorization is terminated  or revoked sooner.       Influenza A by PCR 06/07/2022  NEGATIVE  NEGATIVE Final   Influenza B by PCR 06/07/2022 NEGATIVE  NEGATIVE Final   Comment: (NOTE) The Xpert Xpress SARS-CoV-2/FLU/RSV plus assay is intended as an aid in the diagnosis of influenza from Nasopharyngeal swab specimens and should not be used as a sole basis for treatment. Nasal washings and aspirates are unacceptable for Xpert Xpress SARS-CoV-2/FLU/RSV testing.  Fact Sheet for Patients: BloggerCourse.com  Fact Sheet for Healthcare Providers: SeriousBroker.it  This test is not yet approved or cleared by the [place] FDA and has been authorized for detection and/or diagnosis of SARS-CoV-2 by FDA under an Emergency Use Authorization (EUA). This EUA will remain in effect (meaning this test can be used) for the duration of the COVID-19 declaration under Section 564(b)(1) of the Act, 21 U.S.C. section 360bbb-3(b)(1), unless the authorization is terminated or revoked.  Performed at [facility], [address], [place], [place] [address]    WBC 06/07/2022 5.7  4.0 - 10.5 K/uL Final   RBC 06/07/2022 4.39  3.87 - 5.11 MIL/uL Final   Hemoglobin 06/07/2022 13.2  12.0 - 15.0 g/dL Final   HCT 35/36/1443 40.4  36.0 - 46.0 % Final   MCV 06/07/2022 92.0  80.0 - 100.0 fL Final   MCH 06/07/2022 30.1  26.0 - 34.0 pg Final   MCHC 06/07/2022 32.7  30.0 - 36.0 g/dL Final   RDW 15/40/0867 12.4  11.5 - 15.5 % Final   Platelets 06/07/2022 308  150 - 400 K/uL Final   nRBC 06/07/2022 0.0  0.0 - 0.2 % Final   Neutrophils Relative % 06/07/2022 42  % Final   Neutro Abs 06/07/2022 2.4  1.7 - 7.7  K/uL Final   Lymphocytes Relative 06/07/2022 54  % Final   Lymphs Abs 06/07/2022 3.1  0.7 - 4.0 K/uL Final   Monocytes Relative 06/07/2022 4  % Final   Monocytes Absolute 06/07/2022 0.2  0.1 - 1.0 K/uL Final   Eosinophils Relative 06/07/2022 0  % Final   Eosinophils Absolute 06/07/2022 0.0  0.0 - 0.5 K/uL Final   Basophils Relative 06/07/2022 0  % Final   Basophils Absolute 06/07/2022 0.0  0.0 - 0.1 K/uL Final   Immature Granulocytes 06/07/2022 0  % Final   Abs Immature Granulocytes 06/07/2022 0.01  0.00 - 0.07 K/uL Final   Performed at [facility], [address], [place], [place] [address]   Sodium 06/07/2022 139  135 - 145 mmol/L Final   Potassium 06/07/2022 4.0  3.5 - 5.1 mmol/L Final   Chloride 06/07/2022 104  98 - 111 mmol/L Final   CO2 06/07/2022 28  22 - 32 mmol/L Final   Glucose, Bld 06/07/2022 104 (H)  70 - 99 mg/dL Final   Glucose reference range applies only to samples taken after fasting for at least 8 hours.   BUN 06/07/2022 8  6 - 20 mg/dL Final   Creatinine, Ser 06/07/2022 0.84  0.44 - 1.00 mg/dL Final   Calcium 93/26/7124 9.1  8.9 - 10.3 mg/dL Final   Total Protein 58/06/9832 6.9  6.5 - 8.1 g/dL Final   Albumin 82/50/5397 3.7  3.5 - 5.0 g/dL Final   AST 67/34/1937 19  15 - 41 U/L Final   ALT 06/07/2022 22  0 - 44 U/L Final   Alkaline Phosphatase 06/07/2022 54  38 - 126 U/L Final   Total Bilirubin 06/07/2022 0.4  0.3 - 1.2 mg/dL Final   GFR, Estimated 06/07/2022 >60  >60 mL/min Final  Comment: (NOTE) Calculated using the CKD-EPI Creatinine Equation (2021)    Anion gap 06/07/2022 7  5 - 15 Final   Performed at [facility], [place] [address] [place], [place] [address]   Hgb A1c MFr Bld 06/07/2022 5.0  4.8 - 5.6 % Final   Comment: (NOTE) Pre diabetes:          5.7%-6.4%  Diabetes:              >6.4%  Glycemic control for   <7.0% adults with diabetes    Mean Plasma Glucose 06/07/2022 96.8  mg/dL Final   Performed at [facility], [place]  [address], [place], [place] [address]   TSH 06/07/2022 1.620  0.350 - 4.500 uIU/mL Final   Comment: Performed by a 3rd Generation assay with a functional sensitivity of <=0.01 uIU/mL. Performed at [facility], [place] [address], [place], [place] [address]    RPR Ser Ql 06/07/2022 NON REACTIVE  NON REACTIVE Final   Performed at [facility], [place] [address], [place], [place] [address]   Color, Urine 06/07/2022 YELLOW  YELLOW Final   APPearance 06/07/2022 HAZY (A)  CLEAR Final   Specific Gravity, Urine 06/07/2022 1.018  1.005 - 1.030 Final   pH 06/07/2022 7.0  5.0 - 8.0 Final   Glucose, UA 06/07/2022 NEGATIVE  NEGATIVE mg/dL Final   Hgb urine dipstick 06/07/2022 NEGATIVE  NEGATIVE Final   Bilirubin Urine 06/07/2022 NEGATIVE  NEGATIVE Final   Ketones, ur 06/07/2022 NEGATIVE  NEGATIVE mg/dL Final   Protein, ur 06/07/2022 NEGATIVE  NEGATIVE mg/dL Final   Nitrite 06/07/2022 NEGATIVE  NEGATIVE Final   Leukocytes,Ua 06/07/2022 NEGATIVE  NEGATIVE Final   Performed at [facility], [place] [address] [place], [place] [address]   Cholesterol 06/07/2022 164  0 - 200 mg/dL Final   Triglycerides 06/07/2022 158 (H)  <150 mg/dL Final   HDL 06/07/2022 44  >40 mg/dL Final   Total CHOL/HDL Ratio 06/07/2022 3.7  RATIO Final   VLDL 06/07/2022 32  0 - 40 mg/dL Final   LDL Cholesterol 06/07/2022 88  0 - 99 mg/dL Final   Comment:        Total Cholesterol/HDL:CHD Risk Coronary Heart Disease Risk Table                     Men   Women  1/2 Average Risk   3.4   3.3  Average Risk       5.0   4.4  2 X Average Risk   9.6   7.1  3 X Average Risk  23.4   11.0        Use the calculated Patient Ratio above and the CHD Risk Table to determine the patient's CHD Risk.        ATP III CLASSIFICATION (LDL):  <100     mg/dL   Optimal  100-129  mg/dL   Near or Above                    Optimal  130-159  mg/dL   Borderline  160-189  mg/dL   High  >190     mg/dL   Very High Performed at [place] [address] [place], [place] [address]    HIV Screen 4th Generation wRfx 06/07/2022 Non Reactive  Non Reactive Final   Performed at [facility], [place] [address], [place], [place] [address]  SARSCOV2ONAVIRUS 2 AG 06/07/2022 NEGATIVE  NEGATIVE Final   Comment: (NOTE) SARS-CoV-2 antigen NOT DETECTED.   Negative results are presumptive.  Negative results do not preclude SARS-CoV-2 infection and should not be used as the sole basis for treatment or other patient management decisions, including infection  control decisions, particularly in the presence of clinical signs and  symptoms consistent with COVID-19, or in those who have been in contact with the virus.  Negative results must be combined with clinical observations, patient history, and epidemiological information. The expected result is Negative.  Fact Sheet for Patients: HandmadeRecipes.com.cy  Fact Sheet for Healthcare Providers: FuneralLife.at  This test is not yet approved or cleared by the [place] FDA and  has been authorized for detection and/or diagnosis of SARS-CoV-2 by FDA under an Emergency Use Authorization (EUA).  This EUA will remain in effect (meaning this test can be used) for the duration of  the COV                          ID-19 declaration under Section 564(b)(1) of the Act, 21 U.S.C. section 360bbb-3(b)(1), unless the authorization is terminated or revoked sooner.     POC Amphetamine UR 06/07/2022 None Detected  NONE DETECTED (Cut Off Level 1000 ng/mL) Final   POC Secobarbital (BAR) 06/07/2022 None Detected  NONE DETECTED (Cut Off Level 300 ng/mL) Final   POC Buprenorphine (BUP) 06/07/2022 None Detected  NONE DETECTED (Cut Off Level 10 ng/mL) Final   POC Oxazepam (BZO) 06/07/2022 None Detected  NONE DETECTED (Cut Off Level 300 ng/mL) Final   POC Cocaine UR 06/07/2022 None Detected  NONE DETECTED (Cut Off Level 300 ng/mL) Final   POC Methamphetamine UR  06/07/2022 None Detected  NONE DETECTED (Cut Off Level 1000 ng/mL) Final   POC Morphine 06/07/2022 None Detected  NONE DETECTED (Cut Off Level 300 ng/mL) Final   POC Methadone UR 06/07/2022 None Detected  NONE DETECTED (Cut Off Level 300 ng/mL) Final   POC Oxycodone UR 06/07/2022 None Detected  NONE DETECTED (Cut Off Level 100 ng/mL) Final   POC Marijuana UR 06/07/2022 None Detected  NONE DETECTED (Cut Off Level 50 ng/mL) Final   Preg Test, Ur 06/08/2022 NEGATIVE  NEGATIVE Final   Comment:        THE SENSITIVITY OF THIS METHODOLOGY IS >20 mIU/mL. Performed at [facility], [place] [address], [place], [place] [address]    Preg Test, Ur 06/08/2022 NEGATIVE  NEGATIVE Final   Comment:        THE SENSITIVITY OF THIS METHODOLOGY IS >24 mIU/mL     Blood Alcohol level:  Lab Results  Component Value Date   ETH <10 07/04/2022   ETH <10 [id number]    Metabolic Disorder Labs: Lab Results  Component Value Date   HGBA1C 5.0 06/07/2022   MPG 96.8 06/07/2022   MPG 96.8 02/07/2022   No results found for: "PROLACTIN" Lab Results  Component Value Date   CHOL 181 07/25/2022   TRIG 118 07/25/2022   HDL 45 07/25/2022   CHOLHDL 4.0 07/25/2022   VLDL 24 07/25/2022   LDLCALC 112 (H) 07/25/2022   LDLCALC 88 06/07/2022    Therapeutic Lab Levels: No results found for: "LITHIUM" No results found for: "VALPROATE" No results found for: "CBMZ"  Physical Findings   GAD-7    [facility] Office Visit from 12/17/2021 in [facility]  Total GAD-7 Score 17  [facility] ED from 06/07/2022 in [facility] Office Visit from 12/17/2021 in [facility]  PHQ-2 Total Score 1 2  PHQ-9 Total Score 2 8      Flowsheet [place] [facility] from 07/25/2022 in [facility] from 07/04/2022 in [facility] ED from 06/14/2022 in [facility] High Risk No Risk No Risk        Musculoskeletal  Strength & Muscle Tone: within normal limits Gait & Station: normal Patient leans: N/A  Psychiatric Specialty Exam  Presentation  General Appearance:  Appropriate for Environment  Eye Contact: Good  Speech: Clear and Coherent; Normal Rate  Speech Volume: Normal  Handedness: Right   Mood and Affect  Mood: Euthymic  Affect: Appropriate; Congruent   Thought Process  Thought Processes: Coherent  Descriptions of Associations:Intact  Orientation:Full (Time, Place and Person)  Thought Content:Logical  Diagnosis of Schizophrenia or Schizoaffective disorder in past: No    Hallucinations:Hallucinations: None  Ideas of Reference:None  Suicidal Thoughts:Suicidal Thoughts: No  Homicidal Thoughts:Homicidal Thoughts: No   Sensorium  Memory: Immediate Good  Judgment: Fair  Insight: Fair   Community education officer  Concentration: Good  Attention Span: Good  Recall: Good  Fund of Knowledge: Good  Language: Good   Psychomotor Activity  Psychomotor Activity: Psychomotor Activity: Normal   Assets  Assets: Communication Skills; Desire for Improvement   Sleep  Sleep: Sleep: Good   Nutritional Assessment (For OBS and [facility] admissions only) Has the patient had a weight loss or gain of 10 pounds or more in the last 3 months?: No Has the patient had a decrease in food intake/or appetite?: No Does the patient have dental problems?: No Has the patient recently lost weight without trying?: 0 Has the patient been eating poorly because of a decreased appetite?: 0 Malnutrition Screening Tool Score: 0    Physical Exam  Physical Exam Constitutional:      General: She is not in acute distress.    Appearance: She is not ill-appearing, toxic-appearing or diaphoretic.  Eyes:     General: No scleral icterus.       Right eye: No discharge.        Left eye: No  discharge.  Pulmonary:     Effort: Pulmonary effort is normal. No respiratory distress.  Neurological:     Mental Status: She is alert and oriented to person, place, and time.  Psychiatric:        Attention and Perception: Attention and perception normal.        Mood and Affect: Mood and affect normal.        Speech: Speech normal.        Behavior: Behavior normal. Behavior is cooperative.        Thought Content: Thought content normal.    Review of Systems  Constitutional:  Negative for chills and fever.  Respiratory:  Negative for shortness of breath.   Cardiovascular:  Negative for chest pain and palpitations.  Gastrointestinal:  Negative for abdominal pain.  Neurological:  Negative for headaches.  Psychiatric/Behavioral: Negative.     Blood pressure 102/83, pulse (!) 111, temperature 98.1 F (36.7 C), temperature source Oral, resp. rate 18, SpO2 100 %. There is no height or weight on file to calculate BMI.  Treatment Plan Summary: Plan Pt is psychiatrically cleared. Dispo is pending placement.   Tharon Aquas,  NP 08/15/2022 9:01 AM

## 2022-08-15 NOTE — ED Notes (Signed)
Pt sleeping at present, no distress noted.  Monitoring for safety. 

## 2022-08-15 NOTE — ED Notes (Signed)
Pt A&O x 4, no distress noted watching TV at present.  Calm & cooperative.  Monitoring for safety.

## 2022-08-16 NOTE — ED Notes (Signed)
Snacks given 

## 2022-08-16 NOTE — ED Notes (Signed)
Pt sleeping at present, no distress noted,  Monitoring for safety. 

## 2022-08-16 NOTE — ED Provider Notes (Signed)
Behavioral Health Progress Note  Date and Time: 08/16/2022 10:43 AM Name: Nicole [name] MRN:  [id number]  Subjective:   Nicole [name] is a 31 y.o. female, with PMH of SCZ unspecified, MDD with psychosis, who [facility] via [facility] after a verbal altercation with Jeannett SeniorJosephine Okeke (not patient's legal guardian) for transient SI.  This is [facility] similar concerns this year. Patient has been dismissed from previous group home due to threatening SI and HI, then leaving the group home.   Legal Guardian: [name](Maxine Brown) transitioning to be [name](Albert Brown) Point of contact: [name](Albert Brown)  On reassessment, pt is sitting up on my approach, affect brightens on approach. Pt is a&ox3, in no acute distress, non-toxic appearing. She is calm, cooperative, attentive, pleasant. She reports euthymic mood. She denies mood disturbances, including depression, anxiety, euphoria. She denies SI/VI/HI, AVH, paranoia. There is no evidence of agitation, aggression, distractibility. No delusions or paranoia elicited. Pt remains psychiatrically cleared. Disposition is pending placement. Per notes, pt's legal guardian is mother, and father is in the process of having legal guardianship switched into his name. Psychological assessment is pending. Dr. Melvyn NethLewis is scheduled to conduct psychological assessment on Monday, 08/15/22 at [facility]. Referral for placement has been submitted to [facility] and application can be processed once assessment is completed and [facility] to [facility].   Diagnosis:  Final diagnoses:  Severe episode of recurrent major depressive disorder, with psychotic features (HCC)    Total Time spent with patient: 20 minutes  Past Psychiatric History: SCZ unspecified, MDD with psychosis, Anxiety Past Medical History:  Past Medical History:  Diagnosis Date   Anxiety    Depression    Hypothyroidism 08/07/2022   Tobacco use disorder 08/07/2022     Past Surgical History:  Procedure Laterality Date   WISDOM TOOTH EXTRACTION Bilateral 2020   Family History:  Family History  Problem Relation Age of Onset   Hypertension Father    Diabetes Father    Family Psychiatric  History: None reported Social History:  Social History   Substance and Sexual Activity  Alcohol Use Never     Social History   Substance and Sexual Activity  Drug Use Never    Social History   Socioeconomic History   Marital status: Single    Spouse name: Not on file   Number of children: Not on file   Years of education: Not on file   Highest education level: Not on file  Occupational History   Not on file  Tobacco Use   Smoking status: Every Day    Types: Cigarettes   Smokeless tobacco: Never  Vaping Use   Vaping Use: Some days  Substance and Sexual Activity   Alcohol use: Never   Drug use: Never   Sexual activity: Yes    Partners: Female    Birth control/protection: Implant  Other Topics Concern   Not on file  Social History Narrative   Not on file   Social Determinants of Health   Financial Resource Strain: Not on file  Food Insecurity: Not on file  Transportation Needs: Not on file  Physical Activity: Not on file  Stress: Not on file  Social Connections: Not on file   SDOH:  SDOH Screenings   Depression (PHQ2-9): Low Risk  (06/10/2022)  Tobacco Use: High Risk (08/07/2022)   Additional Social History:    Pain Medications: SEE MAR Prescriptions: SEE MAR Over the Counter: SEE MAR History of alcohol /  drug use?: Yes Longest period of sobriety (when/how long): N/A Negative Consequences of Use:  (NONE) Withdrawal Symptoms: None Name of Substance 1: ETOH IN PAST--PT CURRENTLY DENIES 1 - Frequency: SOCIAL USE ETOH IN PAST 1 - Method of Aquiring: LEGAL 1- Route of Use: ORAL DRINK Name of Substance 2: [place] 2 - Amount (size/oz): LESS THAN ONE PACK 2 - Frequency: DAILY 2 - Method of Aquiring: LEGAL 2 -  Route of Substance Use: ORAL SMOKE                Sleep: Good  Appetite:  Good  Current Medications:  Current Facility-Administered Medications  Medication Dose Route Frequency Provider Last Rate Last Admin   acetaminophen (TYLENOL) tablet 650 mg  650 mg Oral Q6H PRN Onuoha, Chinwendu V, NP   650 mg at 08/15/22 1820   alum & mag hydroxide-simeth (MAALOX/MYLANTA) 200-200-20 MG/5ML suspension 30 mL  30 mL Oral Q4H PRN Onuoha, Chinwendu V, NP   30 mL at 07/27/22 2151   ARIPiprazole ER (ABILIFY MAINTENA) injection 400 mg  400 mg Intramuscular Q28 days Merrily Brittle, DO   400 mg at 08/02/22 1154   clotrimazole (LOTRIMIN) 1 % cream   Topical BID Bobbitt, Shalon E, NP   Given at 08/15/22 2156   hydrOXYzine (ATARAX) tablet 25 mg  25 mg Oral TID PRN Onuoha, Chinwendu V, NP   25 mg at 08/15/22 2157   ibuprofen (ADVIL) tablet 400 mg  400 mg Oral Q6H PRN Merrily Brittle, DO   400 mg at 08/08/22 0854   levothyroxine (SYNTHROID) tablet 100 mcg  100 mcg Oral Q0600 Merrily Brittle, DO   100 mcg at 08/16/22 0601   magnesium hydroxide (MILK OF MAGNESIA) suspension 30 mL  30 mL Oral Daily PRN Onuoha, Chinwendu V, NP   30 mL at 08/02/22 1956   nicotine (NICODERM CQ - dosed in mg/24 hours) patch 14 mg  14 mg Transdermal Q0600 Onuoha, Chinwendu V, NP   14 mg at 08/16/22 1015   ondansetron (ZOFRAN) tablet 4 mg  4 mg Oral Once Nwoko, Uchenna E, PA       Oxcarbazepine (TRILEPTAL) tablet 300 mg  300 mg Oral BID Onuoha, Chinwendu V, NP   300 mg at 08/16/22 1015   pantoprazole (PROTONIX) EC tablet 20 mg  20 mg Oral Daily Merrily Brittle, DO   20 mg at 08/16/22 1015   QUEtiapine (SEROQUEL) tablet 400 mg  400 mg Oral BID Onuoha, Chinwendu V, NP   400 mg at 08/16/22 1015   senna-docusate (Senokot-S) tablet 1 tablet  1 tablet Oral QHS Merrily Brittle, DO   1 tablet at 08/15/22 2157   sertraline (ZOLOFT) tablet 150 mg  150 mg Oral Daily Onuoha, Chinwendu V, NP   150 mg at 08/16/22 1015   traZODone (DESYREL) tablet 50 mg  50  mg Oral QHS PRN Onuoha, Chinwendu V, NP   50 mg at 08/15/22 2157   valACYclovir (VALTREX) tablet 500 mg  500 mg Oral Daily Merrily Brittle, DO   500 mg at 08/16/22 1015   Current Outpatient Medications  Medication Sig Dispense Refill   ABILIFY MAINTENA 400 MG PRSY prefilled syringe 400 mg every 28 (twenty-eight) days.     cetirizine (ZYRTEC) 10 MG tablet Take 10 mg by mouth daily.     cyclobenzaprine (FLEXERIL) 10 MG tablet Take 1 tablet (10 mg total) by mouth 2 (two) times daily as needed for muscle spasms. 20 tablet 0   fluticasone (FLONASE) 50 MCG/ACT  nasal spray Place 1 spray into both nostrils daily.     hydrOXYzine (ATARAX) 50 MG tablet Take 1 tablet (50 mg total) by mouth 2 (two) times daily. 60 tablet 0   meloxicam (MOBIC) 15 MG tablet Take 15 mg by mouth daily.     Oxcarbazepine (TRILEPTAL) 300 MG tablet Take 1 tablet (300 mg total) by mouth 2 (two) times daily. 60 tablet 0   QUEtiapine (SEROQUEL) 400 MG tablet Take 1 tablet (400 mg total) by mouth 2 (two) times daily. 60 tablet 0   sertraline (ZOLOFT) 50 MG tablet Take 3 tablets (150 mg total) by mouth in the morning. 90 tablet 0   traZODone (DESYREL) 100 MG tablet Take 1 tablet (100 mg total) by mouth at bedtime. 30 tablet 0   valACYclovir (VALTREX) 500 MG tablet Take 500 mg by mouth daily.      Labs  Lab Results:  Admission on 07/25/2022  Component Date Value Ref Range Status   SARS Coronavirus 2 by RT PCR 07/25/2022 NEGATIVE  NEGATIVE Final   Comment: (NOTE) SARS-CoV-2 target nucleic acids are NOT DETECTED.  The SARS-CoV-2 RNA is generally detectable in upper respiratory specimens during the acute phase of infection. The lowest concentration of SARS-CoV-2 viral copies this assay can detect is 138 copies/mL. A negative result does not preclude SARS-Cov-2 infection and should not be used as the sole basis for treatment or other patient management decisions. A negative result may occur with  improper specimen  collection/handling, submission of specimen other than nasopharyngeal swab, presence of viral mutation(s) within the areas targeted by this assay, and inadequate number of viral copies(<138 copies/mL). A negative result must be combined with clinical observations, patient history, and epidemiological information. The expected result is Negative.  Fact Sheet for Patients:  BloggerCourse.com  Fact Sheet for Healthcare Providers:  SeriousBroker.it  This test is no                          t yet approved or cleared by the [place] FDA and  has been authorized for detection and/or diagnosis of SARS-CoV-2 by FDA under an Emergency Use Authorization (EUA). This EUA will remain  in effect (meaning this test can be used) for the duration of the COVID-19 declaration under Section 564(b)(1) of the Act, 21 U.S.C.section 360bbb-3(b)(1), unless the authorization is terminated  or revoked sooner.       Influenza A by PCR 07/25/2022 NEGATIVE  NEGATIVE Final   Influenza B by PCR 07/25/2022 NEGATIVE  NEGATIVE Final   Comment: (NOTE) The Xpert Xpress SARS-CoV-2/FLU/RSV plus assay is intended as an aid in the diagnosis of influenza from Nasopharyngeal swab specimens and should not be used as a sole basis for treatment. Nasal washings and aspirates are unacceptable for Xpert Xpress SARS-CoV-2/FLU/RSV testing.  Fact Sheet for Patients: BloggerCourse.com  Fact Sheet for Healthcare Providers: SeriousBroker.it  This test is not yet approved or cleared by the [place] FDA and has been authorized for detection and/or diagnosis of SARS-CoV-2 by FDA under an Emergency Use Authorization (EUA). This EUA will remain in effect (meaning this test can be used) for the duration of the COVID-19 declaration under Section 564(b)(1) of the Act, 21 U.S.C. section 360bbb-3(b)(1), unless the authorization is  terminated or revoked.  Performed at [facility], [address] [place], [place] [address]    WBC 07/25/2022 8.3  4.0 - 10.5 K/uL Final   RBC 07/25/2022 4.44  3.87 -  5.11 MIL/uL Final   Hemoglobin 07/25/2022 13.7  12.0 - 15.0 g/dL Final   HCT 00/93/8182 40.2  36.0 - 46.0 % Final   MCV 07/25/2022 90.5  80.0 - 100.0 fL Final   MCH 07/25/2022 30.9  26.0 - 34.0 pg Final   MCHC 07/25/2022 34.1  30.0 - 36.0 g/dL Final   RDW 99/37/1696 12.2  11.5 - 15.5 % Final   Platelets 07/25/2022 248  150 - 400 K/uL Final   nRBC 07/25/2022 0.0  0.0 - 0.2 % Final   Neutrophils Relative % 07/25/2022 43  % Final   Neutro Abs 07/25/2022 3.6  1.7 - 7.7 K/uL Final   Lymphocytes Relative 07/25/2022 52  % Final   Lymphs Abs 07/25/2022 4.2 (H)  0.7 - 4.0 K/uL Final   Monocytes Relative 07/25/2022 5  % Final   Monocytes Absolute 07/25/2022 0.4  0.1 - 1.0 K/uL Final   Eosinophils Relative 07/25/2022 0  % Final   Eosinophils Absolute 07/25/2022 0.0  0.0 - 0.5 K/uL Final   Basophils Relative 07/25/2022 0  % Final   Basophils Absolute 07/25/2022 0.0  0.0 - 0.1 K/uL Final   Immature Granulocytes 07/25/2022 0  % Final   Abs Immature Granulocytes 07/25/2022 0.02  0.00 - 0.07 K/uL Final   Performed at [facility], [address] [place], [place] [address]   Sodium 07/25/2022 138  135 - 145 mmol/L Final   Potassium 07/25/2022 4.0  3.5 - 5.1 mmol/L Final   Chloride 07/25/2022 104  98 - 111 mmol/L Final   CO2 07/25/2022 29  22 - 32 mmol/L Final   Glucose, Bld 07/25/2022 83  70 - 99 mg/dL Final   Glucose reference range applies only to samples taken after fasting for at least 8 hours.   BUN 07/25/2022 11  6 - 20 mg/dL Final   Creatinine, Ser 07/25/2022 0.97  0.44 - 1.00 mg/dL Final   Calcium 07/30/5101 9.2  8.9 - 10.3 mg/dL Final   Total Protein 58/52/7782 7.0  6.5 - 8.1 g/dL Final   Albumin 42/35/3614 3.8  3.5 - 5.0 g/dL Final   AST 43/15/4008 18  15 - 41 U/L Final   ALT 07/25/2022 22  0 - 44 U/L  Final   Alkaline Phosphatase 07/25/2022 64  38 - 126 U/L Final   Total Bilirubin 07/25/2022 0.2 (L)  0.3 - 1.2 mg/dL Final   GFR, Estimated 07/25/2022 >60  >60 mL/min Final   Comment: (NOTE) Calculated using the CKD-EPI Creatinine Equation (2021)    Anion gap 07/25/2022 5  5 - 15 Final   Performed at [facility], [address], [place], [place] [address]   POC Amphetamine UR 07/25/2022 None Detected  NONE DETECTED (Cut Off Level 1000 ng/mL) Preliminary   POC Secobarbital (BAR) 07/25/2022 None Detected  NONE DETECTED (Cut Off Level 300 ng/mL) Preliminary   POC Buprenorphine (BUP) 07/25/2022 None Detected  NONE DETECTED (Cut Off Level 10 ng/mL) Preliminary   POC Oxazepam (BZO) 07/25/2022 None Detected  NONE DETECTED (Cut Off Level 300 ng/mL) Preliminary   POC Cocaine UR 07/25/2022 None Detected  NONE DETECTED (Cut Off Level 300 ng/mL) Preliminary   POC Methamphetamine UR 07/25/2022 None Detected  NONE DETECTED (Cut Off Level 1000 ng/mL) Preliminary   POC Morphine 07/25/2022 None Detected  NONE DETECTED (Cut Off Level 300 ng/mL) Preliminary   POC Methadone UR 07/25/2022 None Detected  NONE DETECTED (Cut Off Level 300 ng/mL) Preliminary   POC Oxycodone  UR 07/25/2022 None Detected  NONE DETECTED (Cut Off Level 100 ng/mL) Preliminary   POC Marijuana UR 07/25/2022 None Detected  NONE DETECTED (Cut Off Level 50 ng/mL) Preliminary   SARSCOV2ONAVIRUS 2 AG 07/25/2022 NEGATIVE  NEGATIVE Final   Comment: (NOTE) SARS-CoV-2 antigen NOT DETECTED.   Negative results are presumptive.  Negative results do not preclude SARS-CoV-2 infection and should not be used as the sole basis for treatment or other patient management decisions, including infection  control decisions, particularly in the presence of clinical signs and  symptoms consistent with COVID-19, or in those who have been in contact with the virus.  Negative results must be combined with clinical observations, patient history, and  epidemiological information. The expected result is Negative.  Fact Sheet for Patients: https://www.jennings-kim.com/  Fact Sheet for Healthcare Providers: https://alexander-rogers.biz/  This test is not yet approved or cleared by the [place] FDA and  has been authorized for detection and/or diagnosis of SARS-CoV-2 by FDA under an Emergency Use Authorization (EUA).  This EUA will remain in effect (meaning this test can be used) for the duration of  the COV                          ID-19 declaration under Section 564(b)(1) of the Act, 21 U.S.C. section 360bbb-3(b)(1), unless the authorization is terminated or revoked sooner.     Cholesterol 07/25/2022 181  0 - 200 mg/dL Final   Triglycerides 78/46/9629 118  <150 mg/dL Final   HDL 52/84/1324 45  >40 mg/dL Final   Total CHOL/HDL Ratio 07/25/2022 4.0  RATIO Final   VLDL 07/25/2022 24  0 - 40 mg/dL Final   LDL Cholesterol 07/25/2022 112 (H)  0 - 99 mg/dL Final   Comment:        Total Cholesterol/HDL:CHD Risk Coronary Heart Disease Risk Table                     Men   Women  1/2 Average Risk   3.4   3.3  Average Risk       5.0   4.4  2 X Average Risk   9.6   7.1  3 X Average Risk  23.4   11.0        Use the calculated Patient Ratio above and the CHD Risk Table to determine the patient's CHD Risk.        ATP III CLASSIFICATION (LDL):  <100     mg/dL   Optimal  401-027  mg/dL   Near or Above                    Optimal  130-159  mg/dL   Borderline  253-664  mg/dL   High  >403     mg/dL   Very High Performed at [facility], [address] [place], [place] [address]    TSH 07/25/2022 6.668 (H)  0.350 - 4.500 uIU/mL Final   Comment: Performed by a 3rd Generation assay with a functional sensitivity of <=0.01 uIU/mL. Performed at [facility], [address] [place], [place] [address]    Glucose-Capillary 07/26/2022 104 (H)  70 - 99 mg/dL Final   Glucose reference range applies only to  samples taken after fasting for at least 8 hours.   T3, Free 07/31/2022 2.3  2.0 - 4.4 pg/mL Final   Comment: (NOTE) Performed At: [facility] [address] [place], [place]  [id number] Jolene Schimke MD JS:[id number]    Free T4 07/31/2022 0.60 (L)  0.61 - 1.12 ng/dL Final   Comment: (NOTE) Biotin ingestion may interfere with free T4 tests. If the results are inconsistent with the TSH level, previous test results, or the clinical presentation, then consider biotin interference. If needed, order repeat testing after stopping biotin. Performed at [facility], [address], [place], [place] [address]   Admission on 07/04/2022, Discharged on 07/05/2022  Component Date Value Ref Range Status   Sodium 07/04/2022 142  135 - 145 mmol/L Final   Potassium 07/04/2022 4.4  3.5 - 5.1 mmol/L Final   Chloride 07/04/2022 109  98 - 111 mmol/L Final   CO2 07/04/2022 26  22 - 32 mmol/L Final   Glucose, Bld 07/04/2022 96  70 - 99 mg/dL Final   Glucose reference range applies only to samples taken after fasting for at least 8 hours.   BUN 07/04/2022 15  6 - 20 mg/dL Final   Creatinine, Ser 07/04/2022 0.83  0.44 - 1.00 mg/dL Final   Calcium 04/08/9484 9.3  8.9 - 10.3 mg/dL Final   Total Protein 46/27/0350 7.2  6.5 - 8.1 g/dL Final   Albumin 09/38/1829 3.9  3.5 - 5.0 g/dL Final   AST 93/71/6967 23  15 - 41 U/L Final   ALT 07/04/2022 28  0 - 44 U/L Final   Alkaline Phosphatase 07/04/2022 77  38 - 126 U/L Final   Total Bilirubin 07/04/2022 0.4  0.3 - 1.2 mg/dL Final   GFR, Estimated 07/04/2022 >60  >60 mL/min Final   Comment: (NOTE) Calculated using the CKD-EPI Creatinine Equation (2021)    Anion gap 07/04/2022 7  5 - 15 Final   Performed at [facility], [address] [place], [place] [address]   Alcohol, Ethyl (B) 07/04/2022 <10  <10 mg/dL Final   Comment: (NOTE) Lowest detectable limit for serum alcohol is 10 mg/dL.  For medical purposes only. Performed at  [facility], [address] [place], [place] [address]    WBC 07/04/2022 7.0  4.0 - 10.5 K/uL Final   RBC 07/04/2022 4.18  3.87 - 5.11 MIL/uL Final   Hemoglobin 07/04/2022 12.8  12.0 - 15.0 g/dL Final   HCT 02/58/5277 39.1  36.0 - 46.0 % Final   MCV 07/04/2022 93.5  80.0 - 100.0 fL Final   MCH 07/04/2022 30.6  26.0 - 34.0 pg Final   MCHC 07/04/2022 32.7  30.0 - 36.0 g/dL Final   RDW 82/42/3536 12.9  11.5 - 15.5 % Final   Platelets 07/04/2022 243  150 - 400 K/uL Final   nRBC 07/04/2022 0.0  0.0 - 0.2 % Final   Neutrophils Relative % 07/04/2022 43  % Final   Neutro Abs 07/04/2022 3.0  1.7 - 7.7 K/uL Final   Lymphocytes Relative 07/04/2022 50  % Final   Lymphs Abs 07/04/2022 3.5  0.7 - 4.0 K/uL Final   Monocytes Relative 07/04/2022 7  % Final   Monocytes Absolute 07/04/2022 0.5  0.1 - 1.0 K/uL Final   Eosinophils Relative 07/04/2022 0  % Final   Eosinophils Absolute 07/04/2022 0.0  0.0 - 0.5 K/uL Final   Basophils Relative 07/04/2022 0  % Final   Basophils Absolute 07/04/2022 0.0  0.0 - 0.1 K/uL Final   Immature Granulocytes 07/04/2022 0  % Final   Abs Immature Granulocytes 07/04/2022 0.01  0.00 - 0.07 K/uL Final   Performed at [facility], [address], [place], [place] [address]   I-stat hCG, quantitative 07/04/2022 <5.0  <5 mIU/mL Final   Comment 3 07/04/2022          Final   Comment:   GEST. AGE      CONC.  (mIU/mL)   <=1 WEEK        5 - 50     2 WEEKS       50 - 500     3 WEEKS       100 - 10,000     4 WEEKS     1,000 - 30,000        FEMALE AND NON-PREGNANT FEMALE:     LESS THAN 5 mIU/mL   Admission on 06/14/2022, Discharged on 06/14/2022  Component Date Value Ref Range Status   Color, UA 06/14/2022 yellow  yellow Final   Clarity, UA 06/14/2022 cloudy (A)  clear Final   Glucose, UA 06/14/2022 negative  negative mg/dL Final   Bilirubin, UA 16/38/4665 negative  negative Final   Ketones, POC UA 06/14/2022 negative  negative mg/dL  Final   Spec Grav, UA 06/14/2022 1.025  1.010 - 1.025 Final   Blood, UA 06/14/2022 negative  negative Final   pH, UA 06/14/2022 7.5  5.0 - 8.0 Final   Protein Ur, POC 06/14/2022 =30 (A)  negative mg/dL Final   Urobilinogen, UA 06/14/2022 0.2  0.2 or 1.0 E.U./dL Final   Nitrite, UA 99/35/7017 Negative  Negative Final   Leukocytes, UA 06/14/2022 Negative  Negative Final   Preg Test, Ur 06/14/2022 Negative  Negative Final   Specimen Description 06/14/2022 URINE, CLEAN CATCH   Final   Special Requests 06/14/2022    Final                   Value:NONE Performed at [facility], [address], [place], [place] [address]    Culture 06/14/2022 MULTIPLE SPECIES PRESENT, SUGGEST RECOLLECTION (A)   Final   Report Status 06/14/2022 06/16/2022 FINAL   Final  Admission on 06/07/2022, Discharged on 06/10/2022  Component Date Value Ref Range Status   SARS Coronavirus 2 by RT PCR 06/07/2022 NEGATIVE  NEGATIVE Final   Comment: (NOTE) SARS-CoV-2 target nucleic acids are NOT DETECTED.  The SARS-CoV-2 RNA is generally detectable in upper respiratory specimens during the acute phase of infection. The lowest concentration of SARS-CoV-2 viral copies this assay can detect is 138 copies/mL. A negative result does not preclude SARS-Cov-2 infection and should not be used as the sole basis for treatment or other patient management decisions. A negative result may occur with  improper specimen collection/handling, submission of specimen other than nasopharyngeal swab, presence of viral mutation(s) within the areas targeted by this assay, and inadequate number of viral copies(<138 copies/mL). A negative result must be combined with clinical observations, patient history, and epidemiological information. The expected result is Negative.  Fact Sheet for Patients:  BloggerCourse.com  Fact Sheet for Healthcare Providers:  SeriousBroker.it  This test is no                           t yet approved or cleared by the [place] FDA and  has been authorized for detection and/or diagnosis of SARS-CoV-2 by FDA under an Emergency Use Authorization (EUA). This EUA will remain  in effect (meaning this test can be used) for the duration of the COVID-19 declaration under Section 564(b)(1) of the Act, 21 U.S.C.section  360bbb-3(b)(1), unless the authorization is terminated  or revoked sooner.       Influenza A by PCR 06/07/2022 NEGATIVE  NEGATIVE Final   Influenza B by PCR 06/07/2022 NEGATIVE  NEGATIVE Final   Comment: (NOTE) The Xpert Xpress SARS-CoV-2/FLU/RSV plus assay is intended as an aid in the diagnosis of influenza from Nasopharyngeal swab specimens and should not be used as a sole basis for treatment. Nasal washings and aspirates are unacceptable for Xpert Xpress SARS-CoV-2/FLU/RSV testing.  Fact Sheet for Patients: BloggerCourse.com  Fact Sheet for Healthcare Providers: SeriousBroker.it  This test is not yet approved or cleared by the [place] FDA and has been authorized for detection and/or diagnosis of SARS-CoV-2 by FDA under an Emergency Use Authorization (EUA). This EUA will remain in effect (meaning this test can be used) for the duration of the COVID-19 declaration under Section 564(b)(1) of the Act, 21 U.S.C. section 360bbb-3(b)(1), unless the authorization is terminated or revoked.  Performed at [facility], [address] [place], [place] [address]    WBC 06/07/2022 5.7  4.0 - 10.5 K/uL Final   RBC 06/07/2022 4.39  3.87 - 5.11 MIL/uL Final   Hemoglobin 06/07/2022 13.2  12.0 - 15.0 g/dL Final   HCT 60/45/4098 40.4  36.0 - 46.0 % Final   MCV 06/07/2022 92.0  80.0 - 100.0 fL Final   MCH 06/07/2022 30.1  26.0 - 34.0 pg Final   MCHC 06/07/2022 32.7  30.0 - 36.0 g/dL Final   RDW 11/91/4782 12.4  11.5 - 15.5 % Final   Platelets 06/07/2022 308  150 - 400 K/uL  Final   nRBC 06/07/2022 0.0  0.0 - 0.2 % Final   Neutrophils Relative % 06/07/2022 42  % Final   Neutro Abs 06/07/2022 2.4  1.7 - 7.7 K/uL Final   Lymphocytes Relative 06/07/2022 54  % Final   Lymphs Abs 06/07/2022 3.1  0.7 - 4.0 K/uL Final   Monocytes Relative 06/07/2022 4  % Final   Monocytes Absolute 06/07/2022 0.2  0.1 - 1.0 K/uL Final   Eosinophils Relative 06/07/2022 0  % Final   Eosinophils Absolute 06/07/2022 0.0  0.0 - 0.5 K/uL Final   Basophils Relative 06/07/2022 0  % Final   Basophils Absolute 06/07/2022 0.0  0.0 - 0.1 K/uL Final   Immature Granulocytes 06/07/2022 0  % Final   Abs Immature Granulocytes 06/07/2022 0.01  0.00 - 0.07 K/uL Final   Performed at [facility], [address] [place], [place] [address]   Sodium 06/07/2022 139  135 - 145 mmol/L Final   Potassium 06/07/2022 4.0  3.5 - 5.1 mmol/L Final   Chloride 06/07/2022 104  98 - 111 mmol/L Final   CO2 06/07/2022 28  22 - 32 mmol/L Final   Glucose, Bld 06/07/2022 104 (H)  70 - 99 mg/dL Final   Glucose reference range applies only to samples taken after fasting for at least 8 hours.   BUN 06/07/2022 8  6 - 20 mg/dL Final   Creatinine, Ser 06/07/2022 0.84  0.44 - 1.00 mg/dL Final   Calcium 30/86/5784 9.1  8.9 - 10.3 mg/dL Final   Total Protein 69/62/9528 6.9  6.5 - 8.1 g/dL Final   Albumin 41/32/4401 3.7  3.5 - 5.0 g/dL Final   AST 02/72/5366 19  15 - 41 U/L Final   ALT 06/07/2022 22  0 - 44 U/L Final   Alkaline Phosphatase 06/07/2022 54  38 - 126 U/L Final   Total  Bilirubin 06/07/2022 0.4  0.3 - 1.2 mg/dL Final   GFR, Estimated 06/07/2022 >60  >60 mL/min Final   Comment: (NOTE) Calculated using the CKD-EPI Creatinine Equation (2021)    Anion gap 06/07/2022 7  5 - 15 Final   Performed at [facility], [address], [place], [place] [address]   Hgb A1c MFr Bld 06/07/2022 5.0  4.8 - 5.6 % Final   Comment: (NOTE) Pre diabetes:          5.7%-6.4%  Diabetes:              >6.4%  Glycemic control  for   <7.0% adults with diabetes    Mean Plasma Glucose 06/07/2022 96.8  mg/dL Final   Performed at [facility], [address] [place], [place] [address]   TSH 06/07/2022 1.620  0.350 - 4.500 uIU/mL Final   Comment: Performed by a 3rd Generation assay with a functional sensitivity of <=0.01 uIU/mL. Performed at [facility], [address], [place], [place] [address]    RPR Ser Ql 06/07/2022 NON REACTIVE  NON REACTIVE Final   Performed at [facility], [address] [place], [place] [address]   Color, Urine 06/07/2022 YELLOW  YELLOW Final   APPearance 06/07/2022 HAZY (A)  CLEAR Final   Specific Gravity, Urine 06/07/2022 1.018  1.005 - 1.030 Final   pH 06/07/2022 7.0  5.0 - 8.0 Final   Glucose, UA 06/07/2022 NEGATIVE  NEGATIVE mg/dL Final   Hgb urine dipstick 06/07/2022 NEGATIVE  NEGATIVE Final   Bilirubin Urine 06/07/2022 NEGATIVE  NEGATIVE Final   Ketones, ur 06/07/2022 NEGATIVE  NEGATIVE mg/dL Final   Protein, ur 29/56/2130 NEGATIVE  NEGATIVE mg/dL Final   Nitrite 86/57/8469 NEGATIVE  NEGATIVE Final   Leukocytes,Ua 06/07/2022 NEGATIVE  NEGATIVE Final   Performed at [facility], [address], [place], [place] [address]   Cholesterol 06/07/2022 164  0 - 200 mg/dL Final   Triglycerides 84/13/2440 158 (H)  <150 mg/dL Final   HDL 08/10/2535 44  >40 mg/dL Final   Total CHOL/HDL Ratio 06/07/2022 3.7  RATIO Final   VLDL 06/07/2022 32  0 - 40 mg/dL Final   LDL Cholesterol 06/07/2022 88  0 - 99 mg/dL Final   Comment:        Total Cholesterol/HDL:CHD Risk Coronary Heart Disease Risk Table                     Men   Women  1/2 Average Risk   3.4   3.3  Average Risk       5.0   4.4  2 X Average Risk   9.6   7.1  3 X Average Risk  23.4   11.0        Use the calculated Patient Ratio above and the CHD Risk Table to determine the patient's CHD Risk.        ATP III CLASSIFICATION (LDL):  <100     mg/dL   Optimal  644-034  mg/dL   Near or Above                     Optimal  130-159  mg/dL   Borderline  742-595  mg/dL   High  >638     mg/dL   Very High Performed at [facility], [address], [place], [place] [address]    HIV Screen 4th Generation wRfx 06/07/2022 Non  Reactive  Non Reactive Final   Performed at [facility], [address] [place], [place] [address]   SARSCOV2ONAVIRUS 2 AG 06/07/2022 NEGATIVE  NEGATIVE Final   Comment: (NOTE) SARS-CoV-2 antigen NOT DETECTED.   Negative results are presumptive.  Negative results do not preclude SARS-CoV-2 infection and should not be used as the sole basis for treatment or other patient management decisions, including infection  control decisions, particularly in the presence of clinical signs and  symptoms consistent with COVID-19, or in those who have been in contact with the virus.  Negative results must be combined with clinical observations, patient history, and epidemiological information. The expected result is Negative.  Fact Sheet for Patients: https://www.jennings-kim.com/  Fact Sheet for Healthcare Providers: https://alexander-rogers.biz/  This test is not yet approved or cleared by the [place] FDA and  has been authorized for detection and/or diagnosis of SARS-CoV-2 by FDA under an Emergency Use Authorization (EUA).  This EUA will remain in effect (meaning this test can be used) for the duration of  the COV                          ID-19 declaration under Section 564(b)(1) of the Act, 21 U.S.C. section 360bbb-3(b)(1), unless the authorization is terminated or revoked sooner.     POC Amphetamine UR 06/07/2022 None Detected  NONE DETECTED (Cut Off Level 1000 ng/mL) Final   POC Secobarbital (BAR) 06/07/2022 None Detected  NONE DETECTED (Cut Off Level 300 ng/mL) Final   POC Buprenorphine (BUP) 06/07/2022 None Detected  NONE DETECTED (Cut Off Level 10 ng/mL) Final   POC Oxazepam (BZO) 06/07/2022 None Detected  NONE DETECTED (Cut Off  Level 300 ng/mL) Final   POC Cocaine UR 06/07/2022 None Detected  NONE DETECTED (Cut Off Level 300 ng/mL) Final   POC Methamphetamine UR 06/07/2022 None Detected  NONE DETECTED (Cut Off Level 1000 ng/mL) Final   POC Morphine 06/07/2022 None Detected  NONE DETECTED (Cut Off Level 300 ng/mL) Final   POC Methadone UR 06/07/2022 None Detected  NONE DETECTED (Cut Off Level 300 ng/mL) Final   POC Oxycodone UR 06/07/2022 None Detected  NONE DETECTED (Cut Off Level 100 ng/mL) Final   POC Marijuana UR 06/07/2022 None Detected  NONE DETECTED (Cut Off Level 50 ng/mL) Final   Preg Test, Ur 06/08/2022 NEGATIVE  NEGATIVE Final   Comment:        THE SENSITIVITY OF THIS METHODOLOGY IS >20 mIU/mL. Performed at [facility], [address], [place], [place] [address]    Preg Test, Ur 06/08/2022 NEGATIVE  NEGATIVE Final   Comment:        THE SENSITIVITY OF THIS METHODOLOGY IS >24 mIU/mL     Blood Alcohol level:  Lab Results  Component Value Date   ETH <10 07/04/2022   ETH <10 02/07/2022    Metabolic Disorder Labs: Lab Results  Component Value Date   HGBA1C 5.0 06/07/2022   MPG 96.8 06/07/2022   MPG 96.8 02/07/2022   No results found for: "PROLACTIN" Lab Results  Component Value Date   CHOL 181 07/25/2022   TRIG 118 07/25/2022   HDL 45 07/25/2022   CHOLHDL 4.0 07/25/2022   VLDL 24 07/25/2022   LDLCALC 112 (H) 07/25/2022   LDLCALC 88 06/07/2022    Therapeutic Lab Levels: No results found for: "LITHIUM" No results found for: "VALPROATE" No results found for: "CBMZ"  Physical Findings   GAD-7    Flowsheet  Row [facility]17/20[facility]  Total GAD-7 Score 17      PHQ2-9    Flowsheet Row ED from 06/07/2022 in [facility] Office Visit from 12/17/2021 in [facility]  PHQ-2 Total Score 1 2  PHQ-9 Total Score 2 8      Flowsheet Row ED from 07/25/2022 in [facility] ED from 07/04/2022 in [facility] ED from 06/14/2022 in [facility]   C-SSRS RISK CATEGORY High Risk No Risk No Risk        Musculoskeletal  Strength & Muscle Tone: within normal limits Gait & Station: normal Patient leans: N/A  Psychiatric Specialty Exam  Presentation  General Appearance:  Appropriate for Environment  Eye Contact: Good  Speech: Clear and Coherent; Normal Rate  Speech Volume: Normal  Handedness: Right   Mood and Affect  Mood: Euthymic  Affect: Appropriate; Congruent   Thought Process  Thought Processes: Coherent  Descriptions of Associations:Intact  Orientation:Full (Time, Place and Person)  Thought Content:Logical  Diagnosis of Schizophrenia or Schizoaffective disorder in past: No    Hallucinations:Hallucinations: None  Ideas of Reference:None  Suicidal Thoughts:Suicidal Thoughts: No  Homicidal Thoughts:Homicidal Thoughts: No   Sensorium  Memory: Immediate Good  Judgment: Fair  Insight: Fair   Art therapist  Concentration: Good  Attention Span: Good  Recall: Good  Fund of Knowledge: Good  Language: Good   Psychomotor Activity  Psychomotor Activity: Psychomotor Activity: Normal   Assets  Assets: Communication Skills; Desire for Improvement   Sleep  Sleep: Sleep: Good   No data recorded  Physical Exam  Physical Exam Constitutional:      General: She is not in acute distress.    Appearance: She is not ill-appearing, toxic-appearing or diaphoretic.  Eyes:     General: No scleral icterus. Cardiovascular:     Rate and Rhythm: Normal rate.  Pulmonary:     Effort: Pulmonary effort is normal. No respiratory distress.  Neurological:     Mental Status: She is alert and oriented to person, place, and time.  Psychiatric:        Attention and Perception: Attention and perception normal.        Mood and  Affect: Mood and affect normal.        Speech: Speech normal.        Behavior: Behavior normal. Behavior is cooperative.        Thought Content: Thought content normal.    Review of Systems  Constitutional:  Negative for chills and fever.  Respiratory:  Negative for shortness of breath.   Cardiovascular:  Negative for chest pain and palpitations.  Gastrointestinal:  Negative for abdominal pain.  Neurological:  Negative for headaches.  Psychiatric/Behavioral: Negative.     Blood pressure 101/78, pulse 100, temperature 97.9 F (36.6 C), temperature source Oral, resp. rate 14, SpO2 99 %. There is no height or weight on file to calculate BMI.  Treatment Plan Summary: Plan Pt is psychiatrically cleared. Dispo is pending placement.  Lauree Chandler, NP 08/16/2022 10:43 AM

## 2022-08-16 NOTE — ED Notes (Signed)
Pt A&O x 4, no distress noted, calm & cooperative.  Monitoring for safety. 

## 2022-08-16 NOTE — ED Notes (Signed)
Pt sleeping at present, no distress noted.  Monitoring for safety. 

## 2022-08-17 NOTE — ED Notes (Signed)
Pt Nicole [name], resting quietly while watching tv.  Breathing is even and unlabored.  She denies SI, HI, AVH, and pain.  Will continue to monitor for safety.

## 2022-08-17 NOTE — ED Provider Notes (Signed)
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  Patient was recently discharged from her group home.  She has been medication compliant while on the unit.  Patient is awaiting psychological testing to complete the referral process to [facility].  She continues to deny suicidal or homicidal ideations.  Denies auditory visual hallucinations.  Patient observed interacting with peers she presents with a bright and pleasant affect.   Nicole [name] is sitting in no acute distress. She is alert/oriented x 3; calm/cooperative; and mood congruent with affect. She is speaking in a clear tone at moderate volume, and normal pace; with good eye contact.  He her thought process is coherent and relevant; There is no indication that she is currently responding to internal/external stimuli or experiencing delusional thought content; and she has denied suicidal/self-harm/homicidal ideation, psychosis, and paranoia.   Patient has remained calm throughout assessment and has answered questions appropriately.      Diagnosis:  Final diagnoses:  Severe episode of recurrent major depressive disorder, with psychotic features (HCC)    Total Time spent with patient: 15 minutes  Past Psychiatric History:  Past Medical History:  Past Medical History:  Diagnosis Date   Anxiety    Depression    Hypothyroidism 08/07/2022   Tobacco use disorder 08/07/2022    Past Surgical History:  Procedure Laterality Date   WISDOM TOOTH EXTRACTION Bilateral 2020   Family History:  Family History  Problem Relation Age of Onset   Hypertension Father    Diabetes Father    Family Psychiatric  History:  Social History:  Social History   Substance and Sexual Activity  Alcohol Use Never     Social History    Substance and Sexual Activity  Drug Use Never    Social History   Socioeconomic History   Marital status: Single    Spouse name: Not on file   Number of children: Not on file   Years of education: Not on file   Highest education level: Not on file  Occupational History   Not on file  Tobacco Use   Smoking status: Every Day    Types: Cigarettes   Smokeless tobacco: Never  Vaping Use   Vaping Use: Some days  Substance and Sexual Activity   Alcohol use: Never   Drug use: Never   Sexual activity: Yes    Partners: Female    Birth control/protection: Implant  Other Topics Concern   Not on file  Social History Narrative   Not on file   Social Determinants of Health   Financial Resource Strain: Not on file  Food Insecurity: Not on file  Transportation Needs: Not on file  Physical Activity: Not on file  Stress: Not on file  Social Connections: Not on file   SDOH:  SDOH Screenings   Depression (PHQ2-9): Low Risk  (06/10/2022)  Tobacco Use: High Risk (08/07/2022)   Additional Social History:    Pain Medications: SEE MAR Prescriptions: SEE MAR Over the Counter: SEE MAR History of alcohol / drug use?: Yes Longest period of sobriety (when/how long): N/A Negative Consequences of Use:  (NONE) Withdrawal Symptoms: None Name of Substance 1: ETOH IN PAST--PT CURRENTLY DENIES 1 - Frequency: SOCIAL USE ETOH IN PAST 1 - Method of Aquiring: LEGAL 1- Route of Use: ORAL DRINK Name of Substance  2: NICOTINE-VAPE AND CIGARETTES 2 - Amount (size/oz): LESS THAN ONE PACK 2 - Frequency: DAILY 2 - Method of Aquiring: LEGAL 2 - Route of Substance Use: ORAL SMOKE                Sleep: Good  Appetite:  Fair  Current Medications:  Current Facility-Administered Medications  Medication Dose Route Frequency Provider Last Rate Last Admin   acetaminophen (TYLENOL) tablet 650 mg  650 mg Oral Q6H PRN Onuoha, Chinwendu V, NP   650 mg at 08/15/22 1820   alum & mag hydroxide-simeth  (MAALOX/MYLANTA) 200-200-20 MG/5ML suspension 30 mL  30 mL Oral Q4H PRN Onuoha, Chinwendu V, NP   30 mL at 07/27/22 2151   ARIPiprazole ER (ABILIFY MAINTENA) injection 400 mg  400 mg Intramuscular Q28 days Princess Bruins, DO   400 mg at 08/02/22 1154   clotrimazole (LOTRIMIN) 1 % cream   Topical BID Bobbitt, Shalon E, NP   Given at 08/16/22 2136   hydrOXYzine (ATARAX) tablet 25 mg  25 mg Oral TID PRN Onuoha, Chinwendu V, NP   25 mg at 08/16/22 2138   ibuprofen (ADVIL) tablet 400 mg  400 mg Oral Q6H PRN Princess Bruins, DO   400 mg at 08/08/22 0854   levothyroxine (SYNTHROID) tablet 100 mcg  100 mcg Oral Q0600 Princess Bruins, DO   100 mcg at 08/17/22 0615   magnesium hydroxide (MILK OF MAGNESIA) suspension 30 mL  30 mL Oral Daily PRN Onuoha, Chinwendu V, NP   30 mL at 08/02/22 1956   nicotine (NICODERM CQ - dosed in mg/24 hours) patch 14 mg  14 mg Transdermal Q0600 Onuoha, Chinwendu V, NP   14 mg at 08/17/22 1006   ondansetron (ZOFRAN) tablet 4 mg  4 mg Oral Once Nwoko, Uchenna E, PA       Oxcarbazepine (TRILEPTAL) tablet 300 mg  300 mg Oral BID Onuoha, Chinwendu V, NP   300 mg at 08/16/22 2137   pantoprazole (PROTONIX) EC tablet 20 mg  20 mg Oral Daily Princess Bruins, DO   20 mg at 08/17/22 1006   QUEtiapine (SEROQUEL) tablet 400 mg  400 mg Oral BID Onuoha, Chinwendu V, NP   400 mg at 08/17/22 1006   senna-docusate (Senokot-S) tablet 1 tablet  1 tablet Oral QHS Princess Bruins, DO   1 tablet at 08/16/22 2137   sertraline (ZOLOFT) tablet 150 mg  150 mg Oral Daily Onuoha, Chinwendu V, NP   150 mg at 08/17/22 1006   traZODone (DESYREL) tablet 50 mg  50 mg Oral QHS PRN Onuoha, Chinwendu V, NP   50 mg at 08/16/22 2137   valACYclovir (VALTREX) tablet 500 mg  500 mg Oral Daily Princess Bruins, DO   500 mg at 08/17/22 1006   Current Outpatient Medications  Medication Sig Dispense Refill   ABILIFY MAINTENA 400 MG PRSY prefilled syringe 400 mg every 28 (twenty-eight) days.     cetirizine (ZYRTEC) 10 MG tablet Take  10 mg by mouth daily.     cyclobenzaprine (FLEXERIL) 10 MG tablet Take 1 tablet (10 mg total) by mouth 2 (two) times daily as needed for muscle spasms. 20 tablet 0   fluticasone (FLONASE) 50 MCG/ACT nasal spray Place 1 spray into both nostrils daily.     hydrOXYzine (ATARAX) 50 MG tablet Take 1 tablet (50 mg total) by mouth 2 (two) times daily. 60 tablet 0   meloxicam (MOBIC) 15 MG tablet Take 15 mg by mouth daily.     Oxcarbazepine (  TRILEPTAL) 300 MG tablet Take 1 tablet (300 mg total) by mouth 2 (two) times daily. 60 tablet 0   QUEtiapine (SEROQUEL) 400 MG tablet Take 1 tablet (400 mg total) by mouth 2 (two) times daily. 60 tablet 0   sertraline (ZOLOFT) 50 MG tablet Take 3 tablets (150 mg total) by mouth in the morning. 90 tablet 0   traZODone (DESYREL) 100 MG tablet Take 1 tablet (100 mg total) by mouth at bedtime. 30 tablet 0   valACYclovir (VALTREX) 500 MG tablet Take 500 mg by mouth daily.      Labs  Lab Results:  Admission on 07/25/2022  Component Date Value Ref Range Status   SARS Coronavirus 2 by RT PCR 07/25/2022 NEGATIVE  NEGATIVE Final   Comment: (NOTE) SARS-CoV-2 target nucleic acids are NOT DETECTED.  The SARS-CoV-2 RNA is generally detectable in upper respiratory specimens during the acute phase of infection. The lowest concentration of SARS-CoV-2 viral copies this assay can detect is 138 copies/mL. A negative result does not preclude SARS-Cov-2 infection and should not be used as the sole basis for treatment or other patient management decisions. A negative result may occur with  improper specimen collection/handling, submission of specimen other than nasopharyngeal swab, presence of viral mutation(s) within the areas targeted by this assay, and inadequate number of viral copies(<138 copies/mL). A negative result must be combined with clinical observations, patient history, and epidemiological information. The expected result is Negative.  Fact Sheet for Patients:   BloggerCourse.com  Fact Sheet for Healthcare Providers:  SeriousBroker.it  This test is no                          t yet approved or cleared by the [place] FDA and  has been authorized for detection and/or diagnosis of SARS-CoV-2 by FDA under an Emergency Use Authorization (EUA). This EUA will remain  in effect (meaning this test can be used) for the duration of the COVID-19 declaration under Section 564(b)(1) of the Act, 21 U.S.C.section 360bbb-3(b)(1), unless the authorization is terminated  or revoked sooner.       Influenza A by PCR 07/25/2022 NEGATIVE  NEGATIVE Final   Influenza B by PCR 07/25/2022 NEGATIVE  NEGATIVE Final   Comment: (NOTE) The Xpert Xpress SARS-CoV-2/FLU/RSV plus assay is intended as an aid in the diagnosis of influenza from Nasopharyngeal swab specimens and should not be used as a sole basis for treatment. Nasal washings and aspirates are unacceptable for Xpert Xpress SARS-CoV-2/FLU/RSV testing.  Fact Sheet for Patients: BloggerCourse.com  Fact Sheet for Healthcare Providers: SeriousBroker.it  This test is not yet approved or cleared by the [place] FDA and has been authorized for detection and/or diagnosis of SARS-CoV-2 by FDA under an Emergency Use Authorization (EUA). This EUA will remain in effect (meaning this test can be used) for the duration of the COVID-19 declaration under Section 564(b)(1) of the Act, 21 U.S.C. section 360bbb-3(b)(1), unless the authorization is terminated or revoked.  Performed at [facility], [address], [place], [place] [address]    WBC 07/25/2022 8.3  4.0 - 10.5 K/uL Final   RBC 07/25/2022 4.44  3.87 - 5.11 MIL/uL Final   Hemoglobin 07/25/2022 13.7  12.0 - 15.0 g/dL Final   HCT 60/45/4098 40.2  36.0 - 46.0 % Final   MCV 07/25/2022 90.5  80.0 - 100.0 fL Final   MCH 07/25/2022 30.9  26.0 - 34.0  pg Final   MCHC 07/25/2022  34.1  30.0 - 36.0 g/dL Final   RDW 07/25/2022 12.2  11.5 - 15.5 % Final   Platelets 07/25/2022 248  150 - 400 K/uL Final   nRBC 07/25/2022 0.0  0.0 - 0.2 % Final   Neutrophils Relative % 07/25/2022 43  % Final   Neutro Abs 07/25/2022 3.6  1.7 - 7.7 K/uL Final   Lymphocytes Relative 07/25/2022 52  % Final   Lymphs Abs 07/25/2022 4.2 (H)  0.7 - 4.0 K/uL Final   Monocytes Relative 07/25/2022 5  % Final   Monocytes Absolute 07/25/2022 0.4  0.1 - 1.0 K/uL Final   Eosinophils Relative 07/25/2022 0  % Final   Eosinophils Absolute 07/25/2022 0.0  0.0 - 0.5 K/uL Final   Basophils Relative 07/25/2022 0  % Final   Basophils Absolute 07/25/2022 0.0  0.0 - 0.1 K/uL Final   Immature Granulocytes 07/25/2022 0  % Final   Abs Immature Granulocytes 07/25/2022 0.02  0.00 - 0.07 K/uL Final   Performed at [facility], [place] [address], [place], [place] [address]   Sodium 07/25/2022 138  135 - 145 mmol/L Final   Potassium 07/25/2022 4.0  3.5 - 5.1 mmol/L Final   Chloride 07/25/2022 104  98 - 111 mmol/L Final   CO2 07/25/2022 29  22 - 32 mmol/L Final   Glucose, Bld 07/25/2022 83  70 - 99 mg/dL Final   Glucose reference range applies only to samples taken after fasting for at least 8 hours.   BUN 07/25/2022 11  6 - 20 mg/dL Final   Creatinine, Ser 07/25/2022 0.97  0.44 - 1.00 mg/dL Final   Calcium 07/25/2022 9.2  8.9 - 10.3 mg/dL Final   Total Protein 07/25/2022 7.0  6.5 - 8.1 g/dL Final   Albumin 07/25/2022 3.8  3.5 - 5.0 g/dL Final   AST 07/25/2022 18  15 - 41 U/L Final   ALT 07/25/2022 22  0 - 44 U/L Final   Alkaline Phosphatase 07/25/2022 64  38 - 126 U/L Final   Total Bilirubin 07/25/2022 0.2 (L)  0.3 - 1.2 mg/dL Final   GFR, Estimated 07/25/2022 >60  >60 mL/min Final   Comment: (NOTE) Calculated using the CKD-EPI Creatinine Equation (2021)    Anion gap 07/25/2022 5  5 - 15 Final   Performed at [facility] [address] [place], [place] [address]   POC  Amphetamine UR 07/25/2022 None Detected  NONE DETECTED (Cut Off Level 1000 ng/mL) Preliminary   POC Secobarbital (BAR) 07/25/2022 None Detected  NONE DETECTED (Cut Off Level 300 ng/mL) Preliminary   POC Buprenorphine (BUP) 07/25/2022 None Detected  NONE DETECTED (Cut Off Level 10 ng/mL) Preliminary   POC Oxazepam (BZO) 07/25/2022 None Detected  NONE DETECTED (Cut Off Level 300 ng/mL) Preliminary   POC Cocaine UR 07/25/2022 None Detected  NONE DETECTED (Cut Off Level 300 ng/mL) Preliminary   POC Methamphetamine UR 07/25/2022 None Detected  NONE DETECTED (Cut Off Level 1000 ng/mL) Preliminary   POC Morphine 07/25/2022 None Detected  NONE DETECTED (Cut Off Level 300 ng/mL) Preliminary   POC Methadone UR 07/25/2022 None Detected  NONE DETECTED (Cut Off Level 300 ng/mL) Preliminary   POC Oxycodone UR 07/25/2022 None Detected  NONE DETECTED (Cut Off Level 100 ng/mL) Preliminary   POC Marijuana UR 07/25/2022 None Detected  NONE DETECTED (Cut Off Level 50 ng/mL) Preliminary   SARSCOV2ONAVIRUS 2 AG 07/25/2022 NEGATIVE  NEGATIVE Final   Comment: (NOTE) SARS-CoV-2 antigen NOT DETECTED.   Negative  results are presumptive.  Negative results do not preclude SARS-CoV-2 infection and should not be used as the sole basis for treatment or other patient management decisions, including infection  control decisions, particularly in the presence of clinical signs and  symptoms consistent with COVID-19, or in those who have been in contact with the virus.  Negative results must be combined with clinical observations, patient history, and epidemiological information. The expected result is Negative.  Fact Sheet for Patients: https://www.jennings-kim.com/  Fact Sheet for Healthcare Providers: https://alexander-rogers.biz/  This test is not yet approved or cleared by the [place] FDA and  has been authorized for detection and/or diagnosis of SARS-CoV-2 by FDA under an Emergency Use  Authorization (EUA).  This EUA will remain in effect (meaning this test can be used) for the duration of  the COV                          ID-19 declaration under Section 564(b)(1) of the Act, 21 U.S.C. section 360bbb-3(b)(1), unless the authorization is terminated or revoked sooner.     Cholesterol 07/25/2022 181  0 - 200 mg/dL Final   Triglycerides 16/07/9603 118  <150 mg/dL Final   HDL 54/06/8118 45  >40 mg/dL Final   Total CHOL/HDL Ratio 07/25/2022 4.0  RATIO Final   VLDL 07/25/2022 24  0 - 40 mg/dL Final   LDL Cholesterol 07/25/2022 112 (H)  0 - 99 mg/dL Final   Comment:        Total Cholesterol/HDL:CHD Risk Coronary Heart Disease Risk Table                     Men   Women  1/2 Average Risk   3.4   3.3  Average Risk       5.0   4.4  2 X Average Risk   9.6   7.1  3 X Average Risk  23.4   11.0        Use the calculated Patient Ratio above and the CHD Risk Table to determine the patient's CHD Risk.        ATP III CLASSIFICATION (LDL):  <100     mg/dL   Optimal  147-829  mg/dL   Near or Above                    Optimal  130-159  mg/dL   Borderline  562-130  mg/dL   High  >865     mg/dL   Very High Performed at [facility], [address], [place], [place] [address]    TSH 07/25/2022 6.668 (H)  0.350 - 4.500 uIU/mL Final   Comment: Performed by a 3rd Generation assay with a functional sensitivity of <=0.01 uIU/mL. Performed at [facility], [address] [place], [place] [address]    Glucose-Capillary 07/26/2022 104 (H)  70 - 99 mg/dL Final   Glucose reference range applies only to samples taken after fasting for at least 8 hours.   T3, Free 07/31/2022 2.3  2.0 - 4.4 pg/mL Final   Comment: (NOTE) Performed At: [facility] [address] [place], [place] [address] Jolene Schimke MD UU:[id number]    Free T4 07/31/2022 0.60 (L)  0.61 - 1.12 ng/dL Final   Comment: (NOTE) Biotin ingestion may interfere with free T4 tests. If the results  are inconsistent with the TSH level, previous test results, or the clinical presentation, then consider biotin interference.  If needed, order repeat testing after stopping biotin. Performed at [facility], [address] [place], [place] [address]   Admission on 07/04/2022, Discharged on 07/05/2022  Component Date Value Ref Range Status   Sodium 07/04/2022 142  135 - 145 mmol/L Final   Potassium 07/04/2022 4.4  3.5 - 5.1 mmol/L Final   Chloride 07/04/2022 109  98 - 111 mmol/L Final   CO2 07/04/2022 26  22 - 32 mmol/L Final   Glucose, Bld 07/04/2022 96  70 - 99 mg/dL Final   Glucose reference range applies only to samples taken after fasting for at least 8 hours.   BUN 07/04/2022 15  6 - 20 mg/dL Final   Creatinine, Ser 07/04/2022 0.83  0.44 - 1.00 mg/dL Final   Calcium 60/45/409809/21/2023 9.3  8.9 - 10.3 mg/dL Final   Total Protein 11/91/478209/21/2023 7.2  6.5 - 8.1 g/dL Final   Albumin 95/62/130809/21/2023 3.9  3.5 - 5.0 g/dL Final   AST 65/78/469609/21/2023 23  15 - 41 U/L Final   ALT 07/04/2022 28  0 - 44 U/L Final   Alkaline Phosphatase 07/04/2022 77  38 - 126 U/L Final   Total Bilirubin 07/04/2022 0.4  0.3 - 1.2 mg/dL Final   GFR, Estimated 07/04/2022 >60  >60 mL/min Final   Comment: (NOTE) Calculated using the CKD-EPI Creatinine Equation (2021)    Anion gap 07/04/2022 7  5 - 15 Final   Performed at [facility], [address] [place], [place] [address]   Alcohol, Ethyl (B) 07/04/2022 <10  <10 mg/dL Final   Comment: (NOTE) Lowest detectable limit for serum alcohol is 10 mg/dL.  For medical purposes only. Performed at [facility], [address] [place], [place] [address]    WBC 07/04/2022 7.0  4.0 - 10.5 K/uL Final   RBC 07/04/2022 4.18  3.87 - 5.11 MIL/uL Final   Hemoglobin 07/04/2022 12.8  12.0 - 15.0 g/dL Final   HCT 40/10/272509/21/2023 39.1  36.0 - 46.0 % Final   MCV 07/04/2022 93.5  80.0 - 100.0 fL Final   MCH 07/04/2022 30.6  26.0 - 34.0 pg Final   MCHC  07/04/2022 32.7  30.0 - 36.0 g/dL Final   RDW 36/64/403409/21/2023 12.9  11.5 - 15.5 % Final   Platelets 07/04/2022 243  150 - 400 K/uL Final   nRBC 07/04/2022 0.0  0.0 - 0.2 % Final   Neutrophils Relative % 07/04/2022 43  % Final   Neutro Abs 07/04/2022 3.0  1.7 - 7.7 K/uL Final   Lymphocytes Relative 07/04/2022 50  % Final   Lymphs Abs 07/04/2022 3.5  0.7 - 4.0 K/uL Final   Monocytes Relative 07/04/2022 7  % Final   Monocytes Absolute 07/04/2022 0.5  0.1 - 1.0 K/uL Final   Eosinophils Relative 07/04/2022 0  % Final   Eosinophils Absolute 07/04/2022 0.0  0.0 - 0.5 K/uL Final   Basophils Relative 07/04/2022 0  % Final   Basophils Absolute 07/04/2022 0.0  0.0 - 0.1 K/uL Final   Immature Granulocytes 07/04/2022 0  % Final   Abs Immature Granulocytes 07/04/2022 0.01  0.00 - 0.07 K/uL Final   Performed at [facility], [address] [place], [place] [address]   I-stat hCG, quantitative 07/04/2022 <5.0  <5 mIU/mL Final   Comment 3 07/04/2022          Final   Comment:   GEST. AGE      CONC.  (mIU/mL)   <=  1 WEEK        5 - 50     2 WEEKS       50 - 500     3 WEEKS       100 - 10,000     4 WEEKS     1,000 - 30,000        FEMALE AND NON-PREGNANT FEMALE:     LESS THAN 5 mIU/mL   Admission on 06/14/2022, Discharged on 06/14/2022  Component Date Value Ref Range Status   Color, UA 06/14/2022 yellow  yellow Final   Clarity, UA 06/14/2022 cloudy (A)  clear Final   Glucose, UA 06/14/2022 negative  negative mg/dL Final   Bilirubin, UA 84/53/6468 negative  negative Final   Ketones, POC UA 06/14/2022 negative  negative mg/dL Final   Spec Grav, UA 01/02/2247 1.025  1.010 - 1.025 Final   Blood, UA 06/14/2022 negative  negative Final   pH, UA 06/14/2022 7.5  5.0 - 8.0 Final   Protein Ur, POC 06/14/2022 =30 (A)  negative mg/dL Final   Urobilinogen, UA 06/14/2022 0.2  0.2 or 1.0 E.U./dL Final   Nitrite, UA 25/00/3704 Negative  Negative Final   Leukocytes, UA 06/14/2022 Negative  Negative  Final   Preg Test, Ur 06/14/2022 Negative  Negative Final   Specimen Description 06/14/2022 URINE, CLEAN CATCH   Final   Special Requests 06/14/2022    Final                   Value:NONE Performed at [facility], [address] [place], [place] [address]    Culture 06/14/2022 MULTIPLE SPECIES PRESENT, SUGGEST RECOLLECTION (A)   Final   Report Status 06/14/2022 06/16/2022 FINAL   Final  Admission on 06/07/2022, Discharged on 06/10/2022  Component Date Value Ref Range Status   SARS Coronavirus 2 by RT PCR 06/07/2022 NEGATIVE  NEGATIVE Final   Comment: (NOTE) SARS-CoV-2 target nucleic acids are NOT DETECTED.  The SARS-CoV-2 RNA is generally detectable in upper respiratory specimens during the acute phase of infection. The lowest concentration of SARS-CoV-2 viral copies this assay can detect is 138 copies/mL. A negative result does not preclude SARS-Cov-2 infection and should not be used as the sole basis for treatment or other patient management decisions. A negative result may occur with  improper specimen collection/handling, submission of specimen other than nasopharyngeal swab, presence of viral mutation(s) within the areas targeted by this assay, and inadequate number of viral copies(<138 copies/mL). A negative result must be combined with clinical observations, patient history, and epidemiological information. The expected result is Negative.  Fact Sheet for Patients:  BloggerCourse.com  Fact Sheet for Healthcare Providers:  SeriousBroker.it  This test is no                          t yet approved or cleared by the [place] FDA and  has been authorized for detection and/or diagnosis of SARS-CoV-2 by FDA under an Emergency Use Authorization (EUA). This EUA will remain  in effect (meaning this test can be used) for the duration of the COVID-19 declaration under Section 564(b)(1) of the Act, 21 U.S.C.section  360bbb-3(b)(1), unless the authorization is terminated  or revoked sooner.       Influenza A by PCR 06/07/2022 NEGATIVE  NEGATIVE Final   Influenza B by PCR 06/07/2022 NEGATIVE  NEGATIVE Final   Comment: (NOTE) The Xpert Xpress SARS-CoV-2/FLU/RSV plus assay is intended as an aid  in the diagnosis of influenza from Nasopharyngeal swab specimens and should not be used as a sole basis for treatment. Nasal washings and aspirates are unacceptable for Xpert Xpress SARS-CoV-2/FLU/RSV testing.  Fact Sheet for Patients: BloggerCourse.com  Fact Sheet for Healthcare Providers: SeriousBroker.it  This test is not yet approved or cleared by the [place] FDA and has been authorized for detection and/or diagnosis of SARS-CoV-2 by FDA under an Emergency Use Authorization (EUA). This EUA will remain in effect (meaning this test can be used) for the duration of the COVID-19 declaration under Section 564(b)(1) of the Act, 21 U.S.C. section 360bbb-3(b)(1), unless the authorization is terminated or revoked.  Performed at [facility], [address], [place], [place] [address]    WBC 06/07/2022 5.7  4.0 - 10.5 K/uL Final   RBC 06/07/2022 4.39  3.87 - 5.11 MIL/uL Final   Hemoglobin 06/07/2022 13.2  12.0 - 15.0 g/dL Final   HCT 84/13/2440 40.4  36.0 - 46.0 % Final   MCV 06/07/2022 92.0  80.0 - 100.0 fL Final   MCH 06/07/2022 30.1  26.0 - 34.0 pg Final   MCHC 06/07/2022 32.7  30.0 - 36.0 g/dL Final   RDW 08/10/2535 12.4  11.5 - 15.5 % Final   Platelets 06/07/2022 308  150 - 400 K/uL Final   nRBC 06/07/2022 0.0  0.0 - 0.2 % Final   Neutrophils Relative % 06/07/2022 42  % Final   Neutro Abs 06/07/2022 2.4  1.7 - 7.7 K/uL Final   Lymphocytes Relative 06/07/2022 54  % Final   Lymphs Abs 06/07/2022 3.1  0.7 - 4.0 K/uL Final   Monocytes Relative 06/07/2022 4  % Final   Monocytes Absolute 06/07/2022 0.2  0.1 - 1.0 K/uL Final   Eosinophils  Relative 06/07/2022 0  % Final   Eosinophils Absolute 06/07/2022 0.0  0.0 - 0.5 K/uL Final   Basophils Relative 06/07/2022 0  % Final   Basophils Absolute 06/07/2022 0.0  0.0 - 0.1 K/uL Final   Immature Granulocytes 06/07/2022 0  % Final   Abs Immature Granulocytes 06/07/2022 0.01  0.00 - 0.07 K/uL Final   Performed at [facility], [address], [place], [place] [address]   Sodium 06/07/2022 139  135 - 145 mmol/L Final   Potassium 06/07/2022 4.0  3.5 - 5.1 mmol/L Final   Chloride 06/07/2022 104  98 - 111 mmol/L Final   CO2 06/07/2022 28  22 - 32 mmol/L Final   Glucose, Bld 06/07/2022 104 (H)  70 - 99 mg/dL Final   Glucose reference range applies only to samples taken after fasting for at least 8 hours.   BUN 06/07/2022 8  6 - 20 mg/dL Final   Creatinine, Ser 06/07/2022 0.84  0.44 - 1.00 mg/dL Final   Calcium 47/42/5956 9.1  8.9 - 10.3 mg/dL Final   Total Protein 38/75/6433 6.9  6.5 - 8.1 g/dL Final   Albumin 29/51/8841 3.7  3.5 - 5.0 g/dL Final   AST 66/03/3015 19  15 - 41 U/L Final   ALT 06/07/2022 22  0 - 44 U/L Final   Alkaline Phosphatase 06/07/2022 54  38 - 126 U/L Final   Total Bilirubin 06/07/2022 0.4  0.3 - 1.2 mg/dL Final   GFR, Estimated 06/07/2022 >60  >60 mL/min Final   Comment: (NOTE) Calculated using the CKD-EPI Creatinine Equation (2021)    Anion gap 06/07/2022 7  5 - 15 Final   Performed at [facility], [address] [place], [place] [address]   Hgb A1c MFr Bld 06/07/2022 5.0  4.8 - 5.6 % Final   Comment: (NOTE) Pre diabetes:          5.7%-6.4%  Diabetes:              >6.4%  Glycemic control for   <7.0% adults with diabetes    Mean Plasma Glucose 06/07/2022 96.8  mg/dL Final   Performed at [facility], [address], [place], [place] [address]   TSH 06/07/2022 1.620  0.350 - 4.500 uIU/mL Final   Comment: Performed by a 3rd Generation assay with a functional sensitivity of <=0.01 uIU/mL. Performed at [facility], [address] [place], [place] [address]    RPR Ser Ql 06/07/2022 NON REACTIVE  NON REACTIVE Final   Performed at [facility], [address], [place], [place] [address]   Color, Urine 06/07/2022 YELLOW  YELLOW Final   APPearance 06/07/2022 HAZY (A)  CLEAR Final   Specific Gravity, Urine 06/07/2022 1.018  1.005 - 1.030 Final   pH 06/07/2022 7.0  5.0 - 8.0 Final   Glucose, UA 06/07/2022 NEGATIVE  NEGATIVE mg/dL Final   Hgb urine dipstick 06/07/2022 NEGATIVE  NEGATIVE Final   Bilirubin Urine 06/07/2022 NEGATIVE  NEGATIVE Final   Ketones, ur 06/07/2022 NEGATIVE  NEGATIVE mg/dL Final   Protein, ur 65/78/4696 NEGATIVE  NEGATIVE mg/dL Final   Nitrite 29/52/8413 NEGATIVE  NEGATIVE Final   Leukocytes,Ua 06/07/2022 NEGATIVE  NEGATIVE Final   Performed at [facility], [address] [place], [place] [address]   Cholesterol 06/07/2022 164  0 - 200 mg/dL Final   Triglycerides 02/72/5366 158 (H)  <150 mg/dL Final   HDL 44/12/4740 44  >40 mg/dL Final   Total CHOL/HDL Ratio 06/07/2022 3.7  RATIO Final   VLDL 06/07/2022 32  0 - 40 mg/dL Final   LDL Cholesterol 06/07/2022 88  0 - 99 mg/dL Final   Comment:        Total Cholesterol/HDL:CHD Risk Coronary Heart Disease Risk Table                     Men   Women  1/2 Average Risk   3.4   3.3  Average Risk       5.0   4.4  2 X Average Risk   9.6   7.1  3 X Average Risk  23.4   11.0        Use the calculated Patient Ratio above and the CHD Risk Table to determine the patient's CHD Risk.        ATP III CLASSIFICATION (LDL):  <100     mg/dL   Optimal  595-638  mg/dL   Near or Above                    Optimal  130-159  mg/dL   Borderline  756-433  mg/dL   High  >295     mg/dL   Very High Performed at [facility], [address], [place], [place] [address]    HIV Screen 4th Generation wRfx 06/07/2022 Non Reactive  Non Reactive Final   Performed at [facility], [address] [place], [place] [address]   SARSCOV2ONAVIRUS 2 AG  06/07/2022 NEGATIVE  NEGATIVE Final   Comment: (NOTE) SARS-CoV-2 antigen NOT DETECTED.   Negative results are presumptive.  Negative results do not preclude SARS-CoV-2  infection and should not be used as the sole basis for treatment or other patient management decisions, including infection  control decisions, particularly in the presence of clinical signs and  symptoms consistent with COVID-19, or in those who have been in contact with the virus.  Negative results must be combined with clinical observations, patient history, and epidemiological information. The expected result is Negative.  Fact Sheet for Patients: https://www.jennings-kim.com/  Fact Sheet for Healthcare Providers: https://alexander-rogers.biz/  This test is not yet approved or cleared by the [place] FDA and  has been authorized for detection and/or diagnosis of SARS-CoV-2 by FDA under an Emergency Use Authorization (EUA).  This EUA will remain in effect (meaning this test can be used) for the duration of  the COV                          ID-19 declaration under Section 564(b)(1) of the Act, 21 U.S.C. section 360bbb-3(b)(1), unless the authorization is terminated or revoked sooner.     POC Amphetamine UR 06/07/2022 None Detected  NONE DETECTED (Cut Off Level 1000 ng/mL) Final   POC Secobarbital (BAR) 06/07/2022 None Detected  NONE DETECTED (Cut Off Level 300 ng/mL) Final   POC Buprenorphine (BUP) 06/07/2022 None Detected  NONE DETECTED (Cut Off Level 10 ng/mL) Final   POC Oxazepam (BZO) 06/07/2022 None Detected  NONE DETECTED (Cut Off Level 300 ng/mL) Final   POC Cocaine UR 06/07/2022 None Detected  NONE DETECTED (Cut Off Level 300 ng/mL) Final   POC Methamphetamine UR 06/07/2022 None Detected  NONE DETECTED (Cut Off Level 1000 ng/mL) Final   POC Morphine 06/07/2022 None Detected  NONE DETECTED (Cut Off Level 300 ng/mL) Final   POC Methadone UR 06/07/2022 None Detected  NONE DETECTED  (Cut Off Level 300 ng/mL) Final   POC Oxycodone UR 06/07/2022 None Detected  NONE DETECTED (Cut Off Level 100 ng/mL) Final   POC Marijuana UR 06/07/2022 None Detected  NONE DETECTED (Cut Off Level 50 ng/mL) Final   Preg Test, Ur 06/08/2022 NEGATIVE  NEGATIVE Final   Comment:        THE SENSITIVITY OF THIS METHODOLOGY IS >20 mIU/mL. Performed at [facility], [address] [place], [place] [address]    Preg Test, Ur 06/08/2022 NEGATIVE  NEGATIVE Final   Comment:        THE SENSITIVITY OF THIS METHODOLOGY IS >24 mIU/mL     Blood Alcohol level:  Lab Results  Component Value Date   ETH <10 07/04/2022   ETH <10 02/07/2022    Metabolic Disorder Labs: Lab Results  Component Value Date   HGBA1C 5.0 06/07/2022   MPG 96.8 06/07/2022   MPG 96.8 02/07/2022   No results found for: "PROLACTIN" Lab Results  Component Value Date   CHOL 181 07/25/2022   TRIG 118 07/25/2022   HDL 45 07/25/2022   CHOLHDL 4.0 07/25/2022   VLDL 24 07/25/2022   LDLCALC 112 (H) 07/25/2022   LDLCALC 88 06/07/2022    Therapeutic Lab Levels: No results found for: "LITHIUM" No results found for: "VALPROATE" No results found for: "CBMZ"  Physical Findings   GAD-7    Flowsheet Row Office Visit [facility]/17/2021 in [facility]  Total GAD-7 Score 17      PHQ2-9    Flowsheet Row ED from 06/07/2022 in [facility] Office Visit from 12/17/2021 in [facility]  PHQ-2 Total  Score 1 2  PHQ-9 Total Score 2 8      Flowsheet Row ED from 07/25/2022 in [facility] ED from 07/04/2022 in [facility] from 06/14/2022 in [facility]   C-SSRS RISK CATEGORY High Risk No Risk No Risk        Musculoskeletal  Strength & Muscle Tone: within normal limits Gait & Station: normal Patient leans: N/A  Psychiatric Specialty Exam  Presentation   General Appearance:  Appropriate for Environment  Eye Contact: Good  Speech: Clear and Coherent; Normal Rate  Speech Volume: Normal  Handedness: Right   Mood and Affect  Mood: Euthymic  Affect: Appropriate; Congruent   Thought Process  Thought Processes: Coherent  Descriptions of Associations:Intact  Orientation:Full (Time, Place and Person)  Thought Content:Logical  Diagnosis of Schizophrenia or Schizoaffective disorder in past: No    Hallucinations:Hallucinations: None  Ideas of Reference:None  Suicidal Thoughts:Suicidal Thoughts: No  Homicidal Thoughts:Homicidal Thoughts: No   Sensorium  Memory: Immediate Good  Judgment: Fair  Insight: Fair   Art therapist  Concentration: Good  Attention Span: Good  Recall: Good  Fund of Knowledge: Good  Language: Good   Psychomotor Activity  Psychomotor Activity: Psychomotor Activity: Normal   Assets  Assets: Communication Skills; Desire for Improvement   Sleep  Sleep: Sleep: Good   No data recorded  Physical Exam  Physical Exam Vitals and nursing note reviewed.  Cardiovascular:     Rate and Rhythm: Normal rate and regular rhythm.  Neurological:     Mental Status: She is alert and oriented to person, place, and time.  Psychiatric:        Mood and Affect: Mood normal.        Thought Content: Thought content normal.   Review of Systems  Respiratory: Negative.    Cardiovascular: Negative.   Genitourinary: Negative.   Endo/Heme/Allergies: Negative.   Psychiatric/Behavioral:  Negative for depression and suicidal ideas. The patient is nervous/anxious.   All other systems reviewed and are negative.  Blood pressure 109/85, pulse (!) 110, temperature 98.2 F (36.8 C), temperature source Oral, resp. rate 16, SpO2 100 %. There is no height or weight on file to calculate BMI.  Treatment Plan Summary:  Patient remains psychiatrically cleared -CSW to continue working on  discharge disposition -Patient to continue medications as directed     Oneta Rack, NP 08/17/2022 10:32 AM

## 2022-08-17 NOTE — ED Notes (Signed)
Pt calm, cooperative. Rise and fall of chest noted with audible snoring. No acute distress at this time. Will continue to monitor.

## 2022-08-17 NOTE — ED Notes (Signed)
Patient resting in bed with no acute distress. Respirations even and non-labored. Denies SI/HI/AVH. Pt is safe on unit.

## 2022-08-18 DIAGNOSIS — F333 Major depressive disorder, recurrent, severe with psychotic symptoms: Secondary | ICD-10-CM | POA: Diagnosis not present

## 2022-08-18 NOTE — Progress Notes (Signed)
Patient is calm and pleasant.  Cooperative with care, meds and meals.  She ate lunch, watched tv for a short while and is now resting in bed.  Patient is able to make needs known to staff.  No distress or complaint.  Will monitor and provide a safe environment.

## 2022-08-18 NOTE — ED Notes (Signed)
Pt resting quietly with eyes closed.  No pain or discomfort noted/voiced.  Breathing is even and unlabored.  Will continue to monitor for safety.  

## 2022-08-18 NOTE — ED Provider Notes (Signed)
Cipriano Bunker seen and evaluated for the face by this provider.  Continues to deny suicidal or homicidal ideations.  Denies auditory visual hallucinations.  Reports she is hopeful that to get into another group home.  States " I had to tell DSS the truth about my previous group home."  Reports she is feeling better overall however continues to report some restlessness related to being confined to this area.  Staff to continue to monitor for safety no behavior concerns noted.  Patient has been compliant with medications.  Support, encouragement and reassurance was provided.

## 2022-08-18 NOTE — Progress Notes (Signed)
Patient is calm, pleasant and well related.  Simple in thought.  Makes needs known and in a good mood.  Patient without distress or somatic complaint.  Patient presently focused on bible readings.  Will monitor and provide safe environment.

## 2022-08-19 NOTE — ED Notes (Signed)
Pt sleeping in recliner bed. No noted resp distress. Will monitor for safety

## 2022-08-19 NOTE — ED Notes (Signed)
Pt returned from meeting with provider.

## 2022-08-19 NOTE — ED Notes (Signed)
Pt sleeping at present, no distress noted.  Monitoring for safety. 

## 2022-08-19 NOTE — ED Notes (Signed)
Pt A&O x 4, no distress noted., calm & cooperative, monitoring for safety. 

## 2022-08-19 NOTE — ED Notes (Signed)
Pt sleeping in recliner bed. Will continue to monitor for safety

## 2022-08-19 NOTE — Consult Note (Signed)
Psychological testing consult 1:15 PM to 2:30 PM.  Reason for referral: [name] was referred for IQ testing to aid in discharge placement planning.  Tests administered: Wechsler Adult Intelligence Scale-4  Test results: On the Wechsler Adult Intelligence Scale-4, [name] achieved a full-scale IQ score 72 and a percentile rank of 3 placing her in the borderline range of intellectual functioning.  Her full-scale IQ score confidence interval was 68-77 indicating that her true intellectual ability falls at the upper end of the mild intellectual disability range to the middle of the borderline range of intellectual functioning.  On the verbal comprehension index, she achieved a standard score of 78 and percentile rank of 7 placing her in the borderline range of functioning.  Overall, her fund of general knowledge, lexical knowledge, and abstract reasoning skills are in the borderline range.  On the perceptual reasoning index, she achieved a standard score of 84 and a percentile rank of 14 placing her in the low average range of functioning.  These data indicate that her visual/spatial processing skills were in the low average range of functioning.  Her auditory working Buyer, retail were in the impaired range of functioning and at only the 62nd percentile.  Cognitive processing speed skills are in the borderline range of functioning and at the 3rd percentile.  Mental status: Mood was euthymic while affect was broad and appropriate to mood.  Speech was goal-directed and the content was productive.  Thoughts were clear, coherent, relevant and rational.  She was oriented to person place and time.  Effort was exceptional, and as such the current data are considered valid and reliable indicators of her current level of functioning.  Diagnoses: Per medical record, schizophrenia, major depression disorder with psychotic features, borderline range of intellectual functioning.  Plan: Discussed psychological  testing results with Dr. Earley Favor.  These data will be utilized in disposition planning.

## 2022-08-20 NOTE — Progress Notes (Signed)
Nicole [name] took a shower this PM, ate and requested to talk with the social worker,[name]. She is calm and pleasant towards staff and her peers.

## 2022-08-20 NOTE — Progress Notes (Signed)
Received Nicole [name] this AM asleep in her chairbed, she continued to sleep and woke up on her own to eat breakfast. She was medication compliant and returned to her bed after lunch. She continues to sleep after lunch and snoring.

## 2022-08-20 NOTE — ED Notes (Signed)
Pt sleeping at present, no distress noted.  Monitoring for safety. 

## 2022-08-20 NOTE — ED Provider Notes (Signed)
Behavioral Health Progress Note  Date and Time: 08/20/2022 8:52 AM Name: Nicole [name] MRN:  [id number]  Subjective:   Nicole [name] is a 31 y.o. female, with PMH of SCZ unspecified, MDD with psychosis, who presented voluntary to [facility] (07/25/2022) via [facility] after a verbal altercation with Nicole [name] (not patient's legal guardian) for transient SI.  This is patient's fourth visit to [facility] for similar concerns this year. Patient has been dismissed from previous group home due to threatening SI and HI, then leaving the group home.   Legal Guardian: Mom Nicole [name]) transitioning to be Dad Nicole [name]) Point of contact: Dad Nicole [name])  On reassessment, pt is sleeping on my approach. She awakens easily to her name being called. Pt is a&ox3, in no acute distress, non-toxic appearing. She is calm, cooperative, attentive, pleasant. She reports "ok" mood. She denies mood disturbances, including depression, anxiety, euphoria. She denies SI/VI/HI, AVH, paranoia. There is no evidence of she is responding to internal stimuli, agitation, aggression, distractibility. She denies any psychiatric or medical complaints. Pt completed psychological assessment with Dr. Melvyn Neth yesterday. Psychological assessment to be sent to [facility] today for application completion. Pt remains psychiatrically cleared at this time.  Diagnosis:  Final diagnoses:  Severe episode of recurrent major depressive disorder, with psychotic features (HCC)    Total Time spent with patient: 15 minutes  Past Psychiatric History: SCZ unspecified, MDD with psychosis, Anxiety Past Medical History:  Past Medical History:  Diagnosis Date   Anxiety    Depression    Hypothyroidism 08/07/2022   Tobacco use disorder 08/07/2022    Past Surgical History:  Procedure Laterality Date   WISDOM TOOTH EXTRACTION Bilateral 2020   Family History:  Family History  Problem Relation Age of Onset   Hypertension Father    Diabetes Father     Family Psychiatric  History: None reported Social History:  Social History   Substance and Sexual Activity  Alcohol Use Never     Social History   Substance and Sexual Activity  Drug Use Never    Social History   Socioeconomic History   Marital status: Single    Spouse name: Not on file   Number of children: Not on file   Years of education: Not on file   Highest education level: Not on file  Occupational History   Not on file  Tobacco Use   Smoking status: Every Day    Types: Cigarettes   Smokeless tobacco: Never  Vaping Use   Vaping Use: Some days  Substance and Sexual Activity   Alcohol use: Never   Drug use: Never   Sexual activity: Yes    Partners: Female    Birth control/protection: Implant  Other Topics Concern   Not on file  Social History Narrative   Not on file   Social Determinants of Health   Financial Resource Strain: Not on file  Food Insecurity: Not on file  Transportation Needs: Not on file  Physical Activity: Not on file  Stress: Not on file  Social Connections: Not on file   SDOH:  SDOH Screenings   Depression (PHQ2-9): Low Risk  (06/10/2022)  Tobacco Use: High Risk (08/07/2022)   Additional Social History:    Pain Medications: SEE MAR Prescriptions: SEE MAR Over the Counter: SEE MAR History of alcohol / drug use?: Yes Longest period of sobriety (when/how long): N/A Negative Consequences of Use:  (NONE) Withdrawal Symptoms: None Name of Substance 1: ETOH IN PAST--PT CURRENTLY DENIES 1 - Frequency: SOCIAL  USE ETOH IN PAST 1 - Method of Aquiring: LEGAL 1- Route of Use: ORAL DRINK Name of Substance 2: [place] 2 - Amount (size/oz): LESS THAN ONE PACK 2 - Frequency: DAILY 2 - Method of Aquiring: LEGAL 2 - Route of Substance Use: ORAL SMOKE                Sleep: Good  Appetite:  Good  Current Medications:  Current Facility-Administered Medications  Medication Dose Route Frequency Provider Last  Rate Last Admin   acetaminophen (TYLENOL) tablet 650 mg  650 mg Oral Q6H PRN Onuoha, Chinwendu V, NP   650 mg at 08/15/22 1820   alum & mag hydroxide-simeth (MAALOX/MYLANTA) 200-200-20 MG/5ML suspension 30 mL  30 mL Oral Q4H PRN Onuoha, Chinwendu V, NP   30 mL at 07/27/22 2151   ARIPiprazole ER (ABILIFY MAINTENA) injection 400 mg  400 mg Intramuscular Q28 days Merrily Brittle, DO   400 mg at 08/02/22 1154   clotrimazole (LOTRIMIN) 1 % cream   Topical BID Bobbitt, Shalon E, NP   Given at 08/19/22 2119   hydrOXYzine (ATARAX) tablet 25 mg  25 mg Oral TID PRN Onuoha, Chinwendu V, NP   25 mg at 08/19/22 2116   ibuprofen (ADVIL) tablet 400 mg  400 mg Oral Q6H PRN Merrily Brittle, DO   400 mg at 08/08/22 0854   levothyroxine (SYNTHROID) tablet 100 mcg  100 mcg Oral Q0600 Merrily Brittle, DO   100 mcg at 08/20/22 0631   magnesium hydroxide (MILK OF MAGNESIA) suspension 30 mL  30 mL Oral Daily PRN Onuoha, Chinwendu V, NP   30 mL at 08/02/22 1956   nicotine (NICODERM CQ - dosed in mg/24 hours) patch 14 mg  14 mg Transdermal Q0600 Onuoha, Chinwendu V, NP   14 mg at 08/19/22 0855   ondansetron (ZOFRAN) tablet 4 mg  4 mg Oral Once Nwoko, Uchenna E, PA       Oxcarbazepine (TRILEPTAL) tablet 300 mg  300 mg Oral BID Onuoha, Chinwendu V, NP   300 mg at 08/19/22 2117   pantoprazole (PROTONIX) EC tablet 20 mg  20 mg Oral Daily Merrily Brittle, DO   20 mg at 08/19/22 0904   QUEtiapine (SEROQUEL) tablet 400 mg  400 mg Oral BID Onuoha, Chinwendu V, NP   400 mg at 08/19/22 2116   senna-docusate (Senokot-S) tablet 1 tablet  1 tablet Oral QHS Merrily Brittle, DO   1 tablet at 08/19/22 2117   sertraline (ZOLOFT) tablet 150 mg  150 mg Oral Daily Onuoha, Chinwendu V, NP   150 mg at 08/19/22 0906   traZODone (DESYREL) tablet 50 mg  50 mg Oral QHS PRN Onuoha, Chinwendu V, NP   50 mg at 08/19/22 2116   valACYclovir (VALTREX) tablet 500 mg  500 mg Oral Daily Merrily Brittle, DO   500 mg at 08/19/22 0906   Current Outpatient Medications   Medication Sig Dispense Refill   ABILIFY MAINTENA 400 MG PRSY prefilled syringe 400 mg every 28 (twenty-eight) days.     cetirizine (ZYRTEC) 10 MG tablet Take 10 mg by mouth daily.     cyclobenzaprine (FLEXERIL) 10 MG tablet Take 1 tablet (10 mg total) by mouth 2 (two) times daily as needed for muscle spasms. 20 tablet 0   fluticasone (FLONASE) 50 MCG/ACT nasal spray Place 1 spray into both nostrils daily.     hydrOXYzine (ATARAX) 50 MG tablet Take 1 tablet (50 mg total) by mouth 2 (two) times daily. 60 tablet  0   meloxicam (MOBIC) 15 MG tablet Take 15 mg by mouth daily.     Oxcarbazepine (TRILEPTAL) 300 MG tablet Take 1 tablet (300 mg total) by mouth 2 (two) times daily. 60 tablet 0   QUEtiapine (SEROQUEL) 400 MG tablet Take 1 tablet (400 mg total) by mouth 2 (two) times daily. 60 tablet 0   sertraline (ZOLOFT) 50 MG tablet Take 3 tablets (150 mg total) by mouth in the morning. 90 tablet 0   traZODone (DESYREL) 100 MG tablet Take 1 tablet (100 mg total) by mouth at bedtime. 30 tablet 0   valACYclovir (VALTREX) 500 MG tablet Take 500 mg by mouth daily.      Labs  Lab Results:  Admission on 07/25/2022  Component Date Value Ref Range Status   SARS Coronavirus 2 by RT PCR 07/25/2022 NEGATIVE  NEGATIVE Final   Comment: (NOTE) SARS-CoV-2 target nucleic acids are NOT DETECTED.  The SARS-CoV-2 RNA is generally detectable in upper respiratory specimens during the acute phase of infection. The lowest concentration of SARS-CoV-2 viral copies this assay can detect is 138 copies/mL. A negative result does not preclude SARS-Cov-2 infection and should not be used as the sole basis for treatment or other patient management decisions. A negative result may occur with  improper specimen collection/handling, submission of specimen other than nasopharyngeal swab, presence of viral mutation(s) within the areas targeted by this assay, and inadequate number of viral copies(<138 copies/mL). A negative  result must be combined with clinical observations, patient history, and epidemiological information. The expected result is Negative.  Fact Sheet for Patients:  BloggerCourse.com  Fact Sheet for Healthcare Providers:  SeriousBroker.it  This test is no                          t yet approved or cleared by the [place] FDA and  has been authorized for detection and/or diagnosis of SARS-CoV-2 by FDA under an Emergency Use Authorization (EUA). This EUA will remain  in effect (meaning this test can be used) for the duration of the COVID-19 declaration under Section 564(b)(1) of the Act, 21 U.S.C.section 360bbb-3(b)(1), unless the authorization is terminated  or revoked sooner.       Influenza A by PCR 07/25/2022 NEGATIVE  NEGATIVE Final   Influenza B by PCR 07/25/2022 NEGATIVE  NEGATIVE Final   Comment: (NOTE) The Xpert Xpress SARS-CoV-2/FLU/RSV plus assay is intended as an aid in the diagnosis of influenza from Nasopharyngeal swab specimens and should not be used as a sole basis for treatment. Nasal washings and aspirates are unacceptable for Xpert Xpress SARS-CoV-2/FLU/RSV testing.  Fact Sheet for Patients: BloggerCourse.com  Fact Sheet for Healthcare Providers: SeriousBroker.it  This test is not yet approved or cleared by the [place] FDA and has been authorized for detection and/or diagnosis of SARS-CoV-2 by FDA under an Emergency Use Authorization (EUA). This EUA will remain in effect (meaning this test can be used) for the duration of the COVID-19 declaration under Section 564(b)(1) of the Act, 21 U.S.C. section 360bbb-3(b)(1), unless the authorization is terminated or revoked.  Performed at [facility], [address], [place], [place] [address]    WBC 07/25/2022 8.3  4.0 - 10.5 K/uL Final   RBC 07/25/2022 4.44  3.87 - 5.11 MIL/uL Final   Hemoglobin  07/25/2022 13.7  12.0 - 15.0 g/dL Final   HCT 52/84/1324 40.2  36.0 - 46.0 % Final   MCV 07/25/2022 90.5  80.0 -  100.0 fL Final   MCH 07/25/2022 30.9  26.0 - 34.0 pg Final   MCHC 07/25/2022 34.1  30.0 - 36.0 g/dL Final   RDW 40/98/119110/09/2022 12.2  11.5 - 15.5 % Final   Platelets 07/25/2022 248  150 - 400 K/uL Final   nRBC 07/25/2022 0.0  0.0 - 0.2 % Final   Neutrophils Relative % 07/25/2022 43  % Final   Neutro Abs 07/25/2022 3.6  1.7 - 7.7 K/uL Final   Lymphocytes Relative 07/25/2022 52  % Final   Lymphs Abs 07/25/2022 4.2 (H)  0.7 - 4.0 K/uL Final   Monocytes Relative 07/25/2022 5  % Final   Monocytes Absolute 07/25/2022 0.4  0.1 - 1.0 K/uL Final   Eosinophils Relative 07/25/2022 0  % Final   Eosinophils Absolute 07/25/2022 0.0  0.0 - 0.5 K/uL Final   Basophils Relative 07/25/2022 0  % Final   Basophils Absolute 07/25/2022 0.0  0.0 - 0.1 K/uL Final   Immature Granulocytes 07/25/2022 0  % Final   Abs Immature Granulocytes 07/25/2022 0.02  0.00 - 0.07 K/uL Final   Performed at [facility], [address] [place], [place] [address]   Sodium 07/25/2022 138  135 - 145 mmol/L Final   Potassium 07/25/2022 4.0  3.5 - 5.1 mmol/L Final   Chloride 07/25/2022 104  98 - 111 mmol/L Final   CO2 07/25/2022 29  22 - 32 mmol/L Final   Glucose, Bld 07/25/2022 83  70 - 99 mg/dL Final   Glucose reference range applies only to samples taken after fasting for at least 8 hours.   BUN 07/25/2022 11  6 - 20 mg/dL Final   Creatinine, Ser 07/25/2022 0.97  0.44 - 1.00 mg/dL Final   Calcium 56/21/308610/09/2022 9.2  8.9 - 10.3 mg/dL Final   Total Protein 57/84/696210/09/2022 7.0  6.5 - 8.1 g/dL Final   Albumin 95/28/413210/09/2022 3.8  3.5 - 5.0 g/dL Final   AST 44/01/027210/09/2022 18  15 - 41 U/L Final   ALT 07/25/2022 22  0 - 44 U/L Final   Alkaline Phosphatase 07/25/2022 64  38 - 126 U/L Final   Total Bilirubin 07/25/2022 0.2 (L)  0.3 - 1.2 mg/dL Final   GFR, Estimated 07/25/2022 >60  >60 mL/min Final   Comment: (NOTE) Calculated using the  CKD-EPI Creatinine Equation (2021)    Anion gap 07/25/2022 5  5 - 15 Final   Performed at [facility], [address] [place], [place] [address]   POC Amphetamine UR 07/25/2022 None Detected  NONE DETECTED (Cut Off Level 1000 ng/mL) Preliminary   POC Secobarbital (BAR) 07/25/2022 None Detected  NONE DETECTED (Cut Off Level 300 ng/mL) Preliminary   POC Buprenorphine (BUP) 07/25/2022 None Detected  NONE DETECTED (Cut Off Level 10 ng/mL) Preliminary   POC Oxazepam (BZO) 07/25/2022 None Detected  NONE DETECTED (Cut Off Level 300 ng/mL) Preliminary   POC Cocaine UR 07/25/2022 None Detected  NONE DETECTED (Cut Off Level 300 ng/mL) Preliminary   POC Methamphetamine UR 07/25/2022 None Detected  NONE DETECTED (Cut Off Level 1000 ng/mL) Preliminary   POC Morphine 07/25/2022 None Detected  NONE DETECTED (Cut Off Level 300 ng/mL) Preliminary   POC Methadone UR 07/25/2022 None Detected  NONE DETECTED (Cut Off Level 300 ng/mL) Preliminary   POC Oxycodone UR 07/25/2022 None Detected  NONE DETECTED (Cut Off Level 100 ng/mL) Preliminary   POC Marijuana UR 07/25/2022 None Detected  NONE DETECTED (Cut Off Level 50 ng/mL) Preliminary   SARSCOV2ONAVIRUS  2 AG 07/25/2022 NEGATIVE  NEGATIVE Final   Comment: (NOTE) SARS-CoV-2 antigen NOT DETECTED.   Negative results are presumptive.  Negative results do not preclude SARS-CoV-2 infection and should not be used as the sole basis for treatment or other patient management decisions, including infection  control decisions, particularly in the presence of clinical signs and  symptoms consistent with COVID-19, or in those who have been in contact with the virus.  Negative results must be combined with clinical observations, patient history, and epidemiological information. The expected result is Negative.  Fact Sheet for Patients: https://www.jennings-kim.com/  Fact Sheet for Healthcare Providers: https://alexander-rogers.biz/  This  test is not yet approved or cleared by the [place] FDA and  has been authorized for detection and/or diagnosis of SARS-CoV-2 by FDA under an Emergency Use Authorization (EUA).  This EUA will remain in effect (meaning this test can be used) for the duration of  the COV                          ID-19 declaration under Section 564(b)(1) of the Act, 21 U.S.C. section 360bbb-3(b)(1), unless the authorization is terminated or revoked sooner.     Cholesterol 07/25/2022 181  0 - 200 mg/dL Final   Triglycerides 02/72/5366 118  <150 mg/dL Final   HDL 44/12/4740 45  >40 mg/dL Final   Total CHOL/HDL Ratio 07/25/2022 4.0  RATIO Final   VLDL 07/25/2022 24  0 - 40 mg/dL Final   LDL Cholesterol 07/25/2022 112 (H)  0 - 99 mg/dL Final   Comment:        Total Cholesterol/HDL:CHD Risk Coronary Heart Disease Risk Table                     Men   Women  1/2 Average Risk   3.4   3.3  Average Risk       5.0   4.4  2 X Average Risk   9.6   7.1  3 X Average Risk  23.4   11.0        Use the calculated Patient Ratio above and the CHD Risk Table to determine the patient's CHD Risk.        ATP III CLASSIFICATION (LDL):  <100     mg/dL   Optimal  595-638  mg/dL   Near or Above                    Optimal  130-159  mg/dL   Borderline  756-433  mg/dL   High  >295     mg/dL   Very High Performed at [facility], [address] [place], [place] [address]    TSH 07/25/2022 6.668 (H)  0.350 - 4.500 uIU/mL Final   Comment: Performed by a 3rd Generation assay with a functional sensitivity of <=0.01 uIU/mL. Performed at [facility], [address] [place], [place] [address]    Glucose-Capillary 07/26/2022 104 (H)  70 - 99 mg/dL Final   Glucose reference range applies only to samples taken after fasting for at least 8 hours.   T3, Free 07/31/2022 2.3  2.0 - 4.4 pg/mL Final   Comment: (NOTE) Performed At: [facility] [address] [place], [place] [id number] Jolene Schimke MD  [id number]    Free T4 07/31/2022 0.60 (L)  0.61 - 1.12 ng/dL Final   Comment: (NOTE) Biotin ingestion may interfere with free T4 tests. If the  results are inconsistent with the TSH level, previous test results, or the clinical presentation, then consider biotin interference. If needed, order repeat testing after stopping biotin. Performed at [facility], [address] [place], [place] [address]   Admission on 07/04/2022, Discharged on 07/05/2022  Component Date Value Ref Range Status   Sodium 07/04/2022 142  135 - 145 mmol/L Final   Potassium 07/04/2022 4.4  3.5 - 5.1 mmol/L Final   Chloride 07/04/2022 109  98 - 111 mmol/L Final   CO2 07/04/2022 26  22 - 32 mmol/L Final   Glucose, Bld 07/04/2022 96  70 - 99 mg/dL Final   Glucose reference range applies only to samples taken after fasting for at least 8 hours.   BUN 07/04/2022 15  6 - 20 mg/dL Final   Creatinine, Ser 07/04/2022 0.83  0.44 - 1.00 mg/dL Final   Calcium 60/45/4098 9.3  8.9 - 10.3 mg/dL Final   Total Protein 11/91/4782 7.2  6.5 - 8.1 g/dL Final   Albumin 95/62/1308 3.9  3.5 - 5.0 g/dL Final   AST 65/78/4696 23  15 - 41 U/L Final   ALT 07/04/2022 28  0 - 44 U/L Final   Alkaline Phosphatase 07/04/2022 77  38 - 126 U/L Final   Total Bilirubin 07/04/2022 0.4  0.3 - 1.2 mg/dL Final   GFR, Estimated 07/04/2022 >60  >60 mL/min Final   Comment: (NOTE) Calculated using the CKD-EPI Creatinine Equation (2021)    Anion gap 07/04/2022 7  5 - 15 Final   Performed at [facility], [address], [place], [place] [address]   Alcohol, Ethyl (B) 07/04/2022 <10  <10 mg/dL Final   Comment: (NOTE) Lowest detectable limit for serum alcohol is 10 mg/dL.  For medical purposes only. Performed at [facility], [address], [place], [place] [address]    WBC 07/04/2022 7.0  4.0 - 10.5 K/uL Final   RBC 07/04/2022 4.18  3.87 - 5.11 MIL/uL Final   Hemoglobin 07/04/2022 12.8  12.0 - 15.0  g/dL Final   HCT 40/07/2724 39.1  36.0 - 46.0 % Final   MCV 07/04/2022 93.5  80.0 - 100.0 fL Final   MCH 07/04/2022 30.6  26.0 - 34.0 pg Final   MCHC 07/04/2022 32.7  30.0 - 36.0 g/dL Final   RDW 36/64/4034 12.9  11.5 - 15.5 % Final   Platelets 07/04/2022 243  150 - 400 K/uL Final   nRBC 07/04/2022 0.0  0.0 - 0.2 % Final   Neutrophils Relative % 07/04/2022 43  % Final   Neutro Abs 07/04/2022 3.0  1.7 - 7.7 K/uL Final   Lymphocytes Relative 07/04/2022 50  % Final   Lymphs Abs 07/04/2022 3.5  0.7 - 4.0 K/uL Final   Monocytes Relative 07/04/2022 7  % Final   Monocytes Absolute 07/04/2022 0.5  0.1 - 1.0 K/uL Final   Eosinophils Relative 07/04/2022 0  % Final   Eosinophils Absolute 07/04/2022 0.0  0.0 - 0.5 K/uL Final   Basophils Relative 07/04/2022 0  % Final   Basophils Absolute 07/04/2022 0.0  0.0 - 0.1 K/uL Final   Immature Granulocytes 07/04/2022 0  % Final   Abs Immature Granulocytes 07/04/2022 0.01  0.00 - 0.07 K/uL Final   Performed at [facility], [address], [place], [place] [address]   I-stat hCG, quantitative 07/04/2022 <5.0  <5 mIU/mL Final   Comment 3 07/04/2022  Final   Comment:   GEST. AGE      CONC.  (mIU/mL)   <=1 WEEK        5 - 50     2 WEEKS       50 - 500     3 WEEKS       100 - 10,000     4 WEEKS     1,000 - 30,000        FEMALE AND NON-PREGNANT FEMALE:     LESS THAN 5 mIU/mL   Admission on 06/14/2022, Discharged on 06/14/2022  Component Date Value Ref Range Status   Color, UA 06/14/2022 yellow  yellow Final   Clarity, UA 06/14/2022 cloudy (A)  clear Final   Glucose, UA 06/14/2022 negative  negative mg/dL Final   Bilirubin, UA 16/07/9603 negative  negative Final   Ketones, POC UA 06/14/2022 negative  negative mg/dL Final   Spec Grav, UA 54/06/8118 1.025  1.010 - 1.025 Final   Blood, UA 06/14/2022 negative  negative Final   pH, UA 06/14/2022 7.5  5.0 - 8.0 Final   Protein Ur, POC 06/14/2022 =30 (A)  negative mg/dL Final    Urobilinogen, UA 06/14/2022 0.2  0.2 or 1.0 E.U./dL Final   Nitrite, UA 14/78/2956 Negative  Negative Final   Leukocytes, UA 06/14/2022 Negative  Negative Final   Preg Test, Ur 06/14/2022 Negative  Negative Final   Specimen Description 06/14/2022 URINE, CLEAN CATCH   Final   Special Requests 06/14/2022    Final                   Value:NONE Performed at [facility], [address], [place], [place] [address]    Culture 06/14/2022 MULTIPLE SPECIES PRESENT, SUGGEST RECOLLECTION (A)   Final   Report Status 06/14/2022 06/16/2022 FINAL   Final  Admission on 06/07/2022, Discharged on 06/10/2022  Component Date Value Ref Range Status   SARS Coronavirus 2 by RT PCR 06/07/2022 NEGATIVE  NEGATIVE Final   Comment: (NOTE) SARS-CoV-2 target nucleic acids are NOT DETECTED.  The SARS-CoV-2 RNA is generally detectable in upper respiratory specimens during the acute phase of infection. The lowest concentration of SARS-CoV-2 viral copies this assay can detect is 138 copies/mL. A negative result does not preclude SARS-Cov-2 infection and should not be used as the sole basis for treatment or other patient management decisions. A negative result may occur with  improper specimen collection/handling, submission of specimen other than nasopharyngeal swab, presence of viral mutation(s) within the areas targeted by this assay, and inadequate number of viral copies(<138 copies/mL). A negative result must be combined with clinical observations, patient history, and epidemiological information. The expected result is Negative.  Fact Sheet for Patients:  BloggerCourse.com  Fact Sheet for Healthcare Providers:  SeriousBroker.it  This test is no                          t yet approved or cleared by the [place] FDA and  has been authorized for detection and/or diagnosis of SARS-CoV-2 by FDA under an Emergency Use Authorization (EUA). This EUA will  remain  in effect (meaning this test can be used) for the duration of the COVID-19 declaration under Section 564(b)(1) of the Act, 21 U.S.C.section 360bbb-3(b)(1), unless the authorization is terminated  or revoked sooner.       Influenza A by PCR 06/07/2022 NEGATIVE  NEGATIVE Final   Influenza B by PCR 06/07/2022 NEGATIVE  NEGATIVE Final   Comment: (NOTE) The Xpert Xpress SARS-CoV-2/FLU/RSV plus assay is intended as an aid in the diagnosis of influenza from Nasopharyngeal swab specimens and should not be used as a sole basis for treatment. Nasal washings and aspirates are unacceptable for Xpert Xpress SARS-CoV-2/FLU/RSV testing.  Fact Sheet for Patients: BloggerCourse.com  Fact Sheet for Healthcare Providers: SeriousBroker.it  This test is not yet approved or cleared by the [place] FDA and has been authorized for detection and/or diagnosis of SARS-CoV-2 by FDA under an Emergency Use Authorization (EUA). This EUA will remain in effect (meaning this test can be used) for the duration of the COVID-19 declaration under Section 564(b)(1) of the Act, 21 U.S.C. section 360bbb-3(b)(1), unless the authorization is terminated or revoked.  Performed at [facility], [address], [place], [place] [address]    WBC 06/07/2022 5.7  4.0 - 10.5 K/uL Final   RBC 06/07/2022 4.39  3.87 - 5.11 MIL/uL Final   Hemoglobin 06/07/2022 13.2  12.0 - 15.0 g/dL Final   HCT 60/45/4098 40.4  36.0 - 46.0 % Final   MCV 06/07/2022 92.0  80.0 - 100.0 fL Final   MCH 06/07/2022 30.1  26.0 - 34.0 pg Final   MCHC 06/07/2022 32.7  30.0 - 36.0 g/dL Final   RDW 11/91/4782 12.4  11.5 - 15.5 % Final   Platelets 06/07/2022 308  150 - 400 K/uL Final   nRBC 06/07/2022 0.0  0.0 - 0.2 % Final   Neutrophils Relative % 06/07/2022 42  % Final   Neutro Abs 06/07/2022 2.4  1.7 - 7.7 K/uL Final   Lymphocytes Relative 06/07/2022 54  % Final   Lymphs Abs  06/07/2022 3.1  0.7 - 4.0 K/uL Final   Monocytes Relative 06/07/2022 4  % Final   Monocytes Absolute 06/07/2022 0.2  0.1 - 1.0 K/uL Final   Eosinophils Relative 06/07/2022 0  % Final   Eosinophils Absolute 06/07/2022 0.0  0.0 - 0.5 K/uL Final   Basophils Relative 06/07/2022 0  % Final   Basophils Absolute 06/07/2022 0.0  0.0 - 0.1 K/uL Final   Immature Granulocytes 06/07/2022 0  % Final   Abs Immature Granulocytes 06/07/2022 0.01  0.00 - 0.07 K/uL Final   Performed at [facility], [address] [place], [place] [address]   Sodium 06/07/2022 139  135 - 145 mmol/L Final   Potassium 06/07/2022 4.0  3.5 - 5.1 mmol/L Final   Chloride 06/07/2022 104  98 - 111 mmol/L Final   CO2 06/07/2022 28  22 - 32 mmol/L Final   Glucose, Bld 06/07/2022 104 (H)  70 - 99 mg/dL Final   Glucose reference range applies only to samples taken after fasting for at least 8 hours.   BUN 06/07/2022 8  6 - 20 mg/dL Final   Creatinine, Ser 06/07/2022 0.84  0.44 - 1.00 mg/dL Final   Calcium 30/86/5784 9.1  8.9 - 10.3 mg/dL Final   Total Protein 69/62/9528 6.9  6.5 - 8.1 g/dL Final   Albumin 41/32/4401 3.7  3.5 - 5.0 g/dL Final   AST 02/72/5366 19  15 - 41 U/L Final   ALT 06/07/2022 22  0 - 44 U/L Final   Alkaline Phosphatase 06/07/2022 54  38 - 126 U/L Final   Total Bilirubin 06/07/2022 0.4  0.3 - 1.2 mg/dL Final   GFR, Estimated 06/07/2022 >60  >60 mL/min Final   Comment: (NOTE) Calculated using the CKD-EPI Creatinine Equation (2021)    Anion  gap 06/07/2022 7  5 - 15 Final   Performed at [facility], [address], [place], [place] [address]   Hgb A1c MFr Bld 06/07/2022 5.0  4.8 - 5.6 % Final   Comment: (NOTE) Pre diabetes:          5.7%-6.4%  Diabetes:              >6.4%  Glycemic control for   <7.0% adults with diabetes    Mean Plasma Glucose 06/07/2022 96.8  mg/dL Final   Performed at [facility], [address] [place], [place] [address]   TSH 06/07/2022 1.620  0.350 - 4.500  uIU/mL Final   Comment: Performed by a 3rd Generation assay with a functional sensitivity of <=0.01 uIU/mL. Performed at [facility], [address], [place], [place] [address]    RPR Ser Ql 06/07/2022 NON REACTIVE  NON REACTIVE Final   Performed at [facility], [address] [place], [place] [address]   Color, Urine 06/07/2022 YELLOW  YELLOW Final   APPearance 06/07/2022 HAZY (A)  CLEAR Final   Specific Gravity, Urine 06/07/2022 1.018  1.005 - 1.030 Final   pH 06/07/2022 7.0  5.0 - 8.0 Final   Glucose, UA 06/07/2022 NEGATIVE  NEGATIVE mg/dL Final   Hgb urine dipstick 06/07/2022 NEGATIVE  NEGATIVE Final   Bilirubin Urine 06/07/2022 NEGATIVE  NEGATIVE Final   Ketones, ur 06/07/2022 NEGATIVE  NEGATIVE mg/dL Final   Protein, ur 01/60/1093 NEGATIVE  NEGATIVE mg/dL Final   Nitrite 23/55/7322 NEGATIVE  NEGATIVE Final   Leukocytes,Ua 06/07/2022 NEGATIVE  NEGATIVE Final   Performed at [facility], [address] [place], [place] [address]   Cholesterol 06/07/2022 164  0 - 200 mg/dL Final   Triglycerides 70/62/3762 158 (H)  <150 mg/dL Final   HDL 83/15/1761 44  >40 mg/dL Final   Total CHOL/HDL Ratio 06/07/2022 3.7  RATIO Final   VLDL 06/07/2022 32  0 - 40 mg/dL Final   LDL Cholesterol 06/07/2022 88  0 - 99 mg/dL Final   Comment:        Total Cholesterol/HDL:CHD Risk Coronary Heart Disease Risk Table                     Men   Women  1/2 Average Risk   3.4   3.3  Average Risk       5.0   4.4  2 X Average Risk   9.6   7.1  3 X Average Risk  23.4   11.0        Use the calculated Patient Ratio above and the CHD Risk Table to determine the patient's CHD Risk.        ATP III CLASSIFICATION (LDL):  <100     mg/dL   Optimal  607-371  mg/dL   Near or Above                    Optimal  130-159  mg/dL   Borderline  062-694  mg/dL   High  >854     mg/dL   Very High Performed at [facility], [address], [place], [place] [address]    HIV Screen 4th Generation  wRfx 06/07/2022 Non Reactive  Non Reactive Final   Performed at [facility], [address] [place], [place] [address]   SARSCOV2ONAVIRUS 2 AG 06/07/2022 NEGATIVE  NEGATIVE Final   Comment: (NOTE)  SARS-CoV-2 antigen NOT DETECTED.   Negative results are presumptive.  Negative results do not preclude SARS-CoV-2 infection and should not be used as the sole basis for treatment or other patient management decisions, including infection  control decisions, particularly in the presence of clinical signs and  symptoms consistent with COVID-19, or in those who have been in contact with the virus.  Negative results must be combined with clinical observations, patient history, and epidemiological information. The expected result is Negative.  Fact Sheet for Patients: https://www.jennings-kim.com/  Fact Sheet for Healthcare Providers: https://alexander-rogers.biz/  This test is not yet approved or cleared by the [place] FDA and  has been authorized for detection and/or diagnosis of SARS-CoV-2 by FDA under an Emergency Use Authorization (EUA).  This EUA will remain in effect (meaning this test can be used) for the duration of  the COV                          ID-19 declaration under Section 564(b)(1) of the Act, 21 U.S.C. section 360bbb-3(b)(1), unless the authorization is terminated or revoked sooner.     POC Amphetamine UR 06/07/2022 None Detected  NONE DETECTED (Cut Off Level 1000 ng/mL) Final   POC Secobarbital (BAR) 06/07/2022 None Detected  NONE DETECTED (Cut Off Level 300 ng/mL) Final   POC Buprenorphine (BUP) 06/07/2022 None Detected  NONE DETECTED (Cut Off Level 10 ng/mL) Final   POC Oxazepam (BZO) 06/07/2022 None Detected  NONE DETECTED (Cut Off Level 300 ng/mL) Final   POC Cocaine UR 06/07/2022 None Detected  NONE DETECTED (Cut Off Level 300 ng/mL) Final   POC Methamphetamine UR 06/07/2022 None Detected  NONE DETECTED (Cut Off Level 1000 ng/mL)  Final   POC Morphine 06/07/2022 None Detected  NONE DETECTED (Cut Off Level 300 ng/mL) Final   POC Methadone UR 06/07/2022 None Detected  NONE DETECTED (Cut Off Level 300 ng/mL) Final   POC Oxycodone UR 06/07/2022 None Detected  NONE DETECTED (Cut Off Level 100 ng/mL) Final   POC Marijuana UR 06/07/2022 None Detected  NONE DETECTED (Cut Off Level 50 ng/mL) Final   Preg Test, Ur 06/08/2022 NEGATIVE  NEGATIVE Final   Comment:        THE SENSITIVITY OF THIS METHODOLOGY IS >20 mIU/mL. Performed at [facility], [address] [place], [place] [address]    Preg Test, Ur 06/08/2022 NEGATIVE  NEGATIVE Final   Comment:        THE SENSITIVITY OF THIS METHODOLOGY IS >24 mIU/mL     Blood Alcohol level:  Lab Results  Component Value Date   ETH <10 07/04/2022   ETH <10 02/07/2022    Metabolic Disorder Labs: Lab Results  Component Value Date   HGBA1C 5.0 06/07/2022   MPG 96.8 06/07/2022   MPG 96.8 02/07/2022   No results found for: "PROLACTIN" Lab Results  Component Value Date   CHOL 181 07/25/2022   TRIG 118 07/25/2022   HDL 45 07/25/2022   CHOLHDL 4.0 07/25/2022   VLDL 24 07/25/2022   LDLCALC 112 (H) 07/25/2022   LDLCALC 88 06/07/2022    Therapeutic Lab Levels: No results found for: "LITHIUM" No results found for: "VALPROATE" No results found for: "CBMZ"  Physical Findings   GAD-7    Flowsheet Row Office Visit from 12/17/2021 [facility]  Total GAD-7 Score 17      PHQ2-9    Flowsheet Row ED from 06/07/2022 in [facility] Office Visit from 12/17/2021 in [facility]  PHQ-2 Total Score 1 2  PHQ-9 Total Score 2 8      Flowsheet Row ED from 07/25/2022 in [facility] ED from 07/04/2022 in [facility] from 06/14/2022 in [facility]   C-SSRS RISK CATEGORY High Risk No Risk No Risk         Musculoskeletal  Strength & Muscle Tone: within normal limits Gait & Station: normal Patient leans: N/A  Psychiatric Specialty Exam  Presentation  General Appearance:  Appropriate for Environment  Eye Contact: Good  Speech: Clear and Coherent; Normal Rate  Speech Volume: Normal  Handedness: Right   Mood and Affect  Mood: Euthymic  Affect: Congruent; Appropriate   Thought Process  Thought Processes: Coherent  Descriptions of Associations:Intact  Orientation:Full (Time, Place and Person)  Thought Content:Logical  Diagnosis of Schizophrenia or Schizoaffective disorder in past: No    Hallucinations:Hallucinations: None  Ideas of Reference:None  Suicidal Thoughts:Suicidal Thoughts: No  Homicidal Thoughts:Homicidal Thoughts: No   Sensorium  Memory: Immediate Fair  Judgment: Intact  Insight: Present   Executive Functions  Concentration: Fair  Attention Span: Fair  Recall: [employer] of Knowledge: Fair  Language: Fair   Psychomotor Activity  Psychomotor Activity: Psychomotor Activity: Normal   Assets  Assets: Communication Skills; Desire for Improvement   Sleep  Sleep: Sleep: Good   No data recorded  Physical Exam  Physical Exam Constitutional:      General: She is not in acute distress.    Appearance: She is not ill-appearing, toxic-appearing or diaphoretic.  Eyes:     General: No scleral icterus. Pulmonary:     Effort: Pulmonary effort is normal. No respiratory distress.  Neurological:     Mental Status: She is alert and oriented to person, place, and time.  Psychiatric:        Attention and Perception: Attention and perception normal.        Mood and Affect: Mood and affect normal.        Speech: Speech normal.        Behavior: Behavior normal. Behavior is cooperative.        Thought Content: Thought content normal.        Cognition and Memory: Cognition and memory normal.    Review of Systems   Constitutional:  Negative for chills and fever.  Respiratory:  Negative for shortness of breath.   Cardiovascular:  Negative for chest pain and palpitations.  Gastrointestinal:  Negative for abdominal pain.  Neurological:  Negative for headaches.   Blood pressure 129/77, pulse (!) 115, temperature 98.5 F (36.9 C), temperature source Oral, resp. rate 18, SpO2 99 %. There is no height or weight on file to calculate BMI.  Treatment Plan Summary: Plan Disposition is pending placement  Lauree Chandler, NP 08/20/2022 8:52 AM

## 2022-08-20 NOTE — ED Notes (Signed)
Pt A&O x 4, resting at present, no distress noted. Calm & cooperative. Watching TV at present, monitoring for safety.

## 2022-08-21 NOTE — ED Notes (Signed)
Patient watching TV - denies SI HI AVH - will continue to monitor

## 2022-08-21 NOTE — Progress Notes (Signed)
[name] Progress Note   Disposition meeting with Nash Dimmer, [facility] DSS, and [name], [facility] Care Coordinator was held at 1400.  At this time, Ms. Lajoyce Corners will look for IDD group homes.  No placement has been found at this time. Ms Mayford Knife stated she is assisting the pt's father, [name], with transferring legal guardianship to him, but this must be done through [facility].   Hansel Starling, MSW, [name] [facility] [phone] phone [phone] fax

## 2022-08-21 NOTE — ED Notes (Signed)
Pt resting quietly, breathing is even and unlabored.  Pt denies SI, HI, pain and AVH.  Will continue to monitor for safety.  

## 2022-08-21 NOTE — Care Management (Signed)
Care Management   Dr. Melvyn Neth completed the IQ testing for the patient on 08-20-2022.  Writer submitted the referral for group home placement to [facility]    Writer coordinated a Immunologist for placement on 08-21-22 at 2pm with [facility] and [facility] Social Worker [place]

## 2022-08-21 NOTE — ED Notes (Signed)
No pain or discomfort noted/ reported. Pt alert, oriented, and ambulatory.  Breathing is even and  unlabored.  Will continue to monitor for safety. 

## 2022-08-21 NOTE — ED Provider Notes (Signed)
Behavioral Health Progress Note  Date and Time: 08/21/2022 8:29 AM Name: Nicole [name] MRN:  [id number]  Subjective:   Nicole [name] is a 31 y.o. female, with PMH of SCZ unspecified, MDD with psychosis, who presented voluntary to [facility] (07/25/2022) via [facility] after a verbal altercation with [name] (not patient's legal guardian) for transient SI.  This is patient's fourth visit to [facility] for similar concerns this year. Patient has been dismissed from previous group home due to threatening SI and HI, then leaving the group home.   Legal Guardian: [name]   On reassessment, pt denies SI/VI/HI, AVH, paranoia. She is a&ox3, in  no acute distress, non-toxic appearing. She is calm, cooperative, attentive, pleasant. There is no evidence of she is responding to internal stimuli, agitation, aggression, distractibility. She denies any psychiatric or medical complaints. Pt remains psychiatrically cleared. Disposition is pending placement.   Diagnosis:  Final diagnoses:  Severe episode of recurrent major depressive disorder, with psychotic features (HCC)    Total Time spent with patient: 15 minutes  Past Psychiatric History: SCZ unspecified, MDD with psychosis, Anxiety Past Medical History:  Past Medical History:  Diagnosis Date   Anxiety    Depression    Hypothyroidism 08/07/2022   Tobacco use disorder 08/07/2022    Past Surgical History:  Procedure Laterality Date   WISDOM TOOTH EXTRACTION Bilateral 2020   Family History:  Family History  Problem Relation Age of Onset   Hypertension Father    Diabetes Father    Family Psychiatric  History: None reported Social History:  Social History   Substance and Sexual Activity  Alcohol Use Never     Social History   Substance and Sexual Activity  Drug Use Never    Social History   Socioeconomic History   Marital status: Single    Spouse name:  Not on file   Number of children: Not on file   Years of education: Not on file   Highest education level: Not on file  Occupational History   Not on file  Tobacco Use   Smoking status: Every Day    Types: Cigarettes   Smokeless tobacco: Never  Vaping Use   Vaping Use: Some days  Substance and Sexual Activity   Alcohol use: Never   Drug use: Never   Sexual activity: Yes    Partners: Female    Birth control/protection: Implant  Other Topics Concern   Not on file  Social History Narrative   Not on file   Social Determinants of Health   Financial Resource Strain: Not on file  Food Insecurity: Not on file  Transportation Needs: Not on file  Physical Activity: Not on file  Stress: Not on file  Social Connections: Not on file   SDOH:  SDOH Screenings   Depression (PHQ2-9): Low Risk  (06/10/2022)  Tobacco Use: High Risk (08/07/2022)   Additional Social History:    Pain Medications: SEE MAR Prescriptions: SEE MAR Over the Counter: SEE MAR History of alcohol / drug use?: Yes Longest period of sobriety (when/how long): N/A Negative Consequences of Use:  (NONE) Withdrawal Symptoms: None Name of Substance 1: ETOH IN PAST--PT CURRENTLY DENIES 1 - Frequency: SOCIAL USE ETOH IN PAST 1 - Method of Aquiring: LEGAL 1- Route of Use: ORAL DRINK Name of Substance 2: NICOTINE-VAPE AND CIGARETTES 2 - Amount (size/oz): LESS THAN ONE PACK 2 - Frequency: DAILY 2 - Method of Aquiring: LEGAL  2 - Route of Substance Use: ORAL SMOKE                Sleep: Good  Appetite:  Good  Current Medications:  Current Facility-Administered Medications  Medication Dose Route Frequency Provider Last Rate Last Admin   acetaminophen (TYLENOL) tablet 650 mg  650 mg Oral Q6H PRN Onuoha, Chinwendu V, NP   650 mg at 08/15/22 1820   alum & mag hydroxide-simeth (MAALOX/MYLANTA) 200-200-20 MG/5ML suspension 30 mL  30 mL Oral Q4H PRN Onuoha, Chinwendu V, NP   30 mL at 07/27/22 2151   ARIPiprazole ER  (ABILIFY MAINTENA) injection 400 mg  400 mg Intramuscular Q28 days Princess Bruins, DO   400 mg at 08/02/22 1154   clotrimazole (LOTRIMIN) 1 % cream   Topical BID Bobbitt, Shalon E, NP   Given at 08/20/22 2113   hydrOXYzine (ATARAX) tablet 25 mg  25 mg Oral TID PRN Onuoha, Chinwendu V, NP   25 mg at 08/20/22 2111   ibuprofen (ADVIL) tablet 400 mg  400 mg Oral Q6H PRN Princess Bruins, DO   400 mg at 08/08/22 0854   levothyroxine (SYNTHROID) tablet 100 mcg  100 mcg Oral Q0600 Princess Bruins, DO   100 mcg at 08/21/22 0647   magnesium hydroxide (MILK OF MAGNESIA) suspension 30 mL  30 mL Oral Daily PRN Onuoha, Chinwendu V, NP   30 mL at 08/02/22 1956   nicotine (NICODERM CQ - dosed in mg/24 hours) patch 14 mg  14 mg Transdermal Q0600 Onuoha, Chinwendu V, NP   14 mg at 08/20/22 1043   ondansetron (ZOFRAN) tablet 4 mg  4 mg Oral Once Nwoko, Uchenna E, PA       Oxcarbazepine (TRILEPTAL) tablet 300 mg  300 mg Oral BID Onuoha, Chinwendu V, NP   300 mg at 08/20/22 2111   pantoprazole (PROTONIX) EC tablet 20 mg  20 mg Oral Daily Princess Bruins, DO   20 mg at 08/20/22 1042   QUEtiapine (SEROQUEL) tablet 400 mg  400 mg Oral BID Onuoha, Chinwendu V, NP   400 mg at 08/20/22 2111   senna-docusate (Senokot-S) tablet 1 tablet  1 tablet Oral QHS Princess Bruins, DO   1 tablet at 08/20/22 2111   sertraline (ZOLOFT) tablet 150 mg  150 mg Oral Daily Onuoha, Chinwendu V, NP   150 mg at 08/20/22 1042   traZODone (DESYREL) tablet 50 mg  50 mg Oral QHS PRN Onuoha, Chinwendu V, NP   50 mg at 08/20/22 2111   valACYclovir (VALTREX) tablet 500 mg  500 mg Oral Daily Princess Bruins, DO   500 mg at 08/20/22 1041   Current Outpatient Medications  Medication Sig Dispense Refill   ABILIFY MAINTENA 400 MG PRSY prefilled syringe 400 mg every 28 (twenty-eight) days.     cetirizine (ZYRTEC) 10 MG tablet Take 10 mg by mouth daily.     cyclobenzaprine (FLEXERIL) 10 MG tablet Take 1 tablet (10 mg total) by mouth 2 (two) times daily as needed for  muscle spasms. 20 tablet 0   fluticasone (FLONASE) 50 MCG/ACT nasal spray Place 1 spray into both nostrils daily.     hydrOXYzine (ATARAX) 50 MG tablet Take 1 tablet (50 mg total) by mouth 2 (two) times daily. 60 tablet 0   meloxicam (MOBIC) 15 MG tablet Take 15 mg by mouth daily.     Oxcarbazepine (TRILEPTAL) 300 MG tablet Take 1 tablet (300 mg total) by mouth 2 (two) times daily. 60 tablet 0   QUEtiapine (  SEROQUEL) 400 MG tablet Take 1 tablet (400 mg total) by mouth 2 (two) times daily. 60 tablet 0   sertraline (ZOLOFT) 50 MG tablet Take 3 tablets (150 mg total) by mouth in the morning. 90 tablet 0   traZODone (DESYREL) 100 MG tablet Take 1 tablet (100 mg total) by mouth at bedtime. 30 tablet 0   valACYclovir (VALTREX) 500 MG tablet Take 500 mg by mouth daily.      Labs  Lab Results:  Admission on 07/25/2022  Component Date Value Ref Range Status   SARS Coronavirus 2 by RT PCR 07/25/2022 NEGATIVE  NEGATIVE Final   Comment: (NOTE) SARS-CoV-2 target nucleic acids are NOT DETECTED.  The SARS-CoV-2 RNA is generally detectable in upper respiratory specimens during the acute phase of infection. The lowest concentration of SARS-CoV-2 viral copies this assay can detect is 138 copies/mL. A negative result does not preclude SARS-Cov-2 infection and should not be used as the sole basis for treatment or other patient management decisions. A negative result may occur with  improper specimen collection/handling, submission of specimen other than nasopharyngeal swab, presence of viral mutation(s) within the areas targeted by this assay, and inadequate number of viral copies(<138 copies/mL). A negative result must be combined with clinical observations, patient history, and epidemiological information. The expected result is Negative.  Fact Sheet for Patients:  BloggerCourse.com  Fact Sheet for Healthcare Providers:  SeriousBroker.it  This test  is no                          t yet approved or cleared by the [place] FDA and  has been authorized for detection and/or diagnosis of SARS-CoV-2 by FDA under an Emergency Use Authorization (EUA). This EUA will remain  in effect (meaning this test can be used) for the duration of the COVID-19 declaration under Section 564(b)(1) of the Act, 21 U.S.C.section 360bbb-3(b)(1), unless the authorization is terminated  or revoked sooner.       Influenza A by PCR 07/25/2022 NEGATIVE  NEGATIVE Final   Influenza B by PCR 07/25/2022 NEGATIVE  NEGATIVE Final   Comment: (NOTE) The Xpert Xpress SARS-CoV-2/FLU/RSV plus assay is intended as an aid in the diagnosis of influenza from Nasopharyngeal swab specimens and should not be used as a sole basis for treatment. Nasal washings and aspirates are unacceptable for Xpert Xpress SARS-CoV-2/FLU/RSV testing.  Fact Sheet for Patients: BloggerCourse.com  Fact Sheet for Healthcare Providers: SeriousBroker.it  This test is not yet approved or cleared by the [place] FDA and has been authorized for detection and/or diagnosis of SARS-CoV-2 by FDA under an Emergency Use Authorization (EUA). This EUA will remain in effect (meaning this test can be used) for the duration of the COVID-19 declaration under Section 564(b)(1) of the Act, 21 U.S.C. section 360bbb-3(b)(1), unless the authorization is terminated or revoked.  Performed at [facility], [address] [place], [place] [address]    WBC 07/25/2022 8.3  4.0 - 10.5 K/uL Final   RBC 07/25/2022 4.44  3.87 - 5.11 MIL/uL Final   Hemoglobin 07/25/2022 13.7  12.0 - 15.0 g/dL Final   HCT 70/17/7939 40.2  36.0 - 46.0 % Final   MCV 07/25/2022 90.5  80.0 - 100.0 fL Final   MCH 07/25/2022 30.9  26.0 - 34.0 pg Final   MCHC 07/25/2022 34.1  30.0 - 36.0 g/dL Final   RDW 03/00/9233 12.2  11.5 - 15.5 % Final   Platelets 07/25/2022 248  150 - 400  K/uL Final   nRBC 07/25/2022 0.0  0.0 - 0.2 % Final   Neutrophils Relative % 07/25/2022 43  % Final   Neutro Abs 07/25/2022 3.6  1.7 - 7.7 K/uL Final   Lymphocytes Relative 07/25/2022 52  % Final   Lymphs Abs 07/25/2022 4.2 (H)  0.7 - 4.0 K/uL Final   Monocytes Relative 07/25/2022 5  % Final   Monocytes Absolute 07/25/2022 0.4  0.1 - 1.0 K/uL Final   Eosinophils Relative 07/25/2022 0  % Final   Eosinophils Absolute 07/25/2022 0.0  0.0 - 0.5 K/uL Final   Basophils Relative 07/25/2022 0  % Final   Basophils Absolute 07/25/2022 0.0  0.0 - 0.1 K/uL Final   Immature Granulocytes 07/25/2022 0  % Final   Abs Immature Granulocytes 07/25/2022 0.02  0.00 - 0.07 K/uL Final   Performed at [facility], [address], [place], [place] [address]   Sodium 07/25/2022 138  135 - 145 mmol/L Final   Potassium 07/25/2022 4.0  3.5 - 5.1 mmol/L Final   Chloride 07/25/2022 104  98 - 111 mmol/L Final   CO2 07/25/2022 29  22 - 32 mmol/L Final   Glucose, Bld 07/25/2022 83  70 - 99 mg/dL Final   Glucose reference range applies only to samples taken after fasting for at least 8 hours.   BUN 07/25/2022 11  6 - 20 mg/dL Final   Creatinine, Ser 07/25/2022 0.97  0.44 - 1.00 mg/dL Final   Calcium 60/45/4098 9.2  8.9 - 10.3 mg/dL Final   Total Protein 11/91/4782 7.0  6.5 - 8.1 g/dL Final   Albumin 95/62/1308 3.8  3.5 - 5.0 g/dL Final   AST 65/78/4696 18  15 - 41 U/L Final   ALT 07/25/2022 22  0 - 44 U/L Final   Alkaline Phosphatase 07/25/2022 64  38 - 126 U/L Final   Total Bilirubin 07/25/2022 0.2 (L)  0.3 - 1.2 mg/dL Final   GFR, Estimated 07/25/2022 >60  >60 mL/min Final   Comment: (NOTE) Calculated using the CKD-EPI Creatinine Equation (2021)    Anion gap 07/25/2022 5  5 - 15 Final   Performed at [facility], [address] [place], [place] [address]   POC Amphetamine UR 07/25/2022 None Detected  NONE DETECTED (Cut Off Level 1000 ng/mL) Preliminary   POC Secobarbital (BAR) 07/25/2022 None  Detected  NONE DETECTED (Cut Off Level 300 ng/mL) Preliminary   POC Buprenorphine (BUP) 07/25/2022 None Detected  NONE DETECTED (Cut Off Level 10 ng/mL) Preliminary   POC Oxazepam (BZO) 07/25/2022 None Detected  NONE DETECTED (Cut Off Level 300 ng/mL) Preliminary   POC Cocaine UR 07/25/2022 None Detected  NONE DETECTED (Cut Off Level 300 ng/mL) Preliminary   POC Methamphetamine UR 07/25/2022 None Detected  NONE DETECTED (Cut Off Level 1000 ng/mL) Preliminary   POC Morphine 07/25/2022 None Detected  NONE DETECTED (Cut Off Level 300 ng/mL) Preliminary   POC Methadone UR 07/25/2022 None Detected  NONE DETECTED (Cut Off Level 300 ng/mL) Preliminary   POC Oxycodone UR 07/25/2022 None Detected  NONE DETECTED (Cut Off Level 100 ng/mL) Preliminary   POC Marijuana UR 07/25/2022 None Detected  NONE DETECTED (Cut Off Level 50 ng/mL) Preliminary   SARSCOV2ONAVIRUS 2 AG 07/25/2022 NEGATIVE  NEGATIVE Final   Comment: (NOTE) SARS-CoV-2 antigen NOT DETECTED.   Negative results are presumptive.  Negative results do not preclude SARS-CoV-2 infection and should not be used as the sole basis for treatment or  other patient management decisions, including infection  control decisions, particularly in the presence of clinical signs and  symptoms consistent with COVID-19, or in those who have been in contact with the virus.  Negative results must be combined with clinical observations, patient history, and epidemiological information. The expected result is Negative.  Fact Sheet for Patients: https://www.jennings-kim.com/https://www.fda.gov/media/141569/download  Fact Sheet for Healthcare Providers: https://alexander-rogers.biz/https://www.fda.gov/media/141568/download  This test is not yet approved or cleared by the [place] FDA and  has been authorized for detection and/or diagnosis of SARS-CoV-2 by FDA under an Emergency Use Authorization (EUA).  This EUA will remain in effect (meaning this test can be used) for the duration of  the COV                           ID-19 declaration under Section 564(b)(1) of the Act, 21 U.S.C. section 360bbb-3(b)(1), unless the authorization is terminated or revoked sooner.     Cholesterol 07/25/2022 181  0 - 200 mg/dL Final   Triglycerides 16/10/960410/09/2022 118  <150 mg/dL Final   HDL 54/09/811910/09/2022 45  >40 mg/dL Final   Total CHOL/HDL Ratio 07/25/2022 4.0  RATIO Final   VLDL 07/25/2022 24  0 - 40 mg/dL Final   LDL Cholesterol 07/25/2022 112 (H)  0 - 99 mg/dL Final   Comment:        Total Cholesterol/HDL:CHD Risk Coronary Heart Disease Risk Table                     Men   Women  1/2 Average Risk   3.4   3.3  Average Risk       5.0   4.4  2 X Average Risk   9.6   7.1  3 X Average Risk  23.4   11.0        Use the calculated Patient Ratio above and the CHD Risk Table to determine the patient's CHD Risk.        ATP III CLASSIFICATION (LDL):  <100     mg/dL   Optimal  [id number]  mg/dL   Near or Above                    Optimal  130-159  mg/dL   Borderline  562-130160-189  mg/dL   High  >865>190     mg/dL   Very High Performed at [facility], [address] [place], [place] [address]    TSH 07/25/2022 6.668 (H)  0.350 - 4.500 uIU/mL Final   Comment: Performed by a 3rd Generation assay with a functional sensitivity of <=0.01 uIU/mL. Performed at [facility], [address] [place], [place] [address]    Glucose-Capillary 07/26/2022 104 (H)  70 - 99 mg/dL Final   Glucose reference range applies only to samples taken after fasting for at least 8 hours.   T3, Free 07/31/2022 2.3  2.0 - 4.4 pg/mL Final   Comment: (NOTE) Performed At: [facility] [address] [place], [place] [id number] Jolene SchimkeNagendra Sanjai MD UU:[id number]:[phone]    Free T4 07/31/2022 0.60 (L)  0.61 - 1.12 ng/dL Final   Comment: (NOTE) Biotin ingestion may interfere with free T4 tests. If the results are inconsistent with the TSH level, previous test results, or the clinical presentation, then consider biotin interference. If needed, order  repeat testing after stopping biotin. Performed at [facility], [address] [place], [place] [address]  Admission on 07/04/2022, Discharged on 07/05/2022  Component Date Value Ref Range Status   Sodium 07/04/2022 142  135 - 145 mmol/L Final   Potassium 07/04/2022 4.4  3.5 - 5.1 mmol/L Final   Chloride 07/04/2022 109  98 - 111 mmol/L Final   CO2 07/04/2022 26  22 - 32 mmol/L Final   Glucose, Bld 07/04/2022 96  70 - 99 mg/dL Final   Glucose reference range applies only to samples taken after fasting for at least 8 hours.   BUN 07/04/2022 15  6 - 20 mg/dL Final   Creatinine, Ser 07/04/2022 0.83  0.44 - 1.00 mg/dL Final   Calcium 16/07/9603 9.3  8.9 - 10.3 mg/dL Final   Total Protein 54/06/8118 7.2  6.5 - 8.1 g/dL Final   Albumin 14/78/2956 3.9  3.5 - 5.0 g/dL Final   AST 21/30/8657 23  15 - 41 U/L Final   ALT 07/04/2022 28  0 - 44 U/L Final   Alkaline Phosphatase 07/04/2022 77  38 - 126 U/L Final   Total Bilirubin 07/04/2022 0.4  0.3 - 1.2 mg/dL Final   GFR, Estimated 07/04/2022 >60  >60 mL/min Final   Comment: (NOTE) Calculated using the CKD-EPI Creatinine Equation (2021)    Anion gap 07/04/2022 7  5 - 15 Final   Performed at [facility], [address] [place], [place] [address]   Alcohol, Ethyl (B) 07/04/2022 <10  <10 mg/dL Final   Comment: (NOTE) Lowest detectable limit for serum alcohol is 10 mg/dL.  For medical purposes only. Performed at [facility], [address], [place], [place] [address]    WBC 07/04/2022 7.0  4.0 - 10.5 K/uL Final   RBC 07/04/2022 4.18  3.87 - 5.11 MIL/uL Final   Hemoglobin 07/04/2022 12.8  12.0 - 15.0 g/dL Final   HCT 41/32/4401 39.1  36.0 - 46.0 % Final   MCV 07/04/2022 93.5  80.0 - 100.0 fL Final   MCH 07/04/2022 30.6  26.0 - 34.0 pg Final   MCHC 07/04/2022 32.7  30.0 - 36.0 g/dL Final   RDW 02/72/5366 12.9  11.5 - 15.5 % Final   Platelets 07/04/2022 243  150 - 400 K/uL Final   nRBC  07/04/2022 0.0  0.0 - 0.2 % Final   Neutrophils Relative % 07/04/2022 43  % Final   Neutro Abs 07/04/2022 3.0  1.7 - 7.7 K/uL Final   Lymphocytes Relative 07/04/2022 50  % Final   Lymphs Abs 07/04/2022 3.5  0.7 - 4.0 K/uL Final   Monocytes Relative 07/04/2022 7  % Final   Monocytes Absolute 07/04/2022 0.5  0.1 - 1.0 K/uL Final   Eosinophils Relative 07/04/2022 0  % Final   Eosinophils Absolute 07/04/2022 0.0  0.0 - 0.5 K/uL Final   Basophils Relative 07/04/2022 0  % Final   Basophils Absolute 07/04/2022 0.0  0.0 - 0.1 K/uL Final   Immature Granulocytes 07/04/2022 0  % Final   Abs Immature Granulocytes 07/04/2022 0.01  0.00 - 0.07 K/uL Final   Performed at [facility], [address] [place], [place] [address]   I-stat hCG, quantitative 07/04/2022 <5.0  <5 mIU/mL Final   Comment 3 07/04/2022          Final   Comment:   GEST. AGE      CONC.  (mIU/mL)   <=1 WEEK        5 - 50     2 WEEKS  50 - 500     3 WEEKS       100 - 10,000     4 WEEKS     1,000 - 30,000        FEMALE AND NON-PREGNANT FEMALE:     LESS THAN 5 mIU/mL   Admission on 06/14/2022, Discharged on 06/14/2022  Component Date Value Ref Range Status   Color, UA 06/14/2022 yellow  yellow Final   Clarity, UA 06/14/2022 cloudy (A)  clear Final   Glucose, UA 06/14/2022 negative  negative mg/dL Final   Bilirubin, UA 24/26/8341 negative  negative Final   Ketones, POC UA 06/14/2022 negative  negative mg/dL Final   Spec Grav, UA 96/22/2979 1.025  1.010 - 1.025 Final   Blood, UA 06/14/2022 negative  negative Final   pH, UA 06/14/2022 7.5  5.0 - 8.0 Final   Protein Ur, POC 06/14/2022 =30 (A)  negative mg/dL Final   Urobilinogen, UA 06/14/2022 0.2  0.2 or 1.0 E.U./dL Final   Nitrite, UA 89/[birth year]1941 Negative  Negative Final   Leukocytes, UA 06/14/2022 Negative  Negative Final   Preg Test, Ur 06/14/2022 Negative  Negative Final   Specimen Description 06/14/2022 URINE, CLEAN CATCH   Final   Special Requests  06/14/2022    Final                   Value:NONE Performed at [facility], [address], [place], [place] [address]    Culture 06/14/2022 MULTIPLE SPECIES PRESENT, SUGGEST RECOLLECTION (A)   Final   Report Status 06/14/2022 06/16/2022 FINAL   Final  Admission on 06/07/2022, Discharged on 06/10/2022  Component Date Value Ref Range Status   SARS Coronavirus 2 by RT PCR 06/07/2022 NEGATIVE  NEGATIVE Final   Comment: (NOTE) SARS-CoV-2 target nucleic acids are NOT DETECTED.  The SARS-CoV-2 RNA is generally detectable in upper respiratory specimens during the acute phase of infection. The lowest concentration of SARS-CoV-2 viral copies this assay can detect is 138 copies/mL. A negative result does not preclude SARS-Cov-2 infection and should not be used as the sole basis for treatment or other patient management decisions. A negative result may occur with  improper specimen collection/handling, submission of specimen other than nasopharyngeal swab, presence of viral mutation(s) within the areas targeted by this assay, and inadequate number of viral copies(<138 copies/mL). A negative result must be combined with clinical observations, patient history, and epidemiological information. The expected result is Negative.  Fact Sheet for Patients:  BloggerCourse.com  Fact Sheet for Healthcare Providers:  SeriousBroker.it  This test is no                          t yet approved or cleared by the [place] FDA and  has been authorized for detection and/or diagnosis of SARS-CoV-2 by FDA under an Emergency Use Authorization (EUA). This EUA will remain  in effect (meaning this test can be used) for the duration of the COVID-19 declaration under Section 564(b)(1) of the Act, 21 U.S.C.section 360bbb-3(b)(1), unless the authorization is terminated  or revoked sooner.       Influenza A by PCR 06/07/2022 NEGATIVE  NEGATIVE Final    Influenza B by PCR 06/07/2022 NEGATIVE  NEGATIVE Final   Comment: (NOTE) The Xpert Xpress SARS-CoV-2/FLU/RSV plus assay is intended as an aid in the diagnosis of influenza from Nasopharyngeal swab specimens and should not be used as a sole basis for treatment. Nasal washings and aspirates  are unacceptable for Xpert Xpress SARS-CoV-2/FLU/RSV testing.  Fact Sheet for Patients: BloggerCourse.com  Fact Sheet for Healthcare Providers: SeriousBroker.it  This test is not yet approved or cleared by the [place] FDA and has been authorized for detection and/or diagnosis of SARS-CoV-2 by FDA under an Emergency Use Authorization (EUA). This EUA will remain in effect (meaning this test can be used) for the duration of the COVID-19 declaration under Section 564(b)(1) of the Act, 21 U.S.C. section 360bbb-3(b)(1), unless the authorization is terminated or revoked.  Performed at [facility], [address] [place], [place] [address]    WBC 06/07/2022 5.7  4.0 - 10.5 K/uL Final   RBC 06/07/2022 4.39  3.87 - 5.11 MIL/uL Final   Hemoglobin 06/07/2022 13.2  12.0 - 15.0 g/dL Final   HCT 60/45/4098 40.4  36.0 - 46.0 % Final   MCV 06/07/2022 92.0  80.0 - 100.0 fL Final   MCH 06/07/2022 30.1  26.0 - 34.0 pg Final   MCHC 06/07/2022 32.7  30.0 - 36.0 g/dL Final   RDW 11/91/4782 12.4  11.5 - 15.5 % Final   Platelets 06/07/2022 308  150 - 400 K/uL Final   nRBC 06/07/2022 0.0  0.0 - 0.2 % Final   Neutrophils Relative % 06/07/2022 42  % Final   Neutro Abs 06/07/2022 2.4  1.7 - 7.7 K/uL Final   Lymphocytes Relative 06/07/2022 54  % Final   Lymphs Abs 06/07/2022 3.1  0.7 - 4.0 K/uL Final   Monocytes Relative 06/07/2022 4  % Final   Monocytes Absolute 06/07/2022 0.2  0.1 - 1.0 K/uL Final   Eosinophils Relative 06/07/2022 0  % Final   Eosinophils Absolute 06/07/2022 0.0  0.0 - 0.5 K/uL Final   Basophils Relative 06/07/2022 0  % Final   Basophils  Absolute 06/07/2022 0.0  0.0 - 0.1 K/uL Final   Immature Granulocytes 06/07/2022 0  % Final   Abs Immature Granulocytes 06/07/2022 0.01  0.00 - 0.07 K/uL Final   Performed at [facility], [address] [place], [place] [address]   Sodium 06/07/2022 139  135 - 145 mmol/L Final   Potassium 06/07/2022 4.0  3.5 - 5.1 mmol/L Final   Chloride 06/07/2022 104  98 - 111 mmol/L Final   CO2 06/07/2022 28  22 - 32 mmol/L Final   Glucose, Bld 06/07/2022 104 (H)  70 - 99 mg/dL Final   Glucose reference range applies only to samples taken after fasting for at least 8 hours.   BUN 06/07/2022 8  6 - 20 mg/dL Final   Creatinine, Ser 06/07/2022 0.84  0.44 - 1.00 mg/dL Final   Calcium 30/86/5784 9.1  8.9 - 10.3 mg/dL Final   Total Protein 69/62/9528 6.9  6.5 - 8.1 g/dL Final   Albumin 41/32/4401 3.7  3.5 - 5.0 g/dL Final   AST 02/72/5366 19  15 - 41 U/L Final   ALT 06/07/2022 22  0 - 44 U/L Final   Alkaline Phosphatase 06/07/2022 54  38 - 126 U/L Final   Total Bilirubin 06/07/2022 0.4  0.3 - 1.2 mg/dL Final   GFR, Estimated 06/07/2022 >60  >60 mL/min Final   Comment: (NOTE) Calculated using the CKD-EPI Creatinine Equation (2021)    Anion gap 06/07/2022 7  5 - 15 Final   Performed at [facility], [address], [place], [place] [address]   Hgb A1c MFr Bld 06/07/2022 5.0  4.8 - 5.6 % Final   Comment: (NOTE)  Pre diabetes:          5.7%-6.4%  Diabetes:              >6.4%  Glycemic control for   <7.0% adults with diabetes    Mean Plasma Glucose 06/07/2022 96.8  mg/dL Final   Performed at [facility], [address], [place], [place] [address]   TSH 06/07/2022 1.620  0.350 - 4.500 uIU/mL Final   Comment: Performed by a 3rd Generation assay with a functional sensitivity of <=0.01 uIU/mL. Performed at [facility], [address], [place], [place] [address]    RPR Ser Ql 06/07/2022 NON REACTIVE  NON REACTIVE Final   Performed at [facility], [address]  [place], [place] [address]   Color, Urine 06/07/2022 YELLOW  YELLOW Final   APPearance 06/07/2022 HAZY (A)  CLEAR Final   Specific Gravity, Urine 06/07/2022 1.018  1.005 - 1.030 Final   pH 06/07/2022 7.0  5.0 - 8.0 Final   Glucose, UA 06/07/2022 NEGATIVE  NEGATIVE mg/dL Final   Hgb urine dipstick 06/07/2022 NEGATIVE  NEGATIVE Final   Bilirubin Urine 06/07/2022 NEGATIVE  NEGATIVE Final   Ketones, ur 06/07/2022 NEGATIVE  NEGATIVE mg/dL Final   Protein, ur 79/89/2119 NEGATIVE  NEGATIVE mg/dL Final   Nitrite 41/74/0814 NEGATIVE  NEGATIVE Final   Leukocytes,Ua 06/07/2022 NEGATIVE  NEGATIVE Final   Performed at [facility], [address], [place], [place] [address]   Cholesterol 06/07/2022 164  0 - 200 mg/dL Final   Triglycerides 63/14/9702 158 (H)  <150 mg/dL Final   HDL 63/78/5885 44  >40 mg/dL Final   Total CHOL/HDL Ratio 06/07/2022 3.7  RATIO Final   VLDL 06/07/2022 32  0 - 40 mg/dL Final   LDL Cholesterol 06/07/2022 88  0 - 99 mg/dL Final   Comment:        Total Cholesterol/HDL:CHD Risk Coronary Heart Disease Risk Table                     Men   Women  1/2 Average Risk   3.4   3.3  Average Risk       5.0   4.4  2 X Average Risk   9.6   7.1  3 X Average Risk  23.4   11.0        Use the calculated Patient Ratio above and the CHD Risk Table to determine the patient's CHD Risk.        ATP III CLASSIFICATION (LDL):  <100     mg/dL   Optimal  027-741  mg/dL   Near or Above                    Optimal  130-159  mg/dL   Borderline  287-867  mg/dL   High  >672     mg/dL   Very High Performed at [facility], [address] [place], [place] [address]    HIV Screen 4th Generation wRfx 06/07/2022 Non Reactive  Non Reactive Final   Performed at [facility], [address], [place], [place] [address]   SARSCOV2ONAVIRUS 2 AG 06/07/2022 NEGATIVE  NEGATIVE Final   Comment: (NOTE) SARS-CoV-2 antigen NOT DETECTED.   Negative results are presumptive.  Negative results do not  preclude SARS-CoV-2 infection and should not be used as the sole basis for treatment or other patient management decisions, including infection  control decisions, particularly in  the presence of clinical signs and  symptoms consistent with COVID-19, or in those who have been in contact with the virus.  Negative results must be combined with clinical observations, patient history, and epidemiological information. The expected result is Negative.  Fact Sheet for Patients: https://www.jennings-kim.com/  Fact Sheet for Healthcare Providers: https://alexander-rogers.biz/  This test is not yet approved or cleared by the [place] FDA and  has been authorized for detection and/or diagnosis of SARS-CoV-2 by FDA under an Emergency Use Authorization (EUA).  This EUA will remain in effect (meaning this test can be used) for the duration of  the COV                          ID-19 declaration under Section 564(b)(1) of the Act, 21 U.S.C. section 360bbb-3(b)(1), unless the authorization is terminated or revoked sooner.     POC Amphetamine UR 06/07/2022 None Detected  NONE DETECTED (Cut Off Level 1000 ng/mL) Final   POC Secobarbital (BAR) 06/07/2022 None Detected  NONE DETECTED (Cut Off Level 300 ng/mL) Final   POC Buprenorphine (BUP) 06/07/2022 None Detected  NONE DETECTED (Cut Off Level 10 ng/mL) Final   POC Oxazepam (BZO) 06/07/2022 None Detected  NONE DETECTED (Cut Off Level 300 ng/mL) Final   POC Cocaine UR 06/07/2022 None Detected  NONE DETECTED (Cut Off Level 300 ng/mL) Final   POC Methamphetamine UR 06/07/2022 None Detected  NONE DETECTED (Cut Off Level 1000 ng/mL) Final   POC Morphine 06/07/2022 None Detected  NONE DETECTED (Cut Off Level 300 ng/mL) Final   POC Methadone UR 06/07/2022 None Detected  NONE DETECTED (Cut Off Level 300 ng/mL) Final   POC Oxycodone UR 06/07/2022 None Detected  NONE DETECTED (Cut Off Level 100 ng/mL) Final   POC Marijuana UR  06/07/2022 None Detected  NONE DETECTED (Cut Off Level 50 ng/mL) Final   Preg Test, Ur 06/08/2022 NEGATIVE  NEGATIVE Final   Comment:        THE SENSITIVITY OF THIS METHODOLOGY IS >20 mIU/mL. Performed at [facility], [address], [place], [place] [address]    Preg Test, Ur 06/08/2022 NEGATIVE  NEGATIVE Final   Comment:        THE SENSITIVITY OF THIS METHODOLOGY IS >24 mIU/mL     Blood Alcohol level:  Lab Results  Component Value Date   ETH <10 07/04/2022   ETH <10 02/07/2022    Metabolic Disorder Labs: Lab Results  Component Value Date   HGBA1C 5.0 06/07/2022   MPG 96.8 06/07/2022   MPG 96.8 02/07/2022   No results found for: "PROLACTIN" Lab Results  Component Value Date   CHOL 181 07/25/2022   TRIG 118 07/25/2022   HDL 45 07/25/2022   CHOLHDL 4.0 07/25/2022   VLDL 24 07/25/2022   LDLCALC 112 (H) 07/25/2022   LDLCALC 88 06/07/2022    Therapeutic Lab Levels: No results found for: "LITHIUM" No results found for: "VALPROATE" No results found for: "CBMZ"  Physical Findings   GAD-7    Flowsheet Row Office Visit [facility] 12/17/2021 [facility]  Total GAD-7 Score 17      PHQ2-9    Flowsheet Row ED from 06/07/2022 in [facility] Office Visit from 12/17/2021 in [facility]  PHQ-2 Total Score 1 2  PHQ-9 Total Score 2 8      Flowsheet Row ED from 07/25/2022 in [facility] ED from 07/04/2022 in [facility] from 06/14/2022 in [facility]   C-SSRS RISK CATEGORY High Risk No Risk No Risk        Musculoskeletal  Strength & Muscle Tone: within normal limits Gait & Station: normal Patient leans: N/A  Psychiatric Specialty Exam  Presentation  General Appearance:  Appropriate for Environment  Eye Contact: Good  Speech: Clear and Coherent; Normal Rate  Speech  Volume: Normal  Handedness: Right   Mood and Affect  Mood: Euthymic  Affect: Appropriate; Congruent   Thought Process  Thought Processes: Coherent  Descriptions of Associations:Intact  Orientation:Full (Time, Place and Person)  Thought Content:Logical  Diagnosis of Schizophrenia or Schizoaffective disorder in past: No    Hallucinations:Hallucinations: None  Ideas of Reference:None  Suicidal Thoughts:Suicidal Thoughts: No  Homicidal Thoughts:Homicidal Thoughts: No   Sensorium  Memory: Immediate Fair  Judgment: Intact  Insight: Present   Executive Functions  Concentration: Fair  Attention Span: Fair  Recall: [employer] of Knowledge: Fair  Language: Fair   Psychomotor Activity  Psychomotor Activity: Psychomotor Activity: Normal   Assets  Assets: Communication Skills; Desire for Improvement   Sleep  Sleep: Sleep: Good   No data recorded  Physical Exam  Physical Exam Constitutional:      General: She is not in acute distress.    Appearance: She is not ill-appearing, toxic-appearing or diaphoretic.  Eyes:     General: No scleral icterus. Pulmonary:     Effort: Pulmonary effort is normal. No respiratory distress.  Neurological:     Mental Status: She is alert and oriented to person, place, and time.  Psychiatric:        Attention and Perception: Attention and perception normal.        Mood and Affect: Mood and affect normal.        Speech: Speech normal.        Behavior: Behavior normal. Behavior is cooperative.        Thought Content: Thought content normal.    Review of Systems  Constitutional:  Negative for chills and fever.  Respiratory:  Negative for shortness of breath.   Cardiovascular:  Negative for chest pain and palpitations.  Gastrointestinal:  Negative for abdominal pain.  Neurological:  Negative for headaches.   Blood pressure 108/82, pulse (!) 104, temperature 98.2 F (36.8 C), temperature source Oral,  resp. rate 18, SpO2 100 %. There is no height or weight on file to calculate BMI.  Treatment Plan Summary: Plan Disposition is pending placement  Lauree Chandler, NP 08/21/2022 8:29 AM

## 2022-08-21 NOTE — ED Notes (Signed)
Pt sleeping at present, no distress noted.  Monitoring for safety. 

## 2022-08-21 NOTE — ED Notes (Signed)
Patient resting with no sxs of distress noted - will continue to monitor for safety 

## 2022-08-22 MED ORDER — ZIPRASIDONE MESYLATE 20 MG IM SOLR
20.0000 mg | Freq: Two times a day (BID) | INTRAMUSCULAR | Status: DC | PRN
  Administered 2022-09-13 – 2022-09-19 (×2): 20 mg via INTRAMUSCULAR
  Filled 2022-08-22 (×2): qty 20

## 2022-08-22 MED ORDER — LORAZEPAM 1 MG PO TABS
1.0000 mg | ORAL_TABLET | ORAL | Status: AC | PRN
  Administered 2022-09-10: 1 mg via ORAL
  Filled 2022-08-22: qty 1

## 2022-08-22 NOTE — ED Notes (Signed)
Patient frustrated today regarding discharge and she feels she is not told anything, she is tired of being here. Denies SI/HI/AVH. Verbally redirected patient and verbal encouragement provided.

## 2022-08-22 NOTE — ED Notes (Signed)
Patient resting at this time with no sxs of distress noted - will continue to monitor for safety  

## 2022-08-22 NOTE — ED Notes (Signed)
Patient observed/assessed in bed/chair resting quietly appearing with no distress and verbalizing no complaints at this time. Will continue to monitor.  

## 2022-08-22 NOTE — ED Notes (Signed)
Patient was yelling obscenities, walked to the bathroom and slammed the door.

## 2022-08-22 NOTE — ED Notes (Signed)
Pt accepted po medications without incident. Ambulates on unit without difficulty. Pt converses with staff and other patients. Currently resting on pull out chair. Safety maintained and will continue to monitor.

## 2022-08-22 NOTE — ED Notes (Signed)
Patient observed/assessed on unit sitting on edge of bed/chair. Alert and oriented x 4. Patient's affect is flat, eye contact minimal, and she states that she is annoyed today due to "people lying to her" regarding "getting out of here." She verbalizes frustration pertaining to not understanding why she is remaining within treatment. Distraction, verbal de-escalation, and active listening techniques utilized. Patient denied thoughts of self harm and harm towards others. She denies A/V/H. Appetite is good and she last had a BM 2 days ago. She states that she was given MOM this morning without success. Fluids were encouraged. Will continue to monitor and support.

## 2022-08-22 NOTE — ED Notes (Signed)
Snacks given 

## 2022-08-22 NOTE — Progress Notes (Signed)
Patient approached staff and reported that she was upset with some of the day shift staff because she felt that they had been "disrespectful" to her earlier in the day.  She said that she knows to come see staff when she is upset.  She received PRN anxiety medication PO and did some breathing exercises to calm down.  Patient thanked staff for assisting her through her episode of anxiety.

## 2022-08-22 NOTE — ED Provider Notes (Signed)
Behavioral Health Progress Note  Date and Time: 08/22/2022 10:35 AM Name: Nicole [name] MRN:  [id number]  Subjective:   Nicole [name] is a 31 y.o. female, with PMH of SCZ unspecified, MDD with psychosis, who presented voluntary to [facility] (07/25/2022) via [facility] after a verbal altercation with [name] (not patient's legal guardian) for transient SI.  This is patient's fourth visit to [facility] for similar concerns this year. Patient has been dismissed from previous group home due to threatening SI and HI, then leaving the group home.   Legal Guardian: [name]   On reassessment, pt is lying down. She verbalized she is hoping a placement is found soon. She denies SI/VI/HI, AVH, paranoia. She denies physical complaints. Pt remains psychiatrically cleared. Disposition is pending placement.   Diagnosis:  Final diagnoses:  Severe episode of recurrent major depressive disorder, with psychotic features (HCC)    Total Time spent with patient: 15 minutes  Past Psychiatric History: SCZ unspecified, MDD with psychosis, Anxiety Past Medical History:  Past Medical History:  Diagnosis Date   Anxiety    Depression    Hypothyroidism 08/07/2022   Tobacco use disorder 08/07/2022    Past Surgical History:  Procedure Laterality Date   WISDOM TOOTH EXTRACTION Bilateral 2020   Family History:  Family History  Problem Relation Age of Onset   Hypertension Father    Diabetes Father    Family Psychiatric  History: None reported Social History:  Social History   Substance and Sexual Activity  Alcohol Use Never     Social History   Substance and Sexual Activity  Drug Use Never    Social History   Socioeconomic History   Marital status: Single    Spouse name: Not on file   Number of children: Not on file   Years of education: Not on file   Highest education level: Not on file  Occupational History   Not  on file  Tobacco Use   Smoking status: Every Day    Types: Cigarettes   Smokeless tobacco: Never  Vaping Use   Vaping Use: Some days  Substance and Sexual Activity   Alcohol use: Never   Drug use: Never   Sexual activity: Yes    Partners: Female    Birth control/protection: Implant  Other Topics Concern   Not on file  Social History Narrative   Not on file   Social Determinants of Health   Financial Resource Strain: Not on file  Food Insecurity: Not on file  Transportation Needs: Not on file  Physical Activity: Not on file  Stress: Not on file  Social Connections: Not on file   SDOH:  SDOH Screenings   Depression (PHQ2-9): Low Risk  (06/10/2022)  Tobacco Use: High Risk (08/07/2022)   Additional Social History:    Pain Medications: SEE MAR Prescriptions: SEE MAR Over the Counter: SEE MAR History of alcohol / drug use?: Yes Longest period of sobriety (when/how long): N/A Negative Consequences of Use:  (NONE) Withdrawal Symptoms: None Name of Substance 1: ETOH IN PAST--PT CURRENTLY DENIES 1 - Frequency: SOCIAL USE ETOH IN PAST 1 - Method of Aquiring: LEGAL 1- Route of Use: ORAL DRINK Name of Substance 2: NICOTINE-VAPE AND CIGARETTES 2 - Amount (size/oz): LESS THAN ONE PACK 2 - Frequency: DAILY 2 - Method of Aquiring: LEGAL 2 - Route of Substance Use: ORAL SMOKE  Sleep: Good  Appetite:  Good  Current Medications:  Current Facility-Administered Medications  Medication Dose Route Frequency Provider Last Rate Last Admin   acetaminophen (TYLENOL) tablet 650 mg  650 mg Oral Q6H PRN Onuoha, Chinwendu V, NP   650 mg at 08/15/22 1820   alum & mag hydroxide-simeth (MAALOX/MYLANTA) 200-200-20 MG/5ML suspension 30 mL  30 mL Oral Q4H PRN Onuoha, Chinwendu V, NP   30 mL at 07/27/22 2151   ARIPiprazole ER (ABILIFY MAINTENA) injection 400 mg  400 mg Intramuscular Q28 days Princess Bruins, DO   400 mg at 08/02/22 1154   clotrimazole (LOTRIMIN) 1 % cream    Topical BID Bobbitt, Shalon E, NP   Given at 08/21/22 0927   hydrOXYzine (ATARAX) tablet 25 mg  25 mg Oral TID PRN Onuoha, Chinwendu V, NP   25 mg at 08/20/22 2111   ibuprofen (ADVIL) tablet 400 mg  400 mg Oral Q6H PRN Princess Bruins, DO   400 mg at 08/08/22 0854   levothyroxine (SYNTHROID) tablet 100 mcg  100 mcg Oral Q0600 Princess Bruins, DO   100 mcg at 08/22/22 1610   magnesium hydroxide (MILK OF MAGNESIA) suspension 30 mL  30 mL Oral Daily PRN Onuoha, Chinwendu V, NP   30 mL at 08/22/22 1005   nicotine (NICODERM CQ - dosed in mg/24 hours) patch 14 mg  14 mg Transdermal Q0600 Onuoha, Chinwendu V, NP   14 mg at 08/22/22 0947   ondansetron (ZOFRAN) tablet 4 mg  4 mg Oral Once Nwoko, Uchenna E, PA       Oxcarbazepine (TRILEPTAL) tablet 300 mg  300 mg Oral BID Onuoha, Chinwendu V, NP   300 mg at 08/22/22 0948   pantoprazole (PROTONIX) EC tablet 20 mg  20 mg Oral Daily Princess Bruins, DO   20 mg at 08/22/22 0947   QUEtiapine (SEROQUEL) tablet 400 mg  400 mg Oral BID Onuoha, Chinwendu V, NP   400 mg at 08/22/22 0948   senna-docusate (Senokot-S) tablet 1 tablet  1 tablet Oral QHS Princess Bruins, DO   1 tablet at 08/21/22 2140   sertraline (ZOLOFT) tablet 150 mg  150 mg Oral Daily Onuoha, Chinwendu V, NP   150 mg at 08/22/22 0948   traZODone (DESYREL) tablet 50 mg  50 mg Oral QHS PRN Onuoha, Chinwendu V, NP   50 mg at 08/21/22 2139   valACYclovir (VALTREX) tablet 500 mg  500 mg Oral Daily Princess Bruins, DO   500 mg at 08/22/22 9604   Current Outpatient Medications  Medication Sig Dispense Refill   ABILIFY MAINTENA 400 MG PRSY prefilled syringe 400 mg every 28 (twenty-eight) days.     cetirizine (ZYRTEC) 10 MG tablet Take 10 mg by mouth daily.     cyclobenzaprine (FLEXERIL) 10 MG tablet Take 1 tablet (10 mg total) by mouth 2 (two) times daily as needed for muscle spasms. 20 tablet 0   fluticasone (FLONASE) 50 MCG/ACT nasal spray Place 1 spray into both nostrils daily.     hydrOXYzine (ATARAX) 50 MG  tablet Take 1 tablet (50 mg total) by mouth 2 (two) times daily. 60 tablet 0   meloxicam (MOBIC) 15 MG tablet Take 15 mg by mouth daily.     Oxcarbazepine (TRILEPTAL) 300 MG tablet Take 1 tablet (300 mg total) by mouth 2 (two) times daily. 60 tablet 0   QUEtiapine (SEROQUEL) 400 MG tablet Take 1 tablet (400 mg total) by mouth 2 (two) times daily. 60 tablet 0   sertraline (ZOLOFT)  50 MG tablet Take 3 tablets (150 mg total) by mouth in the morning. 90 tablet 0   traZODone (DESYREL) 100 MG tablet Take 1 tablet (100 mg total) by mouth at bedtime. 30 tablet 0   valACYclovir (VALTREX) 500 MG tablet Take 500 mg by mouth daily.      Labs  Lab Results:  Admission on 07/25/2022  Component Date Value Ref Range Status   SARS Coronavirus 2 by RT PCR 07/25/2022 NEGATIVE  NEGATIVE Final   Comment: (NOTE) SARS-CoV-2 target nucleic acids are NOT DETECTED.  The SARS-CoV-2 RNA is generally detectable in upper respiratory specimens during the acute phase of infection. The lowest concentration of SARS-CoV-2 viral copies this assay can detect is 138 copies/mL. A negative result does not preclude SARS-Cov-2 infection and should not be used as the sole basis for treatment or other patient management decisions. A negative result may occur with  improper specimen collection/handling, submission of specimen other than nasopharyngeal swab, presence of viral mutation(s) within the areas targeted by this assay, and inadequate number of viral copies(<138 copies/mL). A negative result must be combined with clinical observations, patient history, and epidemiological information. The expected result is Negative.  Fact Sheet for Patients:  BloggerCourse.com  Fact Sheet for Healthcare Providers:  SeriousBroker.it  This test is no                          t yet approved or cleared by the [place] FDA and  has been authorized for detection and/or diagnosis of  SARS-CoV-2 by FDA under an Emergency Use Authorization (EUA). This EUA will remain  in effect (meaning this test can be used) for the duration of the COVID-19 declaration under Section 564(b)(1) of the Act, 21 U.S.C.section 360bbb-3(b)(1), unless the authorization is terminated  or revoked sooner.       Influenza A by PCR 07/25/2022 NEGATIVE  NEGATIVE Final   Influenza B by PCR 07/25/2022 NEGATIVE  NEGATIVE Final   Comment: (NOTE) The Xpert Xpress SARS-CoV-2/FLU/RSV plus assay is intended as an aid in the diagnosis of influenza from Nasopharyngeal swab specimens and should not be used as a sole basis for treatment. Nasal washings and aspirates are unacceptable for Xpert Xpress SARS-CoV-2/FLU/RSV testing.  Fact Sheet for Patients: BloggerCourse.com  Fact Sheet for Healthcare Providers: SeriousBroker.it  This test is not yet approved or cleared by the [place] FDA and has been authorized for detection and/or diagnosis of SARS-CoV-2 by FDA under an Emergency Use Authorization (EUA). This EUA will remain in effect (meaning this test can be used) for the duration of the COVID-19 declaration under Section 564(b)(1) of the Act, 21 U.S.C. section 360bbb-3(b)(1), unless the authorization is terminated or revoked.  Performed at [facility], [address] [place], [place] [address]    WBC 07/25/2022 8.3  4.0 - 10.5 K/uL Final   RBC 07/25/2022 4.44  3.87 - 5.11 MIL/uL Final   Hemoglobin 07/25/2022 13.7  12.0 - 15.0 g/dL Final   HCT 09/81/1914 40.2  36.0 - 46.0 % Final   MCV 07/25/2022 90.5  80.0 - 100.0 fL Final   MCH 07/25/2022 30.9  26.0 - 34.0 pg Final   MCHC 07/25/2022 34.1  30.0 - 36.0 g/dL Final   RDW 78/29/5621 12.2  11.5 - 15.5 % Final   Platelets 07/25/2022 248  150 - 400 K/uL Final   nRBC 07/25/2022 0.0  0.0 - 0.2 % Final   Neutrophils Relative % 07/25/2022  43  % Final   Neutro Abs 07/25/2022 3.6  1.7 - 7.7  K/uL Final   Lymphocytes Relative 07/25/2022 52  % Final   Lymphs Abs 07/25/2022 4.2 (H)  0.7 - 4.0 K/uL Final   Monocytes Relative 07/25/2022 5  % Final   Monocytes Absolute 07/25/2022 0.4  0.1 - 1.0 K/uL Final   Eosinophils Relative 07/25/2022 0  % Final   Eosinophils Absolute 07/25/2022 0.0  0.0 - 0.5 K/uL Final   Basophils Relative 07/25/2022 0  % Final   Basophils Absolute 07/25/2022 0.0  0.0 - 0.1 K/uL Final   Immature Granulocytes 07/25/2022 0  % Final   Abs Immature Granulocytes 07/25/2022 0.02  0.00 - 0.07 K/uL Final   Performed at [facility], [address], [place], [place] [address]   Sodium 07/25/2022 138  135 - 145 mmol/L Final   Potassium 07/25/2022 4.0  3.5 - 5.1 mmol/L Final   Chloride 07/25/2022 104  98 - 111 mmol/L Final   CO2 07/25/2022 29  22 - 32 mmol/L Final   Glucose, Bld 07/25/2022 83  70 - 99 mg/dL Final   Glucose reference range applies only to samples taken after fasting for at least 8 hours.   BUN 07/25/2022 11  6 - 20 mg/dL Final   Creatinine, Ser 07/25/2022 0.97  0.44 - 1.00 mg/dL Final   Calcium 29/93/7169 9.2  8.9 - 10.3 mg/dL Final   Total Protein 67/89/3810 7.0  6.5 - 8.1 g/dL Final   Albumin 17/51/0258 3.8  3.5 - 5.0 g/dL Final   AST 52/77/8242 18  15 - 41 U/L Final   ALT 07/25/2022 22  0 - 44 U/L Final   Alkaline Phosphatase 07/25/2022 64  38 - 126 U/L Final   Total Bilirubin 07/25/2022 0.2 (L)  0.3 - 1.2 mg/dL Final   GFR, Estimated 07/25/2022 >60  >60 mL/min Final   Comment: (NOTE) Calculated using the CKD-EPI Creatinine Equation (2021)    Anion gap 07/25/2022 5  5 - 15 Final   Performed at [facility], [address], [place], [place] [address]   POC Amphetamine UR 07/25/2022 None Detected  NONE DETECTED (Cut Off Level 1000 ng/mL) Preliminary   POC Secobarbital (BAR) 07/25/2022 None Detected  NONE DETECTED (Cut Off Level 300 ng/mL) Preliminary   POC Buprenorphine (BUP) 07/25/2022 None Detected  NONE DETECTED (Cut Off Level 10  ng/mL) Preliminary   POC Oxazepam (BZO) 07/25/2022 None Detected  NONE DETECTED (Cut Off Level 300 ng/mL) Preliminary   POC Cocaine UR 07/25/2022 None Detected  NONE DETECTED (Cut Off Level 300 ng/mL) Preliminary   POC Methamphetamine UR 07/25/2022 None Detected  NONE DETECTED (Cut Off Level 1000 ng/mL) Preliminary   POC Morphine 07/25/2022 None Detected  NONE DETECTED (Cut Off Level 300 ng/mL) Preliminary   POC Methadone UR 07/25/2022 None Detected  NONE DETECTED (Cut Off Level 300 ng/mL) Preliminary   POC Oxycodone UR 07/25/2022 None Detected  NONE DETECTED (Cut Off Level 100 ng/mL) Preliminary   POC Marijuana UR 07/25/2022 None Detected  NONE DETECTED (Cut Off Level 50 ng/mL) Preliminary   SARSCOV2ONAVIRUS 2 AG 07/25/2022 NEGATIVE  NEGATIVE Final   Comment: (NOTE) SARS-CoV-2 antigen NOT DETECTED.   Negative results are presumptive.  Negative results do not preclude SARS-CoV-2 infection and should not be used as the sole basis for treatment or other patient management decisions, including infection  control decisions, particularly in the presence of clinical signs and  symptoms consistent with COVID-19,  or in those who have been in contact with the virus.  Negative results must be combined with clinical observations, patient history, and epidemiological information. The expected result is Negative.  Fact Sheet for Patients: https://www.jennings-kim.com/  Fact Sheet for Healthcare Providers: https://alexander-rogers.biz/  This test is not yet approved or cleared by the [place] FDA and  has been authorized for detection and/or diagnosis of SARS-CoV-2 by FDA under an Emergency Use Authorization (EUA).  This EUA will remain in effect (meaning this test can be used) for the duration of  the COV                          ID-19 declaration under Section 564(b)(1) of the Act, 21 U.S.C. section 360bbb-3(b)(1), unless the authorization is terminated or revoked  sooner.     Cholesterol 07/25/2022 181  0 - 200 mg/dL Final   Triglycerides 60/73/7106 118  <150 mg/dL Final   HDL 26/94/8546 45  >40 mg/dL Final   Total CHOL/HDL Ratio 07/25/2022 4.0  RATIO Final   VLDL 07/25/2022 24  0 - 40 mg/dL Final   LDL Cholesterol 07/25/2022 112 (H)  0 - 99 mg/dL Final   Comment:        Total Cholesterol/HDL:CHD Risk Coronary Heart Disease Risk Table                     Men   Women  1/2 Average Risk   3.4   3.3  Average Risk       5.0   4.4  2 X Average Risk   9.6   7.1  3 X Average Risk  23.4   11.0        Use the calculated Patient Ratio above and the CHD Risk Table to determine the patient's CHD Risk.        ATP III CLASSIFICATION (LDL):  <100     mg/dL   Optimal  270-350  mg/dL   Near or Above                    Optimal  130-159  mg/dL   Borderline  093-818  mg/dL   High  >299     mg/dL   Very High Performed at [facility], [address] [place], [place] [address]    TSH 07/25/2022 6.668 (H)  0.350 - 4.500 uIU/mL Final   Comment: Performed by a 3rd Generation assay with a functional sensitivity of <=0.01 uIU/mL. Performed at [facility], [address] [place], [place] [address]    Glucose-Capillary 07/26/2022 104 (H)  70 - 99 mg/dL Final   Glucose reference range applies only to samples taken after fasting for at least 8 hours.   T3, Free 07/31/2022 2.3  2.0 - 4.4 pg/mL Final   Comment: (NOTE) Performed At: [facility] [address] [place], [place] [id number] Jolene Schimke MD [id number]    Free T4 07/31/2022 0.60 (L)  0.61 - 1.12 ng/dL Final   Comment: (NOTE) Biotin ingestion may interfere with free T4 tests. If the results are inconsistent with the TSH level, previous test results, or the clinical presentation, then consider biotin interference. If needed, order repeat testing after stopping biotin. Performed at [facility], [address], [place], [place] [address]   Admission on 07/04/2022,  Discharged on 07/05/2022  Component Date Value Ref Range Status   Sodium 07/04/2022 142  135 - 145  mmol/L Final   Potassium 07/04/2022 4.4  3.5 - 5.1 mmol/L Final   Chloride 07/04/2022 109  98 - 111 mmol/L Final   CO2 07/04/2022 26  22 - 32 mmol/L Final   Glucose, Bld 07/04/2022 96  70 - 99 mg/dL Final   Glucose reference range applies only to samples taken after fasting for at least 8 hours.   BUN 07/04/2022 15  6 - 20 mg/dL Final   Creatinine, Ser 07/04/2022 0.83  0.44 - 1.00 mg/dL Final   Calcium 94/70/9628 9.3  8.9 - 10.3 mg/dL Final   Total Protein 36/62/9476 7.2  6.5 - 8.1 g/dL Final   Albumin 54/65/0354 3.9  3.5 - 5.0 g/dL Final   AST 65/68/1275 23  15 - 41 U/L Final   ALT 07/04/2022 28  0 - 44 U/L Final   Alkaline Phosphatase 07/04/2022 77  38 - 126 U/L Final   Total Bilirubin 07/04/2022 0.4  0.3 - 1.2 mg/dL Final   GFR, Estimated 07/04/2022 >60  >60 mL/min Final   Comment: (NOTE) Calculated using the CKD-EPI Creatinine Equation (2021)    Anion gap 07/04/2022 7  5 - 15 Final   Performed at [facility], [address] [place], [place] [address]   Alcohol, Ethyl (B) 07/04/2022 <10  <10 mg/dL Final   Comment: (NOTE) Lowest detectable limit for serum alcohol is 10 mg/dL.  For medical purposes only. Performed at [facility], [address], [place], [place] [address]    WBC 07/04/2022 7.0  4.0 - 10.5 K/uL Final   RBC 07/04/2022 4.18  3.87 - 5.11 MIL/uL Final   Hemoglobin 07/04/2022 12.8  12.0 - 15.0 g/dL Final   HCT 96/75/9163 39.1  36.0 - 46.0 % Final   MCV 07/04/2022 93.5  80.0 - 100.0 fL Final   MCH 07/04/2022 30.6  26.0 - 34.0 pg Final   MCHC 07/04/2022 32.7  30.0 - 36.0 g/dL Final   RDW 84/66/5993 12.9  11.5 - 15.5 % Final   Platelets 07/04/2022 243  150 - 400 K/uL Final   nRBC 07/04/2022 0.0  0.0 - 0.2 % Final   Neutrophils Relative % 07/04/2022 43  % Final   Neutro Abs 07/04/2022 3.0  1.7 - 7.7 K/uL Final   Lymphocytes  Relative 07/04/2022 50  % Final   Lymphs Abs 07/04/2022 3.5  0.7 - 4.0 K/uL Final   Monocytes Relative 07/04/2022 7  % Final   Monocytes Absolute 07/04/2022 0.5  0.1 - 1.0 K/uL Final   Eosinophils Relative 07/04/2022 0  % Final   Eosinophils Absolute 07/04/2022 0.0  0.0 - 0.5 K/uL Final   Basophils Relative 07/04/2022 0  % Final   Basophils Absolute 07/04/2022 0.0  0.0 - 0.1 K/uL Final   Immature Granulocytes 07/04/2022 0  % Final   Abs Immature Granulocytes 07/04/2022 0.01  0.00 - 0.07 K/uL Final   Performed at [facility], [address] [place], [place] [address]   I-stat hCG, quantitative 07/04/2022 <5.0  <5 mIU/mL Final   Comment 3 07/04/2022          Final   Comment:   GEST. AGE      CONC.  (mIU/mL)   <=1 WEEK        5 - 50     2 WEEKS       50 - 500     3 WEEKS       100 - 10,000  4 WEEKS     1,000 - 30,000        FEMALE AND NON-PREGNANT FEMALE:     LESS THAN 5 mIU/mL   Admission on 06/14/2022, Discharged on 06/14/2022  Component Date Value Ref Range Status   Color, UA 06/14/2022 yellow  yellow Final   Clarity, UA 06/14/2022 cloudy (A)  clear Final   Glucose, UA 06/14/2022 negative  negative mg/dL Final   Bilirubin, UA 16/07/9603 negative  negative Final   Ketones, POC UA 06/14/2022 negative  negative mg/dL Final   Spec Grav, UA 54/06/8118 1.025  1.010 - 1.025 Final   Blood, UA 06/14/2022 negative  negative Final   pH, UA 06/14/2022 7.5  5.0 - 8.0 Final   Protein Ur, POC 06/14/2022 =30 (A)  negative mg/dL Final   Urobilinogen, UA 06/14/2022 0.2  0.2 or 1.0 E.U./dL Final   Nitrite, UA 14/78/2956 Negative  Negative Final   Leukocytes, UA 06/14/2022 Negative  Negative Final   Preg Test, Ur 06/14/2022 Negative  Negative Final   Specimen Description 06/14/2022 URINE, CLEAN CATCH   Final   Special Requests 06/14/2022    Final                   Value:NONE Performed at [facility], [address], [place], [place] [address]    Culture 06/14/2022  MULTIPLE SPECIES PRESENT, SUGGEST RECOLLECTION (A)   Final   Report Status 06/14/2022 06/16/2022 FINAL   Final  Admission on 06/07/2022, Discharged on 06/10/2022  Component Date Value Ref Range Status   SARS Coronavirus 2 by RT PCR 06/07/2022 NEGATIVE  NEGATIVE Final   Comment: (NOTE) SARS-CoV-2 target nucleic acids are NOT DETECTED.  The SARS-CoV-2 RNA is generally detectable in upper respiratory specimens during the acute phase of infection. The lowest concentration of SARS-CoV-2 viral copies this assay can detect is 138 copies/mL. A negative result does not preclude SARS-Cov-2 infection and should not be used as the sole basis for treatment or other patient management decisions. A negative result may occur with  improper specimen collection/handling, submission of specimen other than nasopharyngeal swab, presence of viral mutation(s) within the areas targeted by this assay, and inadequate number of viral copies(<138 copies/mL). A negative result must be combined with clinical observations, patient history, and epidemiological information. The expected result is Negative.  Fact Sheet for Patients:  BloggerCourse.com  Fact Sheet for Healthcare Providers:  SeriousBroker.it  This test is no                          t yet approved or cleared by the [place] FDA and  has been authorized for detection and/or diagnosis of SARS-CoV-2 by FDA under an Emergency Use Authorization (EUA). This EUA will remain  in effect (meaning this test can be used) for the duration of the COVID-19 declaration under Section 564(b)(1) of the Act, 21 U.S.C.section 360bbb-3(b)(1), unless the authorization is terminated  or revoked sooner.       Influenza A by PCR 06/07/2022 NEGATIVE  NEGATIVE Final   Influenza B by PCR 06/07/2022 NEGATIVE  NEGATIVE Final   Comment: (NOTE) The Xpert Xpress SARS-CoV-2/FLU/RSV plus assay is intended as an aid in the  diagnosis of influenza from Nasopharyngeal swab specimens and should not be used as a sole basis for treatment. Nasal washings and aspirates are unacceptable for Xpert Xpress SARS-CoV-2/FLU/RSV testing.  Fact Sheet for Patients: BloggerCourse.com  Fact Sheet for Healthcare Providers: SeriousBroker.it  This  test is not yet approved or cleared by the [place] FDA and has been authorized for detection and/or diagnosis of SARS-CoV-2 by FDA under an Emergency Use Authorization (EUA). This EUA will remain in effect (meaning this test can be used) for the duration of the COVID-19 declaration under Section 564(b)(1) of the Act, 21 U.S.C. section 360bbb-3(b)(1), unless the authorization is terminated or revoked.  Performed at [facility], [address] [place], [place] [address]    WBC 06/07/2022 5.7  4.0 - 10.5 K/uL Final   RBC 06/07/2022 4.39  3.87 - 5.11 MIL/uL Final   Hemoglobin 06/07/2022 13.2  12.0 - 15.0 g/dL Final   HCT 60/45/409808/25/2023 40.4  36.0 - 46.0 % Final   MCV 06/07/2022 92.0  80.0 - 100.0 fL Final   MCH 06/07/2022 30.1  26.0 - 34.0 pg Final   MCHC 06/07/2022 32.7  30.0 - 36.0 g/dL Final   RDW 11/91/478208/25/2023 12.4  11.5 - 15.5 % Final   Platelets 06/07/2022 308  150 - 400 K/uL Final   nRBC 06/07/2022 0.0  0.0 - 0.2 % Final   Neutrophils Relative % 06/07/2022 42  % Final   Neutro Abs 06/07/2022 2.4  1.7 - 7.7 K/uL Final   Lymphocytes Relative 06/07/2022 54  % Final   Lymphs Abs 06/07/2022 3.1  0.7 - 4.0 K/uL Final   Monocytes Relative 06/07/2022 4  % Final   Monocytes Absolute 06/07/2022 0.2  0.1 - 1.0 K/uL Final   Eosinophils Relative 06/07/2022 0  % Final   Eosinophils Absolute 06/07/2022 0.0  0.0 - 0.5 K/uL Final   Basophils Relative 06/07/2022 0  % Final   Basophils Absolute 06/07/2022 0.0  0.0 - 0.1 K/uL Final   Immature Granulocytes 06/07/2022 0  % Final   Abs Immature Granulocytes 06/07/2022 0.01  0.00 - 0.07  K/uL Final   Performed at [facility], [address] [place], [place] [address]   Sodium 06/07/2022 139  135 - 145 mmol/L Final   Potassium 06/07/2022 4.0  3.5 - 5.1 mmol/L Final   Chloride 06/07/2022 104  98 - 111 mmol/L Final   CO2 06/07/2022 28  22 - 32 mmol/L Final   Glucose, Bld 06/07/2022 104 (H)  70 - 99 mg/dL Final   Glucose reference range applies only to samples taken after fasting for at least 8 hours.   BUN 06/07/2022 8  6 - 20 mg/dL Final   Creatinine, Ser 06/07/2022 0.84  0.44 - 1.00 mg/dL Final   Calcium 30/86/578408/25/2023 9.1  8.9 - 10.3 mg/dL Final   Total Protein 69/62/952808/25/2023 6.9  6.5 - 8.1 g/dL Final   Albumin 41/32/440108/25/2023 3.7  3.5 - 5.0 g/dL Final   AST 02/72/536608/25/2023 19  15 - 41 U/L Final   ALT 06/07/2022 22  0 - 44 U/L Final   Alkaline Phosphatase 06/07/2022 54  38 - 126 U/L Final   Total Bilirubin 06/07/2022 0.4  0.3 - 1.2 mg/dL Final   GFR, Estimated 06/07/2022 >60  >60 mL/min Final   Comment: (NOTE) Calculated using the CKD-EPI Creatinine Equation (2021)    Anion gap 06/07/2022 7  5 - 15 Final   Performed at [facility], [address] [place], [place] [id number]   Hgb A1c MFr Bld 06/07/2022 5.0  4.8 - 5.6 % Final   Comment: (NOTE) Pre diabetes:          5.7%-6.4%  Diabetes:              >  6.4%  Glycemic control for   <7.0% adults with diabetes    Mean Plasma Glucose 06/07/2022 96.8  mg/dL Final   Performed at [facility], [address] [place], [place] [address]   TSH 06/07/2022 1.620  0.350 - 4.500 uIU/mL Final   Comment: Performed by a 3rd Generation assay with a functional sensitivity of <=0.01 uIU/mL. Performed at [facility], [address], [place], [place] [address]    RPR Ser Ql 06/07/2022 NON REACTIVE  NON REACTIVE Final   Performed at [facility], [address] [place], [place] [address]   Color, Urine 06/07/2022 YELLOW  YELLOW Final   APPearance 06/07/2022 HAZY (A)  CLEAR Final   Specific Gravity, Urine 06/07/2022  1.018  1.005 - 1.030 Final   pH 06/07/2022 7.0  5.0 - 8.0 Final   Glucose, UA 06/07/2022 NEGATIVE  NEGATIVE mg/dL Final   Hgb urine dipstick 06/07/2022 NEGATIVE  NEGATIVE Final   Bilirubin Urine 06/07/2022 NEGATIVE  NEGATIVE Final   Ketones, ur 06/07/2022 NEGATIVE  NEGATIVE mg/dL Final   Protein, ur 03/55/9741 NEGATIVE  NEGATIVE mg/dL Final   Nitrite 63/84/5364 NEGATIVE  NEGATIVE Final   Leukocytes,Ua 06/07/2022 NEGATIVE  NEGATIVE Final   Performed at [facility], [address], [place], [place] [address]   Cholesterol 06/07/2022 164  0 - 200 mg/dL Final   Triglycerides 10/06/8249 158 (H)  <150 mg/dL Final   HDL 03/70/4888 44  >40 mg/dL Final   Total CHOL/HDL Ratio 06/07/2022 3.7  RATIO Final   VLDL 06/07/2022 32  0 - 40 mg/dL Final   LDL Cholesterol 06/07/2022 88  0 - 99 mg/dL Final   Comment:        Total Cholesterol/HDL:CHD Risk Coronary Heart Disease Risk Table                     Men   Women  1/2 Average Risk   3.4   3.3  Average Risk       5.0   4.4  2 X Average Risk   9.6   7.1  3 X Average Risk  23.4   11.0        Use the calculated Patient Ratio above and the CHD Risk Table to determine the patient's CHD Risk.        ATP III CLASSIFICATION (LDL):  <100     mg/dL   Optimal  916-945  mg/dL   Near or Above                    Optimal  130-159  mg/dL   Borderline  038-882  mg/dL   High  >800     mg/dL   Very High Performed at [facility], [address], [place], [place] [address]    HIV Screen 4th Generation wRfx 06/07/2022 Non Reactive  Non Reactive Final   Performed at [facility], [address] [place], [place] [address]   SARSCOV2ONAVIRUS 2 AG 06/07/2022 NEGATIVE  NEGATIVE Final   Comment: (NOTE) SARS-CoV-2 antigen NOT DETECTED.   Negative results are presumptive.  Negative results do not preclude SARS-CoV-2 infection and should not be used as the sole basis for treatment or other patient management decisions, including infection   control decisions, particularly in the presence of clinical signs and  symptoms consistent with COVID-19, or in those who have been in contact with the virus.  Negative results must be  combined with clinical observations, patient history, and epidemiological information. The expected result is Negative.  Fact Sheet for Patients: https://www.jennings-kim.com/  Fact Sheet for Healthcare Providers: https://alexander-rogers.biz/  This test is not yet approved or cleared by the [place] FDA and  has been authorized for detection and/or diagnosis of SARS-CoV-2 by FDA under an Emergency Use Authorization (EUA).  This EUA will remain in effect (meaning this test can be used) for the duration of  the COV                          ID-19 declaration under Section 564(b)(1) of the Act, 21 U.S.C. section 360bbb-3(b)(1), unless the authorization is terminated or revoked sooner.     POC Amphetamine UR 06/07/2022 None Detected  NONE DETECTED (Cut Off Level 1000 ng/mL) Final   POC Secobarbital (BAR) 06/07/2022 None Detected  NONE DETECTED (Cut Off Level 300 ng/mL) Final   POC Buprenorphine (BUP) 06/07/2022 None Detected  NONE DETECTED (Cut Off Level 10 ng/mL) Final   POC Oxazepam (BZO) 06/07/2022 None Detected  NONE DETECTED (Cut Off Level 300 ng/mL) Final   POC Cocaine UR 06/07/2022 None Detected  NONE DETECTED (Cut Off Level 300 ng/mL) Final   POC Methamphetamine UR 06/07/2022 None Detected  NONE DETECTED (Cut Off Level 1000 ng/mL) Final   POC Morphine 06/07/2022 None Detected  NONE DETECTED (Cut Off Level 300 ng/mL) Final   POC Methadone UR 06/07/2022 None Detected  NONE DETECTED (Cut Off Level 300 ng/mL) Final   POC Oxycodone UR 06/07/2022 None Detected  NONE DETECTED (Cut Off Level 100 ng/mL) Final   POC Marijuana UR 06/07/2022 None Detected  NONE DETECTED (Cut Off Level 50 ng/mL) Final   Preg Test, Ur 06/08/2022 NEGATIVE  NEGATIVE Final   Comment:        THE  SENSITIVITY OF THIS METHODOLOGY IS >20 mIU/mL. Performed at [facility], [address] [place], [place] [address]    Preg Test, Ur 06/08/2022 NEGATIVE  NEGATIVE Final   Comment:        THE SENSITIVITY OF THIS METHODOLOGY IS >24 mIU/mL     Blood Alcohol level:  Lab Results  Component Value Date   ETH <10 07/04/2022   ETH <10 02/07/2022    Metabolic Disorder Labs: Lab Results  Component Value Date   HGBA1C 5.0 06/07/2022   MPG 96.8 06/07/2022   MPG 96.8 02/07/2022   No results found for: "PROLACTIN" Lab Results  Component Value Date   CHOL 181 07/25/2022   TRIG 118 07/25/2022   HDL 45 07/25/2022   CHOLHDL 4.0 07/25/2022   VLDL 24 07/25/2022   LDLCALC 112 (H) 07/25/2022   LDLCALC 88 06/07/2022    Therapeutic Lab Levels: No results found for: "LITHIUM" No results found for: "VALPROATE" No results found for: "CBMZ"  Physical Findings   GAD-7    Flowsheet Row [facility]17/20[facility]  Total GAD-7 Score 17      PHQ2-9    Flowsheet Row ED from 06/07/2022 in [facility] Office Visit from 12/17/2021 in [facility]  PHQ-2 Total Score 1 2  PHQ-9 Total Score 2 8      Flowsheet Row [facility] 07[facility]25/2022 in [facility] ED from 07/04/2022 in [facility] from 06/14/2022 in [facility]   C-SSRS RISK CATEGORY High  Risk No Risk No Risk        Musculoskeletal  Strength & Muscle Tone: within normal limits Gait & Station: normal Patient leans: N/A  Psychiatric Specialty Exam  Presentation  General Appearance:  Appropriate for Environment  Eye Contact: Good  Speech: Clear and Coherent; Normal Rate  Speech Volume: Normal  Handedness: Right   Mood and Affect  Mood: Euthymic  Affect: Appropriate; Congruent   Thought Process  Thought  Processes: Coherent  Descriptions of Associations:Intact  Orientation:Full (Time, Place and Person)  Thought Content:Logical  Diagnosis of Schizophrenia or Schizoaffective disorder in past: No    Hallucinations:Hallucinations: None  Ideas of Reference:None  Suicidal Thoughts:Suicidal Thoughts: No  Homicidal Thoughts:Homicidal Thoughts: No   Sensorium  Memory: Immediate Fair  Judgment: Intact  Insight: Present   Executive Functions  Concentration: Fair  Attention Span: Fair  Recall: [employer] of Knowledge: Fair  Language: Fair   Psychomotor Activity  Psychomotor Activity: Psychomotor Activity: Normal   Assets  Assets: Communication Skills; Desire for Improvement   Sleep  Sleep: Sleep: Good   No data recorded  Physical Exam  Physical Exam Constitutional:      General: She is not in acute distress.    Appearance: She is not ill-appearing, toxic-appearing or diaphoretic.  Eyes:     General: No scleral icterus. Cardiovascular:     Rate and Rhythm: Normal rate.  Pulmonary:     Effort: Pulmonary effort is normal. No respiratory distress.  Neurological:     Mental Status: She is alert and oriented to person, place, and time.  Psychiatric:        Attention and Perception: Attention and perception normal.        Mood and Affect: Mood and affect normal.        Speech: Speech normal.        Behavior: Behavior normal. Behavior is cooperative.        Thought Content: Thought content normal.    Review of Systems  Constitutional:  Negative for chills and fever.  Respiratory:  Negative for shortness of breath.   Cardiovascular:  Negative for chest pain and palpitations.  Gastrointestinal:  Negative for abdominal pain.  Neurological:  Negative for headaches.  Psychiatric/Behavioral: Negative.     Blood pressure 101/80, pulse 90, temperature 97.9 F (36.6 C), temperature source Oral, resp. rate 18, SpO2 100 %. There is no height or weight on  file to calculate BMI.  Treatment Plan Summary: Plan Pt is psychiatrically cleared. Disposition is pending placement.  Lauree Chandler, NP 08/22/2022 10:35 AM

## 2022-08-23 MED ORDER — ONDANSETRON 4 MG PO TBDP
4.0000 mg | ORAL_TABLET | Freq: Once | ORAL | Status: AC
  Administered 2022-08-24: 4 mg via ORAL
  Filled 2022-08-23: qty 1

## 2022-08-23 NOTE — ED Notes (Signed)
Pt is sleeping. No distress noted. Will continue to monitor safety. 

## 2022-08-23 NOTE — ED Notes (Signed)
Pt resting quietly. Breathing even and unlabored.  

## 2022-08-23 NOTE — ED Notes (Signed)
Pt sleeping in recliner bed. No noted distress. Will continue to monitor for safety 

## 2022-08-23 NOTE — ED Provider Notes (Signed)
Behavioral Health Progress Note  Date and Time: 08/23/2022 8:14 AM Name: Nicole [name] MRN:  [id number]  Subjective:   Lougenia Morrissey is a 31 y.o. female, with PMH of SCZ unspecified, MDD with psychosis, who presented voluntary to [facility] (07/25/2022) via [facility] after a verbal altercation with [name] (not patient's legal guardian) for transient SI.  This is patient's fourth visit to [facility] for similar concerns this year. Patient has been dismissed from previous group home due to threatening SI and HI, then leaving the group home.   Legal Guardian: [name]   On reassessment, pt is asleep, awakens to name being called. Reviewed staff are continuing to follow up on placement. She denies SI/VI/HI, AVH, paranoia. She denies physical complaints. Pt remains psychiatrically cleared. Disposition is pending placement.   Diagnosis:  Final diagnoses:  Severe episode of recurrent major depressive disorder, with psychotic features (HCC)    Total Time spent with patient: 15 minutes  Past Psychiatric History: SCZ unspecified, MDD with psychosis, Anxiety Past Medical History:  Past Medical History:  Diagnosis Date   Anxiety    Depression    Hypothyroidism 08/07/2022   Tobacco use disorder 08/07/2022    Past Surgical History:  Procedure Laterality Date   WISDOM TOOTH EXTRACTION Bilateral 2020   Family History:  Family History  Problem Relation Age of Onset   Hypertension Father    Diabetes Father    Family Psychiatric  History: None reported Social History:  Social History   Substance and Sexual Activity  Alcohol Use Never     Social History   Substance and Sexual Activity  Drug Use Never    Social History   Socioeconomic History   Marital status: Single    Spouse name: Not on file   Number of children: Not on file   Years of education: Not on file   Highest education level: Not on file   Occupational History   Not on file  Tobacco Use   Smoking status: Every Day    Types: Cigarettes   Smokeless tobacco: Never  Vaping Use   Vaping Use: Some days  Substance and Sexual Activity   Alcohol use: Never   Drug use: Never   Sexual activity: Yes    Partners: Female    Birth control/protection: Implant  Other Topics Concern   Not on file  Social History Narrative   Not on file   Social Determinants of Health   Financial Resource Strain: Not on file  Food Insecurity: Not on file  Transportation Needs: Not on file  Physical Activity: Not on file  Stress: Not on file  Social Connections: Not on file   SDOH:  SDOH Screenings   Depression (PHQ2-9): Low Risk  (06/10/2022)  Tobacco Use: High Risk (08/07/2022)   Additional Social History:    Pain Medications: SEE MAR Prescriptions: SEE MAR Over the Counter: SEE MAR History of alcohol / drug use?: Yes Longest period of sobriety (when/how long): N/A Negative Consequences of Use:  (NONE) Withdrawal Symptoms: None Name of Substance 1: ETOH IN PAST--PT CURRENTLY DENIES 1 - Frequency: SOCIAL USE ETOH IN PAST 1 - Method of Aquiring: LEGAL 1- Route of Use: ORAL DRINK Name of Substance 2: NICOTINE-VAPE AND CIGARETTES 2 - Amount (size/oz): LESS THAN ONE PACK 2 - Frequency: DAILY 2 - Method of Aquiring: LEGAL 2 - Route of Substance Use: ORAL SMOKE  Sleep: Good  Appetite:  Good  Current Medications:  Current Facility-Administered Medications  Medication Dose Route Frequency Provider Last Rate Last Admin   acetaminophen (TYLENOL) tablet 650 mg  650 mg Oral Q6H PRN Onuoha, Chinwendu V, NP   650 mg at 08/15/22 1820   alum & mag hydroxide-simeth (MAALOX/MYLANTA) 200-200-20 MG/5ML suspension 30 mL  30 mL Oral Q4H PRN Onuoha, Chinwendu V, NP   30 mL at 07/27/22 2151   ARIPiprazole ER (ABILIFY MAINTENA) injection 400 mg  400 mg Intramuscular Q28 days Merrily Brittle, DO   400 mg at 08/02/22 1154    clotrimazole (LOTRIMIN) 1 % cream   Topical BID Bobbitt, Shalon E, NP   Given at 08/21/22 0927   hydrOXYzine (ATARAX) tablet 25 mg  25 mg Oral TID PRN Onuoha, Chinwendu V, NP   25 mg at 08/22/22 2027   ibuprofen (ADVIL) tablet 400 mg  400 mg Oral Q6H PRN Merrily Brittle, DO   400 mg at 08/22/22 2027   levothyroxine (SYNTHROID) tablet 100 mcg  100 mcg Oral Q0600 Merrily Brittle, DO   100 mcg at 08/23/22 0528   ziprasidone (GEODON) injection 20 mg  20 mg Intramuscular Q12H PRN Onuoha, Chinwendu V, NP       And   LORazepam (ATIVAN) tablet 1 mg  1 mg Oral PRN Onuoha, Chinwendu V, NP       magnesium hydroxide (MILK OF MAGNESIA) suspension 30 mL  30 mL Oral Daily PRN Onuoha, Chinwendu V, NP   30 mL at 08/22/22 1005   nicotine (NICODERM CQ - dosed in mg/24 hours) patch 14 mg  14 mg Transdermal Q0600 Onuoha, Chinwendu V, NP   14 mg at 08/22/22 0947   ondansetron (ZOFRAN) tablet 4 mg  4 mg Oral Once Nwoko, Uchenna E, PA       Oxcarbazepine (TRILEPTAL) tablet 300 mg  300 mg Oral BID Onuoha, Chinwendu V, NP   300 mg at 08/22/22 2054   pantoprazole (PROTONIX) EC tablet 20 mg  20 mg Oral Daily Merrily Brittle, DO   20 mg at 08/22/22 0947   QUEtiapine (SEROQUEL) tablet 400 mg  400 mg Oral BID Onuoha, Chinwendu V, NP   400 mg at 08/22/22 2054   senna-docusate (Senokot-S) tablet 1 tablet  1 tablet Oral QHS Merrily Brittle, DO   1 tablet at 08/22/22 2054   sertraline (ZOLOFT) tablet 150 mg  150 mg Oral Daily Onuoha, Chinwendu V, NP   150 mg at 08/22/22 0948   traZODone (DESYREL) tablet 50 mg  50 mg Oral QHS PRN Onuoha, Chinwendu V, NP   50 mg at 08/22/22 2027   valACYclovir (VALTREX) tablet 500 mg  500 mg Oral Daily Merrily Brittle, DO   500 mg at 08/22/22 [id number]   Current Outpatient Medications  Medication Sig Dispense Refill   ABILIFY MAINTENA 400 MG PRSY prefilled syringe 400 mg every 28 (twenty-eight) days.     cetirizine (ZYRTEC) 10 MG tablet Take 10 mg by mouth daily.     cyclobenzaprine (FLEXERIL) 10 MG tablet Take  1 tablet (10 mg total) by mouth 2 (two) times daily as needed for muscle spasms. 20 tablet 0   fluticasone (FLONASE) 50 MCG/ACT nasal spray Place 1 spray into both nostrils daily.     hydrOXYzine (ATARAX) 50 MG tablet Take 1 tablet (50 mg total) by mouth 2 (two) times daily. 60 tablet 0   meloxicam (MOBIC) 15 MG tablet Take 15 mg by mouth daily.     Oxcarbazepine (TRILEPTAL)  300 MG tablet Take 1 tablet (300 mg total) by mouth 2 (two) times daily. 60 tablet 0   QUEtiapine (SEROQUEL) 400 MG tablet Take 1 tablet (400 mg total) by mouth 2 (two) times daily. 60 tablet 0   sertraline (ZOLOFT) 50 MG tablet Take 3 tablets (150 mg total) by mouth in the morning. 90 tablet 0   traZODone (DESYREL) 100 MG tablet Take 1 tablet (100 mg total) by mouth at bedtime. 30 tablet 0   valACYclovir (VALTREX) 500 MG tablet Take 500 mg by mouth daily.      Labs  Lab Results:  Admission on 07/25/2022  Component Date Value Ref Range Status   SARS Coronavirus 2 by RT PCR 07/25/2022 NEGATIVE  NEGATIVE Final   Comment: (NOTE) SARS-CoV-2 target nucleic acids are NOT DETECTED.  The SARS-CoV-2 RNA is generally detectable in upper respiratory specimens during the acute phase of infection. The lowest concentration of SARS-CoV-2 viral copies this assay can detect is 138 copies/mL. A negative result does not preclude SARS-Cov-2 infection and should not be used as the sole basis for treatment or other patient management decisions. A negative result may occur with  improper specimen collection/handling, submission of specimen other than nasopharyngeal swab, presence of viral mutation(s) within the areas targeted by this assay, and inadequate number of viral copies(<138 copies/mL). A negative result must be combined with clinical observations, patient history, and epidemiological information. The expected result is Negative.  Fact Sheet for Patients:  EntrepreneurPulse.com.au  Fact Sheet for Healthcare  Providers:  IncredibleEmployment.be  This test is no                          t yet approved or cleared by the [place] FDA and  has been authorized for detection and/or diagnosis of SARS-CoV-2 by FDA under an Emergency Use Authorization (EUA). This EUA will remain  in effect (meaning this test can be used) for the duration of the COVID-19 declaration under Section 564(b)(1) of the Act, 21 U.S.C.section 360bbb-3(b)(1), unless the authorization is terminated  or revoked sooner.       Influenza A by PCR 07/25/2022 NEGATIVE  NEGATIVE Final   Influenza B by PCR 07/25/2022 NEGATIVE  NEGATIVE Final   Comment: (NOTE) The Xpert Xpress SARS-CoV-2/FLU/RSV plus assay is intended as an aid in the diagnosis of influenza from Nasopharyngeal swab specimens and should not be used as a sole basis for treatment. Nasal washings and aspirates are unacceptable for Xpert Xpress SARS-CoV-2/FLU/RSV testing.  Fact Sheet for Patients: EntrepreneurPulse.com.au  Fact Sheet for Healthcare Providers: IncredibleEmployment.be  This test is not yet approved or cleared by the [place] FDA and has been authorized for detection and/or diagnosis of SARS-CoV-2 by FDA under an Emergency Use Authorization (EUA). This EUA will remain in effect (meaning this test can be used) for the duration of the COVID-19 declaration under Section 564(b)(1) of the Act, 21 U.S.C. section 360bbb-3(b)(1), unless the authorization is terminated or revoked.  Performed at [facility], [place] [address], [place], [place] [address]    WBC 07/25/2022 8.3  4.0 - 10.5 K/uL Final   RBC 07/25/2022 4.44  3.87 - 5.11 MIL/uL Final   Hemoglobin 07/25/2022 13.7  12.0 - 15.0 g/dL Final   HCT 07/25/2022 40.2  36.0 - 46.0 % Final   MCV 07/25/2022 90.5  80.0 - 100.0 fL Final   MCH 07/25/2022 30.9  26.0 - 34.0 pg Final   MCHC 07/25/2022 34.1  30.0 - 36.0 g/dL Final   RDW 07/25/2022  12.2  11.5 - 15.5 % Final   Platelets 07/25/2022 248  150 - 400 K/uL Final   nRBC 07/25/2022 0.0  0.0 - 0.2 % Final   Neutrophils Relative % 07/25/2022 43  % Final   Neutro Abs 07/25/2022 3.6  1.7 - 7.7 K/uL Final   Lymphocytes Relative 07/25/2022 52  % Final   Lymphs Abs 07/25/2022 4.2 (H)  0.7 - 4.0 K/uL Final   Monocytes Relative 07/25/2022 5  % Final   Monocytes Absolute 07/25/2022 0.4  0.1 - 1.0 K/uL Final   Eosinophils Relative 07/25/2022 0  % Final   Eosinophils Absolute 07/25/2022 0.0  0.0 - 0.5 K/uL Final   Basophils Relative 07/25/2022 0  % Final   Basophils Absolute 07/25/2022 0.0  0.0 - 0.1 K/uL Final   Immature Granulocytes 07/25/2022 0  % Final   Abs Immature Granulocytes 07/25/2022 0.02  0.00 - 0.07 K/uL Final   Performed at [facility], [place] [address], [place], [place] [address]   Sodium 07/25/2022 138  135 - 145 mmol/L Final   Potassium 07/25/2022 4.0  3.5 - 5.1 mmol/L Final   Chloride 07/25/2022 104  98 - 111 mmol/L Final   CO2 07/25/2022 29  22 - 32 mmol/L Final   Glucose, Bld 07/25/2022 83  70 - 99 mg/dL Final   Glucose reference range applies only to samples taken after fasting for at least 8 hours.   BUN 07/25/2022 11  6 - 20 mg/dL Final   Creatinine, Ser 07/25/2022 0.97  0.44 - 1.00 mg/dL Final   Calcium 07/25/2022 9.2  8.9 - 10.3 mg/dL Final   Total Protein 07/25/2022 7.0  6.5 - 8.1 g/dL Final   Albumin 07/25/2022 3.8  3.5 - 5.0 g/dL Final   AST 07/25/2022 18  15 - 41 U/L Final   ALT 07/25/2022 22  0 - 44 U/L Final   Alkaline Phosphatase 07/25/2022 64  38 - 126 U/L Final   Total Bilirubin 07/25/2022 0.2 (L)  0.3 - 1.2 mg/dL Final   GFR, Estimated 07/25/2022 >60  >60 mL/min Final   Comment: (NOTE) Calculated using the CKD-EPI Creatinine Equation (2021)    Anion gap 07/25/2022 5  5 - 15 Final   Performed at [facility] [address] [place], [place] [address]   POC Amphetamine UR 07/25/2022 None Detected  NONE DETECTED (Cut Off Level 1000  ng/mL) Preliminary   POC Secobarbital (BAR) 07/25/2022 None Detected  NONE DETECTED (Cut Off Level 300 ng/mL) Preliminary   POC Buprenorphine (BUP) 07/25/2022 None Detected  NONE DETECTED (Cut Off Level 10 ng/mL) Preliminary   POC Oxazepam (BZO) 07/25/2022 None Detected  NONE DETECTED (Cut Off Level 300 ng/mL) Preliminary   POC Cocaine UR 07/25/2022 None Detected  NONE DETECTED (Cut Off Level 300 ng/mL) Preliminary   POC Methamphetamine UR 07/25/2022 None Detected  NONE DETECTED (Cut Off Level 1000 ng/mL) Preliminary   POC Morphine 07/25/2022 None Detected  NONE DETECTED (Cut Off Level 300 ng/mL) Preliminary   POC Methadone UR 07/25/2022 None Detected  NONE DETECTED (Cut Off Level 300 ng/mL) Preliminary   POC Oxycodone UR 07/25/2022 None Detected  NONE DETECTED (Cut Off Level 100 ng/mL) Preliminary   POC Marijuana UR 07/25/2022 None Detected  NONE DETECTED (Cut Off Level 50 ng/mL) Preliminary   SARSCOV2ONAVIRUS 2 AG 07/25/2022 NEGATIVE  NEGATIVE Final   Comment: (NOTE) SARS-CoV-2 antigen NOT DETECTED.   Negative results  are presumptive.  Negative results do not preclude SARS-CoV-2 infection and should not be used as the sole basis for treatment or other patient management decisions, including infection  control decisions, particularly in the presence of clinical signs and  symptoms consistent with COVID-19, or in those who have been in contact with the virus.  Negative results must be combined with clinical observations, patient history, and epidemiological information. The expected result is Negative.  Fact Sheet for Patients: https://www.jennings-kim.com/  Fact Sheet for Healthcare Providers: https://alexander-rogers.biz/  This test is not yet approved or cleared by the [place] FDA and  has been authorized for detection and/or diagnosis of SARS-CoV-2 by FDA under an Emergency Use Authorization (EUA).  This EUA will remain in effect (meaning this test can  be used) for the duration of  the COV                          ID-19 declaration under Section 564(b)(1) of the Act, 21 U.S.C. section 360bbb-3(b)(1), unless the authorization is terminated or revoked sooner.     Cholesterol 07/25/2022 181  0 - 200 mg/dL Final   Triglycerides 97/11/6376 118  <150 mg/dL Final   HDL 58/85/0277 45  >40 mg/dL Final   Total CHOL/HDL Ratio 07/25/2022 4.0  RATIO Final   VLDL 07/25/2022 24  0 - 40 mg/dL Final   LDL Cholesterol 07/25/2022 112 (H)  0 - 99 mg/dL Final   Comment:        Total Cholesterol/HDL:CHD Risk Coronary Heart Disease Risk Table                     Men   Women  1/2 Average Risk   3.4   3.3  Average Risk       5.0   4.4  2 X Average Risk   9.6   7.1  3 X Average Risk  23.4   11.0        Use the calculated Patient Ratio above and the CHD Risk Table to determine the patient's CHD Risk.        ATP III CLASSIFICATION (LDL):  <100     mg/dL   Optimal  412-878  mg/dL   Near or Above                    Optimal  130-159  mg/dL   Borderline  676-720  mg/dL   High  >947     mg/dL   Very High Performed at [facility], [address] [place], [place] [address]    TSH 07/25/2022 6.668 (H)  0.350 - 4.500 uIU/mL Final   Comment: Performed by a 3rd Generation assay with a functional sensitivity of <=0.01 uIU/mL. Performed at [facility], [address] [place], [place] [address]    Glucose-Capillary 07/26/2022 104 (H)  70 - 99 mg/dL Final   Glucose reference range applies only to samples taken after fasting for at least 8 hours.   T3, Free 07/31/2022 2.3  2.0 - 4.4 pg/mL Final   Comment: (NOTE) Performed At: [facility] [address] [place], [place] [id number] Jolene Schimke MD TW:[id number]    Free T4 07/31/2022 0.60 (L)  0.61 - 1.12 ng/dL Final   Comment: (NOTE) Biotin ingestion may interfere with free T4 tests. If the results are inconsistent with the TSH level, previous test results, or the clinical  presentation, then consider biotin interference. If  needed, order repeat testing after stopping biotin. Performed at [facility], [place] [address] [place], [place] [address]   Admission on 07/04/2022, Discharged on 07/05/2022  Component Date Value Ref Range Status   Sodium 07/04/2022 142  135 - 145 mmol/L Final   Potassium 07/04/2022 4.4  3.5 - 5.1 mmol/L Final   Chloride 07/04/2022 109  98 - 111 mmol/L Final   CO2 07/04/2022 26  22 - 32 mmol/L Final   Glucose, Bld 07/04/2022 96  70 - 99 mg/dL Final   Glucose reference range applies only to samples taken after fasting for at least 8 hours.   BUN 07/04/2022 15  6 - 20 mg/dL Final   Creatinine, Ser 07/04/2022 0.83  0.44 - 1.00 mg/dL Final   Calcium 07/04/2022 9.3  8.9 - 10.3 mg/dL Final   Total Protein 07/04/2022 7.2  6.5 - 8.1 g/dL Final   Albumin 07/04/2022 3.9  3.5 - 5.0 g/dL Final   AST 07/04/2022 23  15 - 41 U/L Final   ALT 07/04/2022 28  0 - 44 U/L Final   Alkaline Phosphatase 07/04/2022 77  38 - 126 U/L Final   Total Bilirubin 07/04/2022 0.4  0.3 - 1.2 mg/dL Final   GFR, Estimated 07/04/2022 >60  >60 mL/min Final   Comment: (NOTE) Calculated using the CKD-EPI Creatinine Equation (2021)    Anion gap 07/04/2022 7  5 - 15 Final   Performed at [facility], [place] [address] [place], [place] [address]   Alcohol, Ethyl (B) 07/04/2022 <10  <10 mg/dL Final   Comment: (NOTE) Lowest detectable limit for serum alcohol is 10 mg/dL.  For medical purposes only. Performed at [facility], [place] [address], [place], [place] [address]    WBC 07/04/2022 7.0  4.0 - 10.5 K/uL Final   RBC 07/04/2022 4.18  3.87 - 5.11 MIL/uL Final   Hemoglobin 07/04/2022 12.8  12.0 - 15.0 g/dL Final   HCT 07/04/2022 39.1  36.0 - 46.0 % Final   MCV 07/04/2022 93.5  80.0 - 100.0 fL Final   MCH 07/04/2022 30.6  26.0 - 34.0 pg Final   MCHC 07/04/2022 32.7  30.0 - 36.0 g/dL Final   RDW 07/04/2022 12.9  11.5 - 15.5 % Final    Platelets 07/04/2022 243  150 - 400 K/uL Final   nRBC 07/04/2022 0.0  0.0 - 0.2 % Final   Neutrophils Relative % 07/04/2022 43  % Final   Neutro Abs 07/04/2022 3.0  1.7 - 7.7 K/uL Final   Lymphocytes Relative 07/04/2022 50  % Final   Lymphs Abs 07/04/2022 3.5  0.7 - 4.0 K/uL Final   Monocytes Relative 07/04/2022 7  % Final   Monocytes Absolute 07/04/2022 0.5  0.1 - 1.0 K/uL Final   Eosinophils Relative 07/04/2022 0  % Final   Eosinophils Absolute 07/04/2022 0.0  0.0 - 0.5 K/uL Final   Basophils Relative 07/04/2022 0  % Final   Basophils Absolute 07/04/2022 0.0  0.0 - 0.1 K/uL Final   Immature Granulocytes 07/04/2022 0  % Final   Abs Immature Granulocytes 07/04/2022 0.01  0.00 - 0.07 K/uL Final   Performed at [facility], [place] [address], [place],  [address]   I-stat hCG, quantitative 07/04/2022 <5.0  <5 mIU/mL Final   Comment 3 07/04/2022          Final   Comment:   GEST. AGE      CONC.  (mIU/mL)   <=1  WEEK        5 - 50     2 WEEKS       50 - 500     3 WEEKS       100 - 10,000     4 WEEKS     1,000 - 30,000        FEMALE AND NON-PREGNANT FEMALE:     LESS THAN 5 mIU/mL   Admission on 06/14/2022, Discharged on 06/14/2022  Component Date Value Ref Range Status   Color, UA 06/14/2022 yellow  yellow Final   Clarity, UA 06/14/2022 cloudy (A)  clear Final   Glucose, UA 06/14/2022 negative  negative mg/dL Final   Bilirubin, UA 06/14/2022 negative  negative Final   Ketones, POC UA 06/14/2022 negative  negative mg/dL Final   Spec Grav, UA 06/14/2022 1.025  1.010 - 1.025 Final   Blood, UA 06/14/2022 negative  negative Final   pH, UA 06/14/2022 7.5  5.0 - 8.0 Final   Protein Ur, POC 06/14/2022 =30 (A)  negative mg/dL Final   Urobilinogen, UA 06/14/2022 0.2  0.2 or 1.0 E.U./dL Final   Nitrite, UA 06/14/2022 Negative  Negative Final   Leukocytes, UA 06/14/2022 Negative  Negative Final   Preg Test, Ur 06/14/2022 Negative  Negative Final   Specimen Description  06/14/2022 URINE, CLEAN CATCH   Final   Special Requests 06/14/2022    Final                   Value:NONE Performed at [facility], [place] [address] [place], [place] [address]    Culture 06/14/2022 MULTIPLE SPECIES PRESENT, SUGGEST RECOLLECTION (A)   Final   Report Status 06/14/2022 06/16/2022 FINAL   Final  Admission on 06/07/2022, Discharged on 06/10/2022  Component Date Value Ref Range Status   SARS Coronavirus 2 by RT PCR 06/07/2022 NEGATIVE  NEGATIVE Final   Comment: (NOTE) SARS-CoV-2 target nucleic acids are NOT DETECTED.  The SARS-CoV-2 RNA is generally detectable in upper respiratory specimens during the acute phase of infection. The lowest concentration of SARS-CoV-2 viral copies this assay can detect is 138 copies/mL. A negative result does not preclude SARS-Cov-2 infection and should not be used as the sole basis for treatment or other patient management decisions. A negative result may occur with  improper specimen collection/handling, submission of specimen other than nasopharyngeal swab, presence of viral mutation(s) within the areas targeted by this assay, and inadequate number of viral copies(<138 copies/mL). A negative result must be combined with clinical observations, patient history, and epidemiological information. The expected result is Negative.  Fact Sheet for Patients:  EntrepreneurPulse.com.au  Fact Sheet for Healthcare Providers:  IncredibleEmployment.be  This test is no                          t yet approved or cleared by the [place] FDA and  has been authorized for detection and/or diagnosis of SARS-CoV-2 by FDA under an Emergency Use Authorization (EUA). This EUA will remain  in effect (meaning this test can be used) for the duration of the COVID-19 declaration under Section 564(b)(1) of the Act, 21 U.S.C.section 360bbb-3(b)(1), unless the authorization is terminated  or revoked sooner.        Influenza A by PCR 06/07/2022 NEGATIVE  NEGATIVE Final   Influenza B by PCR 06/07/2022 NEGATIVE  NEGATIVE Final   Comment: (NOTE) The Xpert Xpress SARS-CoV-2/FLU/RSV plus assay is intended as an aid in  the diagnosis of influenza from Nasopharyngeal swab specimens and should not be used as a sole basis for treatment. Nasal washings and aspirates are unacceptable for Xpert Xpress SARS-CoV-2/FLU/RSV testing.  Fact Sheet for Patients: EntrepreneurPulse.com.au  Fact Sheet for Healthcare Providers: IncredibleEmployment.be  This test is not yet approved or cleared by the [place] FDA and has been authorized for detection and/or diagnosis of SARS-CoV-2 by FDA under an Emergency Use Authorization (EUA). This EUA will remain in effect (meaning this test can be used) for the duration of the COVID-19 declaration under Section 564(b)(1) of the Act, 21 U.S.C. section 360bbb-3(b)(1), unless the authorization is terminated or revoked.  Performed at [facility], [place] [address] [place], [place] [address]    WBC 06/07/2022 5.7  4.0 - 10.5 K/uL Final   RBC 06/07/2022 4.39  3.87 - 5.11 MIL/uL Final   Hemoglobin 06/07/2022 13.2  12.0 - 15.0 g/dL Final   HCT 06/07/2022 40.4  36.0 - 46.0 % Final   MCV 06/07/2022 92.0  80.0 - 100.0 fL Final   MCH 06/07/2022 30.1  26.0 - 34.0 pg Final   MCHC 06/07/2022 32.7  30.0 - 36.0 g/dL Final   RDW 06/07/2022 12.4  11.5 - 15.5 % Final   Platelets 06/07/2022 308  150 - 400 K/uL Final   nRBC 06/07/2022 0.0  0.0 - 0.2 % Final   Neutrophils Relative % 06/07/2022 42  % Final   Neutro Abs 06/07/2022 2.4  1.7 - 7.7 K/uL Final   Lymphocytes Relative 06/07/2022 54  % Final   Lymphs Abs 06/07/2022 3.1  0.7 - 4.0 K/uL Final   Monocytes Relative 06/07/2022 4  % Final   Monocytes Absolute 06/07/2022 0.2  0.1 - 1.0 K/uL Final   Eosinophils Relative 06/07/2022 0  % Final   Eosinophils Absolute 06/07/2022 0.0  0.0 - 0.5 K/uL  Final   Basophils Relative 06/07/2022 0  % Final   Basophils Absolute 06/07/2022 0.0  0.0 - 0.1 K/uL Final   Immature Granulocytes 06/07/2022 0  % Final   Abs Immature Granulocytes 06/07/2022 0.01  0.00 - 0.07 K/uL Final   Performed at [facility], [place] [address] [place], [place] [address]   Sodium 06/07/2022 139  135 - 145 mmol/L Final   Potassium 06/07/2022 4.0  3.5 - 5.1 mmol/L Final   Chloride 06/07/2022 104  98 - 111 mmol/L Final   CO2 06/07/2022 28  22 - 32 mmol/L Final   Glucose, Bld 06/07/2022 104 (H)  70 - 99 mg/dL Final   Glucose reference range applies only to samples taken after fasting for at least 8 hours.   BUN 06/07/2022 8  6 - 20 mg/dL Final   Creatinine, Ser 06/07/2022 0.84  0.44 - 1.00 mg/dL Final   Calcium 06/07/2022 9.1  8.9 - 10.3 mg/dL Final   Total Protein 06/07/2022 6.9  6.5 - 8.1 g/dL Final   Albumin 06/07/2022 3.7  3.5 - 5.0 g/dL Final   AST 06/07/2022 19  15 - 41 U/L Final   ALT 06/07/2022 22  0 - 44 U/L Final   Alkaline Phosphatase 06/07/2022 54  38 - 126 U/L Final   Total Bilirubin 06/07/2022 0.4  0.3 - 1.2 mg/dL Final   GFR, Estimated 06/07/2022 >60  >60 mL/min Final   Comment: (NOTE) Calculated using the CKD-EPI Creatinine Equation (2021)    Anion gap 06/07/2022 7  5 - 15 Final   Performed at [facility]  [address], [place], [place] [address]   Hgb A1c MFr Bld 06/07/2022 5.0  4.8 - 5.6 % Final   Comment: (NOTE) Pre diabetes:          5.7%-6.4%  Diabetes:              >6.4%  Glycemic control for   <7.0% adults with diabetes    Mean Plasma Glucose 06/07/2022 96.8  mg/dL Final   Performed at [facility], [place] [address] [place], [place] [address]   TSH 06/07/2022 1.620  0.350 - 4.500 uIU/mL Final   Comment: Performed by a 3rd Generation assay with a functional sensitivity of <=0.01 uIU/mL. Performed at [facility], [place] [address], [place], [place] [address]    RPR Ser Ql 06/07/2022 NON REACTIVE  NON REACTIVE  Final   Performed at [facility], [place] [address] [place], [place] [address]   Color, Urine 06/07/2022 YELLOW  YELLOW Final   APPearance 06/07/2022 HAZY (A)  CLEAR Final   Specific Gravity, Urine 06/07/2022 1.018  1.005 - 1.030 Final   pH 06/07/2022 7.0  5.0 - 8.0 Final   Glucose, UA 06/07/2022 NEGATIVE  NEGATIVE mg/dL Final   Hgb urine dipstick 06/07/2022 NEGATIVE  NEGATIVE Final   Bilirubin Urine 06/07/2022 NEGATIVE  NEGATIVE Final   Ketones, ur 06/07/2022 NEGATIVE  NEGATIVE mg/dL Final   Protein, ur 06/07/2022 NEGATIVE  NEGATIVE mg/dL Final   Nitrite 06/07/2022 NEGATIVE  NEGATIVE Final   Leukocytes,Ua 06/07/2022 NEGATIVE  NEGATIVE Final   Performed at [facility], [place] [address], [place], [place] [address]   Cholesterol 06/07/2022 164  0 - 200 mg/dL Final   Triglycerides 06/07/2022 158 (H)  <150 mg/dL Final   HDL 06/07/2022 44  >40 mg/dL Final   Total CHOL/HDL Ratio 06/07/2022 3.7  RATIO Final   VLDL 06/07/2022 32  0 - 40 mg/dL Final   LDL Cholesterol 06/07/2022 88  0 - 99 mg/dL Final   Comment:        Total Cholesterol/HDL:CHD Risk Coronary Heart Disease Risk Table                     Men   Women  1/2 Average Risk   3.4   3.3  Average Risk       5.0   4.4  2 X Average Risk   9.6   7.1  3 X Average Risk  23.4   11.0        Use the calculated Patient Ratio above and the CHD Risk Table to determine the patient's CHD Risk.        ATP III CLASSIFICATION (LDL):  <100     mg/dL   Optimal  100-129  mg/dL   Near or Above                    Optimal  130-159  mg/dL   Borderline  160-189  mg/dL   High  >190     mg/dL   Very High Performed at [facility] [address] [place], [place] [address]    HIV Screen 4th Generation wRfx 06/07/2022 Non Reactive  Non Reactive Final   Performed at [facility], [place] [address], [place],  [address]   SARSCOV2ONAVIRUS 2 AG 06/07/2022 NEGATIVE  NEGATIVE Final   Comment: (NOTE) SARS-CoV-2 antigen NOT DETECTED.    Negative results are presumptive.  Negative results do not preclude SARS-CoV-2 infection  and should not be used as the sole basis for treatment or other patient management decisions, including infection  control decisions, particularly in the presence of clinical signs and  symptoms consistent with COVID-19, or in those who have been in contact with the virus.  Negative results must be combined with clinical observations, patient history, and epidemiological information. The expected result is Negative.  Fact Sheet for Patients: HandmadeRecipes.com.cy  Fact Sheet for Healthcare Providers: FuneralLife.at  This test is not yet approved or cleared by the [place] FDA and  has been authorized for detection and/or diagnosis of SARS-CoV-2 by FDA under an Emergency Use Authorization (EUA).  This EUA will remain in effect (meaning this test can be used) for the duration of  the COV                          ID-19 declaration under Section 564(b)(1) of the Act, 21 U.S.C. section 360bbb-3(b)(1), unless the authorization is terminated or revoked sooner.     POC Amphetamine UR 06/07/2022 None Detected  NONE DETECTED (Cut Off Level 1000 ng/mL) Final   POC Secobarbital (BAR) 06/07/2022 None Detected  NONE DETECTED (Cut Off Level 300 ng/mL) Final   POC Buprenorphine (BUP) 06/07/2022 None Detected  NONE DETECTED (Cut Off Level 10 ng/mL) Final   POC Oxazepam (BZO) 06/07/2022 None Detected  NONE DETECTED (Cut Off Level 300 ng/mL) Final   POC Cocaine UR 06/07/2022 None Detected  NONE DETECTED (Cut Off Level 300 ng/mL) Final   POC Methamphetamine UR 06/07/2022 None Detected  NONE DETECTED (Cut Off Level 1000 ng/mL) Final   POC Morphine 06/07/2022 None Detected  NONE DETECTED (Cut Off Level 300 ng/mL) Final   POC Methadone UR 06/07/2022 None Detected  NONE DETECTED (Cut Off Level 300 ng/mL) Final   POC Oxycodone UR 06/07/2022 None Detected  NONE  DETECTED (Cut Off Level 100 ng/mL) Final   POC Marijuana UR 06/07/2022 None Detected  NONE DETECTED (Cut Off Level 50 ng/mL) Final   Preg Test, Ur 06/08/2022 NEGATIVE  NEGATIVE Final   Comment:        THE SENSITIVITY OF THIS METHODOLOGY IS >20 mIU/mL. Performed at [facility], [place] [address], [place], [place] [address]    Preg Test, Ur 06/08/2022 NEGATIVE  NEGATIVE Final   Comment:        THE SENSITIVITY OF THIS METHODOLOGY IS >24 mIU/mL     Blood Alcohol level:  Lab Results  Component Value Date   ETH <10 07/04/2022   ETH <10 [id number]    Metabolic Disorder Labs: Lab Results  Component Value Date   HGBA1C 5.0 06/07/2022   MPG 96.8 06/07/2022   MPG 96.8 02/07/2022   No results found for: "PROLACTIN" Lab Results  Component Value Date   CHOL 181 07/25/2022   TRIG 118 07/25/2022   HDL 45 07/25/2022   CHOLHDL 4.0 07/25/2022   VLDL 24 07/25/2022   LDLCALC 112 (H) 07/25/2022   LDLCALC 88 06/07/2022    Therapeutic Lab Levels: No results found for: "LITHIUM" No results found for: "VALPROATE" No results found for: "CBMZ"  Physical Findings   GAD-7    Flowsheet Row Office Visit from 12/17/2021 in [facility]  Total GAD-7 Score 17      PHQ2-9    [facility] from 06/07/2022 in [facility] Office Visit from 12/17/2021 in [facility]  PHQ-2 Total Score  1 2  PHQ-9 Total Score 2 8      Flowsheet Row ED from 07/25/2022 in [facility] ED from 07/04/2022 in [facility] ED from 06/14/2022 in [facility] High Risk No Risk No Risk        Musculoskeletal  Strength & Muscle Tone: within normal limits Gait & Station: normal Patient leans: N/A  Psychiatric Specialty Exam  Presentation  General Appearance:  Appropriate for Environment  Eye  Contact: Good  Speech: Clear and Coherent; Normal Rate  Speech Volume: Normal  Handedness: Right   Mood and Affect  Mood: Euthymic  Affect: Appropriate; Congruent   Thought Process  Thought Processes: Coherent  Descriptions of Associations:Intact  Orientation:Full (Time, Place and Person)  Thought Content:Logical  Diagnosis of Schizophrenia or Schizoaffective disorder in past: No    Hallucinations:Hallucinations: None  Ideas of Reference:None  Suicidal Thoughts:Suicidal Thoughts: No  Homicidal Thoughts:Homicidal Thoughts: No   Sensorium  Memory: Immediate Fair  Judgment: Intact  Insight: Present   Executive Functions  Concentration: Fair  Attention Span: Fair  Recall: [place] of Knowledge: Fair  Language: Fair   Psychomotor Activity  Psychomotor Activity: Psychomotor Activity: Normal   Assets  Assets: Desire for Improvement; Communication Skills   Sleep  Sleep: Sleep: Good   No data recorded  Physical Exam  Physical Exam Constitutional:      General: She is not in acute distress.    Appearance: She is not ill-appearing, toxic-appearing or diaphoretic.  Eyes:     General: No scleral icterus. Cardiovascular:     Rate and Rhythm: Normal rate.  Pulmonary:     Effort: Pulmonary effort is normal. No respiratory distress.  Neurological:     Mental Status: She is alert and oriented to person, place, and time.  Psychiatric:        Attention and Perception: Attention and perception normal.        Mood and Affect: Mood and affect normal.        Speech: Speech normal.        Behavior: Behavior normal. Behavior is cooperative.        Thought Content: Thought content normal.        Cognition and Memory: Cognition and memory normal.    Review of Systems  Constitutional:  Negative for chills and fever.  Respiratory:  Negative for shortness of breath.   Cardiovascular:  Negative for chest pain and palpitations.   Gastrointestinal:  Negative for abdominal pain.  Neurological:  Negative for headaches.   Blood pressure 100/61, pulse 95, temperature 97.9 F (36.6 C), temperature source Oral, resp. rate 16, SpO2 100 %. There is no height or weight on file to calculate BMI.  Treatment Plan Summary: Plan Pt remains psychiatrically cleared. Disposition is pending placement.  Tharon Aquas, NP 08/23/2022 8:14 AM

## 2022-08-23 NOTE — ED Notes (Signed)
Lunch given.

## 2022-08-23 NOTE — ED Notes (Signed)
Patient observed/assessed in bed/chair resting quietly appearing in no distress and verbalizing no complaints at this time. Will continue to monitor.  

## 2022-08-23 NOTE — ED Notes (Signed)
Pt zoom called her daughter with SW.  Returned to the unit and reported a good conversation. She was given meatloaf and is currently eating in no distress.

## 2022-08-24 NOTE — ED Notes (Signed)
Patient med compliant and ate breakfast. Patient appeared concerned this morning focusing on an invega injection she said she received before. Patient stated invega does not work for her and thinks its bringing her auditory hallucinations back. Patient stated she was starting to hear voices (non command) and kept stating she does not want to be prescribed invega. Reviewed current medications with patient and patient remains cooperative on unit. Patient verbalized no further concerns and denies SI, HI and visual hallucinations with no plan/intent.

## 2022-08-24 NOTE — ED Notes (Signed)
Pt sleeping at present, no distress noted.  Monitoring for safety. 

## 2022-08-24 NOTE — ED Notes (Addendum)
Pt sitting quietly attending to ADL's/ personal grooming.  Pt denies SI, HI, pain and AVH.  Will continue to monitor for safety.

## 2022-08-24 NOTE — ED Provider Notes (Signed)
Behavioral Health Progress Note  Date and Time: 08/24/2022 10:11 AM Name: Nicole [name] MRN:  [id number]  Subjective:   Nicole [name] is a 31 y.o. female, with PMH of SCZ unspecified, MDD with psychosis, who presented voluntary to [facility] (07/25/2022) via [facility] after a verbal altercation with [name] (not patient's legal guardian) for transient SI.  This is patient's fourth visit to [facility] for similar concerns this year. Patient has been dismissed from previous group home due to threatening SI and HI, then leaving the group home.   Legal Guardian: [name]  On reassessment, pt is sitting up on reclining chair/bed eating breakfast. She reports euthymic mood. She denies SI/VI/HI, AVH, paranoia. She reports she was able to facetime with her daughter yesterday which made her happy. Her daughter is 60 y/o and living with her mother and father. She is hoping placement will be secured soon. Pt is calm, cooperative, pleasant. There is no evidence of internal preoccupation, agitation, aggression or distractibility. No delusions or paranoia elicited. Pt remains psychiatrically cleared awaiting placement.  Diagnosis:  Final diagnoses:  Severe episode of recurrent major depressive disorder, with psychotic features (HCC)    Total Time spent with patient: 15 minutes  Past Psychiatric History: SCZ unspecified, MDD with psychosis, Anxiety Past Medical History:  Past Medical History:  Diagnosis Date   Anxiety    Depression    Hypothyroidism 08/07/2022   Tobacco use disorder 08/07/2022    Past Surgical History:  Procedure Laterality Date   WISDOM TOOTH EXTRACTION Bilateral 2020   Family History:  Family History  Problem Relation Age of Onset   Hypertension Father    Diabetes Father    Family Psychiatric  History: None reported Social History:  Social History   Substance and Sexual Activity  Alcohol Use  Never     Social History   Substance and Sexual Activity  Drug Use Never    Social History   Socioeconomic History   Marital status: Single    Spouse name: Not on file   Number of children: Not on file   Years of education: Not on file   Highest education level: Not on file  Occupational History   Not on file  Tobacco Use   Smoking status: Every Day    Types: Cigarettes   Smokeless tobacco: Never  Vaping Use   Vaping Use: Some days  Substance and Sexual Activity   Alcohol use: Never   Drug use: Never   Sexual activity: Yes    Partners: Female    Birth control/protection: Implant  Other Topics Concern   Not on file  Social History Narrative   Not on file   Social Determinants of Health   Financial Resource Strain: Not on file  Food Insecurity: Not on file  Transportation Needs: Not on file  Physical Activity: Not on file  Stress: Not on file  Social Connections: Not on file   SDOH:  SDOH Screenings   Depression (PHQ2-9): Low Risk  (06/10/2022)  Tobacco Use: High Risk (08/07/2022)   Additional Social History:    Pain Medications: SEE MAR Prescriptions: SEE MAR Over the Counter: SEE MAR History of alcohol / drug use?: Yes Longest period of sobriety (when/how long): N/A Negative Consequences of Use:  (NONE) Withdrawal Symptoms: None Name of Substance 1: ETOH IN PAST--PT CURRENTLY DENIES 1 - Frequency: SOCIAL USE ETOH IN PAST 1 - Method of Aquiring: LEGAL 1- Route of  Use: ORAL DRINK Name of Substance 2: NICOTINE-VAPE AND CIGARETTES 2 - Amount (size/oz): LESS THAN ONE PACK 2 - Frequency: DAILY 2 - Method of Aquiring: LEGAL 2 - Route of Substance Use: ORAL SMOKE                Sleep: Good  Appetite:  Good  Current Medications:  Current Facility-Administered Medications  Medication Dose Route Frequency Provider Last Rate Last Admin   acetaminophen (TYLENOL) tablet 650 mg  650 mg Oral Q6H PRN Onuoha, Chinwendu V, NP   650 mg at 08/15/22 1820    alum & mag hydroxide-simeth (MAALOX/MYLANTA) 200-200-20 MG/5ML suspension 30 mL  30 mL Oral Q4H PRN Onuoha, Chinwendu V, NP   30 mL at 07/27/22 2151   ARIPiprazole ER (ABILIFY MAINTENA) injection 400 mg  400 mg Intramuscular Q28 days Princess Bruins, DO   400 mg at 08/02/22 1154   clotrimazole (LOTRIMIN) 1 % cream   Topical BID Bobbitt, Shalon E, NP   1 Application at 08/23/22 2123   hydrOXYzine (ATARAX) tablet 25 mg  25 mg Oral TID PRN Onuoha, Chinwendu V, NP   25 mg at 08/23/22 1000   ibuprofen (ADVIL) tablet 400 mg  400 mg Oral Q6H PRN Princess Bruins, DO   400 mg at 08/22/22 2027   levothyroxine (SYNTHROID) tablet 100 mcg  100 mcg Oral Q0600 Princess Bruins, DO   100 mcg at 08/24/22 0606   ziprasidone (GEODON) injection 20 mg  20 mg Intramuscular Q12H PRN Onuoha, Chinwendu V, NP       And   LORazepam (ATIVAN) tablet 1 mg  1 mg Oral PRN Onuoha, Chinwendu V, NP       magnesium hydroxide (MILK OF MAGNESIA) suspension 30 mL  30 mL Oral Daily PRN Onuoha, Chinwendu V, NP   30 mL at 08/22/22 1005   nicotine (NICODERM CQ - dosed in mg/24 hours) patch 14 mg  14 mg Transdermal Q0600 Onuoha, Chinwendu V, NP   14 mg at 08/24/22 0944   Oxcarbazepine (TRILEPTAL) tablet 300 mg  300 mg Oral BID Onuoha, Chinwendu V, NP   300 mg at 08/24/22 0943   pantoprazole (PROTONIX) EC tablet 20 mg  20 mg Oral Daily Princess Bruins, DO   20 mg at 08/24/22 0943   QUEtiapine (SEROQUEL) tablet 400 mg  400 mg Oral BID Onuoha, Chinwendu V, NP   400 mg at 08/24/22 0943   senna-docusate (Senokot-S) tablet 1 tablet  1 tablet Oral QHS Princess Bruins, DO   1 tablet at 08/23/22 2124   sertraline (ZOLOFT) tablet 150 mg  150 mg Oral Daily Onuoha, Chinwendu V, NP   150 mg at 08/24/22 0943   traZODone (DESYREL) tablet 50 mg  50 mg Oral QHS PRN Onuoha, Chinwendu V, NP   50 mg at 08/23/22 2124   valACYclovir (VALTREX) tablet 500 mg  500 mg Oral Daily Princess Bruins, DO   500 mg at 08/24/22 4540   Current Outpatient Medications  Medication Sig  Dispense Refill   ABILIFY MAINTENA 400 MG PRSY prefilled syringe 400 mg every 28 (twenty-eight) days.     cetirizine (ZYRTEC) 10 MG tablet Take 10 mg by mouth daily.     cyclobenzaprine (FLEXERIL) 10 MG tablet Take 1 tablet (10 mg total) by mouth 2 (two) times daily as needed for muscle spasms. 20 tablet 0   fluticasone (FLONASE) 50 MCG/ACT nasal spray Place 1 spray into both nostrils daily.     hydrOXYzine (ATARAX) 50 MG tablet Take 1  tablet (50 mg total) by mouth 2 (two) times daily. 60 tablet 0   meloxicam (MOBIC) 15 MG tablet Take 15 mg by mouth daily.     Oxcarbazepine (TRILEPTAL) 300 MG tablet Take 1 tablet (300 mg total) by mouth 2 (two) times daily. 60 tablet 0   QUEtiapine (SEROQUEL) 400 MG tablet Take 1 tablet (400 mg total) by mouth 2 (two) times daily. 60 tablet 0   sertraline (ZOLOFT) 50 MG tablet Take 3 tablets (150 mg total) by mouth in the morning. 90 tablet 0   traZODone (DESYREL) 100 MG tablet Take 1 tablet (100 mg total) by mouth at bedtime. 30 tablet 0   valACYclovir (VALTREX) 500 MG tablet Take 500 mg by mouth daily.      Labs  Lab Results:  Admission on 07/25/2022  Component Date Value Ref Range Status   SARS Coronavirus 2 by RT PCR 07/25/2022 NEGATIVE  NEGATIVE Final   Comment: (NOTE) SARS-CoV-2 target nucleic acids are NOT DETECTED.  The SARS-CoV-2 RNA is generally detectable in upper respiratory specimens during the acute phase of infection. The lowest concentration of SARS-CoV-2 viral copies this assay can detect is 138 copies/mL. A negative result does not preclude SARS-Cov-2 infection and should not be used as the sole basis for treatment or other patient management decisions. A negative result may occur with  improper specimen collection/handling, submission of specimen other than nasopharyngeal swab, presence of viral mutation(s) within the areas targeted by this assay, and inadequate number of viral copies(<138 copies/mL). A negative result must be  combined with clinical observations, patient history, and epidemiological information. The expected result is Negative.  Fact Sheet for Patients:  BloggerCourse.com  Fact Sheet for Healthcare Providers:  SeriousBroker.it  This test is no                          t yet approved or cleared by the [place] FDA and  has been authorized for detection and/or diagnosis of SARS-CoV-2 by FDA under an Emergency Use Authorization (EUA). This EUA will remain  in effect (meaning this test can be used) for the duration of the COVID-19 declaration under Section 564(b)(1) of the Act, 21 U.S.C.section 360bbb-3(b)(1), unless the authorization is terminated  or revoked sooner.       Influenza A by PCR 07/25/2022 NEGATIVE  NEGATIVE Final   Influenza B by PCR 07/25/2022 NEGATIVE  NEGATIVE Final   Comment: (NOTE) The Xpert Xpress SARS-CoV-2/FLU/RSV plus assay is intended as an aid in the diagnosis of influenza from Nasopharyngeal swab specimens and should not be used as a sole basis for treatment. Nasal washings and aspirates are unacceptable for Xpert Xpress SARS-CoV-2/FLU/RSV testing.  Fact Sheet for Patients: BloggerCourse.com  Fact Sheet for Healthcare Providers: SeriousBroker.it  This test is not yet approved or cleared by the [place] FDA and has been authorized for detection and/or diagnosis of SARS-CoV-2 by FDA under an Emergency Use Authorization (EUA). This EUA will remain in effect (meaning this test can be used) for the duration of the COVID-19 declaration under Section 564(b)(1) of the Act, 21 U.S.C. section 360bbb-3(b)(1), unless the authorization is terminated or revoked.  Performed at [facility], [address] [place], [place] [address]    WBC 07/25/2022 8.3  4.0 - 10.5 K/uL Final   RBC 07/25/2022 4.44  3.87 - 5.11 MIL/uL Final   Hemoglobin 07/25/2022 13.7   12.0 - 15.0 g/dL Final   HCT 40/98/1191 40.2  36.0 - 46.0 % Final   MCV 07/25/2022 90.5  80.0 - 100.0 fL Final   MCH 07/25/2022 30.9  26.0 - 34.0 pg Final   MCHC 07/25/2022 34.1  30.0 - 36.0 g/dL Final   RDW 16/07/9603 12.2  11.5 - 15.5 % Final   Platelets 07/25/2022 248  150 - 400 K/uL Final   nRBC 07/25/2022 0.0  0.0 - 0.2 % Final   Neutrophils Relative % 07/25/2022 43  % Final   Neutro Abs 07/25/2022 3.6  1.7 - 7.7 K/uL Final   Lymphocytes Relative 07/25/2022 52  % Final   Lymphs Abs 07/25/2022 4.2 (H)  0.7 - 4.0 K/uL Final   Monocytes Relative 07/25/2022 5  % Final   Monocytes Absolute 07/25/2022 0.4  0.1 - 1.0 K/uL Final   Eosinophils Relative 07/25/2022 0  % Final   Eosinophils Absolute 07/25/2022 0.0  0.0 - 0.5 K/uL Final   Basophils Relative 07/25/2022 0  % Final   Basophils Absolute 07/25/2022 0.0  0.0 - 0.1 K/uL Final   Immature Granulocytes 07/25/2022 0  % Final   Abs Immature Granulocytes 07/25/2022 0.02  0.00 - 0.07 K/uL Final   Performed at [facility], [address] [place], [place] [address]   Sodium 07/25/2022 138  135 - 145 mmol/L Final   Potassium 07/25/2022 4.0  3.5 - 5.1 mmol/L Final   Chloride 07/25/2022 104  98 - 111 mmol/L Final   CO2 07/25/2022 29  22 - 32 mmol/L Final   Glucose, Bld 07/25/2022 83  70 - 99 mg/dL Final   Glucose reference range applies only to samples taken after fasting for at least 8 hours.   BUN 07/25/2022 11  6 - 20 mg/dL Final   Creatinine, Ser 07/25/2022 0.97  0.44 - 1.00 mg/dL Final   Calcium 11/91/4782 9.2  8.9 - 10.3 mg/dL Final   Total Protein 95/62/1308 7.0  6.5 - 8.1 g/dL Final   Albumin 65/78/4696 3.8  3.5 - 5.0 g/dL Final   AST 29/52/8413 18  15 - 41 U/L Final   ALT 07/25/2022 22  0 - 44 U/L Final   Alkaline Phosphatase 07/25/2022 64  38 - 126 U/L Final   Total Bilirubin 07/25/2022 0.2 (L)  0.3 - 1.2 mg/dL Final   GFR, Estimated 07/25/2022 >60  >60 mL/min Final   Comment: (NOTE) Calculated using the CKD-EPI  Creatinine Equation (2021)    Anion gap 07/25/2022 5  5 - 15 Final   Performed at [facility], [address], [place], [place] [address]   POC Amphetamine UR 07/25/2022 None Detected  NONE DETECTED (Cut Off Level 1000 ng/mL) Preliminary   POC Secobarbital (BAR) 07/25/2022 None Detected  NONE DETECTED (Cut Off Level 300 ng/mL) Preliminary   POC Buprenorphine (BUP) 07/25/2022 None Detected  NONE DETECTED (Cut Off Level 10 ng/mL) Preliminary   POC Oxazepam (BZO) 07/25/2022 None Detected  NONE DETECTED (Cut Off Level 300 ng/mL) Preliminary   POC Cocaine UR 07/25/2022 None Detected  NONE DETECTED (Cut Off Level 300 ng/mL) Preliminary   POC Methamphetamine UR 07/25/2022 None Detected  NONE DETECTED (Cut Off Level 1000 ng/mL) Preliminary   POC Morphine 07/25/2022 None Detected  NONE DETECTED (Cut Off Level 300 ng/mL) Preliminary   POC Methadone UR 07/25/2022 None Detected  NONE DETECTED (Cut Off Level 300 ng/mL) Preliminary   POC Oxycodone UR 07/25/2022 None Detected  NONE DETECTED (Cut Off Level 100 ng/mL) Preliminary   POC Marijuana UR 07/25/2022 None  Detected  NONE DETECTED (Cut Off Level 50 ng/mL) Preliminary   SARSCOV2ONAVIRUS 2 AG 07/25/2022 NEGATIVE  NEGATIVE Final   Comment: (NOTE) SARS-CoV-2 antigen NOT DETECTED.   Negative results are presumptive.  Negative results do not preclude SARS-CoV-2 infection and should not be used as the sole basis for treatment or other patient management decisions, including infection  control decisions, particularly in the presence of clinical signs and  symptoms consistent with COVID-19, or in those who have been in contact with the virus.  Negative results must be combined with clinical observations, patient history, and epidemiological information. The expected result is Negative.  Fact Sheet for Patients: https://www.jennings-kim.com/  Fact Sheet for Healthcare Providers: https://alexander-rogers.biz/  This test is  not yet approved or cleared by the [place] FDA and  has been authorized for detection and/or diagnosis of SARS-CoV-2 by FDA under an Emergency Use Authorization (EUA).  This EUA will remain in effect (meaning this test can be used) for the duration of  the COV                          ID-19 declaration under Section 564(b)(1) of the Act, 21 U.S.C. section 360bbb-3(b)(1), unless the authorization is terminated or revoked sooner.     Cholesterol 07/25/2022 181  0 - 200 mg/dL Final   Triglycerides 30/86/5784 118  <150 mg/dL Final   HDL 69/62/9528 45  >40 mg/dL Final   Total CHOL/HDL Ratio 07/25/2022 4.0  RATIO Final   VLDL 07/25/2022 24  0 - 40 mg/dL Final   LDL Cholesterol 07/25/2022 112 (H)  0 - 99 mg/dL Final   Comment:        Total Cholesterol/HDL:CHD Risk Coronary Heart Disease Risk Table                     Men   Women  1/2 Average Risk   3.4   3.3  Average Risk       5.0   4.4  2 X Average Risk   9.6   7.1  3 X Average Risk  23.4   11.0        Use the calculated Patient Ratio above and the CHD Risk Table to determine the patient's CHD Risk.        ATP III CLASSIFICATION (LDL):  <100     mg/dL   Optimal  413-244  mg/dL   Near or Above                    Optimal  130-159  mg/dL   Borderline  010-272  mg/dL   High  >536     mg/dL   Very High Performed at [facility], [address] [place], [place] [address]    TSH 07/25/2022 6.668 (H)  0.350 - 4.500 uIU/mL Final   Comment: Performed by a 3rd Generation assay with a functional sensitivity of <=0.01 uIU/mL. Performed at [facility], [address], [place], [place] [address]    Glucose-Capillary 07/26/2022 104 (H)  70 - 99 mg/dL Final   Glucose reference range applies only to samples taken after fasting for at least 8 hours.   T3, Free 07/31/2022 2.3  2.0 - 4.4 pg/mL Final   Comment: (NOTE) Performed At: [facility] [address] [place], [place] [id number] Jolene Schimke MD  PP:[phone]    Free T4 07/31/2022 0.60 (L)  0.61 - 1.12 ng/dL Final  Comment: (NOTE) Biotin ingestion may interfere with free T4 tests. If the results are inconsistent with the TSH level, previous test results, or the clinical presentation, then consider biotin interference. If needed, order repeat testing after stopping biotin. Performed at [facility], [address], [place], [place] [address]   Admission on 07/04/2022, Discharged on 07/05/2022  Component Date Value Ref Range Status   Sodium 07/04/2022 142  135 - 145 mmol/L Final   Potassium 07/04/2022 4.4  3.5 - 5.1 mmol/L Final   Chloride 07/04/2022 109  98 - 111 mmol/L Final   CO2 07/04/2022 26  22 - 32 mmol/L Final   Glucose, Bld 07/04/2022 96  70 - 99 mg/dL Final   Glucose reference range applies only to samples taken after fasting for at least 8 hours.   BUN 07/04/2022 15  6 - 20 mg/dL Final   Creatinine, Ser 07/04/2022 0.83  0.44 - 1.00 mg/dL Final   Calcium 93/57/0177 9.3  8.9 - 10.3 mg/dL Final   Total Protein 93/90/3009 7.2  6.5 - 8.1 g/dL Final   Albumin 23/30/0762 3.9  3.5 - 5.0 g/dL Final   AST 26/33/3545 23  15 - 41 U/L Final   ALT 07/04/2022 28  0 - 44 U/L Final   Alkaline Phosphatase 07/04/2022 77  38 - 126 U/L Final   Total Bilirubin 07/04/2022 0.4  0.3 - 1.2 mg/dL Final   GFR, Estimated 07/04/2022 >60  >60 mL/min Final   Comment: (NOTE) Calculated using the CKD-EPI Creatinine Equation (2021)    Anion gap 07/04/2022 7  5 - 15 Final   Performed at [facility], [address], [place], [place] [address]   Alcohol, Ethyl (B) 07/04/2022 <10  <10 mg/dL Final   Comment: (NOTE) Lowest detectable limit for serum alcohol is 10 mg/dL.  For medical purposes only. Performed at [facility], [address] [place], [place] [address]    WBC 07/04/2022 7.0  4.0 - 10.5 K/uL Final   RBC 07/04/2022 4.18  3.87 - 5.11 MIL/uL Final   Hemoglobin 07/04/2022 12.8  12.0 - 15.0  g/dL Final   HCT 42/87/6811 39.1  36.0 - 46.0 % Final   MCV 07/04/2022 93.5  80.0 - 100.0 fL Final   MCH 07/04/2022 30.6  26.0 - 34.0 pg Final   MCHC 07/04/2022 32.7  30.0 - 36.0 g/dL Final   RDW 57/26/2035 12.9  11.5 - 15.5 % Final   Platelets 07/04/2022 243  150 - 400 K/uL Final   nRBC 07/04/2022 0.0  0.0 - 0.2 % Final   Neutrophils Relative % 07/04/2022 43  % Final   Neutro Abs 07/04/2022 3.0  1.7 - 7.7 K/uL Final   Lymphocytes Relative 07/04/2022 50  % Final   Lymphs Abs 07/04/2022 3.5  0.7 - 4.0 K/uL Final   Monocytes Relative 07/04/2022 7  % Final   Monocytes Absolute 07/04/2022 0.5  0.1 - 1.0 K/uL Final   Eosinophils Relative 07/04/2022 0  % Final   Eosinophils Absolute 07/04/2022 0.0  0.0 - 0.5 K/uL Final   Basophils Relative 07/04/2022 0  % Final   Basophils Absolute 07/04/2022 0.0  0.0 - 0.1 K/uL Final   Immature Granulocytes 07/04/2022 0  % Final   Abs Immature Granulocytes 07/04/2022 0.01  0.00 - 0.07 K/uL Final   Performed at [facility], [address], [place], [place] [address]   I-stat hCG, quantitative 07/04/2022 <5.0  <5 mIU/mL Final  Comment 3 07/04/2022          Final   Comment:   GEST. AGE      CONC.  (mIU/mL)   <=1 WEEK        5 - 50     2 WEEKS       50 - 500     3 WEEKS       100 - 10,000     4 WEEKS     1,000 - 30,000        FEMALE AND NON-PREGNANT FEMALE:     LESS THAN 5 mIU/mL   Admission on 06/14/2022, Discharged on 06/14/2022  Component Date Value Ref Range Status   Color, UA 06/14/2022 yellow  yellow Final   Clarity, UA 06/14/2022 cloudy (A)  clear Final   Glucose, UA 06/14/2022 negative  negative mg/dL Final   Bilirubin, UA 78/46/9629 negative  negative Final   Ketones, POC UA 06/14/2022 negative  negative mg/dL Final   Spec Grav, UA 52/84/1324 1.025  1.010 - 1.025 Final   Blood, UA 06/14/2022 negative  negative Final   pH, UA 06/14/2022 7.5  5.0 - 8.0 Final   Protein Ur, POC 06/14/2022 =30 (A)  negative mg/dL Final    Urobilinogen, UA 06/14/2022 0.2  0.2 or 1.0 E.U./dL Final   Nitrite, UA 40/07/2724 Negative  Negative Final   Leukocytes, UA 06/14/2022 Negative  Negative Final   Preg Test, Ur 06/14/2022 Negative  Negative Final   Specimen Description 06/14/2022 URINE, CLEAN CATCH   Final   Special Requests 06/14/2022    Final                   Value:NONE Performed at [facility], [address], [place], [place] [address]    Culture 06/14/2022 MULTIPLE SPECIES PRESENT, SUGGEST RECOLLECTION (A)   Final   Report Status 06/14/2022 06/16/2022 FINAL   Final  Admission on 06/07/2022, Discharged on 06/10/2022  Component Date Value Ref Range Status   SARS Coronavirus 2 by RT PCR 06/07/2022 NEGATIVE  NEGATIVE Final   Comment: (NOTE) SARS-CoV-2 target nucleic acids are NOT DETECTED.  The SARS-CoV-2 RNA is generally detectable in upper respiratory specimens during the acute phase of infection. The lowest concentration of SARS-CoV-2 viral copies this assay can detect is 138 copies/mL. A negative result does not preclude SARS-Cov-2 infection and should not be used as the sole basis for treatment or other patient management decisions. A negative result may occur with  improper specimen collection/handling, submission of specimen other than nasopharyngeal swab, presence of viral mutation(s) within the areas targeted by this assay, and inadequate number of viral copies(<138 copies/mL). A negative result must be combined with clinical observations, patient history, and epidemiological information. The expected result is Negative.  Fact Sheet for Patients:  BloggerCourse.com  Fact Sheet for Healthcare Providers:  SeriousBroker.it  This test is no                          t yet approved or cleared by the [place] FDA and  has been authorized for detection and/or diagnosis of SARS-CoV-2 by FDA under an Emergency Use Authorization (EUA). This EUA will  remain  in effect (meaning this test can be used) for the duration of the COVID-19 declaration under Section 564(b)(1) of the Act, 21 U.S.C.section 360bbb-3(b)(1), unless the authorization is terminated  or revoked sooner.       Influenza A by PCR 06/07/2022  NEGATIVE  NEGATIVE Final   Influenza B by PCR 06/07/2022 NEGATIVE  NEGATIVE Final   Comment: (NOTE) The Xpert Xpress SARS-CoV-2/FLU/RSV plus assay is intended as an aid in the diagnosis of influenza from Nasopharyngeal swab specimens and should not be used as a sole basis for treatment. Nasal washings and aspirates are unacceptable for Xpert Xpress SARS-CoV-2/FLU/RSV testing.  Fact Sheet for Patients: BloggerCourse.comhttps://www.fda.gov/media/152166/download  Fact Sheet for Healthcare Providers: SeriousBroker.ithttps://www.fda.gov/media/152162/download  This test is not yet approved or cleared by the [place] FDA and has been authorized for detection and/or diagnosis of SARS-CoV-2 by FDA under an Emergency Use Authorization (EUA). This EUA will remain in effect (meaning this test can be used) for the duration of the COVID-19 declaration under Section 564(b)(1) of the Act, 21 U.S.C. section 360bbb-3(b)(1), unless the authorization is terminated or revoked.  Performed at [facility], [address] [place], [place] [address]    WBC 06/07/2022 5.7  4.0 - 10.5 K/uL Final   RBC 06/07/2022 4.39  3.87 - 5.11 MIL/uL Final   Hemoglobin 06/07/2022 13.2  12.0 - 15.0 g/dL Final   HCT 60/45/409808/25/2023 40.4  36.0 - 46.0 % Final   MCV 06/07/2022 92.0  80.0 - 100.0 fL Final   MCH 06/07/2022 30.1  26.0 - 34.0 pg Final   MCHC 06/07/2022 32.7  30.0 - 36.0 g/dL Final   RDW 11/91/478208/25/2023 12.4  11.5 - 15.5 % Final   Platelets 06/07/2022 308  150 - 400 K/uL Final   nRBC 06/07/2022 0.0  0.0 - 0.2 % Final   Neutrophils Relative % 06/07/2022 42  % Final   Neutro Abs 06/07/2022 2.4  1.7 - 7.7 K/uL Final   Lymphocytes Relative 06/07/2022 54  % Final   Lymphs Abs  06/07/2022 3.1  0.7 - 4.0 K/uL Final   Monocytes Relative 06/07/2022 4  % Final   Monocytes Absolute 06/07/2022 0.2  0.1 - 1.0 K/uL Final   Eosinophils Relative 06/07/2022 0  % Final   Eosinophils Absolute 06/07/2022 0.0  0.0 - 0.5 K/uL Final   Basophils Relative 06/07/2022 0  % Final   Basophils Absolute 06/07/2022 0.0  0.0 - 0.1 K/uL Final   Immature Granulocytes 06/07/2022 0  % Final   Abs Immature Granulocytes 06/07/2022 0.01  0.00 - 0.07 K/uL Final   Performed at [facility], [address] [place], [place] [address]   Sodium 06/07/2022 139  135 - 145 mmol/L Final   Potassium 06/07/2022 4.0  3.5 - 5.1 mmol/L Final   Chloride 06/07/2022 104  98 - 111 mmol/L Final   CO2 06/07/2022 28  22 - 32 mmol/L Final   Glucose, Bld 06/07/2022 104 (H)  70 - 99 mg/dL Final   Glucose reference range applies only to samples taken after fasting for at least 8 hours.   BUN 06/07/2022 8  6 - 20 mg/dL Final   Creatinine, Ser 06/07/2022 0.84  0.44 - 1.00 mg/dL Final   Calcium 30/86/578408/25/2023 9.1  8.9 - 10.3 mg/dL Final   Total Protein 69/62/952808/25/2023 6.9  6.5 - 8.1 g/dL Final   Albumin 41/32/440108/25/2023 3.7  3.5 - 5.0 g/dL Final   AST 02/72/536608/25/2023 19  15 - 41 U/L Final   ALT 06/07/2022 22  0 - 44 U/L Final   Alkaline Phosphatase 06/07/2022 54  38 - 126 U/L Final   Total Bilirubin 06/07/2022 0.4  0.3 - 1.2 mg/dL Final   GFR, Estimated 06/07/2022 >60  >60 mL/min Final  Comment: (NOTE) Calculated using the CKD-EPI Creatinine Equation (2021)    Anion gap 06/07/2022 7  5 - 15 Final   Performed at [facility], [address], [place], [place] [address]   Hgb A1c MFr Bld 06/07/2022 5.0  4.8 - 5.6 % Final   Comment: (NOTE) Pre diabetes:          5.7%-6.4%  Diabetes:              >6.4%  Glycemic control for   <7.0% adults with diabetes    Mean Plasma Glucose 06/07/2022 96.8  mg/dL Final   Performed at [facility], [address] [place], [place] [address]   TSH 06/07/2022 1.620  0.350 - 4.500  uIU/mL Final   Comment: Performed by a 3rd Generation assay with a functional sensitivity of <=0.01 uIU/mL. Performed at [facility], [address], [place], [place] [address]    RPR Ser Ql 06/07/2022 NON REACTIVE  NON REACTIVE Final   Performed at [facility], [address] [place], [place] [address]   Color, Urine 06/07/2022 YELLOW  YELLOW Final   APPearance 06/07/2022 HAZY (A)  CLEAR Final   Specific Gravity, Urine 06/07/2022 1.018  1.005 - 1.030 Final   pH 06/07/2022 7.0  5.0 - 8.0 Final   Glucose, UA 06/07/2022 NEGATIVE  NEGATIVE mg/dL Final   Hgb urine dipstick 06/07/2022 NEGATIVE  NEGATIVE Final   Bilirubin Urine 06/07/2022 NEGATIVE  NEGATIVE Final   Ketones, ur 06/07/2022 NEGATIVE  NEGATIVE mg/dL Final   Protein, ur 29/56/2130 NEGATIVE  NEGATIVE mg/dL Final   Nitrite 86/57/8469 NEGATIVE  NEGATIVE Final   Leukocytes,Ua 06/07/2022 NEGATIVE  NEGATIVE Final   Performed at [facility], [address] [place], [place] [address]   Cholesterol 06/07/2022 164  0 - 200 mg/dL Final   Triglycerides 84/13/2440 158 (H)  <150 mg/dL Final   HDL 08/10/2535 44  >40 mg/dL Final   Total CHOL/HDL Ratio 06/07/2022 3.7  RATIO Final   VLDL 06/07/2022 32  0 - 40 mg/dL Final   LDL Cholesterol 06/07/2022 88  0 - 99 mg/dL Final   Comment:        Total Cholesterol/HDL:CHD Risk Coronary Heart Disease Risk Table                     Men   Women  1/2 Average Risk   3.4   3.3  Average Risk       5.0   4.4  2 X Average Risk   9.6   7.1  3 X Average Risk  23.4   11.0        Use the calculated Patient Ratio above and the CHD Risk Table to determine the patient's CHD Risk.        ATP III CLASSIFICATION (LDL):  <100     mg/dL   Optimal  644-034  mg/dL   Near or Above                    Optimal  130-159  mg/dL   Borderline  742-595  mg/dL   High  >638     mg/dL   Very High Performed at [facility], [address], [place], [place] [address]    HIV Screen 4th Generation  wRfx 06/07/2022 Non Reactive  Non Reactive Final   Performed at [facility], [address], [place], [place] [address]  SARSCOV2ONAVIRUS 2 AG 06/07/2022 NEGATIVE  NEGATIVE Final   Comment: (NOTE) SARS-CoV-2 antigen NOT DETECTED.   Negative results are presumptive.  Negative results do not preclude SARS-CoV-2 infection and should not be used as the sole basis for treatment or other patient management decisions, including infection  control decisions, particularly in the presence of clinical signs and  symptoms consistent with COVID-19, or in those who have been in contact with the virus.  Negative results must be combined with clinical observations, patient history, and epidemiological information. The expected result is Negative.  Fact Sheet for Patients: https://www.jennings-kim.com/  Fact Sheet for Healthcare Providers: https://alexander-rogers.biz/  This test is not yet approved or cleared by the [place] FDA and  has been authorized for detection and/or diagnosis of SARS-CoV-2 by FDA under an Emergency Use Authorization (EUA).  This EUA will remain in effect (meaning this test can be used) for the duration of  the COV                          ID-19 declaration under Section 564(b)(1) of the Act, 21 U.S.C. section 360bbb-3(b)(1), unless the authorization is terminated or revoked sooner.     POC Amphetamine UR 06/07/2022 None Detected  NONE DETECTED (Cut Off Level 1000 ng/mL) Final   POC Secobarbital (BAR) 06/07/2022 None Detected  NONE DETECTED (Cut Off Level 300 ng/mL) Final   POC Buprenorphine (BUP) 06/07/2022 None Detected  NONE DETECTED (Cut Off Level 10 ng/mL) Final   POC Oxazepam (BZO) 06/07/2022 None Detected  NONE DETECTED (Cut Off Level 300 ng/mL) Final   POC Cocaine UR 06/07/2022 None Detected  NONE DETECTED (Cut Off Level 300 ng/mL) Final   POC Methamphetamine UR 06/07/2022 None Detected  NONE DETECTED (Cut Off Level 1000 ng/mL)  Final   POC Morphine 06/07/2022 None Detected  NONE DETECTED (Cut Off Level 300 ng/mL) Final   POC Methadone UR 06/07/2022 None Detected  NONE DETECTED (Cut Off Level 300 ng/mL) Final   POC Oxycodone UR 06/07/2022 None Detected  NONE DETECTED (Cut Off Level 100 ng/mL) Final   POC Marijuana UR 06/07/2022 None Detected  NONE DETECTED (Cut Off Level 50 ng/mL) Final   Preg Test, Ur 06/08/2022 NEGATIVE  NEGATIVE Final   Comment:        THE SENSITIVITY OF THIS METHODOLOGY IS >20 mIU/mL. Performed at [facility], [address] [place], [place] [address]    Preg Test, Ur 06/08/2022 NEGATIVE  NEGATIVE Final   Comment:        THE SENSITIVITY OF THIS METHODOLOGY IS >24 mIU/mL     Blood Alcohol level:  Lab Results  Component Value Date   ETH <10 07/04/2022   ETH <10 02/07/2022    Metabolic Disorder Labs: Lab Results  Component Value Date   HGBA1C 5.0 06/07/2022   MPG 96.8 06/07/2022   MPG 96.8 02/07/2022   No results found for: "PROLACTIN" Lab Results  Component Value Date   CHOL 181 07/25/2022   TRIG 118 07/25/2022   HDL 45 07/25/2022   CHOLHDL 4.0 07/25/2022   VLDL 24 07/25/2022   LDLCALC 112 (H) 07/25/2022   LDLCALC 88 06/07/2022    Therapeutic Lab Levels: No results found for: "LITHIUM" No results found for: "VALPROATE" No results found for: "CBMZ"  Physical Findings   GAD-7    Flowsheet Row Office Visit [facility] 12/17/2021 in [facility]  Total GAD-7 Score 17  [id number]    Flowsheet Row ED from 06/07/2022 in [facility] Office Visit from 12/17/2021 in [facility]  PHQ-2 Total Score 1 2  PHQ-9 Total Score 2 8      Flowsheet Row ED from 07/25/2022 in [facility] ED from 07/04/2022 in [facility] ED from 06/14/2022 in [facility]   C-SSRS RISK CATEGORY High Risk No Risk No Risk         Musculoskeletal  Strength & Muscle Tone: within normal limits Gait & Station: normal Patient leans: N/A  Psychiatric Specialty Exam  Presentation  General Appearance:  Appropriate for Environment  Eye Contact: Good  Speech: Clear and Coherent; Normal Rate  Speech Volume: Normal  Handedness: Right   Mood and Affect  Mood: Euthymic  Affect: Appropriate; Congruent   Thought Process  Thought Processes: Coherent  Descriptions of Associations:Intact  Orientation:Full (Time, Place and Person)  Thought Content:Logical  Diagnosis of Schizophrenia or Schizoaffective disorder in past: No    Hallucinations:Hallucinations: None  Ideas of Reference:None  Suicidal Thoughts:Suicidal Thoughts: No  Homicidal Thoughts:Homicidal Thoughts: No   Sensorium  Memory: Immediate Fair  Judgment: Intact  Insight: Present   Executive Functions  Concentration: Fair  Attention Span: Fair  Recall: Fair  Fund of Knowledge: Fair  Language: Fair   Psychomotor Activity  Psychomotor Activity: Psychomotor Activity: Normal   Assets  Assets: Desire for Improvement; Communication Skills   Sleep  Sleep: Sleep: Good   No data recorded  Physical Exam  Physical Exam Constitutional:      General: She is not in acute distress.    Appearance: She is not ill-appearing, toxic-appearing or diaphoretic.  Eyes:     General: No scleral icterus. Cardiovascular:     Rate and Rhythm: Normal rate.  Pulmonary:     Effort: Pulmonary effort is normal. No respiratory distress.  Neurological:     Mental Status: She is alert and oriented to person, place, and time.  Psychiatric:        Attention and Perception: Attention and perception normal.        Mood and Affect: Mood and affect normal.        Speech: Speech normal.        Behavior: Behavior normal. Behavior is cooperative.        Thought Content: Thought content normal.    Review of Systems  Constitutional:   Negative for chills and fever.  Respiratory:  Negative for shortness of breath.   Cardiovascular:  Negative for chest pain and palpitations.  Gastrointestinal:  Negative for abdominal pain.  Neurological:  Negative for headaches.   Blood pressure 91/61, pulse 97, temperature 98.2 F (36.8 C), temperature source Oral, resp. rate 15, SpO2 98 %. There is no height or weight on file to calculate BMI.  Treatment Plan Summary: Plan Pt remains psychiatrically cleared. Disposition is pending placement.  Lauree Chandler, NP 08/24/2022 10:11 AM

## 2022-08-24 NOTE — ED Notes (Addendum)
Patient received tylenol po prn due to headache pain. 9/10  1430: Pain reassessed. 4 out of 10. Patient's headache gradually improving.

## 2022-08-25 NOTE — Progress Notes (Signed)
Nicole [name]  remained visible in the milieu throughout the day, napping, eating her meals and snacking. She showered and returned to her bed. She is social with her peer.

## 2022-08-25 NOTE — ED Notes (Signed)
The patient is currently in the bathroom, no distress noted, will continue to monitor patient for safety

## 2022-08-25 NOTE — ED Notes (Signed)
Pt is in the bed watching TV. Respirations are even and unlabored. No acute distress noted. Will continue to monitor for safety.  

## 2022-08-25 NOTE — ED Provider Notes (Signed)
Behavioral Health Progress Note  Date and Time: 08/25/2022 8:43 AM Name: Nicole [name] MRN:  [id number]  Subjective:   Nicole [name] is a 31 y.o. female, with PMH of SCZ unspecified, MDD with psychosis, who presented voluntary to [facility] (07/25/2022) via [facility] after a verbal altercation with [name] (not patient's legal guardian) for transient SI.  This is patient's fourth visit to [facility] for similar concerns this year. Patient has been dismissed from previous group home due to threatening SI and HI, then leaving the group home.   Legal Guardian: [name]  On reassessment, pt is sleeping, awakens to name being called. She endorses euthymic mood. Affect is appropriate, congruent. She reports experiencing HA yesterday that resolved after taking tylenol. She denies HA or any physical complaints today. Yesterday, she had reported hearing voices, non-command to RN. When asked today, she denies AVH. We reviewed that she is not on Invega LAI. She is on Abilify Maintenna LAI. Her last dose was administered on 08/02/22. Her next dose is due 08/30/22. She verbalized understanding. She denies SI/VI/HI, AVH, paranoia.   Diagnosis:  Final diagnoses:  Severe episode of recurrent major depressive disorder, with psychotic features (HCC)    Total Time spent with patient: 15 minutes  Past Psychiatric History: SCZ unspecified, MDD with psychosis, Anxiety Past Medical History:  Past Medical History:  Diagnosis Date   Anxiety    Depression    Hypothyroidism 08/07/2022   Tobacco use disorder 08/07/2022    Past Surgical History:  Procedure Laterality Date   WISDOM TOOTH EXTRACTION Bilateral 2020   Family History:  Family History  Problem Relation Age of Onset   Hypertension Father    Diabetes Father    Family Psychiatric  History: None reported Social History:  Social History   Substance and Sexual Activity   Alcohol Use Never     Social History   Substance and Sexual Activity  Drug Use Never    Social History   Socioeconomic History   Marital status: Single    Spouse name: Not on file   Number of children: Not on file   Years of education: Not on file   Highest education level: Not on file  Occupational History   Not on file  Tobacco Use   Smoking status: Every Day    Types: Cigarettes   Smokeless tobacco: Never  Vaping Use   Vaping Use: Some days  Substance and Sexual Activity   Alcohol use: Never   Drug use: Never   Sexual activity: Yes    Partners: Female    Birth control/protection: Implant  Other Topics Concern   Not on file  Social History Narrative   Not on file   Social Determinants of Health   Financial Resource Strain: Not on file  Food Insecurity: Not on file  Transportation Needs: Not on file  Physical Activity: Not on file  Stress: Not on file  Social Connections: Not on file   SDOH:  SDOH Screenings   Depression (PHQ2-9): Low Risk  (06/10/2022)  Tobacco Use: High Risk (08/07/2022)   Additional Social History:    Pain Medications: SEE MAR Prescriptions: SEE MAR Over the Counter: SEE MAR History of alcohol / drug use?: Yes Longest period of sobriety (when/how long): N/A Negative Consequences of Use:  (NONE) Withdrawal Symptoms: None Name of Substance 1: ETOH IN PAST--PT CURRENTLY DENIES 1 - Frequency: SOCIAL USE ETOH IN PAST 1 - Method  of Aquiring: LEGAL 1- Route of Use: ORAL DRINK Name of Substance 2: NICOTINE-VAPE AND CIGARETTES 2 - Amount (size/oz): LESS THAN ONE PACK 2 - Frequency: DAILY 2 - Method of Aquiring: LEGAL 2 - Route of Substance Use: ORAL SMOKE                Sleep: Good  Appetite:  Good  Current Medications:  Current Facility-Administered Medications  Medication Dose Route Frequency Provider Last Rate Last Admin   acetaminophen (TYLENOL) tablet 650 mg  650 mg Oral Q6H PRN Onuoha, Chinwendu V, NP   650 mg at  08/24/22 1336   alum & mag hydroxide-simeth (MAALOX/MYLANTA) 200-200-20 MG/5ML suspension 30 mL  30 mL Oral Q4H PRN Onuoha, Chinwendu V, NP   30 mL at 07/27/22 2151   ARIPiprazole ER (ABILIFY MAINTENA) injection 400 mg  400 mg Intramuscular Q28 days Princess Bruins, DO   400 mg at 08/02/22 1154   clotrimazole (LOTRIMIN) 1 % cream   Topical BID Bobbitt, Shalon E, NP   Given at 08/24/22 2110   hydrOXYzine (ATARAX) tablet 25 mg  25 mg Oral TID PRN Onuoha, Chinwendu V, NP   25 mg at 08/23/22 1000   ibuprofen (ADVIL) tablet 400 mg  400 mg Oral Q6H PRN Princess Bruins, DO   400 mg at 08/22/22 2027   levothyroxine (SYNTHROID) tablet 100 mcg  100 mcg Oral Q0600 Princess Bruins, DO   100 mcg at 08/25/22 0542   ziprasidone (GEODON) injection 20 mg  20 mg Intramuscular Q12H PRN Onuoha, Chinwendu V, NP       And   LORazepam (ATIVAN) tablet 1 mg  1 mg Oral PRN Onuoha, Chinwendu V, NP       magnesium hydroxide (MILK OF MAGNESIA) suspension 30 mL  30 mL Oral Daily PRN Onuoha, Chinwendu V, NP   30 mL at 08/22/22 1005   nicotine (NICODERM CQ - dosed in mg/24 hours) patch 14 mg  14 mg Transdermal Q0600 Onuoha, Chinwendu V, NP   14 mg at 08/24/22 0944   Oxcarbazepine (TRILEPTAL) tablet 300 mg  300 mg Oral BID Onuoha, Chinwendu V, NP   300 mg at 08/24/22 2110   pantoprazole (PROTONIX) EC tablet 20 mg  20 mg Oral Daily Princess Bruins, DO   20 mg at 08/24/22 0943   QUEtiapine (SEROQUEL) tablet 400 mg  400 mg Oral BID Onuoha, Chinwendu V, NP   400 mg at 08/24/22 2110   senna-docusate (Senokot-S) tablet 1 tablet  1 tablet Oral QHS Princess Bruins, DO   1 tablet at 08/24/22 2110   sertraline (ZOLOFT) tablet 150 mg  150 mg Oral Daily Onuoha, Chinwendu V, NP   150 mg at 08/24/22 0943   traZODone (DESYREL) tablet 50 mg  50 mg Oral QHS PRN Onuoha, Chinwendu V, NP   50 mg at 08/24/22 2110   valACYclovir (VALTREX) tablet 500 mg  500 mg Oral Daily Princess Bruins, DO   500 mg at 08/24/22 1610   Current Outpatient Medications  Medication  Sig Dispense Refill   ABILIFY MAINTENA 400 MG PRSY prefilled syringe 400 mg every 28 (twenty-eight) days.     cetirizine (ZYRTEC) 10 MG tablet Take 10 mg by mouth daily.     cyclobenzaprine (FLEXERIL) 10 MG tablet Take 1 tablet (10 mg total) by mouth 2 (two) times daily as needed for muscle spasms. 20 tablet 0   fluticasone (FLONASE) 50 MCG/ACT nasal spray Place 1 spray into both nostrils daily.     hydrOXYzine (ATARAX)  50 MG tablet Take 1 tablet (50 mg total) by mouth 2 (two) times daily. 60 tablet 0   meloxicam (MOBIC) 15 MG tablet Take 15 mg by mouth daily.     Oxcarbazepine (TRILEPTAL) 300 MG tablet Take 1 tablet (300 mg total) by mouth 2 (two) times daily. 60 tablet 0   QUEtiapine (SEROQUEL) 400 MG tablet Take 1 tablet (400 mg total) by mouth 2 (two) times daily. 60 tablet 0   sertraline (ZOLOFT) 50 MG tablet Take 3 tablets (150 mg total) by mouth in the morning. 90 tablet 0   traZODone (DESYREL) 100 MG tablet Take 1 tablet (100 mg total) by mouth at bedtime. 30 tablet 0   valACYclovir (VALTREX) 500 MG tablet Take 500 mg by mouth daily.      Labs  Lab Results:  Admission on 07/25/2022  Component Date Value Ref Range Status   SARS Coronavirus 2 by RT PCR 07/25/2022 NEGATIVE  NEGATIVE Final   Comment: (NOTE) SARS-CoV-2 target nucleic acids are NOT DETECTED.  The SARS-CoV-2 RNA is generally detectable in upper respiratory specimens during the acute phase of infection. The lowest concentration of SARS-CoV-2 viral copies this assay can detect is 138 copies/mL. A negative result does not preclude SARS-Cov-2 infection and should not be used as the sole basis for treatment or other patient management decisions. A negative result may occur with  improper specimen collection/handling, submission of specimen other than nasopharyngeal swab, presence of viral mutation(s) within the areas targeted by this assay, and inadequate number of viral copies(<138 copies/mL). A negative result must be  combined with clinical observations, patient history, and epidemiological information. The expected result is Negative.  Fact Sheet for Patients:  BloggerCourse.comhttps://www.fda.gov/media/152166/download  Fact Sheet for Healthcare Providers:  SeriousBroker.ithttps://www.fda.gov/media/152162/download  This test is no                          t yet approved or cleared by the [place] FDA and  has been authorized for detection and/or diagnosis of SARS-CoV-2 by FDA under an Emergency Use Authorization (EUA). This EUA will remain  in effect (meaning this test can be used) for the duration of the COVID-19 declaration under Section 564(b)(1) of the Act, 21 U.S.C.section 360bbb-3(b)(1), unless the authorization is terminated  or revoked sooner.       Influenza A by PCR 07/25/2022 NEGATIVE  NEGATIVE Final   Influenza B by PCR 07/25/2022 NEGATIVE  NEGATIVE Final   Comment: (NOTE) The Xpert Xpress SARS-CoV-2/FLU/RSV plus assay is intended as an aid in the diagnosis of influenza from Nasopharyngeal swab specimens and should not be used as a sole basis for treatment. Nasal washings and aspirates are unacceptable for Xpert Xpress SARS-CoV-2/FLU/RSV testing.  Fact Sheet for Patients: BloggerCourse.comhttps://www.fda.gov/media/152166/download  Fact Sheet for Healthcare Providers: SeriousBroker.ithttps://www.fda.gov/media/152162/download  This test is not yet approved or cleared by the [place] FDA and has been authorized for detection and/or diagnosis of SARS-CoV-2 by FDA under an Emergency Use Authorization (EUA). This EUA will remain in effect (meaning this test can be used) for the duration of the COVID-19 declaration under Section 564(b)(1) of the Act, 21 U.S.C. section 360bbb-3(b)(1), unless the authorization is terminated or revoked.  Performed at [facility], [address] [place], [place] [id number]    WBC 07/25/2022 8.3  4.0 - 10.5 K/uL Final   RBC 07/25/2022 4.44  3.87 - 5.11 MIL/uL Final   Hemoglobin 07/25/2022 13.7   12.0 - 15.0 g/dL Final  HCT 07/25/2022 40.2  36.0 - 46.0 % Final   MCV 07/25/2022 90.5  80.0 - 100.0 fL Final   MCH 07/25/2022 30.9  26.0 - 34.0 pg Final   MCHC 07/25/2022 34.1  30.0 - 36.0 g/dL Final   RDW 12/45/8099 12.2  11.5 - 15.5 % Final   Platelets 07/25/2022 248  150 - 400 K/uL Final   nRBC 07/25/2022 0.0  0.0 - 0.2 % Final   Neutrophils Relative % 07/25/2022 43  % Final   Neutro Abs 07/25/2022 3.6  1.7 - 7.7 K/uL Final   Lymphocytes Relative 07/25/2022 52  % Final   Lymphs Abs 07/25/2022 4.2 (H)  0.7 - 4.0 K/uL Final   Monocytes Relative 07/25/2022 5  % Final   Monocytes Absolute 07/25/2022 0.4  0.1 - 1.0 K/uL Final   Eosinophils Relative 07/25/2022 0  % Final   Eosinophils Absolute 07/25/2022 0.0  0.0 - 0.5 K/uL Final   Basophils Relative 07/25/2022 0  % Final   Basophils Absolute 07/25/2022 0.0  0.0 - 0.1 K/uL Final   Immature Granulocytes 07/25/2022 0  % Final   Abs Immature Granulocytes 07/25/2022 0.02  0.00 - 0.07 K/uL Final   Performed at [facility], [address], [place], [place] [address]   Sodium 07/25/2022 138  135 - 145 mmol/L Final   Potassium 07/25/2022 4.0  3.5 - 5.1 mmol/L Final   Chloride 07/25/2022 104  98 - 111 mmol/L Final   CO2 07/25/2022 29  22 - 32 mmol/L Final   Glucose, Bld 07/25/2022 83  70 - 99 mg/dL Final   Glucose reference range applies only to samples taken after fasting for at least 8 hours.   BUN 07/25/2022 11  6 - 20 mg/dL Final   Creatinine, Ser 07/25/2022 0.97  0.44 - 1.00 mg/dL Final   Calcium 50/53/9767 9.2  8.9 - 10.3 mg/dL Final   Total Protein 34/19/3790 7.0  6.5 - 8.1 g/dL Final   Albumin 24/06/7352 3.8  3.5 - 5.0 g/dL Final   AST 29/92/4268 18  15 - 41 U/L Final   ALT 07/25/2022 22  0 - 44 U/L Final   Alkaline Phosphatase 07/25/2022 64  38 - 126 U/L Final   Total Bilirubin 07/25/2022 0.2 (L)  0.3 - 1.2 mg/dL Final   GFR, Estimated 07/25/2022 >60  >60 mL/min Final   Comment: (NOTE) Calculated using the CKD-EPI  Creatinine Equation (2021)    Anion gap 07/25/2022 5  5 - 15 Final   Performed at [facility], [address], [place], [place] [address]   POC Amphetamine UR 07/25/2022 None Detected  NONE DETECTED (Cut Off Level 1000 ng/mL) Preliminary   POC Secobarbital (BAR) 07/25/2022 None Detected  NONE DETECTED (Cut Off Level 300 ng/mL) Preliminary   POC Buprenorphine (BUP) 07/25/2022 None Detected  NONE DETECTED (Cut Off Level 10 ng/mL) Preliminary   POC Oxazepam (BZO) 07/25/2022 None Detected  NONE DETECTED (Cut Off Level 300 ng/mL) Preliminary   POC Cocaine UR 07/25/2022 None Detected  NONE DETECTED (Cut Off Level 300 ng/mL) Preliminary   POC Methamphetamine UR 07/25/2022 None Detected  NONE DETECTED (Cut Off Level 1000 ng/mL) Preliminary   POC Morphine 07/25/2022 None Detected  NONE DETECTED (Cut Off Level 300 ng/mL) Preliminary   POC Methadone UR 07/25/2022 None Detected  NONE DETECTED (Cut Off Level 300 ng/mL) Preliminary   POC Oxycodone UR 07/25/2022 None Detected  NONE DETECTED (Cut Off Level 100 ng/mL) Preliminary   POC  Marijuana UR 07/25/2022 None Detected  NONE DETECTED (Cut Off Level 50 ng/mL) Preliminary   SARSCOV2ONAVIRUS 2 AG 07/25/2022 NEGATIVE  NEGATIVE Final   Comment: (NOTE) SARS-CoV-2 antigen NOT DETECTED.   Negative results are presumptive.  Negative results do not preclude SARS-CoV-2 infection and should not be used as the sole basis for treatment or other patient management decisions, including infection  control decisions, particularly in the presence of clinical signs and  symptoms consistent with COVID-19, or in those who have been in contact with the virus.  Negative results must be combined with clinical observations, patient history, and epidemiological information. The expected result is Negative.  Fact Sheet for Patients: https://www.jennings-kim.com/  Fact Sheet for Healthcare Providers: https://alexander-rogers.biz/  This test is  not yet approved or cleared by the [place] FDA and  has been authorized for detection and/or diagnosis of SARS-CoV-2 by FDA under an Emergency Use Authorization (EUA).  This EUA will remain in effect (meaning this test can be used) for the duration of  the COV                          ID-19 declaration under Section 564(b)(1) of the Act, 21 U.S.C. section 360bbb-3(b)(1), unless the authorization is terminated or revoked sooner.     Cholesterol 07/25/2022 181  0 - 200 mg/dL Final   Triglycerides 16/07/9603 118  <150 mg/dL Final   HDL 54/06/8118 45  >40 mg/dL Final   Total CHOL/HDL Ratio 07/25/2022 4.0  RATIO Final   VLDL 07/25/2022 24  0 - 40 mg/dL Final   LDL Cholesterol 07/25/2022 112 (H)  0 - 99 mg/dL Final   Comment:        Total Cholesterol/HDL:CHD Risk Coronary Heart Disease Risk Table                     Men   Women  1/2 Average Risk   3.4   3.3  Average Risk       5.0   4.4  2 X Average Risk   9.6   7.1  3 X Average Risk  23.4   11.0        Use the calculated Patient Ratio above and the CHD Risk Table to determine the patient's CHD Risk.        ATP III CLASSIFICATION (LDL):  <100     mg/dL   Optimal  147-829  mg/dL   Near or Above                    Optimal  130-159  mg/dL   Borderline  562-130  mg/dL   High  >865     mg/dL   Very High Performed at [facility], [address], [place], [place] [address]    TSH 07/25/2022 6.668 (H)  0.350 - 4.500 uIU/mL Final   Comment: Performed by a 3rd Generation assay with a functional sensitivity of <=0.01 uIU/mL. Performed at [facility], [address] [name], [place] [address]    Glucose-Capillary 07/26/2022 104 (H)  70 - 99 mg/dL Final   Glucose reference range applies only to samples taken after fasting for at least 8 hours.   T3, Free 07/31/2022 2.3  2.0 - 4.4 pg/mL Final   Comment: (NOTE) Performed At: [facility] [address] [place], [place] [address] Jolene Schimke MD  UU:[id number]    Free T4 07/31/2022 0.60 (L)  0.61 -  1.12 ng/dL Final   Comment: (NOTE) Biotin ingestion may interfere with free T4 tests. If the results are inconsistent with the TSH level, previous test results, or the clinical presentation, then consider biotin interference. If needed, order repeat testing after stopping biotin. Performed at [facility], [address] [place], [place] [address]   Admission on 07/04/2022, Discharged on 07/05/2022  Component Date Value Ref Range Status   Sodium 07/04/2022 142  135 - 145 mmol/L Final   Potassium 07/04/2022 4.4  3.5 - 5.1 mmol/L Final   Chloride 07/04/2022 109  98 - 111 mmol/L Final   CO2 07/04/2022 26  22 - 32 mmol/L Final   Glucose, Bld 07/04/2022 96  70 - 99 mg/dL Final   Glucose reference range applies only to samples taken after fasting for at least 8 hours.   BUN 07/04/2022 15  6 - 20 mg/dL Final   Creatinine, Ser 07/04/2022 0.83  0.44 - 1.00 mg/dL Final   Calcium 86/57/8469 9.3  8.9 - 10.3 mg/dL Final   Total Protein 62/95/2841 7.2  6.5 - 8.1 g/dL Final   Albumin 32/44/0102 3.9  3.5 - 5.0 g/dL Final   AST 72/53/6644 23  15 - 41 U/L Final   ALT 07/04/2022 28  0 - 44 U/L Final   Alkaline Phosphatase 07/04/2022 77  38 - 126 U/L Final   Total Bilirubin 07/04/2022 0.4  0.3 - 1.2 mg/dL Final   GFR, Estimated 07/04/2022 >60  >60 mL/min Final   Comment: (NOTE) Calculated using the CKD-EPI Creatinine Equation (2021)    Anion gap 07/04/2022 7  5 - 15 Final   Performed at [facility], [address], [place], [place] [address]   Alcohol, Ethyl (B) 07/04/2022 <10  <10 mg/dL Final   Comment: (NOTE) Lowest detectable limit for serum alcohol is 10 mg/dL.  For medical purposes only. Performed at [facility], [address], [place], [place] [address]    WBC 07/04/2022 7.0  4.0 - 10.5 K/uL Final   RBC 07/04/2022 4.18  3.87 - 5.11 MIL/uL Final   Hemoglobin 07/04/2022 12.8  12.0 - 15.0  g/dL Final   HCT 38/75/6433 39.1  36.0 - 46.0 % Final   MCV 07/04/2022 93.5  80.0 - 100.0 fL Final   MCH 07/04/2022 30.6  26.0 - 34.0 pg Final   MCHC 07/04/2022 32.7  30.0 - 36.0 g/dL Final   RDW 29/51/8841 12.9  11.5 - 15.5 % Final   Platelets 07/04/2022 243  150 - 400 K/uL Final   nRBC 07/04/2022 0.0  0.0 - 0.2 % Final   Neutrophils Relative % 07/04/2022 43  % Final   Neutro Abs 07/04/2022 3.0  1.7 - 7.7 K/uL Final   Lymphocytes Relative 07/04/2022 50  % Final   Lymphs Abs 07/04/2022 3.5  0.7 - 4.0 K/uL Final   Monocytes Relative 07/04/2022 7  % Final   Monocytes Absolute 07/04/2022 0.5  0.1 - 1.0 K/uL Final   Eosinophils Relative 07/04/2022 0  % Final   Eosinophils Absolute 07/04/2022 0.0  0.0 - 0.5 K/uL Final   Basophils Relative 07/04/2022 0  % Final   Basophils Absolute 07/04/2022 0.0  0.0 - 0.1 K/uL Final   Immature Granulocytes 07/04/2022 0  % Final   Abs Immature Granulocytes 07/04/2022 0.01  0.00 - 0.07 K/uL Final   Performed at [facility], [address], [place], [place] [address]   I-stat hCG, quantitative 07/04/2022 <5.0  <  5 mIU/mL Final   Comment 3 07/04/2022          Final   Comment:   GEST. AGE      CONC.  (mIU/mL)   <=1 WEEK        5 - 50     2 WEEKS       50 - 500     3 WEEKS       100 - 10,000     4 WEEKS     1,000 - 30,000        FEMALE AND NON-PREGNANT FEMALE:     LESS THAN 5 mIU/mL   Admission on 06/14/2022, Discharged on 06/14/2022  Component Date Value Ref Range Status   Color, UA 06/14/2022 yellow  yellow Final   Clarity, UA 06/14/2022 cloudy (A)  clear Final   Glucose, UA 06/14/2022 negative  negative mg/dL Final   Bilirubin, UA 16/07/9603 negative  negative Final   Ketones, POC UA 06/14/2022 negative  negative mg/dL Final   Spec Grav, UA 54/06/8118 1.025  1.010 - 1.025 Final   Blood, UA 06/14/2022 negative  negative Final   pH, UA 06/14/2022 7.5  5.0 - 8.0 Final   Protein Ur, POC 06/14/2022 =30 (A)  negative mg/dL Final    Urobilinogen, UA 06/14/2022 0.2  0.2 or 1.0 E.U./dL Final   Nitrite, UA 14/78/2956 Negative  Negative Final   Leukocytes, UA 06/14/2022 Negative  Negative Final   Preg Test, Ur 06/14/2022 Negative  Negative Final   Specimen Description 06/14/2022 URINE, CLEAN CATCH   Final   Special Requests 06/14/2022    Final                   Value:NONE Performed at [facility], [address], [place], [place] [address]    Culture 06/14/2022 MULTIPLE SPECIES PRESENT, SUGGEST RECOLLECTION (A)   Final   Report Status 06/14/2022 06/16/2022 FINAL   Final  Admission on 06/07/2022, Discharged on 06/10/2022  Component Date Value Ref Range Status   SARS Coronavirus 2 by RT PCR 06/07/2022 NEGATIVE  NEGATIVE Final   Comment: (NOTE) SARS-CoV-2 target nucleic acids are NOT DETECTED.  The SARS-CoV-2 RNA is generally detectable in upper respiratory specimens during the acute phase of infection. The lowest concentration of SARS-CoV-2 viral copies this assay can detect is 138 copies/mL. A negative result does not preclude SARS-Cov-2 infection and should not be used as the sole basis for treatment or other patient management decisions. A negative result may occur with  improper specimen collection/handling, submission of specimen other than nasopharyngeal swab, presence of viral mutation(s) within the areas targeted by this assay, and inadequate number of viral copies(<138 copies/mL). A negative result must be combined with clinical observations, patient history, and epidemiological information. The expected result is Negative.  Fact Sheet for Patients:  BloggerCourse.com  Fact Sheet for Healthcare Providers:  SeriousBroker.it  This test is no                          t yet approved or cleared by the [place] FDA and  has been authorized for detection and/or diagnosis of SARS-CoV-2 by FDA under an Emergency Use Authorization (EUA). This EUA will  remain  in effect (meaning this test can be used) for the duration of the COVID-19 declaration under Section 564(b)(1) of the Act, 21 U.S.C.section 360bbb-3(b)(1), unless the authorization is terminated  or revoked sooner.  Influenza A by PCR 06/07/2022 NEGATIVE  NEGATIVE Final   Influenza B by PCR 06/07/2022 NEGATIVE  NEGATIVE Final   Comment: (NOTE) The Xpert Xpress SARS-CoV-2/FLU/RSV plus assay is intended as an aid in the diagnosis of influenza from Nasopharyngeal swab specimens and should not be used as a sole basis for treatment. Nasal washings and aspirates are unacceptable for Xpert Xpress SARS-CoV-2/FLU/RSV testing.  Fact Sheet for Patients: BloggerCourse.com  Fact Sheet for Healthcare Providers: SeriousBroker.it  This test is not yet approved or cleared by the [place] FDA and has been authorized for detection and/or diagnosis of SARS-CoV-2 by FDA under an Emergency Use Authorization (EUA). This EUA will remain in effect (meaning this test can be used) for the duration of the COVID-19 declaration under Section 564(b)(1) of the Act, 21 U.S.C. section 360bbb-3(b)(1), unless the authorization is terminated or revoked.  Performed at [facility], [address], [place], [place] [address]    WBC 06/07/2022 5.7  4.0 - 10.5 K/uL Final   RBC 06/07/2022 4.39  3.87 - 5.11 MIL/uL Final   Hemoglobin 06/07/2022 13.2  12.0 - 15.0 g/dL Final   HCT 60/45/4098 40.4  36.0 - 46.0 % Final   MCV 06/07/2022 92.0  80.0 - 100.0 fL Final   MCH 06/07/2022 30.1  26.0 - 34.0 pg Final   MCHC 06/07/2022 32.7  30.0 - 36.0 g/dL Final   RDW 11/91/4782 12.4  11.5 - 15.5 % Final   Platelets 06/07/2022 308  150 - 400 K/uL Final   nRBC 06/07/2022 0.0  0.0 - 0.2 % Final   Neutrophils Relative % 06/07/2022 42  % Final   Neutro Abs 06/07/2022 2.4  1.7 - 7.7 K/uL Final   Lymphocytes Relative 06/07/2022 54  % Final   Lymphs Abs  06/07/2022 3.1  0.7 - 4.0 K/uL Final   Monocytes Relative 06/07/2022 4  % Final   Monocytes Absolute 06/07/2022 0.2  0.1 - 1.0 K/uL Final   Eosinophils Relative 06/07/2022 0  % Final   Eosinophils Absolute 06/07/2022 0.0  0.0 - 0.5 K/uL Final   Basophils Relative 06/07/2022 0  % Final   Basophils Absolute 06/07/2022 0.0  0.0 - 0.1 K/uL Final   Immature Granulocytes 06/07/2022 0  % Final   Abs Immature Granulocytes 06/07/2022 0.01  0.00 - 0.07 K/uL Final   Performed at [facility], [address] [place], [place] [address]   Sodium 06/07/2022 139  135 - 145 mmol/L Final   Potassium 06/07/2022 4.0  3.5 - 5.1 mmol/L Final   Chloride 06/07/2022 104  98 - 111 mmol/L Final   CO2 06/07/2022 28  22 - 32 mmol/L Final   Glucose, Bld 06/07/2022 104 (H)  70 - 99 mg/dL Final   Glucose reference range applies only to samples taken after fasting for at least 8 hours.   BUN 06/07/2022 8  6 - 20 mg/dL Final   Creatinine, Ser 06/07/2022 0.84  0.44 - 1.00 mg/dL Final   Calcium 30/86/5784 9.1  8.9 - 10.3 mg/dL Final   Total Protein 69/62/9528 6.9  6.5 - 8.1 g/dL Final   Albumin 41/32/4401 3.7  3.5 - 5.0 g/dL Final   AST 02/72/5366 19  15 - 41 U/L Final   ALT 06/07/2022 22  0 - 44 U/L Final   Alkaline Phosphatase 06/07/2022 54  38 - 126 U/L Final   Total Bilirubin 06/07/2022 0.4  0.3 - 1.2 mg/dL Final   GFR, Estimated 06/07/2022 >60  >  60 mL/min Final   Comment: (NOTE) Calculated using the CKD-EPI Creatinine Equation (2021)    Anion gap 06/07/2022 7  5 - 15 Final   Performed at [facility], [address] [place], [place] [address]   Hgb A1c MFr Bld 06/07/2022 5.0  4.8 - 5.6 % Final   Comment: (NOTE) Pre diabetes:          5.7%-6.4%  Diabetes:              >6.4%  Glycemic control for   <7.0% adults with diabetes    Mean Plasma Glucose 06/07/2022 96.8  mg/dL Final   Performed at [facility], [address], [place], [place] [address]   TSH 06/07/2022 1.620  0.350 - 4.500  uIU/mL Final   Comment: Performed by a 3rd Generation assay with a functional sensitivity of <=0.01 uIU/mL. Performed at [facility], [address], [place], [place] [address]    RPR Ser Ql 06/07/2022 NON REACTIVE  NON REACTIVE Final   Performed at [facility], [address] [place], [place] [address]   Color, Urine 06/07/2022 YELLOW  YELLOW Final   APPearance 06/07/2022 HAZY (A)  CLEAR Final   Specific Gravity, Urine 06/07/2022 1.018  1.005 - 1.030 Final   pH 06/07/2022 7.0  5.0 - 8.0 Final   Glucose, UA 06/07/2022 NEGATIVE  NEGATIVE mg/dL Final   Hgb urine dipstick 06/07/2022 NEGATIVE  NEGATIVE Final   Bilirubin Urine 06/07/2022 NEGATIVE  NEGATIVE Final   Ketones, ur 06/07/2022 NEGATIVE  NEGATIVE mg/dL Final   Protein, ur 65/78/4696 NEGATIVE  NEGATIVE mg/dL Final   Nitrite 29/52/8413 NEGATIVE  NEGATIVE Final   Leukocytes,Ua 06/07/2022 NEGATIVE  NEGATIVE Final   Performed at [facility], [address], [place], [place] [address]   Cholesterol 06/07/2022 164  0 - 200 mg/dL Final   Triglycerides 02/72/5366 158 (H)  <150 mg/dL Final   HDL 44/12/4740 44  >40 mg/dL Final   Total CHOL/HDL Ratio 06/07/2022 3.7  RATIO Final   VLDL 06/07/2022 32  0 - 40 mg/dL Final   LDL Cholesterol 06/07/2022 88  0 - 99 mg/dL Final   Comment:        Total Cholesterol/HDL:CHD Risk Coronary Heart Disease Risk Table                     Men   Women  1/2 Average Risk   3.4   3.3  Average Risk       5.0   4.4  2 X Average Risk   9.6   7.1  3 X Average Risk  23.4   11.0        Use the calculated Patient Ratio above and the CHD Risk Table to determine the patient's CHD Risk.        ATP III CLASSIFICATION (LDL):  <100     mg/dL   Optimal  595-638  mg/dL   Near or Above                    Optimal  130-159  mg/dL   Borderline  756-433  mg/dL   High  >295     mg/dL   Very High Performed at [facility], [address], [place], [place] [address]    HIV Screen 4th Generation  wRfx 06/07/2022 Non Reactive  Non Reactive Final   Performed at [facility], [address], [place], [place] [address]   SARSCOV2ONAVIRUS 2 AG 06/07/2022 NEGATIVE  NEGATIVE Final   Comment: (NOTE) SARS-CoV-2 antigen NOT DETECTED.   Negative results are presumptive.  Negative results do not preclude SARS-CoV-2 infection and should not be used as the sole basis for treatment or other patient management decisions, including infection  control decisions, particularly in the presence of clinical signs and  symptoms consistent with COVID-19, or in those who have been in contact with the virus.  Negative results must be combined with clinical observations, patient history, and epidemiological information. The expected result is Negative.  Fact Sheet for Patients: https://www.jennings-kim.com/  Fact Sheet for Healthcare Providers: https://alexander-rogers.biz/  This test is not yet approved or cleared by the [place] FDA and  has been authorized for detection and/or diagnosis of SARS-CoV-2 by FDA under an Emergency Use Authorization (EUA).  This EUA will remain in effect (meaning this test can be used) for the duration of  the COV                          ID-19 declaration under Section 564(b)(1) of the Act, 21 U.S.C. section 360bbb-3(b)(1), unless the authorization is terminated or revoked sooner.     POC Amphetamine UR 06/07/2022 None Detected  NONE DETECTED (Cut Off Level 1000 ng/mL) Final   POC Secobarbital (BAR) 06/07/2022 None Detected  NONE DETECTED (Cut Off Level 300 ng/mL) Final   POC Buprenorphine (BUP) 06/07/2022 None Detected  NONE DETECTED (Cut Off Level 10 ng/mL) Final   POC Oxazepam (BZO) 06/07/2022 None Detected  NONE DETECTED (Cut Off Level 300 ng/mL) Final   POC Cocaine UR 06/07/2022 None Detected  NONE DETECTED (Cut Off Level 300 ng/mL) Final   POC Methamphetamine UR 06/07/2022 None Detected  NONE DETECTED (Cut Off Level 1000 ng/mL)  Final   POC Morphine 06/07/2022 None Detected  NONE DETECTED (Cut Off Level 300 ng/mL) Final   POC Methadone UR 06/07/2022 None Detected  NONE DETECTED (Cut Off Level 300 ng/mL) Final   POC Oxycodone UR 06/07/2022 None Detected  NONE DETECTED (Cut Off Level 100 ng/mL) Final   POC Marijuana UR 06/07/2022 None Detected  NONE DETECTED (Cut Off Level 50 ng/mL) Final   Preg Test, Ur 06/08/2022 NEGATIVE  NEGATIVE Final   Comment:        THE SENSITIVITY OF THIS METHODOLOGY IS >20 mIU/mL. Performed at [facility], [address] [place], [place] [address]    Preg Test, Ur 06/08/2022 NEGATIVE  NEGATIVE Final   Comment:        THE SENSITIVITY OF THIS METHODOLOGY IS >24 mIU/mL     Blood Alcohol level:  Lab Results  Component Value Date   ETH <10 07/04/2022   ETH <10 02/07/2022    Metabolic Disorder Labs: Lab Results  Component Value Date   HGBA1C 5.0 06/07/2022   MPG 96.8 06/07/2022   MPG 96.8 02/07/2022   No results found for: "PROLACTIN" Lab Results  Component Value Date   CHOL 181 07/25/2022   TRIG 118 07/25/2022   HDL 45 07/25/2022   CHOLHDL 4.0 07/25/2022   VLDL 24 07/25/2022   LDLCALC 112 (H) 07/25/2022   LDLCALC 88 06/07/2022    Therapeutic Lab Levels: No results found for: "LITHIUM" No results found for: "VALPROATE" No results found for: "CBMZ"  Physical Findings   GAD-7    Flowsheet Row Office Visit from 12/17/2021 in [facility]  Total GAD-7 Score  17      PHQ2-9    Flowsheet Row ED from 06/07/2022 in [facility] Office Visit from 12/17/2021 in [facility]  PHQ-2 Total Score 1 2  PHQ-9 Total Score 2 8      Flowsheet Row ED from 07/25/2022 in [facility] ED from 07/04/2022 in [facility] ED from 06/14/2022 in [facility]   C-SSRS RISK CATEGORY High Risk No Risk No Risk         Musculoskeletal  Strength & Muscle Tone: within normal limits Gait & Station: normal Patient leans: N/A  Psychiatric Specialty Exam  Presentation  General Appearance:  Appropriate for Environment  Eye Contact: Good  Speech: Clear and Coherent; Normal Rate  Speech Volume: Normal  Handedness: Right   Mood and Affect  Mood: Euthymic  Affect: Appropriate; Congruent   Thought Process  Thought Processes: Coherent  Descriptions of Associations:Intact  Orientation:Full (Time, Place and Person)  Thought Content:Logical  Diagnosis of Schizophrenia or Schizoaffective disorder in past: No    Hallucinations:Hallucinations: None  Ideas of Reference:None  Suicidal Thoughts:Suicidal Thoughts: No  Homicidal Thoughts:Homicidal Thoughts: No   Sensorium  Memory: Immediate Fair  Judgment: Intact  Insight: Present   Executive Functions  Concentration: Fair  Attention Span: Fair  Recall: [employer] of Knowledge: Fair  Language: Fair   Psychomotor Activity  Psychomotor Activity: Psychomotor Activity: Normal   Assets  Assets: Communication Skills; Desire for Improvement   Sleep  Sleep: Sleep: Good   No data recorded  Physical Exam  Physical Exam Constitutional:      General: She is not in acute distress.    Appearance: She is not ill-appearing, toxic-appearing or diaphoretic.  Eyes:     General: No scleral icterus. Cardiovascular:     Rate and Rhythm: Normal rate.  Pulmonary:     Effort: Pulmonary effort is normal. No respiratory distress.  Neurological:     Mental Status: She is alert and oriented to person, place, and time.  Psychiatric:        Attention and Perception: Attention and perception normal.        Mood and Affect: Mood and affect normal.        Speech: Speech normal.        Behavior: Behavior normal.        Thought Content: Thought content normal.    Review of Systems  Constitutional:  Negative for chills and  fever.  Respiratory:  Negative for shortness of breath.   Cardiovascular:  Negative for chest pain and palpitations.  Gastrointestinal:  Negative for abdominal pain.  Neurological:  Negative for headaches.   Blood pressure 104/74, pulse 99, temperature 97.6 F (36.4 C), temperature source Oral, resp. rate 20, SpO2 98 %. There is no height or weight on file to calculate BMI.  Treatment Plan Summary: Plan Pt remains psychiatrically cleared. Disposition is pending placement.  Lauree Chandler, NP 08/25/2022 8:43 AM

## 2022-08-25 NOTE — ED Notes (Signed)
Pt resting quietly with eyes closed.  No pain or discomfort noted/voiced.  Breathing is even and unlabored.  Will continue to monitor for safety.  

## 2022-08-25 NOTE — Progress Notes (Signed)
Received Nicole [name] this AM asleep in her chair bed,she slept in late, received breakfast and was compliant with her medications. She returned to her bed afterwards.

## 2022-08-26 ENCOUNTER — Encounter (HOSPITAL_COMMUNITY): Payer: Self-pay | Admitting: Registered Nurse

## 2022-08-26 MED ORDER — ONDANSETRON 4 MG PO TBDP
4.0000 mg | ORAL_TABLET | Freq: Three times a day (TID) | ORAL | Status: DC | PRN
  Administered 2022-08-26 – 2022-09-30 (×9): 4 mg via ORAL
  Filled 2022-08-26 (×9): qty 1

## 2022-08-26 NOTE — ED Notes (Signed)
Pt is sleeping. No distress noted. Will continue to monitor safety. 

## 2022-08-26 NOTE — ED Provider Notes (Signed)
Behavioral Health Progress Note  Date and Time: 08/26/2022 9:30 AM Name: Nicole [name] MRN:  [id number]  Subjective:   Nicole [name] is a 31 y.o. female, with PMH of SCZ unspecified, MDD with psychosis, who presented voluntary to [facility] (07/25/2022) via [facility] after a verbal altercation with [name] (not patient's legal guardian) for transient SI.  This is patient's fourth visit to [facility] for similar concerns this year. Patient has been dismissed from previous group home due to threatening SI and HI, then leaving the group home.   Legal Guardian: [name]  [name], 56 y.o., female patient seen face to face by this provider, consulted with Dr. Nelly Rout; and chart reviewed on 08/26/22.  On evaluation Nicole [name] reports she feels fine.  Reports she is eating and sleeping without difficulty.  She denies suicidal/self-harm/homicidal ideation, psychosis, and paranoia.   During evaluation Nicole [name] is lying in bed with no noted distress.  She is alert, oriented x 4, calm, cooperative and attentive.  Her mood is euthymic with congruent affect.  She has normal speech, and behavior.  Objectively there is no evidence of psychosis/mania or delusional thinking.  Patient is able to converse coherently, goal directed thoughts, no distractibility, or pre-occupation.  She also denies suicidal/self-harm/homicidal ideation, psychosis, and paranoia.  Patient answered question appropriately.  Reconfirms she is taking Abilify Maintena.  Also discussed next dose due 08/30/22  Diagnosis:  Final diagnoses:  Severe episode of recurrent major depressive disorder, with psychotic features (HCC)    Total Time spent with patient: 15 minutes  Past Psychiatric History: SCZ unspecified, MDD with psychosis, Anxiety Past Medical History:  Past Medical History:  Diagnosis Date   Anxiety    Depression    Hypothyroidism  08/07/2022   Tobacco use disorder 08/07/2022    Past Surgical History:  Procedure Laterality Date   WISDOM TOOTH EXTRACTION Bilateral 2020   Family History:  Family History  Problem Relation Age of Onset   Hypertension Father    Diabetes Father    Family Psychiatric  History: None reported Social History:  Social History   Substance and Sexual Activity  Alcohol Use Never     Social History   Substance and Sexual Activity  Drug Use Never    Social History   Socioeconomic History   Marital status: Single    Spouse name: Not on file   Number of children: Not on file   Years of education: Not on file   Highest education level: Not on file  Occupational History   Not on file  Tobacco Use   Smoking status: Every Day    Types: Cigarettes   Smokeless tobacco: Never  Vaping Use   Vaping Use: Some days  Substance and Sexual Activity   Alcohol use: Never   Drug use: Never   Sexual activity: Yes    Partners: Female    Birth control/protection: Implant  Other Topics Concern   Not on file  Social History Narrative   Not on file   Social Determinants of Health   Financial Resource Strain: Not on file  Food Insecurity: Not on file  Transportation Needs: Not on file  Physical Activity: Not on file  Stress: Not on file  Social Connections: Not on file   SDOH:  SDOH Screenings   Depression (PHQ2-9): Low Risk  (06/10/2022)  Tobacco Use: High Risk (08/26/2022)   Additional Social History:    Pain  Medications: SEE MAR Prescriptions: SEE MAR Over the Counter: SEE MAR History of alcohol / drug use?: Yes Longest period of sobriety (when/how long): N/A Negative Consequences of Use:  (NONE) Withdrawal Symptoms: None Name of Substance 1: ETOH IN PAST--PT CURRENTLY DENIES 1 - Frequency: SOCIAL USE ETOH IN PAST 1 - Method of Aquiring: LEGAL 1- Route of Use: ORAL DRINK Name of Substance 2: NICOTINE-VAPE AND CIGARETTES 2 - Amount (size/oz): LESS THAN ONE PACK 2 -  Frequency: DAILY 2 - Method of Aquiring: LEGAL 2 - Route of Substance Use: ORAL SMOKE                Sleep: Good  Appetite:  Good  Current Medications:  Current Facility-Administered Medications  Medication Dose Route Frequency Provider Last Rate Last Admin   acetaminophen (TYLENOL) tablet 650 mg  650 mg Oral Q6H PRN Onuoha, Chinwendu V, NP   650 mg at 08/24/22 1336   alum & mag hydroxide-simeth (MAALOX/MYLANTA) 200-200-20 MG/5ML suspension 30 mL  30 mL Oral Q4H PRN Onuoha, Chinwendu V, NP   30 mL at 07/27/22 2151   ARIPiprazole ER (ABILIFY MAINTENA) injection 400 mg  400 mg Intramuscular Q28 days Princess Bruins, DO   400 mg at 08/02/22 1154   clotrimazole (LOTRIMIN) 1 % cream   Topical BID Bobbitt, Shalon E, NP   1 Application at 08/25/22 2121   hydrOXYzine (ATARAX) tablet 25 mg  25 mg Oral TID PRN Onuoha, Chinwendu V, NP   25 mg at 08/23/22 1000   ibuprofen (ADVIL) tablet 400 mg  400 mg Oral Q6H PRN Princess Bruins, DO   400 mg at 08/22/22 2027   levothyroxine (SYNTHROID) tablet 100 mcg  100 mcg Oral Q0600 Princess Bruins, DO   100 mcg at 08/26/22 0555   ziprasidone (GEODON) injection 20 mg  20 mg Intramuscular Q12H PRN Onuoha, Chinwendu V, NP       And   LORazepam (ATIVAN) tablet 1 mg  1 mg Oral PRN Onuoha, Chinwendu V, NP       magnesium hydroxide (MILK OF MAGNESIA) suspension 30 mL  30 mL Oral Daily PRN Onuoha, Chinwendu V, NP   30 mL at 08/22/22 1005   nicotine (NICODERM CQ - dosed in mg/24 hours) patch 14 mg  14 mg Transdermal Q0600 Onuoha, Chinwendu V, NP   14 mg at 08/25/22 1029   Oxcarbazepine (TRILEPTAL) tablet 300 mg  300 mg Oral BID Onuoha, Chinwendu V, NP   300 mg at 08/25/22 2120   pantoprazole (PROTONIX) EC tablet 20 mg  20 mg Oral Daily Princess Bruins, DO   20 mg at 08/25/22 1027   QUEtiapine (SEROQUEL) tablet 400 mg  400 mg Oral BID Onuoha, Chinwendu V, NP   400 mg at 08/25/22 2120   senna-docusate (Senokot-S) tablet 1 tablet  1 tablet Oral QHS Princess Bruins, DO   1  tablet at 08/25/22 2120   sertraline (ZOLOFT) tablet 150 mg  150 mg Oral Daily Onuoha, Chinwendu V, NP   150 mg at 08/25/22 1027   traZODone (DESYREL) tablet 50 mg  50 mg Oral QHS PRN Onuoha, Chinwendu V, NP   50 mg at 08/25/22 2120   valACYclovir (VALTREX) tablet 500 mg  500 mg Oral Daily Princess Bruins, DO   500 mg at 08/25/22 1027   Current Outpatient Medications  Medication Sig Dispense Refill   ABILIFY MAINTENA 400 MG PRSY prefilled syringe 400 mg every 28 (twenty-eight) days.     cetirizine (ZYRTEC) 10 MG tablet Take  10 mg by mouth daily.     cyclobenzaprine (FLEXERIL) 10 MG tablet Take 1 tablet (10 mg total) by mouth 2 (two) times daily as needed for muscle spasms. 20 tablet 0   fluticasone (FLONASE) 50 MCG/ACT nasal spray Place 1 spray into both nostrils daily.     meloxicam (MOBIC) 15 MG tablet Take 15 mg by mouth daily.     Oxcarbazepine (TRILEPTAL) 300 MG tablet Take 1 tablet (300 mg total) by mouth 2 (two) times daily. 60 tablet 0   QUEtiapine (SEROQUEL) 400 MG tablet Take 1 tablet (400 mg total) by mouth 2 (two) times daily. 60 tablet 0   sertraline (ZOLOFT) 50 MG tablet Take 3 tablets (150 mg total) by mouth in the morning. 90 tablet 0   traZODone (DESYREL) 100 MG tablet Take 1 tablet (100 mg total) by mouth at bedtime. 30 tablet 0   valACYclovir (VALTREX) 500 MG tablet Take 500 mg by mouth daily.      Labs  Lab Results:  Admission on 07/25/2022  Component Date Value Ref Range Status   SARS Coronavirus 2 by RT PCR 07/25/2022 NEGATIVE  NEGATIVE Final   Comment: (NOTE) SARS-CoV-2 target nucleic acids are NOT DETECTED.  The SARS-CoV-2 RNA is generally detectable in upper respiratory specimens during the acute phase of infection. The lowest concentration of SARS-CoV-2 viral copies this assay can detect is 138 copies/mL. A negative result does not preclude SARS-Cov-2 infection and should not be used as the sole basis for treatment or other patient management decisions. A  negative result may occur with  improper specimen collection/handling, submission of specimen other than nasopharyngeal swab, presence of viral mutation(s) within the areas targeted by this assay, and inadequate number of viral copies(<138 copies/mL). A negative result must be combined with clinical observations, patient history, and epidemiological information. The expected result is Negative.  Fact Sheet for Patients:  BloggerCourse.com  Fact Sheet for Healthcare Providers:  SeriousBroker.it  This test is no                          t yet approved or cleared by the [place] FDA and  has been authorized for detection and/or diagnosis of SARS-CoV-2 by FDA under an Emergency Use Authorization (EUA). This EUA will remain  in effect (meaning this test can be used) for the duration of the COVID-19 declaration under Section 564(b)(1) of the Act, 21 U.S.C.section 360bbb-3(b)(1), unless the authorization is terminated  or revoked sooner.       Influenza A by PCR 07/25/2022 NEGATIVE  NEGATIVE Final   Influenza B by PCR 07/25/2022 NEGATIVE  NEGATIVE Final   Comment: (NOTE) The Xpert Xpress SARS-CoV-2/FLU/RSV plus assay is intended as an aid in the diagnosis of influenza from Nasopharyngeal swab specimens and should not be used as a sole basis for treatment. Nasal washings and aspirates are unacceptable for Xpert Xpress SARS-CoV-2/FLU/RSV testing.  Fact Sheet for Patients: BloggerCourse.com  Fact Sheet for Healthcare Providers: SeriousBroker.it  This test is not yet approved or cleared by the [place] FDA and has been authorized for detection and/or diagnosis of SARS-CoV-2 by FDA under an Emergency Use Authorization (EUA). This EUA will remain in effect (meaning this test can be used) for the duration of the COVID-19 declaration under Section 564(b)(1) of the Act, 21  U.S.C. section 360bbb-3(b)(1), unless the authorization is terminated or revoked.  Performed at [facility], [address], [place], [place] [address]  WBC 07/25/2022 8.3  4.0 - 10.5 K/uL Final   RBC 07/25/2022 4.44  3.87 - 5.11 MIL/uL Final   Hemoglobin 07/25/2022 13.7  12.0 - 15.0 g/dL Final   HCT 09/81/1914 40.2  36.0 - 46.0 % Final   MCV 07/25/2022 90.5  80.0 - 100.0 fL Final   MCH 07/25/2022 30.9  26.0 - 34.0 pg Final   MCHC 07/25/2022 34.1  30.0 - 36.0 g/dL Final   RDW 78/29/5621 12.2  11.5 - 15.5 % Final   Platelets 07/25/2022 248  150 - 400 K/uL Final   nRBC 07/25/2022 0.0  0.0 - 0.2 % Final   Neutrophils Relative % 07/25/2022 43  % Final   Neutro Abs 07/25/2022 3.6  1.7 - 7.7 K/uL Final   Lymphocytes Relative 07/25/2022 52  % Final   Lymphs Abs 07/25/2022 4.2 (H)  0.7 - 4.0 K/uL Final   Monocytes Relative 07/25/2022 5  % Final   Monocytes Absolute 07/25/2022 0.4  0.1 - 1.0 K/uL Final   Eosinophils Relative 07/25/2022 0  % Final   Eosinophils Absolute 07/25/2022 0.0  0.0 - 0.5 K/uL Final   Basophils Relative 07/25/2022 0  % Final   Basophils Absolute 07/25/2022 0.0  0.0 - 0.1 K/uL Final   Immature Granulocytes 07/25/2022 0  % Final   Abs Immature Granulocytes 07/25/2022 0.02  0.00 - 0.07 K/uL Final   Performed at [facility], [address] [place], [place] [address]   Sodium 07/25/2022 138  135 - 145 mmol/L Final   Potassium 07/25/2022 4.0  3.5 - 5.1 mmol/L Final   Chloride 07/25/2022 104  98 - 111 mmol/L Final   CO2 07/25/2022 29  22 - 32 mmol/L Final   Glucose, Bld 07/25/2022 83  70 - 99 mg/dL Final   Glucose reference range applies only to samples taken after fasting for at least 8 hours.   BUN 07/25/2022 11  6 - 20 mg/dL Final   Creatinine, Ser 07/25/2022 0.97  0.44 - 1.00 mg/dL Final   Calcium 78/46/9629 9.2  8.9 - 10.3 mg/dL Final   Total Protein 52/84/1324 7.0  6.5 - 8.1 g/dL Final   Albumin 40/07/2724 3.8  3.5 - 5.0 g/dL Final   AST  36/64/4034 18  15 - 41 U/L Final   ALT 07/25/2022 22  0 - 44 U/L Final   Alkaline Phosphatase 07/25/2022 64  38 - 126 U/L Final   Total Bilirubin 07/25/2022 0.2 (L)  0.3 - 1.2 mg/dL Final   GFR, Estimated 07/25/2022 >60  >60 mL/min Final   Comment: (NOTE) Calculated using the CKD-EPI Creatinine Equation (2021)    Anion gap 07/25/2022 5  5 - 15 Final   Performed at [facility], [address], [place], [place] [address]   POC Amphetamine UR 07/25/2022 None Detected  NONE DETECTED (Cut Off Level 1000 ng/mL) Preliminary   POC Secobarbital (BAR) 07/25/2022 None Detected  NONE DETECTED (Cut Off Level 300 ng/mL) Preliminary   POC Buprenorphine (BUP) 07/25/2022 None Detected  NONE DETECTED (Cut Off Level 10 ng/mL) Preliminary   POC Oxazepam (BZO) 07/25/2022 None Detected  NONE DETECTED (Cut Off Level 300 ng/mL) Preliminary   POC Cocaine UR 07/25/2022 None Detected  NONE DETECTED (Cut Off Level 300 ng/mL) Preliminary   POC Methamphetamine UR 07/25/2022 None Detected  NONE DETECTED (Cut Off Level 1000 ng/mL) Preliminary   POC Morphine 07/25/2022 None Detected  NONE DETECTED (Cut Off Level 300 ng/mL) Preliminary   POC Methadone  UR 07/25/2022 None Detected  NONE DETECTED (Cut Off Level 300 ng/mL) Preliminary   POC Oxycodone UR 07/25/2022 None Detected  NONE DETECTED (Cut Off Level 100 ng/mL) Preliminary   POC Marijuana UR 07/25/2022 None Detected  NONE DETECTED (Cut Off Level 50 ng/mL) Preliminary   SARSCOV2ONAVIRUS 2 AG 07/25/2022 NEGATIVE  NEGATIVE Final   Comment: (NOTE) SARS-CoV-2 antigen NOT DETECTED.   Negative results are presumptive.  Negative results do not preclude SARS-CoV-2 infection and should not be used as the sole basis for treatment or other patient management decisions, including infection  control decisions, particularly in the presence of clinical signs and  symptoms consistent with COVID-19, or in those who have been in contact with the virus.  Negative results must be  combined with clinical observations, patient history, and epidemiological information. The expected result is Negative.  Fact Sheet for Patients: https://www.jennings-kim.com/https://www.fda.gov/media/141569/download  Fact Sheet for Healthcare Providers: https://alexander-rogers.biz/https://www.fda.gov/media/141568/download  This test is not yet approved or cleared by the [place] FDA and  has been authorized for detection and/or diagnosis of SARS-CoV-2 by FDA under an Emergency Use Authorization (EUA).  This EUA will remain in effect (meaning this test can be used) for the duration of  the COV                          ID-19 declaration under Section 564(b)(1) of the Act, 21 U.S.C. section 360bbb-3(b)(1), unless the authorization is terminated or revoked sooner.     Cholesterol 07/25/2022 181  0 - 200 mg/dL Final   Triglycerides 16/10/960410/09/2022 118  <150 mg/dL Final   HDL 54/09/811910/09/2022 45  >40 mg/dL Final   Total CHOL/HDL Ratio 07/25/2022 4.0  RATIO Final   VLDL 07/25/2022 24  0 - 40 mg/dL Final   LDL Cholesterol 07/25/2022 112 (H)  0 - 99 mg/dL Final   Comment:        Total Cholesterol/HDL:CHD Risk Coronary Heart Disease Risk Table                     Men   Women  1/2 Average Risk   3.4   3.3  Average Risk       5.0   4.4  2 X Average Risk   9.6   7.1  3 X Average Risk  23.4   11.0        Use the calculated Patient Ratio above and the CHD Risk Table to determine the patient's CHD Risk.        ATP III CLASSIFICATION (LDL):  <100     mg/dL   Optimal  [id number]  mg/dL   Near or Above                    Optimal  130-159  mg/dL   Borderline  [id number]  mg/dL   High  >865>190     mg/dL   Very High Performed at [facility], [address] [place], [place] [address]    TSH 07/25/2022 6.668 (H)  0.350 - 4.500 uIU/mL Final   Comment: Performed by a 3rd Generation assay with a functional sensitivity of <=0.01 uIU/mL. Performed at [facility], [address] [place], [place] [address]    Glucose-Capillary 07/26/2022 104 (H)  70  - 99 mg/dL Final   Glucose reference range applies only to samples taken after fasting for at least 8 hours.   T3, Free 07/31/2022 2.3  2.0 -  4.4 pg/mL Final   Comment: (NOTE) Performed At: [facility] [address] [place], [place] [id number] Jolene Schimke MD FT:[id number]    Free T4 07/31/2022 0.60 (L)  0.61 - 1.12 ng/dL Final   Comment: (NOTE) Biotin ingestion may interfere with free T4 tests. If the results are inconsistent with the TSH level, previous test results, or the clinical presentation, then consider biotin interference. If needed, order repeat testing after stopping biotin. Performed at [facility], [address], [place], [place] [address]   Admission on 07/04/2022, Discharged on 07/05/2022  Component Date Value Ref Range Status   Sodium 07/04/2022 142  135 - 145 mmol/L Final   Potassium 07/04/2022 4.4  3.5 - 5.1 mmol/L Final   Chloride 07/04/2022 109  98 - 111 mmol/L Final   CO2 07/04/2022 26  22 - 32 mmol/L Final   Glucose, Bld 07/04/2022 96  70 - 99 mg/dL Final   Glucose reference range applies only to samples taken after fasting for at least 8 hours.   BUN 07/04/2022 15  6 - 20 mg/dL Final   Creatinine, Ser 07/04/2022 0.83  0.44 - 1.00 mg/dL Final   Calcium 62/83/1517 9.3  8.9 - 10.3 mg/dL Final   Total Protein 61/60/7371 7.2  6.5 - 8.1 g/dL Final   Albumin 04/08/9484 3.9  3.5 - 5.0 g/dL Final   AST 46/27/0350 23  15 - 41 U/L Final   ALT 07/04/2022 28  0 - 44 U/L Final   Alkaline Phosphatase 07/04/2022 77  38 - 126 U/L Final   Total Bilirubin 07/04/2022 0.4  0.3 - 1.2 mg/dL Final   GFR, Estimated 07/04/2022 >60  >60 mL/min Final   Comment: (NOTE) Calculated using the CKD-EPI Creatinine Equation (2021)    Anion gap 07/04/2022 7  5 - 15 Final   Performed at [facility], [address] [place], [place] [address]   Alcohol, Ethyl (B) 07/04/2022 <10  <10 mg/dL Final   Comment: (NOTE) Lowest detectable limit for serum  alcohol is 10 mg/dL.  For medical purposes only. Performed at [facility], [address] [place], [place] [address]    WBC 07/04/2022 7.0  4.0 - 10.5 K/uL Final   RBC 07/04/2022 4.18  3.87 - 5.11 MIL/uL Final   Hemoglobin 07/04/2022 12.8  12.0 - 15.0 g/dL Final   HCT 71/69/6789 39.1  36.0 - 46.0 % Final   MCV 07/04/2022 93.5  80.0 - 100.0 fL Final   MCH 07/04/2022 30.6  26.0 - 34.0 pg Final   MCHC 07/04/2022 32.7  30.0 - 36.0 g/dL Final   RDW 38/07/1750 12.9  11.5 - 15.5 % Final   Platelets 07/04/2022 243  150 - 400 K/uL Final   nRBC 07/04/2022 0.0  0.0 - 0.2 % Final   Neutrophils Relative % 07/04/2022 43  % Final   Neutro Abs 07/04/2022 3.0  1.7 - 7.7 K/uL Final   Lymphocytes Relative 07/04/2022 50  % Final   Lymphs Abs 07/04/2022 3.5  0.7 - 4.0 K/uL Final   Monocytes Relative 07/04/2022 7  % Final   Monocytes Absolute 07/04/2022 0.5  0.1 - 1.0 K/uL Final   Eosinophils Relative 07/04/2022 0  % Final   Eosinophils Absolute 07/04/2022 0.0  0.0 - 0.5 K/uL Final   Basophils Relative 07/04/2022 0  % Final   Basophils Absolute 07/04/2022 0.0  0.0 - 0.1 K/uL Final   Immature Granulocytes 07/04/2022 0  % Final   Abs  Immature Granulocytes 07/04/2022 0.01  0.00 - 0.07 K/uL Final   Performed at [facility], [address], [place], [place] [address]   I-stat hCG, quantitative 07/04/2022 <5.0  <5 mIU/mL Final   Comment 3 07/04/2022          Final   Comment:   GEST. AGE      CONC.  (mIU/mL)   <=1 WEEK        5 - 50     2 WEEKS       50 - 500     3 WEEKS       100 - 10,000     4 WEEKS     1,000 - 30,000        FEMALE AND NON-PREGNANT FEMALE:     LESS THAN 5 mIU/mL   Admission on 06/14/2022, Discharged on 06/14/2022  Component Date Value Ref Range Status   Color, UA 06/14/2022 yellow  yellow Final   Clarity, UA 06/14/2022 cloudy (A)  clear Final   Glucose, UA 06/14/2022 negative  negative mg/dL Final   Bilirubin, UA 40/07/2724 negative  negative  Final   Ketones, POC UA 06/14/2022 negative  negative mg/dL Final   Spec Grav, UA 36/64/4034 1.025  1.010 - 1.025 Final   Blood, UA 06/14/2022 negative  negative Final   pH, UA 06/14/2022 7.5  5.0 - 8.0 Final   Protein Ur, POC 06/14/2022 =30 (A)  negative mg/dL Final   Urobilinogen, UA 06/14/2022 0.2  0.2 or 1.0 E.U./dL Final   Nitrite, UA 74/25/9563 Negative  Negative Final   Leukocytes, UA 06/14/2022 Negative  Negative Final   Preg Test, Ur 06/14/2022 Negative  Negative Final   Specimen Description 06/14/2022 URINE, CLEAN CATCH   Final   Special Requests 06/14/2022    Final                   Value:NONE Performed at [facility], [address], [place], [place] [address]    Culture 06/14/2022 MULTIPLE SPECIES PRESENT, SUGGEST RECOLLECTION (A)   Final   Report Status 06/14/2022 06/16/2022 FINAL   Final  Admission on 06/07/2022, Discharged on 06/10/2022  Component Date Value Ref Range Status   SARS Coronavirus 2 by RT PCR 06/07/2022 NEGATIVE  NEGATIVE Final   Comment: (NOTE) SARS-CoV-2 target nucleic acids are NOT DETECTED.  The SARS-CoV-2 RNA is generally detectable in upper respiratory specimens during the acute phase of infection. The lowest concentration of SARS-CoV-2 viral copies this assay can detect is 138 copies/mL. A negative result does not preclude SARS-Cov-2 infection and should not be used as the sole basis for treatment or other patient management decisions. A negative result may occur with  improper specimen collection/handling, submission of specimen other than nasopharyngeal swab, presence of viral mutation(s) within the areas targeted by this assay, and inadequate number of viral copies(<138 copies/mL). A negative result must be combined with clinical observations, patient history, and epidemiological information. The expected result is Negative.  Fact Sheet for Patients:  BloggerCourse.com  Fact Sheet for Healthcare Providers:   SeriousBroker.it  This test is no                          t yet approved or cleared by the [place] FDA and  has been authorized for detection and/or diagnosis of SARS-CoV-2 by FDA under an Emergency Use Authorization (EUA). This EUA will remain  in effect (meaning this test can  be used) for the duration of the COVID-19 declaration under Section 564(b)(1) of the Act, 21 U.S.C.section 360bbb-3(b)(1), unless the authorization is terminated  or revoked sooner.       Influenza A by PCR 06/07/2022 NEGATIVE  NEGATIVE Final   Influenza B by PCR 06/07/2022 NEGATIVE  NEGATIVE Final   Comment: (NOTE) The Xpert Xpress SARS-CoV-2/FLU/RSV plus assay is intended as an aid in the diagnosis of influenza from Nasopharyngeal swab specimens and should not be used as a sole basis for treatment. Nasal washings and aspirates are unacceptable for Xpert Xpress SARS-CoV-2/FLU/RSV testing.  Fact Sheet for Patients: BloggerCourse.com  Fact Sheet for Healthcare Providers: SeriousBroker.it  This test is not yet approved or cleared by the [place] FDA and has been authorized for detection and/or diagnosis of SARS-CoV-2 by FDA under an Emergency Use Authorization (EUA). This EUA will remain in effect (meaning this test can be used) for the duration of the COVID-19 declaration under Section 564(b)(1) of the Act, 21 U.S.C. section 360bbb-3(b)(1), unless the authorization is terminated or revoked.  Performed at [facility], [address] [place], [place] [address]    WBC 06/07/2022 5.7  4.0 - 10.5 K/uL Final   RBC 06/07/2022 4.39  3.87 - 5.11 MIL/uL Final   Hemoglobin 06/07/2022 13.2  12.0 - 15.0 g/dL Final   HCT 40/98/1191 40.4  36.0 - 46.0 % Final   MCV 06/07/2022 92.0  80.0 - 100.0 fL Final   MCH 06/07/2022 30.1  26.0 - 34.0 pg Final   MCHC 06/07/2022 32.7  30.0 - 36.0 g/dL Final   RDW 47/82/9562 12.4  11.5  - 15.5 % Final   Platelets 06/07/2022 308  150 - 400 K/uL Final   nRBC 06/07/2022 0.0  0.0 - 0.2 % Final   Neutrophils Relative % 06/07/2022 42  % Final   Neutro Abs 06/07/2022 2.4  1.7 - 7.7 K/uL Final   Lymphocytes Relative 06/07/2022 54  % Final   Lymphs Abs 06/07/2022 3.1  0.7 - 4.0 K/uL Final   Monocytes Relative 06/07/2022 4  % Final   Monocytes Absolute 06/07/2022 0.2  0.1 - 1.0 K/uL Final   Eosinophils Relative 06/07/2022 0  % Final   Eosinophils Absolute 06/07/2022 0.0  0.0 - 0.5 K/uL Final   Basophils Relative 06/07/2022 0  % Final   Basophils Absolute 06/07/2022 0.0  0.0 - 0.1 K/uL Final   Immature Granulocytes 06/07/2022 0  % Final   Abs Immature Granulocytes 06/07/2022 0.01  0.00 - 0.07 K/uL Final   Performed at [facility], [address] [place], [place] [address]   Sodium 06/07/2022 139  135 - 145 mmol/L Final   Potassium 06/07/2022 4.0  3.5 - 5.1 mmol/L Final   Chloride 06/07/2022 104  98 - 111 mmol/L Final   CO2 06/07/2022 28  22 - 32 mmol/L Final   Glucose, Bld 06/07/2022 104 (H)  70 - 99 mg/dL Final   Glucose reference range applies only to samples taken after fasting for at least 8 hours.   BUN 06/07/2022 8  6 - 20 mg/dL Final   Creatinine, Ser 06/07/2022 0.84  0.44 - 1.00 mg/dL Final   Calcium 57/84/6962 9.1  8.9 - 10.3 mg/dL Final   Total Protein 95/28/4132 6.9  6.5 - 8.1 g/dL Final   Albumin 44/10/270 3.7  3.5 - 5.0 g/dL Final   AST 53/66/4403 19  15 - 41 U/L Final   ALT 06/07/2022 22  0 - 44  U/L Final   Alkaline Phosphatase 06/07/2022 54  38 - 126 U/L Final   Total Bilirubin 06/07/2022 0.4  0.3 - 1.2 mg/dL Final   GFR, Estimated 06/07/2022 >60  >60 mL/min Final   Comment: (NOTE) Calculated using the CKD-EPI Creatinine Equation (2021)    Anion gap 06/07/2022 7  5 - 15 Final   Performed at [facility] [address] [place], [place] [address]   Hgb A1c MFr Bld 06/07/2022 5.0  4.8 - 5.6 % Final   Comment: (NOTE) Pre diabetes:           5.7%-6.4%  Diabetes:              >6.4%  Glycemic control for   <7.0% adults with diabetes    Mean Plasma Glucose 06/07/2022 96.8  mg/dL Final   Performed at [facility], [place] [address] [place], [place] [address]   TSH 06/07/2022 1.620  0.350 - 4.500 uIU/mL Final   Comment: Performed by a 3rd Generation assay with a functional sensitivity of <=0.01 uIU/mL. Performed at [facility], [place] [address] [place], [place] [address]    RPR Ser Ql 06/07/2022 NON REACTIVE  NON REACTIVE Final   Performed at [facility], [place] [address] [place], [place] [address]   Color, Urine 06/07/2022 YELLOW  YELLOW Final   APPearance 06/07/2022 HAZY (A)  CLEAR Final   Specific Gravity, Urine 06/07/2022 1.018  1.005 - 1.030 Final   pH 06/07/2022 7.0  5.0 - 8.0 Final   Glucose, UA 06/07/2022 NEGATIVE  NEGATIVE mg/dL Final   Hgb urine dipstick 06/07/2022 NEGATIVE  NEGATIVE Final   Bilirubin Urine 06/07/2022 NEGATIVE  NEGATIVE Final   Ketones, ur 06/07/2022 NEGATIVE  NEGATIVE mg/dL Final   Protein, ur 06/07/2022 NEGATIVE  NEGATIVE mg/dL Final   Nitrite 06/07/2022 NEGATIVE  NEGATIVE Final   Leukocytes,Ua 06/07/2022 NEGATIVE  NEGATIVE Final   Performed at [facility], [place] [address] [place], [place] [address]   Cholesterol 06/07/2022 164  0 - 200 mg/dL Final   Triglycerides 06/07/2022 158 (H)  <150 mg/dL Final   HDL 06/07/2022 44  >40 mg/dL Final   Total CHOL/HDL Ratio 06/07/2022 3.7  RATIO Final   VLDL 06/07/2022 32  0 - 40 mg/dL Final   LDL Cholesterol 06/07/2022 88  0 - 99 mg/dL Final   Comment:        Total Cholesterol/HDL:CHD Risk Coronary Heart Disease Risk Table                     Men   Women  1/2 Average Risk   3.4   3.3  Average Risk       5.0   4.4  2 X Average Risk   9.6   7.1  3 X Average Risk  23.4   11.0        Use the calculated Patient Ratio above and the CHD Risk Table to determine the patient's CHD Risk.        ATP III CLASSIFICATION (LDL):  <100      mg/dL   Optimal  100-129  mg/dL   Near or Above                    Optimal  130-159  mg/dL   Borderline  160-189  mg/dL   High  >190     mg/dL   Very High Performed at [facility],  [address] [place], [place] [address]    HIV Screen 4th Generation wRfx 06/07/2022 Non Reactive  Non Reactive Final   Performed at [facility], [address] [place], [place] [address]   SARSCOV2ONAVIRUS 2 AG 06/07/2022 NEGATIVE  NEGATIVE Final   Comment: (NOTE) SARS-CoV-2 antigen NOT DETECTED.   Negative results are presumptive.  Negative results do not preclude SARS-CoV-2 infection and should not be used as the sole basis for treatment or other patient management decisions, including infection  control decisions, particularly in the presence of clinical signs and  symptoms consistent with COVID-19, or in those who have been in contact with the virus.  Negative results must be combined with clinical observations, patient history, and epidemiological information. The expected result is Negative.  Fact Sheet for Patients: https://www.jennings-kim.com/  Fact Sheet for Healthcare Providers: https://alexander-rogers.biz/  This test is not yet approved or cleared by the [place] FDA and  has been authorized for detection and/or diagnosis of SARS-CoV-2 by FDA under an Emergency Use Authorization (EUA).  This EUA will remain in effect (meaning this test can be used) for the duration of  the COV                          ID-19 declaration under Section 564(b)(1) of the Act, 21 U.S.C. section 360bbb-3(b)(1), unless the authorization is terminated or revoked sooner.     POC Amphetamine UR 06/07/2022 None Detected  NONE DETECTED (Cut Off Level 1000 ng/mL) Final   POC Secobarbital (BAR) 06/07/2022 None Detected  NONE DETECTED (Cut Off Level 300 ng/mL) Final   POC Buprenorphine (BUP) 06/07/2022 None Detected  NONE DETECTED (Cut Off Level 10 ng/mL) Final   POC  Oxazepam (BZO) 06/07/2022 None Detected  NONE DETECTED (Cut Off Level 300 ng/mL) Final   POC Cocaine UR 06/07/2022 None Detected  NONE DETECTED (Cut Off Level 300 ng/mL) Final   POC Methamphetamine UR 06/07/2022 None Detected  NONE DETECTED (Cut Off Level 1000 ng/mL) Final   POC Morphine 06/07/2022 None Detected  NONE DETECTED (Cut Off Level 300 ng/mL) Final   POC Methadone UR 06/07/2022 None Detected  NONE DETECTED (Cut Off Level 300 ng/mL) Final   POC Oxycodone UR 06/07/2022 None Detected  NONE DETECTED (Cut Off Level 100 ng/mL) Final   POC Marijuana UR 06/07/2022 None Detected  NONE DETECTED (Cut Off Level 50 ng/mL) Final   Preg Test, Ur 06/08/2022 NEGATIVE  NEGATIVE Final   Comment:        THE SENSITIVITY OF THIS METHODOLOGY IS >20 mIU/mL. Performed at [facility], [address], [place], [place] [address]    Preg Test, Ur 06/08/2022 NEGATIVE  NEGATIVE Final   Comment:        THE SENSITIVITY OF THIS METHODOLOGY IS >24 mIU/mL     Blood Alcohol level:  Lab Results  Component Value Date   ETH <10 07/04/2022   ETH <10 02/07/2022    Metabolic Disorder Labs: Lab Results  Component Value Date   HGBA1C 5.0 06/07/2022   MPG 96.8 06/07/2022   MPG 96.8 02/07/2022   No results found for: "PROLACTIN" Lab Results  Component Value Date   CHOL 181 07/25/2022   TRIG 118 07/25/2022   HDL 45 07/25/2022   CHOLHDL 4.0 07/25/2022   VLDL 24 07/25/2022   LDLCALC 112 (H) 07/25/2022   LDLCALC 88 06/07/2022    Therapeutic Lab Levels: No results found for: "LITHIUM" No results found  for: "VALPROATE" No results found for: "CBMZ"  Physical Findings   GAD-7    Flowsheet Row [facility]17/2021 [facility]  Total GAD-7 Score 17      PHQ2-9    Flowsheet Row ED from 06/07/2022 in [facility] Office Visit from 12/17/2021 in [facility]  PHQ-2 Total Score 1 2  PHQ-9 Total Score 2 8       Flowsheet Row ED from 07/25/2022 in [facility] ED from 07/04/2022 in [facility] from 06/14/2022 in [facility]   C-SSRS RISK CATEGORY High Risk No Risk No Risk        Musculoskeletal  Strength & Muscle Tone: within normal limits Gait & Station: normal Patient leans: N/A  Psychiatric Specialty Exam  Presentation  General Appearance:  Appropriate for Environment  Eye Contact: Good  Speech: Clear and Coherent; Normal Rate  Speech Volume: Normal  Handedness: Right   Mood and Affect  Mood: Euthymic  Affect: Appropriate; Congruent   Thought Process  Thought Processes: Coherent; Goal Directed  Descriptions of Associations:Intact  Orientation:Full (Time, Place and Person)  Thought Content:Logical  Diagnosis of Schizophrenia or Schizoaffective disorder in past: No    Hallucinations:Hallucinations: None  Ideas of Reference:None  Suicidal Thoughts:Suicidal Thoughts: No  Homicidal Thoughts:Homicidal Thoughts: No   Sensorium  Memory: Immediate Good; Recent Good  Judgment: Intact  Insight: Present   Executive Functions  Concentration: Fair  Attention Span: Fair  Recall: [employer] of Knowledge: Fair  Language: Fair   Psychomotor Activity  Psychomotor Activity: Psychomotor Activity: Normal   Assets  Assets: Communication Skills; Desire for Improvement   Sleep  Sleep: Sleep: Good   Nutritional Assessment (For OBS and [facility] admissions only) Has the patient had a weight loss or gain of 10 pounds or more in the last 3 months?: No Has the patient had a decrease in food intake/or appetite?: No Does the patient have dental problems?: No Does the patient have eating habits or behaviors that may be indicators of an eating disorder including binging or inducing vomiting?: No Has the patient recently lost weight without trying?: 0 Has the  patient been eating poorly because of a decreased appetite?: 0 Malnutrition Screening Tool Score: 0   Physical Exam  Physical Exam Constitutional:      General: She is not in acute distress.    Appearance: She is not ill-appearing, toxic-appearing or diaphoretic.  Eyes:     General: No scleral icterus. Cardiovascular:     Rate and Rhythm: Normal rate.  Pulmonary:     Effort: Pulmonary effort is normal. No respiratory distress.  Neurological:     Mental Status: She is alert and oriented to person, place, and time.  Psychiatric:        Attention and Perception: Attention and perception normal.        Mood and Affect: Mood and affect normal.        Speech: Speech normal.        Behavior: Behavior normal.        Thought Content: Thought content normal.    Review of Systems  Constitutional:  Negative for chills and fever.  Respiratory:  Negative for shortness of breath.   Cardiovascular:  Negative for chest pain and palpitations.  Gastrointestinal:  Negative for abdominal pain.  Neurological:  Negative for headaches.  Psychiatric/Behavioral:  Depression: Stable. Hallucinations:  Denies. Suicidal ideas: Denies. Nervous/anxious: Stable.    Blood pressure 109/83, pulse (!) 105, temperature 98.2 F (36.8 C), temperature source Tympanic, resp. rate 18, SpO2 99 %. There is no height or weight on file to calculate BMI.  Treatment Plan Summary: Plan Pt remains psychiatrically cleared. Disposition is pending placement. Social work to continue working with DSS for appropriate placement  [employer], NP 08/26/2022 9:30 AM

## 2022-08-26 NOTE — ED Notes (Signed)
Pt sleeping at this time. Breathing even and unlabored. Will continue to monitor for safety

## 2022-08-26 NOTE — ED Notes (Signed)
Patient remains cooperative on unit. Patient observed calling resources on phone attempting to find placement. Patient requested to speak with social worker earlier today and social worker was contacted. No further needs verbalized by patient at this time.

## 2022-08-26 NOTE — ED Notes (Signed)
Pt calm and cooperative. Pt is requesting paper to write on. She is watching television and talking with pt that is sitting next to her. Pt has no c/o pain or distress. Will continue to monitor for safety

## 2022-08-26 NOTE — Progress Notes (Signed)
[name] Progress Note   [name] updated pt on the process for getting her into a new placement.  [name] answered any questions and concerns the pt had regarding her belongings at the previous group home and why she could not get into an AFL.  [name] sent an email asking for an update on regarding the pt's disposition as the pt had learned from the care manager that she still has not been accepted anywhere at this time.   Hansel Starling, MSW, [name] [facility] [phone] phone [phone] fax

## 2022-08-26 NOTE — ED Notes (Signed)
Pt sleeping@this time. Breathing even and unlabored. Will continue to monitor for safety 

## 2022-08-26 NOTE — ED Notes (Signed)
Pt had bedtime snack. 

## 2022-08-26 NOTE — ED Notes (Signed)
Patient alert and eating breakfast. Patient denies SI,HI, and A/V/H with no plan/intent. Patient denies any pain/discomfort and is med compliant.

## 2022-08-27 NOTE — Progress Notes (Signed)
[facility] spoke with the client regarding updates related to placement.    08-19-22.  Nicole [name] has facilities that are willing to review the patient for potential placement. I have attached the application as an attachment.  [facility] will not assist with placement until we have an assessment for the patient.  Nicole [name] will assist with making phone calls and contacting facilities that will consider the patient for placement pending the evaluations.  She received an IQ test with Dr. Melvyn Neth on      08-19-2022  Writer emailed and called [facility], [facility], for updates regarding placement.  Writer has not heard back from [place] or [place] within the last week.  ([email or web address])  Phone number: [phone]    [name] [facility]

## 2022-08-27 NOTE — ED Notes (Signed)
[name]&Ox4. Patient denies SI/HI nd AVH. Patient denies any physical complaints when asked. No acute distress noted. Support and encouragement provided. Routine safety checks conducted according to facility protocol. Encouraged patient to notify staff if thoughts of harm toward self or others arise. Patient verbalize understanding and agreement. Will continue to monitor for safety.

## 2022-08-27 NOTE — ED Notes (Signed)
Patient quietly watching TV.  Respirations equal and unlabored, skin warm and dry, NAD. Routine safety checks conducted according to facility protocol. Will continue to monitor for safety.

## 2022-08-27 NOTE — ED Notes (Signed)
Pt sleeping at present, no distress noted.  Monitoring for safety. 

## 2022-08-27 NOTE — ED Notes (Signed)
Pt sleeping@this time. breathing even and unlabored. Will continue to monitor for safety 

## 2022-08-27 NOTE — ED Notes (Signed)
Pt A&O x 4, no distress noted, calm & cooperative, watching TV at present.  Monitoring for safety. 

## 2022-08-27 NOTE — ED Provider Notes (Signed)
Behavioral Health Progress Note  Date and Time: 08/27/2022 1:31 PM Name: Nicole [name] MRN:  [id number]  Subjective:   Nicole [name] is a 31 y.o. female, with PMH of SCZ unspecified, MDD with psychosis, who presented voluntary to [facility] (07/25/2022) via [facility] after a verbal altercation with [name] (not patient's legal guardian) for transient SI.  This is patient's fourth visit to [facility] for similar concerns this year. Patient has been dismissed from previous group home due to threatening SI and HI, then leaving the group home.   Legal Guardian: [name]) transitioning to be [name]) Point of contact: [name])  Nicole [name], 18 y.o., female patient seen face to face by this provider, consulted with Dr. Hampton Abbot; and chart reviewed on 08/27/22.  On evaluation Nicole [name] reports she is eating and sleeping without difficulty.  She denies suicidal/self-harm/homicidal ideation, psychosis, and paranoia.   During evaluation Nicole [name] is lying in bed with no noted distress.  She is alert, oriented x 4, calm, cooperative and attentive.  Her mood is euthymic with congruent affect.  She has normal speech, and behavior.  Objectively there is no evidence of psychosis/mania or delusional thinking.  Patient is able to converse coherently, goal directed thoughts, no distractibility, or pre-occupation.  She also denies suicidal/self-harm/homicidal ideation, psychosis, and paranoia.  Patient answered question appropriately.  Diagnosis:  Final diagnoses:  Severe episode of recurrent major depressive disorder, with psychotic features ([place])    Total Time spent with patient: 15 minutes  Past Psychiatric History: SCZ unspecified, MDD with psychosis, Anxiety Past Medical History:  Past Medical History:  Diagnosis Date   Anxiety    Depression    Hypothyroidism 08/07/2022   Tobacco use disorder 08/07/2022    Past Surgical History:  Procedure Laterality Date    WISDOM TOOTH EXTRACTION Bilateral 2020   Family History:  Family History  Problem Relation Age of Onset   Hypertension Father    Diabetes Father    Family Psychiatric  History: None reported Social History:  Social History   Substance and Sexual Activity  Alcohol Use Never     Social History   Substance and Sexual Activity  Drug Use Never    Social History   Socioeconomic History   Marital status: Single    Spouse name: Not on file   Number of children: Not on file   Years of education: Not on file   Highest education level: Not on file  Occupational History   Not on file  Tobacco Use   Smoking status: Every Day    Types: Cigarettes   Smokeless tobacco: Never  Vaping Use   Vaping Use: Some days  Substance and Sexual Activity   Alcohol use: Never   Drug use: Never   Sexual activity: Yes    Partners: Female    Birth control/protection: Implant  Other Topics Concern   Not on file  Social History Narrative   Not on file   Social Determinants of Health   Financial Resource Strain: Not on file  Food Insecurity: Not on file  Transportation Needs: Not on file  Physical Activity: Not on file  Stress: Not on file  Social Connections: Not on file   SDOH:  SDOH Screenings   Depression (PHQ2-9): Low Risk  (06/10/2022)  Tobacco Use: High Risk (08/26/2022)   Additional Social History:    Pain Medications: SEE MAR Prescriptions: SEE MAR Over the Counter: SEE MAR History of alcohol / drug use?: Yes Longest  period of sobriety (when/how long): N/A Negative Consequences of Use:  (NONE) Withdrawal Symptoms: None Name of Substance 1: ETOH IN PAST--PT CURRENTLY DENIES 1 - Frequency: SOCIAL USE ETOH IN PAST 1 - Method of Aquiring: LEGAL 1- Route of Use: ORAL DRINK Name of Substance 2: [place] 2 - Amount (size/oz): LESS THAN ONE PACK 2 - Frequency: DAILY 2 - Method of Aquiring: LEGAL 2 - Route of Substance Use: ORAL SMOKE                 Sleep: Good  Appetite:  Good  Current Medications:  Current Facility-Administered Medications  Medication Dose Route Frequency Provider Last Rate Last Admin   acetaminophen (TYLENOL) tablet 650 mg  650 mg Oral Q6H PRN Onuoha, Chinwendu V, NP   650 mg at 08/24/22 1336   alum & mag hydroxide-simeth (MAALOX/MYLANTA) 200-200-20 MG/5ML suspension 30 mL  30 mL Oral Q4H PRN Onuoha, Chinwendu V, NP   30 mL at 07/27/22 2151   ARIPiprazole ER (ABILIFY MAINTENA) injection 400 mg  400 mg Intramuscular Q28 days Merrily Brittle, DO   400 mg at 08/02/22 1154   clotrimazole (LOTRIMIN) 1 % cream   Topical BID Bobbitt, Shalon E, NP   Given at 08/27/22 0959   hydrOXYzine (ATARAX) tablet 25 mg  25 mg Oral TID PRN Onuoha, Chinwendu V, NP   25 mg at 08/26/22 2137   ibuprofen (ADVIL) tablet 400 mg  400 mg Oral Q6H PRN Merrily Brittle, DO   400 mg at 08/22/22 2027   levothyroxine (SYNTHROID) tablet 100 mcg  100 mcg Oral Q0600 Merrily Brittle, DO   100 mcg at 08/27/22 0601   ziprasidone (GEODON) injection 20 mg  20 mg Intramuscular Q12H PRN Onuoha, Chinwendu V, NP       And   LORazepam (ATIVAN) tablet 1 mg  1 mg Oral PRN Onuoha, Chinwendu V, NP       magnesium hydroxide (MILK OF MAGNESIA) suspension 30 mL  30 mL Oral Daily PRN Onuoha, Chinwendu V, NP   30 mL at 08/22/22 1005   nicotine (NICODERM CQ - dosed in mg/24 hours) patch 14 mg  14 mg Transdermal Q0600 Onuoha, Chinwendu V, NP   14 mg at 08/27/22 0953   ondansetron (ZOFRAN-ODT) disintegrating tablet 4 mg  4 mg Oral Q8H PRN Evette Georges, NP   4 mg at 08/26/22 2145   Oxcarbazepine (TRILEPTAL) tablet 300 mg  300 mg Oral BID Onuoha, Chinwendu V, NP   300 mg at 08/27/22 0954   pantoprazole (PROTONIX) EC tablet 20 mg  20 mg Oral Daily Merrily Brittle, DO   20 mg at 08/27/22 0954   QUEtiapine (SEROQUEL) tablet 400 mg  400 mg Oral BID Onuoha, Chinwendu V, NP   400 mg at 08/27/22 [id number]   senna-docusate (Senokot-S) tablet 1 tablet  1 tablet Oral QHS Merrily Brittle, DO   1  tablet at 08/26/22 2137   sertraline (ZOLOFT) tablet 150 mg  150 mg Oral Daily Onuoha, Chinwendu V, NP   150 mg at 08/27/22 0954   traZODone (DESYREL) tablet 50 mg  50 mg Oral QHS PRN Onuoha, Chinwendu V, NP   50 mg at 08/26/22 2137   valACYclovir (VALTREX) tablet 500 mg  500 mg Oral Daily Merrily Brittle, DO   500 mg at 08/27/22 [id number]   Current Outpatient Medications  Medication Sig Dispense Refill   ABILIFY MAINTENA 400 MG PRSY prefilled syringe 400 mg every 28 (twenty-eight) days.     cetirizine (ZYRTEC)  10 MG tablet Take 10 mg by mouth daily.     cyclobenzaprine (FLEXERIL) 10 MG tablet Take 1 tablet (10 mg total) by mouth 2 (two) times daily as needed for muscle spasms. 20 tablet 0   fluticasone (FLONASE) 50 MCG/ACT nasal spray Place 1 spray into both nostrils daily.     meloxicam (MOBIC) 15 MG tablet Take 15 mg by mouth daily.     Oxcarbazepine (TRILEPTAL) 300 MG tablet Take 1 tablet (300 mg total) by mouth 2 (two) times daily. 60 tablet 0   QUEtiapine (SEROQUEL) 400 MG tablet Take 1 tablet (400 mg total) by mouth 2 (two) times daily. 60 tablet 0   sertraline (ZOLOFT) 50 MG tablet Take 3 tablets (150 mg total) by mouth in the morning. 90 tablet 0   traZODone (DESYREL) 100 MG tablet Take 1 tablet (100 mg total) by mouth at bedtime. 30 tablet 0   valACYclovir (VALTREX) 500 MG tablet Take 500 mg by mouth daily.      Labs  Lab Results:  Admission on 07/25/2022  Component Date Value Ref Range Status   SARS Coronavirus 2 by RT PCR 07/25/2022 NEGATIVE  NEGATIVE Final   Comment: (NOTE) SARS-CoV-2 target nucleic acids are NOT DETECTED.  The SARS-CoV-2 RNA is generally detectable in upper respiratory specimens during the acute phase of infection. The lowest concentration of SARS-CoV-2 viral copies this assay can detect is 138 copies/mL. A negative result does not preclude SARS-Cov-2 infection and should not be used as the sole basis for treatment or other patient management decisions. A  negative result may occur with  improper specimen collection/handling, submission of specimen other than nasopharyngeal swab, presence of viral mutation(s) within the areas targeted by this assay, and inadequate number of viral copies(<138 copies/mL). A negative result must be combined with clinical observations, patient history, and epidemiological information. The expected result is Negative.  Fact Sheet for Patients:  EntrepreneurPulse.com.au  Fact Sheet for Healthcare Providers:  IncredibleEmployment.be  This test is no                          t yet approved or cleared by the [place] FDA and  has been authorized for detection and/or diagnosis of SARS-CoV-2 by FDA under an Emergency Use Authorization (EUA). This EUA will remain  in effect (meaning this test can be used) for the duration of the COVID-19 declaration under Section 564(b)(1) of the Act, 21 U.S.C.section 360bbb-3(b)(1), unless the authorization is terminated  or revoked sooner.       Influenza A by PCR 07/25/2022 NEGATIVE  NEGATIVE Final   Influenza B by PCR 07/25/2022 NEGATIVE  NEGATIVE Final   Comment: (NOTE) The Xpert Xpress SARS-CoV-2/FLU/RSV plus assay is intended as an aid in the diagnosis of influenza from Nasopharyngeal swab specimens and should not be used as a sole basis for treatment. Nasal washings and aspirates are unacceptable for Xpert Xpress SARS-CoV-2/FLU/RSV testing.  Fact Sheet for Patients: EntrepreneurPulse.com.au  Fact Sheet for Healthcare Providers: IncredibleEmployment.be  This test is not yet approved or cleared by the [place] FDA and has been authorized for detection and/or diagnosis of SARS-CoV-2 by FDA under an Emergency Use Authorization (EUA). This EUA will remain in effect (meaning this test can be used) for the duration of the COVID-19 declaration under Section 564(b)(1) of the Act, 21  U.S.C. section 360bbb-3(b)(1), unless the authorization is terminated or revoked.  Performed at [facility], [place],  [place] [address]    WBC 07/25/2022 8.3  4.0 - 10.5 K/uL Final   RBC 07/25/2022 4.44  3.87 - 5.11 MIL/uL Final   Hemoglobin 07/25/2022 13.7  12.0 - 15.0 g/dL Final   HCT 07/25/2022 40.2  36.0 - 46.0 % Final   MCV 07/25/2022 90.5  80.0 - 100.0 fL Final   MCH 07/25/2022 30.9  26.0 - 34.0 pg Final   MCHC 07/25/2022 34.1  30.0 - 36.0 g/dL Final   RDW 07/25/2022 12.2  11.5 - 15.5 % Final   Platelets 07/25/2022 248  150 - 400 K/uL Final   nRBC 07/25/2022 0.0  0.0 - 0.2 % Final   Neutrophils Relative % 07/25/2022 43  % Final   Neutro Abs 07/25/2022 3.6  1.7 - 7.7 K/uL Final   Lymphocytes Relative 07/25/2022 52  % Final   Lymphs Abs 07/25/2022 4.2 (H)  0.7 - 4.0 K/uL Final   Monocytes Relative 07/25/2022 5  % Final   Monocytes Absolute 07/25/2022 0.4  0.1 - 1.0 K/uL Final   Eosinophils Relative 07/25/2022 0  % Final   Eosinophils Absolute 07/25/2022 0.0  0.0 - 0.5 K/uL Final   Basophils Relative 07/25/2022 0  % Final   Basophils Absolute 07/25/2022 0.0  0.0 - 0.1 K/uL Final   Immature Granulocytes 07/25/2022 0  % Final   Abs Immature Granulocytes 07/25/2022 0.02  0.00 - 0.07 K/uL Final   Performed at [facility], [place] [address], [place], [place] [address]   Sodium 07/25/2022 138  135 - 145 mmol/L Final   Potassium 07/25/2022 4.0  3.5 - 5.1 mmol/L Final   Chloride 07/25/2022 104  98 - 111 mmol/L Final   CO2 07/25/2022 29  22 - 32 mmol/L Final   Glucose, Bld 07/25/2022 83  70 - 99 mg/dL Final   Glucose reference range applies only to samples taken after fasting for at least 8 hours.   BUN 07/25/2022 11  6 - 20 mg/dL Final   Creatinine, Ser 07/25/2022 0.97  0.44 - 1.00 mg/dL Final   Calcium 07/25/2022 9.2  8.9 - 10.3 mg/dL Final   Total Protein 07/25/2022 7.0  6.5 - 8.1 g/dL Final   Albumin 07/25/2022 3.8  3.5 - 5.0 g/dL Final   AST  07/25/2022 18  15 - 41 U/L Final   ALT 07/25/2022 22  0 - 44 U/L Final   Alkaline Phosphatase 07/25/2022 64  38 - 126 U/L Final   Total Bilirubin 07/25/2022 0.2 (L)  0.3 - 1.2 mg/dL Final   GFR, Estimated 07/25/2022 >60  >60 mL/min Final   Comment: (NOTE) Calculated using the CKD-EPI Creatinine Equation (2021)    Anion gap 07/25/2022 5  5 - 15 Final   Performed at [facility] [address], [place], [place] [address]   POC Amphetamine UR 07/25/2022 None Detected  NONE DETECTED (Cut Off Level 1000 ng/mL) Preliminary   POC Secobarbital (BAR) 07/25/2022 None Detected  NONE DETECTED (Cut Off Level 300 ng/mL) Preliminary   POC Buprenorphine (BUP) 07/25/2022 None Detected  NONE DETECTED (Cut Off Level 10 ng/mL) Preliminary   POC Oxazepam (BZO) 07/25/2022 None Detected  NONE DETECTED (Cut Off Level 300 ng/mL) Preliminary   POC Cocaine UR 07/25/2022 None Detected  NONE DETECTED (Cut Off Level 300 ng/mL) Preliminary   POC Methamphetamine UR 07/25/2022 None Detected  NONE DETECTED (Cut Off Level 1000 ng/mL) Preliminary   POC Morphine 07/25/2022 None Detected  NONE DETECTED (Cut Off Level 300 ng/mL)  Preliminary   POC Methadone UR 07/25/2022 None Detected  NONE DETECTED (Cut Off Level 300 ng/mL) Preliminary   POC Oxycodone UR 07/25/2022 None Detected  NONE DETECTED (Cut Off Level 100 ng/mL) Preliminary   POC Marijuana UR 07/25/2022 None Detected  NONE DETECTED (Cut Off Level 50 ng/mL) Preliminary   SARSCOV2ONAVIRUS 2 AG 07/25/2022 NEGATIVE  NEGATIVE Final   Comment: (NOTE) SARS-CoV-2 antigen NOT DETECTED.   Negative results are presumptive.  Negative results do not preclude SARS-CoV-2 infection and should not be used as the sole basis for treatment or other patient management decisions, including infection  control decisions, particularly in the presence of clinical signs and  symptoms consistent with COVID-19, or in those who have been in contact with the virus.  Negative results must be  combined with clinical observations, patient history, and epidemiological information. The expected result is Negative.  Fact Sheet for Patients: https://www.jennings-kim.com/  Fact Sheet for Healthcare Providers: https://alexander-rogers.biz/  This test is not yet approved or cleared by the [place] FDA and  has been authorized for detection and/or diagnosis of SARS-CoV-2 by FDA under an Emergency Use Authorization (EUA).  This EUA will remain in effect (meaning this test can be used) for the duration of  the COV                          ID-19 declaration under Section 564(b)(1) of the Act, 21 U.S.C. section 360bbb-3(b)(1), unless the authorization is terminated or revoked sooner.     Cholesterol 07/25/2022 181  0 - 200 mg/dL Final   Triglycerides 85/46/2703 118  <150 mg/dL Final   HDL 50/06/3817 45  >40 mg/dL Final   Total CHOL/HDL Ratio 07/25/2022 4.0  RATIO Final   VLDL 07/25/2022 24  0 - 40 mg/dL Final   LDL Cholesterol 07/25/2022 112 (H)  0 - 99 mg/dL Final   Comment:        Total Cholesterol/HDL:CHD Risk Coronary Heart Disease Risk Table                     Men   Women  1/2 Average Risk   3.4   3.3  Average Risk       5.0   4.4  2 X Average Risk   9.6   7.1  3 X Average Risk  23.4   11.0        Use the calculated Patient Ratio above and the CHD Risk Table to determine the patient's CHD Risk.        ATP III CLASSIFICATION (LDL):  <100     mg/dL   Optimal  299-371  mg/dL   Near or Above                    Optimal  130-159  mg/dL   Borderline  696-789  mg/dL   High  >381     mg/dL   Very High Performed at [facility], [address] [place], [place] [address]    TSH 07/25/2022 6.668 (H)  0.350 - 4.500 uIU/mL Final   Comment: Performed by a 3rd Generation assay with a functional sensitivity of <=0.01 uIU/mL. Performed at [facility], [address] [place], [place] [address]    Glucose-Capillary 07/26/2022 104 (H)  70  - 99 mg/dL Final   Glucose reference range applies only to samples taken after fasting for at least 8 hours.   T3, Free  07/31/2022 2.3  2.0 - 4.4 pg/mL Final   Comment: (NOTE) Performed At: [facility] [place], [place] [id number] Rush Farmer MD [id number]    Free T4 07/31/2022 0.60 (L)  0.61 - 1.12 ng/dL Final   Comment: (NOTE) Biotin ingestion may interfere with free T4 tests. If the results are inconsistent with the TSH level, previous test results, or the clinical presentation, then consider biotin interference. If needed, order repeat testing after stopping biotin. Performed at [facility], [place] [address], [place], [place] [address]   Admission on 07/04/2022, Discharged on 07/05/2022  Component Date Value Ref Range Status   Sodium 07/04/2022 142  135 - 145 mmol/L Final   Potassium 07/04/2022 4.4  3.5 - 5.1 mmol/L Final   Chloride 07/04/2022 109  98 - 111 mmol/L Final   CO2 07/04/2022 26  22 - 32 mmol/L Final   Glucose, Bld 07/04/2022 96  70 - 99 mg/dL Final   Glucose reference range applies only to samples taken after fasting for at least 8 hours.   BUN 07/04/2022 15  6 - 20 mg/dL Final   Creatinine, Ser 07/04/2022 0.83  0.44 - 1.00 mg/dL Final   Calcium 07/04/2022 9.3  8.9 - 10.3 mg/dL Final   Total Protein 07/04/2022 7.2  6.5 - 8.1 g/dL Final   Albumin 07/04/2022 3.9  3.5 - 5.0 g/dL Final   AST 07/04/2022 23  15 - 41 U/L Final   ALT 07/04/2022 28  0 - 44 U/L Final   Alkaline Phosphatase 07/04/2022 77  38 - 126 U/L Final   Total Bilirubin 07/04/2022 0.4  0.3 - 1.2 mg/dL Final   GFR, Estimated 07/04/2022 >60  >60 mL/min Final   Comment: (NOTE) Calculated using the CKD-EPI Creatinine Equation (2021)    Anion gap 07/04/2022 7  5 - 15 Final   Performed at [facility], [place] [address] [place], [place] [address]   Alcohol, Ethyl (B) 07/04/2022 <10  <10 mg/dL Final   Comment: (NOTE) Lowest detectable limit for serum  alcohol is 10 mg/dL.  For medical purposes only. Performed at [facility], [place] [address], [place], [place] [address]    WBC 07/04/2022 7.0  4.0 - 10.5 K/uL Final   RBC 07/04/2022 4.18  3.87 - 5.11 MIL/uL Final   Hemoglobin 07/04/2022 12.8  12.0 - 15.0 g/dL Final   HCT 07/04/2022 39.1  36.0 - 46.0 % Final   MCV 07/04/2022 93.5  80.0 - 100.0 fL Final   MCH 07/04/2022 30.6  26.0 - 34.0 pg Final   MCHC 07/04/2022 32.7  30.0 - 36.0 g/dL Final   RDW 07/04/2022 12.9  11.5 - 15.5 % Final   Platelets 07/04/2022 243  150 - 400 K/uL Final   nRBC 07/04/2022 0.0  0.0 - 0.2 % Final   Neutrophils Relative % 07/04/2022 43  % Final   Neutro Abs 07/04/2022 3.0  1.7 - 7.7 K/uL Final   Lymphocytes Relative 07/04/2022 50  % Final   Lymphs Abs 07/04/2022 3.5  0.7 - 4.0 K/uL Final   Monocytes Relative 07/04/2022 7  % Final   Monocytes Absolute 07/04/2022 0.5  0.1 - 1.0 K/uL Final   Eosinophils Relative 07/04/2022 0  % Final   Eosinophils Absolute 07/04/2022 0.0  0.0 - 0.5 K/uL Final   Basophils Relative 07/04/2022 0  % Final   Basophils Absolute 07/04/2022 0.0  0.0 - 0.1 K/uL Final   Immature Granulocytes 07/04/2022 0  %  Final   Abs Immature Granulocytes 07/04/2022 0.01  0.00 - 0.07 K/uL Final   Performed at [facility] [address] [place], [place] [address]   I-stat hCG, quantitative 07/04/2022 <5.0  <5 mIU/mL Final   Comment 3 07/04/2022          Final   Comment:   GEST. AGE      CONC.  (mIU/mL)   <=1 WEEK        5 - 50     2 WEEKS       50 - 500     3 WEEKS       100 - 10,000     4 WEEKS     1,000 - 30,000        FEMALE AND NON-PREGNANT FEMALE:     LESS THAN 5 mIU/mL   Admission on 06/14/2022, Discharged on 06/14/2022  Component Date Value Ref Range Status   Color, UA 06/14/2022 yellow  yellow Final   Clarity, UA 06/14/2022 cloudy (A)  clear Final   Glucose, UA 06/14/2022 negative  negative mg/dL Final   Bilirubin, UA 06/14/2022 negative  negative  Final   Ketones, POC UA 06/14/2022 negative  negative mg/dL Final   Spec Grav, UA 06/14/2022 1.025  1.010 - 1.025 Final   Blood, UA 06/14/2022 negative  negative Final   pH, UA 06/14/2022 7.5  5.0 - 8.0 Final   Protein Ur, POC 06/14/2022 =30 (A)  negative mg/dL Final   Urobilinogen, UA 06/14/2022 0.2  0.2 or 1.0 E.U./dL Final   Nitrite, UA 06/14/2022 Negative  Negative Final   Leukocytes, UA 06/14/2022 Negative  Negative Final   Preg Test, Ur 06/14/2022 Negative  Negative Final   Specimen Description 06/14/2022 URINE, CLEAN CATCH   Final   Special Requests 06/14/2022    Final                   Value:NONE Performed at [facility], [place] [address] [place], [place] [address]    Culture 06/14/2022 MULTIPLE SPECIES PRESENT, SUGGEST RECOLLECTION (A)   Final   Report Status 06/14/2022 06/16/2022 FINAL   Final  Admission on 06/07/2022, Discharged on 06/10/2022  Component Date Value Ref Range Status   SARS Coronavirus 2 by RT PCR 06/07/2022 NEGATIVE  NEGATIVE Final   Comment: (NOTE) SARS-CoV-2 target nucleic acids are NOT DETECTED.  The SARS-CoV-2 RNA is generally detectable in upper respiratory specimens during the acute phase of infection. The lowest concentration of SARS-CoV-2 viral copies this assay can detect is 138 copies/mL. A negative result does not preclude SARS-Cov-2 infection and should not be used as the sole basis for treatment or other patient management decisions. A negative result may occur with  improper specimen collection/handling, submission of specimen other than nasopharyngeal swab, presence of viral mutation(s) within the areas targeted by this assay, and inadequate number of viral copies(<138 copies/mL). A negative result must be combined with clinical observations, patient history, and epidemiological information. The expected result is Negative.  Fact Sheet for Patients:  EntrepreneurPulse.com.au  Fact Sheet for Healthcare Providers:   IncredibleEmployment.be  This test is no                          t yet approved or cleared by the [place] FDA and  has been authorized for detection and/or diagnosis of SARS-CoV-2 by FDA under an Emergency Use Authorization (EUA). This EUA will remain  in effect (  meaning this test can be used) for the duration of the COVID-19 declaration under Section 564(b)(1) of the Act, 21 U.S.C.section 360bbb-3(b)(1), unless the authorization is terminated  or revoked sooner.       Influenza A by PCR 06/07/2022 NEGATIVE  NEGATIVE Final   Influenza B by PCR 06/07/2022 NEGATIVE  NEGATIVE Final   Comment: (NOTE) The Xpert Xpress SARS-CoV-2/FLU/RSV plus assay is intended as an aid in the diagnosis of influenza from Nasopharyngeal swab specimens and should not be used as a sole basis for treatment. Nasal washings and aspirates are unacceptable for Xpert Xpress SARS-CoV-2/FLU/RSV testing.  Fact Sheet for Patients: BloggerCourse.com  Fact Sheet for Healthcare Providers: SeriousBroker.it  This test is not yet approved or cleared by the [place] FDA and has been authorized for detection and/or diagnosis of SARS-CoV-2 by FDA under an Emergency Use Authorization (EUA). This EUA will remain in effect (meaning this test can be used) for the duration of the COVID-19 declaration under Section 564(b)(1) of the Act, 21 U.S.C. section 360bbb-3(b)(1), unless the authorization is terminated or revoked.  Performed at [facility], [address], [place], [place] [address]    WBC 06/07/2022 5.7  4.0 - 10.5 K/uL Final   RBC 06/07/2022 4.39  3.87 - 5.11 MIL/uL Final   Hemoglobin 06/07/2022 13.2  12.0 - 15.0 g/dL Final   HCT 35/00/9381 40.4  36.0 - 46.0 % Final   MCV 06/07/2022 92.0  80.0 - 100.0 fL Final   MCH 06/07/2022 30.1  26.0 - 34.0 pg Final   MCHC 06/07/2022 32.7  30.0 - 36.0 g/dL Final   RDW 82/99/3716 12.4  11.5  - 15.5 % Final   Platelets 06/07/2022 308  150 - 400 K/uL Final   nRBC 06/07/2022 0.0  0.0 - 0.2 % Final   Neutrophils Relative % 06/07/2022 42  % Final   Neutro Abs 06/07/2022 2.4  1.7 - 7.7 K/uL Final   Lymphocytes Relative 06/07/2022 54  % Final   Lymphs Abs 06/07/2022 3.1  0.7 - 4.0 K/uL Final   Monocytes Relative 06/07/2022 4  % Final   Monocytes Absolute 06/07/2022 0.2  0.1 - 1.0 K/uL Final   Eosinophils Relative 06/07/2022 0  % Final   Eosinophils Absolute 06/07/2022 0.0  0.0 - 0.5 K/uL Final   Basophils Relative 06/07/2022 0  % Final   Basophils Absolute 06/07/2022 0.0  0.0 - 0.1 K/uL Final   Immature Granulocytes 06/07/2022 0  % Final   Abs Immature Granulocytes 06/07/2022 0.01  0.00 - 0.07 K/uL Final   Performed at [facility], [address], [place], [place] [address]   Sodium 06/07/2022 139  135 - 145 mmol/L Final   Potassium 06/07/2022 4.0  3.5 - 5.1 mmol/L Final   Chloride 06/07/2022 104  98 - 111 mmol/L Final   CO2 06/07/2022 28  22 - 32 mmol/L Final   Glucose, Bld 06/07/2022 104 (H)  70 - 99 mg/dL Final   Glucose reference range applies only to samples taken after fasting for at least 8 hours.   BUN 06/07/2022 8  6 - 20 mg/dL Final   Creatinine, Ser 06/07/2022 0.84  0.44 - 1.00 mg/dL Final   Calcium 38/07/1750 9.1  8.9 - 10.3 mg/dL Final   Total Protein 02/58/5277 6.9  6.5 - 8.1 g/dL Final   Albumin 82/42/3536 3.7  3.5 - 5.0 g/dL Final   AST 14/43/1540 19  15 - 41 U/L Final   ALT 06/07/2022 22  0 - 44 U/L Final   Alkaline Phosphatase 06/07/2022 54  38 - 126 U/L Final   Total Bilirubin 06/07/2022 0.4  0.3 - 1.2 mg/dL Final   GFR, Estimated 06/07/2022 >60  >60 mL/min Final   Comment: (NOTE) Calculated using the CKD-EPI Creatinine Equation (2021)    Anion gap 06/07/2022 7  5 - 15 Final   Performed at [facility], [address], [place], [place] [address]   Hgb A1c MFr Bld 06/07/2022 5.0  4.8 - 5.6 % Final   Comment: (NOTE) Pre diabetes:           5.7%-6.4%  Diabetes:              >6.4%  Glycemic control for   <7.0% adults with diabetes    Mean Plasma Glucose 06/07/2022 96.8  mg/dL Final   Performed at [facility], [address], [place], [place] [address]   TSH 06/07/2022 1.620  0.350 - 4.500 uIU/mL Final   Comment: Performed by a 3rd Generation assay with a functional sensitivity of <=0.01 uIU/mL. Performed at [facility], [address] [place], [place] [address]    RPR Ser Ql 06/07/2022 NON REACTIVE  NON REACTIVE Final   Performed at [facility], [address], [place], [place] [address]   Color, Urine 06/07/2022 YELLOW  YELLOW Final   APPearance 06/07/2022 HAZY (A)  CLEAR Final   Specific Gravity, Urine 06/07/2022 1.018  1.005 - 1.030 Final   pH 06/07/2022 7.0  5.0 - 8.0 Final   Glucose, UA 06/07/2022 NEGATIVE  NEGATIVE mg/dL Final   Hgb urine dipstick 06/07/2022 NEGATIVE  NEGATIVE Final   Bilirubin Urine 06/07/2022 NEGATIVE  NEGATIVE Final   Ketones, ur 06/07/2022 NEGATIVE  NEGATIVE mg/dL Final   Protein, ur 32/20/2542 NEGATIVE  NEGATIVE mg/dL Final   Nitrite 70/62/3762 NEGATIVE  NEGATIVE Final   Leukocytes,Ua 06/07/2022 NEGATIVE  NEGATIVE Final   Performed at [facility], [address] [place], [place] [address]   Cholesterol 06/07/2022 164  0 - 200 mg/dL Final   Triglycerides 76/16/0737 158 (H)  <150 mg/dL Final   HDL 10/62/6948 44  >40 mg/dL Final   Total CHOL/HDL Ratio 06/07/2022 3.7  RATIO Final   VLDL 06/07/2022 32  0 - 40 mg/dL Final   LDL Cholesterol 06/07/2022 88  0 - 99 mg/dL Final   Comment:        Total Cholesterol/HDL:CHD Risk Coronary Heart Disease Risk Table                     Men   Women  1/2 Average Risk   3.4   3.3  Average Risk       5.0   4.4  2 X Average Risk   9.6   7.1  3 X Average Risk  23.4   11.0        Use the calculated Patient Ratio above and the CHD Risk Table to determine the patient's CHD Risk.        ATP III CLASSIFICATION (LDL):  <100      mg/dL   Optimal  546-270  mg/dL   Near or Above                    Optimal  130-159  mg/dL   Borderline  350-093  mg/dL   High  >818     mg/dL   Very High Performed at [facility],  [address] [place], [place] [address]    HIV Screen 4th Generation wRfx 06/07/2022 Non Reactive  Non Reactive Final   Performed at [facility], [place] [address], [place], [place] [address]   SARSCOV2ONAVIRUS 2 AG 06/07/2022 NEGATIVE  NEGATIVE Final   Comment: (NOTE) SARS-CoV-2 antigen NOT DETECTED.   Negative results are presumptive.  Negative results do not preclude SARS-CoV-2 infection and should not be used as the sole basis for treatment or other patient management decisions, including infection  control decisions, particularly in the presence of clinical signs and  symptoms consistent with COVID-19, or in those who have been in contact with the virus.  Negative results must be combined with clinical observations, patient history, and epidemiological information. The expected result is Negative.  Fact Sheet for Patients: HandmadeRecipes.com.cy  Fact Sheet for Healthcare Providers: FuneralLife.at  This test is not yet approved or cleared by the [place] FDA and  has been authorized for detection and/or diagnosis of SARS-CoV-2 by FDA under an Emergency Use Authorization (EUA).  This EUA will remain in effect (meaning this test can be used) for the duration of  the COV                          ID-19 declaration under Section 564(b)(1) of the Act, 21 U.S.C. section 360bbb-3(b)(1), unless the authorization is terminated or revoked sooner.     POC Amphetamine UR 06/07/2022 None Detected  NONE DETECTED (Cut Off Level 1000 ng/mL) Final   POC Secobarbital (BAR) 06/07/2022 None Detected  NONE DETECTED (Cut Off Level 300 ng/mL) Final   POC Buprenorphine (BUP) 06/07/2022 None Detected  NONE DETECTED (Cut Off Level 10 ng/mL) Final   POC  Oxazepam (BZO) 06/07/2022 None Detected  NONE DETECTED (Cut Off Level 300 ng/mL) Final   POC Cocaine UR 06/07/2022 None Detected  NONE DETECTED (Cut Off Level 300 ng/mL) Final   POC Methamphetamine UR 06/07/2022 None Detected  NONE DETECTED (Cut Off Level 1000 ng/mL) Final   POC Morphine 06/07/2022 None Detected  NONE DETECTED (Cut Off Level 300 ng/mL) Final   POC Methadone UR 06/07/2022 None Detected  NONE DETECTED (Cut Off Level 300 ng/mL) Final   POC Oxycodone UR 06/07/2022 None Detected  NONE DETECTED (Cut Off Level 100 ng/mL) Final   POC Marijuana UR 06/07/2022 None Detected  NONE DETECTED (Cut Off Level 50 ng/mL) Final   Preg Test, Ur 06/08/2022 NEGATIVE  NEGATIVE Final   Comment:        THE SENSITIVITY OF THIS METHODOLOGY IS >20 mIU/mL. Performed at [facility], [place] [address], [place], [place] [address]    Preg Test, Ur 06/08/2022 NEGATIVE  NEGATIVE Final   Comment:        THE SENSITIVITY OF THIS METHODOLOGY IS >24 mIU/mL     Blood Alcohol level:  Lab Results  Component Value Date   ETH <10 07/04/2022   ETH <10 [id number]    Metabolic Disorder Labs: Lab Results  Component Value Date   HGBA1C 5.0 06/07/2022   MPG 96.8 06/07/2022   MPG 96.8 02/07/2022   No results found for: "PROLACTIN" Lab Results  Component Value Date   CHOL 181 07/25/2022   TRIG 118 07/25/2022   HDL 45 07/25/2022   CHOLHDL 4.0 07/25/2022   VLDL 24 07/25/2022   LDLCALC 112 (H) 07/25/2022   LDLCALC 88 06/07/2022    Therapeutic Lab Levels: No results found for: "LITHIUM"  No results found for: "VALPROATE" No results found for: "CBMZ"  Physical Findings   GAD-7    Flowsheet Row Office Visit from 12/17/2021 in [place]  Total GAD-7 Score 17      PHQ2-9    [place] [facility] from 06/07/2022 in [facility] Office Visit from 12/17/2021 in [place]  PHQ-2 Total Score 1 2  PHQ-9 Total Score 2 8       [place] [facility] from 07/25/2022 in [facility] from 07/04/2022 in [place] [facility] ED from 06/14/2022 in [facility] [place] High Risk No Risk No Risk        Musculoskeletal  Strength & Muscle Tone: within normal limits Gait & Station: normal Patient leans: N/A  Psychiatric Specialty Exam  Presentation  General Appearance:  Appropriate for Environment  Eye Contact: Good  Speech: Clear and Coherent; Normal Rate  Speech Volume: Normal  Handedness: Right   Mood and Affect  Mood: Euthymic  Affect: Appropriate; Congruent   Thought Process  Thought Processes: Coherent; Goal Directed  Descriptions of Associations:Intact  Orientation:Full (Time, Place and Person)  Thought Content:Logical  Diagnosis of Schizophrenia or Schizoaffective disorder in past: No    Hallucinations:Hallucinations: None  Ideas of Reference:None  Suicidal Thoughts:Suicidal Thoughts: No  Homicidal Thoughts:Homicidal Thoughts: No   Sensorium  Memory: Immediate Good; Recent Good  Judgment: Intact  Insight: Present   Executive Functions  Concentration: Fair  Attention Span: Fair  Recall: [employer] of Knowledge: Fair  Language: Fair   Psychomotor Activity  Psychomotor Activity: Psychomotor Activity: Normal   Assets  Assets: Communication Skills; Desire for Improvement   Sleep  Sleep: Sleep: Good   Nutritional Assessment (For OBS and FBC admissions only) Has the patient had a weight loss or gain of 10 pounds or more in the last 3 months?: No Has the patient had a decrease in food intake/or appetite?: No Does the patient have dental problems?: No Does the patient have eating habits or behaviors that may be indicators of an eating disorder including binging or inducing vomiting?: No Has the patient recently lost weight without trying?: 0 Has the  patient been eating poorly because of a decreased appetite?: 0 Malnutrition Screening Tool Score: 0   Physical Exam  Physical Exam Constitutional:      General: She is not in acute distress.    Appearance: She is not ill-appearing, toxic-appearing or diaphoretic.  Eyes:     General: No scleral icterus. Cardiovascular:     Rate and Rhythm: Normal rate.  Pulmonary:     Effort: Pulmonary effort is normal. No respiratory distress.  Neurological:     Mental Status: She is alert and oriented to person, place, and time.  Psychiatric:        Attention and Perception: Attention and perception normal.        Mood and Affect: Mood and affect normal.        Speech: Speech normal.        Behavior: Behavior normal.        Thought Content: Thought content normal.    Review of Systems  Constitutional:  Negative for chills and fever.  Respiratory:  Negative for shortness of breath.   Cardiovascular:  Negative for chest pain and palpitations.  Gastrointestinal:  Negative for abdominal pain.  Neurological:  Negative for headaches.  Psychiatric/Behavioral:  Depression: Stable. Hallucinations: Denies. Suicidal ideas: Denies. Nervous/anxious: Stable.    Blood pressure 113/87, pulse (!) 104, temperature 97.8 F (36.6 C), temperature source Oral, resp. rate 18, SpO2 100 %. There is no height or weight on file to calculate BMI.  Treatment Plan Summary: Plan Pt remains psychiatrically cleared. Disposition is pending placement. Social work to continue working with DSS for appropriate placement  [employer], NP 08/27/2022 1:31 PM

## 2022-08-27 NOTE — ED Notes (Signed)
Patient watching TV. Respirations equal and unlabored, skin warm and dry, NAD. Routine safety checks conducted according to facility protocol. Will continue to monitor for safety.  

## 2022-08-27 NOTE — ED Notes (Signed)
Patient resting quietly in bed with eyes closed. Respirations equal and unlabored, skin warm and dry, NAD. Routine safety checks conducted according to facility protocol. Will continue to monitor for safety.  

## 2022-08-28 NOTE — ED Notes (Signed)
Patient resting quietly in bed with eyes closed. Respirations equal and unlabored, skin warm and dry, NAD. Routine safety checks conducted according to facility protocol. Will continue to monitor for safety.  

## 2022-08-28 NOTE — ED Notes (Signed)
Lunch given.

## 2022-08-28 NOTE — ED Notes (Signed)
Pt sleeping at present, no distress noted.  Monitoring for safety. 

## 2022-08-28 NOTE — Progress Notes (Signed)
[facility] called around to research openings for group home for the client.    [facility] also called [name], prior group home owner.  [facility] informed her she forwarded her contact information to Stanford Scotland, [facility] Referral Specialist.  A Treatment Team Meeting will be on 17 November @ 1:00pm will also be held to discuss updates.    [name] [facility]

## 2022-08-28 NOTE — Progress Notes (Signed)
[name] Progress Note   [name] received email from Ms. Stanford Scotland, Residential Referral Specialist @ [address] [facility] regarding additional documentation needed for the application sent earlier last week.  [name] replied to Ms. Bishop as well as reaching out to Ms. Elmarie Mainland at [facility].  [name] shared the [facility] list of Mental Health Facilities with [name].  Placements contacted:  [facility] - [phone] 02/11/1053 - Informed that I/DD must be the primary diagnosis if there is a co-occurring MH diagnosis.  [facility] - [phone]  - 1144 - Call could not be completed.  [place], [place] - [phone]  - 1150 - Left a voicemail  [facility] [place], [place]) [place] [phone] 1150 - No answer/could not leave a VM  [facility] - [phone]   - No answer/could not leave a VM  [facility] - [phone] [birth year] - Information passed on to the director of the home with instructions to contact Elmarie Mainland, [facility]    02/12/1216 Verna Czech from [facility] and [employer] here to speak with the pt.   Hansel Starling, MSW, [name] [facility] [phone] phone [phone] fax

## 2022-08-28 NOTE — ED Notes (Signed)
[name]&Ox4. Patient denies SI/HI and AVH. Patient denies any physical complaints when asked. No acute distress noted. Support and encouragement provided. Routine safety checks conducted according to facility protocol. Encouraged patient to notify staff if thoughts of harm toward self or others arise. Patient verbalize understanding and agreement. Will continue to monitor for safety.    

## 2022-08-28 NOTE — ED Notes (Signed)
Patient speaking with [facility] in room 134.

## 2022-08-28 NOTE — ED Notes (Signed)
Pt is in the bed watching TV. Respirations are even and unlabored. No acute distress noted. Will continue to monitor for safety.  

## 2022-08-28 NOTE — Progress Notes (Signed)
[facility] called Belinda Bishop and left a VM.  This a copy of the email she sent below:  "Good afternoon all,  Looking into Lat. McC. file in our data base, the only document that I have is her screening. Below is a list of documents that are needed to complete her application for review.  In order to complete the full application, we will need to have digital copies of the following documents, as they apply to the case.  CCA less than 29 years old/addendum with recommended level of care (required for all Mental Health Group Homes or Apartments) Mental Health treatment records (for Mental Health Group Homes or Apartments) Mental Health hospital records (for Mental Health Group Homes or Apartments) I/DD treatment records I/DD hospital records Psychological testing records IQ testing records (if applicable/available) Medical records Legal records (if applicable) Any other relevant documentation Additionally, the application will ask for information about any mental health, I/DD, or medical diagnosis, current medications, hospital admissions, treatments and providers, and criminal history. If you have any questions about this application please feel free to reach out, or return the completed application via the email listed below.  Thank you,  Stanford Scotland  Residential Referral Specialist  [address] [place] [address] [phone]  Cell [phone]  X [phone] Office [email or web address]"

## 2022-08-28 NOTE — ED Provider Notes (Signed)
Behavioral Health Progress Note  Date and Time: 08/28/2022 9:29 AM Name: Nicole [name] MRN:  [id number]  Subjective:   Nicole [name] is a 31 y.o. female, with PMH of SCZ unspecified, MDD with psychosis, who presented voluntary to [facility] (07/25/2022) via [facility] after a verbal altercation with [name] (not patient's legal guardian) for transient SI.  This is patient's fourth visit to [facility] for similar concerns this year. Patient has been dismissed from previous group home due to threatening SI and HI, then leaving the group home.   Legal Guardian: Nicole [name]) transitioning to be Nicole [name]) Point of contact: Nicole [name])  Nicole [name], 31 y.o., female patient seen face to face by this provider, consulted with Dr. Nelly Rout; and chart reviewed on 08/28/22.  On evaluation Nicole [name] reports she is eating and sleeping without difficulty.  She denies suicidal/self-harm/homicidal ideation, psychosis, and paranoia.   During evaluation Nicole [name] is lying in bed with no noted distress.  She is alert, oriented x 4, calm, cooperative and attentive.  Her mood is euthymic with congruent affect.  She has normal speech, and behavior.  Objectively there is no evidence of psychosis/mania or delusional thinking.  Patient is able to converse coherently, goal directed thoughts, no distractibility, or pre-occupation.  She also denies suicidal/self-harm/homicidal ideation, psychosis, and paranoia.  Patient answered question appropriately.  Diagnosis:  Final diagnoses:  Severe episode of recurrent major depressive disorder, with psychotic features (HCC)    Total Time spent with patient: 15 minutes  Past Psychiatric History: SCZ unspecified, MDD with psychosis, Anxiety Past Medical History:  Past Medical History:  Diagnosis Date   Anxiety    Depression    Hypothyroidism 08/07/2022   Tobacco use disorder 08/07/2022    Past Surgical History:  Procedure Laterality Date    WISDOM TOOTH EXTRACTION Bilateral 2020   Family History:  Family History  Problem Relation Age of Onset   Hypertension Father    Diabetes Father    Family Psychiatric  History: None reported Social History:  Social History   Substance and Sexual Activity  Alcohol Use Never     Social History   Substance and Sexual Activity  Drug Use Never    Social History   Socioeconomic History   Marital status: Single    Spouse name: Not on file   Number of children: Not on file   Years of education: Not on file   Highest education level: Not on file  Occupational History   Not on file  Tobacco Use   Smoking status: Every Day    Types: Cigarettes   Smokeless tobacco: Never  Vaping Use   Vaping Use: Some days  Substance and Sexual Activity   Alcohol use: Never   Drug use: Never   Sexual activity: Yes    Partners: Female    Birth control/protection: Implant  Other Topics Concern   Not on file  Social History Narrative   Not on file   Social Determinants of Health   Financial Resource Strain: Not on file  Food Insecurity: Not on file  Transportation Needs: Not on file  Physical Activity: Not on file  Stress: Not on file  Social Connections: Not on file   SDOH:  SDOH Screenings   Depression (PHQ2-9): Low Risk  (06/10/2022)  Tobacco Use: High Risk (08/26/2022)   Additional Social History:    Pain Medications: SEE MAR Prescriptions: SEE MAR Over the Counter: SEE MAR History of alcohol / drug use?: Yes Longest  period of sobriety (when/how long): N/A Negative Consequences of Use:  (NONE) Withdrawal Symptoms: None Name of Substance 1: ETOH IN PAST--PT CURRENTLY DENIES 1 - Frequency: SOCIAL USE ETOH IN PAST 1 - Method of Aquiring: LEGAL 1- Route of Use: ORAL DRINK Name of Substance 2: NICOTINE-VAPE AND CIGARETTES 2 - Amount (size/oz): LESS THAN ONE PACK 2 - Frequency: DAILY 2 - Method of Aquiring: LEGAL 2 - Route of Substance Use: ORAL SMOKE                 Sleep: Good  Appetite:  Good  Current Medications:  Current Facility-Administered Medications  Medication Dose Route Frequency Provider Last Rate Last Admin   acetaminophen (TYLENOL) tablet 650 mg  650 mg Oral Q6H PRN Onuoha, Chinwendu V, NP   650 mg at 08/24/22 1336   alum & mag hydroxide-simeth (MAALOX/MYLANTA) 200-200-20 MG/5ML suspension 30 mL  30 mL Oral Q4H PRN Onuoha, Chinwendu V, NP   30 mL at 07/27/22 2151   ARIPiprazole ER (ABILIFY MAINTENA) injection 400 mg  400 mg Intramuscular Q28 days Princess Bruins, DO   400 mg at 08/02/22 1154   clotrimazole (LOTRIMIN) 1 % cream   Topical BID Bobbitt, Shalon E, NP   Given at 08/28/22 0923   hydrOXYzine (ATARAX) tablet 25 mg  25 mg Oral TID PRN Onuoha, Chinwendu V, NP   25 mg at 08/27/22 2233   ibuprofen (ADVIL) tablet 400 mg  400 mg Oral Q6H PRN Princess Bruins, DO   400 mg at 08/22/22 2027   levothyroxine (SYNTHROID) tablet 100 mcg  100 mcg Oral Q0600 Princess Bruins, DO   100 mcg at 08/28/22 8466   ziprasidone (GEODON) injection 20 mg  20 mg Intramuscular Q12H PRN Onuoha, Chinwendu V, NP       And   LORazepam (ATIVAN) tablet 1 mg  1 mg Oral PRN Onuoha, Chinwendu V, NP       magnesium hydroxide (MILK OF MAGNESIA) suspension 30 mL  30 mL Oral Daily PRN Onuoha, Chinwendu V, NP   30 mL at 08/22/22 1005   nicotine (NICODERM CQ - dosed in mg/24 hours) patch 14 mg  14 mg Transdermal Q0600 Onuoha, Chinwendu V, NP   14 mg at 08/28/22 0923   ondansetron (ZOFRAN-ODT) disintegrating tablet 4 mg  4 mg Oral Q8H PRN Sindy Guadeloupe, NP   4 mg at 08/28/22 0057   Oxcarbazepine (TRILEPTAL) tablet 300 mg  300 mg Oral BID Onuoha, Chinwendu V, NP   300 mg at 08/28/22 0922   pantoprazole (PROTONIX) EC tablet 20 mg  20 mg Oral Daily Princess Bruins, DO   20 mg at 08/28/22 5993   QUEtiapine (SEROQUEL) tablet 400 mg  400 mg Oral BID Onuoha, Chinwendu V, NP   400 mg at 08/28/22 5701   senna-docusate (Senokot-S) tablet 1 tablet  1 tablet Oral QHS Princess Bruins, DO   1  tablet at 08/27/22 2232   sertraline (ZOLOFT) tablet 150 mg  150 mg Oral Daily Onuoha, Chinwendu V, NP   150 mg at 08/28/22 0922   traZODone (DESYREL) tablet 50 mg  50 mg Oral QHS PRN Onuoha, Chinwendu V, NP   50 mg at 08/27/22 2232   valACYclovir (VALTREX) tablet 500 mg  500 mg Oral Daily Princess Bruins, DO   500 mg at 08/28/22 7793   Current Outpatient Medications  Medication Sig Dispense Refill   ABILIFY MAINTENA 400 MG PRSY prefilled syringe 400 mg every 28 (twenty-eight) days.     cetirizine (ZYRTEC)  10 MG tablet Take 10 mg by mouth daily.     cyclobenzaprine (FLEXERIL) 10 MG tablet Take 1 tablet (10 mg total) by mouth 2 (two) times daily as needed for muscle spasms. 20 tablet 0   fluticasone (FLONASE) 50 MCG/ACT nasal spray Place 1 spray into both nostrils daily.     meloxicam (MOBIC) 15 MG tablet Take 15 mg by mouth daily.     Oxcarbazepine (TRILEPTAL) 300 MG tablet Take 1 tablet (300 mg total) by mouth 2 (two) times daily. 60 tablet 0   QUEtiapine (SEROQUEL) 400 MG tablet Take 1 tablet (400 mg total) by mouth 2 (two) times daily. 60 tablet 0   sertraline (ZOLOFT) 50 MG tablet Take 3 tablets (150 mg total) by mouth in the morning. 90 tablet 0   traZODone (DESYREL) 100 MG tablet Take 1 tablet (100 mg total) by mouth at bedtime. 30 tablet 0   valACYclovir (VALTREX) 500 MG tablet Take 500 mg by mouth daily.      Labs  Lab Results:  Admission on 07/25/2022  Component Date Value Ref Range Status   SARS Coronavirus 2 by RT PCR 07/25/2022 NEGATIVE  NEGATIVE Final   Comment: (NOTE) SARS-CoV-2 target nucleic acids are NOT DETECTED.  The SARS-CoV-2 RNA is generally detectable in upper respiratory specimens during the acute phase of infection. The lowest concentration of SARS-CoV-2 viral copies this assay can detect is 138 copies/mL. A negative result does not preclude SARS-Cov-2 infection and should not be used as the sole basis for treatment or other patient management decisions. A  negative result may occur with  improper specimen collection/handling, submission of specimen other than nasopharyngeal swab, presence of viral mutation(s) within the areas targeted by this assay, and inadequate number of viral copies(<138 copies/mL). A negative result must be combined with clinical observations, patient history, and epidemiological information. The expected result is Negative.  Fact Sheet for Patients:  EntrepreneurPulse.com.au  Fact Sheet for Healthcare Providers:  IncredibleEmployment.be  This test is no                          t yet approved or cleared by the [place] FDA and  has been authorized for detection and/or diagnosis of SARS-CoV-2 by FDA under an Emergency Use Authorization (EUA). This EUA will remain  in effect (meaning this test can be used) for the duration of the COVID-19 declaration under Section 564(b)(1) of the Act, 21 U.S.C.section 360bbb-3(b)(1), unless the authorization is terminated  or revoked sooner.       Influenza A by PCR 07/25/2022 NEGATIVE  NEGATIVE Final   Influenza B by PCR 07/25/2022 NEGATIVE  NEGATIVE Final   Comment: (NOTE) The Xpert Xpress SARS-CoV-2/FLU/RSV plus assay is intended as an aid in the diagnosis of influenza from Nasopharyngeal swab specimens and should not be used as a sole basis for treatment. Nasal washings and aspirates are unacceptable for Xpert Xpress SARS-CoV-2/FLU/RSV testing.  Fact Sheet for Patients: EntrepreneurPulse.com.au  Fact Sheet for Healthcare Providers: IncredibleEmployment.be  This test is not yet approved or cleared by the [place] FDA and has been authorized for detection and/or diagnosis of SARS-CoV-2 by FDA under an Emergency Use Authorization (EUA). This EUA will remain in effect (meaning this test can be used) for the duration of the COVID-19 declaration under Section 564(b)(1) of the Act, 21  U.S.C. section 360bbb-3(b)(1), unless the authorization is terminated or revoked.  Performed at [facility], [place],  [place] [address]    WBC 07/25/2022 8.3  4.0 - 10.5 K/uL Final   RBC 07/25/2022 4.44  3.87 - 5.11 MIL/uL Final   Hemoglobin 07/25/2022 13.7  12.0 - 15.0 g/dL Final   HCT 07/25/2022 40.2  36.0 - 46.0 % Final   MCV 07/25/2022 90.5  80.0 - 100.0 fL Final   MCH 07/25/2022 30.9  26.0 - 34.0 pg Final   MCHC 07/25/2022 34.1  30.0 - 36.0 g/dL Final   RDW 07/25/2022 12.2  11.5 - 15.5 % Final   Platelets 07/25/2022 248  150 - 400 K/uL Final   nRBC 07/25/2022 0.0  0.0 - 0.2 % Final   Neutrophils Relative % 07/25/2022 43  % Final   Neutro Abs 07/25/2022 3.6  1.7 - 7.7 K/uL Final   Lymphocytes Relative 07/25/2022 52  % Final   Lymphs Abs 07/25/2022 4.2 (H)  0.7 - 4.0 K/uL Final   Monocytes Relative 07/25/2022 5  % Final   Monocytes Absolute 07/25/2022 0.4  0.1 - 1.0 K/uL Final   Eosinophils Relative 07/25/2022 0  % Final   Eosinophils Absolute 07/25/2022 0.0  0.0 - 0.5 K/uL Final   Basophils Relative 07/25/2022 0  % Final   Basophils Absolute 07/25/2022 0.0  0.0 - 0.1 K/uL Final   Immature Granulocytes 07/25/2022 0  % Final   Abs Immature Granulocytes 07/25/2022 0.02  0.00 - 0.07 K/uL Final   Performed at [facility], [place] [address], [place], [place] [address]   Sodium 07/25/2022 138  135 - 145 mmol/L Final   Potassium 07/25/2022 4.0  3.5 - 5.1 mmol/L Final   Chloride 07/25/2022 104  98 - 111 mmol/L Final   CO2 07/25/2022 29  22 - 32 mmol/L Final   Glucose, Bld 07/25/2022 83  70 - 99 mg/dL Final   Glucose reference range applies only to samples taken after fasting for at least 8 hours.   BUN 07/25/2022 11  6 - 20 mg/dL Final   Creatinine, Ser 07/25/2022 0.97  0.44 - 1.00 mg/dL Final   Calcium 07/25/2022 9.2  8.9 - 10.3 mg/dL Final   Total Protein 07/25/2022 7.0  6.5 - 8.1 g/dL Final   Albumin 07/25/2022 3.8  3.5 - 5.0 g/dL Final   AST  07/25/2022 18  15 - 41 U/L Final   ALT 07/25/2022 22  0 - 44 U/L Final   Alkaline Phosphatase 07/25/2022 64  38 - 126 U/L Final   Total Bilirubin 07/25/2022 0.2 (L)  0.3 - 1.2 mg/dL Final   GFR, Estimated 07/25/2022 >60  >60 mL/min Final   Comment: (NOTE) Calculated using the CKD-EPI Creatinine Equation (2021)    Anion gap 07/25/2022 5  5 - 15 Final   Performed at [facility] [address], [place], [place] [address]   POC Amphetamine UR 07/25/2022 None Detected  NONE DETECTED (Cut Off Level 1000 ng/mL) Preliminary   POC Secobarbital (BAR) 07/25/2022 None Detected  NONE DETECTED (Cut Off Level 300 ng/mL) Preliminary   POC Buprenorphine (BUP) 07/25/2022 None Detected  NONE DETECTED (Cut Off Level 10 ng/mL) Preliminary   POC Oxazepam (BZO) 07/25/2022 None Detected  NONE DETECTED (Cut Off Level 300 ng/mL) Preliminary   POC Cocaine UR 07/25/2022 None Detected  NONE DETECTED (Cut Off Level 300 ng/mL) Preliminary   POC Methamphetamine UR 07/25/2022 None Detected  NONE DETECTED (Cut Off Level 1000 ng/mL) Preliminary   POC Morphine 07/25/2022 None Detected  NONE DETECTED (Cut Off Level 300 ng/mL)  Preliminary   POC Methadone UR 07/25/2022 None Detected  NONE DETECTED (Cut Off Level 300 ng/mL) Preliminary   POC Oxycodone UR 07/25/2022 None Detected  NONE DETECTED (Cut Off Level 100 ng/mL) Preliminary   POC Marijuana UR 07/25/2022 None Detected  NONE DETECTED (Cut Off Level 50 ng/mL) Preliminary   SARSCOV2ONAVIRUS 2 AG 07/25/2022 NEGATIVE  NEGATIVE Final   Comment: (NOTE) SARS-CoV-2 antigen NOT DETECTED.   Negative results are presumptive.  Negative results do not preclude SARS-CoV-2 infection and should not be used as the sole basis for treatment or other patient management decisions, including infection  control decisions, particularly in the presence of clinical signs and  symptoms consistent with COVID-19, or in those who have been in contact with the virus.  Negative results must be  combined with clinical observations, patient history, and epidemiological information. The expected result is Negative.  Fact Sheet for Patients: https://www.jennings-kim.com/  Fact Sheet for Healthcare Providers: https://alexander-rogers.biz/  This test is not yet approved or cleared by the [place] FDA and  has been authorized for detection and/or diagnosis of SARS-CoV-2 by FDA under an Emergency Use Authorization (EUA).  This EUA will remain in effect (meaning this test can be used) for the duration of  the COV                          ID-19 declaration under Section 564(b)(1) of the Act, 21 U.S.C. section 360bbb-3(b)(1), unless the authorization is terminated or revoked sooner.     Cholesterol 07/25/2022 181  0 - 200 mg/dL Final   Triglycerides 85/46/2703 118  <150 mg/dL Final   HDL 50/06/3817 45  >40 mg/dL Final   Total CHOL/HDL Ratio 07/25/2022 4.0  RATIO Final   VLDL 07/25/2022 24  0 - 40 mg/dL Final   LDL Cholesterol 07/25/2022 112 (H)  0 - 99 mg/dL Final   Comment:        Total Cholesterol/HDL:CHD Risk Coronary Heart Disease Risk Table                     Men   Women  1/2 Average Risk   3.4   3.3  Average Risk       5.0   4.4  2 X Average Risk   9.6   7.1  3 X Average Risk  23.4   11.0        Use the calculated Patient Ratio above and the CHD Risk Table to determine the patient's CHD Risk.        ATP III CLASSIFICATION (LDL):  <100     mg/dL   Optimal  299-371  mg/dL   Near or Above                    Optimal  130-159  mg/dL   Borderline  696-789  mg/dL   High  >381     mg/dL   Very High Performed at [facility], [address] [place], [place] [address]    TSH 07/25/2022 6.668 (H)  0.350 - 4.500 uIU/mL Final   Comment: Performed by a 3rd Generation assay with a functional sensitivity of <=0.01 uIU/mL. Performed at [facility], [address] [place], [place] [address]    Glucose-Capillary 07/26/2022 104 (H)  70  - 99 mg/dL Final   Glucose reference range applies only to samples taken after fasting for at least 8 hours.   T3, Free  07/31/2022 2.3  2.0 - 4.4 pg/mL Final   Comment: (NOTE) Performed At: [facility] [place], [place] [id number] Rush Farmer MD [id number]    Free T4 07/31/2022 0.60 (L)  0.61 - 1.12 ng/dL Final   Comment: (NOTE) Biotin ingestion may interfere with free T4 tests. If the results are inconsistent with the TSH level, previous test results, or the clinical presentation, then consider biotin interference. If needed, order repeat testing after stopping biotin. Performed at [facility], [place] [address], [place], [place] [address]   Admission on 07/04/2022, Discharged on 07/05/2022  Component Date Value Ref Range Status   Sodium 07/04/2022 142  135 - 145 mmol/L Final   Potassium 07/04/2022 4.4  3.5 - 5.1 mmol/L Final   Chloride 07/04/2022 109  98 - 111 mmol/L Final   CO2 07/04/2022 26  22 - 32 mmol/L Final   Glucose, Bld 07/04/2022 96  70 - 99 mg/dL Final   Glucose reference range applies only to samples taken after fasting for at least 8 hours.   BUN 07/04/2022 15  6 - 20 mg/dL Final   Creatinine, Ser 07/04/2022 0.83  0.44 - 1.00 mg/dL Final   Calcium 07/04/2022 9.3  8.9 - 10.3 mg/dL Final   Total Protein 07/04/2022 7.2  6.5 - 8.1 g/dL Final   Albumin 07/04/2022 3.9  3.5 - 5.0 g/dL Final   AST 07/04/2022 23  15 - 41 U/L Final   ALT 07/04/2022 28  0 - 44 U/L Final   Alkaline Phosphatase 07/04/2022 77  38 - 126 U/L Final   Total Bilirubin 07/04/2022 0.4  0.3 - 1.2 mg/dL Final   GFR, Estimated 07/04/2022 >60  >60 mL/min Final   Comment: (NOTE) Calculated using the CKD-EPI Creatinine Equation (2021)    Anion gap 07/04/2022 7  5 - 15 Final   Performed at [facility], [place] [address] [place], [place] [address]   Alcohol, Ethyl (B) 07/04/2022 <10  <10 mg/dL Final   Comment: (NOTE) Lowest detectable limit for serum  alcohol is 10 mg/dL.  For medical purposes only. Performed at [facility], [place] [address], [place], [place] [address]    WBC 07/04/2022 7.0  4.0 - 10.5 K/uL Final   RBC 07/04/2022 4.18  3.87 - 5.11 MIL/uL Final   Hemoglobin 07/04/2022 12.8  12.0 - 15.0 g/dL Final   HCT 07/04/2022 39.1  36.0 - 46.0 % Final   MCV 07/04/2022 93.5  80.0 - 100.0 fL Final   MCH 07/04/2022 30.6  26.0 - 34.0 pg Final   MCHC 07/04/2022 32.7  30.0 - 36.0 g/dL Final   RDW 07/04/2022 12.9  11.5 - 15.5 % Final   Platelets 07/04/2022 243  150 - 400 K/uL Final   nRBC 07/04/2022 0.0  0.0 - 0.2 % Final   Neutrophils Relative % 07/04/2022 43  % Final   Neutro Abs 07/04/2022 3.0  1.7 - 7.7 K/uL Final   Lymphocytes Relative 07/04/2022 50  % Final   Lymphs Abs 07/04/2022 3.5  0.7 - 4.0 K/uL Final   Monocytes Relative 07/04/2022 7  % Final   Monocytes Absolute 07/04/2022 0.5  0.1 - 1.0 K/uL Final   Eosinophils Relative 07/04/2022 0  % Final   Eosinophils Absolute 07/04/2022 0.0  0.0 - 0.5 K/uL Final   Basophils Relative 07/04/2022 0  % Final   Basophils Absolute 07/04/2022 0.0  0.0 - 0.1 K/uL Final   Immature Granulocytes 07/04/2022 0  %  Final   Abs Immature Granulocytes 07/04/2022 0.01  0.00 - 0.07 K/uL Final   Performed at [facility] [address] [place], [place] [address]   I-stat hCG, quantitative 07/04/2022 <5.0  <5 mIU/mL Final   Comment 3 07/04/2022          Final   Comment:   GEST. AGE      CONC.  (mIU/mL)   <=1 WEEK        5 - 50     2 WEEKS       50 - 500     3 WEEKS       100 - 10,000     4 WEEKS     1,000 - 30,000        FEMALE AND NON-PREGNANT FEMALE:     LESS THAN 5 mIU/mL   Admission on 06/14/2022, Discharged on 06/14/2022  Component Date Value Ref Range Status   Color, UA 06/14/2022 yellow  yellow Final   Clarity, UA 06/14/2022 cloudy (A)  clear Final   Glucose, UA 06/14/2022 negative  negative mg/dL Final   Bilirubin, UA 06/14/2022 negative  negative  Final   Ketones, POC UA 06/14/2022 negative  negative mg/dL Final   Spec Grav, UA 06/14/2022 1.025  1.010 - 1.025 Final   Blood, UA 06/14/2022 negative  negative Final   pH, UA 06/14/2022 7.5  5.0 - 8.0 Final   Protein Ur, POC 06/14/2022 =30 (A)  negative mg/dL Final   Urobilinogen, UA 06/14/2022 0.2  0.2 or 1.0 E.U./dL Final   Nitrite, UA 06/14/2022 Negative  Negative Final   Leukocytes, UA 06/14/2022 Negative  Negative Final   Preg Test, Ur 06/14/2022 Negative  Negative Final   Specimen Description 06/14/2022 URINE, CLEAN CATCH   Final   Special Requests 06/14/2022    Final                   Value:NONE Performed at [facility], [place] [address] [place], [place] [address]    Culture 06/14/2022 MULTIPLE SPECIES PRESENT, SUGGEST RECOLLECTION (A)   Final   Report Status 06/14/2022 06/16/2022 FINAL   Final  Admission on 06/07/2022, Discharged on 06/10/2022  Component Date Value Ref Range Status   SARS Coronavirus 2 by RT PCR 06/07/2022 NEGATIVE  NEGATIVE Final   Comment: (NOTE) SARS-CoV-2 target nucleic acids are NOT DETECTED.  The SARS-CoV-2 RNA is generally detectable in upper respiratory specimens during the acute phase of infection. The lowest concentration of SARS-CoV-2 viral copies this assay can detect is 138 copies/mL. A negative result does not preclude SARS-Cov-2 infection and should not be used as the sole basis for treatment or other patient management decisions. A negative result may occur with  improper specimen collection/handling, submission of specimen other than nasopharyngeal swab, presence of viral mutation(s) within the areas targeted by this assay, and inadequate number of viral copies(<138 copies/mL). A negative result must be combined with clinical observations, patient history, and epidemiological information. The expected result is Negative.  Fact Sheet for Patients:  EntrepreneurPulse.com.au  Fact Sheet for Healthcare Providers:   IncredibleEmployment.be  This test is no                          t yet approved or cleared by the [place] FDA and  has been authorized for detection and/or diagnosis of SARS-CoV-2 by FDA under an Emergency Use Authorization (EUA). This EUA will remain  in effect (  meaning this test can be used) for the duration of the COVID-19 declaration under Section 564(b)(1) of the Act, 21 U.S.C.section 360bbb-3(b)(1), unless the authorization is terminated  or revoked sooner.       Influenza A by PCR 06/07/2022 NEGATIVE  NEGATIVE Final   Influenza B by PCR 06/07/2022 NEGATIVE  NEGATIVE Final   Comment: (NOTE) The Xpert Xpress SARS-CoV-2/FLU/RSV plus assay is intended as an aid in the diagnosis of influenza from Nasopharyngeal swab specimens and should not be used as a sole basis for treatment. Nasal washings and aspirates are unacceptable for Xpert Xpress SARS-CoV-2/FLU/RSV testing.  Fact Sheet for Patients: BloggerCourse.com  Fact Sheet for Healthcare Providers: SeriousBroker.it  This test is not yet approved or cleared by the [place] FDA and has been authorized for detection and/or diagnosis of SARS-CoV-2 by FDA under an Emergency Use Authorization (EUA). This EUA will remain in effect (meaning this test can be used) for the duration of the COVID-19 declaration under Section 564(b)(1) of the Act, 21 U.S.C. section 360bbb-3(b)(1), unless the authorization is terminated or revoked.  Performed at [facility], [address], [place], [place] [address]    WBC 06/07/2022 5.7  4.0 - 10.5 K/uL Final   RBC 06/07/2022 4.39  3.87 - 5.11 MIL/uL Final   Hemoglobin 06/07/2022 13.2  12.0 - 15.0 g/dL Final   HCT 35/00/9381 40.4  36.0 - 46.0 % Final   MCV 06/07/2022 92.0  80.0 - 100.0 fL Final   MCH 06/07/2022 30.1  26.0 - 34.0 pg Final   MCHC 06/07/2022 32.7  30.0 - 36.0 g/dL Final   RDW 82/99/3716 12.4  11.5  - 15.5 % Final   Platelets 06/07/2022 308  150 - 400 K/uL Final   nRBC 06/07/2022 0.0  0.0 - 0.2 % Final   Neutrophils Relative % 06/07/2022 42  % Final   Neutro Abs 06/07/2022 2.4  1.7 - 7.7 K/uL Final   Lymphocytes Relative 06/07/2022 54  % Final   Lymphs Abs 06/07/2022 3.1  0.7 - 4.0 K/uL Final   Monocytes Relative 06/07/2022 4  % Final   Monocytes Absolute 06/07/2022 0.2  0.1 - 1.0 K/uL Final   Eosinophils Relative 06/07/2022 0  % Final   Eosinophils Absolute 06/07/2022 0.0  0.0 - 0.5 K/uL Final   Basophils Relative 06/07/2022 0  % Final   Basophils Absolute 06/07/2022 0.0  0.0 - 0.1 K/uL Final   Immature Granulocytes 06/07/2022 0  % Final   Abs Immature Granulocytes 06/07/2022 0.01  0.00 - 0.07 K/uL Final   Performed at [facility], [address], [place], [place] [address]   Sodium 06/07/2022 139  135 - 145 mmol/L Final   Potassium 06/07/2022 4.0  3.5 - 5.1 mmol/L Final   Chloride 06/07/2022 104  98 - 111 mmol/L Final   CO2 06/07/2022 28  22 - 32 mmol/L Final   Glucose, Bld 06/07/2022 104 (H)  70 - 99 mg/dL Final   Glucose reference range applies only to samples taken after fasting for at least 8 hours.   BUN 06/07/2022 8  6 - 20 mg/dL Final   Creatinine, Ser 06/07/2022 0.84  0.44 - 1.00 mg/dL Final   Calcium 38/07/1750 9.1  8.9 - 10.3 mg/dL Final   Total Protein 02/58/5277 6.9  6.5 - 8.1 g/dL Final   Albumin 82/42/3536 3.7  3.5 - 5.0 g/dL Final   AST 14/43/1540 19  15 - 41 U/L Final   ALT 06/07/2022 22  0 - 44 U/L Final   Alkaline Phosphatase 06/07/2022 54  38 - 126 U/L Final   Total Bilirubin 06/07/2022 0.4  0.3 - 1.2 mg/dL Final   GFR, Estimated 06/07/2022 >60  >60 mL/min Final   Comment: (NOTE) Calculated using the CKD-EPI Creatinine Equation (2021)    Anion gap 06/07/2022 7  5 - 15 Final   Performed at [facility], [address], [place], [place] [address]   Hgb A1c MFr Bld 06/07/2022 5.0  4.8 - 5.6 % Final   Comment: (NOTE) Pre diabetes:           5.7%-6.4%  Diabetes:              >6.4%  Glycemic control for   <7.0% adults with diabetes    Mean Plasma Glucose 06/07/2022 96.8  mg/dL Final   Performed at [facility], [address], [place], [place] [address]   TSH 06/07/2022 1.620  0.350 - 4.500 uIU/mL Final   Comment: Performed by a 3rd Generation assay with a functional sensitivity of <=0.01 uIU/mL. Performed at [facility], [address] [place], [place] [address]    RPR Ser Ql 06/07/2022 NON REACTIVE  NON REACTIVE Final   Performed at [facility], [address], [place], [place] [address]   Color, Urine 06/07/2022 YELLOW  YELLOW Final   APPearance 06/07/2022 HAZY (A)  CLEAR Final   Specific Gravity, Urine 06/07/2022 1.018  1.005 - 1.030 Final   pH 06/07/2022 7.0  5.0 - 8.0 Final   Glucose, UA 06/07/2022 NEGATIVE  NEGATIVE mg/dL Final   Hgb urine dipstick 06/07/2022 NEGATIVE  NEGATIVE Final   Bilirubin Urine 06/07/2022 NEGATIVE  NEGATIVE Final   Ketones, ur 06/07/2022 NEGATIVE  NEGATIVE mg/dL Final   Protein, ur 32/20/2542 NEGATIVE  NEGATIVE mg/dL Final   Nitrite 70/62/3762 NEGATIVE  NEGATIVE Final   Leukocytes,Ua 06/07/2022 NEGATIVE  NEGATIVE Final   Performed at [facility], [address] [place], [place] [address]   Cholesterol 06/07/2022 164  0 - 200 mg/dL Final   Triglycerides 76/16/0737 158 (H)  <150 mg/dL Final   HDL 10/62/6948 44  >40 mg/dL Final   Total CHOL/HDL Ratio 06/07/2022 3.7  RATIO Final   VLDL 06/07/2022 32  0 - 40 mg/dL Final   LDL Cholesterol 06/07/2022 88  0 - 99 mg/dL Final   Comment:        Total Cholesterol/HDL:CHD Risk Coronary Heart Disease Risk Table                     Men   Women  1/2 Average Risk   3.4   3.3  Average Risk       5.0   4.4  2 X Average Risk   9.6   7.1  3 X Average Risk  23.4   11.0        Use the calculated Patient Ratio above and the CHD Risk Table to determine the patient's CHD Risk.        ATP III CLASSIFICATION (LDL):  <100      mg/dL   Optimal  546-270  mg/dL   Near or Above                    Optimal  130-159  mg/dL   Borderline  350-093  mg/dL   High  >818     mg/dL   Very High Performed at [facility],  [address] [place], [place] [address]    HIV Screen 4th Generation wRfx 06/07/2022 Non Reactive  Non Reactive Final   Performed at [facility], [place] [address], [place], [place] [address]   SARSCOV2ONAVIRUS 2 AG 06/07/2022 NEGATIVE  NEGATIVE Final   Comment: (NOTE) SARS-CoV-2 antigen NOT DETECTED.   Negative results are presumptive.  Negative results do not preclude SARS-CoV-2 infection and should not be used as the sole basis for treatment or other patient management decisions, including infection  control decisions, particularly in the presence of clinical signs and  symptoms consistent with COVID-19, or in those who have been in contact with the virus.  Negative results must be combined with clinical observations, patient history, and epidemiological information. The expected result is Negative.  Fact Sheet for Patients: HandmadeRecipes.com.cy  Fact Sheet for Healthcare Providers: FuneralLife.at  This test is not yet approved or cleared by the [place] FDA and  has been authorized for detection and/or diagnosis of SARS-CoV-2 by FDA under an Emergency Use Authorization (EUA).  This EUA will remain in effect (meaning this test can be used) for the duration of  the COV                          ID-19 declaration under Section 564(b)(1) of the Act, 21 U.S.C. section 360bbb-3(b)(1), unless the authorization is terminated or revoked sooner.     POC Amphetamine UR 06/07/2022 None Detected  NONE DETECTED (Cut Off Level 1000 ng/mL) Final   POC Secobarbital (BAR) 06/07/2022 None Detected  NONE DETECTED (Cut Off Level 300 ng/mL) Final   POC Buprenorphine (BUP) 06/07/2022 None Detected  NONE DETECTED (Cut Off Level 10 ng/mL) Final   POC  Oxazepam (BZO) 06/07/2022 None Detected  NONE DETECTED (Cut Off Level 300 ng/mL) Final   POC Cocaine UR 06/07/2022 None Detected  NONE DETECTED (Cut Off Level 300 ng/mL) Final   POC Methamphetamine UR 06/07/2022 None Detected  NONE DETECTED (Cut Off Level 1000 ng/mL) Final   POC Morphine 06/07/2022 None Detected  NONE DETECTED (Cut Off Level 300 ng/mL) Final   POC Methadone UR 06/07/2022 None Detected  NONE DETECTED (Cut Off Level 300 ng/mL) Final   POC Oxycodone UR 06/07/2022 None Detected  NONE DETECTED (Cut Off Level 100 ng/mL) Final   POC Marijuana UR 06/07/2022 None Detected  NONE DETECTED (Cut Off Level 50 ng/mL) Final   Preg Test, Ur 06/08/2022 NEGATIVE  NEGATIVE Final   Comment:        THE SENSITIVITY OF THIS METHODOLOGY IS >20 mIU/mL. Performed at [facility], [place] [address], [place], [place] [address]    Preg Test, Ur 06/08/2022 NEGATIVE  NEGATIVE Final   Comment:        THE SENSITIVITY OF THIS METHODOLOGY IS >24 mIU/mL     Blood Alcohol level:  Lab Results  Component Value Date   ETH <10 07/04/2022   ETH <10 [id number]    Metabolic Disorder Labs: Lab Results  Component Value Date   HGBA1C 5.0 06/07/2022   MPG 96.8 06/07/2022   MPG 96.8 02/07/2022   No results found for: "PROLACTIN" Lab Results  Component Value Date   CHOL 181 07/25/2022   TRIG 118 07/25/2022   HDL 45 07/25/2022   CHOLHDL 4.0 07/25/2022   VLDL 24 07/25/2022   LDLCALC 112 (H) 07/25/2022   LDLCALC 88 06/07/2022    Therapeutic Lab Levels: No results found for: "LITHIUM"  No results found for: "VALPROATE" No results found for: "CBMZ"  Physical Findings   GAD-7    Flowsheet Row [facility]  Total GAD-7 Score 17      PHQ2-9    Flowsheet Row [facility]  PHQ-2 Total Score 1 2  PHQ-9 Total Score 2 8       Flowsheet Row [facility]   C-SSRS RISK CATEGORY High Risk No Risk No Risk        Musculoskeletal  Strength & Muscle Tone: within normal limits Gait & Station: normal Patient leans: N/A  Psychiatric Specialty Exam  Presentation  General Appearance:  Appropriate for Environment  Eye Contact: Good  Speech: Clear and Coherent; Normal Rate  Speech Volume: Normal  Handedness: Right   Mood and Affect  Mood: Euthymic  Affect: Congruent   Thought Process  Thought Processes: Coherent; Goal Directed  Descriptions of Associations:Intact  Orientation:Full (Time, Place and Person)  Thought Content:Logical  Diagnosis of Schizophrenia or Schizoaffective disorder in past: No    Hallucinations:Hallucinations: None  Ideas of Reference:None  Suicidal Thoughts:Suicidal Thoughts: No  Homicidal Thoughts:Homicidal Thoughts: No   Sensorium  Memory: Immediate Good; Recent Good  Judgment: Intact  Insight: Present   Executive Functions  Concentration: Fair  Attention Span: Fair  Recall: [employer]  Fund of Knowledge: Fair  Language: Fair   Psychomotor Activity  Psychomotor Activity: Psychomotor Activity: Normal   Assets  Assets: Communication Skills; Desire for Improvement   Sleep  Sleep: Sleep: Good   Nutritional Assessment (For OBS and [facility] admissions only) Has the patient had a weight loss or gain of 10 pounds or more in the last 3 months?: No Has the patient had a decrease in food intake/or appetite?: No Does the patient have dental problems?: No Does the patient have eating habits or behaviors that may be indicators of an eating disorder including binging or inducing vomiting?: No Has the patient recently lost weight without trying?: 0 Has the patient been  eating poorly because of a decreased appetite?: 0 Malnutrition Screening Tool Score: 0   Physical Exam  Physical Exam Constitutional:      General: She is not in acute distress.    Appearance: She is not ill-appearing, toxic-appearing or diaphoretic.  Eyes:     General: No scleral icterus. Cardiovascular:     Rate and Rhythm: Normal rate.  Pulmonary:     Effort: Pulmonary effort is normal. No respiratory distress.  Neurological:     Mental Status: She is alert and oriented to person, place, and time.  Psychiatric:        Attention and Perception: Attention and perception normal.        Mood and Affect: Mood and affect normal.        Speech: Speech normal.        Behavior: Behavior normal.        Thought Content: Thought content normal.    Review of Systems  Constitutional:  Negative for chills and fever.  Respiratory:  Negative for shortness of breath.   Cardiovascular:  Negative for chest pain and palpitations.  Gastrointestinal:  Negative for abdominal pain.  Neurological:  Negative for headaches.  Psychiatric/Behavioral:  Depression:  Stable. Hallucinations: Denies. Suicidal ideas: Denies. Nervous/anxious: Stable.    Blood pressure 100/67, pulse 97, temperature 98 F (36.7 C), temperature source Oral, resp. rate 16, SpO2 100 %. There is no height or weight on file to calculate BMI.  Treatment Plan Summary: Plan Pt remains psychiatrically cleared. Disposition is pending placement. Social work to continue working with DSS for appropriate placement  [employer], NP 08/28/2022 9:29 AM

## 2022-08-28 NOTE — ED Notes (Signed)
Pt. is out talking with, Rod[facility].

## 2022-08-28 NOTE — ED Notes (Signed)
Pt taken to 134 to speak with Child psychotherapist.

## 2022-08-29 DIAGNOSIS — F333 Major depressive disorder, recurrent, severe with psychotic symptoms: Secondary | ICD-10-CM | POA: Diagnosis not present

## 2022-08-29 LAB — GLUCOSE, CAPILLARY: Glucose-Capillary: 100 mg/dL — ABNORMAL HIGH (ref 70–99)

## 2022-08-29 NOTE — ED Notes (Addendum)
Pt complained of tingling and numbness in legs and feet. Pt mood is solemn. Pt took medication without incident.

## 2022-08-29 NOTE — ED Notes (Signed)
Pt is in the bed sleeping. Respirations are even and unlabored. No acute distress noted. Will continue to monitor for safety. 

## 2022-08-29 NOTE — ED Provider Notes (Signed)
Behavioral Health Progress Note  Date and Time: 08/29/2022 1:58 PM Name: Nicole [name] MRN:  [id number]  Subjective:   Nicole [name] is a 31 y.o. female, with PMH of SCZ unspecified, MDD with psychosis, who presented voluntary to [facility] (07/25/2022) via [facility] after a verbal altercation with Nicole [name] (not patient's legal guardian) for transient SI.  This is patient's fourth visit to [facility] for similar concerns this year. Patient has been dismissed from previous group home due to threatening SI and HI, then leaving the group home.   Legal Guardian: Nicole [name]) transitioning to be Nicole [name]) Point of contact: Nicole [name])  Nicole [name], 31 y.o., female patient seen face to face by this provider, consulted with Dr. Nelly Rout; and chart reviewed on 08/29/22.  On evaluation Nicole [name] reports she is eating and sleeping without difficulty.  She denies suicidal/self-harm/homicidal ideation, psychosis, and paranoia.   During evaluation Nicole [name] is sitting up in bed with no noted distress.  She is alert, oriented x 4, calm, cooperative and attentive.  Her mood is euthymic with congruent affect.  She has normal speech, and behavior.  Objectively there is no evidence of psychosis/mania or delusional thinking.  Patient is able to converse coherently, goal directed thoughts, no distractibility, or pre-occupation.  She also denies suicidal/self-harm/homicidal ideation, psychosis, and paranoia.  Patient answered all questions appropriately.  Diagnosis:  Final diagnoses:  Severe episode of recurrent major depressive disorder, with psychotic features (HCC)    Total Time spent with patient: 15 minutes  Past Psychiatric History: SCZ unspecified, MDD with psychosis, Anxiety Past Medical History:  Past Medical History:  Diagnosis Date   Anxiety    Depression    Hypothyroidism 08/07/2022   Tobacco use disorder 08/07/2022    Past Surgical History:  Procedure Laterality  Date   WISDOM TOOTH EXTRACTION Bilateral 2020   Family History:  Family History  Problem Relation Age of Onset   Hypertension Father    Diabetes Father    Family Psychiatric  History: None reported Social History:  Social History   Substance and Sexual Activity  Alcohol Use Never     Social History   Substance and Sexual Activity  Drug Use Never    Social History   Socioeconomic History   Marital status: Single    Spouse name: Not on file   Number of children: Not on file   Years of education: Not on file   Highest education level: Not on file  Occupational History   Not on file  Tobacco Use   Smoking status: Every Day    Types: Cigarettes   Smokeless tobacco: Never  Vaping Use   Vaping Use: Some days  Substance and Sexual Activity   Alcohol use: Never   Drug use: Never   Sexual activity: Yes    Partners: Female    Birth control/protection: Implant  Other Topics Concern   Not on file  Social History Narrative   Not on file   Social Determinants of Health   Financial Resource Strain: Not on file  Food Insecurity: Not on file  Transportation Needs: Not on file  Physical Activity: Not on file  Stress: Not on file  Social Connections: Not on file   SDOH:  SDOH Screenings   Depression (PHQ2-9): Low Risk  (06/10/2022)  Tobacco Use: High Risk (08/26/2022)   Additional Social History:    Pain Medications: SEE MAR Prescriptions: SEE MAR Over the Counter: SEE MAR History of alcohol / drug use?:  Yes Longest period of sobriety (when/how long): N/A Negative Consequences of Use:  (NONE) Withdrawal Symptoms: None Name of Substance 1: ETOH IN PAST--PT CURRENTLY DENIES 1 - Frequency: SOCIAL USE ETOH IN PAST 1 - Method of Aquiring: LEGAL 1- Route of Use: ORAL DRINK Name of Substance 2: NICOTINE-VAPE AND CIGARETTES 2 - Amount (size/oz): LESS THAN ONE PACK 2 - Frequency: DAILY 2 - Method of Aquiring: LEGAL 2 - Route of Substance Use: ORAL SMOKE                 Sleep: Good  Appetite:  Good  Current Medications:  Current Facility-Administered Medications  Medication Dose Route Frequency Provider Last Rate Last Admin   acetaminophen (TYLENOL) tablet 650 mg  650 mg Oral Q6H PRN Onuoha, Chinwendu V, NP   650 mg at 08/29/22 1023   alum & mag hydroxide-simeth (MAALOX/MYLANTA) 200-200-20 MG/5ML suspension 30 mL  30 mL Oral Q4H PRN Onuoha, Chinwendu V, NP   30 mL at 07/27/22 2151   ARIPiprazole ER (ABILIFY MAINTENA) injection 400 mg  400 mg Intramuscular Q28 days Princess Bruins, DO   400 mg at 08/02/22 1154   clotrimazole (LOTRIMIN) 1 % cream   Topical BID Bobbitt, Shalon E, NP   1 Application at 08/28/22 2130   hydrOXYzine (ATARAX) tablet 25 mg  25 mg Oral TID PRN Onuoha, Chinwendu V, NP   25 mg at 08/27/22 2233   ibuprofen (ADVIL) tablet 400 mg  400 mg Oral Q6H PRN Princess Bruins, DO   400 mg at 08/22/22 2027   levothyroxine (SYNTHROID) tablet 100 mcg  100 mcg Oral Q0600 Princess Bruins, DO   100 mcg at 08/28/22 4132   ziprasidone (GEODON) injection 20 mg  20 mg Intramuscular Q12H PRN Onuoha, Chinwendu V, NP       And   LORazepam (ATIVAN) tablet 1 mg  1 mg Oral PRN Onuoha, Chinwendu V, NP       magnesium hydroxide (MILK OF MAGNESIA) suspension 30 mL  30 mL Oral Daily PRN Onuoha, Chinwendu V, NP   30 mL at 08/22/22 1005   nicotine (NICODERM CQ - dosed in mg/24 hours) patch 14 mg  14 mg Transdermal Q0600 Onuoha, Chinwendu V, NP   14 mg at 08/29/22 1012   ondansetron (ZOFRAN-ODT) disintegrating tablet 4 mg  4 mg Oral Q8H PRN Sindy Guadeloupe, NP   4 mg at 08/29/22 0012   Oxcarbazepine (TRILEPTAL) tablet 300 mg  300 mg Oral BID Onuoha, Chinwendu V, NP   300 mg at 08/29/22 1013   pantoprazole (PROTONIX) EC tablet 20 mg  20 mg Oral Daily Princess Bruins, DO   20 mg at 08/29/22 1012   QUEtiapine (SEROQUEL) tablet 400 mg  400 mg Oral BID Onuoha, Chinwendu V, NP   400 mg at 08/29/22 1012   senna-docusate (Senokot-S) tablet 1 tablet  1 tablet Oral QHS Princess Bruins, DO   1 tablet at 08/28/22 2129   sertraline (ZOLOFT) tablet 150 mg  150 mg Oral Daily Onuoha, Chinwendu V, NP   150 mg at 08/29/22 1013   traZODone (DESYREL) tablet 50 mg  50 mg Oral QHS PRN Onuoha, Chinwendu V, NP   50 mg at 08/28/22 2136   valACYclovir (VALTREX) tablet 500 mg  500 mg Oral Daily Princess Bruins, DO   500 mg at 08/29/22 1012   Current Outpatient Medications  Medication Sig Dispense Refill   ABILIFY MAINTENA 400 MG PRSY prefilled syringe 400 mg every 28 (twenty-eight) days.  cetirizine (ZYRTEC) 10 MG tablet Take 10 mg by mouth daily.     cyclobenzaprine (FLEXERIL) 10 MG tablet Take 1 tablet (10 mg total) by mouth 2 (two) times daily as needed for muscle spasms. 20 tablet 0   fluticasone (FLONASE) 50 MCG/ACT nasal spray Place 1 spray into both nostrils daily.     meloxicam (MOBIC) 15 MG tablet Take 15 mg by mouth daily.     Oxcarbazepine (TRILEPTAL) 300 MG tablet Take 1 tablet (300 mg total) by mouth 2 (two) times daily. 60 tablet 0   QUEtiapine (SEROQUEL) 400 MG tablet Take 1 tablet (400 mg total) by mouth 2 (two) times daily. 60 tablet 0   sertraline (ZOLOFT) 50 MG tablet Take 3 tablets (150 mg total) by mouth in the morning. 90 tablet 0   traZODone (DESYREL) 100 MG tablet Take 1 tablet (100 mg total) by mouth at bedtime. 30 tablet 0   valACYclovir (VALTREX) 500 MG tablet Take 500 mg by mouth daily.      Labs  Lab Results:  Admission on 07/25/2022  Component Date Value Ref Range Status   SARS Coronavirus 2 by RT PCR 07/25/2022 NEGATIVE  NEGATIVE Final   Comment: (NOTE) SARS-CoV-2 target nucleic acids are NOT DETECTED.  The SARS-CoV-2 RNA is generally detectable in upper respiratory specimens during the acute phase of infection. The lowest concentration of SARS-CoV-2 viral copies this assay can detect is 138 copies/mL. A negative result does not preclude SARS-Cov-2 infection and should not be used as the sole basis for treatment or other patient management  decisions. A negative result may occur with  improper specimen collection/handling, submission of specimen other than nasopharyngeal swab, presence of viral mutation(s) within the areas targeted by this assay, and inadequate number of viral copies(<138 copies/mL). A negative result must be combined with clinical observations, patient history, and epidemiological information. The expected result is Negative.  Fact Sheet for Patients:  BloggerCourse.com  Fact Sheet for Healthcare Providers:  SeriousBroker.it  This test is no                          t yet approved or cleared by the [place] FDA and  has been authorized for detection and/or diagnosis of SARS-CoV-2 by FDA under an Emergency Use Authorization (EUA). This EUA will remain  in effect (meaning this test can be used) for the duration of the COVID-19 declaration under Section 564(b)(1) of the Act, 21 U.S.C.section 360bbb-3(b)(1), unless the authorization is terminated  or revoked sooner.       Influenza A by PCR 07/25/2022 NEGATIVE  NEGATIVE Final   Influenza B by PCR 07/25/2022 NEGATIVE  NEGATIVE Final   Comment: (NOTE) The Xpert Xpress SARS-CoV-2/FLU/RSV plus assay is intended as an aid in the diagnosis of influenza from Nasopharyngeal swab specimens and should not be used as a sole basis for treatment. Nasal washings and aspirates are unacceptable for Xpert Xpress SARS-CoV-2/FLU/RSV testing.  Fact Sheet for Patients: BloggerCourse.com  Fact Sheet for Healthcare Providers: SeriousBroker.it  This test is not yet approved or cleared by the [place] FDA and has been authorized for detection and/or diagnosis of SARS-CoV-2 by FDA under an Emergency Use Authorization (EUA). This EUA will remain in effect (meaning this test can be used) for the duration of the COVID-19 declaration under Section 564(b)(1) of the Act,  21 U.S.C. section 360bbb-3(b)(1), unless the authorization is terminated or revoked.  Performed at [facility], [address] [place], [place] [address]    WBC 07/25/2022 8.3  4.0 - 10.5 K/uL Final   RBC 07/25/2022 4.44  3.87 - 5.11 MIL/uL Final   Hemoglobin 07/25/2022 13.7  12.0 - 15.0 g/dL Final   HCT 60/45/409810/09/2022 40.2  36.0 - 46.0 % Final   MCV 07/25/2022 90.5  80.0 - 100.0 fL Final   MCH 07/25/2022 30.9  26.0 - 34.0 pg Final   MCHC 07/25/2022 34.1  30.0 - 36.0 g/dL Final   RDW 11/91/478210/09/2022 12.2  11.5 - 15.5 % Final   Platelets 07/25/2022 248  150 - 400 K/uL Final   nRBC 07/25/2022 0.0  0.0 - 0.2 % Final   Neutrophils Relative % 07/25/2022 43  % Final   Neutro Abs 07/25/2022 3.6  1.7 - 7.7 K/uL Final   Lymphocytes Relative 07/25/2022 52  % Final   Lymphs Abs 07/25/2022 4.2 (H)  0.7 - 4.0 K/uL Final   Monocytes Relative 07/25/2022 5  % Final   Monocytes Absolute 07/25/2022 0.4  0.1 - 1.0 K/uL Final   Eosinophils Relative 07/25/2022 0  % Final   Eosinophils Absolute 07/25/2022 0.0  0.0 - 0.5 K/uL Final   Basophils Relative 07/25/2022 0  % Final   Basophils Absolute 07/25/2022 0.0  0.0 - 0.1 K/uL Final   Immature Granulocytes 07/25/2022 0  % Final   Abs Immature Granulocytes 07/25/2022 0.02  0.00 - 0.07 K/uL Final   Performed at [facility], [address] [place], [place] [address]   Sodium 07/25/2022 138  135 - 145 mmol/L Final   Potassium 07/25/2022 4.0  3.5 - 5.1 mmol/L Final   Chloride 07/25/2022 104  98 - 111 mmol/L Final   CO2 07/25/2022 29  22 - 32 mmol/L Final   Glucose, Bld 07/25/2022 83  70 - 99 mg/dL Final   Glucose reference range applies only to samples taken after fasting for at least 8 hours.   BUN 07/25/2022 11  6 - 20 mg/dL Final   Creatinine, Ser 07/25/2022 0.97  0.44 - 1.00 mg/dL Final   Calcium 30/86/578410/09/2022 9.2  8.9 - 10.3 mg/dL Final   Total Protein 69/62/952810/09/2022 7.0  6.5 - 8.1 g/dL Final   Albumin 41/32/440110/09/2022 3.8  3.5 - 5.0 g/dL Final   AST  02/72/536610/09/2022 18  15 - 41 U/L Final   ALT 07/25/2022 22  0 - 44 U/L Final   Alkaline Phosphatase 07/25/2022 64  38 - 126 U/L Final   Total Bilirubin 07/25/2022 0.2 (L)  0.3 - 1.2 mg/dL Final   GFR, Estimated 07/25/2022 >60  >60 mL/min Final   Comment: (NOTE) Calculated using the CKD-EPI Creatinine Equation (2021)    Anion gap 07/25/2022 5  5 - 15 Final   Performed at [facility], [address] [place], [place] [address]   POC Amphetamine UR 07/25/2022 None Detected  NONE DETECTED (Cut Off Level 1000 ng/mL) Preliminary   POC Secobarbital (BAR) 07/25/2022 None Detected  NONE DETECTED (Cut Off Level 300 ng/mL) Preliminary   POC Buprenorphine (BUP) 07/25/2022 None Detected  NONE DETECTED (Cut Off Level 10 ng/mL) Preliminary   POC Oxazepam (BZO) 07/25/2022 None Detected  NONE DETECTED (Cut Off Level 300 ng/mL) Preliminary   POC Cocaine UR 07/25/2022 None Detected  NONE DETECTED (Cut Off Level 300 ng/mL) Preliminary   POC Methamphetamine UR 07/25/2022 None Detected  NONE DETECTED (Cut Off Level 1000 ng/mL) Preliminary   POC Morphine 07/25/2022 None Detected  NONE DETECTED (Cut Off Level  300 ng/mL) Preliminary   POC Methadone UR 07/25/2022 None Detected  NONE DETECTED (Cut Off Level 300 ng/mL) Preliminary   POC Oxycodone UR 07/25/2022 None Detected  NONE DETECTED (Cut Off Level 100 ng/mL) Preliminary   POC Marijuana UR 07/25/2022 None Detected  NONE DETECTED (Cut Off Level 50 ng/mL) Preliminary   SARSCOV2ONAVIRUS 2 AG 07/25/2022 NEGATIVE  NEGATIVE Final   Comment: (NOTE) SARS-CoV-2 antigen NOT DETECTED.   Negative results are presumptive.  Negative results do not preclude SARS-CoV-2 infection and should not be used as the sole basis for treatment or other patient management decisions, including infection  control decisions, particularly in the presence of clinical signs and  symptoms consistent with COVID-19, or in those who have been in contact with the virus.  Negative results must be  combined with clinical observations, patient history, and epidemiological information. The expected result is Negative.  Fact Sheet for Patients: https://www.jennings-kim.com/  Fact Sheet for Healthcare Providers: https://alexander-rogers.biz/  This test is not yet approved or cleared by the [place] FDA and  has been authorized for detection and/or diagnosis of SARS-CoV-2 by FDA under an Emergency Use Authorization (EUA).  This EUA will remain in effect (meaning this test can be used) for the duration of  the COV                          ID-19 declaration under Section 564(b)(1) of the Act, 21 U.S.C. section 360bbb-3(b)(1), unless the authorization is terminated or revoked sooner.     Cholesterol 07/25/2022 181  0 - 200 mg/dL Final   Triglycerides 16/07/9603 118  <150 mg/dL Final   HDL 54/06/8118 45  >40 mg/dL Final   Total CHOL/HDL Ratio 07/25/2022 4.0  RATIO Final   VLDL 07/25/2022 24  0 - 40 mg/dL Final   LDL Cholesterol 07/25/2022 112 (H)  0 - 99 mg/dL Final   Comment:        Total Cholesterol/HDL:CHD Risk Coronary Heart Disease Risk Table                     Men   Women  1/2 Average Risk   3.4   3.3  Average Risk       5.0   4.4  2 X Average Risk   9.6   7.1  3 X Average Risk  23.4   11.0        Use the calculated Patient Ratio above and the CHD Risk Table to determine the patient's CHD Risk.        ATP III CLASSIFICATION (LDL):  <100     mg/dL   Optimal  147-829  mg/dL   Near or Above                    Optimal  130-159  mg/dL   Borderline  562-130  mg/dL   High  >865     mg/dL   Very High Performed at [facility], [address] [place], [place] [address]    TSH 07/25/2022 6.668 (H)  0.350 - 4.500 uIU/mL Final   Comment: Performed by a 3rd Generation assay with a functional sensitivity of <=0.01 uIU/mL. Performed at [facility], [address] [place], [place] [address]    Glucose-Capillary 07/26/2022 104 (H)  70  - 99 mg/dL Final   Glucose reference range applies only to samples taken after fasting for at least 8 hours.  T3, Free 07/31/2022 2.3  2.0 - 4.4 pg/mL Final   Comment: (NOTE) Performed At: [facility] [address] [place], [place] [id number] Jolene Schimke MD QV:[id number]    Free T4 07/31/2022 0.60 (L)  0.61 - 1.12 ng/dL Final   Comment: (NOTE) Biotin ingestion may interfere with free T4 tests. If the results are inconsistent with the TSH level, previous test results, or the clinical presentation, then consider biotin interference. If needed, order repeat testing after stopping biotin. Performed at [facility], [address], [place], [place] [address]   Admission on 07/04/2022, Discharged on 07/05/2022  Component Date Value Ref Range Status   Sodium 07/04/2022 142  135 - 145 mmol/L Final   Potassium 07/04/2022 4.4  3.5 - 5.1 mmol/L Final   Chloride 07/04/2022 109  98 - 111 mmol/L Final   CO2 07/04/2022 26  22 - 32 mmol/L Final   Glucose, Bld 07/04/2022 96  70 - 99 mg/dL Final   Glucose reference range applies only to samples taken after fasting for at least 8 hours.   BUN 07/04/2022 15  6 - 20 mg/dL Final   Creatinine, Ser 07/04/2022 0.83  0.44 - 1.00 mg/dL Final   Calcium 88/41/6606 9.3  8.9 - 10.3 mg/dL Final   Total Protein 30/16/0109 7.2  6.5 - 8.1 g/dL Final   Albumin 32/35/5732 3.9  3.5 - 5.0 g/dL Final   AST 20/25/4270 23  15 - 41 U/L Final   ALT 07/04/2022 28  0 - 44 U/L Final   Alkaline Phosphatase 07/04/2022 77  38 - 126 U/L Final   Total Bilirubin 07/04/2022 0.4  0.3 - 1.2 mg/dL Final   GFR, Estimated 07/04/2022 >60  >60 mL/min Final   Comment: (NOTE) Calculated using the CKD-EPI Creatinine Equation (2021)    Anion gap 07/04/2022 7  5 - 15 Final   Performed at [facility], [address], [place], [place] [address]   Alcohol, Ethyl (B) 07/04/2022 <10  <10 mg/dL Final   Comment: (NOTE) Lowest detectable limit for serum  alcohol is 10 mg/dL.  For medical purposes only. Performed at [facility], [address] [place], [place] [address]    WBC 07/04/2022 7.0  4.0 - 10.5 K/uL Final   RBC 07/04/2022 4.18  3.87 - 5.11 MIL/uL Final   Hemoglobin 07/04/2022 12.8  12.0 - 15.0 g/dL Final   HCT 17/61/6073 39.1  36.0 - 46.0 % Final   MCV 07/04/2022 93.5  80.0 - 100.0 fL Final   MCH 07/04/2022 30.6  26.0 - 34.0 pg Final   MCHC 07/04/2022 32.7  30.0 - 36.0 g/dL Final   RDW 71/03/2693 12.9  11.5 - 15.5 % Final   Platelets 07/04/2022 243  150 - 400 K/uL Final   nRBC 07/04/2022 0.0  0.0 - 0.2 % Final   Neutrophils Relative % 07/04/2022 43  % Final   Neutro Abs 07/04/2022 3.0  1.7 - 7.7 K/uL Final   Lymphocytes Relative 07/04/2022 50  % Final   Lymphs Abs 07/04/2022 3.5  0.7 - 4.0 K/uL Final   Monocytes Relative 07/04/2022 7  % Final   Monocytes Absolute 07/04/2022 0.5  0.1 - 1.0 K/uL Final   Eosinophils Relative 07/04/2022 0  % Final   Eosinophils Absolute 07/04/2022 0.0  0.0 - 0.5 K/uL Final   Basophils Relative 07/04/2022 0  % Final   Basophils Absolute 07/04/2022 0.0  0.0 - 0.1 K/uL Final   Immature Granulocytes 07/04/2022  0  % Final   Abs Immature Granulocytes 07/04/2022 0.01  0.00 - 0.07 K/uL Final   Performed at [facility], [address] [place], [place] [address]   I-stat hCG, quantitative 07/04/2022 <5.0  <5 mIU/mL Final   Comment 3 07/04/2022          Final   Comment:   GEST. AGE      CONC.  (mIU/mL)   <=1 WEEK        5 - 50     2 WEEKS       50 - 500     3 WEEKS       100 - 10,000     4 WEEKS     1,000 - 30,000        FEMALE AND NON-PREGNANT FEMALE:     LESS THAN 5 mIU/mL   Admission on 06/14/2022, Discharged on 06/14/2022  Component Date Value Ref Range Status   Color, UA 06/14/2022 yellow  yellow Final   Clarity, UA 06/14/2022 cloudy (A)  clear Final   Glucose, UA 06/14/2022 negative  negative mg/dL Final   Bilirubin, UA 60/45/4098 negative  negative  Final   Ketones, POC UA 06/14/2022 negative  negative mg/dL Final   Spec Grav, UA 11/91/4782 1.025  1.010 - 1.025 Final   Blood, UA 06/14/2022 negative  negative Final   pH, UA 06/14/2022 7.5  5.0 - 8.0 Final   Protein Ur, POC 06/14/2022 =30 (A)  negative mg/dL Final   Urobilinogen, UA 06/14/2022 0.2  0.2 or 1.0 E.U./dL Final   Nitrite, UA 95/62/1308 Negative  Negative Final   Leukocytes, UA 06/14/2022 Negative  Negative Final   Preg Test, Ur 06/14/2022 Negative  Negative Final   Specimen Description 06/14/2022 URINE, CLEAN CATCH   Final   Special Requests 06/14/2022    Final                   Value:NONE Performed at [facility], [address], [place], [place] [address]    Culture 06/14/2022 MULTIPLE SPECIES PRESENT, SUGGEST RECOLLECTION (A)   Final   Report Status 06/14/2022 06/16/2022 FINAL   Final  Admission on 06/07/2022, Discharged on 06/10/2022  Component Date Value Ref Range Status   SARS Coronavirus 2 by RT PCR 06/07/2022 NEGATIVE  NEGATIVE Final   Comment: (NOTE) SARS-CoV-2 target nucleic acids are NOT DETECTED.  The SARS-CoV-2 RNA is generally detectable in upper respiratory specimens during the acute phase of infection. The lowest concentration of SARS-CoV-2 viral copies this assay can detect is 138 copies/mL. A negative result does not preclude SARS-Cov-2 infection and should not be used as the sole basis for treatment or other patient management decisions. A negative result may occur with  improper specimen collection/handling, submission of specimen other than nasopharyngeal swab, presence of viral mutation(s) within the areas targeted by this assay, and inadequate number of viral copies(<138 copies/mL). A negative result must be combined with clinical observations, patient history, and epidemiological information. The expected result is Negative.  Fact Sheet for Patients:  BloggerCourse.com  Fact Sheet for Healthcare Providers:   SeriousBroker.it  This test is no                          t yet approved or cleared by the [place] FDA and  has been authorized for detection and/or diagnosis of SARS-CoV-2 by FDA under an Emergency Use Authorization (EUA). This EUA will remain  in effect (meaning this test can be used) for the duration of the COVID-19 declaration under Section 564(b)(1) of the Act, 21 U.S.C.section 360bbb-3(b)(1), unless the authorization is terminated  or revoked sooner.       Influenza A by PCR 06/07/2022 NEGATIVE  NEGATIVE Final   Influenza B by PCR 06/07/2022 NEGATIVE  NEGATIVE Final   Comment: (NOTE) The Xpert Xpress SARS-CoV-2/FLU/RSV plus assay is intended as an aid in the diagnosis of influenza from Nasopharyngeal swab specimens and should not be used as a sole basis for treatment. Nasal washings and aspirates are unacceptable for Xpert Xpress SARS-CoV-2/FLU/RSV testing.  Fact Sheet for Patients: BloggerCourse.com  Fact Sheet for Healthcare Providers: SeriousBroker.it  This test is not yet approved or cleared by the [place] FDA and has been authorized for detection and/or diagnosis of SARS-CoV-2 by FDA under an Emergency Use Authorization (EUA). This EUA will remain in effect (meaning this test can be used) for the duration of the COVID-19 declaration under Section 564(b)(1) of the Act, 21 U.S.C. section 360bbb-3(b)(1), unless the authorization is terminated or revoked.  Performed at [facility], [address], [place], [place] [address]    WBC 06/07/2022 5.7  4.0 - 10.5 K/uL Final   RBC 06/07/2022 4.39  3.87 - 5.11 MIL/uL Final   Hemoglobin 06/07/2022 13.2  12.0 - 15.0 g/dL Final   HCT 60/45/4098 40.4  36.0 - 46.0 % Final   MCV 06/07/2022 92.0  80.0 - 100.0 fL Final   MCH 06/07/2022 30.1  26.0 - 34.0 pg Final   MCHC 06/07/2022 32.7  30.0 - 36.0 g/dL Final   RDW 11/91/4782 12.4  11.5  - 15.5 % Final   Platelets 06/07/2022 308  150 - 400 K/uL Final   nRBC 06/07/2022 0.0  0.0 - 0.2 % Final   Neutrophils Relative % 06/07/2022 42  % Final   Neutro Abs 06/07/2022 2.4  1.7 - 7.7 K/uL Final   Lymphocytes Relative 06/07/2022 54  % Final   Lymphs Abs 06/07/2022 3.1  0.7 - 4.0 K/uL Final   Monocytes Relative 06/07/2022 4  % Final   Monocytes Absolute 06/07/2022 0.2  0.1 - 1.0 K/uL Final   Eosinophils Relative 06/07/2022 0  % Final   Eosinophils Absolute 06/07/2022 0.0  0.0 - 0.5 K/uL Final   Basophils Relative 06/07/2022 0  % Final   Basophils Absolute 06/07/2022 0.0  0.0 - 0.1 K/uL Final   Immature Granulocytes 06/07/2022 0  % Final   Abs Immature Granulocytes 06/07/2022 0.01  0.00 - 0.07 K/uL Final   Performed at [facility], [address], [place], [place] [address]   Sodium 06/07/2022 139  135 - 145 mmol/L Final   Potassium 06/07/2022 4.0  3.5 - 5.1 mmol/L Final   Chloride 06/07/2022 104  98 - 111 mmol/L Final   CO2 06/07/2022 28  22 - 32 mmol/L Final   Glucose, Bld 06/07/2022 104 (H)  70 - 99 mg/dL Final   Glucose reference range applies only to samples taken after fasting for at least 8 hours.   BUN 06/07/2022 8  6 - 20 mg/dL Final   Creatinine, Ser 06/07/2022 0.84  0.44 - 1.00 mg/dL Final   Calcium 30/86/5784 9.1  8.9 - 10.3 mg/dL Final   Total Protein 69/62/9528 6.9  6.5 - 8.1 g/dL Final   Albumin 41/32/4401 3.7  3.5 - 5.0 g/dL Final   AST 02/72/5366 19  15 - 41 U/L Final   ALT  06/07/2022 22  0 - 44 U/L Final   Alkaline Phosphatase 06/07/2022 54  38 - 126 U/L Final   Total Bilirubin 06/07/2022 0.4  0.3 - 1.2 mg/dL Final   GFR, Estimated 06/07/2022 >60  >60 mL/min Final   Comment: (NOTE) Calculated using the CKD-EPI Creatinine Equation (2021)    Anion gap 06/07/2022 7  5 - 15 Final   Performed at [facility], [address] [place], [place] [address]   Hgb A1c MFr Bld 06/07/2022 5.0  4.8 - 5.6 % Final   Comment: (NOTE) Pre diabetes:           5.7%-6.4%  Diabetes:              >6.4%  Glycemic control for   <7.0% adults with diabetes    Mean Plasma Glucose 06/07/2022 96.8  mg/dL Final   Performed at [facility], [address], [place], [place] [address]   TSH 06/07/2022 1.620  0.350 - 4.500 uIU/mL Final   Comment: Performed by a 3rd Generation assay with a functional sensitivity of <=0.01 uIU/mL. Performed at [facility], [address], [place], [place] [address]    RPR Ser Ql 06/07/2022 NON REACTIVE  NON REACTIVE Final   Performed at [facility], [address], [place], [place] [address]   Color, Urine 06/07/2022 YELLOW  YELLOW Final   APPearance 06/07/2022 HAZY (A)  CLEAR Final   Specific Gravity, Urine 06/07/2022 1.018  1.005 - 1.030 Final   pH 06/07/2022 7.0  5.0 - 8.0 Final   Glucose, UA 06/07/2022 NEGATIVE  NEGATIVE mg/dL Final   Hgb urine dipstick 06/07/2022 NEGATIVE  NEGATIVE Final   Bilirubin Urine 06/07/2022 NEGATIVE  NEGATIVE Final   Ketones, ur 06/07/2022 NEGATIVE  NEGATIVE mg/dL Final   Protein, ur 84/69/6295 NEGATIVE  NEGATIVE mg/dL Final   Nitrite 28/41/3244 NEGATIVE  NEGATIVE Final   Leukocytes,Ua 06/07/2022 NEGATIVE  NEGATIVE Final   Performed at [facility], [address], [place], [place] [address]   Cholesterol 06/07/2022 164  0 - 200 mg/dL Final   Triglycerides 25/36/6440 158 (H)  <150 mg/dL Final   HDL 34/74/2595 44  >40 mg/dL Final   Total CHOL/HDL Ratio 06/07/2022 3.7  RATIO Final   VLDL 06/07/2022 32  0 - 40 mg/dL Final   LDL Cholesterol 06/07/2022 88  0 - 99 mg/dL Final   Comment:        Total Cholesterol/HDL:CHD Risk Coronary Heart Disease Risk Table                     Men   Women  1/2 Average Risk   3.4   3.3  Average Risk       5.0   4.4  2 X Average Risk   9.6   7.1  3 X Average Risk  23.4   11.0        Use the calculated Patient Ratio above and the CHD Risk Table to determine the patient's CHD Risk.        ATP III CLASSIFICATION (LDL):  <100      mg/dL   Optimal  638-756  mg/dL   Near or Above                    Optimal  130-159  mg/dL   Borderline  433-295  mg/dL   High  >188     mg/dL   Very High  Performed at [facility], [address], [place], [place] [address]    HIV Screen 4th Generation wRfx 06/07/2022 Non Reactive  Non Reactive Final   Performed at [facility], [address], [place], [place] [address]   SARSCOV2ONAVIRUS 2 AG 06/07/2022 NEGATIVE  NEGATIVE Final   Comment: (NOTE) SARS-CoV-2 antigen NOT DETECTED.   Negative results are presumptive.  Negative results do not preclude SARS-CoV-2 infection and should not be used as the sole basis for treatment or other patient management decisions, including infection  control decisions, particularly in the presence of clinical signs and  symptoms consistent with COVID-19, or in those who have been in contact with the virus.  Negative results must be combined with clinical observations, patient history, and epidemiological information. The expected result is Negative.  Fact Sheet for Patients: https://www.jennings-kim.com/  Fact Sheet for Healthcare Providers: https://alexander-rogers.biz/  This test is not yet approved or cleared by the [place] FDA and  has been authorized for detection and/or diagnosis of SARS-CoV-2 by FDA under an Emergency Use Authorization (EUA).  This EUA will remain in effect (meaning this test can be used) for the duration of  the COV                          ID-19 declaration under Section 564(b)(1) of the Act, 21 U.S.C. section 360bbb-3(b)(1), unless the authorization is terminated or revoked sooner.     POC Amphetamine UR 06/07/2022 None Detected  NONE DETECTED (Cut Off Level 1000 ng/mL) Final   POC Secobarbital (BAR) 06/07/2022 None Detected  NONE DETECTED (Cut Off Level 300 ng/mL) Final   POC Buprenorphine (BUP) 06/07/2022 None Detected  NONE DETECTED (Cut Off Level 10 ng/mL) Final   POC  Oxazepam (BZO) 06/07/2022 None Detected  NONE DETECTED (Cut Off Level 300 ng/mL) Final   POC Cocaine UR 06/07/2022 None Detected  NONE DETECTED (Cut Off Level 300 ng/mL) Final   POC Methamphetamine UR 06/07/2022 None Detected  NONE DETECTED (Cut Off Level 1000 ng/mL) Final   POC Morphine 06/07/2022 None Detected  NONE DETECTED (Cut Off Level 300 ng/mL) Final   POC Methadone UR 06/07/2022 None Detected  NONE DETECTED (Cut Off Level 300 ng/mL) Final   POC Oxycodone UR 06/07/2022 None Detected  NONE DETECTED (Cut Off Level 100 ng/mL) Final   POC Marijuana UR 06/07/2022 None Detected  NONE DETECTED (Cut Off Level 50 ng/mL) Final   Preg Test, Ur 06/08/2022 NEGATIVE  NEGATIVE Final   Comment:        THE SENSITIVITY OF THIS METHODOLOGY IS >20 mIU/mL. Performed at [facility], [address] [place], [place] [address]    Preg Test, Ur 06/08/2022 NEGATIVE  NEGATIVE Final   Comment:        THE SENSITIVITY OF THIS METHODOLOGY IS >24 mIU/mL     Blood Alcohol level:  Lab Results  Component Value Date   ETH <10 07/04/2022   ETH <10 02/07/2022    Metabolic Disorder Labs: Lab Results  Component Value Date   HGBA1C 5.0 06/07/2022   MPG 96.8 06/07/2022   MPG 96.8 02/07/2022   No results found for: "PROLACTIN" Lab Results  Component Value Date   CHOL 181 07/25/2022   TRIG 118 07/25/2022   HDL 45 07/25/2022   CHOLHDL 4.0 07/25/2022   VLDL 24 07/25/2022   LDLCALC 112 (H) 07/25/2022   LDLCALC 88 06/07/2022    Therapeutic Lab Levels: No results  found for: "LITHIUM" No results found for: "VALPROATE" No results found for: "CBMZ"  Physical Findings   GAD-7    Flowsheet Row Office Visit [facility]/17/2021 [facility]  Total GAD-7 Score 17      PHQ2-9    Flowsheet Row ED from 06/07/2022 in [facility] Office Visit from 12/17/2021 [facility]  PHQ-2 Total Score 1 2  PHQ-9 Total Score 2 8       Flowsheet Row ED from 07/25/2022 in [facility] ED from 07/04/2022 in [facility] DEPT ED from 06/14/2022 in [facility]   C-SSRS RISK CATEGORY High Risk No Risk No Risk        Musculoskeletal  Strength & Muscle Tone: within normal limits Gait & Station: normal Patient leans: N/A  Psychiatric Specialty Exam  Presentation  General Appearance:  Appropriate for Environment  Eye Contact: Good  Speech: Clear and Coherent; Normal Rate  Speech Volume: Normal  Handedness: Right   Mood and Affect  Mood: Euthymic  Affect: Congruent   Thought Process  Thought Processes: Coherent; Goal Directed  Descriptions of Associations:Intact  Orientation:Full (Time, Place and Person)  Thought Content:Logical  Diagnosis of Schizophrenia or Schizoaffective disorder in past: No    Hallucinations:Hallucinations: None  Ideas of Reference:None  Suicidal Thoughts:Suicidal Thoughts: No  Homicidal Thoughts:Homicidal Thoughts: No   Sensorium  Memory: Immediate Good; Recent Good  Judgment: Intact  Insight: Present   Executive Functions  Concentration: Fair  Attention Span: Fair  Recall: [employer] of Knowledge: Fair  Language: Fair   Psychomotor Activity  Psychomotor Activity: Psychomotor Activity: Normal   Assets  Assets: Communication Skills; Desire for Improvement   Sleep  Sleep: Sleep: Good   Nutritional Assessment (For OBS and [facility] admissions only) Has the patient had a weight loss or gain of 10 pounds or more in the last 3 months?: No Has the patient had a decrease in food intake/or appetite?: No Does the patient have dental problems?: No Does the patient have eating habits or behaviors that may be indicators of an eating disorder including binging or inducing vomiting?: No Has the patient recently lost weight without trying?: 0 Has the patient been  eating poorly because of a decreased appetite?: 0 Malnutrition Screening Tool Score: 0   Physical Exam  Physical Exam Constitutional:      General: She is not in acute distress.    Appearance: She is not ill-appearing, toxic-appearing or diaphoretic.  Eyes:     General: No scleral icterus. Pulmonary:     Effort: Pulmonary effort is normal. No respiratory distress.  Neurological:     Mental Status: She is alert and oriented to person, place, and time.  Psychiatric:        Attention and Perception: Attention and perception normal.        Mood and Affect: Mood and affect normal.        Speech: Speech normal.        Behavior: Behavior normal.        Thought Content: Thought content normal.    Review of Systems  Constitutional:  Negative for chills and fever.  Respiratory:  Negative for shortness of breath.   Cardiovascular:  Negative for chest pain and palpitations.  Gastrointestinal:  Negative for abdominal pain.  Neurological:  Negative for headaches.  Psychiatric/Behavioral:  Depression: Stable. Hallucinations: Denies. Suicidal ideas: Denies. Nervous/anxious: Stable.  Blood pressure (!) 108/92, pulse (!) 122, temperature 98.7 F (37.1 C), temperature source Oral, resp. rate 16, SpO2 100 %. There is no height or weight on file to calculate BMI.  Treatment Plan Summary: Plan Pt remains psychiatrically cleared. Disposition is pending placement. Social work to continue working with DSS for appropriate placement  [name], MD 08/29/2022 1:58 PM

## 2022-08-29 NOTE — ED Notes (Signed)
Pt is sleeping. No distress noted. Will continue to monitor safety. 

## 2022-08-29 NOTE — ED Notes (Signed)
Patient denies SI/HI and AVH.Patient is eating snacks, calm and cooperative. Patient is being monitored for safety.

## 2022-08-30 NOTE — ED Notes (Signed)
Pt has been complaining of various symptoms all day. She had a headache, has been nauseous and pain in her stomach. Dr. Alfonse Flavors was made aware earlier with no new orders

## 2022-08-30 NOTE — ED Notes (Signed)
Patient denies SI, HI and AVH - will continue to monitor for safety 

## 2022-08-30 NOTE — ED Notes (Signed)
Snack given.

## 2022-08-30 NOTE — ED Provider Notes (Signed)
Behavioral Health Progress Note  Date and Time: 08/30/2022 6:30 PM Name: Nicole [name] MRN:  [id number]  Subjective:   Nicole [name] is a 31 y.o. female, with PMH of SCZ unspecified, MDD with psychosis, who presented voluntary to [facility] (07/25/2022) via [facility] after a verbal altercation with [name] (not patient's legal guardian) for transient SI.  This is patient's fourth visit to [facility] for similar concerns this year. Patient has been dismissed from previous group home due to threatening SI and HI, then leaving the group home.   Legal Guardian: [name]  [name], 57 y.o., female patient seen face to face by this provider, consulted with Dr. Nelly Rout; and chart reviewed on 08/30/22.  On evaluation Nicole [name] reports she is eating and sleeping without difficulty.  She denies suicidal/self-harm/homicidal ideation, psychosis, and paranoia.   During evaluation Nicole [name] is sitting up in bed with no noted distress.  She is alert, oriented x 4, calm, cooperative and attentive.  Her mood is euthymic with congruent affect.  She has normal speech, and behavior.  Objectively there is no evidence of psychosis/mania or delusional thinking.  Patient is able to converse coherently, goal directed thoughts, no distractibility, or pre-occupation.  She also denies suicidal/self-harm/homicidal ideation, psychosis, and paranoia.  Patient answered all questions appropriately.  Meeting with caseworker was rescheduled. Patient inquired about when this meeting would be and she was informed that she would know when we did. Patient complained of nausea, that tends to worsen at night and diffuse body aches. Her vitals have been stable. We discuss methods of stretching to help mitigate some of her pain and she is agreeable. As well, she is advised to remove her nicotine patch earlier in the evening. Patient requests a  bible and one is provided to her.   Diagnosis:  Final diagnoses:  Severe episode of recurrent major depressive disorder, with psychotic features (HCC)    Total Time spent with patient: 15 minutes  Past Psychiatric History: SCZ unspecified, MDD with psychosis, Anxiety Past Medical History:  Past Medical History:  Diagnosis Date   Anxiety    Depression    Hypothyroidism 08/07/2022   Tobacco use disorder 08/07/2022    Past Surgical History:  Procedure Laterality Date   WISDOM TOOTH EXTRACTION Bilateral 2020   Family History:  Family History  Problem Relation Age of Onset   Hypertension Father    Diabetes Father    Family Psychiatric  History: None reported Social History:  Social History   Substance and Sexual Activity  Alcohol Use Never     Social History   Substance and Sexual Activity  Drug Use Never    Social History   Socioeconomic History   Marital status: Single    Spouse name: Not on file   Number of children: Not on file   Years of education: Not on file   Highest education level: Not on file  Occupational History   Not on file  Tobacco Use   Smoking status: Every Day    Types: Cigarettes   Smokeless tobacco: Never  Vaping Use   Vaping Use: Some days  Substance and Sexual Activity   Alcohol use: Never   Drug use: Never   Sexual activity: Yes    Partners: Female    Birth control/protection: Implant  Other Topics Concern   Not on file  Social History Narrative   Not on file   Social Determinants  of Health   Financial Resource Strain: Not on file  Food Insecurity: Not on file  Transportation Needs: Not on file  Physical Activity: Not on file  Stress: Not on file  Social Connections: Not on file   SDOH:  SDOH Screenings   Depression (PHQ2-9): Low Risk  (06/10/2022)  Tobacco Use: High Risk (08/26/2022)   Additional Social History:    Pain Medications: SEE MAR Prescriptions: SEE MAR Over the Counter: SEE MAR History of alcohol /  drug use?: Yes Longest period of sobriety (when/how long): N/A Negative Consequences of Use:  (NONE) Withdrawal Symptoms: None Name of Substance 1: ETOH IN PAST--PT CURRENTLY DENIES 1 - Frequency: SOCIAL USE ETOH IN PAST 1 - Method of Aquiring: LEGAL 1- Route of Use: ORAL DRINK Name of Substance 2: NICOTINE-VAPE AND CIGARETTES 2 - Amount (size/oz): LESS THAN ONE PACK 2 - Frequency: DAILY 2 - Method of Aquiring: LEGAL 2 - Route of Substance Use: ORAL SMOKE                Sleep: Good  Appetite:  Good  Current Medications:  Current Facility-Administered Medications  Medication Dose Route Frequency Provider Last Rate Last Admin   acetaminophen (TYLENOL) tablet 650 mg  650 mg Oral Q6H PRN Onuoha, Chinwendu V, NP   650 mg at 08/29/22 1630   alum & mag hydroxide-simeth (MAALOX/MYLANTA) 200-200-20 MG/5ML suspension 30 mL  30 mL Oral Q4H PRN Onuoha, Chinwendu V, NP   30 mL at 07/27/22 2151   ARIPiprazole ER (ABILIFY MAINTENA) injection 400 mg  400 mg Intramuscular Q28 days Princess Bruins, DO   400 mg at 08/30/22 1338   clotrimazole (LOTRIMIN) 1 % cream   Topical BID Bobbitt, Shalon E, NP   1 Application at 08/28/22 2130   hydrOXYzine (ATARAX) tablet 25 mg  25 mg Oral TID PRN Onuoha, Chinwendu V, NP   25 mg at 08/27/22 2233   ibuprofen (ADVIL) tablet 400 mg  400 mg Oral Q6H PRN Princess Bruins, DO   400 mg at 08/30/22 4098   levothyroxine (SYNTHROID) tablet 100 mcg  100 mcg Oral Q0600 Princess Bruins, DO   100 mcg at 08/30/22 0618   ziprasidone (GEODON) injection 20 mg  20 mg Intramuscular Q12H PRN Onuoha, Chinwendu V, NP       And   LORazepam (ATIVAN) tablet 1 mg  1 mg Oral PRN Onuoha, Chinwendu V, NP       magnesium hydroxide (MILK OF MAGNESIA) suspension 30 mL  30 mL Oral Daily PRN Onuoha, Chinwendu V, NP   30 mL at 08/30/22 1819   nicotine (NICODERM CQ - dosed in mg/24 hours) patch 14 mg  14 mg Transdermal Q0600 Onuoha, Chinwendu V, NP   14 mg at 08/30/22 0919   ondansetron (ZOFRAN-ODT)  disintegrating tablet 4 mg  4 mg Oral Q8H PRN Sindy Guadeloupe, NP   4 mg at 08/30/22 1041   Oxcarbazepine (TRILEPTAL) tablet 300 mg  300 mg Oral BID Onuoha, Chinwendu V, NP   300 mg at 08/30/22 0918   pantoprazole (PROTONIX) EC tablet 20 mg  20 mg Oral Daily Princess Bruins, DO   20 mg at 08/30/22 0918   QUEtiapine (SEROQUEL) tablet 400 mg  400 mg Oral BID Onuoha, Chinwendu V, NP   400 mg at 08/30/22 1191   senna-docusate (Senokot-S) tablet 1 tablet  1 tablet Oral QHS Princess Bruins, DO   1 tablet at 08/29/22 2220   sertraline (ZOLOFT) tablet 150 mg  150 mg Oral Daily  Onuoha, Chinwendu V, NP   150 mg at 08/30/22 0918   traZODone (DESYREL) tablet 50 mg  50 mg Oral QHS PRN Onuoha, Chinwendu V, NP   50 mg at 08/28/22 2136   valACYclovir (VALTREX) tablet 500 mg  500 mg Oral Daily Princess Bruins, DO   500 mg at 08/30/22 1610   Current Outpatient Medications  Medication Sig Dispense Refill   ABILIFY MAINTENA 400 MG PRSY prefilled syringe 400 mg every 28 (twenty-eight) days.     cetirizine (ZYRTEC) 10 MG tablet Take 10 mg by mouth daily.     cyclobenzaprine (FLEXERIL) 10 MG tablet Take 1 tablet (10 mg total) by mouth 2 (two) times daily as needed for muscle spasms. 20 tablet 0   fluticasone (FLONASE) 50 MCG/ACT nasal spray Place 1 spray into both nostrils daily.     meloxicam (MOBIC) 15 MG tablet Take 15 mg by mouth daily.     Oxcarbazepine (TRILEPTAL) 300 MG tablet Take 1 tablet (300 mg total) by mouth 2 (two) times daily. 60 tablet 0   QUEtiapine (SEROQUEL) 400 MG tablet Take 1 tablet (400 mg total) by mouth 2 (two) times daily. 60 tablet 0   sertraline (ZOLOFT) 50 MG tablet Take 3 tablets (150 mg total) by mouth in the morning. 90 tablet 0   traZODone (DESYREL) 100 MG tablet Take 1 tablet (100 mg total) by mouth at bedtime. 30 tablet 0   valACYclovir (VALTREX) 500 MG tablet Take 500 mg by mouth daily.      Labs  Lab Results:  Admission on 07/25/2022  Component Date Value Ref Range Status   SARS  Coronavirus 2 by RT PCR 07/25/2022 NEGATIVE  NEGATIVE Final   Comment: (NOTE) SARS-CoV-2 target nucleic acids are NOT DETECTED.  The SARS-CoV-2 RNA is generally detectable in upper respiratory specimens during the acute phase of infection. The lowest concentration of SARS-CoV-2 viral copies this assay can detect is 138 copies/mL. A negative result does not preclude SARS-Cov-2 infection and should not be used as the sole basis for treatment or other patient management decisions. A negative result may occur with  improper specimen collection/handling, submission of specimen other than nasopharyngeal swab, presence of viral mutation(s) within the areas targeted by this assay, and inadequate number of viral copies(<138 copies/mL). A negative result must be combined with clinical observations, patient history, and epidemiological information. The expected result is Negative.  Fact Sheet for Patients:  BloggerCourse.com  Fact Sheet for Healthcare Providers:  SeriousBroker.it  This test is no                          t yet approved or cleared by the [place] FDA and  has been authorized for detection and/or diagnosis of SARS-CoV-2 by FDA under an Emergency Use Authorization (EUA). This EUA will remain  in effect (meaning this test can be used) for the duration of the COVID-19 declaration under Section 564(b)(1) of the Act, 21 U.S.C.section 360bbb-3(b)(1), unless the authorization is terminated  or revoked sooner.       Influenza A by PCR 07/25/2022 NEGATIVE  NEGATIVE Final   Influenza B by PCR 07/25/2022 NEGATIVE  NEGATIVE Final   Comment: (NOTE) The Xpert Xpress SARS-CoV-2/FLU/RSV plus assay is intended as an aid in the diagnosis of influenza from Nasopharyngeal swab specimens and should not be used as a sole basis for treatment. Nasal washings and aspirates are unacceptable for Xpert Xpress SARS-CoV-2/FLU/RSV testing.  Fact  Sheet for Patients:  BloggerCourse.com  Fact Sheet for Healthcare Providers: SeriousBroker.it  This test is not yet approved or cleared by the [place] FDA and has been authorized for detection and/or diagnosis of SARS-CoV-2 by FDA under an Emergency Use Authorization (EUA). This EUA will remain in effect (meaning this test can be used) for the duration of the COVID-19 declaration under Section 564(b)(1) of the Act, 21 U.S.C. section 360bbb-3(b)(1), unless the authorization is terminated or revoked.  Performed at [facility], [address], [place], [place] [address]    WBC 07/25/2022 8.3  4.0 - 10.5 K/uL Final   RBC 07/25/2022 4.44  3.87 - 5.11 MIL/uL Final   Hemoglobin 07/25/2022 13.7  12.0 - 15.0 g/dL Final   HCT 60/45/4098 40.2  36.0 - 46.0 % Final   MCV 07/25/2022 90.5  80.0 - 100.0 fL Final   MCH 07/25/2022 30.9  26.0 - 34.0 pg Final   MCHC 07/25/2022 34.1  30.0 - 36.0 g/dL Final   RDW 11/91/4782 12.2  11.5 - 15.5 % Final   Platelets 07/25/2022 248  150 - 400 K/uL Final   nRBC 07/25/2022 0.0  0.0 - 0.2 % Final   Neutrophils Relative % 07/25/2022 43  % Final   Neutro Abs 07/25/2022 3.6  1.7 - 7.7 K/uL Final   Lymphocytes Relative 07/25/2022 52  % Final   Lymphs Abs 07/25/2022 4.2 (H)  0.7 - 4.0 K/uL Final   Monocytes Relative 07/25/2022 5  % Final   Monocytes Absolute 07/25/2022 0.4  0.1 - 1.0 K/uL Final   Eosinophils Relative 07/25/2022 0  % Final   Eosinophils Absolute 07/25/2022 0.0  0.0 - 0.5 K/uL Final   Basophils Relative 07/25/2022 0  % Final   Basophils Absolute 07/25/2022 0.0  0.0 - 0.1 K/uL Final   Immature Granulocytes 07/25/2022 0  % Final   Abs Immature Granulocytes 07/25/2022 0.02  0.00 - 0.07 K/uL Final   Performed at [facility], [address] [place], [place] [address]   Sodium 07/25/2022 138  135 - 145 mmol/L Final   Potassium 07/25/2022 4.0  3.5 - 5.1 mmol/L Final   Chloride  07/25/2022 104  98 - 111 mmol/L Final   CO2 07/25/2022 29  22 - 32 mmol/L Final   Glucose, Bld 07/25/2022 83  70 - 99 mg/dL Final   Glucose reference range applies only to samples taken after fasting for at least 8 hours.   BUN 07/25/2022 11  6 - 20 mg/dL Final   Creatinine, Ser 07/25/2022 0.97  0.44 - 1.00 mg/dL Final   Calcium 30/86/5784 9.2  8.9 - 10.3 mg/dL Final   Total Protein 69/62/9528 7.0  6.5 - 8.1 g/dL Final   Albumin 41/32/4401 3.8  3.5 - 5.0 g/dL Final   AST 02/72/5366 18  15 - 41 U/L Final   ALT 07/25/2022 22  0 - 44 U/L Final   Alkaline Phosphatase 07/25/2022 64  38 - 126 U/L Final   Total Bilirubin 07/25/2022 0.2 (L)  0.3 - 1.2 mg/dL Final   GFR, Estimated 07/25/2022 >60  >60 mL/min Final   Comment: (NOTE) Calculated using the CKD-EPI Creatinine Equation (2021)    Anion gap 07/25/2022 5  5 - 15 Final   Performed at [facility], [address], [place], [place] [address]   POC Amphetamine UR 07/25/2022 None Detected  NONE DETECTED (Cut Off Level 1000 ng/mL) Preliminary   POC Secobarbital (BAR) 07/25/2022 None Detected  NONE DETECTED (Cut  Off Level 300 ng/mL) Preliminary   POC Buprenorphine (BUP) 07/25/2022 None Detected  NONE DETECTED (Cut Off Level 10 ng/mL) Preliminary   POC Oxazepam (BZO) 07/25/2022 None Detected  NONE DETECTED (Cut Off Level 300 ng/mL) Preliminary   POC Cocaine UR 07/25/2022 None Detected  NONE DETECTED (Cut Off Level 300 ng/mL) Preliminary   POC Methamphetamine UR 07/25/2022 None Detected  NONE DETECTED (Cut Off Level 1000 ng/mL) Preliminary   POC Morphine 07/25/2022 None Detected  NONE DETECTED (Cut Off Level 300 ng/mL) Preliminary   POC Methadone UR 07/25/2022 None Detected  NONE DETECTED (Cut Off Level 300 ng/mL) Preliminary   POC Oxycodone UR 07/25/2022 None Detected  NONE DETECTED (Cut Off Level 100 ng/mL) Preliminary   POC Marijuana UR 07/25/2022 None Detected  NONE DETECTED (Cut Off Level 50 ng/mL) Preliminary   SARSCOV2ONAVIRUS 2 AG  07/25/2022 NEGATIVE  NEGATIVE Final   Comment: (NOTE) SARS-CoV-2 antigen NOT DETECTED.   Negative results are presumptive.  Negative results do not preclude SARS-CoV-2 infection and should not be used as the sole basis for treatment or other patient management decisions, including infection  control decisions, particularly in the presence of clinical signs and  symptoms consistent with COVID-19, or in those who have been in contact with the virus.  Negative results must be combined with clinical observations, patient history, and epidemiological information. The expected result is Negative.  Fact Sheet for Patients: https://www.jennings-kim.com/  Fact Sheet for Healthcare Providers: https://alexander-rogers.biz/  This test is not yet approved or cleared by the [place] FDA and  has been authorized for detection and/or diagnosis of SARS-CoV-2 by FDA under an Emergency Use Authorization (EUA).  This EUA will remain in effect (meaning this test can be used) for the duration of  the COV                          ID-19 declaration under Section 564(b)(1) of the Act, 21 U.S.C. section 360bbb-3(b)(1), unless the authorization is terminated or revoked sooner.     Cholesterol 07/25/2022 181  0 - 200 mg/dL Final   Triglycerides 45/12/8880 118  <150 mg/dL Final   HDL 80/12/4915 45  >40 mg/dL Final   Total CHOL/HDL Ratio 07/25/2022 4.0  RATIO Final   VLDL 07/25/2022 24  0 - 40 mg/dL Final   LDL Cholesterol 07/25/2022 112 (H)  0 - 99 mg/dL Final   Comment:        Total Cholesterol/HDL:CHD Risk Coronary Heart Disease Risk Table                     Men   Women  1/2 Average Risk   3.4   3.3  Average Risk       5.0   4.4  2 X Average Risk   9.6   7.1  3 X Average Risk  23.4   11.0        Use the calculated Patient Ratio above and the CHD Risk Table to determine the patient's CHD Risk.        ATP III CLASSIFICATION (LDL):  <100     mg/dL   Optimal  915-056   mg/dL   Near or Above                    Optimal  130-159  mg/dL   Borderline  979-480  mg/dL   High  >165     mg/dL   Very High Performed  at [facility], [address], [place], [place] [address]    TSH 07/25/2022 6.668 (H)  0.350 - 4.500 uIU/mL Final   Comment: Performed by a 3rd Generation assay with a functional sensitivity of <=0.01 uIU/mL. Performed at [facility], [address] [place], [place] [address]    Glucose-Capillary 07/26/2022 104 (H)  70 - 99 mg/dL Final   Glucose reference range applies only to samples taken after fasting for at least 8 hours.   T3, Free 07/31/2022 2.3  2.0 - 4.4 pg/mL Final   Comment: (NOTE) Performed At: [facility] [address] [place], [place] [id number] Jolene Schimke MD WN:[id number]    Free T4 07/31/2022 0.60 (L)  0.61 - 1.12 ng/dL Final   Comment: (NOTE) Biotin ingestion may interfere with free T4 tests. If the results are inconsistent with the TSH level, previous test results, or the clinical presentation, then consider biotin interference. If needed, order repeat testing after stopping biotin. Performed at [facility], [address], [place], [place] [address]    Glucose-Capillary 08/29/2022 100 (H)  70 - 99 mg/dL Final   Glucose reference range applies only to samples taken after fasting for at least 8 hours.  Admission on 07/04/2022, Discharged on 07/05/2022  Component Date Value Ref Range Status   Sodium 07/04/2022 142  135 - 145 mmol/L Final   Potassium 07/04/2022 4.4  3.5 - 5.1 mmol/L Final   Chloride 07/04/2022 109  98 - 111 mmol/L Final   CO2 07/04/2022 26  22 - 32 mmol/L Final   Glucose, Bld 07/04/2022 96  70 - 99 mg/dL Final   Glucose reference range applies only to samples taken after fasting for at least 8 hours.   BUN 07/04/2022 15  6 - 20 mg/dL Final   Creatinine, Ser 07/04/2022 0.83  0.44 - 1.00 mg/dL Final   Calcium 25/95/6387 9.3  8.9 - 10.3 mg/dL Final   Total Protein 56/43/3295  7.2  6.5 - 8.1 g/dL Final   Albumin 18/84/1660 3.9  3.5 - 5.0 g/dL Final   AST 63/10/6008 23  15 - 41 U/L Final   ALT 07/04/2022 28  0 - 44 U/L Final   Alkaline Phosphatase 07/04/2022 77  38 - 126 U/L Final   Total Bilirubin 07/04/2022 0.4  0.3 - 1.2 mg/dL Final   GFR, Estimated 07/04/2022 >60  >60 mL/min Final   Comment: (NOTE) Calculated using the CKD-EPI Creatinine Equation (2021)    Anion gap 07/04/2022 7  5 - 15 Final   Performed at [facility], [address] [place], [place] [address]   Alcohol, Ethyl (B) 07/04/2022 <10  <10 mg/dL Final   Comment: (NOTE) Lowest detectable limit for serum alcohol is 10 mg/dL.  For medical purposes only. Performed at [facility], [address] [place], [place] [address]    WBC 07/04/2022 7.0  4.0 - 10.5 K/uL Final   RBC 07/04/2022 4.18  3.87 - 5.11 MIL/uL Final   Hemoglobin 07/04/2022 12.8  12.0 - 15.0 g/dL Final   HCT 02/54/2706 39.1  36.0 - 46.0 % Final   MCV 07/04/2022 93.5  80.0 - 100.0 fL Final   MCH 07/04/2022 30.6  26.0 - 34.0 pg Final   MCHC 07/04/2022 32.7  30.0 - 36.0 g/dL Final   RDW 23/76/2831 12.9  11.5 - 15.5 % Final   Platelets 07/04/2022 243  150 - 400 K/uL Final   nRBC 07/04/2022 0.0  0.0 -  0.2 % Final   Neutrophils Relative % 07/04/2022 43  % Final   Neutro Abs 07/04/2022 3.0  1.7 - 7.7 K/uL Final   Lymphocytes Relative 07/04/2022 50  % Final   Lymphs Abs 07/04/2022 3.5  0.7 - 4.0 K/uL Final   Monocytes Relative 07/04/2022 7  % Final   Monocytes Absolute 07/04/2022 0.5  0.1 - 1.0 K/uL Final   Eosinophils Relative 07/04/2022 0  % Final   Eosinophils Absolute 07/04/2022 0.0  0.0 - 0.5 K/uL Final   Basophils Relative 07/04/2022 0  % Final   Basophils Absolute 07/04/2022 0.0  0.0 - 0.1 K/uL Final   Immature Granulocytes 07/04/2022 0  % Final   Abs Immature Granulocytes 07/04/2022 0.01  0.00 - 0.07 K/uL Final   Performed at [facility], [address]  [place], [place] [address]   I-stat hCG, quantitative 07/04/2022 <5.0  <5 mIU/mL Final   Comment 3 07/04/2022          Final   Comment:   GEST. AGE      CONC.  (mIU/mL)   <=1 WEEK        5 - 50     2 WEEKS       50 - 500     3 WEEKS       100 - 10,000     4 WEEKS     1,000 - 30,000        FEMALE AND NON-PREGNANT FEMALE:     LESS THAN 5 mIU/mL   Admission on 06/14/2022, Discharged on 06/14/2022  Component Date Value Ref Range Status   Color, UA 06/14/2022 yellow  yellow Final   Clarity, UA 06/14/2022 cloudy (A)  clear Final   Glucose, UA 06/14/2022 negative  negative mg/dL Final   Bilirubin, UA 60/45/4098 negative  negative Final   Ketones, POC UA 06/14/2022 negative  negative mg/dL Final   Spec Grav, UA 11/91/4782 1.025  1.010 - 1.025 Final   Blood, UA 06/14/2022 negative  negative Final   pH, UA 06/14/2022 7.5  5.0 - 8.0 Final   Protein Ur, POC 06/14/2022 =30 (A)  negative mg/dL Final   Urobilinogen, UA 06/14/2022 0.2  0.2 or 1.0 E.U./dL Final   Nitrite, UA 95/62/1308 Negative  Negative Final   Leukocytes, UA 06/14/2022 Negative  Negative Final   Preg Test, Ur 06/14/2022 Negative  Negative Final   Specimen Description 06/14/2022 URINE, CLEAN CATCH   Final   Special Requests 06/14/2022    Final                   Value:NONE Performed at [facility], [address], [place], [place] [address]    Culture 06/14/2022 MULTIPLE SPECIES PRESENT, SUGGEST RECOLLECTION (A)   Final   Report Status 06/14/2022 06/16/2022 FINAL   Final  Admission on 06/07/2022, Discharged on 06/10/2022  Component Date Value Ref Range Status   SARS Coronavirus 2 by RT PCR 06/07/2022 NEGATIVE  NEGATIVE Final   Comment: (NOTE) SARS-CoV-2 target nucleic acids are NOT DETECTED.  The SARS-CoV-2 RNA is generally detectable in upper respiratory specimens during the acute phase of infection. The lowest concentration of SARS-CoV-2 viral copies this assay can detect is 138 copies/mL. A negative result does not  preclude SARS-Cov-2 infection and should not be used as the sole basis for treatment or other patient management decisions. A negative result may occur with  improper specimen collection/handling, submission of specimen other than nasopharyngeal swab, presence  of viral mutation(s) within the areas targeted by this assay, and inadequate number of viral copies(<138 copies/mL). A negative result must be combined with clinical observations, patient history, and epidemiological information. The expected result is Negative.  Fact Sheet for Patients:  BloggerCourse.com  Fact Sheet for Healthcare Providers:  SeriousBroker.it  This test is no                          t yet approved or cleared by the [place] FDA and  has been authorized for detection and/or diagnosis of SARS-CoV-2 by FDA under an Emergency Use Authorization (EUA). This EUA will remain  in effect (meaning this test can be used) for the duration of the COVID-19 declaration under Section 564(b)(1) of the Act, 21 U.S.C.section 360bbb-3(b)(1), unless the authorization is terminated  or revoked sooner.       Influenza A by PCR 06/07/2022 NEGATIVE  NEGATIVE Final   Influenza B by PCR 06/07/2022 NEGATIVE  NEGATIVE Final   Comment: (NOTE) The Xpert Xpress SARS-CoV-2/FLU/RSV plus assay is intended as an aid in the diagnosis of influenza from Nasopharyngeal swab specimens and should not be used as a sole basis for treatment. Nasal washings and aspirates are unacceptable for Xpert Xpress SARS-CoV-2/FLU/RSV testing.  Fact Sheet for Patients: BloggerCourse.com  Fact Sheet for Healthcare Providers: SeriousBroker.it  This test is not yet approved or cleared by the [place] FDA and has been authorized for detection and/or diagnosis of SARS-CoV-2 by FDA under an Emergency Use Authorization (EUA). This EUA will remain in  effect (meaning this test can be used) for the duration of the COVID-19 declaration under Section 564(b)(1) of the Act, 21 U.S.C. section 360bbb-3(b)(1), unless the authorization is terminated or revoked.  Performed at [facility], [address], [place], [place] [address]    WBC 06/07/2022 5.7  4.0 - 10.5 K/uL Final   RBC 06/07/2022 4.39  3.87 - 5.11 MIL/uL Final   Hemoglobin 06/07/2022 13.2  12.0 - 15.0 g/dL Final   HCT 60/45/4098 40.4  36.0 - 46.0 % Final   MCV 06/07/2022 92.0  80.0 - 100.0 fL Final   MCH 06/07/2022 30.1  26.0 - 34.0 pg Final   MCHC 06/07/2022 32.7  30.0 - 36.0 g/dL Final   RDW 11/91/4782 12.4  11.5 - 15.5 % Final   Platelets 06/07/2022 308  150 - 400 K/uL Final   nRBC 06/07/2022 0.0  0.0 - 0.2 % Final   Neutrophils Relative % 06/07/2022 42  % Final   Neutro Abs 06/07/2022 2.4  1.7 - 7.7 K/uL Final   Lymphocytes Relative 06/07/2022 54  % Final   Lymphs Abs 06/07/2022 3.1  0.7 - 4.0 K/uL Final   Monocytes Relative 06/07/2022 4  % Final   Monocytes Absolute 06/07/2022 0.2  0.1 - 1.0 K/uL Final   Eosinophils Relative 06/07/2022 0  % Final   Eosinophils Absolute 06/07/2022 0.0  0.0 - 0.5 K/uL Final   Basophils Relative 06/07/2022 0  % Final   Basophils Absolute 06/07/2022 0.0  0.0 - 0.1 K/uL Final   Immature Granulocytes 06/07/2022 0  % Final   Abs Immature Granulocytes 06/07/2022 0.01  0.00 - 0.07 K/uL Final   Performed at [facility], [address] [place], [place] [address]   Sodium 06/07/2022 139  135 - 145 mmol/L Final   Potassium 06/07/2022 4.0  3.5 - 5.1 mmol/L Final   Chloride 06/07/2022 104  98 -  111 mmol/L Final   CO2 06/07/2022 28  22 - 32 mmol/L Final   Glucose, Bld 06/07/2022 104 (H)  70 - 99 mg/dL Final   Glucose reference range applies only to samples taken after fasting for at least 8 hours.   BUN 06/07/2022 8  6 - 20 mg/dL Final   Creatinine, Ser 06/07/2022 0.84  0.44 - 1.00 mg/dL Final   Calcium 01/02/725308/25/2023 9.1  8.9 - 10.3 mg/dL  Final   Total Protein 06/07/2022 6.9  6.5 - 8.1 g/dL Final   Albumin 66/44/034708/25/2023 3.7  3.5 - 5.0 g/dL Final   AST 42/59/563808/25/2023 19  15 - 41 U/L Final   ALT 06/07/2022 22  0 - 44 U/L Final   Alkaline Phosphatase 06/07/2022 54  38 - 126 U/L Final   Total Bilirubin 06/07/2022 0.4  0.3 - 1.2 mg/dL Final   GFR, Estimated 06/07/2022 >60  >60 mL/min Final   Comment: (NOTE) Calculated using the CKD-EPI Creatinine Equation (2021)    Anion gap 06/07/2022 7  5 - 15 Final   Performed at [facility], [address] [place], [place] [address]   Hgb A1c MFr Bld 06/07/2022 5.0  4.8 - 5.6 % Final   Comment: (NOTE) Pre diabetes:          5.7%-6.4%  Diabetes:              >6.4%  Glycemic control for   <7.0% adults with diabetes    Mean Plasma Glucose 06/07/2022 96.8  mg/dL Final   Performed at [facility], [address] [place], [place] [address]   TSH 06/07/2022 1.620  0.350 - 4.500 uIU/mL Final   Comment: Performed by a 3rd Generation assay with a functional sensitivity of <=0.01 uIU/mL. Performed at [facility], [address] [place], [place] [address]    RPR Ser Ql 06/07/2022 NON REACTIVE  NON REACTIVE Final   Performed at [facility], [address] [place], [place] [address]   Color, Urine 06/07/2022 YELLOW  YELLOW Final   APPearance 06/07/2022 HAZY (A)  CLEAR Final   Specific Gravity, Urine 06/07/2022 1.018  1.005 - 1.030 Final   pH 06/07/2022 7.0  5.0 - 8.0 Final   Glucose, UA 06/07/2022 NEGATIVE  NEGATIVE mg/dL Final   Hgb urine dipstick 06/07/2022 NEGATIVE  NEGATIVE Final   Bilirubin Urine 06/07/2022 NEGATIVE  NEGATIVE Final   Ketones, ur 06/07/2022 NEGATIVE  NEGATIVE mg/dL Final   Protein, ur 16/01/093208/25/2023 NEGATIVE  NEGATIVE mg/dL Final   Nitrite 35/57/322008/25/2023 NEGATIVE  NEGATIVE Final   Leukocytes,Ua 06/07/2022 NEGATIVE  NEGATIVE Final   Performed at [facility], [address] [place], [place] [address]   Cholesterol 06/07/2022 164  0 - 200 mg/dL  Final   Triglycerides 06/07/2022 158 (H)  <150 mg/dL Final   HDL 06/23/762808/25/2023 44  >40 mg/dL Final   Total CHOL/HDL Ratio 06/07/2022 3.7  RATIO Final   VLDL 06/07/2022 32  0 - 40 mg/dL Final   LDL Cholesterol 06/07/2022 88  0 - 99 mg/dL Final   Comment:        Total Cholesterol/HDL:CHD Risk Coronary Heart Disease Risk Table                     Men   Women  1/2 Average Risk   3.4   3.3  Average Risk       5.0   4.4  2 X Average Risk  9.6   7.1  3 X Average Risk  23.4   11.0        Use the calculated Patient Ratio above and the CHD Risk Table to determine the patient's CHD Risk.        ATP III CLASSIFICATION (LDL):  <100     mg/dL   Optimal  505-697  mg/dL   Near or Above                    Optimal  130-159  mg/dL   Borderline  948-016  mg/dL   High  >553     mg/dL   Very High Performed at [facility], [address], [place], [place] [address]    HIV Screen 4th Generation wRfx 06/07/2022 Non Reactive  Non Reactive Final   Performed at [facility], [address], [place], [place] [address]   SARSCOV2ONAVIRUS 2 AG 06/07/2022 NEGATIVE  NEGATIVE Final   Comment: (NOTE) SARS-CoV-2 antigen NOT DETECTED.   Negative results are presumptive.  Negative results do not preclude SARS-CoV-2 infection and should not be used as the sole basis for treatment or other patient management decisions, including infection  control decisions, particularly in the presence of clinical signs and  symptoms consistent with COVID-19, or in those who have been in contact with the virus.  Negative results must be combined with clinical observations, patient history, and epidemiological information. The expected result is Negative.  Fact Sheet for Patients: https://www.jennings-kim.com/  Fact Sheet for Healthcare Providers: https://alexander-rogers.biz/  This test is not yet approved or cleared by the [place] FDA and  has been authorized for detection and/or  diagnosis of SARS-CoV-2 by FDA under an Emergency Use Authorization (EUA).  This EUA will remain in effect (meaning this test can be used) for the duration of  the COV                          ID-19 declaration under Section 564(b)(1) of the Act, 21 U.S.C. section 360bbb-3(b)(1), unless the authorization is terminated or revoked sooner.     POC Amphetamine UR 06/07/2022 None Detected  NONE DETECTED (Cut Off Level 1000 ng/mL) Final   POC Secobarbital (BAR) 06/07/2022 None Detected  NONE DETECTED (Cut Off Level 300 ng/mL) Final   POC Buprenorphine (BUP) 06/07/2022 None Detected  NONE DETECTED (Cut Off Level 10 ng/mL) Final   POC Oxazepam (BZO) 06/07/2022 None Detected  NONE DETECTED (Cut Off Level 300 ng/mL) Final   POC Cocaine UR 06/07/2022 None Detected  NONE DETECTED (Cut Off Level 300 ng/mL) Final   POC Methamphetamine UR 06/07/2022 None Detected  NONE DETECTED (Cut Off Level 1000 ng/mL) Final   POC Morphine 06/07/2022 None Detected  NONE DETECTED (Cut Off Level 300 ng/mL) Final   POC Methadone UR 06/07/2022 None Detected  NONE DETECTED (Cut Off Level 300 ng/mL) Final   POC Oxycodone UR 06/07/2022 None Detected  NONE DETECTED (Cut Off Level 100 ng/mL) Final   POC Marijuana UR 06/07/2022 None Detected  NONE DETECTED (Cut Off Level 50 ng/mL) Final   Preg Test, Ur 06/08/2022 NEGATIVE  NEGATIVE Final   Comment:        THE SENSITIVITY OF THIS METHODOLOGY IS >20 mIU/mL. Performed at [facility], [address], [place], [place] [address]    Preg Test, Ur 06/08/2022 NEGATIVE  NEGATIVE Final   Comment:        THE SENSITIVITY OF  THIS METHODOLOGY IS >24 mIU/mL     Blood Alcohol level:  Lab Results  Component Value Date   ETH <10 07/04/2022   ETH <10 02/07/2022    Metabolic Disorder Labs: Lab Results  Component Value Date   HGBA1C 5.0 06/07/2022   MPG 96.8 06/07/2022   MPG 96.8 02/07/2022   No results found for: "PROLACTIN" Lab Results  Component Value Date   CHOL 181  07/25/2022   TRIG 118 07/25/2022   HDL 45 07/25/2022   CHOLHDL 4.0 07/25/2022   VLDL 24 07/25/2022   LDLCALC 112 (H) 07/25/2022   LDLCALC 88 06/07/2022    Therapeutic Lab Levels: No results found for: "LITHIUM" No results found for: "VALPROATE" No results found for: "CBMZ"  Physical Findings   GAD-7    Flowsheet Row Office Visit from 12/17/2021 in [facility]  Total GAD-7 Score 17      PHQ2-9    Flowsheet Row ED from 06/07/2022 in [facility] Office Visit from 12/17/2021 in [facility]  PHQ-2 Total Score 1 2  PHQ-9 Total Score 2 8      Flowsheet Row ED from 07/25/2022 in [facility] ED from 07/04/2022 in [facility] from 06/14/2022 in [facility]   C-SSRS RISK CATEGORY High Risk No Risk No Risk        Musculoskeletal  Strength & Muscle Tone: within normal limits Gait & Station: normal Patient leans: N/A  Psychiatric Specialty Exam  Presentation  General Appearance:  Appropriate for Environment  Eye Contact: Good  Speech: Clear and Coherent; Normal Rate  Speech Volume: Normal  Handedness: Right   Mood and Affect  Mood: Euthymic  Affect: Congruent   Thought Process  Thought Processes: Coherent; Goal Directed  Descriptions of Associations:Intact  Orientation:Full (Time, Place and Person)  Thought Content:Logical  Diagnosis of Schizophrenia or Schizoaffective disorder in past: No    Hallucinations:Hallucinations: None   Ideas of Reference:None  Suicidal Thoughts:Suicidal Thoughts: No   Homicidal Thoughts:Homicidal Thoughts: No    Sensorium  Memory: Immediate Good; Recent Good  Judgment: Intact  Insight: Present   Executive Functions  Concentration: Fair  Attention Span: Fair  Recall: [employer] of  Knowledge: Fair  Language: Fair   Psychomotor Activity  Psychomotor Activity: Psychomotor Activity: Normal    Assets  Assets: Communication Skills; Desire for Improvement   Sleep  Sleep: Sleep: Good    No data recorded  Physical Exam  Physical Exam Constitutional:      General: She is not in acute distress.    Appearance: She is not ill-appearing, toxic-appearing or diaphoretic.  Eyes:     General: No scleral icterus. Pulmonary:     Effort: Pulmonary effort is normal. No respiratory distress.  Neurological:     Mental Status: She is alert and oriented to person, place, and time.  Psychiatric:        Attention and Perception: Attention and perception normal.        Mood and Affect: Mood and affect normal.        Speech: Speech normal.        Behavior: Behavior normal.        Thought Content: Thought content normal.    Review of Systems  Constitutional:  Negative for chills and fever.  Respiratory:  Negative for shortness of breath.   Cardiovascular:  Negative for chest pain and  palpitations.  Gastrointestinal:  Negative for abdominal pain.  Neurological:  Negative for headaches.  Psychiatric/Behavioral:  Depression: Stable. Hallucinations: Denies. Suicidal ideas: Denies. Nervous/anxious: Stable.    Blood pressure 96/63, pulse 89, temperature 98.3 F (36.8 C), temperature source Oral, resp. rate 19, SpO2 98 %. There is no height or weight on file to calculate BMI.  Treatment Plan Summary: Plan Pt remains psychiatrically cleared. Disposition is pending placement. Social work to continue working with DSS for appropriate placement  [name], MD 08/30/2022 6:30 PM

## 2022-08-30 NOTE — ED Notes (Signed)
Patient resting with no sxs of distress noted - will continue to monitor for safety 

## 2022-08-31 MED ORDER — MAGNESIUM CITRATE PO SOLN
1.0000 | Freq: Once | ORAL | Status: AC
  Administered 2022-09-01: 1 via ORAL
  Filled 2022-08-31: qty 296

## 2022-08-31 MED ORDER — POLYETHYLENE GLYCOL 3350 17 G PO PACK
17.0000 g | PACK | Freq: Every day | ORAL | Status: DC | PRN
  Administered 2022-08-31: 17 g via ORAL
  Filled 2022-08-31: qty 1

## 2022-08-31 NOTE — ED Notes (Signed)
Patient sleeping at this time. Breathing even and unlabored. No s/s of distress noted. Will continue to monitor for safety. 

## 2022-08-31 NOTE — ED Notes (Signed)
Pt continues to complain of constipation despite receiving Milk of Mag x2. She will receive miralax at bedtime if she has not had a bowel movement by then. Abdomen is firm to touch but she does not endorse any pain with palpation. Bowel sounds active in all 4 quadrants. Currently sleeping, no distress noted. Denies SI/HI/AVH. Pt is safe on unit.

## 2022-08-31 NOTE — ED Notes (Signed)
Patient sleeping - no sxs of distress noted - will continue to monitor for safety 

## 2022-08-31 NOTE — ED Notes (Signed)
Patient resting quietly in bed, calm and cooperative watching television. Respirations equal and unlabored, skin warm and dry, NAD. Routine safety checks conducted according to facility protocol. Will continue to monitor for safety.

## 2022-08-31 NOTE — ED Provider Notes (Signed)
Behavioral Health Progress Note  Date and Time: 08/31/2022 11:34 AM Name: Nicole [name] MRN:  [id number]  Subjective:  Nicole [name] is a 31 y.o. female, with PMH of SCZ unspecified, MDD with psychosis, who presented voluntary to [facility] (07/25/2022) via [facility] after a verbal altercation with [name] (not patient's legal guardian) for transient SI.  This is patient's fourth visit to [facility] for similar concerns this year. Patient has been dismissed from previous group home due to threatening SI and HI, then leaving the group home.   Legal Guardian: [name]  Upon reassessment, by nurse practitioner, patient is seated in observation area.  She is alert and oriented, pleasant and cooperative during assessment.  She presents with euthymic mood, congruent affect.    [name] endorses constipation with mild abdominal pain.  She reports most recent bowel movement approximately 4 days ago.  She has received 2 doses of milk of magnesia, 1 dose last evening 1 dose this morning.  Reviewed discontinuing milk of magnesia, initiating order for MiraLAX 17 g.  Patient verbalizes agreement with this plan.  She will ask for MiraLAX if no bowel movement by bedtime tonight.  Patient continues to deny suicidal and homicidal ideation.  She easily contracts verbally for safety with this Clinical research associate.  She denies auditory and visual hallucinations.  There is no evidence of the thought content and no indication of response to internal stimuli.  Tura is appropriately behaved while at [facility] behavioral health.  She is engaged with staff and peers.  She is compliant with medications including Abilify maintaina 400 mg IM, most recent dose on 08/30/2022.  Patient offered support and encouragement.  She verbalizes understanding of treatment plan to include awaiting disposition.   Diagnosis:  Final diagnoses:  Severe episode of  recurrent major depressive disorder, with psychotic features (HCC)    Total Time spent with patient: 20 minutes  Past Psychiatric History: above  Past Medical History:  Past Medical History:  Diagnosis Date   Anxiety    Depression    Hypothyroidism 08/07/2022   Tobacco use disorder 08/07/2022    Past Surgical History:  Procedure Laterality Date   WISDOM TOOTH EXTRACTION Bilateral 2020   Family History:  Family History  Problem Relation Age of Onset   Hypertension Father    Diabetes Father    Family Psychiatric  History: see above Social History:  Social History   Substance and Sexual Activity  Alcohol Use Never     Social History   Substance and Sexual Activity  Drug Use Never    Social History   Socioeconomic History   Marital status: Single    Spouse name: Not on file   Number of children: Not on file   Years of education: Not on file   Highest education level: Not on file  Occupational History   Not on file  Tobacco Use   Smoking status: Every Day    Types: Cigarettes   Smokeless tobacco: Never  Vaping Use   Vaping Use: Some days  Substance and Sexual Activity   Alcohol use: Never   Drug use: Never   Sexual activity: Yes    Partners: Female    Birth control/protection: Implant  Other Topics Concern   Not on file  Social History Narrative   Not on file   Social Determinants of Health   Financial Resource Strain: Not on file  Food Insecurity: Not on file  Transportation Needs: Not on file  Physical Activity: Not on file  Stress: Not on file  Social Connections: Not on file   SDOH:  SDOH Screenings   Depression (PHQ2-9): Low Risk  (06/10/2022)  Tobacco Use: High Risk (08/26/2022)   Additional Social History:    Pain Medications: SEE MAR Prescriptions: SEE MAR Over the Counter: SEE MAR History of alcohol / drug use?: Yes Longest period of sobriety (when/how long): N/A Negative Consequences of Use:  (NONE) Withdrawal Symptoms:  None Name of Substance 1: ETOH IN PAST--PT CURRENTLY DENIES 1 - Frequency: SOCIAL USE ETOH IN PAST 1 - Method of Aquiring: LEGAL 1- Route of Use: ORAL DRINK Name of Substance 2: NICOTINE-VAPE AND CIGARETTES 2 - Amount (size/oz): LESS THAN ONE PACK 2 - Frequency: DAILY 2 - Method of Aquiring: LEGAL 2 - Route of Substance Use: ORAL SMOKE                Sleep: Good  Appetite:  Fair  Current Medications:  Current Facility-Administered Medications  Medication Dose Route Frequency Provider Last Rate Last Admin   acetaminophen (TYLENOL) tablet 650 mg  650 mg Oral Q6H PRN Onuoha, Chinwendu V, NP   650 mg at 08/30/22 1856   alum & mag hydroxide-simeth (MAALOX/MYLANTA) 200-200-20 MG/5ML suspension 30 mL  30 mL Oral Q4H PRN Onuoha, Chinwendu V, NP   30 mL at 07/27/22 2151   ARIPiprazole ER (ABILIFY MAINTENA) injection 400 mg  400 mg Intramuscular Q28 days Princess Bruins, DO   400 mg at 08/30/22 1338   clotrimazole (LOTRIMIN) 1 % cream   Topical BID Bobbitt, Shalon E, NP   1 Application at 08/28/22 2130   hydrOXYzine (ATARAX) tablet 25 mg  25 mg Oral TID PRN Onuoha, Chinwendu V, NP   25 mg at 08/27/22 2233   ibuprofen (ADVIL) tablet 400 mg  400 mg Oral Q6H PRN Princess Bruins, DO   400 mg at 08/30/22 2123   levothyroxine (SYNTHROID) tablet 100 mcg  100 mcg Oral Q0600 Princess Bruins, DO   100 mcg at 08/31/22 0533   ziprasidone (GEODON) injection 20 mg  20 mg Intramuscular Q12H PRN Onuoha, Chinwendu V, NP       And   LORazepam (ATIVAN) tablet 1 mg  1 mg Oral PRN Onuoha, Chinwendu V, NP       magnesium hydroxide (MILK OF MAGNESIA) suspension 30 mL  30 mL Oral Daily PRN Onuoha, Chinwendu V, NP   30 mL at 08/31/22 1034   nicotine (NICODERM CQ - dosed in mg/24 hours) patch 14 mg  14 mg Transdermal Q0600 Onuoha, Chinwendu V, NP   14 mg at 08/31/22 1036   ondansetron (ZOFRAN-ODT) disintegrating tablet 4 mg  4 mg Oral Q8H PRN Sindy Guadeloupe, NP   4 mg at 08/30/22 2122   Oxcarbazepine (TRILEPTAL) tablet  300 mg  300 mg Oral BID Onuoha, Chinwendu V, NP   300 mg at 08/31/22 1037   pantoprazole (PROTONIX) EC tablet 20 mg  20 mg Oral Daily Princess Bruins, DO   20 mg at 08/31/22 1036   QUEtiapine (SEROQUEL) tablet 400 mg  400 mg Oral BID Onuoha, Chinwendu V, NP   400 mg at 08/31/22 1035   senna-docusate (Senokot-S) tablet 1 tablet  1 tablet Oral QHS Princess Bruins, DO   1 tablet at 08/30/22 2123   sertraline (ZOLOFT) tablet 150 mg  150 mg Oral Daily Onuoha, Chinwendu V, NP   150 mg at 08/31/22 1035   traZODone (DESYREL) tablet 50  mg  50 mg Oral QHS PRN Onuoha, Chinwendu V, NP   50 mg at 08/30/22 2123   valACYclovir (VALTREX) tablet 500 mg  500 mg Oral Daily Princess Bruins, DO   500 mg at 08/31/22 1036   Current Outpatient Medications  Medication Sig Dispense Refill   ABILIFY MAINTENA 400 MG PRSY prefilled syringe 400 mg every 28 (twenty-eight) days.     cetirizine (ZYRTEC) 10 MG tablet Take 10 mg by mouth daily.     cyclobenzaprine (FLEXERIL) 10 MG tablet Take 1 tablet (10 mg total) by mouth 2 (two) times daily as needed for muscle spasms. 20 tablet 0   fluticasone (FLONASE) 50 MCG/ACT nasal spray Place 1 spray into both nostrils daily.     meloxicam (MOBIC) 15 MG tablet Take 15 mg by mouth daily.     Oxcarbazepine (TRILEPTAL) 300 MG tablet Take 1 tablet (300 mg total) by mouth 2 (two) times daily. 60 tablet 0   QUEtiapine (SEROQUEL) 400 MG tablet Take 1 tablet (400 mg total) by mouth 2 (two) times daily. 60 tablet 0   sertraline (ZOLOFT) 50 MG tablet Take 3 tablets (150 mg total) by mouth in the morning. 90 tablet 0   traZODone (DESYREL) 100 MG tablet Take 1 tablet (100 mg total) by mouth at bedtime. 30 tablet 0   valACYclovir (VALTREX) 500 MG tablet Take 500 mg by mouth daily.      Labs  Lab Results:  Admission on 07/25/2022  Component Date Value Ref Range Status   SARS Coronavirus 2 by RT PCR 07/25/2022 NEGATIVE  NEGATIVE Final   Comment: (NOTE) SARS-CoV-2 target nucleic acids are NOT  DETECTED.  The SARS-CoV-2 RNA is generally detectable in upper respiratory specimens during the acute phase of infection. The lowest concentration of SARS-CoV-2 viral copies this assay can detect is 138 copies/mL. A negative result does not preclude SARS-Cov-2 infection and should not be used as the sole basis for treatment or other patient management decisions. A negative result may occur with  improper specimen collection/handling, submission of specimen other than nasopharyngeal swab, presence of viral mutation(s) within the areas targeted by this assay, and inadequate number of viral copies(<138 copies/mL). A negative result must be combined with clinical observations, patient history, and epidemiological information. The expected result is Negative.  Fact Sheet for Patients:  BloggerCourse.com  Fact Sheet for Healthcare Providers:  SeriousBroker.it  This test is no                          t yet approved or cleared by the [place] FDA and  has been authorized for detection and/or diagnosis of SARS-CoV-2 by FDA under an Emergency Use Authorization (EUA). This EUA will remain  in effect (meaning this test can be used) for the duration of the COVID-19 declaration under Section 564(b)(1) of the Act, 21 U.S.C.section 360bbb-3(b)(1), unless the authorization is terminated  or revoked sooner.       Influenza A by PCR 07/25/2022 NEGATIVE  NEGATIVE Final   Influenza B by PCR 07/25/2022 NEGATIVE  NEGATIVE Final   Comment: (NOTE) The Xpert Xpress SARS-CoV-2/FLU/RSV plus assay is intended as an aid in the diagnosis of influenza from Nasopharyngeal swab specimens and should not be used as a sole basis for treatment. Nasal washings and aspirates are unacceptable for Xpert Xpress SARS-CoV-2/FLU/RSV testing.  Fact Sheet for Patients: BloggerCourse.com  Fact Sheet for Healthcare  Providers: SeriousBroker.it  This test is not yet approved or cleared  by the [place] and has been authorized for detection and/or diagnosis of SARS-CoV-2 by FDA under an Emergency Use Authorization (EUA). This EUA will remain in effect (meaning this test can be used) for the duration of the COVID-19 declaration under Section 564(b)(1) of the Act, 21 U.S.C. section 360bbb-3(b)(1), unless the authorization is terminated or revoked.  Performed at [facility], [address], [place], [place] [address]    WBC 07/25/2022 8.3  4.0 - 10.5 K/uL Final   RBC 07/25/2022 4.44  3.87 - 5.11 MIL/uL Final   Hemoglobin 07/25/2022 13.7  12.0 - 15.0 g/dL Final   HCT 40/07/2724 40.2  36.0 - 46.0 % Final   MCV 07/25/2022 90.5  80.0 - 100.0 fL Final   MCH 07/25/2022 30.9  26.0 - 34.0 pg Final   MCHC 07/25/2022 34.1  30.0 - 36.0 g/dL Final   RDW 36/64/4034 12.2  11.5 - 15.5 % Final   Platelets 07/25/2022 248  150 - 400 K/uL Final   nRBC 07/25/2022 0.0  0.0 - 0.2 % Final   Neutrophils Relative % 07/25/2022 43  % Final   Neutro Abs 07/25/2022 3.6  1.7 - 7.7 K/uL Final   Lymphocytes Relative 07/25/2022 52  % Final   Lymphs Abs 07/25/2022 4.2 (H)  0.7 - 4.0 K/uL Final   Monocytes Relative 07/25/2022 5  % Final   Monocytes Absolute 07/25/2022 0.4  0.1 - 1.0 K/uL Final   Eosinophils Relative 07/25/2022 0  % Final   Eosinophils Absolute 07/25/2022 0.0  0.0 - 0.5 K/uL Final   Basophils Relative 07/25/2022 0  % Final   Basophils Absolute 07/25/2022 0.0  0.0 - 0.1 K/uL Final   Immature Granulocytes 07/25/2022 0  % Final   Abs Immature Granulocytes 07/25/2022 0.02  0.00 - 0.07 K/uL Final   Performed at [facility], [address], [place], [place] [address]   Sodium 07/25/2022 138  135 - 145 mmol/L Final   Potassium 07/25/2022 4.0  3.5 - 5.1 mmol/L Final   Chloride 07/25/2022 104  98 - 111 mmol/L Final   CO2 07/25/2022 29  22 - 32 mmol/L Final   Glucose, Bld  07/25/2022 83  70 - 99 mg/dL Final   Glucose reference range applies only to samples taken after fasting for at least 8 hours.   BUN 07/25/2022 11  6 - 20 mg/dL Final   Creatinine, Ser 07/25/2022 0.97  0.44 - 1.00 mg/dL Final   Calcium 56/38/7564 9.2  8.9 - 10.3 mg/dL Final   Total Protein 33/29/5188 7.0  6.5 - 8.1 g/dL Final   Albumin 41/66/0630 3.8  3.5 - 5.0 g/dL Final   AST 16/10/930 18  15 - 41 U/L Final   ALT 07/25/2022 22  0 - 44 U/L Final   Alkaline Phosphatase 07/25/2022 64  38 - 126 U/L Final   Total Bilirubin 07/25/2022 0.2 (L)  0.3 - 1.2 mg/dL Final   GFR, Estimated 07/25/2022 >60  >60 mL/min Final   Comment: (NOTE) Calculated using the CKD-EPI Creatinine Equation (2021)    Anion gap 07/25/2022 5  5 - 15 Final   Performed at [facility], [address] [place], [place] [address]   POC Amphetamine UR 07/25/2022 None Detected  NONE DETECTED (Cut Off Level 1000 ng/mL) Preliminary   POC Secobarbital (BAR) 07/25/2022 None Detected  NONE DETECTED (Cut Off Level 300 ng/mL) Preliminary   POC Buprenorphine (BUP) 07/25/2022 None Detected  NONE DETECTED (Cut  Off Level 10 ng/mL) Preliminary   POC Oxazepam (BZO) 07/25/2022 None Detected  NONE DETECTED (Cut Off Level 300 ng/mL) Preliminary   POC Cocaine UR 07/25/2022 None Detected  NONE DETECTED (Cut Off Level 300 ng/mL) Preliminary   POC Methamphetamine UR 07/25/2022 None Detected  NONE DETECTED (Cut Off Level 1000 ng/mL) Preliminary   POC Morphine 07/25/2022 None Detected  NONE DETECTED (Cut Off Level 300 ng/mL) Preliminary   POC Methadone UR 07/25/2022 None Detected  NONE DETECTED (Cut Off Level 300 ng/mL) Preliminary   POC Oxycodone UR 07/25/2022 None Detected  NONE DETECTED (Cut Off Level 100 ng/mL) Preliminary   POC Marijuana UR 07/25/2022 None Detected  NONE DETECTED (Cut Off Level 50 ng/mL) Preliminary   SARSCOV2ONAVIRUS 2 AG 07/25/2022 NEGATIVE  NEGATIVE Final   Comment: (NOTE) SARS-CoV-2 antigen NOT DETECTED.    Negative results are presumptive.  Negative results do not preclude SARS-CoV-2 infection and should not be used as the sole basis for treatment or other patient management decisions, including infection  control decisions, particularly in the presence of clinical signs and  symptoms consistent with COVID-19, or in those who have been in contact with the virus.  Negative results must be combined with clinical observations, patient history, and epidemiological information. The expected result is Negative.  Fact Sheet for Patients: https://www.jennings-kim.com/https://www.fda.gov/media/141569/download  Fact Sheet for Healthcare Providers: https://alexander-rogers.biz/https://www.fda.gov/media/141568/download  This test is not yet approved or cleared by the [place] FDA and  has been authorized for detection and/or diagnosis of SARS-CoV-2 by FDA under an Emergency Use Authorization (EUA).  This EUA will remain in effect (meaning this test can be used) for the duration of  the COV                          ID-19 declaration under Section 564(b)(1) of the Act, 21 U.S.C. section 360bbb-3(b)(1), unless the authorization is terminated or revoked sooner.     Cholesterol 07/25/2022 181  0 - 200 mg/dL Final   Triglycerides 16/10/960410/09/2022 118  <150 mg/dL Final   HDL 54/09/811910/09/2022 45  >40 mg/dL Final   Total CHOL/HDL Ratio 07/25/2022 4.0  RATIO Final   VLDL 07/25/2022 24  0 - 40 mg/dL Final   LDL Cholesterol 07/25/2022 112 (H)  0 - 99 mg/dL Final   Comment:        Total Cholesterol/HDL:CHD Risk Coronary Heart Disease Risk Table                     Men   Women  1/2 Average Risk   3.4   3.3  Average Risk       5.0   4.4  2 X Average Risk   9.6   7.1  3 X Average Risk  23.4   11.0        Use the calculated Patient Ratio above and the CHD Risk Table to determine the patient's CHD Risk.        ATP III CLASSIFICATION (LDL):  <100     mg/dL   Optimal  [id number]  mg/dL   Near or Above                    Optimal  130-159  mg/dL   Borderline  [id number]   mg/dL   High  >865>190     mg/dL   Very High Performed at [facility], [address] [place], [place] [id number]    TSH 07/25/2022  6.668 (H)  0.350 - 4.500 uIU/mL Final   Comment: Performed by a 3rd Generation assay with a functional sensitivity of <=0.01 uIU/mL. Performed at [facility], [address], [place], [place] [address]    Glucose-Capillary 07/26/2022 104 (H)  70 - 99 mg/dL Final   Glucose reference range applies only to samples taken after fasting for at least 8 hours.   T3, Free 07/31/2022 2.3  2.0 - 4.4 pg/mL Final   Comment: (NOTE) Performed At: [facility] [address] [place], [place] [id number] Jolene Schimke MD XB:[id number]    Free T4 07/31/2022 0.60 (L)  0.61 - 1.12 ng/dL Final   Comment: (NOTE) Biotin ingestion may interfere with free T4 tests. If the results are inconsistent with the TSH level, previous test results, or the clinical presentation, then consider biotin interference. If needed, order repeat testing after stopping biotin. Performed at [facility], [address], [place], [place] [address]    Glucose-Capillary 08/29/2022 100 (H)  70 - 99 mg/dL Final   Glucose reference range applies only to samples taken after fasting for at least 8 hours.  Admission on 07/04/2022, Discharged on 07/05/2022  Component Date Value Ref Range Status   Sodium 07/04/2022 142  135 - 145 mmol/L Final   Potassium 07/04/2022 4.4  3.5 - 5.1 mmol/L Final   Chloride 07/04/2022 109  98 - 111 mmol/L Final   CO2 07/04/2022 26  22 - 32 mmol/L Final   Glucose, Bld 07/04/2022 96  70 - 99 mg/dL Final   Glucose reference range applies only to samples taken after fasting for at least 8 hours.   BUN 07/04/2022 15  6 - 20 mg/dL Final   Creatinine, Ser 07/04/2022 0.83  0.44 - 1.00 mg/dL Final   Calcium 78/46/9629 9.3  8.9 - 10.3 mg/dL Final   Total Protein 52/84/1324 7.2  6.5 - 8.1 g/dL Final   Albumin 40/07/2724 3.9  3.5 - 5.0 g/dL Final   AST  36/64/4034 23  15 - 41 U/L Final   ALT 07/04/2022 28  0 - 44 U/L Final   Alkaline Phosphatase 07/04/2022 77  38 - 126 U/L Final   Total Bilirubin 07/04/2022 0.4  0.3 - 1.2 mg/dL Final   GFR, Estimated 07/04/2022 >60  >60 mL/min Final   Comment: (NOTE) Calculated using the CKD-EPI Creatinine Equation (2021)    Anion gap 07/04/2022 7  5 - 15 Final   Performed at [facility], [address] [place], [place] [address]   Alcohol, Ethyl (B) 07/04/2022 <10  <10 mg/dL Final   Comment: (NOTE) Lowest detectable limit for serum alcohol is 10 mg/dL.  For medical purposes only. Performed at [facility], [address] [place], [place] [address]    WBC 07/04/2022 7.0  4.0 - 10.5 K/uL Final   RBC 07/04/2022 4.18  3.87 - 5.11 MIL/uL Final   Hemoglobin 07/04/2022 12.8  12.0 - 15.0 g/dL Final   HCT 56/43/3295 39.1  36.0 - 46.0 % Final   MCV 07/04/2022 93.5  80.0 - 100.0 fL Final   MCH 07/04/2022 30.6  26.0 - 34.0 pg Final   MCHC 07/04/2022 32.7  30.0 - 36.0 g/dL Final   RDW 18/84/1660 12.9  11.5 - 15.5 % Final   Platelets 07/04/2022 243  150 - 400 K/uL Final   nRBC 07/04/2022 0.0  0.0 - 0.2 % Final   Neutrophils Relative % 07/04/2022 43  % Final   Neutro  Abs 07/04/2022 3.0  1.7 - 7.7 K/uL Final   Lymphocytes Relative 07/04/2022 50  % Final   Lymphs Abs 07/04/2022 3.5  0.7 - 4.0 K/uL Final   Monocytes Relative 07/04/2022 7  % Final   Monocytes Absolute 07/04/2022 0.5  0.1 - 1.0 K/uL Final   Eosinophils Relative 07/04/2022 0  % Final   Eosinophils Absolute 07/04/2022 0.0  0.0 - 0.5 K/uL Final   Basophils Relative 07/04/2022 0  % Final   Basophils Absolute 07/04/2022 0.0  0.0 - 0.1 K/uL Final   Immature Granulocytes 07/04/2022 0  % Final   Abs Immature Granulocytes 07/04/2022 0.01  0.00 - 0.07 K/uL Final   Performed at [facility], [address], [place], [place] [address]   I-stat hCG, quantitative 07/04/2022 <5.0  <5 mIU/mL Final    Comment 3 07/04/2022          Final   Comment:   GEST. AGE      CONC.  (mIU/mL)   <=1 WEEK        5 - 50     2 WEEKS       50 - 500     3 WEEKS       100 - 10,000     4 WEEKS     1,000 - 30,000        FEMALE AND NON-PREGNANT FEMALE:     LESS THAN 5 mIU/mL   Admission on 06/14/2022, Discharged on 06/14/2022  Component Date Value Ref Range Status   Color, UA 06/14/2022 yellow  yellow Final   Clarity, UA 06/14/2022 cloudy (A)  clear Final   Glucose, UA 06/14/2022 negative  negative mg/dL Final   Bilirubin, UA 19/14/7829 negative  negative Final   Ketones, POC UA 06/14/2022 negative  negative mg/dL Final   Spec Grav, UA 56/21/3086 1.025  1.010 - 1.025 Final   Blood, UA 06/14/2022 negative  negative Final   pH, UA 06/14/2022 7.5  5.0 - 8.0 Final   Protein Ur, POC 06/14/2022 =30 (A)  negative mg/dL Final   Urobilinogen, UA 06/14/2022 0.2  0.2 or 1.0 E.U./dL Final   Nitrite, UA 57/84/6962 Negative  Negative Final   Leukocytes, UA 06/14/2022 Negative  Negative Final   Preg Test, Ur 06/14/2022 Negative  Negative Final   Specimen Description 06/14/2022 URINE, CLEAN CATCH   Final   Special Requests 06/14/2022    Final                   Value:NONE Performed at [facility], [address] [place], [place] [address]    Culture 06/14/2022 MULTIPLE SPECIES PRESENT, SUGGEST RECOLLECTION (A)   Final   Report Status 06/14/2022 06/16/2022 FINAL   Final  Admission on 06/07/2022, Discharged on 06/10/2022  Component Date Value Ref Range Status   SARS Coronavirus 2 by RT PCR 06/07/2022 NEGATIVE  NEGATIVE Final   Comment: (NOTE) SARS-CoV-2 target nucleic acids are NOT DETECTED.  The SARS-CoV-2 RNA is generally detectable in upper respiratory specimens during the acute phase of infection. The lowest concentration of SARS-CoV-2 viral copies this assay can detect is 138 copies/mL. A negative result does not preclude SARS-Cov-2 infection and should not be used as the sole basis for treatment  or other patient management decisions. A negative result may occur with  improper specimen collection/handling, submission of specimen other than nasopharyngeal swab, presence of viral mutation(s) within the areas targeted by this assay, and inadequate number of viral copies(<138  copies/mL). A negative result must be combined with clinical observations, patient history, and epidemiological information. The expected result is Negative.  Fact Sheet for Patients:  BloggerCourse.com  Fact Sheet for Healthcare Providers:  SeriousBroker.it  This test is no                          t yet approved or cleared by the [place] FDA and  has been authorized for detection and/or diagnosis of SARS-CoV-2 by FDA under an Emergency Use Authorization (EUA). This EUA will remain  in effect (meaning this test can be used) for the duration of the COVID-19 declaration under Section 564(b)(1) of the Act, 21 U.S.C.section 360bbb-3(b)(1), unless the authorization is terminated  or revoked sooner.       Influenza A by PCR 06/07/2022 NEGATIVE  NEGATIVE Final   Influenza B by PCR 06/07/2022 NEGATIVE  NEGATIVE Final   Comment: (NOTE) The Xpert Xpress SARS-CoV-2/FLU/RSV plus assay is intended as an aid in the diagnosis of influenza from Nasopharyngeal swab specimens and should not be used as a sole basis for treatment. Nasal washings and aspirates are unacceptable for Xpert Xpress SARS-CoV-2/FLU/RSV testing.  Fact Sheet for Patients: BloggerCourse.com  Fact Sheet for Healthcare Providers: SeriousBroker.it  This test is not yet approved or cleared by the [place] FDA and has been authorized for detection and/or diagnosis of SARS-CoV-2 by FDA under an Emergency Use Authorization (EUA). This EUA will remain in effect (meaning this test can be used) for the duration of the COVID-19 declaration under  Section 564(b)(1) of the Act, 21 U.S.C. section 360bbb-3(b)(1), unless the authorization is terminated or revoked.  Performed at [facility], [address], [place], [place] [address]    WBC 06/07/2022 5.7  4.0 - 10.5 K/uL Final   RBC 06/07/2022 4.39  3.87 - 5.11 MIL/uL Final   Hemoglobin 06/07/2022 13.2  12.0 - 15.0 g/dL Final   HCT 47/82/9562 40.4  36.0 - 46.0 % Final   MCV 06/07/2022 92.0  80.0 - 100.0 fL Final   MCH 06/07/2022 30.1  26.0 - 34.0 pg Final   MCHC 06/07/2022 32.7  30.0 - 36.0 g/dL Final   RDW 13/05/6577 12.4  11.5 - 15.5 % Final   Platelets 06/07/2022 308  150 - 400 K/uL Final   nRBC 06/07/2022 0.0  0.0 - 0.2 % Final   Neutrophils Relative % 06/07/2022 42  % Final   Neutro Abs 06/07/2022 2.4  1.7 - 7.7 K/uL Final   Lymphocytes Relative 06/07/2022 54  % Final   Lymphs Abs 06/07/2022 3.1  0.7 - 4.0 K/uL Final   Monocytes Relative 06/07/2022 4  % Final   Monocytes Absolute 06/07/2022 0.2  0.1 - 1.0 K/uL Final   Eosinophils Relative 06/07/2022 0  % Final   Eosinophils Absolute 06/07/2022 0.0  0.0 - 0.5 K/uL Final   Basophils Relative 06/07/2022 0  % Final   Basophils Absolute 06/07/2022 0.0  0.0 - 0.1 K/uL Final   Immature Granulocytes 06/07/2022 0  % Final   Abs Immature Granulocytes 06/07/2022 0.01  0.00 - 0.07 K/uL Final   Performed at [facility], [address], [place], [place] [address]   Sodium 06/07/2022 139  135 - 145 mmol/L Final   Potassium 06/07/2022 4.0  3.5 - 5.1 mmol/L Final   Chloride 06/07/2022 104  98 - 111 mmol/L Final   CO2 06/07/2022 28  22 - 32 mmol/L Final  Glucose, Bld 06/07/2022 104 (H)  70 - 99 mg/dL Final   Glucose reference range applies only to samples taken after fasting for at least 8 hours.   BUN 06/07/2022 8  6 - 20 mg/dL Final   Creatinine, Ser 06/07/2022 0.84  0.44 - 1.00 mg/dL Final   Calcium 82/50/5397 9.1  8.9 - 10.3 mg/dL Final   Total Protein 67/34/1937 6.9  6.5 - 8.1 g/dL Final   Albumin 90/24/0973 3.7  3.5  - 5.0 g/dL Final   AST 53/29/9242 19  15 - 41 U/L Final   ALT 06/07/2022 22  0 - 44 U/L Final   Alkaline Phosphatase 06/07/2022 54  38 - 126 U/L Final   Total Bilirubin 06/07/2022 0.4  0.3 - 1.2 mg/dL Final   GFR, Estimated 06/07/2022 >60  >60 mL/min Final   Comment: (NOTE) Calculated using the CKD-EPI Creatinine Equation (2021)    Anion gap 06/07/2022 7  5 - 15 Final   Performed at [facility], [address], [place], [place] [address]   Hgb A1c MFr Bld 06/07/2022 5.0  4.8 - 5.6 % Final   Comment: (NOTE) Pre diabetes:          5.7%-6.4%  Diabetes:              >6.4%  Glycemic control for   <7.0% adults with diabetes    Mean Plasma Glucose 06/07/2022 96.8  mg/dL Final   Performed at [facility], [address], [place], [place] [address]   TSH 06/07/2022 1.620  0.350 - 4.500 uIU/mL Final   Comment: Performed by a 3rd Generation assay with a functional sensitivity of <=0.01 uIU/mL. Performed at [facility], [address] [place], [place] [address]    RPR Ser Ql 06/07/2022 NON REACTIVE  NON REACTIVE Final   Performed at [facility], [address], [place], [place] [address]   Color, Urine 06/07/2022 YELLOW  YELLOW Final   APPearance 06/07/2022 HAZY (A)  CLEAR Final   Specific Gravity, Urine 06/07/2022 1.018  1.005 - 1.030 Final   pH 06/07/2022 7.0  5.0 - 8.0 Final   Glucose, UA 06/07/2022 NEGATIVE  NEGATIVE mg/dL Final   Hgb urine dipstick 06/07/2022 NEGATIVE  NEGATIVE Final   Bilirubin Urine 06/07/2022 NEGATIVE  NEGATIVE Final   Ketones, ur 06/07/2022 NEGATIVE  NEGATIVE mg/dL Final   Protein, ur 17/40/8144 NEGATIVE  NEGATIVE mg/dL Final   Nitrite 81/85/6314 NEGATIVE  NEGATIVE Final   Leukocytes,Ua 06/07/2022 NEGATIVE  NEGATIVE Final   Performed at [facility], [address], [place], [place] [address]   Cholesterol 06/07/2022 164  0 - 200 mg/dL Final   Triglycerides 37/85/8850 158 (H)  <150 mg/dL Final   HDL 27/74/1287 44  >40 mg/dL  Final   Total CHOL/HDL Ratio 06/07/2022 3.7  RATIO Final   VLDL 06/07/2022 32  0 - 40 mg/dL Final   LDL Cholesterol 06/07/2022 88  0 - 99 mg/dL Final   Comment:        Total Cholesterol/HDL:CHD Risk Coronary Heart Disease Risk Table                     Men   Women  1/2 Average Risk   3.4   3.3  Average Risk       5.0   4.4  2 X Average Risk   9.6   7.1  3 X Average Risk  23.4   11.0  Use the calculated Patient Ratio above and the CHD Risk Table to determine the patient's CHD Risk.        ATP III CLASSIFICATION (LDL):  <100     mg/dL   Optimal  294-765  mg/dL   Near or Above                    Optimal  130-159  mg/dL   Borderline  465-035  mg/dL   High  >465     mg/dL   Very High Performed at [facility], [address], [place], [place] [address]    HIV Screen 4th Generation wRfx 06/07/2022 Non Reactive  Non Reactive Final   Performed at [facility], [address], [place], [place] [address]   SARSCOV2ONAVIRUS 2 AG 06/07/2022 NEGATIVE  NEGATIVE Final   Comment: (NOTE) SARS-CoV-2 antigen NOT DETECTED.   Negative results are presumptive.  Negative results do not preclude SARS-CoV-2 infection and should not be used as the sole basis for treatment or other patient management decisions, including infection  control decisions, particularly in the presence of clinical signs and  symptoms consistent with COVID-19, or in those who have been in contact with the virus.  Negative results must be combined with clinical observations, patient history, and epidemiological information. The expected result is Negative.  Fact Sheet for Patients: https://www.jennings-kim.com/  Fact Sheet for Healthcare Providers: https://alexander-rogers.biz/  This test is not yet approved or cleared by the [place] FDA and  has been authorized for detection and/or diagnosis of SARS-CoV-2 by FDA under an Emergency Use Authorization (EUA).  This EUA  will remain in effect (meaning this test can be used) for the duration of  the COV                          ID-19 declaration under Section 564(b)(1) of the Act, 21 U.S.C. section 360bbb-3(b)(1), unless the authorization is terminated or revoked sooner.     POC Amphetamine UR 06/07/2022 None Detected  NONE DETECTED (Cut Off Level 1000 ng/mL) Final   POC Secobarbital (BAR) 06/07/2022 None Detected  NONE DETECTED (Cut Off Level 300 ng/mL) Final   POC Buprenorphine (BUP) 06/07/2022 None Detected  NONE DETECTED (Cut Off Level 10 ng/mL) Final   POC Oxazepam (BZO) 06/07/2022 None Detected  NONE DETECTED (Cut Off Level 300 ng/mL) Final   POC Cocaine UR 06/07/2022 None Detected  NONE DETECTED (Cut Off Level 300 ng/mL) Final   POC Methamphetamine UR 06/07/2022 None Detected  NONE DETECTED (Cut Off Level 1000 ng/mL) Final   POC Morphine 06/07/2022 None Detected  NONE DETECTED (Cut Off Level 300 ng/mL) Final   POC Methadone UR 06/07/2022 None Detected  NONE DETECTED (Cut Off Level 300 ng/mL) Final   POC Oxycodone UR 06/07/2022 None Detected  NONE DETECTED (Cut Off Level 100 ng/mL) Final   POC Marijuana UR 06/07/2022 None Detected  NONE DETECTED (Cut Off Level 50 ng/mL) Final   Preg Test, Ur 06/08/2022 NEGATIVE  NEGATIVE Final   Comment:        THE SENSITIVITY OF THIS METHODOLOGY IS >20 mIU/mL. Performed at [facility], [address] [place], [place] [address]    Preg Test, Ur 06/08/2022 NEGATIVE  NEGATIVE Final   Comment:        THE SENSITIVITY OF THIS METHODOLOGY IS >24 mIU/mL     Blood Alcohol level:  Lab Results  Component Value Date  ETH <10 07/04/2022   ETH <10 02/07/2022    Metabolic Disorder Labs: Lab Results  Component Value Date   HGBA1C 5.0 06/07/2022   MPG 96.8 06/07/2022   MPG 96.8 02/07/2022   No results found for: "PROLACTIN" Lab Results  Component Value Date   CHOL 181 07/25/2022   TRIG 118 07/25/2022   HDL 45 07/25/2022   CHOLHDL 4.0 07/25/2022    VLDL 24 07/25/2022   LDLCALC 112 (H) 07/25/2022   LDLCALC 88 06/07/2022    Therapeutic Lab Levels: No results found for: "LITHIUM" No results found for: "VALPROATE" No results found for: "CBMZ"  Physical Findings   GAD-7    Flowsheet Row Office Visit from 12/17/2021 in [facility]  Total GAD-7 Score 17      PHQ2-9    Flowsheet Row ED from 06/07/2022 in [facility] Office Visit from 12/17/2021 in [facility]  PHQ-2 Total Score 1 2  PHQ-9 Total Score 2 8      Flowsheet Row ED from 07/25/2022 in [facility] ED from 07/04/2022 in [facility] ED from 06/14/2022 in [facility]   C-SSRS RISK CATEGORY High Risk No Risk No Risk        Musculoskeletal  Strength & Muscle Tone: within normal limits Gait & Station: normal Patient leans: N/A  Psychiatric Specialty Exam  Presentation  General Appearance:  Appropriate for Environment; Casual  Eye Contact: Good  Speech: Clear and Coherent; Normal Rate  Speech Volume: Normal  Handedness: Right   Mood and Affect  Mood: Euthymic  Affect: Appropriate; Congruent   Thought Process  Thought Processes: Coherent; Goal Directed; Linear  Descriptions of Associations:Intact  Orientation:Full (Time, Place and Person)  Thought Content:Logical; WDL  Diagnosis of Schizophrenia or Schizoaffective disorder in past: No    Hallucinations:Hallucinations: None  Ideas of Reference:None  Suicidal Thoughts:Suicidal Thoughts: No  Homicidal Thoughts:Homicidal Thoughts: No   Sensorium  Memory: Immediate Good; Recent Good  Judgment: Intact  Insight: Present   Executive Functions  Concentration: Fair  Attention Span: Fair  Recall: [employer] of Knowledge: Fair  Language: Fair   Psychomotor Activity  Psychomotor Activity: Psychomotor  Activity: Normal   Assets  Assets: Communication Skills; Desire for Improvement   Sleep  Sleep: Sleep: Good   No data recorded  Physical Exam  Physical Exam Vitals and nursing note reviewed.  Constitutional:      Appearance: Normal appearance. She is well-developed.  HENT:     Head: Normocephalic and atraumatic.     Nose: Nose normal.  Cardiovascular:     Rate and Rhythm: Normal rate.  Pulmonary:     Effort: Pulmonary effort is normal.  Musculoskeletal:        General: Normal range of motion.     Cervical back: Normal range of motion.  Skin:    General: Skin is warm and dry.  Neurological:     Mental Status: She is alert and oriented to person, place, and time.  Psychiatric:        Attention and Perception: Attention and perception normal.        Mood and Affect: Mood and affect normal.        Speech: Speech normal.        Behavior: Behavior normal.        Thought Content: Thought content normal.        Cognition  and Memory: Cognition normal.    Review of Systems  Constitutional: Negative.   HENT: Negative.    Eyes: Negative.   Respiratory: Negative.    Cardiovascular: Negative.   Gastrointestinal:  Positive for constipation.       Reports most recent BM 4 days ago  Genitourinary: Negative.   Musculoskeletal: Negative.   Skin: Negative.   Neurological: Negative.    Blood pressure 109/74, pulse 100, temperature (!) 97.3 F (36.3 C), temperature source Oral, resp. rate 16, SpO2 99 %. There is no height or weight on file to calculate BMI.  Treatment Plan Summary: Daily contact with patient to assess and evaluate symptoms and progress in treatment Patient remains cleared by psychiatry.  Disposition is pending placement, transition of care staff pursuing disposition plan.  Lenard Lance, FNP 08/31/2022 11:34 AM

## 2022-09-01 ENCOUNTER — Encounter (HOSPITAL_COMMUNITY): Payer: Self-pay | Admitting: Registered Nurse

## 2022-09-01 MED ORDER — FLEET ENEMA 7-19 GM/118ML RE ENEM
1.0000 | ENEMA | Freq: Once | RECTAL | Status: DC
  Filled 2022-09-01: qty 1

## 2022-09-01 NOTE — ED Provider Notes (Signed)
Behavioral Health Progress Note  Date and Time: 09/01/2022 9:58 AM Name: Nicole [name] MRN:  [id number]  Subjective:  Nicole [name] is a 31 y.o. female, with PMH of SCZ unspecified, MDD with psychosis, who presented voluntary to [facility] (07/25/2022) via [facility] after a verbal altercation with Nicole [name] (not patient's legal guardian) for transient SI.  This is patient's fourth visit to [facility] for similar concerns this year. Patient has been dismissed from previous group home due to threatening SI and HI, then leaving the group home.   Legal Guardian: Mom Nicole [name]) transitioning to be Dad Nicole [name]) Point of contact: Dad Nicole [name])   Nicole [name], 69 y.o., female patient seen face to face by this provider for psychiatric reassessment, consulted with Dr. Nelly Rout; and chart reviewed on 09/01/22.  On evaluation Nicole [name] reports she is not feeling well this morning.  States she is now feeling nausea related to today being her 5th day without having a bowel movement.  She states she continues to eat but it makes her nauseous afterwards.  She states she is taking her medications without adverse reaction.  She denies suicidal/self-harm/homicidal ideation, psychosis, and paranoia.     During evaluation Nicole [name] is sitting up in bed with no noted distress.  He is alert, oriented x 4, calm, cooperative and attentive.  Her mood is euthymic with congruent affect.  She has normal speech, and behavior.  Objectively there is no evidence of psychosis/mania or delusional thinking.  Patient is able to converse coherently, goal directed thoughts, no distractibility, or pre-occupation.  She also denies suicidal/self-harm/homicidal ideation, psychosis, and paranoia.  Patient answered question appropriately.      Continue Miralax and ordered fleet enema x1, also encouraged prune juice.     Diagnosis:  Final diagnoses:  Severe episode of recurrent major depressive disorder, with psychotic  features (HCC)    Total Time spent with patient: 20 minutes  Past Psychiatric History: above  Past Medical History:  Past Medical History:  Diagnosis Date   Anxiety    Depression    Hypothyroidism 08/07/2022   Tobacco use disorder 08/07/2022    Past Surgical History:  Procedure Laterality Date   WISDOM TOOTH EXTRACTION Bilateral 2020   Family History:  Family History  Problem Relation Age of Onset   Hypertension Father    Diabetes Father    Family Psychiatric  History: see above Social History:  Social History   Substance and Sexual Activity  Alcohol Use Never     Social History   Substance and Sexual Activity  Drug Use Never    Social History   Socioeconomic History   Marital status: Single    Spouse name: Not on file   Number of children: Not on file   Years of education: Not on file   Highest education level: Not on file  Occupational History   Not on file  Tobacco Use   Smoking status: Every Day    Types: Cigarettes   Smokeless tobacco: Never  Vaping Use   Vaping Use: Some days  Substance and Sexual Activity   Alcohol use: Never   Drug use: Never   Sexual activity: Yes    Partners: Female    Birth control/protection: Implant  Other Topics Concern   Not on file  Social History Narrative   Not on file   Social Determinants of Health   Financial Resource Strain: Not on file  Food Insecurity: Not on file  Transportation Needs: Not on file  Physical Activity: Not on file  Stress: Not on file  Social Connections: Not on file   SDOH:  SDOH Screenings   Depression (PHQ2-9): Low Risk  (06/10/2022)  Tobacco Use: High Risk (09/01/2022)   Additional Social History:    Pain Medications: SEE MAR Prescriptions: SEE MAR Over the Counter: SEE MAR History of alcohol / drug use?: Yes Longest period of sobriety (when/how long): N/A Negative Consequences of Use:  (NONE) Withdrawal Symptoms: None Name of Substance 1: ETOH IN PAST--PT CURRENTLY  DENIES 1 - Frequency: SOCIAL USE ETOH IN PAST 1 - Method of Aquiring: LEGAL 1- Route of Use: ORAL DRINK Name of Substance 2: NICOTINE-VAPE AND CIGARETTES 2 - Amount (size/oz): LESS THAN ONE PACK 2 - Frequency: DAILY 2 - Method of Aquiring: LEGAL 2 - Route of Substance Use: ORAL SMOKE    Sleep: Good  Appetite:  Fair  Current Medications:  Current Facility-Administered Medications  Medication Dose Route Frequency Provider Last Rate Last Admin   acetaminophen (TYLENOL) tablet 650 mg  650 mg Oral Q6H PRN Onuoha, Chinwendu V, NP   650 mg at 08/30/22 1856   alum & mag hydroxide-simeth (MAALOX/MYLANTA) 200-200-20 MG/5ML suspension 30 mL  30 mL Oral Q4H PRN Onuoha, Chinwendu V, NP   30 mL at 07/27/22 2151   ARIPiprazole ER (ABILIFY MAINTENA) injection 400 mg  400 mg Intramuscular Q28 days Princess Bruins, DO   400 mg at 08/30/22 1338   clotrimazole (LOTRIMIN) 1 % cream   Topical BID Bobbitt, Shalon E, NP   1 Application at 08/28/22 2130   hydrOXYzine (ATARAX) tablet 25 mg  25 mg Oral TID PRN Onuoha, Chinwendu V, NP   25 mg at 08/27/22 2233   ibuprofen (ADVIL) tablet 400 mg  400 mg Oral Q6H PRN Princess Bruins, DO   400 mg at 08/30/22 2123   levothyroxine (SYNTHROID) tablet 100 mcg  100 mcg Oral Q0600 Princess Bruins, DO   100 mcg at 09/01/22 2947   ziprasidone (GEODON) injection 20 mg  20 mg Intramuscular Q12H PRN Onuoha, Chinwendu V, NP       And   LORazepam (ATIVAN) tablet 1 mg  1 mg Oral PRN Onuoha, Chinwendu V, NP       nicotine (NICODERM CQ - dosed in mg/24 hours) patch 14 mg  14 mg Transdermal Q0600 Onuoha, Chinwendu V, NP   14 mg at 09/01/22 0825   ondansetron (ZOFRAN-ODT) disintegrating tablet 4 mg  4 mg Oral Q8H PRN Sindy Guadeloupe, NP   4 mg at 08/30/22 2122   Oxcarbazepine (TRILEPTAL) tablet 300 mg  300 mg Oral BID Onuoha, Chinwendu V, NP   300 mg at 09/01/22 0822   pantoprazole (PROTONIX) EC tablet 20 mg  20 mg Oral Daily Princess Bruins, DO   20 mg at 09/01/22 6546   polyethylene glycol  (MIRALAX / GLYCOLAX) packet 17 g  17 g Oral Daily PRN Lenard Lance, FNP   17 g at 08/31/22 2205   QUEtiapine (SEROQUEL) tablet 400 mg  400 mg Oral BID Onuoha, Chinwendu V, NP   400 mg at 09/01/22 5035   senna-docusate (Senokot-S) tablet 1 tablet  1 tablet Oral QHS Princess Bruins, DO   1 tablet at 08/31/22 2204   sertraline (ZOLOFT) tablet 150 mg  150 mg Oral Daily Onuoha, Chinwendu V, NP   150 mg at 09/01/22 0821   traZODone (DESYREL) tablet 50 mg  50 mg Oral QHS PRN Onuoha, Chinwendu V, NP   50 mg at 08/31/22 2355  valACYclovir (VALTREX) tablet 500 mg  500 mg Oral Daily Princess Bruins, DO   500 mg at 09/01/22 4098   Current Outpatient Medications  Medication Sig Dispense Refill   ABILIFY MAINTENA 400 MG PRSY prefilled syringe 400 mg every 28 (twenty-eight) days.     cetirizine (ZYRTEC) 10 MG tablet Take 10 mg by mouth daily.     cyclobenzaprine (FLEXERIL) 10 MG tablet Take 1 tablet (10 mg total) by mouth 2 (two) times daily as needed for muscle spasms. 20 tablet 0   fluticasone (FLONASE) 50 MCG/ACT nasal spray Place 1 spray into both nostrils daily.     meloxicam (MOBIC) 15 MG tablet Take 15 mg by mouth daily.     Oxcarbazepine (TRILEPTAL) 300 MG tablet Take 1 tablet (300 mg total) by mouth 2 (two) times daily. 60 tablet 0   QUEtiapine (SEROQUEL) 400 MG tablet Take 1 tablet (400 mg total) by mouth 2 (two) times daily. 60 tablet 0   sertraline (ZOLOFT) 50 MG tablet Take 3 tablets (150 mg total) by mouth in the morning. 90 tablet 0   traZODone (DESYREL) 100 MG tablet Take 1 tablet (100 mg total) by mouth at bedtime. 30 tablet 0   valACYclovir (VALTREX) 500 MG tablet Take 500 mg by mouth daily.      Labs  Lab Results:  Admission on 07/25/2022  Component Date Value Ref Range Status   SARS Coronavirus 2 by RT PCR 07/25/2022 NEGATIVE  NEGATIVE Final   Comment: (NOTE) SARS-CoV-2 target nucleic acids are NOT DETECTED.  The SARS-CoV-2 RNA is generally detectable in upper respiratory specimens  during the acute phase of infection. The lowest concentration of SARS-CoV-2 viral copies this assay can detect is 138 copies/mL. A negative result does not preclude SARS-Cov-2 infection and should not be used as the sole basis for treatment or other patient management decisions. A negative result may occur with  improper specimen collection/handling, submission of specimen other than nasopharyngeal swab, presence of viral mutation(s) within the areas targeted by this assay, and inadequate number of viral copies(<138 copies/mL). A negative result must be combined with clinical observations, patient history, and epidemiological information. The expected result is Negative.  Fact Sheet for Patients:  BloggerCourse.com  Fact Sheet for Healthcare Providers:  SeriousBroker.it  This test is no                          t yet approved or cleared by the [place] FDA and  has been authorized for detection and/or diagnosis of SARS-CoV-2 by FDA under an Emergency Use Authorization (EUA). This EUA will remain  in effect (meaning this test can be used) for the duration of the COVID-19 declaration under Section 564(b)(1) of the Act, 21 U.S.C.section 360bbb-3(b)(1), unless the authorization is terminated  or revoked sooner.       Influenza A by PCR 07/25/2022 NEGATIVE  NEGATIVE Final   Influenza B by PCR 07/25/2022 NEGATIVE  NEGATIVE Final   Comment: (NOTE) The Xpert Xpress SARS-CoV-2/FLU/RSV plus assay is intended as an aid in the diagnosis of influenza from Nasopharyngeal swab specimens and should not be used as a sole basis for treatment. Nasal washings and aspirates are unacceptable for Xpert Xpress SARS-CoV-2/FLU/RSV testing.  Fact Sheet for Patients: BloggerCourse.com  Fact Sheet for Healthcare Providers: SeriousBroker.it  This test is not yet approved or cleared by the [place] FDA and has been authorized for detection and/or diagnosis of SARS-CoV-2 by FDA under an Emergency  Use Authorization (EUA). This EUA will remain in effect (meaning this test can be used) for the duration of the COVID-19 declaration under Section 564(b)(1) of the Act, 21 U.S.C. section 360bbb-3(b)(1), unless the authorization is terminated or revoked.  Performed at [facility], [address] [place], [place] [address]    WBC 07/25/2022 8.3  4.0 - 10.5 K/uL Final   RBC 07/25/2022 4.44  3.87 - 5.11 MIL/uL Final   Hemoglobin 07/25/2022 [facility]  12.0 - 15.0 g/dL Final   HCT 60/45/409810/09/2022 40.2  36.0 - 46.0 % Final   MCV 07/25/2022 90.5  80.0 - 100.0 fL Final   MCH 07/25/2022 30.9  26.0 - 34.0 pg Final   MCHC 07/25/2022 34.1  30.0 - 36.0 g/dL Final   RDW 11/91/478210/09/2022 12.2  11.5 - 15.5 % Final   Platelets 07/25/2022 248  150 - 400 K/uL Final   nRBC 07/25/2022 0.0  0.0 - 0.2 % Final   Neutrophils Relative % 07/25/2022 43  % Final   Neutro Abs 07/25/2022 3.6  1.7 - 7.7 K/uL Final   Lymphocytes Relative 07/25/2022 52  % Final   Lymphs Abs 07/25/2022 4.2 (H)  0.7 - 4.0 K/uL Final   Monocytes Relative 07/25/2022 5  % Final   Monocytes Absolute 07/25/2022 0.4  0.1 - 1.0 K/uL Final   Eosinophils Relative 07/25/2022 0  % Final   Eosinophils Absolute 07/25/2022 0.0  0.0 - 0.5 K/uL Final   Basophils Relative 07/25/2022 0  % Final   Basophils Absolute 07/25/2022 0.0  0.0 - 0.1 K/uL Final   Immature Granulocytes 07/25/2022 0  % Final   Abs Immature Granulocytes 07/25/2022 0.02  0.00 - 0.07 K/uL Final   Performed at [facility], [address] [place], [place] [address]   Sodium 07/25/2022 138  135 - 145 mmol/L Final   Potassium 07/25/2022 4.0  3.5 - 5.1 mmol/L Final   Chloride 07/25/2022 104  98 - 111 mmol/L Final   CO2 07/25/2022 29  22 - 32 mmol/L Final   Glucose, Bld 07/25/2022 83  70 - 99 mg/dL Final   Glucose reference range applies only to samples taken after fasting for at  least 8 hours.   BUN 07/25/2022 11  6 - 20 mg/dL Final   Creatinine, Ser 07/25/2022 0.97  0.44 - 1.00 mg/dL Final   Calcium 30/86/578410/09/2022 9.2  8.9 - 10.3 mg/dL Final   Total Protein 69/62/952810/09/2022 7.0  6.5 - 8.1 g/dL Final   Albumin 41/32/440110/09/2022 3.8  3.5 - 5.0 g/dL Final   AST 02/72/536610/09/2022 18  15 - 41 U/L Final   ALT 07/25/2022 22  0 - 44 U/L Final   Alkaline Phosphatase 07/25/2022 64  38 - 126 U/L Final   Total Bilirubin 07/25/2022 0.2 (L)  0.3 - 1.2 mg/dL Final   GFR, Estimated 07/25/2022 >60  >60 mL/min Final   Comment: (NOTE) Calculated using the CKD-EPI Creatinine Equation (2021)    Anion gap 07/25/2022 5  5 - 15 Final   Performed at [facility], [address] [place], [place] [address]   POC Amphetamine UR 07/25/2022 None Detected  NONE DETECTED (Cut Off Level 1000 ng/mL) Preliminary   POC Secobarbital (BAR) 07/25/2022 None Detected  NONE DETECTED (Cut Off Level 300 ng/mL) Preliminary   POC Buprenorphine (BUP) 07/25/2022 None Detected  NONE DETECTED (Cut Off Level 10 ng/mL) Preliminary   POC Oxazepam (BZO) 07/25/2022 None Detected  NONE DETECTED (Cut Off Level 300  ng/mL) Preliminary   POC Cocaine UR 07/25/2022 None Detected  NONE DETECTED (Cut Off Level 300 ng/mL) Preliminary   POC Methamphetamine UR 07/25/2022 None Detected  NONE DETECTED (Cut Off Level 1000 ng/mL) Preliminary   POC Morphine 07/25/2022 None Detected  NONE DETECTED (Cut Off Level 300 ng/mL) Preliminary   POC Methadone UR 07/25/2022 None Detected  NONE DETECTED (Cut Off Level 300 ng/mL) Preliminary   POC Oxycodone UR 07/25/2022 None Detected  NONE DETECTED (Cut Off Level 100 ng/mL) Preliminary   POC Marijuana UR 07/25/2022 None Detected  NONE DETECTED (Cut Off Level 50 ng/mL) Preliminary   SARSCOV2ONAVIRUS 2 AG 07/25/2022 NEGATIVE  NEGATIVE Final   Comment: (NOTE) SARS-CoV-2 antigen NOT DETECTED.   Negative results are presumptive.  Negative results do not preclude SARS-CoV-2 infection and should not be used as the  sole basis for treatment or other patient management decisions, including infection  control decisions, particularly in the presence of clinical signs and  symptoms consistent with COVID-19, or in those who have been in contact with the virus.  Negative results must be combined with clinical observations, patient history, and epidemiological information. The expected result is Negative.  Fact Sheet for Patients: https://www.jennings-kim.com/  Fact Sheet for Healthcare Providers: https://alexander-rogers.biz/  This test is not yet approved or cleared by the [place] FDA and  has been authorized for detection and/or diagnosis of SARS-CoV-2 by FDA under an Emergency Use Authorization (EUA).  This EUA will remain in effect (meaning this test can be used) for the duration of  the COV                          ID-19 declaration under Section 564(b)(1) of the Act, 21 U.S.C. section 360bbb-3(b)(1), unless the authorization is terminated or revoked sooner.     Cholesterol 07/25/2022 181  0 - 200 mg/dL Final   Triglycerides 29/56/2130 118  <150 mg/dL Final   HDL 86/57/8469 45  >40 mg/dL Final   Total CHOL/HDL Ratio 07/25/2022 4.0  RATIO Final   VLDL 07/25/2022 24  0 - 40 mg/dL Final   LDL Cholesterol 07/25/2022 112 (H)  0 - 99 mg/dL Final   Comment:        Total Cholesterol/HDL:CHD Risk Coronary Heart Disease Risk Table                     Men   Women  1/2 Average Risk   3.4   3.3  Average Risk       5.0   4.4  2 X Average Risk   9.6   7.1  3 X Average Risk  23.4   11.0        Use the calculated Patient Ratio above and the CHD Risk Table to determine the patient's CHD Risk.        ATP III CLASSIFICATION (LDL):  <100     mg/dL   Optimal  629-528  mg/dL   Near or Above                    Optimal  130-159  mg/dL   Borderline  413-244  mg/dL   High  >010     mg/dL   Very High Performed at [facility], [address] [place], [place] [address]     TSH 07/25/2022 6.668 (H)  0.350 - 4.500 uIU/mL Final   Comment: Performed by a 3rd Generation assay with a functional  sensitivity of <=0.01 uIU/mL. Performed at [facility], [address] [place], [place] [address]    Glucose-Capillary 07/26/2022 104 (H)  70 - 99 mg/dL Final   Glucose reference range applies only to samples taken after fasting for at least 8 hours.   T3, Free 07/31/2022 2.3  2.0 - 4.4 pg/mL Final   Comment: (NOTE) Performed At: [facility] [address] [place], [place] [id number] Jolene Schimke MD NU:[id number]    Free T4 07/31/2022 0.60 (L)  0.61 - 1.12 ng/dL Final   Comment: (NOTE) Biotin ingestion may interfere with free T4 tests. If the results are inconsistent with the TSH level, previous test results, or the clinical presentation, then consider biotin interference. If needed, order repeat testing after stopping biotin. Performed at [facility], [address], [place], [place] [address]    Glucose-Capillary 08/29/2022 100 (H)  70 - 99 mg/dL Final   Glucose reference range applies only to samples taken after fasting for at least 8 hours.  Admission on 07/04/2022, Discharged on 07/05/2022  Component Date Value Ref Range Status   Sodium 07/04/2022 142  135 - 145 mmol/L Final   Potassium 07/04/2022 4.4  3.5 - 5.1 mmol/L Final   Chloride 07/04/2022 109  98 - 111 mmol/L Final   CO2 07/04/2022 26  22 - 32 mmol/L Final   Glucose, Bld 07/04/2022 96  70 - 99 mg/dL Final   Glucose reference range applies only to samples taken after fasting for at least 8 hours.   BUN 07/04/2022 15  6 - 20 mg/dL Final   Creatinine, Ser 07/04/2022 0.83  0.44 - 1.00 mg/dL Final   Calcium 59/56/3875 9.3  8.9 - 10.3 mg/dL Final   Total Protein 64/33/2951 7.2  6.5 - 8.1 g/dL Final   Albumin 88/41/6606 3.9  3.5 - 5.0 g/dL Final   AST 30/16/0109 23  15 - 41 U/L Final   ALT 07/04/2022 28  0 - 44 U/L Final   Alkaline Phosphatase 07/04/2022 77  38 - 126 U/L Final    Total Bilirubin 07/04/2022 0.4  0.3 - 1.2 mg/dL Final   GFR, Estimated 07/04/2022 >60  >60 mL/min Final   Comment: (NOTE) Calculated using the CKD-EPI Creatinine Equation (2021)    Anion gap 07/04/2022 7  5 - 15 Final   Performed at [facility], [address], [place], [place] [address]   Alcohol, Ethyl (B) 07/04/2022 <10  <10 mg/dL Final   Comment: (NOTE) Lowest detectable limit for serum alcohol is 10 mg/dL.  For medical purposes only. Performed at [facility], [address], [place], [place] [address]    WBC 07/04/2022 7.0  4.0 - 10.5 K/uL Final   RBC 07/04/2022 4.18  3.87 - 5.11 MIL/uL Final   Hemoglobin 07/04/2022 12.8  12.0 - 15.0 g/dL Final   HCT 25/42/7062 39.1  36.0 - 46.0 % Final   MCV 07/04/2022 93.5  80.0 - 100.0 fL Final   MCH 07/04/2022 30.6  26.0 - 34.0 pg Final   MCHC 07/04/2022 32.7  30.0 - 36.0 g/dL Final   RDW 37/62/8315 12.9  11.5 - 15.5 % Final   Platelets 07/04/2022 243  150 - 400 K/uL Final   nRBC 07/04/2022 0.0  0.0 - 0.2 % Final   Neutrophils Relative % 07/04/2022 43  % Final   Neutro Abs 07/04/2022 3.0  1.7 - 7.7 K/uL Final   Lymphocytes Relative 07/04/2022 50  % Final  Lymphs Abs 07/04/2022 3.5  0.7 - 4.0 K/uL Final   Monocytes Relative 07/04/2022 7  % Final   Monocytes Absolute 07/04/2022 0.5  0.1 - 1.0 K/uL Final   Eosinophils Relative 07/04/2022 0  % Final   Eosinophils Absolute 07/04/2022 0.0  0.0 - 0.5 K/uL Final   Basophils Relative 07/04/2022 0  % Final   Basophils Absolute 07/04/2022 0.0  0.0 - 0.1 K/uL Final   Immature Granulocytes 07/04/2022 0  % Final   Abs Immature Granulocytes 07/04/2022 0.01  0.00 - 0.07 K/uL Final   Performed at [facility], [address], [place], [place] [address]   I-stat hCG, quantitative 07/04/2022 <5.0  <5 mIU/mL Final   Comment 3 07/04/2022          Final   Comment:   GEST. AGE      CONC.  (mIU/mL)   <=1 WEEK        5 - 50     2 WEEKS       50  - 500     3 WEEKS       100 - 10,000     4 WEEKS     1,000 - 30,000        FEMALE AND NON-PREGNANT FEMALE:     LESS THAN 5 mIU/mL   Admission on 06/14/2022, Discharged on 06/14/2022  Component Date Value Ref Range Status   Color, UA 06/14/2022 yellow  yellow Final   Clarity, UA 06/14/2022 cloudy (A)  clear Final   Glucose, UA 06/14/2022 negative  negative mg/dL Final   Bilirubin, UA 19/14/7829 negative  negative Final   Ketones, POC UA 06/14/2022 negative  negative mg/dL Final   Spec Grav, UA 56/21/3086 1.025  1.010 - 1.025 Final   Blood, UA 06/14/2022 negative  negative Final   pH, UA 06/14/2022 7.5  5.0 - 8.0 Final   Protein Ur, POC 06/14/2022 =30 (A)  negative mg/dL Final   Urobilinogen, UA 06/14/2022 0.2  0.2 or 1.0 E.U./dL Final   Nitrite, UA 57/84/6962 Negative  Negative Final   Leukocytes, UA 06/14/2022 Negative  Negative Final   Preg Test, Ur 06/14/2022 Negative  Negative Final   Specimen Description 06/14/2022 URINE, CLEAN CATCH   Final   Special Requests 06/14/2022    Final                   Value:NONE Performed at [facility], [address], [place], [place] [address]    Culture 06/14/2022 MULTIPLE SPECIES PRESENT, SUGGEST RECOLLECTION (A)   Final   Report Status 06/14/2022 06/16/2022 FINAL   Final  Admission on 06/07/2022, Discharged on 06/10/2022  Component Date Value Ref Range Status   SARS Coronavirus 2 by RT PCR 06/07/2022 NEGATIVE  NEGATIVE Final   Comment: (NOTE) SARS-CoV-2 target nucleic acids are NOT DETECTED.  The SARS-CoV-2 RNA is generally detectable in upper respiratory specimens during the acute phase of infection. The lowest concentration of SARS-CoV-2 viral copies this assay can detect is 138 copies/mL. A negative result does not preclude SARS-Cov-2 infection and should not be used as the sole basis for treatment or other patient management decisions. A negative result may occur with  improper specimen collection/handling, submission of  specimen other than nasopharyngeal swab, presence of viral mutation(s) within the areas targeted by this assay, and inadequate number of viral copies(<138 copies/mL). A negative result must be combined with clinical observations, patient history, and epidemiological information. The expected result is Negative.  Fact Sheet for Patients:  BloggerCourse.com  Fact Sheet for Healthcare Providers:  SeriousBroker.it  This test is no                          t yet approved or cleared by the [place] FDA and  has been authorized for detection and/or diagnosis of SARS-CoV-2 by FDA under an Emergency Use Authorization (EUA). This EUA will remain  in effect (meaning this test can be used) for the duration of the COVID-19 declaration under Section 564(b)(1) of the Act, 21 U.S.C.section 360bbb-3(b)(1), unless the authorization is terminated  or revoked sooner.       Influenza A by PCR 06/07/2022 NEGATIVE  NEGATIVE Final   Influenza B by PCR 06/07/2022 NEGATIVE  NEGATIVE Final   Comment: (NOTE) The Xpert Xpress SARS-CoV-2/FLU/RSV plus assay is intended as an aid in the diagnosis of influenza from Nasopharyngeal swab specimens and should not be used as a sole basis for treatment. Nasal washings and aspirates are unacceptable for Xpert Xpress SARS-CoV-2/FLU/RSV testing.  Fact Sheet for Patients: BloggerCourse.com  Fact Sheet for Healthcare Providers: SeriousBroker.it  This test is not yet approved or cleared by the [place] FDA and has been authorized for detection and/or diagnosis of SARS-CoV-2 by FDA under an Emergency Use Authorization (EUA). This EUA will remain in effect (meaning this test can be used) for the duration of the COVID-19 declaration under Section 564(b)(1) of the Act, 21 U.S.C. section 360bbb-3(b)(1), unless the authorization is terminated  or revoked.  Performed at [facility], [address] [place], [place] [address]    WBC 06/07/2022 5.7  4.0 - 10.5 K/uL Final   RBC 06/07/2022 4.39  3.87 - 5.11 MIL/uL Final   Hemoglobin 06/07/2022 13.2  12.0 - 15.0 g/dL Final   HCT 60/45/4098 40.4  36.0 - 46.0 % Final   MCV 06/07/2022 92.0  80.0 - 100.0 fL Final   MCH 06/07/2022 30.1  26.0 - 34.0 pg Final   MCHC 06/07/2022 32.7  30.0 - 36.0 g/dL Final   RDW 11/91/4782 12.4  11.5 - 15.5 % Final   Platelets 06/07/2022 308  150 - 400 K/uL Final   nRBC 06/07/2022 0.0  0.0 - 0.2 % Final   Neutrophils Relative % 06/07/2022 42  % Final   Neutro Abs 06/07/2022 2.4  1.7 - 7.7 K/uL Final   Lymphocytes Relative 06/07/2022 54  % Final   Lymphs Abs 06/07/2022 3.1  0.7 - 4.0 K/uL Final   Monocytes Relative 06/07/2022 4  % Final   Monocytes Absolute 06/07/2022 0.2  0.1 - 1.0 K/uL Final   Eosinophils Relative 06/07/2022 0  % Final   Eosinophils Absolute 06/07/2022 0.0  0.0 - 0.5 K/uL Final   Basophils Relative 06/07/2022 0  % Final   Basophils Absolute 06/07/2022 0.0  0.0 - 0.1 K/uL Final   Immature Granulocytes 06/07/2022 0  % Final   Abs Immature Granulocytes 06/07/2022 0.01  0.00 - 0.07 K/uL Final   Performed at [facility], [address], [place], [place] [address]   Sodium 06/07/2022 139  135 - 145 mmol/L Final   Potassium 06/07/2022 4.0  3.5 - 5.1 mmol/L Final   Chloride 06/07/2022 104  98 - 111 mmol/L Final   CO2 06/07/2022 28  22 - 32 mmol/L Final   Glucose, Bld 06/07/2022 104 (H)  70 - 99 mg/dL Final   Glucose reference range applies only to samples taken  after fasting for at least 8 hours.   BUN 06/07/2022 8  6 - 20 mg/dL Final   Creatinine, Ser 06/07/2022 0.84  0.44 - 1.00 mg/dL Final   Calcium 82/42/3536 9.1  8.9 - 10.3 mg/dL Final   Total Protein 14/43/1540 6.9  6.5 - 8.1 g/dL Final   Albumin 08/67/6195 3.7  3.5 - 5.0 g/dL Final   AST 09/32/6712 19  15 - 41 U/L Final   ALT 06/07/2022 22  0 - 44 U/L Final    Alkaline Phosphatase 06/07/2022 54  38 - 126 U/L Final   Total Bilirubin 06/07/2022 0.4  0.3 - 1.2 mg/dL Final   GFR, Estimated 06/07/2022 >60  >60 mL/min Final   Comment: (NOTE) Calculated using the CKD-EPI Creatinine Equation (2021)    Anion gap 06/07/2022 7  5 - 15 Final   Performed at [facility], [address] [place], [place] [address]   Hgb A1c MFr Bld 06/07/2022 5.0  4.8 - 5.6 % Final   Comment: (NOTE) Pre diabetes:          5.7%-6.4%  Diabetes:              >6.4%  Glycemic control for   <7.0% adults with diabetes    Mean Plasma Glucose 06/07/2022 96.8  mg/dL Final   Performed at [facility], [address] [place], [place] [address]   TSH 06/07/2022 1.620  0.350 - 4.500 uIU/mL Final   Comment: Performed by a 3rd Generation assay with a functional sensitivity of <=0.01 uIU/mL. Performed at [facility], [address] [place], [place] [address]    RPR Ser Ql 06/07/2022 NON REACTIVE  NON REACTIVE Final   Performed at [facility], [address] [place], [place] [address]   Color, Urine 06/07/2022 YELLOW  YELLOW Final   APPearance 06/07/2022 HAZY (A)  CLEAR Final   Specific Gravity, Urine 06/07/2022 1.018  1.005 - 1.030 Final   pH 06/07/2022 7.0  5.0 - 8.0 Final   Glucose, UA 06/07/2022 NEGATIVE  NEGATIVE mg/dL Final   Hgb urine dipstick 06/07/2022 NEGATIVE  NEGATIVE Final   Bilirubin Urine 06/07/2022 NEGATIVE  NEGATIVE Final   Ketones, ur 06/07/2022 NEGATIVE  NEGATIVE mg/dL Final   Protein, ur 41/93/7902 NEGATIVE  NEGATIVE mg/dL Final   Nitrite 40/97/3532 NEGATIVE  NEGATIVE Final   Leukocytes,Ua 06/07/2022 NEGATIVE  NEGATIVE Final   Performed at [facility], [address], [place], [place] [address]   Cholesterol 06/07/2022 164  0 - 200 mg/dL Final   Triglycerides 68/34/1962 158 (H)  <150 mg/dL Final   HDL 22/97/9892 44  >40 mg/dL Final   Total CHOL/HDL Ratio 06/07/2022 3.7  RATIO Final   VLDL 06/07/2022 32  0 - 40 mg/dL Final    LDL Cholesterol 06/07/2022 88  0 - 99 mg/dL Final   Comment:        Total Cholesterol/HDL:CHD Risk Coronary Heart Disease Risk Table                     Men   Women  1/2 Average Risk   3.4   3.3  Average Risk       5.0   4.4  2 X Average Risk   9.6   7.1  3 X Average Risk  23.4   11.0        Use the calculated Patient Ratio above and the CHD Risk Table to determine the  patient's CHD Risk.        ATP III CLASSIFICATION (LDL):  <100     mg/dL   Optimal  409-811  mg/dL   Near or Above                    Optimal  130-159  mg/dL   Borderline  914-782  mg/dL   High  >956     mg/dL   Very High Performed at [facility], [address], [place], [place] [address]    HIV Screen 4th Generation wRfx 06/07/2022 Non Reactive  Non Reactive Final   Performed at [facility], [address] [place], [place] [address]   SARSCOV2ONAVIRUS 2 AG 06/07/2022 NEGATIVE  NEGATIVE Final   Comment: (NOTE) SARS-CoV-2 antigen NOT DETECTED.   Negative results are presumptive.  Negative results do not preclude SARS-CoV-2 infection and should not be used as the sole basis for treatment or other patient management decisions, including infection  control decisions, particularly in the presence of clinical signs and  symptoms consistent with COVID-19, or in those who have been in contact with the virus.  Negative results must be combined with clinical observations, patient history, and epidemiological information. The expected result is Negative.  Fact Sheet for Patients: https://www.jennings-kim.com/  Fact Sheet for Healthcare Providers: https://alexander-rogers.biz/  This test is not yet approved or cleared by the [place] FDA and  has been authorized for detection and/or diagnosis of SARS-CoV-2 by FDA under an Emergency Use Authorization (EUA).  This EUA will remain in effect (meaning this test can be used) for the duration of  the COV                           ID-19 declaration under Section 564(b)(1) of the Act, 21 U.S.C. section 360bbb-3(b)(1), unless the authorization is terminated or revoked sooner.     POC Amphetamine UR 06/07/2022 None Detected  NONE DETECTED (Cut Off Level 1000 ng/mL) Final   POC Secobarbital (BAR) 06/07/2022 None Detected  NONE DETECTED (Cut Off Level 300 ng/mL) Final   POC Buprenorphine (BUP) 06/07/2022 None Detected  NONE DETECTED (Cut Off Level 10 ng/mL) Final   POC Oxazepam (BZO) 06/07/2022 None Detected  NONE DETECTED (Cut Off Level 300 ng/mL) Final   POC Cocaine UR 06/07/2022 None Detected  NONE DETECTED (Cut Off Level 300 ng/mL) Final   POC Methamphetamine UR 06/07/2022 None Detected  NONE DETECTED (Cut Off Level 1000 ng/mL) Final   POC Morphine 06/07/2022 None Detected  NONE DETECTED (Cut Off Level 300 ng/mL) Final   POC Methadone UR 06/07/2022 None Detected  NONE DETECTED (Cut Off Level 300 ng/mL) Final   POC Oxycodone UR 06/07/2022 None Detected  NONE DETECTED (Cut Off Level 100 ng/mL) Final   POC Marijuana UR 06/07/2022 None Detected  NONE DETECTED (Cut Off Level 50 ng/mL) Final   Preg Test, Ur 06/08/2022 NEGATIVE  NEGATIVE Final   Comment:        THE SENSITIVITY OF THIS METHODOLOGY IS >20 mIU/mL. Performed at [facility], [address], [place], [place] [address]    Preg Test, Ur 06/08/2022 NEGATIVE  NEGATIVE Final   Comment:        THE SENSITIVITY OF THIS METHODOLOGY IS >24 mIU/mL     Blood Alcohol level:  Lab Results  Component Value Date   ETH <10 07/04/2022   ETH <10 02/07/2022    Metabolic Disorder Labs:  Lab Results  Component Value Date   HGBA1C 5.0 06/07/2022   MPG 96.8 06/07/2022   MPG 96.8 02/07/2022   No results found for: "PROLACTIN" Lab Results  Component Value Date   CHOL 181 07/25/2022   TRIG 118 07/25/2022   HDL 45 07/25/2022   CHOLHDL 4.0 07/25/2022   VLDL 24 07/25/2022   LDLCALC 112 (H) 07/25/2022   LDLCALC 88 06/07/2022    Therapeutic Lab Levels: No  results found for: "LITHIUM" No results found for: "VALPROATE" No results found for: "CBMZ"  Physical Findings   GAD-7    Flowsheet Row Office Visit from 12/17/2021 in [facility]  Total GAD-7 Score 17      PHQ2-9    Flowsheet Row ED from 06/07/2022 in [facility] Office Visit from 12/17/2021 in [facility]  PHQ-2 Total Score 1 2  PHQ-9 Total Score 2 8      Flowsheet Row ED from 07/25/2022 in [facility] ED from 07/04/2022 in [facility] from 06/14/2022 in [facility]   C-SSRS RISK CATEGORY High Risk No Risk No Risk        Musculoskeletal  Strength & Muscle Tone: within normal limits Gait & Station: normal Patient leans: N/A  Psychiatric Specialty Exam  Presentation  General Appearance:  Appropriate for Environment  Eye Contact: Good  Speech: Clear and Coherent; Normal Rate  Speech Volume: Normal  Handedness: Right   Mood and Affect  Mood: Euthymic  Affect: Appropriate; Congruent   Thought Process  Thought Processes: Coherent; Goal Directed  Descriptions of Associations:Intact  Orientation:Full (Time, Place and Person)  Thought Content:Logical  Diagnosis of Schizophrenia or Schizoaffective disorder in past: No    Hallucinations:Hallucinations: None  Ideas of Reference:None  Suicidal Thoughts:Suicidal Thoughts: No  Homicidal Thoughts:Homicidal Thoughts: No   Sensorium  Memory: Immediate Good; Recent Good  Judgment: Intact  Insight: Present   Executive Functions  Concentration: Fair  Attention Span: Fair  Recall: [employer] of Knowledge: Fair  Language: Fair   Psychomotor Activity  Psychomotor Activity: Psychomotor Activity: Normal   Assets  Assets: Communication Skills; Desire for Improvement   Sleep  Sleep: Sleep:  Good   Nutritional Assessment (For OBS and [facility] admissions only) Has the patient had a weight loss or gain of 10 pounds or more in the last 3 months?: No Has the patient had a decrease in food intake/or appetite?: No Does the patient have dental problems?: No Does the patient have eating habits or behaviors that may be indicators of an eating disorder including binging or inducing vomiting?: No Has the patient recently lost weight without trying?: 0 Has the patient been eating poorly because of a decreased appetite?: 0 Malnutrition Screening Tool Score: 0   Physical Exam  Physical Exam Vitals and nursing note reviewed.  Constitutional:      Appearance: Normal appearance. She is well-developed.  Cardiovascular:     Rate and Rhythm: Normal rate.  Pulmonary:     Effort: Pulmonary effort is normal.  Musculoskeletal:        General: Normal range of motion.     Cervical back: Normal range of motion.  Skin:    General: Skin is warm and dry.  Neurological:     Mental Status: She is alert and oriented to person, place, and time.  Psychiatric:        Attention and Perception:  Attention and perception normal.        Mood and Affect: Mood and affect normal.        Speech: Speech normal.        Behavior: Behavior normal.        Thought Content: Thought content normal.        Cognition and Memory: Cognition normal.    Review of Systems  Constitutional: Negative.   HENT: Negative.    Eyes: Negative.   Respiratory: Negative.    Cardiovascular: Negative.   Gastrointestinal:  Positive for constipation (Day 5 with no bowel movement). Abdominal pain: discomfort.      Reports most recent BM 4 days ago  Genitourinary: Negative.   Musculoskeletal: Negative.   Skin: Negative.   Neurological: Negative.    Blood pressure 130/78, pulse 98, temperature 97.7 F (36.5 C), temperature source Oral, resp. rate 18, SpO2 100 %. There is no height or weight on file to calculate BMI.  Treatment Plan  Summary: Daily contact with patient to assess and evaluate symptoms and progress in treatment Patient remains cleared by psychiatry.  Disposition is pending placement, transition of care staff pursuing disposition plan.  Jaloni Sorber, NP 09/01/2022 9:58 AM

## 2022-09-01 NOTE — ED Notes (Signed)
Pt awake, no c/o pain, no noted resp distress. Will continue to monitor for safety

## 2022-09-01 NOTE — ED Notes (Signed)
Pt awake. A/o x 4 . Denies SI/HI/AVH. States she feels better since having a BM today. She is engaged in conversation, calm, cooperative. No noted resp distress. Will continue to monitor.

## 2022-09-01 NOTE — ED Notes (Signed)
Pt given fresh linen, towels, washcloth and body wash. Linen changed and pt showered.

## 2022-09-01 NOTE — ED Notes (Signed)
Pt presents with blunted affect, mood depressed. Nicole [name] continues to report constipation issues, having received multiple prn medications with no effect. Writer encouraged ambulation, po hydration and discussed with NP. Pt did request cereal for breakfast this am and eating and drinking without issues noted. Pt denies any SI or HI or A.V Hallucinations. Able to make her needs known. Pt is safe, will con't to monitor.

## 2022-09-02 NOTE — ED Notes (Signed)
Pt sleeping in recliner bed. No noted resp distress. Will continue to monitor for safety 

## 2022-09-02 NOTE — ED Notes (Signed)
Patient is very angry that she cannot have her personal blanket (provider was asked and said not at this time) - provider made aware of patient's demands to see her

## 2022-09-02 NOTE — ED Provider Notes (Signed)
Behavioral Health Progress Note  Date and Time: 09/02/2022 9:56 AM Name: Nicole [name] MRN:  [id number]  Subjective:  Nicole [name] is a 31 y.o. female, with PMH of SCZ unspecified, MDD with psychosis, who presented voluntary to [facility] (07/25/2022) via [facility] after a verbal altercation with Nicole [name] (not patient's legal guardian) for transient SI.  This is patient's fourth visit to [facility] for similar concerns this year. Patient has been dismissed from previous group home due to threatening SI and HI, then leaving the group home.   Legal Guardian: Mom Nicole [name]) transitioning to be Dad Nicole [name]) Point of contact: Dad Nicole [name])   Nicole [name], 86 y.o., female patient seen face to face by this provider for psychiatric reassessment, consulted with Dr. Nelly Rout; and chart reviewed on 09/02/22.  On evaluation Nicole [name] reports she is feeling better since she has had several bowel movements.  Continues to sleep without difficulty but complains of being cold and wanting her personal blanket.  Discussed options of getting warm, but informed could not have personal blanket and that all of the clothing around bed needed to be put in locker.  Understanding voiced.   Reports continue to take medications as prescribed with out adverse reaction and continues to deny suicidal/self-harm/homicidal ideation, psychosis, and paranoia.     During evaluation Nicole [name] is sitting up in bed with no noted distress.  He is alert, oriented x 4, calm, cooperative and attentive.  Her mood is euthymic with congruent affect.  She has normal speech, and behavior.  Objectively there is no evidence of psychosis/mania or delusional thinking.  Patient is able to converse coherently, goal directed thoughts, no distractibility, or pre-occupation.  She also denies suicidal/self-harm/homicidal ideation, psychosis, and paranoia.  Patient answered question appropriately.    Diagnosis:  Final diagnoses:   Severe episode of recurrent major depressive disorder, with psychotic features (HCC)    Total Time spent with patient: 20 minutes  Past Psychiatric History: above  Past Medical History:  Past Medical History:  Diagnosis Date   Anxiety    Depression    Hypothyroidism 08/07/2022   Tobacco use disorder 08/07/2022    Past Surgical History:  Procedure Laterality Date   WISDOM TOOTH EXTRACTION Bilateral 2020   Family History:  Family History  Problem Relation Age of Onset   Hypertension Father    Diabetes Father    Family Psychiatric  History: see above Social History:  Social History   Substance and Sexual Activity  Alcohol Use Never     Social History   Substance and Sexual Activity  Drug Use Never    Social History   Socioeconomic History   Marital status: Single    Spouse name: Not on file   Number of children: Not on file   Years of education: Not on file   Highest education level: Not on file  Occupational History   Not on file  Tobacco Use   Smoking status: Every Day    Types: Cigarettes   Smokeless tobacco: Never  Vaping Use   Vaping Use: Some days  Substance and Sexual Activity   Alcohol use: Never   Drug use: Never   Sexual activity: Yes    Partners: Female    Birth control/protection: Implant  Other Topics Concern   Not on file  Social History Narrative   Not on file   Social Determinants of Health   Financial Resource Strain: Not on file  Food Insecurity: Not on file  Transportation Needs:  Not on file  Physical Activity: Not on file  Stress: Not on file  Social Connections: Not on file   SDOH:  SDOH Screenings   Depression (PHQ2-9): Low Risk  (06/10/2022)  Tobacco Use: High Risk (09/01/2022)   Additional Social History:    Pain Medications: SEE MAR Prescriptions: SEE MAR Over the Counter: SEE MAR History of alcohol / drug use?: Yes Longest period of sobriety (when/how long): N/A Negative Consequences of Use:   (NONE) Withdrawal Symptoms: None Name of Substance 1: ETOH IN PAST--PT CURRENTLY DENIES 1 - Frequency: SOCIAL USE ETOH IN PAST 1 - Method of Aquiring: LEGAL 1- Route of Use: ORAL DRINK Name of Substance 2: NICOTINE-VAPE AND CIGARETTES 2 - Amount (size/oz): LESS THAN ONE PACK 2 - Frequency: DAILY 2 - Method of Aquiring: LEGAL 2 - Route of Substance Use: ORAL SMOKE    Sleep: Good  Appetite:  Fair  Current Medications:  Current Facility-Administered Medications  Medication Dose Route Frequency Provider Last Rate Last Admin   acetaminophen (TYLENOL) tablet 650 mg  650 mg Oral Q6H PRN Onuoha, Chinwendu V, NP   650 mg at 08/30/22 1856   alum & mag hydroxide-simeth (MAALOX/MYLANTA) 200-200-20 MG/5ML suspension 30 mL  30 mL Oral Q4H PRN Onuoha, Chinwendu V, NP   30 mL at 07/27/22 2151   ARIPiprazole ER (ABILIFY MAINTENA) injection 400 mg  400 mg Intramuscular Q28 days Princess Bruins, DO   400 mg at 08/30/22 1338   clotrimazole (LOTRIMIN) 1 % cream   Topical BID Bobbitt, Shalon E, NP   1 Application at 08/28/22 2130   hydrOXYzine (ATARAX) tablet 25 mg  25 mg Oral TID PRN Onuoha, Chinwendu V, NP   25 mg at 09/02/22 0128   ibuprofen (ADVIL) tablet 400 mg  400 mg Oral Q6H PRN Princess Bruins, DO   400 mg at 08/30/22 2123   levothyroxine (SYNTHROID) tablet 100 mcg  100 mcg Oral Q0600 Princess Bruins, DO   100 mcg at 09/02/22 0551   ziprasidone (GEODON) injection 20 mg  20 mg Intramuscular Q12H PRN Onuoha, Chinwendu V, NP       And   LORazepam (ATIVAN) tablet 1 mg  1 mg Oral PRN Onuoha, Chinwendu V, NP       nicotine (NICODERM CQ - dosed in mg/24 hours) patch 14 mg  14 mg Transdermal Q0600 Onuoha, Chinwendu V, NP   14 mg at 09/02/22 0916   ondansetron (ZOFRAN-ODT) disintegrating tablet 4 mg  4 mg Oral Q8H PRN Sindy Guadeloupe, NP   4 mg at 08/30/22 2122   Oxcarbazepine (TRILEPTAL) tablet 300 mg  300 mg Oral BID Onuoha, Chinwendu V, NP   300 mg at 09/02/22 0916   pantoprazole (PROTONIX) EC tablet 20 mg  20  mg Oral Daily Princess Bruins, DO   20 mg at 09/02/22 0916   polyethylene glycol (MIRALAX / GLYCOLAX) packet 17 g  17 g Oral Daily PRN Lenard Lance, FNP   17 g at 08/31/22 2205   QUEtiapine (SEROQUEL) tablet 400 mg  400 mg Oral BID Onuoha, Chinwendu V, NP   400 mg at 09/02/22 0916   senna-docusate (Senokot-S) tablet 1 tablet  1 tablet Oral QHS Princess Bruins, DO   1 tablet at 09/01/22 2134   sertraline (ZOLOFT) tablet 150 mg  150 mg Oral Daily Onuoha, Chinwendu V, NP   150 mg at 09/02/22 0916   sodium phosphate (FLEET) 7-19 GM/118ML enema 1 enema  1 enema Rectal Once Denese Mentink B, NP  traZODone (DESYREL) tablet 50 mg  50 mg Oral QHS PRN Onuoha, Chinwendu V, NP   50 mg at 09/01/22 2135   valACYclovir (VALTREX) tablet 500 mg  500 mg Oral Daily Princess Bruins, DO   500 mg at 09/02/22 1610   Current Outpatient Medications  Medication Sig Dispense Refill   ABILIFY MAINTENA 400 MG PRSY prefilled syringe 400 mg every 28 (twenty-eight) days.     cetirizine (ZYRTEC) 10 MG tablet Take 10 mg by mouth daily.     cyclobenzaprine (FLEXERIL) 10 MG tablet Take 1 tablet (10 mg total) by mouth 2 (two) times daily as needed for muscle spasms. 20 tablet 0   fluticasone (FLONASE) 50 MCG/ACT nasal spray Place 1 spray into both nostrils daily.     meloxicam (MOBIC) 15 MG tablet Take 15 mg by mouth daily.     Oxcarbazepine (TRILEPTAL) 300 MG tablet Take 1 tablet (300 mg total) by mouth 2 (two) times daily. 60 tablet 0   QUEtiapine (SEROQUEL) 400 MG tablet Take 1 tablet (400 mg total) by mouth 2 (two) times daily. 60 tablet 0   sertraline (ZOLOFT) 50 MG tablet Take 3 tablets (150 mg total) by mouth in the morning. 90 tablet 0   traZODone (DESYREL) 100 MG tablet Take 1 tablet (100 mg total) by mouth at bedtime. 30 tablet 0   valACYclovir (VALTREX) 500 MG tablet Take 500 mg by mouth daily.      Labs  Lab Results:  Admission on 07/25/2022  Component Date Value Ref Range Status   SARS Coronavirus 2 by RT PCR  07/25/2022 NEGATIVE  NEGATIVE Final   Comment: (NOTE) SARS-CoV-2 target nucleic acids are NOT DETECTED.  The SARS-CoV-2 RNA is generally detectable in upper respiratory specimens during the acute phase of infection. The lowest concentration of SARS-CoV-2 viral copies this assay can detect is 138 copies/mL. A negative result does not preclude SARS-Cov-2 infection and should not be used as the sole basis for treatment or other patient management decisions. A negative result may occur with  improper specimen collection/handling, submission of specimen other than nasopharyngeal swab, presence of viral mutation(s) within the areas targeted by this assay, and inadequate number of viral copies(<138 copies/mL). A negative result must be combined with clinical observations, patient history, and epidemiological information. The expected result is Negative.  Fact Sheet for Patients:  BloggerCourse.com  Fact Sheet for Healthcare Providers:  SeriousBroker.it  This test is no                          t yet approved or cleared by the [place] FDA and  has been authorized for detection and/or diagnosis of SARS-CoV-2 by FDA under an Emergency Use Authorization (EUA). This EUA will remain  in effect (meaning this test can be used) for the duration of the COVID-19 declaration under Section 564(b)(1) of the Act, 21 U.S.C.section 360bbb-3(b)(1), unless the authorization is terminated  or revoked sooner.       Influenza A by PCR 07/25/2022 NEGATIVE  NEGATIVE Final   Influenza B by PCR 07/25/2022 NEGATIVE  NEGATIVE Final   Comment: (NOTE) The Xpert Xpress SARS-CoV-2/FLU/RSV plus assay is intended as an aid in the diagnosis of influenza from Nasopharyngeal swab specimens and should not be used as a sole basis for treatment. Nasal washings and aspirates are unacceptable for Xpert Xpress SARS-CoV-2/FLU/RSV testing.  Fact Sheet for  Patients: BloggerCourse.com  Fact Sheet for Healthcare Providers: SeriousBroker.it  This test is not  yet approved or cleared by the [place] and has been authorized for detection and/or diagnosis of SARS-CoV-2 by FDA under an Emergency Use Authorization (EUA). This EUA will remain in effect (meaning this test can be used) for the duration of the COVID-19 declaration under Section 564(b)(1) of the Act, 21 U.S.C. section 360bbb-3(b)(1), unless the authorization is terminated or revoked.  Performed at [facility], [address], [place], [place] [address]    WBC 07/25/2022 8.3  4.0 - 10.5 K/uL Final   RBC 07/25/2022 4.44  3.87 - 5.11 MIL/uL Final   Hemoglobin 07/25/2022 13.7  12.0 - 15.0 g/dL Final   HCT 99/35/7017 40.2  36.0 - 46.0 % Final   MCV 07/25/2022 90.5  80.0 - 100.0 fL Final   MCH 07/25/2022 30.9  26.0 - 34.0 pg Final   MCHC 07/25/2022 34.1  30.0 - 36.0 g/dL Final   RDW 79/39/0300 12.2  11.5 - 15.5 % Final   Platelets 07/25/2022 248  150 - 400 K/uL Final   nRBC 07/25/2022 0.0  0.0 - 0.2 % Final   Neutrophils Relative % 07/25/2022 43  % Final   Neutro Abs 07/25/2022 3.6  1.7 - 7.7 K/uL Final   Lymphocytes Relative 07/25/2022 52  % Final   Lymphs Abs 07/25/2022 4.2 (H)  0.7 - 4.0 K/uL Final   Monocytes Relative 07/25/2022 5  % Final   Monocytes Absolute 07/25/2022 0.4  0.1 - 1.0 K/uL Final   Eosinophils Relative 07/25/2022 0  % Final   Eosinophils Absolute 07/25/2022 0.0  0.0 - 0.5 K/uL Final   Basophils Relative 07/25/2022 0  % Final   Basophils Absolute 07/25/2022 0.0  0.0 - 0.1 K/uL Final   Immature Granulocytes 07/25/2022 0  % Final   Abs Immature Granulocytes 07/25/2022 0.02  0.00 - 0.07 K/uL Final   Performed at [facility], [address] [place], [place] [address]   Sodium 07/25/2022 138  135 - 145 mmol/L Final   Potassium 07/25/2022 4.0  3.5 - 5.1 mmol/L Final   Chloride 07/25/2022 104   98 - 111 mmol/L Final   CO2 07/25/2022 29  22 - 32 mmol/L Final   Glucose, Bld 07/25/2022 83  70 - 99 mg/dL Final   Glucose reference range applies only to samples taken after fasting for at least 8 hours.   BUN 07/25/2022 11  6 - 20 mg/dL Final   Creatinine, Ser 07/25/2022 0.97  0.44 - 1.00 mg/dL Final   Calcium 07/62/2633 9.2  8.9 - 10.3 mg/dL Final   Total Protein 35/45/6256 7.0  6.5 - 8.1 g/dL Final   Albumin 38/93/7342 3.8  3.5 - 5.0 g/dL Final   AST 87/68/1157 18  15 - 41 U/L Final   ALT 07/25/2022 22  0 - 44 U/L Final   Alkaline Phosphatase 07/25/2022 64  38 - 126 U/L Final   Total Bilirubin 07/25/2022 0.2 (L)  0.3 - 1.2 mg/dL Final   GFR, Estimated 07/25/2022 >60  >60 mL/min Final   Comment: (NOTE) Calculated using the CKD-EPI Creatinine Equation (2021)    Anion gap 07/25/2022 5  5 - 15 Final   Performed at [facility], [address], [place], [place] [address]   POC Amphetamine UR 07/25/2022 None Detected  NONE DETECTED (Cut Off Level 1000 ng/mL) Preliminary   POC Secobarbital (BAR) 07/25/2022 None Detected  NONE DETECTED (Cut Off Level 300 ng/mL) Preliminary   POC Buprenorphine (BUP) 07/25/2022 None Detected  NONE DETECTED (Cut Off Level 10 ng/mL) Preliminary   POC Oxazepam (BZO) 07/25/2022 None Detected  NONE DETECTED (Cut Off Level 300 ng/mL) Preliminary   POC Cocaine UR 07/25/2022 None Detected  NONE DETECTED (Cut Off Level 300 ng/mL) Preliminary   POC Methamphetamine UR 07/25/2022 None Detected  NONE DETECTED (Cut Off Level 1000 ng/mL) Preliminary   POC Morphine 07/25/2022 None Detected  NONE DETECTED (Cut Off Level 300 ng/mL) Preliminary   POC Methadone UR 07/25/2022 None Detected  NONE DETECTED (Cut Off Level 300 ng/mL) Preliminary   POC Oxycodone UR 07/25/2022 None Detected  NONE DETECTED (Cut Off Level 100 ng/mL) Preliminary   POC Marijuana UR 07/25/2022 None Detected  NONE DETECTED (Cut Off Level 50 ng/mL) Preliminary   SARSCOV2ONAVIRUS 2 AG 07/25/2022 NEGATIVE   NEGATIVE Final   Comment: (NOTE) SARS-CoV-2 antigen NOT DETECTED.   Negative results are presumptive.  Negative results do not preclude SARS-CoV-2 infection and should not be used as the sole basis for treatment or other patient management decisions, including infection  control decisions, particularly in the presence of clinical signs and  symptoms consistent with COVID-19, or in those who have been in contact with the virus.  Negative results must be combined with clinical observations, patient history, and epidemiological information. The expected result is Negative.  Fact Sheet for Patients: https://www.jennings-kim.com/  Fact Sheet for Healthcare Providers: https://alexander-rogers.biz/  This test is not yet approved or cleared by the [place] FDA and  has been authorized for detection and/or diagnosis of SARS-CoV-2 by FDA under an Emergency Use Authorization (EUA).  This EUA will remain in effect (meaning this test can be used) for the duration of  the COV                          ID-19 declaration under Section 564(b)(1) of the Act, 21 U.S.C. section 360bbb-3(b)(1), unless the authorization is terminated or revoked sooner.     Cholesterol 07/25/2022 181  0 - 200 mg/dL Final   Triglycerides 08/10/2535 118  <150 mg/dL Final   HDL 64/40/3474 45  >40 mg/dL Final   Total CHOL/HDL Ratio 07/25/2022 4.0  RATIO Final   VLDL 07/25/2022 24  0 - 40 mg/dL Final   LDL Cholesterol 07/25/2022 112 (H)  0 - 99 mg/dL Final   Comment:        Total Cholesterol/HDL:CHD Risk Coronary Heart Disease Risk Table                     Men   Women  1/2 Average Risk   3.4   3.3  Average Risk       5.0   4.4  2 X Average Risk   9.6   7.1  3 X Average Risk  23.4   11.0        Use the calculated Patient Ratio above and the CHD Risk Table to determine the patient's CHD Risk.        ATP III CLASSIFICATION (LDL):  <100     mg/dL   Optimal  259-563  mg/dL   Near or  Above                    Optimal  130-159  mg/dL   Borderline  875-643  mg/dL   High  >329     mg/dL   Very High Performed at [facility], [address], [place], [place] [address]  TSH 07/25/2022 6.668 (H)  0.350 - 4.500 uIU/mL Final   Comment: Performed by a 3rd Generation assay with a functional sensitivity of <=0.01 uIU/mL. Performed at [facility], [address] [place], [place] [address]    Glucose-Capillary 07/26/2022 104 (H)  70 - 99 mg/dL Final   Glucose reference range applies only to samples taken after fasting for at least 8 hours.   T3, Free 07/31/2022 2.3  2.0 - 4.4 pg/mL Final   Comment: (NOTE) Performed At: [facility] [address] [place], [place] [id number] Jolene Schimke MD AO:[id number]    Free T4 07/31/2022 0.60 (L)  0.61 - 1.12 ng/dL Final   Comment: (NOTE) Biotin ingestion may interfere with free T4 tests. If the results are inconsistent with the TSH level, previous test results, or the clinical presentation, then consider biotin interference. If needed, order repeat testing after stopping biotin. Performed at [facility], [address] [place], [place] [address]    Glucose-Capillary 08/29/2022 100 (H)  70 - 99 mg/dL Final   Glucose reference range applies only to samples taken after fasting for at least 8 hours.  Admission on 07/04/2022, Discharged on 07/05/2022  Component Date Value Ref Range Status   Sodium 07/04/2022 142  135 - 145 mmol/L Final   Potassium 07/04/2022 4.4  3.5 - 5.1 mmol/L Final   Chloride 07/04/2022 109  98 - 111 mmol/L Final   CO2 07/04/2022 26  22 - 32 mmol/L Final   Glucose, Bld 07/04/2022 96  70 - 99 mg/dL Final   Glucose reference range applies only to samples taken after fasting for at least 8 hours.   BUN 07/04/2022 15  6 - 20 mg/dL Final   Creatinine, Ser 07/04/2022 0.83  0.44 - 1.00 mg/dL Final   Calcium 28/41/3244 9.3  8.9 - 10.3 mg/dL Final   Total Protein 10/16/7251 7.2  6.5 - 8.1  g/dL Final   Albumin 66/44/0347 3.9  3.5 - 5.0 g/dL Final   AST 42/59/5638 23  15 - 41 U/L Final   ALT 07/04/2022 28  0 - 44 U/L Final   Alkaline Phosphatase 07/04/2022 77  38 - 126 U/L Final   Total Bilirubin 07/04/2022 0.4  0.3 - 1.2 mg/dL Final   GFR, Estimated 07/04/2022 >60  >60 mL/min Final   Comment: (NOTE) Calculated using the CKD-EPI Creatinine Equation (2021)    Anion gap 07/04/2022 7  5 - 15 Final   Performed at [facility], [address] [place], [place] [address]   Alcohol, Ethyl (B) 07/04/2022 <10  <10 mg/dL Final   Comment: (NOTE) Lowest detectable limit for serum alcohol is 10 mg/dL.  For medical purposes only. Performed at [facility], [address] [place], [place] [address]    WBC 07/04/2022 7.0  4.0 - 10.5 K/uL Final   RBC 07/04/2022 4.18  3.87 - 5.11 MIL/uL Final   Hemoglobin 07/04/2022 12.8  12.0 - 15.0 g/dL Final   HCT 88/41/6606 39.1  36.0 - 46.0 % Final   MCV 07/04/2022 93.5  80.0 - 100.0 fL Final   MCH 07/04/2022 30.6  26.0 - 34.0 pg Final   MCHC 07/04/2022 32.7  30.0 - 36.0 g/dL Final   RDW 30/16/0109 12.9  11.5 - 15.5 % Final   Platelets 07/04/2022 243  150 - 400 K/uL Final   nRBC 07/04/2022 0.0  0.0 - 0.2 % Final   Neutrophils Relative % 07/04/2022 43  % Final  Neutro Abs 07/04/2022 3.0  1.7 - 7.7 K/uL Final   Lymphocytes Relative 07/04/2022 50  % Final   Lymphs Abs 07/04/2022 3.5  0.7 - 4.0 K/uL Final   Monocytes Relative 07/04/2022 7  % Final   Monocytes Absolute 07/04/2022 0.5  0.1 - 1.0 K/uL Final   Eosinophils Relative 07/04/2022 0  % Final   Eosinophils Absolute 07/04/2022 0.0  0.0 - 0.5 K/uL Final   Basophils Relative 07/04/2022 0  % Final   Basophils Absolute 07/04/2022 0.0  0.0 - 0.1 K/uL Final   Immature Granulocytes 07/04/2022 0  % Final   Abs Immature Granulocytes 07/04/2022 0.01  0.00 - 0.07 K/uL Final   Performed at [facility], [address], [place], [place]  [address]   I-stat hCG, quantitative 07/04/2022 <5.0  <5 mIU/mL Final   Comment 3 07/04/2022          Final   Comment:   GEST. AGE      CONC.  (mIU/mL)   <=1 WEEK        5 - 50     2 WEEKS       50 - 500     3 WEEKS       100 - 10,000     4 WEEKS     1,000 - 30,000        FEMALE AND NON-PREGNANT FEMALE:     LESS THAN 5 mIU/mL   Admission on 06/14/2022, Discharged on 06/14/2022  Component Date Value Ref Range Status   Color, UA 06/14/2022 yellow  yellow Final   Clarity, UA 06/14/2022 cloudy (A)  clear Final   Glucose, UA 06/14/2022 negative  negative mg/dL Final   Bilirubin, UA 60/45/4098 negative  negative Final   Ketones, POC UA 06/14/2022 negative  negative mg/dL Final   Spec Grav, UA 11/91/4782 1.025  1.010 - 1.025 Final   Blood, UA 06/14/2022 negative  negative Final   pH, UA 06/14/2022 7.5  5.0 - 8.0 Final   Protein Ur, POC 06/14/2022 =30 (A)  negative mg/dL Final   Urobilinogen, UA 06/14/2022 0.2  0.2 or 1.0 E.U./dL Final   Nitrite, UA 95/62/1308 Negative  Negative Final   Leukocytes, UA 06/14/2022 Negative  Negative Final   Preg Test, Ur 06/14/2022 Negative  Negative Final   Specimen Description 06/14/2022 URINE, CLEAN CATCH   Final   Special Requests 06/14/2022    Final                   Value:NONE Performed at [facility], [address], [place], [place] [address]    Culture 06/14/2022 MULTIPLE SPECIES PRESENT, SUGGEST RECOLLECTION (A)   Final   Report Status 06/14/2022 06/16/2022 FINAL   Final  Admission on 06/07/2022, Discharged on 06/10/2022  Component Date Value Ref Range Status   SARS Coronavirus 2 by RT PCR 06/07/2022 NEGATIVE  NEGATIVE Final   Comment: (NOTE) SARS-CoV-2 target nucleic acids are NOT DETECTED.  The SARS-CoV-2 RNA is generally detectable in upper respiratory specimens during the acute phase of infection. The lowest concentration of SARS-CoV-2 viral copies this assay can detect is 138 copies/mL. A negative result does not preclude  SARS-Cov-2 infection and should not be used as the sole basis for treatment or other patient management decisions. A negative result may occur with  improper specimen collection/handling, submission of specimen other than nasopharyngeal swab, presence of viral mutation(s) within the areas targeted by this assay, and inadequate number of viral  copies(<138 copies/mL). A negative result must be combined with clinical observations, patient history, and epidemiological information. The expected result is Negative.  Fact Sheet for Patients:  BloggerCourse.comhttps://www.fda.gov/media/152166/download  Fact Sheet for Healthcare Providers:  SeriousBroker.ithttps://www.fda.gov/media/152162/download  This test is no                          t yet approved or cleared by the [place] FDA and  has been authorized for detection and/or diagnosis of SARS-CoV-2 by FDA under an Emergency Use Authorization (EUA). This EUA will remain  in effect (meaning this test can be used) for the duration of the COVID-19 declaration under Section 564(b)(1) of the Act, 21 U.S.C.section 360bbb-3(b)(1), unless the authorization is terminated  or revoked sooner.       Influenza A by PCR 06/07/2022 NEGATIVE  NEGATIVE Final   Influenza B by PCR 06/07/2022 NEGATIVE  NEGATIVE Final   Comment: (NOTE) The Xpert Xpress SARS-CoV-2/FLU/RSV plus assay is intended as an aid in the diagnosis of influenza from Nasopharyngeal swab specimens and should not be used as a sole basis for treatment. Nasal washings and aspirates are unacceptable for Xpert Xpress SARS-CoV-2/FLU/RSV testing.  Fact Sheet for Patients: BloggerCourse.comhttps://www.fda.gov/media/152166/download  Fact Sheet for Healthcare Providers: SeriousBroker.ithttps://www.fda.gov/media/152162/download  This test is not yet approved or cleared by the [place] FDA and has been authorized for detection and/or diagnosis of SARS-CoV-2 by FDA under an Emergency Use Authorization (EUA). This EUA will remain in effect  (meaning this test can be used) for the duration of the COVID-19 declaration under Section 564(b)(1) of the Act, 21 U.S.C. section 360bbb-3(b)(1), unless the authorization is terminated or revoked.  Performed at [facility], [address] [place], [place] [id number]    WBC 06/07/2022 5.7  4.0 - 10.5 K/uL Final   RBC 06/07/2022 4.39  3.87 - 5.11 MIL/uL Final   Hemoglobin 06/07/2022 13.2  12.0 - 15.0 g/dL Final   HCT 91/47/829508/25/2023 40.4  36.0 - 46.0 % Final   MCV 06/07/2022 92.0  80.0 - 100.0 fL Final   MCH 06/07/2022 30.1  26.0 - 34.0 pg Final   MCHC 06/07/2022 32.7  30.0 - 36.0 g/dL Final   RDW 62/13/086508/25/2023 12.4  11.5 - 15.5 % Final   Platelets 06/07/2022 308  150 - 400 K/uL Final   nRBC 06/07/2022 0.0  0.0 - 0.2 % Final   Neutrophils Relative % 06/07/2022 42  % Final   Neutro Abs 06/07/2022 2.4  1.7 - 7.7 K/uL Final   Lymphocytes Relative 06/07/2022 54  % Final   Lymphs Abs 06/07/2022 3.1  0.7 - 4.0 K/uL Final   Monocytes Relative 06/07/2022 4  % Final   Monocytes Absolute 06/07/2022 0.2  0.1 - 1.0 K/uL Final   Eosinophils Relative 06/07/2022 0  % Final   Eosinophils Absolute 06/07/2022 0.0  0.0 - 0.5 K/uL Final   Basophils Relative 06/07/2022 0  % Final   Basophils Absolute 06/07/2022 0.0  0.0 - 0.1 K/uL Final   Immature Granulocytes 06/07/2022 0  % Final   Abs Immature Granulocytes 06/07/2022 0.01  0.00 - 0.07 K/uL Final   Performed at [facility], [address] [place], [place] [address]   Sodium 06/07/2022 139  135 - 145 mmol/L Final   Potassium 06/07/2022 4.0  3.5 - 5.1 mmol/L Final   Chloride 06/07/2022 104  98 - 111 mmol/L Final   CO2 06/07/2022 28  22 - 32 mmol/L Final  Glucose, Bld 06/07/2022 104 (H)  70 - 99 mg/dL Final   Glucose reference range applies only to samples taken after fasting for at least 8 hours.   BUN 06/07/2022 8  6 - 20 mg/dL Final   Creatinine, Ser 06/07/2022 0.84  0.44 - 1.00 mg/dL Final   Calcium 16/07/9603 9.1  8.9 - 10.3 mg/dL Final    Total Protein 06/07/2022 6.9  6.5 - 8.1 g/dL Final   Albumin 54/06/8118 3.7  3.5 - 5.0 g/dL Final   AST 14/78/2956 19  15 - 41 U/L Final   ALT 06/07/2022 22  0 - 44 U/L Final   Alkaline Phosphatase 06/07/2022 54  38 - 126 U/L Final   Total Bilirubin 06/07/2022 0.4  0.3 - 1.2 mg/dL Final   GFR, Estimated 06/07/2022 >60  >60 mL/min Final   Comment: (NOTE) Calculated using the CKD-EPI Creatinine Equation (2021)    Anion gap 06/07/2022 7  5 - 15 Final   Performed at [facility], [address] [place], [place] [address]   Hgb A1c MFr Bld 06/07/2022 5.0  4.8 - 5.6 % Final   Comment: (NOTE) Pre diabetes:          5.7%-6.4%  Diabetes:              >6.4%  Glycemic control for   <7.0% adults with diabetes    Mean Plasma Glucose 06/07/2022 96.8  mg/dL Final   Performed at [facility], [address], [place], [place] [address]   TSH 06/07/2022 1.620  0.350 - 4.500 uIU/mL Final   Comment: Performed by a 3rd Generation assay with a functional sensitivity of <=0.01 uIU/mL. Performed at [facility], [address], [place], [place] [address]    RPR Ser Ql 06/07/2022 NON REACTIVE  NON REACTIVE Final   Performed at [facility], [address], [place], [place] [address]   Color, Urine 06/07/2022 YELLOW  YELLOW Final   APPearance 06/07/2022 HAZY (A)  CLEAR Final   Specific Gravity, Urine 06/07/2022 1.018  1.005 - 1.030 Final   pH 06/07/2022 7.0  5.0 - 8.0 Final   Glucose, UA 06/07/2022 NEGATIVE  NEGATIVE mg/dL Final   Hgb urine dipstick 06/07/2022 NEGATIVE  NEGATIVE Final   Bilirubin Urine 06/07/2022 NEGATIVE  NEGATIVE Final   Ketones, ur 06/07/2022 NEGATIVE  NEGATIVE mg/dL Final   Protein, ur 32/44/0102 NEGATIVE  NEGATIVE mg/dL Final   Nitrite 72/53/6644 NEGATIVE  NEGATIVE Final   Leukocytes,Ua 06/07/2022 NEGATIVE  NEGATIVE Final   Performed at [facility], [address] [place], [place] [address]   Cholesterol 06/07/2022 164  0 - 200 mg/dL Final    Triglycerides 06/07/2022 158 (H)  <150 mg/dL Final   HDL 25/95/6387 44  >40 mg/dL Final   Total CHOL/HDL Ratio 06/07/2022 3.7  RATIO Final   VLDL 06/07/2022 32  0 - 40 mg/dL Final   LDL Cholesterol 06/07/2022 88  0 - 99 mg/dL Final   Comment:        Total Cholesterol/HDL:CHD Risk Coronary Heart Disease Risk Table                     Men   Women  1/2 Average Risk   3.4   3.3  Average Risk       5.0   4.4  2 X Average Risk   9.6   7.1  3 X Average Risk  23.4   11.0  Use the calculated Patient Ratio above and the CHD Risk Table to determine the patient's CHD Risk.        ATP III CLASSIFICATION (LDL):  <100     mg/dL   Optimal  130-865  mg/dL   Near or Above                    Optimal  130-159  mg/dL   Borderline  784-696  mg/dL   High  >295     mg/dL   Very High Performed at [facility], [address], [place], [place] [address]    HIV Screen 4th Generation wRfx 06/07/2022 Non Reactive  Non Reactive Final   Performed at [facility], [address], [place], [place] [address]   SARSCOV2ONAVIRUS 2 AG 06/07/2022 NEGATIVE  NEGATIVE Final   Comment: (NOTE) SARS-CoV-2 antigen NOT DETECTED.   Negative results are presumptive.  Negative results do not preclude SARS-CoV-2 infection and should not be used as the sole basis for treatment or other patient management decisions, including infection  control decisions, particularly in the presence of clinical signs and  symptoms consistent with COVID-19, or in those who have been in contact with the virus.  Negative results must be combined with clinical observations, patient history, and epidemiological information. The expected result is Negative.  Fact Sheet for Patients: https://www.jennings-kim.com/  Fact Sheet for Healthcare Providers: https://alexander-rogers.biz/  This test is not yet approved or cleared by the [place] FDA and  has been authorized for detection and/or diagnosis  of SARS-CoV-2 by FDA under an Emergency Use Authorization (EUA).  This EUA will remain in effect (meaning this test can be used) for the duration of  the COV                          ID-19 declaration under Section 564(b)(1) of the Act, 21 U.S.C. section 360bbb-3(b)(1), unless the authorization is terminated or revoked sooner.     POC Amphetamine UR 06/07/2022 None Detected  NONE DETECTED (Cut Off Level 1000 ng/mL) Final   POC Secobarbital (BAR) 06/07/2022 None Detected  NONE DETECTED (Cut Off Level 300 ng/mL) Final   POC Buprenorphine (BUP) 06/07/2022 None Detected  NONE DETECTED (Cut Off Level 10 ng/mL) Final   POC Oxazepam (BZO) 06/07/2022 None Detected  NONE DETECTED (Cut Off Level 300 ng/mL) Final   POC Cocaine UR 06/07/2022 None Detected  NONE DETECTED (Cut Off Level 300 ng/mL) Final   POC Methamphetamine UR 06/07/2022 None Detected  NONE DETECTED (Cut Off Level 1000 ng/mL) Final   POC Morphine 06/07/2022 None Detected  NONE DETECTED (Cut Off Level 300 ng/mL) Final   POC Methadone UR 06/07/2022 None Detected  NONE DETECTED (Cut Off Level 300 ng/mL) Final   POC Oxycodone UR 06/07/2022 None Detected  NONE DETECTED (Cut Off Level 100 ng/mL) Final   POC Marijuana UR 06/07/2022 None Detected  NONE DETECTED (Cut Off Level 50 ng/mL) Final   Preg Test, Ur 06/08/2022 NEGATIVE  NEGATIVE Final   Comment:        THE SENSITIVITY OF THIS METHODOLOGY IS >20 mIU/mL. Performed at [facility], [address] [place], [place] [address]    Preg Test, Ur 06/08/2022 NEGATIVE  NEGATIVE Final   Comment:        THE SENSITIVITY OF THIS METHODOLOGY IS >24 mIU/mL     Blood Alcohol level:  Lab Results  Component Value Date  ETH <10 07/04/2022   ETH <10 02/07/2022    Metabolic Disorder Labs: Lab Results  Component Value Date   HGBA1C 5.0 06/07/2022   MPG 96.8 06/07/2022   MPG 96.8 02/07/2022   No results found for: "PROLACTIN" Lab Results  Component Value Date   CHOL 181 07/25/2022    TRIG 118 07/25/2022   HDL 45 07/25/2022   CHOLHDL 4.0 07/25/2022   VLDL 24 07/25/2022   LDLCALC 112 (H) 07/25/2022   LDLCALC 88 06/07/2022    Therapeutic Lab Levels: No results found for: "LITHIUM" No results found for: "VALPROATE" No results found for: "CBMZ"  Physical Findings   GAD-7    Flowsheet Row [facility]17/[facility]  Total GAD-7 Score 17      PHQ2-9    Flowsheet Row ED from 06/07/2022 in [facility] from 12/17/2021 [facility]  PHQ-2 Total Score 1 2  PHQ-9 Total Score 2 8      Flowsheet Row ED from 07/25/2022 in [facility] ED from 07/04/2022 in [facility] from 06/14/2022 in [facility]   C-SSRS RISK CATEGORY High Risk No Risk No Risk        Musculoskeletal  Strength & Muscle Tone: within normal limits Gait & Station: normal Patient leans: N/A  Psychiatric Specialty Exam  Presentation  General Appearance:  Appropriate for Environment  Eye Contact: Good  Speech: Clear and Coherent; Normal Rate  Speech Volume: Normal  Handedness: Right   Mood and Affect  Mood: Euthymic  Affect: Appropriate; Congruent   Thought Process  Thought Processes: Coherent; Goal Directed  Descriptions of Associations:Intact  Orientation:Full (Time, Place and Person)  Thought Content:Logical  Diagnosis of Schizophrenia or Schizoaffective disorder in past: No    Hallucinations:Hallucinations: None  Ideas of Reference:None  Suicidal Thoughts:Suicidal Thoughts: No  Homicidal Thoughts:Homicidal Thoughts: No   Sensorium  Memory: Immediate Good; Recent Good  Judgment: Intact  Insight: Present   Executive Functions  Concentration: Good  Attention Span: Good  Recall: Fair  Fund of  Knowledge: Fair  Language: Good   Psychomotor Activity  Psychomotor Activity: Psychomotor Activity: Normal   Assets  Assets: Communication Skills; Desire for Improvement   Sleep  Sleep: Sleep: Good   Nutritional Assessment (For OBS and [facility]BC admissions only) Has the patient had a weight loss or gain of 10 pounds or more in the last 3 months?: No Has the patient had a decrease in food intake/or appetite?: No Does the patient have dental problems?: No Does the patient have eating habits or behaviors that may be indicators of an eating disorder including binging or inducing vomiting?: No Has the patient recently lost weight without trying?: 0 Has the patient been eating poorly because of a decreased appetite?: 0 Malnutrition Screening Tool Score: 0   Physical Exam  Physical Exam Vitals and nursing note reviewed.  Constitutional:      Appearance: Normal appearance. She is well-developed.  Cardiovascular:     Rate and Rhythm: Normal rate.  Pulmonary:     Effort: Pulmonary effort is normal.  Musculoskeletal:        General: Normal range of motion.     Cervical back: Normal range of motion.  Skin:    General: Skin is warm and dry.  Neurological:     Mental Status: She is alert and oriented to person, place,  and time.  Psychiatric:        Attention and Perception: Attention and perception normal.        Mood and Affect: Mood and affect normal.        Speech: Speech normal.        Behavior: Behavior normal.        Thought Content: Thought content normal.        Cognition and Memory: Cognition normal.    Review of Systems  Constitutional: Negative.   HENT: Negative.    Eyes: Negative.   Respiratory: Negative.    Cardiovascular: Negative.   Gastrointestinal:  Negative for constipation (several BM yesterday), nausea and vomiting. Abdominal pain: Denies.      Several bowel movements yesterday.  Reports she is feeling better today.    Genitourinary: Negative.    Musculoskeletal: Negative.   Skin: Negative.   Neurological: Negative.   Psychiatric/Behavioral:  Positive for suicidal ideas (Denies). Depression: Stable. Hallucinations: Denies.The patient has insomnia. Nervous/anxious: Stable.       Nicole [name] reports she became upset last night when she wasn't allowed to have her personal blanket.  Informed that personal items are not allowed; also informed that all of the clothing that she has sitting around her bed wasn't allow and should be put in a locker.  Understanding voiced.  Reports she has been cold and that the blankets on unit chafe her skin.  Suggested she put the blanket over a sheet so would be against her skin and that she could have more than one blanket if she was cold.  Agreed to try.   Blood pressure (!) 111/51, pulse 85, temperature 98.4 F (36.9 C), temperature source Tympanic, resp. rate 20, SpO2 100 %. There is no height or weight on file to calculate BMI.  Treatment Plan Summary: Daily contact with patient to assess and evaluate symptoms and progress in treatment Patient remains cleared by psychiatry.  Disposition is pending placement, transition of care staff pursuing disposition plan.  Meeting scheduled for 08/30/2022 did not occur.  Social work informs that Musician did not show up and that meeting will need to be rescheduled.    Ceferino Lang, NP 09/02/2022 9:56 AM

## 2022-09-02 NOTE — ED Notes (Signed)
PRN med for anxiety given. Pt is very upset re: personal blanket. Will continue to monitor for safety

## 2022-09-02 NOTE — ED Notes (Signed)
Pt sleeping in recliner bed. No noted distress. Will continue to monitor for safety 

## 2022-09-02 NOTE — ED Notes (Signed)
Pt A&O x 4, calm & cooperative, no distress noted.  Interactive with staff.  Monitoring for safety.

## 2022-09-03 NOTE — Progress Notes (Signed)
Received Nicole [name] this AM asleep in her chair bed, she got up during the day and ate her meals and snacks. She was social with her peer and the staff. She was compliant with her medication throughout the day. She requested and talked with the Child psychotherapist. She stated feeling anxious related to her housing situation.

## 2022-09-03 NOTE — Progress Notes (Signed)
[name] Progress Note   Nicole [name] [phone] phone - Redirected to call Nicole [name] @ [phone]. [name] was informed by Mr. Womack that he only accepts female adults.  Nicole [name] @ [phone] - only funded through [employer] for I/DD residents.  [name] spoke with pt during the morning to provide her with updates on her disposition and inform her of the barriers preventing her from getting placement right away.  [name] listened as the pt expressed her preferences to living with [name].  [name] offered to read a statement written by the pt of her preferences for the next team meeting in case she is unable to attend and speak for herself.   Nicole [name], MSW, [name] [facility] [phone] phone [phone] fax

## 2022-09-03 NOTE — ED Notes (Signed)
Pt sleeping at present, no distress noted.  Monitoring for safety. 

## 2022-09-03 NOTE — ED Notes (Signed)
Patient resting quietly in bed with eyes closed, Respirations equal and unlabored, skin warm and dry, no apparent distress noted. Routine safety checks conducted according to facility protocol. Will continue to monitor for safety and update as needed.

## 2022-09-03 NOTE — ED Provider Notes (Signed)
Behavioral Health Progress Note  Date and Time: 09/03/2022 8:35 AM Name: Nicole [name] MRN:  [id number]  Subjective:  Nicole [name] is a 31 y.o. female, with PMH of SCZ unspecified, MDD with psychosis, who presented voluntary to [facility] (07/25/2022) via [facility] after a verbal altercation with Nicole [name] (not patient's legal guardian) for transient SI.  This is patient's fourth visit to [facility] for similar concerns this year. Patient has been dismissed from previous group home due to threatening SI and HI, then leaving the group home.   Legal Guardian: Mom Nicole [name]) transitioning to be Dad Nicole [name]) Point of contact: Dad Nicole [name])   Nicole [name], 89 y.o., female patient seen face to face by this provider for psychiatric reassessment, consulted with Dr. Margaretha Seeds; and chart reviewed on 09/03/22.  On evaluation Nicole [name] reports she is feeling well overall. Continues to sleep without difficulty, and her appetite is intact without previously reported nausea. Reports continue to take medications as prescribed with out adverse reaction.    During evaluation Nicole [name] is sitting up in bed with no noted distress.  She is alert, oriented x 4, calm, cooperative and attentive.  Her mood is euthymic with congruent affect.  She has normal speech and behavior.  Objectively there is no evidence of psychosis/mania or delusional thinking.  Patient is able to converse coherently, goal directed thoughts, no distractibility, or pre-occupation.  She also denies suicidal/self-harm/homicidal ideation.  Patient did report some paranoia and VH of shadows last night, feeling that someone was out to kill her. This was a feeling that she previously had at home and at her brother's home, prior to going to the group home. She was able to verbalize that this was a response to things that she'd been through in her past. These were less while she was a resident of the group home because she was able  to close and lock her door while sleeping.  Patient also inquires about discharge planning, stating that she spoke with Nicole [name] from [facility] yesterday.   Patient answered question appropriately.    Diagnosis:  Final diagnoses:  Severe episode of recurrent major depressive disorder, with psychotic features (HCC)    Total Time spent with patient: 20 minutes  Past Psychiatric History: above  Past Medical History:  Past Medical History:  Diagnosis Date   Anxiety    Depression    Hypothyroidism 08/07/2022   Tobacco use disorder 08/07/2022    Past Surgical History:  Procedure Laterality Date   WISDOM TOOTH EXTRACTION Bilateral 2020   Family History:  Family History  Problem Relation Age of Onset   Hypertension Father    Diabetes Father    Family Psychiatric  History: see above Social History:  Social History   Substance and Sexual Activity  Alcohol Use Never     Social History   Substance and Sexual Activity  Drug Use Never    Social History   Socioeconomic History   Marital status: Single    Spouse name: Not on file   Number of children: Not on file   Years of education: Not on file   Highest education level: Not on file  Occupational History   Not on file  Tobacco Use   Smoking status: Every Day    Types: Cigarettes   Smokeless tobacco: Never  Vaping Use   Vaping Use: Some days  Substance and Sexual Activity   Alcohol use: Never   Drug use: Never   Sexual activity: Yes  Partners: Female    Birth control/protection: Implant  Other Topics Concern   Not on file  Social History Narrative   Not on file   Social Determinants of Health   Financial Resource Strain: Not on file  Food Insecurity: Not on file  Transportation Needs: Not on file  Physical Activity: Not on file  Stress: Not on file  Social Connections: Not on file   SDOH:  SDOH Screenings   Depression (PHQ2-9): Low Risk  (06/10/2022)  Tobacco Use: High Risk (09/01/2022)    Additional Social History:    Pain Medications: SEE MAR Prescriptions: SEE MAR Over the Counter: SEE MAR History of alcohol / drug use?: Yes Longest period of sobriety (when/how long): N/A Negative Consequences of Use:  (NONE) Withdrawal Symptoms: None Name of Substance 1: ETOH IN PAST--PT CURRENTLY DENIES 1 - Frequency: SOCIAL USE ETOH IN PAST 1 - Method of Aquiring: LEGAL 1- Route of Use: ORAL DRINK Name of Substance 2: NICOTINE-VAPE AND CIGARETTES 2 - Amount (size/oz): LESS THAN ONE PACK 2 - Frequency: DAILY 2 - Method of Aquiring: LEGAL 2 - Route of Substance Use: ORAL SMOKE    Sleep: Good  Appetite:  Fair  Current Medications:  Current Facility-Administered Medications  Medication Dose Route Frequency Provider Last Rate Last Admin   acetaminophen (TYLENOL) tablet 650 mg  650 mg Oral Q6H PRN Onuoha, Chinwendu V, NP   650 mg at 08/30/22 1856   alum & mag hydroxide-simeth (MAALOX/MYLANTA) 200-200-20 MG/5ML suspension 30 mL  30 mL Oral Q4H PRN Onuoha, Chinwendu V, NP   30 mL at 07/27/22 2151   ARIPiprazole ER (ABILIFY MAINTENA) injection 400 mg  400 mg Intramuscular Q28 days Princess Bruins, DO   400 mg at 08/30/22 1338   clotrimazole (LOTRIMIN) 1 % cream   Topical BID Bobbitt, Shalon E, NP   1 Application at 08/28/22 2130   hydrOXYzine (ATARAX) tablet 25 mg  25 mg Oral TID PRN Onuoha, Chinwendu V, NP   25 mg at 09/02/22 2134   ibuprofen (ADVIL) tablet 400 mg  400 mg Oral Q6H PRN Princess Bruins, DO   400 mg at 08/30/22 2123   levothyroxine (SYNTHROID) tablet 100 mcg  100 mcg Oral Q0600 Princess Bruins, DO   100 mcg at 09/03/22 1610   ziprasidone (GEODON) injection 20 mg  20 mg Intramuscular Q12H PRN Onuoha, Chinwendu V, NP       And   LORazepam (ATIVAN) tablet 1 mg  1 mg Oral PRN Onuoha, Chinwendu V, NP       nicotine (NICODERM CQ - dosed in mg/24 hours) patch 14 mg  14 mg Transdermal Q0600 Onuoha, Chinwendu V, NP   14 mg at 09/02/22 0916   ondansetron (ZOFRAN-ODT)  disintegrating tablet 4 mg  4 mg Oral Q8H PRN Sindy Guadeloupe, NP   4 mg at 08/30/22 2122   Oxcarbazepine (TRILEPTAL) tablet 300 mg  300 mg Oral BID Onuoha, Chinwendu V, NP   300 mg at 09/02/22 2133   pantoprazole (PROTONIX) EC tablet 20 mg  20 mg Oral Daily Princess Bruins, DO   20 mg at 09/02/22 0916   polyethylene glycol (MIRALAX / GLYCOLAX) packet 17 g  17 g Oral Daily PRN Lenard Lance, FNP   17 g at 08/31/22 2205   QUEtiapine (SEROQUEL) tablet 400 mg  400 mg Oral BID Onuoha, Chinwendu V, NP   400 mg at 09/02/22 2134   senna-docusate (Senokot-S) tablet 1 tablet  1 tablet Oral QHS Princess Bruins, DO  1 tablet at 09/02/22 2134   sertraline (ZOLOFT) tablet 150 mg  150 mg Oral Daily Onuoha, Chinwendu V, NP   150 mg at 09/02/22 0916   sodium phosphate (FLEET) 7-19 GM/118ML enema 1 enema  1 enema Rectal Once Rankin, Shuvon B, NP       traZODone (DESYREL) tablet 50 mg  50 mg Oral QHS PRN Onuoha, Chinwendu V, NP   50 mg at 09/02/22 2133   valACYclovir (VALTREX) tablet 500 mg  500 mg Oral Daily Princess Bruins, DO   500 mg at 09/02/22 1610   Current Outpatient Medications  Medication Sig Dispense Refill   ABILIFY MAINTENA 400 MG PRSY prefilled syringe 400 mg every 28 (twenty-eight) days.     cetirizine (ZYRTEC) 10 MG tablet Take 10 mg by mouth daily.     cyclobenzaprine (FLEXERIL) 10 MG tablet Take 1 tablet (10 mg total) by mouth 2 (two) times daily as needed for muscle spasms. 20 tablet 0   fluticasone (FLONASE) 50 MCG/ACT nasal spray Place 1 spray into both nostrils daily.     meloxicam (MOBIC) 15 MG tablet Take 15 mg by mouth daily.     Oxcarbazepine (TRILEPTAL) 300 MG tablet Take 1 tablet (300 mg total) by mouth 2 (two) times daily. 60 tablet 0   QUEtiapine (SEROQUEL) 400 MG tablet Take 1 tablet (400 mg total) by mouth 2 (two) times daily. 60 tablet 0   sertraline (ZOLOFT) 50 MG tablet Take 3 tablets (150 mg total) by mouth in the morning. 90 tablet 0   traZODone (DESYREL) 100 MG tablet Take 1 tablet  (100 mg total) by mouth at bedtime. 30 tablet 0   valACYclovir (VALTREX) 500 MG tablet Take 500 mg by mouth daily.      Labs  Lab Results:  Admission on 07/25/2022  Component Date Value Ref Range Status   SARS Coronavirus 2 by RT PCR 07/25/2022 NEGATIVE  NEGATIVE Final   Comment: (NOTE) SARS-CoV-2 target nucleic acids are NOT DETECTED.  The SARS-CoV-2 RNA is generally detectable in upper respiratory specimens during the acute phase of infection. The lowest concentration of SARS-CoV-2 viral copies this assay can detect is 138 copies/mL. A negative result does not preclude SARS-Cov-2 infection and should not be used as the sole basis for treatment or other patient management decisions. A negative result may occur with  improper specimen collection/handling, submission of specimen other than nasopharyngeal swab, presence of viral mutation(s) within the areas targeted by this assay, and inadequate number of viral copies(<138 copies/mL). A negative result must be combined with clinical observations, patient history, and epidemiological information. The expected result is Negative.  Fact Sheet for Patients:  BloggerCourse.com  Fact Sheet for Healthcare Providers:  SeriousBroker.it  This test is no                          t yet approved or cleared by the [place] FDA and  has been authorized for detection and/or diagnosis of SARS-CoV-2 by FDA under an Emergency Use Authorization (EUA). This EUA will remain  in effect (meaning this test can be used) for the duration of the COVID-19 declaration under Section 564(b)(1) of the Act, 21 U.S.C.section 360bbb-3(b)(1), unless the authorization is terminated  or revoked sooner.       Influenza A by PCR 07/25/2022 NEGATIVE  NEGATIVE Final   Influenza B by PCR 07/25/2022 NEGATIVE  NEGATIVE Final   Comment: (NOTE) The Xpert Xpress SARS-CoV-2/FLU/RSV plus assay is intended  as an aid in  the diagnosis of influenza from Nasopharyngeal swab specimens and should not be used as a sole basis for treatment. Nasal washings and aspirates are unacceptable for Xpert Xpress SARS-CoV-2/FLU/RSV testing.  Fact Sheet for Patients: BloggerCourse.com  Fact Sheet for Healthcare Providers: SeriousBroker.it  This test is not yet approved or cleared by the [place] FDA and has been authorized for detection and/or diagnosis of SARS-CoV-2 by FDA under an Emergency Use Authorization (EUA). This EUA will remain in effect (meaning this test can be used) for the duration of the COVID-19 declaration under Section 564(b)(1) of the Act, 21 U.S.C. section 360bbb-3(b)(1), unless the authorization is terminated or revoked.  Performed at [facility], [address], [place], [place] [address]    WBC 07/25/2022 8.3  4.0 - 10.5 K/uL Final   RBC 07/25/2022 4.44  3.87 - 5.11 MIL/uL Final   Hemoglobin 07/25/2022 13.7  12.0 - 15.0 g/dL Final   HCT 67/67/2094 40.2  36.0 - 46.0 % Final   MCV 07/25/2022 90.5  80.0 - 100.0 fL Final   MCH 07/25/2022 30.9  26.0 - 34.0 pg Final   MCHC 07/25/2022 34.1  30.0 - 36.0 g/dL Final   RDW 70/96/2836 12.2  11.5 - 15.5 % Final   Platelets 07/25/2022 248  150 - 400 K/uL Final   nRBC 07/25/2022 0.0  0.0 - 0.2 % Final   Neutrophils Relative % 07/25/2022 43  % Final   Neutro Abs 07/25/2022 3.6  1.7 - 7.7 K/uL Final   Lymphocytes Relative 07/25/2022 52  % Final   Lymphs Abs 07/25/2022 4.2 (H)  0.7 - 4.0 K/uL Final   Monocytes Relative 07/25/2022 5  % Final   Monocytes Absolute 07/25/2022 0.4  0.1 - 1.0 K/uL Final   Eosinophils Relative 07/25/2022 0  % Final   Eosinophils Absolute 07/25/2022 0.0  0.0 - 0.5 K/uL Final   Basophils Relative 07/25/2022 0  % Final   Basophils Absolute 07/25/2022 0.0  0.0 - 0.1 K/uL Final   Immature Granulocytes 07/25/2022 0  % Final   Abs Immature Granulocytes 07/25/2022 0.02  0.00  - 0.07 K/uL Final   Performed at [facility], [address], [place], [place] [address]   Sodium 07/25/2022 138  135 - 145 mmol/L Final   Potassium 07/25/2022 4.0  3.5 - 5.1 mmol/L Final   Chloride 07/25/2022 104  98 - 111 mmol/L Final   CO2 07/25/2022 29  22 - 32 mmol/L Final   Glucose, Bld 07/25/2022 83  70 - 99 mg/dL Final   Glucose reference range applies only to samples taken after fasting for at least 8 hours.   BUN 07/25/2022 11  6 - 20 mg/dL Final   Creatinine, Ser 07/25/2022 0.97  0.44 - 1.00 mg/dL Final   Calcium 65/46/5035 9.2  8.9 - 10.3 mg/dL Final   Total Protein 46/56/8127 7.0  6.5 - 8.1 g/dL Final   Albumin 51/70/0174 3.8  3.5 - 5.0 g/dL Final   AST 94/49/6759 18  15 - 41 U/L Final   ALT 07/25/2022 22  0 - 44 U/L Final   Alkaline Phosphatase 07/25/2022 64  38 - 126 U/L Final   Total Bilirubin 07/25/2022 0.2 (L)  0.3 - 1.2 mg/dL Final   GFR, Estimated 07/25/2022 >60  >60 mL/min Final   Comment: (NOTE) Calculated using the CKD-EPI Creatinine Equation (2021)    Anion gap 07/25/2022 5  5 - 15 Final   Performed at  [facility], [address], [place], [place] [address]   POC Amphetamine UR 07/25/2022 None Detected  NONE DETECTED (Cut Off Level 1000 ng/mL) Preliminary   POC Secobarbital (BAR) 07/25/2022 None Detected  NONE DETECTED (Cut Off Level 300 ng/mL) Preliminary   POC Buprenorphine (BUP) 07/25/2022 None Detected  NONE DETECTED (Cut Off Level 10 ng/mL) Preliminary   POC Oxazepam (BZO) 07/25/2022 None Detected  NONE DETECTED (Cut Off Level 300 ng/mL) Preliminary   POC Cocaine UR 07/25/2022 None Detected  NONE DETECTED (Cut Off Level 300 ng/mL) Preliminary   POC Methamphetamine UR 07/25/2022 None Detected  NONE DETECTED (Cut Off Level 1000 ng/mL) Preliminary   POC Morphine 07/25/2022 None Detected  NONE DETECTED (Cut Off Level 300 ng/mL) Preliminary   POC Methadone UR 07/25/2022 None Detected  NONE DETECTED (Cut Off Level 300 ng/mL) Preliminary   POC  Oxycodone UR 07/25/2022 None Detected  NONE DETECTED (Cut Off Level 100 ng/mL) Preliminary   POC Marijuana UR 07/25/2022 None Detected  NONE DETECTED (Cut Off Level 50 ng/mL) Preliminary   SARSCOV2ONAVIRUS 2 AG 07/25/2022 NEGATIVE  NEGATIVE Final   Comment: (NOTE) SARS-CoV-2 antigen NOT DETECTED.   Negative results are presumptive.  Negative results do not preclude SARS-CoV-2 infection and should not be used as the sole basis for treatment or other patient management decisions, including infection  control decisions, particularly in the presence of clinical signs and  symptoms consistent with COVID-19, or in those who have been in contact with the virus.  Negative results must be combined with clinical observations, patient history, and epidemiological information. The expected result is Negative.  Fact Sheet for Patients: https://www.jennings-kim.com/  Fact Sheet for Healthcare Providers: https://alexander-rogers.biz/  This test is not yet approved or cleared by the [place] FDA and  has been authorized for detection and/or diagnosis of SARS-CoV-2 by FDA under an Emergency Use Authorization (EUA).  This EUA will remain in effect (meaning this test can be used) for the duration of  the COV                          ID-19 declaration under Section 564(b)(1) of the Act, 21 U.S.C. section 360bbb-3(b)(1), unless the authorization is terminated or revoked sooner.     Cholesterol 07/25/2022 181  0 - 200 mg/dL Final   Triglycerides 40/98/1191 118  <150 mg/dL Final   HDL 47/82/9562 45  >40 mg/dL Final   Total CHOL/HDL Ratio 07/25/2022 4.0  RATIO Final   VLDL 07/25/2022 24  0 - 40 mg/dL Final   LDL Cholesterol 07/25/2022 112 (H)  0 - 99 mg/dL Final   Comment:        Total Cholesterol/HDL:CHD Risk Coronary Heart Disease Risk Table                     Men   Women  1/2 Average Risk   3.4   3.3  Average Risk       5.0   4.4  2 X Average Risk   9.6   7.1  3 X  Average Risk  23.4   11.0        Use the calculated Patient Ratio above and the CHD Risk Table to determine the patient's CHD Risk.        ATP III CLASSIFICATION (LDL):  <100     mg/dL   Optimal  130-865  mg/dL   Near or Above  Optimal  130-159  mg/dL   Borderline  604-540  mg/dL   High  >981     mg/dL   Very High Performed at [facility], [address] [place], [place] [address]    TSH 07/25/2022 6.668 (H)  0.350 - 4.500 uIU/mL Final   Comment: Performed by a 3rd Generation assay with a functional sensitivity of <=0.01 uIU/mL. Performed at [facility], [address] [place], [place] [address]    Glucose-Capillary 07/26/2022 104 (H)  70 - 99 mg/dL Final   Glucose reference range applies only to samples taken after fasting for at least 8 hours.   T3, Free 07/31/2022 2.3  2.0 - 4.4 pg/mL Final   Comment: (NOTE) Performed At: [facility] [address] [place], [place] [id number] Jolene Schimke MD IO:[id number]    Free T4 07/31/2022 0.60 (L)  0.61 - 1.12 ng/dL Final   Comment: (NOTE) Biotin ingestion may interfere with free T4 tests. If the results are inconsistent with the TSH level, previous test results, or the clinical presentation, then consider biotin interference. If needed, order repeat testing after stopping biotin. Performed at [facility], [address] [place], [place] [address]    Glucose-Capillary 08/29/2022 100 (H)  70 - 99 mg/dL Final   Glucose reference range applies only to samples taken after fasting for at least 8 hours.  Admission on 07/04/2022, Discharged on 07/05/2022  Component Date Value Ref Range Status   Sodium 07/04/2022 142  135 - 145 mmol/L Final   Potassium 07/04/2022 4.4  3.5 - 5.1 mmol/L Final   Chloride 07/04/2022 109  98 - 111 mmol/L Final   CO2 07/04/2022 26  22 - 32 mmol/L Final   Glucose, Bld 07/04/2022 96  70 - 99 mg/dL Final   Glucose reference range applies only to samples taken  after fasting for at least 8 hours.   BUN 07/04/2022 15  6 - 20 mg/dL Final   Creatinine, Ser 07/04/2022 0.83  0.44 - 1.00 mg/dL Final   Calcium 02/72/5366 9.3  8.9 - 10.3 mg/dL Final   Total Protein 44/12/4740 7.2  6.5 - 8.1 g/dL Final   Albumin 59/56/3875 3.9  3.5 - 5.0 g/dL Final   AST 64/33/2951 23  15 - 41 U/L Final   ALT 07/04/2022 28  0 - 44 U/L Final   Alkaline Phosphatase 07/04/2022 77  38 - 126 U/L Final   Total Bilirubin 07/04/2022 0.4  0.3 - 1.2 mg/dL Final   GFR, Estimated 07/04/2022 >60  >60 mL/min Final   Comment: (NOTE) Calculated using the CKD-EPI Creatinine Equation (2021)    Anion gap 07/04/2022 7  5 - 15 Final   Performed at [facility], [address] [place], [place] [address]   Alcohol, Ethyl (B) 07/04/2022 <10  <10 mg/dL Final   Comment: (NOTE) Lowest detectable limit for serum alcohol is 10 mg/dL.  For medical purposes only. Performed at [facility], [address], [place], [place] [address]    WBC 07/04/2022 7.0  4.0 - 10.5 K/uL Final   RBC 07/04/2022 4.18  3.87 - 5.11 MIL/uL Final   Hemoglobin 07/04/2022 12.8  12.0 - 15.0 g/dL Final   HCT 16/10/930 39.1  36.0 - 46.0 % Final   MCV 07/04/2022 93.5  80.0 - 100.0 fL Final   MCH 07/04/2022 30.6  26.0 - 34.0 pg Final   MCHC 07/04/2022 32.7  30.0 - 36.0 g/dL Final  RDW 07/04/2022 12.9  11.5 - 15.5 % Final   Platelets 07/04/2022 243  150 - 400 K/uL Final   nRBC 07/04/2022 0.0  0.0 - 0.2 % Final   Neutrophils Relative % 07/04/2022 43  % Final   Neutro Abs 07/04/2022 3.0  1.7 - 7.7 K/uL Final   Lymphocytes Relative 07/04/2022 50  % Final   Lymphs Abs 07/04/2022 3.5  0.7 - 4.0 K/uL Final   Monocytes Relative 07/04/2022 7  % Final   Monocytes Absolute 07/04/2022 0.5  0.1 - 1.0 K/uL Final   Eosinophils Relative 07/04/2022 0  % Final   Eosinophils Absolute 07/04/2022 0.0  0.0 - 0.5 K/uL Final   Basophils Relative 07/04/2022 0  % Final   Basophils Absolute 07/04/2022  0.0  0.0 - 0.1 K/uL Final   Immature Granulocytes 07/04/2022 0  % Final   Abs Immature Granulocytes 07/04/2022 0.01  0.00 - 0.07 K/uL Final   Performed at [facility] [place] [facility], [address] [place], [place] [address]   I-stat hCG, quantitative 07/04/2022 <5.0  <5 mIU/mL Final   Comment 3 07/04/2022          Final   Comment:   GEST. AGE      CONC.  (mIU/mL)   <=1 WEEK        5 - 50     2 WEEKS       50 - 500     3 WEEKS       100 - 10,000     4 WEEKS     1,000 - 30,000        FEMALE AND NON-PREGNANT FEMALE:     LESS THAN 5 mIU/mL   Admission on 06/14/2022, Discharged on 06/14/2022  Component Date Value Ref Range Status   Color, UA 06/14/2022 yellow  yellow Final   Clarity, UA 06/14/2022 cloudy (A)  clear Final   Glucose, UA 06/14/2022 negative  negative mg/dL Final   Bilirubin, UA 60/45/409809/10/2021 negative  negative Final   Ketones, POC UA 06/14/2022 negative  negative mg/dL Final   Spec Grav, UA 11/91/478209/10/2021 1.025  1.010 - 1.025 Final   Blood, UA 06/14/2022 negative  negative Final   pH, UA 06/14/2022 7.5  5.0 - 8.0 Final   Protein Ur, POC 06/14/2022 =30 (A)  negative mg/dL Final   Urobilinogen, UA 06/14/2022 0.2  0.2 or 1.0 E.U./dL Final   Nitrite, UA 95/62/130809/10/2021 Negative  Negative Final   Leukocytes, UA 06/14/2022 Negative  Negative Final   Preg Test, Ur 06/14/2022 Negative  Negative Final   Specimen Description 06/14/2022 URINE, CLEAN CATCH   Final   Special Requests 06/14/2022    Final                   Value:NONE Performed at [facility], [address] [place], [place] [address]    Culture 06/14/2022 MULTIPLE SPECIES PRESENT, SUGGEST RECOLLECTION (A)   Final   Report Status 06/14/2022 06/16/2022 FINAL   Final  Admission on 06/07/2022, Discharged on 06/10/2022  Component Date Value Ref Range Status   SARS Coronavirus 2 by RT PCR 06/07/2022 NEGATIVE  NEGATIVE Final   Comment: (NOTE) SARS-CoV-2 target nucleic acids are NOT DETECTED.  The SARS-CoV-2 RNA is  generally detectable in upper respiratory specimens during the acute phase of infection. The lowest concentration of SARS-CoV-2 viral copies this assay can detect is 138 copies/mL. A negative result does not preclude SARS-Cov-2 infection and should not be used as  the sole basis for treatment or other patient management decisions. A negative result may occur with  improper specimen collection/handling, submission of specimen other than nasopharyngeal swab, presence of viral mutation(s) within the areas targeted by this assay, and inadequate number of viral copies(<138 copies/mL). A negative result must be combined with clinical observations, patient history, and epidemiological information. The expected result is Negative.  Fact Sheet for Patients:  BloggerCourse.com  Fact Sheet for Healthcare Providers:  SeriousBroker.it  This test is no                          t yet approved or cleared by the [place] FDA and  has been authorized for detection and/or diagnosis of SARS-CoV-2 by FDA under an Emergency Use Authorization (EUA). This EUA will remain  in effect (meaning this test can be used) for the duration of the COVID-19 declaration under Section 564(b)(1) of the Act, 21 U.S.C.section 360bbb-3(b)(1), unless the authorization is terminated  or revoked sooner.       Influenza A by PCR 06/07/2022 NEGATIVE  NEGATIVE Final   Influenza B by PCR 06/07/2022 NEGATIVE  NEGATIVE Final   Comment: (NOTE) The Xpert Xpress SARS-CoV-2/FLU/RSV plus assay is intended as an aid in the diagnosis of influenza from Nasopharyngeal swab specimens and should not be used as a sole basis for treatment. Nasal washings and aspirates are unacceptable for Xpert Xpress SARS-CoV-2/FLU/RSV testing.  Fact Sheet for Patients: BloggerCourse.com  Fact Sheet for Healthcare Providers: SeriousBroker.it  This  test is not yet approved or cleared by the [place] FDA and has been authorized for detection and/or diagnosis of SARS-CoV-2 by FDA under an Emergency Use Authorization (EUA). This EUA will remain in effect (meaning this test can be used) for the duration of the COVID-19 declaration under Section 564(b)(1) of the Act, 21 U.S.C. section 360bbb-3(b)(1), unless the authorization is terminated or revoked.  Performed at [facility], [address] [place], [place] [address]    WBC 06/07/2022 5.7  4.0 - 10.5 K/uL Final   RBC 06/07/2022 4.39  3.87 - 5.11 MIL/uL Final   Hemoglobin 06/07/2022 13.2  12.0 - 15.0 g/dL Final   HCT 60/45/4098 40.4  36.0 - 46.0 % Final   MCV 06/07/2022 92.0  80.0 - 100.0 fL Final   MCH 06/07/2022 30.1  26.0 - 34.0 pg Final   MCHC 06/07/2022 32.7  30.0 - 36.0 g/dL Final   RDW 11/91/4782 12.4  11.5 - 15.5 % Final   Platelets 06/07/2022 308  150 - 400 K/uL Final   nRBC 06/07/2022 0.0  0.0 - 0.2 % Final   Neutrophils Relative % 06/07/2022 42  % Final   Neutro Abs 06/07/2022 2.4  1.7 - 7.7 K/uL Final   Lymphocytes Relative 06/07/2022 54  % Final   Lymphs Abs 06/07/2022 3.1  0.7 - 4.0 K/uL Final   Monocytes Relative 06/07/2022 4  % Final   Monocytes Absolute 06/07/2022 0.2  0.1 - 1.0 K/uL Final   Eosinophils Relative 06/07/2022 0  % Final   Eosinophils Absolute 06/07/2022 0.0  0.0 - 0.5 K/uL Final   Basophils Relative 06/07/2022 0  % Final   Basophils Absolute 06/07/2022 0.0  0.0 - 0.1 K/uL Final   Immature Granulocytes 06/07/2022 0  % Final   Abs Immature Granulocytes 06/07/2022 0.01  0.00 - 0.07 K/uL Final   Performed at [facility], [address] [place], [place] [address]  Sodium 06/07/2022 139  135 - 145 mmol/L Final   Potassium 06/07/2022 4.0  3.5 - 5.1 mmol/L Final   Chloride 06/07/2022 104  98 - 111 mmol/L Final   CO2 06/07/2022 28  22 - 32 mmol/L Final   Glucose, Bld 06/07/2022 104 (H)  70 - 99 mg/dL Final   Glucose reference range  applies only to samples taken after fasting for at least 8 hours.   BUN 06/07/2022 8  6 - 20 mg/dL Final   Creatinine, Ser 06/07/2022 0.84  0.44 - 1.00 mg/dL Final   Calcium 17/61/6073 9.1  8.9 - 10.3 mg/dL Final   Total Protein 71/03/2693 6.9  6.5 - 8.1 g/dL Final   Albumin 85/46/2703 3.7  3.5 - 5.0 g/dL Final   AST 50/06/3817 19  15 - 41 U/L Final   ALT 06/07/2022 22  0 - 44 U/L Final   Alkaline Phosphatase 06/07/2022 54  38 - 126 U/L Final   Total Bilirubin 06/07/2022 0.4  0.3 - 1.2 mg/dL Final   GFR, Estimated 06/07/2022 >60  >60 mL/min Final   Comment: (NOTE) Calculated using the CKD-EPI Creatinine Equation (2021)    Anion gap 06/07/2022 7  5 - 15 Final   Performed at [facility], [address] [place], [place] [address]   Hgb A1c MFr Bld 06/07/2022 5.0  4.8 - 5.6 % Final   Comment: (NOTE) Pre diabetes:          5.7%-6.4%  Diabetes:              >6.4%  Glycemic control for   <7.0% adults with diabetes    Mean Plasma Glucose 06/07/2022 96.8  mg/dL Final   Performed at [facility], [address], [place], [place] [address]   TSH 06/07/2022 1.620  0.350 - 4.500 uIU/mL Final   Comment: Performed by a 3rd Generation assay with a functional sensitivity of <=0.01 uIU/mL. Performed at [facility], [address] [place], [place] [address]    RPR Ser Ql 06/07/2022 NON REACTIVE  NON REACTIVE Final   Performed at [facility], [address] [place], [place] [address]   Color, Urine 06/07/2022 YELLOW  YELLOW Final   APPearance 06/07/2022 HAZY (A)  CLEAR Final   Specific Gravity, Urine 06/07/2022 1.018  1.005 - 1.030 Final   pH 06/07/2022 7.0  5.0 - 8.0 Final   Glucose, UA 06/07/2022 NEGATIVE  NEGATIVE mg/dL Final   Hgb urine dipstick 06/07/2022 NEGATIVE  NEGATIVE Final   Bilirubin Urine 06/07/2022 NEGATIVE  NEGATIVE Final   Ketones, ur 06/07/2022 NEGATIVE  NEGATIVE mg/dL Final   Protein, ur 02/58/5277 NEGATIVE  NEGATIVE mg/dL Final   Nitrite  82/42/3536 NEGATIVE  NEGATIVE Final   Leukocytes,Ua 06/07/2022 NEGATIVE  NEGATIVE Final   Performed at [facility], [address] [place], [place] [address]   Cholesterol 06/07/2022 164  0 - 200 mg/dL Final   Triglycerides 54/00/8676 158 (H)  <150 mg/dL Final   HDL 19/50/9326 44  >40 mg/dL Final   Total CHOL/HDL Ratio 06/07/2022 3.7  RATIO Final   VLDL 06/07/2022 32  0 - 40 mg/dL Final   LDL Cholesterol 06/07/2022 88  0 - 99 mg/dL Final   Comment:        Total Cholesterol/HDL:CHD Risk Coronary Heart Disease Risk Table                     Men   Women  1/2 Average Risk   3.4   3.3  Average Risk       5.0   4.4  2 X Average Risk   9.6   7.1  3 X Average Risk  23.4   11.0        Use the calculated Patient Ratio above and the CHD Risk Table to determine the patient's CHD Risk.        ATP III CLASSIFICATION (LDL):  <100     mg/dL   Optimal  244-010  mg/dL   Near or Above                    Optimal  130-159  mg/dL   Borderline  272-536  mg/dL   High  >644     mg/dL   Very High Performed at [facility], [address] [place], [place] [address]    HIV Screen 4th Generation wRfx 06/07/2022 Non Reactive  Non Reactive Final   Performed at [facility], [address] [place], [place] [address]   SARSCOV2ONAVIRUS 2 AG 06/07/2022 NEGATIVE  NEGATIVE Final   Comment: (NOTE) SARS-CoV-2 antigen NOT DETECTED.   Negative results are presumptive.  Negative results do not preclude SARS-CoV-2 infection and should not be used as the sole basis for treatment or other patient management decisions, including infection  control decisions, particularly in the presence of clinical signs and  symptoms consistent with COVID-19, or in those who have been in contact with the virus.  Negative results must be combined with clinical observations, patient history, and epidemiological information. The expected result is Negative.  Fact Sheet for Patients:  https://www.jennings-kim.com/  Fact Sheet for Healthcare Providers: https://alexander-rogers.biz/  This test is not yet approved or cleared by the [place] FDA and  has been authorized for detection and/or diagnosis of SARS-CoV-2 by FDA under an Emergency Use Authorization (EUA).  This EUA will remain in effect (meaning this test can be used) for the duration of  the COV                          ID-19 declaration under Section 564(b)(1) of the Act, 21 U.S.C. section 360bbb-3(b)(1), unless the authorization is terminated or revoked sooner.     POC Amphetamine UR 06/07/2022 None Detected  NONE DETECTED (Cut Off Level 1000 ng/mL) Final   POC Secobarbital (BAR) 06/07/2022 None Detected  NONE DETECTED (Cut Off Level 300 ng/mL) Final   POC Buprenorphine (BUP) 06/07/2022 None Detected  NONE DETECTED (Cut Off Level 10 ng/mL) Final   POC Oxazepam (BZO) 06/07/2022 None Detected  NONE DETECTED (Cut Off Level 300 ng/mL) Final   POC Cocaine UR 06/07/2022 None Detected  NONE DETECTED (Cut Off Level 300 ng/mL) Final   POC Methamphetamine UR 06/07/2022 None Detected  NONE DETECTED (Cut Off Level 1000 ng/mL) Final   POC Morphine 06/07/2022 None Detected  NONE DETECTED (Cut Off Level 300 ng/mL) Final   POC Methadone UR 06/07/2022 None Detected  NONE DETECTED (Cut Off Level 300 ng/mL) Final   POC Oxycodone UR 06/07/2022 None Detected  NONE DETECTED (Cut Off Level 100 ng/mL) Final   POC Marijuana UR 06/07/2022 None Detected  NONE DETECTED (Cut Off Level 50 ng/mL) Final   Preg Test, Ur 06/08/2022 NEGATIVE  NEGATIVE Final   Comment:        THE SENSITIVITY OF THIS METHODOLOGY IS >20 mIU/mL. Performed at [facility], [address], [place], [place] [address]    Preg Test, Ur 06/08/2022 NEGATIVE  NEGATIVE Final   Comment:        THE SENSITIVITY OF THIS METHODOLOGY IS >24 mIU/mL     Blood Alcohol level:  Lab Results  Component Value Date   ETH <10 07/04/2022   ETH  <10 02/07/2022    Metabolic Disorder Labs: Lab Results  Component Value Date   HGBA1C 5.0 06/07/2022   MPG 96.8 06/07/2022   MPG 96.8 02/07/2022   No results found for: "PROLACTIN" Lab Results  Component Value Date   CHOL 181 07/25/2022   TRIG 118 07/25/2022   HDL 45 07/25/2022   CHOLHDL 4.0 07/25/2022   VLDL 24 07/25/2022   LDLCALC 112 (H) 07/25/2022   LDLCALC 88 06/07/2022    Therapeutic Lab Levels: No results found for: "LITHIUM" No results found for: "VALPROATE" No results found for: "CBMZ"  Physical Findings   GAD-7    Flowsheet Row Office Visit [facility] 12/17/2021 [facility]  Total GAD-7 Score 17      PHQ2-9    Flowsheet Row ED from 06/07/2022 in [facility] Office Visit from 12/17/2021 in [facility]  PHQ-2 Total Score 1 2  PHQ-9 Total Score 2 8      Flowsheet Row ED from 07/25/2022 in [facility] ED from 07/04/2022 in [place]  [facility] from 06/14/2022 in [facility]   C-SSRS RISK CATEGORY High Risk No Risk No Risk        Musculoskeletal  Strength & Muscle Tone: within normal limits Gait & Station: normal Patient leans: N/A  Psychiatric Specialty Exam  Presentation  General Appearance:  Appropriate for Environment  Eye Contact: Good  Speech: Clear and Coherent; Normal Rate  Speech Volume: Normal  Handedness: Right   Mood and Affect  Mood: Euthymic  Affect: Appropriate; Congruent   Thought Process  Thought Processes: Coherent; Goal Directed  Descriptions of Associations:Intact  Orientation:Full (Time, Place and Person)  Thought Content:Logical  Diagnosis of Schizophrenia or Schizoaffective disorder in past: No    Hallucinations:Hallucinations: None  Ideas of Reference:None  Suicidal Thoughts:Suicidal Thoughts: No  Homicidal Thoughts:Homicidal Thoughts:  No   Sensorium  Memory: Immediate Good; Recent Good  Judgment: Intact  Insight: Present   Executive Functions  Concentration: Good  Attention Span: Good  Recall: Fair  Fund of Knowledge: Fair  Language: Good   Psychomotor Activity  Psychomotor Activity: Psychomotor Activity: Normal   Assets  Assets: Communication Skills; Desire for Improvement   Sleep  Sleep: Sleep: Good   Nutritional Assessment (For OBS and [facility] admissions only) Has the patient had a weight loss or gain of 10 pounds or more in the last 3 months?: No Has the patient had a decrease in food intake/or appetite?: No Does the patient have dental problems?: No Does the patient have eating habits or behaviors that may be indicators of an eating disorder including binging or inducing vomiting?: No Has the patient recently lost weight without trying?: 0 Has the patient been eating poorly because of a decreased appetite?: 0 Malnutrition Screening Tool Score: 0   Physical Exam  Physical Exam Vitals and nursing note reviewed.  Constitutional:      Appearance: Normal appearance. She is well-developed.  Cardiovascular:     Rate and Rhythm: Normal rate.  Pulmonary:     Effort: Pulmonary effort is normal.  Musculoskeletal:        General: Normal range of motion.     Cervical back: Normal range of motion.  Skin:    General: Skin is warm and dry.  Neurological:     Mental Status: She is alert and oriented to person, place, and time.  Psychiatric:        Attention and Perception: Attention and perception normal.        Mood and Affect: Mood and affect normal.        Speech: Speech normal.        Behavior: Behavior normal.        Thought Content: Thought content normal.        Cognition and Memory: Cognition normal.    Review of Systems  Constitutional: Negative.   HENT: Negative.    Eyes: Negative.   Respiratory: Negative.    Cardiovascular: Negative.   Gastrointestinal:  Negative  for constipation (several BM yesterday), nausea and vomiting. Abdominal pain: Denies. Genitourinary: Negative.   Musculoskeletal: Negative.   Skin: Negative.   Neurological: Negative.   Psychiatric/Behavioral:  Depression: Stable. Hallucinations: Denies. Suicidal ideas: Denies. Nervous/anxious: Stable.    Blood pressure 105/71, pulse (!) 111, temperature 98.1 F (36.7 C), temperature source Tympanic, resp. rate 18, SpO2 98 %. There is no height or weight on file to calculate BMI.  Treatment Plan Summary: Daily contact with patient to assess and evaluate symptoms and progress in treatment Patient remains cleared by psychiatry.  Disposition is pending placement, transition of care staff pursuing disposition plan.  Meeting scheduled for 08/30/2022 did not occur.  Social work informs that meeting will need to be rescheduled.    [name], MD 09/03/2022 8:35 AM

## 2022-09-04 NOTE — ED Provider Notes (Signed)
Behavioral Health Progress Note  Date and Time: 09/04/2022 10:47 PM Name: Nicole [name] MRN:  [id number]  Subjective:  Nicole [name] is a 31 y.o. female, with PMH of SCZ unspecified, MDD with psychosis, who presented voluntary to [facility] (07/25/2022) via [facility] after a verbal altercation with [name] (not patient's legal guardian) for transient SI.  This is patient's fourth visit to [facility] for similar concerns this year. Patient has been dismissed from previous group home due to threatening SI and HI, then leaving the group home.   Legal Guardian: [name]) transitioning to be [name]) Point of contact: [name])   Nicole [name], 41 y.o., female patient seen face to face by this provider for psychiatric reassessment, consulted with Dr. Jeral Fruit; and chart reviewed on 09/04/22.  On evaluation Nicole [name] reports she is feeling well overall. Continues to sleep without difficulty, and her appetite is intact without previously reported nausea. Reports continue to take medications as prescribed with out adverse reaction.    During evaluation Nicole [name] is seen on the unit with no noted distress.  She is alert, oriented x 4, calm, cooperative and attentive.  Her mood is euthymic with congruent affect.  She has normal speech and behavior.  Objectively there is no evidence of psychosis/mania or delusional thinking.  Patient is able to converse coherently, goal directed thoughts, no distractibility, or pre-occupation.  She also denies suicidal/self-harm/homicidal ideation.  Patient continues to report some paranoia, today about people trying to "play her." However, she has been advocating for herself in trying to figure out placement. She has been in contact with her Scientist, research (medical) as well as Misty Stanley from an ALF. She has some difficulty with initial processing of the information offered to her, but is able to recall events/conversations to  understand them later. We discuss extensively the conversations that she has had herself as well as those [name], Heather, had with both DSS and Renee. Patient has begun to feel discouraged, but motivational interviewing is used to encourage her to continue to advocate for herself while we are also advocating on her behalf.    Patient answered questions appropriately.    Diagnosis:  Final diagnoses:  Severe episode of recurrent major depressive disorder, with psychotic features ([place])    Total Time spent with patient: 20 minutes  Past Psychiatric History: above  Past Medical History:  Past Medical History:  Diagnosis Date   Anxiety    Depression    Hypothyroidism 08/07/2022   Tobacco use disorder 08/07/2022    Past Surgical History:  Procedure Laterality Date   WISDOM TOOTH EXTRACTION Bilateral 2020   Family History:  Family History  Problem Relation Age of Onset   Hypertension Father    Diabetes Father    Family Psychiatric  History: see above Social History:  Social History   Substance and Sexual Activity  Alcohol Use Never     Social History   Substance and Sexual Activity  Drug Use Never    Social History   Socioeconomic History   Marital status: Single    Spouse name: Not on file   Number of children: Not on file   Years of education: Not on file   Highest education level: Not on file  Occupational History   Not on file  Tobacco Use   Smoking status: Every Day    Types: Cigarettes   Smokeless tobacco: Never  Vaping Use   Vaping Use: Some days  Substance and Sexual Activity  Alcohol use: Never   Drug use: Never   Sexual activity: Yes    Partners: Female    Birth control/protection: Implant  Other Topics Concern   Not on file  Social History Narrative   Not on file   Social Determinants of Health   Financial Resource Strain: Not on file  Food Insecurity: Not on file  Transportation Needs: Not on file  Physical Activity: Not on file  Stress:  Not on file  Social Connections: Not on file   SDOH:  SDOH Screenings   Depression (PHQ2-9): Low Risk  (06/10/2022)  Tobacco Use: High Risk (09/01/2022)   Additional Social History:    Pain Medications: SEE MAR Prescriptions: SEE MAR Over the Counter: SEE MAR History of alcohol / drug use?: Yes Longest period of sobriety (when/how long): N/A Negative Consequences of Use:  (NONE) Withdrawal Symptoms: None Name of Substance 1: ETOH IN PAST--PT CURRENTLY DENIES 1 - Frequency: SOCIAL USE ETOH IN PAST 1 - Method of Aquiring: LEGAL 1- Route of Use: ORAL DRINK Name of Substance 2: NICOTINE-VAPE AND CIGARETTES 2 - Amount (size/oz): LESS THAN ONE PACK 2 - Frequency: DAILY 2 - Method of Aquiring: LEGAL 2 - Route of Substance Use: ORAL SMOKE    Sleep: Good  Appetite:  Fair  Current Medications:  Current Facility-Administered Medications  Medication Dose Route Frequency Provider Last Rate Last Admin   acetaminophen (TYLENOL) tablet 650 mg  650 mg Oral Q6H PRN Onuoha, Chinwendu V, NP   650 mg at 08/30/22 1856   alum & mag hydroxide-simeth (MAALOX/MYLANTA) 200-200-20 MG/5ML suspension 30 mL  30 mL Oral Q4H PRN Onuoha, Chinwendu V, NP   30 mL at 07/27/22 2151   ARIPiprazole ER (ABILIFY MAINTENA) injection 400 mg  400 mg Intramuscular Q28 days Princess Bruins, DO   400 mg at 08/30/22 1338   clotrimazole (LOTRIMIN) 1 % cream   Topical BID Bobbitt, Shalon E, NP   Given at 09/04/22 2122   hydrOXYzine (ATARAX) tablet 25 mg  25 mg Oral TID PRN Onuoha, Chinwendu V, NP   25 mg at 09/04/22 1846   ibuprofen (ADVIL) tablet 400 mg  400 mg Oral Q6H PRN Princess Bruins, DO   400 mg at 08/30/22 2123   levothyroxine (SYNTHROID) tablet 100 mcg  100 mcg Oral Q0600 Princess Bruins, DO   100 mcg at 09/04/22 4825   ziprasidone (GEODON) injection 20 mg  20 mg Intramuscular Q12H PRN Onuoha, Chinwendu V, NP       And   LORazepam (ATIVAN) tablet 1 mg  1 mg Oral PRN Onuoha, Chinwendu V, NP       nicotine (NICODERM CQ  - dosed in mg/24 hours) patch 14 mg  14 mg Transdermal Q0600 Onuoha, Chinwendu V, NP   14 mg at 09/04/22 0950   ondansetron (ZOFRAN-ODT) disintegrating tablet 4 mg  4 mg Oral Q8H PRN Sindy Guadeloupe, NP   4 mg at 08/30/22 2122   Oxcarbazepine (TRILEPTAL) tablet 300 mg  300 mg Oral BID Onuoha, Chinwendu V, NP   300 mg at 09/04/22 2121   pantoprazole (PROTONIX) EC tablet 20 mg  20 mg Oral Daily Princess Bruins, DO   20 mg at 09/04/22 0951   polyethylene glycol (MIRALAX / GLYCOLAX) packet 17 g  17 g Oral Daily PRN Lenard Lance, FNP   17 g at 08/31/22 2205   QUEtiapine (SEROQUEL) tablet 400 mg  400 mg Oral BID Onuoha, Chinwendu V, NP   400 mg at 09/04/22 2121  senna-docusate (Senokot-S) tablet 1 tablet  1 tablet Oral QHS Merrily Brittle, DO   1 tablet at 09/04/22 2121   sertraline (ZOLOFT) tablet 150 mg  150 mg Oral Daily Onuoha, Chinwendu V, NP   150 mg at 09/04/22 0951   sodium phosphate (FLEET) 7-19 GM/118ML enema 1 enema  1 enema Rectal Once Rankin, Shuvon B, NP       traZODone (DESYREL) tablet 50 mg  50 mg Oral QHS PRN Onuoha, Chinwendu V, NP   50 mg at 09/04/22 2123   valACYclovir (VALTREX) tablet 500 mg  500 mg Oral Daily Merrily Brittle, DO   500 mg at 09/04/22 [id number]   Current Outpatient Medications  Medication Sig Dispense Refill   ABILIFY MAINTENA 400 MG PRSY prefilled syringe 400 mg every 28 (twenty-eight) days.     cetirizine (ZYRTEC) 10 MG tablet Take 10 mg by mouth daily.     cyclobenzaprine (FLEXERIL) 10 MG tablet Take 1 tablet (10 mg total) by mouth 2 (two) times daily as needed for muscle spasms. 20 tablet 0   fluticasone (FLONASE) 50 MCG/ACT nasal spray Place 1 spray into both nostrils daily.     meloxicam (MOBIC) 15 MG tablet Take 15 mg by mouth daily.     Oxcarbazepine (TRILEPTAL) 300 MG tablet Take 1 tablet (300 mg total) by mouth 2 (two) times daily. 60 tablet 0   QUEtiapine (SEROQUEL) 400 MG tablet Take 1 tablet (400 mg total) by mouth 2 (two) times daily. 60 tablet 0   sertraline  (ZOLOFT) 50 MG tablet Take 3 tablets (150 mg total) by mouth in the morning. 90 tablet 0   traZODone (DESYREL) 100 MG tablet Take 1 tablet (100 mg total) by mouth at bedtime. 30 tablet 0   valACYclovir (VALTREX) 500 MG tablet Take 500 mg by mouth daily.      Labs  Lab Results:  Admission on 07/25/2022  Component Date Value Ref Range Status   SARS Coronavirus 2 by RT PCR 07/25/2022 NEGATIVE  NEGATIVE Final   Comment: (NOTE) SARS-CoV-2 target nucleic acids are NOT DETECTED.  The SARS-CoV-2 RNA is generally detectable in upper respiratory specimens during the acute phase of infection. The lowest concentration of SARS-CoV-2 viral copies this assay can detect is 138 copies/mL. A negative result does not preclude SARS-Cov-2 infection and should not be used as the sole basis for treatment or other patient management decisions. A negative result may occur with  improper specimen collection/handling, submission of specimen other than nasopharyngeal swab, presence of viral mutation(s) within the areas targeted by this assay, and inadequate number of viral copies(<138 copies/mL). A negative result must be combined with clinical observations, patient history, and epidemiological information. The expected result is Negative.  Fact Sheet for Patients:  EntrepreneurPulse.com.au  Fact Sheet for Healthcare Providers:  IncredibleEmployment.be  This test is no                          t yet approved or cleared by the [place] FDA and  has been authorized for detection and/or diagnosis of SARS-CoV-2 by FDA under an Emergency Use Authorization (EUA). This EUA will remain  in effect (meaning this test can be used) for the duration of the COVID-19 declaration under Section 564(b)(1) of the Act, 21 U.S.C.section 360bbb-3(b)(1), unless the authorization is terminated  or revoked sooner.       Influenza A by PCR 07/25/2022 NEGATIVE  NEGATIVE Final   Influenza  B by PCR 07/25/2022  NEGATIVE  NEGATIVE Final   Comment: (NOTE) The Xpert Xpress SARS-CoV-2/FLU/RSV plus assay is intended as an aid in the diagnosis of influenza from Nasopharyngeal swab specimens and should not be used as a sole basis for treatment. Nasal washings and aspirates are unacceptable for Xpert Xpress SARS-CoV-2/FLU/RSV testing.  Fact Sheet for Patients: EntrepreneurPulse.com.au  Fact Sheet for Healthcare Providers: IncredibleEmployment.be  This test is not yet approved or cleared by the [place] FDA and has been authorized for detection and/or diagnosis of SARS-CoV-2 by FDA under an Emergency Use Authorization (EUA). This EUA will remain in effect (meaning this test can be used) for the duration of the COVID-19 declaration under Section 564(b)(1) of the Act, 21 U.S.C. section 360bbb-3(b)(1), unless the authorization is terminated or revoked.  Performed at [facility], [place] [address] [place], [place] [address]    WBC 07/25/2022 8.3  4.0 - 10.5 K/uL Final   RBC 07/25/2022 4.44  3.87 - 5.11 MIL/uL Final   Hemoglobin 07/25/2022 13.7  12.0 - 15.0 g/dL Final   HCT 07/25/2022 40.2  36.0 - 46.0 % Final   MCV 07/25/2022 90.5  80.0 - 100.0 fL Final   MCH 07/25/2022 30.9  26.0 - 34.0 pg Final   MCHC 07/25/2022 34.1  30.0 - 36.0 g/dL Final   RDW 07/25/2022 12.2  11.5 - 15.5 % Final   Platelets 07/25/2022 248  150 - 400 K/uL Final   nRBC 07/25/2022 0.0  0.0 - 0.2 % Final   Neutrophils Relative % 07/25/2022 43  % Final   Neutro Abs 07/25/2022 3.6  1.7 - 7.7 K/uL Final   Lymphocytes Relative 07/25/2022 52  % Final   Lymphs Abs 07/25/2022 4.2 (H)  0.7 - 4.0 K/uL Final   Monocytes Relative 07/25/2022 5  % Final   Monocytes Absolute 07/25/2022 0.4  0.1 - 1.0 K/uL Final   Eosinophils Relative 07/25/2022 0  % Final   Eosinophils Absolute 07/25/2022 0.0  0.0 - 0.5 K/uL Final   Basophils Relative 07/25/2022 0  % Final   Basophils  Absolute 07/25/2022 0.0  0.0 - 0.1 K/uL Final   Immature Granulocytes 07/25/2022 0  % Final   Abs Immature Granulocytes 07/25/2022 0.02  0.00 - 0.07 K/uL Final   Performed at [facility], [place] [address] [place], [place] [address]   Sodium 07/25/2022 138  135 - 145 mmol/L Final   Potassium 07/25/2022 4.0  3.5 - 5.1 mmol/L Final   Chloride 07/25/2022 104  98 - 111 mmol/L Final   CO2 07/25/2022 29  22 - 32 mmol/L Final   Glucose, Bld 07/25/2022 83  70 - 99 mg/dL Final   Glucose reference range applies only to samples taken after fasting for at least 8 hours.   BUN 07/25/2022 11  6 - 20 mg/dL Final   Creatinine, Ser 07/25/2022 0.97  0.44 - 1.00 mg/dL Final   Calcium 07/25/2022 9.2  8.9 - 10.3 mg/dL Final   Total Protein 07/25/2022 7.0  6.5 - 8.1 g/dL Final   Albumin 07/25/2022 3.8  3.5 - 5.0 g/dL Final   AST 07/25/2022 18  15 - 41 U/L Final   ALT 07/25/2022 22  0 - 44 U/L Final   Alkaline Phosphatase 07/25/2022 64  38 - 126 U/L Final   Total Bilirubin 07/25/2022 0.2 (L)  0.3 - 1.2 mg/dL Final   GFR, Estimated 07/25/2022 >60  >60 mL/min Final   Comment: (NOTE) Calculated using the CKD-EPI Creatinine Equation (2021)  Anion gap 07/25/2022 5  5 - 15 Final   Performed at [facility] [address], [place], [place] [address]   POC Amphetamine UR 07/25/2022 None Detected  NONE DETECTED (Cut Off Level 1000 ng/mL) Preliminary   POC Secobarbital (BAR) 07/25/2022 None Detected  NONE DETECTED (Cut Off Level 300 ng/mL) Preliminary   POC Buprenorphine (BUP) 07/25/2022 None Detected  NONE DETECTED (Cut Off Level 10 ng/mL) Preliminary   POC Oxazepam (BZO) 07/25/2022 None Detected  NONE DETECTED (Cut Off Level 300 ng/mL) Preliminary   POC Cocaine UR 07/25/2022 None Detected  NONE DETECTED (Cut Off Level 300 ng/mL) Preliminary   POC Methamphetamine UR 07/25/2022 None Detected  NONE DETECTED (Cut Off Level 1000 ng/mL) Preliminary   POC Morphine 07/25/2022 None Detected  NONE DETECTED (Cut  Off Level 300 ng/mL) Preliminary   POC Methadone UR 07/25/2022 None Detected  NONE DETECTED (Cut Off Level 300 ng/mL) Preliminary   POC Oxycodone UR 07/25/2022 None Detected  NONE DETECTED (Cut Off Level 100 ng/mL) Preliminary   POC Marijuana UR 07/25/2022 None Detected  NONE DETECTED (Cut Off Level 50 ng/mL) Preliminary   SARSCOV2ONAVIRUS 2 AG 07/25/2022 NEGATIVE  NEGATIVE Final   Comment: (NOTE) SARS-CoV-2 antigen NOT DETECTED.   Negative results are presumptive.  Negative results do not preclude SARS-CoV-2 infection and should not be used as the sole basis for treatment or other patient management decisions, including infection  control decisions, particularly in the presence of clinical signs and  symptoms consistent with COVID-19, or in those who have been in contact with the virus.  Negative results must be combined with clinical observations, patient history, and epidemiological information. The expected result is Negative.  Fact Sheet for Patients: HandmadeRecipes.com.cy  Fact Sheet for Healthcare Providers: FuneralLife.at  This test is not yet approved or cleared by the [place] FDA and  has been authorized for detection and/or diagnosis of SARS-CoV-2 by FDA under an Emergency Use Authorization (EUA).  This EUA will remain in effect (meaning this test can be used) for the duration of  the COV                          ID-19 declaration under Section 564(b)(1) of the Act, 21 U.S.C. section 360bbb-3(b)(1), unless the authorization is terminated or revoked sooner.     Cholesterol 07/25/2022 181  0 - 200 mg/dL Final   Triglycerides 07/25/2022 118  <150 mg/dL Final   HDL 07/25/2022 45  >40 mg/dL Final   Total CHOL/HDL Ratio 07/25/2022 4.0  RATIO Final   VLDL 07/25/2022 24  0 - 40 mg/dL Final   LDL Cholesterol 07/25/2022 112 (H)  0 - 99 mg/dL Final   Comment:        Total Cholesterol/HDL:CHD Risk Coronary Heart Disease Risk  Table                     Men   Women  1/2 Average Risk   3.4   3.3  Average Risk       5.0   4.4  2 X Average Risk   9.6   7.1  3 X Average Risk  23.4   11.0        Use the calculated Patient Ratio above and the CHD Risk Table to determine the patient's CHD Risk.        ATP III CLASSIFICATION (LDL):  <100     mg/dL   Optimal  100-129  mg/dL   Near or Above                    Optimal  130-159  mg/dL   Borderline  756-433  mg/dL   High  >295     mg/dL   Very High Performed at [facility], [address], [place], [place] [address]    TSH 07/25/2022 6.668 (H)  0.350 - 4.500 uIU/mL Final   Comment: Performed by a 3rd Generation assay with a functional sensitivity of <=0.01 uIU/mL. Performed at [facility], [address] [place], [place] [address]    Glucose-Capillary 07/26/2022 104 (H)  70 - 99 mg/dL Final   Glucose reference range applies only to samples taken after fasting for at least 8 hours.   T3, Free 07/31/2022 2.3  2.0 - 4.4 pg/mL Final   Comment: (NOTE) Performed At: [facility] [address] [place], [place] [id number] Jolene Schimke MD [id number]    Free T4 07/31/2022 0.60 (L)  0.61 - 1.12 ng/dL Final   Comment: (NOTE) Biotin ingestion may interfere with free T4 tests. If the results are inconsistent with the TSH level, previous test results, or the clinical presentation, then consider biotin interference. If needed, order repeat testing after stopping biotin. Performed at [facility], [address] [place], [place] [address]    Glucose-Capillary 08/29/2022 100 (H)  70 - 99 mg/dL Final   Glucose reference range applies only to samples taken after fasting for at least 8 hours.  Admission on 07/04/2022, Discharged on 07/05/2022  Component Date Value Ref Range Status   Sodium 07/04/2022 142  135 - 145 mmol/L Final   Potassium 07/04/2022 4.4  3.5 - 5.1 mmol/L Final   Chloride 07/04/2022 109  98 - 111 mmol/L Final   CO2  07/04/2022 26  22 - 32 mmol/L Final   Glucose, Bld 07/04/2022 96  70 - 99 mg/dL Final   Glucose reference range applies only to samples taken after fasting for at least 8 hours.   BUN 07/04/2022 15  6 - 20 mg/dL Final   Creatinine, Ser 07/04/2022 0.83  0.44 - 1.00 mg/dL Final   Calcium 76/28/3151 9.3  8.9 - 10.3 mg/dL Final   Total Protein 76/16/0737 7.2  6.5 - 8.1 g/dL Final   Albumin 10/62/6948 3.9  3.5 - 5.0 g/dL Final   AST 54/62/7035 23  15 - 41 U/L Final   ALT 07/04/2022 28  0 - 44 U/L Final   Alkaline Phosphatase 07/04/2022 77  38 - 126 U/L Final   Total Bilirubin 07/04/2022 0.4  0.3 - 1.2 mg/dL Final   GFR, Estimated 07/04/2022 >60  >60 mL/min Final   Comment: (NOTE) Calculated using the CKD-EPI Creatinine Equation (2021)    Anion gap 07/04/2022 7  5 - 15 Final   Performed at [facility], [address], [place], [place] [address]   Alcohol, Ethyl (B) 07/04/2022 <10  <10 mg/dL Final   Comment: (NOTE) Lowest detectable limit for serum alcohol is 10 mg/dL.  For medical purposes only. Performed at [facility], [address] [place], [place] [address]    WBC 07/04/2022 7.0  4.0 - 10.5 K/uL Final   RBC 07/04/2022 4.18  3.87 - 5.11 MIL/uL Final   Hemoglobin 07/04/2022 12.8  12.0 - 15.0 g/dL Final   HCT 37/16/9678 39.1  36.0 - 46.0 % Final   MCV 07/04/2022 93.5  80.0 - 100.0 fL  Final   MCH 07/04/2022 30.6  26.0 - 34.0 pg Final   MCHC 07/04/2022 32.7  30.0 - 36.0 g/dL Final   RDW 07/04/2022 12.9  11.5 - 15.5 % Final   Platelets 07/04/2022 243  150 - 400 K/uL Final   nRBC 07/04/2022 0.0  0.0 - 0.2 % Final   Neutrophils Relative % 07/04/2022 43  % Final   Neutro Abs 07/04/2022 3.0  1.7 - 7.7 K/uL Final   Lymphocytes Relative 07/04/2022 50  % Final   Lymphs Abs 07/04/2022 3.5  0.7 - 4.0 K/uL Final   Monocytes Relative 07/04/2022 7  % Final   Monocytes Absolute 07/04/2022 0.5  0.1 - 1.0 K/uL Final   Eosinophils Relative 07/04/2022 0  %  Final   Eosinophils Absolute 07/04/2022 0.0  0.0 - 0.5 K/uL Final   Basophils Relative 07/04/2022 0  % Final   Basophils Absolute 07/04/2022 0.0  0.0 - 0.1 K/uL Final   Immature Granulocytes 07/04/2022 0  % Final   Abs Immature Granulocytes 07/04/2022 0.01  0.00 - 0.07 K/uL Final   Performed at [facility], [place] [address], [place], [place] [address]   I-stat hCG, quantitative 07/04/2022 <5.0  <5 mIU/mL Final   Comment 3 07/04/2022          Final   Comment:   GEST. AGE      CONC.  (mIU/mL)   <=1 WEEK        5 - 50     2 WEEKS       50 - 500     3 WEEKS       100 - 10,000     4 WEEKS     1,000 - 30,000        FEMALE AND NON-PREGNANT FEMALE:     LESS THAN 5 mIU/mL   Admission on 06/14/2022, Discharged on 06/14/2022  Component Date Value Ref Range Status   Color, UA 06/14/2022 yellow  yellow Final   Clarity, UA 06/14/2022 cloudy (A)  clear Final   Glucose, UA 06/14/2022 negative  negative mg/dL Final   Bilirubin, UA 06/14/2022 negative  negative Final   Ketones, POC UA 06/14/2022 negative  negative mg/dL Final   Spec Grav, UA 06/14/2022 1.025  1.010 - 1.025 Final   Blood, UA 06/14/2022 negative  negative Final   pH, UA 06/14/2022 7.5  5.0 - 8.0 Final   Protein Ur, POC 06/14/2022 =30 (A)  negative mg/dL Final   Urobilinogen, UA 06/14/2022 0.2  0.2 or 1.0 E.U./dL Final   Nitrite, UA 06/14/2022 Negative  Negative Final   Leukocytes, UA 06/14/2022 Negative  Negative Final   Preg Test, Ur 06/14/2022 Negative  Negative Final   Specimen Description 06/14/2022 URINE, CLEAN CATCH   Final   Special Requests 06/14/2022    Final                   Value:NONE Performed at [facility], [place] [address], [place], [place] [address]    Culture 06/14/2022 MULTIPLE SPECIES PRESENT, SUGGEST RECOLLECTION (A)   Final   Report Status 06/14/2022 06/16/2022 FINAL   Final  Admission on 06/07/2022, Discharged on 06/10/2022  Component Date Value Ref Range Status   SARS Coronavirus 2  by RT PCR 06/07/2022 NEGATIVE  NEGATIVE Final   Comment: (NOTE) SARS-CoV-2 target nucleic acids are NOT DETECTED.  The SARS-CoV-2 RNA is generally detectable in upper respiratory specimens during the acute phase of infection. The lowest concentration  of SARS-CoV-2 viral copies this assay can detect is 138 copies/mL. A negative result does not preclude SARS-Cov-2 infection and should not be used as the sole basis for treatment or other patient management decisions. A negative result may occur with  improper specimen collection/handling, submission of specimen other than nasopharyngeal swab, presence of viral mutation(s) within the areas targeted by this assay, and inadequate number of viral copies(<138 copies/mL). A negative result must be combined with clinical observations, patient history, and epidemiological information. The expected result is Negative.  Fact Sheet for Patients:  EntrepreneurPulse.com.au  Fact Sheet for Healthcare Providers:  IncredibleEmployment.be  This test is no                          t yet approved or cleared by the [place] FDA and  has been authorized for detection and/or diagnosis of SARS-CoV-2 by FDA under an Emergency Use Authorization (EUA). This EUA will remain  in effect (meaning this test can be used) for the duration of the COVID-19 declaration under Section 564(b)(1) of the Act, 21 U.S.C.section 360bbb-3(b)(1), unless the authorization is terminated  or revoked sooner.       Influenza A by PCR 06/07/2022 NEGATIVE  NEGATIVE Final   Influenza B by PCR 06/07/2022 NEGATIVE  NEGATIVE Final   Comment: (NOTE) The Xpert Xpress SARS-CoV-2/FLU/RSV plus assay is intended as an aid in the diagnosis of influenza from Nasopharyngeal swab specimens and should not be used as a sole basis for treatment. Nasal washings and aspirates are unacceptable for Xpert Xpress SARS-CoV-2/FLU/RSV testing.  Fact Sheet for  Patients: EntrepreneurPulse.com.au  Fact Sheet for Healthcare Providers: IncredibleEmployment.be  This test is not yet approved or cleared by the [place] FDA and has been authorized for detection and/or diagnosis of SARS-CoV-2 by FDA under an Emergency Use Authorization (EUA). This EUA will remain in effect (meaning this test can be used) for the duration of the COVID-19 declaration under Section 564(b)(1) of the Act, 21 U.S.C. section 360bbb-3(b)(1), unless the authorization is terminated or revoked.  Performed at [facility], [place] [address] [place],  [address]    WBC 06/07/2022 5.7  4.0 - 10.5 K/uL Final   RBC 06/07/2022 4.39  3.87 - 5.11 MIL/uL Final   Hemoglobin 06/07/2022 13.2  12.0 - 15.0 g/dL Final   HCT 06/07/2022 40.4  36.0 - 46.0 % Final   MCV 06/07/2022 92.0  80.0 - 100.0 fL Final   MCH 06/07/2022 30.1  26.0 - 34.0 pg Final   MCHC 06/07/2022 32.7  30.0 - 36.0 g/dL Final   RDW 06/07/2022 12.4  11.5 - 15.5 % Final   Platelets 06/07/2022 308  150 - 400 K/uL Final   nRBC 06/07/2022 0.0  0.0 - 0.2 % Final   Neutrophils Relative % 06/07/2022 42  % Final   Neutro Abs 06/07/2022 2.4  1.7 - 7.7 K/uL Final   Lymphocytes Relative 06/07/2022 54  % Final   Lymphs Abs 06/07/2022 3.1  0.7 - 4.0 K/uL Final   Monocytes Relative 06/07/2022 4  % Final   Monocytes Absolute 06/07/2022 0.2  0.1 - 1.0 K/uL Final   Eosinophils Relative 06/07/2022 0  % Final   Eosinophils Absolute 06/07/2022 0.0  0.0 - 0.5 K/uL Final   Basophils Relative 06/07/2022 0  % Final   Basophils Absolute 06/07/2022 0.0  0.0 - 0.1 K/uL Final   Immature Granulocytes 06/07/2022 0  % Final   Abs Immature Granulocytes  06/07/2022 0.01  0.00 - 0.07 K/uL Final   Performed at [facility], [place] [address] [place], [place] [address]   Sodium 06/07/2022 139  135 - 145 mmol/L Final   Potassium 06/07/2022 4.0  3.5 - 5.1 mmol/L Final   Chloride 06/07/2022 104  98 -  111 mmol/L Final   CO2 06/07/2022 28  22 - 32 mmol/L Final   Glucose, Bld 06/07/2022 104 (H)  70 - 99 mg/dL Final   Glucose reference range applies only to samples taken after fasting for at least 8 hours.   BUN 06/07/2022 8  6 - 20 mg/dL Final   Creatinine, Ser 06/07/2022 0.84  0.44 - 1.00 mg/dL Final   Calcium 06/07/2022 9.1  8.9 - 10.3 mg/dL Final   Total Protein 06/07/2022 6.9  6.5 - 8.1 g/dL Final   Albumin 06/07/2022 3.7  3.5 - 5.0 g/dL Final   AST 06/07/2022 19  15 - 41 U/L Final   ALT 06/07/2022 22  0 - 44 U/L Final   Alkaline Phosphatase 06/07/2022 54  38 - 126 U/L Final   Total Bilirubin 06/07/2022 0.4  0.3 - 1.2 mg/dL Final   GFR, Estimated 06/07/2022 >60  >60 mL/min Final   Comment: (NOTE) Calculated using the CKD-EPI Creatinine Equation (2021)    Anion gap 06/07/2022 7  5 - 15 Final   Performed at [facility] [address], [place], [place] [address]   Hgb A1c MFr Bld 06/07/2022 5.0  4.8 - 5.6 % Final   Comment: (NOTE) Pre diabetes:          5.7%-6.4%  Diabetes:              >6.4%  Glycemic control for   <7.0% adults with diabetes    Mean Plasma Glucose 06/07/2022 96.8  mg/dL Final   Performed at [facility], [place] [address], [place], [place] [address]   TSH 06/07/2022 1.620  0.350 - 4.500 uIU/mL Final   Comment: Performed by a 3rd Generation assay with a functional sensitivity of <=0.01 uIU/mL. Performed at [facility], [place] [address] [place], [place] [address]    RPR Ser Ql 06/07/2022 NON REACTIVE  NON REACTIVE Final   Performed at [facility], [place] [address], [place], [place] [address]   Color, Urine 06/07/2022 YELLOW  YELLOW Final   APPearance 06/07/2022 HAZY (A)  CLEAR Final   Specific Gravity, Urine 06/07/2022 1.018  1.005 - 1.030 Final   pH 06/07/2022 7.0  5.0 - 8.0 Final   Glucose, UA 06/07/2022 NEGATIVE  NEGATIVE mg/dL Final   Hgb urine dipstick 06/07/2022 NEGATIVE  NEGATIVE Final   Bilirubin Urine 06/07/2022 NEGATIVE   NEGATIVE Final   Ketones, ur 06/07/2022 NEGATIVE  NEGATIVE mg/dL Final   Protein, ur 06/07/2022 NEGATIVE  NEGATIVE mg/dL Final   Nitrite 06/07/2022 NEGATIVE  NEGATIVE Final   Leukocytes,Ua 06/07/2022 NEGATIVE  NEGATIVE Final   Performed at [facility], [place] [address] [place], [place] [address]   Cholesterol 06/07/2022 164  0 - 200 mg/dL Final   Triglycerides 06/07/2022 158 (H)  <150 mg/dL Final   HDL 06/07/2022 44  >40 mg/dL Final   Total CHOL/HDL Ratio 06/07/2022 3.7  RATIO Final   VLDL 06/07/2022 32  0 - 40 mg/dL Final   LDL Cholesterol 06/07/2022 88  0 - 99 mg/dL Final   Comment:        Total Cholesterol/HDL:CHD Risk Coronary Heart Disease Risk  Table                     Men   Women  1/2 Average Risk   3.4   3.3  Average Risk       5.0   4.4  2 X Average Risk   9.6   7.1  3 X Average Risk  23.4   11.0        Use the calculated Patient Ratio above and the CHD Risk Table to determine the patient's CHD Risk.        ATP III CLASSIFICATION (LDL):  <100     mg/dL   Optimal  100-129  mg/dL   Near or Above                    Optimal  130-159  mg/dL   Borderline  160-189  mg/dL   High  >190     mg/dL   Very High Performed at [facility] [address] [place], [place] [address]    HIV Screen 4th Generation wRfx 06/07/2022 Non Reactive  Non Reactive Final   Performed at [facility], [place] [address], [place], [place] [address]   SARSCOV2ONAVIRUS 2 AG 06/07/2022 NEGATIVE  NEGATIVE Final   Comment: (NOTE) SARS-CoV-2 antigen NOT DETECTED.   Negative results are presumptive.  Negative results do not preclude SARS-CoV-2 infection and should not be used as the sole basis for treatment or other patient management decisions, including infection  control decisions, particularly in the presence of clinical signs and  symptoms consistent with COVID-19, or in those who have been in contact with the virus.  Negative results must be combined with clinical observations,  patient history, and epidemiological information. The expected result is Negative.  Fact Sheet for Patients: HandmadeRecipes.com.cy  Fact Sheet for Healthcare Providers: FuneralLife.at  This test is not yet approved or cleared by the [place] FDA and  has been authorized for detection and/or diagnosis of SARS-CoV-2 by FDA under an Emergency Use Authorization (EUA).  This EUA will remain in effect (meaning this test can be used) for the duration of  the COV                          ID-19 declaration under Section 564(b)(1) of the Act, 21 U.S.C. section 360bbb-3(b)(1), unless the authorization is terminated or revoked sooner.     POC Amphetamine UR 06/07/2022 None Detected  NONE DETECTED (Cut Off Level 1000 ng/mL) Final   POC Secobarbital (BAR) 06/07/2022 None Detected  NONE DETECTED (Cut Off Level 300 ng/mL) Final   POC Buprenorphine (BUP) 06/07/2022 None Detected  NONE DETECTED (Cut Off Level 10 ng/mL) Final   POC Oxazepam (BZO) 06/07/2022 None Detected  NONE DETECTED (Cut Off Level 300 ng/mL) Final   POC Cocaine UR 06/07/2022 None Detected  NONE DETECTED (Cut Off Level 300 ng/mL) Final   POC Methamphetamine UR 06/07/2022 None Detected  NONE DETECTED (Cut Off Level 1000 ng/mL) Final   POC Morphine 06/07/2022 None Detected  NONE DETECTED (Cut Off Level 300 ng/mL) Final   POC Methadone UR 06/07/2022 None Detected  NONE DETECTED (Cut Off Level 300 ng/mL) Final   POC Oxycodone UR 06/07/2022 None Detected  NONE DETECTED (Cut Off Level 100 ng/mL) Final   POC Marijuana UR 06/07/2022 None Detected  NONE DETECTED (Cut Off Level 50 ng/mL) Final   Preg Test, Ur 06/08/2022 NEGATIVE  NEGATIVE Final  Comment:        THE SENSITIVITY OF THIS METHODOLOGY IS >20 mIU/mL. Performed at [facility], [address] [place], [place] [id number]    Preg Test, Ur 06/08/2022 NEGATIVE  NEGATIVE Final   Comment:        THE SENSITIVITY OF  THIS METHODOLOGY IS >24 mIU/mL     Blood Alcohol level:  Lab Results  Component Value Date   ETH <10 07/04/2022   ETH <10 02/07/2022    Metabolic Disorder Labs: Lab Results  Component Value Date   HGBA1C 5.0 06/07/2022   MPG 96.8 06/07/2022   MPG 96.8 02/07/2022   No results found for: "PROLACTIN" Lab Results  Component Value Date   CHOL 181 07/25/2022   TRIG 118 07/25/2022   HDL 45 07/25/2022   CHOLHDL 4.0 07/25/2022   VLDL 24 07/25/2022   LDLCALC 112 (H) 07/25/2022   LDLCALC 88 06/07/2022    Therapeutic Lab Levels: No results found for: "LITHIUM" No results found for: "VALPROATE" No results found for: "CBMZ"  Physical Findings   GAD-7    Flowsheet Row [facility]  Total GAD-7 Score 17      PHQ2-9    Flow[facility]  PHQ-2 Total Score 1 2  PHQ-9 Total Score 2 8      Flowsheet Row [facility]   C-SSRS RISK CATEGORY High Risk No Risk No Risk        Musculoskeletal  Strength & Muscle Tone: within normal limits Gait & Station: normal Patient leans: N/A  Psychiatric Specialty Exam  Presentation  General Appearance:  Appropriate for Environment; Casual  Eye Contact: Good  Speech: Clear and Coherent; Normal Rate  Speech Volume: Normal  Handedness: Right   Mood and Affect  Mood: Euthymic  Affect: Appropriate; Congruent; Full Range   Thought Process  Thought Processes: Coherent; Goal Directed  Descriptions of Associations:Intact  Orientation:Full (Time, Place and Person)  Thought Content:Logical; Paranoid Ideation (Mostly logical but some paranoid ideation)  Diagnosis  of Schizophrenia or Schizoaffective disorder in past: No    Hallucinations:Hallucinations: None   Ideas of Reference:Paranoia  Suicidal Thoughts:Suicidal Thoughts: No   Homicidal Thoughts:Homicidal Thoughts: No    Sensorium  Memory: Immediate Good; Recent Good  Judgment: Intact  Insight: Fair; Present   Executive Functions  Concentration: Good  Attention Span: Good  Recall: [employer]  Fund of Knowledge: Fair  Language: Fair   Psychomotor Activity  Psychomotor Activity: Psychomotor Activity: Normal    Assets  Assets: Communication Skills; Desire for Improvement   Sleep  Sleep: Sleep: Good     Physical Exam  Physical Exam Vitals and nursing note reviewed.  Constitutional:      Appearance: Normal appearance. She is well-developed.  Cardiovascular:     Rate and Rhythm: Normal rate.  Pulmonary:     Effort: Pulmonary effort is normal.  Musculoskeletal:        General: Normal range of motion.     Cervical back: Normal range of motion.  Skin:    General: Skin is warm and dry.  Neurological:     Mental Status: She is alert and oriented to person, place, and time.  Psychiatric:  Attention and Perception: Attention and perception normal.        Mood and Affect: Mood and affect normal.        Speech: Speech normal.        Behavior: Behavior normal.        Thought Content: Thought content normal.        Cognition and Memory: Cognition normal.    Review of Systems  Constitutional: Negative.   HENT: Negative.    Eyes: Negative.   Respiratory: Negative.    Cardiovascular: Negative.   Gastrointestinal:  Negative for constipation (several BM yesterday), nausea and vomiting. Abdominal pain: Denies. Genitourinary: Negative.   Musculoskeletal: Negative.   Skin: Negative.   Neurological: Negative.   Psychiatric/Behavioral:  Depression: Stable. Hallucinations: Denies. Suicidal ideas: Denies. Nervous/anxious: Stable.    Blood pressure 108/84,  pulse (!) 110, temperature 97.6 F (36.4 C), temperature source Oral, resp. rate 20, SpO2 100 %. There is no height or weight on file to calculate BMI.  Treatment Plan Summary: Daily contact with patient to assess and evaluate symptoms and progress in treatment Patient remains cleared by psychiatry.  Disposition is pending placement, transition of care staff pursuing disposition plan.  Meeting scheduled for 08/30/2022 did not occur.  Social work informs that meeting will need to be rescheduled.    Rosezetta Schlatter, MD 09/04/2022 10:47 PM

## 2022-09-04 NOTE — ED Notes (Signed)
Pt is sleeping. No distress noted. Will continue to monitor safety. 

## 2022-09-04 NOTE — ED Notes (Signed)
Pt is in the bed resting. Respirations are even and unlabored. No acute distress noted. Will continue to monitor for safety 

## 2022-09-04 NOTE — Progress Notes (Signed)
[name] Progress Note  [name] reached out to Elmarie Mainland, [facility] care coordinator via email to discuss disposition and make a motion to schedule a meeting for next week.  [name] asked for [name] to provide a day and time that would work best for her.  [name] spoke with pt earlier in the morning regarding updates and informed her that there have been no updates at this time other than the [facility] she wanted to go to is only funded by [employer] for I/DD clients.  [name] told pt that [name], Interior and spatial designer of [facility] is trying to figure out how she can get funding for mental health supervised living so that she can accept the pt into her [facility].  At this time, there are no new updates on disposition.   Hansel Starling, MSW, [name] [facility] [phone] phone [phone] fax

## 2022-09-05 DIAGNOSIS — F333 Major depressive disorder, recurrent, severe with psychotic symptoms: Secondary | ICD-10-CM | POA: Diagnosis not present

## 2022-09-05 LAB — TSH: TSH: 0.793 u[IU]/mL (ref 0.350–4.500)

## 2022-09-05 MED ORDER — IBUPROFEN 400 MG PO TABS
400.0000 mg | ORAL_TABLET | Freq: Four times a day (QID) | ORAL | Status: DC | PRN
  Administered 2022-09-06 – 2022-09-29 (×5): 400 mg via ORAL
  Filled 2022-09-05 (×5): qty 1

## 2022-09-05 MED ORDER — DOCUSATE SODIUM 100 MG PO CAPS
100.0000 mg | ORAL_CAPSULE | Freq: Every day | ORAL | Status: DC
  Administered 2022-09-05 – 2022-10-07 (×33): 100 mg via ORAL
  Filled 2022-09-05 (×33): qty 1

## 2022-09-05 NOTE — ED Notes (Addendum)
Pt is sleeping. No distress noted. Will continue to monitor safety. 

## 2022-09-05 NOTE — ED Notes (Signed)
Patient was given PRN Tylenol for her pain. She is watching TV at this time. Conversing with other patients. Will continue to monitor for safety

## 2022-09-05 NOTE — ED Notes (Signed)
Patient observed in bed appears asleep, rise and fall of chest noted. Will continue to monitor pt for safety.

## 2022-09-05 NOTE — ED Notes (Signed)
Patient took all her morning meds she did complain of pain that appears to be from her left hip. After receiving her morning meds she no longer complained about the pain. She ate lunch with other patients. Pleasant and talking to other patients. Will continue to monitor for safety.

## 2022-09-05 NOTE — ED Provider Notes (Signed)
Behavioral Health Progress Note  Date and Time: 09/05/2022 1:39 PM Name: Nicole [name] MRN:  [id number]  Subjective:  Nicole [name] is a 31 y.o. female, with PMH of SCZ unspecified, MDD with psychosis, who presented voluntary to [facility] (07/25/2022) via [facility] after a verbal altercation with Nicole [name] (not patient's legal guardian) for transient SI.  This is patient's fourth visit to [facility] for similar concerns this year. Patient has been dismissed from previous group home due to threatening SI and HI, then leaving the group home.   Legal Guardian: Nicole [name]) transitioning to be Nicole [name]) Point of contact: Nicole [name])   Nicole [name], 31 y.o., female patient seen face to face by this provider for psychiatric reassessment, consulted with Dr. Margaretha Seeds; and chart reviewed on 09/05/22.  On evaluation Nicole [name] reports she is feeling well overall in terms of mood. She does endorse physical discomfort, reporting that she has aching pain in her lower abdomen as well as bilateral anterior thighs. She states that she has not started her menstrual cycle this month, which was supposed to start around 11/13. Continues to sleep without difficulty, and her appetite is intact without previously reported nausea. Reports continue to take medications as prescribed with out adverse reaction.    During evaluation Nicole [name] is seen on the unit with no noted distress.  She is alert, oriented x 4, calm, cooperative and attentive.  Her mood is euthymic with congruent affect.  She has normal speech and behavior.  Objectively there is no evidence of psychosis/mania or delusional thinking.  Patient is able to converse coherently, goal directed thoughts, no distractibility, or pre-occupation.  She also denies suicidal/self-harm/homicidal ideation. Patient reported that she has been practicing techniques including deep breathing and prayer for when she feels particularly overwhelmed,  both of which have been helpful in controlling her anxiety.  Patient continues to advocate for herself in trying to figure out placement. She has been in contact with her Community education officer as well as Renato Shin from an ALF, who advised her that either retesting would need to be completed or a letter can be sent on her behalf stating the necessity for Ferrah to be placed in a group home.  Patient also inquired about the possibility of having a video chat with her daughter today.  She was advised that we will assist in setting up the video call, which excited her.  Patient answered questions appropriately.    Diagnosis:  Final diagnoses:  Severe episode of recurrent major depressive disorder, with psychotic features (HCC)    Total Time spent with patient: 20 minutes  Past Psychiatric History: above  Past Medical History:  Past Medical History:  Diagnosis Date   Anxiety    Depression    Hypothyroidism 08/07/2022   Tobacco use disorder 08/07/2022    Past Surgical History:  Procedure Laterality Date   WISDOM TOOTH EXTRACTION Bilateral 2020   Family History:  Family History  Problem Relation Age of Onset   Hypertension Father    Diabetes Father    Family Psychiatric  History: see above Social History:  Social History   Substance and Sexual Activity  Alcohol Use Never     Social History   Substance and Sexual Activity  Drug Use Never    Social History   Socioeconomic History   Marital status: Single    Spouse name: Not on file   Number of children: Not on file   Years of education: Not on file  Highest education level: Not on file  Occupational History   Not on file  Tobacco Use   Smoking status: Every Day    Types: Cigarettes   Smokeless tobacco: Never  Vaping Use   Vaping Use: Some days  Substance and Sexual Activity   Alcohol use: Never   Drug use: Never   Sexual activity: Yes    Partners: Female    Birth control/protection: Implant  Other  Topics Concern   Not on file  Social History Narrative   Not on file   Social Determinants of Health   Financial Resource Strain: Not on file  Food Insecurity: Not on file  Transportation Needs: Not on file  Physical Activity: Not on file  Stress: Not on file  Social Connections: Not on file   SDOH:  SDOH Screenings   Depression (PHQ2-9): Low Risk  (06/10/2022)  Tobacco Use: High Risk (09/01/2022)   Additional Social History:    Pain Medications: SEE MAR Prescriptions: SEE MAR Over the Counter: SEE MAR History of alcohol / drug use?: Yes Longest period of sobriety (when/how long): N/A Negative Consequences of Use:  (NONE) Withdrawal Symptoms: None Name of Substance 1: ETOH IN PAST--PT CURRENTLY DENIES 1 - Frequency: SOCIAL USE ETOH IN PAST 1 - Method of Aquiring: LEGAL 1- Route of Use: ORAL DRINK Name of Substance 2: NICOTINE-VAPE AND CIGARETTES 2 - Amount (size/oz): LESS THAN ONE PACK 2 - Frequency: DAILY 2 - Method of Aquiring: LEGAL 2 - Route of Substance Use: ORAL SMOKE    Sleep: Good  Appetite:  Fair  Current Medications:  Current Facility-Administered Medications  Medication Dose Route Frequency Provider Last Rate Last Admin   acetaminophen (TYLENOL) tablet 650 mg  650 mg Oral Q6H PRN Onuoha, Chinwendu V, NP   650 mg at 08/30/22 1856   alum & mag hydroxide-simeth (MAALOX/MYLANTA) 200-200-20 MG/5ML suspension 30 mL  30 mL Oral Q4H PRN Onuoha, Chinwendu V, NP   30 mL at 07/27/22 2151   ARIPiprazole ER (ABILIFY MAINTENA) injection 400 mg  400 mg Intramuscular Q28 days Princess Bruins, DO   400 mg at 08/30/22 1338   clotrimazole (LOTRIMIN) 1 % cream   Topical BID Bobbitt, Shalon E, NP   1 Application at 09/05/22 1009   hydrOXYzine (ATARAX) tablet 25 mg  25 mg Oral TID PRN Onuoha, Chinwendu V, NP   25 mg at 09/04/22 1846   ibuprofen (ADVIL) tablet 400 mg  400 mg Oral Q6H PRN Princess Bruins, DO   400 mg at 08/30/22 2123   levothyroxine (SYNTHROID) tablet 100 mcg  100  mcg Oral Q0600 Princess Bruins, DO   100 mcg at 09/05/22 0618   ziprasidone (GEODON) injection 20 mg  20 mg Intramuscular Q12H PRN Onuoha, Chinwendu V, NP       And   LORazepam (ATIVAN) tablet 1 mg  1 mg Oral PRN Onuoha, Chinwendu V, NP       nicotine (NICODERM CQ - dosed in mg/24 hours) patch 14 mg  14 mg Transdermal Q0600 Onuoha, Chinwendu V, NP   14 mg at 09/05/22 1007   ondansetron (ZOFRAN-ODT) disintegrating tablet 4 mg  4 mg Oral Q8H PRN Sindy Guadeloupe, NP   4 mg at 08/30/22 2122   Oxcarbazepine (TRILEPTAL) tablet 300 mg  300 mg Oral BID Onuoha, Chinwendu V, NP   300 mg at 09/05/22 1008   pantoprazole (PROTONIX) EC tablet 20 mg  20 mg Oral Daily Princess Bruins, DO   20 mg at 09/05/22 1008  polyethylene glycol (MIRALAX / GLYCOLAX) packet 17 g  17 g Oral Daily PRN Lenard Lance, FNP   17 g at 08/31/22 2205   QUEtiapine (SEROQUEL) tablet 400 mg  400 mg Oral BID Onuoha, Chinwendu V, NP   400 mg at 09/05/22 1008   senna-docusate (Senokot-S) tablet 1 tablet  1 tablet Oral QHS Princess Bruins, DO   1 tablet at 09/04/22 2121   sertraline (ZOLOFT) tablet 150 mg  150 mg Oral Daily Onuoha, Chinwendu V, NP   150 mg at 09/05/22 1008   sodium phosphate (FLEET) 7-19 GM/118ML enema 1 enema  1 enema Rectal Once Rankin, Shuvon B, NP       traZODone (DESYREL) tablet 50 mg  50 mg Oral QHS PRN Onuoha, Chinwendu V, NP   50 mg at 09/04/22 2123   valACYclovir (VALTREX) tablet 500 mg  500 mg Oral Daily Princess Bruins, DO   500 mg at 09/05/22 1009   Current Outpatient Medications  Medication Sig Dispense Refill   ABILIFY MAINTENA 400 MG PRSY prefilled syringe 400 mg every 28 (twenty-eight) days.     cetirizine (ZYRTEC) 10 MG tablet Take 10 mg by mouth daily.     cyclobenzaprine (FLEXERIL) 10 MG tablet Take 1 tablet (10 mg total) by mouth 2 (two) times daily as needed for muscle spasms. 20 tablet 0   fluticasone (FLONASE) 50 MCG/ACT nasal spray Place 1 spray into both nostrils daily.     meloxicam (MOBIC) 15 MG tablet  Take 15 mg by mouth daily.     Oxcarbazepine (TRILEPTAL) 300 MG tablet Take 1 tablet (300 mg total) by mouth 2 (two) times daily. 60 tablet 0   QUEtiapine (SEROQUEL) 400 MG tablet Take 1 tablet (400 mg total) by mouth 2 (two) times daily. 60 tablet 0   sertraline (ZOLOFT) 50 MG tablet Take 3 tablets (150 mg total) by mouth in the morning. 90 tablet 0   traZODone (DESYREL) 100 MG tablet Take 1 tablet (100 mg total) by mouth at bedtime. 30 tablet 0   valACYclovir (VALTREX) 500 MG tablet Take 500 mg by mouth daily.      Labs  Lab Results:  Admission on 07/25/2022  Component Date Value Ref Range Status   SARS Coronavirus 2 by RT PCR 07/25/2022 NEGATIVE  NEGATIVE Final   Comment: (NOTE) SARS-CoV-2 target nucleic acids are NOT DETECTED.  The SARS-CoV-2 RNA is generally detectable in upper respiratory specimens during the acute phase of infection. The lowest concentration of SARS-CoV-2 viral copies this assay can detect is 138 copies/mL. A negative result does not preclude SARS-Cov-2 infection and should not be used as the sole basis for treatment or other patient management decisions. A negative result may occur with  improper specimen collection/handling, submission of specimen other than nasopharyngeal swab, presence of viral mutation(s) within the areas targeted by this assay, and inadequate number of viral copies(<138 copies/mL). A negative result must be combined with clinical observations, patient history, and epidemiological information. The expected result is Negative.  Fact Sheet for Patients:  BloggerCourse.com  Fact Sheet for Healthcare Providers:  SeriousBroker.it  This test is no                          t yet approved or cleared by the [place] FDA and  has been authorized for detection and/or diagnosis of SARS-CoV-2 by FDA under an Emergency Use Authorization (EUA). This EUA will remain  in effect (meaning this test  can be used) for the duration of the COVID-19 declaration under Section 564(b)(1) of the Act, 21 U.S.C.section 360bbb-3(b)(1), unless the authorization is terminated  or revoked sooner.       Influenza A by PCR 07/25/2022 NEGATIVE  NEGATIVE Final   Influenza B by PCR 07/25/2022 NEGATIVE  NEGATIVE Final   Comment: (NOTE) The Xpert Xpress SARS-CoV-2/FLU/RSV plus assay is intended as an aid in the diagnosis of influenza from Nasopharyngeal swab specimens and should not be used as a sole basis for treatment. Nasal washings and aspirates are unacceptable for Xpert Xpress SARS-CoV-2/FLU/RSV testing.  Fact Sheet for Patients: BloggerCourse.com  Fact Sheet for Healthcare Providers: SeriousBroker.it  This test is not yet approved or cleared by the [place] FDA and has been authorized for detection and/or diagnosis of SARS-CoV-2 by FDA under an Emergency Use Authorization (EUA). This EUA will remain in effect (meaning this test can be used) for the duration of the COVID-19 declaration under Section 564(b)(1) of the Act, 21 U.S.C. section 360bbb-3(b)(1), unless the authorization is terminated or revoked.  Performed at [facility], [address] [place], [place] [address]    WBC 07/25/2022 8.3  4.0 - 10.5 K/uL Final   RBC 07/25/2022 4.44  3.87 - 5.11 MIL/uL Final   Hemoglobin 07/25/2022 13.7  12.0 - 15.0 g/dL Final   HCT 72/53/6644 40.2  36.0 - 46.0 % Final   MCV 07/25/2022 90.5  80.0 - 100.0 fL Final   MCH 07/25/2022 30.9  26.0 - 34.0 pg Final   MCHC 07/25/2022 34.1  30.0 - 36.0 g/dL Final   RDW 03/47/4259 12.2  11.5 - 15.5 % Final   Platelets 07/25/2022 248  150 - 400 K/uL Final   nRBC 07/25/2022 0.0  0.0 - 0.2 % Final   Neutrophils Relative % 07/25/2022 43  % Final   Neutro Abs 07/25/2022 3.6  1.7 - 7.7 K/uL Final   Lymphocytes Relative 07/25/2022 52  % Final   Lymphs Abs 07/25/2022 4.2 (H)  0.7 - 4.0 K/uL Final    Monocytes Relative 07/25/2022 5  % Final   Monocytes Absolute 07/25/2022 0.4  0.1 - 1.0 K/uL Final   Eosinophils Relative 07/25/2022 0  % Final   Eosinophils Absolute 07/25/2022 0.0  0.0 - 0.5 K/uL Final   Basophils Relative 07/25/2022 0  % Final   Basophils Absolute 07/25/2022 0.0  0.0 - 0.1 K/uL Final   Immature Granulocytes 07/25/2022 0  % Final   Abs Immature Granulocytes 07/25/2022 0.02  0.00 - 0.07 K/uL Final   Performed at [facility], [address], [place], [place] [address]   Sodium 07/25/2022 138  135 - 145 mmol/L Final   Potassium 07/25/2022 4.0  3.5 - 5.1 mmol/L Final   Chloride 07/25/2022 104  98 - 111 mmol/L Final   CO2 07/25/2022 29  22 - 32 mmol/L Final   Glucose, Bld 07/25/2022 83  70 - 99 mg/dL Final   Glucose reference range applies only to samples taken after fasting for at least 8 hours.   BUN 07/25/2022 11  6 - 20 mg/dL Final   Creatinine, Ser 07/25/2022 0.97  0.44 - 1.00 mg/dL Final   Calcium 56/43/3295 9.2  8.9 - 10.3 mg/dL Final   Total Protein 18/84/1660 7.0  6.5 - 8.1 g/dL Final   Albumin 63/10/6008 3.8  3.5 - 5.0 g/dL Final   AST 93/23/5573 18  15 - 41 U/L Final   ALT 07/25/2022 22  0 -  44 U/L Final   Alkaline Phosphatase 07/25/2022 64  38 - 126 U/L Final   Total Bilirubin 07/25/2022 0.2 (L)  0.3 - 1.2 mg/dL Final   GFR, Estimated 07/25/2022 >60  >60 mL/min Final   Comment: (NOTE) Calculated using the CKD-EPI Creatinine Equation (2021)    Anion gap 07/25/2022 5  5 - 15 Final   Performed at [facility], [address], [place], [place] [address]   POC Amphetamine UR 07/25/2022 None Detected  NONE DETECTED (Cut Off Level 1000 ng/mL) Preliminary   POC Secobarbital (BAR) 07/25/2022 None Detected  NONE DETECTED (Cut Off Level 300 ng/mL) Preliminary   POC Buprenorphine (BUP) 07/25/2022 None Detected  NONE DETECTED (Cut Off Level 10 ng/mL) Preliminary   POC Oxazepam (BZO) 07/25/2022 None Detected  NONE DETECTED (Cut Off Level 300 ng/mL)  Preliminary   POC Cocaine UR 07/25/2022 None Detected  NONE DETECTED (Cut Off Level 300 ng/mL) Preliminary   POC Methamphetamine UR 07/25/2022 None Detected  NONE DETECTED (Cut Off Level 1000 ng/mL) Preliminary   POC Morphine 07/25/2022 None Detected  NONE DETECTED (Cut Off Level 300 ng/mL) Preliminary   POC Methadone UR 07/25/2022 None Detected  NONE DETECTED (Cut Off Level 300 ng/mL) Preliminary   POC Oxycodone UR 07/25/2022 None Detected  NONE DETECTED (Cut Off Level 100 ng/mL) Preliminary   POC Marijuana UR 07/25/2022 None Detected  NONE DETECTED (Cut Off Level 50 ng/mL) Preliminary   SARSCOV2ONAVIRUS 2 AG 07/25/2022 NEGATIVE  NEGATIVE Final   Comment: (NOTE) SARS-CoV-2 antigen NOT DETECTED.   Negative results are presumptive.  Negative results do not preclude SARS-CoV-2 infection and should not be used as the sole basis for treatment or other patient management decisions, including infection  control decisions, particularly in the presence of clinical signs and  symptoms consistent with COVID-19, or in those who have been in contact with the virus.  Negative results must be combined with clinical observations, patient history, and epidemiological information. The expected result is Negative.  Fact Sheet for Patients: https://www.jennings-kim.com/  Fact Sheet for Healthcare Providers: https://alexander-rogers.biz/  This test is not yet approved or cleared by the [place] FDA and  has been authorized for detection and/or diagnosis of SARS-CoV-2 by FDA under an Emergency Use Authorization (EUA).  This EUA will remain in effect (meaning this test can be used) for the duration of  the COV                          ID-19 declaration under Section 564(b)(1) of the Act, 21 U.S.C. section 360bbb-3(b)(1), unless the authorization is terminated or revoked sooner.     Cholesterol 07/25/2022 181  0 - 200 mg/dL Final   Triglycerides 60/45/4098 118  <150 mg/dL  Final   HDL 11/91/4782 45  >40 mg/dL Final   Total CHOL/HDL Ratio 07/25/2022 4.0  RATIO Final   VLDL 07/25/2022 24  0 - 40 mg/dL Final   LDL Cholesterol 07/25/2022 112 (H)  0 - 99 mg/dL Final   Comment:        Total Cholesterol/HDL:CHD Risk Coronary Heart Disease Risk Table                     Men   Women  1/2 Average Risk   3.4   3.3  Average Risk       5.0   4.4  2 X Average Risk   9.6   7.1  3 X Average Risk  23.4   11.0        Use the calculated Patient Ratio above and the CHD Risk Table to determine the patient's CHD Risk.        ATP III CLASSIFICATION (LDL):  <100     mg/dL   Optimal  161-096  mg/dL   Near or Above                    Optimal  130-159  mg/dL   Borderline  045-409  mg/dL   High  >811     mg/dL   Very High Performed at [facility], [address] [place], [place] [address]    TSH 07/25/2022 6.668 (H)  0.350 - 4.500 uIU/mL Final   Comment: Performed by a 3rd Generation assay with a functional sensitivity of <=0.01 uIU/mL. Performed at [facility], [address] [place], [place] [address]    Glucose-Capillary 07/26/2022 104 (H)  70 - 99 mg/dL Final   Glucose reference range applies only to samples taken after fasting for at least 8 hours.   T3, Free 07/31/2022 2.3  2.0 - 4.4 pg/mL Final   Comment: (NOTE) Performed At: [facility] [address] [place], [place] [id number] Jolene Schimke MD ON:[id number]    Free T4 07/31/2022 0.60 (L)  0.61 - 1.12 ng/dL Final   Comment: (NOTE) Biotin ingestion may interfere with free T4 tests. If the results are inconsistent with the TSH level, previous test results, or the clinical presentation, then consider biotin interference. If needed, order repeat testing after stopping biotin. Performed at [facility], [address] [place], [place] [address]    Glucose-Capillary 08/29/2022 100 (H)  70 - 99 mg/dL Final   Glucose reference range applies only to samples taken after fasting for  at least 8 hours.  Admission on 07/04/2022, Discharged on 07/05/2022  Component Date Value Ref Range Status   Sodium 07/04/2022 142  135 - 145 mmol/L Final   Potassium 07/04/2022 4.4  3.5 - 5.1 mmol/L Final   Chloride 07/04/2022 109  98 - 111 mmol/L Final   CO2 07/04/2022 26  22 - 32 mmol/L Final   Glucose, Bld 07/04/2022 96  70 - 99 mg/dL Final   Glucose reference range applies only to samples taken after fasting for at least 8 hours.   BUN 07/04/2022 15  6 - 20 mg/dL Final   Creatinine, Ser 07/04/2022 0.83  0.44 - 1.00 mg/dL Final   Calcium 27/25/3664 9.3  8.9 - 10.3 mg/dL Final   Total Protein 40/34/7425 7.2  6.5 - 8.1 g/dL Final   Albumin 95/63/8756 3.9  3.5 - 5.0 g/dL Final   AST 43/32/9518 23  15 - 41 U/L Final   ALT 07/04/2022 28  0 - 44 U/L Final   Alkaline Phosphatase 07/04/2022 77  38 - 126 U/L Final   Total Bilirubin 07/04/2022 0.4  0.3 - 1.2 mg/dL Final   GFR, Estimated 07/04/2022 >60  >60 mL/min Final   Comment: (NOTE) Calculated using the CKD-EPI Creatinine Equation (2021)    Anion gap 07/04/2022 7  5 - 15 Final   Performed at [facility], [address] [place], [place] [address]   Alcohol, Ethyl (B) 07/04/2022 <10  <10 mg/dL Final   Comment: (NOTE) Lowest detectable limit for serum alcohol is 10 mg/dL.  For medical purposes only. Performed at [facility], [address] [place], [place] [address]  WBC 07/04/2022 7.0  4.0 - 10.5 K/uL Final   RBC 07/04/2022 4.18  3.87 - 5.11 MIL/uL Final   Hemoglobin 07/04/2022 12.8  12.0 - 15.0 g/dL Final   HCT 16/07/9603 39.1  36.0 - 46.0 % Final   MCV 07/04/2022 93.5  80.0 - 100.0 fL Final   MCH 07/04/2022 30.6  26.0 - 34.0 pg Final   MCHC 07/04/2022 32.7  30.0 - 36.0 g/dL Final   RDW 54/06/8118 12.9  11.5 - 15.5 % Final   Platelets 07/04/2022 243  150 - 400 K/uL Final   nRBC 07/04/2022 0.0  0.0 - 0.2 % Final   Neutrophils Relative % 07/04/2022 43  % Final   Neutro Abs 07/04/2022  3.0  1.7 - 7.7 K/uL Final   Lymphocytes Relative 07/04/2022 50  % Final   Lymphs Abs 07/04/2022 3.5  0.7 - 4.0 K/uL Final   Monocytes Relative 07/04/2022 7  % Final   Monocytes Absolute 07/04/2022 0.5  0.1 - 1.0 K/uL Final   Eosinophils Relative 07/04/2022 0  % Final   Eosinophils Absolute 07/04/2022 0.0  0.0 - 0.5 K/uL Final   Basophils Relative 07/04/2022 0  % Final   Basophils Absolute 07/04/2022 0.0  0.0 - 0.1 K/uL Final   Immature Granulocytes 07/04/2022 0  % Final   Abs Immature Granulocytes 07/04/2022 0.01  0.00 - 0.07 K/uL Final   Performed at [facility], [address], [place], [place] [address]   I-stat hCG, quantitative 07/04/2022 <5.0  <5 mIU/mL Final   Comment 3 07/04/2022          Final   Comment:   GEST. AGE      CONC.  (mIU/mL)   <=1 WEEK        5 - 50     2 WEEKS       50 - 500     3 WEEKS       100 - 10,000     4 WEEKS     1,000 - 30,000        FEMALE AND NON-PREGNANT FEMALE:     LESS THAN 5 mIU/mL   Admission on 06/14/2022, Discharged on 06/14/2022  Component Date Value Ref Range Status   Color, UA 06/14/2022 yellow  yellow Final   Clarity, UA 06/14/2022 cloudy (A)  clear Final   Glucose, UA 06/14/2022 negative  negative mg/dL Final   Bilirubin, UA 95/62/1308 negative  negative Final   Ketones, POC UA 06/14/2022 negative  negative mg/dL Final   Spec Grav, UA 65/78/4696 1.025  1.010 - 1.025 Final   Blood, UA 06/14/2022 negative  negative Final   pH, UA 06/14/2022 7.5  5.0 - 8.0 Final   Protein Ur, POC 06/14/2022 =30 (A)  negative mg/dL Final   Urobilinogen, UA 06/14/2022 0.2  0.2 or 1.0 E.U./dL Final   Nitrite, UA 29/52/8413 Negative  Negative Final   Leukocytes, UA 06/14/2022 Negative  Negative Final   Preg Test, Ur 06/14/2022 Negative  Negative Final   Specimen Description 06/14/2022 URINE, CLEAN CATCH   Final   Special Requests 06/14/2022    Final                   Value:NONE Performed at [facility], [address]  [place], [place] [address]    Culture 06/14/2022 MULTIPLE SPECIES PRESENT, SUGGEST RECOLLECTION (A)   Final   Report Status 06/14/2022 06/16/2022 FINAL   Final  Admission on 06/07/2022, Discharged on  06/10/2022  Component Date Value Ref Range Status   SARS Coronavirus 2 by RT PCR 06/07/2022 NEGATIVE  NEGATIVE Final   Comment: (NOTE) SARS-CoV-2 target nucleic acids are NOT DETECTED.  The SARS-CoV-2 RNA is generally detectable in upper respiratory specimens during the acute phase of infection. The lowest concentration of SARS-CoV-2 viral copies this assay can detect is 138 copies/mL. A negative result does not preclude SARS-Cov-2 infection and should not be used as the sole basis for treatment or other patient management decisions. A negative result may occur with  improper specimen collection/handling, submission of specimen other than nasopharyngeal swab, presence of viral mutation(s) within the areas targeted by this assay, and inadequate number of viral copies(<138 copies/mL). A negative result must be combined with clinical observations, patient history, and epidemiological information. The expected result is Negative.  Fact Sheet for Patients:  BloggerCourse.com  Fact Sheet for Healthcare Providers:  SeriousBroker.it  This test is no                          t yet approved or cleared by the [place] FDA and  has been authorized for detection and/or diagnosis of SARS-CoV-2 by FDA under an Emergency Use Authorization (EUA). This EUA will remain  in effect (meaning this test can be used) for the duration of the COVID-19 declaration under Section 564(b)(1) of the Act, 21 U.S.C.section 360bbb-3(b)(1), unless the authorization is terminated  or revoked sooner.       Influenza A by PCR 06/07/2022 NEGATIVE  NEGATIVE Final   Influenza B by PCR 06/07/2022 NEGATIVE  NEGATIVE Final   Comment: (NOTE) The Xpert Xpress SARS-CoV-2/FLU/RSV  plus assay is intended as an aid in the diagnosis of influenza from Nasopharyngeal swab specimens and should not be used as a sole basis for treatment. Nasal washings and aspirates are unacceptable for Xpert Xpress SARS-CoV-2/FLU/RSV testing.  Fact Sheet for Patients: BloggerCourse.com  Fact Sheet for Healthcare Providers: SeriousBroker.it  This test is not yet approved or cleared by the [place] FDA and has been authorized for detection and/or diagnosis of SARS-CoV-2 by FDA under an Emergency Use Authorization (EUA). This EUA will remain in effect (meaning this test can be used) for the duration of the COVID-19 declaration under Section 564(b)(1) of the Act, 21 U.S.C. section 360bbb-3(b)(1), unless the authorization is terminated or revoked.  Performed at [facility], [address], [place], [place] [address]    WBC 06/07/2022 5.7  4.0 - 10.5 K/uL Final   RBC 06/07/2022 4.39  3.87 - 5.11 MIL/uL Final   Hemoglobin 06/07/2022 13.2  12.0 - 15.0 g/dL Final   HCT 29/56/2130 40.4  36.0 - 46.0 % Final   MCV 06/07/2022 92.0  80.0 - 100.0 fL Final   MCH 06/07/2022 30.1  26.0 - 34.0 pg Final   MCHC 06/07/2022 32.7  30.0 - 36.0 g/dL Final   RDW 86/57/8469 12.4  11.5 - 15.5 % Final   Platelets 06/07/2022 308  150 - 400 K/uL Final   nRBC 06/07/2022 0.0  0.0 - 0.2 % Final   Neutrophils Relative % 06/07/2022 42  % Final   Neutro Abs 06/07/2022 2.4  1.7 - 7.7 K/uL Final   Lymphocytes Relative 06/07/2022 54  % Final   Lymphs Abs 06/07/2022 3.1  0.7 - 4.0 K/uL Final   Monocytes Relative 06/07/2022 4  % Final   Monocytes Absolute 06/07/2022 0.2  0.1 - 1.0 K/uL Final   Eosinophils Relative  06/07/2022 0  % Final   Eosinophils Absolute 06/07/2022 0.0  0.0 - 0.5 K/uL Final   Basophils Relative 06/07/2022 0  % Final   Basophils Absolute 06/07/2022 0.0  0.0 - 0.1 K/uL Final   Immature Granulocytes 06/07/2022 0  % Final   Abs Immature  Granulocytes 06/07/2022 0.01  0.00 - 0.07 K/uL Final   Performed at [facility], [address], [place], [place] [address]   Sodium 06/07/2022 139  135 - 145 mmol/L Final   Potassium 06/07/2022 4.0  3.5 - 5.1 mmol/L Final   Chloride 06/07/2022 104  98 - 111 mmol/L Final   CO2 06/07/2022 28  22 - 32 mmol/L Final   Glucose, Bld 06/07/2022 104 (H)  70 - 99 mg/dL Final   Glucose reference range applies only to samples taken after fasting for at least 8 hours.   BUN 06/07/2022 8  6 - 20 mg/dL Final   Creatinine, Ser 06/07/2022 0.84  0.44 - 1.00 mg/dL Final   Calcium 10/16/7251 9.1  8.9 - 10.3 mg/dL Final   Total Protein 66/44/0347 6.9  6.5 - 8.1 g/dL Final   Albumin 42/59/5638 3.7  3.5 - 5.0 g/dL Final   AST 75/64/3329 19  15 - 41 U/L Final   ALT 06/07/2022 22  0 - 44 U/L Final   Alkaline Phosphatase 06/07/2022 54  38 - 126 U/L Final   Total Bilirubin 06/07/2022 0.4  0.3 - 1.2 mg/dL Final   GFR, Estimated 06/07/2022 >60  >60 mL/min Final   Comment: (NOTE) Calculated using the CKD-EPI Creatinine Equation (2021)    Anion gap 06/07/2022 7  5 - 15 Final   Performed at [facility], [address], [place], [place] [address]   Hgb A1c MFr Bld 06/07/2022 5.0  4.8 - 5.6 % Final   Comment: (NOTE) Pre diabetes:          5.7%-6.4%  Diabetes:              >6.4%  Glycemic control for   <7.0% adults with diabetes    Mean Plasma Glucose 06/07/2022 96.8  mg/dL Final   Performed at [facility], [address] [place], [place] [address]   TSH 06/07/2022 1.620  0.350 - 4.500 uIU/mL Final   Comment: Performed by a 3rd Generation assay with a functional sensitivity of <=0.01 uIU/mL. Performed at [facility], [address], [place], [place] [address]    RPR Ser Ql 06/07/2022 NON REACTIVE  NON REACTIVE Final   Performed at [facility], [address] [place], [place] [address]   Color, Urine 06/07/2022 YELLOW  YELLOW Final   APPearance 06/07/2022 HAZY (A)  CLEAR  Final   Specific Gravity, Urine 06/07/2022 1.018  1.005 - 1.030 Final   pH 06/07/2022 7.0  5.0 - 8.0 Final   Glucose, UA 06/07/2022 NEGATIVE  NEGATIVE mg/dL Final   Hgb urine dipstick 06/07/2022 NEGATIVE  NEGATIVE Final   Bilirubin Urine 06/07/2022 NEGATIVE  NEGATIVE Final   Ketones, ur 06/07/2022 NEGATIVE  NEGATIVE mg/dL Final   Protein, ur 35/57/3220 NEGATIVE  NEGATIVE mg/dL Final   Nitrite 25/42/7062 NEGATIVE  NEGATIVE Final   Leukocytes,Ua 06/07/2022 NEGATIVE  NEGATIVE Final   Performed at [facility], [address] [place], [place] [address]   Cholesterol 06/07/2022 164  0 - 200 mg/dL Final   Triglycerides 31/51/7616 158 (H)  <150 mg/dL Final   HDL 07/37/1062 44  >40  mg/dL Final   Total CHOL/HDL Ratio 06/07/2022 3.7  RATIO Final   VLDL 06/07/2022 32  0 - 40 mg/dL Final   LDL Cholesterol 06/07/2022 88  0 - 99 mg/dL Final   Comment:        Total Cholesterol/HDL:CHD Risk Coronary Heart Disease Risk Table                     Men   Women  1/2 Average Risk   3.4   3.3  Average Risk       5.0   4.4  2 X Average Risk   9.6   7.1  3 X Average Risk  23.4   11.0        Use the calculated Patient Ratio above and the CHD Risk Table to determine the patient's CHD Risk.        ATP III CLASSIFICATION (LDL):  <100     mg/dL   Optimal  937-342  mg/dL   Near or Above                    Optimal  130-159  mg/dL   Borderline  876-811  mg/dL   High  >572     mg/dL   Very High Performed at [facility], [address], [place], [place] [address]    HIV Screen 4th Generation wRfx 06/07/2022 Non Reactive  Non Reactive Final   Performed at [facility], [address] [place], [place] [address]   SARSCOV2ONAVIRUS 2 AG 06/07/2022 NEGATIVE  NEGATIVE Final   Comment: (NOTE) SARS-CoV-2 antigen NOT DETECTED.   Negative results are presumptive.  Negative results do not preclude SARS-CoV-2 infection and should not be used as the sole basis for treatment or other patient  management decisions, including infection  control decisions, particularly in the presence of clinical signs and  symptoms consistent with COVID-19, or in those who have been in contact with the virus.  Negative results must be combined with clinical observations, patient history, and epidemiological information. The expected result is Negative.  Fact Sheet for Patients: https://www.jennings-kim.com/  Fact Sheet for Healthcare Providers: https://alexander-rogers.biz/  This test is not yet approved or cleared by the [place] FDA and  has been authorized for detection and/or diagnosis of SARS-CoV-2 by FDA under an Emergency Use Authorization (EUA).  This EUA will remain in effect (meaning this test can be used) for the duration of  the COV                          ID-19 declaration under Section 564(b)(1) of the Act, 21 U.S.C. section 360bbb-3(b)(1), unless the authorization is terminated or revoked sooner.     POC Amphetamine UR 06/07/2022 None Detected  NONE DETECTED (Cut Off Level 1000 ng/mL) Final   POC Secobarbital (BAR) 06/07/2022 None Detected  NONE DETECTED (Cut Off Level 300 ng/mL) Final   POC Buprenorphine (BUP) 06/07/2022 None Detected  NONE DETECTED (Cut Off Level 10 ng/mL) Final   POC Oxazepam (BZO) 06/07/2022 None Detected  NONE DETECTED (Cut Off Level 300 ng/mL) Final   POC Cocaine UR 06/07/2022 None Detected  NONE DETECTED (Cut Off Level 300 ng/mL) Final   POC Methamphetamine UR 06/07/2022 None Detected  NONE DETECTED (Cut Off Level 1000 ng/mL) Final   POC Morphine 06/07/2022 None Detected  NONE DETECTED (Cut Off Level 300 ng/mL) Final   POC Methadone UR 06/07/2022 None Detected  NONE DETECTED (Cut Off Level 300 ng/mL) Final   POC Oxycodone UR 06/07/2022 None Detected  NONE DETECTED (Cut Off Level 100 ng/mL) Final   POC Marijuana UR 06/07/2022 None Detected  NONE DETECTED (Cut Off Level 50 ng/mL) Final   Preg Test, Ur 06/08/2022 NEGATIVE   NEGATIVE Final   Comment:        THE SENSITIVITY OF THIS METHODOLOGY IS >20 mIU/mL. Performed at [facility], [address] [place], [place] [address]    Preg Test, Ur 06/08/2022 NEGATIVE  NEGATIVE Final   Comment:        THE SENSITIVITY OF THIS METHODOLOGY IS >24 mIU/mL     Blood Alcohol level:  Lab Results  Component Value Date   ETH <10 07/04/2022   ETH <10 02/07/2022    Metabolic Disorder Labs: Lab Results  Component Value Date   HGBA1C 5.0 06/07/2022   MPG 96.8 06/07/2022   MPG 96.8 02/07/2022   No results found for: "PROLACTIN" Lab Results  Component Value Date   CHOL 181 07/25/2022   TRIG 118 07/25/2022   HDL 45 07/25/2022   CHOLHDL 4.0 07/25/2022   VLDL 24 07/25/2022   LDLCALC 112 (H) 07/25/2022   LDLCALC 88 06/07/2022    Therapeutic Lab Levels: No results found for: "LITHIUM" No results found for: "VALPROATE" No results found for: "CBMZ"  Physical Findings   GAD-7    Flowsheet Row [facility]17/[facility]  Total GAD-7 Score 17      PHQ2-9    Flowsheet Row ED from 06/07/2022 in [facility] Office Visit from 12/17/2021 [facility]  PHQ-2 Total Score 1 2  PHQ-9 Total Score 2 8      Flowsheet Row ED from 07/25/2022 in [facility] ED from 07[facility]04/2022 in [facility] from 06/14/2022 in [facility]   C-SSRS RISK CATEGORY High Risk No Risk No Risk        Musculoskeletal  Strength & Muscle Tone: within normal limits Gait & Station: normal Patient leans: N/A  Psychiatric Specialty Exam  Presentation  General Appearance:  Appropriate for Environment; Casual  Eye Contact: Good  Speech: Clear and Coherent; Normal Rate  Speech Volume: Normal  Handedness: Right   Mood and Affect  Mood: Euthymic  Affect: Appropriate; Congruent; Full  Range   Thought Process  Thought Processes: Coherent; Goal Directed  Descriptions of Associations:Intact  Orientation:Full (Time, Place and Person)  Thought Content:Logical; Paranoid Ideation (Mostly logical but some paranoid ideation)  Diagnosis of Schizophrenia or Schizoaffective disorder in past: No    Hallucinations:Hallucinations: None   Ideas of Reference:Paranoia  Suicidal Thoughts:Suicidal Thoughts: No   Homicidal Thoughts:Homicidal Thoughts: No    Sensorium  Memory: Immediate Good; Recent Good  Judgment: Intact  Insight: Fair; Present   Executive Functions  Concentration: Good  Attention Span: Good  Recall: [employer] of Knowledge: Fair  Language: Fair   Psychomotor Activity  Psychomotor Activity: Psychomotor Activity: Normal    Assets  Assets: Communication Skills; Desire for Improvement   Sleep  Sleep: Sleep: Good     Physical Exam  Physical Exam Vitals and nursing note reviewed.  Constitutional:      Appearance: Normal appearance. She is well-developed.  Cardiovascular:     Rate and Rhythm: Normal rate.  Pulmonary:     Effort: Pulmonary effort is normal.  Musculoskeletal:        General: Normal range of motion.     Cervical back: Normal range of motion.  Skin:    General: Skin is warm and dry.  Neurological:     Mental Status: She is alert and oriented to person, place, and time.  Psychiatric:        Attention and Perception: Attention and perception normal.        Mood and Affect: Mood and affect normal.        Speech: Speech normal.        Behavior: Behavior normal.        Thought Content: Thought content normal.        Cognition and Memory: Cognition normal.    Review of Systems  Constitutional: Negative.   HENT: Negative.    Eyes: Negative.   Respiratory: Negative.    Cardiovascular: Negative.   Gastrointestinal:  Negative for constipation (several BM yesterday), nausea and vomiting. Abdominal pain:  Denies. Genitourinary: Negative.   Musculoskeletal: Negative.   Skin: Negative.   Neurological: Negative.   Psychiatric/Behavioral:  Depression: Stable. Hallucinations: Denies. Suicidal ideas: Denies. Nervous/anxious: Stable.    Blood pressure 114/77, pulse (!) 111, temperature 98.7 F (37.1 C), temperature source Oral, resp. rate 18, SpO2 100 %. There is no height or weight on file to calculate BMI.  Treatment Plan Summary: Daily contact with patient to assess and evaluate symptoms and progress in treatment Patient remains cleared by psychiatry.  Disposition is pending placement, transition of care staff pursuing disposition plan.  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  Lamar Sprinklesourtney Alianys Chacko, MD 09/05/2022 1:39 PM

## 2022-09-05 NOTE — ED Notes (Signed)
Patient is resting in bed with eyes closed, snoring loudly. Respirations even and unlabored. No S/S/ of distress will continue to monitor for safety.

## 2022-09-05 NOTE — ED Notes (Signed)
Nicole [name] is in room with [place] the social worker making a phone call to her family

## 2022-09-05 NOTE — Progress Notes (Signed)
[name] Progress Note   [name] accompanied Dr. Alfonse Flavors to meet with pt to answer any questions and concerns she has regarding her disposition.  [name] listened carefully and provided what information there was.  [name] informed the pt of not being knowledgeable in what Medicaid and Medicare will and will not cover when it comes to group homes and residential placements. [name] provided creative therapeutic activities for her to do while she is on the unit.  [name] will pass on to the night-time [name] about arranging a video call so pt can speak with her family after her father gets off of work.   Hansel Starling, MSW, [name] [facility] [phone] phone [phone] fax

## 2022-09-06 DIAGNOSIS — F333 Major depressive disorder, recurrent, severe with psychotic symptoms: Secondary | ICD-10-CM | POA: Diagnosis not present

## 2022-09-06 LAB — POCT PREGNANCY, URINE: Preg Test, Ur: NEGATIVE

## 2022-09-06 LAB — T4, FREE: Free T4: 0.73 ng/dL (ref 0.61–1.12)

## 2022-09-06 LAB — POC URINE PREG, ED: Preg Test, Ur: NEGATIVE

## 2022-09-06 MED ORDER — SIMETHICONE 80 MG PO CHEW
80.0000 mg | CHEWABLE_TABLET | Freq: Once | ORAL | Status: AC
  Administered 2022-09-06: 80 mg via ORAL
  Filled 2022-09-06: qty 1

## 2022-09-06 MED ORDER — POLYETHYLENE GLYCOL 3350 17 G PO PACK
17.0000 g | PACK | Freq: Once | ORAL | Status: AC
  Administered 2022-09-06: 17 g via ORAL
  Filled 2022-09-06: qty 1

## 2022-09-06 NOTE — ED Notes (Signed)
Patient resting with no sxs of distress noted - will continue to monitor

## 2022-09-06 NOTE — Progress Notes (Signed)
[facility] completed a call last night with the client's daughter on facetime around 7:45pm for approximately 30 minutes.  The call went good and no issues were present.   [name]  [facility]

## 2022-09-06 NOTE — ED Notes (Signed)
Pt sleeping@this time. Breathing even and unlabored. Will continue to monitor for safety 

## 2022-09-06 NOTE — ED Notes (Signed)
MD informed outstanding lab for T4. Lab called and reported would add it on to blood work sent yesterday. Lab then called back and reported gold tube would not be able to run T4. Per lab draw sheet it does indicate gold tube for this draw. Called back and informed this may be sent in green tube. Specimen redrawan and sent to lab. Please note for delays.

## 2022-09-06 NOTE — ED Provider Notes (Addendum)
Behavioral Health Progress Note  Date and Time: 09/06/2022 4:07 PM Name: Nicole [name] MRN:  [id number]  Subjective:  Nicole [name] is a 31 y.o. female, with PMH of SCZ unspecified, MDD with psychosis, who presented voluntary to [facility] (07/25/2022) via [facility] after a verbal altercation with Nicole [name] (not patient's legal guardian) for transient SI.  This is patient's fourth visit to [facility] for similar concerns this year. Patient has been dismissed from previous group home due to threatening SI and HI, then leaving the group home.   Legal Guardian: [name], 81 y.o., female patient seen face to face by this provider for psychiatric reassessment, consulted with Dr. Margaretha Seeds; and chart reviewed on 09/06/22.  On evaluation Nicole [name] reports she is feeling well overall in terms of mood. She was able to video chat with her family yesterday, which she appreciated. She continues to endorse physical discomfort, reporting aching pain across her entire lower back today. She has not had a BM since 11/22, and does not recall flatulence. Continues to sleep without difficulty, and her appetite is intact without previously reported nausea. Reports continue to take medications as prescribed with out adverse reaction.    During evaluation Nicole [name] is seen on the unit with no noted distress.  She is alert, oriented x 4, calm, cooperative and attentive.  Her mood is euthymic with congruent affect.  She has normal speech and behavior.  Objectively there is no evidence of psychosis/mania or delusional thinking.  Patient is able to converse coherently, goal directed thoughts, no distractibility, or pre-occupation.  She also denies suicidal/self-harm/homicidal ideation. Patient reported that she has been practicing techniques including deep breathing and prayer for when she feels particularly  overwhelmed, both of which have been helpful in controlling her anxiety.  Patient continues to advocate for herself in trying to figure out placement. She also inquired about the lack of therapy on the unit. Her questions were answered, and she reported no further concerns.   Diagnosis:  Final diagnoses:  Severe episode of recurrent major depressive disorder, with psychotic features (HCC)    Total Time spent with patient: 20 minutes  Past Psychiatric History: above  Past Medical History:  Past Medical History:  Diagnosis Date   Anxiety    Depression    Hypothyroidism 08/07/2022   Tobacco use disorder 08/07/2022    Past Surgical History:  Procedure Laterality Date   WISDOM TOOTH EXTRACTION Bilateral 2020   Family History:  Family History  Problem Relation Age of Onset   Hypertension Father    Diabetes Father    Family Psychiatric  History: see above Social History:  Social History   Substance and Sexual Activity  Alcohol Use Never     Social History   Substance and Sexual Activity  Drug Use Never    Social History   Socioeconomic History   Marital status: Single    Spouse name: Not on file   Number of children: Not on file   Years of education: Not on file   Highest education level: Not on file  Occupational History   Not on file  Tobacco Use   Smoking status: Every Day    Types: Cigarettes   Smokeless tobacco: Never  Vaping Use   Vaping Use: Some days  Substance and Sexual Activity   Alcohol use: Never   Drug use: Never   Sexual activity: Yes  Partners: Female    Birth control/protection: Implant  Other Topics Concern   Not on file  Social History Narrative   Not on file   Social Determinants of Health   Financial Resource Strain: Not on file  Food Insecurity: Not on file  Transportation Needs: Not on file  Physical Activity: Not on file  Stress: Not on file  Social Connections: Not on file   SDOH:  SDOH Screenings   Depression  (PHQ2-9): Low Risk  (06/10/2022)  Tobacco Use: High Risk (09/01/2022)   Additional Social History:    Pain Medications: SEE MAR Prescriptions: SEE MAR Over the Counter: SEE MAR History of alcohol / drug use?: Yes Longest period of sobriety (when/how long): N/A Negative Consequences of Use:  (NONE) Withdrawal Symptoms: None Name of Substance 1: ETOH IN PAST--PT CURRENTLY DENIES 1 - Frequency: SOCIAL USE ETOH IN PAST 1 - Method of Aquiring: LEGAL 1- Route of Use: ORAL DRINK Name of Substance 2: NICOTINE-VAPE AND CIGARETTES 2 - Amount (size/oz): LESS THAN ONE PACK 2 - Frequency: DAILY 2 - Method of Aquiring: LEGAL 2 - Route of Substance Use: ORAL SMOKE    Sleep: Good  Appetite:  Fair  Current Medications:  Current Facility-Administered Medications  Medication Dose Route Frequency Provider Last Rate Last Admin   acetaminophen (TYLENOL) tablet 650 mg  650 mg Oral Q6H PRN Onuoha, Chinwendu V, NP   650 mg at 09/05/22 2219   alum & mag hydroxide-simeth (MAALOX/MYLANTA) 200-200-20 MG/5ML suspension 30 mL  30 mL Oral Q4H PRN Onuoha, Chinwendu V, NP   30 mL at 07/27/22 2151   ARIPiprazole ER (ABILIFY MAINTENA) injection 400 mg  400 mg Intramuscular Q28 days Princess Bruins, DO   400 mg at 08/30/22 1338   clotrimazole (LOTRIMIN) 1 % cream   Topical BID Bobbitt, Shalon E, NP   1 Application at 09/05/22 1009   docusate sodium (COLACE) capsule 100 mg  100 mg Oral Daily [name], MD   100 mg at 09/06/22 0917   hydrOXYzine (ATARAX) tablet 25 mg  25 mg Oral TID PRN Onuoha, Chinwendu V, NP   25 mg at 09/04/22 1846   ibuprofen (ADVIL) tablet 400 mg  400 mg Oral Q6H PRN Onuoha, Chinwendu V, NP   400 mg at 09/06/22 1314   levothyroxine (SYNTHROID) tablet 100 mcg  100 mcg Oral Q0600 Princess Bruins, DO   100 mcg at 09/06/22 0647   ziprasidone (GEODON) injection 20 mg  20 mg Intramuscular Q12H PRN Onuoha, Chinwendu V, NP       And   LORazepam (ATIVAN) tablet 1 mg  1 mg Oral PRN Onuoha, Chinwendu V,  NP       nicotine (NICODERM CQ - dosed in mg/24 hours) patch 14 mg  14 mg Transdermal Q0600 Onuoha, Chinwendu V, NP   14 mg at 09/06/22 0918   ondansetron (ZOFRAN-ODT) disintegrating tablet 4 mg  4 mg Oral Q8H PRN Sindy Guadeloupe, NP   4 mg at 08/30/22 2122   Oxcarbazepine (TRILEPTAL) tablet 300 mg  300 mg Oral BID Onuoha, Chinwendu V, NP   300 mg at 09/06/22 0917   pantoprazole (PROTONIX) EC tablet 20 mg  20 mg Oral Daily Princess Bruins, DO   20 mg at 09/06/22 0917   polyethylene glycol (MIRALAX / GLYCOLAX) packet 17 g  17 g Oral Once [name], MD       QUEtiapine (SEROQUEL) tablet 400 mg  400 mg Oral BID Onuoha, Chinwendu V, NP   400 mg  at 09/06/22 0917   sertraline (ZOLOFT) tablet 150 mg  150 mg Oral Daily Onuoha, Chinwendu V, NP   150 mg at 09/06/22 0454   simethicone (MYLICON) chewable tablet 80 mg  80 mg Oral Once [name], MD       sodium phosphate (FLEET) 7-19 GM/118ML enema 1 enema  1 enema Rectal Once Rankin, Shuvon B, NP       traZODone (DESYREL) tablet 50 mg  50 mg Oral QHS PRN Onuoha, Chinwendu V, NP   50 mg at 09/05/22 2219   valACYclovir (VALTREX) tablet 500 mg  500 mg Oral Daily Princess Bruins, DO   500 mg at 09/06/22 0981   Current Outpatient Medications  Medication Sig Dispense Refill   ABILIFY MAINTENA 400 MG PRSY prefilled syringe 400 mg every 28 (twenty-eight) days.     cetirizine (ZYRTEC) 10 MG tablet Take 10 mg by mouth daily.     cyclobenzaprine (FLEXERIL) 10 MG tablet Take 1 tablet (10 mg total) by mouth 2 (two) times daily as needed for muscle spasms. 20 tablet 0   fluticasone (FLONASE) 50 MCG/ACT nasal spray Place 1 spray into both nostrils daily.     meloxicam (MOBIC) 15 MG tablet Take 15 mg by mouth daily.     Oxcarbazepine (TRILEPTAL) 300 MG tablet Take 1 tablet (300 mg total) by mouth 2 (two) times daily. 60 tablet 0   QUEtiapine (SEROQUEL) 400 MG tablet Take 1 tablet (400 mg total) by mouth 2 (two) times daily. 60 tablet 0   sertraline (ZOLOFT) 50 MG  tablet Take 3 tablets (150 mg total) by mouth in the morning. 90 tablet 0   traZODone (DESYREL) 100 MG tablet Take 1 tablet (100 mg total) by mouth at bedtime. 30 tablet 0   valACYclovir (VALTREX) 500 MG tablet Take 500 mg by mouth daily.      Labs  Lab Results:  Admission on 07/25/2022  Component Date Value Ref Range Status   SARS Coronavirus 2 by RT PCR 07/25/2022 NEGATIVE  NEGATIVE Final   Comment: (NOTE) SARS-CoV-2 target nucleic acids are NOT DETECTED.  The SARS-CoV-2 RNA is generally detectable in upper respiratory specimens during the acute phase of infection. The lowest concentration of SARS-CoV-2 viral copies this assay can detect is 138 copies/mL. A negative result does not preclude SARS-Cov-2 infection and should not be used as the sole basis for treatment or other patient management decisions. A negative result may occur with  improper specimen collection/handling, submission of specimen other than nasopharyngeal swab, presence of viral mutation(s) within the areas targeted by this assay, and inadequate number of viral copies(<138 copies/mL). A negative result must be combined with clinical observations, patient history, and epidemiological information. The expected result is Negative.  Fact Sheet for Patients:  BloggerCourse.com  Fact Sheet for Healthcare Providers:  SeriousBroker.it  This test is no                          t yet approved or cleared by the [place] FDA and  has been authorized for detection and/or diagnosis of SARS-CoV-2 by FDA under an Emergency Use Authorization (EUA). This EUA will remain  in effect (meaning this test can be used) for the duration of the COVID-19 declaration under Section 564(b)(1) of the Act, 21 U.S.C.section 360bbb-3(b)(1), unless the authorization is terminated  or revoked sooner.       Influenza A by PCR 07/25/2022 NEGATIVE  NEGATIVE Final   Influenza B by  PCR  07/25/2022 NEGATIVE  NEGATIVE Final   Comment: (NOTE) The Xpert Xpress SARS-CoV-2/FLU/RSV plus assay is intended as an aid in the diagnosis of influenza from Nasopharyngeal swab specimens and should not be used as a sole basis for treatment. Nasal washings and aspirates are unacceptable for Xpert Xpress SARS-CoV-2/FLU/RSV testing.  Fact Sheet for Patients: BloggerCourse.com  Fact Sheet for Healthcare Providers: SeriousBroker.it  This test is not yet approved or cleared by the [place] FDA and has been authorized for detection and/or diagnosis of SARS-CoV-2 by FDA under an Emergency Use Authorization (EUA). This EUA will remain in effect (meaning this test can be used) for the duration of the COVID-19 declaration under Section 564(b)(1) of the Act, 21 U.S.C. section 360bbb-3(b)(1), unless the authorization is terminated or revoked.  Performed at [facility], [address] [place], [place] [address]    WBC 07/25/2022 8.3  4.0 - 10.5 K/uL Final   RBC 07/25/2022 4.44  3.87 - 5.11 MIL/uL Final   Hemoglobin 07/25/2022 13.7  12.0 - 15.0 g/dL Final   HCT 60/45/4098 40.2  36.0 - 46.0 % Final   MCV 07/25/2022 90.5  80.0 - 100.0 fL Final   MCH 07/25/2022 30.9  26.0 - 34.0 pg Final   MCHC 07/25/2022 34.1  30.0 - 36.0 g/dL Final   RDW 11/91/4782 12.2  11.5 - 15.5 % Final   Platelets 07/25/2022 248  150 - 400 K/uL Final   nRBC 07/25/2022 0.0  0.0 - 0.2 % Final   Neutrophils Relative % 07/25/2022 43  % Final   Neutro Abs 07/25/2022 3.6  1.7 - 7.7 K/uL Final   Lymphocytes Relative 07/25/2022 52  % Final   Lymphs Abs 07/25/2022 4.2 (H)  0.7 - 4.0 K/uL Final   Monocytes Relative 07/25/2022 5  % Final   Monocytes Absolute 07/25/2022 0.4  0.1 - 1.0 K/uL Final   Eosinophils Relative 07/25/2022 0  % Final   Eosinophils Absolute 07/25/2022 0.0  0.0 - 0.5 K/uL Final   Basophils Relative 07/25/2022 0  % Final   Basophils Absolute  07/25/2022 0.0  0.0 - 0.1 K/uL Final   Immature Granulocytes 07/25/2022 0  % Final   Abs Immature Granulocytes 07/25/2022 0.02  0.00 - 0.07 K/uL Final   Performed at [facility], [address] [place], [place] [address]   Sodium 07/25/2022 138  135 - 145 mmol/L Final   Potassium 07/25/2022 4.0  3.5 - 5.1 mmol/L Final   Chloride 07/25/2022 104  98 - 111 mmol/L Final   CO2 07/25/2022 29  22 - 32 mmol/L Final   Glucose, Bld 07/25/2022 83  70 - 99 mg/dL Final   Glucose reference range applies only to samples taken after fasting for at least 8 hours.   BUN 07/25/2022 11  6 - 20 mg/dL Final   Creatinine, Ser 07/25/2022 0.97  0.44 - 1.00 mg/dL Final   Calcium 30/86/5784 9.2  8.9 - 10.3 mg/dL Final   Total Protein 69/62/9528 7.0  6.5 - 8.1 g/dL Final   Albumin 41/32/4401 3.8  3.5 - 5.0 g/dL Final   AST 02/72/5366 18  15 - 41 U/L Final   ALT 07/25/2022 22  0 - 44 U/L Final   Alkaline Phosphatase 07/25/2022 64  38 - 126 U/L Final   Total Bilirubin 07/25/2022 0.2 (L)  0.3 - 1.2 mg/dL Final   GFR, Estimated 07/25/2022 >60  >60 mL/min Final   Comment: (NOTE) Calculated using the CKD-EPI Creatinine  Equation (2021)    Anion gap 07/25/2022 5  5 - 15 Final   Performed at [facility], [address] [place], [place] [address]   POC Amphetamine UR 07/25/2022 None Detected  NONE DETECTED (Cut Off Level 1000 ng/mL) Preliminary   POC Secobarbital (BAR) 07/25/2022 None Detected  NONE DETECTED (Cut Off Level 300 ng/mL) Preliminary   POC Buprenorphine (BUP) 07/25/2022 None Detected  NONE DETECTED (Cut Off Level 10 ng/mL) Preliminary   POC Oxazepam (BZO) 07/25/2022 None Detected  NONE DETECTED (Cut Off Level 300 ng/mL) Preliminary   POC Cocaine UR 07/25/2022 None Detected  NONE DETECTED (Cut Off Level 300 ng/mL) Preliminary   POC Methamphetamine UR 07/25/2022 None Detected  NONE DETECTED (Cut Off Level 1000 ng/mL) Preliminary   POC Morphine 07/25/2022 None Detected  NONE DETECTED (Cut Off Level  300 ng/mL) Preliminary   POC Methadone UR 07/25/2022 None Detected  NONE DETECTED (Cut Off Level 300 ng/mL) Preliminary   POC Oxycodone UR 07/25/2022 None Detected  NONE DETECTED (Cut Off Level 100 ng/mL) Preliminary   POC Marijuana UR 07/25/2022 None Detected  NONE DETECTED (Cut Off Level 50 ng/mL) Preliminary   SARSCOV2ONAVIRUS 2 AG 07/25/2022 NEGATIVE  NEGATIVE Final   Comment: (NOTE) SARS-CoV-2 antigen NOT DETECTED.   Negative results are presumptive.  Negative results do not preclude SARS-CoV-2 infection and should not be used as the sole basis for treatment or other patient management decisions, including infection  control decisions, particularly in the presence of clinical signs and  symptoms consistent with COVID-19, or in those who have been in contact with the virus.  Negative results must be combined with clinical observations, patient history, and epidemiological information. The expected result is Negative.  Fact Sheet for Patients: https://www.jennings-kim.com/  Fact Sheet for Healthcare Providers: https://alexander-rogers.biz/  This test is not yet approved or cleared by the [place] FDA and  has been authorized for detection and/or diagnosis of SARS-CoV-2 by FDA under an Emergency Use Authorization (EUA).  This EUA will remain in effect (meaning this test can be used) for the duration of  the COV                          ID-19 declaration under Section 564(b)(1) of the Act, 21 U.S.C. section 360bbb-3(b)(1), unless the authorization is terminated or revoked sooner.     Cholesterol 07/25/2022 181  0 - 200 mg/dL Final   Triglycerides 10/16/7251 118  <150 mg/dL Final   HDL 66/44/0347 45  >40 mg/dL Final   Total CHOL/HDL Ratio 07/25/2022 4.0  RATIO Final   VLDL 07/25/2022 24  0 - 40 mg/dL Final   LDL Cholesterol 07/25/2022 112 (H)  0 - 99 mg/dL Final   Comment:        Total Cholesterol/HDL:CHD Risk Coronary Heart Disease Risk Table                      Men   Women  1/2 Average Risk   3.4   3.3  Average Risk       5.0   4.4  2 X Average Risk   9.6   7.1  3 X Average Risk  23.4   11.0        Use the calculated Patient Ratio above and the CHD Risk Table to determine the patient's CHD Risk.        ATP III CLASSIFICATION (LDL):  <100     mg/dL  Optimal  100-129  mg/dL   Near or Above                    Optimal  130-159  mg/dL   Borderline  161-096  mg/dL   High  >045     mg/dL   Very High Performed at [facility], [address], [place], [place] [address]    TSH 07/25/2022 6.668 (H)  0.350 - 4.500 uIU/mL Final   Comment: Performed by a 3rd Generation assay with a functional sensitivity of <=0.01 uIU/mL. Performed at [facility], [address] [place], [place] [address]    Glucose-Capillary 07/26/2022 104 (H)  70 - 99 mg/dL Final   Glucose reference range applies only to samples taken after fasting for at least 8 hours.   T3, Free 07/31/2022 2.3  2.0 - 4.4 pg/mL Final   Comment: (NOTE) Performed At: [facility] [address] [place], [place] [id number] Jolene Schimke MD QM:[id number]    Free T4 07/31/2022 0.60 (L)  0.61 - 1.12 ng/dL Final   Comment: (NOTE) Biotin ingestion may interfere with free T4 tests. If the results are inconsistent with the TSH level, previous test results, or the clinical presentation, then consider biotin interference. If needed, order repeat testing after stopping biotin. Performed at [facility], [address] [place], [place] [address]    Glucose-Capillary 08/29/2022 100 (H)  70 - 99 mg/dL Final   Glucose reference range applies only to samples taken after fasting for at least 8 hours.   TSH 09/05/2022 0.793  0.350 - 4.500 uIU/mL Final   Comment: Performed by a 3rd Generation assay with a functional sensitivity of <=0.01 uIU/mL. Performed at [facility], [address] [place], [place] [address]   Admission on 07/04/2022, Discharged on  07/05/2022  Component Date Value Ref Range Status   Sodium 07/04/2022 142  135 - 145 mmol/L Final   Potassium 07/04/2022 4.4  3.5 - 5.1 mmol/L Final   Chloride 07/04/2022 109  98 - 111 mmol/L Final   CO2 07/04/2022 26  22 - 32 mmol/L Final   Glucose, Bld 07/04/2022 96  70 - 99 mg/dL Final   Glucose reference range applies only to samples taken after fasting for at least 8 hours.   BUN 07/04/2022 15  6 - 20 mg/dL Final   Creatinine, Ser 07/04/2022 0.83  0.44 - 1.00 mg/dL Final   Calcium 02/72/5366 9.3  8.9 - 10.3 mg/dL Final   Total Protein 44/12/4740 7.2  6.5 - 8.1 g/dL Final   Albumin 59/56/3875 3.9  3.5 - 5.0 g/dL Final   AST 64/33/2951 23  15 - 41 U/L Final   ALT 07/04/2022 28  0 - 44 U/L Final   Alkaline Phosphatase 07/04/2022 77  38 - 126 U/L Final   Total Bilirubin 07/04/2022 0.4  0.3 - 1.2 mg/dL Final   GFR, Estimated 07/04/2022 >60  >60 mL/min Final   Comment: (NOTE) Calculated using the CKD-EPI Creatinine Equation (2021)    Anion gap 07/04/2022 7  5 - 15 Final   Performed at [facility], [address] [place], [place] [address]   Alcohol, Ethyl (B) 07/04/2022 <10  <10 mg/dL Final   Comment: (NOTE) Lowest detectable limit for serum alcohol is 10 mg/dL.  For medical purposes only. Performed at [facility], [address], [place], [place] [address]    WBC 07/04/2022 7.0  4.0 - 10.5  K/uL Final   RBC 07/04/2022 4.18  3.87 - 5.11 MIL/uL Final   Hemoglobin 07/04/2022 12.8  12.0 - 15.0 g/dL Final   HCT 16/07/9603 39.1  36.0 - 46.0 % Final   MCV 07/04/2022 93.5  80.0 - 100.0 fL Final   MCH 07/04/2022 30.6  26.0 - 34.0 pg Final   MCHC 07/04/2022 32.7  30.0 - 36.0 g/dL Final   RDW 54/06/8118 12.9  11.5 - 15.5 % Final   Platelets 07/04/2022 243  150 - 400 K/uL Final   nRBC 07/04/2022 0.0  0.0 - 0.2 % Final   Neutrophils Relative % 07/04/2022 43  % Final   Neutro Abs 07/04/2022 3.0  1.7 - 7.7 K/uL Final   Lymphocytes Relative  07/04/2022 50  % Final   Lymphs Abs 07/04/2022 3.5  0.7 - 4.0 K/uL Final   Monocytes Relative 07/04/2022 7  % Final   Monocytes Absolute 07/04/2022 0.5  0.1 - 1.0 K/uL Final   Eosinophils Relative 07/04/2022 0  % Final   Eosinophils Absolute 07/04/2022 0.0  0.0 - 0.5 K/uL Final   Basophils Relative 07/04/2022 0  % Final   Basophils Absolute 07/04/2022 0.0  0.0 - 0.1 K/uL Final   Immature Granulocytes 07/04/2022 0  % Final   Abs Immature Granulocytes 07/04/2022 0.01  0.00 - 0.07 K/uL Final   Performed at [facility], [address] [place], [place] [address]   I-stat hCG, quantitative 07/04/2022 <5.0  <5 mIU/mL Final   Comment 3 07/04/2022          Final   Comment:   GEST. AGE      CONC.  (mIU/mL)   <=1 WEEK        5 - 50     2 WEEKS       50 - 500     3 WEEKS       100 - 10,000     4 WEEKS     1,000 - 30,000        FEMALE AND NON-PREGNANT FEMALE:     LESS THAN 5 mIU/mL   Admission on 06/14/2022, Discharged on 06/14/2022  Component Date Value Ref Range Status   Color, UA 06/14/2022 yellow  yellow Final   Clarity, UA 06/14/2022 cloudy (A)  clear Final   Glucose, UA 06/14/2022 negative  negative mg/dL Final   Bilirubin, UA 95/62/1308 negative  negative Final   Ketones, POC UA 06/14/2022 negative  negative mg/dL Final   Spec Grav, UA 65/78/4696 1.025  1.010 - 1.025 Final   Blood, UA 06/14/2022 negative  negative Final   pH, UA 06/14/2022 7.5  5.0 - 8.0 Final   Protein Ur, POC 06/14/2022 =30 (A)  negative mg/dL Final   Urobilinogen, UA 06/14/2022 0.2  0.2 or 1.0 E.U./dL Final   Nitrite, UA 29/52/8413 Negative  Negative Final   Leukocytes, UA 06/14/2022 Negative  Negative Final   Preg Test, Ur 06/14/2022 Negative  Negative Final   Specimen Description 06/14/2022 URINE, CLEAN CATCH   Final   Special Requests 06/14/2022    Final                   Value:NONE Performed at [facility], [address] [place], [place] [address]    Culture 06/14/2022 MULTIPLE  SPECIES PRESENT, SUGGEST RECOLLECTION (A)   Final   Report Status 06/14/2022 06/16/2022 FINAL   Final  Admission on 06/07/2022, Discharged on 06/10/2022  Component Date Value Ref Range  Status   SARS Coronavirus 2 by RT PCR 06/07/2022 NEGATIVE  NEGATIVE Final   Comment: (NOTE) SARS-CoV-2 target nucleic acids are NOT DETECTED.  The SARS-CoV-2 RNA is generally detectable in upper respiratory specimens during the acute phase of infection. The lowest concentration of SARS-CoV-2 viral copies this assay can detect is 138 copies/mL. A negative result does not preclude SARS-Cov-2 infection and should not be used as the sole basis for treatment or other patient management decisions. A negative result may occur with  improper specimen collection/handling, submission of specimen other than nasopharyngeal swab, presence of viral mutation(s) within the areas targeted by this assay, and inadequate number of viral copies(<138 copies/mL). A negative result must be combined with clinical observations, patient history, and epidemiological information. The expected result is Negative.  Fact Sheet for Patients:  BloggerCourse.com  Fact Sheet for Healthcare Providers:  SeriousBroker.it  This test is no                          t yet approved or cleared by the [place] FDA and  has been authorized for detection and/or diagnosis of SARS-CoV-2 by FDA under an Emergency Use Authorization (EUA). This EUA will remain  in effect (meaning this test can be used) for the duration of the COVID-19 declaration under Section 564(b)(1) of the Act, 21 U.S.C.section 360bbb-3(b)(1), unless the authorization is terminated  or revoked sooner.       Influenza A by PCR 06/07/2022 NEGATIVE  NEGATIVE Final   Influenza B by PCR 06/07/2022 NEGATIVE  NEGATIVE Final   Comment: (NOTE) The Xpert Xpress SARS-CoV-2/FLU/RSV plus assay is intended as an aid in the diagnosis of  influenza from Nasopharyngeal swab specimens and should not be used as a sole basis for treatment. Nasal washings and aspirates are unacceptable for Xpert Xpress SARS-CoV-2/FLU/RSV testing.  Fact Sheet for Patients: BloggerCourse.com  Fact Sheet for Healthcare Providers: SeriousBroker.it  This test is not yet approved or cleared by the [place] FDA and has been authorized for detection and/or diagnosis of SARS-CoV-2 by FDA under an Emergency Use Authorization (EUA). This EUA will remain in effect (meaning this test can be used) for the duration of the COVID-19 declaration under Section 564(b)(1) of the Act, 21 U.S.C. section 360bbb-3(b)(1), unless the authorization is terminated or revoked.  Performed at [facility], [address], [place], [place] [address]    WBC 06/07/2022 5.7  4.0 - 10.5 K/uL Final   RBC 06/07/2022 4.39  3.87 - 5.11 MIL/uL Final   Hemoglobin 06/07/2022 13.2  12.0 - 15.0 g/dL Final   HCT 33/29/5188 40.4  36.0 - 46.0 % Final   MCV 06/07/2022 92.0  80.0 - 100.0 fL Final   MCH 06/07/2022 30.1  26.0 - 34.0 pg Final   MCHC 06/07/2022 32.7  30.0 - 36.0 g/dL Final   RDW 41/66/0630 12.4  11.5 - 15.5 % Final   Platelets 06/07/2022 308  150 - 400 K/uL Final   nRBC 06/07/2022 0.0  0.0 - 0.2 % Final   Neutrophils Relative % 06/07/2022 42  % Final   Neutro Abs 06/07/2022 2.4  1.7 - 7.7 K/uL Final   Lymphocytes Relative 06/07/2022 54  % Final   Lymphs Abs 06/07/2022 3.1  0.7 - 4.0 K/uL Final   Monocytes Relative 06/07/2022 4  % Final   Monocytes Absolute 06/07/2022 0.2  0.1 - 1.0 K/uL Final   Eosinophils Relative 06/07/2022 0  % Final  Eosinophils Absolute 06/07/2022 0.0  0.0 - 0.5 K/uL Final   Basophils Relative 06/07/2022 0  % Final   Basophils Absolute 06/07/2022 0.0  0.0 - 0.1 K/uL Final   Immature Granulocytes 06/07/2022 0  % Final   Abs Immature Granulocytes 06/07/2022 0.01  0.00 - 0.07 K/uL Final    Performed at [facility], [address], [place], [place] [address]   Sodium 06/07/2022 139  135 - 145 mmol/L Final   Potassium 06/07/2022 4.0  3.5 - 5.1 mmol/L Final   Chloride 06/07/2022 104  98 - 111 mmol/L Final   CO2 06/07/2022 28  22 - 32 mmol/L Final   Glucose, Bld 06/07/2022 104 (H)  70 - 99 mg/dL Final   Glucose reference range applies only to samples taken after fasting for at least 8 hours.   BUN 06/07/2022 8  6 - 20 mg/dL Final   Creatinine, Ser 06/07/2022 0.84  0.44 - 1.00 mg/dL Final   Calcium 60/45/4098 9.1  8.9 - 10.3 mg/dL Final   Total Protein 11/91/4782 6.9  6.5 - 8.1 g/dL Final   Albumin 95/62/1308 3.7  3.5 - 5.0 g/dL Final   AST 65/78/4696 19  15 - 41 U/L Final   ALT 06/07/2022 22  0 - 44 U/L Final   Alkaline Phosphatase 06/07/2022 54  38 - 126 U/L Final   Total Bilirubin 06/07/2022 0.4  0.3 - 1.2 mg/dL Final   GFR, Estimated 06/07/2022 >60  >60 mL/min Final   Comment: (NOTE) Calculated using the CKD-EPI Creatinine Equation (2021)    Anion gap 06/07/2022 7  5 - 15 Final   Performed at [facility], [address] [place], [place] [address]   Hgb A1c MFr Bld 06/07/2022 5.0  4.8 - 5.6 % Final   Comment: (NOTE) Pre diabetes:          5.7%-6.4%  Diabetes:              >6.4%  Glycemic control for   <7.0% adults with diabetes    Mean Plasma Glucose 06/07/2022 96.8  mg/dL Final   Performed at [facility], [address], [place], [place] [address]   TSH 06/07/2022 1.620  0.350 - 4.500 uIU/mL Final   Comment: Performed by a 3rd Generation assay with a functional sensitivity of <=0.01 uIU/mL. Performed at [facility], [address], [place], [place] [address]    RPR Ser Ql 06/07/2022 NON REACTIVE  NON REACTIVE Final   Performed at [facility], [address] [place], [place] [address]   Color, Urine 06/07/2022 YELLOW  YELLOW Final   APPearance 06/07/2022 HAZY (A)  CLEAR Final   Specific Gravity, Urine 06/07/2022 1.018  1.005 -  1.030 Final   pH 06/07/2022 7.0  5.0 - 8.0 Final   Glucose, UA 06/07/2022 NEGATIVE  NEGATIVE mg/dL Final   Hgb urine dipstick 06/07/2022 NEGATIVE  NEGATIVE Final   Bilirubin Urine 06/07/2022 NEGATIVE  NEGATIVE Final   Ketones, ur 06/07/2022 NEGATIVE  NEGATIVE mg/dL Final   Protein, ur 64/40/3474 NEGATIVE  NEGATIVE mg/dL Final   Nitrite 25/95/6387 NEGATIVE  NEGATIVE Final   Leukocytes,Ua 06/07/2022 NEGATIVE  NEGATIVE Final   Performed at [facility], [address] [place], [place] [address]   Cholesterol 06/07/2022 164  0 - 200 mg/dL Final   Triglycerides 29/51/8841 158 (H)  <150 mg/dL Final   HDL 66/03/3015 44  >40 mg/dL Final   Total CHOL/HDL Ratio  06/07/2022 3.7  RATIO Final   VLDL 06/07/2022 32  0 - 40 mg/dL Final   LDL Cholesterol 06/07/2022 88  0 - 99 mg/dL Final   Comment:        Total Cholesterol/HDL:CHD Risk Coronary Heart Disease Risk Table                     Men   Women  1/2 Average Risk   3.4   3.3  Average Risk       5.0   4.4  2 X Average Risk   9.6   7.1  3 X Average Risk  23.4   11.0        Use the calculated Patient Ratio above and the CHD Risk Table to determine the patient's CHD Risk.        ATP III CLASSIFICATION (LDL):  <100     mg/dL   Optimal  161-096  mg/dL   Near or Above                    Optimal  130-159  mg/dL   Borderline  045-409  mg/dL   High  >811     mg/dL   Very High Performed at [facility], [address], [place], [place] [address]    HIV Screen 4th Generation wRfx 06/07/2022 Non Reactive  Non Reactive Final   Performed at [facility], [address] [place], [place] [address]   SARSCOV2ONAVIRUS 2 AG 06/07/2022 NEGATIVE  NEGATIVE Final   Comment: (NOTE) SARS-CoV-2 antigen NOT DETECTED.   Negative results are presumptive.  Negative results do not preclude SARS-CoV-2 infection and should not be used as the sole basis for treatment or other patient management decisions, including infection  control decisions,  particularly in the presence of clinical signs and  symptoms consistent with COVID-19, or in those who have been in contact with the virus.  Negative results must be combined with clinical observations, patient history, and epidemiological information. The expected result is Negative.  Fact Sheet for Patients: https://www.jennings-kim.com/  Fact Sheet for Healthcare Providers: https://alexander-rogers.biz/  This test is not yet approved or cleared by the [place] FDA and  has been authorized for detection and/or diagnosis of SARS-CoV-2 by FDA under an Emergency Use Authorization (EUA).  This EUA will remain in effect (meaning this test can be used) for the duration of  the COV                          ID-19 declaration under Section 564(b)(1) of the Act, 21 U.S.C. section 360bbb-3(b)(1), unless the authorization is terminated or revoked sooner.     POC Amphetamine UR 06/07/2022 None Detected  NONE DETECTED (Cut Off Level 1000 ng/mL) Final   POC Secobarbital (BAR) 06/07/2022 None Detected  NONE DETECTED (Cut Off Level 300 ng/mL) Final   POC Buprenorphine (BUP) 06/07/2022 None Detected  NONE DETECTED (Cut Off Level 10 ng/mL) Final   POC Oxazepam (BZO) 06/07/2022 None Detected  NONE DETECTED (Cut Off Level 300 ng/mL) Final   POC Cocaine UR 06/07/2022 None Detected  NONE DETECTED (Cut Off Level 300 ng/mL) Final   POC Methamphetamine UR 06/07/2022 None Detected  NONE DETECTED (Cut Off Level 1000 ng/mL) Final   POC Morphine 06/07/2022 None Detected  NONE DETECTED (Cut Off Level 300 ng/mL) Final   POC Methadone UR 06/07/2022 None Detected  NONE DETECTED (Cut Off Level 300 ng/mL)  Final   POC Oxycodone UR 06/07/2022 None Detected  NONE DETECTED (Cut Off Level 100 ng/mL) Final   POC Marijuana UR 06/07/2022 None Detected  NONE DETECTED (Cut Off Level 50 ng/mL) Final   Preg Test, Ur 06/08/2022 NEGATIVE  NEGATIVE Final   Comment:        THE SENSITIVITY OF  THIS METHODOLOGY IS >20 mIU/mL. Performed at [facility], [address] [place], [place] [id number]    Preg Test, Ur 06/08/2022 NEGATIVE  NEGATIVE Final   Comment:        THE SENSITIVITY OF THIS METHODOLOGY IS >24 mIU/mL     Blood Alcohol level:  Lab Results  Component Value Date   ETH <10 07/04/2022   ETH <10 02/07/2022    Metabolic Disorder Labs: Lab Results  Component Value Date   HGBA1C 5.0 06/07/2022   MPG 96.8 06/07/2022   MPG 96.8 02/07/2022   No results found for: "PROLACTIN" Lab Results  Component Value Date   CHOL 181 07/25/2022   TRIG 118 07/25/2022   HDL 45 07/25/2022   CHOLHDL 4.0 07/25/2022   VLDL 24 07/25/2022   LDLCALC 112 (H) 07/25/2022   LDLCALC 88 06/07/2022    Therapeutic Lab Levels: No results found for: "LITHIUM" No results found for: "VALPROATE" No results found for: "CBMZ"  Physical Findings   GAD-7    Flowsheet Row [facility]  Total GAD-7 Score 17      PHQ2-9    Flowsheet Row [facility]  PHQ-2 Total Score 1 2  PHQ-9 Total Score 2 8      Flowsheet Row [facility] from [facility]20[facility]   C-SSRS RISK CATEGORY High Risk No Risk No Risk        Musculoskeletal  Strength & Muscle Tone: within normal limits Gait & Station: normal Patient leans: N/A  Psychiatric Specialty Exam  Presentation  General Appearance:  Appropriate for Environment; Casual  Eye Contact: Good  Speech: Clear and Coherent; Normal Rate  Speech Volume: Normal  Handedness: Right   Mood and Affect  Mood: Euthymic  Affect: Appropriate; Congruent   Thought Process  Thought Processes: Coherent; Goal  Directed  Descriptions of Associations:Intact  Orientation:Full (Time, Place and Person)  Thought Content:Logical  Diagnosis of Schizophrenia or Schizoaffective disorder in past: No    Hallucinations:Hallucinations: None    Ideas of Reference:None  Suicidal Thoughts:Suicidal Thoughts: No    Homicidal Thoughts:Homicidal Thoughts: No     Sensorium  Memory: Immediate Fair; Recent Fair  Judgment: Intact  Insight: Fair; Present   Executive Functions  Concentration: Good  Attention Span: Good  Recall: FiservFair  Fund of Knowledge: Fair  Language: Fair   Psychomotor Activity  Psychomotor Activity: Psychomotor Activity: Normal     Assets  Assets: Communication Skills; Desire for Improvement   Sleep  Sleep: Sleep: Good      Physical Exam  Physical Exam Vitals and nursing note reviewed.  Constitutional:      Appearance: Normal appearance. She is well-developed.  Cardiovascular:     Rate and Rhythm: Normal rate.  Pulmonary:     Effort: Pulmonary effort is normal.  Musculoskeletal:        General: Normal range of  motion.     Cervical back: Normal range of motion.  Skin:    General: Skin is warm and dry.  Neurological:     Mental Status: She is alert and oriented to person, place, and time.  Psychiatric:        Attention and Perception: Attention and perception normal.        Mood and Affect: Mood and affect normal.        Speech: Speech normal.        Behavior: Behavior normal.        Thought Content: Thought content normal.        Cognition and Memory: Cognition normal.    Review of Systems  Constitutional: Negative.   HENT: Negative.    Eyes: Negative.   Respiratory: Negative.    Cardiovascular: Negative.   Gastrointestinal:  Positive for constipation. Negative for nausea and vomiting. Abdominal pain: Denies. Genitourinary: Negative.   Musculoskeletal:  Positive for back pain.  Skin: Negative.   Neurological: Negative.    Psychiatric/Behavioral:  Depression: Stable. Hallucinations: Denies. Suicidal ideas: Denies. Nervous/anxious: Stable.    Blood pressure 117/83, pulse (!) 109, temperature 97.6 F (36.4 C), temperature source Oral, resp. rate 20, SpO2 98 %. There is no height or weight on file to calculate BMI.  Treatment Plan Summary: Daily contact with patient to assess and evaluate symptoms and progress in treatment Patient remains cleared by psychiatry.  Disposition is pending placement, transition of care staff pursuing disposition plan.  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  [name], MD 09/06/2022 4:07 PM

## 2022-09-06 NOTE — ED Notes (Signed)
Pt siting in chair watching a movie on televisions. Pt calm and cooperative. No c/ pian or distress. Will continue to monitor for safety

## 2022-09-06 NOTE — ED Notes (Signed)
Instructed patient that we need another urine sample - states she will do it "when she has to pee again"

## 2022-09-06 NOTE — ED Notes (Signed)
Patient denies SI, HI and AVH - no complaints at this time - will continue to monitor for safety

## 2022-09-06 NOTE — ED Notes (Signed)
Patient in no sxs of distress - will continue to monitor for safety

## 2022-09-06 NOTE — Progress Notes (Signed)
[name] Progress Note   [facility] - [phone] - zero availability.  [facility] - [phone] - Left a voicemail requesting a call back.  [facility] - [phone] - Number not in service. Researched on [facility] website for alternative number - [phone].  Left a voicemail requesting a call back.  [facility] [phone] - left a voicemail requesting a call back.  [facility] [phone] - number not in service.    Hansel Starling, MSW, [name] [facility] [phone] phone [phone] fax

## 2022-09-07 DIAGNOSIS — F333 Major depressive disorder, recurrent, severe with psychotic symptoms: Secondary | ICD-10-CM | POA: Diagnosis not present

## 2022-09-07 NOTE — ED Notes (Signed)
Pt sleeping at present, no distress noted.  Monitoring for safety. 

## 2022-09-07 NOTE — ED Notes (Signed)
Pt voiced complaints of constipation, stating she has not moved her Bowels in 2-3 days and the little she passed tonight is very hard.  NP Shalon notified.

## 2022-09-07 NOTE — ED Notes (Signed)
No pain or discomfort noted/ reported. Pt alert, oriented, and ambulatory.  Breathing is even and  unlabored.  Will continue to monitor for safety. 

## 2022-09-07 NOTE — ED Provider Notes (Signed)
Behavioral Health Progress Note  Date and Time: 09/07/2022 11:22 AM Name: Nicole [name] MRN:  [id number]   Per H&P: Nicole [name] is a 31 y.o. female, with PMH of SCZ unspecified, MDD with psychosis, who presented voluntary to [facility] (07/25/2022) via [facility] after a verbal altercation with [name] (not patient's legal guardian) for transient SI. This is patient's fourth visit to [facility] for similar concerns this year. Patient has been dismissed from previous group home due to threatening SI and HI, then leaving the group home.   Legal Guardian: Nicole [name]) transitioning to be Nicole [name]) Point of contact: Nicole [name])   Subjective: Patient seen and evaluated face-to-face by this provider, and chart reviewed. On evaluation, patient is alert and oriented x 4.  Her thought process is logical and relevant. Her speech is clear and coherent at a moderate tone. Her mood is euthymic and affect is congruent. She denies SI/HI/AVH. There is no objective evidence that the patient is currently responding to internal or external stimuli.  She denies depressive symptoms. She reports feeling a little anxious this morning and rates her anxiety 6 out of 10 with 10 being the worst. She is unable to identify triggers to her anxiety. She reports good sleep. She reports a good appetite. She is compliant with taking scheduled medications and denies medication side effects. She denies physical complaints at this time. She asked if her thyroid level is better. I advised the patient that I will check the results and update her at a later time this morning. She denies additional concerns at this time  Diagnosis:  Final diagnoses:  Severe episode of recurrent major depressive disorder, with psychotic features ([place])    Total Time spent with patient: 15 minutes  Past Psychiatric History: history of schizophrenia, unspecified type, MDD and psychosis.   Past Medical History:  Past Medical History:   Diagnosis Date   Anxiety    Depression    Hypothyroidism 08/07/2022   Tobacco use disorder 08/07/2022    Past Surgical History:  Procedure Laterality Date   WISDOM TOOTH EXTRACTION Bilateral 2020   Family History:  Family History  Problem Relation Age of Onset   Hypertension Father    Diabetes Father    Family Psychiatric  History: No history reported.  Social History:  Social History   Substance and Sexual Activity  Alcohol Use Never     Social History   Substance and Sexual Activity  Drug Use Never    Social History   Socioeconomic History   Marital status: Single    Spouse name: Not on file   Number of children: Not on file   Years of education: Not on file   Highest education level: Not on file  Occupational History   Not on file  Tobacco Use   Smoking status: Every Day    Types: Cigarettes   Smokeless tobacco: Never  Vaping Use   Vaping Use: Some days  Substance and Sexual Activity   Alcohol use: Never   Drug use: Never   Sexual activity: Yes    Partners: Female    Birth control/protection: Implant  Other Topics Concern   Not on file  Social History Narrative   Not on file   Social Determinants of Health   Financial Resource Strain: Not on file  Food Insecurity: Not on file  Transportation Needs: Not on file  Physical Activity: Not on file  Stress: Not on file  Social Connections: Not on file  SDOH:  SDOH Screenings   Depression (PHQ2-9): Low Risk  (06/10/2022)  Tobacco Use: High Risk (09/01/2022)   Additional Social History:    Pain Medications: SEE MAR Prescriptions: SEE MAR Over the Counter: SEE MAR History of alcohol / drug use?: Yes Longest period of sobriety (when/how long): N/A Negative Consequences of Use:  (NONE) Withdrawal Symptoms: None Name of Substance 1: ETOH IN PAST--PT CURRENTLY DENIES 1 - Frequency: SOCIAL USE ETOH IN PAST 1 - Method of Aquiring: LEGAL 1- Route of Use: ORAL DRINK Name of Substance 2:  [place] 2 - Amount (size/oz): LESS THAN ONE PACK 2 - Frequency: DAILY 2 - Method of Aquiring: LEGAL 2 - Route of Substance Use: ORAL SMOKE      Current Medications:  Current Facility-Administered Medications  Medication Dose Route Frequency Provider Last Rate Last Admin   acetaminophen (TYLENOL) tablet 650 mg  650 mg Oral Q6H PRN Onuoha, Chinwendu V, NP   650 mg at 09/05/22 2219   alum & mag hydroxide-simeth (MAALOX/MYLANTA) 200-200-20 MG/5ML suspension 30 mL  30 mL Oral Q4H PRN Onuoha, Chinwendu V, NP   30 mL at 07/27/22 2151   ARIPiprazole ER (ABILIFY MAINTENA) injection 400 mg  400 mg Intramuscular Q28 days Merrily Brittle, DO   400 mg at 08/30/22 1338   clotrimazole (LOTRIMIN) 1 % cream   Topical BID Bobbitt, Shalon E, NP   Given at 09/07/22 0907   docusate sodium (COLACE) capsule 100 mg  100 mg Oral Daily Rosezetta Schlatter, MD   100 mg at 09/07/22 0908   hydrOXYzine (ATARAX) tablet 25 mg  25 mg Oral TID PRN Onuoha, Chinwendu V, NP   25 mg at 09/06/22 2103   ibuprofen (ADVIL) tablet 400 mg  400 mg Oral Q6H PRN Onuoha, Chinwendu V, NP   400 mg at 09/06/22 1314   levothyroxine (SYNTHROID) tablet 100 mcg  100 mcg Oral Q0600 Merrily Brittle, DO   100 mcg at 09/07/22 0615   ziprasidone (GEODON) injection 20 mg  20 mg Intramuscular Q12H PRN Onuoha, Chinwendu V, NP       And   LORazepam (ATIVAN) tablet 1 mg  1 mg Oral PRN Onuoha, Chinwendu V, NP       nicotine (NICODERM CQ - dosed in mg/24 hours) patch 14 mg  14 mg Transdermal Q0600 Onuoha, Chinwendu V, NP   14 mg at 09/07/22 0908   ondansetron (ZOFRAN-ODT) disintegrating tablet 4 mg  4 mg Oral Q8H PRN Evette Georges, NP   4 mg at 08/30/22 2122   Oxcarbazepine (TRILEPTAL) tablet 300 mg  300 mg Oral BID Onuoha, Chinwendu V, NP   300 mg at 09/07/22 0908   pantoprazole (PROTONIX) EC tablet 20 mg  20 mg Oral Daily Merrily Brittle, DO   20 mg at 09/07/22 0908   QUEtiapine (SEROQUEL) tablet 400 mg  400 mg Oral BID Onuoha, Chinwendu V, NP    400 mg at 09/07/22 0908   sertraline (ZOLOFT) tablet 150 mg  150 mg Oral Daily Onuoha, Chinwendu V, NP   150 mg at 09/07/22 0908   sodium phosphate (FLEET) 7-19 GM/118ML enema 1 enema  1 enema Rectal Once Rankin, Shuvon B, NP       traZODone (DESYREL) tablet 50 mg  50 mg Oral QHS PRN Onuoha, Chinwendu V, NP   50 mg at 09/06/22 2103   valACYclovir (VALTREX) tablet 500 mg  500 mg Oral Daily Merrily Brittle, DO   500 mg at 09/07/22 [id number]   Current Outpatient  Medications  Medication Sig Dispense Refill   ABILIFY MAINTENA 400 MG PRSY prefilled syringe 400 mg every 28 (twenty-eight) days.     cetirizine (ZYRTEC) 10 MG tablet Take 10 mg by mouth daily.     cyclobenzaprine (FLEXERIL) 10 MG tablet Take 1 tablet (10 mg total) by mouth 2 (two) times daily as needed for muscle spasms. 20 tablet 0   fluticasone (FLONASE) 50 MCG/ACT nasal spray Place 1 spray into both nostrils daily.     meloxicam (MOBIC) 15 MG tablet Take 15 mg by mouth daily.     Oxcarbazepine (TRILEPTAL) 300 MG tablet Take 1 tablet (300 mg total) by mouth 2 (two) times daily. 60 tablet 0   QUEtiapine (SEROQUEL) 400 MG tablet Take 1 tablet (400 mg total) by mouth 2 (two) times daily. 60 tablet 0   sertraline (ZOLOFT) 50 MG tablet Take 3 tablets (150 mg total) by mouth in the morning. 90 tablet 0   traZODone (DESYREL) 100 MG tablet Take 1 tablet (100 mg total) by mouth at bedtime. 30 tablet 0   valACYclovir (VALTREX) 500 MG tablet Take 500 mg by mouth daily.      Labs  Lab Results:  Admission on 07/25/2022  Component Date Value Ref Range Status   SARS Coronavirus 2 by RT PCR 07/25/2022 NEGATIVE  NEGATIVE Final   Comment: (NOTE) SARS-CoV-2 target nucleic acids are NOT DETECTED.  The SARS-CoV-2 RNA is generally detectable in upper respiratory specimens during the acute phase of infection. The lowest concentration of SARS-CoV-2 viral copies this assay can detect is 138 copies/mL. A negative result does not preclude  SARS-Cov-2 infection and should not be used as the sole basis for treatment or other patient management decisions. A negative result may occur with  improper specimen collection/handling, submission of specimen other than nasopharyngeal swab, presence of viral mutation(s) within the areas targeted by this assay, and inadequate number of viral copies(<138 copies/mL). A negative result must be combined with clinical observations, patient history, and epidemiological information. The expected result is Negative.  Fact Sheet for Patients:  EntrepreneurPulse.com.au  Fact Sheet for Healthcare Providers:  IncredibleEmployment.be  This test is no                          t yet approved or cleared by the [place] FDA and  has been authorized for detection and/or diagnosis of SARS-CoV-2 by FDA under an Emergency Use Authorization (EUA). This EUA will remain  in effect (meaning this test can be used) for the duration of the COVID-19 declaration under Section 564(b)(1) of the Act, 21 U.S.C.section 360bbb-3(b)(1), unless the authorization is terminated  or revoked sooner.       Influenza A by PCR 07/25/2022 NEGATIVE  NEGATIVE Final   Influenza B by PCR 07/25/2022 NEGATIVE  NEGATIVE Final   Comment: (NOTE) The Xpert Xpress SARS-CoV-2/FLU/RSV plus assay is intended as an aid in the diagnosis of influenza from Nasopharyngeal swab specimens and should not be used as a sole basis for treatment. Nasal washings and aspirates are unacceptable for Xpert Xpress SARS-CoV-2/FLU/RSV testing.  Fact Sheet for Patients: EntrepreneurPulse.com.au  Fact Sheet for Healthcare Providers: IncredibleEmployment.be  This test is not yet approved or cleared by the [place] FDA and has been authorized for detection and/or diagnosis of SARS-CoV-2 by FDA under an Emergency Use Authorization (EUA). This EUA will remain in effect  (meaning this test can be used) for the duration of the COVID-19 declaration under Section  564(b)(1) of the Act, 21 U.S.C. section 360bbb-3(b)(1), unless the authorization is terminated or revoked.  Performed at [facility], [address] [place], [place] [address]    WBC 07/25/2022 8.3  4.0 - 10.5 K/uL Final   RBC 07/25/2022 4.44  3.87 - 5.11 MIL/uL Final   Hemoglobin 07/25/2022 13.7  12.0 - 15.0 g/dL Final   HCT 38/75/6433 40.2  36.0 - 46.0 % Final   MCV 07/25/2022 90.5  80.0 - 100.0 fL Final   MCH 07/25/2022 30.9  26.0 - 34.0 pg Final   MCHC 07/25/2022 34.1  30.0 - 36.0 g/dL Final   RDW 29/51/8841 12.2  11.5 - 15.5 % Final   Platelets 07/25/2022 248  150 - 400 K/uL Final   nRBC 07/25/2022 0.0  0.0 - 0.2 % Final   Neutrophils Relative % 07/25/2022 43  % Final   Neutro Abs 07/25/2022 3.6  1.7 - 7.7 K/uL Final   Lymphocytes Relative 07/25/2022 52  % Final   Lymphs Abs 07/25/2022 4.2 (H)  0.7 - 4.0 K/uL Final   Monocytes Relative 07/25/2022 5  % Final   Monocytes Absolute 07/25/2022 0.4  0.1 - 1.0 K/uL Final   Eosinophils Relative 07/25/2022 0  % Final   Eosinophils Absolute 07/25/2022 0.0  0.0 - 0.5 K/uL Final   Basophils Relative 07/25/2022 0  % Final   Basophils Absolute 07/25/2022 0.0  0.0 - 0.1 K/uL Final   Immature Granulocytes 07/25/2022 0  % Final   Abs Immature Granulocytes 07/25/2022 0.02  0.00 - 0.07 K/uL Final   Performed at [facility], [address], [place], [place] [address]   Sodium 07/25/2022 138  135 - 145 mmol/L Final   Potassium 07/25/2022 4.0  3.5 - 5.1 mmol/L Final   Chloride 07/25/2022 104  98 - 111 mmol/L Final   CO2 07/25/2022 29  22 - 32 mmol/L Final   Glucose, Bld 07/25/2022 83  70 - 99 mg/dL Final   Glucose reference range applies only to samples taken after fasting for at least 8 hours.   BUN 07/25/2022 11  6 - 20 mg/dL Final   Creatinine, Ser 07/25/2022 0.97  0.44 - 1.00 mg/dL Final   Calcium 01/60/1093 9.2  8.9 - 10.3 mg/dL Final    Total Protein 07/25/2022 7.0  6.5 - 8.1 g/dL Final   Albumin 23/55/7322 3.8  3.5 - 5.0 g/dL Final   AST 02/54/2706 18  15 - 41 U/L Final   ALT 07/25/2022 22  0 - 44 U/L Final   Alkaline Phosphatase 07/25/2022 64  38 - 126 U/L Final   Total Bilirubin 07/25/2022 0.2 (L)  0.3 - 1.2 mg/dL Final   GFR, Estimated 07/25/2022 >60  >60 mL/min Final   Comment: (NOTE) Calculated using the CKD-EPI Creatinine Equation (2021)    Anion gap 07/25/2022 5  5 - 15 Final   Performed at [facility], [address], [place], [place] [address]   POC Amphetamine UR 07/25/2022 None Detected  NONE DETECTED (Cut Off Level 1000 ng/mL) Preliminary   POC Secobarbital (BAR) 07/25/2022 None Detected  NONE DETECTED (Cut Off Level 300 ng/mL) Preliminary   POC Buprenorphine (BUP) 07/25/2022 None Detected  NONE DETECTED (Cut Off Level 10 ng/mL) Preliminary   POC Oxazepam (BZO) 07/25/2022 None Detected  NONE DETECTED (Cut Off Level 300 ng/mL) Preliminary   POC Cocaine UR 07/25/2022 None Detected  NONE DETECTED (Cut Off Level 300 ng/mL) Preliminary   POC Methamphetamine UR  07/25/2022 None Detected  NONE DETECTED (Cut Off Level 1000 ng/mL) Preliminary   POC Morphine 07/25/2022 None Detected  NONE DETECTED (Cut Off Level 300 ng/mL) Preliminary   POC Methadone UR 07/25/2022 None Detected  NONE DETECTED (Cut Off Level 300 ng/mL) Preliminary   POC Oxycodone UR 07/25/2022 None Detected  NONE DETECTED (Cut Off Level 100 ng/mL) Preliminary   POC Marijuana UR 07/25/2022 None Detected  NONE DETECTED (Cut Off Level 50 ng/mL) Preliminary   SARSCOV2ONAVIRUS 2 AG 07/25/2022 NEGATIVE  NEGATIVE Final   Comment: (NOTE) SARS-CoV-2 antigen NOT DETECTED.   Negative results are presumptive.  Negative results do not preclude SARS-CoV-2 infection and should not be used as the sole basis for treatment or other patient management decisions, including infection  control decisions, particularly in the presence of clinical signs and   symptoms consistent with COVID-19, or in those who have been in contact with the virus.  Negative results must be combined with clinical observations, patient history, and epidemiological information. The expected result is Negative.  Fact Sheet for Patients: HandmadeRecipes.com.cy  Fact Sheet for Healthcare Providers: FuneralLife.at  This test is not yet approved or cleared by the [place] FDA and  has been authorized for detection and/or diagnosis of SARS-CoV-2 by FDA under an Emergency Use Authorization (EUA).  This EUA will remain in effect (meaning this test can be used) for the duration of  the COV                          ID-19 declaration under Section 564(b)(1) of the Act, 21 U.S.C. section 360bbb-3(b)(1), unless the authorization is terminated or revoked sooner.     Cholesterol 07/25/2022 181  0 - 200 mg/dL Final   Triglycerides 07/25/2022 118  <150 mg/dL Final   HDL 07/25/2022 45  >40 mg/dL Final   Total CHOL/HDL Ratio 07/25/2022 4.0  RATIO Final   VLDL 07/25/2022 24  0 - 40 mg/dL Final   LDL Cholesterol 07/25/2022 112 (H)  0 - 99 mg/dL Final   Comment:        Total Cholesterol/HDL:CHD Risk Coronary Heart Disease Risk Table                     Men   Women  1/2 Average Risk   3.4   3.3  Average Risk       5.0   4.4  2 X Average Risk   9.6   7.1  3 X Average Risk  23.4   11.0        Use the calculated Patient Ratio above and the CHD Risk Table to determine the patient's CHD Risk.        ATP III CLASSIFICATION (LDL):  <100     mg/dL   Optimal  100-129  mg/dL   Near or Above                    Optimal  130-159  mg/dL   Borderline  160-189  mg/dL   High  >190     mg/dL   Very High Performed at [facility] [address], [place], [place] [address]    TSH 07/25/2022 6.668 (H)  0.350 - 4.500 uIU/mL Final   Comment: Performed by a 3rd Generation assay with a functional sensitivity of <=0.01  uIU/mL. Performed at [facility], [place] [address], [place],  [address]    Glucose-Capillary 07/26/2022 104 (H)  70 - 99 mg/dL Final   Glucose reference range applies only to samples taken after fasting for at least 8 hours.   T3, Free 07/31/2022 2.3  2.0 - 4.4 pg/mL Final   Comment: (NOTE) Performed At: [facility] [place], [place] [id number] Rush Farmer MD [id number]    Free T4 07/31/2022 0.60 (L)  0.61 - 1.12 ng/dL Final   Comment: (NOTE) Biotin ingestion may interfere with free T4 tests. If the results are inconsistent with the TSH level, previous test results, or the clinical presentation, then consider biotin interference. If needed, order repeat testing after stopping biotin. Performed at [facility], [place] [address], [place], [place] [address]    Glucose-Capillary 08/29/2022 100 (H)  70 - 99 mg/dL Final   Glucose reference range applies only to samples taken after fasting for at least 8 hours.   TSH 09/05/2022 0.793  0.350 - 4.500 uIU/mL Final   Comment: Performed by a 3rd Generation assay with a functional sensitivity of <=0.01 uIU/mL. Performed at [facility], [place] [address], [place], [place] [address]    Free T4 09/05/2022 0.73  0.61 - 1.12 ng/dL Final   Comment: (NOTE) Biotin ingestion may interfere with free T4 tests. If the results are inconsistent with the TSH level, previous test results, or the clinical presentation, then consider biotin interference. If needed, order repeat testing after stopping biotin. Performed at [facility], [place] [address] [place], [place] [address]    Preg Test, Ur 09/06/2022 Negative  Negative Final   Preg Test, Ur 09/06/2022 NEGATIVE  NEGATIVE Final   Comment:        THE SENSITIVITY OF THIS METHODOLOGY IS >24 mIU/mL   Admission on 07/04/2022, Discharged on 07/05/2022  Component Date Value Ref Range Status   Sodium 07/04/2022 142  135 - 145 mmol/L Final   Potassium  07/04/2022 4.4  3.5 - 5.1 mmol/L Final   Chloride 07/04/2022 109  98 - 111 mmol/L Final   CO2 07/04/2022 26  22 - 32 mmol/L Final   Glucose, Bld 07/04/2022 96  70 - 99 mg/dL Final   Glucose reference range applies only to samples taken after fasting for at least 8 hours.   BUN 07/04/2022 15  6 - 20 mg/dL Final   Creatinine, Ser 07/04/2022 0.83  0.44 - 1.00 mg/dL Final   Calcium 07/04/2022 9.3  8.9 - 10.3 mg/dL Final   Total Protein 07/04/2022 7.2  6.5 - 8.1 g/dL Final   Albumin 07/04/2022 3.9  3.5 - 5.0 g/dL Final   AST 07/04/2022 23  15 - 41 U/L Final   ALT 07/04/2022 28  0 - 44 U/L Final   Alkaline Phosphatase 07/04/2022 77  38 - 126 U/L Final   Total Bilirubin 07/04/2022 0.4  0.3 - 1.2 mg/dL Final   GFR, Estimated 07/04/2022 >60  >60 mL/min Final   Comment: (NOTE) Calculated using the CKD-EPI Creatinine Equation (2021)    Anion gap 07/04/2022 7  5 - 15 Final   Performed at [facility], [place] [address], [place], [place] [address]   Alcohol, Ethyl (B) 07/04/2022 <10  <10 mg/dL Final   Comment: (NOTE) Lowest detectable limit for serum alcohol is 10 mg/dL.  For medical purposes only. Performed at [facility], [place] [address], [place], [place] [address]    WBC 07/04/2022 7.0  4.0 - 10.5 K/uL Final   RBC 07/04/2022 4.18  3.87 - 5.11 MIL/uL Final  Hemoglobin 07/04/2022 12.8  12.0 - 15.0 g/dL Final   HCT 07/04/2022 39.1  36.0 - 46.0 % Final   MCV 07/04/2022 93.5  80.0 - 100.0 fL Final   MCH 07/04/2022 30.6  26.0 - 34.0 pg Final   MCHC 07/04/2022 32.7  30.0 - 36.0 g/dL Final   RDW 07/04/2022 12.9  11.5 - 15.5 % Final   Platelets 07/04/2022 243  150 - 400 K/uL Final   nRBC 07/04/2022 0.0  0.0 - 0.2 % Final   Neutrophils Relative % 07/04/2022 43  % Final   Neutro Abs 07/04/2022 3.0  1.7 - 7.7 K/uL Final   Lymphocytes Relative 07/04/2022 50  % Final   Lymphs Abs 07/04/2022 3.5  0.7 - 4.0 K/uL Final   Monocytes Relative 07/04/2022 7  % Final    Monocytes Absolute 07/04/2022 0.5  0.1 - 1.0 K/uL Final   Eosinophils Relative 07/04/2022 0  % Final   Eosinophils Absolute 07/04/2022 0.0  0.0 - 0.5 K/uL Final   Basophils Relative 07/04/2022 0  % Final   Basophils Absolute 07/04/2022 0.0  0.0 - 0.1 K/uL Final   Immature Granulocytes 07/04/2022 0  % Final   Abs Immature Granulocytes 07/04/2022 0.01  0.00 - 0.07 K/uL Final   Performed at [facility], [place] [address], [place], [place] [address]   I-stat hCG, quantitative 07/04/2022 <5.0  <5 mIU/mL Final   Comment 3 07/04/2022          Final   Comment:   GEST. AGE      CONC.  (mIU/mL)   <=1 WEEK        5 - 50     2 WEEKS       50 - 500     3 WEEKS       100 - 10,000     4 WEEKS     1,000 - 30,000        FEMALE AND NON-PREGNANT FEMALE:     LESS THAN 5 mIU/mL   Admission on 06/14/2022, Discharged on 06/14/2022  Component Date Value Ref Range Status   Color, UA 06/14/2022 yellow  yellow Final   Clarity, UA 06/14/2022 cloudy (A)  clear Final   Glucose, UA 06/14/2022 negative  negative mg/dL Final   Bilirubin, UA 06/14/2022 negative  negative Final   Ketones, POC UA 06/14/2022 negative  negative mg/dL Final   Spec Grav, UA 06/14/2022 1.025  1.010 - 1.025 Final   Blood, UA 06/14/2022 negative  negative Final   pH, UA 06/14/2022 7.5  5.0 - 8.0 Final   Protein Ur, POC 06/14/2022 =30 (A)  negative mg/dL Final   Urobilinogen, UA 06/14/2022 0.2  0.2 or 1.0 E.U./dL Final   Nitrite, UA 06/14/2022 Negative  Negative Final   Leukocytes, UA 06/14/2022 Negative  Negative Final   Preg Test, Ur 06/14/2022 Negative  Negative Final   Specimen Description 06/14/2022 URINE, CLEAN CATCH   Final   Special Requests 06/14/2022    Final                   Value:NONE Performed at [facility], [place] [address], [place], [place] [address]    Culture 06/14/2022 MULTIPLE SPECIES PRESENT, SUGGEST RECOLLECTION (A)   Final   Report Status 06/14/2022 06/16/2022 FINAL   Final  Admission on  06/07/2022, Discharged on 06/10/2022  Component Date Value Ref Range Status   SARS Coronavirus 2 by RT PCR 06/07/2022 NEGATIVE  NEGATIVE Final  Comment: (NOTE) SARS-CoV-2 target nucleic acids are NOT DETECTED.  The SARS-CoV-2 RNA is generally detectable in upper respiratory specimens during the acute phase of infection. The lowest concentration of SARS-CoV-2 viral copies this assay can detect is 138 copies/mL. A negative result does not preclude SARS-Cov-2 infection and should not be used as the sole basis for treatment or other patient management decisions. A negative result may occur with  improper specimen collection/handling, submission of specimen other than nasopharyngeal swab, presence of viral mutation(s) within the areas targeted by this assay, and inadequate number of viral copies(<138 copies/mL). A negative result must be combined with clinical observations, patient history, and epidemiological information. The expected result is Negative.  Fact Sheet for Patients:  EntrepreneurPulse.com.au  Fact Sheet for Healthcare Providers:  IncredibleEmployment.be  This test is no                          t yet approved or cleared by the [place] FDA and  has been authorized for detection and/or diagnosis of SARS-CoV-2 by FDA under an Emergency Use Authorization (EUA). This EUA will remain  in effect (meaning this test can be used) for the duration of the COVID-19 declaration under Section 564(b)(1) of the Act, 21 U.S.C.section 360bbb-3(b)(1), unless the authorization is terminated  or revoked sooner.       Influenza A by PCR 06/07/2022 NEGATIVE  NEGATIVE Final   Influenza B by PCR 06/07/2022 NEGATIVE  NEGATIVE Final   Comment: (NOTE) The Xpert Xpress SARS-CoV-2/FLU/RSV plus assay is intended as an aid in the diagnosis of influenza from Nasopharyngeal swab specimens and should not be used as a sole basis for treatment. Nasal washings  and aspirates are unacceptable for Xpert Xpress SARS-CoV-2/FLU/RSV testing.  Fact Sheet for Patients: EntrepreneurPulse.com.au  Fact Sheet for Healthcare Providers: IncredibleEmployment.be  This test is not yet approved or cleared by the [place] FDA and has been authorized for detection and/or diagnosis of SARS-CoV-2 by FDA under an Emergency Use Authorization (EUA). This EUA will remain in effect (meaning this test can be used) for the duration of the COVID-19 declaration under Section 564(b)(1) of the Act, 21 U.S.C. section 360bbb-3(b)(1), unless the authorization is terminated or revoked.  Performed at [facility], [place] [address], [place], [place] [address]    WBC 06/07/2022 5.7  4.0 - 10.5 K/uL Final   RBC 06/07/2022 4.39  3.87 - 5.11 MIL/uL Final   Hemoglobin 06/07/2022 13.2  12.0 - 15.0 g/dL Final   HCT 06/07/2022 40.4  36.0 - 46.0 % Final   MCV 06/07/2022 92.0  80.0 - 100.0 fL Final   MCH 06/07/2022 30.1  26.0 - 34.0 pg Final   MCHC 06/07/2022 32.7  30.0 - 36.0 g/dL Final   RDW 06/07/2022 12.4  11.5 - 15.5 % Final   Platelets 06/07/2022 308  150 - 400 K/uL Final   nRBC 06/07/2022 0.0  0.0 - 0.2 % Final   Neutrophils Relative % 06/07/2022 42  % Final   Neutro Abs 06/07/2022 2.4  1.7 - 7.7 K/uL Final   Lymphocytes Relative 06/07/2022 54  % Final   Lymphs Abs 06/07/2022 3.1  0.7 - 4.0 K/uL Final   Monocytes Relative 06/07/2022 4  % Final   Monocytes Absolute 06/07/2022 0.2  0.1 - 1.0 K/uL Final   Eosinophils Relative 06/07/2022 0  % Final   Eosinophils Absolute 06/07/2022 0.0  0.0 - 0.5 K/uL Final   Basophils Relative 06/07/2022 0  %  Final   Basophils Absolute 06/07/2022 0.0  0.0 - 0.1 K/uL Final   Immature Granulocytes 06/07/2022 0  % Final   Abs Immature Granulocytes 06/07/2022 0.01  0.00 - 0.07 K/uL Final   Performed at [facility], [place] [address], [place], [place] [address]   Sodium 06/07/2022 139  135 - 145  mmol/L Final   Potassium 06/07/2022 4.0  3.5 - 5.1 mmol/L Final   Chloride 06/07/2022 104  98 - 111 mmol/L Final   CO2 06/07/2022 28  22 - 32 mmol/L Final   Glucose, Bld 06/07/2022 104 (H)  70 - 99 mg/dL Final   Glucose reference range applies only to samples taken after fasting for at least 8 hours.   BUN 06/07/2022 8  6 - 20 mg/dL Final   Creatinine, Ser 06/07/2022 0.84  0.44 - 1.00 mg/dL Final   Calcium 06/07/2022 9.1  8.9 - 10.3 mg/dL Final   Total Protein 06/07/2022 6.9  6.5 - 8.1 g/dL Final   Albumin 06/07/2022 3.7  3.5 - 5.0 g/dL Final   AST 06/07/2022 19  15 - 41 U/L Final   ALT 06/07/2022 22  0 - 44 U/L Final   Alkaline Phosphatase 06/07/2022 54  38 - 126 U/L Final   Total Bilirubin 06/07/2022 0.4  0.3 - 1.2 mg/dL Final   GFR, Estimated 06/07/2022 >60  >60 mL/min Final   Comment: (NOTE) Calculated using the CKD-EPI Creatinine Equation (2021)    Anion gap 06/07/2022 7  5 - 15 Final   Performed at [facility] [address] [place], [place] [address]   Hgb A1c MFr Bld 06/07/2022 5.0  4.8 - 5.6 % Final   Comment: (NOTE) Pre diabetes:          5.7%-6.4%  Diabetes:              >6.4%  Glycemic control for   <7.0% adults with diabetes    Mean Plasma Glucose 06/07/2022 96.8  mg/dL Final   Performed at [facility], [place] [address] [place], [place] [address]   TSH 06/07/2022 1.620  0.350 - 4.500 uIU/mL Final   Comment: Performed by a 3rd Generation assay with a functional sensitivity of <=0.01 uIU/mL. Performed at [facility], [place] [address] [place], [place] [address]    RPR Ser Ql 06/07/2022 NON REACTIVE  NON REACTIVE Final   Performed at [facility], [place] [address] [place], [place] [address]   Color, Urine 06/07/2022 YELLOW  YELLOW Final   APPearance 06/07/2022 HAZY (A)  CLEAR Final   Specific Gravity, Urine 06/07/2022 1.018  1.005 - 1.030 Final   pH 06/07/2022 7.0  5.0 - 8.0 Final   Glucose, UA 06/07/2022 NEGATIVE  NEGATIVE mg/dL Final    Hgb urine dipstick 06/07/2022 NEGATIVE  NEGATIVE Final   Bilirubin Urine 06/07/2022 NEGATIVE  NEGATIVE Final   Ketones, ur 06/07/2022 NEGATIVE  NEGATIVE mg/dL Final   Protein, ur 06/07/2022 NEGATIVE  NEGATIVE mg/dL Final   Nitrite 06/07/2022 NEGATIVE  NEGATIVE Final   Leukocytes,Ua 06/07/2022 NEGATIVE  NEGATIVE Final   Performed at [facility], [place] [address] [place], [place] [address]   Cholesterol 06/07/2022 164  0 - 200 mg/dL Final   Triglycerides 06/07/2022 158 (H)  <150 mg/dL Final   HDL 06/07/2022 44  >40 mg/dL Final   Total CHOL/HDL Ratio 06/07/2022 3.7  RATIO Final   VLDL 06/07/2022 32  0 - 40 mg/dL Final  LDL Cholesterol 06/07/2022 88  0 - 99 mg/dL Final   Comment:        Total Cholesterol/HDL:CHD Risk Coronary Heart Disease Risk Table                     Men   Women  1/2 Average Risk   3.4   3.3  Average Risk       5.0   4.4  2 X Average Risk   9.6   7.1  3 X Average Risk  23.4   11.0        Use the calculated Patient Ratio above and the CHD Risk Table to determine the patient's CHD Risk.        ATP III CLASSIFICATION (LDL):  <100     mg/dL   Optimal  100-129  mg/dL   Near or Above                    Optimal  130-159  mg/dL   Borderline  160-189  mg/dL   High  >190     mg/dL   Very High Performed at [place] [address], [place], [place] [address]    HIV Screen 4th Generation wRfx 06/07/2022 Non Reactive  Non Reactive Final   Performed at [facility], [place] [address], [place], [place] [address]   SARSCOV2ONAVIRUS 2 AG 06/07/2022 NEGATIVE  NEGATIVE Final   Comment: (NOTE) SARS-CoV-2 antigen NOT DETECTED.   Negative results are presumptive.  Negative results do not preclude SARS-CoV-2 infection and should not be used as the sole basis for treatment or other patient management decisions, including infection  control decisions, particularly in the presence of clinical signs and  symptoms consistent with COVID-19, or in those who have  been in contact with the virus.  Negative results must be combined with clinical observations, patient history, and epidemiological information. The expected result is Negative.  Fact Sheet for Patients: HandmadeRecipes.com.cy  Fact Sheet for Healthcare Providers: FuneralLife.at  This test is not yet approved or cleared by the [place] FDA and  has been authorized for detection and/or diagnosis of SARS-CoV-2 by FDA under an Emergency Use Authorization (EUA).  This EUA will remain in effect (meaning this test can be used) for the duration of  the COV                          ID-19 declaration under Section 564(b)(1) of the Act, 21 U.S.C. section 360bbb-3(b)(1), unless the authorization is terminated or revoked sooner.     POC Amphetamine UR 06/07/2022 None Detected  NONE DETECTED (Cut Off Level 1000 ng/mL) Final   POC Secobarbital (BAR) 06/07/2022 None Detected  NONE DETECTED (Cut Off Level 300 ng/mL) Final   POC Buprenorphine (BUP) 06/07/2022 None Detected  NONE DETECTED (Cut Off Level 10 ng/mL) Final   POC Oxazepam (BZO) 06/07/2022 None Detected  NONE DETECTED (Cut Off Level 300 ng/mL) Final   POC Cocaine UR 06/07/2022 None Detected  NONE DETECTED (Cut Off Level 300 ng/mL) Final   POC Methamphetamine UR 06/07/2022 None Detected  NONE DETECTED (Cut Off Level 1000 ng/mL) Final   POC Morphine 06/07/2022 None Detected  NONE DETECTED (Cut Off Level 300 ng/mL) Final   POC Methadone UR 06/07/2022 None Detected  NONE DETECTED (Cut Off Level 300 ng/mL) Final   POC Oxycodone UR 06/07/2022 None Detected  NONE DETECTED (Cut Off Level 100 ng/mL) Final  POC Marijuana UR 06/07/2022 None Detected  NONE DETECTED (Cut Off Level 50 ng/mL) Final   Preg Test, Ur 06/08/2022 NEGATIVE  NEGATIVE Final   Comment:        THE SENSITIVITY OF THIS METHODOLOGY IS >20 mIU/mL. Performed at [facility], [place] [address] [place], [place] [address]    Preg  Test, Ur 06/08/2022 NEGATIVE  NEGATIVE Final   Comment:        THE SENSITIVITY OF THIS METHODOLOGY IS >24 mIU/mL     Blood Alcohol level:  Lab Results  Component Value Date   ETH <10 07/04/2022   ETH <10 [id number]    Metabolic Disorder Labs: Lab Results  Component Value Date   HGBA1C 5.0 06/07/2022   MPG 96.8 06/07/2022   MPG 96.8 02/07/2022   No results found for: "PROLACTIN" Lab Results  Component Value Date   CHOL 181 07/25/2022   TRIG 118 07/25/2022   HDL 45 07/25/2022   CHOLHDL 4.0 07/25/2022   VLDL 24 07/25/2022   LDLCALC 112 (H) 07/25/2022   LDLCALC 88 06/07/2022    Therapeutic Lab Levels: No results found for: "LITHIUM" No results found for: "VALPROATE" No results found for: "CBMZ"  Physical Findings   GAD-7    Flowsheet Row Office Visit from 12/17/2021 in [facility]  Total GAD-7 Score 17      PHQ2-9    [facility] from [facility]/07/2022 in [facility] Office Visit from 12/17/2021 in [place]  PHQ-2 Total Score 1 2  PHQ-9 Total Score 2 8      Flowsheet Row ED from 07/25/2022 in [facility] ED from 07/04/2022 in [facility] ED from 06/14/2022 in [facility] [place] CATEGORY High Risk No Risk No Risk        Musculoskeletal  Strength & Muscle Tone: within normal limits Gait & Station: normal Patient leans: N/A  Psychiatric Specialty Exam  Presentation  General Appearance:  Appropriate for Environment  Eye Contact: Fair  Speech: Clear and Coherent  Speech Volume: Normal  Handedness: Right   Mood and Affect  Mood: Euthymic  Affect: Congruent   Thought Process  Thought Processes: Coherent  Descriptions of Associations:Intact  Orientation:Full (Time, Place and Person)  Thought Content:Logical  Diagnosis of Schizophrenia or Schizoaffective  disorder in past: No  Duration of Psychotic Symptoms: Less than six months   Hallucinations:Hallucinations: None  Ideas of Reference:None  Suicidal Thoughts:Suicidal Thoughts: No  Homicidal Thoughts:Homicidal Thoughts: No   Sensorium  Memory: Immediate Fair; Recent Fair; Remote Fair  Judgment: Intact  Insight: Present   Executive Functions  Concentration: Fair  Attention Span: Fair  Recall: [employer] of Knowledge: Fair  Language: Fair   Psychomotor Activity  Psychomotor Activity: Psychomotor Activity: Normal   Assets  Assets: Communication Skills; Desire for Improvement   Sleep  Sleep: Sleep: Fair Number of Hours of Sleep: 8   No data recorded  Physical Exam  Physical Exam HENT:     Head: Normocephalic.     Nose: Nose normal.  Eyes:     Conjunctiva/sclera: Conjunctivae normal.  Cardiovascular:     Rate and Rhythm: Normal rate.  Pulmonary:     Effort: Pulmonary effort is normal.  Musculoskeletal:        General: Normal range of motion.     Cervical back: Normal range of motion.  Neurological:  Mental Status: She is alert and oriented to person, place, and time.    Review of Systems  Constitutional: Negative.   HENT: Negative.    Eyes: Negative.   Respiratory: Negative.    Cardiovascular: Negative.   Gastrointestinal: Negative.   Genitourinary: Negative.   Musculoskeletal: Negative.   Neurological: Negative.   Endo/Heme/Allergies: Negative.    Blood pressure 107/81, pulse 100, temperature (!) 97.5 F (36.4 C), temperature source Oral, resp. rate 16, SpO2 100 %. There is no height or weight on file to calculate BMI.  Treatment Plan Summary: Patient psychiatrically cleared. Patient is currently boarding at the [facility], pending appropriate placement. 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  Vital signs reviewed and WNL.   Labs reviewed:  Preg is negative TSH and T4 are WNL  Current medication regimen. No medication changes on 09/07/22   ARIPiprazole ER  400 mg Intramuscular Q28 days   clotrimazole   Topical BID   docusate sodium  100 mg Oral Daily   levothyroxine  100 mcg Oral Q0600   nicotine  14 mg Transdermal Q0600   Oxcarbazepine  300 mg Oral BID   pantoprazole  20 mg Oral Daily   QUEtiapine  400 mg Oral BID   sertraline  150 mg Oral Daily   sodium phosphate  1 enema Rectal Once   valACYclovir  500 mg Oral Daily    Locklan Canoy L, NP 09/07/2022 11:22 AM

## 2022-09-07 NOTE — ED Notes (Signed)
Pt resting quietly, breathing is even and unlabored.  Pt denies SI, HI, pain and AVH.  Will continue to monitor for safety.  

## 2022-09-07 NOTE — ED Notes (Signed)
Pt had a shower. 

## 2022-09-07 NOTE — ED Notes (Signed)
Pt A&O x 4, no distress noted. Watching TV at present.  Monitoring for safety.

## 2022-09-07 NOTE — ED Notes (Signed)
Pt sleeping@this time. Breathing even and unlabored. Will continue to monitor for safety 

## 2022-09-08 DIAGNOSIS — F333 Major depressive disorder, recurrent, severe with psychotic symptoms: Secondary | ICD-10-CM | POA: Diagnosis not present

## 2022-09-08 MED ORDER — FLEET ENEMA 7-19 GM/118ML RE ENEM
1.0000 | ENEMA | Freq: Once | RECTAL | Status: DC
  Filled 2022-09-08: qty 1

## 2022-09-08 MED ORDER — MAGNESIUM CITRATE PO SOLN
1.0000 | Freq: Once | ORAL | Status: AC
  Administered 2022-09-08: 1 via ORAL
  Filled 2022-09-08: qty 296

## 2022-09-08 NOTE — ED Notes (Signed)
Patient was escorted by this nurse to get two pair of leggings from her locker.

## 2022-09-08 NOTE — ED Notes (Signed)
Pt sleeping at present, no distress noted.  Monitoring for safety. 

## 2022-09-08 NOTE — ED Provider Notes (Signed)
Behavioral Health Progress Note  Date and Time: 09/08/2022 2:31 PM Name: Nicole MassonLatasha Kilner MRN:  [id number]  Subjective: Nicole [name] is awaiting placement via social work disposition.  She is awake, alert and oriented x 3.  Denying suicidal or homicidal ideations.  Denies auditory visual hallucinations.  She reports she is hopeful to speak to [facility] on tomorrow 09/09/2022 in order to get a update on her placement status for a alternative family living ( AFL )/group home placement.  Patient has been medication compliant while on the unit.  No overt behaviors noted.  Reports a good appetite.  States she is resting well throughout the night.  Support and encouragement, reassurance was provided.    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  Per admission assessment note: " Nicole MassonLatasha Searson is a 31 y.o. female, with PMH of SCZ unspecified, MDD with psychosis, who [facility] via [facility] after a verbal altercation with Jeannett SeniorJosephine Okeke (not patient's legal guardian) for transient SI. This is [facility] concerns this year.Patient has been dismissed from previous group home due to threatening SI and HI, then leaving the group home.   Legal Guardian: [name](Maxine Brown) transitioning to be [name](Albert Brown) Point of contact: [name](Albert Brown)"   Diagnosis:  Final diagnoses:  Severe episode of recurrent major depressive disorder, with psychotic features (HCC)    Total Time spent with patient: 15 minutes  Past Psychiatric History:  Past Medical History:  Past Medical History:  Diagnosis Date   Anxiety    Depression    Hypothyroidism 08/07/2022   Tobacco use disorder 08/07/2022    Past  Surgical History:  Procedure Laterality Date   WISDOM TOOTH EXTRACTION Bilateral 2020   Family History:  Family History  Problem Relation Age of Onset   Hypertension Father    Diabetes Father    Family Psychiatric  History:  Social History:  Social History   Substance and Sexual Activity  Alcohol Use Never     Social History   Substance and Sexual Activity  Drug Use Never    Social History   Socioeconomic History   Marital status: Single    Spouse name: Not on file   Number of children: Not on file   Years of education: Not on file   Highest education level: Not on file  Occupational History   Not on file  Tobacco Use   Smoking status: Every Day    Types: Cigarettes   Smokeless tobacco: Never  Vaping Use   Vaping Use: Some days  Substance and Sexual Activity   Alcohol use: Never   Drug use: Never   Sexual activity: Yes    Partners: Female    Birth control/protection: Implant  Other Topics Concern   Not on file  Social History Narrative   Not on file   Social Determinants of Health   Financial Resource Strain: Not on file  Food Insecurity: Not on file  Transportation Needs: Not on file  Physical Activity: Not on file  Stress: Not on file  Social Connections: Not on file   SDOH:  SDOH Screenings   Depression (PHQ2-9): Low Risk  (06/10/2022)  Tobacco Use: High Risk (09/01/2022)   Additional Social History:  Pain Medications: SEE MAR Prescriptions: SEE MAR Over the Counter: SEE MAR History of alcohol / drug use?: Yes Longest period of sobriety (when/how long): N/A Negative Consequences of Use:  (NONE) Withdrawal Symptoms: None Name of Substance 1: ETOH IN PAST--PT CURRENTLY DENIES 1 - Frequency: SOCIAL USE ETOH IN PAST 1 - Method of Aquiring: LEGAL 1- Route of Use: ORAL DRINK Name of Substance 2: NICOTINE-VAPE AND CIGARETTES 2 - Amount (size/oz): LESS THAN ONE PACK 2 - Frequency: DAILY 2 - Method of Aquiring: LEGAL 2 - Route of Substance  Use: ORAL SMOKE                Sleep: Good  Appetite:  Good  Current Medications:  Current Facility-Administered Medications  Medication Dose Route Frequency Provider Last Rate Last Admin   acetaminophen (TYLENOL) tablet 650 mg  650 mg Oral Q6H PRN Onuoha, Chinwendu V, NP   650 mg at 09/05/22 2219   alum & mag hydroxide-simeth (MAALOX/MYLANTA) 200-200-20 MG/5ML suspension 30 mL  30 mL Oral Q4H PRN Onuoha, Chinwendu V, NP   30 mL at 07/27/22 2151   ARIPiprazole ER (ABILIFY MAINTENA) injection 400 mg  400 mg Intramuscular Q28 days Princess Bruins, DO   400 mg at 08/30/22 1338   clotrimazole (LOTRIMIN) 1 % cream   Topical BID Bobbitt, Shalon E, NP   Given at 09/07/22 0907   docusate sodium (COLACE) capsule 100 mg  100 mg Oral Daily [name], MD   100 mg at 09/08/22 1204   hydrOXYzine (ATARAX) tablet 25 mg  25 mg Oral TID PRN Onuoha, Chinwendu V, NP   25 mg at 09/07/22 2113   ibuprofen (ADVIL) tablet 400 mg  400 mg Oral Q6H PRN Onuoha, Chinwendu V, NP   400 mg at 09/06/22 1314   levothyroxine (SYNTHROID) tablet 100 mcg  100 mcg Oral Q0600 Princess Bruins, DO   100 mcg at 09/08/22 7341   ziprasidone (GEODON) injection 20 mg  20 mg Intramuscular Q12H PRN Onuoha, Chinwendu V, NP       And   LORazepam (ATIVAN) tablet 1 mg  1 mg Oral PRN Onuoha, Chinwendu V, NP       nicotine (NICODERM CQ - dosed in mg/24 hours) patch 14 mg  14 mg Transdermal Q0600 Onuoha, Chinwendu V, NP   14 mg at 09/08/22 1002   ondansetron (ZOFRAN-ODT) disintegrating tablet 4 mg  4 mg Oral Q8H PRN Sindy Guadeloupe, NP   4 mg at 08/30/22 2122   Oxcarbazepine (TRILEPTAL) tablet 300 mg  300 mg Oral BID Onuoha, Chinwendu V, NP   300 mg at 09/08/22 1158   pantoprazole (PROTONIX) EC tablet 20 mg  20 mg Oral Daily Princess Bruins, DO   20 mg at 09/08/22 1005   QUEtiapine (SEROQUEL) tablet 400 mg  400 mg Oral BID Onuoha, Chinwendu V, NP   400 mg at 09/08/22 1005   sertraline (ZOLOFT) tablet 150 mg  150 mg Oral Daily Onuoha,  Chinwendu V, NP   150 mg at 09/08/22 1005   sodium phosphate (FLEET) 7-19 GM/118ML enema 1 enema  1 enema Rectal Once Rankin, Shuvon B, NP       traZODone (DESYREL) tablet 50 mg  50 mg Oral QHS PRN Onuoha, Chinwendu V, NP   50 mg at 09/07/22 2113   valACYclovir (VALTREX) tablet 500 mg  500 mg Oral Daily Princess Bruins, DO   500 mg at 09/08/22 1205   Current Outpatient Medications  Medication Sig Dispense Refill   ABILIFY  MAINTENA 400 MG PRSY prefilled syringe 400 mg every 28 (twenty-eight) days.     cetirizine (ZYRTEC) 10 MG tablet Take 10 mg by mouth daily.     cyclobenzaprine (FLEXERIL) 10 MG tablet Take 1 tablet (10 mg total) by mouth 2 (two) times daily as needed for muscle spasms. 20 tablet 0   fluticasone (FLONASE) 50 MCG/ACT nasal spray Place 1 spray into both nostrils daily.     meloxicam (MOBIC) 15 MG tablet Take 15 mg by mouth daily.     Oxcarbazepine (TRILEPTAL) 300 MG tablet Take 1 tablet (300 mg total) by mouth 2 (two) times daily. 60 tablet 0   QUEtiapine (SEROQUEL) 400 MG tablet Take 1 tablet (400 mg total) by mouth 2 (two) times daily. 60 tablet 0   sertraline (ZOLOFT) 50 MG tablet Take 3 tablets (150 mg total) by mouth in the morning. 90 tablet 0   traZODone (DESYREL) 100 MG tablet Take 1 tablet (100 mg total) by mouth at bedtime. 30 tablet 0   valACYclovir (VALTREX) 500 MG tablet Take 500 mg by mouth daily.      Labs  Lab Results:  Admission on 07/25/2022  Component Date Value Ref Range Status   SARS Coronavirus 2 by RT PCR 07/25/2022 NEGATIVE  NEGATIVE Final   Comment: (NOTE) SARS-CoV-2 target nucleic acids are NOT DETECTED.  The SARS-CoV-2 RNA is generally detectable in upper respiratory specimens during the acute phase of infection. The lowest concentration of SARS-CoV-2 viral copies this assay can detect is 138 copies/mL. A negative result does not preclude SARS-Cov-2 infection and should not be used as the sole basis for treatment or other patient management  decisions. A negative result may occur with  improper specimen collection/handling, submission of specimen other than nasopharyngeal swab, presence of viral mutation(s) within the areas targeted by this assay, and inadequate number of viral copies(<138 copies/mL). A negative result must be combined with clinical observations, patient history, and epidemiological information. The expected result is Negative.  Fact Sheet for Patients:  BloggerCourse.com  Fact Sheet for Healthcare Providers:  SeriousBroker.it  This test is no                          t yet approved or cleared by the [place] FDA and  has been authorized for detection and/or diagnosis of SARS-CoV-2 by FDA under an Emergency Use Authorization (EUA). This EUA will remain  in effect (meaning this test can be used) for the duration of the COVID-19 declaration under Section 564(b)(1) of the Act, 21 U.S.C.section 360bbb-3(b)(1), unless the authorization is terminated  or revoked sooner.       Influenza A by PCR 07/25/2022 NEGATIVE  NEGATIVE Final   Influenza B by PCR 07/25/2022 NEGATIVE  NEGATIVE Final   Comment: (NOTE) The Xpert Xpress SARS-CoV-2/FLU/RSV plus assay is intended as an aid in the diagnosis of influenza from Nasopharyngeal swab specimens and should not be used as a sole basis for treatment. Nasal washings and aspirates are unacceptable for Xpert Xpress SARS-CoV-2/FLU/RSV testing.  Fact Sheet for Patients: BloggerCourse.com  Fact Sheet for Healthcare Providers: SeriousBroker.it  This test is not yet approved or cleared by the [place] FDA and has been authorized for detection and/or diagnosis of SARS-CoV-2 by FDA under an Emergency Use Authorization (EUA). This EUA will remain in effect (meaning this test can be used) for the duration of the COVID-19 declaration under Section 564(b)(1) of the Act,  21 U.S.C. section 360bbb-3(b)(1), unless  the authorization is terminated or revoked.  Performed at [facility], [address], [place], [place] [address]    WBC 07/25/2022 8.3  4.0 - 10.5 K/uL Final   RBC 07/25/2022 4.44  3.87 - 5.11 MIL/uL Final   Hemoglobin 07/25/2022 13.7  12.0 - 15.0 g/dL Final   HCT 60/45/4098 40.2  36.0 - 46.0 % Final   MCV 07/25/2022 90.5  80.0 - 100.0 fL Final   MCH 07/25/2022 30.9  26.0 - 34.0 pg Final   MCHC 07/25/2022 34.1  30.0 - 36.0 g/dL Final   RDW 11/91/4782 12.2  11.5 - 15.5 % Final   Platelets 07/25/2022 248  150 - 400 K/uL Final   nRBC 07/25/2022 0.0  0.0 - 0.2 % Final   Neutrophils Relative % 07/25/2022 43  % Final   Neutro Abs 07/25/2022 3.6  1.7 - 7.7 K/uL Final   Lymphocytes Relative 07/25/2022 52  % Final   Lymphs Abs 07/25/2022 4.2 (H)  0.7 - 4.0 K/uL Final   Monocytes Relative 07/25/2022 5  % Final   Monocytes Absolute 07/25/2022 0.4  0.1 - 1.0 K/uL Final   Eosinophils Relative 07/25/2022 0  % Final   Eosinophils Absolute 07/25/2022 0.0  0.0 - 0.5 K/uL Final   Basophils Relative 07/25/2022 0  % Final   Basophils Absolute 07/25/2022 0.0  0.0 - 0.1 K/uL Final   Immature Granulocytes 07/25/2022 0  % Final   Abs Immature Granulocytes 07/25/2022 0.02  0.00 - 0.07 K/uL Final   Performed at [facility], [address], [place], [place] [address]   Sodium 07/25/2022 138  135 - 145 mmol/L Final   Potassium 07/25/2022 4.0  3.5 - 5.1 mmol/L Final   Chloride 07/25/2022 104  98 - 111 mmol/L Final   CO2 07/25/2022 29  22 - 32 mmol/L Final   Glucose, Bld 07/25/2022 83  70 - 99 mg/dL Final   Glucose reference range applies only to samples taken after fasting for at least 8 hours.   BUN 07/25/2022 11  6 - 20 mg/dL Final   Creatinine, Ser 07/25/2022 0.97  0.44 - 1.00 mg/dL Final   Calcium 30/86/5784 9.2  8.9 - 10.3 mg/dL Final   Total Protein 69/62/9528 7.0  6.5 - 8.1 g/dL Final   Albumin 41/32/4401 3.8  3.5 - 5.0 g/dL Final   AST  02/72/5366 18  15 - 41 U/L Final   ALT 07/25/2022 22  0 - 44 U/L Final   Alkaline Phosphatase 07/25/2022 64  38 - 126 U/L Final   Total Bilirubin 07/25/2022 0.2 (L)  0.3 - 1.2 mg/dL Final   GFR, Estimated 07/25/2022 >60  >60 mL/min Final   Comment: (NOTE) Calculated using the CKD-EPI Creatinine Equation (2021)    Anion gap 07/25/2022 5  5 - 15 Final   Performed at [facility], [address], [place], [place] [address]   POC Amphetamine UR 07/25/2022 None Detected  NONE DETECTED (Cut Off Level 1000 ng/mL) Preliminary   POC Secobarbital (BAR) 07/25/2022 None Detected  NONE DETECTED (Cut Off Level 300 ng/mL) Preliminary   POC Buprenorphine (BUP) 07/25/2022 None Detected  NONE DETECTED (Cut Off Level 10 ng/mL) Preliminary   POC Oxazepam (BZO) 07/25/2022 None Detected  NONE DETECTED (Cut Off Level 300 ng/mL) Preliminary   POC Cocaine UR 07/25/2022 None Detected  NONE DETECTED (Cut Off Level 300 ng/mL) Preliminary   POC Methamphetamine UR 07/25/2022 None Detected  NONE DETECTED (Cut Off Level  1000 ng/mL) Preliminary   POC Morphine 07/25/2022 None Detected  NONE DETECTED (Cut Off Level 300 ng/mL) Preliminary   POC Methadone UR 07/25/2022 None Detected  NONE DETECTED (Cut Off Level 300 ng/mL) Preliminary   POC Oxycodone UR 07/25/2022 None Detected  NONE DETECTED (Cut Off Level 100 ng/mL) Preliminary   POC Marijuana UR 07/25/2022 None Detected  NONE DETECTED (Cut Off Level 50 ng/mL) Preliminary   SARSCOV2ONAVIRUS 2 AG 07/25/2022 NEGATIVE  NEGATIVE Final   Comment: (NOTE) SARS-CoV-2 antigen NOT DETECTED.   Negative results are presumptive.  Negative results do not preclude SARS-CoV-2 infection and should not be used as the sole basis for treatment or other patient management decisions, including infection  control decisions, particularly in the presence of clinical signs and  symptoms consistent with COVID-19, or in those who have been in contact with the virus.  Negative results must be  combined with clinical observations, patient history, and epidemiological information. The expected result is Negative.  Fact Sheet for Patients: https://www.jennings-kim.com/  Fact Sheet for Healthcare Providers: https://alexander-rogers.biz/  This test is not yet approved or cleared by the [place] FDA and  has been authorized for detection and/or diagnosis of SARS-CoV-2 by FDA under an Emergency Use Authorization (EUA).  This EUA will remain in effect (meaning this test can be used) for the duration of  the COV                          ID-19 declaration under Section 564(b)(1) of the Act, 21 U.S.C. section 360bbb-3(b)(1), unless the authorization is terminated or revoked sooner.     Cholesterol 07/25/2022 181  0 - 200 mg/dL Final   Triglycerides 40/98/1191 118  <150 mg/dL Final   HDL 47/82/9562 45  >40 mg/dL Final   Total CHOL/HDL Ratio 07/25/2022 4.0  RATIO Final   VLDL 07/25/2022 24  0 - 40 mg/dL Final   LDL Cholesterol 07/25/2022 112 (H)  0 - 99 mg/dL Final   Comment:        Total Cholesterol/HDL:CHD Risk Coronary Heart Disease Risk Table                     Men   Women  1/2 Average Risk   3.4   3.3  Average Risk       5.0   4.4  2 X Average Risk   9.6   7.1  3 X Average Risk  23.4   11.0        Use the calculated Patient Ratio above and the CHD Risk Table to determine the patient's CHD Risk.        ATP III CLASSIFICATION (LDL):  <100     mg/dL   Optimal  130-865  mg/dL   Near or Above                    Optimal  130-159  mg/dL   Borderline  784-696  mg/dL   High  >295     mg/dL   Very High Performed at [facility], [address], [place], [place] [address]    TSH 07/25/2022 6.668 (H)  0.350 - 4.500 uIU/mL Final   Comment: Performed by a 3rd Generation assay with a functional sensitivity of <=0.01 uIU/mL. Performed at [facility], [address] [place], [place] [address]    Glucose-Capillary 07/26/2022 104 (H)  70  - 99 mg/dL Final   Glucose  reference range applies only to samples taken after fasting for at least 8 hours.   T3, Free 07/31/2022 2.3  2.0 - 4.4 pg/mL Final   Comment: (NOTE) Performed At: [facility] [address] [place], [place] [id number] Jolene Schimke MD NW:[id number]    Free T4 07/31/2022 0.60 (L)  0.61 - 1.12 ng/dL Final   Comment: (NOTE) Biotin ingestion may interfere with free T4 tests. If the results are inconsistent with the TSH level, previous test results, or the clinical presentation, then consider biotin interference. If needed, order repeat testing after stopping biotin. Performed at [facility], [address], [place], [place] [address]    Glucose-Capillary 08/29/2022 100 (H)  70 - 99 mg/dL Final   Glucose reference range applies only to samples taken after fasting for at least 8 hours.   TSH 09/05/2022 0.793  0.350 - 4.500 uIU/mL Final   Comment: Performed by a 3rd Generation assay with a functional sensitivity of <=0.01 uIU/mL. Performed at [facility], [address], [place], [place] [address]    Free T4 09/05/2022 0.73  0.61 - 1.12 ng/dL Final   Comment: (NOTE) Biotin ingestion may interfere with free T4 tests. If the results are inconsistent with the TSH level, previous test results, or the clinical presentation, then consider biotin interference. If needed, order repeat testing after stopping biotin. Performed at [facility], [address], [place], [place] [address]    Preg Test, Ur 09/06/2022 Negative  Negative Final   Preg Test, Ur 09/06/2022 NEGATIVE  NEGATIVE Final   Comment:        THE SENSITIVITY OF THIS METHODOLOGY IS >24 mIU/mL   Admission on 07/04/2022, Discharged on 07/05/2022  Component Date Value Ref Range Status   Sodium 07/04/2022 142  135 - 145 mmol/L Final   Potassium 07/04/2022 4.4  3.5 - 5.1 mmol/L Final   Chloride 07/04/2022 109  98 - 111 mmol/L Final   CO2 07/04/2022 26  22 - 32 mmol/L Final    Glucose, Bld 07/04/2022 96  70 - 99 mg/dL Final   Glucose reference range applies only to samples taken after fasting for at least 8 hours.   BUN 07/04/2022 15  6 - 20 mg/dL Final   Creatinine, Ser 07/04/2022 0.83  0.44 - 1.00 mg/dL Final   Calcium 24/40/1027 9.3  8.9 - 10.3 mg/dL Final   Total Protein 25/36/6440 7.2  6.5 - 8.1 g/dL Final   Albumin 34/74/2595 3.9  3.5 - 5.0 g/dL Final   AST 63/87/5643 23  15 - 41 U/L Final   ALT 07/04/2022 28  0 - 44 U/L Final   Alkaline Phosphatase 07/04/2022 77  38 - 126 U/L Final   Total Bilirubin 07/04/2022 0.4  0.3 - 1.2 mg/dL Final   GFR, Estimated 07/04/2022 >60  >60 mL/min Final   Comment: (NOTE) Calculated using the CKD-EPI Creatinine Equation (2021)    Anion gap 07/04/2022 7  5 - 15 Final   Performed at [facility], [address] [place], [place] [address]   Alcohol, Ethyl (B) 07/04/2022 <10  <10 mg/dL Final   Comment: (NOTE) Lowest detectable limit for serum alcohol is 10 mg/dL.  For medical purposes only. Performed at [facility], [address] [place], [place] [address]    WBC 07/04/2022 7.0  4.0 - 10.5 K/uL Final   RBC 07/04/2022 4.18  3.87 - 5.11 MIL/uL Final   Hemoglobin 07/04/2022 12.8  12.0 -  15.0 g/dL Final   HCT 16/07/9603 39.1  36.0 - 46.0 % Final   MCV 07/04/2022 93.5  80.0 - 100.0 fL Final   MCH 07/04/2022 30.6  26.0 - 34.0 pg Final   MCHC 07/04/2022 32.7  30.0 - 36.0 g/dL Final   RDW 54/06/8118 12.9  11.5 - 15.5 % Final   Platelets 07/04/2022 243  150 - 400 K/uL Final   nRBC 07/04/2022 0.0  0.0 - 0.2 % Final   Neutrophils Relative % 07/04/2022 43  % Final   Neutro Abs 07/04/2022 3.0  1.7 - 7.7 K/uL Final   Lymphocytes Relative 07/04/2022 50  % Final   Lymphs Abs 07/04/2022 3.5  0.7 - 4.0 K/uL Final   Monocytes Relative 07/04/2022 7  % Final   Monocytes Absolute 07/04/2022 0.5  0.1 - 1.0 K/uL Final   Eosinophils Relative 07/04/2022 0  % Final   Eosinophils Absolute 07/04/2022  0.0  0.0 - 0.5 K/uL Final   Basophils Relative 07/04/2022 0  % Final   Basophils Absolute 07/04/2022 0.0  0.0 - 0.1 K/uL Final   Immature Granulocytes 07/04/2022 0  % Final   Abs Immature Granulocytes 07/04/2022 0.01  0.00 - 0.07 K/uL Final   Performed at [facility], [address], [place], [place] [address]   I-stat hCG, quantitative 07/04/2022 <5.0  <5 mIU/mL Final   Comment 3 07/04/2022          Final   Comment:   GEST. AGE      CONC.  (mIU/mL)   <=1 WEEK        5 - 50     2 WEEKS       50 - 500     3 WEEKS       100 - 10,000     4 WEEKS     1,000 - 30,000        FEMALE AND NON-PREGNANT FEMALE:     LESS THAN 5 mIU/mL   Admission on 06/14/2022, Discharged on 06/14/2022  Component Date Value Ref Range Status   Color, UA 06/14/2022 yellow  yellow Final   Clarity, UA 06/14/2022 cloudy (A)  clear Final   Glucose, UA 06/14/2022 negative  negative mg/dL Final   Bilirubin, UA 95/62/1308 negative  negative Final   Ketones, POC UA 06/14/2022 negative  negative mg/dL Final   Spec Grav, UA 65/78/4696 1.025  1.010 - 1.025 Final   Blood, UA 06/14/2022 negative  negative Final   pH, UA 06/14/2022 7.5  5.0 - 8.0 Final   Protein Ur, POC 06/14/2022 =30 (A)  negative mg/dL Final   Urobilinogen, UA 06/14/2022 0.2  0.2 or 1.0 E.U./dL Final   Nitrite, UA 29/52/8413 Negative  Negative Final   Leukocytes, UA 06/14/2022 Negative  Negative Final   Preg Test, Ur 06/14/2022 Negative  Negative Final   Specimen Description 06/14/2022 URINE, CLEAN CATCH   Final   Special Requests 06/14/2022    Final                   Value:NONE Performed at [facility], [address], [place], [place] [address]    Culture 06/14/2022 MULTIPLE SPECIES PRESENT, SUGGEST RECOLLECTION (A)   Final   Report Status 06/14/2022 06/16/2022 FINAL   Final  Admission on 06/07/2022, Discharged on 06/10/2022  Component Date Value Ref Range Status   SARS Coronavirus 2 by RT PCR 06/07/2022 NEGATIVE  NEGATIVE  Final   Comment: (NOTE) SARS-CoV-2 target nucleic  acids are NOT DETECTED.  The SARS-CoV-2 RNA is generally detectable in upper respiratory specimens during the acute phase of infection. The lowest concentration of SARS-CoV-2 viral copies this assay can detect is 138 copies/mL. A negative result does not preclude SARS-Cov-2 infection and should not be used as the sole basis for treatment or other patient management decisions. A negative result may occur with  improper specimen collection/handling, submission of specimen other than nasopharyngeal swab, presence of viral mutation(s) within the areas targeted by this assay, and inadequate number of viral copies(<138 copies/mL). A negative result must be combined with clinical observations, patient history, and epidemiological information. The expected result is Negative.  Fact Sheet for Patients:  BloggerCourse.com  Fact Sheet for Healthcare Providers:  SeriousBroker.it  This test is no                          t yet approved or cleared by the [place] FDA and  has been authorized for detection and/or diagnosis of SARS-CoV-2 by FDA under an Emergency Use Authorization (EUA). This EUA will remain  in effect (meaning this test can be used) for the duration of the COVID-19 declaration under Section 564(b)(1) of the Act, 21 U.S.C.section 360bbb-3(b)(1), unless the authorization is terminated  or revoked sooner.       Influenza A by PCR 06/07/2022 NEGATIVE  NEGATIVE Final   Influenza B by PCR 06/07/2022 NEGATIVE  NEGATIVE Final   Comment: (NOTE) The Xpert Xpress SARS-CoV-2/FLU/RSV plus assay is intended as an aid in the diagnosis of influenza from Nasopharyngeal swab specimens and should not be used as a sole basis for treatment. Nasal washings and aspirates are unacceptable for Xpert Xpress SARS-CoV-2/FLU/RSV testing.  Fact Sheet for  Patients: BloggerCourse.com  Fact Sheet for Healthcare Providers: SeriousBroker.it  This test is not yet approved or cleared by the [place] FDA and has been authorized for detection and/or diagnosis of SARS-CoV-2 by FDA under an Emergency Use Authorization (EUA). This EUA will remain in effect (meaning this test can be used) for the duration of the COVID-19 declaration under Section 564(b)(1) of the Act, 21 U.S.C. section 360bbb-3(b)(1), unless the authorization is terminated or revoked.  Performed at [facility], [address], [place], [place] [address]    WBC 06/07/2022 5.7  4.0 - 10.5 K/uL Final   RBC 06/07/2022 4.39  3.87 - 5.11 MIL/uL Final   Hemoglobin 06/07/2022 13.2  12.0 - 15.0 g/dL Final   HCT 65/78/4696 40.4  36.0 - 46.0 % Final   MCV 06/07/2022 92.0  80.0 - 100.0 fL Final   MCH 06/07/2022 30.1  26.0 - 34.0 pg Final   MCHC 06/07/2022 32.7  30.0 - 36.0 g/dL Final   RDW 29/52/8413 12.4  11.5 - 15.5 % Final   Platelets 06/07/2022 308  150 - 400 K/uL Final   nRBC 06/07/2022 0.0  0.0 - 0.2 % Final   Neutrophils Relative % 06/07/2022 42  % Final   Neutro Abs 06/07/2022 2.4  1.7 - 7.7 K/uL Final   Lymphocytes Relative 06/07/2022 54  % Final   Lymphs Abs 06/07/2022 3.1  0.7 - 4.0 K/uL Final   Monocytes Relative 06/07/2022 4  % Final   Monocytes Absolute 06/07/2022 0.2  0.1 - 1.0 K/uL Final   Eosinophils Relative 06/07/2022 0  % Final   Eosinophils Absolute 06/07/2022 0.0  0.0 - 0.5 K/uL Final   Basophils Relative 06/07/2022 0  % Final  Basophils Absolute 06/07/2022 0.0  0.0 - 0.1 K/uL Final   Immature Granulocytes 06/07/2022 0  % Final   Abs Immature Granulocytes 06/07/2022 0.01  0.00 - 0.07 K/uL Final   Performed at [facility], [address] [place], [place] [address]   Sodium 06/07/2022 139  135 - 145 mmol/L Final   Potassium 06/07/2022 4.0  3.5 - 5.1 mmol/L Final   Chloride 06/07/2022 104  98 -  111 mmol/L Final   CO2 06/07/2022 28  22 - 32 mmol/L Final   Glucose, Bld 06/07/2022 104 (H)  70 - 99 mg/dL Final   Glucose reference range applies only to samples taken after fasting for at least 8 hours.   BUN 06/07/2022 8  6 - 20 mg/dL Final   Creatinine, Ser 06/07/2022 0.84  0.44 - 1.00 mg/dL Final   Calcium 78/29/5621 9.1  8.9 - 10.3 mg/dL Final   Total Protein 30/86/5784 6.9  6.5 - 8.1 g/dL Final   Albumin 69/62/9528 3.7  3.5 - 5.0 g/dL Final   AST 41/32/4401 19  15 - 41 U/L Final   ALT 06/07/2022 22  0 - 44 U/L Final   Alkaline Phosphatase 06/07/2022 54  38 - 126 U/L Final   Total Bilirubin 06/07/2022 0.4  0.3 - 1.2 mg/dL Final   GFR, Estimated 06/07/2022 >60  >60 mL/min Final   Comment: (NOTE) Calculated using the CKD-EPI Creatinine Equation (2021)    Anion gap 06/07/2022 7  5 - 15 Final   Performed at [facility], [address] [place], [place] [address]   Hgb A1c MFr Bld 06/07/2022 5.0  4.8 - 5.6 % Final   Comment: (NOTE) Pre diabetes:          5.7%-6.4%  Diabetes:              >6.4%  Glycemic control for   <7.0% adults with diabetes    Mean Plasma Glucose 06/07/2022 96.8  mg/dL Final   Performed at [facility], [address], [place], [place] [address]   TSH 06/07/2022 1.620  0.350 - 4.500 uIU/mL Final   Comment: Performed by a 3rd Generation assay with a functional sensitivity of <=0.01 uIU/mL. Performed at [facility], [address], [place], [place] [address]    RPR Ser Ql 06/07/2022 NON REACTIVE  NON REACTIVE Final   Performed at [facility], [address], [place], [place] [address]   Color, Urine 06/07/2022 YELLOW  YELLOW Final   APPearance 06/07/2022 HAZY (A)  CLEAR Final   Specific Gravity, Urine 06/07/2022 1.018  1.005 - 1.030 Final   pH 06/07/2022 7.0  5.0 - 8.0 Final   Glucose, UA 06/07/2022 NEGATIVE  NEGATIVE mg/dL Final   Hgb urine dipstick 06/07/2022 NEGATIVE  NEGATIVE Final   Bilirubin Urine 06/07/2022 NEGATIVE   NEGATIVE Final   Ketones, ur 06/07/2022 NEGATIVE  NEGATIVE mg/dL Final   Protein, ur 38/75/6433 NEGATIVE  NEGATIVE mg/dL Final   Nitrite 29/51/8841 NEGATIVE  NEGATIVE Final   Leukocytes,Ua 06/07/2022 NEGATIVE  NEGATIVE Final   Performed at [facility], [address] [place], [place] [address]   Cholesterol 06/07/2022 164  0 - 200 mg/dL Final   Triglycerides 01/60/1093 158 (H)  <150 mg/dL Final   HDL 23/55/7322 44  >40 mg/dL Final   Total CHOL/HDL Ratio 06/07/2022 3.7  RATIO Final   VLDL 06/07/2022 32  0 - 40 mg/dL Final   LDL Cholesterol 06/07/2022  88  0 - 99 mg/dL Final   Comment:        Total Cholesterol/HDL:CHD Risk Coronary Heart Disease Risk Table                     Men   Women  1/2 Average Risk   3.4   3.3  Average Risk       5.0   4.4  2 X Average Risk   9.6   7.1  3 X Average Risk  23.4   11.0        Use the calculated Patient Ratio above and the CHD Risk Table to determine the patient's CHD Risk.        ATP III CLASSIFICATION (LDL):  <100     mg/dL   Optimal  809-983  mg/dL   Near or Above                    Optimal  130-159  mg/dL   Borderline  382-505  mg/dL   High  >397     mg/dL   Very High Performed at [facility], [address] [place], [place] [address]    HIV Screen 4th Generation wRfx 06/07/2022 Non Reactive  Non Reactive Final   Performed at [facility], [address] [place], [place] [address]   SARSCOV2ONAVIRUS 2 AG 06/07/2022 NEGATIVE  NEGATIVE Final   Comment: (NOTE) SARS-CoV-2 antigen NOT DETECTED.   Negative results are presumptive.  Negative results do not preclude SARS-CoV-2 infection and should not be used as the sole basis for treatment or other patient management decisions, including infection  control decisions, particularly in the presence of clinical signs and  symptoms consistent with COVID-19, or in those who have been in contact with the virus.  Negative results must be combined with clinical observations,  patient history, and epidemiological information. The expected result is Negative.  Fact Sheet for Patients: https://www.jennings-kim.com/  Fact Sheet for Healthcare Providers: https://alexander-rogers.biz/  This test is not yet approved or cleared by the [place] FDA and  has been authorized for detection and/or diagnosis of SARS-CoV-2 by FDA under an Emergency Use Authorization (EUA).  This EUA will remain in effect (meaning this test can be used) for the duration of  the COV                          ID-19 declaration under Section 564(b)(1) of the Act, 21 U.S.C. section 360bbb-3(b)(1), unless the authorization is terminated or revoked sooner.     POC Amphetamine UR 06/07/2022 None Detected  NONE DETECTED (Cut Off Level 1000 ng/mL) Final   POC Secobarbital (BAR) 06/07/2022 None Detected  NONE DETECTED (Cut Off Level 300 ng/mL) Final   POC Buprenorphine (BUP) 06/07/2022 None Detected  NONE DETECTED (Cut Off Level 10 ng/mL) Final   POC Oxazepam (BZO) 06/07/2022 None Detected  NONE DETECTED (Cut Off Level 300 ng/mL) Final   POC Cocaine UR 06/07/2022 None Detected  NONE DETECTED (Cut Off Level 300 ng/mL) Final   POC Methamphetamine UR 06/07/2022 None Detected  NONE DETECTED (Cut Off Level 1000 ng/mL) Final   POC Morphine 06/07/2022 None Detected  NONE DETECTED (Cut Off Level 300 ng/mL) Final   POC Methadone UR 06/07/2022 None Detected  NONE DETECTED (Cut Off Level 300 ng/mL) Final   POC Oxycodone UR 06/07/2022 None Detected  NONE DETECTED (Cut Off Level 100 ng/mL) Final   POC  Marijuana UR 06/07/2022 None Detected  NONE DETECTED (Cut Off Level 50 ng/mL) Final   Preg Test, Ur 06/08/2022 NEGATIVE  NEGATIVE Final   Comment:        THE SENSITIVITY OF THIS METHODOLOGY IS >20 mIU/mL. Performed at [facility], [address] [place], [place] [address]    Preg Test, Ur 06/08/2022 NEGATIVE  NEGATIVE Final   Comment:        THE SENSITIVITY OF  THIS METHODOLOGY IS >24 mIU/mL     Blood Alcohol level:  Lab Results  Component Value Date   ETH <10 07/04/2022   ETH <10 02/07/2022    Metabolic Disorder Labs: Lab Results  Component Value Date   HGBA1C 5.0 06/07/2022   MPG 96.8 06/07/2022   MPG 96.8 02/07/2022   No results found for: "PROLACTIN" Lab Results  Component Value Date   CHOL 181 07/25/2022   TRIG 118 07/25/2022   HDL 45 07/25/2022   CHOLHDL 4.0 07/25/2022   VLDL 24 07/25/2022   LDLCALC 112 (H) 07/25/2022   LDLCALC 88 06/07/2022    Therapeutic Lab Levels: No results found for: "LITHIUM" No results found for: "VALPROATE" No results found for: "CBMZ"  Physical Findings   GAD-7    Flowsheet Row [facility]17/2021 [facility]  Total GAD-7 Score 17      PHQ2-9    Flowsheet Row ED from 06/07/2022 in [facility] Office Visit from 12/17/2021 in [facility]  PHQ-2 Total Score 1 2  PHQ-9 Total Score 2 8      Flowsheet Row ED from 07/25/2022 in [facility] ED from 07/04/2022 in [facility] from 06/14/2022 in [facility]   C-SSRS RISK CATEGORY High Risk No Risk No Risk        Musculoskeletal  Strength & Muscle Tone: within normal limits Gait & Station: normal Patient leans: N/A  Psychiatric Specialty Exam  Presentation  General Appearance:  Appropriate for Environment  Eye Contact: Fair  Speech: Clear and Coherent  Speech Volume: Normal  Handedness: Right   Mood and Affect  Mood: Euthymic  Affect: Congruent   Thought Process  Thought Processes: Coherent  Descriptions of Associations:Intact  Orientation:Full (Time, Place and Person)  Thought Content:Logical  Diagnosis of Schizophrenia or Schizoaffective disorder in past: No  Duration of Psychotic Symptoms: Less than six months    Hallucinations:Hallucinations: None  Ideas of Reference:None  Suicidal Thoughts:Suicidal Thoughts: No  Homicidal Thoughts:Homicidal Thoughts: No   Sensorium  Memory: Immediate Fair; Recent Fair; Remote Fair  Judgment: Intact  Insight: Present   Executive Functions  Concentration: Fair  Attention Span: Fair  Recall: [employer] of Knowledge: Fair  Language: Fair   Psychomotor Activity  Psychomotor Activity: Psychomotor Activity: Normal   Assets  Assets: Communication Skills; Desire for Improvement   Sleep  Sleep: Sleep: Fair Number of Hours of Sleep: 8   No data recorded  Physical Exam  Physical Exam ROS Blood pressure (!) 101/50, pulse (!) 103, temperature 97.6 F (36.4 C), temperature source Oral, resp. rate 20, SpO2 96 %. There is no height or weight on file to calculate BMI.  Treatment Plan Summary: Daily contact with patient to assess and evaluate symptoms and progress in treatment and Medication management  Continue with current treatment plan on 09/08/2022  Continue Trileptal 300 mg p.o. twice daily Continue Zoloft 150  mg p.o. daily Continue Seroquel 400 mg p.o. twice daily Continue trazodone 50 mg p.o. nightly  Hypothyroidism: Continue levothyroxine 100 mcg daily  Nicotine: Continue 14 mg transdermal patch  See chart for agitation protocols   CSW to continue working on discharge disposition  Oneta Rack, NP 09/08/2022 2:31 PM

## 2022-09-08 NOTE — ED Notes (Signed)
Patient alert and oriented x 4. Patient calm and cooperative. Patient currently watching television and was compliant with medications. Patient denies pain/discomfort, denies SI, HI and AVH. Will continue to monitor and update as needed.

## 2022-09-08 NOTE — ED Notes (Signed)
Patient is sleeping no problems indicated. Patient is being monitored for safety. 

## 2022-09-08 NOTE — ED Notes (Signed)
Patient came to this nurse with complaint of constipation. She has docusate which has been administered however she states she still has not been able to have a bowel movement.

## 2022-09-09 LAB — POCT PREGNANCY, URINE: Preg Test, Ur: NEGATIVE

## 2022-09-09 NOTE — ED Notes (Signed)
[name]&Ox4. Patient denies SI/Hi and AVH.  Patient denies any physical complaints when asked. No acute distress noted. Support and encouragement provided. Routine safety checks conducted according to facility protocol. Encouraged patient to notify staff if thoughts of harm toward self or others arise. Patient verbalize understanding and agreement. Will continue to monitor for safety.    

## 2022-09-09 NOTE — ED Notes (Signed)
Patient resting quietly in bed with eyes closed. Respirations equal and unlabored, skin warm and dry, NAD. Routine safety checks conducted according to facility protocol. Will continue to monitor for safety.  

## 2022-09-09 NOTE — Progress Notes (Addendum)
[name] Progress Note  1325 - [name] spoke with pt regarding what her needs were in the moment.  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  Pt requested a release of information be sent so the clinic can provide [name] with information to be included in her chart.    [name] reminded the pt that the release of information can only be signed by her legal guardian but that one will be sent via email.  [name] retrieved Nicole [name] phone number for the pt to contact her about the meeting on 11 September 2022 with [facility] regarding updates on the disposition.  Invites were sent to Dr. Lucianne Muss, Dr. Alfonse Flavors, Elmarie Mainland [facility]), Fuller Mandril, and Dennison Mascot.  So far, only Dr. Lucianne Muss and Dr. Alfonse Flavors have accepted the invite.  1336 - [name] attempted to contact [name], [phone].  A HIPAA appropriate voicemail was left requesting a call back.   Hansel Starling, MSW, [name] [facility] [phone] phone [phone] fax

## 2022-09-09 NOTE — ED Notes (Signed)
Patient in bed eyes closed appears asleep. Rise and fall of chest noted. NAD noted. Will continue to monitor and update as needed.

## 2022-09-10 DIAGNOSIS — R4183 Borderline intellectual functioning: Secondary | ICD-10-CM

## 2022-09-10 MED ORDER — CALCIUM CARBONATE ANTACID 500 MG PO CHEW
1.0000 | CHEWABLE_TABLET | Freq: Once | ORAL | Status: AC
  Administered 2022-09-10: 200 mg via ORAL
  Filled 2022-09-10: qty 1

## 2022-09-10 NOTE — ED Provider Notes (Signed)
Behavioral Health Progress Note  Date and Time: 09/09/2022 8:16 AM Name: Nicole [name] MRN:  [id number]  Subjective: Nicole [name] is awaiting placement via social work disposition.  She is awake, alert and oriented x 3.  Denying suicidal or homicidal ideations.  Denies auditory visual hallucinations.  She is advised that no update from [place] is available just yet.  Patient has been medication compliant while on the unit.  No overt behaviors noted.  Reports a good appetite.  States she is resting well throughout the night.  Support and encouragement, reassurance was provided.    Per charted history; " The transition of care team is working to establish placement. 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  Per admission assessment note: " [name] is a 31 y.o. female, with PMH of SCZ unspecified, MDD with psychosis, who presented voluntary to [facility] (07/25/2022) via [facility] after a verbal altercation with Nicole [name] (not patient's legal guardian) for transient SI. This is patient's fourth visit to [facility] for similar concerns this year.Patient has been dismissed from previous group home due to threatening SI and HI, then leaving the group home.   Legal Guardian: Nicole [name]) transitioning to be Nicole [name]) Point of contact: Nicole [name])"   Diagnosis:  Final diagnoses:  Severe episode of recurrent major depressive disorder, with psychotic features (HCC)    Total Time spent with patient: 15 minutes  Past Psychiatric History:  Past Medical History:  Past Medical History:  Diagnosis Date   Anxiety    Depression    Hypothyroidism 08/07/2022   Tobacco use disorder 08/07/2022    Past Surgical History:  Procedure Laterality Date   WISDOM TOOTH EXTRACTION Bilateral 2020   Family History:  Family  History  Problem Relation Age of Onset   Hypertension Father    Diabetes Father    Family Psychiatric  History:  Social History:  Social History   Substance and Sexual Activity  Alcohol Use Never     Social History   Substance and Sexual Activity  Drug Use Never    Social History   Socioeconomic History   Marital status: Single    Spouse name: Not on file   Number of children: Not on file   Years of education: Not on file   Highest education level: Not on file  Occupational History   Not on file  Tobacco Use   Smoking status: Every Day    Types: Cigarettes   Smokeless tobacco: Never  Vaping Use   Vaping Use: Some days  Substance and Sexual Activity   Alcohol use: Never   Drug use: Never   Sexual activity: Yes    Partners: Female    Birth control/protection: Implant  Other Topics Concern   Not on file  Social History Narrative   Not on file   Social Determinants of Health   Financial Resource Strain: Not on file  Food Insecurity: Not on file  Transportation Needs: Not on file  Physical Activity: Not on file  Stress: Not on file  Social Connections: Not on file   SDOH:  SDOH Screenings   Depression (PHQ2-9): Low Risk  (06/10/2022)  Tobacco Use: High Risk (09/01/2022)   Additional Social History:    Pain Medications: SEE MAR Prescriptions: SEE MAR Over the Counter: SEE MAR History of alcohol / drug use?:  Yes Longest period of sobriety (when/how long): N/A Negative Consequences of Use:  (NONE) Withdrawal Symptoms: None Name of Substance 1: ETOH IN PAST--PT CURRENTLY DENIES 1 - Frequency: SOCIAL USE ETOH IN PAST 1 - Method of Aquiring: LEGAL 1- Route of Use: ORAL DRINK Name of Substance 2: NICOTINE-VAPE AND CIGARETTES 2 - Amount (size/oz): LESS THAN ONE PACK 2 - Frequency: DAILY 2 - Method of Aquiring: LEGAL 2 - Route of Substance Use: ORAL SMOKE                Sleep: Good  Appetite:  Good  Current Medications:  Current  Facility-Administered Medications  Medication Dose Route Frequency Provider Last Rate Last Admin   acetaminophen (TYLENOL) tablet 650 mg  650 mg Oral Q6H PRN Onuoha, Chinwendu V, NP   650 mg at 09/05/22 2219   alum & mag hydroxide-simeth (MAALOX/MYLANTA) 200-200-20 MG/5ML suspension 30 mL  30 mL Oral Q4H PRN Onuoha, Chinwendu V, NP   30 mL at 07/27/22 2151   ARIPiprazole ER (ABILIFY MAINTENA) injection 400 mg  400 mg Intramuscular Q28 days Princess Bruins, DO   400 mg at 08/30/22 1338   docusate sodium (COLACE) capsule 100 mg  100 mg Oral Daily [name], MD   100 mg at 09/09/22 0923   hydrOXYzine (ATARAX) tablet 25 mg  25 mg Oral TID PRN Onuoha, Chinwendu V, NP   25 mg at 09/09/22 2136   ibuprofen (ADVIL) tablet 400 mg  400 mg Oral Q6H PRN Onuoha, Chinwendu V, NP   400 mg at 09/06/22 1314   levothyroxine (SYNTHROID) tablet 100 mcg  100 mcg Oral Q0600 Princess Bruins, DO   100 mcg at 09/10/22 0644   ziprasidone (GEODON) injection 20 mg  20 mg Intramuscular Q12H PRN Onuoha, Chinwendu V, NP       And   LORazepam (ATIVAN) tablet 1 mg  1 mg Oral PRN Onuoha, Chinwendu V, NP       nicotine (NICODERM CQ - dosed in mg/24 hours) patch 14 mg  14 mg Transdermal Q0600 Onuoha, Chinwendu V, NP   14 mg at 09/09/22 0925   ondansetron (ZOFRAN-ODT) disintegrating tablet 4 mg  4 mg Oral Q8H PRN Sindy Guadeloupe, NP   4 mg at 08/30/22 2122   Oxcarbazepine (TRILEPTAL) tablet 300 mg  300 mg Oral BID Onuoha, Chinwendu V, NP   300 mg at 09/09/22 2137   pantoprazole (PROTONIX) EC tablet 20 mg  20 mg Oral Daily Princess Bruins, DO   20 mg at 09/09/22 1610   QUEtiapine (SEROQUEL) tablet 400 mg  400 mg Oral BID Onuoha, Chinwendu V, NP   400 mg at 09/09/22 2136   sertraline (ZOLOFT) tablet 150 mg  150 mg Oral Daily Onuoha, Chinwendu V, NP   150 mg at 09/09/22 0923   traZODone (DESYREL) tablet 50 mg  50 mg Oral QHS PRN Onuoha, Chinwendu V, NP   50 mg at 09/09/22 2136   valACYclovir (VALTREX) tablet 500 mg  500 mg Oral Daily  Princess Bruins, DO   500 mg at 09/09/22 9604   Current Outpatient Medications  Medication Sig Dispense Refill   ABILIFY MAINTENA 400 MG PRSY prefilled syringe 400 mg every 28 (twenty-eight) days.     cetirizine (ZYRTEC) 10 MG tablet Take 10 mg by mouth daily.     cyclobenzaprine (FLEXERIL) 10 MG tablet Take 1 tablet (10 mg total) by mouth 2 (two) times daily as needed for muscle spasms. 20 tablet 0   fluticasone (FLONASE) 50 MCG/ACT  nasal spray Place 1 spray into both nostrils daily.     meloxicam (MOBIC) 15 MG tablet Take 15 mg by mouth daily.     Oxcarbazepine (TRILEPTAL) 300 MG tablet Take 1 tablet (300 mg total) by mouth 2 (two) times daily. 60 tablet 0   QUEtiapine (SEROQUEL) 400 MG tablet Take 1 tablet (400 mg total) by mouth 2 (two) times daily. 60 tablet 0   sertraline (ZOLOFT) 50 MG tablet Take 3 tablets (150 mg total) by mouth in the morning. 90 tablet 0   traZODone (DESYREL) 100 MG tablet Take 1 tablet (100 mg total) by mouth at bedtime. 30 tablet 0   valACYclovir (VALTREX) 500 MG tablet Take 500 mg by mouth daily.      Labs  Lab Results:  Admission on 07/25/2022  Component Date Value Ref Range Status   SARS Coronavirus 2 by RT PCR 07/25/2022 NEGATIVE  NEGATIVE Final   Comment: (NOTE) SARS-CoV-2 target nucleic acids are NOT DETECTED.  The SARS-CoV-2 RNA is generally detectable in upper respiratory specimens during the acute phase of infection. The lowest concentration of SARS-CoV-2 viral copies this assay can detect is 138 copies/mL. A negative result does not preclude SARS-Cov-2 infection and should not be used as the sole basis for treatment or other patient management decisions. A negative result may occur with  improper specimen collection/handling, submission of specimen other than nasopharyngeal swab, presence of viral mutation(s) within the areas targeted by this assay, and inadequate number of viral copies(<138 copies/mL). A negative result must be combined  with clinical observations, patient history, and epidemiological information. The expected result is Negative.  Fact Sheet for Patients:  BloggerCourse.com  Fact Sheet for Healthcare Providers:  SeriousBroker.it  This test is no                          t yet approved or cleared by the [place] FDA and  has been authorized for detection and/or diagnosis of SARS-CoV-2 by FDA under an Emergency Use Authorization (EUA). This EUA will remain  in effect (meaning this test can be used) for the duration of the COVID-19 declaration under Section 564(b)(1) of the Act, 21 U.S.C.section 360bbb-3(b)(1), unless the authorization is terminated  or revoked sooner.       Influenza A by PCR 07/25/2022 NEGATIVE  NEGATIVE Final   Influenza B by PCR 07/25/2022 NEGATIVE  NEGATIVE Final   Comment: (NOTE) The Xpert Xpress SARS-CoV-2/FLU/RSV plus assay is intended as an aid in the diagnosis of influenza from Nasopharyngeal swab specimens and should not be used as a sole basis for treatment. Nasal washings and aspirates are unacceptable for Xpert Xpress SARS-CoV-2/FLU/RSV testing.  Fact Sheet for Patients: BloggerCourse.com  Fact Sheet for Healthcare Providers: SeriousBroker.it  This test is not yet approved or cleared by the [place] FDA and has been authorized for detection and/or diagnosis of SARS-CoV-2 by FDA under an Emergency Use Authorization (EUA). This EUA will remain in effect (meaning this test can be used) for the duration of the COVID-19 declaration under Section 564(b)(1) of the Act, 21 U.S.C. section 360bbb-3(b)(1), unless the authorization is terminated or revoked.  Performed at [facility], [address], [place], [place] [address]    WBC 07/25/2022 8.3  4.0 - 10.5 K/uL Final   RBC 07/25/2022 4.44  3.87 - 5.11 MIL/uL Final   Hemoglobin 07/25/2022 13.7  12.0 -  15.0 g/dL Final   HCT 62/83/6629 40.2  36.0 -  46.0 % Final   MCV 07/25/2022 90.5  80.0 - 100.0 fL Final   MCH 07/25/2022 30.9  26.0 - 34.0 pg Final   MCHC 07/25/2022 34.1  30.0 - 36.0 g/dL Final   RDW 95/62/1308 12.2  11.5 - 15.5 % Final   Platelets 07/25/2022 248  150 - 400 K/uL Final   nRBC 07/25/2022 0.0  0.0 - 0.2 % Final   Neutrophils Relative % 07/25/2022 43  % Final   Neutro Abs 07/25/2022 3.6  1.7 - 7.7 K/uL Final   Lymphocytes Relative 07/25/2022 52  % Final   Lymphs Abs 07/25/2022 4.2 (H)  0.7 - 4.0 K/uL Final   Monocytes Relative 07/25/2022 5  % Final   Monocytes Absolute 07/25/2022 0.4  0.1 - 1.0 K/uL Final   Eosinophils Relative 07/25/2022 0  % Final   Eosinophils Absolute 07/25/2022 0.0  0.0 - 0.5 K/uL Final   Basophils Relative 07/25/2022 0  % Final   Basophils Absolute 07/25/2022 0.0  0.0 - 0.1 K/uL Final   Immature Granulocytes 07/25/2022 0  % Final   Abs Immature Granulocytes 07/25/2022 0.02  0.00 - 0.07 K/uL Final   Performed at [facility], [address] [place], [place] [address]   Sodium 07/25/2022 138  135 - 145 mmol/L Final   Potassium 07/25/2022 4.0  3.5 - 5.1 mmol/L Final   Chloride 07/25/2022 104  98 - 111 mmol/L Final   CO2 07/25/2022 29  22 - 32 mmol/L Final   Glucose, Bld 07/25/2022 83  70 - 99 mg/dL Final   Glucose reference range applies only to samples taken after fasting for at least 8 hours.   BUN 07/25/2022 11  6 - 20 mg/dL Final   Creatinine, Ser 07/25/2022 0.97  0.44 - 1.00 mg/dL Final   Calcium 69/62/9528 9.2  8.9 - 10.3 mg/dL Final   Total Protein 41/32/4401 7.0  6.5 - 8.1 g/dL Final   Albumin 02/72/5366 3.8  3.5 - 5.0 g/dL Final   AST 44/12/4740 18  15 - 41 U/L Final   ALT 07/25/2022 22  0 - 44 U/L Final   Alkaline Phosphatase 07/25/2022 64  38 - 126 U/L Final   Total Bilirubin 07/25/2022 0.2 (L)  0.3 - 1.2 mg/dL Final   GFR, Estimated 07/25/2022 >60  >60 mL/min Final   Comment: (NOTE) Calculated using the CKD-EPI Creatinine  Equation (2021)    Anion gap 07/25/2022 5  5 - 15 Final   Performed at [facility], [address], [place], [place] [address]   POC Amphetamine UR 07/25/2022 None Detected  NONE DETECTED (Cut Off Level 1000 ng/mL) Preliminary   POC Secobarbital (BAR) 07/25/2022 None Detected  NONE DETECTED (Cut Off Level 300 ng/mL) Preliminary   POC Buprenorphine (BUP) 07/25/2022 None Detected  NONE DETECTED (Cut Off Level 10 ng/mL) Preliminary   POC Oxazepam (BZO) 07/25/2022 None Detected  NONE DETECTED (Cut Off Level 300 ng/mL) Preliminary   POC Cocaine UR 07/25/2022 None Detected  NONE DETECTED (Cut Off Level 300 ng/mL) Preliminary   POC Methamphetamine UR 07/25/2022 None Detected  NONE DETECTED (Cut Off Level 1000 ng/mL) Preliminary   POC Morphine 07/25/2022 None Detected  NONE DETECTED (Cut Off Level 300 ng/mL) Preliminary   POC Methadone UR 07/25/2022 None Detected  NONE DETECTED (Cut Off Level 300 ng/mL) Preliminary   POC Oxycodone UR 07/25/2022 None Detected  NONE DETECTED (Cut Off Level 100 ng/mL) Preliminary   POC Marijuana UR 07/25/2022 None Detected  NONE DETECTED (Cut Off Level 50 ng/mL) Preliminary   SARSCOV2ONAVIRUS 2 AG 07/25/2022 NEGATIVE  NEGATIVE Final   Comment: (NOTE) SARS-CoV-2 antigen NOT DETECTED.   Negative results are presumptive.  Negative results do not preclude SARS-CoV-2 infection and should not be used as the sole basis for treatment or other patient management decisions, including infection  control decisions, particularly in the presence of clinical signs and  symptoms consistent with COVID-19, or in those who have been in contact with the virus.  Negative results must be combined with clinical observations, patient history, and epidemiological information. The expected result is Negative.  Fact Sheet for Patients: https://www.jennings-kim.com/  Fact Sheet for Healthcare Providers: https://alexander-rogers.biz/  This test is not yet  approved or cleared by the [place] FDA and  has been authorized for detection and/or diagnosis of SARS-CoV-2 by FDA under an Emergency Use Authorization (EUA).  This EUA will remain in effect (meaning this test can be used) for the duration of  the COV                          ID-19 declaration under Section 564(b)(1) of the Act, 21 U.S.C. section 360bbb-3(b)(1), unless the authorization is terminated or revoked sooner.     Cholesterol 07/25/2022 181  0 - 200 mg/dL Final   Triglycerides 91/47/8295 118  <150 mg/dL Final   HDL 62/13/0865 45  >40 mg/dL Final   Total CHOL/HDL Ratio 07/25/2022 4.0  RATIO Final   VLDL 07/25/2022 24  0 - 40 mg/dL Final   LDL Cholesterol 07/25/2022 112 (H)  0 - 99 mg/dL Final   Comment:        Total Cholesterol/HDL:CHD Risk Coronary Heart Disease Risk Table                     Men   Women  1/2 Average Risk   3.4   3.3  Average Risk       5.0   4.4  2 X Average Risk   9.6   7.1  3 X Average Risk  23.4   11.0        Use the calculated Patient Ratio above and the CHD Risk Table to determine the patient's CHD Risk.        ATP III CLASSIFICATION (LDL):  <100     mg/dL   Optimal  784-696  mg/dL   Near or Above                    Optimal  130-159  mg/dL   Borderline  295-284  mg/dL   High  >132     mg/dL   Very High Performed at [facility], [address] [place], [place] [address]    TSH 07/25/2022 6.668 (H)  0.350 - 4.500 uIU/mL Final   Comment: Performed by a 3rd Generation assay with a functional sensitivity of <=0.01 uIU/mL. Performed at [facility], [address], [place], [place] [address]    Glucose-Capillary 07/26/2022 104 (H)  70 - 99 mg/dL Final   Glucose reference range applies only to samples taken after fasting for at least 8 hours.   T3, Free 07/31/2022 2.3  2.0 - 4.4 pg/mL Final   Comment: (NOTE) Performed At: [facility] [address] [place], [place] [id number] Jolene Schimke MD QV:[id number]     Free T4 07/31/2022 0.60 (L)  0.61 - 1.12 ng/dL Final   Comment: (  NOTE) Biotin ingestion may interfere with free T4 tests. If the results are inconsistent with the TSH level, previous test results, or the clinical presentation, then consider biotin interference. If needed, order repeat testing after stopping biotin. Performed at [facility], [address] [place], [place] [address]    Glucose-Capillary 08/29/2022 100 (H)  70 - 99 mg/dL Final   Glucose reference range applies only to samples taken after fasting for at least 8 hours.   TSH 09/05/2022 0.793  0.350 - 4.500 uIU/mL Final   Comment: Performed by a 3rd Generation assay with a functional sensitivity of <=0.01 uIU/mL. Performed at [facility], [address] [place], [place] [address]    Free T4 09/05/2022 0.73  0.61 - 1.12 ng/dL Final   Comment: (NOTE) Biotin ingestion may interfere with free T4 tests. If the results are inconsistent with the TSH level, previous test results, or the clinical presentation, then consider biotin interference. If needed, order repeat testing after stopping biotin. Performed at [facility], [address] [place], [place] [address]    Preg Test, Ur 09/06/2022 Negative  Negative Final   Preg Test, Ur 09/06/2022 NEGATIVE  NEGATIVE Final   Comment:        THE SENSITIVITY OF THIS METHODOLOGY IS >24 mIU/mL    Preg Test, Ur 09/05/2022 NEGATIVE  NEGATIVE Final   Comment:        THE SENSITIVITY OF THIS METHODOLOGY IS >24 mIU/mL   Admission on 07/04/2022, Discharged on 07/05/2022  Component Date Value Ref Range Status   Sodium 07/04/2022 142  135 - 145 mmol/L Final   Potassium 07/04/2022 4.4  3.5 - 5.1 mmol/L Final   Chloride 07/04/2022 109  98 - 111 mmol/L Final   CO2 07/04/2022 26  22 - 32 mmol/L Final   Glucose, Bld 07/04/2022 96  70 - 99 mg/dL Final   Glucose reference range applies only to samples taken after fasting for at least 8 hours.   BUN 07/04/2022 15  6 - 20  mg/dL Final   Creatinine, Ser 07/04/2022 0.83  0.44 - 1.00 mg/dL Final   Calcium 91/47/8295 9.3  8.9 - 10.3 mg/dL Final   Total Protein 62/13/0865 7.2  6.5 - 8.1 g/dL Final   Albumin 78/46/9629 3.9  3.5 - 5.0 g/dL Final   AST 52/84/1324 23  15 - 41 U/L Final   ALT 07/04/2022 28  0 - 44 U/L Final   Alkaline Phosphatase 07/04/2022 77  38 - 126 U/L Final   Total Bilirubin 07/04/2022 0.4  0.3 - 1.2 mg/dL Final   GFR, Estimated 07/04/2022 >60  >60 mL/min Final   Comment: (NOTE) Calculated using the CKD-EPI Creatinine Equation (2021)    Anion gap 07/04/2022 7  5 - 15 Final   Performed at [facility], [address], [place], [place] [address]   Alcohol, Ethyl (B) 07/04/2022 <10  <10 mg/dL Final   Comment: (NOTE) Lowest detectable limit for serum alcohol is 10 mg/dL.  For medical purposes only. Performed at [facility], [address] [place], [place] [address]    WBC 07/04/2022 7.0  4.0 - 10.5 K/uL Final   RBC 07/04/2022 4.18  3.87 - 5.11 MIL/uL Final   Hemoglobin 07/04/2022 12.8  12.0 - 15.0 g/dL Final   HCT 64/40/3474 39.1  36.0 - 46.0 % Final   MCV 07/04/2022 93.5  80.0 - 100.0 fL Final   MCH 07/04/2022 30.6  26.0 -  34.0 pg Final   MCHC 07/04/2022 32.7  30.0 - 36.0 g/dL Final   RDW 16/10/960409/21/2023 12.9  11.5 - 15.5 % Final   Platelets 07/04/2022 243  150 - 400 K/uL Final   nRBC 07/04/2022 0.0  0.0 - 0.2 % Final   Neutrophils Relative % 07/04/2022 43  % Final   Neutro Abs 07/04/2022 3.0  1.7 - 7.7 K/uL Final   Lymphocytes Relative 07/04/2022 50  % Final   Lymphs Abs 07/04/2022 3.5  0.7 - 4.0 K/uL Final   Monocytes Relative 07/04/2022 7  % Final   Monocytes Absolute 07/04/2022 0.5  0.1 - 1.0 K/uL Final   Eosinophils Relative 07/04/2022 0  % Final   Eosinophils Absolute 07/04/2022 0.0  0.0 - 0.5 K/uL Final   Basophils Relative 07/04/2022 0  % Final   Basophils Absolute 07/04/2022 0.0  0.0 - 0.1 K/uL Final   Immature Granulocytes 07/04/2022 0  %  Final   Abs Immature Granulocytes 07/04/2022 0.01  0.00 - 0.07 K/uL Final   Performed at [facility], [address] [place], [place] [address]   I-stat hCG, quantitative 07/04/2022 <5.0  <5 mIU/mL Final   Comment 3 07/04/2022          Final   Comment:   GEST. AGE      CONC.  (mIU/mL)   <=1 WEEK        5 - 50     2 WEEKS       50 - 500     3 WEEKS       100 - 10,000     4 WEEKS     1,000 - 30,000        FEMALE AND NON-PREGNANT FEMALE:     LESS THAN 5 mIU/mL   Admission on 06/14/2022, Discharged on 06/14/2022  Component Date Value Ref Range Status   Color, UA 06/14/2022 yellow  yellow Final   Clarity, UA 06/14/2022 cloudy (A)  clear Final   Glucose, UA 06/14/2022 negative  negative mg/dL Final   Bilirubin, UA 11/91/478209/10/2021 negative  negative Final   Ketones, POC UA 06/14/2022 negative  negative mg/dL Final   Spec Grav, UA 95/62/130809/10/2021 1.025  1.010 - 1.025 Final   Blood, UA 06/14/2022 negative  negative Final   pH, UA 06/14/2022 7.5  5.0 - 8.0 Final   Protein Ur, POC 06/14/2022 =30 (A)  negative mg/dL Final   Urobilinogen, UA 06/14/2022 0.2  0.2 or 1.0 E.U./dL Final   Nitrite, UA 65/78/469609/10/2021 Negative  Negative Final   Leukocytes, UA 06/14/2022 Negative  Negative Final   Preg Test, Ur 06/14/2022 Negative  Negative Final   Specimen Description 06/14/2022 URINE, CLEAN CATCH   Final   Special Requests 06/14/2022    Final                   Value:NONE Performed at [facility], [address] [place], [place] [address]    Culture 06/14/2022 MULTIPLE SPECIES PRESENT, SUGGEST RECOLLECTION (A)   Final   Report Status 06/14/2022 06/16/2022 FINAL   Final  Admission on 06/07/2022, Discharged on 06/10/2022  Component Date Value Ref Range Status   SARS Coronavirus 2 by RT PCR 06/07/2022 NEGATIVE  NEGATIVE Final   Comment: (NOTE) SARS-CoV-2 target nucleic acids are NOT DETECTED.  The SARS-CoV-2 RNA is generally detectable in upper respiratory specimens during the acute  phase of infection. The lowest concentration of SARS-CoV-2 viral copies this assay can detect is  138 copies/mL. A negative result does not preclude SARS-Cov-2 infection and should not be used as the sole basis for treatment or other patient management decisions. A negative result may occur with  improper specimen collection/handling, submission of specimen other than nasopharyngeal swab, presence of viral mutation(s) within the areas targeted by this assay, and inadequate number of viral copies(<138 copies/mL). A negative result must be combined with clinical observations, patient history, and epidemiological information. The expected result is Negative.  Fact Sheet for Patients:  BloggerCourse.com  Fact Sheet for Healthcare Providers:  SeriousBroker.it  This test is no                          t yet approved or cleared by the [place] FDA and  has been authorized for detection and/or diagnosis of SARS-CoV-2 by FDA under an Emergency Use Authorization (EUA). This EUA will remain  in effect (meaning this test can be used) for the duration of the COVID-19 declaration under Section 564(b)(1) of the Act, 21 U.S.C.section 360bbb-3(b)(1), unless the authorization is terminated  or revoked sooner.       Influenza A by PCR 06/07/2022 NEGATIVE  NEGATIVE Final   Influenza B by PCR 06/07/2022 NEGATIVE  NEGATIVE Final   Comment: (NOTE) The Xpert Xpress SARS-CoV-2/FLU/RSV plus assay is intended as an aid in the diagnosis of influenza from Nasopharyngeal swab specimens and should not be used as a sole basis for treatment. Nasal washings and aspirates are unacceptable for Xpert Xpress SARS-CoV-2/FLU/RSV testing.  Fact Sheet for Patients: BloggerCourse.com  Fact Sheet for Healthcare Providers: SeriousBroker.it  This test is not yet approved or cleared by the [place] FDA and has  been authorized for detection and/or diagnosis of SARS-CoV-2 by FDA under an Emergency Use Authorization (EUA). This EUA will remain in effect (meaning this test can be used) for the duration of the COVID-19 declaration under Section 564(b)(1) of the Act, 21 U.S.C. section 360bbb-3(b)(1), unless the authorization is terminated or revoked.  Performed at [facility], [address], [place], [place] [address]    WBC 06/07/2022 5.7  4.0 - 10.5 K/uL Final   RBC 06/07/2022 4.39  3.87 - 5.11 MIL/uL Final   Hemoglobin 06/07/2022 13.2  12.0 - 15.0 g/dL Final   HCT 60/45/4098 40.4  36.0 - 46.0 % Final   MCV 06/07/2022 92.0  80.0 - 100.0 fL Final   MCH 06/07/2022 30.1  26.0 - 34.0 pg Final   MCHC 06/07/2022 32.7  30.0 - 36.0 g/dL Final   RDW 11/91/4782 12.4  11.5 - 15.5 % Final   Platelets 06/07/2022 308  150 - 400 K/uL Final   nRBC 06/07/2022 0.0  0.0 - 0.2 % Final   Neutrophils Relative % 06/07/2022 42  % Final   Neutro Abs 06/07/2022 2.4  1.7 - 7.7 K/uL Final   Lymphocytes Relative 06/07/2022 54  % Final   Lymphs Abs 06/07/2022 3.1  0.7 - 4.0 K/uL Final   Monocytes Relative 06/07/2022 4  % Final   Monocytes Absolute 06/07/2022 0.2  0.1 - 1.0 K/uL Final   Eosinophils Relative 06/07/2022 0  % Final   Eosinophils Absolute 06/07/2022 0.0  0.0 - 0.5 K/uL Final   Basophils Relative 06/07/2022 0  % Final   Basophils Absolute 06/07/2022 0.0  0.0 - 0.1 K/uL Final   Immature Granulocytes 06/07/2022 0  % Final   Abs Immature Granulocytes 06/07/2022 0.01  0.00 - 0.07 K/uL Final  Performed at [facility], [address], [place], [place] [address]   Sodium 06/07/2022 139  135 - 145 mmol/L Final   Potassium 06/07/2022 4.0  3.5 - 5.1 mmol/L Final   Chloride 06/07/2022 104  98 - 111 mmol/L Final   CO2 06/07/2022 28  22 - 32 mmol/L Final   Glucose, Bld 06/07/2022 104 (H)  70 - 99 mg/dL Final   Glucose reference range applies only to samples taken after fasting for at least 8 hours.    BUN 06/07/2022 8  6 - 20 mg/dL Final   Creatinine, Ser 06/07/2022 0.84  0.44 - 1.00 mg/dL Final   Calcium 60/45/4098 9.1  8.9 - 10.3 mg/dL Final   Total Protein 11/91/4782 6.9  6.5 - 8.1 g/dL Final   Albumin 95/62/1308 3.7  3.5 - 5.0 g/dL Final   AST 65/78/4696 19  15 - 41 U/L Final   ALT 06/07/2022 22  0 - 44 U/L Final   Alkaline Phosphatase 06/07/2022 54  38 - 126 U/L Final   Total Bilirubin 06/07/2022 0.4  0.3 - 1.2 mg/dL Final   GFR, Estimated 06/07/2022 >60  >60 mL/min Final   Comment: (NOTE) Calculated using the CKD-EPI Creatinine Equation (2021)    Anion gap 06/07/2022 7  5 - 15 Final   Performed at [facility], [address], [place], [place] [address]   Hgb A1c MFr Bld 06/07/2022 5.0  4.8 - 5.6 % Final   Comment: (NOTE) Pre diabetes:          5.7%-6.4%  Diabetes:              >6.4%  Glycemic control for   <7.0% adults with diabetes    Mean Plasma Glucose 06/07/2022 96.8  mg/dL Final   Performed at [facility], [address], [place], [place] [address]   TSH 06/07/2022 1.620  0.350 - 4.500 uIU/mL Final   Comment: Performed by a 3rd Generation assay with a functional sensitivity of <=0.01 uIU/mL. Performed at [facility], [address] [place], [place] [address]    RPR Ser Ql 06/07/2022 NON REACTIVE  NON REACTIVE Final   Performed at [facility], [address] [place], [place] [address]   Color, Urine 06/07/2022 YELLOW  YELLOW Final   APPearance 06/07/2022 HAZY (A)  CLEAR Final   Specific Gravity, Urine 06/07/2022 1.018  1.005 - 1.030 Final   pH 06/07/2022 7.0  5.0 - 8.0 Final   Glucose, UA 06/07/2022 NEGATIVE  NEGATIVE mg/dL Final   Hgb urine dipstick 06/07/2022 NEGATIVE  NEGATIVE Final   Bilirubin Urine 06/07/2022 NEGATIVE  NEGATIVE Final   Ketones, ur 06/07/2022 NEGATIVE  NEGATIVE mg/dL Final   Protein, ur 64/40/3474 NEGATIVE  NEGATIVE mg/dL Final   Nitrite 25/95/6387 NEGATIVE  NEGATIVE Final   Leukocytes,Ua 06/07/2022 NEGATIVE   NEGATIVE Final   Performed at [facility], [address], [place], [place] [address]   Cholesterol 06/07/2022 164  0 - 200 mg/dL Final   Triglycerides 29/51/8841 158 (H)  <150 mg/dL Final   HDL 66/03/3015 44  >40 mg/dL Final   Total CHOL/HDL Ratio 06/07/2022 3.7  RATIO Final   VLDL 06/07/2022 32  0 - 40 mg/dL Final   LDL Cholesterol 06/07/2022 88  0 - 99 mg/dL Final   Comment:        Total Cholesterol/HDL:CHD Risk Coronary Heart Disease Risk Table  Men   Women  1/2 Average Risk   3.4   3.3  Average Risk       5.0   4.4  2 X Average Risk   9.6   7.1  3 X Average Risk  23.4   11.0        Use the calculated Patient Ratio above and the CHD Risk Table to determine the patient's CHD Risk.        ATP III CLASSIFICATION (LDL):  <100     mg/dL   Optimal  161-096  mg/dL   Near or Above                    Optimal  130-159  mg/dL   Borderline  045-409  mg/dL   High  >811     mg/dL   Very High Performed at [facility], [address] [place], [place] [address]    HIV Screen 4th Generation wRfx 06/07/2022 Non Reactive  Non Reactive Final   Performed at [facility], [address], [place], [place] [address]   SARSCOV2ONAVIRUS 2 AG 06/07/2022 NEGATIVE  NEGATIVE Final   Comment: (NOTE) SARS-CoV-2 antigen NOT DETECTED.   Negative results are presumptive.  Negative results do not preclude SARS-CoV-2 infection and should not be used as the sole basis for treatment or other patient management decisions, including infection  control decisions, particularly in the presence of clinical signs and  symptoms consistent with COVID-19, or in those who have been in contact with the virus.  Negative results must be combined with clinical observations, patient history, and epidemiological information. The expected result is Negative.  Fact Sheet for Patients: https://www.jennings-kim.com/  Fact Sheet for Healthcare  Providers: https://alexander-rogers.biz/  This test is not yet approved or cleared by the [place] FDA and  has been authorized for detection and/or diagnosis of SARS-CoV-2 by FDA under an Emergency Use Authorization (EUA).  This EUA will remain in effect (meaning this test can be used) for the duration of  the COV                          ID-19 declaration under Section 564(b)(1) of the Act, 21 U.S.C. section 360bbb-3(b)(1), unless the authorization is terminated or revoked sooner.     POC Amphetamine UR 06/07/2022 None Detected  NONE DETECTED (Cut Off Level 1000 ng/mL) Final   POC Secobarbital (BAR) 06/07/2022 None Detected  NONE DETECTED (Cut Off Level 300 ng/mL) Final   POC Buprenorphine (BUP) 06/07/2022 None Detected  NONE DETECTED (Cut Off Level 10 ng/mL) Final   POC Oxazepam (BZO) 06/07/2022 None Detected  NONE DETECTED (Cut Off Level 300 ng/mL) Final   POC Cocaine UR 06/07/2022 None Detected  NONE DETECTED (Cut Off Level 300 ng/mL) Final   POC Methamphetamine UR 06/07/2022 None Detected  NONE DETECTED (Cut Off Level 1000 ng/mL) Final   POC Morphine 06/07/2022 None Detected  NONE DETECTED (Cut Off Level 300 ng/mL) Final   POC Methadone UR 06/07/2022 None Detected  NONE DETECTED (Cut Off Level 300 ng/mL) Final   POC Oxycodone UR 06/07/2022 None Detected  NONE DETECTED (Cut Off Level 100 ng/mL) Final   POC Marijuana UR 06/07/2022 None Detected  NONE DETECTED (Cut Off Level 50 ng/mL) Final   Preg Test, Ur 06/08/2022 NEGATIVE  NEGATIVE Final   Comment:        THE SENSITIVITY OF THIS METHODOLOGY IS >20 mIU/mL. Performed at [facility], [address], [place], [place] [address]    Preg Test, Ur 06/08/2022 NEGATIVE  NEGATIVE Final   Comment:        THE SENSITIVITY OF THIS METHODOLOGY IS >24 mIU/mL     Blood Alcohol level:  Lab Results  Component Value Date   ETH <10 07/04/2022   ETH <10 02/07/2022    Metabolic Disorder Labs: Lab Results  Component  Value Date   HGBA1C 5.0 06/07/2022   MPG 96.8 06/07/2022   MPG 96.8 02/07/2022   No results found for: "PROLACTIN" Lab Results  Component Value Date   CHOL 181 07/25/2022   TRIG 118 07/25/2022   HDL 45 07/25/2022   CHOLHDL 4.0 07/25/2022   VLDL 24 07/25/2022   LDLCALC 112 (H) 07/25/2022   LDLCALC 88 06/07/2022    Therapeutic Lab Levels: No results found for: "LITHIUM" No results found for: "VALPROATE" No results found for: "CBMZ"  Physical Findings   GAD-7    Flowsheet Row [facility]  Total GAD-7 Score 17      PHQ2-9    Flowsheet Row ED from 06/07/2022 in [facility] Office Visit from 12/17/2021 in [facility]  PHQ-2 Total Score 1 2  PHQ-9 Total Score 2 8      Flowsheet Row ED from 07/25/2022 in [facility] ED from 07/04/2022 in [facility] from 06/14/2022 in [facility]   C-SSRS RISK CATEGORY High Risk No Risk No Risk        Musculoskeletal  Strength & Muscle Tone: within normal limits Gait & Station: normal Patient leans: N/A  Psychiatric Specialty Exam  Presentation  General Appearance:  Appropriate for Environment; Casual  Eye Contact: Good  Speech: Clear and Coherent  Speech Volume: Normal  Handedness: Right   Mood and Affect  Mood: Euthymic  Affect: Congruent   Thought Process  Thought Processes: Coherent  Descriptions of Associations:Intact  Orientation:Full (Time, Place and Person)  Thought Content:Logical  Diagnosis of Schizophrenia or Schizoaffective disorder in past: No  Duration of Psychotic Symptoms: Less than six months   Hallucinations:Hallucinations: None   Ideas of Reference:None  Suicidal Thoughts:Suicidal Thoughts: No   Homicidal Thoughts:Homicidal Thoughts: No    Sensorium  Memory: Immediate  Fair; Recent Fair  Judgment: Intact  Insight: Present   Executive Functions  Concentration: Good  Attention Span: Good  Recall: [employer] of Knowledge: Fair  Language: Fair   Psychomotor Activity  Psychomotor Activity: Psychomotor Activity: Normal    Assets  Assets: Communication Skills; Desire for Improvement   Sleep  Sleep: Sleep: Good    Physical Exam  Physical Exam ROS Blood pressure (!) 100/58, pulse 100, temperature (!) 97.5 F (36.4 C), temperature source Oral, resp. rate 16, SpO2 100 %. There is no height or weight on file to calculate BMI.  Treatment Plan Summary: Daily contact with patient to assess and evaluate symptoms and progress in treatment and Medication management  Continue with current treatment plan on 09/08/2022  Continue Trileptal 300 mg p.o. twice daily Continue Zoloft 150 mg p.o. daily Continue Seroquel 400 mg p.o. twice daily Continue trazodone 50 mg p.o. nightly  Hypothyroidism: Continue levothyroxine 100 mcg daily  Nicotine: Continue 14 mg transdermal patch  See chart for agitation protocols   CSW to continue working on discharge disposition  [name], MD .09/09/2022 8:16  AM

## 2022-09-10 NOTE — ED Notes (Signed)
Patient resting quietly in bed with eyes closed, Respirations equal and unlabored, skin warm and dry, NAD. Routine safety checks conducted according to facility protocol. Will continue to monitor for safety. 

## 2022-09-10 NOTE — Progress Notes (Addendum)
[name] Progress Note   1033 - Contacted [facility], [phone], and spoke with Jasmine December, the group home coordinator, regarding availability for adult female.  [name] was instructed to provide a [place]-FL2 along with the pt's demographics to be reviewed at their treatment team meeting tomorrow morning.  [name] reviewed pt's chart for a current [place]-FL2 as well as email Elmarie Mainland, [place] care coordinator to check if she had one that was recent.    Dr. Alfonse Flavors spoke with [name] regarding the pt's request to have information sent from [facility]. [name] informed her that a ROI would need to be sent to her legal guardian for a signature. [name] had left the task to the [name] on night crew to complete.  1145 - Attempted to contact Renato Shin, [phone]. Left another voicemail requesting a call back.  1448 - Contacted pt's father, [name] @ [phone], to confirm that he has been receiving the emails with the blank ROI forms for his wife to sign.  A voicemail was left requesting a call back.  1608 - Collaborated with [name] Carmela Hurt and Staci Acosta to complete [place] FL2 Screening form through the [employer] system.  Form needs to have a cosigner; Staci Acosta stated she would seek guidance on how to add a cosigner to the form.    Hansel Starling, MSW, [name] [facility] [phone] phone [phone] fax

## 2022-09-10 NOTE — ED Provider Notes (Signed)
Behavioral Health Progress Note  Date and Time: 09/09/2022 12:15 PM Name: Nicole [name] MRN:  [id number]  Subjective: Nicole [name] is awaiting placement via social work disposition.  She is awake, alert and oriented x 3.  Denying suicidal or homicidal ideations.  Denies auditory visual hallucinations.  She is informed of a scheduled meeting with [facility] tomorrow for possible placement. Patient has been medication compliant while on the unit.  No overt behaviors noted.  Reports a good appetite.  States she is resting well throughout the night.  Support and encouragement, reassurance was provided.   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 Department. Pending release of information from patient's legal guardian.     She reports intermittent episodes of heart palpitations and right-sided chest discomfort that radiates to her back over the last few days. Episodes are triggered by stressful thoughts on her situation or when she feels agitated. They also occur when she is pacing around the unit.  Episodes occur for a few minutes at a time before resolving on its own or with rest. Episodes have not occurred at rest or with food intake. She denies nausea, vomiting, diaphoresis, or shortness of breath. She reports a history of similar transient palpitations and chest discomfort in the past.      Per charted history; " The transition of care team is working to establish placement. 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  Per admission assessment note: " Nicole [name] is a 31 y.o. female, with PMH of SCZ unspecified, MDD with psychosis, who presented voluntary to [facility] (07/25/2022) via [facility] after a verbal altercation with [name] (not patient's legal guardian) for transient  SI. This is patient's fourth visit to [facility] for similar concerns this year.Patient has been dismissed from previous group home due to threatening SI and HI, then leaving the group home.   Legal Guardian: [name]) transitioning to be [name]) Point of contact: [name])"   Diagnosis:  Final diagnoses:  Severe episode of recurrent major depressive disorder, with psychotic features ([place])    Total Time spent with patient: 15 minutes  Past Psychiatric History:  Past Medical History:  Past Medical History:  Diagnosis Date   Anxiety    Depression    Hypothyroidism 08/07/2022   Tobacco use disorder 08/07/2022    Past Surgical History:  Procedure Laterality Date   WISDOM TOOTH EXTRACTION Bilateral 2020   Family History:  Family History  Problem Relation Age of Onset   Hypertension Father    Diabetes Father    Family Psychiatric  History:  Social History:  Social History   Substance and Sexual Activity  Alcohol Use Never     Social History   Substance and Sexual Activity  Drug Use Never    Social History   Socioeconomic History   Marital status: Single    Spouse name: Not on file   Number of children: Not on file   Years of education: Not on file   Highest education level: Not on file  Occupational History   Not on file  Tobacco Use   Smoking status: Every Day    Types: Cigarettes   Smokeless tobacco: Never  Vaping Use   Vaping Use: Some days  Substance and Sexual Activity   Alcohol use: Never  Drug use: Never   Sexual activity: Yes    Partners: Female    Birth control/protection: Implant  Other Topics Concern   Not on file  Social History Narrative   Not on file   Social Determinants of Health   Financial Resource Strain: Not on file  Food Insecurity: Not on file  Transportation Needs: Not on file  Physical Activity: Not on file  Stress: Not on file  Social Connections: Not on file   SDOH:  SDOH Screenings    Depression (PHQ2-9): Low Risk  (06/10/2022)  Tobacco Use: High Risk (09/01/2022)   Additional Social History:    Pain Medications: SEE MAR Prescriptions: SEE MAR Over the Counter: SEE MAR History of alcohol / drug use?: Yes Longest period of sobriety (when/how long): N/A Negative Consequences of Use:  (NONE) Withdrawal Symptoms: None Name of Substance 1: ETOH IN PAST--PT CURRENTLY DENIES 1 - Frequency: SOCIAL USE ETOH IN PAST 1 - Method of Aquiring: LEGAL 1- Route of Use: ORAL DRINK Name of Substance 2: NICOTINE-VAPE AND CIGARETTES 2 - Amount (size/oz): LESS THAN ONE PACK 2 - Frequency: DAILY 2 - Method of Aquiring: LEGAL 2 - Route of Substance Use: ORAL SMOKE                Sleep: Good  Appetite:  Good  Current Medications:  Current Facility-Administered Medications  Medication Dose Route Frequency Provider Last Rate Last Admin   acetaminophen (TYLENOL) tablet 650 mg  650 mg Oral Q6H PRN Onuoha, Chinwendu V, NP   650 mg at 09/05/22 2219   alum & mag hydroxide-simeth (MAALOX/MYLANTA) 200-200-20 MG/5ML suspension 30 mL  30 mL Oral Q4H PRN Onuoha, Chinwendu V, NP   30 mL at 07/27/22 2151   ARIPiprazole ER (ABILIFY MAINTENA) injection 400 mg  400 mg Intramuscular Q28 days Princess Bruins, DO   400 mg at 08/30/22 1338   docusate sodium (COLACE) capsule 100 mg  100 mg Oral Daily [name], MD   100 mg at 09/10/22 0931   hydrOXYzine (ATARAX) tablet 25 mg  25 mg Oral TID PRN Onuoha, Chinwendu V, NP   25 mg at 09/10/22 1200   ibuprofen (ADVIL) tablet 400 mg  400 mg Oral Q6H PRN Onuoha, Chinwendu V, NP   400 mg at 09/06/22 1314   levothyroxine (SYNTHROID) tablet 100 mcg  100 mcg Oral Q0600 Princess Bruins, DO   100 mcg at 09/10/22 0644   ziprasidone (GEODON) injection 20 mg  20 mg Intramuscular Q12H PRN Onuoha, Chinwendu V, NP       And   LORazepam (ATIVAN) tablet 1 mg  1 mg Oral PRN Onuoha, Chinwendu V, NP       nicotine (NICODERM CQ - dosed in mg/24 hours) patch 14 mg  14  mg Transdermal Q0600 Onuoha, Chinwendu V, NP   14 mg at 09/10/22 0931   ondansetron (ZOFRAN-ODT) disintegrating tablet 4 mg  4 mg Oral Q8H PRN Sindy Guadeloupe, NP   4 mg at 08/30/22 2122   Oxcarbazepine (TRILEPTAL) tablet 300 mg  300 mg Oral BID Onuoha, Chinwendu V, NP   300 mg at 09/10/22 0931   pantoprazole (PROTONIX) EC tablet 20 mg  20 mg Oral Daily Princess Bruins, DO   20 mg at 09/10/22 0931   QUEtiapine (SEROQUEL) tablet 400 mg  400 mg Oral BID Onuoha, Chinwendu V, NP   400 mg at 09/10/22 0931   sertraline (ZOLOFT) tablet 150 mg  150 mg Oral Daily Onuoha, Chinwendu V, NP  150 mg at 09/10/22 0932   traZODone (DESYREL) tablet 50 mg  50 mg Oral QHS PRN Onuoha, Chinwendu V, NP   50 mg at 09/09/22 2136   valACYclovir (VALTREX) tablet 500 mg  500 mg Oral Daily Merrily Brittle, DO   500 mg at 09/10/22 0932   Current Outpatient Medications  Medication Sig Dispense Refill   ABILIFY MAINTENA 400 MG PRSY prefilled syringe 400 mg every 28 (twenty-eight) days.     cetirizine (ZYRTEC) 10 MG tablet Take 10 mg by mouth daily.     cyclobenzaprine (FLEXERIL) 10 MG tablet Take 1 tablet (10 mg total) by mouth 2 (two) times daily as needed for muscle spasms. 20 tablet 0   fluticasone (FLONASE) 50 MCG/ACT nasal spray Place 1 spray into both nostrils daily.     meloxicam (MOBIC) 15 MG tablet Take 15 mg by mouth daily.     Oxcarbazepine (TRILEPTAL) 300 MG tablet Take 1 tablet (300 mg total) by mouth 2 (two) times daily. 60 tablet 0   QUEtiapine (SEROQUEL) 400 MG tablet Take 1 tablet (400 mg total) by mouth 2 (two) times daily. 60 tablet 0   sertraline (ZOLOFT) 50 MG tablet Take 3 tablets (150 mg total) by mouth in the morning. 90 tablet 0   traZODone (DESYREL) 100 MG tablet Take 1 tablet (100 mg total) by mouth at bedtime. 30 tablet 0   valACYclovir (VALTREX) 500 MG tablet Take 500 mg by mouth daily.      Labs  Lab Results:  Admission on 07/25/2022  Component Date Value Ref Range Status   SARS Coronavirus 2 by  RT PCR 07/25/2022 NEGATIVE  NEGATIVE Final   Comment: (NOTE) SARS-CoV-2 target nucleic acids are NOT DETECTED.  The SARS-CoV-2 RNA is generally detectable in upper respiratory specimens during the acute phase of infection. The lowest concentration of SARS-CoV-2 viral copies this assay can detect is 138 copies/mL. A negative result does not preclude SARS-Cov-2 infection and should not be used as the sole basis for treatment or other patient management decisions. A negative result may occur with  improper specimen collection/handling, submission of specimen other than nasopharyngeal swab, presence of viral mutation(s) within the areas targeted by this assay, and inadequate number of viral copies(<138 copies/mL). A negative result must be combined with clinical observations, patient history, and epidemiological information. The expected result is Negative.  Fact Sheet for Patients:  EntrepreneurPulse.com.au  Fact Sheet for Healthcare Providers:  IncredibleEmployment.be  This test is no                          t yet approved or cleared by the [place] FDA and  has been authorized for detection and/or diagnosis of SARS-CoV-2 by FDA under an Emergency Use Authorization (EUA). This EUA will remain  in effect (meaning this test can be used) for the duration of the COVID-19 declaration under Section 564(b)(1) of the Act, 21 U.S.C.section 360bbb-3(b)(1), unless the authorization is terminated  or revoked sooner.       Influenza A by PCR 07/25/2022 NEGATIVE  NEGATIVE Final   Influenza B by PCR 07/25/2022 NEGATIVE  NEGATIVE Final   Comment: (NOTE) The Xpert Xpress SARS-CoV-2/FLU/RSV plus assay is intended as an aid in the diagnosis of influenza from Nasopharyngeal swab specimens and should not be used as a sole basis for treatment. Nasal washings and aspirates are unacceptable for Xpert Xpress SARS-CoV-2/FLU/RSV testing.  Fact Sheet for  Patients: EntrepreneurPulse.com.au  Fact Sheet for Healthcare  Providers: IncredibleEmployment.be  This test is not yet approved or cleared by the [place] and has been authorized for detection and/or diagnosis of SARS-CoV-2 by FDA under an Emergency Use Authorization (EUA). This EUA will remain in effect (meaning this test can be used) for the duration of the COVID-19 declaration under Section 564(b)(1) of the Act, 21 U.S.C. section 360bbb-3(b)(1), unless the authorization is terminated or revoked.  Performed at [facility], [place] [address], [place], [place] [address]    WBC 07/25/2022 8.3  4.0 - 10.5 K/uL Final   RBC 07/25/2022 4.44  3.87 - 5.11 MIL/uL Final   Hemoglobin 07/25/2022 13.7  12.0 - 15.0 g/dL Final   HCT 07/25/2022 40.2  36.0 - 46.0 % Final   MCV 07/25/2022 90.5  80.0 - 100.0 fL Final   MCH 07/25/2022 30.9  26.0 - 34.0 pg Final   MCHC 07/25/2022 34.1  30.0 - 36.0 g/dL Final   RDW 07/25/2022 12.2  11.5 - 15.5 % Final   Platelets 07/25/2022 248  150 - 400 K/uL Final   nRBC 07/25/2022 0.0  0.0 - 0.2 % Final   Neutrophils Relative % 07/25/2022 43  % Final   Neutro Abs 07/25/2022 3.6  1.7 - 7.7 K/uL Final   Lymphocytes Relative 07/25/2022 52  % Final   Lymphs Abs 07/25/2022 4.2 (H)  0.7 - 4.0 K/uL Final   Monocytes Relative 07/25/2022 5  % Final   Monocytes Absolute 07/25/2022 0.4  0.1 - 1.0 K/uL Final   Eosinophils Relative 07/25/2022 0  % Final   Eosinophils Absolute 07/25/2022 0.0  0.0 - 0.5 K/uL Final   Basophils Relative 07/25/2022 0  % Final   Basophils Absolute 07/25/2022 0.0  0.0 - 0.1 K/uL Final   Immature Granulocytes 07/25/2022 0  % Final   Abs Immature Granulocytes 07/25/2022 0.02  0.00 - 0.07 K/uL Final   Performed at [facility], [place] [address], [place], [place] [address]   Sodium 07/25/2022 138  135 - 145 mmol/L Final   Potassium 07/25/2022 4.0  3.5 - 5.1 mmol/L Final   Chloride 07/25/2022 104   98 - 111 mmol/L Final   CO2 07/25/2022 29  22 - 32 mmol/L Final   Glucose, Bld 07/25/2022 83  70 - 99 mg/dL Final   Glucose reference range applies only to samples taken after fasting for at least 8 hours.   BUN 07/25/2022 11  6 - 20 mg/dL Final   Creatinine, Ser 07/25/2022 0.97  0.44 - 1.00 mg/dL Final   Calcium 07/25/2022 9.2  8.9 - 10.3 mg/dL Final   Total Protein 07/25/2022 7.0  6.5 - 8.1 g/dL Final   Albumin 07/25/2022 3.8  3.5 - 5.0 g/dL Final   AST 07/25/2022 18  15 - 41 U/L Final   ALT 07/25/2022 22  0 - 44 U/L Final   Alkaline Phosphatase 07/25/2022 64  38 - 126 U/L Final   Total Bilirubin 07/25/2022 0.2 (L)  0.3 - 1.2 mg/dL Final   GFR, Estimated 07/25/2022 >60  >60 mL/min Final   Comment: (NOTE) Calculated using the CKD-EPI Creatinine Equation (2021)    Anion gap 07/25/2022 5  5 - 15 Final   Performed at [place] [address], [place], [place] [address]   POC Amphetamine UR 07/25/2022 None Detected  NONE DETECTED (Cut Off Level 1000 ng/mL) Preliminary   POC Secobarbital (BAR) 07/25/2022 None Detected  NONE DETECTED (Cut Off Level 300 ng/mL) Preliminary  POC Buprenorphine (BUP) 07/25/2022 None Detected  NONE DETECTED (Cut Off Level 10 ng/mL) Preliminary   POC Oxazepam (BZO) 07/25/2022 None Detected  NONE DETECTED (Cut Off Level 300 ng/mL) Preliminary   POC Cocaine UR 07/25/2022 None Detected  NONE DETECTED (Cut Off Level 300 ng/mL) Preliminary   POC Methamphetamine UR 07/25/2022 None Detected  NONE DETECTED (Cut Off Level 1000 ng/mL) Preliminary   POC Morphine 07/25/2022 None Detected  NONE DETECTED (Cut Off Level 300 ng/mL) Preliminary   POC Methadone UR 07/25/2022 None Detected  NONE DETECTED (Cut Off Level 300 ng/mL) Preliminary   POC Oxycodone UR 07/25/2022 None Detected  NONE DETECTED (Cut Off Level 100 ng/mL) Preliminary   POC Marijuana UR 07/25/2022 None Detected  NONE DETECTED (Cut Off Level 50 ng/mL) Preliminary   SARSCOV2ONAVIRUS 2 AG 07/25/2022 NEGATIVE   NEGATIVE Final   Comment: (NOTE) SARS-CoV-2 antigen NOT DETECTED.   Negative results are presumptive.  Negative results do not preclude SARS-CoV-2 infection and should not be used as the sole basis for treatment or other patient management decisions, including infection  control decisions, particularly in the presence of clinical signs and  symptoms consistent with COVID-19, or in those who have been in contact with the virus.  Negative results must be combined with clinical observations, patient history, and epidemiological information. The expected result is Negative.  Fact Sheet for Patients: HandmadeRecipes.com.cy  Fact Sheet for Healthcare Providers: FuneralLife.at  This test is not yet approved or cleared by the [place] FDA and  has been authorized for detection and/or diagnosis of SARS-CoV-2 by FDA under an Emergency Use Authorization (EUA).  This EUA will remain in effect (meaning this test can be used) for the duration of  the COV                          ID-19 declaration under Section 564(b)(1) of the Act, 21 U.S.C. section 360bbb-3(b)(1), unless the authorization is terminated or revoked sooner.     Cholesterol 07/25/2022 181  0 - 200 mg/dL Final   Triglycerides 07/25/2022 118  <150 mg/dL Final   HDL 07/25/2022 45  >40 mg/dL Final   Total CHOL/HDL Ratio 07/25/2022 4.0  RATIO Final   VLDL 07/25/2022 24  0 - 40 mg/dL Final   LDL Cholesterol 07/25/2022 112 (H)  0 - 99 mg/dL Final   Comment:        Total Cholesterol/HDL:CHD Risk Coronary Heart Disease Risk Table                     Men   Women  1/2 Average Risk   3.4   3.3  Average Risk       5.0   4.4  2 X Average Risk   9.6   7.1  3 X Average Risk  23.4   11.0        Use the calculated Patient Ratio above and the CHD Risk Table to determine the patient's CHD Risk.        ATP III CLASSIFICATION (LDL):  <100     mg/dL   Optimal  100-129  mg/dL   Near or  Above                    Optimal  130-159  mg/dL   Borderline  160-189  mg/dL   High  >190     mg/dL   Very High Performed at [facility]  [address] [place], [place] [address]    TSH 07/25/2022 6.668 (H)  0.350 - 4.500 uIU/mL Final   Comment: Performed by a 3rd Generation assay with a functional sensitivity of <=0.01 uIU/mL. Performed at [facility], [place] [address] [place], [place] [address]    Glucose-Capillary 07/26/2022 104 (H)  70 - 99 mg/dL Final   Glucose reference range applies only to samples taken after fasting for at least 8 hours.   T3, Free 07/31/2022 2.3  2.0 - 4.4 pg/mL Final   Comment: (NOTE) Performed At: [facility] [place], [place] [id number] Rush Farmer MD [id number]    Free T4 07/31/2022 0.60 (L)  0.61 - 1.12 ng/dL Final   Comment: (NOTE) Biotin ingestion may interfere with free T4 tests. If the results are inconsistent with the TSH level, previous test results, or the clinical presentation, then consider biotin interference. If needed, order repeat testing after stopping biotin. Performed at [facility], [place] [address], [place], [place] [address]    Glucose-Capillary 08/29/2022 100 (H)  70 - 99 mg/dL Final   Glucose reference range applies only to samples taken after fasting for at least 8 hours.   TSH 09/05/2022 0.793  0.350 - 4.500 uIU/mL Final   Comment: Performed by a 3rd Generation assay with a functional sensitivity of <=0.01 uIU/mL. Performed at [facility], [place] [address] [place], [place] [address]    Free T4 09/05/2022 0.73  0.61 - 1.12 ng/dL Final   Comment: (NOTE) Biotin ingestion may interfere with free T4 tests. If the results are inconsistent with the TSH level, previous test results, or the clinical presentation, then consider biotin interference. If needed, order repeat testing after stopping biotin. Performed at [facility], [place] [address], [place],   [address]    Preg Test, Ur 09/06/2022 Negative  Negative Final   Preg Test, Ur 09/06/2022 NEGATIVE  NEGATIVE Final   Comment:        THE SENSITIVITY OF THIS METHODOLOGY IS >24 mIU/mL    Preg Test, Ur 09/05/2022 NEGATIVE  NEGATIVE Final   Comment:        THE SENSITIVITY OF THIS METHODOLOGY IS >24 mIU/mL   Admission on 07/04/2022, Discharged on 07/05/2022  Component Date Value Ref Range Status   Sodium 07/04/2022 142  135 - 145 mmol/L Final   Potassium 07/04/2022 4.4  3.5 - 5.1 mmol/L Final   Chloride 07/04/2022 109  98 - 111 mmol/L Final   CO2 07/04/2022 26  22 - 32 mmol/L Final   Glucose, Bld 07/04/2022 96  70 - 99 mg/dL Final   Glucose reference range applies only to samples taken after fasting for at least 8 hours.   BUN 07/04/2022 15  6 - 20 mg/dL Final   Creatinine, Ser 07/04/2022 0.83  0.44 - 1.00 mg/dL Final   Calcium 07/04/2022 9.3  8.9 - 10.3 mg/dL Final   Total Protein 07/04/2022 7.2  6.5 - 8.1 g/dL Final   Albumin 07/04/2022 3.9  3.5 - 5.0 g/dL Final   AST 07/04/2022 23  15 - 41 U/L Final   ALT 07/04/2022 28  0 - 44 U/L Final   Alkaline Phosphatase 07/04/2022 77  38 - 126 U/L Final   Total Bilirubin 07/04/2022 0.4  0.3 - 1.2 mg/dL Final   GFR, Estimated 07/04/2022 >60  >60 mL/min Final   Comment: (NOTE) Calculated using the CKD-EPI Creatinine Equation (2021)    Anion gap 07/04/2022 7  5 - 15 Final   Performed at [facility], [place] [address] [place], [place] [address]   Alcohol, Ethyl (B) 07/04/2022 <10  <10 mg/dL Final   Comment: (NOTE) Lowest detectable limit for serum alcohol is 10 mg/dL.  For medical purposes only. Performed at [facility], [place] [address] [place], [place] [address]    WBC 07/04/2022 7.0  4.0 - 10.5 K/uL Final   RBC 07/04/2022 4.18  3.87 - 5.11 MIL/uL Final   Hemoglobin 07/04/2022 12.8  12.0 - 15.0 g/dL Final   HCT 07/04/2022 39.1  36.0 - 46.0 % Final   MCV 07/04/2022 93.5  80.0 - 100.0 fL Final    MCH 07/04/2022 30.6  26.0 - 34.0 pg Final   MCHC 07/04/2022 32.7  30.0 - 36.0 g/dL Final   RDW 07/04/2022 12.9  11.5 - 15.5 % Final   Platelets 07/04/2022 243  150 - 400 K/uL Final   nRBC 07/04/2022 0.0  0.0 - 0.2 % Final   Neutrophils Relative % 07/04/2022 43  % Final   Neutro Abs 07/04/2022 3.0  1.7 - 7.7 K/uL Final   Lymphocytes Relative 07/04/2022 50  % Final   Lymphs Abs 07/04/2022 3.5  0.7 - 4.0 K/uL Final   Monocytes Relative 07/04/2022 7  % Final   Monocytes Absolute 07/04/2022 0.5  0.1 - 1.0 K/uL Final   Eosinophils Relative 07/04/2022 0  % Final   Eosinophils Absolute 07/04/2022 0.0  0.0 - 0.5 K/uL Final   Basophils Relative 07/04/2022 0  % Final   Basophils Absolute 07/04/2022 0.0  0.0 - 0.1 K/uL Final   Immature Granulocytes 07/04/2022 0  % Final   Abs Immature Granulocytes 07/04/2022 0.01  0.00 - 0.07 K/uL Final   Performed at [facility], [place] [address], [place], [place] [address]   I-stat hCG, quantitative 07/04/2022 <5.0  <5 mIU/mL Final   Comment 3 07/04/2022          Final   Comment:   GEST. AGE      CONC.  (mIU/mL)   <=1 WEEK        5 - 50     2 WEEKS       50 - 500     3 WEEKS       100 - 10,000     4 WEEKS     1,000 - 30,000        FEMALE AND NON-PREGNANT FEMALE:     LESS THAN 5 mIU/mL   Admission on 06/14/2022, Discharged on 06/14/2022  Component Date Value Ref Range Status   Color, UA 06/14/2022 yellow  yellow Final   Clarity, UA 06/14/2022 cloudy (A)  clear Final   Glucose, UA 06/14/2022 negative  negative mg/dL Final   Bilirubin, UA 06/14/2022 negative  negative Final   Ketones, POC UA 06/14/2022 negative  negative mg/dL Final   Spec Grav, UA 06/14/2022 1.025  1.010 - 1.025 Final   Blood, UA 06/14/2022 negative  negative Final   pH, UA 06/14/2022 7.5  5.0 - 8.0 Final   Protein Ur, POC 06/14/2022 =30 (A)  negative mg/dL Final   Urobilinogen, UA 06/14/2022 0.2  0.2 or 1.0 E.U./dL Final   Nitrite, UA 06/14/2022 Negative  Negative Final    Leukocytes, UA 06/14/2022 Negative  Negative Final   Preg Test, Ur 06/14/2022 Negative  Negative Final   Specimen Description 06/14/2022 URINE, CLEAN CATCH   Final   Special Requests 06/14/2022  Final                   Value:NONE Performed at [facility], [place] [address], [place], [place] [address]    Culture 06/14/2022 MULTIPLE SPECIES PRESENT, SUGGEST RECOLLECTION (A)   Final   Report Status 06/14/2022 06/16/2022 FINAL   Final  Admission on 06/07/2022, Discharged on 06/10/2022  Component Date Value Ref Range Status   SARS Coronavirus 2 by RT PCR 06/07/2022 NEGATIVE  NEGATIVE Final   Comment: (NOTE) SARS-CoV-2 target nucleic acids are NOT DETECTED.  The SARS-CoV-2 RNA is generally detectable in upper respiratory specimens during the acute phase of infection. The lowest concentration of SARS-CoV-2 viral copies this assay can detect is 138 copies/mL. A negative result does not preclude SARS-Cov-2 infection and should not be used as the sole basis for treatment or other patient management decisions. A negative result may occur with  improper specimen collection/handling, submission of specimen other than nasopharyngeal swab, presence of viral mutation(s) within the areas targeted by this assay, and inadequate number of viral copies(<138 copies/mL). A negative result must be combined with clinical observations, patient history, and epidemiological information. The expected result is Negative.  Fact Sheet for Patients:  EntrepreneurPulse.com.au  Fact Sheet for Healthcare Providers:  IncredibleEmployment.be  This test is no                          t yet approved or cleared by the [place] FDA and  has been authorized for detection and/or diagnosis of SARS-CoV-2 by FDA under an Emergency Use Authorization (EUA). This EUA will remain  in effect (meaning this test can be used) for the duration of the COVID-19 declaration under Section  564(b)(1) of the Act, 21 U.S.C.section 360bbb-3(b)(1), unless the authorization is terminated  or revoked sooner.       Influenza A by PCR 06/07/2022 NEGATIVE  NEGATIVE Final   Influenza B by PCR 06/07/2022 NEGATIVE  NEGATIVE Final   Comment: (NOTE) The Xpert Xpress SARS-CoV-2/FLU/RSV plus assay is intended as an aid in the diagnosis of influenza from Nasopharyngeal swab specimens and should not be used as a sole basis for treatment. Nasal washings and aspirates are unacceptable for Xpert Xpress SARS-CoV-2/FLU/RSV testing.  Fact Sheet for Patients: EntrepreneurPulse.com.au  Fact Sheet for Healthcare Providers: IncredibleEmployment.be  This test is not yet approved or cleared by the [place] FDA and has been authorized for detection and/or diagnosis of SARS-CoV-2 by FDA under an Emergency Use Authorization (EUA). This EUA will remain in effect (meaning this test can be used) for the duration of the COVID-19 declaration under Section 564(b)(1) of the Act, 21 U.S.C. section 360bbb-3(b)(1), unless the authorization is terminated or revoked.  Performed at [facility], [place] [address] [place], [place] [address]    WBC 06/07/2022 5.7  4.0 - 10.5 K/uL Final   RBC 06/07/2022 4.39  3.87 - 5.11 MIL/uL Final   Hemoglobin 06/07/2022 13.2  12.0 - 15.0 g/dL Final   HCT 06/07/2022 40.4  36.0 - 46.0 % Final   MCV 06/07/2022 92.0  80.0 - 100.0 fL Final   MCH 06/07/2022 30.1  26.0 - 34.0 pg Final   MCHC 06/07/2022 32.7  30.0 - 36.0 g/dL Final   RDW 06/07/2022 12.4  11.5 - 15.5 % Final   Platelets 06/07/2022 308  150 - 400 K/uL Final   nRBC 06/07/2022 0.0  0.0 - 0.2 % Final   Neutrophils Relative %  06/07/2022 42  % Final   Neutro Abs 06/07/2022 2.4  1.7 - 7.7 K/uL Final   Lymphocytes Relative 06/07/2022 54  % Final   Lymphs Abs 06/07/2022 3.1  0.7 - 4.0 K/uL Final   Monocytes Relative 06/07/2022 4  % Final   Monocytes Absolute 06/07/2022 0.2   0.1 - 1.0 K/uL Final   Eosinophils Relative 06/07/2022 0  % Final   Eosinophils Absolute 06/07/2022 0.0  0.0 - 0.5 K/uL Final   Basophils Relative 06/07/2022 0  % Final   Basophils Absolute 06/07/2022 0.0  0.0 - 0.1 K/uL Final   Immature Granulocytes 06/07/2022 0  % Final   Abs Immature Granulocytes 06/07/2022 0.01  0.00 - 0.07 K/uL Final   Performed at [facility], [place] [address], [place], [place] [address]   Sodium 06/07/2022 139  135 - 145 mmol/L Final   Potassium 06/07/2022 4.0  3.5 - 5.1 mmol/L Final   Chloride 06/07/2022 104  98 - 111 mmol/L Final   CO2 06/07/2022 28  22 - 32 mmol/L Final   Glucose, Bld 06/07/2022 104 (H)  70 - 99 mg/dL Final   Glucose reference range applies only to samples taken after fasting for at least 8 hours.   BUN 06/07/2022 8  6 - 20 mg/dL Final   Creatinine, Ser 06/07/2022 0.84  0.44 - 1.00 mg/dL Final   Calcium 06/07/2022 9.1  8.9 - 10.3 mg/dL Final   Total Protein 06/07/2022 6.9  6.5 - 8.1 g/dL Final   Albumin 06/07/2022 3.7  3.5 - 5.0 g/dL Final   AST 06/07/2022 19  15 - 41 U/L Final   ALT 06/07/2022 22  0 - 44 U/L Final   Alkaline Phosphatase 06/07/2022 54  38 - 126 U/L Final   Total Bilirubin 06/07/2022 0.4  0.3 - 1.2 mg/dL Final   GFR, Estimated 06/07/2022 >60  >60 mL/min Final   Comment: (NOTE) Calculated using the CKD-EPI Creatinine Equation (2021)    Anion gap 06/07/2022 7  5 - 15 Final   Performed at [place] [address] [place], [place] [address]   Hgb A1c MFr Bld 06/07/2022 5.0  4.8 - 5.6 % Final   Comment: (NOTE) Pre diabetes:          5.7%-6.4%  Diabetes:              >6.4%  Glycemic control for   <7.0% adults with diabetes    Mean Plasma Glucose 06/07/2022 96.8  mg/dL Final   Performed at [facility], [place] [address] [place], [place] [address]   TSH 06/07/2022 1.620  0.350 - 4.500 uIU/mL Final   Comment: Performed by a 3rd Generation assay with a functional sensitivity of <=0.01 uIU/mL. Performed at  [facility], [place] [address] [place], [place] [address]    RPR Ser Ql 06/07/2022 NON REACTIVE  NON REACTIVE Final   Performed at [facility], [place] [address], [place], [place] [address]   Color, Urine 06/07/2022 YELLOW  YELLOW Final   APPearance 06/07/2022 HAZY (A)  CLEAR Final   Specific Gravity, Urine 06/07/2022 1.018  1.005 - 1.030 Final   pH 06/07/2022 7.0  5.0 - 8.0 Final   Glucose, UA 06/07/2022 NEGATIVE  NEGATIVE mg/dL Final   Hgb urine dipstick 06/07/2022 NEGATIVE  NEGATIVE Final   Bilirubin Urine 06/07/2022 NEGATIVE  NEGATIVE Final   Ketones, ur 06/07/2022 NEGATIVE  NEGATIVE mg/dL Final   Protein, ur 06/07/2022 NEGATIVE  NEGATIVE mg/dL Final   Nitrite 06/07/2022 NEGATIVE  NEGATIVE Final   Leukocytes,Ua 06/07/2022 NEGATIVE  NEGATIVE Final   Performed at [facility], [place] [address], [place], [place] [address]   Cholesterol 06/07/2022 164  0 - 200 mg/dL Final   Triglycerides 06/07/2022 158 (H)  <150 mg/dL Final   HDL 06/07/2022 44  >40 mg/dL Final   Total CHOL/HDL Ratio 06/07/2022 3.7  RATIO Final   VLDL 06/07/2022 32  0 - 40 mg/dL Final   LDL Cholesterol 06/07/2022 88  0 - 99 mg/dL Final   Comment:        Total Cholesterol/HDL:CHD Risk Coronary Heart Disease Risk Table                     Men   Women  1/2 Average Risk   3.4   3.3  Average Risk       5.0   4.4  2 X Average Risk   9.6   7.1  3 X Average Risk  23.4   11.0        Use the calculated Patient Ratio above and the CHD Risk Table to determine the patient's CHD Risk.        ATP III CLASSIFICATION (LDL):  <100     mg/dL   Optimal  100-129  mg/dL   Near or Above                    Optimal  130-159  mg/dL   Borderline  160-189  mg/dL   High  >190     mg/dL   Very High Performed at [place] [address] [place], [place] [address]    HIV Screen 4th Generation wRfx 06/07/2022 Non Reactive  Non Reactive Final   Performed at [facility], [place] [address] [place], [place]  [address]   SARSCOV2ONAVIRUS 2 AG 06/07/2022 NEGATIVE  NEGATIVE Final   Comment: (NOTE) SARS-CoV-2 antigen NOT DETECTED.   Negative results are presumptive.  Negative results do not preclude SARS-CoV-2 infection and should not be used as the sole basis for treatment or other patient management decisions, including infection  control decisions, particularly in the presence of clinical signs and  symptoms consistent with COVID-19, or in those who have been in contact with the virus.  Negative results must be combined with clinical observations, patient history, and epidemiological information. The expected result is Negative.  Fact Sheet for Patients: HandmadeRecipes.com.cy  Fact Sheet for Healthcare Providers: FuneralLife.at  This test is not yet approved or cleared by the [place] FDA and  has been authorized for detection and/or diagnosis of SARS-CoV-2 by FDA under an Emergency Use Authorization (EUA).  This EUA will remain in effect (meaning this test can be used) for the duration of  the COV                          ID-19 declaration under Section 564(b)(1) of the Act, 21 U.S.C. section 360bbb-3(b)(1), unless the authorization is terminated or revoked sooner.     POC Amphetamine UR 06/07/2022 None Detected  NONE DETECTED (Cut Off Level 1000 ng/mL) Final   POC Secobarbital (BAR) 06/07/2022 None Detected  NONE DETECTED (Cut Off Level 300 ng/mL) Final   POC Buprenorphine (BUP) 06/07/2022 None Detected  NONE DETECTED (Cut Off Level 10 ng/mL) Final   POC Oxazepam (BZO) 06/07/2022 None Detected  NONE DETECTED (Cut Off Level  300 ng/mL) Final   POC Cocaine UR 06/07/2022 None Detected  NONE DETECTED (Cut Off Level 300 ng/mL) Final   POC Methamphetamine UR 06/07/2022 None Detected  NONE DETECTED (Cut Off Level 1000 ng/mL) Final   POC Morphine 06/07/2022 None Detected  NONE DETECTED (Cut Off Level 300 ng/mL) Final   POC Methadone UR  06/07/2022 None Detected  NONE DETECTED (Cut Off Level 300 ng/mL) Final   POC Oxycodone UR 06/07/2022 None Detected  NONE DETECTED (Cut Off Level 100 ng/mL) Final   POC Marijuana UR 06/07/2022 None Detected  NONE DETECTED (Cut Off Level 50 ng/mL) Final   Preg Test, Ur 06/08/2022 NEGATIVE  NEGATIVE Final   Comment:        THE SENSITIVITY OF THIS METHODOLOGY IS >20 mIU/mL. Performed at [facility], [address], [place], [place] [address]    Preg Test, Ur 06/08/2022 NEGATIVE  NEGATIVE Final   Comment:        THE SENSITIVITY OF THIS METHODOLOGY IS >24 mIU/mL     Blood Alcohol level:  Lab Results  Component Value Date   ETH <10 07/04/2022   ETH <10 02/07/2022    Metabolic Disorder Labs: Lab Results  Component Value Date   HGBA1C 5.0 06/07/2022   MPG 96.8 06/07/2022   MPG 96.8 02/07/2022   No results found for: "PROLACTIN" Lab Results  Component Value Date   CHOL 181 07/25/2022   TRIG 118 07/25/2022   HDL 45 07/25/2022   CHOLHDL 4.0 07/25/2022   VLDL 24 07/25/2022   LDLCALC 112 (H) 07/25/2022   LDLCALC 88 06/07/2022    Therapeutic Lab Levels: No results found for: "LITHIUM" No results found for: "VALPROATE" No results found for: "CBMZ"  Physical Findings   GAD-7    Flowsheet Row [facility]17/[facility]  Total GAD-7 Score 17      PHQ2-9    Flowsheet Row ED from 06/07/2022 in [facility] Office Visit from 12/17/2021 [facility]  PHQ-2 Total Score 1 2  PHQ-9 Total Score 2 8      Flowsheet Row ED from 07/25/2022 in [facility] ED from 07/04/2022 in [facility] from [facility]14/2022 in [facility]   C-SSRS RISK CATEGORY High Risk No Risk No Risk        Musculoskeletal  Strength & Muscle Tone: within normal limits Gait & Station: normal Patient leans:  N/A  Psychiatric Specialty Exam  Presentation  General Appearance:  Appropriate for Environment; Casual  Eye Contact: Good  Speech: Clear and Coherent  Speech Volume: Normal  Handedness: Right   Mood and Affect  Mood: Euthymic  Affect: Congruent   Thought Process  Thought Processes: Coherent  Descriptions of Associations:Intact  Orientation:Full (Time, Place and Person)  Thought Content:Logical  Diagnosis of Schizophrenia or Schizoaffective disorder in past: No  Duration of Psychotic Symptoms: Less than six months   Hallucinations:Hallucinations: None   Ideas of Reference:None  Suicidal Thoughts:Suicidal Thoughts: No   Homicidal Thoughts:Homicidal Thoughts: No    Sensorium  Memory: Immediate Fair; Recent Fair  Judgment: Intact  Insight: Present   Executive Functions  Concentration: Good  Attention Span: Good  Recall: [employer] of Knowledge: Fair  Language: Fair   Psychomotor Activity  Psychomotor Activity: Psychomotor Activity: Normal    Assets  Assets: Communication Skills; Desire for Improvement   Sleep  Sleep: Sleep: Good    Physical Exam  Physical Exam Vitals and nursing note reviewed.  Constitutional:      General: She is not in acute distress.    Appearance: She is well-developed.  HENT:     Head: Normocephalic and atraumatic.  Eyes:     Conjunctiva/sclera: Conjunctivae normal.  Pulmonary:     Effort: Pulmonary effort is normal. No respiratory distress.     Breath sounds: Normal breath sounds.  Musculoskeletal:     Cervical back: Neck supple.  Skin:    General: Skin is warm and dry.  Neurological:     General: No focal deficit present.     Mental Status: She is alert and oriented to person, place, and time. Mental status is at baseline.  Psychiatric:        Mood and Affect: Mood normal.        Behavior: Behavior normal.        Thought Content: Thought content normal.        Judgment:  Judgment normal.    ROS Blood pressure (!) 100/58, pulse 100, temperature (!) 97.5 F (36.4 C), temperature source Oral, resp. rate 16, SpO2 100 %. There is no height or weight on file to calculate BMI.  Treatment Plan Summary: Daily contact with patient to assess and evaluate symptoms and progress in treatment and Medication management  Continue with current treatment plan on 09/08/2022  Continue Trileptal 300 mg p.o. twice daily Continue Zoloft 150 mg p.o. daily Continue Seroquel 400 mg p.o. twice daily Continue trazodone 50 mg p.o. nightly  Hypothyroidism: Continue levothyroxine 100 mcg daily  Nicotine: Continue 14 mg transdermal patch  See chart for agitation protocols   CSW to continue working on discharge disposition. Continue with scheduled [facility] meeting tomorrow morning.   Palpitations: Likely due to intermittent agitation and pacing around the unit. Low suspicion for acute cardiac etiology given right-sided pain and lack of red-flag symptoms. Plan to reassess vitals. Consider ECG if symptoms persist or worsen.   Rozelle Logan, Medical Student .09/09/2022 12:15 PM     Rozelle Logan, Medical Student 09/11/22 [phone]

## 2022-09-10 NOTE — ED Notes (Signed)
Pt sitting in the floor at the door to the nurses station. Requested for Pt to move from this location, refused and continues to sit in the floor.

## 2022-09-10 NOTE — Progress Notes (Cosign Needed Addendum)
CSW assisted with FL2.   Maryjean Ka, MSW, [facility] 09/10/2022 4:23 PM

## 2022-09-10 NOTE — Progress Notes (Signed)
Patient reports that she has been really stressed today and her anxiety is high.  She is requesting something for her anxiety and her acid reflux.

## 2022-09-10 NOTE — ED Notes (Signed)
Pt provided with snack

## 2022-09-10 NOTE — NC FL2 (Signed)
[place] MEDICAID FL2 LEVEL OF CARE SCREENING TOOL     IDENTIFICATION  Patient Name: Nicole [name] Birthdate: Dec 05, 1990 Sex: female Admission Date (Current Location): 07/25/2022  [place] and [place] Number:  Haynes Bast [id number] [facility] Facility and Address:   ([address] [place], [place] [address])      Provider Number:  [facility])  Attending Physician Name and Address:  No att. 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   Hypothyroidism 08/07/2022   Tobacco use disorder 08/07/2022   Severe episode of recurrent major depressive disorder, with psychotic features (HCC)    Suicidal ideation    Schizophrenia, unspecified (HCC) 06/10/2022   Nexplanon in place 12/17/2021    Orientation RESPIRATION BLADDER Height & Weight     Self, Time, Situation, Place  Normal Continent Weight:   Height:     BEHAVIORAL SYMPTOMS/MOOD NEUROLOGICAL BOWEL NUTRITION STATUS   (None)  (N/A) Continent Diet (Regular diet)  AMBULATORY STATUS COMMUNICATION OF NEEDS Skin   Independent Verbally Normal                       Personal Care Assistance Level of Assistance   (N/A)           Functional Limitations Info   (N/A)          SPECIAL CARE FACTORS FREQUENCY   (See MAR)                    Contractures Contractures Info: Not present    Additional Factors Info   (N/A)               Current Medications (09/10/2022):  This is the current hospital active medication list Current Facility-Administered Medications  Medication Dose Route Frequency  Provider Last Rate Last Admin   acetaminophen (TYLENOL) tablet 650 mg  650 mg Oral Q6H PRN Onuoha, Chinwendu V, NP   650 mg at 09/05/22 2219   alum & mag hydroxide-simeth (MAALOX/MYLANTA) 200-200-20 MG/5ML suspension 30 mL  30 mL Oral Q4H PRN Onuoha, Chinwendu V, NP   30 mL at 07/27/22 2151   ARIPiprazole ER (ABILIFY MAINTENA) injection 400 mg  400 mg Intramuscular Q28 days Princess Bruins, DO   400 mg at 08/30/22 1338   docusate sodium (COLACE) capsule 100 mg  100 mg Oral Daily [name], MD   100 mg at 09/10/22 0931   hydrOXYzine (ATARAX) tablet 25 mg  25 mg Oral TID PRN Onuoha, Chinwendu V, NP   25 mg at 09/10/22 1200   ibuprofen (ADVIL) tablet 400 mg  400 mg Oral Q6H PRN Onuoha, Chinwendu V, NP   400 mg at 09/06/22 1314   levothyroxine (SYNTHROID) tablet 100 mcg  100 mcg Oral Q0600 Princess Bruins, DO   100 mcg at 09/10/22 0644   ziprasidone (GEODON) injection 20 mg  20 mg Intramuscular Q12H PRN Onuoha, Chinwendu V, NP       And   LORazepam (ATIVAN) tablet 1 mg  1 mg Oral PRN Onuoha, Chinwendu V, NP       nicotine (NICODERM CQ - dosed  in mg/24 hours) patch 14 mg  14 mg Transdermal Q0600 Onuoha, Chinwendu V, NP   14 mg at 09/10/22 0931   ondansetron (ZOFRAN-ODT) disintegrating tablet 4 mg  4 mg Oral Q8H PRN Sindy Guadeloupe, NP   4 mg at 08/30/22 2122   Oxcarbazepine (TRILEPTAL) tablet 300 mg  300 mg Oral BID Onuoha, Chinwendu V, NP   300 mg at 09/10/22 0931   pantoprazole (PROTONIX) EC tablet 20 mg  20 mg Oral Daily Princess Bruins, DO   20 mg at 09/10/22 0931   QUEtiapine (SEROQUEL) tablet 400 mg  400 mg Oral BID Onuoha, Chinwendu V, NP   400 mg at 09/10/22 0931   sertraline (ZOLOFT) tablet 150 mg  150 mg Oral Daily Onuoha, Chinwendu V, NP   150 mg at 09/10/22 0932   traZODone (DESYREL) tablet 50 mg  50 mg Oral QHS PRN Onuoha, Chinwendu V, NP   50 mg at 09/09/22 2136   valACYclovir (VALTREX) tablet 500 mg  500 mg Oral Daily Princess Bruins, DO   500 mg at 09/10/22 0932   Current Outpatient  Medications  Medication Sig Dispense Refill   ABILIFY MAINTENA 400 MG PRSY prefilled syringe 400 mg every 28 (twenty-eight) days.     cetirizine (ZYRTEC) 10 MG tablet Take 10 mg by mouth daily.     cyclobenzaprine (FLEXERIL) 10 MG tablet Take 1 tablet (10 mg total) by mouth 2 (two) times daily as needed for muscle spasms. 20 tablet 0   fluticasone (FLONASE) 50 MCG/ACT nasal spray Place 1 spray into both nostrils daily.     meloxicam (MOBIC) 15 MG tablet Take 15 mg by mouth daily.     Oxcarbazepine (TRILEPTAL) 300 MG tablet Take 1 tablet (300 mg total) by mouth 2 (two) times daily. 60 tablet 0   QUEtiapine (SEROQUEL) 400 MG tablet Take 1 tablet (400 mg total) by mouth 2 (two) times daily. 60 tablet 0   sertraline (ZOLOFT) 50 MG tablet Take 3 tablets (150 mg total) by mouth in the morning. 90 tablet 0   traZODone (DESYREL) 100 MG tablet Take 1 tablet (100 mg total) by mouth at bedtime. 30 tablet 0   valACYclovir (VALTREX) 500 MG tablet Take 500 mg by mouth daily.       Discharge Medications: Please see discharge summary for a list of discharge medications.  Relevant Imaging Results:  Relevant Lab Results:   Additional Information Medication management administrated  Maryjean Ka, [name]

## 2022-09-10 NOTE — ED Notes (Signed)
Patient alert & oriented x 4. Pt observed in a good and stable condition. Pt in bed watching tv. No apparent distress noted.

## 2022-09-11 DIAGNOSIS — F333 Major depressive disorder, recurrent, severe with psychotic symptoms: Secondary | ICD-10-CM | POA: Diagnosis not present

## 2022-09-11 NOTE — ED Notes (Signed)
Patient assessed, sleeping comfortably. NAD. Respirations even and unlabored. Will continue to monitor for safety.

## 2022-09-11 NOTE — Progress Notes (Signed)
[facility] went to update the client on updates but she was sleep.  [facility] and outside agencies are continuing to research placements for the client.    [name] [facility]

## 2022-09-11 NOTE — ED Notes (Signed)
Pt removed from Obs area by provider for a meeting.

## 2022-09-11 NOTE — Progress Notes (Signed)
[facility] called the client's father and asked him to complete the ROI for the health dept.   [facility] also called a list of secondary placement options for the client.  [facility] will send out a email with their request and communication made.  [name] [facility]

## 2022-09-11 NOTE — ED Provider Notes (Signed)
Behavioral Health Progress Note  Date and Time: 09/11/2022 5:51 PM Name: Nicole [name] MRN:  [id number]  Subjective: Nicole [name] is awaiting placement via social work disposition.  She is awake, alert and oriented x 3.  Denying suicidal or homicidal ideations.  Denies auditory visual hallucinations. Patient has been medication compliant while on the unit.  No overt behaviors noted.  Reports a good appetite.  States she is resting well throughout the night.  Support and encouragement, reassurance was provided.    Today a meeting was held between this Clinical research associate, patient, patient's father (standing in for mom who is legal guardian), Elmarie Mainland from [place], Renato Shin from an AFL, and LCSWs [employer] and [name] [place]. Actionable items were determined for each party member available, and we will reconvene for updates on 12/5 at 11:00 AM. See progress note from [name] for meeting details.   Per charted history; " The transition of care team is working to establish placement. 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  Per admission assessment note: " Nicole [name] is a 31 y.o. female, with PMH of SCZ unspecified, MDD with psychosis, who presented voluntary to [facility] (07/25/2022) via [facility] after a verbal altercation with [name] (not patient's legal guardian) for transient SI. This is patient's fourth visit to [facility] for similar concerns this year.Patient has been dismissed from previous group home due to threatening SI and HI, then leaving the group home.   Legal Guardian: [name]"   Diagnosis:  Final diagnoses:  Severe episode of recurrent major depressive disorder, with psychotic features (HCC)    Total Time spent with patient: 1  hour  Past Psychiatric History:  Past Medical History:  Past Medical History:  Diagnosis Date   Anxiety    Depression    Hypothyroidism 08/07/2022   Tobacco use disorder 08/07/2022    Past Surgical History:  Procedure Laterality Date   WISDOM TOOTH EXTRACTION Bilateral 2020   Family History:  Family History  Problem Relation Age of Onset   Hypertension Father    Diabetes Father    Family Psychiatric  History:  Social History:  Social History   Substance and Sexual Activity  Alcohol Use Never     Social History   Substance and Sexual Activity  Drug Use Never    Social History   Socioeconomic History   Marital status: Single    Spouse name: Not on file   Number of children: Not on file   Years of education: Not on file   Highest education level: Not on file  Occupational History   Not on file  Tobacco Use   Smoking status: Every Day    Types: Cigarettes   Smokeless tobacco: Never  Vaping Use   Vaping Use: Some days  Substance and Sexual Activity   Alcohol use: Never   Drug use: Never   Sexual activity: Yes    Partners: Female    Birth control/protection: Implant  Other Topics Concern   Not on file  Social History Narrative   Not on file   Social Determinants of Health   Financial Resource Strain: Not on file  Food Insecurity: Not on file  Transportation Needs: Not on file  Physical Activity: Not on file  Stress: Not on file  Social Connections: Not on file   SDOH:  SDOH Screenings   Depression (PHQ2-9): Low Risk  (06/10/2022)  Tobacco Use: High Risk (09/01/2022)   Additional Social History:    Pain Medications: SEE MAR Prescriptions: SEE MAR Over the Counter: SEE MAR History of alcohol / drug use?: Yes Longest period of sobriety (when/how long): N/A Negative Consequences of Use:  (NONE) Withdrawal Symptoms: None Name of Substance 1: ETOH IN PAST--PT CURRENTLY DENIES 1 - Frequency: SOCIAL USE ETOH IN PAST 1 - Method of Aquiring:  LEGAL 1- Route of Use: ORAL DRINK Name of Substance 2: NICOTINE-VAPE AND CIGARETTES 2 - Amount (size/oz): LESS THAN ONE PACK 2 - Frequency: DAILY 2 - Method of Aquiring: LEGAL 2 - Route of Substance Use: ORAL SMOKE                Sleep: Good  Appetite:  Good  Current Medications:  Current Facility-Administered Medications  Medication Dose Route Frequency Provider Last Rate Last Admin   acetaminophen (TYLENOL) tablet 650 mg  650 mg Oral Q6H PRN Onuoha, Chinwendu V, NP   650 mg at 09/05/22 2219   alum & mag hydroxide-simeth (MAALOX/MYLANTA) 200-200-20 MG/5ML suspension 30 mL  30 mL Oral Q4H PRN Onuoha, Chinwendu V, NP   30 mL at 07/27/22 2151   ARIPiprazole ER (ABILIFY MAINTENA) injection 400 mg  400 mg Intramuscular Q28 days Princess Bruins, DO   400 mg at 08/30/22 1338   docusate sodium (COLACE) capsule 100 mg  100 mg Oral Daily [name], MD   100 mg at 09/11/22 0917   hydrOXYzine (ATARAX) tablet 25 mg  25 mg Oral TID PRN Onuoha, Chinwendu V, NP   25 mg at 09/10/22 1200   ibuprofen (ADVIL) tablet 400 mg  400 mg Oral Q6H PRN Onuoha, Chinwendu V, NP   400 mg at 09/06/22 1314   levothyroxine (SYNTHROID) tablet 100 mcg  100 mcg Oral Q0600 Princess Bruins, DO   100 mcg at 09/11/22 0610   nicotine (NICODERM CQ - dosed in mg/24 hours) patch 14 mg  14 mg Transdermal Q0600 Onuoha, Chinwendu V, NP   14 mg at 09/11/22 0916   ondansetron (ZOFRAN-ODT) disintegrating tablet 4 mg  4 mg Oral Q8H PRN Sindy Guadeloupe, NP   4 mg at 08/30/22 2122   Oxcarbazepine (TRILEPTAL) tablet 300 mg  300 mg Oral BID Onuoha, Chinwendu V, NP   300 mg at 09/11/22 0917   pantoprazole (PROTONIX) EC tablet 20 mg  20 mg Oral Daily Princess Bruins, DO   20 mg at 09/11/22 0917   QUEtiapine (SEROQUEL) tablet 400 mg  400 mg Oral BID Onuoha, Chinwendu V, NP   400 mg at 09/11/22 0917   sertraline (ZOLOFT) tablet 150 mg  150 mg Oral Daily Onuoha, Chinwendu V, NP   150 mg at 09/11/22 0917   traZODone (DESYREL) tablet 50 mg  50  mg Oral QHS PRN Onuoha, Chinwendu V, NP   50 mg at 09/10/22 2123   valACYclovir (VALTREX) tablet 500 mg  500 mg Oral Daily Princess Bruins, DO   500 mg at 09/11/22 0917   ziprasidone (GEODON) injection 20 mg  20 mg Intramuscular Q12H PRN Onuoha, Chinwendu V, NP       Current Outpatient Medications  Medication Sig Dispense Refill   ABILIFY MAINTENA 400 MG PRSY prefilled syringe 400 mg every 28 (twenty-eight) days.     cetirizine (ZYRTEC) 10 MG tablet Take 10 mg by mouth daily.     cyclobenzaprine (FLEXERIL) 10  MG tablet Take 1 tablet (10 mg total) by mouth 2 (two) times daily as needed for muscle spasms. 20 tablet 0   fluticasone (FLONASE) 50 MCG/ACT nasal spray Place 1 spray into both nostrils daily.     meloxicam (MOBIC) 15 MG tablet Take 15 mg by mouth daily.     Oxcarbazepine (TRILEPTAL) 300 MG tablet Take 1 tablet (300 mg total) by mouth 2 (two) times daily. 60 tablet 0   QUEtiapine (SEROQUEL) 400 MG tablet Take 1 tablet (400 mg total) by mouth 2 (two) times daily. 60 tablet 0   sertraline (ZOLOFT) 50 MG tablet Take 3 tablets (150 mg total) by mouth in the morning. 90 tablet 0   traZODone (DESYREL) 100 MG tablet Take 1 tablet (100 mg total) by mouth at bedtime. 30 tablet 0   valACYclovir (VALTREX) 500 MG tablet Take 500 mg by mouth daily.      Labs  Lab Results:  Admission on 07/25/2022  Component Date Value Ref Range Status   SARS Coronavirus 2 by RT PCR 07/25/2022 NEGATIVE  NEGATIVE Final   Comment: (NOTE) SARS-CoV-2 target nucleic acids are NOT DETECTED.  The SARS-CoV-2 RNA is generally detectable in upper respiratory specimens during the acute phase of infection. The lowest concentration of SARS-CoV-2 viral copies this assay can detect is 138 copies/mL. A negative result does not preclude SARS-Cov-2 infection and should not be used as the sole basis for treatment or other patient management decisions. A negative result may occur with  improper specimen collection/handling,  submission of specimen other than nasopharyngeal swab, presence of viral mutation(s) within the areas targeted by this assay, and inadequate number of viral copies(<138 copies/mL). A negative result must be combined with clinical observations, patient history, and epidemiological information. The expected result is Negative.  Fact Sheet for Patients:  BloggerCourse.com  Fact Sheet for Healthcare Providers:  SeriousBroker.it  This test is no                          t yet approved or cleared by the [place] FDA and  has been authorized for detection and/or diagnosis of SARS-CoV-2 by FDA under an Emergency Use Authorization (EUA). This EUA will remain  in effect (meaning this test can be used) for the duration of the COVID-19 declaration under Section 564(b)(1) of the Act, 21 U.S.C.section 360bbb-3(b)(1), unless the authorization is terminated  or revoked sooner.       Influenza A by PCR 07/25/2022 NEGATIVE  NEGATIVE Final   Influenza B by PCR 07/25/2022 NEGATIVE  NEGATIVE Final   Comment: (NOTE) The Xpert Xpress SARS-CoV-2/FLU/RSV plus assay is intended as an aid in the diagnosis of influenza from Nasopharyngeal swab specimens and should not be used as a sole basis for treatment. Nasal washings and aspirates are unacceptable for Xpert Xpress SARS-CoV-2/FLU/RSV testing.  Fact Sheet for Patients: BloggerCourse.com  Fact Sheet for Healthcare Providers: SeriousBroker.it  This test is not yet approved or cleared by the [place] FDA and has been authorized for detection and/or diagnosis of SARS-CoV-2 by FDA under an Emergency Use Authorization (EUA). This EUA will remain in effect (meaning this test can be used) for the duration of the COVID-19 declaration under Section 564(b)(1) of the Act, 21 U.S.C. section 360bbb-3(b)(1), unless the authorization is terminated  or revoked.  Performed at [facility], [address] [place], [place] [address]    WBC 07/25/2022 8.3  4.0 - 10.5 K/uL Final  RBC 07/25/2022 4.44  3.87 - 5.11 MIL/uL Final   Hemoglobin 07/25/2022 13.7  12.0 - 15.0 g/dL Final   HCT 40/98/119110/09/2022 40.2  36.0 - 46.0 % Final   MCV 07/25/2022 90.5  80.0 - 100.0 fL Final   MCH 07/25/2022 30.9  26.0 - 34.0 pg Final   MCHC 07/25/2022 34.1  30.0 - 36.0 g/dL Final   RDW 47/82/956210/09/2022 12.2  11.5 - 15.5 % Final   Platelets 07/25/2022 248  150 - 400 K/uL Final   nRBC 07/25/2022 0.0  0.0 - 0.2 % Final   Neutrophils Relative % 07/25/2022 43  % Final   Neutro Abs 07/25/2022 3.6  1.7 - 7.7 K/uL Final   Lymphocytes Relative 07/25/2022 52  % Final   Lymphs Abs 07/25/2022 4.2 (H)  0.7 - 4.0 K/uL Final   Monocytes Relative 07/25/2022 5  % Final   Monocytes Absolute 07/25/2022 0.4  0.1 - 1.0 K/uL Final   Eosinophils Relative 07/25/2022 0  % Final   Eosinophils Absolute 07/25/2022 0.0  0.0 - 0.5 K/uL Final   Basophils Relative 07/25/2022 0  % Final   Basophils Absolute 07/25/2022 0.0  0.0 - 0.1 K/uL Final   Immature Granulocytes 07/25/2022 0  % Final   Abs Immature Granulocytes 07/25/2022 0.02  0.00 - 0.07 K/uL Final   Performed at [facility], [address] [place], [place] [address]   Sodium 07/25/2022 138  135 - 145 mmol/L Final   Potassium 07/25/2022 4.0  3.5 - 5.1 mmol/L Final   Chloride 07/25/2022 104  98 - 111 mmol/L Final   CO2 07/25/2022 29  22 - 32 mmol/L Final   Glucose, Bld 07/25/2022 83  70 - 99 mg/dL Final   Glucose reference range applies only to samples taken after fasting for at least 8 hours.   BUN 07/25/2022 11  6 - 20 mg/dL Final   Creatinine, Ser 07/25/2022 0.97  0.44 - 1.00 mg/dL Final   Calcium 57/84/696210/09/2022 9.2  8.9 - 10.3 mg/dL Final   Total Protein 95/28/413210/09/2022 7.0  6.5 - 8.1 g/dL Final   Albumin 44/01/027210/09/2022 3.8  3.5 - 5.0 g/dL Final   AST 53/66/440310/09/2022 18  15 - 41 U/L Final   ALT 07/25/2022 22  0 - 44 U/L Final    Alkaline Phosphatase 07/25/2022 64  38 - 126 U/L Final   Total Bilirubin 07/25/2022 0.2 (L)  0.3 - 1.2 mg/dL Final   GFR, Estimated 07/25/2022 >60  >60 mL/min Final   Comment: (NOTE) Calculated using the CKD-EPI Creatinine Equation (2021)    Anion gap 07/25/2022 5  5 - 15 Final   Performed at [facility], [address] [place], [place] [address]   POC Amphetamine UR 07/25/2022 None Detected  NONE DETECTED (Cut Off Level 1000 ng/mL) Preliminary   POC Secobarbital (BAR) 07/25/2022 None Detected  NONE DETECTED (Cut Off Level 300 ng/mL) Preliminary   POC Buprenorphine (BUP) 07/25/2022 None Detected  NONE DETECTED (Cut Off Level 10 ng/mL) Preliminary   POC Oxazepam (BZO) 07/25/2022 None Detected  NONE DETECTED (Cut Off Level 300 ng/mL) Preliminary   POC Cocaine UR 07/25/2022 None Detected  NONE DETECTED (Cut Off Level 300 ng/mL) Preliminary   POC Methamphetamine UR 07/25/2022 None Detected  NONE DETECTED (Cut Off Level 1000 ng/mL) Preliminary   POC Morphine 07/25/2022 None Detected  NONE DETECTED (Cut Off Level 300 ng/mL) Preliminary   POC Methadone UR 07/25/2022 None Detected  NONE DETECTED (Cut Off Level 300  ng/mL) Preliminary   POC Oxycodone UR 07/25/2022 None Detected  NONE DETECTED (Cut Off Level 100 ng/mL) Preliminary   POC Marijuana UR 07/25/2022 None Detected  NONE DETECTED (Cut Off Level 50 ng/mL) Preliminary   SARSCOV2ONAVIRUS 2 AG 07/25/2022 NEGATIVE  NEGATIVE Final   Comment: (NOTE) SARS-CoV-2 antigen NOT DETECTED.   Negative results are presumptive.  Negative results do not preclude SARS-CoV-2 infection and should not be used as the sole basis for treatment or other patient management decisions, including infection  control decisions, particularly in the presence of clinical signs and  symptoms consistent with COVID-19, or in those who have been in contact with the virus.  Negative results must be combined with clinical observations, patient history, and  epidemiological information. The expected result is Negative.  Fact Sheet for Patients: https://www.jennings-kim.com/  Fact Sheet for Healthcare Providers: https://alexander-rogers.biz/  This test is not yet approved or cleared by the [place] FDA and  has been authorized for detection and/or diagnosis of SARS-CoV-2 by FDA under an Emergency Use Authorization (EUA).  This EUA will remain in effect (meaning this test can be used) for the duration of  the COV                          ID-19 declaration under Section 564(b)(1) of the Act, 21 U.S.C. section 360bbb-3(b)(1), unless the authorization is terminated or revoked sooner.     Cholesterol 07/25/2022 181  0 - 200 mg/dL Final   Triglycerides 42/87/6811 118  <150 mg/dL Final   HDL 57/26/2035 45  >40 mg/dL Final   Total CHOL/HDL Ratio 07/25/2022 4.0  RATIO Final   VLDL 07/25/2022 24  0 - 40 mg/dL Final   LDL Cholesterol 07/25/2022 112 (H)  0 - 99 mg/dL Final   Comment:        Total Cholesterol/HDL:CHD Risk Coronary Heart Disease Risk Table                     Men   Women  1/2 Average Risk   3.4   3.3  Average Risk       5.0   4.4  2 X Average Risk   9.6   7.1  3 X Average Risk  23.4   11.0        Use the calculated Patient Ratio above and the CHD Risk Table to determine the patient's CHD Risk.        ATP III CLASSIFICATION (LDL):  <100     mg/dL   Optimal  597-416  mg/dL   Near or Above                    Optimal  130-159  mg/dL   Borderline  384-536  mg/dL   High  >468     mg/dL   Very High Performed at [facility], [address] [place], [place] [address]    TSH 07/25/2022 6.668 (H)  0.350 - 4.500 uIU/mL Final   Comment: Performed by a 3rd Generation assay with a functional sensitivity of <=0.01 uIU/mL. Performed at [facility], [address] [place], [place] [address]    Glucose-Capillary 07/26/2022 104 (H)  70 - 99 mg/dL Final   Glucose reference range applies only to  samples taken after fasting for at least 8 hours.   T3, Free 07/31/2022 2.3  2.0 - 4.4 pg/mL Final   Comment: (NOTE) Performed At: [facility]  [place] [address] [place], [place] [id number] Jolene Schimke MD NW:[id number]    Free T4 07/31/2022 0.60 (L)  0.61 - 1.12 ng/dL Final   Comment: (NOTE) Biotin ingestion may interfere with free T4 tests. If the results are inconsistent with the TSH level, previous test results, or the clinical presentation, then consider biotin interference. If needed, order repeat testing after stopping biotin. Performed at [facility], [address], [place], [place] [address]    Glucose-Capillary 08/29/2022 100 (H)  70 - 99 mg/dL Final   Glucose reference range applies only to samples taken after fasting for at least 8 hours.   TSH 09/05/2022 0.793  0.350 - 4.500 uIU/mL Final   Comment: Performed by a 3rd Generation assay with a functional sensitivity of <=0.01 uIU/mL. Performed at [facility], [address], [place], [place] [address]    Free T4 09/05/2022 0.73  0.61 - 1.12 ng/dL Final   Comment: (NOTE) Biotin ingestion may interfere with free T4 tests. If the results are inconsistent with the TSH level, previous test results, or the clinical presentation, then consider biotin interference. If needed, order repeat testing after stopping biotin. Performed at [facility], [address] [place], [place] [address]    Preg Test, Ur 09/06/2022 Negative  Negative Final   Preg Test, Ur 09/06/2022 NEGATIVE  NEGATIVE Final   Comment:        THE SENSITIVITY OF THIS METHODOLOGY IS >24 mIU/mL    Preg Test, Ur 09/05/2022 NEGATIVE  NEGATIVE Final   Comment:        THE SENSITIVITY OF THIS METHODOLOGY IS >24 mIU/mL   Admission on 07/04/2022, Discharged on 07/05/2022  Component Date Value Ref Range Status   Sodium 07/04/2022 142  135 - 145 mmol/L Final   Potassium 07/04/2022 4.4  3.5 - 5.1 mmol/L Final   Chloride 07/04/2022 109  98  - 111 mmol/L Final   CO2 07/04/2022 26  22 - 32 mmol/L Final   Glucose, Bld 07/04/2022 96  70 - 99 mg/dL Final   Glucose reference range applies only to samples taken after fasting for at least 8 hours.   BUN 07/04/2022 15  6 - 20 mg/dL Final   Creatinine, Ser 07/04/2022 0.83  0.44 - 1.00 mg/dL Final   Calcium 24/40/1027 9.3  8.9 - 10.3 mg/dL Final   Total Protein 25/36/6440 7.2  6.5 - 8.1 g/dL Final   Albumin 34/74/2595 3.9  3.5 - 5.0 g/dL Final   AST 63/87/5643 23  15 - 41 U/L Final   ALT 07/04/2022 28  0 - 44 U/L Final   Alkaline Phosphatase 07/04/2022 77  38 - 126 U/L Final   Total Bilirubin 07/04/2022 0.4  0.3 - 1.2 mg/dL Final   GFR, Estimated 07/04/2022 >60  >60 mL/min Final   Comment: (NOTE) Calculated using the CKD-EPI Creatinine Equation (2021)    Anion gap 07/04/2022 7  5 - 15 Final   Performed at [facility], [address], [place], [place] [address]   Alcohol, Ethyl (B) 07/04/2022 <10  <10 mg/dL Final   Comment: (NOTE) Lowest detectable limit for serum alcohol is 10 mg/dL.  For medical purposes only. Performed at [facility], [address] [place], [place] [address]    WBC 07/04/2022 7.0  4.0 - 10.5 K/uL Final   RBC 07/04/2022 4.18  3.87 - 5.11 MIL/uL Final   Hemoglobin 07/04/2022 12.8  12.0 - 15.0 g/dL Final  HCT 07/04/2022 39.1  36.0 - 46.0 % Final   MCV 07/04/2022 93.5  80.0 - 100.0 fL Final   MCH 07/04/2022 30.6  26.0 - 34.0 pg Final   MCHC 07/04/2022 32.7  30.0 - 36.0 g/dL Final   RDW 19/14/7829 12.9  11.5 - 15.5 % Final   Platelets 07/04/2022 243  150 - 400 K/uL Final   nRBC 07/04/2022 0.0  0.0 - 0.2 % Final   Neutrophils Relative % 07/04/2022 43  % Final   Neutro Abs 07/04/2022 3.0  1.7 - 7.7 K/uL Final   Lymphocytes Relative 07/04/2022 50  % Final   Lymphs Abs 07/04/2022 3.5  0.7 - 4.0 K/uL Final   Monocytes Relative 07/04/2022 7  % Final   Monocytes Absolute 07/04/2022 0.5  0.1 - 1.0 K/uL Final   Eosinophils  Relative 07/04/2022 0  % Final   Eosinophils Absolute 07/04/2022 0.0  0.0 - 0.5 K/uL Final   Basophils Relative 07/04/2022 0  % Final   Basophils Absolute 07/04/2022 0.0  0.0 - 0.1 K/uL Final   Immature Granulocytes 07/04/2022 0  % Final   Abs Immature Granulocytes 07/04/2022 0.01  0.00 - 0.07 K/uL Final   Performed at [facility], [address], [place], [place] [address]   I-stat hCG, quantitative 07/04/2022 <5.0  <5 mIU/mL Final   Comment 3 07/04/2022          Final   Comment:   GEST. AGE      CONC.  (mIU/mL)   <=1 WEEK        5 - 50     2 WEEKS       50 - 500     3 WEEKS       100 - 10,000     4 WEEKS     1,000 - 30,000        FEMALE AND NON-PREGNANT FEMALE:     LESS THAN 5 mIU/mL   Admission on 06/14/2022, Discharged on 06/14/2022  Component Date Value Ref Range Status   Color, UA 06/14/2022 yellow  yellow Final   Clarity, UA 06/14/2022 cloudy (A)  clear Final   Glucose, UA 06/14/2022 negative  negative mg/dL Final   Bilirubin, UA 08/65/7846 negative  negative Final   Ketones, POC UA 06/14/2022 negative  negative mg/dL Final   Spec Grav, UA 96/29/5284 1.025  1.010 - 1.025 Final   Blood, UA 06/14/2022 negative  negative Final   pH, UA 06/14/2022 7.5  5.0 - 8.0 Final   Protein Ur, POC 06/14/2022 =30 (A)  negative mg/dL Final   Urobilinogen, UA 06/14/2022 0.2  0.2 or 1.0 E.U./dL Final   Nitrite, UA 13/24/4010 Negative  Negative Final   Leukocytes, UA 06/14/2022 Negative  Negative Final   Preg Test, Ur 06/14/2022 Negative  Negative Final   Specimen Description 06/14/2022 URINE, CLEAN CATCH   Final   Special Requests 06/14/2022    Final                   Value:NONE Performed at [facility], [address], [place], [place] [address]    Culture 06/14/2022 MULTIPLE SPECIES PRESENT, SUGGEST RECOLLECTION (A)   Final   Report Status 06/14/2022 06/16/2022 FINAL   Final  Admission on 06/07/2022, Discharged on 06/10/2022  Component Date Value Ref Range  Status   SARS Coronavirus 2 by RT PCR 06/07/2022 NEGATIVE  NEGATIVE Final   Comment: (NOTE) SARS-CoV-2 target nucleic acids are NOT DETECTED.  The SARS-CoV-2 RNA is generally detectable in upper respiratory specimens during the acute phase of infection. The lowest concentration of SARS-CoV-2 viral copies this assay can detect is 138 copies/mL. A negative result does not preclude SARS-Cov-2 infection and should not be used as the sole basis for treatment or other patient management decisions. A negative result may occur with  improper specimen collection/handling, submission of specimen other than nasopharyngeal swab, presence of viral mutation(s) within the areas targeted by this assay, and inadequate number of viral copies(<138 copies/mL). A negative result must be combined with clinical observations, patient history, and epidemiological information. The expected result is Negative.  Fact Sheet for Patients:  BloggerCourse.com  Fact Sheet for Healthcare Providers:  SeriousBroker.it  This test is no                          t yet approved or cleared by the [place] FDA and  has been authorized for detection and/or diagnosis of SARS-CoV-2 by FDA under an Emergency Use Authorization (EUA). This EUA will remain  in effect (meaning this test can be used) for the duration of the COVID-19 declaration under Section 564(b)(1) of the Act, 21 U.S.C.section 360bbb-3(b)(1), unless the authorization is terminated  or revoked sooner.       Influenza A by PCR 06/07/2022 NEGATIVE  NEGATIVE Final   Influenza B by PCR 06/07/2022 NEGATIVE  NEGATIVE Final   Comment: (NOTE) The Xpert Xpress SARS-CoV-2/FLU/RSV plus assay is intended as an aid in the diagnosis of influenza from Nasopharyngeal swab specimens and should not be used as a sole basis for treatment. Nasal washings and aspirates are unacceptable for Xpert Xpress  SARS-CoV-2/FLU/RSV testing.  Fact Sheet for Patients: BloggerCourse.com  Fact Sheet for Healthcare Providers: SeriousBroker.it  This test is not yet approved or cleared by the [place] FDA and has been authorized for detection and/or diagnosis of SARS-CoV-2 by FDA under an Emergency Use Authorization (EUA). This EUA will remain in effect (meaning this test can be used) for the duration of the COVID-19 declaration under Section 564(b)(1) of the Act, 21 U.S.C. section 360bbb-3(b)(1), unless the authorization is terminated or revoked.  Performed at [facility], [address] [place], [place] [address]    WBC 06/07/2022 5.7  4.0 - 10.5 K/uL Final   RBC 06/07/2022 4.39  3.87 - 5.11 MIL/uL Final   Hemoglobin 06/07/2022 13.2  12.0 - 15.0 g/dL Final   HCT 60/45/4098 40.4  36.0 - 46.0 % Final   MCV 06/07/2022 92.0  80.0 - 100.0 fL Final   MCH 06/07/2022 30.1  26.0 - 34.0 pg Final   MCHC 06/07/2022 32.7  30.0 - 36.0 g/dL Final   RDW 11/91/4782 12.4  11.5 - 15.5 % Final   Platelets 06/07/2022 308  150 - 400 K/uL Final   nRBC 06/07/2022 0.0  0.0 - 0.2 % Final   Neutrophils Relative % 06/07/2022 42  % Final   Neutro Abs 06/07/2022 2.4  1.7 - 7.7 K/uL Final   Lymphocytes Relative 06/07/2022 54  % Final   Lymphs Abs 06/07/2022 3.1  0.7 - 4.0 K/uL Final   Monocytes Relative 06/07/2022 4  % Final   Monocytes Absolute 06/07/2022 0.2  0.1 - 1.0 K/uL Final   Eosinophils Relative 06/07/2022 0  % Final   Eosinophils Absolute 06/07/2022 0.0  0.0 - 0.5 K/uL Final   Basophils Relative 06/07/2022 0  % Final   Basophils Absolute 06/07/2022 0.0  0.0 - 0.1 K/uL Final   Immature Granulocytes 06/07/2022 0  % Final   Abs Immature Granulocytes 06/07/2022 0.01  0.00 - 0.07 K/uL Final   Performed at [facility], [address] [place], [place] [address]   Sodium 06/07/2022 139  135 - 145 mmol/L Final   Potassium 06/07/2022 4.0  3.5 - 5.1  mmol/L Final   Chloride 06/07/2022 104  98 - 111 mmol/L Final   CO2 06/07/2022 28  22 - 32 mmol/L Final   Glucose, Bld 06/07/2022 104 (H)  70 - 99 mg/dL Final   Glucose reference range applies only to samples taken after fasting for at least 8 hours.   BUN 06/07/2022 8  6 - 20 mg/dL Final   Creatinine, Ser 06/07/2022 0.84  0.44 - 1.00 mg/dL Final   Calcium 40/34/7425 9.1  8.9 - 10.3 mg/dL Final   Total Protein 95/63/8756 6.9  6.5 - 8.1 g/dL Final   Albumin 43/32/9518 3.7  3.5 - 5.0 g/dL Final   AST 84/16/6063 19  15 - 41 U/L Final   ALT 06/07/2022 22  0 - 44 U/L Final   Alkaline Phosphatase 06/07/2022 54  38 - 126 U/L Final   Total Bilirubin 06/07/2022 0.4  0.3 - 1.2 mg/dL Final   GFR, Estimated 06/07/2022 >60  >60 mL/min Final   Comment: (NOTE) Calculated using the CKD-EPI Creatinine Equation (2021)    Anion gap 06/07/2022 7  5 - 15 Final   Performed at [facility], [address] [place], [place] [address]   Hgb A1c MFr Bld 06/07/2022 5.0  4.8 - 5.6 % Final   Comment: (NOTE) Pre diabetes:          5.7%-6.4%  Diabetes:              >6.4%  Glycemic control for   <7.0% adults with diabetes    Mean Plasma Glucose 06/07/2022 96.8  mg/dL Final   Performed at [facility], [address] [place], [place] [address]   TSH 06/07/2022 1.620  0.350 - 4.500 uIU/mL Final   Comment: Performed by a 3rd Generation assay with a functional sensitivity of <=0.01 uIU/mL. Performed at [facility], [address], [place], [place] [address]    RPR Ser Ql 06/07/2022 NON REACTIVE  NON REACTIVE Final   Performed at [facility], [address], [place], [place] [address]   Color, Urine 06/07/2022 YELLOW  YELLOW Final   APPearance 06/07/2022 HAZY (A)  CLEAR Final   Specific Gravity, Urine 06/07/2022 1.018  1.005 - 1.030 Final   pH 06/07/2022 7.0  5.0 - 8.0 Final   Glucose, UA 06/07/2022 NEGATIVE  NEGATIVE mg/dL Final   Hgb urine dipstick 06/07/2022 NEGATIVE  NEGATIVE  Final   Bilirubin Urine 06/07/2022 NEGATIVE  NEGATIVE Final   Ketones, ur 06/07/2022 NEGATIVE  NEGATIVE mg/dL Final   Protein, ur 27/03/2375 NEGATIVE  NEGATIVE mg/dL Final   Nitrite 28/31/5176 NEGATIVE  NEGATIVE Final   Leukocytes,Ua 06/07/2022 NEGATIVE  NEGATIVE Final   Performed at [facility], [address], [place], [place] [address]   Cholesterol 06/07/2022 164  0 - 200 mg/dL Final   Triglycerides 71/03/2693 158 (H)  <150 mg/dL Final   HDL 85/46/2703 44  >40 mg/dL Final   Total CHOL/HDL Ratio 06/07/2022 3.7  RATIO Final   VLDL 06/07/2022 32  0 - 40 mg/dL Final   LDL Cholesterol 06/07/2022 88  0 - 99  mg/dL Final   Comment:        Total Cholesterol/HDL:CHD Risk Coronary Heart Disease Risk Table                     Men   Women  1/2 Average Risk   3.4   3.3  Average Risk       5.0   4.4  2 X Average Risk   9.6   7.1  3 X Average Risk  23.4   11.0        Use the calculated Patient Ratio above and the CHD Risk Table to determine the patient's CHD Risk.        ATP III CLASSIFICATION (LDL):  <100     mg/dL   Optimal  161-096  mg/dL   Near or Above                    Optimal  130-159  mg/dL   Borderline  045-409  mg/dL   High  >811     mg/dL   Very High Performed at [facility], [address] [place], [place] [address]    HIV Screen 4th Generation wRfx 06/07/2022 Non Reactive  Non Reactive Final   Performed at [facility], [address], [place], [place] [address]   SARSCOV2ONAVIRUS 2 AG 06/07/2022 NEGATIVE  NEGATIVE Final   Comment: (NOTE) SARS-CoV-2 antigen NOT DETECTED.   Negative results are presumptive.  Negative results do not preclude SARS-CoV-2 infection and should not be used as the sole basis for treatment or other patient management decisions, including infection  control decisions, particularly in the presence of clinical signs and  symptoms consistent with COVID-19, or in those who have been in contact with the virus.  Negative results  must be combined with clinical observations, patient history, and epidemiological information. The expected result is Negative.  Fact Sheet for Patients: https://www.jennings-kim.com/  Fact Sheet for Healthcare Providers: https://alexander-rogers.biz/  This test is not yet approved or cleared by the [place] FDA and  has been authorized for detection and/or diagnosis of SARS-CoV-2 by FDA under an Emergency Use Authorization (EUA).  This EUA will remain in effect (meaning this test can be used) for the duration of  the COV                          ID-19 declaration under Section 564(b)(1) of the Act, 21 U.S.C. section 360bbb-3(b)(1), unless the authorization is terminated or revoked sooner.     POC Amphetamine UR 06/07/2022 None Detected  NONE DETECTED (Cut Off Level 1000 ng/mL) Final   POC Secobarbital (BAR) 06/07/2022 None Detected  NONE DETECTED (Cut Off Level 300 ng/mL) Final   POC Buprenorphine (BUP) 06/07/2022 None Detected  NONE DETECTED (Cut Off Level 10 ng/mL) Final   POC Oxazepam (BZO) 06/07/2022 None Detected  NONE DETECTED (Cut Off Level 300 ng/mL) Final   POC Cocaine UR 06/07/2022 None Detected  NONE DETECTED (Cut Off Level 300 ng/mL) Final   POC Methamphetamine UR 06/07/2022 None Detected  NONE DETECTED (Cut Off Level 1000 ng/mL) Final   POC Morphine 06/07/2022 None Detected  NONE DETECTED (Cut Off Level 300 ng/mL) Final   POC Methadone UR 06/07/2022 None Detected  NONE DETECTED (Cut Off Level 300 ng/mL) Final   POC Oxycodone UR 06/07/2022 None Detected  NONE DETECTED (Cut Off Level 100 ng/mL) Final   POC Marijuana UR 06/07/2022 None Detected  NONE DETECTED (Cut Off Level 50 ng/mL) Final   Preg Test, Ur 06/08/2022 NEGATIVE  NEGATIVE Final   Comment:        THE SENSITIVITY OF THIS METHODOLOGY IS >20 mIU/mL. Performed at [facility], [address] [place], [place] [address]    Preg Test, Ur 06/08/2022 NEGATIVE  NEGATIVE Final    Comment:        THE SENSITIVITY OF THIS METHODOLOGY IS >24 mIU/mL     Blood Alcohol level:  Lab Results  Component Value Date   ETH <10 07/04/2022   ETH <10 02/07/2022    Metabolic Disorder Labs: Lab Results  Component Value Date   HGBA1C 5.0 06/07/2022   MPG 96.8 06/07/2022   MPG 96.8 02/07/2022   No results found for: "PROLACTIN" Lab Results  Component Value Date   CHOL 181 07/25/2022   TRIG 118 07/25/2022   HDL 45 07/25/2022   CHOLHDL 4.0 07/25/2022   VLDL 24 07/25/2022   LDLCALC 112 (H) 07/25/2022   LDLCALC 88 06/07/2022    Therapeutic Lab Levels: No results found for: "LITHIUM" No results found for: "VALPROATE" No results found for: "CBMZ"  Physical Findings   GAD-7    Flowsheet Row [facility]17/[facility]  Total GAD-7 Score 17      PHQ2-9    Flowsheet Row ED from 06/07/2022 in [facility] Office Visit from 12/17/2021 in [facility]  PHQ-2 Total Score 1 2  PHQ-9 Total Score 2 8      Flowsheet Row ED from 07/25/2022 in [facility] ED from 07/04/2022 in [facility] from 06/14/2022 in [facility]   C-SSRS RISK CATEGORY High Risk No Risk No Risk        Musculoskeletal  Strength & Muscle Tone: within normal limits Gait & Station: normal Patient leans: N/A  Psychiatric Specialty Exam  Presentation  General Appearance:  Appropriate for Environment; Casual  Eye Contact: Good  Speech: Clear and Coherent  Speech Volume: Normal  Handedness: Right   Mood and Affect  Mood: Euthymic  Affect: Congruent   Thought Process  Thought Processes: Coherent  Descriptions of Associations:Intact  Orientation:Full (Time, Place and Person)  Thought Content:Logical  Diagnosis of Schizophrenia or Schizoaffective disorder in past: No  Duration of  Psychotic Symptoms: Less than six months   Hallucinations:Hallucinations: None   Ideas of Reference:None  Suicidal Thoughts:Suicidal Thoughts: No   Homicidal Thoughts:Homicidal Thoughts: No    Sensorium  Memory: Immediate Fair; Recent Fair  Judgment: Intact  Insight: Present   Executive Functions  Concentration: Good  Attention Span: Good  Recall: [employer] of Knowledge: Fair  Language: Fair   Psychomotor Activity  Psychomotor Activity: Psychomotor Activity: Normal    Assets  Assets: Communication Skills; Desire for Improvement   Sleep  Sleep: Sleep: Good    Physical Exam  Physical Exam ROS Blood pressure (!) 122/92, pulse (!) 108, temperature 98.2 F (36.8 C), temperature source Oral, resp. rate 18, SpO2 100 %. There is no height or weight on file to calculate BMI.  Treatment Plan Summary: Daily contact with patient to assess and evaluate symptoms and progress in treatment and Medication management  Continue with current treatment plan on 09/08/2022  Continue Trileptal 300 mg p.o. twice daily Continue Zoloft 150 mg p.o. daily Continue Seroquel 400 mg p.o. twice daily Continue trazodone  50 mg p.o. nightly  Hypothyroidism: Continue levothyroxine 100 mcg daily  Nicotine: Continue 14 mg transdermal patch  See chart for agitation protocols   CSW to continue working on discharge disposition  [name], MD .09/11/2022 5:51 PM

## 2022-09-11 NOTE — ED Notes (Signed)
Patient sleeping comfortably. NAD. Respirations even and unlabored. Will continue to monitor for safety.

## 2022-09-11 NOTE — ED Notes (Signed)
Pt sitting in a chair watching television. Pt calm and cooperative. No c/o pain or distress. Alert and orient x 4. Will continue  to monitor for safety

## 2022-09-11 NOTE — ED Notes (Signed)
Pt sleeping@this time. Breathing even and unlabored. Will continue to monitor for safety 

## 2022-09-11 NOTE — ED Notes (Signed)
Pt resting quietly, breathing is even and unlabored.  Pt denies SI, HI, pain and AVH.  Will continue to monitor for safety.  

## 2022-09-11 NOTE — Progress Notes (Signed)
[name] Progress Note  0930 - Contacted Renato Shin, [phone], for an email address to invite her to the 1100 treatment team meeting.  Also spoke with [name], pt's father regarding the transfer of guardianship and getting permission for information to be shared with other group homes for placement.  1100 - Started Immunologist.  In attendance were [name] Carmela Hurt (care coordinator), Elmarie Mainland [facility] Coordinator), Dr. [name], Nicole [name] (patient), Ms. Renato Shin ([facility]), [name] (father/uncle), pt's aunt, and Hansel Starling, [place].    During the meeting, [name] was able to locate a CCA and PCP that were completed in July 2023.  Those documents were forwarded to Ms. Sakina Briones to be uploaded into the pt's chart.  [name] was tasked to get a consent for release of information from Mr. and Mrs. [name] so records from [facility] can be accessed.  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  [name] agreed to submit new information to Dana Allan, I/DD Director-Innov[facility] at [facility].  Dr. Alfonse Flavors will be meeting with the pt tomorrow morning to complete the [place] Adaptive Behavior Scales via electronic method.  [name] will collaborate with [name] to complete the paperwork for transfer of guardianship and turn it in by 17 September 2022 if not by 13 September 2022.  Efforts will continue to find alternative placement if the pt's preferential placement is denied. [name] was given a lead to [facility] in [place], [place] - POC: Barnetta Chapel @ [phone].  Next meeting is 17 September 2022 @ 1100.  Night crew social worker, Ginger Organ, [name] was tasked to contact group homes and AFL's to inquire about availability.     Hansel Starling, MSW, [name] [facility] [phone] phone [phone] fax

## 2022-09-12 NOTE — ED Notes (Signed)
Snack given.

## 2022-09-12 NOTE — Progress Notes (Signed)
Noted elevated BP at 1800 check. Notified Provider of elevated BP . Pt reports '' I'm stressing about that test I took. ''  Pt appears in no acute distress. No orders received. Will con't to monitor.

## 2022-09-12 NOTE — Progress Notes (Signed)
[name] Progress Note   Pt's FL2 and the results from the [facility] testing was sent via secure email to Elmarie Mainland, [facility].  Pt's PCP and CCA were labeled and sent to be uploaded into pt's chart.   Hansel Starling, MSW, [name] [facility] [phone] phone [phone] fax

## 2022-09-12 NOTE — ED Notes (Signed)
Pt escorted to courtyard. Encouraged exercise and pt complied. Pt also practiced daily meditation and deep breathing with Clinical research associate.

## 2022-09-12 NOTE — ED Notes (Signed)
Pt is in the bed resting. Respirations are even and unlabored. No acute distress noted. Will continue to monitor for safety 

## 2022-09-12 NOTE — ED Notes (Signed)
Pt sleeping@this time. Breathing even and unlabored. Will continue to monitor for safety 

## 2022-09-12 NOTE — ED Notes (Signed)
Pt resting quietly. Respirations even, unlabored. Pt is safe.

## 2022-09-13 LAB — BASIC METABOLIC PANEL
Anion gap: 10 (ref 5–15)
BUN: 14 mg/dL (ref 6–20)
CO2: 21 mmol/L — ABNORMAL LOW (ref 22–32)
Calcium: 9.1 mg/dL (ref 8.9–10.3)
Chloride: 105 mmol/L (ref 98–111)
Creatinine, Ser: 0.92 mg/dL (ref 0.44–1.00)
GFR, Estimated: >60 mL/min (ref >60)
Glucose, Bld: 111 mg/dL — ABNORMAL HIGH (ref 70–99)
Potassium: 4.5 mmol/L (ref 3.5–5.1)
Sodium: 136 mmol/L (ref 135–145)

## 2022-09-13 LAB — VITAMIN B12: Vitamin B-12: 585 pg/mL (ref 180–914)

## 2022-09-13 MED ORDER — METFORMIN HCL 500 MG PO TABS
500.0000 mg | ORAL_TABLET | Freq: Every day | ORAL | Status: DC
  Administered 2022-09-14 – 2022-10-07 (×24): 500 mg via ORAL
  Filled 2022-09-13 (×24): qty 1

## 2022-09-13 MED ORDER — POLYETHYLENE GLYCOL 3350 17 G PO PACK
17.0000 g | PACK | Freq: Once | ORAL | Status: AC
  Administered 2022-09-13: 17 g via ORAL
  Filled 2022-09-13: qty 1

## 2022-09-13 MED ORDER — TRAZODONE HCL 50 MG PO TABS
50.0000 mg | ORAL_TABLET | Freq: Every day | ORAL | Status: DC
  Administered 2022-09-13 – 2022-09-14 (×2): 50 mg via ORAL
  Filled 2022-09-13 (×2): qty 1

## 2022-09-13 NOTE — ED Notes (Signed)
NP Ene is made known of pt requesting for ativan.

## 2022-09-13 NOTE — ED Notes (Signed)
Pt is in the bed watching TV. Respirations are even and unlabored. No acute distress noted. Will continue to monitor for safety.  

## 2022-09-13 NOTE — ED Notes (Signed)
Pt is agitated stating she does not know why she hasn't gotten the ativan yet.

## 2022-09-13 NOTE — ED Provider Notes (Signed)
Behavioral Health Progress Note  Date and Time: 09/12/2022 8:28 AM Name: Nicole [name] MRN:  [id number]  Subjective: Nicole [name] is awaiting placement via social work disposition.  She is awake, alert and oriented x 3.  Denying suicidal or homicidal ideations.  Denies auditory visual hallucinations. Patient has been medication compliant while on the unit.  No overt behaviors noted.  Reports a good appetite.  States she is resting well throughout the night.  Support and encouragement, reassurance was provided. Discussed lab draw to assess for hyponatremia and b12 level prior to starting metformin. Spoke to her that we would speak to guardian for approval.  Dr. Alfonse Flavors present during assessment to discuss testing that was present yesterday. Discussed IQ was borderline but ABC testing 80 yesterday showed inability to function which will be support for AFL. Discussed lack of adaptive and significant maladaptive abilities. Discussed we would be talking with [employer] regarding these results and [name] to assist with possible.   Patient as initial admitted  due to verbal altercation.  Per admission assessment note: " Nicole [name] is a 31 y.o. female, with PMH of SCZ unspecified, MDD with psychosis, who presented voluntary to [facility] (07/25/2022) via [facility] after a verbal altercation with Nicole [name] (not patient's legal guardian) for transient SI. This is patient's fourth visit to [facility] for similar concerns this year.Patient has been dismissed from previous group home due to threatening SI and HI, then leaving the group home.    Legal Guardian: Mom Nicole [name]) transitioning to be Dad Nicole [name]) Point of contact: Dad Nicole [name])"   Diagnosis:  Final diagnoses:  Severe episode of recurrent major depressive disorder, with psychotic features (HCC)    Total Time spent with patient: 30 minutes  Past Psychiatric History:  Past Medical History:  Past Medical History:  Diagnosis Date  . Anxiety    . Depression   . Hypothyroidism 08/07/2022  . Tobacco use disorder 08/07/2022    Past Surgical History:  Procedure Laterality Date  . WISDOM TOOTH EXTRACTION Bilateral 2020   Family History:  Family History  Problem Relation Age of Onset  . Hypertension Father   . Diabetes Father    Family Psychiatric  History:  Social History:  Social History   Substance and Sexual Activity  Alcohol Use Never     Social History   Substance and Sexual Activity  Drug Use Never    Social History   Socioeconomic History  . Marital status: Single    Spouse name: Not on file  . Number of children: Not on file  . Years of education: Not on file  . Highest education level: Not on file  Occupational History  . Not on file  Tobacco Use  . Smoking status: Every Day    Types: Cigarettes  . Smokeless tobacco: Never  Vaping Use  . Vaping Use: Some days  Substance and Sexual Activity  . Alcohol use: Never  . Drug use: Never  . Sexual activity: Yes    Partners: Female    Birth control/protection: Implant  Other Topics Concern  . Not on file  Social History Narrative  . Not on file   Social Determinants of Health   Financial Resource Strain: Not on file  Food Insecurity: Not on file  Transportation Needs: Not on file  Physical Activity: Not on file  Stress: Not on file  Social Connections: Not on file   SDOH:  SDOH Screenings   Depression (PHQ2-9): Low Risk  (06/10/2022)  Tobacco Use: High  Risk (09/01/2022)   Additional Social History:    Pain Medications: SEE MAR Prescriptions: SEE MAR Over the Counter: SEE MAR History of alcohol / drug use?: Yes Longest period of sobriety (when/how long): N/A Negative Consequences of Use:  (NONE) Withdrawal Symptoms: None Name of Substance 1: ETOH IN PAST--PT CURRENTLY DENIES 1 - Frequency: SOCIAL USE ETOH IN PAST 1 - Method of Aquiring: LEGAL 1- Route of Use: ORAL DRINK Name of Substance 2: NICOTINE-VAPE AND CIGARETTES 2 - Amount  (size/oz): LESS THAN ONE PACK 2 - Frequency: DAILY 2 - Method of Aquiring: LEGAL 2 - Route of Substance Use: ORAL SMOKE                Sleep: Good  Appetite:  Good  Current Medications:  Current Facility-Administered Medications  Medication Dose Route Frequency Provider Last Rate Last Admin  . acetaminophen (TYLENOL) tablet 650 mg  650 mg Oral Q6H PRN Onuoha, Chinwendu V, NP   650 mg at 09/12/22 2105  . alum & mag hydroxide-simeth (MAALOX/MYLANTA) 200-200-20 MG/5ML suspension 30 mL  30 mL Oral Q4H PRN Onuoha, Chinwendu V, NP   30 mL at 07/27/22 2151  . ARIPiprazole ER (ABILIFY MAINTENA) injection 400 mg  400 mg Intramuscular Q28 days Princess Bruinsguyen, Julie, DO   400 mg at 08/30/22 1338  . docusate sodium (COLACE) capsule 100 mg  100 mg Oral Daily Lamar Sprinklesosby, Courtney, MD   100 mg at 09/12/22 0949  . hydrOXYzine (ATARAX) tablet 25 mg  25 mg Oral TID PRN Onuoha, Chinwendu V, NP   25 mg at 09/11/22 2120  . ibuprofen (ADVIL) tablet 400 mg  400 mg Oral Q6H PRN Onuoha, Chinwendu V, NP   400 mg at 09/06/22 1314  . levothyroxine (SYNTHROID) tablet 100 mcg  100 mcg Oral Q0600 Princess BruinsNguyen, Julie, DO   100 mcg at 09/13/22 0636  . nicotine (NICODERM CQ - dosed in mg/24 hours) patch 14 mg  14 mg Transdermal Q0600 Onuoha, Chinwendu V, NP   14 mg at 09/12/22 0948  . ondansetron (ZOFRAN-ODT) disintegrating tablet 4 mg  4 mg Oral Q8H PRN Sindy GuadeloupeWilliams, Roy, NP   4 mg at 08/30/22 2122  . Oxcarbazepine (TRILEPTAL) tablet 300 mg  300 mg Oral BID Onuoha, Chinwendu V, NP   300 mg at 09/12/22 2105  . pantoprazole (PROTONIX) EC tablet 20 mg  20 mg Oral Daily Princess BruinsNguyen, Julie, DO   20 mg at 09/12/22 0949  . QUEtiapine (SEROQUEL) tablet 400 mg  400 mg Oral BID Onuoha, Chinwendu V, NP   400 mg at 09/12/22 2105  . sertraline (ZOLOFT) tablet 150 mg  150 mg Oral Daily Onuoha, Chinwendu V, NP   150 mg at 09/12/22 0949  . traZODone (DESYREL) tablet 50 mg  50 mg Oral QHS PRN Onuoha, Chinwendu V, NP   50 mg at 09/12/22 2105  . valACYclovir  (VALTREX) tablet 500 mg  500 mg Oral Daily Princess Bruinsguyen, Julie, DO   500 mg at 09/12/22 0949  . ziprasidone (GEODON) injection 20 mg  20 mg Intramuscular Q12H PRN Onuoha, Chinwendu V, NP       Current Outpatient Medications  Medication Sig Dispense Refill  . ABILIFY MAINTENA 400 MG PRSY prefilled syringe 400 mg every 28 (twenty-eight) days.    . cetirizine (ZYRTEC) 10 MG tablet Take 10 mg by mouth daily.    . cyclobenzaprine (FLEXERIL) 10 MG tablet Take 1 tablet (10 mg total) by mouth 2 (two) times daily as needed for muscle spasms. 20 tablet 0  .  fluticasone (FLONASE) 50 MCG/ACT nasal spray Place 1 spray into both nostrils daily.    . meloxicam (MOBIC) 15 MG tablet Take 15 mg by mouth daily.    . Oxcarbazepine (TRILEPTAL) 300 MG tablet Take 1 tablet (300 mg total) by mouth 2 (two) times daily. 60 tablet 0  . QUEtiapine (SEROQUEL) 400 MG tablet Take 1 tablet (400 mg total) by mouth 2 (two) times daily. 60 tablet 0  . sertraline (ZOLOFT) 50 MG tablet Take 3 tablets (150 mg total) by mouth in the morning. 90 tablet 0  . traZODone (DESYREL) 100 MG tablet Take 1 tablet (100 mg total) by mouth at bedtime. 30 tablet 0  . valACYclovir (VALTREX) 500 MG tablet Take 500 mg by mouth daily.      Labs  Lab Results:  Admission on 07/25/2022  Component Date Value Ref Range Status  . SARS Coronavirus 2 by RT PCR 07/25/2022 NEGATIVE  NEGATIVE Final   Comment: (NOTE) SARS-CoV-2 target nucleic acids are NOT DETECTED.  The SARS-CoV-2 RNA is generally detectable in upper respiratory specimens during the acute phase of infection. The lowest concentration of SARS-CoV-2 viral copies this assay can detect is 138 copies/mL. A negative result does not preclude SARS-Cov-2 infection and should not be used as the sole basis for treatment or other patient management decisions. A negative result may occur with  improper specimen collection/handling, submission of specimen other than nasopharyngeal swab, presence of viral  mutation(s) within the areas targeted by this assay, and inadequate number of viral copies(<138 copies/mL). A negative result must be combined with clinical observations, patient history, and epidemiological information. The expected result is Negative.  Fact Sheet for Patients:  BloggerCourse.com  Fact Sheet for Healthcare Providers:  SeriousBroker.it  This test is no                          t yet approved or cleared by the [place] FDA and  has been authorized for detection and/or diagnosis of SARS-CoV-2 by FDA under an Emergency Use Authorization (EUA). This EUA will remain  in effect (meaning this test can be used) for the duration of the COVID-19 declaration under Section 564(b)(1) of the Act, 21 U.S.C.section 360bbb-3(b)(1), unless the authorization is terminated  or revoked sooner.      . Influenza A by PCR 07/25/2022 NEGATIVE  NEGATIVE Final  . Influenza B by PCR 07/25/2022 NEGATIVE  NEGATIVE Final   Comment: (NOTE) The Xpert Xpress SARS-CoV-2/FLU/RSV plus assay is intended as an aid in the diagnosis of influenza from Nasopharyngeal swab specimens and should not be used as a sole basis for treatment. Nasal washings and aspirates are unacceptable for Xpert Xpress SARS-CoV-2/FLU/RSV testing.  Fact Sheet for Patients: BloggerCourse.com  Fact Sheet for Healthcare Providers: SeriousBroker.it  This test is not yet approved or cleared by the [place] FDA and has been authorized for detection and/or diagnosis of SARS-CoV-2 by FDA under an Emergency Use Authorization (EUA). This EUA will remain in effect (meaning this test can be used) for the duration of the COVID-19 declaration under Section 564(b)(1) of the Act, 21 U.S.C. section 360bbb-3(b)(1), unless the authorization is terminated or revoked.  Performed at [facility], [address], [place],  [place] [address]   . WBC 07/25/2022 8.3  4.0 - 10.5 K/uL Final  . RBC 07/25/2022 4.44  3.87 - 5.11 MIL/uL Final  . Hemoglobin 07/25/2022 13.7  12.0 - 15.0 g/dL Final  . HCT 60/45/4098  40.2  36.0 - 46.0 % Final  . MCV 07/25/2022 90.5  80.0 - 100.0 fL Final  . MCH 07/25/2022 30.9  26.0 - 34.0 pg Final  . MCHC 07/25/2022 34.1  30.0 - 36.0 g/dL Final  . RDW 40/98/1191 12.2  11.5 - 15.5 % Final  . Platelets 07/25/2022 248  150 - 400 K/uL Final  . nRBC 07/25/2022 0.0  0.0 - 0.2 % Final  . Neutrophils Relative % 07/25/2022 43  % Final  . Neutro Abs 07/25/2022 3.6  1.7 - 7.7 K/uL Final  . Lymphocytes Relative 07/25/2022 52  % Final  . Lymphs Abs 07/25/2022 4.2 (H)  0.7 - 4.0 K/uL Final  . Monocytes Relative 07/25/2022 5  % Final  . Monocytes Absolute 07/25/2022 0.4  0.1 - 1.0 K/uL Final  . Eosinophils Relative 07/25/2022 0  % Final  . Eosinophils Absolute 07/25/2022 0.0  0.0 - 0.5 K/uL Final  . Basophils Relative 07/25/2022 0  % Final  . Basophils Absolute 07/25/2022 0.0  0.0 - 0.1 K/uL Final  . Immature Granulocytes 07/25/2022 0  % Final  . Abs Immature Granulocytes 07/25/2022 0.02  0.00 - 0.07 K/uL Final   Performed at [facility], [address], [place], [place] [address]  . Sodium 07/25/2022 138  135 - 145 mmol/L Final  . Potassium 07/25/2022 4.0  3.5 - 5.1 mmol/L Final  . Chloride 07/25/2022 104  98 - 111 mmol/L Final  . CO2 07/25/2022 29  22 - 32 mmol/L Final  . Glucose, Bld 07/25/2022 83  70 - 99 mg/dL Final   Glucose reference range applies only to samples taken after fasting for at least 8 hours.  . BUN 07/25/2022 11  6 - 20 mg/dL Final  . Creatinine, Ser 07/25/2022 0.97  0.44 - 1.00 mg/dL Final  . Calcium 56/21/3086 9.2  8.9 - 10.3 mg/dL Final  . Total Protein 07/25/2022 7.0  6.5 - 8.1 g/dL Final  . Albumin 57/84/6962 3.8  3.5 - 5.0 g/dL Final  . AST 95/28/4132 18  15 - 41 U/L Final  . ALT 07/25/2022 22  0 - 44 U/L Final  . Alkaline Phosphatase 07/25/2022 64  38 - 126 U/L  Final  . Total Bilirubin 07/25/2022 0.2 (L)  0.3 - 1.2 mg/dL Final  . GFR, Estimated 07/25/2022 >60  >60 mL/min Final   Comment: (NOTE) Calculated using the CKD-EPI Creatinine Equation (2021)   . Anion gap 07/25/2022 5  5 - 15 Final   Performed at [facility], [address] [place], [place] [address]  . POC Amphetamine UR 07/25/2022 None Detected  NONE DETECTED (Cut Off Level 1000 ng/mL) Preliminary  . POC Secobarbital (BAR) 07/25/2022 None Detected  NONE DETECTED (Cut Off Level 300 ng/mL) Preliminary  . POC Buprenorphine (BUP) 07/25/2022 None Detected  NONE DETECTED (Cut Off Level 10 ng/mL) Preliminary  . POC Oxazepam (BZO) 07/25/2022 None Detected  NONE DETECTED (Cut Off Level 300 ng/mL) Preliminary  . POC Cocaine UR 07/25/2022 None Detected  NONE DETECTED (Cut Off Level 300 ng/mL) Preliminary  . POC Methamphetamine UR 07/25/2022 None Detected  NONE DETECTED (Cut Off Level 1000 ng/mL) Preliminary  . POC Morphine 07/25/2022 None Detected  NONE DETECTED (Cut Off Level 300 ng/mL) Preliminary  . POC Methadone UR 07/25/2022 None Detected  NONE DETECTED (Cut Off Level 300 ng/mL) Preliminary  . POC Oxycodone UR 07/25/2022 None Detected  NONE DETECTED (Cut Off Level 100 ng/mL) Preliminary  . POC Marijuana UR  07/25/2022 None Detected  NONE DETECTED (Cut Off Level 50 ng/mL) Preliminary  . SARSCOV2ONAVIRUS 2 AG 07/25/2022 NEGATIVE  NEGATIVE Final   Comment: (NOTE) SARS-CoV-2 antigen NOT DETECTED.   Negative results are presumptive.  Negative results do not preclude SARS-CoV-2 infection and should not be used as the sole basis for treatment or other patient management decisions, including infection  control decisions, particularly in the presence of clinical signs and  symptoms consistent with COVID-19, or in those who have been in contact with the virus.  Negative results must be combined with clinical observations, patient history, and epidemiological information. The expected result is  Negative.  Fact Sheet for Patients: https://www.jennings-kim.com/  Fact Sheet for Healthcare Providers: https://alexander-rogers.biz/  This test is not yet approved or cleared by the [place] FDA and  has been authorized for detection and/or diagnosis of SARS-CoV-2 by FDA under an Emergency Use Authorization (EUA).  This EUA will remain in effect (meaning this test can be used) for the duration of  the COV                          ID-19 declaration under Section 564(b)(1) of the Act, 21 U.S.C. section 360bbb-3(b)(1), unless the authorization is terminated or revoked sooner.    . Cholesterol 07/25/2022 181  0 - 200 mg/dL Final  . Triglycerides 07/25/2022 118  <150 mg/dL Final  . HDL 16/07/9603 45  >40 mg/dL Final  . Total CHOL/HDL Ratio 07/25/2022 4.0  RATIO Final  . VLDL 07/25/2022 24  0 - 40 mg/dL Final  . LDL Cholesterol 07/25/2022 112 (H)  0 - 99 mg/dL Final   Comment:        Total Cholesterol/HDL:CHD Risk Coronary Heart Disease Risk Table                     Men   Women  1/2 Average Risk   3.4   3.3  Average Risk       5.0   4.4  2 X Average Risk   9.6   7.1  3 X Average Risk  23.4   11.0        Use the calculated Patient Ratio above and the CHD Risk Table to determine the patient's CHD Risk.        ATP III CLASSIFICATION (LDL):  <100     mg/dL   Optimal  540-981  mg/dL   Near or Above                    Optimal  130-159  mg/dL   Borderline  191-478  mg/dL   High  >295     mg/dL   Very High Performed at [facility], [address] [place], [place] [address]   . TSH 07/25/2022 6.668 (H)  0.350 - 4.500 uIU/mL Final   Comment: Performed by a 3rd Generation assay with a functional sensitivity of <=0.01 uIU/mL. Performed at [facility], [address], [place], [place] [address]   . Glucose-Capillary 07/26/2022 104 (H)  70 - 99 mg/dL Final   Glucose reference range applies only to samples taken after fasting for at least 8  hours.  . T3, Free 07/31/2022 2.3  2.0 - 4.4 pg/mL Final   Comment: (NOTE) Performed At: [facility] [address] [place], [place] [id number] Jolene Schimke MD UX:[id number]   . Free T4 07/31/2022 0.60 (L)  0.61 - 1.12 ng/dL  Final   Comment: (NOTE) Biotin ingestion may interfere with free T4 tests. If the results are inconsistent with the TSH level, previous test results, or the clinical presentation, then consider biotin interference. If needed, order repeat testing after stopping biotin. Performed at [facility], [address], [place], [place] [address]   . Glucose-Capillary 08/29/2022 100 (H)  70 - 99 mg/dL Final   Glucose reference range applies only to samples taken after fasting for at least 8 hours.  Marland Kitchen TSH 09/05/2022 0.793  0.350 - 4.500 uIU/mL Final   Comment: Performed by a 3rd Generation assay with a functional sensitivity of <=0.01 uIU/mL. Performed at [facility], [address], [place], [place] [address]   . Free T4 09/05/2022 0.73  0.61 - 1.12 ng/dL Final   Comment: (NOTE) Biotin ingestion may interfere with free T4 tests. If the results are inconsistent with the TSH level, previous test results, or the clinical presentation, then consider biotin interference. If needed, order repeat testing after stopping biotin. Performed at [facility], [address] [place], [place] [address]   . Preg Test, Ur 09/06/2022 Negative  Negative Final  . Preg Test, Ur 09/06/2022 NEGATIVE  NEGATIVE Final   Comment:        THE SENSITIVITY OF THIS METHODOLOGY IS >24 mIU/mL   . Preg Test, Ur 09/05/2022 NEGATIVE  NEGATIVE Final   Comment:        THE SENSITIVITY OF THIS METHODOLOGY IS >24 mIU/mL   Admission on 07/04/2022, Discharged on 07/05/2022  Component Date Value Ref Range Status  . Sodium 07/04/2022 142  135 - 145 mmol/L Final  . Potassium 07/04/2022 4.4  3.5 - 5.1 mmol/L Final  . Chloride 07/04/2022 109  98 - 111 mmol/L Final  . CO2  07/04/2022 26  22 - 32 mmol/L Final  . Glucose, Bld 07/04/2022 96  70 - 99 mg/dL Final   Glucose reference range applies only to samples taken after fasting for at least 8 hours.  . BUN 07/04/2022 15  6 - 20 mg/dL Final  . Creatinine, Ser 07/04/2022 0.83  0.44 - 1.00 mg/dL Final  . Calcium 27/78/2423 9.3  8.9 - 10.3 mg/dL Final  . Total Protein 07/04/2022 7.2  6.5 - 8.1 g/dL Final  . Albumin 53/61/4431 3.9  3.5 - 5.0 g/dL Final  . AST 54/00/8676 23  15 - 41 U/L Final  . ALT 07/04/2022 28  0 - 44 U/L Final  . Alkaline Phosphatase 07/04/2022 77  38 - 126 U/L Final  . Total Bilirubin 07/04/2022 0.4  0.3 - 1.2 mg/dL Final  . GFR, Estimated 07/04/2022 >60  >60 mL/min Final   Comment: (NOTE) Calculated using the CKD-EPI Creatinine Equation (2021)   . Anion gap 07/04/2022 7  5 - 15 Final   Performed at [facility], [address], [place], [place] [address]  . Alcohol, Ethyl (B) 07/04/2022 <10  <10 mg/dL Final   Comment: (NOTE) Lowest detectable limit for serum alcohol is 10 mg/dL.  For medical purposes only. Performed at [facility], [address] [place], [place] [address]   . WBC 07/04/2022 7.0  4.0 - 10.5 K/uL Final  . RBC 07/04/2022 4.18  3.87 - 5.11 MIL/uL Final  . Hemoglobin 07/04/2022 12.8  12.0 - 15.0 g/dL Final  . HCT 24/58/0998 39.1  36.0 - 46.0 % Final  . MCV 07/04/2022 93.5  80.0 - 100.0 fL Final  . [facility] 07/04/2022  30.6  26.0 - 34.0 pg Final  . MCHC 07/04/2022 32.7  30.0 - 36.0 g/dL Final  . RDW 16/07/9603 12.9  11.5 - 15.5 % Final  . Platelets 07/04/2022 243  150 - 400 K/uL Final  . nRBC 07/04/2022 0.0  0.0 - 0.2 % Final  . Neutrophils Relative % 07/04/2022 43  % Final  . Neutro Abs 07/04/2022 3.0  1.7 - 7.7 K/uL Final  . Lymphocytes Relative 07/04/2022 50  % Final  . Lymphs Abs 07/04/2022 3.5  0.7 - 4.0 K/uL Final  . Monocytes Relative 07/04/2022 7  % Final  . Monocytes Absolute 07/04/2022 0.5  0.1 - 1.0 K/uL Final  .  Eosinophils Relative 07/04/2022 0  % Final  . Eosinophils Absolute 07/04/2022 0.0  0.0 - 0.5 K/uL Final  . Basophils Relative 07/04/2022 0  % Final  . Basophils Absolute 07/04/2022 0.0  0.0 - 0.1 K/uL Final  . Immature Granulocytes 07/04/2022 0  % Final  . Abs Immature Granulocytes 07/04/2022 0.01  0.00 - 0.07 K/uL Final   Performed at [facility], [address], [place], [place] [address]  . I-stat hCG, quantitative 07/04/2022 <5.0  <5 mIU/mL Final  . Comment 3 07/04/2022          Final   Comment:   GEST. AGE      CONC.  (mIU/mL)   <=1 WEEK        5 - 50     2 WEEKS       50 - 500     3 WEEKS       100 - 10,000     4 WEEKS     1,000 - 30,000        FEMALE AND NON-PREGNANT FEMALE:     LESS THAN 5 mIU/mL   Admission on 06/14/2022, Discharged on 06/14/2022  Component Date Value Ref Range Status  . Color, UA 06/14/2022 yellow  yellow Final  . Clarity, UA 06/14/2022 cloudy (A)  clear Final  . Glucose, UA 06/14/2022 negative  negative mg/dL Final  . Bilirubin, UA 06/14/2022 negative  negative Final  . Ketones, POC UA 06/14/2022 negative  negative mg/dL Final  . Spec Grav, UA 06/14/2022 1.025  1.010 - 1.025 Final  . Blood, UA 06/14/2022 negative  negative Final  . pH, UA 06/14/2022 7.5  5.0 - 8.0 Final  . Protein Ur, POC 06/14/2022 =30 (A)  negative mg/dL Final  . Urobilinogen, UA 06/14/2022 0.2  0.2 or 1.0 E.U./dL Final  . Nitrite, UA 11/91/4782 Negative  Negative Final  . Leukocytes, UA 06/14/2022 Negative  Negative Final  . Preg Test, Ur 06/14/2022 Negative  Negative Final  . Specimen Description 06/14/2022 URINE, CLEAN CATCH   Final  . Special Requests 06/14/2022    Final                   Value:NONE Performed at [facility], [address], [place], [place] [address]   . Culture 06/14/2022 MULTIPLE SPECIES PRESENT, SUGGEST RECOLLECTION (A)   Final  . Report Status 06/14/2022 06/16/2022 FINAL   Final  Admission on 06/07/2022, Discharged on 06/10/2022   Component Date Value Ref Range Status  . SARS Coronavirus 2 by RT PCR 06/07/2022 NEGATIVE  NEGATIVE Final   Comment: (NOTE) SARS-CoV-2 target nucleic acids are NOT DETECTED.  The SARS-CoV-2 RNA is generally detectable in upper respiratory specimens during the acute phase of infection. The lowest concentration of SARS-CoV-2 viral copies this  assay can detect is 138 copies/mL. A negative result does not preclude SARS-Cov-2 infection and should not be used as the sole basis for treatment or other patient management decisions. A negative result may occur with  improper specimen collection/handling, submission of specimen other than nasopharyngeal swab, presence of viral mutation(s) within the areas targeted by this assay, and inadequate number of viral copies(<138 copies/mL). A negative result must be combined with clinical observations, patient history, and epidemiological information. The expected result is Negative.  Fact Sheet for Patients:  BloggerCourse.com  Fact Sheet for Healthcare Providers:  SeriousBroker.it  This test is no                          t yet approved or cleared by the [place] FDA and  has been authorized for detection and/or diagnosis of SARS-CoV-2 by FDA under an Emergency Use Authorization (EUA). This EUA will remain  in effect (meaning this test can be used) for the duration of the COVID-19 declaration under Section 564(b)(1) of the Act, 21 U.S.C.section 360bbb-3(b)(1), unless the authorization is terminated  or revoked sooner.      . Influenza A by PCR 06/07/2022 NEGATIVE  NEGATIVE Final  . Influenza B by PCR 06/07/2022 NEGATIVE  NEGATIVE Final   Comment: (NOTE) The Xpert Xpress SARS-CoV-2/FLU/RSV plus assay is intended as an aid in the diagnosis of influenza from Nasopharyngeal swab specimens and should not be used as a sole basis for treatment. Nasal washings and aspirates are unacceptable for  Xpert Xpress SARS-CoV-2/FLU/RSV testing.  Fact Sheet for Patients: BloggerCourse.com  Fact Sheet for Healthcare Providers: SeriousBroker.it  This test is not yet approved or cleared by the [place] FDA and has been authorized for detection and/or diagnosis of SARS-CoV-2 by FDA under an Emergency Use Authorization (EUA). This EUA will remain in effect (meaning this test can be used) for the duration of the COVID-19 declaration under Section 564(b)(1) of the Act, 21 U.S.C. section 360bbb-3(b)(1), unless the authorization is terminated or revoked.  Performed at [facility], [address] [place], [place] [address]   . WBC 06/07/2022 5.7  4.0 - 10.5 K/uL Final  . RBC 06/07/2022 4.39  3.87 - 5.11 MIL/uL Final  . Hemoglobin 06/07/2022 13.2  12.0 - 15.0 g/dL Final  . HCT 28/78/6767 40.4  36.0 - 46.0 % Final  . MCV 06/07/2022 92.0  80.0 - 100.0 fL Final  . MCH 06/07/2022 30.1  26.0 - 34.0 pg Final  . MCHC 06/07/2022 32.7  30.0 - 36.0 g/dL Final  . RDW 20/94/7096 12.4  11.5 - 15.5 % Final  . Platelets 06/07/2022 308  150 - 400 K/uL Final  . nRBC 06/07/2022 0.0  0.0 - 0.2 % Final  . Neutrophils Relative % 06/07/2022 42  % Final  . Neutro Abs 06/07/2022 2.4  1.7 - 7.7 K/uL Final  . Lymphocytes Relative 06/07/2022 54  % Final  . Lymphs Abs 06/07/2022 3.1  0.7 - 4.0 K/uL Final  . Monocytes Relative 06/07/2022 4  % Final  . Monocytes Absolute 06/07/2022 0.2  0.1 - 1.0 K/uL Final  . Eosinophils Relative 06/07/2022 0  % Final  . Eosinophils Absolute 06/07/2022 0.0  0.0 - 0.5 K/uL Final  . Basophils Relative 06/07/2022 0  % Final  . Basophils Absolute 06/07/2022 0.0  0.0 - 0.1 K/uL Final  . Immature Granulocytes 06/07/2022 0  % Final  . Abs Immature Granulocytes 06/07/2022 0.01  0.00 -  0.07 K/uL Final   Performed at [facility], [address] [place], [place] [address]  . Sodium 06/07/2022 139  135 - 145 mmol/L Final   . Potassium 06/07/2022 4.0  3.5 - 5.1 mmol/L Final  . Chloride 06/07/2022 104  98 - 111 mmol/L Final  . CO2 06/07/2022 28  22 - 32 mmol/L Final  . Glucose, Bld 06/07/2022 104 (H)  70 - 99 mg/dL Final   Glucose reference range applies only to samples taken after fasting for at least 8 hours.  . BUN 06/07/2022 8  6 - 20 mg/dL Final  . Creatinine, Ser 06/07/2022 0.84  0.44 - 1.00 mg/dL Final  . Calcium 60/45/4098 9.1  8.9 - 10.3 mg/dL Final  . Total Protein 06/07/2022 6.9  6.5 - 8.1 g/dL Final  . Albumin 11/91/4782 3.7  3.5 - 5.0 g/dL Final  . AST 95/62/1308 19  15 - 41 U/L Final  . ALT 06/07/2022 22  0 - 44 U/L Final  . Alkaline Phosphatase 06/07/2022 54  38 - 126 U/L Final  . Total Bilirubin 06/07/2022 0.4  0.3 - 1.2 mg/dL Final  . GFR, Estimated 06/07/2022 >60  >60 mL/min Final   Comment: (NOTE) Calculated using the CKD-EPI Creatinine Equation (2021)   . Anion gap 06/07/2022 7  5 - 15 Final   Performed at [facility], [address] [place], [place] [address]  . Hgb A1c MFr Bld 06/07/2022 5.0  4.8 - 5.6 % Final   Comment: (NOTE) Pre diabetes:          5.7%-6.4%  Diabetes:              >6.4%  Glycemic control for   <7.0% adults with diabetes   . Mean Plasma Glucose 06/07/2022 96.8  mg/dL Final   Performed at [facility], [address], [place], [place] [address]  . TSH 06/07/2022 1.620  0.350 - 4.500 uIU/mL Final   Comment: Performed by a 3rd Generation assay with a functional sensitivity of <=0.01 uIU/mL. Performed at [facility], [address], [place], [place] [address]   . RPR Ser Ql 06/07/2022 NON REACTIVE  NON REACTIVE Final   Performed at [facility], [address], [place], [place] [address]  . Color, Urine 06/07/2022 YELLOW  YELLOW Final  . APPearance 06/07/2022 HAZY (A)  CLEAR Final  . Specific Gravity, Urine 06/07/2022 1.018  1.005 - 1.030 Final  . pH 06/07/2022 7.0  5.0 - 8.0 Final  . Glucose, UA 06/07/2022 NEGATIVE  NEGATIVE mg/dL  Final  . Hgb urine dipstick 06/07/2022 NEGATIVE  NEGATIVE Final  . Bilirubin Urine 06/07/2022 NEGATIVE  NEGATIVE Final  . Ketones, ur 06/07/2022 NEGATIVE  NEGATIVE mg/dL Final  . Protein, ur 08/10/2535 NEGATIVE  NEGATIVE mg/dL Final  . Nitrite 64/40/3474 NEGATIVE  NEGATIVE Final  . Glori Luis 06/07/2022 NEGATIVE  NEGATIVE Final   Performed at [facility], [address] [place], [place] [address]  . Cholesterol 06/07/2022 164  0 - 200 mg/dL Final  . Triglycerides 06/07/2022 158 (H)  <150 mg/dL Final  . HDL 38/75/6433 44  >40 mg/dL Final  . Total CHOL/HDL Ratio 06/07/2022 3.7  RATIO Final  . VLDL 06/07/2022 32  0 - 40 mg/dL Final  . LDL Cholesterol 06/07/2022 88  0 - 99 mg/dL Final   Comment:        Total Cholesterol/HDL:CHD Risk Coronary Heart Disease Risk Table  Men   Women  1/2 Average Risk   3.4   3.3  Average Risk       5.0   4.4  2 X Average Risk   9.6   7.1  3 X Average Risk  23.4   11.0        Use the calculated Patient Ratio above and the CHD Risk Table to determine the patient's CHD Risk.        ATP III CLASSIFICATION (LDL):  <100     mg/dL   Optimal  161-096  mg/dL   Near or Above                    Optimal  130-159  mg/dL   Borderline  045-409  mg/dL   High  >811     mg/dL   Very High Performed at [facility], [address], [place], [place] [address]   . HIV Screen 4th Generation wRfx 06/07/2022 Non Reactive  Non Reactive Final   Performed at [facility], [address] [place], [place] [address]  . SARSCOV2ONAVIRUS 2 AG 06/07/2022 NEGATIVE  NEGATIVE Final   Comment: (NOTE) SARS-CoV-2 antigen NOT DETECTED.   Negative results are presumptive.  Negative results do not preclude SARS-CoV-2 infection and should not be used as the sole basis for treatment or other patient management decisions, including infection  control decisions, particularly in the presence of clinical signs and  symptoms consistent with COVID-19,  or in those who have been in contact with the virus.  Negative results must be combined with clinical observations, patient history, and epidemiological information. The expected result is Negative.  Fact Sheet for Patients: https://www.jennings-kim.com/  Fact Sheet for Healthcare Providers: https://alexander-rogers.biz/  This test is not yet approved or cleared by the [place] FDA and  has been authorized for detection and/or diagnosis of SARS-CoV-2 by FDA under an Emergency Use Authorization (EUA).  This EUA will remain in effect (meaning this test can be used) for the duration of  the COV                          ID-19 declaration under Section 564(b)(1) of the Act, 21 U.S.C. section 360bbb-3(b)(1), unless the authorization is terminated or revoked sooner.    Marland Kitchen POC Amphetamine UR 06/07/2022 None Detected  NONE DETECTED (Cut Off Level 1000 ng/mL) Final  . POC Secobarbital (BAR) 06/07/2022 None Detected  NONE DETECTED (Cut Off Level 300 ng/mL) Final  . POC Buprenorphine (BUP) 06/07/2022 None Detected  NONE DETECTED (Cut Off Level 10 ng/mL) Final  . POC Oxazepam (BZO) 06/07/2022 None Detected  NONE DETECTED (Cut Off Level 300 ng/mL) Final  . POC Cocaine UR 06/07/2022 None Detected  NONE DETECTED (Cut Off Level 300 ng/mL) Final  . POC Methamphetamine UR 06/07/2022 None Detected  NONE DETECTED (Cut Off Level 1000 ng/mL) Final  . POC Morphine 06/07/2022 None Detected  NONE DETECTED (Cut Off Level 300 ng/mL) Final  . POC Methadone UR 06/07/2022 None Detected  NONE DETECTED (Cut Off Level 300 ng/mL) Final  . POC Oxycodone UR 06/07/2022 None Detected  NONE DETECTED (Cut Off Level 100 ng/mL) Final  . POC Marijuana UR 06/07/2022 None Detected  NONE DETECTED (Cut Off Level 50 ng/mL) Final  . Preg Test, Ur 06/08/2022 NEGATIVE  NEGATIVE Final   Comment:        THE SENSITIVITY OF THIS METHODOLOGY IS >20 mIU/mL. Performed at [facility]  [facility], [address],  [place], [place] [address]   . Preg Test, Ur 06/08/2022 NEGATIVE  NEGATIVE Final   Comment:        THE SENSITIVITY OF THIS METHODOLOGY IS >24 mIU/mL     Blood Alcohol level:  Lab Results  Component Value Date   ETH <10 07/04/2022   ETH <10 02/07/2022    Metabolic Disorder Labs: Lab Results  Component Value Date   HGBA1C 5.0 06/07/2022   MPG 96.8 06/07/2022   MPG 96.8 02/07/2022   No results found for: "PROLACTIN" Lab Results  Component Value Date   CHOL 181 07/25/2022   TRIG 118 07/25/2022   HDL 45 07/25/2022   CHOLHDL 4.0 07/25/2022   VLDL 24 07/25/2022   LDLCALC 112 (H) 07/25/2022   LDLCALC 88 06/07/2022    Therapeutic Lab Levels: No results found for: "LITHIUM" No results found for: "VALPROATE" No results found for: "CBMZ"  Physical Findings   GAD-7    Flowsheet Row Office Visit from 12/17/2021 in [facility]  Total GAD-7 Score 17      PHQ2-9    Flowsheet Row ED from 06/07/2022 in [facility] Office Visit from 12/17/2021 in [facility]  PHQ-2 Total Score 1 2  PHQ-9 Total Score 2 8      Flowsheet Row ED from 07/25/2022 in [facility] ED from 07[facility]04/2022 in [facility] from 06/14/2022 in [facility]   C-SSRS RISK CATEGORY High Risk No Risk No Risk        Musculoskeletal  Strength & Muscle Tone: within normal limits Gait & Station: normal Patient leans: N/A  Psychiatric Specialty Exam  Presentation  General Appearance:  Appropriate for Environment; Casual  Eye Contact: Good  Speech: Clear and Coherent; Normal Rate  Speech Volume: Normal  Handedness: Right   Mood and Affect  Mood: Euthymic  Affect: Congruent; Appropriate   Thought Process  Thought Processes: Coherent; Linear  Descriptions of Associations:Intact  Orientation:Full (Time, Place and  Person)  Thought Content:Logical  Diagnosis of Schizophrenia or Schizoaffective disorder in past: Yes  Duration of Psychotic Symptoms: Less than six months   Hallucinations:Hallucinations: None    Ideas of Reference:None  Suicidal Thoughts:Suicidal Thoughts: No    Homicidal Thoughts:Homicidal Thoughts: No     Sensorium  Memory: Immediate Fair; Recent Fair; Remote Poor  Judgment: Intact  Insight: Present   Executive Functions  Concentration: Good  Attention Span: Good  Recall: Fair  Fund of Knowledge: Fair  Language: Fair   Psychomotor Activity  Psychomotor Activity: Psychomotor Activity: Normal     Assets  Assets: Communication Skills; Desire for Improvement   Sleep  Sleep: Sleep: Good     Physical Exam  Physical Exam Vitals reviewed.  Constitutional:      Appearance: She is obese.  HENT:     Head: Normocephalic and atraumatic.     Mouth/Throat:     Mouth: Mucous membranes are moist.     Pharynx: Oropharynx is clear.  Pulmonary:     Effort: Pulmonary effort is normal.  Skin:    General: Skin is warm and dry.  Neurological:     General: No focal deficit present.     Mental Status: She is alert and oriented to person, place, and time.   Review of Systems  Cardiovascular:  Negative for palpitations.  Gastrointestinal:  Negative for abdominal pain.  Genitourinary: Negative.   Musculoskeletal:  Positive for myalgias.  Neurological:  Negative for dizziness and headaches.   Blood pressure 112/61, pulse (!) 106, temperature 98.2 F (36.8 C), temperature source Oral, resp. rate 16, SpO2 100 %. There is no height or weight on file to calculate BMI.  Treatment Plan Summary: Daily contact with patient to assess and evaluate symptoms and progress in treatment and Medication management  Continue with current treatment plan as follows:  Continue Trileptal 300 mg p.o. twice daily Continue Zoloft 150 mg p.o. daily Continue Seroquel  400 mg p.o. twice daily Continue trazodone 50 mg p.o. nightly Start metformin 500 mg daily to prevent antipsychotic induced weight gain  Hypothyroidism: Continue levothyroxine 100 mcg daily  Nicotine: Continue 14 mg transdermal patch  Constipation Miralax packet once  See chart for agitation protocols   CSW to continue working on discharge disposition  Park Pope, MD 09/12/2022 8:28 AM

## 2022-09-13 NOTE — ED Provider Notes (Signed)
Behavioral Health Progress Note  Date and Time: 09/12/2022 8:18 AM Name: Nicole MassonLatasha [name] MRN:  [id number]  Subjective: Nicole [name] is awaiting placement via social work disposition.  She is awake, alert and oriented x 3.  Denying suicidal or homicidal ideations.  Denies auditory visual hallucinations. Patient has been medication compliant while on the unit.  No overt behaviors noted.  Reports a good appetite.  States she is resting well throughout the night.  Support and encouragement, reassurance was provided.    Patient completed the [place]-3 Adaptive Behavior Scales Assessment. Please see note from this writer detailing the results.   Per charted history; " The transition of care team is working to establish placement. 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  Per admission assessment note: " Nicole [name] is a 31 y.o. female, with PMH of SCZ unspecified, MDD with psychosis, who [facility] via [facility] after a verbal altercation with Nicole SeniorJosephine [name] (not patient's legal guardian) for transient SI. [facility] concerns this year.Patient has been dismissed from previous group home due to threatening SI and HI, then leaving the group home.   Legal Guardian: Mom Nicole [name](Nicole [name]) transitioning to be Dad Nicole [name](Nicole [name]) Point of contact: Dad Nicole [name](Nicole [name])"   Diagnosis:  Final diagnoses:  Severe episode of recurrent major depressive disorder, with psychotic features (HCC)    Total Time spent with patient: 2 hours  Past Psychiatric History:  Past Medical History:  Past Medical History:  Diagnosis Date   Anxiety    Depression    Hypothyroidism 08/07/2022   Tobacco use disorder 08/07/2022    Past Surgical History:  Procedure Laterality Date   WISDOM TOOTH  EXTRACTION Bilateral 2020   Family History:  Family History  Problem Relation Age of Onset   Hypertension Father    Diabetes Father    Family Psychiatric  History:  Social History:  Social History   Substance and Sexual Activity  Alcohol Use Never     Social History   Substance and Sexual Activity  Drug Use Never    Social History   Socioeconomic History   Marital status: Single    Spouse name: Not on file   Number of children: Not on file   Years of education: Not on file   Highest education level: Not on file  Occupational History   Not on file  Tobacco Use   Smoking status: Every Day    Types: Cigarettes   Smokeless tobacco: Never  Vaping Use   Vaping Use: Some days  Substance and Sexual Activity   Alcohol use: Never   Drug use: Never   Sexual activity: Yes    Partners: Female    Birth control/protection: Implant  Other Topics Concern   Not on file  Social History Narrative   Not on file   Social Determinants of Health   Financial Resource Strain: Not on file  Food Insecurity: Not on file  Transportation Needs: Not on file  Physical Activity: Not on file  Stress: Not on file  Social Connections: Not on file   SDOH:  SDOH Screenings   Depression (PHQ2-9): Low Risk  (06/10/2022)  Tobacco Use: High Risk (09/01/2022)   Additional Social History:    Pain Medications: SEE MAR Prescriptions: SEE MAR Over the Counter: SEE MAR History  of alcohol / drug use?: Yes Longest period of sobriety (when/how long): N/A Negative Consequences of Use:  (NONE) Withdrawal Symptoms: None Name of Substance 1: ETOH IN PAST--PT CURRENTLY DENIES 1 - Frequency: SOCIAL USE ETOH IN PAST 1 - Method of Aquiring: LEGAL 1- Route of Use: ORAL DRINK Name of Substance 2: NICOTINE-VAPE AND CIGARETTES 2 - Amount (size/oz): LESS THAN ONE PACK 2 - Frequency: DAILY 2 - Method of Aquiring: LEGAL 2 - Route of Substance Use: ORAL SMOKE                Sleep:  Good  Appetite:  Good  Current Medications:  Current Facility-Administered Medications  Medication Dose Route Frequency Provider Last Rate Last Admin   acetaminophen (TYLENOL) tablet 650 mg  650 mg Oral Q6H PRN Onuoha, Chinwendu V, NP   650 mg at 09/12/22 2105   alum & mag hydroxide-simeth (MAALOX/MYLANTA) 200-200-20 MG/5ML suspension 30 mL  30 mL Oral Q4H PRN Onuoha, Chinwendu V, NP   30 mL at 07/27/22 2151   ARIPiprazole ER (ABILIFY MAINTENA) injection 400 mg  400 mg Intramuscular Q28 days Princess Bruins, DO   400 mg at 08/30/22 1338   docusate sodium (COLACE) capsule 100 mg  100 mg Oral Daily [name], MD   100 mg at 09/12/22 0949   hydrOXYzine (ATARAX) tablet 25 mg  25 mg Oral TID PRN Onuoha, Chinwendu V, NP   25 mg at 09/11/22 2120   ibuprofen (ADVIL) tablet 400 mg  400 mg Oral Q6H PRN Onuoha, Chinwendu V, NP   400 mg at 09/06/22 1314   levothyroxine (SYNTHROID) tablet 100 mcg  100 mcg Oral Q0600 Princess Bruins, DO   100 mcg at 09/13/22 0636   nicotine (NICODERM CQ - dosed in mg/24 hours) patch 14 mg  14 mg Transdermal Q0600 Onuoha, Chinwendu V, NP   14 mg at 09/12/22 0948   ondansetron (ZOFRAN-ODT) disintegrating tablet 4 mg  4 mg Oral Q8H PRN Sindy Guadeloupe, NP   4 mg at 08/30/22 2122   Oxcarbazepine (TRILEPTAL) tablet 300 mg  300 mg Oral BID Onuoha, Chinwendu V, NP   300 mg at 09/12/22 2105   pantoprazole (PROTONIX) EC tablet 20 mg  20 mg Oral Daily Princess Bruins, DO   20 mg at 09/12/22 0949   QUEtiapine (SEROQUEL) tablet 400 mg  400 mg Oral BID Onuoha, Chinwendu V, NP   400 mg at 09/12/22 2105   sertraline (ZOLOFT) tablet 150 mg  150 mg Oral Daily Onuoha, Chinwendu V, NP   150 mg at 09/12/22 0949   traZODone (DESYREL) tablet 50 mg  50 mg Oral QHS PRN Onuoha, Chinwendu V, NP   50 mg at 09/12/22 2105   valACYclovir (VALTREX) tablet 500 mg  500 mg Oral Daily Princess Bruins, DO   500 mg at 09/12/22 0949   ziprasidone (GEODON) injection 20 mg  20 mg Intramuscular Q12H PRN Onuoha,  Chinwendu V, NP       Current Outpatient Medications  Medication Sig Dispense Refill   ABILIFY MAINTENA 400 MG PRSY prefilled syringe 400 mg every 28 (twenty-eight) days.     cetirizine (ZYRTEC) 10 MG tablet Take 10 mg by mouth daily.     cyclobenzaprine (FLEXERIL) 10 MG tablet Take 1 tablet (10 mg total) by mouth 2 (two) times daily as needed for muscle spasms. 20 tablet 0   fluticasone (FLONASE) 50 MCG/ACT nasal spray Place 1 spray into both nostrils daily.     meloxicam (MOBIC) 15 MG tablet  Take 15 mg by mouth daily.     Oxcarbazepine (TRILEPTAL) 300 MG tablet Take 1 tablet (300 mg total) by mouth 2 (two) times daily. 60 tablet 0   QUEtiapine (SEROQUEL) 400 MG tablet Take 1 tablet (400 mg total) by mouth 2 (two) times daily. 60 tablet 0   sertraline (ZOLOFT) 50 MG tablet Take 3 tablets (150 mg total) by mouth in the morning. 90 tablet 0   traZODone (DESYREL) 100 MG tablet Take 1 tablet (100 mg total) by mouth at bedtime. 30 tablet 0   valACYclovir (VALTREX) 500 MG tablet Take 500 mg by mouth daily.      Labs  Lab Results:  Admission on 07/25/2022  Component Date Value Ref Range Status   SARS Coronavirus 2 by RT PCR 07/25/2022 NEGATIVE  NEGATIVE Final   Comment: (NOTE) SARS-CoV-2 target nucleic acids are NOT DETECTED.  The SARS-CoV-2 RNA is generally detectable in upper respiratory specimens during the acute phase of infection. The lowest concentration of SARS-CoV-2 viral copies this assay can detect is 138 copies/mL. A negative result does not preclude SARS-Cov-2 infection and should not be used as the sole basis for treatment or other patient management decisions. A negative result may occur with  improper specimen collection/handling, submission of specimen other than nasopharyngeal swab, presence of viral mutation(s) within the areas targeted by this assay, and inadequate number of viral copies(<138 copies/mL). A negative result must be combined with clinical observations,  patient history, and epidemiological information. The expected result is Negative.  Fact Sheet for Patients:  BloggerCourse.com  Fact Sheet for Healthcare Providers:  SeriousBroker.it  This test is no                          t yet approved or cleared by the [place] FDA and  has been authorized for detection and/or diagnosis of SARS-CoV-2 by FDA under an Emergency Use Authorization (EUA). This EUA will remain  in effect (meaning this test can be used) for the duration of the COVID-19 declaration under Section 564(b)(1) of the Act, 21 U.S.C.section 360bbb-3(b)(1), unless the authorization is terminated  or revoked sooner.       Influenza A by PCR 07/25/2022 NEGATIVE  NEGATIVE Final   Influenza B by PCR 07/25/2022 NEGATIVE  NEGATIVE Final   Comment: (NOTE) The Xpert Xpress SARS-CoV-2/FLU/RSV plus assay is intended as an aid in the diagnosis of influenza from Nasopharyngeal swab specimens and should not be used as a sole basis for treatment. Nasal washings and aspirates are unacceptable for Xpert Xpress SARS-CoV-2/FLU/RSV testing.  Fact Sheet for Patients: BloggerCourse.com  Fact Sheet for Healthcare Providers: SeriousBroker.it  This test is not yet approved or cleared by the [place] FDA and has been authorized for detection and/or diagnosis of SARS-CoV-2 by FDA under an Emergency Use Authorization (EUA). This EUA will remain in effect (meaning this test can be used) for the duration of the COVID-19 declaration under Section 564(b)(1) of the Act, 21 U.S.C. section 360bbb-3(b)(1), unless the authorization is terminated or revoked.  Performed at [facility], [address], [place], [place] [address]    WBC 07/25/2022 8.3  4.0 - 10.5 K/uL Final   RBC 07/25/2022 4.44  3.87 - 5.11 MIL/uL Final   Hemoglobin 07/25/2022 13.7  12.0 - 15.0 g/dL Final   HCT  52/84/1324 40.2  36.0 - 46.0 % Final   MCV 07/25/2022 90.5  80.0 - 100.0 fL Final   [facility] 07/25/2022  30.9  26.0 - 34.0 pg Final   MCHC 07/25/2022 34.1  30.0 - 36.0 g/dL Final   RDW 16/07/9603 12.2  11.5 - 15.5 % Final   Platelets 07/25/2022 248  150 - 400 K/uL Final   nRBC 07/25/2022 0.0  0.0 - 0.2 % Final   Neutrophils Relative % 07/25/2022 43  % Final   Neutro Abs 07/25/2022 3.6  1.7 - 7.7 K/uL Final   Lymphocytes Relative 07/25/2022 52  % Final   Lymphs Abs 07/25/2022 4.2 (H)  0.7 - 4.0 K/uL Final   Monocytes Relative 07/25/2022 5  % Final   Monocytes Absolute 07/25/2022 0.4  0.1 - 1.0 K/uL Final   Eosinophils Relative 07/25/2022 0  % Final   Eosinophils Absolute 07/25/2022 0.0  0.0 - 0.5 K/uL Final   Basophils Relative 07/25/2022 0  % Final   Basophils Absolute 07/25/2022 0.0  0.0 - 0.1 K/uL Final   Immature Granulocytes 07/25/2022 0  % Final   Abs Immature Granulocytes 07/25/2022 0.02  0.00 - 0.07 K/uL Final   Performed at [facility], [address], [place], [place] [address]   Sodium 07/25/2022 138  135 - 145 mmol/L Final   Potassium 07/25/2022 4.0  3.5 - 5.1 mmol/L Final   Chloride 07/25/2022 104  98 - 111 mmol/L Final   CO2 07/25/2022 29  22 - 32 mmol/L Final   Glucose, Bld 07/25/2022 83  70 - 99 mg/dL Final   Glucose reference range applies only to samples taken after fasting for at least 8 hours.   BUN 07/25/2022 11  6 - 20 mg/dL Final   Creatinine, Ser 07/25/2022 0.97  0.44 - 1.00 mg/dL Final   Calcium 11/91/4782 9.2  8.9 - 10.3 mg/dL Final   Total Protein 95/62/1308 7.0  6.5 - 8.1 g/dL Final   Albumin 65/78/4696 3.8  3.5 - 5.0 g/dL Final   AST 29/52/8413 18  15 - 41 U/L Final   ALT 07/25/2022 22  0 - 44 U/L Final   Alkaline Phosphatase 07/25/2022 64  38 - 126 U/L Final   Total Bilirubin 07/25/2022 0.2 (L)  0.3 - 1.2 mg/dL Final   GFR, Estimated 07/25/2022 >60  >60 mL/min Final   Comment: (NOTE) Calculated using the CKD-EPI Creatinine Equation (2021)    Anion  gap 07/25/2022 5  5 - 15 Final   Performed at [facility], [address], [place], [place] [address]   POC Amphetamine UR 07/25/2022 None Detected  NONE DETECTED (Cut Off Level 1000 ng/mL) Preliminary   POC Secobarbital (BAR) 07/25/2022 None Detected  NONE DETECTED (Cut Off Level 300 ng/mL) Preliminary   POC Buprenorphine (BUP) 07/25/2022 None Detected  NONE DETECTED (Cut Off Level 10 ng/mL) Preliminary   POC Oxazepam (BZO) 07/25/2022 None Detected  NONE DETECTED (Cut Off Level 300 ng/mL) Preliminary   POC Cocaine UR 07/25/2022 None Detected  NONE DETECTED (Cut Off Level 300 ng/mL) Preliminary   POC Methamphetamine UR 07/25/2022 None Detected  NONE DETECTED (Cut Off Level 1000 ng/mL) Preliminary   POC Morphine 07/25/2022 None Detected  NONE DETECTED (Cut Off Level 300 ng/mL) Preliminary   POC Methadone UR 07/25/2022 None Detected  NONE DETECTED (Cut Off Level 300 ng/mL) Preliminary   POC Oxycodone UR 07/25/2022 None Detected  NONE DETECTED (Cut Off Level 100 ng/mL) Preliminary   POC Marijuana UR 07/25/2022 None Detected  NONE DETECTED (Cut Off Level 50 ng/mL) Preliminary   SARSCOV2ONAVIRUS 2 AG 07/25/2022 NEGATIVE  NEGATIVE Final  Comment: (NOTE) SARS-CoV-2 antigen NOT DETECTED.   Negative results are presumptive.  Negative results do not preclude SARS-CoV-2 infection and should not be used as the sole basis for treatment or other patient management decisions, including infection  control decisions, particularly in the presence of clinical signs and  symptoms consistent with COVID-19, or in those who have been in contact with the virus.  Negative results must be combined with clinical observations, patient history, and epidemiological information. The expected result is Negative.  Fact Sheet for Patients: https://www.jennings-kim.com/  Fact Sheet for Healthcare Providers: https://alexander-rogers.biz/  This test is not yet approved or cleared by the  [place] FDA and  has been authorized for detection and/or diagnosis of SARS-CoV-2 by FDA under an Emergency Use Authorization (EUA).  This EUA will remain in effect (meaning this test can be used) for the duration of  the COV                          ID-19 declaration under Section 564(b)(1) of the Act, 21 U.S.C. section 360bbb-3(b)(1), unless the authorization is terminated or revoked sooner.     Cholesterol 07/25/2022 181  0 - 200 mg/dL Final   Triglycerides 91/47/8295 118  <150 mg/dL Final   HDL 62/13/0865 45  >40 mg/dL Final   Total CHOL/HDL Ratio 07/25/2022 4.0  RATIO Final   VLDL 07/25/2022 24  0 - 40 mg/dL Final   LDL Cholesterol 07/25/2022 112 (H)  0 - 99 mg/dL Final   Comment:        Total Cholesterol/HDL:CHD Risk Coronary Heart Disease Risk Table                     Men   Women  1/2 Average Risk   3.4   3.3  Average Risk       5.0   4.4  2 X Average Risk   9.6   7.1  3 X Average Risk  23.4   11.0        Use the calculated Patient Ratio above and the CHD Risk Table to determine the patient's CHD Risk.        ATP III CLASSIFICATION (LDL):  <100     mg/dL   Optimal  784-696  mg/dL   Near or Above                    Optimal  130-159  mg/dL   Borderline  295-284  mg/dL   High  >132     mg/dL   Very High Performed at [facility], [address], [place], [place] [address]    TSH 07/25/2022 6.668 (H)  0.350 - 4.500 uIU/mL Final   Comment: Performed by a 3rd Generation assay with a functional sensitivity of <=0.01 uIU/mL. Performed at [facility], [address], [place], [place] [address]    Glucose-Capillary 07/26/2022 104 (H)  70 - 99 mg/dL Final   Glucose reference range applies only to samples taken after fasting for at least 8 hours.   T3, Free 07/31/2022 2.3  2.0 - 4.4 pg/mL Final   Comment: (NOTE) Performed At: [facility] [address] [place], [place] [id number] Jolene Schimke MD QV:[id number]    Free T4 07/31/2022 0.60 (L)   0.61 - 1.12 ng/dL Final   Comment: (NOTE) Biotin ingestion may interfere with free T4 tests. If the results are inconsistent with the TSH level, previous test  results, or the clinical presentation, then consider biotin interference. If needed, order repeat testing after stopping biotin. Performed at [facility], [address] [place], [place] [address]    Glucose-Capillary 08/29/2022 100 (H)  70 - 99 mg/dL Final   Glucose reference range applies only to samples taken after fasting for at least 8 hours.   TSH 09/05/2022 0.793  0.350 - 4.500 uIU/mL Final   Comment: Performed by a 3rd Generation assay with a functional sensitivity of <=0.01 uIU/mL. Performed at [facility], [address], [place], [place] [address]    Free T4 09/05/2022 0.73  0.61 - 1.12 ng/dL Final   Comment: (NOTE) Biotin ingestion may interfere with free T4 tests. If the results are inconsistent with the TSH level, previous test results, or the clinical presentation, then consider biotin interference. If needed, order repeat testing after stopping biotin. Performed at [facility], [address], [place], [place] [address]    Preg Test, Ur 09/06/2022 Negative  Negative Final   Preg Test, Ur 09/06/2022 NEGATIVE  NEGATIVE Final   Comment:        THE SENSITIVITY OF THIS METHODOLOGY IS >24 mIU/mL    Preg Test, Ur 09/05/2022 NEGATIVE  NEGATIVE Final   Comment:        THE SENSITIVITY OF THIS METHODOLOGY IS >24 mIU/mL   Admission on 07/04/2022, Discharged on 07/05/2022  Component Date Value Ref Range Status   Sodium 07/04/2022 142  135 - 145 mmol/L Final   Potassium 07/04/2022 4.4  3.5 - 5.1 mmol/L Final   Chloride 07/04/2022 109  98 - 111 mmol/L Final   CO2 07/04/2022 26  22 - 32 mmol/L Final   Glucose, Bld 07/04/2022 96  70 - 99 mg/dL Final   Glucose reference range applies only to samples taken after fasting for at least 8 hours.   BUN 07/04/2022 15  6 - 20 mg/dL Final   Creatinine, Ser  07/04/2022 0.83  0.44 - 1.00 mg/dL Final   Calcium 91/47/8295 9.3  8.9 - 10.3 mg/dL Final   Total Protein 62/13/0865 7.2  6.5 - 8.1 g/dL Final   Albumin 78/46/9629 3.9  3.5 - 5.0 g/dL Final   AST 52/84/1324 23  15 - 41 U/L Final   ALT 07/04/2022 28  0 - 44 U/L Final   Alkaline Phosphatase 07/04/2022 77  38 - 126 U/L Final   Total Bilirubin 07/04/2022 0.4  0.3 - 1.2 mg/dL Final   GFR, Estimated 07/04/2022 >60  >60 mL/min Final   Comment: (NOTE) Calculated using the CKD-EPI Creatinine Equation (2021)    Anion gap 07/04/2022 7  5 - 15 Final   Performed at [facility], [address] [place], [place] [address]   Alcohol, Ethyl (B) 07/04/2022 <10  <10 mg/dL Final   Comment: (NOTE) Lowest detectable limit for serum alcohol is 10 mg/dL.  For medical purposes only. Performed at [facility], [address], [place], [place] [address]    WBC 07/04/2022 7.0  4.0 - 10.5 K/uL Final   RBC 07/04/2022 4.18  3.87 - 5.11 MIL/uL Final   Hemoglobin 07/04/2022 12.8  12.0 - 15.0 g/dL Final   HCT 64/40/3474 39.1  36.0 - 46.0 % Final   MCV 07/04/2022 93.5  80.0 - 100.0 fL Final   MCH 07/04/2022 30.6  26.0 - 34.0 pg Final   MCHC 07/04/2022 32.7  30.0 - 36.0 g/dL Final   RDW 25/95/6387 12.9  11.5 - 15.5 % Final   Platelets 07/04/2022 243  150 - 400 K/uL Final   nRBC 07/04/2022 0.0  0.0 - 0.2 % Final   Neutrophils Relative % 07/04/2022 43  % Final   Neutro Abs 07/04/2022 3.0  1.7 - 7.7 K/uL Final   Lymphocytes Relative 07/04/2022 50  % Final   Lymphs Abs 07/04/2022 3.5  0.7 - 4.0 K/uL Final   Monocytes Relative 07/04/2022 7  % Final   Monocytes Absolute 07/04/2022 0.5  0.1 - 1.0 K/uL Final   Eosinophils Relative 07/04/2022 0  % Final   Eosinophils Absolute 07/04/2022 0.0  0.0 - 0.5 K/uL Final   Basophils Relative 07/04/2022 0  % Final   Basophils Absolute 07/04/2022 0.0  0.0 - 0.1 K/uL Final   Immature Granulocytes 07/04/2022 0  % Final   Abs Immature  Granulocytes 07/04/2022 0.01  0.00 - 0.07 K/uL Final   Performed at [facility], [address], [place], [place] [address]   I-stat hCG, quantitative 07/04/2022 <5.0  <5 mIU/mL Final   Comment 3 07/04/2022          Final   Comment:   GEST. AGE      CONC.  (mIU/mL)   <=1 WEEK        5 - 50     2 WEEKS       50 - 500     3 WEEKS       100 - 10,000     4 WEEKS     1,000 - 30,000        FEMALE AND NON-PREGNANT FEMALE:     LESS THAN 5 mIU/mL   Admission on 06/14/2022, Discharged on 06/14/2022  Component Date Value Ref Range Status   Color, UA 06/14/2022 yellow  yellow Final   Clarity, UA 06/14/2022 cloudy (A)  clear Final   Glucose, UA 06/14/2022 negative  negative mg/dL Final   Bilirubin, UA 50/35/4656 negative  negative Final   Ketones, POC UA 06/14/2022 negative  negative mg/dL Final   Spec Grav, UA 81/27/5170 1.025  1.010 - 1.025 Final   Blood, UA 06/14/2022 negative  negative Final   pH, UA 06/14/2022 7.5  5.0 - 8.0 Final   Protein Ur, POC 06/14/2022 =30 (A)  negative mg/dL Final   Urobilinogen, UA 06/14/2022 0.2  0.2 or 1.0 E.U./dL Final   Nitrite, UA 01/74/9449 Negative  Negative Final   Leukocytes, UA 06/14/2022 Negative  Negative Final   Preg Test, Ur 06/14/2022 Negative  Negative Final   Specimen Description 06/14/2022 URINE, CLEAN CATCH   Final   Special Requests 06/14/2022    Final                   Value:NONE Performed at [facility], [address], [place], [place] [address]    Culture 06/14/2022 MULTIPLE SPECIES PRESENT, SUGGEST RECOLLECTION (A)   Final   Report Status 06/14/2022 06/16/2022 FINAL   Final  Admission on 06/07/2022, Discharged on 06/10/2022  Component Date Value Ref Range Status   SARS Coronavirus 2 by RT PCR 06/07/2022 NEGATIVE  NEGATIVE Final   Comment: (NOTE) SARS-CoV-2 target nucleic acids are NOT DETECTED.  The SARS-CoV-2 RNA is generally detectable in upper respiratory specimens during the acute phase of infection. The  lowest concentration of SARS-CoV-2 viral copies this assay can detect is 138 copies/mL. A negative result does not preclude SARS-Cov-2 infection and should not be used as the sole basis for  treatment or other patient management decisions. A negative result may occur with  improper specimen collection/handling, submission of specimen other than nasopharyngeal swab, presence of viral mutation(s) within the areas targeted by this assay, and inadequate number of viral copies(<138 copies/mL). A negative result must be combined with clinical observations, patient history, and epidemiological information. The expected result is Negative.  Fact Sheet for Patients:  BloggerCourse.com  Fact Sheet for Healthcare Providers:  SeriousBroker.it  This test is no                          t yet approved or cleared by the [place] FDA and  has been authorized for detection and/or diagnosis of SARS-CoV-2 by FDA under an Emergency Use Authorization (EUA). This EUA will remain  in effect (meaning this test can be used) for the duration of the COVID-19 declaration under Section 564(b)(1) of the Act, 21 U.S.C.section 360bbb-3(b)(1), unless the authorization is terminated  or revoked sooner.       Influenza A by PCR 06/07/2022 NEGATIVE  NEGATIVE Final   Influenza B by PCR 06/07/2022 NEGATIVE  NEGATIVE Final   Comment: (NOTE) The Xpert Xpress SARS-CoV-2/FLU/RSV plus assay is intended as an aid in the diagnosis of influenza from Nasopharyngeal swab specimens and should not be used as a sole basis for treatment. Nasal washings and aspirates are unacceptable for Xpert Xpress SARS-CoV-2/FLU/RSV testing.  Fact Sheet for Patients: BloggerCourse.com  Fact Sheet for Healthcare Providers: SeriousBroker.it  This test is not yet approved or cleared by the [place] FDA and has been authorized for  detection and/or diagnosis of SARS-CoV-2 by FDA under an Emergency Use Authorization (EUA). This EUA will remain in effect (meaning this test can be used) for the duration of the COVID-19 declaration under Section 564(b)(1) of the Act, 21 U.S.C. section 360bbb-3(b)(1), unless the authorization is terminated or revoked.  Performed at [facility], [address] [place], [place] [address]    WBC 06/07/2022 5.7  4.0 - 10.5 K/uL Final   RBC 06/07/2022 4.39  3.87 - 5.11 MIL/uL Final   Hemoglobin 06/07/2022 13.2  12.0 - 15.0 g/dL Final   HCT 63/84/6659 40.4  36.0 - 46.0 % Final   MCV 06/07/2022 92.0  80.0 - 100.0 fL Final   MCH 06/07/2022 30.1  26.0 - 34.0 pg Final   MCHC 06/07/2022 32.7  30.0 - 36.0 g/dL Final   RDW 93/57/0177 12.4  11.5 - 15.5 % Final   Platelets 06/07/2022 308  150 - 400 K/uL Final   nRBC 06/07/2022 0.0  0.0 - 0.2 % Final   Neutrophils Relative % 06/07/2022 42  % Final   Neutro Abs 06/07/2022 2.4  1.7 - 7.7 K/uL Final   Lymphocytes Relative 06/07/2022 54  % Final   Lymphs Abs 06/07/2022 3.1  0.7 - 4.0 K/uL Final   Monocytes Relative 06/07/2022 4  % Final   Monocytes Absolute 06/07/2022 0.2  0.1 - 1.0 K/uL Final   Eosinophils Relative 06/07/2022 0  % Final   Eosinophils Absolute 06/07/2022 0.0  0.0 - 0.5 K/uL Final   Basophils Relative 06/07/2022 0  % Final   Basophils Absolute 06/07/2022 0.0  0.0 - 0.1 K/uL Final   Immature Granulocytes 06/07/2022 0  % Final   Abs Immature Granulocytes 06/07/2022 0.01  0.00 - 0.07 K/uL Final   Performed at [facility], [address], [place], [place] [address]   Sodium 06/07/2022 139  135 - 145 mmol/L Final   Potassium 06/07/2022 4.0  3.5 - 5.1 mmol/L Final   Chloride 06/07/2022 104  98 - 111 mmol/L Final   CO2 06/07/2022 28  22 - 32 mmol/L Final   Glucose, Bld 06/07/2022 104 (H)  70 - 99 mg/dL Final   Glucose reference range applies only to samples taken after fasting for at least 8 hours.   BUN 06/07/2022 8  6 -  20 mg/dL Final   Creatinine, Ser 06/07/2022 0.84  0.44 - 1.00 mg/dL Final   Calcium 16/07/9603 9.1  8.9 - 10.3 mg/dL Final   Total Protein 54/06/8118 6.9  6.5 - 8.1 g/dL Final   Albumin 14/78/2956 3.7  3.5 - 5.0 g/dL Final   AST 21/30/8657 19  15 - 41 U/L Final   ALT 06/07/2022 22  0 - 44 U/L Final   Alkaline Phosphatase 06/07/2022 54  38 - 126 U/L Final   Total Bilirubin 06/07/2022 0.4  0.3 - 1.2 mg/dL Final   GFR, Estimated 06/07/2022 >60  >60 mL/min Final   Comment: (NOTE) Calculated using the CKD-EPI Creatinine Equation (2021)    Anion gap 06/07/2022 7  5 - 15 Final   Performed at [facility], [address], [place], [place] [address]   Hgb A1c MFr Bld 06/07/2022 5.0  4.8 - 5.6 % Final   Comment: (NOTE) Pre diabetes:          5.7%-6.4%  Diabetes:              >6.4%  Glycemic control for   <7.0% adults with diabetes    Mean Plasma Glucose 06/07/2022 96.8  mg/dL Final   Performed at [facility], [address] [place], [place] [address]   TSH 06/07/2022 1.620  0.350 - 4.500 uIU/mL Final   Comment: Performed by a 3rd Generation assay with a functional sensitivity of <=0.01 uIU/mL. Performed at [facility], [address] [place], [place] [address]    RPR Ser Ql 06/07/2022 NON REACTIVE  NON REACTIVE Final   Performed at [facility], [address] [place], [place] [address]   Color, Urine 06/07/2022 YELLOW  YELLOW Final   APPearance 06/07/2022 HAZY (A)  CLEAR Final   Specific Gravity, Urine 06/07/2022 1.018  1.005 - 1.030 Final   pH 06/07/2022 7.0  5.0 - 8.0 Final   Glucose, UA 06/07/2022 NEGATIVE  NEGATIVE mg/dL Final   Hgb urine dipstick 06/07/2022 NEGATIVE  NEGATIVE Final   Bilirubin Urine 06/07/2022 NEGATIVE  NEGATIVE Final   Ketones, ur 06/07/2022 NEGATIVE  NEGATIVE mg/dL Final   Protein, ur 72/53/6644 NEGATIVE  NEGATIVE mg/dL Final   Nitrite 03/47/4259 NEGATIVE  NEGATIVE Final   Leukocytes,Ua 06/07/2022 NEGATIVE  NEGATIVE Final    Performed at [facility], [address], [place], [place] [address]   Cholesterol 06/07/2022 164  0 - 200 mg/dL Final   Triglycerides 56/43/3295 158 (H)  <150 mg/dL Final   HDL 18/84/1660 44  >40 mg/dL Final   Total CHOL/HDL Ratio 06/07/2022 3.7  RATIO Final   VLDL 06/07/2022 32  0 - 40 mg/dL Final   LDL Cholesterol 06/07/2022 88  0 - 99 mg/dL Final   Comment:        Total Cholesterol/HDL:CHD Risk Coronary Heart Disease Risk Table                     Men   Women  1/2 Average Risk  3.4   3.3  Average Risk       5.0   4.4  2 X Average Risk   9.6   7.1  3 X Average Risk  23.4   11.0        Use the calculated Patient Ratio above and the CHD Risk Table to determine the patient's CHD Risk.        ATP III CLASSIFICATION (LDL):  <100     mg/dL   Optimal  161-096  mg/dL   Near or Above                    Optimal  130-159  mg/dL   Borderline  045-409  mg/dL   High  >811     mg/dL   Very High Performed at [facility], [address], [place], [place] [address]    HIV Screen 4th Generation wRfx 06/07/2022 Non Reactive  Non Reactive Final   Performed at [facility], [address], [place], [place] [address]   SARSCOV2ONAVIRUS 2 AG 06/07/2022 NEGATIVE  NEGATIVE Final   Comment: (NOTE) SARS-CoV-2 antigen NOT DETECTED.   Negative results are presumptive.  Negative results do not preclude SARS-CoV-2 infection and should not be used as the sole basis for treatment or other patient management decisions, including infection  control decisions, particularly in the presence of clinical signs and  symptoms consistent with COVID-19, or in those who have been in contact with the virus.  Negative results must be combined with clinical observations, patient history, and epidemiological information. The expected result is Negative.  Fact Sheet for Patients: https://www.jennings-kim.com/  Fact Sheet for Healthcare  Providers: https://alexander-rogers.biz/  This test is not yet approved or cleared by the [place] FDA and  has been authorized for detection and/or diagnosis of SARS-CoV-2 by FDA under an Emergency Use Authorization (EUA).  This EUA will remain in effect (meaning this test can be used) for the duration of  the COV                          ID-19 declaration under Section 564(b)(1) of the Act, 21 U.S.C. section 360bbb-3(b)(1), unless the authorization is terminated or revoked sooner.     POC Amphetamine UR 06/07/2022 None Detected  NONE DETECTED (Cut Off Level 1000 ng/mL) Final   POC Secobarbital (BAR) 06/07/2022 None Detected  NONE DETECTED (Cut Off Level 300 ng/mL) Final   POC Buprenorphine (BUP) 06/07/2022 None Detected  NONE DETECTED (Cut Off Level 10 ng/mL) Final   POC Oxazepam (BZO) 06/07/2022 None Detected  NONE DETECTED (Cut Off Level 300 ng/mL) Final   POC Cocaine UR 06/07/2022 None Detected  NONE DETECTED (Cut Off Level 300 ng/mL) Final   POC Methamphetamine UR 06/07/2022 None Detected  NONE DETECTED (Cut Off Level 1000 ng/mL) Final   POC Morphine 06/07/2022 None Detected  NONE DETECTED (Cut Off Level 300 ng/mL) Final   POC Methadone UR 06/07/2022 None Detected  NONE DETECTED (Cut Off Level 300 ng/mL) Final   POC Oxycodone UR 06/07/2022 None Detected  NONE DETECTED (Cut Off Level 100 ng/mL) Final   POC Marijuana UR 06/07/2022 None Detected  NONE DETECTED (Cut Off Level 50 ng/mL) Final   Preg Test, Ur 06/08/2022 NEGATIVE  NEGATIVE Final   Comment:        THE SENSITIVITY OF THIS METHODOLOGY IS >20 mIU/mL. Performed at [facility], [address] [place], [place] [address]  Preg Test, Ur 06/08/2022 NEGATIVE  NEGATIVE Final   Comment:        THE SENSITIVITY OF THIS METHODOLOGY IS >24 mIU/mL     Blood Alcohol level:  Lab Results  Component Value Date   ETH <10 07/04/2022   ETH <10 02/07/2022    Metabolic Disorder Labs: Lab Results  Component  Value Date   HGBA1C 5.0 06/07/2022   MPG 96.8 06/07/2022   MPG 96.8 02/07/2022   No results found for: "PROLACTIN" Lab Results  Component Value Date   CHOL 181 07/25/2022   TRIG 118 07/25/2022   HDL 45 07/25/2022   CHOLHDL 4.0 07/25/2022   VLDL 24 07/25/2022   LDLCALC 112 (H) 07/25/2022   LDLCALC 88 06/07/2022    Therapeutic Lab Levels: No results found for: "LITHIUM" No results found for: "VALPROATE" No results found for: "CBMZ"  Physical Findings   GAD-7    Flowsheet Row [facility]17/2021 [facility]  Total GAD-7 Score 17      PHQ2-9    Flowsheet Row ED from 06/07/2022 in [facility] Office Visit from 12/17/2021 [facility]  PHQ-2 Total Score 1 2  PHQ-9 Total Score 2 8      Flowsheet Row ED from 07/25/2022 in [facility] ED from 07/04/2022 in [facility] from [facility]14/2022 in [facility]   C-SSRS RISK CATEGORY High Risk No Risk No Risk        Musculoskeletal  Strength & Muscle Tone: within normal limits Gait & Station: normal Patient leans: N/A  Psychiatric Specialty Exam  Presentation  General Appearance:  Appropriate for Environment; Casual  Eye Contact: Good  Speech: Clear and Coherent; Normal Rate  Speech Volume: Normal  Handedness: Right   Mood and Affect  Mood: Euthymic  Affect: Congruent; Appropriate   Thought Process  Thought Processes: Coherent; Linear  Descriptions of Associations:Intact  Orientation:Full (Time, Place and Person)  Thought Content:Logical  Diagnosis of Schizophrenia or Schizoaffective disorder in past: Yes  Duration of Psychotic Symptoms: Less than six months   Hallucinations:Hallucinations: None    Ideas of Reference:None  Suicidal Thoughts:Suicidal Thoughts: No    Homicidal Thoughts:Homicidal Thoughts:  No     Sensorium  Memory: Immediate Fair; Recent Fair; Remote Poor  Judgment: Intact  Insight: Present   Executive Functions  Concentration: Good  Attention Span: Good  Recall: Fair  Fund of Knowledge: Fair  Language: Fair   Psychomotor Activity  Psychomotor Activity: Psychomotor Activity: Normal     Assets  Assets: Communication Skills; Desire for Improvement   Sleep  Sleep: Sleep: Good     Physical Exam  Physical Exam Vitals reviewed.  Constitutional:      Appearance: She is obese.  HENT:     Head: Normocephalic and atraumatic.     Mouth/Throat:     Mouth: Mucous membranes are moist.     Pharynx: Oropharynx is clear.  Pulmonary:     Effort: Pulmonary effort is normal.  Skin:    General: Skin is warm and dry.  Neurological:     General: No focal deficit present.     Mental Status: She is alert and oriented to person, place, and time.    Review of Systems  Cardiovascular:  Positive for palpitations.  Gastrointestinal:  Negative for abdominal pain.  Genitourinary: Negative.   Musculoskeletal:  Positive for myalgias.  Neurological:  Negative for dizziness and headaches.   Blood pressure 112/61, pulse (!) 106, temperature 98.2 F (36.8 C), temperature source Oral, resp. rate 16, SpO2 100 %. There is no height or weight on file to calculate BMI.  Treatment Plan Summary: Daily contact with patient to assess and evaluate symptoms and progress in treatment and Medication management  Continue with current treatment plan as follows:  Continue Trileptal 300 mg p.o. twice daily Continue Zoloft 150 mg p.o. daily Continue Seroquel 400 mg p.o. twice daily Continue trazodone 50 mg p.o. nightly  Hypothyroidism: Continue levothyroxine 100 mcg daily  Nicotine: Continue 14 mg transdermal patch  See chart for agitation protocols   CSW to continue working on discharge disposition  [name], MD 09/12/2022 8:18 AM

## 2022-09-13 NOTE — ED Notes (Signed)
Geodon was ordered for pt and [facility] Claris Gower spoke with her and pt accepted to take the shot. Writer administered the shot to pt, on the left deltoid. Pt is calm and resting on bed now, no complains, will continue to monitor for safety.

## 2022-09-13 NOTE — Progress Notes (Signed)
[facility] called the following agencies regarding placement:     [facility] Lorri Frederick                Telephone: [phone]      Fax: [phone]                   Email: [email or web address] -they only accept males 30 and up.     [facility] Malvin Johns                                Telephone: [phone]                                   Fax:  [phone]                                                Email: [email or web address] -will only possibly consider [facility].  She must have an Health visitor.  Heather confirmed she doesn't and will not be an option.      [facility] [phone] -Left a VM requesting  a returned phone call back.     [facility]                                         Telephone: [phone]  Fax: [phone]               -asking for a copy of the FL2 if he decides to assess them.  He reports he's already in the process of interviewing two candidates.  He reports he f/u by end of the week or next week.  He would want to ensure it' s a good fit.      [facility] in [place], [place], [phone] -Didn't answer.  No VM     [facility]  Telephone:                Karl Pock       [phone]. Fax: [phone], Cell: [phone]      -requested a copy of the FL2 for both clients be sent.  Also wanted to know about monthly financial costs they receive per month.  I spoke with Mathis Fare today and emailed him the information requesting he contact her tomorrow for further questions.  There agency may consider placement depending on funding.  Agency is requesting documents be emailed to [email or web address]                          [facility] Trenton Gammon                                Telephone: [phone]  Fax: [phone] - left a VM    [facility] [address] Everlene Farrier,  Telephone: [phone] or [phone] -no openings    [facility]   Willaim Rayas, [phone] -Left a vm    [facility] Telephone: [phone] or [phone] -unable to leave a vm  [facility] Telephone: [phone], Fax: [phone] -unable to leave a vm    [facility] Telephone: [phone] -no openings    [facility] [address] [place], [place], [place] [address], Telephone: [phone], Fax: [phone] -number disconnected    Mariane Baumgarten [facility] [address] [place], [place], [place] [address], Telephone: [phone], [phone] Fax: [phone] -6 bed facility and is requesting a copy of FL2.  Adult Care living facility with 24 hour supervision.  He also reports it depends on pay and PCS funding. Email request and information to: [email or web address]    Rodell Perna [facility] [place], Telephone: [phone]. Willaim Rayas Cell: [phone], Fax: [phone].  -no openings    [facility]  : [address] [place], [place], [address], (Ph) [phone], (F) [phone]    Griselda Miner: [phone] ----No current openings and asked to checked back mid December.

## 2022-09-13 NOTE — ED Notes (Signed)
Pt requested for ativan to help calm her down according to her.

## 2022-09-14 LAB — URINALYSIS, ROUTINE W REFLEX MICROSCOPIC
Bilirubin Urine: NEGATIVE
Glucose, UA: NEGATIVE mg/dL
Hgb urine dipstick: NEGATIVE
Ketones, ur: NEGATIVE mg/dL
Nitrite: NEGATIVE
Protein, ur: NEGATIVE mg/dL
Specific Gravity, Urine: 1.025 (ref 1.005–1.030)
pH: 5 (ref 5.0–8.0)

## 2022-09-14 NOTE — ED Notes (Signed)
PT siting up in bed watching tv.  No distress noted.

## 2022-09-14 NOTE — ED Notes (Signed)
Pt is requesting something stronger to help her sleep. Np Ysidro Evert was contacted about pt request

## 2022-09-14 NOTE — ED Notes (Signed)
Snack given.

## 2022-09-14 NOTE — ED Notes (Signed)
Pt sitting on bed a little agitated due to her not being able to get Ativan last night. She was frustrated as she was explaining  her concern for not getting  Ativan. Writer listened to her frustration and explained she does not have order for the medication, but she does have an order for Trazodone that she will receive tonight. She was ok with the order. Pt sitting on bed writing and watching a movie. Will continue to redirect pt if she continues to be agitated.

## 2022-09-14 NOTE — ED Notes (Signed)
Pt  is currently sleeping with no distress noted.  Staff will continue to monitor for safety.   

## 2022-09-14 NOTE — ED Provider Notes (Signed)
Behavioral Health Progress Note  Date and Time: 09/14/2022 3:05 PM Name: Nicole [name] MRN:  [id number]  Subjective:    Nicole [name] is a 31 year old female with a past psychiatric history significant for schizophrenia and major depressive disorder with psychosis who voluntarily presented to [facility] on 07/25/2022 via [facility] after a verbal altercation with [name] (not patient's legal guardian) for transient SI. This is patient's fourth visit to [facility] for similar concerns this year. Patient has been dismissed from previous group home due to threatening suicidal and homicidal ideations, then leaving the group home.  Legal Guardian: [name]  Patient denies any stressors at this time. Patient denies depression or anxiety. Patient denies suicidal or homicidal ideations. Patient further denies auditory or visual hallucinations and does not appear to be responding to internal/external stimuli. Patient denies paranoia or delusional thoughts. Patient reports that their sleep is ok and their appetite is good.  Patient is casually dressed, alert and oriented x 4. Patient is calm, cooperative, and fully engaged in conversation during the encounter. Patient maintains good eye contact. Patient's speech is clear, coherent, and with normal rate. Patient's thought process is organized. Patient's thought content is coherent. Patient exhibits euthymic mood with congruent affect. Patient denies suicidal or homicidal ideations. Patient does not appear to be responding to internal/external stimuli.  Diagnosis:  Final diagnoses:  Severe episode of recurrent major depressive disorder, with psychotic features (HCC)    Total Time spent with patient: 15 minutes  Past Psychiatric History:  Schizophrenia Major depressive disorder with psychosis  Past Medical History:  Past Medical History:  Diagnosis Date   Anxiety     Depression    Hypothyroidism 08/07/2022   Tobacco use disorder 08/07/2022    Past Surgical History:  Procedure Laterality Date   WISDOM TOOTH EXTRACTION Bilateral 2020   Family History:  Family History  Problem Relation Age of Onset   Hypertension Father    Diabetes Father    Family Psychiatric  History: not noted Social History:  Social History   Substance and Sexual Activity  Alcohol Use Never     Social History   Substance and Sexual Activity  Drug Use Never    Social History   Socioeconomic History   Marital status: Single    Spouse name: Not on file   Number of children: Not on file   Years of education: Not on file   Highest education level: Not on file  Occupational History   Not on file  Tobacco Use   Smoking status: Every Day    Types: Cigarettes   Smokeless tobacco: Never  Vaping Use   Vaping Use: Some days  Substance and Sexual Activity   Alcohol use: Never   Drug use: Never   Sexual activity: Yes    Partners: Female    Birth control/protection: Implant  Other Topics Concern   Not on file  Social History Narrative   Not on file   Social Determinants of Health   Financial Resource Strain: Not on file  Food Insecurity: Not on file  Transportation Needs: Not on file  Physical Activity: Not on file  Stress: Not on file  Social Connections: Not on file   SDOH:  SDOH Screenings   Depression (PHQ2-9): Low Risk  (06/10/2022)  Tobacco Use: High Risk (09/01/2022)   Additional Social History:    Pain Medications: SEE MAR Prescriptions: SEE MAR Over the Counter: SEE MAR History  of alcohol / drug use?: Yes Longest period of sobriety (when/how long): N/A Negative Consequences of Use:  (NONE) Withdrawal Symptoms: None Name of Substance 1: ETOH IN PAST--PT CURRENTLY DENIES 1 - Frequency: SOCIAL USE ETOH IN PAST 1 - Method of Aquiring: LEGAL 1- Route of Use: ORAL DRINK Name of Substance 2: NICOTINE-VAPE AND CIGARETTES 2 - Amount (size/oz):  LESS THAN ONE PACK 2 - Frequency: DAILY 2 - Method of Aquiring: LEGAL 2 - Route of Substance Use: ORAL SMOKE    Sleep: Good  Appetite:  Good  Current Medications:  Current Facility-Administered Medications  Medication Dose Route Frequency Provider Last Rate Last Admin   acetaminophen (TYLENOL) tablet 650 mg  650 mg Oral Q6H PRN Onuoha, Chinwendu V, NP   650 mg at 09/12/22 2105   alum & mag hydroxide-simeth (MAALOX/MYLANTA) 200-200-20 MG/5ML suspension 30 mL  30 mL Oral Q4H PRN Onuoha, Chinwendu V, NP   30 mL at 07/27/22 2151   ARIPiprazole ER (ABILIFY MAINTENA) injection 400 mg  400 mg Intramuscular Q28 days Princess Bruins, DO   400 mg at 08/30/22 1338   docusate sodium (COLACE) capsule 100 mg  100 mg Oral Daily [name], MD   100 mg at 09/14/22 0930   hydrOXYzine (ATARAX) tablet 25 mg  25 mg Oral TID PRN Onuoha, Chinwendu V, NP   25 mg at 09/11/22 2120   ibuprofen (ADVIL) tablet 400 mg  400 mg Oral Q6H PRN Onuoha, Chinwendu V, NP   400 mg at 09/06/22 1314   levothyroxine (SYNTHROID) tablet 100 mcg  100 mcg Oral Q0600 Princess Bruins, DO   100 mcg at 09/14/22 0617   metFORMIN (GLUCOPHAGE) tablet 500 mg  500 mg Oral Q breakfast Park Pope, MD   500 mg at 09/14/22 0930   nicotine (NICODERM CQ - dosed in mg/24 hours) patch 14 mg  14 mg Transdermal Q0600 Onuoha, Chinwendu V, NP   14 mg at 09/14/22 0930   ondansetron (ZOFRAN-ODT) disintegrating tablet 4 mg  4 mg Oral Q8H PRN Sindy Guadeloupe, NP   4 mg at 08/30/22 2122   Oxcarbazepine (TRILEPTAL) tablet 300 mg  300 mg Oral BID Onuoha, Chinwendu V, NP   300 mg at 09/14/22 0930   pantoprazole (PROTONIX) EC tablet 20 mg  20 mg Oral Daily Princess Bruins, DO   20 mg at 09/14/22 0930   QUEtiapine (SEROQUEL) tablet 400 mg  400 mg Oral BID Onuoha, Chinwendu V, NP   400 mg at 09/14/22 0930   sertraline (ZOLOFT) tablet 150 mg  150 mg Oral Daily Onuoha, Chinwendu V, NP   150 mg at 09/14/22 0929   traZODone (DESYREL) tablet 50 mg  50 mg Oral Seabron Spates, MD   50 mg at 09/13/22 2157   valACYclovir (VALTREX) tablet 500 mg  500 mg Oral Daily Princess Bruins, DO   500 mg at 09/14/22 0930   ziprasidone (GEODON) injection 20 mg  20 mg Intramuscular Q12H PRN Onuoha, Chinwendu V, NP   20 mg at 09/13/22 2309   Current Outpatient Medications  Medication Sig Dispense Refill   ABILIFY MAINTENA 400 MG PRSY prefilled syringe 400 mg every 28 (twenty-eight) days.     cetirizine (ZYRTEC) 10 MG tablet Take 10 mg by mouth daily.     cyclobenzaprine (FLEXERIL) 10 MG tablet Take 1 tablet (10 mg total) by mouth 2 (two) times daily as needed for muscle spasms. 20 tablet 0   fluticasone (FLONASE) 50 MCG/ACT nasal spray Place 1 spray into  both nostrils daily.     meloxicam (MOBIC) 15 MG tablet Take 15 mg by mouth daily.     Oxcarbazepine (TRILEPTAL) 300 MG tablet Take 1 tablet (300 mg total) by mouth 2 (two) times daily. 60 tablet 0   QUEtiapine (SEROQUEL) 400 MG tablet Take 1 tablet (400 mg total) by mouth 2 (two) times daily. 60 tablet 0   sertraline (ZOLOFT) 50 MG tablet Take 3 tablets (150 mg total) by mouth in the morning. 90 tablet 0   traZODone (DESYREL) 100 MG tablet Take 1 tablet (100 mg total) by mouth at bedtime. 30 tablet 0   valACYclovir (VALTREX) 500 MG tablet Take 500 mg by mouth daily.      Labs  Lab Results:  Admission on 07/25/2022  Component Date Value Ref Range Status   SARS Coronavirus 2 by RT PCR 07/25/2022 NEGATIVE  NEGATIVE Final   Comment: (NOTE) SARS-CoV-2 target nucleic acids are NOT DETECTED.  The SARS-CoV-2 RNA is generally detectable in upper respiratory specimens during the acute phase of infection. The lowest concentration of SARS-CoV-2 viral copies this assay can detect is 138 copies/mL. A negative result does not preclude SARS-Cov-2 infection and should not be used as the sole basis for treatment or other patient management decisions. A negative result may occur with  improper specimen collection/handling, submission of  specimen other than nasopharyngeal swab, presence of viral mutation(s) within the areas targeted by this assay, and inadequate number of viral copies(<138 copies/mL). A negative result must be combined with clinical observations, patient history, and epidemiological information. The expected result is Negative.  Fact Sheet for Patients:  BloggerCourse.com  Fact Sheet for Healthcare Providers:  SeriousBroker.it  This test is no                          t yet approved or cleared by the [place] FDA and  has been authorized for detection and/or diagnosis of SARS-CoV-2 by FDA under an Emergency Use Authorization (EUA). This EUA will remain  in effect (meaning this test can be used) for the duration of the COVID-19 declaration under Section 564(b)(1) of the Act, 21 U.S.C.section 360bbb-3(b)(1), unless the authorization is terminated  or revoked sooner.       Influenza A by PCR 07/25/2022 NEGATIVE  NEGATIVE Final   Influenza B by PCR 07/25/2022 NEGATIVE  NEGATIVE Final   Comment: (NOTE) The Xpert Xpress SARS-CoV-2/FLU/RSV plus assay is intended as an aid in the diagnosis of influenza from Nasopharyngeal swab specimens and should not be used as a sole basis for treatment. Nasal washings and aspirates are unacceptable for Xpert Xpress SARS-CoV-2/FLU/RSV testing.  Fact Sheet for Patients: BloggerCourse.com  Fact Sheet for Healthcare Providers: SeriousBroker.it  This test is not yet approved or cleared by the [place] FDA and has been authorized for detection and/or diagnosis of SARS-CoV-2 by FDA under an Emergency Use Authorization (EUA). This EUA will remain in effect (meaning this test can be used) for the duration of the COVID-19 declaration under Section 564(b)(1) of the Act, 21 U.S.C. section 360bbb-3(b)(1), unless the authorization is terminated  or revoked.  Performed at [facility], [address] [place], [place] [address]    WBC 07/25/2022 8.3  4.0 - 10.5 K/uL Final   RBC 07/25/2022 4.44  3.87 - 5.11 MIL/uL Final   Hemoglobin 07/25/2022 13.7  12.0 - 15.0 g/dL Final   HCT 60/45/4098 40.2  36.0 - 46.0 % Final   MCV  07/25/2022 90.5  80.0 - 100.0 fL Final   MCH 07/25/2022 30.9  26.0 - 34.0 pg Final   MCHC 07/25/2022 34.1  30.0 - 36.0 g/dL Final   RDW 60/45/409810/09/2022 12.2  11.5 - 15.5 % Final   Platelets 07/25/2022 248  150 - 400 K/uL Final   nRBC 07/25/2022 0.0  0.0 - 0.2 % Final   Neutrophils Relative % 07/25/2022 43  % Final   Neutro Abs 07/25/2022 3.6  1.7 - 7.7 K/uL Final   Lymphocytes Relative 07/25/2022 52  % Final   Lymphs Abs 07/25/2022 4.2 (H)  0.7 - 4.0 K/uL Final   Monocytes Relative 07/25/2022 5  % Final   Monocytes Absolute 07/25/2022 0.4  0.1 - 1.0 K/uL Final   Eosinophils Relative 07/25/2022 0  % Final   Eosinophils Absolute 07/25/2022 0.0  0.0 - 0.5 K/uL Final   Basophils Relative 07/25/2022 0  % Final   Basophils Absolute 07/25/2022 0.0  0.0 - 0.1 K/uL Final   Immature Granulocytes 07/25/2022 0  % Final   Abs Immature Granulocytes 07/25/2022 0.02  0.00 - 0.07 K/uL Final   Performed at [facility], [address] [place], [place] [address]   Sodium 07/25/2022 138  135 - 145 mmol/L Final   Potassium 07/25/2022 4.0  3.5 - 5.1 mmol/L Final   Chloride 07/25/2022 104  98 - 111 mmol/L Final   CO2 07/25/2022 29  22 - 32 mmol/L Final   Glucose, Bld 07/25/2022 83  70 - 99 mg/dL Final   Glucose reference range applies only to samples taken after fasting for at least 8 hours.   BUN 07/25/2022 11  6 - 20 mg/dL Final   Creatinine, Ser 07/25/2022 0.97  0.44 - 1.00 mg/dL Final   Calcium 78/29/562110/09/2022 9.2  8.9 - 10.3 mg/dL Final   Total Protein 30/86/578410/09/2022 7.0  6.5 - 8.1 g/dL Final   Albumin 69/62/952810/09/2022 3.8  3.5 - 5.0 g/dL Final   AST 41/32/440110/09/2022 18  15 - 41 U/L Final   ALT 07/25/2022 22  0 - 44 U/L Final    Alkaline Phosphatase 07/25/2022 64  38 - 126 U/L Final   Total Bilirubin 07/25/2022 0.2 (L)  0.3 - 1.2 mg/dL Final   GFR, Estimated 07/25/2022 >60  >60 mL/min Final   Comment: (NOTE) Calculated using the CKD-EPI Creatinine Equation (2021)    Anion gap 07/25/2022 5  5 - 15 Final   Performed at [facility], [address] [place], [place] [address]   POC Amphetamine UR 07/25/2022 None Detected  NONE DETECTED (Cut Off Level 1000 ng/mL) Preliminary   POC Secobarbital (BAR) 07/25/2022 None Detected  NONE DETECTED (Cut Off Level 300 ng/mL) Preliminary   POC Buprenorphine (BUP) 07/25/2022 None Detected  NONE DETECTED (Cut Off Level 10 ng/mL) Preliminary   POC Oxazepam (BZO) 07/25/2022 None Detected  NONE DETECTED (Cut Off Level 300 ng/mL) Preliminary   POC Cocaine UR 07/25/2022 None Detected  NONE DETECTED (Cut Off Level 300 ng/mL) Preliminary   POC Methamphetamine UR 07/25/2022 None Detected  NONE DETECTED (Cut Off Level 1000 ng/mL) Preliminary   POC Morphine 07/25/2022 None Detected  NONE DETECTED (Cut Off Level 300 ng/mL) Preliminary   POC Methadone UR 07/25/2022 None Detected  NONE DETECTED (Cut Off Level 300 ng/mL) Preliminary   POC Oxycodone UR 07/25/2022 None Detected  NONE DETECTED (Cut Off Level 100 ng/mL) Preliminary   POC Marijuana UR 07/25/2022 None Detected  NONE DETECTED (Cut Off Level 50  ng/mL) Preliminary   SARSCOV2ONAVIRUS 2 AG 07/25/2022 NEGATIVE  NEGATIVE Final   Comment: (NOTE) SARS-CoV-2 antigen NOT DETECTED.   Negative results are presumptive.  Negative results do not preclude SARS-CoV-2 infection and should not be used as the sole basis for treatment or other patient management decisions, including infection  control decisions, particularly in the presence of clinical signs and  symptoms consistent with COVID-19, or in those who have been in contact with the virus.  Negative results must be combined with clinical observations, patient history, and  epidemiological information. The expected result is Negative.  Fact Sheet for Patients: https://www.jennings-kim.com/  Fact Sheet for Healthcare Providers: https://alexander-rogers.biz/  This test is not yet approved or cleared by the [place] FDA and  has been authorized for detection and/or diagnosis of SARS-CoV-2 by FDA under an Emergency Use Authorization (EUA).  This EUA will remain in effect (meaning this test can be used) for the duration of  the COV                          ID-19 declaration under Section 564(b)(1) of the Act, 21 U.S.C. section 360bbb-3(b)(1), unless the authorization is terminated or revoked sooner.     Cholesterol 07/25/2022 181  0 - 200 mg/dL Final   Triglycerides 16/07/9603 118  <150 mg/dL Final   HDL 54/06/8118 45  >40 mg/dL Final   Total CHOL/HDL Ratio 07/25/2022 4.0  RATIO Final   VLDL 07/25/2022 24  0 - 40 mg/dL Final   LDL Cholesterol 07/25/2022 112 (H)  0 - 99 mg/dL Final   Comment:        Total Cholesterol/HDL:CHD Risk Coronary Heart Disease Risk Table                     Men   Women  1/2 Average Risk   3.4   3.3  Average Risk       5.0   4.4  2 X Average Risk   9.6   7.1  3 X Average Risk  23.4   11.0        Use the calculated Patient Ratio above and the CHD Risk Table to determine the patient's CHD Risk.        ATP III CLASSIFICATION (LDL):  <100     mg/dL   Optimal  147-829  mg/dL   Near or Above                    Optimal  130-159  mg/dL   Borderline  562-130  mg/dL   High  >865     mg/dL   Very High Performed at [facility], [address], [place], [place] [address]    TSH 07/25/2022 6.668 (H)  0.350 - 4.500 uIU/mL Final   Comment: Performed by a 3rd Generation assay with a functional sensitivity of <=0.01 uIU/mL. Performed at [facility], [address] [place], [place] [address]    Glucose-Capillary 07/26/2022 104 (H)  70 - 99 mg/dL Final   Glucose reference range applies only to  samples taken after fasting for at least 8 hours.   T3, Free 07/31/2022 2.3  2.0 - 4.4 pg/mL Final   Comment: (NOTE) Performed At: [facility] [address] [place], [place] [id number] Jolene Schimke MD UU:[id number]    Free T4 07/31/2022 0.60 (L)  0.61 - 1.12 ng/dL Final   Comment: (NOTE) Biotin ingestion may interfere with  free T4 tests. If the results are inconsistent with the TSH level, previous test results, or the clinical presentation, then consider biotin interference. If needed, order repeat testing after stopping biotin. Performed at [facility], [address] [place], [place] [address]    Glucose-Capillary 08/29/2022 100 (H)  70 - 99 mg/dL Final   Glucose reference range applies only to samples taken after fasting for at least 8 hours.   TSH 09/05/2022 0.793  0.350 - 4.500 uIU/mL Final   Comment: Performed by a 3rd Generation assay with a functional sensitivity of <=0.01 uIU/mL. Performed at [facility], [address] [place], [place] [address]    Free T4 09/05/2022 0.73  0.61 - 1.12 ng/dL Final   Comment: (NOTE) Biotin ingestion may interfere with free T4 tests. If the results are inconsistent with the TSH level, previous test results, or the clinical presentation, then consider biotin interference. If needed, order repeat testing after stopping biotin. Performed at [facility], [address] [place], [place] [address]    Preg Test, Ur 09/06/2022 Negative  Negative Final   Preg Test, Ur 09/06/2022 NEGATIVE  NEGATIVE Final   Comment:        THE SENSITIVITY OF THIS METHODOLOGY IS >24 mIU/mL    Preg Test, Ur 09/05/2022 NEGATIVE  NEGATIVE Final   Comment:        THE SENSITIVITY OF THIS METHODOLOGY IS >24 mIU/mL    Sodium 09/13/2022 136  135 - 145 mmol/L Final   Potassium 09/13/2022 4.5  3.5 - 5.1 mmol/L Final   Chloride 09/13/2022 105  98 - 111 mmol/L Final   CO2 09/13/2022 21 (L)  22 - 32 mmol/L Final   Glucose, Bld  09/13/2022 111 (H)  70 - 99 mg/dL Final   Glucose reference range applies only to samples taken after fasting for at least 8 hours.   BUN 09/13/2022 14  6 - 20 mg/dL Final   Creatinine, Ser 09/13/2022 0.92  0.44 - 1.00 mg/dL Final   Calcium 91/47/8295 9.1  8.9 - 10.3 mg/dL Final   GFR, Estimated 09/13/2022 >60  >60 mL/min Final   Comment: (NOTE) Calculated using the CKD-EPI Creatinine Equation (2021)    Anion gap 09/13/2022 10  5 - 15 Final   Performed at [facility], [address], [place], [place] [address]   Vitamin B-12 09/13/2022 585  180 - 914 pg/mL Final   Comment: (NOTE) This assay is not validated for testing neonatal or myeloproliferative syndrome specimens for Vitamin B12 levels. Performed at [facility], [address], [place], [place] [address]   Admission on 07/04/2022, Discharged on 07/05/2022  Component Date Value Ref Range Status   Sodium 07/04/2022 142  135 - 145 mmol/L Final   Potassium 07/04/2022 4.4  3.5 - 5.1 mmol/L Final   Chloride 07/04/2022 109  98 - 111 mmol/L Final   CO2 07/04/2022 26  22 - 32 mmol/L Final   Glucose, Bld 07/04/2022 96  70 - 99 mg/dL Final   Glucose reference range applies only to samples taken after fasting for at least 8 hours.   BUN 07/04/2022 15  6 - 20 mg/dL Final   Creatinine, Ser 07/04/2022 0.83  0.44 - 1.00 mg/dL Final   Calcium 46/96/2952 9.3  8.9 - 10.3 mg/dL Final   Total Protein 84/13/2440 7.2  6.5 - 8.1 g/dL Final   Albumin 08/10/2535 3.9  3.5 - 5.0 g/dL Final   AST  07/04/2022 23  15 - 41 U/L Final   ALT 07/04/2022 28  0 - 44 U/L Final   Alkaline Phosphatase 07/04/2022 77  38 - 126 U/L Final   Total Bilirubin 07/04/2022 0.4  0.3 - 1.2 mg/dL Final   GFR, Estimated 07/04/2022 >60  >60 mL/min Final   Comment: (NOTE) Calculated using the CKD-EPI Creatinine Equation (2021)    Anion gap 07/04/2022 7  5 - 15 Final   Performed at [facility], [address], [place], [place] [address]    Alcohol, Ethyl (B) 07/04/2022 <10  <10 mg/dL Final   Comment: (NOTE) Lowest detectable limit for serum alcohol is 10 mg/dL.  For medical purposes only. Performed at [facility], [address], [place], [place] [address]    WBC 07/04/2022 7.0  4.0 - 10.5 K/uL Final   RBC 07/04/2022 4.18  3.87 - 5.11 MIL/uL Final   Hemoglobin 07/04/2022 12.8  12.0 - 15.0 g/dL Final   HCT 09/81/1914 39.1  36.0 - 46.0 % Final   MCV 07/04/2022 93.5  80.0 - 100.0 fL Final   MCH 07/04/2022 30.6  26.0 - 34.0 pg Final   MCHC 07/04/2022 32.7  30.0 - 36.0 g/dL Final   RDW 78/29/5621 12.9  11.5 - 15.5 % Final   Platelets 07/04/2022 243  150 - 400 K/uL Final   nRBC 07/04/2022 0.0  0.0 - 0.2 % Final   Neutrophils Relative % 07/04/2022 43  % Final   Neutro Abs 07/04/2022 3.0  1.7 - 7.7 K/uL Final   Lymphocytes Relative 07/04/2022 50  % Final   Lymphs Abs 07/04/2022 3.5  0.7 - 4.0 K/uL Final   Monocytes Relative 07/04/2022 7  % Final   Monocytes Absolute 07/04/2022 0.5  0.1 - 1.0 K/uL Final   Eosinophils Relative 07/04/2022 0  % Final   Eosinophils Absolute 07/04/2022 0.0  0.0 - 0.5 K/uL Final   Basophils Relative 07/04/2022 0  % Final   Basophils Absolute 07/04/2022 0.0  0.0 - 0.1 K/uL Final   Immature Granulocytes 07/04/2022 0  % Final   Abs Immature Granulocytes 07/04/2022 0.01  0.00 - 0.07 K/uL Final   Performed at [facility], [address], [place], [place] [address]   I-stat hCG, quantitative 07/04/2022 <5.0  <5 mIU/mL Final   Comment 3 07/04/2022          Final   Comment:   GEST. AGE      CONC.  (mIU/mL)   <=1 WEEK        5 - 50     2 WEEKS       50 - 500     3 WEEKS       100 - 10,000     4 WEEKS     1,000 - 30,000        FEMALE AND NON-PREGNANT FEMALE:     LESS THAN 5 mIU/mL   Admission on 06/14/2022, Discharged on 06/14/2022  Component Date Value Ref Range Status   Color, UA 06/14/2022 yellow  yellow Final   Clarity, UA 06/14/2022 cloudy (A)  clear Final    Glucose, UA 06/14/2022 negative  negative mg/dL Final   Bilirubin, UA 78/46/9629 negative  negative Final   Ketones, POC UA 06/14/2022 negative  negative mg/dL Final   Spec Grav, UA 52/84/1324 1.025  1.010 - 1.025 Final   Blood, UA 06/14/2022 negative  negative Final   pH, UA 06/14/2022 7.5  5.0 -  8.0 Final   Protein Ur, POC 06/14/2022 =30 (A)  negative mg/dL Final   Urobilinogen, UA 06/14/2022 0.2  0.2 or 1.0 E.U./dL Final   Nitrite, UA 16/07/9603 Negative  Negative Final   Leukocytes, UA 06/14/2022 Negative  Negative Final   Preg Test, Ur 06/14/2022 Negative  Negative Final   Specimen Description 06/14/2022 URINE, CLEAN CATCH   Final   Special Requests 06/14/2022    Final                   Value:NONE Performed at [facility], [address], [place], [place] [address]    Culture 06/14/2022 MULTIPLE SPECIES PRESENT, SUGGEST RECOLLECTION (A)   Final   Report Status 06/14/2022 06/16/2022 FINAL   Final  Admission on 06/07/2022, Discharged on 06/10/2022  Component Date Value Ref Range Status   SARS Coronavirus 2 by RT PCR 06/07/2022 NEGATIVE  NEGATIVE Final   Comment: (NOTE) SARS-CoV-2 target nucleic acids are NOT DETECTED.  The SARS-CoV-2 RNA is generally detectable in upper respiratory specimens during the acute phase of infection. The lowest concentration of SARS-CoV-2 viral copies this assay can detect is 138 copies/mL. A negative result does not preclude SARS-Cov-2 infection and should not be used as the sole basis for treatment or other patient management decisions. A negative result may occur with  improper specimen collection/handling, submission of specimen other than nasopharyngeal swab, presence of viral mutation(s) within the areas targeted by this assay, and inadequate number of viral copies(<138 copies/mL). A negative result must be combined with clinical observations, patient history, and epidemiological information. The expected result is Negative.  Fact  Sheet for Patients:  BloggerCourse.com  Fact Sheet for Healthcare Providers:  SeriousBroker.it  This test is no                          t yet approved or cleared by the [place] FDA and  has been authorized for detection and/or diagnosis of SARS-CoV-2 by FDA under an Emergency Use Authorization (EUA). This EUA will remain  in effect (meaning this test can be used) for the duration of the COVID-19 declaration under Section 564(b)(1) of the Act, 21 U.S.C.section 360bbb-3(b)(1), unless the authorization is terminated  or revoked sooner.       Influenza A by PCR 06/07/2022 NEGATIVE  NEGATIVE Final   Influenza B by PCR 06/07/2022 NEGATIVE  NEGATIVE Final   Comment: (NOTE) The Xpert Xpress SARS-CoV-2/FLU/RSV plus assay is intended as an aid in the diagnosis of influenza from Nasopharyngeal swab specimens and should not be used as a sole basis for treatment. Nasal washings and aspirates are unacceptable for Xpert Xpress SARS-CoV-2/FLU/RSV testing.  Fact Sheet for Patients: BloggerCourse.com  Fact Sheet for Healthcare Providers: SeriousBroker.it  This test is not yet approved or cleared by the [place] FDA and has been authorized for detection and/or diagnosis of SARS-CoV-2 by FDA under an Emergency Use Authorization (EUA). This EUA will remain in effect (meaning this test can be used) for the duration of the COVID-19 declaration under Section 564(b)(1) of the Act, 21 U.S.C. section 360bbb-3(b)(1), unless the authorization is terminated or revoked.  Performed at [facility], [address] [place], [place] [address]    WBC 06/07/2022 5.7  4.0 - 10.5 K/uL Final   RBC 06/07/2022 4.39  3.87 - 5.11 MIL/uL Final   Hemoglobin 06/07/2022 13.2  12.0 - 15.0 g/dL Final   HCT 78/29/5621 40.4  36.0 - 46.0 %  Final   MCV 06/07/2022 92.0  80.0 - 100.0 fL Final   MCH 06/07/2022  30.1  26.0 - 34.0 pg Final   MCHC 06/07/2022 32.7  30.0 - 36.0 g/dL Final   RDW 31/49/7026 12.4  11.5 - 15.5 % Final   Platelets 06/07/2022 308  150 - 400 K/uL Final   nRBC 06/07/2022 0.0  0.0 - 0.2 % Final   Neutrophils Relative % 06/07/2022 42  % Final   Neutro Abs 06/07/2022 2.4  1.7 - 7.7 K/uL Final   Lymphocytes Relative 06/07/2022 54  % Final   Lymphs Abs 06/07/2022 3.1  0.7 - 4.0 K/uL Final   Monocytes Relative 06/07/2022 4  % Final   Monocytes Absolute 06/07/2022 0.2  0.1 - 1.0 K/uL Final   Eosinophils Relative 06/07/2022 0  % Final   Eosinophils Absolute 06/07/2022 0.0  0.0 - 0.5 K/uL Final   Basophils Relative 06/07/2022 0  % Final   Basophils Absolute 06/07/2022 0.0  0.0 - 0.1 K/uL Final   Immature Granulocytes 06/07/2022 0  % Final   Abs Immature Granulocytes 06/07/2022 0.01  0.00 - 0.07 K/uL Final   Performed at [facility], [address], [place], [place] [address]   Sodium 06/07/2022 139  135 - 145 mmol/L Final   Potassium 06/07/2022 4.0  3.5 - 5.1 mmol/L Final   Chloride 06/07/2022 104  98 - 111 mmol/L Final   CO2 06/07/2022 28  22 - 32 mmol/L Final   Glucose, Bld 06/07/2022 104 (H)  70 - 99 mg/dL Final   Glucose reference range applies only to samples taken after fasting for at least 8 hours.   BUN 06/07/2022 8  6 - 20 mg/dL Final   Creatinine, Ser 06/07/2022 0.84  0.44 - 1.00 mg/dL Final   Calcium 85/11/7739 9.1  8.9 - 10.3 mg/dL Final   Total Protein 28/78/6767 6.9  6.5 - 8.1 g/dL Final   Albumin 20/94/7096 3.7  3.5 - 5.0 g/dL Final   AST 28/36/6294 19  15 - 41 U/L Final   ALT 06/07/2022 22  0 - 44 U/L Final   Alkaline Phosphatase 06/07/2022 54  38 - 126 U/L Final   Total Bilirubin 06/07/2022 0.4  0.3 - 1.2 mg/dL Final   GFR, Estimated 06/07/2022 >60  >60 mL/min Final   Comment: (NOTE) Calculated using the CKD-EPI Creatinine Equation (2021)    Anion gap 06/07/2022 7  5 - 15 Final   Performed at [facility], [address] [place], [place]  [address]   Hgb A1c MFr Bld 06/07/2022 5.0  4.8 - 5.6 % Final   Comment: (NOTE) Pre diabetes:          5.7%-6.4%  Diabetes:              >6.4%  Glycemic control for   <7.0% adults with diabetes    Mean Plasma Glucose 06/07/2022 96.8  mg/dL Final   Performed at [facility], [address] [place], [place] [address]   TSH 06/07/2022 1.620  0.350 - 4.500 uIU/mL Final   Comment: Performed by a 3rd Generation assay with a functional sensitivity of <=0.01 uIU/mL. Performed at [facility], [address] [place], [place] [address]    RPR Ser Ql 06/07/2022 NON REACTIVE  NON REACTIVE Final   Performed at [facility], [address] [place], [place] [address]   Color, Urine 06/07/2022 YELLOW  YELLOW Final   APPearance 06/07/2022  HAZY (A)  CLEAR Final   Specific Gravity, Urine 06/07/2022 1.018  1.005 - 1.030 Final   pH 06/07/2022 7.0  5.0 - 8.0 Final   Glucose, UA 06/07/2022 NEGATIVE  NEGATIVE mg/dL Final   Hgb urine dipstick 06/07/2022 NEGATIVE  NEGATIVE Final   Bilirubin Urine 06/07/2022 NEGATIVE  NEGATIVE Final   Ketones, ur 06/07/2022 NEGATIVE  NEGATIVE mg/dL Final   Protein, ur 16/07/9603 NEGATIVE  NEGATIVE mg/dL Final   Nitrite 54/06/8118 NEGATIVE  NEGATIVE Final   Leukocytes,Ua 06/07/2022 NEGATIVE  NEGATIVE Final   Performed at [facility], [address] [place], [place] [address]   Cholesterol 06/07/2022 164  0 - 200 mg/dL Final   Triglycerides 95/62/1308 158 (H)  <150 mg/dL Final   HDL 65/78/4696 44  >40 mg/dL Final   Total CHOL/HDL Ratio 06/07/2022 3.7  RATIO Final   VLDL 06/07/2022 32  0 - 40 mg/dL Final   LDL Cholesterol 06/07/2022 88  0 - 99 mg/dL Final   Comment:        Total Cholesterol/HDL:CHD Risk Coronary Heart Disease Risk Table                     Men   Women  1/2 Average Risk   3.4   3.3  Average Risk       5.0   4.4  2 X Average Risk   9.6   7.1  3 X Average Risk  23.4   11.0        Use the calculated Patient Ratio above and the  CHD Risk Table to determine the patient's CHD Risk.        ATP III CLASSIFICATION (LDL):  <100     mg/dL   Optimal  295-284  mg/dL   Near or Above                    Optimal  130-159  mg/dL   Borderline  132-440  mg/dL   High  >102     mg/dL   Very High Performed at [facility], [address] [place], [place] [address]    HIV Screen 4th Generation wRfx 06/07/2022 Non Reactive  Non Reactive Final   Performed at [facility], [address] [place], [place] [address]   SARSCOV2ONAVIRUS 2 AG 06/07/2022 NEGATIVE  NEGATIVE Final   Comment: (NOTE) SARS-CoV-2 antigen NOT DETECTED.   Negative results are presumptive.  Negative results do not preclude SARS-CoV-2 infection and should not be used as the sole basis for treatment or other patient management decisions, including infection  control decisions, particularly in the presence of clinical signs and  symptoms consistent with COVID-19, or in those who have been in contact with the virus.  Negative results must be combined with clinical observations, patient history, and epidemiological information. The expected result is Negative.  Fact Sheet for Patients: https://www.jennings-kim.com/  Fact Sheet for Healthcare Providers: https://alexander-rogers.biz/  This test is not yet approved or cleared by the [place] FDA and  has been authorized for detection and/or diagnosis of SARS-CoV-2 by FDA under an Emergency Use Authorization (EUA).  This EUA will remain in effect (meaning this test can be used) for the duration of  the COV                          ID-19 declaration under Section 564(b)(1) of the Act, 21 U.S.C. section 360bbb-3(b)(1), unless the  authorization is terminated or revoked sooner.     POC Amphetamine UR 06/07/2022 None Detected  NONE DETECTED (Cut Off Level 1000 ng/mL) Final   POC Secobarbital (BAR) 06/07/2022 None Detected  NONE DETECTED (Cut Off Level 300 ng/mL) Final    POC Buprenorphine (BUP) 06/07/2022 None Detected  NONE DETECTED (Cut Off Level 10 ng/mL) Final   POC Oxazepam (BZO) 06/07/2022 None Detected  NONE DETECTED (Cut Off Level 300 ng/mL) Final   POC Cocaine UR 06/07/2022 None Detected  NONE DETECTED (Cut Off Level 300 ng/mL) Final   POC Methamphetamine UR 06/07/2022 None Detected  NONE DETECTED (Cut Off Level 1000 ng/mL) Final   POC Morphine 06/07/2022 None Detected  NONE DETECTED (Cut Off Level 300 ng/mL) Final   POC Methadone UR 06/07/2022 None Detected  NONE DETECTED (Cut Off Level 300 ng/mL) Final   POC Oxycodone UR 06/07/2022 None Detected  NONE DETECTED (Cut Off Level 100 ng/mL) Final   POC Marijuana UR 06/07/2022 None Detected  NONE DETECTED (Cut Off Level 50 ng/mL) Final   Preg Test, Ur 06/08/2022 NEGATIVE  NEGATIVE Final   Comment:        THE SENSITIVITY OF THIS METHODOLOGY IS >20 mIU/mL. Performed at [facility], [address], [place], [place] [address]    Preg Test, Ur 06/08/2022 NEGATIVE  NEGATIVE Final   Comment:        THE SENSITIVITY OF THIS METHODOLOGY IS >24 mIU/mL     Blood Alcohol level:  Lab Results  Component Value Date   ETH <10 07/04/2022   ETH <10 02/07/2022    Metabolic Disorder Labs: Lab Results  Component Value Date   HGBA1C 5.0 06/07/2022   MPG 96.8 06/07/2022   MPG 96.8 02/07/2022   No results found for: "PROLACTIN" Lab Results  Component Value Date   CHOL 181 07/25/2022   TRIG 118 07/25/2022   HDL 45 07/25/2022   CHOLHDL 4.0 07/25/2022   VLDL 24 07/25/2022   LDLCALC 112 (H) 07/25/2022   LDLCALC 88 06/07/2022    Therapeutic Lab Levels: No results found for: "LITHIUM" No results found for: "VALPROATE" No results found for: "CBMZ"  Physical Findings   GAD-7    Flowsheet Row [facility]/17/2021 [facility]  Total GAD-7 Score 17      PHQ2-9    Flowsheet Row ED from 06/07/2022 in [facility] Office Visit from  12/17/2021 in [facility]  PHQ-2 Total Score 1 2  PHQ-9 Total Score 2 8      Flowsheet Row ED from 07/25/2022 in [facility] ED from 07/04/2022 in [facility] from 06/14/2022 in [facility]   C-SSRS RISK CATEGORY High Risk No Risk No Risk        Musculoskeletal  Strength & Muscle Tone: within normal limits Gait & Station: normal Patient leans: N/A  Psychiatric Specialty Exam  Presentation  General Appearance:  Appropriate for Environment; Casual  Eye Contact: Good  Speech: Clear and Coherent; Normal Rate  Speech Volume: Normal  Handedness: Right   Mood and Affect  Mood: Euthymic  Affect: Appropriate; Congruent   Thought Process  Thought Processes: Coherent; Linear  Descriptions of Associations:Intact  Orientation:Full (Time, Place and Person)  Thought Content:Logical  Diagnosis of Schizophrenia or Schizoaffective disorder in past: Yes  Duration of Psychotic Symptoms: Less than six months   Hallucinations:Hallucinations: None  Ideas of Reference:None  Suicidal Thoughts:Suicidal Thoughts: No  Homicidal Thoughts:Homicidal Thoughts: No   Sensorium  Memory: Immediate Fair; Recent Fair; Remote Poor  Judgment: Intact  Insight: Present   Executive Functions  Concentration: Good  Attention Span: Good  Recall: Fair  Fund of Knowledge: Fair  Language: Fair   Psychomotor Activity  Psychomotor Activity: Psychomotor Activity: Normal   Assets  Assets: Communication Skills; Desire for Improvement   Sleep  Sleep: Sleep: Good   No data recorded  Physical Exam  Physical Exam Constitutional:      Appearance: Normal appearance.  HENT:     Head: Normocephalic and atraumatic.     Nose: Nose normal.     Mouth/Throat:     Mouth: Mucous membranes are moist.  Eyes:     Extraocular Movements: Extraocular  movements intact.     Pupils: Pupils are equal, round, and reactive to light.  Cardiovascular:     Rate and Rhythm: Normal rate and regular rhythm.  Pulmonary:     Effort: Pulmonary effort is normal.     Breath sounds: Normal breath sounds.  Abdominal:     General: Abdomen is flat.  Musculoskeletal:        General: Normal range of motion.     Cervical back: Normal range of motion and neck supple.  Skin:    General: Skin is warm and dry.  Neurological:     General: No focal deficit present.     Mental Status: She is alert and oriented to person, place, and time.  Psychiatric:        Attention and Perception: Attention and perception normal. She does not perceive auditory or visual hallucinations.        Mood and Affect: Mood and affect normal.        Speech: Speech normal.        Behavior: Behavior normal. Behavior is cooperative.        Thought Content: Thought content normal. Thought content is not paranoid or delusional. Thought content does not include homicidal or suicidal ideation.        Cognition and Memory: Cognition and memory normal.        Judgment: Judgment normal.    Review of Systems  Constitutional: Negative.   HENT: Negative.    Eyes: Negative.   Respiratory: Negative.    Cardiovascular: Negative.   Gastrointestinal: Negative.   Skin: Negative.   Neurological: Negative.   Psychiatric/Behavioral:  Negative for depression, hallucinations, substance abuse and suicidal ideas. The patient is not nervous/anxious and does not have insomnia.    Blood pressure 123/80, pulse (!) 103, temperature 97.8 F (36.6 C), temperature source Oral, resp. rate 18, SpO2 100 %. There is no height or weight on file to calculate BMI.  Treatment Plan Summary: Daily contact with patient to assess and evaluate symptoms and progress in treatment Patient remains cleared by psychiatry. Disposition is pending placement, transition of care staff pursuing disposition plan.  Meta Hatchet,  PA 09/14/2022 3:05 PM

## 2022-09-14 NOTE — ED Notes (Signed)
Pt continues to sleep quietly  breathing even and unlabored.  Staff will continue to monitor for safety.

## 2022-09-14 NOTE — ED Notes (Signed)
Pt laying in bed calm and cooperative. No c/o pain or distress. Will continue to monitor for safety 

## 2022-09-14 NOTE — ED Notes (Signed)
Pt is sleeping. No distress noted. Will continue to monitor safety. 

## 2022-09-15 MED ORDER — TRAZODONE HCL 100 MG PO TABS
100.0000 mg | ORAL_TABLET | Freq: Every day | ORAL | Status: DC
  Administered 2022-09-15 – 2022-10-06 (×22): 100 mg via ORAL
  Filled 2022-09-15 (×22): qty 1

## 2022-09-15 NOTE — ED Notes (Signed)
Patient alert and oriented x 4, denies SI, HI and AVH. Patient now on the phone with family member. Will continue to monitor for safety and update as needed

## 2022-09-15 NOTE — ED Notes (Signed)
Pt sleeping@this time. Breathing even and unlabored. Will continue to monitor for safety 

## 2022-09-15 NOTE — Progress Notes (Signed)
Pt presents with pleasant mood, affect congruent. Nicole [name] states that she is feeling ''constipated '' this am and reports small Bm but was harder ''than normal''  She denies any other acute concerns and is pleasant and joking with staff. She continues to require constant attention and frequently asks for staff reassurance/attention at times but has no behavioral issues thus far. She denies any SI HI or AV Hallucinations and remains boarding at [facility] for AFL placement.  Pt given coffee this am and encouraged po hydration to assist in complaints of constipation. Pt is safe, able to make needs known. Will con't to monitor.

## 2022-09-15 NOTE — ED Provider Notes (Addendum)
Behavioral Health Progress Note  Date and Time: 09/15/2022 4:42 PM Name: Nicole [name] MRN:  [id number]  Nicole [name] is a 31 year old female with a past psychiatric history significant for schizophrenia and major depressive disorder with psychosis who voluntarily presented to [facility] on 07/25/2022 via [facility] after a verbal altercation with [name] (not patient's legal guardian) for transient SI. This is patient's fourth visit to [facility] for similar concerns this year. Patient has been dismissed from previous group home due to threatening suicidal and homicidal ideations, then leaving the group home.   Legal Guardian: [name].  [name], 60 y.o., female patient seen face to face by this provider and chart reviewed on 09/15/22.   Upon evaluation Nicole [name] is observed sitting in her bed awake.  She has a bright affect and is pleasant upon approach.  She is alert/oriented x 4, calm, cooperative, and attentive.  She makes good eye contact. She has normal speech and behavior.  She is denying any depressive symptoms at this time.  She has a euthymic affect. She denies any concerns with appetite.  Reports she is having difficulty going to sleep and staying to sleep.  Reports the unit is loud and is disrupting her sleep.  She is requesting that her trazodone be increased. Will increase Trazodone to 100 mg QHS.  She is denying SI/HI/AVH.  She does not appear to be responding to internal/external stimuli.  She inquires about her placement and was educated that social work and DSS are working diligently and will update her with any progress.  She has remained calm and cooperative while on the unit and has required no as needed medications for agitation.  She is compliant with medications and appropriate with staff and other patients.  Patient remains psychiatrically cleared.  TOC and DSS continue to seek  placement.     Diagnosis:  Final diagnoses:  Severe episode of recurrent major depressive disorder, with psychotic features (HCC)    Total Time spent with patient: 30 minutes  Past Psychiatric History: see H&P Past Medical History:  Past Medical History:  Diagnosis Date   Anxiety    Depression    Hypothyroidism 08/07/2022   Tobacco use disorder 08/07/2022    Past Surgical History:  Procedure Laterality Date   WISDOM TOOTH EXTRACTION Bilateral 2020   Family History:  Family History  Problem Relation Age of Onset   Hypertension Father    Diabetes Father    Family Psychiatric  History: see H&P Social History:  Social History   Substance and Sexual Activity  Alcohol Use Never     Social History   Substance and Sexual Activity  Drug Use Never    Social History   Socioeconomic History   Marital status: Single    Spouse name: Not on file   Number of children: Not on file   Years of education: Not on file   Highest education level: Not on file  Occupational History   Not on file  Tobacco Use   Smoking status: Every Day    Types: Cigarettes   Smokeless tobacco: Never  Vaping Use   Vaping Use: Some days  Substance and Sexual Activity   Alcohol use: Never   Drug use: Never   Sexual activity: Yes    Partners: Female    Birth control/protection: Implant  Other Topics Concern   Not on file  Social History Narrative   Not on file  Social Determinants of Health   Financial Resource Strain: Not on file  Food Insecurity: Not on file  Transportation Needs: Not on file  Physical Activity: Not on file  Stress: Not on file  Social Connections: Not on file   SDOH:  SDOH Screenings   Depression (PHQ2-9): Low Risk  (06/10/2022)  Tobacco Use: High Risk (09/01/2022)   Additional Social History:    Pain Medications: SEE MAR Prescriptions: SEE MAR Over the Counter: SEE MAR History of alcohol / drug use?: Yes Longest period of sobriety (when/how long):  N/A Negative Consequences of Use:  (NONE) Withdrawal Symptoms: None Name of Substance 1: ETOH IN PAST--PT CURRENTLY DENIES 1 - Frequency: SOCIAL USE ETOH IN PAST 1 - Method of Aquiring: LEGAL 1- Route of Use: ORAL DRINK Name of Substance 2: NICOTINE-VAPE AND CIGARETTES 2 - Amount (size/oz): LESS THAN ONE PACK 2 - Frequency: DAILY 2 - Method of Aquiring: LEGAL 2 - Route of Substance Use: ORAL SMOKE                Sleep: Good  Appetite:  Good  Current Medications:  Current Facility-Administered Medications  Medication Dose Route Frequency Provider Last Rate Last Admin   acetaminophen (TYLENOL) tablet 650 mg  650 mg Oral Q6H PRN Onuoha, Chinwendu V, NP   650 mg at 09/12/22 2105   alum & mag hydroxide-simeth (MAALOX/MYLANTA) 200-200-20 MG/5ML suspension 30 mL  30 mL Oral Q4H PRN Onuoha, Chinwendu V, NP   30 mL at 07/27/22 2151   ARIPiprazole ER (ABILIFY MAINTENA) injection 400 mg  400 mg Intramuscular Q28 days Princess Bruins, DO   400 mg at 08/30/22 1338   docusate sodium (COLACE) capsule 100 mg  100 mg Oral Daily [name], MD   100 mg at 09/15/22 0842   hydrOXYzine (ATARAX) tablet 25 mg  25 mg Oral TID PRN Onuoha, Chinwendu V, NP   25 mg at 09/15/22 1043   ibuprofen (ADVIL) tablet 400 mg  400 mg Oral Q6H PRN Onuoha, Chinwendu V, NP   400 mg at 09/06/22 1314   levothyroxine (SYNTHROID) tablet 100 mcg  100 mcg Oral Q0600 Princess Bruins, DO   100 mcg at 09/15/22 0602   metFORMIN (GLUCOPHAGE) tablet 500 mg  500 mg Oral Q breakfast Park Pope, MD   500 mg at 09/15/22 0841   nicotine (NICODERM CQ - dosed in mg/24 hours) patch 14 mg  14 mg Transdermal Q0600 Onuoha, Chinwendu V, NP   14 mg at 09/15/22 0844   ondansetron (ZOFRAN-ODT) disintegrating tablet 4 mg  4 mg Oral Q8H PRN Sindy Guadeloupe, NP   4 mg at 09/14/22 2351   Oxcarbazepine (TRILEPTAL) tablet 300 mg  300 mg Oral BID Onuoha, Chinwendu V, NP   300 mg at 09/15/22 0842   pantoprazole (PROTONIX) EC tablet 20 mg  20 mg Oral Daily  Princess Bruins, DO   20 mg at 09/15/22 0842   QUEtiapine (SEROQUEL) tablet 400 mg  400 mg Oral BID Onuoha, Chinwendu V, NP   400 mg at 09/15/22 0842   sertraline (ZOLOFT) tablet 150 mg  150 mg Oral Daily Onuoha, Chinwendu V, NP   150 mg at 09/15/22 0842   traZODone (DESYREL) tablet 50 mg  50 mg Oral Seabron Spates, MD   50 mg at 09/14/22 2148   valACYclovir (VALTREX) tablet 500 mg  500 mg Oral Daily Princess Bruins, DO   500 mg at 09/15/22 0841   ziprasidone (GEODON) injection 20 mg  20 mg Intramuscular  Q12H PRN Onuoha, Chinwendu V, NP   20 mg at 09/13/22 2309   Current Outpatient Medications  Medication Sig Dispense Refill   ABILIFY MAINTENA 400 MG PRSY prefilled syringe 400 mg every 28 (twenty-eight) days.     cetirizine (ZYRTEC) 10 MG tablet Take 10 mg by mouth daily.     cyclobenzaprine (FLEXERIL) 10 MG tablet Take 1 tablet (10 mg total) by mouth 2 (two) times daily as needed for muscle spasms. 20 tablet 0   fluticasone (FLONASE) 50 MCG/ACT nasal spray Place 1 spray into both nostrils daily.     meloxicam (MOBIC) 15 MG tablet Take 15 mg by mouth daily.     Oxcarbazepine (TRILEPTAL) 300 MG tablet Take 1 tablet (300 mg total) by mouth 2 (two) times daily. 60 tablet 0   QUEtiapine (SEROQUEL) 400 MG tablet Take 1 tablet (400 mg total) by mouth 2 (two) times daily. 60 tablet 0   sertraline (ZOLOFT) 50 MG tablet Take 3 tablets (150 mg total) by mouth in the morning. 90 tablet 0   traZODone (DESYREL) 100 MG tablet Take 1 tablet (100 mg total) by mouth at bedtime. 30 tablet 0   valACYclovir (VALTREX) 500 MG tablet Take 500 mg by mouth daily.      Labs  Lab Results:  Admission on 07/25/2022  Component Date Value Ref Range Status   SARS Coronavirus 2 by RT PCR 07/25/2022 NEGATIVE  NEGATIVE Final   Comment: (NOTE) SARS-CoV-2 target nucleic acids are NOT DETECTED.  The SARS-CoV-2 RNA is generally detectable in upper respiratory specimens during the acute phase of infection. The  lowest concentration of SARS-CoV-2 viral copies this assay can detect is 138 copies/mL. A negative result does not preclude SARS-Cov-2 infection and should not be used as the sole basis for treatment or other patient management decisions. A negative result may occur with  improper specimen collection/handling, submission of specimen other than nasopharyngeal swab, presence of viral mutation(s) within the areas targeted by this assay, and inadequate number of viral copies(<138 copies/mL). A negative result must be combined with clinical observations, patient history, and epidemiological information. The expected result is Negative.  Fact Sheet for Patients:  BloggerCourse.com  Fact Sheet for Healthcare Providers:  SeriousBroker.it  This test is no                          t yet approved or cleared by the [place] FDA and  has been authorized for detection and/or diagnosis of SARS-CoV-2 by FDA under an Emergency Use Authorization (EUA). This EUA will remain  in effect (meaning this test can be used) for the duration of the COVID-19 declaration under Section 564(b)(1) of the Act, 21 U.S.C.section 360bbb-3(b)(1), unless the authorization is terminated  or revoked sooner.       Influenza A by PCR 07/25/2022 NEGATIVE  NEGATIVE Final   Influenza B by PCR 07/25/2022 NEGATIVE  NEGATIVE Final   Comment: (NOTE) The Xpert Xpress SARS-CoV-2/FLU/RSV plus assay is intended as an aid in the diagnosis of influenza from Nasopharyngeal swab specimens and should not be used as a sole basis for treatment. Nasal washings and aspirates are unacceptable for Xpert Xpress SARS-CoV-2/FLU/RSV testing.  Fact Sheet for Patients: BloggerCourse.com  Fact Sheet for Healthcare Providers: SeriousBroker.it  This test is not yet approved or cleared by the [place] FDA and has been authorized for  detection and/or diagnosis of SARS-CoV-2 by FDA under an Emergency Use Authorization (EUA). This EUA will remain  in effect (meaning this test can be used) for the duration of the COVID-19 declaration under Section 564(b)(1) of the Act, 21 U.S.C. section 360bbb-3(b)(1), unless the authorization is terminated or revoked.  Performed at [facility], [address], [place], [place] [address]    WBC 07/25/2022 8.3  4.0 - 10.5 K/uL Final   RBC 07/25/2022 4.44  3.87 - 5.11 MIL/uL Final   Hemoglobin 07/25/2022 13.7  12.0 - 15.0 g/dL Final   HCT 21/30/8657 40.2  36.0 - 46.0 % Final   MCV 07/25/2022 90.5  80.0 - 100.0 fL Final   MCH 07/25/2022 30.9  26.0 - 34.0 pg Final   MCHC 07/25/2022 34.1  30.0 - 36.0 g/dL Final   RDW 84/69/6295 12.2  11.5 - 15.5 % Final   Platelets 07/25/2022 248  150 - 400 K/uL Final   nRBC 07/25/2022 0.0  0.0 - 0.2 % Final   Neutrophils Relative % 07/25/2022 43  % Final   Neutro Abs 07/25/2022 3.6  1.7 - 7.7 K/uL Final   Lymphocytes Relative 07/25/2022 52  % Final   Lymphs Abs 07/25/2022 4.2 (H)  0.7 - 4.0 K/uL Final   Monocytes Relative 07/25/2022 5  % Final   Monocytes Absolute 07/25/2022 0.4  0.1 - 1.0 K/uL Final   Eosinophils Relative 07/25/2022 0  % Final   Eosinophils Absolute 07/25/2022 0.0  0.0 - 0.5 K/uL Final   Basophils Relative 07/25/2022 0  % Final   Basophils Absolute 07/25/2022 0.0  0.0 - 0.1 K/uL Final   Immature Granulocytes 07/25/2022 0  % Final   Abs Immature Granulocytes 07/25/2022 0.02  0.00 - 0.07 K/uL Final   Performed at [facility], [address] [place], [place] [address]   Sodium 07/25/2022 138  135 - 145 mmol/L Final   Potassium 07/25/2022 4.0  3.5 - 5.1 mmol/L Final   Chloride 07/25/2022 104  98 - 111 mmol/L Final   CO2 07/25/2022 29  22 - 32 mmol/L Final   Glucose, Bld 07/25/2022 83  70 - 99 mg/dL Final   Glucose reference range applies only to samples taken after fasting for at least 8 hours.   BUN 07/25/2022 11  6 -  20 mg/dL Final   Creatinine, Ser 07/25/2022 0.97  0.44 - 1.00 mg/dL Final   Calcium 24/40/1027 9.2  8.9 - 10.3 mg/dL Final   Total Protein 25/36/6440 7.0  6.5 - 8.1 g/dL Final   Albumin 34/74/2595 3.8  3.5 - 5.0 g/dL Final   AST 63/87/5643 18  15 - 41 U/L Final   ALT 07/25/2022 22  0 - 44 U/L Final   Alkaline Phosphatase 07/25/2022 64  38 - 126 U/L Final   Total Bilirubin 07/25/2022 0.2 (L)  0.3 - 1.2 mg/dL Final   GFR, Estimated 07/25/2022 >60  >60 mL/min Final   Comment: (NOTE) Calculated using the CKD-EPI Creatinine Equation (2021)    Anion gap 07/25/2022 5  5 - 15 Final   Performed at [facility], [address], [place], [place] [address]   POC Amphetamine UR 07/25/2022 None Detected  NONE DETECTED (Cut Off Level 1000 ng/mL) Preliminary   POC Secobarbital (BAR) 07/25/2022 None Detected  NONE DETECTED (Cut Off Level 300 ng/mL) Preliminary   POC Buprenorphine (BUP) 07/25/2022 None Detected  NONE DETECTED (Cut Off Level 10 ng/mL) Preliminary   POC Oxazepam (BZO) 07/25/2022 None Detected  NONE DETECTED (Cut Off Level 300 ng/mL) Preliminary   POC Cocaine UR  07/25/2022 None Detected  NONE DETECTED (Cut Off Level 300 ng/mL) Preliminary   POC Methamphetamine UR 07/25/2022 None Detected  NONE DETECTED (Cut Off Level 1000 ng/mL) Preliminary   POC Morphine 07/25/2022 None Detected  NONE DETECTED (Cut Off Level 300 ng/mL) Preliminary   POC Methadone UR 07/25/2022 None Detected  NONE DETECTED (Cut Off Level 300 ng/mL) Preliminary   POC Oxycodone UR 07/25/2022 None Detected  NONE DETECTED (Cut Off Level 100 ng/mL) Preliminary   POC Marijuana UR 07/25/2022 None Detected  NONE DETECTED (Cut Off Level 50 ng/mL) Preliminary   SARSCOV2ONAVIRUS 2 AG 07/25/2022 NEGATIVE  NEGATIVE Final   Comment: (NOTE) SARS-CoV-2 antigen NOT DETECTED.   Negative results are presumptive.  Negative results do not preclude SARS-CoV-2 infection and should not be used as the sole basis for treatment or other  patient management decisions, including infection  control decisions, particularly in the presence of clinical signs and  symptoms consistent with COVID-19, or in those who have been in contact with the virus.  Negative results must be combined with clinical observations, patient history, and epidemiological information. The expected result is Negative.  Fact Sheet for Patients: https://www.jennings-kim.com/  Fact Sheet for Healthcare Providers: https://alexander-rogers.biz/  This test is not yet approved or cleared by the [place] FDA and  has been authorized for detection and/or diagnosis of SARS-CoV-2 by FDA under an Emergency Use Authorization (EUA).  This EUA will remain in effect (meaning this test can be used) for the duration of  the COV                          ID-19 declaration under Section 564(b)(1) of the Act, 21 U.S.C. section 360bbb-3(b)(1), unless the authorization is terminated or revoked sooner.     Cholesterol 07/25/2022 181  0 - 200 mg/dL Final   Triglycerides 16/07/9603 118  <150 mg/dL Final   HDL 54/06/8118 45  >40 mg/dL Final   Total CHOL/HDL Ratio 07/25/2022 4.0  RATIO Final   VLDL 07/25/2022 24  0 - 40 mg/dL Final   LDL Cholesterol 07/25/2022 112 (H)  0 - 99 mg/dL Final   Comment:        Total Cholesterol/HDL:CHD Risk Coronary Heart Disease Risk Table                     Men   Women  1/2 Average Risk   3.4   3.3  Average Risk       5.0   4.4  2 X Average Risk   9.6   7.1  3 X Average Risk  23.4   11.0        Use the calculated Patient Ratio above and the CHD Risk Table to determine the patient's CHD Risk.        ATP III CLASSIFICATION (LDL):  <100     mg/dL   Optimal  147-829  mg/dL   Near or Above                    Optimal  130-159  mg/dL   Borderline  562-130  mg/dL   High  >865     mg/dL   Very High Performed at [facility], [address], [place], [place] [address]    TSH 07/25/2022 6.668 (H)  0.350 -  4.500 uIU/mL Final   Comment: Performed by a 3rd Generation assay with a functional sensitivity of <=0.01 uIU/mL. Performed at [facility], [address], [place], [place] [address]    Glucose-Capillary 07/26/2022 104 (H)  70 - 99 mg/dL Final   Glucose reference range applies only to samples taken after fasting for at least 8 hours.   T3, Free 07/31/2022 2.3  2.0 - 4.4 pg/mL Final   Comment: (NOTE) Performed At: [facility] [address] [place], [place] [id number] Jolene Schimke MD [id number]    Free T4 07/31/2022 0.60 (L)  0.61 - 1.12 ng/dL Final   Comment: (NOTE) Biotin ingestion may interfere with free T4 tests. If the results are inconsistent with the TSH level, previous test results, or the clinical presentation, then consider biotin interference. If needed, order repeat testing after stopping biotin. Performed at [facility], [address] [place], [place] [address]    Glucose-Capillary 08/29/2022 100 (H)  70 - 99 mg/dL Final   Glucose reference range applies only to samples taken after fasting for at least 8 hours.   TSH 09/05/2022 0.793  0.350 - 4.500 uIU/mL Final   Comment: Performed by a 3rd Generation assay with a functional sensitivity of <=0.01 uIU/mL. Performed at [facility], [address] [place], [place] [address]    Free T4 09/05/2022 0.73  0.61 - 1.12 ng/dL Final   Comment: (NOTE) Biotin ingestion may interfere with free T4 tests. If the results are inconsistent with the TSH level, previous test results, or the clinical presentation, then consider biotin interference. If needed, order repeat testing after stopping biotin. Performed at [facility], [address], [place], [place] [address]    Preg Test, Ur 09/06/2022 Negative  Negative Final   Preg Test, Ur 09/06/2022 NEGATIVE  NEGATIVE Final   Comment:        THE SENSITIVITY OF THIS METHODOLOGY IS >24 mIU/mL    Preg Test, Ur 09/05/2022 NEGATIVE  NEGATIVE  Final   Comment:        THE SENSITIVITY OF THIS METHODOLOGY IS >24 mIU/mL    Sodium 09/13/2022 136  135 - 145 mmol/L Final   Potassium 09/13/2022 4.5  3.5 - 5.1 mmol/L Final   Chloride 09/13/2022 105  98 - 111 mmol/L Final   CO2 09/13/2022 21 (L)  22 - 32 mmol/L Final   Glucose, Bld 09/13/2022 111 (H)  70 - 99 mg/dL Final   Glucose reference range applies only to samples taken after fasting for at least 8 hours.   BUN 09/13/2022 14  6 - 20 mg/dL Final   Creatinine, Ser 09/13/2022 0.92  0.44 - 1.00 mg/dL Final   Calcium 84/13/2440 9.1  8.9 - 10.3 mg/dL Final   GFR, Estimated 09/13/2022 >60  >60 mL/min Final   Comment: (NOTE) Calculated using the CKD-EPI Creatinine Equation (2021)    Anion gap 09/13/2022 10  5 - 15 Final   Performed at [facility], [address], [place], [place] [address]   Vitamin B-12 09/13/2022 585  180 - 914 pg/mL Final   Comment: (NOTE) This assay is not validated for testing neonatal or myeloproliferative syndrome specimens for Vitamin B12 levels. Performed at [facility], [address] [place], [place] [address]    Color, Urine 09/14/2022 YELLOW  YELLOW Final   APPearance 09/14/2022 HAZY (A)  CLEAR Final   Specific Gravity, Urine 09/14/2022 1.025  1.005 - 1.030 Final   pH 09/14/2022 5.0  5.0 - 8.0 Final   Glucose, UA 09/14/2022 NEGATIVE  NEGATIVE mg/dL Final   Hgb  urine dipstick 09/14/2022 NEGATIVE  NEGATIVE Final   Bilirubin Urine 09/14/2022 NEGATIVE  NEGATIVE Final   Ketones, ur 09/14/2022 NEGATIVE  NEGATIVE mg/dL Final   Protein, ur 84/66/5993 NEGATIVE  NEGATIVE mg/dL Final   Nitrite 57/10/7791 NEGATIVE  NEGATIVE Final   Leukocytes,Ua 09/14/2022 TRACE (A)  NEGATIVE Final   RBC / HPF 09/14/2022 0-5  0 - 5 RBC/hpf Final   WBC, UA 09/14/2022 0-5  0 - 5 WBC/hpf Final   Bacteria, UA 09/14/2022 RARE (A)  NONE SEEN Final   Squamous Epithelial / LPF 09/14/2022 0-5  0 - 5 Final   Mucus 09/14/2022 PRESENT   Final   Performed at [facility], [address] [place], [place] [address]  Admission on 07/04/2022, Discharged on 07/05/2022  Component Date Value Ref Range Status   Sodium 07/04/2022 142  135 - 145 mmol/L Final   Potassium 07/04/2022 4.4  3.5 - 5.1 mmol/L Final   Chloride 07/04/2022 109  98 - 111 mmol/L Final   CO2 07/04/2022 26  22 - 32 mmol/L Final   Glucose, Bld 07/04/2022 96  70 - 99 mg/dL Final   Glucose reference range applies only to samples taken after fasting for at least 8 hours.   BUN 07/04/2022 15  6 - 20 mg/dL Final   Creatinine, Ser 07/04/2022 0.83  0.44 - 1.00 mg/dL Final   Calcium 92/33/0076 9.3  8.9 - 10.3 mg/dL Final   Total Protein 22/63/3354 7.2  6.5 - 8.1 g/dL Final   Albumin 56/25/6389 3.9  3.5 - 5.0 g/dL Final   AST 37/34/2876 23  15 - 41 U/L Final   ALT 07/04/2022 28  0 - 44 U/L Final   Alkaline Phosphatase 07/04/2022 77  38 - 126 U/L Final   Total Bilirubin 07/04/2022 0.4  0.3 - 1.2 mg/dL Final   GFR, Estimated 07/04/2022 >60  >60 mL/min Final   Comment: (NOTE) Calculated using the CKD-EPI Creatinine Equation (2021)    Anion gap 07/04/2022 7  5 - 15 Final   Performed at [facility], [address] [place], [place] [address]   Alcohol, Ethyl (B) 07/04/2022 <10  <10 mg/dL Final   Comment: (NOTE) Lowest detectable limit for serum alcohol is 10 mg/dL.  For medical purposes only. Performed at [facility], [address], [place], [place] [address]    WBC 07/04/2022 7.0  4.0 - 10.5 K/uL Final   RBC 07/04/2022 4.18  3.87 - 5.11 MIL/uL Final   Hemoglobin 07/04/2022 12.8  12.0 - 15.0 g/dL Final   HCT 55/97/4163 39.1  36.0 - 46.0 % Final   MCV 07/04/2022 93.5  80.0 - 100.0 fL Final   MCH 07/04/2022 30.6  26.0 - 34.0 pg Final   MCHC 07/04/2022 32.7  30.0 - 36.0 g/dL Final   RDW 84/53/6468 12.9  11.5 - 15.5 % Final   Platelets 07/04/2022 243  150 - 400 K/uL Final   nRBC 07/04/2022 0.0  0.0 - 0.2 % Final   Neutrophils Relative % 07/04/2022 43   % Final   Neutro Abs 07/04/2022 3.0  1.7 - 7.7 K/uL Final   Lymphocytes Relative 07/04/2022 50  % Final   Lymphs Abs 07/04/2022 3.5  0.7 - 4.0 K/uL Final   Monocytes Relative 07/04/2022 7  % Final   Monocytes Absolute 07/04/2022 0.5  0.1 - 1.0 K/uL Final   Eosinophils Relative 07/04/2022 0  % Final   Eosinophils Absolute 07/04/2022 0.0  0.0 -  0.5 K/uL Final   Basophils Relative 07/04/2022 0  % Final   Basophils Absolute 07/04/2022 0.0  0.0 - 0.1 K/uL Final   Immature Granulocytes 07/04/2022 0  % Final   Abs Immature Granulocytes 07/04/2022 0.01  0.00 - 0.07 K/uL Final   Performed at [facility], [address], [place], [place] [address]   I-stat hCG, quantitative 07/04/2022 <5.0  <5 mIU/mL Final   Comment 3 07/04/2022          Final   Comment:   GEST. AGE      CONC.  (mIU/mL)   <=1 WEEK        5 - 50     2 WEEKS       50 - 500     3 WEEKS       100 - 10,000     4 WEEKS     1,000 - 30,000        FEMALE AND NON-PREGNANT FEMALE:     LESS THAN 5 mIU/mL   Admission on 06/14/2022, Discharged on 06/14/2022  Component Date Value Ref Range Status   Color, UA 06/14/2022 yellow  yellow Final   Clarity, UA 06/14/2022 cloudy (A)  clear Final   Glucose, UA 06/14/2022 negative  negative mg/dL Final   Bilirubin, UA 60/45/4098 negative  negative Final   Ketones, POC UA 06/14/2022 negative  negative mg/dL Final   Spec Grav, UA 11/91/4782 1.025  1.010 - 1.025 Final   Blood, UA 06/14/2022 negative  negative Final   pH, UA 06/14/2022 7.5  5.0 - 8.0 Final   Protein Ur, POC 06/14/2022 =30 (A)  negative mg/dL Final   Urobilinogen, UA 06/14/2022 0.2  0.2 or 1.0 E.U./dL Final   Nitrite, UA 95/62/1308 Negative  Negative Final   Leukocytes, UA 06/14/2022 Negative  Negative Final   Preg Test, Ur 06/14/2022 Negative  Negative Final   Specimen Description 06/14/2022 URINE, CLEAN CATCH   Final   Special Requests 06/14/2022    Final                   Value:NONE Performed at [facility], [address], [place], [place] [address]    Culture 06/14/2022 MULTIPLE SPECIES PRESENT, SUGGEST RECOLLECTION (A)   Final   Report Status 06/14/2022 06/16/2022 FINAL   Final  Admission on 06/07/2022, Discharged on 06/10/2022  Component Date Value Ref Range Status   SARS Coronavirus 2 by RT PCR 06/07/2022 NEGATIVE  NEGATIVE Final   Comment: (NOTE) SARS-CoV-2 target nucleic acids are NOT DETECTED.  The SARS-CoV-2 RNA is generally detectable in upper respiratory specimens during the acute phase of infection. The lowest concentration of SARS-CoV-2 viral copies this assay can detect is 138 copies/mL. A negative result does not preclude SARS-Cov-2 infection and should not be used as the sole basis for treatment or other patient management decisions. A negative result may occur with  improper specimen collection/handling, submission of specimen other than nasopharyngeal swab, presence of viral mutation(s) within the areas targeted by this assay, and inadequate number of viral copies(<138 copies/mL). A negative result must be combined with clinical observations, patient history, and epidemiological information. The expected result is Negative.  Fact Sheet for Patients:  BloggerCourse.com  Fact Sheet for Healthcare Providers:  SeriousBroker.it  This test is no                          t yet approved or cleared  by the [place] FDA and  has been authorized for detection and/or diagnosis of SARS-CoV-2 by FDA under an Emergency Use Authorization (EUA). This EUA will remain  in effect (meaning this test can be used) fQataror the duration of the COVID-19 declaration under Section 564(b)(1) of the Act, 21 U.S.C.section 360bbb-3(b)(1), unless the authorization is terminated  or revoked sooner.       Influenza A by PCR 06/07/2022 NEGATIVE  NEGATIVE Final   Influenza B by PCR 06/07/2022 NEGATIVE  NEGATIVE Final   Comment: (NOTE) The  Xpert Xpress SARS-CoV-2/FLU/RSV plus assay is intended as an aid in the diagnosis of influenza from Nasopharyngeal swab specimens and should not be used as a sole basis for treatment. Nasal washings and aspirates are unacceptable for Xpert Xpress SARS-CoV-2/FLU/RSV testing.  Fact Sheet for Patients: BloggerCourse.comhttps://www.fda.gov/media/152166/download  Fact Sheet for Healthcare Providers: SeriousBroker.ithttps://www.fda.gov/media/152162/download  This test is not yet approved or cleared by the [place] FDA and has been authorized for detection and/or diagnosis of SARS-CoV-2 by FDA under an Emergency Use Authorization (EUA). This EUA will remain in effect (meaning this test can be used) for the duration of the COVID-19 declaration under Section 564(b)(1) of the Act, 21 U.S.C. section 360bbb-3(b)(1), unless the authorization is terminated or revoked.  Performed at [facility], [address] [place], [place] [id number]    WBC 06/07/2022 5.7  4.0 - 10.5 K/uL Final   RBC 06/07/2022 4.39  3.87 - 5.11 MIL/uL Final   Hemoglobin 06/07/2022 13.2  12.0 - 15.0 g/dL Final   HCT 37/62/831508/25/2023 40.4  36.0 - 46.0 % Final   MCV 06/07/2022 92.0  80.0 - 100.0 fL Final   MCH 06/07/2022 30.1  26.0 - 34.0 pg Final   MCHC 06/07/2022 32.7  30.0 - 36.0 g/dL Final   RDW 17/61/607308/25/2023 12.4  11.5 - 15.5 % Final   Platelets 06/07/2022 308  150 - 400 K/uL Final   nRBC 06/07/2022 0.0  0.0 - 0.2 % Final   Neutrophils Relative % 06/07/2022 42  % Final   Neutro Abs 06/07/2022 2.4  1.7 - 7.7 K/uL Final   Lymphocytes Relative 06/07/2022 54  % Final   Lymphs Abs 06/07/2022 3.1  0.7 - 4.0 K/uL Final   Monocytes Relative 06/07/2022 4  % Final   Monocytes Absolute 06/07/2022 0.2  0.1 - 1.0 K/uL Final   Eosinophils Relative 06/07/2022 0  % Final   Eosinophils Absolute 06/07/2022 0.0  0.0 - 0.5 K/uL Final   Basophils Relative 06/07/2022 0  % Final   Basophils Absolute 06/07/2022 0.0  0.0 - 0.1 K/uL Final   Immature Granulocytes  06/07/2022 0  % Final   Abs Immature Granulocytes 06/07/2022 0.01  0.00 - 0.07 K/uL Final   Performed at [facility], [address] [place], [place] [address]   Sodium 06/07/2022 139  135 - 145 mmol/L Final   Potassium 06/07/2022 4.0  3.5 - 5.1 mmol/L Final   Chloride 06/07/2022 104  98 - 111 mmol/L Final   CO2 06/07/2022 28  22 - 32 mmol/L Final   Glucose, Bld 06/07/2022 104 (H)  70 - 99 mg/dL Final   Glucose reference range applies only to samples taken after fasting for at least 8 hours.   BUN 06/07/2022 8  6 - 20 mg/dL Final   Creatinine, Ser 06/07/2022 0.84  0.44 - 1.00 mg/dL Final   Calcium 69/48/546208/25/2023 9.1  8.9 - 10.3 mg/dL Final   Total Protein 70/35/009308/25/2023 6.9  6.5 - 8.1 g/dL Final   Albumin 13/24/4010 3.7  3.5 - 5.0 g/dL Final   AST 27/25/3664 19  15 - 41 U/L Final   ALT 06/07/2022 22  0 - 44 U/L Final   Alkaline Phosphatase 06/07/2022 54  38 - 126 U/L Final   Total Bilirubin 06/07/2022 0.4  0.3 - 1.2 mg/dL Final   GFR, Estimated 06/07/2022 >60  >60 mL/min Final   Comment: (NOTE) Calculated using the CKD-EPI Creatinine Equation (2021)    Anion gap 06/07/2022 7  5 - 15 Final   Performed at [facility], [address], [place], [place] [address]   Hgb A1c MFr Bld 06/07/2022 5.0  4.8 - 5.6 % Final   Comment: (NOTE) Pre diabetes:          5.7%-6.4%  Diabetes:              >6.4%  Glycemic control for   <7.0% adults with diabetes    Mean Plasma Glucose 06/07/2022 96.8  mg/dL Final   Performed at [facility], [address], [place], [place] [address]   TSH 06/07/2022 1.620  0.350 - 4.500 uIU/mL Final   Comment: Performed by a 3rd Generation assay with a functional sensitivity of <=0.01 uIU/mL. Performed at [facility], [address] [place], [place] [address]    RPR Ser Ql 06/07/2022 NON REACTIVE  NON REACTIVE Final   Performed at [facility], [address] [place], [place] [address]   Color, Urine 06/07/2022 YELLOW  YELLOW Final    APPearance 06/07/2022 HAZY (A)  CLEAR Final   Specific Gravity, Urine 06/07/2022 1.018  1.005 - 1.030 Final   pH 06/07/2022 7.0  5.0 - 8.0 Final   Glucose, UA 06/07/2022 NEGATIVE  NEGATIVE mg/dL Final   Hgb urine dipstick 06/07/2022 NEGATIVE  NEGATIVE Final   Bilirubin Urine 06/07/2022 NEGATIVE  NEGATIVE Final   Ketones, ur 06/07/2022 NEGATIVE  NEGATIVE mg/dL Final   Protein, ur 95/18/8416 NEGATIVE  NEGATIVE mg/dL Final   Nitrite 60/63/0160 NEGATIVE  NEGATIVE Final   Leukocytes,Ua 06/07/2022 NEGATIVE  NEGATIVE Final   Performed at [facility], [address], [place], [place] [address]   Cholesterol 06/07/2022 164  0 - 200 mg/dL Final   Triglycerides 35/57/3220 158 (H)  <150 mg/dL Final   HDL 25/42/7062 44  >40 mg/dL Final   Total CHOL/HDL Ratio 06/07/2022 3.7  RATIO Final   VLDL 06/07/2022 32  0 - 40 mg/dL Final   LDL Cholesterol 06/07/2022 88  0 - 99 mg/dL Final   Comment:        Total Cholesterol/HDL:CHD Risk Coronary Heart Disease Risk Table                     Men   Women  1/2 Average Risk   3.4   3.3  Average Risk       5.0   4.4  2 X Average Risk   9.6   7.1  3 X Average Risk  23.4   11.0        Use the calculated Patient Ratio above and the CHD Risk Table to determine the patient's CHD Risk.        ATP III CLASSIFICATION (LDL):  <100     mg/dL   Optimal  376-283  mg/dL   Near or Above  Optimal  130-159  mg/dL   Borderline  161-096  mg/dL   High  >045     mg/dL   Very High Performed at [facility], [address], [place], [place] [address]    HIV Screen 4th Generation wRfx 06/07/2022 Non Reactive  Non Reactive Final   Performed at [facility], [address] [place], [place] [address]   SARSCOV2ONAVIRUS 2 AG 06/07/2022 NEGATIVE  NEGATIVE Final   Comment: (NOTE) SARS-CoV-2 antigen NOT DETECTED.   Negative results are presumptive.  Negative results do not preclude SARS-CoV-2 infection and should not be used as the sole basis  for treatment or other patient management decisions, including infection  control decisions, particularly in the presence of clinical signs and  symptoms consistent with COVID-19, or in those who have been in contact with the virus.  Negative results must be combined with clinical observations, patient history, and epidemiological information. The expected result is Negative.  Fact Sheet for Patients: https://www.jennings-kim.com/  Fact Sheet for Healthcare Providers: https://alexander-rogers.biz/  This test is not yet approved or cleared by the [place] FDA and  has been authorized for detection and/or diagnosis of SARS-CoV-2 by FDA under an Emergency Use Authorization (EUA).  This EUA will remain in effect (meaning this test can be used) for the duration of  the COV                          ID-19 declaration under Section 564(b)(1) of the Act, 21 U.S.C. section 360bbb-3(b)(1), unless the authorization is terminated or revoked sooner.     POC Amphetamine UR 06/07/2022 None Detected  NONE DETECTED (Cut Off Level 1000 ng/mL) Final   POC Secobarbital (BAR) 06/07/2022 None Detected  NONE DETECTED (Cut Off Level 300 ng/mL) Final   POC Buprenorphine (BUP) 06/07/2022 None Detected  NONE DETECTED (Cut Off Level 10 ng/mL) Final   POC Oxazepam (BZO) 06/07/2022 None Detected  NONE DETECTED (Cut Off Level 300 ng/mL) Final   POC Cocaine UR 06/07/2022 None Detected  NONE DETECTED (Cut Off Level 300 ng/mL) Final   POC Methamphetamine UR 06/07/2022 None Detected  NONE DETECTED (Cut Off Level 1000 ng/mL) Final   POC Morphine 06/07/2022 None Detected  NONE DETECTED (Cut Off Level 300 ng/mL) Final   POC Methadone UR 06/07/2022 None Detected  NONE DETECTED (Cut Off Level 300 ng/mL) Final   POC Oxycodone UR 06/07/2022 None Detected  NONE DETECTED (Cut Off Level 100 ng/mL) Final   POC Marijuana UR 06/07/2022 None Detected  NONE DETECTED (Cut Off Level 50 ng/mL) Final   Preg  Test, Ur 06/08/2022 NEGATIVE  NEGATIVE Final   Comment:        THE SENSITIVITY OF THIS METHODOLOGY IS >20 mIU/mL. Performed at [facility], [address] [place], [place] [address]    Preg Test, Ur 06/08/2022 NEGATIVE  NEGATIVE Final   Comment:        THE SENSITIVITY OF THIS METHODOLOGY IS >24 mIU/mL     Blood Alcohol level:  Lab Results  Component Value Date   ETH <10 07/04/2022   ETH <10 02/07/2022    Metabolic Disorder Labs: Lab Results  Component Value Date   HGBA1C 5.0 06/07/2022   MPG 96.8 06/07/2022   MPG 96.8 02/07/2022   No results found for: "PROLACTIN" Lab Results  Component Value Date   CHOL 181 07/25/2022   TRIG 118 07/25/2022   HDL 45 07/25/2022  CHOLHDL 4.0 07/25/2022   VLDL 24 07/25/2022   LDLCALC 112 (H) 07/25/2022   LDLCALC 88 06/07/2022    Therapeutic Lab Levels: No results found for: "LITHIUM" No results found for: "VALPROATE" No results found for: "CBMZ"  Physical Findings   GAD-7    Flowsheet Row [facility] 12/17/2021 [facility]  Total GAD-7 Score 17      PHQ2-9    Flowsheet Row ED from 06/07/2022 in [facility] Office Visit from 12/17/2021 [facility]  PHQ-2 Total Score 1 2  PHQ-9 Total Score 2 8      Flowsheet Row ED from 07/25/2022 in [facility] ED from 07/04/2022 in [facility] from 06/14/2022 in [facility]   C-SSRS RISK CATEGORY High Risk No Risk No Risk        Musculoskeletal  Strength & Muscle Tone: within normal limits Gait & Station: normal Patient leans: N/A  Psychiatric Specialty Exam  Presentation  General Appearance:  Appropriate for Environment; Casual  Eye Contact: Good  Speech: Clear and Coherent; Normal Rate  Speech Volume: Normal  Handedness: Right   Mood and Affect   Mood: Euthymic  Affect: Congruent   Thought Process  Thought Processes: Coherent  Descriptions of Associations:Intact  Orientation:Full (Time, Place and Person)  Thought Content:Logical  Diagnosis of Schizophrenia or Schizoaffective disorder in past: Yes  Duration of Psychotic Symptoms: Greater than six months   Hallucinations:Hallucinations: None  Ideas of Reference:None  Suicidal Thoughts:Suicidal Thoughts: No  Homicidal Thoughts:Homicidal Thoughts: No   Sensorium  Memory: Immediate Fair; Recent Fair; Remote Fair  Judgment: Fair  Insight: Fair   Art therapist  Concentration: Good  Attention Span: Good  Recall: Good  Fund of Knowledge: Good  Language: Good   Psychomotor Activity  Psychomotor Activity: Psychomotor Activity: Normal   Assets  Assets: Communication Skills; Desire for Improvement; Physical Health; Resilience; Social Support   Sleep  Sleep: Sleep: Good   No data recorded  Physical Exam  Physical Exam Vitals and nursing note reviewed.  Constitutional:      General: She is not in acute distress.    Appearance: Normal appearance. She is not ill-appearing.  HENT:     Head: Normocephalic.  Eyes:     General:        Right eye: No discharge.        Left eye: No discharge.     Conjunctiva/sclera: Conjunctivae normal.  Cardiovascular:     Rate and Rhythm: Normal rate.  Pulmonary:     Effort: Pulmonary effort is normal.  Musculoskeletal:        General: Normal range of motion.     Cervical back: Normal range of motion.  Skin:    General: Skin is warm and dry.  Neurological:     Mental Status: She is alert and oriented to person, place, and time.  Psychiatric:        Attention and Perception: Attention and perception normal.        Mood and Affect: Affect normal.        Speech: Speech normal.        Behavior: Behavior normal. Behavior is cooperative.        Thought Content: Thought content normal.         Cognition and Memory: Cognition normal.        Judgment: Judgment normal.  Review of Systems  Constitutional: Negative.   HENT: Negative.    Eyes: Negative.   Respiratory: Negative.    Cardiovascular: Negative.   Musculoskeletal: Negative.   Skin: Negative.   Neurological: Negative.   Psychiatric/Behavioral: Negative.     Blood pressure 107/66, pulse (!) 111, temperature 98.4 F (36.9 C), temperature source Oral, resp. rate 15, SpO2 100 %. There is no height or weight on file to calculate BMI.  Treatment Plan Summary:  Will continue to have daily contact with patient to assess and evaluate symptoms and progress in treatment.   Patient remains cleared by psychiatry. Disposition is pending placement, transition of care staff pursuing disposition plan.  Increase Trazodone to 100 mg QHS.   Ardis Hughs, NP 09/15/2022 4:42 PM

## 2022-09-16 DIAGNOSIS — F333 Major depressive disorder, recurrent, severe with psychotic symptoms: Secondary | ICD-10-CM | POA: Diagnosis not present

## 2022-09-16 LAB — GLUCOSE, CAPILLARY: Glucose-Capillary: 105 mg/dL — ABNORMAL HIGH (ref 70–99)

## 2022-09-16 NOTE — ED Notes (Signed)
Pt c/o leg cramping and pain.  Reports her legs get like this when her sugar is high.  CBG checked and is 105.  Prn medications provided to assist with pain.

## 2022-09-16 NOTE — ED Provider Notes (Signed)
Behavioral Health Progress Note  Date and Time: 09/16/2022 10:11 AM Name: Nicole [name] MRN:  [id number]    HPI: Nicole [name] is a 31 year old female with a past psychiatric history significant for schizophrenia and major depressive disorder with psychosis who voluntarily presented to [facility] on 07/25/2022 via [facility] after a verbal altercation with [name] (not patient's legal guardian) for transient SI. This is patient's fourth visit to [facility] for similar concerns this year. Patient has been dismissed from previous group home due to threatening suicidal and homicidal ideations, then leaving the group home.   Legal Guardian: Nicole [name]) transitioning to be Nicole [name]) Point of contact: Nicole [name]).   [name], 31 y.o., female patient seen face to face by this provider and chart reviewed on 09/16/22.   During evaluation Nicole [name] is laying in her bed asleep in no acute distress. She is easily awakened.  Reports that she slept well and tolerated the trazodone without any adverse reactions.  She is alert/oriented x 4, cooperative, calm and attentive.  She has normal speech and behavior.  She continues to deny depression, anxiety, SI/HI/AVH.  She does not appear to be responding to internal/external stimuli.  She is requesting to speak to the social worker concerning her placement.  States she does not understand why he is not getting placed anywhere.  Provided reassurance, encouragement and support.        Diagnosis:  Final diagnoses:  Severe episode of recurrent major depressive disorder, with psychotic features (HCC)    Total Time spent with patient: 30 minutes  Past Psychiatric History: see H&P Past Medical History:  Past Medical History:  Diagnosis Date   Anxiety    Depression    Hypothyroidism 08/07/2022   Tobacco use disorder 08/07/2022    Past Surgical History:  Procedure Laterality Date   WISDOM TOOTH EXTRACTION Bilateral 2020   Family  History:  Family History  Problem Relation Age of Onset   Hypertension Father    Diabetes Father    Family Psychiatric  History: see H&P Social History:  Social History   Substance and Sexual Activity  Alcohol Use Never     Social History   Substance and Sexual Activity  Drug Use Never    Social History   Socioeconomic History   Marital status: Single    Spouse name: Not on file   Number of children: Not on file   Years of education: Not on file   Highest education level: Not on file  Occupational History   Not on file  Tobacco Use   Smoking status: Every Day    Types: Cigarettes   Smokeless tobacco: Never  Vaping Use   Vaping Use: Some days  Substance and Sexual Activity   Alcohol use: Never   Drug use: Never   Sexual activity: Yes    Partners: Female    Birth control/protection: Implant  Other Topics Concern   Not on file  Social History Narrative   Not on file   Social Determinants of Health   Financial Resource Strain: Not on file  Food Insecurity: Not on file  Transportation Needs: Not on file  Physical Activity: Not on file  Stress: Not on file  Social Connections: Not on file   SDOH:  SDOH Screenings   Depression (PHQ2-9): Low Risk  (06/10/2022)  Tobacco Use: High Risk (09/01/2022)   Additional Social History:    Pain Medications: SEE MAR Prescriptions: SEE MAR Over the Counter: SEE MAR History of  alcohol / drug use?: Yes Longest period of sobriety (when/how long): N/A Negative Consequences of Use:  (NONE) Withdrawal Symptoms: None Name of Substance 1: ETOH IN PAST--PT CURRENTLY DENIES 1 - Frequency: SOCIAL USE ETOH IN PAST 1 - Method of Aquiring: LEGAL 1- Route of Use: ORAL DRINK Name of Substance 2: NICOTINE-VAPE AND CIGARETTES 2 - Amount (size/oz): LESS THAN ONE PACK 2 - Frequency: DAILY 2 - Method of Aquiring: LEGAL 2 - Route of Substance Use: ORAL SMOKE                Sleep: Good  Appetite:  Good  Current  Medications:  Current Facility-Administered Medications  Medication Dose Route Frequency Provider Last Rate Last Admin   acetaminophen (TYLENOL) tablet 650 mg  650 mg Oral Q6H PRN Onuoha, Chinwendu V, NP   650 mg at 09/12/22 2105   alum & mag hydroxide-simeth (MAALOX/MYLANTA) 200-200-20 MG/5ML suspension 30 mL  30 mL Oral Q4H PRN Onuoha, Chinwendu V, NP   30 mL at 07/27/22 2151   ARIPiprazole ER (ABILIFY MAINTENA) injection 400 mg  400 mg Intramuscular Q28 days Princess Bruins, DO   400 mg at 08/30/22 1338   docusate sodium (COLACE) capsule 100 mg  100 mg Oral Daily [name], MD   100 mg at 09/16/22 0927   hydrOXYzine (ATARAX) tablet 25 mg  25 mg Oral TID PRN Onuoha, Chinwendu V, NP   25 mg at 09/15/22 2115   ibuprofen (ADVIL) tablet 400 mg  400 mg Oral Q6H PRN Onuoha, Chinwendu V, NP   400 mg at 09/06/22 1314   levothyroxine (SYNTHROID) tablet 100 mcg  100 mcg Oral Q0600 Princess Bruins, DO   100 mcg at 09/16/22 1610   metFORMIN (GLUCOPHAGE) tablet 500 mg  500 mg Oral Q breakfast Park Pope, MD   500 mg at 09/16/22 9604   nicotine (NICODERM CQ - dosed in mg/24 hours) patch 14 mg  14 mg Transdermal Q0600 Onuoha, Chinwendu V, NP   14 mg at 09/16/22 0928   ondansetron (ZOFRAN-ODT) disintegrating tablet 4 mg  4 mg Oral Q8H PRN Sindy Guadeloupe, NP   4 mg at 09/14/22 2351   Oxcarbazepine (TRILEPTAL) tablet 300 mg  300 mg Oral BID Onuoha, Chinwendu V, NP   300 mg at 09/16/22 0927   pantoprazole (PROTONIX) EC tablet 20 mg  20 mg Oral Daily Princess Bruins, DO   20 mg at 09/16/22 5409   QUEtiapine (SEROQUEL) tablet 400 mg  400 mg Oral BID Onuoha, Chinwendu V, NP   400 mg at 09/16/22 0927   sertraline (ZOLOFT) tablet 150 mg  150 mg Oral Daily Onuoha, Chinwendu V, NP   150 mg at 09/16/22 8119   traZODone (DESYREL) tablet 100 mg  100 mg Oral QHS Ardis Hughs, NP   100 mg at 09/15/22 2115   valACYclovir (VALTREX) tablet 500 mg  500 mg Oral Daily Princess Bruins, DO   500 mg at 09/16/22 1478   ziprasidone  (GEODON) injection 20 mg  20 mg Intramuscular Q12H PRN Onuoha, Chinwendu V, NP   20 mg at 09/13/22 2309   Current Outpatient Medications  Medication Sig Dispense Refill   ABILIFY MAINTENA 400 MG PRSY prefilled syringe 400 mg every 28 (twenty-eight) days.     cetirizine (ZYRTEC) 10 MG tablet Take 10 mg by mouth daily.     cyclobenzaprine (FLEXERIL) 10 MG tablet Take 1 tablet (10 mg total) by mouth 2 (two) times daily as needed for muscle spasms. 20 tablet 0  fluticasone (FLONASE) 50 MCG/ACT nasal spray Place 1 spray into both nostrils daily.     meloxicam (MOBIC) 15 MG tablet Take 15 mg by mouth daily.     Oxcarbazepine (TRILEPTAL) 300 MG tablet Take 1 tablet (300 mg total) by mouth 2 (two) times daily. 60 tablet 0   QUEtiapine (SEROQUEL) 400 MG tablet Take 1 tablet (400 mg total) by mouth 2 (two) times daily. 60 tablet 0   sertraline (ZOLOFT) 50 MG tablet Take 3 tablets (150 mg total) by mouth in the morning. 90 tablet 0   traZODone (DESYREL) 100 MG tablet Take 1 tablet (100 mg total) by mouth at bedtime. 30 tablet 0   valACYclovir (VALTREX) 500 MG tablet Take 500 mg by mouth daily.      Labs  Lab Results:  Admission on 07/25/2022  Component Date Value Ref Range Status   SARS Coronavirus 2 by RT PCR 07/25/2022 NEGATIVE  NEGATIVE Final   Comment: (NOTE) SARS-CoV-2 target nucleic acids are NOT DETECTED.  The SARS-CoV-2 RNA is generally detectable in upper respiratory specimens during the acute phase of infection. The lowest concentration of SARS-CoV-2 viral copies this assay can detect is 138 copies/mL. A negative result does not preclude SARS-Cov-2 infection and should not be used as the sole basis for treatment or other patient management decisions. A negative result may occur with  improper specimen collection/handling, submission of specimen other than nasopharyngeal swab, presence of viral mutation(s) within the areas targeted by this assay, and inadequate number of  viral copies(<138 copies/mL). A negative result must be combined with clinical observations, patient history, and epidemiological information. The expected result is Negative.  Fact Sheet for Patients:  BloggerCourse.com  Fact Sheet for Healthcare Providers:  SeriousBroker.it  This test is no                          t yet approved or cleared by the [place] FDA and  has been authorized for detection and/or diagnosis of SARS-CoV-2 by FDA under an Emergency Use Authorization (EUA). This EUA will remain  in effect (meaning this test can be used) for the duration of the COVID-19 declaration under Section 564(b)(1) of the Act, 21 U.S.C.section 360bbb-3(b)(1), unless the authorization is terminated  or revoked sooner.       Influenza A by PCR 07/25/2022 NEGATIVE  NEGATIVE Final   Influenza B by PCR 07/25/2022 NEGATIVE  NEGATIVE Final   Comment: (NOTE) The Xpert Xpress SARS-CoV-2/FLU/RSV plus assay is intended as an aid in the diagnosis of influenza from Nasopharyngeal swab specimens and should not be used as a sole basis for treatment. Nasal washings and aspirates are unacceptable for Xpert Xpress SARS-CoV-2/FLU/RSV testing.  Fact Sheet for Patients: BloggerCourse.com  Fact Sheet for Healthcare Providers: SeriousBroker.it  This test is not yet approved or cleared by the [place] FDA and has been authorized for detection and/or diagnosis of SARS-CoV-2 by FDA under an Emergency Use Authorization (EUA). This EUA will remain in effect (meaning this test can be used) for the duration of the COVID-19 declaration under Section 564(b)(1) of the Act, 21 U.S.C. section 360bbb-3(b)(1), unless the authorization is terminated or revoked.  Performed at [facility], [address] [place], [place] [address]    WBC 07/25/2022 8.3  4.0 - 10.5 K/uL Final   RBC 07/25/2022 4.44   3.87 - 5.11 MIL/uL Final   Hemoglobin 07/25/2022 13.7  12.0 - 15.0 g/dL Final   HCT 20/25/4270  40.2  36.0 - 46.0 % Final   MCV 07/25/2022 90.5  80.0 - 100.0 fL Final   MCH 07/25/2022 30.9  26.0 - 34.0 pg Final   MCHC 07/25/2022 34.1  30.0 - 36.0 g/dL Final   RDW 16/07/9603 12.2  11.5 - 15.5 % Final   Platelets 07/25/2022 248  150 - 400 K/uL Final   nRBC 07/25/2022 0.0  0.0 - 0.2 % Final   Neutrophils Relative % 07/25/2022 43  % Final   Neutro Abs 07/25/2022 3.6  1.7 - 7.7 K/uL Final   Lymphocytes Relative 07/25/2022 52  % Final   Lymphs Abs 07/25/2022 4.2 (H)  0.7 - 4.0 K/uL Final   Monocytes Relative 07/25/2022 5  % Final   Monocytes Absolute 07/25/2022 0.4  0.1 - 1.0 K/uL Final   Eosinophils Relative 07/25/2022 0  % Final   Eosinophils Absolute 07/25/2022 0.0  0.0 - 0.5 K/uL Final   Basophils Relative 07/25/2022 0  % Final   Basophils Absolute 07/25/2022 0.0  0.0 - 0.1 K/uL Final   Immature Granulocytes 07/25/2022 0  % Final   Abs Immature Granulocytes 07/25/2022 0.02  0.00 - 0.07 K/uL Final   Performed at [facility], [address] [place], [place] [address]   Sodium 07/25/2022 138  135 - 145 mmol/L Final   Potassium 07/25/2022 4.0  3.5 - 5.1 mmol/L Final   Chloride 07/25/2022 104  98 - 111 mmol/L Final   CO2 07/25/2022 29  22 - 32 mmol/L Final   Glucose, Bld 07/25/2022 83  70 - 99 mg/dL Final   Glucose reference range applies only to samples taken after fasting for at least 8 hours.   BUN 07/25/2022 11  6 - 20 mg/dL Final   Creatinine, Ser 07/25/2022 0.97  0.44 - 1.00 mg/dL Final   Calcium 11/91/4782 9.2  8.9 - 10.3 mg/dL Final   Total Protein 95/62/1308 7.0  6.5 - 8.1 g/dL Final   Albumin 65/78/4696 3.8  3.5 - 5.0 g/dL Final   AST 29/52/8413 18  15 - 41 U/L Final   ALT 07/25/2022 22  0 - 44 U/L Final   Alkaline Phosphatase 07/25/2022 64  38 - 126 U/L Final   Total Bilirubin 07/25/2022 0.2 (L)  0.3 - 1.2 mg/dL Final   GFR, Estimated 07/25/2022 >60  >60 mL/min Final    Comment: (NOTE) Calculated using the CKD-EPI Creatinine Equation (2021)    Anion gap 07/25/2022 5  5 - 15 Final   Performed at [facility], [address] [place], [place] [address]   POC Amphetamine UR 07/25/2022 None Detected  NONE DETECTED (Cut Off Level 1000 ng/mL) Preliminary   POC Secobarbital (BAR) 07/25/2022 None Detected  NONE DETECTED (Cut Off Level 300 ng/mL) Preliminary   POC Buprenorphine (BUP) 07/25/2022 None Detected  NONE DETECTED (Cut Off Level 10 ng/mL) Preliminary   POC Oxazepam (BZO) 07/25/2022 None Detected  NONE DETECTED (Cut Off Level 300 ng/mL) Preliminary   POC Cocaine UR 07/25/2022 None Detected  NONE DETECTED (Cut Off Level 300 ng/mL) Preliminary   POC Methamphetamine UR 07/25/2022 None Detected  NONE DETECTED (Cut Off Level 1000 ng/mL) Preliminary   POC Morphine 07/25/2022 None Detected  NONE DETECTED (Cut Off Level 300 ng/mL) Preliminary   POC Methadone UR 07/25/2022 None Detected  NONE DETECTED (Cut Off Level 300 ng/mL) Preliminary   POC Oxycodone UR 07/25/2022 None Detected  NONE DETECTED (Cut Off Level 100 ng/mL) Preliminary   POC Marijuana UR  07/25/2022 None Detected  NONE DETECTED (Cut Off Level 50 ng/mL) Preliminary   SARSCOV2ONAVIRUS 2 AG 07/25/2022 NEGATIVE  NEGATIVE Final   Comment: (NOTE) SARS-CoV-2 antigen NOT DETECTED.   Negative results are presumptive.  Negative results do not preclude SARS-CoV-2 infection and should not be used as the sole basis for treatment or other patient management decisions, including infection  control decisions, particularly in the presence of clinical signs and  symptoms consistent with COVID-19, or in those who have been in contact with the virus.  Negative results must be combined with clinical observations, patient history, and epidemiological information. The expected result is Negative.  Fact Sheet for Patients: https://www.jennings-kim.com/  Fact Sheet for Healthcare  Providers: https://alexander-rogers.biz/  This test is not yet approved or cleared by the [place] FDA and  has been authorized for detection and/or diagnosis of SARS-CoV-2 by FDA under an Emergency Use Authorization (EUA).  This EUA will remain in effect (meaning this test can be used) for the duration of  the COV                          ID-19 declaration under Section 564(b)(1) of the Act, 21 U.S.C. section 360bbb-3(b)(1), unless the authorization is terminated or revoked sooner.     Cholesterol 07/25/2022 181  0 - 200 mg/dL Final   Triglycerides 16/07/9603 118  <150 mg/dL Final   HDL 54/06/8118 45  >40 mg/dL Final   Total CHOL/HDL Ratio 07/25/2022 4.0  RATIO Final   VLDL 07/25/2022 24  0 - 40 mg/dL Final   LDL Cholesterol 07/25/2022 112 (H)  0 - 99 mg/dL Final   Comment:        Total Cholesterol/HDL:CHD Risk Coronary Heart Disease Risk Table                     Men   Women  1/2 Average Risk   3.4   3.3  Average Risk       5.0   4.4  2 X Average Risk   9.6   7.1  3 X Average Risk  23.4   11.0        Use the calculated Patient Ratio above and the CHD Risk Table to determine the patient's CHD Risk.        ATP III CLASSIFICATION (LDL):  <100     mg/dL   Optimal  147-829  mg/dL   Near or Above                    Optimal  130-159  mg/dL   Borderline  562-130  mg/dL   High  >865     mg/dL   Very High Performed at [facility], [address], [place], [place] [address]    TSH 07/25/2022 6.668 (H)  0.350 - 4.500 uIU/mL Final   Comment: Performed by a 3rd Generation assay with a functional sensitivity of <=0.01 uIU/mL. Performed at [facility], [address] [place], [place] [address]    Glucose-Capillary 07/26/2022 104 (H)  70 - 99 mg/dL Final   Glucose reference range applies only to samples taken after fasting for at least 8 hours.   T3, Free 07/31/2022 2.3  2.0 - 4.4 pg/mL Final   Comment: (NOTE) Performed At: [facility] [address] [place], [place] [id number] Jolene Schimke MD UU:[id number]    Free T4 07/31/2022 0.60 (L)  0.61 - 1.12 ng/dL  Final   Comment: (NOTE) Biotin ingestion may interfere with free T4 tests. If the results are inconsistent with the TSH level, previous test results, or the clinical presentation, then consider biotin interference. If needed, order repeat testing after stopping biotin. Performed at [facility], [address], [place], [place] [address]    Glucose-Capillary 08/29/2022 100 (H)  70 - 99 mg/dL Final   Glucose reference range applies only to samples taken after fasting for at least 8 hours.   TSH 09/05/2022 0.793  0.350 - 4.500 uIU/mL Final   Comment: Performed by a 3rd Generation assay with a functional sensitivity of <=0.01 uIU/mL. Performed at [facility], [address], [place], [place] [address]    Free T4 09/05/2022 0.73  0.61 - 1.12 ng/dL Final   Comment: (NOTE) Biotin ingestion may interfere with free T4 tests. If the results are inconsistent with the TSH level, previous test results, or the clinical presentation, then consider biotin interference. If needed, order repeat testing after stopping biotin. Performed at [facility], [address] [place], [place] [address]    Preg Test, Ur 09/06/2022 Negative  Negative Final   Preg Test, Ur 09/06/2022 NEGATIVE  NEGATIVE Final   Comment:        THE SENSITIVITY OF THIS METHODOLOGY IS >24 mIU/mL    Preg Test, Ur 09/05/2022 NEGATIVE  NEGATIVE Final   Comment:        THE SENSITIVITY OF THIS METHODOLOGY IS >24 mIU/mL    Sodium 09/13/2022 136  135 - 145 mmol/L Final   Potassium 09/13/2022 4.5  3.5 - 5.1 mmol/L Final   Chloride 09/13/2022 105  98 - 111 mmol/L Final   CO2 09/13/2022 21 (L)  22 - 32 mmol/L Final   Glucose, Bld 09/13/2022 111 (H)  70 - 99 mg/dL Final   Glucose reference range applies only to samples taken after fasting for at least 8 hours.   BUN 09/13/2022 14  6 - 20 mg/dL  Final   Creatinine, Ser 09/13/2022 0.92  0.44 - 1.00 mg/dL Final   Calcium 64/40/3474 9.1  8.9 - 10.3 mg/dL Final   GFR, Estimated 09/13/2022 >60  >60 mL/min Final   Comment: (NOTE) Calculated using the CKD-EPI Creatinine Equation (2021)    Anion gap 09/13/2022 10  5 - 15 Final   Performed at [facility], [address], [place], [place] [address]   Vitamin B-12 09/13/2022 585  180 - 914 pg/mL Final   Comment: (NOTE) This assay is not validated for testing neonatal or myeloproliferative syndrome specimens for Vitamin B12 levels. Performed at [facility], [address], [place], [place] [address]    Color, Urine 09/14/2022 YELLOW  YELLOW Final   APPearance 09/14/2022 HAZY (A)  CLEAR Final   Specific Gravity, Urine 09/14/2022 1.025  1.005 - 1.030 Final   pH 09/14/2022 5.0  5.0 - 8.0 Final   Glucose, UA 09/14/2022 NEGATIVE  NEGATIVE mg/dL Final   Hgb urine dipstick 09/14/2022 NEGATIVE  NEGATIVE Final   Bilirubin Urine 09/14/2022 NEGATIVE  NEGATIVE Final   Ketones, ur 09/14/2022 NEGATIVE  NEGATIVE mg/dL Final   Protein, ur 43/32/9518 NEGATIVE  NEGATIVE mg/dL Final   Nitrite 84/16/6063 NEGATIVE  NEGATIVE Final   Leukocytes,Ua 09/14/2022 TRACE (A)  NEGATIVE Final   RBC / HPF 09/14/2022 0-5  0 - 5 RBC/hpf Final   WBC, UA 09/14/2022 0-5  0 - 5 WBC/hpf Final   Bacteria, UA 09/14/2022 RARE (  A)  NONE SEEN Final   Squamous Epithelial / LPF 09/14/2022 0-5  0 - 5 Final   Mucus 09/14/2022 PRESENT   Final   Performed at [facility], [address], [place], [place] [address]  Admission on 07/04/2022, Discharged on 07/05/2022  Component Date Value Ref Range Status   Sodium 07/04/2022 142  135 - 145 mmol/L Final   Potassium 07/04/2022 4.4  3.5 - 5.1 mmol/L Final   Chloride 07/04/2022 109  98 - 111 mmol/L Final   CO2 07/04/2022 26  22 - 32 mmol/L Final   Glucose, Bld 07/04/2022 96  70 - 99 mg/dL Final   Glucose reference range applies only to samples taken after fasting for  at least 8 hours.   BUN 07/04/2022 15  6 - 20 mg/dL Final   Creatinine, Ser 07/04/2022 0.83  0.44 - 1.00 mg/dL Final   Calcium 72/53/6644 9.3  8.9 - 10.3 mg/dL Final   Total Protein 03/47/4259 7.2  6.5 - 8.1 g/dL Final   Albumin 56/38/7564 3.9  3.5 - 5.0 g/dL Final   AST 33/29/5188 23  15 - 41 U/L Final   ALT 07/04/2022 28  0 - 44 U/L Final   Alkaline Phosphatase 07/04/2022 77  38 - 126 U/L Final   Total Bilirubin 07/04/2022 0.4  0.3 - 1.2 mg/dL Final   GFR, Estimated 07/04/2022 >60  >60 mL/min Final   Comment: (NOTE) Calculated using the CKD-EPI Creatinine Equation (2021)    Anion gap 07/04/2022 7  5 - 15 Final   Performed at [facility], [address] [place], [place] [address]   Alcohol, Ethyl (B) 07/04/2022 <10  <10 mg/dL Final   Comment: (NOTE) Lowest detectable limit for serum alcohol is 10 mg/dL.  For medical purposes only. Performed at [facility], [address] [place], [place] [address]    WBC 07/04/2022 7.0  4.0 - 10.5 K/uL Final   RBC 07/04/2022 4.18  3.87 - 5.11 MIL/uL Final   Hemoglobin 07/04/2022 12.8  12.0 - 15.0 g/dL Final   HCT 10/22/3233 39.1  36.0 - 46.0 % Final   MCV 07/04/2022 93.5  80.0 - 100.0 fL Final   MCH 07/04/2022 30.6  26.0 - 34.0 pg Final   MCHC 07/04/2022 32.7  30.0 - 36.0 g/dL Final   RDW 57/32/2025 12.9  11.5 - 15.5 % Final   Platelets 07/04/2022 243  150 - 400 K/uL Final   nRBC 07/04/2022 0.0  0.0 - 0.2 % Final   Neutrophils Relative % 07/04/2022 43  % Final   Neutro Abs 07/04/2022 3.0  1.7 - 7.7 K/uL Final   Lymphocytes Relative 07/04/2022 50  % Final   Lymphs Abs 07/04/2022 3.5  0.7 - 4.0 K/uL Final   Monocytes Relative 07/04/2022 7  % Final   Monocytes Absolute 07/04/2022 0.5  0.1 - 1.0 K/uL Final   Eosinophils Relative 07/04/2022 0  % Final   Eosinophils Absolute 07/04/2022 0.0  0.0 - 0.5 K/uL Final   Basophils Relative 07/04/2022 0  % Final   Basophils Absolute 07/04/2022 0.0  0.0 - 0.1 K/uL  Final   Immature Granulocytes 07/04/2022 0  % Final   Abs Immature Granulocytes 07/04/2022 0.01  0.00 - 0.07 K/uL Final   Performed at [facility], [address], [place], [place] [address]   I-stat hCG, quantitative 07/04/2022 <5.0  <5 mIU/mL Final   Comment 3 07/04/2022  Final   Comment:   GEST. AGE      CONC.  (mIU/mL)   <=1 WEEK        5 - 50     2 WEEKS       50 - 500     3 WEEKS       100 - 10,000     4 WEEKS     1,000 - 30,000        FEMALE AND NON-PREGNANT FEMALE:     LESS THAN 5 mIU/mL   Admission on 06/14/2022, Discharged on 06/14/2022  Component Date Value Ref Range Status   Color, UA 06/14/2022 yellow  yellow Final   Clarity, UA 06/14/2022 cloudy (A)  clear Final   Glucose, UA 06/14/2022 negative  negative mg/dL Final   Bilirubin, UA 46/96/2952 negative  negative Final   Ketones, POC UA 06/14/2022 negative  negative mg/dL Final   Spec Grav, UA 84/13/2440 1.025  1.010 - 1.025 Final   Blood, UA 06/14/2022 negative  negative Final   pH, UA 06/14/2022 7.5  5.0 - 8.0 Final   Protein Ur, POC 06/14/2022 =30 (A)  negative mg/dL Final   Urobilinogen, UA 06/14/2022 0.2  0.2 or 1.0 E.U./dL Final   Nitrite, UA 08/10/2535 Negative  Negative Final   Leukocytes, UA 06/14/2022 Negative  Negative Final   Preg Test, Ur 06/14/2022 Negative  Negative Final   Specimen Description 06/14/2022 URINE, CLEAN CATCH   Final   Special Requests 06/14/2022    Final                   Value:NONE Performed at [facility], [address], [place], [place] [address]    Culture 06/14/2022 MULTIPLE SPECIES PRESENT, SUGGEST RECOLLECTION (A)   Final   Report Status 06/14/2022 06/16/2022 FINAL   Final  Admission on 06/07/2022, Discharged on 06/10/2022  Component Date Value Ref Range Status   SARS Coronavirus 2 by RT PCR 06/07/2022 NEGATIVE  NEGATIVE Final   Comment: (NOTE) SARS-CoV-2 target nucleic acids are NOT DETECTED.  The SARS-CoV-2 RNA is generally detectable in  upper respiratory specimens during the acute phase of infection. The lowest concentration of SARS-CoV-2 viral copies this assay can detect is 138 copies/mL. A negative result does not preclude SARS-Cov-2 infection and should not be used as the sole basis for treatment or other patient management decisions. A negative result may occur with  improper specimen collection/handling, submission of specimen other than nasopharyngeal swab, presence of viral mutation(s) within the areas targeted by this assay, and inadequate number of viral copies(<138 copies/mL). A negative result must be combined with clinical observations, patient history, and epidemiological information. The expected result is Negative.  Fact Sheet for Patients:  BloggerCourse.com  Fact Sheet for Healthcare Providers:  SeriousBroker.it  This test is no                          t yet approved or cleared by the [place] FDA and  has been authorized for detection and/or diagnosis of SARS-CoV-2 by FDA under an Emergency Use Authorization (EUA). This EUA will remain  in effect (meaning this test can be used) for the duration of the COVID-19 declaration under Section 564(b)(1) of the Act, 21 U.S.C.section 360bbb-3(b)(1), unless the authorization is terminated  or revoked sooner.       Influenza A by PCR 06/07/2022 NEGATIVE  NEGATIVE Final   Influenza B by PCR 06/07/2022 NEGATIVE  NEGATIVE Final   Comment: (NOTE) The Xpert Xpress SARS-CoV-2/FLU/RSV plus assay is intended as an aid in the diagnosis of influenza from Nasopharyngeal swab specimens and should not be used as a sole basis for treatment. Nasal washings and aspirates are unacceptable for Xpert Xpress SARS-CoV-2/FLU/RSV testing.  Fact Sheet for Patients: BloggerCourse.com  Fact Sheet for Healthcare Providers: SeriousBroker.it  This test is not yet approved  or cleared by the [place] FDA and has been authorized for detection and/or diagnosis of SARS-CoV-2 by FDA under an Emergency Use Authorization (EUA). This EUA will remain in effect (meaning this test can be used) for the duration of the COVID-19 declaration under Section 564(b)(1) of the Act, 21 U.S.C. section 360bbb-3(b)(1), unless the authorization is terminated or revoked.  Performed at [facility], [address] [place], [place] [address]    WBC 06/07/2022 5.7  4.0 - 10.5 K/uL Final   RBC 06/07/2022 4.39  3.87 - 5.11 MIL/uL Final   Hemoglobin 06/07/2022 13.2  12.0 - 15.0 g/dL Final   HCT 60/45/4098 40.4  36.0 - 46.0 % Final   MCV 06/07/2022 92.0  80.0 - 100.0 fL Final   MCH 06/07/2022 30.1  26.0 - 34.0 pg Final   MCHC 06/07/2022 32.7  30.0 - 36.0 g/dL Final   RDW 11/91/4782 12.4  11.5 - 15.5 % Final   Platelets 06/07/2022 308  150 - 400 K/uL Final   nRBC 06/07/2022 0.0  0.0 - 0.2 % Final   Neutrophils Relative % 06/07/2022 42  % Final   Neutro Abs 06/07/2022 2.4  1.7 - 7.7 K/uL Final   Lymphocytes Relative 06/07/2022 54  % Final   Lymphs Abs 06/07/2022 3.1  0.7 - 4.0 K/uL Final   Monocytes Relative 06/07/2022 4  % Final   Monocytes Absolute 06/07/2022 0.2  0.1 - 1.0 K/uL Final   Eosinophils Relative 06/07/2022 0  % Final   Eosinophils Absolute 06/07/2022 0.0  0.0 - 0.5 K/uL Final   Basophils Relative 06/07/2022 0  % Final   Basophils Absolute 06/07/2022 0.0  0.0 - 0.1 K/uL Final   Immature Granulocytes 06/07/2022 0  % Final   Abs Immature Granulocytes 06/07/2022 0.01  0.00 - 0.07 K/uL Final   Performed at [facility], [address], [place], [place] [address]   Sodium 06/07/2022 139  135 - 145 mmol/L Final   Potassium 06/07/2022 4.0  3.5 - 5.1 mmol/L Final   Chloride 06/07/2022 104  98 - 111 mmol/L Final   CO2 06/07/2022 28  22 - 32 mmol/L Final   Glucose, Bld 06/07/2022 104 (H)  70 - 99 mg/dL Final   Glucose reference range applies only to samples  taken after fasting for at least 8 hours.   BUN 06/07/2022 8  6 - 20 mg/dL Final   Creatinine, Ser 06/07/2022 0.84  0.44 - 1.00 mg/dL Final   Calcium 30/86/5784 9.1  8.9 - 10.3 mg/dL Final   Total Protein 69/62/9528 6.9  6.5 - 8.1 g/dL Final   Albumin 41/32/4401 3.7  3.5 - 5.0 g/dL Final   AST 02/72/5366 19  15 - 41 U/L Final   ALT 06/07/2022 22  0 - 44 U/L Final   Alkaline Phosphatase 06/07/2022 54  38 - 126 U/L Final   Total Bilirubin 06/07/2022 0.4  0.3 - 1.2 mg/dL Final   GFR, Estimated 06/07/2022 >60  >60 mL/min Final   Comment: (NOTE) Calculated using the CKD-EPI Creatinine Equation (2021)  Anion gap 06/07/2022 7  5 - 15 Final   Performed at [facility], [address] [place], [place] [address]   Hgb A1c MFr Bld 06/07/2022 5.0  4.8 - 5.6 % Final   Comment: (NOTE) Pre diabetes:          5.7%-6.4%  Diabetes:              >6.4%  Glycemic control for   <7.0% adults with diabetes    Mean Plasma Glucose 06/07/2022 96.8  mg/dL Final   Performed at [facility], [address] [place], [place] [address]   TSH 06/07/2022 1.620  0.350 - 4.500 uIU/mL Final   Comment: Performed by a 3rd Generation assay with a functional sensitivity of <=0.01 uIU/mL. Performed at [facility], [address], [place], [place] [address]    RPR Ser Ql 06/07/2022 NON REACTIVE  NON REACTIVE Final   Performed at [facility], [address] [place], [place] [address]   Color, Urine 06/07/2022 YELLOW  YELLOW Final   APPearance 06/07/2022 HAZY (A)  CLEAR Final   Specific Gravity, Urine 06/07/2022 1.018  1.005 - 1.030 Final   pH 06/07/2022 7.0  5.0 - 8.0 Final   Glucose, UA 06/07/2022 NEGATIVE  NEGATIVE mg/dL Final   Hgb urine dipstick 06/07/2022 NEGATIVE  NEGATIVE Final   Bilirubin Urine 06/07/2022 NEGATIVE  NEGATIVE Final   Ketones, ur 06/07/2022 NEGATIVE  NEGATIVE mg/dL Final   Protein, ur 34/35/6861 NEGATIVE  NEGATIVE mg/dL Final   Nitrite 68/37/2902 NEGATIVE  NEGATIVE  Final   Leukocytes,Ua 06/07/2022 NEGATIVE  NEGATIVE Final   Performed at [facility], [address] [place], [place] [address]   Cholesterol 06/07/2022 164  0 - 200 mg/dL Final   Triglycerides 20/80/2233 158 (H)  <150 mg/dL Final   HDL 61/22/4497 44  >40 mg/dL Final   Total CHOL/HDL Ratio 06/07/2022 3.7  RATIO Final   VLDL 06/07/2022 32  0 - 40 mg/dL Final   LDL Cholesterol 06/07/2022 88  0 - 99 mg/dL Final   Comment:        Total Cholesterol/HDL:CHD Risk Coronary Heart Disease Risk Table                     Men   Women  1/2 Average Risk   3.4   3.3  Average Risk       5.0   4.4  2 X Average Risk   9.6   7.1  3 X Average Risk  23.4   11.0        Use the calculated Patient Ratio above and the CHD Risk Table to determine the patient's CHD Risk.        ATP III CLASSIFICATION (LDL):  <100     mg/dL   Optimal  530-051  mg/dL   Near or Above                    Optimal  130-159  mg/dL   Borderline  102-111  mg/dL   High  >735     mg/dL   Very High Performed at [facility], [address], [place], [place] [address]    HIV Screen 4th Generation wRfx 06/07/2022 Non Reactive  Non Reactive Final   Performed at [facility], [address] [place], [place] [address]   SARSCOV2ONAVIRUS 2 AG 06/07/2022 NEGATIVE  NEGATIVE Final   Comment: (NOTE)  SARS-CoV-2 antigen NOT DETECTED.   Negative results are presumptive.  Negative results do not preclude SARS-CoV-2 infection and should not be used as the sole basis for treatment or other patient management decisions, including infection  control decisions, particularly in the presence of clinical signs and  symptoms consistent with COVID-19, or in those who have been in contact with the virus.  Negative results must be combined with clinical observations, patient history, and epidemiological information. The expected result is Negative.  Fact Sheet for Patients: https://www.jennings-kim.com/  Fact Sheet for  Healthcare Providers: https://alexander-rogers.biz/  This test is not yet approved or cleared by the [place] FDA and  has been authorized for detection and/or diagnosis of SARS-CoV-2 by FDA under an Emergency Use Authorization (EUA).  This EUA will remain in effect (meaning this test can be used) for the duration of  the COV                          ID-19 declaration under Section 564(b)(1) of the Act, 21 U.S.C. section 360bbb-3(b)(1), unless the authorization is terminated or revoked sooner.     POC Amphetamine UR 06/07/2022 None Detected  NONE DETECTED (Cut Off Level 1000 ng/mL) Final   POC Secobarbital (BAR) 06/07/2022 None Detected  NONE DETECTED (Cut Off Level 300 ng/mL) Final   POC Buprenorphine (BUP) 06/07/2022 None Detected  NONE DETECTED (Cut Off Level 10 ng/mL) Final   POC Oxazepam (BZO) 06/07/2022 None Detected  NONE DETECTED (Cut Off Level 300 ng/mL) Final   POC Cocaine UR 06/07/2022 None Detected  NONE DETECTED (Cut Off Level 300 ng/mL) Final   POC Methamphetamine UR 06/07/2022 None Detected  NONE DETECTED (Cut Off Level 1000 ng/mL) Final   POC Morphine 06/07/2022 None Detected  NONE DETECTED (Cut Off Level 300 ng/mL) Final   POC Methadone UR 06/07/2022 None Detected  NONE DETECTED (Cut Off Level 300 ng/mL) Final   POC Oxycodone UR 06/07/2022 None Detected  NONE DETECTED (Cut Off Level 100 ng/mL) Final   POC Marijuana UR 06/07/2022 None Detected  NONE DETECTED (Cut Off Level 50 ng/mL) Final   Preg Test, Ur 06/08/2022 NEGATIVE  NEGATIVE Final   Comment:        THE SENSITIVITY OF THIS METHODOLOGY IS >20 mIU/mL. Performed at [facility], [address] [place], [place] [address]    Preg Test, Ur 06/08/2022 NEGATIVE  NEGATIVE Final   Comment:        THE SENSITIVITY OF THIS METHODOLOGY IS >24 mIU/mL     Blood Alcohol level:  Lab Results  Component Value Date   ETH <10 07/04/2022   ETH <10 02/07/2022    Metabolic Disorder Labs: Lab Results   Component Value Date   HGBA1C 5.0 06/07/2022   MPG 96.8 06/07/2022   MPG 96.8 02/07/2022   No results found for: "PROLACTIN" Lab Results  Component Value Date   CHOL 181 07/25/2022   TRIG 118 07/25/2022   HDL 45 07/25/2022   CHOLHDL 4.0 07/25/2022   VLDL 24 07/25/2022   LDLCALC 112 (H) 07/25/2022   LDLCALC 88 06/07/2022    Therapeutic Lab Levels: No results found for: "LITHIUM" No results found for: "VALPROATE" No results found for: "CBMZ"  Physical Findings   GAD-7    Flowsheet Row [facility]17/20[facility]  Total GAD-7 Score 17      PHQ2-9    Flowsheet Row ED from 06/07/2022 in [facility] Office Visit from 12/17/2021 in [facility]  PHQ-2 Total Score 1 2  PHQ-9 Total Score 2 8      Flowsheet Row ED from 07/25/2022 in [facility] ED from 07/04/2022 in [facility] ED from 06/14/2022 in [facility]   C-SSRS RISK CATEGORY High Risk No Risk No Risk        Musculoskeletal  Strength & Muscle Tone: within normal limits Gait & Station: normal Patient leans: N/A  Psychiatric Specialty Exam  Presentation  General Appearance:  Appropriate for Environment; Casual  Eye Contact: Good  Speech: Clear and Coherent; Normal Rate  Speech Volume: Normal  Handedness: Right   Mood and Affect  Mood: Euthymic  Affect: Congruent   Thought Process  Thought Processes: Coherent  Descriptions of Associations:Intact  Orientation:Full (Time, Place and Person)  Thought Content:Logical  Diagnosis of Schizophrenia or Schizoaffective disorder in past: Yes  Duration of Psychotic Symptoms: Greater than six months   Hallucinations:Hallucinations: None  Ideas of Reference:None  Suicidal Thoughts:Suicidal Thoughts: No  Homicidal Thoughts:Homicidal Thoughts: No   Sensorium   Memory: Immediate Good; Recent Good; Remote Good  Judgment: Fair  Insight: Fair   Art therapist  Concentration: Good  Attention Span: Good  Recall: Good  Fund of Knowledge: Good  Language: Good   Psychomotor Activity  Psychomotor Activity: Psychomotor Activity: Normal   Assets  Assets: Physical Health; Communication Skills; Desire for Improvement; Resilience; Leisure Time   Sleep  Sleep: Sleep: Good   No data recorded  Physical Exam  Physical Exam Vitals and nursing note reviewed.  Constitutional:      General: She is not in acute distress.    Appearance: Normal appearance. She is not ill-appearing.  HENT:     Head: Normocephalic.  Eyes:     General:        Right eye: No discharge.        Left eye: No discharge.     Conjunctiva/sclera: Conjunctivae normal.  Cardiovascular:     Rate and Rhythm: Normal rate.  Pulmonary:     Effort: Pulmonary effort is normal.  Musculoskeletal:        General: Normal range of motion.     Cervical back: Normal range of motion.  Skin:    Coloration: Skin is not jaundiced or pale.  Neurological:     Mental Status: She is alert and oriented to person, place, and time.  Psychiatric:        Attention and Perception: Attention and perception normal.        Mood and Affect: Affect normal.        Speech: Speech normal.        Behavior: Behavior normal.        Thought Content: Thought content normal.        Cognition and Memory: Cognition normal.        Judgment: Judgment normal.    Review of Systems  Constitutional: Negative.   HENT: Negative.    Eyes: Negative.   Respiratory: Negative.    Cardiovascular: Negative.   Gastrointestinal: Negative.   Musculoskeletal: Negative.   Skin: Negative.   Neurological: Negative.   Psychiatric/Behavioral: Negative.     Blood pressure 106/68, pulse (!) 104, temperature 98.3 F (36.8 C), temperature source Oral, resp. rate 16, SpO2 99 %. There is no height or  weight on file to calculate BMI.  Treatment Plan Summary:  Will continue to have daily contact with patient to assess and evaluate symptoms and progress in treatment.    Patient remains cleared by psychiatry. Disposition is pending placement, transition of care staff pursuing disposition plan.   Ardis Hughsarolyn H Berklie Dethlefs, NP 09/16/2022 10:11 AM

## 2022-09-16 NOTE — ED Notes (Signed)
Pt is in the bed watching TV. Respirations are even and unlabored. No acute distress noted. Will continue to monitor for safety.  

## 2022-09-16 NOTE — Progress Notes (Addendum)
[name] Progress Note  1135 - Attempted to contact [facility], [phone], but phone continued to ring - could not leave a voicemail.  Research into this facility showed that it is more of a assisted living facility for the geriatric population.  There is still no email from [name] with the signed releases of information for [facility] Department.  A meeting is scheduled tomorrow, 5 December @ 1100 to gather updates.  ROI forms for [facility], [facility], [facility], [facility], and [employer] were completed and sent to [name] for the pt's legal guardian to sign.  [name] was instructed to return the forms via email along with the consent form for [facility] Department.  1555 - [name] went to speak with Luna Kitchens to answer what questions she had.  Paulla appeared to be under the impression that her score from the adaptive skills test would have qualified her for the [employer] Waiver program but did not appear to fully understand that the score from the [place] test has nothing to do with her eligibility, but rather quantify the skills to see how her behaviors match up to her FSIQ.  [name] reminded her of tomorrow's treatment team meeting at 1100 and informed her that the [name] will be coming to get her at around 1050.  Hansel Starling, MSW, [name] [facility] [phone] phone [phone] fax

## 2022-09-16 NOTE — ED Notes (Signed)
Pt resting quietly, breathing is even and unlabored.  Pt denies SI, HI, pain and AVH.  Will continue to monitor for safety.  

## 2022-09-17 NOTE — ED Notes (Signed)
Pt accepted scheduled meds w/o difficulty. Breakfast provided. Pt put a t-shirt on over a tank top after staff requested that she do so. Safety maintained and will continue to monitor.

## 2022-09-17 NOTE — ED Notes (Signed)
Pt is sleeping. No distress noted. Will continue to monitor safety. 

## 2022-09-17 NOTE — Care Management (Signed)
Writer consulted with Supervisor Loraine Leriche.  Per Loraine Leriche, the patient has a new DSS social Neil Crouch [phone].   Writer received a return phone call from the patient father.  The patient father reports that he went to the Health Department to obtain the records of fetal alcohol syndrome for the patient and was told that none of her files are there in [facility] Dept. Her father reports that all of her records are with [facility].    Writer will follow up with an email to Child psychotherapist and the [facility] Case Production designer, theatre/television/film

## 2022-09-17 NOTE — ED Notes (Signed)
Pt is in the bed resting. Respirations are even and unlabored. No acute distress noted. Will continue to monitor for safety 

## 2022-09-17 NOTE — ED Notes (Signed)
Pt A&O x 4, no distress noted, calm & cooperative at present, watching TV at present.  Monitoring for safety.

## 2022-09-17 NOTE — Progress Notes (Signed)
[name] Progress Note   1100 - [name] facilitated treatment team meeting to discuss updates with regards to pt's disposition.  In attendance were:  Hansel Starling, [name] [facility]) [name] Elisabeth Most [facility]) Nicole [name] (patient) Dr. [name] [facility]) Rebeccas Burnetta Sabin [facility] Coordinator) [name] (pt's father) Ms. Renato Shin [facility]) Pt's aunt was present as well.  [name] will be taking the legal paperwork transferring guardianship to the courthouse today.  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  [name] was instructed to forward the documentation to [name] and the [name]. Once [name] has that documentation, she will submit a request for the pt to be considered for the [employer] Waiver program.  [name] could not state how long the process would take before a final decision has been made.   Dana Allan is the Director of [employer] Wa[employer] who will be taking the application once it is submitted.   Efforts to find alternative placement will continue.  Hansel Starling, MSW, [name] [facility] [phone] phone [phone] fax

## 2022-09-17 NOTE — ED Provider Notes (Signed)
Behavioral Health Progress Note  Date and Time: 09/17/2022 10:57 PM Name: Nicole [name] MRN:  [id number]   HPI: Nicole [name] is a 31 year old female with a past psychiatric history significant for schizophrenia and major depressive disorder with psychosis who voluntarily presented to [facility] on 07/25/2022 via [facility] after a verbal altercation with Nicole [name] (not patient's legal guardian) for transient SI. This is patient's fourth visit to [facility] for similar concerns this year. Patient has been dismissed from previous group home due to threatening suicidal and homicidal ideations, then leaving the group home.   Legal Guardian: Mom Nicole [name]) transitioning to be Dad Nicole [name]) Point of contact: Dad Nicole [name]).   Nicole [name], 77 y.o., female patient seen face to face by this provider and chart reviewed on 09/17/22.   On today's evaluation Nicole [name] is sitting in her bed coloring and talking to the patient next to her.  She is observed laughing and smiling.  She is alert/oriented x 4, cooperative and attentive.  She has clear speech at a normal rate and tone.  She is casually dressed and makes good eye contact.  She is looking forward to her weekly meeting with Nicole [name].  She denies any depression, SI, AVH.  She denies any concerns with her appetite or sleep.  She is able to answer questions appropriately.  Patient remains psychiatrically cleared.  TOC in Nicole [name] continues to seek placement.   Diagnosis:  Final diagnoses:  Severe episode of recurrent major depressive disorder, with psychotic features (HCC)    Total Time spent with patient: 30 minutes  Past Psychiatric History: SEE H&p Past Medical History:  Past Medical History:  Diagnosis Date   Anxiety    Depression    Hypothyroidism 08/07/2022   Tobacco use disorder 08/07/2022    Past Surgical History:  Procedure Laterality Date   WISDOM TOOTH EXTRACTION Bilateral 2020   Family History:  Family History  Problem  Relation Age of Onset   Hypertension Father    Diabetes Father    Family Psychiatric  History: SEE H&P Social History:  Social History   Substance and Sexual Activity  Alcohol Use Never     Social History   Substance and Sexual Activity  Drug Use Never    Social History   Socioeconomic History   Marital status: Single    Spouse name: Not on file   Number of children: Not on file   Years of education: Not on file   Highest education level: Not on file  Occupational History   Not on file  Tobacco Use   Smoking status: Every Day    Types: Cigarettes   Smokeless tobacco: Never  Vaping Use   Vaping Use: Some days  Substance and Sexual Activity   Alcohol use: Never   Drug use: Never   Sexual activity: Yes    Partners: Female    Birth control/protection: Implant  Other Topics Concern   Not on file  Social History Narrative   Not on file   Social Determinants of Health   Financial Resource Strain: Not on file  Food Insecurity: Not on file  Transportation Needs: Not on file  Physical Activity: Not on file  Stress: Not on file  Social Connections: Not on file   SDOH:  SDOH Screenings   Depression (PHQ2-9): Low Risk  (06/10/2022)  Tobacco Use: High Risk (09/01/2022)   Additional Social History:    Pain Medications: SEE MAR Prescriptions: SEE MAR Over the Counter: SEE MAR History of alcohol /  drug use?: Yes Longest period of sobriety (when/how long): N/A Negative Consequences of Use:  (NONE) Withdrawal Symptoms: None Name of Substance 1: ETOH IN PAST--PT CURRENTLY DENIES 1 - Frequency: SOCIAL USE ETOH IN PAST 1 - Method of Aquiring: LEGAL 1- Route of Use: ORAL DRINK Name of Substance 2: NICOTINE-VAPE AND CIGARETTES 2 - Amount (size/oz): LESS THAN ONE PACK 2 - Frequency: DAILY 2 - Method of Aquiring: LEGAL 2 - Route of Substance Use: ORAL SMOKE                Sleep: Good  Appetite:  Good  Current Medications:  Current Facility-Administered  Medications  Medication Dose Route Frequency Provider Last Rate Last Admin   acetaminophen (TYLENOL) tablet 650 mg  650 mg Oral Q6H PRN Onuoha, Chinwendu V, NP   650 mg at 09/16/22 1619   alum & mag hydroxide-simeth (MAALOX/MYLANTA) 200-200-20 MG/5ML suspension 30 mL  30 mL Oral Q4H PRN Onuoha, Chinwendu V, NP   30 mL at 07/27/22 2151   ARIPiprazole ER (ABILIFY MAINTENA) injection 400 mg  400 mg Intramuscular Q28 days Princess Bruins, DO   400 mg at 08/30/22 1338   docusate sodium (COLACE) capsule 100 mg  100 mg Oral Daily [name], MD   100 mg at 09/17/22 0905   hydrOXYzine (ATARAX) tablet 25 mg  25 mg Oral TID PRN Onuoha, Chinwendu V, NP   25 mg at 09/17/22 2121   ibuprofen (ADVIL) tablet 400 mg  400 mg Oral Q6H PRN Onuoha, Chinwendu V, NP   400 mg at 09/17/22 0905   levothyroxine (SYNTHROID) tablet 100 mcg  100 mcg Oral Q0600 Princess Bruins, DO   100 mcg at 09/17/22 8413   metFORMIN (GLUCOPHAGE) tablet 500 mg  500 mg Oral Q breakfast Park Pope, MD   500 mg at 09/17/22 2440   nicotine (NICODERM CQ - dosed in mg/24 hours) patch 14 mg  14 mg Transdermal Q0600 Onuoha, Chinwendu V, NP   14 mg at 09/17/22 0905   ondansetron (ZOFRAN-ODT) disintegrating tablet 4 mg  4 mg Oral Q8H PRN Sindy Guadeloupe, NP   4 mg at 09/14/22 2351   Oxcarbazepine (TRILEPTAL) tablet 300 mg  300 mg Oral BID Onuoha, Chinwendu V, NP   300 mg at 09/17/22 2121   pantoprazole (PROTONIX) EC tablet 20 mg  20 mg Oral Daily Princess Bruins, DO   20 mg at 09/17/22 0905   QUEtiapine (SEROQUEL) tablet 400 mg  400 mg Oral BID Onuoha, Chinwendu V, NP   400 mg at 09/17/22 2121   sertraline (ZOLOFT) tablet 150 mg  150 mg Oral Daily Onuoha, Chinwendu V, NP   150 mg at 09/17/22 0905   traZODone (DESYREL) tablet 100 mg  100 mg Oral QHS Ardis Hughs, NP   100 mg at 09/17/22 2121   valACYclovir (VALTREX) tablet 500 mg  500 mg Oral Daily Princess Bruins, DO   500 mg at 09/17/22 0905   ziprasidone (GEODON) injection 20 mg  20 mg Intramuscular  Q12H PRN Onuoha, Chinwendu V, NP   20 mg at 09/13/22 2309   Current Outpatient Medications  Medication Sig Dispense Refill   ABILIFY MAINTENA 400 MG PRSY prefilled syringe 400 mg every 28 (twenty-eight) days.     cetirizine (ZYRTEC) 10 MG tablet Take 10 mg by mouth daily.     cyclobenzaprine (FLEXERIL) 10 MG tablet Take 1 tablet (10 mg total) by mouth 2 (two) times daily as needed for muscle spasms. 20 tablet 0  fluticasone (FLONASE) 50 MCG/ACT nasal spray Place 1 spray into both nostrils daily.     meloxicam (MOBIC) 15 MG tablet Take 15 mg by mouth daily.     Oxcarbazepine (TRILEPTAL) 300 MG tablet Take 1 tablet (300 mg total) by mouth 2 (two) times daily. 60 tablet 0   QUEtiapine (SEROQUEL) 400 MG tablet Take 1 tablet (400 mg total) by mouth 2 (two) times daily. 60 tablet 0   sertraline (ZOLOFT) 50 MG tablet Take 3 tablets (150 mg total) by mouth in the morning. 90 tablet 0   traZODone (DESYREL) 100 MG tablet Take 1 tablet (100 mg total) by mouth at bedtime. 30 tablet 0   valACYclovir (VALTREX) 500 MG tablet Take 500 mg by mouth daily.      Labs  Lab Results:  Admission on 07/25/2022  Component Date Value Ref Range Status   SARS Coronavirus 2 by RT PCR 07/25/2022 NEGATIVE  NEGATIVE Final   Comment: (NOTE) SARS-CoV-2 target nucleic acids are NOT DETECTED.  The SARS-CoV-2 RNA is generally detectable in upper respiratory specimens during the acute phase of infection. The lowest concentration of SARS-CoV-2 viral copies this assay can detect is 138 copies/mL. A negative result does not preclude SARS-Cov-2 infection and should not be used as the sole basis for treatment or other patient management decisions. A negative result may occur with  improper specimen collection/handling, submission of specimen other than nasopharyngeal swab, presence of viral mutation(s) within the areas targeted by this assay, and inadequate number of viral copies(<138 copies/mL). A negative result must be  combined with clinical observations, patient history, and epidemiological information. The expected result is Negative.  Fact Sheet for Patients:  BloggerCourse.com  Fact Sheet for Healthcare Providers:  SeriousBroker.it  This test is no                          t yet approved or cleared by the [place] FDA and  has been authorized for detection and/or diagnosis of SARS-CoV-2 by FDA under an Emergency Use Authorization (EUA). This EUA will remain  in effect (meaning this test can be used) for the duration of the COVID-19 declaration under Section 564(b)(1) of the Act, 21 U.S.C.section 360bbb-3(b)(1), unless the authorization is terminated  or revoked sooner.       Influenza A by PCR 07/25/2022 NEGATIVE  NEGATIVE Final   Influenza B by PCR 07/25/2022 NEGATIVE  NEGATIVE Final   Comment: (NOTE) The Xpert Xpress SARS-CoV-2/FLU/RSV plus assay is intended as an aid in the diagnosis of influenza from Nasopharyngeal swab specimens and should not be used as a sole basis for treatment. Nasal washings and aspirates are unacceptable for Xpert Xpress SARS-CoV-2/FLU/RSV testing.  Fact Sheet for Patients: BloggerCourse.com  Fact Sheet for Healthcare Providers: SeriousBroker.it  This test is not yet approved or cleared by the [place] FDA and has been authorized for detection and/or diagnosis of SARS-CoV-2 by FDA under an Emergency Use Authorization (EUA). This EUA will remain in effect (meaning this test can be used) for the duration of the COVID-19 declaration under Section 564(b)(1) of the Act, 21 U.S.C. section 360bbb-3(b)(1), unless the authorization is terminated or revoked.  Performed at [facility], [address], [place], [place] [address]    WBC 07/25/2022 8.3  4.0 - 10.5 K/uL Final   RBC 07/25/2022 4.44  3.87 - 5.11 MIL/uL Final   Hemoglobin 07/25/2022 13.7   12.0 - 15.0 g/dL Final   HCT 60/45/4098  40.2  36.0 - 46.0 % Final   MCV 07/25/2022 90.5  80.0 - 100.0 fL Final   MCH 07/25/2022 30.9  26.0 - 34.0 pg Final   MCHC 07/25/2022 34.1  30.0 - 36.0 g/dL Final   RDW 16/07/9603 12.2  11.5 - 15.5 % Final   Platelets 07/25/2022 248  150 - 400 K/uL Final   nRBC 07/25/2022 0.0  0.0 - 0.2 % Final   Neutrophils Relative % 07/25/2022 43  % Final   Neutro Abs 07/25/2022 3.6  1.7 - 7.7 K/uL Final   Lymphocytes Relative 07/25/2022 52  % Final   Lymphs Abs 07/25/2022 4.2 (H)  0.7 - 4.0 K/uL Final   Monocytes Relative 07/25/2022 5  % Final   Monocytes Absolute 07/25/2022 0.4  0.1 - 1.0 K/uL Final   Eosinophils Relative 07/25/2022 0  % Final   Eosinophils Absolute 07/25/2022 0.0  0.0 - 0.5 K/uL Final   Basophils Relative 07/25/2022 0  % Final   Basophils Absolute 07/25/2022 0.0  0.0 - 0.1 K/uL Final   Immature Granulocytes 07/25/2022 0  % Final   Abs Immature Granulocytes 07/25/2022 0.02  0.00 - 0.07 K/uL Final   Performed at [facility], [address] [place], [place] [address]   Sodium 07/25/2022 138  135 - 145 mmol/L Final   Potassium 07/25/2022 4.0  3.5 - 5.1 mmol/L Final   Chloride 07/25/2022 104  98 - 111 mmol/L Final   CO2 07/25/2022 29  22 - 32 mmol/L Final   Glucose, Bld 07/25/2022 83  70 - 99 mg/dL Final   Glucose reference range applies only to samples taken after fasting for at least 8 hours.   BUN 07/25/2022 11  6 - 20 mg/dL Final   Creatinine, Ser 07/25/2022 0.97  0.44 - 1.00 mg/dL Final   Calcium 11/91/4782 9.2  8.9 - 10.3 mg/dL Final   Total Protein 95/62/1308 7.0  6.5 - 8.1 g/dL Final   Albumin 65/78/4696 3.8  3.5 - 5.0 g/dL Final   AST 29/52/8413 18  15 - 41 U/L Final   ALT 07/25/2022 22  0 - 44 U/L Final   Alkaline Phosphatase 07/25/2022 64  38 - 126 U/L Final   Total Bilirubin 07/25/2022 0.2 (L)  0.3 - 1.2 mg/dL Final   GFR, Estimated 07/25/2022 >60  >60 mL/min Final   Comment: (NOTE) Calculated using the CKD-EPI  Creatinine Equation (2021)    Anion gap 07/25/2022 5  5 - 15 Final   Performed at [facility], [address], [place], [place] [address]   POC Amphetamine UR 07/25/2022 None Detected  NONE DETECTED (Cut Off Level 1000 ng/mL) Preliminary   POC Secobarbital (BAR) 07/25/2022 None Detected  NONE DETECTED (Cut Off Level 300 ng/mL) Preliminary   POC Buprenorphine (BUP) 07/25/2022 None Detected  NONE DETECTED (Cut Off Level 10 ng/mL) Preliminary   POC Oxazepam (BZO) 07/25/2022 None Detected  NONE DETECTED (Cut Off Level 300 ng/mL) Preliminary   POC Cocaine UR 07/25/2022 None Detected  NONE DETECTED (Cut Off Level 300 ng/mL) Preliminary   POC Methamphetamine UR 07/25/2022 None Detected  NONE DETECTED (Cut Off Level 1000 ng/mL) Preliminary   POC Morphine 07/25/2022 None Detected  NONE DETECTED (Cut Off Level 300 ng/mL) Preliminary   POC Methadone UR 07/25/2022 None Detected  NONE DETECTED (Cut Off Level 300 ng/mL) Preliminary   POC Oxycodone UR 07/25/2022 None Detected  NONE DETECTED (Cut Off Level 100 ng/mL) Preliminary   POC Marijuana UR  07/25/2022 None Detected  NONE DETECTED (Cut Off Level 50 ng/mL) Preliminary   SARSCOV2ONAVIRUS 2 AG 07/25/2022 NEGATIVE  NEGATIVE Final   Comment: (NOTE) SARS-CoV-2 antigen NOT DETECTED.   Negative results are presumptive.  Negative results do not preclude SARS-CoV-2 infection and should not be used as the sole basis for treatment or other patient management decisions, including infection  control decisions, particularly in the presence of clinical signs and  symptoms consistent with COVID-19, or in those who have been in contact with the virus.  Negative results must be combined with clinical observations, patient history, and epidemiological information. The expected result is Negative.  Fact Sheet for Patients: https://www.jennings-kim.com/  Fact Sheet for Healthcare Providers: https://alexander-rogers.biz/  This test is  not yet approved or cleared by the [place] FDA and  has been authorized for detection and/or diagnosis of SARS-CoV-2 by FDA under an Emergency Use Authorization (EUA).  This EUA will remain in effect (meaning this test can be used) for the duration of  the COV                          ID-19 declaration under Section 564(b)(1) of the Act, 21 U.S.C. section 360bbb-3(b)(1), unless the authorization is terminated or revoked sooner.     Cholesterol 07/25/2022 181  0 - 200 mg/dL Final   Triglycerides 16/07/9603 118  <150 mg/dL Final   HDL 54/06/8118 45  >40 mg/dL Final   Total CHOL/HDL Ratio 07/25/2022 4.0  RATIO Final   VLDL 07/25/2022 24  0 - 40 mg/dL Final   LDL Cholesterol 07/25/2022 112 (H)  0 - 99 mg/dL Final   Comment:        Total Cholesterol/HDL:CHD Risk Coronary Heart Disease Risk Table                     Men   Women  1/2 Average Risk   3.4   3.3  Average Risk       5.0   4.4  2 X Average Risk   9.6   7.1  3 X Average Risk  23.4   11.0        Use the calculated Patient Ratio above and the CHD Risk Table to determine the patient's CHD Risk.        ATP III CLASSIFICATION (LDL):  <100     mg/dL   Optimal  147-829  mg/dL   Near or Above                    Optimal  130-159  mg/dL   Borderline  562-130  mg/dL   High  >865     mg/dL   Very High Performed at [facility], [address] [place], [place] [address]    TSH 07/25/2022 6.668 (H)  0.350 - 4.500 uIU/mL Final   Comment: Performed by a 3rd Generation assay with a functional sensitivity of <=0.01 uIU/mL. Performed at [facility], [address], [place], [place] [address]    Glucose-Capillary 07/26/2022 104 (H)  70 - 99 mg/dL Final   Glucose reference range applies only to samples taken after fasting for at least 8 hours.   T3, Free 07/31/2022 2.3  2.0 - 4.4 pg/mL Final   Comment: (NOTE) Performed At: [facility] [address] [place], [place] [id number] Jolene Schimke MD  UU:[id number]    Free T4 07/31/2022 0.60 (L)  0.61 - 1.12 ng/dL  Final   Comment: (NOTE) Biotin ingestion may interfere with free T4 tests. If the results are inconsistent with the TSH level, previous test results, or the clinical presentation, then consider biotin interference. If needed, order repeat testing after stopping biotin. Performed at [facility], [address], [place], [place] [address]    Glucose-Capillary 08/29/2022 100 (H)  70 - 99 mg/dL Final   Glucose reference range applies only to samples taken after fasting for at least 8 hours.   TSH 09/05/2022 0.793  0.350 - 4.500 uIU/mL Final   Comment: Performed by a 3rd Generation assay with a functional sensitivity of <=0.01 uIU/mL. Performed at [facility], [address], [place], [place] [address]    Free T4 09/05/2022 0.73  0.61 - 1.12 ng/dL Final   Comment: (NOTE) Biotin ingestion may interfere with free T4 tests. If the results are inconsistent with the TSH level, previous test results, or the clinical presentation, then consider biotin interference. If needed, order repeat testing after stopping biotin. Performed at [facility], [address] [place], [place] [address]    Preg Test, Ur 09/06/2022 Negative  Negative Final   Preg Test, Ur 09/06/2022 NEGATIVE  NEGATIVE Final   Comment:        THE SENSITIVITY OF THIS METHODOLOGY IS >24 mIU/mL    Preg Test, Ur 09/05/2022 NEGATIVE  NEGATIVE Final   Comment:        THE SENSITIVITY OF THIS METHODOLOGY IS >24 mIU/mL    Sodium 09/13/2022 136  135 - 145 mmol/L Final   Potassium 09/13/2022 4.5  3.5 - 5.1 mmol/L Final   Chloride 09/13/2022 105  98 - 111 mmol/L Final   CO2 09/13/2022 21 (L)  22 - 32 mmol/L Final   Glucose, Bld 09/13/2022 111 (H)  70 - 99 mg/dL Final   Glucose reference range applies only to samples taken after fasting for at least 8 hours.   BUN 09/13/2022 14  6 - 20 mg/dL Final   Creatinine, Ser 09/13/2022 0.92  0.44 - 1.00 mg/dL  Final   Calcium 32/44/0102 9.1  8.9 - 10.3 mg/dL Final   GFR, Estimated 09/13/2022 >60  >60 mL/min Final   Comment: (NOTE) Calculated using the CKD-EPI Creatinine Equation (2021)    Anion gap 09/13/2022 10  5 - 15 Final   Performed at [facility], [address] [place], [place] [address]   Vitamin B-12 09/13/2022 585  180 - 914 pg/mL Final   Comment: (NOTE) This assay is not validated for testing neonatal or myeloproliferative syndrome specimens for Vitamin B12 levels. Performed at [facility], [address], [place], [place] [address]    Color, Urine 09/14/2022 YELLOW  YELLOW Final   APPearance 09/14/2022 HAZY (A)  CLEAR Final   Specific Gravity, Urine 09/14/2022 1.025  1.005 - 1.030 Final   pH 09/14/2022 5.0  5.0 - 8.0 Final   Glucose, UA 09/14/2022 NEGATIVE  NEGATIVE mg/dL Final   Hgb urine dipstick 09/14/2022 NEGATIVE  NEGATIVE Final   Bilirubin Urine 09/14/2022 NEGATIVE  NEGATIVE Final   Ketones, ur 09/14/2022 NEGATIVE  NEGATIVE mg/dL Final   Protein, ur 47/42/5956 NEGATIVE  NEGATIVE mg/dL Final   Nitrite 38/75/6433 NEGATIVE  NEGATIVE Final   Leukocytes,Ua 09/14/2022 TRACE (A)  NEGATIVE Final   RBC / HPF 09/14/2022 0-5  0 - 5 RBC/hpf Final   WBC, UA 09/14/2022 0-5  0 - 5 WBC/hpf Final   Bacteria, UA 09/14/2022 RARE (  A)  NONE SEEN Final   Squamous Epithelial / LPF 09/14/2022 0-5  0 - 5 Final   Mucus 09/14/2022 PRESENT   Final   Performed at [facility], [address], [place], [place] [address]   Glucose-Capillary 09/16/2022 105 (H)  70 - 99 mg/dL Final   Glucose reference range applies only to samples taken after fasting for at least 8 hours.  Admission on 07/04/2022, Discharged on 07/05/2022  Component Date Value Ref Range Status   Sodium 07/04/2022 142  135 - 145 mmol/L Final   Potassium 07/04/2022 4.4  3.5 - 5.1 mmol/L Final   Chloride 07/04/2022 109  98 - 111 mmol/L Final   CO2 07/04/2022 26  22 - 32 mmol/L Final   Glucose, Bld 07/04/2022 96   70 - 99 mg/dL Final   Glucose reference range applies only to samples taken after fasting for at least 8 hours.   BUN 07/04/2022 15  6 - 20 mg/dL Final   Creatinine, Ser 07/04/2022 0.83  0.44 - 1.00 mg/dL Final   Calcium 75/91/6384 9.3  8.9 - 10.3 mg/dL Final   Total Protein 66/59/9357 7.2  6.5 - 8.1 g/dL Final   Albumin 01/77/9390 3.9  3.5 - 5.0 g/dL Final   AST 30/06/2329 23  15 - 41 U/L Final   ALT 07/04/2022 28  0 - 44 U/L Final   Alkaline Phosphatase 07/04/2022 77  38 - 126 U/L Final   Total Bilirubin 07/04/2022 0.4  0.3 - 1.2 mg/dL Final   GFR, Estimated 07/04/2022 >60  >60 mL/min Final   Comment: (NOTE) Calculated using the CKD-EPI Creatinine Equation (2021)    Anion gap 07/04/2022 7  5 - 15 Final   Performed at [facility], [address] [place], [place] [address]   Alcohol, Ethyl (B) 07/04/2022 <10  <10 mg/dL Final   Comment: (NOTE) Lowest detectable limit for serum alcohol is 10 mg/dL.  For medical purposes only. Performed at [facility], [address] [place], [place] [address]    WBC 07/04/2022 7.0  4.0 - 10.5 K/uL Final   RBC 07/04/2022 4.18  3.87 - 5.11 MIL/uL Final   Hemoglobin 07/04/2022 12.8  12.0 - 15.0 g/dL Final   HCT 45/62/5638 39.1  36.0 - 46.0 % Final   MCV 07/04/2022 93.5  80.0 - 100.0 fL Final   MCH 07/04/2022 30.6  26.0 - 34.0 pg Final   MCHC 07/04/2022 32.7  30.0 - 36.0 g/dL Final   RDW 93/73/4287 12.9  11.5 - 15.5 % Final   Platelets 07/04/2022 243  150 - 400 K/uL Final   nRBC 07/04/2022 0.0  0.0 - 0.2 % Final   Neutrophils Relative % 07/04/2022 43  % Final   Neutro Abs 07/04/2022 3.0  1.7 - 7.7 K/uL Final   Lymphocytes Relative 07/04/2022 50  % Final   Lymphs Abs 07/04/2022 3.5  0.7 - 4.0 K/uL Final   Monocytes Relative 07/04/2022 7  % Final   Monocytes Absolute 07/04/2022 0.5  0.1 - 1.0 K/uL Final   Eosinophils Relative 07/04/2022 0  % Final   Eosinophils Absolute 07/04/2022 0.0  0.0 - 0.5 K/uL Final    Basophils Relative 07/04/2022 0  % Final   Basophils Absolute 07/04/2022 0.0  0.0 - 0.1 K/uL Final   Immature Granulocytes 07/04/2022 0  % Final   Abs Immature Granulocytes 07/04/2022 0.01  0.00 - 0.07 K/uL Final   Performed at [facility], [address] [place], [place] [address]   I-stat hCG, quantitative 07/04/2022 <5.0  <5 mIU/mL Final   Comment 3 07/04/2022          Final   Comment:   GEST. AGE      CONC.  (mIU/mL)   <=1 WEEK        5 - 50     2 WEEKS       50 - 500     3 WEEKS       100 - 10,000     4 WEEKS     1,000 - 30,000        FEMALE AND NON-PREGNANT FEMALE:     LESS THAN 5 mIU/mL   Admission on 06/14/2022, Discharged on 06/14/2022  Component Date Value Ref Range Status   Color, UA 06/14/2022 yellow  yellow Final   Clarity, UA 06/14/2022 cloudy (A)  clear Final   Glucose, UA 06/14/2022 negative  negative mg/dL Final   Bilirubin, UA 62/13/0865 negative  negative Final   Ketones, POC UA 06/14/2022 negative  negative mg/dL Final   Spec Grav, UA 78/46/9629 1.025  1.010 - 1.025 Final   Blood, UA 06/14/2022 negative  negative Final   pH, UA 06/14/2022 7.5  5.0 - 8.0 Final   Protein Ur, POC 06/14/2022 =30 (A)  negative mg/dL Final   Urobilinogen, UA 06/14/2022 0.2  0.2 or 1.0 E.U./dL Final   Nitrite, UA 52/84/1324 Negative  Negative Final   Leukocytes, UA 06/14/2022 Negative  Negative Final   Preg Test, Ur 06/14/2022 Negative  Negative Final   Specimen Description 06/14/2022 URINE, CLEAN CATCH   Final   Special Requests 06/14/2022    Final                   Value:NONE Performed at [facility], [address], [place], [place] [address]    Culture 06/14/2022 MULTIPLE SPECIES PRESENT, SUGGEST RECOLLECTION (A)   Final   Report Status 06/14/2022 06/16/2022 FINAL   Final  Admission on 06/07/2022, Discharged on 06/10/2022  Component Date Value Ref Range Status   SARS Coronavirus 2 by RT PCR 06/07/2022 NEGATIVE  NEGATIVE Final   Comment:  (NOTE) SARS-CoV-2 target nucleic acids are NOT DETECTED.  The SARS-CoV-2 RNA is generally detectable in upper respiratory specimens during the acute phase of infection. The lowest concentration of SARS-CoV-2 viral copies this assay can detect is 138 copies/mL. A negative result does not preclude SARS-Cov-2 infection and should not be used as the sole basis for treatment or other patient management decisions. A negative result may occur with  improper specimen collection/handling, submission of specimen other than nasopharyngeal swab, presence of viral mutation(s) within the areas targeted by this assay, and inadequate number of viral copies(<138 copies/mL). A negative result must be combined with clinical observations, patient history, and epidemiological information. The expected result is Negative.  Fact Sheet for Patients:  BloggerCourse.com  Fact Sheet for Healthcare Providers:  SeriousBroker.it  This test is no                          t yet approved or cleared by the [place] FDA and  has been authorized for detection and/or diagnosis of SARS-CoV-2 by FDA under an Emergency Use Authorization (EUA). This EUA will remain  in effect (meaning this test can be used) for the duration of the COVID-19 declaration under Section 564(b)(1) of the Act, 21 U.S.C.section 360bbb-3(b)(1), unless the  authorization is terminated  or revoked sooner.       Influenza A by PCR 06/07/2022 NEGATIVE  NEGATIVE Final   Influenza B by PCR 06/07/2022 NEGATIVE  NEGATIVE Final   Comment: (NOTE) The Xpert Xpress SARS-CoV-2/FLU/RSV plus assay is intended as an aid in the diagnosis of influenza from Nasopharyngeal swab specimens and should not be used as a sole basis for treatment. Nasal washings and aspirates are unacceptable for Xpert Xpress SARS-CoV-2/FLU/RSV testing.  Fact Sheet for Patients: BloggerCourse.com  Fact  Sheet for Healthcare Providers: SeriousBroker.it  This test is not yet approved or cleared by the [place] FDA and has been authorized for detection and/or diagnosis of SARS-CoV-2 by FDA under an Emergency Use Authorization (EUA). This EUA will remain in effect (meaning this test can be used) for the duration of the COVID-19 declaration under Section 564(b)(1) of the Act, 21 U.S.C. section 360bbb-3(b)(1), unless the authorization is terminated or revoked.  Performed at [facility], [address], [place], [place] [address]    WBC 06/07/2022 5.7  4.0 - 10.5 K/uL Final   RBC 06/07/2022 4.39  3.87 - 5.11 MIL/uL Final   Hemoglobin 06/07/2022 13.2  12.0 - 15.0 g/dL Final   HCT 60/45/4098 40.4  36.0 - 46.0 % Final   MCV 06/07/2022 92.0  80.0 - 100.0 fL Final   MCH 06/07/2022 30.1  26.0 - 34.0 pg Final   MCHC 06/07/2022 32.7  30.0 - 36.0 g/dL Final   RDW 11/91/4782 12.4  11.5 - 15.5 % Final   Platelets 06/07/2022 308  150 - 400 K/uL Final   nRBC 06/07/2022 0.0  0.0 - 0.2 % Final   Neutrophils Relative % 06/07/2022 42  % Final   Neutro Abs 06/07/2022 2.4  1.7 - 7.7 K/uL Final   Lymphocytes Relative 06/07/2022 54  % Final   Lymphs Abs 06/07/2022 3.1  0.7 - 4.0 K/uL Final   Monocytes Relative 06/07/2022 4  % Final   Monocytes Absolute 06/07/2022 0.2  0.1 - 1.0 K/uL Final   Eosinophils Relative 06/07/2022 0  % Final   Eosinophils Absolute 06/07/2022 0.0  0.0 - 0.5 K/uL Final   Basophils Relative 06/07/2022 0  % Final   Basophils Absolute 06/07/2022 0.0  0.0 - 0.1 K/uL Final   Immature Granulocytes 06/07/2022 0  % Final   Abs Immature Granulocytes 06/07/2022 0.01  0.00 - 0.07 K/uL Final   Performed at [facility], [address], [place], [place] [address]   Sodium 06/07/2022 139  135 - 145 mmol/L Final   Potassium 06/07/2022 4.0  3.5 - 5.1 mmol/L Final   Chloride 06/07/2022 104  98 - 111 mmol/L Final   CO2 06/07/2022 28  22 - 32 mmol/L Final    Glucose, Bld 06/07/2022 104 (H)  70 - 99 mg/dL Final   Glucose reference range applies only to samples taken after fasting for at least 8 hours.   BUN 06/07/2022 8  6 - 20 mg/dL Final   Creatinine, Ser 06/07/2022 0.84  0.44 - 1.00 mg/dL Final   Calcium 30/86/5784 9.1  8.9 - 10.3 mg/dL Final   Total Protein 69/62/9528 6.9  6.5 - 8.1 g/dL Final   Albumin 41/32/4401 3.7  3.5 - 5.0 g/dL Final   AST 02/72/5366 19  15 - 41 U/L Final   ALT 06/07/2022 22  0 - 44 U/L Final   Alkaline Phosphatase 06/07/2022 54  38 - 126 U/L Final   Total Bilirubin 06/07/2022  0.4  0.3 - 1.2 mg/dL Final   GFR, Estimated 06/07/2022 >60  >60 mL/min Final   Comment: (NOTE) Calculated using the CKD-EPI Creatinine Equation (2021)    Anion gap 06/07/2022 7  5 - 15 Final   Performed at [facility], [address], [place], [place] [address]   Hgb A1c MFr Bld 06/07/2022 5.0  4.8 - 5.6 % Final   Comment: (NOTE) Pre diabetes:          5.7%-6.4%  Diabetes:              >6.4%  Glycemic control for   <7.0% adults with diabetes    Mean Plasma Glucose 06/07/2022 96.8  mg/dL Final   Performed at [facility], [address] [place], [place] [address]   TSH 06/07/2022 1.620  0.350 - 4.500 uIU/mL Final   Comment: Performed by a 3rd Generation assay with a functional sensitivity of <=0.01 uIU/mL. Performed at [facility], [address] [place], [place] [address]    RPR Ser Ql 06/07/2022 NON REACTIVE  NON REACTIVE Final   Performed at [facility], [address] [place], [place] [address]   Color, Urine 06/07/2022 YELLOW  YELLOW Final   APPearance 06/07/2022 HAZY (A)  CLEAR Final   Specific Gravity, Urine 06/07/2022 1.018  1.005 - 1.030 Final   pH 06/07/2022 7.0  5.0 - 8.0 Final   Glucose, UA 06/07/2022 NEGATIVE  NEGATIVE mg/dL Final   Hgb urine dipstick 06/07/2022 NEGATIVE  NEGATIVE Final   Bilirubin Urine 06/07/2022 NEGATIVE  NEGATIVE Final   Ketones, ur 06/07/2022 NEGATIVE  NEGATIVE  mg/dL Final   Protein, ur 84/69/6295 NEGATIVE  NEGATIVE mg/dL Final   Nitrite 28/41/3244 NEGATIVE  NEGATIVE Final   Leukocytes,Ua 06/07/2022 NEGATIVE  NEGATIVE Final   Performed at [facility], [address] [place], [place] [address]   Cholesterol 06/07/2022 164  0 - 200 mg/dL Final   Triglycerides 25/36/6440 158 (H)  <150 mg/dL Final   HDL 34/74/2595 44  >40 mg/dL Final   Total CHOL/HDL Ratio 06/07/2022 3.7  RATIO Final   VLDL 06/07/2022 32  0 - 40 mg/dL Final   LDL Cholesterol 06/07/2022 88  0 - 99 mg/dL Final   Comment:        Total Cholesterol/HDL:CHD Risk Coronary Heart Disease Risk Table                     Men   Women  1/2 Average Risk   3.4   3.3  Average Risk       5.0   4.4  2 X Average Risk   9.6   7.1  3 X Average Risk  23.4   11.0        Use the calculated Patient Ratio above and the CHD Risk Table to determine the patient's CHD Risk.        ATP III CLASSIFICATION (LDL):  <100     mg/dL   Optimal  638-756  mg/dL   Near or Above                    Optimal  130-159  mg/dL   Borderline  433-295  mg/dL   High  >188     mg/dL   Very High Performed at [facility], [address], [place], [place] [address]    HIV Screen 4th Generation wRfx 06/07/2022 Non Reactive  Non  Reactive Final   Performed at [facility], [address] [place], [place] [address]   SARSCOV2ONAVIRUS 2 AG 06/07/2022 NEGATIVE  NEGATIVE Final   Comment: (NOTE) SARS-CoV-2 antigen NOT DETECTED.   Negative results are presumptive.  Negative results do not preclude SARS-CoV-2 infection and should not be used as the sole basis for treatment or other patient management decisions, including infection  control decisions, particularly in the presence of clinical signs and  symptoms consistent with COVID-19, or in those who have been in contact with the virus.  Negative results must be combined with clinical observations, patient history, and epidemiological information. The  expected result is Negative.  Fact Sheet for Patients: https://www.jennings-kim.com/  Fact Sheet for Healthcare Providers: https://alexander-rogers.biz/  This test is not yet approved or cleared by the [place] FDA and  has been authorized for detection and/or diagnosis of SARS-CoV-2 by FDA under an Emergency Use Authorization (EUA).  This EUA will remain in effect (meaning this test can be used) for the duration of  the COV                          ID-19 declaration under Section 564(b)(1) of the Act, 21 U.S.C. section 360bbb-3(b)(1), unless the authorization is terminated or revoked sooner.     POC Amphetamine UR 06/07/2022 None Detected  NONE DETECTED (Cut Off Level 1000 ng/mL) Final   POC Secobarbital (BAR) 06/07/2022 None Detected  NONE DETECTED (Cut Off Level 300 ng/mL) Final   POC Buprenorphine (BUP) 06/07/2022 None Detected  NONE DETECTED (Cut Off Level 10 ng/mL) Final   POC Oxazepam (BZO) 06/07/2022 None Detected  NONE DETECTED (Cut Off Level 300 ng/mL) Final   POC Cocaine UR 06/07/2022 None Detected  NONE DETECTED (Cut Off Level 300 ng/mL) Final   POC Methamphetamine UR 06/07/2022 None Detected  NONE DETECTED (Cut Off Level 1000 ng/mL) Final   POC Morphine 06/07/2022 None Detected  NONE DETECTED (Cut Off Level 300 ng/mL) Final   POC Methadone UR 06/07/2022 None Detected  NONE DETECTED (Cut Off Level 300 ng/mL) Final   POC Oxycodone UR 06/07/2022 None Detected  NONE DETECTED (Cut Off Level 100 ng/mL) Final   POC Marijuana UR 06/07/2022 None Detected  NONE DETECTED (Cut Off Level 50 ng/mL) Final   Preg Test, Ur 06/08/2022 NEGATIVE  NEGATIVE Final   Comment:        THE SENSITIVITY OF THIS METHODOLOGY IS >20 mIU/mL. Performed at [facility], [address] [place], [place] [address]    Preg Test, Ur 06/08/2022 NEGATIVE  NEGATIVE Final   Comment:        THE SENSITIVITY OF THIS METHODOLOGY IS >24 mIU/mL     Blood Alcohol level:  Lab  Results  Component Value Date   ETH <10 07/04/2022   ETH <10 02/07/2022    Metabolic Disorder Labs: Lab Results  Component Value Date   HGBA1C 5.0 06/07/2022   MPG 96.8 06/07/2022   MPG 96.8 02/07/2022   No results found for: "PROLACTIN" Lab Results  Component Value Date   CHOL 181 07/25/2022   TRIG 118 07/25/2022   HDL 45 07/25/2022   CHOLHDL 4.0 07/25/2022   VLDL 24 07/25/2022   LDLCALC 112 (H) 07/25/2022   LDLCALC 88 06/07/2022    Therapeutic Lab Levels: No results found for: "LITHIUM" No results found for: "VALPROATE" No results found for: "CBMZ"  Physical Findings   GAD-7    Flowsheet Row Office  [facility]17/[facility]  Total GAD-7 Score 17      PHQ2-9    Flowsheet Row ED from 06/07/2022 in [facility] Office Visit from 12/17/2021 in [facility]  PHQ-2 Total Score 1 2  PHQ-9 Total Score 2 8      Flowsheet Row ED from 07/25/2022 in [facility] ED from 07/04/2022 in [facility] DEPT ED from 06/14/2022 in [facility]   C-SSRS RISK CATEGORY High Risk No Risk No Risk        Musculoskeletal  Strength & Muscle Tone: within normal limits Gait & Station: normal Patient leans: N/A  Psychiatric Specialty Exam  Presentation  General Appearance:  Appropriate for Environment; Casual  Eye Contact: Good  Speech: Clear and Coherent; Normal Rate  Speech Volume: Normal  Handedness: Right   Mood and Affect  Mood: Euthymic  Affect: Congruent   Thought Process  Thought Processes: Coherent  Descriptions of Associations:Intact  Orientation:Full (Time, Place and Person)  Thought Content:Logical  Diagnosis of Schizophrenia or Schizoaffective disorder in past: Yes  Duration of Psychotic Symptoms: Greater than six months   Hallucinations:Hallucinations: None  Ideas of  Reference:None  Suicidal Thoughts:Suicidal Thoughts: No  Homicidal Thoughts:Homicidal Thoughts: No   Sensorium  Memory: Immediate Good; Recent Good; Remote Good  Judgment: Fair  Insight: Fair   Art therapist  Concentration: Good  Attention Span: Good  Recall: Good  Fund of Knowledge: Good  Language: Good   Psychomotor Activity  Psychomotor Activity: Psychomotor Activity: Normal   Assets  Assets: Communication Skills; Desire for Improvement; Physical Health; Resilience   Sleep  Sleep: Sleep: Good   No data recorded  Physical Exam  Physical Exam Vitals and nursing note reviewed.  Constitutional:      General: She is not in acute distress.    Appearance: Normal appearance. She is not ill-appearing.  HENT:     Head: Normocephalic.  Eyes:     General:        Right eye: No discharge.        Left eye: No discharge.     Conjunctiva/sclera: Conjunctivae normal.  Cardiovascular:     Rate and Rhythm: Normal rate.  Pulmonary:     Effort: Pulmonary effort is normal.  Musculoskeletal:        General: Normal range of motion.     Cervical back: Normal range of motion.  Skin:    Coloration: Skin is not jaundiced or pale.  Neurological:     Mental Status: She is alert and oriented to person, place, and time.  Psychiatric:        Attention and Perception: Attention and perception normal.        Mood and Affect: Affect normal.        Speech: Speech normal.        Behavior: Behavior normal. Behavior is cooperative.        Thought Content: Thought content normal.        Cognition and Memory: Cognition normal.        Judgment: Judgment normal.    Review of Systems  Constitutional: Negative.   HENT: Negative.    Eyes: Negative.   Respiratory: Negative.    Cardiovascular: Negative.   Musculoskeletal: Negative.   Skin: Negative.   Neurological: Negative.   Psychiatric/Behavioral: Negative.     Blood pressure 107/89, pulse (!) 120,  temperature 98.2  F (36.8 C), temperature source Oral, resp. rate 18, SpO2 99 %. There is no height or weight on file to calculate BMI.   Expand All Collapse All  Behavioral Health Progress Note   Date and Time: 09/16/2022 10:11 AM Name: Nicole [name] MRN:  [id number]     HPI: Nicole [name] is a 31 year old female with a past psychiatric history significant for schizophrenia and major depressive disorder with psychosis who voluntarily presented to [facility] on 07/25/2022 via [facility] after a verbal altercation with Nicole [name] (not patient's legal guardian) for transient SI. This is patient's fourth visit to [facility] for similar concerns this year. Patient has been dismissed from previous group home due to threatening suicidal and homicidal ideations, then leaving the group home.   Legal Guardian: Mom Nicole [name]) transitioning to be Dad Nicole [name]) Point of contact: Dad Nicole [name]).   Nicole [name], 69 y.o., female patient seen face to face by this provider and chart reviewed on 09/16/22.    During evaluation Nicole [name] is laying in her bed asleep in no acute distress. She is easily awakened.  Reports that she slept well and tolerated the trazodone without any adverse reactions.  She is alert/oriented x 4, cooperative, calm and attentive.  She has normal speech and behavior.  She continues to deny depression, anxiety, SI/HI/AVH.  She does not appear to be responding to internal/external stimuli.  She is requesting to speak to the social worker concerning her placement.  States she does not understand why he is not getting placed anywhere.  Provided reassurance, encouragement and support.          Diagnosis:  Final diagnoses:  Severe episode of recurrent major depressive disorder, with psychotic features (HCC)      Total Time spent with patient: 30 minutes   Past Psychiatric History: see H&P Past Medical History:      Past Medical History:  Diagnosis Date   Anxiety      Depression     Hypothyroidism 08/07/2022   Tobacco use disorder 08/07/2022         Past Surgical History:  Procedure Laterality Date   WISDOM TOOTH EXTRACTION Bilateral 2020    Family History:       Family History  Problem Relation Age of Onset   Hypertension Father     Diabetes Father      Family Psychiatric  History: see H&P Social History:  Social History       Substance and Sexual Activity  Alcohol Use Never     Social History       Substance and Sexual Activity  Drug Use Never    Social History         Socioeconomic History   Marital status: Single      Spouse name: Not on file   Number of children: Not on file   Years of education: Not on file   Highest education level: Not on file  Occupational History   Not on file  Tobacco Use   Smoking status: Every Day      Types: Cigarettes   Smokeless tobacco: Never  Vaping Use   Vaping Use: Some days  Substance and Sexual Activity   Alcohol use: Never   Drug use: Never   Sexual activity: Yes      Partners: Female      Birth control/protection: Implant  Other Topics Concern   Not on file  Social History Narrative   Not on file    Social Determinants  of Health    Financial Resource Strain: Not on file  Food Insecurity: Not on file  Transportation Needs: Not on file  Physical Activity: Not on file  Stress: Not on file  Social Connections: Not on file    SDOH:  SDOH Screenings    Depression (PHQ2-9): Low Risk  (06/10/2022)  Tobacco Use: High Risk (09/01/2022)    Additional Social History:  Pain Medications: SEE MAR Prescriptions: SEE MAR Over the Counter: SEE MAR History of alcohol / drug use?: Yes Longest period of sobriety (when/how long): N/A Negative Consequences of Use:  (NONE) Withdrawal Symptoms: None Name of Substance 1: ETOH IN PAST--PT CURRENTLY DENIES 1 - Frequency: SOCIAL USE ETOH IN PAST 1 - Method of Aquiring: LEGAL 1- Route of Use: ORAL DRINK Name of Substance 2:  NICOTINE-VAPE AND CIGARETTES 2 - Amount (size/oz): LESS THAN ONE PACK 2 - Frequency: DAILY 2 - Method of Aquiring: LEGAL 2 - Route of Substance Use: ORAL SMOKE   Sleep: Good   Appetite:  Good   Current Medications:           Current Facility-Administered Medications  Medication Dose Route Frequency Provider Last Rate Last Admin   acetaminophen (TYLENOL) tablet 650 mg  650 mg Oral Q6H PRN Onuoha, Chinwendu V, NP   650 mg at 09/12/22 2105   alum & mag hydroxide-simeth (MAALOX/MYLANTA) 200-200-20 MG/5ML suspension 30 mL  30 mL Oral Q4H PRN Onuoha, Chinwendu V, NP   30 mL at 07/27/22 2151   ARIPiprazole ER (ABILIFY MAINTENA) injection 400 mg  400 mg Intramuscular Q28 days Princess Bruins, DO   400 mg at 08/30/22 1338   docusate sodium (COLACE) capsule 100 mg  100 mg Oral Daily [name], MD   100 mg at 09/16/22 0927   hydrOXYzine (ATARAX) tablet 25 mg  25 mg Oral TID PRN Onuoha, Chinwendu V, NP   25 mg at 09/15/22 2115   ibuprofen (ADVIL) tablet 400 mg  400 mg Oral Q6H PRN Onuoha, Chinwendu V, NP   400 mg at 09/06/22 1314   levothyroxine (SYNTHROID) tablet 100 mcg  100 mcg Oral Q0600 Princess Bruins, DO   100 mcg at 09/16/22 4098   metFORMIN (GLUCOPHAGE) tablet 500 mg  500 mg Oral Q breakfast Park Pope, MD   500 mg at 09/16/22 1191   nicotine (NICODERM CQ - dosed in mg/24 hours) patch 14 mg  14 mg Transdermal Q0600 Onuoha, Chinwendu V, NP   14 mg at 09/16/22 0928   ondansetron (ZOFRAN-ODT) disintegrating tablet 4 mg  4 mg Oral Q8H PRN Sindy Guadeloupe, NP   4 mg at 09/14/22 2351   Oxcarbazepine (TRILEPTAL) tablet 300 mg  300 mg Oral BID Onuoha, Chinwendu V, NP   300 mg at 09/16/22 0927   pantoprazole (PROTONIX) EC tablet 20 mg  20 mg Oral Daily Princess Bruins, DO   20 mg at 09/16/22 4782   QUEtiapine (SEROQUEL) tablet 400 mg  400 mg Oral BID Onuoha, Chinwendu V, NP   400 mg at 09/16/22 0927   sertraline (ZOLOFT) tablet 150 mg  150 mg Oral Daily Onuoha, Chinwendu V, NP   150 mg at 09/16/22 0927    traZODone (DESYREL) tablet 100 mg  100 mg Oral QHS Ardis Hughs, NP   100 mg at 09/15/22 2115   valACYclovir (VALTREX) tablet 500 mg  500 mg Oral Daily Princess Bruins, DO   500 mg at 09/16/22 9562   ziprasidone (GEODON) injection 20 mg  20 mg Intramuscular Q12H  PRN Onuoha, Chinwendu V, NP   20 mg at 09/13/22 2309          Current Outpatient Medications  Medication Sig Dispense Refill   ABILIFY MAINTENA 400 MG PRSY prefilled syringe 400 mg every 28 (twenty-eight) days.       cetirizine (ZYRTEC) 10 MG tablet Take 10 mg by mouth daily.       cyclobenzaprine (FLEXERIL) 10 MG tablet Take 1 tablet (10 mg total) by mouth 2 (two) times daily as needed for muscle spasms. 20 tablet 0   fluticasone (FLONASE) 50 MCG/ACT nasal spray Place 1 spray into both nostrils daily.       meloxicam (MOBIC) 15 MG tablet Take 15 mg by mouth daily.       Oxcarbazepine (TRILEPTAL) 300 MG tablet Take 1 tablet (300 mg total) by mouth 2 (two) times daily. 60 tablet 0   QUEtiapine (SEROQUEL) 400 MG tablet Take 1 tablet (400 mg total) by mouth 2 (two) times daily. 60 tablet 0   sertraline (ZOLOFT) 50 MG tablet Take 3 tablets (150 mg total) by mouth in the morning. 90 tablet 0   traZODone (DESYREL) 100 MG tablet Take 1 tablet (100 mg total) by mouth at bedtime. 30 tablet 0   valACYclovir (VALTREX) 500 MG tablet Take 500 mg by mouth daily.          Labs  Lab Results:          Admission on 07/25/2022  Component Date Value Ref Range Status    SARS Coronavirus 2 by RT PCR 07/25/2022 NEGATIVE  NEGATIVE Final    Comment: (NOTE) SARS-CoV-2 target nucleic acids are NOT DETECTED.   The SARS-CoV-2 RNA is generally detectable in upper respiratory specimens during the acute phase of infection. The lowest concentration of SARS-CoV-2 viral copies this assay can detect is 138 copies/mL. A negative result does not preclude SARS-Cov-2 infection and should not be used as the sole basis for treatment or other patient management  decisions. A negative result may occur with  improper specimen collection/handling, submission of specimen other than nasopharyngeal swab, presence of viral mutation(s) within the areas targeted by this assay, and inadequate number of viral copies(<138 copies/mL). A negative result must be combined with clinical observations, patient history, and epidemiological information. The expected result is Negative.   Fact Sheet for Patients:  BloggerCourse.com   Fact Sheet for Healthcare Providers:  SeriousBroker.it   This test is no                           t yet approved or cleared by the [place] FDA and  has been authorized for detection and/or diagnosis of SARS-CoV-2 by FDA under an Emergency Use Authorization (EUA). This EUA will remain  in effect (meaning this test can be used) for the duration of the COVID-19 declaration under Section 564(b)(1) of the Act, 21 U.S.C.section 360bbb-3(b)(1), unless the authorization is terminated  or revoked sooner.          Influenza A by PCR 07/25/2022 NEGATIVE  NEGATIVE Final   Influenza B by PCR 07/25/2022 NEGATIVE  NEGATIVE Final    Comment: (NOTE) The Xpert Xpress SARS-CoV-2/FLU/RSV plus assay is intended as an aid in the diagnosis of influenza from Nasopharyngeal swab specimens and should not be used as a sole basis for treatment. Nasal washings and aspirates are unacceptable for Xpert Xpress SARS-CoV-2/FLU/RSV testing.   Fact Sheet for Patients: BloggerCourse.com   Fact Sheet for Healthcare  Providers: SeriousBroker.it   This test is not yet approved or cleared by the [place] and has been authorized for detection and/or diagnosis of SARS-CoV-2 by FDA under an Emergency Use Authorization (EUA). This EUA will remain in effect (meaning this test can be used) for the duration of the COVID-19 declaration under Section 564(b)(1)  of the Act, 21 U.S.C. section 360bbb-3(b)(1), unless the authorization is terminated or revoked.   Performed at [facility], [address] [place], [place] [address]     WBC 07/25/2022 8.3  4.0 - 10.5 K/uL Final   RBC 07/25/2022 4.44  3.87 - 5.11 MIL/uL Final   Hemoglobin 07/25/2022 13.7  12.0 - 15.0 g/dL Final   HCT 60/45/4098 40.2  36.0 - 46.0 % Final   MCV 07/25/2022 90.5  80.0 - 100.0 fL Final   MCH 07/25/2022 30.9  26.0 - 34.0 pg Final   MCHC 07/25/2022 34.1  30.0 - 36.0 g/dL Final   RDW 11/91/4782 12.2  11.5 - 15.5 % Final   Platelets 07/25/2022 248  150 - 400 K/uL Final   nRBC 07/25/2022 0.0  0.0 - 0.2 % Final   Neutrophils Relative % 07/25/2022 43  % Final   Neutro Abs 07/25/2022 3.6  1.7 - 7.7 K/uL Final   Lymphocytes Relative 07/25/2022 52  % Final   Lymphs Abs 07/25/2022 4.2 (H)  0.7 - 4.0 K/uL Final   Monocytes Relative 07/25/2022 5  % Final   Monocytes Absolute 07/25/2022 0.4  0.1 - 1.0 K/uL Final   Eosinophils Relative 07/25/2022 0  % Final   Eosinophils Absolute 07/25/2022 0.0  0.0 - 0.5 K/uL Final   Basophils Relative 07/25/2022 0  % Final   Basophils Absolute 07/25/2022 0.0  0.0 - 0.1 K/uL Final   Immature Granulocytes 07/25/2022 0  % Final   Abs Immature Granulocytes 07/25/2022 0.02  0.00 - 0.07 K/uL Final    Performed at [facility], [address], [place], [place] [address]   Sodium 07/25/2022 138  135 - 145 mmol/L Final   Potassium 07/25/2022 4.0  3.5 - 5.1 mmol/L Final   Chloride 07/25/2022 104  98 - 111 mmol/L Final   CO2 07/25/2022 29  22 - 32 mmol/L Final   Glucose, Bld 07/25/2022 83  70 - 99 mg/dL Final    Glucose reference range applies only to samples taken after fasting for at least 8 hours.   BUN 07/25/2022 11  6 - 20 mg/dL Final   Creatinine, Ser 07/25/2022 0.97  0.44 - 1.00 mg/dL Final   Calcium 30/86/5784 9.2  8.9 - 10.3 mg/dL Final   Total Protein 69/62/9528 7.0  6.5 - 8.1 g/dL Final   Albumin 41/32/4401 3.8  3.5 - 5.0 g/dL  Final   AST 02/72/5366 18  15 - 41 U/L Final   ALT 07/25/2022 22  0 - 44 U/L Final   Alkaline Phosphatase 07/25/2022 64  38 - 126 U/L Final   Total Bilirubin 07/25/2022 0.2 (L)  0.3 - 1.2 mg/dL Final   GFR, Estimated 07/25/2022 >60  >60 mL/min Final    Comment: (NOTE) Calculated using the CKD-EPI Creatinine Equation (2021)     Anion gap 07/25/2022 5  5 - 15 Final    Performed at [facility], [address], [place], [place] [address]   POC Amphetamine UR 07/25/2022 None Detected  NONE DETECTED (Cut Off Level 1000 ng/mL) Preliminary   POC Secobarbital (BAR) 07/25/2022 None Detected  NONE DETECTED (Cut Off Level 300 ng/mL) Preliminary   POC Buprenorphine (BUP) 07/25/2022 None Detected  NONE DETECTED (Cut Off Level 10 ng/mL) Preliminary   POC Oxazepam (BZO) 07/25/2022 None Detected  NONE DETECTED (Cut Off Level 300 ng/mL) Preliminary   POC Cocaine UR 07/25/2022 None Detected  NONE DETECTED (Cut Off Level 300 ng/mL) Preliminary   POC Methamphetamine UR 07/25/2022 None Detected  NONE DETECTED (Cut Off Level 1000 ng/mL) Preliminary   POC Morphine 07/25/2022 None Detected  NONE DETECTED (Cut Off Level 300 ng/mL) Preliminary   POC Methadone UR 07/25/2022 None Detected  NONE DETECTED (Cut Off Level 300 ng/mL) Preliminary   POC Oxycodone UR 07/25/2022 None Detected  NONE DETECTED (Cut Off Level 100 ng/mL) Preliminary   POC Marijuana UR 07/25/2022 None Detected  NONE DETECTED (Cut Off Level 50 ng/mL) Preliminary   SARSCOV2ONAVIRUS 2 AG 07/25/2022 NEGATIVE  NEGATIVE Final    Comment: (NOTE) SARS-CoV-2 antigen NOT DETECTED.    Negative results are presumptive.  Negative results do not preclude SARS-CoV-2 infection and should not be used as the sole basis for treatment or other patient management decisions, including infection  control decisions, particularly in the presence of clinical signs and  symptoms consistent with COVID-19, or in those who have been in contact with the virus.  Negative  results must be combined with clinical observations, patient history, and epidemiological information. The expected result is Negative.   Fact Sheet for Patients: https://www.jennings-kim.com/   Fact Sheet for Healthcare Providers: https://alexander-rogers.biz/   This test is not yet approved or cleared by the [place] FDA and  has been authorized for detection and/or diagnosis of SARS-CoV-2 by FDA under an Emergency Use Authorization (EUA).  This EUA will remain in effect (meaning this test can be used) for the duration of  the COV                           ID-19 declaration under Section 564(b)(1) of the Act, 21 U.S.C. section 360bbb-3(b)(1), unless the authorization is terminated or revoked sooner.       Cholesterol 07/25/2022 181  0 - 200 mg/dL Final   Triglycerides 16/07/9603 118  <150 mg/dL Final   HDL 54/06/8118 45  >40 mg/dL Final   Total CHOL/HDL Ratio 07/25/2022 4.0  RATIO Final   VLDL 07/25/2022 24  0 - 40 mg/dL Final   LDL Cholesterol 07/25/2022 112 (H)  0 - 99 mg/dL Final    Comment:        Total Cholesterol/HDL:CHD Risk Coronary Heart Disease Risk Table                     Men   Women  1/2 Average Risk   3.4   3.3  Average Risk       5.0   4.4  2 X Average Risk   9.6   7.1  3 X Average Risk  23.4   11.0        Use the calculated Patient Ratio above and the CHD Risk Table to determine the patient's CHD Risk.        ATP III CLASSIFICATION (LDL):  <100     mg/dL   Optimal  147-829  mg/dL   Near or Above                    Optimal  130-159  mg/dL   Borderline  562-130  mg/dL   High  >  190     mg/dL   Very High Performed at [facility], [address], [place], [place] [address]     TSH 07/25/2022 6.668 (H)  0.350 - 4.500 uIU/mL Final    Comment: Performed by a 3rd Generation assay with a functional sensitivity of <=0.01 uIU/mL. Performed at [facility], [address], [place], [place] [address]      Glucose-Capillary 07/26/2022 104 (H)  70 - 99 mg/dL Final    Glucose reference range applies only to samples taken after fasting for at least 8 hours.   T3, Free 07/31/2022 2.3  2.0 - 4.4 pg/mL Final    Comment: (NOTE) Performed At: [facility] [address] [place], [place] [id number] Jolene Schimke MD WG:[id number]     Free T4 07/31/2022 0.60 (L)  0.61 - 1.12 ng/dL Final    Comment: (NOTE) Biotin ingestion may interfere with free T4 tests. If the results are inconsistent with the TSH level, previous test results, or the clinical presentation, then consider biotin interference. If needed, order repeat testing after stopping biotin. Performed at [facility], [address], [place], [place] [address]     Glucose-Capillary 08/29/2022 100 (H)  70 - 99 mg/dL Final    Glucose reference range applies only to samples taken after fasting for at least 8 hours.   TSH 09/05/2022 0.793  0.350 - 4.500 uIU/mL Final    Comment: Performed by a 3rd Generation assay with a functional sensitivity of <=0.01 uIU/mL. Performed at [facility], [address], [place], [place] [address]     Free T4 09/05/2022 0.73  0.61 - 1.12 ng/dL Final    Comment: (NOTE) Biotin ingestion may interfere with free T4 tests. If the results are inconsistent with the TSH level, previous test results, or the clinical presentation, then consider biotin interference. If needed, order repeat testing after stopping biotin. Performed at [facility], [address] [place], [place] [address]     Preg Test, Ur 09/06/2022 Negative  Negative Final   Preg Test, Ur 09/06/2022 NEGATIVE  NEGATIVE Final    Comment:        THE SENSITIVITY OF THIS METHODOLOGY IS >24 mIU/mL     Preg Test, Ur 09/05/2022 NEGATIVE  NEGATIVE Final    Comment:        THE SENSITIVITY OF THIS METHODOLOGY IS >24 mIU/mL     Sodium 09/13/2022 136  135 - 145 mmol/L Final   Potassium 09/13/2022 4.5  3.5 - 5.1 mmol/L Final    Chloride 09/13/2022 105  98 - 111 mmol/L Final   CO2 09/13/2022 21 (L)  22 - 32 mmol/L Final   Glucose, Bld 09/13/2022 111 (H)  70 - 99 mg/dL Final    Glucose reference range applies only to samples taken after fasting for at least 8 hours.   BUN 09/13/2022 14  6 - 20 mg/dL Final   Creatinine, Ser 09/13/2022 0.92  0.44 - 1.00 mg/dL Final   Calcium 44/10/270 9.1  8.9 - 10.3 mg/dL Final   GFR, Estimated 09/13/2022 >60  >60 mL/min Final    Comment: (NOTE) Calculated using the CKD-EPI Creatinine Equation (2021)     Anion gap 09/13/2022 10  5 - 15 Final    Performed at [facility], [address], [place], [place] [address]   Vitamin B-12 09/13/2022 585  180 - 914 pg/mL Final    Comment: (NOTE) This assay is not validated  for testing neonatal or myeloproliferative syndrome specimens for Vitamin B12 levels. Performed at  [facility], [address], [place], [place] [address]     Color, Urine 09/14/2022 YELLOW  YELLOW Final   APPearance 09/14/2022 HAZY (A)  CLEAR Final   Specific Gravity, Urine 09/14/2022 1.025  1.005 - 1.030 Final   pH 09/14/2022 5.0  5.0 - 8.0 Final   Glucose, UA 09/14/2022 NEGATIVE  NEGATIVE mg/dL Final   Hgb urine dipstick 09/14/2022 NEGATIVE  NEGATIVE Final   Bilirubin Urine 09/14/2022 NEGATIVE  NEGATIVE Final   Ketones, ur 09/14/2022 NEGATIVE  NEGATIVE mg/dL Final   Protein, ur 60/45/4098 NEGATIVE  NEGATIVE mg/dL Final   Nitrite 11/91/4782 NEGATIVE  NEGATIVE Final   Leukocytes,Ua 09/14/2022 TRACE (A)  NEGATIVE Final   RBC / HPF 09/14/2022 0-5  0 - 5 RBC/hpf Final   WBC, UA 09/14/2022 0-5  0 - 5 WBC/hpf Final   Bacteria, UA 09/14/2022 RARE (A)  NONE SEEN Final   Squamous Epithelial / LPF 09/14/2022 0-5  0 - 5 Final   Mucus 09/14/2022 PRESENT    Final    Performed at [facility], [address] [place], [place] [address]  Admission on 07/04/2022, Discharged on 07/05/2022  Component Date Value Ref Range Status    Sodium 07/04/2022 142  135 -  145 mmol/L Final   Potassium 07/04/2022 4.4  3.5 - 5.1 mmol/L Final   Chloride 07/04/2022 109  98 - 111 mmol/L Final   CO2 07/04/2022 26  22 - 32 mmol/L Final   Glucose, Bld 07/04/2022 96  70 - 99 mg/dL Final    Glucose reference range applies only to samples taken after fasting for at least 8 hours.   BUN 07/04/2022 15  6 - 20 mg/dL Final   Creatinine, Ser 07/04/2022 0.83  0.44 - 1.00 mg/dL Final   Calcium 30/86/5784 9.3  8.9 - 10.3 mg/dL Final   Total Protein 69/62/9528 7.2  6.5 - 8.1 g/dL Final   Albumin 41/32/4401 3.9  3.5 - 5.0 g/dL Final   AST 02/72/5366 23  15 - 41 U/L Final   ALT 07/04/2022 28  0 - 44 U/L Final   Alkaline Phosphatase 07/04/2022 77  38 - 126 U/L Final   Total Bilirubin 07/04/2022 0.4  0.3 - 1.2 mg/dL Final   GFR, Estimated 07/04/2022 >60  >60 mL/min Final    Comment: (NOTE) Calculated using the CKD-EPI Creatinine Equation (2021)     Anion gap 07/04/2022 7  5 - 15 Final    Performed at [facility], [address] [place], [place] [address]   Alcohol, Ethyl (B) 07/04/2022 <10  <10 mg/dL Final    Comment: (NOTE) Lowest detectable limit for serum alcohol is 10 mg/dL.   For medical purposes only. Performed at [facility], [address] [place], [place] [address]     WBC 07/04/2022 7.0  4.0 - 10.5 K/uL Final   RBC 07/04/2022 4.18  3.87 - 5.11 MIL/uL Final   Hemoglobin 07/04/2022 12.8  12.0 - 15.0 g/dL Final   HCT 56/38/7564 39.1  36.0 - 46.0 % Final   MCV 07/04/2022 93.5  80.0 - 100.0 fL Final   MCH 07/04/2022 30.6  26.0 - 34.0 pg Final   MCHC 07/04/2022 32.7  30.0 - 36.0 g/dL Final   RDW 33/29/5188 12.9  11.5 - 15.5 % Final   Platelets 07/04/2022 243  150 - 400 K/uL Final   nRBC 07/04/2022 0.0  0.0 - 0.2 % Final   Neutrophils Relative % 07/04/2022 43  % Final   Neutro Abs 07/04/2022 3.0  1.7 - 7.7 K/uL Final   Lymphocytes Relative 07/04/2022 50  % Final   Lymphs Abs 07/04/2022 3.5  0.7 - 4.0 K/uL Final    Monocytes Relative 07/04/2022 7  % Final   Monocytes Absolute 07/04/2022 0.5  0.1 - 1.0 K/uL Final   Eosinophils Relative 07/04/2022 0  % Final   Eosinophils Absolute 07/04/2022 0.0  0.0 - 0.5 K/uL Final   Basophils Relative 07/04/2022 0  % Final   Basophils Absolute 07/04/2022 0.0  0.0 - 0.1 K/uL Final   Immature Granulocytes 07/04/2022 0  % Final   Abs Immature Granulocytes 07/04/2022 0.01  0.00 - 0.07 K/uL Final    Performed at [facility], [address] [place], [place] [id number]   I-stat hCG, quantitative 07/04/2022 <5.0  <5 mIU/mL Final   Comment 3 07/04/2022           Final    Comment:   GEST. AGE      CONC.  (mIU/mL)   <=1 WEEK        5 - 50     2 WEEKS       50 - 500     3 WEEKS       100 - 10,000     4 WEEKS     1,000 - 30,000        FEMALE AND NON-PREGNANT FEMALE:     LESS THAN 5 mIU/mL    Admission on 06/14/2022, Discharged on 06/14/2022  Component Date Value Ref Range Status    Color, UA 06/14/2022 yellow  yellow Final   Clarity, UA 06/14/2022 cloudy (A)  clear Final   Glucose, UA 06/14/2022 negative  negative mg/dL Final   Bilirubin, UA 84/69/629509/10/2021 negative  negative Final   Ketones, POC UA 06/14/2022 negative  negative mg/dL Final   Spec Grav, UA 28/41/324409/10/2021 1.025  1.010 - 1.025 Final   Blood, UA 06/14/2022 negative  negative Final   pH, UA 06/14/2022 7.5  5.0 - 8.0 Final   Protein Ur, POC 06/14/2022 =30 (A)  negative mg/dL Final   Urobilinogen, UA 06/14/2022 0.2  0.2 or 1.0 E.U./dL Final   Nitrite, UA 01/02/725309/10/2021 Negative  Negative Final   Leukocytes, UA 06/14/2022 Negative  Negative Final   Preg Test, Ur 06/14/2022 Negative  Negative Final   Specimen Description 06/14/2022 URINE, CLEAN CATCH    Final   Special Requests 06/14/2022     Final                   Value:NONE Performed at [facility], [address] [place], [place] [id number]     Culture 06/14/2022 MULTIPLE SPECIES PRESENT, SUGGEST RECOLLECTION (A)    Final   Report Status  06/14/2022 06/16/2022 FINAL    Final  Admission on 06/07/2022, Discharged on 06/10/2022  Component Date Value Ref Range Status    SARS Coronavirus 2 by RT PCR 06/07/2022 NEGATIVE  NEGATIVE Final    Comment: (NOTE) SARS-CoV-2 target nucleic acids are NOT DETECTED.   The SARS-CoV-2 RNA is generally detectable in upper respiratory specimens during the acute phase of infection. The lowest concentration of SARS-CoV-2 viral copies this assay can detect is 138 copies/mL. A negative result does not preclude SARS-Cov-2 infection and should not be used as the sole basis for treatment or other patient management decisions. A negative result  may occur with  improper specimen collection/handling, submission of specimen other than nasopharyngeal swab, presence of viral mutation(s) within the areas targeted by this assay, and inadequate number of viral copies(<138 copies/mL). A negative result must be combined with clinical observations, patient history, and epidemiological information. The expected result is Negative.   Fact Sheet for Patients:  BloggerCourse.com   Fact Sheet for Healthcare Providers:  SeriousBroker.it   This test is no                           t yet approved or cleared by the [place] FDA and  has been authorized for detection and/or diagnosis of SARS-CoV-2 by FDA under an Emergency Use Authorization (EUA). This EUA will remain  in effect (meaning this test can be used) for the duration of the COVID-19 declaration under Section 564(b)(1) of the Act, 21 U.S.C.section 360bbb-3(b)(1), unless the authorization is terminated  or revoked sooner.          Influenza A by PCR 06/07/2022 NEGATIVE  NEGATIVE Final   Influenza B by PCR 06/07/2022 NEGATIVE  NEGATIVE Final    Comment: (NOTE) The Xpert Xpress SARS-CoV-2/FLU/RSV plus assay is intended as an aid in the diagnosis of influenza from Nasopharyngeal swab specimens  and should not be used as a sole basis for treatment. Nasal washings and aspirates are unacceptable for Xpert Xpress SARS-CoV-2/FLU/RSV testing.   Fact Sheet for Patients: BloggerCourse.com   Fact Sheet for Healthcare Providers: SeriousBroker.it   This test is not yet approved or cleared by the [place] FDA and has been authorized for detection and/or diagnosis of SARS-CoV-2 by FDA under an Emergency Use Authorization (EUA). This EUA will remain in effect (meaning this test can be used) for the duration of the COVID-19 declaration under Section 564(b)(1) of the Act, 21 U.S.C. section 360bbb-3(b)(1), unless the authorization is terminated or revoked.   Performed at [facility], [address], [place], [place] [address]     WBC 06/07/2022 5.7  4.0 - 10.5 K/uL Final   RBC 06/07/2022 4.39  3.87 - 5.11 MIL/uL Final   Hemoglobin 06/07/2022 13.2  12.0 - 15.0 g/dL Final   HCT 60/45/4098 40.4  36.0 - 46.0 % Final   MCV 06/07/2022 92.0  80.0 - 100.0 fL Final   MCH 06/07/2022 30.1  26.0 - 34.0 pg Final   MCHC 06/07/2022 32.7  30.0 - 36.0 g/dL Final   RDW 11/91/4782 12.4  11.5 - 15.5 % Final   Platelets 06/07/2022 308  150 - 400 K/uL Final   nRBC 06/07/2022 0.0  0.0 - 0.2 % Final   Neutrophils Relative % 06/07/2022 42  % Final   Neutro Abs 06/07/2022 2.4  1.7 - 7.7 K/uL Final   Lymphocytes Relative 06/07/2022 54  % Final   Lymphs Abs 06/07/2022 3.1  0.7 - 4.0 K/uL Final   Monocytes Relative 06/07/2022 4  % Final   Monocytes Absolute 06/07/2022 0.2  0.1 - 1.0 K/uL Final   Eosinophils Relative 06/07/2022 0  % Final   Eosinophils Absolute 06/07/2022 0.0  0.0 - 0.5 K/uL Final   Basophils Relative 06/07/2022 0  % Final   Basophils Absolute 06/07/2022 0.0  0.0 - 0.1 K/uL Final   Immature Granulocytes 06/07/2022 0  % Final   Abs Immature Granulocytes 06/07/2022 0.01  0.00 - 0.07 K/uL Final    Performed at [facility],  [address], [place], [place] [address]  Sodium 06/07/2022 139  135 - 145 mmol/L Final   Potassium 06/07/2022 4.0  3.5 - 5.1 mmol/L Final   Chloride 06/07/2022 104  98 - 111 mmol/L Final   CO2 06/07/2022 28  22 - 32 mmol/L Final   Glucose, Bld 06/07/2022 104 (H)  70 - 99 mg/dL Final    Glucose reference range applies only to samples taken after fasting for at least 8 hours.   BUN 06/07/2022 8  6 - 20 mg/dL Final   Creatinine, Ser 06/07/2022 0.84  0.44 - 1.00 mg/dL Final   Calcium 47/82/9562 9.1  8.9 - 10.3 mg/dL Final   Total Protein 13/05/6577 6.9  6.5 - 8.1 g/dL Final   Albumin 46/96/2952 3.7  3.5 - 5.0 g/dL Final   AST 84/13/2440 19  15 - 41 U/L Final   ALT 06/07/2022 22  0 - 44 U/L Final   Alkaline Phosphatase 06/07/2022 54  38 - 126 U/L Final   Total Bilirubin 06/07/2022 0.4  0.3 - 1.2 mg/dL Final   GFR, Estimated 06/07/2022 >60  >60 mL/min Final    Comment: (NOTE) Calculated using the CKD-EPI Creatinine Equation (2021)     Anion gap 06/07/2022 7  5 - 15 Final    Performed at [facility], [address], [place], [place] [address]   Hgb A1c MFr Bld 06/07/2022 5.0  4.8 - 5.6 % Final    Comment: (NOTE) Pre diabetes:          5.7%-6.4%   Diabetes:              >6.4%   Glycemic control for   <7.0% adults with diabetes     Mean Plasma Glucose 06/07/2022 96.8  mg/dL Final    Performed at [facility], [address] [place], [place] [address]   TSH 06/07/2022 1.620  0.350 - 4.500 uIU/mL Final    Comment: Performed by a 3rd Generation assay with a functional sensitivity of <=0.01 uIU/mL. Performed at [facility], [address] [place], [place] [address]     RPR Ser Ql 06/07/2022 NON REACTIVE  NON REACTIVE Final    Performed at [facility], [address], [place], [place] [address]   Color, Urine 06/07/2022 YELLOW  YELLOW Final   APPearance 06/07/2022 HAZY (A)  CLEAR Final   Specific Gravity, Urine 06/07/2022 1.018  1.005 - 1.030 Final   pH  06/07/2022 7.0  5.0 - 8.0 Final   Glucose, UA 06/07/2022 NEGATIVE  NEGATIVE mg/dL Final   Hgb urine dipstick 06/07/2022 NEGATIVE  NEGATIVE Final   Bilirubin Urine 06/07/2022 NEGATIVE  NEGATIVE Final   Ketones, ur 06/07/2022 NEGATIVE  NEGATIVE mg/dL Final   Protein, ur 63/87/5643 NEGATIVE  NEGATIVE mg/dL Final   Nitrite 32/95/1884 NEGATIVE  NEGATIVE Final   Leukocytes,Ua 06/07/2022 NEGATIVE  NEGATIVE Final    Performed at [facility], [address] [place], [place] [address]   Cholesterol 06/07/2022 164  0 - 200 mg/dL Final   Triglycerides 30/16/0109 158 (H)  <150 mg/dL Final   HDL 32/35/5732 44  >40 mg/dL Final   Total CHOL/HDL Ratio 06/07/2022 3.7  RATIO Final   VLDL 06/07/2022 32  0 - 40 mg/dL Final   LDL Cholesterol 06/07/2022 88  0 - 99 mg/dL Final    Comment:        Total Cholesterol/HDL:CHD Risk Coronary Heart Disease Risk Table  Men   Women  1/2 Average Risk   3.4   3.3  Average Risk       5.0   4.4  2 X Average Risk   9.6   7.1  3 X Average Risk  23.4   11.0        Use the calculated Patient Ratio above and the CHD Risk Table to determine the patient's CHD Risk.        ATP III CLASSIFICATION (LDL):  <100     mg/dL   Optimal  782-956  mg/dL   Near or Above                    Optimal  130-159  mg/dL   Borderline  213-086  mg/dL   High  >578     mg/dL   Very High Performed at [facility], [address] [place], [place] [address]     HIV Screen 4th Generation wRfx 06/07/2022 Non Reactive  Non Reactive Final    Performed at [facility], [address], [place], [place] [address]   SARSCOV2ONAVIRUS 2 AG 06/07/2022 NEGATIVE  NEGATIVE Final    Comment: (NOTE) SARS-CoV-2 antigen NOT DETECTED.    Negative results are presumptive.  Negative results do not preclude SARS-CoV-2 infection and should not be used as the sole basis for treatment or other patient management decisions, including infection  control decisions, particularly  in the presence of clinical signs and  symptoms consistent with COVID-19, or in those who have been in contact with the virus.  Negative results must be combined with clinical observations, patient history, and epidemiological information. The expected result is Negative.   Fact Sheet for Patients: https://www.jennings-kim.com/   Fact Sheet for Healthcare Providers: https://alexander-rogers.biz/   This test is not yet approved or cleared by the [place] FDA and  has been authorized for detection and/or diagnosis of SARS-CoV-2 by FDA under an Emergency Use Authorization (EUA).  This EUA will remain in effect (meaning this test can be used) for the duration of  the COV                           ID-19 declaration under Section 564(b)(1) of the Act, 21 U.S.C. section 360bbb-3(b)(1), unless the authorization is terminated or revoked sooner.       POC Amphetamine UR 06/07/2022 None Detected  NONE DETECTED (Cut Off Level 1000 ng/mL) Final   POC Secobarbital (BAR) 06/07/2022 None Detected  NONE DETECTED (Cut Off Level 300 ng/mL) Final   POC Buprenorphine (BUP) 06/07/2022 None Detected  NONE DETECTED (Cut Off Level 10 ng/mL) Final   POC Oxazepam (BZO) 06/07/2022 None Detected  NONE DETECTED (Cut Off Level 300 ng/mL) Final   POC Cocaine UR 06/07/2022 None Detected  NONE DETECTED (Cut Off Level 300 ng/mL) Final   POC Methamphetamine UR 06/07/2022 None Detected  NONE DETECTED (Cut Off Level 1000 ng/mL) Final   POC Morphine 06/07/2022 None Detected  NONE DETECTED (Cut Off Level 300 ng/mL) Final   POC Methadone UR 06/07/2022 None Detected  NONE DETECTED (Cut Off Level 300 ng/mL) Final   POC Oxycodone UR 06/07/2022 None Detected  NONE DETECTED (Cut Off Level 100 ng/mL) Final   POC Marijuana UR 06/07/2022 None Detected  NONE DETECTED (Cut Off Level 50 ng/mL) Final   Preg Test, Ur 06/08/2022 NEGATIVE  NEGATIVE Final    Comment:  THE SENSITIVITY OF THIS METHODOLOGY  IS >20 mIU/mL. Performed at [facility], [address], [place], [place] [address]     Preg Test, Ur 06/08/2022 NEGATIVE  NEGATIVE Final    Comment:        THE SENSITIVITY OF THIS METHODOLOGY IS >24 mIU/mL        Blood Alcohol level:  Recent Labs       Lab Results  Component Value Date    ETH <10 07/04/2022    ETH <10 02/07/2022        Metabolic Disorder Labs: Recent Labs       Lab Results  Component Value Date    HGBA1C 5.0 06/07/2022    MPG 96.8 06/07/2022    MPG 96.8 02/07/2022      Recent Labs  No results found for: "PROLACTIN"   Recent Labs       Lab Results  Component Value Date    CHOL 181 07/25/2022    TRIG 118 07/25/2022    HDL 45 07/25/2022    CHOLHDL 4.0 07/25/2022    VLDL 24 07/25/2022    LDLCALC 112 (H) 07/25/2022    LDLCALC 88 06/07/2022        Therapeutic Lab Levels: Recent Labs  No results found for: "LITHIUM"   Recent Labs  No results found for: "VALPROATE"   Recent Labs  No results found for: "CBMZ"     Physical Findings    GAD-7     Flowsheet Row Office Visit [facility]/17/2021 [facility]  Total GAD-7 Score 17         PHQ2-9     Flowsheet Row ED from 06/07/2022 in [facility] Office Visit from 12/17/2021 in [facility]  PHQ-2 Total Score 1 2  PHQ-9 Total Score 2 8         Flowsheet Row ED from 07/25/2022 in [facility] ED from 07/04/2022 in [facility] ED from 06/14/2022 in [facility]   C-SSRS RISK CATEGORY High Risk No Risk No Risk           Musculoskeletal  Strength & Muscle Tone: within normal limits Gait & Station: normal Patient leans: N/A   Psychiatric Specialty Exam  Presentation  General Appearance:  Appropriate for Environment; Casual   Eye Contact: Good   Speech: Clear and Coherent; Normal Rate   Speech  Volume: Normal   Handedness: Right     Mood and Affect  Mood: Euthymic   Affect: Congruent     Thought Process  Thought Processes: Coherent   Descriptions of Associations:Intact   Orientation:Full (Time, Place and Person)   Thought Content:Logical  Diagnosis of Schizophrenia or Schizoaffective disorder in past: Yes  Duration of Psychotic Symptoms: Greater than six months    Hallucinations:Hallucinations: None   Ideas of Reference:None   Suicidal Thoughts:Suicidal Thoughts: No   Homicidal Thoughts:Homicidal Thoughts: No     Sensorium  Memory: Immediate Good; Recent Good; Remote Good   Judgment: Fair   Insight: Fair     Art therapist  Concentration: Good   Attention Span: Good   Recall: Good   Fund of Knowledge: Good   Language: Good     Psychomotor Activity  Psychomotor Activity: Psychomotor Activity: Normal     Assets  Assets: Physical Health; Communication Skills; Desire for Improvement; Resilience; Leisure Time     Sleep  Sleep: Sleep: Good     No data recorded   Physical Exam  Physical Exam Vitals and nursing note reviewed.  Constitutional:      General: She is not in acute distress.    Appearance: Normal appearance. She is not ill-appearing.  HENT:     Head: Normocephalic.  Eyes:     General:        Right eye: No discharge.        Left eye: No discharge.     Conjunctiva/sclera: Conjunctivae normal.  Cardiovascular:     Rate and Rhythm: Normal rate.  Pulmonary:     Effort: Pulmonary effort is normal.  Musculoskeletal:        General: Normal range of motion.     Cervical back: Normal range of motion.  Skin:    Coloration: Skin is not jaundiced or pale.  Neurological:     Mental Status: She is alert and oriented to person, place, and time.  Psychiatric:        Attention and Perception: Attention and perception normal.        Mood and Affect: Affect normal.        Speech: Speech normal.        Behavior:  Behavior normal.        Thought Content: Thought content normal.        Cognition and Memory: Cognition normal.        Judgment: Judgment normal.      Review of Systems  Constitutional: Negative.   HENT: Negative.    Eyes: Negative.   Respiratory: Negative.    Cardiovascular: Negative.   Gastrointestinal: Negative.   Musculoskeletal: Negative.   Skin: Negative.   Neurological: Negative.   Psychiatric/Behavioral: Negative.      Blood pressure 106/68, pulse (!) 104, temperature 98.3 F (36.8 C), temperature source Oral, resp. rate 16, SpO2 99 %. There is no height or weight on file to calculate BMI.   Treatment Plan Summary:   Will continue to have daily contact with patient to assess and evaluate symptoms and progress in treatment.    Patient remains cleared by psychiatry. Disposition is pending placement, transition of care staff pursuing disposition plan        Ardis Hughs, NP 09/17/2022 10:57 PM

## 2022-09-17 NOTE — ED Notes (Signed)
Pt awake, & resting at present, no distress noted.  Calm & cooperative.  Monitoring for safety.

## 2022-09-18 ENCOUNTER — Encounter (HOSPITAL_COMMUNITY): Payer: Self-pay | Admitting: Registered Nurse

## 2022-09-18 MED ORDER — POLYETHYLENE GLYCOL 3350 17 G PO PACK
17.0000 g | PACK | Freq: Once | ORAL | Status: AC
  Administered 2022-09-18: 17 g via ORAL
  Filled 2022-09-18: qty 1

## 2022-09-18 MED ORDER — LORATADINE 10 MG PO TABS
10.0000 mg | ORAL_TABLET | Freq: Every day | ORAL | Status: DC
  Administered 2022-09-18 – 2022-10-07 (×20): 10 mg via ORAL
  Filled 2022-09-18 (×20): qty 1

## 2022-09-18 NOTE — ED Notes (Signed)
Pt sleeping at present, no distress noted.  Monitoring for safety. 

## 2022-09-18 NOTE — ED Notes (Addendum)
[name]&o x3. Denies SI/HI/AVH,Denies withdrawal sx now..Denies intent or plan  to harm self or others. Routine conducted according to faculty protocol .Encourge patient to notify  staff with any needs or concerns. Patient verbalized agreement and understanding Will continue to monitor for safety.

## 2022-09-18 NOTE — ED Notes (Signed)
Patient  sleeping in no acute stress. RR even and unlabored .Environment secured .Will continue to monitor for safely. 

## 2022-09-18 NOTE — ED Notes (Signed)
Patient states that her eyes are watery and that she is sneezing and that she would like something for it. Her temperature was 99 orally. Notified provider.

## 2022-09-18 NOTE — ED Provider Notes (Signed)
Behavioral Health Progress Note  Date and Time: 09/18/2022 11:07 AM Name: Nicole [name] MRN:  [id number]   HPI: Nicole [name] is a 31 year old female with a past psychiatric history significant for schizophrenia and major depressive disorder with psychosis who voluntarily presented to [facility] on 07/25/2022 via [facility] after a verbal altercation with [name] (not patient's legal guardian) for transient SI. This is patient's fourth visit to [facility] for similar concerns this year. Patient has been dismissed from previous group home due to threatening suicidal and homicidal ideations, then leaving the group home.   Legal Guardian: [name].    [name], 47 y.o., female patient seen face to face by this provider, consulted with Dr. Nelly Rout; and chart reviewed on 09/18/22.  On evaluation Nicole [name] reports there are no changes other than having trouble with bowel movement.  Patient reports that "it feels soft but I cannot push it out" (referring to stool).  Informed would order something for her to take today Miralax to help with constipation.  Patient continues to deny suicidal/self-harm/homicidal ideation, psychosis, paranoia.  Patient reports she is eating and sleeping without any difficulty, and continued to tolerate medications without any adverse reactions. During evaluation Nicole [name] is lying in bed with no noted distress.  She is alert, oriented x 4, calm, cooperative and attentive.  Her mood is euthymic with congruent affect.  She has normal speech, and behavior.  Objectively there is no evidence of psychosis/mania or delusional thinking.  Patient is able to converse coherently, goal directed thoughts, no distractibility, or pre-occupation.  She also denies suicidal/self-harm/homicidal ideation, psychosis, and paranoia.  Patient answered question appropriately.    Patient remains psychiatrically  cleared.  TOC in [facility] continues to seek placement.   Diagnosis:  Final diagnoses:  Severe episode of recurrent major depressive disorder, with psychotic features (HCC)    Total Time spent with patient: 30 minutes  Past Psychiatric History: SEE H&P Past Medical History:  Past Medical History:  Diagnosis Date   Anxiety    Depression    Hypothyroidism 08/07/2022   Tobacco use disorder 08/07/2022    Past Surgical History:  Procedure Laterality Date   WISDOM TOOTH EXTRACTION Bilateral 2020   Family History:  Family History  Problem Relation Age of Onset   Hypertension Father    Diabetes Father    Family Psychiatric  History: SEE H&P Social History:  Social History   Substance and Sexual Activity  Alcohol Use Never     Social History   Substance and Sexual Activity  Drug Use Never    Social History   Socioeconomic History   Marital status: Single    Spouse name: Not on file   Number of children: Not on file   Years of education: Not on file   Highest education level: Not on file  Occupational History   Not on file  Tobacco Use   Smoking status: Every Day    Types: Cigarettes   Smokeless tobacco: Never  Vaping Use   Vaping Use: Some days  Substance and Sexual Activity   Alcohol use: Never   Drug use: Never   Sexual activity: Yes    Partners: Female    Birth control/protection: Implant  Other Topics Concern   Not on file  Social History Narrative   Not on file   Social Determinants of Health   Financial Resource Strain: Not on file  Food Insecurity: Not  on file  Transportation Needs: Not on file  Physical Activity: Not on file  Stress: Not on file  Social Connections: Not on file   SDOH:  SDOH Screenings   Depression (PHQ2-9): Low Risk  (06/10/2022)  Tobacco Use: High Risk (09/18/2022)   Additional Social History:    Pain Medications: SEE MAR Prescriptions: SEE MAR Over the Counter: SEE MAR History of alcohol / drug use?: Yes Longest  period of sobriety (when/how long): N/A Negative Consequences of Use:  (NONE) Withdrawal Symptoms: None Name of Substance 1: ETOH IN PAST--PT CURRENTLY DENIES 1 - Frequency: SOCIAL USE ETOH IN PAST 1 - Method of Aquiring: LEGAL 1- Route of Use: ORAL DRINK Name of Substance 2: NICOTINE-VAPE AND CIGARETTES 2 - Amount (size/oz): LESS THAN ONE PACK 2 - Frequency: DAILY 2 - Method of Aquiring: LEGAL 2 - Route of Substance Use: ORAL SMOKE                Sleep: Good  Appetite:  Good  Current Medications:  Current Facility-Administered Medications  Medication Dose Route Frequency Provider Last Rate Last Admin   acetaminophen (TYLENOL) tablet 650 mg  650 mg Oral Q6H PRN Onuoha, Chinwendu V, NP   650 mg at 09/16/22 1619   alum & mag hydroxide-simeth (MAALOX/MYLANTA) 200-200-20 MG/5ML suspension 30 mL  30 mL Oral Q4H PRN Onuoha, Chinwendu V, NP   30 mL at 07/27/22 2151   ARIPiprazole ER (ABILIFY MAINTENA) injection 400 mg  400 mg Intramuscular Q28 days Princess Bruins, DO   400 mg at 08/30/22 1338   docusate sodium (COLACE) capsule 100 mg  100 mg Oral Daily [name], MD   100 mg at 09/18/22 0919   hydrOXYzine (ATARAX) tablet 25 mg  25 mg Oral TID PRN Onuoha, Chinwendu V, NP   25 mg at 09/17/22 2121   ibuprofen (ADVIL) tablet 400 mg  400 mg Oral Q6H PRN Onuoha, Chinwendu V, NP   400 mg at 09/17/22 0905   levothyroxine (SYNTHROID) tablet 100 mcg  100 mcg Oral Q0600 Princess Bruins, DO   100 mcg at 09/18/22 8469   metFORMIN (GLUCOPHAGE) tablet 500 mg  500 mg Oral Q breakfast Park Pope, MD   500 mg at 09/18/22 0919   nicotine (NICODERM CQ - dosed in mg/24 hours) patch 14 mg  14 mg Transdermal Q0600 Onuoha, Chinwendu V, NP   14 mg at 09/18/22 0920   ondansetron (ZOFRAN-ODT) disintegrating tablet 4 mg  4 mg Oral Q8H PRN Sindy Guadeloupe, NP   4 mg at 09/14/22 2351   Oxcarbazepine (TRILEPTAL) tablet 300 mg  300 mg Oral BID Onuoha, Chinwendu V, NP   300 mg at 09/18/22 0919   pantoprazole  (PROTONIX) EC tablet 20 mg  20 mg Oral Daily Princess Bruins, DO   20 mg at 09/18/22 0919   QUEtiapine (SEROQUEL) tablet 400 mg  400 mg Oral BID Onuoha, Chinwendu V, NP   400 mg at 09/18/22 0919   sertraline (ZOLOFT) tablet 150 mg  150 mg Oral Daily Onuoha, Chinwendu V, NP   150 mg at 09/18/22 0919   traZODone (DESYREL) tablet 100 mg  100 mg Oral QHS Ardis Hughs, NP   100 mg at 09/17/22 2121   valACYclovir (VALTREX) tablet 500 mg  500 mg Oral Daily Princess Bruins, DO   500 mg at 09/18/22 0919   ziprasidone (GEODON) injection 20 mg  20 mg Intramuscular Q12H PRN Onuoha, Chinwendu V, NP   20 mg at 09/13/22 2309  Current Outpatient Medications  Medication Sig Dispense Refill   ABILIFY MAINTENA 400 MG PRSY prefilled syringe 400 mg every 28 (twenty-eight) days.     cetirizine (ZYRTEC) 10 MG tablet Take 10 mg by mouth daily.     cyclobenzaprine (FLEXERIL) 10 MG tablet Take 1 tablet (10 mg total) by mouth 2 (two) times daily as needed for muscle spasms. 20 tablet 0   fluticasone (FLONASE) 50 MCG/ACT nasal spray Place 1 spray into both nostrils daily.     meloxicam (MOBIC) 15 MG tablet Take 15 mg by mouth daily.     Oxcarbazepine (TRILEPTAL) 300 MG tablet Take 1 tablet (300 mg total) by mouth 2 (two) times daily. 60 tablet 0   QUEtiapine (SEROQUEL) 400 MG tablet Take 1 tablet (400 mg total) by mouth 2 (two) times daily. 60 tablet 0   sertraline (ZOLOFT) 50 MG tablet Take 3 tablets (150 mg total) by mouth in the morning. 90 tablet 0   traZODone (DESYREL) 100 MG tablet Take 1 tablet (100 mg total) by mouth at bedtime. 30 tablet 0   valACYclovir (VALTREX) 500 MG tablet Take 500 mg by mouth daily.      Labs  Lab Results:  Admission on 07/25/2022  Component Date Value Ref Range Status   SARS Coronavirus 2 by RT PCR 07/25/2022 NEGATIVE  NEGATIVE Final   Comment: (NOTE) SARS-CoV-2 target nucleic acids are NOT DETECTED.  The SARS-CoV-2 RNA is generally detectable in upper respiratory specimens  during the acute phase of infection. The lowest concentration of SARS-CoV-2 viral copies this assay can detect is 138 copies/mL. A negative result does not preclude SARS-Cov-2 infection and should not be used as the sole basis for treatment or other patient management decisions. A negative result may occur with  improper specimen collection/handling, submission of specimen other than nasopharyngeal swab, presence of viral mutation(s) within the areas targeted by this assay, and inadequate number of viral copies(<138 copies/mL). A negative result must be combined with clinical observations, patient history, and epidemiological information. The expected result is Negative.  Fact Sheet for Patients:  BloggerCourse.comhttps://www.fda.gov/media/152166/download  Fact Sheet for Healthcare Providers:  SeriousBroker.ithttps://www.fda.gov/media/152162/download  This test is no                          t yet approved or cleared by the [place] FDA and  has been authorized for detection and/or diagnosis of SARS-CoV-2 by FDA under an Emergency Use Authorization (EUA). This EUA will remain  in effect (meaning this test can be used) for the duration of the COVID-19 declaration under Section 564(b)(1) of the Act, 21 U.S.C.section 360bbb-3(b)(1), unless the authorization is terminated  or revoked sooner.       Influenza A by PCR 07/25/2022 NEGATIVE  NEGATIVE Final   Influenza B by PCR 07/25/2022 NEGATIVE  NEGATIVE Final   Comment: (NOTE) The Xpert Xpress SARS-CoV-2/FLU/RSV plus assay is intended as an aid in the diagnosis of influenza from Nasopharyngeal swab specimens and should not be used as a sole basis for treatment. Nasal washings and aspirates are unacceptable for Xpert Xpress SARS-CoV-2/FLU/RSV testing.  Fact Sheet for Patients: BloggerCourse.comhttps://www.fda.gov/media/152166/download  Fact Sheet for Healthcare Providers: SeriousBroker.ithttps://www.fda.gov/media/152162/download  This test is not yet approved or cleared by the [place] FDA and has been authorized for detection and/or diagnosis of SARS-CoV-2 by FDA under an Emergency Use Authorization (EUA). This EUA will remain in effect (meaning this test can be used) for the duration of the COVID-19 declaration  under Section 564(b)(1) of the Act, 21 U.S.C. section 360bbb-3(b)(1), unless the authorization is terminated or revoked.  Performed at [facility], [address], [place], [place] [address]    WBC 07/25/2022 8.3  4.0 - 10.5 K/uL Final   RBC 07/25/2022 4.44  3.87 - 5.11 MIL/uL Final   Hemoglobin 07/25/2022 13.7  12.0 - 15.0 g/dL Final   HCT 60/45/4098 40.2  36.0 - 46.0 % Final   MCV 07/25/2022 90.5  80.0 - 100.0 fL Final   MCH 07/25/2022 30.9  26.0 - 34.0 pg Final   MCHC 07/25/2022 34.1  30.0 - 36.0 g/dL Final   RDW 11/91/4782 12.2  11.5 - 15.5 % Final   Platelets 07/25/2022 248  150 - 400 K/uL Final   nRBC 07/25/2022 0.0  0.0 - 0.2 % Final   Neutrophils Relative % 07/25/2022 43  % Final   Neutro Abs 07/25/2022 3.6  1.7 - 7.7 K/uL Final   Lymphocytes Relative 07/25/2022 52  % Final   Lymphs Abs 07/25/2022 4.2 (H)  0.7 - 4.0 K/uL Final   Monocytes Relative 07/25/2022 5  % Final   Monocytes Absolute 07/25/2022 0.4  0.1 - 1.0 K/uL Final   Eosinophils Relative 07/25/2022 0  % Final   Eosinophils Absolute 07/25/2022 0.0  0.0 - 0.5 K/uL Final   Basophils Relative 07/25/2022 0  % Final   Basophils Absolute 07/25/2022 0.0  0.0 - 0.1 K/uL Final   Immature Granulocytes 07/25/2022 0  % Final   Abs Immature Granulocytes 07/25/2022 0.02  0.00 - 0.07 K/uL Final   Performed at [facility], [address] [place], [place] [address]   Sodium 07/25/2022 138  135 - 145 mmol/L Final   Potassium 07/25/2022 4.0  3.5 - 5.1 mmol/L Final   Chloride 07/25/2022 104  98 - 111 mmol/L Final   CO2 07/25/2022 29  22 - 32 mmol/L Final   Glucose, Bld 07/25/2022 83  70 - 99 mg/dL Final   Glucose reference range applies only to samples taken after fasting for at  least 8 hours.   BUN 07/25/2022 11  6 - 20 mg/dL Final   Creatinine, Ser 07/25/2022 0.97  0.44 - 1.00 mg/dL Final   Calcium 30/86/5784 9.2  8.9 - 10.3 mg/dL Final   Total Protein 69/62/9528 7.0  6.5 - 8.1 g/dL Final   Albumin 41/32/4401 3.8  3.5 - 5.0 g/dL Final   AST 02/72/5366 18  15 - 41 U/L Final   ALT 07/25/2022 22  0 - 44 U/L Final   Alkaline Phosphatase 07/25/2022 64  38 - 126 U/L Final   Total Bilirubin 07/25/2022 0.2 (L)  0.3 - 1.2 mg/dL Final   GFR, Estimated 07/25/2022 >60  >60 mL/min Final   Comment: (NOTE) Calculated using the CKD-EPI Creatinine Equation (2021)    Anion gap 07/25/2022 5  5 - 15 Final   Performed at [facility], [address] [place], [place] [address]   POC Amphetamine UR 07/25/2022 None Detected  NONE DETECTED (Cut Off Level 1000 ng/mL) Preliminary   POC Secobarbital (BAR) 07/25/2022 None Detected  NONE DETECTED (Cut Off Level 300 ng/mL) Preliminary   POC Buprenorphine (BUP) 07/25/2022 None Detected  NONE DETECTED (Cut Off Level 10 ng/mL) Preliminary   POC Oxazepam (BZO) 07/25/2022 None Detected  NONE DETECTED (Cut Off Level 300 ng/mL) Preliminary   POC Cocaine UR 07/25/2022 None Detected  NONE DETECTED (Cut Off Level 300 ng/mL) Preliminary   POC  Methamphetamine UR 07/25/2022 None Detected  NONE DETECTED (Cut Off Level 1000 ng/mL) Preliminary   POC Morphine 07/25/2022 None Detected  NONE DETECTED (Cut Off Level 300 ng/mL) Preliminary   POC Methadone UR 07/25/2022 None Detected  NONE DETECTED (Cut Off Level 300 ng/mL) Preliminary   POC Oxycodone UR 07/25/2022 None Detected  NONE DETECTED (Cut Off Level 100 ng/mL) Preliminary   POC Marijuana UR 07/25/2022 None Detected  NONE DETECTED (Cut Off Level 50 ng/mL) Preliminary   SARSCOV2ONAVIRUS 2 AG 07/25/2022 NEGATIVE  NEGATIVE Final   Comment: (NOTE) SARS-CoV-2 antigen NOT DETECTED.   Negative results are presumptive.  Negative results do not preclude SARS-CoV-2 infection and should not be used as the  sole basis for treatment or other patient management decisions, including infection  control decisions, particularly in the presence of clinical signs and  symptoms consistent with COVID-19, or in those who have been in contact with the virus.  Negative results must be combined with clinical observations, patient history, and epidemiological information. The expected result is Negative.  Fact Sheet for Patients: https://www.jennings-kim.com/  Fact Sheet for Healthcare Providers: https://alexander-rogers.biz/  This test is not yet approved or cleared by the [place] FDA and  has been authorized for detection and/or diagnosis of SARS-CoV-2 by FDA under an Emergency Use Authorization (EUA).  This EUA will remain in effect (meaning this test can be used) for the duration of  the COV                          ID-19 declaration under Section 564(b)(1) of the Act, 21 U.S.C. section 360bbb-3(b)(1), unless the authorization is terminated or revoked sooner.     Cholesterol 07/25/2022 181  0 - 200 mg/dL Final   Triglycerides 16/07/9603 118  <150 mg/dL Final   HDL 54/06/8118 45  >40 mg/dL Final   Total CHOL/HDL Ratio 07/25/2022 4.0  RATIO Final   VLDL 07/25/2022 24  0 - 40 mg/dL Final   LDL Cholesterol 07/25/2022 112 (H)  0 - 99 mg/dL Final   Comment:        Total Cholesterol/HDL:CHD Risk Coronary Heart Disease Risk Table                     Men   Women  1/2 Average Risk   3.4   3.3  Average Risk       5.0   4.4  2 X Average Risk   9.6   7.1  3 X Average Risk  23.4   11.0        Use the calculated Patient Ratio above and the CHD Risk Table to determine the patient's CHD Risk.        ATP III CLASSIFICATION (LDL):  <100     mg/dL   Optimal  147-829  mg/dL   Near or Above                    Optimal  130-159  mg/dL   Borderline  562-130  mg/dL   High  >865     mg/dL   Very High Performed at [facility], [address] [place], [place] [address]     TSH 07/25/2022 6.668 (H)  0.350 - 4.500 uIU/mL Final   Comment: Performed by a 3rd Generation assay with a functional sensitivity of <=0.01 uIU/mL. Performed at [facility], [address], [place], [place] [address]    Glucose-Capillary 07/26/2022  104 (H)  70 - 99 mg/dL Final   Glucose reference range applies only to samples taken after fasting for at least 8 hours.   T3, Free 07/31/2022 2.3  2.0 - 4.4 pg/mL Final   Comment: (NOTE) Performed At: [facility] [address] [place], [place] [id number] Jolene Schimke MD UV:[id number]    Free T4 07/31/2022 0.60 (L)  0.61 - 1.12 ng/dL Final   Comment: (NOTE) Biotin ingestion may interfere with free T4 tests. If the results are inconsistent with the TSH level, previous test results, or the clinical presentation, then consider biotin interference. If needed, order repeat testing after stopping biotin. Performed at [facility], [address] [place], [place] [address]    Glucose-Capillary 08/29/2022 100 (H)  70 - 99 mg/dL Final   Glucose reference range applies only to samples taken after fasting for at least 8 hours.   TSH 09/05/2022 0.793  0.350 - 4.500 uIU/mL Final   Comment: Performed by a 3rd Generation assay with a functional sensitivity of <=0.01 uIU/mL. Performed at [facility], [address], [place], [place] [address]    Free T4 09/05/2022 0.73  0.61 - 1.12 ng/dL Final   Comment: (NOTE) Biotin ingestion may interfere with free T4 tests. If the results are inconsistent with the TSH level, previous test results, or the clinical presentation, then consider biotin interference. If needed, order repeat testing after stopping biotin. Performed at [facility], [address], [place], [place] [address]    Preg Test, Ur 09/06/2022 Negative  Negative Final   Preg Test, Ur 09/06/2022 NEGATIVE  NEGATIVE Final   Comment:        THE SENSITIVITY OF THIS METHODOLOGY IS >24 mIU/mL    Preg Test,  Ur 09/05/2022 NEGATIVE  NEGATIVE Final   Comment:        THE SENSITIVITY OF THIS METHODOLOGY IS >24 mIU/mL    Sodium 09/13/2022 136  135 - 145 mmol/L Final   Potassium 09/13/2022 4.5  3.5 - 5.1 mmol/L Final   Chloride 09/13/2022 105  98 - 111 mmol/L Final   CO2 09/13/2022 21 (L)  22 - 32 mmol/L Final   Glucose, Bld 09/13/2022 111 (H)  70 - 99 mg/dL Final   Glucose reference range applies only to samples taken after fasting for at least 8 hours.   BUN 09/13/2022 14  6 - 20 mg/dL Final   Creatinine, Ser 09/13/2022 0.92  0.44 - 1.00 mg/dL Final   Calcium 29/51/8841 9.1  8.9 - 10.3 mg/dL Final   GFR, Estimated 09/13/2022 >60  >60 mL/min Final   Comment: (NOTE) Calculated using the CKD-EPI Creatinine Equation (2021)    Anion gap 09/13/2022 10  5 - 15 Final   Performed at [facility], [address] [place], [place] [address]   Vitamin B-12 09/13/2022 585  180 - 914 pg/mL Final   Comment: (NOTE) This assay is not validated for testing neonatal or myeloproliferative syndrome specimens for Vitamin B12 levels. Performed at [facility], [address], [place], [place] [address]    Color, Urine 09/14/2022 YELLOW  YELLOW Final   APPearance 09/14/2022 HAZY (A)  CLEAR Final   Specific Gravity, Urine 09/14/2022 1.025  1.005 - 1.030 Final   pH 09/14/2022 5.0  5.0 - 8.0 Final   Glucose, UA 09/14/2022 NEGATIVE  NEGATIVE mg/dL Final   Hgb urine dipstick 09/14/2022 NEGATIVE  NEGATIVE Final   Bilirubin Urine 09/14/2022 NEGATIVE  NEGATIVE  Final   Ketones, ur 09/14/2022 NEGATIVE  NEGATIVE mg/dL Final   Protein, ur 21/30/8657 NEGATIVE  NEGATIVE mg/dL Final   Nitrite 84/69/6295 NEGATIVE  NEGATIVE Final   Leukocytes,Ua 09/14/2022 TRACE (A)  NEGATIVE Final   RBC / HPF 09/14/2022 0-5  0 - 5 RBC/hpf Final   WBC, UA 09/14/2022 0-5  0 - 5 WBC/hpf Final   Bacteria, UA 09/14/2022 RARE (A)  NONE SEEN Final   Squamous Epithelial / LPF 09/14/2022 0-5  0 - 5 Final   Mucus 09/14/2022 PRESENT    Final   Performed at [facility], [address] [place], [place] [address]   Glucose-Capillary 09/16/2022 105 (H)  70 - 99 mg/dL Final   Glucose reference range applies only to samples taken after fasting for at least 8 hours.  Admission on 07/04/2022, Discharged on 07/05/2022  Component Date Value Ref Range Status   Sodium 07/04/2022 142  135 - 145 mmol/L Final   Potassium 07/04/2022 4.4  3.5 - 5.1 mmol/L Final   Chloride 07/04/2022 109  98 - 111 mmol/L Final   CO2 07/04/2022 26  22 - 32 mmol/L Final   Glucose, Bld 07/04/2022 96  70 - 99 mg/dL Final   Glucose reference range applies only to samples taken after fasting for at least 8 hours.   BUN 07/04/2022 15  6 - 20 mg/dL Final   Creatinine, Ser 07/04/2022 0.83  0.44 - 1.00 mg/dL Final   Calcium 24/40/1027 9.3  8.9 - 10.3 mg/dL Final   Total Protein 25/36/6440 7.2  6.5 - 8.1 g/dL Final   Albumin 34/74/2595 3.9  3.5 - 5.0 g/dL Final   AST 63/87/5643 23  15 - 41 U/L Final   ALT 07/04/2022 28  0 - 44 U/L Final   Alkaline Phosphatase 07/04/2022 77  38 - 126 U/L Final   Total Bilirubin 07/04/2022 0.4  0.3 - 1.2 mg/dL Final   GFR, Estimated 07/04/2022 >60  >60 mL/min Final   Comment: (NOTE) Calculated using the CKD-EPI Creatinine Equation (2021)    Anion gap 07/04/2022 7  5 - 15 Final   Performed at [facility], [address] [place], [place] [address]   Alcohol, Ethyl (B) 07/04/2022 <10  <10 mg/dL Final   Comment: (NOTE) Lowest detectable limit for serum alcohol is 10 mg/dL.  For medical purposes only. Performed at [facility], [address] [place], [place] [address]    WBC 07/04/2022 7.0  4.0 - 10.5 K/uL Final   RBC 07/04/2022 4.18  3.87 - 5.11 MIL/uL Final   Hemoglobin 07/04/2022 12.8  12.0 - 15.0 g/dL Final   HCT 60/63/0160 39.1  36.0 - 46.0 % Final   MCV 07/04/2022 93.5  80.0 - 100.0 fL Final   MCH 07/04/2022 30.6  26.0 - 34.0 pg Final   MCHC 07/04/2022 32.7  30.0 - 36.0  g/dL Final   RDW 10/93/2355 12.9  11.5 - 15.5 % Final   Platelets 07/04/2022 243  150 - 400 K/uL Final   nRBC 07/04/2022 0.0  0.0 - 0.2 % Final   Neutrophils Relative % 07/04/2022 43  % Final   Neutro Abs 07/04/2022 3.0  1.7 - 7.7 K/uL Final   Lymphocytes Relative 07/04/2022 50  % Final   Lymphs Abs 07/04/2022 3.5  0.7 - 4.0 K/uL Final   Monocytes Relative 07/04/2022 7  % Final   Monocytes Absolute 07/04/2022 0.5  0.1 - 1.0 K/uL Final   Eosinophils Relative  07/04/2022 0  % Final   Eosinophils Absolute 07/04/2022 0.0  0.0 - 0.5 K/uL Final   Basophils Relative 07/04/2022 0  % Final   Basophils Absolute 07/04/2022 0.0  0.0 - 0.1 K/uL Final   Immature Granulocytes 07/04/2022 0  % Final   Abs Immature Granulocytes 07/04/2022 0.01  0.00 - 0.07 K/uL Final   Performed at [facility], [address], [place], [place] [address]   I-stat hCG, quantitative 07/04/2022 <5.0  <5 mIU/mL Final   Comment 3 07/04/2022          Final   Comment:   GEST. AGE      CONC.  (mIU/mL)   <=1 WEEK        5 - 50     2 WEEKS       50 - 500     3 WEEKS       100 - 10,000     4 WEEKS     1,000 - 30,000        FEMALE AND NON-PREGNANT FEMALE:     LESS THAN 5 mIU/mL   Admission on 06/14/2022, Discharged on 06/14/2022  Component Date Value Ref Range Status   Color, UA 06/14/2022 yellow  yellow Final   Clarity, UA 06/14/2022 cloudy (A)  clear Final   Glucose, UA 06/14/2022 negative  negative mg/dL Final   Bilirubin, UA 62/95/2841 negative  negative Final   Ketones, POC UA 06/14/2022 negative  negative mg/dL Final   Spec Grav, UA 32/44/0102 1.025  1.010 - 1.025 Final   Blood, UA 06/14/2022 negative  negative Final   pH, UA 06/14/2022 7.5  5.0 - 8.0 Final   Protein Ur, POC 06/14/2022 =30 (A)  negative mg/dL Final   Urobilinogen, UA 06/14/2022 0.2  0.2 or 1.0 E.U./dL Final   Nitrite, UA 72/53/6644 Negative  Negative Final   Leukocytes, UA 06/14/2022 Negative  Negative Final   Preg Test, Ur  06/14/2022 Negative  Negative Final   Specimen Description 06/14/2022 URINE, CLEAN CATCH   Final   Special Requests 06/14/2022    Final                   Value:NONE Performed at [facility], [address] [place], [place] [address]    Culture 06/14/2022 MULTIPLE SPECIES PRESENT, SUGGEST RECOLLECTION (A)   Final   Report Status 06/14/2022 06/16/2022 FINAL   Final  Admission on 06/07/2022, Discharged on 06/10/2022  Component Date Value Ref Range Status   SARS Coronavirus 2 by RT PCR 06/07/2022 NEGATIVE  NEGATIVE Final   Comment: (NOTE) SARS-CoV-2 target nucleic acids are NOT DETECTED.  The SARS-CoV-2 RNA is generally detectable in upper respiratory specimens during the acute phase of infection. The lowest concentration of SARS-CoV-2 viral copies this assay can detect is 138 copies/mL. A negative result does not preclude SARS-Cov-2 infection and should not be used as the sole basis for treatment or other patient management decisions. A negative result may occur with  improper specimen collection/handling, submission of specimen other than nasopharyngeal swab, presence of viral mutation(s) within the areas targeted by this assay, and inadequate number of viral copies(<138 copies/mL). A negative result must be combined with clinical observations, patient history, and epidemiological information. The expected result is Negative.  Fact Sheet for Patients:  BloggerCourse.com  Fact Sheet for Healthcare Providers:  SeriousBroker.it  This test is no  t yet approved or cleared by the [place] and  has been authorized for detection and/or diagnosis of SARS-CoV-2 by FDA under an Emergency Use Authorization (EUA). This EUA will remain  in effect (meaning this test can be used) for the duration of the COVID-19 declaration under Section 564(b)(1) of the Act, 21 U.S.C.section 360bbb-3(b)(1), unless the  authorization is terminated  or revoked sooner.       Influenza A by PCR 06/07/2022 NEGATIVE  NEGATIVE Final   Influenza B by PCR 06/07/2022 NEGATIVE  NEGATIVE Final   Comment: (NOTE) The Xpert Xpress SARS-CoV-2/FLU/RSV plus assay is intended as an aid in the diagnosis of influenza from Nasopharyngeal swab specimens and should not be used as a sole basis for treatment. Nasal washings and aspirates are unacceptable for Xpert Xpress SARS-CoV-2/FLU/RSV testing.  Fact Sheet for Patients: BloggerCourse.com  Fact Sheet for Healthcare Providers: SeriousBroker.it  This test is not yet approved or cleared by the [place] FDA and has been authorized for detection and/or diagnosis of SARS-CoV-2 by FDA under an Emergency Use Authorization (EUA). This EUA will remain in effect (meaning this test can be used) for the duration of the COVID-19 declaration under Section 564(b)(1) of the Act, 21 U.S.C. section 360bbb-3(b)(1), unless the authorization is terminated or revoked.  Performed at [facility], [address], [place], [place] [address]    WBC 06/07/2022 5.7  4.0 - 10.5 K/uL Final   RBC 06/07/2022 4.39  3.87 - 5.11 MIL/uL Final   Hemoglobin 06/07/2022 13.2  12.0 - 15.0 g/dL Final   HCT 60/45/4098 40.4  36.0 - 46.0 % Final   MCV 06/07/2022 92.0  80.0 - 100.0 fL Final   MCH 06/07/2022 30.1  26.0 - 34.0 pg Final   MCHC 06/07/2022 32.7  30.0 - 36.0 g/dL Final   RDW 11/91/4782 12.4  11.5 - 15.5 % Final   Platelets 06/07/2022 308  150 - 400 K/uL Final   nRBC 06/07/2022 0.0  0.0 - 0.2 % Final   Neutrophils Relative % 06/07/2022 42  % Final   Neutro Abs 06/07/2022 2.4  1.7 - 7.7 K/uL Final   Lymphocytes Relative 06/07/2022 54  % Final   Lymphs Abs 06/07/2022 3.1  0.7 - 4.0 K/uL Final   Monocytes Relative 06/07/2022 4  % Final   Monocytes Absolute 06/07/2022 0.2  0.1 - 1.0 K/uL Final   Eosinophils Relative 06/07/2022 0  % Final    Eosinophils Absolute 06/07/2022 0.0  0.0 - 0.5 K/uL Final   Basophils Relative 06/07/2022 0  % Final   Basophils Absolute 06/07/2022 0.0  0.0 - 0.1 K/uL Final   Immature Granulocytes 06/07/2022 0  % Final   Abs Immature Granulocytes 06/07/2022 0.01  0.00 - 0.07 K/uL Final   Performed at [facility], [address] [place], [place] [address]   Sodium 06/07/2022 139  135 - 145 mmol/L Final   Potassium 06/07/2022 4.0  3.5 - 5.1 mmol/L Final   Chloride 06/07/2022 104  98 - 111 mmol/L Final   CO2 06/07/2022 28  22 - 32 mmol/L Final   Glucose, Bld 06/07/2022 104 (H)  70 - 99 mg/dL Final   Glucose reference range applies only to samples taken after fasting for at least 8 hours.   BUN 06/07/2022 8  6 - 20 mg/dL Final   Creatinine, Ser 06/07/2022 0.84  0.44 - 1.00 mg/dL Final   Calcium 30/86/5784 9.1  8.9 - 10.3 mg/dL Final  Total Protein 06/07/2022 6.9  6.5 - 8.1 g/dL Final   Albumin 16/07/9603 3.7  3.5 - 5.0 g/dL Final   AST 54/06/8118 19  15 - 41 U/L Final   ALT 06/07/2022 22  0 - 44 U/L Final   Alkaline Phosphatase 06/07/2022 54  38 - 126 U/L Final   Total Bilirubin 06/07/2022 0.4  0.3 - 1.2 mg/dL Final   GFR, Estimated 06/07/2022 >60  >60 mL/min Final   Comment: (NOTE) Calculated using the CKD-EPI Creatinine Equation (2021)    Anion gap 06/07/2022 7  5 - 15 Final   Performed at [facility], [address], [place], [place] [address]   Hgb A1c MFr Bld 06/07/2022 5.0  4.8 - 5.6 % Final   Comment: (NOTE) Pre diabetes:          5.7%-6.4%  Diabetes:              >6.4%  Glycemic control for   <7.0% adults with diabetes    Mean Plasma Glucose 06/07/2022 96.8  mg/dL Final   Performed at [facility], [address], [place], [place] [address]   TSH 06/07/2022 1.620  0.350 - 4.500 uIU/mL Final   Comment: Performed by a 3rd Generation assay with a functional sensitivity of <=0.01 uIU/mL. Performed at [facility], [address], [place], [place] [address]     RPR Ser Ql 06/07/2022 NON REACTIVE  NON REACTIVE Final   Performed at [facility], [address] [place], [place] [address]   Color, Urine 06/07/2022 YELLOW  YELLOW Final   APPearance 06/07/2022 HAZY (A)  CLEAR Final   Specific Gravity, Urine 06/07/2022 1.018  1.005 - 1.030 Final   pH 06/07/2022 7.0  5.0 - 8.0 Final   Glucose, UA 06/07/2022 NEGATIVE  NEGATIVE mg/dL Final   Hgb urine dipstick 06/07/2022 NEGATIVE  NEGATIVE Final   Bilirubin Urine 06/07/2022 NEGATIVE  NEGATIVE Final   Ketones, ur 06/07/2022 NEGATIVE  NEGATIVE mg/dL Final   Protein, ur 62/95/2841 NEGATIVE  NEGATIVE mg/dL Final   Nitrite 32/44/0102 NEGATIVE  NEGATIVE Final   Leukocytes,Ua 06/07/2022 NEGATIVE  NEGATIVE Final   Performed at [facility], [address], [place], [place] [address]   Cholesterol 06/07/2022 164  0 - 200 mg/dL Final   Triglycerides 64/40/3474 158 (H)  <150 mg/dL Final   HDL 25/95/6387 44  >40 mg/dL Final   Total CHOL/HDL Ratio 06/07/2022 3.7  RATIO Final   VLDL 06/07/2022 32  0 - 40 mg/dL Final   LDL Cholesterol 06/07/2022 88  0 - 99 mg/dL Final   Comment:        Total Cholesterol/HDL:CHD Risk Coronary Heart Disease Risk Table                     Men   Women  1/2 Average Risk   3.4   3.3  Average Risk       5.0   4.4  2 X Average Risk   9.6   7.1  3 X Average Risk  23.4   11.0        Use the calculated Patient Ratio above and the CHD Risk Table to determine the patient's CHD Risk.        ATP III CLASSIFICATION (LDL):  <100     mg/dL   Optimal  564-332  mg/dL   Near or Above  Optimal  130-159  mg/dL   Borderline  976-734  mg/dL   High  >193     mg/dL   Very High Performed at [facility], [address], [place], [place] [address]    HIV Screen 4th Generation wRfx 06/07/2022 Non Reactive  Non Reactive Final   Performed at [facility], [address], [place], [place] [address]   SARSCOV2ONAVIRUS 2 AG 06/07/2022 NEGATIVE  NEGATIVE Final    Comment: (NOTE) SARS-CoV-2 antigen NOT DETECTED.   Negative results are presumptive.  Negative results do not preclude SARS-CoV-2 infection and should not be used as the sole basis for treatment or other patient management decisions, including infection  control decisions, particularly in the presence of clinical signs and  symptoms consistent with COVID-19, or in those who have been in contact with the virus.  Negative results must be combined with clinical observations, patient history, and epidemiological information. The expected result is Negative.  Fact Sheet for Patients: https://www.jennings-kim.com/  Fact Sheet for Healthcare Providers: https://alexander-rogers.biz/  This test is not yet approved or cleared by the [place] FDA and  has been authorized for detection and/or diagnosis of SARS-CoV-2 by FDA under an Emergency Use Authorization (EUA).  This EUA will remain in effect (meaning this test can be used) for the duration of  the COV                          ID-19 declaration under Section 564(b)(1) of the Act, 21 U.S.C. section 360bbb-3(b)(1), unless the authorization is terminated or revoked sooner.     POC Amphetamine UR 06/07/2022 None Detected  NONE DETECTED (Cut Off Level 1000 ng/mL) Final   POC Secobarbital (BAR) 06/07/2022 None Detected  NONE DETECTED (Cut Off Level 300 ng/mL) Final   POC Buprenorphine (BUP) 06/07/2022 None Detected  NONE DETECTED (Cut Off Level 10 ng/mL) Final   POC Oxazepam (BZO) 06/07/2022 None Detected  NONE DETECTED (Cut Off Level 300 ng/mL) Final   POC Cocaine UR 06/07/2022 None Detected  NONE DETECTED (Cut Off Level 300 ng/mL) Final   POC Methamphetamine UR 06/07/2022 None Detected  NONE DETECTED (Cut Off Level 1000 ng/mL) Final   POC Morphine 06/07/2022 None Detected  NONE DETECTED (Cut Off Level 300 ng/mL) Final   POC Methadone UR 06/07/2022 None Detected  NONE DETECTED (Cut Off Level 300 ng/mL) Final    POC Oxycodone UR 06/07/2022 None Detected  NONE DETECTED (Cut Off Level 100 ng/mL) Final   POC Marijuana UR 06/07/2022 None Detected  NONE DETECTED (Cut Off Level 50 ng/mL) Final   Preg Test, Ur 06/08/2022 NEGATIVE  NEGATIVE Final   Comment:        THE SENSITIVITY OF THIS METHODOLOGY IS >20 mIU/mL. Performed at [facility], [address] [place], [place] [address]    Preg Test, Ur 06/08/2022 NEGATIVE  NEGATIVE Final   Comment:        THE SENSITIVITY OF THIS METHODOLOGY IS >24 mIU/mL     Blood Alcohol level:  Lab Results  Component Value Date   ETH <10 07/04/2022   ETH <10 02/07/2022    Metabolic Disorder Labs: Lab Results  Component Value Date   HGBA1C 5.0 06/07/2022   MPG 96.8 06/07/2022   MPG 96.8 02/07/2022   No results found for: "PROLACTIN" Lab Results  Component Value Date   CHOL 181 07/25/2022   TRIG 118 07/25/2022   HDL 45 07/25/2022  CHOLHDL 4.0 07/25/2022   VLDL 24 07/25/2022   LDLCALC 112 (H) 07/25/2022   LDLCALC 88 06/07/2022    Therapeutic Lab Levels: No results found for: "LITHIUM" No results found for: "VALPROATE" No results found for: "CBMZ"  Physical Findings   GAD-7    Flowsheet Row [facility]17/2021 [facility]  Total GAD-7 Score 17      PHQ2-9    Flowsheet Row [facility] Office Visit from 12/17/2021 in [facility]  PHQ-2 Total Score 1 2  PHQ-9 Total Score 2 8      Flowsheet Row ED from 07/25/2022 in [facility] ED from 07/04/2022 in [facility] from 06/14/2022 in [facility]   C-SSRS RISK CATEGORY High Risk No Risk No Risk        Musculoskeletal  Strength & Muscle Tone: within normal limits Gait & Station: normal Patient leans: N/A  Psychiatric Specialty Exam  Presentation  General Appearance:  Appropriate  for Environment  Eye Contact: Good  Speech: Clear and Coherent; Normal Rate  Speech Volume: Normal  Handedness: Right   Mood and Affect  Mood: Euthymic  Affect: Appropriate; Congruent   Thought Process  Thought Processes: Coherent; Goal Directed  Descriptions of Associations:Intact  Orientation:Full (Time, Place and Person)  Thought Content:Logical  Diagnosis of Schizophrenia or Schizoaffective disorder in past: Yes  Duration of Psychotic Symptoms: Greater than six months   Hallucinations:Hallucinations: None  Ideas of Reference:None  Suicidal Thoughts:Suicidal Thoughts: No  Homicidal Thoughts:Homicidal Thoughts: No   Sensorium  Memory: Immediate Good; Recent Good  Judgment: Intact  Insight: Present   Executive Functions  Concentration: Good  Attention Span: Good  Recall: Good  Fund of Knowledge: Good  Language: Good   Psychomotor Activity  Psychomotor Activity: Psychomotor Activity: Normal   Assets  Assets: Communication Skills; Desire for Improvement; Financial Resources/Insurance   Sleep  Sleep: Sleep: Good   Nutritional Assessment (For OBS and [facility] admissions only) Has the patient had a weight loss or gain of 10 pounds or more in the last 3 months?: No Has the patient had a decrease in food intake/or appetite?: No Does the patient have dental problems?: No Does the patient have eating habits or behaviors that may be indicators of an eating disorder including binging or inducing vomiting?: No Has the patient recently lost weight without trying?: 0 Has the patient been eating poorly because of a decreased appetite?: 0 Malnutrition Screening Tool Score: 0   Physical Exam  Physical Exam Vitals and nursing note reviewed.  Constitutional:      General: She is not in acute distress.    Appearance: Normal appearance. She is not ill-appearing.  HENT:     Head: Normocephalic.  Eyes:     General:        Right eye: No  discharge.        Left eye: No discharge.     Conjunctiva/sclera: Conjunctivae normal.  Cardiovascular:     Rate and Rhythm: Normal rate.  Pulmonary:     Effort: Pulmonary effort is normal.  Musculoskeletal:        General: Normal range of motion.     Cervical back: Normal range of motion.  Skin:    Coloration: Skin is not jaundiced or pale.  Neurological:     Mental Status: She is alert and oriented to person,  place, and time.  Psychiatric:        Attention and Perception: Attention and perception normal.        Mood and Affect: Affect normal.        Speech: Speech normal.        Behavior: Behavior normal. Behavior is cooperative.        Thought Content: Thought content normal.        Cognition and Memory: Cognition normal.        Judgment: Judgment normal.    Review of Systems  Constitutional: Negative.   HENT: Negative.    Eyes: Negative.   Respiratory: Negative.    Cardiovascular: Negative.   Gastrointestinal:  Positive for constipation. Negative for abdominal pain, nausea and vomiting.  Musculoskeletal: Negative.   Skin: Negative.   Neurological: Negative.   Psychiatric/Behavioral: Negative.  Negative for depression, hallucinations, substance abuse and suicidal ideas. The patient is not nervous/anxious and does not have insomnia.    Blood pressure 108/69, pulse (!) 113, temperature 98.2 F (36.8 C), temperature source Tympanic, resp. rate 20, SpO2 98 %. There is no height or weight on file to calculate BMI.  Miralax added to medication regimen for constipation  Rachid Parham, NP 09/18/2022 11:07 AM

## 2022-09-18 NOTE — ED Notes (Signed)
[name]&o x3. Denies SI/HI/AVH,..Denies intent or plan  to harm self or others. Routine conducted according to faculty protocol .Encourge patient to notify  staff with any needs or concerns. Patient verbalized agreement and understanding.Will continue to monitor for safety. 

## 2022-09-18 NOTE — ED Notes (Signed)
[name]&o x3. Denies SI/HI/AVH,..Denies intent or plan  to harm self or others. Routine conducted according to faculty protocol .Encourge patient to notify  staff with any needs or concerns. Patient verbalized agreement and understanding.Will continue to monitor for safety.

## 2022-09-19 NOTE — ED Notes (Signed)
Pt sleeping at present, no distress noted.  Monitoring for safety. 

## 2022-09-19 NOTE — ED Notes (Signed)
Snack given.

## 2022-09-19 NOTE — ED Provider Notes (Signed)
Behavioral Health Progress Note  Date and Time: 09/19/2022 1:06 PM Name: Nicole [name] MRN:  [id number]   Per HPI: Nicole [name] is a 31 year old female with a past psychiatric history significant for schizophrenia and major depressive disorder with psychosis who voluntarily presented to [facility] on 07/25/2022 via [facility] after a verbal altercation with Nicole [name] (not patient's legal guardian) for transient SI. This is patient's fourth visit to [facility] for similar concerns this year. Patient has been dismissed from previous group home due to threatening suicidal and homicidal ideations, then leaving the group home. Legal Guardian: Mom Nicole [name]) transitioning to be Dad Nicole [name]) Point of contact: Dad Nicole [name]).   Subjective: Patient seen and evaluated face-to-face by this provider, and chart reviewed. On evaluation, patient is alert and oriented x 4.  Her thought process is linear. Her speech is clear and coherent at a moderate tone. Her mood is euthymic and affect is congruent. She has fair eye contact. She is calm and cooperative. She denies SI/HI/AVH. There is no objective evidence that the patient is currently responding to internal or external stimuli. She is compliant with taking scheduled medications and denies medication side effects at this time. She denies physical complaints. She states that she has been calling Nicole [name] to get her medical records of fetal alcohol syndrome. She states that Nicole [name] with Nicole [name] is requesting her medical records. She states that Nicole [name] told her to access her mychart for her records. I informed the patient that I would consult with the [name] to assist her with obtaining her medical records.   Diagnosis:  Final diagnoses:  Severe episode of recurrent major depressive disorder, with psychotic features (HCC)    Total Time spent with patient: 15 minutes  Past Psychiatric History: History of schizoaffective, borderline IDD, anxiety, and MDD.   Past Medical History:  Past Medical History:  Diagnosis Date   Anxiety    Depression    Hypothyroidism 08/07/2022   Tobacco use disorder 08/07/2022    Past Surgical History:  Procedure Laterality Date   WISDOM TOOTH EXTRACTION Bilateral 2020   Family History:  Family History  Problem Relation Age of Onset   Hypertension Father    Diabetes Father    Family Psychiatric  History: No history reported Social History:  Social History   Substance and Sexual Activity  Alcohol Use Never     Social History   Substance and Sexual Activity  Drug Use Never    Social History   Socioeconomic History   Marital status: Single    Spouse name: Not on file   Number of children: Not on file   Years of education: Not on file   Highest education level: Not on file  Occupational History   Not on file  Tobacco Use   Smoking status: Every Day    Types: Cigarettes   Smokeless tobacco: Never  Vaping Use   Vaping Use: Some days  Substance and Sexual Activity   Alcohol use: Never   Drug use: Never   Sexual activity: Yes    Partners: Female    Birth control/protection: Implant  Other Topics Concern   Not on file  Social History Narrative   Not on file   Social Determinants of Health   Financial Resource Strain: Not on file  Food Insecurity: Not on file  Transportation Needs: Not on file  Physical Activity: Not on file  Stress: Not on file  Social Connections: Not on file   SDOH:  SDOH  Screenings   Depression (PHQ2-9): Low Risk  (06/10/2022)  Tobacco Use: High Risk (09/18/2022)   Additional Social History:    Pain Medications: SEE MAR Prescriptions: SEE MAR Over the Counter: SEE MAR History of alcohol / drug use?: Yes Longest period of sobriety (when/how long): N/A Negative Consequences of Use:  (NONE) Withdrawal Symptoms: None Name of Substance 1: ETOH IN PAST--PT CURRENTLY DENIES 1 - Frequency: SOCIAL USE ETOH IN PAST 1 - Method of Aquiring: LEGAL 1- Route of  Use: ORAL DRINK Name of Substance 2: NICOTINE-VAPE AND CIGARETTES 2 - Amount (size/oz): LESS THAN ONE PACK 2 - Frequency: DAILY 2 - Method of Aquiring: LEGAL 2 - Route of Substance Use: ORAL SMOKE    Current Medications:  Current Facility-Administered Medications  Medication Dose Route Frequency Provider Last Rate Last Admin   acetaminophen (TYLENOL) tablet 650 mg  650 mg Oral Q6H PRN Onuoha, Chinwendu V, NP   650 mg at 09/16/22 1619   alum & mag hydroxide-simeth (MAALOX/MYLANTA) 200-200-20 MG/5ML suspension 30 mL  30 mL Oral Q4H PRN Onuoha, Chinwendu V, NP   30 mL at 07/27/22 2151   ARIPiprazole ER (ABILIFY MAINTENA) injection 400 mg  400 mg Intramuscular Q28 days Princess Bruins, DO   400 mg at 08/30/22 1338   docusate sodium (COLACE) capsule 100 mg  100 mg Oral Daily [name], MD   100 mg at 09/19/22 0927   hydrOXYzine (ATARAX) tablet 25 mg  25 mg Oral TID PRN Onuoha, Chinwendu V, NP   25 mg at 09/18/22 2137   ibuprofen (ADVIL) tablet 400 mg  400 mg Oral Q6H PRN Onuoha, Chinwendu V, NP   400 mg at 09/17/22 0905   levothyroxine (SYNTHROID) tablet 100 mcg  100 mcg Oral Q0600 Princess Bruins, DO   100 mcg at 09/19/22 0620   loratadine (CLARITIN) tablet 10 mg  10 mg Oral Daily Rankin, Shuvon B, NP   10 mg at 09/19/22 5449   metFORMIN (GLUCOPHAGE) tablet 500 mg  500 mg Oral Q breakfast Park Pope, MD   500 mg at 09/19/22 2010   nicotine (NICODERM CQ - dosed in mg/24 hours) patch 14 mg  14 mg Transdermal Q0600 Onuoha, Chinwendu V, NP   14 mg at 09/19/22 0927   ondansetron (ZOFRAN-ODT) disintegrating tablet 4 mg  4 mg Oral Q8H PRN Sindy Guadeloupe, NP   4 mg at 09/14/22 2351   Oxcarbazepine (TRILEPTAL) tablet 300 mg  300 mg Oral BID Onuoha, Chinwendu V, NP   300 mg at 09/19/22 0927   pantoprazole (PROTONIX) EC tablet 20 mg  20 mg Oral Daily Princess Bruins, DO   20 mg at 09/19/22 0712   QUEtiapine (SEROQUEL) tablet 400 mg  400 mg Oral BID Onuoha, Chinwendu V, NP   400 mg at 09/19/22 0928    sertraline (ZOLOFT) tablet 150 mg  150 mg Oral Daily Onuoha, Chinwendu V, NP   150 mg at 09/19/22 0928   traZODone (DESYREL) tablet 100 mg  100 mg Oral QHS Ardis Hughs, NP   100 mg at 09/18/22 2137   valACYclovir (VALTREX) tablet 500 mg  500 mg Oral Daily Princess Bruins, DO   500 mg at 09/19/22 1975   ziprasidone (GEODON) injection 20 mg  20 mg Intramuscular Q12H PRN Onuoha, Chinwendu V, NP   20 mg at 09/13/22 2309   Current Outpatient Medications  Medication Sig Dispense Refill   ABILIFY MAINTENA 400 MG PRSY prefilled syringe 400 mg every 28 (twenty-eight) days.  cetirizine (ZYRTEC) 10 MG tablet Take 10 mg by mouth daily.     cyclobenzaprine (FLEXERIL) 10 MG tablet Take 1 tablet (10 mg total) by mouth 2 (two) times daily as needed for muscle spasms. 20 tablet 0   fluticasone (FLONASE) 50 MCG/ACT nasal spray Place 1 spray into both nostrils daily.     meloxicam (MOBIC) 15 MG tablet Take 15 mg by mouth daily.     Oxcarbazepine (TRILEPTAL) 300 MG tablet Take 1 tablet (300 mg total) by mouth 2 (two) times daily. 60 tablet 0   QUEtiapine (SEROQUEL) 400 MG tablet Take 1 tablet (400 mg total) by mouth 2 (two) times daily. 60 tablet 0   sertraline (ZOLOFT) 50 MG tablet Take 3 tablets (150 mg total) by mouth in the morning. 90 tablet 0   traZODone (DESYREL) 100 MG tablet Take 1 tablet (100 mg total) by mouth at bedtime. 30 tablet 0   valACYclovir (VALTREX) 500 MG tablet Take 500 mg by mouth daily.      Labs  Lab Results:  Admission on 07/25/2022  Component Date Value Ref Range Status   SARS Coronavirus 2 by RT PCR 07/25/2022 NEGATIVE  NEGATIVE Final   Comment: (NOTE) SARS-CoV-2 target nucleic acids are NOT DETECTED.  The SARS-CoV-2 RNA is generally detectable in upper respiratory specimens during the acute phase of infection. The lowest concentration of SARS-CoV-2 viral copies this assay can detect is 138 copies/mL. A negative result does not preclude SARS-Cov-2 infection and should  not be used as the sole basis for treatment or other patient management decisions. A negative result may occur with  improper specimen collection/handling, submission of specimen other than nasopharyngeal swab, presence of viral mutation(s) within the areas targeted by this assay, and inadequate number of viral copies(<138 copies/mL). A negative result must be combined with clinical observations, patient history, and epidemiological information. The expected result is Negative.  Fact Sheet for Patients:  BloggerCourse.com  Fact Sheet for Healthcare Providers:  SeriousBroker.it  This test is no                          t yet approved or cleared by the [place] FDA and  has been authorized for detection and/or diagnosis of SARS-CoV-2 by FDA under an Emergency Use Authorization (EUA). This EUA will remain  in effect (meaning this test can be used) for the duration of the COVID-19 declaration under Section 564(b)(1) of the Act, 21 U.S.C.section 360bbb-3(b)(1), unless the authorization is terminated  or revoked sooner.       Influenza A by PCR 07/25/2022 NEGATIVE  NEGATIVE Final   Influenza B by PCR 07/25/2022 NEGATIVE  NEGATIVE Final   Comment: (NOTE) The Xpert Xpress SARS-CoV-2/FLU/RSV plus assay is intended as an aid in the diagnosis of influenza from Nasopharyngeal swab specimens and should not be used as a sole basis for treatment. Nasal washings and aspirates are unacceptable for Xpert Xpress SARS-CoV-2/FLU/RSV testing.  Fact Sheet for Patients: BloggerCourse.com  Fact Sheet for Healthcare Providers: SeriousBroker.it  This test is not yet approved or cleared by the [place] FDA and has been authorized for detection and/or diagnosis of SARS-CoV-2 by FDA under an Emergency Use Authorization (EUA). This EUA will remain in effect (meaning this test can be used) for the  duration of the COVID-19 declaration under Section 564(b)(1) of the Act, 21 U.S.C. section 360bbb-3(b)(1), unless the authorization is terminated or revoked.  Performed at [facility], [address], [place], [place] [address]    WBC 07/25/2022 8.3  4.0 - 10.5 K/uL Final   RBC 07/25/2022 4.44  3.87 - 5.11 MIL/uL Final   Hemoglobin 07/25/2022 13.7  12.0 - 15.0 g/dL Final   HCT 60/45/4098 40.2  36.0 - 46.0 % Final   MCV 07/25/2022 90.5  80.0 - 100.0 fL Final   MCH 07/25/2022 30.9  26.0 - 34.0 pg Final   MCHC 07/25/2022 34.1  30.0 - 36.0 g/dL Final   RDW 11/91/4782 12.2  11.5 - 15.5 % Final   Platelets 07/25/2022 248  150 - 400 K/uL Final   nRBC 07/25/2022 0.0  0.0 - 0.2 % Final   Neutrophils Relative % 07/25/2022 43  % Final   Neutro Abs 07/25/2022 3.6  1.7 - 7.7 K/uL Final   Lymphocytes Relative 07/25/2022 52  % Final   Lymphs Abs 07/25/2022 4.2 (H)  0.7 - 4.0 K/uL Final   Monocytes Relative 07/25/2022 5  % Final   Monocytes Absolute 07/25/2022 0.4  0.1 - 1.0 K/uL Final   Eosinophils Relative 07/25/2022 0  % Final   Eosinophils Absolute 07/25/2022 0.0  0.0 - 0.5 K/uL Final   Basophils Relative 07/25/2022 0  % Final   Basophils Absolute 07/25/2022 0.0  0.0 - 0.1 K/uL Final   Immature Granulocytes 07/25/2022 0  % Final   Abs Immature Granulocytes 07/25/2022 0.02  0.00 - 0.07 K/uL Final   Performed at [facility], [address] [place], [place] [address]   Sodium 07/25/2022 138  135 - 145 mmol/[name] Final   Potassium 07/25/2022 4.0  3.5 - 5.1 mmol/[name] Final   Chloride 07/25/2022 104  98 - 111 mmol/[name] Final   CO2 07/25/2022 29  22 - 32 mmol/[name] Final   Glucose, Bld 07/25/2022 83  70 - 99 mg/dL Final   Glucose reference range applies only to samples taken after fasting for at least 8 hours.   BUN 07/25/2022 11  6 - 20 mg/dL Final   Creatinine, Ser 07/25/2022 0.97  0.44 - 1.00 mg/dL Final   Calcium 30/86/5784 9.2  8.9 - 10.3 mg/dL Final   Total Protein 69/62/9528 7.0  6.5 -  8.1 g/dL Final   Albumin 41/32/4401 3.8  3.5 - 5.0 g/dL Final   AST 02/72/5366 18  15 - 41 U/[name] Final   ALT 07/25/2022 22  0 - 44 U/[name] Final   Alkaline Phosphatase 07/25/2022 64  38 - 126 U/[name] Final   Total Bilirubin 07/25/2022 0.2 ([name])  0.3 - 1.2 mg/dL Final   GFR, Estimated 07/25/2022 >60  >60 mL/min Final   Comment: (NOTE) Calculated using the CKD-EPI Creatinine Equation (2021)    Anion gap 07/25/2022 5  5 - 15 Final   Performed at [facility], [address] [place], [place] [address]   POC Amphetamine UR 07/25/2022 None Detected  NONE DETECTED (Cut Off Level 1000 ng/mL) Preliminary   POC Secobarbital (BAR) 07/25/2022 None Detected  NONE DETECTED (Cut Off Level 300 ng/mL) Preliminary   POC Buprenorphine (BUP) 07/25/2022 None Detected  NONE DETECTED (Cut Off Level 10 ng/mL) Preliminary   POC Oxazepam (BZO) 07/25/2022 None Detected  NONE DETECTED (Cut Off Level 300 ng/mL) Preliminary   POC Cocaine UR 07/25/2022 None Detected  NONE DETECTED (Cut Off Level 300 ng/mL) Preliminary   POC Methamphetamine UR 07/25/2022 None Detected  NONE DETECTED (Cut Off Level 1000 ng/mL) Preliminary   POC Morphine 07/25/2022 None Detected  NONE DETECTED (Cut Off Level  300 ng/mL) Preliminary   POC Methadone UR 07/25/2022 None Detected  NONE DETECTED (Cut Off Level 300 ng/mL) Preliminary   POC Oxycodone UR 07/25/2022 None Detected  NONE DETECTED (Cut Off Level 100 ng/mL) Preliminary   POC Marijuana UR 07/25/2022 None Detected  NONE DETECTED (Cut Off Level 50 ng/mL) Preliminary   SARSCOV2ONAVIRUS 2 AG 07/25/2022 NEGATIVE  NEGATIVE Final   Comment: (NOTE) SARS-CoV-2 antigen NOT DETECTED.   Negative results are presumptive.  Negative results do not preclude SARS-CoV-2 infection and should not be used as the sole basis for treatment or other patient management decisions, including infection  control decisions, particularly in the presence of clinical signs and  symptoms consistent with COVID-19, or in those  who have been in contact with the virus.  Negative results must be combined with clinical observations, patient history, and epidemiological information. The expected result is Negative.  Fact Sheet for Patients: https://www.jennings-kim.com/https://www.fda.gov/media/141569/download  Fact Sheet for Healthcare Providers: https://alexander-rogers.biz/https://www.fda.gov/media/141568/download  This test is not yet approved or cleared by the [place] FDA and  has been authorized for detection and/or diagnosis of SARS-CoV-2 by FDA under an Emergency Use Authorization (EUA).  This EUA will remain in effect (meaning this test can be used) for the duration of  the COV                          ID-19 declaration under Section 564(b)(1) of the Act, 21 U.S.C. section 360bbb-3(b)(1), unless the authorization is terminated or revoked sooner.     Cholesterol 07/25/2022 181  0 - 200 mg/dL Final   Triglycerides 98/11/914710/09/2022 118  <150 mg/dL Final   HDL 82/95/621310/09/2022 45  >40 mg/dL Final   Total CHOL/HDL Ratio 07/25/2022 4.0  RATIO Final   VLDL 07/25/2022 24  0 - 40 mg/dL Final   LDL Cholesterol 07/25/2022 112 (H)  0 - 99 mg/dL Final   Comment:        Total Cholesterol/HDL:CHD Risk Coronary Heart Disease Risk Table                     Men   Women  1/2 Average Risk   3.4   3.3  Average Risk       5.0   4.4  2 X Average Risk   9.6   7.1  3 X Average Risk  23.4   11.0        Use the calculated Patient Ratio above and the CHD Risk Table to determine the patient's CHD Risk.        ATP III CLASSIFICATION (LDL):  <100     mg/dL   Optimal  [id number]  mg/dL   Near or Above                    Optimal  130-159  mg/dL   Borderline  [phone]  mg/dL   High  >528>190     mg/dL   Very High Performed at [facility], [address] [place], [place] [address]    TSH 07/25/2022 6.668 (H)  0.350 - 4.500 uIU/mL Final   Comment: Performed by a 3rd Generation assay with a functional sensitivity of <=0.01 uIU/mL. Performed at [facility], [address]  [place], [place] [address]    Glucose-Capillary 07/26/2022 104 (H)  70 - 99 mg/dL Final   Glucose reference range applies only to samples taken after fasting for at least 8 hours.  T3, Free 07/31/2022 2.3  2.0 - 4.4 pg/mL Final   Comment: (NOTE) Performed At: [facility] [address] [place], [place] [id number] Jolene Schimke MD YI:[id number]    Free T4 07/31/2022 0.60 ([name])  0.61 - 1.12 ng/dL Final   Comment: (NOTE) Biotin ingestion may interfere with free T4 tests. If the results are inconsistent with the TSH level, previous test results, or the clinical presentation, then consider biotin interference. If needed, order repeat testing after stopping biotin. Performed at [facility], [address] [place], [place] [address]    Glucose-Capillary 08/29/2022 100 (H)  70 - 99 mg/dL Final   Glucose reference range applies only to samples taken after fasting for at least 8 hours.   TSH 09/05/2022 0.793  0.350 - 4.500 uIU/mL Final   Comment: Performed by a 3rd Generation assay with a functional sensitivity of <=0.01 uIU/mL. Performed at [facility], [address] [place], [place] [address]    Free T4 09/05/2022 0.73  0.61 - 1.12 ng/dL Final   Comment: (NOTE) Biotin ingestion may interfere with free T4 tests. If the results are inconsistent with the TSH level, previous test results, or the clinical presentation, then consider biotin interference. If needed, order repeat testing after stopping biotin. Performed at [facility], [address], [place], [place] [address]    Preg Test, Ur 09/06/2022 Negative  Negative Final   Preg Test, Ur 09/06/2022 NEGATIVE  NEGATIVE Final   Comment:        THE SENSITIVITY OF THIS METHODOLOGY IS >24 mIU/mL    Preg Test, Ur 09/05/2022 NEGATIVE  NEGATIVE Final   Comment:        THE SENSITIVITY OF THIS METHODOLOGY IS >24 mIU/mL    Sodium 09/13/2022 136  135 - 145 mmol/[name] Final   Potassium 09/13/2022 4.5  3.5 - 5.1 mmol/[name]  Final   Chloride 09/13/2022 105  98 - 111 mmol/[name] Final   CO2 09/13/2022 21 ([name])  22 - 32 mmol/[name] Final   Glucose, Bld 09/13/2022 111 (H)  70 - 99 mg/dL Final   Glucose reference range applies only to samples taken after fasting for at least 8 hours.   BUN 09/13/2022 14  6 - 20 mg/dL Final   Creatinine, Ser 09/13/2022 0.92  0.44 - 1.00 mg/dL Final   Calcium 36/62/9476 9.1  8.9 - 10.3 mg/dL Final   GFR, Estimated 09/13/2022 >60  >60 mL/min Final   Comment: (NOTE) Calculated using the CKD-EPI Creatinine Equation (2021)    Anion gap 09/13/2022 10  5 - 15 Final   Performed at [facility], [address], [place], [place] [address]   Vitamin B-12 09/13/2022 585  180 - 914 pg/mL Final   Comment: (NOTE) This assay is not validated for testing neonatal or myeloproliferative syndrome specimens for Vitamin B12 levels. Performed at [facility], [address] [place], [place] [address]    Color, Urine 09/14/2022 YELLOW  YELLOW Final   APPearance 09/14/2022 HAZY (A)  CLEAR Final   Specific Gravity, Urine 09/14/2022 1.025  1.005 - 1.030 Final   pH 09/14/2022 5.0  5.0 - 8.0 Final   Glucose, UA 09/14/2022 NEGATIVE  NEGATIVE mg/dL Final   Hgb urine dipstick 09/14/2022 NEGATIVE  NEGATIVE Final   Bilirubin Urine 09/14/2022 NEGATIVE  NEGATIVE Final   Ketones, ur 09/14/2022 NEGATIVE  NEGATIVE mg/dL Final   Protein, ur 68/09/7516 NEGATIVE  NEGATIVE mg/dL Final   Nitrite 00/17/4944 NEGATIVE  NEGATIVE Final   Leukocytes,Ua 09/14/2022 TRACE (A)  NEGATIVE Final   RBC / HPF 09/14/2022 0-5  0 - 5 RBC/hpf Final   WBC, UA 09/14/2022 0-5  0 - 5 WBC/hpf Final   Bacteria, UA 09/14/2022 RARE (A)  NONE SEEN Final   Squamous Epithelial / LPF 09/14/2022 0-5  0 - 5 Final   Mucus 09/14/2022 PRESENT   Final   Performed at [facility], [address], [place], [place] [address]   Glucose-Capillary 09/16/2022 105 (H)  70 - 99 mg/dL Final   Glucose reference range applies only to samples taken after  fasting for at least 8 hours.  Admission on 07/04/2022, Discharged on 07/05/2022  Component Date Value Ref Range Status   Sodium 07/04/2022 142  135 - 145 mmol/[name] Final   Potassium 07/04/2022 4.4  3.5 - 5.1 mmol/[name] Final   Chloride 07/04/2022 109  98 - 111 mmol/[name] Final   CO2 07/04/2022 26  22 - 32 mmol/[name] Final   Glucose, Bld 07/04/2022 96  70 - 99 mg/dL Final   Glucose reference range applies only to samples taken after fasting for at least 8 hours.   BUN 07/04/2022 15  6 - 20 mg/dL Final   Creatinine, Ser 07/04/2022 0.83  0.44 - 1.00 mg/dL Final   Calcium 60/45/4098 9.3  8.9 - 10.3 mg/dL Final   Total Protein 11/91/4782 7.2  6.5 - 8.1 g/dL Final   Albumin 95/62/1308 3.9  3.5 - 5.0 g/dL Final   AST 65/78/4696 23  15 - 41 U/[name] Final   ALT 07/04/2022 28  0 - 44 U/[name] Final   Alkaline Phosphatase 07/04/2022 77  38 - 126 U/[name] Final   Total Bilirubin 07/04/2022 0.4  0.3 - 1.2 mg/dL Final   GFR, Estimated 07/04/2022 >60  >60 mL/min Final   Comment: (NOTE) Calculated using the CKD-EPI Creatinine Equation (2021)    Anion gap 07/04/2022 7  5 - 15 Final   Performed at [facility], [address], [place], [place] [address]   Alcohol, Ethyl (B) 07/04/2022 <10  <10 mg/dL Final   Comment: (NOTE) Lowest detectable limit for serum alcohol is 10 mg/dL.  For medical purposes only. Performed at [facility], [address], [place], [place] [address]    WBC 07/04/2022 7.0  4.0 - 10.5 K/uL Final   RBC 07/04/2022 4.18  3.87 - 5.11 MIL/uL Final   Hemoglobin 07/04/2022 12.8  12.0 - 15.0 g/dL Final   HCT 40/07/2724 39.1  36.0 - 46.0 % Final   MCV 07/04/2022 93.5  80.0 - 100.0 fL Final   MCH 07/04/2022 30.6  26.0 - 34.0 pg Final   MCHC 07/04/2022 32.7  30.0 - 36.0 g/dL Final   RDW 36/64/4034 12.9  11.5 - 15.5 % Final   Platelets 07/04/2022 243  150 - 400 K/uL Final   nRBC 07/04/2022 0.0  0.0 - 0.2 % Final   Neutrophils Relative % 07/04/2022 43  % Final   Neutro Abs  07/04/2022 3.0  1.7 - 7.7 K/uL Final   Lymphocytes Relative 07/04/2022 50  % Final   Lymphs Abs 07/04/2022 3.5  0.7 - 4.0 K/uL Final   Monocytes Relative 07/04/2022 7  % Final   Monocytes Absolute 07/04/2022 0.5  0.1 - 1.0 K/uL Final   Eosinophils Relative 07/04/2022 0  % Final   Eosinophils Absolute 07/04/2022 0.0  0.0 - 0.5 K/uL Final   Basophils Relative 07/04/2022 0  % Final  Basophils Absolute 07/04/2022 0.0  0.0 - 0.1 K/uL Final   Immature Granulocytes 07/04/2022 0  % Final   Abs Immature Granulocytes 07/04/2022 0.01  0.00 - 0.07 K/uL Final   Performed at [facility], [address] [place], [place] [address]   I-stat hCG, quantitative 07/04/2022 <5.0  <5 mIU/mL Final   Comment 3 07/04/2022          Final   Comment:   GEST. AGE      CONC.  (mIU/mL)   <=1 WEEK        5 - 50     2 WEEKS       50 - 500     3 WEEKS       100 - 10,000     4 WEEKS     1,000 - 30,000        FEMALE AND NON-PREGNANT FEMALE:     LESS THAN 5 mIU/mL   Admission on 06/14/2022, Discharged on 06/14/2022  Component Date Value Ref Range Status   Color, UA 06/14/2022 yellow  yellow Final   Clarity, UA 06/14/2022 cloudy (A)  clear Final   Glucose, UA 06/14/2022 negative  negative mg/dL Final   Bilirubin, UA 60/45/4098 negative  negative Final   Ketones, POC UA 06/14/2022 negative  negative mg/dL Final   Spec Grav, UA 11/91/4782 1.025  1.010 - 1.025 Final   Blood, UA 06/14/2022 negative  negative Final   pH, UA 06/14/2022 7.5  5.0 - 8.0 Final   Protein Ur, POC 06/14/2022 =30 (A)  negative mg/dL Final   Urobilinogen, UA 06/14/2022 0.2  0.2 or 1.0 E.U./dL Final   Nitrite, UA 95/62/1308 Negative  Negative Final   Leukocytes, UA 06/14/2022 Negative  Negative Final   Preg Test, Ur 06/14/2022 Negative  Negative Final   Specimen Description 06/14/2022 URINE, CLEAN CATCH   Final   Special Requests 06/14/2022    Final                   Value:NONE Performed at [facility], [address], [place], [place] [address]    Culture 06/14/2022 MULTIPLE SPECIES PRESENT, SUGGEST RECOLLECTION (A)   Final   Report Status 06/14/2022 06/16/2022 FINAL   Final  Admission on 06/07/2022, Discharged on 06/10/2022  Component Date Value Ref Range Status   SARS Coronavirus 2 by RT PCR 06/07/2022 NEGATIVE  NEGATIVE Final   Comment: (NOTE) SARS-CoV-2 target nucleic acids are NOT DETECTED.  The SARS-CoV-2 RNA is generally detectable in upper respiratory specimens during the acute phase of infection. The lowest concentration of SARS-CoV-2 viral copies this assay can detect is 138 copies/mL. A negative result does not preclude SARS-Cov-2 infection and should not be used as the sole basis for treatment or other patient management decisions. A negative result may occur with  improper specimen collection/handling, submission of specimen other than nasopharyngeal swab, presence of viral mutation(s) within the areas targeted by this assay, and inadequate number of viral copies(<138 copies/mL). A negative result must be combined with clinical observations, patient history, and epidemiological information. The expected result is Negative.  Fact Sheet for Patients:  BloggerCourse.com  Fact Sheet for Healthcare Providers:  SeriousBroker.it  This test is no                          t yet approved or cleared by the [place] FDA and  has been authorized for detection and/or diagnosis  of SARS-CoV-2 by FDA under an Emergency Use Authorization (EUA). This EUA will remain  in effect (meaning this test can be used) for the duration of the COVID-19 declaration under Section 564(b)(1) of the Act, 21 U.S.C.section 360bbb-3(b)(1), unless the authorization is terminated  or revoked sooner.       Influenza A by PCR 06/07/2022 NEGATIVE  NEGATIVE Final   Influenza B by PCR 06/07/2022 NEGATIVE  NEGATIVE Final   Comment: (NOTE) The Xpert Xpress  SARS-CoV-2/FLU/RSV plus assay is intended as an aid in the diagnosis of influenza from Nasopharyngeal swab specimens and should not be used as a sole basis for treatment. Nasal washings and aspirates are unacceptable for Xpert Xpress SARS-CoV-2/FLU/RSV testing.  Fact Sheet for Patients: BloggerCourse.com  Fact Sheet for Healthcare Providers: SeriousBroker.it  This test is not yet approved or cleared by the [place] FDA and has been authorized for detection and/or diagnosis of SARS-CoV-2 by FDA under an Emergency Use Authorization (EUA). This EUA will remain in effect (meaning this test can be used) for the duration of the COVID-19 declaration under Section 564(b)(1) of the Act, 21 U.S.C. section 360bbb-3(b)(1), unless the authorization is terminated or revoked.  Performed at [facility], [address], [place], [place] [address]    WBC 06/07/2022 5.7  4.0 - 10.5 K/uL Final   RBC 06/07/2022 4.39  3.87 - 5.11 MIL/uL Final   Hemoglobin 06/07/2022 13.2  12.0 - 15.0 g/dL Final   HCT 60/45/4098 40.4  36.0 - 46.0 % Final   MCV 06/07/2022 92.0  80.0 - 100.0 fL Final   MCH 06/07/2022 30.1  26.0 - 34.0 pg Final   MCHC 06/07/2022 32.7  30.0 - 36.0 g/dL Final   RDW 11/91/4782 12.4  11.5 - 15.5 % Final   Platelets 06/07/2022 308  150 - 400 K/uL Final   nRBC 06/07/2022 0.0  0.0 - 0.2 % Final   Neutrophils Relative % 06/07/2022 42  % Final   Neutro Abs 06/07/2022 2.4  1.7 - 7.7 K/uL Final   Lymphocytes Relative 06/07/2022 54  % Final   Lymphs Abs 06/07/2022 3.1  0.7 - 4.0 K/uL Final   Monocytes Relative 06/07/2022 4  % Final   Monocytes Absolute 06/07/2022 0.2  0.1 - 1.0 K/uL Final   Eosinophils Relative 06/07/2022 0  % Final   Eosinophils Absolute 06/07/2022 0.0  0.0 - 0.5 K/uL Final   Basophils Relative 06/07/2022 0  % Final   Basophils Absolute 06/07/2022 0.0  0.0 - 0.1 K/uL Final   Immature Granulocytes 06/07/2022 0  %  Final   Abs Immature Granulocytes 06/07/2022 0.01  0.00 - 0.07 K/uL Final   Performed at [facility], [address], [place], [place] [address]   Sodium 06/07/2022 139  135 - 145 mmol/[name] Final   Potassium 06/07/2022 4.0  3.5 - 5.1 mmol/[name] Final   Chloride 06/07/2022 104  98 - 111 mmol/[name] Final   CO2 06/07/2022 28  22 - 32 mmol/[name] Final   Glucose, Bld 06/07/2022 104 (H)  70 - 99 mg/dL Final   Glucose reference range applies only to samples taken after fasting for at least 8 hours.   BUN 06/07/2022 8  6 - 20 mg/dL Final   Creatinine, Ser 06/07/2022 0.84  0.44 - 1.00 mg/dL Final   Calcium 30/86/5784 9.1  8.9 - 10.3 mg/dL Final   Total Protein 69/62/9528 6.9  6.5 - 8.1 g/dL Final   Albumin 41/32/4401 3.7  3.5 - 5.0  g/dL Final   AST 16/07/9603 19  15 - 41 U/[name] Final   ALT 06/07/2022 22  0 - 44 U/[name] Final   Alkaline Phosphatase 06/07/2022 54  38 - 126 U/[name] Final   Total Bilirubin 06/07/2022 0.4  0.3 - 1.2 mg/dL Final   GFR, Estimated 06/07/2022 >60  >60 mL/min Final   Comment: (NOTE) Calculated using the CKD-EPI Creatinine Equation (2021)    Anion gap 06/07/2022 7  5 - 15 Final   Performed at [facility], [address] [place], [place] [address]   Hgb A1c MFr Bld 06/07/2022 5.0  4.8 - 5.6 % Final   Comment: (NOTE) Pre diabetes:          5.7%-6.4%  Diabetes:              >6.4%  Glycemic control for   <7.0% adults with diabetes    Mean Plasma Glucose 06/07/2022 96.8  mg/dL Final   Performed at [facility], [address] [place], [place] [address]   TSH 06/07/2022 1.620  0.350 - 4.500 uIU/mL Final   Comment: Performed by a 3rd Generation assay with a functional sensitivity of <=0.01 uIU/mL. Performed at [facility], [address] [place], [place] [address]    RPR Ser Ql 06/07/2022 NON REACTIVE  NON REACTIVE Final   Performed at [facility], [address], [place], [place] [address]   Color, Urine 06/07/2022 YELLOW  YELLOW Final   APPearance  06/07/2022 HAZY (A)  CLEAR Final   Specific Gravity, Urine 06/07/2022 1.018  1.005 - 1.030 Final   pH 06/07/2022 7.0  5.0 - 8.0 Final   Glucose, UA 06/07/2022 NEGATIVE  NEGATIVE mg/dL Final   Hgb urine dipstick 06/07/2022 NEGATIVE  NEGATIVE Final   Bilirubin Urine 06/07/2022 NEGATIVE  NEGATIVE Final   Ketones, ur 06/07/2022 NEGATIVE  NEGATIVE mg/dL Final   Protein, ur 86/57/8469 NEGATIVE  NEGATIVE mg/dL Final   Nitrite 62/95/2841 NEGATIVE  NEGATIVE Final   Leukocytes,Ua 06/07/2022 NEGATIVE  NEGATIVE Final   Performed at [facility], [address] [place], [place] [address]   Cholesterol 06/07/2022 164  0 - 200 mg/dL Final   Triglycerides 08/10/2535 158 (H)  <150 mg/dL Final   HDL 64/40/3474 44  >40 mg/dL Final   Total CHOL/HDL Ratio 06/07/2022 3.7  RATIO Final   VLDL 06/07/2022 32  0 - 40 mg/dL Final   LDL Cholesterol 06/07/2022 88  0 - 99 mg/dL Final   Comment:        Total Cholesterol/HDL:CHD Risk Coronary Heart Disease Risk Table                     Men   Women  1/2 Average Risk   3.4   3.3  Average Risk       5.0   4.4  2 X Average Risk   9.6   7.1  3 X Average Risk  23.4   11.0        Use the calculated Patient Ratio above and the CHD Risk Table to determine the patient's CHD Risk.        ATP III CLASSIFICATION (LDL):  <100     mg/dL   Optimal  259-563  mg/dL   Near or Above                    Optimal  130-159  mg/dL   Borderline  875-643  mg/dL   High  >161     mg/dL   Very High Performed at [facility], [address], [place], [place] [address]    HIV Screen 4th Generation wRfx 06/07/2022 Non Reactive  Non Reactive Final   Performed at [facility], [address] [place], [place] [address]   SARSCOV2ONAVIRUS 2 AG 06/07/2022 NEGATIVE  NEGATIVE Final   Comment: (NOTE) SARS-CoV-2 antigen NOT DETECTED.   Negative results are presumptive.  Negative results do not preclude SARS-CoV-2 infection and should not be used as the sole basis  for treatment or other patient management decisions, including infection  control decisions, particularly in the presence of clinical signs and  symptoms consistent with COVID-19, or in those who have been in contact with the virus.  Negative results must be combined with clinical observations, patient history, and epidemiological information. The expected result is Negative.  Fact Sheet for Patients: https://www.jennings-kim.com/  Fact Sheet for Healthcare Providers: https://alexander-rogers.biz/  This test is not yet approved or cleared by the [place] FDA and  has been authorized for detection and/or diagnosis of SARS-CoV-2 by FDA under an Emergency Use Authorization (EUA).  This EUA will remain in effect (meaning this test can be used) for the duration of  the COV                          ID-19 declaration under Section 564(b)(1) of the Act, 21 U.S.C. section 360bbb-3(b)(1), unless the authorization is terminated or revoked sooner.     POC Amphetamine UR 06/07/2022 None Detected  NONE DETECTED (Cut Off Level 1000 ng/mL) Final   POC Secobarbital (BAR) 06/07/2022 None Detected  NONE DETECTED (Cut Off Level 300 ng/mL) Final   POC Buprenorphine (BUP) 06/07/2022 None Detected  NONE DETECTED (Cut Off Level 10 ng/mL) Final   POC Oxazepam (BZO) 06/07/2022 None Detected  NONE DETECTED (Cut Off Level 300 ng/mL) Final   POC Cocaine UR 06/07/2022 None Detected  NONE DETECTED (Cut Off Level 300 ng/mL) Final   POC Methamphetamine UR 06/07/2022 None Detected  NONE DETECTED (Cut Off Level 1000 ng/mL) Final   POC Morphine 06/07/2022 None Detected  NONE DETECTED (Cut Off Level 300 ng/mL) Final   POC Methadone UR 06/07/2022 None Detected  NONE DETECTED (Cut Off Level 300 ng/mL) Final   POC Oxycodone UR 06/07/2022 None Detected  NONE DETECTED (Cut Off Level 100 ng/mL) Final   POC Marijuana UR 06/07/2022 None Detected  NONE DETECTED (Cut Off Level 50 ng/mL) Final   Preg  Test, Ur 06/08/2022 NEGATIVE  NEGATIVE Final   Comment:        THE SENSITIVITY OF THIS METHODOLOGY IS >20 mIU/mL. Performed at [facility], [address], [place], [place] [address]    Preg Test, Ur 06/08/2022 NEGATIVE  NEGATIVE Final   Comment:        THE SENSITIVITY OF THIS METHODOLOGY IS >24 mIU/mL     Blood Alcohol level:  Lab Results  Component Value Date   ETH <10 07/04/2022   ETH <10 02/07/2022    Metabolic Disorder Labs: Lab Results  Component Value Date   HGBA1C 5.0 06/07/2022   MPG 96.8 06/07/2022   MPG 96.8 02/07/2022   No results found for: "PROLACTIN" Lab Results  Component Value Date   CHOL 181 07/25/2022   TRIG 118 07/25/2022   HDL 45 07/25/2022   CHOLHDL 4.0 07/25/2022   VLDL 24 07/25/2022   LDLCALC  112 (H) 07/25/2022   LDLCALC 88 06/07/2022    Therapeutic Lab Levels: No results found for: "LITHIUM" No results found for: "VALPROATE" No results found for: "CBMZ"  Physical Findings   GAD-7    Flowsheet Row [facility]17/2021 [facility]  Total GAD-7 Score 17      PHQ2-9    Flowsheet Row ED from 06/07/2022 in [facility] Office Visit from 12/17/2021 in [facility]  PHQ-2 Total Score 1 2  PHQ-9 Total Score 2 8      Flowsheet Row ED from 07/25/2022 in [facility] ED from 07/04/2022 [facility] ED from 06/14/2022 in [facility]   C-SSRS RISK CATEGORY High Risk No Risk No Risk        Musculoskeletal  Strength & Muscle Tone: within normal limits Gait & Station: normal Patient leans: N/A  Psychiatric Specialty Exam  Presentation  General Appearance:  Appropriate for Environment  Eye Contact: Fair  Speech: Clear and Coherent  Speech Volume: Normal  Handedness: Right   Mood and Affect   Mood: Euthymic  Affect: Congruent   Thought Process  Thought Processes: Coherent; Goal Directed  Descriptions of Associations:Intact  Orientation:Full (Time, Place and Person)  Thought Content:Logical  Diagnosis of Schizophrenia or Schizoaffective disorder in past: Yes  Duration of Psychotic Symptoms: Greater than six months   Hallucinations:Hallucinations: None  Ideas of Reference:None  Suicidal Thoughts:Suicidal Thoughts: No  Homicidal Thoughts:Homicidal Thoughts: No   Sensorium  Memory: Immediate Fair; Recent Fair; Remote Fair  Judgment: Fair  Insight: Fair   Art therapist  Concentration: Fair  Attention Span: Fair  Recall: [employer] of Knowledge: Fair  Language: Fair   Psychomotor Activity  Psychomotor Activity: Psychomotor Activity: Normal   Assets  Assets: Communication Skills; Desire for Improvement; Financial Resources/Insurance   Sleep  Sleep: Sleep: Fair   Nutritional Assessment (For OBS and [facility] admissions only) Has the patient had a weight loss or gain of 10 pounds or more in the last 3 months?: No Has the patient had a decrease in food intake/or appetite?: No Does the patient have dental problems?: No Does the patient have eating habits or behaviors that may be indicators of an eating disorder including binging or inducing vomiting?: No Has the patient recently lost weight without trying?: 0 Has the patient been eating poorly because of a decreased appetite?: 0 Malnutrition Screening Tool Score: 0    Physical Exam  Physical Exam HENT:     Head: Normocephalic.     Nose: Nose normal.  Cardiovascular:     Rate and Rhythm: Normal rate.  Pulmonary:     Effort: Pulmonary effort is normal.  Musculoskeletal:        General: Normal range of motion.     Cervical back: Normal range of motion.  Neurological:     Mental Status: She is alert and oriented to person, place, and time.    Review of Systems   Constitutional: Negative.   HENT: Negative.    Eyes: Negative.   Respiratory: Negative.    Cardiovascular: Negative.   Gastrointestinal: Negative.   Genitourinary: Negative.   Musculoskeletal: Negative.   Neurological: Negative.   Endo/Heme/Allergies: Negative.    Blood pressure 101/76, pulse (!) 104, temperature 98.2 F (36.8 C), temperature source Oral, resp. rate 20, SpO2 99 %. There is no height or weight on file to  calculate BMI.  Treatment Plan Summary: Disposition-Patient remains cleared by psychiatry. Transition of care team is pursuing appropriate placement.   Labs reviewed.  No Nicole lab results   Vital signs reviewed.  Mild tachycardia, otherwise WNL.   Current medication regimen. No medication changes on 09/20/22.  ARIPiprazole ER  400 mg Intramuscular Q28 days   docusate sodium  100 mg Oral Daily   levothyroxine  100 mcg Oral Q0600   loratadine  10 mg Oral Daily   metFORMIN  500 mg Oral Q breakfast   nicotine  14 mg Transdermal Q0600   Oxcarbazepine  300 mg Oral BID   pantoprazole  20 mg Oral Daily   QUEtiapine  400 mg Oral BID   sertraline  150 mg Oral Daily   traZODone  100 mg Oral QHS   valACYclovir  500 mg Oral Daily    Nicole Nixon L, NP 09/19/2022 1:06 PM

## 2022-09-19 NOTE — ED Notes (Signed)
Pt sleeping t present, no distress noted.  Monitoring for safety.
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
## 2022-09-19 NOTE — ED Notes (Signed)
Pt A&O x 4, awake, pacing & restless at present. Cooperative, No distress noted.  Monitoring for safety.

## 2022-09-20 DIAGNOSIS — F333 Major depressive disorder, recurrent, severe with psychotic symptoms: Secondary | ICD-10-CM | POA: Diagnosis not present

## 2022-09-20 NOTE — ED Provider Notes (Signed)
Behavioral Health Progress Note  Date and Time: 09/20/2022 8:59 AM Name: Nicole [name] MRN:  [id number]   Per HPI: Nicole [name] is a 31 year old female with a past psychiatric history significant for schizophrenia and major depressive disorder with psychosis who voluntarily presented to [facility] on 07/25/2022 via [facility] after a verbal altercation with [name] (not patient's legal guardian) for transient SI. This is patient's fourth visit to [facility] for similar concerns this year. Patient has been dismissed from previous group home due to threatening suicidal and homicidal ideations, then leaving the group home. Legal Guardian: [name]) transitioning to be [name]) Point of contact: [name]).   Subjective:  Patient seen and evaluated face to face by this provider and chart reviewed. On evaluation, patient is alert and oriented x 4. Her thought process is logical and speech is clear and coherent at a moderate tone. Her mood is dysphoric and affect is congruent.   Patient states, "I feel terrible." She states that she called her father last night and he told her to call him in the morning because it was his wife's time. She states that she called her father this morning and he did not answer. She states that she feels like her father does not care about her. We discussed ways to cope this morning and ways to work on self control such as deep breathing and coloring.   She denies SI/HI/AVH. There is no objective evidence that the patient is currently responding to internal or external stimuli. She reports fair sleep. She reports a normal appetite. She is compliant with talking scheduled medications and denies medication side effects. She denies physical symptoms.   Diagnosis:  Final diagnoses:  Severe episode of recurrent major depressive disorder, with psychotic features ([place])    Total Time spent with patient: 15 minutes  Past Psychiatric History: History of  schizoaffective, borderline IDD, anxiety, and MDD.   Past Medical History:  Past Medical History:  Diagnosis Date   Anxiety    Depression    Hypothyroidism 08/07/2022   Tobacco use disorder 08/07/2022    Past Surgical History:  Procedure Laterality Date   WISDOM TOOTH EXTRACTION Bilateral 2020   Family History:  Family History  Problem Relation Age of Onset   Hypertension Father    Diabetes Father    Family Psychiatric  History: No history reported.  Social History:  Social History   Substance and Sexual Activity  Alcohol Use Never     Social History   Substance and Sexual Activity  Drug Use Never    Social History   Socioeconomic History   Marital status: Single    Spouse name: Not on file   Number of children: Not on file   Years of education: Not on file   Highest education level: Not on file  Occupational History   Not on file  Tobacco Use   Smoking status: Every Day    Types: Cigarettes   Smokeless tobacco: Never  Vaping Use   Vaping Use: Some days  Substance and Sexual Activity   Alcohol use: Never   Drug use: Never   Sexual activity: Yes    Partners: Female    Birth control/protection: Implant  Other Topics Concern   Not on file  Social History Narrative   Not on file   Social Determinants of Health   Financial Resource Strain: Not on file  Food Insecurity: Not on file  Transportation Needs: Not on file  Physical Activity: Not  on file  Stress: Not on file  Social Connections: Not on file   SDOH:  SDOH Screenings   Depression (PHQ2-9): Low Risk  (06/10/2022)  Tobacco Use: High Risk (09/18/2022)   Additional Social History:    Pain Medications: SEE MAR Prescriptions: SEE MAR Over the Counter: SEE MAR History of alcohol / drug use?: Yes Longest period of sobriety (when/how long): N/A Negative Consequences of Use:  (NONE) Withdrawal Symptoms: None Name of Substance 1: ETOH IN PAST--PT CURRENTLY DENIES 1 - Frequency: SOCIAL USE ETOH  IN PAST 1 - Method of Aquiring: LEGAL 1- Route of Use: ORAL DRINK Name of Substance 2: [place] 2 - Amount (size/oz): LESS THAN ONE PACK 2 - Frequency: DAILY 2 - Method of Aquiring: LEGAL 2 - Route of Substance Use: ORAL SMOKE    Current Medications:  Current Facility-Administered Medications  Medication Dose Route Frequency Provider Last Rate Last Admin   acetaminophen (TYLENOL) tablet 650 mg  650 mg Oral Q6H PRN Onuoha, Chinwendu V, NP   650 mg at 09/16/22 1619   alum & mag hydroxide-simeth (MAALOX/MYLANTA) 200-200-20 MG/5ML suspension 30 mL  30 mL Oral Q4H PRN Onuoha, Chinwendu V, NP   30 mL at 07/27/22 2151   ARIPiprazole ER (ABILIFY MAINTENA) injection 400 mg  400 mg Intramuscular Q28 days Merrily Brittle, DO   400 mg at 08/30/22 1338   docusate sodium (COLACE) capsule 100 mg  100 mg Oral Daily Rosezetta Schlatter, MD   100 mg at 09/19/22 0927   hydrOXYzine (ATARAX) tablet 25 mg  25 mg Oral TID PRN Onuoha, Chinwendu V, NP   25 mg at 09/19/22 2120   ibuprofen (ADVIL) tablet 400 mg  400 mg Oral Q6H PRN Onuoha, Chinwendu V, NP   400 mg at 09/17/22 0905   levothyroxine (SYNTHROID) tablet 100 mcg  100 mcg Oral Q0600 Merrily Brittle, DO   100 mcg at 09/20/22 [id number]   loratadine (CLARITIN) tablet 10 mg  10 mg Oral Daily Rankin, Shuvon B, NP   10 mg at 09/19/22 [id number]   metFORMIN (GLUCOPHAGE) tablet 500 mg  500 mg Oral Q breakfast France Ravens, MD   500 mg at 09/19/22 [id number]   nicotine (NICODERM CQ - dosed in mg/24 hours) patch 14 mg  14 mg Transdermal Q0600 Onuoha, Chinwendu V, NP   14 mg at 09/19/22 0927   ondansetron (ZOFRAN-ODT) disintegrating tablet 4 mg  4 mg Oral Q8H PRN Evette Georges, NP   4 mg at 09/14/22 2351   Oxcarbazepine (TRILEPTAL) tablet 300 mg  300 mg Oral BID Onuoha, Chinwendu V, NP   300 mg at 09/19/22 2120   pantoprazole (PROTONIX) EC tablet 20 mg  20 mg Oral Daily Merrily Brittle, DO   20 mg at 09/19/22 [id number]   QUEtiapine (SEROQUEL) tablet 400 mg  400 mg Oral BID Onuoha,  Chinwendu V, NP   400 mg at 09/19/22 2120   sertraline (ZOLOFT) tablet 150 mg  150 mg Oral Daily Onuoha, Chinwendu V, NP   150 mg at 09/19/22 0928   traZODone (DESYREL) tablet 100 mg  100 mg Oral QHS Revonda Humphrey, NP   100 mg at 09/19/22 2120   valACYclovir (VALTREX) tablet 500 mg  500 mg Oral Daily Merrily Brittle, DO   500 mg at 09/19/22 [id number]   ziprasidone (GEODON) injection 20 mg  20 mg Intramuscular Q12H PRN Onuoha, Chinwendu V, NP   20 mg at 09/19/22 2133   Current Outpatient Medications  Medication Sig Dispense Refill  ABILIFY MAINTENA 400 MG PRSY prefilled syringe 400 mg every 28 (twenty-eight) days.     cetirizine (ZYRTEC) 10 MG tablet Take 10 mg by mouth daily.     cyclobenzaprine (FLEXERIL) 10 MG tablet Take 1 tablet (10 mg total) by mouth 2 (two) times daily as needed for muscle spasms. 20 tablet 0   fluticasone (FLONASE) 50 MCG/ACT nasal spray Place 1 spray into both nostrils daily.     meloxicam (MOBIC) 15 MG tablet Take 15 mg by mouth daily.     Oxcarbazepine (TRILEPTAL) 300 MG tablet Take 1 tablet (300 mg total) by mouth 2 (two) times daily. 60 tablet 0   QUEtiapine (SEROQUEL) 400 MG tablet Take 1 tablet (400 mg total) by mouth 2 (two) times daily. 60 tablet 0   sertraline (ZOLOFT) 50 MG tablet Take 3 tablets (150 mg total) by mouth in the morning. 90 tablet 0   traZODone (DESYREL) 100 MG tablet Take 1 tablet (100 mg total) by mouth at bedtime. 30 tablet 0   valACYclovir (VALTREX) 500 MG tablet Take 500 mg by mouth daily.      Labs  Lab Results:  Admission on 07/25/2022  Component Date Value Ref Range Status   SARS Coronavirus 2 by RT PCR 07/25/2022 NEGATIVE  NEGATIVE Final   Comment: (NOTE) SARS-CoV-2 target nucleic acids are NOT DETECTED.  The SARS-CoV-2 RNA is generally detectable in upper respiratory specimens during the acute phase of infection. The lowest concentration of SARS-CoV-2 viral copies this assay can detect is 138 copies/mL. A negative result does  not preclude SARS-Cov-2 infection and should not be used as the sole basis for treatment or other patient management decisions. A negative result may occur with  improper specimen collection/handling, submission of specimen other than nasopharyngeal swab, presence of viral mutation(s) within the areas targeted by this assay, and inadequate number of viral copies(<138 copies/mL). A negative result must be combined with clinical observations, patient history, and epidemiological information. The expected result is Negative.  Fact Sheet for Patients:  EntrepreneurPulse.com.au  Fact Sheet for Healthcare Providers:  IncredibleEmployment.be  This test is no                          t yet approved or cleared by the [place] FDA and  has been authorized for detection and/or diagnosis of SARS-CoV-2 by FDA under an Emergency Use Authorization (EUA). This EUA will remain  in effect (meaning this test can be used) for the duration of the COVID-19 declaration under Section 564(b)(1) of the Act, 21 U.S.C.section 360bbb-3(b)(1), unless the authorization is terminated  or revoked sooner.       Influenza A by PCR 07/25/2022 NEGATIVE  NEGATIVE Final   Influenza B by PCR 07/25/2022 NEGATIVE  NEGATIVE Final   Comment: (NOTE) The Xpert Xpress SARS-CoV-2/FLU/RSV plus assay is intended as an aid in the diagnosis of influenza from Nasopharyngeal swab specimens and should not be used as a sole basis for treatment. Nasal washings and aspirates are unacceptable for Xpert Xpress SARS-CoV-2/FLU/RSV testing.  Fact Sheet for Patients: EntrepreneurPulse.com.au  Fact Sheet for Healthcare Providers: IncredibleEmployment.be  This test is not yet approved or cleared by the [place] FDA and has been authorized for detection and/or diagnosis of SARS-CoV-2 by FDA under an Emergency Use Authorization (EUA). This EUA will remain in  effect (meaning this test can be used) for the duration of the COVID-19 declaration under Section 564(b)(1) of the Act, 21 U.S.C. section 360bbb-3(b)(1),  unless the authorization is terminated or revoked.  Performed at [facility], [place] [address] [place], [place] [address]    WBC 07/25/2022 8.3  4.0 - 10.5 K/uL Final   RBC 07/25/2022 4.44  3.87 - 5.11 MIL/uL Final   Hemoglobin 07/25/2022 13.7  12.0 - 15.0 g/dL Final   HCT 07/25/2022 40.2  36.0 - 46.0 % Final   MCV 07/25/2022 90.5  80.0 - 100.0 fL Final   MCH 07/25/2022 30.9  26.0 - 34.0 pg Final   MCHC 07/25/2022 34.1  30.0 - 36.0 g/dL Final   RDW 07/25/2022 12.2  11.5 - 15.5 % Final   Platelets 07/25/2022 248  150 - 400 K/uL Final   nRBC 07/25/2022 0.0  0.0 - 0.2 % Final   Neutrophils Relative % 07/25/2022 43  % Final   Neutro Abs 07/25/2022 3.6  1.7 - 7.7 K/uL Final   Lymphocytes Relative 07/25/2022 52  % Final   Lymphs Abs 07/25/2022 4.2 (H)  0.7 - 4.0 K/uL Final   Monocytes Relative 07/25/2022 5  % Final   Monocytes Absolute 07/25/2022 0.4  0.1 - 1.0 K/uL Final   Eosinophils Relative 07/25/2022 0  % Final   Eosinophils Absolute 07/25/2022 0.0  0.0 - 0.5 K/uL Final   Basophils Relative 07/25/2022 0  % Final   Basophils Absolute 07/25/2022 0.0  0.0 - 0.1 K/uL Final   Immature Granulocytes 07/25/2022 0  % Final   Abs Immature Granulocytes 07/25/2022 0.02  0.00 - 0.07 K/uL Final   Performed at [facility], [place] [address], [place], [place] [address]   Sodium 07/25/2022 138  135 - 145 mmol/L Final   Potassium 07/25/2022 4.0  3.5 - 5.1 mmol/L Final   Chloride 07/25/2022 104  98 - 111 mmol/L Final   CO2 07/25/2022 29  22 - 32 mmol/L Final   Glucose, Bld 07/25/2022 83  70 - 99 mg/dL Final   Glucose reference range applies only to samples taken after fasting for at least 8 hours.   BUN 07/25/2022 11  6 - 20 mg/dL Final   Creatinine, Ser 07/25/2022 0.97  0.44 - 1.00 mg/dL Final   Calcium 07/25/2022 9.2  8.9 - 10.3 mg/dL  Final   Total Protein 07/25/2022 7.0  6.5 - 8.1 g/dL Final   Albumin 07/25/2022 3.8  3.5 - 5.0 g/dL Final   AST 07/25/2022 18  15 - 41 U/L Final   ALT 07/25/2022 22  0 - 44 U/L Final   Alkaline Phosphatase 07/25/2022 64  38 - 126 U/L Final   Total Bilirubin 07/25/2022 0.2 (L)  0.3 - 1.2 mg/dL Final   GFR, Estimated 07/25/2022 >60  >60 mL/min Final   Comment: (NOTE) Calculated using the CKD-EPI Creatinine Equation (2021)    Anion gap 07/25/2022 5  5 - 15 Final   Performed at [place] [address], [place], [place] [address]   POC Amphetamine UR 07/25/2022 None Detected  NONE DETECTED (Cut Off Level 1000 ng/mL) Preliminary   POC Secobarbital (BAR) 07/25/2022 None Detected  NONE DETECTED (Cut Off Level 300 ng/mL) Preliminary   POC Buprenorphine (BUP) 07/25/2022 None Detected  NONE DETECTED (Cut Off Level 10 ng/mL) Preliminary   POC Oxazepam (BZO) 07/25/2022 None Detected  NONE DETECTED (Cut Off Level 300 ng/mL) Preliminary   POC Cocaine UR 07/25/2022 None Detected  NONE DETECTED (Cut Off Level 300 ng/mL) Preliminary   POC Methamphetamine UR 07/25/2022 None Detected  NONE DETECTED (Cut Off  Level 1000 ng/mL) Preliminary   POC Morphine 07/25/2022 None Detected  NONE DETECTED (Cut Off Level 300 ng/mL) Preliminary   POC Methadone UR 07/25/2022 None Detected  NONE DETECTED (Cut Off Level 300 ng/mL) Preliminary   POC Oxycodone UR 07/25/2022 None Detected  NONE DETECTED (Cut Off Level 100 ng/mL) Preliminary   POC Marijuana UR 07/25/2022 None Detected  NONE DETECTED (Cut Off Level 50 ng/mL) Preliminary   SARSCOV2ONAVIRUS 2 AG 07/25/2022 NEGATIVE  NEGATIVE Final   Comment: (NOTE) SARS-CoV-2 antigen NOT DETECTED.   Negative results are presumptive.  Negative results do not preclude SARS-CoV-2 infection and should not be used as the sole basis for treatment or other patient management decisions, including infection  control decisions, particularly in the presence of clinical signs and   symptoms consistent with COVID-19, or in those who have been in contact with the virus.  Negative results must be combined with clinical observations, patient history, and epidemiological information. The expected result is Negative.  Fact Sheet for Patients: https://www.jennings-kim.com/  Fact Sheet for Healthcare Providers: https://alexander-rogers.biz/  This test is not yet approved or cleared by the [place] FDA and  has been authorized for detection and/or diagnosis of SARS-CoV-2 by FDA under an Emergency Use Authorization (EUA).  This EUA will remain in effect (meaning this test can be used) for the duration of  the COV                          ID-19 declaration under Section 564(b)(1) of the Act, 21 U.S.C. section 360bbb-3(b)(1), unless the authorization is terminated or revoked sooner.     Cholesterol 07/25/2022 181  0 - 200 mg/dL Final   Triglycerides 08/16/1593 118  <150 mg/dL Final   HDL 58/59/2924 45  >40 mg/dL Final   Total CHOL/HDL Ratio 07/25/2022 4.0  RATIO Final   VLDL 07/25/2022 24  0 - 40 mg/dL Final   LDL Cholesterol 07/25/2022 112 (H)  0 - 99 mg/dL Final   Comment:        Total Cholesterol/HDL:CHD Risk Coronary Heart Disease Risk Table                     Men   Women  1/2 Average Risk   3.4   3.3  Average Risk       5.0   4.4  2 X Average Risk   9.6   7.1  3 X Average Risk  23.4   11.0        Use the calculated Patient Ratio above and the CHD Risk Table to determine the patient's CHD Risk.        ATP III CLASSIFICATION (LDL):  <100     mg/dL   Optimal  462-863  mg/dL   Near or Above                    Optimal  130-159  mg/dL   Borderline  817-711  mg/dL   High  >657     mg/dL   Very High Performed at [facility], [address], [place], [place] [address]    TSH 07/25/2022 6.668 (H)  0.350 - 4.500 uIU/mL Final   Comment: Performed by a 3rd Generation assay with a functional sensitivity of <=0.01  uIU/mL. Performed at [facility], [address], [place], [place] [address]    Glucose-Capillary 07/26/2022 104 (H)  70 - 99 mg/dL Final  Glucose reference range applies only to samples taken after fasting for at least 8 hours.   T3, Free 07/31/2022 2.3  2.0 - 4.4 pg/mL Final   Comment: (NOTE) Performed At: [facility] [place], [place] [id number] Rush Farmer MD [id number]    Free T4 07/31/2022 0.60 (L)  0.61 - 1.12 ng/dL Final   Comment: (NOTE) Biotin ingestion may interfere with free T4 tests. If the results are inconsistent with the TSH level, previous test results, or the clinical presentation, then consider biotin interference. If needed, order repeat testing after stopping biotin. Performed at [facility], [place] [address] [place], [place] [address]    Glucose-Capillary 08/29/2022 100 (H)  70 - 99 mg/dL Final   Glucose reference range applies only to samples taken after fasting for at least 8 hours.   TSH 09/05/2022 0.793  0.350 - 4.500 uIU/mL Final   Comment: Performed by a 3rd Generation assay with a functional sensitivity of <=0.01 uIU/mL. Performed at [facility], [place] [address] [place], [place] [address]    Free T4 09/05/2022 0.73  0.61 - 1.12 ng/dL Final   Comment: (NOTE) Biotin ingestion may interfere with free T4 tests. If the results are inconsistent with the TSH level, previous test results, or the clinical presentation, then consider biotin interference. If needed, order repeat testing after stopping biotin. Performed at [facility], [place] [address], [place], [place] [address]    Preg Test, Ur 09/06/2022 Negative  Negative Final   Preg Test, Ur 09/06/2022 NEGATIVE  NEGATIVE Final   Comment:        THE SENSITIVITY OF THIS METHODOLOGY IS >24 mIU/mL    Preg Test, Ur 09/05/2022 NEGATIVE  NEGATIVE Final   Comment:        THE SENSITIVITY OF THIS METHODOLOGY IS >24 mIU/mL    Sodium 09/13/2022 136  135 -  145 mmol/L Final   Potassium 09/13/2022 4.5  3.5 - 5.1 mmol/L Final   Chloride 09/13/2022 105  98 - 111 mmol/L Final   CO2 09/13/2022 21 (L)  22 - 32 mmol/L Final   Glucose, Bld 09/13/2022 111 (H)  70 - 99 mg/dL Final   Glucose reference range applies only to samples taken after fasting for at least 8 hours.   BUN 09/13/2022 14  6 - 20 mg/dL Final   Creatinine, Ser 09/13/2022 0.92  0.44 - 1.00 mg/dL Final   Calcium 09/13/2022 9.1  8.9 - 10.3 mg/dL Final   GFR, Estimated 09/13/2022 >60  >60 mL/min Final   Comment: (NOTE) Calculated using the CKD-EPI Creatinine Equation (2021)    Anion gap 09/13/2022 10  5 - 15 Final   Performed at [facility], [place] [address] [place], [place] [address]   Vitamin B-12 09/13/2022 585  180 - 914 pg/mL Final   Comment: (NOTE) This assay is not validated for testing neonatal or myeloproliferative syndrome specimens for Vitamin B12 levels. Performed at [facility], [place] [address], [place], [place] [address]    Color, Urine 09/14/2022 YELLOW  YELLOW Final   APPearance 09/14/2022 HAZY (A)  CLEAR Final   Specific Gravity, Urine 09/14/2022 1.025  1.005 - 1.030 Final   pH 09/14/2022 5.0  5.0 - 8.0 Final   Glucose, UA 09/14/2022 NEGATIVE  NEGATIVE mg/dL Final   Hgb urine dipstick 09/14/2022 NEGATIVE  NEGATIVE Final   Bilirubin Urine 09/14/2022 NEGATIVE  NEGATIVE Final   Ketones, ur 09/14/2022 NEGATIVE  NEGATIVE mg/dL  Final   Protein, ur 09/14/2022 NEGATIVE  NEGATIVE mg/dL Final   Nitrite 09/14/2022 NEGATIVE  NEGATIVE Final   Leukocytes,Ua 09/14/2022 TRACE (A)  NEGATIVE Final   RBC / HPF 09/14/2022 0-5  0 - 5 RBC/hpf Final   WBC, UA 09/14/2022 0-5  0 - 5 WBC/hpf Final   Bacteria, UA 09/14/2022 RARE (A)  NONE SEEN Final   Squamous Epithelial / LPF 09/14/2022 0-5  0 - 5 Final   Mucus 09/14/2022 PRESENT   Final   Performed at [facility], [place] [address], [place], [place] [address]   Glucose-Capillary 09/16/2022 105 (H)  70 - 99 mg/dL Final    Glucose reference range applies only to samples taken after fasting for at least 8 hours.  Admission on 07/04/2022, Discharged on 07/05/2022  Component Date Value Ref Range Status   Sodium 07/04/2022 142  135 - 145 mmol/L Final   Potassium 07/04/2022 4.4  3.5 - 5.1 mmol/L Final   Chloride 07/04/2022 109  98 - 111 mmol/L Final   CO2 07/04/2022 26  22 - 32 mmol/L Final   Glucose, Bld 07/04/2022 96  70 - 99 mg/dL Final   Glucose reference range applies only to samples taken after fasting for at least 8 hours.   BUN 07/04/2022 15  6 - 20 mg/dL Final   Creatinine, Ser 07/04/2022 0.83  0.44 - 1.00 mg/dL Final   Calcium 07/04/2022 9.3  8.9 - 10.3 mg/dL Final   Total Protein 07/04/2022 7.2  6.5 - 8.1 g/dL Final   Albumin 07/04/2022 3.9  3.5 - 5.0 g/dL Final   AST 07/04/2022 23  15 - 41 U/L Final   ALT 07/04/2022 28  0 - 44 U/L Final   Alkaline Phosphatase 07/04/2022 77  38 - 126 U/L Final   Total Bilirubin 07/04/2022 0.4  0.3 - 1.2 mg/dL Final   GFR, Estimated 07/04/2022 >60  >60 mL/min Final   Comment: (NOTE) Calculated using the CKD-EPI Creatinine Equation (2021)    Anion gap 07/04/2022 7  5 - 15 Final   Performed at [facility], [place] [address], [place], [place] [address]   Alcohol, Ethyl (B) 07/04/2022 <10  <10 mg/dL Final   Comment: (NOTE) Lowest detectable limit for serum alcohol is 10 mg/dL.  For medical purposes only. Performed at [facility], [place] [address] [place], [place] [address]    WBC 07/04/2022 7.0  4.0 - 10.5 K/uL Final   RBC 07/04/2022 4.18  3.87 - 5.11 MIL/uL Final   Hemoglobin 07/04/2022 12.8  12.0 - 15.0 g/dL Final   HCT 07/04/2022 39.1  36.0 - 46.0 % Final   MCV 07/04/2022 93.5  80.0 - 100.0 fL Final   MCH 07/04/2022 30.6  26.0 - 34.0 pg Final   MCHC 07/04/2022 32.7  30.0 - 36.0 g/dL Final   RDW 07/04/2022 12.9  11.5 - 15.5 % Final   Platelets 07/04/2022 243  150 - 400 K/uL Final   nRBC 07/04/2022 0.0  0.0 - 0.2 % Final    Neutrophils Relative % 07/04/2022 43  % Final   Neutro Abs 07/04/2022 3.0  1.7 - 7.7 K/uL Final   Lymphocytes Relative 07/04/2022 50  % Final   Lymphs Abs 07/04/2022 3.5  0.7 - 4.0 K/uL Final   Monocytes Relative 07/04/2022 7  % Final   Monocytes Absolute 07/04/2022 0.5  0.1 - 1.0 K/uL Final   Eosinophils Relative 07/04/2022 0  % Final   Eosinophils Absolute 07/04/2022  0.0  0.0 - 0.5 K/uL Final   Basophils Relative 07/04/2022 0  % Final   Basophils Absolute 07/04/2022 0.0  0.0 - 0.1 K/uL Final   Immature Granulocytes 07/04/2022 0  % Final   Abs Immature Granulocytes 07/04/2022 0.01  0.00 - 0.07 K/uL Final   Performed at [facility], [place] [address], [place], [place] [address]   I-stat hCG, quantitative 07/04/2022 <5.0  <5 mIU/mL Final   Comment 3 07/04/2022          Final   Comment:   GEST. AGE      CONC.  (mIU/mL)   <=1 WEEK        5 - 50     2 WEEKS       50 - 500     3 WEEKS       100 - 10,000     4 WEEKS     1,000 - 30,000        FEMALE AND NON-PREGNANT FEMALE:     LESS THAN 5 mIU/mL   Admission on 06/14/2022, Discharged on 06/14/2022  Component Date Value Ref Range Status   Color, UA 06/14/2022 yellow  yellow Final   Clarity, UA 06/14/2022 cloudy (A)  clear Final   Glucose, UA 06/14/2022 negative  negative mg/dL Final   Bilirubin, UA 06/14/2022 negative  negative Final   Ketones, POC UA 06/14/2022 negative  negative mg/dL Final   Spec Grav, UA 06/14/2022 1.025  1.010 - 1.025 Final   Blood, UA 06/14/2022 negative  negative Final   pH, UA 06/14/2022 7.5  5.0 - 8.0 Final   Protein Ur, POC 06/14/2022 =30 (A)  negative mg/dL Final   Urobilinogen, UA 06/14/2022 0.2  0.2 or 1.0 E.U./dL Final   Nitrite, UA 06/14/2022 Negative  Negative Final   Leukocytes, UA 06/14/2022 Negative  Negative Final   Preg Test, Ur 06/14/2022 Negative  Negative Final   Specimen Description 06/14/2022 URINE, CLEAN CATCH   Final   Special Requests 06/14/2022    Final                    Value:NONE Performed at [facility], [place] [address] [place], [place] [address]    Culture 06/14/2022 MULTIPLE SPECIES PRESENT, SUGGEST RECOLLECTION (A)   Final   Report Status 06/14/2022 06/16/2022 FINAL   Final  Admission on 06/07/2022, Discharged on 06/10/2022  Component Date Value Ref Range Status   SARS Coronavirus 2 by RT PCR 06/07/2022 NEGATIVE  NEGATIVE Final   Comment: (NOTE) SARS-CoV-2 target nucleic acids are NOT DETECTED.  The SARS-CoV-2 RNA is generally detectable in upper respiratory specimens during the acute phase of infection. The lowest concentration of SARS-CoV-2 viral copies this assay can detect is 138 copies/mL. A negative result does not preclude SARS-Cov-2 infection and should not be used as the sole basis for treatment or other patient management decisions. A negative result may occur with  improper specimen collection/handling, submission of specimen other than nasopharyngeal swab, presence of viral mutation(s) within the areas targeted by this assay, and inadequate number of viral copies(<138 copies/mL). A negative result must be combined with clinical observations, patient history, and epidemiological information. The expected result is Negative.  Fact Sheet for Patients:  EntrepreneurPulse.com.au  Fact Sheet for Healthcare Providers:  IncredibleEmployment.be  This test is no                          t  yet approved or cleared by the [place] and  has been authorized for detection and/or diagnosis of SARS-CoV-2 by FDA under an Emergency Use Authorization (EUA). This EUA will remain  in effect (meaning this test can be used) for the duration of the COVID-19 declaration under Section 564(b)(1) of the Act, 21 U.S.C.section 360bbb-3(b)(1), unless the authorization is terminated  or revoked sooner.       Influenza A by PCR 06/07/2022 NEGATIVE  NEGATIVE Final   Influenza B by PCR 06/07/2022 NEGATIVE   NEGATIVE Final   Comment: (NOTE) The Xpert Xpress SARS-CoV-2/FLU/RSV plus assay is intended as an aid in the diagnosis of influenza from Nasopharyngeal swab specimens and should not be used as a sole basis for treatment. Nasal washings and aspirates are unacceptable for Xpert Xpress SARS-CoV-2/FLU/RSV testing.  Fact Sheet for Patients: EntrepreneurPulse.com.au  Fact Sheet for Healthcare Providers: IncredibleEmployment.be  This test is not yet approved or cleared by the [place] FDA and has been authorized for detection and/or diagnosis of SARS-CoV-2 by FDA under an Emergency Use Authorization (EUA). This EUA will remain in effect (meaning this test can be used) for the duration of the COVID-19 declaration under Section 564(b)(1) of the Act, 21 U.S.C. section 360bbb-3(b)(1), unless the authorization is terminated or revoked.  Performed at [facility], [place] [address] [place], [place] [address]    WBC 06/07/2022 5.7  4.0 - 10.5 K/uL Final   RBC 06/07/2022 4.39  3.87 - 5.11 MIL/uL Final   Hemoglobin 06/07/2022 13.2  12.0 - 15.0 g/dL Final   HCT 06/07/2022 40.4  36.0 - 46.0 % Final   MCV 06/07/2022 92.0  80.0 - 100.0 fL Final   MCH 06/07/2022 30.1  26.0 - 34.0 pg Final   MCHC 06/07/2022 32.7  30.0 - 36.0 g/dL Final   RDW 06/07/2022 12.4  11.5 - 15.5 % Final   Platelets 06/07/2022 308  150 - 400 K/uL Final   nRBC 06/07/2022 0.0  0.0 - 0.2 % Final   Neutrophils Relative % 06/07/2022 42  % Final   Neutro Abs 06/07/2022 2.4  1.7 - 7.7 K/uL Final   Lymphocytes Relative 06/07/2022 54  % Final   Lymphs Abs 06/07/2022 3.1  0.7 - 4.0 K/uL Final   Monocytes Relative 06/07/2022 4  % Final   Monocytes Absolute 06/07/2022 0.2  0.1 - 1.0 K/uL Final   Eosinophils Relative 06/07/2022 0  % Final   Eosinophils Absolute 06/07/2022 0.0  0.0 - 0.5 K/uL Final   Basophils Relative 06/07/2022 0  % Final   Basophils Absolute 06/07/2022 0.0  0.0 - 0.1 K/uL  Final   Immature Granulocytes 06/07/2022 0  % Final   Abs Immature Granulocytes 06/07/2022 0.01  0.00 - 0.07 K/uL Final   Performed at [facility], [place] [address], [place], [place] [address]   Sodium 06/07/2022 139  135 - 145 mmol/L Final   Potassium 06/07/2022 4.0  3.5 - 5.1 mmol/L Final   Chloride 06/07/2022 104  98 - 111 mmol/L Final   CO2 06/07/2022 28  22 - 32 mmol/L Final   Glucose, Bld 06/07/2022 104 (H)  70 - 99 mg/dL Final   Glucose reference range applies only to samples taken after fasting for at least 8 hours.   BUN 06/07/2022 8  6 - 20 mg/dL Final   Creatinine, Ser 06/07/2022 0.84  0.44 - 1.00 mg/dL Final   Calcium 06/07/2022 9.1  8.9 - 10.3 mg/dL Final   Total  Protein 06/07/2022 6.9  6.5 - 8.1 g/dL Final   Albumin 13/05/6577 3.7  3.5 - 5.0 g/dL Final   AST 46/96/2952 19  15 - 41 U/L Final   ALT 06/07/2022 22  0 - 44 U/L Final   Alkaline Phosphatase 06/07/2022 54  38 - 126 U/L Final   Total Bilirubin 06/07/2022 0.4  0.3 - 1.2 mg/dL Final   GFR, Estimated 06/07/2022 >60  >60 mL/min Final   Comment: (NOTE) Calculated using the CKD-EPI Creatinine Equation (2021)    Anion gap 06/07/2022 7  5 - 15 Final   Performed at [facility], [address], [place], [place] [address]   Hgb A1c MFr Bld 06/07/2022 5.0  4.8 - 5.6 % Final   Comment: (NOTE) Pre diabetes:          5.7%-6.4%  Diabetes:              >6.4%  Glycemic control for   <7.0% adults with diabetes    Mean Plasma Glucose 06/07/2022 96.8  mg/dL Final   Performed at [facility], [address] [place], [place] [address]   TSH 06/07/2022 1.620  0.350 - 4.500 uIU/mL Final   Comment: Performed by a 3rd Generation assay with a functional sensitivity of <=0.01 uIU/mL. Performed at [facility], [address] [place], [place] [address]    RPR Ser Ql 06/07/2022 NON REACTIVE  NON REACTIVE Final   Performed at [facility], [address] [place], [place] [address]   Color, Urine  06/07/2022 YELLOW  YELLOW Final   APPearance 06/07/2022 HAZY (A)  CLEAR Final   Specific Gravity, Urine 06/07/2022 1.018  1.005 - 1.030 Final   pH 06/07/2022 7.0  5.0 - 8.0 Final   Glucose, UA 06/07/2022 NEGATIVE  NEGATIVE mg/dL Final   Hgb urine dipstick 06/07/2022 NEGATIVE  NEGATIVE Final   Bilirubin Urine 06/07/2022 NEGATIVE  NEGATIVE Final   Ketones, ur 06/07/2022 NEGATIVE  NEGATIVE mg/dL Final   Protein, ur 34/74/2595 NEGATIVE  NEGATIVE mg/dL Final   Nitrite 63/87/5643 NEGATIVE  NEGATIVE Final   Leukocytes,Ua 06/07/2022 NEGATIVE  NEGATIVE Final   Performed at [facility], [address] [place], [place] [address]   Cholesterol 06/07/2022 164  0 - 200 mg/dL Final   Triglycerides 88/41/6606 158 (H)  <150 mg/dL Final   HDL 30/16/0109 44  >40 mg/dL Final   Total CHOL/HDL Ratio 06/07/2022 3.7  RATIO Final   VLDL 06/07/2022 32  0 - 40 mg/dL Final   LDL Cholesterol 06/07/2022 88  0 - 99 mg/dL Final   Comment:        Total Cholesterol/HDL:CHD Risk Coronary Heart Disease Risk Table                     Men   Women  1/2 Average Risk   3.4   3.3  Average Risk       5.0   4.4  2 X Average Risk   9.6   7.1  3 X Average Risk  23.4   11.0        Use the calculated Patient Ratio above and the CHD Risk Table to determine the patient's CHD Risk.        ATP III CLASSIFICATION (LDL):  <100     mg/dL   Optimal  323-557  mg/dL   Near or Above  Optimal  130-159  mg/dL   Borderline  903-009  mg/dL   High  >233     mg/dL   Very High Performed at [facility], [address], [place], [place] [address]    HIV Screen 4th Generation wRfx 06/07/2022 Non Reactive  Non Reactive Final   Performed at [facility], [address], [place], [place] [address]   SARSCOV2ONAVIRUS 2 AG 06/07/2022 NEGATIVE  NEGATIVE Final   Comment: (NOTE) SARS-CoV-2 antigen NOT DETECTED.   Negative results are presumptive.  Negative results do not preclude SARS-CoV-2 infection and  should not be used as the sole basis for treatment or other patient management decisions, including infection  control decisions, particularly in the presence of clinical signs and  symptoms consistent with COVID-19, or in those who have been in contact with the virus.  Negative results must be combined with clinical observations, patient history, and epidemiological information. The expected result is Negative.  Fact Sheet for Patients: https://www.jennings-kim.com/  Fact Sheet for Healthcare Providers: https://alexander-rogers.biz/  This test is not yet approved or cleared by the [place] FDA and  has been authorized for detection and/or diagnosis of SARS-CoV-2 by FDA under an Emergency Use Authorization (EUA).  This EUA will remain in effect (meaning this test can be used) for the duration of  the COV                          ID-19 declaration under Section 564(b)(1) of the Act, 21 U.S.C. section 360bbb-3(b)(1), unless the authorization is terminated or revoked sooner.     POC Amphetamine UR 06/07/2022 None Detected  NONE DETECTED (Cut Off Level 1000 ng/mL) Final   POC Secobarbital (BAR) 06/07/2022 None Detected  NONE DETECTED (Cut Off Level 300 ng/mL) Final   POC Buprenorphine (BUP) 06/07/2022 None Detected  NONE DETECTED (Cut Off Level 10 ng/mL) Final   POC Oxazepam (BZO) 06/07/2022 None Detected  NONE DETECTED (Cut Off Level 300 ng/mL) Final   POC Cocaine UR 06/07/2022 None Detected  NONE DETECTED (Cut Off Level 300 ng/mL) Final   POC Methamphetamine UR 06/07/2022 None Detected  NONE DETECTED (Cut Off Level 1000 ng/mL) Final   POC Morphine 06/07/2022 None Detected  NONE DETECTED (Cut Off Level 300 ng/mL) Final   POC Methadone UR 06/07/2022 None Detected  NONE DETECTED (Cut Off Level 300 ng/mL) Final   POC Oxycodone UR 06/07/2022 None Detected  NONE DETECTED (Cut Off Level 100 ng/mL) Final   POC Marijuana UR 06/07/2022 None Detected  NONE DETECTED  (Cut Off Level 50 ng/mL) Final   Preg Test, Ur 06/08/2022 NEGATIVE  NEGATIVE Final   Comment:        THE SENSITIVITY OF THIS METHODOLOGY IS >20 mIU/mL. Performed at [facility], [address], [place], [place] [address]    Preg Test, Ur 06/08/2022 NEGATIVE  NEGATIVE Final   Comment:        THE SENSITIVITY OF THIS METHODOLOGY IS >24 mIU/mL     Blood Alcohol level:  Lab Results  Component Value Date   ETH <10 07/04/2022   ETH <10 02/07/2022    Metabolic Disorder Labs: Lab Results  Component Value Date   HGBA1C 5.0 06/07/2022   MPG 96.8 06/07/2022   MPG 96.8 02/07/2022   No results found for: "PROLACTIN" Lab Results  Component Value Date   CHOL 181 07/25/2022   TRIG 118 07/25/2022   HDL 45 07/25/2022  CHOLHDL 4.0 07/25/2022   VLDL 24 07/25/2022   LDLCALC 112 (H) 07/25/2022   LDLCALC 88 06/07/2022    Therapeutic Lab Levels: No results found for: "LITHIUM" No results found for: "VALPROATE" No results found for: "CBMZ"  Physical Findings   GAD-7    Flowsheet Row Office Visit from 12/17/2021 in [place]  Total GAD-7 Score 17      PHQ2-9    [place] [facility] from 06/07/2022 in [facility] Office Visit from 12/17/2021 in [place]  PHQ-2 Total Score 1 2  PHQ-9 Total Score 2 8      Flowsheet Row ED from 07/25/2022 in [facility] ED from 07/04/2022 in [place] [facility] ED from 06/14/2022 in [facility] [place] CATEGORY High Risk No Risk No Risk        Musculoskeletal  Strength & Muscle Tone: within normal limits Gait & Station: normal Patient leans: N/A  Psychiatric Specialty Exam  Presentation  General Appearance:  Appropriate for Environment  Eye Contact: Fair  Speech: Clear and Coherent  Speech Volume: Normal  Handedness: Right   Mood and Affect   Mood: Dysphoric  Affect: Congruent   Thought Process  Thought Processes: Coherent  Descriptions of Associations:Intact  Orientation:Full (Time, Place and Person)  Thought Content:Logical  Diagnosis of Schizophrenia or Schizoaffective disorder in past: Yes  Duration of Psychotic Symptoms: Greater than six months   Hallucinations:Hallucinations: None  Ideas of Reference:None  Suicidal Thoughts:Suicidal Thoughts: No  Homicidal Thoughts:Homicidal Thoughts: No   Sensorium  Memory: Immediate Fair; Recent Fair; Remote Fair  Judgment: Fair  Insight: Fair   Community education officer  Concentration: Fair  Attention Span: Fair  Recall: [employer] of Knowledge: Fair  Language: Fair   Psychomotor Activity  Psychomotor Activity: Psychomotor Activity: Normal   Assets  Assets: Communication Skills; Desire for Improvement; Financial Resources/Insurance   Sleep  Sleep: Sleep: Fair   No data recorded  Physical Exam  Physical Exam HENT:     Head: Normocephalic.     Nose: Nose normal.  Eyes:     Conjunctiva/sclera: Conjunctivae normal.  Cardiovascular:     Rate and Rhythm: Normal rate.  Pulmonary:     Effort: Pulmonary effort is normal.  Musculoskeletal:     Cervical back: Normal range of motion.  Neurological:     Mental Status: She is alert and oriented to person, place, and time.    Review of Systems  Constitutional: Negative.   HENT: Negative.    Eyes: Negative.   Respiratory: Negative.    Cardiovascular: Negative.   Gastrointestinal: Negative.   Genitourinary: Negative.   Musculoskeletal: Negative.   Neurological: Negative.   Endo/Heme/Allergies: Negative.    Blood pressure 105/70, pulse 99, temperature 97.9 F (36.6 C), temperature source Oral, resp. rate 18, SpO2 98 %. There is no height or weight on file to calculate BMI.  Treatment Plan Summary: Disposition-Patient remains cleared by psychiatry. Transition of care team is  pursuing appropriate placement.    Labs reviewed.  No new lab results.    Vital signs reviewed are WNL.   Current medication regimen. No medication changes on 09/20/22.  ARIPiprazole ER  400 mg Intramuscular Q28 days   docusate sodium  100 mg Oral Daily   levothyroxine  100 mcg Oral Q0600   loratadine  10 mg Oral Daily   metFORMIN  500 mg Oral  Q breakfast   nicotine  14 mg Transdermal Q0600   Oxcarbazepine  300 mg Oral BID   pantoprazole  20 mg Oral Daily   QUEtiapine  400 mg Oral BID   sertraline  150 mg Oral Daily   traZODone  100 mg Oral QHS   valACYclovir  500 mg Oral Daily    Makela Niehoff L, NP 09/20/2022 8:59 AM

## 2022-09-20 NOTE — ED Notes (Signed)
Patient has been fixated on her clothing and electronics left behind at the the group home. Called patients father to discuss pickup of items. Father said he will try to workout retrieval of the items. However the father advised that the group home has a bed bug infestation and to be cautious of any belonging brought to the [facility].

## 2022-09-20 NOTE — ED Notes (Signed)
Received patient this AM. Patient in her bed sleeping. Patient respirations are even and unlabored. Will continue to monitor for safety. 

## 2022-09-20 NOTE — ED Notes (Signed)
Pt was given [employer] for lunch

## 2022-09-20 NOTE — Progress Notes (Signed)
[name] Progress Note   16 - [name] spoke with pt's father, [name], [phone], to inquire about pt's last primary care provider an if he received the email with the consent for disclosure for [facility].  He stated he would get his wife to sign the paperwork, and she will email it back to the [name].  He stated that he attempted to get records from [facility] in-person but was informed by the front desk clerk that Ms. Elmarie Mainland, pt's Care Coordinator, would have access to that already since she is able to get into their database system.  [name] inquired pt on the name of her last primary care provider.  [name] provided this information along with the group homes that have been contacted thus far to [name] as requested by her.    For the record, Dr. Netta Corrigan @ [facility], [phone] was her previous PCP.  Ms. Erline Hau from [facility] came for an initial visit to meet the pt.  She will be taking over the pt's case at DSS in place of [place].  Her contact information in [phone] office, [phone] mobile.   Hansel Starling, MSW, [name] [facility] [phone] phone [phone] fax

## 2022-09-20 NOTE — ED Notes (Signed)
Pt sleeping at present, no distress noted.  Monitoring for safety. 

## 2022-09-20 NOTE — ED Notes (Signed)
Pt A&O x 4, resting at present, no distress noted, cooperative and anxious at present.  Monitoring for safety.

## 2022-09-20 NOTE — ED Notes (Signed)
Patient AOx4, Up and about. Not in any acute distress. Will continue to monitor for safety

## 2022-09-20 NOTE — ED Notes (Signed)
[name]&Ox4.

## 2022-09-21 MED ORDER — SENNA 8.6 MG PO TABS
1.0000 | ORAL_TABLET | Freq: Once | ORAL | Status: AC
  Administered 2022-09-21: 8.6 mg via ORAL
  Filled 2022-09-21: qty 1

## 2022-09-21 MED ORDER — MAGNESIUM HYDROXIDE 400 MG/5ML PO SUSP: 30.0000 mL | Freq: Every day | ORAL | Status: DC | PRN

## 2022-09-21 NOTE — ED Notes (Signed)
Provider is at bedside.

## 2022-09-21 NOTE — ED Notes (Signed)
Patient  denies any shortness of breath . Breathing non labored . Provider states to encopurage fluids. Will continue to monitor.

## 2022-09-21 NOTE — ED Notes (Signed)
Patient observed/assessed in bed/chair resting quietly appearing in no distress and verbalizing no complaints at this time. Will continue to monitor.  

## 2022-09-21 NOTE — ED Notes (Signed)
[name]&o x3. Denies SI/HI/AVH,Denies intent or plan  to harm self or others. Routine conducted according to faculty protocol .Encourge patient to notify  staff with any needs or concerns. Patient verbalized agreement and understanding.Will continue to monitor for safety. Patient is watching TV.

## 2022-09-21 NOTE — ED Notes (Signed)
Patient  sleeping in no acute stress. RR even and unlabored .Environment secured .Will continue to monitor for safely. 

## 2022-09-21 NOTE — ED Notes (Signed)
[name]&o x3. Denies SI/HI/ AVH .Denies intent or plan  to harm self or others. Routine conducted according to faculty protocol .Encourge patient to notify  staff with any needs or concerns. Patient verbalized agreement and understanding . Patient is currently reading. Will continue to monitor for safety.

## 2022-09-21 NOTE — ED Provider Notes (Signed)
Assessed pt after being notified by RN that pt has complaint of throat hurting and vomited up brown fluid with a little bit of red in it.   Pt is a&ox3, in no acute distress, non-toxic appearing. She reports 1 episode of "brownish red" emesis earlier today, which she believes is oatmeal. Emesis was unwitnessed. Currently, she denies any physical complaints. She reports she is menstruating and that she "sometimes" does experience nausea/vomiting during menstruation. Throat visualized, no bleeding or drainage. No tenderness to palpation of abdomen. Normoactive bowel sounds in all 4 quadrants. Encouraged pt to maintain hydration with po fluids. Discussed should pt have another episode of emesis to alert staff so that staff can see the emesis. Pt verbalized understanding.

## 2022-09-21 NOTE — ED Notes (Signed)
Patient reports that her throat is hurting. States that she vomited up brown fluid with a ting of red in it. Patient did not save the fluids so Rn was unable to see it. Informed patient to save the fluid next time it happensso Rn can see it. Patient verbalized understanding. Patient is resting breathing non labored .Notified the provider.Patient vitals are 112/54 bp pulse 119 ,temp 98.5, 100 % Oxygen room air . Will continue to monitor.

## 2022-09-21 NOTE — ED Notes (Signed)
[name]&o x3. Denies SI/HI/AVH.Denies intent or plan  to harm self or others. Routine conducted according to faculty protocol .Encourge patient to notify  staff with any needs or concerns. Patient verbalized agreement and understanding . Patient is currently watching TV. Will continue to monitor for safety. 

## 2022-09-21 NOTE — ED Notes (Signed)
[name]&o x3. Denies SI/HI/AVH..Denies intent or plan  to harm self or others. Routine conducted according to faculty protocol .Encourge patient to notify  staff with any needs or concerns. Patient verbalized agreement and understanding .Will continue to monitor for safety. 

## 2022-09-21 NOTE — ED Notes (Signed)
Patient observed/assessed on dayroom floor watching TV. Patient alert and oriented x 4. Affect is flat. Patient denies pain and anxiety. She denies A/V/H. He denies having any thoughts/plan of self harm and harm towards others. Fluid and snack offered. Patient states that appetite has been good throughout the day. Last BM was 09/20/22. Verbalizes no further complaints at this time. Will continue to monitor and support.

## 2022-09-21 NOTE — ED Provider Notes (Signed)
Behavioral Health Progress Note  Date and Time: 09/21/2022 11:28 AM Name: Nicole [name] MRN:  [id number]  Subjective:   Nicole [name] is a 31 y.o. female, with PMH of SCZ unspecified, MDD with psychosis, who presented voluntary to [facility] (07/25/2022) via [facility] after a verbal altercation with [name] (not patient's legal guardian) for transient SI.  This is patient's fourth visit to [facility] for similar concerns this year. Patient has been dismissed from previous group home due to threatening SI and HI, then leaving the group home.   Legal Guardian: Mom Nicole [name]) transitioning to be Dad Nicole [name]) Point of contact: Dad Nicole [name])  On reassessment, pt is lying down. She endorses "tired" mood. She denies depression, anxiety, euphoria, other mood disturbances. She denies suicidal, homicidal, or violent ideation. She denies auditory visual hallucinations or paranoia. She is hoping placement will be found soon. Review of nursing notes, no behavioral disturbances noted. Pt remains psychiatrically cleared. Disposition pending placement.   Diagnosis:  Final diagnoses:  Severe episode of recurrent major depressive disorder, with psychotic features (HCC)    Total Time spent with patient: 15 minutes  Past Psychiatric History: see HPI Past Medical History:  Past Medical History:  Diagnosis Date   Anxiety    Depression    Hypothyroidism 08/07/2022   Tobacco use disorder 08/07/2022    Past Surgical History:  Procedure Laterality Date   WISDOM TOOTH EXTRACTION Bilateral 2020   Family History:  Family History  Problem Relation Age of Onset   Hypertension Father    Diabetes Father    Family Psychiatric  History: None reported Social History:  Social History   Substance and Sexual Activity  Alcohol Use Never     Social History   Substance and Sexual Activity  Drug Use Never    Social History   Socioeconomic History   Marital status: Single    Spouse name: Not on file    Number of children: Not on file   Years of education: Not on file   Highest education level: Not on file  Occupational History   Not on file  Tobacco Use   Smoking status: Every Day    Types: Cigarettes   Smokeless tobacco: Never  Vaping Use   Vaping Use: Some days  Substance and Sexual Activity   Alcohol use: Never   Drug use: Never   Sexual activity: Yes    Partners: Female    Birth control/protection: Implant  Other Topics Concern   Not on file  Social History Narrative   Not on file   Social Determinants of Health   Financial Resource Strain: Not on file  Food Insecurity: Not on file  Transportation Needs: Not on file  Physical Activity: Not on file  Stress: Not on file  Social Connections: Not on file   SDOH:  SDOH Screenings   Depression (PHQ2-9): Low Risk  (06/10/2022)  Tobacco Use: High Risk (09/18/2022)   Additional Social History:    Pain Medications: SEE MAR Prescriptions: SEE MAR Over the Counter: SEE MAR History of alcohol / drug use?: Yes Longest period of sobriety (when/how long): N/A Negative Consequences of Use:  (NONE) Withdrawal Symptoms: None Name of Substance 1: ETOH IN PAST--PT CURRENTLY DENIES 1 - Frequency: SOCIAL USE ETOH IN PAST 1 - Method of Aquiring: LEGAL 1- Route of Use: ORAL DRINK Name of Substance 2: NICOTINE-VAPE AND CIGARETTES 2 - Amount (size/oz): LESS THAN ONE PACK 2 - Frequency: DAILY 2 - Method of Aquiring: LEGAL 2 -  Route of Substance Use: ORAL SMOKE                Sleep: Good  Appetite:  Good  Current Medications:  Current Facility-Administered Medications  Medication Dose Route Frequency Provider Last Rate Last Admin   acetaminophen (TYLENOL) tablet 650 mg  650 mg Oral Q6H PRN Onuoha, Chinwendu V, NP   650 mg at 09/16/22 1619   alum & mag hydroxide-simeth (MAALOX/MYLANTA) 200-200-20 MG/5ML suspension 30 mL  30 mL Oral Q4H PRN Onuoha, Chinwendu V, NP   30 mL at 07/27/22 2151   ARIPiprazole ER (ABILIFY  MAINTENA) injection 400 mg  400 mg Intramuscular Q28 days Princess Bruins, DO   400 mg at 08/30/22 1338   docusate sodium (COLACE) capsule 100 mg  100 mg Oral Daily [name], MD   100 mg at 09/21/22 0900   hydrOXYzine (ATARAX) tablet 25 mg  25 mg Oral TID PRN Onuoha, Chinwendu V, NP   25 mg at 09/20/22 2115   ibuprofen (ADVIL) tablet 400 mg  400 mg Oral Q6H PRN Onuoha, Chinwendu V, NP   400 mg at 09/20/22 2115   levothyroxine (SYNTHROID) tablet 100 mcg  100 mcg Oral Q0600 Princess Bruins, DO   100 mcg at 09/21/22 0630   loratadine (CLARITIN) tablet 10 mg  10 mg Oral Daily Rankin, Shuvon B, NP   10 mg at 09/21/22 0900   metFORMIN (GLUCOPHAGE) tablet 500 mg  500 mg Oral Q breakfast Park Pope, MD   500 mg at 09/21/22 0900   nicotine (NICODERM CQ - dosed in mg/24 hours) patch 14 mg  14 mg Transdermal Q0600 Onuoha, Chinwendu V, NP   14 mg at 09/21/22 0900   ondansetron (ZOFRAN-ODT) disintegrating tablet 4 mg  4 mg Oral Q8H PRN Sindy Guadeloupe, NP   4 mg at 09/14/22 2351   Oxcarbazepine (TRILEPTAL) tablet 300 mg  300 mg Oral BID Onuoha, Chinwendu V, NP   300 mg at 09/21/22 0900   pantoprazole (PROTONIX) EC tablet 20 mg  20 mg Oral Daily Princess Bruins, DO   20 mg at 09/21/22 0900   QUEtiapine (SEROQUEL) tablet 400 mg  400 mg Oral BID Onuoha, Chinwendu V, NP   400 mg at 09/21/22 0900   sertraline (ZOLOFT) tablet 150 mg  150 mg Oral Daily Onuoha, Chinwendu V, NP   150 mg at 09/21/22 0900   traZODone (DESYREL) tablet 100 mg  100 mg Oral QHS Vernard Gambles H, NP   100 mg at 09/20/22 2115   valACYclovir (VALTREX) tablet 500 mg  500 mg Oral Daily Princess Bruins, DO   500 mg at 09/21/22 0900   ziprasidone (GEODON) injection 20 mg  20 mg Intramuscular Q12H PRN Onuoha, Chinwendu V, NP   20 mg at 09/19/22 2133   Current Outpatient Medications  Medication Sig Dispense Refill   ABILIFY MAINTENA 400 MG PRSY prefilled syringe 400 mg every 28 (twenty-eight) days.     cetirizine (ZYRTEC) 10 MG tablet Take 10 mg by  mouth daily.     cyclobenzaprine (FLEXERIL) 10 MG tablet Take 1 tablet (10 mg total) by mouth 2 (two) times daily as needed for muscle spasms. 20 tablet 0   fluticasone (FLONASE) 50 MCG/ACT nasal spray Place 1 spray into both nostrils daily.     meloxicam (MOBIC) 15 MG tablet Take 15 mg by mouth daily.     Oxcarbazepine (TRILEPTAL) 300 MG tablet Take 1 tablet (300 mg total) by mouth 2 (two) times daily. 60 tablet 0  QUEtiapine (SEROQUEL) 400 MG tablet Take 1 tablet (400 mg total) by mouth 2 (two) times daily. 60 tablet 0   sertraline (ZOLOFT) 50 MG tablet Take 3 tablets (150 mg total) by mouth in the morning. 90 tablet 0   traZODone (DESYREL) 100 MG tablet Take 1 tablet (100 mg total) by mouth at bedtime. 30 tablet 0   valACYclovir (VALTREX) 500 MG tablet Take 500 mg by mouth daily.      Labs  Lab Results:  Admission on 07/25/2022  Component Date Value Ref Range Status   SARS Coronavirus 2 by RT PCR 07/25/2022 NEGATIVE  NEGATIVE Final   Comment: (NOTE) SARS-CoV-2 target nucleic acids are NOT DETECTED.  The SARS-CoV-2 RNA is generally detectable in upper respiratory specimens during the acute phase of infection. The lowest concentration of SARS-CoV-2 viral copies this assay can detect is 138 copies/mL. A negative result does not preclude SARS-Cov-2 infection and should not be used as the sole basis for treatment or other patient management decisions. A negative result may occur with  improper specimen collection/handling, submission of specimen other than nasopharyngeal swab, presence of viral mutation(s) within the areas targeted by this assay, and inadequate number of viral copies(<138 copies/mL). A negative result must be combined with clinical observations, patient history, and epidemiological information. The expected result is Negative.  Fact Sheet for Patients:  BloggerCourse.com  Fact Sheet for Healthcare Providers:   SeriousBroker.it  This test is no                          t yet approved or cleared by the [place] FDA and  has been authorized for detection and/or diagnosis of SARS-CoV-2 by FDA under an Emergency Use Authorization (EUA). This EUA will remain  in effect (meaning this test can be used) for the duration of the COVID-19 declaration under Section 564(b)(1) of the Act, 21 U.S.C.section 360bbb-3(b)(1), unless the authorization is terminated  or revoked sooner.       Influenza A by PCR 07/25/2022 NEGATIVE  NEGATIVE Final   Influenza B by PCR 07/25/2022 NEGATIVE  NEGATIVE Final   Comment: (NOTE) The Xpert Xpress SARS-CoV-2/FLU/RSV plus assay is intended as an aid in the diagnosis of influenza from Nasopharyngeal swab specimens and should not be used as a sole basis for treatment. Nasal washings and aspirates are unacceptable for Xpert Xpress SARS-CoV-2/FLU/RSV testing.  Fact Sheet for Patients: BloggerCourse.com  Fact Sheet for Healthcare Providers: SeriousBroker.it  This test is not yet approved or cleared by the [place] FDA and has been authorized for detection and/or diagnosis of SARS-CoV-2 by FDA under an Emergency Use Authorization (EUA). This EUA will remain in effect (meaning this test can be used) for the duration of the COVID-19 declaration under Section 564(b)(1) of the Act, 21 U.S.C. section 360bbb-3(b)(1), unless the authorization is terminated or revoked.  Performed at [facility], [address] [place], [place] [address]    WBC 07/25/2022 8.3  4.0 - 10.5 K/uL Final   RBC 07/25/2022 4.44  3.87 - 5.11 MIL/uL Final   Hemoglobin 07/25/2022 13.7  12.0 - 15.0 g/dL Final   HCT 56/31/4970 40.2  36.0 - 46.0 % Final   MCV 07/25/2022 90.5  80.0 - 100.0 fL Final   MCH 07/25/2022 30.9  26.0 - 34.0 pg Final   MCHC 07/25/2022 34.1  30.0 - 36.0 g/dL Final   RDW 26/37/8588 12.2  11.5  - 15.5 % Final   Platelets  07/25/2022 248  150 - 400 K/uL Final   nRBC 07/25/2022 0.0  0.0 - 0.2 % Final   Neutrophils Relative % 07/25/2022 43  % Final   Neutro Abs 07/25/2022 3.6  1.7 - 7.7 K/uL Final   Lymphocytes Relative 07/25/2022 52  % Final   Lymphs Abs 07/25/2022 4.2 (H)  0.7 - 4.0 K/uL Final   Monocytes Relative 07/25/2022 5  % Final   Monocytes Absolute 07/25/2022 0.4  0.1 - 1.0 K/uL Final   Eosinophils Relative 07/25/2022 0  % Final   Eosinophils Absolute 07/25/2022 0.0  0.0 - 0.5 K/uL Final   Basophils Relative 07/25/2022 0  % Final   Basophils Absolute 07/25/2022 0.0  0.0 - 0.1 K/uL Final   Immature Granulocytes 07/25/2022 0  % Final   Abs Immature Granulocytes 07/25/2022 0.02  0.00 - 0.07 K/uL Final   Performed at [facility], [address], [place], [place] [address]   Sodium 07/25/2022 138  135 - 145 mmol/L Final   Potassium 07/25/2022 4.0  3.5 - 5.1 mmol/L Final   Chloride 07/25/2022 104  98 - 111 mmol/L Final   CO2 07/25/2022 29  22 - 32 mmol/L Final   Glucose, Bld 07/25/2022 83  70 - 99 mg/dL Final   Glucose reference range applies only to samples taken after fasting for at least 8 hours.   BUN 07/25/2022 11  6 - 20 mg/dL Final   Creatinine, Ser 07/25/2022 0.97  0.44 - 1.00 mg/dL Final   Calcium 19/14/7829 9.2  8.9 - 10.3 mg/dL Final   Total Protein 56/21/3086 7.0  6.5 - 8.1 g/dL Final   Albumin 57/84/6962 3.8  3.5 - 5.0 g/dL Final   AST 95/28/4132 18  15 - 41 U/L Final   ALT 07/25/2022 22  0 - 44 U/L Final   Alkaline Phosphatase 07/25/2022 64  38 - 126 U/L Final   Total Bilirubin 07/25/2022 0.2 (L)  0.3 - 1.2 mg/dL Final   GFR, Estimated 07/25/2022 >60  >60 mL/min Final   Comment: (NOTE) Calculated using the CKD-EPI Creatinine Equation (2021)    Anion gap 07/25/2022 5  5 - 15 Final   Performed at [facility], [address] [place], [place] [address]   POC Amphetamine UR 07/25/2022 None Detected  NONE DETECTED (Cut Off Level 1000 ng/mL)  Preliminary   POC Secobarbital (BAR) 07/25/2022 None Detected  NONE DETECTED (Cut Off Level 300 ng/mL) Preliminary   POC Buprenorphine (BUP) 07/25/2022 None Detected  NONE DETECTED (Cut Off Level 10 ng/mL) Preliminary   POC Oxazepam (BZO) 07/25/2022 None Detected  NONE DETECTED (Cut Off Level 300 ng/mL) Preliminary   POC Cocaine UR 07/25/2022 None Detected  NONE DETECTED (Cut Off Level 300 ng/mL) Preliminary   POC Methamphetamine UR 07/25/2022 None Detected  NONE DETECTED (Cut Off Level 1000 ng/mL) Preliminary   POC Morphine 07/25/2022 None Detected  NONE DETECTED (Cut Off Level 300 ng/mL) Preliminary   POC Methadone UR 07/25/2022 None Detected  NONE DETECTED (Cut Off Level 300 ng/mL) Preliminary   POC Oxycodone UR 07/25/2022 None Detected  NONE DETECTED (Cut Off Level 100 ng/mL) Preliminary   POC Marijuana UR 07/25/2022 None Detected  NONE DETECTED (Cut Off Level 50 ng/mL) Preliminary   SARSCOV2ONAVIRUS 2 AG 07/25/2022 NEGATIVE  NEGATIVE Final   Comment: (NOTE) SARS-CoV-2 antigen NOT DETECTED.   Negative results are presumptive.  Negative results do not preclude SARS-CoV-2 infection and should not be used as the sole basis for  treatment or other patient management decisions, including infection  control decisions, particularly in the presence of clinical signs and  symptoms consistent with COVID-19, or in those who have been in contact with the virus.  Negative results must be combined with clinical observations, patient history, and epidemiological information. The expected result is Negative.  Fact Sheet for Patients: https://www.jennings-kim.com/  Fact Sheet for Healthcare Providers: https://alexander-rogers.biz/  This test is not yet approved or cleared by the [place] FDA and  has been authorized for detection and/or diagnosis of SARS-CoV-2 by FDA under an Emergency Use Authorization (EUA).  This EUA will remain in effect (meaning this test can be  used) for the duration of  the COV                          ID-19 declaration under Section 564(b)(1) of the Act, 21 U.S.C. section 360bbb-3(b)(1), unless the authorization is terminated or revoked sooner.     Cholesterol 07/25/2022 181  0 - 200 mg/dL Final   Triglycerides 96/01/5408 118  <150 mg/dL Final   HDL 81/19/1478 45  >40 mg/dL Final   Total CHOL/HDL Ratio 07/25/2022 4.0  RATIO Final   VLDL 07/25/2022 24  0 - 40 mg/dL Final   LDL Cholesterol 07/25/2022 112 (H)  0 - 99 mg/dL Final   Comment:        Total Cholesterol/HDL:CHD Risk Coronary Heart Disease Risk Table                     Men   Women  1/2 Average Risk   3.4   3.3  Average Risk       5.0   4.4  2 X Average Risk   9.6   7.1  3 X Average Risk  23.4   11.0        Use the calculated Patient Ratio above and the CHD Risk Table to determine the patient's CHD Risk.        ATP III CLASSIFICATION (LDL):  <100     mg/dL   Optimal  295-621  mg/dL   Near or Above                    Optimal  130-159  mg/dL   Borderline  308-657  mg/dL   High  >846     mg/dL   Very High Performed at [facility], [address] [place], [place] [address]    TSH 07/25/2022 6.668 (H)  0.350 - 4.500 uIU/mL Final   Comment: Performed by a 3rd Generation assay with a functional sensitivity of <=0.01 uIU/mL. Performed at [facility], [address] [place], [place] [address]    Glucose-Capillary 07/26/2022 104 (H)  70 - 99 mg/dL Final   Glucose reference range applies only to samples taken after fasting for at least 8 hours.   T3, Free 07/31/2022 2.3  2.0 - 4.4 pg/mL Final   Comment: (NOTE) Performed At: [facility] [address] [place], [place] [id number] Jolene Schimke MD ZD:[id number]    Free T4 07/31/2022 0.60 (L)  0.61 - 1.12 ng/dL Final   Comment: (NOTE) Biotin ingestion may interfere with free T4 tests. If the results are inconsistent with the TSH level, previous test results, or the clinical  presentation, then consider biotin interference. If needed, order repeat testing after stopping biotin. Performed at [facility], [address], [place], [place] [address]  Glucose-Capillary 08/29/2022 100 (H)  70 - 99 mg/dL Final   Glucose reference range applies only to samples taken after fasting for at least 8 hours.   TSH 09/05/2022 0.793  0.350 - 4.500 uIU/mL Final   Comment: Performed by a 3rd Generation assay with a functional sensitivity of <=0.01 uIU/mL. Performed at [facility], [address], [place], [place] [address]    Free T4 09/05/2022 0.73  0.61 - 1.12 ng/dL Final   Comment: (NOTE) Biotin ingestion may interfere with free T4 tests. If the results are inconsistent with the TSH level, previous test results, or the clinical presentation, then consider biotin interference. If needed, order repeat testing after stopping biotin. Performed at [facility], [address], [place], [place] [address]    Preg Test, Ur 09/06/2022 Negative  Negative Final   Preg Test, Ur 09/06/2022 NEGATIVE  NEGATIVE Final   Comment:        THE SENSITIVITY OF THIS METHODOLOGY IS >24 mIU/mL    Preg Test, Ur 09/05/2022 NEGATIVE  NEGATIVE Final   Comment:        THE SENSITIVITY OF THIS METHODOLOGY IS >24 mIU/mL    Sodium 09/13/2022 136  135 - 145 mmol/L Final   Potassium 09/13/2022 4.5  3.5 - 5.1 mmol/L Final   Chloride 09/13/2022 105  98 - 111 mmol/L Final   CO2 09/13/2022 21 (L)  22 - 32 mmol/L Final   Glucose, Bld 09/13/2022 111 (H)  70 - 99 mg/dL Final   Glucose reference range applies only to samples taken after fasting for at least 8 hours.   BUN 09/13/2022 14  6 - 20 mg/dL Final   Creatinine, Ser 09/13/2022 0.92  0.44 - 1.00 mg/dL Final   Calcium 46/96/2952 9.1  8.9 - 10.3 mg/dL Final   GFR, Estimated 09/13/2022 >60  >60 mL/min Final   Comment: (NOTE) Calculated using the CKD-EPI Creatinine Equation (2021)    Anion gap 09/13/2022 10  5 - 15 Final    Performed at [facility], [address] [place], [place] [address]   Vitamin B-12 09/13/2022 585  180 - 914 pg/mL Final   Comment: (NOTE) This assay is not validated for testing neonatal or myeloproliferative syndrome specimens for Vitamin B12 levels. Performed at [facility], [address] [place], [place] [address]    Color, Urine 09/14/2022 YELLOW  YELLOW Final   APPearance 09/14/2022 HAZY (A)  CLEAR Final   Specific Gravity, Urine 09/14/2022 1.025  1.005 - 1.030 Final   pH 09/14/2022 5.0  5.0 - 8.0 Final   Glucose, UA 09/14/2022 NEGATIVE  NEGATIVE mg/dL Final   Hgb urine dipstick 09/14/2022 NEGATIVE  NEGATIVE Final   Bilirubin Urine 09/14/2022 NEGATIVE  NEGATIVE Final   Ketones, ur 09/14/2022 NEGATIVE  NEGATIVE mg/dL Final   Protein, ur 27/25/3664 NEGATIVE  NEGATIVE mg/dL Final   Nitrite 40/34/7425 NEGATIVE  NEGATIVE Final   Leukocytes,Ua 09/14/2022 TRACE (A)  NEGATIVE Final   RBC / HPF 09/14/2022 0-5  0 - 5 RBC/hpf Final   WBC, UA 09/14/2022 0-5  0 - 5 WBC/hpf Final   Bacteria, UA 09/14/2022 RARE (A)  NONE SEEN Final   Squamous Epithelial / LPF 09/14/2022 0-5  0 - 5 Final   Mucus 09/14/2022 PRESENT   Final   Performed at [facility], [address] [place], [place] [address]   Glucose-Capillary 09/16/2022 105 (H)  70 - 99 mg/dL Final   Glucose reference  range applies only to samples taken after fasting for at least 8 hours.  Admission on 07/04/2022, Discharged on 07/05/2022  Component Date Value Ref Range Status   Sodium 07/04/2022 142  135 - 145 mmol/L Final   Potassium 07/04/2022 4.4  3.5 - 5.1 mmol/L Final   Chloride 07/04/2022 109  98 - 111 mmol/L Final   CO2 07/04/2022 26  22 - 32 mmol/L Final   Glucose, Bld 07/04/2022 96  70 - 99 mg/dL Final   Glucose reference range applies only to samples taken after fasting for at least 8 hours.   BUN 07/04/2022 15  6 - 20 mg/dL Final   Creatinine, Ser 07/04/2022 0.83  0.44 - 1.00 mg/dL Final   Calcium  16/07/9603 9.3  8.9 - 10.3 mg/dL Final   Total Protein 54/06/8118 7.2  6.5 - 8.1 g/dL Final   Albumin 14/78/2956 3.9  3.5 - 5.0 g/dL Final   AST 21/30/8657 23  15 - 41 U/L Final   ALT 07/04/2022 28  0 - 44 U/L Final   Alkaline Phosphatase 07/04/2022 77  38 - 126 U/L Final   Total Bilirubin 07/04/2022 0.4  0.3 - 1.2 mg/dL Final   GFR, Estimated 07/04/2022 >60  >60 mL/min Final   Comment: (NOTE) Calculated using the CKD-EPI Creatinine Equation (2021)    Anion gap 07/04/2022 7  5 - 15 Final   Performed at [facility], [address], [place], [place] [address]   Alcohol, Ethyl (B) 07/04/2022 <10  <10 mg/dL Final   Comment: (NOTE) Lowest detectable limit for serum alcohol is 10 mg/dL.  For medical purposes only. Performed at [facility], [address] [place], [place] [address]    WBC 07/04/2022 7.0  4.0 - 10.5 K/uL Final   RBC 07/04/2022 4.18  3.87 - 5.11 MIL/uL Final   Hemoglobin 07/04/2022 12.8  12.0 - 15.0 g/dL Final   HCT 41/32/4401 39.1  36.0 - 46.0 % Final   MCV 07/04/2022 93.5  80.0 - 100.0 fL Final   MCH 07/04/2022 30.6  26.0 - 34.0 pg Final   MCHC 07/04/2022 32.7  30.0 - 36.0 g/dL Final   RDW 02/72/5366 12.9  11.5 - 15.5 % Final   Platelets 07/04/2022 243  150 - 400 K/uL Final   nRBC 07/04/2022 0.0  0.0 - 0.2 % Final   Neutrophils Relative % 07/04/2022 43  % Final   Neutro Abs 07/04/2022 3.0  1.7 - 7.7 K/uL Final   Lymphocytes Relative 07/04/2022 50  % Final   Lymphs Abs 07/04/2022 3.5  0.7 - 4.0 K/uL Final   Monocytes Relative 07/04/2022 7  % Final   Monocytes Absolute 07/04/2022 0.5  0.1 - 1.0 K/uL Final   Eosinophils Relative 07/04/2022 0  % Final   Eosinophils Absolute 07/04/2022 0.0  0.0 - 0.5 K/uL Final   Basophils Relative 07/04/2022 0  % Final   Basophils Absolute 07/04/2022 0.0  0.0 - 0.1 K/uL Final   Immature Granulocytes 07/04/2022 0  % Final   Abs Immature Granulocytes 07/04/2022 0.01  0.00 - 0.07 K/uL Final    Performed at [facility], [address] [place], [place] [address]   I-stat hCG, quantitative 07/04/2022 <5.0  <5 mIU/mL Final   Comment 3 07/04/2022          Final   Comment:   GEST. AGE      CONC.  (mIU/mL)   <=1 WEEK  5 - 50     2 WEEKS       50 - 500     3 WEEKS       100 - 10,000     4 WEEKS     1,000 - 30,000        FEMALE AND NON-PREGNANT FEMALE:     LESS THAN 5 mIU/mL   Admission on 06/14/2022, Discharged on 06/14/2022  Component Date Value Ref Range Status   Color, UA 06/14/2022 yellow  yellow Final   Clarity, UA 06/14/2022 cloudy (A)  clear Final   Glucose, UA 06/14/2022 negative  negative mg/dL Final   Bilirubin, UA 82/95/621309/10/2021 negative  negative Final   Ketones, POC UA 06/14/2022 negative  negative mg/dL Final   Spec Grav, UA 08/65/784609/10/2021 1.025  1.010 - 1.025 Final   Blood, UA 06/14/2022 negative  negative Final   pH, UA 06/14/2022 7.5  5.0 - 8.0 Final   Protein Ur, POC 06/14/2022 =30 (A)  negative mg/dL Final   Urobilinogen, UA 06/14/2022 0.2  0.2 or 1.0 E.U./dL Final   Nitrite, UA 96/29/528409/10/2021 Negative  Negative Final   Leukocytes, UA 06/14/2022 Negative  Negative Final   Preg Test, Ur 06/14/2022 Negative  Negative Final   Specimen Description 06/14/2022 URINE, CLEAN CATCH   Final   Special Requests 06/14/2022    Final                   Value:NONE Performed at [facility], [address] [place], [place] [id number]    Culture 06/14/2022 MULTIPLE SPECIES PRESENT, SUGGEST RECOLLECTION (A)   Final   Report Status 06/14/2022 06/16/2022 FINAL   Final  Admission on 06/07/2022, Discharged on 06/10/2022  Component Date Value Ref Range Status   SARS Coronavirus 2 by RT PCR 06/07/2022 NEGATIVE  NEGATIVE Final   Comment: (NOTE) SARS-CoV-2 target nucleic acids are NOT DETECTED.  The SARS-CoV-2 RNA is generally detectable in upper respiratory specimens during the acute phase of infection. The lowest concentration of SARS-CoV-2 viral copies this  assay can detect is 138 copies/mL. A negative result does not preclude SARS-Cov-2 infection and should not be used as the sole basis for treatment or other patient management decisions. A negative result may occur with  improper specimen collection/handling, submission of specimen other than nasopharyngeal swab, presence of viral mutation(s) within the areas targeted by this assay, and inadequate number of viral copies(<138 copies/mL). A negative result must be combined with clinical observations, patient history, and epidemiological information. The expected result is Negative.  Fact Sheet for Patients:  BloggerCourse.comhttps://www.fda.gov/media/152166/download  Fact Sheet for Healthcare Providers:  SeriousBroker.ithttps://www.fda.gov/media/152162/download  This test is no                          t yet approved or cleared by the [place] FDA and  has been authorized for detection and/or diagnosis of SARS-CoV-2 by FDA under an Emergency Use Authorization (EUA). This EUA will remain  in effect (meaning this test can be used) for the duration of the COVID-19 declaration under Section 564(b)(1) of the Act, 21 U.S.C.section 360bbb-3(b)(1), unless the authorization is terminated  or revoked sooner.       Influenza A by PCR 06/07/2022 NEGATIVE  NEGATIVE Final   Influenza B by PCR 06/07/2022 NEGATIVE  NEGATIVE Final   Comment: (NOTE) The Xpert Xpress SARS-CoV-2/FLU/RSV plus assay is intended as an aid in the diagnosis of influenza from Nasopharyngeal swab specimens and  should not be used as a sole basis for treatment. Nasal washings and aspirates are unacceptable for Xpert Xpress SARS-CoV-2/FLU/RSV testing.  Fact Sheet for Patients: BloggerCourse.com  Fact Sheet for Healthcare Providers: SeriousBroker.it  This test is not yet approved or cleared by the [place] FDA and has been authorized for detection and/or diagnosis of SARS-CoV-2 by FDA under an  Emergency Use Authorization (EUA). This EUA will remain in effect (meaning this test can be used) for the duration of the COVID-19 declaration under Section 564(b)(1) of the Act, 21 U.S.C. section 360bbb-3(b)(1), unless the authorization is terminated or revoked.  Performed at [facility] [place] [facility], [address] [place], [place] [address]    WBC 06/07/2022 5.7  4.0 - 10.5 K/uL Final   RBC 06/07/2022 4.39  3.87 - 5.11 MIL/uL Final   Hemoglobin 06/07/2022 13.2  12.0 - 15.0 g/dL Final   HCT 60/45/4098 40.4  36.0 - 46.0 % Final   MCV 06/07/2022 92.0  80.0 - 100.0 fL Final   MCH 06/07/2022 30.1  26.0 - 34.0 pg Final   MCHC 06/07/2022 32.7  30.0 - 36.0 g/dL Final   RDW 11/91/4782 12.4  11.5 - 15.5 % Final   Platelets 06/07/2022 308  150 - 400 K/uL Final   nRBC 06/07/2022 0.0  0.0 - 0.2 % Final   Neutrophils Relative % 06/07/2022 42  % Final   Neutro Abs 06/07/2022 2.4  1.7 - 7.7 K/uL Final   Lymphocytes Relative 06/07/2022 54  % Final   Lymphs Abs 06/07/2022 3.1  0.7 - 4.0 K/uL Final   Monocytes Relative 06/07/2022 4  % Final   Monocytes Absolute 06/07/2022 0.2  0.1 - 1.0 K/uL Final   Eosinophils Relative 06/07/2022 0  % Final   Eosinophils Absolute 06/07/2022 0.0  0.0 - 0.5 K/uL Final   Basophils Relative 06/07/2022 0  % Final   Basophils Absolute 06/07/2022 0.0  0.0 - 0.1 K/uL Final   Immature Granulocytes 06/07/2022 0  % Final   Abs Immature Granulocytes 06/07/2022 0.01  0.00 - 0.07 K/uL Final   Performed at [facility], [address], [place], [place] [address]   Sodium 06/07/2022 139  135 - 145 mmol/L Final   Potassium 06/07/2022 4.0  3.5 - 5.1 mmol/L Final   Chloride 06/07/2022 104  98 - 111 mmol/L Final   CO2 06/07/2022 28  22 - 32 mmol/L Final   Glucose, Bld 06/07/2022 104 (H)  70 - 99 mg/dL Final   Glucose reference range applies only to samples taken after fasting for at least 8 hours.   BUN 06/07/2022 8  6 - 20 mg/dL Final   Creatinine, Ser 06/07/2022 0.84  0.44 -  1.00 mg/dL Final   Calcium 30/86/5784 9.1  8.9 - 10.3 mg/dL Final   Total Protein 69/62/9528 6.9  6.5 - 8.1 g/dL Final   Albumin 41/32/4401 3.7  3.5 - 5.0 g/dL Final   AST 02/72/5366 19  15 - 41 U/L Final   ALT 06/07/2022 22  0 - 44 U/L Final   Alkaline Phosphatase 06/07/2022 54  38 - 126 U/L Final   Total Bilirubin 06/07/2022 0.4  0.3 - 1.2 mg/dL Final   GFR, Estimated 06/07/2022 >60  >60 mL/min Final   Comment: (NOTE) Calculated using the CKD-EPI Creatinine Equation (2021)    Anion gap 06/07/2022 7  5 - 15 Final   Performed at [id number], [address], [place], [place] [address]   Hgb  A1c MFr Bld 06/07/2022 5.0  4.8 - 5.6 % Final   Comment: (NOTE) Pre diabetes:          5.7%-6.4%  Diabetes:              >6.4%  Glycemic control for   <7.0% adults with diabetes    Mean Plasma Glucose 06/07/2022 96.8  mg/dL Final   Performed at [facility], [address], [place], [place] [address]   TSH 06/07/2022 1.620  0.350 - 4.500 uIU/mL Final   Comment: Performed by a 3rd Generation assay with a functional sensitivity of <=0.01 uIU/mL. Performed at [facility], [address] [place], [place] [address]    RPR Ser Ql 06/07/2022 NON REACTIVE  NON REACTIVE Final   Performed at [facility], [address] [place], [place] [address]   Color, Urine 06/07/2022 YELLOW  YELLOW Final   APPearance 06/07/2022 HAZY (A)  CLEAR Final   Specific Gravity, Urine 06/07/2022 1.018  1.005 - 1.030 Final   pH 06/07/2022 7.0  5.0 - 8.0 Final   Glucose, UA 06/07/2022 NEGATIVE  NEGATIVE mg/dL Final   Hgb urine dipstick 06/07/2022 NEGATIVE  NEGATIVE Final   Bilirubin Urine 06/07/2022 NEGATIVE  NEGATIVE Final   Ketones, ur 06/07/2022 NEGATIVE  NEGATIVE mg/dL Final   Protein, ur 91/47/8295 NEGATIVE  NEGATIVE mg/dL Final   Nitrite 62/13/0865 NEGATIVE  NEGATIVE Final   Leukocytes,Ua 06/07/2022 NEGATIVE  NEGATIVE Final   Performed at [facility], [address]  [place], [place] [address]   Cholesterol 06/07/2022 164  0 - 200 mg/dL Final   Triglycerides 62/95/2841 158 (H)  <150 mg/dL Final   HDL 32/44/0102 44  >40 mg/dL Final   Total CHOL/HDL Ratio 06/07/2022 3.7  RATIO Final   VLDL 06/07/2022 32  0 - 40 mg/dL Final   LDL Cholesterol 06/07/2022 88  0 - 99 mg/dL Final   Comment:        Total Cholesterol/HDL:CHD Risk Coronary Heart Disease Risk Table                     Men   Women  1/2 Average Risk   3.4   3.3  Average Risk       5.0   4.4  2 X Average Risk   9.6   7.1  3 X Average Risk  23.4   11.0        Use the calculated Patient Ratio above and the CHD Risk Table to determine the patient's CHD Risk.        ATP III CLASSIFICATION (LDL):  <100     mg/dL   Optimal  725-366  mg/dL   Near or Above                    Optimal  130-159  mg/dL   Borderline  440-347  mg/dL   High  >425     mg/dL   Very High Performed at [facility], [address], [place], [place] [address]    HIV Screen 4th Generation wRfx 06/07/2022 Non Reactive  Non Reactive Final   Performed at [facility], [address], [place], [place] [address]   SARSCOV2ONAVIRUS 2 AG 06/07/2022 NEGATIVE  NEGATIVE Final   Comment: (NOTE) SARS-CoV-2 antigen NOT DETECTED.   Negative results are presumptive.  Negative results do not preclude SARS-CoV-2 infection and should not be used as the sole basis  for treatment or other patient management decisions, including infection  control decisions, particularly in the presence of clinical signs and  symptoms consistent with COVID-19, or in those who have been in contact with the virus.  Negative results must be combined with clinical observations, patient history, and epidemiological information. The expected result is Negative.  Fact Sheet for Patients: https://www.jennings-kim.com/  Fact Sheet for Healthcare Providers: https://alexander-rogers.biz/  This test is not yet approved or cleared by the  [place] FDA and  has been authorized for detection and/or diagnosis of SARS-CoV-2 by FDA under an Emergency Use Authorization (EUA).  This EUA will remain in effect (meaning this test can be used) for the duration of  the COV                          ID-19 declaration under Section 564(b)(1) of the Act, 21 U.S.C. section 360bbb-3(b)(1), unless the authorization is terminated or revoked sooner.     POC Amphetamine UR 06/07/2022 None Detected  NONE DETECTED (Cut Off Level 1000 ng/mL) Final   POC Secobarbital (BAR) 06/07/2022 None Detected  NONE DETECTED (Cut Off Level 300 ng/mL) Final   POC Buprenorphine (BUP) 06/07/2022 None Detected  NONE DETECTED (Cut Off Level 10 ng/mL) Final   POC Oxazepam (BZO) 06/07/2022 None Detected  NONE DETECTED (Cut Off Level 300 ng/mL) Final   POC Cocaine UR 06/07/2022 None Detected  NONE DETECTED (Cut Off Level 300 ng/mL) Final   POC Methamphetamine UR 06/07/2022 None Detected  NONE DETECTED (Cut Off Level 1000 ng/mL) Final   POC Morphine 06/07/2022 None Detected  NONE DETECTED (Cut Off Level 300 ng/mL) Final   POC Methadone UR 06/07/2022 None Detected  NONE DETECTED (Cut Off Level 300 ng/mL) Final   POC Oxycodone UR 06/07/2022 None Detected  NONE DETECTED (Cut Off Level 100 ng/mL) Final   POC Marijuana UR 06/07/2022 None Detected  NONE DETECTED (Cut Off Level 50 ng/mL) Final   Preg Test, Ur 06/08/2022 NEGATIVE  NEGATIVE Final   Comment:        THE SENSITIVITY OF THIS METHODOLOGY IS >20 mIU/mL. Performed at [facility], [address] [place], [place] [address]    Preg Test, Ur 06/08/2022 NEGATIVE  NEGATIVE Final   Comment:        THE SENSITIVITY OF THIS METHODOLOGY IS >24 mIU/mL     Blood Alcohol level:  Lab Results  Component Value Date   ETH <10 07/04/2022   ETH <10 02/07/2022    Metabolic Disorder Labs: Lab Results  Component Value Date   HGBA1C 5.0 06/07/2022   MPG 96.8 06/07/2022   MPG 96.8 02/07/2022   No results found  for: "PROLACTIN" Lab Results  Component Value Date   CHOL 181 07/25/2022   TRIG 118 07/25/2022   HDL 45 07/25/2022   CHOLHDL 4.0 07/25/2022   VLDL 24 07/25/2022   LDLCALC 112 (H) 07/25/2022   LDLCALC 88 06/07/2022    Therapeutic Lab Levels: No results found for: "LITHIUM" No results found for: "VALPROATE" No results found for: "CBMZ"  Physical Findings   GAD-7    Flowsheet Row [facility] Visit [facility] 12/17/2021 in [facility]  Total GAD-7 Score 17      PHQ2-9    Flowsheet Row ED from 06/07/2022 in [facility] Office Visit from 12/17/2021 in [facility]  PHQ-2 Total Score 1 2  PHQ-9 Total Score 2 8  Flowsheet Row ED from 07/25/2022 in [facility] ED from 07/04/2022 in [facility] DEPT ED from 06/14/2022 in [facility]   C-SSRS RISK CATEGORY No Risk No Risk No Risk        Musculoskeletal  Strength & Muscle Tone: within normal limits Gait & Station: normal Patient leans: N/A  Psychiatric Specialty Exam  Presentation  General Appearance:  Appropriate for Environment  Eye Contact: Fair  Speech: Clear and Coherent  Speech Volume: Normal  Handedness: Right   Mood and Affect  Mood: -- ("tired")  Affect: Congruent; Blunt   Thought Process  Thought Processes: Coherent  Descriptions of Associations:Intact  Orientation:Full (Time, Place and Person)  Thought Content:Logical  Diagnosis of Schizophrenia or Schizoaffective disorder in past: Yes  Duration of Psychotic Symptoms: Greater than six months   Hallucinations:Hallucinations: None  Ideas of Reference:None  Suicidal Thoughts:Suicidal Thoughts: No  Homicidal Thoughts:Homicidal Thoughts: No   Sensorium  Memory: Immediate Fair  Judgment: Fair  Insight: Fair   Art therapist  Concentration: Fair  Attention  Span: Fair  Recall: [employer] of Knowledge: Fair  Language: Fair   Psychomotor Activity  Psychomotor Activity: Psychomotor Activity: Normal   Assets  Assets: Communication Skills; Desire for Improvement; Financial Resources/Insurance   Sleep  Sleep: Sleep: Fair   No data recorded  Physical Exam  Physical Exam Constitutional:      General: She is not in acute distress.    Appearance: She is not ill-appearing, toxic-appearing or diaphoretic.  Eyes:     General: No scleral icterus. Cardiovascular:     Rate and Rhythm: Normal rate.  Pulmonary:     Effort: Pulmonary effort is normal. No respiratory distress.  Neurological:     Mental Status: She is alert and oriented to person, place, and time.  Psychiatric:        Attention and Perception: Attention and perception normal.        Mood and Affect: Affect is blunt.        Speech: Speech normal.        Behavior: Behavior normal. Behavior is cooperative.        Thought Content: Thought content normal.    Review of Systems  Constitutional:  Negative for chills and fever.  Respiratory:  Negative for shortness of breath.   Cardiovascular:  Negative for chest pain and palpitations.  Gastrointestinal:  Negative for abdominal pain.  Neurological:  Negative for headaches.   Blood pressure 103/82, pulse 90, temperature 97.9 F (36.6 C), temperature source Oral, resp. rate 18, SpO2 100 %. There is no height or weight on file to calculate BMI.  Treatment Plan Summary: Plan Pt remains psychiatrically cleared. Disposition pending placement.  Lauree Chandler, NP 09/21/2022 11:28 AM

## 2022-09-22 MED ORDER — CALCIUM CARBONATE ANTACID 500 MG PO CHEW
1.0000 | CHEWABLE_TABLET | Freq: Once | ORAL | Status: AC
  Administered 2022-09-22: 200 mg via ORAL
  Filled 2022-09-22: qty 1

## 2022-09-22 NOTE — ED Notes (Signed)
Patient  sleeping in no acute stress. RR even and unlabored .Environment secured .Will continue to monitor for safely. 

## 2022-09-22 NOTE — ED Provider Notes (Signed)
Behavioral Health Progress Note  Date and Time: 09/22/2022 10:33 AM Name: Nicole [name] MRN:  [id number]  Subjective:   Nicole [name] is a 31 y.o. female, with PMH of SCZ unspecified, MDD with psychosis, who presented voluntary to [facility] (07/25/2022) via [facility] after a verbal altercation with [name] (not patient's legal guardian) for transient SI.  This is patient's fourth visit to [facility] for similar concerns this year. Patient has been dismissed from previous group home due to threatening SI and HI, then leaving the group home.   Legal Guardian: [name]   On reassessment, pt sleeping on my approach. Pt awakens to name being called. Pt endorses euthymic mood. She denies suicidal, homicidal or violent ideations. She denies auditory visual hallucinations or paranoia. She denies physical complaints. Pt remains psychiatrically cleared. Disposition is pending placement.  Diagnosis:  Final diagnoses:  Severe episode of recurrent major depressive disorder, with psychotic features (HCC)    Total Time spent with patient: 15 minutes  Past Psychiatric History: see HPI Past Medical History:  Past Medical History:  Diagnosis Date   Anxiety    Depression    Hypothyroidism 08/07/2022   Tobacco use disorder 08/07/2022    Past Surgical History:  Procedure Laterality Date   WISDOM TOOTH EXTRACTION Bilateral 2020   Family History:  Family History  Problem Relation Age of Onset   Hypertension Father    Diabetes Father    Family Psychiatric  History: None reported Social History:  Social History   Substance and Sexual Activity  Alcohol Use Never     Social History   Substance and Sexual Activity  Drug Use Never    Social History   Socioeconomic History   Marital status: Single    Spouse name: Not on file   Number of children: Not on file   Years of education: Not on file   Highest education  level: Not on file  Occupational History   Not on file  Tobacco Use   Smoking status: Every Day    Types: Cigarettes   Smokeless tobacco: Never  Vaping Use   Vaping Use: Some days  Substance and Sexual Activity   Alcohol use: Never   Drug use: Never   Sexual activity: Yes    Partners: Female    Birth control/protection: Implant  Other Topics Concern   Not on file  Social History Narrative   Not on file   Social Determinants of Health   Financial Resource Strain: Not on file  Food Insecurity: Not on file  Transportation Needs: Not on file  Physical Activity: Not on file  Stress: Not on file  Social Connections: Not on file   SDOH:  SDOH Screenings   Depression (PHQ2-9): Low Risk  (06/10/2022)  Tobacco Use: High Risk (09/18/2022)   Additional Social History:    Pain Medications: SEE MAR Prescriptions: SEE MAR Over the Counter: SEE MAR History of alcohol / drug use?: Yes Longest period of sobriety (when/how long): N/A Negative Consequences of Use:  (NONE) Withdrawal Symptoms: None Name of Substance 1: ETOH IN PAST--PT CURRENTLY DENIES 1 - Frequency: SOCIAL USE ETOH IN PAST 1 - Method of Aquiring: LEGAL 1- Route of Use: ORAL DRINK Name of Substance 2: NICOTINE-VAPE AND CIGARETTES 2 - Amount (size/oz): LESS THAN ONE PACK 2 - Frequency: DAILY 2 - Method of Aquiring: LEGAL 2 - Route of Substance Use: ORAL SMOKE  Sleep: Good  Appetite:  Good  Current Medications:  Current Facility-Administered Medications  Medication Dose Route Frequency Provider Last Rate Last Admin   acetaminophen (TYLENOL) tablet 650 mg  650 mg Oral Q6H PRN Onuoha, Chinwendu V, NP   650 mg at 09/21/22 2200   ARIPiprazole ER (ABILIFY MAINTENA) injection 400 mg  400 mg Intramuscular Q28 days Princess Bruins, DO   400 mg at 08/30/22 1338   docusate sodium (COLACE) capsule 100 mg  100 mg Oral Daily [name], MD   100 mg at 09/22/22 1610   hydrOXYzine (ATARAX) tablet 25 mg   25 mg Oral TID PRN Onuoha, Chinwendu V, NP   25 mg at 09/21/22 2106   ibuprofen (ADVIL) tablet 400 mg  400 mg Oral Q6H PRN Onuoha, Chinwendu V, NP   400 mg at 09/20/22 2115   levothyroxine (SYNTHROID) tablet 100 mcg  100 mcg Oral Q0600 Princess Bruins, DO   100 mcg at 09/22/22 0602   loratadine (CLARITIN) tablet 10 mg  10 mg Oral Daily Rankin, Shuvon B, NP   10 mg at 09/22/22 9604   metFORMIN (GLUCOPHAGE) tablet 500 mg  500 mg Oral Q breakfast Park Pope, MD   500 mg at 09/22/22 5409   nicotine (NICODERM CQ - dosed in mg/24 hours) patch 14 mg  14 mg Transdermal Q0600 Onuoha, Chinwendu V, NP   14 mg at 09/22/22 0928   ondansetron (ZOFRAN-ODT) disintegrating tablet 4 mg  4 mg Oral Q8H PRN Sindy Guadeloupe, NP   4 mg at 09/14/22 2351   Oxcarbazepine (TRILEPTAL) tablet 300 mg  300 mg Oral BID Onuoha, Chinwendu V, NP   300 mg at 09/22/22 0928   pantoprazole (PROTONIX) EC tablet 20 mg  20 mg Oral Daily Princess Bruins, DO   20 mg at 09/22/22 8119   QUEtiapine (SEROQUEL) tablet 400 mg  400 mg Oral BID Onuoha, Chinwendu V, NP   400 mg at 09/22/22 0928   sertraline (ZOLOFT) tablet 150 mg  150 mg Oral Daily Onuoha, Chinwendu V, NP   150 mg at 09/22/22 1478   traZODone (DESYREL) tablet 100 mg  100 mg Oral QHS Vernard Gambles H, NP   100 mg at 09/21/22 2106   valACYclovir (VALTREX) tablet 500 mg  500 mg Oral Daily Princess Bruins, DO   500 mg at 09/22/22 2956   ziprasidone (GEODON) injection 20 mg  20 mg Intramuscular Q12H PRN Onuoha, Chinwendu V, NP   20 mg at 09/19/22 2133   Current Outpatient Medications  Medication Sig Dispense Refill   ABILIFY MAINTENA 400 MG PRSY prefilled syringe 400 mg every 28 (twenty-eight) days.     cetirizine (ZYRTEC) 10 MG tablet Take 10 mg by mouth daily.     cyclobenzaprine (FLEXERIL) 10 MG tablet Take 1 tablet (10 mg total) by mouth 2 (two) times daily as needed for muscle spasms. 20 tablet 0   fluticasone (FLONASE) 50 MCG/ACT nasal spray Place 1 spray into both nostrils daily.      meloxicam (MOBIC) 15 MG tablet Take 15 mg by mouth daily.     Oxcarbazepine (TRILEPTAL) 300 MG tablet Take 1 tablet (300 mg total) by mouth 2 (two) times daily. 60 tablet 0   QUEtiapine (SEROQUEL) 400 MG tablet Take 1 tablet (400 mg total) by mouth 2 (two) times daily. 60 tablet 0   sertraline (ZOLOFT) 50 MG tablet Take 3 tablets (150 mg total) by mouth in the morning. 90 tablet 0   traZODone (DESYREL) 100 MG tablet  Take 1 tablet (100 mg total) by mouth at bedtime. 30 tablet 0   valACYclovir (VALTREX) 500 MG tablet Take 500 mg by mouth daily.      Labs  Lab Results:  Admission on 07/25/2022  Component Date Value Ref Range Status   SARS Coronavirus 2 by RT PCR 07/25/2022 NEGATIVE  NEGATIVE Final   Comment: (NOTE) SARS-CoV-2 target nucleic acids are NOT DETECTED.  The SARS-CoV-2 RNA is generally detectable in upper respiratory specimens during the acute phase of infection. The lowest concentration of SARS-CoV-2 viral copies this assay can detect is 138 copies/mL. A negative result does not preclude SARS-Cov-2 infection and should not be used as the sole basis for treatment or other patient management decisions. A negative result may occur with  improper specimen collection/handling, submission of specimen other than nasopharyngeal swab, presence of viral mutation(s) within the areas targeted by this assay, and inadequate number of viral copies(<138 copies/mL). A negative result must be combined with clinical observations, patient history, and epidemiological information. The expected result is Negative.  Fact Sheet for Patients:  BloggerCourse.com  Fact Sheet for Healthcare Providers:  SeriousBroker.it  This test is no                          t yet approved or cleared by the [place] FDA and  has been authorized for detection and/or diagnosis of SARS-CoV-2 by FDA under an Emergency Use Authorization (EUA). This EUA will remain   in effect (meaning this test can be used) for the duration of the COVID-19 declaration under Section 564(b)(1) of the Act, 21 U.S.C.section 360bbb-3(b)(1), unless the authorization is terminated  or revoked sooner.       Influenza A by PCR 07/25/2022 NEGATIVE  NEGATIVE Final   Influenza B by PCR 07/25/2022 NEGATIVE  NEGATIVE Final   Comment: (NOTE) The Xpert Xpress SARS-CoV-2/FLU/RSV plus assay is intended as an aid in the diagnosis of influenza from Nasopharyngeal swab specimens and should not be used as a sole basis for treatment. Nasal washings and aspirates are unacceptable for Xpert Xpress SARS-CoV-2/FLU/RSV testing.  Fact Sheet for Patients: BloggerCourse.com  Fact Sheet for Healthcare Providers: SeriousBroker.it  This test is not yet approved or cleared by the [place] FDA and has been authorized for detection and/or diagnosis of SARS-CoV-2 by FDA under an Emergency Use Authorization (EUA). This EUA will remain in effect (meaning this test can be used) for the duration of the COVID-19 declaration under Section 564(b)(1) of the Act, 21 U.S.C. section 360bbb-3(b)(1), unless the authorization is terminated or revoked.  Performed at [facility], [address] [place], [place] [address]    WBC 07/25/2022 8.3  4.0 - 10.5 K/uL Final   RBC 07/25/2022 4.44  3.87 - 5.11 MIL/uL Final   Hemoglobin 07/25/2022 13.7  12.0 - 15.0 g/dL Final   HCT 60/45/4098 40.2  36.0 - 46.0 % Final   MCV 07/25/2022 90.5  80.0 - 100.0 fL Final   MCH 07/25/2022 30.9  26.0 - 34.0 pg Final   MCHC 07/25/2022 34.1  30.0 - 36.0 g/dL Final   RDW 11/91/4782 12.2  11.5 - 15.5 % Final   Platelets 07/25/2022 248  150 - 400 K/uL Final   nRBC 07/25/2022 0.0  0.0 - 0.2 % Final   Neutrophils Relative % 07/25/2022 43  % Final   Neutro Abs 07/25/2022 3.6  1.7 - 7.7 K/uL Final   Lymphocytes Relative 07/25/2022 52  %  Final   Lymphs Abs 07/25/2022 4.2  (H)  0.7 - 4.0 K/uL Final   Monocytes Relative 07/25/2022 5  % Final   Monocytes Absolute 07/25/2022 0.4  0.1 - 1.0 K/uL Final   Eosinophils Relative 07/25/2022 0  % Final   Eosinophils Absolute 07/25/2022 0.0  0.0 - 0.5 K/uL Final   Basophils Relative 07/25/2022 0  % Final   Basophils Absolute 07/25/2022 0.0  0.0 - 0.1 K/uL Final   Immature Granulocytes 07/25/2022 0  % Final   Abs Immature Granulocytes 07/25/2022 0.02  0.00 - 0.07 K/uL Final   Performed at [facility], [address] [place], [place] [address]   Sodium 07/25/2022 138  135 - 145 mmol/L Final   Potassium 07/25/2022 4.0  3.5 - 5.1 mmol/L Final   Chloride 07/25/2022 104  98 - 111 mmol/L Final   CO2 07/25/2022 29  22 - 32 mmol/L Final   Glucose, Bld 07/25/2022 83  70 - 99 mg/dL Final   Glucose reference range applies only to samples taken after fasting for at least 8 hours.   BUN 07/25/2022 11  6 - 20 mg/dL Final   Creatinine, Ser 07/25/2022 0.97  0.44 - 1.00 mg/dL Final   Calcium 60/45/4098 9.2  8.9 - 10.3 mg/dL Final   Total Protein 11/91/4782 7.0  6.5 - 8.1 g/dL Final   Albumin 95/62/1308 3.8  3.5 - 5.0 g/dL Final   AST 65/78/4696 18  15 - 41 U/L Final   ALT 07/25/2022 22  0 - 44 U/L Final   Alkaline Phosphatase 07/25/2022 64  38 - 126 U/L Final   Total Bilirubin 07/25/2022 0.2 (L)  0.3 - 1.2 mg/dL Final   GFR, Estimated 07/25/2022 >60  >60 mL/min Final   Comment: (NOTE) Calculated using the CKD-EPI Creatinine Equation (2021)    Anion gap 07/25/2022 5  5 - 15 Final   Performed at [facility], [address], [place], [place] [address]   POC Amphetamine UR 07/25/2022 None Detected  NONE DETECTED (Cut Off Level 1000 ng/mL) Preliminary   POC Secobarbital (BAR) 07/25/2022 None Detected  NONE DETECTED (Cut Off Level 300 ng/mL) Preliminary   POC Buprenorphine (BUP) 07/25/2022 None Detected  NONE DETECTED (Cut Off Level 10 ng/mL) Preliminary   POC Oxazepam (BZO) 07/25/2022 None Detected  NONE DETECTED (Cut  Off Level 300 ng/mL) Preliminary   POC Cocaine UR 07/25/2022 None Detected  NONE DETECTED (Cut Off Level 300 ng/mL) Preliminary   POC Methamphetamine UR 07/25/2022 None Detected  NONE DETECTED (Cut Off Level 1000 ng/mL) Preliminary   POC Morphine 07/25/2022 None Detected  NONE DETECTED (Cut Off Level 300 ng/mL) Preliminary   POC Methadone UR 07/25/2022 None Detected  NONE DETECTED (Cut Off Level 300 ng/mL) Preliminary   POC Oxycodone UR 07/25/2022 None Detected  NONE DETECTED (Cut Off Level 100 ng/mL) Preliminary   POC Marijuana UR 07/25/2022 None Detected  NONE DETECTED (Cut Off Level 50 ng/mL) Preliminary   SARSCOV2ONAVIRUS 2 AG 07/25/2022 NEGATIVE  NEGATIVE Final   Comment: (NOTE) SARS-CoV-2 antigen NOT DETECTED.   Negative results are presumptive.  Negative results do not preclude SARS-CoV-2 infection and should not be used as the sole basis for treatment or other patient management decisions, including infection  control decisions, particularly in the presence of clinical signs and  symptoms consistent with COVID-19, or in those who have been in contact with the virus.  Negative results must be combined with clinical observations, patient history, and epidemiological  information. The expected result is Negative.  Fact Sheet for Patients: https://www.jennings-kim.com/  Fact Sheet for Healthcare Providers: https://alexander-rogers.biz/  This test is not yet approved or cleared by the [place] FDA and  has been authorized for detection and/or diagnosis of SARS-CoV-2 by FDA under an Emergency Use Authorization (EUA).  This EUA will remain in effect (meaning this test can be used) for the duration of  the COV                          ID-19 declaration under Section 564(b)(1) of the Act, 21 U.S.C. section 360bbb-3(b)(1), unless the authorization is terminated or revoked sooner.     Cholesterol 07/25/2022 181  0 - 200 mg/dL Final   Triglycerides  07/25/2022 118  <150 mg/dL Final   HDL 16/07/9603 45  >40 mg/dL Final   Total CHOL/HDL Ratio 07/25/2022 4.0  RATIO Final   VLDL 07/25/2022 24  0 - 40 mg/dL Final   LDL Cholesterol 07/25/2022 112 (H)  0 - 99 mg/dL Final   Comment:        Total Cholesterol/HDL:CHD Risk Coronary Heart Disease Risk Table                     Men   Women  1/2 Average Risk   3.4   3.3  Average Risk       5.0   4.4  2 X Average Risk   9.6   7.1  3 X Average Risk  23.4   11.0        Use the calculated Patient Ratio above and the CHD Risk Table to determine the patient's CHD Risk.        ATP III CLASSIFICATION (LDL):  <100     mg/dL   Optimal  540-981  mg/dL   Near or Above                    Optimal  130-159  mg/dL   Borderline  191-478  mg/dL   High  >295     mg/dL   Very High Performed at [facility], [address], [place], [place] [address]    TSH 07/25/2022 6.668 (H)  0.350 - 4.500 uIU/mL Final   Comment: Performed by a 3rd Generation assay with a functional sensitivity of <=0.01 uIU/mL. Performed at [facility], [address], [place], [place] [address]    Glucose-Capillary 07/26/2022 104 (H)  70 - 99 mg/dL Final   Glucose reference range applies only to samples taken after fasting for at least 8 hours.   T3, Free 07/31/2022 2.3  2.0 - 4.4 pg/mL Final   Comment: (NOTE) Performed At: [facility] [address] [place], [place] [id number] Jolene Schimke MD UX:[id number]    Free T4 07/31/2022 0.60 (L)  0.61 - 1.12 ng/dL Final   Comment: (NOTE) Biotin ingestion may interfere with free T4 tests. If the results are inconsistent with the TSH level, previous test results, or the clinical presentation, then consider biotin interference. If needed, order repeat testing after stopping biotin. Performed at [facility], [address] [place], [place] [address]    Glucose-Capillary 08/29/2022 100 (H)  70 - 99 mg/dL Final   Glucose reference range applies only to  samples taken after fasting for at least 8 hours.   TSH 09/05/2022 0.793  0.350 - 4.500 uIU/mL Final   Comment: Performed by a 3rd  Generation assay with a functional sensitivity of <=0.01 uIU/mL. Performed at [facility], [address] [place], [place] [address]    Free T4 09/05/2022 0.73  0.61 - 1.12 ng/dL Final   Comment: (NOTE) Biotin ingestion may interfere with free T4 tests. If the results are inconsistent with the TSH level, previous test results, or the clinical presentation, then consider biotin interference. If needed, order repeat testing after stopping biotin. Performed at [facility], [address], [place], [place] [address]    Preg Test, Ur 09/06/2022 Negative  Negative Final   Preg Test, Ur 09/06/2022 NEGATIVE  NEGATIVE Final   Comment:        THE SENSITIVITY OF THIS METHODOLOGY IS >24 mIU/mL    Preg Test, Ur 09/05/2022 NEGATIVE  NEGATIVE Final   Comment:        THE SENSITIVITY OF THIS METHODOLOGY IS >24 mIU/mL    Sodium 09/13/2022 136  135 - 145 mmol/L Final   Potassium 09/13/2022 4.5  3.5 - 5.1 mmol/L Final   Chloride 09/13/2022 105  98 - 111 mmol/L Final   CO2 09/13/2022 21 (L)  22 - 32 mmol/L Final   Glucose, Bld 09/13/2022 111 (H)  70 - 99 mg/dL Final   Glucose reference range applies only to samples taken after fasting for at least 8 hours.   BUN 09/13/2022 14  6 - 20 mg/dL Final   Creatinine, Ser 09/13/2022 0.92  0.44 - 1.00 mg/dL Final   Calcium 09/81/1914 9.1  8.9 - 10.3 mg/dL Final   GFR, Estimated 09/13/2022 >60  >60 mL/min Final   Comment: (NOTE) Calculated using the CKD-EPI Creatinine Equation (2021)    Anion gap 09/13/2022 10  5 - 15 Final   Performed at [facility], [address], [place], [place] [address]   Vitamin B-12 09/13/2022 585  180 - 914 pg/mL Final   Comment: (NOTE) This assay is not validated for testing neonatal or myeloproliferative syndrome specimens for Vitamin B12 levels. Performed at [facility], [address], [place], [place] [address]    Color, Urine 09/14/2022 YELLOW  YELLOW Final   APPearance 09/14/2022 HAZY (A)  CLEAR Final   Specific Gravity, Urine 09/14/2022 1.025  1.005 - 1.030 Final   pH 09/14/2022 5.0  5.0 - 8.0 Final   Glucose, UA 09/14/2022 NEGATIVE  NEGATIVE mg/dL Final   Hgb urine dipstick 09/14/2022 NEGATIVE  NEGATIVE Final   Bilirubin Urine 09/14/2022 NEGATIVE  NEGATIVE Final   Ketones, ur 09/14/2022 NEGATIVE  NEGATIVE mg/dL Final   Protein, ur 86/57/8469 NEGATIVE  NEGATIVE mg/dL Final   Nitrite 62/95/2841 NEGATIVE  NEGATIVE Final   Leukocytes,Ua 09/14/2022 TRACE (A)  NEGATIVE Final   RBC / HPF 09/14/2022 0-5  0 - 5 RBC/hpf Final   WBC, UA 09/14/2022 0-5  0 - 5 WBC/hpf Final   Bacteria, UA 09/14/2022 RARE (A)  NONE SEEN Final   Squamous Epithelial / LPF 09/14/2022 0-5  0 - 5 Final   Mucus 09/14/2022 PRESENT   Final   Performed at [facility], [address], [place], [place] [address]   Glucose-Capillary 09/16/2022 105 (H)  70 - 99 mg/dL Final   Glucose reference range applies only to samples taken after fasting for at least 8 hours.  Admission on 07/04/2022, Discharged on 07/05/2022  Component Date Value Ref Range Status   Sodium 07/04/2022 142  135 - 145 mmol/L Final   Potassium 07/04/2022 4.4  3.5 -  5.1 mmol/L Final   Chloride 07/04/2022 109  98 - 111 mmol/L Final   CO2 07/04/2022 26  22 - 32 mmol/L Final   Glucose, Bld 07/04/2022 96  70 - 99 mg/dL Final   Glucose reference range applies only to samples taken after fasting for at least 8 hours.   BUN 07/04/2022 15  6 - 20 mg/dL Final   Creatinine, Ser 07/04/2022 0.83  0.44 - 1.00 mg/dL Final   Calcium 16/07/9603 9.3  8.9 - 10.3 mg/dL Final   Total Protein 54/06/8118 7.2  6.5 - 8.1 g/dL Final   Albumin 14/78/2956 3.9  3.5 - 5.0 g/dL Final   AST 21/30/8657 23  15 - 41 U/L Final   ALT 07/04/2022 28  0 - 44 U/L Final   Alkaline Phosphatase 07/04/2022 77  38 - 126 U/L Final   Total  Bilirubin 07/04/2022 0.4  0.3 - 1.2 mg/dL Final   GFR, Estimated 07/04/2022 >60  >60 mL/min Final   Comment: (NOTE) Calculated using the CKD-EPI Creatinine Equation (2021)    Anion gap 07/04/2022 7  5 - 15 Final   Performed at [facility], [address], [place], [place] [address]   Alcohol, Ethyl (B) 07/04/2022 <10  <10 mg/dL Final   Comment: (NOTE) Lowest detectable limit for serum alcohol is 10 mg/dL.  For medical purposes only. Performed at [facility], [address] [place], [place] [address]    WBC 07/04/2022 7.0  4.0 - 10.5 K/uL Final   RBC 07/04/2022 4.18  3.87 - 5.11 MIL/uL Final   Hemoglobin 07/04/2022 12.8  12.0 - 15.0 g/dL Final   HCT 41/32/4401 39.1  36.0 - 46.0 % Final   MCV 07/04/2022 93.5  80.0 - 100.0 fL Final   MCH 07/04/2022 30.6  26.0 - 34.0 pg Final   MCHC 07/04/2022 32.7  30.0 - 36.0 g/dL Final   RDW 02/72/5366 12.9  11.5 - 15.5 % Final   Platelets 07/04/2022 243  150 - 400 K/uL Final   nRBC 07/04/2022 0.0  0.0 - 0.2 % Final   Neutrophils Relative % 07/04/2022 43  % Final   Neutro Abs 07/04/2022 3.0  1.7 - 7.7 K/uL Final   Lymphocytes Relative 07/04/2022 50  % Final   Lymphs Abs 07/04/2022 3.5  0.7 - 4.0 K/uL Final   Monocytes Relative 07/04/2022 7  % Final   Monocytes Absolute 07/04/2022 0.5  0.1 - 1.0 K/uL Final   Eosinophils Relative 07/04/2022 0  % Final   Eosinophils Absolute 07/04/2022 0.0  0.0 - 0.5 K/uL Final   Basophils Relative 07/04/2022 0  % Final   Basophils Absolute 07/04/2022 0.0  0.0 - 0.1 K/uL Final   Immature Granulocytes 07/04/2022 0  % Final   Abs Immature Granulocytes 07/04/2022 0.01  0.00 - 0.07 K/uL Final   Performed at [facility], [address], [place], [place] [address]   I-stat hCG, quantitative 07/04/2022 <5.0  <5 mIU/mL Final   Comment 3 07/04/2022          Final   Comment:   GEST. AGE      CONC.  (mIU/mL)   <=1 WEEK        5 - 50     2 WEEKS       50 - 500      3 WEEKS       100 - 10,000     4 WEEKS     1,000 -  30,000        FEMALE AND NON-PREGNANT FEMALE:     LESS THAN 5 mIU/mL   Admission on 06/14/2022, Discharged on 06/14/2022  Component Date Value Ref Range Status   Color, UA 06/14/2022 yellow  yellow Final   Clarity, UA 06/14/2022 cloudy (A)  clear Final   Glucose, UA 06/14/2022 negative  negative mg/dL Final   Bilirubin, UA 52/84/1324 negative  negative Final   Ketones, POC UA 06/14/2022 negative  negative mg/dL Final   Spec Grav, UA 40/07/2724 1.025  1.010 - 1.025 Final   Blood, UA 06/14/2022 negative  negative Final   pH, UA 06/14/2022 7.5  5.0 - 8.0 Final   Protein Ur, POC 06/14/2022 =30 (A)  negative mg/dL Final   Urobilinogen, UA 06/14/2022 0.2  0.2 or 1.0 E.U./dL Final   Nitrite, UA 36/64/4034 Negative  Negative Final   Leukocytes, UA 06/14/2022 Negative  Negative Final   Preg Test, Ur 06/14/2022 Negative  Negative Final   Specimen Description 06/14/2022 URINE, CLEAN CATCH   Final   Special Requests 06/14/2022    Final                   Value:NONE Performed at [facility], [address] [place], [place] [address]    Culture 06/14/2022 MULTIPLE SPECIES PRESENT, SUGGEST RECOLLECTION (A)   Final   Report Status 06/14/2022 06/16/2022 FINAL   Final  Admission on 06/07/2022, Discharged on 06/10/2022  Component Date Value Ref Range Status   SARS Coronavirus 2 by RT PCR 06/07/2022 NEGATIVE  NEGATIVE Final   Comment: (NOTE) SARS-CoV-2 target nucleic acids are NOT DETECTED.  The SARS-CoV-2 RNA is generally detectable in upper respiratory specimens during the acute phase of infection. The lowest concentration of SARS-CoV-2 viral copies this assay can detect is 138 copies/mL. A negative result does not preclude SARS-Cov-2 infection and should not be used as the sole basis for treatment or other patient management decisions. A negative result may occur with  improper specimen collection/handling, submission of specimen  other than nasopharyngeal swab, presence of viral mutation(s) within the areas targeted by this assay, and inadequate number of viral copies(<138 copies/mL). A negative result must be combined with clinical observations, patient history, and epidemiological information. The expected result is Negative.  Fact Sheet for Patients:  BloggerCourse.com  Fact Sheet for Healthcare Providers:  SeriousBroker.it  This test is no                          t yet approved or cleared by the [place] FDA and  has been authorized for detection and/or diagnosis of SARS-CoV-2 by FDA under an Emergency Use Authorization (EUA). This EUA will remain  in effect (meaning this test can be used) for the duration of the COVID-19 declaration under Section 564(b)(1) of the Act, 21 U.S.C.section 360bbb-3(b)(1), unless the authorization is terminated  or revoked sooner.       Influenza A by PCR 06/07/2022 NEGATIVE  NEGATIVE Final   Influenza B by PCR 06/07/2022 NEGATIVE  NEGATIVE Final   Comment: (NOTE) The Xpert Xpress SARS-CoV-2/FLU/RSV plus assay is intended as an aid in the diagnosis of influenza from Nasopharyngeal swab specimens and should not be used as a sole basis for treatment. Nasal washings and aspirates are unacceptable for Xpert Xpress SARS-CoV-2/FLU/RSV testing.  Fact Sheet for Patients: BloggerCourse.com  Fact Sheet for Healthcare Providers: SeriousBroker.it  This test is not yet approved or cleared by the  [place] [employer] and has been authorized for detection and/or diagnosis of SARS-CoV-2 by FDA under an TEFL teacher (EUA). This EUA will remain in effect (meaning this test can be used) for the duration of the COVID-19 declaration under Section 564(b)(1) of the Act, 21 U.S.C. section 360bbb-3(b)(1), unless the authorization is terminated or revoked.  Performed at  [facility], [address] [place], [place] [address]    WBC 06/07/2022 5.7  4.0 - 10.5 K/uL Final   RBC 06/07/2022 4.39  3.87 - 5.11 MIL/uL Final   Hemoglobin 06/07/2022 13.2  12.0 - 15.0 g/dL Final   HCT 01/56/1537 40.4  36.0 - 46.0 % Final   MCV 06/07/2022 92.0  80.0 - 100.0 fL Final   MCH 06/07/2022 30.1  26.0 - 34.0 pg Final   MCHC 06/07/2022 32.7  30.0 - 36.0 g/dL Final   RDW 94/32/7614 12.4  11.5 - 15.5 % Final   Platelets 06/07/2022 308  150 - 400 K/uL Final   nRBC 06/07/2022 0.0  0.0 - 0.2 % Final   Neutrophils Relative % 06/07/2022 42  % Final   Neutro Abs 06/07/2022 2.4  1.7 - 7.7 K/uL Final   Lymphocytes Relative 06/07/2022 54  % Final   Lymphs Abs 06/07/2022 3.1  0.7 - 4.0 K/uL Final   Monocytes Relative 06/07/2022 4  % Final   Monocytes Absolute 06/07/2022 0.2  0.1 - 1.0 K/uL Final   Eosinophils Relative 06/07/2022 0  % Final   Eosinophils Absolute 06/07/2022 0.0  0.0 - 0.5 K/uL Final   Basophils Relative 06/07/2022 0  % Final   Basophils Absolute 06/07/2022 0.0  0.0 - 0.1 K/uL Final   Immature Granulocytes 06/07/2022 0  % Final   Abs Immature Granulocytes 06/07/2022 0.01  0.00 - 0.07 K/uL Final   Performed at [facility], [address] [place], [place] [address]   Sodium 06/07/2022 139  135 - 145 mmol/L Final   Potassium 06/07/2022 4.0  3.5 - 5.1 mmol/L Final   Chloride 06/07/2022 104  98 - 111 mmol/L Final   CO2 06/07/2022 28  22 - 32 mmol/L Final   Glucose, Bld 06/07/2022 104 (H)  70 - 99 mg/dL Final   Glucose reference range applies only to samples taken after fasting for at least 8 hours.   BUN 06/07/2022 8  6 - 20 mg/dL Final   Creatinine, Ser 06/07/2022 0.84  0.44 - 1.00 mg/dL Final   Calcium 57/47/3403 9.1  8.9 - 10.3 mg/dL Final   Total Protein 70/96/4383 6.9  6.5 - 8.1 g/dL Final   Albumin 81/84/0375 3.7  3.5 - 5.0 g/dL Final   AST 43/60/6770 19  15 - 41 U/L Final   ALT 06/07/2022 22  0 - 44 U/L Final   Alkaline Phosphatase 06/07/2022  54  38 - 126 U/L Final   Total Bilirubin 06/07/2022 0.4  0.3 - 1.2 mg/dL Final   GFR, Estimated 06/07/2022 >60  >60 mL/min Final   Comment: (NOTE) Calculated using the CKD-EPI Creatinine Equation (2021)    Anion gap 06/07/2022 7  5 - 15 Final   Performed at [facility], [address] [place], [place] [address]   Hgb A1c MFr Bld 06/07/2022 5.0  4.8 - 5.6 % Final   Comment: (NOTE) Pre diabetes:          5.7%-6.4%  Diabetes:              >6.4%  Glycemic  control for   <7.0% adults with diabetes    Mean Plasma Glucose 06/07/2022 96.8  mg/dL Final   Performed at [facility], [address] [place], [place] [address]   TSH 06/07/2022 1.620  0.350 - 4.500 uIU/mL Final   Comment: Performed by a 3rd Generation assay with a functional sensitivity of <=0.01 uIU/mL. Performed at [facility], [address], [place], [place] [address]    RPR Ser Ql 06/07/2022 NON REACTIVE  NON REACTIVE Final   Performed at [facility], [address], [place], [place] [address]   Color, Urine 06/07/2022 YELLOW  YELLOW Final   APPearance 06/07/2022 HAZY (A)  CLEAR Final   Specific Gravity, Urine 06/07/2022 1.018  1.005 - 1.030 Final   pH 06/07/2022 7.0  5.0 - 8.0 Final   Glucose, UA 06/07/2022 NEGATIVE  NEGATIVE mg/dL Final   Hgb urine dipstick 06/07/2022 NEGATIVE  NEGATIVE Final   Bilirubin Urine 06/07/2022 NEGATIVE  NEGATIVE Final   Ketones, ur 06/07/2022 NEGATIVE  NEGATIVE mg/dL Final   Protein, ur 91/47/8295 NEGATIVE  NEGATIVE mg/dL Final   Nitrite 62/13/0865 NEGATIVE  NEGATIVE Final   Leukocytes,Ua 06/07/2022 NEGATIVE  NEGATIVE Final   Performed at [facility], [address], [place], [place] [address]   Cholesterol 06/07/2022 164  0 - 200 mg/dL Final   Triglycerides 62/95/2841 158 (H)  <150 mg/dL Final   HDL 32/44/0102 44  >40 mg/dL Final   Total CHOL/HDL Ratio 06/07/2022 3.7  RATIO Final   VLDL 06/07/2022 32  0 - 40 mg/dL Final   LDL Cholesterol 06/07/2022 88   0 - 99 mg/dL Final   Comment:        Total Cholesterol/HDL:CHD Risk Coronary Heart Disease Risk Table                     Men   Women  1/2 Average Risk   3.4   3.3  Average Risk       5.0   4.4  2 X Average Risk   9.6   7.1  3 X Average Risk  23.4   11.0        Use the calculated Patient Ratio above and the CHD Risk Table to determine the patient's CHD Risk.        ATP III CLASSIFICATION (LDL):  <100     mg/dL   Optimal  725-366  mg/dL   Near or Above                    Optimal  130-159  mg/dL   Borderline  440-347  mg/dL   High  >425     mg/dL   Very High Performed at [facility], [address] [place], [place] [address]    HIV Screen 4th Generation wRfx 06/07/2022 Non Reactive  Non Reactive Final   Performed at [facility], [address], [place], [place] [address]   SARSCOV2ONAVIRUS 2 AG 06/07/2022 NEGATIVE  NEGATIVE Final   Comment: (NOTE) SARS-CoV-2 antigen NOT DETECTED.   Negative results are presumptive.  Negative results do not preclude SARS-CoV-2 infection and should not be used as the sole basis for treatment or other patient management decisions, including infection  control decisions, particularly in the presence of clinical signs and  symptoms consistent with COVID-19, or in those who have been in contact with the virus.  Negative results must be combined with clinical observations,  patient history, and epidemiological information. The expected result is Negative.  Fact Sheet for Patients: https://www.jennings-kim.com/https://www.fda.gov/media/141569/download  Fact Sheet for Healthcare Providers: https://alexander-rogers.biz/https://www.fda.gov/media/141568/download  This test is not yet approved or cleared by the [place] FDA and  has been authorized for detection and/or diagnosis of SARS-CoV-2 by FDA under an Emergency Use Authorization (EUA).  This EUA will remain in effect (meaning this test can be used) for the duration of  the COV                          ID-19 declaration under  Section 564(b)(1) of the Act, 21 U.S.C. section 360bbb-3(b)(1), unless the authorization is terminated or revoked sooner.     POC Amphetamine UR 06/07/2022 None Detected  NONE DETECTED (Cut Off Level 1000 ng/mL) Final   POC Secobarbital (BAR) 06/07/2022 None Detected  NONE DETECTED (Cut Off Level 300 ng/mL) Final   POC Buprenorphine (BUP) 06/07/2022 None Detected  NONE DETECTED (Cut Off Level 10 ng/mL) Final   POC Oxazepam (BZO) 06/07/2022 None Detected  NONE DETECTED (Cut Off Level 300 ng/mL) Final   POC Cocaine UR 06/07/2022 None Detected  NONE DETECTED (Cut Off Level 300 ng/mL) Final   POC Methamphetamine UR 06/07/2022 None Detected  NONE DETECTED (Cut Off Level 1000 ng/mL) Final   POC Morphine 06/07/2022 None Detected  NONE DETECTED (Cut Off Level 300 ng/mL) Final   POC Methadone UR 06/07/2022 None Detected  NONE DETECTED (Cut Off Level 300 ng/mL) Final   POC Oxycodone UR 06/07/2022 None Detected  NONE DETECTED (Cut Off Level 100 ng/mL) Final   POC Marijuana UR 06/07/2022 None Detected  NONE DETECTED (Cut Off Level 50 ng/mL) Final   Preg Test, Ur 06/08/2022 NEGATIVE  NEGATIVE Final   Comment:        THE SENSITIVITY OF THIS METHODOLOGY IS >20 mIU/mL. Performed at [facility], [address] [place], [place] [id number]    Preg Test, Ur 06/08/2022 NEGATIVE  NEGATIVE Final   Comment:        THE SENSITIVITY OF THIS METHODOLOGY IS >24 mIU/mL     Blood Alcohol level:  Lab Results  Component Value Date   ETH <10 07/04/2022   ETH <10 02/07/2022    Metabolic Disorder Labs: Lab Results  Component Value Date   HGBA1C 5.0 06/07/2022   MPG 96.8 06/07/2022   MPG 96.8 02/07/2022   No results found for: "PROLACTIN" Lab Results  Component Value Date   CHOL 181 07/25/2022   TRIG 118 07/25/2022   HDL 45 07/25/2022   CHOLHDL 4.0 07/25/2022   VLDL 24 07/25/2022   LDLCALC 112 (H) 07/25/2022   LDLCALC 88 06/07/2022    Therapeutic Lab Levels: No results found for:  "LITHIUM" No results found for: "VALPROATE" No results found for: "CBMZ"  Physical Findings   GAD-7    Flowsheet Row [facility]  Total GAD-7 Score 17      PHQ2-9    Flowsheet Row ED [facility]/07/2022 [facility]  PHQ-2 Total Score 1 2  PHQ-9 Total Score 2 8      Flowsheet Row [facility]   C-SSRS RISK CATEGORY Error: Q3, 4, or  5 should not be populated when Q2 is No No Risk No Risk        Musculoskeletal  Strength & Muscle Tone: within normal limits Gait & Station: normal Patient leans: N/A  Psychiatric Specialty Exam  Presentation  General Appearance:  Appropriate for Environment  Eye Contact: Fair  Speech: Clear and Coherent  Speech Volume: Normal  Handedness: Right   Mood and Affect  Mood: Euthymic  Affect: Blunt   Thought Process  Thought Processes: Coherent; Goal Directed  Descriptions of Associations:Intact  Orientation:Full (Time, Place and Person)  Thought Content:Logical  Diagnosis of Schizophrenia or Schizoaffective disorder in past: Yes  Duration of Psychotic Symptoms: Greater than six months   Hallucinations:Hallucinations: None  Ideas of Reference:None  Suicidal Thoughts:Suicidal Thoughts: No  Homicidal Thoughts:Homicidal Thoughts: No   Sensorium  Memory: Immediate Fair  Judgment: Fair  Insight: Fair   Art therapist  Concentration: Fair  Attention Span: Fair  Recall: [employer] of Knowledge: Fair  Language: Fair   Psychomotor Activity  Psychomotor Activity: Psychomotor Activity: Normal   Assets  Assets: Communication Skills; Desire for Improvement;  Financial Resources/Insurance   Sleep  Sleep: Sleep: Fair   No data recorded  Physical Exam  Physical Exam Constitutional:      General: She is not in acute distress.    Appearance: She is not ill-appearing, toxic-appearing or diaphoretic.  Eyes:     General: No scleral icterus. Cardiovascular:     Rate and Rhythm: Normal rate.  Pulmonary:     Effort: Pulmonary effort is normal. No respiratory distress.  Neurological:     Mental Status: She is alert and oriented to person, place, and time.  Psychiatric:        Attention and Perception: Attention and perception normal.        Mood and Affect: Mood normal. Affect is blunt.        Speech: Speech normal.        Behavior: Behavior normal. Behavior is cooperative.        Thought Content: Thought content normal.    Review of Systems  Constitutional:  Negative for chills and fever.  Respiratory:  Negative for shortness of breath.   Cardiovascular:  Negative for chest pain and palpitations.  Neurological:  Negative for headaches.   Blood pressure 111/76, pulse 98, temperature 98.1 F (36.7 C), temperature source Oral, resp. rate 16, SpO2 100 %. There is no height or weight on file to calculate BMI.  Treatment Plan Summary: Plan Pt remains psychiatrically cleared. Disposition is pending placement.  Lauree Chandler, NP 09/22/2022 10:33 AM

## 2022-09-22 NOTE — ED Notes (Signed)
Patient alert and oriented x 3. Denies SI/HI/AVH. Denies intent or plan to harm self or others. Routine conducted according to faculty protocol. Encourage patient to notify staff with any needs or concerns. Patient verbalized agreement and understanding. Patient is currently writing . Will continue to monitor for safety.

## 2022-09-22 NOTE — ED Notes (Signed)
Patient is outside with staff.  

## 2022-09-22 NOTE — ED Notes (Addendum)
Patient observed/assessed in bed/chair resting quietly appearing in no distress and verbalizing no complaints at this time. Will continue to monitor.  

## 2022-09-22 NOTE — ED Notes (Signed)
Patient alert, oriented x 4, calm and cooperative. Patient given snacks now eating while watching television and interacting with peers. Will continue to monitor for safety.

## 2022-09-22 NOTE — ED Notes (Signed)
Patient observed/assessed in bed/chair resting quietly appearing in no distress and verbalizing no complaints at this time. Will continue to monitor.  

## 2022-09-22 NOTE — ED Notes (Signed)
Patient alert and oriented x 3. Denies SI/HI/AVH. Denies intent or plan to harm self or others. Routine conducted according to faculty protocol. Encourage patient to notify staff with any needs or concerns. Patient verbalized agreement and understanding. Will continue to monitor for safety. 

## 2022-09-22 NOTE — ED Notes (Signed)
[name]&o x3. Denies SI/HI/AVH..Denies intent or plan  to harm self or others. Routine conducted according to faculty protocol .Encourge patient to notify  staff with any needs or concerns. Patient verbalized agreement and understanding .Will continue to monitor for safety. 

## 2022-09-23 ENCOUNTER — Encounter (HOSPITAL_COMMUNITY): Payer: Self-pay | Admitting: Registered Nurse

## 2022-09-23 NOTE — ED Notes (Signed)
Patient resting in bed, with eyes closed. Rise and fall of chest noted with audible snoring. No acute distress noted at this time.

## 2022-09-23 NOTE — ED Provider Notes (Signed)
Behavioral Health Progress Note  Date and Time: 09/23/2022 1:09 PM Name: Nicole [name] MRN:  [id number]  Subjective:   Nicole [name] is a 31 y.o. female, with PMH of SCZ unspecified, MDD with psychosis, who presented voluntary to [facility] (07/25/2022) via [facility] after a verbal altercation with [name] (not patient's legal guardian) for transient SI.  This is patient's fourth visit to [facility] for similar concerns this year. Patient has been dismissed from previous group home due to threatening SI and HI, then leaving the group home.   Legal Guardian: [name]  [name], 44 y.o., female patient seen face to face by this provider, consulted with Dr. Nelly Rout; and chart reviewed on 09/23/22.  On evaluation Nicole [name] reports nothing has changed.   She denies suicidal/self-harm/homicidal ideation, psychosis, and paranoia.  States she is eating and sleeping without difficulty, denies constipation at this time.  States she is taking medications as prescribed with out adverse reaction.  She does want to know if she has been accepted to the last place she interviewed with.  States no one has told her anything.  Informed would have social work come speak with her.     Diagnosis:  Final diagnoses:  Severe episode of recurrent major depressive disorder, with psychotic features (HCC)    Total Time spent with patient: 15 minutes  Past Psychiatric History: see HPI Past Medical History:  Past Medical History:  Diagnosis Date   Anxiety    Depression    Hypothyroidism 08/07/2022   Tobacco use disorder 08/07/2022    Past Surgical History:  Procedure Laterality Date   WISDOM TOOTH EXTRACTION Bilateral 2020   Family History:  Family History  Problem Relation Age of Onset   Hypertension Father    Diabetes Father    Family Psychiatric  History: None reported Social History:  Social History    Substance and Sexual Activity  Alcohol Use Never     Social History   Substance and Sexual Activity  Drug Use Never    Social History   Socioeconomic History   Marital status: Single    Spouse name: Not on file   Number of children: Not on file   Years of education: Not on file   Highest education level: Not on file  Occupational History   Not on file  Tobacco Use   Smoking status: Every Day    Types: Cigarettes   Smokeless tobacco: Never  Vaping Use   Vaping Use: Some days  Substance and Sexual Activity   Alcohol use: Never   Drug use: Never   Sexual activity: Yes    Partners: Female    Birth control/protection: Implant  Other Topics Concern   Not on file  Social History Narrative   Not on file   Social Determinants of Health   Financial Resource Strain: Not on file  Food Insecurity: Not on file  Transportation Needs: Not on file  Physical Activity: Not on file  Stress: Not on file  Social Connections: Not on file   SDOH:  SDOH Screenings   Depression (PHQ2-9): Low Risk  (06/10/2022)  Tobacco Use: High Risk (09/23/2022)   Additional Social History:    Pain Medications: SEE MAR Prescriptions: SEE MAR Over the Counter: SEE MAR History of alcohol / drug use?: Yes Longest period of sobriety (when/how long): N/A Negative Consequences of Use:  (NONE) Withdrawal Symptoms: None Name of Substance 1: ETOH IN  PAST--PT CURRENTLY DENIES 1 - Frequency: SOCIAL USE ETOH IN PAST 1 - Method of Aquiring: LEGAL 1- Route of Use: ORAL DRINK Name of Substance 2: NICOTINE-VAPE AND CIGARETTES 2 - Amount (size/oz): LESS THAN ONE PACK 2 - Frequency: DAILY 2 - Method of Aquiring: LEGAL 2 - Route of Substance Use: ORAL SMOKE                Sleep: Good  Appetite:  Good  Current Medications:  Current Facility-Administered Medications  Medication Dose Route Frequency Provider Last Rate Last Admin   acetaminophen (TYLENOL) tablet 650 mg  650 mg Oral Q6H PRN  Onuoha, Chinwendu V, NP   650 mg at 09/21/22 2200   ARIPiprazole ER (ABILIFY MAINTENA) injection 400 mg  400 mg Intramuscular Q28 days Princess Bruins, DO   400 mg at 08/30/22 1338   docusate sodium (COLACE) capsule 100 mg  100 mg Oral Daily [name], MD   100 mg at 09/23/22 0839   hydrOXYzine (ATARAX) tablet 25 mg  25 mg Oral TID PRN Onuoha, Chinwendu V, NP   25 mg at 09/22/22 2204   ibuprofen (ADVIL) tablet 400 mg  400 mg Oral Q6H PRN Onuoha, Chinwendu V, NP   400 mg at 09/20/22 2115   levothyroxine (SYNTHROID) tablet 100 mcg  100 mcg Oral Q0600 Princess Bruins, DO   100 mcg at 09/23/22 0553   loratadine (CLARITIN) tablet 10 mg  10 mg Oral Daily Shanetra Blumenstock B, NP   10 mg at 09/23/22 0839   metFORMIN (GLUCOPHAGE) tablet 500 mg  500 mg Oral Q breakfast Park Pope, MD   500 mg at 09/23/22 0839   nicotine (NICODERM CQ - dosed in mg/24 hours) patch 14 mg  14 mg Transdermal Q0600 Onuoha, Chinwendu V, NP   14 mg at 09/23/22 0848   ondansetron (ZOFRAN-ODT) disintegrating tablet 4 mg  4 mg Oral Q8H PRN Sindy Guadeloupe, NP   4 mg at 09/14/22 2351   Oxcarbazepine (TRILEPTAL) tablet 300 mg  300 mg Oral BID Onuoha, Chinwendu V, NP   300 mg at 09/23/22 0839   pantoprazole (PROTONIX) EC tablet 20 mg  20 mg Oral Daily Princess Bruins, DO   20 mg at 09/23/22 0839   QUEtiapine (SEROQUEL) tablet 400 mg  400 mg Oral BID Onuoha, Chinwendu V, NP   400 mg at 09/23/22 0839   sertraline (ZOLOFT) tablet 150 mg  150 mg Oral Daily Onuoha, Chinwendu V, NP   150 mg at 09/23/22 0839   traZODone (DESYREL) tablet 100 mg  100 mg Oral QHS Ardis Hughs, NP   100 mg at 09/22/22 2204   valACYclovir (VALTREX) tablet 500 mg  500 mg Oral Daily Princess Bruins, DO   500 mg at 09/23/22 0839   ziprasidone (GEODON) injection 20 mg  20 mg Intramuscular Q12H PRN Onuoha, Chinwendu V, NP   20 mg at 09/19/22 2133   Current Outpatient Medications  Medication Sig Dispense Refill   ABILIFY MAINTENA 400 MG PRSY prefilled syringe 400 mg every  28 (twenty-eight) days.     cetirizine (ZYRTEC) 10 MG tablet Take 10 mg by mouth daily.     cyclobenzaprine (FLEXERIL) 10 MG tablet Take 1 tablet (10 mg total) by mouth 2 (two) times daily as needed for muscle spasms. 20 tablet 0   fluticasone (FLONASE) 50 MCG/ACT nasal spray Place 1 spray into both nostrils daily.     meloxicam (MOBIC) 15 MG tablet Take 15 mg by mouth daily.  Oxcarbazepine (TRILEPTAL) 300 MG tablet Take 1 tablet (300 mg total) by mouth 2 (two) times daily. 60 tablet 0   QUEtiapine (SEROQUEL) 400 MG tablet Take 1 tablet (400 mg total) by mouth 2 (two) times daily. 60 tablet 0   sertraline (ZOLOFT) 50 MG tablet Take 3 tablets (150 mg total) by mouth in the morning. 90 tablet 0   traZODone (DESYREL) 100 MG tablet Take 1 tablet (100 mg total) by mouth at bedtime. 30 tablet 0   valACYclovir (VALTREX) 500 MG tablet Take 500 mg by mouth daily.      Labs  Lab Results:  Admission on 07/25/2022  Component Date Value Ref Range Status   SARS Coronavirus 2 by RT PCR 07/25/2022 NEGATIVE  NEGATIVE Final   Comment: (NOTE) SARS-CoV-2 target nucleic acids are NOT DETECTED.  The SARS-CoV-2 RNA is generally detectable in upper respiratory specimens during the acute phase of infection. The lowest concentration of SARS-CoV-2 viral copies this assay can detect is 138 copies/mL. A negative result does not preclude SARS-Cov-2 infection and should not be used as the sole basis for treatment or other patient management decisions. A negative result may occur with  improper specimen collection/handling, submission of specimen other than nasopharyngeal swab, presence of viral mutation(s) within the areas targeted by this assay, and inadequate number of viral copies(<138 copies/mL). A negative result must be combined with clinical observations, patient history, and epidemiological information. The expected result is Negative.  Fact Sheet for Patients:   BloggerCourse.com  Fact Sheet for Healthcare Providers:  SeriousBroker.it  This test is no                          t yet approved or cleared by the [place] FDA and  has been authorized for detection and/or diagnosis of SARS-CoV-2 by FDA under an Emergency Use Authorization (EUA). This EUA will remain  in effect (meaning this test can be used) for the duration of the COVID-19 declaration under Section 564(b)(1) of the Act, 21 U.S.C.section 360bbb-3(b)(1), unless the authorization is terminated  or revoked sooner.       Influenza A by PCR 07/25/2022 NEGATIVE  NEGATIVE Final   Influenza B by PCR 07/25/2022 NEGATIVE  NEGATIVE Final   Comment: (NOTE) The Xpert Xpress SARS-CoV-2/FLU/RSV plus assay is intended as an aid in the diagnosis of influenza from Nasopharyngeal swab specimens and should not be used as a sole basis for treatment. Nasal washings and aspirates are unacceptable for Xpert Xpress SARS-CoV-2/FLU/RSV testing.  Fact Sheet for Patients: BloggerCourse.com  Fact Sheet for Healthcare Providers: SeriousBroker.it  This test is not yet approved or cleared by the [place] FDA and has been authorized for detection and/or diagnosis of SARS-CoV-2 by FDA under an Emergency Use Authorization (EUA). This EUA will remain in effect (meaning this test can be used) for the duration of the COVID-19 declaration under Section 564(b)(1) of the Act, 21 U.S.C. section 360bbb-3(b)(1), unless the authorization is terminated or revoked.  Performed at [facility], [address] [place], [place] [address]    WBC 07/25/2022 8.3  4.0 - 10.5 K/uL Final   RBC 07/25/2022 4.44  3.87 - 5.11 MIL/uL Final   Hemoglobin 07/25/2022 13.7  12.0 - 15.0 g/dL Final   HCT 64/33/2951 40.2  36.0 - 46.0 % Final   MCV 07/25/2022 90.5  80.0 - 100.0 fL Final   MCH 07/25/2022 30.9  26.0 - 34.0  pg Final   MCHC  07/25/2022 34.1  30.0 - 36.0 g/dL Final   RDW 11/91/4782 12.2  11.5 - 15.5 % Final   Platelets 07/25/2022 248  150 - 400 K/uL Final   nRBC 07/25/2022 0.0  0.0 - 0.2 % Final   Neutrophils Relative % 07/25/2022 43  % Final   Neutro Abs 07/25/2022 3.6  1.7 - 7.7 K/uL Final   Lymphocytes Relative 07/25/2022 52  % Final   Lymphs Abs 07/25/2022 4.2 (H)  0.7 - 4.0 K/uL Final   Monocytes Relative 07/25/2022 5  % Final   Monocytes Absolute 07/25/2022 0.4  0.1 - 1.0 K/uL Final   Eosinophils Relative 07/25/2022 0  % Final   Eosinophils Absolute 07/25/2022 0.0  0.0 - 0.5 K/uL Final   Basophils Relative 07/25/2022 0  % Final   Basophils Absolute 07/25/2022 0.0  0.0 - 0.1 K/uL Final   Immature Granulocytes 07/25/2022 0  % Final   Abs Immature Granulocytes 07/25/2022 0.02  0.00 - 0.07 K/uL Final   Performed at [facility], [address] [place], [place] [address]   Sodium 07/25/2022 138  135 - 145 mmol/L Final   Potassium 07/25/2022 4.0  3.5 - 5.1 mmol/L Final   Chloride 07/25/2022 104  98 - 111 mmol/L Final   CO2 07/25/2022 29  22 - 32 mmol/L Final   Glucose, Bld 07/25/2022 83  70 - 99 mg/dL Final   Glucose reference range applies only to samples taken after fasting for at least 8 hours.   BUN 07/25/2022 11  6 - 20 mg/dL Final   Creatinine, Ser 07/25/2022 0.97  0.44 - 1.00 mg/dL Final   Calcium 30/86/5784 9.2  8.9 - 10.3 mg/dL Final   Total Protein 69/62/9528 7.0  6.5 - 8.1 g/dL Final   Albumin 41/32/4401 3.8  3.5 - 5.0 g/dL Final   AST 02/72/5366 18  15 - 41 U/L Final   ALT 07/25/2022 22  0 - 44 U/L Final   Alkaline Phosphatase 07/25/2022 64  38 - 126 U/L Final   Total Bilirubin 07/25/2022 0.2 (L)  0.3 - 1.2 mg/dL Final   GFR, Estimated 07/25/2022 >60  >60 mL/min Final   Comment: (NOTE) Calculated using the CKD-EPI Creatinine Equation (2021)    Anion gap 07/25/2022 5  5 - 15 Final   Performed at [facility], [address] [place], [place] [address]   POC  Amphetamine UR 07/25/2022 None Detected  NONE DETECTED (Cut Off Level 1000 ng/mL) Preliminary   POC Secobarbital (BAR) 07/25/2022 None Detected  NONE DETECTED (Cut Off Level 300 ng/mL) Preliminary   POC Buprenorphine (BUP) 07/25/2022 None Detected  NONE DETECTED (Cut Off Level 10 ng/mL) Preliminary   POC Oxazepam (BZO) 07/25/2022 None Detected  NONE DETECTED (Cut Off Level 300 ng/mL) Preliminary   POC Cocaine UR 07/25/2022 None Detected  NONE DETECTED (Cut Off Level 300 ng/mL) Preliminary   POC Methamphetamine UR 07/25/2022 None Detected  NONE DETECTED (Cut Off Level 1000 ng/mL) Preliminary   POC Morphine 07/25/2022 None Detected  NONE DETECTED (Cut Off Level 300 ng/mL) Preliminary   POC Methadone UR 07/25/2022 None Detected  NONE DETECTED (Cut Off Level 300 ng/mL) Preliminary   POC Oxycodone UR 07/25/2022 None Detected  NONE DETECTED (Cut Off Level 100 ng/mL) Preliminary   POC Marijuana UR 07/25/2022 None Detected  NONE DETECTED (Cut Off Level 50 ng/mL) Preliminary   SARSCOV2ONAVIRUS 2 AG 07/25/2022 NEGATIVE  NEGATIVE Final   Comment: (NOTE) SARS-CoV-2 antigen NOT DETECTED.  Negative results are presumptive.  Negative results do not preclude SARS-CoV-2 infection and should not be used as the sole basis for treatment or other patient management decisions, including infection  control decisions, particularly in the presence of clinical signs and  symptoms consistent with COVID-19, or in those who have been in contact with the virus.  Negative results must be combined with clinical observations, patient history, and epidemiological information. The expected result is Negative.  Fact Sheet for Patients: https://www.jennings-kim.com/https://www.fda.gov/media/141569/download  Fact Sheet for Healthcare Providers: https://alexander-rogers.biz/https://www.fda.gov/media/141568/download  This test is not yet approved or cleared by the [place] FDA and  has been authorized for detection and/or diagnosis of SARS-CoV-2 by FDA under an Emergency Use  Authorization (EUA).  This EUA will remain in effect (meaning this test can be used) for the duration of  the COV                          ID-19 declaration under Section 564(b)(1) of the Act, 21 U.S.C. section 360bbb-3(b)(1), unless the authorization is terminated or revoked sooner.     Cholesterol 07/25/2022 181  0 - 200 mg/dL Final   Triglycerides 16/10/960410/09/2022 118  <150 mg/dL Final   HDL 54/09/811910/09/2022 45  >40 mg/dL Final   Total CHOL/HDL Ratio 07/25/2022 4.0  RATIO Final   VLDL 07/25/2022 24  0 - 40 mg/dL Final   LDL Cholesterol 07/25/2022 112 (H)  0 - 99 mg/dL Final   Comment:        Total Cholesterol/HDL:CHD Risk Coronary Heart Disease Risk Table                     Men   Women  1/2 Average Risk   3.4   3.3  Average Risk       5.0   4.4  2 X Average Risk   9.6   7.1  3 X Average Risk  23.4   11.0        Use the calculated Patient Ratio above and the CHD Risk Table to determine the patient's CHD Risk.        ATP III CLASSIFICATION (LDL):  <100     mg/dL   Optimal  [id number]  mg/dL   Near or Above                    Optimal  130-159  mg/dL   Borderline  [id number]  mg/dL   High  >865>190     mg/dL   Very High Performed at [facility], [address] [place], [place] [address]    TSH 07/25/2022 6.668 (H)  0.350 - 4.500 uIU/mL Final   Comment: Performed by a 3rd Generation assay with a functional sensitivity of <=0.01 uIU/mL. Performed at [facility], [address] [place] [address] [place], [place] [address]    Glucose-Capillary 07/26/2022 104 (H)  70 - 99 mg/dL Final   Glucose reference range applies only to samples taken after fasting for at least 8 hours.   T3, Free 07/31/2022 2.3  2.0 - 4.4 pg/mL Final   Comment: (NOTE) Performed At: [facility]  [address] [place], [place] [id number] Jolene SchimkeNagendra Sanjai MD UU:[id number]:[phone]    Free T4 07/31/2022 0.60 (L)  0.61 - 1.12 ng/dL Final   Comment: (NOTE) Biotin ingestion may interfere with free T4 tests. If the results  are inconsistent with the TSH level, previous test results, or the clinical presentation, then consider biotin  interference. If needed, order repeat testing after stopping biotin. Performed at [facility], [address], [place], [place] [address]    Glucose-Capillary 08/29/2022 100 (H)  70 - 99 mg/dL Final   Glucose reference range applies only to samples taken after fasting for at least 8 hours.   TSH 09/05/2022 0.793  0.350 - 4.500 uIU/mL Final   Comment: Performed by a 3rd Generation assay with a functional sensitivity of <=0.01 uIU/mL. Performed at [facility], [address], [place], [place] [address]    Free T4 09/05/2022 0.73  0.61 - 1.12 ng/dL Final   Comment: (NOTE) Biotin ingestion may interfere with free T4 tests. If the results are inconsistent with the TSH level, previous test results, or the clinical presentation, then consider biotin interference. If needed, order repeat testing after stopping biotin. Performed at [facility], [address], [place], [place] [address]    Preg Test, Ur 09/06/2022 Negative  Negative Final   Preg Test, Ur 09/06/2022 NEGATIVE  NEGATIVE Final   Comment:        THE SENSITIVITY OF THIS METHODOLOGY IS >24 mIU/mL    Preg Test, Ur 09/05/2022 NEGATIVE  NEGATIVE Final   Comment:        THE SENSITIVITY OF THIS METHODOLOGY IS >24 mIU/mL    Sodium 09/13/2022 136  135 - 145 mmol/L Final   Potassium 09/13/2022 4.5  3.5 - 5.1 mmol/L Final   Chloride 09/13/2022 105  98 - 111 mmol/L Final   CO2 09/13/2022 21 (L)  22 - 32 mmol/L Final   Glucose, Bld 09/13/2022 111 (H)  70 - 99 mg/dL Final   Glucose reference range applies only to samples taken after fasting for at least 8 hours.   BUN 09/13/2022 14  6 - 20 mg/dL Final   Creatinine, Ser 09/13/2022 0.92  0.44 - 1.00 mg/dL Final   Calcium 97/53/0051 9.1  8.9 - 10.3 mg/dL Final   GFR, Estimated 09/13/2022 >60  >60 mL/min Final   Comment: (NOTE) Calculated using the CKD-EPI  Creatinine Equation (2021)    Anion gap 09/13/2022 10  5 - 15 Final   Performed at [facility], [address] [place], [place] [address]   Vitamin B-12 09/13/2022 585  180 - 914 pg/mL Final   Comment: (NOTE) This assay is not validated for testing neonatal or myeloproliferative syndrome specimens for Vitamin B12 levels. Performed at [facility], [address] [place], [place] [address]    Color, Urine 09/14/2022 YELLOW  YELLOW Final   APPearance 09/14/2022 HAZY (A)  CLEAR Final   Specific Gravity, Urine 09/14/2022 1.025  1.005 - 1.030 Final   pH 09/14/2022 5.0  5.0 - 8.0 Final   Glucose, UA 09/14/2022 NEGATIVE  NEGATIVE mg/dL Final   Hgb urine dipstick 09/14/2022 NEGATIVE  NEGATIVE Final   Bilirubin Urine 09/14/2022 NEGATIVE  NEGATIVE Final   Ketones, ur 09/14/2022 NEGATIVE  NEGATIVE mg/dL Final   Protein, ur 70/14/1030 NEGATIVE  NEGATIVE mg/dL Final   Nitrite 13/14/3888 NEGATIVE  NEGATIVE Final   Leukocytes,Ua 09/14/2022 TRACE (A)  NEGATIVE Final   RBC / HPF 09/14/2022 0-5  0 - 5 RBC/hpf Final   WBC, UA 09/14/2022 0-5  0 - 5 WBC/hpf Final   Bacteria, UA 09/14/2022 RARE (A)  NONE SEEN Final   Squamous Epithelial / LPF 09/14/2022 0-5  0 - 5 Final   Mucus 09/14/2022 PRESENT   Final   Performed at [facility], [address], [place], [place] [address]   Glucose-Capillary 09/16/2022 105 (H)  70 - 99 mg/dL Final   Glucose reference range applies only to samples taken after fasting for at least 8 hours.  Admission on 07/04/2022, Discharged on 07/05/2022  Component Date Value Ref Range Status   Sodium 07/04/2022 142  135 - 145 mmol/L Final   Potassium 07/04/2022 4.4  3.5 - 5.1 mmol/L Final   Chloride 07/04/2022 109  98 - 111 mmol/L Final   CO2 07/04/2022 26  22 - 32 mmol/L Final   Glucose, Bld 07/04/2022 96  70 - 99 mg/dL Final   Glucose reference range applies only to samples taken after fasting for at least 8 hours.   BUN 07/04/2022 15  6 - 20 mg/dL Final    Creatinine, Ser 07/04/2022 0.83  0.44 - 1.00 mg/dL Final   Calcium 60/45/4098 9.3  8.9 - 10.3 mg/dL Final   Total Protein 11/91/4782 7.2  6.5 - 8.1 g/dL Final   Albumin 95/62/1308 3.9  3.5 - 5.0 g/dL Final   AST 65/78/4696 23  15 - 41 U/L Final   ALT 07/04/2022 28  0 - 44 U/L Final   Alkaline Phosphatase 07/04/2022 77  38 - 126 U/L Final   Total Bilirubin 07/04/2022 0.4  0.3 - 1.2 mg/dL Final   GFR, Estimated 07/04/2022 >60  >60 mL/min Final   Comment: (NOTE) Calculated using the CKD-EPI Creatinine Equation (2021)    Anion gap 07/04/2022 7  5 - 15 Final   Performed at [facility], [address] [place], [place] [address]   Alcohol, Ethyl (B) 07/04/2022 <10  <10 mg/dL Final   Comment: (NOTE) Lowest detectable limit for serum alcohol is 10 mg/dL.  For medical purposes only. Performed at [facility], [address] [place], [place] [address]    WBC 07/04/2022 7.0  4.0 - 10.5 K/uL Final   RBC 07/04/2022 4.18  3.87 - 5.11 MIL/uL Final   Hemoglobin 07/04/2022 12.8  12.0 - 15.0 g/dL Final   HCT 40/07/2724 39.1  36.0 - 46.0 % Final   MCV 07/04/2022 93.5  80.0 - 100.0 fL Final   MCH 07/04/2022 30.6  26.0 - 34.0 pg Final   MCHC 07/04/2022 32.7  30.0 - 36.0 g/dL Final   RDW 36/64/4034 12.9  11.5 - 15.5 % Final   Platelets 07/04/2022 243  150 - 400 K/uL Final   nRBC 07/04/2022 0.0  0.0 - 0.2 % Final   Neutrophils Relative % 07/04/2022 43  % Final   Neutro Abs 07/04/2022 3.0  1.7 - 7.7 K/uL Final   Lymphocytes Relative 07/04/2022 50  % Final   Lymphs Abs 07/04/2022 3.5  0.7 - 4.0 K/uL Final   Monocytes Relative 07/04/2022 7  % Final   Monocytes Absolute 07/04/2022 0.5  0.1 - 1.0 K/uL Final   Eosinophils Relative 07/04/2022 0  % Final   Eosinophils Absolute 07/04/2022 0.0  0.0 - 0.5 K/uL Final   Basophils Relative 07/04/2022 0  % Final   Basophils Absolute 07/04/2022 0.0  0.0 - 0.1 K/uL Final   Immature Granulocytes 07/04/2022 0  % Final   Abs  Immature Granulocytes 07/04/2022 0.01  0.00 - 0.07 K/uL Final   Performed at [facility], [address] [place], [place] [address]   I-stat hCG, quantitative 07/04/2022 <5.0  <5 mIU/mL Final   Comment 3 07/04/2022          Final  Comment:   GEST. AGE      CONC.  (mIU/mL)   <=1 WEEK        5 - 50     2 WEEKS       50 - 500     3 WEEKS       100 - 10,000     4 WEEKS     1,000 - 30,000        FEMALE AND NON-PREGNANT FEMALE:     LESS THAN 5 mIU/mL   Admission on 06/14/2022, Discharged on 06/14/2022  Component Date Value Ref Range Status   Color, UA 06/14/2022 yellow  yellow Final   Clarity, UA 06/14/2022 cloudy (A)  clear Final   Glucose, UA 06/14/2022 negative  negative mg/dL Final   Bilirubin, UA 93/26/7124 negative  negative Final   Ketones, POC UA 06/14/2022 negative  negative mg/dL Final   Spec Grav, UA 58/06/9832 1.025  1.010 - 1.025 Final   Blood, UA 06/14/2022 negative  negative Final   pH, UA 06/14/2022 7.5  5.0 - 8.0 Final   Protein Ur, POC 06/14/2022 =30 (A)  negative mg/dL Final   Urobilinogen, UA 06/14/2022 0.2  0.2 or 1.0 E.U./dL Final   Nitrite, UA 82/50/5397 Negative  Negative Final   Leukocytes, UA 06/14/2022 Negative  Negative Final   Preg Test, Ur 06/14/2022 Negative  Negative Final   Specimen Description 06/14/2022 URINE, CLEAN CATCH   Final   Special Requests 06/14/2022    Final                   Value:NONE Performed at [facility], [address] [place], [place] [address]    Culture 06/14/2022 MULTIPLE SPECIES PRESENT, SUGGEST RECOLLECTION (A)   Final   Report Status 06/14/2022 06/16/2022 FINAL   Final  Admission on 06/07/2022, Discharged on 06/10/2022  Component Date Value Ref Range Status   SARS Coronavirus 2 by RT PCR 06/07/2022 NEGATIVE  NEGATIVE Final   Comment: (NOTE) SARS-CoV-2 target nucleic acids are NOT DETECTED.  The SARS-CoV-2 RNA is generally detectable in upper respiratory specimens during the acute phase of  infection. The lowest concentration of SARS-CoV-2 viral copies this assay can detect is 138 copies/mL. A negative result does not preclude SARS-Cov-2 infection and should not be used as the sole basis for treatment or other patient management decisions. A negative result may occur with  improper specimen collection/handling, submission of specimen other than nasopharyngeal swab, presence of viral mutation(s) within the areas targeted by this assay, and inadequate number of viral copies(<138 copies/mL). A negative result must be combined with clinical observations, patient history, and epidemiological information. The expected result is Negative.  Fact Sheet for Patients:  BloggerCourse.com  Fact Sheet for Healthcare Providers:  SeriousBroker.it  This test is no                          t yet approved or cleared by the [place] FDA and  has been authorized for detection and/or diagnosis of SARS-CoV-2 by FDA under an Emergency Use Authorization (EUA). This EUA will remain  in effect (meaning this test can be used) for the duration of the COVID-19 declaration under Section 564(b)(1) of the Act, 21 U.S.C.section 360bbb-3(b)(1), unless the authorization is terminated  or revoked sooner.       Influenza A by PCR 06/07/2022 NEGATIVE  NEGATIVE Final   Influenza B by PCR 06/07/2022 NEGATIVE  NEGATIVE Final  Comment: (NOTE) The Xpert Xpress SARS-CoV-2/FLU/RSV plus assay is intended as an aid in the diagnosis of influenza from Nasopharyngeal swab specimens and should not be used as a sole basis for treatment. Nasal washings and aspirates are unacceptable for Xpert Xpress SARS-CoV-2/FLU/RSV testing.  Fact Sheet for Patients: BloggerCourse.com  Fact Sheet for Healthcare Providers: SeriousBroker.it  This test is not yet approved or cleared by the [place] FDA and has been  authorized for detection and/or diagnosis of SARS-CoV-2 by FDA under an Emergency Use Authorization (EUA). This EUA will remain in effect (meaning this test can be used) for the duration of the COVID-19 declaration under Section 564(b)(1) of the Act, 21 U.S.C. section 360bbb-3(b)(1), unless the authorization is terminated or revoked.  Performed at [facility], [address], [place], [place] [address]    WBC 06/07/2022 5.7  4.0 - 10.5 K/uL Final   RBC 06/07/2022 4.39  3.87 - 5.11 MIL/uL Final   Hemoglobin 06/07/2022 13.2  12.0 - 15.0 g/dL Final   HCT 60/45/4098 40.4  36.0 - 46.0 % Final   MCV 06/07/2022 92.0  80.0 - 100.0 fL Final   MCH 06/07/2022 30.1  26.0 - 34.0 pg Final   MCHC 06/07/2022 32.7  30.0 - 36.0 g/dL Final   RDW 11/91/4782 12.4  11.5 - 15.5 % Final   Platelets 06/07/2022 308  150 - 400 K/uL Final   nRBC 06/07/2022 0.0  0.0 - 0.2 % Final   Neutrophils Relative % 06/07/2022 42  % Final   Neutro Abs 06/07/2022 2.4  1.7 - 7.7 K/uL Final   Lymphocytes Relative 06/07/2022 54  % Final   Lymphs Abs 06/07/2022 3.1  0.7 - 4.0 K/uL Final   Monocytes Relative 06/07/2022 4  % Final   Monocytes Absolute 06/07/2022 0.2  0.1 - 1.0 K/uL Final   Eosinophils Relative 06/07/2022 0  % Final   Eosinophils Absolute 06/07/2022 0.0  0.0 - 0.5 K/uL Final   Basophils Relative 06/07/2022 0  % Final   Basophils Absolute 06/07/2022 0.0  0.0 - 0.1 K/uL Final   Immature Granulocytes 06/07/2022 0  % Final   Abs Immature Granulocytes 06/07/2022 0.01  0.00 - 0.07 K/uL Final   Performed at [facility], [address] [place], [place] [address]   Sodium 06/07/2022 139  135 - 145 mmol/L Final   Potassium 06/07/2022 4.0  3.5 - 5.1 mmol/L Final   Chloride 06/07/2022 104  98 - 111 mmol/L Final   CO2 06/07/2022 28  22 - 32 mmol/L Final   Glucose, Bld 06/07/2022 104 (H)  70 - 99 mg/dL Final   Glucose reference range applies only to samples taken after fasting for at least 8 hours.   BUN  06/07/2022 8  6 - 20 mg/dL Final   Creatinine, Ser 06/07/2022 0.84  0.44 - 1.00 mg/dL Final   Calcium 30/86/5784 9.1  8.9 - 10.3 mg/dL Final   Total Protein 69/62/9528 6.9  6.5 - 8.1 g/dL Final   Albumin 41/32/4401 3.7  3.5 - 5.0 g/dL Final   AST 02/72/5366 19  15 - 41 U/L Final   ALT 06/07/2022 22  0 - 44 U/L Final   Alkaline Phosphatase 06/07/2022 54  38 - 126 U/L Final   Total Bilirubin 06/07/2022 0.4  0.3 - 1.2 mg/dL Final   GFR, Estimated 06/07/2022 >60  >60 mL/min Final   Comment: (NOTE) Calculated using the CKD-EPI Creatinine Equation (2021)    Anion gap 06/07/2022 7  5 - 15 Final   Performed at [facility], [address] [place], [place] [address]   Hgb A1c MFr Bld 06/07/2022 5.0  4.8 - 5.6 % Final   Comment: (NOTE) Pre diabetes:          5.7%-6.4%  Diabetes:              >6.4%  Glycemic control for   <7.0% adults with diabetes    Mean Plasma Glucose 06/07/2022 96.8  mg/dL Final   Performed at [facility], [address] [place], [place] [address]   TSH 06/07/2022 1.620  0.350 - 4.500 uIU/mL Final   Comment: Performed by a 3rd Generation assay with a functional sensitivity of <=0.01 uIU/mL. Performed at [facility], [address], [place], [place] [address]    RPR Ser Ql 06/07/2022 NON REACTIVE  NON REACTIVE Final   Performed at [facility], [address], [place], [place] [address]   Color, Urine 06/07/2022 YELLOW  YELLOW Final   APPearance 06/07/2022 HAZY (A)  CLEAR Final   Specific Gravity, Urine 06/07/2022 1.018  1.005 - 1.030 Final   pH 06/07/2022 7.0  5.0 - 8.0 Final   Glucose, UA 06/07/2022 NEGATIVE  NEGATIVE mg/dL Final   Hgb urine dipstick 06/07/2022 NEGATIVE  NEGATIVE Final   Bilirubin Urine 06/07/2022 NEGATIVE  NEGATIVE Final   Ketones, ur 06/07/2022 NEGATIVE  NEGATIVE mg/dL Final   Protein, ur 65/78/4696 NEGATIVE  NEGATIVE mg/dL Final   Nitrite 29/52/8413 NEGATIVE  NEGATIVE Final   Leukocytes,Ua 06/07/2022 NEGATIVE   NEGATIVE Final   Performed at [facility], [address], [place], [place] [address]   Cholesterol 06/07/2022 164  0 - 200 mg/dL Final   Triglycerides 02/72/5366 158 (H)  <150 mg/dL Final   HDL 44/12/4740 44  >40 mg/dL Final   Total CHOL/HDL Ratio 06/07/2022 3.7  RATIO Final   VLDL 06/07/2022 32  0 - 40 mg/dL Final   LDL Cholesterol 06/07/2022 88  0 - 99 mg/dL Final   Comment:        Total Cholesterol/HDL:CHD Risk Coronary Heart Disease Risk Table                     Men   Women  1/2 Average Risk   3.4   3.3  Average Risk       5.0   4.4  2 X Average Risk   9.6   7.1  3 X Average Risk  23.4   11.0        Use the calculated Patient Ratio above and the CHD Risk Table to determine the patient's CHD Risk.        ATP III CLASSIFICATION (LDL):  <100     mg/dL   Optimal  595-638  mg/dL   Near or Above                    Optimal  130-159  mg/dL   Borderline  756-433  mg/dL   High  >295     mg/dL   Very High Performed at [facility], [address] [place], [place] [address]    HIV Screen 4th Generation wRfx 06/07/2022 Non Reactive  Non Reactive Final   Performed at [facility], [address], [place], [place] [address]   SARSCOV2ONAVIRUS 2 AG 06/07/2022 NEGATIVE  NEGATIVE Final   Comment: (NOTE) SARS-CoV-2 antigen NOT DETECTED.  Negative results are presumptive.  Negative results do not preclude SARS-CoV-2 infection and should not be used as the sole basis for treatment or other patient management decisions, including infection  control decisions, particularly in the presence of clinical signs and  symptoms consistent with COVID-19, or in those who have been in contact with the virus.  Negative results must be combined with clinical observations, patient history, and epidemiological information. The expected result is Negative.  Fact Sheet for Patients: https://www.jennings-kim.com/  Fact Sheet for Healthcare  Providers: https://alexander-rogers.biz/  This test is not yet approved or cleared by the [place] FDA and  has been authorized for detection and/or diagnosis of SARS-CoV-2 by FDA under an Emergency Use Authorization (EUA).  This EUA will remain in effect (meaning this test can be used) for the duration of  the COV                          ID-19 declaration under Section 564(b)(1) of the Act, 21 U.S.C. section 360bbb-3(b)(1), unless the authorization is terminated or revoked sooner.     POC Amphetamine UR 06/07/2022 None Detected  NONE DETECTED (Cut Off Level 1000 ng/mL) Final   POC Secobarbital (BAR) 06/07/2022 None Detected  NONE DETECTED (Cut Off Level 300 ng/mL) Final   POC Buprenorphine (BUP) 06/07/2022 None Detected  NONE DETECTED (Cut Off Level 10 ng/mL) Final   POC Oxazepam (BZO) 06/07/2022 None Detected  NONE DETECTED (Cut Off Level 300 ng/mL) Final   POC Cocaine UR 06/07/2022 None Detected  NONE DETECTED (Cut Off Level 300 ng/mL) Final   POC Methamphetamine UR 06/07/2022 None Detected  NONE DETECTED (Cut Off Level 1000 ng/mL) Final   POC Morphine 06/07/2022 None Detected  NONE DETECTED (Cut Off Level 300 ng/mL) Final   POC Methadone UR 06/07/2022 None Detected  NONE DETECTED (Cut Off Level 300 ng/mL) Final   POC Oxycodone UR 06/07/2022 None Detected  NONE DETECTED (Cut Off Level 100 ng/mL) Final   POC Marijuana UR 06/07/2022 None Detected  NONE DETECTED (Cut Off Level 50 ng/mL) Final   Preg Test, Ur 06/08/2022 NEGATIVE  NEGATIVE Final   Comment:        THE SENSITIVITY OF THIS METHODOLOGY IS >20 mIU/mL. Performed at [facility], [address] [place], [place] [address]    Preg Test, Ur 06/08/2022 NEGATIVE  NEGATIVE Final   Comment:        THE SENSITIVITY OF THIS METHODOLOGY IS >24 mIU/mL     Blood Alcohol level:  Lab Results  Component Value Date   ETH <10 07/04/2022   ETH <10 02/07/2022    Metabolic Disorder Labs: Lab Results  Component  Value Date   HGBA1C 5.0 06/07/2022   MPG 96.8 06/07/2022   MPG 96.8 02/07/2022   No results found for: "PROLACTIN" Lab Results  Component Value Date   CHOL 181 07/25/2022   TRIG 118 07/25/2022   HDL 45 07/25/2022   CHOLHDL 4.0 07/25/2022   VLDL 24 07/25/2022   LDLCALC 112 (H) 07/25/2022   LDLCALC 88 06/07/2022    Therapeutic Lab Levels: No results found for: "LITHIUM" No results found for: "VALPROATE" No results found for: "CBMZ"  Physical Findings   GAD-7    Flowsheet Row [facility]17/2021 [facility]  Total GAD-7 Score 17      PHQ2-9    Flowsheet Row ED from 06/07/2022 in [facility] Office Visit  from 12/17/2021 in [facility]  PHQ-2 Total Score 1 2  PHQ-9 Total Score 2 8      Flowsheet Row ED from 07/25/2022 in [facility] ED from 07/04/2022 in [facility] from 06/14/2022 in [facility]   C-SSRS RISK CATEGORY No Risk No Risk No Risk        Musculoskeletal  Strength & Muscle Tone: within normal limits Gait & Station: normal Patient leans: N/A  Psychiatric Specialty Exam  Presentation  General Appearance:  Appropriate for Environment  Eye Contact: Good  Speech: Clear and Coherent; Normal Rate  Speech Volume: Normal  Handedness: Right   Mood and Affect  Mood: Euthymic  Affect: Appropriate; Congruent   Thought Process  Thought Processes: Coherent; Goal Directed  Descriptions of Associations:Intact  Orientation:Full (Time, Place and Person)  Thought Content:Logical  Diagnosis of Schizophrenia or Schizoaffective disorder in past: Yes  Duration of Psychotic Symptoms: Greater than six months   Hallucinations:Hallucinations: None  Ideas of Reference:None  Suicidal Thoughts:Suicidal Thoughts: No  Homicidal Thoughts:Homicidal Thoughts:  No   Sensorium  Memory: Immediate Good; Recent Fair  Judgment: Intact  Insight: Present   Executive Functions  Concentration: Fair  Attention Span: Fair  Recall: Fair  Fund of Knowledge: Fair  Language: Good   Psychomotor Activity  Psychomotor Activity: Psychomotor Activity: Normal   Assets  Assets: Communication Skills; Desire for Improvement; Financial Resources/Insurance   Sleep  Sleep: Sleep: Good   Nutritional Assessment (For OBS and [facility]BC admissions only) Has the patient had a weight loss or gain of 10 pounds or more in the last 3 months?: No Has the patient had a decrease in food intake/or appetite?: No Does the patient have dental problems?: No Does the patient have eating habits or behaviors that may be indicators of an eating disorder including binging or inducing vomiting?: No Has the patient recently lost weight without trying?: 0 Has the patient been eating poorly because of a decreased appetite?: 0 Malnutrition Screening Tool Score: 0   Physical Exam  Physical Exam Constitutional:      General: She is not in acute distress.    Appearance: She is not ill-appearing, toxic-appearing or diaphoretic.  Eyes:     Conjunctiva/sclera: Conjunctivae normal.  Cardiovascular:     Rate and Rhythm: Normal rate.  Pulmonary:     Effort: Pulmonary effort is normal. No respiratory distress.  Musculoskeletal:        General: Normal range of motion.     Cervical back: Normal range of motion.  Neurological:     Mental Status: She is alert and oriented to person, place, and time.  Psychiatric:        Attention and Perception: Attention and perception normal. She does not perceive auditory or visual hallucinations.        Mood and Affect: Mood and affect normal.        Speech: Speech normal.        Behavior: Behavior normal. Behavior is cooperative.        Thought Content: Thought content normal. Thought content is not paranoid or delusional. Thought  content does not include homicidal or suicidal ideation.    Review of Systems  Constitutional:  Negative for chills and fever.  Respiratory:  Negative for shortness of breath.   Cardiovascular:  Negative for chest pain and palpitations.  Musculoskeletal: Negative.   Neurological:  Negative for headaches.  Psychiatric/Behavioral: Negative.  All other systems reviewed and are negative.  Blood pressure 103/66, pulse 100, temperature 97.8 F (36.6 C), temperature source Oral, resp. rate 18, SpO2 100 %. There is no height or weight on file to calculate BMI.  Treatment Plan Summary: Plan Pt remains psychiatrically cleared. Disposition is pending placement.  Shelah Heatley, NP 09/23/2022 1:09 PM

## 2022-09-23 NOTE — Progress Notes (Signed)
[name] Progress Note  1226 - Contacted [facility] @ [phone] to inquire about possession of medical records for pt.  [name] contacted [name] via email stating there should be records at [facility] with information on the I/DD diagnosis.  [name] contacted [facility] to inquire if it had once been referred to as [facility] and if there were records for the pt there.    44 - Spoke with Ms. Renato Shin @ [phone] regarding documentation for I/DD diagnosis.  [name] was informed that the documentation needed was at the pt's previous group home as stated by the group home owner, [name], who initially denied having the documentation.  Ms. Rockney Ghee is now stating Elmarie Mainland, [facility] Manager and anyone else can get the same documentation from [facility].  It would appear she refuses to relinquish the documents as requested.  1412 - Contacted Roderic Palau, [place] DHHS, Mental Health Licensure and Certification @ [phone] and left a voice message requesting a call back.  Phone call is intended to inquire about Ms. Okeke's refusal to relinquish documentation needed for continuum of care and what the regulations state.  1424 - Contacted Gilda Crease for Medical Records @ [facility], [phone], and left a voice message requesting a call back to inquire about the existence of medical records for the pt.   Hansel Starling, MSW, [name] [facility]  [phone] phone [phone] fax

## 2022-09-23 NOTE — ED Notes (Signed)
Patient calm, cooperative. Received hygiene products to shower upon request.

## 2022-09-23 NOTE — ED Notes (Signed)
Pt presents with pleasant mood, affect congruent. Nicole [name] states she is doing '' fine '' denies any acute concerns. Continues to endorse chronic constipation but verbalizes '' I don't like water '' when it is encouraged. Pt compliant with medications. Denies any SI HI or AVH . Pt is safe. Remains boarding.

## 2022-09-23 NOTE — ED Notes (Signed)
Patient alert and oriented x 3. Denies SI/HI/AVH. Denies intent or plan to harm self or others. Routine conducted according to faculty protocol. Encourage patient to notify staff with any needs or concerns. Patient verbalized agreement and understanding. Will continue to monitor for safety. 

## 2022-09-23 NOTE — ED Notes (Signed)
Pt was given remote controller to television by RN on day shift she was listening to rap music that had foul language and was asked by the MHT from second shift to give I'm the remote. Pt refused and she ran into the flex area laughing and saying she is going to play football. She ran out of the flex area and into to the bathroom with the remote. Security came and she finally handed over the remote control. Pt is talking with another patient at this time by the nursing station

## 2022-09-24 ENCOUNTER — Encounter (HOSPITAL_COMMUNITY): Payer: Self-pay | Admitting: Registered Nurse

## 2022-09-24 NOTE — ED Notes (Signed)
Patient resting on bed with eyes closed, rise and fall of chest noted. No acute events noted.

## 2022-09-24 NOTE — ED Provider Notes (Signed)
Behavioral Health Progress Note  Date and Time: 09/24/2022 11:18 AM Name: Nicole [name] MRN:  [id number]  Subjective:   Nicole [name] is a 31 y.o. female, with PMH of SCZ unspecified, MDD with psychosis, who presented voluntary to [facility] (07/25/2022) via [facility] after a verbal altercation with Nicole [name] (not patient's legal guardian) for transient SI.  This is patient's fourth visit to [facility] for similar concerns this year. Patient has been dismissed from previous group home due to threatening SI and HI, then leaving the group home.   Legal Guardian: Mom Nicole [name]) transitioning to be Dad Nicole [name]) Point of contact: Dad Nicole [name])  Nicole [name], 37 y.o., female patient seen face to face by this provider, consulted with Dr. Gretta Cool; and chart reviewed on 09/24/22.  On evaluation Nicole [name] is standing at the nursing station.  She states she is in a good mood and that she is expecting good news about her placement.  Reports that social work did not come to see her yesterday but would like for social work to come see her and give an update on where things are with her placement.  Wants to know if there is something she can do to help speed things up.  Patient continues to deny suicidal/self-harm/homicidal ideation, psychosis, and paranoia.  Reports she is eating and sleeping without difficulty.  States she is taking medications as prescribed with out adverse reaction.    Diagnosis:  Final diagnoses:  Severe episode of recurrent major depressive disorder, with psychotic features (HCC)    Total Time spent with patient: 15 minutes  Past Psychiatric History: see HPI Past Medical History:  Past Medical History:  Diagnosis Date   Anxiety    Depression    Hypothyroidism 08/07/2022   Tobacco use disorder 08/07/2022    Past Surgical History:  Procedure Laterality Date   WISDOM TOOTH EXTRACTION Bilateral 2020   Family History:  Family History  Problem Relation Age of  Onset   Hypertension Father    Diabetes Father    Family Psychiatric  History: None reported Social History:  Social History   Substance and Sexual Activity  Alcohol Use Never     Social History   Substance and Sexual Activity  Drug Use Never    Social History   Socioeconomic History   Marital status: Single    Spouse name: Not on file   Number of children: Not on file   Years of education: Not on file   Highest education level: Not on file  Occupational History   Not on file  Tobacco Use   Smoking status: Every Day    Types: Cigarettes   Smokeless tobacco: Never  Vaping Use   Vaping Use: Some days  Substance and Sexual Activity   Alcohol use: Never   Drug use: Never   Sexual activity: Yes    Partners: Female    Birth control/protection: Implant  Other Topics Concern   Not on file  Social History Narrative   Not on file   Social Determinants of Health   Financial Resource Strain: Not on file  Food Insecurity: Not on file  Transportation Needs: Not on file  Physical Activity: Not on file  Stress: Not on file  Social Connections: Not on file   SDOH:  SDOH Screenings   Depression (PHQ2-9): Low Risk  (06/10/2022)  Tobacco Use: High Risk (09/24/2022)   Additional Social History:    Pain Medications: SEE MAR Prescriptions: SEE MAR Over the Counter: SEE MAR  History of alcohol / drug use?: Yes Longest period of sobriety (when/how long): N/A Negative Consequences of Use:  (NONE) Withdrawal Symptoms: None Name of Substance 1: ETOH IN PAST--PT CURRENTLY DENIES 1 - Frequency: SOCIAL USE ETOH IN PAST 1 - Method of Aquiring: LEGAL 1- Route of Use: ORAL DRINK Name of Substance 2: NICOTINE-VAPE AND CIGARETTES 2 - Amount (size/oz): LESS THAN ONE PACK 2 - Frequency: DAILY 2 - Method of Aquiring: LEGAL 2 - Route of Substance Use: ORAL SMOKE     Sleep: Good  Appetite:  Good  Current Medications:  Current Facility-Administered Medications  Medication  Dose Route Frequency Provider Last Rate Last Admin   acetaminophen (TYLENOL) tablet 650 mg  650 mg Oral Q6H PRN Onuoha, Chinwendu V, NP   650 mg at 09/21/22 2200   ARIPiprazole ER (ABILIFY MAINTENA) injection 400 mg  400 mg Intramuscular Q28 days Princess BruinsNguyen, Julie, DO   400 mg at 08/30/22 1338   docusate sodium (COLACE) capsule 100 mg  100 mg Oral Daily Lamar Sprinklesosby, Courtney, MD   100 mg at 09/24/22 0818   hydrOXYzine (ATARAX) tablet 25 mg  25 mg Oral TID PRN Onuoha, Chinwendu V, NP   25 mg at 09/23/22 2111   ibuprofen (ADVIL) tablet 400 mg  400 mg Oral Q6H PRN Onuoha, Chinwendu V, NP   400 mg at 09/20/22 2115   levothyroxine (SYNTHROID) tablet 100 mcg  100 mcg Oral Q0600 Princess BruinsNguyen, Julie, DO   100 mcg at 09/24/22 0557   loratadine (CLARITIN) tablet 10 mg  10 mg Oral Daily Elishia Kaczorowski B, NP   10 mg at 09/24/22 0818   metFORMIN (GLUCOPHAGE) tablet 500 mg  500 mg Oral Q breakfast Park PopeJi, Andrew, MD   500 mg at 09/24/22 0818   nicotine (NICODERM CQ - dosed in mg/24 hours) patch 14 mg  14 mg Transdermal Q0600 Onuoha, Chinwendu V, NP   14 mg at 09/24/22 0819   ondansetron (ZOFRAN-ODT) disintegrating tablet 4 mg  4 mg Oral Q8H PRN Sindy GuadeloupeWilliams, Roy, NP   4 mg at 09/14/22 2351   Oxcarbazepine (TRILEPTAL) tablet 300 mg  300 mg Oral BID Onuoha, Chinwendu V, NP   300 mg at 09/24/22 0818   pantoprazole (PROTONIX) EC tablet 20 mg  20 mg Oral Daily Princess BruinsNguyen, Julie, DO   20 mg at 09/24/22 0818   QUEtiapine (SEROQUEL) tablet 400 mg  400 mg Oral BID Onuoha, Chinwendu V, NP   400 mg at 09/24/22 0818   sertraline (ZOLOFT) tablet 150 mg  150 mg Oral Daily Onuoha, Chinwendu V, NP   150 mg at 09/24/22 0818   traZODone (DESYREL) tablet 100 mg  100 mg Oral QHS Vernard Gamblesoleman, Carolyn H, NP   100 mg at 09/23/22 2112   valACYclovir (VALTREX) tablet 500 mg  500 mg Oral Daily Princess BruinsNguyen, Julie, DO   500 mg at 09/24/22 0818   ziprasidone (GEODON) injection 20 mg  20 mg Intramuscular Q12H PRN Onuoha, Chinwendu V, NP   20 mg at 09/19/22 2133   Current  Outpatient Medications  Medication Sig Dispense Refill   ABILIFY MAINTENA 400 MG PRSY prefilled syringe 400 mg every 28 (twenty-eight) days.     cetirizine (ZYRTEC) 10 MG tablet Take 10 mg by mouth daily.     cyclobenzaprine (FLEXERIL) 10 MG tablet Take 1 tablet (10 mg total) by mouth 2 (two) times daily as needed for muscle spasms. 20 tablet 0   fluticasone (FLONASE) 50 MCG/ACT nasal spray Place 1 spray into both nostrils daily.  meloxicam (MOBIC) 15 MG tablet Take 15 mg by mouth daily.     Oxcarbazepine (TRILEPTAL) 300 MG tablet Take 1 tablet (300 mg total) by mouth 2 (two) times daily. 60 tablet 0   QUEtiapine (SEROQUEL) 400 MG tablet Take 1 tablet (400 mg total) by mouth 2 (two) times daily. 60 tablet 0   sertraline (ZOLOFT) 50 MG tablet Take 3 tablets (150 mg total) by mouth in the morning. 90 tablet 0   traZODone (DESYREL) 100 MG tablet Take 1 tablet (100 mg total) by mouth at bedtime. 30 tablet 0   valACYclovir (VALTREX) 500 MG tablet Take 500 mg by mouth daily.      Labs  Lab Results:  Admission on 07/25/2022  Component Date Value Ref Range Status   SARS Coronavirus 2 by RT PCR 07/25/2022 NEGATIVE  NEGATIVE Final   Comment: (NOTE) SARS-CoV-2 target nucleic acids are NOT DETECTED.  The SARS-CoV-2 RNA is generally detectable in upper respiratory specimens during the acute phase of infection. The lowest concentration of SARS-CoV-2 viral copies this assay can detect is 138 copies/mL. A negative result does not preclude SARS-Cov-2 infection and should not be used as the sole basis for treatment or other patient management decisions. A negative result may occur with  improper specimen collection/handling, submission of specimen other than nasopharyngeal swab, presence of viral mutation(s) within the areas targeted by this assay, and inadequate number of viral copies(<138 copies/mL). A negative result must be combined with clinical observations, patient history, and  epidemiological information. The expected result is Negative.  Fact Sheet for Patients:  BloggerCourse.com  Fact Sheet for Healthcare Providers:  SeriousBroker.it  This test is no                          t yet approved or cleared by the [place] FDA and  has been authorized for detection and/or diagnosis of SARS-CoV-2 by FDA under an Emergency Use Authorization (EUA). This EUA will remain  in effect (meaning this test can be used) for the duration of the COVID-19 declaration under Section 564(b)(1) of the Act, 21 U.S.C.section 360bbb-3(b)(1), unless the authorization is terminated  or revoked sooner.       Influenza A by PCR 07/25/2022 NEGATIVE  NEGATIVE Final   Influenza B by PCR 07/25/2022 NEGATIVE  NEGATIVE Final   Comment: (NOTE) The Xpert Xpress SARS-CoV-2/FLU/RSV plus assay is intended as an aid in the diagnosis of influenza from Nasopharyngeal swab specimens and should not be used as a sole basis for treatment. Nasal washings and aspirates are unacceptable for Xpert Xpress SARS-CoV-2/FLU/RSV testing.  Fact Sheet for Patients: BloggerCourse.com  Fact Sheet for Healthcare Providers: SeriousBroker.it  This test is not yet approved or cleared by the [place] FDA and has been authorized for detection and/or diagnosis of SARS-CoV-2 by FDA under an Emergency Use Authorization (EUA). This EUA will remain in effect (meaning this test can be used) for the duration of the COVID-19 declaration under Section 564(b)(1) of the Act, 21 U.S.C. section 360bbb-3(b)(1), unless the authorization is terminated or revoked.  Performed at [facility], [address] [place], [place] [address]    WBC 07/25/2022 8.3  4.0 - 10.5 K/uL Final   RBC 07/25/2022 4.44  3.87 - 5.11 MIL/uL Final   Hemoglobin 07/25/2022 13.7  12.0 - 15.0 g/dL Final   HCT 60/45/4098 40.2  36.0 - 46.0  % Final   MCV 07/25/2022 90.5  80.0 - 100.0 fL  Final   MCH 07/25/2022 30.9  26.0 - 34.0 pg Final   MCHC 07/25/2022 34.1  30.0 - 36.0 g/dL Final   RDW 16/07/9603 12.2  11.5 - 15.5 % Final   Platelets 07/25/2022 248  150 - 400 K/uL Final   nRBC 07/25/2022 0.0  0.0 - 0.2 % Final   Neutrophils Relative % 07/25/2022 43  % Final   Neutro Abs 07/25/2022 3.6  1.7 - 7.7 K/uL Final   Lymphocytes Relative 07/25/2022 52  % Final   Lymphs Abs 07/25/2022 4.2 (H)  0.7 - 4.0 K/uL Final   Monocytes Relative 07/25/2022 5  % Final   Monocytes Absolute 07/25/2022 0.4  0.1 - 1.0 K/uL Final   Eosinophils Relative 07/25/2022 0  % Final   Eosinophils Absolute 07/25/2022 0.0  0.0 - 0.5 K/uL Final   Basophils Relative 07/25/2022 0  % Final   Basophils Absolute 07/25/2022 0.0  0.0 - 0.1 K/uL Final   Immature Granulocytes 07/25/2022 0  % Final   Abs Immature Granulocytes 07/25/2022 0.02  0.00 - 0.07 K/uL Final   Performed at [facility], [address] [place], [place] [address]   Sodium 07/25/2022 138  135 - 145 mmol/L Final   Potassium 07/25/2022 4.0  3.5 - 5.1 mmol/L Final   Chloride 07/25/2022 104  98 - 111 mmol/L Final   CO2 07/25/2022 29  22 - 32 mmol/L Final   Glucose, Bld 07/25/2022 83  70 - 99 mg/dL Final   Glucose reference range applies only to samples taken after fasting for at least 8 hours.   BUN 07/25/2022 11  6 - 20 mg/dL Final   Creatinine, Ser 07/25/2022 0.97  0.44 - 1.00 mg/dL Final   Calcium 11/91/4782 9.2  8.9 - 10.3 mg/dL Final   Total Protein 95/62/1308 7.0  6.5 - 8.1 g/dL Final   Albumin 65/78/4696 3.8  3.5 - 5.0 g/dL Final   AST 29/52/8413 18  15 - 41 U/L Final   ALT 07/25/2022 22  0 - 44 U/L Final   Alkaline Phosphatase 07/25/2022 64  38 - 126 U/L Final   Total Bilirubin 07/25/2022 0.2 (L)  0.3 - 1.2 mg/dL Final   GFR, Estimated 07/25/2022 >60  >60 mL/min Final   Comment: (NOTE) Calculated using the CKD-EPI Creatinine Equation (2021)    Anion gap 07/25/2022 5  5 - 15  Final   Performed at [facility], [address], [place], [place] [address]   POC Amphetamine UR 07/25/2022 None Detected  NONE DETECTED (Cut Off Level 1000 ng/mL) Preliminary   POC Secobarbital (BAR) 07/25/2022 None Detected  NONE DETECTED (Cut Off Level 300 ng/mL) Preliminary   POC Buprenorphine (BUP) 07/25/2022 None Detected  NONE DETECTED (Cut Off Level 10 ng/mL) Preliminary   POC Oxazepam (BZO) 07/25/2022 None Detected  NONE DETECTED (Cut Off Level 300 ng/mL) Preliminary   POC Cocaine UR 07/25/2022 None Detected  NONE DETECTED (Cut Off Level 300 ng/mL) Preliminary   POC Methamphetamine UR 07/25/2022 None Detected  NONE DETECTED (Cut Off Level 1000 ng/mL) Preliminary   POC Morphine 07/25/2022 None Detected  NONE DETECTED (Cut Off Level 300 ng/mL) Preliminary   POC Methadone UR 07/25/2022 None Detected  NONE DETECTED (Cut Off Level 300 ng/mL) Preliminary   POC Oxycodone UR 07/25/2022 None Detected  NONE DETECTED (Cut Off Level 100 ng/mL) Preliminary   POC Marijuana UR 07/25/2022 None Detected  NONE DETECTED (Cut Off Level 50 ng/mL) Preliminary   SARSCOV2ONAVIRUS 2 AG  07/25/2022 NEGATIVE  NEGATIVE Final   Comment: (NOTE) SARS-CoV-2 antigen NOT DETECTED.   Negative results are presumptive.  Negative results do not preclude SARS-CoV-2 infection and should not be used as the sole basis for treatment or other patient management decisions, including infection  control decisions, particularly in the presence of clinical signs and  symptoms consistent with COVID-19, or in those who have been in contact with the virus.  Negative results must be combined with clinical observations, patient history, and epidemiological information. The expected result is Negative.  Fact Sheet for Patients: https://www.jennings-kim.com/  Fact Sheet for Healthcare Providers: https://alexander-rogers.biz/  This test is not yet approved or cleared by the [place] FDA and  has  been authorized for detection and/or diagnosis of SARS-CoV-2 by FDA under an Emergency Use Authorization (EUA).  This EUA will remain in effect (meaning this test can be used) for the duration of  the COV                          ID-19 declaration under Section 564(b)(1) of the Act, 21 U.S.C. section 360bbb-3(b)(1), unless the authorization is terminated or revoked sooner.     Cholesterol 07/25/2022 181  0 - 200 mg/dL Final   Triglycerides 16/07/9603 118  <150 mg/dL Final   HDL 54/06/8118 45  >40 mg/dL Final   Total CHOL/HDL Ratio 07/25/2022 4.0  RATIO Final   VLDL 07/25/2022 24  0 - 40 mg/dL Final   LDL Cholesterol 07/25/2022 112 (H)  0 - 99 mg/dL Final   Comment:        Total Cholesterol/HDL:CHD Risk Coronary Heart Disease Risk Table                     Men   Women  1/2 Average Risk   3.4   3.3  Average Risk       5.0   4.4  2 X Average Risk   9.6   7.1  3 X Average Risk  23.4   11.0        Use the calculated Patient Ratio above and the CHD Risk Table to determine the patient's CHD Risk.        ATP III CLASSIFICATION (LDL):  <100     mg/dL   Optimal  147-829  mg/dL   Near or Above                    Optimal  130-159  mg/dL   Borderline  562-130  mg/dL   High  >865     mg/dL   Very High Performed at [facility], [address] [place], [place] [address]    TSH 07/25/2022 6.668 (H)  0.350 - 4.500 uIU/mL Final   Comment: Performed by a 3rd Generation assay with a functional sensitivity of <=0.01 uIU/mL. Performed at [facility], [address] [place], [place] [address]    Glucose-Capillary 07/26/2022 104 (H)  70 - 99 mg/dL Final   Glucose reference range applies only to samples taken after fasting for at least 8 hours.   T3, Free 07/31/2022 2.3  2.0 - 4.4 pg/mL Final   Comment: (NOTE) Performed At: [facility] [address] [place], [place] [id number] Jolene Schimke MD UU:[id number]    Free T4 07/31/2022 0.60 (L)  0.61 - 1.12 ng/dL Final    Comment: (NOTE) Biotin ingestion may interfere with free T4 tests. If the results are  inconsistent with the TSH level, previous test results, or the clinical presentation, then consider biotin interference. If needed, order repeat testing after stopping biotin. Performed at [facility], [address] [place], [place] [address]    Glucose-Capillary 08/29/2022 100 (H)  70 - 99 mg/dL Final   Glucose reference range applies only to samples taken after fasting for at least 8 hours.   TSH 09/05/2022 0.793  0.350 - 4.500 uIU/mL Final   Comment: Performed by a 3rd Generation assay with a functional sensitivity of <=0.01 uIU/mL. Performed at [facility], [address] [place], [place] [address]    Free T4 09/05/2022 0.73  0.61 - 1.12 ng/dL Final   Comment: (NOTE) Biotin ingestion may interfere with free T4 tests. If the results are inconsistent with the TSH level, previous test results, or the clinical presentation, then consider biotin interference. If needed, order repeat testing after stopping biotin. Performed at [facility], [address], [place], [place] [address]    Preg Test, Ur 09/06/2022 Negative  Negative Final   Preg Test, Ur 09/06/2022 NEGATIVE  NEGATIVE Final   Comment:        THE SENSITIVITY OF THIS METHODOLOGY IS >24 mIU/mL    Preg Test, Ur 09/05/2022 NEGATIVE  NEGATIVE Final   Comment:        THE SENSITIVITY OF THIS METHODOLOGY IS >24 mIU/mL    Sodium 09/13/2022 136  135 - 145 mmol/L Final   Potassium 09/13/2022 4.5  3.5 - 5.1 mmol/L Final   Chloride 09/13/2022 105  98 - 111 mmol/L Final   CO2 09/13/2022 21 (L)  22 - 32 mmol/L Final   Glucose, Bld 09/13/2022 111 (H)  70 - 99 mg/dL Final   Glucose reference range applies only to samples taken after fasting for at least 8 hours.   BUN 09/13/2022 14  6 - 20 mg/dL Final   Creatinine, Ser 09/13/2022 0.92  0.44 - 1.00 mg/dL Final   Calcium 19/62/2297 9.1  8.9 - 10.3 mg/dL Final   GFR, Estimated  09/13/2022 >60  >60 mL/min Final   Comment: (NOTE) Calculated using the CKD-EPI Creatinine Equation (2021)    Anion gap 09/13/2022 10  5 - 15 Final   Performed at [facility], [address] [place], [place] [address]   Vitamin B-12 09/13/2022 585  180 - 914 pg/mL Final   Comment: (NOTE) This assay is not validated for testing neonatal or myeloproliferative syndrome specimens for Vitamin B12 levels. Performed at [facility], [address], [place], [place] [address]    Color, Urine 09/14/2022 YELLOW  YELLOW Final   APPearance 09/14/2022 HAZY (A)  CLEAR Final   Specific Gravity, Urine 09/14/2022 1.025  1.005 - 1.030 Final   pH 09/14/2022 5.0  5.0 - 8.0 Final   Glucose, UA 09/14/2022 NEGATIVE  NEGATIVE mg/dL Final   Hgb urine dipstick 09/14/2022 NEGATIVE  NEGATIVE Final   Bilirubin Urine 09/14/2022 NEGATIVE  NEGATIVE Final   Ketones, ur 09/14/2022 NEGATIVE  NEGATIVE mg/dL Final   Protein, ur 40/81/4481 NEGATIVE  NEGATIVE mg/dL Final   Nitrite 85/63/1497 NEGATIVE  NEGATIVE Final   Leukocytes,Ua 09/14/2022 TRACE (A)  NEGATIVE Final   RBC / HPF 09/14/2022 0-5  0 - 5 RBC/hpf Final   WBC, UA 09/14/2022 0-5  0 - 5 WBC/hpf Final   Bacteria, UA 09/14/2022 RARE (A)  NONE SEEN Final   Squamous Epithelial / LPF 09/14/2022 0-5  0 - 5  Final   Mucus 09/14/2022 PRESENT   Final   Performed at [facility], [address] [place], [place] [address]   Glucose-Capillary 09/16/2022 105 (H)  70 - 99 mg/dL Final   Glucose reference range applies only to samples taken after fasting for at least 8 hours.  Admission on 07/04/2022, Discharged on 07/05/2022  Component Date Value Ref Range Status   Sodium 07/04/2022 142  135 - 145 mmol/L Final   Potassium 07/04/2022 4.4  3.5 - 5.1 mmol/L Final   Chloride 07/04/2022 109  98 - 111 mmol/L Final   CO2 07/04/2022 26  22 - 32 mmol/L Final   Glucose, Bld 07/04/2022 96  70 - 99 mg/dL Final   Glucose reference range applies only to samples  taken after fasting for at least 8 hours.   BUN 07/04/2022 15  6 - 20 mg/dL Final   Creatinine, Ser 07/04/2022 0.83  0.44 - 1.00 mg/dL Final   Calcium 60/45/4098 9.3  8.9 - 10.3 mg/dL Final   Total Protein 11/91/4782 7.2  6.5 - 8.1 g/dL Final   Albumin 95/62/1308 3.9  3.5 - 5.0 g/dL Final   AST 65/78/4696 23  15 - 41 U/L Final   ALT 07/04/2022 28  0 - 44 U/L Final   Alkaline Phosphatase 07/04/2022 77  38 - 126 U/L Final   Total Bilirubin 07/04/2022 0.4  0.3 - 1.2 mg/dL Final   GFR, Estimated 07/04/2022 >60  >60 mL/min Final   Comment: (NOTE) Calculated using the CKD-EPI Creatinine Equation (2021)    Anion gap 07/04/2022 7  5 - 15 Final   Performed at [facility], [address], [place], [place] [address]   Alcohol, Ethyl (B) 07/04/2022 <10  <10 mg/dL Final   Comment: (NOTE) Lowest detectable limit for serum alcohol is 10 mg/dL.  For medical purposes only. Performed at [facility], [address], [place], [place] [address]    WBC 07/04/2022 7.0  4.0 - 10.5 K/uL Final   RBC 07/04/2022 4.18  3.87 - 5.11 MIL/uL Final   Hemoglobin 07/04/2022 12.8  12.0 - 15.0 g/dL Final   HCT 40/07/2724 39.1  36.0 - 46.0 % Final   MCV 07/04/2022 93.5  80.0 - 100.0 fL Final   MCH 07/04/2022 30.6  26.0 - 34.0 pg Final   MCHC 07/04/2022 32.7  30.0 - 36.0 g/dL Final   RDW 36/64/4034 12.9  11.5 - 15.5 % Final   Platelets 07/04/2022 243  150 - 400 K/uL Final   nRBC 07/04/2022 0.0  0.0 - 0.2 % Final   Neutrophils Relative % 07/04/2022 43  % Final   Neutro Abs 07/04/2022 3.0  1.7 - 7.7 K/uL Final   Lymphocytes Relative 07/04/2022 50  % Final   Lymphs Abs 07/04/2022 3.5  0.7 - 4.0 K/uL Final   Monocytes Relative 07/04/2022 7  % Final   Monocytes Absolute 07/04/2022 0.5  0.1 - 1.0 K/uL Final   Eosinophils Relative 07/04/2022 0  % Final   Eosinophils Absolute 07/04/2022 0.0  0.0 - 0.5 K/uL Final   Basophils Relative 07/04/2022 0  % Final   Basophils Absolute  07/04/2022 0.0  0.0 - 0.1 K/uL Final   Immature Granulocytes 07/04/2022 0  % Final   Abs Immature Granulocytes 07/04/2022 0.01  0.00 - 0.07 K/uL Final   Performed at [facility], [address] [name], [place] [address]   I-stat hCG, quantitative 07/04/2022 <5.0  <5 mIU/mL Final  Comment 3 07/04/2022          Final   Comment:   GEST. AGE      CONC.  (mIU/mL)   <=1 WEEK        5 - 50     2 WEEKS       50 - 500     3 WEEKS       100 - 10,000     4 WEEKS     1,000 - 30,000        FEMALE AND NON-PREGNANT FEMALE:     LESS THAN 5 mIU/mL   Admission on 06/14/2022, Discharged on 06/14/2022  Component Date Value Ref Range Status   Color, UA 06/14/2022 yellow  yellow Final   Clarity, UA 06/14/2022 cloudy (A)  clear Final   Glucose, UA 06/14/2022 negative  negative mg/dL Final   Bilirubin, UA 13/05/6577 negative  negative Final   Ketones, POC UA 06/14/2022 negative  negative mg/dL Final   Spec Grav, UA 46/96/2952 1.025  1.010 - 1.025 Final   Blood, UA 06/14/2022 negative  negative Final   pH, UA 06/14/2022 7.5  5.0 - 8.0 Final   Protein Ur, POC 06/14/2022 =30 (A)  negative mg/dL Final   Urobilinogen, UA 06/14/2022 0.2  0.2 or 1.0 E.U./dL Final   Nitrite, UA 84/13/2440 Negative  Negative Final   Leukocytes, UA 06/14/2022 Negative  Negative Final   Preg Test, Ur 06/14/2022 Negative  Negative Final   Specimen Description 06/14/2022 URINE, CLEAN CATCH   Final   Special Requests 06/14/2022    Final                   Value:NONE Performed at [facility], [address], [place], [place] [address]    Culture 06/14/2022 MULTIPLE SPECIES PRESENT, SUGGEST RECOLLECTION (A)   Final   Report Status 06/14/2022 06/16/2022 FINAL   Final  Admission on 06/07/2022, Discharged on 06/10/2022  Component Date Value Ref Range Status   SARS Coronavirus 2 by RT PCR 06/07/2022 NEGATIVE  NEGATIVE Final   Comment: (NOTE) SARS-CoV-2 target nucleic acids are NOT DETECTED.  The SARS-CoV-2  RNA is generally detectable in upper respiratory specimens during the acute phase of infection. The lowest concentration of SARS-CoV-2 viral copies this assay can detect is 138 copies/mL. A negative result does not preclude SARS-Cov-2 infection and should not be used as the sole basis for treatment or other patient management decisions. A negative result may occur with  improper specimen collection/handling, submission of specimen other than nasopharyngeal swab, presence of viral mutation(s) within the areas targeted by this assay, and inadequate number of viral copies(<138 copies/mL). A negative result must be combined with clinical observations, patient history, and epidemiological information. The expected result is Negative.  Fact Sheet for Patients:  BloggerCourse.com  Fact Sheet for Healthcare Providers:  SeriousBroker.it  This test is no                          t yet approved or cleared by the [place] FDA and  has been authorized for detection and/or diagnosis of SARS-CoV-2 by FDA under an Emergency Use Authorization (EUA). This EUA will remain  in effect (meaning this test can be used) for the duration of the COVID-19 declaration under Section 564(b)(1) of the Act, 21 U.S.C.section 360bbb-3(b)(1), unless the authorization is terminated  or revoked sooner.       Influenza A by PCR 06/07/2022 NEGATIVE  NEGATIVE Final   Influenza B by PCR 06/07/2022 NEGATIVE  NEGATIVE Final   Comment: (NOTE) The Xpert Xpress SARS-CoV-2/FLU/RSV plus assay is intended as an aid in the diagnosis of influenza from Nasopharyngeal swab specimens and should not be used as a sole basis for treatment. Nasal washings and aspirates are unacceptable for Xpert Xpress SARS-CoV-2/FLU/RSV testing.  Fact Sheet for Patients: BloggerCourse.com  Fact Sheet for Healthcare  Providers: SeriousBroker.it  This test is not yet approved or cleared by the [place] FDA and has been authorized for detection and/or diagnosis of SARS-CoV-2 by FDA under an Emergency Use Authorization (EUA). This EUA will remain in effect (meaning this test can be used) for the duration of the COVID-19 declaration under Section 564(b)(1) of the Act, 21 U.S.C. section 360bbb-3(b)(1), unless the authorization is terminated or revoked.  Performed at [facility], [address], [place], [place] [address]    WBC 06/07/2022 5.7  4.0 - 10.5 K/uL Final   RBC 06/07/2022 4.39  3.87 - 5.11 MIL/uL Final   Hemoglobin 06/07/2022 13.2  12.0 - 15.0 g/dL Final   HCT 60/45/4098 40.4  36.0 - 46.0 % Final   MCV 06/07/2022 92.0  80.0 - 100.0 fL Final   MCH 06/07/2022 30.1  26.0 - 34.0 pg Final   MCHC 06/07/2022 32.7  30.0 - 36.0 g/dL Final   RDW 11/91/4782 12.4  11.5 - 15.5 % Final   Platelets 06/07/2022 308  150 - 400 K/uL Final   nRBC 06/07/2022 0.0  0.0 - 0.2 % Final   Neutrophils Relative % 06/07/2022 42  % Final   Neutro Abs 06/07/2022 2.4  1.7 - 7.7 K/uL Final   Lymphocytes Relative 06/07/2022 54  % Final   Lymphs Abs 06/07/2022 3.1  0.7 - 4.0 K/uL Final   Monocytes Relative 06/07/2022 4  % Final   Monocytes Absolute 06/07/2022 0.2  0.1 - 1.0 K/uL Final   Eosinophils Relative 06/07/2022 0  % Final   Eosinophils Absolute 06/07/2022 0.0  0.0 - 0.5 K/uL Final   Basophils Relative 06/07/2022 0  % Final   Basophils Absolute 06/07/2022 0.0  0.0 - 0.1 K/uL Final   Immature Granulocytes 06/07/2022 0  % Final   Abs Immature Granulocytes 06/07/2022 0.01  0.00 - 0.07 K/uL Final   Performed at [facility], [address], [place], [place] [address]   Sodium 06/07/2022 139  135 - 145 mmol/L Final   Potassium 06/07/2022 4.0  3.5 - 5.1 mmol/L Final   Chloride 06/07/2022 104  98 - 111 mmol/L Final   CO2 06/07/2022 28  22 - 32 mmol/L Final   Glucose, Bld  06/07/2022 104 (H)  70 - 99 mg/dL Final   Glucose reference range applies only to samples taken after fasting for at least 8 hours.   BUN 06/07/2022 8  6 - 20 mg/dL Final   Creatinine, Ser 06/07/2022 0.84  0.44 - 1.00 mg/dL Final   Calcium 30/86/5784 9.1  8.9 - 10.3 mg/dL Final   Total Protein 69/62/9528 6.9  6.5 - 8.1 g/dL Final   Albumin 41/32/4401 3.7  3.5 - 5.0 g/dL Final   AST 02/72/5366 19  15 - 41 U/L Final   ALT 06/07/2022 22  0 - 44 U/L Final   Alkaline Phosphatase 06/07/2022 54  38 - 126 U/L Final   Total Bilirubin 06/07/2022 0.4  0.3 - 1.2 mg/dL Final   GFR, Estimated 06/07/2022 >60  >60 mL/min Final   Comment: (  NOTE) Calculated using the CKD-EPI Creatinine Equation (2021)    Anion gap 06/07/2022 7  5 - 15 Final   Performed at [facility], [address] [place], [place] [address]   Hgb A1c MFr Bld 06/07/2022 5.0  4.8 - 5.6 % Final   Comment: (NOTE) Pre diabetes:          5.7%-6.4%  Diabetes:              >6.4%  Glycemic control for   <7.0% adults with diabetes    Mean Plasma Glucose 06/07/2022 96.8  mg/dL Final   Performed at [facility], [address], [place], [place] [address]   TSH 06/07/2022 1.620  0.350 - 4.500 uIU/mL Final   Comment: Performed by a 3rd Generation assay with a functional sensitivity of <=0.01 uIU/mL. Performed at [facility], [address] [place], [place] [address]    RPR Ser Ql 06/07/2022 NON REACTIVE  NON REACTIVE Final   Performed at [facility], [address] [place], [place] [address]   Color, Urine 06/07/2022 YELLOW  YELLOW Final   APPearance 06/07/2022 HAZY (A)  CLEAR Final   Specific Gravity, Urine 06/07/2022 1.018  1.005 - 1.030 Final   pH 06/07/2022 7.0  5.0 - 8.0 Final   Glucose, UA 06/07/2022 NEGATIVE  NEGATIVE mg/dL Final   Hgb urine dipstick 06/07/2022 NEGATIVE  NEGATIVE Final   Bilirubin Urine 06/07/2022 NEGATIVE  NEGATIVE Final   Ketones, ur 06/07/2022 NEGATIVE  NEGATIVE mg/dL Final    Protein, ur 16/55/3748 NEGATIVE  NEGATIVE mg/dL Final   Nitrite 27/04/8674 NEGATIVE  NEGATIVE Final   Leukocytes,Ua 06/07/2022 NEGATIVE  NEGATIVE Final   Performed at [facility], [address] [place], [place] [address]   Cholesterol 06/07/2022 164  0 - 200 mg/dL Final   Triglycerides 07/20/1218 158 (H)  <150 mg/dL Final   HDL 75/88/3254 44  >40 mg/dL Final   Total CHOL/HDL Ratio 06/07/2022 3.7  RATIO Final   VLDL 06/07/2022 32  0 - 40 mg/dL Final   LDL Cholesterol 06/07/2022 88  0 - 99 mg/dL Final   Comment:        Total Cholesterol/HDL:CHD Risk Coronary Heart Disease Risk Table                     Men   Women  1/2 Average Risk   3.4   3.3  Average Risk       5.0   4.4  2 X Average Risk   9.6   7.1  3 X Average Risk  23.4   11.0        Use the calculated Patient Ratio above and the CHD Risk Table to determine the patient's CHD Risk.        ATP III CLASSIFICATION (LDL):  <100     mg/dL   Optimal  982-641  mg/dL   Near or Above                    Optimal  130-159  mg/dL   Borderline  583-094  mg/dL   High  >076     mg/dL   Very High Performed at [facility], [address], [place], [place] [address]    HIV Screen 4th Generation wRfx 06/07/2022 Non Reactive  Non Reactive Final   Performed at [facility], [address], [place], [place] [address]   SARSCOV2ONAVIRUS  2 AG 06/07/2022 NEGATIVE  NEGATIVE Final   Comment: (NOTE) SARS-CoV-2 antigen NOT DETECTED.   Negative results are presumptive.  Negative results do not preclude SARS-CoV-2 infection and should not be used as the sole basis for treatment or other patient management decisions, including infection  control decisions, particularly in the presence of clinical signs and  symptoms consistent with COVID-19, or in those who have been in contact with the virus.  Negative results must be combined with clinical observations, patient history, and epidemiological information. The expected result is  Negative.  Fact Sheet for Patients: https://www.jennings-kim.com/  Fact Sheet for Healthcare Providers: https://alexander-rogers.biz/  This test is not yet approved or cleared by the [place] FDA and  has been authorized for detection and/or diagnosis of SARS-CoV-2 by FDA under an Emergency Use Authorization (EUA).  This EUA will remain in effect (meaning this test can be used) for the duration of  the COV                          ID-19 declaration under Section 564(b)(1) of the Act, 21 U.S.C. section 360bbb-3(b)(1), unless the authorization is terminated or revoked sooner.     POC Amphetamine UR 06/07/2022 None Detected  NONE DETECTED (Cut Off Level 1000 ng/mL) Final   POC Secobarbital (BAR) 06/07/2022 None Detected  NONE DETECTED (Cut Off Level 300 ng/mL) Final   POC Buprenorphine (BUP) 06/07/2022 None Detected  NONE DETECTED (Cut Off Level 10 ng/mL) Final   POC Oxazepam (BZO) 06/07/2022 None Detected  NONE DETECTED (Cut Off Level 300 ng/mL) Final   POC Cocaine UR 06/07/2022 None Detected  NONE DETECTED (Cut Off Level 300 ng/mL) Final   POC Methamphetamine UR 06/07/2022 None Detected  NONE DETECTED (Cut Off Level 1000 ng/mL) Final   POC Morphine 06/07/2022 None Detected  NONE DETECTED (Cut Off Level 300 ng/mL) Final   POC Methadone UR 06/07/2022 None Detected  NONE DETECTED (Cut Off Level 300 ng/mL) Final   POC Oxycodone UR 06/07/2022 None Detected  NONE DETECTED (Cut Off Level 100 ng/mL) Final   POC Marijuana UR 06/07/2022 None Detected  NONE DETECTED (Cut Off Level 50 ng/mL) Final   Preg Test, Ur 06/08/2022 NEGATIVE  NEGATIVE Final   Comment:        THE SENSITIVITY OF THIS METHODOLOGY IS >20 mIU/mL. Performed at [facility], [address], [place], [place] [address]    Preg Test, Ur 06/08/2022 NEGATIVE  NEGATIVE Final   Comment:        THE SENSITIVITY OF THIS METHODOLOGY IS >24 mIU/mL     Blood Alcohol level:  Lab Results  Component  Value Date   ETH <10 07/04/2022   ETH <10 02/07/2022    Metabolic Disorder Labs: Lab Results  Component Value Date   HGBA1C 5.0 06/07/2022   MPG 96.8 06/07/2022   MPG 96.8 02/07/2022   No results found for: "PROLACTIN" Lab Results  Component Value Date   CHOL 181 07/25/2022   TRIG 118 07/25/2022   HDL 45 07/25/2022   CHOLHDL 4.0 07/25/2022   VLDL 24 07/25/2022   LDLCALC 112 (H) 07/25/2022   LDLCALC 88 06/07/2022    Therapeutic Lab Levels: No results found for: "LITHIUM" No results found for: "VALPROATE" No results found for: "CBMZ"  Physical Findings   GAD-7    Flowsheet Row [facility]17/[facility]  Total GAD-7 Score 17  GNF6-2    Flowsheet Row ED from 06/07/2022 in [facility] Office Visit from 12/17/2021 [facility]  PHQ-2 Total Score 1 2  PHQ-9 Total Score 2 8      Flowsheet Row ED from 07/25/2022 in [facility] ED from 07/04/2022 in [place]  [facility]PITAL-[facility]ERGENCY DEPT ED from 06/14/2022 in [facility]   C-SSRS RISK CATEGORY No Risk No Risk No Risk        Musculoskeletal  Strength & Muscle Tone: within normal limits Gait & Station: normal Patient leans: N/A  Psychiatric Specialty Exam  Presentation  General Appearance:  Appropriate for Environment; Casual  Eye Contact: Good  Speech: Clear and Coherent; Normal Rate  Speech Volume: Normal  Handedness: Right   Mood and Affect  Mood: Euthymic  Affect: Appropriate; Congruent   Thought Process  Thought Processes: Coherent; Goal Directed  Descriptions of Associations:Intact  Orientation:Full (Time, Place and Person)  Thought Content:Logical  Diagnosis of Schizophrenia or Schizoaffective disorder in past: Yes  Duration of Psychotic Symptoms: Greater than six months   Hallucinations:Hallucinations:  None  Ideas of Reference:None  Suicidal Thoughts:Suicidal Thoughts: No  Homicidal Thoughts:Homicidal Thoughts: No   Sensorium  Memory: Immediate Good; Recent Good; Remote Fair  Judgment: Intact  Insight: Present; Good   Executive Functions  Concentration: Good  Attention Span: Good  Recall: Good  Fund of Knowledge: Good  Language: Good   Psychomotor Activity  Psychomotor Activity: Psychomotor Activity: Normal   Assets  Assets: Communication Skills; Desire for Improvement; Financial Resources/Insurance   Sleep  Sleep: Sleep: Good   Nutritional Assessment (For OBS and FBC admissions only) Has the patient had a weight loss or gain of 10 pounds or more in the last 3 months?: No Has the patient had a decrease in food intake/or appetite?: No Does the patient have dental problems?: No Does the patient have eating habits or behaviors that may be indicators of an eating disorder including binging or inducing vomiting?: No Has the patient recently lost weight without trying?: 0 Has the patient been eating poorly because of a decreased appetite?: 0 Malnutrition Screening Tool Score: 0   Physical Exam  Physical Exam Constitutional:      General: She is not in acute distress.    Appearance: She is not ill-appearing, toxic-appearing or diaphoretic.  Eyes:     Conjunctiva/sclera: Conjunctivae normal.  Cardiovascular:     Rate and Rhythm: Normal rate.  Pulmonary:     Effort: Pulmonary effort is normal. No respiratory distress.  Musculoskeletal:        General: Normal range of motion.     Cervical back: Normal range of motion.  Neurological:     Mental Status: She is alert and oriented to person, place, and time.  Psychiatric:        Attention and Perception: Attention and perception normal. She does not perceive auditory or visual hallucinations.        Mood and Affect: Mood and affect normal.        Speech: Speech normal.        Behavior: Behavior  normal. Behavior is cooperative.        Thought Content: Thought content normal. Thought content is not paranoid or delusional. Thought content does not include homicidal or suicidal ideation.    Review of Systems  Constitutional:  Negative for chills and fever.  Respiratory:  Negative for shortness of breath.   Cardiovascular:  Negative for chest pain and palpitations.  Musculoskeletal: Negative.   Neurological:  Negative for headaches.  Psychiatric/Behavioral: Negative.    All other systems reviewed and are negative.  Blood pressure 120/77, pulse 84, temperature 98 F (36.7 C), temperature source Oral, resp. rate 18, SpO2 96 %. There is no height or weight on file to calculate BMI.  Treatment Plan Summary: Patient continues to be psychiatrically cleared.  Social work/TOC to continue working with DSS for appropriate placement   No change in medication regimen   Secure message sent to social worker Teacher, English as a foreign language [place]) informing:  Please stop by and speak to patient.  Just wants update on placement, hold up and if there is anything she can to help speed up.  Told her you would stop by to give her and update.   Also, is there any way to speak to the supervisor of the case worker to see what the hold up is now and try to get a time limit on when this can happen since she already has a place to go, and they are currently the hold up for patient being placed.  She has been her in the [facility] for 2 months probably closer to 3.     Nicole Mangini, NP 09/24/2022 11:18 AM

## 2022-09-24 NOTE — ED Notes (Signed)
Pt sleeping at present, no distress noted.  Monitoring for safety. 

## 2022-09-24 NOTE — ED Notes (Signed)
Pt pleasant cooperative, interacting appropriately with staff. She has been laughing and joking with staff on the unit. Given breakfast and compliant with am medications. She denies any acute concerns at this time. She is safe, able to make needs known. Denies any SI HI or AVH .

## 2022-09-24 NOTE — Progress Notes (Signed)
[name] Progress Note   1304 - [name] spoke with Gilda Crease, Medical Records at [facility] [phone], to inquire about any records that would be present at this facility if in fact she had received services from there.  Ms. Earlene Plater could not relinquish any type of information regarding services without a release of consent from the pt's legal guardian.  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 A voice message was left requesting a call back.   Hansel Starling, MSW, [name] [facility] [phone] phone [phone] fax

## 2022-09-24 NOTE — ED Notes (Signed)
Pt at nursing desk talking with staff about  different topics. Alert and orient x 4. No c/o pain or distress. Will continue to monitor for safety

## 2022-09-25 MED ORDER — SIMETHICONE 80 MG PO CHEW
80.0000 mg | CHEWABLE_TABLET | Freq: Four times a day (QID) | ORAL | Status: DC | PRN
  Administered 2022-09-25 – 2022-09-29 (×2): 80 mg via ORAL
  Filled 2022-09-25 (×2): qty 1

## 2022-09-25 NOTE — ED Notes (Signed)
Pt sleeping@this time. Breathing even and unlabored. Will continue to monitor for safety 

## 2022-09-25 NOTE — Progress Notes (Signed)
[name] Progress Note  [name] was contacted by Roderic Palau, [place] [employer], regarding release of necessary documentation from pt's previous group home.  [name] explained the situation to Ms. Tanja Port and asked for her assistance in determining the "[facility]" Ms. Rockney Ghee was referring to when stating "they can get the documentation from there."    [name] later received an email from Ms. Tanja Port @ [phone] stating the pt had received inpatient treatment at [facility] from 09 June 2019 to 27 August 2019 before being admitted into her previous group home. [name] requested to have this paperwork sent to be forwarded to Ms. Elmarie Mainland for review.  [name] spoke with the pt to help ease her concerns and listen to her strategy for obtaining documentation from other sources.  [name] had to remind the pt of the constraints of HIPAA laws as how the clinician is able to obtain information, I.e. having legal guardian sign releases of information versus the pt signing the ROI's without the legal guardian's signature.   Hansel Starling, MSW, [name] [facility] [phone] phone [phone] fax

## 2022-09-25 NOTE — ED Notes (Signed)
Pt is in the bed resting. Respirations are even and unlabored. No acute distress noted. Will continue to monitor for safety 

## 2022-09-25 NOTE — Progress Notes (Signed)
Nicole [name] has been in better spirits after talking with the Child psychotherapist.

## 2022-09-25 NOTE — ED Notes (Signed)
Pt was given dinner. 

## 2022-09-25 NOTE — ED Provider Notes (Signed)
Behavioral Health Progress Note  Date and Time: 09/25/2022 11:08 AM Name: Nicole [name] MRN:  [id number]  Subjective:  Patient states " I may have a place, I just ned some more information, then hopefully I can go!"  Patient is reassessed, face-to-face, by nurse practitioner.  She is seated in observation unit upon my approach, she is completing coloring sheets.  She is alert and oriented, pleasant and cooperative during assessment.  She presents with euthymic mood, bright affect.  Nicole [name] continues to deny suicidal and homicidal ideation.  She denies auditory and visual hallucinations.  There is no evidence of delusional thought content and no indication that patient is responding to internal stimuli.  Patient presents with appropriate behavior while at [facility].  She is engaged with peers and staff.  She is compliant with medications.  No behavioral outburst noted this shift.  Patient offered support and encouragement.  She remains cleared by psychiatry.  Transition of care team continues to seek disposition.   Nicole [name] is a 31 y.o. female, with PMH of SCZ unspecified, MDD with psychosis, who presented voluntary to [facility] (07/25/2022) via [facility] after a verbal altercation with [name] (not patient's legal guardian) for transient SI.  This is patient's fourth visit to [facility] for similar concerns this year. Patient has been dismissed from previous group home due to threatening SI and HI, then leaving the group home.   Legal Guardian: [name]  Diagnosis:  Final diagnoses:  Severe episode of recurrent major depressive disorder, with psychotic features (HCC)    Total Time spent with patient: 20 minutes  Past Psychiatric History: see H & P Past Medical History:  Past Medical History:  Diagnosis Date   Anxiety    Depression    Hypothyroidism 08/07/2022   Tobacco use  disorder 08/07/2022    Past Surgical History:  Procedure Laterality Date   WISDOM TOOTH EXTRACTION Bilateral 2020   Family History:  Family History  Problem Relation Age of Onset   Hypertension Father    Diabetes Father    Family Psychiatric  History: see H & P Social History:  Social History   Substance and Sexual Activity  Alcohol Use Never     Social History   Substance and Sexual Activity  Drug Use Never    Social History   Socioeconomic History   Marital status: Single    Spouse name: Not on file   Number of children: Not on file   Years of education: Not on file   Highest education level: Not on file  Occupational History   Not on file  Tobacco Use   Smoking status: Every Day    Types: Cigarettes   Smokeless tobacco: Never  Vaping Use   Vaping Use: Some days  Substance and Sexual Activity   Alcohol use: Never   Drug use: Never   Sexual activity: Yes    Partners: Female    Birth control/protection: Implant  Other Topics Concern   Not on file  Social History Narrative   Not on file   Social Determinants of Health   Financial Resource Strain: Not on file  Food Insecurity: Not on file  Transportation Needs: Not on file  Physical Activity: Not on file  Stress: Not on file  Social Connections: Not on file   SDOH:  SDOH Screenings   Depression (PHQ2-9): Low Risk  (06/10/2022)  Tobacco Use: High Risk (09/24/2022)  Additional Social History:    Pain Medications: SEE MAR Prescriptions: SEE MAR Over the Counter: SEE MAR History of alcohol / drug use?: Yes Longest period of sobriety (when/how long): N/A Negative Consequences of Use:  (NONE) Withdrawal Symptoms: None Name of Substance 1: ETOH IN PAST--PT CURRENTLY DENIES 1 - Frequency: SOCIAL USE ETOH IN PAST 1 - Method of Aquiring: LEGAL 1- Route of Use: ORAL DRINK Name of Substance 2: [place] 2 - Amount (size/oz): LESS THAN ONE PACK 2 - Frequency: DAILY 2 - Method of  Aquiring: LEGAL 2 - Route of Substance Use: ORAL SMOKE                Sleep: Good  Appetite:  Good  Current Medications:  Current Facility-Administered Medications  Medication Dose Route Frequency Provider Last Rate Last Admin   acetaminophen (TYLENOL) tablet 650 mg  650 mg Oral Q6H PRN Onuoha, Chinwendu V, NP   650 mg at 09/21/22 2200   ARIPiprazole ER (ABILIFY MAINTENA) injection 400 mg  400 mg Intramuscular Q28 days Merrily Brittle, DO   400 mg at 08/30/22 1338   docusate sodium (COLACE) capsule 100 mg  100 mg Oral Daily Rosezetta Schlatter, MD   100 mg at 09/25/22 0924   hydrOXYzine (ATARAX) tablet 25 mg  25 mg Oral TID PRN Onuoha, Chinwendu V, NP   25 mg at 09/24/22 2119   ibuprofen (ADVIL) tablet 400 mg  400 mg Oral Q6H PRN Onuoha, Chinwendu V, NP   400 mg at 09/20/22 2115   levothyroxine (SYNTHROID) tablet 100 mcg  100 mcg Oral Q0600 Merrily Brittle, DO   100 mcg at 09/25/22 0618   loratadine (CLARITIN) tablet 10 mg  10 mg Oral Daily Rankin, Shuvon B, NP   10 mg at 09/25/22 [id number]   metFORMIN (GLUCOPHAGE) tablet 500 mg  500 mg Oral Q breakfast France Ravens, MD   500 mg at 09/25/22 [id number]   nicotine (NICODERM CQ - dosed in mg/24 hours) patch 14 mg  14 mg Transdermal Q0600 Onuoha, Chinwendu V, NP   14 mg at 09/25/22 0924   ondansetron (ZOFRAN-ODT) disintegrating tablet 4 mg  4 mg Oral Q8H PRN Evette Georges, NP   4 mg at 09/14/22 2351   Oxcarbazepine (TRILEPTAL) tablet 300 mg  300 mg Oral BID Onuoha, Chinwendu V, NP   300 mg at 09/25/22 0923   pantoprazole (PROTONIX) EC tablet 20 mg  20 mg Oral Daily Merrily Brittle, DO   20 mg at 09/25/22 0929   QUEtiapine (SEROQUEL) tablet 400 mg  400 mg Oral BID Onuoha, Chinwendu V, NP   400 mg at 09/25/22 0923   sertraline (ZOLOFT) tablet 150 mg  150 mg Oral Daily Onuoha, Chinwendu V, NP   150 mg at 09/25/22 [id number]   traZODone (DESYREL) tablet 100 mg  100 mg Oral QHS Revonda Humphrey, NP   100 mg at 09/24/22 2120   valACYclovir (VALTREX) tablet 500 mg  500 mg  Oral Daily Merrily Brittle, DO   500 mg at 09/25/22 [id number]   ziprasidone (GEODON) injection 20 mg  20 mg Intramuscular Q12H PRN Onuoha, Chinwendu V, NP   20 mg at 09/19/22 2133   Current Outpatient Medications  Medication Sig Dispense Refill   ABILIFY MAINTENA 400 MG PRSY prefilled syringe 400 mg every 28 (twenty-eight) days.     cetirizine (ZYRTEC) 10 MG tablet Take 10 mg by mouth daily.     cyclobenzaprine (FLEXERIL) 10 MG tablet Take 1 tablet (10 mg total)  by mouth 2 (two) times daily as needed for muscle spasms. 20 tablet 0   fluticasone (FLONASE) 50 MCG/ACT nasal spray Place 1 spray into both nostrils daily.     meloxicam (MOBIC) 15 MG tablet Take 15 mg by mouth daily.     Oxcarbazepine (TRILEPTAL) 300 MG tablet Take 1 tablet (300 mg total) by mouth 2 (two) times daily. 60 tablet 0   QUEtiapine (SEROQUEL) 400 MG tablet Take 1 tablet (400 mg total) by mouth 2 (two) times daily. 60 tablet 0   sertraline (ZOLOFT) 50 MG tablet Take 3 tablets (150 mg total) by mouth in the morning. 90 tablet 0   traZODone (DESYREL) 100 MG tablet Take 1 tablet (100 mg total) by mouth at bedtime. 30 tablet 0   valACYclovir (VALTREX) 500 MG tablet Take 500 mg by mouth daily.      Labs  Lab Results:  Admission on 07/25/2022  Component Date Value Ref Range Status   SARS Coronavirus 2 by RT PCR 07/25/2022 NEGATIVE  NEGATIVE Final   Comment: (NOTE) SARS-CoV-2 target nucleic acids are NOT DETECTED.  The SARS-CoV-2 RNA is generally detectable in upper respiratory specimens during the acute phase of infection. The lowest concentration of SARS-CoV-2 viral copies this assay can detect is 138 copies/mL. A negative result does not preclude SARS-Cov-2 infection and should not be used as the sole basis for treatment or other patient management decisions. A negative result may occur with  improper specimen collection/handling, submission of specimen other than nasopharyngeal swab, presence of viral mutation(s) within  the areas targeted by this assay, and inadequate number of viral copies(<138 copies/mL). A negative result must be combined with clinical observations, patient history, and epidemiological information. The expected result is Negative.  Fact Sheet for Patients:  EntrepreneurPulse.com.au  Fact Sheet for Healthcare Providers:  IncredibleEmployment.be  This test is no                          t yet approved or cleared by the [place] FDA and  has been authorized for detection and/or diagnosis of SARS-CoV-2 by FDA under an Emergency Use Authorization (EUA). This EUA will remain  in effect (meaning this test can be used) for the duration of the COVID-19 declaration under Section 564(b)(1) of the Act, 21 U.S.C.section 360bbb-3(b)(1), unless the authorization is terminated  or revoked sooner.       Influenza A by PCR 07/25/2022 NEGATIVE  NEGATIVE Final   Influenza B by PCR 07/25/2022 NEGATIVE  NEGATIVE Final   Comment: (NOTE) The Xpert Xpress SARS-CoV-2/FLU/RSV plus assay is intended as an aid in the diagnosis of influenza from Nasopharyngeal swab specimens and should not be used as a sole basis for treatment. Nasal washings and aspirates are unacceptable for Xpert Xpress SARS-CoV-2/FLU/RSV testing.  Fact Sheet for Patients: EntrepreneurPulse.com.au  Fact Sheet for Healthcare Providers: IncredibleEmployment.be  This test is not yet approved or cleared by the [place] FDA and has been authorized for detection and/or diagnosis of SARS-CoV-2 by FDA under an Emergency Use Authorization (EUA). This EUA will remain in effect (meaning this test can be used) for the duration of the COVID-19 declaration under Section 564(b)(1) of the Act, 21 U.S.C. section 360bbb-3(b)(1), unless the authorization is terminated or revoked.  Performed at [facility], [place] [address] [place], [place] [address]    WBC  07/25/2022 8.3  4.0 - 10.5 K/uL Final   RBC 07/25/2022 4.44  3.87 - 5.11 MIL/uL  Final   Hemoglobin 07/25/2022 13.7  12.0 - 15.0 g/dL Final   HCT 07/25/2022 40.2  36.0 - 46.0 % Final   MCV 07/25/2022 90.5  80.0 - 100.0 fL Final   MCH 07/25/2022 30.9  26.0 - 34.0 pg Final   MCHC 07/25/2022 34.1  30.0 - 36.0 g/dL Final   RDW 07/25/2022 12.2  11.5 - 15.5 % Final   Platelets 07/25/2022 248  150 - 400 K/uL Final   nRBC 07/25/2022 0.0  0.0 - 0.2 % Final   Neutrophils Relative % 07/25/2022 43  % Final   Neutro Abs 07/25/2022 3.6  1.7 - 7.7 K/uL Final   Lymphocytes Relative 07/25/2022 52  % Final   Lymphs Abs 07/25/2022 4.2 (H)  0.7 - 4.0 K/uL Final   Monocytes Relative 07/25/2022 5  % Final   Monocytes Absolute 07/25/2022 0.4  0.1 - 1.0 K/uL Final   Eosinophils Relative 07/25/2022 0  % Final   Eosinophils Absolute 07/25/2022 0.0  0.0 - 0.5 K/uL Final   Basophils Relative 07/25/2022 0  % Final   Basophils Absolute 07/25/2022 0.0  0.0 - 0.1 K/uL Final   Immature Granulocytes 07/25/2022 0  % Final   Abs Immature Granulocytes 07/25/2022 0.02  0.00 - 0.07 K/uL Final   Performed at [facility], [place] [address], [place], [place] [address]   Sodium 07/25/2022 138  135 - 145 mmol/L Final   Potassium 07/25/2022 4.0  3.5 - 5.1 mmol/L Final   Chloride 07/25/2022 104  98 - 111 mmol/L Final   CO2 07/25/2022 29  22 - 32 mmol/L Final   Glucose, Bld 07/25/2022 83  70 - 99 mg/dL Final   Glucose reference range applies only to samples taken after fasting for at least 8 hours.   BUN 07/25/2022 11  6 - 20 mg/dL Final   Creatinine, Ser 07/25/2022 0.97  0.44 - 1.00 mg/dL Final   Calcium 07/25/2022 9.2  8.9 - 10.3 mg/dL Final   Total Protein 07/25/2022 7.0  6.5 - 8.1 g/dL Final   Albumin 07/25/2022 3.8  3.5 - 5.0 g/dL Final   AST 07/25/2022 18  15 - 41 U/L Final   ALT 07/25/2022 22  0 - 44 U/L Final   Alkaline Phosphatase 07/25/2022 64  38 - 126 U/L Final   Total Bilirubin 07/25/2022 0.2 (L)  0.3 - 1.2  mg/dL Final   GFR, Estimated 07/25/2022 >60  >60 mL/min Final   Comment: (NOTE) Calculated using the CKD-EPI Creatinine Equation (2021)    Anion gap 07/25/2022 5  5 - 15 Final   Performed at [place] [address], [place], [place] [address]   POC Amphetamine UR 07/25/2022 None Detected  NONE DETECTED (Cut Off Level 1000 ng/mL) Preliminary   POC Secobarbital (BAR) 07/25/2022 None Detected  NONE DETECTED (Cut Off Level 300 ng/mL) Preliminary   POC Buprenorphine (BUP) 07/25/2022 None Detected  NONE DETECTED (Cut Off Level 10 ng/mL) Preliminary   POC Oxazepam (BZO) 07/25/2022 None Detected  NONE DETECTED (Cut Off Level 300 ng/mL) Preliminary   POC Cocaine UR 07/25/2022 None Detected  NONE DETECTED (Cut Off Level 300 ng/mL) Preliminary   POC Methamphetamine UR 07/25/2022 None Detected  NONE DETECTED (Cut Off Level 1000 ng/mL) Preliminary   POC Morphine 07/25/2022 None Detected  NONE DETECTED (Cut Off Level 300 ng/mL) Preliminary   POC Methadone UR 07/25/2022 None Detected  NONE DETECTED (Cut Off Level 300 ng/mL) Preliminary   POC Oxycodone UR 07/25/2022  None Detected  NONE DETECTED (Cut Off Level 100 ng/mL) Preliminary   POC Marijuana UR 07/25/2022 None Detected  NONE DETECTED (Cut Off Level 50 ng/mL) Preliminary   SARSCOV2ONAVIRUS 2 AG 07/25/2022 NEGATIVE  NEGATIVE Final   Comment: (NOTE) SARS-CoV-2 antigen NOT DETECTED.   Negative results are presumptive.  Negative results do not preclude SARS-CoV-2 infection and should not be used as the sole basis for treatment or other patient management decisions, including infection  control decisions, particularly in the presence of clinical signs and  symptoms consistent with COVID-19, or in those who have been in contact with the virus.  Negative results must be combined with clinical observations, patient history, and epidemiological information. The expected result is Negative.  Fact Sheet for Patients:  HandmadeRecipes.com.cy  Fact Sheet for Healthcare Providers: FuneralLife.at  This test is not yet approved or cleared by the [place] FDA and  has been authorized for detection and/or diagnosis of SARS-CoV-2 by FDA under an Emergency Use Authorization (EUA).  This EUA will remain in effect (meaning this test can be used) for the duration of  the COV                          ID-19 declaration under Section 564(b)(1) of the Act, 21 U.S.C. section 360bbb-3(b)(1), unless the authorization is terminated or revoked sooner.     Cholesterol 07/25/2022 181  0 - 200 mg/dL Final   Triglycerides 07/25/2022 118  <150 mg/dL Final   HDL 07/25/2022 45  >40 mg/dL Final   Total CHOL/HDL Ratio 07/25/2022 4.0  RATIO Final   VLDL 07/25/2022 24  0 - 40 mg/dL Final   LDL Cholesterol 07/25/2022 112 (H)  0 - 99 mg/dL Final   Comment:        Total Cholesterol/HDL:CHD Risk Coronary Heart Disease Risk Table                     Men   Women  1/2 Average Risk   3.4   3.3  Average Risk       5.0   4.4  2 X Average Risk   9.6   7.1  3 X Average Risk  23.4   11.0        Use the calculated Patient Ratio above and the CHD Risk Table to determine the patient's CHD Risk.        ATP III CLASSIFICATION (LDL):  <100     mg/dL   Optimal  100-129  mg/dL   Near or Above                    Optimal  130-159  mg/dL   Borderline  160-189  mg/dL   High  >190     mg/dL   Very High Performed at [facility] [address] [place], [place] [address]    TSH 07/25/2022 6.668 (H)  0.350 - 4.500 uIU/mL Final   Comment: Performed by a 3rd Generation assay with a functional sensitivity of <=0.01 uIU/mL. Performed at [facility], [place] [address] [place], [place] [address]    Glucose-Capillary 07/26/2022 104 (H)  70 - 99 mg/dL Final   Glucose reference range applies only to samples taken after fasting for at least 8 hours.   T3, Free 07/31/2022 2.3  2.0 - 4.4  pg/mL Final   Comment: (NOTE) Performed At: [facility] [address] [place], [place] [id number] Perlie Gold  Derinda Late MD [id number]    Free T4 07/31/2022 0.60 (L)  0.61 - 1.12 ng/dL Final   Comment: (NOTE) Biotin ingestion may interfere with free T4 tests. If the results are inconsistent with the TSH level, previous test results, or the clinical presentation, then consider biotin interference. If needed, order repeat testing after stopping biotin. Performed at [facility], [place] [address] [place], [place] [address]    Glucose-Capillary 08/29/2022 100 (H)  70 - 99 mg/dL Final   Glucose reference range applies only to samples taken after fasting for at least 8 hours.   TSH 09/05/2022 0.793  0.350 - 4.500 uIU/mL Final   Comment: Performed by a 3rd Generation assay with a functional sensitivity of <=0.01 uIU/mL. Performed at [facility], [place] [address] [place], [place] [address]    Free T4 09/05/2022 0.73  0.61 - 1.12 ng/dL Final   Comment: (NOTE) Biotin ingestion may interfere with free T4 tests. If the results are inconsistent with the TSH level, previous test results, or the clinical presentation, then consider biotin interference. If needed, order repeat testing after stopping biotin. Performed at [facility], [place] [address], [place], [place] [address]    Preg Test, Ur 09/06/2022 Negative  Negative Final   Preg Test, Ur 09/06/2022 NEGATIVE  NEGATIVE Final   Comment:        THE SENSITIVITY OF THIS METHODOLOGY IS >24 mIU/mL    Preg Test, Ur 09/05/2022 NEGATIVE  NEGATIVE Final   Comment:        THE SENSITIVITY OF THIS METHODOLOGY IS >24 mIU/mL    Sodium 09/13/2022 136  135 - 145 mmol/L Final   Potassium 09/13/2022 4.5  3.5 - 5.1 mmol/L Final   Chloride 09/13/2022 105  98 - 111 mmol/L Final   CO2 09/13/2022 21 (L)  22 - 32 mmol/L Final   Glucose, Bld 09/13/2022 111 (H)  70 - 99 mg/dL Final   Glucose reference range applies only to samples taken  after fasting for at least 8 hours.   BUN 09/13/2022 14  6 - 20 mg/dL Final   Creatinine, Ser 09/13/2022 0.92  0.44 - 1.00 mg/dL Final   Calcium 09/13/2022 9.1  8.9 - 10.3 mg/dL Final   GFR, Estimated 09/13/2022 >60  >60 mL/min Final   Comment: (NOTE) Calculated using the CKD-EPI Creatinine Equation (2021)    Anion gap 09/13/2022 10  5 - 15 Final   Performed at [facility], [place] [address], [place], [place] [address]   Vitamin B-12 09/13/2022 585  180 - 914 pg/mL Final   Comment: (NOTE) This assay is not validated for testing neonatal or myeloproliferative syndrome specimens for Vitamin B12 levels. Performed at [facility], [place] [address] [place], [place] [address]    Color, Urine 09/14/2022 YELLOW  YELLOW Final   APPearance 09/14/2022 HAZY (A)  CLEAR Final   Specific Gravity, Urine 09/14/2022 1.025  1.005 - 1.030 Final   pH 09/14/2022 5.0  5.0 - 8.0 Final   Glucose, UA 09/14/2022 NEGATIVE  NEGATIVE mg/dL Final   Hgb urine dipstick 09/14/2022 NEGATIVE  NEGATIVE Final   Bilirubin Urine 09/14/2022 NEGATIVE  NEGATIVE Final   Ketones, ur 09/14/2022 NEGATIVE  NEGATIVE mg/dL Final   Protein, ur 09/14/2022 NEGATIVE  NEGATIVE mg/dL Final   Nitrite 09/14/2022 NEGATIVE  NEGATIVE Final   Leukocytes,Ua 09/14/2022 TRACE (A)  NEGATIVE Final   RBC / HPF 09/14/2022 0-5  0 - 5 RBC/hpf Final  WBC, UA 09/14/2022 0-5  0 - 5 WBC/hpf Final   Bacteria, UA 09/14/2022 RARE (A)  NONE SEEN Final   Squamous Epithelial / LPF 09/14/2022 0-5  0 - 5 Final   Mucus 09/14/2022 PRESENT   Final   Performed at [facility], [place] [address], [place], [place] [address]   Glucose-Capillary 09/16/2022 105 (H)  70 - 99 mg/dL Final   Glucose reference range applies only to samples taken after fasting for at least 8 hours.  Admission on 07/04/2022, Discharged on 07/05/2022  Component Date Value Ref Range Status   Sodium 07/04/2022 142  135 - 145 mmol/L Final   Potassium 07/04/2022 4.4  3.5 - 5.1 mmol/L  Final   Chloride 07/04/2022 109  98 - 111 mmol/L Final   CO2 07/04/2022 26  22 - 32 mmol/L Final   Glucose, Bld 07/04/2022 96  70 - 99 mg/dL Final   Glucose reference range applies only to samples taken after fasting for at least 8 hours.   BUN 07/04/2022 15  6 - 20 mg/dL Final   Creatinine, Ser 07/04/2022 0.83  0.44 - 1.00 mg/dL Final   Calcium 07/04/2022 9.3  8.9 - 10.3 mg/dL Final   Total Protein 07/04/2022 7.2  6.5 - 8.1 g/dL Final   Albumin 07/04/2022 3.9  3.5 - 5.0 g/dL Final   AST 07/04/2022 23  15 - 41 U/L Final   ALT 07/04/2022 28  0 - 44 U/L Final   Alkaline Phosphatase 07/04/2022 77  38 - 126 U/L Final   Total Bilirubin 07/04/2022 0.4  0.3 - 1.2 mg/dL Final   GFR, Estimated 07/04/2022 >60  >60 mL/min Final   Comment: (NOTE) Calculated using the CKD-EPI Creatinine Equation (2021)    Anion gap 07/04/2022 7  5 - 15 Final   Performed at [facility], [place] [address], [place], [place] [address]   Alcohol, Ethyl (B) 07/04/2022 <10  <10 mg/dL Final   Comment: (NOTE) Lowest detectable limit for serum alcohol is 10 mg/dL.  For medical purposes only. Performed at [facility], [place] [address], [place], [place] [address]    WBC 07/04/2022 7.0  4.0 - 10.5 K/uL Final   RBC 07/04/2022 4.18  3.87 - 5.11 MIL/uL Final   Hemoglobin 07/04/2022 12.8  12.0 - 15.0 g/dL Final   HCT 07/04/2022 39.1  36.0 - 46.0 % Final   MCV 07/04/2022 93.5  80.0 - 100.0 fL Final   MCH 07/04/2022 30.6  26.0 - 34.0 pg Final   MCHC 07/04/2022 32.7  30.0 - 36.0 g/dL Final   RDW 07/04/2022 12.9  11.5 - 15.5 % Final   Platelets 07/04/2022 243  150 - 400 K/uL Final   nRBC 07/04/2022 0.0  0.0 - 0.2 % Final   Neutrophils Relative % 07/04/2022 43  % Final   Neutro Abs 07/04/2022 3.0  1.7 - 7.7 K/uL Final   Lymphocytes Relative 07/04/2022 50  % Final   Lymphs Abs 07/04/2022 3.5  0.7 - 4.0 K/uL Final   Monocytes Relative 07/04/2022 7  % Final   Monocytes Absolute 07/04/2022 0.5   0.1 - 1.0 K/uL Final   Eosinophils Relative 07/04/2022 0  % Final   Eosinophils Absolute 07/04/2022 0.0  0.0 - 0.5 K/uL Final   Basophils Relative 07/04/2022 0  % Final   Basophils Absolute 07/04/2022 0.0  0.0 - 0.1 K/uL Final   Immature Granulocytes 07/04/2022 0  % Final   Abs Immature Granulocytes 07/04/2022  0.01  0.00 - 0.07 K/uL Final   Performed at [facility] [address], [place], [place] [address]   I-stat hCG, quantitative 07/04/2022 <5.0  <5 mIU/mL Final   Comment 3 07/04/2022          Final   Comment:   GEST. AGE      CONC.  (mIU/mL)   <=1 WEEK        5 - 50     2 WEEKS       50 - 500     3 WEEKS       100 - 10,000     4 WEEKS     1,000 - 30,000        FEMALE AND NON-PREGNANT FEMALE:     LESS THAN 5 mIU/mL   Admission on 06/14/2022, Discharged on 06/14/2022  Component Date Value Ref Range Status   Color, UA 06/14/2022 yellow  yellow Final   Clarity, UA 06/14/2022 cloudy (A)  clear Final   Glucose, UA 06/14/2022 negative  negative mg/dL Final   Bilirubin, UA 06/14/2022 negative  negative Final   Ketones, POC UA 06/14/2022 negative  negative mg/dL Final   Spec Grav, UA 06/14/2022 1.025  1.010 - 1.025 Final   Blood, UA 06/14/2022 negative  negative Final   pH, UA 06/14/2022 7.5  5.0 - 8.0 Final   Protein Ur, POC 06/14/2022 =30 (A)  negative mg/dL Final   Urobilinogen, UA 06/14/2022 0.2  0.2 or 1.0 E.U./dL Final   Nitrite, UA 06/14/2022 Negative  Negative Final   Leukocytes, UA 06/14/2022 Negative  Negative Final   Preg Test, Ur 06/14/2022 Negative  Negative Final   Specimen Description 06/14/2022 URINE, CLEAN CATCH   Final   Special Requests 06/14/2022    Final                   Value:NONE Performed at [facility], [place] [address] [place], [place] [address]    Culture 06/14/2022 MULTIPLE SPECIES PRESENT, SUGGEST RECOLLECTION (A)   Final   Report Status 06/14/2022 06/16/2022 FINAL   Final  Admission on 06/07/2022, Discharged on 06/10/2022   Component Date Value Ref Range Status   SARS Coronavirus 2 by RT PCR 06/07/2022 NEGATIVE  NEGATIVE Final   Comment: (NOTE) SARS-CoV-2 target nucleic acids are NOT DETECTED.  The SARS-CoV-2 RNA is generally detectable in upper respiratory specimens during the acute phase of infection. The lowest concentration of SARS-CoV-2 viral copies this assay can detect is 138 copies/mL. A negative result does not preclude SARS-Cov-2 infection and should not be used as the sole basis for treatment or other patient management decisions. A negative result may occur with  improper specimen collection/handling, submission of specimen other than nasopharyngeal swab, presence of viral mutation(s) within the areas targeted by this assay, and inadequate number of viral copies(<138 copies/mL). A negative result must be combined with clinical observations, patient history, and epidemiological information. The expected result is Negative.  Fact Sheet for Patients:  EntrepreneurPulse.com.au  Fact Sheet for Healthcare Providers:  IncredibleEmployment.be  This test is no                          t yet approved or cleared by the [place] FDA and  has been authorized for detection and/or diagnosis of SARS-CoV-2 by FDA under an Emergency Use Authorization (EUA). This EUA will remain  in effect (meaning this test can be used) for the  duration of the COVID-19 declaration under Section 564(b)(1) of the Act, 21 U.S.C.section 360bbb-3(b)(1), unless the authorization is terminated  or revoked sooner.       Influenza A by PCR 06/07/2022 NEGATIVE  NEGATIVE Final   Influenza B by PCR 06/07/2022 NEGATIVE  NEGATIVE Final   Comment: (NOTE) The Xpert Xpress SARS-CoV-2/FLU/RSV plus assay is intended as an aid in the diagnosis of influenza from Nasopharyngeal swab specimens and should not be used as a sole basis for treatment. Nasal washings and aspirates are unacceptable for  Xpert Xpress SARS-CoV-2/FLU/RSV testing.  Fact Sheet for Patients: EntrepreneurPulse.com.au  Fact Sheet for Healthcare Providers: IncredibleEmployment.be  This test is not yet approved or cleared by the [place] FDA and has been authorized for detection and/or diagnosis of SARS-CoV-2 by FDA under an Emergency Use Authorization (EUA). This EUA will remain in effect (meaning this test can be used) for the duration of the COVID-19 declaration under Section 564(b)(1) of the Act, 21 U.S.C. section 360bbb-3(b)(1), unless the authorization is terminated or revoked.  Performed at [facility], [place] [address] [place], [place] [address]    WBC 06/07/2022 5.7  4.0 - 10.5 K/uL Final   RBC 06/07/2022 4.39  3.87 - 5.11 MIL/uL Final   Hemoglobin 06/07/2022 13.2  12.0 - 15.0 g/dL Final   HCT 06/07/2022 40.4  36.0 - 46.0 % Final   MCV 06/07/2022 92.0  80.0 - 100.0 fL Final   MCH 06/07/2022 30.1  26.0 - 34.0 pg Final   MCHC 06/07/2022 32.7  30.0 - 36.0 g/dL Final   RDW 06/07/2022 12.4  11.5 - 15.5 % Final   Platelets 06/07/2022 308  150 - 400 K/uL Final   nRBC 06/07/2022 0.0  0.0 - 0.2 % Final   Neutrophils Relative % 06/07/2022 42  % Final   Neutro Abs 06/07/2022 2.4  1.7 - 7.7 K/uL Final   Lymphocytes Relative 06/07/2022 54  % Final   Lymphs Abs 06/07/2022 3.1  0.7 - 4.0 K/uL Final   Monocytes Relative 06/07/2022 4  % Final   Monocytes Absolute 06/07/2022 0.2  0.1 - 1.0 K/uL Final   Eosinophils Relative 06/07/2022 0  % Final   Eosinophils Absolute 06/07/2022 0.0  0.0 - 0.5 K/uL Final   Basophils Relative 06/07/2022 0  % Final   Basophils Absolute 06/07/2022 0.0  0.0 - 0.1 K/uL Final   Immature Granulocytes 06/07/2022 0  % Final   Abs Immature Granulocytes 06/07/2022 0.01  0.00 - 0.07 K/uL Final   Performed at [facility], [place] [address], [place], [place] [address]   Sodium 06/07/2022 139  135 - 145 mmol/L Final   Potassium 06/07/2022  4.0  3.5 - 5.1 mmol/L Final   Chloride 06/07/2022 104  98 - 111 mmol/L Final   CO2 06/07/2022 28  22 - 32 mmol/L Final   Glucose, Bld 06/07/2022 104 (H)  70 - 99 mg/dL Final   Glucose reference range applies only to samples taken after fasting for at least 8 hours.   BUN 06/07/2022 8  6 - 20 mg/dL Final   Creatinine, Ser 06/07/2022 0.84  0.44 - 1.00 mg/dL Final   Calcium 06/07/2022 9.1  8.9 - 10.3 mg/dL Final   Total Protein 06/07/2022 6.9  6.5 - 8.1 g/dL Final   Albumin 06/07/2022 3.7  3.5 - 5.0 g/dL Final   AST 06/07/2022 19  15 - 41 U/L Final   ALT 06/07/2022 22  0 - 44 U/L Final  Alkaline Phosphatase 06/07/2022 54  38 - 126 U/L Final   Total Bilirubin 06/07/2022 0.4  0.3 - 1.2 mg/dL Final   GFR, Estimated 06/07/2022 >60  >60 mL/min Final   Comment: (NOTE) Calculated using the CKD-EPI Creatinine Equation (2021)    Anion gap 06/07/2022 7  5 - 15 Final   Performed at [place] [address] [place], [place] [address]   Hgb A1c MFr Bld 06/07/2022 5.0  4.8 - 5.6 % Final   Comment: (NOTE) Pre diabetes:          5.7%-6.4%  Diabetes:              >6.4%  Glycemic control for   <7.0% adults with diabetes    Mean Plasma Glucose 06/07/2022 96.8  mg/dL Final   Performed at [facility], [place] [address], [place], [place] [address]   TSH 06/07/2022 1.620  0.350 - 4.500 uIU/mL Final   Comment: Performed by a 3rd Generation assay with a functional sensitivity of <=0.01 uIU/mL. Performed at [facility], [place] [address] [place], [place] [address]    RPR Ser Ql 06/07/2022 NON REACTIVE  NON REACTIVE Final   Performed at [facility], [place] [address], [place], [place] [address]   Color, Urine 06/07/2022 YELLOW  YELLOW Final   APPearance 06/07/2022 HAZY (A)  CLEAR Final   Specific Gravity, Urine 06/07/2022 1.018  1.005 - 1.030 Final   pH 06/07/2022 7.0  5.0 - 8.0 Final   Glucose, UA 06/07/2022 NEGATIVE  NEGATIVE mg/dL Final   Hgb urine dipstick 06/07/2022 NEGATIVE   NEGATIVE Final   Bilirubin Urine 06/07/2022 NEGATIVE  NEGATIVE Final   Ketones, ur 06/07/2022 NEGATIVE  NEGATIVE mg/dL Final   Protein, ur 06/07/2022 NEGATIVE  NEGATIVE mg/dL Final   Nitrite 06/07/2022 NEGATIVE  NEGATIVE Final   Leukocytes,Ua 06/07/2022 NEGATIVE  NEGATIVE Final   Performed at [facility], [place] [address], [place], [place] [address]   Cholesterol 06/07/2022 164  0 - 200 mg/dL Final   Triglycerides 06/07/2022 158 (H)  <150 mg/dL Final   HDL 06/07/2022 44  >40 mg/dL Final   Total CHOL/HDL Ratio 06/07/2022 3.7  RATIO Final   VLDL 06/07/2022 32  0 - 40 mg/dL Final   LDL Cholesterol 06/07/2022 88  0 - 99 mg/dL Final   Comment:        Total Cholesterol/HDL:CHD Risk Coronary Heart Disease Risk Table                     Men   Women  1/2 Average Risk   3.4   3.3  Average Risk       5.0   4.4  2 X Average Risk   9.6   7.1  3 X Average Risk  23.4   11.0        Use the calculated Patient Ratio above and the CHD Risk Table to determine the patient's CHD Risk.        ATP III CLASSIFICATION (LDL):  <100     mg/dL   Optimal  100-129  mg/dL   Near or Above                    Optimal  130-159  mg/dL   Borderline  160-189  mg/dL   High  >190     mg/dL   Very High Performed at [place],  [place] [address]    HIV Screen 4th Generation wRfx 06/07/2022 Non Reactive  Non Reactive Final   Performed at [facility], [place] [address], [place], [place] [address]   SARSCOV2ONAVIRUS 2 AG 06/07/2022 NEGATIVE  NEGATIVE Final   Comment: (NOTE) SARS-CoV-2 antigen NOT DETECTED.   Negative results are presumptive.  Negative results do not preclude SARS-CoV-2 infection and should not be used as the sole basis for treatment or other patient management decisions, including infection  control decisions, particularly in the presence of clinical signs and  symptoms consistent with COVID-19, or in those who have been in contact with the virus.  Negative  results must be combined with clinical observations, patient history, and epidemiological information. The expected result is Negative.  Fact Sheet for Patients: HandmadeRecipes.com.cy  Fact Sheet for Healthcare Providers: FuneralLife.at  This test is not yet approved or cleared by the [place] FDA and  has been authorized for detection and/or diagnosis of SARS-CoV-2 by FDA under an Emergency Use Authorization (EUA).  This EUA will remain in effect (meaning this test can be used) for the duration of  the COV                          ID-19 declaration under Section 564(b)(1) of the Act, 21 U.S.C. section 360bbb-3(b)(1), unless the authorization is terminated or revoked sooner.     POC Amphetamine UR 06/07/2022 None Detected  NONE DETECTED (Cut Off Level 1000 ng/mL) Final   POC Secobarbital (BAR) 06/07/2022 None Detected  NONE DETECTED (Cut Off Level 300 ng/mL) Final   POC Buprenorphine (BUP) 06/07/2022 None Detected  NONE DETECTED (Cut Off Level 10 ng/mL) Final   POC Oxazepam (BZO) 06/07/2022 None Detected  NONE DETECTED (Cut Off Level 300 ng/mL) Final   POC Cocaine UR 06/07/2022 None Detected  NONE DETECTED (Cut Off Level 300 ng/mL) Final   POC Methamphetamine UR 06/07/2022 None Detected  NONE DETECTED (Cut Off Level 1000 ng/mL) Final   POC Morphine 06/07/2022 None Detected  NONE DETECTED (Cut Off Level 300 ng/mL) Final   POC Methadone UR 06/07/2022 None Detected  NONE DETECTED (Cut Off Level 300 ng/mL) Final   POC Oxycodone UR 06/07/2022 None Detected  NONE DETECTED (Cut Off Level 100 ng/mL) Final   POC Marijuana UR 06/07/2022 None Detected  NONE DETECTED (Cut Off Level 50 ng/mL) Final   Preg Test, Ur 06/08/2022 NEGATIVE  NEGATIVE Final   Comment:        THE SENSITIVITY OF THIS METHODOLOGY IS >20 mIU/mL. Performed at [facility], [place] [address], [place], [place] [address]    Preg Test, Ur 06/08/2022 NEGATIVE  NEGATIVE Final    Comment:        THE SENSITIVITY OF THIS METHODOLOGY IS >24 mIU/mL     Blood Alcohol level:  Lab Results  Component Value Date   ETH <10 07/04/2022   ETH <10 [id number]    Metabolic Disorder Labs: Lab Results  Component Value Date   HGBA1C 5.0 06/07/2022   MPG 96.8 06/07/2022   MPG 96.8 02/07/2022   No results found for: "PROLACTIN" Lab Results  Component Value Date   CHOL 181 07/25/2022   TRIG 118 07/25/2022   HDL 45 07/25/2022   CHOLHDL 4.0 07/25/2022   VLDL 24 07/25/2022   LDLCALC 112 (H) 07/25/2022   LDLCALC 88 06/07/2022    Therapeutic Lab Levels: No results found for: "LITHIUM" No results found for: "VALPROATE" No results  found for: "CBMZ"  Physical Findings   GAD-7    Flowsheet Row Office Visit from 12/17/2021 in [place]  Total GAD-7 Score 17      PHQ2-9    [facility] from 06/07/2022 in [facility] Office Visit from 12/17/2021 in [place]  PHQ-2 Total Score 1 2  PHQ-9 Total Score 2 8      Flowsheet [place] ED from 07/25/2022 in [facility] ED from 07/04/2022 in [place] DEPT ED from 06/14/2022 in [facility] [place] No Risk No Risk No Risk        Musculoskeletal  Strength & Muscle Tone: within normal limits Gait & Station: normal Patient leans: N/A  Psychiatric Specialty Exam  Presentation  General Appearance:  Appropriate for Environment; Casual  Eye Contact: Good  Speech: Clear and Coherent; Normal Rate  Speech Volume: Normal  Handedness: Right   Mood and Affect  Mood: Euthymic  Affect: Appropriate; Congruent   Thought Process  Thought Processes: Coherent; Goal Directed; Linear  Descriptions of Associations:Intact  Orientation:Full (Time, Place and Person)  Thought Content:Logical  Diagnosis of Schizophrenia or  Schizoaffective disorder in past: Yes    Hallucinations:Hallucinations: None  Ideas of Reference:None  Suicidal Thoughts:Suicidal Thoughts: No  Homicidal Thoughts:Homicidal Thoughts: No   Sensorium  Memory: Immediate Good  Judgment: Intact  Insight: Present   Executive Functions  Concentration: Good  Attention Span: Good  Recall: Good  Fund of Knowledge: Fair  Language: Fair   Psychomotor Activity  Psychomotor Activity: Psychomotor Activity: Normal   Assets  Assets: Communication Skills; Desire for Improvement; Physical Health   Sleep  Sleep: Sleep: Good   Nutritional Assessment (For OBS and FBC admissions only) Has the patient had a weight loss or gain of 10 pounds or more in the last 3 months?: No Has the patient had a decrease in food intake/or appetite?: No Does the patient have dental problems?: No Does the patient have eating habits or behaviors that may be indicators of an eating disorder including binging or inducing vomiting?: No Has the patient recently lost weight without trying?: 0 Has the patient been eating poorly because of a decreased appetite?: 0 Malnutrition Screening Tool Score: 0    Physical Exam  Physical Exam Vitals and nursing note reviewed.  Constitutional:      Appearance: Normal appearance. She is well-developed.  HENT:     Head: Normocephalic and atraumatic.     Nose: Nose normal.  Cardiovascular:     Rate and Rhythm: Normal rate.  Pulmonary:     Effort: Pulmonary effort is normal.  Musculoskeletal:        General: Normal range of motion.     Cervical back: Normal range of motion.  Skin:    General: Skin is warm and dry.  Neurological:     Mental Status: She is alert and oriented to person, place, and time.  Psychiatric:        Attention and Perception: Attention and perception normal.        Mood and Affect: Mood and affect normal.        Speech: Speech normal.        Behavior: Behavior normal.  Behavior is cooperative.        Thought Content: Thought content normal.        Cognition and Memory: Cognition and memory normal.  Review of Systems  Constitutional: Negative.   HENT: Negative.    Eyes: Negative.   Respiratory: Negative.    Cardiovascular: Negative.   Gastrointestinal: Negative.   Genitourinary: Negative.   Musculoskeletal: Negative.   Skin: Negative.   Neurological: Negative.   Psychiatric/Behavioral: Negative.     Blood pressure 108/72, pulse (!) 101, temperature 98.2 F (36.8 C), temperature source Oral, resp. rate 19, SpO2 98 %. There is no height or weight on file to calculate BMI.  Treatment Plan Summary: Patient reviewed with Dr Melba Coon. She remains cleared by psychiatry. TOC team actively seeking disposition for this patient.  Daily contact with patient to assess and evaluate symptoms and progress in treatment  Lucky Rathke, FNP 09/25/2022 11:08 AM

## 2022-09-25 NOTE — ED Notes (Signed)
Patient on telephone, calm and cooperative. No acute distress noted.

## 2022-09-25 NOTE — Progress Notes (Signed)
Nicole [name] was upset and wanted the telephone number to [facility] records. She was pacing and extremely upset. Later the social worker was able to spend time with her and explain the process. Nurse Lockie Pares escorted and sat with her to assist with note taking.

## 2022-09-25 NOTE — ED Notes (Signed)
[name]/O x4. Observed in milieu socializing with peers. Denies SI/HI/AVH and pain at the time of assessment. Med compliant with all scheduled medications. Patient ate breakfast and completed personal hygiene this morning. Will continue to monitor for safety throughout shift.

## 2022-09-26 NOTE — Progress Notes (Signed)
[facility] completed a 20 minute facetime with the client's daughter.  Facetime went well.  After the call the client wanted to know if she could order holly a pair off bubble slide shoes for her birthday.  [facility] will ask if she has permission to do so.    [facility] [place]

## 2022-09-26 NOTE — ED Provider Notes (Signed)
Behavioral Health Progress Note  Date and Time: 09/26/2022 10:27 AM Name: Nicole [name] MRN:  [id number]   Per HPI: Nicole [name] is a 31 year old female with a past psychiatric history significant for schizophrenia and major depressive disorder with psychosis who voluntarily presented to [facility] on 07/25/2022 via [facility] after a verbal altercation with [name] (not patient's legal guardian) for transient SI. This is patient's fourth visit to [facility] for similar concerns this year. Patient has been dismissed from previous group home due to threatening suicidal and homicidal ideations, then leaving the group home. Legal Guardian: [name].  Subjective: Patient seen and evaluated face-to-face by this provider, and chart reviewed. On evaluation, patient is alert and oriented x 4. She appears to be happy at this time and stated that she feels like dancing. Her thought process is logical and speech is clear and coherent at a moderate tone. Her mood is euthymic and affect is congruent. She denies depressive symptoms. She reports fair sleep. She reports a good appetite. She is compliant with taking scheduled medications and denies medication side effects at this time. She denies physical complaints including no shortness of breath, chest pains, involuntary movements or muscle spasms. She asked this provider if I am familiar with the behavioral health in [place] because she is trying to get her medical records. She states that she is working with the Child psychotherapist as a team to get her medical records.  She states that she has placement with Ms. [name] at [facility]. She states that her father refuses to come visit her because he lives in [place]. She denies further concerns at this time.   Diagnosis:  Final diagnoses:  Severe episode of recurrent major depressive disorder, with psychotic features (HCC)     Total Time spent with patient: 15 minutes  Past Psychiatric History: History of schizoaffective, borderline IDD, anxiety, and MDD.     Past Medical History:  Past Medical History:  Diagnosis Date   Anxiety    Depression    Hypothyroidism 08/07/2022   Tobacco use disorder 08/07/2022    Past Surgical History:  Procedure Laterality Date   WISDOM TOOTH EXTRACTION Bilateral 2020   Family History:  Family History  Problem Relation Age of Onset   Hypertension Father    Diabetes Father    Family Psychiatric  History: No history reported.  Social History:  Social History   Substance and Sexual Activity  Alcohol Use Never     Social History   Substance and Sexual Activity  Drug Use Never    Social History   Socioeconomic History   Marital status: Single    Spouse name: Not on file   Number of children: Not on file   Years of education: Not on file   Highest education level: Not on file  Occupational History   Not on file  Tobacco Use   Smoking status: Every Day    Types: Cigarettes   Smokeless tobacco: Never  Vaping Use   Vaping Use: Some days  Substance and Sexual Activity   Alcohol use: Never   Drug use: Never   Sexual activity: Yes    Partners: Female    Birth control/protection: Implant  Other Topics Concern   Not on file  Social History Narrative   Not on file   Social Determinants of Health   Financial Resource Strain: Not on file  Food Insecurity: Not on file  Transportation Needs: Not on file  Physical Activity: Not on file  Stress: Not on file  Social Connections: Not on file   SDOH:  SDOH Screenings   Depression (PHQ2-9): Low Risk  (06/10/2022)  Tobacco Use: High Risk (09/24/2022)   Additional Social History:    Pain Medications: SEE MAR Prescriptions: SEE MAR Over the Counter: SEE MAR History of alcohol / drug use?: Yes Longest period of sobriety (when/how long): N/A Negative Consequences of Use:  (NONE) Withdrawal Symptoms:  None Name of Substance 1: ETOH IN PAST--PT CURRENTLY DENIES 1 - Frequency: SOCIAL USE ETOH IN PAST 1 - Method of Aquiring: LEGAL 1- Route of Use: ORAL DRINK Name of Substance 2: NICOTINE-VAPE AND CIGARETTES 2 - Amount (size/oz): LESS THAN ONE PACK 2 - Frequency: DAILY 2 - Method of Aquiring: LEGAL 2 - Route of Substance Use: ORAL SMOKE    Current Medications:  Current Facility-Administered Medications  Medication Dose Route Frequency Provider Last Rate Last Admin   acetaminophen (TYLENOL) tablet 650 mg  650 mg Oral Q6H PRN Onuoha, Chinwendu V, NP   650 mg at 09/21/22 2200   ARIPiprazole ER (ABILIFY MAINTENA) injection 400 mg  400 mg Intramuscular Q28 days Princess Bruins, DO   400 mg at 08/30/22 1338   docusate sodium (COLACE) capsule 100 mg  100 mg Oral Daily [name], MD   100 mg at 09/26/22 0917   hydrOXYzine (ATARAX) tablet 25 mg  25 mg Oral TID PRN Onuoha, Chinwendu V, NP   25 mg at 09/24/22 2119   ibuprofen (ADVIL) tablet 400 mg  400 mg Oral Q6H PRN Onuoha, Chinwendu V, NP   400 mg at 09/20/22 2115   levothyroxine (SYNTHROID) tablet 100 mcg  100 mcg Oral Q0600 Princess Bruins, DO   100 mcg at 09/26/22 0618   loratadine (CLARITIN) tablet 10 mg  10 mg Oral Daily Rankin, Shuvon B, NP   10 mg at 09/26/22 0918   metFORMIN (GLUCOPHAGE) tablet 500 mg  500 mg Oral Q breakfast Park Pope, MD   500 mg at 09/26/22 0846   nicotine (NICODERM CQ - dosed in mg/24 hours) patch 14 mg  14 mg Transdermal Q0600 Onuoha, Chinwendu V, NP   14 mg at 09/26/22 0847   ondansetron (ZOFRAN-ODT) disintegrating tablet 4 mg  4 mg Oral Q8H PRN Sindy Guadeloupe, NP   4 mg at 09/14/22 2351   Oxcarbazepine (TRILEPTAL) tablet 300 mg  300 mg Oral BID Onuoha, Chinwendu V, NP   300 mg at 09/26/22 0918   pantoprazole (PROTONIX) EC tablet 20 mg  20 mg Oral Daily Princess Bruins, DO   20 mg at 09/26/22 0918   QUEtiapine (SEROQUEL) tablet 400 mg  400 mg Oral BID Onuoha, Chinwendu V, NP   400 mg at 09/26/22 0917   sertraline  (ZOLOFT) tablet 150 mg  150 mg Oral Daily Onuoha, Chinwendu V, NP   150 mg at 09/26/22 0917   simethicone (MYLICON) chewable tablet 80 mg  80 mg Oral Q6H PRN Sindy Guadeloupe, NP   80 mg at 09/25/22 2147   traZODone (DESYREL) tablet 100 mg  100 mg Oral QHS Ardis Hughs, NP   100 mg at 09/25/22 2137   valACYclovir (VALTREX) tablet 500 mg  500 mg Oral Daily Princess Bruins, DO   500 mg at 09/26/22 0918   ziprasidone (GEODON) injection 20 mg  20 mg Intramuscular Q12H PRN Onuoha, Chinwendu V, NP   20 mg at 09/19/22 2133   Current Outpatient Medications  Medication Sig Dispense Refill   ABILIFY MAINTENA 400 MG PRSY prefilled syringe 400 mg every 28 (twenty-eight) days.     cetirizine (ZYRTEC) 10 MG tablet Take 10 mg by mouth daily.     cyclobenzaprine (FLEXERIL) 10 MG tablet Take 1 tablet (10 mg total) by mouth 2 (two) times daily as needed for muscle spasms. 20 tablet 0   fluticasone (FLONASE) 50 MCG/ACT nasal spray Place 1 spray into both nostrils daily.     meloxicam (MOBIC) 15 MG tablet Take 15 mg by mouth daily.     Oxcarbazepine (TRILEPTAL) 300 MG tablet Take 1 tablet (300 mg total) by mouth 2 (two) times daily. 60 tablet 0   QUEtiapine (SEROQUEL) 400 MG tablet Take 1 tablet (400 mg total) by mouth 2 (two) times daily. 60 tablet 0   sertraline (ZOLOFT) 50 MG tablet Take 3 tablets (150 mg total) by mouth in the morning. 90 tablet 0   traZODone (DESYREL) 100 MG tablet Take 1 tablet (100 mg total) by mouth at bedtime. 30 tablet 0   valACYclovir (VALTREX) 500 MG tablet Take 500 mg by mouth daily.      Labs  Lab Results:  Admission on 07/25/2022  Component Date Value Ref Range Status   SARS Coronavirus 2 by RT PCR 07/25/2022 NEGATIVE  NEGATIVE Final   Comment: (NOTE) SARS-CoV-2 target nucleic acids are NOT DETECTED.  The SARS-CoV-2 RNA is generally detectable in upper respiratory specimens during the acute phase of infection. The lowest concentration of SARS-CoV-2 viral copies this assay  can detect is 138 copies/mL. A negative result does not preclude SARS-Cov-2 infection and should not be used as the sole basis for treatment or other patient management decisions. A negative result may occur with  improper specimen collection/handling, submission of specimen other than nasopharyngeal swab, presence of viral mutation(s) within the areas targeted by this assay, and inadequate number of viral copies(<138 copies/mL). A negative result must be combined with clinical observations, patient history, and epidemiological information. The expected result is Negative.  Fact Sheet for Patients:  BloggerCourse.com  Fact Sheet for Healthcare Providers:  SeriousBroker.it  This test is no                          t yet approved or cleared by the [place] FDA and  has been authorized for detection and/or diagnosis of SARS-CoV-2 by FDA under an Emergency Use Authorization (EUA). This EUA will remain  in effect (meaning this test can be used) for the duration of the COVID-19 declaration under Section 564(b)(1) of the Act, 21 U.S.C.section 360bbb-3(b)(1), unless the authorization is terminated  or revoked sooner.       Influenza A by PCR 07/25/2022 NEGATIVE  NEGATIVE Final   Influenza B by PCR 07/25/2022 NEGATIVE  NEGATIVE Final   Comment: (NOTE) The Xpert Xpress SARS-CoV-2/FLU/RSV plus assay is intended as an aid in the diagnosis of influenza from Nasopharyngeal swab specimens and should not be used as a sole basis for treatment. Nasal washings and aspirates are unacceptable for Xpert Xpress SARS-CoV-2/FLU/RSV testing.  Fact Sheet for Patients: BloggerCourse.com  Fact Sheet for Healthcare Providers: SeriousBroker.it  This test is not yet approved or cleared by the [place] FDA and has been authorized for detection and/or diagnosis of SARS-CoV-2 by FDA under an  Emergency Use Authorization (EUA). This EUA will remain in effect (meaning this test can be used) for the duration of the COVID-19 declaration under Section 564(b)(1) of  the Act, 21 U.S.C. section 360bbb-3(b)(1), unless the authorization is terminated or revoked.  Performed at [facility], [address], [place], [place] [address]    WBC 07/25/2022 8.3  4.0 - 10.5 K/uL Final   RBC 07/25/2022 4.44  3.87 - 5.11 MIL/uL Final   Hemoglobin 07/25/2022 13.7  12.0 - 15.0 g/dL Final   HCT 60/45/4098 40.2  36.0 - 46.0 % Final   MCV 07/25/2022 90.5  80.0 - 100.0 fL Final   MCH 07/25/2022 30.9  26.0 - 34.0 pg Final   MCHC 07/25/2022 34.1  30.0 - 36.0 g/dL Final   RDW 11/91/4782 12.2  11.5 - 15.5 % Final   Platelets 07/25/2022 248  150 - 400 K/uL Final   nRBC 07/25/2022 0.0  0.0 - 0.2 % Final   Neutrophils Relative % 07/25/2022 43  % Final   Neutro Abs 07/25/2022 3.6  1.7 - 7.7 K/uL Final   Lymphocytes Relative 07/25/2022 52  % Final   Lymphs Abs 07/25/2022 4.2 (H)  0.7 - 4.0 K/uL Final   Monocytes Relative 07/25/2022 5  % Final   Monocytes Absolute 07/25/2022 0.4  0.1 - 1.0 K/uL Final   Eosinophils Relative 07/25/2022 0  % Final   Eosinophils Absolute 07/25/2022 0.0  0.0 - 0.5 K/uL Final   Basophils Relative 07/25/2022 0  % Final   Basophils Absolute 07/25/2022 0.0  0.0 - 0.1 K/uL Final   Immature Granulocytes 07/25/2022 0  % Final   Abs Immature Granulocytes 07/25/2022 0.02  0.00 - 0.07 K/uL Final   Performed at [facility], [address], [place], [place] [address]   Sodium 07/25/2022 138  135 - 145 mmol/L Final   Potassium 07/25/2022 4.0  3.5 - 5.1 mmol/L Final   Chloride 07/25/2022 104  98 - 111 mmol/L Final   CO2 07/25/2022 29  22 - 32 mmol/L Final   Glucose, Bld 07/25/2022 83  70 - 99 mg/dL Final   Glucose reference range applies only to samples taken after fasting for at least 8 hours.   BUN 07/25/2022 11  6 - 20 mg/dL Final   Creatinine, Ser 07/25/2022 0.97  0.44 -  1.00 mg/dL Final   Calcium 30/86/5784 9.2  8.9 - 10.3 mg/dL Final   Total Protein 69/62/9528 7.0  6.5 - 8.1 g/dL Final   Albumin 41/32/4401 3.8  3.5 - 5.0 g/dL Final   AST 02/72/5366 18  15 - 41 U/L Final   ALT 07/25/2022 22  0 - 44 U/L Final   Alkaline Phosphatase 07/25/2022 64  38 - 126 U/L Final   Total Bilirubin 07/25/2022 0.2 (L)  0.3 - 1.2 mg/dL Final   GFR, Estimated 07/25/2022 >60  >60 mL/min Final   Comment: (NOTE) Calculated using the CKD-EPI Creatinine Equation (2021)    Anion gap 07/25/2022 5  5 - 15 Final   Performed at [facility], [address] [place], [place] [address]   POC Amphetamine UR 07/25/2022 None Detected  NONE DETECTED (Cut Off Level 1000 ng/mL) Preliminary   POC Secobarbital (BAR) 07/25/2022 None Detected  NONE DETECTED (Cut Off Level 300 ng/mL) Preliminary   POC Buprenorphine (BUP) 07/25/2022 None Detected  NONE DETECTED (Cut Off Level 10 ng/mL) Preliminary   POC Oxazepam (BZO) 07/25/2022 None Detected  NONE DETECTED (Cut Off Level 300 ng/mL) Preliminary   POC Cocaine UR 07/25/2022 None Detected  NONE DETECTED (Cut Off Level 300 ng/mL) Preliminary   POC Methamphetamine UR 07/25/2022 None  Detected  NONE DETECTED (Cut Off Level 1000 ng/mL) Preliminary   POC Morphine 07/25/2022 None Detected  NONE DETECTED (Cut Off Level 300 ng/mL) Preliminary   POC Methadone UR 07/25/2022 None Detected  NONE DETECTED (Cut Off Level 300 ng/mL) Preliminary   POC Oxycodone UR 07/25/2022 None Detected  NONE DETECTED (Cut Off Level 100 ng/mL) Preliminary   POC Marijuana UR 07/25/2022 None Detected  NONE DETECTED (Cut Off Level 50 ng/mL) Preliminary   SARSCOV2ONAVIRUS 2 AG 07/25/2022 NEGATIVE  NEGATIVE Final   Comment: (NOTE) SARS-CoV-2 antigen NOT DETECTED.   Negative results are presumptive.  Negative results do not preclude SARS-CoV-2 infection and should not be used as the sole basis for treatment or other patient management decisions, including infection  control  decisions, particularly in the presence of clinical signs and  symptoms consistent with COVID-19, or in those who have been in contact with the virus.  Negative results must be combined with clinical observations, patient history, and epidemiological information. The expected result is Negative.  Fact Sheet for Patients: https://www.jennings-kim.com/  Fact Sheet for Healthcare Providers: https://alexander-rogers.biz/  This test is not yet approved or cleared by the [place] FDA and  has been authorized for detection and/or diagnosis of SARS-CoV-2 by FDA under an Emergency Use Authorization (EUA).  This EUA will remain in effect (meaning this test can be used) for the duration of  the COV                          ID-19 declaration under Section 564(b)(1) of the Act, 21 U.S.C. section 360bbb-3(b)(1), unless the authorization is terminated or revoked sooner.     Cholesterol 07/25/2022 181  0 - 200 mg/dL Final   Triglycerides 37/62/8315 118  <150 mg/dL Final   HDL 17/61/6073 45  >40 mg/dL Final   Total CHOL/HDL Ratio 07/25/2022 4.0  RATIO Final   VLDL 07/25/2022 24  0 - 40 mg/dL Final   LDL Cholesterol 07/25/2022 112 (H)  0 - 99 mg/dL Final   Comment:        Total Cholesterol/HDL:CHD Risk Coronary Heart Disease Risk Table                     Men   Women  1/2 Average Risk   3.4   3.3  Average Risk       5.0   4.4  2 X Average Risk   9.6   7.1  3 X Average Risk  23.4   11.0        Use the calculated Patient Ratio above and the CHD Risk Table to determine the patient's CHD Risk.        ATP III CLASSIFICATION (LDL):  <100     mg/dL   Optimal  710-626  mg/dL   Near or Above                    Optimal  130-159  mg/dL   Borderline  948-546  mg/dL   High  >270     mg/dL   Very High Performed at [facility], [address], [place], [place] [address]    TSH 07/25/2022 6.668 (H)  0.350 - 4.500 uIU/mL Final   Comment: Performed by a 3rd  Generation assay with a functional sensitivity of <=0.01 uIU/mL. Performed at [facility], [address], [place], [place] [address]    Glucose-Capillary 07/26/2022 104 (H)  70 -  99 mg/dL Final   Glucose reference range applies only to samples taken after fasting for at least 8 hours.   T3, Free 07/31/2022 2.3  2.0 - 4.4 pg/mL Final   Comment: (NOTE) Performed At: [facility] [address] [place], [place] [id number] Jolene Schimke MD OZ:[id number]    Free T4 07/31/2022 0.60 (L)  0.61 - 1.12 ng/dL Final   Comment: (NOTE) Biotin ingestion may interfere with free T4 tests. If the results are inconsistent with the TSH level, previous test results, or the clinical presentation, then consider biotin interference. If needed, order repeat testing after stopping biotin. Performed at [facility], [address], [place], [place] [address]    Glucose-Capillary 08/29/2022 100 (H)  70 - 99 mg/dL Final   Glucose reference range applies only to samples taken after fasting for at least 8 hours.   TSH 09/05/2022 0.793  0.350 - 4.500 uIU/mL Final   Comment: Performed by a 3rd Generation assay with a functional sensitivity of <=0.01 uIU/mL. Performed at [facility], [address], [place], [place] [address]    Free T4 09/05/2022 0.73  0.61 - 1.12 ng/dL Final   Comment: (NOTE) Biotin ingestion may interfere with free T4 tests. If the results are inconsistent with the TSH level, previous test results, or the clinical presentation, then consider biotin interference. If needed, order repeat testing after stopping biotin. Performed at [facility], [address] [place], [place] [address]    Preg Test, Ur 09/06/2022 Negative  Negative Final   Preg Test, Ur 09/06/2022 NEGATIVE  NEGATIVE Final   Comment:        THE SENSITIVITY OF THIS METHODOLOGY IS >24 mIU/mL    Preg Test, Ur 09/05/2022 NEGATIVE  NEGATIVE Final   Comment:        THE SENSITIVITY OF  THIS METHODOLOGY IS >24 mIU/mL    Sodium 09/13/2022 136  135 - 145 mmol/L Final   Potassium 09/13/2022 4.5  3.5 - 5.1 mmol/L Final   Chloride 09/13/2022 105  98 - 111 mmol/L Final   CO2 09/13/2022 21 (L)  22 - 32 mmol/L Final   Glucose, Bld 09/13/2022 111 (H)  70 - 99 mg/dL Final   Glucose reference range applies only to samples taken after fasting for at least 8 hours.   BUN 09/13/2022 14  6 - 20 mg/dL Final   Creatinine, Ser 09/13/2022 0.92  0.44 - 1.00 mg/dL Final   Calcium 51/88/4166 9.1  8.9 - 10.3 mg/dL Final   GFR, Estimated 09/13/2022 >60  >60 mL/min Final   Comment: (NOTE) Calculated using the CKD-EPI Creatinine Equation (2021)    Anion gap 09/13/2022 10  5 - 15 Final   Performed at [facility], [address], [place], [place] [address]   Vitamin B-12 09/13/2022 585  180 - 914 pg/mL Final   Comment: (NOTE) This assay is not validated for testing neonatal or myeloproliferative syndrome specimens for Vitamin B12 levels. Performed at [facility], [address] [place], [place] [address]    Color, Urine 09/14/2022 YELLOW  YELLOW Final   APPearance 09/14/2022 HAZY (A)  CLEAR Final   Specific Gravity, Urine 09/14/2022 1.025  1.005 - 1.030 Final   pH 09/14/2022 5.0  5.0 - 8.0 Final   Glucose, UA 09/14/2022 NEGATIVE  NEGATIVE mg/dL Final   Hgb urine dipstick 09/14/2022 NEGATIVE  NEGATIVE Final   Bilirubin Urine 09/14/2022 NEGATIVE  NEGATIVE Final   Ketones, ur  09/14/2022 NEGATIVE  NEGATIVE mg/dL Final   Protein, ur 04/54/098112/11/2021 NEGATIVE  NEGATIVE mg/dL Final   Nitrite 19/14/782912/11/2021 NEGATIVE  NEGATIVE Final   Leukocytes,Ua 09/14/2022 TRACE (A)  NEGATIVE Final   RBC / HPF 09/14/2022 0-5  0 - 5 RBC/hpf Final   WBC, UA 09/14/2022 0-5  0 - 5 WBC/hpf Final   Bacteria, UA 09/14/2022 RARE (A)  NONE SEEN Final   Squamous Epithelial / LPF 09/14/2022 0-5  0 - 5 Final   Mucus 09/14/2022 PRESENT   Final   Performed at [facility], [address] [place], [place]  [id number]   Glucose-Capillary 09/16/2022 105 (H)  70 - 99 mg/dL Final   Glucose reference range applies only to samples taken after fasting for at least 8 hours.  Admission on 07/04/2022, Discharged on 07/05/2022  Component Date Value Ref Range Status   Sodium 07/04/2022 142  135 - 145 mmol/L Final   Potassium 07/04/2022 4.4  3.5 - 5.1 mmol/L Final   Chloride 07/04/2022 109  98 - 111 mmol/L Final   CO2 07/04/2022 26  22 - 32 mmol/L Final   Glucose, Bld 07/04/2022 96  70 - 99 mg/dL Final   Glucose reference range applies only to samples taken after fasting for at least 8 hours.   BUN 07/04/2022 15  6 - 20 mg/dL Final   Creatinine, Ser 07/04/2022 0.83  0.44 - 1.00 mg/dL Final   Calcium 08/65/784609/21/2023 9.3  8.9 - 10.3 mg/dL Final   Total Protein 96/29/528409/21/2023 7.2  6.5 - 8.1 g/dL Final   Albumin 13/24/401009/21/2023 3.9  3.5 - 5.0 g/dL Final   AST 27/25/366409/21/2023 23  15 - 41 U/L Final   ALT 07/04/2022 28  0 - 44 U/L Final   Alkaline Phosphatase 07/04/2022 77  38 - 126 U/L Final   Total Bilirubin 07/04/2022 0.4  0.3 - 1.2 mg/dL Final   GFR, Estimated 07/04/2022 >60  >60 mL/min Final   Comment: (NOTE) Calculated using the CKD-EPI Creatinine Equation (2021)    Anion gap 07/04/2022 7  5 - 15 Final   Performed at [facility], [address] [place], [place] [address]   Alcohol, Ethyl (B) 07/04/2022 <10  <10 mg/dL Final   Comment: (NOTE) Lowest detectable limit for serum alcohol is 10 mg/dL.  For medical purposes only. Performed at [facility], [address] [place], [place] [address]    WBC 07/04/2022 7.0  4.0 - 10.5 K/uL Final   RBC 07/04/2022 4.18  3.87 - 5.11 MIL/uL Final   Hemoglobin 07/04/2022 12.8  12.0 - 15.0 g/dL Final   HCT 63/87/564309/21/2023 39.1  36.0 - 46.0 % Final   MCV 07/04/2022 93.5  80.0 - 100.0 fL Final   MCH 07/04/2022 30.6  26.0 - 34.0 pg Final   MCHC 07/04/2022 32.7  30.0 - 36.0 g/dL Final   RDW 32/95/188409/21/2023 12.9  11.5 - 15.5 % Final   Platelets 07/04/2022  243  150 - 400 K/uL Final   nRBC 07/04/2022 0.0  0.0 - 0.2 % Final   Neutrophils Relative % 07/04/2022 43  % Final   Neutro Abs 07/04/2022 3.0  1.7 - 7.7 K/uL Final   Lymphocytes Relative 07/04/2022 50  % Final   Lymphs Abs 07/04/2022 3.5  0.7 - 4.0 K/uL Final   Monocytes Relative 07/04/2022 7  % Final   Monocytes Absolute 07/04/2022 0.5  0.1 - 1.0 K/uL Final   Eosinophils Relative 07/04/2022 0  % Final  Eosinophils Absolute 07/04/2022 0.0  0.0 - 0.5 K/uL Final   Basophils Relative 07/04/2022 0  % Final   Basophils Absolute 07/04/2022 0.0  0.0 - 0.1 K/uL Final   Immature Granulocytes 07/04/2022 0  % Final   Abs Immature Granulocytes 07/04/2022 0.01  0.00 - 0.07 K/uL Final   Performed at [facility], [address] [place], [place] [address]   I-stat hCG, quantitative 07/04/2022 <5.0  <5 mIU/mL Final   Comment 3 07/04/2022          Final   Comment:   GEST. AGE      CONC.  (mIU/mL)   <=1 WEEK        5 - 50     2 WEEKS       50 - 500     3 WEEKS       100 - 10,000     4 WEEKS     1,000 - 30,000        FEMALE AND NON-PREGNANT FEMALE:     LESS THAN 5 mIU/mL   Admission on 06/14/2022, Discharged on 06/14/2022  Component Date Value Ref Range Status   Color, UA 06/14/2022 yellow  yellow Final   Clarity, UA 06/14/2022 cloudy (A)  clear Final   Glucose, UA 06/14/2022 negative  negative mg/dL Final   Bilirubin, UA 62/13/0865 negative  negative Final   Ketones, POC UA 06/14/2022 negative  negative mg/dL Final   Spec Grav, UA 78/46/9629 1.025  1.010 - 1.025 Final   Blood, UA 06/14/2022 negative  negative Final   pH, UA 06/14/2022 7.5  5.0 - 8.0 Final   Protein Ur, POC 06/14/2022 =30 (A)  negative mg/dL Final   Urobilinogen, UA 06/14/2022 0.2  0.2 or 1.0 E.U./dL Final   Nitrite, UA 52/84/1324 Negative  Negative Final   Leukocytes, UA 06/14/2022 Negative  Negative Final   Preg Test, Ur 06/14/2022 Negative  Negative Final   Specimen Description 06/14/2022 URINE, CLEAN  CATCH   Final   Special Requests 06/14/2022    Final                   Value:NONE Performed at [facility], [address], [place], [place] [address]    Culture 06/14/2022 MULTIPLE SPECIES PRESENT, SUGGEST RECOLLECTION (A)   Final   Report Status 06/14/2022 06/16/2022 FINAL   Final  Admission on 06/07/2022, Discharged on 06/10/2022  Component Date Value Ref Range Status   SARS Coronavirus 2 by RT PCR 06/07/2022 NEGATIVE  NEGATIVE Final   Comment: (NOTE) SARS-CoV-2 target nucleic acids are NOT DETECTED.  The SARS-CoV-2 RNA is generally detectable in upper respiratory specimens during the acute phase of infection. The lowest concentration of SARS-CoV-2 viral copies this assay can detect is 138 copies/mL. A negative result does not preclude SARS-Cov-2 infection and should not be used as the sole basis for treatment or other patient management decisions. A negative result may occur with  improper specimen collection/handling, submission of specimen other than nasopharyngeal swab, presence of viral mutation(s) within the areas targeted by this assay, and inadequate number of viral copies(<138 copies/mL). A negative result must be combined with clinical observations, patient history, and epidemiological information. The expected result is Negative.  Fact Sheet for Patients:  BloggerCourse.com  Fact Sheet for Healthcare Providers:  SeriousBroker.it  This test is no  t yet approved or cleared by the [place] and  has been authorized for detection and/or diagnosis of SARS-CoV-2 by FDA under an Emergency Use Authorization (EUA). This EUA will remain  in effect (meaning this test can be used) for the duration of the COVID-19 declaration under Section 564(b)(1) of the Act, 21 U.S.C.section 360bbb-3(b)(1), unless the authorization is terminated  or revoked sooner.       Influenza A by PCR  06/07/2022 NEGATIVE  NEGATIVE Final   Influenza B by PCR 06/07/2022 NEGATIVE  NEGATIVE Final   Comment: (NOTE) The Xpert Xpress SARS-CoV-2/FLU/RSV plus assay is intended as an aid in the diagnosis of influenza from Nasopharyngeal swab specimens and should not be used as a sole basis for treatment. Nasal washings and aspirates are unacceptable for Xpert Xpress SARS-CoV-2/FLU/RSV testing.  Fact Sheet for Patients: BloggerCourse.com  Fact Sheet for Healthcare Providers: SeriousBroker.it  This test is not yet approved or cleared by the [place] FDA and has been authorized for detection and/or diagnosis of SARS-CoV-2 by FDA under an Emergency Use Authorization (EUA). This EUA will remain in effect (meaning this test can be used) for the duration of the COVID-19 declaration under Section 564(b)(1) of the Act, 21 U.S.C. section 360bbb-3(b)(1), unless the authorization is terminated or revoked.  Performed at [facility], [address] [place], [place] [address]    WBC 06/07/2022 5.7  4.0 - 10.5 K/uL Final   RBC 06/07/2022 4.39  3.87 - 5.11 MIL/uL Final   Hemoglobin 06/07/2022 13.2  12.0 - 15.0 g/dL Final   HCT 21/30/8657 40.4  36.0 - 46.0 % Final   MCV 06/07/2022 92.0  80.0 - 100.0 fL Final   MCH 06/07/2022 30.1  26.0 - 34.0 pg Final   MCHC 06/07/2022 32.7  30.0 - 36.0 g/dL Final   RDW 84/69/6295 12.4  11.5 - 15.5 % Final   Platelets 06/07/2022 308  150 - 400 K/uL Final   nRBC 06/07/2022 0.0  0.0 - 0.2 % Final   Neutrophils Relative % 06/07/2022 42  % Final   Neutro Abs 06/07/2022 2.4  1.7 - 7.7 K/uL Final   Lymphocytes Relative 06/07/2022 54  % Final   Lymphs Abs 06/07/2022 3.1  0.7 - 4.0 K/uL Final   Monocytes Relative 06/07/2022 4  % Final   Monocytes Absolute 06/07/2022 0.2  0.1 - 1.0 K/uL Final   Eosinophils Relative 06/07/2022 0  % Final   Eosinophils Absolute 06/07/2022 0.0  0.0 - 0.5 K/uL Final   Basophils  Relative 06/07/2022 0  % Final   Basophils Absolute 06/07/2022 0.0  0.0 - 0.1 K/uL Final   Immature Granulocytes 06/07/2022 0  % Final   Abs Immature Granulocytes 06/07/2022 0.01  0.00 - 0.07 K/uL Final   Performed at [facility], [address], [place], [place] [address]   Sodium 06/07/2022 139  135 - 145 mmol/L Final   Potassium 06/07/2022 4.0  3.5 - 5.1 mmol/L Final   Chloride 06/07/2022 104  98 - 111 mmol/L Final   CO2 06/07/2022 28  22 - 32 mmol/L Final   Glucose, Bld 06/07/2022 104 (H)  70 - 99 mg/dL Final   Glucose reference range applies only to samples taken after fasting for at least 8 hours.   BUN 06/07/2022 8  6 - 20 mg/dL Final   Creatinine, Ser 06/07/2022 0.84  0.44 - 1.00 mg/dL Final   Calcium 24/40/1027 9.1  8.9 - 10.3 mg/dL Final  Total Protein 06/07/2022 6.9  6.5 - 8.1 g/dL Final   Albumin 16/07/9603 3.7  3.5 - 5.0 g/dL Final   AST 54/06/8118 19  15 - 41 U/L Final   ALT 06/07/2022 22  0 - 44 U/L Final   Alkaline Phosphatase 06/07/2022 54  38 - 126 U/L Final   Total Bilirubin 06/07/2022 0.4  0.3 - 1.2 mg/dL Final   GFR, Estimated 06/07/2022 >60  >60 mL/min Final   Comment: (NOTE) Calculated using the CKD-EPI Creatinine Equation (2021)    Anion gap 06/07/2022 7  5 - 15 Final   Performed at [facility], [address], [place], [place] [address]   Hgb A1c MFr Bld 06/07/2022 5.0  4.8 - 5.6 % Final   Comment: (NOTE) Pre diabetes:          5.7%-6.4%  Diabetes:              >6.4%  Glycemic control for   <7.0% adults with diabetes    Mean Plasma Glucose 06/07/2022 96.8  mg/dL Final   Performed at [facility], [address], [place], [place] [address]   TSH 06/07/2022 1.620  0.350 - 4.500 uIU/mL Final   Comment: Performed by a 3rd Generation assay with a functional sensitivity of <=0.01 uIU/mL. Performed at [facility], [address], [place], [place] [address]    RPR Ser Ql 06/07/2022 NON REACTIVE  NON REACTIVE Final   Performed at  [facility], [address] [place], [place] [address]   Color, Urine 06/07/2022 YELLOW  YELLOW Final   APPearance 06/07/2022 HAZY (A)  CLEAR Final   Specific Gravity, Urine 06/07/2022 1.018  1.005 - 1.030 Final   pH 06/07/2022 7.0  5.0 - 8.0 Final   Glucose, UA 06/07/2022 NEGATIVE  NEGATIVE mg/dL Final   Hgb urine dipstick 06/07/2022 NEGATIVE  NEGATIVE Final   Bilirubin Urine 06/07/2022 NEGATIVE  NEGATIVE Final   Ketones, ur 06/07/2022 NEGATIVE  NEGATIVE mg/dL Final   Protein, ur 62/95/2841 NEGATIVE  NEGATIVE mg/dL Final   Nitrite 32/44/0102 NEGATIVE  NEGATIVE Final   Leukocytes,Ua 06/07/2022 NEGATIVE  NEGATIVE Final   Performed at [facility], [address], [place], [place] [address]   Cholesterol 06/07/2022 164  0 - 200 mg/dL Final   Triglycerides 64/40/3474 158 (H)  <150 mg/dL Final   HDL 25/95/6387 44  >40 mg/dL Final   Total CHOL/HDL Ratio 06/07/2022 3.7  RATIO Final   VLDL 06/07/2022 32  0 - 40 mg/dL Final   LDL Cholesterol 06/07/2022 88  0 - 99 mg/dL Final   Comment:        Total Cholesterol/HDL:CHD Risk Coronary Heart Disease Risk Table                     Men   Women  1/2 Average Risk   3.4   3.3  Average Risk       5.0   4.4  2 X Average Risk   9.6   7.1  3 X Average Risk  23.4   11.0        Use the calculated Patient Ratio above and the CHD Risk Table to determine the patient's CHD Risk.        ATP III CLASSIFICATION (LDL):  <100     mg/dL   Optimal  564-332  mg/dL   Near or Above  Optimal  130-159  mg/dL   Borderline  161-096  mg/dL   High  >045     mg/dL   Very High Performed at [facility], [address] [place], [place] [address]    HIV Screen 4th Generation wRfx 06/07/2022 Non Reactive  Non Reactive Final   Performed at [facility], [address], [place], [place] [address]   SARSCOV2ONAVIRUS 2 AG 06/07/2022 NEGATIVE  NEGATIVE Final   Comment: (NOTE) SARS-CoV-2 antigen NOT DETECTED.   Negative results  are presumptive.  Negative results do not preclude SARS-CoV-2 infection and should not be used as the sole basis for treatment or other patient management decisions, including infection  control decisions, particularly in the presence of clinical signs and  symptoms consistent with COVID-19, or in those who have been in contact with the virus.  Negative results must be combined with clinical observations, patient history, and epidemiological information. The expected result is Negative.  Fact Sheet for Patients: https://www.jennings-kim.com/  Fact Sheet for Healthcare Providers: https://alexander-rogers.biz/  This test is not yet approved or cleared by the [place] FDA and  has been authorized for detection and/or diagnosis of SARS-CoV-2 by FDA under an Emergency Use Authorization (EUA).  This EUA will remain in effect (meaning this test can be used) for the duration of  the COV                          ID-19 declaration under Section 564(b)(1) of the Act, 21 U.S.C. section 360bbb-3(b)(1), unless the authorization is terminated or revoked sooner.     POC Amphetamine UR 06/07/2022 None Detected  NONE DETECTED (Cut Off Level 1000 ng/mL) Final   POC Secobarbital (BAR) 06/07/2022 None Detected  NONE DETECTED (Cut Off Level 300 ng/mL) Final   POC Buprenorphine (BUP) 06/07/2022 None Detected  NONE DETECTED (Cut Off Level 10 ng/mL) Final   POC Oxazepam (BZO) 06/07/2022 None Detected  NONE DETECTED (Cut Off Level 300 ng/mL) Final   POC Cocaine UR 06/07/2022 None Detected  NONE DETECTED (Cut Off Level 300 ng/mL) Final   POC Methamphetamine UR 06/07/2022 None Detected  NONE DETECTED (Cut Off Level 1000 ng/mL) Final   POC Morphine 06/07/2022 None Detected  NONE DETECTED (Cut Off Level 300 ng/mL) Final   POC Methadone UR 06/07/2022 None Detected  NONE DETECTED (Cut Off Level 300 ng/mL) Final   POC Oxycodone UR 06/07/2022 None Detected  NONE DETECTED (Cut Off Level 100  ng/mL) Final   POC Marijuana UR 06/07/2022 None Detected  NONE DETECTED (Cut Off Level 50 ng/mL) Final   Preg Test, Ur 06/08/2022 NEGATIVE  NEGATIVE Final   Comment:        THE SENSITIVITY OF THIS METHODOLOGY IS >20 mIU/mL. Performed at [facility], [address], [place], [place] [address]    Preg Test, Ur 06/08/2022 NEGATIVE  NEGATIVE Final   Comment:        THE SENSITIVITY OF THIS METHODOLOGY IS >24 mIU/mL     Blood Alcohol level:  Lab Results  Component Value Date   ETH <10 07/04/2022   ETH <10 02/07/2022    Metabolic Disorder Labs: Lab Results  Component Value Date   HGBA1C 5.0 06/07/2022   MPG 96.8 06/07/2022   MPG 96.8 02/07/2022   No results found for: "PROLACTIN" Lab Results  Component Value Date   CHOL 181 07/25/2022   TRIG 118 07/25/2022   HDL 45 07/25/2022  CHOLHDL 4.0 07/25/2022   VLDL 24 07/25/2022   LDLCALC 112 (H) 07/25/2022   LDLCALC 88 06/07/2022    Therapeutic Lab Levels: No results found for: "LITHIUM" No results found for: "VALPROATE" No results found for: "CBMZ"  Physical Findings   GAD-7    Flowsheet Row [facility]17/[facility]  Total GAD-7 Score 17      PHQ2-9    Flowsheet Row ED from 06/07/2022 in [facility] Office Visit from 12/17/2021 [facility]  PHQ-2 Total Score 1 2  PHQ-9 Total Score 2 8      Flowsheet Row ED from 07/25/2022 in [facility] ED from 07/04/2022 in [facility] from 06/14/2022 in [facility]   C-SSRS RISK CATEGORY No Risk No Risk No Risk        Musculoskeletal  Strength & Muscle Tone: within normal limits Gait & Station: normal Patient leans: N/A  Psychiatric Specialty Exam  Presentation  General Appearance:  Appropriate for Environment  Eye Contact: Fair  Speech: Clear and Coherent  Speech  Volume: Normal  Handedness: Right   Mood and Affect  Mood: Euthymic  Affect: Appropriate   Thought Process  Thought Processes: Coherent  Descriptions of Associations:Intact  Orientation:Full (Time, Place and Person)  Thought Content:Logical  Diagnosis of Schizophrenia or Schizoaffective disorder in past: Yes  Duration of Psychotic Symptoms: Greater than six months   Hallucinations:Hallucinations: None  Ideas of Reference:None  Suicidal Thoughts:Suicidal Thoughts: No  Homicidal Thoughts:Homicidal Thoughts: No   Sensorium  Memory: Immediate Fair; Remote Fair; Recent Fair  Judgment: Intact  Insight: Present   Executive Functions  Concentration: Fair  Attention Span: Fair  Recall: [employer] of Knowledge: Fair  Language: Fair   Psychomotor Activity  Psychomotor Activity: Psychomotor Activity: Normal   Assets  Assets: Communication Skills; Desire for Improvement; Physical Health   Sleep  Sleep: Sleep: Fair   Physical Exam  Physical Exam HENT:     Head: Normocephalic.     Nose: Nose normal.  Eyes:     Conjunctiva/sclera: Conjunctivae normal.  Cardiovascular:     Rate and Rhythm: Normal rate.  Pulmonary:     Effort: Pulmonary effort is normal.  Musculoskeletal:        General: Normal range of motion.     Cervical back: Normal range of motion.  Neurological:     Mental Status: She is alert and oriented to person, place, and time.    Review of Systems  Constitutional: Negative.   HENT: Negative.    Eyes: Negative.   Respiratory: Negative.    Cardiovascular: Negative.   Gastrointestinal: Negative.   Genitourinary: Negative.   Musculoskeletal: Negative.   Neurological: Negative.   Endo/Heme/Allergies: Negative.    Blood pressure 112/86, pulse 95, temperature 98.3 F (36.8 C), temperature source Oral, resp. rate 18, SpO2 100 %. There is no height or weight on file to calculate BMI.  Treatment Plan  Summary: Disposition-Patient remains cleared by psychiatry. Transition of care team is pursuing appropriate placement.    Labs reviewed.  No new lab results.    Vital signs reviewed are WNL.   Current medication regimen. No medication changes on 09/26/22.   ARIPiprazole ER  400 mg Intramuscular Q28 days   docusate sodium  100 mg Oral Daily   levothyroxine  100 mcg Oral Q0600   loratadine  10 mg  Oral Daily   metFORMIN  500 mg Oral Q breakfast   nicotine  14 mg Transdermal Q0600   Oxcarbazepine  300 mg Oral BID   pantoprazole  20 mg Oral Daily   QUEtiapine  400 mg Oral BID   sertraline  150 mg Oral Daily   traZODone  100 mg Oral QHS   valACYclovir  500 mg Oral Daily    Ronel Rodeheaver L, NP 09/26/2022 10:27 AM

## 2022-09-26 NOTE — ED Notes (Signed)
Pt is sleeping. No distress noted. Will continue to monitor safety. 

## 2022-09-26 NOTE — ED Notes (Signed)
Pt A&O x 4, no distress noted, calm & cooperative.  Interactive with staff.  Monitoring for safety. 

## 2022-09-27 DIAGNOSIS — F333 Major depressive disorder, recurrent, severe with psychotic symptoms: Secondary | ICD-10-CM | POA: Diagnosis not present

## 2022-09-27 MED ORDER — POLYETHYLENE GLYCOL 3350 17 G PO PACK
17.0000 g | PACK | Freq: Every day | ORAL | Status: DC | PRN
  Administered 2022-09-28 – 2022-10-04 (×2): 17 g via ORAL
  Filled 2022-09-27 (×2): qty 1

## 2022-09-27 NOTE — Progress Notes (Signed)
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  Born at [facility] in [place], [place].   Also check with first pediatrician.    Went to [employer] in [facility] [address] , [place], [place] [address] Phone: [phone]   Fax: [phone]   Vesta Mixer for PSR in 2020 -   There is a hearing on 21 October 2022 for guardianship transfer to her father [name].  Next meeting is 01 October 2022 @ 1600.   Hansel Starling, MSW, [name] [facility] [phone] phone [phone] fax

## 2022-09-27 NOTE — ED Notes (Signed)
Pt sleeping at present, no distress noted.  Monitoring for safety. 

## 2022-09-27 NOTE — ED Notes (Signed)
Patient compliant with Abilify injection. No s/s of current distress.

## 2022-09-27 NOTE — ED Provider Notes (Signed)
Behavioral Health Progress Note  Date and Time: 09/27/2022 8:29 AM Name: Nicole [name] MRN:  [id number]   Per HPI: Nicole [name] is a 31 year old female with a past psychiatric history significant for schizophrenia and major depressive disorder with psychosis who voluntarily presented to [facility] on 07/25/2022 via [facility] after a verbal altercation with [name] (not patient's legal guardian) for transient SI. This is patient's fourth visit to [facility] for similar concerns this year. Patient has been dismissed from previous group home due to threatening suicidal and homicidal ideations, then leaving the group home. Legal Guardian: [name].  Subjective: Patient seen and evaluated face-to-face by this provider, and chart reviewed. On evaluation, patient is observed lying down in bed. She is alert and oriented x 4. Her thought process is linear and speech is clear and coherent at a moderate tone. Her mood is euthymic and affect is congruent. She has fair eye contact. She denies SI/HI/AVH. There is no objective evidence that the patient is currently responding to internal or external stimuli. She denies depressive symptoms. She reports good sleep. She reports a good appetite. She is compliant with taking scheduled medications and denies medication side effects currently. She denies physical complaints. She states that she is excited today because she has a team meeting and that her father was invited. She is hopeful she will get to go to the new group home with Ms. [name].   Diagnosis:  Final diagnoses:  Severe episode of recurrent major depressive disorder, with psychotic features (HCC)    Total Time spent with patient: 15 minutes  Past Psychiatric History: History of schizoaffective, borderline IDD, anxiety, and MDD.     Past Medical History:  Past Medical History:  Diagnosis Date   Anxiety    Depression     Hypothyroidism 08/07/2022   Tobacco use disorder 08/07/2022    Past Surgical History:  Procedure Laterality Date   WISDOM TOOTH EXTRACTION Bilateral 2020   Family History:  Family History  Problem Relation Age of Onset   Hypertension Father    Diabetes Father    Family Psychiatric  History: No history reported.  Social History:  Social History   Substance and Sexual Activity  Alcohol Use Never     Social History   Substance and Sexual Activity  Drug Use Never    Social History   Socioeconomic History   Marital status: Single    Spouse name: Not on file   Number of children: Not on file   Years of education: Not on file   Highest education level: Not on file  Occupational History   Not on file  Tobacco Use   Smoking status: Every Day    Types: Cigarettes   Smokeless tobacco: Never  Vaping Use   Vaping Use: Some days  Substance and Sexual Activity   Alcohol use: Never   Drug use: Never   Sexual activity: Yes    Partners: Female    Birth control/protection: Implant  Other Topics Concern   Not on file  Social History Narrative   Not on file   Social Determinants of Health   Financial Resource Strain: Not on file  Food Insecurity: Not on file  Transportation Needs: Not on file  Physical Activity: Not on file  Stress: Not on file  Social Connections: Not on file   SDOH:  SDOH Screenings   Depression (PHQ2-9): Low Risk  (06/10/2022)  Tobacco Use: High  Risk (09/24/2022)   Additional Social History:    Pain Medications: SEE MAR Prescriptions: SEE MAR Over the Counter: SEE MAR History of alcohol / drug use?: Yes Longest period of sobriety (when/how long): N/A Negative Consequences of Use:  (NONE) Withdrawal Symptoms: None Name of Substance 1: ETOH IN PAST--PT CURRENTLY DENIES 1 - Frequency: SOCIAL USE ETOH IN PAST 1 - Method of Aquiring: LEGAL 1- Route of Use: ORAL DRINK Name of Substance 2: NICOTINE-VAPE AND CIGARETTES 2 - Amount (size/oz): LESS  THAN ONE PACK 2 - Frequency: DAILY 2 - Method of Aquiring: LEGAL 2 - Route of Substance Use: ORAL SMOKE    Current Medications:  Current Facility-Administered Medications  Medication Dose Route Frequency Provider Last Rate Last Admin   acetaminophen (TYLENOL) tablet 650 mg  650 mg Oral Q6H PRN Onuoha, Chinwendu V, NP   650 mg at 09/21/22 2200   ARIPiprazole ER (ABILIFY MAINTENA) injection 400 mg  400 mg Intramuscular Q28 days Princess Bruins, DO   400 mg at 08/30/22 1338   docusate sodium (COLACE) capsule 100 mg  100 mg Oral Daily [name], MD   100 mg at 09/26/22 0917   hydrOXYzine (ATARAX) tablet 25 mg  25 mg Oral TID PRN Onuoha, Chinwendu V, NP   25 mg at 09/26/22 2126   ibuprofen (ADVIL) tablet 400 mg  400 mg Oral Q6H PRN Onuoha, Chinwendu V, NP   400 mg at 09/20/22 2115   levothyroxine (SYNTHROID) tablet 100 mcg  100 mcg Oral Q0600 Princess Bruins, DO   100 mcg at 09/27/22 0640   loratadine (CLARITIN) tablet 10 mg  10 mg Oral Daily Rankin, Shuvon B, NP   10 mg at 09/26/22 0918   metFORMIN (GLUCOPHAGE) tablet 500 mg  500 mg Oral Q breakfast Park Pope, MD   500 mg at 09/26/22 0846   nicotine (NICODERM CQ - dosed in mg/24 hours) patch 14 mg  14 mg Transdermal Q0600 Onuoha, Chinwendu V, NP   14 mg at 09/26/22 0847   ondansetron (ZOFRAN-ODT) disintegrating tablet 4 mg  4 mg Oral Q8H PRN Sindy Guadeloupe, NP   4 mg at 09/26/22 2127   Oxcarbazepine (TRILEPTAL) tablet 300 mg  300 mg Oral BID Onuoha, Chinwendu V, NP   300 mg at 09/26/22 2127   pantoprazole (PROTONIX) EC tablet 20 mg  20 mg Oral Daily Princess Bruins, DO   20 mg at 09/26/22 0918   QUEtiapine (SEROQUEL) tablet 400 mg  400 mg Oral BID Onuoha, Chinwendu V, NP   400 mg at 09/26/22 2127   sertraline (ZOLOFT) tablet 150 mg  150 mg Oral Daily Onuoha, Chinwendu V, NP   150 mg at 09/26/22 0917   simethicone (MYLICON) chewable tablet 80 mg  80 mg Oral Q6H PRN Sindy Guadeloupe, NP   80 mg at 09/25/22 2147   traZODone (DESYREL) tablet 100 mg  100  mg Oral QHS Ardis Hughs, NP   100 mg at 09/26/22 2127   valACYclovir (VALTREX) tablet 500 mg  500 mg Oral Daily Princess Bruins, DO   500 mg at 09/26/22 0918   ziprasidone (GEODON) injection 20 mg  20 mg Intramuscular Q12H PRN Onuoha, Chinwendu V, NP   20 mg at 09/19/22 2133   Current Outpatient Medications  Medication Sig Dispense Refill   ABILIFY MAINTENA 400 MG PRSY prefilled syringe 400 mg every 28 (twenty-eight) days.     cetirizine (ZYRTEC) 10 MG tablet Take 10 mg by mouth daily.     cyclobenzaprine (FLEXERIL)  10 MG tablet Take 1 tablet (10 mg total) by mouth 2 (two) times daily as needed for muscle spasms. 20 tablet 0   fluticasone (FLONASE) 50 MCG/ACT nasal spray Place 1 spray into both nostrils daily.     meloxicam (MOBIC) 15 MG tablet Take 15 mg by mouth daily.     Oxcarbazepine (TRILEPTAL) 300 MG tablet Take 1 tablet (300 mg total) by mouth 2 (two) times daily. 60 tablet 0   QUEtiapine (SEROQUEL) 400 MG tablet Take 1 tablet (400 mg total) by mouth 2 (two) times daily. 60 tablet 0   sertraline (ZOLOFT) 50 MG tablet Take 3 tablets (150 mg total) by mouth in the morning. 90 tablet 0   traZODone (DESYREL) 100 MG tablet Take 1 tablet (100 mg total) by mouth at bedtime. 30 tablet 0   valACYclovir (VALTREX) 500 MG tablet Take 500 mg by mouth daily.      Labs  Lab Results:  Admission on 07/25/2022  Component Date Value Ref Range Status   SARS Coronavirus 2 by RT PCR 07/25/2022 NEGATIVE  NEGATIVE Final   Comment: (NOTE) SARS-CoV-2 target nucleic acids are NOT DETECTED.  The SARS-CoV-2 RNA is generally detectable in upper respiratory specimens during the acute phase of infection. The lowest concentration of SARS-CoV-2 viral copies this assay can detect is 138 copies/mL. A negative result does not preclude SARS-Cov-2 infection and should not be used as the sole basis for treatment or other patient management decisions. A negative result may occur with  improper specimen  collection/handling, submission of specimen other than nasopharyngeal swab, presence of viral mutation(s) within the areas targeted by this assay, and inadequate number of viral copies(<138 copies/mL). A negative result must be combined with clinical observations, patient history, and epidemiological information. The expected result is Negative.  Fact Sheet for Patients:  BloggerCourse.com  Fact Sheet for Healthcare Providers:  SeriousBroker.it  This test is no                          t yet approved or cleared by the [place] FDA and  has been authorized for detection and/or diagnosis of SARS-CoV-2 by FDA under an Emergency Use Authorization (EUA). This EUA will remain  in effect (meaning this test can be used) for the duration of the COVID-19 declaration under Section 564(b)(1) of the Act, 21 U.S.C.section 360bbb-3(b)(1), unless the authorization is terminated  or revoked sooner.       Influenza A by PCR 07/25/2022 NEGATIVE  NEGATIVE Final   Influenza B by PCR 07/25/2022 NEGATIVE  NEGATIVE Final   Comment: (NOTE) The Xpert Xpress SARS-CoV-2/FLU/RSV plus assay is intended as an aid in the diagnosis of influenza from Nasopharyngeal swab specimens and should not be used as a sole basis for treatment. Nasal washings and aspirates are unacceptable for Xpert Xpress SARS-CoV-2/FLU/RSV testing.  Fact Sheet for Patients: BloggerCourse.com  Fact Sheet for Healthcare Providers: SeriousBroker.it  This test is not yet approved or cleared by the [place] FDA and has been authorized for detection and/or diagnosis of SARS-CoV-2 by FDA under an Emergency Use Authorization (EUA). This EUA will remain in effect (meaning this test can be used) for the duration of the COVID-19 declaration under Section 564(b)(1) of the Act, 21 U.S.C. section 360bbb-3(b)(1), unless the authorization is  terminated or revoked.  Performed at [facility], [address] [place], [place] [address]    WBC 07/25/2022 8.3  4.0 - 10.5 K/uL Final  RBC 07/25/2022 4.44  3.87 - 5.11 MIL/uL Final   Hemoglobin 07/25/2022 13.7  12.0 - 15.0 g/dL Final   HCT 76/19/5093 40.2  36.0 - 46.0 % Final   MCV 07/25/2022 90.5  80.0 - 100.0 fL Final   MCH 07/25/2022 30.9  26.0 - 34.0 pg Final   MCHC 07/25/2022 34.1  30.0 - 36.0 g/dL Final   RDW 26/71/2458 12.2  11.5 - 15.5 % Final   Platelets 07/25/2022 248  150 - 400 K/uL Final   nRBC 07/25/2022 0.0  0.0 - 0.2 % Final   Neutrophils Relative % 07/25/2022 43  % Final   Neutro Abs 07/25/2022 3.6  1.7 - 7.7 K/uL Final   Lymphocytes Relative 07/25/2022 52  % Final   Lymphs Abs 07/25/2022 4.2 (H)  0.7 - 4.0 K/uL Final   Monocytes Relative 07/25/2022 5  % Final   Monocytes Absolute 07/25/2022 0.4  0.1 - 1.0 K/uL Final   Eosinophils Relative 07/25/2022 0  % Final   Eosinophils Absolute 07/25/2022 0.0  0.0 - 0.5 K/uL Final   Basophils Relative 07/25/2022 0  % Final   Basophils Absolute 07/25/2022 0.0  0.0 - 0.1 K/uL Final   Immature Granulocytes 07/25/2022 0  % Final   Abs Immature Granulocytes 07/25/2022 0.02  0.00 - 0.07 K/uL Final   Performed at [facility], [address], [place], [place] [address]   Sodium 07/25/2022 138  135 - 145 mmol/L Final   Potassium 07/25/2022 4.0  3.5 - 5.1 mmol/L Final   Chloride 07/25/2022 104  98 - 111 mmol/L Final   CO2 07/25/2022 29  22 - 32 mmol/L Final   Glucose, Bld 07/25/2022 83  70 - 99 mg/dL Final   Glucose reference range applies only to samples taken after fasting for at least 8 hours.   BUN 07/25/2022 11  6 - 20 mg/dL Final   Creatinine, Ser 07/25/2022 0.97  0.44 - 1.00 mg/dL Final   Calcium 38/25/0539 9.2  8.9 - 10.3 mg/dL Final   Total Protein 76/73/4193 7.0  6.5 - 8.1 g/dL Final   Albumin 79/11/4095 3.8  3.5 - 5.0 g/dL Final   AST 35/32/9924 18  15 - 41 U/L Final   ALT 07/25/2022 22  0 - 44 U/L  Final   Alkaline Phosphatase 07/25/2022 64  38 - 126 U/L Final   Total Bilirubin 07/25/2022 0.2 (L)  0.3 - 1.2 mg/dL Final   GFR, Estimated 07/25/2022 >60  >60 mL/min Final   Comment: (NOTE) Calculated using the CKD-EPI Creatinine Equation (2021)    Anion gap 07/25/2022 5  5 - 15 Final   Performed at [facility], [address] [place], [place] [address]   POC Amphetamine UR 07/25/2022 None Detected  NONE DETECTED (Cut Off Level 1000 ng/mL) Preliminary   POC Secobarbital (BAR) 07/25/2022 None Detected  NONE DETECTED (Cut Off Level 300 ng/mL) Preliminary   POC Buprenorphine (BUP) 07/25/2022 None Detected  NONE DETECTED (Cut Off Level 10 ng/mL) Preliminary   POC Oxazepam (BZO) 07/25/2022 None Detected  NONE DETECTED (Cut Off Level 300 ng/mL) Preliminary   POC Cocaine UR 07/25/2022 None Detected  NONE DETECTED (Cut Off Level 300 ng/mL) Preliminary   POC Methamphetamine UR 07/25/2022 None Detected  NONE DETECTED (Cut Off Level 1000 ng/mL) Preliminary   POC Morphine 07/25/2022 None Detected  NONE DETECTED (Cut Off Level 300 ng/mL) Preliminary   POC Methadone UR 07/25/2022 None Detected  NONE DETECTED (Cut Off Level 300  ng/mL) Preliminary   POC Oxycodone UR 07/25/2022 None Detected  NONE DETECTED (Cut Off Level 100 ng/mL) Preliminary   POC Marijuana UR 07/25/2022 None Detected  NONE DETECTED (Cut Off Level 50 ng/mL) Preliminary   SARSCOV2ONAVIRUS 2 AG 07/25/2022 NEGATIVE  NEGATIVE Final   Comment: (NOTE) SARS-CoV-2 antigen NOT DETECTED.   Negative results are presumptive.  Negative results do not preclude SARS-CoV-2 infection and should not be used as the sole basis for treatment or other patient management decisions, including infection  control decisions, particularly in the presence of clinical signs and  symptoms consistent with COVID-19, or in those who have been in contact with the virus.  Negative results must be combined with clinical observations, patient history, and  epidemiological information. The expected result is Negative.  Fact Sheet for Patients: https://www.jennings-kim.com/  Fact Sheet for Healthcare Providers: https://alexander-rogers.biz/  This test is not yet approved or cleared by the [place] FDA and  has been authorized for detection and/or diagnosis of SARS-CoV-2 by FDA under an Emergency Use Authorization (EUA).  This EUA will remain in effect (meaning this test can be used) for the duration of  the COV                          ID-19 declaration under Section 564(b)(1) of the Act, 21 U.S.C. section 360bbb-3(b)(1), unless the authorization is terminated or revoked sooner.     Cholesterol 07/25/2022 181  0 - 200 mg/dL Final   Triglycerides 62/13/0865 118  <150 mg/dL Final   HDL 78/46/9629 45  >40 mg/dL Final   Total CHOL/HDL Ratio 07/25/2022 4.0  RATIO Final   VLDL 07/25/2022 24  0 - 40 mg/dL Final   LDL Cholesterol 07/25/2022 112 (H)  0 - 99 mg/dL Final   Comment:        Total Cholesterol/HDL:CHD Risk Coronary Heart Disease Risk Table                     Men   Women  1/2 Average Risk   3.4   3.3  Average Risk       5.0   4.4  2 X Average Risk   9.6   7.1  3 X Average Risk  23.4   11.0        Use the calculated Patient Ratio above and the CHD Risk Table to determine the patient's CHD Risk.        ATP III CLASSIFICATION (LDL):  <100     mg/dL   Optimal  528-413  mg/dL   Near or Above                    Optimal  130-159  mg/dL   Borderline  244-010  mg/dL   High  >272     mg/dL   Very High Performed at [facility], [address], [place], [place] [address]    TSH 07/25/2022 6.668 (H)  0.350 - 4.500 uIU/mL Final   Comment: Performed by a 3rd Generation assay with a functional sensitivity of <=0.01 uIU/mL. Performed at [facility], [address], [place], [place] [address]    Glucose-Capillary 07/26/2022 104 (H)  70 - 99 mg/dL Final   Glucose reference range applies only to  samples taken after fasting for at least 8 hours.   T3, Free 07/31/2022 2.3  2.0 - 4.4 pg/mL Final   Comment: (NOTE) Performed At: [facility]  [place] [address] [place], [place] [id number] Jolene Schimke MD WU:[id number]    Free T4 07/31/2022 0.60 (L)  0.61 - 1.12 ng/dL Final   Comment: (NOTE) Biotin ingestion may interfere with free T4 tests. If the results are inconsistent with the TSH level, previous test results, or the clinical presentation, then consider biotin interference. If needed, order repeat testing after stopping biotin. Performed at [facility], [address], [place], [place] [address]    Glucose-Capillary 08/29/2022 100 (H)  70 - 99 mg/dL Final   Glucose reference range applies only to samples taken after fasting for at least 8 hours.   TSH 09/05/2022 0.793  0.350 - 4.500 uIU/mL Final   Comment: Performed by a 3rd Generation assay with a functional sensitivity of <=0.01 uIU/mL. Performed at [facility], [address], [place], [place] [address]    Free T4 09/05/2022 0.73  0.61 - 1.12 ng/dL Final   Comment: (NOTE) Biotin ingestion may interfere with free T4 tests. If the results are inconsistent with the TSH level, previous test results, or the clinical presentation, then consider biotin interference. If needed, order repeat testing after stopping biotin. Performed at [facility], [address], [place], [place] [address]    Preg Test, Ur 09/06/2022 Negative  Negative Final   Preg Test, Ur 09/06/2022 NEGATIVE  NEGATIVE Final   Comment:        THE SENSITIVITY OF THIS METHODOLOGY IS >24 mIU/mL    Preg Test, Ur 09/05/2022 NEGATIVE  NEGATIVE Final   Comment:        THE SENSITIVITY OF THIS METHODOLOGY IS >24 mIU/mL    Sodium 09/13/2022 136  135 - 145 mmol/L Final   Potassium 09/13/2022 4.5  3.5 - 5.1 mmol/L Final   Chloride 09/13/2022 105  98 - 111 mmol/L Final   CO2 09/13/2022 21 (L)  22 - 32 mmol/L Final   Glucose, Bld  09/13/2022 111 (H)  70 - 99 mg/dL Final   Glucose reference range applies only to samples taken after fasting for at least 8 hours.   BUN 09/13/2022 14  6 - 20 mg/dL Final   Creatinine, Ser 09/13/2022 0.92  0.44 - 1.00 mg/dL Final   Calcium 62/95/2841 9.1  8.9 - 10.3 mg/dL Final   GFR, Estimated 09/13/2022 >60  >60 mL/min Final   Comment: (NOTE) Calculated using the CKD-EPI Creatinine Equation (2021)    Anion gap 09/13/2022 10  5 - 15 Final   Performed at [facility], [address], [place], [place] [address]   Vitamin B-12 09/13/2022 585  180 - 914 pg/mL Final   Comment: (NOTE) This assay is not validated for testing neonatal or myeloproliferative syndrome specimens for Vitamin B12 levels. Performed at [facility], [address] [place], [place] [address]    Color, Urine 09/14/2022 YELLOW  YELLOW Final   APPearance 09/14/2022 HAZY (A)  CLEAR Final   Specific Gravity, Urine 09/14/2022 1.025  1.005 - 1.030 Final   pH 09/14/2022 5.0  5.0 - 8.0 Final   Glucose, UA 09/14/2022 NEGATIVE  NEGATIVE mg/dL Final   Hgb urine dipstick 09/14/2022 NEGATIVE  NEGATIVE Final   Bilirubin Urine 09/14/2022 NEGATIVE  NEGATIVE Final   Ketones, ur 09/14/2022 NEGATIVE  NEGATIVE mg/dL Final   Protein, ur 53/66/4403 NEGATIVE  NEGATIVE mg/dL Final   Nitrite 47/42/5956 NEGATIVE  NEGATIVE Final   Leukocytes,Ua 09/14/2022 TRACE (A)  NEGATIVE Final   RBC / HPF 09/14/2022 0-5  0 - 5 RBC/hpf Final   WBC, UA 09/14/2022 0-5  0 - 5 WBC/hpf Final   Bacteria, UA 09/14/2022 RARE (A)  NONE SEEN Final   Squamous Epithelial / LPF 09/14/2022 0-5  0 - 5 Final   Mucus 09/14/2022 PRESENT   Final   Performed at [facility], [address], [place], [place] [address]   Glucose-Capillary 09/16/2022 105 (H)  70 - 99 mg/dL Final   Glucose reference range applies only to samples taken after fasting for at least 8 hours.  Admission on 07/04/2022, Discharged on 07/05/2022  Component Date Value Ref Range  Status   Sodium 07/04/2022 142  135 - 145 mmol/L Final   Potassium 07/04/2022 4.4  3.5 - 5.1 mmol/L Final   Chloride 07/04/2022 109  98 - 111 mmol/L Final   CO2 07/04/2022 26  22 - 32 mmol/L Final   Glucose, Bld 07/04/2022 96  70 - 99 mg/dL Final   Glucose reference range applies only to samples taken after fasting for at least 8 hours.   BUN 07/04/2022 15  6 - 20 mg/dL Final   Creatinine, Ser 07/04/2022 0.83  0.44 - 1.00 mg/dL Final   Calcium 71/03/2693 9.3  8.9 - 10.3 mg/dL Final   Total Protein 85/46/2703 7.2  6.5 - 8.1 g/dL Final   Albumin 50/06/3817 3.9  3.5 - 5.0 g/dL Final   AST 29/93/7169 23  15 - 41 U/L Final   ALT 07/04/2022 28  0 - 44 U/L Final   Alkaline Phosphatase 07/04/2022 77  38 - 126 U/L Final   Total Bilirubin 07/04/2022 0.4  0.3 - 1.2 mg/dL Final   GFR, Estimated 07/04/2022 >60  >60 mL/min Final   Comment: (NOTE) Calculated using the CKD-EPI Creatinine Equation (2021)    Anion gap 07/04/2022 7  5 - 15 Final   Performed at [facility], [address] [place], [place] [address]   Alcohol, Ethyl (B) 07/04/2022 <10  <10 mg/dL Final   Comment: (NOTE) Lowest detectable limit for serum alcohol is 10 mg/dL.  For medical purposes only. Performed at [facility], [address] [place], [place] [address]    WBC 07/04/2022 7.0  4.0 - 10.5 K/uL Final   RBC 07/04/2022 4.18  3.87 - 5.11 MIL/uL Final   Hemoglobin 07/04/2022 12.8  12.0 - 15.0 g/dL Final   HCT 51/11/5850 39.1  36.0 - 46.0 % Final   MCV 07/04/2022 93.5  80.0 - 100.0 fL Final   MCH 07/04/2022 30.6  26.0 - 34.0 pg Final   MCHC 07/04/2022 32.7  30.0 - 36.0 g/dL Final   RDW 77/82/4235 12.9  11.5 - 15.5 % Final   Platelets 07/04/2022 243  150 - 400 K/uL Final   nRBC 07/04/2022 0.0  0.0 - 0.2 % Final   Neutrophils Relative % 07/04/2022 43  % Final   Neutro Abs 07/04/2022 3.0  1.7 - 7.7 K/uL Final   Lymphocytes Relative 07/04/2022 50  % Final   Lymphs Abs 07/04/2022 3.5   0.7 - 4.0 K/uL Final   Monocytes Relative 07/04/2022 7  % Final   Monocytes Absolute 07/04/2022 0.5  0.1 - 1.0 K/uL Final   Eosinophils Relative 07/04/2022 0  % Final   Eosinophils Absolute 07/04/2022 0.0  0.0 - 0.5 K/uL Final   Basophils Relative 07/04/2022 0  % Final   Basophils Absolute 07/04/2022 0.0  0.0 - 0.1 K/uL Final   Immature Granulocytes 07/04/2022 0  %  Final   Abs Immature Granulocytes 07/04/2022 0.01  0.00 - 0.07 K/uL Final   Performed at [facility], [address], [place], [place] [address]   I-stat hCG, quantitative 07/04/2022 <5.0  <5 mIU/mL Final   Comment 3 07/04/2022          Final   Comment:   GEST. AGE      CONC.  (mIU/mL)   <=1 WEEK        5 - 50     2 WEEKS       50 - 500     3 WEEKS       100 - 10,000     4 WEEKS     1,000 - 30,000        FEMALE AND NON-PREGNANT FEMALE:     LESS THAN 5 mIU/mL   Admission on 06/14/2022, Discharged on 06/14/2022  Component Date Value Ref Range Status   Color, UA 06/14/2022 yellow  yellow Final   Clarity, UA 06/14/2022 cloudy (A)  clear Final   Glucose, UA 06/14/2022 negative  negative mg/dL Final   Bilirubin, UA 30/86/5784 negative  negative Final   Ketones, POC UA 06/14/2022 negative  negative mg/dL Final   Spec Grav, UA 69/62/9528 1.025  1.010 - 1.025 Final   Blood, UA 06/14/2022 negative  negative Final   pH, UA 06/14/2022 7.5  5.0 - 8.0 Final   Protein Ur, POC 06/14/2022 =30 (A)  negative mg/dL Final   Urobilinogen, UA 06/14/2022 0.2  0.2 or 1.0 E.U./dL Final   Nitrite, UA 41/32/4401 Negative  Negative Final   Leukocytes, UA 06/14/2022 Negative  Negative Final   Preg Test, Ur 06/14/2022 Negative  Negative Final   Specimen Description 06/14/2022 URINE, CLEAN CATCH   Final   Special Requests 06/14/2022    Final                   Value:NONE Performed at [facility], [address] [place], [place] [address]    Culture 06/14/2022 MULTIPLE SPECIES PRESENT, SUGGEST RECOLLECTION (A)   Final    Report Status 06/14/2022 06/16/2022 FINAL   Final  Admission on 06/07/2022, Discharged on 06/10/2022  Component Date Value Ref Range Status   SARS Coronavirus 2 by RT PCR 06/07/2022 NEGATIVE  NEGATIVE Final   Comment: (NOTE) SARS-CoV-2 target nucleic acids are NOT DETECTED.  The SARS-CoV-2 RNA is generally detectable in upper respiratory specimens during the acute phase of infection. The lowest concentration of SARS-CoV-2 viral copies this assay can detect is 138 copies/mL. A negative result does not preclude SARS-Cov-2 infection and should not be used as the sole basis for treatment or other patient management decisions. A negative result may occur with  improper specimen collection/handling, submission of specimen other than nasopharyngeal swab, presence of viral mutation(s) within the areas targeted by this assay, and inadequate number of viral copies(<138 copies/mL). A negative result must be combined with clinical observations, patient history, and epidemiological information. The expected result is Negative.  Fact Sheet for Patients:  BloggerCourse.com  Fact Sheet for Healthcare Providers:  SeriousBroker.it  This test is no                          t yet approved or cleared by the [place] FDA and  has been authorized for detection and/or diagnosis of SARS-CoV-2 by FDA under an Emergency Use Authorization (EUA). This EUA will remain  in effect (meaning  this test can be used) for the duration of the COVID-19 declaration under Section 564(b)(1) of the Act, 21 U.S.C.section 360bbb-3(b)(1), unless the authorization is terminated  or revoked sooner.       Influenza A by PCR 06/07/2022 NEGATIVE  NEGATIVE Final   Influenza B by PCR 06/07/2022 NEGATIVE  NEGATIVE Final   Comment: (NOTE) The Xpert Xpress SARS-CoV-2/FLU/RSV plus assay is intended as an aid in the diagnosis of influenza from Nasopharyngeal swab specimens  and should not be used as a sole basis for treatment. Nasal washings and aspirates are unacceptable for Xpert Xpress SARS-CoV-2/FLU/RSV testing.  Fact Sheet for Patients: BloggerCourse.com  Fact Sheet for Healthcare Providers: SeriousBroker.it  This test is not yet approved or cleared by the [place] FDA and has been authorized for detection and/or diagnosis of SARS-CoV-2 by FDA under an Emergency Use Authorization (EUA). This EUA will remain in effect (meaning this test can be used) for the duration of the COVID-19 declaration under Section 564(b)(1) of the Act, 21 U.S.C. section 360bbb-3(b)(1), unless the authorization is terminated or revoked.  Performed at [facility], [address] [place], [place] [address]    WBC 06/07/2022 5.7  4.0 - 10.5 K/uL Final   RBC 06/07/2022 4.39  3.87 - 5.11 MIL/uL Final   Hemoglobin 06/07/2022 13.2  12.0 - 15.0 g/dL Final   HCT 60/45/4098 40.4  36.0 - 46.0 % Final   MCV 06/07/2022 92.0  80.0 - 100.0 fL Final   MCH 06/07/2022 30.1  26.0 - 34.0 pg Final   MCHC 06/07/2022 32.7  30.0 - 36.0 g/dL Final   RDW 11/91/4782 12.4  11.5 - 15.5 % Final   Platelets 06/07/2022 308  150 - 400 K/uL Final   nRBC 06/07/2022 0.0  0.0 - 0.2 % Final   Neutrophils Relative % 06/07/2022 42  % Final   Neutro Abs 06/07/2022 2.4  1.7 - 7.7 K/uL Final   Lymphocytes Relative 06/07/2022 54  % Final   Lymphs Abs 06/07/2022 3.1  0.7 - 4.0 K/uL Final   Monocytes Relative 06/07/2022 4  % Final   Monocytes Absolute 06/07/2022 0.2  0.1 - 1.0 K/uL Final   Eosinophils Relative 06/07/2022 0  % Final   Eosinophils Absolute 06/07/2022 0.0  0.0 - 0.5 K/uL Final   Basophils Relative 06/07/2022 0  % Final   Basophils Absolute 06/07/2022 0.0  0.0 - 0.1 K/uL Final   Immature Granulocytes 06/07/2022 0  % Final   Abs Immature Granulocytes 06/07/2022 0.01  0.00 - 0.07 K/uL Final   Performed at [facility], [address] [place], [place] [address]   Sodium 06/07/2022 139  135 - 145 mmol/L Final   Potassium 06/07/2022 4.0  3.5 - 5.1 mmol/L Final   Chloride 06/07/2022 104  98 - 111 mmol/L Final   CO2 06/07/2022 28  22 - 32 mmol/L Final   Glucose, Bld 06/07/2022 104 (H)  70 - 99 mg/dL Final   Glucose reference range applies only to samples taken after fasting for at least 8 hours.   BUN 06/07/2022 8  6 - 20 mg/dL Final   Creatinine, Ser 06/07/2022 0.84  0.44 - 1.00 mg/dL Final   Calcium 30/86/5784 9.1  8.9 - 10.3 mg/dL Final   Total Protein 69/62/9528 6.9  6.5 - 8.1 g/dL Final   Albumin 41/32/4401 3.7  3.5 - 5.0 g/dL Final   AST 02/72/5366 19  15 - 41 U/L Final   ALT 06/07/2022 22  0 - 44 U/L Final   Alkaline Phosphatase 06/07/2022 54  38 - 126 U/L Final   Total Bilirubin 06/07/2022 0.4  0.3 - 1.2 mg/dL Final   GFR, Estimated 06/07/2022 >60  >60 mL/min Final   Comment: (NOTE) Calculated using the CKD-EPI Creatinine Equation (2021)    Anion gap 06/07/2022 7  5 - 15 Final   Performed at [facility], [address], [place], [place] [address]   Hgb A1c MFr Bld 06/07/2022 5.0  4.8 - 5.6 % Final   Comment: (NOTE) Pre diabetes:          5.7%-6.4%  Diabetes:              >6.4%  Glycemic control for   <7.0% adults with diabetes    Mean Plasma Glucose 06/07/2022 96.8  mg/dL Final   Performed at [facility], [address] [place], [place] [address]   TSH 06/07/2022 1.620  0.350 - 4.500 uIU/mL Final   Comment: Performed by a 3rd Generation assay with a functional sensitivity of <=0.01 uIU/mL. Performed at [facility], [address] [place], [place] [address]    RPR Ser Ql 06/07/2022 NON REACTIVE  NON REACTIVE Final   Performed at [facility], [address], [place], [place] [address]   Color, Urine 06/07/2022 YELLOW  YELLOW Final   APPearance 06/07/2022 HAZY (A)  CLEAR Final   Specific Gravity, Urine 06/07/2022 1.018  1.005 - 1.030 Final   pH 06/07/2022 7.0  5.0 -  8.0 Final   Glucose, UA 06/07/2022 NEGATIVE  NEGATIVE mg/dL Final   Hgb urine dipstick 06/07/2022 NEGATIVE  NEGATIVE Final   Bilirubin Urine 06/07/2022 NEGATIVE  NEGATIVE Final   Ketones, ur 06/07/2022 NEGATIVE  NEGATIVE mg/dL Final   Protein, ur 29/56/2130 NEGATIVE  NEGATIVE mg/dL Final   Nitrite 86/57/8469 NEGATIVE  NEGATIVE Final   Leukocytes,Ua 06/07/2022 NEGATIVE  NEGATIVE Final   Performed at [facility], [address], [place], [place] [address]   Cholesterol 06/07/2022 164  0 - 200 mg/dL Final   Triglycerides 84/13/2440 158 (H)  <150 mg/dL Final   HDL 08/10/2535 44  >40 mg/dL Final   Total CHOL/HDL Ratio 06/07/2022 3.7  RATIO Final   VLDL 06/07/2022 32  0 - 40 mg/dL Final   LDL Cholesterol 06/07/2022 88  0 - 99 mg/dL Final   Comment:        Total Cholesterol/HDL:CHD Risk Coronary Heart Disease Risk Table                     Men   Women  1/2 Average Risk   3.4   3.3  Average Risk       5.0   4.4  2 X Average Risk   9.6   7.1  3 X Average Risk  23.4   11.0        Use the calculated Patient Ratio above and the CHD Risk Table to determine the patient's CHD Risk.        ATP III CLASSIFICATION (LDL):  <100     mg/dL   Optimal  644-034  mg/dL   Near or Above                    Optimal  130-159  mg/dL   Borderline  742-595  mg/dL   High  >638     mg/dL   Very High Performed at [facility], [address] [place], [place] [address]    HIV Screen 4th Generation wRfx 06/07/2022 Non Reactive  Non Reactive Final   Performed at [facility], [address], [place], [place] [address]   SARSCOV2ONAVIRUS 2 AG 06/07/2022 NEGATIVE  NEGATIVE Final   Comment: (NOTE) SARS-CoV-2 antigen NOT DETECTED.   Negative results are presumptive.  Negative results do not preclude SARS-CoV-2 infection and should not be used as the sole basis for treatment or other patient management decisions, including infection  control decisions, particularly in the presence of clinical  signs and  symptoms consistent with COVID-19, or in those who have been in contact with the virus.  Negative results must be combined with clinical observations, patient history, and epidemiological information. The expected result is Negative.  Fact Sheet for Patients: https://www.jennings-kim.com/  Fact Sheet for Healthcare Providers: https://alexander-rogers.biz/  This test is not yet approved or cleared by the [place] FDA and  has been authorized for detection and/or diagnosis of SARS-CoV-2 by FDA under an Emergency Use Authorization (EUA).  This EUA will remain in effect (meaning this test can be used) for the duration of  the COV                          ID-19 declaration under Section 564(b)(1) of the Act, 21 U.S.C. section 360bbb-3(b)(1), unless the authorization is terminated or revoked sooner.     POC Amphetamine UR 06/07/2022 None Detected  NONE DETECTED (Cut Off Level 1000 ng/mL) Final   POC Secobarbital (BAR) 06/07/2022 None Detected  NONE DETECTED (Cut Off Level 300 ng/mL) Final   POC Buprenorphine (BUP) 06/07/2022 None Detected  NONE DETECTED (Cut Off Level 10 ng/mL) Final   POC Oxazepam (BZO) 06/07/2022 None Detected  NONE DETECTED (Cut Off Level 300 ng/mL) Final   POC Cocaine UR 06/07/2022 None Detected  NONE DETECTED (Cut Off Level 300 ng/mL) Final   POC Methamphetamine UR 06/07/2022 None Detected  NONE DETECTED (Cut Off Level 1000 ng/mL) Final   POC Morphine 06/07/2022 None Detected  NONE DETECTED (Cut Off Level 300 ng/mL) Final   POC Methadone UR 06/07/2022 None Detected  NONE DETECTED (Cut Off Level 300 ng/mL) Final   POC Oxycodone UR 06/07/2022 None Detected  NONE DETECTED (Cut Off Level 100 ng/mL) Final   POC Marijuana UR 06/07/2022 None Detected  NONE DETECTED (Cut Off Level 50 ng/mL) Final   Preg Test, Ur 06/08/2022 NEGATIVE  NEGATIVE Final   Comment:        THE SENSITIVITY OF THIS METHODOLOGY IS >20 mIU/mL. Performed at [facility], [address], [place], [place] [address]    Preg Test, Ur 06/08/2022 NEGATIVE  NEGATIVE Final   Comment:        THE SENSITIVITY OF THIS METHODOLOGY IS >24 mIU/mL     Blood Alcohol level:  Lab Results  Component Value Date   ETH <10 07/04/2022   ETH <10 02/07/2022    Metabolic Disorder Labs: Lab Results  Component Value Date   HGBA1C 5.0 06/07/2022   MPG 96.8 06/07/2022   MPG 96.8 02/07/2022   No results found for: "PROLACTIN" Lab Results  Component Value Date   CHOL 181 07/25/2022   TRIG 118 07/25/2022   HDL 45 07/25/2022   CHOLHDL 4.0 07/25/2022   VLDL 24 07/25/2022   LDLCALC 112 (H) 07/25/2022   LDLCALC 88 06/07/2022    Therapeutic Lab Levels: No results found for: "LITHIUM"  No results found for: "VALPROATE" No results found for: "CBMZ"  Physical Findings   GAD-7    Flowsheet Row [facility]  Total GAD-7 Score 17      PHQ2-9    Flowsheet [facility]  PHQ-2 Total Score 1 2  PHQ-9 Total Score 2 8      Flowsheet Row [facility]   C-SSRS RISK CATEGORY No Risk No Risk No Risk        Musculoskeletal  Strength & Muscle Tone: within normal limits Gait & Station: normal Patient leans: N/A  Psychiatric Specialty Exam  Presentation  General Appearance:  Appropriate for Environment  Eye Contact: Fair  Speech: Clear and Coherent  Speech Volume: Normal  Handedness: Right   Mood and Affect  Mood: Euthymic  Affect: Appropriate   Thought Process  Thought Processes: Coherent  Descriptions of Associations:Intact  Orientation:Full (Time, Place and Person)  Thought  Content:Logical  Diagnosis of Schizophrenia or Schizoaffective disorder in past: Yes  Duration of Psychotic Symptoms: Greater than six months   Hallucinations:Hallucinations: None  Ideas of Reference:None  Suicidal Thoughts:Suicidal Thoughts: No  Homicidal Thoughts:Homicidal Thoughts: No   Sensorium  Memory: Immediate Fair; Recent Fair; Remote Fair  Judgment: Intact  Insight: Present   Executive Functions  Concentration: Fair  Attention Span: Fair  Recall: FiservFair  Fund of Knowledge: Fair  Language: Fair   Psychomotor Activity  Psychomotor Activity: Psychomotor Activity: Normal   Assets  Assets: Communication Skills; Desire for Improvement; Physical Health   Sleep  Sleep: Sleep: Fair    Physical Exam  Physical Exam HENT:     Head: Normocephalic.     Nose: Nose normal.  Eyes:     Conjunctiva/sclera: Conjunctivae normal.  Cardiovascular:     Rate and Rhythm: Tachycardia present.  Pulmonary:     Effort: Pulmonary effort is normal.  Musculoskeletal:        General: Normal range of motion.     Cervical back: Normal range of motion.  Neurological:     Mental Status: She is alert and oriented to person, place, and time.    Review of Systems  Constitutional: Negative.   HENT: Negative.    Eyes: Negative.   Respiratory: Negative.  Negative for shortness of breath.   Cardiovascular: Negative.  Negative for chest pain and palpitations.  Gastrointestinal: Negative.   Genitourinary: Negative.   Musculoskeletal: Negative.   Neurological: Negative.   Endo/Heme/Allergies: Negative.    Blood pressure (!) 101/54, pulse (!) 106, temperature 98 F (36.7 C), temperature source Oral, resp. rate 16, SpO2 97 %. There is no height or weight on file to calculate BMI.  Treatment Plan Summary Disposition-Patient remains cleared by psychiatry. Transition of care team is pursuing appropriate placement.    Labs reviewed.  No new lab results.    Vital  signs reviewed, mild tachycardia. Will continue to monitor vital signs and encourage fluids.    Current medication regimen. No medication changes on 09/27/22.   ARIPiprazole ER  400 mg Intramuscular Q28 days   docusate sodium  100 mg Oral Daily   levothyroxine  100 mcg Oral Q0600   loratadine  10 mg Oral Daily   metFORMIN  500 mg Oral Q breakfast   nicotine  14 mg Transdermal Q0600   Oxcarbazepine  300 mg Oral BID   pantoprazole  20 mg Oral Daily   QUEtiapine  400 mg Oral BID   sertraline  150 mg Oral Daily   traZODone  100 mg Oral QHS   valACYclovir  500 mg Oral Daily    Tyrik Stetzer L, NP 09/27/2022 8:29 AM

## 2022-09-27 NOTE — ED Notes (Signed)
Pt is in the bed watching TV. Respirations are even and unlabored. No acute distress noted. Will continue to monitor for safety.  

## 2022-09-27 NOTE — ED Notes (Signed)
[name]&Ox4. Patient denies SI,HI, and A/V/H with no plan/intent. Patient med compliant. Patient ate breakfast and socializes appropriately on unit. Remains cooperative and safe on unit at this time.

## 2022-09-27 NOTE — ED Notes (Signed)
Pt is sleeping. No distress noted. Will continue to monitor safety. 

## 2022-09-28 DIAGNOSIS — F333 Major depressive disorder, recurrent, severe with psychotic symptoms: Secondary | ICD-10-CM | POA: Diagnosis not present

## 2022-09-28 NOTE — ED Notes (Signed)
Pt sleeping at present, no distress noted.  Monitoring for safety. 

## 2022-09-28 NOTE — ED Notes (Signed)
Pt is resting quietly, in no acute distress. Pt respirations even and unlabored. Pt is safe.

## 2022-09-28 NOTE — ED Notes (Signed)
Pt is sleeping. No distress noted. Will continue to monitor safety. 

## 2022-09-28 NOTE — ED Notes (Signed)
Pt A&O x 4,  coloring at present, no distress noted. Calm & cooperative.  Monitoring for safety.  Denies SI, HI or AVH.

## 2022-09-28 NOTE — ED Provider Notes (Signed)
Behavioral Health Progress Note  Date and Time: 09/28/2022 12:17 PM Name: Nicole [name] MRN:  [id number]  Per HPI: Nicole [name] is a 31 year old female with a past psychiatric history significant for schizophrenia and major depressive disorder with psychosis who voluntarily presented to [facility] on 07/25/2022 via [facility] after a verbal altercation with [name] (not patient's legal guardian) for transient SI. This is patient's fourth visit to [facility] for similar concerns this year. Patient has been dismissed from previous group home due to threatening suicidal and homicidal ideations, then leaving the group home. Legal Guardian: [name]) transitioning to be [name]) Point of contact: [name]).  Subjective:  Patient seen and evaluated face-to-face by this provider, and chart reviewed. On evaluation, patient is observed writing a letter. She is alert and oriented x 4. Her thought process is linear, and speech is clear and coherent at a moderate tone. Her mood is euthymic, and affect is congruent. She has fair eye contact. She denies SI/HI/AVH. There is no objective evidence that the patient is currently responding to internal or external stimuli. She denies depressive symptoms. She reports good sleep. She reports a good appetite. She is compliant with taking scheduled medications and denies medication side effects currently. She denies physical complaints. She states that her meeting with well yesterday and she may be leaving next week to go to a new ALF with Ms. Doren Custard. She states that she wishes she could do some activities while she's here to make crafts for Christmas. She denies further concerns at this time.   Diagnosis:  Final diagnoses:  Severe episode of recurrent major depressive disorder, with psychotic features ([place])   Total Time spent with patient: 15 minutes  Past Psychiatric History: History of schizoaffective, borderline IDD, anxiety, and MDD.      Past  Medical History:  Past Medical History:  Diagnosis Date   Anxiety    Depression    Hypothyroidism 08/07/2022   Tobacco use disorder 08/07/2022    Past Surgical History:  Procedure Laterality Date   WISDOM TOOTH EXTRACTION Bilateral 2020   Family History:  Family History  Problem Relation Age of Onset   Hypertension Father    Diabetes Father    Family Psychiatric  History: No history reported.  Social History:  Social History   Substance and Sexual Activity  Alcohol Use Never     Social History   Substance and Sexual Activity  Drug Use Never    Social History   Socioeconomic History   Marital status: Single    Spouse name: Not on file   Number of children: Not on file   Years of education: Not on file   Highest education level: Not on file  Occupational History   Not on file  Tobacco Use   Smoking status: Every Day    Types: Cigarettes   Smokeless tobacco: Never  Vaping Use   Vaping Use: Some days  Substance and Sexual Activity   Alcohol use: Never   Drug use: Never   Sexual activity: Yes    Partners: Female    Birth control/protection: Implant  Other Topics Concern   Not on file  Social History Narrative   Not on file   Social Determinants of Health   Financial Resource Strain: Not on file  Food Insecurity: Not on file  Transportation Needs: Not on file  Physical Activity: Not on file  Stress: Not on file  Social Connections: Not on file   SDOH:  SDOH  Screenings   Depression (PHQ2-9): Low Risk  (06/10/2022)  Tobacco Use: High Risk (09/24/2022)   Additional Social History:    Pain Medications: SEE MAR Prescriptions: SEE MAR Over the Counter: SEE MAR History of alcohol / drug use?: Yes Longest period of sobriety (when/how long): N/A Negative Consequences of Use:  (NONE) Withdrawal Symptoms: None Name of Substance 1: ETOH IN PAST--PT CURRENTLY DENIES 1 - Frequency: SOCIAL USE ETOH IN PAST 1 - Method of Aquiring: LEGAL 1- Route of Use:  ORAL DRINK Name of Substance 2: [place] 2 - Amount (size/oz): LESS THAN ONE PACK 2 - Frequency: DAILY 2 - Method of Aquiring: LEGAL 2 - Route of Substance Use: ORAL SMOKE   Current Medications:  Current Facility-Administered Medications  Medication Dose Route Frequency Provider Last Rate Last Admin   acetaminophen (TYLENOL) tablet 650 mg  650 mg Oral Q6H PRN Onuoha, Chinwendu V, NP   650 mg at 09/21/22 2200   ARIPiprazole ER (ABILIFY MAINTENA) injection 400 mg  400 mg Intramuscular Q28 days Merrily Brittle, DO   400 mg at 09/27/22 1445   docusate sodium (COLACE) capsule 100 mg  100 mg Oral Daily Rosezetta Schlatter, MD   100 mg at 09/28/22 0913   hydrOXYzine (ATARAX) tablet 25 mg  25 mg Oral TID PRN Onuoha, Chinwendu V, NP   25 mg at 09/26/22 2126   ibuprofen (ADVIL) tablet 400 mg  400 mg Oral Q6H PRN Onuoha, Chinwendu V, NP   400 mg at 09/20/22 2115   levothyroxine (SYNTHROID) tablet 100 mcg  100 mcg Oral Q0600 Merrily Brittle, DO   100 mcg at 09/28/22 0622   loratadine (CLARITIN) tablet 10 mg  10 mg Oral Daily Rankin, Shuvon B, NP   10 mg at 09/28/22 0913   metFORMIN (GLUCOPHAGE) tablet 500 mg  500 mg Oral Q breakfast France Ravens, MD   500 mg at 09/28/22 0913   nicotine (NICODERM CQ - dosed in mg/24 hours) patch 14 mg  14 mg Transdermal Q0600 Onuoha, Chinwendu V, NP   14 mg at 09/28/22 0914   ondansetron (ZOFRAN-ODT) disintegrating tablet 4 mg  4 mg Oral Q8H PRN Evette Georges, NP   4 mg at 09/26/22 2127   Oxcarbazepine (TRILEPTAL) tablet 300 mg  300 mg Oral BID Onuoha, Chinwendu V, NP   300 mg at 09/28/22 0912   pantoprazole (PROTONIX) EC tablet 20 mg  20 mg Oral Daily Merrily Brittle, DO   20 mg at 09/28/22 0913   polyethylene glycol (MIRALAX / GLYCOLAX) packet 17 g  17 g Oral Daily PRN Ajibola, Ene A, NP   17 g at 09/28/22 0225   QUEtiapine (SEROQUEL) tablet 400 mg  400 mg Oral BID Onuoha, Chinwendu V, NP   400 mg at 09/28/22 0913   sertraline (ZOLOFT) tablet 150 mg  150 mg  Oral Daily Onuoha, Chinwendu V, NP   150 mg at 09/28/22 0913   simethicone (MYLICON) chewable tablet 80 mg  80 mg Oral Q6H PRN Evette Georges, NP   80 mg at 09/25/22 2147   traZODone (DESYREL) tablet 100 mg  100 mg Oral QHS Revonda Humphrey, NP   100 mg at 09/27/22 2123   valACYclovir (VALTREX) tablet 500 mg  500 mg Oral Daily Merrily Brittle, DO   500 mg at 09/28/22 0913   ziprasidone (GEODON) injection 20 mg  20 mg Intramuscular Q12H PRN Onuoha, Chinwendu V, NP   20 mg at 09/19/22 2133   Current Outpatient Medications  Medication  Sig Dispense Refill   ABILIFY MAINTENA 400 MG PRSY prefilled syringe 400 mg every 28 (twenty-eight) days.     cetirizine (ZYRTEC) 10 MG tablet Take 10 mg by mouth daily.     cyclobenzaprine (FLEXERIL) 10 MG tablet Take 1 tablet (10 mg total) by mouth 2 (two) times daily as needed for muscle spasms. 20 tablet 0   fluticasone (FLONASE) 50 MCG/ACT nasal spray Place 1 spray into both nostrils daily.     meloxicam (MOBIC) 15 MG tablet Take 15 mg by mouth daily.     Oxcarbazepine (TRILEPTAL) 300 MG tablet Take 1 tablet (300 mg total) by mouth 2 (two) times daily. 60 tablet 0   QUEtiapine (SEROQUEL) 400 MG tablet Take 1 tablet (400 mg total) by mouth 2 (two) times daily. 60 tablet 0   sertraline (ZOLOFT) 50 MG tablet Take 3 tablets (150 mg total) by mouth in the morning. 90 tablet 0   traZODone (DESYREL) 100 MG tablet Take 1 tablet (100 mg total) by mouth at bedtime. 30 tablet 0   valACYclovir (VALTREX) 500 MG tablet Take 500 mg by mouth daily.      Labs  Lab Results:  Admission on 07/25/2022  Component Date Value Ref Range Status   SARS Coronavirus 2 by RT PCR 07/25/2022 NEGATIVE  NEGATIVE Final   Comment: (NOTE) SARS-CoV-2 target nucleic acids are NOT DETECTED.  The SARS-CoV-2 RNA is generally detectable in upper respiratory specimens during the acute phase of infection. The lowest concentration of SARS-CoV-2 viral copies this assay can detect is 138 copies/mL. A  negative result does not preclude SARS-Cov-2 infection and should not be used as the sole basis for treatment or other patient management decisions. A negative result may occur with  improper specimen collection/handling, submission of specimen other than nasopharyngeal swab, presence of viral mutation(s) within the areas targeted by this assay, and inadequate number of viral copies(<138 copies/mL). A negative result must be combined with clinical observations, patient history, and epidemiological information. The expected result is Negative.  Fact Sheet for Patients:  BloggerCourse.com  Fact Sheet for Healthcare Providers:  SeriousBroker.it  This test is no                          t yet approved or cleared by the [place] FDA and  has been authorized for detection and/or diagnosis of SARS-CoV-2 by FDA under an Emergency Use Authorization (EUA). This EUA will remain  in effect (meaning this test can be used) for the duration of the COVID-19 declaration under Section 564(b)(1) of the Act, 21 U.S.C.section 360bbb-3(b)(1), unless the authorization is terminated  or revoked sooner.       Influenza A by PCR 07/25/2022 NEGATIVE  NEGATIVE Final   Influenza B by PCR 07/25/2022 NEGATIVE  NEGATIVE Final   Comment: (NOTE) The Xpert Xpress SARS-CoV-2/FLU/RSV plus assay is intended as an aid in the diagnosis of influenza from Nasopharyngeal swab specimens and should not be used as a sole basis for treatment. Nasal washings and aspirates are unacceptable for Xpert Xpress SARS-CoV-2/FLU/RSV testing.  Fact Sheet for Patients: BloggerCourse.com  Fact Sheet for Healthcare Providers: SeriousBroker.it  This test is not yet approved or cleared by the [place] FDA and has been authorized for detection and/or diagnosis of SARS-CoV-2 by FDA under an Emergency Use Authorization (EUA). This  EUA will remain in effect (meaning this test can be used) for the duration of the COVID-19 declaration under Section 564(b)(1) of the  Act, 21 U.S.C. section 360bbb-3(b)(1), unless the authorization is terminated or revoked.  Performed at [facility], [place] [address], [place], [place] [address]    WBC 07/25/2022 8.3  4.0 - 10.5 K/uL Final   RBC 07/25/2022 4.44  3.87 - 5.11 MIL/uL Final   Hemoglobin 07/25/2022 13.7  12.0 - 15.0 g/dL Final   HCT 07/25/2022 40.2  36.0 - 46.0 % Final   MCV 07/25/2022 90.5  80.0 - 100.0 fL Final   MCH 07/25/2022 30.9  26.0 - 34.0 pg Final   MCHC 07/25/2022 34.1  30.0 - 36.0 g/dL Final   RDW 07/25/2022 12.2  11.5 - 15.5 % Final   Platelets 07/25/2022 248  150 - 400 K/uL Final   nRBC 07/25/2022 0.0  0.0 - 0.2 % Final   Neutrophils Relative % 07/25/2022 43  % Final   Neutro Abs 07/25/2022 3.6  1.7 - 7.7 K/uL Final   Lymphocytes Relative 07/25/2022 52  % Final   Lymphs Abs 07/25/2022 4.2 (H)  0.7 - 4.0 K/uL Final   Monocytes Relative 07/25/2022 5  % Final   Monocytes Absolute 07/25/2022 0.4  0.1 - 1.0 K/uL Final   Eosinophils Relative 07/25/2022 0  % Final   Eosinophils Absolute 07/25/2022 0.0  0.0 - 0.5 K/uL Final   Basophils Relative 07/25/2022 0  % Final   Basophils Absolute 07/25/2022 0.0  0.0 - 0.1 K/uL Final   Immature Granulocytes 07/25/2022 0  % Final   Abs Immature Granulocytes 07/25/2022 0.02  0.00 - 0.07 K/uL Final   Performed at [facility], [place] [address], [place], [place] [address]   Sodium 07/25/2022 138  135 - 145 mmol/L Final   Potassium 07/25/2022 4.0  3.5 - 5.1 mmol/L Final   Chloride 07/25/2022 104  98 - 111 mmol/L Final   CO2 07/25/2022 29  22 - 32 mmol/L Final   Glucose, Bld 07/25/2022 83  70 - 99 mg/dL Final   Glucose reference range applies only to samples taken after fasting for at least 8 hours.   BUN 07/25/2022 11  6 - 20 mg/dL Final   Creatinine, Ser 07/25/2022 0.97  0.44 - 1.00 mg/dL Final   Calcium 07/25/2022  9.2  8.9 - 10.3 mg/dL Final   Total Protein 07/25/2022 7.0  6.5 - 8.1 g/dL Final   Albumin 07/25/2022 3.8  3.5 - 5.0 g/dL Final   AST 07/25/2022 18  15 - 41 U/L Final   ALT 07/25/2022 22  0 - 44 U/L Final   Alkaline Phosphatase 07/25/2022 64  38 - 126 U/L Final   Total Bilirubin 07/25/2022 0.2 (L)  0.3 - 1.2 mg/dL Final   GFR, Estimated 07/25/2022 >60  >60 mL/min Final   Comment: (NOTE) Calculated using the CKD-EPI Creatinine Equation (2021)    Anion gap 07/25/2022 5  5 - 15 Final   Performed at [place] [address], [place], [place] [address]   POC Amphetamine UR 07/25/2022 None Detected  NONE DETECTED (Cut Off Level 1000 ng/mL) Preliminary   POC Secobarbital (BAR) 07/25/2022 None Detected  NONE DETECTED (Cut Off Level 300 ng/mL) Preliminary   POC Buprenorphine (BUP) 07/25/2022 None Detected  NONE DETECTED (Cut Off Level 10 ng/mL) Preliminary   POC Oxazepam (BZO) 07/25/2022 None Detected  NONE DETECTED (Cut Off Level 300 ng/mL) Preliminary   POC Cocaine UR 07/25/2022 None Detected  NONE DETECTED (Cut Off Level 300 ng/mL) Preliminary   POC Methamphetamine UR 07/25/2022 None Detected  NONE DETECTED (Cut Off Level 1000 ng/mL) Preliminary   POC Morphine 07/25/2022 None Detected  NONE DETECTED (Cut Off Level 300 ng/mL) Preliminary   POC Methadone UR 07/25/2022 None Detected  NONE DETECTED (Cut Off Level 300 ng/mL) Preliminary   POC Oxycodone UR 07/25/2022 None Detected  NONE DETECTED (Cut Off Level 100 ng/mL) Preliminary   POC Marijuana UR 07/25/2022 None Detected  NONE DETECTED (Cut Off Level 50 ng/mL) Preliminary   SARSCOV2ONAVIRUS 2 AG 07/25/2022 NEGATIVE  NEGATIVE Final   Comment: (NOTE) SARS-CoV-2 antigen NOT DETECTED.   Negative results are presumptive.  Negative results do not preclude SARS-CoV-2 infection and should not be used as the sole basis for treatment or other patient management decisions, including infection  control decisions, particularly in the presence of  clinical signs and  symptoms consistent with COVID-19, or in those who have been in contact with the virus.  Negative results must be combined with clinical observations, patient history, and epidemiological information. The expected result is Negative.  Fact Sheet for Patients: https://www.jennings-kim.com/  Fact Sheet for Healthcare Providers: https://alexander-rogers.biz/  This test is not yet approved or cleared by the [place] FDA and  has been authorized for detection and/or diagnosis of SARS-CoV-2 by FDA under an Emergency Use Authorization (EUA).  This EUA will remain in effect (meaning this test can be used) for the duration of  the COV                          ID-19 declaration under Section 564(b)(1) of the Act, 21 U.S.C. section 360bbb-3(b)(1), unless the authorization is terminated or revoked sooner.     Cholesterol 07/25/2022 181  0 - 200 mg/dL Final   Triglycerides 16/07/9603 118  <150 mg/dL Final   HDL 54/06/8118 45  >40 mg/dL Final   Total CHOL/HDL Ratio 07/25/2022 4.0  RATIO Final   VLDL 07/25/2022 24  0 - 40 mg/dL Final   LDL Cholesterol 07/25/2022 112 (H)  0 - 99 mg/dL Final   Comment:        Total Cholesterol/HDL:CHD Risk Coronary Heart Disease Risk Table                     Men   Women  1/2 Average Risk   3.4   3.3  Average Risk       5.0   4.4  2 X Average Risk   9.6   7.1  3 X Average Risk  23.4   11.0        Use the calculated Patient Ratio above and the CHD Risk Table to determine the patient's CHD Risk.        ATP III CLASSIFICATION (LDL):  <100     mg/dL   Optimal  147-829  mg/dL   Near or Above                    Optimal  130-159  mg/dL   Borderline  562-130  mg/dL   High  >865     mg/dL   Very High Performed at [facility], [address], [place], [place] [address]    TSH 07/25/2022 6.668 (H)  0.350 - 4.500 uIU/mL Final   Comment: Performed by a 3rd Generation assay with a functional sensitivity of  <=0.01 uIU/mL. Performed at [facility], [address] [place], [place] [address]    Glucose-Capillary 07/26/2022 104 (H)  70 - 99  mg/dL Final   Glucose reference range applies only to samples taken after fasting for at least 8 hours.   T3, Free 07/31/2022 2.3  2.0 - 4.4 pg/mL Final   Comment: (NOTE) Performed At: [facility] [employer], [place] [id number] Rush Farmer MD [id number]    Free T4 07/31/2022 0.60 (L)  0.61 - 1.12 ng/dL Final   Comment: (NOTE) Biotin ingestion may interfere with free T4 tests. If the results are inconsistent with the TSH level, previous test results, or the clinical presentation, then consider biotin interference. If needed, order repeat testing after stopping biotin. Performed at [facility], [place] [address], [place], [place] [address]    Glucose-Capillary 08/29/2022 100 (H)  70 - 99 mg/dL Final   Glucose reference range applies only to samples taken after fasting for at least 8 hours.   TSH 09/05/2022 0.793  0.350 - 4.500 uIU/mL Final   Comment: Performed by a 3rd Generation assay with a functional sensitivity of <=0.01 uIU/mL. Performed at [facility], [place] [address] [place], [place] [address]    Free T4 09/05/2022 0.73  0.61 - 1.12 ng/dL Final   Comment: (NOTE) Biotin ingestion may interfere with free T4 tests. If the results are inconsistent with the TSH level, previous test results, or the clinical presentation, then consider biotin interference. If needed, order repeat testing after stopping biotin. Performed at [facility], [place] [address], [place], [place] [address]    Preg Test, Ur 09/06/2022 Negative  Negative Final   Preg Test, Ur 09/06/2022 NEGATIVE  NEGATIVE Final   Comment:        THE SENSITIVITY OF THIS METHODOLOGY IS >24 mIU/mL    Preg Test, Ur 09/05/2022 NEGATIVE  NEGATIVE Final   Comment:        THE SENSITIVITY OF THIS METHODOLOGY IS >24 mIU/mL    Sodium 09/13/2022 136   135 - 145 mmol/L Final   Potassium 09/13/2022 4.5  3.5 - 5.1 mmol/L Final   Chloride 09/13/2022 105  98 - 111 mmol/L Final   CO2 09/13/2022 21 (L)  22 - 32 mmol/L Final   Glucose, Bld 09/13/2022 111 (H)  70 - 99 mg/dL Final   Glucose reference range applies only to samples taken after fasting for at least 8 hours.   BUN 09/13/2022 14  6 - 20 mg/dL Final   Creatinine, Ser 09/13/2022 0.92  0.44 - 1.00 mg/dL Final   Calcium 09/13/2022 9.1  8.9 - 10.3 mg/dL Final   GFR, Estimated 09/13/2022 >60  >60 mL/min Final   Comment: (NOTE) Calculated using the CKD-EPI Creatinine Equation (2021)    Anion gap 09/13/2022 10  5 - 15 Final   Performed at [facility], [place] [address] [place], [place] [address]   Vitamin B-12 09/13/2022 585  180 - 914 pg/mL Final   Comment: (NOTE) This assay is not validated for testing neonatal or myeloproliferative syndrome specimens for Vitamin B12 levels. Performed at [facility], [place] [address], [place], [place] [address]    Color, Urine 09/14/2022 YELLOW  YELLOW Final   APPearance 09/14/2022 HAZY (A)  CLEAR Final   Specific Gravity, Urine 09/14/2022 1.025  1.005 - 1.030 Final   pH 09/14/2022 5.0  5.0 - 8.0 Final   Glucose, UA 09/14/2022 NEGATIVE  NEGATIVE mg/dL Final   Hgb urine dipstick 09/14/2022 NEGATIVE  NEGATIVE Final   Bilirubin Urine 09/14/2022 NEGATIVE  NEGATIVE Final   Ketones, ur 09/14/2022  NEGATIVE  NEGATIVE mg/dL Final   Protein, ur 09/14/2022 NEGATIVE  NEGATIVE mg/dL Final   Nitrite 09/14/2022 NEGATIVE  NEGATIVE Final   Leukocytes,Ua 09/14/2022 TRACE (A)  NEGATIVE Final   RBC / HPF 09/14/2022 0-5  0 - 5 RBC/hpf Final   WBC, UA 09/14/2022 0-5  0 - 5 WBC/hpf Final   Bacteria, UA 09/14/2022 RARE (A)  NONE SEEN Final   Squamous Epithelial / LPF 09/14/2022 0-5  0 - 5 Final   Mucus 09/14/2022 PRESENT   Final   Performed at [facility], [place] [address] [place], [place] [address]   Glucose-Capillary 09/16/2022 105 (H)  70 - 99 mg/dL  Final   Glucose reference range applies only to samples taken after fasting for at least 8 hours.  Admission on 07/04/2022, Discharged on 07/05/2022  Component Date Value Ref Range Status   Sodium 07/04/2022 142  135 - 145 mmol/L Final   Potassium 07/04/2022 4.4  3.5 - 5.1 mmol/L Final   Chloride 07/04/2022 109  98 - 111 mmol/L Final   CO2 07/04/2022 26  22 - 32 mmol/L Final   Glucose, Bld 07/04/2022 96  70 - 99 mg/dL Final   Glucose reference range applies only to samples taken after fasting for at least 8 hours.   BUN 07/04/2022 15  6 - 20 mg/dL Final   Creatinine, Ser 07/04/2022 0.83  0.44 - 1.00 mg/dL Final   Calcium 07/04/2022 9.3  8.9 - 10.3 mg/dL Final   Total Protein 07/04/2022 7.2  6.5 - 8.1 g/dL Final   Albumin 07/04/2022 3.9  3.5 - 5.0 g/dL Final   AST 07/04/2022 23  15 - 41 U/L Final   ALT 07/04/2022 28  0 - 44 U/L Final   Alkaline Phosphatase 07/04/2022 77  38 - 126 U/L Final   Total Bilirubin 07/04/2022 0.4  0.3 - 1.2 mg/dL Final   GFR, Estimated 07/04/2022 >60  >60 mL/min Final   Comment: (NOTE) Calculated using the CKD-EPI Creatinine Equation (2021)    Anion gap 07/04/2022 7  5 - 15 Final   Performed at [facility], [place] [address], [place],  [address]   Alcohol, Ethyl (B) 07/04/2022 <10  <10 mg/dL Final   Comment: (NOTE) Lowest detectable limit for serum alcohol is 10 mg/dL.  For medical purposes only. Performed at [facility], [place] [address], [place], [place] [address]    WBC 07/04/2022 7.0  4.0 - 10.5 K/uL Final   RBC 07/04/2022 4.18  3.87 - 5.11 MIL/uL Final   Hemoglobin 07/04/2022 12.8  12.0 - 15.0 g/dL Final   HCT 07/04/2022 39.1  36.0 - 46.0 % Final   MCV 07/04/2022 93.5  80.0 - 100.0 fL Final   MCH 07/04/2022 30.6  26.0 - 34.0 pg Final   MCHC 07/04/2022 32.7  30.0 - 36.0 g/dL Final   RDW 07/04/2022 12.9  11.5 - 15.5 % Final   Platelets 07/04/2022 243  150 - 400 K/uL Final   nRBC 07/04/2022 0.0  0.0 - 0.2 %  Final   Neutrophils Relative % 07/04/2022 43  % Final   Neutro Abs 07/04/2022 3.0  1.7 - 7.7 K/uL Final   Lymphocytes Relative 07/04/2022 50  % Final   Lymphs Abs 07/04/2022 3.5  0.7 - 4.0 K/uL Final   Monocytes Relative 07/04/2022 7  % Final   Monocytes Absolute 07/04/2022 0.5  0.1 - 1.0 K/uL Final   Eosinophils Relative 07/04/2022 0  % Final  Eosinophils Absolute 07/04/2022 0.0  0.0 - 0.5 K/uL Final   Basophils Relative 07/04/2022 0  % Final   Basophils Absolute 07/04/2022 0.0  0.0 - 0.1 K/uL Final   Immature Granulocytes 07/04/2022 0  % Final   Abs Immature Granulocytes 07/04/2022 0.01  0.00 - 0.07 K/uL Final   Performed at [facility], [place] [address], [place], [place] [address]   I-stat hCG, quantitative 07/04/2022 <5.0  <5 mIU/mL Final   Comment 3 07/04/2022          Final   Comment:   GEST. AGE      CONC.  (mIU/mL)   <=1 WEEK        5 - 50     2 WEEKS       50 - 500     3 WEEKS       100 - 10,000     4 WEEKS     1,000 - 30,000        FEMALE AND NON-PREGNANT FEMALE:     LESS THAN 5 mIU/mL   Admission on 06/14/2022, Discharged on 06/14/2022  Component Date Value Ref Range Status   Color, UA 06/14/2022 yellow  yellow Final   Clarity, UA 06/14/2022 cloudy (A)  clear Final   Glucose, UA 06/14/2022 negative  negative mg/dL Final   Bilirubin, UA 06/14/2022 negative  negative Final   Ketones, POC UA 06/14/2022 negative  negative mg/dL Final   Spec Grav, UA 06/14/2022 1.025  1.010 - 1.025 Final   Blood, UA 06/14/2022 negative  negative Final   pH, UA 06/14/2022 7.5  5.0 - 8.0 Final   Protein Ur, POC 06/14/2022 =30 (A)  negative mg/dL Final   Urobilinogen, UA 06/14/2022 0.2  0.2 or 1.0 E.U./dL Final   Nitrite, UA 06/14/2022 Negative  Negative Final   Leukocytes, UA 06/14/2022 Negative  Negative Final   Preg Test, Ur 06/14/2022 Negative  Negative Final   Specimen Description 06/14/2022 URINE, CLEAN CATCH   Final   Special Requests 06/14/2022    Final                    Value:NONE Performed at [facility], [place] [address], [place], [place] [address]    Culture 06/14/2022 MULTIPLE SPECIES PRESENT, SUGGEST RECOLLECTION (A)   Final   Report Status 06/14/2022 06/16/2022 FINAL   Final  Admission on 06/07/2022, Discharged on 06/10/2022  Component Date Value Ref Range Status   SARS Coronavirus 2 by RT PCR 06/07/2022 NEGATIVE  NEGATIVE Final   Comment: (NOTE) SARS-CoV-2 target nucleic acids are NOT DETECTED.  The SARS-CoV-2 RNA is generally detectable in upper respiratory specimens during the acute phase of infection. The lowest concentration of SARS-CoV-2 viral copies this assay can detect is 138 copies/mL. A negative result does not preclude SARS-Cov-2 infection and should not be used as the sole basis for treatment or other patient management decisions. A negative result may occur with  improper specimen collection/handling, submission of specimen other than nasopharyngeal swab, presence of viral mutation(s) within the areas targeted by this assay, and inadequate number of viral copies(<138 copies/mL). A negative result must be combined with clinical observations, patient history, and epidemiological information. The expected result is Negative.  Fact Sheet for Patients:  EntrepreneurPulse.com.au  Fact Sheet for Healthcare Providers:  IncredibleEmployment.be  This test is no  t yet approved or cleared by the [place] FDA and  has been authorized for detection and/or diagnosis of SARS-CoV-2 by FDA under an Emergency Use Authorization (EUA). This EUA will remain  in effect (meaning this test can be used) for the duration of the COVID-19 declaration under Section 564(b)(1) of the Act, 21 U.S.C.section 360bbb-3(b)(1), unless the authorization is terminated  or revoked sooner.       Influenza A by PCR 06/07/2022 NEGATIVE  NEGATIVE Final   Influenza B by PCR 06/07/2022  NEGATIVE  NEGATIVE Final   Comment: (NOTE) The Xpert Xpress SARS-CoV-2/FLU/RSV plus assay is intended as an aid in the diagnosis of influenza from Nasopharyngeal swab specimens and should not be used as a sole basis for treatment. Nasal washings and aspirates are unacceptable for Xpert Xpress SARS-CoV-2/FLU/RSV testing.  Fact Sheet for Patients: BloggerCourse.comhttps://www.fda.gov/media/152166/download  Fact Sheet for Healthcare Providers: SeriousBroker.ithttps://www.fda.gov/media/152162/download  This test is not yet approved or cleared by the [place] FDA and has been authorized for detection and/or diagnosis of SARS-CoV-2 by FDA under an Emergency Use Authorization (EUA). This EUA will remain in effect (meaning this test can be used) for the duration of the COVID-19 declaration under Section 564(b)(1) of the Act, 21 U.S.C. section 360bbb-3(b)(1), unless the authorization is terminated or revoked.  Performed at [facility], [address] [place], [place] [address]    WBC 06/07/2022 5.7  4.0 - 10.5 K/uL Final   RBC 06/07/2022 4.39  3.87 - 5.11 MIL/uL Final   Hemoglobin 06/07/2022 13.2  12.0 - 15.0 g/dL Final   HCT 60/45/409808/25/2023 40.4  36.0 - 46.0 % Final   MCV 06/07/2022 92.0  80.0 - 100.0 fL Final   MCH 06/07/2022 30.1  26.0 - 34.0 pg Final   MCHC 06/07/2022 32.7  30.0 - 36.0 g/dL Final   RDW 11/91/478208/25/2023 12.4  11.5 - 15.5 % Final   Platelets 06/07/2022 308  150 - 400 K/uL Final   nRBC 06/07/2022 0.0  0.0 - 0.2 % Final   Neutrophils Relative % 06/07/2022 42  % Final   Neutro Abs 06/07/2022 2.4  1.7 - 7.7 K/uL Final   Lymphocytes Relative 06/07/2022 54  % Final   Lymphs Abs 06/07/2022 3.1  0.7 - 4.0 K/uL Final   Monocytes Relative 06/07/2022 4  % Final   Monocytes Absolute 06/07/2022 0.2  0.1 - 1.0 K/uL Final   Eosinophils Relative 06/07/2022 0  % Final   Eosinophils Absolute 06/07/2022 0.0  0.0 - 0.5 K/uL Final   Basophils Relative 06/07/2022 0  % Final   Basophils Absolute 06/07/2022 0.0  0.0  - 0.1 K/uL Final   Immature Granulocytes 06/07/2022 0  % Final   Abs Immature Granulocytes 06/07/2022 0.01  0.00 - 0.07 K/uL Final   Performed at [facility], [address] [place], [place] [address]   Sodium 06/07/2022 139  135 - 145 mmol/L Final   Potassium 06/07/2022 4.0  3.5 - 5.1 mmol/L Final   Chloride 06/07/2022 104  98 - 111 mmol/L Final   CO2 06/07/2022 28  22 - 32 mmol/L Final   Glucose, Bld 06/07/2022 104 (H)  70 - 99 mg/dL Final   Glucose reference range applies only to samples taken after fasting for at least 8 hours.   BUN 06/07/2022 8  6 - 20 mg/dL Final   Creatinine, Ser 06/07/2022 0.84  0.44 - 1.00 mg/dL Final   Calcium 30/86/578408/25/2023 9.1  8.9 - 10.3 mg/dL Final  Total Protein 06/07/2022 6.9  6.5 - 8.1 g/dL Final   Albumin 06/07/2022 3.7  3.5 - 5.0 g/dL Final   AST 06/07/2022 19  15 - 41 U/L Final   ALT 06/07/2022 22  0 - 44 U/L Final   Alkaline Phosphatase 06/07/2022 54  38 - 126 U/L Final   Total Bilirubin 06/07/2022 0.4  0.3 - 1.2 mg/dL Final   GFR, Estimated 06/07/2022 >60  >60 mL/min Final   Comment: (NOTE) Calculated using the CKD-EPI Creatinine Equation (2021)    Anion gap 06/07/2022 7  5 - 15 Final   Performed at [facility], [place] [address] [place], [place] [address]   Hgb A1c MFr Bld 06/07/2022 5.0  4.8 - 5.6 % Final   Comment: (NOTE) Pre diabetes:          5.7%-6.4%  Diabetes:              >6.4%  Glycemic control for   <7.0% adults with diabetes    Mean Plasma Glucose 06/07/2022 96.8  mg/dL Final   Performed at [facility], [place] [address] [place], [place] [address]   TSH 06/07/2022 1.620  0.350 - 4.500 uIU/mL Final   Comment: Performed by a 3rd Generation assay with a functional sensitivity of <=0.01 uIU/mL. Performed at [facility], [place] [address], [place], [place] [address]    RPR Ser Ql 06/07/2022 NON REACTIVE  NON REACTIVE Final   Performed at [facility], [place] [address], [place], [place] [address]   Color,  Urine 06/07/2022 YELLOW  YELLOW Final   APPearance 06/07/2022 HAZY (A)  CLEAR Final   Specific Gravity, Urine 06/07/2022 1.018  1.005 - 1.030 Final   pH 06/07/2022 7.0  5.0 - 8.0 Final   Glucose, UA 06/07/2022 NEGATIVE  NEGATIVE mg/dL Final   Hgb urine dipstick 06/07/2022 NEGATIVE  NEGATIVE Final   Bilirubin Urine 06/07/2022 NEGATIVE  NEGATIVE Final   Ketones, ur 06/07/2022 NEGATIVE  NEGATIVE mg/dL Final   Protein, ur 06/07/2022 NEGATIVE  NEGATIVE mg/dL Final   Nitrite 06/07/2022 NEGATIVE  NEGATIVE Final   Leukocytes,Ua 06/07/2022 NEGATIVE  NEGATIVE Final   Performed at [facility], [place] [address] [place], [place] [address]   Cholesterol 06/07/2022 164  0 - 200 mg/dL Final   Triglycerides 06/07/2022 158 (H)  <150 mg/dL Final   HDL 06/07/2022 44  >40 mg/dL Final   Total CHOL/HDL Ratio 06/07/2022 3.7  RATIO Final   VLDL 06/07/2022 32  0 - 40 mg/dL Final   LDL Cholesterol 06/07/2022 88  0 - 99 mg/dL Final   Comment:        Total Cholesterol/HDL:CHD Risk Coronary Heart Disease Risk Table                     Men   Women  1/2 Average Risk   3.4   3.3  Average Risk       5.0   4.4  2 X Average Risk   9.6   7.1  3 X Average Risk  23.4   11.0        Use the calculated Patient Ratio above and the CHD Risk Table to determine the patient's CHD Risk.        ATP III CLASSIFICATION (LDL):  <100     mg/dL   Optimal  100-129  mg/dL   Near or Above  Optimal  130-159  mg/dL   Borderline  119-147  mg/dL   High  >829     mg/dL   Very High Performed at [facility], [address], [place], [place] [address]    HIV Screen 4th Generation wRfx 06/07/2022 Non Reactive  Non Reactive Final   Performed at [facility], [address] [place], [place] [address]   SARSCOV2ONAVIRUS 2 AG 06/07/2022 NEGATIVE  NEGATIVE Final   Comment: (NOTE) SARS-CoV-2 antigen NOT DETECTED.   Negative results are presumptive.  Negative results do not preclude SARS-CoV-2 infection  and should not be used as the sole basis for treatment or other patient management decisions, including infection  control decisions, particularly in the presence of clinical signs and  symptoms consistent with COVID-19, or in those who have been in contact with the virus.  Negative results must be combined with clinical observations, patient history, and epidemiological information. The expected result is Negative.  Fact Sheet for Patients: https://www.jennings-kim.com/  Fact Sheet for Healthcare Providers: https://alexander-rogers.biz/  This test is not yet approved or cleared by the [place] FDA and  has been authorized for detection and/or diagnosis of SARS-CoV-2 by FDA under an Emergency Use Authorization (EUA).  This EUA will remain in effect (meaning this test can be used) for the duration of  the COV                          ID-19 declaration under Section 564(b)(1) of the Act, 21 U.S.C. section 360bbb-3(b)(1), unless the authorization is terminated or revoked sooner.     POC Amphetamine UR 06/07/2022 None Detected  NONE DETECTED (Cut Off Level 1000 ng/mL) Final   POC Secobarbital (BAR) 06/07/2022 None Detected  NONE DETECTED (Cut Off Level 300 ng/mL) Final   POC Buprenorphine (BUP) 06/07/2022 None Detected  NONE DETECTED (Cut Off Level 10 ng/mL) Final   POC Oxazepam (BZO) 06/07/2022 None Detected  NONE DETECTED (Cut Off Level 300 ng/mL) Final   POC Cocaine UR 06/07/2022 None Detected  NONE DETECTED (Cut Off Level 300 ng/mL) Final   POC Methamphetamine UR 06/07/2022 None Detected  NONE DETECTED (Cut Off Level 1000 ng/mL) Final   POC Morphine 06/07/2022 None Detected  NONE DETECTED (Cut Off Level 300 ng/mL) Final   POC Methadone UR 06/07/2022 None Detected  NONE DETECTED (Cut Off Level 300 ng/mL) Final   POC Oxycodone UR 06/07/2022 None Detected  NONE DETECTED (Cut Off Level 100 ng/mL) Final   POC Marijuana UR 06/07/2022 None Detected  NONE DETECTED  (Cut Off Level 50 ng/mL) Final   Preg Test, Ur 06/08/2022 NEGATIVE  NEGATIVE Final   Comment:        THE SENSITIVITY OF THIS METHODOLOGY IS >20 mIU/mL. Performed at [facility], [address] [place], [place] [address]    Preg Test, Ur 06/08/2022 NEGATIVE  NEGATIVE Final   Comment:        THE SENSITIVITY OF THIS METHODOLOGY IS >24 mIU/mL     Blood Alcohol level:  Lab Results  Component Value Date   ETH <10 07/04/2022   ETH <10 02/07/2022    Metabolic Disorder Labs: Lab Results  Component Value Date   HGBA1C 5.0 06/07/2022   MPG 96.8 06/07/2022   MPG 96.8 02/07/2022   No results found for: "PROLACTIN" Lab Results  Component Value Date   CHOL 181 07/25/2022   TRIG 118 07/25/2022   HDL 45 07/25/2022  CHOLHDL 4.0 07/25/2022   VLDL 24 07/25/2022   LDLCALC 112 (H) 07/25/2022   LDLCALC 88 06/07/2022    Therapeutic Lab Levels: No results found for: "LITHIUM" No results found for: "VALPROATE" No results found for: "CBMZ"  Physical Findings   GAD-7    Flowsheet Row Office Visit from 12/17/2021 in [facility]  Total GAD-7 Score 17      PHQ2-9    [place] [place] [facility] from 06/07/2022 in [facility] Office Visit from 12/17/2021 in [place]  PHQ-2 Total Score 1 2  PHQ-9 Total Score 2 8      Flowsheet Row ED from 07/25/2022 in [facility] ED from 07/04/2022 in [place] DEPT ED from 06/14/2022 in [facility] No Risk No Risk No Risk        Musculoskeletal  Strength & Muscle Tone: within normal limits Gait & Station: normal Patient leans: N/A  Psychiatric Specialty Exam  Presentation  General Appearance:  Appropriate for Environment  Eye Contact: Fair  Speech: Clear and Coherent  Speech Volume: Normal  Handedness: Right   Mood and Affect   Mood: Euthymic  Affect: Congruent   Thought Process  Thought Processes: Coherent  Descriptions of Associations:Intact  Orientation:Full (Time, Place and Person)  Thought Content:Logical  Diagnosis of Schizophrenia or Schizoaffective disorder in past: Yes  Duration of Psychotic Symptoms: Greater than six months   Hallucinations:Hallucinations: None  Ideas of Reference:None  Suicidal Thoughts:Suicidal Thoughts: No  Homicidal Thoughts:Homicidal Thoughts: No   Sensorium  Memory: Immediate Fair; Recent Fair; Remote Fair  Judgment: Intact  Insight: Present   Executive Functions  Concentration: Fair  Attention Span: Fair  Recall: [employer] of Knowledge: Fair  Language: Fair   Psychomotor Activity  Psychomotor Activity: Psychomotor Activity: Normal   Assets  Assets: Communication Skills; Desire for Improvement; Physical Health   Sleep  Sleep: Sleep: Fair   Physical Exam  Physical Exam HENT:     Head: Normocephalic.     Nose: Nose normal.  Eyes:     Conjunctiva/sclera: Conjunctivae normal.  Cardiovascular:     Rate and Rhythm: Normal rate.  Pulmonary:     Effort: Pulmonary effort is normal.  Musculoskeletal:        General: Normal range of motion.     Cervical back: Normal range of motion.  Neurological:     Mental Status: She is alert and oriented to person, place, and time.    Review of Systems  Constitutional: Negative.   HENT: Negative.    Eyes: Negative.   Respiratory: Negative.    Cardiovascular: Negative.   Gastrointestinal: Negative.   Genitourinary: Negative.   Musculoskeletal: Negative.   Neurological: Negative.   Endo/Heme/Allergies: Negative.    Blood pressure 102/75, pulse 94, temperature 98.1 F (36.7 C), resp. rate 16, SpO2 100 %. There is no height or weight on file to calculate BMI.  Treatment Plan Summary: Disposition-Patient remains cleared by psychiatry. Transition of care team is pursuing  appropriate placement.    Labs reviewed.  No new lab results.    Vital signs reviewed, and are WNL.   Medication reviewed, no new medication changes on 09/28/22.  ARIPiprazole ER  400 mg Intramuscular Q28 days   docusate sodium  100 mg Oral Daily   levothyroxine  100 mcg Oral Q0600   loratadine  10 mg Oral Daily  metFORMIN  500 mg Oral Q breakfast   nicotine  14 mg Transdermal Q0600   Oxcarbazepine  300 mg Oral BID   pantoprazole  20 mg Oral Daily   QUEtiapine  400 mg Oral BID   sertraline  150 mg Oral Daily   traZODone  100 mg Oral QHS   valACYclovir  500 mg Oral Daily   Marissa Calamity, NP 09/28/2022 12:17 PM

## 2022-09-28 NOTE — ED Notes (Signed)
Pt has been pleasant and cooperative. Denies any new acute concerns, does express constipation, encouraged po fluids and am stool softner given . Pt denies any SI , HI or AVH. Pt is safe, able to make needs known.

## 2022-09-29 MED ORDER — ARIPIPRAZOLE ER 400 MG IM SRER
400.0000 mg | INTRAMUSCULAR | Status: DC
  Filled 2022-09-29: qty 2

## 2022-09-29 NOTE — ED Notes (Signed)
Pt offered injection of abilify and patient reports '' I already had my shot, I'm sure I took it. '' NP notified as pt requests to hold dose.

## 2022-09-29 NOTE — ED Notes (Signed)
Pt sleeping at present, no distress noted.  Monitoring for safety. 

## 2022-09-29 NOTE — ED Notes (Signed)
Pt A&O x 4, no distress noted, monitoring for safety.

## 2022-09-29 NOTE — ED Provider Notes (Addendum)
Behavioral Health Progress Note  Date and Time: 09/29/2022 11:12 AM Name: Nicole [name] MRN:  [id number]    Per HPI: Nicole [name] is a 31 year old female with a past psychiatric history significant for schizophrenia and major depressive disorder with psychosis who voluntarily presented to [facility] on 07/25/2022 via [facility] after a verbal altercation with [name] (not patient's legal guardian) for transient SI. This is patient's fourth visit to [facility] for similar concerns this year. Patient has been dismissed from previous group home due to threatening suicidal and homicidal ideations, then leaving the group home. Legal Guardian: [name]) transitioning to be [name])  Subjective:  Patient seen and evaluated face-to-face by this provider, and chart reviewed. On evaluation, patient is observed sitting the side of the bed watching television. She is alert and oriented x 4. Her thought process is linear, and speech is clear and coherent at a moderate tone. Her mood is euthymic, and affect is congruent. She has fair eye contact. She denies SI/HI/AVH. There is no objective evidence that the patient is currently responding to internal or external stimuli. She denies depressive symptoms. She reports good sleep. She reports a good appetite. She is compliant with taking scheduled medications and denies medication side effects currently. She denies physical complaints.   Diagnosis:  Final diagnoses:  Severe episode of recurrent major depressive disorder, with psychotic features (HCC)    Total Time spent with patient: 15 minutes  Past Psychiatric History:  History of schizoaffective, borderline IDD, anxiety, and MDD.       Past Medical History:  Past Medical History:  Diagnosis Date   Anxiety    Depression    Hypothyroidism 08/07/2022   Tobacco use disorder 08/07/2022    Past Surgical History:  Procedure Laterality Date   WISDOM TOOTH EXTRACTION Bilateral 2020   Family  History:  Family History  Problem Relation Age of Onset   Hypertension Father    Diabetes Father    Family Psychiatric  History: No history reported.  Social History:  Social History   Substance and Sexual Activity  Alcohol Use Never     Social History   Substance and Sexual Activity  Drug Use Never    Social History   Socioeconomic History   Marital status: Single    Spouse name: Not on file   Number of children: Not on file   Years of education: Not on file   Highest education level: Not on file  Occupational History   Not on file  Tobacco Use   Smoking status: Every Day    Types: Cigarettes   Smokeless tobacco: Never  Vaping Use   Vaping Use: Some days  Substance and Sexual Activity   Alcohol use: Never   Drug use: Never   Sexual activity: Yes    Partners: Female    Birth control/protection: Implant  Other Topics Concern   Not on file  Social History Narrative   Not on file   Social Determinants of Health   Financial Resource Strain: Not on file  Food Insecurity: Not on file  Transportation Needs: Not on file  Physical Activity: Not on file  Stress: Not on file  Social Connections: Not on file   SDOH:  SDOH Screenings   Depression (PHQ2-9): Low Risk  (06/10/2022)  Tobacco Use: High Risk (09/24/2022)   Additional Social History:    Pain Medications: SEE MAR Prescriptions: SEE MAR Over the Counter: SEE MAR History of alcohol / drug use?: Yes Longest period of  sobriety (when/how long): N/A Negative Consequences of Use:  (NONE) Withdrawal Symptoms: None Name of Substance 1: ETOH IN PAST--PT CURRENTLY DENIES 1 - Frequency: SOCIAL USE ETOH IN PAST 1 - Method of Aquiring: LEGAL 1- Route of Use: ORAL DRINK Name of Substance 2: NICOTINE-VAPE AND CIGARETTES 2 - Amount (size/oz): LESS THAN ONE PACK 2 - Frequency: DAILY 2 - Method of Aquiring: LEGAL 2 - Route of Substance Use: ORAL SMOKE     Current Facility-Administered Medications  Medication  Dose Route Frequency Provider Last Rate Last Admin   acetaminophen (TYLENOL) tablet 650 mg  650 mg Oral Q6H PRN Onuoha, Chinwendu V, NP   650 mg at 09/21/22 2200   ARIPiprazole ER (ABILIFY MAINTENA) injection 400 mg  400 mg Intramuscular Q28 days Princess Bruins, DO   400 mg at 09/27/22 1445   docusate sodium (COLACE) capsule 100 mg  100 mg Oral Daily [name], MD   100 mg at 09/29/22 0849   hydrOXYzine (ATARAX) tablet 25 mg  25 mg Oral TID PRN Onuoha, Chinwendu V, NP   25 mg at 09/28/22 2124   ibuprofen (ADVIL) tablet 400 mg  400 mg Oral Q6H PRN Onuoha, Chinwendu V, NP   400 mg at 09/20/22 2115   levothyroxine (SYNTHROID) tablet 100 mcg  100 mcg Oral Q0600 Princess Bruins, DO   100 mcg at 09/29/22 5409   loratadine (CLARITIN) tablet 10 mg  10 mg Oral Daily Rankin, Shuvon B, NP   10 mg at 09/29/22 0849   metFORMIN (GLUCOPHAGE) tablet 500 mg  500 mg Oral Q breakfast Park Pope, MD   500 mg at 09/29/22 0849   nicotine (NICODERM CQ - dosed in mg/24 hours) patch 14 mg  14 mg Transdermal Q0600 Onuoha, Chinwendu V, NP   14 mg at 09/29/22 0850   ondansetron (ZOFRAN-ODT) disintegrating tablet 4 mg  4 mg Oral Q8H PRN Sindy Guadeloupe, NP   4 mg at 09/26/22 2127   Oxcarbazepine (TRILEPTAL) tablet 300 mg  300 mg Oral BID Onuoha, Chinwendu V, NP   300 mg at 09/29/22 0849   pantoprazole (PROTONIX) EC tablet 20 mg  20 mg Oral Daily Princess Bruins, DO   20 mg at 09/29/22 0849   polyethylene glycol (MIRALAX / GLYCOLAX) packet 17 g  17 g Oral Daily PRN Ajibola, Ene A, NP   17 g at 09/28/22 0225   QUEtiapine (SEROQUEL) tablet 400 mg  400 mg Oral BID Onuoha, Chinwendu V, NP   400 mg at 09/29/22 0849   sertraline (ZOLOFT) tablet 150 mg  150 mg Oral Daily Onuoha, Chinwendu V, NP   150 mg at 09/29/22 0849   simethicone (MYLICON) chewable tablet 80 mg  80 mg Oral Q6H PRN Sindy Guadeloupe, NP   80 mg at 09/25/22 2147   traZODone (DESYREL) tablet 100 mg  100 mg Oral QHS Ardis Hughs, NP   100 mg at 09/28/22 2124    valACYclovir (VALTREX) tablet 500 mg  500 mg Oral Daily Princess Bruins, DO   500 mg at 09/29/22 0849   ziprasidone (GEODON) injection 20 mg  20 mg Intramuscular Q12H PRN Onuoha, Chinwendu V, NP   20 mg at 09/19/22 2133   Current Outpatient Medications  Medication Sig Dispense Refill   ABILIFY MAINTENA 400 MG PRSY prefilled syringe 400 mg every 28 (twenty-eight) days.     cetirizine (ZYRTEC) 10 MG tablet Take 10 mg by mouth daily.     cyclobenzaprine (FLEXERIL) 10 MG tablet Take 1 tablet (10  mg total) by mouth 2 (two) times daily as needed for muscle spasms. 20 tablet 0   fluticasone (FLONASE) 50 MCG/ACT nasal spray Place 1 spray into both nostrils daily.     meloxicam (MOBIC) 15 MG tablet Take 15 mg by mouth daily.     Oxcarbazepine (TRILEPTAL) 300 MG tablet Take 1 tablet (300 mg total) by mouth 2 (two) times daily. 60 tablet 0   QUEtiapine (SEROQUEL) 400 MG tablet Take 1 tablet (400 mg total) by mouth 2 (two) times daily. 60 tablet 0   sertraline (ZOLOFT) 50 MG tablet Take 3 tablets (150 mg total) by mouth in the morning. 90 tablet 0   traZODone (DESYREL) 100 MG tablet Take 1 tablet (100 mg total) by mouth at bedtime. 30 tablet 0   valACYclovir (VALTREX) 500 MG tablet Take 500 mg by mouth daily.      Labs  Lab Results:  Admission on 07/25/2022  Component Date Value Ref Range Status   SARS Coronavirus 2 by RT PCR 07/25/2022 NEGATIVE  NEGATIVE Final   Comment: (NOTE) SARS-CoV-2 target nucleic acids are NOT DETECTED.  The SARS-CoV-2 RNA is generally detectable in upper respiratory specimens during the acute phase of infection. The lowest concentration of SARS-CoV-2 viral copies this assay can detect is 138 copies/mL. A negative result does not preclude SARS-Cov-2 infection and should not be used as the sole basis for treatment or other patient management decisions. A negative result may occur with  improper specimen collection/handling, submission of specimen other than nasopharyngeal  swab, presence of viral mutation(s) within the areas targeted by this assay, and inadequate number of viral copies(<138 copies/mL). A negative result must be combined with clinical observations, patient history, and epidemiological information. The expected result is Negative.  Fact Sheet for Patients:  BloggerCourse.com  Fact Sheet for Healthcare Providers:  SeriousBroker.it  This test is no                          t yet approved or cleared by the [place] FDA and  has been authorized for detection and/or diagnosis of SARS-CoV-2 by FDA under an Emergency Use Authorization (EUA). This EUA will remain  in effect (meaning this test can be used) for the duration of the COVID-19 declaration under Section 564(b)(1) of the Act, 21 U.S.C.section 360bbb-3(b)(1), unless the authorization is terminated  or revoked sooner.       Influenza A by PCR 07/25/2022 NEGATIVE  NEGATIVE Final   Influenza B by PCR 07/25/2022 NEGATIVE  NEGATIVE Final   Comment: (NOTE) The Xpert Xpress SARS-CoV-2/FLU/RSV plus assay is intended as an aid in the diagnosis of influenza from Nasopharyngeal swab specimens and should not be used as a sole basis for treatment. Nasal washings and aspirates are unacceptable for Xpert Xpress SARS-CoV-2/FLU/RSV testing.  Fact Sheet for Patients: BloggerCourse.com  Fact Sheet for Healthcare Providers: SeriousBroker.it  This test is not yet approved or cleared by the [place] FDA and has been authorized for detection and/or diagnosis of SARS-CoV-2 by FDA under an Emergency Use Authorization (EUA). This EUA will remain in effect (meaning this test can be used) for the duration of the COVID-19 declaration under Section 564(b)(1) of the Act, 21 U.S.C. section 360bbb-3(b)(1), unless the authorization is terminated or revoked.  Performed at [facility], [address] [place], [place] [address]    WBC 07/25/2022 8.3  4.0 - 10.5 K/uL Final   RBC 07/25/2022 4.44  3.87 -  5.11 MIL/uL Final   Hemoglobin 07/25/2022 13.7  12.0 - 15.0 g/dL Final   HCT 81/19/147810/09/2022 40.2  36.0 - 46.0 % Final   MCV 07/25/2022 90.5  80.0 - 100.0 fL Final   MCH 07/25/2022 30.9  26.0 - 34.0 pg Final   MCHC 07/25/2022 34.1  30.0 - 36.0 g/dL Final   RDW 29/56/213010/09/2022 12.2  11.5 - 15.5 % Final   Platelets 07/25/2022 248  150 - 400 K/uL Final   nRBC 07/25/2022 0.0  0.0 - 0.2 % Final   Neutrophils Relative % 07/25/2022 43  % Final   Neutro Abs 07/25/2022 3.6  1.7 - 7.7 K/uL Final   Lymphocytes Relative 07/25/2022 52  % Final   Lymphs Abs 07/25/2022 4.2 (H)  0.7 - 4.0 K/uL Final   Monocytes Relative 07/25/2022 5  % Final   Monocytes Absolute 07/25/2022 0.4  0.1 - 1.0 K/uL Final   Eosinophils Relative 07/25/2022 0  % Final   Eosinophils Absolute 07/25/2022 0.0  0.0 - 0.5 K/uL Final   Basophils Relative 07/25/2022 0  % Final   Basophils Absolute 07/25/2022 0.0  0.0 - 0.1 K/uL Final   Immature Granulocytes 07/25/2022 0  % Final   Abs Immature Granulocytes 07/25/2022 0.02  0.00 - 0.07 K/uL Final   Performed at [facility], [address] [place], [place] [address]   Sodium 07/25/2022 138  135 - 145 mmol/L Final   Potassium 07/25/2022 4.0  3.5 - 5.1 mmol/L Final   Chloride 07/25/2022 104  98 - 111 mmol/L Final   CO2 07/25/2022 29  22 - 32 mmol/L Final   Glucose, Bld 07/25/2022 83  70 - 99 mg/dL Final   Glucose reference range applies only to samples taken after fasting for at least 8 hours.   BUN 07/25/2022 11  6 - 20 mg/dL Final   Creatinine, Ser 07/25/2022 0.97  0.44 - 1.00 mg/dL Final   Calcium 46/96/295210/09/2022 9.2  8.9 - 10.3 mg/dL Final   Total Protein 84/13/244010/09/2022 7.0  6.5 - 8.1 g/dL Final   Albumin 10/27/253610/09/2022 3.8  3.5 - 5.0 g/dL Final   AST 64/40/347410/09/2022 18  15 - 41 U/L Final   ALT 07/25/2022 22  0 - 44 U/L Final   Alkaline Phosphatase 07/25/2022 64  38 - 126 U/L Final   Total  Bilirubin 07/25/2022 0.2 (L)  0.3 - 1.2 mg/dL Final   GFR, Estimated 07/25/2022 >60  >60 mL/min Final   Comment: (NOTE) Calculated using the CKD-EPI Creatinine Equation (2021)    Anion gap 07/25/2022 5  5 - 15 Final   Performed at [facility], [address] [place], [place] [address]   POC Amphetamine UR 07/25/2022 None Detected  NONE DETECTED (Cut Off Level 1000 ng/mL) Preliminary   POC Secobarbital (BAR) 07/25/2022 None Detected  NONE DETECTED (Cut Off Level 300 ng/mL) Preliminary   POC Buprenorphine (BUP) 07/25/2022 None Detected  NONE DETECTED (Cut Off Level 10 ng/mL) Preliminary   POC Oxazepam (BZO) 07/25/2022 None Detected  NONE DETECTED (Cut Off Level 300 ng/mL) Preliminary   POC Cocaine UR 07/25/2022 None Detected  NONE DETECTED (Cut Off Level 300 ng/mL) Preliminary   POC Methamphetamine UR 07/25/2022 None Detected  NONE DETECTED (Cut Off Level 1000 ng/mL) Preliminary   POC Morphine 07/25/2022 None Detected  NONE DETECTED (Cut Off Level 300 ng/mL) Preliminary   POC Methadone UR 07/25/2022 None Detected  NONE DETECTED (Cut Off Level 300 ng/mL) Preliminary   POC Oxycodone  UR 07/25/2022 None Detected  NONE DETECTED (Cut Off Level 100 ng/mL) Preliminary   POC Marijuana UR 07/25/2022 None Detected  NONE DETECTED (Cut Off Level 50 ng/mL) Preliminary   SARSCOV2ONAVIRUS 2 AG 07/25/2022 NEGATIVE  NEGATIVE Final   Comment: (NOTE) SARS-CoV-2 antigen NOT DETECTED.   Negative results are presumptive.  Negative results do not preclude SARS-CoV-2 infection and should not be used as the sole basis for treatment or other patient management decisions, including infection  control decisions, particularly in the presence of clinical signs and  symptoms consistent with COVID-19, or in those who have been in contact with the virus.  Negative results must be combined with clinical observations, patient history, and epidemiological information. The expected result is Negative.  Fact Sheet for  Patients: https://www.jennings-kim.com/  Fact Sheet for Healthcare Providers: https://alexander-rogers.biz/  This test is not yet approved or cleared by the [place] FDA and  has been authorized for detection and/or diagnosis of SARS-CoV-2 by FDA under an Emergency Use Authorization (EUA).  This EUA will remain in effect (meaning this test can be used) for the duration of  the COV                          ID-19 declaration under Section 564(b)(1) of the Act, 21 U.S.C. section 360bbb-3(b)(1), unless the authorization is terminated or revoked sooner.     Cholesterol 07/25/2022 181  0 - 200 mg/dL Final   Triglycerides 29/56/2130 118  <150 mg/dL Final   HDL 86/57/8469 45  >40 mg/dL Final   Total CHOL/HDL Ratio 07/25/2022 4.0  RATIO Final   VLDL 07/25/2022 24  0 - 40 mg/dL Final   LDL Cholesterol 07/25/2022 112 (H)  0 - 99 mg/dL Final   Comment:        Total Cholesterol/HDL:CHD Risk Coronary Heart Disease Risk Table                     Men   Women  1/2 Average Risk   3.4   3.3  Average Risk       5.0   4.4  2 X Average Risk   9.6   7.1  3 X Average Risk  23.4   11.0        Use the calculated Patient Ratio above and the CHD Risk Table to determine the patient's CHD Risk.        ATP III CLASSIFICATION (LDL):  <100     mg/dL   Optimal  629-528  mg/dL   Near or Above                    Optimal  130-159  mg/dL   Borderline  413-244  mg/dL   High  >010     mg/dL   Very High Performed at [facility], [address] [place], [place] [address]    TSH 07/25/2022 6.668 (H)  0.350 - 4.500 uIU/mL Final   Comment: Performed by a 3rd Generation assay with a functional sensitivity of <=0.01 uIU/mL. Performed at [facility], [address] [place], [place] [address]    Glucose-Capillary 07/26/2022 104 (H)  70 - 99 mg/dL Final   Glucose reference range applies only to samples taken after fasting for at least 8 hours.   T3, Free 07/31/2022 2.3   2.0 - 4.4 pg/mL Final   Comment: (NOTE) Performed At: [facility] [address] [place], [place]  [id number] Jolene Schimke MD WU:[id number]    Free T4 07/31/2022 0.60 (L)  0.61 - 1.12 ng/dL Final   Comment: (NOTE) Biotin ingestion may interfere with free T4 tests. If the results are inconsistent with the TSH level, previous test results, or the clinical presentation, then consider biotin interference. If needed, order repeat testing after stopping biotin. Performed at [facility], [address] [place], [place] [address]    Glucose-Capillary 08/29/2022 100 (H)  70 - 99 mg/dL Final   Glucose reference range applies only to samples taken after fasting for at least 8 hours.   TSH 09/05/2022 0.793  0.350 - 4.500 uIU/mL Final   Comment: Performed by a 3rd Generation assay with a functional sensitivity of <=0.01 uIU/mL. Performed at [facility], [address], [place], [place] [address]    Free T4 09/05/2022 0.73  0.61 - 1.12 ng/dL Final   Comment: (NOTE) Biotin ingestion may interfere with free T4 tests. If the results are inconsistent with the TSH level, previous test results, or the clinical presentation, then consider biotin interference. If needed, order repeat testing after stopping biotin. Performed at [facility], [address] [place], [place] [address]    Preg Test, Ur 09/06/2022 Negative  Negative Final   Preg Test, Ur 09/06/2022 NEGATIVE  NEGATIVE Final   Comment:        THE SENSITIVITY OF THIS METHODOLOGY IS >24 mIU/mL    Preg Test, Ur 09/05/2022 NEGATIVE  NEGATIVE Final   Comment:        THE SENSITIVITY OF THIS METHODOLOGY IS >24 mIU/mL    Sodium 09/13/2022 136  135 - 145 mmol/L Final   Potassium 09/13/2022 4.5  3.5 - 5.1 mmol/L Final   Chloride 09/13/2022 105  98 - 111 mmol/L Final   CO2 09/13/2022 21 (L)  22 - 32 mmol/L Final   Glucose, Bld 09/13/2022 111 (H)  70 - 99 mg/dL Final   Glucose reference range applies only to  samples taken after fasting for at least 8 hours.   BUN 09/13/2022 14  6 - 20 mg/dL Final   Creatinine, Ser 09/13/2022 0.92  0.44 - 1.00 mg/dL Final   Calcium 62/95/2841 9.1  8.9 - 10.3 mg/dL Final   GFR, Estimated 09/13/2022 >60  >60 mL/min Final   Comment: (NOTE) Calculated using the CKD-EPI Creatinine Equation (2021)    Anion gap 09/13/2022 10  5 - 15 Final   Performed at [facility], [address], [place], [place] [address]   Vitamin B-12 09/13/2022 585  180 - 914 pg/mL Final   Comment: (NOTE) This assay is not validated for testing neonatal or myeloproliferative syndrome specimens for Vitamin B12 levels. Performed at [facility], [address], [place], [place] [address]    Color, Urine 09/14/2022 YELLOW  YELLOW Final   APPearance 09/14/2022 HAZY (A)  CLEAR Final   Specific Gravity, Urine 09/14/2022 1.025  1.005 - 1.030 Final   pH 09/14/2022 5.0  5.0 - 8.0 Final   Glucose, UA 09/14/2022 NEGATIVE  NEGATIVE mg/dL Final   Hgb urine dipstick 09/14/2022 NEGATIVE  NEGATIVE Final   Bilirubin Urine 09/14/2022 NEGATIVE  NEGATIVE Final   Ketones, ur 09/14/2022 NEGATIVE  NEGATIVE mg/dL Final   Protein, ur 53/66/4403 NEGATIVE  NEGATIVE mg/dL Final   Nitrite 47/42/5956 NEGATIVE  NEGATIVE Final   Leukocytes,Ua 09/14/2022 TRACE (A)  NEGATIVE Final   RBC / HPF 09/14/2022 0-5  0 - 5 RBC/hpf Final  WBC, UA 09/14/2022 0-5  0 - 5 WBC/hpf Final   Bacteria, UA 09/14/2022 RARE (A)  NONE SEEN Final   Squamous Epithelial / LPF 09/14/2022 0-5  0 - 5 Final   Mucus 09/14/2022 PRESENT   Final   Performed at [facility], [address], [place], [place] [address]   Glucose-Capillary 09/16/2022 105 (H)  70 - 99 mg/dL Final   Glucose reference range applies only to samples taken after fasting for at least 8 hours.  Admission on 07/04/2022, Discharged on 07/05/2022  Component Date Value Ref Range Status   Sodium 07/04/2022 142  135 - 145 mmol/L Final   Potassium 07/04/2022 4.4   3.5 - 5.1 mmol/L Final   Chloride 07/04/2022 109  98 - 111 mmol/L Final   CO2 07/04/2022 26  22 - 32 mmol/L Final   Glucose, Bld 07/04/2022 96  70 - 99 mg/dL Final   Glucose reference range applies only to samples taken after fasting for at least 8 hours.   BUN 07/04/2022 15  6 - 20 mg/dL Final   Creatinine, Ser 07/04/2022 0.83  0.44 - 1.00 mg/dL Final   Calcium 60/45/4098 9.3  8.9 - 10.3 mg/dL Final   Total Protein 11/91/4782 7.2  6.5 - 8.1 g/dL Final   Albumin 95/62/1308 3.9  3.5 - 5.0 g/dL Final   AST 65/78/4696 23  15 - 41 U/L Final   ALT 07/04/2022 28  0 - 44 U/L Final   Alkaline Phosphatase 07/04/2022 77  38 - 126 U/L Final   Total Bilirubin 07/04/2022 0.4  0.3 - 1.2 mg/dL Final   GFR, Estimated 07/04/2022 >60  >60 mL/min Final   Comment: (NOTE) Calculated using the CKD-EPI Creatinine Equation (2021)    Anion gap 07/04/2022 7  5 - 15 Final   Performed at [facility], [address], [place], [place] [address]   Alcohol, Ethyl (B) 07/04/2022 <10  <10 mg/dL Final   Comment: (NOTE) Lowest detectable limit for serum alcohol is 10 mg/dL.  For medical purposes only. Performed at [facility], [address] [place], [place] [address]    WBC 07/04/2022 7.0  4.0 - 10.5 K/uL Final   RBC 07/04/2022 4.18  3.87 - 5.11 MIL/uL Final   Hemoglobin 07/04/2022 12.8  12.0 - 15.0 g/dL Final   HCT 40/07/2724 39.1  36.0 - 46.0 % Final   MCV 07/04/2022 93.5  80.0 - 100.0 fL Final   MCH 07/04/2022 30.6  26.0 - 34.0 pg Final   MCHC 07/04/2022 32.7  30.0 - 36.0 g/dL Final   RDW 36/64/4034 12.9  11.5 - 15.5 % Final   Platelets 07/04/2022 243  150 - 400 K/uL Final   nRBC 07/04/2022 0.0  0.0 - 0.2 % Final   Neutrophils Relative % 07/04/2022 43  % Final   Neutro Abs 07/04/2022 3.0  1.7 - 7.7 K/uL Final   Lymphocytes Relative 07/04/2022 50  % Final   Lymphs Abs 07/04/2022 3.5  0.7 - 4.0 K/uL Final   Monocytes Relative 07/04/2022 7  % Final   Monocytes  Absolute 07/04/2022 0.5  0.1 - 1.0 K/uL Final   Eosinophils Relative 07/04/2022 0  % Final   Eosinophils Absolute 07/04/2022 0.0  0.0 - 0.5 K/uL Final   Basophils Relative 07/04/2022 0  % Final   Basophils Absolute 07/04/2022 0.0  0.0 - 0.1 K/uL Final   Immature Granulocytes 07/04/2022 0  % Final   Abs Immature Granulocytes 07/04/2022  0.01  0.00 - 0.07 K/uL Final   Performed at [facility], [address] [place], [place] [address]   I-stat hCG, quantitative 07/04/2022 <5.0  <5 mIU/mL Final   Comment 3 07/04/2022          Final   Comment:   GEST. AGE      CONC.  (mIU/mL)   <=1 WEEK        5 - 50     2 WEEKS       50 - 500     3 WEEKS       100 - 10,000     4 WEEKS     1,000 - 30,000        FEMALE AND NON-PREGNANT FEMALE:     LESS THAN 5 mIU/mL   Admission on 06/14/2022, Discharged on 06/14/2022  Component Date Value Ref Range Status   Color, UA 06/14/2022 yellow  yellow Final   Clarity, UA 06/14/2022 cloudy (A)  clear Final   Glucose, UA 06/14/2022 negative  negative mg/dL Final   Bilirubin, UA 60/45/4098 negative  negative Final   Ketones, POC UA 06/14/2022 negative  negative mg/dL Final   Spec Grav, UA 11/91/4782 1.025  1.010 - 1.025 Final   Blood, UA 06/14/2022 negative  negative Final   pH, UA 06/14/2022 7.5  5.0 - 8.0 Final   Protein Ur, POC 06/14/2022 =30 (A)  negative mg/dL Final   Urobilinogen, UA 06/14/2022 0.2  0.2 or 1.0 E.U./dL Final   Nitrite, UA 95/62/1308 Negative  Negative Final   Leukocytes, UA 06/14/2022 Negative  Negative Final   Preg Test, Ur 06/14/2022 Negative  Negative Final   Specimen Description 06/14/2022 URINE, CLEAN CATCH   Final   Special Requests 06/14/2022    Final                   Value:NONE Performed at [facility], [address], [place], [place] [address]    Culture 06/14/2022 MULTIPLE SPECIES PRESENT, SUGGEST RECOLLECTION (A)   Final   Report Status 06/14/2022 06/16/2022 FINAL   Final  Admission on 06/07/2022,  Discharged on 06/10/2022  Component Date Value Ref Range Status   SARS Coronavirus 2 by RT PCR 06/07/2022 NEGATIVE  NEGATIVE Final   Comment: (NOTE) SARS-CoV-2 target nucleic acids are NOT DETECTED.  The SARS-CoV-2 RNA is generally detectable in upper respiratory specimens during the acute phase of infection. The lowest concentration of SARS-CoV-2 viral copies this assay can detect is 138 copies/mL. A negative result does not preclude SARS-Cov-2 infection and should not be used as the sole basis for treatment or other patient management decisions. A negative result may occur with  improper specimen collection/handling, submission of specimen other than nasopharyngeal swab, presence of viral mutation(s) within the areas targeted by this assay, and inadequate number of viral copies(<138 copies/mL). A negative result must be combined with clinical observations, patient history, and epidemiological information. The expected result is Negative.  Fact Sheet for Patients:  BloggerCourse.com  Fact Sheet for Healthcare Providers:  SeriousBroker.it  This test is no                          t yet approved or cleared by the [place] FDA and  has been authorized for detection and/or diagnosis of SARS-CoV-2 by FDA under an Emergency Use Authorization (EUA). This EUA will remain  in effect (meaning this test can be used) for the  duration of the COVID-19 declaration under Section 564(b)(1) of the Act, 21 U.S.C.section 360bbb-3(b)(1), unless the authorization is terminated  or revoked sooner.       Influenza A by PCR 06/07/2022 NEGATIVE  NEGATIVE Final   Influenza B by PCR 06/07/2022 NEGATIVE  NEGATIVE Final   Comment: (NOTE) The Xpert Xpress SARS-CoV-2/FLU/RSV plus assay is intended as an aid in the diagnosis of influenza from Nasopharyngeal swab specimens and should not be used as a sole basis for treatment. Nasal washings and aspirates  are unacceptable for Xpert Xpress SARS-CoV-2/FLU/RSV testing.  Fact Sheet for Patients: BloggerCourse.com  Fact Sheet for Healthcare Providers: SeriousBroker.it  This test is not yet approved or cleared by the [place] FDA and has been authorized for detection and/or diagnosis of SARS-CoV-2 by FDA under an Emergency Use Authorization (EUA). This EUA will remain in effect (meaning this test can be used) for the duration of the COVID-19 declaration under Section 564(b)(1) of the Act, 21 U.S.C. section 360bbb-3(b)(1), unless the authorization is terminated or revoked.  Performed at [facility], [address], [place], [place] [address]    WBC 06/07/2022 5.7  4.0 - 10.5 K/uL Final   RBC 06/07/2022 4.39  3.87 - 5.11 MIL/uL Final   Hemoglobin 06/07/2022 13.2  12.0 - 15.0 g/dL Final   HCT 10/93/2355 40.4  36.0 - 46.0 % Final   MCV 06/07/2022 92.0  80.0 - 100.0 fL Final   MCH 06/07/2022 30.1  26.0 - 34.0 pg Final   MCHC 06/07/2022 32.7  30.0 - 36.0 g/dL Final   RDW 73/22/0254 12.4  11.5 - 15.5 % Final   Platelets 06/07/2022 308  150 - 400 K/uL Final   nRBC 06/07/2022 0.0  0.0 - 0.2 % Final   Neutrophils Relative % 06/07/2022 42  % Final   Neutro Abs 06/07/2022 2.4  1.7 - 7.7 K/uL Final   Lymphocytes Relative 06/07/2022 54  % Final   Lymphs Abs 06/07/2022 3.1  0.7 - 4.0 K/uL Final   Monocytes Relative 06/07/2022 4  % Final   Monocytes Absolute 06/07/2022 0.2  0.1 - 1.0 K/uL Final   Eosinophils Relative 06/07/2022 0  % Final   Eosinophils Absolute 06/07/2022 0.0  0.0 - 0.5 K/uL Final   Basophils Relative 06/07/2022 0  % Final   Basophils Absolute 06/07/2022 0.0  0.0 - 0.1 K/uL Final   Immature Granulocytes 06/07/2022 0  % Final   Abs Immature Granulocytes 06/07/2022 0.01  0.00 - 0.07 K/uL Final   Performed at [facility], [address], [place], [place] [address]   Sodium 06/07/2022 139  135 - 145 mmol/L Final    Potassium 06/07/2022 4.0  3.5 - 5.1 mmol/L Final   Chloride 06/07/2022 104  98 - 111 mmol/L Final   CO2 06/07/2022 28  22 - 32 mmol/L Final   Glucose, Bld 06/07/2022 104 (H)  70 - 99 mg/dL Final   Glucose reference range applies only to samples taken after fasting for at least 8 hours.   BUN 06/07/2022 8  6 - 20 mg/dL Final   Creatinine, Ser 06/07/2022 0.84  0.44 - 1.00 mg/dL Final   Calcium 37/62/8315 9.1  8.9 - 10.3 mg/dL Final   Total Protein 17/61/6073 6.9  6.5 - 8.1 g/dL Final   Albumin 71/03/2693 3.7  3.5 - 5.0 g/dL Final   AST 85/46/2703 19  15 - 41 U/L Final   ALT 06/07/2022 22  0 - 44 U/L Final  Alkaline Phosphatase 06/07/2022 54  38 - 126 U/L Final   Total Bilirubin 06/07/2022 0.4  0.3 - 1.2 mg/dL Final   GFR, Estimated 06/07/2022 >60  >60 mL/min Final   Comment: (NOTE) Calculated using the CKD-EPI Creatinine Equation (2021)    Anion gap 06/07/2022 7  5 - 15 Final   Performed at [facility], [address] [place], [place] [address]   Hgb A1c MFr Bld 06/07/2022 5.0  4.8 - 5.6 % Final   Comment: (NOTE) Pre diabetes:          5.7%-6.4%  Diabetes:              >6.4%  Glycemic control for   <7.0% adults with diabetes    Mean Plasma Glucose 06/07/2022 96.8  mg/dL Final   Performed at [facility], [address] [place], [place] [address]   TSH 06/07/2022 1.620  0.350 - 4.500 uIU/mL Final   Comment: Performed by a 3rd Generation assay with a functional sensitivity of <=0.01 uIU/mL. Performed at [facility], [address], [place], [place] [address]    RPR Ser Ql 06/07/2022 NON REACTIVE  NON REACTIVE Final   Performed at [facility], [address] [place], [place] [address]   Color, Urine 06/07/2022 YELLOW  YELLOW Final   APPearance 06/07/2022 HAZY (A)  CLEAR Final   Specific Gravity, Urine 06/07/2022 1.018  1.005 - 1.030 Final   pH 06/07/2022 7.0  5.0 - 8.0 Final   Glucose, UA 06/07/2022 NEGATIVE  NEGATIVE mg/dL Final   Hgb urine  dipstick 06/07/2022 NEGATIVE  NEGATIVE Final   Bilirubin Urine 06/07/2022 NEGATIVE  NEGATIVE Final   Ketones, ur 06/07/2022 NEGATIVE  NEGATIVE mg/dL Final   Protein, ur 66/03/44 NEGATIVE  NEGATIVE mg/dL Final   Nitrite 99/77/4142 NEGATIVE  NEGATIVE Final   Leukocytes,Ua 06/07/2022 NEGATIVE  NEGATIVE Final   Performed at [facility], [address], [place], [place] [address]   Cholesterol 06/07/2022 164  0 - 200 mg/dL Final   Triglycerides 02/33/4356 158 (H)  <150 mg/dL Final   HDL 86/16/8372 44  >40 mg/dL Final   Total CHOL/HDL Ratio 06/07/2022 3.7  RATIO Final   VLDL 06/07/2022 32  0 - 40 mg/dL Final   LDL Cholesterol 06/07/2022 88  0 - 99 mg/dL Final   Comment:        Total Cholesterol/HDL:CHD Risk Coronary Heart Disease Risk Table                     Men   Women  1/2 Average Risk   3.4   3.3  Average Risk       5.0   4.4  2 X Average Risk   9.6   7.1  3 X Average Risk  23.4   11.0        Use the calculated Patient Ratio above and the CHD Risk Table to determine the patient's CHD Risk.        ATP III CLASSIFICATION (LDL):  <100     mg/dL   Optimal  902-111  mg/dL   Near or Above                    Optimal  130-159  mg/dL   Borderline  552-080  mg/dL   High  >223     mg/dL   Very High Performed at [facility], [address], [place],  [place] [address]    HIV Screen 4th Generation wRfx 06/07/2022 Non Reactive  Non Reactive Final   Performed at [facility], [address] [place], [place] [address]   SARSCOV2ONAVIRUS 2 AG 06/07/2022 NEGATIVE  NEGATIVE Final   Comment: (NOTE) SARS-CoV-2 antigen NOT DETECTED.   Negative results are presumptive.  Negative results do not preclude SARS-CoV-2 infection and should not be used as the sole basis for treatment or other patient management decisions, including infection  control decisions, particularly in the presence of clinical signs and  symptoms consistent with COVID-19, or in those who have been  in contact with the virus.  Negative results must be combined with clinical observations, patient history, and epidemiological information. The expected result is Negative.  Fact Sheet for Patients: https://www.jennings-kim.com/  Fact Sheet for Healthcare Providers: https://alexander-rogers.biz/  This test is not yet approved or cleared by the [place] FDA and  has been authorized for detection and/or diagnosis of SARS-CoV-2 by FDA under an Emergency Use Authorization (EUA).  This EUA will remain in effect (meaning this test can be used) for the duration of  the COV                          ID-19 declaration under Section 564(b)(1) of the Act, 21 U.S.C. section 360bbb-3(b)(1), unless the authorization is terminated or revoked sooner.     POC Amphetamine UR 06/07/2022 None Detected  NONE DETECTED (Cut Off Level 1000 ng/mL) Final   POC Secobarbital (BAR) 06/07/2022 None Detected  NONE DETECTED (Cut Off Level 300 ng/mL) Final   POC Buprenorphine (BUP) 06/07/2022 None Detected  NONE DETECTED (Cut Off Level 10 ng/mL) Final   POC Oxazepam (BZO) 06/07/2022 None Detected  NONE DETECTED (Cut Off Level 300 ng/mL) Final   POC Cocaine UR 06/07/2022 None Detected  NONE DETECTED (Cut Off Level 300 ng/mL) Final   POC Methamphetamine UR 06/07/2022 None Detected  NONE DETECTED (Cut Off Level 1000 ng/mL) Final   POC Morphine 06/07/2022 None Detected  NONE DETECTED (Cut Off Level 300 ng/mL) Final   POC Methadone UR 06/07/2022 None Detected  NONE DETECTED (Cut Off Level 300 ng/mL) Final   POC Oxycodone UR 06/07/2022 None Detected  NONE DETECTED (Cut Off Level 100 ng/mL) Final   POC Marijuana UR 06/07/2022 None Detected  NONE DETECTED (Cut Off Level 50 ng/mL) Final   Preg Test, Ur 06/08/2022 NEGATIVE  NEGATIVE Final   Comment:        THE SENSITIVITY OF THIS METHODOLOGY IS >20 mIU/mL. Performed at [facility], [address], [place], [place] [address]    Preg Test,  Ur 06/08/2022 NEGATIVE  NEGATIVE Final   Comment:        THE SENSITIVITY OF THIS METHODOLOGY IS >24 mIU/mL     Blood Alcohol level:  Lab Results  Component Value Date   ETH <10 07/04/2022   ETH <10 02/07/2022    Metabolic Disorder Labs: Lab Results  Component Value Date   HGBA1C 5.0 06/07/2022   MPG 96.8 06/07/2022   MPG 96.8 02/07/2022   No results found for: "PROLACTIN" Lab Results  Component Value Date   CHOL 181 07/25/2022   TRIG 118 07/25/2022   HDL 45 07/25/2022   CHOLHDL 4.0 07/25/2022   VLDL 24 07/25/2022   LDLCALC 112 (H) 07/25/2022   LDLCALC 88 06/07/2022    Therapeutic Lab Levels: No results found for: "LITHIUM" No results found for: "VALPROATE" No results  found for: "CBMZ"  Physical Findings   GAD-7    Flowsheet Row [facility]/17/2021 [facility]  Total GAD-7 Score 17      PHQ2-9    Flowsheet Row ED from 06/07/2022 in [facility] Office Visit from 12/17/2021 in [facility]  PHQ-2 Total Score 1 2  PHQ-9 Total Score 2 8      Flowsheet Row ED from 07/25/2022 in [facility] ED from 07/04/2022 in [facility] from 06/14/2022 in [facility]   C-SSRS RISK CATEGORY No Risk No Risk No Risk        Musculoskeletal  Strength & Muscle Tone: within normal limits Gait & Station: normal Patient leans: N/A  Psychiatric Specialty Exam  Presentation  General Appearance:  Appropriate for Environment  Eye Contact: Fair  Speech: Clear and Coherent  Speech Volume: Normal  Handedness: Right   Mood and Affect  Mood: Euthymic  Affect: Congruent   Thought Process  Thought Processes: Coherent  Descriptions of Associations:Intact  Orientation:Full (Time, Place and Person)  Thought Content:Logical  Diagnosis of Schizophrenia or Schizoaffective disorder in  past: Yes  Duration of Psychotic Symptoms: Greater than six months   Hallucinations:Hallucinations: None  Ideas of Reference:None  Suicidal Thoughts:Suicidal Thoughts: No  Homicidal Thoughts:Homicidal Thoughts: No   Sensorium  Memory: Immediate Fair; Recent Fair; Remote Fair  Judgment: Fair  Insight: Fair   Art therapist  Concentration: Fair  Attention Span: Fair  Recall: [employer] of Knowledge: Fair  Language: Fair   Psychomotor Activity  Psychomotor Activity: Psychomotor Activity: Normal   Assets  Assets: Communication Skills; Desire for Improvement   Sleep  Sleep: Sleep: Fair Number of Hours of Sleep: 8   No data recorded  Physical Exam  Physical Exam HENT:     Head: Normocephalic.     Nose: Nose normal.  Eyes:     Conjunctiva/sclera: Conjunctivae normal.  Cardiovascular:     Rate and Rhythm: Normal rate.  Pulmonary:     Effort: Pulmonary effort is normal.  Musculoskeletal:        General: Normal range of motion.     Cervical back: Normal range of motion.  Neurological:     Mental Status: She is alert and oriented to person, place, and time.    Review of Systems  Constitutional: Negative.   HENT: Negative.    Eyes: Negative.   Respiratory: Negative.    Cardiovascular: Negative.   Gastrointestinal: Negative.   Genitourinary: Negative.   Musculoskeletal: Negative.   Neurological: Negative.   Endo/Heme/Allergies: Negative.    Blood pressure 112/68, pulse 99, temperature 98.1 F (36.7 C), temperature source Oral, resp. rate 20, SpO2 99 %. There is no height or weight on file to calculate BMI.  Treatment Plan Summary: Disposition-Patient remains cleared by psychiatry. Transition of care team is pursuing appropriate placement.    Labs reviewed.  No new lab results.    Vital signs reviewed, and are WNL.    Medication reviewed, no new medication changes on 09/29/22.  ARIPiprazole ER  400 mg Intramuscular Q28 days    docusate sodium  100 mg Oral Daily   levothyroxine  100 mcg Oral Q0600   loratadine  10 mg Oral Daily   metFORMIN  500 mg Oral Q breakfast   nicotine  14 mg Transdermal Q0600   Oxcarbazepine  300 mg Oral BID  pantoprazole  20 mg Oral Daily   QUEtiapine  400 mg Oral BID   sertraline  150 mg Oral Daily   traZODone  100 mg Oral QHS   valACYclovir  500 mg Oral Daily    Amariana Mirando L, NP 09/29/2022 11:12 AM

## 2022-09-30 ENCOUNTER — Encounter (HOSPITAL_COMMUNITY): Payer: Self-pay | Admitting: Registered Nurse

## 2022-09-30 DIAGNOSIS — F333 Major depressive disorder, recurrent, severe with psychotic symptoms: Secondary | ICD-10-CM | POA: Diagnosis not present

## 2022-09-30 LAB — RESP PANEL BY RT-PCR (RSV, FLU A&B, COVID)  RVPGX2
Influenza A by PCR: NEGATIVE
Influenza B by PCR: NEGATIVE
Resp Syncytial Virus by PCR: NEGATIVE
SARS Coronavirus 2 by RT PCR: NEGATIVE

## 2022-09-30 NOTE — ED Notes (Signed)
Pt had bedtime snack. 

## 2022-09-30 NOTE — Progress Notes (Signed)
Received Nicole [name] this AM asleep in her chair bed, she woke up on her own. She received her medications and remained visible in the milieu. She denied all of the psychiatric symptoms and did not voice any concerns.

## 2022-09-30 NOTE — ED Provider Notes (Signed)
Behavioral Health Progress Note  Date and Time: 09/30/2022 10:42 AM Name: Nicole [name] MRN:  [id number]  Per HPI: Nicole [name] is a 31 year old female with a past psychiatric history significant for schizophrenia and major depressive disorder with psychosis who voluntarily presented to [facility] on 07/25/2022 via [facility] after a verbal altercation with [name] (not patient's legal guardian) for transient SI. This is patient's fourth visit to [facility] for similar concerns this year. Patient has been dismissed from previous group home due to threatening suicidal and homicidal ideations, then leaving the group home. Legal Guardian: [name]) transitioning to be [name])   Subjective:  "Not feeling to good.  I feel like someone is sitting on my chest."    Nicole [name], 31 y.o., female patient seen face to face by this provider, consulted with Dr. Gretta Cool; and chart reviewed on 09/30/22.  On evaluation Nicole [name] reports she is not feeling well today (chills, sweating, SOB, feel someone sitting on chest).  She continues to deny suicidal/self-harm/homicidal ideation, psychosis, and paranoia.  States that she has been eating/sleeping without difficulty and taking medications as ordered with no adverse reaction.  Patient asked if she would be leaving this week for placement and informed that I would check with social work to be sure.   During evaluation Nicole [name] is lying in bed wrapped up in blanket shivering.  She is alert, oriented x 4, calm, cooperative and attentive.  Her mood is anxious burt euthymic with congruent affect.  She has normal speech, and behavior.  Objectively there is no evidence of psychosis/mania or delusional thinking.  Patient is able to converse coherently, goal directed thoughts, no distractibility, or pre-occupation.  She denies suicidal/self-harm/homicidal ideation, psychosis, and paranoia.   Patient continues to be psychiatrically cleared awaiting  appropriate placement.  DSS and social work continue to work on finding appropriated placement for patient  Diagnosis:  Final diagnoses:  Severe episode of recurrent major depressive disorder, with psychotic features (HCC)    Total Time spent with patient: 20 minutes  Past Psychiatric History: schizoaffective, borderline IDD, anxiety, and MDD.       Past Medical History:  Past Medical History:  Diagnosis Date   Anxiety    Depression    Hypothyroidism 08/07/2022   Tobacco use disorder 08/07/2022    Past Surgical History:  Procedure Laterality Date   WISDOM TOOTH EXTRACTION Bilateral 2020   Family History:  Family History  Problem Relation Age of Onset   Hypertension Father    Diabetes Father    Family Psychiatric  History: None reported Social History:  Social History   Substance and Sexual Activity  Alcohol Use Never     Social History   Substance and Sexual Activity  Drug Use Never    Social History   Socioeconomic History   Marital status: Single    Spouse name: Not on file   Number of children: Not on file   Years of education: Not on file   Highest education level: Not on file  Occupational History   Not on file  Tobacco Use   Smoking status: Every Day    Types: Cigarettes   Smokeless tobacco: Never  Vaping Use   Vaping Use: Some days  Substance and Sexual Activity   Alcohol use: Never   Drug use: Never   Sexual activity: Yes    Partners: Female    Birth control/protection: Implant  Other Topics Concern   Not on file  Social History Narrative  Not on file   Social Determinants of Health   Financial Resource Strain: Not on file  Food Insecurity: Not on file  Transportation Needs: Not on file  Physical Activity: Not on file  Stress: Not on file  Social Connections: Not on file   SDOH:  SDOH Screenings   Depression (PHQ2-9): Low Risk  (06/10/2022)  Tobacco Use: High Risk (09/30/2022)   Additional Social History:    Pain Medications:  SEE MAR Prescriptions: SEE MAR Over the Counter: SEE MAR History of alcohol / drug use?: Yes Longest period of sobriety (when/how long): N/A Negative Consequences of Use:  (NONE) Withdrawal Symptoms: None Name of Substance 1: ETOH IN PAST--PT CURRENTLY DENIES 1 - Frequency: SOCIAL USE ETOH IN PAST 1 - Method of Aquiring: LEGAL 1- Route of Use: ORAL DRINK Name of Substance 2: NICOTINE-VAPE AND CIGARETTES 2 - Amount (size/oz): LESS THAN ONE PACK 2 - Frequency: DAILY 2 - Method of Aquiring: LEGAL 2 - Route of Substance Use: ORAL SMOKE                Sleep: Good  Appetite:  Good  Current Medications:  Current Facility-Administered Medications  Medication Dose Route Frequency Provider Last Rate Last Admin   acetaminophen (TYLENOL) tablet 650 mg  650 mg Oral Q6H PRN Onuoha, Chinwendu V, NP   650 mg at 09/21/22 2200   ARIPiprazole ER (ABILIFY MAINTENA) injection 400 mg  400 mg Intramuscular Q28 days Princess Bruins, DO   400 mg at 09/27/22 1445   docusate sodium (COLACE) capsule 100 mg  100 mg Oral Daily [name], MD   100 mg at 09/30/22 1033   hydrOXYzine (ATARAX) tablet 25 mg  25 mg Oral TID PRN Onuoha, Chinwendu V, NP   25 mg at 09/29/22 1942   ibuprofen (ADVIL) tablet 400 mg  400 mg Oral Q6H PRN Onuoha, Chinwendu V, NP   400 mg at 09/29/22 1942   levothyroxine (SYNTHROID) tablet 100 mcg  100 mcg Oral Q0600 Princess Bruins, DO   100 mcg at 09/30/22 0526   loratadine (CLARITIN) tablet 10 mg  10 mg Oral Daily Taje Tondreau B, NP   10 mg at 09/30/22 1033   metFORMIN (GLUCOPHAGE) tablet 500 mg  500 mg Oral Q breakfast Park Pope, MD   500 mg at 09/30/22 1610   nicotine (NICODERM CQ - dosed in mg/24 hours) patch 14 mg  14 mg Transdermal Q0600 Onuoha, Chinwendu V, NP   14 mg at 09/30/22 1038   ondansetron (ZOFRAN-ODT) disintegrating tablet 4 mg  4 mg Oral Q8H PRN Sindy Guadeloupe, NP   4 mg at 09/26/22 2127   Oxcarbazepine (TRILEPTAL) tablet 300 mg  300 mg Oral BID Onuoha, Chinwendu  V, NP   300 mg at 09/30/22 1033   pantoprazole (PROTONIX) EC tablet 20 mg  20 mg Oral Daily Princess Bruins, DO   20 mg at 09/30/22 1033   polyethylene glycol (MIRALAX / GLYCOLAX) packet 17 g  17 g Oral Daily PRN Ajibola, Ene A, NP   17 g at 09/28/22 0225   QUEtiapine (SEROQUEL) tablet 400 mg  400 mg Oral BID Onuoha, Chinwendu V, NP   400 mg at 09/30/22 1033   sertraline (ZOLOFT) tablet 150 mg  150 mg Oral Daily Onuoha, Chinwendu V, NP   150 mg at 09/30/22 1032   simethicone (MYLICON) chewable tablet 80 mg  80 mg Oral Q6H PRN Sindy Guadeloupe, NP   80 mg at 09/29/22 2144   traZODone (DESYREL) tablet  100 mg  100 mg Oral QHS Vernard Gambles H, NP   100 mg at 09/29/22 2137   valACYclovir (VALTREX) tablet 500 mg  500 mg Oral Daily Princess Bruins, DO   500 mg at 09/30/22 1032   ziprasidone (GEODON) injection 20 mg  20 mg Intramuscular Q12H PRN Onuoha, Chinwendu V, NP   20 mg at 09/19/22 2133   Current Outpatient Medications  Medication Sig Dispense Refill   ABILIFY MAINTENA 400 MG PRSY prefilled syringe 400 mg every 28 (twenty-eight) days.     cetirizine (ZYRTEC) 10 MG tablet Take 10 mg by mouth daily.     cyclobenzaprine (FLEXERIL) 10 MG tablet Take 1 tablet (10 mg total) by mouth 2 (two) times daily as needed for muscle spasms. 20 tablet 0   fluticasone (FLONASE) 50 MCG/ACT nasal spray Place 1 spray into both nostrils daily.     meloxicam (MOBIC) 15 MG tablet Take 15 mg by mouth daily.     Oxcarbazepine (TRILEPTAL) 300 MG tablet Take 1 tablet (300 mg total) by mouth 2 (two) times daily. 60 tablet 0   QUEtiapine (SEROQUEL) 400 MG tablet Take 1 tablet (400 mg total) by mouth 2 (two) times daily. 60 tablet 0   sertraline (ZOLOFT) 50 MG tablet Take 3 tablets (150 mg total) by mouth in the morning. 90 tablet 0   traZODone (DESYREL) 100 MG tablet Take 1 tablet (100 mg total) by mouth at bedtime. 30 tablet 0   valACYclovir (VALTREX) 500 MG tablet Take 500 mg by mouth daily.      Labs  Lab Results:   Admission on 07/25/2022  Component Date Value Ref Range Status   SARS Coronavirus 2 by RT PCR 07/25/2022 NEGATIVE  NEGATIVE Final   Comment: (NOTE) SARS-CoV-2 target nucleic acids are NOT DETECTED.  The SARS-CoV-2 RNA is generally detectable in upper respiratory specimens during the acute phase of infection. The lowest concentration of SARS-CoV-2 viral copies this assay can detect is 138 copies/mL. A negative result does not preclude SARS-Cov-2 infection and should not be used as the sole basis for treatment or other patient management decisions. A negative result may occur with  improper specimen collection/handling, submission of specimen other than nasopharyngeal swab, presence of viral mutation(s) within the areas targeted by this assay, and inadequate number of viral copies(<138 copies/mL). A negative result must be combined with clinical observations, patient history, and epidemiological information. The expected result is Negative.  Fact Sheet for Patients:  BloggerCourse.com  Fact Sheet for Healthcare Providers:  SeriousBroker.it  This test is no                          t yet approved or cleared by the [place] FDA and  has been authorized for detection and/or diagnosis of SARS-CoV-2 by FDA under an Emergency Use Authorization (EUA). This EUA will remain  in effect (meaning this test can be used) for the duration of the COVID-19 declaration under Section 564(b)(1) of the Act, 21 U.S.C.section 360bbb-3(b)(1), unless the authorization is terminated  or revoked sooner.       Influenza A by PCR 07/25/2022 NEGATIVE  NEGATIVE Final   Influenza B by PCR 07/25/2022 NEGATIVE  NEGATIVE Final   Comment: (NOTE) The Xpert Xpress SARS-CoV-2/FLU/RSV plus assay is intended as an aid in the diagnosis of influenza from Nasopharyngeal swab specimens and should not be used as a sole basis for treatment. Nasal washings  and aspirates are unacceptable for Xpert Xpress  SARS-CoV-2/FLU/RSV testing.  Fact Sheet for Patients: BloggerCourse.com  Fact Sheet for Healthcare Providers: SeriousBroker.it  This test is not yet approved or cleared by the [place] FDA and has been authorized for detection and/or diagnosis of SARS-CoV-2 by FDA under an Emergency Use Authorization (EUA). This EUA will remain in effect (meaning this test can be used) for the duration of the COVID-19 declaration under Section 564(b)(1) of the Act, 21 U.S.C. section 360bbb-3(b)(1), unless the authorization is terminated or revoked.  Performed at [facility], [address] [place], [place] [address]    WBC 07/25/2022 8.3  4.0 - 10.5 K/uL Final   RBC 07/25/2022 4.44  3.87 - 5.11 MIL/uL Final   Hemoglobin 07/25/2022 13.7  12.0 - 15.0 g/dL Final   HCT 61/95/0932 40.2  36.0 - 46.0 % Final   MCV 07/25/2022 90.5  80.0 - 100.0 fL Final   MCH 07/25/2022 30.9  26.0 - 34.0 pg Final   MCHC 07/25/2022 34.1  30.0 - 36.0 g/dL Final   RDW 67/09/4579 12.2  11.5 - 15.5 % Final   Platelets 07/25/2022 248  150 - 400 K/uL Final   nRBC 07/25/2022 0.0  0.0 - 0.2 % Final   Neutrophils Relative % 07/25/2022 43  % Final   Neutro Abs 07/25/2022 3.6  1.7 - 7.7 K/uL Final   Lymphocytes Relative 07/25/2022 52  % Final   Lymphs Abs 07/25/2022 4.2 (H)  0.7 - 4.0 K/uL Final   Monocytes Relative 07/25/2022 5  % Final   Monocytes Absolute 07/25/2022 0.4  0.1 - 1.0 K/uL Final   Eosinophils Relative 07/25/2022 0  % Final   Eosinophils Absolute 07/25/2022 0.0  0.0 - 0.5 K/uL Final   Basophils Relative 07/25/2022 0  % Final   Basophils Absolute 07/25/2022 0.0  0.0 - 0.1 K/uL Final   Immature Granulocytes 07/25/2022 0  % Final   Abs Immature Granulocytes 07/25/2022 0.02  0.00 - 0.07 K/uL Final   Performed at [facility], [address], [place], [place] [address]   Sodium 07/25/2022 138  135 -  145 mmol/L Final   Potassium 07/25/2022 4.0  3.5 - 5.1 mmol/L Final   Chloride 07/25/2022 104  98 - 111 mmol/L Final   CO2 07/25/2022 29  22 - 32 mmol/L Final   Glucose, Bld 07/25/2022 83  70 - 99 mg/dL Final   Glucose reference range applies only to samples taken after fasting for at least 8 hours.   BUN 07/25/2022 11  6 - 20 mg/dL Final   Creatinine, Ser 07/25/2022 0.97  0.44 - 1.00 mg/dL Final   Calcium 82/50/5397 9.2  8.9 - 10.3 mg/dL Final   Total Protein 67/34/1937 7.0  6.5 - 8.1 g/dL Final   Albumin 90/24/0973 3.8  3.5 - 5.0 g/dL Final   AST 53/29/9242 18  15 - 41 U/L Final   ALT 07/25/2022 22  0 - 44 U/L Final   Alkaline Phosphatase 07/25/2022 64  38 - 126 U/L Final   Total Bilirubin 07/25/2022 0.2 (L)  0.3 - 1.2 mg/dL Final   GFR, Estimated 07/25/2022 >60  >60 mL/min Final   Comment: (NOTE) Calculated using the CKD-EPI Creatinine Equation (2021)    Anion gap 07/25/2022 5  5 - 15 Final   Performed at [facility], [address], [place], [place] [address]   POC Amphetamine UR 07/25/2022 None Detected  NONE DETECTED (Cut Off Level 1000 ng/mL) Preliminary   POC Secobarbital (BAR)  07/25/2022 None Detected  NONE DETECTED (Cut Off Level 300 ng/mL) Preliminary   POC Buprenorphine (BUP) 07/25/2022 None Detected  NONE DETECTED (Cut Off Level 10 ng/mL) Preliminary   POC Oxazepam (BZO) 07/25/2022 None Detected  NONE DETECTED (Cut Off Level 300 ng/mL) Preliminary   POC Cocaine UR 07/25/2022 None Detected  NONE DETECTED (Cut Off Level 300 ng/mL) Preliminary   POC Methamphetamine UR 07/25/2022 None Detected  NONE DETECTED (Cut Off Level 1000 ng/mL) Preliminary   POC Morphine 07/25/2022 None Detected  NONE DETECTED (Cut Off Level 300 ng/mL) Preliminary   POC Methadone UR 07/25/2022 None Detected  NONE DETECTED (Cut Off Level 300 ng/mL) Preliminary   POC Oxycodone UR 07/25/2022 None Detected  NONE DETECTED (Cut Off Level 100 ng/mL) Preliminary   POC Marijuana UR 07/25/2022 None Detected   NONE DETECTED (Cut Off Level 50 ng/mL) Preliminary   SARSCOV2ONAVIRUS 2 AG 07/25/2022 NEGATIVE  NEGATIVE Final   Comment: (NOTE) SARS-CoV-2 antigen NOT DETECTED.   Negative results are presumptive.  Negative results do not preclude SARS-CoV-2 infection and should not be used as the sole basis for treatment or other patient management decisions, including infection  control decisions, particularly in the presence of clinical signs and  symptoms consistent with COVID-19, or in those who have been in contact with the virus.  Negative results must be combined with clinical observations, patient history, and epidemiological information. The expected result is Negative.  Fact Sheet for Patients: https://www.jennings-kim.com/  Fact Sheet for Healthcare Providers: https://alexander-rogers.biz/  This test is not yet approved or cleared by the [place] FDA and  has been authorized for detection and/or diagnosis of SARS-CoV-2 by FDA under an Emergency Use Authorization (EUA).  This EUA will remain in effect (meaning this test can be used) for the duration of  the COV                          ID-19 declaration under Section 564(b)(1) of the Act, 21 U.S.C. section 360bbb-3(b)(1), unless the authorization is terminated or revoked sooner.     Cholesterol 07/25/2022 181  0 - 200 mg/dL Final   Triglycerides 16/07/9603 118  <150 mg/dL Final   HDL 54/06/8118 45  >40 mg/dL Final   Total CHOL/HDL Ratio 07/25/2022 4.0  RATIO Final   VLDL 07/25/2022 24  0 - 40 mg/dL Final   LDL Cholesterol 07/25/2022 112 (H)  0 - 99 mg/dL Final   Comment:        Total Cholesterol/HDL:CHD Risk Coronary Heart Disease Risk Table                     Men   Women  1/2 Average Risk   3.4   3.3  Average Risk       5.0   4.4  2 X Average Risk   9.6   7.1  3 X Average Risk  23.4   11.0        Use the calculated Patient Ratio above and the CHD Risk Table to determine the patient's CHD  Risk.        ATP III CLASSIFICATION (LDL):  <100     mg/dL   Optimal  147-829  mg/dL   Near or Above                    Optimal  130-159  mg/dL   Borderline  562-130  mg/dL   High  >865  mg/dL   Very High Performed at [facility], [address] [place], [place] [address]    TSH 07/25/2022 6.668 (H)  0.350 - 4.500 uIU/mL Final   Comment: Performed by a 3rd Generation assay with a functional sensitivity of <=0.01 uIU/mL. Performed at [facility], [address], [place], [place] [address]    Glucose-Capillary 07/26/2022 104 (H)  70 - 99 mg/dL Final   Glucose reference range applies only to samples taken after fasting for at least 8 hours.   T3, Free 07/31/2022 2.3  2.0 - 4.4 pg/mL Final   Comment: (NOTE) Performed At: [facility] [address] [place], [place] [id number] Jolene Schimke MD [id number]    Free T4 07/31/2022 0.60 (L)  0.61 - 1.12 ng/dL Final   Comment: (NOTE) Biotin ingestion may interfere with free T4 tests. If the results are inconsistent with the TSH level, previous test results, or the clinical presentation, then consider biotin interference. If needed, order repeat testing after stopping biotin. Performed at [facility], [address], [place], [place] [address]    Glucose-Capillary 08/29/2022 100 (H)  70 - 99 mg/dL Final   Glucose reference range applies only to samples taken after fasting for at least 8 hours.   TSH 09/05/2022 0.793  0.350 - 4.500 uIU/mL Final   Comment: Performed by a 3rd Generation assay with a functional sensitivity of <=0.01 uIU/mL. Performed at [facility], [address], [place], [place] [address]    Free T4 09/05/2022 0.73  0.61 - 1.12 ng/dL Final   Comment: (NOTE) Biotin ingestion may interfere with free T4 tests. If the results are inconsistent with the TSH level, previous test results, or the clinical presentation, then consider biotin interference. If needed, order repeat testing  after stopping biotin. Performed at [facility], [address], [place], [place] [address]    Preg Test, Ur 09/06/2022 Negative  Negative Final   Preg Test, Ur 09/06/2022 NEGATIVE  NEGATIVE Final   Comment:        THE SENSITIVITY OF THIS METHODOLOGY IS >24 mIU/mL    Preg Test, Ur 09/05/2022 NEGATIVE  NEGATIVE Final   Comment:        THE SENSITIVITY OF THIS METHODOLOGY IS >24 mIU/mL    Sodium 09/13/2022 136  135 - 145 mmol/L Final   Potassium 09/13/2022 4.5  3.5 - 5.1 mmol/L Final   Chloride 09/13/2022 105  98 - 111 mmol/L Final   CO2 09/13/2022 21 (L)  22 - 32 mmol/L Final   Glucose, Bld 09/13/2022 111 (H)  70 - 99 mg/dL Final   Glucose reference range applies only to samples taken after fasting for at least 8 hours.   BUN 09/13/2022 14  6 - 20 mg/dL Final   Creatinine, Ser 09/13/2022 0.92  0.44 - 1.00 mg/dL Final   Calcium 97/11/6376 9.1  8.9 - 10.3 mg/dL Final   GFR, Estimated 09/13/2022 >60  >60 mL/min Final   Comment: (NOTE) Calculated using the CKD-EPI Creatinine Equation (2021)    Anion gap 09/13/2022 10  5 - 15 Final   Performed at [facility], [address], [place], [place] [address]   Vitamin B-12 09/13/2022 585  180 - 914 pg/mL Final   Comment: (NOTE) This assay is not validated for testing neonatal or myeloproliferative syndrome specimens for Vitamin B12 levels. Performed at [facility], [address], [place], [place] [address]    Color,  Urine 09/14/2022 YELLOW  YELLOW Final   APPearance 09/14/2022 HAZY (A)  CLEAR Final   Specific Gravity, Urine 09/14/2022 1.025  1.005 - 1.030 Final   pH 09/14/2022 5.0  5.0 - 8.0 Final   Glucose, UA 09/14/2022 NEGATIVE  NEGATIVE mg/dL Final   Hgb urine dipstick 09/14/2022 NEGATIVE  NEGATIVE Final   Bilirubin Urine 09/14/2022 NEGATIVE  NEGATIVE Final   Ketones, ur 09/14/2022 NEGATIVE  NEGATIVE mg/dL Final   Protein, ur 16/07/9603 NEGATIVE  NEGATIVE mg/dL Final   Nitrite 54/06/8118 NEGATIVE  NEGATIVE  Final   Leukocytes,Ua 09/14/2022 TRACE (A)  NEGATIVE Final   RBC / HPF 09/14/2022 0-5  0 - 5 RBC/hpf Final   WBC, UA 09/14/2022 0-5  0 - 5 WBC/hpf Final   Bacteria, UA 09/14/2022 RARE (A)  NONE SEEN Final   Squamous Epithelial / LPF 09/14/2022 0-5  0 - 5 Final   Mucus 09/14/2022 PRESENT   Final   Performed at [facility], [address], [place], [place] [address]   Glucose-Capillary 09/16/2022 105 (H)  70 - 99 mg/dL Final   Glucose reference range applies only to samples taken after fasting for at least 8 hours.  Admission on 07/04/2022, Discharged on 07/05/2022  Component Date Value Ref Range Status   Sodium 07/04/2022 142  135 - 145 mmol/L Final   Potassium 07/04/2022 4.4  3.5 - 5.1 mmol/L Final   Chloride 07/04/2022 109  98 - 111 mmol/L Final   CO2 07/04/2022 26  22 - 32 mmol/L Final   Glucose, Bld 07/04/2022 96  70 - 99 mg/dL Final   Glucose reference range applies only to samples taken after fasting for at least 8 hours.   BUN 07/04/2022 15  6 - 20 mg/dL Final   Creatinine, Ser 07/04/2022 0.83  0.44 - 1.00 mg/dL Final   Calcium 95/62/1308 9.3  8.9 - 10.3 mg/dL Final   Total Protein 65/78/4696 7.2  6.5 - 8.1 g/dL Final   Albumin 29/52/8413 3.9  3.5 - 5.0 g/dL Final   AST 24/40/1027 23  15 - 41 U/L Final   ALT 07/04/2022 28  0 - 44 U/L Final   Alkaline Phosphatase 07/04/2022 77  38 - 126 U/L Final   Total Bilirubin 07/04/2022 0.4  0.3 - 1.2 mg/dL Final   GFR, Estimated 07/04/2022 >60  >60 mL/min Final   Comment: (NOTE) Calculated using the CKD-EPI Creatinine Equation (2021)    Anion gap 07/04/2022 7  5 - 15 Final   Performed at [facility], [address] [place], [place] [address]   Alcohol, Ethyl (B) 07/04/2022 <10  <10 mg/dL Final   Comment: (NOTE) Lowest detectable limit for serum alcohol is 10 mg/dL.  For medical purposes only. Performed at [facility], [address] [place], [place] [address]    WBC 07/04/2022 7.0   4.0 - 10.5 K/uL Final   RBC 07/04/2022 4.18  3.87 - 5.11 MIL/uL Final   Hemoglobin 07/04/2022 12.8  12.0 - 15.0 g/dL Final   HCT 74/25/9563 39.1  36.0 - 46.0 % Final   MCV 07/04/2022 93.5  80.0 - 100.0 fL Final   MCH 07/04/2022 30.6  26.0 - 34.0 pg Final   MCHC 07/04/2022 32.7  30.0 - 36.0 g/dL Final   RDW 87/56/4332 12.9  11.5 - 15.5 % Final   Platelets 07/04/2022 243  150 - 400 K/uL Final   nRBC 07/04/2022 0.0  0.0 - 0.2 % Final   Neutrophils  Relative % 07/04/2022 43  % Final   Neutro Abs 07/04/2022 3.0  1.7 - 7.7 K/uL Final   Lymphocytes Relative 07/04/2022 50  % Final   Lymphs Abs 07/04/2022 3.5  0.7 - 4.0 K/uL Final   Monocytes Relative 07/04/2022 7  % Final   Monocytes Absolute 07/04/2022 0.5  0.1 - 1.0 K/uL Final   Eosinophils Relative 07/04/2022 0  % Final   Eosinophils Absolute 07/04/2022 0.0  0.0 - 0.5 K/uL Final   Basophils Relative 07/04/2022 0  % Final   Basophils Absolute 07/04/2022 0.0  0.0 - 0.1 K/uL Final   Immature Granulocytes 07/04/2022 0  % Final   Abs Immature Granulocytes 07/04/2022 0.01  0.00 - 0.07 K/uL Final   Performed at [facility], [address] [place], [place] [address]   I-stat hCG, quantitative 07/04/2022 <5.0  <5 mIU/mL Final   Comment 3 07/04/2022          Final   Comment:   GEST. AGE      CONC.  (mIU/mL)   <=1 WEEK        5 - 50     2 WEEKS       50 - 500     3 WEEKS       100 - 10,000     4 WEEKS     1,000 - 30,000        FEMALE AND NON-PREGNANT FEMALE:     LESS THAN 5 mIU/mL   Admission on 06/14/2022, Discharged on 06/14/2022  Component Date Value Ref Range Status   Color, UA 06/14/2022 yellow  yellow Final   Clarity, UA 06/14/2022 cloudy (A)  clear Final   Glucose, UA 06/14/2022 negative  negative mg/dL Final   Bilirubin, UA 53/66/440309/10/2021 negative  negative Final   Ketones, POC UA 06/14/2022 negative  negative mg/dL Final   Spec Grav, UA 47/42/595609/10/2021 1.025  1.010 - 1.025 Final   Blood, UA 06/14/2022 negative  negative  Final   pH, UA 06/14/2022 7.5  5.0 - 8.0 Final   Protein Ur, POC 06/14/2022 =30 (A)  negative mg/dL Final   Urobilinogen, UA 06/14/2022 0.2  0.2 or 1.0 E.U./dL Final   Nitrite, UA 38/75/643309/10/2021 Negative  Negative Final   Leukocytes, UA 06/14/2022 Negative  Negative Final   Preg Test, Ur 06/14/2022 Negative  Negative Final   Specimen Description 06/14/2022 URINE, CLEAN CATCH   Final   Special Requests 06/14/2022    Final                   Value:NONE Performed at [facility], [address] [place], [place] [address]    Culture 06/14/2022 MULTIPLE SPECIES PRESENT, SUGGEST RECOLLECTION (A)   Final   Report Status 06/14/2022 06/16/2022 FINAL   Final  Admission on 06/07/2022, Discharged on 06/10/2022  Component Date Value Ref Range Status   SARS Coronavirus 2 by RT PCR 06/07/2022 NEGATIVE  NEGATIVE Final   Comment: (NOTE) SARS-CoV-2 target nucleic acids are NOT DETECTED.  The SARS-CoV-2 RNA is generally detectable in upper respiratory specimens during the acute phase of infection. The lowest concentration of SARS-CoV-2 viral copies this assay can detect is 138 copies/mL. A negative result does not preclude SARS-Cov-2 infection and should not be used as the sole basis for treatment or other patient management decisions. A negative result may occur with  improper specimen collection/handling, submission of specimen other than nasopharyngeal swab, presence of viral mutation(s) within the areas targeted  by this assay, and inadequate number of viral copies(<138 copies/mL). A negative result must be combined with clinical observations, patient history, and epidemiological information. The expected result is Negative.  Fact Sheet for Patients:  BloggerCourse.com  Fact Sheet for Healthcare Providers:  SeriousBroker.it  This test is no                          t yet approved or cleared by the [place] FDA and  has been authorized  for detection and/or diagnosis of SARS-CoV-2 by FDA under an Emergency Use Authorization (EUA). This EUA will remain  in effect (meaning this test can be used) for the duration of the COVID-19 declaration under Section 564(b)(1) of the Act, 21 U.S.C.section 360bbb-3(b)(1), unless the authorization is terminated  or revoked sooner.       Influenza A by PCR 06/07/2022 NEGATIVE  NEGATIVE Final   Influenza B by PCR 06/07/2022 NEGATIVE  NEGATIVE Final   Comment: (NOTE) The Xpert Xpress SARS-CoV-2/FLU/RSV plus assay is intended as an aid in the diagnosis of influenza from Nasopharyngeal swab specimens and should not be used as a sole basis for treatment. Nasal washings and aspirates are unacceptable for Xpert Xpress SARS-CoV-2/FLU/RSV testing.  Fact Sheet for Patients: BloggerCourse.com  Fact Sheet for Healthcare Providers: SeriousBroker.it  This test is not yet approved or cleared by the [place] FDA and has been authorized for detection and/or diagnosis of SARS-CoV-2 by FDA under an Emergency Use Authorization (EUA). This EUA will remain in effect (meaning this test can be used) for the duration of the COVID-19 declaration under Section 564(b)(1) of the Act, 21 U.S.C. section 360bbb-3(b)(1), unless the authorization is terminated or revoked.  Performed at [facility], [address] [place], [place] [address]    WBC 06/07/2022 5.7  4.0 - 10.5 K/uL Final   RBC 06/07/2022 4.39  3.87 - 5.11 MIL/uL Final   Hemoglobin 06/07/2022 13.2  12.0 - 15.0 g/dL Final   HCT 53/66/4403 40.4  36.0 - 46.0 % Final   MCV 06/07/2022 92.0  80.0 - 100.0 fL Final   MCH 06/07/2022 30.1  26.0 - 34.0 pg Final   MCHC 06/07/2022 32.7  30.0 - 36.0 g/dL Final   RDW 47/42/5956 12.4  11.5 - 15.5 % Final   Platelets 06/07/2022 308  150 - 400 K/uL Final   nRBC 06/07/2022 0.0  0.0 - 0.2 % Final   Neutrophils Relative % 06/07/2022 42  % Final   Neutro  Abs 06/07/2022 2.4  1.7 - 7.7 K/uL Final   Lymphocytes Relative 06/07/2022 54  % Final   Lymphs Abs 06/07/2022 3.1  0.7 - 4.0 K/uL Final   Monocytes Relative 06/07/2022 4  % Final   Monocytes Absolute 06/07/2022 0.2  0.1 - 1.0 K/uL Final   Eosinophils Relative 06/07/2022 0  % Final   Eosinophils Absolute 06/07/2022 0.0  0.0 - 0.5 K/uL Final   Basophils Relative 06/07/2022 0  % Final   Basophils Absolute 06/07/2022 0.0  0.0 - 0.1 K/uL Final   Immature Granulocytes 06/07/2022 0  % Final   Abs Immature Granulocytes 06/07/2022 0.01  0.00 - 0.07 K/uL Final   Performed at [facility], [address], [place], [place] [address]   Sodium 06/07/2022 139  135 - 145 mmol/L Final   Potassium 06/07/2022 4.0  3.5 - 5.1 mmol/L Final   Chloride 06/07/2022 104  98 - 111 mmol/L Final   CO2  06/07/2022 28  22 - 32 mmol/L Final   Glucose, Bld 06/07/2022 104 (H)  70 - 99 mg/dL Final   Glucose reference range applies only to samples taken after fasting for at least 8 hours.   BUN 06/07/2022 8  6 - 20 mg/dL Final   Creatinine, Ser 06/07/2022 0.84  0.44 - 1.00 mg/dL Final   Calcium 16/07/9603 9.1  8.9 - 10.3 mg/dL Final   Total Protein 54/06/8118 6.9  6.5 - 8.1 g/dL Final   Albumin 14/78/2956 3.7  3.5 - 5.0 g/dL Final   AST 21/30/8657 19  15 - 41 U/L Final   ALT 06/07/2022 22  0 - 44 U/L Final   Alkaline Phosphatase 06/07/2022 54  38 - 126 U/L Final   Total Bilirubin 06/07/2022 0.4  0.3 - 1.2 mg/dL Final   GFR, Estimated 06/07/2022 >60  >60 mL/min Final   Comment: (NOTE) Calculated using the CKD-EPI Creatinine Equation (2021)    Anion gap 06/07/2022 7  5 - 15 Final   Performed at [facility], [address] [place], [place] [address]   Hgb A1c MFr Bld 06/07/2022 5.0  4.8 - 5.6 % Final   Comment: (NOTE) Pre diabetes:          5.7%-6.4%  Diabetes:              >6.4%  Glycemic control for   <7.0% adults with diabetes    Mean Plasma Glucose 06/07/2022 96.8  mg/dL Final   Performed at  [facility], [address], [place], [place] [address]   TSH 06/07/2022 1.620  0.350 - 4.500 uIU/mL Final   Comment: Performed by a 3rd Generation assay with a functional sensitivity of <=0.01 uIU/mL. Performed at [facility], [address], [place], [place] [address]    RPR Ser Ql 06/07/2022 NON REACTIVE  NON REACTIVE Final   Performed at [facility], [address] [place], [place] [address]   Color, Urine 06/07/2022 YELLOW  YELLOW Final   APPearance 06/07/2022 HAZY (A)  CLEAR Final   Specific Gravity, Urine 06/07/2022 1.018  1.005 - 1.030 Final   pH 06/07/2022 7.0  5.0 - 8.0 Final   Glucose, UA 06/07/2022 NEGATIVE  NEGATIVE mg/dL Final   Hgb urine dipstick 06/07/2022 NEGATIVE  NEGATIVE Final   Bilirubin Urine 06/07/2022 NEGATIVE  NEGATIVE Final   Ketones, ur 06/07/2022 NEGATIVE  NEGATIVE mg/dL Final   Protein, ur 72/53/6644 NEGATIVE  NEGATIVE mg/dL Final   Nitrite 03/47/4259 NEGATIVE  NEGATIVE Final   Leukocytes,Ua 06/07/2022 NEGATIVE  NEGATIVE Final   Performed at  [facility], [address] [place], [place] [address]   Cholesterol 06/07/2022 164  0 - 200 mg/dL Final   Triglycerides 56/43/3295 158 (H)  <150 mg/dL Final   HDL 18/84/1660 44  >40 mg/dL Final   Total CHOL/HDL Ratio 06/07/2022 3.7  RATIO Final   VLDL 06/07/2022 32  0 - 40 mg/dL Final   LDL Cholesterol 06/07/2022 88  0 - 99 mg/dL Final   Comment:        Total Cholesterol/HDL:CHD Risk Coronary Heart Disease Risk Table                     Men   Women  1/2 Average Risk   3.4   3.3  Average Risk       5.0   4.4  2 X Average Risk   9.6   7.1  3 X Average Risk  23.4   11.0        Use the calculated Patient Ratio above and the CHD Risk Table to determine the patient's CHD Risk.        ATP III CLASSIFICATION (LDL):  <100     mg/dL   Optimal  161-096  mg/dL   Near or Above                    Optimal  130-159  mg/dL   Borderline  045-409  mg/dL   High  >811     mg/dL   Very  High Performed at [facility], [address] [place], [place] [address]    HIV Screen 4th Generation wRfx 06/07/2022 Non Reactive  Non Reactive Final   Performed at [facility], [address] [place], [place] [address]   SARSCOV2ONAVIRUS 2 AG 06/07/2022 NEGATIVE  NEGATIVE Final   Comment: (NOTE) SARS-CoV-2 antigen NOT DETECTED.   Negative results are presumptive.  Negative results do not preclude SARS-CoV-2 infection and should not be used as the sole basis for treatment or other patient management decisions, including infection  control decisions, particularly in the presence of clinical signs and  symptoms consistent with COVID-19, or in those who have been in contact with the virus.  Negative results must be combined with clinical observations, patient history, and epidemiological information. The expected result is Negative.  Fact Sheet for Patients: https://www.jennings-kim.com/  Fact Sheet for Healthcare Providers: https://alexander-rogers.biz/  This test is not yet approved or cleared by the [place] FDA and  has been authorized for detection and/or diagnosis of SARS-CoV-2 by FDA under an Emergency Use Authorization (EUA).  This EUA will remain in effect (meaning this test can be used) for the duration of  the COV                          ID-19 declaration under Section 564(b)(1) of the Act, 21 U.S.C. section 360bbb-3(b)(1), unless the authorization is terminated or revoked sooner.     POC Amphetamine UR 06/07/2022 None Detected  NONE DETECTED (Cut Off Level 1000 ng/mL) Final   POC Secobarbital (BAR) 06/07/2022 None Detected  NONE DETECTED (Cut Off Level 300 ng/mL) Final   POC Buprenorphine (BUP) 06/07/2022 None Detected  NONE DETECTED (Cut Off Level 10 ng/mL) Final   POC Oxazepam (BZO) 06/07/2022 None Detected  NONE DETECTED (Cut Off Level 300 ng/mL) Final   POC Cocaine UR 06/07/2022 None Detected  NONE DETECTED (Cut Off  Level 300 ng/mL) Final   POC Methamphetamine UR 06/07/2022 None Detected  NONE DETECTED (Cut Off Level 1000 ng/mL) Final   POC Morphine 06/07/2022 None Detected  NONE DETECTED (Cut Off Level 300 ng/mL) Final   POC Methadone UR 06/07/2022 None Detected  NONE DETECTED (Cut Off Level 300 ng/mL) Final   POC Oxycodone UR 06/07/2022 None Detected  NONE DETECTED (Cut Off Level 100 ng/mL) Final   POC Marijuana UR 06/07/2022 None Detected  NONE DETECTED (Cut Off Level 50 ng/mL) Final   Preg Test, Ur 06/08/2022 NEGATIVE  NEGATIVE Final   Comment:        THE SENSITIVITY OF THIS METHODOLOGY IS >20 mIU/mL. Performed at [facility], [address] [place], [place] [address]    Preg Test, Ur 06/08/2022 NEGATIVE  NEGATIVE Final   Comment:        THE SENSITIVITY OF THIS METHODOLOGY IS >24  mIU/mL     Blood Alcohol level:  Lab Results  Component Value Date   ETH <10 07/04/2022   ETH <10 02/07/2022    Metabolic Disorder Labs: Lab Results  Component Value Date   HGBA1C 5.0 06/07/2022   MPG 96.8 06/07/2022   MPG 96.8 02/07/2022   No results found for: "PROLACTIN" Lab Results  Component Value Date   CHOL 181 07/25/2022   TRIG 118 07/25/2022   HDL 45 07/25/2022   CHOLHDL 4.0 07/25/2022   VLDL 24 07/25/2022   LDLCALC 112 (H) 07/25/2022   LDLCALC 88 06/07/2022    Therapeutic Lab Levels: No results found for: "LITHIUM" No results found for: "VALPROATE" No results found for: "CBMZ"  Physical Findings   GAD-7    Flowsheet Row Office Visit from 12/17/2021 in [facility]  Total GAD-7 Score 17      PHQ2-9    Flowsheet Row ED from 06/07/2022 in [facility] Office Visit from 12/17/2021 [facility]  PHQ-2 Total Score 1 2  PHQ-9 Total Score 2 8      Flowsheet Row ED from 07/25/2022 in [facility] ED from 07/04/2022 in [facility] from  06/14/2022 in [facility]   C-SSRS RISK CATEGORY No Risk No Risk No Risk        Musculoskeletal  Strength & Muscle Tone: within normal limits Gait & Station: normal Patient leans: N/A  Psychiatric Specialty Exam  Presentation  General Appearance:  Appropriate for Environment  Eye Contact: Good  Speech: Clear and Coherent; Normal Rate  Speech Volume: Normal  Handedness: Right   Mood and Affect  Mood: Euthymic  Affect: Appropriate; Congruent   Thought Process  Thought Processes: Coherent; Goal Directed  Descriptions of Associations:Intact  Orientation:Full (Time, Place and Person)  Thought Content:Logical  Diagnosis of Schizophrenia or Schizoaffective disorder in past: Yes  Duration of Psychotic Symptoms: Greater than six months   Hallucinations:Hallucinations: None  Ideas of Reference:None  Suicidal Thoughts:Suicidal Thoughts: No  Homicidal Thoughts:Homicidal Thoughts: No   Sensorium  Memory: Immediate Good; Recent Good; Remote Good  Judgment: Fair  Insight: Present   Executive Functions  Concentration: Fair  Attention Span: Fair  Recall: [employer] of Knowledge: Fair  Language: Fair   Psychomotor Activity  Psychomotor Activity: Psychomotor Activity: Normal   Assets  Assets: Communication Skills; Desire for Improvement; Financial Resources/Insurance   Sleep  Sleep: Sleep: Good Number of Hours of Sleep: 8   Nutritional Assessment (For OBS and FBC admissions only) Has the patient had a weight loss or gain of 10 pounds or more in the last 3 months?: No Does the patient have dental problems?: No Does the patient have eating habits or behaviors that may be indicators of an eating disorder including binging or inducing vomiting?: No Has the patient recently lost weight without trying?: 0 Has the patient been eating poorly because of a decreased appetite?: 0 Malnutrition Screening Tool  Score: 0    Physical Exam  Physical Exam Vitals and nursing note reviewed.  Constitutional:      General: She is not in acute distress.    Appearance: She is well-developed.  HENT:     Head: Normocephalic.  Eyes:     Conjunctiva/sclera: Conjunctivae normal.  Cardiovascular:     Rate and Rhythm: Tachycardia present.  Pulmonary:     Effort: Pulmonary effort is normal.  Breath sounds: Normal breath sounds.  Abdominal:     Palpations: Abdomen is soft.     Tenderness: There is no abdominal tenderness.  Musculoskeletal:        General: No swelling.     Cervical back: Neck supple.  Skin:    General: Skin is warm and moist.  Neurological:     Mental Status: She is alert.  Psychiatric:        Attention and Perception: Attention and perception normal.        Mood and Affect: Mood is anxious.        Speech: Speech normal.        Behavior: Behavior normal. Behavior is cooperative.        Thought Content: Thought content normal. Thought content is not paranoid or delusional. Thought content does not include homicidal or suicidal ideation.   Review of Systems  Constitutional:  Positive for chills and diaphoresis.  Eyes: Negative.   Respiratory:  Positive for cough and shortness of breath. Negative for wheezing.   Cardiovascular:  Chest pain: Reports feels like someone is sitting on her chest.  Genitourinary: Negative.   Psychiatric/Behavioral:  Negative for hallucinations, substance abuse and suicidal ideas. Depression: Stable.The patient does not have insomnia. Nervous/anxious: States some anxiousness today related to not feeling well.   Blood pressure 112/86, pulse (!) 101, temperature (!) 97.5 F (36.4 C), temperature source Oral, resp. rate 18, SpO2 100 %. There is no height or weight on file to calculate BMI.  Treatment Plan Summary: Disposition:  Patient continues to be psychiatrically cleared.  Social work/TOC and DSS continue to seek appropriate placement.    Ordered:   EKG, COVID    Heily Carlucci, NP 09/30/2022 10:42 AM

## 2022-09-30 NOTE — ED Notes (Signed)
Pt sleeping at present, no distress noted.  Monitoring for safety. 

## 2022-09-30 NOTE — ED Notes (Signed)
Patient resting quietly in bed with eyes closed, Respirations equal and unlabored, skin warm and dry, NAD. Routine safety checks conducted according to facility protocol. Will continue to monitor for safety. 

## 2022-09-30 NOTE — ED Notes (Signed)
Pt in the shower 

## 2022-09-30 NOTE — ED Provider Notes (Signed)
Nicole [name] had complaints of "feeling like someone is sitting on my chest, sweating, and chills this morning states she is feeling better.    Review of Systems  Constitutional: Negative.  Negative for chills, fever and malaise/fatigue.  HENT: Negative.  Negative for congestion, ear pain, sinus pain and sore throat.   Respiratory:  Negative for cough, shortness of breath and wheezing.   Cardiovascular: Negative.  Negative for chest pain and palpitations.  Musculoskeletal: Negative.  Negative for myalgias.  Skin: Negative.      Results: COVID:  Negative Resp Syncytial Virus by PCR:  Negative  EKG Interpretation Date/Time: 09/30/2022 at 10:20 AM    Ventricular Rate:  97 BPM    Normal sinus rhythm PR Interval  162 ms              Normal ECG QRS Duration:  66 ms           No previous ECGs available QT/QTcB 342/434 ms P-R-T axes 51 42 60          Recommendation:  No changes in medication regimen or treatment at this time.  Continue to monitor for chest pain, SOB.  If reoccur may need to send to ED   [name] B. Eulas Schweitzer, NP

## 2022-09-30 NOTE — ED Notes (Signed)
With Child psychotherapist

## 2022-10-01 LAB — BASIC METABOLIC PANEL
Anion gap: 5 (ref 5–15)
BUN: 12 mg/dL (ref 6–20)
CO2: 27 mmol/L (ref 22–32)
Calcium: 8.9 mg/dL (ref 8.9–10.3)
Chloride: 104 mmol/L (ref 98–111)
Creatinine, Ser: 0.98 mg/dL (ref 0.44–1.00)
GFR, Estimated: >60 mL/min (ref >60)
Glucose, Bld: 98 mg/dL (ref 70–99)
Potassium: 3.8 mmol/L (ref 3.5–5.1)
Sodium: 136 mmol/L (ref 135–145)

## 2022-10-01 LAB — CBC
HCT: 38.8 % (ref 36.0–46.0)
Hemoglobin: 12.7 g/dL (ref 12.0–15.0)
MCH: 29.5 pg (ref 26.0–34.0)
MCHC: 32.7 g/dL (ref 30.0–36.0)
MCV: 90 fL (ref 80.0–100.0)
Platelets: 300 10*3/uL (ref 150–400)
RBC: 4.31 MIL/uL (ref 3.87–5.11)
RDW: 12.4 % (ref 11.5–15.5)
WBC: 5.1 10*3/uL (ref 4.0–10.5)
nRBC: 0 % (ref 0.0–0.2)

## 2022-10-01 LAB — LIPASE, BLOOD: Lipase: 29 U/L (ref 11–51)

## 2022-10-01 MED ORDER — PANTOPRAZOLE SODIUM 40 MG PO TBEC
40.0000 mg | DELAYED_RELEASE_TABLET | Freq: Every day | ORAL | Status: DC
  Administered 2022-10-02 – 2022-10-07 (×6): 40 mg via ORAL
  Filled 2022-10-01 (×6): qty 1

## 2022-10-01 NOTE — ED Notes (Signed)
Patient observed/assessed in bed/chair resting quietly appearing in no distress and verbalizing no complaints at this time. Will continue to monitor.  

## 2022-10-01 NOTE — ED Provider Notes (Addendum)
Behavioral Health Progress Note  Date and Time: 10/01/2022 10:22 AM Name: Nicole [name] MRN:  [id number]  Per HPI: Nicole [name] is a 31 year old female with a past psychiatric history significant for schizophrenia and major depressive disorder with psychosis who voluntarily presented to [facility] on 07/25/2022 via [facility] after a verbal altercation with [name] (not patient's legal guardian) for transient SI. This is patient's fourth visit to [facility] for similar concerns this year. Patient has been dismissed from previous group home due to threatening suicidal and homicidal ideations, then leaving the group home. Legal Guardian: [name]) transitioning to be [name])   Subjective:   Nicole [name], 31 y.o., female patient seen face to face by this provider, consulted with Dr. Gretta Cool; and chart reviewed on 10/01/22.  On evaluation Nicole [name] reports she is still feeling somewhat bad compared to yesterday. She reports some dysphagia with associated vomiting. She also mentions abdominal pain that is colicky and radiates to back.  She continues to deny suicidal/self-harm/homicidal ideation, psychosis, and paranoia.  States that she has been eating/sleeping without difficulty and taking medications as ordered with no adverse reaction.  Patient asked if she would be leaving this week for placement and informed that I would check with social work to be sure.   During evaluation Nicole [name] is lying in bed wrapped up in blanket shivering.  She is alert, oriented x 4, calm, cooperative and attentive.  Her mood is anxious burt euthymic with congruent affect.  She has normal speech, and behavior.  Objectively there is no evidence of psychosis/mania or delusional thinking.  Patient is able to converse coherently, goal directed thoughts, no distractibility, or pre-occupation.  She denies suicidal/self-harm/homicidal ideation, psychosis, and paranoia.   Patient continues to be  psychiatrically cleared awaiting appropriate placement.  DSS and social work continue to work on finding appropriated placement for patient  Diagnosis:  Final diagnoses:  Severe episode of recurrent major depressive disorder, with psychotic features (HCC)    Total Time spent with patient: 20 minutes  Past Psychiatric History: schizoaffective, borderline IDD, anxiety, and MDD.       Past Medical History:  Past Medical History:  Diagnosis Date   Anxiety    Depression    Hypothyroidism 08/07/2022   Tobacco use disorder 08/07/2022    Past Surgical History:  Procedure Laterality Date   WISDOM TOOTH EXTRACTION Bilateral 2020   Family History:  Family History  Problem Relation Age of Onset   Hypertension Father    Diabetes Father    Family Psychiatric  History: None reported Social History:  Social History   Substance and Sexual Activity  Alcohol Use Never     Social History   Substance and Sexual Activity  Drug Use Never    Social History   Socioeconomic History   Marital status: Single    Spouse name: Not on file   Number of children: Not on file   Years of education: Not on file   Highest education level: Not on file  Occupational History   Not on file  Tobacco Use   Smoking status: Every Day    Types: Cigarettes   Smokeless tobacco: Never  Vaping Use   Vaping Use: Some days  Substance and Sexual Activity   Alcohol use: Never   Drug use: Never   Sexual activity: Yes    Partners: Female    Birth control/protection: Implant  Other Topics Concern   Not on file  Social History Narrative  Not on file   Social Determinants of Health   Financial Resource Strain: Not on file  Food Insecurity: Not on file  Transportation Needs: Not on file  Physical Activity: Not on file  Stress: Not on file  Social Connections: Not on file   SDOH:  SDOH Screenings   Depression (PHQ2-9): Low Risk  (06/10/2022)  Tobacco Use: High Risk (09/30/2022)   Additional  Social History:    Pain Medications: SEE MAR Prescriptions: SEE MAR Over the Counter: SEE MAR History of alcohol / drug use?: Yes Longest period of sobriety (when/how long): N/A Negative Consequences of Use:  (NONE) Withdrawal Symptoms: None Name of Substance 1: ETOH IN PAST--PT CURRENTLY DENIES 1 - Frequency: SOCIAL USE ETOH IN PAST 1 - Method of Aquiring: LEGAL 1- Route of Use: ORAL DRINK Name of Substance 2: NICOTINE-VAPE AND CIGARETTES 2 - Amount (size/oz): LESS THAN ONE PACK 2 - Frequency: DAILY 2 - Method of Aquiring: LEGAL 2 - Route of Substance Use: ORAL SMOKE                Sleep: Good  Appetite:  Good  Current Medications:  Current Facility-Administered Medications  Medication Dose Route Frequency Provider Last Rate Last Admin   acetaminophen (TYLENOL) tablet 650 mg  650 mg Oral Q6H PRN Onuoha, Chinwendu V, NP   650 mg at 09/21/22 2200   ARIPiprazole ER (ABILIFY MAINTENA) injection 400 mg  400 mg Intramuscular Q28 days Princess Bruins, DO   400 mg at 09/27/22 1445   docusate sodium (COLACE) capsule 100 mg  100 mg Oral Daily [name], MD   100 mg at 10/01/22 0856   hydrOXYzine (ATARAX) tablet 25 mg  25 mg Oral TID PRN Onuoha, Chinwendu V, NP   25 mg at 09/30/22 2117   ibuprofen (ADVIL) tablet 400 mg  400 mg Oral Q6H PRN Onuoha, Chinwendu V, NP   400 mg at 09/29/22 1942   levothyroxine (SYNTHROID) tablet 100 mcg  100 mcg Oral Q0600 Princess Bruins, DO   100 mcg at 10/01/22 0700   loratadine (CLARITIN) tablet 10 mg  10 mg Oral Daily Rankin, Shuvon B, NP   10 mg at 10/01/22 0856   metFORMIN (GLUCOPHAGE) tablet 500 mg  500 mg Oral Q breakfast Park Pope, MD   500 mg at 10/01/22 0856   nicotine (NICODERM CQ - dosed in mg/24 hours) patch 14 mg  14 mg Transdermal Q0600 Onuoha, Chinwendu V, NP   14 mg at 09/30/22 1038   ondansetron (ZOFRAN-ODT) disintegrating tablet 4 mg  4 mg Oral Q8H PRN Sindy Guadeloupe, NP   4 mg at 09/30/22 2336   Oxcarbazepine (TRILEPTAL) tablet 300  mg  300 mg Oral BID Onuoha, Chinwendu V, NP   300 mg at 10/01/22 0856   pantoprazole (PROTONIX) EC tablet 20 mg  20 mg Oral Daily Princess Bruins, DO   20 mg at 10/01/22 0855   polyethylene glycol (MIRALAX / GLYCOLAX) packet 17 g  17 g Oral Daily PRN Ajibola, Ene A, NP   17 g at 09/28/22 0225   QUEtiapine (SEROQUEL) tablet 400 mg  400 mg Oral BID Onuoha, Chinwendu V, NP   400 mg at 10/01/22 0856   sertraline (ZOLOFT) tablet 150 mg  150 mg Oral Daily Onuoha, Chinwendu V, NP   150 mg at 10/01/22 0856   simethicone (MYLICON) chewable tablet 80 mg  80 mg Oral Q6H PRN Sindy Guadeloupe, NP   80 mg at 09/29/22 2144   traZODone (DESYREL) tablet  100 mg  100 mg Oral QHS Vernard Gambles H, NP   100 mg at 09/30/22 2117   valACYclovir (VALTREX) tablet 500 mg  500 mg Oral Daily Princess Bruins, DO   500 mg at 10/01/22 0856   ziprasidone (GEODON) injection 20 mg  20 mg Intramuscular Q12H PRN Onuoha, Chinwendu V, NP   20 mg at 09/19/22 2133   Current Outpatient Medications  Medication Sig Dispense Refill   ABILIFY MAINTENA 400 MG PRSY prefilled syringe 400 mg every 28 (twenty-eight) days.     cetirizine (ZYRTEC) 10 MG tablet Take 10 mg by mouth daily.     cyclobenzaprine (FLEXERIL) 10 MG tablet Take 1 tablet (10 mg total) by mouth 2 (two) times daily as needed for muscle spasms. 20 tablet 0   fluticasone (FLONASE) 50 MCG/ACT nasal spray Place 1 spray into both nostrils daily.     meloxicam (MOBIC) 15 MG tablet Take 15 mg by mouth daily.     Oxcarbazepine (TRILEPTAL) 300 MG tablet Take 1 tablet (300 mg total) by mouth 2 (two) times daily. 60 tablet 0   QUEtiapine (SEROQUEL) 400 MG tablet Take 1 tablet (400 mg total) by mouth 2 (two) times daily. 60 tablet 0   sertraline (ZOLOFT) 50 MG tablet Take 3 tablets (150 mg total) by mouth in the morning. 90 tablet 0   traZODone (DESYREL) 100 MG tablet Take 1 tablet (100 mg total) by mouth at bedtime. 30 tablet 0   valACYclovir (VALTREX) 500 MG tablet Take 500 mg by mouth  daily.      Labs  Lab Results:  Admission on 07/25/2022  Component Date Value Ref Range Status   SARS Coronavirus 2 by RT PCR 07/25/2022 NEGATIVE  NEGATIVE Final   Comment: (NOTE) SARS-CoV-2 target nucleic acids are NOT DETECTED.  The SARS-CoV-2 RNA is generally detectable in upper respiratory specimens during the acute phase of infection. The lowest concentration of SARS-CoV-2 viral copies this assay can detect is 138 copies/mL. A negative result does not preclude SARS-Cov-2 infection and should not be used as the sole basis for treatment or other patient management decisions. A negative result may occur with  improper specimen collection/handling, submission of specimen other than nasopharyngeal swab, presence of viral mutation(s) within the areas targeted by this assay, and inadequate number of viral copies(<138 copies/mL). A negative result must be combined with clinical observations, patient history, and epidemiological information. The expected result is Negative.  Fact Sheet for Patients:  BloggerCourse.com  Fact Sheet for Healthcare Providers:  SeriousBroker.it  This test is no                          t yet approved or cleared by the [place] FDA and  has been authorized for detection and/or diagnosis of SARS-CoV-2 by FDA under an Emergency Use Authorization (EUA). This EUA will remain  in effect (meaning this test can be used) for the duration of the COVID-19 declaration under Section 564(b)(1) of the Act, 21 U.S.C.section 360bbb-3(b)(1), unless the authorization is terminated  or revoked sooner.       Influenza A by PCR 07/25/2022 NEGATIVE  NEGATIVE Final   Influenza B by PCR 07/25/2022 NEGATIVE  NEGATIVE Final   Comment: (NOTE) The Xpert Xpress SARS-CoV-2/FLU/RSV plus assay is intended as an aid in the diagnosis of influenza from Nasopharyngeal swab specimens and should not be used as a sole basis for  treatment. Nasal washings and aspirates are unacceptable for Xpert Xpress  SARS-CoV-2/FLU/RSV testing.  Fact Sheet for Patients: BloggerCourse.com  Fact Sheet for Healthcare Providers: SeriousBroker.it  This test is not yet approved or cleared by the [place] FDA and has been authorized for detection and/or diagnosis of SARS-CoV-2 by FDA under an Emergency Use Authorization (EUA). This EUA will remain in effect (meaning this test can be used) for the duration of the COVID-19 declaration under Section 564(b)(1) of the Act, 21 U.S.C. section 360bbb-3(b)(1), unless the authorization is terminated or revoked.  Performed at [facility], [address], [place], [place] [address]    WBC 07/25/2022 8.3  4.0 - 10.5 K/uL Final   RBC 07/25/2022 4.44  3.87 - 5.11 MIL/uL Final   Hemoglobin 07/25/2022 13.7  12.0 - 15.0 g/dL Final   HCT 60/45/4098 40.2  36.0 - 46.0 % Final   MCV 07/25/2022 90.5  80.0 - 100.0 fL Final   MCH 07/25/2022 30.9  26.0 - 34.0 pg Final   MCHC 07/25/2022 34.1  30.0 - 36.0 g/dL Final   RDW 11/91/4782 12.2  11.5 - 15.5 % Final   Platelets 07/25/2022 248  150 - 400 K/uL Final   nRBC 07/25/2022 0.0  0.0 - 0.2 % Final   Neutrophils Relative % 07/25/2022 43  % Final   Neutro Abs 07/25/2022 3.6  1.7 - 7.7 K/uL Final   Lymphocytes Relative 07/25/2022 52  % Final   Lymphs Abs 07/25/2022 4.2 (H)  0.7 - 4.0 K/uL Final   Monocytes Relative 07/25/2022 5  % Final   Monocytes Absolute 07/25/2022 0.4  0.1 - 1.0 K/uL Final   Eosinophils Relative 07/25/2022 0  % Final   Eosinophils Absolute 07/25/2022 0.0  0.0 - 0.5 K/uL Final   Basophils Relative 07/25/2022 0  % Final   Basophils Absolute 07/25/2022 0.0  0.0 - 0.1 K/uL Final   Immature Granulocytes 07/25/2022 0  % Final   Abs Immature Granulocytes 07/25/2022 0.02  0.00 - 0.07 K/uL Final   Performed at [facility], [address], [place], [place] [address]    Sodium 07/25/2022 138  135 - 145 mmol/L Final   Potassium 07/25/2022 4.0  3.5 - 5.1 mmol/L Final   Chloride 07/25/2022 104  98 - 111 mmol/L Final   CO2 07/25/2022 29  22 - 32 mmol/L Final   Glucose, Bld 07/25/2022 83  70 - 99 mg/dL Final   Glucose reference range applies only to samples taken after fasting for at least 8 hours.   BUN 07/25/2022 11  6 - 20 mg/dL Final   Creatinine, Ser 07/25/2022 0.97  0.44 - 1.00 mg/dL Final   Calcium 30/86/5784 9.2  8.9 - 10.3 mg/dL Final   Total Protein 69/62/9528 7.0  6.5 - 8.1 g/dL Final   Albumin 41/32/4401 3.8  3.5 - 5.0 g/dL Final   AST 02/72/5366 18  15 - 41 U/L Final   ALT 07/25/2022 22  0 - 44 U/L Final   Alkaline Phosphatase 07/25/2022 64  38 - 126 U/L Final   Total Bilirubin 07/25/2022 0.2 (L)  0.3 - 1.2 mg/dL Final   GFR, Estimated 07/25/2022 >60  >60 mL/min Final   Comment: (NOTE) Calculated using the CKD-EPI Creatinine Equation (2021)    Anion gap 07/25/2022 5  5 - 15 Final   Performed at [facility], [address] [place], [place] [address]   POC Amphetamine UR 07/25/2022 None Detected  NONE DETECTED (Cut Off Level 1000 ng/mL) Preliminary   POC Secobarbital (BAR)  07/25/2022 None Detected  NONE DETECTED (Cut Off Level 300 ng/mL) Preliminary   POC Buprenorphine (BUP) 07/25/2022 None Detected  NONE DETECTED (Cut Off Level 10 ng/mL) Preliminary   POC Oxazepam (BZO) 07/25/2022 None Detected  NONE DETECTED (Cut Off Level 300 ng/mL) Preliminary   POC Cocaine UR 07/25/2022 None Detected  NONE DETECTED (Cut Off Level 300 ng/mL) Preliminary   POC Methamphetamine UR 07/25/2022 None Detected  NONE DETECTED (Cut Off Level 1000 ng/mL) Preliminary   POC Morphine 07/25/2022 None Detected  NONE DETECTED (Cut Off Level 300 ng/mL) Preliminary   POC Methadone UR 07/25/2022 None Detected  NONE DETECTED (Cut Off Level 300 ng/mL) Preliminary   POC Oxycodone UR 07/25/2022 None Detected  NONE DETECTED (Cut Off Level 100 ng/mL) Preliminary   POC  Marijuana UR 07/25/2022 None Detected  NONE DETECTED (Cut Off Level 50 ng/mL) Preliminary   SARSCOV2ONAVIRUS 2 AG 07/25/2022 NEGATIVE  NEGATIVE Final   Comment: (NOTE) SARS-CoV-2 antigen NOT DETECTED.   Negative results are presumptive.  Negative results do not preclude SARS-CoV-2 infection and should not be used as the sole basis for treatment or other patient management decisions, including infection  control decisions, particularly in the presence of clinical signs and  symptoms consistent with COVID-19, or in those who have been in contact with the virus.  Negative results must be combined with clinical observations, patient history, and epidemiological information. The expected result is Negative.  Fact Sheet for Patients: https://www.jennings-kim.com/  Fact Sheet for Healthcare Providers: https://alexander-rogers.biz/  This test is not yet approved or cleared by the [place] FDA and  has been authorized for detection and/or diagnosis of SARS-CoV-2 by FDA under an Emergency Use Authorization (EUA).  This EUA will remain in effect (meaning this test can be used) for the duration of  the COV                          ID-19 declaration under Section 564(b)(1) of the Act, 21 U.S.C. section 360bbb-3(b)(1), unless the authorization is terminated or revoked sooner.     Cholesterol 07/25/2022 181  0 - 200 mg/dL Final   Triglycerides 04/54/0981 118  <150 mg/dL Final   HDL 19/14/7829 45  >40 mg/dL Final   Total CHOL/HDL Ratio 07/25/2022 4.0  RATIO Final   VLDL 07/25/2022 24  0 - 40 mg/dL Final   LDL Cholesterol 07/25/2022 112 (H)  0 - 99 mg/dL Final   Comment:        Total Cholesterol/HDL:CHD Risk Coronary Heart Disease Risk Table                     Men   Women  1/2 Average Risk   3.4   3.3  Average Risk       5.0   4.4  2 X Average Risk   9.6   7.1  3 X Average Risk  23.4   11.0        Use the calculated Patient Ratio above and the CHD Risk  Table to determine the patient's CHD Risk.        ATP III CLASSIFICATION (LDL):  <100     mg/dL   Optimal  562-130  mg/dL   Near or Above                    Optimal  130-159  mg/dL   Borderline  865-784  mg/dL   High  >696  mg/dL   Very High Performed at [facility], [address] [place], [place] [address]    TSH 07/25/2022 6.668 (H)  0.350 - 4.500 uIU/mL Final   Comment: Performed by a 3rd Generation assay with a functional sensitivity of <=0.01 uIU/mL. Performed at [facility], [address] [place], [place] [address]    Glucose-Capillary 07/26/2022 104 (H)  70 - 99 mg/dL Final   Glucose reference range applies only to samples taken after fasting for at least 8 hours.   T3, Free 07/31/2022 2.3  2.0 - 4.4 pg/mL Final   Comment: (NOTE) Performed At: [facility] [address] [place], [place] [id number] Jolene SchimkeNagendra Sanjai MD WG:[id number]:[phone]    Free T4 07/31/2022 0.60 (L)  0.61 - 1.12 ng/dL Final   Comment: (NOTE) Biotin ingestion may interfere with free T4 tests. If the results are inconsistent with the TSH level, previous test results, or the clinical presentation, then consider biotin interference. If needed, order repeat testing after stopping biotin. Performed at [facility], [address] [place], [place] [address]    Glucose-Capillary 08/29/2022 100 (H)  70 - 99 mg/dL Final   Glucose reference range applies only to samples taken after fasting for at least 8 hours.   TSH 09/05/2022 0.793  0.350 - 4.500 uIU/mL Final   Comment: Performed by a 3rd Generation assay with a functional sensitivity of <=0.01 uIU/mL. Performed at [facility], [address] [place], [place] [address]    Free T4 09/05/2022 0.73  0.61 - 1.12 ng/dL Final   Comment: (NOTE) Biotin ingestion may interfere with free T4 tests. If the results are inconsistent with the TSH level, previous test results, or the clinical presentation, then consider biotin  interference. If needed, order repeat testing after stopping biotin. Performed at [facility], [address] [place], [place] [address]    Preg Test, Ur 09/06/2022 Negative  Negative Final   Preg Test, Ur 09/06/2022 NEGATIVE  NEGATIVE Final   Comment:        THE SENSITIVITY OF THIS METHODOLOGY IS >24 mIU/mL    Preg Test, Ur 09/05/2022 NEGATIVE  NEGATIVE Final   Comment:        THE SENSITIVITY OF THIS METHODOLOGY IS >24 mIU/mL    Sodium 09/13/2022 136  135 - 145 mmol/L Final   Potassium 09/13/2022 4.5  3.5 - 5.1 mmol/L Final   Chloride 09/13/2022 105  98 - 111 mmol/L Final   CO2 09/13/2022 21 (L)  22 - 32 mmol/L Final   Glucose, Bld 09/13/2022 111 (H)  70 - 99 mg/dL Final   Glucose reference range applies only to samples taken after fasting for at least 8 hours.   BUN 09/13/2022 14  6 - 20 mg/dL Final   Creatinine, Ser 09/13/2022 0.92  0.44 - 1.00 mg/dL Final   Calcium 44/01/027212/10/2021 9.1  8.9 - 10.3 mg/dL Final   GFR, Estimated 09/13/2022 >60  >60 mL/min Final   Comment: (NOTE) Calculated using the CKD-EPI Creatinine Equation (2021)    Anion gap 09/13/2022 10  5 - 15 Final   Performed at [facility], [address] [place], [place] [address]   Vitamin B-12 09/13/2022 585  180 - 914 pg/mL Final   Comment: (NOTE) This assay is not validated for testing neonatal or myeloproliferative syndrome specimens for Vitamin B12 levels. Performed at [facility], [address] [place], [place] [address]    Color,  Urine 09/14/2022 YELLOW  YELLOW Final   APPearance 09/14/2022 HAZY (A)  CLEAR Final   Specific Gravity, Urine 09/14/2022 1.025  1.005 - 1.030 Final   pH 09/14/2022 5.0  5.0 - 8.0 Final   Glucose, UA 09/14/2022 NEGATIVE  NEGATIVE mg/dL Final   Hgb urine dipstick 09/14/2022 NEGATIVE  NEGATIVE Final   Bilirubin Urine 09/14/2022 NEGATIVE  NEGATIVE Final   Ketones, ur 09/14/2022 NEGATIVE  NEGATIVE mg/dL Final   Protein, ur 70/62/3762 NEGATIVE  NEGATIVE mg/dL  Final   Nitrite 83/15/1761 NEGATIVE  NEGATIVE Final   Leukocytes,Ua 09/14/2022 TRACE (A)  NEGATIVE Final   RBC / HPF 09/14/2022 0-5  0 - 5 RBC/hpf Final   WBC, UA 09/14/2022 0-5  0 - 5 WBC/hpf Final   Bacteria, UA 09/14/2022 RARE (A)  NONE SEEN Final   Squamous Epithelial / LPF 09/14/2022 0-5  0 - 5 Final   Mucus 09/14/2022 PRESENT   Final   Performed at [facility], [address] [place], [place] [address]   Glucose-Capillary 09/16/2022 105 (H)  70 - 99 mg/dL Final   Glucose reference range applies only to samples taken after fasting for at least 8 hours.   SARS Coronavirus 2 by RT PCR 09/30/2022 NEGATIVE  NEGATIVE Final   Comment: (NOTE) SARS-CoV-2 target nucleic acids are NOT DETECTED.  The SARS-CoV-2 RNA is generally detectable in upper respiratory specimens during the acute phase of infection. The lowest concentration of SARS-CoV-2 viral copies this assay can detect is 138 copies/mL. A negative result does not preclude SARS-Cov-2 infection and should not be used as the sole basis for treatment or other patient management decisions. A negative result may occur with  improper specimen collection/handling, submission of specimen other than nasopharyngeal swab, presence of viral mutation(s) within the areas targeted by this assay, and inadequate number of viral copies(<138 copies/mL). A negative result must be combined with clinical observations, patient history, and epidemiological information. The expected result is Negative.  Fact Sheet for Patients:  BloggerCourse.com  Fact Sheet for Healthcare Providers:  SeriousBroker.it  This test is no                          t yet approved or cleared by the [place] FDA and  has been authorized for detection and/or diagnosis of SARS-CoV-2 by FDA under an Emergency Use Authorization (EUA). This EUA will remain  in effect (meaning this test can be used) for the duration of  the COVID-19 declaration under Section 564(b)(1) of the Act, 21 U.S.C.section 360bbb-3(b)(1), unless the authorization is terminated  or revoked sooner.       Influenza A by PCR 09/30/2022 NEGATIVE  NEGATIVE Final   Influenza B by PCR 09/30/2022 NEGATIVE  NEGATIVE Final   Comment: (NOTE) The Xpert Xpress SARS-CoV-2/FLU/RSV plus assay is intended as an aid in the diagnosis of influenza from Nasopharyngeal swab specimens and should not be used as a sole basis for treatment. Nasal washings and aspirates are unacceptable for Xpert Xpress SARS-CoV-2/FLU/RSV testing.  Fact Sheet for Patients: BloggerCourse.com  Fact Sheet for Healthcare Providers: SeriousBroker.it  This test is not yet approved or cleared by the [place] FDA and has been authorized for detection and/or diagnosis of SARS-CoV-2 by FDA under an Emergency Use Authorization (EUA). This EUA will remain in effect (meaning this test can be used) for the duration of the COVID-19 declaration under Section 564(b)(1) of the Act, 21 U.S.C. section 360bbb-3(b)(1), unless the authorization  is terminated or revoked.     Resp Syncytial Virus by PCR 09/30/2022 NEGATIVE  NEGATIVE Final   Comment: (NOTE) Fact Sheet for Patients: BloggerCourse.com  Fact Sheet for Healthcare Providers: SeriousBroker.it  This test is not yet approved or cleared by the [place] FDA and has been authorized for detection and/or diagnosis of SARS-CoV-2 by FDA under an Emergency Use Authorization (EUA). This EUA will remain in effect (meaning this test can be used) for the duration of the COVID-19 declaration under Section 564(b)(1) of the Act, 21 U.S.C. section 360bbb-3(b)(1), unless the authorization is terminated or revoked.  Performed at [facility], [address] [place], [place] [address]   Admission on 07/04/2022, Discharged on  07/05/2022  Component Date Value Ref Range Status   Sodium 07/04/2022 142  135 - 145 mmol/L Final   Potassium 07/04/2022 4.4  3.5 - 5.1 mmol/L Final   Chloride 07/04/2022 109  98 - 111 mmol/L Final   CO2 07/04/2022 26  22 - 32 mmol/L Final   Glucose, Bld 07/04/2022 96  70 - 99 mg/dL Final   Glucose reference range applies only to samples taken after fasting for at least 8 hours.   BUN 07/04/2022 15  6 - 20 mg/dL Final   Creatinine, Ser 07/04/2022 0.83  0.44 - 1.00 mg/dL Final   Calcium 60/45/4098 9.3  8.9 - 10.3 mg/dL Final   Total Protein 11/91/4782 7.2  6.5 - 8.1 g/dL Final   Albumin 95/62/1308 3.9  3.5 - 5.0 g/dL Final   AST 65/78/4696 23  15 - 41 U/L Final   ALT 07/04/2022 28  0 - 44 U/L Final   Alkaline Phosphatase 07/04/2022 77  38 - 126 U/L Final   Total Bilirubin 07/04/2022 0.4  0.3 - 1.2 mg/dL Final   GFR, Estimated 07/04/2022 >60  >60 mL/min Final   Comment: (NOTE) Calculated using the CKD-EPI Creatinine Equation (2021)    Anion gap 07/04/2022 7  5 - 15 Final   Performed at [facility], [address] [place], [place] [address]   Alcohol, Ethyl (B) 07/04/2022 <10  <10 mg/dL Final   Comment: (NOTE) Lowest detectable limit for serum alcohol is 10 mg/dL.  For medical purposes only. Performed at [facility], [address], [place], [place] [address]    WBC 07/04/2022 7.0  4.0 - 10.5 K/uL Final   RBC 07/04/2022 4.18  3.87 - 5.11 MIL/uL Final   Hemoglobin 07/04/2022 12.8  12.0 - 15.0 g/dL Final   HCT 40/07/2724 39.1  36.0 - 46.0 % Final   MCV 07/04/2022 93.5  80.0 - 100.0 fL Final   MCH 07/04/2022 30.6  26.0 - 34.0 pg Final   MCHC 07/04/2022 32.7  30.0 - 36.0 g/dL Final   RDW 36/64/4034 12.9  11.5 - 15.5 % Final   Platelets 07/04/2022 243  150 - 400 K/uL Final   nRBC 07/04/2022 0.0  0.0 - 0.2 % Final   Neutrophils Relative % 07/04/2022 43  % Final   Neutro Abs 07/04/2022 3.0  1.7 - 7.7 K/uL Final   Lymphocytes Relative  07/04/2022 50  % Final   Lymphs Abs 07/04/2022 3.5  0.7 - 4.0 K/uL Final   Monocytes Relative 07/04/2022 7  % Final   Monocytes Absolute 07/04/2022 0.5  0.1 - 1.0 K/uL Final   Eosinophils Relative 07/04/2022 0  % Final   Eosinophils Absolute 07/04/2022 0.0  0.0 - 0.5 K/uL Final   Basophils Relative 07/04/2022  0  % Final   Basophils Absolute 07/04/2022 0.0  0.0 - 0.1 K/uL Final   Immature Granulocytes 07/04/2022 0  % Final   Abs Immature Granulocytes 07/04/2022 0.01  0.00 - 0.07 K/uL Final   Performed at [facility], [address] [place], [place] [address]   I-stat hCG, quantitative 07/04/2022 <5.0  <5 mIU/mL Final   Comment 3 07/04/2022          Final   Comment:   GEST. AGE      CONC.  (mIU/mL)   <=1 WEEK        5 - 50     2 WEEKS       50 - 500     3 WEEKS       100 - 10,000     4 WEEKS     1,000 - 30,000        FEMALE AND NON-PREGNANT FEMALE:     LESS THAN 5 mIU/mL   Admission on 06/14/2022, Discharged on 06/14/2022  Component Date Value Ref Range Status   Color, UA 06/14/2022 yellow  yellow Final   Clarity, UA 06/14/2022 cloudy (A)  clear Final   Glucose, UA 06/14/2022 negative  negative mg/dL Final   Bilirubin, UA 60/45/4098 negative  negative Final   Ketones, POC UA 06/14/2022 negative  negative mg/dL Final   Spec Grav, UA 11/91/4782 1.025  1.010 - 1.025 Final   Blood, UA 06/14/2022 negative  negative Final   pH, UA 06/14/2022 7.5  5.0 - 8.0 Final   Protein Ur, POC 06/14/2022 =30 (A)  negative mg/dL Final   Urobilinogen, UA 06/14/2022 0.2  0.2 or 1.0 E.U./dL Final   Nitrite, UA 95/62/1308 Negative  Negative Final   Leukocytes, UA 06/14/2022 Negative  Negative Final   Preg Test, Ur 06/14/2022 Negative  Negative Final   Specimen Description 06/14/2022 URINE, CLEAN CATCH   Final   Special Requests 06/14/2022    Final                   Value:NONE Performed at [facility], [address] [place], [place] [address]    Culture 06/14/2022 MULTIPLE  SPECIES PRESENT, SUGGEST RECOLLECTION (A)   Final   Report Status 06/14/2022 06/16/2022 FINAL   Final  Admission on 06/07/2022, Discharged on 06/10/2022  Component Date Value Ref Range Status   SARS Coronavirus 2 by RT PCR 06/07/2022 NEGATIVE  NEGATIVE Final   Comment: (NOTE) SARS-CoV-2 target nucleic acids are NOT DETECTED.  The SARS-CoV-2 RNA is generally detectable in upper respiratory specimens during the acute phase of infection. The lowest concentration of SARS-CoV-2 viral copies this assay can detect is 138 copies/mL. A negative result does not preclude SARS-Cov-2 infection and should not be used as the sole basis for treatment or other patient management decisions. A negative result may occur with  improper specimen collection/handling, submission of specimen other than nasopharyngeal swab, presence of viral mutation(s) within the areas targeted by this assay, and inadequate number of viral copies(<138 copies/mL). A negative result must be combined with clinical observations, patient history, and epidemiological information. The expected result is Negative.  Fact Sheet for Patients:  BloggerCourse.com  Fact Sheet for Healthcare Providers:  SeriousBroker.it  This test is no                          t yet approved or cleared by the [place] FDA and  has  been authorized for detection and/or diagnosis of SARS-CoV-2 by FDA under an Emergency Use Authorization (EUA). This EUA will remain  in effect (meaning this test can be used) for the duration of the COVID-19 declaration under Section 564(b)(1) of the Act, 21 U.S.C.section 360bbb-3(b)(1), unless the authorization is terminated  or revoked sooner.       Influenza A by PCR 06/07/2022 NEGATIVE  NEGATIVE Final   Influenza B by PCR 06/07/2022 NEGATIVE  NEGATIVE Final   Comment: (NOTE) The Xpert Xpress SARS-CoV-2/FLU/RSV plus assay is intended as an aid in the diagnosis of  influenza from Nasopharyngeal swab specimens and should not be used as a sole basis for treatment. Nasal washings and aspirates are unacceptable for Xpert Xpress SARS-CoV-2/FLU/RSV testing.  Fact Sheet for Patients: BloggerCourse.com  Fact Sheet for Healthcare Providers: SeriousBroker.it  This test is not yet approved or cleared by the [place] FDA and has been authorized for detection and/or diagnosis of SARS-CoV-2 by FDA under an Emergency Use Authorization (EUA). This EUA will remain in effect (meaning this test can be used) for the duration of the COVID-19 declaration under Section 564(b)(1) of the Act, 21 U.S.C. section 360bbb-3(b)(1), unless the authorization is terminated or revoked.  Performed at [facility], [address], [place], [place] [address]    WBC 06/07/2022 5.7  4.0 - 10.5 K/uL Final   RBC 06/07/2022 4.39  3.87 - 5.11 MIL/uL Final   Hemoglobin 06/07/2022 13.2  12.0 - 15.0 g/dL Final   HCT 54/06/8118 40.4  36.0 - 46.0 % Final   MCV 06/07/2022 92.0  80.0 - 100.0 fL Final   MCH 06/07/2022 30.1  26.0 - 34.0 pg Final   MCHC 06/07/2022 32.7  30.0 - 36.0 g/dL Final   RDW 14/78/2956 12.4  11.5 - 15.5 % Final   Platelets 06/07/2022 308  150 - 400 K/uL Final   nRBC 06/07/2022 0.0  0.0 - 0.2 % Final   Neutrophils Relative % 06/07/2022 42  % Final   Neutro Abs 06/07/2022 2.4  1.7 - 7.7 K/uL Final   Lymphocytes Relative 06/07/2022 54  % Final   Lymphs Abs 06/07/2022 3.1  0.7 - 4.0 K/uL Final   Monocytes Relative 06/07/2022 4  % Final   Monocytes Absolute 06/07/2022 0.2  0.1 - 1.0 K/uL Final   Eosinophils Relative 06/07/2022 0  % Final   Eosinophils Absolute 06/07/2022 0.0  0.0 - 0.5 K/uL Final   Basophils Relative 06/07/2022 0  % Final   Basophils Absolute 06/07/2022 0.0  0.0 - 0.1 K/uL Final   Immature Granulocytes 06/07/2022 0  % Final   Abs Immature Granulocytes 06/07/2022 0.01  0.00 - 0.07 K/uL Final    Performed at [facility], [address], [place], [place] [address]   Sodium 06/07/2022 139  135 - 145 mmol/L Final   Potassium 06/07/2022 4.0  3.5 - 5.1 mmol/L Final   Chloride 06/07/2022 104  98 - 111 mmol/L Final   CO2 06/07/2022 28  22 - 32 mmol/L Final   Glucose, Bld 06/07/2022 104 (H)  70 - 99 mg/dL Final   Glucose reference range applies only to samples taken after fasting for at least 8 hours.   BUN 06/07/2022 8  6 - 20 mg/dL Final   Creatinine, Ser 06/07/2022 0.84  0.44 - 1.00 mg/dL Final   Calcium 65/78/4696 9.1  8.9 - 10.3 mg/dL Final   Total Protein 29/52/8413 6.9  6.5 - 8.1 g/dL Final   Albumin  06/07/2022 3.7  3.5 - 5.0 g/dL Final   AST 16/07/9603 19  15 - 41 U/L Final   ALT 06/07/2022 22  0 - 44 U/L Final   Alkaline Phosphatase 06/07/2022 54  38 - 126 U/L Final   Total Bilirubin 06/07/2022 0.4  0.3 - 1.2 mg/dL Final   GFR, Estimated 06/07/2022 >60  >60 mL/min Final   Comment: (NOTE) Calculated using the CKD-EPI Creatinine Equation (2021)    Anion gap 06/07/2022 7  5 - 15 Final   Performed at [facility], [address], [place], [place] [address]   Hgb A1c MFr Bld 06/07/2022 5.0  4.8 - 5.6 % Final   Comment: (NOTE) Pre diabetes:          5.7%-6.4%  Diabetes:              >6.4%  Glycemic control for   <7.0% adults with diabetes    Mean Plasma Glucose 06/07/2022 96.8  mg/dL Final   Performed at [facility], [address], [place], [place] [address]   TSH 06/07/2022 1.620  0.350 - 4.500 uIU/mL Final   Comment: Performed by a 3rd Generation assay with a functional sensitivity of <=0.01 uIU/mL. Performed at [facility], [address] [place], [place] [address]    RPR Ser Ql 06/07/2022 NON REACTIVE  NON REACTIVE Final   Performed at [facility], [address] [place], [place] [address]   Color, Urine 06/07/2022 YELLOW  YELLOW Final   APPearance 06/07/2022 HAZY (A)  CLEAR Final   Specific Gravity, Urine 06/07/2022 1.018  1.005 -  1.030 Final   pH 06/07/2022 7.0  5.0 - 8.0 Final   Glucose, UA 06/07/2022 NEGATIVE  NEGATIVE mg/dL Final   Hgb urine dipstick 06/07/2022 NEGATIVE  NEGATIVE Final   Bilirubin Urine 06/07/2022 NEGATIVE  NEGATIVE Final   Ketones, ur 06/07/2022 NEGATIVE  NEGATIVE mg/dL Final   Protein, ur 86/57/8469 NEGATIVE  NEGATIVE mg/dL Final   Nitrite 62/95/2841 NEGATIVE  NEGATIVE Final   Leukocytes,Ua 06/07/2022 NEGATIVE  NEGATIVE Final   Performed at [facility], [address], [place], [place] [address]   Cholesterol 06/07/2022 164  0 - 200 mg/dL Final   Triglycerides 08/10/2535 158 (H)  <150 mg/dL Final   HDL 64/40/3474 44  >40 mg/dL Final   Total CHOL/HDL Ratio 06/07/2022 3.7  RATIO Final   VLDL 06/07/2022 32  0 - 40 mg/dL Final   LDL Cholesterol 06/07/2022 88  0 - 99 mg/dL Final   Comment:        Total Cholesterol/HDL:CHD Risk Coronary Heart Disease Risk Table                     Men   Women  1/2 Average Risk   3.4   3.3  Average Risk       5.0   4.4  2 X Average Risk   9.6   7.1  3 X Average Risk  23.4   11.0        Use the calculated Patient Ratio above and the CHD Risk Table to determine the patient's CHD Risk.        ATP III CLASSIFICATION (LDL):  <100     mg/dL   Optimal  259-563  mg/dL   Near or Above                    Optimal  130-159  mg/dL  Borderline  160-189  mg/dL   High  >161     mg/dL   Very High Performed at [facility], [address], [place], [place] [address]    HIV Screen 4th Generation wRfx 06/07/2022 Non Reactive  Non Reactive Final   Performed at [facility], [address] [place], [place] [address]   SARSCOV2ONAVIRUS 2 AG 06/07/2022 NEGATIVE  NEGATIVE Final   Comment: (NOTE) SARS-CoV-2 antigen NOT DETECTED.   Negative results are presumptive.  Negative results do not preclude SARS-CoV-2 infection and should not be used as the sole basis for treatment or other patient management decisions, including infection  control decisions,  particularly in the presence of clinical signs and  symptoms consistent with COVID-19, or in those who have been in contact with the virus.  Negative results must be combined with clinical observations, patient history, and epidemiological information. The expected result is Negative.  Fact Sheet for Patients: https://www.jennings-kim.com/  Fact Sheet for Healthcare Providers: https://alexander-rogers.biz/  This test is not yet approved or cleared by the [place] FDA and  has been authorized for detection and/or diagnosis of SARS-CoV-2 by FDA under an Emergency Use Authorization (EUA).  This EUA will remain in effect (meaning this test can be used) for the duration of  the COV                          ID-19 declaration under Section 564(b)(1) of the Act, 21 U.S.C. section 360bbb-3(b)(1), unless the authorization is terminated or revoked sooner.     POC Amphetamine UR 06/07/2022 None Detected  NONE DETECTED (Cut Off Level 1000 ng/mL) Final   POC Secobarbital (BAR) 06/07/2022 None Detected  NONE DETECTED (Cut Off Level 300 ng/mL) Final   POC Buprenorphine (BUP) 06/07/2022 None Detected  NONE DETECTED (Cut Off Level 10 ng/mL) Final   POC Oxazepam (BZO) 06/07/2022 None Detected  NONE DETECTED (Cut Off Level 300 ng/mL) Final   POC Cocaine UR 06/07/2022 None Detected  NONE DETECTED (Cut Off Level 300 ng/mL) Final   POC Methamphetamine UR 06/07/2022 None Detected  NONE DETECTED (Cut Off Level 1000 ng/mL) Final   POC Morphine 06/07/2022 None Detected  NONE DETECTED (Cut Off Level 300 ng/mL) Final   POC Methadone UR 06/07/2022 None Detected  NONE DETECTED (Cut Off Level 300 ng/mL) Final   POC Oxycodone UR 06/07/2022 None Detected  NONE DETECTED (Cut Off Level 100 ng/mL) Final   POC Marijuana UR 06/07/2022 None Detected  NONE DETECTED (Cut Off Level 50 ng/mL) Final   Preg Test, Ur 06/08/2022 NEGATIVE  NEGATIVE Final   Comment:        THE SENSITIVITY OF  THIS METHODOLOGY IS >20 mIU/mL. Performed at [facility], [address] [place], [place] [address]    Preg Test, Ur 06/08/2022 NEGATIVE  NEGATIVE Final   Comment:        THE SENSITIVITY OF THIS METHODOLOGY IS >24 mIU/mL     Blood Alcohol level:  Lab Results  Component Value Date   ETH <10 07/04/2022   ETH <10 02/07/2022    Metabolic Disorder Labs: Lab Results  Component Value Date   HGBA1C 5.0 06/07/2022   MPG 96.8 06/07/2022   MPG 96.8 02/07/2022   No results found for: "PROLACTIN" Lab Results  Component Value Date   CHOL 181 07/25/2022   TRIG 118 07/25/2022   HDL 45 07/25/2022   CHOLHDL 4.0 07/25/2022   VLDL 24  07/25/2022   LDLCALC 112 (H) 07/25/2022   LDLCALC 88 06/07/2022    Therapeutic Lab Levels: No results found for: "LITHIUM" No results found for: "VALPROATE" No results found for: "CBMZ"  Physical Findings   GAD-7    Flowsheet Row [facility]17/20[facility]  Total GAD-7 Score 17      PHQ2-9    Flowsheet Row ED from 06/07/2022 in [facility] Office Visit from 12/17/2021 [facility]  PHQ-2 Total Score 1 2  PHQ-9 Total Score 2 8      Flowsheet Row ED from 07/25/2022 in [facility] ED from 07/04/2022 in [facility] from 06/14/2022 in [facility]   C-SSRS RISK CATEGORY No Risk No Risk No Risk        Musculoskeletal  Strength & Muscle Tone: within normal limits Gait & Station: normal Patient leans: N/A  Psychiatric Specialty Exam  Presentation  General Appearance:  Appropriate for Environment  Eye Contact: Good  Speech: Clear and Coherent; Normal Rate  Speech Volume: Normal  Handedness: Right   Mood and Affect  Mood: Euthymic  Affect: Appropriate; Congruent   Thought Process  Thought Processes: Coherent; Goal  Directed  Descriptions of Associations:Intact  Orientation:Full (Time, Place and Person)  Thought Content:Logical  Diagnosis of Schizophrenia or Schizoaffective disorder in past: Yes  Duration of Psychotic Symptoms: Greater than six months   Hallucinations:Hallucinations: None  Ideas of Reference:None  Suicidal Thoughts:Suicidal Thoughts: No  Homicidal Thoughts:Homicidal Thoughts: No   Sensorium  Memory: Immediate Good; Recent Good; Remote Good  Judgment: Fair  Insight: Present   Executive Functions  Concentration: Fair  Attention Span: Fair  Recall: [employer] of Knowledge: Fair  Language: Fair   Psychomotor Activity  Psychomotor Activity: Psychomotor Activity: Normal   Assets  Assets: Communication Skills; Desire for Improvement; Financial Resources/Insurance   Sleep  Sleep: Sleep: Good   Nutritional Assessment (For OBS and FBC admissions only) Has the patient had a weight loss or gain of 10 pounds or more in the last 3 months?: No Does the patient have dental problems?: No Does the patient have eating habits or behaviors that may be indicators of an eating disorder including binging or inducing vomiting?: No Has the patient recently lost weight without trying?: 0 Has the patient been eating poorly because of a decreased appetite?: 0 Malnutrition Screening Tool Score: 0    Physical Exam  Physical Exam Vitals and nursing note reviewed.  Constitutional:      General: She is not in acute distress.    Appearance: She is well-developed.  HENT:     Head: Normocephalic.  Eyes:     Conjunctiva/sclera: Conjunctivae normal.  Cardiovascular:     Rate and Rhythm: Tachycardia present.  Pulmonary:     Effort: Pulmonary effort is normal.     Breath sounds: Normal breath sounds.  Abdominal:     Palpations: Abdomen is soft.     Tenderness: There is no abdominal tenderness.  Musculoskeletal:        General: No swelling.     Cervical back:  Neck supple.  Skin:    General: Skin is warm and moist.  Neurological:     Mental Status: She is alert.  Psychiatric:        Attention and Perception: Attention and perception normal.        Mood and  Affect: Mood is anxious.        Speech: Speech normal.        Behavior: Behavior normal. Behavior is cooperative.        Thought Content: Thought content normal. Thought content is not paranoid or delusional. Thought content does not include homicidal or suicidal ideation.    Review of Systems  Constitutional:  Positive for chills and diaphoresis.  Eyes: Negative.   Respiratory:  Positive for cough and shortness of breath. Negative for wheezing.   Cardiovascular:  Chest pain: Reports feels like someone is sitting on her chest.  Genitourinary: Negative.   Psychiatric/Behavioral:  Negative for hallucinations, substance abuse and suicidal ideas. Depression: Stable.The patient does not have insomnia. Nervous/anxious: States some anxiousness today related to not feeling well.   Blood pressure 99/73, pulse 100, temperature 97.9 F (36.6 C), temperature source Oral, resp. rate 16, SpO2 100 %. There is no height or weight on file to calculate BMI.  Treatment Plan Summary: Disposition:  Patient continues to be psychiatrically cleared.  Social work/TOC and DSS continue to seek appropriate placement.    COVID negative ECG unchanged from last ECG and wnl -Continue airborne precaution -If still uncomfortable tomorrow, can send to ED for CXR/Medical Clearance -Ordered CBC/BMP to assess for signs of inlammation -Ordered lipase to assess for pancreatitis given pain radiating to back  Park Pope, MD 10/01/2022 10:22 AM

## 2022-10-01 NOTE — ED Notes (Signed)
Patient observed/assessed on unit with patient sitting in bed watching TV. Patient alert and oriented x 4. Affect is 'flat.  Patient denies pain and anxiety. He denies A/V/H. He denies having any thoughts/plan of self harm and harm towards others. Fluid and snack offered. Patient states that appetite has been good throughout the day. Verbalizes no further complaints at this time. Will continue to monitor and support.  

## 2022-10-01 NOTE — ED Notes (Signed)
Patient observed/assessed on unit with patient sitting in bed watching TV. Patient alert and oriented x 4. Affect is 'flat.  Patient denies pain and anxiety. He denies A/V/H. He denies having any thoughts/plan of self harm and harm towards others. Fluid and snack offered. Patient states that appetite has been good throughout the day. Verbalizes no further complaints at this time. Will continue to monitor and support.

## 2022-10-01 NOTE — Progress Notes (Signed)
Pt resting quietly, appears in no acute distress. Respirations even and unlabored.

## 2022-10-01 NOTE — Progress Notes (Signed)
[name] Progress Note   [name] consulted with Dr. Gretta Cool regarding a letter endorsing an I/DD diagnosis before the age of 70.  Dr. Viviano Simas stated she would be willing to write a letter after reviewing the medical documentation existing for this pt prior to 2016 that dictates this diagnosis.  [name] sent an email to Ms. Helayne Seminole and Ms. Danetta Christmas (sp?) who are part of the I/DD section at [facility] asking for their assistance in obtaining this documentation.   Hansel Starling, MSW, [name] [facility] [phone] phone [phone] fax

## 2022-10-02 ENCOUNTER — Encounter: Payer: Self-pay | Admitting: Psychiatry

## 2022-10-02 NOTE — ED Notes (Signed)
Pt A&O x 4, taking shower at present, comfort measures given.  No distress noted, calm & cooperative.  Monitoring for safety.

## 2022-10-02 NOTE — ED Notes (Signed)
Patient observed/assessed in bed/chair resting quietly appearing in no distress and verbalizing no complaints at this time. Will continue to monitor.  

## 2022-10-02 NOTE — Progress Notes (Addendum)
Pt is awake, alert and oriented X4. No distress noted or concerns voiced. Administered scheduled meds with no issue. Pt denies current SI/HI/AVH, plan or intent. Staff will monitor for pt's safety.

## 2022-10-02 NOTE — ED Provider Notes (Signed)
Behavioral Health Progress Note  Date and Time: 10/02/2022 9:29 AM Name: Nicole [name] MRN:  [id number]  Subjective: Patient states "I might be getting out of here on Friday."  Patient is reassessed, face-to-face, by nurse practitioner.  She is seated in observation area upon my approach, eating cereal for breakfast.  She is alert and oriented, pleasant and cooperative during assessment.  She presents with bright mood, congruent affect.  Zema endorses feeling excited regarding potential placement.  She is looking forward to discharge from good [facility] behavioral health.  She continues to deny suicidal and homicidal ideation.  She denies auditory and visual hallucinations.  There is no evidence of delusional thought content and no indication that patient is responding to internal stimuli.  Patient is pleasant and cooperative at [facility] behavioral health.  She is compliant with medications.  She is engaged with staff and peers.  She endorses average sleep and appetite.  Patient offered support and encouragement.  She verbalizes understanding of treatment plan.  Nicole [name] is a 31 y.o. female, with PMH of SCZ unspecified, MDD with psychosis, who presented voluntary to [facility] (07/25/2022) via [facility] after a verbal altercation with [name] (not patient's legal guardian) for transient SI.  This is patient's fourth visit to [facility] for similar concerns this year. Patient has been dismissed from previous group home due to threatening SI and HI, then leaving the group home.   Legal Guardian: Nicole [name]) transitioning to be Nicole [name]) Point of contact: Nicole [name])  Diagnosis:  Final diagnoses:  Severe episode of recurrent major depressive disorder, with psychotic features (HCC)    Total Time spent with patient: 20 minutes  Past Psychiatric History: see H&P Past Medical History:  Past Medical History:  Diagnosis Date   Anxiety    Depression     Hypothyroidism 08/07/2022   Tobacco use disorder 08/07/2022    Past Surgical History:  Procedure Laterality Date   WISDOM TOOTH EXTRACTION Bilateral 2020   Family History:  Family History  Problem Relation Age of Onset   Hypertension Father    Diabetes Father    Family Psychiatric  History: see H&P Social History:  Social History   Substance and Sexual Activity  Alcohol Use Never     Social History   Substance and Sexual Activity  Drug Use Never    Social History   Socioeconomic History   Marital status: Single    Spouse name: Not on file   Number of children: Not on file   Years of education: Not on file   Highest education level: Not on file  Occupational History   Not on file  Tobacco Use   Smoking status: Every Day    Types: Cigarettes   Smokeless tobacco: Never  Vaping Use   Vaping Use: Some days  Substance and Sexual Activity   Alcohol use: Never   Drug use: Never   Sexual activity: Yes    Partners: Female    Birth control/protection: Implant  Other Topics Concern   Not on file  Social History Narrative   Not on file   Social Determinants of Health   Financial Resource Strain: Not on file  Food Insecurity: Not on file  Transportation Needs: Not on file  Physical Activity: Not on file  Stress: Not on file  Social Connections: Not on file   SDOH:  SDOH Screenings   Depression (PHQ2-9): Low Risk  (06/10/2022)  Tobacco Use: High Risk (09/30/2022)   Additional Social  History:    Pain Medications: SEE MAR Prescriptions: SEE MAR Over the Counter: SEE MAR History of alcohol / drug use?: Yes Longest period of sobriety (when/how long): N/A Negative Consequences of Use:  (NONE) Withdrawal Symptoms: None Name of Substance 1: ETOH IN PAST--PT CURRENTLY DENIES 1 - Frequency: SOCIAL USE ETOH IN PAST 1 - Method of Aquiring: LEGAL 1- Route of Use: ORAL DRINK Name of Substance 2: NICOTINE-VAPE AND CIGARETTES 2 - Amount (size/oz): LESS THAN ONE  PACK 2 - Frequency: DAILY 2 - Method of Aquiring: LEGAL 2 - Route of Substance Use: ORAL SMOKE                Sleep: Good  Appetite:  Good  Current Medications:  Current Facility-Administered Medications  Medication Dose Route Frequency Provider Last Rate Last Admin   acetaminophen (TYLENOL) tablet 650 mg  650 mg Oral Q6H PRN Onuoha, Chinwendu V, NP   650 mg at 09/21/22 2200   ARIPiprazole ER (ABILIFY MAINTENA) injection 400 mg  400 mg Intramuscular Q28 days Princess Bruins, DO   400 mg at 09/27/22 1445   docusate sodium (COLACE) capsule 100 mg  100 mg Oral Daily [name], MD   100 mg at 10/02/22 0836   hydrOXYzine (ATARAX) tablet 25 mg  25 mg Oral TID PRN Onuoha, Chinwendu V, NP   25 mg at 10/01/22 2115   ibuprofen (ADVIL) tablet 400 mg  400 mg Oral Q6H PRN Onuoha, Chinwendu V, NP   400 mg at 09/29/22 1942   levothyroxine (SYNTHROID) tablet 100 mcg  100 mcg Oral Q0600 Princess Bruins, DO   100 mcg at 10/02/22 0612   loratadine (CLARITIN) tablet 10 mg  10 mg Oral Daily Rankin, Shuvon B, NP   10 mg at 10/02/22 0836   metFORMIN (GLUCOPHAGE) tablet 500 mg  500 mg Oral Q breakfast Park Pope, MD   500 mg at 10/02/22 0836   nicotine (NICODERM CQ - dosed in mg/24 hours) patch 14 mg  14 mg Transdermal Q0600 Onuoha, Chinwendu V, NP   14 mg at 09/30/22 1038   ondansetron (ZOFRAN-ODT) disintegrating tablet 4 mg  4 mg Oral Q8H PRN Sindy Guadeloupe, NP   4 mg at 09/30/22 2336   Oxcarbazepine (TRILEPTAL) tablet 300 mg  300 mg Oral BID Onuoha, Chinwendu V, NP   300 mg at 10/02/22 0836   pantoprazole (PROTONIX) EC tablet 40 mg  40 mg Oral Daily Park Pope, MD   40 mg at 10/02/22 0837   polyethylene glycol (MIRALAX / GLYCOLAX) packet 17 g  17 g Oral Daily PRN Ajibola, Ene A, NP   17 g at 09/28/22 0225   QUEtiapine (SEROQUEL) tablet 400 mg  400 mg Oral BID Onuoha, Chinwendu V, NP   400 mg at 10/02/22 0836   sertraline (ZOLOFT) tablet 150 mg  150 mg Oral Daily Onuoha, Chinwendu V, NP   150 mg at  10/02/22 0836   simethicone (MYLICON) chewable tablet 80 mg  80 mg Oral Q6H PRN Sindy Guadeloupe, NP   80 mg at 09/29/22 2144   traZODone (DESYREL) tablet 100 mg  100 mg Oral QHS Ardis Hughs, NP   100 mg at 10/01/22 2116   valACYclovir (VALTREX) tablet 500 mg  500 mg Oral Daily Princess Bruins, DO   500 mg at 10/02/22 0836   ziprasidone (GEODON) injection 20 mg  20 mg Intramuscular Q12H PRN Onuoha, Chinwendu V, NP   20 mg at 09/19/22 2133   Current Outpatient Medications  Medication Sig Dispense Refill   ABILIFY MAINTENA 400 MG PRSY prefilled syringe 400 mg every 28 (twenty-eight) days.     cetirizine (ZYRTEC) 10 MG tablet Take 10 mg by mouth daily.     cyclobenzaprine (FLEXERIL) 10 MG tablet Take 1 tablet (10 mg total) by mouth 2 (two) times daily as needed for muscle spasms. 20 tablet 0   fluticasone (FLONASE) 50 MCG/ACT nasal spray Place 1 spray into both nostrils daily.     meloxicam (MOBIC) 15 MG tablet Take 15 mg by mouth daily.     Oxcarbazepine (TRILEPTAL) 300 MG tablet Take 1 tablet (300 mg total) by mouth 2 (two) times daily. 60 tablet 0   QUEtiapine (SEROQUEL) 400 MG tablet Take 1 tablet (400 mg total) by mouth 2 (two) times daily. 60 tablet 0   sertraline (ZOLOFT) 50 MG tablet Take 3 tablets (150 mg total) by mouth in the morning. 90 tablet 0   traZODone (DESYREL) 100 MG tablet Take 1 tablet (100 mg total) by mouth at bedtime. 30 tablet 0   valACYclovir (VALTREX) 500 MG tablet Take 500 mg by mouth daily.      Labs  Lab Results:  Admission on 07/25/2022  Component Date Value Ref Range Status   SARS Coronavirus 2 by RT PCR 07/25/2022 NEGATIVE  NEGATIVE Final   Comment: (NOTE) SARS-CoV-2 target nucleic acids are NOT DETECTED.  The SARS-CoV-2 RNA is generally detectable in upper respiratory specimens during the acute phase of infection. The lowest concentration of SARS-CoV-2 viral copies this assay can detect is 138 copies/mL. A negative result does not preclude  SARS-Cov-2 infection and should not be used as the sole basis for treatment or other patient management decisions. A negative result may occur with  improper specimen collection/handling, submission of specimen other than nasopharyngeal swab, presence of viral mutation(s) within the areas targeted by this assay, and inadequate number of viral copies(<138 copies/mL). A negative result must be combined with clinical observations, patient history, and epidemiological information. The expected result is Negative.  Fact Sheet for Patients:  BloggerCourse.com  Fact Sheet for Healthcare Providers:  SeriousBroker.it  This test is no                          t yet approved or cleared by the [place] FDA and  has been authorized for detection and/or diagnosis of SARS-CoV-2 by FDA under an Emergency Use Authorization (EUA). This EUA will remain  in effect (meaning this test can be used) for the duration of the COVID-19 declaration under Section 564(b)(1) of the Act, 21 U.S.C.section 360bbb-3(b)(1), unless the authorization is terminated  or revoked sooner.       Influenza A by PCR 07/25/2022 NEGATIVE  NEGATIVE Final   Influenza B by PCR 07/25/2022 NEGATIVE  NEGATIVE Final   Comment: (NOTE) The Xpert Xpress SARS-CoV-2/FLU/RSV plus assay is intended as an aid in the diagnosis of influenza from Nasopharyngeal swab specimens and should not be used as a sole basis for treatment. Nasal washings and aspirates are unacceptable for Xpert Xpress SARS-CoV-2/FLU/RSV testing.  Fact Sheet for Patients: BloggerCourse.com  Fact Sheet for Healthcare Providers: SeriousBroker.it  This test is not yet approved or cleared by the [place] FDA and has been authorized for detection and/or diagnosis of SARS-CoV-2 by FDA under an Emergency Use Authorization (EUA). This EUA will remain in effect  (meaning this test can be used) for the duration of the COVID-19 declaration under Section 564(b)(1) of  the Act, 21 U.S.C. section 360bbb-3(b)(1), unless the authorization is terminated or revoked.  Performed at [facility], [address] [place], [place] [address]    WBC 07/25/2022 8.3  4.0 - 10.5 K/uL Final   RBC 07/25/2022 4.44  3.87 - 5.11 MIL/uL Final   Hemoglobin 07/25/2022 13.7  12.0 - 15.0 g/dL Final   HCT 62/70/3500 40.2  36.0 - 46.0 % Final   MCV 07/25/2022 90.5  80.0 - 100.0 fL Final   MCH 07/25/2022 30.9  26.0 - 34.0 pg Final   MCHC 07/25/2022 34.1  30.0 - 36.0 g/dL Final   RDW 93/81/8299 12.2  11.5 - 15.5 % Final   Platelets 07/25/2022 248  150 - 400 K/uL Final   nRBC 07/25/2022 0.0  0.0 - 0.2 % Final   Neutrophils Relative % 07/25/2022 43  % Final   Neutro Abs 07/25/2022 3.6  1.7 - 7.7 K/uL Final   Lymphocytes Relative 07/25/2022 52  % Final   Lymphs Abs 07/25/2022 4.2 (H)  0.7 - 4.0 K/uL Final   Monocytes Relative 07/25/2022 5  % Final   Monocytes Absolute 07/25/2022 0.4  0.1 - 1.0 K/uL Final   Eosinophils Relative 07/25/2022 0  % Final   Eosinophils Absolute 07/25/2022 0.0  0.0 - 0.5 K/uL Final   Basophils Relative 07/25/2022 0  % Final   Basophils Absolute 07/25/2022 0.0  0.0 - 0.1 K/uL Final   Immature Granulocytes 07/25/2022 0  % Final   Abs Immature Granulocytes 07/25/2022 0.02  0.00 - 0.07 K/uL Final   Performed at [facility], [address] [place], [place] [address]   Sodium 07/25/2022 138  135 - 145 mmol/L Final   Potassium 07/25/2022 4.0  3.5 - 5.1 mmol/L Final   Chloride 07/25/2022 104  98 - 111 mmol/L Final   CO2 07/25/2022 29  22 - 32 mmol/L Final   Glucose, Bld 07/25/2022 83  70 - 99 mg/dL Final   Glucose reference range applies only to samples taken after fasting for at least 8 hours.   BUN 07/25/2022 11  6 - 20 mg/dL Final   Creatinine, Ser 07/25/2022 0.97  0.44 - 1.00 mg/dL Final   Calcium 67/89/3810 9.2  8.9 - 10.3 mg/dL Final    Total Protein 07/25/2022 7.0  6.5 - 8.1 g/dL Final   Albumin 17/51/0258 3.8  3.5 - 5.0 g/dL Final   AST 52/77/8242 18  15 - 41 U/L Final   ALT 07/25/2022 22  0 - 44 U/L Final   Alkaline Phosphatase 07/25/2022 64  38 - 126 U/L Final   Total Bilirubin 07/25/2022 0.2 (L)  0.3 - 1.2 mg/dL Final   GFR, Estimated 07/25/2022 >60  >60 mL/min Final   Comment: (NOTE) Calculated using the CKD-EPI Creatinine Equation (2021)    Anion gap 07/25/2022 5  5 - 15 Final   Performed at [facility], [address], [place], [place] [address]   POC Amphetamine UR 07/25/2022 None Detected  NONE DETECTED (Cut Off Level 1000 ng/mL) Preliminary   POC Secobarbital (BAR) 07/25/2022 None Detected  NONE DETECTED (Cut Off Level 300 ng/mL) Preliminary   POC Buprenorphine (BUP) 07/25/2022 None Detected  NONE DETECTED (Cut Off Level 10 ng/mL) Preliminary   POC Oxazepam (BZO) 07/25/2022 None Detected  NONE DETECTED (Cut Off Level 300 ng/mL) Preliminary   POC Cocaine UR 07/25/2022 None Detected  NONE DETECTED (Cut Off Level 300 ng/mL) Preliminary   POC Methamphetamine UR 07/25/2022 None  Detected  NONE DETECTED (Cut Off Level 1000 ng/mL) Preliminary   POC Morphine 07/25/2022 None Detected  NONE DETECTED (Cut Off Level 300 ng/mL) Preliminary   POC Methadone UR 07/25/2022 None Detected  NONE DETECTED (Cut Off Level 300 ng/mL) Preliminary   POC Oxycodone UR 07/25/2022 None Detected  NONE DETECTED (Cut Off Level 100 ng/mL) Preliminary   POC Marijuana UR 07/25/2022 None Detected  NONE DETECTED (Cut Off Level 50 ng/mL) Preliminary   SARSCOV2ONAVIRUS 2 AG 07/25/2022 NEGATIVE  NEGATIVE Final   Comment: (NOTE) SARS-CoV-2 antigen NOT DETECTED.   Negative results are presumptive.  Negative results do not preclude SARS-CoV-2 infection and should not be used as the sole basis for treatment or other patient management decisions, including infection  control decisions, particularly in the presence of clinical signs and   symptoms consistent with COVID-19, or in those who have been in contact with the virus.  Negative results must be combined with clinical observations, patient history, and epidemiological information. The expected result is Negative.  Fact Sheet for Patients: https://www.jennings-kim.com/  Fact Sheet for Healthcare Providers: https://alexander-rogers.biz/  This test is not yet approved or cleared by the [place] FDA and  has been authorized for detection and/or diagnosis of SARS-CoV-2 by FDA under an Emergency Use Authorization (EUA).  This EUA will remain in effect (meaning this test can be used) for the duration of  the COV                          ID-19 declaration under Section 564(b)(1) of the Act, 21 U.S.C. section 360bbb-3(b)(1), unless the authorization is terminated or revoked sooner.     Cholesterol 07/25/2022 181  0 - 200 mg/dL Final   Triglycerides 16/07/9603 118  <150 mg/dL Final   HDL 54/06/8118 45  >40 mg/dL Final   Total CHOL/HDL Ratio 07/25/2022 4.0  RATIO Final   VLDL 07/25/2022 24  0 - 40 mg/dL Final   LDL Cholesterol 07/25/2022 112 (H)  0 - 99 mg/dL Final   Comment:        Total Cholesterol/HDL:CHD Risk Coronary Heart Disease Risk Table                     Men   Women  1/2 Average Risk   3.4   3.3  Average Risk       5.0   4.4  2 X Average Risk   9.6   7.1  3 X Average Risk  23.4   11.0        Use the calculated Patient Ratio above and the CHD Risk Table to determine the patient's CHD Risk.        ATP III CLASSIFICATION (LDL):  <100     mg/dL   Optimal  147-829  mg/dL   Near or Above                    Optimal  130-159  mg/dL   Borderline  562-130  mg/dL   High  >865     mg/dL   Very High Performed at [facility], [address], [place], [place] [address]    TSH 07/25/2022 6.668 (H)  0.350 - 4.500 uIU/mL Final   Comment: Performed by a 3rd Generation assay with a functional sensitivity of <=0.01  uIU/mL. Performed at [facility], [address] [place], [place] [address]    Glucose-Capillary 07/26/2022 104 (H)  70 -  99 mg/dL Final   Glucose reference range applies only to samples taken after fasting for at least 8 hours.   T3, Free 07/31/2022 2.3  2.0 - 4.4 pg/mL Final   Comment: (NOTE) Performed At: [facility] [address] [place], [place] [id number] Jolene Schimke MD WU:[id number]    Free T4 07/31/2022 0.60 (L)  0.61 - 1.12 ng/dL Final   Comment: (NOTE) Biotin ingestion may interfere with free T4 tests. If the results are inconsistent with the TSH level, previous test results, or the clinical presentation, then consider biotin interference. If needed, order repeat testing after stopping biotin. Performed at [facility], [address] [place], [place] [address]    Glucose-Capillary 08/29/2022 100 (H)  70 - 99 mg/dL Final   Glucose reference range applies only to samples taken after fasting for at least 8 hours.   TSH 09/05/2022 0.793  0.350 - 4.500 uIU/mL Final   Comment: Performed by a 3rd Generation assay with a functional sensitivity of <=0.01 uIU/mL. Performed at [facility], [address], [place], [place] [address]    Free T4 09/05/2022 0.73  0.61 - 1.12 ng/dL Final   Comment: (NOTE) Biotin ingestion may interfere with free T4 tests. If the results are inconsistent with the TSH level, previous test results, or the clinical presentation, then consider biotin interference. If needed, order repeat testing after stopping biotin. Performed at [facility], [address], [place], [place] [address]    Preg Test, Ur 09/06/2022 Negative  Negative Final   Preg Test, Ur 09/06/2022 NEGATIVE  NEGATIVE Final   Comment:        THE SENSITIVITY OF THIS METHODOLOGY IS >24 mIU/mL    Preg Test, Ur 09/05/2022 NEGATIVE  NEGATIVE Final   Comment:        THE SENSITIVITY OF THIS METHODOLOGY IS >24 mIU/mL    Sodium 09/13/2022 136  135 -  145 mmol/L Final   Potassium 09/13/2022 4.5  3.5 - 5.1 mmol/L Final   Chloride 09/13/2022 105  98 - 111 mmol/L Final   CO2 09/13/2022 21 (L)  22 - 32 mmol/L Final   Glucose, Bld 09/13/2022 111 (H)  70 - 99 mg/dL Final   Glucose reference range applies only to samples taken after fasting for at least 8 hours.   BUN 09/13/2022 14  6 - 20 mg/dL Final   Creatinine, Ser 09/13/2022 0.92  0.44 - 1.00 mg/dL Final   Calcium 62/95/2841 9.1  8.9 - 10.3 mg/dL Final   GFR, Estimated 09/13/2022 >60  >60 mL/min Final   Comment: (NOTE) Calculated using the CKD-EPI Creatinine Equation (2021)    Anion gap 09/13/2022 10  5 - 15 Final   Performed at [facility], [address] [place], [place] [address]   Vitamin B-12 09/13/2022 585  180 - 914 pg/mL Final   Comment: (NOTE) This assay is not validated for testing neonatal or myeloproliferative syndrome specimens for Vitamin B12 levels. Performed at [facility], [address] [place], [place] [address]    Color, Urine 09/14/2022 YELLOW  YELLOW Final   APPearance 09/14/2022 HAZY (A)  CLEAR Final   Specific Gravity, Urine 09/14/2022 1.025  1.005 - 1.030 Final   pH 09/14/2022 5.0  5.0 - 8.0 Final   Glucose, UA 09/14/2022 NEGATIVE  NEGATIVE mg/dL Final   Hgb urine dipstick 09/14/2022 NEGATIVE  NEGATIVE Final   Bilirubin Urine 09/14/2022 NEGATIVE  NEGATIVE Final   Ketones, ur  09/14/2022 NEGATIVE  NEGATIVE mg/dL Final   Protein, ur 16/07/9603 NEGATIVE  NEGATIVE mg/dL Final   Nitrite 54/06/8118 NEGATIVE  NEGATIVE Final   Leukocytes,Ua 09/14/2022 TRACE (A)  NEGATIVE Final   RBC / HPF 09/14/2022 0-5  0 - 5 RBC/hpf Final   WBC, UA 09/14/2022 0-5  0 - 5 WBC/hpf Final   Bacteria, UA 09/14/2022 RARE (A)  NONE SEEN Final   Squamous Epithelial / LPF 09/14/2022 0-5  0 - 5 Final   Mucus 09/14/2022 PRESENT   Final   Performed at [facility], [address] [place], [place] [address]   Glucose-Capillary 09/16/2022 105 (H)  70 - 99 mg/dL Final    Glucose reference range applies only to samples taken after fasting for at least 8 hours.   SARS Coronavirus 2 by RT PCR 09/30/2022 NEGATIVE  NEGATIVE Final   Comment: (NOTE) SARS-CoV-2 target nucleic acids are NOT DETECTED.  The SARS-CoV-2 RNA is generally detectable in upper respiratory specimens during the acute phase of infection. The lowest concentration of SARS-CoV-2 viral copies this assay can detect is 138 copies/mL. A negative result does not preclude SARS-Cov-2 infection and should not be used as the sole basis for treatment or other patient management decisions. A negative result may occur with  improper specimen collection/handling, submission of specimen other than nasopharyngeal swab, presence of viral mutation(s) within the areas targeted by this assay, and inadequate number of viral copies(<138 copies/mL). A negative result must be combined with clinical observations, patient history, and epidemiological information. The expected result is Negative.  Fact Sheet for Patients:  BloggerCourse.com  Fact Sheet for Healthcare Providers:  SeriousBroker.it  This test is no                          t yet approved or cleared by the [place] FDA and  has been authorized for detection and/or diagnosis of SARS-CoV-2 by FDA under an Emergency Use Authorization (EUA). This EUA will remain  in effect (meaning this test can be used) for the duration of the COVID-19 declaration under Section 564(b)(1) of the Act, 21 U.S.C.section 360bbb-3(b)(1), unless the authorization is terminated  or revoked sooner.       Influenza A by PCR 09/30/2022 NEGATIVE  NEGATIVE Final   Influenza B by PCR 09/30/2022 NEGATIVE  NEGATIVE Final   Comment: (NOTE) The Xpert Xpress SARS-CoV-2/FLU/RSV plus assay is intended as an aid in the diagnosis of influenza from Nasopharyngeal swab specimens and should not be used as a sole basis for treatment.  Nasal washings and aspirates are unacceptable for Xpert Xpress SARS-CoV-2/FLU/RSV testing.  Fact Sheet for Patients: BloggerCourse.com  Fact Sheet for Healthcare Providers: SeriousBroker.it  This test is not yet approved or cleared by the [place] FDA and has been authorized for detection and/or diagnosis of SARS-CoV-2 by FDA under an Emergency Use Authorization (EUA). This EUA will remain in effect (meaning this test can be used) for the duration of the COVID-19 declaration under Section 564(b)(1) of the Act, 21 U.S.C. section 360bbb-3(b)(1), unless the authorization is terminated or revoked.     Resp Syncytial Virus by PCR 09/30/2022 NEGATIVE  NEGATIVE Final   Comment: (NOTE) Fact Sheet for Patients: BloggerCourse.com  Fact Sheet for Healthcare Providers: SeriousBroker.it  This test is not yet approved or cleared by the [place] FDA and has been authorized for detection and/or diagnosis of SARS-CoV-2 by FDA under an Emergency Use Authorization (EUA). This EUA will remain  in effect (meaning this test can be used) for the duration of the COVID-19 declaration under Section 564(b)(1) of the Act, 21 U.S.C. section 360bbb-3(b)(1), unless the authorization is terminated or revoked.  Performed at [facility], [address], [place], [place] [address]    Sodium 10/01/2022 136  135 - 145 mmol/L Final   Potassium 10/01/2022 3.8  3.5 - 5.1 mmol/L Final   Chloride 10/01/2022 104  98 - 111 mmol/L Final   CO2 10/01/2022 27  22 - 32 mmol/L Final   Glucose, Bld 10/01/2022 98  70 - 99 mg/dL Final   Glucose reference range applies only to samples taken after fasting for at least 8 hours.   BUN 10/01/2022 12  6 - 20 mg/dL Final   Creatinine, Ser 10/01/2022 0.98  0.44 - 1.00 mg/dL Final   Calcium 29/52/8413 8.9  8.9 - 10.3 mg/dL Final   GFR, Estimated 10/01/2022 >60  >60  mL/min Final   Comment: (NOTE) Calculated using the CKD-EPI Creatinine Equation (2021)    Anion gap 10/01/2022 5  5 - 15 Final   Performed at [facility], [address] [place], [place] [address]   WBC 10/01/2022 5.1  4.0 - 10.5 K/uL Final   RBC 10/01/2022 4.31  3.87 - 5.11 MIL/uL Final   Hemoglobin 10/01/2022 12.7  12.0 - 15.0 g/dL Final   HCT 02/72/5366 38.8  36.0 - 46.0 % Final   MCV 10/01/2022 90.0  80.0 - 100.0 fL Final   MCH 10/01/2022 29.5  26.0 - 34.0 pg Final   MCHC 10/01/2022 32.7  30.0 - 36.0 g/dL Final   RDW 44/12/4740 12.4  11.5 - 15.5 % Final   Platelets 10/01/2022 300  150 - 400 K/uL Final   nRBC 10/01/2022 0.0  0.0 - 0.2 % Final   Performed at [facility], [address], [place], [place] [address]   Lipase 10/01/2022 29  11 - 51 U/L Final   Performed at [facility], [address], [place], [place] [address]  Admission on 07/04/2022, Discharged on 07/05/2022  Component Date Value Ref Range Status   Sodium 07/04/2022 142  135 - 145 mmol/L Final   Potassium 07/04/2022 4.4  3.5 - 5.1 mmol/L Final   Chloride 07/04/2022 109  98 - 111 mmol/L Final   CO2 07/04/2022 26  22 - 32 mmol/L Final   Glucose, Bld 07/04/2022 96  70 - 99 mg/dL Final   Glucose reference range applies only to samples taken after fasting for at least 8 hours.   BUN 07/04/2022 15  6 - 20 mg/dL Final   Creatinine, Ser 07/04/2022 0.83  0.44 - 1.00 mg/dL Final   Calcium 33/29/5188 9.3  8.9 - 10.3 mg/dL Final   Total Protein 41/66/0630 7.2  6.5 - 8.1 g/dL Final   Albumin 16/10/930 3.9  3.5 - 5.0 g/dL Final   AST 35/57/3220 23  15 - 41 U/L Final   ALT 07/04/2022 28  0 - 44 U/L Final   Alkaline Phosphatase 07/04/2022 77  38 - 126 U/L Final   Total Bilirubin 07/04/2022 0.4  0.3 - 1.2 mg/dL Final   GFR, Estimated 07/04/2022 >60  >60 mL/min Final   Comment: (NOTE) Calculated using the CKD-EPI Creatinine Equation (2021)    Anion gap 07/04/2022 7  5 - 15 Final   Performed at [facility], [address] [place], [place] [address]   Alcohol, Ethyl (B) 07/04/2022 <10  <  10 mg/dL Final   Comment: (NOTE) Lowest detectable limit for serum alcohol is 10 mg/dL.  For medical purposes only. Performed at [facility], [address] [place], [place] [id number]    WBC 07/04/2022 7.0  4.0 - 10.5 K/uL Final   RBC 07/04/2022 4.18  3.87 - 5.11 MIL/uL Final   Hemoglobin 07/04/2022 12.8  12.0 - 15.0 g/dL Final   HCT 60/45/409809/21/2023 39.1  36.0 - 46.0 % Final   MCV 07/04/2022 93.5  80.0 - 100.0 fL Final   MCH 07/04/2022 30.6  26.0 - 34.0 pg Final   MCHC 07/04/2022 32.7  30.0 - 36.0 g/dL Final   RDW 11/91/478209/21/2023 12.9  11.5 - 15.5 % Final   Platelets 07/04/2022 243  150 - 400 K/uL Final   nRBC 07/04/2022 0.0  0.0 - 0.2 % Final   Neutrophils Relative % 07/04/2022 43  % Final   Neutro Abs 07/04/2022 3.0  1.7 - 7.7 K/uL Final   Lymphocytes Relative 07/04/2022 50  % Final   Lymphs Abs 07/04/2022 3.5  0.7 - 4.0 K/uL Final   Monocytes Relative 07/04/2022 7  % Final   Monocytes Absolute 07/04/2022 0.5  0.1 - 1.0 K/uL Final   Eosinophils Relative 07/04/2022 0  % Final   Eosinophils Absolute 07/04/2022 0.0  0.0 - 0.5 K/uL Final   Basophils Relative 07/04/2022 0  % Final   Basophils Absolute 07/04/2022 0.0  0.0 - 0.1 K/uL Final   Immature Granulocytes 07/04/2022 0  % Final   Abs Immature Granulocytes 07/04/2022 0.01  0.00 - 0.07 K/uL Final   Performed at [facility], [address] [place], [place] [address]   I-stat hCG, quantitative 07/04/2022 <5.0  <5 mIU/mL Final   Comment 3 07/04/2022          Final   Comment:   GEST. AGE      CONC.  (mIU/mL)   <=1 WEEK        5 - 50     2 WEEKS       50 - 500     3 WEEKS       100 - 10,000     4 WEEKS     1,000 - 30,000        FEMALE AND NON-PREGNANT FEMALE:     LESS THAN 5 mIU/mL   Admission on 06/14/2022, Discharged on 06/14/2022  Component Date Value Ref Range Status   Color, UA  06/14/2022 yellow  yellow Final   Clarity, UA 06/14/2022 cloudy (A)  clear Final   Glucose, UA 06/14/2022 negative  negative mg/dL Final   Bilirubin, UA 30/86/578409/10/2021 negative  negative Final   Ketones, POC UA 06/14/2022 negative  negative mg/dL Final   Spec Grav, UA 69/62/952809/10/2021 1.025  1.010 - 1.025 Final   Blood, UA 06/14/2022 negative  negative Final   pH, UA 06/14/2022 7.5  5.0 - 8.0 Final   Protein Ur, POC 06/14/2022 =30 (A)  negative mg/dL Final   Urobilinogen, UA 06/14/2022 0.2  0.2 or 1.0 E.U./dL Final   Nitrite, UA 41/32/440109/10/2021 Negative  Negative Final   Leukocytes, UA 06/14/2022 Negative  Negative Final   Preg Test, Ur 06/14/2022 Negative  Negative Final   Specimen Description 06/14/2022 URINE, CLEAN CATCH   Final   Special Requests 06/14/2022    Final                   Value:NONE Performed at [facility], [address] [place], [place] [address]    Culture 06/14/2022 MULTIPLE SPECIES PRESENT, SUGGEST RECOLLECTION (A)   Final   Report Status 06/14/2022 06/16/2022 FINAL   Final  Admission on 06/07/2022, Discharged on 06/10/2022  Component Date Value Ref Range Status   SARS Coronavirus 2 by RT PCR 06/07/2022 NEGATIVE  NEGATIVE Final   Comment: (NOTE) SARS-CoV-2 target nucleic acids are NOT DETECTED.  The SARS-CoV-2 RNA is generally detectable in upper respiratory specimens during the acute phase of infection. The lowest concentration of SARS-CoV-2 viral copies this assay can detect is 138 copies/mL. A negative result does not preclude SARS-Cov-2 infection and should not be used as the sole basis for treatment or other patient management decisions. A negative result may occur with  improper specimen collection/handling, submission of specimen other than nasopharyngeal swab, presence of viral mutation(s) within the areas targeted by this assay, and inadequate number of viral copies(<138 copies/mL). A negative result must be combined with clinical observations, patient  history, and epidemiological information. The expected result is Negative.  Fact Sheet for Patients:  BloggerCourse.com  Fact Sheet for Healthcare Providers:  SeriousBroker.it  This test is no                          t yet approved or cleared by the [place] FDA and  has been authorized for detection and/or diagnosis of SARS-CoV-2 by FDA under an Emergency Use Authorization (EUA). This EUA will remain  in effect (meaning this test can be used) for the duration of the COVID-19 declaration under Section 564(b)(1) of the Act, 21 U.S.C.section 360bbb-3(b)(1), unless the authorization is terminated  or revoked sooner.       Influenza A by PCR 06/07/2022 NEGATIVE  NEGATIVE Final   Influenza B by PCR 06/07/2022 NEGATIVE  NEGATIVE Final   Comment: (NOTE) The Xpert Xpress SARS-CoV-2/FLU/RSV plus assay is intended as an aid in the diagnosis of influenza from Nasopharyngeal swab specimens and should not be used as a sole basis for treatment. Nasal washings and aspirates are unacceptable for Xpert Xpress SARS-CoV-2/FLU/RSV testing.  Fact Sheet for Patients: BloggerCourse.com  Fact Sheet for Healthcare Providers: SeriousBroker.it  This test is not yet approved or cleared by the [place] FDA and has been authorized for detection and/or diagnosis of SARS-CoV-2 by FDA under an Emergency Use Authorization (EUA). This EUA will remain in effect (meaning this test can be used) for the duration of the COVID-19 declaration under Section 564(b)(1) of the Act, 21 U.S.C. section 360bbb-3(b)(1), unless the authorization is terminated or revoked.  Performed at [facility], [address], [place], [place] [address]    WBC 06/07/2022 5.7  4.0 - 10.5 K/uL Final   RBC 06/07/2022 4.39  3.87 - 5.11 MIL/uL Final   Hemoglobin 06/07/2022 13.2  12.0 - 15.0 g/dL Final   HCT 82/95/6213 40.4   36.0 - 46.0 % Final   MCV 06/07/2022 92.0  80.0 - 100.0 fL Final   MCH 06/07/2022 30.1  26.0 - 34.0 pg Final   MCHC 06/07/2022 32.7  30.0 - 36.0 g/dL Final   RDW 08/65/7846 12.4  11.5 - 15.5 % Final   Platelets 06/07/2022 308  150 - 400 K/uL Final   nRBC 06/07/2022 0.0  0.0 - 0.2 % Final   Neutrophils Relative % 06/07/2022 42  % Final   Neutro Abs 06/07/2022 2.4  1.7 - 7.7 K/uL Final   Lymphocytes Relative 06/07/2022 54  % Final  Lymphs Abs 06/07/2022 3.1  0.7 - 4.0 K/uL Final   Monocytes Relative 06/07/2022 4  % Final   Monocytes Absolute 06/07/2022 0.2  0.1 - 1.0 K/uL Final   Eosinophils Relative 06/07/2022 0  % Final   Eosinophils Absolute 06/07/2022 0.0  0.0 - 0.5 K/uL Final   Basophils Relative 06/07/2022 0  % Final   Basophils Absolute 06/07/2022 0.0  0.0 - 0.1 K/uL Final   Immature Granulocytes 06/07/2022 0  % Final   Abs Immature Granulocytes 06/07/2022 0.01  0.00 - 0.07 K/uL Final   Performed at [facility], [address], [place], [place] [address]   Sodium 06/07/2022 139  135 - 145 mmol/L Final   Potassium 06/07/2022 4.0  3.5 - 5.1 mmol/L Final   Chloride 06/07/2022 104  98 - 111 mmol/L Final   CO2 06/07/2022 28  22 - 32 mmol/L Final   Glucose, Bld 06/07/2022 104 (H)  70 - 99 mg/dL Final   Glucose reference range applies only to samples taken after fasting for at least 8 hours.   BUN 06/07/2022 8  6 - 20 mg/dL Final   Creatinine, Ser 06/07/2022 0.84  0.44 - 1.00 mg/dL Final   Calcium 60/45/4098 9.1  8.9 - 10.3 mg/dL Final   Total Protein 11/91/4782 6.9  6.5 - 8.1 g/dL Final   Albumin 95/62/1308 3.7  3.5 - 5.0 g/dL Final   AST 65/78/4696 19  15 - 41 U/L Final   ALT 06/07/2022 22  0 - 44 U/L Final   Alkaline Phosphatase 06/07/2022 54  38 - 126 U/L Final   Total Bilirubin 06/07/2022 0.4  0.3 - 1.2 mg/dL Final   GFR, Estimated 06/07/2022 >60  >60 mL/min Final   Comment: (NOTE) Calculated using the CKD-EPI Creatinine Equation (2021)    Anion gap 06/07/2022 7  5 -  15 Final   Performed at [facility], [address], [place], [place] [address]   Hgb A1c MFr Bld 06/07/2022 5.0  4.8 - 5.6 % Final   Comment: (NOTE) Pre diabetes:          5.7%-6.4%  Diabetes:              >6.4%  Glycemic control for   <7.0% adults with diabetes    Mean Plasma Glucose 06/07/2022 96.8  mg/dL Final   Performed at [facility], [address] [place], [place] [address]   TSH 06/07/2022 1.620  0.350 - 4.500 uIU/mL Final   Comment: Performed by a 3rd Generation assay with a functional sensitivity of <=0.01 uIU/mL. Performed at [facility], [address] [place], [place] [address]    RPR Ser Ql 06/07/2022 NON REACTIVE  NON REACTIVE Final   Performed at [facility], [address] [place], [place] [address]   Color, Urine 06/07/2022 YELLOW  YELLOW Final   APPearance 06/07/2022 HAZY (A)  CLEAR Final   Specific Gravity, Urine 06/07/2022 1.018  1.005 - 1.030 Final   pH 06/07/2022 7.0  5.0 - 8.0 Final   Glucose, UA 06/07/2022 NEGATIVE  NEGATIVE mg/dL Final   Hgb urine dipstick 06/07/2022 NEGATIVE  NEGATIVE Final   Bilirubin Urine 06/07/2022 NEGATIVE  NEGATIVE Final   Ketones, ur 06/07/2022 NEGATIVE  NEGATIVE mg/dL Final   Protein, ur 64/40/3474 NEGATIVE  NEGATIVE mg/dL Final   Nitrite 25/95/6387 NEGATIVE  NEGATIVE Final   Leukocytes,Ua 06/07/2022 NEGATIVE  NEGATIVE Final   Performed at [facility], [address] [place], [place] [address]   Cholesterol 06/07/2022 164  0 - 200 mg/dL Final   Triglycerides 17/51/0258 158 (H)  <150 mg/dL Final   HDL 52/77/8242 44  >40 mg/dL Final   Total CHOL/HDL Ratio 06/07/2022 3.7  RATIO Final   VLDL 06/07/2022 32  0 - 40 mg/dL Final   LDL Cholesterol 06/07/2022 88  0 - 99 mg/dL Final   Comment:        Total Cholesterol/HDL:CHD Risk Coronary Heart Disease Risk Table                     Men   Women  1/2 Average Risk   3.4   3.3  Average Risk       5.0   4.4  2 X Average Risk   9.6   7.1  3 X  Average Risk  23.4   11.0        Use the calculated Patient Ratio above and the CHD Risk Table to determine the patient's CHD Risk.        ATP III CLASSIFICATION (LDL):  <100     mg/dL   Optimal  353-614  mg/dL   Near or Above                    Optimal  130-159  mg/dL   Borderline  431-540  mg/dL   High  >086     mg/dL   Very High Performed at [facility], [address] [place], [place] [address]    HIV Screen 4th Generation wRfx 06/07/2022 Non Reactive  Non Reactive Final   Performed at [facility], [address] [place], [place] [address]   SARSCOV2ONAVIRUS 2 AG 06/07/2022 NEGATIVE  NEGATIVE Final   Comment: (NOTE) SARS-CoV-2 antigen NOT DETECTED.   Negative results are presumptive.  Negative results do not preclude SARS-CoV-2 infection and should not be used as the sole basis for treatment or other patient management decisions, including infection  control decisions, particularly in the presence of clinical signs and  symptoms consistent with COVID-19, or in those who have been in contact with the virus.  Negative results must be combined with clinical observations, patient history, and epidemiological information. The expected result is Negative.  Fact Sheet for Patients: https://www.jennings-kim.com/  Fact Sheet for Healthcare Providers: https://alexander-rogers.biz/  This test is not yet approved or cleared by the [place] FDA and  has been authorized for detection and/or diagnosis of SARS-CoV-2 by FDA under an Emergency Use Authorization (EUA).  This EUA will remain in effect (meaning this test can be used) for the duration of  the COV                          ID-19 declaration under Section 564(b)(1) of the Act, 21 U.S.C. section 360bbb-3(b)(1), unless the authorization is terminated or revoked sooner.     POC Amphetamine UR 06/07/2022 None Detected  NONE DETECTED (Cut Off Level 1000 ng/mL) Final   POC Secobarbital  (BAR) 06/07/2022 None Detected  NONE DETECTED (Cut Off Level 300 ng/mL) Final   POC Buprenorphine (BUP) 06/07/2022 None Detected  NONE DETECTED (Cut Off Level 10 ng/mL) Final   POC Oxazepam (BZO) 06/07/2022 None Detected  NONE DETECTED (Cut Off Level 300 ng/mL) Final   POC Cocaine UR 06/07/2022 None Detected  NONE DETECTED (Cut Off Level 300 ng/mL) Final   POC Methamphetamine UR 06/07/2022 None  Detected  NONE DETECTED (Cut Off Level 1000 ng/mL) Final   POC Morphine 06/07/2022 None Detected  NONE DETECTED (Cut Off Level 300 ng/mL) Final   POC Methadone UR 06/07/2022 None Detected  NONE DETECTED (Cut Off Level 300 ng/mL) Final   POC Oxycodone UR 06/07/2022 None Detected  NONE DETECTED (Cut Off Level 100 ng/mL) Final   POC Marijuana UR 06/07/2022 None Detected  NONE DETECTED (Cut Off Level 50 ng/mL) Final   Preg Test, Ur 06/08/2022 NEGATIVE  NEGATIVE Final   Comment:        THE SENSITIVITY OF THIS METHODOLOGY IS >20 mIU/mL. Performed at [facility], [address] [place], [place] [address]    Preg Test, Ur 06/08/2022 NEGATIVE  NEGATIVE Final   Comment:        THE SENSITIVITY OF THIS METHODOLOGY IS >24 mIU/mL     Blood Alcohol level:  Lab Results  Component Value Date   ETH <10 07/04/2022   ETH <10 02/07/2022    Metabolic Disorder Labs: Lab Results  Component Value Date   HGBA1C 5.0 06/07/2022   MPG 96.8 06/07/2022   MPG 96.8 02/07/2022   No results found for: "PROLACTIN" Lab Results  Component Value Date   CHOL 181 07/25/2022   TRIG 118 07/25/2022   HDL 45 07/25/2022   CHOLHDL 4.0 07/25/2022   VLDL 24 07/25/2022   LDLCALC 112 (H) 07/25/2022   LDLCALC 88 06/07/2022    Therapeutic Lab Levels: No results found for: "LITHIUM" No results found for: "VALPROATE" No results found for: "CBMZ"  Physical Findings   GAD-7    Flowsheet Row [facility]17/2021 [facility]  Total GAD-7 Score 17      PHQ2-9    Flowsheet Row ED  from 06/07/2022 in [facility] Office Visit from 12/17/2021 [facility]  PHQ-2 Total Score 1 2  PHQ-9 Total Score 2 8      Flowsheet Row ED from 07/25/2022 in [facility] ED from 07/04/2022 in [place]  [facility] ED from 06/14/2022 in [facility]   C-SSRS RISK CATEGORY No Risk No Risk No Risk        Musculoskeletal  Strength & Muscle Tone: within normal limits Gait & Station: normal Patient leans: N/A  Psychiatric Specialty Exam  Presentation  General Appearance:  Appropriate for Environment; Casual  Eye Contact: Good  Speech: Clear and Coherent; Normal Rate  Speech Volume: Normal  Handedness: Right   Mood and Affect  Mood: Euthymic  Affect: Appropriate; Congruent   Thought Process  Thought Processes: Coherent; Goal Directed; Linear  Descriptions of Associations:Intact  Orientation:Full (Time, Place and Person)  Thought Content:Logical; WDL  Diagnosis of Schizophrenia or Schizoaffective disorder in past: Yes    Hallucinations:Hallucinations: None  Ideas of Reference:None  Suicidal Thoughts:Suicidal Thoughts: No  Homicidal Thoughts:Homicidal Thoughts: No   Sensorium  Memory: Immediate Good; Recent Fair  Judgment: Fair  Insight: Present   Executive Functions  Concentration: Good  Attention Span: Good  Recall: Good  Fund of Knowledge: Fair  Language: Fair   Psychomotor Activity  Psychomotor Activity:No data recorded  Assets  Assets: Communication Skills; Financial Resources/Insurance; Physical Health; Resilience   Sleep  Sleep: Sleep: Good   No data recorded  Physical Exam  Physical Exam Vitals and nursing note reviewed.  Constitutional:      Appearance: Normal appearance. She is well-developed.  HENT:     Head: Normocephalic and atraumatic.     Nose: Nose normal.   Cardiovascular:     Rate and Rhythm: Normal rate.  Pulmonary:     Effort: Pulmonary effort is normal.  Musculoskeletal:        General: Normal range of motion.     Cervical back: Normal range of motion.  Neurological:     Mental Status: She is alert and oriented to person, place, and time.  Psychiatric:        Attention and Perception: Attention and perception normal.        Mood and Affect: Mood and affect normal.        Speech: Speech normal.        Behavior: Behavior normal. Behavior is cooperative.        Thought Content: Thought content normal.        Cognition and Memory: Cognition and memory normal.    Review of Systems  Constitutional: Negative.   HENT: Negative.    Eyes: Negative.   Respiratory: Negative.    Cardiovascular: Negative.   Gastrointestinal: Negative.   Genitourinary: Negative.   Musculoskeletal: Negative.   Skin: Negative.   Neurological: Negative.   Psychiatric/Behavioral: Negative.     Blood pressure (!) 86/61, pulse 95, temperature 98.3 F (36.8 C), temperature source Oral, resp. rate 20, SpO2 100 %. There is no height or weight on file to calculate BMI.  Treatment Plan Summary: Daily contact with patient to assess and evaluate symptoms and progress in treatment Patient remains cleared by psychiatry.  Treatment plan reviewed with attending provider, Dr. Gretta Cool. Transition of care team continues to assist with disposition.  Lenard Lance, FNP 10/02/2022 9:29 AM

## 2022-10-02 NOTE — Progress Notes (Signed)
Pt is watching TV quietly. No distress noted or concerns voiced. Staff will monitor for pt's safety. 

## 2022-10-02 NOTE — Progress Notes (Signed)
Pt is asleep. Respirations are even and unlabored. No signs of acute distress noted. Staff will monitor for pt's safety. 

## 2022-10-03 NOTE — ED Notes (Signed)
Pt is currently sleeping, no distress noted, environmental check complete, will continue to monitor patient for safety. ? ?

## 2022-10-03 NOTE — ED Notes (Signed)
Pt sleeping at present, no distress noted.  Monitoring for safety. 

## 2022-10-03 NOTE — ED Notes (Signed)
Patient is quietly sitting on the unit, no distress noted, will continue to monitor patient for safety

## 2022-10-03 NOTE — ED Provider Notes (Signed)
Behavioral Health Progress Note  Date and Time: 10/03/2022 2:53 PM Name: Nicole [name] MRN:  [id number]  Subjective:    Nicole [name] is a 31 y.o. female, with PMH of SCZ unspecified, MDD with psychosis, who presented voluntary to [facility] (07/25/2022) via [facility] after a verbal altercation with [name] (not patient's legal guardian) for transient SI.  This is patient's fourth visit to [facility] for similar concerns this year. Patient has been dismissed from previous group home due to threatening SI and HI, then leaving the group home.   Legal Guardian: [name]  On reassessment, pt reports euthymic mood. She denies suicidal, homicidal, violent ideations. She denies auditory visual hallucinations or paranoia. Objectively, there is no evidence of internal preoccupation, agitation, aggression or distractibility. Pt is calm, cooperative, pleasant. Pt denies any concerns at this time. She verbalizes hope for discharge and placement soon. Social work continues to assist.   Diagnosis:  Final diagnoses:  Severe episode of recurrent major depressive disorder, with psychotic features (HCC)    Total Time spent with patient: 15 minutes  Past Psychiatric History: see HPI Past Medical History:  Past Medical History:  Diagnosis Date   Anxiety    Depression    Hypothyroidism 08/07/2022   Tobacco use disorder 08/07/2022    Past Surgical History:  Procedure Laterality Date   WISDOM TOOTH EXTRACTION Bilateral 2020   Family History:  Family History  Problem Relation Age of Onset   Hypertension Father    Diabetes Father    Family Psychiatric  History: None reported Social History:  Social History   Substance and Sexual Activity  Alcohol Use Never     Social History   Substance and Sexual Activity  Drug Use Never    Social History   Socioeconomic History   Marital status: Single    Spouse name: Not  on file   Number of children: Not on file   Years of education: Not on file   Highest education level: Not on file  Occupational History   Not on file  Tobacco Use   Smoking status: Every Day    Types: Cigarettes   Smokeless tobacco: Never  Vaping Use   Vaping Use: Some days  Substance and Sexual Activity   Alcohol use: Never   Drug use: Never   Sexual activity: Yes    Partners: Female    Birth control/protection: Implant  Other Topics Concern   Not on file  Social History Narrative   Not on file   Social Determinants of Health   Financial Resource Strain: Not on file  Food Insecurity: Not on file  Transportation Needs: Not on file  Physical Activity: Not on file  Stress: Not on file  Social Connections: Not on file   SDOH:  SDOH Screenings   Depression (PHQ2-9): Low Risk  (06/10/2022)  Tobacco Use: High Risk (09/30/2022)   Additional Social History:    Pain Medications: SEE MAR Prescriptions: SEE MAR Over the Counter: SEE MAR History of alcohol / drug use?: Yes Longest period of sobriety (when/how long): N/A Negative Consequences of Use:  (NONE) Withdrawal Symptoms: None Name of Substance 1: ETOH IN PAST--PT CURRENTLY DENIES 1 - Frequency: SOCIAL USE ETOH IN PAST 1 - Method of Aquiring: LEGAL 1- Route of Use: ORAL DRINK Name of Substance 2: NICOTINE-VAPE AND CIGARETTES 2 - Amount (size/oz): LESS THAN ONE PACK 2 - Frequency: DAILY 2 - Method of Aquiring: LEGAL  2 - Route of Substance Use: ORAL SMOKE                Sleep: Good  Appetite:  Good  Current Medications:  Current Facility-Administered Medications  Medication Dose Route Frequency Provider Last Rate Last Admin   acetaminophen (TYLENOL) tablet 650 mg  650 mg Oral Q6H PRN Onuoha, Chinwendu V, NP   650 mg at 09/21/22 2200   ARIPiprazole ER (ABILIFY MAINTENA) injection 400 mg  400 mg Intramuscular Q28 days Princess Bruins, DO   400 mg at 09/27/22 1445   docusate sodium (COLACE) capsule 100 mg   100 mg Oral Daily [name], MD   100 mg at 10/03/22 0925   hydrOXYzine (ATARAX) tablet 25 mg  25 mg Oral TID PRN Onuoha, Chinwendu V, NP   25 mg at 10/02/22 2111   ibuprofen (ADVIL) tablet 400 mg  400 mg Oral Q6H PRN Onuoha, Chinwendu V, NP   400 mg at 09/29/22 1942   levothyroxine (SYNTHROID) tablet 100 mcg  100 mcg Oral Q0600 Princess Bruins, DO   100 mcg at 10/03/22 0612   loratadine (CLARITIN) tablet 10 mg  10 mg Oral Daily Rankin, Shuvon B, NP   10 mg at 10/03/22 1610   metFORMIN (GLUCOPHAGE) tablet 500 mg  500 mg Oral Q breakfast Park Pope, MD   500 mg at 10/03/22 9604   nicotine (NICODERM CQ - dosed in mg/24 hours) patch 14 mg  14 mg Transdermal Q0600 Onuoha, Chinwendu V, NP   14 mg at 09/30/22 1038   ondansetron (ZOFRAN-ODT) disintegrating tablet 4 mg  4 mg Oral Q8H PRN Sindy Guadeloupe, NP   4 mg at 09/30/22 2336   Oxcarbazepine (TRILEPTAL) tablet 300 mg  300 mg Oral BID Onuoha, Chinwendu V, NP   300 mg at 10/03/22 0924   pantoprazole (PROTONIX) EC tablet 40 mg  40 mg Oral Daily Park Pope, MD   40 mg at 10/03/22 0925   polyethylene glycol (MIRALAX / GLYCOLAX) packet 17 g  17 g Oral Daily PRN Ajibola, Ene A, NP   17 g at 09/28/22 0225   QUEtiapine (SEROQUEL) tablet 400 mg  400 mg Oral BID Onuoha, Chinwendu V, NP   400 mg at 10/03/22 0925   sertraline (ZOLOFT) tablet 150 mg  150 mg Oral Daily Onuoha, Chinwendu V, NP   150 mg at 10/03/22 0926   simethicone (MYLICON) chewable tablet 80 mg  80 mg Oral Q6H PRN Sindy Guadeloupe, NP   80 mg at 09/29/22 2144   traZODone (DESYREL) tablet 100 mg  100 mg Oral QHS Vernard Gambles H, NP   100 mg at 10/02/22 2110   valACYclovir (VALTREX) tablet 500 mg  500 mg Oral Daily Princess Bruins, DO   500 mg at 10/03/22 5409   ziprasidone (GEODON) injection 20 mg  20 mg Intramuscular Q12H PRN Onuoha, Chinwendu V, NP   20 mg at 09/19/22 2133   Current Outpatient Medications  Medication Sig Dispense Refill   ABILIFY MAINTENA 400 MG PRSY prefilled syringe 400 mg  every 28 (twenty-eight) days.     cetirizine (ZYRTEC) 10 MG tablet Take 10 mg by mouth daily.     cyclobenzaprine (FLEXERIL) 10 MG tablet Take 1 tablet (10 mg total) by mouth 2 (two) times daily as needed for muscle spasms. 20 tablet 0   fluticasone (FLONASE) 50 MCG/ACT nasal spray Place 1 spray into both nostrils daily.     meloxicam (MOBIC) 15 MG tablet Take 15 mg by mouth daily.  Oxcarbazepine (TRILEPTAL) 300 MG tablet Take 1 tablet (300 mg total) by mouth 2 (two) times daily. 60 tablet 0   QUEtiapine (SEROQUEL) 400 MG tablet Take 1 tablet (400 mg total) by mouth 2 (two) times daily. 60 tablet 0   sertraline (ZOLOFT) 50 MG tablet Take 3 tablets (150 mg total) by mouth in the morning. 90 tablet 0   traZODone (DESYREL) 100 MG tablet Take 1 tablet (100 mg total) by mouth at bedtime. 30 tablet 0   valACYclovir (VALTREX) 500 MG tablet Take 500 mg by mouth daily.      Labs  Lab Results:  Admission on 07/25/2022  Component Date Value Ref Range Status   SARS Coronavirus 2 by RT PCR 07/25/2022 NEGATIVE  NEGATIVE Final   Comment: (NOTE) SARS-CoV-2 target nucleic acids are NOT DETECTED.  The SARS-CoV-2 RNA is generally detectable in upper respiratory specimens during the acute phase of infection. The lowest concentration of SARS-CoV-2 viral copies this assay can detect is 138 copies/mL. A negative result does not preclude SARS-Cov-2 infection and should not be used as the sole basis for treatment or other patient management decisions. A negative result may occur with  improper specimen collection/handling, submission of specimen other than nasopharyngeal swab, presence of viral mutation(s) within the areas targeted by this assay, and inadequate number of viral copies(<138 copies/mL). A negative result must be combined with clinical observations, patient history, and epidemiological information. The expected result is Negative.  Fact Sheet for Patients:   BloggerCourse.com  Fact Sheet for Healthcare Providers:  SeriousBroker.it  This test is no                          t yet approved or cleared by the [place] FDA and  has been authorized for detection and/or diagnosis of SARS-CoV-2 by FDA under an Emergency Use Authorization (EUA). This EUA will remain  in effect (meaning this test can be used) for the duration of the COVID-19 declaration under Section 564(b)(1) of the Act, 21 U.S.C.section 360bbb-3(b)(1), unless the authorization is terminated  or revoked sooner.       Influenza A by PCR 07/25/2022 NEGATIVE  NEGATIVE Final   Influenza B by PCR 07/25/2022 NEGATIVE  NEGATIVE Final   Comment: (NOTE) The Xpert Xpress SARS-CoV-2/FLU/RSV plus assay is intended as an aid in the diagnosis of influenza from Nasopharyngeal swab specimens and should not be used as a sole basis for treatment. Nasal washings and aspirates are unacceptable for Xpert Xpress SARS-CoV-2/FLU/RSV testing.  Fact Sheet for Patients: BloggerCourse.com  Fact Sheet for Healthcare Providers: SeriousBroker.it  This test is not yet approved or cleared by the [place] FDA and has been authorized for detection and/or diagnosis of SARS-CoV-2 by FDA under an Emergency Use Authorization (EUA). This EUA will remain in effect (meaning this test can be used) for the duration of the COVID-19 declaration under Section 564(b)(1) of the Act, 21 U.S.C. section 360bbb-3(b)(1), unless the authorization is terminated or revoked.  Performed at [facility], [address] [place], [place] [address]    WBC 07/25/2022 8.3  4.0 - 10.5 K/uL Final   RBC 07/25/2022 4.44  3.87 - 5.11 MIL/uL Final   Hemoglobin 07/25/2022 13.7  12.0 - 15.0 g/dL Final   HCT 64/33/2951 40.2  36.0 - 46.0 % Final   MCV 07/25/2022 90.5  80.0 - 100.0 fL Final   MCH 07/25/2022 30.9  26.0 - 34.0  pg Final   MCHC  07/25/2022 34.1  30.0 - 36.0 g/dL Final   RDW 11/91/4782 12.2  11.5 - 15.5 % Final   Platelets 07/25/2022 248  150 - 400 K/uL Final   nRBC 07/25/2022 0.0  0.0 - 0.2 % Final   Neutrophils Relative % 07/25/2022 43  % Final   Neutro Abs 07/25/2022 3.6  1.7 - 7.7 K/uL Final   Lymphocytes Relative 07/25/2022 52  % Final   Lymphs Abs 07/25/2022 4.2 (H)  0.7 - 4.0 K/uL Final   Monocytes Relative 07/25/2022 5  % Final   Monocytes Absolute 07/25/2022 0.4  0.1 - 1.0 K/uL Final   Eosinophils Relative 07/25/2022 0  % Final   Eosinophils Absolute 07/25/2022 0.0  0.0 - 0.5 K/uL Final   Basophils Relative 07/25/2022 0  % Final   Basophils Absolute 07/25/2022 0.0  0.0 - 0.1 K/uL Final   Immature Granulocytes 07/25/2022 0  % Final   Abs Immature Granulocytes 07/25/2022 0.02  0.00 - 0.07 K/uL Final   Performed at [facility], [address] [place], [place] [address]   Sodium 07/25/2022 138  135 - 145 mmol/L Final   Potassium 07/25/2022 4.0  3.5 - 5.1 mmol/L Final   Chloride 07/25/2022 104  98 - 111 mmol/L Final   CO2 07/25/2022 29  22 - 32 mmol/L Final   Glucose, Bld 07/25/2022 83  70 - 99 mg/dL Final   Glucose reference range applies only to samples taken after fasting for at least 8 hours.   BUN 07/25/2022 11  6 - 20 mg/dL Final   Creatinine, Ser 07/25/2022 0.97  0.44 - 1.00 mg/dL Final   Calcium 30/86/5784 9.2  8.9 - 10.3 mg/dL Final   Total Protein 69/62/9528 7.0  6.5 - 8.1 g/dL Final   Albumin 41/32/4401 3.8  3.5 - 5.0 g/dL Final   AST 02/72/5366 18  15 - 41 U/L Final   ALT 07/25/2022 22  0 - 44 U/L Final   Alkaline Phosphatase 07/25/2022 64  38 - 126 U/L Final   Total Bilirubin 07/25/2022 0.2 (L)  0.3 - 1.2 mg/dL Final   GFR, Estimated 07/25/2022 >60  >60 mL/min Final   Comment: (NOTE) Calculated using the CKD-EPI Creatinine Equation (2021)    Anion gap 07/25/2022 5  5 - 15 Final   Performed at [facility], [address] [place], [place] [address]   POC  Amphetamine UR 07/25/2022 None Detected  NONE DETECTED (Cut Off Level 1000 ng/mL) Preliminary   POC Secobarbital (BAR) 07/25/2022 None Detected  NONE DETECTED (Cut Off Level 300 ng/mL) Preliminary   POC Buprenorphine (BUP) 07/25/2022 None Detected  NONE DETECTED (Cut Off Level 10 ng/mL) Preliminary   POC Oxazepam (BZO) 07/25/2022 None Detected  NONE DETECTED (Cut Off Level 300 ng/mL) Preliminary   POC Cocaine UR 07/25/2022 None Detected  NONE DETECTED (Cut Off Level 300 ng/mL) Preliminary   POC Methamphetamine UR 07/25/2022 None Detected  NONE DETECTED (Cut Off Level 1000 ng/mL) Preliminary   POC Morphine 07/25/2022 None Detected  NONE DETECTED (Cut Off Level 300 ng/mL) Preliminary   POC Methadone UR 07/25/2022 None Detected  NONE DETECTED (Cut Off Level 300 ng/mL) Preliminary   POC Oxycodone UR 07/25/2022 None Detected  NONE DETECTED (Cut Off Level 100 ng/mL) Preliminary   POC Marijuana UR 07/25/2022 None Detected  NONE DETECTED (Cut Off Level 50 ng/mL) Preliminary   SARSCOV2ONAVIRUS 2 AG 07/25/2022 NEGATIVE  NEGATIVE Final   Comment: (NOTE) SARS-CoV-2 antigen NOT DETECTED.  Negative results are presumptive.  Negative results do not preclude SARS-CoV-2 infection and should not be used as the sole basis for treatment or other patient management decisions, including infection  control decisions, particularly in the presence of clinical signs and  symptoms consistent with COVID-19, or in those who have been in contact with the virus.  Negative results must be combined with clinical observations, patient history, and epidemiological information. The expected result is Negative.  Fact Sheet for Patients: https://www.jennings-kim.com/https://www.fda.gov/media/141569/download  Fact Sheet for Healthcare Providers: https://alexander-rogers.biz/https://www.fda.gov/media/141568/download  This test is not yet approved or cleared by the [place] FDA and  has been authorized for detection and/or diagnosis of SARS-CoV-2 by FDA under an Emergency Use  Authorization (EUA).  This EUA will remain in effect (meaning this test can be used) for the duration of  the COV                          ID-19 declaration under Section 564(b)(1) of the Act, 21 U.S.C. section 360bbb-3(b)(1), unless the authorization is terminated or revoked sooner.     Cholesterol 07/25/2022 181  0 - 200 mg/dL Final   Triglycerides 16/10/960410/09/2022 118  <150 mg/dL Final   HDL 54/09/811910/09/2022 45  >40 mg/dL Final   Total CHOL/HDL Ratio 07/25/2022 4.0  RATIO Final   VLDL 07/25/2022 24  0 - 40 mg/dL Final   LDL Cholesterol 07/25/2022 112 (H)  0 - 99 mg/dL Final   Comment:        Total Cholesterol/HDL:CHD Risk Coronary Heart Disease Risk Table                     Men   Women  1/2 Average Risk   3.4   3.3  Average Risk       5.0   4.4  2 X Average Risk   9.6   7.1  3 X Average Risk  23.4   11.0        Use the calculated Patient Ratio above and the CHD Risk Table to determine the patient's CHD Risk.        ATP III CLASSIFICATION (LDL):  <100     mg/dL   Optimal  [id number]  mg/dL   Near or Above                    Optimal  130-159  mg/dL   Borderline  [id number]  mg/dL   High  >865>190     mg/dL   Very High Performed at [facility], [address] [place], [place] [address]    TSH 07/25/2022 6.668 (H)  0.350 - 4.500 uIU/mL Final   Comment: Performed by a 3rd Generation assay with a functional sensitivity of <=0.01 uIU/mL. Performed at [facility], [address] [place] [address] [place], [place] [address]    Glucose-Capillary 07/26/2022 104 (H)  70 - 99 mg/dL Final   Glucose reference range applies only to samples taken after fasting for at least 8 hours.   T3, Free 07/31/2022 2.3  2.0 - 4.4 pg/mL Final   Comment: (NOTE) Performed At: [facility]  [address] [place], [place] [id number] Jolene SchimkeNagendra Sanjai MD UU:[id number]:[phone]    Free T4 07/31/2022 0.60 (L)  0.61 - 1.12 ng/dL Final   Comment: (NOTE) Biotin ingestion may interfere with free T4 tests. If the results  are inconsistent with the TSH level, previous test results, or the clinical presentation, then consider biotin  interference. If needed, order repeat testing after stopping biotin. Performed at [facility], [address], [place], [place] [address]    Glucose-Capillary 08/29/2022 100 (H)  70 - 99 mg/dL Final   Glucose reference range applies only to samples taken after fasting for at least 8 hours.   TSH 09/05/2022 0.793  0.350 - 4.500 uIU/mL Final   Comment: Performed by a 3rd Generation assay with a functional sensitivity of <=0.01 uIU/mL. Performed at [facility], [address], [place], [place] [address]    Free T4 09/05/2022 0.73  0.61 - 1.12 ng/dL Final   Comment: (NOTE) Biotin ingestion may interfere with free T4 tests. If the results are inconsistent with the TSH level, previous test results, or the clinical presentation, then consider biotin interference. If needed, order repeat testing after stopping biotin. Performed at [facility], [address], [place], [place] [address]    Preg Test, Ur 09/06/2022 Negative  Negative Final   Preg Test, Ur 09/06/2022 NEGATIVE  NEGATIVE Final   Comment:        THE SENSITIVITY OF THIS METHODOLOGY IS >24 mIU/mL    Preg Test, Ur 09/05/2022 NEGATIVE  NEGATIVE Final   Comment:        THE SENSITIVITY OF THIS METHODOLOGY IS >24 mIU/mL    Sodium 09/13/2022 136  135 - 145 mmol/L Final   Potassium 09/13/2022 4.5  3.5 - 5.1 mmol/L Final   Chloride 09/13/2022 105  98 - 111 mmol/L Final   CO2 09/13/2022 21 (L)  22 - 32 mmol/L Final   Glucose, Bld 09/13/2022 111 (H)  70 - 99 mg/dL Final   Glucose reference range applies only to samples taken after fasting for at least 8 hours.   BUN 09/13/2022 14  6 - 20 mg/dL Final   Creatinine, Ser 09/13/2022 0.92  0.44 - 1.00 mg/dL Final   Calcium 97/53/0051 9.1  8.9 - 10.3 mg/dL Final   GFR, Estimated 09/13/2022 >60  >60 mL/min Final   Comment: (NOTE) Calculated using the CKD-EPI  Creatinine Equation (2021)    Anion gap 09/13/2022 10  5 - 15 Final   Performed at [facility], [address] [place], [place] [address]   Vitamin B-12 09/13/2022 585  180 - 914 pg/mL Final   Comment: (NOTE) This assay is not validated for testing neonatal or myeloproliferative syndrome specimens for Vitamin B12 levels. Performed at [facility], [address] [place], [place] [address]    Color, Urine 09/14/2022 YELLOW  YELLOW Final   APPearance 09/14/2022 HAZY (A)  CLEAR Final   Specific Gravity, Urine 09/14/2022 1.025  1.005 - 1.030 Final   pH 09/14/2022 5.0  5.0 - 8.0 Final   Glucose, UA 09/14/2022 NEGATIVE  NEGATIVE mg/dL Final   Hgb urine dipstick 09/14/2022 NEGATIVE  NEGATIVE Final   Bilirubin Urine 09/14/2022 NEGATIVE  NEGATIVE Final   Ketones, ur 09/14/2022 NEGATIVE  NEGATIVE mg/dL Final   Protein, ur 70/14/1030 NEGATIVE  NEGATIVE mg/dL Final   Nitrite 13/14/3888 NEGATIVE  NEGATIVE Final   Leukocytes,Ua 09/14/2022 TRACE (A)  NEGATIVE Final   RBC / HPF 09/14/2022 0-5  0 - 5 RBC/hpf Final   WBC, UA 09/14/2022 0-5  0 - 5 WBC/hpf Final   Bacteria, UA 09/14/2022 RARE (A)  NONE SEEN Final   Squamous Epithelial / LPF 09/14/2022 0-5  0 - 5 Final   Mucus 09/14/2022 PRESENT   Final   Performed at [facility], [address] [place], [place] [address]   Glucose-Capillary 09/16/2022 105 (H)  70 - 99 mg/dL Final   Glucose reference range applies only to samples taken after fasting for at least 8 hours.   SARS Coronavirus 2 by RT PCR 09/30/2022 NEGATIVE  NEGATIVE Final   Comment: (NOTE) SARS-CoV-2 target nucleic acids are NOT DETECTED.  The SARS-CoV-2 RNA is generally detectable in upper respiratory specimens during the acute phase of infection. The lowest concentration of SARS-CoV-2 viral copies this assay can detect is 138 copies/mL. A negative result does not preclude SARS-Cov-2 infection and should not be used as the sole basis for treatment or other  patient management decisions. A negative result may occur with  improper specimen collection/handling, submission of specimen other than nasopharyngeal swab, presence of viral mutation(s) within the areas targeted by this assay, and inadequate number of viral copies(<138 copies/mL). A negative result must be combined with clinical observations, patient history, and epidemiological information. The expected result is Negative.  Fact Sheet for Patients:  BloggerCourse.com  Fact Sheet for Healthcare Providers:  SeriousBroker.it  This test is no                          t yet approved or cleared by the [place] FDA and  has been authorized for detection and/or diagnosis of SARS-CoV-2 by FDA under an Emergency Use Authorization (EUA). This EUA will remain  in effect (meaning this test can be used) for the duration of the COVID-19 declaration under Section 564(b)(1) of the Act, 21 U.S.C.section 360bbb-3(b)(1), unless the authorization is terminated  or revoked sooner.       Influenza A by PCR 09/30/2022 NEGATIVE  NEGATIVE Final   Influenza B by PCR 09/30/2022 NEGATIVE  NEGATIVE Final   Comment: (NOTE) The Xpert Xpress SARS-CoV-2/FLU/RSV plus assay is intended as an aid in the diagnosis of influenza from Nasopharyngeal swab specimens and should not be used as a sole basis for treatment. Nasal washings and aspirates are unacceptable for Xpert Xpress SARS-CoV-2/FLU/RSV testing.  Fact Sheet for Patients: BloggerCourse.com  Fact Sheet for Healthcare Providers: SeriousBroker.it  This test is not yet approved or cleared by the [place] FDA and has been authorized for detection and/or diagnosis of SARS-CoV-2 by FDA under an Emergency Use Authorization (EUA). This EUA will remain in effect (meaning this test can be used) for the duration of the COVID-19 declaration under Section  564(b)(1) of the Act, 21 U.S.C. section 360bbb-3(b)(1), unless the authorization is terminated or revoked.     Resp Syncytial Virus by PCR 09/30/2022 NEGATIVE  NEGATIVE Final   Comment: (NOTE) Fact Sheet for Patients: BloggerCourse.com  Fact Sheet for Healthcare Providers: SeriousBroker.it  This test is not yet approved or cleared by the [place] FDA and has been authorized for detection and/or diagnosis of SARS-CoV-2 by FDA under an Emergency Use Authorization (EUA). This EUA will remain in effect (meaning this test can be used) for the duration of the COVID-19 declaration under Section 564(b)(1) of the Act, 21 U.S.C. section 360bbb-3(b)(1), unless the authorization is terminated or revoked.  Performed at [facility], [address], [place], [place] [address]    Sodium 10/01/2022 136  135 - 145 mmol/L Final   Potassium 10/01/2022 3.8  3.5 - 5.1 mmol/L Final   Chloride 10/01/2022 104  98 - 111 mmol/L Final   CO2 10/01/2022 27  22 - 32 mmol/L Final   Glucose, Bld 10/01/2022  98  70 - 99 mg/dL Final   Glucose reference range applies only to samples taken after fasting for at least 8 hours.   BUN 10/01/2022 12  6 - 20 mg/dL Final   Creatinine, Ser 10/01/2022 0.98  0.44 - 1.00 mg/dL Final   Calcium 16/07/9603 8.9  8.9 - 10.3 mg/dL Final   GFR, Estimated 10/01/2022 >60  >60 mL/min Final   Comment: (NOTE) Calculated using the CKD-EPI Creatinine Equation (2021)    Anion gap 10/01/2022 5  5 - 15 Final   Performed at [facility], [address] [place], [place] [address]   WBC 10/01/2022 5.1  4.0 - 10.5 K/uL Final   RBC 10/01/2022 4.31  3.87 - 5.11 MIL/uL Final   Hemoglobin 10/01/2022 12.7  12.0 - 15.0 g/dL Final   HCT 11/91/4782 38.8  36.0 - 46.0 % Final   MCV 10/01/2022 90.0  80.0 - 100.0 fL Final   MCH 10/01/2022 29.5  26.0 - 34.0 pg Final   MCHC 10/01/2022 32.7  30.0 - 36.0 g/dL Final   RDW 95/62/1308 12.4   11.5 - 15.5 % Final   Platelets 10/01/2022 300  150 - 400 K/uL Final   nRBC 10/01/2022 0.0  0.0 - 0.2 % Final   Performed at [facility], [address], [place], [place] [address]   Lipase 10/01/2022 29  11 - 51 U/L Final   Performed at [facility], [address] [place], [place] [address]  Admission on 07/04/2022, Discharged on 07/05/2022  Component Date Value Ref Range Status   Sodium 07/04/2022 142  135 - 145 mmol/L Final   Potassium 07/04/2022 4.4  3.5 - 5.1 mmol/L Final   Chloride 07/04/2022 109  98 - 111 mmol/L Final   CO2 07/04/2022 26  22 - 32 mmol/L Final   Glucose, Bld 07/04/2022 96  70 - 99 mg/dL Final   Glucose reference range applies only to samples taken after fasting for at least 8 hours.   BUN 07/04/2022 15  6 - 20 mg/dL Final   Creatinine, Ser 07/04/2022 0.83  0.44 - 1.00 mg/dL Final   Calcium 52/84/1324 9.3  8.9 - 10.3 mg/dL Final   Total Protein 40/07/2724 7.2  6.5 - 8.1 g/dL Final   Albumin 36/64/4034 3.9  3.5 - 5.0 g/dL Final   AST 74/25/9563 23  15 - 41 U/L Final   ALT 07/04/2022 28  0 - 44 U/L Final   Alkaline Phosphatase 07/04/2022 77  38 - 126 U/L Final   Total Bilirubin 07/04/2022 0.4  0.3 - 1.2 mg/dL Final   GFR, Estimated 07/04/2022 >60  >60 mL/min Final   Comment: (NOTE) Calculated using the CKD-EPI Creatinine Equation (2021)    Anion gap 07/04/2022 7  5 - 15 Final   Performed at [facility], [address] [place], [place] [address]   Alcohol, Ethyl (B) 07/04/2022 <10  <10 mg/dL Final   Comment: (NOTE) Lowest detectable limit for serum alcohol is 10 mg/dL.  For medical purposes only. Performed at [facility], [address] [place], [place] [address]    WBC 07/04/2022 7.0  4.0 - 10.5 K/uL Final   RBC 07/04/2022 4.18  3.87 - 5.11 MIL/uL Final   Hemoglobin 07/04/2022 12.8  12.0 - 15.0 g/dL Final   HCT 18/84/1660 39.1  36.0 - 46.0 % Final   MCV 07/04/2022 93.5  80.0 - 100.0 fL Final   MCH  07/04/2022 30.6  26.0 - 34.0 pg Final   MCHC 07/04/2022 32.7  30.0 - 36.0 g/dL Final   RDW 17/61/6073 12.9  11.5 - 15.5 % Final   Platelets 07/04/2022 243  150 - 400 K/uL Final   nRBC 07/04/2022 0.0  0.0 - 0.2 % Final   Neutrophils Relative % 07/04/2022 43  % Final   Neutro Abs 07/04/2022 3.0  1.7 - 7.7 K/uL Final   Lymphocytes Relative 07/04/2022 50  % Final   Lymphs Abs 07/04/2022 3.5  0.7 - 4.0 K/uL Final   Monocytes Relative 07/04/2022 7  % Final   Monocytes Absolute 07/04/2022 0.5  0.1 - 1.0 K/uL Final   Eosinophils Relative 07/04/2022 0  % Final   Eosinophils Absolute 07/04/2022 0.0  0.0 - 0.5 K/uL Final   Basophils Relative 07/04/2022 0  % Final   Basophils Absolute 07/04/2022 0.0  0.0 - 0.1 K/uL Final   Immature Granulocytes 07/04/2022 0  % Final   Abs Immature Granulocytes 07/04/2022 0.01  0.00 - 0.07 K/uL Final   Performed at [facility], [address] [place], [place] [address]   I-stat hCG, quantitative 07/04/2022 <5.0  <5 mIU/mL Final   Comment 3 07/04/2022          Final   Comment:   GEST. AGE      CONC.  (mIU/mL)   <=1 WEEK        5 - 50     2 WEEKS       50 - 500     3 WEEKS       100 - 10,000     4 WEEKS     1,000 - 30,000        FEMALE AND NON-PREGNANT FEMALE:     LESS THAN 5 mIU/mL   Admission on 06/14/2022, Discharged on 06/14/2022  Component Date Value Ref Range Status   Color, UA 06/14/2022 yellow  yellow Final   Clarity, UA 06/14/2022 cloudy (A)  clear Final   Glucose, UA 06/14/2022 negative  negative mg/dL Final   Bilirubin, UA 69/48/5462 negative  negative Final   Ketones, POC UA 06/14/2022 negative  negative mg/dL Final   Spec Grav, UA 70/35/0093 1.025  1.010 - 1.025 Final   Blood, UA 06/14/2022 negative  negative Final   pH, UA 06/14/2022 7.5  5.0 - 8.0 Final   Protein Ur, POC 06/14/2022 =30 (A)  negative mg/dL Final   Urobilinogen, UA 06/14/2022 0.2  0.2 or 1.0 E.U./dL Final   Nitrite, UA 81/82/9937 Negative  Negative Final    Leukocytes, UA 06/14/2022 Negative  Negative Final   Preg Test, Ur 06/14/2022 Negative  Negative Final   Specimen Description 06/14/2022 URINE, CLEAN CATCH   Final   Special Requests 06/14/2022    Final                   Value:NONE Performed at [facility], [address], [place], [place] [address]    Culture 06/14/2022 MULTIPLE SPECIES PRESENT, SUGGEST RECOLLECTION (A)   Final   Report Status 06/14/2022 06/16/2022 FINAL   Final  Admission on 06/07/2022, Discharged on 06/10/2022  Component Date Value Ref Range Status   SARS Coronavirus 2 by RT PCR 06/07/2022 NEGATIVE  NEGATIVE Final   Comment: (NOTE) SARS-CoV-2 target nucleic acids are NOT DETECTED.  The SARS-CoV-2 RNA is generally detectable in upper respiratory specimens during the acute phase of infection. The lowest concentration of SARS-CoV-2 viral copies this assay can detect  is 138 copies/mL. A negative result does not preclude SARS-Cov-2 infection and should not be used as the sole basis for treatment or other patient management decisions. A negative result may occur with  improper specimen collection/handling, submission of specimen other than nasopharyngeal swab, presence of viral mutation(s) within the areas targeted by this assay, and inadequate number of viral copies(<138 copies/mL). A negative result must be combined with clinical observations, patient history, and epidemiological information. The expected result is Negative.  Fact Sheet for Patients:  BloggerCourse.com  Fact Sheet for Healthcare Providers:  SeriousBroker.it  This test is no                          t yet approved or cleared by the [place] FDA and  has been authorized for detection and/or diagnosis of SARS-CoV-2 by FDA under an Emergency Use Authorization (EUA). This EUA will remain  in effect (meaning this test can be used) for the duration of the COVID-19 declaration under Section  564(b)(1) of the Act, 21 U.S.C.section 360bbb-3(b)(1), unless the authorization is terminated  or revoked sooner.       Influenza A by PCR 06/07/2022 NEGATIVE  NEGATIVE Final   Influenza B by PCR 06/07/2022 NEGATIVE  NEGATIVE Final   Comment: (NOTE) The Xpert Xpress SARS-CoV-2/FLU/RSV plus assay is intended as an aid in the diagnosis of influenza from Nasopharyngeal swab specimens and should not be used as a sole basis for treatment. Nasal washings and aspirates are unacceptable for Xpert Xpress SARS-CoV-2/FLU/RSV testing.  Fact Sheet for Patients: BloggerCourse.com  Fact Sheet for Healthcare Providers: SeriousBroker.it  This test is not yet approved or cleared by the [place] FDA and has been authorized for detection and/or diagnosis of SARS-CoV-2 by FDA under an Emergency Use Authorization (EUA). This EUA will remain in effect (meaning this test can be used) for the duration of the COVID-19 declaration under Section 564(b)(1) of the Act, 21 U.S.C. section 360bbb-3(b)(1), unless the authorization is terminated or revoked.  Performed at [facility], [address] [place], [place] [address]    WBC 06/07/2022 5.7  4.0 - 10.5 K/uL Final   RBC 06/07/2022 4.39  3.87 - 5.11 MIL/uL Final   Hemoglobin 06/07/2022 13.2  12.0 - 15.0 g/dL Final   HCT 62/13/0865 40.4  36.0 - 46.0 % Final   MCV 06/07/2022 92.0  80.0 - 100.0 fL Final   MCH 06/07/2022 30.1  26.0 - 34.0 pg Final   MCHC 06/07/2022 32.7  30.0 - 36.0 g/dL Final   RDW 78/46/9629 12.4  11.5 - 15.5 % Final   Platelets 06/07/2022 308  150 - 400 K/uL Final   nRBC 06/07/2022 0.0  0.0 - 0.2 % Final   Neutrophils Relative % 06/07/2022 42  % Final   Neutro Abs 06/07/2022 2.4  1.7 - 7.7 K/uL Final   Lymphocytes Relative 06/07/2022 54  % Final   Lymphs Abs 06/07/2022 3.1  0.7 - 4.0 K/uL Final   Monocytes Relative 06/07/2022 4  % Final   Monocytes Absolute 06/07/2022 0.2   0.1 - 1.0 K/uL Final   Eosinophils Relative 06/07/2022 0  % Final   Eosinophils Absolute 06/07/2022 0.0  0.0 - 0.5 K/uL Final   Basophils Relative 06/07/2022 0  % Final   Basophils Absolute 06/07/2022 0.0  0.0 - 0.1 K/uL Final   Immature Granulocytes 06/07/2022 0  % Final   Abs Immature Granulocytes 06/07/2022 0.01  0.00 - 0.07 K/uL  Final   Performed at [facility], [address] [place], [place] [address]   Sodium 06/07/2022 139  135 - 145 mmol/L Final   Potassium 06/07/2022 4.0  3.5 - 5.1 mmol/L Final   Chloride 06/07/2022 104  98 - 111 mmol/L Final   CO2 06/07/2022 28  22 - 32 mmol/L Final   Glucose, Bld 06/07/2022 104 (H)  70 - 99 mg/dL Final   Glucose reference range applies only to samples taken after fasting for at least 8 hours.   BUN 06/07/2022 8  6 - 20 mg/dL Final   Creatinine, Ser 06/07/2022 0.84  0.44 - 1.00 mg/dL Final   Calcium 60/45/4098 9.1  8.9 - 10.3 mg/dL Final   Total Protein 11/91/4782 6.9  6.5 - 8.1 g/dL Final   Albumin 95/62/1308 3.7  3.5 - 5.0 g/dL Final   AST 65/78/4696 19  15 - 41 U/L Final   ALT 06/07/2022 22  0 - 44 U/L Final   Alkaline Phosphatase 06/07/2022 54  38 - 126 U/L Final   Total Bilirubin 06/07/2022 0.4  0.3 - 1.2 mg/dL Final   GFR, Estimated 06/07/2022 >60  >60 mL/min Final   Comment: (NOTE) Calculated using the CKD-EPI Creatinine Equation (2021)    Anion gap 06/07/2022 7  5 - 15 Final   Performed at [facility], [address], [place], [place] [address]   Hgb A1c MFr Bld 06/07/2022 5.0  4.8 - 5.6 % Final   Comment: (NOTE) Pre diabetes:          5.7%-6.4%  Diabetes:              >6.4%  Glycemic control for   <7.0% adults with diabetes    Mean Plasma Glucose 06/07/2022 96.8  mg/dL Final   Performed at [facility], [address], [place], [place] [address]   TSH 06/07/2022 1.620  0.350 - 4.500 uIU/mL Final   Comment: Performed by a 3rd Generation assay with a functional sensitivity of <=0.01 uIU/mL. Performed at  [facility], [address], [place], [place] [address]    RPR Ser Ql 06/07/2022 NON REACTIVE  NON REACTIVE Final   Performed at [facility], [address], [place], [place] [address]   Color, Urine 06/07/2022 YELLOW  YELLOW Final   APPearance 06/07/2022 HAZY (A)  CLEAR Final   Specific Gravity, Urine 06/07/2022 1.018  1.005 - 1.030 Final   pH 06/07/2022 7.0  5.0 - 8.0 Final   Glucose, UA 06/07/2022 NEGATIVE  NEGATIVE mg/dL Final   Hgb urine dipstick 06/07/2022 NEGATIVE  NEGATIVE Final   Bilirubin Urine 06/07/2022 NEGATIVE  NEGATIVE Final   Ketones, ur 06/07/2022 NEGATIVE  NEGATIVE mg/dL Final   Protein, ur 64/40/3474 NEGATIVE  NEGATIVE mg/dL Final   Nitrite 25/95/6387 NEGATIVE  NEGATIVE Final   Leukocytes,Ua 06/07/2022 NEGATIVE  NEGATIVE Final   Performed at [facility], [address] [place], [place] [address]   Cholesterol 06/07/2022 164  0 - 200 mg/dL Final   Triglycerides 29/51/8841 158 (H)  <150 mg/dL Final   HDL 66/03/3015 44  >40 mg/dL Final   Total CHOL/HDL Ratio 06/07/2022 3.7  RATIO Final   VLDL 06/07/2022 32  0 - 40 mg/dL Final   LDL Cholesterol 06/07/2022 88  0 - 99 mg/dL Final   Comment:        Total Cholesterol/HDL:CHD Risk Coronary Heart Disease Risk Table  Men   Women  1/2 Average Risk   3.4   3.3  Average Risk       5.0   4.4  2 X Average Risk   9.6   7.1  3 X Average Risk  23.4   11.0        Use the calculated Patient Ratio above and the CHD Risk Table to determine the patient's CHD Risk.        ATP III CLASSIFICATION (LDL):  <100     mg/dL   Optimal  161-096  mg/dL   Near or Above                    Optimal  130-159  mg/dL   Borderline  045-409  mg/dL   High  >811     mg/dL   Very High Performed at [facility], [address], [place], [place] [address]    HIV Screen 4th Generation wRfx 06/07/2022 Non Reactive  Non Reactive Final   Performed at [facility], [address], [place], [place]  [address]   SARSCOV2ONAVIRUS 2 AG 06/07/2022 NEGATIVE  NEGATIVE Final   Comment: (NOTE) SARS-CoV-2 antigen NOT DETECTED.   Negative results are presumptive.  Negative results do not preclude SARS-CoV-2 infection and should not be used as the sole basis for treatment or other patient management decisions, including infection  control decisions, particularly in the presence of clinical signs and  symptoms consistent with COVID-19, or in those who have been in contact with the virus.  Negative results must be combined with clinical observations, patient history, and epidemiological information. The expected result is Negative.  Fact Sheet for Patients: https://www.jennings-kim.com/  Fact Sheet for Healthcare Providers: https://alexander-rogers.biz/  This test is not yet approved or cleared by the [place] FDA and  has been authorized for detection and/or diagnosis of SARS-CoV-2 by FDA under an Emergency Use Authorization (EUA).  This EUA will remain in effect (meaning this test can be used) for the duration of  the COV                          ID-19 declaration under Section 564(b)(1) of the Act, 21 U.S.C. section 360bbb-3(b)(1), unless the authorization is terminated or revoked sooner.     POC Amphetamine UR 06/07/2022 None Detected  NONE DETECTED (Cut Off Level 1000 ng/mL) Final   POC Secobarbital (BAR) 06/07/2022 None Detected  NONE DETECTED (Cut Off Level 300 ng/mL) Final   POC Buprenorphine (BUP) 06/07/2022 None Detected  NONE DETECTED (Cut Off Level 10 ng/mL) Final   POC Oxazepam (BZO) 06/07/2022 None Detected  NONE DETECTED (Cut Off Level 300 ng/mL) Final   POC Cocaine UR 06/07/2022 None Detected  NONE DETECTED (Cut Off Level 300 ng/mL) Final   POC Methamphetamine UR 06/07/2022 None Detected  NONE DETECTED (Cut Off Level 1000 ng/mL) Final   POC Morphine 06/07/2022 None Detected  NONE DETECTED (Cut Off Level 300 ng/mL) Final   POC Methadone UR  06/07/2022 None Detected  NONE DETECTED (Cut Off Level 300 ng/mL) Final   POC Oxycodone UR 06/07/2022 None Detected  NONE DETECTED (Cut Off Level 100 ng/mL) Final   POC Marijuana UR 06/07/2022 None Detected  NONE DETECTED (Cut Off Level 50 ng/mL) Final   Preg Test, Ur 06/08/2022 NEGATIVE  NEGATIVE Final   Comment:        THE SENSITIVITY OF THIS METHODOLOGY IS >20 mIU/mL. Performed at  [facility], [address] [place], [place] [address]    Preg Test, Ur 06/08/2022 NEGATIVE  NEGATIVE Final   Comment:        THE SENSITIVITY OF THIS METHODOLOGY IS >24 mIU/mL     Blood Alcohol level:  Lab Results  Component Value Date   ETH <10 07/04/2022   ETH <10 02/07/2022    Metabolic Disorder Labs: Lab Results  Component Value Date   HGBA1C 5.0 06/07/2022   MPG 96.8 06/07/2022   MPG 96.8 02/07/2022   No results found for: "PROLACTIN" Lab Results  Component Value Date   CHOL 181 07/25/2022   TRIG 118 07/25/2022   HDL 45 07/25/2022   CHOLHDL 4.0 07/25/2022   VLDL 24 07/25/2022   LDLCALC 112 (H) 07/25/2022   LDLCALC 88 06/07/2022    Therapeutic Lab Levels: No results found for: "LITHIUM" No results found for: "VALPROATE" No results found for: "CBMZ"  Physical Findings   GAD-7    Flowsheet Row [facility]17/2021 [facility]  Total GAD-7 Score 17      PHQ2-9    Flowsheet Row ED from 06/07/2022 in [facility] Office Visit from 12/17/2021 in [facility]  PHQ-2 Total Score 1 2  PHQ-9 Total Score 2 8      Flowsheet Row ED from 07/25/2022 in [facility] ED from 07/04/2022 in [facility] from 06/14/2022 in [facility]   C-SSRS RISK CATEGORY No Risk No Risk No Risk        Musculoskeletal  Strength & Muscle Tone: within normal limits Gait & Station: normal Patient leans:  N/A  Psychiatric Specialty Exam  Presentation  General Appearance:  Appropriate for Environment; Casual  Eye Contact: Good  Speech: Clear and Coherent; Normal Rate  Speech Volume: Normal  Handedness: Right   Mood and Affect  Mood: Euthymic  Affect: Appropriate; Congruent   Thought Process  Thought Processes: Coherent; Goal Directed; Linear  Descriptions of Associations:Intact  Orientation:Full (Time, Place and Person)  Thought Content:Logical; WDL  Diagnosis of Schizophrenia or Schizoaffective disorder in past: Yes    Hallucinations:Hallucinations: None  Ideas of Reference:None  Suicidal Thoughts:Suicidal Thoughts: No  Homicidal Thoughts:Homicidal Thoughts: No   Sensorium  Memory: Immediate Good; Recent Fair  Judgment: Fair  Insight: Present   Executive Functions  Concentration: Good  Attention Span: Good  Recall: Good  Fund of Knowledge: Fair  Language: Fair   Psychomotor Activity  Psychomotor Activity:Psychomotor Activity: Normal   Assets  Assets: Communication Skills; Desire for Improvement; Financial Resources/Insurance; Physical Health; Resilience   Sleep  Sleep: Sleep: Good   No data recorded  Physical Exam  Physical Exam Constitutional:      General: She is not in acute distress.    Appearance: She is not ill-appearing, toxic-appearing or diaphoretic.  Eyes:     General: No scleral icterus. Cardiovascular:     Rate and Rhythm: Tachycardia present.  Pulmonary:     Effort: Pulmonary effort is normal. No respiratory distress.  Neurological:     Mental Status: She is alert and oriented to person, place, and time.  Psychiatric:        Attention and Perception: Attention and perception normal.        Mood and Affect: Mood and affect normal.        Speech: Speech normal.  Behavior: Behavior normal. Behavior is cooperative.        Thought Content: Thought content normal.    Review of Systems   Constitutional:  Negative for chills and fever.  Respiratory:  Negative for shortness of breath.   Cardiovascular:  Negative for chest pain and palpitations.  Gastrointestinal:  Negative for abdominal pain.  Neurological:  Negative for headaches.   Blood pressure (!) 121/92, pulse (!) 112, temperature 98.5 F (36.9 C), temperature source Oral, resp. rate 18, SpO2 100 %. There is no height or weight on file to calculate BMI.  Treatment Plan Summary: Plan Pt remains psychiatrically cleared. Disposition is pending placement.  Lauree Chandler, NP 10/03/2022 2:53 PM

## 2022-10-03 NOTE — ED Notes (Signed)
Pt is in the bed watching TV. Respirations are even and unlabored. No acute distress noted. Will continue to monitor for safety.  

## 2022-10-03 NOTE — ED Notes (Signed)
Pt is sleeping. No distress noted. Will continue to monitor safety. 

## 2022-10-03 NOTE — Care Management (Signed)
OBS Care Management   Writer followed up with Dr. Gretta Cool regarding a letter endorsing an I/DD diagnosis before the age of 80 for the patient.  Writer provided Dr. Wilmer Floor with supporting documentation from the [facility] to review so that she is able to writer the letter.    Writer is waiting for Dr. Gretta Cool to provide me with the letter to submit to the [facility].

## 2022-10-04 NOTE — ED Notes (Signed)
Pt is awake and alert. Good eye contact. Denies SI, HI or AVH  pt asked about her disposition and then asked for hygiene supplies.  She was given these and is getting ready to take a shower.  No distress noted.

## 2022-10-04 NOTE — Care Management (Signed)
OBS Care Management   Tx Team Meeting   In attendance: Group Home Owner:  Gery Pray  IDD Supervisor: Hardie Shackleton IDD Care Manager: Helayne Seminole   During the meeting the following action items towards discharge to the Group Home were discussed:  The [facility] has scanned the requested letter regarding the patient's IDD status.  The MD at [facility] and the patient have signed the PCP.   Per the IDD Supervisor, Danate Christmas, the [facility] will be closed for three days for the Christmas holiday and this will present a delay in the patients paperwork being processed.   The Care Coordinator Rush Farmer will coordinate with the patient's legal guardian in order to obtain her signature on the PCP plan.   The Group Home owner, Gery Pray reports that once the funding authorization is complete then the patient is able to come into her facility,    The team will meet again on 10-12-2022 at 11am on WebEx

## 2022-10-04 NOTE — ED Notes (Signed)
Pt resting quietly.  Breathing even and unlabored.   Staff will continue to monitor for safety.  

## 2022-10-04 NOTE — ED Notes (Signed)
Pt is sleeping. No distress noted. Will continue to monitor safety. 

## 2022-10-05 LAB — URINALYSIS, COMPLETE (UACMP) WITH MICROSCOPIC
Bilirubin Urine: NEGATIVE
Glucose, UA: NEGATIVE mg/dL
Hgb urine dipstick: NEGATIVE
Ketones, ur: NEGATIVE mg/dL
Nitrite: NEGATIVE
Protein, ur: NEGATIVE mg/dL
Specific Gravity, Urine: 1.018 (ref 1.005–1.030)
pH: 5 (ref 5.0–8.0)

## 2022-10-05 NOTE — ED Notes (Signed)
Pt sitting on her bed eating cereal.she is calm and cooperative. No c/o pain or distress. Will continue to monitor for safety

## 2022-10-05 NOTE — ED Notes (Signed)
Pt is sleeping. No distress noted. Will continue to monitor safety. 

## 2022-10-05 NOTE — ED Notes (Signed)
Pt calm and listening to music with other pt on television. Pt has no c/o pain or distress. Alert and orient x 4. Will continue to monitor for safety

## 2022-10-05 NOTE — ED Provider Notes (Signed)
Behavioral Health Progress Note  Date and Time: 10/05/2022 3:29 PM Name: Nicole [name] MRN:  [id number]  Subjective:    Nicole [name] is a 31 y.o. female, with PMH of SCZ unspecified, MDD with psychosis, who presented voluntary to [facility] (07/25/2022) via [facility] after a verbal altercation with [name] (not patient's legal guardian) for transient SI.  This is patient's fourth visit to [facility] for similar concerns this year. Patient has been dismissed from previous group home due to threatening SI and HI, then leaving the group home.   Legal Guardian: [name]  On reassessment, pt reports euthymic mood, is pleasant and cooperative. Endorses adequate sleep and appetite. She denies any suicidal ideation. She denies homicidal ideation. Denies auditory and visual hallucinations. Reports she has been experiencing painful urination accompanied by mild abdominal pain and cloudy urine.   She denies any side effects to currently prescribed psychiatric medications. Denies any somatic symptoms except as stated above.   Diagnosis:  Final diagnoses:  Severe episode of recurrent major depressive disorder, with psychotic features (HCC)    Total Time spent with patient: 15 minutes  Past Psychiatric History: see HPI Past Medical History:  Past Medical History:  Diagnosis Date   Anxiety    Depression    Hypothyroidism 08/07/2022   Tobacco use disorder 08/07/2022    Past Surgical History:  Procedure Laterality Date   WISDOM TOOTH EXTRACTION Bilateral 2020   Family History:  Family History  Problem Relation Age of Onset   Hypertension Father    Diabetes Father    Family Psychiatric  History: None reported Social History:  Social History   Substance and Sexual Activity  Alcohol Use Never     Social History   Substance and Sexual Activity  Drug Use Never    Social History   Socioeconomic History    Marital status: Single    Spouse name: Not on file   Number of children: Not on file   Years of education: Not on file   Highest education level: Not on file  Occupational History   Not on file  Tobacco Use   Smoking status: Every Day    Types: Cigarettes   Smokeless tobacco: Never  Vaping Use   Vaping Use: Some days  Substance and Sexual Activity   Alcohol use: Never   Drug use: Never   Sexual activity: Yes    Partners: Female    Birth control/protection: Implant  Other Topics Concern   Not on file  Social History Narrative   Not on file   Social Determinants of Health   Financial Resource Strain: Not on file  Food Insecurity: Not on file  Transportation Needs: Not on file  Physical Activity: Not on file  Stress: Not on file  Social Connections: Not on file   SDOH:  SDOH Screenings   Depression (PHQ2-9): Low Risk  (06/10/2022)  Tobacco Use: High Risk (09/30/2022)   Additional Social History:    Pain Medications: SEE MAR Prescriptions: SEE MAR Over the Counter: SEE MAR History of alcohol / drug use?: Yes Longest period of sobriety (when/how long): N/A Negative Consequences of Use:  (NONE) Withdrawal Symptoms: None Name of Substance 1: ETOH IN PAST--PT CURRENTLY DENIES 1 - Frequency: SOCIAL USE ETOH IN PAST 1 - Method of Aquiring: LEGAL 1- Route of Use: ORAL DRINK Name of Substance 2: NICOTINE-VAPE AND CIGARETTES 2 - Amount (size/oz): LESS THAN ONE PACK 2 -  Frequency: DAILY 2 - Method of Aquiring: LEGAL 2 - Route of Substance Use: ORAL SMOKE                Sleep: Good  Appetite:  Good  Current Medications:  Current Facility-Administered Medications  Medication Dose Route Frequency Provider Last Rate Last Admin   acetaminophen (TYLENOL) tablet 650 mg  650 mg Oral Q6H PRN Onuoha, Chinwendu V, NP   650 mg at 09/21/22 2200   ARIPiprazole ER (ABILIFY MAINTENA) injection 400 mg  400 mg Intramuscular Q28 days Princess Bruins, DO   400 mg at 09/27/22 1445    docusate sodium (COLACE) capsule 100 mg  100 mg Oral Daily [name], MD   100 mg at 10/05/22 0930   hydrOXYzine (ATARAX) tablet 25 mg  25 mg Oral TID PRN Onuoha, Chinwendu V, NP   25 mg at 10/02/22 2111   ibuprofen (ADVIL) tablet 400 mg  400 mg Oral Q6H PRN Onuoha, Chinwendu V, NP   400 mg at 09/29/22 1942   levothyroxine (SYNTHROID) tablet 100 mcg  100 mcg Oral Q0600 Princess Bruins, DO   100 mcg at 10/05/22 0555   loratadine (CLARITIN) tablet 10 mg  10 mg Oral Daily Rankin, Shuvon B, NP   10 mg at 10/05/22 1308   metFORMIN (GLUCOPHAGE) tablet 500 mg  500 mg Oral Q breakfast Park Pope, MD   500 mg at 10/05/22 0856   nicotine (NICODERM CQ - dosed in mg/24 hours) patch 14 mg  14 mg Transdermal Q0600 Onuoha, Chinwendu V, NP   14 mg at 10/04/22 0919   ondansetron (ZOFRAN-ODT) disintegrating tablet 4 mg  4 mg Oral Q8H PRN Sindy Guadeloupe, NP   4 mg at 09/30/22 2336   Oxcarbazepine (TRILEPTAL) tablet 300 mg  300 mg Oral BID Onuoha, Chinwendu V, NP   300 mg at 10/05/22 0929   pantoprazole (PROTONIX) EC tablet 40 mg  40 mg Oral Daily Park Pope, MD   40 mg at 10/05/22 0928   polyethylene glycol (MIRALAX / GLYCOLAX) packet 17 g  17 g Oral Daily PRN Ajibola, Ene A, NP   17 g at 10/04/22 1010   QUEtiapine (SEROQUEL) tablet 400 mg  400 mg Oral BID Onuoha, Chinwendu V, NP   400 mg at 10/05/22 0930   sertraline (ZOLOFT) tablet 150 mg  150 mg Oral Daily Onuoha, Chinwendu V, NP   150 mg at 10/05/22 0928   simethicone (MYLICON) chewable tablet 80 mg  80 mg Oral Q6H PRN Sindy Guadeloupe, NP   80 mg at 09/29/22 2144   traZODone (DESYREL) tablet 100 mg  100 mg Oral QHS Vernard Gambles H, NP   100 mg at 10/04/22 2126   valACYclovir (VALTREX) tablet 500 mg  500 mg Oral Daily Princess Bruins, DO   500 mg at 10/05/22 0930   ziprasidone (GEODON) injection 20 mg  20 mg Intramuscular Q12H PRN Onuoha, Chinwendu V, NP   20 mg at 09/19/22 2133   Current Outpatient Medications  Medication Sig Dispense Refill   ABILIFY  MAINTENA 400 MG PRSY prefilled syringe 400 mg every 28 (twenty-eight) days.     cetirizine (ZYRTEC) 10 MG tablet Take 10 mg by mouth daily.     cyclobenzaprine (FLEXERIL) 10 MG tablet Take 1 tablet (10 mg total) by mouth 2 (two) times daily as needed for muscle spasms. 20 tablet 0   fluticasone (FLONASE) 50 MCG/ACT nasal spray Place 1 spray into both nostrils daily.     meloxicam (MOBIC) 15  MG tablet Take 15 mg by mouth daily.     Oxcarbazepine (TRILEPTAL) 300 MG tablet Take 1 tablet (300 mg total) by mouth 2 (two) times daily. 60 tablet 0   QUEtiapine (SEROQUEL) 400 MG tablet Take 1 tablet (400 mg total) by mouth 2 (two) times daily. 60 tablet 0   sertraline (ZOLOFT) 50 MG tablet Take 3 tablets (150 mg total) by mouth in the morning. 90 tablet 0   traZODone (DESYREL) 100 MG tablet Take 1 tablet (100 mg total) by mouth at bedtime. 30 tablet 0   valACYclovir (VALTREX) 500 MG tablet Take 500 mg by mouth daily.      Labs  Lab Results:  Admission on 07/25/2022  Component Date Value Ref Range Status   SARS Coronavirus 2 by RT PCR 07/25/2022 NEGATIVE  NEGATIVE Final   Comment: (NOTE) SARS-CoV-2 target nucleic acids are NOT DETECTED.  The SARS-CoV-2 RNA is generally detectable in upper respiratory specimens during the acute phase of infection. The lowest concentration of SARS-CoV-2 viral copies this assay can detect is 138 copies/mL. A negative result does not preclude SARS-Cov-2 infection and should not be used as the sole basis for treatment or other patient management decisions. A negative result may occur with  improper specimen collection/handling, submission of specimen other than nasopharyngeal swab, presence of viral mutation(s) within the areas targeted by this assay, and inadequate number of viral copies(<138 copies/mL). A negative result must be combined with clinical observations, patient history, and epidemiological information. The expected result is Negative.  Fact Sheet for  Patients:  BloggerCourse.com  Fact Sheet for Healthcare Providers:  SeriousBroker.it  This test is no                          t yet approved or cleared by the [place] FDA and  has been authorized for detection and/or diagnosis of SARS-CoV-2 by FDA under an Emergency Use Authorization (EUA). This EUA will remain  in effect (meaning this test can be used) for the duration of the COVID-19 declaration under Section 564(b)(1) of the Act, 21 U.S.C.section 360bbb-3(b)(1), unless the authorization is terminated  or revoked sooner.       Influenza A by PCR 07/25/2022 NEGATIVE  NEGATIVE Final   Influenza B by PCR 07/25/2022 NEGATIVE  NEGATIVE Final   Comment: (NOTE) The Xpert Xpress SARS-CoV-2/FLU/RSV plus assay is intended as an aid in the diagnosis of influenza from Nasopharyngeal swab specimens and should not be used as a sole basis for treatment. Nasal washings and aspirates are unacceptable for Xpert Xpress SARS-CoV-2/FLU/RSV testing.  Fact Sheet for Patients: BloggerCourse.com  Fact Sheet for Healthcare Providers: SeriousBroker.it  This test is not yet approved or cleared by the [place] FDA and has been authorized for detection and/or diagnosis of SARS-CoV-2 by FDA under an Emergency Use Authorization (EUA). This EUA will remain in effect (meaning this test can be used) for the duration of the COVID-19 declaration under Section 564(b)(1) of the Act, 21 U.S.C. section 360bbb-3(b)(1), unless the authorization is terminated or revoked.  Performed at [facility], [address], [place], [place] [address]    WBC 07/25/2022 8.3  4.0 - 10.5 K/uL Final   RBC 07/25/2022 4.44  3.87 - 5.11 MIL/uL Final   Hemoglobin 07/25/2022 13.7  12.0 - 15.0 g/dL Final   HCT 60/45/4098 40.2  36.0 - 46.0 % Final   MCV 07/25/2022 90.5  80.0 - 100.0 fL Final   MCH  07/25/2022 30.9   26.0 - 34.0 pg Final   MCHC 07/25/2022 34.1  30.0 - 36.0 g/dL Final   RDW 40/98/1191 12.2  11.5 - 15.5 % Final   Platelets 07/25/2022 248  150 - 400 K/uL Final   nRBC 07/25/2022 0.0  0.0 - 0.2 % Final   Neutrophils Relative % 07/25/2022 43  % Final   Neutro Abs 07/25/2022 3.6  1.7 - 7.7 K/uL Final   Lymphocytes Relative 07/25/2022 52  % Final   Lymphs Abs 07/25/2022 4.2 (H)  0.7 - 4.0 K/uL Final   Monocytes Relative 07/25/2022 5  % Final   Monocytes Absolute 07/25/2022 0.4  0.1 - 1.0 K/uL Final   Eosinophils Relative 07/25/2022 0  % Final   Eosinophils Absolute 07/25/2022 0.0  0.0 - 0.5 K/uL Final   Basophils Relative 07/25/2022 0  % Final   Basophils Absolute 07/25/2022 0.0  0.0 - 0.1 K/uL Final   Immature Granulocytes 07/25/2022 0  % Final   Abs Immature Granulocytes 07/25/2022 0.02  0.00 - 0.07 K/uL Final   Performed at [facility], [address], [place], [place] [address]   Sodium 07/25/2022 138  135 - 145 mmol/L Final   Potassium 07/25/2022 4.0  3.5 - 5.1 mmol/L Final   Chloride 07/25/2022 104  98 - 111 mmol/L Final   CO2 07/25/2022 29  22 - 32 mmol/L Final   Glucose, Bld 07/25/2022 83  70 - 99 mg/dL Final   Glucose reference range applies only to samples taken after fasting for at least 8 hours.   BUN 07/25/2022 11  6 - 20 mg/dL Final   Creatinine, Ser 07/25/2022 0.97  0.44 - 1.00 mg/dL Final   Calcium 56/21/3086 9.2  8.9 - 10.3 mg/dL Final   Total Protein 57/84/6962 7.0  6.5 - 8.1 g/dL Final   Albumin 95/28/4132 3.8  3.5 - 5.0 g/dL Final   AST 44/10/270 18  15 - 41 U/L Final   ALT 07/25/2022 22  0 - 44 U/L Final   Alkaline Phosphatase 07/25/2022 64  38 - 126 U/L Final   Total Bilirubin 07/25/2022 0.2 (L)  0.3 - 1.2 mg/dL Final   GFR, Estimated 07/25/2022 >60  >60 mL/min Final   Comment: (NOTE) Calculated using the CKD-EPI Creatinine Equation (2021)    Anion gap 07/25/2022 5  5 - 15 Final   Performed at [facility], [address] [place], [place]  [address]   POC Amphetamine UR 07/25/2022 None Detected  NONE DETECTED (Cut Off Level 1000 ng/mL) Preliminary   POC Secobarbital (BAR) 07/25/2022 None Detected  NONE DETECTED (Cut Off Level 300 ng/mL) Preliminary   POC Buprenorphine (BUP) 07/25/2022 None Detected  NONE DETECTED (Cut Off Level 10 ng/mL) Preliminary   POC Oxazepam (BZO) 07/25/2022 None Detected  NONE DETECTED (Cut Off Level 300 ng/mL) Preliminary   POC Cocaine UR 07/25/2022 None Detected  NONE DETECTED (Cut Off Level 300 ng/mL) Preliminary   POC Methamphetamine UR 07/25/2022 None Detected  NONE DETECTED (Cut Off Level 1000 ng/mL) Preliminary   POC Morphine 07/25/2022 None Detected  NONE DETECTED (Cut Off Level 300 ng/mL) Preliminary   POC Methadone UR 07/25/2022 None Detected  NONE DETECTED (Cut Off Level 300 ng/mL) Preliminary   POC Oxycodone UR 07/25/2022 None Detected  NONE DETECTED (Cut Off Level 100 ng/mL) Preliminary   POC Marijuana UR 07/25/2022 None Detected  NONE DETECTED (Cut Off Level 50 ng/mL) Preliminary   SARSCOV2ONAVIRUS 2 AG 07/25/2022 NEGATIVE  NEGATIVE Final   Comment: (NOTE) SARS-CoV-2 antigen NOT DETECTED.   Negative results are presumptive.  Negative results do not preclude SARS-CoV-2 infection and should not be used as the sole basis for treatment or other patient management decisions, including infection  control decisions, particularly in the presence of clinical signs and  symptoms consistent with COVID-19, or in those who have been in contact with the virus.  Negative results must be combined with clinical observations, patient history, and epidemiological information. The expected result is Negative.  Fact Sheet for Patients: https://www.jennings-kim.com/  Fact Sheet for Healthcare Providers: https://alexander-rogers.biz/  This test is not yet approved or cleared by the [place] FDA and  has been authorized for detection and/or diagnosis of SARS-CoV-2 by FDA under an  Emergency Use Authorization (EUA).  This EUA will remain in effect (meaning this test can be used) for the duration of  the COV                          ID-19 declaration under Section 564(b)(1) of the Act, 21 U.S.C. section 360bbb-3(b)(1), unless the authorization is terminated or revoked sooner.     Cholesterol 07/25/2022 181  0 - 200 mg/dL Final   Triglycerides 16/07/9603 118  <150 mg/dL Final   HDL 54/06/8118 45  >40 mg/dL Final   Total CHOL/HDL Ratio 07/25/2022 4.0  RATIO Final   VLDL 07/25/2022 24  0 - 40 mg/dL Final   LDL Cholesterol 07/25/2022 112 (H)  0 - 99 mg/dL Final   Comment:        Total Cholesterol/HDL:CHD Risk Coronary Heart Disease Risk Table                     Men   Women  1/2 Average Risk   3.4   3.3  Average Risk       5.0   4.4  2 X Average Risk   9.6   7.1  3 X Average Risk  23.4   11.0        Use the calculated Patient Ratio above and the CHD Risk Table to determine the patient's CHD Risk.        ATP III CLASSIFICATION (LDL):  <100     mg/dL   Optimal  147-829  mg/dL   Near or Above                    Optimal  130-159  mg/dL   Borderline  562-130  mg/dL   High  >865     mg/dL   Very High Performed at [facility], [address], [place], [place] [address]    TSH 07/25/2022 6.668 (H)  0.350 - 4.500 uIU/mL Final   Comment: Performed by a 3rd Generation assay with a functional sensitivity of <=0.01 uIU/mL. Performed at [facility], [address], [place], [place] [address]    Glucose-Capillary 07/26/2022 104 (H)  70 - 99 mg/dL Final   Glucose reference range applies only to samples taken after fasting for at least 8 hours.   T3, Free 07/31/2022 2.3  2.0 - 4.4 pg/mL Final   Comment: (NOTE) Performed At: [facility] [address] [place], [place] [id number] Jolene Schimke MD UU:[id number]    Free T4 07/31/2022 0.60 (L)  0.61 - 1.12 ng/dL Final   Comment: (NOTE) Biotin ingestion may interfere with free T4 tests. If the  results are inconsistent with the  TSH level, previous test results, or the clinical presentation, then consider biotin interference. If needed, order repeat testing after stopping biotin. Performed at [facility], [address], [place], [place] [address]    Glucose-Capillary 08/29/2022 100 (H)  70 - 99 mg/dL Final   Glucose reference range applies only to samples taken after fasting for at least 8 hours.   TSH 09/05/2022 0.793  0.350 - 4.500 uIU/mL Final   Comment: Performed by a 3rd Generation assay with a functional sensitivity of <=0.01 uIU/mL. Performed at [facility], [address] [place], [place] [address]    Free T4 09/05/2022 0.73  0.61 - 1.12 ng/dL Final   Comment: (NOTE) Biotin ingestion may interfere with free T4 tests. If the results are inconsistent with the TSH level, previous test results, or the clinical presentation, then consider biotin interference. If needed, order repeat testing after stopping biotin. Performed at [facility] [place] [facility], [address] [place], [place] [address]    Preg Test, Ur 09/06/2022 Negative  Negative Final   Preg Test, Ur 09/06/2022 NEGATIVE  NEGATIVE Final   Comment:        THE SENSITIVITY OF THIS METHODOLOGY IS >24 mIU/mL    Preg Test, Ur 09/05/2022 NEGATIVE  NEGATIVE Final   Comment:        THE SENSITIVITY OF THIS METHODOLOGY IS >24 mIU/mL    Sodium 09/13/2022 136  135 - 145 mmol/L Final   Potassium 09/13/2022 4.5  3.5 - 5.1 mmol/L Final   Chloride 09/13/2022 105  98 - 111 mmol/L Final   CO2 09/13/2022 21 (L)  22 - 32 mmol/L Final   Glucose, Bld 09/13/2022 111 (H)  70 - 99 mg/dL Final   Glucose reference range applies only to samples taken after fasting for at least 8 hours.   BUN 09/13/2022 14  6 - 20 mg/dL Final   Creatinine, Ser 09/13/2022 0.92  0.44 - 1.00 mg/dL Final   Calcium 21/30/8657 9.1  8.9 - 10.3 mg/dL Final   GFR, Estimated 09/13/2022 >60  >60 mL/min Final   Comment: (NOTE) Calculated using the  CKD-EPI Creatinine Equation (2021)    Anion gap 09/13/2022 10  5 - 15 Final   Performed at [facility], [address] [place], [place] [address]   Vitamin B-12 09/13/2022 585  180 - 914 pg/mL Final   Comment: (NOTE) This assay is not validated for testing neonatal or myeloproliferative syndrome specimens for Vitamin B12 levels. Performed at [facility], [address] [place], [place] [address]    Color, Urine 09/14/2022 YELLOW  YELLOW Final   APPearance 09/14/2022 HAZY (A)  CLEAR Final   Specific Gravity, Urine 09/14/2022 1.025  1.005 - 1.030 Final   pH 09/14/2022 5.0  5.0 - 8.0 Final   Glucose, UA 09/14/2022 NEGATIVE  NEGATIVE mg/dL Final   Hgb urine dipstick 09/14/2022 NEGATIVE  NEGATIVE Final   Bilirubin Urine 09/14/2022 NEGATIVE  NEGATIVE Final   Ketones, ur 09/14/2022 NEGATIVE  NEGATIVE mg/dL Final   Protein, ur 41/32/4401 NEGATIVE  NEGATIVE mg/dL Final   Nitrite 02/72/5366 NEGATIVE  NEGATIVE Final   Leukocytes,Ua 09/14/2022 TRACE (A)  NEGATIVE Final   RBC / HPF 09/14/2022 0-5  0 - 5 RBC/hpf Final   WBC, UA 09/14/2022 0-5  0 - 5 WBC/hpf Final   Bacteria, UA 09/14/2022 RARE (A)  NONE SEEN Final   Squamous Epithelial / LPF 09/14/2022 0-5  0 - 5 Final  Mucus 09/14/2022 PRESENT   Final   Performed at [facility], [address] [place], [place] [address]   Glucose-Capillary 09/16/2022 105 (H)  70 - 99 mg/dL Final   Glucose reference range applies only to samples taken after fasting for at least 8 hours.   SARS Coronavirus 2 by RT PCR 09/30/2022 NEGATIVE  NEGATIVE Final   Comment: (NOTE) SARS-CoV-2 target nucleic acids are NOT DETECTED.  The SARS-CoV-2 RNA is generally detectable in upper respiratory specimens during the acute phase of infection. The lowest concentration of SARS-CoV-2 viral copies this assay can detect is 138 copies/mL. A negative result does not preclude SARS-Cov-2 infection and should not be used as the sole basis for treatment  or other patient management decisions. A negative result may occur with  improper specimen collection/handling, submission of specimen other than nasopharyngeal swab, presence of viral mutation(s) within the areas targeted by this assay, and inadequate number of viral copies(<138 copies/mL). A negative result must be combined with clinical observations, patient history, and epidemiological information. The expected result is Negative.  Fact Sheet for Patients:  BloggerCourse.com  Fact Sheet for Healthcare Providers:  SeriousBroker.it  This test is no                          t yet approved or cleared by the [place] FDA and  has been authorized for detection and/or diagnosis of SARS-CoV-2 by FDA under an Emergency Use Authorization (EUA). This EUA will remain  in effect (meaning this test can be used) for the duration of the COVID-19 declaration under Section 564(b)(1) of the Act, 21 U.S.C.section 360bbb-3(b)(1), unless the authorization is terminated  or revoked sooner.       Influenza A by PCR 09/30/2022 NEGATIVE  NEGATIVE Final   Influenza B by PCR 09/30/2022 NEGATIVE  NEGATIVE Final   Comment: (NOTE) The Xpert Xpress SARS-CoV-2/FLU/RSV plus assay is intended as an aid in the diagnosis of influenza from Nasopharyngeal swab specimens and should not be used as a sole basis for treatment. Nasal washings and aspirates are unacceptable for Xpert Xpress SARS-CoV-2/FLU/RSV testing.  Fact Sheet for Patients: BloggerCourse.com  Fact Sheet for Healthcare Providers: SeriousBroker.it  This test is not yet approved or cleared by the [place] FDA and has been authorized for detection and/or diagnosis of SARS-CoV-2 by FDA under an Emergency Use Authorization (EUA). This EUA will remain in effect (meaning this test can be used) for the duration of the COVID-19 declaration under  Section 564(b)(1) of the Act, 21 U.S.C. section 360bbb-3(b)(1), unless the authorization is terminated or revoked.     Resp Syncytial Virus by PCR 09/30/2022 NEGATIVE  NEGATIVE Final   Comment: (NOTE) Fact Sheet for Patients: BloggerCourse.com  Fact Sheet for Healthcare Providers: SeriousBroker.it  This test is not yet approved or cleared by the [place] FDA and has been authorized for detection and/or diagnosis of SARS-CoV-2 by FDA under an Emergency Use Authorization (EUA). This EUA will remain in effect (meaning this test can be used) for the duration of the COVID-19 declaration under Section 564(b)(1) of the Act, 21 U.S.C. section 360bbb-3(b)(1), unless the authorization is terminated or revoked.  Performed at [facility], [address], [place], [place] [address]    Sodium 10/01/2022 136  135 - 145 mmol/L Final   Potassium 10/01/2022 3.8  3.5 - 5.1 mmol/L Final   Chloride 10/01/2022 104  98 - 111 mmol/L Final   CO2 10/01/2022  27  22 - 32 mmol/L Final   Glucose, Bld 10/01/2022 98  70 - 99 mg/dL Final   Glucose reference range applies only to samples taken after fasting for at least 8 hours.   BUN 10/01/2022 12  6 - 20 mg/dL Final   Creatinine, Ser 10/01/2022 0.98  0.44 - 1.00 mg/dL Final   Calcium 40/98/119112/19/2023 8.9  8.9 - 10.3 mg/dL Final   GFR, Estimated 10/01/2022 >60  >60 mL/min Final   Comment: (NOTE) Calculated using the CKD-EPI Creatinine Equation (2021)    Anion gap 10/01/2022 5  5 - 15 Final   Performed at [facility], [address] [place], [place] [address]   WBC 10/01/2022 5.1  4.0 - 10.5 K/uL Final   RBC 10/01/2022 4.31  3.87 - 5.11 MIL/uL Final   Hemoglobin 10/01/2022 12.7  12.0 - 15.0 g/dL Final   HCT 56/21/308612/19/2023 38.8  36.0 - 46.0 % Final   MCV 10/01/2022 90.0  80.0 - 100.0 fL Final   MCH 10/01/2022 29.5  26.0 - 34.0 pg Final   MCHC 10/01/2022 32.7  30.0 - 36.0 g/dL Final   RDW  57/84/696212/19/2023 12.4  11.5 - 15.5 % Final   Platelets 10/01/2022 300  150 - 400 K/uL Final   nRBC 10/01/2022 0.0  0.0 - 0.2 % Final   Performed at [facility], [address] [place], [place] [address]   Lipase 10/01/2022 29  11 - 51 U/L Final   Performed at [facility], [address] [place], [place] [address]  Admission on 07/04/2022, Discharged on 07/05/2022  Component Date Value Ref Range Status   Sodium 07/04/2022 142  135 - 145 mmol/L Final   Potassium 07/04/2022 4.4  3.5 - 5.1 mmol/L Final   Chloride 07/04/2022 109  98 - 111 mmol/L Final   CO2 07/04/2022 26  22 - 32 mmol/L Final   Glucose, Bld 07/04/2022 96  70 - 99 mg/dL Final   Glucose reference range applies only to samples taken after fasting for at least 8 hours.   BUN 07/04/2022 15  6 - 20 mg/dL Final   Creatinine, Ser 07/04/2022 0.83  0.44 - 1.00 mg/dL Final   Calcium 01/02/725309/21/2023 9.3  8.9 - 10.3 mg/dL Final   Total Protein 66/44/034709/21/2023 7.2  6.5 - 8.1 g/dL Final   Albumin 42/59/563809/21/2023 3.9  3.5 - 5.0 g/dL Final   AST 75/64/332909/21/2023 23  15 - 41 U/L Final   ALT 07/04/2022 28  0 - 44 U/L Final   Alkaline Phosphatase 07/04/2022 77  38 - 126 U/L Final   Total Bilirubin 07/04/2022 0.4  0.3 - 1.2 mg/dL Final   GFR, Estimated 07/04/2022 >60  >60 mL/min Final   Comment: (NOTE) Calculated using the CKD-EPI Creatinine Equation (2021)    Anion gap 07/04/2022 7  5 - 15 Final   Performed at [facility], [address] [place], [place] [id number]   Alcohol, Ethyl (B) 07/04/2022 <10  <10 mg/dL Final   Comment: (NOTE) Lowest detectable limit for serum alcohol is 10 mg/dL.  For medical purposes only. Performed at [facility], [address] [place], [place] [address]    WBC 07/04/2022 7.0  4.0 - 10.5 K/uL Final   RBC 07/04/2022 4.18  3.87 - 5.11 MIL/uL Final   Hemoglobin 07/04/2022 12.8  12.0 - 15.0 g/dL Final   HCT 30/16/010909/21/2023 39.1  36.0 - 46.0 % Final   MCV 07/04/2022 93.5  80.0 - 100.0  fL Final   MCH 07/04/2022 30.6  26.0 - 34.0 pg Final   MCHC 07/04/2022 32.7  30.0 - 36.0 g/dL Final   RDW 16/07/9603 12.9  11.5 - 15.5 % Final   Platelets 07/04/2022 243  150 - 400 K/uL Final   nRBC 07/04/2022 0.0  0.0 - 0.2 % Final   Neutrophils Relative % 07/04/2022 43  % Final   Neutro Abs 07/04/2022 3.0  1.7 - 7.7 K/uL Final   Lymphocytes Relative 07/04/2022 50  % Final   Lymphs Abs 07/04/2022 3.5  0.7 - 4.0 K/uL Final   Monocytes Relative 07/04/2022 7  % Final   Monocytes Absolute 07/04/2022 0.5  0.1 - 1.0 K/uL Final   Eosinophils Relative 07/04/2022 0  % Final   Eosinophils Absolute 07/04/2022 0.0  0.0 - 0.5 K/uL Final   Basophils Relative 07/04/2022 0  % Final   Basophils Absolute 07/04/2022 0.0  0.0 - 0.1 K/uL Final   Immature Granulocytes 07/04/2022 0  % Final   Abs Immature Granulocytes 07/04/2022 0.01  0.00 - 0.07 K/uL Final   Performed at [facility], [address], [place], [place] [address]   I-stat hCG, quantitative 07/04/2022 <5.0  <5 mIU/mL Final   Comment 3 07/04/2022          Final   Comment:   GEST. AGE      CONC.  (mIU/mL)   <=1 WEEK        5 - 50     2 WEEKS       50 - 500     3 WEEKS       100 - 10,000     4 WEEKS     1,000 - 30,000        FEMALE AND NON-PREGNANT FEMALE:     LESS THAN 5 mIU/mL   Admission on 06/14/2022, Discharged on 06/14/2022  Component Date Value Ref Range Status   Color, UA 06/14/2022 yellow  yellow Final   Clarity, UA 06/14/2022 cloudy (A)  clear Final   Glucose, UA 06/14/2022 negative  negative mg/dL Final   Bilirubin, UA 11/91/4782 negative  negative Final   Ketones, POC UA 06/14/2022 negative  negative mg/dL Final   Spec Grav, UA 95/62/1308 1.025  1.010 - 1.025 Final   Blood, UA 06/14/2022 negative  negative Final   pH, UA 06/14/2022 7.5  5.0 - 8.0 Final   Protein Ur, POC 06/14/2022 =30 (A)  negative mg/dL Final   Urobilinogen, UA 06/14/2022 0.2  0.2 or 1.0 E.U./dL Final   Nitrite, UA 65/78/4696 Negative   Negative Final   Leukocytes, UA 06/14/2022 Negative  Negative Final   Preg Test, Ur 06/14/2022 Negative  Negative Final   Specimen Description 06/14/2022 URINE, CLEAN CATCH   Final   Special Requests 06/14/2022    Final                   Value:NONE Performed at [facility], [address], [place], [place] [address]    Culture 06/14/2022 MULTIPLE SPECIES PRESENT, SUGGEST RECOLLECTION (A)   Final   Report Status 06/14/2022 06/16/2022 FINAL   Final  Admission on 06/07/2022, Discharged on 06/10/2022  Component Date Value Ref Range Status   SARS Coronavirus 2 by RT PCR 06/07/2022 NEGATIVE  NEGATIVE Final   Comment: (NOTE) SARS-CoV-2 target nucleic acids are NOT DETECTED.  The SARS-CoV-2 RNA is generally detectable in upper respiratory specimens during the acute phase of  infection. The lowest concentration of SARS-CoV-2 viral copies this assay can detect is 138 copies/mL. A negative result does not preclude SARS-Cov-2 infection and should not be used as the sole basis for treatment or other patient management decisions. A negative result may occur with  improper specimen collection/handling, submission of specimen other than nasopharyngeal swab, presence of viral mutation(s) within the areas targeted by this assay, and inadequate number of viral copies(<138 copies/mL). A negative result must be combined with clinical observations, patient history, and epidemiological information. The expected result is Negative.  Fact Sheet for Patients:  BloggerCourse.com  Fact Sheet for Healthcare Providers:  SeriousBroker.it  This test is no                          t yet approved or cleared by the [place] FDA and  has been authorized for detection and/or diagnosis of SARS-CoV-2 by FDA under an Emergency Use Authorization (EUA). This EUA will remain  in effect (meaning this test can be used) for the duration of the COVID-19  declaration under Section 564(b)(1) of the Act, 21 U.S.C.section 360bbb-3(b)(1), unless the authorization is terminated  or revoked sooner.       Influenza A by PCR 06/07/2022 NEGATIVE  NEGATIVE Final   Influenza B by PCR 06/07/2022 NEGATIVE  NEGATIVE Final   Comment: (NOTE) The Xpert Xpress SARS-CoV-2/FLU/RSV plus assay is intended as an aid in the diagnosis of influenza from Nasopharyngeal swab specimens and should not be used as a sole basis for treatment. Nasal washings and aspirates are unacceptable for Xpert Xpress SARS-CoV-2/FLU/RSV testing.  Fact Sheet for Patients: BloggerCourse.com  Fact Sheet for Healthcare Providers: SeriousBroker.it  This test is not yet approved or cleared by the [place] FDA and has been authorized for detection and/or diagnosis of SARS-CoV-2 by FDA under an Emergency Use Authorization (EUA). This EUA will remain in effect (meaning this test can be used) for the duration of the COVID-19 declaration under Section 564(b)(1) of the Act, 21 U.S.C. section 360bbb-3(b)(1), unless the authorization is terminated or revoked.  Performed at [facility], [address] [place], [place] [address]    WBC 06/07/2022 5.7  4.0 - 10.5 K/uL Final   RBC 06/07/2022 4.39  3.87 - 5.11 MIL/uL Final   Hemoglobin 06/07/2022 13.2  12.0 - 15.0 g/dL Final   HCT 08/07/8526 40.4  36.0 - 46.0 % Final   MCV 06/07/2022 92.0  80.0 - 100.0 fL Final   MCH 06/07/2022 30.1  26.0 - 34.0 pg Final   MCHC 06/07/2022 32.7  30.0 - 36.0 g/dL Final   RDW 78/24/2353 12.4  11.5 - 15.5 % Final   Platelets 06/07/2022 308  150 - 400 K/uL Final   nRBC 06/07/2022 0.0  0.0 - 0.2 % Final   Neutrophils Relative % 06/07/2022 42  % Final   Neutro Abs 06/07/2022 2.4  1.7 - 7.7 K/uL Final   Lymphocytes Relative 06/07/2022 54  % Final   Lymphs Abs 06/07/2022 3.1  0.7 - 4.0 K/uL Final   Monocytes Relative 06/07/2022 4  % Final   Monocytes  Absolute 06/07/2022 0.2  0.1 - 1.0 K/uL Final   Eosinophils Relative 06/07/2022 0  % Final   Eosinophils Absolute 06/07/2022 0.0  0.0 - 0.5 K/uL Final   Basophils Relative 06/07/2022 0  % Final   Basophils Absolute 06/07/2022 0.0  0.0 - 0.1 K/uL Final   Immature Granulocytes 06/07/2022 0  % Final  Abs Immature Granulocytes 06/07/2022 0.01  0.00 - 0.07 K/uL Final   Performed at [facility], [address], [place], [place] [address]   Sodium 06/07/2022 139  135 - 145 mmol/L Final   Potassium 06/07/2022 4.0  3.5 - 5.1 mmol/L Final   Chloride 06/07/2022 104  98 - 111 mmol/L Final   CO2 06/07/2022 28  22 - 32 mmol/L Final   Glucose, Bld 06/07/2022 104 (H)  70 - 99 mg/dL Final   Glucose reference range applies only to samples taken after fasting for at least 8 hours.   BUN 06/07/2022 8  6 - 20 mg/dL Final   Creatinine, Ser 06/07/2022 0.84  0.44 - 1.00 mg/dL Final   Calcium 14/48/1856 9.1  8.9 - 10.3 mg/dL Final   Total Protein 31/49/7026 6.9  6.5 - 8.1 g/dL Final   Albumin 37/85/8850 3.7  3.5 - 5.0 g/dL Final   AST 27/74/1287 19  15 - 41 U/L Final   ALT 06/07/2022 22  0 - 44 U/L Final   Alkaline Phosphatase 06/07/2022 54  38 - 126 U/L Final   Total Bilirubin 06/07/2022 0.4  0.3 - 1.2 mg/dL Final   GFR, Estimated 06/07/2022 >60  >60 mL/min Final   Comment: (NOTE) Calculated using the CKD-EPI Creatinine Equation (2021)    Anion gap 06/07/2022 7  5 - 15 Final   Performed at [facility], [address] [place], [place] [address]   Hgb A1c MFr Bld 06/07/2022 5.0  4.8 - 5.6 % Final   Comment: (NOTE) Pre diabetes:          5.7%-6.4%  Diabetes:              >6.4%  Glycemic control for   <7.0% adults with diabetes    Mean Plasma Glucose 06/07/2022 96.8  mg/dL Final   Performed at [facility], [address] [place], [place] [address]   TSH 06/07/2022 1.620  0.350 - 4.500 uIU/mL Final   Comment: Performed by a 3rd Generation assay with a functional sensitivity of  <=0.01 uIU/mL. Performed at [facility], [address] [place], [place] [address]    RPR Ser Ql 06/07/2022 NON REACTIVE  NON REACTIVE Final   Performed at [facility], [address], [place], [place] [address]   Color, Urine 06/07/2022 YELLOW  YELLOW Final   APPearance 06/07/2022 HAZY (A)  CLEAR Final   Specific Gravity, Urine 06/07/2022 1.018  1.005 - 1.030 Final   pH 06/07/2022 7.0  5.0 - 8.0 Final   Glucose, UA 06/07/2022 NEGATIVE  NEGATIVE mg/dL Final   Hgb urine dipstick 06/07/2022 NEGATIVE  NEGATIVE Final   Bilirubin Urine 06/07/2022 NEGATIVE  NEGATIVE Final   Ketones, ur 06/07/2022 NEGATIVE  NEGATIVE mg/dL Final   Protein, ur 47/65/4650 NEGATIVE  NEGATIVE mg/dL Final   Nitrite 35/46/5681 NEGATIVE  NEGATIVE Final   Leukocytes,Ua 06/07/2022 NEGATIVE  NEGATIVE Final   Performed at [facility], [address], [place], [place] [address]   Cholesterol 06/07/2022 164  0 - 200 mg/dL Final   Triglycerides 00/17/4944 158 (H)  <150 mg/dL Final   HDL 96/75/9163 44  >40 mg/dL Final   Total CHOL/HDL Ratio 06/07/2022 3.7  RATIO Final   VLDL 06/07/2022 32  0 - 40 mg/dL Final   LDL Cholesterol 06/07/2022 88  0 - 99 mg/dL Final   Comment:        Total Cholesterol/HDL:CHD Risk Coronary Heart  Disease Risk Table                     Men   Women  1/2 Average Risk   3.4   3.3  Average Risk       5.0   4.4  2 X Average Risk   9.6   7.1  3 X Average Risk  23.4   11.0        Use the calculated Patient Ratio above and the CHD Risk Table to determine the patient's CHD Risk.        ATP III CLASSIFICATION (LDL):  <100     mg/dL   Optimal  846-962  mg/dL   Near or Above                    Optimal  130-159  mg/dL   Borderline  952-841  mg/dL   High  >324     mg/dL   Very High Performed at [facility], [address] [place], [place] [address]    HIV Screen 4th Generation wRfx 06/07/2022 Non Reactive  Non Reactive Final   Performed at [facility], [address], [place], [place] [address]   SARSCOV2ONAVIRUS 2 AG 06/07/2022 NEGATIVE  NEGATIVE Final   Comment: (NOTE) SARS-CoV-2 antigen NOT DETECTED.   Negative results are presumptive.  Negative results do not preclude SARS-CoV-2 infection and should not be used as the sole basis for treatment or other patient management decisions, including infection  control decisions, particularly in the presence of clinical signs and  symptoms consistent with COVID-19, or in those who have been in contact with the virus.  Negative results must be combined with clinical observations, patient history, and epidemiological information. The expected result is Negative.  Fact Sheet for Patients: https://www.jennings-kim.com/  Fact Sheet for Healthcare Providers: https://alexander-rogers.biz/  This test is not yet approved or cleared by the [place] FDA and  has been authorized for detection and/or diagnosis of SARS-CoV-2 by FDA under an Emergency Use Authorization (EUA).  This EUA will remain in effect (meaning this test can be used) for the duration of  the COV                          ID-19 declaration under Section 564(b)(1) of the Act, 21 U.S.C. section 360bbb-3(b)(1), unless the authorization is terminated or revoked sooner.     POC Amphetamine UR 06/07/2022 None Detected  NONE DETECTED (Cut Off Level 1000 ng/mL) Final   POC Secobarbital (BAR) 06/07/2022 None Detected  NONE DETECTED (Cut Off Level 300 ng/mL) Final   POC Buprenorphine (BUP) 06/07/2022 None Detected  NONE DETECTED (Cut Off Level 10 ng/mL) Final   POC Oxazepam (BZO) 06/07/2022 None Detected  NONE DETECTED (Cut Off Level 300 ng/mL) Final   POC Cocaine UR 06/07/2022 None Detected  NONE DETECTED (Cut Off Level 300 ng/mL) Final   POC Methamphetamine UR 06/07/2022 None Detected  NONE DETECTED (Cut Off Level 1000 ng/mL) Final   POC Morphine 06/07/2022 None Detected  NONE DETECTED (Cut Off Level 300 ng/mL) Final    POC Methadone UR 06/07/2022 None Detected  NONE DETECTED (Cut Off Level 300 ng/mL) Final   POC Oxycodone UR 06/07/2022 None Detected  NONE DETECTED (Cut Off Level 100 ng/mL) Final   POC Marijuana UR 06/07/2022 None Detected  NONE DETECTED (Cut Off Level 50 ng/mL) Final   Preg Test, Ur 06/08/2022 NEGATIVE  NEGATIVE Final   Comment:        THE SENSITIVITY OF THIS METHODOLOGY IS >20 mIU/mL. Performed at [facility], [address] [place], [place] [address]    Preg Test, Ur 06/08/2022 NEGATIVE  NEGATIVE Final   Comment:        THE SENSITIVITY OF THIS METHODOLOGY IS >24 mIU/mL     Blood Alcohol level:  Lab Results  Component Value Date   ETH <10 07/04/2022   ETH <10 02/07/2022    Metabolic Disorder Labs: Lab Results  Component Value Date   HGBA1C 5.0 06/07/2022   MPG 96.8 06/07/2022   MPG 96.8 02/07/2022   No results found for: "PROLACTIN" Lab Results  Component Value Date   CHOL 181 07/25/2022   TRIG 118 07/25/2022   HDL 45 07/25/2022   CHOLHDL 4.0 07/25/2022   VLDL 24 07/25/2022   LDLCALC 112 (H) 07/25/2022   LDLCALC 88 06/07/2022    Therapeutic Lab Levels: No results found for: "LITHIUM" No results found for: "VALPROATE" No results found for: "CBMZ"  Physical Findings   GAD-7    Flowsheet Row Office [facility]/17/2021 [facility]  Total GAD-7 Score 17      PHQ2-9    Flowsheet Row ED from 06/07/2022 in [facility] Office Visit from 12/17/2021 in [facility]  PHQ-2 Total Score 1 2  PHQ-9 Total Score 2 8      Flowsheet Row ED from 07/25/2022 in [facility] ED from 07/04/2022 in [facility] from 06/14/2022 in [facility]   C-SSRS RISK CATEGORY No Risk No Risk No Risk        Musculoskeletal  Strength & Muscle Tone: within normal limits Gait & Station: normal Patient  leans: N/A  Psychiatric Specialty Exam  Presentation  General Appearance:  Appropriate for Environment; Fairly Groomed  Eye Contact: Good  Speech: Clear and Coherent  Speech Volume: Normal  Handedness: Right   Mood and Affect  Mood: Euthymic  Affect: Appropriate; Congruent   Thought Process  Thought Processes: Goal Directed; Linear  Descriptions of Associations:Intact  Orientation:Full (Time, Place and Person)  Thought Content:Logical  Diagnosis of Schizophrenia or Schizoaffective disorder in past: No    Hallucinations:Hallucinations: None   Ideas of Reference:None  Suicidal Thoughts:Suicidal Thoughts: No   Homicidal Thoughts:Homicidal Thoughts: No    Sensorium  Memory: Immediate Good; Remote Good  Judgment: Fair  Insight: Shallow   Executive Functions  Concentration: Fair  Attention Span: Fair  Recall: Fair  Fund of Knowledge: Fair  Language: Fair   Psychomotor Activity  Psychomotor Activity:Psychomotor Activity: Normal    Assets  Assets: Desire for Improvement; Social Support   Sleep  Sleep: Sleep: Fair    No data recorded  Physical Exam  Physical Exam Constitutional:      General: She is not in acute distress.    Appearance: She is not ill-appearing, toxic-appearing or diaphoretic.  Eyes:     General: No scleral icterus. Pulmonary:     Effort: Pulmonary effort is normal. No respiratory distress.  Musculoskeletal:        General: Normal range of motion.  Neurological:     Mental Status: She is alert and oriented to person, place, and time.  Psychiatric:        Attention and Perception: Attention and perception normal.  Mood and Affect: Mood and affect normal.        Speech: Speech normal.        Behavior: Behavior normal. Behavior is cooperative.        Thought Content: Thought content normal.    Review of Systems  Constitutional:  Negative for chills and fever.  Respiratory:  Negative for  shortness of breath.   Cardiovascular:  Negative for chest pain and palpitations.  Gastrointestinal:  Positive for abdominal pain.  Genitourinary:  Positive for dysuria.   Blood pressure 116/80, pulse 76, temperature 98.2 F (36.8 C), temperature source Oral, resp. rate 16, SpO2 100 %. There is no height or weight on file to calculate BMI.  Treatment Plan Summary: Plan Pt remains psychiatrically cleared. Disposition is pending placement.  Lorri Frederick, MD 10/05/2022 3:29 PM

## 2022-10-05 NOTE — ED Notes (Signed)
Patient denied SI/HI and AVH. Patient ate breakfast took her medications. Patient has been calm and cooperative. Patient is being monitored for safety.

## 2022-10-06 MED ORDER — NICOTINE 14 MG/24HR TD PT24: 14.0000 mg | MEDICATED_PATCH | Freq: Every day | TRANSDERMAL | Status: DC | PRN

## 2022-10-06 MED ORDER — MENTHOL 3 MG MT LOZG
1.0000 | LOZENGE | Freq: Two times a day (BID) | OROMUCOSAL | Status: DC | PRN
  Administered 2022-10-06: 3 mg via ORAL
  Filled 2022-10-06: qty 9

## 2022-10-06 NOTE — ED Notes (Signed)
Patient  sleeping in no acute stress. RR even and unlabored .Environment secured .Will continue to monitor for safely. 

## 2022-10-06 NOTE — ED Provider Notes (Signed)
Behavioral Health Progress Note  Date and Time: 10/06/2022 10:35 AM Name: Nicole [name] MRN:  [id number]  Subjective:    Haruye Lainez is a 31 y.o. female, with PMH of SCZ unspecified, MDD with psychosis, who presented voluntary to [facility] (07/25/2022) via [facility] after a verbal altercation with [name] (not patient's legal guardian) for transient SI.  This is patient's fourth visit to [facility] for similar concerns this year. Patient has been dismissed from previous group home due to threatening SI and HI, then leaving the group home.   Legal Guardian: [name]  Patient evaluated on the unit, reports she feels tired today. Explains she feels has difficulty sleeping on the unit due to sounds. Endorses adequate appetite. Discussed the findings of her UA, as she continues to endorse dysuria today. Encouraged her to increase her oral intake of fluids as we wait for Ucx to result. Patient also admits to not having underwear here, admits to sharing clothes with another patient in the unit and that the pants she borrows form said patient smells of urine at times. Advised patient against sharing clothes with other patients.  On assessment today, patient denies suicidal ideation. Denies homicidal ideation. She denies auditory or visual hallucinations. There are no apparent paranoid ideations. There are no apparent delusional thought processes.   She denies any side effects to currently prescribed psychiatric medications. She denies any somatic symptoms except as stated above.   Diagnosis:  Final diagnoses:  Severe episode of recurrent major depressive disorder, with psychotic features (HCC)    Total Time spent with patient: 15 minutes  Past Psychiatric History: see HPI Past Medical History:  Past Medical History:  Diagnosis Date   Anxiety    Depression    Hypothyroidism 08/07/2022   Tobacco use disorder  08/07/2022    Past Surgical History:  Procedure Laterality Date   WISDOM TOOTH EXTRACTION Bilateral 2020   Family History:  Family History  Problem Relation Age of Onset   Hypertension Father    Diabetes Father    Family Psychiatric  History: None reported Social History:  Social History   Substance and Sexual Activity  Alcohol Use Never     Social History   Substance and Sexual Activity  Drug Use Never    Social History   Socioeconomic History   Marital status: Single    Spouse name: Not on file   Number of children: Not on file   Years of education: Not on file   Highest education level: Not on file  Occupational History   Not on file  Tobacco Use   Smoking status: Every Day    Types: Cigarettes   Smokeless tobacco: Never  Vaping Use   Vaping Use: Some days  Substance and Sexual Activity   Alcohol use: Never   Drug use: Never   Sexual activity: Yes    Partners: Female    Birth control/protection: Implant  Other Topics Concern   Not on file  Social History Narrative   Not on file   Social Determinants of Health   Financial Resource Strain: Not on file  Food Insecurity: Not on file  Transportation Needs: Not on file  Physical Activity: Not on file  Stress: Not on file  Social Connections: Not on file   SDOH:  SDOH Screenings   Depression (PHQ2-9): Low Risk  (06/10/2022)  Tobacco Use: High Risk (09/30/2022)   Additional Social History:  Pain Medications: SEE MAR Prescriptions: SEE MAR Over the Counter: SEE MAR History of alcohol / drug use?: Yes Longest period of sobriety (when/how long): N/A Negative Consequences of Use:  (NONE) Withdrawal Symptoms: None Name of Substance 1: ETOH IN PAST--PT CURRENTLY DENIES 1 - Frequency: SOCIAL USE ETOH IN PAST 1 - Method of Aquiring: LEGAL 1- Route of Use: ORAL DRINK Name of Substance 2: NICOTINE-VAPE AND CIGARETTES 2 - Amount (size/oz): LESS THAN ONE PACK 2 - Frequency: DAILY 2 - Method of  Aquiring: LEGAL 2 - Route of Substance Use: ORAL SMOKE                Sleep: Good  Appetite:  Good  Current Medications:  Current Facility-Administered Medications  Medication Dose Route Frequency Provider Last Rate Last Admin   acetaminophen (TYLENOL) tablet 650 mg  650 mg Oral Q6H PRN Onuoha, Chinwendu V, NP   650 mg at 09/21/22 2200   ARIPiprazole ER (ABILIFY MAINTENA) injection 400 mg  400 mg Intramuscular Q28 days Princess Bruins, DO   400 mg at 09/27/22 1445   docusate sodium (COLACE) capsule 100 mg  100 mg Oral Daily [name], MD   100 mg at 10/06/22 0924   hydrOXYzine (ATARAX) tablet 25 mg  25 mg Oral TID PRN Onuoha, Chinwendu V, NP   25 mg at 10/05/22 2121   ibuprofen (ADVIL) tablet 400 mg  400 mg Oral Q6H PRN Onuoha, Chinwendu V, NP   400 mg at 09/29/22 1942   levothyroxine (SYNTHROID) tablet 100 mcg  100 mcg Oral Q0600 Princess Bruins, DO   100 mcg at 10/06/22 0630   loratadine (CLARITIN) tablet 10 mg  10 mg Oral Daily Rankin, Shuvon B, NP   10 mg at 10/06/22 1610   metFORMIN (GLUCOPHAGE) tablet 500 mg  500 mg Oral Q breakfast Park Pope, MD   500 mg at 10/06/22 9604   nicotine (NICODERM CQ - dosed in mg/24 hours) patch 14 mg  14 mg Transdermal Daily PRN Carrion-Carrero, Karle Starch, MD       ondansetron (ZOFRAN-ODT) disintegrating tablet 4 mg  4 mg Oral Q8H PRN Sindy Guadeloupe, NP   4 mg at 09/30/22 2336   Oxcarbazepine (TRILEPTAL) tablet 300 mg  300 mg Oral BID Onuoha, Chinwendu V, NP   300 mg at 10/06/22 0924   pantoprazole (PROTONIX) EC tablet 40 mg  40 mg Oral Daily Park Pope, MD   40 mg at 10/06/22 0924   polyethylene glycol (MIRALAX / GLYCOLAX) packet 17 g  17 g Oral Daily PRN Ajibola, Ene A, NP   17 g at 10/04/22 1010   QUEtiapine (SEROQUEL) tablet 400 mg  400 mg Oral BID Onuoha, Chinwendu V, NP   400 mg at 10/06/22 0923   sertraline (ZOLOFT) tablet 150 mg  150 mg Oral Daily Onuoha, Chinwendu V, NP   150 mg at 10/06/22 0923   simethicone (MYLICON) chewable tablet 80  mg  80 mg Oral Q6H PRN Sindy Guadeloupe, NP   80 mg at 09/29/22 2144   traZODone (DESYREL) tablet 100 mg  100 mg Oral QHS Ardis Hughs, NP   100 mg at 10/05/22 2121   valACYclovir (VALTREX) tablet 500 mg  500 mg Oral Daily Princess Bruins, DO   500 mg at 10/06/22 5409   ziprasidone (GEODON) injection 20 mg  20 mg Intramuscular Q12H PRN Onuoha, Chinwendu V, NP   20 mg at 09/19/22 2133   Current Outpatient Medications  Medication Sig Dispense Refill   ABILIFY  MAINTENA 400 MG PRSY prefilled syringe 400 mg every 28 (twenty-eight) days.     cetirizine (ZYRTEC) 10 MG tablet Take 10 mg by mouth daily.     cyclobenzaprine (FLEXERIL) 10 MG tablet Take 1 tablet (10 mg total) by mouth 2 (two) times daily as needed for muscle spasms. 20 tablet 0   fluticasone (FLONASE) 50 MCG/ACT nasal spray Place 1 spray into both nostrils daily.     meloxicam (MOBIC) 15 MG tablet Take 15 mg by mouth daily.     Oxcarbazepine (TRILEPTAL) 300 MG tablet Take 1 tablet (300 mg total) by mouth 2 (two) times daily. 60 tablet 0   QUEtiapine (SEROQUEL) 400 MG tablet Take 1 tablet (400 mg total) by mouth 2 (two) times daily. 60 tablet 0   sertraline (ZOLOFT) 50 MG tablet Take 3 tablets (150 mg total) by mouth in the morning. 90 tablet 0   traZODone (DESYREL) 100 MG tablet Take 1 tablet (100 mg total) by mouth at bedtime. 30 tablet 0   valACYclovir (VALTREX) 500 MG tablet Take 500 mg by mouth daily.      Labs  Lab Results:  Admission on 07/25/2022  Component Date Value Ref Range Status   SARS Coronavirus 2 by RT PCR 07/25/2022 NEGATIVE  NEGATIVE Final   Comment: (NOTE) SARS-CoV-2 target nucleic acids are NOT DETECTED.  The SARS-CoV-2 RNA is generally detectable in upper respiratory specimens during the acute phase of infection. The lowest concentration of SARS-CoV-2 viral copies this assay can detect is 138 copies/mL. A negative result does not preclude SARS-Cov-2 infection and should not be used as the sole basis for  treatment or other patient management decisions. A negative result may occur with  improper specimen collection/handling, submission of specimen other than nasopharyngeal swab, presence of viral mutation(s) within the areas targeted by this assay, and inadequate number of viral copies(<138 copies/mL). A negative result must be combined with clinical observations, patient history, and epidemiological information. The expected result is Negative.  Fact Sheet for Patients:  BloggerCourse.com  Fact Sheet for Healthcare Providers:  SeriousBroker.it  This test is no                          t yet approved or cleared by the [place] FDA and  has been authorized for detection and/or diagnosis of SARS-CoV-2 by FDA under an Emergency Use Authorization (EUA). This EUA will remain  in effect (meaning this test can be used) for the duration of the COVID-19 declaration under Section 564(b)(1) of the Act, 21 U.S.C.section 360bbb-3(b)(1), unless the authorization is terminated  or revoked sooner.       Influenza A by PCR 07/25/2022 NEGATIVE  NEGATIVE Final   Influenza B by PCR 07/25/2022 NEGATIVE  NEGATIVE Final   Comment: (NOTE) The Xpert Xpress SARS-CoV-2/FLU/RSV plus assay is intended as an aid in the diagnosis of influenza from Nasopharyngeal swab specimens and should not be used as a sole basis for treatment. Nasal washings and aspirates are unacceptable for Xpert Xpress SARS-CoV-2/FLU/RSV testing.  Fact Sheet for Patients: BloggerCourse.com  Fact Sheet for Healthcare Providers: SeriousBroker.it  This test is not yet approved or cleared by the [place] FDA and has been authorized for detection and/or diagnosis of SARS-CoV-2 by FDA under an Emergency Use Authorization (EUA). This EUA will remain in effect (meaning this test can be used) for the duration of the COVID-19  declaration under Section 564(b)(1) of the Act, 21 U.S.C. section 360bbb-3(b)(1), unless  the authorization is terminated or revoked.  Performed at [facility], [address] [place], [place] [address]    WBC 07/25/2022 8.3  4.0 - 10.5 K/uL Final   RBC 07/25/2022 4.44  3.87 - 5.11 MIL/uL Final   Hemoglobin 07/25/2022 13.7  12.0 - 15.0 g/dL Final   HCT 62/37/6283 40.2  36.0 - 46.0 % Final   MCV 07/25/2022 90.5  80.0 - 100.0 fL Final   MCH 07/25/2022 30.9  26.0 - 34.0 pg Final   MCHC 07/25/2022 34.1  30.0 - 36.0 g/dL Final   RDW 15/17/6160 12.2  11.5 - 15.5 % Final   Platelets 07/25/2022 248  150 - 400 K/uL Final   nRBC 07/25/2022 0.0  0.0 - 0.2 % Final   Neutrophils Relative % 07/25/2022 43  % Final   Neutro Abs 07/25/2022 3.6  1.7 - 7.7 K/uL Final   Lymphocytes Relative 07/25/2022 52  % Final   Lymphs Abs 07/25/2022 4.2 (H)  0.7 - 4.0 K/uL Final   Monocytes Relative 07/25/2022 5  % Final   Monocytes Absolute 07/25/2022 0.4  0.1 - 1.0 K/uL Final   Eosinophils Relative 07/25/2022 0  % Final   Eosinophils Absolute 07/25/2022 0.0  0.0 - 0.5 K/uL Final   Basophils Relative 07/25/2022 0  % Final   Basophils Absolute 07/25/2022 0.0  0.0 - 0.1 K/uL Final   Immature Granulocytes 07/25/2022 0  % Final   Abs Immature Granulocytes 07/25/2022 0.02  0.00 - 0.07 K/uL Final   Performed at [facility], [address], [place], [place] [address]   Sodium 07/25/2022 138  135 - 145 mmol/L Final   Potassium 07/25/2022 4.0  3.5 - 5.1 mmol/L Final   Chloride 07/25/2022 104  98 - 111 mmol/L Final   CO2 07/25/2022 29  22 - 32 mmol/L Final   Glucose, Bld 07/25/2022 83  70 - 99 mg/dL Final   Glucose reference range applies only to samples taken after fasting for at least 8 hours.   BUN 07/25/2022 11  6 - 20 mg/dL Final   Creatinine, Ser 07/25/2022 0.97  0.44 - 1.00 mg/dL Final   Calcium 62/69/4854 9.2  8.9 - 10.3 mg/dL Final   Total Protein 62/70/3500 7.0  6.5 - 8.1 g/dL Final   Albumin  93/81/8299 3.8  3.5 - 5.0 g/dL Final   AST 37/16/9678 18  15 - 41 U/L Final   ALT 07/25/2022 22  0 - 44 U/L Final   Alkaline Phosphatase 07/25/2022 64  38 - 126 U/L Final   Total Bilirubin 07/25/2022 0.2 (L)  0.3 - 1.2 mg/dL Final   GFR, Estimated 07/25/2022 >60  >60 mL/min Final   Comment: (NOTE) Calculated using the CKD-EPI Creatinine Equation (2021)    Anion gap 07/25/2022 5  5 - 15 Final   Performed at [facility], [address], [place], [place] [address]   POC Amphetamine UR 07/25/2022 None Detected  NONE DETECTED (Cut Off Level 1000 ng/mL) Preliminary   POC Secobarbital (BAR) 07/25/2022 None Detected  NONE DETECTED (Cut Off Level 300 ng/mL) Preliminary   POC Buprenorphine (BUP) 07/25/2022 None Detected  NONE DETECTED (Cut Off Level 10 ng/mL) Preliminary   POC Oxazepam (BZO) 07/25/2022 None Detected  NONE DETECTED (Cut Off Level 300 ng/mL) Preliminary   POC Cocaine UR 07/25/2022 None Detected  NONE DETECTED (Cut Off Level 300 ng/mL) Preliminary   POC Methamphetamine UR 07/25/2022 None Detected  NONE DETECTED (Cut Off Level  1000 ng/mL) Preliminary   POC Morphine 07/25/2022 None Detected  NONE DETECTED (Cut Off Level 300 ng/mL) Preliminary   POC Methadone UR 07/25/2022 None Detected  NONE DETECTED (Cut Off Level 300 ng/mL) Preliminary   POC Oxycodone UR 07/25/2022 None Detected  NONE DETECTED (Cut Off Level 100 ng/mL) Preliminary   POC Marijuana UR 07/25/2022 None Detected  NONE DETECTED (Cut Off Level 50 ng/mL) Preliminary   SARSCOV2ONAVIRUS 2 AG 07/25/2022 NEGATIVE  NEGATIVE Final   Comment: (NOTE) SARS-CoV-2 antigen NOT DETECTED.   Negative results are presumptive.  Negative results do not preclude SARS-CoV-2 infection and should not be used as the sole basis for treatment or other patient management decisions, including infection  control decisions, particularly in the presence of clinical signs and  symptoms consistent with COVID-19, or in those who have been in contact  with the virus.  Negative results must be combined with clinical observations, patient history, and epidemiological information. The expected result is Negative.  Fact Sheet for Patients: https://www.jennings-kim.com/  Fact Sheet for Healthcare Providers: https://alexander-rogers.biz/  This test is not yet approved or cleared by the [place] FDA and  has been authorized for detection and/or diagnosis of SARS-CoV-2 by FDA under an Emergency Use Authorization (EUA).  This EUA will remain in effect (meaning this test can be used) for the duration of  the COV                          ID-19 declaration under Section 564(b)(1) of the Act, 21 U.S.C. section 360bbb-3(b)(1), unless the authorization is terminated or revoked sooner.     Cholesterol 07/25/2022 181  0 - 200 mg/dL Final   Triglycerides 09/81/1914 118  <150 mg/dL Final   HDL 78/29/5621 45  >40 mg/dL Final   Total CHOL/HDL Ratio 07/25/2022 4.0  RATIO Final   VLDL 07/25/2022 24  0 - 40 mg/dL Final   LDL Cholesterol 07/25/2022 112 (H)  0 - 99 mg/dL Final   Comment:        Total Cholesterol/HDL:CHD Risk Coronary Heart Disease Risk Table                     Men   Women  1/2 Average Risk   3.4   3.3  Average Risk       5.0   4.4  2 X Average Risk   9.6   7.1  3 X Average Risk  23.4   11.0        Use the calculated Patient Ratio above and the CHD Risk Table to determine the patient's CHD Risk.        ATP III CLASSIFICATION (LDL):  <100     mg/dL   Optimal  308-657  mg/dL   Near or Above                    Optimal  130-159  mg/dL   Borderline  846-962  mg/dL   High  >952     mg/dL   Very High Performed at [facility], [address], [place], [place] [address]    TSH 07/25/2022 6.668 (H)  0.350 - 4.500 uIU/mL Final   Comment: Performed by a 3rd Generation assay with a functional sensitivity of <=0.01 uIU/mL. Performed at [facility], [address], [place], [place] [address]     Glucose-Capillary 07/26/2022 104 (H)  70 - 99 mg/dL Final   Glucose  reference range applies only to samples taken after fasting for at least 8 hours.   T3, Free 07/31/2022 2.3  2.0 - 4.4 pg/mL Final   Comment: (NOTE) Performed At: [facility] [address] [place], [place] [id number] Jolene Schimke MD WU:[id number]    Free T4 07/31/2022 0.60 (L)  0.61 - 1.12 ng/dL Final   Comment: (NOTE) Biotin ingestion may interfere with free T4 tests. If the results are inconsistent with the TSH level, previous test results, or the clinical presentation, then consider biotin interference. If needed, order repeat testing after stopping biotin. Performed at [facility], [address] [place], [place] [address]    Glucose-Capillary 08/29/2022 100 (H)  70 - 99 mg/dL Final   Glucose reference range applies only to samples taken after fasting for at least 8 hours.   TSH 09/05/2022 0.793  0.350 - 4.500 uIU/mL Final   Comment: Performed by a 3rd Generation assay with a functional sensitivity of <=0.01 uIU/mL. Performed at [facility], [address] [place], [place] [address]    Free T4 09/05/2022 0.73  0.61 - 1.12 ng/dL Final   Comment: (NOTE) Biotin ingestion may interfere with free T4 tests. If the results are inconsistent with the TSH level, previous test results, or the clinical presentation, then consider biotin interference. If needed, order repeat testing after stopping biotin. Performed at [facility], [address], [place], [place] [address]    Preg Test, Ur 09/06/2022 Negative  Negative Final   Preg Test, Ur 09/06/2022 NEGATIVE  NEGATIVE Final   Comment:        THE SENSITIVITY OF THIS METHODOLOGY IS >24 mIU/mL    Preg Test, Ur 09/05/2022 NEGATIVE  NEGATIVE Final   Comment:        THE SENSITIVITY OF THIS METHODOLOGY IS >24 mIU/mL    Sodium 09/13/2022 136  135 - 145 mmol/L Final   Potassium 09/13/2022 4.5  3.5 - 5.1 mmol/L Final   Chloride  09/13/2022 105  98 - 111 mmol/L Final   CO2 09/13/2022 21 (L)  22 - 32 mmol/L Final   Glucose, Bld 09/13/2022 111 (H)  70 - 99 mg/dL Final   Glucose reference range applies only to samples taken after fasting for at least 8 hours.   BUN 09/13/2022 14  6 - 20 mg/dL Final   Creatinine, Ser 09/13/2022 0.92  0.44 - 1.00 mg/dL Final   Calcium 62/95/2841 9.1  8.9 - 10.3 mg/dL Final   GFR, Estimated 09/13/2022 >60  >60 mL/min Final   Comment: (NOTE) Calculated using the CKD-EPI Creatinine Equation (2021)    Anion gap 09/13/2022 10  5 - 15 Final   Performed at [facility], [address] [place], [place] [address]   Vitamin B-12 09/13/2022 585  180 - 914 pg/mL Final   Comment: (NOTE) This assay is not validated for testing neonatal or myeloproliferative syndrome specimens for Vitamin B12 levels. Performed at [facility], [address], [place], [place] [address]    Color, Urine 09/14/2022 YELLOW  YELLOW Final   APPearance 09/14/2022 HAZY (A)  CLEAR Final   Specific Gravity, Urine 09/14/2022 1.025  1.005 - 1.030 Final   pH 09/14/2022 5.0  5.0 - 8.0 Final   Glucose, UA 09/14/2022 NEGATIVE  NEGATIVE mg/dL Final   Hgb urine dipstick 09/14/2022 NEGATIVE  NEGATIVE Final   Bilirubin Urine 09/14/2022 NEGATIVE  NEGATIVE Final   Ketones, ur 09/14/2022 NEGATIVE  NEGATIVE mg/dL Final  Protein, ur 09/14/2022 NEGATIVE  NEGATIVE mg/dL Final   Nitrite 16/07/9603 NEGATIVE  NEGATIVE Final   Leukocytes,Ua 09/14/2022 TRACE (A)  NEGATIVE Final   RBC / HPF 09/14/2022 0-5  0 - 5 RBC/hpf Final   WBC, UA 09/14/2022 0-5  0 - 5 WBC/hpf Final   Bacteria, UA 09/14/2022 RARE (A)  NONE SEEN Final   Squamous Epithelial / LPF 09/14/2022 0-5  0 - 5 Final   Mucus 09/14/2022 PRESENT   Final   Performed at [facility], [address], [place], [place] [address]   Glucose-Capillary 09/16/2022 105 (H)  70 - 99 mg/dL Final   Glucose reference range applies only to samples taken after fasting for at  least 8 hours.   SARS Coronavirus 2 by RT PCR 09/30/2022 NEGATIVE  NEGATIVE Final   Comment: (NOTE) SARS-CoV-2 target nucleic acids are NOT DETECTED.  The SARS-CoV-2 RNA is generally detectable in upper respiratory specimens during the acute phase of infection. The lowest concentration of SARS-CoV-2 viral copies this assay can detect is 138 copies/mL. A negative result does not preclude SARS-Cov-2 infection and should not be used as the sole basis for treatment or other patient management decisions. A negative result may occur with  improper specimen collection/handling, submission of specimen other than nasopharyngeal swab, presence of viral mutation(s) within the areas targeted by this assay, and inadequate number of viral copies(<138 copies/mL). A negative result must be combined with clinical observations, patient history, and epidemiological information. The expected result is Negative.  Fact Sheet for Patients:  BloggerCourse.com  Fact Sheet for Healthcare Providers:  SeriousBroker.it  This test is no                          t yet approved or cleared by the [place] FDA and  has been authorized for detection and/or diagnosis of SARS-CoV-2 by FDA under an Emergency Use Authorization (EUA). This EUA will remain  in effect (meaning this test can be used) for the duration of the COVID-19 declaration under Section 564(b)(1) of the Act, 21 U.S.C.section 360bbb-3(b)(1), unless the authorization is terminated  or revoked sooner.       Influenza A by PCR 09/30/2022 NEGATIVE  NEGATIVE Final   Influenza B by PCR 09/30/2022 NEGATIVE  NEGATIVE Final   Comment: (NOTE) The Xpert Xpress SARS-CoV-2/FLU/RSV plus assay is intended as an aid in the diagnosis of influenza from Nasopharyngeal swab specimens and should not be used as a sole basis for treatment. Nasal washings and aspirates are unacceptable for Xpert Xpress  SARS-CoV-2/FLU/RSV testing.  Fact Sheet for Patients: BloggerCourse.com  Fact Sheet for Healthcare Providers: SeriousBroker.it  This test is not yet approved or cleared by the [place] FDA and has been authorized for detection and/or diagnosis of SARS-CoV-2 by FDA under an Emergency Use Authorization (EUA). This EUA will remain in effect (meaning this test can be used) for the duration of the COVID-19 declaration under Section 564(b)(1) of the Act, 21 U.S.C. section 360bbb-3(b)(1), unless the authorization is terminated or revoked.     Resp Syncytial Virus by PCR 09/30/2022 NEGATIVE  NEGATIVE Final   Comment: (NOTE) Fact Sheet for Patients: BloggerCourse.com  Fact Sheet for Healthcare Providers: SeriousBroker.it  This test is not yet approved or cleared by the [place] FDA and has been authorized for detection and/or diagnosis of SARS-CoV-2 by FDA under an Emergency Use Authorization (EUA). This EUA will remain in effect (meaning this test can be used)  for the duration of the COVID-19 declaration under Section 564(b)(1) of the Act, 21 U.S.C. section 360bbb-3(b)(1), unless the authorization is terminated or revoked.  Performed at [facility], [address], [place], [place] [address]    Sodium 10/01/2022 136  135 - 145 mmol/L Final   Potassium 10/01/2022 3.8  3.5 - 5.1 mmol/L Final   Chloride 10/01/2022 104  98 - 111 mmol/L Final   CO2 10/01/2022 27  22 - 32 mmol/L Final   Glucose, Bld 10/01/2022 98  70 - 99 mg/dL Final   Glucose reference range applies only to samples taken after fasting for at least 8 hours.   BUN 10/01/2022 12  6 - 20 mg/dL Final   Creatinine, Ser 10/01/2022 0.98  0.44 - 1.00 mg/dL Final   Calcium 15/83/0940 8.9  8.9 - 10.3 mg/dL Final   GFR, Estimated 10/01/2022 >60  >60 mL/min Final   Comment: (NOTE) Calculated using the CKD-EPI  Creatinine Equation (2021)    Anion gap 10/01/2022 5  5 - 15 Final   Performed at [facility], [address], [place], [place] [address]   WBC 10/01/2022 5.1  4.0 - 10.5 K/uL Final   RBC 10/01/2022 4.31  3.87 - 5.11 MIL/uL Final   Hemoglobin 10/01/2022 12.7  12.0 - 15.0 g/dL Final   HCT 81/07/3158 38.8  36.0 - 46.0 % Final   MCV 10/01/2022 90.0  80.0 - 100.0 fL Final   MCH 10/01/2022 29.5  26.0 - 34.0 pg Final   MCHC 10/01/2022 32.7  30.0 - 36.0 g/dL Final   RDW 45/85/9292 12.4  11.5 - 15.5 % Final   Platelets 10/01/2022 300  150 - 400 K/uL Final   nRBC 10/01/2022 0.0  0.0 - 0.2 % Final   Performed at [facility], [address], [place], [place] [address]   Lipase 10/01/2022 29  11 - 51 U/L Final   Performed at [facility], [address], [place], [place] [address]   Color, Urine 10/05/2022 YELLOW  YELLOW Final   APPearance 10/05/2022 HAZY (A)  CLEAR Final   Specific Gravity, Urine 10/05/2022 1.018  1.005 - 1.030 Final   pH 10/05/2022 5.0  5.0 - 8.0 Final   Glucose, UA 10/05/2022 NEGATIVE  NEGATIVE mg/dL Final   Hgb urine dipstick 10/05/2022 NEGATIVE  NEGATIVE Final   Bilirubin Urine 10/05/2022 NEGATIVE  NEGATIVE Final   Ketones, ur 10/05/2022 NEGATIVE  NEGATIVE mg/dL Final   Protein, ur 71/16/5790 NEGATIVE  NEGATIVE mg/dL Final   Nitrite 38/33/3832 NEGATIVE  NEGATIVE Final   Leukocytes,Ua 10/05/2022 TRACE (A)  NEGATIVE Final   RBC / HPF 10/05/2022 0-5  0 - 5 RBC/hpf Final   WBC, UA 10/05/2022 0-5  0 - 5 WBC/hpf Final   Bacteria, UA 10/05/2022 RARE (A)  NONE SEEN Final   Squamous Epithelial / LPF 10/05/2022 0-5  0 - 5 Final   Mucus 10/05/2022 PRESENT   Final   Performed at [facility], [address] [place], [place] [address]  Admission on 07/04/2022, Discharged on 07/05/2022  Component Date Value Ref Range Status   Sodium 07/04/2022 142  135 - 145 mmol/L Final   Potassium 07/04/2022 4.4  3.5 - 5.1 mmol/L Final   Chloride 07/04/2022 109  98 - 111  mmol/L Final   CO2 07/04/2022 26  22 - 32 mmol/L Final   Glucose, Bld 07/04/2022 96  70 - 99 mg/dL Final   Glucose reference range  applies only to samples taken after fasting for at least 8 hours.   BUN 07/04/2022 15  6 - 20 mg/dL Final   Creatinine, Ser 07/04/2022 0.83  0.44 - 1.00 mg/dL Final   Calcium 16/07/9603 9.3  8.9 - 10.3 mg/dL Final   Total Protein 54/06/8118 7.2  6.5 - 8.1 g/dL Final   Albumin 14/78/2956 3.9  3.5 - 5.0 g/dL Final   AST 21/30/8657 23  15 - 41 U/L Final   ALT 07/04/2022 28  0 - 44 U/L Final   Alkaline Phosphatase 07/04/2022 77  38 - 126 U/L Final   Total Bilirubin 07/04/2022 0.4  0.3 - 1.2 mg/dL Final   GFR, Estimated 07/04/2022 >60  >60 mL/min Final   Comment: (NOTE) Calculated using the CKD-EPI Creatinine Equation (2021)    Anion gap 07/04/2022 7  5 - 15 Final   Performed at [facility], [address], [place], [place] [address]   Alcohol, Ethyl (B) 07/04/2022 <10  <10 mg/dL Final   Comment: (NOTE) Lowest detectable limit for serum alcohol is 10 mg/dL.  For medical purposes only. Performed at [facility], [address] [place], [place] [address]    WBC 07/04/2022 7.0  4.0 - 10.5 K/uL Final   RBC 07/04/2022 4.18  3.87 - 5.11 MIL/uL Final   Hemoglobin 07/04/2022 12.8  12.0 - 15.0 g/dL Final   HCT 41/32/4401 39.1  36.0 - 46.0 % Final   MCV 07/04/2022 93.5  80.0 - 100.0 fL Final   MCH 07/04/2022 30.6  26.0 - 34.0 pg Final   MCHC 07/04/2022 32.7  30.0 - 36.0 g/dL Final   RDW 02/72/5366 12.9  11.5 - 15.5 % Final   Platelets 07/04/2022 243  150 - 400 K/uL Final   nRBC 07/04/2022 0.0  0.0 - 0.2 % Final   Neutrophils Relative % 07/04/2022 43  % Final   Neutro Abs 07/04/2022 3.0  1.7 - 7.7 K/uL Final   Lymphocytes Relative 07/04/2022 50  % Final   Lymphs Abs 07/04/2022 3.5  0.7 - 4.0 K/uL Final   Monocytes Relative 07/04/2022 7  % Final   Monocytes Absolute 07/04/2022 0.5  0.1 - 1.0 K/uL Final   Eosinophils  Relative 07/04/2022 0  % Final   Eosinophils Absolute 07/04/2022 0.0  0.0 - 0.5 K/uL Final   Basophils Relative 07/04/2022 0  % Final   Basophils Absolute 07/04/2022 0.0  0.0 - 0.1 K/uL Final   Immature Granulocytes 07/04/2022 0  % Final   Abs Immature Granulocytes 07/04/2022 0.01  0.00 - 0.07 K/uL Final   Performed at [facility], [address], [place], [place] [address]   I-stat hCG, quantitative 07/04/2022 <5.0  <5 mIU/mL Final   Comment 3 07/04/2022          Final   Comment:   GEST. AGE      CONC.  (mIU/mL)   <=1 WEEK        5 - 50     2 WEEKS       50 - 500     3 WEEKS       100 - 10,000     4 WEEKS     1,000 - 30,000        FEMALE AND NON-PREGNANT FEMALE:     LESS THAN 5 mIU/mL   Admission on 06/14/2022, Discharged on 06/14/2022  Component Date Value Ref Range Status   Color, UA 06/14/2022 yellow  yellow  Final   Clarity, UA 06/14/2022 cloudy (A)  clear Final   Glucose, UA 06/14/2022 negative  negative mg/dL Final   Bilirubin, UA 47/82/956209/10/2021 negative  negative Final   Ketones, POC UA 06/14/2022 negative  negative mg/dL Final   Spec Grav, UA 13/08/657809/10/2021 1.025  1.010 - 1.025 Final   Blood, UA 06/14/2022 negative  negative Final   pH, UA 06/14/2022 7.5  5.0 - 8.0 Final   Protein Ur, POC 06/14/2022 =30 (A)  negative mg/dL Final   Urobilinogen, UA 06/14/2022 0.2  0.2 or 1.0 E.U./dL Final   Nitrite, UA 46/96/295209/10/2021 Negative  Negative Final   Leukocytes, UA 06/14/2022 Negative  Negative Final   Preg Test, Ur 06/14/2022 Negative  Negative Final   Specimen Description 06/14/2022 URINE, CLEAN CATCH   Final   Special Requests 06/14/2022    Final                   Value:NONE Performed at [facility], [address] [place], [place] [id number]    Culture 06/14/2022 MULTIPLE SPECIES PRESENT, SUGGEST RECOLLECTION (A)   Final   Report Status 06/14/2022 06/16/2022 FINAL   Final  Admission on 06/07/2022, Discharged on 06/10/2022  Component Date Value Ref Range  Status   SARS Coronavirus 2 by RT PCR 06/07/2022 NEGATIVE  NEGATIVE Final   Comment: (NOTE) SARS-CoV-2 target nucleic acids are NOT DETECTED.  The SARS-CoV-2 RNA is generally detectable in upper respiratory specimens during the acute phase of infection. The lowest concentration of SARS-CoV-2 viral copies this assay can detect is 138 copies/mL. A negative result does not preclude SARS-Cov-2 infection and should not be used as the sole basis for treatment or other patient management decisions. A negative result may occur with  improper specimen collection/handling, submission of specimen other than nasopharyngeal swab, presence of viral mutation(s) within the areas targeted by this assay, and inadequate number of viral copies(<138 copies/mL). A negative result must be combined with clinical observations, patient history, and epidemiological information. The expected result is Negative.  Fact Sheet for Patients:  BloggerCourse.comhttps://www.fda.gov/media/152166/download  Fact Sheet for Healthcare Providers:  SeriousBroker.ithttps://www.fda.gov/media/152162/download  This test is no                          t yet approved or cleared by the [place] FDA and  has been authorized for detection and/or diagnosis of SARS-CoV-2 by FDA under an Emergency Use Authorization (EUA). This EUA will remain  in effect (meaning this test can be used) for the duration of the COVID-19 declaration under Section 564(b)(1) of the Act, 21 U.S.C.section 360bbb-3(b)(1), unless the authorization is terminated  or revoked sooner.       Influenza A by PCR 06/07/2022 NEGATIVE  NEGATIVE Final   Influenza B by PCR 06/07/2022 NEGATIVE  NEGATIVE Final   Comment: (NOTE) The Xpert Xpress SARS-CoV-2/FLU/RSV plus assay is intended as an aid in the diagnosis of influenza from Nasopharyngeal swab specimens and should not be used as a sole basis for treatment. Nasal washings and aspirates are unacceptable for Xpert Xpress  SARS-CoV-2/FLU/RSV testing.  Fact Sheet for Patients: BloggerCourse.comhttps://www.fda.gov/media/152166/download  Fact Sheet for Healthcare Providers: SeriousBroker.ithttps://www.fda.gov/media/152162/download  This test is not yet approved or cleared by the [place] FDA and has been authorized for detection and/or diagnosis of SARS-CoV-2 by FDA under an Emergency Use Authorization (EUA). This EUA will remain in effect (meaning this test can be used) for the duration of the COVID-19 declaration under Section  564(b)(1) of the Act, 21 U.S.C. section 360bbb-3(b)(1), unless the authorization is terminated or revoked.  Performed at [facility], [address], [place], [place] [address]    WBC 06/07/2022 5.7  4.0 - 10.5 K/uL Final   RBC 06/07/2022 4.39  3.87 - 5.11 MIL/uL Final   Hemoglobin 06/07/2022 13.2  12.0 - 15.0 g/dL Final   HCT 47/82/9562 40.4  36.0 - 46.0 % Final   MCV 06/07/2022 92.0  80.0 - 100.0 fL Final   MCH 06/07/2022 30.1  26.0 - 34.0 pg Final   MCHC 06/07/2022 32.7  30.0 - 36.0 g/dL Final   RDW 13/05/6577 12.4  11.5 - 15.5 % Final   Platelets 06/07/2022 308  150 - 400 K/uL Final   nRBC 06/07/2022 0.0  0.0 - 0.2 % Final   Neutrophils Relative % 06/07/2022 42  % Final   Neutro Abs 06/07/2022 2.4  1.7 - 7.7 K/uL Final   Lymphocytes Relative 06/07/2022 54  % Final   Lymphs Abs 06/07/2022 3.1  0.7 - 4.0 K/uL Final   Monocytes Relative 06/07/2022 4  % Final   Monocytes Absolute 06/07/2022 0.2  0.1 - 1.0 K/uL Final   Eosinophils Relative 06/07/2022 0  % Final   Eosinophils Absolute 06/07/2022 0.0  0.0 - 0.5 K/uL Final   Basophils Relative 06/07/2022 0  % Final   Basophils Absolute 06/07/2022 0.0  0.0 - 0.1 K/uL Final   Immature Granulocytes 06/07/2022 0  % Final   Abs Immature Granulocytes 06/07/2022 0.01  0.00 - 0.07 K/uL Final   Performed at [facility], [address] [place], [place] [address]   Sodium 06/07/2022 139  135 - 145 mmol/L Final   Potassium 06/07/2022 4.0  3.5 - 5.1  mmol/L Final   Chloride 06/07/2022 104  98 - 111 mmol/L Final   CO2 06/07/2022 28  22 - 32 mmol/L Final   Glucose, Bld 06/07/2022 104 (H)  70 - 99 mg/dL Final   Glucose reference range applies only to samples taken after fasting for at least 8 hours.   BUN 06/07/2022 8  6 - 20 mg/dL Final   Creatinine, Ser 06/07/2022 0.84  0.44 - 1.00 mg/dL Final   Calcium 95/28/4132 9.1  8.9 - 10.3 mg/dL Final   Total Protein 44/10/270 6.9  6.5 - 8.1 g/dL Final   Albumin 53/66/4403 3.7  3.5 - 5.0 g/dL Final   AST 47/42/5956 19  15 - 41 U/L Final   ALT 06/07/2022 22  0 - 44 U/L Final   Alkaline Phosphatase 06/07/2022 54  38 - 126 U/L Final   Total Bilirubin 06/07/2022 0.4  0.3 - 1.2 mg/dL Final   GFR, Estimated 06/07/2022 >60  >60 mL/min Final   Comment: (NOTE) Calculated using the CKD-EPI Creatinine Equation (2021)    Anion gap 06/07/2022 7  5 - 15 Final   Performed at [facility], [address], [place], [place] [address]   Hgb A1c MFr Bld 06/07/2022 5.0  4.8 - 5.6 % Final   Comment: (NOTE) Pre diabetes:          5.7%-6.4%  Diabetes:              >6.4%  Glycemic control for   <7.0% adults with diabetes    Mean Plasma Glucose 06/07/2022 96.8  mg/dL Final   Performed at [facility], [address], [place], [place] [address]   TSH 06/07/2022 1.620  0.350 - 4.500  uIU/mL Final   Comment: Performed by a 3rd Generation assay with a functional sensitivity of <=0.01 uIU/mL. Performed at [facility], [address], [place], [place] [address]    RPR Ser Ql 06/07/2022 NON REACTIVE  NON REACTIVE Final   Performed at [facility], [address] [place], [place] [address]   Color, Urine 06/07/2022 YELLOW  YELLOW Final   APPearance 06/07/2022 HAZY (A)  CLEAR Final   Specific Gravity, Urine 06/07/2022 1.018  1.005 - 1.030 Final   pH 06/07/2022 7.0  5.0 - 8.0 Final   Glucose, UA 06/07/2022 NEGATIVE  NEGATIVE mg/dL Final   Hgb urine dipstick 06/07/2022 NEGATIVE  NEGATIVE  Final   Bilirubin Urine 06/07/2022 NEGATIVE  NEGATIVE Final   Ketones, ur 06/07/2022 NEGATIVE  NEGATIVE mg/dL Final   Protein, ur 56/21/3086 NEGATIVE  NEGATIVE mg/dL Final   Nitrite 57/84/6962 NEGATIVE  NEGATIVE Final   Leukocytes,Ua 06/07/2022 NEGATIVE  NEGATIVE Final   Performed at [facility], [address], [place], [place] [address]   Cholesterol 06/07/2022 164  0 - 200 mg/dL Final   Triglycerides 13/24/4010 158 (H)  <150 mg/dL Final   HDL 27/25/3664 44  >40 mg/dL Final   Total CHOL/HDL Ratio 06/07/2022 3.7  RATIO Final   VLDL 06/07/2022 32  0 - 40 mg/dL Final   LDL Cholesterol 06/07/2022 88  0 - 99 mg/dL Final   Comment:        Total Cholesterol/HDL:CHD Risk Coronary Heart Disease Risk Table                     Men   Women  1/2 Average Risk   3.4   3.3  Average Risk       5.0   4.4  2 X Average Risk   9.6   7.1  3 X Average Risk  23.4   11.0        Use the calculated Patient Ratio above and the CHD Risk Table to determine the patient's CHD Risk.        ATP III CLASSIFICATION (LDL):  <100     mg/dL   Optimal  403-474  mg/dL   Near or Above                    Optimal  130-159  mg/dL   Borderline  259-563  mg/dL   High  >875     mg/dL   Very High Performed at [facility], [address], [place], [place] [address]    HIV Screen 4th Generation wRfx 06/07/2022 Non Reactive  Non Reactive Final   Performed at [facility], [address], [place], [place] [address]   SARSCOV2ONAVIRUS 2 AG 06/07/2022 NEGATIVE  NEGATIVE Final   Comment: (NOTE) SARS-CoV-2 antigen NOT DETECTED.   Negative results are presumptive.  Negative results do not preclude SARS-CoV-2 infection and should not be used as the sole basis for treatment or other patient management decisions, including infection  control decisions, particularly in the presence of clinical signs and  symptoms consistent with COVID-19, or in those who have been in contact with the virus.  Negative results  must be combined with clinical observations, patient history, and epidemiological information. The expected result is Negative.  Fact Sheet for Patients: https://www.jennings-kim.com/  Fact Sheet for Healthcare Providers: https://alexander-rogers.biz/  This test is not yet approved or cleared by the [place] and  has been authorized  for detection and/or diagnosis of SARS-CoV-2 by FDA under an Emergency Use Authorization (EUA).  This EUA will remain in effect (meaning this test can be used) for the duration of  the COV                          ID-19 declaration under Section 564(b)(1) of the Act, 21 U.S.C. section 360bbb-3(b)(1), unless the authorization is terminated or revoked sooner.     POC Amphetamine UR 06/07/2022 None Detected  NONE DETECTED (Cut Off Level 1000 ng/mL) Final   POC Secobarbital (BAR) 06/07/2022 None Detected  NONE DETECTED (Cut Off Level 300 ng/mL) Final   POC Buprenorphine (BUP) 06/07/2022 None Detected  NONE DETECTED (Cut Off Level 10 ng/mL) Final   POC Oxazepam (BZO) 06/07/2022 None Detected  NONE DETECTED (Cut Off Level 300 ng/mL) Final   POC Cocaine UR 06/07/2022 None Detected  NONE DETECTED (Cut Off Level 300 ng/mL) Final   POC Methamphetamine UR 06/07/2022 None Detected  NONE DETECTED (Cut Off Level 1000 ng/mL) Final   POC Morphine 06/07/2022 None Detected  NONE DETECTED (Cut Off Level 300 ng/mL) Final   POC Methadone UR 06/07/2022 None Detected  NONE DETECTED (Cut Off Level 300 ng/mL) Final   POC Oxycodone UR 06/07/2022 None Detected  NONE DETECTED (Cut Off Level 100 ng/mL) Final   POC Marijuana UR 06/07/2022 None Detected  NONE DETECTED (Cut Off Level 50 ng/mL) Final   Preg Test, Ur 06/08/2022 NEGATIVE  NEGATIVE Final   Comment:        THE SENSITIVITY OF THIS METHODOLOGY IS >20 mIU/mL. Performed at [facility], [address] [place], [place] [address]    Preg Test, Ur 06/08/2022 NEGATIVE  NEGATIVE Final    Comment:        THE SENSITIVITY OF THIS METHODOLOGY IS >24 mIU/mL     Blood Alcohol level:  Lab Results  Component Value Date   ETH <10 07/04/2022   ETH <10 02/07/2022    Metabolic Disorder Labs: Lab Results  Component Value Date   HGBA1C 5.0 06/07/2022   MPG 96.8 06/07/2022   MPG 96.8 02/07/2022   No results found for: "PROLACTIN" Lab Results  Component Value Date   CHOL 181 07/25/2022   TRIG 118 07/25/2022   HDL 45 07/25/2022   CHOLHDL 4.0 07/25/2022   VLDL 24 07/25/2022   LDLCALC 112 (H) 07/25/2022   LDLCALC 88 06/07/2022    Therapeutic Lab Levels: No results found for: "LITHIUM" No results found for: "VALPROATE" No results found for: "CBMZ"  Physical Findings   GAD-7    Flowsheet Row [facility]17/20[facility]  Total GAD-7 Score 17      PHQ2-9    Flowsheet Row ED from 06/07/2022 in [facility] Office Visit from 12/17/2021 in [facility]  PHQ-2 Total Score 1 2  PHQ-9 Total Score 2 8      Flowsheet Row ED from 07/25/2022 in [facility] ED from 07/04/2022 in [facility] from 06/14/2022 in [facility]   C-SSRS RISK CATEGORY No Risk No Risk No Risk        Musculoskeletal  Strength & Muscle Tone: within normal limits Gait & Station: normal Patient leans: N/A  Psychiatric Specialty Exam  Presentation  General Appearance:  Appropriate for Environment; Fairly Groomed  Eye Contact:  Good  Speech: Normal Rate  Speech Volume: Normal  Handedness: Right   Mood and Affect  Mood: Euthymic  Affect: Appropriate; Congruent   Thought Process  Thought Processes: Coherent; Goal Directed; Linear  Descriptions of Associations:Intact  Orientation:Full (Time, Place and Person)  Thought Content:Logical  Diagnosis of Schizophrenia or Schizoaffective disorder  in past: No    Hallucinations:Hallucinations: None   Ideas of Reference:None  Suicidal Thoughts:Suicidal Thoughts: No   Homicidal Thoughts:Homicidal Thoughts: No    Sensorium  Memory: Immediate Good; Recent Good; Remote Good  Judgment: Fair  Insight: Fair   Chartered certified accountant: Fair  Attention Span: Fair  Recall: [employer] of Knowledge: Fair  Language: Fair   Psychomotor Activity  Psychomotor Activity:Psychomotor Activity: Normal    Assets  Assets: Manufacturing systems engineer; Social Support   Sleep  Sleep: Sleep: Fair    No data recorded  Physical Exam  Physical Exam Constitutional:      General: She is not in acute distress.    Appearance: She is not ill-appearing, toxic-appearing or diaphoretic.  Eyes:     General: No scleral icterus. Pulmonary:     Effort: Pulmonary effort is normal. No respiratory distress.  Musculoskeletal:        General: Normal range of motion.  Skin:    General: Skin is warm and dry.  Neurological:     Mental Status: She is alert and oriented to person, place, and time.  Psychiatric:        Attention and Perception: Attention and perception normal.        Mood and Affect: Mood and affect normal.        Speech: Speech normal.        Behavior: Behavior normal. Behavior is cooperative.        Thought Content: Thought content normal.    Review of Systems  Constitutional:  Negative for chills and fever.  Respiratory:  Negative for shortness of breath.   Cardiovascular:  Negative for chest pain and palpitations.  Gastrointestinal:  Negative for abdominal pain.  Genitourinary:  Positive for dysuria. Negative for flank pain.   Blood pressure 105/64, pulse 93, temperature 98.2 F (36.8 C), temperature source Oral, resp. rate 18, SpO2 95 %. There is no height or weight on file to calculate BMI.  Treatment Plan Summary: Plan Pt remains psychiatrically cleared. Disposition is pending  placement.  Lorri Frederick, MD 10/06/2022 10:35 AM

## 2022-10-06 NOTE — ED Notes (Signed)
Pt sleeping@this time. Breathing even and unlabored. Will continue to monitor for safety 

## 2022-10-06 NOTE — ED Notes (Signed)
Patient refused nicotine patch.Provider is aware.

## 2022-10-06 NOTE — ED Notes (Signed)
Patient alert and oriented x 3. Denies SI/HI/AVH. Denies intent or plan to harm self or others. Routine conducted according to faculty protocol. Encourage patient to notify staff with any needs or concerns. Patient verbalized agreement and understanding. Will continue to monitor for safety. 

## 2022-10-06 NOTE — ED Notes (Signed)
Rn called lab to notify them that a urine culture was put in on patients urine.

## 2022-10-06 NOTE — ED Notes (Signed)
Pt calm and cooperative.alert and orient x 4. Will continue to monitor for safety

## 2022-10-06 NOTE — ED Notes (Signed)
Patient states that her throat is hurting. Notified provider. Provider states to drink warm tea. Provider also ordered lozenges if needed. Will continue to monitor for safety.

## 2022-10-06 NOTE — ED Notes (Signed)
Patient states that her throat is still hurting after hot tea and request a throat lozenge

## 2022-10-06 NOTE — ED Notes (Addendum)
Patient went outside with Rn. Will continue to monitor for safety.

## 2022-10-06 NOTE — ED Notes (Addendum)
Rodney Booze is in assessment room with provider

## 2022-10-06 NOTE — ED Notes (Signed)
Nicole [name] returned to unit.

## 2022-10-07 ENCOUNTER — Ambulatory Visit (INDEPENDENT_AMBULATORY_CARE_PROVIDER_SITE_OTHER)
Admission: EM | Admit: 2022-10-07 | Discharge: 2022-11-23 | Disposition: A | Discharge status: Home / Self Care | Payer: Medicare HMO | Source: Home / Self Care | Attending: Family | Admitting: Family

## 2022-10-07 ENCOUNTER — Encounter (HOSPITAL_COMMUNITY): Payer: Self-pay

## 2022-10-07 ENCOUNTER — Other Ambulatory Visit: Payer: Self-pay

## 2022-10-07 ENCOUNTER — Emergency Department (HOSPITAL_COMMUNITY)
Admission: EM | Admit: 2022-10-07 | Discharge: 2022-10-07 | Disposition: A | Discharge status: Other Acute Inpatient Hospital | Payer: Medicare HMO | Attending: Emergency Medicine | Admitting: Emergency Medicine

## 2022-10-07 DIAGNOSIS — E039 Hypothyroidism, unspecified: Secondary | ICD-10-CM | POA: Insufficient documentation

## 2022-10-07 DIAGNOSIS — N390 Urinary tract infection, site not specified: Secondary | ICD-10-CM | POA: Diagnosis not present

## 2022-10-07 DIAGNOSIS — F331 Major depressive disorder, recurrent, moderate: Secondary | ICD-10-CM | POA: Insufficient documentation

## 2022-10-07 DIAGNOSIS — Z5901 Sheltered homelessness: Secondary | ICD-10-CM | POA: Insufficient documentation

## 2022-10-07 DIAGNOSIS — R4183 Borderline intellectual functioning: Secondary | ICD-10-CM

## 2022-10-07 DIAGNOSIS — F209 Schizophrenia, unspecified: Secondary | ICD-10-CM | POA: Insufficient documentation

## 2022-10-07 DIAGNOSIS — F17200 Nicotine dependence, unspecified, uncomplicated: Secondary | ICD-10-CM

## 2022-10-07 DIAGNOSIS — L304 Erythema intertrigo: Secondary | ICD-10-CM | POA: Diagnosis not present

## 2022-10-07 DIAGNOSIS — J019 Acute sinusitis, unspecified: Secondary | ICD-10-CM | POA: Diagnosis not present

## 2022-10-07 DIAGNOSIS — Z20822 Contact with and (suspected) exposure to covid-19: Secondary | ICD-10-CM | POA: Insufficient documentation

## 2022-10-07 DIAGNOSIS — N9489 Other specified conditions associated with female genital organs and menstrual cycle: Secondary | ICD-10-CM | POA: Insufficient documentation

## 2022-10-07 DIAGNOSIS — Z1152 Encounter for screening for COVID-19: Secondary | ICD-10-CM | POA: Insufficient documentation

## 2022-10-07 DIAGNOSIS — E118 Type 2 diabetes mellitus with unspecified complications: Secondary | ICD-10-CM | POA: Insufficient documentation

## 2022-10-07 DIAGNOSIS — F419 Anxiety disorder, unspecified: Secondary | ICD-10-CM | POA: Insufficient documentation

## 2022-10-07 LAB — CBC
HCT: 40 % (ref 36.0–46.0)
Hemoglobin: 12.9 g/dL (ref 12.0–15.0)
MCH: 29.6 pg (ref 26.0–34.0)
MCHC: 32.3 g/dL (ref 30.0–36.0)
MCV: 91.7 fL (ref 80.0–100.0)
Platelets: 295 10*3/uL (ref 150–400)
RBC: 4.36 MIL/uL (ref 3.87–5.11)
RDW: 12.4 % (ref 11.5–15.5)
WBC: 5.8 10*3/uL (ref 4.0–10.5)
nRBC: 0 % (ref 0.0–0.2)

## 2022-10-07 LAB — BASIC METABOLIC PANEL
Anion gap: 6 (ref 5–15)
BUN: 8 mg/dL (ref 6–20)
CO2: 28 mmol/L (ref 22–32)
Calcium: 9.1 mg/dL (ref 8.9–10.3)
Chloride: 104 mmol/L (ref 98–111)
Creatinine, Ser: 0.96 mg/dL (ref 0.44–1.00)
GFR, Estimated: >60 mL/min (ref >60)
Glucose, Bld: 90 mg/dL (ref 70–99)
Potassium: 4.3 mmol/L (ref 3.5–5.1)
Sodium: 138 mmol/L (ref 135–145)

## 2022-10-07 LAB — URINALYSIS, ROUTINE W REFLEX MICROSCOPIC
Bilirubin Urine: NEGATIVE
Glucose, UA: NEGATIVE mg/dL
Ketones, ur: NEGATIVE mg/dL
Nitrite: NEGATIVE
Protein, ur: NEGATIVE mg/dL
Specific Gravity, Urine: 1.011 (ref 1.005–1.030)
pH: 7 (ref 5.0–8.0)

## 2022-10-07 LAB — RESP PANEL BY RT-PCR (RSV, FLU A&B, COVID)  RVPGX2
Influenza A by PCR: NEGATIVE
Influenza B by PCR: NEGATIVE
Resp Syncytial Virus by PCR: NEGATIVE
SARS Coronavirus 2 by RT PCR: NEGATIVE

## 2022-10-07 LAB — URINE CULTURE: Culture: 10000 — AB

## 2022-10-07 LAB — I-STAT BETA HCG BLOOD, ED (MC, WL, AP ONLY): I-stat hCG, quantitative: <5 m[IU]/mL (ref <5)

## 2022-10-07 MED ORDER — OXCARBAZEPINE 300 MG PO TABS
300.0000 mg | ORAL_TABLET | Freq: Two times a day (BID) | ORAL | Status: DC
  Administered 2022-10-07 – 2022-11-23 (×94): 300 mg via ORAL
  Filled 2022-10-07 (×66): qty 1
  Filled 2022-10-07: qty 14
  Filled 2022-10-07 (×29): qty 1

## 2022-10-07 MED ORDER — NITROFURANTOIN MONOHYD MACRO 100 MG PO CAPS: 100.0000 mg | ORAL_CAPSULE | Freq: Two times a day (BID) | ORAL | 0 refills | Status: DC

## 2022-10-07 MED ORDER — QUETIAPINE FUMARATE 400 MG PO TABS
400.0000 mg | ORAL_TABLET | Freq: Two times a day (BID) | ORAL | Status: DC
  Administered 2022-10-07 – 2022-11-23 (×94): 400 mg via ORAL
  Filled 2022-10-07 (×25): qty 2
  Filled 2022-10-07: qty 1
  Filled 2022-10-07 (×16): qty 2
  Filled 2022-10-07: qty 14
  Filled 2022-10-07 (×49): qty 2
  Filled 2022-10-07: qty 1
  Filled 2022-10-07 (×3): qty 2

## 2022-10-07 MED ORDER — LEVOTHYROXINE SODIUM 100 MCG PO TABS
100.0000 ug | ORAL_TABLET | Freq: Once | ORAL | Status: DC
  Filled 2022-10-07: qty 1
  Filled 2022-10-07: qty 7

## 2022-10-07 MED ORDER — ALUM & MAG HYDROXIDE-SIMETH 200-200-20 MG/5ML PO SUSP
30.0000 mL | ORAL | Status: DC | PRN
  Administered 2022-10-07 – 2022-11-16 (×8): 30 mL via ORAL
  Filled 2022-10-07 (×8): qty 30

## 2022-10-07 MED ORDER — ACETAMINOPHEN 325 MG PO TABS
650.0000 mg | ORAL_TABLET | Freq: Four times a day (QID) | ORAL | Status: DC | PRN
  Administered 2022-10-24 – 2022-11-18 (×4): 650 mg via ORAL
  Filled 2022-10-07 (×6): qty 2

## 2022-10-07 MED ORDER — TRAZODONE HCL 100 MG PO TABS
100.0000 mg | ORAL_TABLET | Freq: Every day | ORAL | Status: DC
  Administered 2022-10-07 – 2022-11-22 (×47): 100 mg via ORAL
  Filled 2022-10-07 (×42): qty 1
  Filled 2022-10-07: qty 7
  Filled 2022-10-07 (×5): qty 1

## 2022-10-07 MED ORDER — IBUPROFEN 400 MG PO TABS
600.0000 mg | ORAL_TABLET | Freq: Once | ORAL | Status: AC
  Administered 2022-10-07: 600 mg via ORAL
  Filled 2022-10-07: qty 1

## 2022-10-07 MED ORDER — MAGNESIUM HYDROXIDE 400 MG/5ML PO SUSP
30.0000 mL | Freq: Every day | ORAL | Status: DC | PRN
  Administered 2022-10-12: 30 mL via ORAL
  Filled 2022-10-07: qty 30

## 2022-10-07 MED ORDER — NITROFURANTOIN MONOHYD MACRO 100 MG PO CAPS
100.0000 mg | ORAL_CAPSULE | Freq: Once | ORAL | Status: AC
  Administered 2022-10-07: 100 mg via ORAL
  Filled 2022-10-07 (×2): qty 1

## 2022-10-07 MED ORDER — TRAZODONE HCL 50 MG PO TABS
50.0000 mg | ORAL_TABLET | Freq: Every evening | ORAL | Status: DC | PRN
  Administered 2022-10-10 – 2022-11-22 (×32): 50 mg via ORAL
  Filled 2022-10-07 (×32): qty 1

## 2022-10-07 MED ORDER — ONDANSETRON 4 MG PO TBDP: 4.0000 mg | ORAL_TABLET | Freq: Three times a day (TID) | ORAL | 0 refills | Status: DC | PRN

## 2022-10-07 MED ORDER — NITROFURANTOIN MONOHYD MACRO 100 MG PO CAPS
100.0000 mg | ORAL_CAPSULE | Freq: Once | ORAL | Status: AC
  Administered 2022-10-07: 100 mg via ORAL
  Filled 2022-10-07: qty 1

## 2022-10-07 NOTE — ED Notes (Signed)
Pt has a Marine scientist

## 2022-10-07 NOTE — ED Provider Notes (Signed)
[facility] EMERGENCY DEPARTMENT Provider Note   CSN: [id number] Arrival date & time: 10/07/22  1102     History  Chief Complaint  Patient presents with   Flank Pain   Dysuria    Nicole [name] is a 31 y.o. female.   Flank Pain  Dysuria Associated symptoms: flank pain   Patient is a 31 year old female presenting today with complaints of back pain burning when she urinates nausea urinary frequency approximately 4 days of symptoms.  She states her symptoms feel exactly how she has felt with UTIs in the past.  She denies any vomiting fevers.  She states she is not on any discomfort currently.  She is being seen at behavioral health urgent care was sent over because of her urinary symptoms.  She has not intact endorsing flank pain so much as some low back pain.  She states this is not unusual for her when she has UTIs.     Home Medications Prior to Admission medications   Medication Sig Start Date End Date Taking? Authorizing Provider  nitrofurantoin, macrocrystal-monohydrate, (MACROBID) 100 MG capsule Take 1 capsule (100 mg total) by mouth 2 (two) times daily. 10/07/22  Yes Shalom Ware S, PA  ondansetron (ZOFRAN-ODT) 4 MG disintegrating tablet Take 1 tablet (4 mg total) by mouth every 8 (eight) hours as needed for nausea or vomiting. 10/07/22  Yes Dniya Neuhaus S, PA  ABILIFY MAINTENA 400 MG PRSY prefilled syringe 400 mg every 28 (twenty-eight) days. 08/10/21   [provider]  cetirizine (ZYRTEC) 10 MG tablet Take 10 mg by mouth daily.    [provider]  cyclobenzaprine (FLEXERIL) 10 MG tablet Take 1 tablet (10 mg total) by mouth 2 (two) times daily as needed for muscle spasms. 06/14/22   Tomi Bamberger, PA-C  fluticasone (FLONASE) 50 MCG/ACT nasal spray Place 1 spray into both nostrils daily. 08/22/21   [provider]  meloxicam (MOBIC) 15 MG tablet Take 15 mg by mouth daily.    [provider]  Oxcarbazepine  (TRILEPTAL) 300 MG tablet Take 1 tablet (300 mg total) by mouth 2 (two) times daily. 07/26/22 08/25/22  Princess Bruins, DO  QUEtiapine (SEROQUEL) 400 MG tablet Take 1 tablet (400 mg total) by mouth 2 (two) times daily. 07/26/22 08/25/22  Princess Bruins, DO  sertraline (ZOLOFT) 50 MG tablet Take 3 tablets (150 mg total) by mouth in the morning. 07/26/22 08/25/22  Princess Bruins, DO  traZODone (DESYREL) 100 MG tablet Take 1 tablet (100 mg total) by mouth at bedtime. 07/26/22   Princess Bruins, DO  valACYclovir (VALTREX) 500 MG tablet Take 500 mg by mouth daily.    [provider]      Allergies    Apple juice, Other, and Watermelon flavor    Review of Systems   Review of Systems  Genitourinary:  Positive for dysuria and flank pain.    Physical Exam Updated Vital Signs BP 104/67   Pulse 98   Temp 99.3 F (37.4 C)   Resp 17   Ht 5\' 1"  (1.549 m)   SpO2 98%   BMI 37.56 kg/m  Physical Exam Vitals and nursing note reviewed.  Constitutional:      General: She is not in acute distress.    Comments: Pleasant well-appearing 31 year old.  In no acute distress.  Sitting comfortably in bed.  Able answer questions appropriately follow commands. No increased work of breathing. Speaking in full sentences.   HENT:     Head: Normocephalic and  atraumatic.     Nose: Nose normal.  Eyes:     General: No scleral icterus. Cardiovascular:     Rate and Rhythm: Normal rate and regular rhythm.     Pulses: Normal pulses.     Heart sounds: Normal heart sounds.  Pulmonary:     Effort: Pulmonary effort is normal. No respiratory distress.     Breath sounds: No wheezing.  Abdominal:     Palpations: Abdomen is soft.     Tenderness: There is no abdominal tenderness.     Comments: No abdominal tenderness or CVAT  Musculoskeletal:     Cervical back: Normal range of motion.     Right lower leg: No edema.     Left lower leg: No edema.  Skin:    General: Skin is warm and dry.     Capillary Refill:  Capillary refill takes less than 2 seconds.  Neurological:     Mental Status: She is alert. Mental status is at baseline.  Psychiatric:        Mood and Affect: Mood normal.        Behavior: Behavior normal.     ED Results / Procedures / Treatments   Labs (all labs ordered are listed, but only abnormal results are displayed) Labs Reviewed  URINALYSIS, ROUTINE W REFLEX MICROSCOPIC - Abnormal; Notable for the following components:      Result Value   APPearance HAZY (*)    Hgb urine dipstick SMALL (*)    Leukocytes,Ua LARGE (*)    Bacteria, UA FEW (*)    All other components within normal limits  RESP PANEL BY RT-PCR (RSV, FLU A&B, COVID)  [id number]  URINE CULTURE  BASIC METABOLIC PANEL  CBC  I-STAT BETA HCG BLOOD, ED ([facility], [facility], [facility] ONLY)    EKG None  Radiology No results found.  Procedures Procedures    Medications Ordered in ED Medications  nitrofurantoin (macrocrystal-monohydrate) (MACROBID) capsule 100 mg (100 mg Oral Given 10/07/22 1341)  ibuprofen (ADVIL) tablet 600 mg (600 mg Oral Given 10/07/22 1341)    ED Course/ Medical Decision Making/ A&P                           Medical Decision Making Amount and/or Complexity of Data Reviewed Labs: ordered.  Risk Prescription drug management.   Patient is a 31 year old female presenting today with complaints of back pain burning when she urinates nausea urinary frequency approximately 4 days of symptoms.  She states her symptoms feel exactly how she has felt with UTIs in the past.  She denies any vomiting fevers.  She states she is not on any discomfort currently.  She is being seen at behavioral health urgent care was sent over because of her urinary symptoms.  She has not intact endorsing flank pain so much as some low back pain.  She states this is not unusual for her when she has UTIs.   Patient's physical exam is benign no abdominal tenderness no guarding or rebound.  No CVA tenderness.  Her symptoms and  physical exam are consistent with a mild UTI.  I do not believe that her back pain represents pyelonephritis and she has no fever or vomiting to indicate that she does.  Will give her some Zofran in case she does have some vomiting.  Urinalysis with small blood, bacteria, leukocytes, COVID influenza negative, CBC unremarkable BMP unremarkable as hCG negative for pregnancy.  Vital signs normal  Recommend Tylenol ibuprofen  hydration at home.  Will obtain urine culture.  Did offer additional workup given the patient is having back pain and additional workup would primarily consist of a CT renal stone study.  Patient states that she feels that this is similar enough to her usual UTI symptoms she does not feel that she needs any additional workup.  Final Clinical Impression(s) / ED Diagnoses Final diagnoses:  Lower urinary tract infectious disease    Rx / DC Orders ED Discharge Orders          Ordered    nitrofurantoin, macrocrystal-monohydrate, (MACROBID) 100 MG capsule  2 times daily        10/07/22 1352    ondansetron (ZOFRAN-ODT) 4 MG disintegrating tablet  Every 8 hours PRN        10/07/22 1425              Solon Augusta [place], [place] 10/07/22 1426    Glyn Ade, MD 10/08/22 0725

## 2022-10-07 NOTE — ED Notes (Signed)
Pt is in the bed resting. Respirations are even and unlabored. No acute distress noted. Will continue to monitor for safety 

## 2022-10-07 NOTE — ED Provider Triage Note (Signed)
Emergency Medicine Provider Triage Evaluation Note  Nicole [name] , a 31 y.o. female  was evaluated in triage.  Pt complains of bilateral flank pain and low back pain in last 4 days.  She reports burning sensation with urination.  No increase in urinary urgency or frequency.  No blood in the stool or urine.  No fever at home.  She also reports some mild lower abdominal pain.  Review of Systems  Positive: As above Negative: As above  Physical Exam  BP 104/67   Pulse 98   Temp 99.3 F (37.4 C)   Resp 17   Ht 5\' 1"  (1.549 m)   SpO2 98%   BMI 37.56 kg/m  Gen:   Awake, no distress   Resp:  Normal effort  MSK:   Moves extremities without difficulty  Other:  TTP to lower back and bilateral flank pain.  Medical Decision Making  Medically screening exam initiated at 11:41 AM.  Appropriate orders placed.  Nicole [name] was informed that the remainder of the evaluation will be completed by another provider, this initial triage assessment does not replace that evaluation, and the importance of remaining in the ED until their evaluation is complete.     Nicole [name], Nicole [name] 10/08/22 2143

## 2022-10-07 NOTE — ED Triage Notes (Signed)
Pt c/o bilateral flank pain, dysuria,and nausea for about 4 days. Pt also reports sore throat for the past 2 days. Pt did arrives from [facility]. She currently does not endorse SI, HI, or any other complaints. Pt is alert and oriented x4. She was voluntary at [facility].

## 2022-10-07 NOTE — ED Notes (Signed)
Safe transport taking pt back to [facility]

## 2022-10-07 NOTE — ED Notes (Signed)
Pt sleeping@this time. Breathing even and unlabored. Will continue to monitor for safety 

## 2022-10-07 NOTE — ED Notes (Signed)
Pt sleeping in recliner bed. Easily awakened. Denies SI/HI/AVH. No noted resp distress. Will continue to monitor for safety

## 2022-10-07 NOTE — ED Notes (Signed)
Report called to [facility] ED charge nurse.Nicole [name]. Safe transport has been called.

## 2022-10-07 NOTE — ED Provider Notes (Addendum)
Behavioral Health Progress Note  Date and Time: 10/07/2022 10:35 AM Name: Nicole [name] MRN:  [id number]  Subjective:    Nicole [name] is a 31 y.o. female, with PMH of SCZ unspecified, MDD with psychosis, who presented voluntary to [facility] (07/25/2022) via [facility] after a verbal altercation with [name] (not patient's legal guardian) for transient SI.  This is patient's fourth visit to [facility] for similar concerns this year. Patient has been dismissed from previous group home due to threatening SI and HI, then leaving the group home.   Legal Guardian: [name]  Patient evaluated on the unit, endorses fatigue and generalized malaise today.  Reports she has had worsening dysuria and is now experiencing flank pain as of last night. There is flank pain noted on palpation bilaterally. Discussed transfer to ED given patient's symptoms, patient agrees.   She denies suicidal ideation, homicidal ideation, and auditory/visual hallucinations. She denies any paranoid ideations. She denies thought insertion, thought withdrawal, and ideas of reference.   She denies any side effects to currently prescribed psychiatric medications.    Diagnosis:  Final diagnoses:  Severe episode of recurrent major depressive disorder, with psychotic features (HCC)    Total Time spent with patient: 15 minutes  Past Psychiatric History: see HPI Past Medical History:  Past Medical History:  Diagnosis Date   Anxiety    Depression    Hypothyroidism 08/07/2022   Tobacco use disorder 08/07/2022    Past Surgical History:  Procedure Laterality Date   WISDOM TOOTH EXTRACTION Bilateral 2020   Family History:  Family History  Problem Relation Age of Onset   Hypertension Father    Diabetes Father    Family Psychiatric  History: None reported Social History:  Social History   Substance and Sexual Activity  Alcohol Use Never      Social History   Substance and Sexual Activity  Drug Use Never    Social History   Socioeconomic History   Marital status: Single    Spouse name: Not on file   Number of children: Not on file   Years of education: Not on file   Highest education level: Not on file  Occupational History   Not on file  Tobacco Use   Smoking status: Every Day    Types: Cigarettes   Smokeless tobacco: Never  Vaping Use   Vaping Use: Some days  Substance and Sexual Activity   Alcohol use: Never   Drug use: Never   Sexual activity: Yes    Partners: Female    Birth control/protection: Implant  Other Topics Concern   Not on file  Social History Narrative   Not on file   Social Determinants of Health   Financial Resource Strain: Not on file  Food Insecurity: Not on file  Transportation Needs: Not on file  Physical Activity: Not on file  Stress: Not on file  Social Connections: Not on file   SDOH:  SDOH Screenings   Depression (PHQ2-9): Low Risk  (06/10/2022)  Tobacco Use: High Risk (09/30/2022)   Additional Social History:    Pain Medications: SEE MAR Prescriptions: SEE MAR Over the Counter: SEE MAR History of alcohol / drug use?: Yes Longest period of sobriety (when/how long): N/A Negative Consequences of Use:  (NONE) Withdrawal Symptoms: None Name of Substance 1: ETOH IN PAST--PT CURRENTLY DENIES 1 - Frequency: SOCIAL USE ETOH IN PAST 1 - Method of Aquiring: LEGAL 1- Route of  Use: ORAL DRINK Name of Substance 2: NICOTINE-VAPE AND CIGARETTES 2 - Amount (size/oz): LESS THAN ONE PACK 2 - Frequency: DAILY 2 - Method of Aquiring: LEGAL 2 - Route of Substance Use: ORAL SMOKE                Sleep: Good  Appetite:  Good  Current Medications:  Current Facility-Administered Medications  Medication Dose Route Frequency Provider Last Rate Last Admin   acetaminophen (TYLENOL) tablet 650 mg  650 mg Oral Q6H PRN Onuoha, Chinwendu V, NP   650 mg at 09/21/22 2200    ARIPiprazole ER (ABILIFY MAINTENA) injection 400 mg  400 mg Intramuscular Q28 days Princess Bruins, DO   400 mg at 09/27/22 1445   docusate sodium (COLACE) capsule 100 mg  100 mg Oral Daily [name], MD   100 mg at 10/07/22 0935   hydrOXYzine (ATARAX) tablet 25 mg  25 mg Oral TID PRN Onuoha, Chinwendu V, NP   25 mg at 10/06/22 2128   ibuprofen (ADVIL) tablet 400 mg  400 mg Oral Q6H PRN Onuoha, Chinwendu V, NP   400 mg at 09/29/22 1942   levothyroxine (SYNTHROID) tablet 100 mcg  100 mcg Oral Q0600 Princess Bruins, DO   100 mcg at 10/07/22 4098   loratadine (CLARITIN) tablet 10 mg  10 mg Oral Daily Rankin, Shuvon B, NP   10 mg at 10/07/22 0935   menthol-cetylpyridinium (CEPACOL) lozenge 3 mg  1 lozenge Oral Q12H PRN Carrion-Carrero, Karle Starch, MD   3 mg at 10/06/22 1824   metFORMIN (GLUCOPHAGE) tablet 500 mg  500 mg Oral Q breakfast Park Pope, MD   500 mg at 10/07/22 0802   nicotine (NICODERM CQ - dosed in mg/24 hours) patch 14 mg  14 mg Transdermal Daily PRN Carrion-Carrero, Karle Starch, MD       ondansetron (ZOFRAN-ODT) disintegrating tablet 4 mg  4 mg Oral Q8H PRN Sindy Guadeloupe, NP   4 mg at 09/30/22 2336   Oxcarbazepine (TRILEPTAL) tablet 300 mg  300 mg Oral BID Onuoha, Chinwendu V, NP   300 mg at 10/07/22 0935   pantoprazole (PROTONIX) EC tablet 40 mg  40 mg Oral Daily Park Pope, MD   40 mg at 10/07/22 1191   polyethylene glycol (MIRALAX / GLYCOLAX) packet 17 g  17 g Oral Daily PRN Ajibola, Ene A, NP   17 g at 10/04/22 1010   QUEtiapine (SEROQUEL) tablet 400 mg  400 mg Oral BID Onuoha, Chinwendu V, NP   400 mg at 10/07/22 0935   sertraline (ZOLOFT) tablet 150 mg  150 mg Oral Daily Onuoha, Chinwendu V, NP   150 mg at 10/07/22 0934   simethicone (MYLICON) chewable tablet 80 mg  80 mg Oral Q6H PRN Sindy Guadeloupe, NP   80 mg at 09/29/22 2144   traZODone (DESYREL) tablet 100 mg  100 mg Oral QHS Ardis Hughs, NP   100 mg at 10/06/22 2128   valACYclovir (VALTREX) tablet 500 mg  500 mg Oral Daily  Princess Bruins, DO   500 mg at 10/07/22 0935   ziprasidone (GEODON) injection 20 mg  20 mg Intramuscular Q12H PRN Onuoha, Chinwendu V, NP   20 mg at 09/19/22 2133   Current Outpatient Medications  Medication Sig Dispense Refill   ABILIFY MAINTENA 400 MG PRSY prefilled syringe 400 mg every 28 (twenty-eight) days.     cetirizine (ZYRTEC) 10 MG tablet Take 10 mg by mouth daily.     cyclobenzaprine (FLEXERIL) 10 MG tablet Take 1  tablet (10 mg total) by mouth 2 (two) times daily as needed for muscle spasms. 20 tablet 0   fluticasone (FLONASE) 50 MCG/ACT nasal spray Place 1 spray into both nostrils daily.     meloxicam (MOBIC) 15 MG tablet Take 15 mg by mouth daily.     Oxcarbazepine (TRILEPTAL) 300 MG tablet Take 1 tablet (300 mg total) by mouth 2 (two) times daily. 60 tablet 0   QUEtiapine (SEROQUEL) 400 MG tablet Take 1 tablet (400 mg total) by mouth 2 (two) times daily. 60 tablet 0   sertraline (ZOLOFT) 50 MG tablet Take 3 tablets (150 mg total) by mouth in the morning. 90 tablet 0   traZODone (DESYREL) 100 MG tablet Take 1 tablet (100 mg total) by mouth at bedtime. 30 tablet 0   valACYclovir (VALTREX) 500 MG tablet Take 500 mg by mouth daily.      Labs  Lab Results:  Admission on 07/25/2022  Component Date Value Ref Range Status   SARS Coronavirus 2 by RT PCR 07/25/2022 NEGATIVE  NEGATIVE Final   Comment: (NOTE) SARS-CoV-2 target nucleic acids are NOT DETECTED.  The SARS-CoV-2 RNA is generally detectable in upper respiratory specimens during the acute phase of infection. The lowest concentration of SARS-CoV-2 viral copies this assay can detect is 138 copies/mL. A negative result does not preclude SARS-Cov-2 infection and should not be used as the sole basis for treatment or other patient management decisions. A negative result may occur with  improper specimen collection/handling, submission of specimen other than nasopharyngeal swab, presence of viral mutation(s) within the areas  targeted by this assay, and inadequate number of viral copies(<138 copies/mL). A negative result must be combined with clinical observations, patient history, and epidemiological information. The expected result is Negative.  Fact Sheet for Patients:  BloggerCourse.com  Fact Sheet for Healthcare Providers:  SeriousBroker.it  This test is no                          t yet approved or cleared by the [place] FDA and  has been authorized for detection and/or diagnosis of SARS-CoV-2 by FDA under an Emergency Use Authorization (EUA). This EUA will remain  in effect (meaning this test can be used) for the duration of the COVID-19 declaration under Section 564(b)(1) of the Act, 21 U.S.C.section 360bbb-3(b)(1), unless the authorization is terminated  or revoked sooner.       Influenza A by PCR 07/25/2022 NEGATIVE  NEGATIVE Final   Influenza B by PCR 07/25/2022 NEGATIVE  NEGATIVE Final   Comment: (NOTE) The Xpert Xpress SARS-CoV-2/FLU/RSV plus assay is intended as an aid in the diagnosis of influenza from Nasopharyngeal swab specimens and should not be used as a sole basis for treatment. Nasal washings and aspirates are unacceptable for Xpert Xpress SARS-CoV-2/FLU/RSV testing.  Fact Sheet for Patients: BloggerCourse.com  Fact Sheet for Healthcare Providers: SeriousBroker.it  This test is not yet approved or cleared by the [place] FDA and has been authorized for detection and/or diagnosis of SARS-CoV-2 by FDA under an Emergency Use Authorization (EUA). This EUA will remain in effect (meaning this test can be used) for the duration of the COVID-19 declaration under Section 564(b)(1) of the Act, 21 U.S.C. section 360bbb-3(b)(1), unless the authorization is terminated or revoked.  Performed at [facility], [address] [place], [place] [address]    WBC 07/25/2022  8.3  4.0 - 10.5 K/uL Final   RBC 07/25/2022 4.44  3.87 - 5.11 MIL/uL Final   Hemoglobin 07/25/2022 13.7  12.0 - 15.0 g/dL Final   HCT 16/07/9603 40.2  36.0 - 46.0 % Final   MCV 07/25/2022 90.5  80.0 - 100.0 fL Final   MCH 07/25/2022 30.9  26.0 - 34.0 pg Final   MCHC 07/25/2022 34.1  30.0 - 36.0 g/dL Final   RDW 54/06/8118 12.2  11.5 - 15.5 % Final   Platelets 07/25/2022 248  150 - 400 K/uL Final   nRBC 07/25/2022 0.0  0.0 - 0.2 % Final   Neutrophils Relative % 07/25/2022 43  % Final   Neutro Abs 07/25/2022 3.6  1.7 - 7.7 K/uL Final   Lymphocytes Relative 07/25/2022 52  % Final   Lymphs Abs 07/25/2022 4.2 (H)  0.7 - 4.0 K/uL Final   Monocytes Relative 07/25/2022 5  % Final   Monocytes Absolute 07/25/2022 0.4  0.1 - 1.0 K/uL Final   Eosinophils Relative 07/25/2022 0  % Final   Eosinophils Absolute 07/25/2022 0.0  0.0 - 0.5 K/uL Final   Basophils Relative 07/25/2022 0  % Final   Basophils Absolute 07/25/2022 0.0  0.0 - 0.1 K/uL Final   Immature Granulocytes 07/25/2022 0  % Final   Abs Immature Granulocytes 07/25/2022 0.02  0.00 - 0.07 K/uL Final   Performed at [facility], [address] [place], [place] [address]   Sodium 07/25/2022 138  135 - 145 mmol/L Final   Potassium 07/25/2022 4.0  3.5 - 5.1 mmol/L Final   Chloride 07/25/2022 104  98 - 111 mmol/L Final   CO2 07/25/2022 29  22 - 32 mmol/L Final   Glucose, Bld 07/25/2022 83  70 - 99 mg/dL Final   Glucose reference range applies only to samples taken after fasting for at least 8 hours.   BUN 07/25/2022 11  6 - 20 mg/dL Final   Creatinine, Ser 07/25/2022 0.97  0.44 - 1.00 mg/dL Final   Calcium 95/62/1308 9.2  8.9 - 10.3 mg/dL Final   Total Protein 65/78/4696 7.0  6.5 - 8.1 g/dL Final   Albumin 29/52/8413 3.8  3.5 - 5.0 g/dL Final   AST 24/40/1027 18  15 - 41 U/L Final   ALT 07/25/2022 22  0 - 44 U/L Final   Alkaline Phosphatase 07/25/2022 64  38 - 126 U/L Final   Total Bilirubin 07/25/2022 0.2 (L)  0.3 - 1.2 mg/dL Final    GFR, Estimated 07/25/2022 >60  >60 mL/min Final   Comment: (NOTE) Calculated using the CKD-EPI Creatinine Equation (2021)    Anion gap 07/25/2022 5  5 - 15 Final   Performed at [facility], [address] [place], [place] [address]   POC Amphetamine UR 07/25/2022 None Detected  NONE DETECTED (Cut Off Level 1000 ng/mL) Preliminary   POC Secobarbital (BAR) 07/25/2022 None Detected  NONE DETECTED (Cut Off Level 300 ng/mL) Preliminary   POC Buprenorphine (BUP) 07/25/2022 None Detected  NONE DETECTED (Cut Off Level 10 ng/mL) Preliminary   POC Oxazepam (BZO) 07/25/2022 None Detected  NONE DETECTED (Cut Off Level 300 ng/mL) Preliminary   POC Cocaine UR 07/25/2022 None Detected  NONE DETECTED (Cut Off Level 300 ng/mL) Preliminary   POC Methamphetamine UR 07/25/2022 None Detected  NONE DETECTED (Cut Off Level 1000 ng/mL) Preliminary   POC Morphine 07/25/2022 None Detected  NONE DETECTED (Cut Off Level 300 ng/mL) Preliminary   POC Methadone UR 07/25/2022 None Detected  NONE DETECTED (Cut Off Level 300 ng/mL) Preliminary  POC Oxycodone UR 07/25/2022 None Detected  NONE DETECTED (Cut Off Level 100 ng/mL) Preliminary   POC Marijuana UR 07/25/2022 None Detected  NONE DETECTED (Cut Off Level 50 ng/mL) Preliminary   SARSCOV2ONAVIRUS 2 AG 07/25/2022 NEGATIVE  NEGATIVE Final   Comment: (NOTE) SARS-CoV-2 antigen NOT DETECTED.   Negative results are presumptive.  Negative results do not preclude SARS-CoV-2 infection and should not be used as the sole basis for treatment or other patient management decisions, including infection  control decisions, particularly in the presence of clinical signs and  symptoms consistent with COVID-19, or in those who have been in contact with the virus.  Negative results must be combined with clinical observations, patient history, and epidemiological information. The expected result is Negative.  Fact Sheet for Patients:  https://www.jennings-kim.com/  Fact Sheet for Healthcare Providers: https://alexander-rogers.biz/  This test is not yet approved or cleared by the [place] FDA and  has been authorized for detection and/or diagnosis of SARS-CoV-2 by FDA under an Emergency Use Authorization (EUA).  This EUA will remain in effect (meaning this test can be used) for the duration of  the COV                          ID-19 declaration under Section 564(b)(1) of the Act, 21 U.S.C. section 360bbb-3(b)(1), unless the authorization is terminated or revoked sooner.     Cholesterol 07/25/2022 181  0 - 200 mg/dL Final   Triglycerides 09/81/1914 118  <150 mg/dL Final   HDL 78/29/5621 45  >40 mg/dL Final   Total CHOL/HDL Ratio 07/25/2022 4.0  RATIO Final   VLDL 07/25/2022 24  0 - 40 mg/dL Final   LDL Cholesterol 07/25/2022 112 (H)  0 - 99 mg/dL Final   Comment:        Total Cholesterol/HDL:CHD Risk Coronary Heart Disease Risk Table                     Men   Women  1/2 Average Risk   3.4   3.3  Average Risk       5.0   4.4  2 X Average Risk   9.6   7.1  3 X Average Risk  23.4   11.0        Use the calculated Patient Ratio above and the CHD Risk Table to determine the patient's CHD Risk.        ATP III CLASSIFICATION (LDL):  <100     mg/dL   Optimal  308-657  mg/dL   Near or Above                    Optimal  130-159  mg/dL   Borderline  846-962  mg/dL   High  >952     mg/dL   Very High Performed at [facility], [address], [place], [place] [address]    TSH 07/25/2022 6.668 (H)  0.350 - 4.500 uIU/mL Final   Comment: Performed by a 3rd Generation assay with a functional sensitivity of <=0.01 uIU/mL. Performed at [facility], [address], [place], [place] [address]    Glucose-Capillary 07/26/2022 104 (H)  70 - 99 mg/dL Final   Glucose reference range applies only to samples taken after fasting for at least 8 hours.   T3, Free 07/31/2022 2.3  2.0 - 4.4  pg/mL Final   Comment: (NOTE) Performed At: [facility] [address]  [place], [place] [id number] Jolene Schimke MD VO:[id number]    Free T4 07/31/2022 0.60 (L)  0.61 - 1.12 ng/dL Final   Comment: (NOTE) Biotin ingestion may interfere with free T4 tests. If the results are inconsistent with the TSH level, previous test results, or the clinical presentation, then consider biotin interference. If needed, order repeat testing after stopping biotin. Performed at [facility], [address], [place], [place] [address]    Glucose-Capillary 08/29/2022 100 (H)  70 - 99 mg/dL Final   Glucose reference range applies only to samples taken after fasting for at least 8 hours.   TSH 09/05/2022 0.793  0.350 - 4.500 uIU/mL Final   Comment: Performed by a 3rd Generation assay with a functional sensitivity of <=0.01 uIU/mL. Performed at [facility], [address] [place], [place] [address]    Free T4 09/05/2022 0.73  0.61 - 1.12 ng/dL Final   Comment: (NOTE) Biotin ingestion may interfere with free T4 tests. If the results are inconsistent with the TSH level, previous test results, or the clinical presentation, then consider biotin interference. If needed, order repeat testing after stopping biotin. Performed at [facility], [address] [place], [place] [address]    Preg Test, Ur 09/06/2022 Negative  Negative Final   Preg Test, Ur 09/06/2022 NEGATIVE  NEGATIVE Final   Comment:        THE SENSITIVITY OF THIS METHODOLOGY IS >24 mIU/mL    Preg Test, Ur 09/05/2022 NEGATIVE  NEGATIVE Final   Comment:        THE SENSITIVITY OF THIS METHODOLOGY IS >24 mIU/mL    Sodium 09/13/2022 136  135 - 145 mmol/L Final   Potassium 09/13/2022 4.5  3.5 - 5.1 mmol/L Final   Chloride 09/13/2022 105  98 - 111 mmol/L Final   CO2 09/13/2022 21 (L)  22 - 32 mmol/L Final   Glucose, Bld 09/13/2022 111 (H)  70 - 99 mg/dL Final   Glucose reference range applies only to samples taken  after fasting for at least 8 hours.   BUN 09/13/2022 14  6 - 20 mg/dL Final   Creatinine, Ser 09/13/2022 0.92  0.44 - 1.00 mg/dL Final   Calcium 95/18/8416 9.1  8.9 - 10.3 mg/dL Final   GFR, Estimated 09/13/2022 >60  >60 mL/min Final   Comment: (NOTE) Calculated using the CKD-EPI Creatinine Equation (2021)    Anion gap 09/13/2022 10  5 - 15 Final   Performed at [facility], [address], [place], [place] [address]   Vitamin B-12 09/13/2022 585  180 - 914 pg/mL Final   Comment: (NOTE) This assay is not validated for testing neonatal or myeloproliferative syndrome specimens for Vitamin B12 levels. Performed at [facility], [address], [place], [place] [address]    Color, Urine 09/14/2022 YELLOW  YELLOW Final   APPearance 09/14/2022 HAZY (A)  CLEAR Final   Specific Gravity, Urine 09/14/2022 1.025  1.005 - 1.030 Final   pH 09/14/2022 5.0  5.0 - 8.0 Final   Glucose, UA 09/14/2022 NEGATIVE  NEGATIVE mg/dL Final   Hgb urine dipstick 09/14/2022 NEGATIVE  NEGATIVE Final   Bilirubin Urine 09/14/2022 NEGATIVE  NEGATIVE Final   Ketones, ur 09/14/2022 NEGATIVE  NEGATIVE mg/dL Final   Protein, ur 93/23/5573 NEGATIVE  NEGATIVE mg/dL Final   Nitrite 22/11/5425 NEGATIVE  NEGATIVE Final   Leukocytes,Ua 09/14/2022 TRACE (A)  NEGATIVE Final   RBC / HPF 09/14/2022 0-5  0 - 5  RBC/hpf Final   WBC, UA 09/14/2022 0-5  0 - 5 WBC/hpf Final   Bacteria, UA 09/14/2022 RARE (A)  NONE SEEN Final   Squamous Epithelial / LPF 09/14/2022 0-5  0 - 5 Final   Mucus 09/14/2022 PRESENT   Final   Performed at [facility], [address], [place], [place] [address]   Glucose-Capillary 09/16/2022 105 (H)  70 - 99 mg/dL Final   Glucose reference range applies only to samples taken after fasting for at least 8 hours.   SARS Coronavirus 2 by RT PCR 09/30/2022 NEGATIVE  NEGATIVE Final   Comment: (NOTE) SARS-CoV-2 target nucleic acids are NOT DETECTED.  The SARS-CoV-2 RNA is generally detectable in  upper respiratory specimens during the acute phase of infection. The lowest concentration of SARS-CoV-2 viral copies this assay can detect is 138 copies/mL. A negative result does not preclude SARS-Cov-2 infection and should not be used as the sole basis for treatment or other patient management decisions. A negative result may occur with  improper specimen collection/handling, submission of specimen other than nasopharyngeal swab, presence of viral mutation(s) within the areas targeted by this assay, and inadequate number of viral copies(<138 copies/mL). A negative result must be combined with clinical observations, patient history, and epidemiological information. The expected result is Negative.  Fact Sheet for Patients:  BloggerCourse.com  Fact Sheet for Healthcare Providers:  SeriousBroker.it  This test is no                          t yet approved or cleared by the [place] FDA and  has been authorized for detection and/or diagnosis of SARS-CoV-2 by FDA under an Emergency Use Authorization (EUA). This EUA will remain  in effect (meaning this test can be used) for the duration of the COVID-19 declaration under Section 564(b)(1) of the Act, 21 U.S.C.section 360bbb-3(b)(1), unless the authorization is terminated  or revoked sooner.       Influenza A by PCR 09/30/2022 NEGATIVE  NEGATIVE Final   Influenza B by PCR 09/30/2022 NEGATIVE  NEGATIVE Final   Comment: (NOTE) The Xpert Xpress SARS-CoV-2/FLU/RSV plus assay is intended as an aid in the diagnosis of influenza from Nasopharyngeal swab specimens and should not be used as a sole basis for treatment. Nasal washings and aspirates are unacceptable for Xpert Xpress SARS-CoV-2/FLU/RSV testing.  Fact Sheet for Patients: BloggerCourse.com  Fact Sheet for Healthcare Providers: SeriousBroker.it  This test is not yet approved  or cleared by the [place] FDA and has been authorized for detection and/or diagnosis of SARS-CoV-2 by FDA under an Emergency Use Authorization (EUA). This EUA will remain in effect (meaning this test can be used) for the duration of the COVID-19 declaration under Section 564(b)(1) of the Act, 21 U.S.C. section 360bbb-3(b)(1), unless the authorization is terminated or revoked.     Resp Syncytial Virus by PCR 09/30/2022 NEGATIVE  NEGATIVE Final   Comment: (NOTE) Fact Sheet for Patients: BloggerCourse.com  Fact Sheet for Healthcare Providers: SeriousBroker.it  This test is not yet approved or cleared by the [place] FDA and has been authorized for detection and/or diagnosis of SARS-CoV-2 by FDA under an Emergency Use Authorization (EUA). This EUA will remain in effect (meaning this test can be used) for the duration of the COVID-19 declaration under Section 564(b)(1) of the Act, 21 U.S.C. section 360bbb-3(b)(1), unless the authorization is terminated or revoked.  Performed at [facility], [address] [place],  [place] [address]    Sodium 10/01/2022 136  135 - 145 mmol/L Final   Potassium 10/01/2022 3.8  3.5 - 5.1 mmol/L Final   Chloride 10/01/2022 104  98 - 111 mmol/L Final   CO2 10/01/2022 27  22 - 32 mmol/L Final   Glucose, Bld 10/01/2022 98  70 - 99 mg/dL Final   Glucose reference range applies only to samples taken after fasting for at least 8 hours.   BUN 10/01/2022 12  6 - 20 mg/dL Final   Creatinine, Ser 10/01/2022 0.98  0.44 - 1.00 mg/dL Final   Calcium 94/85/4627 8.9  8.9 - 10.3 mg/dL Final   GFR, Estimated 10/01/2022 >60  >60 mL/min Final   Comment: (NOTE) Calculated using the CKD-EPI Creatinine Equation (2021)    Anion gap 10/01/2022 5  5 - 15 Final   Performed at [facility], [address], [place], [place] [address]   WBC 10/01/2022 5.1  4.0 - 10.5 K/uL Final   RBC 10/01/2022 4.31  3.87  - 5.11 MIL/uL Final   Hemoglobin 10/01/2022 12.7  12.0 - 15.0 g/dL Final   HCT 93/81/8299 38.8  36.0 - 46.0 % Final   MCV 10/01/2022 90.0  80.0 - 100.0 fL Final   MCH 10/01/2022 29.5  26.0 - 34.0 pg Final   MCHC 10/01/2022 32.7  30.0 - 36.0 g/dL Final   RDW 37/16/9678 12.4  11.5 - 15.5 % Final   Platelets 10/01/2022 300  150 - 400 K/uL Final   nRBC 10/01/2022 0.0  0.0 - 0.2 % Final   Performed at [facility], [address], [place], [place] [address]   Lipase 10/01/2022 29  11 - 51 U/L Final   Performed at [facility], [address], [place], [place] [address]   Color, Urine 10/05/2022 YELLOW  YELLOW Final   APPearance 10/05/2022 HAZY (A)  CLEAR Final   Specific Gravity, Urine 10/05/2022 1.018  1.005 - 1.030 Final   pH 10/05/2022 5.0  5.0 - 8.0 Final   Glucose, UA 10/05/2022 NEGATIVE  NEGATIVE mg/dL Final   Hgb urine dipstick 10/05/2022 NEGATIVE  NEGATIVE Final   Bilirubin Urine 10/05/2022 NEGATIVE  NEGATIVE Final   Ketones, ur 10/05/2022 NEGATIVE  NEGATIVE mg/dL Final   Protein, ur 25/85/2778 NEGATIVE  NEGATIVE mg/dL Final   Nitrite 24/23/5361 NEGATIVE  NEGATIVE Final   Leukocytes,Ua 10/05/2022 TRACE (A)  NEGATIVE Final   RBC / HPF 10/05/2022 0-5  0 - 5 RBC/hpf Final   WBC, UA 10/05/2022 0-5  0 - 5 WBC/hpf Final   Bacteria, UA 10/05/2022 RARE (A)  NONE SEEN Final   Squamous Epithelial / LPF 10/05/2022 0-5  0 - 5 Final   Mucus 10/05/2022 PRESENT   Final   Performed at [facility], [address], [place], [place] [address]  Admission on 07/04/2022, Discharged on 07/05/2022  Component Date Value Ref Range Status   Sodium 07/04/2022 142  135 - 145 mmol/L Final   Potassium 07/04/2022 4.4  3.5 - 5.1 mmol/L Final   Chloride 07/04/2022 109  98 - 111 mmol/L Final   CO2 07/04/2022 26  22 - 32 mmol/L Final   Glucose, Bld 07/04/2022 96  70 - 99 mg/dL Final   Glucose reference range applies only to samples taken after fasting for at least 8 hours.   BUN 07/04/2022 15   6 - 20 mg/dL Final   Creatinine, Ser 07/04/2022 0.83  0.44 - 1.00 mg/dL Final  Calcium 07/04/2022 9.3  8.9 - 10.3 mg/dL Final   Total Protein 16/07/9603 7.2  6.5 - 8.1 g/dL Final   Albumin 54/06/8118 3.9  3.5 - 5.0 g/dL Final   AST 14/78/2956 23  15 - 41 U/L Final   ALT 07/04/2022 28  0 - 44 U/L Final   Alkaline Phosphatase 07/04/2022 77  38 - 126 U/L Final   Total Bilirubin 07/04/2022 0.4  0.3 - 1.2 mg/dL Final   GFR, Estimated 07/04/2022 >60  >60 mL/min Final   Comment: (NOTE) Calculated using the CKD-EPI Creatinine Equation (2021)    Anion gap 07/04/2022 7  5 - 15 Final   Performed at [facility], [address], [place], [place] [address]   Alcohol, Ethyl (B) 07/04/2022 <10  <10 mg/dL Final   Comment: (NOTE) Lowest detectable limit for serum alcohol is 10 mg/dL.  For medical purposes only. Performed at [facility], [address] [place], [place] [address]    WBC 07/04/2022 7.0  4.0 - 10.5 K/uL Final   RBC 07/04/2022 4.18  3.87 - 5.11 MIL/uL Final   Hemoglobin 07/04/2022 12.8  12.0 - 15.0 g/dL Final   HCT 69/62/9528 39.1  36.0 - 46.0 % Final   MCV 07/04/2022 93.5  80.0 - 100.0 fL Final   MCH 07/04/2022 30.6  26.0 - 34.0 pg Final   MCHC 07/04/2022 32.7  30.0 - 36.0 g/dL Final   RDW 41/32/4401 12.9  11.5 - 15.5 % Final   Platelets 07/04/2022 243  150 - 400 K/uL Final   nRBC 07/04/2022 0.0  0.0 - 0.2 % Final   Neutrophils Relative % 07/04/2022 43  % Final   Neutro Abs 07/04/2022 3.0  1.7 - 7.7 K/uL Final   Lymphocytes Relative 07/04/2022 50  % Final   Lymphs Abs 07/04/2022 3.5  0.7 - 4.0 K/uL Final   Monocytes Relative 07/04/2022 7  % Final   Monocytes Absolute 07/04/2022 0.5  0.1 - 1.0 K/uL Final   Eosinophils Relative 07/04/2022 0  % Final   Eosinophils Absolute 07/04/2022 0.0  0.0 - 0.5 K/uL Final   Basophils Relative 07/04/2022 0  % Final   Basophils Absolute 07/04/2022 0.0  0.0 - 0.1 K/uL Final   Immature Granulocytes  07/04/2022 0  % Final   Abs Immature Granulocytes 07/04/2022 0.01  0.00 - 0.07 K/uL Final   Performed at [facility], [address], [place], [place] [address]   I-stat hCG, quantitative 07/04/2022 <5.0  <5 mIU/mL Final   Comment 3 07/04/2022          Final   Comment:   GEST. AGE      CONC.  (mIU/mL)   <=1 WEEK        5 - 50     2 WEEKS       50 - 500     3 WEEKS       100 - 10,000     4 WEEKS     1,000 - 30,000        FEMALE AND NON-PREGNANT FEMALE:     LESS THAN 5 mIU/mL   Admission on 06/14/2022, Discharged on 06/14/2022  Component Date Value Ref Range Status   Color, UA 06/14/2022 yellow  yellow Final   Clarity, UA 06/14/2022 cloudy (A)  clear Final   Glucose, UA 06/14/2022 negative  negative mg/dL Final   Bilirubin, UA 36/64/4034 negative  negative Final   Ketones, POC UA 06/14/2022 negative  negative mg/dL Final   Spec Grav, UA 16/07/9603 1.025  1.010 - 1.025 Final   Blood, UA 06/14/2022 negative  negative Final   pH, UA 06/14/2022 7.5  5.0 - 8.0 Final   Protein Ur, POC 06/14/2022 =30 (A)  negative mg/dL Final   Urobilinogen, UA 06/14/2022 0.2  0.2 or 1.0 E.U./dL Final   Nitrite, UA 54/06/8118 Negative  Negative Final   Leukocytes, UA 06/14/2022 Negative  Negative Final   Preg Test, Ur 06/14/2022 Negative  Negative Final   Specimen Description 06/14/2022 URINE, CLEAN CATCH   Final   Special Requests 06/14/2022    Final                   Value:NONE Performed at [facility], [address], [place], [place] [address]    Culture 06/14/2022 MULTIPLE SPECIES PRESENT, SUGGEST RECOLLECTION (A)   Final   Report Status 06/14/2022 06/16/2022 FINAL   Final  Admission on 06/07/2022, Discharged on 06/10/2022  Component Date Value Ref Range Status   SARS Coronavirus 2 by RT PCR 06/07/2022 NEGATIVE  NEGATIVE Final   Comment: (NOTE) SARS-CoV-2 target nucleic acids are NOT DETECTED.  The SARS-CoV-2 RNA is generally detectable in upper respiratory specimens  during the acute phase of infection. The lowest concentration of SARS-CoV-2 viral copies this assay can detect is 138 copies/mL. A negative result does not preclude SARS-Cov-2 infection and should not be used as the sole basis for treatment or other patient management decisions. A negative result may occur with  improper specimen collection/handling, submission of specimen other than nasopharyngeal swab, presence of viral mutation(s) within the areas targeted by this assay, and inadequate number of viral copies(<138 copies/mL). A negative result must be combined with clinical observations, patient history, and epidemiological information. The expected result is Negative.  Fact Sheet for Patients:  BloggerCourse.com  Fact Sheet for Healthcare Providers:  SeriousBroker.it  This test is no                          t yet approved or cleared by the [place] FDA and  has been authorized for detection and/or diagnosis of SARS-CoV-2 by FDA under an Emergency Use Authorization (EUA). This EUA will remain  in effect (meaning this test can be used) for the duration of the COVID-19 declaration under Section 564(b)(1) of the Act, 21 U.S.C.section 360bbb-3(b)(1), unless the authorization is terminated  or revoked sooner.       Influenza A by PCR 06/07/2022 NEGATIVE  NEGATIVE Final   Influenza B by PCR 06/07/2022 NEGATIVE  NEGATIVE Final   Comment: (NOTE) The Xpert Xpress SARS-CoV-2/FLU/RSV plus assay is intended as an aid in the diagnosis of influenza from Nasopharyngeal swab specimens and should not be used as a sole basis for treatment. Nasal washings and aspirates are unacceptable for Xpert Xpress SARS-CoV-2/FLU/RSV testing.  Fact Sheet for Patients: BloggerCourse.com  Fact Sheet for Healthcare Providers: SeriousBroker.it  This test is not yet approved or cleared by the [place] FDA and has been authorized for detection and/or diagnosis of SARS-CoV-2 by FDA under an Emergency Use Authorization (EUA). This EUA will remain in effect (meaning this test can be used) for the duration of the COVID-19 declaration under Section 564(b)(1) of the Act, 21 U.S.C. section 360bbb-3(b)(1), unless the authorization is terminated or revoked.  Performed at [facility], [address], [place], [place] [address]    WBC 06/07/2022 5.7  4.0 -  10.5 K/uL Final   RBC 06/07/2022 4.39  3.87 - 5.11 MIL/uL Final   Hemoglobin 06/07/2022 13.2  12.0 - 15.0 g/dL Final   HCT 16/07/9603 40.4  36.0 - 46.0 % Final   MCV 06/07/2022 92.0  80.0 - 100.0 fL Final   MCH 06/07/2022 30.1  26.0 - 34.0 pg Final   MCHC 06/07/2022 32.7  30.0 - 36.0 g/dL Final   RDW 54/06/8118 12.4  11.5 - 15.5 % Final   Platelets 06/07/2022 308  150 - 400 K/uL Final   nRBC 06/07/2022 0.0  0.0 - 0.2 % Final   Neutrophils Relative % 06/07/2022 42  % Final   Neutro Abs 06/07/2022 2.4  1.7 - 7.7 K/uL Final   Lymphocytes Relative 06/07/2022 54  % Final   Lymphs Abs 06/07/2022 3.1  0.7 - 4.0 K/uL Final   Monocytes Relative 06/07/2022 4  % Final   Monocytes Absolute 06/07/2022 0.2  0.1 - 1.0 K/uL Final   Eosinophils Relative 06/07/2022 0  % Final   Eosinophils Absolute 06/07/2022 0.0  0.0 - 0.5 K/uL Final   Basophils Relative 06/07/2022 0  % Final   Basophils Absolute 06/07/2022 0.0  0.0 - 0.1 K/uL Final   Immature Granulocytes 06/07/2022 0  % Final   Abs Immature Granulocytes 06/07/2022 0.01  0.00 - 0.07 K/uL Final   Performed at [facility], [address], [place], [place] [address]   Sodium 06/07/2022 139  135 - 145 mmol/L Final   Potassium 06/07/2022 4.0  3.5 - 5.1 mmol/L Final   Chloride 06/07/2022 104  98 - 111 mmol/L Final   CO2 06/07/2022 28  22 - 32 mmol/L Final   Glucose, Bld 06/07/2022 104 (H)  70 - 99 mg/dL Final   Glucose reference range applies only to samples taken after fasting for at  least 8 hours.   BUN 06/07/2022 8  6 - 20 mg/dL Final   Creatinine, Ser 06/07/2022 0.84  0.44 - 1.00 mg/dL Final   Calcium 95/62/1308 9.1  8.9 - 10.3 mg/dL Final   Total Protein 65/78/4696 6.9  6.5 - 8.1 g/dL Final   Albumin 29/52/8413 3.7  3.5 - 5.0 g/dL Final   AST 24/40/1027 19  15 - 41 U/L Final   ALT 06/07/2022 22  0 - 44 U/L Final   Alkaline Phosphatase 06/07/2022 54  38 - 126 U/L Final   Total Bilirubin 06/07/2022 0.4  0.3 - 1.2 mg/dL Final   GFR, Estimated 06/07/2022 >60  >60 mL/min Final   Comment: (NOTE) Calculated using the CKD-EPI Creatinine Equation (2021)    Anion gap 06/07/2022 7  5 - 15 Final   Performed at [facility], [address], [place], [place] [address]   Hgb A1c MFr Bld 06/07/2022 5.0  4.8 - 5.6 % Final   Comment: (NOTE) Pre diabetes:          5.7%-6.4%  Diabetes:              >6.4%  Glycemic control for   <7.0% adults with diabetes    Mean Plasma Glucose 06/07/2022 96.8  mg/dL Final   Performed at [facility], [address] [place], [place] [address]   TSH 06/07/2022 1.620  0.350 - 4.500 uIU/mL Final   Comment: Performed by a 3rd Generation assay with a functional sensitivity of <=0.01 uIU/mL. Performed at [facility], [address] [place], [place] [address]    RPR Ser Ql 06/07/2022  NON REACTIVE  NON REACTIVE Final   Performed at [facility], [address], [place], [place] [address]   Color, Urine 06/07/2022 YELLOW  YELLOW Final   APPearance 06/07/2022 HAZY (A)  CLEAR Final   Specific Gravity, Urine 06/07/2022 1.018  1.005 - 1.030 Final   pH 06/07/2022 7.0  5.0 - 8.0 Final   Glucose, UA 06/07/2022 NEGATIVE  NEGATIVE mg/dL Final   Hgb urine dipstick 06/07/2022 NEGATIVE  NEGATIVE Final   Bilirubin Urine 06/07/2022 NEGATIVE  NEGATIVE Final   Ketones, ur 06/07/2022 NEGATIVE  NEGATIVE mg/dL Final   Protein, ur 27/12/5007 NEGATIVE  NEGATIVE mg/dL Final   Nitrite 38/18/2993 NEGATIVE  NEGATIVE Final   Leukocytes,Ua  06/07/2022 NEGATIVE  NEGATIVE Final   Performed at [facility], [address], [place], [place] [address]   Cholesterol 06/07/2022 164  0 - 200 mg/dL Final   Triglycerides 78/93/8101 158 (H)  <150 mg/dL Final   HDL 75/07/2584 44  >40 mg/dL Final   Total CHOL/HDL Ratio 06/07/2022 3.7  RATIO Final   VLDL 06/07/2022 32  0 - 40 mg/dL Final   LDL Cholesterol 06/07/2022 88  0 - 99 mg/dL Final   Comment:        Total Cholesterol/HDL:CHD Risk Coronary Heart Disease Risk Table                     Men   Women  1/2 Average Risk   3.4   3.3  Average Risk       5.0   4.4  2 X Average Risk   9.6   7.1  3 X Average Risk  23.4   11.0        Use the calculated Patient Ratio above and the CHD Risk Table to determine the patient's CHD Risk.        ATP III CLASSIFICATION (LDL):  <100     mg/dL   Optimal  277-824  mg/dL   Near or Above                    Optimal  130-159  mg/dL   Borderline  235-361  mg/dL   High  >443     mg/dL   Very High Performed at [facility], [address] [place], [place] [address]    HIV Screen 4th Generation wRfx 06/07/2022 Non Reactive  Non Reactive Final   Performed at [facility], [address] [place], [place] [address]   SARSCOV2ONAVIRUS 2 AG 06/07/2022 NEGATIVE  NEGATIVE Final   Comment: (NOTE) SARS-CoV-2 antigen NOT DETECTED.   Negative results are presumptive.  Negative results do not preclude SARS-CoV-2 infection and should not be used as the sole basis for treatment or other patient management decisions, including infection  control decisions, particularly in the presence of clinical signs and  symptoms consistent with COVID-19, or in those who have been in contact with the virus.  Negative results must be combined with clinical observations, patient history, and epidemiological information. The expected result is Negative.  Fact Sheet for Patients: https://www.jennings-kim.com/  Fact Sheet for Healthcare  Providers: https://alexander-rogers.biz/  This test is not yet approved or cleared by the [place] FDA and  has been authorized for detection and/or diagnosis of SARS-CoV-2 by FDA under an Emergency Use Authorization (EUA).  This EUA will remain in effect (meaning this test can be used) for the duration of  the COV  ID-19 declaration under Section 564(b)(1) of the Act, 21 U.S.C. section 360bbb-3(b)(1), unless the authorization is terminated or revoked sooner.     POC Amphetamine UR 06/07/2022 None Detected  NONE DETECTED (Cut Off Level 1000 ng/mL) Final   POC Secobarbital (BAR) 06/07/2022 None Detected  NONE DETECTED (Cut Off Level 300 ng/mL) Final   POC Buprenorphine (BUP) 06/07/2022 None Detected  NONE DETECTED (Cut Off Level 10 ng/mL) Final   POC Oxazepam (BZO) 06/07/2022 None Detected  NONE DETECTED (Cut Off Level 300 ng/mL) Final   POC Cocaine UR 06/07/2022 None Detected  NONE DETECTED (Cut Off Level 300 ng/mL) Final   POC Methamphetamine UR 06/07/2022 None Detected  NONE DETECTED (Cut Off Level 1000 ng/mL) Final   POC Morphine 06/07/2022 None Detected  NONE DETECTED (Cut Off Level 300 ng/mL) Final   POC Methadone UR 06/07/2022 None Detected  NONE DETECTED (Cut Off Level 300 ng/mL) Final   POC Oxycodone UR 06/07/2022 None Detected  NONE DETECTED (Cut Off Level 100 ng/mL) Final   POC Marijuana UR 06/07/2022 None Detected  NONE DETECTED (Cut Off Level 50 ng/mL) Final   Preg Test, Ur 06/08/2022 NEGATIVE  NEGATIVE Final   Comment:        THE SENSITIVITY OF THIS METHODOLOGY IS >20 mIU/mL. Performed at [facility], [address], [place], [place] [address]    Preg Test, Ur 06/08/2022 NEGATIVE  NEGATIVE Final   Comment:        THE SENSITIVITY OF THIS METHODOLOGY IS >24 mIU/mL     Blood Alcohol level:  Lab Results  Component Value Date   ETH <10 07/04/2022   ETH <10 02/07/2022    Metabolic Disorder Labs: Lab Results  Component  Value Date   HGBA1C 5.0 06/07/2022   MPG 96.8 06/07/2022   MPG 96.8 02/07/2022   No results found for: "PROLACTIN" Lab Results  Component Value Date   CHOL 181 07/25/2022   TRIG 118 07/25/2022   HDL 45 07/25/2022   CHOLHDL 4.0 07/25/2022   VLDL 24 07/25/2022   LDLCALC 112 (H) 07/25/2022   LDLCALC 88 06/07/2022    Therapeutic Lab Levels: No results found for: "LITHIUM" No results found for: "VALPROATE" No results found for: "CBMZ"  Physical Findings   GAD-7    Flowsheet Row [facility]/17/2021 [facility]  Total GAD-7 Score 17      PHQ2-9    Flowsheet Row ED from 06/07/2022 in [facility] Office Visit from 12/17/2021 in [facility]  PHQ-2 Total Score 1 2  PHQ-9 Total Score 2 8      Flowsheet Row ED from 07/25/2022 in [facility] ED from 07/04/2022 in [facility] from 06/14/2022 in [facility]   C-SSRS RISK CATEGORY No Risk No Risk No Risk        Musculoskeletal  Strength & Muscle Tone: within normal limits Gait & Station: normal Patient leans: N/A  Psychiatric Specialty Exam  Presentation  General Appearance:  Appropriate for Environment; Casual  Eye Contact: Good  Speech: Clear and Coherent; Normal Rate  Speech Volume: Normal  Handedness: Right   Mood and Affect  Mood: Euthymic  Affect: Congruent; Appropriate   Thought Process  Thought Processes: Goal Directed; Linear; Coherent  Descriptions of Associations:Intact  Orientation:Full (Time, Place and Person)  Thought Content:Logical  Diagnosis of Schizophrenia or Schizoaffective disorder in past:  No    Hallucinations:Hallucinations: None   Ideas of Reference:None  Suicidal Thoughts:Suicidal Thoughts: No   Homicidal Thoughts:Homicidal Thoughts: No    Sensorium  Memory: Immediate Good;  Recent Good; Remote Good  Judgment: Intact  Insight: Present   Executive Functions  Concentration: Fair  Attention Span: Fair  Recall: Fair  Fund of Knowledge: Fair  Language: Fair   Psychomotor Activity  Psychomotor Activity:Psychomotor Activity: Normal    Assets  Assets: Communication Skills; Social Support; Housing   Sleep  Sleep: Sleep: Good Number of Hours of Sleep: 8    No data recorded  Physical Exam  Physical Exam Constitutional:      General: She is not in acute distress.    Appearance: She is ill-appearing. She is not diaphoretic.  HENT:     Head: Normocephalic and atraumatic.  Eyes:     General: No scleral icterus. Pulmonary:     Effort: Prolonged expiration present.  Musculoskeletal:        General: Normal range of motion.  Skin:    General: Skin is warm and dry.  Neurological:     Mental Status: She is alert and oriented to person, place, and time.  Psychiatric:        Attention and Perception: Attention and perception normal.        Mood and Affect: Mood and affect normal.        Speech: Speech normal.        Behavior: Behavior normal. Behavior is cooperative.        Thought Content: Thought content normal.        Judgment: Judgment normal.    Review of Systems  Constitutional:  Negative for chills and fever.  Respiratory:  Negative for shortness of breath.   Cardiovascular:  Negative for chest pain and palpitations.  Gastrointestinal:  Negative for abdominal pain.  Genitourinary:  Positive for dysuria, flank pain and urgency.   Blood pressure 113/88, pulse (!) 104, temperature 98.1 F (36.7 C), resp. rate 16, SpO2 98 %. There is no height or weight on file to calculate BMI.  Treatment Plan Summary: Plan Pt remains psychiatrically cleared. [facility] until medically cleared for worsening GU symptoms and flank pain. When she returns she is awaiting placement in residential facility.  Lorri FrederickMargely Carrion-Carrero,  MD 10/07/2022 10:35 AM

## 2022-10-07 NOTE — ED Notes (Signed)
Pt returned from [facility] ED.   ABT started for UTI. Resp even and unlabored. No noted distress. Will continue to monitor for safety

## 2022-10-07 NOTE — Discharge Instructions (Signed)
Take antibiotics for the full course, Tylenol and ibuprofen as discussed below  Please use Tylenol or ibuprofen for pain.  You may use 600 mg ibuprofen every 6 hours or 1000 mg of Tylenol every 6 hours.  You may choose to alternate between the 2.  This would be most effective.  Not to exceed 4 g of Tylenol within 24 hours.  Not to exceed 3200 mg ibuprofen 24 hours.   Return to the emergency room for any new or concerning symptoms.

## 2022-10-08 LAB — URINE CULTURE

## 2022-10-08 MED ORDER — NITROFURANTOIN MONOHYD MACRO 100 MG PO CAPS
100.0000 mg | ORAL_CAPSULE | Freq: Two times a day (BID) | ORAL | Status: AC
  Administered 2022-10-08 – 2022-10-13 (×12): 100 mg via ORAL
  Filled 2022-10-08 (×12): qty 1

## 2022-10-08 MED ORDER — HYDROXYZINE HCL 25 MG PO TABS
25.0000 mg | ORAL_TABLET | Freq: Three times a day (TID) | ORAL | Status: DC | PRN
  Administered 2022-10-08 – 2022-11-22 (×28): 25 mg via ORAL
  Filled 2022-10-08 (×28): qty 1

## 2022-10-08 MED ORDER — PANTOPRAZOLE SODIUM 40 MG PO TBEC
40.0000 mg | DELAYED_RELEASE_TABLET | Freq: Every day | ORAL | Status: DC
  Administered 2022-10-08 – 2022-11-23 (×47): 40 mg via ORAL
  Filled 2022-10-08 (×5): qty 1
  Filled 2022-10-08: qty 7
  Filled 2022-10-08 (×42): qty 1

## 2022-10-08 MED ORDER — NICOTINE 14 MG/24HR TD PT24: 14.0000 mg | MEDICATED_PATCH | Freq: Every day | TRANSDERMAL | Status: DC | PRN

## 2022-10-08 MED ORDER — LEVOTHYROXINE SODIUM 100 MCG PO TABS
100.0000 ug | ORAL_TABLET | Freq: Every day | ORAL | Status: DC
  Administered 2022-10-08 – 2022-11-23 (×47): 100 ug via ORAL
  Filled 2022-10-08 (×40): qty 1
  Filled 2022-10-08: qty 7
  Filled 2022-10-08 (×6): qty 1

## 2022-10-08 MED ORDER — METFORMIN HCL 500 MG PO TABS
500.0000 mg | ORAL_TABLET | Freq: Every day | ORAL | Status: DC
  Administered 2022-10-09 – 2022-11-23 (×45): 500 mg via ORAL
  Filled 2022-10-08 (×29): qty 1
  Filled 2022-10-08: qty 7
  Filled 2022-10-08 (×17): qty 1

## 2022-10-08 MED ORDER — SERTRALINE HCL 50 MG PO TABS
150.0000 mg | ORAL_TABLET | Freq: Every day | ORAL | Status: DC
  Administered 2022-10-08 – 2022-10-17 (×10): 150 mg via ORAL
  Filled 2022-10-08 (×10): qty 1

## 2022-10-08 MED ORDER — ONDANSETRON 4 MG PO TBDP
4.0000 mg | ORAL_TABLET | Freq: Three times a day (TID) | ORAL | Status: DC | PRN
  Administered 2022-10-09 – 2022-11-16 (×10): 4 mg via ORAL
  Filled 2022-10-08 (×10): qty 1

## 2022-10-08 MED ORDER — VALACYCLOVIR HCL 500 MG PO TABS
500.0000 mg | ORAL_TABLET | Freq: Every day | ORAL | Status: DC
  Administered 2022-10-08 – 2022-11-23 (×47): 500 mg via ORAL
  Filled 2022-10-08 (×5): qty 1
  Filled 2022-10-08: qty 7
  Filled 2022-10-08 (×42): qty 1

## 2022-10-08 MED ORDER — DOCUSATE SODIUM 100 MG PO CAPS
100.0000 mg | ORAL_CAPSULE | Freq: Every day | ORAL | Status: DC
  Administered 2022-10-08 – 2022-10-14 (×7): 100 mg via ORAL
  Filled 2022-10-08 (×7): qty 1

## 2022-10-08 MED ORDER — LORATADINE 10 MG PO TABS
10.0000 mg | ORAL_TABLET | Freq: Every day | ORAL | Status: DC
  Administered 2022-10-08 – 2022-11-23 (×47): 10 mg via ORAL
  Filled 2022-10-08 (×42): qty 1
  Filled 2022-10-08: qty 7
  Filled 2022-10-08 (×5): qty 1

## 2022-10-08 NOTE — ED Notes (Signed)
Patient resting on bed, complaint with medication. Rise and fall of chest noted. No acute concerns at this time.

## 2022-10-08 NOTE — ED Notes (Signed)
Pt A&O x 4, no distress noted, calm & cooperative.  Monitoring for safety. 

## 2022-10-08 NOTE — ED Provider Notes (Signed)
Behavioral Health Progress Note  Date and Time: 10/08/2022 1:26 PM Name: Nicole [name] MRN:  [id number]  Subjective:    Nicole [name] is a 31 y.o. female, with PMH of SCZ unspecified, MDD with psychosis, who presented voluntary to [facility] (07/25/2022) via [facility] after a verbal altercation with [name] (not patient's legal guardian) for transient SI. This is patient's fourth visit to [facility] for similar concerns this year. Patient has been dismissed from previous group home due to threatening SI and HI, then leaving the group home.     Patient evaluated on the unit, reports adequate sleep and appetite. Reports her GU symptoms are stable, denies flank pain. Encouraged patient to increase oral hydration as she complains her urine has been dark.   She denies suicidal ideation, denies homicidal ideation. She denies auditory and visual hallucinations. She denies any paranoid ideations. She denies thought insertion, thought withdrawal, and ideas of reference.   She denies any side effects to currently prescribed psychiatric medications.    Diagnosis:  Final diagnoses:  None    Total Time spent with patient: 15 minutes  Past Psychiatric History: see HPI Past Medical History:  Past Medical History:  Diagnosis Date   Anxiety    Depression    Hypothyroidism 08/07/2022   Tobacco use disorder 08/07/2022    Past Surgical History:  Procedure Laterality Date   WISDOM TOOTH EXTRACTION Bilateral 2020   Family History:  Family History  Problem Relation Age of Onset   Hypertension Father    Diabetes Father    Family Psychiatric  History: None reported Social History:  Social History   Substance and Sexual Activity  Alcohol Use Never     Social History   Substance and Sexual Activity  Drug Use Never    Social History   Socioeconomic History   Marital status: Single    Spouse name: Not on file   Number of children: Not on file   Years of education: Not on file   Highest  education level: Not on file  Occupational History   Not on file  Tobacco Use   Smoking status: Every Day    Types: Cigarettes   Smokeless tobacco: Never  Vaping Use   Vaping Use: Some days  Substance and Sexual Activity   Alcohol use: Never   Drug use: Never   Sexual activity: Yes    Partners: Female    Birth control/protection: Implant  Other Topics Concern   Not on file  Social History Narrative   Not on file   Social Determinants of Health   Financial Resource Strain: Not on file  Food Insecurity: Not on file  Transportation Needs: Not on file  Physical Activity: Not on file  Stress: Not on file  Social Connections: Not on file   SDOH:  SDOH Screenings   Depression (PHQ2-9): Low Risk  (06/10/2022)  Tobacco Use: High Risk (10/07/2022)   Additional Social History:                         Sleep: Good  Appetite:  Good  Current Medications:  Current Facility-Administered Medications  Medication Dose Route Frequency Provider Last Rate Last Admin   acetaminophen (TYLENOL) tablet 650 mg  650 mg Oral Q6H PRN Marlou Sa, NP       alum & mag hydroxide-simeth (MAALOX/MYLANTA) 200-200-20 MG/5ML suspension 30 mL  30 mL Oral Q4H PRN Marlou Sa, NP   30 mL at 10/07/22 2303  docusate sodium (COLACE) capsule 100 mg  100 mg Oral Daily Carrion-Carrero, Moishe Schellenberg, MD   100 mg at 10/08/22 1240   hydrOXYzine (ATARAX) tablet 25 mg  25 mg Oral TID PRN Carrion-Carrero, Karle Starch, MD       levothyroxine (SYNTHROID) tablet 100 mcg  100 mcg Oral Once Byungura, Veronique M, NP       levothyroxine (SYNTHROID) tablet 100 mcg  100 mcg Oral Q0600 Marlou Sa, NP   100 mcg at 10/08/22 0654   loratadine (CLARITIN) tablet 10 mg  10 mg Oral Daily Carrion-Carrero, Karle Starch, MD   10 mg at 10/08/22 1240   magnesium hydroxide (MILK OF MAGNESIA) suspension 30 mL  30 mL Oral Daily PRN Marlou Sa, NP       [START ON 10/09/2022] metFORMIN (GLUCOPHAGE) tablet  500 mg  500 mg Oral Q breakfast Carrion-Carrero, Preethi Scantlebury, MD       nicotine (NICODERM CQ - dosed in mg/24 hours) patch 14 mg  14 mg Transdermal Daily PRN Carrion-Carrero, Washington Whedbee, MD       nitrofurantoin (macrocrystal-monohydrate) (MACROBID) capsule 100 mg  100 mg Oral Q12H Rayburn Go, Veronique M, NP   100 mg at 10/08/22 0956   ondansetron (ZOFRAN-ODT) disintegrating tablet 4 mg  4 mg Oral Q8H PRN Carrion-Carrero, Salvator Seppala, MD       Oxcarbazepine (TRILEPTAL) tablet 300 mg  300 mg Oral BID Byungura, Veronique M, NP   300 mg at 10/08/22 0956   pantoprazole (PROTONIX) EC tablet 40 mg  40 mg Oral Daily Carrion-Carrero, Jalayna Josten, MD   40 mg at 10/08/22 1239   QUEtiapine (SEROQUEL) tablet 400 mg  400 mg Oral BID Rayburn Go, Veronique M, NP   400 mg at 10/08/22 0109   sertraline (ZOLOFT) tablet 150 mg  150 mg Oral Daily Carrion-Carrero, Teague Goynes, MD   150 mg at 10/08/22 1239   traZODone (DESYREL) tablet 100 mg  100 mg Oral QHS Olin Pia M, NP   100 mg at 10/07/22 2204   traZODone (DESYREL) tablet 50 mg  50 mg Oral QHS PRN Marlou Sa, NP       valACYclovir (VALTREX) tablet 500 mg  500 mg Oral Daily Carrion-Carrero, Krystalynn Ridgeway, MD   500 mg at 10/08/22 1240   Current Outpatient Medications  Medication Sig Dispense Refill   ABILIFY MAINTENA 400 MG PRSY prefilled syringe 400 mg every 28 (twenty-eight) days.     cetirizine (ZYRTEC) 10 MG tablet Take 10 mg by mouth daily.     cyclobenzaprine (FLEXERIL) 10 MG tablet Take 1 tablet (10 mg total) by mouth 2 (two) times daily as needed for muscle spasms. 20 tablet 0   fluticasone (FLONASE) 50 MCG/ACT nasal spray Place 1 spray into both nostrils daily.     meloxicam (MOBIC) 15 MG tablet Take 15 mg by mouth daily.     nitrofurantoin, macrocrystal-monohydrate, (MACROBID) 100 MG capsule Take 1 capsule (100 mg total) by mouth 2 (two) times daily. 10 capsule 0   ondansetron (ZOFRAN-ODT) 4 MG disintegrating tablet Take 1 tablet (4 mg total) by mouth every 8  (eight) hours as needed for nausea or vomiting. 20 tablet 0   Oxcarbazepine (TRILEPTAL) 300 MG tablet Take 1 tablet (300 mg total) by mouth 2 (two) times daily. 60 tablet 0   QUEtiapine (SEROQUEL) 400 MG tablet Take 1 tablet (400 mg total) by mouth 2 (two) times daily. 60 tablet 0   sertraline (ZOLOFT) 50 MG tablet Take 3 tablets (150 mg total) by mouth in the morning. 90 tablet  0   traZODone (DESYREL) 100 MG tablet Take 1 tablet (100 mg total) by mouth at bedtime. 30 tablet 0   valACYclovir (VALTREX) 500 MG tablet Take 500 mg by mouth daily.      Labs  Lab Results:  Admission on 10/07/2022, Discharged on 10/07/2022  Component Date Value Ref Range Status   Color, Urine 10/07/2022 YELLOW  YELLOW Final   APPearance 10/07/2022 HAZY (A)  CLEAR Final   Specific Gravity, Urine 10/07/2022 1.011  1.005 - 1.030 Final   pH 10/07/2022 7.0  5.0 - 8.0 Final   Glucose, UA 10/07/2022 NEGATIVE  NEGATIVE mg/dL Final   Hgb urine dipstick 10/07/2022 SMALL (A)  NEGATIVE Final   Bilirubin Urine 10/07/2022 NEGATIVE  NEGATIVE Final   Ketones, ur 10/07/2022 NEGATIVE  NEGATIVE mg/dL Final   Protein, ur 16/07/9603 NEGATIVE  NEGATIVE mg/dL Final   Nitrite 54/06/8118 NEGATIVE  NEGATIVE Final   Leukocytes,Ua 10/07/2022 LARGE (A)  NEGATIVE Final   RBC / HPF 10/07/2022 0-5  0 - 5 RBC/hpf Final   WBC, UA 10/07/2022 0-5  0 - 5 WBC/hpf Final   Bacteria, UA 10/07/2022 FEW (A)  NONE SEEN Final   Squamous Epithelial / LPF 10/07/2022 11-20  0 - 5 Final   Performed at [facility], [address] [place] [address] [place], [place] [address]   I-stat hCG, quantitative 10/07/2022 <5.0  <5 mIU/mL Final   Comment 3 10/07/2022          Final   Comment:   GEST. AGE      CONC.  (mIU/mL)   <=1 WEEK        5 - 50     2 WEEKS       50 - 500     3 WEEKS       100 - 10,000     4 WEEKS     1,000 - 30,000        FEMALE AND NON-PREGNANT FEMALE:     LESS THAN 5 mIU/mL    Sodium 10/07/2022 138  135 - 145 mmol/L Final   Potassium 10/07/2022 4.3   3.5 - 5.1 mmol/L Final   Chloride 10/07/2022 104  98 - 111 mmol/L Final   CO2 10/07/2022 28  22 - 32 mmol/L Final   Glucose, Bld 10/07/2022 90  70 - 99 mg/dL Final   Glucose reference range applies only to samples taken after fasting for at least 8 hours.   BUN 10/07/2022 8  6 - 20 mg/dL Final   Creatinine, Ser 10/07/2022 0.96  0.44 - 1.00 mg/dL Final   Calcium 95/62/1308 9.1  8.9 - 10.3 mg/dL Final   GFR, Estimated 10/07/2022 >60  >60 mL/min Final   Comment: (NOTE) Calculated using the CKD-EPI Creatinine Equation (2021)    Anion gap 10/07/2022 6  5 - 15 Final   Performed at [facility], [address], [place], [place] [address]   WBC 10/07/2022 5.8  4.0 - 10.5 K/uL Final   RBC 10/07/2022 4.36  3.87 - 5.11 MIL/uL Final   Hemoglobin 10/07/2022 12.9  12.0 - 15.0 g/dL Final   HCT 69/62/9528 40.0  36.0 - 46.0 % Final   MCV 10/07/2022 91.7  80.0 - 100.0 fL Final   MCH 10/07/2022 29.6  26.0 - 34.0 pg Final   MCHC 10/07/2022 32.3  30.0 - 36.0 g/dL Final   RDW 41/32/4401 12.4  11.5 - 15.5 % Final   Platelets 10/07/2022 295  150 - 400 K/uL  Final   nRBC 10/07/2022 0.0  0.0 - 0.2 % Final   Performed at [facility], [address], [place], [place] [address]   SARS Coronavirus 2 by RT PCR 10/07/2022 NEGATIVE  NEGATIVE Final   Comment: (NOTE) SARS-CoV-2 target nucleic acids are NOT DETECTED.  The SARS-CoV-2 RNA is generally detectable in upper respiratory specimens during the acute phase of infection. The lowest concentration of SARS-CoV-2 viral copies this assay can detect is 138 copies/mL. A negative result does not preclude SARS-Cov-2 infection and should not be used as the sole basis for treatment or other patient management decisions. A negative result may occur with  improper specimen collection/handling, submission of specimen other than nasopharyngeal swab, presence of viral mutation(s) within the areas targeted by this assay, and inadequate number of viral copies(<138  copies/mL). A negative result must be combined with clinical observations, patient history, and epidemiological information. The expected result is Negative.  Fact Sheet for Patients:  BloggerCourse.com  Fact Sheet for Healthcare Providers:  SeriousBroker.it  This test is no                          t yet approved or cleared by the [place] FDA and  has been authorized for detection and/or diagnosis of SARS-CoV-2 by FDA under an Emergency Use Authorization (EUA). This EUA will remain  in effect (meaning this test can be used) for the duration of the COVID-19 declaration under Section 564(b)(1) of the Act, 21 U.S.C.section 360bbb-3(b)(1), unless the authorization is terminated  or revoked sooner.       Influenza A by PCR 10/07/2022 NEGATIVE  NEGATIVE Final   Influenza B by PCR 10/07/2022 NEGATIVE  NEGATIVE Final   Comment: (NOTE) The Xpert Xpress SARS-CoV-2/FLU/RSV plus assay is intended as an aid in the diagnosis of influenza from Nasopharyngeal swab specimens and should not be used as a sole basis for treatment. Nasal washings and aspirates are unacceptable for Xpert Xpress SARS-CoV-2/FLU/RSV testing.  Fact Sheet for Patients: BloggerCourse.com  Fact Sheet for Healthcare Providers: SeriousBroker.it  This test is not yet approved or cleared by the [place] FDA and has been authorized for detection and/or diagnosis of SARS-CoV-2 by FDA under an Emergency Use Authorization (EUA). This EUA will remain in effect (meaning this test can be used) for the duration of the COVID-19 declaration under Section 564(b)(1) of the Act, 21 U.S.C. section 360bbb-3(b)(1), unless the authorization is terminated or revoked.     Resp Syncytial Virus by PCR 10/07/2022 NEGATIVE  NEGATIVE Final   Comment: (NOTE) Fact Sheet for Patients: BloggerCourse.com  Fact  Sheet for Healthcare Providers: SeriousBroker.it  This test is not yet approved or cleared by the [place] FDA and has been authorized for detection and/or diagnosis of SARS-CoV-2 by FDA under an Emergency Use Authorization (EUA). This EUA will remain in effect (meaning this test can be used) for the duration of the COVID-19 declaration under Section 564(b)(1) of the Act, 21 U.S.C. section 360bbb-3(b)(1), unless the authorization is terminated or revoked.  Performed at [facility], [address] [place], [place] [address]    Specimen Description 10/07/2022 URINE, CLEAN CATCH   Final   Special Requests 10/07/2022    Final                   Value:NONE Performed at [facility], [address], [place], [place] [address]    Culture 10/07/2022 MULTIPLE SPECIES PRESENT,  SUGGEST RECOLLECTION (A)   Final   Report Status 10/07/2022 10/08/2022 FINAL   Final  Admission on 07/25/2022, Discharged on 10/07/2022  Component Date Value Ref Range Status   SARS Coronavirus 2 by RT PCR 07/25/2022 NEGATIVE  NEGATIVE Final   Comment: (NOTE) SARS-CoV-2 target nucleic acids are NOT DETECTED.  The SARS-CoV-2 RNA is generally detectable in upper respiratory specimens during the acute phase of infection. The lowest concentration of SARS-CoV-2 viral copies this assay can detect is 138 copies/mL. A negative result does not preclude SARS-Cov-2 infection and should not be used as the sole basis for treatment or other patient management decisions. A negative result may occur with  improper specimen collection/handling, submission of specimen other than nasopharyngeal swab, presence of viral mutation(s) within the areas targeted by this assay, and inadequate number of viral copies(<138 copies/mL). A negative result must be combined with clinical observations, patient history, and epidemiological information. The expected result is Negative.  Fact Sheet for  Patients:  BloggerCourse.com  Fact Sheet for Healthcare Providers:  SeriousBroker.it  This test is no                          t yet approved or cleared by the [place] FDA and  has been authorized for detection and/or diagnosis of SARS-CoV-2 by FDA under an Emergency Use Authorization (EUA). This EUA will remain  in effect (meaning this test can be used) for the duration of the COVID-19 declaration under Section 564(b)(1) of the Act, 21 U.S.C.section 360bbb-3(b)(1), unless the authorization is terminated  or revoked sooner.       Influenza A by PCR 07/25/2022 NEGATIVE  NEGATIVE Final   Influenza B by PCR 07/25/2022 NEGATIVE  NEGATIVE Final   Comment: (NOTE) The Xpert Xpress SARS-CoV-2/FLU/RSV plus assay is intended as an aid in the diagnosis of influenza from Nasopharyngeal swab specimens and should not be used as a sole basis for treatment. Nasal washings and aspirates are unacceptable for Xpert Xpress SARS-CoV-2/FLU/RSV testing.  Fact Sheet for Patients: BloggerCourse.com  Fact Sheet for Healthcare Providers: SeriousBroker.it  This test is not yet approved or cleared by the [place] FDA and has been authorized for detection and/or diagnosis of SARS-CoV-2 by FDA under an Emergency Use Authorization (EUA). This EUA will remain in effect (meaning this test can be used) for the duration of the COVID-19 declaration under Section 564(b)(1) of the Act, 21 U.S.C. section 360bbb-3(b)(1), unless the authorization is terminated or revoked.  Performed at [facility], [address], [place], [place] [address]    WBC 07/25/2022 8.3  4.0 - 10.5 K/uL Final   RBC 07/25/2022 4.44  3.87 - 5.11 MIL/uL Final   Hemoglobin 07/25/2022 13.7  12.0 - 15.0 g/dL Final   HCT 04/88/8916 40.2  36.0 - 46.0 % Final   MCV 07/25/2022 90.5  80.0 - 100.0 fL Final   MCH 07/25/2022 30.9   26.0 - 34.0 pg Final   MCHC 07/25/2022 34.1  30.0 - 36.0 g/dL Final   RDW 94/50/3888 12.2  11.5 - 15.5 % Final   Platelets 07/25/2022 248  150 - 400 K/uL Final   nRBC 07/25/2022 0.0  0.0 - 0.2 % Final   Neutrophils Relative % 07/25/2022 43  % Final   Neutro Abs 07/25/2022 3.6  1.7 - 7.7 K/uL Final   Lymphocytes Relative 07/25/2022 52  % Final   Lymphs Abs 07/25/2022 4.2 (H)  0.7 - 4.0 K/uL Final  Monocytes Relative 07/25/2022 5  % Final   Monocytes Absolute 07/25/2022 0.4  0.1 - 1.0 K/uL Final   Eosinophils Relative 07/25/2022 0  % Final   Eosinophils Absolute 07/25/2022 0.0  0.0 - 0.5 K/uL Final   Basophils Relative 07/25/2022 0  % Final   Basophils Absolute 07/25/2022 0.0  0.0 - 0.1 K/uL Final   Immature Granulocytes 07/25/2022 0  % Final   Abs Immature Granulocytes 07/25/2022 0.02  0.00 - 0.07 K/uL Final   Performed at [facility], [address] [place], [place] [address]   Sodium 07/25/2022 138  135 - 145 mmol/L Final   Potassium 07/25/2022 4.0  3.5 - 5.1 mmol/L Final   Chloride 07/25/2022 104  98 - 111 mmol/L Final   CO2 07/25/2022 29  22 - 32 mmol/L Final   Glucose, Bld 07/25/2022 83  70 - 99 mg/dL Final   Glucose reference range applies only to samples taken after fasting for at least 8 hours.   BUN 07/25/2022 11  6 - 20 mg/dL Final   Creatinine, Ser 07/25/2022 0.97  0.44 - 1.00 mg/dL Final   Calcium 60/45/4098 9.2  8.9 - 10.3 mg/dL Final   Total Protein 11/91/4782 7.0  6.5 - 8.1 g/dL Final   Albumin 95/62/1308 3.8  3.5 - 5.0 g/dL Final   AST 65/78/4696 18  15 - 41 U/L Final   ALT 07/25/2022 22  0 - 44 U/L Final   Alkaline Phosphatase 07/25/2022 64  38 - 126 U/L Final   Total Bilirubin 07/25/2022 0.2 (L)  0.3 - 1.2 mg/dL Final   GFR, Estimated 07/25/2022 >60  >60 mL/min Final   Comment: (NOTE) Calculated using the CKD-EPI Creatinine Equation (2021)    Anion gap 07/25/2022 5  5 - 15 Final   Performed at [facility], [address] [place], [place]  [address]   POC Amphetamine UR 07/25/2022 None Detected  NONE DETECTED (Cut Off Level 1000 ng/mL) Preliminary   POC Secobarbital (BAR) 07/25/2022 None Detected  NONE DETECTED (Cut Off Level 300 ng/mL) Preliminary   POC Buprenorphine (BUP) 07/25/2022 None Detected  NONE DETECTED (Cut Off Level 10 ng/mL) Preliminary   POC Oxazepam (BZO) 07/25/2022 None Detected  NONE DETECTED (Cut Off Level 300 ng/mL) Preliminary   POC Cocaine UR 07/25/2022 None Detected  NONE DETECTED (Cut Off Level 300 ng/mL) Preliminary   POC Methamphetamine UR 07/25/2022 None Detected  NONE DETECTED (Cut Off Level 1000 ng/mL) Preliminary   POC Morphine 07/25/2022 None Detected  NONE DETECTED (Cut Off Level 300 ng/mL) Preliminary   POC Methadone UR 07/25/2022 None Detected  NONE DETECTED (Cut Off Level 300 ng/mL) Preliminary   POC Oxycodone UR 07/25/2022 None Detected  NONE DETECTED (Cut Off Level 100 ng/mL) Preliminary   POC Marijuana UR 07/25/2022 None Detected  NONE DETECTED (Cut Off Level 50 ng/mL) Preliminary   SARSCOV2ONAVIRUS 2 AG 07/25/2022 NEGATIVE  NEGATIVE Final   Comment: (NOTE) SARS-CoV-2 antigen NOT DETECTED.   Negative results are presumptive.  Negative results do not preclude SARS-CoV-2 infection and should not be used as the sole basis for treatment or other patient management decisions, including infection  control decisions, particularly in the presence of clinical signs and  symptoms consistent with COVID-19, or in those who have been in contact with the virus.  Negative results must be combined with clinical observations, patient history, and epidemiological information. The expected result is Negative.  Fact Sheet for Patients: https://www.jennings-kim.com/  Fact Sheet for Healthcare  Providers: https://alexander-rogers.biz/  This test is not yet approved or cleared by the [place] and  has been authorized for detection and/or diagnosis of SARS-CoV-2 by FDA under an  Emergency Use Authorization (EUA).  This EUA will remain in effect (meaning this test can be used) for the duration of  the COV                          ID-19 declaration under Section 564(b)(1) of the Act, 21 U.S.C. section 360bbb-3(b)(1), unless the authorization is terminated or revoked sooner.     Cholesterol 07/25/2022 181  0 - 200 mg/dL Final   Triglycerides 16/07/9603 118  <150 mg/dL Final   HDL 54/06/8118 45  >40 mg/dL Final   Total CHOL/HDL Ratio 07/25/2022 4.0  RATIO Final   VLDL 07/25/2022 24  0 - 40 mg/dL Final   LDL Cholesterol 07/25/2022 112 (H)  0 - 99 mg/dL Final   Comment:        Total Cholesterol/HDL:CHD Risk Coronary Heart Disease Risk Table                     Men   Women  1/2 Average Risk   3.4   3.3  Average Risk       5.0   4.4  2 X Average Risk   9.6   7.1  3 X Average Risk  23.4   11.0        Use the calculated Patient Ratio above and the CHD Risk Table to determine the patient's CHD Risk.        ATP III CLASSIFICATION (LDL):  <100     mg/dL   Optimal  147-829  mg/dL   Near or Above                    Optimal  130-159  mg/dL   Borderline  562-130  mg/dL   High  >865     mg/dL   Very High Performed at [facility], [address] [place], [place] [address]    TSH 07/25/2022 6.668 (H)  0.350 - 4.500 uIU/mL Final   Comment: Performed by a 3rd Generation assay with a functional sensitivity of <=0.01 uIU/mL. Performed at [facility], [address], [place], [place] [address]    Glucose-Capillary 07/26/2022 104 (H)  70 - 99 mg/dL Final   Glucose reference range applies only to samples taken after fasting for at least 8 hours.   T3, Free 07/31/2022 2.3  2.0 - 4.4 pg/mL Final   Comment: (NOTE) Performed At: [facility] [address] [place], [place] [id number] Jolene Schimke MD UU:[id number]    Free T4 07/31/2022 0.60 (L)  0.61 - 1.12 ng/dL Final   Comment: (NOTE) Biotin ingestion may interfere with free T4 tests. If the  results are inconsistent with the TSH level, previous test results, or the clinical presentation, then consider biotin interference. If needed, order repeat testing after stopping biotin. Performed at [facility], [address], [place], [place] [address]    Glucose-Capillary 08/29/2022 100 (H)  70 - 99 mg/dL Final   Glucose reference range applies only to samples taken after fasting for at least 8 hours.   TSH 09/05/2022 0.793  0.350 - 4.500 uIU/mL Final   Comment: Performed by a 3rd Generation assay with a functional sensitivity of <=0.01 uIU/mL. Performed at [facility], [address], [place], [place] [address]    Free T4 09/05/2022 0.73  0.61 - 1.12 ng/dL Final   Comment: (NOTE) Biotin ingestion may interfere with free T4 tests. If the results are inconsistent with the TSH level, previous test results, or the clinical presentation, then consider biotin interference. If needed, order repeat testing after stopping biotin. Performed at [facility], [address] [place], [place] [address]    Preg Test, Ur 09/06/2022 Negative  Negative Final   Preg Test, Ur 09/06/2022 NEGATIVE  NEGATIVE Final   Comment:        THE SENSITIVITY OF THIS METHODOLOGY IS >24 mIU/mL    Preg Test, Ur 09/05/2022 NEGATIVE  NEGATIVE Final   Comment:        THE SENSITIVITY OF THIS METHODOLOGY IS >24 mIU/mL    Sodium 09/13/2022 136  135 - 145 mmol/L Final   Potassium 09/13/2022 4.5  3.5 - 5.1 mmol/L Final   Chloride 09/13/2022 105  98 - 111 mmol/L Final   CO2 09/13/2022 21 (L)  22 - 32 mmol/L Final   Glucose, Bld 09/13/2022 111 (H)  70 - 99 mg/dL Final   Glucose reference range applies only to samples taken after fasting for at least 8 hours.   BUN 09/13/2022 14  6 - 20 mg/dL Final   Creatinine, Ser 09/13/2022 0.92  0.44 - 1.00 mg/dL Final   Calcium 09/81/1914 9.1  8.9 - 10.3 mg/dL Final   GFR, Estimated 09/13/2022 >60  >60 mL/min Final   Comment: (NOTE) Calculated using the  CKD-EPI Creatinine Equation (2021)    Anion gap 09/13/2022 10  5 - 15 Final   Performed at [facility], [address], [place], [place] [address]   Vitamin B-12 09/13/2022 585  180 - 914 pg/mL Final   Comment: (NOTE) This assay is not validated for testing neonatal or myeloproliferative syndrome specimens for Vitamin B12 levels. Performed at [facility], [address], [place], [place] [address]    Color, Urine 09/14/2022 YELLOW  YELLOW Final   APPearance 09/14/2022 HAZY (A)  CLEAR Final   Specific Gravity, Urine 09/14/2022 1.025  1.005 - 1.030 Final   pH 09/14/2022 5.0  5.0 - 8.0 Final   Glucose, UA 09/14/2022 NEGATIVE  NEGATIVE mg/dL Final   Hgb urine dipstick 09/14/2022 NEGATIVE  NEGATIVE Final   Bilirubin Urine 09/14/2022 NEGATIVE  NEGATIVE Final   Ketones, ur 09/14/2022 NEGATIVE  NEGATIVE mg/dL Final   Protein, ur 86/57/8469 NEGATIVE  NEGATIVE mg/dL Final   Nitrite 62/95/2841 NEGATIVE  NEGATIVE Final   Leukocytes,Ua 09/14/2022 TRACE (A)  NEGATIVE Final   RBC / HPF 09/14/2022 0-5  0 - 5 RBC/hpf Final   WBC, UA 09/14/2022 0-5  0 - 5 WBC/hpf Final   Bacteria, UA 09/14/2022 RARE (A)  NONE SEEN Final   Squamous Epithelial / LPF 09/14/2022 0-5  0 - 5 Final   Mucus 09/14/2022 PRESENT   Final   Performed at [facility], [address], [place], [place] [address]   Glucose-Capillary 09/16/2022 105 (H)  70 - 99 mg/dL Final   Glucose reference range applies only to samples taken after fasting for at least 8 hours.   SARS Coronavirus 2 by RT PCR 09/30/2022 NEGATIVE  NEGATIVE Final   Comment: (NOTE) SARS-CoV-2 target nucleic acids are NOT DETECTED.  The SARS-CoV-2 RNA is generally detectable in upper respiratory specimens during the acute phase of infection. The lowest concentration of SARS-CoV-2 viral copies this  assay can detect is 138 copies/mL. A negative result does not preclude SARS-Cov-2 infection and should not be used as the sole basis for treatment  or other patient management decisions. A negative result may occur with  improper specimen collection/handling, submission of specimen other than nasopharyngeal swab, presence of viral mutation(s) within the areas targeted by this assay, and inadequate number of viral copies(<138 copies/mL). A negative result must be combined with clinical observations, patient history, and epidemiological information. The expected result is Negative.  Fact Sheet for Patients:  BloggerCourse.com  Fact Sheet for Healthcare Providers:  SeriousBroker.it  This test is no                          t yet approved or cleared by the [place] FDA and  has been authorized for detection and/or diagnosis of SARS-CoV-2 by FDA under an Emergency Use Authorization (EUA). This EUA will remain  in effect (meaning this test can be used) for the duration of the COVID-19 declaration under Section 564(b)(1) of the Act, 21 U.S.C.section 360bbb-3(b)(1), unless the authorization is terminated  or revoked sooner.       Influenza A by PCR 09/30/2022 NEGATIVE  NEGATIVE Final   Influenza B by PCR 09/30/2022 NEGATIVE  NEGATIVE Final   Comment: (NOTE) The Xpert Xpress SARS-CoV-2/FLU/RSV plus assay is intended as an aid in the diagnosis of influenza from Nasopharyngeal swab specimens and should not be used as a sole basis for treatment. Nasal washings and aspirates are unacceptable for Xpert Xpress SARS-CoV-2/FLU/RSV testing.  Fact Sheet for Patients: BloggerCourse.com  Fact Sheet for Healthcare Providers: SeriousBroker.it  This test is not yet approved or cleared by the [place] FDA and has been authorized for detection and/or diagnosis of SARS-CoV-2 by FDA under an Emergency Use Authorization (EUA). This EUA will remain in effect (meaning this test can be used) for the duration of the COVID-19 declaration under  Section 564(b)(1) of the Act, 21 U.S.C. section 360bbb-3(b)(1), unless the authorization is terminated or revoked.     Resp Syncytial Virus by PCR 09/30/2022 NEGATIVE  NEGATIVE Final   Comment: (NOTE) Fact Sheet for Patients: BloggerCourse.com  Fact Sheet for Healthcare Providers: SeriousBroker.it  This test is not yet approved or cleared by the [place] FDA and has been authorized for detection and/or diagnosis of SARS-CoV-2 by FDA under an Emergency Use Authorization (EUA). This EUA will remain in effect (meaning this test can be used) for the duration of the COVID-19 declaration under Section 564(b)(1) of the Act, 21 U.S.C. section 360bbb-3(b)(1), unless the authorization is terminated or revoked.  Performed at [facility], [address] [place], [place] [address]    Sodium 10/01/2022 136  135 - 145 mmol/L Final   Potassium 10/01/2022 3.8  3.5 - 5.1 mmol/L Final   Chloride 10/01/2022 104  98 - 111 mmol/L Final   CO2 10/01/2022 27  22 - 32 mmol/L Final   Glucose, Bld 10/01/2022 98  70 - 99 mg/dL Final   Glucose reference range applies only to samples taken after fasting for at least 8 hours.   BUN 10/01/2022 12  6 - 20 mg/dL Final   Creatinine, Ser 10/01/2022 0.98  0.44 - 1.00 mg/dL Final   Calcium 60/45/4098 8.9  8.9 - 10.3 mg/dL Final   GFR, Estimated 10/01/2022 >60  >60 mL/min Final   Comment: (NOTE) Calculated using the CKD-EPI Creatinine Equation (2021)    Anion gap 10/01/2022 5  5 - 15 Final   Performed at [facility], [address] [place], [place] [address]   WBC 10/01/2022 5.1  4.0 - 10.5 K/uL Final   RBC 10/01/2022 4.31  3.87 - 5.11 MIL/uL Final   Hemoglobin 10/01/2022 12.7  12.0 - 15.0 g/dL Final   HCT 60/45/4098 38.8  36.0 - 46.0 % Final   MCV 10/01/2022 90.0  80.0 - 100.0 fL Final   MCH 10/01/2022 29.5  26.0 - 34.0 pg Final   MCHC 10/01/2022 32.7  30.0 - 36.0 g/dL Final   RDW  11/91/4782 12.4  11.5 - 15.5 % Final   Platelets 10/01/2022 300  150 - 400 K/uL Final   nRBC 10/01/2022 0.0  0.0 - 0.2 % Final   Performed at [facility], [address], [place], [place] [address]   Lipase 10/01/2022 29  11 - 51 U/L Final   Performed at [facility], [address], [place], [place] [address]   Color, Urine 10/05/2022 YELLOW  YELLOW Final   APPearance 10/05/2022 HAZY (A)  CLEAR Final   Specific Gravity, Urine 10/05/2022 1.018  1.005 - 1.030 Final   pH 10/05/2022 5.0  5.0 - 8.0 Final   Glucose, UA 10/05/2022 NEGATIVE  NEGATIVE mg/dL Final   Hgb urine dipstick 10/05/2022 NEGATIVE  NEGATIVE Final   Bilirubin Urine 10/05/2022 NEGATIVE  NEGATIVE Final   Ketones, ur 10/05/2022 NEGATIVE  NEGATIVE mg/dL Final   Protein, ur 78/46/9629 NEGATIVE  NEGATIVE mg/dL Final   Nitrite 52/84/1324 NEGATIVE  NEGATIVE Final   Leukocytes,Ua 10/05/2022 TRACE (A)  NEGATIVE Final   RBC / HPF 10/05/2022 0-5  0 - 5 RBC/hpf Final   WBC, UA 10/05/2022 0-5  0 - 5 WBC/hpf Final   Bacteria, UA 10/05/2022 RARE (A)  NONE SEEN Final   Squamous Epithelial / LPF 10/05/2022 0-5  0 - 5 Final   Mucus 10/05/2022 PRESENT   Final   Performed at [facility], [address] [place], [place] [address]   Specimen Description 10/05/2022 URINE, CLEAN CATCH   Final   Special Requests 10/05/2022    Final                   Value:NONE Performed at [facility], [address] [place], [place] [address]    Culture 10/05/2022 10,000 COLONIES/mL MULTIPLE SPECIES PRESENT, SUGGEST RECOLLECTION (A)   Final   Report Status 10/05/2022 10/07/2022 FINAL   Final  Admission on 07/04/2022, Discharged on 07/05/2022  Component Date Value Ref Range Status   Sodium 07/04/2022 142  135 - 145 mmol/L Final   Potassium 07/04/2022 4.4  3.5 - 5.1 mmol/L Final   Chloride 07/04/2022 109  98 - 111 mmol/L Final   CO2 07/04/2022 26  22 - 32 mmol/L Final   Glucose, Bld 07/04/2022 96  70 - 99 mg/dL Final   Glucose  reference range applies only to samples taken after fasting for at least 8 hours.   BUN 07/04/2022 15  6 - 20 mg/dL Final   Creatinine, Ser 07/04/2022 0.83  0.44 - 1.00 mg/dL Final   Calcium 64/40/3474 9.3  8.9 - 10.3 mg/dL Final   Total Protein 25/95/6387 7.2  6.5 - 8.1 g/dL Final   Albumin 56/43/3295 3.9  3.5 - 5.0 g/dL Final   AST 18/84/1660 23  15 - 41 U/L Final   ALT 07/04/2022 28  0 - 44 U/L Final   Alkaline Phosphatase 07/04/2022 77  38 - 126 U/L Final   Total Bilirubin 07/04/2022 0.4  0.3 - 1.2 mg/dL Final   GFR, Estimated 07/04/2022 >60  >60 mL/min Final   Comment: (NOTE) Calculated using the CKD-EPI Creatinine Equation (2021)    Anion gap 07/04/2022 7  5 - 15 Final   Performed at [facility], [address], [place], [place] [address]   Alcohol, Ethyl (B) 07/04/2022 <10  <10 mg/dL Final   Comment: (NOTE) Lowest detectable limit for serum alcohol is 10 mg/dL.  For medical purposes only. Performed at [facility], [address], [place], [place] [address]    WBC 07/04/2022 7.0  4.0 - 10.5 K/uL Final   RBC 07/04/2022 4.18  3.87 - 5.11 MIL/uL Final   Hemoglobin 07/04/2022 12.8  12.0 - 15.0 g/dL Final   HCT 09/81/1914 39.1  36.0 - 46.0 % Final   MCV 07/04/2022 93.5  80.0 - 100.0 fL Final   MCH 07/04/2022 30.6  26.0 - 34.0 pg Final   MCHC 07/04/2022 32.7  30.0 - 36.0 g/dL Final   RDW 78/29/5621 12.9  11.5 - 15.5 % Final   Platelets 07/04/2022 243  150 - 400 K/uL Final   nRBC 07/04/2022 0.0  0.0 - 0.2 % Final   Neutrophils Relative % 07/04/2022 43  % Final   Neutro Abs 07/04/2022 3.0  1.7 - 7.7 K/uL Final   Lymphocytes Relative 07/04/2022 50  % Final   Lymphs Abs 07/04/2022 3.5  0.7 - 4.0 K/uL Final   Monocytes Relative 07/04/2022 7  % Final   Monocytes Absolute 07/04/2022 0.5  0.1 - 1.0 K/uL Final   Eosinophils Relative 07/04/2022 0  % Final   Eosinophils Absolute 07/04/2022 0.0  0.0 - 0.5 K/uL Final   Basophils Relative 07/04/2022  0  % Final   Basophils Absolute 07/04/2022 0.0  0.0 - 0.1 K/uL Final   Immature Granulocytes 07/04/2022 0  % Final   Abs Immature Granulocytes 07/04/2022 0.01  0.00 - 0.07 K/uL Final   Performed at [facility], [address], [place], [place] [address]   I-stat hCG, quantitative 07/04/2022 <5.0  <5 mIU/mL Final   Comment 3 07/04/2022          Final   Comment:   GEST. AGE      CONC.  (mIU/mL)   <=1 WEEK        5 - 50     2 WEEKS       50 - 500     3 WEEKS       100 - 10,000     4 WEEKS     1,000 - 30,000        FEMALE AND NON-PREGNANT FEMALE:     LESS THAN 5 mIU/mL   Admission on 06/14/2022, Discharged on 06/14/2022  Component Date Value Ref Range Status   Color, UA 06/14/2022 yellow  yellow Final   Clarity, UA 06/14/2022 cloudy (A)  clear Final   Glucose, UA 06/14/2022 negative  negative mg/dL Final   Bilirubin, UA 78/46/9629 negative  negative Final   Ketones, POC UA 06/14/2022 negative  negative mg/dL Final   Spec Grav, UA 52/84/1324 1.025  1.010 - 1.025 Final   Blood, UA 06/14/2022 negative  negative Final   pH, UA 06/14/2022 7.5  5.0 - 8.0 Final   Protein Ur, POC 06/14/2022 =30 (A)  negative mg/dL Final   Urobilinogen, UA 06/14/2022 0.2  0.2 or 1.0 E.U./dL Final  Nitrite, UA 06/14/2022 Negative  Negative Final   Leukocytes, UA 06/14/2022 Negative  Negative Final   Preg Test, Ur 06/14/2022 Negative  Negative Final   Specimen Description 06/14/2022 URINE, CLEAN CATCH   Final   Special Requests 06/14/2022    Final                   Value:NONE Performed at [facility], [address] [place], [place] [address]    Culture 06/14/2022 MULTIPLE SPECIES PRESENT, SUGGEST RECOLLECTION (A)   Final   Report Status 06/14/2022 06/16/2022 FINAL   Final  Admission on 06/07/2022, Discharged on 06/10/2022  Component Date Value Ref Range Status   SARS Coronavirus 2 by RT PCR 06/07/2022 NEGATIVE  NEGATIVE Final   Comment: (NOTE) SARS-CoV-2 target nucleic acids are  NOT DETECTED.  The SARS-CoV-2 RNA is generally detectable in upper respiratory specimens during the acute phase of infection. The lowest concentration of SARS-CoV-2 viral copies this assay can detect is 138 copies/mL. A negative result does not preclude SARS-Cov-2 infection and should not be used as the sole basis for treatment or other patient management decisions. A negative result may occur with  improper specimen collection/handling, submission of specimen other than nasopharyngeal swab, presence of viral mutation(s) within the areas targeted by this assay, and inadequate number of viral copies(<138 copies/mL). A negative result must be combined with clinical observations, patient history, and epidemiological information. The expected result is Negative.  Fact Sheet for Patients:  BloggerCourse.com  Fact Sheet for Healthcare Providers:  SeriousBroker.it  This test is no                          t yet approved or cleared by the [place] FDA and  has been authorized for detection and/or diagnosis of SARS-CoV-2 by FDA under an Emergency Use Authorization (EUA). This EUA will remain  in effect (meaning this test can be used) for the duration of the COVID-19 declaration under Section 564(b)(1) of the Act, 21 U.S.C.section 360bbb-3(b)(1), unless the authorization is terminated  or revoked sooner.       Influenza A by PCR 06/07/2022 NEGATIVE  NEGATIVE Final   Influenza B by PCR 06/07/2022 NEGATIVE  NEGATIVE Final   Comment: (NOTE) The Xpert Xpress SARS-CoV-2/FLU/RSV plus assay is intended as an aid in the diagnosis of influenza from Nasopharyngeal swab specimens and should not be used as a sole basis for treatment. Nasal washings and aspirates are unacceptable for Xpert Xpress SARS-CoV-2/FLU/RSV testing.  Fact Sheet for Patients: BloggerCourse.com  Fact Sheet for Healthcare  Providers: SeriousBroker.it  This test is not yet approved or cleared by the [place] FDA and has been authorized for detection and/or diagnosis of SARS-CoV-2 by FDA under an Emergency Use Authorization (EUA). This EUA will remain in effect (meaning this test can be used) for the duration of the COVID-19 declaration under Section 564(b)(1) of the Act, 21 U.S.C. section 360bbb-3(b)(1), unless the authorization is terminated or revoked.  Performed at [facility], [address] [place], [place] [address]    WBC 06/07/2022 5.7  4.0 - 10.5 K/uL Final   RBC 06/07/2022 4.39  3.87 - 5.11 MIL/uL Final   Hemoglobin 06/07/2022 13.2  12.0 - 15.0 g/dL Final   HCT 09/81/1914 40.4  36.0 - 46.0 % Final   MCV 06/07/2022 92.0  80.0 - 100.0 fL Final   MCH 06/07/2022 30.1  26.0 - 34.0 pg Final   MCHC  06/07/2022 32.7  30.0 - 36.0 g/dL Final   RDW 78/46/962908/25/2023 12.4  11.5 - 15.5 % Final   Platelets 06/07/2022 308  150 - 400 K/uL Final   nRBC 06/07/2022 0.0  0.0 - 0.2 % Final   Neutrophils Relative % 06/07/2022 42  % Final   Neutro Abs 06/07/2022 2.4  1.7 - 7.7 K/uL Final   Lymphocytes Relative 06/07/2022 54  % Final   Lymphs Abs 06/07/2022 3.1  0.7 - 4.0 K/uL Final   Monocytes Relative 06/07/2022 4  % Final   Monocytes Absolute 06/07/2022 0.2  0.1 - 1.0 K/uL Final   Eosinophils Relative 06/07/2022 0  % Final   Eosinophils Absolute 06/07/2022 0.0  0.0 - 0.5 K/uL Final   Basophils Relative 06/07/2022 0  % Final   Basophils Absolute 06/07/2022 0.0  0.0 - 0.1 K/uL Final   Immature Granulocytes 06/07/2022 0  % Final   Abs Immature Granulocytes 06/07/2022 0.01  0.00 - 0.07 K/uL Final   Performed at [facility], [address] [place], [place] [address]   Sodium 06/07/2022 139  135 - 145 mmol/L Final   Potassium 06/07/2022 4.0  3.5 - 5.1 mmol/L Final   Chloride 06/07/2022 104  98 - 111 mmol/L Final   CO2 06/07/2022 28  22 - 32 mmol/L Final   Glucose, Bld  06/07/2022 104 (H)  70 - 99 mg/dL Final   Glucose reference range applies only to samples taken after fasting for at least 8 hours.   BUN 06/07/2022 8  6 - 20 mg/dL Final   Creatinine, Ser 06/07/2022 0.84  0.44 - 1.00 mg/dL Final   Calcium 32/44/010208/25/2023 9.1  8.9 - 10.3 mg/dL Final   Total Protein 72/53/664408/25/2023 6.9  6.5 - 8.1 g/dL Final   Albumin 03/47/425908/25/2023 3.7  3.5 - 5.0 g/dL Final   AST 56/38/756408/25/2023 19  15 - 41 U/L Final   ALT 06/07/2022 22  0 - 44 U/L Final   Alkaline Phosphatase 06/07/2022 54  38 - 126 U/L Final   Total Bilirubin 06/07/2022 0.4  0.3 - 1.2 mg/dL Final   GFR, Estimated 06/07/2022 >60  >60 mL/min Final   Comment: (NOTE) Calculated using the CKD-EPI Creatinine Equation (2021)    Anion gap 06/07/2022 7  5 - 15 Final   Performed at [facility], [address] [place], [place] [address]   Hgb A1c MFr Bld 06/07/2022 5.0  4.8 - 5.6 % Final   Comment: (NOTE) Pre diabetes:          5.7%-6.4%  Diabetes:              >6.4%  Glycemic control for   <7.0% adults with diabetes    Mean Plasma Glucose 06/07/2022 96.8  mg/dL Final   Performed at [facility], [address] [place], [place] [address]   TSH 06/07/2022 1.620  0.350 - 4.500 uIU/mL Final   Comment: Performed by a 3rd Generation assay with a functional sensitivity of <=0.01 uIU/mL. Performed at [facility], [address] [place], [place] [address]    RPR Ser Ql 06/07/2022 NON REACTIVE  NON REACTIVE Final   Performed at [facility], [address] [place], [place] [address]   Color, Urine 06/07/2022 YELLOW  YELLOW Final   APPearance 06/07/2022 HAZY (A)  CLEAR Final   Specific Gravity, Urine 06/07/2022 1.018  1.005 - 1.030 Final   pH 06/07/2022 7.0  5.0 - 8.0 Final  Glucose, UA 06/07/2022 NEGATIVE  NEGATIVE mg/dL Final   Hgb urine dipstick 06/07/2022 NEGATIVE  NEGATIVE Final   Bilirubin Urine 06/07/2022 NEGATIVE  NEGATIVE Final   Ketones, ur 06/07/2022 NEGATIVE  NEGATIVE mg/dL Final    Protein, ur 16/07/9603 NEGATIVE  NEGATIVE mg/dL Final   Nitrite 54/06/8118 NEGATIVE  NEGATIVE Final   Leukocytes,Ua 06/07/2022 NEGATIVE  NEGATIVE Final   Performed at [facility], [address], [place], [place] [address]   Cholesterol 06/07/2022 164  0 - 200 mg/dL Final   Triglycerides 95/62/1308 158 (H)  <150 mg/dL Final   HDL 65/78/4696 44  >40 mg/dL Final   Total CHOL/HDL Ratio 06/07/2022 3.7  RATIO Final   VLDL 06/07/2022 32  0 - 40 mg/dL Final   LDL Cholesterol 06/07/2022 88  0 - 99 mg/dL Final   Comment:        Total Cholesterol/HDL:CHD Risk Coronary Heart Disease Risk Table                     Men   Women  1/2 Average Risk   3.4   3.3  Average Risk       5.0   4.4  2 X Average Risk   9.6   7.1  3 X Average Risk  23.4   11.0        Use the calculated Patient Ratio above and the CHD Risk Table to determine the patient's CHD Risk.        ATP III CLASSIFICATION (LDL):  <100     mg/dL   Optimal  295-284  mg/dL   Near or Above                    Optimal  130-159  mg/dL   Borderline  132-440  mg/dL   High  >102     mg/dL   Very High Performed at [facility], [address] [place], [place] [address]    HIV Screen 4th Generation wRfx 06/07/2022 Non Reactive  Non Reactive Final   Performed at [facility], [address], [place], [place] [address]   SARSCOV2ONAVIRUS 2 AG 06/07/2022 NEGATIVE  NEGATIVE Final   Comment: (NOTE) SARS-CoV-2 antigen NOT DETECTED.   Negative results are presumptive.  Negative results do not preclude SARS-CoV-2 infection and should not be used as the sole basis for treatment or other patient management decisions, including infection  control decisions, particularly in the presence of clinical signs and  symptoms consistent with COVID-19, or in those who have been in contact with the virus.  Negative results must be combined with clinical observations, patient history, and epidemiological information. The expected result is  Negative.  Fact Sheet for Patients: https://www.jennings-kim.com/  Fact Sheet for Healthcare Providers: https://alexander-rogers.biz/  This test is not yet approved or cleared by the [place] FDA and  has been authorized for detection and/or diagnosis of SARS-CoV-2 by FDA under an Emergency Use Authorization (EUA).  This EUA will remain in effect (meaning this test can be used) for the duration of  the COV                          ID-19 declaration under Section 564(b)(1) of the Act, 21 U.S.C. section 360bbb-3(b)(1), unless the authorization is terminated or revoked sooner.     POC Amphetamine UR 06/07/2022 None Detected  NONE DETECTED (Cut Off Level 1000 ng/mL) Final   POC  Secobarbital (BAR) 06/07/2022 None Detected  NONE DETECTED (Cut Off Level 300 ng/mL) Final   POC Buprenorphine (BUP) 06/07/2022 None Detected  NONE DETECTED (Cut Off Level 10 ng/mL) Final   POC Oxazepam (BZO) 06/07/2022 None Detected  NONE DETECTED (Cut Off Level 300 ng/mL) Final   POC Cocaine UR 06/07/2022 None Detected  NONE DETECTED (Cut Off Level 300 ng/mL) Final   POC Methamphetamine UR 06/07/2022 None Detected  NONE DETECTED (Cut Off Level 1000 ng/mL) Final   POC Morphine 06/07/2022 None Detected  NONE DETECTED (Cut Off Level 300 ng/mL) Final   POC Methadone UR 06/07/2022 None Detected  NONE DETECTED (Cut Off Level 300 ng/mL) Final   POC Oxycodone UR 06/07/2022 None Detected  NONE DETECTED (Cut Off Level 100 ng/mL) Final   POC Marijuana UR 06/07/2022 None Detected  NONE DETECTED (Cut Off Level 50 ng/mL) Final   Preg Test, Ur 06/08/2022 NEGATIVE  NEGATIVE Final   Comment:        THE SENSITIVITY OF THIS METHODOLOGY IS >20 mIU/mL. Performed at [facility], [address], [place], [place] [address]    Preg Test, Ur 06/08/2022 NEGATIVE  NEGATIVE Final   Comment:        THE SENSITIVITY OF THIS METHODOLOGY IS >24 mIU/mL     Blood Alcohol level:  Lab Results  Component  Value Date   ETH <10 07/04/2022   ETH <10 02/07/2022    Metabolic Disorder Labs: Lab Results  Component Value Date   HGBA1C 5.0 06/07/2022   MPG 96.8 06/07/2022   MPG 96.8 02/07/2022   No results found for: "PROLACTIN" Lab Results  Component Value Date   CHOL 181 07/25/2022   TRIG 118 07/25/2022   HDL 45 07/25/2022   CHOLHDL 4.0 07/25/2022   VLDL 24 07/25/2022   LDLCALC 112 (H) 07/25/2022   LDLCALC 88 06/07/2022    Therapeutic Lab Levels: No results found for: "LITHIUM" No results found for: "VALPROATE" No results found for: "CBMZ"  Physical Findings   GAD-7    Flowsheet Row [facility]17/2021 [facility]  Total GAD-7 Score 17      PHQ2-9    Flowsheet Row ED from 06/07/2022 in [facility] Office Visit from 12/17/2021 [facility]  PHQ-2 Total Score 1 2  PHQ-9 Total Score 2 8      Flowsheet Row ED from 10/07/2022 in [facility] Most recent reading at 10/07/2022  8:16 PM ED from 10/07/2022 in [facility] EMERGENCY DEPARTMENT Most recent reading at 10/07/2022 12:18 PM ED from 07/25/2022 in [facility] Most recent reading at 10/06/2022  9:38 AM  C-SSRS RISK CATEGORY No Risk No Risk No Risk        Musculoskeletal  Strength & Muscle Tone: within normal limits Gait & Station: normal Patient leans: N/A  Psychiatric Specialty Exam  Presentation  General Appearance:  Fairly Groomed  Eye Contact: Poor  Speech: Clear and Coherent  Speech Volume: Normal  Handedness: Right   Mood and Affect  Mood: Euthymic  Affect: Congruent   Thought Process  Thought Processes: Coherent; Goal Directed  Descriptions of Associations:Intact  Orientation:Full (Time, Place and Person)  Thought Content:Logical  Diagnosis of Schizophrenia or Schizoaffective disorder in past: No     Hallucinations:Hallucinations: None   Ideas of Reference:None  Suicidal Thoughts:Suicidal Thoughts: No   Homicidal Thoughts:Homicidal Thoughts: No    Sensorium  Memory: Immediate Fair; Recent Fair; Remote Fair  Judgment: Fair  Insight: Fair   Chartered certified accountant: Fair  Attention Span: Fair  Recall: [employer] of Knowledge: Fair  Language: Fair   Psychomotor Activity  Psychomotor Activity:Psychomotor Activity: Normal    Assets  Assets: Manufacturing systems engineer; Desire for Improvement   Sleep  Sleep: Sleep: Fair Number of Hours of Sleep: 8   Physical Exam  Physical Exam Constitutional:      General: She is not in acute distress.    Appearance: She is ill-appearing. She is not diaphoretic.  HENT:     Head: Normocephalic and atraumatic.  Eyes:     General: No scleral icterus. Pulmonary:     Effort: Prolonged expiration present.  Musculoskeletal:        General: Normal range of motion.  Skin:    General: Skin is warm and dry.  Neurological:     Mental Status: She is alert and oriented to person, place, and time.  Psychiatric:        Attention and Perception: Attention and perception normal.        Mood and Affect: Mood and affect normal.        Speech: Speech normal.        Behavior: Behavior normal. Behavior is cooperative.        Thought Content: Thought content normal.        Judgment: Judgment normal.    Review of Systems  Constitutional:  Negative for chills and fever.  Respiratory:  Negative for shortness of breath.   Cardiovascular:  Negative for chest pain and palpitations.  Gastrointestinal:  Negative for abdominal pain.  Genitourinary:  Positive for dysuria, flank pain and urgency.   Blood pressure 100/66, pulse 99, temperature 98 F (36.7 C), temperature source Oral, resp. rate 19, height  (1.549 m), weight 198 lb (89.8 kg), SpO2 99 %. Body mass index is 37.41 kg/m.  Treatment Plan Summary: Plan Pt  remains psychiatrically cleared. Awaiting placement in residential facility.  Lorri Frederick, MD 10/08/2022 1:26 PM

## 2022-10-08 NOTE — ED Notes (Signed)
Pt is sleeping. No distress noted. Will continue to monitor safety. 

## 2022-10-09 MED ORDER — OLANZAPINE 10 MG PO TABS
10.0000 mg | ORAL_TABLET | ORAL | Status: AC
  Administered 2022-10-09: 10 mg via ORAL
  Filled 2022-10-09: qty 1

## 2022-10-09 NOTE — ED Notes (Signed)
Pt A&O x 4, no distress noted, calm & cooperative, watching TV at present.  Monitoring for safety. 

## 2022-10-09 NOTE — ED Notes (Signed)
Pt given lunch. Denies any acute concerns, coloring and sudoku given. Pt continues to board for placement.

## 2022-10-09 NOTE — ED Notes (Signed)
Pt came to the desk and said no one cares about her and no one is listening to her and she would be wrong if she jumped over this glass and beat the fuck out of all the staff back here

## 2022-10-09 NOTE — ED Notes (Signed)
Pt continues to sleep with even respirations, in no acute distress. Will pass medications when patient awakens.

## 2022-10-09 NOTE — ED Notes (Addendum)
Pt laying down calm and cooperative@this  time. Will continue to monitor for safety and agitation

## 2022-10-09 NOTE — ED Notes (Signed)
Pt sleeping at present, no distress noted.  Monitoring for safety. 

## 2022-10-09 NOTE — ED Notes (Signed)
Snacks given 

## 2022-10-09 NOTE — Progress Notes (Signed)
Pt resting quietly , respirations are even and unlabored.

## 2022-10-09 NOTE — ED Provider Notes (Signed)
Behavioral Health Progress Note  Date and Time: 10/09/2022 11:45 AM Name: Nicole MassonLatasha [name] MRN:  [id number]  Subjective:    Nicole MassonLatasha [name] is a 31 y.o. female, with PMH of SCZ unspecified, MDD with psychosis, who [facility] via [facility] after a verbal altercation with Jeannett SeniorJosephine Okeke (not patient's legal guardian) for transient SI. This is patient's [facility] for similar concerns this year. Patient has been dismissed from previous group home due to threatening SI and HI, then leaving the group home.    Patient evaluated on the unit, reports she has been sleeping well. States her appetite is "ok". Describes her mood today as "alright". Denies any suicidal ideation, contracts for safety. Expresses desire to find placement in [facility].   Patient admits to struggling with paranoid ideations today, recognizes that they are not reasonable. States "I feel like Ms. Luster LandsbergRenee might try to hurt me, I know that can't be possible, this is something I struggle with". Also expresses concern for poor communication with her father, who is her legal guardian, feels that he is not available and is concerned he may not be present should a medical emergency occur. States " sometimes I feel he does not have my best interest at heart".   Denies homicidal ideation. Denies auditory and visual hallucinations. Denies thought insertion, thought withdrawal, and ideas of reference.   Reports her UTI symptoms have improved, continue to encourage patient to drink plenty of fluids. She denies any side effects to currently prescribed psychiatric medications.    Diagnosis:  Final diagnoses:  Tobacco use disorder  Schizophrenia, unspecified type (HCC)  MDD (major depressive disorder), recurrent episode, moderate (HCC)  Hypothyroidism, unspecified type    Total Time spent with patient: 15 minutes  Past Psychiatric History: see HPI Past Medical History:  Past Medical History:  Diagnosis Date    Anxiety    Depression    Hypothyroidism 08/07/2022   Tobacco use disorder 08/07/2022    Past Surgical History:  Procedure Laterality Date   WISDOM TOOTH EXTRACTION Bilateral 2020   Family History:  Family History  Problem Relation Age of Onset   Hypertension Father    Diabetes Father    Family Psychiatric  History: None reported Social History:  Social History   Substance and Sexual Activity  Alcohol Use Never     Social History   Substance and Sexual Activity  Drug Use Never    Social History   Socioeconomic History   Marital status: Single    Spouse name: Not on file   Number of children: Not on file   Years of education: Not on file   Highest education level: Not on file  Occupational History   Not on file  Tobacco Use   Smoking status: Every Day    Types: Cigarettes   Smokeless tobacco: Never  Vaping Use   Vaping Use: Some days  Substance and Sexual Activity   Alcohol use: Never   Drug use: Never   Sexual activity: Yes    Partners: Female    Birth control/protection: Implant  Other Topics Concern   Not on file  Social History Narrative   Not on file   Social Determinants of Health   Financial Resource Strain: Not on file  Food Insecurity: Not on file  Transportation Needs: Not on file  Physical Activity: Not on file  Stress: Not on file  Social Connections: Not on file   SDOH:  SDOH Screenings   Depression (PHQ2-9): Low Risk  (  06/10/2022)  Tobacco Use: High Risk (10/07/2022)   Additional Social History: N/A  Sleep: Good  Appetite:  Good  Current Medications:  Current Facility-Administered Medications  Medication Dose Route Frequency Provider Last Rate Last Admin   acetaminophen (TYLENOL) tablet 650 mg  650 mg Oral Q6H PRN Marlou Sa, NP       alum & mag hydroxide-simeth (MAALOX/MYLANTA) 200-200-20 MG/5ML suspension 30 mL  30 mL Oral Q4H PRN Rayburn Go, Veronique M, NP   30 mL at 10/07/22 2303   docusate sodium (COLACE)  capsule 100 mg  100 mg Oral Daily Carrion-Carrero, Karle Starch, MD   100 mg at 10/09/22 0946   hydrOXYzine (ATARAX) tablet 25 mg  25 mg Oral TID PRN Lorri Frederick, MD   25 mg at 10/08/22 2121   levothyroxine (SYNTHROID) tablet 100 mcg  100 mcg Oral Once Byungura, Veronique M, NP       levothyroxine (SYNTHROID) tablet 100 mcg  100 mcg Oral Q0600 Marlou Sa, NP   100 mcg at 10/09/22 0606   loratadine (CLARITIN) tablet 10 mg  10 mg Oral Daily Carrion-Carrero, Karle Starch, MD   10 mg at 10/09/22 0946   magnesium hydroxide (MILK OF MAGNESIA) suspension 30 mL  30 mL Oral Daily PRN Marlou Sa, NP       metFORMIN (GLUCOPHAGE) tablet 500 mg  500 mg Oral Q breakfast Carrion-Carrero, Venie Montesinos, MD   500 mg at 10/09/22 0946   nicotine (NICODERM CQ - dosed in mg/24 hours) patch 14 mg  14 mg Transdermal Daily PRN Carrion-Carrero, Elyn Krogh, MD       nitrofurantoin (macrocrystal-monohydrate) (MACROBID) capsule 100 mg  100 mg Oral Q12H Rayburn Go, Veronique M, NP   100 mg at 10/09/22 0946   ondansetron (ZOFRAN-ODT) disintegrating tablet 4 mg  4 mg Oral Q8H PRN Carrion-Carrero, Sarath Privott, MD       Oxcarbazepine (TRILEPTAL) tablet 300 mg  300 mg Oral BID Byungura, Veronique M, NP   300 mg at 10/09/22 0946   pantoprazole (PROTONIX) EC tablet 40 mg  40 mg Oral Daily Carrion-Carrero, Karle Starch, MD   40 mg at 10/09/22 0946   QUEtiapine (SEROQUEL) tablet 400 mg  400 mg Oral BID Rayburn Go, Veronique M, NP   400 mg at 10/09/22 0946   sertraline (ZOLOFT) tablet 150 mg  150 mg Oral Daily Carrion-Carrero, Karle Starch, MD   150 mg at 10/09/22 0946   traZODone (DESYREL) tablet 100 mg  100 mg Oral QHS Olin Pia M, NP   100 mg at 10/08/22 2121   traZODone (DESYREL) tablet 50 mg  50 mg Oral QHS PRN Marlou Sa, NP       valACYclovir (VALTREX) tablet 500 mg  500 mg Oral Daily Carrion-Carrero, Prosper Paff, MD   500 mg at 10/09/22 0946   Current Outpatient Medications  Medication Sig Dispense Refill    ABILIFY MAINTENA 400 MG PRSY prefilled syringe 400 mg every 28 (twenty-eight) days.     cetirizine (ZYRTEC) 10 MG tablet Take 10 mg by mouth daily.     cyclobenzaprine (FLEXERIL) 10 MG tablet Take 1 tablet (10 mg total) by mouth 2 (two) times daily as needed for muscle spasms. 20 tablet 0   fluticasone (FLONASE) 50 MCG/ACT nasal spray Place 1 spray into both nostrils daily.     meloxicam (MOBIC) 15 MG tablet Take 15 mg by mouth daily.     nitrofurantoin, macrocrystal-monohydrate, (MACROBID) 100 MG capsule Take 1 capsule (100 mg total) by mouth 2 (two) times daily. 10 capsule 0  ondansetron (ZOFRAN-ODT) 4 MG disintegrating tablet Take 1 tablet (4 mg total) by mouth every 8 (eight) hours as needed for nausea or vomiting. 20 tablet 0   Oxcarbazepine (TRILEPTAL) 300 MG tablet Take 1 tablet (300 mg total) by mouth 2 (two) times daily. 60 tablet 0   QUEtiapine (SEROQUEL) 400 MG tablet Take 1 tablet (400 mg total) by mouth 2 (two) times daily. 60 tablet 0   sertraline (ZOLOFT) 50 MG tablet Take 3 tablets (150 mg total) by mouth in the morning. 90 tablet 0   traZODone (DESYREL) 100 MG tablet Take 1 tablet (100 mg total) by mouth at bedtime. 30 tablet 0   valACYclovir (VALTREX) 500 MG tablet Take 500 mg by mouth daily.      Labs  Lab Results:  Admission on 10/07/2022, Discharged on 10/07/2022  Component Date Value Ref Range Status   Color, Urine 10/07/2022 YELLOW  YELLOW Final   APPearance 10/07/2022 HAZY (A)  CLEAR Final   Specific Gravity, Urine 10/07/2022 1.011  1.005 - 1.030 Final   pH 10/07/2022 7.0  5.0 - 8.0 Final   Glucose, UA 10/07/2022 NEGATIVE  NEGATIVE mg/dL Final   Hgb urine dipstick 10/07/2022 SMALL (A)  NEGATIVE Final   Bilirubin Urine 10/07/2022 NEGATIVE  NEGATIVE Final   Ketones, ur 10/07/2022 NEGATIVE  NEGATIVE mg/dL Final   Protein, ur 16/07/9603 NEGATIVE  NEGATIVE mg/dL Final   Nitrite 54/06/8118 NEGATIVE  NEGATIVE Final   Leukocytes,Ua 10/07/2022 LARGE (A)  NEGATIVE Final    RBC / HPF 10/07/2022 0-5  0 - 5 RBC/hpf Final   WBC, UA 10/07/2022 0-5  0 - 5 WBC/hpf Final   Bacteria, UA 10/07/2022 FEW (A)  NONE SEEN Final   Squamous Epithelial / LPF 10/07/2022 11-20  0 - 5 Final   Performed at [facility], [address], [place], [place] [address]   I-stat hCG, quantitative 10/07/2022 <5.0  <5 mIU/mL Final   Comment 3 10/07/2022          Final   Comment:   GEST. AGE      CONC.  (mIU/mL)   <=1 WEEK        5 - 50     2 WEEKS       50 - 500     3 WEEKS       100 - 10,000     4 WEEKS     1,000 - 30,000        FEMALE AND NON-PREGNANT FEMALE:     LESS THAN 5 mIU/mL    Sodium 10/07/2022 138  135 - 145 mmol/L Final   Potassium 10/07/2022 4.3  3.5 - 5.1 mmol/L Final   Chloride 10/07/2022 104  98 - 111 mmol/L Final   CO2 10/07/2022 28  22 - 32 mmol/L Final   Glucose, Bld 10/07/2022 90  70 - 99 mg/dL Final   Glucose reference range applies only to samples taken after fasting for at least 8 hours.   BUN 10/07/2022 8  6 - 20 mg/dL Final   Creatinine, Ser 10/07/2022 0.96  0.44 - 1.00 mg/dL Final   Calcium 95/62/1308 9.1  8.9 - 10.3 mg/dL Final   GFR, Estimated 10/07/2022 >60  >60 mL/min Final   Comment: (NOTE) Calculated using the CKD-EPI Creatinine Equation (2021)    Anion gap 10/07/2022 6  5 - 15 Final   Performed at [facility], [address], [place], [place] [address]   WBC 10/07/2022 5.8  4.0 -  10.5 K/uL Final   RBC 10/07/2022 4.36  3.87 - 5.11 MIL/uL Final   Hemoglobin 10/07/2022 12.9  12.0 - 15.0 g/dL Final   HCT 25/95/6387 40.0  36.0 - 46.0 % Final   MCV 10/07/2022 91.7  80.0 - 100.0 fL Final   MCH 10/07/2022 29.6  26.0 - 34.0 pg Final   MCHC 10/07/2022 32.3  30.0 - 36.0 g/dL Final   RDW 56/43/3295 12.4  11.5 - 15.5 % Final   Platelets 10/07/2022 295  150 - 400 K/uL Final   nRBC 10/07/2022 0.0  0.0 - 0.2 % Final   Performed at [facility], [address] [place], [place] [address]   SARS Coronavirus 2 by RT PCR 10/07/2022 NEGATIVE   NEGATIVE Final   Comment: (NOTE) SARS-CoV-2 target nucleic acids are NOT DETECTED.  The SARS-CoV-2 RNA is generally detectable in upper respiratory specimens during the acute phase of infection. The lowest concentration of SARS-CoV-2 viral copies this assay can detect is 138 copies/mL. A negative result does not preclude SARS-Cov-2 infection and should not be used as the sole basis for treatment or other patient management decisions. A negative result may occur with  improper specimen collection/handling, submission of specimen other than nasopharyngeal swab, presence of viral mutation(s) within the areas targeted by this assay, and inadequate number of viral copies(<138 copies/mL). A negative result must be combined with clinical observations, patient history, and epidemiological information. The expected result is Negative.  Fact Sheet for Patients:  BloggerCourse.com  Fact Sheet for Healthcare Providers:  SeriousBroker.it  This test is no                          t yet approved or cleared by the [place] FDA and  has been authorized for detection and/or diagnosis of SARS-CoV-2 by FDA under an Emergency Use Authorization (EUA). This EUA will remain  in effect (meaning this test can be used) for the duration of the COVID-19 declaration under Section 564(b)(1) of the Act, 21 U.S.C.section 360bbb-3(b)(1), unless the authorization is terminated  or revoked sooner.       Influenza A by PCR 10/07/2022 NEGATIVE  NEGATIVE Final   Influenza B by PCR 10/07/2022 NEGATIVE  NEGATIVE Final   Comment: (NOTE) The Xpert Xpress SARS-CoV-2/FLU/RSV plus assay is intended as an aid in the diagnosis of influenza from Nasopharyngeal swab specimens and should not be used as a sole basis for treatment. Nasal washings and aspirates are unacceptable for Xpert Xpress SARS-CoV-2/FLU/RSV testing.  Fact Sheet for  Patients: BloggerCourse.com  Fact Sheet for Healthcare Providers: SeriousBroker.it  This test is not yet approved or cleared by the [place] FDA and has been authorized for detection and/or diagnosis of SARS-CoV-2 by FDA under an Emergency Use Authorization (EUA). This EUA will remain in effect (meaning this test can be used) for the duration of the COVID-19 declaration under Section 564(b)(1) of the Act, 21 U.S.C. section 360bbb-3(b)(1), unless the authorization is terminated or revoked.     Resp Syncytial Virus by PCR 10/07/2022 NEGATIVE  NEGATIVE Final   Comment: (NOTE) Fact Sheet for Patients: BloggerCourse.com  Fact Sheet for Healthcare Providers: SeriousBroker.it  This test is not yet approved or cleared by the [place] FDA and has been authorized for detection and/or diagnosis of SARS-CoV-2 by FDA under an Emergency Use Authorization (EUA). This EUA will remain in effect (meaning this test can be used) for the duration of the COVID-19 declaration under Section  564(b)(1) of the Act, 21 U.S.C. section 360bbb-3(b)(1), unless the authorization is terminated or revoked.  Performed at [facility], [address], [place], [place] [address]    Specimen Description 10/07/2022 URINE, CLEAN CATCH   Final   Special Requests 10/07/2022    Final                   Value:NONE Performed at [facility], [address] [place], [place] [address]    Culture 10/07/2022 MULTIPLE SPECIES PRESENT, SUGGEST RECOLLECTION (A)   Final   Report Status 10/07/2022 10/08/2022 FINAL   Final  Admission on 07/25/2022, Discharged on 10/07/2022  Component Date Value Ref Range Status   SARS Coronavirus 2 by RT PCR 07/25/2022 NEGATIVE  NEGATIVE Final   Comment: (NOTE) SARS-CoV-2 target nucleic acids are NOT DETECTED.  The SARS-CoV-2 RNA is generally detectable in upper  respiratory specimens during the acute phase of infection. The lowest concentration of SARS-CoV-2 viral copies this assay can detect is 138 copies/mL. A negative result does not preclude SARS-Cov-2 infection and should not be used as the sole basis for treatment or other patient management decisions. A negative result may occur with  improper specimen collection/handling, submission of specimen other than nasopharyngeal swab, presence of viral mutation(s) within the areas targeted by this assay, and inadequate number of viral copies(<138 copies/mL). A negative result must be combined with clinical observations, patient history, and epidemiological information. The expected result is Negative.  Fact Sheet for Patients:  BloggerCourse.com  Fact Sheet for Healthcare Providers:  SeriousBroker.it  This test is no                          t yet approved or cleared by the [place] FDA and  has been authorized for detection and/or diagnosis of SARS-CoV-2 by FDA under an Emergency Use Authorization (EUA). This EUA will remain  in effect (meaning this test can be used) for the duration of the COVID-19 declaration under Section 564(b)(1) of the Act, 21 U.S.C.section 360bbb-3(b)(1), unless the authorization is terminated  or revoked sooner.       Influenza A by PCR 07/25/2022 NEGATIVE  NEGATIVE Final   Influenza B by PCR 07/25/2022 NEGATIVE  NEGATIVE Final   Comment: (NOTE) The Xpert Xpress SARS-CoV-2/FLU/RSV plus assay is intended as an aid in the diagnosis of influenza from Nasopharyngeal swab specimens and should not be used as a sole basis for treatment. Nasal washings and aspirates are unacceptable for Xpert Xpress SARS-CoV-2/FLU/RSV testing.  Fact Sheet for Patients: BloggerCourse.com  Fact Sheet for Healthcare Providers: SeriousBroker.it  This test is not yet approved or  cleared by the [place] FDA and has been authorized for detection and/or diagnosis of SARS-CoV-2 by FDA under an Emergency Use Authorization (EUA). This EUA will remain in effect (meaning this test can be used) for the duration of the COVID-19 declaration under Section 564(b)(1) of the Act, 21 U.S.C. section 360bbb-3(b)(1), unless the authorization is terminated or revoked.  Performed at [facility], [address], , [place] [address]    WBC 07/25/2022 8.3  4.0 - 10.5 K/uL Final   RBC 07/25/2022 4.44  3.87 - 5.11 MIL/uL Final   Hemoglobin 07/25/2022 13.7  12.0 - 15.0 g/dL Final   HCT 78/46/9629 40.2  36.0 - 46.0 % Final   MCV 07/25/2022 90.5  80.0 - 100.0 fL Final   MCH 07/25/2022 30.9  26.0 - 34.0 pg  Final   MCHC 07/25/2022 34.1  30.0 - 36.0 g/dL Final   RDW 61/60/7371 12.2  11.5 - 15.5 % Final   Platelets 07/25/2022 248  150 - 400 K/uL Final   nRBC 07/25/2022 0.0  0.0 - 0.2 % Final   Neutrophils Relative % 07/25/2022 43  % Final   Neutro Abs 07/25/2022 3.6  1.7 - 7.7 K/uL Final   Lymphocytes Relative 07/25/2022 52  % Final   Lymphs Abs 07/25/2022 4.2 (H)  0.7 - 4.0 K/uL Final   Monocytes Relative 07/25/2022 5  % Final   Monocytes Absolute 07/25/2022 0.4  0.1 - 1.0 K/uL Final   Eosinophils Relative 07/25/2022 0  % Final   Eosinophils Absolute 07/25/2022 0.0  0.0 - 0.5 K/uL Final   Basophils Relative 07/25/2022 0  % Final   Basophils Absolute 07/25/2022 0.0  0.0 - 0.1 K/uL Final   Immature Granulocytes 07/25/2022 0  % Final   Abs Immature Granulocytes 07/25/2022 0.02  0.00 - 0.07 K/uL Final   Performed at [facility], [address], [place], [place] [address]   Sodium 07/25/2022 138  135 - 145 mmol/L Final   Potassium 07/25/2022 4.0  3.5 - 5.1 mmol/L Final   Chloride 07/25/2022 104  98 - 111 mmol/L Final   CO2 07/25/2022 29  22 - 32 mmol/L Final   Glucose, Bld 07/25/2022 83  70 - 99 mg/dL Final   Glucose reference range applies only to samples taken  after fasting for at least 8 hours.   BUN 07/25/2022 11  6 - 20 mg/dL Final   Creatinine, Ser 07/25/2022 0.97  0.44 - 1.00 mg/dL Final   Calcium 48/54/6270 9.2  8.9 - 10.3 mg/dL Final   Total Protein 35/00/9381 7.0  6.5 - 8.1 g/dL Final   Albumin 82/99/3716 3.8  3.5 - 5.0 g/dL Final   AST 96/78/9381 18  15 - 41 U/L Final   ALT 07/25/2022 22  0 - 44 U/L Final   Alkaline Phosphatase 07/25/2022 64  38 - 126 U/L Final   Total Bilirubin 07/25/2022 0.2 (L)  0.3 - 1.2 mg/dL Final   GFR, Estimated 07/25/2022 >60  >60 mL/min Final   Comment: (NOTE) Calculated using the CKD-EPI Creatinine Equation (2021)    Anion gap 07/25/2022 5  5 - 15 Final   Performed at [facility], [address] [place], [place] [address]   POC Amphetamine UR 07/25/2022 None Detected  NONE DETECTED (Cut Off Level 1000 ng/mL) Preliminary   POC Secobarbital (BAR) 07/25/2022 None Detected  NONE DETECTED (Cut Off Level 300 ng/mL) Preliminary   POC Buprenorphine (BUP) 07/25/2022 None Detected  NONE DETECTED (Cut Off Level 10 ng/mL) Preliminary   POC Oxazepam (BZO) 07/25/2022 None Detected  NONE DETECTED (Cut Off Level 300 ng/mL) Preliminary   POC Cocaine UR 07/25/2022 None Detected  NONE DETECTED (Cut Off Level 300 ng/mL) Preliminary   POC Methamphetamine UR 07/25/2022 None Detected  NONE DETECTED (Cut Off Level 1000 ng/mL) Preliminary   POC Morphine 07/25/2022 None Detected  NONE DETECTED (Cut Off Level 300 ng/mL) Preliminary   POC Methadone UR 07/25/2022 None Detected  NONE DETECTED (Cut Off Level 300 ng/mL) Preliminary   POC Oxycodone UR 07/25/2022 None Detected  NONE DETECTED (Cut Off Level 100 ng/mL) Preliminary   POC Marijuana UR 07/25/2022 None Detected  NONE DETECTED (Cut Off Level 50 ng/mL) Preliminary   SARSCOV2ONAVIRUS 2 AG 07/25/2022 NEGATIVE  NEGATIVE Final   Comment: (NOTE) SARS-CoV-2 antigen  NOT DETECTED.   Negative results are presumptive.  Negative results do not preclude SARS-CoV-2 infection and  should not be used as the sole basis for treatment or other patient management decisions, including infection  control decisions, particularly in the presence of clinical signs and  symptoms consistent with COVID-19, or in those who have been in contact with the virus.  Negative results must be combined with clinical observations, patient history, and epidemiological information. The expected result is Negative.  Fact Sheet for Patients: https://www.jennings-kim.com/  Fact Sheet for Healthcare Providers: https://alexander-rogers.biz/  This test is not yet approved or cleared by the [place] FDA and  has been authorized for detection and/or diagnosis of SARS-CoV-2 by FDA under an Emergency Use Authorization (EUA).  This EUA will remain in effect (meaning this test can be used) for the duration of  the COV                          ID-19 declaration under Section 564(b)(1) of the Act, 21 U.S.C. section 360bbb-3(b)(1), unless the authorization is terminated or revoked sooner.     Cholesterol 07/25/2022 181  0 - 200 mg/dL Final   Triglycerides 09/81/1914 118  <150 mg/dL Final   HDL 78/29/5621 45  >40 mg/dL Final   Total CHOL/HDL Ratio 07/25/2022 4.0  RATIO Final   VLDL 07/25/2022 24  0 - 40 mg/dL Final   LDL Cholesterol 07/25/2022 112 (H)  0 - 99 mg/dL Final   Comment:        Total Cholesterol/HDL:CHD Risk Coronary Heart Disease Risk Table                     Men   Women  1/2 Average Risk   3.4   3.3  Average Risk       5.0   4.4  2 X Average Risk   9.6   7.1  3 X Average Risk  23.4   11.0        Use the calculated Patient Ratio above and the CHD Risk Table to determine the patient's CHD Risk.        ATP III CLASSIFICATION (LDL):  <100     mg/dL   Optimal  308-657  mg/dL   Near or Above                    Optimal  130-159  mg/dL   Borderline  846-962  mg/dL   High  >952     mg/dL   Very High Performed at [facility], [address] [place], [place] [address]    TSH 07/25/2022 6.668 (H)  0.350 - 4.500 uIU/mL Final   Comment: Performed by a 3rd Generation assay with a functional sensitivity of <=0.01 uIU/mL. Performed at [facility], [address] [place], [place] [address]    Glucose-Capillary 07/26/2022 104 (H)  70 - 99 mg/dL Final   Glucose reference range applies only to samples taken after fasting for at least 8 hours.   T3, Free 07/31/2022 2.3  2.0 - 4.4 pg/mL Final   Comment: (NOTE) Performed At: [facility] [address] [place], [place] [id number] Jolene Schimke MD IH:[id number]    Free T4 07/31/2022 0.60 (L)  0.61 - 1.12 ng/dL Final   Comment: (NOTE) Biotin ingestion may interfere with free T4 tests. If the results are inconsistent with the TSH level, previous test results, or the clinical  presentation, then consider biotin interference. If needed, order repeat testing after stopping biotin. Performed at [facility], [address], [place], [place] [address]    Glucose-Capillary 08/29/2022 100 (H)  70 - 99 mg/dL Final   Glucose reference range applies only to samples taken after fasting for at least 8 hours.   TSH 09/05/2022 0.793  0.350 - 4.500 uIU/mL Final   Comment: Performed by a 3rd Generation assay with a functional sensitivity of <=0.01 uIU/mL. Performed at [facility], [address] [place], [place] [address]    Free T4 09/05/2022 0.73  0.61 - 1.12 ng/dL Final   Comment: (NOTE) Biotin ingestion may interfere with free T4 tests. If the results are inconsistent with the TSH level, previous test results, or the clinical presentation, then consider biotin interference. If needed, order repeat testing after stopping biotin. Performed at [facility], [address] [place], [place] [address]    Preg Test, Ur 09/06/2022 Negative  Negative Final   Preg Test, Ur 09/06/2022 NEGATIVE  NEGATIVE Final   Comment:        THE SENSITIVITY OF THIS METHODOLOGY IS  >24 mIU/mL    Preg Test, Ur 09/05/2022 NEGATIVE  NEGATIVE Final   Comment:        THE SENSITIVITY OF THIS METHODOLOGY IS >24 mIU/mL    Sodium 09/13/2022 136  135 - 145 mmol/L Final   Potassium 09/13/2022 4.5  3.5 - 5.1 mmol/L Final   Chloride 09/13/2022 105  98 - 111 mmol/L Final   CO2 09/13/2022 21 (L)  22 - 32 mmol/L Final   Glucose, Bld 09/13/2022 111 (H)  70 - 99 mg/dL Final   Glucose reference range applies only to samples taken after fasting for at least 8 hours.   BUN 09/13/2022 14  6 - 20 mg/dL Final   Creatinine, Ser 09/13/2022 0.92  0.44 - 1.00 mg/dL Final   Calcium 91/47/8295 9.1  8.9 - 10.3 mg/dL Final   GFR, Estimated 09/13/2022 >60  >60 mL/min Final   Comment: (NOTE) Calculated using the CKD-EPI Creatinine Equation (2021)    Anion gap 09/13/2022 10  5 - 15 Final   Performed at [facility], [address] [place], [place] [address]   Vitamin B-12 09/13/2022 585  180 - 914 pg/mL Final   Comment: (NOTE) This assay is not validated for testing neonatal or myeloproliferative syndrome specimens for Vitamin B12 levels. Performed at [facility], [address], [place], [place] [address]    Color, Urine 09/14/2022 YELLOW  YELLOW Final   APPearance 09/14/2022 HAZY (A)  CLEAR Final   Specific Gravity, Urine 09/14/2022 1.025  1.005 - 1.030 Final   pH 09/14/2022 5.0  5.0 - 8.0 Final   Glucose, UA 09/14/2022 NEGATIVE  NEGATIVE mg/dL Final   Hgb urine dipstick 09/14/2022 NEGATIVE  NEGATIVE Final   Bilirubin Urine 09/14/2022 NEGATIVE  NEGATIVE Final   Ketones, ur 09/14/2022 NEGATIVE  NEGATIVE mg/dL Final   Protein, ur 46/96/2952 NEGATIVE  NEGATIVE mg/dL Final   Nitrite 84/13/2440 NEGATIVE  NEGATIVE Final   Leukocytes,Ua 09/14/2022 TRACE (A)  NEGATIVE Final   RBC / HPF 09/14/2022 0-5  0 - 5 RBC/hpf Final   WBC, UA 09/14/2022 0-5  0 - 5 WBC/hpf Final   Bacteria, UA 09/14/2022 RARE (A)  NONE SEEN Final   Squamous Epithelial / LPF 09/14/2022 0-5  0 - 5 Final    Mucus 09/14/2022 PRESENT   Final  Performed at [facility], [address], [place], [place] [address]   Glucose-Capillary 09/16/2022 105 (H)  70 - 99 mg/dL Final   Glucose reference range applies only to samples taken after fasting for at least 8 hours.   SARS Coronavirus 2 by RT PCR 09/30/2022 NEGATIVE  NEGATIVE Final   Comment: (NOTE) SARS-CoV-2 target nucleic acids are NOT DETECTED.  The SARS-CoV-2 RNA is generally detectable in upper respiratory specimens during the acute phase of infection. The lowest concentration of SARS-CoV-2 viral copies this assay can detect is 138 copies/mL. A negative result does not preclude SARS-Cov-2 infection and should not be used as the sole basis for treatment or other patient management decisions. A negative result may occur with  improper specimen collection/handling, submission of specimen other than nasopharyngeal swab, presence of viral mutation(s) within the areas targeted by this assay, and inadequate number of viral copies(<138 copies/mL). A negative result must be combined with clinical observations, patient history, and epidemiological information. The expected result is Negative.  Fact Sheet for Patients:  BloggerCourse.com  Fact Sheet for Healthcare Providers:  SeriousBroker.it  This test is no                          t yet approved or cleared by the [place] FDA and  has been authorized for detection and/or diagnosis of SARS-CoV-2 by FDA under an Emergency Use Authorization (EUA). This EUA will remain  in effect (meaning this test can be used) for the duration of the COVID-19 declaration under Section 564(b)(1) of the Act, 21 U.S.C.section 360bbb-3(b)(1), unless the authorization is terminated  or revoked sooner.       Influenza A by PCR 09/30/2022 NEGATIVE  NEGATIVE Final   Influenza B by PCR 09/30/2022 NEGATIVE  NEGATIVE Final   Comment: (NOTE) The Xpert Xpress  SARS-CoV-2/FLU/RSV plus assay is intended as an aid in the diagnosis of influenza from Nasopharyngeal swab specimens and should not be used as a sole basis for treatment. Nasal washings and aspirates are unacceptable for Xpert Xpress SARS-CoV-2/FLU/RSV testing.  Fact Sheet for Patients: BloggerCourse.com  Fact Sheet for Healthcare Providers: SeriousBroker.it  This test is not yet approved or cleared by the [place] FDA and has been authorized for detection and/or diagnosis of SARS-CoV-2 by FDA under an Emergency Use Authorization (EUA). This EUA will remain in effect (meaning this test can be used) for the duration of the COVID-19 declaration under Section 564(b)(1) of the Act, 21 U.S.C. section 360bbb-3(b)(1), unless the authorization is terminated or revoked.     Resp Syncytial Virus by PCR 09/30/2022 NEGATIVE  NEGATIVE Final   Comment: (NOTE) Fact Sheet for Patients: BloggerCourse.com  Fact Sheet for Healthcare Providers: SeriousBroker.it  This test is not yet approved or cleared by the [place] FDA and has been authorized for detection and/or diagnosis of SARS-CoV-2 by FDA under an Emergency Use Authorization (EUA). This EUA will remain in effect (meaning this test can be used) for the duration of the COVID-19 declaration under Section 564(b)(1) of the Act, 21 U.S.C. section 360bbb-3(b)(1), unless the authorization is terminated or revoked.  Performed at [facility], [address] [place], [place] [address]    Sodium 10/01/2022 136  135 - 145 mmol/L Final   Potassium 10/01/2022 3.8  3.5 - 5.1 mmol/L Final   Chloride 10/01/2022 104  98 - 111 mmol/L Final   CO2 10/01/2022 27  22 - 32 mmol/L Final  Glucose, Bld 10/01/2022 98  70 - 99 mg/dL Final   Glucose reference range applies only to samples taken after fasting for at least 8 hours.   BUN 10/01/2022  12  6 - 20 mg/dL Final   Creatinine, Ser 10/01/2022 0.98  0.44 - 1.00 mg/dL Final   Calcium 40/98/1191 8.9  8.9 - 10.3 mg/dL Final   GFR, Estimated 10/01/2022 >60  >60 mL/min Final   Comment: (NOTE) Calculated using the CKD-EPI Creatinine Equation (2021)    Anion gap 10/01/2022 5  5 - 15 Final   Performed at [facility], [address], [place], [place] [address]   WBC 10/01/2022 5.1  4.0 - 10.5 K/uL Final   RBC 10/01/2022 4.31  3.87 - 5.11 MIL/uL Final   Hemoglobin 10/01/2022 12.7  12.0 - 15.0 g/dL Final   HCT 56/21/3086 38.8  36.0 - 46.0 % Final   MCV 10/01/2022 90.0  80.0 - 100.0 fL Final   MCH 10/01/2022 29.5  26.0 - 34.0 pg Final   MCHC 10/01/2022 32.7  30.0 - 36.0 g/dL Final   RDW 57/84/6962 12.4  11.5 - 15.5 % Final   Platelets 10/01/2022 300  150 - 400 K/uL Final   nRBC 10/01/2022 0.0  0.0 - 0.2 % Final   Performed at [facility], [address], [place], [place] [address]   Lipase 10/01/2022 29  11 - 51 U/L Final   Performed at [facility], [address], [place], [place] [address]   Color, Urine 10/05/2022 YELLOW  YELLOW Final   APPearance 10/05/2022 HAZY (A)  CLEAR Final   Specific Gravity, Urine 10/05/2022 1.018  1.005 - 1.030 Final   pH 10/05/2022 5.0  5.0 - 8.0 Final   Glucose, UA 10/05/2022 NEGATIVE  NEGATIVE mg/dL Final   Hgb urine dipstick 10/05/2022 NEGATIVE  NEGATIVE Final   Bilirubin Urine 10/05/2022 NEGATIVE  NEGATIVE Final   Ketones, ur 10/05/2022 NEGATIVE  NEGATIVE mg/dL Final   Protein, ur 10/16/7251 NEGATIVE  NEGATIVE mg/dL Final   Nitrite 66/44/0347 NEGATIVE  NEGATIVE Final   Leukocytes,Ua 10/05/2022 TRACE (A)  NEGATIVE Final   RBC / HPF 10/05/2022 0-5  0 - 5 RBC/hpf Final   WBC, UA 10/05/2022 0-5  0 - 5 WBC/hpf Final   Bacteria, UA 10/05/2022 RARE (A)  NONE SEEN Final   Squamous Epithelial / LPF 10/05/2022 0-5  0 - 5 Final   Mucus 10/05/2022 PRESENT   Final   Performed at [facility], [address], [place], [place]  [address]   Specimen Description 10/05/2022 URINE, CLEAN CATCH   Final   Special Requests 10/05/2022    Final                   Value:NONE Performed at [facility], [address] [place], [place] [address]    Culture 10/05/2022 10,000 COLONIES/mL MULTIPLE SPECIES PRESENT, SUGGEST RECOLLECTION (A)   Final   Report Status 10/05/2022 10/07/2022 FINAL   Final  Admission on 07/04/2022, Discharged on 07/05/2022  Component Date Value Ref Range Status   Sodium 07/04/2022 142  135 - 145 mmol/L Final   Potassium 07/04/2022 4.4  3.5 - 5.1 mmol/L Final   Chloride 07/04/2022 109  98 - 111 mmol/L Final   CO2 07/04/2022 26  22 - 32 mmol/L Final   Glucose, Bld 07/04/2022 96  70 - 99 mg/dL Final   Glucose reference range applies only to samples taken after fasting  for at least 8 hours.   BUN 07/04/2022 15  6 - 20 mg/dL Final   Creatinine, Ser 07/04/2022 0.83  0.44 - 1.00 mg/dL Final   Calcium 16/07/9603 9.3  8.9 - 10.3 mg/dL Final   Total Protein 54/06/8118 7.2  6.5 - 8.1 g/dL Final   Albumin 14/78/2956 3.9  3.5 - 5.0 g/dL Final   AST 21/30/8657 23  15 - 41 U/L Final   ALT 07/04/2022 28  0 - 44 U/L Final   Alkaline Phosphatase 07/04/2022 77  38 - 126 U/L Final   Total Bilirubin 07/04/2022 0.4  0.3 - 1.2 mg/dL Final   GFR, Estimated 07/04/2022 >60  >60 mL/min Final   Comment: (NOTE) Calculated using the CKD-EPI Creatinine Equation (2021)    Anion gap 07/04/2022 7  5 - 15 Final   Performed at [facility], [address] [place], [place] [address]   Alcohol, Ethyl (B) 07/04/2022 <10  <10 mg/dL Final   Comment: (NOTE) Lowest detectable limit for serum alcohol is 10 mg/dL.  For medical purposes only. Performed at [facility], [address], [place], [place] [address]    WBC 07/04/2022 7.0  4.0 - 10.5 K/uL Final   RBC 07/04/2022 4.18  3.87 - 5.11 MIL/uL Final   Hemoglobin 07/04/2022 12.8  12.0 - 15.0 g/dL Final   HCT 41/32/4401 39.1  36.0 - 46.0 %  Final   MCV 07/04/2022 93.5  80.0 - 100.0 fL Final   MCH 07/04/2022 30.6  26.0 - 34.0 pg Final   MCHC 07/04/2022 32.7  30.0 - 36.0 g/dL Final   RDW 02/72/5366 12.9  11.5 - 15.5 % Final   Platelets 07/04/2022 243  150 - 400 K/uL Final   nRBC 07/04/2022 0.0  0.0 - 0.2 % Final   Neutrophils Relative % 07/04/2022 43  % Final   Neutro Abs 07/04/2022 3.0  1.7 - 7.7 K/uL Final   Lymphocytes Relative 07/04/2022 50  % Final   Lymphs Abs 07/04/2022 3.5  0.7 - 4.0 K/uL Final   Monocytes Relative 07/04/2022 7  % Final   Monocytes Absolute 07/04/2022 0.5  0.1 - 1.0 K/uL Final   Eosinophils Relative 07/04/2022 0  % Final   Eosinophils Absolute 07/04/2022 0.0  0.0 - 0.5 K/uL Final   Basophils Relative 07/04/2022 0  % Final   Basophils Absolute 07/04/2022 0.0  0.0 - 0.1 K/uL Final   Immature Granulocytes 07/04/2022 0  % Final   Abs Immature Granulocytes 07/04/2022 0.01  0.00 - 0.07 K/uL Final   Performed at [facility], [address], [place], [place] [address]   I-stat hCG, quantitative 07/04/2022 <5.0  <5 mIU/mL Final   Comment 3 07/04/2022          Final   Comment:   GEST. AGE      CONC.  (mIU/mL)   <=1 WEEK        5 - 50     2 WEEKS       50 - 500     3 WEEKS       100 - 10,000     4 WEEKS     1,000 - 30,000        FEMALE AND NON-PREGNANT FEMALE:     LESS THAN 5 mIU/mL   Admission on 06/14/2022, Discharged on 06/14/2022  Component Date Value Ref Range Status   Color, UA 06/14/2022 yellow  yellow Final   Clarity, UA 06/14/2022 cloudy (  A)  clear Final   Glucose, UA 06/14/2022 negative  negative mg/dL Final   Bilirubin, UA 16/07/9603 negative  negative Final   Ketones, POC UA 06/14/2022 negative  negative mg/dL Final   Spec Grav, UA 54/06/8118 1.025  1.010 - 1.025 Final   Blood, UA 06/14/2022 negative  negative Final   pH, UA 06/14/2022 7.5  5.0 - 8.0 Final   Protein Ur, POC 06/14/2022 =30 (A)  negative mg/dL Final   Urobilinogen, UA 06/14/2022 0.2  0.2 or 1.0 E.U./dL  Final   Nitrite, UA 14/78/2956 Negative  Negative Final   Leukocytes, UA 06/14/2022 Negative  Negative Final   Preg Test, Ur 06/14/2022 Negative  Negative Final   Specimen Description 06/14/2022 URINE, CLEAN CATCH   Final   Special Requests 06/14/2022    Final                   Value:NONE Performed at [facility], [address], [place], [place] [address]    Culture 06/14/2022 MULTIPLE SPECIES PRESENT, SUGGEST RECOLLECTION (A)   Final   Report Status 06/14/2022 06/16/2022 FINAL   Final  Admission on 06/07/2022, Discharged on 06/10/2022  Component Date Value Ref Range Status   SARS Coronavirus 2 by RT PCR 06/07/2022 NEGATIVE  NEGATIVE Final   Comment: (NOTE) SARS-CoV-2 target nucleic acids are NOT DETECTED.  The SARS-CoV-2 RNA is generally detectable in upper respiratory specimens during the acute phase of infection. The lowest concentration of SARS-CoV-2 viral copies this assay can detect is 138 copies/mL. A negative result does not preclude SARS-Cov-2 infection and should not be used as the sole basis for treatment or other patient management decisions. A negative result may occur with  improper specimen collection/handling, submission of specimen other than nasopharyngeal swab, presence of viral mutation(s) within the areas targeted by this assay, and inadequate number of viral copies(<138 copies/mL). A negative result must be combined with clinical observations, patient history, and epidemiological information. The expected result is Negative.  Fact Sheet for Patients:  BloggerCourse.com  Fact Sheet for Healthcare Providers:  SeriousBroker.it  This test is no                          t yet approved or cleared by the [place] FDA and  has been authorized for detection and/or diagnosis of SARS-CoV-2 by FDA under an Emergency Use Authorization (EUA). This EUA will remain  in effect (meaning this test can be used)  for the duration of the COVID-19 declaration under Section 564(b)(1) of the Act, 21 U.S.C.section 360bbb-3(b)(1), unless the authorization is terminated  or revoked sooner.       Influenza A by PCR 06/07/2022 NEGATIVE  NEGATIVE Final   Influenza B by PCR 06/07/2022 NEGATIVE  NEGATIVE Final   Comment: (NOTE) The Xpert Xpress SARS-CoV-2/FLU/RSV plus assay is intended as an aid in the diagnosis of influenza from Nasopharyngeal swab specimens and should not be used as a sole basis for treatment. Nasal washings and aspirates are unacceptable for Xpert Xpress SARS-CoV-2/FLU/RSV testing.  Fact Sheet for Patients: BloggerCourse.com  Fact Sheet for Healthcare Providers: SeriousBroker.it  This test is not yet approved or cleared by the [place] FDA and has been authorized for detection and/or diagnosis of SARS-CoV-2 by FDA under an Emergency Use Authorization (EUA). This EUA will remain in effect (meaning this test can be used) for the duration of the COVID-19 declaration under Section 564(b)(1) of the Act, 21 U.S.C. section  360bbb-3(b)(1), unless the authorization is terminated or revoked.  Performed at [facility], [address], [place], [place] [address]    WBC 06/07/2022 5.7  4.0 - 10.5 K/uL Final   RBC 06/07/2022 4.39  3.87 - 5.11 MIL/uL Final   Hemoglobin 06/07/2022 13.2  12.0 - 15.0 g/dL Final   HCT 42/59/5638 40.4  36.0 - 46.0 % Final   MCV 06/07/2022 92.0  80.0 - 100.0 fL Final   MCH 06/07/2022 30.1  26.0 - 34.0 pg Final   MCHC 06/07/2022 32.7  30.0 - 36.0 g/dL Final   RDW 75/64/3329 12.4  11.5 - 15.5 % Final   Platelets 06/07/2022 308  150 - 400 K/uL Final   nRBC 06/07/2022 0.0  0.0 - 0.2 % Final   Neutrophils Relative % 06/07/2022 42  % Final   Neutro Abs 06/07/2022 2.4  1.7 - 7.7 K/uL Final   Lymphocytes Relative 06/07/2022 54  % Final   Lymphs Abs 06/07/2022 3.1  0.7 - 4.0 K/uL Final   Monocytes Relative  06/07/2022 4  % Final   Monocytes Absolute 06/07/2022 0.2  0.1 - 1.0 K/uL Final   Eosinophils Relative 06/07/2022 0  % Final   Eosinophils Absolute 06/07/2022 0.0  0.0 - 0.5 K/uL Final   Basophils Relative 06/07/2022 0  % Final   Basophils Absolute 06/07/2022 0.0  0.0 - 0.1 K/uL Final   Immature Granulocytes 06/07/2022 0  % Final   Abs Immature Granulocytes 06/07/2022 0.01  0.00 - 0.07 K/uL Final   Performed at [facility], [address] [place], [place] [address]   Sodium 06/07/2022 139  135 - 145 mmol/L Final   Potassium 06/07/2022 4.0  3.5 - 5.1 mmol/L Final   Chloride 06/07/2022 104  98 - 111 mmol/L Final   CO2 06/07/2022 28  22 - 32 mmol/L Final   Glucose, Bld 06/07/2022 104 (H)  70 - 99 mg/dL Final   Glucose reference range applies only to samples taken after fasting for at least 8 hours.   BUN 06/07/2022 8  6 - 20 mg/dL Final   Creatinine, Ser 06/07/2022 0.84  0.44 - 1.00 mg/dL Final   Calcium 16/60/6301 9.1  8.9 - 10.3 mg/dL Final   Total Protein 60/07/9322 6.9  6.5 - 8.1 g/dL Final   Albumin 55/73/2202 3.7  3.5 - 5.0 g/dL Final   AST 54/27/0623 19  15 - 41 U/L Final   ALT 06/07/2022 22  0 - 44 U/L Final   Alkaline Phosphatase 06/07/2022 54  38 - 126 U/L Final   Total Bilirubin 06/07/2022 0.4  0.3 - 1.2 mg/dL Final   GFR, Estimated 06/07/2022 >60  >60 mL/min Final   Comment: (NOTE) Calculated using the CKD-EPI Creatinine Equation (2021)    Anion gap 06/07/2022 7  5 - 15 Final   Performed at [facility], [address], [place], [place] [address]   Hgb A1c MFr Bld 06/07/2022 5.0  4.8 - 5.6 % Final   Comment: (NOTE) Pre diabetes:          5.7%-6.4%  Diabetes:              >6.4%  Glycemic control for   <7.0% adults with diabetes    Mean Plasma Glucose 06/07/2022 96.8  mg/dL Final   Performed at [facility], [address], [place], [place] [address]   TSH 06/07/2022 1.620  0.350 - 4.500 uIU/mL Final   Comment: Performed by  a 3rd Generation assay  with a functional sensitivity of <=0.01 uIU/mL. Performed at [facility], [address] [place], [place] [address]    RPR Ser Ql 06/07/2022 NON REACTIVE  NON REACTIVE Final   Performed at [facility], [address] [place], [place] [address]   Color, Urine 06/07/2022 YELLOW  YELLOW Final   APPearance 06/07/2022 HAZY (A)  CLEAR Final   Specific Gravity, Urine 06/07/2022 1.018  1.005 - 1.030 Final   pH 06/07/2022 7.0  5.0 - 8.0 Final   Glucose, UA 06/07/2022 NEGATIVE  NEGATIVE mg/dL Final   Hgb urine dipstick 06/07/2022 NEGATIVE  NEGATIVE Final   Bilirubin Urine 06/07/2022 NEGATIVE  NEGATIVE Final   Ketones, ur 06/07/2022 NEGATIVE  NEGATIVE mg/dL Final   Protein, ur 82/95/6213 NEGATIVE  NEGATIVE mg/dL Final   Nitrite 08/65/7846 NEGATIVE  NEGATIVE Final   Leukocytes,Ua 06/07/2022 NEGATIVE  NEGATIVE Final   Performed at [facility], [address] [place], [place] [address]   Cholesterol 06/07/2022 164  0 - 200 mg/dL Final   Triglycerides 28/41/3244 158 (H)  <150 mg/dL Final   HDL 10/16/7251 44  >40 mg/dL Final   Total CHOL/HDL Ratio 06/07/2022 3.7  RATIO Final   VLDL 06/07/2022 32  0 - 40 mg/dL Final   LDL Cholesterol 06/07/2022 88  0 - 99 mg/dL Final   Comment:        Total Cholesterol/HDL:CHD Risk Coronary Heart Disease Risk Table                     Men   Women  1/2 Average Risk   3.4   3.3  Average Risk       5.0   4.4  2 X Average Risk   9.6   7.1  3 X Average Risk  23.4   11.0        Use the calculated Patient Ratio above and the CHD Risk Table to determine the patient's CHD Risk.        ATP III CLASSIFICATION (LDL):  <100     mg/dL   Optimal  664-403  mg/dL   Near or Above                    Optimal  130-159  mg/dL   Borderline  474-259  mg/dL   High  >563     mg/dL   Very High Performed at [facility], [address] [place], [place] [address]    HIV Screen 4th Generation wRfx 06/07/2022 Non Reactive  Non Reactive Final   Performed  at [facility], [address] [place], [place] [address]   SARSCOV2ONAVIRUS 2 AG 06/07/2022 NEGATIVE  NEGATIVE Final   Comment: (NOTE) SARS-CoV-2 antigen NOT DETECTED.   Negative results are presumptive.  Negative results do not preclude SARS-CoV-2 infection and should not be used as the sole basis for treatment or other patient management decisions, including infection  control decisions, particularly in the presence of clinical signs and  symptoms consistent with COVID-19, or in those who have been in contact with the virus.  Negative results must be combined with clinical observations, patient history, and epidemiological information. The expected result is Negative.  Fact Sheet for Patients: https://www.jennings-kim.com/  Fact Sheet for Healthcare Providers: https://alexander-rogers.biz/  This test is not yet approved or cleared by the [place] FDA and  has been authorized for detection and/or diagnosis of SARS-CoV-2 by  FDA under an Emergency Use Authorization (EUA).  This EUA will remain in effect (meaning this test can be used) for the duration of  the COV                          ID-19 declaration under Section 564(b)(1) of the Act, 21 U.S.C. section 360bbb-3(b)(1), unless the authorization is terminated or revoked sooner.     POC Amphetamine UR 06/07/2022 None Detected  NONE DETECTED (Cut Off Level 1000 ng/mL) Final   POC Secobarbital (BAR) 06/07/2022 None Detected  NONE DETECTED (Cut Off Level 300 ng/mL) Final   POC Buprenorphine (BUP) 06/07/2022 None Detected  NONE DETECTED (Cut Off Level 10 ng/mL) Final   POC Oxazepam (BZO) 06/07/2022 None Detected  NONE DETECTED (Cut Off Level 300 ng/mL) Final   POC Cocaine UR 06/07/2022 None Detected  NONE DETECTED (Cut Off Level 300 ng/mL) Final   POC Methamphetamine UR 06/07/2022 None Detected  NONE DETECTED (Cut Off Level 1000 ng/mL) Final   POC Morphine 06/07/2022 None Detected  NONE DETECTED  (Cut Off Level 300 ng/mL) Final   POC Methadone UR 06/07/2022 None Detected  NONE DETECTED (Cut Off Level 300 ng/mL) Final   POC Oxycodone UR 06/07/2022 None Detected  NONE DETECTED (Cut Off Level 100 ng/mL) Final   POC Marijuana UR 06/07/2022 None Detected  NONE DETECTED (Cut Off Level 50 ng/mL) Final   Preg Test, Ur 06/08/2022 NEGATIVE  NEGATIVE Final   Comment:        THE SENSITIVITY OF THIS METHODOLOGY IS >20 mIU/mL. Performed at [facility], [address], [place], [place] [address]    Preg Test, Ur 06/08/2022 NEGATIVE  NEGATIVE Final   Comment:        THE SENSITIVITY OF THIS METHODOLOGY IS >24 mIU/mL     Blood Alcohol level:  Lab Results  Component Value Date   ETH <10 07/04/2022   ETH <10 02/07/2022    Metabolic Disorder Labs: Lab Results  Component Value Date   HGBA1C 5.0 06/07/2022   MPG 96.8 06/07/2022   MPG 96.8 02/07/2022   No results found for: "PROLACTIN" Lab Results  Component Value Date   CHOL 181 07/25/2022   TRIG 118 07/25/2022   HDL 45 07/25/2022   CHOLHDL 4.0 07/25/2022   VLDL 24 07/25/2022   LDLCALC 112 (H) 07/25/2022   LDLCALC 88 06/07/2022    Therapeutic Lab Levels: No results found for: "LITHIUM" No results found for: "VALPROATE" No results found for: "CBMZ"  Physical Findings   GAD-7    Flowsheet Row Office Visit from 12/17/2021 in [facility]  Total GAD-7 Score 17      PHQ2-9    Flowsheet Row ED from 06/07/2022 in [facility] Office Visit from 12/17/2021 in [facility]  PHQ-2 Total Score 1 2  PHQ-9 Total Score 2 8      Flowsheet Row ED from 10/07/2022 in [facility] Most recent reading at 10/07/2022  8:16 PM ED from 10/07/2022 in [facility] EMERGENCY DEPARTMENT Most recent reading at 10/07/2022 12:18 PM ED from 07/25/2022 in [facility] Most recent reading at  10/06/2022  9:38 AM  C-SSRS RISK CATEGORY No Risk No Risk No Risk        Musculoskeletal  Strength & Muscle Tone: within normal limits Gait & Station: normal Patient leans: N/A  Psychiatric Specialty  Exam  Presentation  General Appearance:  Casual  Eye Contact: Good  Speech: Normal Rate; Clear and Coherent  Speech Volume: Normal  Handedness: Right   Mood and Affect  Mood: Euthymic  Affect: Congruent   Thought Process  Thought Processes: Coherent; Goal Directed; Linear  Descriptions of Associations:Intact  Orientation:Full (Time, Place and Person)  Thought Content:Logical  Diagnosis of Schizophrenia or Schizoaffective disorder in past: Yes    Hallucinations:Hallucinations: None   Ideas of Reference:None  Suicidal Thoughts:Suicidal Thoughts: No   Homicidal Thoughts:Homicidal Thoughts: No    Sensorium  Memory: Immediate Fair; Recent Fair; Remote Fair  Judgment: Fair  Insight: Fair   Art therapist  Concentration: Good  Attention Span: Good  Recall: Good  Fund of Knowledge: Good  Language: Good   Psychomotor Activity  Psychomotor Activity:Psychomotor Activity: Normal    Assets  Assets: Communication Skills; Desire for Improvement; Social Support   Sleep  Sleep: Sleep: Good   Physical Exam  Physical Exam Constitutional:      General: She is not in acute distress.    Appearance: Normal appearance. She is not ill-appearing.  HENT:     Head: Normocephalic and atraumatic.  Pulmonary:     Effort: Prolonged expiration present.  Musculoskeletal:        General: Normal range of motion.  Skin:    General: Skin is warm and dry.  Neurological:     Mental Status: She is alert and oriented to person, place, and time.  Psychiatric:        Attention and Perception: Attention and perception normal.        Mood and Affect: Mood and affect normal.        Speech: Speech normal.        Behavior: Behavior normal.  Behavior is cooperative.        Thought Content: Thought content normal.        Judgment: Judgment normal.    Review of Systems  Constitutional:  Negative for chills and fever.  Respiratory:  Negative for shortness of breath.   Cardiovascular:  Negative for chest pain and palpitations.  Gastrointestinal:  Negative for abdominal pain.  Genitourinary:  Negative for dysuria, flank pain and urgency.  Psychiatric/Behavioral:  Negative for depression, hallucinations, memory loss, substance abuse and suicidal ideas. The patient is not nervous/anxious and does not have insomnia.    Blood pressure 102/64, pulse 97, temperature 98.1 F (36.7 C), temperature source Oral, resp. rate 16, height 5\' 1"  (1.549 m), weight 198 lb (89.8 kg), SpO2 97 %. Body mass index is 37.41 kg/m.  Treatment Plan Summary: Plan Pt remains psychiatrically cleared. Awaiting placement in residential facility.  Lorri Frederick, MD 10/09/2022 11:45 AM

## 2022-10-10 NOTE — Care Management (Signed)
[facility]BS Care Management   Writer spoke to the patient father who reports that the patient mother who is the legal guardian signed the patient PCP plan and sent it back to the Care Coordinator with [place].   Writer reminded the patient of the patients Tx Team Meeting tomorrow at [place]

## 2022-10-10 NOTE — ED Provider Notes (Signed)
Behavioral Health Progress Note  Date and Time: 10/10/2022 10:31 AM Name: Nicole [name] MRN:  [id number]  Subjective:    Nicole [name] is a 31 y.o. female, with PMH of SCZ unspecified, MDD with psychosis, who presented voluntary to [facility] (07/25/2022) via [facility] after a verbal altercation with [name] (not patient's legal guardian) for transient SI. This is patient's fourth visit to [facility] for similar concerns this year. Patient has been dismissed from previous group home due to threatening SI and HI, then leaving the group home.   Patient evaluated on the unit, reports adequate sleep and appetite. Reports her mood is stable. Denies any suicidal ideation and contracts for safety on the unit. Reports she experienced visual hallucinations yesterday evening, describes seeing dinosaurs in the room and was given medication that helped her sleep.   On assessment, patient denies homicidal ideation. She denies auditory and visual hallucinations. Denies paranoid ideation. Denies thought insertion, thought withdrawal, and ideas of reference.   Diagnosis:  Final diagnoses:  Tobacco use disorder  Schizophrenia, unspecified type (HCC)  MDD (major depressive disorder), recurrent episode, moderate (HCC)  Hypothyroidism, unspecified type    Total Time spent with patient: 15 minutes  Past Psychiatric History: see HPI Past Medical History:  Past Medical History:  Diagnosis Date   Anxiety    Depression    Hypothyroidism 08/07/2022   Tobacco use disorder 08/07/2022    Past Surgical History:  Procedure Laterality Date   WISDOM TOOTH EXTRACTION Bilateral 2020   Family History:  Family History  Problem Relation Age of Onset   Hypertension Father    Diabetes Father    Family Psychiatric  History: None reported Social History:  Social History   Substance and Sexual Activity  Alcohol Use Never     Social History   Substance and Sexual Activity  Drug Use Never    Social History    Socioeconomic History   Marital status: Single    Spouse name: Not on file   Number of children: Not on file   Years of education: Not on file   Highest education level: Not on file  Occupational History   Not on file  Tobacco Use   Smoking status: Every Day    Types: Cigarettes   Smokeless tobacco: Never  Vaping Use   Vaping Use: Some days  Substance and Sexual Activity   Alcohol use: Never   Drug use: Never   Sexual activity: Yes    Partners: Female    Birth control/protection: Implant  Other Topics Concern   Not on file  Social History Narrative   Not on file   Social Determinants of Health   Financial Resource Strain: Not on file  Food Insecurity: Not on file  Transportation Needs: Not on file  Physical Activity: Not on file  Stress: Not on file  Social Connections: Not on file   SDOH:  SDOH Screenings   Depression (PHQ2-9): Low Risk  (06/10/2022)  Tobacco Use: High Risk (10/07/2022)   Additional Social History: N/A  Sleep: Good  Appetite:  Good  Current Medications:  Current Facility-Administered Medications  Medication Dose Route Frequency Provider Last Rate Last Admin   acetaminophen (TYLENOL) tablet 650 mg  650 mg Oral Q6H PRN Marlou Sa, NP       alum & mag hydroxide-simeth (MAALOX/MYLANTA) 200-200-20 MG/5ML suspension 30 mL  30 mL Oral Q4H PRN Rayburn Go, Veronique M, NP   30 mL at 10/07/22 2303   docusate sodium (COLACE) capsule 100 mg  100 mg Oral Daily Carrion-Carrero, Acelynn Dejonge, MD   100 mg at 10/10/22 1022   hydrOXYzine (ATARAX) tablet 25 mg  25 mg Oral TID PRN Lorri Frederick, MD   25 mg at 10/09/22 2126   levothyroxine (SYNTHROID) tablet 100 mcg  100 mcg Oral Once Byungura, Veronique M, NP       levothyroxine (SYNTHROID) tablet 100 mcg  100 mcg Oral Q0600 Marlou Sa, NP   100 mcg at 10/10/22 1610   loratadine (CLARITIN) tablet 10 mg  10 mg Oral Daily Carrion-Carrero, Karle Starch, MD   10 mg at 10/10/22 1022   magnesium  hydroxide (MILK OF MAGNESIA) suspension 30 mL  30 mL Oral Daily PRN Marlou Sa, NP       metFORMIN (GLUCOPHAGE) tablet 500 mg  500 mg Oral Q breakfast Carrion-Carrero, Shannon Balthazar, MD   500 mg at 10/10/22 1020   nicotine (NICODERM CQ - dosed in mg/24 hours) patch 14 mg  14 mg Transdermal Daily PRN Carrion-Carrero, Merelin Human, MD       nitrofurantoin (macrocrystal-monohydrate) (MACROBID) capsule 100 mg  100 mg Oral Q12H Rayburn Go, Veronique M, NP   100 mg at 10/10/22 1018   ondansetron (ZOFRAN-ODT) disintegrating tablet 4 mg  4 mg Oral Q8H PRN Carrion-Carrero, Karle Starch, MD   4 mg at 10/09/22 2252   Oxcarbazepine (TRILEPTAL) tablet 300 mg  300 mg Oral BID Byungura, Veronique M, NP   300 mg at 10/10/22 1021   pantoprazole (PROTONIX) EC tablet 40 mg  40 mg Oral Daily Carrion-Carrero, Aurelio Mccamy, MD   40 mg at 10/10/22 1022   QUEtiapine (SEROQUEL) tablet 400 mg  400 mg Oral BID Byungura, Veronique M, NP   400 mg at 10/10/22 1021   sertraline (ZOLOFT) tablet 150 mg  150 mg Oral Daily Carrion-Carrero, Lichelle Viets, MD   150 mg at 10/10/22 1019   traZODone (DESYREL) tablet 100 mg  100 mg Oral QHS Rayburn Go, Veronique M, NP   100 mg at 10/09/22 2125   traZODone (DESYREL) tablet 50 mg  50 mg Oral QHS PRN Marlou Sa, NP       valACYclovir (VALTREX) tablet 500 mg  500 mg Oral Daily Carrion-Carrero, Reginna Sermeno, MD   500 mg at 10/10/22 1019   Current Outpatient Medications  Medication Sig Dispense Refill   ABILIFY MAINTENA 400 MG PRSY prefilled syringe 400 mg every 28 (twenty-eight) days.     cetirizine (ZYRTEC) 10 MG tablet Take 10 mg by mouth daily.     cyclobenzaprine (FLEXERIL) 10 MG tablet Take 1 tablet (10 mg total) by mouth 2 (two) times daily as needed for muscle spasms. 20 tablet 0   fluticasone (FLONASE) 50 MCG/ACT nasal spray Place 1 spray into both nostrils daily.     meloxicam (MOBIC) 15 MG tablet Take 15 mg by mouth daily.     nitrofurantoin, macrocrystal-monohydrate, (MACROBID) 100 MG capsule  Take 1 capsule (100 mg total) by mouth 2 (two) times daily. 10 capsule 0   ondansetron (ZOFRAN-ODT) 4 MG disintegrating tablet Take 1 tablet (4 mg total) by mouth every 8 (eight) hours as needed for nausea or vomiting. 20 tablet 0   Oxcarbazepine (TRILEPTAL) 300 MG tablet Take 1 tablet (300 mg total) by mouth 2 (two) times daily. 60 tablet 0   QUEtiapine (SEROQUEL) 400 MG tablet Take 1 tablet (400 mg total) by mouth 2 (two) times daily. 60 tablet 0   sertraline (ZOLOFT) 50 MG tablet Take 3 tablets (150 mg total) by mouth in the morning. 90 tablet 0  traZODone (DESYREL) 100 MG tablet Take 1 tablet (100 mg total) by mouth at bedtime. 30 tablet 0   valACYclovir (VALTREX) 500 MG tablet Take 500 mg by mouth daily.      Labs  Lab Results:  Admission on 10/07/2022, Discharged on 10/07/2022  Component Date Value Ref Range Status   Color, Urine 10/07/2022 YELLOW  YELLOW Final   APPearance 10/07/2022 HAZY (A)  CLEAR Final   Specific Gravity, Urine 10/07/2022 1.011  1.005 - 1.030 Final   pH 10/07/2022 7.0  5.0 - 8.0 Final   Glucose, UA 10/07/2022 NEGATIVE  NEGATIVE mg/dL Final   Hgb urine dipstick 10/07/2022 SMALL (A)  NEGATIVE Final   Bilirubin Urine 10/07/2022 NEGATIVE  NEGATIVE Final   Ketones, ur 10/07/2022 NEGATIVE  NEGATIVE mg/dL Final   Protein, ur 13/05/6577 NEGATIVE  NEGATIVE mg/dL Final   Nitrite 46/96/2952 NEGATIVE  NEGATIVE Final   Leukocytes,Ua 10/07/2022 LARGE (A)  NEGATIVE Final   RBC / HPF 10/07/2022 0-5  0 - 5 RBC/hpf Final   WBC, UA 10/07/2022 0-5  0 - 5 WBC/hpf Final   Bacteria, UA 10/07/2022 FEW (A)  NONE SEEN Final   Squamous Epithelial / LPF 10/07/2022 11-20  0 - 5 Final   Performed at [facility], [address] [place], [place] [address]   I-stat hCG, quantitative 10/07/2022 <5.0  <5 mIU/mL Final   Comment 3 10/07/2022          Final   Comment:   GEST. AGE      CONC.  (mIU/mL)   <=1 WEEK        5 - 50     2 WEEKS       50 - 500     3 WEEKS       100 - 10,000      4 WEEKS     1,000 - 30,000        FEMALE AND NON-PREGNANT FEMALE:     LESS THAN 5 mIU/mL    Sodium 10/07/2022 138  135 - 145 mmol/L Final   Potassium 10/07/2022 4.3  3.5 - 5.1 mmol/L Final   Chloride 10/07/2022 104  98 - 111 mmol/L Final   CO2 10/07/2022 28  22 - 32 mmol/L Final   Glucose, Bld 10/07/2022 90  70 - 99 mg/dL Final   Glucose reference range applies only to samples taken after fasting for at least 8 hours.   BUN 10/07/2022 8  6 - 20 mg/dL Final   Creatinine, Ser 10/07/2022 0.96  0.44 - 1.00 mg/dL Final   Calcium 44/10/270 9.1  8.9 - 10.3 mg/dL Final   GFR, Estimated 10/07/2022 >60  >60 mL/min Final   Comment: (NOTE) Calculated using the CKD-EPI Creatinine Equation (2021)    Anion gap 10/07/2022 6  5 - 15 Final   Performed at [facility], [address] [place], [place] [address]   WBC 10/07/2022 5.8  4.0 - 10.5 K/uL Final   RBC 10/07/2022 4.36  3.87 - 5.11 MIL/uL Final   Hemoglobin 10/07/2022 12.9  12.0 - 15.0 g/dL Final   HCT 40/34/7425 40.0  36.0 - 46.0 % Final   MCV 10/07/2022 91.7  80.0 - 100.0 fL Final   MCH 10/07/2022 29.6  26.0 - 34.0 pg Final   MCHC 10/07/2022 32.3  30.0 - 36.0 g/dL Final   RDW 95/63/8756 12.4  11.5 - 15.5 % Final   Platelets 10/07/2022 295  150 - 400 K/uL Final  nRBC 10/07/2022 0.0  0.0 - 0.2 % Final   Performed at [facility], [address] [place], [place] [address]   SARS Coronavirus 2 by RT PCR 10/07/2022 NEGATIVE  NEGATIVE Final   Comment: (NOTE) SARS-CoV-2 target nucleic acids are NOT DETECTED.  The SARS-CoV-2 RNA is generally detectable in upper respiratory specimens during the acute phase of infection. The lowest concentration of SARS-CoV-2 viral copies this assay can detect is 138 copies/mL. A negative result does not preclude SARS-Cov-2 infection and should not be used as the sole basis for treatment or other patient management decisions. A negative result may occur with  improper specimen collection/handling,  submission of specimen other than nasopharyngeal swab, presence of viral mutation(s) within the areas targeted by this assay, and inadequate number of viral copies(<138 copies/mL). A negative result must be combined with clinical observations, patient history, and epidemiological information. The expected result is Negative.  Fact Sheet for Patients:  BloggerCourse.com  Fact Sheet for Healthcare Providers:  SeriousBroker.it  This test is no                          t yet approved or cleared by the [place] FDA and  has been authorized for detection and/or diagnosis of SARS-CoV-2 by FDA under an Emergency Use Authorization (EUA). This EUA will remain  in effect (meaning this test can be used) for the duration of the COVID-19 declaration under Section 564(b)(1) of the Act, 21 U.S.C.section 360bbb-3(b)(1), unless the authorization is terminated  or revoked sooner.       Influenza A by PCR 10/07/2022 NEGATIVE  NEGATIVE Final   Influenza B by PCR 10/07/2022 NEGATIVE  NEGATIVE Final   Comment: (NOTE) The Xpert Xpress SARS-CoV-2/FLU/RSV plus assay is intended as an aid in the diagnosis of influenza from Nasopharyngeal swab specimens and should not be used as a sole basis for treatment. Nasal washings and aspirates are unacceptable for Xpert Xpress SARS-CoV-2/FLU/RSV testing.  Fact Sheet for Patients: BloggerCourse.com  Fact Sheet for Healthcare Providers: SeriousBroker.it  This test is not yet approved or cleared by the [place] FDA and has been authorized for detection and/or diagnosis of SARS-CoV-2 by FDA under an Emergency Use Authorization (EUA). This EUA will remain in effect (meaning this test can be used) for the duration of the COVID-19 declaration under Section 564(b)(1) of the Act, 21 U.S.C. section 360bbb-3(b)(1), unless the authorization is terminated  or revoked.     Resp Syncytial Virus by PCR 10/07/2022 NEGATIVE  NEGATIVE Final   Comment: (NOTE) Fact Sheet for Patients: BloggerCourse.com  Fact Sheet for Healthcare Providers: SeriousBroker.it  This test is not yet approved or cleared by the [place] FDA and has been authorized for detection and/or diagnosis of SARS-CoV-2 by FDA under an Emergency Use Authorization (EUA). This EUA will remain in effect (meaning this test can be used) for the duration of the COVID-19 declaration under Section 564(b)(1) of the Act, 21 U.S.C. section 360bbb-3(b)(1), unless the authorization is terminated or revoked.  Performed at [facility], [address], [place], [place] [address]    Specimen Description 10/07/2022 URINE, CLEAN CATCH   Final   Special Requests 10/07/2022    Final                   Value:NONE Performed at [facility], [address], [place], [place] [address]    Culture 10/07/2022 MULTIPLE SPECIES PRESENT, SUGGEST RECOLLECTION (A)  Final   Report Status 10/07/2022 10/08/2022 FINAL   Final  Admission on 07/25/2022, Discharged on 10/07/2022  Component Date Value Ref Range Status   SARS Coronavirus 2 by RT PCR 07/25/2022 NEGATIVE  NEGATIVE Final   Comment: (NOTE) SARS-CoV-2 target nucleic acids are NOT DETECTED.  The SARS-CoV-2 RNA is generally detectable in upper respiratory specimens during the acute phase of infection. The lowest concentration of SARS-CoV-2 viral copies this assay can detect is 138 copies/mL. A negative result does not preclude SARS-Cov-2 infection and should not be used as the sole basis for treatment or other patient management decisions. A negative result may occur with  improper specimen collection/handling, submission of specimen other than nasopharyngeal swab, presence of viral mutation(s) within the areas targeted by this assay, and inadequate number of viral copies(<138  copies/mL). A negative result must be combined with clinical observations, patient history, and epidemiological information. The expected result is Negative.  Fact Sheet for Patients:  BloggerCourse.com  Fact Sheet for Healthcare Providers:  SeriousBroker.it  This test is no                          t yet approved or cleared by the [place] FDA and  has been authorized for detection and/or diagnosis of SARS-CoV-2 by FDA under an Emergency Use Authorization (EUA). This EUA will remain  in effect (meaning this test can be used) for the duration of the COVID-19 declaration under Section 564(b)(1) of the Act, 21 U.S.C.section 360bbb-3(b)(1), unless the authorization is terminated  or revoked sooner.       Influenza A by PCR 07/25/2022 NEGATIVE  NEGATIVE Final   Influenza B by PCR 07/25/2022 NEGATIVE  NEGATIVE Final   Comment: (NOTE) The Xpert Xpress SARS-CoV-2/FLU/RSV plus assay is intended as an aid in the diagnosis of influenza from Nasopharyngeal swab specimens and should not be used as a sole basis for treatment. Nasal washings and aspirates are unacceptable for Xpert Xpress SARS-CoV-2/FLU/RSV testing.  Fact Sheet for Patients: BloggerCourse.com  Fact Sheet for Healthcare Providers: SeriousBroker.it  This test is not yet approved or cleared by the [place] FDA and has been authorized for detection and/or diagnosis of SARS-CoV-2 by FDA under an Emergency Use Authorization (EUA). This EUA will remain in effect (meaning this test can be used) for the duration of the COVID-19 declaration under Section 564(b)(1) of the Act, 21 U.S.C. section 360bbb-3(b)(1), unless the authorization is terminated or revoked.  Performed at [facility], [address] [place], [place] [address]    WBC 07/25/2022 8.3  4.0 - 10.5 K/uL Final   RBC 07/25/2022 4.44  3.87 - 5.11 MIL/uL  Final   Hemoglobin 07/25/2022 13.7  12.0 - 15.0 g/dL Final   HCT 60/45/4098 40.2  36.0 - 46.0 % Final   MCV 07/25/2022 90.5  80.0 - 100.0 fL Final   MCH 07/25/2022 30.9  26.0 - 34.0 pg Final   MCHC 07/25/2022 34.1  30.0 - 36.0 g/dL Final   RDW 11/91/4782 12.2  11.5 - 15.5 % Final   Platelets 07/25/2022 248  150 - 400 K/uL Final   nRBC 07/25/2022 0.0  0.0 - 0.2 % Final   Neutrophils Relative % 07/25/2022 43  % Final   Neutro Abs 07/25/2022 3.6  1.7 - 7.7 K/uL Final   Lymphocytes Relative 07/25/2022 52  % Final   Lymphs Abs 07/25/2022 4.2 (H)  0.7 - 4.0 K/uL Final   Monocytes Relative 07/25/2022 5  %  Final   Monocytes Absolute 07/25/2022 0.4  0.1 - 1.0 K/uL Final   Eosinophils Relative 07/25/2022 0  % Final   Eosinophils Absolute 07/25/2022 0.0  0.0 - 0.5 K/uL Final   Basophils Relative 07/25/2022 0  % Final   Basophils Absolute 07/25/2022 0.0  0.0 - 0.1 K/uL Final   Immature Granulocytes 07/25/2022 0  % Final   Abs Immature Granulocytes 07/25/2022 0.02  0.00 - 0.07 K/uL Final   Performed at [facility], [address], [place], [place] [address]   Sodium 07/25/2022 138  135 - 145 mmol/L Final   Potassium 07/25/2022 4.0  3.5 - 5.1 mmol/L Final   Chloride 07/25/2022 104  98 - 111 mmol/L Final   CO2 07/25/2022 29  22 - 32 mmol/L Final   Glucose, Bld 07/25/2022 83  70 - 99 mg/dL Final   Glucose reference range applies only to samples taken after fasting for at least 8 hours.   BUN 07/25/2022 11  6 - 20 mg/dL Final   Creatinine, Ser 07/25/2022 0.97  0.44 - 1.00 mg/dL Final   Calcium 40/98/1191 9.2  8.9 - 10.3 mg/dL Final   Total Protein 47/82/9562 7.0  6.5 - 8.1 g/dL Final   Albumin 13/05/6577 3.8  3.5 - 5.0 g/dL Final   AST 46/96/2952 18  15 - 41 U/L Final   ALT 07/25/2022 22  0 - 44 U/L Final   Alkaline Phosphatase 07/25/2022 64  38 - 126 U/L Final   Total Bilirubin 07/25/2022 0.2 (L)  0.3 - 1.2 mg/dL Final   GFR, Estimated 07/25/2022 >60  >60 mL/min Final   Comment:  (NOTE) Calculated using the CKD-EPI Creatinine Equation (2021)    Anion gap 07/25/2022 5  5 - 15 Final   Performed at [facility], [address] [place], [place] [address]   POC Amphetamine UR 07/25/2022 None Detected  NONE DETECTED (Cut Off Level 1000 ng/mL) Preliminary   POC Secobarbital (BAR) 07/25/2022 None Detected  NONE DETECTED (Cut Off Level 300 ng/mL) Preliminary   POC Buprenorphine (BUP) 07/25/2022 None Detected  NONE DETECTED (Cut Off Level 10 ng/mL) Preliminary   POC Oxazepam (BZO) 07/25/2022 None Detected  NONE DETECTED (Cut Off Level 300 ng/mL) Preliminary   POC Cocaine UR 07/25/2022 None Detected  NONE DETECTED (Cut Off Level 300 ng/mL) Preliminary   POC Methamphetamine UR 07/25/2022 None Detected  NONE DETECTED (Cut Off Level 1000 ng/mL) Preliminary   POC Morphine 07/25/2022 None Detected  NONE DETECTED (Cut Off Level 300 ng/mL) Preliminary   POC Methadone UR 07/25/2022 None Detected  NONE DETECTED (Cut Off Level 300 ng/mL) Preliminary   POC Oxycodone UR 07/25/2022 None Detected  NONE DETECTED (Cut Off Level 100 ng/mL) Preliminary   POC Marijuana UR 07/25/2022 None Detected  NONE DETECTED (Cut Off Level 50 ng/mL) Preliminary   SARSCOV2ONAVIRUS 2 AG 07/25/2022 NEGATIVE  NEGATIVE Final   Comment: (NOTE) SARS-CoV-2 antigen NOT DETECTED.   Negative results are presumptive.  Negative results do not preclude SARS-CoV-2 infection and should not be used as the sole basis for treatment or other patient management decisions, including infection  control decisions, particularly in the presence of clinical signs and  symptoms consistent with COVID-19, or in those who have been in contact with the virus.  Negative results must be combined with clinical observations, patient history, and epidemiological information. The expected result is Negative.  Fact Sheet for Patients: https://www.jennings-kim.com/  Fact Sheet for Healthcare  Providers: https://alexander-rogers.biz/  This test  is not yet approved or cleared by the [place] and  has been authorized for detection and/or diagnosis of SARS-CoV-2 by FDA under an Emergency Use Authorization (EUA).  This EUA will remain in effect (meaning this test can be used) for the duration of  the COV                          ID-19 declaration under Section 564(b)(1) of the Act, 21 U.S.C. section 360bbb-3(b)(1), unless the authorization is terminated or revoked sooner.     Cholesterol 07/25/2022 181  0 - 200 mg/dL Final   Triglycerides 24/58/0998 118  <150 mg/dL Final   HDL 33/82/5053 45  >40 mg/dL Final   Total CHOL/HDL Ratio 07/25/2022 4.0  RATIO Final   VLDL 07/25/2022 24  0 - 40 mg/dL Final   LDL Cholesterol 07/25/2022 112 (H)  0 - 99 mg/dL Final   Comment:        Total Cholesterol/HDL:CHD Risk Coronary Heart Disease Risk Table                     Men   Women  1/2 Average Risk   3.4   3.3  Average Risk       5.0   4.4  2 X Average Risk   9.6   7.1  3 X Average Risk  23.4   11.0        Use the calculated Patient Ratio above and the CHD Risk Table to determine the patient's CHD Risk.        ATP III CLASSIFICATION (LDL):  <100     mg/dL   Optimal  976-734  mg/dL   Near or Above                    Optimal  130-159  mg/dL   Borderline  193-790  mg/dL   High  >240     mg/dL   Very High Performed at [facility], [address] [place], [place] [address]    TSH 07/25/2022 6.668 (H)  0.350 - 4.500 uIU/mL Final   Comment: Performed by a 3rd Generation assay with a functional sensitivity of <=0.01 uIU/mL. Performed at [facility], [address], [place], [place] [address]    Glucose-Capillary 07/26/2022 104 (H)  70 - 99 mg/dL Final   Glucose reference range applies only to samples taken after fasting for at least 8 hours.   T3, Free 07/31/2022 2.3  2.0 - 4.4 pg/mL Final   Comment: (NOTE) Performed At: [facility] [address] [place], [place] [id number] Jolene Schimke MD IW:[id number]    Free T4 07/31/2022 0.60 (L)  0.61 - 1.12 ng/dL Final   Comment: (NOTE) Biotin ingestion may interfere with free T4 tests. If the results are inconsistent with the TSH level, previous test results, or the clinical presentation, then consider biotin interference. If needed, order repeat testing after stopping biotin. Performed at [facility], [address], [place], [place] [address]    Glucose-Capillary 08/29/2022 100 (H)  70 - 99 mg/dL Final   Glucose reference range applies only to samples taken after fasting for at least 8 hours.   TSH 09/05/2022 0.793  0.350 - 4.500 uIU/mL Final   Comment: Performed by a 3rd Generation assay with a functional sensitivity of <=0.01 uIU/mL. Performed at [facility], [address], [place], [place] [address]  Free T4 09/05/2022 0.73  0.61 - 1.12 ng/dL Final   Comment: (NOTE) Biotin ingestion may interfere with free T4 tests. If the results are inconsistent with the TSH level, previous test results, or the clinical presentation, then consider biotin interference. If needed, order repeat testing after stopping biotin. Performed at [facility], [address] [place], [place] [address]    Preg Test, Ur 09/06/2022 Negative  Negative Final   Preg Test, Ur 09/06/2022 NEGATIVE  NEGATIVE Final   Comment:        THE SENSITIVITY OF THIS METHODOLOGY IS >24 mIU/mL    Preg Test, Ur 09/05/2022 NEGATIVE  NEGATIVE Final   Comment:        THE SENSITIVITY OF THIS METHODOLOGY IS >24 mIU/mL    Sodium 09/13/2022 136  135 - 145 mmol/L Final   Potassium 09/13/2022 4.5  3.5 - 5.1 mmol/L Final   Chloride 09/13/2022 105  98 - 111 mmol/L Final   CO2 09/13/2022 21 (L)  22 - 32 mmol/L Final   Glucose, Bld 09/13/2022 111 (H)  70 - 99 mg/dL Final   Glucose reference range applies only to samples taken after fasting for at least 8 hours.   BUN 09/13/2022 14  6 - 20 mg/dL  Final   Creatinine, Ser 09/13/2022 0.92  0.44 - 1.00 mg/dL Final   Calcium 60/45/4098 9.1  8.9 - 10.3 mg/dL Final   GFR, Estimated 09/13/2022 >60  >60 mL/min Final   Comment: (NOTE) Calculated using the CKD-EPI Creatinine Equation (2021)    Anion gap 09/13/2022 10  5 - 15 Final   Performed at [facility], [address] [place], [place] [address]   Vitamin B-12 09/13/2022 585  180 - 914 pg/mL Final   Comment: (NOTE) This assay is not validated for testing neonatal or myeloproliferative syndrome specimens for Vitamin B12 levels. Performed at [facility], [address] [place], [place] [address]    Color, Urine 09/14/2022 YELLOW  YELLOW Final   APPearance 09/14/2022 HAZY (A)  CLEAR Final   Specific Gravity, Urine 09/14/2022 1.025  1.005 - 1.030 Final   pH 09/14/2022 5.0  5.0 - 8.0 Final   Glucose, UA 09/14/2022 NEGATIVE  NEGATIVE mg/dL Final   Hgb urine dipstick 09/14/2022 NEGATIVE  NEGATIVE Final   Bilirubin Urine 09/14/2022 NEGATIVE  NEGATIVE Final   Ketones, ur 09/14/2022 NEGATIVE  NEGATIVE mg/dL Final   Protein, ur 62/13/0865 NEGATIVE  NEGATIVE mg/dL Final   Nitrite 78/46/9629 NEGATIVE  NEGATIVE Final   Leukocytes,Ua 09/14/2022 TRACE (A)  NEGATIVE Final   RBC / HPF 09/14/2022 0-5  0 - 5 RBC/hpf Final   WBC, UA 09/14/2022 0-5  0 - 5 WBC/hpf Final   Bacteria, UA 09/14/2022 RARE (A)  NONE SEEN Final   Squamous Epithelial / LPF 09/14/2022 0-5  0 - 5 Final   Mucus 09/14/2022 PRESENT   Final   Performed at [facility], [address] [place], [place] [address]   Glucose-Capillary 09/16/2022 105 (H)  70 - 99 mg/dL Final   Glucose reference range applies only to samples taken after fasting for at least 8 hours.   SARS Coronavirus 2 by RT PCR 09/30/2022 NEGATIVE  NEGATIVE Final   Comment: (NOTE) SARS-CoV-2 target nucleic acids are NOT DETECTED.  The SARS-CoV-2 RNA is generally detectable in upper respiratory specimens during the acute phase of infection. The  lowest concentration of SARS-CoV-2 viral copies this assay can detect is 138 copies/mL. A  negative result does not preclude SARS-Cov-2 infection and should not be used as the sole basis for treatment or other patient management decisions. A negative result may occur with  improper specimen collection/handling, submission of specimen other than nasopharyngeal swab, presence of viral mutation(s) within the areas targeted by this assay, and inadequate number of viral copies(<138 copies/mL). A negative result must be combined with clinical observations, patient history, and epidemiological information. The expected result is Negative.  Fact Sheet for Patients:  BloggerCourse.com  Fact Sheet for Healthcare Providers:  SeriousBroker.it  This test is no                          t yet approved or cleared by the [place] FDA and  has been authorized for detection and/or diagnosis of SARS-CoV-2 by FDA under an Emergency Use Authorization (EUA). This EUA will remain  in effect (meaning this test can be used) for the duration of the COVID-19 declaration under Section 564(b)(1) of the Act, 21 U.S.C.section 360bbb-3(b)(1), unless the authorization is terminated  or revoked sooner.       Influenza A by PCR 09/30/2022 NEGATIVE  NEGATIVE Final   Influenza B by PCR 09/30/2022 NEGATIVE  NEGATIVE Final   Comment: (NOTE) The Xpert Xpress SARS-CoV-2/FLU/RSV plus assay is intended as an aid in the diagnosis of influenza from Nasopharyngeal swab specimens and should not be used as a sole basis for treatment. Nasal washings and aspirates are unacceptable for Xpert Xpress SARS-CoV-2/FLU/RSV testing.  Fact Sheet for Patients: BloggerCourse.com  Fact Sheet for Healthcare Providers: SeriousBroker.it  This test is not yet approved or cleared by the [place] FDA and has been authorized for  detection and/or diagnosis of SARS-CoV-2 by FDA under an Emergency Use Authorization (EUA). This EUA will remain in effect (meaning this test can be used) for the duration of the COVID-19 declaration under Section 564(b)(1) of the Act, 21 U.S.C. section 360bbb-3(b)(1), unless the authorization is terminated or revoked.     Resp Syncytial Virus by PCR 09/30/2022 NEGATIVE  NEGATIVE Final   Comment: (NOTE) Fact Sheet for Patients: BloggerCourse.com  Fact Sheet for Healthcare Providers: SeriousBroker.it  This test is not yet approved or cleared by the [place] FDA and has been authorized for detection and/or diagnosis of SARS-CoV-2 by FDA under an Emergency Use Authorization (EUA). This EUA will remain in effect (meaning this test can be used) for the duration of the COVID-19 declaration under Section 564(b)(1) of the Act, 21 U.S.C. section 360bbb-3(b)(1), unless the authorization is terminated or revoked.  Performed at [facility], [address], [place], [place] [address]    Sodium 10/01/2022 136  135 - 145 mmol/L Final   Potassium 10/01/2022 3.8  3.5 - 5.1 mmol/L Final   Chloride 10/01/2022 104  98 - 111 mmol/L Final   CO2 10/01/2022 27  22 - 32 mmol/L Final   Glucose, Bld 10/01/2022 98  70 - 99 mg/dL Final   Glucose reference range applies only to samples taken after fasting for at least 8 hours.   BUN 10/01/2022 12  6 - 20 mg/dL Final   Creatinine, Ser 10/01/2022 0.98  0.44 - 1.00 mg/dL Final   Calcium 60/45/4098 8.9  8.9 - 10.3 mg/dL Final   GFR, Estimated 10/01/2022 >60  >60 mL/min Final   Comment: (NOTE) Calculated using the CKD-EPI Creatinine Equation (2021)    Anion gap 10/01/2022 5  5 - 15 Final   Performed at  [facility], [address] [place] [place], [place] [address]   WBC 10/01/2022 5.1  4.0 - 10.5 K/uL Final   RBC 10/01/2022 4.31  3.87 - 5.11 MIL/uL Final   Hemoglobin 10/01/2022 12.7  12.0 - 15.0  g/dL Final   HCT 60/45/4098 38.8  36.0 - 46.0 % Final   MCV 10/01/2022 90.0  80.0 - 100.0 fL Final   MCH 10/01/2022 29.5  26.0 - 34.0 pg Final   MCHC 10/01/2022 32.7  30.0 - 36.0 g/dL Final   RDW 11/91/4782 12.4  11.5 - 15.5 % Final   Platelets 10/01/2022 300  150 - 400 K/uL Final   nRBC 10/01/2022 0.0  0.0 - 0.2 % Final   Performed at [facility], [address] [place], [place] [address]   Lipase 10/01/2022 29  11 - 51 U/L Final   Performed at [facility], [address] [place], [place] [address]   Color, Urine 10/05/2022 YELLOW  YELLOW Final   APPearance 10/05/2022 HAZY (A)  CLEAR Final   Specific Gravity, Urine 10/05/2022 1.018  1.005 - 1.030 Final   pH 10/05/2022 5.0  5.0 - 8.0 Final   Glucose, UA 10/05/2022 NEGATIVE  NEGATIVE mg/dL Final   Hgb urine dipstick 10/05/2022 NEGATIVE  NEGATIVE Final   Bilirubin Urine 10/05/2022 NEGATIVE  NEGATIVE Final   Ketones, ur 10/05/2022 NEGATIVE  NEGATIVE mg/dL Final   Protein, ur 78/46/9629 NEGATIVE  NEGATIVE mg/dL Final   Nitrite 52/84/1324 NEGATIVE  NEGATIVE Final   Leukocytes,Ua 10/05/2022 TRACE (A)  NEGATIVE Final   RBC / HPF 10/05/2022 0-5  0 - 5 RBC/hpf Final   WBC, UA 10/05/2022 0-5  0 - 5 WBC/hpf Final   Bacteria, UA 10/05/2022 RARE (A)  NONE SEEN Final   Squamous Epithelial / LPF 10/05/2022 0-5  0 - 5 Final   Mucus 10/05/2022 PRESENT   Final   Performed at [facility], [address] [place], [place] [address]   Specimen Description 10/05/2022 URINE, CLEAN CATCH   Final   Special Requests 10/05/2022    Final                   Value:NONE Performed at [facility], [address] [place], [place] [address]    Culture 10/05/2022 10,000 COLONIES/mL MULTIPLE SPECIES PRESENT, SUGGEST RECOLLECTION (A)   Final   Report Status 10/05/2022 10/07/2022 FINAL   Final  Admission on 07/04/2022, Discharged on 07/05/2022  Component Date Value Ref Range Status   Sodium 07/04/2022 142  135 - 145 mmol/L Final    Potassium 07/04/2022 4.4  3.5 - 5.1 mmol/L Final   Chloride 07/04/2022 109  98 - 111 mmol/L Final   CO2 07/04/2022 26  22 - 32 mmol/L Final   Glucose, Bld 07/04/2022 96  70 - 99 mg/dL Final   Glucose reference range applies only to samples taken after fasting for at least 8 hours.   BUN 07/04/2022 15  6 - 20 mg/dL Final   Creatinine, Ser 07/04/2022 0.83  0.44 - 1.00 mg/dL Final   Calcium 64/40/3474 9.3  8.9 - 10.3 mg/dL Final   Total Protein 25/95/6387 7.2  6.5 - 8.1 g/dL Final   Albumin 56/43/3295 3.9  3.5 - 5.0 g/dL Final   AST 18/84/1660 23  15 - 41 U/L Final   ALT 07/04/2022 28  0 - 44 U/L Final   Alkaline Phosphatase 07/04/2022 77  38 - 126 U/L Final  Total Bilirubin 07/04/2022 0.4  0.3 - 1.2 mg/dL Final   GFR, Estimated 07/04/2022 >60  >60 mL/min Final   Comment: (NOTE) Calculated using the CKD-EPI Creatinine Equation (2021)    Anion gap 07/04/2022 7  5 - 15 Final   Performed at [facility], [address], [place], [place] [address]   Alcohol, Ethyl (B) 07/04/2022 <10  <10 mg/dL Final   Comment: (NOTE) Lowest detectable limit for serum alcohol is 10 mg/dL.  For medical purposes only. Performed at [facility], [address] [place], [place] [address]    WBC 07/04/2022 7.0  4.0 - 10.5 K/uL Final   RBC 07/04/2022 4.18  3.87 - 5.11 MIL/uL Final   Hemoglobin 07/04/2022 12.8  12.0 - 15.0 g/dL Final   HCT 09/81/1914 39.1  36.0 - 46.0 % Final   MCV 07/04/2022 93.5  80.0 - 100.0 fL Final   MCH 07/04/2022 30.6  26.0 - 34.0 pg Final   MCHC 07/04/2022 32.7  30.0 - 36.0 g/dL Final   RDW 78/29/5621 12.9  11.5 - 15.5 % Final   Platelets 07/04/2022 243  150 - 400 K/uL Final   nRBC 07/04/2022 0.0  0.0 - 0.2 % Final   Neutrophils Relative % 07/04/2022 43  % Final   Neutro Abs 07/04/2022 3.0  1.7 - 7.7 K/uL Final   Lymphocytes Relative 07/04/2022 50  % Final   Lymphs Abs 07/04/2022 3.5  0.7 - 4.0 K/uL Final   Monocytes Relative 07/04/2022 7  %  Final   Monocytes Absolute 07/04/2022 0.5  0.1 - 1.0 K/uL Final   Eosinophils Relative 07/04/2022 0  % Final   Eosinophils Absolute 07/04/2022 0.0  0.0 - 0.5 K/uL Final   Basophils Relative 07/04/2022 0  % Final   Basophils Absolute 07/04/2022 0.0  0.0 - 0.1 K/uL Final   Immature Granulocytes 07/04/2022 0  % Final   Abs Immature Granulocytes 07/04/2022 0.01  0.00 - 0.07 K/uL Final   Performed at [facility], [address], [place], [place] [address]   I-stat hCG, quantitative 07/04/2022 <5.0  <5 mIU/mL Final   Comment 3 07/04/2022          Final   Comment:   GEST. AGE      CONC.  (mIU/mL)   <=1 WEEK        5 - 50     2 WEEKS       50 - 500     3 WEEKS       100 - 10,000     4 WEEKS     1,000 - 30,000        FEMALE AND NON-PREGNANT FEMALE:     LESS THAN 5 mIU/mL   Admission on 06/14/2022, Discharged on 06/14/2022  Component Date Value Ref Range Status   Color, UA 06/14/2022 yellow  yellow Final   Clarity, UA 06/14/2022 cloudy (A)  clear Final   Glucose, UA 06/14/2022 negative  negative mg/dL Final   Bilirubin, UA 78/46/9629 negative  negative Final   Ketones, POC UA 06/14/2022 negative  negative mg/dL Final   Spec Grav, UA 52/84/1324 1.025  1.010 - 1.025 Final   Blood, UA 06/14/2022 negative  negative Final   pH, UA 06/14/2022 7.5  5.0 - 8.0 Final   Protein Ur, POC 06/14/2022 =30 (A)  negative mg/dL Final   Urobilinogen, UA 06/14/2022 0.2  0.2 or 1.0 E.U./dL Final   Nitrite, UA 40/07/2724 Negative  Negative  Final   Leukocytes, UA 06/14/2022 Negative  Negative Final   Preg Test, Ur 06/14/2022 Negative  Negative Final   Specimen Description 06/14/2022 URINE, CLEAN CATCH   Final   Special Requests 06/14/2022    Final                   Value:NONE Performed at [facility], [address], [place], [place] [address]    Culture 06/14/2022 MULTIPLE SPECIES PRESENT, SUGGEST RECOLLECTION (A)   Final   Report Status 06/14/2022 06/16/2022 FINAL   Final   Admission on 06/07/2022, Discharged on 06/10/2022  Component Date Value Ref Range Status   SARS Coronavirus 2 by RT PCR 06/07/2022 NEGATIVE  NEGATIVE Final   Comment: (NOTE) SARS-CoV-2 target nucleic acids are NOT DETECTED.  The SARS-CoV-2 RNA is generally detectable in upper respiratory specimens during the acute phase of infection. The lowest concentration of SARS-CoV-2 viral copies this assay can detect is 138 copies/mL. A negative result does not preclude SARS-Cov-2 infection and should not be used as the sole basis for treatment or other patient management decisions. A negative result may occur with  improper specimen collection/handling, submission of specimen other than nasopharyngeal swab, presence of viral mutation(s) within the areas targeted by this assay, and inadequate number of viral copies(<138 copies/mL). A negative result must be combined with clinical observations, patient history, and epidemiological information. The expected result is Negative.  Fact Sheet for Patients:  BloggerCourse.com  Fact Sheet for Healthcare Providers:  SeriousBroker.it  This test is no                          t yet approved or cleared by the [place] FDA and  has been authorized for detection and/or diagnosis of SARS-CoV-2 by FDA under an Emergency Use Authorization (EUA). This EUA will remain  in effect (meaning this test can be used) for the duration of the COVID-19 declaration under Section 564(b)(1) of the Act, 21 U.S.C.section 360bbb-3(b)(1), unless the authorization is terminated  or revoked sooner.       Influenza A by PCR 06/07/2022 NEGATIVE  NEGATIVE Final   Influenza B by PCR 06/07/2022 NEGATIVE  NEGATIVE Final   Comment: (NOTE) The Xpert Xpress SARS-CoV-2/FLU/RSV plus assay is intended as an aid in the diagnosis of influenza from Nasopharyngeal swab specimens and should not be used as a sole basis for treatment.  Nasal washings and aspirates are unacceptable for Xpert Xpress SARS-CoV-2/FLU/RSV testing.  Fact Sheet for Patients: BloggerCourse.com  Fact Sheet for Healthcare Providers: SeriousBroker.it  This test is not yet approved or cleared by the [place] FDA and has been authorized for detection and/or diagnosis of SARS-CoV-2 by FDA under an Emergency Use Authorization (EUA). This EUA will remain in effect (meaning this test can be used) for the duration of the COVID-19 declaration under Section 564(b)(1) of the Act, 21 U.S.C. section 360bbb-3(b)(1), unless the authorization is terminated or revoked.  Performed at [facility], [address] [place], [place] [address]    WBC 06/07/2022 5.7  4.0 - 10.5 K/uL Final   RBC 06/07/2022 4.39  3.87 - 5.11 MIL/uL Final   Hemoglobin 06/07/2022 13.2  12.0 - 15.0 g/dL Final   HCT 09/81/1914 40.4  36.0 - 46.0 % Final   MCV 06/07/2022 92.0  80.0 - 100.0 fL Final   MCH 06/07/2022 30.1  26.0 - 34.0 pg Final   MCHC 06/07/2022 32.7  30.0 - 36.0  g/dL Final   RDW 11/91/4782 12.4  11.5 - 15.5 % Final   Platelets 06/07/2022 308  150 - 400 K/uL Final   nRBC 06/07/2022 0.0  0.0 - 0.2 % Final   Neutrophils Relative % 06/07/2022 42  % Final   Neutro Abs 06/07/2022 2.4  1.7 - 7.7 K/uL Final   Lymphocytes Relative 06/07/2022 54  % Final   Lymphs Abs 06/07/2022 3.1  0.7 - 4.0 K/uL Final   Monocytes Relative 06/07/2022 4  % Final   Monocytes Absolute 06/07/2022 0.2  0.1 - 1.0 K/uL Final   Eosinophils Relative 06/07/2022 0  % Final   Eosinophils Absolute 06/07/2022 0.0  0.0 - 0.5 K/uL Final   Basophils Relative 06/07/2022 0  % Final   Basophils Absolute 06/07/2022 0.0  0.0 - 0.1 K/uL Final   Immature Granulocytes 06/07/2022 0  % Final   Abs Immature Granulocytes 06/07/2022 0.01  0.00 - 0.07 K/uL Final   Performed at [facility], [address] [place], [place] [address]   Sodium 06/07/2022  139  135 - 145 mmol/L Final   Potassium 06/07/2022 4.0  3.5 - 5.1 mmol/L Final   Chloride 06/07/2022 104  98 - 111 mmol/L Final   CO2 06/07/2022 28  22 - 32 mmol/L Final   Glucose, Bld 06/07/2022 104 (H)  70 - 99 mg/dL Final   Glucose reference range applies only to samples taken after fasting for at least 8 hours.   BUN 06/07/2022 8  6 - 20 mg/dL Final   Creatinine, Ser 06/07/2022 0.84  0.44 - 1.00 mg/dL Final   Calcium 30/86/5784 9.1  8.9 - 10.3 mg/dL Final   Total Protein 69/62/9528 6.9  6.5 - 8.1 g/dL Final   Albumin 41/32/4401 3.7  3.5 - 5.0 g/dL Final   AST 02/72/5366 19  15 - 41 U/L Final   ALT 06/07/2022 22  0 - 44 U/L Final   Alkaline Phosphatase 06/07/2022 54  38 - 126 U/L Final   Total Bilirubin 06/07/2022 0.4  0.3 - 1.2 mg/dL Final   GFR, Estimated 06/07/2022 >60  >60 mL/min Final   Comment: (NOTE) Calculated using the CKD-EPI Creatinine Equation (2021)    Anion gap 06/07/2022 7  5 - 15 Final   Performed at [facility], [address] [place], [place] [address]   Hgb A1c MFr Bld 06/07/2022 5.0  4.8 - 5.6 % Final   Comment: (NOTE) Pre diabetes:          5.7%-6.4%  Diabetes:              >6.4%  Glycemic control for   <7.0% adults with diabetes    Mean Plasma Glucose 06/07/2022 96.8  mg/dL Final   Performed at [facility], [address], [place], [place] [address]   TSH 06/07/2022 1.620  0.350 - 4.500 uIU/mL Final   Comment: Performed by a 3rd Generation assay with a functional sensitivity of <=0.01 uIU/mL. Performed at [facility], [address] [place], [place] [address]    RPR Ser Ql 06/07/2022 NON REACTIVE  NON REACTIVE Final   Performed at [facility], [address] [place], [place] [address]   Color, Urine 06/07/2022 YELLOW  YELLOW Final   APPearance 06/07/2022 HAZY (A)  CLEAR Final   Specific Gravity, Urine 06/07/2022 1.018  1.005 - 1.030 Final   pH 06/07/2022 7.0  5.0 - 8.0 Final   Glucose, UA 06/07/2022 NEGATIVE  NEGATIVE  mg/dL Final   Hgb urine dipstick 06/07/2022 NEGATIVE  NEGATIVE Final   Bilirubin Urine 06/07/2022 NEGATIVE  NEGATIVE Final   Ketones, ur 06/07/2022 NEGATIVE  NEGATIVE mg/dL Final   Protein, ur 16/07/9603 NEGATIVE  NEGATIVE mg/dL Final   Nitrite 54/06/8118 NEGATIVE  NEGATIVE Final   Leukocytes,Ua 06/07/2022 NEGATIVE  NEGATIVE Final   Performed at [facility], [address], [place], [place] [address]   Cholesterol 06/07/2022 164  0 - 200 mg/dL Final   Triglycerides 95/62/1308 158 (H)  <150 mg/dL Final   HDL 65/78/4696 44  >40 mg/dL Final   Total CHOL/HDL Ratio 06/07/2022 3.7  RATIO Final   VLDL 06/07/2022 32  0 - 40 mg/dL Final   LDL Cholesterol 06/07/2022 88  0 - 99 mg/dL Final   Comment:        Total Cholesterol/HDL:CHD Risk Coronary Heart Disease Risk Table                     Men   Women  1/2 Average Risk   3.4   3.3  Average Risk       5.0   4.4  2 X Average Risk   9.6   7.1  3 X Average Risk  23.4   11.0        Use the calculated Patient Ratio above and the CHD Risk Table to determine the patient's CHD Risk.        ATP III CLASSIFICATION (LDL):  <100     mg/dL   Optimal  295-284  mg/dL   Near or Above                    Optimal  130-159  mg/dL   Borderline  132-440  mg/dL   High  >102     mg/dL   Very High Performed at [facility], [address], [place], [place] [address]    HIV Screen 4th Generation wRfx 06/07/2022 Non Reactive  Non Reactive Final   Performed at [facility], [address] [place], [place] [address]   SARSCOV2ONAVIRUS 2 AG 06/07/2022 NEGATIVE  NEGATIVE Final   Comment: (NOTE) SARS-CoV-2 antigen NOT DETECTED.   Negative results are presumptive.  Negative results do not preclude SARS-CoV-2 infection and should not be used as the sole basis for treatment or other patient management decisions, including infection  control decisions, particularly in the presence of clinical signs and  symptoms consistent with COVID-19, or in  those who have been in contact with the virus.  Negative results must be combined with clinical observations, patient history, and epidemiological information. The expected result is Negative.  Fact Sheet for Patients: https://www.jennings-kim.com/  Fact Sheet for Healthcare Providers: https://alexander-rogers.biz/  This test is not yet approved or cleared by the [place] FDA and  has been authorized for detection and/or diagnosis of SARS-CoV-2 by FDA under an Emergency Use Authorization (EUA).  This EUA will remain in effect (meaning this test can be used) for the duration of  the COV                          ID-19 declaration under Section 564(b)(1) of the Act, 21 U.S.C. section 360bbb-3(b)(1), unless the authorization is terminated or revoked sooner.     POC Amphetamine UR 06/07/2022 None Detected  NONE DETECTED (Cut Off Level 1000 ng/mL) Final   POC Secobarbital (BAR) 06/07/2022 None Detected  NONE DETECTED (Cut Off Level 300 ng/mL) Final   POC Buprenorphine (BUP) 06/07/2022 None Detected  NONE DETECTED (Cut Off Level 10 ng/mL) Final   POC Oxazepam (BZO) 06/07/2022 None Detected  NONE DETECTED (Cut Off Level 300 ng/mL) Final   POC Cocaine UR 06/07/2022 None Detected  NONE DETECTED (Cut Off Level 300 ng/mL) Final   POC Methamphetamine UR 06/07/2022 None Detected  NONE DETECTED (Cut Off Level 1000 ng/mL) Final   POC Morphine 06/07/2022 None Detected  NONE DETECTED (Cut Off Level 300 ng/mL) Final   POC Methadone UR 06/07/2022 None Detected  NONE DETECTED (Cut Off Level 300 ng/mL) Final   POC Oxycodone UR 06/07/2022 None Detected  NONE DETECTED (Cut Off Level 100 ng/mL) Final   POC Marijuana UR 06/07/2022 None Detected  NONE DETECTED (Cut Off Level 50 ng/mL) Final   Preg Test, Ur 06/08/2022 NEGATIVE  NEGATIVE Final   Comment:        THE SENSITIVITY OF THIS METHODOLOGY IS >20 mIU/mL. Performed at [facility], [address] [place], [place]  [address]    Preg Test, Ur 06/08/2022 NEGATIVE  NEGATIVE Final   Comment:        THE SENSITIVITY OF THIS METHODOLOGY IS >24 mIU/mL     Blood Alcohol level:  Lab Results  Component Value Date   ETH <10 07/04/2022   ETH <10 02/07/2022    Metabolic Disorder Labs: Lab Results  Component Value Date   HGBA1C 5.0 06/07/2022   MPG 96.8 06/07/2022   MPG 96.8 02/07/2022   No results found for: "PROLACTIN" Lab Results  Component Value Date   CHOL 181 07/25/2022   TRIG 118 07/25/2022   HDL 45 07/25/2022   CHOLHDL 4.0 07/25/2022   VLDL 24 07/25/2022   LDLCALC 112 (H) 07/25/2022   LDLCALC 88 06/07/2022    Therapeutic Lab Levels: No results found for: "LITHIUM" No results found for: "VALPROATE" No results found for: "CBMZ"  Physical Findings   GAD-7    Flowsheet Row Office Visit from 12/17/2021 in [facility]  Total GAD-7 Score 17      PHQ2-9    Flowsheet Row ED from 06/07/2022 in [facility] Office Visit from 12/17/2021 in [facility]  PHQ-2 Total Score 1 2  PHQ-9 Total Score 2 8      Flowsheet Row ED from 10/07/2022 in [facility] Most recent reading at 10/07/2022  8:16 PM ED from 10/07/2022 in [facility] EMERGENCY DEPARTMENT Most recent reading at 10/07/2022 12:18 PM ED from 07/25/2022 in [facility] Most recent reading at 10/06/2022  9:38 AM  C-SSRS RISK CATEGORY No Risk No Risk No Risk        Musculoskeletal  Strength & Muscle Tone: within normal limits Gait & Station: normal Patient leans: N/A  Psychiatric Specialty Exam  Presentation  General Appearance:  Casual; Appropriate for Environment  Eye Contact: Good  Speech: Clear and Coherent; Normal Rate  Speech Volume: Normal  Handedness: Right   Mood and Affect  Mood: Euthymic  Affect: Flat   Thought Process  Thought  Processes: Coherent; Linear  Descriptions of Associations:Intact  Orientation:Full (Time, Place and Person)  Thought Content:Logical  Diagnosis of Schizophrenia or Schizoaffective disorder in past: Yes    Hallucinations:Hallucinations: None   Ideas of Reference:None  Suicidal Thoughts:Suicidal Thoughts: No   Homicidal Thoughts:Homicidal Thoughts: No    Sensorium  Memory: Immediate  Fair; Recent Fair; Remote Fair  Judgment: Fair  Insight: Fair   Chartered certified accountantxecutive Functions  Concentration: Fair  Attention Span: Fair  Recall: FiservFair  Fund of Knowledge: Fair  Language: Fair   Psychomotor Activity  Psychomotor Activity:Psychomotor Activity: Normal    Assets  Assets: Manufacturing systems engineerCommunication Skills; Desire for Improvement; Social Support   Sleep  Sleep: Sleep: Good   Physical Exam  Physical Exam Constitutional:      General: She is not in acute distress.    Appearance: Normal appearance. She is not ill-appearing.  HENT:     Head: Normocephalic and atraumatic.  Musculoskeletal:        General: Normal range of motion.  Skin:    General: Skin is warm and dry.  Neurological:     Mental Status: She is alert and oriented to person, place, and time.  Psychiatric:        Attention and Perception: Attention and perception normal.        Mood and Affect: Mood and affect normal.        Speech: Speech normal.        Behavior: Behavior normal. Behavior is cooperative.        Thought Content: Thought content normal.        Judgment: Judgment normal.    Review of Systems  Constitutional:  Negative for chills and fever.  Respiratory:  Negative for shortness of breath.   Cardiovascular:  Negative for chest pain and palpitations.  Gastrointestinal:  Negative for abdominal pain.  Genitourinary:  Negative for dysuria, flank pain and urgency.  Psychiatric/Behavioral:  Negative for depression, hallucinations, memory loss, substance abuse and suicidal ideas. The patient is  not nervous/anxious and does not have insomnia.    Blood pressure 91/78, pulse 95, temperature 98.3 F (36.8 C), temperature source Oral, resp. rate 16, height 5\' 1"  (1.549 m), weight 198 lb (89.8 kg), SpO2 99 %. Body mass index is 37.41 kg/m.  Treatment Plan Summary: Plan Pt remains psychiatrically cleared. Awaiting placement in residential facility.  Lorri FrederickMargely Carrion-Carrero, MD 10/10/2022 10:32 AM

## 2022-10-10 NOTE — ED Notes (Signed)
Pt resting on bed calm and cooperative. Will continue to monitor for safety

## 2022-10-10 NOTE — ED Notes (Signed)
Patient is sleeping. Patient is being monitored for safety.

## 2022-10-10 NOTE — ED Notes (Signed)
Patient has denied SI/HI and AVH. Patient was able to speak to the social worker regarding discharge. Patient complained of an upset stomach received Mylanta. Patient reported she has not had anymore complications of having an upset stomach. Patient is being monitored for safety.

## 2022-10-10 NOTE — ED Notes (Signed)
Pt sleeping@this time. Breathing even and unlabored. Will continue to monitor for safety 

## 2022-10-10 NOTE — ED Notes (Signed)
Patient complains of nausea no vomiting. Patient will be medicated per MAR. Respirations equal and unlabored, skin warm and dry. No acute distress noted. Will continue to monitor.

## 2022-10-11 NOTE — Progress Notes (Signed)
Received Nicole [name] this AM awake sitting up on her chair bed. She ate breakfast and received her medication. Later she talked with the social worker and attended her meeting. She feels the meeting went well and hoping for placement soon.

## 2022-10-11 NOTE — ED Notes (Signed)
Pt resting in bed. Respirations even and unlabored. Monitoring for safety. 

## 2022-10-11 NOTE — ED Notes (Signed)
Pt at the nurses desk demanding  night shift staff come and see her. She has been redirected.

## 2022-10-11 NOTE — ED Notes (Signed)
Pt had bedtime snack. 

## 2022-10-11 NOTE — ED Provider Notes (Signed)
Behavioral Health Progress Note  Date and Time: 10/11/2022 9:34 AM Name: Nicole [name] MRN:  [id number]  Subjective:  "I am okay."  Patient is reassessed, face-to-face, by nurse practitioner.  She is seated in observation area, speaking with transition of care team member, upon my approach.  She is alert and oriented, pleasant and cooperative during assessment.  She presents with euthymic mood, congruent affect.  Nicole [name] is looking forward to discharge when accepting facility becomes available.  Nicole [name] continues to deny suicidal and homicidal ideation.  She easily contracts verbally for safety with this Probation officer.  She also denies auditory and visual hallucinations.  There is no evidence of delusional thought content and no indication that patient is responding to internal stimuli.  Patient interacts appropriately with staff and peers.  She is compliant with medications.  She endorses average sleep and appetite.  Patient offered support and encouragement.  Patient remains cleared by psychiatry.  She verbalized understanding of treatment plan to include awaiting disposition when group home placement confirmed.  Nicole [name] is a 31 y.o. female, with PMH of SCZ unspecified, MDD with psychosis, who presented voluntary to [facility] (07/25/2022) via [employer] after a verbal altercation with [name] (not patient's legal guardian) for transient SI.  This is patient's fourth visit to [facility] for similar concerns this year. Patient has been dismissed from previous group home due to threatening SI and HI, then leaving the group home.   Legal Guardian: [name]) transitioning to be [name]) Point of contact: [name])     Diagnosis:  Final diagnoses:  Tobacco use disorder  Schizophrenia, unspecified type ([place])  MDD (major depressive disorder), recurrent episode, moderate ([place])  Hypothyroidism, unspecified type    Total Time spent with patient: 30 minutes  Past  Psychiatric History: see H & P Past Medical History:  Past Medical History:  Diagnosis Date   Anxiety    Depression    Hypothyroidism 08/07/2022   Tobacco use disorder 08/07/2022    Past Surgical History:  Procedure Laterality Date   WISDOM TOOTH EXTRACTION Bilateral 2020   Family History:  Family History  Problem Relation Age of Onset   Hypertension Father    Diabetes Father    Family Psychiatric  History: see H & P Social History:  Social History   Substance and Sexual Activity  Alcohol Use Never     Social History   Substance and Sexual Activity  Drug Use Never    Social History   Socioeconomic History   Marital status: Single    Spouse name: Not on file   Number of children: Not on file   Years of education: Not on file   Highest education level: Not on file  Occupational History   Not on file  Tobacco Use   Smoking status: Every Day    Types: Cigarettes   Smokeless tobacco: Never  Vaping Use   Vaping Use: Some days  Substance and Sexual Activity   Alcohol use: Never   Drug use: Never   Sexual activity: Yes    Partners: Female    Birth control/protection: Implant  Other Topics Concern   Not on file  Social History Narrative   Not on file   Social Determinants of Health   Financial Resource Strain: Not on file  Food Insecurity: Not on file  Transportation Needs: Not on file  Physical Activity: Not on file  Stress: Not on file  Social Connections: Not on file   SDOH:  SDOH  Screenings   Depression (PHQ2-9): Low Risk  (06/10/2022)  Tobacco Use: High Risk (10/07/2022)   Additional Social History:                         Sleep: Good  Appetite:  Good  Current Medications:  Current Facility-Administered Medications  Medication Dose Route Frequency Provider Last Rate Last Admin   acetaminophen (TYLENOL) tablet 650 mg  650 mg Oral Q6H PRN Maple Hudson, Veronique M, NP       alum & mag hydroxide-simeth (MAALOX/MYLANTA) 200-200-20  MG/5ML suspension 30 mL  30 mL Oral Q4H PRN Maple Hudson, Veronique M, NP   30 mL at 10/10/22 1659   docusate sodium (COLACE) capsule 100 mg  100 mg Oral Daily Carrion-Carrero, Caryl Ada, MD   100 mg at 10/11/22 0915   hydrOXYzine (ATARAX) tablet 25 mg  25 mg Oral TID PRN Christene Slates, MD   25 mg at 10/10/22 2136   levothyroxine (SYNTHROID) tablet 100 mcg  100 mcg Oral Once Byungura, Veronique M, NP       levothyroxine (SYNTHROID) tablet 100 mcg  100 mcg Oral Q0600 Haynes Kerns, NP   100 mcg at 10/11/22 0543   loratadine (CLARITIN) tablet 10 mg  10 mg Oral Daily Carrion-Carrero, Margely, MD   10 mg at 10/11/22 0915   magnesium hydroxide (MILK OF MAGNESIA) suspension 30 mL  30 mL Oral Daily PRN Haynes Kerns, NP       metFORMIN (GLUCOPHAGE) tablet 500 mg  500 mg Oral Q breakfast Carrion-Carrero, Margely, MD   500 mg at 10/11/22 0851   nicotine (NICODERM CQ - dosed in mg/24 hours) patch 14 mg  14 mg Transdermal Daily PRN Carrion-Carrero, Margely, MD       nitrofurantoin (macrocrystal-monohydrate) (MACROBID) capsule 100 mg  100 mg Oral Q12H Maple Hudson, Veronique M, NP   100 mg at 10/11/22 0915   ondansetron (ZOFRAN-ODT) disintegrating tablet 4 mg  4 mg Oral Q8H PRN Carrion-Carrero, Caryl Ada, MD   4 mg at 10/10/22 1312   Oxcarbazepine (TRILEPTAL) tablet 300 mg  300 mg Oral BID Maple Hudson, Veronique M, NP   300 mg at 10/11/22 0916   pantoprazole (PROTONIX) EC tablet 40 mg  40 mg Oral Daily Carrion-Carrero, Margely, MD   40 mg at 10/11/22 0916   QUEtiapine (SEROQUEL) tablet 400 mg  400 mg Oral BID Maple Hudson, Veronique M, NP   400 mg at 10/11/22 0915   sertraline (ZOLOFT) tablet 150 mg  150 mg Oral Daily Carrion-Carrero, Margely, MD   150 mg at 10/11/22 0915   traZODone (DESYREL) tablet 100 mg  100 mg Oral QHS Ronelle Nigh M, NP   100 mg at 10/10/22 2137   traZODone (DESYREL) tablet 50 mg  50 mg Oral QHS PRN Haynes Kerns, NP   50 mg at 10/10/22 2140   valACYclovir (VALTREX)  tablet 500 mg  500 mg Oral Daily Carrion-Carrero, Caryl Ada, MD   500 mg at 10/11/22 0915   Current Outpatient Medications  Medication Sig Dispense Refill   ABILIFY MAINTENA 400 MG PRSY prefilled syringe 400 mg every 28 (twenty-eight) days.     cetirizine (ZYRTEC) 10 MG tablet Take 10 mg by mouth daily.     cyclobenzaprine (FLEXERIL) 10 MG tablet Take 1 tablet (10 mg total) by mouth 2 (two) times daily as needed for muscle spasms. 20 tablet 0   fluticasone (FLONASE) 50 MCG/ACT nasal spray Place 1 spray into both nostrils daily.     meloxicam (  MOBIC) 15 MG tablet Take 15 mg by mouth daily.     nitrofurantoin, macrocrystal-monohydrate, (MACROBID) 100 MG capsule Take 1 capsule (100 mg total) by mouth 2 (two) times daily. 10 capsule 0   ondansetron (ZOFRAN-ODT) 4 MG disintegrating tablet Take 1 tablet (4 mg total) by mouth every 8 (eight) hours as needed for nausea or vomiting. 20 tablet 0   Oxcarbazepine (TRILEPTAL) 300 MG tablet Take 1 tablet (300 mg total) by mouth 2 (two) times daily. 60 tablet 0   QUEtiapine (SEROQUEL) 400 MG tablet Take 1 tablet (400 mg total) by mouth 2 (two) times daily. 60 tablet 0   sertraline (ZOLOFT) 50 MG tablet Take 3 tablets (150 mg total) by mouth in the morning. 90 tablet 0   traZODone (DESYREL) 100 MG tablet Take 1 tablet (100 mg total) by mouth at bedtime. 30 tablet 0   valACYclovir (VALTREX) 500 MG tablet Take 500 mg by mouth daily.      Labs  Lab Results:  Admission on 10/07/2022, Discharged on 10/07/2022  Component Date Value Ref Range Status   Color, Urine 10/07/2022 YELLOW  YELLOW Final   APPearance 10/07/2022 HAZY (A)  CLEAR Final   Specific Gravity, Urine 10/07/2022 1.011  1.005 - 1.030 Final   pH 10/07/2022 7.0  5.0 - 8.0 Final   Glucose, UA 10/07/2022 NEGATIVE  NEGATIVE mg/dL Final   Hgb urine dipstick 10/07/2022 SMALL (A)  NEGATIVE Final   Bilirubin Urine 10/07/2022 NEGATIVE  NEGATIVE Final   Ketones, ur 10/07/2022 NEGATIVE  NEGATIVE mg/dL Final    Protein, ur 10/07/2022 NEGATIVE  NEGATIVE mg/dL Final   Nitrite 10/07/2022 NEGATIVE  NEGATIVE Final   Leukocytes,Ua 10/07/2022 LARGE (A)  NEGATIVE Final   RBC / HPF 10/07/2022 0-5  0 - 5 RBC/hpf Final   WBC, UA 10/07/2022 0-5  0 - 5 WBC/hpf Final   Bacteria, UA 10/07/2022 FEW (A)  NONE SEEN Final   Squamous Epithelial / LPF 10/07/2022 11-20  0 - 5 Final   Performed at [facility], [place] [address] [place], [place] [address]   I-stat hCG, quantitative 10/07/2022 <5.0  <5 mIU/mL Final   Comment 3 10/07/2022          Final   Comment:   GEST. AGE      CONC.  (mIU/mL)   <=1 WEEK        5 - 50     2 WEEKS       50 - 500     3 WEEKS       100 - 10,000     4 WEEKS     1,000 - 30,000        FEMALE AND NON-PREGNANT FEMALE:     LESS THAN 5 mIU/mL    Sodium 10/07/2022 138  135 - 145 mmol/L Final   Potassium 10/07/2022 4.3  3.5 - 5.1 mmol/L Final   Chloride 10/07/2022 104  98 - 111 mmol/L Final   CO2 10/07/2022 28  22 - 32 mmol/L Final   Glucose, Bld 10/07/2022 90  70 - 99 mg/dL Final   Glucose reference range applies only to samples taken after fasting for at least 8 hours.   BUN 10/07/2022 8  6 - 20 mg/dL Final   Creatinine, Ser 10/07/2022 0.96  0.44 - 1.00 mg/dL Final   Calcium 10/07/2022 9.1  8.9 - 10.3 mg/dL Final   GFR, Estimated 10/07/2022 >60  >60 mL/min Final   Comment: (NOTE) Calculated using the CKD-EPI  Creatinine Equation (2021)    Anion gap 10/07/2022 6  5 - 15 Final   Performed at [facility], [place] [address] [place], [place] [address]   WBC 10/07/2022 5.8  4.0 - 10.5 K/uL Final   RBC 10/07/2022 4.36  3.87 - 5.11 MIL/uL Final   Hemoglobin 10/07/2022 12.9  12.0 - 15.0 g/dL Final   HCT 10/07/2022 40.0  36.0 - 46.0 % Final   MCV 10/07/2022 91.7  80.0 - 100.0 fL Final   MCH 10/07/2022 29.6  26.0 - 34.0 pg Final   MCHC 10/07/2022 32.3  30.0 - 36.0 g/dL Final   RDW 10/07/2022 12.4  11.5 - 15.5 % Final   Platelets 10/07/2022 295  150 - 400 K/uL Final   nRBC 10/07/2022  0.0  0.0 - 0.2 % Final   Performed at [facility] [address], [place], [place] [address]   SARS Coronavirus 2 by RT PCR 10/07/2022 NEGATIVE  NEGATIVE Final   Comment: (NOTE) SARS-CoV-2 target nucleic acids are NOT DETECTED.  The SARS-CoV-2 RNA is generally detectable in upper respiratory specimens during the acute phase of infection. The lowest concentration of SARS-CoV-2 viral copies this assay can detect is 138 copies/mL. A negative result does not preclude SARS-Cov-2 infection and should not be used as the sole basis for treatment or other patient management decisions. A negative result may occur with  improper specimen collection/handling, submission of specimen other than nasopharyngeal swab, presence of viral mutation(s) within the areas targeted by this assay, and inadequate number of viral copies(<138 copies/mL). A negative result must be combined with clinical observations, patient history, and epidemiological information. The expected result is Negative.  Fact Sheet for Patients:  EntrepreneurPulse.com.au  Fact Sheet for Healthcare Providers:  IncredibleEmployment.be  This test is no                          t yet approved or cleared by the [place] FDA and  has been authorized for detection and/or diagnosis of SARS-CoV-2 by FDA under an Emergency Use Authorization (EUA). This EUA will remain  in effect (meaning this test can be used) for the duration of the COVID-19 declaration under Section 564(b)(1) of the Act, 21 U.S.C.section 360bbb-3(b)(1), unless the authorization is terminated  or revoked sooner.       Influenza A by PCR 10/07/2022 NEGATIVE  NEGATIVE Final   Influenza B by PCR 10/07/2022 NEGATIVE  NEGATIVE Final   Comment: (NOTE) The Xpert Xpress SARS-CoV-2/FLU/RSV plus assay is intended as an aid in the diagnosis of influenza from Nasopharyngeal swab specimens and should not be used as a sole basis for  treatment. Nasal washings and aspirates are unacceptable for Xpert Xpress SARS-CoV-2/FLU/RSV testing.  Fact Sheet for Patients: EntrepreneurPulse.com.au  Fact Sheet for Healthcare Providers: IncredibleEmployment.be  This test is not yet approved or cleared by the [place] FDA and has been authorized for detection and/or diagnosis of SARS-CoV-2 by FDA under an Emergency Use Authorization (EUA). This EUA will remain in effect (meaning this test can be used) for the duration of the COVID-19 declaration under Section 564(b)(1) of the Act, 21 U.S.C. section 360bbb-3(b)(1), unless the authorization is terminated or revoked.     Resp Syncytial Virus by PCR 10/07/2022 NEGATIVE  NEGATIVE Final   Comment: (NOTE) Fact Sheet for Patients: EntrepreneurPulse.com.au  Fact Sheet for Healthcare Providers: IncredibleEmployment.be  This test is not yet approved or cleared by the [place] and  has been authorized for detection and/or diagnosis of SARS-CoV-2 by FDA under an Emergency Use Authorization (EUA). This EUA will remain in effect (meaning this test can be used) for the duration of the COVID-19 declaration under Section 564(b)(1) of the Act, 21 U.S.C. section 360bbb-3(b)(1), unless the authorization is terminated or revoked.  Performed at [facility], [place] [address], [place], [place] [address]    Specimen Description 10/07/2022 URINE, CLEAN CATCH   Final   Special Requests 10/07/2022    Final                   Value:NONE Performed at [facility], [place] [address], [place], [place] [address]    Culture 10/07/2022 MULTIPLE SPECIES PRESENT, SUGGEST RECOLLECTION (A)   Final   Report Status 10/07/2022 10/08/2022 FINAL   Final  Admission on 07/25/2022, Discharged on 10/07/2022  Component Date Value Ref Range Status   SARS Coronavirus 2 by RT PCR 07/25/2022 NEGATIVE  NEGATIVE Final   Comment:  (NOTE) SARS-CoV-2 target nucleic acids are NOT DETECTED.  The SARS-CoV-2 RNA is generally detectable in upper respiratory specimens during the acute phase of infection. The lowest concentration of SARS-CoV-2 viral copies this assay can detect is 138 copies/mL. A negative result does not preclude SARS-Cov-2 infection and should not be used as the sole basis for treatment or other patient management decisions. A negative result may occur with  improper specimen collection/handling, submission of specimen other than nasopharyngeal swab, presence of viral mutation(s) within the areas targeted by this assay, and inadequate number of viral copies(<138 copies/mL). A negative result must be combined with clinical observations, patient history, and epidemiological information. The expected result is Negative.  Fact Sheet for Patients:  EntrepreneurPulse.com.au  Fact Sheet for Healthcare Providers:  IncredibleEmployment.be  This test is no                          t yet approved or cleared by the [place] FDA and  has been authorized for detection and/or diagnosis of SARS-CoV-2 by FDA under an Emergency Use Authorization (EUA). This EUA will remain  in effect (meaning this test can be used) for the duration of the COVID-19 declaration under Section 564(b)(1) of the Act, 21 U.S.C.section 360bbb-3(b)(1), unless the authorization is terminated  or revoked sooner.       Influenza A by PCR 07/25/2022 NEGATIVE  NEGATIVE Final   Influenza B by PCR 07/25/2022 NEGATIVE  NEGATIVE Final   Comment: (NOTE) The Xpert Xpress SARS-CoV-2/FLU/RSV plus assay is intended as an aid in the diagnosis of influenza from Nasopharyngeal swab specimens and should not be used as a sole basis for treatment. Nasal washings and aspirates are unacceptable for Xpert Xpress SARS-CoV-2/FLU/RSV testing.  Fact Sheet for Patients: EntrepreneurPulse.com.au  Fact  Sheet for Healthcare Providers: IncredibleEmployment.be  This test is not yet approved or cleared by the [place] FDA and has been authorized for detection and/or diagnosis of SARS-CoV-2 by FDA under an Emergency Use Authorization (EUA). This EUA will remain in effect (meaning this test can be used) for the duration of the COVID-19 declaration under Section 564(b)(1) of the Act, 21 U.S.C. section 360bbb-3(b)(1), unless the authorization is terminated or revoked.  Performed at [facility], [place] [address] [place], [place] [address]    WBC 07/25/2022 8.3  4.0 - 10.5 K/uL Final   RBC 07/25/2022 4.44  3.87 - 5.11 MIL/uL Final   Hemoglobin 07/25/2022 13.7  12.0 - 15.0 g/dL Final   HCT 57/32/2025 40.2  36.0 - 46.0 % Final   MCV 07/25/2022 90.5  80.0 - 100.0 fL Final   MCH 07/25/2022 30.9  26.0 - 34.0 pg Final   MCHC 07/25/2022 34.1  30.0 - 36.0 g/dL Final   RDW 42/70/6237 12.2  11.5 - 15.5 % Final   Platelets 07/25/2022 248  150 - 400 K/uL Final   nRBC 07/25/2022 0.0  0.0 - 0.2 % Final   Neutrophils Relative % 07/25/2022 43  % Final   Neutro Abs 07/25/2022 3.6  1.7 - 7.7 K/uL Final   Lymphocytes Relative 07/25/2022 52  % Final   Lymphs Abs 07/25/2022 4.2 (H)  0.7 - 4.0 K/uL Final   Monocytes Relative 07/25/2022 5  % Final   Monocytes Absolute 07/25/2022 0.4  0.1 - 1.0 K/uL Final   Eosinophils Relative 07/25/2022 0  % Final   Eosinophils Absolute 07/25/2022 0.0  0.0 - 0.5 K/uL Final   Basophils Relative 07/25/2022 0  % Final   Basophils Absolute 07/25/2022 0.0  0.0 - 0.1 K/uL Final   Immature Granulocytes 07/25/2022 0  % Final   Abs Immature Granulocytes 07/25/2022 0.02  0.00 - 0.07 K/uL Final   Performed at [facility] [place] [facility], [address] [place], [place] [address]   Sodium 07/25/2022 138  135 - 145 mmol/L Final   Potassium 07/25/2022 4.0  3.5 - 5.1 mmol/L Final   Chloride 07/25/2022 104  98 - 111 mmol/L Final   CO2 07/25/2022 29  22 - 32 mmol/L  Final   Glucose, Bld 07/25/2022 83  70 - 99 mg/dL Final   Glucose reference range applies only to samples taken after fasting for at least 8 hours.   BUN 07/25/2022 11  6 - 20 mg/dL Final   Creatinine, Ser 07/25/2022 0.97  0.44 - 1.00 mg/dL Final   Calcium 51/76/1607 9.2  8.9 - 10.3 mg/dL Final   Total Protein 37/07/6268 7.0  6.5 - 8.1 g/dL Final   Albumin 48/54/6270 3.8  3.5 - 5.0 g/dL Final   AST 35/00/9381 18  15 - 41 U/L Final   ALT 07/25/2022 22  0 - 44 U/L Final   Alkaline Phosphatase 07/25/2022 64  38 - 126 U/L Final   Total Bilirubin 07/25/2022 0.2 (L)  0.3 - 1.2 mg/dL Final   GFR, Estimated 07/25/2022 >60  >60 mL/min Final   Comment: (NOTE) Calculated using the CKD-EPI Creatinine Equation (2021)    Anion gap 07/25/2022 5  5 - 15 Final   Performed at [facility], [address] [place], [place] [address]   POC Amphetamine UR 07/25/2022 None Detected  NONE DETECTED (Cut Off Level 1000 ng/mL) Preliminary   POC Secobarbital (BAR) 07/25/2022 None Detected  NONE DETECTED (Cut Off Level 300 ng/mL) Preliminary   POC Buprenorphine (BUP) 07/25/2022 None Detected  NONE DETECTED (Cut Off Level 10 ng/mL) Preliminary   POC Oxazepam (BZO) 07/25/2022 None Detected  NONE DETECTED (Cut Off Level 300 ng/mL) Preliminary   POC Cocaine UR 07/25/2022 None Detected  NONE DETECTED (Cut Off Level 300 ng/mL) Preliminary   POC Methamphetamine UR 07/25/2022 None Detected  NONE DETECTED (Cut Off Level 1000 ng/mL) Preliminary   POC Morphine 07/25/2022 None Detected  NONE DETECTED (Cut Off Level 300 ng/mL) Preliminary   POC Methadone UR 07/25/2022 None Detected  NONE DETECTED (Cut Off Level 300 ng/mL) Preliminary   POC Oxycodone UR 07/25/2022 None Detected  NONE DETECTED (Cut Off  Level 100 ng/mL) Preliminary   POC Marijuana UR 07/25/2022 None Detected  NONE DETECTED (Cut Off Level 50 ng/mL) Preliminary   SARSCOV2ONAVIRUS 2 AG 07/25/2022 NEGATIVE  NEGATIVE Final   Comment: (NOTE) SARS-CoV-2 antigen NOT  DETECTED.   Negative results are presumptive.  Negative results do not preclude SARS-CoV-2 infection and should not be used as the sole basis for treatment or other patient management decisions, including infection  control decisions, particularly in the presence of clinical signs and  symptoms consistent with COVID-19, or in those who have been in contact with the virus.  Negative results must be combined with clinical observations, patient history, and epidemiological information. The expected result is Negative.  Fact Sheet for Patients: HandmadeRecipes.com.cy  Fact Sheet for Healthcare Providers: FuneralLife.at  This test is not yet approved or cleared by the [place] FDA and  has been authorized for detection and/or diagnosis of SARS-CoV-2 by FDA under an Emergency Use Authorization (EUA).  This EUA will remain in effect (meaning this test can be used) for the duration of  the COV                          ID-19 declaration under Section 564(b)(1) of the Act, 21 U.S.C. section 360bbb-3(b)(1), unless the authorization is terminated or revoked sooner.     Cholesterol 07/25/2022 181  0 - 200 mg/dL Final   Triglycerides 07/25/2022 118  <150 mg/dL Final   HDL 07/25/2022 45  >40 mg/dL Final   Total CHOL/HDL Ratio 07/25/2022 4.0  RATIO Final   VLDL 07/25/2022 24  0 - 40 mg/dL Final   LDL Cholesterol 07/25/2022 112 (H)  0 - 99 mg/dL Final   Comment:        Total Cholesterol/HDL:CHD Risk Coronary Heart Disease Risk Table                     Men   Women  1/2 Average Risk   3.4   3.3  Average Risk       5.0   4.4  2 X Average Risk   9.6   7.1  3 X Average Risk  23.4   11.0        Use the calculated Patient Ratio above and the CHD Risk Table to determine the patient's CHD Risk.        ATP III CLASSIFICATION (LDL):  <100     mg/dL   Optimal  100-129  mg/dL   Near or Above                    Optimal  130-159  mg/dL    Borderline  160-189  mg/dL   High  >190     mg/dL   Very High Performed at [place] [place] [address], [place], [place] [address]    TSH 07/25/2022 6.668 (H)  0.350 - 4.500 uIU/mL Final   Comment: Performed by a 3rd Generation assay with a functional sensitivity of <=0.01 uIU/mL. Performed at [facility], [place] [address], [place], [place] [address]    Glucose-Capillary 07/26/2022 104 (H)  70 - 99 mg/dL Final   Glucose reference range applies only to samples taken after fasting for at least 8 hours.   T3, Free 07/31/2022 2.3  2.0 - 4.4 pg/mL Final   Comment: (NOTE) Performed At: [facility] [place], [place] [id number] Rush Farmer MD [id number]    Free  T4 07/31/2022 0.60 (L)  0.61 - 1.12 ng/dL Final   Comment: (NOTE) Biotin ingestion may interfere with free T4 tests. If the results are inconsistent with the TSH level, previous test results, or the clinical presentation, then consider biotin interference. If needed, order repeat testing after stopping biotin. Performed at [facility], [place] [address], [place], [place] [address]    Glucose-Capillary 08/29/2022 100 (H)  70 - 99 mg/dL Final   Glucose reference range applies only to samples taken after fasting for at least 8 hours.   TSH 09/05/2022 0.793  0.350 - 4.500 uIU/mL Final   Comment: Performed by a 3rd Generation assay with a functional sensitivity of <=0.01 uIU/mL. Performed at [facility], [place] [address], [place], [place] [address]    Free T4 09/05/2022 0.73  0.61 - 1.12 ng/dL Final   Comment: (NOTE) Biotin ingestion may interfere with free T4 tests. If the results are inconsistent with the TSH level, previous test results, or the clinical presentation, then consider biotin interference. If needed, order repeat testing after stopping biotin. Performed at [facility], [place] [address] [place], [place] [address]    Preg Test, Ur 09/06/2022 Negative  Negative  Final   Preg Test, Ur 09/06/2022 NEGATIVE  NEGATIVE Final   Comment:        THE SENSITIVITY OF THIS METHODOLOGY IS >24 mIU/mL    Preg Test, Ur 09/05/2022 NEGATIVE  NEGATIVE Final   Comment:        THE SENSITIVITY OF THIS METHODOLOGY IS >24 mIU/mL    Sodium 09/13/2022 136  135 - 145 mmol/L Final   Potassium 09/13/2022 4.5  3.5 - 5.1 mmol/L Final   Chloride 09/13/2022 105  98 - 111 mmol/L Final   CO2 09/13/2022 21 (L)  22 - 32 mmol/L Final   Glucose, Bld 09/13/2022 111 (H)  70 - 99 mg/dL Final   Glucose reference range applies only to samples taken after fasting for at least 8 hours.   BUN 09/13/2022 14  6 - 20 mg/dL Final   Creatinine, Ser 09/13/2022 0.92  0.44 - 1.00 mg/dL Final   Calcium 09/13/2022 9.1  8.9 - 10.3 mg/dL Final   GFR, Estimated 09/13/2022 >60  >60 mL/min Final   Comment: (NOTE) Calculated using the CKD-EPI Creatinine Equation (2021)    Anion gap 09/13/2022 10  5 - 15 Final   Performed at [facility], [place] [address], [place], [place] [address]   Vitamin B-12 09/13/2022 585  180 - 914 pg/mL Final   Comment: (NOTE) This assay is not validated for testing neonatal or myeloproliferative syndrome specimens for Vitamin B12 levels. Performed at [facility], [place] [address], [place], [place] [address]    Color, Urine 09/14/2022 YELLOW  YELLOW Final   APPearance 09/14/2022 HAZY (A)  CLEAR Final   Specific Gravity, Urine 09/14/2022 1.025  1.005 - 1.030 Final   pH 09/14/2022 5.0  5.0 - 8.0 Final   Glucose, UA 09/14/2022 NEGATIVE  NEGATIVE mg/dL Final   Hgb urine dipstick 09/14/2022 NEGATIVE  NEGATIVE Final   Bilirubin Urine 09/14/2022 NEGATIVE  NEGATIVE Final   Ketones, ur 09/14/2022 NEGATIVE  NEGATIVE mg/dL Final   Protein, ur 09/14/2022 NEGATIVE  NEGATIVE mg/dL Final   Nitrite 09/14/2022 NEGATIVE  NEGATIVE Final   Leukocytes,Ua 09/14/2022 TRACE (A)  NEGATIVE Final   RBC / HPF 09/14/2022 0-5  0 - 5 RBC/hpf Final   WBC, UA 09/14/2022 0-5  0 -  5 WBC/hpf  Final   Bacteria, UA 09/14/2022 RARE (A)  NONE SEEN Final   Squamous Epithelial / LPF 09/14/2022 0-5  0 - 5 Final   Mucus 09/14/2022 PRESENT   Final   Performed at [facility], [place] [address], [place], [place] [address]   Glucose-Capillary 09/16/2022 105 (H)  70 - 99 mg/dL Final   Glucose reference range applies only to samples taken after fasting for at least 8 hours.   SARS Coronavirus 2 by RT PCR 09/30/2022 NEGATIVE  NEGATIVE Final   Comment: (NOTE) SARS-CoV-2 target nucleic acids are NOT DETECTED.  The SARS-CoV-2 RNA is generally detectable in upper respiratory specimens during the acute phase of infection. The lowest concentration of SARS-CoV-2 viral copies this assay can detect is 138 copies/mL. A negative result does not preclude SARS-Cov-2 infection and should not be used as the sole basis for treatment or other patient management decisions. A negative result may occur with  improper specimen collection/handling, submission of specimen other than nasopharyngeal swab, presence of viral mutation(s) within the areas targeted by this assay, and inadequate number of viral copies(<138 copies/mL). A negative result must be combined with clinical observations, patient history, and epidemiological information. The expected result is Negative.  Fact Sheet for Patients:  EntrepreneurPulse.com.au  Fact Sheet for Healthcare Providers:  IncredibleEmployment.be  This test is no                          t yet approved or cleared by the [place] FDA and  has been authorized for detection and/or diagnosis of SARS-CoV-2 by FDA under an Emergency Use Authorization (EUA). This EUA will remain  in effect (meaning this test can be used) for the duration of the COVID-19 declaration under Section 564(b)(1) of the Act, 21 U.S.C.section 360bbb-3(b)(1), unless the authorization is terminated  or revoked sooner.       Influenza A by PCR 09/30/2022  NEGATIVE  NEGATIVE Final   Influenza B by PCR 09/30/2022 NEGATIVE  NEGATIVE Final   Comment: (NOTE) The Xpert Xpress SARS-CoV-2/FLU/RSV plus assay is intended as an aid in the diagnosis of influenza from Nasopharyngeal swab specimens and should not be used as a sole basis for treatment. Nasal washings and aspirates are unacceptable for Xpert Xpress SARS-CoV-2/FLU/RSV testing.  Fact Sheet for Patients: EntrepreneurPulse.com.au  Fact Sheet for Healthcare Providers: IncredibleEmployment.be  This test is not yet approved or cleared by the [place] FDA and has been authorized for detection and/or diagnosis of SARS-CoV-2 by FDA under an Emergency Use Authorization (EUA). This EUA will remain in effect (meaning this test can be used) for the duration of the COVID-19 declaration under Section 564(b)(1) of the Act, 21 U.S.C. section 360bbb-3(b)(1), unless the authorization is terminated or revoked.     Resp Syncytial Virus by PCR 09/30/2022 NEGATIVE  NEGATIVE Final   Comment: (NOTE) Fact Sheet for Patients: EntrepreneurPulse.com.au  Fact Sheet for Healthcare Providers: IncredibleEmployment.be  This test is not yet approved or cleared by the [place] FDA and has been authorized for detection and/or diagnosis of SARS-CoV-2 by FDA under an Emergency Use Authorization (EUA). This EUA will remain in effect (meaning this test can be used) for the duration of the COVID-19 declaration under Section 564(b)(1) of the Act, 21 U.S.C. section 360bbb-3(b)(1), unless the authorization is terminated or revoked.  Performed at [facility], [place] [address], [place], [place] [address]    Sodium 10/01/2022 136  135 -  145 mmol/L Final   Potassium 10/01/2022 3.8  3.5 - 5.1 mmol/L Final   Chloride 10/01/2022 104  98 - 111 mmol/L Final   CO2 10/01/2022 27  22 - 32 mmol/L Final   Glucose, Bld 10/01/2022 98  70 - 99  mg/dL Final   Glucose reference range applies only to samples taken after fasting for at least 8 hours.   BUN 10/01/2022 12  6 - 20 mg/dL Final   Creatinine, Ser 10/01/2022 0.98  0.44 - 1.00 mg/dL Final   Calcium 10/01/2022 8.9  8.9 - 10.3 mg/dL Final   GFR, Estimated 10/01/2022 >60  >60 mL/min Final   Comment: (NOTE) Calculated using the CKD-EPI Creatinine Equation (2021)    Anion gap 10/01/2022 5  5 - 15 Final   Performed at [facility], [place] [address], [place], [place] [address]   WBC 10/01/2022 5.1  4.0 - 10.5 K/uL Final   RBC 10/01/2022 4.31  3.87 - 5.11 MIL/uL Final   Hemoglobin 10/01/2022 12.7  12.0 - 15.0 g/dL Final   HCT 10/01/2022 38.8  36.0 - 46.0 % Final   MCV 10/01/2022 90.0  80.0 - 100.0 fL Final   MCH 10/01/2022 29.5  26.0 - 34.0 pg Final   MCHC 10/01/2022 32.7  30.0 - 36.0 g/dL Final   RDW 10/01/2022 12.4  11.5 - 15.5 % Final   Platelets 10/01/2022 300  150 - 400 K/uL Final   nRBC 10/01/2022 0.0  0.0 - 0.2 % Final   Performed at [place] [address] [place], [place] [address]   Lipase 10/01/2022 29  11 - 51 U/L Final   Performed at [place] [address] [place], [place] [address]   Color, Urine 10/05/2022 YELLOW  YELLOW Final   APPearance 10/05/2022 HAZY (A)  CLEAR Final   Specific Gravity, Urine 10/05/2022 1.018  1.005 - 1.030 Final   pH 10/05/2022 5.0  5.0 - 8.0 Final   Glucose, UA 10/05/2022 NEGATIVE  NEGATIVE mg/dL Final   Hgb urine dipstick 10/05/2022 NEGATIVE  NEGATIVE Final   Bilirubin Urine 10/05/2022 NEGATIVE  NEGATIVE Final   Ketones, ur 10/05/2022 NEGATIVE  NEGATIVE mg/dL Final   Protein, ur 10/05/2022 NEGATIVE  NEGATIVE mg/dL Final   Nitrite 10/05/2022 NEGATIVE  NEGATIVE Final   Leukocytes,Ua 10/05/2022 TRACE (A)  NEGATIVE Final   RBC / HPF 10/05/2022 0-5  0 - 5 RBC/hpf Final   WBC, UA 10/05/2022 0-5  0 - 5 WBC/hpf Final   Bacteria, UA 10/05/2022 RARE (A)  NONE SEEN Final   Squamous Epithelial / LPF 10/05/2022 0-5   0 - 5 Final   Mucus 10/05/2022 PRESENT   Final   Performed at [facility], [place] [address] [place], [place] [address]   Specimen Description 10/05/2022 URINE, CLEAN CATCH   Final   Special Requests 10/05/2022    Final                   Value:NONE Performed at [facility], [place] [address], [place], [place] [address]    Culture 10/05/2022 10,000 COLONIES/mL MULTIPLE SPECIES PRESENT, SUGGEST RECOLLECTION (A)   Final   Report Status 10/05/2022 10/07/2022 FINAL   Final  Admission on 07/04/2022, Discharged on 07/05/2022  Component Date Value Ref Range Status   Sodium 07/04/2022 142  135 - 145 mmol/L Final   Potassium 07/04/2022 4.4  3.5 - 5.1 mmol/L Final   Chloride 07/04/2022 109  98 -  111 mmol/L Final   CO2 07/04/2022 26  22 - 32 mmol/L Final   Glucose, Bld 07/04/2022 96  70 - 99 mg/dL Final   Glucose reference range applies only to samples taken after fasting for at least 8 hours.   BUN 07/04/2022 15  6 - 20 mg/dL Final   Creatinine, Ser 07/04/2022 0.83  0.44 - 1.00 mg/dL Final   Calcium 07/04/2022 9.3  8.9 - 10.3 mg/dL Final   Total Protein 07/04/2022 7.2  6.5 - 8.1 g/dL Final   Albumin 07/04/2022 3.9  3.5 - 5.0 g/dL Final   AST 07/04/2022 23  15 - 41 U/L Final   ALT 07/04/2022 28  0 - 44 U/L Final   Alkaline Phosphatase 07/04/2022 77  38 - 126 U/L Final   Total Bilirubin 07/04/2022 0.4  0.3 - 1.2 mg/dL Final   GFR, Estimated 07/04/2022 >60  >60 mL/min Final   Comment: (NOTE) Calculated using the CKD-EPI Creatinine Equation (2021)    Anion gap 07/04/2022 7  5 - 15 Final   Performed at [facility], [place] [address], [place], [place] [address]   Alcohol, Ethyl (B) 07/04/2022 <10  <10 mg/dL Final   Comment: (NOTE) Lowest detectable limit for serum alcohol is 10 mg/dL.  For medical purposes only. Performed at [facility], [place] [address] [place], [place] [address]    WBC 07/04/2022 7.0  4.0 - 10.5 K/uL Final   RBC 07/04/2022 4.18   3.87 - 5.11 MIL/uL Final   Hemoglobin 07/04/2022 12.8  12.0 - 15.0 g/dL Final   HCT 07/04/2022 39.1  36.0 - 46.0 % Final   MCV 07/04/2022 93.5  80.0 - 100.0 fL Final   MCH 07/04/2022 30.6  26.0 - 34.0 pg Final   MCHC 07/04/2022 32.7  30.0 - 36.0 g/dL Final   RDW 07/04/2022 12.9  11.5 - 15.5 % Final   Platelets 07/04/2022 243  150 - 400 K/uL Final   nRBC 07/04/2022 0.0  0.0 - 0.2 % Final   Neutrophils Relative % 07/04/2022 43  % Final   Neutro Abs 07/04/2022 3.0  1.7 - 7.7 K/uL Final   Lymphocytes Relative 07/04/2022 50  % Final   Lymphs Abs 07/04/2022 3.5  0.7 - 4.0 K/uL Final   Monocytes Relative 07/04/2022 7  % Final   Monocytes Absolute 07/04/2022 0.5  0.1 - 1.0 K/uL Final   Eosinophils Relative 07/04/2022 0  % Final   Eosinophils Absolute 07/04/2022 0.0  0.0 - 0.5 K/uL Final   Basophils Relative 07/04/2022 0  % Final   Basophils Absolute 07/04/2022 0.0  0.0 - 0.1 K/uL Final   Immature Granulocytes 07/04/2022 0  % Final   Abs Immature Granulocytes 07/04/2022 0.01  0.00 - 0.07 K/uL Final   Performed at [facility], [place] [address] [place], [place] [address]   I-stat hCG, quantitative 07/04/2022 <5.0  <5 mIU/mL Final   Comment 3 07/04/2022          Final   Comment:   GEST. AGE      CONC.  (mIU/mL)   <=1 WEEK        5 - 50     2 WEEKS       50 - 500     3 WEEKS       100 - 10,000     4 WEEKS     1,000 - 30,000        FEMALE AND NON-PREGNANT FEMALE:  LESS THAN 5 mIU/mL   Admission on 06/14/2022, Discharged on 06/14/2022  Component Date Value Ref Range Status   Color, UA 06/14/2022 yellow  yellow Final   Clarity, UA 06/14/2022 cloudy (A)  clear Final   Glucose, UA 06/14/2022 negative  negative mg/dL Final   Bilirubin, UA 06/14/2022 negative  negative Final   Ketones, POC UA 06/14/2022 negative  negative mg/dL Final   Spec Grav, UA 06/14/2022 1.025  1.010 - 1.025 Final   Blood, UA 06/14/2022 negative  negative Final   pH, UA 06/14/2022 7.5  5.0 - 8.0 Final    Protein Ur, POC 06/14/2022 =30 (A)  negative mg/dL Final   Urobilinogen, UA 06/14/2022 0.2  0.2 or 1.0 E.U./dL Final   Nitrite, UA 06/14/2022 Negative  Negative Final   Leukocytes, UA 06/14/2022 Negative  Negative Final   Preg Test, Ur 06/14/2022 Negative  Negative Final   Specimen Description 06/14/2022 URINE, CLEAN CATCH   Final   Special Requests 06/14/2022    Final                   Value:NONE Performed at [facility], [place] [address], [place], [place] [address]    Culture 06/14/2022 MULTIPLE SPECIES PRESENT, SUGGEST RECOLLECTION (A)   Final   Report Status 06/14/2022 06/16/2022 FINAL   Final  Admission on 06/07/2022, Discharged on 06/10/2022  Component Date Value Ref Range Status   SARS Coronavirus 2 by RT PCR 06/07/2022 NEGATIVE  NEGATIVE Final   Comment: (NOTE) SARS-CoV-2 target nucleic acids are NOT DETECTED.  The SARS-CoV-2 RNA is generally detectable in upper respiratory specimens during the acute phase of infection. The lowest concentration of SARS-CoV-2 viral copies this assay can detect is 138 copies/mL. A negative result does not preclude SARS-Cov-2 infection and should not be used as the sole basis for treatment or other patient management decisions. A negative result may occur with  improper specimen collection/handling, submission of specimen other than nasopharyngeal swab, presence of viral mutation(s) within the areas targeted by this assay, and inadequate number of viral copies(<138 copies/mL). A negative result must be combined with clinical observations, patient history, and epidemiological information. The expected result is Negative.  Fact Sheet for Patients:  EntrepreneurPulse.com.au  Fact Sheet for Healthcare Providers:  IncredibleEmployment.be  This test is no                          t yet approved or cleared by the [place] FDA and  has been authorized for detection and/or diagnosis of SARS-CoV-2  by FDA under an Emergency Use Authorization (EUA). This EUA will remain  in effect (meaning this test can be used) for the duration of the COVID-19 declaration under Section 564(b)(1) of the Act, 21 U.S.C.section 360bbb-3(b)(1), unless the authorization is terminated  or revoked sooner.       Influenza A by PCR 06/07/2022 NEGATIVE  NEGATIVE Final   Influenza B by PCR 06/07/2022 NEGATIVE  NEGATIVE Final   Comment: (NOTE) The Xpert Xpress SARS-CoV-2/FLU/RSV plus assay is intended as an aid in the diagnosis of influenza from Nasopharyngeal swab specimens and should not be used as a sole basis for treatment. Nasal washings and aspirates are unacceptable for Xpert Xpress SARS-CoV-2/FLU/RSV testing.  Fact Sheet for Patients: EntrepreneurPulse.com.au  Fact Sheet for Healthcare Providers: IncredibleEmployment.be  This test is not yet approved or cleared by the [place] FDA and has been authorized for detection and/or diagnosis of SARS-CoV-2 by FDA  under an Emergency Use Authorization (EUA). This EUA will remain in effect (meaning this test can be used) for the duration of the COVID-19 declaration under Section 564(b)(1) of the Act, 21 U.S.C. section 360bbb-3(b)(1), unless the authorization is terminated or revoked.  Performed at [facility], [place] [address] [place], [place] [address]    WBC 06/07/2022 5.7  4.0 - 10.5 K/uL Final   RBC 06/07/2022 4.39  3.87 - 5.11 MIL/uL Final   Hemoglobin 06/07/2022 13.2  12.0 - 15.0 g/dL Final   HCT 06/07/2022 40.4  36.0 - 46.0 % Final   MCV 06/07/2022 92.0  80.0 - 100.0 fL Final   MCH 06/07/2022 30.1  26.0 - 34.0 pg Final   MCHC 06/07/2022 32.7  30.0 - 36.0 g/dL Final   RDW 06/07/2022 12.4  11.5 - 15.5 % Final   Platelets 06/07/2022 308  150 - 400 K/uL Final   nRBC 06/07/2022 0.0  0.0 - 0.2 % Final   Neutrophils Relative % 06/07/2022 42  % Final   Neutro Abs 06/07/2022 2.4  1.7 - 7.7 K/uL Final    Lymphocytes Relative 06/07/2022 54  % Final   Lymphs Abs 06/07/2022 3.1  0.7 - 4.0 K/uL Final   Monocytes Relative 06/07/2022 4  % Final   Monocytes Absolute 06/07/2022 0.2  0.1 - 1.0 K/uL Final   Eosinophils Relative 06/07/2022 0  % Final   Eosinophils Absolute 06/07/2022 0.0  0.0 - 0.5 K/uL Final   Basophils Relative 06/07/2022 0  % Final   Basophils Absolute 06/07/2022 0.0  0.0 - 0.1 K/uL Final   Immature Granulocytes 06/07/2022 0  % Final   Abs Immature Granulocytes 06/07/2022 0.01  0.00 - 0.07 K/uL Final   Performed at [facility], [place] [address] [place], [place] [address]   Sodium 06/07/2022 139  135 - 145 mmol/L Final   Potassium 06/07/2022 4.0  3.5 - 5.1 mmol/L Final   Chloride 06/07/2022 104  98 - 111 mmol/L Final   CO2 06/07/2022 28  22 - 32 mmol/L Final   Glucose, Bld 06/07/2022 104 (H)  70 - 99 mg/dL Final   Glucose reference range applies only to samples taken after fasting for at least 8 hours.   BUN 06/07/2022 8  6 - 20 mg/dL Final   Creatinine, Ser 06/07/2022 0.84  0.44 - 1.00 mg/dL Final   Calcium 06/07/2022 9.1  8.9 - 10.3 mg/dL Final   Total Protein 06/07/2022 6.9  6.5 - 8.1 g/dL Final   Albumin 06/07/2022 3.7  3.5 - 5.0 g/dL Final   AST 06/07/2022 19  15 - 41 U/L Final   ALT 06/07/2022 22  0 - 44 U/L Final   Alkaline Phosphatase 06/07/2022 54  38 - 126 U/L Final   Total Bilirubin 06/07/2022 0.4  0.3 - 1.2 mg/dL Final   GFR, Estimated 06/07/2022 >60  >60 mL/min Final   Comment: (NOTE) Calculated using the CKD-EPI Creatinine Equation (2021)    Anion gap 06/07/2022 7  5 - 15 Final   Performed at [facility] [address], [place], [place] [address]   Hgb A1c MFr Bld 06/07/2022 5.0  4.8 - 5.6 % Final   Comment: (NOTE) Pre diabetes:          5.7%-6.4%  Diabetes:              >6.4%  Glycemic control for   <7.0% adults with diabetes    Mean Plasma Glucose 06/07/2022 96.8  mg/dL Final   Performed at [facility], [place] [address],  [place], [place] [address]   TSH 06/07/2022 1.620  0.350 - 4.500 uIU/mL Final   Comment: Performed by a 3rd Generation assay with a functional sensitivity of <=0.01 uIU/mL. Performed at [facility], [place] [address], [place], [place] [address]    RPR Ser Ql 06/07/2022 NON REACTIVE  NON REACTIVE Final   Performed at [facility], [place] [address] [place], [place] [address]   Color, Urine 06/07/2022 YELLOW  YELLOW Final   APPearance 06/07/2022 HAZY (A)  CLEAR Final   Specific Gravity, Urine 06/07/2022 1.018  1.005 - 1.030 Final   pH 06/07/2022 7.0  5.0 - 8.0 Final   Glucose, UA 06/07/2022 NEGATIVE  NEGATIVE mg/dL Final   Hgb urine dipstick 06/07/2022 NEGATIVE  NEGATIVE Final   Bilirubin Urine 06/07/2022 NEGATIVE  NEGATIVE Final   Ketones, ur 06/07/2022 NEGATIVE  NEGATIVE mg/dL Final   Protein, ur 06/07/2022 NEGATIVE  NEGATIVE mg/dL Final   Nitrite 06/07/2022 NEGATIVE  NEGATIVE Final   Leukocytes,Ua 06/07/2022 NEGATIVE  NEGATIVE Final   Performed at [facility], [place] [address], [place], [place] [address]   Cholesterol 06/07/2022 164  0 - 200 mg/dL Final   Triglycerides 06/07/2022 158 (H)  <150 mg/dL Final   HDL 06/07/2022 44  >40 mg/dL Final   Total CHOL/HDL Ratio 06/07/2022 3.7  RATIO Final   VLDL 06/07/2022 32  0 - 40 mg/dL Final   LDL Cholesterol 06/07/2022 88  0 - 99 mg/dL Final   Comment:        Total Cholesterol/HDL:CHD Risk Coronary Heart Disease Risk Table                     Men   Women  1/2 Average Risk   3.4   3.3  Average Risk       5.0   4.4  2 X Average Risk   9.6   7.1  3 X Average Risk  23.4   11.0        Use the calculated Patient Ratio above and the CHD Risk Table to determine the patient's CHD Risk.        ATP III CLASSIFICATION (LDL):  <100     mg/dL   Optimal  100-129  mg/dL   Near or Above                    Optimal  130-159  mg/dL   Borderline  160-189  mg/dL   High  >190     mg/dL   Very High Performed at [place]  [address] [place], [place] [address]    HIV Screen 4th Generation wRfx 06/07/2022 Non Reactive  Non Reactive Final   Performed at [facility], [place] [address], [place], [place] [address]   SARSCOV2ONAVIRUS 2 AG 06/07/2022 NEGATIVE  NEGATIVE Final   Comment: (NOTE) SARS-CoV-2 antigen NOT DETECTED.   Negative results are presumptive.  Negative results do not preclude SARS-CoV-2 infection and should not be used as the sole basis for treatment or other patient management decisions, including infection  control decisions, particularly in the presence of clinical signs and  symptoms consistent with COVID-19, or in those who have been in contact with the virus.  Negative results must be combined with clinical observations, patient history, and epidemiological information. The expected result is Negative.  Fact Sheet for Patients: HandmadeRecipes.com.cy  Fact Sheet for Healthcare Providers: https://alexander-rogers.biz/https://www.fda.gov/media/141568/download  This test is not yet approved or cleared by the [place] FDA and  has been authorized for detection and/or diagnosis of SARS-CoV-2 by FDA under an Emergency Use Authorization (EUA).  This EUA will remain in effect (meaning this test can be used) for the duration of  the COV                          ID-19 declaration under Section 564(b)(1) of the Act, 21 U.S.C. section 360bbb-3(b)(1), unless the authorization is terminated or revoked sooner.     POC Amphetamine UR 06/07/2022 None Detected  NONE DETECTED (Cut Off Level 1000 ng/mL) Final   POC Secobarbital (BAR) 06/07/2022 None Detected  NONE DETECTED (Cut Off Level 300 ng/mL) Final   POC Buprenorphine (BUP) 06/07/2022 None Detected  NONE DETECTED (Cut Off Level 10 ng/mL) Final   POC Oxazepam (BZO) 06/07/2022 None Detected  NONE DETECTED (Cut Off Level 300 ng/mL) Final   POC Cocaine UR 06/07/2022 None Detected  NONE DETECTED (Cut Off Level 300 ng/mL) Final   POC Methamphetamine UR  06/07/2022 None Detected  NONE DETECTED (Cut Off Level 1000 ng/mL) Final   POC Morphine 06/07/2022 None Detected  NONE DETECTED (Cut Off Level 300 ng/mL) Final   POC Methadone UR 06/07/2022 None Detected  NONE DETECTED (Cut Off Level 300 ng/mL) Final   POC Oxycodone UR 06/07/2022 None Detected  NONE DETECTED (Cut Off Level 100 ng/mL) Final   POC Marijuana UR 06/07/2022 None Detected  NONE DETECTED (Cut Off Level 50 ng/mL) Final   Preg Test, Ur 06/08/2022 NEGATIVE  NEGATIVE Final   Comment:        THE SENSITIVITY OF THIS METHODOLOGY IS >20 mIU/mL. Performed at [facility], [address] [place], [place] [address]    Preg Test, Ur 06/08/2022 NEGATIVE  NEGATIVE Final   Comment:        THE SENSITIVITY OF THIS METHODOLOGY IS >24 mIU/mL     Blood Alcohol level:  Lab Results  Component Value Date   ETH <10 07/04/2022   ETH <10 02/07/2022    Metabolic Disorder Labs: Lab Results  Component Value Date   HGBA1C 5.0 06/07/2022   MPG 96.8 06/07/2022   MPG 96.8 02/07/2022   No results found for: "PROLACTIN" Lab Results  Component Value Date   CHOL 181 07/25/2022   TRIG 118 07/25/2022   HDL 45 07/25/2022   CHOLHDL 4.0 07/25/2022   VLDL 24 07/25/2022   LDLCALC 112 (H) 07/25/2022   LDLCALC 88 06/07/2022    Therapeutic Lab Levels: No results found for: "LITHIUM" No results found for: "VALPROATE" No results found for: "CBMZ"  Physical Findings   GAD-7    Flowsheet Row [facility]  Total GAD-7 Score 17      PHQ2-9    Flowsheet Row [facility]  PHQ-2 Total Score 1 2  PHQ-9 Total Score 2 8      Flowsheet Row [facility] Most recent reading at 10/07/2022  8:16 PM ED [facility]07[facility]20[facility] Most  recent reading at 10/07/2022 12:18 PM ED [facility] 07[facility]25/2022 [facility] Most recent reading at 10/06/2022  9:38 AM  C-SSRS RISK CATEGORY  No Risk No Risk No Risk        Musculoskeletal  Strength & Muscle Tone: within normal limits Gait & Station: normal Patient leans: N/A   Psychiatric Specialty Exam  Presentation  General Appearance:  Appropriate for Environment; Casual  Eye Contact: Good  Speech: Clear and Coherent; Normal Rate  Speech Volume: Normal  Handedness: Right   Mood and Affect  Mood: Euthymic  Affect: Appropriate; Congruent   Thought Process  Thought Processes: Coherent; Goal Directed; Linear  Descriptions of Associations:Intact  Orientation:Full (Time, Place and Person)  Thought Content:Logical  Diagnosis of Schizophrenia or Schizoaffective disorder in past: Yes    Hallucinations:Hallucinations: None  Ideas of Reference:None  Suicidal Thoughts:Suicidal Thoughts: No  Homicidal Thoughts:Homicidal Thoughts: No   Sensorium  Memory: Immediate Good; Recent Fair  Judgment: Intact  Insight: Present   Executive Functions  Concentration: Good  Attention Span: Good  Recall: Good  Fund of Knowledge: Fair  Language: Fair   Psychomotor Activity  Psychomotor Activity: Psychomotor Activity: Normal   Assets  Assets: Communication Skills; Desire for Improvement; Leisure Time; Physical Health; Resilience; Social Support   Sleep  Sleep: Sleep: Good   No data recorded  Physical Exam  Physical Exam Vitals and nursing note reviewed.  Constitutional:      Appearance: Normal appearance. She is well-developed.  HENT:     Head: Normocephalic and atraumatic.     Nose: Nose normal.  Cardiovascular:     Rate and Rhythm: Normal rate.  Pulmonary:     Effort: Pulmonary effort is normal.  Musculoskeletal:        General: Normal range of motion.     Cervical back: Normal range of motion.   Skin:    General: Skin is warm and dry.  Neurological:     Mental Status: She is alert and oriented to person, place, and time.  Psychiatric:        Attention and Perception: Attention and perception normal.        Mood and Affect: Mood and affect normal.        Speech: Speech normal.        Behavior: Behavior normal. Behavior is cooperative.        Thought Content: Thought content normal.    Review of Systems  Constitutional: Negative.   HENT: Negative.    Eyes: Negative.   Respiratory: Negative.    Cardiovascular: Negative.   Gastrointestinal: Negative.   Genitourinary: Negative.   Musculoskeletal: Negative.   Skin: Negative.   Neurological: Negative.   Psychiatric/Behavioral: Negative.     Blood pressure 92/61, pulse 94, temperature 98.4 F (36.9 C), temperature source Oral, resp. rate 16, height 5\' 1"  (1.549 m), weight 198 lb (89.8 kg), SpO2 100 %. Body mass index is 37.41 kg/m.  Treatment Plan Summary: Patient reviewed with Dr. Hampton Abbot.  Patient remains cleared by psychiatry.  TOC team continues to seek disposition.  Daily contact with patient to assess and evaluate symptoms and progress in treatment  Lucky Rathke, FNP 10/11/2022 9:34 AM

## 2022-10-11 NOTE — ED Notes (Signed)
Pt sleeping@this time. Breathing even and unlabored. Will continue to monitor for safety 

## 2022-10-11 NOTE — Care Management (Signed)
O[facility] Care Management  Treatment Team Meeting    In attendance:  Loraine Leriche - [facility], Supervisor  Rodena Medin - Dietitian for Health and Human Services Renato Shin - Owner of [facility]  Mercie  - IDD [facility] Manager   During the meeting, Huston Foley reported that the patient's guardian has not signed the PCP. Therefore, Huston Foley is not able to submit an authorization for services without the signature from the guardian.   Per the owner of [facility] there is an issues with the billing code for services because the patient has [employer] and not an Risk analyst.    There is a court date on October 21, 2022 to have the guardianship switched from the mother to the father.    Social Services will be closed on Monday October 14, 2022 and the next follow up meeting will be on October 16, 2021.  Action Items:  Mercie will speak to the billing department with [facility] through [employer] Caroli) to ensure that they are able to bill for additional funding through  [employer]) [employer] to ensure that she is able to keep this placement   2.  Mercie will continue to call the father Mathis Fare on his work        and cell phone in order to get the legal guardian to sign the PCP plan       and scan it back to her.  3.  Meric will contact the CM services [employer] Carol[employer]) to seek if this patient       can be placed with at [facility] for respite until the       authorization has been completed.   4    Rodena Medin will have one of his social workers to take the PCP        signature page to the legal guardian to obtain her signature.    5.  Merci will contact another group home ([facility]) in order to have a back       up plan to have the patient to go to this facility for respite and then long       term placement in the event that [facility] is not       able to take the patient .

## 2022-10-12 NOTE — ED Notes (Signed)
Pt up to nurses station, joking with staff. Pleasant and cooperative. Pt is safe.

## 2022-10-12 NOTE — ED Notes (Signed)
Pt sleeping@this time. Breathing even and unlabored. Will continue to monitor for safety 

## 2022-10-12 NOTE — ED Notes (Signed)
Patient alert and oriented x 4. Denies SI, HI and AVH. Patient interacting appropriately with peer while eating snack and watching television. Will continue to monitor and update as needed

## 2022-10-12 NOTE — ED Notes (Signed)
Assumed care of patient. Patient is in no acute distress, respirations even and unlabored. Pt able to make needs known, remains boarding for placement. Pt is safe.

## 2022-10-12 NOTE — ED Provider Notes (Cosign Needed)
Behavioral Health Progress Note  Date and Time: 10/12/2022 11:30 AM Name: Nicole [name] MRN:  [id number]   Diagnosis:  Final diagnoses:  Tobacco use disorder  Schizophrenia, unspecified type (HCC)  MDD (major depressive disorder), recurrent episode, moderate (HCC)  Hypothyroidism, unspecified type    Total Time spent with patient: 20 minutes  Nicole [name] 31 y.o., female patient with schizophrenia, MDD with psychosis,  presented to [facility], voluntarily, as a walk-in accompanied by [employer] after a disagreement and verbal altercation with personnel at the group home.  She has been dismissed previously from other group homes due to n which she was residing in.  Patient was admitted to continuous assessment unit on 07/25/2022 as she also endorsed passive SI at that time along with symptoms of visual hallucination. Patient was subsequently psychiatrically cleared during her admission to the continuous assessment unit.  However patient remains here at [facility] border[facility] until adequate- residential placement can be obtained.    Legal Guardian: Mom Nicole [name]) transitioning to be Dad Nicole [name]) Point of contact: Dad Nicole [name])  Nicole [name], 62 y.o., female patient seen face to face by this provider, consulted with Dr. Lucianne Muss; and chart reviewed on 10/12/22.  On reevaluation Nicole [name] reports, that she is feeling well.  She denies any symptoms of depression, anxiety, SI, HI, AH, VH.  Patient  reports that she has been writing and drawing and continues to endorse that she is looking forward to being discharged whenever the time comes. Patient denies any acute concerns however reports to this writer that her toenails are causing discomfort due to length.    During evaluation Nicole [name] is standing beside this writer, in no acute distress. he is alert, oriented x 4, calm, cooperative and attentive.  /Her mood is euthymic with congruent affect. She has normal speech, and behavior.   Objectively there is no evidence of psychosis/mania or delusional thinking.  Patient is able to converse coherently, goal directed thoughts, no distractibility, or pre-occupation.  She also denies suicidal/self-harm/homicidal ideation, psychosis, and paranoia.  Patient answered question appropriately.   Patient remains psychiatrically cleared and is awaiting residential placement.     Past Medical History:  Past Medical History:  Diagnosis Date   Anxiety    Depression    Hypothyroidism 08/07/2022   Tobacco use disorder 08/07/2022    Past Surgical History:  Procedure Laterality Date   WISDOM TOOTH EXTRACTION Bilateral 2020   Family History:  Family History  Problem Relation Age of Onset   Hypertension Father    Diabetes Father     Social History:  Social History   Substance and Sexual Activity  Alcohol Use Never     Social History   Substance and Sexual Activity  Drug Use Never    Social History   Socioeconomic History   Marital status: Single    Spouse name: Not on file   Number of children: Not on file   Years of education: Not on file   Highest education level: Not on file  Occupational History   Not on file  Tobacco Use   Smoking status: Every Day    Types: Cigarettes   Smokeless tobacco: Never  Vaping Use   Vaping Use: Some days  Substance and Sexual Activity   Alcohol use: Never   Drug use: Never   Sexual activity: Yes    Partners: Female    Birth control/protection: Implant  Other Topics Concern   Not on file  Social History Narrative  Not on file   Social Determinants of Health   Financial Resource Strain: Not on file  Food Insecurity: Not on file  Transportation Needs: Not on file  Physical Activity: Not on file  Stress: Not on file  Social Connections: Not on file   SDOH:  SDOH Screenings   Depression (PHQ2-9): Low Risk  (06/10/2022)  Tobacco Use: High Risk (10/07/2022)   Additional Social History:            Current  Medications:  Current Facility-Administered Medications  Medication Dose Route Frequency Provider Last Rate Last Admin   acetaminophen (TYLENOL) tablet 650 mg  650 mg Oral Q6H PRN Marlou Sa, NP       alum & mag hydroxide-simeth (MAALOX/MYLANTA) 200-200-20 MG/5ML suspension 30 mL  30 mL Oral Q4H PRN Rayburn Go, Veronique M, NP   30 mL at 10/10/22 1659   docusate sodium (COLACE) capsule 100 mg  100 mg Oral Daily Carrion-Carrero, Karle Starch, MD   100 mg at 10/12/22 0955   hydrOXYzine (ATARAX) tablet 25 mg  25 mg Oral TID PRN Lorri Frederick, MD   25 mg at 10/10/22 2136   levothyroxine (SYNTHROID) tablet 100 mcg  100 mcg Oral Once Byungura, Veronique M, NP       levothyroxine (SYNTHROID) tablet 100 mcg  100 mcg Oral Q0600 Marlou Sa, NP   100 mcg at 10/12/22 0602   loratadine (CLARITIN) tablet 10 mg  10 mg Oral Daily Carrion-Carrero, Karle Starch, MD   10 mg at 10/12/22 0955   magnesium hydroxide (MILK OF MAGNESIA) suspension 30 mL  30 mL Oral Daily PRN Marlou Sa, NP       metFORMIN (GLUCOPHAGE) tablet 500 mg  500 mg Oral Q breakfast Carrion-Carrero, Margely, MD   500 mg at 10/12/22 2440   nicotine (NICODERM CQ - dosed in mg/24 hours) patch 14 mg  14 mg Transdermal Daily PRN Carrion-Carrero, Margely, MD       nitrofurantoin (macrocrystal-monohydrate) (MACROBID) capsule 100 mg  100 mg Oral Q12H Rayburn Go, Veronique M, NP   100 mg at 10/12/22 0955   ondansetron (ZOFRAN-ODT) disintegrating tablet 4 mg  4 mg Oral Q8H PRN Carrion-Carrero, Karle Starch, MD   4 mg at 10/10/22 1312   Oxcarbazepine (TRILEPTAL) tablet 300 mg  300 mg Oral BID Rayburn Go, Veronique M, NP   300 mg at 10/12/22 0955   pantoprazole (PROTONIX) EC tablet 40 mg  40 mg Oral Daily Carrion-Carrero, Margely, MD   40 mg at 10/12/22 0955   QUEtiapine (SEROQUEL) tablet 400 mg  400 mg Oral BID Rayburn Go, Veronique M, NP   400 mg at 10/12/22 0955   sertraline (ZOLOFT) tablet 150 mg  150 mg Oral Daily Carrion-Carrero,  Karle Starch, MD   150 mg at 10/12/22 0955   traZODone (DESYREL) tablet 100 mg  100 mg Oral QHS Olin Pia M, NP   100 mg at 10/11/22 2140   traZODone (DESYREL) tablet 50 mg  50 mg Oral QHS PRN Marlou Sa, NP   50 mg at 10/11/22 2144   valACYclovir (VALTREX) tablet 500 mg  500 mg Oral Daily Carrion-Carrero, Karle Starch, MD   500 mg at 10/12/22 1027   Current Outpatient Medications  Medication Sig Dispense Refill   ABILIFY MAINTENA 400 MG PRSY prefilled syringe 400 mg every 28 (twenty-eight) days.     cetirizine (ZYRTEC) 10 MG tablet Take 10 mg by mouth daily.     cyclobenzaprine (FLEXERIL) 10 MG tablet Take 1 tablet (10 mg total) by mouth 2 (  two) times daily as needed for muscle spasms. 20 tablet 0   fluticasone (FLONASE) 50 MCG/ACT nasal spray Place 1 spray into both nostrils daily.     meloxicam (MOBIC) 15 MG tablet Take 15 mg by mouth daily.     nitrofurantoin, macrocrystal-monohydrate, (MACROBID) 100 MG capsule Take 1 capsule (100 mg total) by mouth 2 (two) times daily. 10 capsule 0   ondansetron (ZOFRAN-ODT) 4 MG disintegrating tablet Take 1 tablet (4 mg total) by mouth every 8 (eight) hours as needed for nausea or vomiting. 20 tablet 0   Oxcarbazepine (TRILEPTAL) 300 MG tablet Take 1 tablet (300 mg total) by mouth 2 (two) times daily. 60 tablet 0   QUEtiapine (SEROQUEL) 400 MG tablet Take 1 tablet (400 mg total) by mouth 2 (two) times daily. 60 tablet 0   sertraline (ZOLOFT) 50 MG tablet Take 3 tablets (150 mg total) by mouth in the morning. 90 tablet 0   traZODone (DESYREL) 100 MG tablet Take 1 tablet (100 mg total) by mouth at bedtime. 30 tablet 0   valACYclovir (VALTREX) 500 MG tablet Take 500 mg by mouth daily.      Labs  Lab Results:  Admission on 10/07/2022, Discharged on 10/07/2022  Component Date Value Ref Range Status   Color, Urine 10/07/2022 YELLOW  YELLOW Final   APPearance 10/07/2022 HAZY (A)  CLEAR Final   Specific Gravity, Urine 10/07/2022 1.011  1.005 -  1.030 Final   pH 10/07/2022 7.0  5.0 - 8.0 Final   Glucose, UA 10/07/2022 NEGATIVE  NEGATIVE mg/dL Final   Hgb urine dipstick 10/07/2022 SMALL (A)  NEGATIVE Final   Bilirubin Urine 10/07/2022 NEGATIVE  NEGATIVE Final   Ketones, ur 10/07/2022 NEGATIVE  NEGATIVE mg/dL Final   Protein, ur 53/66/4403 NEGATIVE  NEGATIVE mg/dL Final   Nitrite 47/42/5956 NEGATIVE  NEGATIVE Final   Leukocytes,Ua 10/07/2022 LARGE (A)  NEGATIVE Final   RBC / HPF 10/07/2022 0-5  0 - 5 RBC/hpf Final   WBC, UA 10/07/2022 0-5  0 - 5 WBC/hpf Final   Bacteria, UA 10/07/2022 FEW (A)  NONE SEEN Final   Squamous Epithelial / LPF 10/07/2022 11-20  0 - 5 Final   Performed at [facility], [address] [place], [place] [address]   I-stat hCG, quantitative 10/07/2022 <5.0  <5 mIU/mL Final   Comment 3 10/07/2022          Final   Comment:   GEST. AGE      CONC.  (mIU/mL)   <=1 WEEK        5 - 50     2 WEEKS       50 - 500     3 WEEKS       100 - 10,000     4 WEEKS     1,000 - 30,000        FEMALE AND NON-PREGNANT FEMALE:     LESS THAN 5 mIU/mL    Sodium 10/07/2022 138  135 - 145 mmol/L Final   Potassium 10/07/2022 4.3  3.5 - 5.1 mmol/L Final   Chloride 10/07/2022 104  98 - 111 mmol/L Final   CO2 10/07/2022 28  22 - 32 mmol/L Final   Glucose, Bld 10/07/2022 90  70 - 99 mg/dL Final   Glucose reference range applies only to samples taken after fasting for at least 8 hours.   BUN 10/07/2022 8  6 - 20 mg/dL Final   Creatinine, Ser 10/07/2022 0.96  0.44 - 1.00  mg/dL Final   Calcium 78/29/5621 9.1  8.9 - 10.3 mg/dL Final   GFR, Estimated 10/07/2022 >60  >60 mL/min Final   Comment: (NOTE) Calculated using the CKD-EPI Creatinine Equation (2021)    Anion gap 10/07/2022 6  5 - 15 Final   Performed at [facility], [address], [place], [place] [address]   WBC 10/07/2022 5.8  4.0 - 10.5 K/uL Final   RBC 10/07/2022 4.36  3.87 - 5.11 MIL/uL Final   Hemoglobin 10/07/2022 12.9  12.0 - 15.0 g/dL Final   HCT  78/46/9629 40.0  36.0 - 46.0 % Final   MCV 10/07/2022 91.7  80.0 - 100.0 fL Final   MCH 10/07/2022 29.6  26.0 - 34.0 pg Final   MCHC 10/07/2022 32.3  30.0 - 36.0 g/dL Final   RDW 52/84/1324 12.4  11.5 - 15.5 % Final   Platelets 10/07/2022 295  150 - 400 K/uL Final   nRBC 10/07/2022 0.0  0.0 - 0.2 % Final   Performed at [facility], [address] [place], [place] [address]   SARS Coronavirus 2 by RT PCR 10/07/2022 NEGATIVE  NEGATIVE Final   Comment: (NOTE) SARS-CoV-2 target nucleic acids are NOT DETECTED.  The SARS-CoV-2 RNA is generally detectable in upper respiratory specimens during the acute phase of infection. The lowest concentration of SARS-CoV-2 viral copies this assay can detect is 138 copies/mL. A negative result does not preclude SARS-Cov-2 infection and should not be used as the sole basis for treatment or other patient management decisions. A negative result may occur with  improper specimen collection/handling, submission of specimen other than nasopharyngeal swab, presence of viral mutation(s) within the areas targeted by this assay, and inadequate number of viral copies(<138 copies/mL). A negative result must be combined with clinical observations, patient history, and epidemiological information. The expected result is Negative.  Fact Sheet for Patients:  BloggerCourse.com  Fact Sheet for Healthcare Providers:  SeriousBroker.it  This test is no                          t yet approved or cleared by the [place] FDA and  has been authorized for detection and/or diagnosis of SARS-CoV-2 by FDA under an Emergency Use Authorization (EUA). This EUA will remain  in effect (meaning this test can be used) for the duration of the COVID-19 declaration under Section 564(b)(1) of the Act, 21 U.S.C.section 360bbb-3(b)(1), unless the authorization is terminated  or revoked sooner.       Influenza A by PCR  10/07/2022 NEGATIVE  NEGATIVE Final   Influenza B by PCR 10/07/2022 NEGATIVE  NEGATIVE Final   Comment: (NOTE) The Xpert Xpress SARS-CoV-2/FLU/RSV plus assay is intended as an aid in the diagnosis of influenza from Nasopharyngeal swab specimens and should not be used as a sole basis for treatment. Nasal washings and aspirates are unacceptable for Xpert Xpress SARS-CoV-2/FLU/RSV testing.  Fact Sheet for Patients: BloggerCourse.com  Fact Sheet for Healthcare Providers: SeriousBroker.it  This test is not yet approved or cleared by the [place] FDA and has been authorized for detection and/or diagnosis of SARS-CoV-2 by FDA under an Emergency Use Authorization (EUA). This EUA will remain in effect (meaning this test can be used) for the duration of the COVID-19 declaration under Section 564(b)(1) of the Act, 21 U.S.C. section 360bbb-3(b)(1), unless the authorization is terminated or revoked.     Resp Syncytial Virus by PCR 10/07/2022 NEGATIVE  NEGATIVE Final  Comment: (NOTE) Fact Sheet for Patients: BloggerCourse.com  Fact Sheet for Healthcare Providers: SeriousBroker.it  This test is not yet approved or cleared by the [place] FDA and has been authorized for detection and/or diagnosis of SARS-CoV-2 by FDA under an Emergency Use Authorization (EUA). This EUA will remain in effect (meaning this test can be used) for the duration of the COVID-19 declaration under Section 564(b)(1) of the Act, 21 U.S.C. section 360bbb-3(b)(1), unless the authorization is terminated or revoked.  Performed at [facility], [address], [place], [place] [address]    Specimen Description 10/07/2022 URINE, CLEAN CATCH   Final   Special Requests 10/07/2022    Final                   Value:NONE Performed at [facility], [address] [place], [place] [address]    Culture  10/07/2022 MULTIPLE SPECIES PRESENT, SUGGEST RECOLLECTION (A)   Final   Report Status 10/07/2022 10/08/2022 FINAL   Final  Admission on 07/25/2022, Discharged on 10/07/2022  Component Date Value Ref Range Status   SARS Coronavirus 2 by RT PCR 07/25/2022 NEGATIVE  NEGATIVE Final   Comment: (NOTE) SARS-CoV-2 target nucleic acids are NOT DETECTED.  The SARS-CoV-2 RNA is generally detectable in upper respiratory specimens during the acute phase of infection. The lowest concentration of SARS-CoV-2 viral copies this assay can detect is 138 copies/mL. A negative result does not preclude SARS-Cov-2 infection and should not be used as the sole basis for treatment or other patient management decisions. A negative result may occur with  improper specimen collection/handling, submission of specimen other than nasopharyngeal swab, presence of viral mutation(s) within the areas targeted by this assay, and inadequate number of viral copies(<138 copies/mL). A negative result must be combined with clinical observations, patient history, and epidemiological information. The expected result is Negative.  Fact Sheet for Patients:  BloggerCourse.com  Fact Sheet for Healthcare Providers:  SeriousBroker.it  This test is no                          t yet approved or cleared by the [place] FDA and  has been authorized for detection and/or diagnosis of SARS-CoV-2 by FDA under an Emergency Use Authorization (EUA). This EUA will remain  in effect (meaning this test can be used) for the duration of the COVID-19 declaration under Section 564(b)(1) of the Act, 21 U.S.C.section 360bbb-3(b)(1), unless the authorization is terminated  or revoked sooner.       Influenza A by PCR 07/25/2022 NEGATIVE  NEGATIVE Final   Influenza B by PCR 07/25/2022 NEGATIVE  NEGATIVE Final   Comment: (NOTE) The Xpert Xpress SARS-CoV-2/FLU/RSV plus assay is intended as an  aid in the diagnosis of influenza from Nasopharyngeal swab specimens and should not be used as a sole basis for treatment. Nasal washings and aspirates are unacceptable for Xpert Xpress SARS-CoV-2/FLU/RSV testing.  Fact Sheet for Patients: BloggerCourse.com  Fact Sheet for Healthcare Providers: SeriousBroker.it  This test is not yet approved or cleared by the [place] FDA and has been authorized for detection and/or diagnosis of SARS-CoV-2 by FDA under an Emergency Use Authorization (EUA). This EUA will remain in effect (meaning this test can be used) for the duration of the COVID-19 declaration under Section 564(b)(1) of the Act, 21 U.S.C. section 360bbb-3(b)(1), unless the authorization is terminated or revoked.  Performed at [facility], [address] [place], [place]  [address]    WBC 07/25/2022 8.3  4.0 - 10.5 K/uL Final   RBC 07/25/2022 4.44  3.87 - 5.11 MIL/uL Final   Hemoglobin 07/25/2022 13.7  12.0 - 15.0 g/dL Final   HCT 16/07/9603 40.2  36.0 - 46.0 % Final   MCV 07/25/2022 90.5  80.0 - 100.0 fL Final   MCH 07/25/2022 30.9  26.0 - 34.0 pg Final   MCHC 07/25/2022 34.1  30.0 - 36.0 g/dL Final   RDW 54/06/8118 12.2  11.5 - 15.5 % Final   Platelets 07/25/2022 248  150 - 400 K/uL Final   nRBC 07/25/2022 0.0  0.0 - 0.2 % Final   Neutrophils Relative % 07/25/2022 43  % Final   Neutro Abs 07/25/2022 3.6  1.7 - 7.7 K/uL Final   Lymphocytes Relative 07/25/2022 52  % Final   Lymphs Abs 07/25/2022 4.2 (H)  0.7 - 4.0 K/uL Final   Monocytes Relative 07/25/2022 5  % Final   Monocytes Absolute 07/25/2022 0.4  0.1 - 1.0 K/uL Final   Eosinophils Relative 07/25/2022 0  % Final   Eosinophils Absolute 07/25/2022 0.0  0.0 - 0.5 K/uL Final   Basophils Relative 07/25/2022 0  % Final   Basophils Absolute 07/25/2022 0.0  0.0 - 0.1 K/uL Final   Immature Granulocytes 07/25/2022 0  % Final   Abs Immature Granulocytes 07/25/2022  0.02  0.00 - 0.07 K/uL Final   Performed at [facility], [address], [place], [place] [address]   Sodium 07/25/2022 138  135 - 145 mmol/L Final   Potassium 07/25/2022 4.0  3.5 - 5.1 mmol/L Final   Chloride 07/25/2022 104  98 - 111 mmol/L Final   CO2 07/25/2022 29  22 - 32 mmol/L Final   Glucose, Bld 07/25/2022 83  70 - 99 mg/dL Final   Glucose reference range applies only to samples taken after fasting for at least 8 hours.   BUN 07/25/2022 11  6 - 20 mg/dL Final   Creatinine, Ser 07/25/2022 0.97  0.44 - 1.00 mg/dL Final   Calcium 95/62/1308 9.2  8.9 - 10.3 mg/dL Final   Total Protein 65/78/4696 7.0  6.5 - 8.1 g/dL Final   Albumin 29/52/8413 3.8  3.5 - 5.0 g/dL Final   AST 24/40/1027 18  15 - 41 U/L Final   ALT 07/25/2022 22  0 - 44 U/L Final   Alkaline Phosphatase 07/25/2022 64  38 - 126 U/L Final   Total Bilirubin 07/25/2022 0.2 (L)  0.3 - 1.2 mg/dL Final   GFR, Estimated 07/25/2022 >60  >60 mL/min Final   Comment: (NOTE) Calculated using the CKD-EPI Creatinine Equation (2021)    Anion gap 07/25/2022 5  5 - 15 Final   Performed at [facility], [address] [place], [place] [address]   POC Amphetamine UR 07/25/2022 None Detected  NONE DETECTED (Cut Off Level 1000 ng/mL) Preliminary   POC Secobarbital (BAR) 07/25/2022 None Detected  NONE DETECTED (Cut Off Level 300 ng/mL) Preliminary   POC Buprenorphine (BUP) 07/25/2022 None Detected  NONE DETECTED (Cut Off Level 10 ng/mL) Preliminary   POC Oxazepam (BZO) 07/25/2022 None Detected  NONE DETECTED (Cut Off Level 300 ng/mL) Preliminary   POC Cocaine UR 07/25/2022 None Detected  NONE DETECTED (Cut Off Level 300 ng/mL) Preliminary   POC Methamphetamine UR 07/25/2022 None Detected  NONE DETECTED (Cut Off Level 1000 ng/mL) Preliminary   POC Morphine 07/25/2022 None Detected  NONE DETECTED (Cut Off Level 300 ng/mL) Preliminary  POC Methadone UR 07/25/2022 None Detected  NONE DETECTED (Cut Off Level 300 ng/mL) Preliminary    POC Oxycodone UR 07/25/2022 None Detected  NONE DETECTED (Cut Off Level 100 ng/mL) Preliminary   POC Marijuana UR 07/25/2022 None Detected  NONE DETECTED (Cut Off Level 50 ng/mL) Preliminary   SARSCOV2ONAVIRUS 2 AG 07/25/2022 NEGATIVE  NEGATIVE Final   Comment: (NOTE) SARS-CoV-2 antigen NOT DETECTED.   Negative results are presumptive.  Negative results do not preclude SARS-CoV-2 infection and should not be used as the sole basis for treatment or other patient management decisions, including infection  control decisions, particularly in the presence of clinical signs and  symptoms consistent with COVID-19, or in those who have been in contact with the virus.  Negative results must be combined with clinical observations, patient history, and epidemiological information. The expected result is Negative.  Fact Sheet for Patients: https://www.jennings-kim.com/  Fact Sheet for Healthcare Providers: https://alexander-rogers.biz/  This test is not yet approved or cleared by the [place] FDA and  has been authorized for detection and/or diagnosis of SARS-CoV-2 by FDA under an Emergency Use Authorization (EUA).  This EUA will remain in effect (meaning this test can be used) for the duration of  the COV                          ID-19 declaration under Section 564(b)(1) of the Act, 21 U.S.C. section 360bbb-3(b)(1), unless the authorization is terminated or revoked sooner.     Cholesterol 07/25/2022 181  0 - 200 mg/dL Final   Triglycerides 04/54/0981 118  <150 mg/dL Final   HDL 19/14/7829 45  >40 mg/dL Final   Total CHOL/HDL Ratio 07/25/2022 4.0  RATIO Final   VLDL 07/25/2022 24  0 - 40 mg/dL Final   LDL Cholesterol 07/25/2022 112 (H)  0 - 99 mg/dL Final   Comment:        Total Cholesterol/HDL:CHD Risk Coronary Heart Disease Risk Table                     Men   Women  1/2 Average Risk   3.4   3.3  Average Risk       5.0   4.4  2 X Average Risk   9.6   7.1   3 X Average Risk  23.4   11.0        Use the calculated Patient Ratio above and the CHD Risk Table to determine the patient's CHD Risk.        ATP III CLASSIFICATION (LDL):  <100     mg/dL   Optimal  562-130  mg/dL   Near or Above                    Optimal  130-159  mg/dL   Borderline  865-784  mg/dL   High  >696     mg/dL   Very High Performed at [facility], [address] [place], [place] [address]    TSH 07/25/2022 6.668 (H)  0.350 - 4.500 uIU/mL Final   Comment: Performed by a 3rd Generation assay with a functional sensitivity of <=0.01 uIU/mL. Performed at [facility], [address] [place], [place] [address]    Glucose-Capillary 07/26/2022 104 (H)  70 - 99 mg/dL Final   Glucose reference range applies only to samples taken after fasting for at least 8 hours.   T3, Free 07/31/2022 2.3  2.0 - 4.4 pg/mL Final   Comment: (NOTE) Performed At: [facility] [address] [place], [place] [id number] Jolene Schimke MD WU:[id number]    Free T4 07/31/2022 0.60 (L)  0.61 - 1.12 ng/dL Final   Comment: (NOTE) Biotin ingestion may interfere with free T4 tests. If the results are inconsistent with the TSH level, previous test results, or the clinical presentation, then consider biotin interference. If needed, order repeat testing after stopping biotin. Performed at [facility] [place] [facility], [address], [place], [place] [address]    Glucose-Capillary 08/29/2022 100 (H)  70 - 99 mg/dL Final   Glucose reference range applies only to samples taken after fasting for at least 8 hours.   TSH 09/05/2022 0.793  0.350 - 4.500 uIU/mL Final   Comment: Performed by a 3rd Generation assay with a functional sensitivity of <=0.01 uIU/mL. Performed at [facility], [address], [place], [place] [address]    Free T4 09/05/2022 0.73  0.61 - 1.12 ng/dL Final   Comment: (NOTE) Biotin ingestion may interfere with free T4 tests. If the results are inconsistent with the  TSH level, previous test results, or the clinical presentation, then consider biotin interference. If needed, order repeat testing after stopping biotin. Performed at [facility], [address], [place], [place] [address]    Preg Test, Ur 09/06/2022 Negative  Negative Final   Preg Test, Ur 09/06/2022 NEGATIVE  NEGATIVE Final   Comment:        THE SENSITIVITY OF THIS METHODOLOGY IS >24 mIU/mL    Preg Test, Ur 09/05/2022 NEGATIVE  NEGATIVE Final   Comment:        THE SENSITIVITY OF THIS METHODOLOGY IS >24 mIU/mL    Sodium 09/13/2022 136  135 - 145 mmol/L Final   Potassium 09/13/2022 4.5  3.5 - 5.1 mmol/L Final   Chloride 09/13/2022 105  98 - 111 mmol/L Final   CO2 09/13/2022 21 (L)  22 - 32 mmol/L Final   Glucose, Bld 09/13/2022 111 (H)  70 - 99 mg/dL Final   Glucose reference range applies only to samples taken after fasting for at least 8 hours.   BUN 09/13/2022 14  6 - 20 mg/dL Final   Creatinine, Ser 09/13/2022 0.92  0.44 - 1.00 mg/dL Final   Calcium 62/95/2841 9.1  8.9 - 10.3 mg/dL Final   GFR, Estimated 09/13/2022 >60  >60 mL/min Final   Comment: (NOTE) Calculated using the CKD-EPI Creatinine Equation (2021)    Anion gap 09/13/2022 10  5 - 15 Final   Performed at [facility], [address] [place], [place] [address]   Vitamin B-12 09/13/2022 585  180 - 914 pg/mL Final   Comment: (NOTE) This assay is not validated for testing neonatal or myeloproliferative syndrome specimens for Vitamin B12 levels. Performed at [facility], [address], [place], [place] [address]    Color, Urine 09/14/2022 YELLOW  YELLOW Final   APPearance 09/14/2022 HAZY (A)  CLEAR Final   Specific Gravity, Urine 09/14/2022 1.025  1.005 - 1.030 Final   pH 09/14/2022 5.0  5.0 - 8.0 Final   Glucose, UA 09/14/2022 NEGATIVE  NEGATIVE mg/dL Final   Hgb urine dipstick 09/14/2022 NEGATIVE  NEGATIVE Final   Bilirubin Urine 09/14/2022 NEGATIVE  NEGATIVE Final   Ketones, ur  09/14/2022 NEGATIVE  NEGATIVE mg/dL Final   Protein, ur 53/66/4403 NEGATIVE  NEGATIVE mg/dL Final   Nitrite 47/42/5956 NEGATIVE  NEGATIVE Final  Leukocytes,Ua 09/14/2022 TRACE (A)  NEGATIVE Final   RBC / HPF 09/14/2022 0-5  0 - 5 RBC/hpf Final   WBC, UA 09/14/2022 0-5  0 - 5 WBC/hpf Final   Bacteria, UA 09/14/2022 RARE (A)  NONE SEEN Final   Squamous Epithelial / LPF 09/14/2022 0-5  0 - 5 Final   Mucus 09/14/2022 PRESENT   Final   Performed at [facility], [address] [place], [place] [address]   Glucose-Capillary 09/16/2022 105 (H)  70 - 99 mg/dL Final   Glucose reference range applies only to samples taken after fasting for at least 8 hours.   SARS Coronavirus 2 by RT PCR 09/30/2022 NEGATIVE  NEGATIVE Final   Comment: (NOTE) SARS-CoV-2 target nucleic acids are NOT DETECTED.  The SARS-CoV-2 RNA is generally detectable in upper respiratory specimens during the acute phase of infection. The lowest concentration of SARS-CoV-2 viral copies this assay can detect is 138 copies/mL. A negative result does not preclude SARS-Cov-2 infection and should not be used as the sole basis for treatment or other patient management decisions. A negative result may occur with  improper specimen collection/handling, submission of specimen other than nasopharyngeal swab, presence of viral mutation(s) within the areas targeted by this assay, and inadequate number of viral copies(<138 copies/mL). A negative result must be combined with clinical observations, patient history, and epidemiological information. The expected result is Negative.  Fact Sheet for Patients:  BloggerCourse.com  Fact Sheet for Healthcare Providers:  SeriousBroker.it  This test is no                          t yet approved or cleared by the [place] FDA and  has been authorized for detection and/or diagnosis of SARS-CoV-2 by FDA under an Emergency Use Authorization  (EUA). This EUA will remain  in effect (meaning this test can be used) for the duration of the COVID-19 declaration under Section 564(b)(1) of the Act, 21 U.S.C.section 360bbb-3(b)(1), unless the authorization is terminated  or revoked sooner.       Influenza A by PCR 09/30/2022 NEGATIVE  NEGATIVE Final   Influenza B by PCR 09/30/2022 NEGATIVE  NEGATIVE Final   Comment: (NOTE) The Xpert Xpress SARS-CoV-2/FLU/RSV plus assay is intended as an aid in the diagnosis of influenza from Nasopharyngeal swab specimens and should not be used as a sole basis for treatment. Nasal washings and aspirates are unacceptable for Xpert Xpress SARS-CoV-2/FLU/RSV testing.  Fact Sheet for Patients: BloggerCourse.com  Fact Sheet for Healthcare Providers: SeriousBroker.it  This test is not yet approved or cleared by the [place] FDA and has been authorized for detection and/or diagnosis of SARS-CoV-2 by FDA under an Emergency Use Authorization (EUA). This EUA will remain in effect (meaning this test can be used) for the duration of the COVID-19 declaration under Section 564(b)(1) of the Act, 21 U.S.C. section 360bbb-3(b)(1), unless the authorization is terminated or revoked.     Resp Syncytial Virus by PCR 09/30/2022 NEGATIVE  NEGATIVE Final   Comment: (NOTE) Fact Sheet for Patients: BloggerCourse.com  Fact Sheet for Healthcare Providers: SeriousBroker.it  This test is not yet approved or cleared by the [place] FDA and has been authorized for detection and/or diagnosis of SARS-CoV-2 by FDA under an Emergency Use Authorization (EUA). This EUA will remain in effect (meaning this test can be used) for the duration of the COVID-19 declaration under Section 564(b)(1) of the Act, 21 U.S.C. section 360bbb-3(b)(1), unless the  authorization is terminated or revoked.  Performed at [facility], [address] [place], [place] [address]    Sodium 10/01/2022 136  135 - 145 mmol/L Final   Potassium 10/01/2022 3.8  3.5 - 5.1 mmol/L Final   Chloride 10/01/2022 104  98 - 111 mmol/L Final   CO2 10/01/2022 27  22 - 32 mmol/L Final   Glucose, Bld 10/01/2022 98  70 - 99 mg/dL Final   Glucose reference range applies only to samples taken after fasting for at least 8 hours.   BUN 10/01/2022 12  6 - 20 mg/dL Final   Creatinine, Ser 10/01/2022 0.98  0.44 - 1.00 mg/dL Final   Calcium 52/84/1324 8.9  8.9 - 10.3 mg/dL Final   GFR, Estimated 10/01/2022 >60  >60 mL/min Final   Comment: (NOTE) Calculated using the CKD-EPI Creatinine Equation (2021)    Anion gap 10/01/2022 5  5 - 15 Final   Performed at [facility], [address], [place], [place] [address]   WBC 10/01/2022 5.1  4.0 - 10.5 K/uL Final   RBC 10/01/2022 4.31  3.87 - 5.11 MIL/uL Final   Hemoglobin 10/01/2022 12.7  12.0 - 15.0 g/dL Final   HCT 72/53/6644 38.8  36.0 - 46.0 % Final   MCV 10/01/2022 90.0  80.0 - 100.0 fL Final   MCH 10/01/2022 29.5  26.0 - 34.0 pg Final   MCHC 10/01/2022 32.7  30.0 - 36.0 g/dL Final   RDW 03/47/4259 12.4  11.5 - 15.5 % Final   Platelets 10/01/2022 300  150 - 400 K/uL Final   nRBC 10/01/2022 0.0  0.0 - 0.2 % Final   Performed at [facility], [address], [place], [place] [address]   Lipase 10/01/2022 29  11 - 51 U/L Final   Performed at [facility], [address], [place], [place] [address]   Color, Urine 10/05/2022 YELLOW  YELLOW Final   APPearance 10/05/2022 HAZY (A)  CLEAR Final   Specific Gravity, Urine 10/05/2022 1.018  1.005 - 1.030 Final   pH 10/05/2022 5.0  5.0 - 8.0 Final   Glucose, UA 10/05/2022 NEGATIVE  NEGATIVE mg/dL Final   Hgb urine dipstick 10/05/2022 NEGATIVE  NEGATIVE Final   Bilirubin Urine 10/05/2022 NEGATIVE  NEGATIVE Final   Ketones, ur 10/05/2022 NEGATIVE  NEGATIVE mg/dL Final   Protein, ur 29/51/8841 NEGATIVE  NEGATIVE mg/dL Final    Nitrite 66/03/3015 NEGATIVE  NEGATIVE Final   Leukocytes,Ua 10/05/2022 TRACE (A)  NEGATIVE Final   RBC / HPF 10/05/2022 0-5  0 - 5 RBC/hpf Final   WBC, UA 10/05/2022 0-5  0 - 5 WBC/hpf Final   Bacteria, UA 10/05/2022 RARE (A)  NONE SEEN Final   Squamous Epithelial / LPF 10/05/2022 0-5  0 - 5 Final   Mucus 10/05/2022 PRESENT   Final   Performed at [facility], [address] [place], [place] [address]   Specimen Description 10/05/2022 URINE, CLEAN CATCH   Final   Special Requests 10/05/2022    Final                   Value:NONE Performed at [facility], [address], [place], [place] [address]    Culture 10/05/2022 10,000 COLONIES/mL MULTIPLE SPECIES PRESENT, SUGGEST RECOLLECTION (A)   Final   Report Status 10/05/2022 10/07/2022 FINAL   Final  Admission on 07/04/2022, Discharged on 07/05/2022  Component Date Value Ref Range Status  Sodium 07/04/2022 142  135 - 145 mmol/L Final   Potassium 07/04/2022 4.4  3.5 - 5.1 mmol/L Final   Chloride 07/04/2022 109  98 - 111 mmol/L Final   CO2 07/04/2022 26  22 - 32 mmol/L Final   Glucose, Bld 07/04/2022 96  70 - 99 mg/dL Final   Glucose reference range applies only to samples taken after fasting for at least 8 hours.   BUN 07/04/2022 15  6 - 20 mg/dL Final   Creatinine, Ser 07/04/2022 0.83  0.44 - 1.00 mg/dL Final   Calcium 40/98/1191 9.3  8.9 - 10.3 mg/dL Final   Total Protein 47/82/9562 7.2  6.5 - 8.1 g/dL Final   Albumin 13/05/6577 3.9  3.5 - 5.0 g/dL Final   AST 46/96/2952 23  15 - 41 U/L Final   ALT 07/04/2022 28  0 - 44 U/L Final   Alkaline Phosphatase 07/04/2022 77  38 - 126 U/L Final   Total Bilirubin 07/04/2022 0.4  0.3 - 1.2 mg/dL Final   GFR, Estimated 07/04/2022 >60  >60 mL/min Final   Comment: (NOTE) Calculated using the CKD-EPI Creatinine Equation (2021)    Anion gap 07/04/2022 7  5 - 15 Final   Performed at [facility], [address] [place], [place] [address]   Alcohol, Ethyl (B)  07/04/2022 <10  <10 mg/dL Final   Comment: (NOTE) Lowest detectable limit for serum alcohol is 10 mg/dL.  For medical purposes only. Performed at [facility], [address], [place], [place] [address]    WBC 07/04/2022 7.0  4.0 - 10.5 K/uL Final   RBC 07/04/2022 4.18  3.87 - 5.11 MIL/uL Final   Hemoglobin 07/04/2022 12.8  12.0 - 15.0 g/dL Final   HCT 27/25/3664 39.1  36.0 - 46.0 % Final   MCV 07/04/2022 93.5  80.0 - 100.0 fL Final   MCH 07/04/2022 30.6  26.0 - 34.0 pg Final   MCHC 07/04/2022 32.7  30.0 - 36.0 g/dL Final   RDW 40/34/7425 12.9  11.5 - 15.5 % Final   Platelets 07/04/2022 243  150 - 400 K/uL Final   nRBC 07/04/2022 0.0  0.0 - 0.2 % Final   Neutrophils Relative % 07/04/2022 43  % Final   Neutro Abs 07/04/2022 3.0  1.7 - 7.7 K/uL Final   Lymphocytes Relative 07/04/2022 50  % Final   Lymphs Abs 07/04/2022 3.5  0.7 - 4.0 K/uL Final   Monocytes Relative 07/04/2022 7  % Final   Monocytes Absolute 07/04/2022 0.5  0.1 - 1.0 K/uL Final   Eosinophils Relative 07/04/2022 0  % Final   Eosinophils Absolute 07/04/2022 0.0  0.0 - 0.5 K/uL Final   Basophils Relative 07/04/2022 0  % Final   Basophils Absolute 07/04/2022 0.0  0.0 - 0.1 K/uL Final   Immature Granulocytes 07/04/2022 0  % Final   Abs Immature Granulocytes 07/04/2022 0.01  0.00 - 0.07 K/uL Final   Performed at [facility], [address] [place], [place] [address]   I-stat hCG, quantitative 07/04/2022 <5.0  <5 mIU/mL Final   Comment 3 07/04/2022          Final   Comment:   GEST. AGE      CONC.  (mIU/mL)   <=1 WEEK        5 - 50     2 WEEKS       50 - 500     3 WEEKS  100 - 10,000     4 WEEKS     1,000 - 30,000        FEMALE AND NON-PREGNANT FEMALE:     LESS THAN 5 mIU/mL   Admission on 06/14/2022, Discharged on 06/14/2022  Component Date Value Ref Range Status   Color, UA 06/14/2022 yellow  yellow Final   Clarity, UA 06/14/2022 cloudy (A)  clear Final   Glucose, UA  06/14/2022 negative  negative mg/dL Final   Bilirubin, UA 47/65/4650 negative  negative Final   Ketones, POC UA 06/14/2022 negative  negative mg/dL Final   Spec Grav, UA 35/46/5681 1.025  1.010 - 1.025 Final   Blood, UA 06/14/2022 negative  negative Final   pH, UA 06/14/2022 7.5  5.0 - 8.0 Final   Protein Ur, POC 06/14/2022 =30 (A)  negative mg/dL Final   Urobilinogen, UA 06/14/2022 0.2  0.2 or 1.0 E.U./dL Final   Nitrite, UA 27/51/7001 Negative  Negative Final   Leukocytes, UA 06/14/2022 Negative  Negative Final   Preg Test, Ur 06/14/2022 Negative  Negative Final   Specimen Description 06/14/2022 URINE, CLEAN CATCH   Final   Special Requests 06/14/2022    Final                   Value:NONE Performed at [facility], [address], [place], [place] [address]    Culture 06/14/2022 MULTIPLE SPECIES PRESENT, SUGGEST RECOLLECTION (A)   Final   Report Status 06/14/2022 06/16/2022 FINAL   Final  Admission on 06/07/2022, Discharged on 06/10/2022  Component Date Value Ref Range Status   SARS Coronavirus 2 by RT PCR 06/07/2022 NEGATIVE  NEGATIVE Final   Comment: (NOTE) SARS-CoV-2 target nucleic acids are NOT DETECTED.  The SARS-CoV-2 RNA is generally detectable in upper respiratory specimens during the acute phase of infection. The lowest concentration of SARS-CoV-2 viral copies this assay can detect is 138 copies/mL. A negative result does not preclude SARS-Cov-2 infection and should not be used as the sole basis for treatment or other patient management decisions. A negative result may occur with  improper specimen collection/handling, submission of specimen other than nasopharyngeal swab, presence of viral mutation(s) within the areas targeted by this assay, and inadequate number of viral copies(<138 copies/mL). A negative result must be combined with clinical observations, patient history, and epidemiological information. The expected result is Negative.  Fact Sheet for  Patients:  BloggerCourse.com  Fact Sheet for Healthcare Providers:  SeriousBroker.it  This test is no                          t yet approved or cleared by the [place] FDA and  has been authorized for detection and/or diagnosis of SARS-CoV-2 by FDA under an Emergency Use Authorization (EUA). This EUA will remain  in effect (meaning this test can be used) for the duration of the COVID-19 declaration under Section 564(b)(1) of the Act, 21 U.S.C.section 360bbb-3(b)(1), unless the authorization is terminated  or revoked sooner.       Influenza A by PCR 06/07/2022 NEGATIVE  NEGATIVE Final   Influenza B by PCR 06/07/2022 NEGATIVE  NEGATIVE Final   Comment: (NOTE) The Xpert Xpress SARS-CoV-2/FLU/RSV plus assay is intended as an aid in the diagnosis of influenza from Nasopharyngeal swab specimens and should not be used as a sole basis for treatment. Nasal washings and aspirates are unacceptable for Xpert Xpress SARS-CoV-2/FLU/RSV testing.  Fact Sheet for Patients: BloggerCourse.com  Fact  Sheet for Healthcare Providers: SeriousBroker.it  This test is not yet approved or cleared by the [place] and has been authorized for detection and/or diagnosis of SARS-CoV-2 by FDA under an Emergency Use Authorization (EUA). This EUA will remain in effect (meaning this test can be used) for the duration of the COVID-19 declaration under Section 564(b)(1) of the Act, 21 U.S.C. section 360bbb-3(b)(1), unless the authorization is terminated or revoked.  Performed at [facility], [address] [place], [place] [address]    WBC 06/07/2022 5.7  4.0 - 10.5 K/uL Final   RBC 06/07/2022 4.39  3.87 - 5.11 MIL/uL Final   Hemoglobin 06/07/2022 13.2  12.0 - 15.0 g/dL Final   HCT 53/66/4403 40.4  36.0 - 46.0 % Final   MCV 06/07/2022 92.0  80.0 - 100.0 fL Final   MCH 06/07/2022 30.1   26.0 - 34.0 pg Final   MCHC 06/07/2022 32.7  30.0 - 36.0 g/dL Final   RDW 47/42/5956 12.4  11.5 - 15.5 % Final   Platelets 06/07/2022 308  150 - 400 K/uL Final   nRBC 06/07/2022 0.0  0.0 - 0.2 % Final   Neutrophils Relative % 06/07/2022 42  % Final   Neutro Abs 06/07/2022 2.4  1.7 - 7.7 K/uL Final   Lymphocytes Relative 06/07/2022 54  % Final   Lymphs Abs 06/07/2022 3.1  0.7 - 4.0 K/uL Final   Monocytes Relative 06/07/2022 4  % Final   Monocytes Absolute 06/07/2022 0.2  0.1 - 1.0 K/uL Final   Eosinophils Relative 06/07/2022 0  % Final   Eosinophils Absolute 06/07/2022 0.0  0.0 - 0.5 K/uL Final   Basophils Relative 06/07/2022 0  % Final   Basophils Absolute 06/07/2022 0.0  0.0 - 0.1 K/uL Final   Immature Granulocytes 06/07/2022 0  % Final   Abs Immature Granulocytes 06/07/2022 0.01  0.00 - 0.07 K/uL Final   Performed at [facility], [address] [place], [place] [address]   Sodium 06/07/2022 139  135 - 145 mmol/L Final   Potassium 06/07/2022 4.0  3.5 - 5.1 mmol/L Final   Chloride 06/07/2022 104  98 - 111 mmol/L Final   CO2 06/07/2022 28  22 - 32 mmol/L Final   Glucose, Bld 06/07/2022 104 (H)  70 - 99 mg/dL Final   Glucose reference range applies only to samples taken after fasting for at least 8 hours.   BUN 06/07/2022 8  6 - 20 mg/dL Final   Creatinine, Ser 06/07/2022 0.84  0.44 - 1.00 mg/dL Final   Calcium 43/32/9518 9.1  8.9 - 10.3 mg/dL Final   Total Protein 84/16/6063 6.9  6.5 - 8.1 g/dL Final   Albumin 01/60/1093 3.7  3.5 - 5.0 g/dL Final   AST 23/55/7322 19  15 - 41 U/L Final   ALT 06/07/2022 22  0 - 44 U/L Final   Alkaline Phosphatase 06/07/2022 54  38 - 126 U/L Final   Total Bilirubin 06/07/2022 0.4  0.3 - 1.2 mg/dL Final   GFR, Estimated 06/07/2022 >60  >60 mL/min Final   Comment: (NOTE) Calculated using the CKD-EPI Creatinine Equation (2021)    Anion gap 06/07/2022 7  5 - 15 Final   Performed at [facility], [address] [place], [place] [address]    Hgb A1c MFr Bld 06/07/2022 5.0  4.8 - 5.6 % Final   Comment: (NOTE) Pre diabetes:          5.7%-6.4%  Diabetes:              >  6.4%  Glycemic control for   <7.0% adults with diabetes    Mean Plasma Glucose 06/07/2022 96.8  mg/dL Final   Performed at [facility], [address] [place], [place] [address]   TSH 06/07/2022 1.620  0.350 - 4.500 uIU/mL Final   Comment: Performed by a 3rd Generation assay with a functional sensitivity of <=0.01 uIU/mL. Performed at [facility], [address] [place], [place] [address]    RPR Ser Ql 06/07/2022 NON REACTIVE  NON REACTIVE Final   Performed at [facility], [address] [place], [place] [address]   Color, Urine 06/07/2022 YELLOW  YELLOW Final   APPearance 06/07/2022 HAZY (A)  CLEAR Final   Specific Gravity, Urine 06/07/2022 1.018  1.005 - 1.030 Final   pH 06/07/2022 7.0  5.0 - 8.0 Final   Glucose, UA 06/07/2022 NEGATIVE  NEGATIVE mg/dL Final   Hgb urine dipstick 06/07/2022 NEGATIVE  NEGATIVE Final   Bilirubin Urine 06/07/2022 NEGATIVE  NEGATIVE Final   Ketones, ur 06/07/2022 NEGATIVE  NEGATIVE mg/dL Final   Protein, ur 91/47/829508/25/2023 NEGATIVE  NEGATIVE mg/dL Final   Nitrite 62/13/086508/25/2023 NEGATIVE  NEGATIVE Final   Leukocytes,Ua 06/07/2022 NEGATIVE  NEGATIVE Final   Performed at [facility], [address] [place], [place] [address]   Cholesterol 06/07/2022 164  0 - 200 mg/dL Final   Triglycerides 62/95/284108/25/2023 158 (H)  <150 mg/dL Final   HDL 32/44/010208/25/2023 44  >40 mg/dL Final   Total CHOL/HDL Ratio 06/07/2022 3.7  RATIO Final   VLDL 06/07/2022 32  0 - 40 mg/dL Final   LDL Cholesterol 06/07/2022 88  0 - 99 mg/dL Final   Comment:        Total Cholesterol/HDL:CHD Risk Coronary Heart Disease Risk Table                     Men   Women  1/2 Average Risk   3.4   3.3  Average Risk       5.0   4.4  2 X Average Risk   9.6   7.1  3 X Average Risk  23.4   11.0        Use the calculated Patient Ratio above and the CHD Risk  Table to determine the patient's CHD Risk.        ATP III CLASSIFICATION (LDL):  <100     mg/dL   Optimal  [id number]  mg/dL   Near or Above                    Optimal  130-159  mg/dL   Borderline  [phone]  mg/dL   High  >425>190     mg/dL   Very High Performed at [facility], [address] [place], [place] [address]    HIV Screen 4th Generation wRfx 06/07/2022 Non Reactive  Non Reactive Final   Performed at [facility], [address] [place], [place] [address]   SARSCOV2ONAVIRUS 2 AG 06/07/2022 NEGATIVE  NEGATIVE Final   Comment: (NOTE) SARS-CoV-2 antigen NOT DETECTED.   Negative results are presumptive.  Negative results do not preclude SARS-CoV-2 infection and should not be used as the sole basis for treatment or other patient management decisions, including infection  control decisions, particularly in the presence of clinical signs and  symptoms consistent with COVID-19, or in those who have been in contact with the virus.  Negative results must be combined  with clinical observations, patient history, and epidemiological information. The expected result is Negative.  Fact Sheet for Patients: https://www.jennings-kim.com/  Fact Sheet for Healthcare Providers: https://alexander-rogers.biz/  This test is not yet approved or cleared by the [place] FDA and  has been authorized for detection and/or diagnosis of SARS-CoV-2 by FDA under an Emergency Use Authorization (EUA).  This EUA will remain in effect (meaning this test can be used) for the duration of  the COV                          ID-19 declaration under Section 564(b)(1) of the Act, 21 U.S.C. section 360bbb-3(b)(1), unless the authorization is terminated or revoked sooner.     POC Amphetamine UR 06/07/2022 None Detected  NONE DETECTED (Cut Off Level 1000 ng/mL) Final   POC Secobarbital (BAR) 06/07/2022 None Detected  NONE DETECTED (Cut Off Level 300 ng/mL) Final   POC  Buprenorphine (BUP) 06/07/2022 None Detected  NONE DETECTED (Cut Off Level 10 ng/mL) Final   POC Oxazepam (BZO) 06/07/2022 None Detected  NONE DETECTED (Cut Off Level 300 ng/mL) Final   POC Cocaine UR 06/07/2022 None Detected  NONE DETECTED (Cut Off Level 300 ng/mL) Final   POC Methamphetamine UR 06/07/2022 None Detected  NONE DETECTED (Cut Off Level 1000 ng/mL) Final   POC Morphine 06/07/2022 None Detected  NONE DETECTED (Cut Off Level 300 ng/mL) Final   POC Methadone UR 06/07/2022 None Detected  NONE DETECTED (Cut Off Level 300 ng/mL) Final   POC Oxycodone UR 06/07/2022 None Detected  NONE DETECTED (Cut Off Level 100 ng/mL) Final   POC Marijuana UR 06/07/2022 None Detected  NONE DETECTED (Cut Off Level 50 ng/mL) Final   Preg Test, Ur 06/08/2022 NEGATIVE  NEGATIVE Final   Comment:        THE SENSITIVITY OF THIS METHODOLOGY IS >20 mIU/mL. Performed at [facility], [address], [place], [place] [address]    Preg Test, Ur 06/08/2022 NEGATIVE  NEGATIVE Final   Comment:        THE SENSITIVITY OF THIS METHODOLOGY IS >24 mIU/mL     Blood Alcohol level:  Lab Results  Component Value Date   ETH <10 07/04/2022   ETH <10 02/07/2022    Metabolic Disorder Labs: Lab Results  Component Value Date   HGBA1C 5.0 06/07/2022   MPG 96.8 06/07/2022   MPG 96.8 02/07/2022   No results found for: "PROLACTIN" Lab Results  Component Value Date   CHOL 181 07/25/2022   TRIG 118 07/25/2022   HDL 45 07/25/2022   CHOLHDL 4.0 07/25/2022   VLDL 24 07/25/2022   LDLCALC 112 (H) 07/25/2022   LDLCALC 88 06/07/2022    Therapeutic Lab Levels: No results found for: "LITHIUM" No results found for: "VALPROATE" No results found for: "CBMZ"  Physical Findings   GAD-7    Flowsheet Row [facility] Visit [facility] 12/17/2021 in [facility]  Total GAD-7 Score 17      PHQ2-9    Flowsheet Row ED from 06/07/2022 in [facility] Office Visit from  12/17/2021 in [facility]  PHQ-2 Total Score 1 2  PHQ-9 Total Score 2 8      Flowsheet Row ED from 10/07/2022 in [facility] Most recent reading at 10/07/2022  8:16 PM ED from 10/07/2022 in [facility] EMERGENCY DEPARTMENT Most recent reading at 10/07/2022 12:18 PM ED  from 07/25/2022 in [facility] Most recent reading at 10/06/2022  9:38 AM  C-SSRS RISK CATEGORY No Risk No Risk No Risk       Psychiatric Specialty Exam  Presentation  General Appearance:  Appropriate for Environment  Eye Contact: Good  Speech: Clear and Coherent  Speech Volume: Normal  Handedness: Right   Mood and Affect  Mood: Euthymic  Affect: Appropriate   Thought Process  Thought Processes: Coherent  Descriptions of Associations:Intact  Orientation:Full (Time, Place and Person)  Thought Content:WDL  Diagnosis of Schizophrenia or Schizoaffective disorder in past: Yes    Hallucinations:Hallucinations: None  Ideas of Reference:None  Suicidal Thoughts:Suicidal Thoughts: No  Homicidal Thoughts:Homicidal Thoughts: No   Sensorium  Memory: Immediate Good  Judgment: Intact  Insight: Present   Executive Functions  Concentration: Good  Attention Span: Good  Recall: Good  Fund of Knowledge: Fair  Language: Good   Psychomotor Activity  Psychomotor Activity: Psychomotor Activity: Normal   Assets  Assets: Communication Skills; Desire for Improvement; Social Support; Resilience   Sleep  Sleep: Sleep: Good   No data recorded  Physical Exam  Physical Exam Constitutional:      Appearance: Normal appearance.  HENT:     Head: Normocephalic.  Eyes:     Extraocular Movements: Extraocular movements intact.     Pupils: Pupils are equal, round, and reactive to light.  Cardiovascular:     Rate and Rhythm: Normal rate.  Pulmonary:     Effort: Pulmonary effort  is normal.  Neurological:     General: No focal deficit present.     Mental Status: She is alert.    Review of Systems  Psychiatric/Behavioral: Negative.     Blood pressure 103/68, pulse (!) 101, temperature 98.3 F (36.8 C), temperature source Oral, resp. rate 16, height  (1.549 m), weight 198 lb (89.8 kg), SpO2 99 %. Body mass index is 37.41 kg/m.  Treatment Plan Summary: Continue collaborative efforts to secure appropriate residential facility placement for patient with [facility]/[employer]. Patient remains psych cleared.   Joaquin Courts, FNP-C, PMHNP-BC 10/12/2022 11:30 AM

## 2022-10-13 ENCOUNTER — Encounter (HOSPITAL_COMMUNITY): Payer: Self-pay | Admitting: Student

## 2022-10-13 MED ORDER — POLYETHYLENE GLYCOL 3350 17 G PO PACK
17.0000 g | PACK | Freq: Once | ORAL | Status: AC
  Administered 2022-10-13: 17 g via ORAL
  Filled 2022-10-13: qty 1

## 2022-10-13 MED ORDER — CETAPHIL MOISTURIZING EX LOTN: TOPICAL_LOTION | CUTANEOUS | Status: DC | PRN

## 2022-10-13 NOTE — ED Provider Notes (Addendum)
Behavioral Health Progress Note  Date and Time: 10/13/2022 10:26 AM Name: Nicole [name] MRN:  [id number]   Diagnosis:  Final diagnoses:  Tobacco use disorder  Schizophrenia, unspecified type ([place])  MDD (major depressive disorder), recurrent episode, moderate ([facility])  Hypothyroidism, unspecified type    Total Time spent with patient: 20 minutes  Brief HPI: Nicole [name] 31 y.o., female patient with schizophrenia, MDD with psychosis, presented to [facility], voluntarily, as a walk-in accompanied by [employer] after a disagreement and verbal altercation with personnel at the group home.  She has been dismissed previously from other group homes due to n which she was residing in.  Patient was admitted to continuous assessment unit on 07/25/2022 as she also endorsed passive SI at that time along with symptoms of visual hallucination. Patient was subsequently psychiatrically cleared during her admission to the continuous assessment unit.  However patient remains here at [facility] until adequate- residential placement can be obtained.    Legal Guardian: Nicole [name]) transitioning to be Nicole [name]) Point of contact: Nicole [name])  Tests administered: Wechsler Adult Intelligence Scale-4 (see Nicole Harman, PhD consult note on 08/19/2022 for full info) [place]-3 Adaptive Behavior Scales Assessment completed on 09/13/2022 administered by Rosezetta Schlatter, MD  Subjective: During evaluation Nicole [name] was initially seen asleep, awoken easily, no acute distress.  On evaluation, patient was pleasant, cooperative, engaged with evaluation.  She denied side effects of medication.  Reported that her sleep and appetite have been stable and appropriate.  Reported her mood as good. Reported having mild abdominal pain and constipation, with last bowel movement about 1 week ago. She was amenable to stool softener daily. She denied SI/HI/AVH, paranoia, contracted to safety.  Patient had no other  questions or concerns and was amenable to plan per below.   Past Medical History:  Past Medical History:  Diagnosis Date   Anxiety    Depression    Hypothyroidism 08/07/2022   Tobacco use disorder 08/07/2022    Past Surgical History:  Procedure Laterality Date   WISDOM TOOTH EXTRACTION Bilateral 2020   Family History:  Family History  Problem Relation Age of Onset   Hypertension Father    Diabetes Father     Social History:  Social History   Substance and Sexual Activity  Alcohol Use Never     Social History   Substance and Sexual Activity  Drug Use Never    Social History   Socioeconomic History   Marital status: Single    Spouse name: Not on file   Number of children: Not on file   Years of education: Not on file   Highest education level: Not on file  Occupational History   Not on file  Tobacco Use   Smoking status: Every Day    Types: Cigarettes   Smokeless tobacco: Never  Vaping Use   Vaping Use: Some days  Substance and Sexual Activity   Alcohol use: Never   Drug use: Never   Sexual activity: Yes    Partners: Female    Birth control/protection: Implant  Other Topics Concern   Not on file  Social History Narrative   Not on file   Social Determinants of Health   Financial Resource Strain: Not on file  Food Insecurity: Not on file  Transportation Needs: Not on file  Physical Activity: Not on file  Stress: Not on file  Social Connections: Not on file   SDOH:  SDOH Screenings   Depression (PHQ2-9): Low Risk  (  06/10/2022)  Tobacco Use: High Risk (10/13/2022)   Additional Social History:            Current Medications:  Current Facility-Administered Medications  Medication Dose Route Frequency Provider Last Rate Last Admin   acetaminophen (TYLENOL) tablet 650 mg  650 mg Oral Q6H PRN Haynes Kerns, NP       alum & mag hydroxide-simeth (MAALOX/MYLANTA) 200-200-20 MG/5ML suspension 30 mL  30 mL Oral Q4H PRN Maple Hudson, Veronique M,  NP   30 mL at 10/10/22 1659   docusate sodium (COLACE) capsule 100 mg  100 mg Oral Daily Carrion-Carrero, Caryl Ada, MD   100 mg at 10/13/22 0946   hydrOXYzine (ATARAX) tablet 25 mg  25 mg Oral TID PRN Christene Slates, MD   25 mg at 10/12/22 2140   levothyroxine (SYNTHROID) tablet 100 mcg  100 mcg Oral Once Byungura, Veronique M, NP       levothyroxine (SYNTHROID) tablet 100 mcg  100 mcg Oral Q0600 Haynes Kerns, NP   100 mcg at 10/13/22 0622   loratadine (CLARITIN) tablet 10 mg  10 mg Oral Daily Carrion-Carrero, Caryl Ada, MD   10 mg at 10/13/22 0946   magnesium hydroxide (MILK OF MAGNESIA) suspension 30 mL  30 mL Oral Daily PRN Haynes Kerns, NP   30 mL at 10/12/22 2145   metFORMIN (GLUCOPHAGE) tablet 500 mg  500 mg Oral Q breakfast Carrion-Carrero, Caryl Ada, MD   500 mg at 10/13/22 0946   nicotine (NICODERM CQ - dosed in mg/24 hours) patch 14 mg  14 mg Transdermal Daily PRN Carrion-Carrero, Margely, MD       nitrofurantoin (macrocrystal-monohydrate) (MACROBID) capsule 100 mg  100 mg Oral Q12H Maple Hudson, Veronique M, NP   100 mg at 10/13/22 0946   ondansetron (ZOFRAN-ODT) disintegrating tablet 4 mg  4 mg Oral Q8H PRN Carrion-Carrero, Caryl Ada, MD   4 mg at 10/10/22 1312   Oxcarbazepine (TRILEPTAL) tablet 300 mg  300 mg Oral BID Maple Hudson, Veronique M, NP   300 mg at 10/13/22 0946   pantoprazole (PROTONIX) EC tablet 40 mg  40 mg Oral Daily Carrion-Carrero, Margely, MD   40 mg at 10/13/22 0946   polyethylene glycol (MIRALAX / GLYCOLAX) packet 17 g  17 g Oral Once Merrily Brittle, DO       QUEtiapine (SEROQUEL) tablet 400 mg  400 mg Oral BID Maple Hudson, Veronique M, NP   400 mg at 10/13/22 0946   sertraline (ZOLOFT) tablet 150 mg  150 mg Oral Daily Carrion-Carrero, Caryl Ada, MD   150 mg at 10/13/22 0946   traZODone (DESYREL) tablet 100 mg  100 mg Oral QHS Ronelle Nigh M, NP   100 mg at 10/12/22 2139   traZODone (DESYREL) tablet 50 mg  50 mg Oral QHS PRN Haynes Kerns, NP    50 mg at 10/12/22 2140   valACYclovir (VALTREX) tablet 500 mg  500 mg Oral Daily Carrion-Carrero, Caryl Ada, MD   500 mg at 10/13/22 0946   Current Outpatient Medications  Medication Sig Dispense Refill   ABILIFY MAINTENA 400 MG PRSY prefilled syringe 400 mg every 28 (twenty-eight) days.     cetirizine (ZYRTEC) 10 MG tablet Take 10 mg by mouth daily.     cyclobenzaprine (FLEXERIL) 10 MG tablet Take 1 tablet (10 mg total) by mouth 2 (two) times daily as needed for muscle spasms. 20 tablet 0   fluticasone (FLONASE) 50 MCG/ACT nasal spray Place 1 spray into both nostrils daily.     meloxicam (MOBIC) 15 MG  tablet Take 15 mg by mouth daily.     nitrofurantoin, macrocrystal-monohydrate, (MACROBID) 100 MG capsule Take 1 capsule (100 mg total) by mouth 2 (two) times daily. 10 capsule 0   ondansetron (ZOFRAN-ODT) 4 MG disintegrating tablet Take 1 tablet (4 mg total) by mouth every 8 (eight) hours as needed for nausea or vomiting. 20 tablet 0   Oxcarbazepine (TRILEPTAL) 300 MG tablet Take 1 tablet (300 mg total) by mouth 2 (two) times daily. 60 tablet 0   QUEtiapine (SEROQUEL) 400 MG tablet Take 1 tablet (400 mg total) by mouth 2 (two) times daily. 60 tablet 0   sertraline (ZOLOFT) 50 MG tablet Take 3 tablets (150 mg total) by mouth in the morning. 90 tablet 0   traZODone (DESYREL) 100 MG tablet Take 1 tablet (100 mg total) by mouth at bedtime. 30 tablet 0   valACYclovir (VALTREX) 500 MG tablet Take 500 mg by mouth daily.      Labs  Lab Results:  Admission on 10/07/2022, Discharged on 10/07/2022  Component Date Value Ref Range Status   Color, Urine 10/07/2022 YELLOW  YELLOW Final   APPearance 10/07/2022 HAZY (A)  CLEAR Final   Specific Gravity, Urine 10/07/2022 1.011  1.005 - 1.030 Final   pH 10/07/2022 7.0  5.0 - 8.0 Final   Glucose, UA 10/07/2022 NEGATIVE  NEGATIVE mg/dL Final   Hgb urine dipstick 10/07/2022 SMALL (A)  NEGATIVE Final   Bilirubin Urine 10/07/2022 NEGATIVE  NEGATIVE Final    Ketones, ur 10/07/2022 NEGATIVE  NEGATIVE mg/dL Final   Protein, ur 10/07/2022 NEGATIVE  NEGATIVE mg/dL Final   Nitrite 10/07/2022 NEGATIVE  NEGATIVE Final   Leukocytes,Ua 10/07/2022 LARGE (A)  NEGATIVE Final   RBC / HPF 10/07/2022 0-5  0 - 5 RBC/hpf Final   WBC, UA 10/07/2022 0-5  0 - 5 WBC/hpf Final   Bacteria, UA 10/07/2022 FEW (A)  NONE SEEN Final   Squamous Epithelial / LPF 10/07/2022 11-20  0 - 5 Final   Performed at [facility], [place] [address] [place], [place] [address]   I-stat hCG, quantitative 10/07/2022 <5.0  <5 mIU/mL Final   Comment 3 10/07/2022          Final   Comment:   GEST. AGE      CONC.  (mIU/mL)   <=1 WEEK        5 - 50     2 WEEKS       50 - 500     3 WEEKS       100 - 10,000     4 WEEKS     1,000 - 30,000        FEMALE AND NON-PREGNANT FEMALE:     LESS THAN 5 mIU/mL    Sodium 10/07/2022 138  135 - 145 mmol/L Final   Potassium 10/07/2022 4.3  3.5 - 5.1 mmol/L Final   Chloride 10/07/2022 104  98 - 111 mmol/L Final   CO2 10/07/2022 28  22 - 32 mmol/L Final   Glucose, Bld 10/07/2022 90  70 - 99 mg/dL Final   Glucose reference range applies only to samples taken after fasting for at least 8 hours.   BUN 10/07/2022 8  6 - 20 mg/dL Final   Creatinine, Ser 10/07/2022 0.96  0.44 - 1.00 mg/dL Final   Calcium 10/07/2022 9.1  8.9 - 10.3 mg/dL Final   GFR, Estimated 10/07/2022 >60  >60 mL/min Final   Comment: (NOTE) Calculated using the CKD-EPI Creatinine Equation (2021)  Anion gap 10/07/2022 6  5 - 15 Final   Performed at [facility], [address], [place], [place] [address]   WBC 10/07/2022 5.8  4.0 - 10.5 K/uL Final   RBC 10/07/2022 4.36  3.87 - 5.11 MIL/uL Final   Hemoglobin 10/07/2022 12.9  12.0 - 15.0 g/dL Final   HCT 48/10/6551 40.0  36.0 - 46.0 % Final   MCV 10/07/2022 91.7  80.0 - 100.0 fL Final   MCH 10/07/2022 29.6  26.0 - 34.0 pg Final   MCHC 10/07/2022 32.3  30.0 - 36.0 g/dL Final   RDW 74/82/7078 12.4  11.5 - 15.5 % Final   Platelets  10/07/2022 295  150 - 400 K/uL Final   nRBC 10/07/2022 0.0  0.0 - 0.2 % Final   Performed at [facility], [address], [place], [place] [address]   SARS Coronavirus 2 by RT PCR 10/07/2022 NEGATIVE  NEGATIVE Final   Comment: (NOTE) SARS-CoV-2 target nucleic acids are NOT DETECTED.  The SARS-CoV-2 RNA is generally detectable in upper respiratory specimens during the acute phase of infection. The lowest concentration of SARS-CoV-2 viral copies this assay can detect is 138 copies/mL. A negative result does not preclude SARS-Cov-2 infection and should not be used as the sole basis for treatment or other patient management decisions. A negative result may occur with  improper specimen collection/handling, submission of specimen other than nasopharyngeal swab, presence of viral mutation(s) within the areas targeted by this assay, and inadequate number of viral copies(<138 copies/mL). A negative result must be combined with clinical observations, patient history, and epidemiological information. The expected result is Negative.  Fact Sheet for Patients:  BloggerCourse.com  Fact Sheet for Healthcare Providers:  SeriousBroker.it  This test is no                          t yet approved or cleared by the [place] FDA and  has been authorized for detection and/or diagnosis of SARS-CoV-2 by FDA under an Emergency Use Authorization (EUA). This EUA will remain  in effect (meaning this test can be used) for the duration of the COVID-19 declaration under Section 564(b)(1) of the Act, 21 U.S.C.section 360bbb-3(b)(1), unless the authorization is terminated  or revoked sooner.       Influenza A by PCR 10/07/2022 NEGATIVE  NEGATIVE Final   Influenza B by PCR 10/07/2022 NEGATIVE  NEGATIVE Final   Comment: (NOTE) The Xpert Xpress SARS-CoV-2/FLU/RSV plus assay is intended as an aid in the diagnosis of influenza from Nasopharyngeal swab  specimens and should not be used as a sole basis for treatment. Nasal washings and aspirates are unacceptable for Xpert Xpress SARS-CoV-2/FLU/RSV testing.  Fact Sheet for Patients: BloggerCourse.com  Fact Sheet for Healthcare Providers: SeriousBroker.it  This test is not yet approved or cleared by the [place] FDA and has been authorized for detection and/or diagnosis of SARS-CoV-2 by FDA under an Emergency Use Authorization (EUA). This EUA will remain in effect (meaning this test can be used) for the duration of the COVID-19 declaration under Section 564(b)(1) of the Act, 21 U.S.C. section 360bbb-3(b)(1), unless the authorization is terminated or revoked.     Resp Syncytial Virus by PCR 10/07/2022 NEGATIVE  NEGATIVE Final   Comment: (NOTE) Fact Sheet for Patients: BloggerCourse.com  Fact Sheet for Healthcare Providers: SeriousBroker.it  This test is not yet approved or cleared by the [place] FDA and has been authorized for detection and/or  diagnosis of SARS-CoV-2 by FDA under an Emergency Use Authorization (EUA). This EUA will remain in effect (meaning this test can be used) for the duration of the COVID-19 declaration under Section 564(b)(1) of the Act, 21 U.S.C. section 360bbb-3(b)(1), unless the authorization is terminated or revoked.  Performed at [facility], [place] [address], [place], [place] [address]    Specimen Description 10/07/2022 URINE, CLEAN CATCH   Final   Special Requests 10/07/2022    Final                   Value:NONE Performed at [facility], [place] [address] [place], [place] [address]    Culture 10/07/2022 MULTIPLE SPECIES PRESENT, SUGGEST RECOLLECTION (A)   Final   Report Status 10/07/2022 10/08/2022 FINAL   Final  Admission on 07/25/2022, Discharged on 10/07/2022  Component Date Value Ref Range Status   SARS Coronavirus 2 by RT  PCR 07/25/2022 NEGATIVE  NEGATIVE Final   Comment: (NOTE) SARS-CoV-2 target nucleic acids are NOT DETECTED.  The SARS-CoV-2 RNA is generally detectable in upper respiratory specimens during the acute phase of infection. The lowest concentration of SARS-CoV-2 viral copies this assay can detect is 138 copies/mL. A negative result does not preclude SARS-Cov-2 infection and should not be used as the sole basis for treatment or other patient management decisions. A negative result may occur with  improper specimen collection/handling, submission of specimen other than nasopharyngeal swab, presence of viral mutation(s) within the areas targeted by this assay, and inadequate number of viral copies(<138 copies/mL). A negative result must be combined with clinical observations, patient history, and epidemiological information. The expected result is Negative.  Fact Sheet for Patients:  EntrepreneurPulse.com.au  Fact Sheet for Healthcare Providers:  IncredibleEmployment.be  This test is no                          t yet approved or cleared by the [place] FDA and  has been authorized for detection and/or diagnosis of SARS-CoV-2 by FDA under an Emergency Use Authorization (EUA). This EUA will remain  in effect (meaning this test can be used) for the duration of the COVID-19 declaration under Section 564(b)(1) of the Act, 21 U.S.C.section 360bbb-3(b)(1), unless the authorization is terminated  or revoked sooner.       Influenza A by PCR 07/25/2022 NEGATIVE  NEGATIVE Final   Influenza B by PCR 07/25/2022 NEGATIVE  NEGATIVE Final   Comment: (NOTE) The Xpert Xpress SARS-CoV-2/FLU/RSV plus assay is intended as an aid in the diagnosis of influenza from Nasopharyngeal swab specimens and should not be used as a sole basis for treatment. Nasal washings and aspirates are unacceptable for Xpert Xpress SARS-CoV-2/FLU/RSV testing.  Fact Sheet for  Patients: EntrepreneurPulse.com.au  Fact Sheet for Healthcare Providers: IncredibleEmployment.be  This test is not yet approved or cleared by the [place] FDA and has been authorized for detection and/or diagnosis of SARS-CoV-2 by FDA under an Emergency Use Authorization (EUA). This EUA will remain in effect (meaning this test can be used) for the duration of the COVID-19 declaration under Section 564(b)(1) of the Act, 21 U.S.C. section 360bbb-3(b)(1), unless the authorization is terminated or revoked.  Performed at [facility], [place] [address], [place], [place] [address]    WBC 07/25/2022 8.3  4.0 - 10.5 K/uL Final   RBC 07/25/2022 4.44  3.87 - 5.11 MIL/uL Final   Hemoglobin 07/25/2022 13.7  12.0 - 15.0 g/dL Final  HCT 07/25/2022 40.2  36.0 - 46.0 % Final   MCV 07/25/2022 90.5  80.0 - 100.0 fL Final   MCH 07/25/2022 30.9  26.0 - 34.0 pg Final   MCHC 07/25/2022 34.1  30.0 - 36.0 g/dL Final   RDW 07/25/2022 12.2  11.5 - 15.5 % Final   Platelets 07/25/2022 248  150 - 400 K/uL Final   nRBC 07/25/2022 0.0  0.0 - 0.2 % Final   Neutrophils Relative % 07/25/2022 43  % Final   Neutro Abs 07/25/2022 3.6  1.7 - 7.7 K/uL Final   Lymphocytes Relative 07/25/2022 52  % Final   Lymphs Abs 07/25/2022 4.2 (H)  0.7 - 4.0 K/uL Final   Monocytes Relative 07/25/2022 5  % Final   Monocytes Absolute 07/25/2022 0.4  0.1 - 1.0 K/uL Final   Eosinophils Relative 07/25/2022 0  % Final   Eosinophils Absolute 07/25/2022 0.0  0.0 - 0.5 K/uL Final   Basophils Relative 07/25/2022 0  % Final   Basophils Absolute 07/25/2022 0.0  0.0 - 0.1 K/uL Final   Immature Granulocytes 07/25/2022 0  % Final   Abs Immature Granulocytes 07/25/2022 0.02  0.00 - 0.07 K/uL Final   Performed at [facility], [place] [address] [place], [place] [address]   Sodium 07/25/2022 138  135 - 145 mmol/L Final   Potassium 07/25/2022 4.0  3.5 - 5.1 mmol/L Final   Chloride 07/25/2022 104   98 - 111 mmol/L Final   CO2 07/25/2022 29  22 - 32 mmol/L Final   Glucose, Bld 07/25/2022 83  70 - 99 mg/dL Final   Glucose reference range applies only to samples taken after fasting for at least 8 hours.   BUN 07/25/2022 11  6 - 20 mg/dL Final   Creatinine, Ser 07/25/2022 0.97  0.44 - 1.00 mg/dL Final   Calcium 07/25/2022 9.2  8.9 - 10.3 mg/dL Final   Total Protein 07/25/2022 7.0  6.5 - 8.1 g/dL Final   Albumin 07/25/2022 3.8  3.5 - 5.0 g/dL Final   AST 07/25/2022 18  15 - 41 U/L Final   ALT 07/25/2022 22  0 - 44 U/L Final   Alkaline Phosphatase 07/25/2022 64  38 - 126 U/L Final   Total Bilirubin 07/25/2022 0.2 (L)  0.3 - 1.2 mg/dL Final   GFR, Estimated 07/25/2022 >60  >60 mL/min Final   Comment: (NOTE) Calculated using the CKD-EPI Creatinine Equation (2021)    Anion gap 07/25/2022 5  5 - 15 Final   Performed at [facility] [place] [address] [place], [place] [address]   POC Amphetamine UR 07/25/2022 None Detected  NONE DETECTED (Cut Off Level 1000 ng/mL) Preliminary   POC Secobarbital (BAR) 07/25/2022 None Detected  NONE DETECTED (Cut Off Level 300 ng/mL) Preliminary   POC Buprenorphine (BUP) 07/25/2022 None Detected  NONE DETECTED (Cut Off Level 10 ng/mL) Preliminary   POC Oxazepam (BZO) 07/25/2022 None Detected  NONE DETECTED (Cut Off Level 300 ng/mL) Preliminary   POC Cocaine UR 07/25/2022 None Detected  NONE DETECTED (Cut Off Level 300 ng/mL) Preliminary   POC Methamphetamine UR 07/25/2022 None Detected  NONE DETECTED (Cut Off Level 1000 ng/mL) Preliminary   POC Morphine 07/25/2022 None Detected  NONE DETECTED (Cut Off Level 300 ng/mL) Preliminary   POC Methadone UR 07/25/2022 None Detected  NONE DETECTED (Cut Off Level 300 ng/mL) Preliminary   POC Oxycodone UR 07/25/2022 None Detected  NONE DETECTED (Cut Off Level 100 ng/mL) Preliminary   POC  Marijuana UR 07/25/2022 None Detected  NONE DETECTED (Cut Off Level 50 ng/mL) Preliminary   SARSCOV2ONAVIRUS 2 AG 07/25/2022 NEGATIVE   NEGATIVE Final   Comment: (NOTE) SARS-CoV-2 antigen NOT DETECTED.   Negative results are presumptive.  Negative results do not preclude SARS-CoV-2 infection and should not be used as the sole basis for treatment or other patient management decisions, including infection  control decisions, particularly in the presence of clinical signs and  symptoms consistent with COVID-19, or in those who have been in contact with the virus.  Negative results must be combined with clinical observations, patient history, and epidemiological information. The expected result is Negative.  Fact Sheet for Patients: HandmadeRecipes.com.cy  Fact Sheet for Healthcare Providers: FuneralLife.at  This test is not yet approved or cleared by the [place] FDA and  has been authorized for detection and/or diagnosis of SARS-CoV-2 by FDA under an Emergency Use Authorization (EUA).  This EUA will remain in effect (meaning this test can be used) for the duration of  the COV                          ID-19 declaration under Section 564(b)(1) of the Act, 21 U.S.C. section 360bbb-3(b)(1), unless the authorization is terminated or revoked sooner.     Cholesterol 07/25/2022 181  0 - 200 mg/dL Final   Triglycerides 07/25/2022 118  <150 mg/dL Final   HDL 07/25/2022 45  >40 mg/dL Final   Total CHOL/HDL Ratio 07/25/2022 4.0  RATIO Final   VLDL 07/25/2022 24  0 - 40 mg/dL Final   LDL Cholesterol 07/25/2022 112 (H)  0 - 99 mg/dL Final   Comment:        Total Cholesterol/HDL:CHD Risk Coronary Heart Disease Risk Table                     Men   Women  1/2 Average Risk   3.4   3.3  Average Risk       5.0   4.4  2 X Average Risk   9.6   7.1  3 X Average Risk  23.4   11.0        Use the calculated Patient Ratio above and the CHD Risk Table to determine the patient's CHD Risk.        ATP III CLASSIFICATION (LDL):  <100     mg/dL   Optimal  100-129  mg/dL   Near or  Above                    Optimal  130-159  mg/dL   Borderline  160-189  mg/dL   High  >190     mg/dL   Very High Performed at [facility] [address] [place], [place] [address]    TSH 07/25/2022 6.668 (H)  0.350 - 4.500 uIU/mL Final   Comment: Performed by a 3rd Generation assay with a functional sensitivity of <=0.01 uIU/mL. Performed at [facility], [place] [address], [place],  [address]    Glucose-Capillary 07/26/2022 104 (H)  70 - 99 mg/dL Final   Glucose reference range applies only to samples taken after fasting for at least 8 hours.   T3, Free 07/31/2022 2.3  2.0 - 4.4 pg/mL Final   Comment: (NOTE) Performed At: [facility] [place], [place] [id number] Rush Farmer MD [id number]    Free T4 07/31/2022 0.60 (L)  0.61 -  1.12 ng/dL Final   Comment: (NOTE) Biotin ingestion may interfere with free T4 tests. If the results are inconsistent with the TSH level, previous test results, or the clinical presentation, then consider biotin interference. If needed, order repeat testing after stopping biotin. Performed at [facility], [place] [address] [place], [place] [address]    Glucose-Capillary 08/29/2022 100 (H)  70 - 99 mg/dL Final   Glucose reference range applies only to samples taken after fasting for at least 8 hours.   TSH 09/05/2022 0.793  0.350 - 4.500 uIU/mL Final   Comment: Performed by a 3rd Generation assay with a functional sensitivity of <=0.01 uIU/mL. Performed at [facility], [place] [address], [place], [place] [address]    Free T4 09/05/2022 0.73  0.61 - 1.12 ng/dL Final   Comment: (NOTE) Biotin ingestion may interfere with free T4 tests. If the results are inconsistent with the TSH level, previous test results, or the clinical presentation, then consider biotin interference. If needed, order repeat testing after stopping biotin. Performed at [facility], [place] [address] [place],  [place] [address]    Preg Test, Ur 09/06/2022 Negative  Negative Final   Preg Test, Ur 09/06/2022 NEGATIVE  NEGATIVE Final   Comment:        THE SENSITIVITY OF THIS METHODOLOGY IS >24 mIU/mL    Preg Test, Ur 09/05/2022 NEGATIVE  NEGATIVE Final   Comment:        THE SENSITIVITY OF THIS METHODOLOGY IS >24 mIU/mL    Sodium 09/13/2022 136  135 - 145 mmol/L Final   Potassium 09/13/2022 4.5  3.5 - 5.1 mmol/L Final   Chloride 09/13/2022 105  98 - 111 mmol/L Final   CO2 09/13/2022 21 (L)  22 - 32 mmol/L Final   Glucose, Bld 09/13/2022 111 (H)  70 - 99 mg/dL Final   Glucose reference range applies only to samples taken after fasting for at least 8 hours.   BUN 09/13/2022 14  6 - 20 mg/dL Final   Creatinine, Ser 09/13/2022 0.92  0.44 - 1.00 mg/dL Final   Calcium 09/13/2022 9.1  8.9 - 10.3 mg/dL Final   GFR, Estimated 09/13/2022 >60  >60 mL/min Final   Comment: (NOTE) Calculated using the CKD-EPI Creatinine Equation (2021)    Anion gap 09/13/2022 10  5 - 15 Final   Performed at [facility], [place] [address], [place], [place] [address]   Vitamin B-12 09/13/2022 585  180 - 914 pg/mL Final   Comment: (NOTE) This assay is not validated for testing neonatal or myeloproliferative syndrome specimens for Vitamin B12 levels. Performed at [facility], [place] [address], [place], [place] [address]    Color, Urine 09/14/2022 YELLOW  YELLOW Final   APPearance 09/14/2022 HAZY (A)  CLEAR Final   Specific Gravity, Urine 09/14/2022 1.025  1.005 - 1.030 Final   pH 09/14/2022 5.0  5.0 - 8.0 Final   Glucose, UA 09/14/2022 NEGATIVE  NEGATIVE mg/dL Final   Hgb urine dipstick 09/14/2022 NEGATIVE  NEGATIVE Final   Bilirubin Urine 09/14/2022 NEGATIVE  NEGATIVE Final   Ketones, ur 09/14/2022 NEGATIVE  NEGATIVE mg/dL Final   Protein, ur 09/14/2022 NEGATIVE  NEGATIVE mg/dL Final   Nitrite 09/14/2022 NEGATIVE  NEGATIVE Final   Leukocytes,Ua 09/14/2022 TRACE (A)  NEGATIVE Final   RBC / HPF 09/14/2022 0-5  0 -  5 RBC/hpf Final   WBC, UA 09/14/2022 0-5  0 - 5 WBC/hpf Final   Bacteria,  UA 09/14/2022 RARE (A)  NONE SEEN Final   Squamous Epithelial / LPF 09/14/2022 0-5  0 - 5 Final   Mucus 09/14/2022 PRESENT   Final   Performed at [facility], [place] [address], [place], [place] [address]   Glucose-Capillary 09/16/2022 105 (H)  70 - 99 mg/dL Final   Glucose reference range applies only to samples taken after fasting for at least 8 hours.   SARS Coronavirus 2 by RT PCR 09/30/2022 NEGATIVE  NEGATIVE Final   Comment: (NOTE) SARS-CoV-2 target nucleic acids are NOT DETECTED.  The SARS-CoV-2 RNA is generally detectable in upper respiratory specimens during the acute phase of infection. The lowest concentration of SARS-CoV-2 viral copies this assay can detect is 138 copies/mL. A negative result does not preclude SARS-Cov-2 infection and should not be used as the sole basis for treatment or other patient management decisions. A negative result may occur with  improper specimen collection/handling, submission of specimen other than nasopharyngeal swab, presence of viral mutation(s) within the areas targeted by this assay, and inadequate number of viral copies(<138 copies/mL). A negative result must be combined with clinical observations, patient history, and epidemiological information. The expected result is Negative.  Fact Sheet for Patients:  EntrepreneurPulse.com.au  Fact Sheet for Healthcare Providers:  IncredibleEmployment.be  This test is no                          t yet approved or cleared by the [place] FDA and  has been authorized for detection and/or diagnosis of SARS-CoV-2 by FDA under an Emergency Use Authorization (EUA). This EUA will remain  in effect (meaning this test can be used) for the duration of the COVID-19 declaration under Section 564(b)(1) of the Act, 21 U.S.C.section 360bbb-3(b)(1), unless the authorization is terminated  or  revoked sooner.       Influenza A by PCR 09/30/2022 NEGATIVE  NEGATIVE Final   Influenza B by PCR 09/30/2022 NEGATIVE  NEGATIVE Final   Comment: (NOTE) The Xpert Xpress SARS-CoV-2/FLU/RSV plus assay is intended as an aid in the diagnosis of influenza from Nasopharyngeal swab specimens and should not be used as a sole basis for treatment. Nasal washings and aspirates are unacceptable for Xpert Xpress SARS-CoV-2/FLU/RSV testing.  Fact Sheet for Patients: EntrepreneurPulse.com.au  Fact Sheet for Healthcare Providers: IncredibleEmployment.be  This test is not yet approved or cleared by the [place] FDA and has been authorized for detection and/or diagnosis of SARS-CoV-2 by FDA under an Emergency Use Authorization (EUA). This EUA will remain in effect (meaning this test can be used) for the duration of the COVID-19 declaration under Section 564(b)(1) of the Act, 21 U.S.C. section 360bbb-3(b)(1), unless the authorization is terminated or revoked.     Resp Syncytial Virus by PCR 09/30/2022 NEGATIVE  NEGATIVE Final   Comment: (NOTE) Fact Sheet for Patients: EntrepreneurPulse.com.au  Fact Sheet for Healthcare Providers: IncredibleEmployment.be  This test is not yet approved or cleared by the [place] FDA and has been authorized for detection and/or diagnosis of SARS-CoV-2 by FDA under an Emergency Use Authorization (EUA). This EUA will remain in effect (meaning this test can be used) for the duration of the COVID-19 declaration under Section 564(b)(1) of the Act, 21 U.S.C. section 360bbb-3(b)(1), unless the authorization is terminated or revoked.  Performed at [facility], [place] [address], [place], [place] [address]    Sodium 10/01/2022 136  135 - 145 mmol/L Final   Potassium 10/01/2022  3.8  3.5 - 5.1 mmol/L Final   Chloride 10/01/2022 104  98 - 111 mmol/L Final   CO2 10/01/2022 27  22 - 32  mmol/L Final   Glucose, Bld 10/01/2022 98  70 - 99 mg/dL Final   Glucose reference range applies only to samples taken after fasting for at least 8 hours.   BUN 10/01/2022 12  6 - 20 mg/dL Final   Creatinine, Ser 10/01/2022 0.98  0.44 - 1.00 mg/dL Final   Calcium 78/67/5449 8.9  8.9 - 10.3 mg/dL Final   GFR, Estimated 10/01/2022 >60  >60 mL/min Final   Comment: (NOTE) Calculated using the CKD-EPI Creatinine Equation (2021)    Anion gap 10/01/2022 5  5 - 15 Final   Performed at [facility], [address] [place], [place] [address]   WBC 10/01/2022 5.1  4.0 - 10.5 K/uL Final   RBC 10/01/2022 4.31  3.87 - 5.11 MIL/uL Final   Hemoglobin 10/01/2022 12.7  12.0 - 15.0 g/dL Final   HCT 71/21/9758 38.8  36.0 - 46.0 % Final   MCV 10/01/2022 90.0  80.0 - 100.0 fL Final   MCH 10/01/2022 29.5  26.0 - 34.0 pg Final   MCHC 10/01/2022 32.7  30.0 - 36.0 g/dL Final   RDW 83/25/4982 12.4  11.5 - 15.5 % Final   Platelets 10/01/2022 300  150 - 400 K/uL Final   nRBC 10/01/2022 0.0  0.0 - 0.2 % Final   Performed at [facility], [address] [place], [place] [address]   Lipase 10/01/2022 29  11 - 51 U/L Final   Performed at [facility], [address], [place], [place] [address]   Color, Urine 10/05/2022 YELLOW  YELLOW Final   APPearance 10/05/2022 HAZY (A)  CLEAR Final   Specific Gravity, Urine 10/05/2022 1.018  1.005 - 1.030 Final   pH 10/05/2022 5.0  5.0 - 8.0 Final   Glucose, UA 10/05/2022 NEGATIVE  NEGATIVE mg/dL Final   Hgb urine dipstick 10/05/2022 NEGATIVE  NEGATIVE Final   Bilirubin Urine 10/05/2022 NEGATIVE  NEGATIVE Final   Ketones, ur 10/05/2022 NEGATIVE  NEGATIVE mg/dL Final   Protein, ur 76/80/8811 NEGATIVE  NEGATIVE mg/dL Final   Nitrite 12/26/9456 NEGATIVE  NEGATIVE Final   Leukocytes,Ua 10/05/2022 TRACE (A)  NEGATIVE Final   RBC / HPF 10/05/2022 0-5  0 - 5 RBC/hpf Final   WBC, UA 10/05/2022 0-5  0 - 5 WBC/hpf Final   Bacteria, UA 10/05/2022 RARE (A)  NONE SEEN  Final   Squamous Epithelial / LPF 10/05/2022 0-5  0 - 5 Final   Mucus 10/05/2022 PRESENT   Final   Performed at [facility], [address], [place], [place] [address]   Specimen Description 10/05/2022 URINE, CLEAN CATCH   Final   Special Requests 10/05/2022    Final                   Value:NONE Performed at [facility], [address], [place], [place] [address]    Culture 10/05/2022 10,000 COLONIES/mL MULTIPLE SPECIES PRESENT, SUGGEST RECOLLECTION (A)   Final   Report Status 10/05/2022 10/07/2022 FINAL   Final  Admission on 07/04/2022, Discharged on 07/05/2022  Component Date Value Ref Range Status   Sodium 07/04/2022 142  135 - 145 mmol/L Final   Potassium 07/04/2022 4.4  3.5 - 5.1 mmol/L Final   Chloride 07/04/2022 109  98 - 111 mmol/L Final   CO2 07/04/2022  26  22 - 32 mmol/L Final   Glucose, Bld 07/04/2022 96  70 - 99 mg/dL Final   Glucose reference range applies only to samples taken after fasting for at least 8 hours.   BUN 07/04/2022 15  6 - 20 mg/dL Final   Creatinine, Ser 07/04/2022 0.83  0.44 - 1.00 mg/dL Final   Calcium 07/04/2022 9.3  8.9 - 10.3 mg/dL Final   Total Protein 07/04/2022 7.2  6.5 - 8.1 g/dL Final   Albumin 07/04/2022 3.9  3.5 - 5.0 g/dL Final   AST 07/04/2022 23  15 - 41 U/L Final   ALT 07/04/2022 28  0 - 44 U/L Final   Alkaline Phosphatase 07/04/2022 77  38 - 126 U/L Final   Total Bilirubin 07/04/2022 0.4  0.3 - 1.2 mg/dL Final   GFR, Estimated 07/04/2022 >60  >60 mL/min Final   Comment: (NOTE) Calculated using the CKD-EPI Creatinine Equation (2021)    Anion gap 07/04/2022 7  5 - 15 Final   Performed at [facility], [place] [address] [place], [place] [address]   Alcohol, Ethyl (B) 07/04/2022 <10  <10 mg/dL Final   Comment: (NOTE) Lowest detectable limit for serum alcohol is 10 mg/dL.  For medical purposes only. Performed at [facility], [place] [address], [place], [place] [address]    WBC 07/04/2022  7.0  4.0 - 10.5 K/uL Final   RBC 07/04/2022 4.18  3.87 - 5.11 MIL/uL Final   Hemoglobin 07/04/2022 12.8  12.0 - 15.0 g/dL Final   HCT 07/04/2022 39.1  36.0 - 46.0 % Final   MCV 07/04/2022 93.5  80.0 - 100.0 fL Final   MCH 07/04/2022 30.6  26.0 - 34.0 pg Final   MCHC 07/04/2022 32.7  30.0 - 36.0 g/dL Final   RDW 07/04/2022 12.9  11.5 - 15.5 % Final   Platelets 07/04/2022 243  150 - 400 K/uL Final   nRBC 07/04/2022 0.0  0.0 - 0.2 % Final   Neutrophils Relative % 07/04/2022 43  % Final   Neutro Abs 07/04/2022 3.0  1.7 - 7.7 K/uL Final   Lymphocytes Relative 07/04/2022 50  % Final   Lymphs Abs 07/04/2022 3.5  0.7 - 4.0 K/uL Final   Monocytes Relative 07/04/2022 7  % Final   Monocytes Absolute 07/04/2022 0.5  0.1 - 1.0 K/uL Final   Eosinophils Relative 07/04/2022 0  % Final   Eosinophils Absolute 07/04/2022 0.0  0.0 - 0.5 K/uL Final   Basophils Relative 07/04/2022 0  % Final   Basophils Absolute 07/04/2022 0.0  0.0 - 0.1 K/uL Final   Immature Granulocytes 07/04/2022 0  % Final   Abs Immature Granulocytes 07/04/2022 0.01  0.00 - 0.07 K/uL Final   Performed at [facility], [place] [address], [place], [place] [address]   I-stat hCG, quantitative 07/04/2022 <5.0  <5 mIU/mL Final   Comment 3 07/04/2022          Final   Comment:   GEST. AGE      CONC.  (mIU/mL)   <=1 WEEK        5 - 50     2 WEEKS       50 - 500     3 WEEKS       100 - 10,000     4 WEEKS     1,000 - 30,000        FEMALE AND NON-PREGNANT FEMALE:     LESS THAN 5  mIU/mL   Admission on 06/14/2022, Discharged on 06/14/2022  Component Date Value Ref Range Status   Color, UA 06/14/2022 yellow  yellow Final   Clarity, UA 06/14/2022 cloudy (A)  clear Final   Glucose, UA 06/14/2022 negative  negative mg/dL Final   Bilirubin, UA 06/14/2022 negative  negative Final   Ketones, POC UA 06/14/2022 negative  negative mg/dL Final   Spec Grav, UA 06/14/2022 1.025  1.010 - 1.025 Final   Blood, UA 06/14/2022 negative   negative Final   pH, UA 06/14/2022 7.5  5.0 - 8.0 Final   Protein Ur, POC 06/14/2022 =30 (A)  negative mg/dL Final   Urobilinogen, UA 06/14/2022 0.2  0.2 or 1.0 E.U./dL Final   Nitrite, UA 06/14/2022 Negative  Negative Final   Leukocytes, UA 06/14/2022 Negative  Negative Final   Preg Test, Ur 06/14/2022 Negative  Negative Final   Specimen Description 06/14/2022 URINE, CLEAN CATCH   Final   Special Requests 06/14/2022    Final                   Value:NONE Performed at [facility], [place] [address], [place], [place] [address]    Culture 06/14/2022 MULTIPLE SPECIES PRESENT, SUGGEST RECOLLECTION (A)   Final   Report Status 06/14/2022 06/16/2022 FINAL   Final  Admission on 06/07/2022, Discharged on 06/10/2022  Component Date Value Ref Range Status   SARS Coronavirus 2 by RT PCR 06/07/2022 NEGATIVE  NEGATIVE Final   Comment: (NOTE) SARS-CoV-2 target nucleic acids are NOT DETECTED.  The SARS-CoV-2 RNA is generally detectable in upper respiratory specimens during the acute phase of infection. The lowest concentration of SARS-CoV-2 viral copies this assay can detect is 138 copies/mL. A negative result does not preclude SARS-Cov-2 infection and should not be used as the sole basis for treatment or other patient management decisions. A negative result may occur with  improper specimen collection/handling, submission of specimen other than nasopharyngeal swab, presence of viral mutation(s) within the areas targeted by this assay, and inadequate number of viral copies(<138 copies/mL). A negative result must be combined with clinical observations, patient history, and epidemiological information. The expected result is Negative.  Fact Sheet for Patients:  EntrepreneurPulse.com.au  Fact Sheet for Healthcare Providers:  IncredibleEmployment.be  This test is no                          t yet approved or cleared by the [place] FDA and  has been  authorized for detection and/or diagnosis of SARS-CoV-2 by FDA under an Emergency Use Authorization (EUA). This EUA will remain  in effect (meaning this test can be used) for the duration of the COVID-19 declaration under Section 564(b)(1) of the Act, 21 U.S.C.section 360bbb-3(b)(1), unless the authorization is terminated  or revoked sooner.       Influenza A by PCR 06/07/2022 NEGATIVE  NEGATIVE Final   Influenza B by PCR 06/07/2022 NEGATIVE  NEGATIVE Final   Comment: (NOTE) The Xpert Xpress SARS-CoV-2/FLU/RSV plus assay is intended as an aid in the diagnosis of influenza from Nasopharyngeal swab specimens and should not be used as a sole basis for treatment. Nasal washings and aspirates are unacceptable for Xpert Xpress SARS-CoV-2/FLU/RSV testing.  Fact Sheet for Patients: EntrepreneurPulse.com.au  Fact Sheet for Healthcare Providers: IncredibleEmployment.be  This test is not yet approved or cleared by the [place] FDA and has been authorized for detection and/or diagnosis of SARS-CoV-2 by FDA under an Emergency  Use Authorization (EUA). This EUA will remain in effect (meaning this test can be used) for the duration of the COVID-19 declaration under Section 564(b)(1) of the Act, 21 U.S.C. section 360bbb-3(b)(1), unless the authorization is terminated or revoked.  Performed at [facility], [place] [address] [place], [place] [address]    WBC 06/07/2022 5.7  4.0 - 10.5 K/uL Final   RBC 06/07/2022 4.39  3.87 - 5.11 MIL/uL Final   Hemoglobin 06/07/2022 13.2  12.0 - 15.0 g/dL Final   HCT 06/07/2022 40.4  36.0 - 46.0 % Final   MCV 06/07/2022 92.0  80.0 - 100.0 fL Final   MCH 06/07/2022 30.1  26.0 - 34.0 pg Final   MCHC 06/07/2022 32.7  30.0 - 36.0 g/dL Final   RDW 06/07/2022 12.4  11.5 - 15.5 % Final   Platelets 06/07/2022 308  150 - 400 K/uL Final   nRBC 06/07/2022 0.0  0.0 - 0.2 % Final   Neutrophils Relative % 06/07/2022 42  %  Final   Neutro Abs 06/07/2022 2.4  1.7 - 7.7 K/uL Final   Lymphocytes Relative 06/07/2022 54  % Final   Lymphs Abs 06/07/2022 3.1  0.7 - 4.0 K/uL Final   Monocytes Relative 06/07/2022 4  % Final   Monocytes Absolute 06/07/2022 0.2  0.1 - 1.0 K/uL Final   Eosinophils Relative 06/07/2022 0  % Final   Eosinophils Absolute 06/07/2022 0.0  0.0 - 0.5 K/uL Final   Basophils Relative 06/07/2022 0  % Final   Basophils Absolute 06/07/2022 0.0  0.0 - 0.1 K/uL Final   Immature Granulocytes 06/07/2022 0  % Final   Abs Immature Granulocytes 06/07/2022 0.01  0.00 - 0.07 K/uL Final   Performed at [facility], [place] [address] [place], [place] [address]   Sodium 06/07/2022 139  135 - 145 mmol/L Final   Potassium 06/07/2022 4.0  3.5 - 5.1 mmol/L Final   Chloride 06/07/2022 104  98 - 111 mmol/L Final   CO2 06/07/2022 28  22 - 32 mmol/L Final   Glucose, Bld 06/07/2022 104 (H)  70 - 99 mg/dL Final   Glucose reference range applies only to samples taken after fasting for at least 8 hours.   BUN 06/07/2022 8  6 - 20 mg/dL Final   Creatinine, Ser 06/07/2022 0.84  0.44 - 1.00 mg/dL Final   Calcium 06/07/2022 9.1  8.9 - 10.3 mg/dL Final   Total Protein 06/07/2022 6.9  6.5 - 8.1 g/dL Final   Albumin 06/07/2022 3.7  3.5 - 5.0 g/dL Final   AST 06/07/2022 19  15 - 41 U/L Final   ALT 06/07/2022 22  0 - 44 U/L Final   Alkaline Phosphatase 06/07/2022 54  38 - 126 U/L Final   Total Bilirubin 06/07/2022 0.4  0.3 - 1.2 mg/dL Final   GFR, Estimated 06/07/2022 >60  >60 mL/min Final   Comment: (NOTE) Calculated using the CKD-EPI Creatinine Equation (2021)    Anion gap 06/07/2022 7  5 - 15 Final   Performed at [place] [address] [place], [place] [address]   Hgb A1c MFr Bld 06/07/2022 5.0  4.8 - 5.6 % Final   Comment: (NOTE) Pre diabetes:          5.7%-6.4%  Diabetes:              >6.4%  Glycemic control for   <7.0% adults with diabetes    Mean Plasma Glucose 06/07/2022 96.8  mg/dL Final  Performed at [facility], [place] [address] [place], [place] [address]   TSH 06/07/2022 1.620  0.350 - 4.500 uIU/mL Final   Comment: Performed by a 3rd Generation assay with a functional sensitivity of <=0.01 uIU/mL. Performed at [facility], [place] [address], [place], [place] [address]    RPR Ser Ql 06/07/2022 NON REACTIVE  NON REACTIVE Final   Performed at [facility], [place] [address], [place], [place] [address]   Color, Urine 06/07/2022 YELLOW  YELLOW Final   APPearance 06/07/2022 HAZY (A)  CLEAR Final   Specific Gravity, Urine 06/07/2022 1.018  1.005 - 1.030 Final   pH 06/07/2022 7.0  5.0 - 8.0 Final   Glucose, UA 06/07/2022 NEGATIVE  NEGATIVE mg/dL Final   Hgb urine dipstick 06/07/2022 NEGATIVE  NEGATIVE Final   Bilirubin Urine 06/07/2022 NEGATIVE  NEGATIVE Final   Ketones, ur 06/07/2022 NEGATIVE  NEGATIVE mg/dL Final   Protein, ur 06/07/2022 NEGATIVE  NEGATIVE mg/dL Final   Nitrite 06/07/2022 NEGATIVE  NEGATIVE Final   Leukocytes,Ua 06/07/2022 NEGATIVE  NEGATIVE Final   Performed at [facility], [place] [address], [place], [place] [address]   Cholesterol 06/07/2022 164  0 - 200 mg/dL Final   Triglycerides 06/07/2022 158 (H)  <150 mg/dL Final   HDL 06/07/2022 44  >40 mg/dL Final   Total CHOL/HDL Ratio 06/07/2022 3.7  RATIO Final   VLDL 06/07/2022 32  0 - 40 mg/dL Final   LDL Cholesterol 06/07/2022 88  0 - 99 mg/dL Final   Comment:        Total Cholesterol/HDL:CHD Risk Coronary Heart Disease Risk Table                     Men   Women  1/2 Average Risk   3.4   3.3  Average Risk       5.0   4.4  2 X Average Risk   9.6   7.1  3 X Average Risk  23.4   11.0        Use the calculated Patient Ratio above and the CHD Risk Table to determine the patient's CHD Risk.        ATP III CLASSIFICATION (LDL):  <100     mg/dL   Optimal  100-129  mg/dL   Near or Above                    Optimal  130-159  mg/dL   Borderline  160-189  mg/dL   High  >190     mg/dL   Very  High Performed at [place] [address], [place], [place] [address]    HIV Screen 4th Generation wRfx 06/07/2022 Non Reactive  Non Reactive Final   Performed at [facility], [place] [address], [place],  [address]   SARSCOV2ONAVIRUS 2 AG 06/07/2022 NEGATIVE  NEGATIVE Final   Comment: (NOTE) SARS-CoV-2 antigen NOT DETECTED.   Negative results are presumptive.  Negative results do not preclude SARS-CoV-2 infection and should not be used as the sole basis for treatment or other patient management decisions, including infection  control decisions, particularly in the presence of clinical signs and  symptoms consistent with COVID-19, or in those who have been in contact with the virus.  Negative results must be combined with clinical observations, patient history, and epidemiological information. The expected result is Negative.  Fact Sheet for Patients: HandmadeRecipes.com.cy  Fact Sheet for Healthcare  Providers: FuneralLife.at  This test is not yet approved or cleared by the [place] and  has been authorized for detection and/or diagnosis of SARS-CoV-2 by FDA under an Emergency Use Authorization (EUA).  This EUA will remain in effect (meaning this test can be used) for the duration of  the COV                          ID-19 declaration under Section 564(b)(1) of the Act, 21 U.S.C. section 360bbb-3(b)(1), unless the authorization is terminated or revoked sooner.     POC Amphetamine UR 06/07/2022 None Detected  NONE DETECTED (Cut Off Level 1000 ng/mL) Final   POC Secobarbital (BAR) 06/07/2022 None Detected  NONE DETECTED (Cut Off Level 300 ng/mL) Final   POC Buprenorphine (BUP) 06/07/2022 None Detected  NONE DETECTED (Cut Off Level 10 ng/mL) Final   POC Oxazepam (BZO) 06/07/2022 None Detected  NONE DETECTED (Cut Off Level 300 ng/mL) Final   POC Cocaine UR 06/07/2022 None Detected  NONE DETECTED (Cut Off  Level 300 ng/mL) Final   POC Methamphetamine UR 06/07/2022 None Detected  NONE DETECTED (Cut Off Level 1000 ng/mL) Final   POC Morphine 06/07/2022 None Detected  NONE DETECTED (Cut Off Level 300 ng/mL) Final   POC Methadone UR 06/07/2022 None Detected  NONE DETECTED (Cut Off Level 300 ng/mL) Final   POC Oxycodone UR 06/07/2022 None Detected  NONE DETECTED (Cut Off Level 100 ng/mL) Final   POC Marijuana UR 06/07/2022 None Detected  NONE DETECTED (Cut Off Level 50 ng/mL) Final   Preg Test, Ur 06/08/2022 NEGATIVE  NEGATIVE Final   Comment:        THE SENSITIVITY OF THIS METHODOLOGY IS >20 mIU/mL. Performed at [facility], [place] [address] [place], [place] [address]    Preg Test, Ur 06/08/2022 NEGATIVE  NEGATIVE Final   Comment:        THE SENSITIVITY OF THIS METHODOLOGY IS >24 mIU/mL     Blood Alcohol level:  Lab Results  Component Value Date   ETH <10 07/04/2022   ETH <10 [id number]    Metabolic Disorder Labs: Lab Results  Component Value Date   HGBA1C 5.0 06/07/2022   MPG 96.8 06/07/2022   MPG 96.8 02/07/2022   No results found for: "PROLACTIN" Lab Results  Component Value Date   CHOL 181 07/25/2022   TRIG 118 07/25/2022   HDL 45 07/25/2022   CHOLHDL 4.0 07/25/2022   VLDL 24 07/25/2022   LDLCALC 112 (H) 07/25/2022   LDLCALC 88 06/07/2022    Therapeutic Lab Levels: No results found for: "LITHIUM" No results found for: "VALPROATE" No results found for: "CBMZ"  Physical Findings   GAD-7    Flowsheet Row Office Visit from 12/17/2021 in [place]  Total GAD-7 Score 17      PHQ2-9    [place] ED from 06/07/2022 in [facility] Office Visit from 12/17/2021 in [place]  PHQ-2 Total Score 1 2  PHQ-9 Total Score 2 8      Flowsheet Row ED from 10/07/2022 in [facility] Most recent reading at 10/07/2022  8:16 PM ED from 10/07/2022 in [place] Most recent reading at 10/07/2022 12:18 PM ED from 07/25/2022 in [facility] [place] Most recent reading at 10/06/2022  9:38 AM  C-SSRS RISK CATEGORY No Risk No Risk  No Risk       Psychiatric Specialty Exam  Presentation  General Appearance:  Appropriate for Environment; Casual; Fairly Groomed  Eye Contact: Good  Speech: Clear and Coherent; Normal Rate  Speech Volume: Normal  Handedness: Right   Mood and Affect  Mood: Euthymic  Affect: Appropriate; Congruent; Full Range   Thought Process  Thought Processes: Coherent; Goal Directed; Linear  Descriptions of Associations:Intact  Orientation:Full (Time, Place and Person)  Thought Content:Logical; WDL  Diagnosis of Schizophrenia or Schizoaffective disorder in past: Yes    Hallucinations:Hallucinations: None  Ideas of Reference:None  Suicidal Thoughts:Suicidal Thoughts: No  Homicidal Thoughts:Homicidal Thoughts: No   Sensorium  Memory: Immediate Good  Judgment: Fair  Insight: Fair   Executive Functions  Concentration: Good  Attention Span: Good  Recall: Good  Fund of Knowledge: Good  Language: Good   Psychomotor Activity  Psychomotor Activity: Psychomotor Activity: Normal   Assets  Assets: Communication Skills; Desire for Improvement   Sleep  Sleep: Sleep: Good  Physical Exam  Physical Exam Vitals and nursing note reviewed.  Constitutional:      General: She is not in acute distress.    Appearance: Normal appearance. She is not ill-appearing, toxic-appearing or diaphoretic.  HENT:     Head: Normocephalic and atraumatic.  Eyes:     Extraocular Movements: Extraocular movements intact.  Cardiovascular:     Rate and Rhythm: Normal rate.  Pulmonary:     Effort: Pulmonary effort is normal. No respiratory distress.  Neurological:     General: No focal deficit present.     Mental Status: She is alert and  oriented to person, place, and time.    Review of Systems  Respiratory:  Negative for shortness of breath.   Cardiovascular:  Negative for chest pain.  Gastrointestinal:  Positive for abdominal pain and constipation. Negative for nausea and vomiting.  Neurological:  Negative for dizziness and headaches.  Psychiatric/Behavioral: Negative.     Blood pressure 113/77, pulse (!) 114, temperature 98 F (36.7 C), temperature source Oral, resp. rate 20, height 5\' 1"  (1.549 m), weight 198 lb (89.8 kg), SpO2 99 %. Body mass index is 37.41 kg/m.  Treatment Plan Summary: Continue collaborative efforts to secure appropriate residential facility placement for patient with T[facility]/[employer]. Patient remains psych cleared.   Constipation Started miralax BID  Merrily Brittle, [id number] 10/13/2022 10:26 AM

## 2022-10-13 NOTE — ED Notes (Signed)
Pt resting quietly, breathing is even and unlabored.  Pt denies SI, HI, pain and AVH.  Will continue to monitor for safety.  

## 2022-10-13 NOTE — ED Notes (Signed)
Patient  sleeping in no acute stress. RR even and unlabored .Environment secured .Will continue to monitor for safely. 

## 2022-10-13 NOTE — ED Notes (Signed)
Pt sitting quietly coloring.  No acute distress noted.  Will continue to monitor for safety.

## 2022-10-14 DIAGNOSIS — E118 Type 2 diabetes mellitus with unspecified complications: Secondary | ICD-10-CM | POA: Diagnosis not present

## 2022-10-14 DIAGNOSIS — N9489 Other specified conditions associated with female genital organs and menstrual cycle: Secondary | ICD-10-CM | POA: Diagnosis not present

## 2022-10-14 DIAGNOSIS — F17200 Nicotine dependence, unspecified, uncomplicated: Secondary | ICD-10-CM | POA: Diagnosis not present

## 2022-10-14 DIAGNOSIS — F419 Anxiety disorder, unspecified: Secondary | ICD-10-CM | POA: Diagnosis not present

## 2022-10-14 DIAGNOSIS — R000 Tachycardia, unspecified: Secondary | ICD-10-CM | POA: Diagnosis not present

## 2022-10-14 DIAGNOSIS — Z1152 Encounter for screening for COVID-19: Secondary | ICD-10-CM | POA: Diagnosis not present

## 2022-10-14 DIAGNOSIS — E039 Hypothyroidism, unspecified: Secondary | ICD-10-CM | POA: Diagnosis not present

## 2022-10-14 DIAGNOSIS — Z5901 Sheltered homelessness: Secondary | ICD-10-CM | POA: Diagnosis not present

## 2022-10-14 DIAGNOSIS — R300 Dysuria: Secondary | ICD-10-CM | POA: Diagnosis present

## 2022-10-14 DIAGNOSIS — R45851 Suicidal ideations: Secondary | ICD-10-CM | POA: Diagnosis not present

## 2022-10-14 DIAGNOSIS — N390 Urinary tract infection, site not specified: Secondary | ICD-10-CM | POA: Diagnosis not present

## 2022-10-14 DIAGNOSIS — F209 Schizophrenia, unspecified: Secondary | ICD-10-CM | POA: Diagnosis not present

## 2022-10-14 DIAGNOSIS — Z79899 Other long term (current) drug therapy: Secondary | ICD-10-CM | POA: Diagnosis not present

## 2022-10-14 DIAGNOSIS — F333 Major depressive disorder, recurrent, severe with psychotic symptoms: Secondary | ICD-10-CM | POA: Diagnosis not present

## 2022-10-14 MED ORDER — POLYETHYLENE GLYCOL 3350 17 G PO PACK
17.0000 g | PACK | Freq: Every day | ORAL | Status: DC
  Administered 2022-10-14 – 2022-11-23 (×41): 17 g via ORAL
  Filled 2022-10-14 (×39): qty 1
  Filled 2022-10-14: qty 7
  Filled 2022-10-14 (×2): qty 1

## 2022-10-14 NOTE — ED Notes (Signed)
Pt resting quietly with eyes closed.  No pain or discomfort noted/voiced.  Breathing is even and unlabored.  Will continue to monitor for safety.  

## 2022-10-14 NOTE — ED Provider Notes (Addendum)
Behavioral Health Progress Note  Date and Time: 10/14/2022 10:05 AM Name: Nicole [name] MRN:  [id number]   Diagnosis:  Final diagnoses:  Tobacco use disorder  Schizophrenia, unspecified type (HCC)  MDD (major depressive disorder), recurrent episode, moderate (HCC)  Hypothyroidism, unspecified type    Total Time spent with patient: 20 minutes  Brief HPI: Nicole [name] 32 y.o., female patient with schizophrenia, MDD with psychosis, presented to [facility], voluntarily, as a walk-in accompanied by [employer] after a disagreement and verbal altercation with personnel at the group home.  She has been dismissed previously from other group homes due to n which she was residing in.  Patient was admitted to continuous assessment unit on 07/25/2022 as she also endorsed passive SI at that time along with symptoms of visual hallucination. Patient was subsequently psychiatrically cleared during her admission to the continuous assessment unit.  However patient remains [facility] at [facility] bordering until adequate- residential placement can be obtained.    Legal Guardian: [name](Maxine Brown) transitioning to be [name](Albert Brown) Point of contact: [name](Albert Brown)  Tests administered: Wechsler Adult Intelligence Scale-4 (see Jolene ProvostMark Lewis, PhD consult note on 08/19/2022 for full info) [place]-3 Adaptive Behavior Scales Assessment completed on 09/13/2022 administered by Lamar Sprinklesourtney Cosby, MD  Subjective: During evaluation Nicole MassonLatasha Qazi was initially awake, no acute distress, ambulating around the unit with steady gait.  On evaluation, patient was pleasant, calm and cooperative with evaluation.  She denied side effects of medication.  Reported that her sleep and appetite have been stable and appropriate.  Reported her mood as good. Reported that she was able to have a bowel movement this AM with the miralax, denied any other somatic concerns. She denied SI/HI/AVH, paranoia, contracted to safety.  Patient had no other  questions or concerns and was amenable to plan per below.  Past Medical History:  Past Medical History:  Diagnosis Date   Anxiety    Depression    Hypothyroidism 08/07/2022   Tobacco use disorder 08/07/2022    Past Surgical History:  Procedure Laterality Date   WISDOM TOOTH EXTRACTION Bilateral 2020   Family History:  Family History  Problem Relation Age of Onset   Hypertension Father    Diabetes Father     Social History:  Social History   Substance and Sexual Activity  Alcohol Use Never     Social History   Substance and Sexual Activity  Drug Use Never    Social History   Socioeconomic History   Marital status: Single    Spouse name: Not on file   Number of children: Not on file   Years of education: Not on file   Highest education level: Not on file  Occupational History   Not on file  Tobacco Use   Smoking status: Every Day    Types: Cigarettes   Smokeless tobacco: Never  Vaping Use   Vaping Use: Some days  Substance and Sexual Activity   Alcohol use: Never   Drug use: Never   Sexual activity: Yes    Partners: Female    Birth control/protection: Implant  Other Topics Concern   Not on file  Social History Narrative   Not on file   Social Determinants of Health   Financial Resource Strain: Not on file  Food Insecurity: Not on file  Transportation Needs: Not on file  Physical Activity: Not on file  Stress: Not on file  Social Connections: Not on file   SDOH:  SDOH Screenings   Depression (PHQ2-9):  Low Risk  (06/10/2022)  Tobacco Use: High Risk (10/13/2022)   Additional Social History:            Current Medications:  Current Facility-Administered Medications  Medication Dose Route Frequency Provider Last Rate Last Admin   acetaminophen (TYLENOL) tablet 650 mg  650 mg Oral Q6H PRN Marlou Sa, NP       alum & mag hydroxide-simeth (MAALOX/MYLANTA) 200-200-20 MG/5ML suspension 30 mL  30 mL Oral Q4H PRN Rayburn Go, Veronique M,  NP   30 mL at 10/10/22 1659   cetaphil lotion   Topical PRN Princess Bruins, DO       hydrOXYzine (ATARAX) tablet 25 mg  25 mg Oral TID PRN Lorri Frederick, MD   25 mg at 10/12/22 2140   levothyroxine (SYNTHROID) tablet 100 mcg  100 mcg Oral Once Byungura, Veronique M, NP       levothyroxine (SYNTHROID) tablet 100 mcg  100 mcg Oral Q0600 Marlou Sa, NP   100 mcg at 10/14/22 0538   loratadine (CLARITIN) tablet 10 mg  10 mg Oral Daily Carrion-Carrero, Karle Starch, MD   10 mg at 10/14/22 0904   magnesium hydroxide (MILK OF MAGNESIA) suspension 30 mL  30 mL Oral Daily PRN Marlou Sa, NP   30 mL at 10/12/22 2145   metFORMIN (GLUCOPHAGE) tablet 500 mg  500 mg Oral Q breakfast Carrion-Carrero, Karle Starch, MD   500 mg at 10/14/22 1610   nicotine (NICODERM CQ - dosed in mg/24 hours) patch 14 mg  14 mg Transdermal Daily PRN Carrion-Carrero, Karle Starch, MD       ondansetron (ZOFRAN-ODT) disintegrating tablet 4 mg  4 mg Oral Q8H PRN Carrion-Carrero, Margely, MD   4 mg at 10/10/22 1312   Oxcarbazepine (TRILEPTAL) tablet 300 mg  300 mg Oral BID Rayburn Go, Veronique M, NP   300 mg at 10/14/22 0904   pantoprazole (PROTONIX) EC tablet 40 mg  40 mg Oral Daily Carrion-Carrero, Margely, MD   40 mg at 10/14/22 0905   polyethylene glycol (MIRALAX / GLYCOLAX) packet 17 g  17 g Oral Daily Princess Bruins, DO       QUEtiapine (SEROQUEL) tablet 400 mg  400 mg Oral BID Rayburn Go, Veronique M, NP   400 mg at 10/14/22 9604   sertraline (ZOLOFT) tablet 150 mg  150 mg Oral Daily Carrion-Carrero, Karle Starch, MD   150 mg at 10/14/22 0904   traZODone (DESYREL) tablet 100 mg  100 mg Oral QHS Olin Pia M, NP   100 mg at 10/13/22 2103   traZODone (DESYREL) tablet 50 mg  50 mg Oral QHS PRN Marlou Sa, NP   50 mg at 10/13/22 2053   valACYclovir (VALTREX) tablet 500 mg  500 mg Oral Daily Carrion-Carrero, Karle Starch, MD   500 mg at 10/14/22 0904   Current Outpatient Medications  Medication Sig Dispense  Refill   ABILIFY MAINTENA 400 MG PRSY prefilled syringe 400 mg every 28 (twenty-eight) days.     cetirizine (ZYRTEC) 10 MG tablet Take 10 mg by mouth daily.     cyclobenzaprine (FLEXERIL) 10 MG tablet Take 1 tablet (10 mg total) by mouth 2 (two) times daily as needed for muscle spasms. 20 tablet 0   fluticasone (FLONASE) 50 MCG/ACT nasal spray Place 1 spray into both nostrils daily.     meloxicam (MOBIC) 15 MG tablet Take 15 mg by mouth daily.     nitrofurantoin, macrocrystal-monohydrate, (MACROBID) 100 MG capsule Take 1 capsule (100 mg total) by mouth 2 (two) times daily.  10 capsule 0   ondansetron (ZOFRAN-ODT) 4 MG disintegrating tablet Take 1 tablet (4 mg total) by mouth every 8 (eight) hours as needed for nausea or vomiting. 20 tablet 0   Oxcarbazepine (TRILEPTAL) 300 MG tablet Take 1 tablet (300 mg total) by mouth 2 (two) times daily. 60 tablet 0   QUEtiapine (SEROQUEL) 400 MG tablet Take 1 tablet (400 mg total) by mouth 2 (two) times daily. 60 tablet 0   sertraline (ZOLOFT) 50 MG tablet Take 3 tablets (150 mg total) by mouth in the morning. 90 tablet 0   traZODone (DESYREL) 100 MG tablet Take 1 tablet (100 mg total) by mouth at bedtime. 30 tablet 0   valACYclovir (VALTREX) 500 MG tablet Take 500 mg by mouth daily.      Labs  Lab Results:  Admission on 10/07/2022, Discharged on 10/07/2022  Component Date Value Ref Range Status   Color, Urine 10/07/2022 YELLOW  YELLOW Final   APPearance 10/07/2022 HAZY (A)  CLEAR Final   Specific Gravity, Urine 10/07/2022 1.011  1.005 - 1.030 Final   pH 10/07/2022 7.0  5.0 - 8.0 Final   Glucose, UA 10/07/2022 NEGATIVE  NEGATIVE mg/dL Final   Hgb urine dipstick 10/07/2022 SMALL (A)  NEGATIVE Final   Bilirubin Urine 10/07/2022 NEGATIVE  NEGATIVE Final   Ketones, ur 10/07/2022 NEGATIVE  NEGATIVE mg/dL Final   Protein, ur 16/07/9603 NEGATIVE  NEGATIVE mg/dL Final   Nitrite 54/06/8118 NEGATIVE  NEGATIVE Final   Leukocytes,Ua 10/07/2022 LARGE (A)  NEGATIVE  Final   RBC / HPF 10/07/2022 0-5  0 - 5 RBC/hpf Final   WBC, UA 10/07/2022 0-5  0 - 5 WBC/hpf Final   Bacteria, UA 10/07/2022 FEW (A)  NONE SEEN Final   Squamous Epithelial / LPF 10/07/2022 11-20  0 - 5 Final   Performed at [facility], [address] [place], [place] [address]   I-stat hCG, quantitative 10/07/2022 <5.0  <5 mIU/mL Final   Comment 3 10/07/2022          Final   Comment:   GEST. AGE      CONC.  (mIU/mL)   <=1 WEEK        5 - 50     2 WEEKS       50 - 500     3 WEEKS       100 - 10,000     4 WEEKS     1,000 - 30,000        FEMALE AND NON-PREGNANT FEMALE:     LESS THAN 5 mIU/mL    Sodium 10/07/2022 138  135 - 145 mmol/L Final   Potassium 10/07/2022 4.3  3.5 - 5.1 mmol/L Final   Chloride 10/07/2022 104  98 - 111 mmol/L Final   CO2 10/07/2022 28  22 - 32 mmol/L Final   Glucose, Bld 10/07/2022 90  70 - 99 mg/dL Final   Glucose reference range applies only to samples taken after fasting for at least 8 hours.   BUN 10/07/2022 8  6 - 20 mg/dL Final   Creatinine, Ser 10/07/2022 0.96  0.44 - 1.00 mg/dL Final   Calcium 95/62/1308 9.1  8.9 - 10.3 mg/dL Final   GFR, Estimated 10/07/2022 >60  >60 mL/min Final   Comment: (NOTE) Calculated using the CKD-EPI Creatinine Equation (2021)    Anion gap 10/07/2022 6  5 - 15 Final   Performed at [facility], [address], [place], [place] [address]  WBC 10/07/2022 5.8  4.0 - 10.5 K/uL Final   RBC 10/07/2022 4.36  3.87 - 5.11 MIL/uL Final   Hemoglobin 10/07/2022 12.9  12.0 - 15.0 g/dL Final   HCT 16/07/9603 40.0  36.0 - 46.0 % Final   MCV 10/07/2022 91.7  80.0 - 100.0 fL Final   MCH 10/07/2022 29.6  26.0 - 34.0 pg Final   MCHC 10/07/2022 32.3  30.0 - 36.0 g/dL Final   RDW 54/06/8118 12.4  11.5 - 15.5 % Final   Platelets 10/07/2022 295  150 - 400 K/uL Final   nRBC 10/07/2022 0.0  0.0 - 0.2 % Final   Performed at [facility], [address] [place], [place] [address]   SARS Coronavirus 2 by RT PCR 10/07/2022  NEGATIVE  NEGATIVE Final   Comment: (NOTE) SARS-CoV-2 target nucleic acids are NOT DETECTED.  The SARS-CoV-2 RNA is generally detectable in upper respiratory specimens during the acute phase of infection. The lowest concentration of SARS-CoV-2 viral copies this assay can detect is 138 copies/mL. A negative result does not preclude SARS-Cov-2 infection and should not be used as the sole basis for treatment or other patient management decisions. A negative result may occur with  improper specimen collection/handling, submission of specimen other than nasopharyngeal swab, presence of viral mutation(s) within the areas targeted by this assay, and inadequate number of viral copies(<138 copies/mL). A negative result must be combined with clinical observations, patient history, and epidemiological information. The expected result is Negative.  Fact Sheet for Patients:  BloggerCourse.com  Fact Sheet for Healthcare Providers:  SeriousBroker.it  This test is no                          t yet approved or cleared by the [place] FDA and  has been authorized for detection and/or diagnosis of SARS-CoV-2 by FDA under an Emergency Use Authorization (EUA). This EUA will remain  in effect (meaning this test can be used) for the duration of the COVID-19 declaration under Section 564(b)(1) of the Act, 21 U.S.C.section 360bbb-3(b)(1), unless the authorization is terminated  or revoked sooner.       Influenza A by PCR 10/07/2022 NEGATIVE  NEGATIVE Final   Influenza B by PCR 10/07/2022 NEGATIVE  NEGATIVE Final   Comment: (NOTE) The Xpert Xpress SARS-CoV-2/FLU/RSV plus assay is intended as an aid in the diagnosis of influenza from Nasopharyngeal swab specimens and should not be used as a sole basis for treatment. Nasal washings and aspirates are unacceptable for Xpert Xpress SARS-CoV-2/FLU/RSV testing.  Fact Sheet for  Patients: BloggerCourse.com  Fact Sheet for Healthcare Providers: SeriousBroker.it  This test is not yet approved or cleared by the [place] FDA and has been authorized for detection and/or diagnosis of SARS-CoV-2 by FDA under an Emergency Use Authorization (EUA). This EUA will remain in effect (meaning this test can be used) for the duration of the COVID-19 declaration under Section 564(b)(1) of the Act, 21 U.S.C. section 360bbb-3(b)(1), unless the authorization is terminated or revoked.     Resp Syncytial Virus by PCR 10/07/2022 NEGATIVE  NEGATIVE Final   Comment: (NOTE) Fact Sheet for Patients: BloggerCourse.com  Fact Sheet for Healthcare Providers: SeriousBroker.it  This test is not yet approved or cleared by the [place] FDA and has been authorized for detection and/or diagnosis of SARS-CoV-2 by FDA under an Emergency Use Authorization (EUA). This EUA will remain in effect (meaning this test can be used) for the duration  of the COVID-19 declaration under Section 564(b)(1) of the Act, 21 U.S.C. section 360bbb-3(b)(1), unless the authorization is terminated or revoked.  Performed at [facility], [address], [place], [place] [address]    Specimen Description 10/07/2022 URINE, CLEAN CATCH   Final   Special Requests 10/07/2022    Final                   Value:NONE Performed at [facility], [address] [place], [place] [address]    Culture 10/07/2022 MULTIPLE SPECIES PRESENT, SUGGEST RECOLLECTION (A)   Final   Report Status 10/07/2022 10/08/2022 FINAL   Final  Admission on 07/25/2022, Discharged on 10/07/2022  Component Date Value Ref Range Status   SARS Coronavirus 2 by RT PCR 07/25/2022 NEGATIVE  NEGATIVE Final   Comment: (NOTE) SARS-CoV-2 target nucleic acids are NOT DETECTED.  The SARS-CoV-2 RNA is generally detectable in upper  respiratory specimens during the acute phase of infection. The lowest concentration of SARS-CoV-2 viral copies this assay can detect is 138 copies/mL. A negative result does not preclude SARS-Cov-2 infection and should not be used as the sole basis for treatment or other patient management decisions. A negative result may occur with  improper specimen collection/handling, submission of specimen other than nasopharyngeal swab, presence of viral mutation(s) within the areas targeted by this assay, and inadequate number of viral copies(<138 copies/mL). A negative result must be combined with clinical observations, patient history, and epidemiological information. The expected result is Negative.  Fact Sheet for Patients:  BloggerCourse.com  Fact Sheet for Healthcare Providers:  SeriousBroker.it  This test is no                          t yet approved or cleared by the [place] FDA and  has been authorized for detection and/or diagnosis of SARS-CoV-2 by FDA under an Emergency Use Authorization (EUA). This EUA will remain  in effect (meaning this test can be used) for the duration of the COVID-19 declaration under Section 564(b)(1) of the Act, 21 U.S.C.section 360bbb-3(b)(1), unless the authorization is terminated  or revoked sooner.       Influenza A by PCR 07/25/2022 NEGATIVE  NEGATIVE Final   Influenza B by PCR 07/25/2022 NEGATIVE  NEGATIVE Final   Comment: (NOTE) The Xpert Xpress SARS-CoV-2/FLU/RSV plus assay is intended as an aid in the diagnosis of influenza from Nasopharyngeal swab specimens and should not be used as a sole basis for treatment. Nasal washings and aspirates are unacceptable for Xpert Xpress SARS-CoV-2/FLU/RSV testing.  Fact Sheet for Patients: BloggerCourse.com  Fact Sheet for Healthcare Providers: SeriousBroker.it  This test is not yet approved or  cleared by the [place] FDA and has been authorized for detection and/or diagnosis of SARS-CoV-2 by FDA under an Emergency Use Authorization (EUA). This EUA will remain in effect (meaning this test can be used) for the duration of the COVID-19 declaration under Section 564(b)(1) of the Act, 21 U.S.C. section 360bbb-3(b)(1), unless the authorization is terminated or revoked.  Performed at [facility], [address] [place], [place] [address]    WBC 07/25/2022 8.3  4.0 - 10.5 K/uL Final   RBC 07/25/2022 4.44  3.87 - 5.11 MIL/uL Final   Hemoglobin 07/25/2022 13.7  12.0 - 15.0 g/dL Final   HCT 56/97/9480 40.2  36.0 - 46.0 % Final   MCV 07/25/2022 90.5  80.0 - 100.0 fL Final   [facility] 07/25/2022  30.9  26.0 - 34.0 pg Final   MCHC 07/25/2022 34.1  30.0 - 36.0 g/dL Final   RDW 07/25/2022 12.2  11.5 - 15.5 % Final   Platelets 07/25/2022 248  150 - 400 K/uL Final   nRBC 07/25/2022 0.0  0.0 - 0.2 % Final   Neutrophils Relative % 07/25/2022 43  % Final   Neutro Abs 07/25/2022 3.6  1.7 - 7.7 K/uL Final   Lymphocytes Relative 07/25/2022 52  % Final   Lymphs Abs 07/25/2022 4.2 (H)  0.7 - 4.0 K/uL Final   Monocytes Relative 07/25/2022 5  % Final   Monocytes Absolute 07/25/2022 0.4  0.1 - 1.0 K/uL Final   Eosinophils Relative 07/25/2022 0  % Final   Eosinophils Absolute 07/25/2022 0.0  0.0 - 0.5 K/uL Final   Basophils Relative 07/25/2022 0  % Final   Basophils Absolute 07/25/2022 0.0  0.0 - 0.1 K/uL Final   Immature Granulocytes 07/25/2022 0  % Final   Abs Immature Granulocytes 07/25/2022 0.02  0.00 - 0.07 K/uL Final   Performed at [facility], [place] [address] [place], [place] [address]   Sodium 07/25/2022 138  135 - 145 mmol/L Final   Potassium 07/25/2022 4.0  3.5 - 5.1 mmol/L Final   Chloride 07/25/2022 104  98 - 111 mmol/L Final   CO2 07/25/2022 29  22 - 32 mmol/L Final   Glucose, Bld 07/25/2022 83  70 - 99 mg/dL Final   Glucose reference range applies only to samples taken  after fasting for at least 8 hours.   BUN 07/25/2022 11  6 - 20 mg/dL Final   Creatinine, Ser 07/25/2022 0.97  0.44 - 1.00 mg/dL Final   Calcium 07/25/2022 9.2  8.9 - 10.3 mg/dL Final   Total Protein 07/25/2022 7.0  6.5 - 8.1 g/dL Final   Albumin 07/25/2022 3.8  3.5 - 5.0 g/dL Final   AST 07/25/2022 18  15 - 41 U/L Final   ALT 07/25/2022 22  0 - 44 U/L Final   Alkaline Phosphatase 07/25/2022 64  38 - 126 U/L Final   Total Bilirubin 07/25/2022 0.2 (L)  0.3 - 1.2 mg/dL Final   GFR, Estimated 07/25/2022 >60  >60 mL/min Final   Comment: (NOTE) Calculated using the CKD-EPI Creatinine Equation (2021)    Anion gap 07/25/2022 5  5 - 15 Final   Performed at [place] [address], [place], [place] [address]   POC Amphetamine UR 07/25/2022 None Detected  NONE DETECTED (Cut Off Level 1000 ng/mL) Preliminary   POC Secobarbital (BAR) 07/25/2022 None Detected  NONE DETECTED (Cut Off Level 300 ng/mL) Preliminary   POC Buprenorphine (BUP) 07/25/2022 None Detected  NONE DETECTED (Cut Off Level 10 ng/mL) Preliminary   POC Oxazepam (BZO) 07/25/2022 None Detected  NONE DETECTED (Cut Off Level 300 ng/mL) Preliminary   POC Cocaine UR 07/25/2022 None Detected  NONE DETECTED (Cut Off Level 300 ng/mL) Preliminary   POC Methamphetamine UR 07/25/2022 None Detected  NONE DETECTED (Cut Off Level 1000 ng/mL) Preliminary   POC Morphine 07/25/2022 None Detected  NONE DETECTED (Cut Off Level 300 ng/mL) Preliminary   POC Methadone UR 07/25/2022 None Detected  NONE DETECTED (Cut Off Level 300 ng/mL) Preliminary   POC Oxycodone UR 07/25/2022 None Detected  NONE DETECTED (Cut Off Level 100 ng/mL) Preliminary   POC Marijuana UR 07/25/2022 None Detected  NONE DETECTED (Cut Off Level 50 ng/mL) Preliminary   SARSCOV2ONAVIRUS 2 AG 07/25/2022 NEGATIVE  NEGATIVE Final  Comment: (NOTE) SARS-CoV-2 antigen NOT DETECTED.   Negative results are presumptive.  Negative results do not preclude SARS-CoV-2 infection and  should not be used as the sole basis for treatment or other patient management decisions, including infection  control decisions, particularly in the presence of clinical signs and  symptoms consistent with COVID-19, or in those who have been in contact with the virus.  Negative results must be combined with clinical observations, patient history, and epidemiological information. The expected result is Negative.  Fact Sheet for Patients: https://www.jennings-kim.com/https://www.fda.gov/media/141569/download  Fact Sheet for Healthcare Providers: https://alexander-rogers.biz/https://www.fda.gov/media/141568/download  This test is not yet approved or cleared by the [place] FDA and  has been authorized for detection and/or diagnosis of SARS-CoV-2 by FDA under an Emergency Use Authorization (EUA).  This EUA will remain in effect (meaning this test can be used) for the duration of  the COV                          ID-19 declaration under Section 564(b)(1) of the Act, 21 U.S.C. section 360bbb-3(b)(1), unless the authorization is terminated or revoked sooner.     Cholesterol 07/25/2022 181  0 - 200 mg/dL Final   Triglycerides 16/10/960410/09/2022 118  <150 mg/dL Final   HDL 54/09/811910/09/2022 45  >40 mg/dL Final   Total CHOL/HDL Ratio 07/25/2022 4.0  RATIO Final   VLDL 07/25/2022 24  0 - 40 mg/dL Final   LDL Cholesterol 07/25/2022 112 (H)  0 - 99 mg/dL Final   Comment:        Total Cholesterol/HDL:CHD Risk Coronary Heart Disease Risk Table                     Men   Women  1/2 Average Risk   3.4   3.3  Average Risk       5.0   4.4  2 X Average Risk   9.6   7.1  3 X Average Risk  23.4   11.0        Use the calculated Patient Ratio above and the CHD Risk Table to determine the patient's CHD Risk.        ATP III CLASSIFICATION (LDL):  <100     mg/dL   Optimal  [id number]  mg/dL   Near or Above                    Optimal  130-159  mg/dL   Borderline  562-130160-189  mg/dL   High  >865>190     mg/dL   Very High Performed at [facility], [address] [place], [place] [address]    TSH 07/25/2022 6.668 (H)  0.350 - 4.500 uIU/mL Final   Comment: Performed by a 3rd Generation assay with a functional sensitivity of <=0.01 uIU/mL. Performed at [facility], [address] [place], [place] [address]    Glucose-Capillary 07/26/2022 104 (H)  70 - 99 mg/dL Final   Glucose reference range applies only to samples taken after fasting for at least 8 hours.   T3, Free 07/31/2022 2.3  2.0 - 4.4 pg/mL Final   Comment: (NOTE) Performed At: [facility] [address] [place], [place] [id number] Jolene SchimkeNagendra Sanjai MD UU:[id number]:[phone]    Free T4 07/31/2022 0.60 (L)  0.61 - 1.12 ng/dL Final   Comment: (NOTE) Biotin ingestion may interfere with free T4 tests. If the results are inconsistent with the TSH level, previous test  results, or the clinical presentation, then consider biotin interference. If needed, order repeat testing after stopping biotin. Performed at [facility], [address], [place], [place] [address]    Glucose-Capillary 08/29/2022 100 (H)  70 - 99 mg/dL Final   Glucose reference range applies only to samples taken after fasting for at least 8 hours.   TSH 09/05/2022 0.793  0.350 - 4.500 uIU/mL Final   Comment: Performed by a 3rd Generation assay with a functional sensitivity of <=0.01 uIU/mL. Performed at [facility], [address], [place], [place] [address]    Free T4 09/05/2022 0.73  0.61 - 1.12 ng/dL Final   Comment: (NOTE) Biotin ingestion may interfere with free T4 tests. If the results are inconsistent with the TSH level, previous test results, or the clinical presentation, then consider biotin interference. If needed, order repeat testing after stopping biotin. Performed at [facility], [address], [place], [place] [address]    Preg Test, Ur 09/06/2022 Negative  Negative Final   Preg Test, Ur 09/06/2022 NEGATIVE  NEGATIVE Final   Comment:        THE SENSITIVITY OF THIS METHODOLOGY IS  >24 mIU/mL    Preg Test, Ur 09/05/2022 NEGATIVE  NEGATIVE Final   Comment:        THE SENSITIVITY OF THIS METHODOLOGY IS >24 mIU/mL    Sodium 09/13/2022 136  135 - 145 mmol/L Final   Potassium 09/13/2022 4.5  3.5 - 5.1 mmol/L Final   Chloride 09/13/2022 105  98 - 111 mmol/L Final   CO2 09/13/2022 21 (L)  22 - 32 mmol/L Final   Glucose, Bld 09/13/2022 111 (H)  70 - 99 mg/dL Final   Glucose reference range applies only to samples taken after fasting for at least 8 hours.   BUN 09/13/2022 14  6 - 20 mg/dL Final   Creatinine, Ser 09/13/2022 0.92  0.44 - 1.00 mg/dL Final   Calcium 53/97/6734 9.1  8.9 - 10.3 mg/dL Final   GFR, Estimated 09/13/2022 >60  >60 mL/min Final   Comment: (NOTE) Calculated using the CKD-EPI Creatinine Equation (2021)    Anion gap 09/13/2022 10  5 - 15 Final   Performed at [facility], [address], [place], [place] [address]   Vitamin B-12 09/13/2022 585  180 - 914 pg/mL Final   Comment: (NOTE) This assay is not validated for testing neonatal or myeloproliferative syndrome specimens for Vitamin B12 levels. Performed at [facility], [address], [place], [place] [address]    Color, Urine 09/14/2022 YELLOW  YELLOW Final   APPearance 09/14/2022 HAZY (A)  CLEAR Final   Specific Gravity, Urine 09/14/2022 1.025  1.005 - 1.030 Final   pH 09/14/2022 5.0  5.0 - 8.0 Final   Glucose, UA 09/14/2022 NEGATIVE  NEGATIVE mg/dL Final   Hgb urine dipstick 09/14/2022 NEGATIVE  NEGATIVE Final   Bilirubin Urine 09/14/2022 NEGATIVE  NEGATIVE Final   Ketones, ur 09/14/2022 NEGATIVE  NEGATIVE mg/dL Final   Protein, ur 73/53/2992 NEGATIVE  NEGATIVE mg/dL Final   Nitrite 42/68/3419 NEGATIVE  NEGATIVE Final   Leukocytes,Ua 09/14/2022 TRACE (A)  NEGATIVE Final   RBC / HPF 09/14/2022 0-5  0 - 5 RBC/hpf Final   WBC, UA 09/14/2022 0-5  0 - 5 WBC/hpf Final   Bacteria, UA 09/14/2022 RARE (A)  NONE SEEN Final   Squamous Epithelial / LPF 09/14/2022 0-5  0 - 5 Final    Mucus 09/14/2022 PRESENT  Final   Performed at [facility], [address], [place], [place] [address]   Glucose-Capillary 09/16/2022 105 (H)  70 - 99 mg/dL Final   Glucose reference range applies only to samples taken after fasting for at least 8 hours.   SARS Coronavirus 2 by RT PCR 09/30/2022 NEGATIVE  NEGATIVE Final   Comment: (NOTE) SARS-CoV-2 target nucleic acids are NOT DETECTED.  The SARS-CoV-2 RNA is generally detectable in upper respiratory specimens during the acute phase of infection. The lowest concentration of SARS-CoV-2 viral copies this assay can detect is 138 copies/mL. A negative result does not preclude SARS-Cov-2 infection and should not be used as the sole basis for treatment or other patient management decisions. A negative result may occur with  improper specimen collection/handling, submission of specimen other than nasopharyngeal swab, presence of viral mutation(s) within the areas targeted by this assay, and inadequate number of viral copies(<138 copies/mL). A negative result must be combined with clinical observations, patient history, and epidemiological information. The expected result is Negative.  Fact Sheet for Patients:  BloggerCourse.com  Fact Sheet for Healthcare Providers:  SeriousBroker.it  This test is no                          t yet approved or cleared by the [place] FDA and  has been authorized for detection and/or diagnosis of SARS-CoV-2 by FDA under an Emergency Use Authorization (EUA). This EUA will remain  in effect (meaning this test can be used) for the duration of the COVID-19 declaration under Section 564(b)(1) of the Act, 21 U.S.C.section 360bbb-3(b)(1), unless the authorization is terminated  or revoked sooner.       Influenza A by PCR 09/30/2022 NEGATIVE  NEGATIVE Final   Influenza B by PCR 09/30/2022 NEGATIVE  NEGATIVE Final   Comment: (NOTE) The Xpert Xpress  SARS-CoV-2/FLU/RSV plus assay is intended as an aid in the diagnosis of influenza from Nasopharyngeal swab specimens and should not be used as a sole basis for treatment. Nasal washings and aspirates are unacceptable for Xpert Xpress SARS-CoV-2/FLU/RSV testing.  Fact Sheet for Patients: BloggerCourse.com  Fact Sheet for Healthcare Providers: SeriousBroker.it  This test is not yet approved or cleared by the [place] FDA and has been authorized for detection and/or diagnosis of SARS-CoV-2 by FDA under an Emergency Use Authorization (EUA). This EUA will remain in effect (meaning this test can be used) for the duration of the COVID-19 declaration under Section 564(b)(1) of the Act, 21 U.S.C. section 360bbb-3(b)(1), unless the authorization is terminated or revoked.     Resp Syncytial Virus by PCR 09/30/2022 NEGATIVE  NEGATIVE Final   Comment: (NOTE) Fact Sheet for Patients: BloggerCourse.com  Fact Sheet for Healthcare Providers: SeriousBroker.it  This test is not yet approved or cleared by the [place] FDA and has been authorized for detection and/or diagnosis of SARS-CoV-2 by FDA under an Emergency Use Authorization (EUA). This EUA will remain in effect (meaning this test can be used) for the duration of the COVID-19 declaration under Section 564(b)(1) of the Act, 21 U.S.C. section 360bbb-3(b)(1), unless the authorization is terminated or revoked.  Performed at [facility], [address], [place], [place] [address]    Sodium 10/01/2022 136  135 - 145 mmol/L Final   Potassium 10/01/2022 3.8  3.5 - 5.1 mmol/L Final   Chloride 10/01/2022 104  98 - 111 mmol/L Final   CO2 10/01/2022 27  22 - 32 mmol/L  Final   Glucose, Bld 10/01/2022 98  70 - 99 mg/dL Final   Glucose reference range applies only to samples taken after fasting for at least 8 hours.   BUN 10/01/2022  12  6 - 20 mg/dL Final   Creatinine, Ser 10/01/2022 0.98  0.44 - 1.00 mg/dL Final   Calcium 62/83/1517 8.9  8.9 - 10.3 mg/dL Final   GFR, Estimated 10/01/2022 >60  >60 mL/min Final   Comment: (NOTE) Calculated using the CKD-EPI Creatinine Equation (2021)    Anion gap 10/01/2022 5  5 - 15 Final   Performed at [facility], [address] [place], [place] [address]   WBC 10/01/2022 5.1  4.0 - 10.5 K/uL Final   RBC 10/01/2022 4.31  3.87 - 5.11 MIL/uL Final   Hemoglobin 10/01/2022 12.7  12.0 - 15.0 g/dL Final   HCT 37/07/6268 38.8  36.0 - 46.0 % Final   MCV 10/01/2022 90.0  80.0 - 100.0 fL Final   MCH 10/01/2022 29.5  26.0 - 34.0 pg Final   MCHC 10/01/2022 32.7  30.0 - 36.0 g/dL Final   RDW 48/54/6270 12.4  11.5 - 15.5 % Final   Platelets 10/01/2022 300  150 - 400 K/uL Final   nRBC 10/01/2022 0.0  0.0 - 0.2 % Final   Performed at [facility], [address], [place], [place] [address]   Lipase 10/01/2022 29  11 - 51 U/L Final   Performed at [facility], [address] [place], [place] [address]   Color, Urine 10/05/2022 YELLOW  YELLOW Final   APPearance 10/05/2022 HAZY (A)  CLEAR Final   Specific Gravity, Urine 10/05/2022 1.018  1.005 - 1.030 Final   pH 10/05/2022 5.0  5.0 - 8.0 Final   Glucose, UA 10/05/2022 NEGATIVE  NEGATIVE mg/dL Final   Hgb urine dipstick 10/05/2022 NEGATIVE  NEGATIVE Final   Bilirubin Urine 10/05/2022 NEGATIVE  NEGATIVE Final   Ketones, ur 10/05/2022 NEGATIVE  NEGATIVE mg/dL Final   Protein, ur 99/37/1696 NEGATIVE  NEGATIVE mg/dL Final   Nitrite 78/93/8101 NEGATIVE  NEGATIVE Final   Leukocytes,Ua 10/05/2022 TRACE (A)  NEGATIVE Final   RBC / HPF 10/05/2022 0-5  0 - 5 RBC/hpf Final   WBC, UA 10/05/2022 0-5  0 - 5 WBC/hpf Final   Bacteria, UA 10/05/2022 RARE (A)  NONE SEEN Final   Squamous Epithelial / LPF 10/05/2022 0-5  0 - 5 Final   Mucus 10/05/2022 PRESENT   Final   Performed at [facility], [address], [place], [place]  [address]   Specimen Description 10/05/2022 URINE, CLEAN CATCH   Final   Special Requests 10/05/2022    Final                   Value:NONE Performed at [facility], [address], [place], [place] [address]    Culture 10/05/2022 10,000 COLONIES/mL MULTIPLE SPECIES PRESENT, SUGGEST RECOLLECTION (A)   Final   Report Status 10/05/2022 10/07/2022 FINAL   Final  Admission on 07/04/2022, Discharged on 07/05/2022  Component Date Value Ref Range Status   Sodium 07/04/2022 142  135 - 145 mmol/L Final   Potassium 07/04/2022 4.4  3.5 - 5.1 mmol/L Final   Chloride 07/04/2022 109  98 - 111 mmol/L Final   CO2 07/04/2022 26  22 - 32 mmol/L Final   Glucose, Bld 07/04/2022 96  70 - 99 mg/dL Final   Glucose reference range applies only to samples  taken after fasting for at least 8 hours.   BUN 07/04/2022 15  6 - 20 mg/dL Final   Creatinine, Ser 07/04/2022 0.83  0.44 - 1.00 mg/dL Final   Calcium 16/07/9603 9.3  8.9 - 10.3 mg/dL Final   Total Protein 54/06/8118 7.2  6.5 - 8.1 g/dL Final   Albumin 14/78/2956 3.9  3.5 - 5.0 g/dL Final   AST 21/30/8657 23  15 - 41 U/L Final   ALT 07/04/2022 28  0 - 44 U/L Final   Alkaline Phosphatase 07/04/2022 77  38 - 126 U/L Final   Total Bilirubin 07/04/2022 0.4  0.3 - 1.2 mg/dL Final   GFR, Estimated 07/04/2022 >60  >60 mL/min Final   Comment: (NOTE) Calculated using the CKD-EPI Creatinine Equation (2021)    Anion gap 07/04/2022 7  5 - 15 Final   Performed at [facility], [address] [place], [place] [address]   Alcohol, Ethyl (B) 07/04/2022 <10  <10 mg/dL Final   Comment: (NOTE) Lowest detectable limit for serum alcohol is 10 mg/dL.  For medical purposes only. Performed at [facility], [address], [place], [place] [address]    WBC 07/04/2022 7.0  4.0 - 10.5 K/uL Final   RBC 07/04/2022 4.18  3.87 - 5.11 MIL/uL Final   Hemoglobin 07/04/2022 12.8  12.0 - 15.0 g/dL Final   HCT 41/32/4401 39.1  36.0 - 46.0 %  Final   MCV 07/04/2022 93.5  80.0 - 100.0 fL Final   MCH 07/04/2022 30.6  26.0 - 34.0 pg Final   MCHC 07/04/2022 32.7  30.0 - 36.0 g/dL Final   RDW 02/72/5366 12.9  11.5 - 15.5 % Final   Platelets 07/04/2022 243  150 - 400 K/uL Final   nRBC 07/04/2022 0.0  0.0 - 0.2 % Final   Neutrophils Relative % 07/04/2022 43  % Final   Neutro Abs 07/04/2022 3.0  1.7 - 7.7 K/uL Final   Lymphocytes Relative 07/04/2022 50  % Final   Lymphs Abs 07/04/2022 3.5  0.7 - 4.0 K/uL Final   Monocytes Relative 07/04/2022 7  % Final   Monocytes Absolute 07/04/2022 0.5  0.1 - 1.0 K/uL Final   Eosinophils Relative 07/04/2022 0  % Final   Eosinophils Absolute 07/04/2022 0.0  0.0 - 0.5 K/uL Final   Basophils Relative 07/04/2022 0  % Final   Basophils Absolute 07/04/2022 0.0  0.0 - 0.1 K/uL Final   Immature Granulocytes 07/04/2022 0  % Final   Abs Immature Granulocytes 07/04/2022 0.01  0.00 - 0.07 K/uL Final   Performed at [facility], [address], [place], [place] [address]   I-stat hCG, quantitative 07/04/2022 <5.0  <5 mIU/mL Final   Comment 3 07/04/2022          Final   Comment:   GEST. AGE      CONC.  (mIU/mL)   <=1 WEEK        5 - 50     2 WEEKS       50 - 500     3 WEEKS       100 - 10,000     4 WEEKS     1,000 - 30,000        FEMALE AND NON-PREGNANT FEMALE:     LESS THAN 5 mIU/mL   Admission on 06/14/2022, Discharged on 06/14/2022  Component Date Value Ref Range Status   Color, UA 06/14/2022 yellow  yellow Final   Clarity,  UA 06/14/2022 cloudy (A)  clear Final   Glucose, UA 06/14/2022 negative  negative mg/dL Final   Bilirubin, UA 81/19/1478 negative  negative Final   Ketones, POC UA 06/14/2022 negative  negative mg/dL Final   Spec Grav, UA 29/56/2130 1.025  1.010 - 1.025 Final   Blood, UA 06/14/2022 negative  negative Final   pH, UA 06/14/2022 7.5  5.0 - 8.0 Final   Protein Ur, POC 06/14/2022 =30 (A)  negative mg/dL Final   Urobilinogen, UA 06/14/2022 0.2  0.2 or 1.0 E.U./dL  Final   Nitrite, UA 86/57/8469 Negative  Negative Final   Leukocytes, UA 06/14/2022 Negative  Negative Final   Preg Test, Ur 06/14/2022 Negative  Negative Final   Specimen Description 06/14/2022 URINE, CLEAN CATCH   Final   Special Requests 06/14/2022    Final                   Value:NONE Performed at [facility], [address], [place], [place] [address]    Culture 06/14/2022 MULTIPLE SPECIES PRESENT, SUGGEST RECOLLECTION (A)   Final   Report Status 06/14/2022 06/16/2022 FINAL   Final  Admission on 06/07/2022, Discharged on 06/10/2022  Component Date Value Ref Range Status   SARS Coronavirus 2 by RT PCR 06/07/2022 NEGATIVE  NEGATIVE Final   Comment: (NOTE) SARS-CoV-2 target nucleic acids are NOT DETECTED.  The SARS-CoV-2 RNA is generally detectable in upper respiratory specimens during the acute phase of infection. The lowest concentration of SARS-CoV-2 viral copies this assay can detect is 138 copies/mL. A negative result does not preclude SARS-Cov-2 infection and should not be used as the sole basis for treatment or other patient management decisions. A negative result may occur with  improper specimen collection/handling, submission of specimen other than nasopharyngeal swab, presence of viral mutation(s) within the areas targeted by this assay, and inadequate number of viral copies(<138 copies/mL). A negative result must be combined with clinical observations, patient history, and epidemiological information. The expected result is Negative.  Fact Sheet for Patients:  BloggerCourse.com  Fact Sheet for Healthcare Providers:  SeriousBroker.it  This test is no                          t yet approved or cleared by the [place] FDA and  has been authorized for detection and/or diagnosis of SARS-CoV-2 by FDA under an Emergency Use Authorization (EUA). This EUA will remain  in effect (meaning this test can be used)  for the duration of the COVID-19 declaration under Section 564(b)(1) of the Act, 21 U.S.C.section 360bbb-3(b)(1), unless the authorization is terminated  or revoked sooner.       Influenza A by PCR 06/07/2022 NEGATIVE  NEGATIVE Final   Influenza B by PCR 06/07/2022 NEGATIVE  NEGATIVE Final   Comment: (NOTE) The Xpert Xpress SARS-CoV-2/FLU/RSV plus assay is intended as an aid in the diagnosis of influenza from Nasopharyngeal swab specimens and should not be used as a sole basis for treatment. Nasal washings and aspirates are unacceptable for Xpert Xpress SARS-CoV-2/FLU/RSV testing.  Fact Sheet for Patients: BloggerCourse.com  Fact Sheet for Healthcare Providers: SeriousBroker.it  This test is not yet approved or cleared by the [place] FDA and has been authorized for detection and/or diagnosis of SARS-CoV-2 by FDA under an Emergency Use Authorization (EUA). This EUA will remain in effect (meaning this test can be used) for the duration of the COVID-19 declaration under Section 564(b)(1) of the Act,  21 U.S.C. section 360bbb-3(b)(1), unless the authorization is terminated or revoked.  Performed at [facility], [address], [place], [place] [address]    WBC 06/07/2022 5.7  4.0 - 10.5 K/uL Final   RBC 06/07/2022 4.39  3.87 - 5.11 MIL/uL Final   Hemoglobin 06/07/2022 13.2  12.0 - 15.0 g/dL Final   HCT 60/45/4098 40.4  36.0 - 46.0 % Final   MCV 06/07/2022 92.0  80.0 - 100.0 fL Final   MCH 06/07/2022 30.1  26.0 - 34.0 pg Final   MCHC 06/07/2022 32.7  30.0 - 36.0 g/dL Final   RDW 11/91/4782 12.4  11.5 - 15.5 % Final   Platelets 06/07/2022 308  150 - 400 K/uL Final   nRBC 06/07/2022 0.0  0.0 - 0.2 % Final   Neutrophils Relative % 06/07/2022 42  % Final   Neutro Abs 06/07/2022 2.4  1.7 - 7.7 K/uL Final   Lymphocytes Relative 06/07/2022 54  % Final   Lymphs Abs 06/07/2022 3.1  0.7 - 4.0 K/uL Final   Monocytes Relative  06/07/2022 4  % Final   Monocytes Absolute 06/07/2022 0.2  0.1 - 1.0 K/uL Final   Eosinophils Relative 06/07/2022 0  % Final   Eosinophils Absolute 06/07/2022 0.0  0.0 - 0.5 K/uL Final   Basophils Relative 06/07/2022 0  % Final   Basophils Absolute 06/07/2022 0.0  0.0 - 0.1 K/uL Final   Immature Granulocytes 06/07/2022 0  % Final   Abs Immature Granulocytes 06/07/2022 0.01  0.00 - 0.07 K/uL Final   Performed at [facility], [address], [place], [place] [address]   Sodium 06/07/2022 139  135 - 145 mmol/L Final   Potassium 06/07/2022 4.0  3.5 - 5.1 mmol/L Final   Chloride 06/07/2022 104  98 - 111 mmol/L Final   CO2 06/07/2022 28  22 - 32 mmol/L Final   Glucose, Bld 06/07/2022 104 (H)  70 - 99 mg/dL Final   Glucose reference range applies only to samples taken after fasting for at least 8 hours.   BUN 06/07/2022 8  6 - 20 mg/dL Final   Creatinine, Ser 06/07/2022 0.84  0.44 - 1.00 mg/dL Final   Calcium 30/86/5784 9.1  8.9 - 10.3 mg/dL Final   Total Protein 69/62/9528 6.9  6.5 - 8.1 g/dL Final   Albumin 41/32/4401 3.7  3.5 - 5.0 g/dL Final   AST 02/72/5366 19  15 - 41 U/L Final   ALT 06/07/2022 22  0 - 44 U/L Final   Alkaline Phosphatase 06/07/2022 54  38 - 126 U/L Final   Total Bilirubin 06/07/2022 0.4  0.3 - 1.2 mg/dL Final   GFR, Estimated 06/07/2022 >60  >60 mL/min Final   Comment: (NOTE) Calculated using the CKD-EPI Creatinine Equation (2021)    Anion gap 06/07/2022 7  5 - 15 Final   Performed at [facility], [address], [place], [place] [address]   Hgb A1c MFr Bld 06/07/2022 5.0  4.8 - 5.6 % Final   Comment: (NOTE) Pre diabetes:          5.7%-6.4%  Diabetes:              >6.4%  Glycemic control for   <7.0% adults with diabetes    Mean Plasma Glucose 06/07/2022 96.8  mg/dL Final   Performed at [facility], [address], [place], [place] [address]   TSH 06/07/2022 1.620  0.350 - 4.500 uIU/mL Final  Comment: Performed by a 3rd Generation assay  with a functional sensitivity of <=0.01 uIU/mL. Performed at [facility], [address] [place], [place] [address]    RPR Ser Ql 06/07/2022 NON REACTIVE  NON REACTIVE Final   Performed at [facility], [address] [place], [place] [address]   Color, Urine 06/07/2022 YELLOW  YELLOW Final   APPearance 06/07/2022 HAZY (A)  CLEAR Final   Specific Gravity, Urine 06/07/2022 1.018  1.005 - 1.030 Final   pH 06/07/2022 7.0  5.0 - 8.0 Final   Glucose, UA 06/07/2022 NEGATIVE  NEGATIVE mg/dL Final   Hgb urine dipstick 06/07/2022 NEGATIVE  NEGATIVE Final   Bilirubin Urine 06/07/2022 NEGATIVE  NEGATIVE Final   Ketones, ur 06/07/2022 NEGATIVE  NEGATIVE mg/dL Final   Protein, ur 09/81/191408/25/2023 NEGATIVE  NEGATIVE mg/dL Final   Nitrite 78/29/562108/25/2023 NEGATIVE  NEGATIVE Final   Leukocytes,Ua 06/07/2022 NEGATIVE  NEGATIVE Final   Performed at [facility], [address] [place], [place] [address]   Cholesterol 06/07/2022 164  0 - 200 mg/dL Final   Triglycerides 78/46/962908/25/2023 158 (H)  <150 mg/dL Final   HDL 52/84/132408/25/2023 44  >40 mg/dL Final   Total CHOL/HDL Ratio 06/07/2022 3.7  RATIO Final   VLDL 06/07/2022 32  0 - 40 mg/dL Final   LDL Cholesterol 06/07/2022 88  0 - 99 mg/dL Final   Comment:        Total Cholesterol/HDL:CHD Risk Coronary Heart Disease Risk Table                     Men   Women  1/2 Average Risk   3.4   3.3  Average Risk       5.0   4.4  2 X Average Risk   9.6   7.1  3 X Average Risk  23.4   11.0        Use the calculated Patient Ratio above and the CHD Risk Table to determine the patient's CHD Risk.        ATP III CLASSIFICATION (LDL):  <100     mg/dL   Optimal  [id number]  mg/dL   Near or Above                    Optimal  130-159  mg/dL   Borderline  [id number]  mg/dL   High  >403>190     mg/dL   Very High Performed at [facility], [address] [place], [place] [address]    HIV Screen 4th Generation wRfx 06/07/2022 Non Reactive  Non Reactive Final   Performed  at [facility], [address] [place], [place] [address]   SARSCOV2ONAVIRUS 2 AG 06/07/2022 NEGATIVE  NEGATIVE Final   Comment: (NOTE) SARS-CoV-2 antigen NOT DETECTED.   Negative results are presumptive.  Negative results do not preclude SARS-CoV-2 infection and should not be used as the sole basis for treatment or other patient management decisions, including infection  control decisions, particularly in the presence of clinical signs and  symptoms consistent with COVID-19, or in those who have been in contact with the virus.  Negative results must be combined with clinical observations, patient history, and epidemiological information. The expected result is Negative.  Fact Sheet for Patients: https://www.jennings-kim.com/https://www.fda.gov/media/141569/download  Fact Sheet for Healthcare Providers: https://alexander-rogers.biz/https://www.fda.gov/media/141568/download  This test is not yet approved or cleared by the [place] FDA and  has been authorized for detection and/or diagnosis  of SARS-CoV-2 by FDA under an Emergency Use Authorization (EUA).  This EUA will remain in effect (meaning this test can be used) for the duration of  the COV                          ID-19 declaration under Section 564(b)(1) of the Act, 21 U.S.C. section 360bbb-3(b)(1), unless the authorization is terminated or revoked sooner.     POC Amphetamine UR 06/07/2022 None Detected  NONE DETECTED (Cut Off Level 1000 ng/mL) Final   POC Secobarbital (BAR) 06/07/2022 None Detected  NONE DETECTED (Cut Off Level 300 ng/mL) Final   POC Buprenorphine (BUP) 06/07/2022 None Detected  NONE DETECTED (Cut Off Level 10 ng/mL) Final   POC Oxazepam (BZO) 06/07/2022 None Detected  NONE DETECTED (Cut Off Level 300 ng/mL) Final   POC Cocaine UR 06/07/2022 None Detected  NONE DETECTED (Cut Off Level 300 ng/mL) Final   POC Methamphetamine UR 06/07/2022 None Detected  NONE DETECTED (Cut Off Level 1000 ng/mL) Final   POC Morphine 06/07/2022 None Detected  NONE DETECTED  (Cut Off Level 300 ng/mL) Final   POC Methadone UR 06/07/2022 None Detected  NONE DETECTED (Cut Off Level 300 ng/mL) Final   POC Oxycodone UR 06/07/2022 None Detected  NONE DETECTED (Cut Off Level 100 ng/mL) Final   POC Marijuana UR 06/07/2022 None Detected  NONE DETECTED (Cut Off Level 50 ng/mL) Final   Preg Test, Ur 06/08/2022 NEGATIVE  NEGATIVE Final   Comment:        THE SENSITIVITY OF THIS METHODOLOGY IS >20 mIU/mL. Performed at [facility], [address], [place], [place] [address]    Preg Test, Ur 06/08/2022 NEGATIVE  NEGATIVE Final   Comment:        THE SENSITIVITY OF THIS METHODOLOGY IS >24 mIU/mL     Blood Alcohol level:  Lab Results  Component Value Date   ETH <10 07/04/2022   ETH <10 02/07/2022    Metabolic Disorder Labs: Lab Results  Component Value Date   HGBA1C 5.0 06/07/2022   MPG 96.8 06/07/2022   MPG 96.8 02/07/2022   No results found for: "PROLACTIN" Lab Results  Component Value Date   CHOL 181 07/25/2022   TRIG 118 07/25/2022   HDL 45 07/25/2022   CHOLHDL 4.0 07/25/2022   VLDL 24 07/25/2022   LDLCALC 112 (H) 07/25/2022   LDLCALC 88 06/07/2022    Therapeutic Lab Levels: No results found for: "LITHIUM" No results found for: "VALPROATE" No results found for: "CBMZ"  Physical Findings   GAD-7    Flowsheet Row [facility] Visit [facility]/17/2021 [facility]  Total GAD-7 Score 17      PHQ2-9    Flowsheet Row ED from 06/07/2022 in [facility] Office Visit from 12/17/2021 [facility]  PHQ-2 Total Score 1 2  PHQ-9 Total Score 2 8      Flowsheet Row ED from 10/07/2022 in [facility] Most recent reading at 10/07/2022  8:16 PM ED from 10/07/2022 in [facility] EMERGENCY DEPARTMENT Most recent reading at 10/07/2022 12:18 PM ED from 07/25/2022 in [facility] Most recent reading at  10/06/2022  9:38 AM  C-SSRS RISK CATEGORY No Risk No Risk No Risk       Psychiatric Specialty Exam  Presentation  General Appearance:  Appropriate for Environment; Casual; Fairly Groomed  Eye  Contact: Good  Speech: Clear and Coherent; Normal Rate  Speech Volume: Normal  Handedness: Right   Mood and Affect  Mood: Euthymic  Affect: Appropriate; Congruent; Full Range   Thought Process  Thought Processes: Coherent; Goal Directed; Linear  Descriptions of Associations:Intact  Orientation:Full (Time, Place and Person)  Thought Content:Logical; WDL  Diagnosis of Schizophrenia or Schizoaffective disorder in past: Yes    Hallucinations:Hallucinations: None  Ideas of Reference:None  Suicidal Thoughts:Suicidal Thoughts: No  Homicidal Thoughts:Homicidal Thoughts: No   Sensorium  Memory: Immediate Good  Judgment: Fair  Insight: Fair   Executive Functions  Concentration: Good  Attention Span: Good  Recall: Good  Fund of Knowledge: Good  Language: Good   Psychomotor Activity  Psychomotor Activity: Psychomotor Activity: Normal   Assets  Assets: Communication Skills; Desire for Improvement   Sleep  Sleep: Sleep: Good  Physical Exam  Physical Exam Vitals and nursing note reviewed.  Constitutional:      General: She is not in acute distress.    Appearance: Normal appearance. She is not ill-appearing, toxic-appearing or diaphoretic.  HENT:     Head: Normocephalic and atraumatic.  Eyes:     Extraocular Movements: Extraocular movements intact.  Cardiovascular:     Rate and Rhythm: Normal rate.  Pulmonary:     Effort: Pulmonary effort is normal. No respiratory distress.  Neurological:     General: No focal deficit present.     Mental Status: She is alert and oriented to person, place, and time.    Review of Systems  Respiratory:  Negative for shortness of breath.   Cardiovascular:  Negative for chest pain.  Gastrointestinal:   Positive for constipation. Negative for abdominal pain, diarrhea, nausea and vomiting.  Musculoskeletal:  Negative for back pain, myalgias and neck pain.  Neurological:  Negative for dizziness and headaches.  Psychiatric/Behavioral: Negative.     Blood pressure 120/86, pulse (!) 101, temperature 98.2 F (36.8 C), temperature source Oral, resp. rate 18, height 5\' 1"  (1.549 m), weight 198 lb (89.8 kg), SpO2 100 %. Body mass index is 37.41 kg/m.  Treatment Plan Summary: Continue collaborative efforts to secure appropriate residential facility placement for patient with [facility]/[employer]. Patient remains psych cleared.    Merrily Brittle, DO-PGY2 10/14/2022 10:05 AM

## 2022-10-14 NOTE — Progress Notes (Signed)
Received Nicole [name] this Am asleep in her chair bed, she was awaken for medications, compliant. She denied all of the psychiatric symptoms including feeling suicidal.

## 2022-10-14 NOTE — Progress Notes (Signed)
[name] remained in the milieu throughout the morning. She napped, showered, watched TV and was social with her peers and the staff. Zshe did not verbalized any complaints.

## 2022-10-14 NOTE — ED Notes (Signed)
Pt awake. A/O x 4 Denies SI/HI/AVH. C/o upset stomach earlier, but is better now. Requesting broth and saltines. Pleasant, calm, cooperative. No resp distress noted. Will continue to monitor for safety

## 2022-10-15 DIAGNOSIS — N390 Urinary tract infection, site not specified: Secondary | ICD-10-CM | POA: Diagnosis not present

## 2022-10-15 DIAGNOSIS — F209 Schizophrenia, unspecified: Secondary | ICD-10-CM | POA: Diagnosis not present

## 2022-10-15 DIAGNOSIS — F419 Anxiety disorder, unspecified: Secondary | ICD-10-CM | POA: Diagnosis not present

## 2022-10-15 DIAGNOSIS — E118 Type 2 diabetes mellitus with unspecified complications: Secondary | ICD-10-CM | POA: Diagnosis not present

## 2022-10-15 DIAGNOSIS — N9489 Other specified conditions associated with female genital organs and menstrual cycle: Secondary | ICD-10-CM | POA: Diagnosis not present

## 2022-10-15 DIAGNOSIS — Z1152 Encounter for screening for COVID-19: Secondary | ICD-10-CM | POA: Diagnosis not present

## 2022-10-15 DIAGNOSIS — F17200 Nicotine dependence, unspecified, uncomplicated: Secondary | ICD-10-CM | POA: Diagnosis not present

## 2022-10-15 DIAGNOSIS — F333 Major depressive disorder, recurrent, severe with psychotic symptoms: Secondary | ICD-10-CM | POA: Diagnosis not present

## 2022-10-15 DIAGNOSIS — E039 Hypothyroidism, unspecified: Secondary | ICD-10-CM | POA: Diagnosis not present

## 2022-10-15 LAB — HEMOGLOBIN A1C
Hgb A1c MFr Bld: 5.7 % — ABNORMAL HIGH (ref 4.8–5.6)
Mean Plasma Glucose: 117 mg/dL

## 2022-10-15 MED ORDER — CARBAMIDE PEROXIDE 6.5 % OT SOLN
5.0000 [drp] | Freq: Once | OTIC | Status: AC
  Administered 2022-10-15: 5 [drp] via OTIC
  Filled 2022-10-15: qty 15

## 2022-10-15 MED ORDER — FLUTICASONE PROPIONATE 50 MCG/ACT NA SUSP
1.0000 | Freq: Every day | NASAL | Status: DC
  Administered 2022-10-15 – 2022-11-10 (×26): 1 via NASAL
  Filled 2022-10-15: qty 16

## 2022-10-15 NOTE — Progress Notes (Signed)
[name] Progress Note  2019 - [name] left a HIPAA compliant message for Mr. Marcy Salvo requesting a call back for an update regarding the PCP signature page.  [name] was informed there would be a treatment team meeting for her tomorrow, but there had been no update so far regarding if the signature page had been signed by her mother despite multiple attempts to contact the father for the information.   Omelia Blackwater, MSW, [place] phone [phone] fax

## 2022-10-15 NOTE — ED Notes (Signed)
Pt sleeping, resp even and unlabored. Will continue to monitor for safety 

## 2022-10-15 NOTE — ED Notes (Signed)
Pt is complaining of bilat ear pain.  Provider made aware.

## 2022-10-15 NOTE — ED Notes (Addendum)
Pt A&O x 4, no distress noted, calm & cooperative, pt very needy requesting to use the phone to call family.  Monitoring for safety.

## 2022-10-15 NOTE — ED Notes (Signed)
Pt resting quietly at this time. Breathing even and unlabored.  Staff will continue to monitor for safety.

## 2022-10-15 NOTE — ED Provider Notes (Signed)
Behavioral Health Progress Note  Date and Time: 10/15/2022 10:23 AM Name: Nicole [name] MRN:  [id number]  Diagnosis:  Final diagnoses:  Tobacco use disorder  Schizophrenia, unspecified type (HCC)  MDD (major depressive disorder), recurrent episode, moderate (HCC)  Hypothyroidism, unspecified type    Total Time spent with patient: 20 minutes  Brief HPI: Nicole MassonLatasha Heggie 32 y.o., female patient with schizophrenia, MDD with psychosis, hypothyroidism, tobacco use d/o, presented to [facility], voluntarily, as a walk-in accompanied by [employer] after a disagreement and verbal altercation with personnel at the group home.  She has been dismissed previously from other group homes due to n which she was residing in.  Patient was admitted to continuous assessment unit on 07/25/2022 as she also endorsed passive SI at that time along with symptoms of visual hallucination. Patient was subsequently psychiatrically cleared during her admission to the continuous assessment unit.  However patient remains [facility] at [facility] bordering until adequate- residential placement can be obtained.    Legal Guardian: [name](Maxine Brown) transitioning to be [name](Albert Brown) Point of contact: [name](Albert Brown)  Tests administered: Wechsler Adult Intelligence Scale-4 (see Jolene ProvostMark Lewis, PhD consult note on 08/19/2022 for full info) [place]-3 Adaptive Behavior Scales Assessment completed on 09/13/2022 administered by Lamar Sprinklesourtney Cosby, MD  Subjective: During evaluation Nicole MassonLatasha Sefcik was initially seen ambulating around the unit, steady gait, no acute distress.  Patient was pleasant, cooperative, and engaged with evaluation.  Of note, prior to evaluation, [employer] presented to the patient to serve papers about change in motion about patient's guardianship from mother to father. Patient reported feeling good, denied side effects to medication and tolerating them well.  Denied any somatic concerns.  Reported sleep and appetite are stable and  appropriate.  She has no questions or concerns at this time.  Reported that she is still mildly constipated, however still able to have bowel movements now with MiraLAX.  She denied SI/HI/AVH, paranoia, contracted to safety and is amenable to plan per below.  Past Medical History:  Past Medical History:  Diagnosis Date   Anxiety    Depression    Hypothyroidism 08/07/2022   Tobacco use disorder 08/07/2022    Past Surgical History:  Procedure Laterality Date   WISDOM TOOTH EXTRACTION Bilateral 2020   Family History:  Family History  Problem Relation Age of Onset   Hypertension Father    Diabetes Father     Social History:  Social History   Substance and Sexual Activity  Alcohol Use Never     Social History   Substance and Sexual Activity  Drug Use Never    Social History   Socioeconomic History   Marital status: Single    Spouse name: Not on file   Number of children: Not on file   Years of education: Not on file   Highest education level: Not on file  Occupational History   Not on file  Tobacco Use   Smoking status: Every Day    Types: Cigarettes   Smokeless tobacco: Never  Vaping Use   Vaping Use: Some days  Substance and Sexual Activity   Alcohol use: Never   Drug use: Never   Sexual activity: Yes    Partners: Female    Birth control/protection: Implant  Other Topics Concern   Not on file  Social History Narrative   Not on file   Social Determinants of Health   Financial Resource Strain: Not on file  Food Insecurity: Not on file  Transportation Needs: Not on  file  Physical Activity: Not on file  Stress: Not on file  Social Connections: Not on file   SDOH:  SDOH Screenings   Depression (PHQ2-9): Low Risk  (06/10/2022)  Tobacco Use: High Risk (10/13/2022)   Additional Social History:            Current Medications:  Current Facility-Administered Medications  Medication Dose Route Frequency Provider Last Rate Last Admin   acetaminophen  (TYLENOL) tablet 650 mg  650 mg Oral Q6H PRN Marlou Sa, NP       alum & mag hydroxide-simeth (MAALOX/MYLANTA) 200-200-20 MG/5ML suspension 30 mL  30 mL Oral Q4H PRN Rayburn Go, Veronique M, NP   30 mL at 10/10/22 1659   cetaphil lotion   Topical PRN Princess Bruins, DO       hydrOXYzine (ATARAX) tablet 25 mg  25 mg Oral TID PRN Lorri Frederick, MD   25 mg at 10/12/22 2140   levothyroxine (SYNTHROID) tablet 100 mcg  100 mcg Oral Once Byungura, Veronique M, NP       levothyroxine (SYNTHROID) tablet 100 mcg  100 mcg Oral Q0600 Marlou Sa, NP   100 mcg at 10/15/22 1610   loratadine (CLARITIN) tablet 10 mg  10 mg Oral Daily Carrion-Carrero, Margely, MD   10 mg at 10/15/22 0900   magnesium hydroxide (MILK OF MAGNESIA) suspension 30 mL  30 mL Oral Daily PRN Rayburn Go, Veronique M, NP   30 mL at 10/12/22 2145   metFORMIN (GLUCOPHAGE) tablet 500 mg  500 mg Oral Q breakfast Carrion-Carrero, Karle Starch, MD   500 mg at 10/15/22 0857   nicotine (NICODERM CQ - dosed in mg/24 hours) patch 14 mg  14 mg Transdermal Daily PRN Carrion-Carrero, Karle Starch, MD       ondansetron (ZOFRAN-ODT) disintegrating tablet 4 mg  4 mg Oral Q8H PRN Carrion-Carrero, Margely, MD   4 mg at 10/10/22 1312   Oxcarbazepine (TRILEPTAL) tablet 300 mg  300 mg Oral BID Byungura, Veronique M, NP   300 mg at 10/15/22 0900   pantoprazole (PROTONIX) EC tablet 40 mg  40 mg Oral Daily Carrion-Carrero, Margely, MD   40 mg at 10/15/22 0900   polyethylene glycol (MIRALAX / GLYCOLAX) packet 17 g  17 g Oral Daily Princess Bruins, DO   17 g at 10/15/22 0900   QUEtiapine (SEROQUEL) tablet 400 mg  400 mg Oral BID Byungura, Veronique M, NP   400 mg at 10/15/22 0900   sertraline (ZOLOFT) tablet 150 mg  150 mg Oral Daily Carrion-Carrero, Margely, MD   150 mg at 10/15/22 0900   traZODone (DESYREL) tablet 100 mg  100 mg Oral QHS Olin Pia M, NP   100 mg at 10/14/22 2131   traZODone (DESYREL) tablet 50 mg  50 mg Oral QHS PRN Marlou Sa, NP   50 mg at 10/14/22 2127   valACYclovir (VALTREX) tablet 500 mg  500 mg Oral Daily Carrion-Carrero, Karle Starch, MD   500 mg at 10/15/22 0900   Current Outpatient Medications  Medication Sig Dispense Refill   ABILIFY MAINTENA 400 MG PRSY prefilled syringe 400 mg every 28 (twenty-eight) days.     cetirizine (ZYRTEC) 10 MG tablet Take 10 mg by mouth daily.     cyclobenzaprine (FLEXERIL) 10 MG tablet Take 1 tablet (10 mg total) by mouth 2 (two) times daily as needed for muscle spasms. 20 tablet 0   fluticasone (FLONASE) 50 MCG/ACT nasal spray Place 1 spray into both nostrils daily.     meloxicam (MOBIC)  15 MG tablet Take 15 mg by mouth daily.     nitrofurantoin, macrocrystal-monohydrate, (MACROBID) 100 MG capsule Take 1 capsule (100 mg total) by mouth 2 (two) times daily. 10 capsule 0   ondansetron (ZOFRAN-ODT) 4 MG disintegrating tablet Take 1 tablet (4 mg total) by mouth every 8 (eight) hours as needed for nausea or vomiting. 20 tablet 0   Oxcarbazepine (TRILEPTAL) 300 MG tablet Take 1 tablet (300 mg total) by mouth 2 (two) times daily. 60 tablet 0   QUEtiapine (SEROQUEL) 400 MG tablet Take 1 tablet (400 mg total) by mouth 2 (two) times daily. 60 tablet 0   sertraline (ZOLOFT) 50 MG tablet Take 3 tablets (150 mg total) by mouth in the morning. 90 tablet 0   traZODone (DESYREL) 100 MG tablet Take 1 tablet (100 mg total) by mouth at bedtime. 30 tablet 0   valACYclovir (VALTREX) 500 MG tablet Take 500 mg by mouth daily.      Labs  Lab Results:  Admission on 10/07/2022, Discharged on 10/07/2022  Component Date Value Ref Range Status   Color, Urine 10/07/2022 YELLOW  YELLOW Final   APPearance 10/07/2022 HAZY (A)  CLEAR Final   Specific Gravity, Urine 10/07/2022 1.011  1.005 - 1.030 Final   pH 10/07/2022 7.0  5.0 - 8.0 Final   Glucose, UA 10/07/2022 NEGATIVE  NEGATIVE mg/dL Final   Hgb urine dipstick 10/07/2022 SMALL (A)  NEGATIVE Final   Bilirubin Urine 10/07/2022 NEGATIVE   NEGATIVE Final   Ketones, ur 10/07/2022 NEGATIVE  NEGATIVE mg/dL Final   Protein, ur 23/55/7322 NEGATIVE  NEGATIVE mg/dL Final   Nitrite 02/54/2706 NEGATIVE  NEGATIVE Final   Leukocytes,Ua 10/07/2022 LARGE (A)  NEGATIVE Final   RBC / HPF 10/07/2022 0-5  0 - 5 RBC/hpf Final   WBC, UA 10/07/2022 0-5  0 - 5 WBC/hpf Final   Bacteria, UA 10/07/2022 FEW (A)  NONE SEEN Final   Squamous Epithelial / LPF 10/07/2022 11-20  0 - 5 Final   Performed at [facility], [address] [place], [place] [address]   I-stat hCG, quantitative 10/07/2022 <5.0  <5 mIU/mL Final   Comment 3 10/07/2022          Final   Comment:   GEST. AGE      CONC.  (mIU/mL)   <=1 WEEK        5 - 50     2 WEEKS       50 - 500     3 WEEKS       100 - 10,000     4 WEEKS     1,000 - 30,000        FEMALE AND NON-PREGNANT FEMALE:     LESS THAN 5 mIU/mL    Sodium 10/07/2022 138  135 - 145 mmol/L Final   Potassium 10/07/2022 4.3  3.5 - 5.1 mmol/L Final   Chloride 10/07/2022 104  98 - 111 mmol/L Final   CO2 10/07/2022 28  22 - 32 mmol/L Final   Glucose, Bld 10/07/2022 90  70 - 99 mg/dL Final   Glucose reference range applies only to samples taken after fasting for at least 8 hours.   BUN 10/07/2022 8  6 - 20 mg/dL Final   Creatinine, Ser 10/07/2022 0.96  0.44 - 1.00 mg/dL Final   Calcium 83/15/1761 9.1  8.9 - 10.3 mg/dL Final   GFR, Estimated 10/07/2022 >60  >60 mL/min Final   Comment: (NOTE) Calculated using the CKD-EPI Creatinine  Equation (2021)    Anion gap 10/07/2022 6  5 - 15 Final   Performed at [facility], [address] [place], [place] [address]   WBC 10/07/2022 5.8  4.0 - 10.5 K/uL Final   RBC 10/07/2022 4.36  3.87 - 5.11 MIL/uL Final   Hemoglobin 10/07/2022 12.9  12.0 - 15.0 g/dL Final   HCT 60/45/409812/25/2023 40.0  36.0 - 46.0 % Final   MCV 10/07/2022 91.7  80.0 - 100.0 fL Final   MCH 10/07/2022 29.6  26.0 - 34.0 pg Final   MCHC 10/07/2022 32.3  30.0 - 36.0 g/dL Final   RDW 11/91/478212/25/2023 12.4  11.5 - 15.5 %  Final   Platelets 10/07/2022 295  150 - 400 K/uL Final   nRBC 10/07/2022 0.0  0.0 - 0.2 % Final   Performed at [facility], [address] [place], [place] [address]   SARS Coronavirus 2 by RT PCR 10/07/2022 NEGATIVE  NEGATIVE Final   Comment: (NOTE) SARS-CoV-2 target nucleic acids are NOT DETECTED.  The SARS-CoV-2 RNA is generally detectable in upper respiratory specimens during the acute phase of infection. The lowest concentration of SARS-CoV-2 viral copies this assay can detect is 138 copies/mL. A negative result does not preclude SARS-Cov-2 infection and should not be used as the sole basis for treatment or other patient management decisions. A negative result may occur with  improper specimen collection/handling, submission of specimen other than nasopharyngeal swab, presence of viral mutation(s) within the areas targeted by this assay, and inadequate number of viral copies(<138 copies/mL). A negative result must be combined with clinical observations, patient history, and epidemiological information. The expected result is Negative.  Fact Sheet for Patients:  BloggerCourse.comhttps://www.fda.gov/media/152166/download  Fact Sheet for Healthcare Providers:  SeriousBroker.ithttps://www.fda.gov/media/152162/download  This test is no                          t yet approved or cleared by the [place] FDA and  has been authorized for detection and/or diagnosis of SARS-CoV-2 by FDA under an Emergency Use Authorization (EUA). This EUA will remain  in effect (meaning this test can be used) for the duration of the COVID-19 declaration under Section 564(b)(1) of the Act, 21 U.S.C.section 360bbb-3(b)(1), unless the authorization is terminated  or revoked sooner.       Influenza A by PCR 10/07/2022 NEGATIVE  NEGATIVE Final   Influenza B by PCR 10/07/2022 NEGATIVE  NEGATIVE Final   Comment: (NOTE) The Xpert Xpress SARS-CoV-2/FLU/RSV plus assay is intended as an aid in the diagnosis of influenza from  Nasopharyngeal swab specimens and should not be used as a sole basis for treatment. Nasal washings and aspirates are unacceptable for Xpert Xpress SARS-CoV-2/FLU/RSV testing.  Fact Sheet for Patients: BloggerCourse.comhttps://www.fda.gov/media/152166/download  Fact Sheet for Healthcare Providers: SeriousBroker.ithttps://www.fda.gov/media/152162/download  This test is not yet approved or cleared by the [place] FDA and has been authorized for detection and/or diagnosis of SARS-CoV-2 by FDA under an Emergency Use Authorization (EUA). This EUA will remain in effect (meaning this test can be used) for the duration of the COVID-19 declaration under Section 564(b)(1) of the Act, 21 U.S.C. section 360bbb-3(b)(1), unless the authorization is terminated or revoked.     Resp Syncytial Virus by PCR 10/07/2022 NEGATIVE  NEGATIVE Final   Comment: (NOTE) Fact Sheet for Patients: BloggerCourse.comhttps://www.fda.gov/media/152166/download  Fact Sheet for Healthcare Providers: SeriousBroker.ithttps://www.fda.gov/media/152162/download  This test is not yet approved or cleared by the [place] FDA and has  been authorized for detection and/or diagnosis of SARS-CoV-2 by FDA under an Emergency Use Authorization (EUA). This EUA will remain in effect (meaning this test can be used) for the duration of the COVID-19 declaration under Section 564(b)(1) of the Act, 21 U.S.C. section 360bbb-3(b)(1), unless the authorization is terminated or revoked.  Performed at [facility], [address], [place], [place] [address]    Specimen Description 10/07/2022 URINE, CLEAN CATCH   Final   Special Requests 10/07/2022    Final                   Value:NONE Performed at [facility], [address] [place], [place] [address]    Culture 10/07/2022 MULTIPLE SPECIES PRESENT, SUGGEST RECOLLECTION (A)   Final   Report Status 10/07/2022 10/08/2022 FINAL   Final  Admission on 07/25/2022, Discharged on 10/07/2022  Component Date Value Ref Range Status   SARS  Coronavirus 2 by RT PCR 07/25/2022 NEGATIVE  NEGATIVE Final   Comment: (NOTE) SARS-CoV-2 target nucleic acids are NOT DETECTED.  The SARS-CoV-2 RNA is generally detectable in upper respiratory specimens during the acute phase of infection. The lowest concentration of SARS-CoV-2 viral copies this assay can detect is 138 copies/mL. A negative result does not preclude SARS-Cov-2 infection and should not be used as the sole basis for treatment or other patient management decisions. A negative result may occur with  improper specimen collection/handling, submission of specimen other than nasopharyngeal swab, presence of viral mutation(s) within the areas targeted by this assay, and inadequate number of viral copies(<138 copies/mL). A negative result must be combined with clinical observations, patient history, and epidemiological information. The expected result is Negative.  Fact Sheet for Patients:  BloggerCourse.com  Fact Sheet for Healthcare Providers:  SeriousBroker.it  This test is no                          t yet approved or cleared by the [place] FDA and  has been authorized for detection and/or diagnosis of SARS-CoV-2 by FDA under an Emergency Use Authorization (EUA). This EUA will remain  in effect (meaning this test can be used) for the duration of the COVID-19 declaration under Section 564(b)(1) of the Act, 21 U.S.C.section 360bbb-3(b)(1), unless the authorization is terminated  or revoked sooner.       Influenza A by PCR 07/25/2022 NEGATIVE  NEGATIVE Final   Influenza B by PCR 07/25/2022 NEGATIVE  NEGATIVE Final   Comment: (NOTE) The Xpert Xpress SARS-CoV-2/FLU/RSV plus assay is intended as an aid in the diagnosis of influenza from Nasopharyngeal swab specimens and should not be used as a sole basis for treatment. Nasal washings and aspirates are unacceptable for Xpert Xpress SARS-CoV-2/FLU/RSV testing.  Fact  Sheet for Patients: BloggerCourse.com  Fact Sheet for Healthcare Providers: SeriousBroker.it  This test is not yet approved or cleared by the [place] FDA and has been authorized for detection and/or diagnosis of SARS-CoV-2 by FDA under an Emergency Use Authorization (EUA). This EUA will remain in effect (meaning this test can be used) for the duration of the COVID-19 declaration under Section 564(b)(1) of the Act, 21 U.S.C. section 360bbb-3(b)(1), unless the authorization is terminated or revoked.  Performed at [facility], [address], [place], [place] [address]    WBC 07/25/2022 8.3  4.0 - 10.5 K/uL Final   RBC 07/25/2022 4.44  3.87 - 5.11 MIL/uL Final   Hemoglobin 07/25/2022 13.7  12.0 - 15.0 g/dL Final   HCT 16/07/9603 40.2  36.0 - 46.0 % Final   MCV 07/25/2022 90.5  80.0 - 100.0 fL Final   MCH 07/25/2022 30.9  26.0 - 34.0 pg Final   MCHC 07/25/2022 34.1  30.0 - 36.0 g/dL Final   RDW 54/06/8118 12.2  11.5 - 15.5 % Final   Platelets 07/25/2022 248  150 - 400 K/uL Final   nRBC 07/25/2022 0.0  0.0 - 0.2 % Final   Neutrophils Relative % 07/25/2022 43  % Final   Neutro Abs 07/25/2022 3.6  1.7 - 7.7 K/uL Final   Lymphocytes Relative 07/25/2022 52  % Final   Lymphs Abs 07/25/2022 4.2 (H)  0.7 - 4.0 K/uL Final   Monocytes Relative 07/25/2022 5  % Final   Monocytes Absolute 07/25/2022 0.4  0.1 - 1.0 K/uL Final   Eosinophils Relative 07/25/2022 0  % Final   Eosinophils Absolute 07/25/2022 0.0  0.0 - 0.5 K/uL Final   Basophils Relative 07/25/2022 0  % Final   Basophils Absolute 07/25/2022 0.0  0.0 - 0.1 K/uL Final   Immature Granulocytes 07/25/2022 0  % Final   Abs Immature Granulocytes 07/25/2022 0.02  0.00 - 0.07 K/uL Final   Performed at [facility], [address], [place], [place] [address]   Sodium 07/25/2022 138  135 - 145 mmol/L Final   Potassium 07/25/2022 4.0  3.5 - 5.1 mmol/L Final   Chloride  07/25/2022 104  98 - 111 mmol/L Final   CO2 07/25/2022 29  22 - 32 mmol/L Final   Glucose, Bld 07/25/2022 83  70 - 99 mg/dL Final   Glucose reference range applies only to samples taken after fasting for at least 8 hours.   BUN 07/25/2022 11  6 - 20 mg/dL Final   Creatinine, Ser 07/25/2022 0.97  0.44 - 1.00 mg/dL Final   Calcium 95/62/1308 9.2  8.9 - 10.3 mg/dL Final   Total Protein 65/78/4696 7.0  6.5 - 8.1 g/dL Final   Albumin 29/52/8413 3.8  3.5 - 5.0 g/dL Final   AST 24/40/1027 18  15 - 41 U/L Final   ALT 07/25/2022 22  0 - 44 U/L Final   Alkaline Phosphatase 07/25/2022 64  38 - 126 U/L Final   Total Bilirubin 07/25/2022 0.2 (L)  0.3 - 1.2 mg/dL Final   GFR, Estimated 07/25/2022 >60  >60 mL/min Final   Comment: (NOTE) Calculated using the CKD-EPI Creatinine Equation (2021)    Anion gap 07/25/2022 5  5 - 15 Final   Performed at [facility], [address], [place], [place] [address]   POC Amphetamine UR 07/25/2022 None Detected  NONE DETECTED (Cut Off Level 1000 ng/mL) Preliminary   POC Secobarbital (BAR) 07/25/2022 None Detected  NONE DETECTED (Cut Off Level 300 ng/mL) Preliminary   POC Buprenorphine (BUP) 07/25/2022 None Detected  NONE DETECTED (Cut Off Level 10 ng/mL) Preliminary   POC Oxazepam (BZO) 07/25/2022 None Detected  NONE DETECTED (Cut Off Level 300 ng/mL) Preliminary   POC Cocaine UR 07/25/2022 None Detected  NONE DETECTED (Cut Off Level 300 ng/mL) Preliminary   POC Methamphetamine UR 07/25/2022 None Detected  NONE DETECTED (Cut Off Level 1000 ng/mL) Preliminary   POC Morphine 07/25/2022 None Detected  NONE DETECTED (Cut Off Level 300 ng/mL) Preliminary   POC Methadone UR 07/25/2022 None Detected  NONE DETECTED (Cut Off Level 300 ng/mL) Preliminary   POC Oxycodone UR 07/25/2022 None Detected  NONE DETECTED (Cut Off  Level 100 ng/mL) Preliminary   POC Marijuana UR 07/25/2022 None Detected  NONE DETECTED (Cut Off Level 50 ng/mL) Preliminary   SARSCOV2ONAVIRUS 2 AG  07/25/2022 NEGATIVE  NEGATIVE Final   Comment: (NOTE) SARS-CoV-2 antigen NOT DETECTED.   Negative results are presumptive.  Negative results do not preclude SARS-CoV-2 infection and should not be used as the sole basis for treatment or other patient management decisions, including infection  control decisions, particularly in the presence of clinical signs and  symptoms consistent with COVID-19, or in those who have been in contact with the virus.  Negative results must be combined with clinical observations, patient history, and epidemiological information. The expected result is Negative.  Fact Sheet for Patients: HandmadeRecipes.com.cy  Fact Sheet for Healthcare Providers: FuneralLife.at  This test is not yet approved or cleared by the [place] FDA and  has been authorized for detection and/or diagnosis of SARS-CoV-2 by FDA under an Emergency Use Authorization (EUA).  This EUA will remain in effect (meaning this test can be used) for the duration of  the COV                          ID-19 declaration under Section 564(b)(1) of the Act, 21 U.S.C. section 360bbb-3(b)(1), unless the authorization is terminated or revoked sooner.     Cholesterol 07/25/2022 181  0 - 200 mg/dL Final   Triglycerides 07/25/2022 118  <150 mg/dL Final   HDL 07/25/2022 45  >40 mg/dL Final   Total CHOL/HDL Ratio 07/25/2022 4.0  RATIO Final   VLDL 07/25/2022 24  0 - 40 mg/dL Final   LDL Cholesterol 07/25/2022 112 (H)  0 - 99 mg/dL Final   Comment:        Total Cholesterol/HDL:CHD Risk Coronary Heart Disease Risk Table                     Men   Women  1/2 Average Risk   3.4   3.3  Average Risk       5.0   4.4  2 X Average Risk   9.6   7.1  3 X Average Risk  23.4   11.0        Use the calculated Patient Ratio above and the CHD Risk Table to determine the patient's CHD Risk.        ATP III CLASSIFICATION (LDL):  <100     mg/dL   Optimal  100-129   mg/dL   Near or Above                    Optimal  130-159  mg/dL   Borderline  160-189  mg/dL   High  >190     mg/dL   Very High Performed at [facility] [address] [place], [place] [address]    TSH 07/25/2022 6.668 (H)  0.350 - 4.500 uIU/mL Final   Comment: Performed by a 3rd Generation assay with a functional sensitivity of <=0.01 uIU/mL. Performed at [facility], [place] [address], [place], [place] [address]    Glucose-Capillary 07/26/2022 104 (H)  70 - 99 mg/dL Final   Glucose reference range applies only to samples taken after fasting for at least 8 hours.   T3, Free 07/31/2022 2.3  2.0 - 4.4 pg/mL Final   Comment: (NOTE) Performed At: [facility] [place], [place] [id number] Rush Farmer MD IO:[id number]    Free  T4 07/31/2022 0.60 (L)  0.61 - 1.12 ng/dL Final   Comment: (NOTE) Biotin ingestion may interfere with free T4 tests. If the results are inconsistent with the TSH level, previous test results, or the clinical presentation, then consider biotin interference. If needed, order repeat testing after stopping biotin. Performed at [facility], [address] [place], [place] [address]    Glucose-Capillary 08/29/2022 100 (H)  70 - 99 mg/dL Final   Glucose reference range applies only to samples taken after fasting for at least 8 hours.   TSH 09/05/2022 0.793  0.350 - 4.500 uIU/mL Final   Comment: Performed by a 3rd Generation assay with a functional sensitivity of <=0.01 uIU/mL. Performed at [facility], [address], [place], [place] [address]    Free T4 09/05/2022 0.73  0.61 - 1.12 ng/dL Final   Comment: (NOTE) Biotin ingestion may interfere with free T4 tests. If the results are inconsistent with the TSH level, previous test results, or the clinical presentation, then consider biotin interference. If needed, order repeat testing after stopping biotin. Performed at [facility], [address],  [place], [place] [address]    Preg Test, Ur 09/06/2022 Negative  Negative Final   Preg Test, Ur 09/06/2022 NEGATIVE  NEGATIVE Final   Comment:        THE SENSITIVITY OF THIS METHODOLOGY IS >24 mIU/mL    Preg Test, Ur 09/05/2022 NEGATIVE  NEGATIVE Final   Comment:        THE SENSITIVITY OF THIS METHODOLOGY IS >24 mIU/mL    Sodium 09/13/2022 136  135 - 145 mmol/L Final   Potassium 09/13/2022 4.5  3.5 - 5.1 mmol/L Final   Chloride 09/13/2022 105  98 - 111 mmol/L Final   CO2 09/13/2022 21 (L)  22 - 32 mmol/L Final   Glucose, Bld 09/13/2022 111 (H)  70 - 99 mg/dL Final   Glucose reference range applies only to samples taken after fasting for at least 8 hours.   BUN 09/13/2022 14  6 - 20 mg/dL Final   Creatinine, Ser 09/13/2022 0.92  0.44 - 1.00 mg/dL Final   Calcium 91/47/8295 9.1  8.9 - 10.3 mg/dL Final   GFR, Estimated 09/13/2022 >60  >60 mL/min Final   Comment: (NOTE) Calculated using the CKD-EPI Creatinine Equation (2021)    Anion gap 09/13/2022 10  5 - 15 Final   Performed at [facility], [address] [place], [place] [address]   Vitamin B-12 09/13/2022 585  180 - 914 pg/mL Final   Comment: (NOTE) This assay is not validated for testing neonatal or myeloproliferative syndrome specimens for Vitamin B12 levels. Performed at [facility], [address], [place], [place] [address]    Color, Urine 09/14/2022 YELLOW  YELLOW Final   APPearance 09/14/2022 HAZY (A)  CLEAR Final   Specific Gravity, Urine 09/14/2022 1.025  1.005 - 1.030 Final   pH 09/14/2022 5.0  5.0 - 8.0 Final   Glucose, UA 09/14/2022 NEGATIVE  NEGATIVE mg/dL Final   Hgb urine dipstick 09/14/2022 NEGATIVE  NEGATIVE Final   Bilirubin Urine 09/14/2022 NEGATIVE  NEGATIVE Final   Ketones, ur 09/14/2022 NEGATIVE  NEGATIVE mg/dL Final   Protein, ur 46/96/2952 NEGATIVE  NEGATIVE mg/dL Final   Nitrite 84/13/2440 NEGATIVE  NEGATIVE Final   Leukocytes,Ua 09/14/2022 TRACE (A)  NEGATIVE Final   RBC / HPF  09/14/2022 0-5  0 - 5 RBC/hpf Final   WBC, UA 09/14/2022 0-5  0 -  5 WBC/hpf Final   Bacteria, UA 09/14/2022 RARE (A)  NONE SEEN Final   Squamous Epithelial / LPF 09/14/2022 0-5  0 - 5 Final   Mucus 09/14/2022 PRESENT   Final   Performed at [facility], [address], [place], [place] [address]   Glucose-Capillary 09/16/2022 105 (H)  70 - 99 mg/dL Final   Glucose reference range applies only to samples taken after fasting for at least 8 hours.   SARS Coronavirus 2 by RT PCR 09/30/2022 NEGATIVE  NEGATIVE Final   Comment: (NOTE) SARS-CoV-2 target nucleic acids are NOT DETECTED.  The SARS-CoV-2 RNA is generally detectable in upper respiratory specimens during the acute phase of infection. The lowest concentration of SARS-CoV-2 viral copies this assay can detect is 138 copies/mL. A negative result does not preclude SARS-Cov-2 infection and should not be used as the sole basis for treatment or other patient management decisions. A negative result may occur with  improper specimen collection/handling, submission of specimen other than nasopharyngeal swab, presence of viral mutation(s) within the areas targeted by this assay, and inadequate number of viral copies(<138 copies/mL). A negative result must be combined with clinical observations, patient history, and epidemiological information. The expected result is Negative.  Fact Sheet for Patients:  BloggerCourse.com  Fact Sheet for Healthcare Providers:  SeriousBroker.it  This test is no                          t yet approved or cleared by the [place] FDA and  has been authorized for detection and/or diagnosis of SARS-CoV-2 by FDA under an Emergency Use Authorization (EUA). This EUA will remain  in effect (meaning this test can be used) for the duration of the COVID-19 declaration under Section 564(b)(1) of the Act, 21 U.S.C.section 360bbb-3(b)(1), unless the authorization  is terminated  or revoked sooner.       Influenza A by PCR 09/30/2022 NEGATIVE  NEGATIVE Final   Influenza B by PCR 09/30/2022 NEGATIVE  NEGATIVE Final   Comment: (NOTE) The Xpert Xpress SARS-CoV-2/FLU/RSV plus assay is intended as an aid in the diagnosis of influenza from Nasopharyngeal swab specimens and should not be used as a sole basis for treatment. Nasal washings and aspirates are unacceptable for Xpert Xpress SARS-CoV-2/FLU/RSV testing.  Fact Sheet for Patients: BloggerCourse.com  Fact Sheet for Healthcare Providers: SeriousBroker.it  This test is not yet approved or cleared by the [place] FDA and has been authorized for detection and/or diagnosis of SARS-CoV-2 by FDA under an Emergency Use Authorization (EUA). This EUA will remain in effect (meaning this test can be used) for the duration of the COVID-19 declaration under Section 564(b)(1) of the Act, 21 U.S.C. section 360bbb-3(b)(1), unless the authorization is terminated or revoked.     Resp Syncytial Virus by PCR 09/30/2022 NEGATIVE  NEGATIVE Final   Comment: (NOTE) Fact Sheet for Patients: BloggerCourse.com  Fact Sheet for Healthcare Providers: SeriousBroker.it  This test is not yet approved or cleared by the [place] FDA and has been authorized for detection and/or diagnosis of SARS-CoV-2 by FDA under an Emergency Use Authorization (EUA). This EUA will remain in effect (meaning this test can be used) for the duration of the COVID-19 declaration under Section 564(b)(1) of the Act, 21 U.S.C. section 360bbb-3(b)(1), unless the authorization is terminated or revoked.  Performed at [facility], [address], [place], [place] [address]    Sodium 10/01/2022 136  135 - 145  mmol/L Final   Potassium 10/01/2022 3.8  3.5 - 5.1 mmol/L Final   Chloride 10/01/2022 104  98 - 111 mmol/L Final   CO2  10/01/2022 27  22 - 32 mmol/L Final   Glucose, Bld 10/01/2022 98  70 - 99 mg/dL Final   Glucose reference range applies only to samples taken after fasting for at least 8 hours.   BUN 10/01/2022 12  6 - 20 mg/dL Final   Creatinine, Ser 10/01/2022 0.98  0.44 - 1.00 mg/dL Final   Calcium 21/30/8657 8.9  8.9 - 10.3 mg/dL Final   GFR, Estimated 10/01/2022 >60  >60 mL/min Final   Comment: (NOTE) Calculated using the CKD-EPI Creatinine Equation (2021)    Anion gap 10/01/2022 5  5 - 15 Final   Performed at [facility], [address], [place], [place] [address]   WBC 10/01/2022 5.1  4.0 - 10.5 K/uL Final   RBC 10/01/2022 4.31  3.87 - 5.11 MIL/uL Final   Hemoglobin 10/01/2022 12.7  12.0 - 15.0 g/dL Final   HCT 29/52/8413 38.8  36.0 - 46.0 % Final   MCV 10/01/2022 90.0  80.0 - 100.0 fL Final   MCH 10/01/2022 29.5  26.0 - 34.0 pg Final   MCHC 10/01/2022 32.7  30.0 - 36.0 g/dL Final   RDW 24/40/1027 12.4  11.5 - 15.5 % Final   Platelets 10/01/2022 300  150 - 400 K/uL Final   nRBC 10/01/2022 0.0  0.0 - 0.2 % Final   Performed at [facility], [address] [place], [place] [address]   Lipase 10/01/2022 29  11 - 51 U/L Final   Performed at [facility], [address] [place], [place] [address]   Color, Urine 10/05/2022 YELLOW  YELLOW Final   APPearance 10/05/2022 HAZY (A)  CLEAR Final   Specific Gravity, Urine 10/05/2022 1.018  1.005 - 1.030 Final   pH 10/05/2022 5.0  5.0 - 8.0 Final   Glucose, UA 10/05/2022 NEGATIVE  NEGATIVE mg/dL Final   Hgb urine dipstick 10/05/2022 NEGATIVE  NEGATIVE Final   Bilirubin Urine 10/05/2022 NEGATIVE  NEGATIVE Final   Ketones, ur 10/05/2022 NEGATIVE  NEGATIVE mg/dL Final   Protein, ur 74/25/9563 NEGATIVE  NEGATIVE mg/dL Final   Nitrite 87/56/4332 NEGATIVE  NEGATIVE Final   Leukocytes,Ua 10/05/2022 TRACE (A)  NEGATIVE Final   RBC / HPF 10/05/2022 0-5  0 - 5 RBC/hpf Final   WBC, UA 10/05/2022 0-5  0 - 5 WBC/hpf Final   Bacteria, UA  10/05/2022 RARE (A)  NONE SEEN Final   Squamous Epithelial / LPF 10/05/2022 0-5  0 - 5 Final   Mucus 10/05/2022 PRESENT   Final   Performed at [facility], [address], [place], [place] [address]   Specimen Description 10/05/2022 URINE, CLEAN CATCH   Final   Special Requests 10/05/2022    Final                   Value:NONE Performed at [facility], [address], [place], [place] [address]    Culture 10/05/2022 10,000 COLONIES/mL MULTIPLE SPECIES PRESENT, SUGGEST RECOLLECTION (A)   Final   Report Status 10/05/2022 10/07/2022 FINAL   Final  Admission on 07/04/2022, Discharged on 07/05/2022  Component Date Value Ref Range Status   Sodium 07/04/2022 142  135 - 145 mmol/L Final   Potassium 07/04/2022 4.4  3.5 - 5.1 mmol/L Final   Chloride 07/04/2022 109  98 - 111  mmol/L Final   CO2 07/04/2022 26  22 - 32 mmol/L Final   Glucose, Bld 07/04/2022 96  70 - 99 mg/dL Final   Glucose reference range applies only to samples taken after fasting for at least 8 hours.   BUN 07/04/2022 15  6 - 20 mg/dL Final   Creatinine, Ser 07/04/2022 0.83  0.44 - 1.00 mg/dL Final   Calcium 38/07/1750 9.3  8.9 - 10.3 mg/dL Final   Total Protein 02/58/5277 7.2  6.5 - 8.1 g/dL Final   Albumin 82/42/3536 3.9  3.5 - 5.0 g/dL Final   AST 14/43/1540 23  15 - 41 U/L Final   ALT 07/04/2022 28  0 - 44 U/L Final   Alkaline Phosphatase 07/04/2022 77  38 - 126 U/L Final   Total Bilirubin 07/04/2022 0.4  0.3 - 1.2 mg/dL Final   GFR, Estimated 07/04/2022 >60  >60 mL/min Final   Comment: (NOTE) Calculated using the CKD-EPI Creatinine Equation (2021)    Anion gap 07/04/2022 7  5 - 15 Final   Performed at [facility], [address], [place], [place] [address]   Alcohol, Ethyl (B) 07/04/2022 <10  <10 mg/dL Final   Comment: (NOTE) Lowest detectable limit for serum alcohol is 10 mg/dL.  For medical purposes only. Performed at [facility], [address], [place], [place] [address]    WBC 07/04/2022 7.0  4.0 - 10.5 K/uL Final   RBC 07/04/2022 4.18  3.87 - 5.11 MIL/uL Final   Hemoglobin 07/04/2022 12.8  12.0 - 15.0 g/dL Final   HCT 32/67/1245 39.1  36.0 - 46.0 % Final   MCV 07/04/2022 93.5  80.0 - 100.0 fL Final   MCH 07/04/2022 30.6  26.0 - 34.0 pg Final   MCHC 07/04/2022 32.7  30.0 - 36.0 g/dL Final   RDW 80/99/8338 12.9  11.5 - 15.5 % Final   Platelets 07/04/2022 243  150 - 400 K/uL Final   nRBC 07/04/2022 0.0  0.0 - 0.2 % Final   Neutrophils Relative % 07/04/2022 43  % Final   Neutro Abs 07/04/2022 3.0  1.7 - 7.7 K/uL Final   Lymphocytes Relative 07/04/2022 50  % Final   Lymphs Abs 07/04/2022 3.5  0.7 - 4.0 K/uL Final   Monocytes Relative 07/04/2022 7  % Final   Monocytes Absolute 07/04/2022 0.5  0.1 - 1.0 K/uL Final   Eosinophils Relative 07/04/2022 0  % Final   Eosinophils Absolute 07/04/2022 0.0  0.0 - 0.5 K/uL Final   Basophils Relative 07/04/2022 0  % Final   Basophils Absolute 07/04/2022 0.0  0.0 - 0.1 K/uL Final   Immature Granulocytes 07/04/2022 0  % Final   Abs Immature Granulocytes 07/04/2022 0.01  0.00 - 0.07 K/uL Final   Performed at [facility], [address], [place], [place] [address]   I-stat hCG, quantitative 07/04/2022 <5.0  <5 mIU/mL Final   Comment 3 07/04/2022          Final   Comment:   GEST. AGE      CONC.  (mIU/mL)   <=1 WEEK        5 - 50     2 WEEKS       50 - 500     3 WEEKS       100 - 10,000     4 WEEKS     1,000 - 30,000        FEMALE AND NON-PREGNANT FEMALE:  LESS THAN 5 mIU/mL   Admission on 06/14/2022, Discharged on 06/14/2022  Component Date Value Ref Range Status   Color, UA 06/14/2022 yellow  yellow Final   Clarity, UA 06/14/2022 cloudy (A)  clear Final   Glucose, UA 06/14/2022 negative  negative mg/dL Final   Bilirubin, UA 06/14/2022 negative  negative Final   Ketones, POC UA 06/14/2022 negative  negative mg/dL Final   Spec Grav, UA 06/14/2022 1.025  1.010 -  1.025 Final   Blood, UA 06/14/2022 negative  negative Final   pH, UA 06/14/2022 7.5  5.0 - 8.0 Final   Protein Ur, POC 06/14/2022 =30 (A)  negative mg/dL Final   Urobilinogen, UA 06/14/2022 0.2  0.2 or 1.0 E.U./dL Final   Nitrite, UA 06/14/2022 Negative  Negative Final   Leukocytes, UA 06/14/2022 Negative  Negative Final   Preg Test, Ur 06/14/2022 Negative  Negative Final   Specimen Description 06/14/2022 URINE, CLEAN CATCH   Final   Special Requests 06/14/2022    Final                   Value:NONE Performed at [facility], [place] [address] [place], [place] [address]    Culture 06/14/2022 MULTIPLE SPECIES PRESENT, SUGGEST RECOLLECTION (A)   Final   Report Status 06/14/2022 06/16/2022 FINAL   Final  Admission on 06/07/2022, Discharged on 06/10/2022  Component Date Value Ref Range Status   SARS Coronavirus 2 by RT PCR 06/07/2022 NEGATIVE  NEGATIVE Final   Comment: (NOTE) SARS-CoV-2 target nucleic acids are NOT DETECTED.  The SARS-CoV-2 RNA is generally detectable in upper respiratory specimens during the acute phase of infection. The lowest concentration of SARS-CoV-2 viral copies this assay can detect is 138 copies/mL. A negative result does not preclude SARS-Cov-2 infection and should not be used as the sole basis for treatment or other patient management decisions. A negative result may occur with  improper specimen collection/handling, submission of specimen other than nasopharyngeal swab, presence of viral mutation(s) within the areas targeted by this assay, and inadequate number of viral copies(<138 copies/mL). A negative result must be combined with clinical observations, patient history, and epidemiological information. The expected result is Negative.  Fact Sheet for Patients:  EntrepreneurPulse.com.au  Fact Sheet for Healthcare Providers:  IncredibleEmployment.be  This test is no                          t yet approved or  cleared by the [place] FDA and  has been authorized for detection and/or diagnosis of SARS-CoV-2 by FDA under an Emergency Use Authorization (EUA). This EUA will remain  in effect (meaning this test can be used) for the duration of the COVID-19 declaration under Section 564(b)(1) of the Act, 21 U.S.C.section 360bbb-3(b)(1), unless the authorization is terminated  or revoked sooner.       Influenza A by PCR 06/07/2022 NEGATIVE  NEGATIVE Final   Influenza B by PCR 06/07/2022 NEGATIVE  NEGATIVE Final   Comment: (NOTE) The Xpert Xpress SARS-CoV-2/FLU/RSV plus assay is intended as an aid in the diagnosis of influenza from Nasopharyngeal swab specimens and should not be used as a sole basis for treatment. Nasal washings and aspirates are unacceptable for Xpert Xpress SARS-CoV-2/FLU/RSV testing.  Fact Sheet for Patients: EntrepreneurPulse.com.au  Fact Sheet for Healthcare Providers: IncredibleEmployment.be  This test is not yet approved or cleared by the [place] FDA and has been authorized for detection and/or diagnosis of SARS-CoV-2 by FDA  under an Emergency Use Authorization (EUA). This EUA will remain in effect (meaning this test can be used) for the duration of the COVID-19 declaration under Section 564(b)(1) of the Act, 21 U.S.C. section 360bbb-3(b)(1), unless the authorization is terminated or revoked.  Performed at [facility], [address], [place], [place] [address]    WBC 06/07/2022 5.7  4.0 - 10.5 K/uL Final   RBC 06/07/2022 4.39  3.87 - 5.11 MIL/uL Final   Hemoglobin 06/07/2022 13.2  12.0 - 15.0 g/dL Final   HCT 29/56/2130 40.4  36.0 - 46.0 % Final   MCV 06/07/2022 92.0  80.0 - 100.0 fL Final   MCH 06/07/2022 30.1  26.0 - 34.0 pg Final   MCHC 06/07/2022 32.7  30.0 - 36.0 g/dL Final   RDW 86/57/8469 12.4  11.5 - 15.5 % Final   Platelets 06/07/2022 308  150 - 400 K/uL Final   nRBC 06/07/2022 0.0  0.0 - 0.2 % Final    Neutrophils Relative % 06/07/2022 42  % Final   Neutro Abs 06/07/2022 2.4  1.7 - 7.7 K/uL Final   Lymphocytes Relative 06/07/2022 54  % Final   Lymphs Abs 06/07/2022 3.1  0.7 - 4.0 K/uL Final   Monocytes Relative 06/07/2022 4  % Final   Monocytes Absolute 06/07/2022 0.2  0.1 - 1.0 K/uL Final   Eosinophils Relative 06/07/2022 0  % Final   Eosinophils Absolute 06/07/2022 0.0  0.0 - 0.5 K/uL Final   Basophils Relative 06/07/2022 0  % Final   Basophils Absolute 06/07/2022 0.0  0.0 - 0.1 K/uL Final   Immature Granulocytes 06/07/2022 0  % Final   Abs Immature Granulocytes 06/07/2022 0.01  0.00 - 0.07 K/uL Final   Performed at [facility], [address] [place], [place] [address]   Sodium 06/07/2022 139  135 - 145 mmol/L Final   Potassium 06/07/2022 4.0  3.5 - 5.1 mmol/L Final   Chloride 06/07/2022 104  98 - 111 mmol/L Final   CO2 06/07/2022 28  22 - 32 mmol/L Final   Glucose, Bld 06/07/2022 104 (H)  70 - 99 mg/dL Final   Glucose reference range applies only to samples taken after fasting for at least 8 hours.   BUN 06/07/2022 8  6 - 20 mg/dL Final   Creatinine, Ser 06/07/2022 0.84  0.44 - 1.00 mg/dL Final   Calcium 84/13/2440 9.1  8.9 - 10.3 mg/dL Final   Total Protein 08/10/2535 6.9  6.5 - 8.1 g/dL Final   Albumin 64/40/3474 3.7  3.5 - 5.0 g/dL Final   AST 25/95/6387 19  15 - 41 U/L Final   ALT 06/07/2022 22  0 - 44 U/L Final   Alkaline Phosphatase 06/07/2022 54  38 - 126 U/L Final   Total Bilirubin 06/07/2022 0.4  0.3 - 1.2 mg/dL Final   GFR, Estimated 06/07/2022 >60  >60 mL/min Final   Comment: (NOTE) Calculated using the CKD-EPI Creatinine Equation (2021)    Anion gap 06/07/2022 7  5 - 15 Final   Performed at [facility], [address] [place], [place] [address]   Hgb A1c MFr Bld 06/07/2022 5.0  4.8 - 5.6 % Final   Comment: (NOTE) Pre diabetes:          5.7%-6.4%  Diabetes:              >6.4%  Glycemic control for   <7.0% adults with diabetes    Mean  Plasma Glucose 06/07/2022  96.8  mg/dL Final   Performed at [facility], [address], [place], [place] [address]   TSH 06/07/2022 1.620  0.350 - 4.500 uIU/mL Final   Comment: Performed by a 3rd Generation assay with a functional sensitivity of <=0.01 uIU/mL. Performed at [facility], [address], [place], [place] [address]    RPR Ser Ql 06/07/2022 NON REACTIVE  NON REACTIVE Final   Performed at [facility], [address], [place], [place] [address]   Color, Urine 06/07/2022 YELLOW  YELLOW Final   APPearance 06/07/2022 HAZY (A)  CLEAR Final   Specific Gravity, Urine 06/07/2022 1.018  1.005 - 1.030 Final   pH 06/07/2022 7.0  5.0 - 8.0 Final   Glucose, UA 06/07/2022 NEGATIVE  NEGATIVE mg/dL Final   Hgb urine dipstick 06/07/2022 NEGATIVE  NEGATIVE Final   Bilirubin Urine 06/07/2022 NEGATIVE  NEGATIVE Final   Ketones, ur 06/07/2022 NEGATIVE  NEGATIVE mg/dL Final   Protein, ur 46/96/2952 NEGATIVE  NEGATIVE mg/dL Final   Nitrite 84/13/2440 NEGATIVE  NEGATIVE Final   Leukocytes,Ua 06/07/2022 NEGATIVE  NEGATIVE Final   Performed at [facility], [address], [place], [place] [address]   Cholesterol 06/07/2022 164  0 - 200 mg/dL Final   Triglycerides 53/66/4403 158 (H)  <150 mg/dL Final   HDL 47/42/5956 44  >40 mg/dL Final   Total CHOL/HDL Ratio 06/07/2022 3.7  RATIO Final   VLDL 06/07/2022 32  0 - 40 mg/dL Final   LDL Cholesterol 06/07/2022 88  0 - 99 mg/dL Final   Comment:        Total Cholesterol/HDL:CHD Risk Coronary Heart Disease Risk Table                     Men   Women  1/2 Average Risk   3.4   3.3  Average Risk       5.0   4.4  2 X Average Risk   9.6   7.1  3 X Average Risk  23.4   11.0        Use the calculated Patient Ratio above and the CHD Risk Table to determine the patient's CHD Risk.        ATP III CLASSIFICATION (LDL):  <100     mg/dL   Optimal  387-564  mg/dL   Near or Above                    Optimal  130-159  mg/dL    Borderline  332-951  mg/dL   High  >884     mg/dL   Very High Performed at [facility], [address] [place], [place] [address]    HIV Screen 4th Generation wRfx 06/07/2022 Non Reactive  Non Reactive Final   Performed at [facility], [address], [place], [place] [address]   SARSCOV2ONAVIRUS 2 AG 06/07/2022 NEGATIVE  NEGATIVE Final   Comment: (NOTE) SARS-CoV-2 antigen NOT DETECTED.   Negative results are presumptive.  Negative results do not preclude SARS-CoV-2 infection and should not be used as the sole basis for treatment or other patient management decisions, including infection  control decisions, particularly in the presence of clinical signs and  symptoms consistent with COVID-19, or in those who have been in contact with the virus.  Negative results must be combined with clinical observations, patient history, and epidemiological information. The expected result is Negative.  Fact Sheet for Patients:  https://www.jennings-kim.com/  Fact Sheet for Healthcare Providers: https://alexander-rogers.biz/  This test is not yet approved or cleared by the [place] FDA and  has been authorized for detection and/or diagnosis of SARS-CoV-2 by FDA under an Emergency Use Authorization (EUA).  This EUA will remain in effect (meaning this test can be used) for the duration of  the COV                          ID-19 declaration under Section 564(b)(1) of the Act, 21 U.S.C. section 360bbb-3(b)(1), unless the authorization is terminated or revoked sooner.     POC Amphetamine UR 06/07/2022 None Detected  NONE DETECTED (Cut Off Level 1000 ng/mL) Final   POC Secobarbital (BAR) 06/07/2022 None Detected  NONE DETECTED (Cut Off Level 300 ng/mL) Final   POC Buprenorphine (BUP) 06/07/2022 None Detected  NONE DETECTED (Cut Off Level 10 ng/mL) Final   POC Oxazepam (BZO) 06/07/2022 None Detected  NONE DETECTED (Cut Off Level 300 ng/mL) Final   POC  Cocaine UR 06/07/2022 None Detected  NONE DETECTED (Cut Off Level 300 ng/mL) Final   POC Methamphetamine UR 06/07/2022 None Detected  NONE DETECTED (Cut Off Level 1000 ng/mL) Final   POC Morphine 06/07/2022 None Detected  NONE DETECTED (Cut Off Level 300 ng/mL) Final   POC Methadone UR 06/07/2022 None Detected  NONE DETECTED (Cut Off Level 300 ng/mL) Final   POC Oxycodone UR 06/07/2022 None Detected  NONE DETECTED (Cut Off Level 100 ng/mL) Final   POC Marijuana UR 06/07/2022 None Detected  NONE DETECTED (Cut Off Level 50 ng/mL) Final   Preg Test, Ur 06/08/2022 NEGATIVE  NEGATIVE Final   Comment:        THE SENSITIVITY OF THIS METHODOLOGY IS >20 mIU/mL. Performed at [facility], [address], [place], [place] [address]    Preg Test, Ur 06/08/2022 NEGATIVE  NEGATIVE Final   Comment:        THE SENSITIVITY OF THIS METHODOLOGY IS >24 mIU/mL     Blood Alcohol level:  Lab Results  Component Value Date   ETH <10 07/04/2022   ETH <10 02/07/2022    Metabolic Disorder Labs: Lab Results  Component Value Date   HGBA1C 5.0 06/07/2022   MPG 96.8 06/07/2022   MPG 96.8 02/07/2022   No results found for: "PROLACTIN" Lab Results  Component Value Date   CHOL 181 07/25/2022   TRIG 118 07/25/2022   HDL 45 07/25/2022   CHOLHDL 4.0 07/25/2022   VLDL 24 07/25/2022   LDLCALC 112 (H) 07/25/2022   LDLCALC 88 06/07/2022    Therapeutic Lab Levels: No results found for: "LITHIUM" No results found for: "VALPROATE" No results found for: "CBMZ"  Physical Findings   GAD-7    Flowsheet Row Office Visit from 12/17/2021 in [facility]  Total GAD-7 Score 17      PHQ2-9    Flowsheet Row ED from 06/07/2022 in [facility] Office Visit from 12/17/2021 in [facility]  PHQ-2 Total Score 1 2  PHQ-9 Total Score 2 8      Flowsheet Row ED from 10/07/2022 in [facility] Most recent  reading at 10/07/2022  8:16 PM ED from 10/07/2022 in [facility] EMERGENCY DEPARTMENT Most recent reading at 10/07/2022 12:18 PM ED from 07/25/2022 in [facility] Most recent reading at 10/06/2022  9:38 AM  C-SSRS  RISK CATEGORY No Risk No Risk No Risk       Psychiatric Specialty Exam  Presentation  General Appearance:  Appropriate for Environment; Casual; Fairly Groomed  Eye Contact: Good  Speech: Clear and Coherent; Normal Rate  Speech Volume: Normal  Handedness: Right   Mood and Affect  Mood: Euthymic  Affect: Appropriate; Congruent; Full Range   Thought Process  Thought Processes: Coherent; Goal Directed; Linear  Descriptions of Associations:Intact  Orientation:Full (Time, Place and Person)  Thought Content:Logical; WDL  Diagnosis of Schizophrenia or Schizoaffective disorder in past: Yes    Hallucinations:Hallucinations: None   Ideas of Reference:None  Suicidal Thoughts:Suicidal Thoughts: No   Homicidal Thoughts:Homicidal Thoughts: No    Sensorium  Memory: Immediate Good  Judgment: Fair  Insight: Fair   Executive Functions  Concentration: Good  Attention Span: Good  Recall: Good  Fund of Knowledge: Good  Language: Good   Psychomotor Activity  Psychomotor Activity: Psychomotor Activity: Normal    Assets  Assets: Communication Skills; Desire for Improvement   Sleep  Sleep: Sleep: Good   Physical Exam  Physical Exam Vitals and nursing note reviewed.  Constitutional:      General: She is not in acute distress.    Appearance: Normal appearance. She is not ill-appearing, toxic-appearing or diaphoretic.  HENT:     Head: Normocephalic and atraumatic.  Eyes:     Extraocular Movements: Extraocular movements intact.  Cardiovascular:     Rate and Rhythm: Normal rate.  Pulmonary:     Effort: Pulmonary effort is normal. No respiratory distress.  Neurological:      General: No focal deficit present.     Mental Status: She is alert and oriented to person, place, and time.    Review of Systems  Respiratory:  Negative for shortness of breath.   Cardiovascular:  Negative for chest pain.  Gastrointestinal:  Positive for constipation. Negative for abdominal pain, diarrhea, nausea and vomiting.  Musculoskeletal:  Negative for back pain, myalgias and neck pain.  Neurological:  Negative for dizziness and headaches.  Psychiatric/Behavioral: Negative.     Blood pressure 101/65, pulse 86, temperature 98.1 F (36.7 C), temperature source Oral, resp. rate 16, height 5\' 1"  (1.549 m), weight 198 lb (89.8 kg), SpO2 100 %. Body mass index is 37.41 kg/m.  Treatment Plan Summary: Continue collaborative efforts to secure appropriate residential facility placement for patient with TOC/Legal Guardians. Patient remains psych cleared.    Health Maintenance Repeat A1c due to risk of DM  Princess BruinsJulie Jory Welke, DO-PGY2 10/15/2022 10:23 AM

## 2022-10-15 NOTE — ED Notes (Signed)
Pt asleep in bed. Respirations even and unlabored. Monitoring for safety. 

## 2022-10-15 NOTE — ED Notes (Signed)
Pt had bedtime snack. 

## 2022-10-15 NOTE — Care Management (Signed)
OBS Care Management   Writer left a HIPAA compliant voice mail message with the patient father and the DSS worker Oneal Grout regarding the status of the signature page on the patient PCP plan.    After Fridays meeting the social worker was to go to the legal guardians home and obtain a signature if they were able to speak with the legal guardian to confirm that the legal guardian would be at home.   Writer followed up with an email to the DSS worker Oneal Grout and cc the [facility] supervisor [name]).

## 2022-10-15 NOTE — ED Notes (Addendum)
[employer] is here with change of guardian paperwork.  This was served to [name] and placed in her chart. Dr. Alfonse Spruce aware.

## 2022-10-15 NOTE — ED Notes (Signed)
Patient observed/assessed in bed/chair resting quietly appearing in no distress and verbalizing no complaints at this time. Will continue to monitor.  

## 2022-10-15 NOTE — ED Notes (Signed)
Provider is in at this time to exam pt's ear

## 2022-10-15 NOTE — ED Notes (Signed)
[facility] lab courier called to transport labs to [facility] lab 

## 2022-10-15 NOTE — ED Notes (Signed)
Pt sleeping in recliner bed. Resp even and unlabored. Will continue to monitor for safety 

## 2022-10-15 NOTE — Plan of Care (Signed)
Subjective: Patient reported bilateral ear pain, that is new today.  Earlier this a.m., patient had no concerns of ear pain.  Patient denied putting foreign objects in it or other irritations into her ear.  Denied trauma to ear. Patient denied change in hearing.  Review of Systems  Constitutional:  Negative for chills and fever.  HENT:  Positive for congestion and ear pain. Negative for ear discharge, hearing loss and sore throat.   Gastrointestinal:  Negative for nausea and vomiting.  Neurological:  Negative for dizziness and headaches.    Objective: BP 101/65 (BP Location: Left Arm)   Pulse 86   Temp 98.1 F (36.7 C) (Oral)   Resp 16   Ht 5\' 1"  (1.549 m)   Wt 198 lb (89.8 kg)   SpO2 100%   BMI 37.41 kg/m   Physical Exam HENT:     Right Ear: Hearing, tympanic membrane, ear canal and external ear normal. No foreign body. Tympanic membrane is not perforated or erythematous.     Left Ear: Hearing, ear canal and external ear normal. There is impacted cerumen. No foreign body. Tympanic membrane is not erythematous.     Ears:     Comments: Unable to visualize left tympanic membrane due to ear wax.  However there is no redness or scabbing on superficial ear canal and external ear. No mastoid tenderness b/l    Assessment/plan: Suspect eustachian tube dysfunction B/l ear pain that is new. On exam no evidence of inner, middle or outer ear infection. Do not believe patient needs antibiotics at this time, but will continue to monitor. Afebrile. Unclear etiology, however strongly considering allergic rhinitis vs GERD or both. For GERD, patient is already on PPI. For rhinitis, patient is on 2nd gen antihistamine daily, will trial flonase to decongest.  Started flonase qHS  Signed: Merrily Brittle, DO Psychiatry Resident, PGY-2 [facility] 10/15/2022, 4:23 PM

## 2022-10-15 NOTE — ED Notes (Signed)
Patient alert and oriented x 3. Denies SI/HI/AVH. Denies intent or plan to harm self or others. Routine conducted according to faculty protocol. Encourage patient to notify staff with any needs or concerns. Patient verbalized agreement and understanding. Will continue to monitor for safety. 

## 2022-10-16 ENCOUNTER — Telehealth: Payer: Self-pay | Admitting: *Deleted

## 2022-10-16 ENCOUNTER — Encounter (HOSPITAL_COMMUNITY): Payer: Self-pay | Admitting: Registered Nurse

## 2022-10-16 DIAGNOSIS — F419 Anxiety disorder, unspecified: Secondary | ICD-10-CM | POA: Diagnosis not present

## 2022-10-16 DIAGNOSIS — Z1152 Encounter for screening for COVID-19: Secondary | ICD-10-CM | POA: Diagnosis not present

## 2022-10-16 DIAGNOSIS — F209 Schizophrenia, unspecified: Secondary | ICD-10-CM | POA: Diagnosis not present

## 2022-10-16 DIAGNOSIS — F333 Major depressive disorder, recurrent, severe with psychotic symptoms: Secondary | ICD-10-CM | POA: Diagnosis not present

## 2022-10-16 DIAGNOSIS — F17200 Nicotine dependence, unspecified, uncomplicated: Secondary | ICD-10-CM | POA: Diagnosis not present

## 2022-10-16 DIAGNOSIS — N390 Urinary tract infection, site not specified: Secondary | ICD-10-CM | POA: Diagnosis not present

## 2022-10-16 DIAGNOSIS — E118 Type 2 diabetes mellitus with unspecified complications: Secondary | ICD-10-CM | POA: Diagnosis not present

## 2022-10-16 DIAGNOSIS — N9489 Other specified conditions associated with female genital organs and menstrual cycle: Secondary | ICD-10-CM | POA: Diagnosis not present

## 2022-10-16 DIAGNOSIS — E039 Hypothyroidism, unspecified: Secondary | ICD-10-CM | POA: Diagnosis not present

## 2022-10-16 MED ORDER — CARBAMIDE PEROXIDE 6.5 % OT SOLN
5.0000 [drp] | Freq: Two times a day (BID) | OTIC | Status: AC
  Administered 2022-10-16: 5 [drp] via OTIC

## 2022-10-16 NOTE — Care Management (Addendum)
OBS Care Management   Treatment Team Meeting   In attendance: Nicole [name] - Patient  [place]) - Care Manager with Venetia Constable    There is a projected date of October 18, 2022 at 12 noon that the patient will discharge from the [facility] and safely transition to [facility].   Writer will assist with securing a follow up appointment with a psychiatrist and therapist for the patient.   Writer will coordinate with the NP to ensure that the patient has medication and a 30 prescription of her medication sent to [facility] on [address].   The Care Manager [facility]) will follow up with the previous group home owner in order to obtain the patients belongings.   Writer informed the RN and the NP [facility]) working with the patient.

## 2022-10-16 NOTE — ED Notes (Signed)
Pt was given muffins and juice for breakfast.  

## 2022-10-16 NOTE — ED Notes (Signed)
Pt sleeping@this time. Breathing even and unlabored. Will continue to monitor for safety 

## 2022-10-16 NOTE — ED Notes (Signed)
Pt sleeping at present, no distress noted.  Monitoring for safety. 

## 2022-10-16 NOTE — ED Provider Notes (Signed)
Behavioral Health Progress Note  Date and Time: 10/16/2022 11:49 AM Name: Nicole [name] MRN:  [id number]  Brief HPI: Nicole [name] 32 y.o., female patient with schizophrenia, MDD with psychosis,  [facility], as a walk-in accompanied by [employer] after a disagreement and verbal altercation with personnel at the group home.  She has been dismissed previously from other group homes due to threatening behaviors, and suicidal ideation and the one which she was residing in.  Patient was admitted to continuous assessment unit on 07/25/2022 as she also endorsed passive SI at that time along with symptoms of visual hallucination. Patient was subsequently psychiatrically cleared during her admission to the continuous assessment unit.  However patient remains [facility] at [facility] bordering until adequate- residential placement can be obtained.     Legal Guardian: [name](Maxine Brown) transitioning to be [name](Albert Brown) Point of contact: [name](Albert Brown)   Tests administered: Wechsler Adult Intelligence Scale-4 (see Jolene ProvostMark Lewis, PhD consult note on 08/19/2022 for full info) [place]-3 Adaptive Behavior Scales Assessment completed on 09/13/2022 administered by Lamar Sprinklesourtney Cosby, MD   Subjective:  "My ears still hurt"  Nicole [name], 32 y.o., female patient seen face to face by this provider, consulted with Dr. Nelly RoutArchana Kumar; and chart reviewed on 10/16/22.  On evaluation Nicole [name] reports she continues to have ear pain.  Patient continues to deny suicidal/self-harm/homicidal ideation, psychosis, and paranoia.  Reports she is eating/sleeping without difficulty and taking medications as ordered with no adverse reaction.   During evaluation Nicole [name] is sitting on side of bed with no noted distress.  She is alert, oriented x 4, calm, cooperative and attentive.  Her mood is euthymic with congruent affect.  She has normal speech, and behavior.  Objectively there is no evidence of psychosis/mania  or delusional thinking.  Patient is able to converse coherently, goal directed thoughts, no distractibility, or pre-occupation.  She continues to deny suicidal/self-harm/homicidal ideation, psychosis, and paranoia.  Patient answered question appropriately.    Diagnosis:  Final diagnoses:  Tobacco use disorder  Schizophrenia, unspecified type (HCC)  MDD (major depressive disorder), recurrent episode, moderate (HCC)  Hypothyroidism, unspecified type    Total Time spent with patient: 15 minutes  Past Psychiatric History: schizoaffective, borderline IDD, anxiety, and MDD  Past Medical History:  Past Medical History:  Diagnosis Date   Anxiety    Depression    Hypothyroidism 08/07/2022   Tobacco use disorder 08/07/2022    Past Surgical History:  Procedure Laterality Date   WISDOM TOOTH EXTRACTION Bilateral 2020   Family History:  Family History  Problem Relation Age of Onset   Hypertension Father    Diabetes Father    Family Psychiatric  History: None reported Social History:  Social History   Substance and Sexual Activity  Alcohol Use Never     Social History   Substance and Sexual Activity  Drug Use Never    Social History   Socioeconomic History   Marital status: Single    Spouse name: Not on file   Number of children: Not on file   Years of education: Not on file   Highest education level: Not on file  Occupational History   Not on file  Tobacco Use   Smoking status: Every Day    Types: Cigarettes   Smokeless tobacco: Never  Vaping Use   Vaping Use: Some days  Substance and Sexual Activity   Alcohol use: Never   Drug use: Never   Sexual activity: Yes  Partners: Female    Birth control/protection: Implant  Other Topics Concern   Not on file  Social History Narrative   Not on file   Social Determinants of Health   Financial Resource Strain: Not on file  Food Insecurity: Not on file  Transportation Needs: Not on file  Physical Activity: Not on  file  Stress: Not on file  Social Connections: Not on file   SDOH:  SDOH Screenings   Depression (PHQ2-9): Low Risk  (06/10/2022)  Tobacco Use: High Risk (10/16/2022)   Additional Social History:                         Sleep: Good  Appetite:  Good  Current Medications:  Current Facility-Administered Medications  Medication Dose Route Frequency Provider Last Rate Last Admin   acetaminophen (TYLENOL) tablet 650 mg  650 mg Oral Q6H PRN Marlou SaByungura, Veronique M, NP       alum & mag hydroxide-simeth (MAALOX/MYLANTA) 200-200-20 MG/5ML suspension 30 mL  30 mL Oral Q4H PRN Rayburn GoByungura, Veronique M, NP   30 mL at 10/16/22 0431   carbamide peroxide (DEBROX) 6.5 % OTIC (EAR) solution 5 drop  5 drop Both EARS BID Elwyn Klosinski B, NP       cetaphil lotion   Topical PRN Princess BruinsNguyen, Julie, DO       fluticasone (FLONASE) 50 MCG/ACT nasal spray 1 spray  1 spray Each Nare QHS Princess BruinsNguyen, Julie, DO   1 spray at 10/15/22 2233   hydrOXYzine (ATARAX) tablet 25 mg  25 mg Oral TID PRN Lorri Frederickarrion-Carrero, Margely, MD   25 mg at 10/15/22 2233   levothyroxine (SYNTHROID) tablet 100 mcg  100 mcg Oral Once Byungura, Veronique M, NP       levothyroxine (SYNTHROID) tablet 100 mcg  100 mcg Oral Q0600 Marlou SaByungura, Veronique M, NP   100 mcg at 10/16/22 0615   loratadine (CLARITIN) tablet 10 mg  10 mg Oral Daily Carrion-Carrero, Karle StarchMargely, MD   10 mg at 10/16/22 [id number]   magnesium hydroxide (MILK OF MAGNESIA) suspension 30 mL  30 mL Oral Daily PRN Marlou SaByungura, Veronique M, NP   30 mL at 10/12/22 2145   metFORMIN (GLUCOPHAGE) tablet 500 mg  500 mg Oral Q breakfast Carrion-Carrero, Margely, MD   500 mg at 10/16/22 [id number]   nicotine (NICODERM CQ - dosed in mg/24 hours) patch 14 mg  14 mg Transdermal Daily PRN Carrion-Carrero, Karle StarchMargely, MD       ondansetron (ZOFRAN-ODT) disintegrating tablet 4 mg  4 mg Oral Q8H PRN Carrion-Carrero, Margely, MD   4 mg at 10/16/22 0431   Oxcarbazepine (TRILEPTAL) tablet 300 mg  300 mg Oral BID Rayburn GoByungura,  Veronique M, NP   300 mg at 10/16/22 0938   pantoprazole (PROTONIX) EC tablet 40 mg  40 mg Oral Daily Carrion-Carrero, Karle StarchMargely, MD   40 mg at 10/16/22 0938   polyethylene glycol (MIRALAX / GLYCOLAX) packet 17 g  17 g Oral Daily Princess BruinsNguyen, Julie, DO   17 g at 10/16/22 [id number]   QUEtiapine (SEROQUEL) tablet 400 mg  400 mg Oral BID Olin PiaByungura, Veronique M, NP   400 mg at 10/16/22 [id number]   sertraline (ZOLOFT) tablet 150 mg  150 mg Oral Daily Carrion-Carrero, Karle StarchMargely, MD   150 mg at 10/16/22 0937   traZODone (DESYREL) tablet 100 mg  100 mg Oral QHS Marlou SaByungura, Veronique M, NP   100 mg at 10/15/22 2232   traZODone (DESYREL) tablet 50 mg  50 mg Oral QHS PRN  Haynes Kerns, NP   50 mg at 10/15/22 2233   valACYclovir (VALTREX) tablet 500 mg  500 mg Oral Daily Carrion-Carrero, Caryl Ada, MD   500 mg at 10/16/22 5621   Current Outpatient Medications  Medication Sig Dispense Refill   ABILIFY MAINTENA 400 MG PRSY prefilled syringe 400 mg every 28 (twenty-eight) days.     cetirizine (ZYRTEC) 10 MG tablet Take 10 mg by mouth daily.     cyclobenzaprine (FLEXERIL) 10 MG tablet Take 1 tablet (10 mg total) by mouth 2 (two) times daily as needed for muscle spasms. 20 tablet 0   fluticasone (FLONASE) 50 MCG/ACT nasal spray Place 1 spray into both nostrils daily.     meloxicam (MOBIC) 15 MG tablet Take 15 mg by mouth daily.     nitrofurantoin, macrocrystal-monohydrate, (MACROBID) 100 MG capsule Take 1 capsule (100 mg total) by mouth 2 (two) times daily. 10 capsule 0   ondansetron (ZOFRAN-ODT) 4 MG disintegrating tablet Take 1 tablet (4 mg total) by mouth every 8 (eight) hours as needed for nausea or vomiting. 20 tablet 0   Oxcarbazepine (TRILEPTAL) 300 MG tablet Take 1 tablet (300 mg total) by mouth 2 (two) times daily. 60 tablet 0   QUEtiapine (SEROQUEL) 400 MG tablet Take 1 tablet (400 mg total) by mouth 2 (two) times daily. 60 tablet 0   sertraline (ZOLOFT) 50 MG tablet Take 3 tablets (150 mg total) by mouth in the  morning. 90 tablet 0   traZODone (DESYREL) 100 MG tablet Take 1 tablet (100 mg total) by mouth at bedtime. 30 tablet 0   valACYclovir (VALTREX) 500 MG tablet Take 500 mg by mouth daily.      Labs  Lab Results:  Admission on 10/07/2022  Component Date Value Ref Range Status   Hgb A1c MFr Bld 10/15/2022 5.7 (H)  4.8 - 5.6 % Final   Comment: (NOTE)         Prediabetes: 5.7 - 6.4         Diabetes: >6.4         Glycemic control for adults with diabetes: <7.0    Mean Plasma Glucose 10/15/2022 117  mg/dL Final   Comment: (NOTE) Performed At: [facility] [place], [place] [id number] Rush Farmer MD NG:[id number]   Admission on 10/07/2022, Discharged on 10/07/2022  Component Date Value Ref Range Status   Color, Urine 10/07/2022 YELLOW  YELLOW Final   APPearance 10/07/2022 HAZY (A)  CLEAR Final   Specific Gravity, Urine 10/07/2022 1.011  1.005 - 1.030 Final   pH 10/07/2022 7.0  5.0 - 8.0 Final   Glucose, UA 10/07/2022 NEGATIVE  NEGATIVE mg/dL Final   Hgb urine dipstick 10/07/2022 SMALL (A)  NEGATIVE Final   Bilirubin Urine 10/07/2022 NEGATIVE  NEGATIVE Final   Ketones, ur 10/07/2022 NEGATIVE  NEGATIVE mg/dL Final   Protein, ur 10/07/2022 NEGATIVE  NEGATIVE mg/dL Final   Nitrite 10/07/2022 NEGATIVE  NEGATIVE Final   Leukocytes,Ua 10/07/2022 LARGE (A)  NEGATIVE Final   RBC / HPF 10/07/2022 0-5  0 - 5 RBC/hpf Final   WBC, UA 10/07/2022 0-5  0 - 5 WBC/hpf Final   Bacteria, UA 10/07/2022 FEW (A)  NONE SEEN Final   Squamous Epithelial / LPF 10/07/2022 11-20  0 - 5 Final   Performed at [facility], [place] [address], [place], [place] [address]   I-stat hCG, quantitative 10/07/2022 <5.0  <5 mIU/mL Final   Comment 3 10/07/2022  Final   Comment:   GEST. AGE      CONC.  (mIU/mL)   <=1 WEEK        5 - 50     2 WEEKS       50 - 500     3 WEEKS       100 - 10,000     4 WEEKS     1,000 - 30,000        FEMALE AND NON-PREGNANT FEMALE:     LESS THAN 5 mIU/mL     Sodium 10/07/2022 138  135 - 145 mmol/L Final   Potassium 10/07/2022 4.3  3.5 - 5.1 mmol/L Final   Chloride 10/07/2022 104  98 - 111 mmol/L Final   CO2 10/07/2022 28  22 - 32 mmol/L Final   Glucose, Bld 10/07/2022 90  70 - 99 mg/dL Final   Glucose reference range applies only to samples taken after fasting for at least 8 hours.   BUN 10/07/2022 8  6 - 20 mg/dL Final   Creatinine, Ser 10/07/2022 0.96  0.44 - 1.00 mg/dL Final   Calcium 16/07/9603 9.1  8.9 - 10.3 mg/dL Final   GFR, Estimated 10/07/2022 >60  >60 mL/min Final   Comment: (NOTE) Calculated using the CKD-EPI Creatinine Equation (2021)    Anion gap 10/07/2022 6  5 - 15 Final   Performed at [facility], [address], [place], [place] [address]   WBC 10/07/2022 5.8  4.0 - 10.5 K/uL Final   RBC 10/07/2022 4.36  3.87 - 5.11 MIL/uL Final   Hemoglobin 10/07/2022 12.9  12.0 - 15.0 g/dL Final   HCT 11/91/4782 40.0  36.0 - 46.0 % Final   MCV 10/07/2022 91.7  80.0 - 100.0 fL Final   MCH 10/07/2022 29.6  26.0 - 34.0 pg Final   MCHC 10/07/2022 32.3  30.0 - 36.0 g/dL Final   RDW 95/62/1308 12.4  11.5 - 15.5 % Final   Platelets 10/07/2022 295  150 - 400 K/uL Final   nRBC 10/07/2022 0.0  0.0 - 0.2 % Final   Performed at [facility], [address] [place], [place] [address]   SARS Coronavirus 2 by RT PCR 10/07/2022 NEGATIVE  NEGATIVE Final   Comment: (NOTE) SARS-CoV-2 target nucleic acids are NOT DETECTED.  The SARS-CoV-2 RNA is generally detectable in upper respiratory specimens during the acute phase of infection. The lowest concentration of SARS-CoV-2 viral copies this assay can detect is 138 copies/mL. A negative result does not preclude SARS-Cov-2 infection and should not be used as the sole basis for treatment or other patient management decisions. A negative result may occur with  improper specimen collection/handling, submission of specimen other than nasopharyngeal swab, presence of viral mutation(s) within  the areas targeted by this assay, and inadequate number of viral copies(<138 copies/mL). A negative result must be combined with clinical observations, patient history, and epidemiological information. The expected result is Negative.  Fact Sheet for Patients:  BloggerCourse.com  Fact Sheet for Healthcare Providers:  SeriousBroker.it  This test is no                          t yet approved or cleared by the [place] FDA and  has been authorized for detection and/or diagnosis of SARS-CoV-2 by FDA under an Emergency Use Authorization (EUA). This EUA will remain  in effect (meaning this test can be used) for the duration of the  COVID-19 declaration under Section 564(b)(1) of the Act, 21 U.S.C.section 360bbb-3(b)(1), unless the authorization is terminated  or revoked sooner.       Influenza A by PCR 10/07/2022 NEGATIVE  NEGATIVE Final   Influenza B by PCR 10/07/2022 NEGATIVE  NEGATIVE Final   Comment: (NOTE) The Xpert Xpress SARS-CoV-2/FLU/RSV plus assay is intended as an aid in the diagnosis of influenza from Nasopharyngeal swab specimens and should not be used as a sole basis for treatment. Nasal washings and aspirates are unacceptable for Xpert Xpress SARS-CoV-2/FLU/RSV testing.  Fact Sheet for Patients: BloggerCourse.comhttps://www.fda.gov/media/152166/download  Fact Sheet for Healthcare Providers: SeriousBroker.ithttps://www.fda.gov/media/152162/download  This test is not yet approved or cleared by the [place] FDA and has been authorized for detection and/or diagnosis of SARS-CoV-2 by FDA under an Emergency Use Authorization (EUA). This EUA will remain in effect (meaning this test can be used) for the duration of the COVID-19 declaration under Section 564(b)(1) of the Act, 21 U.S.C. section 360bbb-3(b)(1), unless the authorization is terminated or revoked.     Resp Syncytial Virus by PCR 10/07/2022 NEGATIVE  NEGATIVE Final   Comment:  (NOTE) Fact Sheet for Patients: BloggerCourse.comhttps://www.fda.gov/media/152166/download  Fact Sheet for Healthcare Providers: SeriousBroker.ithttps://www.fda.gov/media/152162/download  This test is not yet approved or cleared by the [place] FDA and has been authorized for detection and/or diagnosis of SARS-CoV-2 by FDA under an Emergency Use Authorization (EUA). This EUA will remain in effect (meaning this test can be used) for the duration of the COVID-19 declaration under Section 564(b)(1) of the Act, 21 U.S.C. section 360bbb-3(b)(1), unless the authorization is terminated or revoked.  Performed at [facility], [address] [place], [place] [address]    Specimen Description 10/07/2022 URINE, CLEAN CATCH   Final   Special Requests 10/07/2022    Final                   Value:NONE Performed at [facility], [address] [place], [place] [address]    Culture 10/07/2022 MULTIPLE SPECIES PRESENT, SUGGEST RECOLLECTION (A)   Final   Report Status 10/07/2022 10/08/2022 FINAL   Final  Admission on 07/25/2022, Discharged on 10/07/2022  Component Date Value Ref Range Status   SARS Coronavirus 2 by RT PCR 07/25/2022 NEGATIVE  NEGATIVE Final   Comment: (NOTE) SARS-CoV-2 target nucleic acids are NOT DETECTED.  The SARS-CoV-2 RNA is generally detectable in upper respiratory specimens during the acute phase of infection. The lowest concentration of SARS-CoV-2 viral copies this assay can detect is 138 copies/mL. A negative result does not preclude SARS-Cov-2 infection and should not be used as the sole basis for treatment or other patient management decisions. A negative result may occur with  improper specimen collection/handling, submission of specimen other than nasopharyngeal swab, presence of viral mutation(s) within the areas targeted by this assay, and inadequate number of viral copies(<138 copies/mL). A negative result must be combined with clinical observations, patient history, and  epidemiological information. The expected result is Negative.  Fact Sheet for Patients:  BloggerCourse.comhttps://www.fda.gov/media/152166/download  Fact Sheet for Healthcare Providers:  SeriousBroker.ithttps://www.fda.gov/media/152162/download  This test is no                          t yet approved or cleared by the [place] FDA and  has been authorized for detection and/or diagnosis of SARS-CoV-2 by FDA under an Emergency Use Authorization (EUA). This EUA will remain  in effect (meaning this test can be used) for the  duration of the COVID-19 declaration under Section 564(b)(1) of the Act, 21 U.S.C.section 360bbb-3(b)(1), unless the authorization is terminated  or revoked sooner.       Influenza A by PCR 07/25/2022 NEGATIVE  NEGATIVE Final   Influenza B by PCR 07/25/2022 NEGATIVE  NEGATIVE Final   Comment: (NOTE) The Xpert Xpress SARS-CoV-2/FLU/RSV plus assay is intended as an aid in the diagnosis of influenza from Nasopharyngeal swab specimens and should not be used as a sole basis for treatment. Nasal washings and aspirates are unacceptable for Xpert Xpress SARS-CoV-2/FLU/RSV testing.  Fact Sheet for Patients: BloggerCourse.com  Fact Sheet for Healthcare Providers: SeriousBroker.it  This test is not yet approved or cleared by the [place] FDA and has been authorized for detection and/or diagnosis of SARS-CoV-2 by FDA under an Emergency Use Authorization (EUA). This EUA will remain in effect (meaning this test can be used) for the duration of the COVID-19 declaration under Section 564(b)(1) of the Act, 21 U.S.C. section 360bbb-3(b)(1), unless the authorization is terminated or revoked.  Performed at [facility], [address], [place], [place] [address]    WBC 07/25/2022 8.3  4.0 - 10.5 K/uL Final   RBC 07/25/2022 4.44  3.87 - 5.11 MIL/uL Final   Hemoglobin 07/25/2022 13.7  12.0 - 15.0 g/dL Final   HCT 41/32/4401 40.2  36.0 - 46.0  % Final   MCV 07/25/2022 90.5  80.0 - 100.0 fL Final   MCH 07/25/2022 30.9  26.0 - 34.0 pg Final   MCHC 07/25/2022 34.1  30.0 - 36.0 g/dL Final   RDW 02/72/5366 12.2  11.5 - 15.5 % Final   Platelets 07/25/2022 248  150 - 400 K/uL Final   nRBC 07/25/2022 0.0  0.0 - 0.2 % Final   Neutrophils Relative % 07/25/2022 43  % Final   Neutro Abs 07/25/2022 3.6  1.7 - 7.7 K/uL Final   Lymphocytes Relative 07/25/2022 52  % Final   Lymphs Abs 07/25/2022 4.2 (H)  0.7 - 4.0 K/uL Final   Monocytes Relative 07/25/2022 5  % Final   Monocytes Absolute 07/25/2022 0.4  0.1 - 1.0 K/uL Final   Eosinophils Relative 07/25/2022 0  % Final   Eosinophils Absolute 07/25/2022 0.0  0.0 - 0.5 K/uL Final   Basophils Relative 07/25/2022 0  % Final   Basophils Absolute 07/25/2022 0.0  0.0 - 0.1 K/uL Final   Immature Granulocytes 07/25/2022 0  % Final   Abs Immature Granulocytes 07/25/2022 0.02  0.00 - 0.07 K/uL Final   Performed at [facility], [address] [place], [place] [address]   Sodium 07/25/2022 138  135 - 145 mmol/L Final   Potassium 07/25/2022 4.0  3.5 - 5.1 mmol/L Final   Chloride 07/25/2022 104  98 - 111 mmol/L Final   CO2 07/25/2022 29  22 - 32 mmol/L Final   Glucose, Bld 07/25/2022 83  70 - 99 mg/dL Final   Glucose reference range applies only to samples taken after fasting for at least 8 hours.   BUN 07/25/2022 11  6 - 20 mg/dL Final   Creatinine, Ser 07/25/2022 0.97  0.44 - 1.00 mg/dL Final   Calcium 74/25/9563 9.2  8.9 - 10.3 mg/dL Final   Total Protein 87/56/4332 7.0  6.5 - 8.1 g/dL Final   Albumin 95/18/8416 3.8  3.5 - 5.0 g/dL Final   AST 60/63/0160 18  15 - 41 U/L Final   ALT 07/25/2022 22  0 - 44 U/L Final  Alkaline Phosphatase 07/25/2022 64  38 - 126 U/L Final   Total Bilirubin 07/25/2022 0.2 (L)  0.3 - 1.2 mg/dL Final   GFR, Estimated 07/25/2022 >60  >60 mL/min Final   Comment: (NOTE) Calculated using the CKD-EPI Creatinine Equation (2021)    Anion gap 07/25/2022 5  5 - 15  Final   Performed at [facility], [address], [place], [place] [address]   POC Amphetamine UR 07/25/2022 None Detected  NONE DETECTED (Cut Off Level 1000 ng/mL) Preliminary   POC Secobarbital (BAR) 07/25/2022 None Detected  NONE DETECTED (Cut Off Level 300 ng/mL) Preliminary   POC Buprenorphine (BUP) 07/25/2022 None Detected  NONE DETECTED (Cut Off Level 10 ng/mL) Preliminary   POC Oxazepam (BZO) 07/25/2022 None Detected  NONE DETECTED (Cut Off Level 300 ng/mL) Preliminary   POC Cocaine UR 07/25/2022 None Detected  NONE DETECTED (Cut Off Level 300 ng/mL) Preliminary   POC Methamphetamine UR 07/25/2022 None Detected  NONE DETECTED (Cut Off Level 1000 ng/mL) Preliminary   POC Morphine 07/25/2022 None Detected  NONE DETECTED (Cut Off Level 300 ng/mL) Preliminary   POC Methadone UR 07/25/2022 None Detected  NONE DETECTED (Cut Off Level 300 ng/mL) Preliminary   POC Oxycodone UR 07/25/2022 None Detected  NONE DETECTED (Cut Off Level 100 ng/mL) Preliminary   POC Marijuana UR 07/25/2022 None Detected  NONE DETECTED (Cut Off Level 50 ng/mL) Preliminary   SARSCOV2ONAVIRUS 2 AG 07/25/2022 NEGATIVE  NEGATIVE Final   Comment: (NOTE) SARS-CoV-2 antigen NOT DETECTED.   Negative results are presumptive.  Negative results do not preclude SARS-CoV-2 infection and should not be used as the sole basis for treatment or other patient management decisions, including infection  control decisions, particularly in the presence of clinical signs and  symptoms consistent with COVID-19, or in those who have been in contact with the virus.  Negative results must be combined with clinical observations, patient history, and epidemiological information. The expected result is Negative.  Fact Sheet for Patients: https://www.jennings-kim.com/  Fact Sheet for Healthcare Providers: https://alexander-rogers.biz/  This test is not yet approved or cleared by the [place] FDA and  has  been authorized for detection and/or diagnosis of SARS-CoV-2 by FDA under an Emergency Use Authorization (EUA).  This EUA will remain in effect (meaning this test can be used) for the duration of  the COV                          ID-19 declaration under Section 564(b)(1) of the Act, 21 U.S.C. section 360bbb-3(b)(1), unless the authorization is terminated or revoked sooner.     Cholesterol 07/25/2022 181  0 - 200 mg/dL Final   Triglycerides 60/45/4098 118  <150 mg/dL Final   HDL 11/91/4782 45  >40 mg/dL Final   Total CHOL/HDL Ratio 07/25/2022 4.0  RATIO Final   VLDL 07/25/2022 24  0 - 40 mg/dL Final   LDL Cholesterol 07/25/2022 112 (H)  0 - 99 mg/dL Final   Comment:        Total Cholesterol/HDL:CHD Risk Coronary Heart Disease Risk Table                     Men   Women  1/2 Average Risk   3.4   3.3  Average Risk       5.0   4.4  2 X Average Risk   9.6   7.1  3 X Average Risk  23.4   11.0  Use the calculated Patient Ratio above and the CHD Risk Table to determine the patient's CHD Risk.        ATP III CLASSIFICATION (LDL):  <100     mg/dL   Optimal  590-931  mg/dL   Near or Above                    Optimal  130-159  mg/dL   Borderline  121-624  mg/dL   High  >469     mg/dL   Very High Performed at [facility], [address] [place], [place] [address]    TSH 07/25/2022 6.668 (H)  0.350 - 4.500 uIU/mL Final   Comment: Performed by a 3rd Generation assay with a functional sensitivity of <=0.01 uIU/mL. Performed at [facility], [address] [place], [place] [address]    Glucose-Capillary 07/26/2022 104 (H)  70 - 99 mg/dL Final   Glucose reference range applies only to samples taken after fasting for at least 8 hours.   T3, Free 07/31/2022 2.3  2.0 - 4.4 pg/mL Final   Comment: (NOTE) Performed At: [facility] [address] [place], [place] [id number] Jolene Schimke MD GF:[id number]    Free T4 07/31/2022 0.60 (L)  0.61 - 1.12 ng/dL Final    Comment: (NOTE) Biotin ingestion may interfere with free T4 tests. If the results are inconsistent with the TSH level, previous test results, or the clinical presentation, then consider biotin interference. If needed, order repeat testing after stopping biotin. Performed at [facility], [address] [place], [place] [address]    Glucose-Capillary 08/29/2022 100 (H)  70 - 99 mg/dL Final   Glucose reference range applies only to samples taken after fasting for at least 8 hours.   TSH 09/05/2022 0.793  0.350 - 4.500 uIU/mL Final   Comment: Performed by a 3rd Generation assay with a functional sensitivity of <=0.01 uIU/mL. Performed at [facility], [address], [place], [place] [address]    Free T4 09/05/2022 0.73  0.61 - 1.12 ng/dL Final   Comment: (NOTE) Biotin ingestion may interfere with free T4 tests. If the results are inconsistent with the TSH level, previous test results, or the clinical presentation, then consider biotin interference. If needed, order repeat testing after stopping biotin. Performed at [facility], [address], [place], [place] [address]    Preg Test, Ur 09/06/2022 Negative  Negative Final   Preg Test, Ur 09/06/2022 NEGATIVE  NEGATIVE Final   Comment:        THE SENSITIVITY OF THIS METHODOLOGY IS >24 mIU/mL    Preg Test, Ur 09/05/2022 NEGATIVE  NEGATIVE Final   Comment:        THE SENSITIVITY OF THIS METHODOLOGY IS >24 mIU/mL    Sodium 09/13/2022 136  135 - 145 mmol/L Final   Potassium 09/13/2022 4.5  3.5 - 5.1 mmol/L Final   Chloride 09/13/2022 105  98 - 111 mmol/L Final   CO2 09/13/2022 21 (L)  22 - 32 mmol/L Final   Glucose, Bld 09/13/2022 111 (H)  70 - 99 mg/dL Final   Glucose reference range applies only to samples taken after fasting for at least 8 hours.   BUN 09/13/2022 14  6 - 20 mg/dL Final   Creatinine, Ser 09/13/2022 0.92  0.44 - 1.00 mg/dL Final   Calcium 47/04/6150 9.1  8.9 - 10.3 mg/dL Final   GFR, Estimated  09/13/2022 >60  >60 mL/min  Final   Comment: (NOTE) Calculated using the CKD-EPI Creatinine Equation (2021)    Anion gap 09/13/2022 10  5 - 15 Final   Performed at [facility], [address], [place], [place] [address]   Vitamin B-12 09/13/2022 585  180 - 914 pg/mL Final   Comment: (NOTE) This assay is not validated for testing neonatal or myeloproliferative syndrome specimens for Vitamin B12 levels. Performed at [facility], [address], [place], [place] [address]    Color, Urine 09/14/2022 YELLOW  YELLOW Final   APPearance 09/14/2022 HAZY (A)  CLEAR Final   Specific Gravity, Urine 09/14/2022 1.025  1.005 - 1.030 Final   pH 09/14/2022 5.0  5.0 - 8.0 Final   Glucose, UA 09/14/2022 NEGATIVE  NEGATIVE mg/dL Final   Hgb urine dipstick 09/14/2022 NEGATIVE  NEGATIVE Final   Bilirubin Urine 09/14/2022 NEGATIVE  NEGATIVE Final   Ketones, ur 09/14/2022 NEGATIVE  NEGATIVE mg/dL Final   Protein, ur 13/05/6577 NEGATIVE  NEGATIVE mg/dL Final   Nitrite 46/96/2952 NEGATIVE  NEGATIVE Final   Leukocytes,Ua 09/14/2022 TRACE (A)  NEGATIVE Final   RBC / HPF 09/14/2022 0-5  0 - 5 RBC/hpf Final   WBC, UA 09/14/2022 0-5  0 - 5 WBC/hpf Final   Bacteria, UA 09/14/2022 RARE (A)  NONE SEEN Final   Squamous Epithelial / LPF 09/14/2022 0-5  0 - 5 Final   Mucus 09/14/2022 PRESENT   Final   Performed at [facility], [address] [place], [place] [address]   Glucose-Capillary 09/16/2022 105 (H)  70 - 99 mg/dL Final   Glucose reference range applies only to samples taken after fasting for at least 8 hours.   SARS Coronavirus 2 by RT PCR 09/30/2022 NEGATIVE  NEGATIVE Final   Comment: (NOTE) SARS-CoV-2 target nucleic acids are NOT DETECTED.  The SARS-CoV-2 RNA is generally detectable in upper respiratory specimens during the acute phase of infection. The lowest concentration of SARS-CoV-2 viral copies this assay can detect is 138 copies/mL. A negative result does not preclude  SARS-Cov-2 infection and should not be used as the sole basis for treatment or other patient management decisions. A negative result may occur with  improper specimen collection/handling, submission of specimen other than nasopharyngeal swab, presence of viral mutation(s) within the areas targeted by this assay, and inadequate number of viral copies(<138 copies/mL). A negative result must be combined with clinical observations, patient history, and epidemiological information. The expected result is Negative.  Fact Sheet for Patients:  BloggerCourse.com  Fact Sheet for Healthcare Providers:  SeriousBroker.it  This test is no                          t yet approved or cleared by the [place] FDA and  has been authorized for detection and/or diagnosis of SARS-CoV-2 by FDA under an Emergency Use Authorization (EUA). This EUA will remain  in effect (meaning this test can be used) for the duration of the COVID-19 declaration under Section 564(b)(1) of the Act, 21 U.S.C.section 360bbb-3(b)(1), unless the authorization is terminated  or revoked sooner.       Influenza A by PCR 09/30/2022 NEGATIVE  NEGATIVE Final   Influenza B by PCR 09/30/2022 NEGATIVE  NEGATIVE Final   Comment: (NOTE) The Xpert Xpress SARS-CoV-2/FLU/RSV plus assay is intended as an aid in the diagnosis of influenza from Nasopharyngeal swab specimens and should not be used as a sole basis for treatment. Nasal  washings and aspirates are unacceptable for Xpert Xpress SARS-CoV-2/FLU/RSV testing.  Fact Sheet for Patients: BloggerCourse.com  Fact Sheet for Healthcare Providers: SeriousBroker.it  This test is not yet approved or cleared by the [place] FDA and has been authorized for detection and/or diagnosis of SARS-CoV-2 by FDA under an Emergency Use Authorization (EUA). This EUA will remain in effect  (meaning this test can be used) for the duration of the COVID-19 declaration under Section 564(b)(1) of the Act, 21 U.S.C. section 360bbb-3(b)(1), unless the authorization is terminated or revoked.     Resp Syncytial Virus by PCR 09/30/2022 NEGATIVE  NEGATIVE Final   Comment: (NOTE) Fact Sheet for Patients: BloggerCourse.com  Fact Sheet for Healthcare Providers: SeriousBroker.it  This test is not yet approved or cleared by the [place] FDA and has been authorized for detection and/or diagnosis of SARS-CoV-2 by FDA under an Emergency Use Authorization (EUA). This EUA will remain in effect (meaning this test can be used) for the duration of the COVID-19 declaration under Section 564(b)(1) of the Act, 21 U.S.C. section 360bbb-3(b)(1), unless the authorization is terminated or revoked.  Performed at [facility], [address] [place], [place] [address]    Sodium 10/01/2022 136  135 - 145 mmol/L Final   Potassium 10/01/2022 3.8  3.5 - 5.1 mmol/L Final   Chloride 10/01/2022 104  98 - 111 mmol/L Final   CO2 10/01/2022 27  22 - 32 mmol/L Final   Glucose, Bld 10/01/2022 98  70 - 99 mg/dL Final   Glucose reference range applies only to samples taken after fasting for at least 8 hours.   BUN 10/01/2022 12  6 - 20 mg/dL Final   Creatinine, Ser 10/01/2022 0.98  0.44 - 1.00 mg/dL Final   Calcium 60/45/4098 8.9  8.9 - 10.3 mg/dL Final   GFR, Estimated 10/01/2022 >60  >60 mL/min Final   Comment: (NOTE) Calculated using the CKD-EPI Creatinine Equation (2021)    Anion gap 10/01/2022 5  5 - 15 Final   Performed at [facility], [address], [place], [place] [address]   WBC 10/01/2022 5.1  4.0 - 10.5 K/uL Final   RBC 10/01/2022 4.31  3.87 - 5.11 MIL/uL Final   Hemoglobin 10/01/2022 12.7  12.0 - 15.0 g/dL Final   HCT 78/29/5621 38.8  36.0 - 46.0 % Final   MCV 10/01/2022 90.0  80.0 - 100.0 fL Final   MCH 10/01/2022 29.5   26.0 - 34.0 pg Final   MCHC 10/01/2022 32.7  30.0 - 36.0 g/dL Final   RDW 30/86/5784 12.4  11.5 - 15.5 % Final   Platelets 10/01/2022 300  150 - 400 K/uL Final   nRBC 10/01/2022 0.0  0.0 - 0.2 % Final   Performed at [facility], [address] [place], [place] [address]   Lipase 10/01/2022 29  11 - 51 U/L Final   Performed at [facility], [address] [place], [place] [address]   Color, Urine 10/05/2022 YELLOW  YELLOW Final   APPearance 10/05/2022 HAZY (A)  CLEAR Final   Specific Gravity, Urine 10/05/2022 1.018  1.005 - 1.030 Final   pH 10/05/2022 5.0  5.0 - 8.0 Final   Glucose, UA 10/05/2022 NEGATIVE  NEGATIVE mg/dL Final   Hgb urine dipstick 10/05/2022 NEGATIVE  NEGATIVE Final   Bilirubin Urine 10/05/2022 NEGATIVE  NEGATIVE Final   Ketones, ur 10/05/2022 NEGATIVE  NEGATIVE mg/dL Final   Protein, ur 32/44/0102 NEGATIVE  NEGATIVE mg/dL  Final   Nitrite 10/05/2022 NEGATIVE  NEGATIVE Final   Leukocytes,Ua 10/05/2022 TRACE (A)  NEGATIVE Final   RBC / HPF 10/05/2022 0-5  0 - 5 RBC/hpf Final   WBC, UA 10/05/2022 0-5  0 - 5 WBC/hpf Final   Bacteria, UA 10/05/2022 RARE (A)  NONE SEEN Final   Squamous Epithelial / LPF 10/05/2022 0-5  0 - 5 Final   Mucus 10/05/2022 PRESENT   Final   Performed at [facility], [place] [address], [place], [place] [address]   Specimen Description 10/05/2022 URINE, CLEAN CATCH   Final   Special Requests 10/05/2022    Final                   Value:NONE Performed at [facility], [place] [address] [place], [place] [address]    Culture 10/05/2022 10,000 COLONIES/mL MULTIPLE SPECIES PRESENT, SUGGEST RECOLLECTION (A)   Final   Report Status 10/05/2022 10/07/2022 FINAL   Final  Admission on 07/04/2022, Discharged on 07/05/2022  Component Date Value Ref Range Status   Sodium 07/04/2022 142  135 - 145 mmol/L Final   Potassium 07/04/2022 4.4  3.5 - 5.1 mmol/L Final   Chloride 07/04/2022 109  98 - 111 mmol/L Final   CO2 07/04/2022 26  22 - 32  mmol/L Final   Glucose, Bld 07/04/2022 96  70 - 99 mg/dL Final   Glucose reference range applies only to samples taken after fasting for at least 8 hours.   BUN 07/04/2022 15  6 - 20 mg/dL Final   Creatinine, Ser 07/04/2022 0.83  0.44 - 1.00 mg/dL Final   Calcium 07/04/2022 9.3  8.9 - 10.3 mg/dL Final   Total Protein 07/04/2022 7.2  6.5 - 8.1 g/dL Final   Albumin 07/04/2022 3.9  3.5 - 5.0 g/dL Final   AST 07/04/2022 23  15 - 41 U/L Final   ALT 07/04/2022 28  0 - 44 U/L Final   Alkaline Phosphatase 07/04/2022 77  38 - 126 U/L Final   Total Bilirubin 07/04/2022 0.4  0.3 - 1.2 mg/dL Final   GFR, Estimated 07/04/2022 >60  >60 mL/min Final   Comment: (NOTE) Calculated using the CKD-EPI Creatinine Equation (2021)    Anion gap 07/04/2022 7  5 - 15 Final   Performed at [facility], [place] [address] [place], [place] [address]   Alcohol, Ethyl (B) 07/04/2022 <10  <10 mg/dL Final   Comment: (NOTE) Lowest detectable limit for serum alcohol is 10 mg/dL.  For medical purposes only. Performed at [facility], [place] [address] [place], [place] [address]    WBC 07/04/2022 7.0  4.0 - 10.5 K/uL Final   RBC 07/04/2022 4.18  3.87 - 5.11 MIL/uL Final   Hemoglobin 07/04/2022 12.8  12.0 - 15.0 g/dL Final   HCT 07/04/2022 39.1  36.0 - 46.0 % Final   MCV 07/04/2022 93.5  80.0 - 100.0 fL Final   MCH 07/04/2022 30.6  26.0 - 34.0 pg Final   MCHC 07/04/2022 32.7  30.0 - 36.0 g/dL Final   RDW 07/04/2022 12.9  11.5 - 15.5 % Final   Platelets 07/04/2022 243  150 - 400 K/uL Final   nRBC 07/04/2022 0.0  0.0 - 0.2 % Final   Neutrophils Relative % 07/04/2022 43  % Final   Neutro Abs 07/04/2022 3.0  1.7 - 7.7 K/uL Final   Lymphocytes Relative 07/04/2022 50  % Final   Lymphs Abs 07/04/2022 3.5  0.7 -  4.0 K/uL Final   Monocytes Relative 07/04/2022 7  % Final   Monocytes Absolute 07/04/2022 0.5  0.1 - 1.0 K/uL Final   Eosinophils Relative 07/04/2022 0  % Final   Eosinophils  Absolute 07/04/2022 0.0  0.0 - 0.5 K/uL Final   Basophils Relative 07/04/2022 0  % Final   Basophils Absolute 07/04/2022 0.0  0.0 - 0.1 K/uL Final   Immature Granulocytes 07/04/2022 0  % Final   Abs Immature Granulocytes 07/04/2022 0.01  0.00 - 0.07 K/uL Final   Performed at [facility], [address] [place], [place] [address]   I-stat hCG, quantitative 07/04/2022 <5.0  <5 mIU/mL Final   Comment 3 07/04/2022          Final   Comment:   GEST. AGE      CONC.  (mIU/mL)   <=1 WEEK        5 - 50     2 WEEKS       50 - 500     3 WEEKS       100 - 10,000     4 WEEKS     1,000 - 30,000        FEMALE AND NON-PREGNANT FEMALE:     LESS THAN 5 mIU/mL   Admission on 06/14/2022, Discharged on 06/14/2022  Component Date Value Ref Range Status   Color, UA 06/14/2022 yellow  yellow Final   Clarity, UA 06/14/2022 cloudy (A)  clear Final   Glucose, UA 06/14/2022 negative  negative mg/dL Final   Bilirubin, UA 60/45/4098 negative  negative Final   Ketones, POC UA 06/14/2022 negative  negative mg/dL Final   Spec Grav, UA 11/91/4782 1.025  1.010 - 1.025 Final   Blood, UA 06/14/2022 negative  negative Final   pH, UA 06/14/2022 7.5  5.0 - 8.0 Final   Protein Ur, POC 06/14/2022 =30 (A)  negative mg/dL Final   Urobilinogen, UA 06/14/2022 0.2  0.2 or 1.0 E.U./dL Final   Nitrite, UA 95/62/1308 Negative  Negative Final   Leukocytes, UA 06/14/2022 Negative  Negative Final   Preg Test, Ur 06/14/2022 Negative  Negative Final   Specimen Description 06/14/2022 URINE, CLEAN CATCH   Final   Special Requests 06/14/2022    Final                   Value:NONE Performed at [facility], [address], [place], [place] [address]    Culture 06/14/2022 MULTIPLE SPECIES PRESENT, SUGGEST RECOLLECTION (A)   Final   Report Status 06/14/2022 06/16/2022 FINAL   Final  Admission on 06/07/2022, Discharged on 06/10/2022  Component Date Value Ref Range Status   SARS Coronavirus 2 by RT PCR 06/07/2022  NEGATIVE  NEGATIVE Final   Comment: (NOTE) SARS-CoV-2 target nucleic acids are NOT DETECTED.  The SARS-CoV-2 RNA is generally detectable in upper respiratory specimens during the acute phase of infection. The lowest concentration of SARS-CoV-2 viral copies this assay can detect is 138 copies/mL. A negative result does not preclude SARS-Cov-2 infection and should not be used as the sole basis for treatment or other patient management decisions. A negative result may occur with  improper specimen collection/handling, submission of specimen other than nasopharyngeal swab, presence of viral mutation(s) within the areas targeted by this assay, and inadequate number of viral copies(<138 copies/mL). A negative result must be combined with clinical observations, patient history, and epidemiological information. The expected result is Negative.  Fact Sheet for Patients:  BloggerCourse.com  Fact Sheet for Healthcare Providers:  SeriousBroker.it  This test is no                          t yet approved or cleared by the [place] FDA and  has been authorized for detection and/or diagnosis of SARS-CoV-2 by FDA under an Emergency Use Authorization (EUA). This EUA will remain  in effect (meaning this test can be used) for the duration of the COVID-19 declaration under Section 564(b)(1) of the Act, 21 U.S.C.section 360bbb-3(b)(1), unless the authorization is terminated  or revoked sooner.       Influenza A by PCR 06/07/2022 NEGATIVE  NEGATIVE Final   Influenza B by PCR 06/07/2022 NEGATIVE  NEGATIVE Final   Comment: (NOTE) The Xpert Xpress SARS-CoV-2/FLU/RSV plus assay is intended as an aid in the diagnosis of influenza from Nasopharyngeal swab specimens and should not be used as a sole basis for treatment. Nasal washings and aspirates are unacceptable for Xpert Xpress SARS-CoV-2/FLU/RSV testing.  Fact Sheet for  Patients: BloggerCourse.com  Fact Sheet for Healthcare Providers: SeriousBroker.it  This test is not yet approved or cleared by the [place] FDA and has been authorized for detection and/or diagnosis of SARS-CoV-2 by FDA under an Emergency Use Authorization (EUA). This EUA will remain in effect (meaning this test can be used) for the duration of the COVID-19 declaration under Section 564(b)(1) of the Act, 21 U.S.C. section 360bbb-3(b)(1), unless the authorization is terminated or revoked.  Performed at [facility], [address], [place], [place] [address]    WBC 06/07/2022 5.7  4.0 - 10.5 K/uL Final   RBC 06/07/2022 4.39  3.87 - 5.11 MIL/uL Final   Hemoglobin 06/07/2022 13.2  12.0 - 15.0 g/dL Final   HCT 86/57/8469 40.4  36.0 - 46.0 % Final   MCV 06/07/2022 92.0  80.0 - 100.0 fL Final   MCH 06/07/2022 30.1  26.0 - 34.0 pg Final   MCHC 06/07/2022 32.7  30.0 - 36.0 g/dL Final   RDW 62/95/2841 12.4  11.5 - 15.5 % Final   Platelets 06/07/2022 308  150 - 400 K/uL Final   nRBC 06/07/2022 0.0  0.0 - 0.2 % Final   Neutrophils Relative % 06/07/2022 42  % Final   Neutro Abs 06/07/2022 2.4  1.7 - 7.7 K/uL Final   Lymphocytes Relative 06/07/2022 54  % Final   Lymphs Abs 06/07/2022 3.1  0.7 - 4.0 K/uL Final   Monocytes Relative 06/07/2022 4  % Final   Monocytes Absolute 06/07/2022 0.2  0.1 - 1.0 K/uL Final   Eosinophils Relative 06/07/2022 0  % Final   Eosinophils Absolute 06/07/2022 0.0  0.0 - 0.5 K/uL Final   Basophils Relative 06/07/2022 0  % Final   Basophils Absolute 06/07/2022 0.0  0.0 - 0.1 K/uL Final   Immature Granulocytes 06/07/2022 0  % Final   Abs Immature Granulocytes 06/07/2022 0.01  0.00 - 0.07 K/uL Final   Performed at [facility], [address] [place], [place] [address]   Sodium 06/07/2022 139  135 - 145 mmol/L Final   Potassium 06/07/2022 4.0  3.5 - 5.1 mmol/L Final   Chloride 06/07/2022 104  98 -  111 mmol/L Final   CO2 06/07/2022 28  22 - 32 mmol/L Final   Glucose, Bld 06/07/2022 104 (H)  70 - 99 mg/dL Final   Glucose reference range applies only to samples taken after fasting for at least 8  hours.   BUN 06/07/2022 8  6 - 20 mg/dL Final   Creatinine, Ser 06/07/2022 0.84  0.44 - 1.00 mg/dL Final   Calcium 16/07/9603 9.1  8.9 - 10.3 mg/dL Final   Total Protein 54/06/8118 6.9  6.5 - 8.1 g/dL Final   Albumin 14/78/2956 3.7  3.5 - 5.0 g/dL Final   AST 21/30/8657 19  15 - 41 U/L Final   ALT 06/07/2022 22  0 - 44 U/L Final   Alkaline Phosphatase 06/07/2022 54  38 - 126 U/L Final   Total Bilirubin 06/07/2022 0.4  0.3 - 1.2 mg/dL Final   GFR, Estimated 06/07/2022 >60  >60 mL/min Final   Comment: (NOTE) Calculated using the CKD-EPI Creatinine Equation (2021)    Anion gap 06/07/2022 7  5 - 15 Final   Performed at [facility], [address], [place], [place] [address]   Hgb A1c MFr Bld 06/07/2022 5.0  4.8 - 5.6 % Final   Comment: (NOTE) Pre diabetes:          5.7%-6.4%  Diabetes:              >6.4%  Glycemic control for   <7.0% adults with diabetes    Mean Plasma Glucose 06/07/2022 96.8  mg/dL Final   Performed at [facility], [address], [place], [place] [address]   TSH 06/07/2022 1.620  0.350 - 4.500 uIU/mL Final   Comment: Performed by a 3rd Generation assay with a functional sensitivity of <=0.01 uIU/mL. Performed at [facility], [address] [place], [place] [address]    RPR Ser Ql 06/07/2022 NON REACTIVE  NON REACTIVE Final   Performed at [facility], [address] [place], [place] [address]   Color, Urine 06/07/2022 YELLOW  YELLOW Final   APPearance 06/07/2022 HAZY (A)  CLEAR Final   Specific Gravity, Urine 06/07/2022 1.018  1.005 - 1.030 Final   pH 06/07/2022 7.0  5.0 - 8.0 Final   Glucose, UA 06/07/2022 NEGATIVE  NEGATIVE mg/dL Final   Hgb urine dipstick 06/07/2022 NEGATIVE  NEGATIVE Final   Bilirubin Urine 06/07/2022 NEGATIVE   NEGATIVE Final   Ketones, ur 06/07/2022 NEGATIVE  NEGATIVE mg/dL Final   Protein, ur 72/53/6644 NEGATIVE  NEGATIVE mg/dL Final   Nitrite 03/47/4259 NEGATIVE  NEGATIVE Final   Leukocytes,Ua 06/07/2022 NEGATIVE  NEGATIVE Final   Performed at [facility], [address] [place], [place] [address]   Cholesterol 06/07/2022 164  0 - 200 mg/dL Final   Triglycerides 56/43/3295 158 (H)  <150 mg/dL Final   HDL 18/84/1660 44  >40 mg/dL Final   Total CHOL/HDL Ratio 06/07/2022 3.7  RATIO Final   VLDL 06/07/2022 32  0 - 40 mg/dL Final   LDL Cholesterol 06/07/2022 88  0 - 99 mg/dL Final   Comment:        Total Cholesterol/HDL:CHD Risk Coronary Heart Disease Risk Table                     Men   Women  1/2 Average Risk   3.4   3.3  Average Risk       5.0   4.4  2 X Average Risk   9.6   7.1  3 X Average Risk  23.4   11.0        Use the calculated Patient Ratio above and the CHD Risk Table to determine the patient's CHD Risk.  ATP III CLASSIFICATION (LDL):  <100     mg/dL   Optimal  409-811  mg/dL   Near or Above                    Optimal  130-159  mg/dL   Borderline  914-782  mg/dL   High  >956     mg/dL   Very High Performed at [facility], [address], [place], [place] [address]    HIV Screen 4th Generation wRfx 06/07/2022 Non Reactive  Non Reactive Final   Performed at [facility], [address], [place], [place] [address]   SARSCOV2ONAVIRUS 2 AG 06/07/2022 NEGATIVE  NEGATIVE Final   Comment: (NOTE) SARS-CoV-2 antigen NOT DETECTED.   Negative results are presumptive.  Negative results do not preclude SARS-CoV-2 infection and should not be used as the sole basis for treatment or other patient management decisions, including infection  control decisions, particularly in the presence of clinical signs and  symptoms consistent with COVID-19, or in those who have been in contact with the virus.  Negative results must be combined with clinical observations,  patient history, and epidemiological information. The expected result is Negative.  Fact Sheet for Patients: https://www.jennings-kim.com/  Fact Sheet for Healthcare Providers: https://alexander-rogers.biz/  This test is not yet approved or cleared by the [place] FDA and  has been authorized for detection and/or diagnosis of SARS-CoV-2 by FDA under an Emergency Use Authorization (EUA).  This EUA will remain in effect (meaning this test can be used) for the duration of  the COV                          ID-19 declaration under Section 564(b)(1) of the Act, 21 U.S.C. section 360bbb-3(b)(1), unless the authorization is terminated or revoked sooner.     POC Amphetamine UR 06/07/2022 None Detected  NONE DETECTED (Cut Off Level 1000 ng/mL) Final   POC Secobarbital (BAR) 06/07/2022 None Detected  NONE DETECTED (Cut Off Level 300 ng/mL) Final   POC Buprenorphine (BUP) 06/07/2022 None Detected  NONE DETECTED (Cut Off Level 10 ng/mL) Final   POC Oxazepam (BZO) 06/07/2022 None Detected  NONE DETECTED (Cut Off Level 300 ng/mL) Final   POC Cocaine UR 06/07/2022 None Detected  NONE DETECTED (Cut Off Level 300 ng/mL) Final   POC Methamphetamine UR 06/07/2022 None Detected  NONE DETECTED (Cut Off Level 1000 ng/mL) Final   POC Morphine 06/07/2022 None Detected  NONE DETECTED (Cut Off Level 300 ng/mL) Final   POC Methadone UR 06/07/2022 None Detected  NONE DETECTED (Cut Off Level 300 ng/mL) Final   POC Oxycodone UR 06/07/2022 None Detected  NONE DETECTED (Cut Off Level 100 ng/mL) Final   POC Marijuana UR 06/07/2022 None Detected  NONE DETECTED (Cut Off Level 50 ng/mL) Final   Preg Test, Ur 06/08/2022 NEGATIVE  NEGATIVE Final   Comment:        THE SENSITIVITY OF THIS METHODOLOGY IS >20 mIU/mL. Performed at [facility], [address] [place], [place] [address]    Preg Test, Ur 06/08/2022 NEGATIVE  NEGATIVE Final   Comment:        THE SENSITIVITY OF  THIS METHODOLOGY IS >24 mIU/mL     Blood Alcohol level:  Lab Results  Component Value Date   ETH <10 07/04/2022   ETH <10 02/07/2022    Metabolic Disorder Labs: Lab Results  Component Value Date   HGBA1C  5.7 (H) 10/15/2022   MPG 117 10/15/2022   MPG 96.8 06/07/2022   No results found for: "PROLACTIN" Lab Results  Component Value Date   CHOL 181 07/25/2022   TRIG 118 07/25/2022   HDL 45 07/25/2022   CHOLHDL 4.0 07/25/2022   VLDL 24 07/25/2022   LDLCALC 112 (H) 07/25/2022   LDLCALC 88 06/07/2022    Therapeutic Lab Levels: No results found for: "LITHIUM" No results found for: "VALPROATE" No results found for: "CBMZ"  Physical Findings   GAD-7    Flowsheet Row Office Visit from 12/17/2021 in [facility]  Total GAD-7 Score 17      PHQ2-9    Flowsheet Row ED from 06/07/2022 in [facility] Office Visit from 12/17/2021 in [facility]  PHQ-2 Total Score 1 2  PHQ-9 Total Score 2 8      Flowsheet Row ED from 10/07/2022 in [facility] Most recent reading at 10/07/2022  8:16 PM ED from 10/07/2022 in [facility] EMERGENCY DEPARTMENT Most recent reading at 10/07/2022 12:18 PM ED from 07/25/2022 in [facility] [place] Most recent reading at 10/06/2022  9:38 AM  C-SSRS RISK CATEGORY No Risk No Risk No Risk        Musculoskeletal  Strength & Muscle Tone: within normal limits Gait & Station: normal Patient leans: N/A  Psychiatric Specialty Exam  Presentation  General Appearance:  Appropriate for Environment  Eye Contact: Good  Speech: Clear and Coherent; Normal Rate  Speech Volume: Normal  Handedness: Right   Mood and Affect  Mood: Euthymic  Affect: Appropriate; Congruent   Thought Process  Thought Processes: Coherent; Goal Directed  Descriptions of Associations:Intact  Orientation:Full (Time,  Place and Person)  Thought Content:Logical; WDL  Diagnosis of Schizophrenia or Schizoaffective disorder in past: No    Hallucinations:Hallucinations: None  Ideas of Reference:None  Suicidal Thoughts:Suicidal Thoughts: No  Homicidal Thoughts:Homicidal Thoughts: No   Sensorium  Memory: Immediate Good; Recent Good  Judgment: Intact  Insight: Present   Executive Functions  Concentration: Good  Attention Span: Good  Recall: Good  Fund of Knowledge: Good  Language: Good   Psychomotor Activity  Psychomotor Activity: Psychomotor Activity: Normal   Assets  Assets: Communication Skills; Desire for Improvement; Leisure Time   Sleep  Sleep: Sleep: Good   Nutritional Assessment (For [facility] and [facility] admissions only) Has the patient had a weight loss or gain of 10 pounds or more in the last 3 months?: No Has the patient had a decrease in food intake/or appetite?: No Does the patient have dental problems?: No Does the patient have eating habits or behaviors that may be indicators of an eating disorder including binging or inducing vomiting?: No Has the patient recently lost weight without trying?: 0 Has the patient been eating poorly because of a decreased appetite?: 0 Malnutrition Screening Tool Score: 0    Physical Exam  Physical Exam Vitals and nursing note reviewed. Exam conducted with a chaperone present.  Constitutional:      General: She is not in acute distress.    Appearance: Normal appearance. She is not ill-appearing.  HENT:     Head: Normocephalic.     Right Ear: Hearing normal. Tenderness present. There is impacted cerumen. Tympanic membrane is erythematous.     Left Ear: Hearing normal. Tenderness present. There is impacted cerumen. Tympanic membrane is erythematous.     Ears:  Comments: Debrox ordered last night and some softening of cerumen.  Was able to visualize TM this morning 10/16/22 Eyes:     Conjunctiva/sclera: Conjunctivae  normal.  Cardiovascular:     Rate and Rhythm: Normal rate.  Pulmonary:     Effort: Pulmonary effort is normal.  Musculoskeletal:        General: Normal range of motion.     Cervical back: Normal range of motion.  Skin:    General: Skin is warm and dry.  Neurological:     Mental Status: She is alert and oriented to person, place, and time.    Review of Systems  Constitutional: Negative.   HENT:  Positive for ear pain. Negative for sore throat.   Respiratory:  Negative for cough, shortness of breath and wheezing.   Cardiovascular:  Negative for chest pain and palpitations.  Gastrointestinal: Negative.   Genitourinary: Negative.   Musculoskeletal: Negative.   Skin: Negative.   Neurological: Negative.   Psychiatric/Behavioral:  Negative for depression, hallucinations, substance abuse and suicidal ideas. The patient is not nervous/anxious and does not have insomnia.    Blood pressure (!) 115/94, pulse (!) 105, temperature 98.2 F (36.8 C), temperature source Oral, resp. rate 16, height 5\' 1"  (1.549 m), weight 198 lb (89.8 kg), SpO2 99 %. Body mass index is 37.41 kg/m.  Treatment Plan Summary: Patient continues to be psychiatrically cleared.  Will continue to collaborated with social work/TOC and legal guardian for appropriate residential facility.    Per OBS Care Management (team meeting held today 10/16/2022):  There is a projected date of October 18, 2022 at 12 noon that the patient will discharge from the [facility] and safely transition to [facility].    Jonne Rote, NP 10/16/2022 11:49 AM

## 2022-10-16 NOTE — Progress Notes (Signed)
Pt is asleep. Respirations are even and unlabored. No distress noted. Staff will monitor for pt's safety. 

## 2022-10-16 NOTE — Telephone Encounter (Signed)
        Patient  visited [facility] on 10/07/2022  for uti  Patient phone not on   [facility] [phone] [address] , [place] [address] Email : [email or web address]

## 2022-10-16 NOTE — Progress Notes (Signed)
Pt is awake, alert and oriented X4. Pt did not voice any complaints of pain or discomfort. No distress noted. Administered scheduled meds with no issue. Pt denies current SI/HI/AVH, plan or intent. Staff will monitor for pt's safety. 

## 2022-10-16 NOTE — ED Notes (Signed)
Pt sitting on bed watching television and talking with another pt on unit. Pt calm and cooperative. No c/o pain or distress. Will continue to monitor for safety

## 2022-10-17 DIAGNOSIS — F209 Schizophrenia, unspecified: Secondary | ICD-10-CM | POA: Diagnosis not present

## 2022-10-17 DIAGNOSIS — E039 Hypothyroidism, unspecified: Secondary | ICD-10-CM | POA: Diagnosis not present

## 2022-10-17 DIAGNOSIS — E118 Type 2 diabetes mellitus with unspecified complications: Secondary | ICD-10-CM | POA: Diagnosis not present

## 2022-10-17 DIAGNOSIS — N390 Urinary tract infection, site not specified: Secondary | ICD-10-CM | POA: Diagnosis not present

## 2022-10-17 DIAGNOSIS — N9489 Other specified conditions associated with female genital organs and menstrual cycle: Secondary | ICD-10-CM | POA: Diagnosis not present

## 2022-10-17 DIAGNOSIS — F419 Anxiety disorder, unspecified: Secondary | ICD-10-CM | POA: Diagnosis not present

## 2022-10-17 DIAGNOSIS — Z1152 Encounter for screening for COVID-19: Secondary | ICD-10-CM | POA: Diagnosis not present

## 2022-10-17 DIAGNOSIS — F17200 Nicotine dependence, unspecified, uncomplicated: Secondary | ICD-10-CM | POA: Diagnosis not present

## 2022-10-17 DIAGNOSIS — F333 Major depressive disorder, recurrent, severe with psychotic symptoms: Secondary | ICD-10-CM | POA: Diagnosis not present

## 2022-10-17 MED ORDER — CARBAMIDE PEROXIDE 6.5 % OT SOLN
5.0000 [drp] | Freq: Two times a day (BID) | OTIC | Status: AC
  Administered 2022-10-17 – 2022-10-20 (×6): 5 [drp] via OTIC

## 2022-10-17 MED ORDER — SERTRALINE HCL 50 MG PO TABS
150.0000 mg | ORAL_TABLET | Freq: Every day | ORAL | Status: DC
  Administered 2022-10-18 – 2022-11-23 (×37): 150 mg via ORAL
  Filled 2022-10-17 (×26): qty 3
  Filled 2022-10-17: qty 21
  Filled 2022-10-17 (×12): qty 3

## 2022-10-17 NOTE — ED Notes (Signed)
Pt is currently sleeping, no distress noted, environmental check complete, will continue to monitor patient for safety. ? ?

## 2022-10-17 NOTE — ED Notes (Signed)
Patient has been given her medication, while being given her medication she asked about her ear drops stating she is still having issues with her ears, I advised I will make the provider aware.

## 2022-10-17 NOTE — ED Notes (Signed)
Pt sleeping at present, no distress noted.  Monitoring for safety. 

## 2022-10-17 NOTE — ED Notes (Signed)
Pt A&O x 4, no distress noted, taking a shower at present.  Monitoring for safety.

## 2022-10-17 NOTE — Care Management (Signed)
OBS Care Management   Writer was informed by the Care Manager with [facility]) that the [facility] is not able to take the patient for her scheduled discharge date this Friday October 18, 2022.  Writer has informed leadership that the patient will not be discharging on Friday.    Therefore there is a new plan in place for the patient as follows:  There will be an emergency meeting today October 17, 2022 at 1pm with the legal guardian, the father, myself, Mercie the [facility] in order to discuss the next steps that for a safe discharge.  [facility] is willing to take the patient, but she is not in network with [facility].   [facility] will complete a single case agreement due to the facility not being in network and an enhance rate request in order for the new [facility] to accept the patient.

## 2022-10-17 NOTE — ED Provider Notes (Signed)
Behavioral Health Progress Note  Date and Time: 10/17/2022 12:41 PM Name: Nicole [name] MRN:  [id number]  Brief HPI: Nicole [name] 32 y.o., female patient with schizophrenia, MDD with psychosis,  presented to [facility], voluntarily, as a walk-in accompanied by [employer] after a disagreement and verbal altercation with personnel at the group home.  She has been dismissed previously from other group homes due to threatening behaviors, and suicidal ideation and the one which she was residing in.  Patient was admitted to continuous assessment unit on 07/25/2022 as she also endorsed passive SI at that time along with symptoms of visual hallucination. Patient was subsequently psychiatrically cleared during her admission to the continuous assessment unit.  However patient remains here at [facility] bordering until adequate- residential placement can be obtained.     Legal Guardian: Mom Nicole [name]) transitioning to be Dad Nicole [name]) Point of contact: Dad Nicole [name])   Tests administered: Wechsler Adult Intelligence Scale-4 (see Nicole Provost, PhD consult note on 08/19/2022 for full info) Vin[place]-3 Adaptive Behavior Scales Assessment completed on 09/13/2022 administered by Nicole Sprinkles, MD   Subjective:  "My ears still hurt" Patient was initially seen resting in the recliner, no acute distress, awake.  Patient was pleasant, cooperative, and engaged with evaluation.  Patient reported that her ears are still hurting, however the Flonase did help some.  Otherwise she denied any other somatic complaints.  Reported feeling down due to possibly not being discharged due to funding.  Reported that her sleep and appetite were stable and appropriate.  Denied side effects to medication.  Patient denied SI/HI/AVH, paranoia, and contracted to safety on the unit.  Diagnosis:  Final diagnoses:  Tobacco use disorder  Schizophrenia, unspecified type (HCC)  MDD (major depressive disorder), recurrent episode, moderate (HCC)   Hypothyroidism, unspecified type    Total Time spent with patient: 15 minutes  Past Psychiatric History: schizoaffective, borderline IDD, anxiety, and MDD  Past Medical History:  Past Medical History:  Diagnosis Date   Anxiety    Depression    Hypothyroidism 08/07/2022   Tobacco use disorder 08/07/2022    Past Surgical History:  Procedure Laterality Date   WISDOM TOOTH EXTRACTION Bilateral 2020   Family History:  Family History  Problem Relation Age of Onset   Hypertension Father    Diabetes Father    Family Psychiatric  History: None reported Social History:  Social History   Substance and Sexual Activity  Alcohol Use Never     Social History   Substance and Sexual Activity  Drug Use Never    Social History   Socioeconomic History   Marital status: Single    Spouse name: Not on file   Number of children: Not on file   Years of education: Not on file   Highest education level: Not on file  Occupational History   Not on file  Tobacco Use   Smoking status: Every Day    Types: Cigarettes   Smokeless tobacco: Never  Vaping Use   Vaping Use: Some days  Substance and Sexual Activity   Alcohol use: Never   Drug use: Never   Sexual activity: Yes    Partners: Female    Birth control/protection: Implant  Other Topics Concern   Not on file  Social History Narrative   Not on file   Social Determinants of Health   Financial Resource Strain: Not on file  Food Insecurity: Not on file  Transportation Needs: Not on file  Physical Activity: Not on file  Stress: Not on file  Social Connections: Not on file   SDOH:  SDOH Screenings   Depression (PHQ2-9): Low Risk  (06/10/2022)  Tobacco Use: High Risk (10/16/2022)   Additional Social History:                          Current Medications:  Current Facility-Administered Medications  Medication Dose Route Frequency Provider Last Rate Last Admin   acetaminophen (TYLENOL) tablet 650 mg  650 mg Oral  Q6H PRN Haynes Kerns, NP       alum & mag hydroxide-simeth (MAALOX/MYLANTA) 200-200-20 MG/5ML suspension 30 mL  30 mL Oral Q4H PRN Maple Hudson, Veronique M, NP   30 mL at 10/16/22 0431   cetaphil lotion   Topical PRN Merrily Brittle, DO       fluticasone [facility]) 50 MCG/ACT nasal spray 1 spray  1 spray Each Nare QHS Merrily Brittle, DO   1 spray at 10/16/22 2136   hydrOXYzine (ATARAX) tablet 25 mg  25 mg Oral TID PRN Christene Slates, MD   25 mg at 10/16/22 2136   levothyroxine (SYNTHROID) tablet 100 mcg  100 mcg Oral Once Byungura, Veronique M, NP       levothyroxine (SYNTHROID) tablet 100 mcg  100 mcg Oral Q0600 Haynes Kerns, NP   100 mcg at 10/17/22 0612   loratadine (CLARITIN) tablet 10 mg  10 mg Oral Daily Carrion-Carrero, Caryl Ada, MD   10 mg at 10/17/22 0931   magnesium hydroxide (MILK OF MAGNESIA) suspension 30 mL  30 mL Oral Daily PRN Haynes Kerns, NP   30 mL at 10/12/22 2145   metFORMIN (GLUCOPHAGE) tablet 500 mg  500 mg Oral Q breakfast Carrion-Carrero, Caryl Ada, MD   500 mg at 10/17/22 4098   nicotine (NICODERM CQ - dosed in mg/24 hours) patch 14 mg  14 mg Transdermal Daily PRN Carrion-Carrero, Caryl Ada, MD       ondansetron (ZOFRAN-ODT) disintegrating tablet 4 mg  4 mg Oral Q8H PRN Carrion-Carrero, Margely, MD   4 mg at 10/16/22 0431   Oxcarbazepine (TRILEPTAL) tablet 300 mg  300 mg Oral BID Byungura, Veronique M, NP   300 mg at 10/17/22 0932   pantoprazole (PROTONIX) EC tablet 40 mg  40 mg Oral Daily Carrion-Carrero, Caryl Ada, MD   40 mg at 10/17/22 0931   polyethylene glycol (MIRALAX / GLYCOLAX) packet 17 g  17 g Oral Daily Merrily Brittle, DO   17 g at 10/17/22 0934   QUEtiapine (SEROQUEL) tablet 400 mg  400 mg Oral BID Maple Hudson, Veronique M, NP   400 mg at 10/17/22 0930   [START ON 10/18/2022] sertraline (ZOLOFT) tablet 150 mg  150 mg Oral Daily Carrion-Carrero, Margely, MD       traZODone (DESYREL) tablet 100 mg  100 mg Oral QHS Maple Hudson, Veronique M, NP   100  mg at 10/16/22 2138   traZODone (DESYREL) tablet 50 mg  50 mg Oral QHS PRN Haynes Kerns, NP   50 mg at 10/16/22 2137   valACYclovir (VALTREX) tablet 500 mg  500 mg Oral Daily Carrion-Carrero, Caryl Ada, MD   500 mg at 10/17/22 1191   Current Outpatient Medications  Medication Sig Dispense Refill   ABILIFY MAINTENA 400 MG PRSY prefilled syringe 400 mg every 28 (twenty-eight) days.     cetirizine (ZYRTEC) 10 MG tablet Take 10 mg by mouth daily.     cyclobenzaprine (FLEXERIL) 10 MG tablet Take 1 tablet (10 mg total) by mouth 2 (  two) times daily as needed for muscle spasms. 20 tablet 0   fluticasone (FLONASE) 50 MCG/ACT nasal spray Place 1 spray into both nostrils daily.     meloxicam (MOBIC) 15 MG tablet Take 15 mg by mouth daily.     nitrofurantoin, macrocrystal-monohydrate, (MACROBID) 100 MG capsule Take 1 capsule (100 mg total) by mouth 2 (two) times daily. 10 capsule 0   ondansetron (ZOFRAN-ODT) 4 MG disintegrating tablet Take 1 tablet (4 mg total) by mouth every 8 (eight) hours as needed for nausea or vomiting. 20 tablet 0   Oxcarbazepine (TRILEPTAL) 300 MG tablet Take 1 tablet (300 mg total) by mouth 2 (two) times daily. 60 tablet 0   QUEtiapine (SEROQUEL) 400 MG tablet Take 1 tablet (400 mg total) by mouth 2 (two) times daily. 60 tablet 0   sertraline (ZOLOFT) 50 MG tablet Take 3 tablets (150 mg total) by mouth in the morning. 90 tablet 0   traZODone (DESYREL) 100 MG tablet Take 1 tablet (100 mg total) by mouth at bedtime. 30 tablet 0   valACYclovir (VALTREX) 500 MG tablet Take 500 mg by mouth daily.      Labs  Lab Results:  Admission on 10/07/2022  Component Date Value Ref Range Status   Hgb A1c MFr Bld 10/15/2022 5.7 (H)  4.8 - 5.6 % Final   Comment: (NOTE)         Prediabetes: 5.7 - 6.4         Diabetes: >6.4         Glycemic control for adults with diabetes: <7.0    Mean Plasma Glucose 10/15/2022 117  mg/dL Final   Comment: (NOTE) Performed At: [facility] [address] [place], [place] [address] Nicole Schimke MD OH:[id number]   Admission on 10/07/2022, Discharged on 10/07/2022  Component Date Value Ref Range Status   Color, Urine 10/07/2022 YELLOW  YELLOW Final   APPearance 10/07/2022 HAZY (A)  CLEAR Final   Specific Gravity, Urine 10/07/2022 1.011  1.005 - 1.030 Final   pH 10/07/2022 7.0  5.0 - 8.0 Final   Glucose, UA 10/07/2022 NEGATIVE  NEGATIVE mg/dL Final   Hgb urine dipstick 10/07/2022 SMALL (A)  NEGATIVE Final   Bilirubin Urine 10/07/2022 NEGATIVE  NEGATIVE Final   Ketones, ur 10/07/2022 NEGATIVE  NEGATIVE mg/dL Final   Protein, ur 94/85/4627 NEGATIVE  NEGATIVE mg/dL Final   Nitrite 03/50/0938 NEGATIVE  NEGATIVE Final   Leukocytes,Ua 10/07/2022 LARGE (A)  NEGATIVE Final   RBC / HPF 10/07/2022 0-5  0 - 5 RBC/hpf Final   WBC, UA 10/07/2022 0-5  0 - 5 WBC/hpf Final   Bacteria, UA 10/07/2022 FEW (A)  NONE SEEN Final   Squamous Epithelial / HPF 10/07/2022 11-20  0 - 5 Final   Performed at [facility], [address], [place], [place] [address]   I-stat hCG, quantitative 10/07/2022 <5.0  <5 mIU/mL Final   Comment 3 10/07/2022          Final   Comment:   GEST. AGE      CONC.  (mIU/mL)   <=1 WEEK        5 - 50     2 WEEKS       50 - 500     3 WEEKS       100 - 10,000     4 WEEKS     1,000 - 30,000        FEMALE AND NON-PREGNANT FEMALE:     LESS  THAN 5 mIU/mL    Sodium 10/07/2022 138  135 - 145 mmol/L Final   Potassium 10/07/2022 4.3  3.5 - 5.1 mmol/L Final   Chloride 10/07/2022 104  98 - 111 mmol/L Final   CO2 10/07/2022 28  22 - 32 mmol/L Final   Glucose, Bld 10/07/2022 90  70 - 99 mg/dL Final   Glucose reference range applies only to samples taken after fasting for at least 8 hours.   BUN 10/07/2022 8  6 - 20 mg/dL Final   Creatinine, Ser 10/07/2022 0.96  0.44 - 1.00 mg/dL Final   Calcium 04/54/098112/25/2023 9.1  8.9 - 10.3 mg/dL Final   GFR, Estimated 10/07/2022 >60  >60 mL/min Final   Comment: (NOTE) Calculated  using the CKD-EPI Creatinine Equation (2021)    Anion gap 10/07/2022 6  5 - 15 Final   Performed at [facility], [address] [place], [place] [address]   WBC 10/07/2022 5.8  4.0 - 10.5 K/uL Final   RBC 10/07/2022 4.36  3.87 - 5.11 MIL/uL Final   Hemoglobin 10/07/2022 12.9  12.0 - 15.0 g/dL Final   HCT 82/95/621312/25/2023 40.0  36.0 - 46.0 % Final   MCV 10/07/2022 91.7  80.0 - 100.0 fL Final   MCH 10/07/2022 29.6  26.0 - 34.0 pg Final   MCHC 10/07/2022 32.3  30.0 - 36.0 g/dL Final   RDW 08/65/784612/25/2023 12.4  11.5 - 15.5 % Final   Platelets 10/07/2022 295  150 - 400 K/uL Final   nRBC 10/07/2022 0.0  0.0 - 0.2 % Final   Performed at [facility], [address] [place], [place] [address]   SARS Coronavirus 2 by RT PCR 10/07/2022 NEGATIVE  NEGATIVE Final   Comment: (NOTE) SARS-CoV-2 target nucleic acids are NOT DETECTED.  The SARS-CoV-2 RNA is generally detectable in upper respiratory specimens during the acute phase of infection. The lowest concentration of SARS-CoV-2 viral copies this assay can detect is 138 copies/mL. A negative result does not preclude SARS-Cov-2 infection and should not be used as the sole basis for treatment or other patient management decisions. A negative result may occur with  improper specimen collection/handling, submission of specimen other than nasopharyngeal swab, presence of viral mutation(s) within the areas targeted by this assay, and inadequate number of viral copies(<138 copies/mL). A negative result must be combined with clinical observations, patient history, and epidemiological information. The expected result is Negative.  Fact Sheet for Patients:  BloggerCourse.comhttps://www.fda.gov/media/152166/download  Fact Sheet for Healthcare Providers:  SeriousBroker.ithttps://www.fda.gov/media/152162/download  This test is no                          t yet approved or cleared by the [place] FDA and  has been authorized for detection and/or diagnosis of SARS-CoV-2 by FDA  under an Emergency Use Authorization (EUA). This EUA will remain  in effect (meaning this test can be used) for the duration of the COVID-19 declaration under Section 564(b)(1) of the Act, 21 U.S.C.section 360bbb-3(b)(1), unless the authorization is terminated  or revoked sooner.       Influenza A by PCR 10/07/2022 NEGATIVE  NEGATIVE Final   Influenza B by PCR 10/07/2022 NEGATIVE  NEGATIVE Final   Comment: (NOTE) The Xpert Xpress SARS-CoV-2/FLU/RSV plus assay is intended as an aid in the diagnosis of influenza from Nasopharyngeal swab specimens and should not be used as a sole basis for treatment. Nasal washings and aspirates are unacceptable for Xpert  Xpress SARS-CoV-2/FLU/RSV testing.  Fact Sheet for Patients: BloggerCourse.com  Fact Sheet for Healthcare Providers: SeriousBroker.it  This test is not yet approved or cleared by the [place] FDA and has been authorized for detection and/or diagnosis of SARS-CoV-2 by FDA under an Emergency Use Authorization (EUA). This EUA will remain in effect (meaning this test can be used) for the duration of the COVID-19 declaration under Section 564(b)(1) of the Act, 21 U.S.C. section 360bbb-3(b)(1), unless the authorization is terminated or revoked.     Resp Syncytial Virus by PCR 10/07/2022 NEGATIVE  NEGATIVE Final   Comment: (NOTE) Fact Sheet for Patients: BloggerCourse.com  Fact Sheet for Healthcare Providers: SeriousBroker.it  This test is not yet approved or cleared by the [place] FDA and has been authorized for detection and/or diagnosis of SARS-CoV-2 by FDA under an Emergency Use Authorization (EUA). This EUA will remain in effect (meaning this test can be used) for the duration of the COVID-19 declaration under Section 564(b)(1) of the Act, 21 U.S.C. section 360bbb-3(b)(1), unless the authorization is terminated  or revoked.  Performed at [facility], [address] [place], [place] [address]    Specimen Description 10/07/2022 URINE, CLEAN CATCH   Final   Special Requests 10/07/2022    Final                   Value:NONE Performed at [facility], [address], [place], [place] [address]    Culture 10/07/2022 MULTIPLE SPECIES PRESENT, SUGGEST RECOLLECTION (A)   Final   Report Status 10/07/2022 10/08/2022 FINAL   Final  Admission on 07/25/2022, Discharged on 10/07/2022  Component Date Value Ref Range Status   SARS Coronavirus 2 by RT PCR 07/25/2022 NEGATIVE  NEGATIVE Final   Comment: (NOTE) SARS-CoV-2 target nucleic acids are NOT DETECTED.  The SARS-CoV-2 RNA is generally detectable in upper respiratory specimens during the acute phase of infection. The lowest concentration of SARS-CoV-2 viral copies this assay can detect is 138 copies/mL. A negative result does not preclude SARS-Cov-2 infection and should not be used as the sole basis for treatment or other patient management decisions. A negative result may occur with  improper specimen collection/handling, submission of specimen other than nasopharyngeal swab, presence of viral mutation(s) within the areas targeted by this assay, and inadequate number of viral copies(<138 copies/mL). A negative result must be combined with clinical observations, patient history, and epidemiological information. The expected result is Negative.  Fact Sheet for Patients:  BloggerCourse.com  Fact Sheet for Healthcare Providers:  SeriousBroker.it  This test is no                          t yet approved or cleared by the [place] FDA and  has been authorized for detection and/or diagnosis of SARS-CoV-2 by FDA under an Emergency Use Authorization (EUA). This EUA will remain  in effect (meaning this test can be used) for the duration of the COVID-19 declaration under Section 564(b)(1)  of the Act, 21 U.S.C.section 360bbb-3(b)(1), unless the authorization is terminated  or revoked sooner.       Influenza A by PCR 07/25/2022 NEGATIVE  NEGATIVE Final   Influenza B by PCR 07/25/2022 NEGATIVE  NEGATIVE Final   Comment: (NOTE) The Xpert Xpress SARS-CoV-2/FLU/RSV plus assay is intended as an aid in the diagnosis of influenza from Nasopharyngeal swab specimens and should not be used as a sole basis for treatment. Nasal washings and aspirates are  unacceptable for Xpert Xpress SARS-CoV-2/FLU/RSV testing.  Fact Sheet for Patients: BloggerCourse.com  Fact Sheet for Healthcare Providers: SeriousBroker.it  This test is not yet approved or cleared by the [place] FDA and has been authorized for detection and/or diagnosis of SARS-CoV-2 by FDA under an Emergency Use Authorization (EUA). This EUA will remain in effect (meaning this test can be used) for the duration of the COVID-19 declaration under Section 564(b)(1) of the Act, 21 U.S.C. section 360bbb-3(b)(1), unless the authorization is terminated or revoked.  Performed at [facility], [address] [place], [place] [address]    WBC 07/25/2022 8.3  4.0 - 10.5 K/uL Final   RBC 07/25/2022 4.44  3.87 - 5.11 MIL/uL Final   Hemoglobin 07/25/2022 13.7  12.0 - 15.0 g/dL Final   HCT 19/14/7829 40.2  36.0 - 46.0 % Final   MCV 07/25/2022 90.5  80.0 - 100.0 fL Final   MCH 07/25/2022 30.9  26.0 - 34.0 pg Final   MCHC 07/25/2022 34.1  30.0 - 36.0 g/dL Final   RDW 56/21/3086 12.2  11.5 - 15.5 % Final   Platelets 07/25/2022 248  150 - 400 K/uL Final   nRBC 07/25/2022 0.0  0.0 - 0.2 % Final   Neutrophils Relative % 07/25/2022 43  % Final   Neutro Abs 07/25/2022 3.6  1.7 - 7.7 K/uL Final   Lymphocytes Relative 07/25/2022 52  % Final   Lymphs Abs 07/25/2022 4.2 (H)  0.7 - 4.0 K/uL Final   Monocytes Relative 07/25/2022 5  % Final   Monocytes Absolute 07/25/2022 0.4  0.1 -  1.0 K/uL Final   Eosinophils Relative 07/25/2022 0  % Final   Eosinophils Absolute 07/25/2022 0.0  0.0 - 0.5 K/uL Final   Basophils Relative 07/25/2022 0  % Final   Basophils Absolute 07/25/2022 0.0  0.0 - 0.1 K/uL Final   Immature Granulocytes 07/25/2022 0  % Final   Abs Immature Granulocytes 07/25/2022 0.02  0.00 - 0.07 K/uL Final   Performed at [facility], [address] [place], [place] [address]   Sodium 07/25/2022 138  135 - 145 mmol/L Final   Potassium 07/25/2022 4.0  3.5 - 5.1 mmol/L Final   Chloride 07/25/2022 104  98 - 111 mmol/L Final   CO2 07/25/2022 29  22 - 32 mmol/L Final   Glucose, Bld 07/25/2022 83  70 - 99 mg/dL Final   Glucose reference range applies only to samples taken after fasting for at least 8 hours.   BUN 07/25/2022 11  6 - 20 mg/dL Final   Creatinine, Ser 07/25/2022 0.97  0.44 - 1.00 mg/dL Final   Calcium 96/29/5284 9.2  8.9 - 10.3 mg/dL Final   Total Protein 13/24/4010 7.0  6.5 - 8.1 g/dL Final   Albumin 27/25/3664 3.8  3.5 - 5.0 g/dL Final   AST 40/34/7425 18  15 - 41 U/L Final   ALT 07/25/2022 22  0 - 44 U/L Final   Alkaline Phosphatase 07/25/2022 64  38 - 126 U/L Final   Total Bilirubin 07/25/2022 0.2 (L)  0.3 - 1.2 mg/dL Final   GFR, Estimated 07/25/2022 >60  >60 mL/min Final   Comment: (NOTE) Calculated using the CKD-EPI Creatinine Equation (2021)    Anion gap 07/25/2022 5  5 - 15 Final   Performed at [facility], [address] [place], [place] [address]   POC Amphetamine UR 07/25/2022 None Detected  NONE DETECTED (Cut Off Level 1000 ng/mL) Preliminary  POC Secobarbital (BAR) 07/25/2022 None Detected  NONE DETECTED (Cut Off Level 300 ng/mL) Preliminary   POC Buprenorphine (BUP) 07/25/2022 None Detected  NONE DETECTED (Cut Off Level 10 ng/mL) Preliminary   POC Oxazepam (BZO) 07/25/2022 None Detected  NONE DETECTED (Cut Off Level 300 ng/mL) Preliminary   POC Cocaine UR 07/25/2022 None Detected  NONE DETECTED (Cut Off Level 300 ng/mL)  Preliminary   POC Methamphetamine UR 07/25/2022 None Detected  NONE DETECTED (Cut Off Level 1000 ng/mL) Preliminary   POC Morphine 07/25/2022 None Detected  NONE DETECTED (Cut Off Level 300 ng/mL) Preliminary   POC Methadone UR 07/25/2022 None Detected  NONE DETECTED (Cut Off Level 300 ng/mL) Preliminary   POC Oxycodone UR 07/25/2022 None Detected  NONE DETECTED (Cut Off Level 100 ng/mL) Preliminary   POC Marijuana UR 07/25/2022 None Detected  NONE DETECTED (Cut Off Level 50 ng/mL) Preliminary   SARSCOV2ONAVIRUS 2 AG 07/25/2022 NEGATIVE  NEGATIVE Final   Comment: (NOTE) SARS-CoV-2 antigen NOT DETECTED.   Negative results are presumptive.  Negative results do not preclude SARS-CoV-2 infection and should not be used as the sole basis for treatment or other patient management decisions, including infection  control decisions, particularly in the presence of clinical signs and  symptoms consistent with COVID-19, or in those who have been in contact with the virus.  Negative results must be combined with clinical observations, patient history, and epidemiological information. The expected result is Negative.  Fact Sheet for Patients: https://www.jennings-kim.com/  Fact Sheet for Healthcare Providers: https://alexander-rogers.biz/  This test is not yet approved or cleared by the [place] FDA and  has been authorized for detection and/or diagnosis of SARS-CoV-2 by FDA under an Emergency Use Authorization (EUA).  This EUA will remain in effect (meaning this test can be used) for the duration of  the COV                          ID-19 declaration under Section 564(b)(1) of the Act, 21 U.S.C. section 360bbb-3(b)(1), unless the authorization is terminated or revoked sooner.     Cholesterol 07/25/2022 181  0 - 200 mg/dL Final   Triglycerides 16/07/9603 118  <150 mg/dL Final   HDL 54/06/8118 45  >40 mg/dL Final   Total CHOL/HDL Ratio 07/25/2022 4.0  RATIO Final    VLDL 07/25/2022 24  0 - 40 mg/dL Final   LDL Cholesterol 07/25/2022 112 (H)  0 - 99 mg/dL Final   Comment:        Total Cholesterol/HDL:CHD Risk Coronary Heart Disease Risk Table                     Men   Women  1/2 Average Risk   3.4   3.3  Average Risk       5.0   4.4  2 X Average Risk   9.6   7.1  3 X Average Risk  23.4   11.0        Use the calculated Patient Ratio above and the CHD Risk Table to determine the patient's CHD Risk.        ATP III CLASSIFICATION (LDL):  <100     mg/dL   Optimal  147-829  mg/dL   Near or Above                    Optimal  130-159  mg/dL   Borderline  562-130  mg/dL   High  >865  mg/dL   Very High Performed at [facility], [address] [place], [place] [address]    TSH 07/25/2022 6.668 (H)  0.350 - 4.500 uIU/mL Final   Comment: Performed by a 3rd Generation assay with a functional sensitivity of <=0.01 uIU/mL. Performed at [facility], [address] [place], [place] [address]    Glucose-Capillary 07/26/2022 104 (H)  70 - 99 mg/dL Final   Glucose reference range applies only to samples taken after fasting for at least 8 hours.   T3, Free 07/31/2022 2.3  2.0 - 4.4 pg/mL Final   Comment: (NOTE) Performed At: [facility] [address] [place], [place] [id number] Nicole SchimkeNagendra Sanjai MD BM:[id number]:[phone]    Free T4 07/31/2022 0.60 (L)  0.61 - 1.12 ng/dL Final   Comment: (NOTE) Biotin ingestion may interfere with free T4 tests. If the results are inconsistent with the TSH level, previous test results, or the clinical presentation, then consider biotin interference. If needed, order repeat testing after stopping biotin. Performed at [facility], [address] [place], [place] [address]    Glucose-Capillary 08/29/2022 100 (H)  70 - 99 mg/dL Final   Glucose reference range applies only to samples taken after fasting for at least 8 hours.   TSH 09/05/2022 0.793  0.350 - 4.500 uIU/mL Final   Comment: Performed by a  3rd Generation assay with a functional sensitivity of <=0.01 uIU/mL. Performed at [facility], [address] [place], [place] [address]    Free T4 09/05/2022 0.73  0.61 - 1.12 ng/dL Final   Comment: (NOTE) Biotin ingestion may interfere with free T4 tests. If the results are inconsistent with the TSH level, previous test results, or the clinical presentation, then consider biotin interference. If needed, order repeat testing after stopping biotin. Performed at [facility], [address] [place], [place] [address]    Preg Test, Ur 09/06/2022 Negative  Negative Final   Preg Test, Ur 09/06/2022 NEGATIVE  NEGATIVE Final   Comment:        THE SENSITIVITY OF THIS METHODOLOGY IS >24 mIU/mL    Preg Test, Ur 09/05/2022 NEGATIVE  NEGATIVE Final   Comment:        THE SENSITIVITY OF THIS METHODOLOGY IS >24 mIU/mL    Sodium 09/13/2022 136  135 - 145 mmol/L Final   Potassium 09/13/2022 4.5  3.5 - 5.1 mmol/L Final   Chloride 09/13/2022 105  98 - 111 mmol/L Final   CO2 09/13/2022 21 (L)  22 - 32 mmol/L Final   Glucose, Bld 09/13/2022 111 (H)  70 - 99 mg/dL Final   Glucose reference range applies only to samples taken after fasting for at least 8 hours.   BUN 09/13/2022 14  6 - 20 mg/dL Final   Creatinine, Ser 09/13/2022 0.92  0.44 - 1.00 mg/dL Final   Calcium 59/56/387512/10/2021 9.1  8.9 - 10.3 mg/dL Final   GFR, Estimated 09/13/2022 >60  >60 mL/min Final   Comment: (NOTE) Calculated using the CKD-EPI Creatinine Equation (2021)    Anion gap 09/13/2022 10  5 - 15 Final   Performed at [facility], [address] [place], [place] [address]   Vitamin B-12 09/13/2022 585  180 - 914 pg/mL Final   Comment: (NOTE) This assay is not validated for testing neonatal or myeloproliferative syndrome specimens for Vitamin B12 levels. Performed at [facility], [address] [place], [place] [address]    Color,  Urine 09/14/2022 YELLOW  YELLOW Final   APPearance 09/14/2022 HAZY  (A)  CLEAR Final   Specific Gravity, Urine 09/14/2022 1.025  1.005 - 1.030 Final   pH 09/14/2022 5.0  5.0 - 8.0 Final   Glucose, UA 09/14/2022 NEGATIVE  NEGATIVE mg/dL Final   Hgb urine dipstick 09/14/2022 NEGATIVE  NEGATIVE Final   Bilirubin Urine 09/14/2022 NEGATIVE  NEGATIVE Final   Ketones, ur 09/14/2022 NEGATIVE  NEGATIVE mg/dL Final   Protein, ur 09/14/2022 NEGATIVE  NEGATIVE mg/dL Final   Nitrite 09/14/2022 NEGATIVE  NEGATIVE Final   Leukocytes,Ua 09/14/2022 TRACE (A)  NEGATIVE Final   RBC / HPF 09/14/2022 0-5  0 - 5 RBC/hpf Final   WBC, UA 09/14/2022 0-5  0 - 5 WBC/hpf Final   Bacteria, UA 09/14/2022 RARE (A)  NONE SEEN Final   Squamous Epithelial / HPF 09/14/2022 0-5  0 - 5 Final   Mucus 09/14/2022 PRESENT   Final   Performed at [facility], [address] [place], [place] [address]   Glucose-Capillary 09/16/2022 105 (H)  70 - 99 mg/dL Final   Glucose reference range applies only to samples taken after fasting for at least 8 hours.   SARS Coronavirus 2 by RT PCR 09/30/2022 NEGATIVE  NEGATIVE Final   Comment: (NOTE) SARS-CoV-2 target nucleic acids are NOT DETECTED.  The SARS-CoV-2 RNA is generally detectable in upper respiratory specimens during the acute phase of infection. The lowest concentration of SARS-CoV-2 viral copies this assay can detect is 138 copies/mL. A negative result does not preclude SARS-Cov-2 infection and should not be used as the sole basis for treatment or other patient management decisions. A negative result may occur with  improper specimen collection/handling, submission of specimen other than nasopharyngeal swab, presence of viral mutation(s) within the areas targeted by this assay, and inadequate number of viral copies(<138 copies/mL). A negative result must be combined with clinical observations, patient history, and epidemiological information. The expected result is Negative.  Fact Sheet for Patients:   EntrepreneurPulse.com.au  Fact Sheet for Healthcare Providers:  IncredibleEmployment.be  This test is no                          t yet approved or cleared by the [place] FDA and  has been authorized for detection and/or diagnosis of SARS-CoV-2 by FDA under an Emergency Use Authorization (EUA). This EUA will remain  in effect (meaning this test can be used) for the duration of the COVID-19 declaration under Section 564(b)(1) of the Act, 21 U.S.C.section 360bbb-3(b)(1), unless the authorization is terminated  or revoked sooner.       Influenza A by PCR 09/30/2022 NEGATIVE  NEGATIVE Final   Influenza B by PCR 09/30/2022 NEGATIVE  NEGATIVE Final   Comment: (NOTE) The Xpert Xpress SARS-CoV-2/FLU/RSV plus assay is intended as an aid in the diagnosis of influenza from Nasopharyngeal swab specimens and should not be used as a sole basis for treatment. Nasal washings and aspirates are unacceptable for Xpert Xpress SARS-CoV-2/FLU/RSV testing.  Fact Sheet for Patients: EntrepreneurPulse.com.au  Fact Sheet for Healthcare Providers: IncredibleEmployment.be  This test is not yet approved or cleared by the [place] FDA and has been authorized for detection and/or diagnosis of SARS-CoV-2 by FDA under an Emergency Use Authorization (EUA). This EUA will remain in effect (meaning this test can be used) for the duration of the COVID-19 declaration under Section 564(b)(1) of the Act, 21 U.S.C. section 360bbb-3(b)(1), unless the authorization  is terminated or revoked.     Resp Syncytial Virus by PCR 09/30/2022 NEGATIVE  NEGATIVE Final   Comment: (NOTE) Fact Sheet for Patients: BloggerCourse.com  Fact Sheet for Healthcare Providers: SeriousBroker.it  This test is not yet approved or cleared by the [place] FDA and has been authorized for detection and/or  diagnosis of SARS-CoV-2 by FDA under an Emergency Use Authorization (EUA). This EUA will remain in effect (meaning this test can be used) for the duration of the COVID-19 declaration under Section 564(b)(1) of the Act, 21 U.S.C. section 360bbb-3(b)(1), unless the authorization is terminated or revoked.  Performed at [facility], [address] [place], [place] [address]    Sodium 10/01/2022 136  135 - 145 mmol/L Final   Potassium 10/01/2022 3.8  3.5 - 5.1 mmol/L Final   Chloride 10/01/2022 104  98 - 111 mmol/L Final   CO2 10/01/2022 27  22 - 32 mmol/L Final   Glucose, Bld 10/01/2022 98  70 - 99 mg/dL Final   Glucose reference range applies only to samples taken after fasting for at least 8 hours.   BUN 10/01/2022 12  6 - 20 mg/dL Final   Creatinine, Ser 10/01/2022 0.98  0.44 - 1.00 mg/dL Final   Calcium 40/98/1191 8.9  8.9 - 10.3 mg/dL Final   GFR, Estimated 10/01/2022 >60  >60 mL/min Final   Comment: (NOTE) Calculated using the CKD-EPI Creatinine Equation (2021)    Anion gap 10/01/2022 5  5 - 15 Final   Performed at [facility], [address], [place], [place] [address]   WBC 10/01/2022 5.1  4.0 - 10.5 K/uL Final   RBC 10/01/2022 4.31  3.87 - 5.11 MIL/uL Final   Hemoglobin 10/01/2022 12.7  12.0 - 15.0 g/dL Final   HCT 56/21/3086 38.8  36.0 - 46.0 % Final   MCV 10/01/2022 90.0  80.0 - 100.0 fL Final   MCH 10/01/2022 29.5  26.0 - 34.0 pg Final   MCHC 10/01/2022 32.7  30.0 - 36.0 g/dL Final   RDW 57/84/6962 12.4  11.5 - 15.5 % Final   Platelets 10/01/2022 300  150 - 400 K/uL Final   nRBC 10/01/2022 0.0  0.0 - 0.2 % Final   Performed at [facility], [address], [place], [place] [address]   Lipase 10/01/2022 29  11 - 51 U/L Final   Performed at [facility], [address] [place], [place] [address]   Color, Urine 10/05/2022 YELLOW  YELLOW Final   APPearance 10/05/2022 HAZY (A)  CLEAR Final   Specific Gravity, Urine 10/05/2022 1.018  1.005 - 1.030  Final   pH 10/05/2022 5.0  5.0 - 8.0 Final   Glucose, UA 10/05/2022 NEGATIVE  NEGATIVE mg/dL Final   Hgb urine dipstick 10/05/2022 NEGATIVE  NEGATIVE Final   Bilirubin Urine 10/05/2022 NEGATIVE  NEGATIVE Final   Ketones, ur 10/05/2022 NEGATIVE  NEGATIVE mg/dL Final   Protein, ur 10/16/7251 NEGATIVE  NEGATIVE mg/dL Final   Nitrite 66/44/0347 NEGATIVE  NEGATIVE Final   Leukocytes,Ua 10/05/2022 TRACE (A)  NEGATIVE Final   RBC / HPF 10/05/2022 0-5  0 - 5 RBC/hpf Final   WBC, UA 10/05/2022 0-5  0 - 5 WBC/hpf Final   Bacteria, UA 10/05/2022 RARE (A)  NONE SEEN Final   Squamous Epithelial / HPF 10/05/2022 0-5  0 - 5 Final   Mucus 10/05/2022 PRESENT   Final   Performed at [facility], [address],  [place], [place] [address]   Specimen Description 10/05/2022 URINE, CLEAN CATCH   Final   Special Requests 10/05/2022    Final                   Value:NONE Performed at [facility], [address] [place], [place] [address]    Culture 10/05/2022 10,000 COLONIES/mL MULTIPLE SPECIES PRESENT, SUGGEST RECOLLECTION (A)   Final   Report Status 10/05/2022 10/07/2022 FINAL   Final  Admission on 07/04/2022, Discharged on 07/05/2022  Component Date Value Ref Range Status   Sodium 07/04/2022 142  135 - 145 mmol/L Final   Potassium 07/04/2022 4.4  3.5 - 5.1 mmol/L Final   Chloride 07/04/2022 109  98 - 111 mmol/L Final   CO2 07/04/2022 26  22 - 32 mmol/L Final   Glucose, Bld 07/04/2022 96  70 - 99 mg/dL Final   Glucose reference range applies only to samples taken after fasting for at least 8 hours.   BUN 07/04/2022 15  6 - 20 mg/dL Final   Creatinine, Ser 07/04/2022 0.83  0.44 - 1.00 mg/dL Final   Calcium 30/86/5784 9.3  8.9 - 10.3 mg/dL Final   Total Protein 69/62/9528 7.2  6.5 - 8.1 g/dL Final   Albumin 41/32/4401 3.9  3.5 - 5.0 g/dL Final   AST 02/72/5366 23  15 - 41 U/L Final   ALT 07/04/2022 28  0 - 44 U/L Final   Alkaline Phosphatase 07/04/2022 77  38 - 126 U/L Final   Total  Bilirubin 07/04/2022 0.4  0.3 - 1.2 mg/dL Final   GFR, Estimated 07/04/2022 >60  >60 mL/min Final   Comment: (NOTE) Calculated using the CKD-EPI Creatinine Equation (2021)    Anion gap 07/04/2022 7  5 - 15 Final   Performed at [facility], [address] [place], [place] [address]   Alcohol, Ethyl (B) 07/04/2022 <10  <10 mg/dL Final   Comment: (NOTE) Lowest detectable limit for serum alcohol is 10 mg/dL.  For medical purposes only. Performed at [facility], [address] [place], [place] [address]    WBC 07/04/2022 7.0  4.0 - 10.5 K/uL Final   RBC 07/04/2022 4.18  3.87 - 5.11 MIL/uL Final   Hemoglobin 07/04/2022 12.8  12.0 - 15.0 g/dL Final   HCT 56/38/7564 39.1  36.0 - 46.0 % Final   MCV 07/04/2022 93.5  80.0 - 100.0 fL Final   MCH 07/04/2022 30.6  26.0 - 34.0 pg Final   MCHC 07/04/2022 32.7  30.0 - 36.0 g/dL Final   RDW 33/29/5188 12.9  11.5 - 15.5 % Final   Platelets 07/04/2022 243  150 - 400 K/uL Final   nRBC 07/04/2022 0.0  0.0 - 0.2 % Final   Neutrophils Relative % 07/04/2022 43  % Final   Neutro Abs 07/04/2022 3.0  1.7 - 7.7 K/uL Final   Lymphocytes Relative 07/04/2022 50  % Final   Lymphs Abs 07/04/2022 3.5  0.7 - 4.0 K/uL Final   Monocytes Relative 07/04/2022 7  % Final   Monocytes Absolute 07/04/2022 0.5  0.1 - 1.0 K/uL Final   Eosinophils Relative 07/04/2022 0  % Final   Eosinophils Absolute 07/04/2022 0.0  0.0 - 0.5 K/uL Final   Basophils Relative 07/04/2022 0  % Final   Basophils Absolute 07/04/2022 0.0  0.0 - 0.1 K/uL Final   Immature Granulocytes 07/04/2022 0  % Final   Abs Immature Granulocytes 07/04/2022 0.01  0.00 - 0.07 K/uL  Final   Performed at [facility], [address] [place], [place] [address]   I-stat hCG, quantitative 07/04/2022 <5.0  <5 mIU/mL Final   Comment 3 07/04/2022          Final   Comment:   GEST. AGE      CONC.  (mIU/mL)   <=1 WEEK        5 - 50     2 WEEKS       50 - 500      3 WEEKS       100 - 10,000     4 WEEKS     1,000 - 30,000        FEMALE AND NON-PREGNANT FEMALE:     LESS THAN 5 mIU/mL   Admission on 06/14/2022, Discharged on 06/14/2022  Component Date Value Ref Range Status   Color, UA 06/14/2022 yellow  yellow Final   Clarity, UA 06/14/2022 cloudy (A)  clear Final   Glucose, UA 06/14/2022 negative  negative mg/dL Final   Bilirubin, UA 60/45/4098 negative  negative Final   Ketones, POC UA 06/14/2022 negative  negative mg/dL Final   Spec Grav, UA 11/91/4782 1.025  1.010 - 1.025 Final   Blood, UA 06/14/2022 negative  negative Final   pH, UA 06/14/2022 7.5  5.0 - 8.0 Final   Protein Ur, POC 06/14/2022 =30 (A)  negative mg/dL Final   Urobilinogen, UA 06/14/2022 0.2  0.2 or 1.0 E.U./dL Final   Nitrite, UA 95/62/1308 Negative  Negative Final   Leukocytes, UA 06/14/2022 Negative  Negative Final   Preg Test, Ur 06/14/2022 Negative  Negative Final   Specimen Description 06/14/2022 URINE, CLEAN CATCH   Final   Special Requests 06/14/2022    Final                   Value:NONE Performed at [facility], [address], [place], [place] [address]    Culture 06/14/2022 MULTIPLE SPECIES PRESENT, SUGGEST RECOLLECTION (A)   Final   Report Status 06/14/2022 06/16/2022 FINAL   Final  Admission on 06/07/2022, Discharged on 06/10/2022  Component Date Value Ref Range Status   SARS Coronavirus 2 by RT PCR 06/07/2022 NEGATIVE  NEGATIVE Final   Comment: (NOTE) SARS-CoV-2 target nucleic acids are NOT DETECTED.  The SARS-CoV-2 RNA is generally detectable in upper respiratory specimens during the acute phase of infection. The lowest concentration of SARS-CoV-2 viral copies this assay can detect is 138 copies/mL. A negative result does not preclude SARS-Cov-2 infection and should not be used as the sole basis for treatment or other patient management decisions. A negative result may occur with  improper specimen collection/handling, submission of specimen  other than nasopharyngeal swab, presence of viral mutation(s) within the areas targeted by this assay, and inadequate number of viral copies(<138 copies/mL). A negative result must be combined with clinical observations, patient history, and epidemiological information. The expected result is Negative.  Fact Sheet for Patients:  BloggerCourse.com  Fact Sheet for Healthcare Providers:  SeriousBroker.it  This test is no                          t yet approved or cleared by the [place] FDA and  has been authorized for detection and/or diagnosis of SARS-CoV-2 by FDA under an Emergency Use Authorization (EUA). This EUA will remain  in effect (meaning this test can be used) for the duration of the COVID-19 declaration  under Section 564(b)(1) of the Act, 21 U.S.C.section 360bbb-3(b)(1), unless the authorization is terminated  or revoked sooner.       Influenza A by PCR 06/07/2022 NEGATIVE  NEGATIVE Final   Influenza B by PCR 06/07/2022 NEGATIVE  NEGATIVE Final   Comment: (NOTE) The Xpert Xpress SARS-CoV-2/FLU/RSV plus assay is intended as an aid in the diagnosis of influenza from Nasopharyngeal swab specimens and should not be used as a sole basis for treatment. Nasal washings and aspirates are unacceptable for Xpert Xpress SARS-CoV-2/FLU/RSV testing.  Fact Sheet for Patients: BloggerCourse.com  Fact Sheet for Healthcare Providers: SeriousBroker.it  This test is not yet approved or cleared by the [place] FDA and has been authorized for detection and/or diagnosis of SARS-CoV-2 by FDA under an Emergency Use Authorization (EUA). This EUA will remain in effect (meaning this test can be used) for the duration of the COVID-19 declaration under Section 564(b)(1) of the Act, 21 U.S.C. section 360bbb-3(b)(1), unless the authorization is terminated or revoked.  Performed at  [facility], [address] [place], [place] [address]    WBC 06/07/2022 5.7  4.0 - 10.5 K/uL Final   RBC 06/07/2022 4.39  3.87 - 5.11 MIL/uL Final   Hemoglobin 06/07/2022 13.2  12.0 - 15.0 g/dL Final   HCT 29/51/8841 40.4  36.0 - 46.0 % Final   MCV 06/07/2022 92.0  80.0 - 100.0 fL Final   MCH 06/07/2022 30.1  26.0 - 34.0 pg Final   MCHC 06/07/2022 32.7  30.0 - 36.0 g/dL Final   RDW 66/03/3015 12.4  11.5 - 15.5 % Final   Platelets 06/07/2022 308  150 - 400 K/uL Final   nRBC 06/07/2022 0.0  0.0 - 0.2 % Final   Neutrophils Relative % 06/07/2022 42  % Final   Neutro Abs 06/07/2022 2.4  1.7 - 7.7 K/uL Final   Lymphocytes Relative 06/07/2022 54  % Final   Lymphs Abs 06/07/2022 3.1  0.7 - 4.0 K/uL Final   Monocytes Relative 06/07/2022 4  % Final   Monocytes Absolute 06/07/2022 0.2  0.1 - 1.0 K/uL Final   Eosinophils Relative 06/07/2022 0  % Final   Eosinophils Absolute 06/07/2022 0.0  0.0 - 0.5 K/uL Final   Basophils Relative 06/07/2022 0  % Final   Basophils Absolute 06/07/2022 0.0  0.0 - 0.1 K/uL Final   Immature Granulocytes 06/07/2022 0  % Final   Abs Immature Granulocytes 06/07/2022 0.01  0.00 - 0.07 K/uL Final   Performed at [facility], [address], [place], [place] [address]   Sodium 06/07/2022 139  135 - 145 mmol/L Final   Potassium 06/07/2022 4.0  3.5 - 5.1 mmol/L Final   Chloride 06/07/2022 104  98 - 111 mmol/L Final   CO2 06/07/2022 28  22 - 32 mmol/L Final   Glucose, Bld 06/07/2022 104 (H)  70 - 99 mg/dL Final   Glucose reference range applies only to samples taken after fasting for at least 8 hours.   BUN 06/07/2022 8  6 - 20 mg/dL Final   Creatinine, Ser 06/07/2022 0.84  0.44 - 1.00 mg/dL Final   Calcium 23/55/7322 9.1  8.9 - 10.3 mg/dL Final   Total Protein 02/54/2706 6.9  6.5 - 8.1 g/dL Final   Albumin 23/76/2831 3.7  3.5 - 5.0 g/dL Final   AST 51/76/1607 19  15 - 41 U/L Final   ALT 06/07/2022 22  0 - 44 U/L Final   Alkaline Phosphatase 06/07/2022  54  38 - 126 U/L Final   Total Bilirubin 06/07/2022 0.4  0.3 - 1.2 mg/dL Final   GFR, Estimated 06/07/2022 >60  >60 mL/min Final   Comment: (NOTE) Calculated using the CKD-EPI Creatinine Equation (2021)    Anion gap 06/07/2022 7  5 - 15 Final   Performed at [facility], [address], [name], [place] [address]   Hgb A1c MFr Bld 06/07/2022 5.0  4.8 - 5.6 % Final   Comment: (NOTE) Pre diabetes:          5.7%-6.4%  Diabetes:              >6.4%  Glycemic control for   <7.0% adults with diabetes    Mean Plasma Glucose 06/07/2022 96.8  mg/dL Final   Performed at [facility], [address], [place], [place] [address]   TSH 06/07/2022 1.620  0.350 - 4.500 uIU/mL Final   Comment: Performed by a 3rd Generation assay with a functional sensitivity of <=0.01 uIU/mL. Performed at [facility], [address], [place], [place] [address]    RPR Ser Ql 06/07/2022 NON REACTIVE  NON REACTIVE Final   Performed at [facility], [address] [place], [place] [address]   Color, Urine 06/07/2022 YELLOW  YELLOW Final   APPearance 06/07/2022 HAZY (A)  CLEAR Final   Specific Gravity, Urine 06/07/2022 1.018  1.005 - 1.030 Final   pH 06/07/2022 7.0  5.0 - 8.0 Final   Glucose, UA 06/07/2022 NEGATIVE  NEGATIVE mg/dL Final   Hgb urine dipstick 06/07/2022 NEGATIVE  NEGATIVE Final   Bilirubin Urine 06/07/2022 NEGATIVE  NEGATIVE Final   Ketones, ur 06/07/2022 NEGATIVE  NEGATIVE mg/dL Final   Protein, ur 00/17/4944 NEGATIVE  NEGATIVE mg/dL Final   Nitrite 96/75/9163 NEGATIVE  NEGATIVE Final   Leukocytes,Ua 06/07/2022 NEGATIVE  NEGATIVE Final   Performed at [facility], [address] [place], [place] [address]   Cholesterol 06/07/2022 164  0 - 200 mg/dL Final   Triglycerides 99/35/7017 158 (H)  <150 mg/dL Final   HDL 79/39/0300 44  >40 mg/dL Final   Total CHOL/HDL Ratio 06/07/2022 3.7  RATIO Final   VLDL 06/07/2022 32  0 - 40 mg/dL Final   LDL Cholesterol 06/07/2022 88   0 - 99 mg/dL Final   Comment:        Total Cholesterol/HDL:CHD Risk Coronary Heart Disease Risk Table                     Men   Women  1/2 Average Risk   3.4   3.3  Average Risk       5.0   4.4  2 X Average Risk   9.6   7.1  3 X Average Risk  23.4   11.0        Use the calculated Patient Ratio above and the CHD Risk Table to determine the patient's CHD Risk.        ATP III CLASSIFICATION (LDL):  <100     mg/dL   Optimal  923-300  mg/dL   Near or Above                    Optimal  130-159  mg/dL   Borderline  762-263  mg/dL   High  >335     mg/dL   Very High Performed at [facility], [address], [place], [place] [address]  HIV Screen 4th Generation wRfx 06/07/2022 Non Reactive  Non Reactive Final   Performed at [facility], [address] [place], [place] [address]   SARSCOV2ONAVIRUS 2 AG 06/07/2022 NEGATIVE  NEGATIVE Final   Comment: (NOTE) SARS-CoV-2 antigen NOT DETECTED.   Negative results are presumptive.  Negative results do not preclude SARS-CoV-2 infection and should not be used as the sole basis for treatment or other patient management decisions, including infection  control decisions, particularly in the presence of clinical signs and  symptoms consistent with COVID-19, or in those who have been in contact with the virus.  Negative results must be combined with clinical observations, patient history, and epidemiological information. The expected result is Negative.  Fact Sheet for Patients: https://www.jennings-kim.com/https://www.fda.gov/media/141569/download  Fact Sheet for Healthcare Providers: https://alexander-rogers.biz/https://www.fda.gov/media/141568/download  This test is not yet approved or cleared by the [place] FDA and  has been authorized for detection and/or diagnosis of SARS-CoV-2 by FDA under an Emergency Use Authorization (EUA).  This EUA will remain in effect (meaning this test can be used) for the duration of  the COV                          ID-19 declaration under  Section 564(b)(1) of the Act, 21 U.S.C. section 360bbb-3(b)(1), unless the authorization is terminated or revoked sooner.     POC Amphetamine UR 06/07/2022 None Detected  NONE DETECTED (Cut Off Level 1000 ng/mL) Final   POC Secobarbital (BAR) 06/07/2022 None Detected  NONE DETECTED (Cut Off Level 300 ng/mL) Final   POC Buprenorphine (BUP) 06/07/2022 None Detected  NONE DETECTED (Cut Off Level 10 ng/mL) Final   POC Oxazepam (BZO) 06/07/2022 None Detected  NONE DETECTED (Cut Off Level 300 ng/mL) Final   POC Cocaine UR 06/07/2022 None Detected  NONE DETECTED (Cut Off Level 300 ng/mL) Final   POC Methamphetamine UR 06/07/2022 None Detected  NONE DETECTED (Cut Off Level 1000 ng/mL) Final   POC Morphine 06/07/2022 None Detected  NONE DETECTED (Cut Off Level 300 ng/mL) Final   POC Methadone UR 06/07/2022 None Detected  NONE DETECTED (Cut Off Level 300 ng/mL) Final   POC Oxycodone UR 06/07/2022 None Detected  NONE DETECTED (Cut Off Level 100 ng/mL) Final   POC Marijuana UR 06/07/2022 None Detected  NONE DETECTED (Cut Off Level 50 ng/mL) Final   Preg Test, Ur 06/08/2022 NEGATIVE  NEGATIVE Final   Comment:        THE SENSITIVITY OF THIS METHODOLOGY IS >20 mIU/mL. Performed at [facility], [address] [place], [place] [address]    Preg Test, Ur 06/08/2022 NEGATIVE  NEGATIVE Final   Comment:        THE SENSITIVITY OF THIS METHODOLOGY IS >24 mIU/mL     Blood Alcohol level:  Lab Results  Component Value Date   ETH <10 07/04/2022   ETH <10 02/07/2022    Metabolic Disorder Labs: Lab Results  Component Value Date   HGBA1C 5.7 (H) 10/15/2022   MPG 117 10/15/2022   MPG 96.8 06/07/2022   No results found for: "PROLACTIN" Lab Results  Component Value Date   CHOL 181 07/25/2022   TRIG 118 07/25/2022   HDL 45 07/25/2022   CHOLHDL 4.0 07/25/2022   VLDL 24 07/25/2022   LDLCALC 112 (H) 07/25/2022   LDLCALC 88 06/07/2022    Therapeutic Lab Levels: No results found for:  "LITHIUM" No results found for: "VALPROATE" No results found for: "CBMZ"  Physical Findings   GAD-7    Flowsheet Row Office Visit from 12/17/2021 in [facility]  Total GAD-7 Score 17      PHQ2-9    Flowsheet Row ED from 06/07/2022 in [facility] Office Visit from 12/17/2021 in [facility]  PHQ-2 Total Score 1 2  PHQ-9 Total Score 2 8      Flowsheet Row ED from 10/07/2022 in [facility] Most recent reading at 10/07/2022  8:16 PM ED from 10/07/2022 in [facility] EMERGENCY DEPARTMENT Most recent reading at 10/07/2022 12:18 PM ED from 07/25/2022 in [facility] Most recent reading at 10/06/2022  9:38 AM  C-SSRS RISK CATEGORY No Risk No Risk No Risk        Musculoskeletal  Strength & Muscle Tone: within normal limits Gait & Station: normal Patient leans: N/A  Psychiatric Specialty Exam  Presentation  General Appearance:  Appropriate for Environment  Eye Contact: Good  Speech: Clear and Coherent; Normal Rate  Speech Volume: Normal  Handedness: Right   Mood and Affect  Mood: Euthymic  Affect: Appropriate; Congruent   Thought Process  Thought Processes: Coherent; Goal Directed  Descriptions of Associations:Intact  Orientation:Full (Time, Place and Person)  Thought Content:Logical; WDL  Diagnosis of Schizophrenia or Schizoaffective disorder in past: No    Hallucinations:Hallucinations: None  Ideas of Reference:None  Suicidal Thoughts:Suicidal Thoughts: No  Homicidal Thoughts:Homicidal Thoughts: No   Sensorium  Memory: Immediate Good; Recent Good  Judgment: Intact  Insight: Present   Executive Functions  Concentration: Good  Attention Span: Good  Recall: Good  Fund of Knowledge: Good  Language: Good   Psychomotor Activity  Psychomotor Activity: Psychomotor Activity:  Normal   Assets  Assets: Communication Skills; Desire for Improvement; Leisure Time   Sleep  Sleep: Sleep: Good   Nutritional Assessment (For [facility] and [facility] admissions only) Has the patient had a weight loss or gain of 10 pounds or more in the last 3 months?: No Has the patient had a decrease in food intake/or appetite?: No Does the patient have dental problems?: No Does the patient have eating habits or behaviors that may be indicators of an eating disorder including binging or inducing vomiting?: No Has the patient recently lost weight without trying?: 0 Has the patient been eating poorly because of a decreased appetite?: 0 Malnutrition Screening Tool Score: 0    Physical Exam  Physical Exam Vitals and nursing note reviewed.  Constitutional:      General: She is not in acute distress.    Appearance: She is not ill-appearing or diaphoretic.  HENT:     Head: Normocephalic and atraumatic.     Ears:     Comments: No redness, tenderness, discharge seen on outter ear and ear canal b/l Pulmonary:     Effort: Pulmonary effort is normal. No respiratory distress.  Neurological:     General: No focal deficit present.     Mental Status: She is alert.    Review of Systems  Constitutional: Negative.   HENT:  Positive for ear pain. Negative for sore throat.   Respiratory:  Negative for cough, shortness of breath and wheezing.   Cardiovascular:  Negative for chest pain and palpitations.  Gastrointestinal: Negative.   Genitourinary: Negative.   Musculoskeletal: Negative.   Skin: Negative.   Neurological: Negative.   Psychiatric/Behavioral:  Negative for depression, hallucinations, substance abuse and suicidal ideas. The patient is not  nervous/anxious and does not have insomnia.    Blood pressure 94/64, pulse 94, temperature 98.2 F (36.8 C), temperature source Oral, resp. rate 18, height 5\' 1"  (1.549 m), weight 198 lb (89.8 kg), SpO2 100 %. Body mass index is 37.41  kg/m.  Treatment Plan Summary: Patient continues to be psychiatrically cleared.  Will continue to collaborated with social work/TOC and legal guardian for appropriate residential facility.    Disposition:  There is a projected date of October 18, 2022 at 12 noon that [facility] and [facility].    Princess BruinsJulie Roberta Angell, DO 10/17/2022 12:41 PM

## 2022-10-17 NOTE — ED Notes (Signed)
Patient is sitting in bed quietly, she had complaint of losing her breath easily. This nurse advised her to sit for a second and relax. Patient is now in no distress

## 2022-10-17 NOTE — ED Notes (Addendum)
Patient is currently lying in bed quietly no distress noted, will continue to monitor patient for safety

## 2022-10-18 DIAGNOSIS — F17200 Nicotine dependence, unspecified, uncomplicated: Secondary | ICD-10-CM | POA: Diagnosis not present

## 2022-10-18 DIAGNOSIS — N9489 Other specified conditions associated with female genital organs and menstrual cycle: Secondary | ICD-10-CM | POA: Diagnosis not present

## 2022-10-18 DIAGNOSIS — Z1152 Encounter for screening for COVID-19: Secondary | ICD-10-CM | POA: Diagnosis not present

## 2022-10-18 DIAGNOSIS — F333 Major depressive disorder, recurrent, severe with psychotic symptoms: Secondary | ICD-10-CM | POA: Diagnosis not present

## 2022-10-18 DIAGNOSIS — E118 Type 2 diabetes mellitus with unspecified complications: Secondary | ICD-10-CM | POA: Diagnosis not present

## 2022-10-18 DIAGNOSIS — J019 Acute sinusitis, unspecified: Secondary | ICD-10-CM | POA: Diagnosis not present

## 2022-10-18 DIAGNOSIS — F419 Anxiety disorder, unspecified: Secondary | ICD-10-CM | POA: Diagnosis not present

## 2022-10-18 DIAGNOSIS — E039 Hypothyroidism, unspecified: Secondary | ICD-10-CM | POA: Diagnosis not present

## 2022-10-18 DIAGNOSIS — N390 Urinary tract infection, site not specified: Secondary | ICD-10-CM | POA: Diagnosis not present

## 2022-10-18 DIAGNOSIS — F209 Schizophrenia, unspecified: Secondary | ICD-10-CM | POA: Diagnosis not present

## 2022-10-18 MED ORDER — SALINE SPRAY 0.65 % NA SOLN
1.0000 | Freq: Every day | NASAL | Status: DC
  Administered 2022-10-18 – 2022-11-23 (×34): 1 via NASAL
  Filled 2022-10-18: qty 44

## 2022-10-18 NOTE — ED Provider Notes (Cosign Needed Addendum)
Behavioral Health Progress Note  Date and Time: 10/18/2022 8:47 AM Name: Nicole [name] MRN:  [id number]  Brief HPI: Nicole [name] 32 y.o., female patient with schizophrenia, MDD with psychosis,  presented to [facility], voluntarily, as a walk-in accompanied by [employer] after a disagreement and verbal altercation with personnel at the group home.  She has been dismissed previously from other group homes due to threatening behaviors, and suicidal ideation and the one which she was residing in.  Patient was admitted to continuous assessment unit on 07/25/2022 as she also endorsed passive SI at that time along with symptoms of visual hallucination. Patient was subsequently psychiatrically cleared during her admission to the continuous assessment unit.  However patient remains here at [facility] bordering until adequate- residential placement can be obtained.     Legal Guardian: Mom Nicole [name]) transitioning to be Dad Nicole [name]) Point of contact: Dad Nicole [name])   Tests administered: Wechsler Adult Intelligence Scale-4 (see Eloise Harman, PhD consult note on 08/19/2022 for full info) Vineland-3 Adaptive Behavior Scales Assessment completed on 09/13/2022 administered by Rosezetta Schlatter, MD   Subjective:  "My ears still hurt" Patient was initially seen resting in the recliner, no acute distress, awake.  Patient was pleasant, cooperative, and engaged with evaluation. Patient reported feeling heartbroken because she expected to leave today, however something did not work out and she will not be dc'd today. Reported feeling ok otherwise. Reassured patient that this is a natural reaction. Patient does remain hopefull. Her ears are still bothering her, but is not worsening. Encouraged to continue using the antihistamine, flonase, and debronx daily to help with ear pressure. Reported that her sleep and appetite were stable and appropriate.  Denied side effects to medication.  Patient denied SI/HI/AVH, paranoia, and  contracted to safety on the unit. She had no other questions or concerns and was amenable to plan per below.   Diagnosis:  Final diagnoses:  Tobacco use disorder  Schizophrenia, unspecified type ([place])  MDD (major depressive disorder), recurrent episode, moderate (HCC)  Hypothyroidism, unspecified type    Total Time spent with patient: 15 minutes  Past Psychiatric History: schizoaffective, borderline IDD, anxiety, and MDD  Past Medical History:  Past Medical History:  Diagnosis Date   Anxiety    Depression    Hypothyroidism 08/07/2022   Tobacco use disorder 08/07/2022    Past Surgical History:  Procedure Laterality Date   WISDOM TOOTH EXTRACTION Bilateral 2020   Family History:  Family History  Problem Relation Age of Onset   Hypertension Father    Diabetes Father    Family Psychiatric  History: None reported Social History:  Social History   Substance and Sexual Activity  Alcohol Use Never     Social History   Substance and Sexual Activity  Drug Use Never    Social History   Socioeconomic History   Marital status: Single    Spouse name: Not on file   Number of children: Not on file   Years of education: Not on file   Highest education level: Not on file  Occupational History   Not on file  Tobacco Use   Smoking status: Every Day    Types: Cigarettes   Smokeless tobacco: Never  Vaping Use   Vaping Use: Some days  Substance and Sexual Activity   Alcohol use: Never   Drug use: Never   Sexual activity: Yes    Partners: Female    Birth control/protection: Implant  Other Topics Concern   Not on  file  Social History Narrative   Not on file   Social Determinants of Health   Financial Resource Strain: Not on file  Food Insecurity: Not on file  Transportation Needs: Not on file  Physical Activity: Not on file  Stress: Not on file  Social Connections: Not on file   SDOH:  SDOH Screenings   Depression (PHQ2-9): Low Risk  (06/10/2022)  Tobacco Use:  High Risk (10/16/2022)   Additional Social History:                          Current Medications:  Current Facility-Administered Medications  Medication Dose Route Frequency Provider Last Rate Last Admin   acetaminophen (TYLENOL) tablet 650 mg  650 mg Oral Q6H PRN Haynes Kerns, NP       alum & mag hydroxide-simeth (MAALOX/MYLANTA) 200-200-20 MG/5ML suspension 30 mL  30 mL Oral Q4H PRN Maple Hudson, Veronique M, NP   30 mL at 10/18/22 0512   carbamide peroxide (DEBROX) 6.5 % OTIC (EAR) solution 5 drop  5 drop Both EARS BID Merrily Brittle, DO   5 drop at 10/17/22 2154   cetaphil lotion   Topical PRN Merrily Brittle, DO       fluticasone [facility]) 50 MCG/ACT nasal spray 1 spray  1 spray Each Nare QHS Merrily Brittle, DO   1 spray at 10/17/22 2153   hydrOXYzine (ATARAX) tablet 25 mg  25 mg Oral TID PRN Christene Slates, MD   25 mg at 10/17/22 2152   levothyroxine (SYNTHROID) tablet 100 mcg  100 mcg Oral Once Byungura, Veronique M, NP       levothyroxine (SYNTHROID) tablet 100 mcg  100 mcg Oral Q0600 Haynes Kerns, NP   100 mcg at 10/18/22 [id number]   loratadine (CLARITIN) tablet 10 mg  10 mg Oral Daily Carrion-Carrero, Caryl Ada, MD   10 mg at 10/17/22 0931   magnesium hydroxide (MILK OF MAGNESIA) suspension 30 mL  30 mL Oral Daily PRN Haynes Kerns, NP   30 mL at 10/12/22 2145   metFORMIN (GLUCOPHAGE) tablet 500 mg  500 mg Oral Q breakfast Carrion-Carrero, Caryl Ada, MD   500 mg at 10/17/22 [id number]   nicotine (NICODERM CQ - dosed in mg/24 hours) patch 14 mg  14 mg Transdermal Daily PRN Carrion-Carrero, Caryl Ada, MD       ondansetron (ZOFRAN-ODT) disintegrating tablet 4 mg  4 mg Oral Q8H PRN Carrion-Carrero, Margely, MD   4 mg at 10/18/22 [id number]   Oxcarbazepine (TRILEPTAL) tablet 300 mg  300 mg Oral BID Maple Hudson, Veronique M, NP   300 mg at 10/17/22 2152   pantoprazole (PROTONIX) EC tablet 40 mg  40 mg Oral Daily Carrion-Carrero, Caryl Ada, MD   40 mg at 10/17/22 0931    polyethylene glycol (MIRALAX / GLYCOLAX) packet 17 g  17 g Oral Daily Merrily Brittle, DO   17 g at 10/17/22 0934   QUEtiapine (SEROQUEL) tablet 400 mg  400 mg Oral BID Maple Hudson, Veronique M, NP   400 mg at 10/17/22 2152   sertraline (ZOLOFT) tablet 150 mg  150 mg Oral Daily Carrion-Carrero, Margely, MD       traZODone (DESYREL) tablet 100 mg  100 mg Oral QHS Maple Hudson, Veronique M, NP   100 mg at 10/17/22 2152   traZODone (DESYREL) tablet 50 mg  50 mg Oral QHS PRN Haynes Kerns, NP   50 mg at 10/17/22 2152   valACYclovir (VALTREX) tablet 500 mg  500 mg Oral Daily  Christene Slates, MD   500 mg at 10/17/22 [id number]   Current Outpatient Medications  Medication Sig Dispense Refill   ABILIFY MAINTENA 400 MG PRSY prefilled syringe 400 mg every 28 (twenty-eight) days.     cetirizine (ZYRTEC) 10 MG tablet Take 10 mg by mouth daily.     cyclobenzaprine (FLEXERIL) 10 MG tablet Take 1 tablet (10 mg total) by mouth 2 (two) times daily as needed for muscle spasms. 20 tablet 0   fluticasone (FLONASE) 50 MCG/ACT nasal spray Place 1 spray into both nostrils daily.     meloxicam (MOBIC) 15 MG tablet Take 15 mg by mouth daily.     nitrofurantoin, macrocrystal-monohydrate, (MACROBID) 100 MG capsule Take 1 capsule (100 mg total) by mouth 2 (two) times daily. 10 capsule 0   ondansetron (ZOFRAN-ODT) 4 MG disintegrating tablet Take 1 tablet (4 mg total) by mouth every 8 (eight) hours as needed for nausea or vomiting. 20 tablet 0   Oxcarbazepine (TRILEPTAL) 300 MG tablet Take 1 tablet (300 mg total) by mouth 2 (two) times daily. 60 tablet 0   QUEtiapine (SEROQUEL) 400 MG tablet Take 1 tablet (400 mg total) by mouth 2 (two) times daily. 60 tablet 0   sertraline (ZOLOFT) 50 MG tablet Take 3 tablets (150 mg total) by mouth in the morning. 90 tablet 0   traZODone (DESYREL) 100 MG tablet Take 1 tablet (100 mg total) by mouth at bedtime. 30 tablet 0   valACYclovir (VALTREX) 500 MG tablet Take 500 mg by mouth daily.       Labs  Lab Results:  Admission on 10/07/2022  Component Date Value Ref Range Status   Hgb A1c MFr Bld 10/15/2022 5.7 (H)  4.8 - 5.6 % Final   Comment: (NOTE)         Prediabetes: 5.7 - 6.4         Diabetes: >6.4         Glycemic control for adults with diabetes: <7.0    Mean Plasma Glucose 10/15/2022 117  mg/dL Final   Comment: (NOTE) Performed At: [facility] [place], [place] [id number] Rush Farmer MD [id number]   Admission on 10/07/2022, Discharged on 10/07/2022  Component Date Value Ref Range Status   Color, Urine 10/07/2022 YELLOW  YELLOW Final   APPearance 10/07/2022 HAZY (A)  CLEAR Final   Specific Gravity, Urine 10/07/2022 1.011  1.005 - 1.030 Final   pH 10/07/2022 7.0  5.0 - 8.0 Final   Glucose, UA 10/07/2022 NEGATIVE  NEGATIVE mg/dL Final   Hgb urine dipstick 10/07/2022 SMALL (A)  NEGATIVE Final   Bilirubin Urine 10/07/2022 NEGATIVE  NEGATIVE Final   Ketones, ur 10/07/2022 NEGATIVE  NEGATIVE mg/dL Final   Protein, ur 10/07/2022 NEGATIVE  NEGATIVE mg/dL Final   Nitrite 10/07/2022 NEGATIVE  NEGATIVE Final   Leukocytes,Ua 10/07/2022 LARGE (A)  NEGATIVE Final   RBC / HPF 10/07/2022 0-5  0 - 5 RBC/hpf Final   WBC, UA 10/07/2022 0-5  0 - 5 WBC/hpf Final   Bacteria, UA 10/07/2022 FEW (A)  NONE SEEN Final   Squamous Epithelial / HPF 10/07/2022 11-20  0 - 5 Final   Performed at [facility], [place] [address], [place], [place] [address]   I-stat hCG, quantitative 10/07/2022 <5.0  <5 mIU/mL Final   Comment 3 10/07/2022          Final   Comment:   GEST. AGE      CONC.  (mIU/mL)   <=1 WEEK  5 - 50     2 WEEKS       50 - 500     3 WEEKS       100 - 10,000     4 WEEKS     1,000 - 30,000        FEMALE AND NON-PREGNANT FEMALE:     LESS THAN 5 mIU/mL    Sodium 10/07/2022 138  135 - 145 mmol/L Final   Potassium 10/07/2022 4.3  3.5 - 5.1 mmol/L Final   Chloride 10/07/2022 104  98 - 111 mmol/L Final   CO2 10/07/2022 28  22 - 32 mmol/L  Final   Glucose, Bld 10/07/2022 90  70 - 99 mg/dL Final   Glucose reference range applies only to samples taken after fasting for at least 8 hours.   BUN 10/07/2022 8  6 - 20 mg/dL Final   Creatinine, Ser 10/07/2022 0.96  0.44 - 1.00 mg/dL Final   Calcium 10/07/2022 9.1  8.9 - 10.3 mg/dL Final   GFR, Estimated 10/07/2022 >60  >60 mL/min Final   Comment: (NOTE) Calculated using the CKD-EPI Creatinine Equation (2021)    Anion gap 10/07/2022 6  5 - 15 Final   Performed at [facility], [place] [address], [place], [place] [address]   WBC 10/07/2022 5.8  4.0 - 10.5 K/uL Final   RBC 10/07/2022 4.36  3.87 - 5.11 MIL/uL Final   Hemoglobin 10/07/2022 12.9  12.0 - 15.0 g/dL Final   HCT 10/07/2022 40.0  36.0 - 46.0 % Final   MCV 10/07/2022 91.7  80.0 - 100.0 fL Final   MCH 10/07/2022 29.6  26.0 - 34.0 pg Final   MCHC 10/07/2022 32.3  30.0 - 36.0 g/dL Final   RDW 10/07/2022 12.4  11.5 - 15.5 % Final   Platelets 10/07/2022 295  150 - 400 K/uL Final   nRBC 10/07/2022 0.0  0.0 - 0.2 % Final   Performed at [facility] [address] [place], [place] [address]   SARS Coronavirus 2 by RT PCR 10/07/2022 NEGATIVE  NEGATIVE Final   Comment: (NOTE) SARS-CoV-2 target nucleic acids are NOT DETECTED.  The SARS-CoV-2 RNA is generally detectable in upper respiratory specimens during the acute phase of infection. The lowest concentration of SARS-CoV-2 viral copies this assay can detect is 138 copies/mL. A negative result does not preclude SARS-Cov-2 infection and should not be used as the sole basis for treatment or other patient management decisions. A negative result may occur with  improper specimen collection/handling, submission of specimen other than nasopharyngeal swab, presence of viral mutation(s) within the areas targeted by this assay, and inadequate number of viral copies(<138 copies/mL). A negative result must be combined with clinical observations, patient history, and  epidemiological information. The expected result is Negative.  Fact Sheet for Patients:  EntrepreneurPulse.com.au  Fact Sheet for Healthcare Providers:  IncredibleEmployment.be  This test is no                          t yet approved or cleared by the [place] FDA and  has been authorized for detection and/or diagnosis of SARS-CoV-2 by FDA under an Emergency Use Authorization (EUA). This EUA will remain  in effect (meaning this test can be used) for the duration of the COVID-19 declaration under Section 564(b)(1) of the Act, 21 U.S.C.section 360bbb-3(b)(1), unless the authorization is terminated  or revoked sooner.       Influenza  A by PCR 10/07/2022 NEGATIVE  NEGATIVE Final   Influenza B by PCR 10/07/2022 NEGATIVE  NEGATIVE Final   Comment: (NOTE) The Xpert Xpress SARS-CoV-2/FLU/RSV plus assay is intended as an aid in the diagnosis of influenza from Nasopharyngeal swab specimens and should not be used as a sole basis for treatment. Nasal washings and aspirates are unacceptable for Xpert Xpress SARS-CoV-2/FLU/RSV testing.  Fact Sheet for Patients: EntrepreneurPulse.com.au  Fact Sheet for Healthcare Providers: IncredibleEmployment.be  This test is not yet approved or cleared by the [place] FDA and has been authorized for detection and/or diagnosis of SARS-CoV-2 by FDA under an Emergency Use Authorization (EUA). This EUA will remain in effect (meaning this test can be used) for the duration of the COVID-19 declaration under Section 564(b)(1) of the Act, 21 U.S.C. section 360bbb-3(b)(1), unless the authorization is terminated or revoked.     Resp Syncytial Virus by PCR 10/07/2022 NEGATIVE  NEGATIVE Final   Comment: (NOTE) Fact Sheet for Patients: EntrepreneurPulse.com.au  Fact Sheet for Healthcare Providers: IncredibleEmployment.be  This test is not yet  approved or cleared by the [place] FDA and has been authorized for detection and/or diagnosis of SARS-CoV-2 by FDA under an Emergency Use Authorization (EUA). This EUA will remain in effect (meaning this test can be used) for the duration of the COVID-19 declaration under Section 564(b)(1) of the Act, 21 U.S.C. section 360bbb-3(b)(1), unless the authorization is terminated or revoked.  Performed at [facility], [place] [address] [place], [place] [address]    Specimen Description 10/07/2022 URINE, CLEAN CATCH   Final   Special Requests 10/07/2022    Final                   Value:NONE Performed at [facility], [place] [address], [place], [place] [address]    Culture 10/07/2022 MULTIPLE SPECIES PRESENT, SUGGEST RECOLLECTION (A)   Final   Report Status 10/07/2022 10/08/2022 FINAL   Final  Admission on 07/25/2022, Discharged on 10/07/2022  Component Date Value Ref Range Status   SARS Coronavirus 2 by RT PCR 07/25/2022 NEGATIVE  NEGATIVE Final   Comment: (NOTE) SARS-CoV-2 target nucleic acids are NOT DETECTED.  The SARS-CoV-2 RNA is generally detectable in upper respiratory specimens during the acute phase of infection. The lowest concentration of SARS-CoV-2 viral copies this assay can detect is 138 copies/mL. A negative result does not preclude SARS-Cov-2 infection and should not be used as the sole basis for treatment or other patient management decisions. A negative result may occur with  improper specimen collection/handling, submission of specimen other than nasopharyngeal swab, presence of viral mutation(s) within the areas targeted by this assay, and inadequate number of viral copies(<138 copies/mL). A negative result must be combined with clinical observations, patient history, and epidemiological information. The expected result is Negative.  Fact Sheet for Patients:  EntrepreneurPulse.com.au  Fact Sheet for Healthcare Providers:   IncredibleEmployment.be  This test is no                          t yet approved or cleared by the [place] FDA and  has been authorized for detection and/or diagnosis of SARS-CoV-2 by FDA under an Emergency Use Authorization (EUA). This EUA will remain  in effect (meaning this test can be used) for the duration of the COVID-19 declaration under Section 564(b)(1) of the Act, 21 U.S.C.section 360bbb-3(b)(1), unless the authorization is terminated  or revoked sooner.  Influenza A by PCR 07/25/2022 NEGATIVE  NEGATIVE Final   Influenza B by PCR 07/25/2022 NEGATIVE  NEGATIVE Final   Comment: (NOTE) The Xpert Xpress SARS-CoV-2/FLU/RSV plus assay is intended as an aid in the diagnosis of influenza from Nasopharyngeal swab specimens and should not be used as a sole basis for treatment. Nasal washings and aspirates are unacceptable for Xpert Xpress SARS-CoV-2/FLU/RSV testing.  Fact Sheet for Patients: EntrepreneurPulse.com.au  Fact Sheet for Healthcare Providers: IncredibleEmployment.be  This test is not yet approved or cleared by the [place] FDA and has been authorized for detection and/or diagnosis of SARS-CoV-2 by FDA under an Emergency Use Authorization (EUA). This EUA will remain in effect (meaning this test can be used) for the duration of the COVID-19 declaration under Section 564(b)(1) of the Act, 21 U.S.C. section 360bbb-3(b)(1), unless the authorization is terminated or revoked.  Performed at [facility], [place] [address] [place], [place] [address]    WBC 07/25/2022 8.3  4.0 - 10.5 K/uL Final   RBC 07/25/2022 4.44  3.87 - 5.11 MIL/uL Final   Hemoglobin 07/25/2022 13.7  12.0 - 15.0 g/dL Final   HCT 07/25/2022 40.2  36.0 - 46.0 % Final   MCV 07/25/2022 90.5  80.0 - 100.0 fL Final   MCH 07/25/2022 30.9  26.0 - 34.0 pg Final   MCHC 07/25/2022 34.1  30.0 - 36.0 g/dL Final   RDW 07/25/2022 12.2  11.5  - 15.5 % Final   Platelets 07/25/2022 248  150 - 400 K/uL Final   nRBC 07/25/2022 0.0  0.0 - 0.2 % Final   Neutrophils Relative % 07/25/2022 43  % Final   Neutro Abs 07/25/2022 3.6  1.7 - 7.7 K/uL Final   Lymphocytes Relative 07/25/2022 52  % Final   Lymphs Abs 07/25/2022 4.2 (H)  0.7 - 4.0 K/uL Final   Monocytes Relative 07/25/2022 5  % Final   Monocytes Absolute 07/25/2022 0.4  0.1 - 1.0 K/uL Final   Eosinophils Relative 07/25/2022 0  % Final   Eosinophils Absolute 07/25/2022 0.0  0.0 - 0.5 K/uL Final   Basophils Relative 07/25/2022 0  % Final   Basophils Absolute 07/25/2022 0.0  0.0 - 0.1 K/uL Final   Immature Granulocytes 07/25/2022 0  % Final   Abs Immature Granulocytes 07/25/2022 0.02  0.00 - 0.07 K/uL Final   Performed at [facility], [place] [address] [place], [place] [address]   Sodium 07/25/2022 138  135 - 145 mmol/L Final   Potassium 07/25/2022 4.0  3.5 - 5.1 mmol/L Final   Chloride 07/25/2022 104  98 - 111 mmol/L Final   CO2 07/25/2022 29  22 - 32 mmol/L Final   Glucose, Bld 07/25/2022 83  70 - 99 mg/dL Final   Glucose reference range applies only to samples taken after fasting for at least 8 hours.   BUN 07/25/2022 11  6 - 20 mg/dL Final   Creatinine, Ser 07/25/2022 0.97  0.44 - 1.00 mg/dL Final   Calcium 07/25/2022 9.2  8.9 - 10.3 mg/dL Final   Total Protein 07/25/2022 7.0  6.5 - 8.1 g/dL Final   Albumin 07/25/2022 3.8  3.5 - 5.0 g/dL Final   AST 07/25/2022 18  15 - 41 U/L Final   ALT 07/25/2022 22  0 - 44 U/L Final   Alkaline Phosphatase 07/25/2022 64  38 - 126 U/L Final   Total Bilirubin 07/25/2022 0.2 (L)  0.3 - 1.2 mg/dL Final   GFR, Estimated 07/25/2022 >60  >  60 mL/min Final   Comment: (NOTE) Calculated using the CKD-EPI Creatinine Equation (2021)    Anion gap 07/25/2022 5  5 - 15 Final   Performed at [facility], [place] [address], [place], [place] [address]   POC Amphetamine UR 07/25/2022 None Detected  NONE DETECTED (Cut Off Level 1000 ng/mL)  Preliminary   POC Secobarbital (BAR) 07/25/2022 None Detected  NONE DETECTED (Cut Off Level 300 ng/mL) Preliminary   POC Buprenorphine (BUP) 07/25/2022 None Detected  NONE DETECTED (Cut Off Level 10 ng/mL) Preliminary   POC Oxazepam (BZO) 07/25/2022 None Detected  NONE DETECTED (Cut Off Level 300 ng/mL) Preliminary   POC Cocaine UR 07/25/2022 None Detected  NONE DETECTED (Cut Off Level 300 ng/mL) Preliminary   POC Methamphetamine UR 07/25/2022 None Detected  NONE DETECTED (Cut Off Level 1000 ng/mL) Preliminary   POC Morphine 07/25/2022 None Detected  NONE DETECTED (Cut Off Level 300 ng/mL) Preliminary   POC Methadone UR 07/25/2022 None Detected  NONE DETECTED (Cut Off Level 300 ng/mL) Preliminary   POC Oxycodone UR 07/25/2022 None Detected  NONE DETECTED (Cut Off Level 100 ng/mL) Preliminary   POC Marijuana UR 07/25/2022 None Detected  NONE DETECTED (Cut Off Level 50 ng/mL) Preliminary   SARSCOV2ONAVIRUS 2 AG 07/25/2022 NEGATIVE  NEGATIVE Final   Comment: (NOTE) SARS-CoV-2 antigen NOT DETECTED.   Negative results are presumptive.  Negative results do not preclude SARS-CoV-2 infection and should not be used as the sole basis for treatment or other patient management decisions, including infection  control decisions, particularly in the presence of clinical signs and  symptoms consistent with COVID-19, or in those who have been in contact with the virus.  Negative results must be combined with clinical observations, patient history, and epidemiological information. The expected result is Negative.  Fact Sheet for Patients: HandmadeRecipes.com.cy  Fact Sheet for Healthcare Providers: FuneralLife.at  This test is not yet approved or cleared by the [place] FDA and  has been authorized for detection and/or diagnosis of SARS-CoV-2 by FDA under an Emergency Use Authorization (EUA).  This EUA will remain in effect (meaning this test can be  used) for the duration of  the COV                          ID-19 declaration under Section 564(b)(1) of the Act, 21 U.S.C. section 360bbb-3(b)(1), unless the authorization is terminated or revoked sooner.     Cholesterol 07/25/2022 181  0 - 200 mg/dL Final   Triglycerides 07/25/2022 118  <150 mg/dL Final   HDL 07/25/2022 45  >40 mg/dL Final   Total CHOL/HDL Ratio 07/25/2022 4.0  RATIO Final   VLDL 07/25/2022 24  0 - 40 mg/dL Final   LDL Cholesterol 07/25/2022 112 (H)  0 - 99 mg/dL Final   Comment:        Total Cholesterol/HDL:CHD Risk Coronary Heart Disease Risk Table                     Men   Women  1/2 Average Risk   3.4   3.3  Average Risk       5.0   4.4  2 X Average Risk   9.6   7.1  3 X Average Risk  23.4   11.0        Use the calculated Patient Ratio above and the CHD Risk Table to determine the patient's CHD Risk.  ATP III CLASSIFICATION (LDL):  <100     mg/dL   Optimal  100-129  mg/dL   Near or Above                    Optimal  130-159  mg/dL   Borderline  160-189  mg/dL   High  >190     mg/dL   Very High Performed at [facility] [address] [place], [place] [address]    TSH 07/25/2022 6.668 (H)  0.350 - 4.500 uIU/mL Final   Comment: Performed by a 3rd Generation assay with a functional sensitivity of <=0.01 uIU/mL. Performed at [facility], [place] [address] [place], [place] [address]    Glucose-Capillary 07/26/2022 104 (H)  70 - 99 mg/dL Final   Glucose reference range applies only to samples taken after fasting for at least 8 hours.   T3, Free 07/31/2022 2.3  2.0 - 4.4 pg/mL Final   Comment: (NOTE) Performed At: [facility] [place], [place] [id number] Rush Farmer MD [id number]    Free T4 07/31/2022 0.60 (L)  0.61 - 1.12 ng/dL Final   Comment: (NOTE) Biotin ingestion may interfere with free T4 tests. If the results are inconsistent with the TSH level, previous test results, or the clinical  presentation, then consider biotin interference. If needed, order repeat testing after stopping biotin. Performed at [facility], [place] [address] [place], [place] [address]    Glucose-Capillary 08/29/2022 100 (H)  70 - 99 mg/dL Final   Glucose reference range applies only to samples taken after fasting for at least 8 hours.   TSH 09/05/2022 0.793  0.350 - 4.500 uIU/mL Final   Comment: Performed by a 3rd Generation assay with a functional sensitivity of <=0.01 uIU/mL. Performed at [facility], [place] [address], [place], [place] [address]    Free T4 09/05/2022 0.73  0.61 - 1.12 ng/dL Final   Comment: (NOTE) Biotin ingestion may interfere with free T4 tests. If the results are inconsistent with the TSH level, previous test results, or the clinical presentation, then consider biotin interference. If needed, order repeat testing after stopping biotin. Performed at [facility], [place] [address] [place], [place] [address]    Preg Test, Ur 09/06/2022 Negative  Negative Final   Preg Test, Ur 09/06/2022 NEGATIVE  NEGATIVE Final   Comment:        THE SENSITIVITY OF THIS METHODOLOGY IS >24 mIU/mL    Preg Test, Ur 09/05/2022 NEGATIVE  NEGATIVE Final   Comment:        THE SENSITIVITY OF THIS METHODOLOGY IS >24 mIU/mL    Sodium 09/13/2022 136  135 - 145 mmol/L Final   Potassium 09/13/2022 4.5  3.5 - 5.1 mmol/L Final   Chloride 09/13/2022 105  98 - 111 mmol/L Final   CO2 09/13/2022 21 (L)  22 - 32 mmol/L Final   Glucose, Bld 09/13/2022 111 (H)  70 - 99 mg/dL Final   Glucose reference range applies only to samples taken after fasting for at least 8 hours.   BUN 09/13/2022 14  6 - 20 mg/dL Final   Creatinine, Ser 09/13/2022 0.92  0.44 - 1.00 mg/dL Final   Calcium 09/13/2022 9.1  8.9 - 10.3 mg/dL Final   GFR, Estimated 09/13/2022 >60  >60 mL/min Final   Comment: (NOTE) Calculated using the CKD-EPI Creatinine Equation (2021)    Anion gap 09/13/2022 10  5 - 15 Final  Performed at [facility], [place] [address], [place], [place] [address]   Vitamin B-12 09/13/2022 585  180 - 914 pg/mL Final   Comment: (NOTE) This assay is not validated for testing neonatal or myeloproliferative syndrome specimens for Vitamin B12 levels. Performed at [facility], [place] [address] [place], [place] [address]    Color, Urine 09/14/2022 YELLOW  YELLOW Final   APPearance 09/14/2022 HAZY (A)  CLEAR Final   Specific Gravity, Urine 09/14/2022 1.025  1.005 - 1.030 Final   pH 09/14/2022 5.0  5.0 - 8.0 Final   Glucose, UA 09/14/2022 NEGATIVE  NEGATIVE mg/dL Final   Hgb urine dipstick 09/14/2022 NEGATIVE  NEGATIVE Final   Bilirubin Urine 09/14/2022 NEGATIVE  NEGATIVE Final   Ketones, ur 09/14/2022 NEGATIVE  NEGATIVE mg/dL Final   Protein, ur 09/14/2022 NEGATIVE  NEGATIVE mg/dL Final   Nitrite 09/14/2022 NEGATIVE  NEGATIVE Final   Leukocytes,Ua 09/14/2022 TRACE (A)  NEGATIVE Final   RBC / HPF 09/14/2022 0-5  0 - 5 RBC/hpf Final   WBC, UA 09/14/2022 0-5  0 - 5 WBC/hpf Final   Bacteria, UA 09/14/2022 RARE (A)  NONE SEEN Final   Squamous Epithelial / HPF 09/14/2022 0-5  0 - 5 Final   Mucus 09/14/2022 PRESENT   Final   Performed at [facility], [place] [address], [place], [place] [address]   Glucose-Capillary 09/16/2022 105 (H)  70 - 99 mg/dL Final   Glucose reference range applies only to samples taken after fasting for at least 8 hours.   SARS Coronavirus 2 by RT PCR 09/30/2022 NEGATIVE  NEGATIVE Final   Comment: (NOTE) SARS-CoV-2 target nucleic acids are NOT DETECTED.  The SARS-CoV-2 RNA is generally detectable in upper respiratory specimens during the acute phase of infection. The lowest concentration of SARS-CoV-2 viral copies this assay can detect is 138 copies/mL. A negative result does not preclude SARS-Cov-2 infection and should not be used as the sole basis for treatment or other patient management decisions. A negative result may occur with  improper  specimen collection/handling, submission of specimen other than nasopharyngeal swab, presence of viral mutation(s) within the areas targeted by this assay, and inadequate number of viral copies(<138 copies/mL). A negative result must be combined with clinical observations, patient history, and epidemiological information. The expected result is Negative.  Fact Sheet for Patients:  EntrepreneurPulse.com.au  Fact Sheet for Healthcare Providers:  IncredibleEmployment.be  This test is no                          t yet approved or cleared by the [place] FDA and  has been authorized for detection and/or diagnosis of SARS-CoV-2 by FDA under an Emergency Use Authorization (EUA). This EUA will remain  in effect (meaning this test can be used) for the duration of the COVID-19 declaration under Section 564(b)(1) of the Act, 21 U.S.C.section 360bbb-3(b)(1), unless the authorization is terminated  or revoked sooner.       Influenza A by PCR 09/30/2022 NEGATIVE  NEGATIVE Final   Influenza B by PCR 09/30/2022 NEGATIVE  NEGATIVE Final   Comment: (NOTE) The Xpert Xpress SARS-CoV-2/FLU/RSV plus assay is intended as an aid in the diagnosis of influenza from Nasopharyngeal swab specimens and should not be used as a sole basis for treatment. Nasal washings and aspirates are unacceptable for Xpert Xpress SARS-CoV-2/FLU/RSV testing.  Fact Sheet for Patients: EntrepreneurPulse.com.au  Fact Sheet for Healthcare Providers: IncredibleEmployment.be  This test  is not yet approved or cleared by the [place] and has been authorized for detection and/or diagnosis of SARS-CoV-2 by FDA under an Emergency Use Authorization (EUA). This EUA will remain in effect (meaning this test can be used) for the duration of the COVID-19 declaration under Section 564(b)(1) of the Act, 21 U.S.C. section 360bbb-3(b)(1), unless the  authorization is terminated or revoked.     Resp Syncytial Virus by PCR 09/30/2022 NEGATIVE  NEGATIVE Final   Comment: (NOTE) Fact Sheet for Patients: EntrepreneurPulse.com.au  Fact Sheet for Healthcare Providers: IncredibleEmployment.be  This test is not yet approved or cleared by the [place] FDA and has been authorized for detection and/or diagnosis of SARS-CoV-2 by FDA under an Emergency Use Authorization (EUA). This EUA will remain in effect (meaning this test can be used) for the duration of the COVID-19 declaration under Section 564(b)(1) of the Act, 21 U.S.C. section 360bbb-3(b)(1), unless the authorization is terminated or revoked.  Performed at [facility], [place] [address] [place], [place] [address]    Sodium 10/01/2022 136  135 - 145 mmol/L Final   Potassium 10/01/2022 3.8  3.5 - 5.1 mmol/L Final   Chloride 10/01/2022 104  98 - 111 mmol/L Final   CO2 10/01/2022 27  22 - 32 mmol/L Final   Glucose, Bld 10/01/2022 98  70 - 99 mg/dL Final   Glucose reference range applies only to samples taken after fasting for at least 8 hours.   BUN 10/01/2022 12  6 - 20 mg/dL Final   Creatinine, Ser 10/01/2022 0.98  0.44 - 1.00 mg/dL Final   Calcium 10/01/2022 8.9  8.9 - 10.3 mg/dL Final   GFR, Estimated 10/01/2022 >60  >60 mL/min Final   Comment: (NOTE) Calculated using the CKD-EPI Creatinine Equation (2021)    Anion gap 10/01/2022 5  5 - 15 Final   Performed at [facility], [place] [address] [place], [place] [address]   WBC 10/01/2022 5.1  4.0 - 10.5 K/uL Final   RBC 10/01/2022 4.31  3.87 - 5.11 MIL/uL Final   Hemoglobin 10/01/2022 12.7  12.0 - 15.0 g/dL Final   HCT 10/01/2022 38.8  36.0 - 46.0 % Final   MCV 10/01/2022 90.0  80.0 - 100.0 fL Final   MCH 10/01/2022 29.5  26.0 - 34.0 pg Final   MCHC 10/01/2022 32.7  30.0 - 36.0 g/dL Final   RDW 10/01/2022 12.4  11.5 - 15.5 % Final   Platelets 10/01/2022 300  150 - 400 K/uL  Final   nRBC 10/01/2022 0.0  0.0 - 0.2 % Final   Performed at [place] [address] [place], [place] [address]   Lipase 10/01/2022 29  11 - 51 U/L Final   Performed at [place] [facility] [address] [place], [place] [address]   Color, Urine 10/05/2022 YELLOW  YELLOW Final   APPearance 10/05/2022 HAZY (A)  CLEAR Final   Specific Gravity, Urine 10/05/2022 1.018  1.005 - 1.030 Final   pH 10/05/2022 5.0  5.0 - 8.0 Final   Glucose, UA 10/05/2022 NEGATIVE  NEGATIVE mg/dL Final   Hgb urine dipstick 10/05/2022 NEGATIVE  NEGATIVE Final   Bilirubin Urine 10/05/2022 NEGATIVE  NEGATIVE Final   Ketones, ur 10/05/2022 NEGATIVE  NEGATIVE mg/dL Final   Protein, ur 10/05/2022 NEGATIVE  NEGATIVE mg/dL Final   Nitrite 10/05/2022 NEGATIVE  NEGATIVE Final   Leukocytes,Ua 10/05/2022 TRACE (A)  NEGATIVE Final   RBC / HPF 10/05/2022 0-5  0 - 5 RBC/hpf Final   WBC, UA 10/05/2022 0-5  0 - 5 WBC/hpf Final   Bacteria, UA 10/05/2022 RARE (A)  NONE SEEN Final   Squamous Epithelial / HPF 10/05/2022 0-5  0 - 5 Final   Mucus 10/05/2022 PRESENT   Final   Performed at [facility], [place] [address] [place], [place] [address]   Specimen Description 10/05/2022 URINE, CLEAN CATCH   Final   Special Requests 10/05/2022    Final                   Value:NONE Performed at [facility], [place] [address] [place],  [address]    Culture 10/05/2022 10,000 COLONIES/mL MULTIPLE SPECIES PRESENT, SUGGEST RECOLLECTION (A)   Final   Report Status 10/05/2022 10/07/2022 FINAL   Final  Admission on 07/04/2022, Discharged on 07/05/2022  Component Date Value Ref Range Status   Sodium 07/04/2022 142  135 - 145 mmol/L Final   Potassium 07/04/2022 4.4  3.5 - 5.1 mmol/L Final   Chloride 07/04/2022 109  98 - 111 mmol/L Final   CO2 07/04/2022 26  22 - 32 mmol/L Final   Glucose, Bld 07/04/2022 96  70 - 99 mg/dL Final   Glucose reference range applies only to samples taken after fasting for at least 8 hours.    BUN 07/04/2022 15  6 - 20 mg/dL Final   Creatinine, Ser 07/04/2022 0.83  0.44 - 1.00 mg/dL Final   Calcium 07/04/2022 9.3  8.9 - 10.3 mg/dL Final   Total Protein 07/04/2022 7.2  6.5 - 8.1 g/dL Final   Albumin 07/04/2022 3.9  3.5 - 5.0 g/dL Final   AST 07/04/2022 23  15 - 41 U/L Final   ALT 07/04/2022 28  0 - 44 U/L Final   Alkaline Phosphatase 07/04/2022 77  38 - 126 U/L Final   Total Bilirubin 07/04/2022 0.4  0.3 - 1.2 mg/dL Final   GFR, Estimated 07/04/2022 >60  >60 mL/min Final   Comment: (NOTE) Calculated using the CKD-EPI Creatinine Equation (2021)    Anion gap 07/04/2022 7  5 - 15 Final   Performed at [facility], [place] [address] [place],  [address]   Alcohol, Ethyl (B) 07/04/2022 <10  <10 mg/dL Final   Comment: (NOTE) Lowest detectable limit for serum alcohol is 10 mg/dL.  For medical purposes only. Performed at [facility], [place] [address] [place], [place] [address]    WBC 07/04/2022 7.0  4.0 - 10.5 K/uL Final   RBC 07/04/2022 4.18  3.87 - 5.11 MIL/uL Final   Hemoglobin 07/04/2022 12.8  12.0 - 15.0 g/dL Final   HCT 07/04/2022 39.1  36.0 - 46.0 % Final   MCV 07/04/2022 93.5  80.0 - 100.0 fL Final   MCH 07/04/2022 30.6  26.0 - 34.0 pg Final   MCHC 07/04/2022 32.7  30.0 - 36.0 g/dL Final   RDW 07/04/2022 12.9  11.5 - 15.5 % Final   Platelets 07/04/2022 243  150 - 400 K/uL Final   nRBC 07/04/2022 0.0  0.0 - 0.2 % Final   Neutrophils Relative % 07/04/2022 43  % Final   Neutro Abs 07/04/2022 3.0  1.7 - 7.7 K/uL Final   Lymphocytes Relative 07/04/2022 50  % Final   Lymphs Abs 07/04/2022 3.5  0.7 - 4.0 K/uL Final   Monocytes Relative 07/04/2022 7  % Final   Monocytes Absolute 07/04/2022 0.5  0.1 - 1.0 K/uL Final  Eosinophils Relative 07/04/2022 0  % Final   Eosinophils Absolute 07/04/2022 0.0  0.0 - 0.5 K/uL Final   Basophils Relative 07/04/2022 0  % Final   Basophils Absolute 07/04/2022 0.0  0.0 - 0.1 K/uL Final   Immature  Granulocytes 07/04/2022 0  % Final   Abs Immature Granulocytes 07/04/2022 0.01  0.00 - 0.07 K/uL Final   Performed at [facility], [place] [address] [place], [place] [address]   I-stat hCG, quantitative 07/04/2022 <5.0  <5 mIU/mL Final   Comment 3 07/04/2022          Final   Comment:   GEST. AGE      CONC.  (mIU/mL)   <=1 WEEK        5 - 50     2 WEEKS       50 - 500     3 WEEKS       100 - 10,000     4 WEEKS     1,000 - 30,000        FEMALE AND NON-PREGNANT FEMALE:     LESS THAN 5 mIU/mL   Admission on 06/14/2022, Discharged on 06/14/2022  Component Date Value Ref Range Status   Color, UA 06/14/2022 yellow  yellow Final   Clarity, UA 06/14/2022 cloudy (A)  clear Final   Glucose, UA 06/14/2022 negative  negative mg/dL Final   Bilirubin, UA 06/14/2022 negative  negative Final   Ketones, POC UA 06/14/2022 negative  negative mg/dL Final   Spec Grav, UA 06/14/2022 1.025  1.010 - 1.025 Final   Blood, UA 06/14/2022 negative  negative Final   pH, UA 06/14/2022 7.5  5.0 - 8.0 Final   Protein Ur, POC 06/14/2022 =30 (A)  negative mg/dL Final   Urobilinogen, UA 06/14/2022 0.2  0.2 or 1.0 E.U./dL Final   Nitrite, UA 06/14/2022 Negative  Negative Final   Leukocytes, UA 06/14/2022 Negative  Negative Final   Preg Test, Ur 06/14/2022 Negative  Negative Final   Specimen Description 06/14/2022 URINE, CLEAN CATCH   Final   Special Requests 06/14/2022    Final                   Value:NONE Performed at [facility], [place] [address], [place], [place] [address]    Culture 06/14/2022 MULTIPLE SPECIES PRESENT, SUGGEST RECOLLECTION (A)   Final   Report Status 06/14/2022 06/16/2022 FINAL   Final  Admission on 06/07/2022, Discharged on 06/10/2022  Component Date Value Ref Range Status   SARS Coronavirus 2 by RT PCR 06/07/2022 NEGATIVE  NEGATIVE Final   Comment: (NOTE) SARS-CoV-2 target nucleic acids are NOT DETECTED.  The SARS-CoV-2 RNA is generally detectable in upper  respiratory specimens during the acute phase of infection. The lowest concentration of SARS-CoV-2 viral copies this assay can detect is 138 copies/mL. A negative result does not preclude SARS-Cov-2 infection and should not be used as the sole basis for treatment or other patient management decisions. A negative result may occur with  improper specimen collection/handling, submission of specimen other than nasopharyngeal swab, presence of viral mutation(s) within the areas targeted by this assay, and inadequate number of viral copies(<138 copies/mL). A negative result must be combined with clinical observations, patient history, and epidemiological information. The expected result is Negative.  Fact Sheet for Patients:  EntrepreneurPulse.com.au  Fact Sheet for Healthcare Providers:  IncredibleEmployment.be  This test is no  t yet approved or cleared by the [place] and  has been authorized for detection and/or diagnosis of SARS-CoV-2 by FDA under an Emergency Use Authorization (EUA). This EUA will remain  in effect (meaning this test can be used) for the duration of the COVID-19 declaration under Section 564(b)(1) of the Act, 21 U.S.C.section 360bbb-3(b)(1), unless the authorization is terminated  or revoked sooner.       Influenza A by PCR 06/07/2022 NEGATIVE  NEGATIVE Final   Influenza B by PCR 06/07/2022 NEGATIVE  NEGATIVE Final   Comment: (NOTE) The Xpert Xpress SARS-CoV-2/FLU/RSV plus assay is intended as an aid in the diagnosis of influenza from Nasopharyngeal swab specimens and should not be used as a sole basis for treatment. Nasal washings and aspirates are unacceptable for Xpert Xpress SARS-CoV-2/FLU/RSV testing.  Fact Sheet for Patients: BloggerCourse.com  Fact Sheet for Healthcare Providers: SeriousBroker.it  This test is not yet approved or  cleared by the [place] FDA and has been authorized for detection and/or diagnosis of SARS-CoV-2 by FDA under an Emergency Use Authorization (EUA). This EUA will remain in effect (meaning this test can be used) for the duration of the COVID-19 declaration under Section 564(b)(1) of the Act, 21 U.S.C. section 360bbb-3(b)(1), unless the authorization is terminated or revoked.  Performed at [facility], [address], [place], [place] [address]    WBC 06/07/2022 5.7  4.0 - 10.5 K/uL Final   RBC 06/07/2022 4.39  3.87 - 5.11 MIL/uL Final   Hemoglobin 06/07/2022 13.2  12.0 - 15.0 g/dL Final   HCT 57/47/3403 40.4  36.0 - 46.0 % Final   MCV 06/07/2022 92.0  80.0 - 100.0 fL Final   MCH 06/07/2022 30.1  26.0 - 34.0 pg Final   MCHC 06/07/2022 32.7  30.0 - 36.0 g/dL Final   RDW 70/96/4383 12.4  11.5 - 15.5 % Final   Platelets 06/07/2022 308  150 - 400 K/uL Final   nRBC 06/07/2022 0.0  0.0 - 0.2 % Final   Neutrophils Relative % 06/07/2022 42  % Final   Neutro Abs 06/07/2022 2.4  1.7 - 7.7 K/uL Final   Lymphocytes Relative 06/07/2022 54  % Final   Lymphs Abs 06/07/2022 3.1  0.7 - 4.0 K/uL Final   Monocytes Relative 06/07/2022 4  % Final   Monocytes Absolute 06/07/2022 0.2  0.1 - 1.0 K/uL Final   Eosinophils Relative 06/07/2022 0  % Final   Eosinophils Absolute 06/07/2022 0.0  0.0 - 0.5 K/uL Final   Basophils Relative 06/07/2022 0  % Final   Basophils Absolute 06/07/2022 0.0  0.0 - 0.1 K/uL Final   Immature Granulocytes 06/07/2022 0  % Final   Abs Immature Granulocytes 06/07/2022 0.01  0.00 - 0.07 K/uL Final   Performed at [facility], [address] [place], [place] [address]   Sodium 06/07/2022 139  135 - 145 mmol/L Final   Potassium 06/07/2022 4.0  3.5 - 5.1 mmol/L Final   Chloride 06/07/2022 104  98 - 111 mmol/L Final   CO2 06/07/2022 28  22 - 32 mmol/L Final   Glucose, Bld 06/07/2022 104 (H)  70 - 99 mg/dL Final   Glucose reference range applies only to samples taken  after fasting for at least 8 hours.   BUN 06/07/2022 8  6 - 20 mg/dL Final   Creatinine, Ser 06/07/2022 0.84  0.44 - 1.00 mg/dL Final   Calcium 37/54/3606 9.1  8.9 - 10.3 mg/dL Final  Total Protein 06/07/2022 6.9  6.5 - 8.1 g/dL Final   Albumin 06/07/2022 3.7  3.5 - 5.0 g/dL Final   AST 06/07/2022 19  15 - 41 U/L Final   ALT 06/07/2022 22  0 - 44 U/L Final   Alkaline Phosphatase 06/07/2022 54  38 - 126 U/L Final   Total Bilirubin 06/07/2022 0.4  0.3 - 1.2 mg/dL Final   GFR, Estimated 06/07/2022 >60  >60 mL/min Final   Comment: (NOTE) Calculated using the CKD-EPI Creatinine Equation (2021)    Anion gap 06/07/2022 7  5 - 15 Final   Performed at [facility], [place] [address] [place], [place] [address]   Hgb A1c MFr Bld 06/07/2022 5.0  4.8 - 5.6 % Final   Comment: (NOTE) Pre diabetes:          5.7%-6.4%  Diabetes:              >6.4%  Glycemic control for   <7.0% adults with diabetes    Mean Plasma Glucose 06/07/2022 96.8  mg/dL Final   Performed at [facility], [place] [address] [place], [place] [address]   TSH 06/07/2022 1.620  0.350 - 4.500 uIU/mL Final   Comment: Performed by a 3rd Generation assay with a functional sensitivity of <=0.01 uIU/mL. Performed at [facility], [place] [address], [place], [place] [address]    RPR Ser Ql 06/07/2022 NON REACTIVE  NON REACTIVE Final   Performed at [facility], [place] [address] [place], [place] [address]   Color, Urine 06/07/2022 YELLOW  YELLOW Final   APPearance 06/07/2022 HAZY (A)  CLEAR Final   Specific Gravity, Urine 06/07/2022 1.018  1.005 - 1.030 Final   pH 06/07/2022 7.0  5.0 - 8.0 Final   Glucose, UA 06/07/2022 NEGATIVE  NEGATIVE mg/dL Final   Hgb urine dipstick 06/07/2022 NEGATIVE  NEGATIVE Final   Bilirubin Urine 06/07/2022 NEGATIVE  NEGATIVE Final   Ketones, ur 06/07/2022 NEGATIVE  NEGATIVE mg/dL Final   Protein, ur 06/07/2022 NEGATIVE  NEGATIVE mg/dL Final   Nitrite 06/07/2022 NEGATIVE  NEGATIVE Final    Leukocytes,Ua 06/07/2022 NEGATIVE  NEGATIVE Final   Performed at [facility], [place] [address] [place], [place] [address]   Cholesterol 06/07/2022 164  0 - 200 mg/dL Final   Triglycerides 06/07/2022 158 (H)  <150 mg/dL Final   HDL 06/07/2022 44  >40 mg/dL Final   Total CHOL/HDL Ratio 06/07/2022 3.7  RATIO Final   VLDL 06/07/2022 32  0 - 40 mg/dL Final   LDL Cholesterol 06/07/2022 88  0 - 99 mg/dL Final   Comment:        Total Cholesterol/HDL:CHD Risk Coronary Heart Disease Risk Table                     Men   Women  1/2 Average Risk   3.4   3.3  Average Risk       5.0   4.4  2 X Average Risk   9.6   7.1  3 X Average Risk  23.4   11.0        Use the calculated Patient Ratio above and the CHD Risk Table to determine the patient's CHD Risk.        ATP III CLASSIFICATION (LDL):  <100     mg/dL   Optimal  100-129  mg/dL   Near or Above  Optimal  130-159  mg/dL   Borderline  160-189  mg/dL   High  >190     mg/dL   Very High Performed at [place] [address], [place], [place] [address]    HIV Screen 4th Generation wRfx 06/07/2022 Non Reactive  Non Reactive Final   Performed at [facility], [place] [address], [place], [place] [address]   SARSCOV2ONAVIRUS 2 AG 06/07/2022 NEGATIVE  NEGATIVE Final   Comment: (NOTE) SARS-CoV-2 antigen NOT DETECTED.   Negative results are presumptive.  Negative results do not preclude SARS-CoV-2 infection and should not be used as the sole basis for treatment or other patient management decisions, including infection  control decisions, particularly in the presence of clinical signs and  symptoms consistent with COVID-19, or in those who have been in contact with the virus.  Negative results must be combined with clinical observations, patient history, and epidemiological information. The expected result is Negative.  Fact Sheet for Patients: HandmadeRecipes.com.cy  Fact Sheet for  Healthcare Providers: FuneralLife.at  This test is not yet approved or cleared by the [place] FDA and  has been authorized for detection and/or diagnosis of SARS-CoV-2 by FDA under an Emergency Use Authorization (EUA).  This EUA will remain in effect (meaning this test can be used) for the duration of  the COV                          ID-19 declaration under Section 564(b)(1) of the Act, 21 U.S.C. section 360bbb-3(b)(1), unless the authorization is terminated or revoked sooner.     POC Amphetamine UR 06/07/2022 None Detected  NONE DETECTED (Cut Off Level 1000 ng/mL) Final   POC Secobarbital (BAR) 06/07/2022 None Detected  NONE DETECTED (Cut Off Level 300 ng/mL) Final   POC Buprenorphine (BUP) 06/07/2022 None Detected  NONE DETECTED (Cut Off Level 10 ng/mL) Final   POC Oxazepam (BZO) 06/07/2022 None Detected  NONE DETECTED (Cut Off Level 300 ng/mL) Final   POC Cocaine UR 06/07/2022 None Detected  NONE DETECTED (Cut Off Level 300 ng/mL) Final   POC Methamphetamine UR 06/07/2022 None Detected  NONE DETECTED (Cut Off Level 1000 ng/mL) Final   POC Morphine 06/07/2022 None Detected  NONE DETECTED (Cut Off Level 300 ng/mL) Final   POC Methadone UR 06/07/2022 None Detected  NONE DETECTED (Cut Off Level 300 ng/mL) Final   POC Oxycodone UR 06/07/2022 None Detected  NONE DETECTED (Cut Off Level 100 ng/mL) Final   POC Marijuana UR 06/07/2022 None Detected  NONE DETECTED (Cut Off Level 50 ng/mL) Final   Preg Test, Ur 06/08/2022 NEGATIVE  NEGATIVE Final   Comment:        THE SENSITIVITY OF THIS METHODOLOGY IS >20 mIU/mL. Performed at [facility], [place] [address], [place], [place] [address]    Preg Test, Ur 06/08/2022 NEGATIVE  NEGATIVE Final   Comment:        THE SENSITIVITY OF THIS METHODOLOGY IS >24 mIU/mL     Blood Alcohol level:  Lab Results  Component Value Date   ETH <10 07/04/2022   ETH <10 [id number]    Metabolic Disorder Labs: Lab Results   Component Value Date   HGBA1C 5.7 (H) 10/15/2022   MPG 117 10/15/2022   MPG 96.8 06/07/2022   No results found for: "PROLACTIN" Lab Results  Component Value Date   CHOL 181 07/25/2022   TRIG 118 07/25/2022   HDL 45 07/25/2022  CHOLHDL 4.0 07/25/2022   VLDL 24 07/25/2022   LDLCALC 112 (H) 07/25/2022   LDLCALC 88 06/07/2022    Therapeutic Lab Levels: No results found for: "LITHIUM" No results found for: "VALPROATE" No results found for: "CBMZ"  Physical Findings   GAD-7    Flowsheet Row Office Visit from 12/17/2021 in [place]  Total GAD-7 Score 17      PHQ2-9    [facility] from 06/07/2022 in [facility] Office Visit from 12/17/2021 in [facility]  PHQ-2 Total Score 1 2  PHQ-9 Total Score 2 8      Flowsheet Row ED from 10/07/2022 in [facility] Most recent reading at 10/07/2022  8:16 PM ED from 10/07/2022 in [facility] Most recent reading at 10/07/2022 12:18 PM ED from 07/25/2022 in [facility] Most recent reading at 10/06/2022  9:38 AM  C-SSRS RISK CATEGORY No Risk No Risk No Risk        Musculoskeletal  Strength & Muscle Tone: within normal limits Gait & Station: normal Patient leans: N/A  Psychiatric Specialty Exam  Presentation  General Appearance:  Appropriate for Environment  Eye Contact: Good  Speech: Clear and Coherent; Normal Rate  Speech Volume: Normal  Handedness: Right   Mood and Affect  Mood: Euthymic  Affect: Appropriate; Congruent   Thought Process  Thought Processes: Coherent; Goal Directed  Descriptions of Associations:Intact  Orientation:Full (Time, Place and Person)  Thought Content:Logical; WDL  Diagnosis of Schizophrenia or Schizoaffective disorder in past: No    Hallucinations:No data recorded  Ideas of  Reference:None  Suicidal Thoughts:No data recorded  Homicidal Thoughts:No data recorded   Sensorium  Memory: Immediate Good; Recent Good  Judgment: Intact  Insight: Present   Executive Functions  Concentration: Good  Attention Span: Good  Recall: Good  Fund of Knowledge: Good  Language: Good   Psychomotor Activity  Psychomotor Activity: No data recorded   Assets  Assets: Communication Skills; Desire for Improvement; Leisure Time   Sleep  Sleep: No data recorded   No data recorded   Physical Exam  Physical Exam Vitals and nursing note reviewed.  Constitutional:      General: She is not in acute distress.    Appearance: She is not ill-appearing or diaphoretic.  HENT:     Head: Normocephalic and atraumatic.  Pulmonary:     Effort: Pulmonary effort is normal. No respiratory distress.  Neurological:     General: No focal deficit present.     Mental Status: She is alert.    Review of Systems  Constitutional: Negative.  Negative for chills and fever.  HENT:  Positive for congestion, ear pain and sinus pain. Negative for sore throat.   Respiratory:  Negative for cough, shortness of breath and wheezing.   Cardiovascular:  Negative for chest pain and palpitations.  Gastrointestinal: Negative.   Genitourinary: Negative.   Musculoskeletal: Negative.   Skin: Negative.   Neurological: Negative.  Negative for headaches.  Psychiatric/Behavioral:  Negative for depression, hallucinations, substance abuse and suicidal ideas. The patient is not nervous/anxious and does not have insomnia.    Blood pressure 110/80, pulse 94, temperature 98.4 F (36.9 C), temperature source Oral, resp. rate 18, height 5\' 1"  (1.549 m), weight 198 lb (89.8 kg), SpO2 98 %. Body mass index is 37.41 kg/m.  Treatment Plan Summary: Patient continues to be psychiatrically cleared.  Will continue to  collaborated with social work/TOC and legal guardian for appropriate residential  facility.     Acute sinusitis, uncomplicated Sxs of congestion, ear pain and fullness, tender facial sinuses on palpation. No evidence of bacterial infection. Expect resolution within 10-14 days from initial sxs onset on 10/15/22. Continuing symptomatic tx per below. Started nasal saline flush Continued Claritin daily Continued Flonase daily  Clinical Course as of 10/18/22 1224  Fri Oct 18, 2022  [id number] Hemoglobin A1C(!): 5.7 [JN]    Clinical Course User Index [JN] Merrily Brittle, DO      Disposition:  Boarder. Patient remains psychiatrically stable and ready for dc. Barrier to [facility] is safe housing. Appreciated CSW for assistance in dispo planning.    Merrily Brittle, DO 10/18/2022 8:47 AM

## 2022-10-18 NOTE — ED Notes (Signed)
Pt sleeping at present.  No distress noted.  Monitoring for safety. 

## 2022-10-18 NOTE — ED Notes (Signed)
Pt A&O x 4, no distress noted, calm & cooperative,pleasant. Monitoring for safety. 

## 2022-10-18 NOTE — ED Notes (Signed)
Pt sleeping@this  time. Breathing even and unlabored. Will continue to monitor for sfatey

## 2022-10-18 NOTE — ED Notes (Signed)
Pt resting quietly  No distress noted.  Breathing even and unlabored, pt is in view of nursing station. Staff will continue to monitor for safety.  

## 2022-10-18 NOTE — Care Management (Signed)
OBS Care Management   Treatment Team Meeting  In attendance [place]: Care Coordinator  Roderic Ovens [place] : Patient  [name]:  [facility] Supervisor  Olam Idler ([place]) Lourena Simmonds  [facility]  Oletta Lamas; [place] Owner  The legal guardian and the father were not able to be in attendance.   Quentin Ore has been accepted to [facility] pending authorization from [place].   The following action items need to take place prior to her being able to be discharged    Dolphus Jenny has completed and submitted the single case agreement form because Dolphus Jenny is not in network with [place].  Merci will review Anika's single case agreement with her supervisor in order to have the authorization expediated.  Mercie will submit the authorization for an enhance rate request  Merci will begin the process to have the patient on the [facility] wait list The father has an court date on October 21, 2022 at 3p to have guardianship changed from the mother to the father Once legal guardianship uis changed themn Merci will have the father to sign the intake paperwork for the patient to be admitted to [facility].  Follow up Treatment Team Meeting on October 21, 2022 at 10am

## 2022-10-19 DIAGNOSIS — F333 Major depressive disorder, recurrent, severe with psychotic symptoms: Secondary | ICD-10-CM | POA: Diagnosis not present

## 2022-10-19 DIAGNOSIS — E039 Hypothyroidism, unspecified: Secondary | ICD-10-CM | POA: Diagnosis not present

## 2022-10-19 DIAGNOSIS — F17200 Nicotine dependence, unspecified, uncomplicated: Secondary | ICD-10-CM | POA: Diagnosis not present

## 2022-10-19 DIAGNOSIS — F419 Anxiety disorder, unspecified: Secondary | ICD-10-CM | POA: Diagnosis not present

## 2022-10-19 DIAGNOSIS — Z1152 Encounter for screening for COVID-19: Secondary | ICD-10-CM | POA: Diagnosis not present

## 2022-10-19 DIAGNOSIS — N9489 Other specified conditions associated with female genital organs and menstrual cycle: Secondary | ICD-10-CM | POA: Diagnosis not present

## 2022-10-19 DIAGNOSIS — N390 Urinary tract infection, site not specified: Secondary | ICD-10-CM | POA: Diagnosis not present

## 2022-10-19 DIAGNOSIS — F209 Schizophrenia, unspecified: Secondary | ICD-10-CM | POA: Diagnosis not present

## 2022-10-19 DIAGNOSIS — E118 Type 2 diabetes mellitus with unspecified complications: Secondary | ICD-10-CM | POA: Diagnosis not present

## 2022-10-19 NOTE — ED Provider Notes (Signed)
Behavioral Health Progress Note  Date and Time: 10/19/2022 2:01 PM Name: Nicole [name] MRN:  [id number]  HPI Nicole [name] 32 y.o., female patient with schizophrenia, MDD with psychosis,  presented to [facility], voluntarily, as a walk-in accompanied by [employer] after a disagreement and verbal altercation with personnel at the group home.  She has been dismissed previously from other group homes due to threatening behaviors, and suicidal ideation and the one which she was residing in.  Patient was admitted to continuous assessment unit on 07/25/2022 as she also endorsed passive SI at that time along with symptoms of visual hallucination. Patient was subsequently psychiatrically cleared during her admission to the continuous assessment unit.  However patient remains here at [facility] bordering until adequate- residential placement can be obtained.     Legal Guardian: [name]) transitioning to be [name]) Point of contact: [name])   Tests administered: Wechsler Adult Intelligence Scale-4 (see Eloise Harman, PhD consult note on 08/19/2022 for full info) Vineland-3 Adaptive Behavior Scales Assessment completed on 09/13/2022 administered by Rosezetta Schlatter, MD  Nicole [name], 32 y.o., female patient seen face to face by this provider and chart reviewed on 10/19/22.    Subjective:    On today's evaluation Nicole [name] is observed sitting in her bed coloring. She is in no acute distress. She is alert, oriented x 4, calm, cooperative and attentive. She has normal speech and behavior.  She denies depression and anxiety.  She denies SI/HI/AVH.  She does not appear manic or psychotic.  She continues to express her frustration due to being on the unit for so long.  Provided reassurance and encouragement.  She has remained compliant and cooperative with staff while in the unit.  She has required no as needed medications for agitation.  She continues to endorse mild ear discomfort but does  admit that it is improving.  Patient continues to remain psychiatrically cleared.  Social work/DSS seeking placement.      Diagnosis:  Final diagnoses:  Tobacco use disorder  Schizophrenia, unspecified type ([place])  MDD (major depressive disorder), recurrent episode, moderate ([facility])  Hypothyroidism, unspecified type    Total Time spent with patient: 30 minutes  Past Psychiatric History: see H&P Past Medical History:  Past Medical History:  Diagnosis Date   Anxiety    Depression    Hypothyroidism 08/07/2022   Tobacco use disorder 08/07/2022    Past Surgical History:  Procedure Laterality Date   WISDOM TOOTH EXTRACTION Bilateral 2020   Family History:  Family History  Problem Relation Age of Onset   Hypertension Father    Diabetes Father    Family Psychiatric  History: See H&P Social History:  Social History   Substance and Sexual Activity  Alcohol Use Never     Social History   Substance and Sexual Activity  Drug Use Never    Social History   Socioeconomic History   Marital status: Single    Spouse name: Not on file   Number of children: Not on file   Years of education: Not on file   Highest education level: Not on file  Occupational History   Not on file  Tobacco Use   Smoking status: Every Day    Types: Cigarettes   Smokeless tobacco: Never  Vaping Use   Vaping Use: Some days  Substance and Sexual Activity   Alcohol use: Never   Drug use: Never   Sexual activity: Yes    Partners: Female  Birth control/protection: Implant  Other Topics Concern   Not on file  Social History Narrative   Not on file   Social Determinants of Health   Financial Resource Strain: Not on file  Food Insecurity: Not on file  Transportation Needs: Not on file  Physical Activity: Not on file  Stress: Not on file  Social Connections: Not on file   SDOH:  SDOH Screenings   Depression (PHQ2-9): Low Risk  (06/10/2022)  Tobacco Use: High Risk (10/16/2022)    Additional Social History:                         Sleep: Good  Appetite:  Good  Current Medications:  Current Facility-Administered Medications  Medication Dose Route Frequency Provider Last Rate Last Admin   acetaminophen (TYLENOL) tablet 650 mg  650 mg Oral Q6H PRN Maple Hudson, Veronique M, NP       alum & mag hydroxide-simeth (MAALOX/MYLANTA) 200-200-20 MG/5ML suspension 30 mL  30 mL Oral Q4H PRN Maple Hudson, Veronique M, NP   30 mL at 10/18/22 0512   carbamide peroxide (DEBROX) 6.5 % OTIC (EAR) solution 5 drop  5 drop Both EARS BID Merrily Brittle, DO   5 drop at 10/19/22 [id number]   cetaphil lotion   Topical PRN Merrily Brittle, DO       fluticasone [facility]) 50 MCG/ACT nasal spray 1 spray  1 spray Each Nare QHS Merrily Brittle, DO   1 spray at 10/18/22 2116   hydrOXYzine (ATARAX) tablet 25 mg  25 mg Oral TID PRN Christene Slates, MD   25 mg at 10/18/22 2116   levothyroxine (SYNTHROID) tablet 100 mcg  100 mcg Oral Once Byungura, Veronique M, NP       levothyroxine (SYNTHROID) tablet 100 mcg  100 mcg Oral Q0600 Haynes Kerns, NP   100 mcg at 10/19/22 [id number]   loratadine (CLARITIN) tablet 10 mg  10 mg Oral Daily Carrion-Carrero, Caryl Ada, MD   10 mg at 10/19/22 0954   magnesium hydroxide (MILK OF MAGNESIA) suspension 30 mL  30 mL Oral Daily PRN Haynes Kerns, NP   30 mL at 10/12/22 2145   metFORMIN (GLUCOPHAGE) tablet 500 mg  500 mg Oral Q breakfast Carrion-Carrero, Margely, MD   500 mg at 10/19/22 0954   nicotine (NICODERM CQ - dosed in mg/24 hours) patch 14 mg  14 mg Transdermal Daily PRN Carrion-Carrero, Caryl Ada, MD       ondansetron (ZOFRAN-ODT) disintegrating tablet 4 mg  4 mg Oral Q8H PRN Carrion-Carrero, Margely, MD   4 mg at 10/18/22 [id number]   Oxcarbazepine (TRILEPTAL) tablet 300 mg  300 mg Oral BID Maple Hudson, Veronique M, NP   300 mg at 10/19/22 0954   pantoprazole (PROTONIX) EC tablet 40 mg  40 mg Oral Daily Carrion-Carrero, Caryl Ada, MD   40 mg at 10/19/22 0954    polyethylene glycol (MIRALAX / GLYCOLAX) packet 17 g  17 g Oral Daily Merrily Brittle, DO   17 g at 10/19/22 0954   QUEtiapine (SEROQUEL) tablet 400 mg  400 mg Oral BID Ronelle Nigh M, NP   400 mg at 10/19/22 0954   sertraline (ZOLOFT) tablet 150 mg  150 mg Oral Daily Carrion-Carrero, Margely, MD   150 mg at 10/19/22 0954   sodium chloride (OCEAN) 0.65 % nasal spray 1 spray  1 spray Each Nare Daily Merrily Brittle, DO   1 spray at 10/19/22 0955   traZODone (DESYREL) tablet 100 mg  100 mg Oral QHS  Haynes Kerns, NP   100 mg at 10/18/22 2116   traZODone (DESYREL) tablet 50 mg  50 mg Oral QHS PRN Haynes Kerns, NP   50 mg at 10/18/22 2117   valACYclovir (VALTREX) tablet 500 mg  500 mg Oral Daily Carrion-Carrero, Caryl Ada, MD   500 mg at 10/19/22 [id number]   Current Outpatient Medications  Medication Sig Dispense Refill   ABILIFY MAINTENA 400 MG PRSY prefilled syringe 400 mg every 28 (twenty-eight) days.     cetirizine (ZYRTEC) 10 MG tablet Take 10 mg by mouth daily.     cyclobenzaprine (FLEXERIL) 10 MG tablet Take 1 tablet (10 mg total) by mouth 2 (two) times daily as needed for muscle spasms. 20 tablet 0   fluticasone (FLONASE) 50 MCG/ACT nasal spray Place 1 spray into both nostrils daily.     meloxicam (MOBIC) 15 MG tablet Take 15 mg by mouth daily.     nitrofurantoin, macrocrystal-monohydrate, (MACROBID) 100 MG capsule Take 1 capsule (100 mg total) by mouth 2 (two) times daily. 10 capsule 0   ondansetron (ZOFRAN-ODT) 4 MG disintegrating tablet Take 1 tablet (4 mg total) by mouth every 8 (eight) hours as needed for nausea or vomiting. 20 tablet 0   Oxcarbazepine (TRILEPTAL) 300 MG tablet Take 1 tablet (300 mg total) by mouth 2 (two) times daily. 60 tablet 0   QUEtiapine (SEROQUEL) 400 MG tablet Take 1 tablet (400 mg total) by mouth 2 (two) times daily. 60 tablet 0   sertraline (ZOLOFT) 50 MG tablet Take 3 tablets (150 mg total) by mouth in the morning. 90 tablet 0   traZODone  (DESYREL) 100 MG tablet Take 1 tablet (100 mg total) by mouth at bedtime. 30 tablet 0   valACYclovir (VALTREX) 500 MG tablet Take 500 mg by mouth daily.      Labs  Lab Results:  Admission on 10/07/2022  Component Date Value Ref Range Status   Hgb A1c MFr Bld 10/15/2022 5.7 (H)  4.8 - 5.6 % Final   Comment: (NOTE)         Prediabetes: 5.7 - 6.4         Diabetes: >6.4         Glycemic control for adults with diabetes: <7.0    Mean Plasma Glucose 10/15/2022 117  mg/dL Final   Comment: (NOTE) Performed At: [facility] [place], [place] [id number] Rush Farmer MD [id number]   Admission on 10/07/2022, Discharged on 10/07/2022  Component Date Value Ref Range Status   Color, Urine 10/07/2022 YELLOW  YELLOW Final   APPearance 10/07/2022 HAZY (A)  CLEAR Final   Specific Gravity, Urine 10/07/2022 1.011  1.005 - 1.030 Final   pH 10/07/2022 7.0  5.0 - 8.0 Final   Glucose, UA 10/07/2022 NEGATIVE  NEGATIVE mg/dL Final   Hgb urine dipstick 10/07/2022 SMALL (A)  NEGATIVE Final   Bilirubin Urine 10/07/2022 NEGATIVE  NEGATIVE Final   Ketones, ur 10/07/2022 NEGATIVE  NEGATIVE mg/dL Final   Protein, ur 10/07/2022 NEGATIVE  NEGATIVE mg/dL Final   Nitrite 10/07/2022 NEGATIVE  NEGATIVE Final   Leukocytes,Ua 10/07/2022 LARGE (A)  NEGATIVE Final   RBC / HPF 10/07/2022 0-5  0 - 5 RBC/hpf Final   WBC, UA 10/07/2022 0-5  0 - 5 WBC/hpf Final   Bacteria, UA 10/07/2022 FEW (A)  NONE SEEN Final   Squamous Epithelial / HPF 10/07/2022 11-20  0 - 5 Final   Performed at [facility], [place] [address], [place], [place]  [id number]   I-stat hCG, quantitative 10/07/2022 <5.0  <5 mIU/mL Final   Comment 3 10/07/2022          Final   Comment:   GEST. AGE      CONC.  (mIU/mL)   <=1 WEEK        5 - 50     2 WEEKS       50 - 500     3 WEEKS       100 - 10,000     4 WEEKS     1,000 - 30,000        FEMALE AND NON-PREGNANT FEMALE:     LESS THAN 5 mIU/mL    Sodium 10/07/2022 138  135 - 145  mmol/L Final   Potassium 10/07/2022 4.3  3.5 - 5.1 mmol/L Final   Chloride 10/07/2022 104  98 - 111 mmol/L Final   CO2 10/07/2022 28  22 - 32 mmol/L Final   Glucose, Bld 10/07/2022 90  70 - 99 mg/dL Final   Glucose reference range applies only to samples taken after fasting for at least 8 hours.   BUN 10/07/2022 8  6 - 20 mg/dL Final   Creatinine, Ser 10/07/2022 0.96  0.44 - 1.00 mg/dL Final   Calcium 10/07/2022 9.1  8.9 - 10.3 mg/dL Final   GFR, Estimated 10/07/2022 >60  >60 mL/min Final   Comment: (NOTE) Calculated using the CKD-EPI Creatinine Equation (2021)    Anion gap 10/07/2022 6  5 - 15 Final   Performed at [facility], [place] [address], [place], [place] [address]   WBC 10/07/2022 5.8  4.0 - 10.5 K/uL Final   RBC 10/07/2022 4.36  3.87 - 5.11 MIL/uL Final   Hemoglobin 10/07/2022 12.9  12.0 - 15.0 g/dL Final   HCT 10/07/2022 40.0  36.0 - 46.0 % Final   MCV 10/07/2022 91.7  80.0 - 100.0 fL Final   MCH 10/07/2022 29.6  26.0 - 34.0 pg Final   MCHC 10/07/2022 32.3  30.0 - 36.0 g/dL Final   RDW 10/07/2022 12.4  11.5 - 15.5 % Final   Platelets 10/07/2022 295  150 - 400 K/uL Final   nRBC 10/07/2022 0.0  0.0 - 0.2 % Final   Performed at [facility] [address], [place], [place] [address]   SARS Coronavirus 2 by RT PCR 10/07/2022 NEGATIVE  NEGATIVE Final   Comment: (NOTE) SARS-CoV-2 target nucleic acids are NOT DETECTED.  The SARS-CoV-2 RNA is generally detectable in upper respiratory specimens during the acute phase of infection. The lowest concentration of SARS-CoV-2 viral copies this assay can detect is 138 copies/mL. A negative result does not preclude SARS-Cov-2 infection and should not be used as the sole basis for treatment or other patient management decisions. A negative result may occur with  improper specimen collection/handling, submission of specimen other than nasopharyngeal swab, presence of viral mutation(s) within the areas targeted by this assay,  and inadequate number of viral copies(<138 copies/mL). A negative result must be combined with clinical observations, patient history, and epidemiological information. The expected result is Negative.  Fact Sheet for Patients:  EntrepreneurPulse.com.au  Fact Sheet for Healthcare Providers:  IncredibleEmployment.be  This test is no                          t yet approved or cleared by the [place] FDA and  has been authorized for detection and/or diagnosis of SARS-CoV-2  by FDA under an Emergency Use Authorization (EUA). This EUA will remain  in effect (meaning this test can be used) for the duration of the COVID-19 declaration under Section 564(b)(1) of the Act, 21 U.S.C.section 360bbb-3(b)(1), unless the authorization is terminated  or revoked sooner.       Influenza A by PCR 10/07/2022 NEGATIVE  NEGATIVE Final   Influenza B by PCR 10/07/2022 NEGATIVE  NEGATIVE Final   Comment: (NOTE) The Xpert Xpress SARS-CoV-2/FLU/RSV plus assay is intended as an aid in the diagnosis of influenza from Nasopharyngeal swab specimens and should not be used as a sole basis for treatment. Nasal washings and aspirates are unacceptable for Xpert Xpress SARS-CoV-2/FLU/RSV testing.  Fact Sheet for Patients: BloggerCourse.comhttps://www.fda.gov/media/152166/download  Fact Sheet for Healthcare Providers: SeriousBroker.ithttps://www.fda.gov/media/152162/download  This test is not yet approved or cleared by the [place] FDA and has been authorized for detection and/or diagnosis of SARS-CoV-2 by FDA under an Emergency Use Authorization (EUA). This EUA will remain in effect (meaning this test can be used) for the duration of the COVID-19 declaration under Section 564(b)(1) of the Act, 21 U.S.C. section 360bbb-3(b)(1), unless the authorization is terminated or revoked.     Resp Syncytial Virus by PCR 10/07/2022 NEGATIVE  NEGATIVE Final   Comment: (NOTE) Fact Sheet for  Patients: BloggerCourse.comhttps://www.fda.gov/media/152166/download  Fact Sheet for Healthcare Providers: SeriousBroker.ithttps://www.fda.gov/media/152162/download  This test is not yet approved or cleared by the [place] FDA and has been authorized for detection and/or diagnosis of SARS-CoV-2 by FDA under an Emergency Use Authorization (EUA). This EUA will remain in effect (meaning this test can be used) for the duration of the COVID-19 declaration under Section 564(b)(1) of the Act, 21 U.S.C. section 360bbb-3(b)(1), unless the authorization is terminated or revoked.  Performed at [facility], [address] [place], [place] [address]    Specimen Description 10/07/2022 URINE, CLEAN CATCH   Final   Special Requests 10/07/2022    Final                   Value:NONE Performed at [facility], [address] [place], [place] [address]    Culture 10/07/2022 MULTIPLE SPECIES PRESENT, SUGGEST RECOLLECTION (A)   Final   Report Status 10/07/2022 10/08/2022 FINAL   Final  Admission on 07/25/2022, Discharged on 10/07/2022  Component Date Value Ref Range Status   SARS Coronavirus 2 by RT PCR 07/25/2022 NEGATIVE  NEGATIVE Final   Comment: (NOTE) SARS-CoV-2 target nucleic acids are NOT DETECTED.  The SARS-CoV-2 RNA is generally detectable in upper respiratory specimens during the acute phase of infection. The lowest concentration of SARS-CoV-2 viral copies this assay can detect is 138 copies/mL. A negative result does not preclude SARS-Cov-2 infection and should not be used as the sole basis for treatment or other patient management decisions. A negative result may occur with  improper specimen collection/handling, submission of specimen other than nasopharyngeal swab, presence of viral mutation(s) within the areas targeted by this assay, and inadequate number of viral copies(<138 copies/mL). A negative result must be combined with clinical observations, patient history, and  epidemiological information. The expected result is Negative.  Fact Sheet for Patients:  BloggerCourse.comhttps://www.fda.gov/media/152166/download  Fact Sheet for Healthcare Providers:  SeriousBroker.ithttps://www.fda.gov/media/152162/download  This test is no                          t yet approved or cleared by the [place] FDA and  has been authorized for detection and/or  diagnosis of SARS-CoV-2 by FDA under an Emergency Use Authorization (EUA). This EUA will remain  in effect (meaning this test can be used) for the duration of the COVID-19 declaration under Section 564(b)(1) of the Act, 21 U.S.C.section 360bbb-3(b)(1), unless the authorization is terminated  or revoked sooner.       Influenza A by PCR 07/25/2022 NEGATIVE  NEGATIVE Final   Influenza B by PCR 07/25/2022 NEGATIVE  NEGATIVE Final   Comment: (NOTE) The Xpert Xpress SARS-CoV-2/FLU/RSV plus assay is intended as an aid in the diagnosis of influenza from Nasopharyngeal swab specimens and should not be used as a sole basis for treatment. Nasal washings and aspirates are unacceptable for Xpert Xpress SARS-CoV-2/FLU/RSV testing.  Fact Sheet for Patients: EntrepreneurPulse.com.au  Fact Sheet for Healthcare Providers: IncredibleEmployment.be  This test is not yet approved or cleared by the [place] FDA and has been authorized for detection and/or diagnosis of SARS-CoV-2 by FDA under an Emergency Use Authorization (EUA). This EUA will remain in effect (meaning this test can be used) for the duration of the COVID-19 declaration under Section 564(b)(1) of the Act, 21 U.S.C. section 360bbb-3(b)(1), unless the authorization is terminated or revoked.  Performed at [facility], [place] [address] [place], [place] [address]    WBC 07/25/2022 8.3  4.0 - 10.5 K/uL Final   RBC 07/25/2022 4.44  3.87 - 5.11 MIL/uL Final   Hemoglobin 07/25/2022 13.7  12.0 - 15.0 g/dL Final   HCT 07/25/2022 40.2  36.0 - 46.0  % Final   MCV 07/25/2022 90.5  80.0 - 100.0 fL Final   MCH 07/25/2022 30.9  26.0 - 34.0 pg Final   MCHC 07/25/2022 34.1  30.0 - 36.0 g/dL Final   RDW 07/25/2022 12.2  11.5 - 15.5 % Final   Platelets 07/25/2022 248  150 - 400 K/uL Final   nRBC 07/25/2022 0.0  0.0 - 0.2 % Final   Neutrophils Relative % 07/25/2022 43  % Final   Neutro Abs 07/25/2022 3.6  1.7 - 7.7 K/uL Final   Lymphocytes Relative 07/25/2022 52  % Final   Lymphs Abs 07/25/2022 4.2 (H)  0.7 - 4.0 K/uL Final   Monocytes Relative 07/25/2022 5  % Final   Monocytes Absolute 07/25/2022 0.4  0.1 - 1.0 K/uL Final   Eosinophils Relative 07/25/2022 0  % Final   Eosinophils Absolute 07/25/2022 0.0  0.0 - 0.5 K/uL Final   Basophils Relative 07/25/2022 0  % Final   Basophils Absolute 07/25/2022 0.0  0.0 - 0.1 K/uL Final   Immature Granulocytes 07/25/2022 0  % Final   Abs Immature Granulocytes 07/25/2022 0.02  0.00 - 0.07 K/uL Final   Performed at [facility], [place] [address], [place], [place] [address]   Sodium 07/25/2022 138  135 - 145 mmol/L Final   Potassium 07/25/2022 4.0  3.5 - 5.1 mmol/L Final   Chloride 07/25/2022 104  98 - 111 mmol/L Final   CO2 07/25/2022 29  22 - 32 mmol/L Final   Glucose, Bld 07/25/2022 83  70 - 99 mg/dL Final   Glucose reference range applies only to samples taken after fasting for at least 8 hours.   BUN 07/25/2022 11  6 - 20 mg/dL Final   Creatinine, Ser 07/25/2022 0.97  0.44 - 1.00 mg/dL Final   Calcium 07/25/2022 9.2  8.9 - 10.3 mg/dL Final   Total Protein 07/25/2022 7.0  6.5 - 8.1 g/dL Final   Albumin 07/25/2022 3.8  3.5 - 5.0  g/dL Final   AST 07/25/2022 18  15 - 41 U/L Final   ALT 07/25/2022 22  0 - 44 U/L Final   Alkaline Phosphatase 07/25/2022 64  38 - 126 U/L Final   Total Bilirubin 07/25/2022 0.2 (L)  0.3 - 1.2 mg/dL Final   GFR, Estimated 07/25/2022 >60  >60 mL/min Final   Comment: (NOTE) Calculated using the CKD-EPI Creatinine Equation (2021)    Anion gap 07/25/2022 5  5 - 15  Final   Performed at [facility], [place] [address] [place], [place] [address]   POC Amphetamine UR 07/25/2022 None Detected  NONE DETECTED (Cut Off Level 1000 ng/mL) Preliminary   POC Secobarbital (BAR) 07/25/2022 None Detected  NONE DETECTED (Cut Off Level 300 ng/mL) Preliminary   POC Buprenorphine (BUP) 07/25/2022 None Detected  NONE DETECTED (Cut Off Level 10 ng/mL) Preliminary   POC Oxazepam (BZO) 07/25/2022 None Detected  NONE DETECTED (Cut Off Level 300 ng/mL) Preliminary   POC Cocaine UR 07/25/2022 None Detected  NONE DETECTED (Cut Off Level 300 ng/mL) Preliminary   POC Methamphetamine UR 07/25/2022 None Detected  NONE DETECTED (Cut Off Level 1000 ng/mL) Preliminary   POC Morphine 07/25/2022 None Detected  NONE DETECTED (Cut Off Level 300 ng/mL) Preliminary   POC Methadone UR 07/25/2022 None Detected  NONE DETECTED (Cut Off Level 300 ng/mL) Preliminary   POC Oxycodone UR 07/25/2022 None Detected  NONE DETECTED (Cut Off Level 100 ng/mL) Preliminary   POC Marijuana UR 07/25/2022 None Detected  NONE DETECTED (Cut Off Level 50 ng/mL) Preliminary   SARSCOV2ONAVIRUS 2 AG 07/25/2022 NEGATIVE  NEGATIVE Final   Comment: (NOTE) SARS-CoV-2 antigen NOT DETECTED.   Negative results are presumptive.  Negative results do not preclude SARS-CoV-2 infection and should not be used as the sole basis for treatment or other patient management decisions, including infection  control decisions, particularly in the presence of clinical signs and  symptoms consistent with COVID-19, or in those who have been in contact with the virus.  Negative results must be combined with clinical observations, patient history, and epidemiological information. The expected result is Negative.  Fact Sheet for Patients: HandmadeRecipes.com.cy  Fact Sheet for Healthcare Providers: FuneralLife.at  This test is not yet approved or cleared by the [place] FDA and  has  been authorized for detection and/or diagnosis of SARS-CoV-2 by FDA under an Emergency Use Authorization (EUA).  This EUA will remain in effect (meaning this test can be used) for the duration of  the COV                          ID-19 declaration under Section 564(b)(1) of the Act, 21 U.S.C. section 360bbb-3(b)(1), unless the authorization is terminated or revoked sooner.     Cholesterol 07/25/2022 181  0 - 200 mg/dL Final   Triglycerides 07/25/2022 118  <150 mg/dL Final   HDL 07/25/2022 45  >40 mg/dL Final   Total CHOL/HDL Ratio 07/25/2022 4.0  RATIO Final   VLDL 07/25/2022 24  0 - 40 mg/dL Final   LDL Cholesterol 07/25/2022 112 (H)  0 - 99 mg/dL Final   Comment:        Total Cholesterol/HDL:CHD Risk Coronary Heart Disease Risk Table                     Men   Women  1/2 Average Risk   3.4   3.3  Average Risk  5.0   4.4  2 X Average Risk   9.6   7.1  3 X Average Risk  23.4   11.0        Use the calculated Patient Ratio above and the CHD Risk Table to determine the patient's CHD Risk.        ATP III CLASSIFICATION (LDL):  <100     mg/dL   Optimal  601-093  mg/dL   Near or Above                    Optimal  130-159  mg/dL   Borderline  235-573  mg/dL   High  >220     mg/dL   Very High Performed at [facility], [address] [place], [place] [address]    TSH 07/25/2022 6.668 (H)  0.350 - 4.500 uIU/mL Final   Comment: Performed by a 3rd Generation assay with a functional sensitivity of <=0.01 uIU/mL. Performed at [facility], [address] [place], [place] [address]    Glucose-Capillary 07/26/2022 104 (H)  70 - 99 mg/dL Final   Glucose reference range applies only to samples taken after fasting for at least 8 hours.   T3, Free 07/31/2022 2.3  2.0 - 4.4 pg/mL Final   Comment: (NOTE) Performed At: [facility] [address] [place], [place] [id number] Jolene Schimke MD [id number]    Free T4 07/31/2022 0.60 (L)  0.61 - 1.12 ng/dL Final    Comment: (NOTE) Biotin ingestion may interfere with free T4 tests. If the results are inconsistent with the TSH level, previous test results, or the clinical presentation, then consider biotin interference. If needed, order repeat testing after stopping biotin. Performed at [facility], [address] [place], [place] [address]    Glucose-Capillary 08/29/2022 100 (H)  70 - 99 mg/dL Final   Glucose reference range applies only to samples taken after fasting for at least 8 hours.   TSH 09/05/2022 0.793  0.350 - 4.500 uIU/mL Final   Comment: Performed by a 3rd Generation assay with a functional sensitivity of <=0.01 uIU/mL. Performed at [facility], [address] [place], [place] [address]    Free T4 09/05/2022 0.73  0.61 - 1.12 ng/dL Final   Comment: (NOTE) Biotin ingestion may interfere with free T4 tests. If the results are inconsistent with the TSH level, previous test results, or the clinical presentation, then consider biotin interference. If needed, order repeat testing after stopping biotin. Performed at [facility], [address], [place], [place] [address]    Preg Test, Ur 09/06/2022 Negative  Negative Final   Preg Test, Ur 09/06/2022 NEGATIVE  NEGATIVE Final   Comment:        THE SENSITIVITY OF THIS METHODOLOGY IS >24 mIU/mL    Preg Test, Ur 09/05/2022 NEGATIVE  NEGATIVE Final   Comment:        THE SENSITIVITY OF THIS METHODOLOGY IS >24 mIU/mL    Sodium 09/13/2022 136  135 - 145 mmol/L Final   Potassium 09/13/2022 4.5  3.5 - 5.1 mmol/L Final   Chloride 09/13/2022 105  98 - 111 mmol/L Final   CO2 09/13/2022 21 (L)  22 - 32 mmol/L Final   Glucose, Bld 09/13/2022 111 (H)  70 - 99 mg/dL Final   Glucose reference range applies only to samples taken after fasting for at least 8 hours.   BUN 09/13/2022 14  6 - 20 mg/dL Final  Creatinine, Ser 09/13/2022 0.92  0.44 - 1.00 mg/dL Final   Calcium 09/13/2022 9.1  8.9 - 10.3 mg/dL Final   GFR, Estimated  09/13/2022 >60  >60 mL/min Final   Comment: (NOTE) Calculated using the CKD-EPI Creatinine Equation (2021)    Anion gap 09/13/2022 10  5 - 15 Final   Performed at [facility], [place] [address] [name], [place] [address]   Vitamin B-12 09/13/2022 585  180 - 914 pg/mL Final   Comment: (NOTE) This assay is not validated for testing neonatal or myeloproliferative syndrome specimens for Vitamin B12 levels. Performed at [facility], [place] [address], [place], [place] [address]    Color, Urine 09/14/2022 YELLOW  YELLOW Final   APPearance 09/14/2022 HAZY (A)  CLEAR Final   Specific Gravity, Urine 09/14/2022 1.025  1.005 - 1.030 Final   pH 09/14/2022 5.0  5.0 - 8.0 Final   Glucose, UA 09/14/2022 NEGATIVE  NEGATIVE mg/dL Final   Hgb urine dipstick 09/14/2022 NEGATIVE  NEGATIVE Final   Bilirubin Urine 09/14/2022 NEGATIVE  NEGATIVE Final   Ketones, ur 09/14/2022 NEGATIVE  NEGATIVE mg/dL Final   Protein, ur 09/14/2022 NEGATIVE  NEGATIVE mg/dL Final   Nitrite 09/14/2022 NEGATIVE  NEGATIVE Final   Leukocytes,Ua 09/14/2022 TRACE (A)  NEGATIVE Final   RBC / HPF 09/14/2022 0-5  0 - 5 RBC/hpf Final   WBC, UA 09/14/2022 0-5  0 - 5 WBC/hpf Final   Bacteria, UA 09/14/2022 RARE (A)  NONE SEEN Final   Squamous Epithelial / HPF 09/14/2022 0-5  0 - 5 Final   Mucus 09/14/2022 PRESENT   Final   Performed at [facility], [place] [address], [place], [place] [address]   Glucose-Capillary 09/16/2022 105 (H)  70 - 99 mg/dL Final   Glucose reference range applies only to samples taken after fasting for at least 8 hours.   SARS Coronavirus 2 by RT PCR 09/30/2022 NEGATIVE  NEGATIVE Final   Comment: (NOTE) SARS-CoV-2 target nucleic acids are NOT DETECTED.  The SARS-CoV-2 RNA is generally detectable in upper respiratory specimens during the acute phase of infection. The lowest concentration of SARS-CoV-2 viral copies this assay can detect is 138 copies/mL. A negative result does not preclude  SARS-Cov-2 infection and should not be used as the sole basis for treatment or other patient management decisions. A negative result may occur with  improper specimen collection/handling, submission of specimen other than nasopharyngeal swab, presence of viral mutation(s) within the areas targeted by this assay, and inadequate number of viral copies(<138 copies/mL). A negative result must be combined with clinical observations, patient history, and epidemiological information. The expected result is Negative.  Fact Sheet for Patients:  EntrepreneurPulse.com.au  Fact Sheet for Healthcare Providers:  IncredibleEmployment.be  This test is no                          t yet approved or cleared by the [place] FDA and  has been authorized for detection and/or diagnosis of SARS-CoV-2 by FDA under an Emergency Use Authorization (EUA). This EUA will remain  in effect (meaning this test can be used) for the duration of the COVID-19 declaration under Section 564(b)(1) of the Act, 21 U.S.C.section 360bbb-3(b)(1), unless the authorization is terminated  or revoked sooner.       Influenza A by PCR 09/30/2022 NEGATIVE  NEGATIVE Final   Influenza B by PCR 09/30/2022 NEGATIVE  NEGATIVE Final   Comment: (NOTE) The  Xpert Xpress SARS-CoV-2/FLU/RSV plus assay is intended as an aid in the diagnosis of influenza from Nasopharyngeal swab specimens and should not be used as a sole basis for treatment. Nasal washings and aspirates are unacceptable for Xpert Xpress SARS-CoV-2/FLU/RSV testing.  Fact Sheet for Patients: EntrepreneurPulse.com.au  Fact Sheet for Healthcare Providers: IncredibleEmployment.be  This test is not yet approved or cleared by the [place] FDA and has been authorized for detection and/or diagnosis of SARS-CoV-2 by FDA under an Emergency Use Authorization (EUA). This EUA will remain in effect  (meaning this test can be used) for the duration of the COVID-19 declaration under Section 564(b)(1) of the Act, 21 U.S.C. section 360bbb-3(b)(1), unless the authorization is terminated or revoked.     Resp Syncytial Virus by PCR 09/30/2022 NEGATIVE  NEGATIVE Final   Comment: (NOTE) Fact Sheet for Patients: EntrepreneurPulse.com.au  Fact Sheet for Healthcare Providers: IncredibleEmployment.be  This test is not yet approved or cleared by the [place] FDA and has been authorized for detection and/or diagnosis of SARS-CoV-2 by FDA under an Emergency Use Authorization (EUA). This EUA will remain in effect (meaning this test can be used) for the duration of the COVID-19 declaration under Section 564(b)(1) of the Act, 21 U.S.C. section 360bbb-3(b)(1), unless the authorization is terminated or revoked.  Performed at [facility], [place] [address], [place], [place] [address]    Sodium 10/01/2022 136  135 - 145 mmol/L Final   Potassium 10/01/2022 3.8  3.5 - 5.1 mmol/L Final   Chloride 10/01/2022 104  98 - 111 mmol/L Final   CO2 10/01/2022 27  22 - 32 mmol/L Final   Glucose, Bld 10/01/2022 98  70 - 99 mg/dL Final   Glucose reference range applies only to samples taken after fasting for at least 8 hours.   BUN 10/01/2022 12  6 - 20 mg/dL Final   Creatinine, Ser 10/01/2022 0.98  0.44 - 1.00 mg/dL Final   Calcium 10/01/2022 8.9  8.9 - 10.3 mg/dL Final   GFR, Estimated 10/01/2022 >60  >60 mL/min Final   Comment: (NOTE) Calculated using the CKD-EPI Creatinine Equation (2021)    Anion gap 10/01/2022 5  5 - 15 Final   Performed at [facility], [place] [address], [place], [place] [address]   WBC 10/01/2022 5.1  4.0 - 10.5 K/uL Final   RBC 10/01/2022 4.31  3.87 - 5.11 MIL/uL Final   Hemoglobin 10/01/2022 12.7  12.0 - 15.0 g/dL Final   HCT 10/01/2022 38.8  36.0 - 46.0 % Final   MCV 10/01/2022 90.0  80.0 - 100.0 fL Final   MCH 10/01/2022 29.5   26.0 - 34.0 pg Final   MCHC 10/01/2022 32.7  30.0 - 36.0 g/dL Final   RDW 10/01/2022 12.4  11.5 - 15.5 % Final   Platelets 10/01/2022 300  150 - 400 K/uL Final   nRBC 10/01/2022 0.0  0.0 - 0.2 % Final   Performed at [place] [address], [place], [place] [address]   Lipase 10/01/2022 29  11 - 51 U/L Final   Performed at [place] [address], [place], [place] [address]   Color, Urine 10/05/2022 YELLOW  YELLOW Final   APPearance 10/05/2022 HAZY (A)  CLEAR Final   Specific Gravity, Urine 10/05/2022 1.018  1.005 - 1.030 Final   pH 10/05/2022 5.0  5.0 - 8.0 Final   Glucose, UA 10/05/2022 NEGATIVE  NEGATIVE mg/dL Final   Hgb urine dipstick 10/05/2022 NEGATIVE  NEGATIVE Final   Bilirubin Urine 10/05/2022 NEGATIVE  NEGATIVE Final   Ketones, ur 10/05/2022 NEGATIVE  NEGATIVE mg/dL Final   Protein, ur 10/05/2022 NEGATIVE  NEGATIVE mg/dL Final   Nitrite 10/05/2022 NEGATIVE  NEGATIVE Final   Leukocytes,Ua 10/05/2022 TRACE (A)  NEGATIVE Final   RBC / HPF 10/05/2022 0-5  0 - 5 RBC/hpf Final   WBC, UA 10/05/2022 0-5  0 - 5 WBC/hpf Final   Bacteria, UA 10/05/2022 RARE (A)  NONE SEEN Final   Squamous Epithelial / HPF 10/05/2022 0-5  0 - 5 Final   Mucus 10/05/2022 PRESENT   Final   Performed at [facility], [place] [address], [place], [place] [address]   Specimen Description 10/05/2022 URINE, CLEAN CATCH   Final   Special Requests 10/05/2022    Final                   Value:NONE Performed at [facility], [place] [address], [place], [place] [address]    Culture 10/05/2022 10,000 COLONIES/mL MULTIPLE SPECIES PRESENT, SUGGEST RECOLLECTION (A)   Final   Report Status 10/05/2022 10/07/2022 FINAL   Final  Admission on 07/04/2022, Discharged on 07/05/2022  Component Date Value Ref Range Status   Sodium 07/04/2022 142  135 - 145 mmol/L Final   Potassium 07/04/2022 4.4  3.5 - 5.1 mmol/L Final   Chloride 07/04/2022 109  98 - 111 mmol/L Final   CO2 07/04/2022 26  22 - 32  mmol/L Final   Glucose, Bld 07/04/2022 96  70 - 99 mg/dL Final   Glucose reference range applies only to samples taken after fasting for at least 8 hours.   BUN 07/04/2022 15  6 - 20 mg/dL Final   Creatinine, Ser 07/04/2022 0.83  0.44 - 1.00 mg/dL Final   Calcium 07/04/2022 9.3  8.9 - 10.3 mg/dL Final   Total Protein 07/04/2022 7.2  6.5 - 8.1 g/dL Final   Albumin 07/04/2022 3.9  3.5 - 5.0 g/dL Final   AST 07/04/2022 23  15 - 41 U/L Final   ALT 07/04/2022 28  0 - 44 U/L Final   Alkaline Phosphatase 07/04/2022 77  38 - 126 U/L Final   Total Bilirubin 07/04/2022 0.4  0.3 - 1.2 mg/dL Final   GFR, Estimated 07/04/2022 >60  >60 mL/min Final   Comment: (NOTE) Calculated using the CKD-EPI Creatinine Equation (2021)    Anion gap 07/04/2022 7  5 - 15 Final   Performed at [facility], [place] [address] [place], [place] [address]   Alcohol, Ethyl (B) 07/04/2022 <10  <10 mg/dL Final   Comment: (NOTE) Lowest detectable limit for serum alcohol is 10 mg/dL.  For medical purposes only. Performed at [facility], [place] [address] [place], [place] [address]    WBC 07/04/2022 7.0  4.0 - 10.5 K/uL Final   RBC 07/04/2022 4.18  3.87 - 5.11 MIL/uL Final   Hemoglobin 07/04/2022 12.8  12.0 - 15.0 g/dL Final   HCT 07/04/2022 39.1  36.0 - 46.0 % Final   MCV 07/04/2022 93.5  80.0 - 100.0 fL Final   MCH 07/04/2022 30.6  26.0 - 34.0 pg Final   MCHC 07/04/2022 32.7  30.0 - 36.0 g/dL Final   RDW 07/04/2022 12.9  11.5 - 15.5 % Final   Platelets 07/04/2022 243  150 - 400 K/uL Final   nRBC 07/04/2022 0.0  0.0 - 0.2 % Final   Neutrophils Relative % 07/04/2022 43  % Final  Neutro Abs 07/04/2022 3.0  1.7 - 7.7 K/uL Final   Lymphocytes Relative 07/04/2022 50  % Final   Lymphs Abs 07/04/2022 3.5  0.7 - 4.0 K/uL Final   Monocytes Relative 07/04/2022 7  % Final   Monocytes Absolute 07/04/2022 0.5  0.1 - 1.0 K/uL Final   Eosinophils Relative 07/04/2022 0  % Final   Eosinophils  Absolute 07/04/2022 0.0  0.0 - 0.5 K/uL Final   Basophils Relative 07/04/2022 0  % Final   Basophils Absolute 07/04/2022 0.0  0.0 - 0.1 K/uL Final   Immature Granulocytes 07/04/2022 0  % Final   Abs Immature Granulocytes 07/04/2022 0.01  0.00 - 0.07 K/uL Final   Performed at [facility], [place] [address], [place], [place] [address]   I-stat hCG, quantitative 07/04/2022 <5.0  <5 mIU/mL Final   Comment 3 07/04/2022          Final   Comment:   GEST. AGE      CONC.  (mIU/mL)   <=1 WEEK        5 - 50     2 WEEKS       50 - 500     3 WEEKS       100 - 10,000     4 WEEKS     1,000 - 30,000        FEMALE AND NON-PREGNANT FEMALE:     LESS THAN 5 mIU/mL   Admission on 06/14/2022, Discharged on 06/14/2022  Component Date Value Ref Range Status   Color, UA 06/14/2022 yellow  yellow Final   Clarity, UA 06/14/2022 cloudy (A)  clear Final   Glucose, UA 06/14/2022 negative  negative mg/dL Final   Bilirubin, UA 06/14/2022 negative  negative Final   Ketones, POC UA 06/14/2022 negative  negative mg/dL Final   Spec Grav, UA 06/14/2022 1.025  1.010 - 1.025 Final   Blood, UA 06/14/2022 negative  negative Final   pH, UA 06/14/2022 7.5  5.0 - 8.0 Final   Protein Ur, POC 06/14/2022 =30 (A)  negative mg/dL Final   Urobilinogen, UA 06/14/2022 0.2  0.2 or 1.0 E.U./dL Final   Nitrite, UA 06/14/2022 Negative  Negative Final   Leukocytes, UA 06/14/2022 Negative  Negative Final   Preg Test, Ur 06/14/2022 Negative  Negative Final   Specimen Description 06/14/2022 URINE, CLEAN CATCH   Final   Special Requests 06/14/2022    Final                   Value:NONE Performed at [facility], [place] [address] [place], [place] [address]    Culture 06/14/2022 MULTIPLE SPECIES PRESENT, SUGGEST RECOLLECTION (A)   Final   Report Status 06/14/2022 06/16/2022 FINAL   Final  Admission on 06/07/2022, Discharged on 06/10/2022  Component Date Value Ref Range Status   SARS Coronavirus 2 by RT PCR 06/07/2022  NEGATIVE  NEGATIVE Final   Comment: (NOTE) SARS-CoV-2 target nucleic acids are NOT DETECTED.  The SARS-CoV-2 RNA is generally detectable in upper respiratory specimens during the acute phase of infection. The lowest concentration of SARS-CoV-2 viral copies this assay can detect is 138 copies/mL. A negative result does not preclude SARS-Cov-2 infection and should not be used as the sole basis for treatment or other patient management decisions. A negative result may occur with  improper specimen collection/handling, submission of specimen other than nasopharyngeal swab, presence of viral mutation(s) within the areas targeted by this assay, and inadequate number of viral  copies(<138 copies/mL). A negative result must be combined with clinical observations, patient history, and epidemiological information. The expected result is Negative.  Fact Sheet for Patients:  EntrepreneurPulse.com.au  Fact Sheet for Healthcare Providers:  IncredibleEmployment.be  This test is no                          t yet approved or cleared by the [place] FDA and  has been authorized for detection and/or diagnosis of SARS-CoV-2 by FDA under an Emergency Use Authorization (EUA). This EUA will remain  in effect (meaning this test can be used) for the duration of the COVID-19 declaration under Section 564(b)(1) of the Act, 21 U.S.C.section 360bbb-3(b)(1), unless the authorization is terminated  or revoked sooner.       Influenza A by PCR 06/07/2022 NEGATIVE  NEGATIVE Final   Influenza B by PCR 06/07/2022 NEGATIVE  NEGATIVE Final   Comment: (NOTE) The Xpert Xpress SARS-CoV-2/FLU/RSV plus assay is intended as an aid in the diagnosis of influenza from Nasopharyngeal swab specimens and should not be used as a sole basis for treatment. Nasal washings and aspirates are unacceptable for Xpert Xpress SARS-CoV-2/FLU/RSV testing.  Fact Sheet for  Patients: EntrepreneurPulse.com.au  Fact Sheet for Healthcare Providers: IncredibleEmployment.be  This test is not yet approved or cleared by the [place] FDA and has been authorized for detection and/or diagnosis of SARS-CoV-2 by FDA under an Emergency Use Authorization (EUA). This EUA will remain in effect (meaning this test can be used) for the duration of the COVID-19 declaration under Section 564(b)(1) of the Act, 21 U.S.C. section 360bbb-3(b)(1), unless the authorization is terminated or revoked.  Performed at [facility], [place] [address], [place], [place] [address]    WBC 06/07/2022 5.7  4.0 - 10.5 K/uL Final   RBC 06/07/2022 4.39  3.87 - 5.11 MIL/uL Final   Hemoglobin 06/07/2022 13.2  12.0 - 15.0 g/dL Final   HCT 06/07/2022 40.4  36.0 - 46.0 % Final   MCV 06/07/2022 92.0  80.0 - 100.0 fL Final   MCH 06/07/2022 30.1  26.0 - 34.0 pg Final   MCHC 06/07/2022 32.7  30.0 - 36.0 g/dL Final   RDW 06/07/2022 12.4  11.5 - 15.5 % Final   Platelets 06/07/2022 308  150 - 400 K/uL Final   nRBC 06/07/2022 0.0  0.0 - 0.2 % Final   Neutrophils Relative % 06/07/2022 42  % Final   Neutro Abs 06/07/2022 2.4  1.7 - 7.7 K/uL Final   Lymphocytes Relative 06/07/2022 54  % Final   Lymphs Abs 06/07/2022 3.1  0.7 - 4.0 K/uL Final   Monocytes Relative 06/07/2022 4  % Final   Monocytes Absolute 06/07/2022 0.2  0.1 - 1.0 K/uL Final   Eosinophils Relative 06/07/2022 0  % Final   Eosinophils Absolute 06/07/2022 0.0  0.0 - 0.5 K/uL Final   Basophils Relative 06/07/2022 0  % Final   Basophils Absolute 06/07/2022 0.0  0.0 - 0.1 K/uL Final   Immature Granulocytes 06/07/2022 0  % Final   Abs Immature Granulocytes 06/07/2022 0.01  0.00 - 0.07 K/uL Final   Performed at [facility], [place] [address] [place], [place] [address]   Sodium 06/07/2022 139  135 - 145 mmol/L Final   Potassium 06/07/2022 4.0  3.5 - 5.1 mmol/L Final   Chloride 06/07/2022 104  98 -  111 mmol/L Final   CO2 06/07/2022 28  22 - 32 mmol/L Final  Glucose, Bld 06/07/2022 104 (H)  70 - 99 mg/dL Final   Glucose reference range applies only to samples taken after fasting for at least 8 hours.   BUN 06/07/2022 8  6 - 20 mg/dL Final   Creatinine, Ser 06/07/2022 0.84  0.44 - 1.00 mg/dL Final   Calcium 06/07/2022 9.1  8.9 - 10.3 mg/dL Final   Total Protein 06/07/2022 6.9  6.5 - 8.1 g/dL Final   Albumin 06/07/2022 3.7  3.5 - 5.0 g/dL Final   AST 06/07/2022 19  15 - 41 U/L Final   ALT 06/07/2022 22  0 - 44 U/L Final   Alkaline Phosphatase 06/07/2022 54  38 - 126 U/L Final   Total Bilirubin 06/07/2022 0.4  0.3 - 1.2 mg/dL Final   GFR, Estimated 06/07/2022 >60  >60 mL/min Final   Comment: (NOTE) Calculated using the CKD-EPI Creatinine Equation (2021)    Anion gap 06/07/2022 7  5 - 15 Final   Performed at [place] [address] [place], [place] [address]   Hgb A1c MFr Bld 06/07/2022 5.0  4.8 - 5.6 % Final   Comment: (NOTE) Pre diabetes:          5.7%-6.4%  Diabetes:              >6.4%  Glycemic control for   <7.0% adults with diabetes    Mean Plasma Glucose 06/07/2022 96.8  mg/dL Final   Performed at [facility], [place] [address], [place], [place] [address]   TSH 06/07/2022 1.620  0.350 - 4.500 uIU/mL Final   Comment: Performed by a 3rd Generation assay with a functional sensitivity of <=0.01 uIU/mL. Performed at [facility], [place] [address] [place], [place] [address]    RPR Ser Ql 06/07/2022 NON REACTIVE  NON REACTIVE Final   Performed at [facility], [place] [address], [place], [place] [address]   Color, Urine 06/07/2022 YELLOW  YELLOW Final   APPearance 06/07/2022 HAZY (A)  CLEAR Final   Specific Gravity, Urine 06/07/2022 1.018  1.005 - 1.030 Final   pH 06/07/2022 7.0  5.0 - 8.0 Final   Glucose, UA 06/07/2022 NEGATIVE  NEGATIVE mg/dL Final   Hgb urine dipstick 06/07/2022 NEGATIVE  NEGATIVE Final   Bilirubin Urine 06/07/2022 NEGATIVE   NEGATIVE Final   Ketones, ur 06/07/2022 NEGATIVE  NEGATIVE mg/dL Final   Protein, ur 06/07/2022 NEGATIVE  NEGATIVE mg/dL Final   Nitrite 06/07/2022 NEGATIVE  NEGATIVE Final   Leukocytes,Ua 06/07/2022 NEGATIVE  NEGATIVE Final   Performed at [facility], [place] [address] [place], [place] [address]   Cholesterol 06/07/2022 164  0 - 200 mg/dL Final   Triglycerides 06/07/2022 158 (H)  <150 mg/dL Final   HDL 06/07/2022 44  >40 mg/dL Final   Total CHOL/HDL Ratio 06/07/2022 3.7  RATIO Final   VLDL 06/07/2022 32  0 - 40 mg/dL Final   LDL Cholesterol 06/07/2022 88  0 - 99 mg/dL Final   Comment:        Total Cholesterol/HDL:CHD Risk Coronary Heart Disease Risk Table                     Men   Women  1/2 Average Risk   3.4   3.3  Average Risk       5.0   4.4  2 X Average Risk   9.6   7.1  3 X Average Risk  23.4   11.0  Use the calculated Patient Ratio above and the CHD Risk Table to determine the patient's CHD Risk.        ATP III CLASSIFICATION (LDL):  <100     mg/dL   Optimal  100-129  mg/dL   Near or Above                    Optimal  130-159  mg/dL   Borderline  160-189  mg/dL   High  >190     mg/dL   Very High Performed at [place] [address] [place], [place] [address]    HIV Screen 4th Generation wRfx 06/07/2022 Non Reactive  Non Reactive Final   Performed at [facility], [place] [address] [place], [place] [address]   SARSCOV2ONAVIRUS 2 AG 06/07/2022 NEGATIVE  NEGATIVE Final   Comment: (NOTE) SARS-CoV-2 antigen NOT DETECTED.   Negative results are presumptive.  Negative results do not preclude SARS-CoV-2 infection and should not be used as the sole basis for treatment or other patient management decisions, including infection  control decisions, particularly in the presence of clinical signs and  symptoms consistent with COVID-19, or in those who have been in contact with the virus.  Negative results must be combined with clinical observations,  patient history, and epidemiological information. The expected result is Negative.  Fact Sheet for Patients: HandmadeRecipes.com.cy  Fact Sheet for Healthcare Providers: FuneralLife.at  This test is not yet approved or cleared by the [place] FDA and  has been authorized for detection and/or diagnosis of SARS-CoV-2 by FDA under an Emergency Use Authorization (EUA).  This EUA will remain in effect (meaning this test can be used) for the duration of  the COV                          ID-19 declaration under Section 564(b)(1) of the Act, 21 U.S.C. section 360bbb-3(b)(1), unless the authorization is terminated or revoked sooner.     POC Amphetamine UR 06/07/2022 None Detected  NONE DETECTED (Cut Off Level 1000 ng/mL) Final   POC Secobarbital (BAR) 06/07/2022 None Detected  NONE DETECTED (Cut Off Level 300 ng/mL) Final   POC Buprenorphine (BUP) 06/07/2022 None Detected  NONE DETECTED (Cut Off Level 10 ng/mL) Final   POC Oxazepam (BZO) 06/07/2022 None Detected  NONE DETECTED (Cut Off Level 300 ng/mL) Final   POC Cocaine UR 06/07/2022 None Detected  NONE DETECTED (Cut Off Level 300 ng/mL) Final   POC Methamphetamine UR 06/07/2022 None Detected  NONE DETECTED (Cut Off Level 1000 ng/mL) Final   POC Morphine 06/07/2022 None Detected  NONE DETECTED (Cut Off Level 300 ng/mL) Final   POC Methadone UR 06/07/2022 None Detected  NONE DETECTED (Cut Off Level 300 ng/mL) Final   POC Oxycodone UR 06/07/2022 None Detected  NONE DETECTED (Cut Off Level 100 ng/mL) Final   POC Marijuana UR 06/07/2022 None Detected  NONE DETECTED (Cut Off Level 50 ng/mL) Final   Preg Test, Ur 06/08/2022 NEGATIVE  NEGATIVE Final   Comment:        THE SENSITIVITY OF THIS METHODOLOGY IS >20 mIU/mL. Performed at [facility], [place] [address], [place], [place] [address]    Preg Test, Ur 06/08/2022 NEGATIVE  NEGATIVE Final   Comment:        THE SENSITIVITY OF  THIS METHODOLOGY IS >24 mIU/mL     Blood Alcohol level:  Lab Results  Component Value Date  ETH <10 07/04/2022   ETH <10 [id number]    Metabolic Disorder Labs: Lab Results  Component Value Date   HGBA1C 5.7 (H) 10/15/2022   MPG 117 10/15/2022   MPG 96.8 06/07/2022   No results found for: "PROLACTIN" Lab Results  Component Value Date   CHOL 181 07/25/2022   TRIG 118 07/25/2022   HDL 45 07/25/2022   CHOLHDL 4.0 07/25/2022   VLDL 24 07/25/2022   LDLCALC 112 (H) 07/25/2022   LDLCALC 88 06/07/2022    Therapeutic Lab Levels: No results found for: "LITHIUM" No results found for: "VALPROATE" No results found for: "CBMZ"  Physical Findings   GAD-7    [place] Office Visit from 12/17/2021 in [facility] [place]  Total GAD-7 Score 17      PHQ2-9    [place] ED from 06/07/2022 in [facility] Office Visit from 12/17/2021 in [place]  PHQ-2 Total Score 1 2  PHQ-9 Total Score 2 8      Flowsheet Row ED from 10/07/2022 in [facility] Most recent reading at 10/07/2022  8:16 PM ED from 10/07/2022 in [place] Most recent reading at 10/07/2022 12:18 PM ED from 07/25/2022 in [facility] Most recent reading at 10/06/2022  9:38 AM  C-SSRS RISK CATEGORY No Risk No Risk No Risk        Musculoskeletal  Strength & Muscle Tone: within normal limits Gait & Station: normal Patient leans: N/A  Psychiatric Specialty Exam  Presentation  General Appearance:  Appropriate for Environment  Eye Contact: Good  Speech: Clear and Coherent; Normal Rate  Speech Volume: Normal  Handedness: Right   Mood and Affect  Mood: Euthymic  Affect: Congruent   Thought Process  Thought Processes: Coherent  Descriptions of Associations:Intact  Orientation:Full (Time, Place and Person)  Thought  Content:Logical  Diagnosis of Schizophrenia or Schizoaffective disorder in past: No    Hallucinations:Hallucinations: None  Ideas of Reference:None  Suicidal Thoughts:Suicidal Thoughts: No  Homicidal Thoughts:Homicidal Thoughts: No   Sensorium  Memory: Immediate Good; Recent Good; Remote Good  Judgment: Good  Insight: Good   Executive Functions  Concentration: Good  Attention Span: Good  Recall: Good  Fund of Knowledge: Good  Language: Good   Psychomotor Activity  Psychomotor Activity: Psychomotor Activity: Normal   Assets  Assets: Communication Skills; Desire for Improvement; Financial Resources/Insurance; Leisure Time; Physical Health; Resilience; Social Support   Sleep  Sleep: Sleep: Good   No data recorded  Physical Exam  Physical Exam Vitals and nursing note reviewed.  Constitutional:      General: She is not in acute distress.    Appearance: Normal appearance. She is well-developed. She is not ill-appearing.  HENT:     Head: Normocephalic.  Eyes:     General:        Right eye: No discharge.        Left eye: No discharge.     Conjunctiva/sclera: Conjunctivae normal.  Cardiovascular:     Rate and Rhythm: Normal rate.     Heart sounds: No murmur heard. Pulmonary:     Effort: Pulmonary effort is normal. No respiratory distress.  Musculoskeletal:        General: No swelling. Normal range of motion.     Cervical back: Normal range of motion.  Skin:    General: Skin is warm.  Neurological:     Mental Status: She is  alert and oriented to person, place, and time.  Psychiatric:        Attention and Perception: Attention and perception normal.        Mood and Affect: Mood and affect normal.        Speech: Speech normal.        Behavior: Behavior normal. Behavior is cooperative.        Thought Content: Thought content normal.        Cognition and Memory: Cognition normal.        Judgment: Judgment normal.    Review of Systems   Constitutional: Negative.   HENT: Negative.    Eyes: Negative.   Respiratory: Negative.    Cardiovascular: Negative.   Musculoskeletal: Negative.   Skin: Negative.   Neurological: Negative.   Psychiatric/Behavioral: Negative.     Blood pressure 117/67, pulse 96, temperature 98.3 F (36.8 C), temperature source Oral, resp. rate 16, height 5\' 1"  (1.549 m), weight 198 lb (89.8 kg), SpO2 96 %. Body mass index is 37.41 kg/m.  Treatment Plan Summary:  Boarder. Patient remains psychiatrically stable and ready for dc. Barrier to [employer] is safe housing. SW/DSS continue to seek placement.    Revonda Humphrey, NP 10/19/2022 2:01 PM

## 2022-10-19 NOTE — ED Notes (Signed)
Pt is in the bed sleeping. Respirations are even and unlabored. No acute distress noted. Will continue to monitor for safety. 

## 2022-10-20 DIAGNOSIS — E039 Hypothyroidism, unspecified: Secondary | ICD-10-CM | POA: Diagnosis not present

## 2022-10-20 DIAGNOSIS — N390 Urinary tract infection, site not specified: Secondary | ICD-10-CM | POA: Diagnosis not present

## 2022-10-20 DIAGNOSIS — Z1152 Encounter for screening for COVID-19: Secondary | ICD-10-CM | POA: Diagnosis not present

## 2022-10-20 DIAGNOSIS — F419 Anxiety disorder, unspecified: Secondary | ICD-10-CM | POA: Diagnosis not present

## 2022-10-20 DIAGNOSIS — N9489 Other specified conditions associated with female genital organs and menstrual cycle: Secondary | ICD-10-CM | POA: Diagnosis not present

## 2022-10-20 DIAGNOSIS — F209 Schizophrenia, unspecified: Secondary | ICD-10-CM | POA: Diagnosis not present

## 2022-10-20 DIAGNOSIS — F333 Major depressive disorder, recurrent, severe with psychotic symptoms: Secondary | ICD-10-CM | POA: Diagnosis not present

## 2022-10-20 DIAGNOSIS — F17200 Nicotine dependence, unspecified, uncomplicated: Secondary | ICD-10-CM | POA: Diagnosis not present

## 2022-10-20 DIAGNOSIS — E118 Type 2 diabetes mellitus with unspecified complications: Secondary | ICD-10-CM | POA: Diagnosis not present

## 2022-10-20 NOTE — ED Notes (Signed)
Patient is watching tv. Alert and oriented. Will continue to monitor for safety. 

## 2022-10-20 NOTE — ED Notes (Signed)
Pt A&O x 4, no distress noted. Calm & cooperative, Monitoring for safety.

## 2022-10-20 NOTE — ED Notes (Signed)
Patient resting with no sxs of distress noted - will continue to monitor for safety 

## 2022-10-20 NOTE — ED Notes (Signed)
Patient pulled the staff notification button in the bathroom. Patient was found sitting on the floor. She denies falling. She said she just "sat down."  Patient is tearful and complaining of anxiety because she has "not been able to talk to her daughter."  She is complaining of some "chest pain."  Provider made aware, Vitals obtained and WNL.  Prn anxiety med administered. Patient assisted back to bed. Will continue to monitor

## 2022-10-20 NOTE — ED Notes (Signed)
Patient  sleeping in no acute stress. RR even and unlabored .Environment secured .Will continue to monitor for safely. 

## 2022-10-20 NOTE — ED Provider Notes (Signed)
Behavioral Health Progress Note  Date and Time: 10/20/2022 3:47 PM Name: Nicole [name] MRN:  [id number]   HPI Nicole [name] 32 y.o., female patient with schizophrenia, MDD with psychosis,  presented to [facility], voluntarily, as a walk-in accompanied by [employer] after a disagreement and verbal altercation with personnel at the group home.  She has been dismissed previously from other group homes due to threatening behaviors, and suicidal ideation and the one which she was residing in.  Patient was admitted to continuous assessment unit on 07/25/2022 as she also endorsed passive SI at that time along with symptoms of visual hallucination. Patient was subsequently psychiatrically cleared during her admission to the continuous assessment unit.  However patient remains here at [facility] bordering until adequate- residential placement can be obtained.     Legal Guardian: [name]   Tests administered: Wechsler Adult Intelligence Scale-4 (see Jolene Provost, PhD consult note on 08/19/2022 for full info) [place]-3 Adaptive Behavior Scales Assessment completed on 09/13/2022 administered by [name], MD   Nicole [name], 32 y.o., female patient seen face to face by this provider and chart reviewed on 10/20/22.    Subjective:    On today's evaluation Nicole [name] is observed talking to another patient.  She is pleasant upon approach.  She is alert/oriented x 4, cooperative, and attentive.  She has normal speech and behavior.  She continues to deny SI/HI/AVH.  She denies anxiety and depression.  She asked for chocolate.  Reports her ears are feeling much better and the pain has subsided.  She is denying any concerns with appetite or sleep.  Continues to remain cooperative with staff and compliant with medications.  Patient continues to remain psychiatrically cleared. Social work/DSS seeking placement    Diagnosis:  Final  diagnoses:  Tobacco use disorder  Schizophrenia, unspecified type (HCC)  MDD (major depressive disorder), recurrent episode, moderate (HCC)  Hypothyroidism, unspecified type    Total Time spent with patient: 30 minutes  Past Psychiatric History: see h&P Past Medical History:  Past Medical History:  Diagnosis Date   Anxiety    Depression    Hypothyroidism 08/07/2022   Tobacco use disorder 08/07/2022    Past Surgical History:  Procedure Laterality Date   WISDOM TOOTH EXTRACTION Bilateral 2020   Family History:  Family History  Problem Relation Age of Onset   Hypertension Father    Diabetes Father    Family Psychiatric  History: See H&P Social History:  Social History   Substance and Sexual Activity  Alcohol Use Never     Social History   Substance and Sexual Activity  Drug Use Never    Social History   Socioeconomic History   Marital status: Single    Spouse name: Not on file   Number of children: Not on file   Years of education: Not on file   Highest education level: Not on file  Occupational History   Not on file  Tobacco Use   Smoking status: Every Day    Types: Cigarettes   Smokeless tobacco: Never  Vaping Use   Vaping Use: Some days  Substance and Sexual Activity   Alcohol use: Never   Drug use: Never   Sexual activity: Yes    Partners: Female    Birth control/protection: Implant  Other Topics Concern   Not on file  Social History Narrative   Not on file   Social Determinants of Health  Financial Resource Strain: Not on file  Food Insecurity: Not on file  Transportation Needs: Not on file  Physical Activity: Not on file  Stress: Not on file  Social Connections: Not on file   SDOH:  SDOH Screenings   Depression (PHQ2-9): Low Risk  (06/10/2022)  Tobacco Use: High Risk (10/16/2022)   Additional Social History:                         Sleep: Good  Appetite:  Good  Current Medications:  Current Facility-Administered  Medications  Medication Dose Route Frequency Provider Last Rate Last Admin   acetaminophen (TYLENOL) tablet 650 mg  650 mg Oral Q6H PRN Maple Hudson, Veronique M, NP       alum & mag hydroxide-simeth (MAALOX/MYLANTA) 200-200-20 MG/5ML suspension 30 mL  30 mL Oral Q4H PRN Maple Hudson, Veronique M, NP   30 mL at 10/18/22 1245   cetaphil lotion   Topical PRN Merrily Brittle, DO       fluticasone [facility] 50 MCG/ACT nasal spray 1 spray  1 spray Each Nare QHS Merrily Brittle, DO   1 spray at 10/18/22 2116   hydrOXYzine (ATARAX) tablet 25 mg  25 mg Oral TID PRN Christene Slates, MD   25 mg at 10/18/22 2116   levothyroxine (SYNTHROID) tablet 100 mcg  100 mcg Oral Once Byungura, Veronique M, NP       levothyroxine (SYNTHROID) tablet 100 mcg  100 mcg Oral Q0600 Haynes Kerns, NP   100 mcg at 10/20/22 8099   loratadine (CLARITIN) tablet 10 mg  10 mg Oral Daily Carrion-Carrero, Margely, MD   10 mg at 10/20/22 0901   magnesium hydroxide (MILK OF MAGNESIA) suspension 30 mL  30 mL Oral Daily PRN Haynes Kerns, NP   30 mL at 10/12/22 2145   metFORMIN (GLUCOPHAGE) tablet 500 mg  500 mg Oral Q breakfast Carrion-Carrero, Margely, MD   500 mg at 10/20/22 0901   nicotine (NICODERM CQ - dosed in mg/24 hours) patch 14 mg  14 mg Transdermal Daily PRN Carrion-Carrero, Caryl Ada, MD       ondansetron (ZOFRAN-ODT) disintegrating tablet 4 mg  4 mg Oral Q8H PRN Carrion-Carrero, Margely, MD   4 mg at 10/18/22 8338   Oxcarbazepine (TRILEPTAL) tablet 300 mg  300 mg Oral BID Byungura, Veronique M, NP   300 mg at 10/20/22 0901   pantoprazole (PROTONIX) EC tablet 40 mg  40 mg Oral Daily Carrion-Carrero, Margely, MD   40 mg at 10/20/22 0901   polyethylene glycol (MIRALAX / GLYCOLAX) packet 17 g  17 g Oral Daily Merrily Brittle, DO   17 g at 10/20/22 0901   QUEtiapine (SEROQUEL) tablet 400 mg  400 mg Oral BID Maple Hudson, Veronique M, NP   400 mg at 10/20/22 0901   sertraline (ZOLOFT) tablet 150 mg  150 mg Oral Daily  Carrion-Carrero, Margely, MD   150 mg at 10/20/22 0901   sodium chloride (OCEAN) 0.65 % nasal spray 1 spray  1 spray Each Nare Daily Merrily Brittle, DO   1 spray at 10/20/22 0902   traZODone (DESYREL) tablet 100 mg  100 mg Oral QHS Ronelle Nigh M, NP   100 mg at 10/19/22 2156   traZODone (DESYREL) tablet 50 mg  50 mg Oral QHS PRN Haynes Kerns, NP   50 mg at 10/18/22 2117   valACYclovir (VALTREX) tablet 500 mg  500 mg Oral Daily Christene Slates, MD   500 mg at 10/20/22 0901  Current Outpatient Medications  Medication Sig Dispense Refill   ABILIFY MAINTENA 400 MG PRSY prefilled syringe 400 mg every 28 (twenty-eight) days.     cetirizine (ZYRTEC) 10 MG tablet Take 10 mg by mouth daily.     cyclobenzaprine (FLEXERIL) 10 MG tablet Take 1 tablet (10 mg total) by mouth 2 (two) times daily as needed for muscle spasms. 20 tablet 0   fluticasone (FLONASE) 50 MCG/ACT nasal spray Place 1 spray into both nostrils daily.     meloxicam (MOBIC) 15 MG tablet Take 15 mg by mouth daily.     nitrofurantoin, macrocrystal-monohydrate, (MACROBID) 100 MG capsule Take 1 capsule (100 mg total) by mouth 2 (two) times daily. 10 capsule 0   ondansetron (ZOFRAN-ODT) 4 MG disintegrating tablet Take 1 tablet (4 mg total) by mouth every 8 (eight) hours as needed for nausea or vomiting. 20 tablet 0   Oxcarbazepine (TRILEPTAL) 300 MG tablet Take 1 tablet (300 mg total) by mouth 2 (two) times daily. 60 tablet 0   QUEtiapine (SEROQUEL) 400 MG tablet Take 1 tablet (400 mg total) by mouth 2 (two) times daily. 60 tablet 0   sertraline (ZOLOFT) 50 MG tablet Take 3 tablets (150 mg total) by mouth in the morning. 90 tablet 0   traZODone (DESYREL) 100 MG tablet Take 1 tablet (100 mg total) by mouth at bedtime. 30 tablet 0   valACYclovir (VALTREX) 500 MG tablet Take 500 mg by mouth daily.      Labs  Lab Results:  Admission on 10/07/2022  Component Date Value Ref Range Status   Hgb A1c MFr Bld 10/15/2022 5.7  (H)  4.8 - 5.6 % Final   Comment: (NOTE)         Prediabetes: 5.7 - 6.4         Diabetes: >6.4         Glycemic control for adults with diabetes: <7.0    Mean Plasma Glucose 10/15/2022 117  mg/dL Final   Comment: (NOTE) Performed At: [facility] [address] [place], [place] [id number] Jolene Schimke MD WU:[id number]   Admission on 10/07/2022, Discharged on 10/07/2022  Component Date Value Ref Range Status   Color, Urine 10/07/2022 YELLOW  YELLOW Final   APPearance 10/07/2022 HAZY (A)  CLEAR Final   Specific Gravity, Urine 10/07/2022 1.011  1.005 - 1.030 Final   pH 10/07/2022 7.0  5.0 - 8.0 Final   Glucose, UA 10/07/2022 NEGATIVE  NEGATIVE mg/dL Final   Hgb urine dipstick 10/07/2022 SMALL (A)  NEGATIVE Final   Bilirubin Urine 10/07/2022 NEGATIVE  NEGATIVE Final   Ketones, ur 10/07/2022 NEGATIVE  NEGATIVE mg/dL Final   Protein, ur 95/62/1308 NEGATIVE  NEGATIVE mg/dL Final   Nitrite 65/78/4696 NEGATIVE  NEGATIVE Final   Leukocytes,Ua 10/07/2022 LARGE (A)  NEGATIVE Final   RBC / HPF 10/07/2022 0-5  0 - 5 RBC/hpf Final   WBC, UA 10/07/2022 0-5  0 - 5 WBC/hpf Final   Bacteria, UA 10/07/2022 FEW (A)  NONE SEEN Final   Squamous Epithelial / HPF 10/07/2022 11-20  0 - 5 Final   Performed at [facility], [address] [place] [address], [place], [place] [address]   I-stat hCG, quantitative 10/07/2022 <5.0  <5 mIU/mL Final   Comment 3 10/07/2022          Final   Comment:   GEST. AGE      CONC.  (mIU/mL)   <=1 WEEK        5 - 50  2 WEEKS       50 - 500     3 WEEKS       100 - 10,000     4 WEEKS     1,000 - 30,000        FEMALE AND NON-PREGNANT FEMALE:     LESS THAN 5 mIU/mL    Sodium 10/07/2022 138  135 - 145 mmol/L Final   Potassium 10/07/2022 4.3  3.5 - 5.1 mmol/L Final   Chloride 10/07/2022 104  98 - 111 mmol/L Final   CO2 10/07/2022 28  22 - 32 mmol/L Final   Glucose, Bld 10/07/2022 90  70 - 99 mg/dL Final   Glucose reference range applies only to samples taken after fasting  for at least 8 hours.   BUN 10/07/2022 8  6 - 20 mg/dL Final   Creatinine, Ser 10/07/2022 0.96  0.44 - 1.00 mg/dL Final   Calcium 16/07/9603 9.1  8.9 - 10.3 mg/dL Final   GFR, Estimated 10/07/2022 >60  >60 mL/min Final   Comment: (NOTE) Calculated using the CKD-EPI Creatinine Equation (2021)    Anion gap 10/07/2022 6  5 - 15 Final   Performed at [facility], [address], [place], [place] [address]   WBC 10/07/2022 5.8  4.0 - 10.5 K/uL Final   RBC 10/07/2022 4.36  3.87 - 5.11 MIL/uL Final   Hemoglobin 10/07/2022 12.9  12.0 - 15.0 g/dL Final   HCT 11/91/4782 40.0  36.0 - 46.0 % Final   MCV 10/07/2022 91.7  80.0 - 100.0 fL Final   MCH 10/07/2022 29.6  26.0 - 34.0 pg Final   MCHC 10/07/2022 32.3  30.0 - 36.0 g/dL Final   RDW 95/62/1308 12.4  11.5 - 15.5 % Final   Platelets 10/07/2022 295  150 - 400 K/uL Final   nRBC 10/07/2022 0.0  0.0 - 0.2 % Final   Performed at [facility], [address], [place], [place] [address]   SARS Coronavirus 2 by RT PCR 10/07/2022 NEGATIVE  NEGATIVE Final   Comment: (NOTE) SARS-CoV-2 target nucleic acids are NOT DETECTED.  The SARS-CoV-2 RNA is generally detectable in upper respiratory specimens during the acute phase of infection. The lowest concentration of SARS-CoV-2 viral copies this assay can detect is 138 copies/mL. A negative result does not preclude SARS-Cov-2 infection and should not be used as the sole basis for treatment or other patient management decisions. A negative result may occur with  improper specimen collection/handling, submission of specimen other than nasopharyngeal swab, presence of viral mutation(s) within the areas targeted by this assay, and inadequate number of viral copies(<138 copies/mL). A negative result must be combined with clinical observations, patient history, and epidemiological information. The expected result is Negative.  Fact Sheet for Patients:   BloggerCourse.com  Fact Sheet for Healthcare Providers:  SeriousBroker.it  This test is no                          t yet approved or cleared by the [place] FDA and  has been authorized for detection and/or diagnosis of SARS-CoV-2 by FDA under an Emergency Use Authorization (EUA). This EUA will remain  in effect (meaning this test can be used) for the duration of the COVID-19 declaration under Section 564(b)(1) of the Act, 21 U.S.C.section 360bbb-3(b)(1), unless the authorization is terminated  or revoked sooner.       Influenza A by PCR 10/07/2022 NEGATIVE  NEGATIVE  Final   Influenza B by PCR 10/07/2022 NEGATIVE  NEGATIVE Final   Comment: (NOTE) The Xpert Xpress SARS-CoV-2/FLU/RSV plus assay is intended as an aid in the diagnosis of influenza from Nasopharyngeal swab specimens and should not be used as a sole basis for treatment. Nasal washings and aspirates are unacceptable for Xpert Xpress SARS-CoV-2/FLU/RSV testing.  Fact Sheet for Patients: BloggerCourse.com  Fact Sheet for Healthcare Providers: SeriousBroker.it  This test is not yet approved or cleared by the [place] FDA and has been authorized for detection and/or diagnosis of SARS-CoV-2 by FDA under an Emergency Use Authorization (EUA). This EUA will remain in effect (meaning this test can be used) for the duration of the COVID-19 declaration under Section 564(b)(1) of the Act, 21 U.S.C. section 360bbb-3(b)(1), unless the authorization is terminated or revoked.     Resp Syncytial Virus by PCR 10/07/2022 NEGATIVE  NEGATIVE Final   Comment: (NOTE) Fact Sheet for Patients: BloggerCourse.com  Fact Sheet for Healthcare Providers: SeriousBroker.it  This test is not yet approved or cleared by the [place] FDA and has been authorized for detection and/or  diagnosis of SARS-CoV-2 by FDA under an Emergency Use Authorization (EUA). This EUA will remain in effect (meaning this test can be used) for the duration of the COVID-19 declaration under Section 564(b)(1) of the Act, 21 U.S.C. section 360bbb-3(b)(1), unless the authorization is terminated or revoked.  Performed at [facility], [address], [place], [place] [address]    Specimen Description 10/07/2022 URINE, CLEAN CATCH   Final   Special Requests 10/07/2022    Final                   Value:NONE Performed at [facility], [address] [place], [place] [address]    Culture 10/07/2022 MULTIPLE SPECIES PRESENT, SUGGEST RECOLLECTION (A)   Final   Report Status 10/07/2022 10/08/2022 FINAL   Final  Admission on 07/25/2022, Discharged on 10/07/2022  Component Date Value Ref Range Status   SARS Coronavirus 2 by RT PCR 07/25/2022 NEGATIVE  NEGATIVE Final   Comment: (NOTE) SARS-CoV-2 target nucleic acids are NOT DETECTED.  The SARS-CoV-2 RNA is generally detectable in upper respiratory specimens during the acute phase of infection. The lowest concentration of SARS-CoV-2 viral copies this assay can detect is 138 copies/mL. A negative result does not preclude SARS-Cov-2 infection and should not be used as the sole basis for treatment or other patient management decisions. A negative result may occur with  improper specimen collection/handling, submission of specimen other than nasopharyngeal swab, presence of viral mutation(s) within the areas targeted by this assay, and inadequate number of viral copies(<138 copies/mL). A negative result must be combined with clinical observations, patient history, and epidemiological information. The expected result is Negative.  Fact Sheet for Patients:  BloggerCourse.com  Fact Sheet for Healthcare Providers:  SeriousBroker.it  This test is no                          t yet approved  or cleared by the [place] FDA and  has been authorized for detection and/or diagnosis of SARS-CoV-2 by FDA under an Emergency Use Authorization (EUA). This EUA will remain  in effect (meaning this test can be used) for the duration of the COVID-19 declaration under Section 564(b)(1) of the Act, 21 U.S.C.section 360bbb-3(b)(1), unless the authorization is terminated  or revoked sooner.       Influenza A by PCR 07/25/2022  NEGATIVE  NEGATIVE Final   Influenza B by PCR 07/25/2022 NEGATIVE  NEGATIVE Final   Comment: (NOTE) The Xpert Xpress SARS-CoV-2/FLU/RSV plus assay is intended as an aid in the diagnosis of influenza from Nasopharyngeal swab specimens and should not be used as a sole basis for treatment. Nasal washings and aspirates are unacceptable for Xpert Xpress SARS-CoV-2/FLU/RSV testing.  Fact Sheet for Patients: BloggerCourse.com  Fact Sheet for Healthcare Providers: SeriousBroker.it  This test is not yet approved or cleared by the [place] FDA and has been authorized for detection and/or diagnosis of SARS-CoV-2 by FDA under an Emergency Use Authorization (EUA). This EUA will remain in effect (meaning this test can be used) for the duration of the COVID-19 declaration under Section 564(b)(1) of the Act, 21 U.S.C. section 360bbb-3(b)(1), unless the authorization is terminated or revoked.  Performed at [facility], [address] [place], [place] [address]    WBC 07/25/2022 8.3  4.0 - 10.5 K/uL Final   RBC 07/25/2022 4.44  3.87 - 5.11 MIL/uL Final   Hemoglobin 07/25/2022 13.7  12.0 - 15.0 g/dL Final   HCT 60/45/4098 40.2  36.0 - 46.0 % Final   MCV 07/25/2022 90.5  80.0 - 100.0 fL Final   MCH 07/25/2022 30.9  26.0 - 34.0 pg Final   MCHC 07/25/2022 34.1  30.0 - 36.0 g/dL Final   RDW 11/91/4782 12.2  11.5 - 15.5 % Final   Platelets 07/25/2022 248  150 - 400 K/uL Final   nRBC 07/25/2022 0.0  0.0 - 0.2 %  Final   Neutrophils Relative % 07/25/2022 43  % Final   Neutro Abs 07/25/2022 3.6  1.7 - 7.7 K/uL Final   Lymphocytes Relative 07/25/2022 52  % Final   Lymphs Abs 07/25/2022 4.2 (H)  0.7 - 4.0 K/uL Final   Monocytes Relative 07/25/2022 5  % Final   Monocytes Absolute 07/25/2022 0.4  0.1 - 1.0 K/uL Final   Eosinophils Relative 07/25/2022 0  % Final   Eosinophils Absolute 07/25/2022 0.0  0.0 - 0.5 K/uL Final   Basophils Relative 07/25/2022 0  % Final   Basophils Absolute 07/25/2022 0.0  0.0 - 0.1 K/uL Final   Immature Granulocytes 07/25/2022 0  % Final   Abs Immature Granulocytes 07/25/2022 0.02  0.00 - 0.07 K/uL Final   Performed at [facility], [address], [place], [place] [address]   Sodium 07/25/2022 138  135 - 145 mmol/L Final   Potassium 07/25/2022 4.0  3.5 - 5.1 mmol/L Final   Chloride 07/25/2022 104  98 - 111 mmol/L Final   CO2 07/25/2022 29  22 - 32 mmol/L Final   Glucose, Bld 07/25/2022 83  70 - 99 mg/dL Final   Glucose reference range applies only to samples taken after fasting for at least 8 hours.   BUN 07/25/2022 11  6 - 20 mg/dL Final   Creatinine, Ser 07/25/2022 0.97  0.44 - 1.00 mg/dL Final   Calcium 30/86/5784 9.2  8.9 - 10.3 mg/dL Final   Total Protein 69/62/9528 7.0  6.5 - 8.1 g/dL Final   Albumin 41/32/4401 3.8  3.5 - 5.0 g/dL Final   AST 02/72/5366 18  15 - 41 U/L Final   ALT 07/25/2022 22  0 - 44 U/L Final   Alkaline Phosphatase 07/25/2022 64  38 - 126 U/L Final   Total Bilirubin 07/25/2022 0.2 (L)  0.3 - 1.2 mg/dL Final   GFR, Estimated 07/25/2022 >60  >60 mL/min Final  Comment: (NOTE) Calculated using the CKD-EPI Creatinine Equation (2021)    Anion gap 07/25/2022 5  5 - 15 Final   Performed at [facility], [address], [place], [place] [address]   POC Amphetamine UR 07/25/2022 None Detected  NONE DETECTED (Cut Off Level 1000 ng/mL) Preliminary   POC Secobarbital (BAR) 07/25/2022 None Detected  NONE DETECTED (Cut Off Level 300 ng/mL)  Preliminary   POC Buprenorphine (BUP) 07/25/2022 None Detected  NONE DETECTED (Cut Off Level 10 ng/mL) Preliminary   POC Oxazepam (BZO) 07/25/2022 None Detected  NONE DETECTED (Cut Off Level 300 ng/mL) Preliminary   POC Cocaine UR 07/25/2022 None Detected  NONE DETECTED (Cut Off Level 300 ng/mL) Preliminary   POC Methamphetamine UR 07/25/2022 None Detected  NONE DETECTED (Cut Off Level 1000 ng/mL) Preliminary   POC Morphine 07/25/2022 None Detected  NONE DETECTED (Cut Off Level 300 ng/mL) Preliminary   POC Methadone UR 07/25/2022 None Detected  NONE DETECTED (Cut Off Level 300 ng/mL) Preliminary   POC Oxycodone UR 07/25/2022 None Detected  NONE DETECTED (Cut Off Level 100 ng/mL) Preliminary   POC Marijuana UR 07/25/2022 None Detected  NONE DETECTED (Cut Off Level 50 ng/mL) Preliminary   SARSCOV2ONAVIRUS 2 AG 07/25/2022 NEGATIVE  NEGATIVE Final   Comment: (NOTE) SARS-CoV-2 antigen NOT DETECTED.   Negative results are presumptive.  Negative results do not preclude SARS-CoV-2 infection and should not be used as the sole basis for treatment or other patient management decisions, including infection  control decisions, particularly in the presence of clinical signs and  symptoms consistent with COVID-19, or in those who have been in contact with the virus.  Negative results must be combined with clinical observations, patient history, and epidemiological information. The expected result is Negative.  Fact Sheet for Patients: https://www.jennings-kim.com/  Fact Sheet for Healthcare Providers: https://alexander-rogers.biz/  This test is not yet approved or cleared by the [place] FDA and  has been authorized for detection and/or diagnosis of SARS-CoV-2 by FDA under an Emergency Use Authorization (EUA).  This EUA will remain in effect (meaning this test can be used) for the duration of  the COV                          ID-19 declaration under Section 564(b)(1) of  the Act, 21 U.S.C. section 360bbb-3(b)(1), unless the authorization is terminated or revoked sooner.     Cholesterol 07/25/2022 181  0 - 200 mg/dL Final   Triglycerides 17/79/3903 118  <150 mg/dL Final   HDL 00/92/3300 45  >40 mg/dL Final   Total CHOL/HDL Ratio 07/25/2022 4.0  RATIO Final   VLDL 07/25/2022 24  0 - 40 mg/dL Final   LDL Cholesterol 07/25/2022 112 (H)  0 - 99 mg/dL Final   Comment:        Total Cholesterol/HDL:CHD Risk Coronary Heart Disease Risk Table                     Men   Women  1/2 Average Risk   3.4   3.3  Average Risk       5.0   4.4  2 X Average Risk   9.6   7.1  3 X Average Risk  23.4   11.0        Use the calculated Patient Ratio above and the CHD Risk Table to determine the patient's CHD Risk.        ATP III CLASSIFICATION (LDL):  <  100     mg/dL   Optimal  [phone]  mg/dL   Near or Above                    Optimal  130-159  mg/dL   Borderline  [phone]  mg/dL   High  >578>190     mg/dL   Very High Performed at [facility], [address] [place], [place] [address]    TSH 07/25/2022 6.668 (H)  0.350 - 4.500 uIU/mL Final   Comment: Performed by a 3rd Generation assay with a functional sensitivity of <=0.01 uIU/mL. Performed at [facility], [address] [place], [place] [address]    Glucose-Capillary 07/26/2022 104 (H)  70 - 99 mg/dL Final   Glucose reference range applies only to samples taken after fasting for at least 8 hours.   T3, Free 07/31/2022 2.3  2.0 - 4.4 pg/mL Final   Comment: (NOTE) Performed At: [facility] [address] [place], [place] [id number] Jolene SchimkeNagendra Sanjai MD VO:[phone]    Free T4 07/31/2022 0.60 (L)  0.61 - 1.12 ng/dL Final   Comment: (NOTE) Biotin ingestion may interfere with free T4 tests. If the results are inconsistent with the TSH level, previous test results, or the clinical presentation, then consider biotin interference. If needed, order repeat testing after stopping biotin. Performed at  [facility], [address] [place], [place] [address]    Glucose-Capillary 08/29/2022 100 (H)  70 - 99 mg/dL Final   Glucose reference range applies only to samples taken after fasting for at least 8 hours.   TSH 09/05/2022 0.793  0.350 - 4.500 uIU/mL Final   Comment: Performed by a 3rd Generation assay with a functional sensitivity of <=0.01 uIU/mL. Performed at [facility], [address] [place], [place] [address]    Free T4 09/05/2022 0.73  0.61 - 1.12 ng/dL Final   Comment: (NOTE) Biotin ingestion may interfere with free T4 tests. If the results are inconsistent with the TSH level, previous test results, or the clinical presentation, then consider biotin interference. If needed, order repeat testing after stopping biotin. Performed at [facility], [address] [place], [place] [address]    Preg Test, Ur 09/06/2022 Negative  Negative Final   Preg Test, Ur 09/06/2022 NEGATIVE  NEGATIVE Final   Comment:        THE SENSITIVITY OF THIS METHODOLOGY IS >24 mIU/mL    Preg Test, Ur 09/05/2022 NEGATIVE  NEGATIVE Final   Comment:        THE SENSITIVITY OF THIS METHODOLOGY IS >24 mIU/mL    Sodium 09/13/2022 136  135 - 145 mmol/L Final   Potassium 09/13/2022 4.5  3.5 - 5.1 mmol/L Final   Chloride 09/13/2022 105  98 - 111 mmol/L Final   CO2 09/13/2022 21 (L)  22 - 32 mmol/L Final   Glucose, Bld 09/13/2022 111 (H)  70 - 99 mg/dL Final   Glucose reference range applies only to samples taken after fasting for at least 8 hours.   BUN 09/13/2022 14  6 - 20 mg/dL Final   Creatinine, Ser 09/13/2022 0.92  0.44 - 1.00 mg/dL Final   Calcium 95/18/841612/10/2021 9.1  8.9 - 10.3 mg/dL Final   GFR, Estimated 09/13/2022 >60  >60 mL/min Final   Comment: (NOTE) Calculated using the CKD-EPI Creatinine Equation (2021)    Anion gap 09/13/2022 10  5 - 15 Final   Performed at [facility], [address], [place], [place] [address]   Vitamin B-12 09/13/2022 585  180 - 914 pg/mL  Final   Comment: (NOTE) This assay is not validated for testing neonatal or myeloproliferative syndrome specimens for Vitamin B12 levels. Performed at [facility], [address], [place], [place] [address]    Color, Urine 09/14/2022 YELLOW  YELLOW Final   APPearance 09/14/2022 HAZY (A)  CLEAR Final   Specific Gravity, Urine 09/14/2022 1.025  1.005 - 1.030 Final   pH 09/14/2022 5.0  5.0 - 8.0 Final   Glucose, UA 09/14/2022 NEGATIVE  NEGATIVE mg/dL Final   Hgb urine dipstick 09/14/2022 NEGATIVE  NEGATIVE Final   Bilirubin Urine 09/14/2022 NEGATIVE  NEGATIVE Final   Ketones, ur 09/14/2022 NEGATIVE  NEGATIVE mg/dL Final   Protein, ur 09/81/1914 NEGATIVE  NEGATIVE mg/dL Final   Nitrite 78/29/5621 NEGATIVE  NEGATIVE Final   Leukocytes,Ua 09/14/2022 TRACE (A)  NEGATIVE Final   RBC / HPF 09/14/2022 0-5  0 - 5 RBC/hpf Final   WBC, UA 09/14/2022 0-5  0 - 5 WBC/hpf Final   Bacteria, UA 09/14/2022 RARE (A)  NONE SEEN Final   Squamous Epithelial / HPF 09/14/2022 0-5  0 - 5 Final   Mucus 09/14/2022 PRESENT   Final   Performed at [facility], [address], [place], [place] [address]   Glucose-Capillary 09/16/2022 105 (H)  70 - 99 mg/dL Final   Glucose reference range applies only to samples taken after fasting for at least 8 hours.   SARS Coronavirus 2 by RT PCR 09/30/2022 NEGATIVE  NEGATIVE Final   Comment: (NOTE) SARS-CoV-2 target nucleic acids are NOT DETECTED.  The SARS-CoV-2 RNA is generally detectable in upper respiratory specimens during the acute phase of infection. The lowest concentration of SARS-CoV-2 viral copies this assay can detect is 138 copies/mL. A negative result does not preclude SARS-Cov-2 infection and should not be used as the sole basis for treatment or other patient management decisions. A negative result may occur with  improper specimen collection/handling, submission of specimen other than nasopharyngeal swab, presence of viral mutation(s) within  the areas targeted by this assay, and inadequate number of viral copies(<138 copies/mL). A negative result must be combined with clinical observations, patient history, and epidemiological information. The expected result is Negative.  Fact Sheet for Patients:  BloggerCourse.com  Fact Sheet for Healthcare Providers:  SeriousBroker.it  This test is no                          t yet approved or cleared by the [place] FDA and  has been authorized for detection and/or diagnosis of SARS-CoV-2 by FDA under an Emergency Use Authorization (EUA). This EUA will remain  in effect (meaning this test can be used) for the duration of the COVID-19 declaration under Section 564(b)(1) of the Act, 21 U.S.C.section 360bbb-3(b)(1), unless the authorization is terminated  or revoked sooner.       Influenza A by PCR 09/30/2022 NEGATIVE  NEGATIVE Final   Influenza B by PCR 09/30/2022 NEGATIVE  NEGATIVE Final   Comment: (NOTE) The Xpert Xpress SARS-CoV-2/FLU/RSV plus assay is intended as an aid in the diagnosis of influenza from Nasopharyngeal swab specimens and should not be used as a sole basis for treatment. Nasal washings and aspirates are unacceptable for Xpert Xpress SARS-CoV-2/FLU/RSV testing.  Fact Sheet for Patients: BloggerCourse.com  Fact Sheet for Healthcare Providers: SeriousBroker.it  This test is not yet  approved or cleared by the [place] and has been authorized for detection and/or diagnosis of SARS-CoV-2 by FDA under an Emergency Use Authorization (EUA). This EUA will remain in effect (meaning this test can be used) for the duration of the COVID-19 declaration under Section 564(b)(1) of the Act, 21 U.S.C. section 360bbb-3(b)(1), unless the authorization is terminated or revoked.     Resp Syncytial Virus by PCR 09/30/2022 NEGATIVE  NEGATIVE Final   Comment:  (NOTE) Fact Sheet for Patients: BloggerCourse.com  Fact Sheet for Healthcare Providers: SeriousBroker.it  This test is not yet approved or cleared by the [place] FDA and has been authorized for detection and/or diagnosis of SARS-CoV-2 by FDA under an Emergency Use Authorization (EUA). This EUA will remain in effect (meaning this test can be used) for the duration of the COVID-19 declaration under Section 564(b)(1) of the Act, 21 U.S.C. section 360bbb-3(b)(1), unless the authorization is terminated or revoked.  Performed at [facility], [address], [place], [place] [address]    Sodium 10/01/2022 136  135 - 145 mmol/L Final   Potassium 10/01/2022 3.8  3.5 - 5.1 mmol/L Final   Chloride 10/01/2022 104  98 - 111 mmol/L Final   CO2 10/01/2022 27  22 - 32 mmol/L Final   Glucose, Bld 10/01/2022 98  70 - 99 mg/dL Final   Glucose reference range applies only to samples taken after fasting for at least 8 hours.   BUN 10/01/2022 12  6 - 20 mg/dL Final   Creatinine, Ser 10/01/2022 0.98  0.44 - 1.00 mg/dL Final   Calcium 61/44/3154 8.9  8.9 - 10.3 mg/dL Final   GFR, Estimated 10/01/2022 >60  >60 mL/min Final   Comment: (NOTE) Calculated using the CKD-EPI Creatinine Equation (2021)    Anion gap 10/01/2022 5  5 - 15 Final   Performed at [facility], [address] [place], [place] [address]   WBC 10/01/2022 5.1  4.0 - 10.5 K/uL Final   RBC 10/01/2022 4.31  3.87 - 5.11 MIL/uL Final   Hemoglobin 10/01/2022 12.7  12.0 - 15.0 g/dL Final   HCT 61/95/0932 38.8  36.0 - 46.0 % Final   MCV 10/01/2022 90.0  80.0 - 100.0 fL Final   MCH 10/01/2022 29.5  26.0 - 34.0 pg Final   MCHC 10/01/2022 32.7  30.0 - 36.0 g/dL Final   RDW 67/09/4579 12.4  11.5 - 15.5 % Final   Platelets 10/01/2022 300  150 - 400 K/uL Final   nRBC 10/01/2022 0.0  0.0 - 0.2 % Final   Performed at [facility], [address] [place], [place] [address]    Lipase 10/01/2022 29  11 - 51 U/L Final   Performed at [facility], [address] [place], [place] [address]   Color, Urine 10/05/2022 YELLOW  YELLOW Final   APPearance 10/05/2022 HAZY (A)  CLEAR Final   Specific Gravity, Urine 10/05/2022 1.018  1.005 - 1.030 Final   pH 10/05/2022 5.0  5.0 - 8.0 Final   Glucose, UA 10/05/2022 NEGATIVE  NEGATIVE mg/dL Final   Hgb urine dipstick 10/05/2022 NEGATIVE  NEGATIVE Final   Bilirubin Urine 10/05/2022 NEGATIVE  NEGATIVE Final   Ketones, ur 10/05/2022 NEGATIVE  NEGATIVE mg/dL Final   Protein, ur 39/76/7341 NEGATIVE  NEGATIVE mg/dL Final   Nitrite 93/79/0240 NEGATIVE  NEGATIVE Final   Leukocytes,Ua 10/05/2022 TRACE (A)  NEGATIVE Final   RBC / HPF 10/05/2022 0-5  0 - 5  RBC/hpf Final   WBC, UA 10/05/2022 0-5  0 - 5 WBC/hpf Final   Bacteria, UA 10/05/2022 RARE (A)  NONE SEEN Final   Squamous Epithelial / HPF 10/05/2022 0-5  0 - 5 Final   Mucus 10/05/2022 PRESENT   Final   Performed at [facility], [address] [place], [place] [address]   Specimen Description 10/05/2022 URINE, CLEAN CATCH   Final   Special Requests 10/05/2022    Final                   Value:NONE Performed at [facility], [address] [place], [place] [address]    Culture 10/05/2022 10,000 COLONIES/mL MULTIPLE SPECIES PRESENT, SUGGEST RECOLLECTION (A)   Final   Report Status 10/05/2022 10/07/2022 FINAL   Final  Admission on 07/04/2022, Discharged on 07/05/2022  Component Date Value Ref Range Status   Sodium 07/04/2022 142  135 - 145 mmol/L Final   Potassium 07/04/2022 4.4  3.5 - 5.1 mmol/L Final   Chloride 07/04/2022 109  98 - 111 mmol/L Final   CO2 07/04/2022 26  22 - 32 mmol/L Final   Glucose, Bld 07/04/2022 96  70 - 99 mg/dL Final   Glucose reference range applies only to samples taken after fasting for at least 8 hours.   BUN 07/04/2022 15  6 - 20 mg/dL Final   Creatinine, Ser 07/04/2022 0.83  0.44 - 1.00 mg/dL Final   Calcium 13/24/401009/21/2023 9.3   8.9 - 10.3 mg/dL Final   Total Protein 27/25/366409/21/2023 7.2  6.5 - 8.1 g/dL Final   Albumin 40/34/742509/21/2023 3.9  3.5 - 5.0 g/dL Final   AST 95/63/875609/21/2023 23  15 - 41 U/L Final   ALT 07/04/2022 28  0 - 44 U/L Final   Alkaline Phosphatase 07/04/2022 77  38 - 126 U/L Final   Total Bilirubin 07/04/2022 0.4  0.3 - 1.2 mg/dL Final   GFR, Estimated 07/04/2022 >60  >60 mL/min Final   Comment: (NOTE) Calculated using the CKD-EPI Creatinine Equation (2021)    Anion gap 07/04/2022 7  5 - 15 Final   Performed at [facility], [address] [place], [place] [address]   Alcohol, Ethyl (B) 07/04/2022 <10  <10 mg/dL Final   Comment: (NOTE) Lowest detectable limit for serum alcohol is 10 mg/dL.  For medical purposes only. Performed at [facility], [address] [place], [place] [address]    WBC 07/04/2022 7.0  4.0 - 10.5 K/uL Final   RBC 07/04/2022 4.18  3.87 - 5.11 MIL/uL Final   Hemoglobin 07/04/2022 12.8  12.0 - 15.0 g/dL Final   HCT 16/60/630109/21/2023 39.1  36.0 - 46.0 % Final   MCV 07/04/2022 93.5  80.0 - 100.0 fL Final   MCH 07/04/2022 30.6  26.0 - 34.0 pg Final   MCHC 07/04/2022 32.7  30.0 - 36.0 g/dL Final   RDW 60/10/932309/21/2023 12.9  11.5 - 15.5 % Final   Platelets 07/04/2022 243  150 - 400 K/uL Final   nRBC 07/04/2022 0.0  0.0 - 0.2 % Final   Neutrophils Relative % 07/04/2022 43  % Final   Neutro Abs 07/04/2022 3.0  1.7 - 7.7 K/uL Final   Lymphocytes Relative 07/04/2022 50  % Final   Lymphs Abs 07/04/2022 3.5  0.7 - 4.0 K/uL Final   Monocytes Relative 07/04/2022 7  % Final   Monocytes Absolute 07/04/2022 0.5  0.1 - 1.0 K/uL Final   Eosinophils Relative 07/04/2022  0  % Final   Eosinophils Absolute 07/04/2022 0.0  0.0 - 0.5 K/uL Final   Basophils Relative 07/04/2022 0  % Final   Basophils Absolute 07/04/2022 0.0  0.0 - 0.1 K/uL Final   Immature Granulocytes 07/04/2022 0  % Final   Abs Immature Granulocytes 07/04/2022 0.01  0.00 - 0.07 K/uL Final   Performed at [facility], [address] [place], [place] [address]   I-stat hCG, quantitative 07/04/2022 <5.0  <5 mIU/mL Final   Comment 3 07/04/2022          Final   Comment:   GEST. AGE      CONC.  (mIU/mL)   <=1 WEEK        5 - 50     2 WEEKS       50 - 500     3 WEEKS       100 - 10,000     4 WEEKS     1,000 - 30,000        FEMALE AND NON-PREGNANT FEMALE:     LESS THAN 5 mIU/mL   Admission on 06/14/2022, Discharged on 06/14/2022  Component Date Value Ref Range Status   Color, UA 06/14/2022 yellow  yellow Final   Clarity, UA 06/14/2022 cloudy (A)  clear Final   Glucose, UA 06/14/2022 negative  negative mg/dL Final   Bilirubin, UA 60/45/4098 negative  negative Final   Ketones, POC UA 06/14/2022 negative  negative mg/dL Final   Spec Grav, UA 11/91/4782 1.025  1.010 - 1.025 Final   Blood, UA 06/14/2022 negative  negative Final   pH, UA 06/14/2022 7.5  5.0 - 8.0 Final   Protein Ur, POC 06/14/2022 =30 (A)  negative mg/dL Final   Urobilinogen, UA 06/14/2022 0.2  0.2 or 1.0 E.U./dL Final   Nitrite, UA 95/62/1308 Negative  Negative Final   Leukocytes, UA 06/14/2022 Negative  Negative Final   Preg Test, Ur 06/14/2022 Negative  Negative Final   Specimen Description 06/14/2022 URINE, CLEAN CATCH   Final   Special Requests 06/14/2022    Final                   Value:NONE Performed at [facility], [address] [place], [place] [address]    Culture 06/14/2022 MULTIPLE SPECIES PRESENT, SUGGEST RECOLLECTION (A)   Final   Report Status 06/14/2022 06/16/2022 FINAL   Final  Admission on 06/07/2022, Discharged on 06/10/2022  Component Date Value Ref Range Status   SARS Coronavirus 2 by RT PCR 06/07/2022 NEGATIVE  NEGATIVE Final   Comment: (NOTE) SARS-CoV-2 target nucleic acids are NOT DETECTED.  The SARS-CoV-2 RNA is generally detectable in upper respiratory specimens during the acute phase of infection. The lowest concentration of SARS-CoV-2 viral copies this assay can detect  is 138 copies/mL. A negative result does not preclude SARS-Cov-2 infection and should not be used as the sole basis for treatment or other patient management decisions. A negative result may occur with  improper specimen collection/handling, submission of specimen other than nasopharyngeal swab, presence of viral mutation(s) within the areas targeted by this assay, and inadequate number of viral copies(<138 copies/mL). A negative result must be combined with clinical observations, patient history, and epidemiological information. The expected result is Negative.  Fact Sheet for Patients:  BloggerCourse.com  Fact Sheet for Healthcare Providers:  SeriousBroker.it  This test is no  t yet approved or cleared by the [place] and  has been authorized for detection and/or diagnosis of SARS-CoV-2 by FDA under an Emergency Use Authorization (EUA). This EUA will remain  in effect (meaning this test can be used) for the duration of the COVID-19 declaration under Section 564(b)(1) of the Act, 21 U.S.C.section 360bbb-3(b)(1), unless the authorization is terminated  or revoked sooner.       Influenza A by PCR 06/07/2022 NEGATIVE  NEGATIVE Final   Influenza B by PCR 06/07/2022 NEGATIVE  NEGATIVE Final   Comment: (NOTE) The Xpert Xpress SARS-CoV-2/FLU/RSV plus assay is intended as an aid in the diagnosis of influenza from Nasopharyngeal swab specimens and should not be used as a sole basis for treatment. Nasal washings and aspirates are unacceptable for Xpert Xpress SARS-CoV-2/FLU/RSV testing.  Fact Sheet for Patients: BloggerCourse.com  Fact Sheet for Healthcare Providers: SeriousBroker.it  This test is not yet approved or cleared by the [place] FDA and has been authorized for detection and/or diagnosis of SARS-CoV-2 by FDA under an Emergency Use  Authorization (EUA). This EUA will remain in effect (meaning this test can be used) for the duration of the COVID-19 declaration under Section 564(b)(1) of the Act, 21 U.S.C. section 360bbb-3(b)(1), unless the authorization is terminated or revoked.  Performed at [facility], [address] [place], [place] [address]    WBC 06/07/2022 5.7  4.0 - 10.5 K/uL Final   RBC 06/07/2022 4.39  3.87 - 5.11 MIL/uL Final   Hemoglobin 06/07/2022 13.2  12.0 - 15.0 g/dL Final   HCT 54/00/8676 40.4  36.0 - 46.0 % Final   MCV 06/07/2022 92.0  80.0 - 100.0 fL Final   MCH 06/07/2022 30.1  26.0 - 34.0 pg Final   MCHC 06/07/2022 32.7  30.0 - 36.0 g/dL Final   RDW 19/50/9326 12.4  11.5 - 15.5 % Final   Platelets 06/07/2022 308  150 - 400 K/uL Final   nRBC 06/07/2022 0.0  0.0 - 0.2 % Final   Neutrophils Relative % 06/07/2022 42  % Final   Neutro Abs 06/07/2022 2.4  1.7 - 7.7 K/uL Final   Lymphocytes Relative 06/07/2022 54  % Final   Lymphs Abs 06/07/2022 3.1  0.7 - 4.0 K/uL Final   Monocytes Relative 06/07/2022 4  % Final   Monocytes Absolute 06/07/2022 0.2  0.1 - 1.0 K/uL Final   Eosinophils Relative 06/07/2022 0  % Final   Eosinophils Absolute 06/07/2022 0.0  0.0 - 0.5 K/uL Final   Basophils Relative 06/07/2022 0  % Final   Basophils Absolute 06/07/2022 0.0  0.0 - 0.1 K/uL Final   Immature Granulocytes 06/07/2022 0  % Final   Abs Immature Granulocytes 06/07/2022 0.01  0.00 - 0.07 K/uL Final   Performed at [facility], [address], [place], [place] [address]   Sodium 06/07/2022 139  135 - 145 mmol/L Final   Potassium 06/07/2022 4.0  3.5 - 5.1 mmol/L Final   Chloride 06/07/2022 104  98 - 111 mmol/L Final   CO2 06/07/2022 28  22 - 32 mmol/L Final   Glucose, Bld 06/07/2022 104 (H)  70 - 99 mg/dL Final   Glucose reference range applies only to samples taken after fasting for at least 8 hours.   BUN 06/07/2022 8  6 - 20 mg/dL Final   Creatinine, Ser 06/07/2022 0.84  0.44 - 1.00 mg/dL  Final   Calcium 80/99/8338 9.1  8.9 - 10.3 mg/dL Final  Total Protein 06/07/2022 6.9  6.5 - 8.1 g/dL Final   Albumin 06/07/2022 3.7  3.5 - 5.0 g/dL Final   AST 06/07/2022 19  15 - 41 U/L Final   ALT 06/07/2022 22  0 - 44 U/L Final   Alkaline Phosphatase 06/07/2022 54  38 - 126 U/L Final   Total Bilirubin 06/07/2022 0.4  0.3 - 1.2 mg/dL Final   GFR, Estimated 06/07/2022 >60  >60 mL/min Final   Comment: (NOTE) Calculated using the CKD-EPI Creatinine Equation (2021)    Anion gap 06/07/2022 7  5 - 15 Final   Performed at [facility], [place] [address] [place], [place] [address]   Hgb A1c MFr Bld 06/07/2022 5.0  4.8 - 5.6 % Final   Comment: (NOTE) Pre diabetes:          5.7%-6.4%  Diabetes:              >6.4%  Glycemic control for   <7.0% adults with diabetes    Mean Plasma Glucose 06/07/2022 96.8  mg/dL Final   Performed at [facility], [place] [address], [place], [place] [address]   TSH 06/07/2022 1.620  0.350 - 4.500 uIU/mL Final   Comment: Performed by a 3rd Generation assay with a functional sensitivity of <=0.01 uIU/mL. Performed at [facility], [place] [address] [place], [place] [address]    RPR Ser Ql 06/07/2022 NON REACTIVE  NON REACTIVE Final   Performed at [facility], [place] [address], [place], [place] [address]   Color, Urine 06/07/2022 YELLOW  YELLOW Final   APPearance 06/07/2022 HAZY (A)  CLEAR Final   Specific Gravity, Urine 06/07/2022 1.018  1.005 - 1.030 Final   pH 06/07/2022 7.0  5.0 - 8.0 Final   Glucose, UA 06/07/2022 NEGATIVE  NEGATIVE mg/dL Final   Hgb urine dipstick 06/07/2022 NEGATIVE  NEGATIVE Final   Bilirubin Urine 06/07/2022 NEGATIVE  NEGATIVE Final   Ketones, ur 06/07/2022 NEGATIVE  NEGATIVE mg/dL Final   Protein, ur 06/07/2022 NEGATIVE  NEGATIVE mg/dL Final   Nitrite 06/07/2022 NEGATIVE  NEGATIVE Final   Leukocytes,Ua 06/07/2022 NEGATIVE  NEGATIVE Final   Performed at [facility], [place] [address] [place], [place] [address]    Cholesterol 06/07/2022 164  0 - 200 mg/dL Final   Triglycerides 06/07/2022 158 (H)  <150 mg/dL Final   HDL 06/07/2022 44  >40 mg/dL Final   Total CHOL/HDL Ratio 06/07/2022 3.7  RATIO Final   VLDL 06/07/2022 32  0 - 40 mg/dL Final   LDL Cholesterol 06/07/2022 88  0 - 99 mg/dL Final   Comment:        Total Cholesterol/HDL:CHD Risk Coronary Heart Disease Risk Table                     Men   Women  1/2 Average Risk   3.4   3.3  Average Risk       5.0   4.4  2 X Average Risk   9.6   7.1  3 X Average Risk  23.4   11.0        Use the calculated Patient Ratio above and the CHD Risk Table to determine the patient's CHD Risk.        ATP III CLASSIFICATION (LDL):  <100     mg/dL   Optimal  100-129  mg/dL   Near or Above  Optimal  130-159  mg/dL   Borderline  735-329  mg/dL   High  >924     mg/dL   Very High Performed at [facility], [address], [place], [place] [address]    HIV Screen 4th Generation wRfx 06/07/2022 Non Reactive  Non Reactive Final   Performed at [facility], [address], [place], [place] [address]   SARSCOV2ONAVIRUS 2 AG 06/07/2022 NEGATIVE  NEGATIVE Final   Comment: (NOTE) SARS-CoV-2 antigen NOT DETECTED.   Negative results are presumptive.  Negative results do not preclude SARS-CoV-2 infection and should not be used as the sole basis for treatment or other patient management decisions, including infection  control decisions, particularly in the presence of clinical signs and  symptoms consistent with COVID-19, or in those who have been in contact with the virus.  Negative results must be combined with clinical observations, patient history, and epidemiological information. The expected result is Negative.  Fact Sheet for Patients: https://www.jennings-kim.com/  Fact Sheet for Healthcare Providers: https://alexander-rogers.biz/  This test is not yet approved or cleared by the [place] FDA and   has been authorized for detection and/or diagnosis of SARS-CoV-2 by FDA under an Emergency Use Authorization (EUA).  This EUA will remain in effect (meaning this test can be used) for the duration of  the COV                          ID-19 declaration under Section 564(b)(1) of the Act, 21 U.S.C. section 360bbb-3(b)(1), unless the authorization is terminated or revoked sooner.     POC Amphetamine UR 06/07/2022 None Detected  NONE DETECTED (Cut Off Level 1000 ng/mL) Final   POC Secobarbital (BAR) 06/07/2022 None Detected  NONE DETECTED (Cut Off Level 300 ng/mL) Final   POC Buprenorphine (BUP) 06/07/2022 None Detected  NONE DETECTED (Cut Off Level 10 ng/mL) Final   POC Oxazepam (BZO) 06/07/2022 None Detected  NONE DETECTED (Cut Off Level 300 ng/mL) Final   POC Cocaine UR 06/07/2022 None Detected  NONE DETECTED (Cut Off Level 300 ng/mL) Final   POC Methamphetamine UR 06/07/2022 None Detected  NONE DETECTED (Cut Off Level 1000 ng/mL) Final   POC Morphine 06/07/2022 None Detected  NONE DETECTED (Cut Off Level 300 ng/mL) Final   POC Methadone UR 06/07/2022 None Detected  NONE DETECTED (Cut Off Level 300 ng/mL) Final   POC Oxycodone UR 06/07/2022 None Detected  NONE DETECTED (Cut Off Level 100 ng/mL) Final   POC Marijuana UR 06/07/2022 None Detected  NONE DETECTED (Cut Off Level 50 ng/mL) Final   Preg Test, Ur 06/08/2022 NEGATIVE  NEGATIVE Final   Comment:        THE SENSITIVITY OF THIS METHODOLOGY IS >20 mIU/mL. Performed at [facility], [address], [place], [place] [address]    Preg Test, Ur 06/08/2022 NEGATIVE  NEGATIVE Final   Comment:        THE SENSITIVITY OF THIS METHODOLOGY IS >24 mIU/mL     Blood Alcohol level:  Lab Results  Component Value Date   ETH <10 07/04/2022   ETH <10 02/07/2022    Metabolic Disorder Labs: Lab Results  Component Value Date   HGBA1C 5.7 (H) 10/15/2022   MPG 117 10/15/2022   MPG 96.8 06/07/2022   No results found for:  "PROLACTIN" Lab Results  Component Value Date   CHOL 181 07/25/2022   TRIG 118 07/25/2022   HDL 45 07/25/2022  CHOLHDL 4.0 07/25/2022   VLDL 24 07/25/2022   LDLCALC 112 (H) 07/25/2022   LDLCALC 88 06/07/2022    Therapeutic Lab Levels: No results found for: "LITHIUM" No results found for: "VALPROATE" No results found for: "CBMZ"  Physical Findings   GAD-7    Flowsheet Row Office Visit [facility]/17/2021 in [facility]  Total GAD-7 Score 17      PHQ2-9    Flowsheet Row ED from 06/07/2022 in [facility] Office Visit from 12/17/2021 in [facility]  PHQ-2 Total Score 1 2  PHQ-9 Total Score 2 8      Flowsheet Row ED from 10/07/2022 in [facility] Most recent reading at 10/20/2022 11:04 AM ED from 10/07/2022 in [facility] EMERGENCY DEPARTMENT Most recent reading at 10/07/2022 12:18 PM ED from 07/25/2022 in [facility] Most recent reading at 10/06/2022  9:38 AM  C-SSRS RISK CATEGORY No Risk No Risk No Risk        Musculoskeletal  Strength & Muscle Tone: within normal limits Gait & Station: normal Patient leans: N/A  Psychiatric Specialty Exam  Presentation  General Appearance:  Appropriate for Environment; Casual  Eye Contact: Good  Speech: Clear and Coherent; Normal Rate  Speech Volume: Normal  Handedness: Right   Mood and Affect  Mood: Euthymic  Affect: Congruent   Thought Process  Thought Processes: Coherent  Descriptions of Associations:Intact  Orientation:Full (Time, Place and Person)  Thought Content:Logical  Diagnosis of Schizophrenia or Schizoaffective disorder in past: No    Hallucinations:Hallucinations: None  Ideas of Reference:None  Suicidal Thoughts:Suicidal Thoughts: No  Homicidal Thoughts:Homicidal Thoughts: No   Sensorium  Memory: Immediate Good; Recent Good; Remote  Good  Judgment: Good  Insight: Good   Executive Functions  Concentration: Good  Attention Span: Good  Recall: Good  Fund of Knowledge: Good  Language: Good   Psychomotor Activity  Psychomotor Activity: Psychomotor Activity: Normal   Assets  Assets: Communication Skills; Desire for Improvement; Leisure Time; Physical Health; Resilience; Social Support   Sleep  Sleep: Sleep: Good   No data recorded  Physical Exam  Physical Exam Vitals and nursing note reviewed.  Constitutional:      General: She is not in acute distress.    Appearance: Normal appearance. She is not ill-appearing.  HENT:     Head: Normocephalic.  Eyes:     General:        Right eye: No discharge.        Left eye: No discharge.     Conjunctiva/sclera: Conjunctivae normal.  Cardiovascular:     Rate and Rhythm: Normal rate.  Pulmonary:     Effort: Pulmonary effort is normal.  Musculoskeletal:        General: Normal range of motion.     Cervical back: Normal range of motion.  Skin:    Coloration: Skin is not jaundiced or pale.  Neurological:     Mental Status: She is alert and oriented to person, place, and time.  Psychiatric:        Attention and Perception: Perception normal.        Mood and Affect: Mood and affect normal.        Speech: Speech normal.        Behavior: Behavior normal. Behavior is cooperative.        Thought Content: Thought content normal.        Cognition and Memory:  Cognition normal.        Judgment: Judgment normal.    Review of Systems  Constitutional: Negative.   HENT: Negative.    Eyes: Negative.   Respiratory: Negative.    Cardiovascular: Negative.   Genitourinary: Negative.   Musculoskeletal: Negative.   Skin: Negative.   Neurological: Negative.   Psychiatric/Behavioral: Negative.     Blood pressure 96/70, pulse 98, temperature 98.1 F (36.7 C), temperature source Oral, resp. rate 18, height 5\' 1"  (1.549 m), weight 198 lb (89.8 kg), SpO2 99  %. Body mass index is 37.41 kg/m.  Treatment Plan Summary: [facility]. Patient remains psychiatrically stable and ready for dc. Barrier to [employer] is safe housing. SW/DSS continue to seek placement.     Ardis Hughs, NP 10/20/2022 3:47 PM

## 2022-10-20 NOTE — ED Notes (Signed)
Pt sleeping at present, no distress noted.  Monitoring for safety. 

## 2022-10-20 NOTE — ED Notes (Signed)
Patient alert and oriented x 3. Denies SI/HI/AVH. Denies intent or plan to harm self or others. Routine conducted according to faculty protocol. Encourage patient to notify staff with any needs or concerns. Patient verbalized agreement and understanding. Will continue to monitor for safety. 

## 2022-10-21 ENCOUNTER — Encounter (HOSPITAL_COMMUNITY): Payer: Self-pay

## 2022-10-21 ENCOUNTER — Ambulatory Visit (HOSPITAL_COMMUNITY): Payer: Medicare HMO | Admitting: Psychiatry

## 2022-10-21 ENCOUNTER — Encounter (HOSPITAL_COMMUNITY): Payer: Self-pay | Admitting: Registered Nurse

## 2022-10-21 DIAGNOSIS — E039 Hypothyroidism, unspecified: Secondary | ICD-10-CM | POA: Diagnosis not present

## 2022-10-21 DIAGNOSIS — E118 Type 2 diabetes mellitus with unspecified complications: Secondary | ICD-10-CM | POA: Diagnosis not present

## 2022-10-21 DIAGNOSIS — F333 Major depressive disorder, recurrent, severe with psychotic symptoms: Secondary | ICD-10-CM | POA: Diagnosis not present

## 2022-10-21 DIAGNOSIS — N390 Urinary tract infection, site not specified: Secondary | ICD-10-CM | POA: Diagnosis not present

## 2022-10-21 DIAGNOSIS — F209 Schizophrenia, unspecified: Secondary | ICD-10-CM | POA: Diagnosis not present

## 2022-10-21 DIAGNOSIS — Z1152 Encounter for screening for COVID-19: Secondary | ICD-10-CM | POA: Diagnosis not present

## 2022-10-21 DIAGNOSIS — F17200 Nicotine dependence, unspecified, uncomplicated: Secondary | ICD-10-CM | POA: Diagnosis not present

## 2022-10-21 DIAGNOSIS — F419 Anxiety disorder, unspecified: Secondary | ICD-10-CM | POA: Diagnosis not present

## 2022-10-21 DIAGNOSIS — N9489 Other specified conditions associated with female genital organs and menstrual cycle: Secondary | ICD-10-CM | POA: Diagnosis not present

## 2022-10-21 NOTE — ED Notes (Signed)
Pt received breakfast 

## 2022-10-21 NOTE — ED Provider Notes (Signed)
Behavioral Health Progress Note  Date and Time: 10/21/2022 11:37 AM Name: Nicole [name] MRN:  [id number]  HPI Nicole [name] 32 y.o., female patient with schizophrenia, MDD with psychosis,  presented to [facility], voluntarily, as a walk-in accompanied by [employer] after a disagreement and verbal altercation with personnel at the group home.  She has been dismissed previously from other group homes due to threatening behaviors, and suicidal ideation and the one which she was residing in.  Patient was admitted to continuous assessment unit on 07/25/2022 as she also endorsed passive SI at that time along with symptoms of visual hallucination. Patient was subsequently psychiatrically cleared during her admission to the continuous assessment unit.  However patient remains here at [facility] bordering until adequate- residential placement can be obtained.    Legal Guardian: Mom Nicole [name]) transitioning to be Dad Nicole [name]) Point of contact: Dad Nicole [name])   Tests administered: Wechsler Adult Intelligence Scale-4 (see Jolene Provost, PhD consult note on 08/19/2022 for full info) [place]-3 Adaptive Behavior Scales Assessment completed on 09/13/2022 administered by [name], MD  Subjective:  "I'm just ready to go."  Nicole [name], 32 y.o., female patient seen face to face by this provider, consulted with Dr. Nelly Rout; and chart reviewed on 10/21/22.  On evaluation Kazia Grisanti reports there are no changes.  She continues to deny suicidal/self-harm/homicidal ideation, psychosis, and paranoia.  States she is eating/sleeping without difficulty and taking medications with no adverse reaction.  Denies any medical complaints.  Reports she had a meeting today with   Meeting today with social work and team regarding placement and any issues that is preventing transfer/discharge   Diagnosis:  Final diagnoses:  Tobacco use disorder  Schizophrenia, unspecified type (HCC)  MDD (major depressive  disorder), recurrent episode, moderate (HCC)  Hypothyroidism, unspecified type    Total Time spent with patient: 15 minutes  Past Psychiatric History: schizoaffective, borderline IDD, anxiety, and MDD  Past Medical History:  Past Medical History:  Diagnosis Date   Anxiety    Depression    Hypothyroidism 08/07/2022   Tobacco use disorder 08/07/2022    Past Surgical History:  Procedure Laterality Date   WISDOM TOOTH EXTRACTION Bilateral 2020   Family History:  Family History  Problem Relation Age of Onset   Hypertension Father    Diabetes Father    Family Psychiatric  History: None reported Social History:  Social History   Substance and Sexual Activity  Alcohol Use Never     Social History   Substance and Sexual Activity  Drug Use Never    Social History   Socioeconomic History   Marital status: Single    Spouse name: Not on file   Number of children: Not on file   Years of education: Not on file   Highest education level: Not on file  Occupational History   Not on file  Tobacco Use   Smoking status: Every Day    Types: Cigarettes   Smokeless tobacco: Never  Vaping Use   Vaping Use: Some days  Substance and Sexual Activity   Alcohol use: Never   Drug use: Never   Sexual activity: Yes    Partners: Female    Birth control/protection: Implant  Other Topics Concern   Not on file  Social History Narrative   Not on file   Social Determinants of Health   Financial Resource Strain: Not on file  Food Insecurity: Not on file  Transportation Needs: Not on file  Physical Activity: Not  on file  Stress: Not on file  Social Connections: Not on file   SDOH:  SDOH Screenings   Depression (PHQ2-9): Low Risk  (06/10/2022)  Tobacco Use: High Risk (10/21/2022)   Additional Social History:     Sleep: Good  Appetite:  Good  Current Medications:  Current Facility-Administered Medications  Medication Dose Route Frequency Provider Last Rate Last Admin    acetaminophen (TYLENOL) tablet 650 mg  650 mg Oral Q6H PRN Rayburn GoByungura, Veronique M, NP       alum & mag hydroxide-simeth (MAALOX/MYLANTA) 200-200-20 MG/5ML suspension 30 mL  30 mL Oral Q4H PRN Rayburn GoByungura, Veronique M, NP   30 mL at 10/20/22 2201   cetaphil lotion   Topical PRN Princess BruinsNguyen, Julie, DO       fluticasone [facility](FLONASE) 50 MCG/ACT nasal spray 1 spray  1 spray Each Nare QHS Princess BruinsNguyen, Julie, DO   1 spray at 10/20/22 2202   hydrOXYzine (ATARAX) tablet 25 mg  25 mg Oral TID PRN Lorri Frederickarrion-Carrero, Margely, MD   25 mg at 10/20/22 2039   levothyroxine (SYNTHROID) tablet 100 mcg  100 mcg Oral Once Byungura, Veronique M, NP       levothyroxine (SYNTHROID) tablet 100 mcg  100 mcg Oral Q0600 Marlou SaByungura, Veronique M, NP   100 mcg at 10/21/22 [id number]   loratadine (CLARITIN) tablet 10 mg  10 mg Oral Daily Carrion-Carrero, Karle StarchMargely, MD   10 mg at 10/21/22 0902   magnesium hydroxide (MILK OF MAGNESIA) suspension 30 mL  30 mL Oral Daily PRN Marlou SaByungura, Veronique M, NP   30 mL at 10/12/22 2145   metFORMIN (GLUCOPHAGE) tablet 500 mg  500 mg Oral Q breakfast Carrion-Carrero, Karle StarchMargely, MD   500 mg at 10/21/22 [id number]   nicotine (NICODERM CQ - dosed in mg/24 hours) patch 14 mg  14 mg Transdermal Daily PRN Carrion-Carrero, Karle StarchMargely, MD       ondansetron (ZOFRAN-ODT) disintegrating tablet 4 mg  4 mg Oral Q8H PRN Carrion-Carrero, Margely, MD   4 mg at 10/21/22 0205   Oxcarbazepine (TRILEPTAL) tablet 300 mg  300 mg Oral BID Byungura, Veronique M, NP   300 mg at 10/21/22 0902   pantoprazole (PROTONIX) EC tablet 40 mg  40 mg Oral Daily Carrion-Carrero, Karle StarchMargely, MD   40 mg at 10/21/22 0902   polyethylene glycol (MIRALAX / GLYCOLAX) packet 17 g  17 g Oral Daily Princess BruinsNguyen, Julie, DO   17 g at 10/21/22 0902   QUEtiapine (SEROQUEL) tablet 400 mg  400 mg Oral BID Rayburn GoByungura, Veronique M, NP   400 mg at 10/21/22 0902   sertraline (ZOLOFT) tablet 150 mg  150 mg Oral Daily Carrion-Carrero, Margely, MD   150 mg at 10/21/22 0902   sodium chloride (OCEAN) 0.65 %  nasal spray 1 spray  1 spray Each Nare Daily Princess BruinsNguyen, Julie, DO   1 spray at 10/21/22 0902   traZODone (DESYREL) tablet 100 mg  100 mg Oral QHS Olin PiaByungura, Veronique M, NP   100 mg at 10/20/22 2202   traZODone (DESYREL) tablet 50 mg  50 mg Oral QHS PRN Marlou SaByungura, Veronique M, NP   50 mg at 10/20/22 2201   valACYclovir (VALTREX) tablet 500 mg  500 mg Oral Daily Carrion-Carrero, Karle StarchMargely, MD   500 mg at 10/21/22 0902   Current Outpatient Medications  Medication Sig Dispense Refill   ABILIFY MAINTENA 400 MG PRSY prefilled syringe 400 mg every 28 (twenty-eight) days.     cetirizine (ZYRTEC) 10 MG tablet Take 10 mg by mouth daily.  cyclobenzaprine (FLEXERIL) 10 MG tablet Take 1 tablet (10 mg total) by mouth 2 (two) times daily as needed for muscle spasms. 20 tablet 0   fluticasone (FLONASE) 50 MCG/ACT nasal spray Place 1 spray into both nostrils daily.     meloxicam (MOBIC) 15 MG tablet Take 15 mg by mouth daily.     nitrofurantoin, macrocrystal-monohydrate, (MACROBID) 100 MG capsule Take 1 capsule (100 mg total) by mouth 2 (two) times daily. 10 capsule 0   ondansetron (ZOFRAN-ODT) 4 MG disintegrating tablet Take 1 tablet (4 mg total) by mouth every 8 (eight) hours as needed for nausea or vomiting. 20 tablet 0   Oxcarbazepine (TRILEPTAL) 300 MG tablet Take 1 tablet (300 mg total) by mouth 2 (two) times daily. 60 tablet 0   QUEtiapine (SEROQUEL) 400 MG tablet Take 1 tablet (400 mg total) by mouth 2 (two) times daily. 60 tablet 0   sertraline (ZOLOFT) 50 MG tablet Take 3 tablets (150 mg total) by mouth in the morning. 90 tablet 0   traZODone (DESYREL) 100 MG tablet Take 1 tablet (100 mg total) by mouth at bedtime. 30 tablet 0   valACYclovir (VALTREX) 500 MG tablet Take 500 mg by mouth daily.      Labs  Lab Results:  Admission on 10/07/2022  Component Date Value Ref Range Status   Hgb A1c MFr Bld 10/15/2022 5.7 (H)  4.8 - 5.6 % Final   Comment: (NOTE)         Prediabetes: 5.7 - 6.4          Diabetes: >6.4         Glycemic control for adults with diabetes: <7.0    Mean Plasma Glucose 10/15/2022 117  mg/dL Final   Comment: (NOTE) Performed At: [facility] [address] [place], [place] [id number] Jolene Schimke MD WU:[id number]   Admission on 10/07/2022, Discharged on 10/07/2022  Component Date Value Ref Range Status   Color, Urine 10/07/2022 YELLOW  YELLOW Final   APPearance 10/07/2022 HAZY (A)  CLEAR Final   Specific Gravity, Urine 10/07/2022 1.011  1.005 - 1.030 Final   pH 10/07/2022 7.0  5.0 - 8.0 Final   Glucose, UA 10/07/2022 NEGATIVE  NEGATIVE mg/dL Final   Hgb urine dipstick 10/07/2022 SMALL (A)  NEGATIVE Final   Bilirubin Urine 10/07/2022 NEGATIVE  NEGATIVE Final   Ketones, ur 10/07/2022 NEGATIVE  NEGATIVE mg/dL Final   Protein, ur 95/62/1308 NEGATIVE  NEGATIVE mg/dL Final   Nitrite 65/78/4696 NEGATIVE  NEGATIVE Final   Leukocytes,Ua 10/07/2022 LARGE (A)  NEGATIVE Final   RBC / HPF 10/07/2022 0-5  0 - 5 RBC/hpf Final   WBC, UA 10/07/2022 0-5  0 - 5 WBC/hpf Final   Bacteria, UA 10/07/2022 FEW (A)  NONE SEEN Final   Squamous Epithelial / HPF 10/07/2022 11-20  0 - 5 Final   Performed at [facility], [address] [place], [place] [address]   I-stat hCG, quantitative 10/07/2022 <5.0  <5 mIU/mL Final   Comment 3 10/07/2022          Final   Comment:   GEST. AGE      CONC.  (mIU/mL)   <=1 WEEK        5 - 50     2 WEEKS       50 - 500     3 WEEKS       100 - 10,000     4 WEEKS     1,000 - 30,000  FEMALE AND NON-PREGNANT FEMALE:     LESS THAN 5 mIU/mL    Sodium 10/07/2022 138  135 - 145 mmol/L Final   Potassium 10/07/2022 4.3  3.5 - 5.1 mmol/L Final   Chloride 10/07/2022 104  98 - 111 mmol/L Final   CO2 10/07/2022 28  22 - 32 mmol/L Final   Glucose, Bld 10/07/2022 90  70 - 99 mg/dL Final   Glucose reference range applies only to samples taken after fasting for at least 8 hours.   BUN 10/07/2022 8  6 - 20 mg/dL Final   Creatinine, Ser  10/07/2022 0.96  0.44 - 1.00 mg/dL Final   Calcium 95/62/1308 9.1  8.9 - 10.3 mg/dL Final   GFR, Estimated 10/07/2022 >60  >60 mL/min Final   Comment: (NOTE) Calculated using the CKD-EPI Creatinine Equation (2021)    Anion gap 10/07/2022 6  5 - 15 Final   Performed at [facility], [address], [place], [place] [address]   WBC 10/07/2022 5.8  4.0 - 10.5 K/uL Final   RBC 10/07/2022 4.36  3.87 - 5.11 MIL/uL Final   Hemoglobin 10/07/2022 12.9  12.0 - 15.0 g/dL Final   HCT 69/62/9528 40.0  36.0 - 46.0 % Final   MCV 10/07/2022 91.7  80.0 - 100.0 fL Final   MCH 10/07/2022 29.6  26.0 - 34.0 pg Final   MCHC 10/07/2022 32.3  30.0 - 36.0 g/dL Final   RDW 41/32/4401 12.4  11.5 - 15.5 % Final   Platelets 10/07/2022 295  150 - 400 K/uL Final   nRBC 10/07/2022 0.0  0.0 - 0.2 % Final   Performed at [facility], [address], [place], [place] [address]   SARS Coronavirus 2 by RT PCR 10/07/2022 NEGATIVE  NEGATIVE Final   Comment: (NOTE) SARS-CoV-2 target nucleic acids are NOT DETECTED.  The SARS-CoV-2 RNA is generally detectable in upper respiratory specimens during the acute phase of infection. The lowest concentration of SARS-CoV-2 viral copies this assay can detect is 138 copies/mL. A negative result does not preclude SARS-Cov-2 infection and should not be used as the sole basis for treatment or other patient management decisions. A negative result may occur with  improper specimen collection/handling, submission of specimen other than nasopharyngeal swab, presence of viral mutation(s) within the areas targeted by this assay, and inadequate number of viral copies(<138 copies/mL). A negative result must be combined with clinical observations, patient history, and epidemiological information. The expected result is Negative.  Fact Sheet for Patients:  BloggerCourse.com  Fact Sheet for Healthcare Providers:   SeriousBroker.it  This test is no                          t yet approved or cleared by the [place] FDA and  has been authorized for detection and/or diagnosis of SARS-CoV-2 by FDA under an Emergency Use Authorization (EUA). This EUA will remain  in effect (meaning this test can be used) for the duration of the COVID-19 declaration under Section 564(b)(1) of the Act, 21 U.S.C.section 360bbb-3(b)(1), unless the authorization is terminated  or revoked sooner.       Influenza A by PCR 10/07/2022 NEGATIVE  NEGATIVE Final   Influenza B by PCR 10/07/2022 NEGATIVE  NEGATIVE Final   Comment: (NOTE) The Xpert Xpress SARS-CoV-2/FLU/RSV plus assay is intended as an aid in the diagnosis of influenza from Nasopharyngeal swab specimens and should not be used as a sole basis for  treatment. Nasal washings and aspirates are unacceptable for Xpert Xpress SARS-CoV-2/FLU/RSV testing.  Fact Sheet for Patients: BloggerCourse.com  Fact Sheet for Healthcare Providers: SeriousBroker.it  This test is not yet approved or cleared by the [place] FDA and has been authorized for detection and/or diagnosis of SARS-CoV-2 by FDA under an Emergency Use Authorization (EUA). This EUA will remain in effect (meaning this test can be used) for the duration of the COVID-19 declaration under Section 564(b)(1) of the Act, 21 U.S.C. section 360bbb-3(b)(1), unless the authorization is terminated or revoked.     Resp Syncytial Virus by PCR 10/07/2022 NEGATIVE  NEGATIVE Final   Comment: (NOTE) Fact Sheet for Patients: BloggerCourse.com  Fact Sheet for Healthcare Providers: SeriousBroker.it  This test is not yet approved or cleared by the [place] FDA and has been authorized for detection and/or diagnosis of SARS-CoV-2 by FDA under an Emergency Use Authorization (EUA). This EUA  will remain in effect (meaning this test can be used) for the duration of the COVID-19 declaration under Section 564(b)(1) of the Act, 21 U.S.C. section 360bbb-3(b)(1), unless the authorization is terminated or revoked.  Performed at [facility], [address], [place], [place] [address]    Specimen Description 10/07/2022 URINE, CLEAN CATCH   Final   Special Requests 10/07/2022    Final                   Value:NONE Performed at [facility], [address] [place], [place] [address]    Culture 10/07/2022 MULTIPLE SPECIES PRESENT, SUGGEST RECOLLECTION (A)   Final   Report Status 10/07/2022 10/08/2022 FINAL   Final  Admission on 07/25/2022, Discharged on 10/07/2022  Component Date Value Ref Range Status   SARS Coronavirus 2 by RT PCR 07/25/2022 NEGATIVE  NEGATIVE Final   Comment: (NOTE) SARS-CoV-2 target nucleic acids are NOT DETECTED.  The SARS-CoV-2 RNA is generally detectable in upper respiratory specimens during the acute phase of infection. The lowest concentration of SARS-CoV-2 viral copies this assay can detect is 138 copies/mL. A negative result does not preclude SARS-Cov-2 infection and should not be used as the sole basis for treatment or other patient management decisions. A negative result may occur with  improper specimen collection/handling, submission of specimen other than nasopharyngeal swab, presence of viral mutation(s) within the areas targeted by this assay, and inadequate number of viral copies(<138 copies/mL). A negative result must be combined with clinical observations, patient history, and epidemiological information. The expected result is Negative.  Fact Sheet for Patients:  BloggerCourse.com  Fact Sheet for Healthcare Providers:  SeriousBroker.it  This test is no                          t yet approved or cleared by the [place] FDA and  has been authorized for detection and/or  diagnosis of SARS-CoV-2 by FDA under an Emergency Use Authorization (EUA). This EUA will remain  in effect (meaning this test can be used) for the duration of the COVID-19 declaration under Section 564(b)(1) of the Act, 21 U.S.C.section 360bbb-3(b)(1), unless the authorization is terminated  or revoked sooner.       Influenza A by PCR 07/25/2022 NEGATIVE  NEGATIVE Final   Influenza B by PCR 07/25/2022 NEGATIVE  NEGATIVE Final   Comment: (NOTE) The Xpert Xpress SARS-CoV-2/FLU/RSV plus assay is intended as an aid in the diagnosis of influenza from Nasopharyngeal swab specimens and should not be used as a  sole basis for treatment. Nasal washings and aspirates are unacceptable for Xpert Xpress SARS-CoV-2/FLU/RSV testing.  Fact Sheet for Patients: BloggerCourse.com  Fact Sheet for Healthcare Providers: SeriousBroker.it  This test is not yet approved or cleared by the [place] FDA and has been authorized for detection and/or diagnosis of SARS-CoV-2 by FDA under an Emergency Use Authorization (EUA). This EUA will remain in effect (meaning this test can be used) for the duration of the COVID-19 declaration under Section 564(b)(1) of the Act, 21 U.S.C. section 360bbb-3(b)(1), unless the authorization is terminated or revoked.  Performed at [facility], [address] [place], [place] [address]    WBC 07/25/2022 8.3  4.0 - 10.5 K/uL Final   RBC 07/25/2022 4.44  3.87 - 5.11 MIL/uL Final   Hemoglobin 07/25/2022 13.7  12.0 - 15.0 g/dL Final   HCT 81/10/7508 40.2  36.0 - 46.0 % Final   MCV 07/25/2022 90.5  80.0 - 100.0 fL Final   MCH 07/25/2022 30.9  26.0 - 34.0 pg Final   MCHC 07/25/2022 34.1  30.0 - 36.0 g/dL Final   RDW 25/85/2778 12.2  11.5 - 15.5 % Final   Platelets 07/25/2022 248  150 - 400 K/uL Final   nRBC 07/25/2022 0.0  0.0 - 0.2 % Final   Neutrophils Relative % 07/25/2022 43  % Final   Neutro Abs 07/25/2022 3.6   1.7 - 7.7 K/uL Final   Lymphocytes Relative 07/25/2022 52  % Final   Lymphs Abs 07/25/2022 4.2 (H)  0.7 - 4.0 K/uL Final   Monocytes Relative 07/25/2022 5  % Final   Monocytes Absolute 07/25/2022 0.4  0.1 - 1.0 K/uL Final   Eosinophils Relative 07/25/2022 0  % Final   Eosinophils Absolute 07/25/2022 0.0  0.0 - 0.5 K/uL Final   Basophils Relative 07/25/2022 0  % Final   Basophils Absolute 07/25/2022 0.0  0.0 - 0.1 K/uL Final   Immature Granulocytes 07/25/2022 0  % Final   Abs Immature Granulocytes 07/25/2022 0.02  0.00 - 0.07 K/uL Final   Performed at [facility], [address] [place], [place] [address]   Sodium 07/25/2022 138  135 - 145 mmol/L Final   Potassium 07/25/2022 4.0  3.5 - 5.1 mmol/L Final   Chloride 07/25/2022 104  98 - 111 mmol/L Final   CO2 07/25/2022 29  22 - 32 mmol/L Final   Glucose, Bld 07/25/2022 83  70 - 99 mg/dL Final   Glucose reference range applies only to samples taken after fasting for at least 8 hours.   BUN 07/25/2022 11  6 - 20 mg/dL Final   Creatinine, Ser 07/25/2022 0.97  0.44 - 1.00 mg/dL Final   Calcium 36/14/4315 9.2  8.9 - 10.3 mg/dL Final   Total Protein 40/05/6760 7.0  6.5 - 8.1 g/dL Final   Albumin 95/06/3266 3.8  3.5 - 5.0 g/dL Final   AST 12/45/8099 18  15 - 41 U/L Final   ALT 07/25/2022 22  0 - 44 U/L Final   Alkaline Phosphatase 07/25/2022 64  38 - 126 U/L Final   Total Bilirubin 07/25/2022 0.2 (L)  0.3 - 1.2 mg/dL Final   GFR, Estimated 07/25/2022 >60  >60 mL/min Final   Comment: (NOTE) Calculated using the CKD-EPI Creatinine Equation (2021)    Anion gap 07/25/2022 5  5 - 15 Final   Performed at [facility], [address], [place], [place] [address]   POC Amphetamine UR 07/25/2022 None Detected  NONE DETECTED (Cut Off Level 1000 ng/mL) Preliminary   POC Secobarbital (BAR) 07/25/2022 None Detected  NONE DETECTED (Cut Off Level 300 ng/mL) Preliminary   POC Buprenorphine (BUP) 07/25/2022 None Detected  NONE DETECTED (Cut Off  Level 10 ng/mL) Preliminary   POC Oxazepam (BZO) 07/25/2022 None Detected  NONE DETECTED (Cut Off Level 300 ng/mL) Preliminary   POC Cocaine UR 07/25/2022 None Detected  NONE DETECTED (Cut Off Level 300 ng/mL) Preliminary   POC Methamphetamine UR 07/25/2022 None Detected  NONE DETECTED (Cut Off Level 1000 ng/mL) Preliminary   POC Morphine 07/25/2022 None Detected  NONE DETECTED (Cut Off Level 300 ng/mL) Preliminary   POC Methadone UR 07/25/2022 None Detected  NONE DETECTED (Cut Off Level 300 ng/mL) Preliminary   POC Oxycodone UR 07/25/2022 None Detected  NONE DETECTED (Cut Off Level 100 ng/mL) Preliminary   POC Marijuana UR 07/25/2022 None Detected  NONE DETECTED (Cut Off Level 50 ng/mL) Preliminary   SARSCOV2ONAVIRUS 2 AG 07/25/2022 NEGATIVE  NEGATIVE Final   Comment: (NOTE) SARS-CoV-2 antigen NOT DETECTED.   Negative results are presumptive.  Negative results do not preclude SARS-CoV-2 infection and should not be used as the sole basis for treatment or other patient management decisions, including infection  control decisions, particularly in the presence of clinical signs and  symptoms consistent with COVID-19, or in those who have been in contact with the virus.  Negative results must be combined with clinical observations, patient history, and epidemiological information. The expected result is Negative.  Fact Sheet for Patients: https://www.jennings-kim.com/  Fact Sheet for Healthcare Providers: https://alexander-rogers.biz/  This test is not yet approved or cleared by the [place] FDA and  has been authorized for detection and/or diagnosis of SARS-CoV-2 by FDA under an Emergency Use Authorization (EUA).  This EUA will remain in effect (meaning this test can be used) for the duration of  the COV                          ID-19 declaration under Section 564(b)(1) of the Act, 21 U.S.C. section 360bbb-3(b)(1), unless the authorization is terminated or  revoked sooner.     Cholesterol 07/25/2022 181  0 - 200 mg/dL Final   Triglycerides 32/95/1884 118  <150 mg/dL Final   HDL 16/60/6301 45  >40 mg/dL Final   Total CHOL/HDL Ratio 07/25/2022 4.0  RATIO Final   VLDL 07/25/2022 24  0 - 40 mg/dL Final   LDL Cholesterol 07/25/2022 112 (H)  0 - 99 mg/dL Final   Comment:        Total Cholesterol/HDL:CHD Risk Coronary Heart Disease Risk Table                     Men   Women  1/2 Average Risk   3.4   3.3  Average Risk       5.0   4.4  2 X Average Risk   9.6   7.1  3 X Average Risk  23.4   11.0        Use the calculated Patient Ratio above and the CHD Risk Table to determine the patient's CHD Risk.        ATP III CLASSIFICATION (LDL):  <100     mg/dL   Optimal  601-093  mg/dL   Near or Above                    Optimal  130-159  mg/dL  Borderline  160-189  mg/dL   High  >782     mg/dL   Very High Performed at [facility], [address] [place], [place] [address]    TSH 07/25/2022 6.668 (H)  0.350 - 4.500 uIU/mL Final   Comment: Performed by a 3rd Generation assay with a functional sensitivity of <=0.01 uIU/mL. Performed at [facility], [address], [place], [place] [address]    Glucose-Capillary 07/26/2022 104 (H)  70 - 99 mg/dL Final   Glucose reference range applies only to samples taken after fasting for at least 8 hours.   T3, Free 07/31/2022 2.3  2.0 - 4.4 pg/mL Final   Comment: (NOTE) Performed At: [facility] [address] [place], [place] [id number] Jolene Schimke MD MW:[id number]    Free T4 07/31/2022 0.60 (L)  0.61 - 1.12 ng/dL Final   Comment: (NOTE) Biotin ingestion may interfere with free T4 tests. If the results are inconsistent with the TSH level, previous test results, or the clinical presentation, then consider biotin interference. If needed, order repeat testing after stopping biotin. Performed at [facility], [address], [place], [place] [address]    Glucose-Capillary  08/29/2022 100 (H)  70 - 99 mg/dL Final   Glucose reference range applies only to samples taken after fasting for at least 8 hours.   TSH 09/05/2022 0.793  0.350 - 4.500 uIU/mL Final   Comment: Performed by a 3rd Generation assay with a functional sensitivity of <=0.01 uIU/mL. Performed at [facility], [address], [place], [place] [address]    Free T4 09/05/2022 0.73  0.61 - 1.12 ng/dL Final   Comment: (NOTE) Biotin ingestion may interfere with free T4 tests. If the results are inconsistent with the TSH level, previous test results, or the clinical presentation, then consider biotin interference. If needed, order repeat testing after stopping biotin. Performed at [facility], [address] [place], [place] [address]    Preg Test, Ur 09/06/2022 Negative  Negative Final   Preg Test, Ur 09/06/2022 NEGATIVE  NEGATIVE Final   Comment:        THE SENSITIVITY OF THIS METHODOLOGY IS >24 mIU/mL    Preg Test, Ur 09/05/2022 NEGATIVE  NEGATIVE Final   Comment:        THE SENSITIVITY OF THIS METHODOLOGY IS >24 mIU/mL    Sodium 09/13/2022 136  135 - 145 mmol/L Final   Potassium 09/13/2022 4.5  3.5 - 5.1 mmol/L Final   Chloride 09/13/2022 105  98 - 111 mmol/L Final   CO2 09/13/2022 21 (L)  22 - 32 mmol/L Final   Glucose, Bld 09/13/2022 111 (H)  70 - 99 mg/dL Final   Glucose reference range applies only to samples taken after fasting for at least 8 hours.   BUN 09/13/2022 14  6 - 20 mg/dL Final   Creatinine, Ser 09/13/2022 0.92  0.44 - 1.00 mg/dL Final   Calcium 95/63/8756 9.1  8.9 - 10.3 mg/dL Final   GFR, Estimated 09/13/2022 >60  >60 mL/min Final   Comment: (NOTE) Calculated using the CKD-EPI Creatinine Equation (2021)    Anion gap 09/13/2022 10  5 - 15 Final   Performed at [facility], [address], [place], [place] [address]   Vitamin B-12 09/13/2022 585  180 - 914 pg/mL Final   Comment: (NOTE) This assay is not validated for testing neonatal  or myeloproliferative syndrome specimens for Vitamin B12 levels. Performed at  [facility], [address] [place], [place] [address]    Color, Urine 09/14/2022 YELLOW  YELLOW Final   APPearance 09/14/2022 HAZY (A)  CLEAR Final   Specific Gravity, Urine 09/14/2022 1.025  1.005 - 1.030 Final   pH 09/14/2022 5.0  5.0 - 8.0 Final   Glucose, UA 09/14/2022 NEGATIVE  NEGATIVE mg/dL Final   Hgb urine dipstick 09/14/2022 NEGATIVE  NEGATIVE Final   Bilirubin Urine 09/14/2022 NEGATIVE  NEGATIVE Final   Ketones, ur 09/14/2022 NEGATIVE  NEGATIVE mg/dL Final   Protein, ur 60/45/409812/11/2021 NEGATIVE  NEGATIVE mg/dL Final   Nitrite 11/91/478212/11/2021 NEGATIVE  NEGATIVE Final   Leukocytes,Ua 09/14/2022 TRACE (A)  NEGATIVE Final   RBC / HPF 09/14/2022 0-5  0 - 5 RBC/hpf Final   WBC, UA 09/14/2022 0-5  0 - 5 WBC/hpf Final   Bacteria, UA 09/14/2022 RARE (A)  NONE SEEN Final   Squamous Epithelial / HPF 09/14/2022 0-5  0 - 5 Final   Mucus 09/14/2022 PRESENT   Final   Performed at [facility], [address] [place], [place] [address]   Glucose-Capillary 09/16/2022 105 (H)  70 - 99 mg/dL Final   Glucose reference range applies only to samples taken after fasting for at least 8 hours.   SARS Coronavirus 2 by RT PCR 09/30/2022 NEGATIVE  NEGATIVE Final   Comment: (NOTE) SARS-CoV-2 target nucleic acids are NOT DETECTED.  The SARS-CoV-2 RNA is generally detectable in upper respiratory specimens during the acute phase of infection. The lowest concentration of SARS-CoV-2 viral copies this assay can detect is 138 copies/mL. A negative result does not preclude SARS-Cov-2 infection and should not be used as the sole basis for treatment or other patient management decisions. A negative result may occur with  improper specimen collection/handling, submission of specimen other than nasopharyngeal swab, presence of viral mutation(s) within the areas targeted by this assay, and inadequate number of viral copies(<138  copies/mL). A negative result must be combined with clinical observations, patient history, and epidemiological information. The expected result is Negative.  Fact Sheet for Patients:  BloggerCourse.comhttps://www.fda.gov/media/152166/download  Fact Sheet for Healthcare Providers:  SeriousBroker.ithttps://www.fda.gov/media/152162/download  This test is no                          t yet approved or cleared by the [place] FDA and  has been authorized for detection and/or diagnosis of SARS-CoV-2 by FDA under an Emergency Use Authorization (EUA). This EUA will remain  in effect (meaning this test can be used) for the duration of the COVID-19 declaration under Section 564(b)(1) of the Act, 21 U.S.C.section 360bbb-3(b)(1), unless the authorization is terminated  or revoked sooner.       Influenza A by PCR 09/30/2022 NEGATIVE  NEGATIVE Final   Influenza B by PCR 09/30/2022 NEGATIVE  NEGATIVE Final   Comment: (NOTE) The Xpert Xpress SARS-CoV-2/FLU/RSV plus assay is intended as an aid in the diagnosis of influenza from Nasopharyngeal swab specimens and should not be used as a sole basis for treatment. Nasal washings and aspirates are unacceptable for Xpert Xpress SARS-CoV-2/FLU/RSV testing.  Fact Sheet for Patients: BloggerCourse.comhttps://www.fda.gov/media/152166/download  Fact Sheet for Healthcare Providers: SeriousBroker.ithttps://www.fda.gov/media/152162/download  This test is not yet approved or cleared by the [place] FDA and has been authorized for detection and/or diagnosis of SARS-CoV-2 by FDA under an Emergency Use Authorization (EUA). This EUA will remain in effect (meaning this test can be used) for the duration of the COVID-19  declaration under Section 564(b)(1) of the Act, 21 U.S.C. section 360bbb-3(b)(1), unless the authorization is terminated or revoked.     Resp Syncytial Virus by PCR 09/30/2022 NEGATIVE  NEGATIVE Final   Comment: (NOTE) Fact Sheet for Patients: BloggerCourse.com  Fact  Sheet for Healthcare Providers: SeriousBroker.it  This test is not yet approved or cleared by the [place] FDA and has been authorized for detection and/or diagnosis of SARS-CoV-2 by FDA under an Emergency Use Authorization (EUA). This EUA will remain in effect (meaning this test can be used) for the duration of the COVID-19 declaration under Section 564(b)(1) of the Act, 21 U.S.C. section 360bbb-3(b)(1), unless the authorization is terminated or revoked.  Performed at [facility], [address], [place], [place] [address]    Sodium 10/01/2022 136  135 - 145 mmol/L Final   Potassium 10/01/2022 3.8  3.5 - 5.1 mmol/L Final   Chloride 10/01/2022 104  98 - 111 mmol/L Final   CO2 10/01/2022 27  22 - 32 mmol/L Final   Glucose, Bld 10/01/2022 98  70 - 99 mg/dL Final   Glucose reference range applies only to samples taken after fasting for at least 8 hours.   BUN 10/01/2022 12  6 - 20 mg/dL Final   Creatinine, Ser 10/01/2022 0.98  0.44 - 1.00 mg/dL Final   Calcium 67/34/1937 8.9  8.9 - 10.3 mg/dL Final   GFR, Estimated 10/01/2022 >60  >60 mL/min Final   Comment: (NOTE) Calculated using the CKD-EPI Creatinine Equation (2021)    Anion gap 10/01/2022 5  5 - 15 Final   Performed at [facility], [address] [place], [place] [address]   WBC 10/01/2022 5.1  4.0 - 10.5 K/uL Final   RBC 10/01/2022 4.31  3.87 - 5.11 MIL/uL Final   Hemoglobin 10/01/2022 12.7  12.0 - 15.0 g/dL Final   HCT 97/35/3299 38.8  36.0 - 46.0 % Final   MCV 10/01/2022 90.0  80.0 - 100.0 fL Final   MCH 10/01/2022 29.5  26.0 - 34.0 pg Final   MCHC 10/01/2022 32.7  30.0 - 36.0 g/dL Final   RDW 24/26/8341 12.4  11.5 - 15.5 % Final   Platelets 10/01/2022 300  150 - 400 K/uL Final   nRBC 10/01/2022 0.0  0.0 - 0.2 % Final   Performed at [facility], [address], [place], [place] [address]   Lipase 10/01/2022 29  11 - 51 U/L Final   Performed at [facility],  [address] [place], [place] [address]   Color, Urine 10/05/2022 YELLOW  YELLOW Final   APPearance 10/05/2022 HAZY (A)  CLEAR Final   Specific Gravity, Urine 10/05/2022 1.018  1.005 - 1.030 Final   pH 10/05/2022 5.0  5.0 - 8.0 Final   Glucose, UA 10/05/2022 NEGATIVE  NEGATIVE mg/dL Final   Hgb urine dipstick 10/05/2022 NEGATIVE  NEGATIVE Final   Bilirubin Urine 10/05/2022 NEGATIVE  NEGATIVE Final   Ketones, ur 10/05/2022 NEGATIVE  NEGATIVE mg/dL Final   Protein, ur 21/19/4174 NEGATIVE  NEGATIVE mg/dL Final   Nitrite 05/27/4817 NEGATIVE  NEGATIVE Final   Leukocytes,Ua 10/05/2022 TRACE (A)  NEGATIVE Final   RBC / HPF 10/05/2022 0-5  0 - 5 RBC/hpf Final   WBC, UA 10/05/2022 0-5  0 - 5 WBC/hpf Final   Bacteria, UA 10/05/2022 RARE (A)  NONE SEEN Final   Squamous Epithelial / HPF 10/05/2022 0-5  0 - 5 Final   Mucus 10/05/2022 PRESENT  Final   Performed at [facility], [address] [place], [place] [address]   Specimen Description 10/05/2022 URINE, CLEAN CATCH   Final   Special Requests 10/05/2022    Final                   Value:NONE Performed at [facility], [address] [place], [place] [address]    Culture 10/05/2022 10,000 COLONIES/mL MULTIPLE SPECIES PRESENT, SUGGEST RECOLLECTION (A)   Final   Report Status 10/05/2022 10/07/2022 FINAL   Final  Admission on 07/04/2022, Discharged on 07/05/2022  Component Date Value Ref Range Status   Sodium 07/04/2022 142  135 - 145 mmol/L Final   Potassium 07/04/2022 4.4  3.5 - 5.1 mmol/L Final   Chloride 07/04/2022 109  98 - 111 mmol/L Final   CO2 07/04/2022 26  22 - 32 mmol/L Final   Glucose, Bld 07/04/2022 96  70 - 99 mg/dL Final   Glucose reference range applies only to samples taken after fasting for at least 8 hours.   BUN 07/04/2022 15  6 - 20 mg/dL Final   Creatinine, Ser 07/04/2022 0.83  0.44 - 1.00 mg/dL Final   Calcium 82/95/621309/21/2023 9.3  8.9 - 10.3 mg/dL Final   Total Protein 08/65/784609/21/2023 7.2  6.5 - 8.1 g/dL Final    Albumin 96/29/528409/21/2023 3.9  3.5 - 5.0 g/dL Final   AST 13/24/401009/21/2023 23  15 - 41 U/L Final   ALT 07/04/2022 28  0 - 44 U/L Final   Alkaline Phosphatase 07/04/2022 77  38 - 126 U/L Final   Total Bilirubin 07/04/2022 0.4  0.3 - 1.2 mg/dL Final   GFR, Estimated 07/04/2022 >60  >60 mL/min Final   Comment: (NOTE) Calculated using the CKD-EPI Creatinine Equation (2021)    Anion gap 07/04/2022 7  5 - 15 Final   Performed at [facility], [address] [place], [place] [address]   Alcohol, Ethyl (B) 07/04/2022 <10  <10 mg/dL Final   Comment: (NOTE) Lowest detectable limit for serum alcohol is 10 mg/dL.  For medical purposes only. Performed at [facility], [address] [place], [place] [address]    WBC 07/04/2022 7.0  4.0 - 10.5 K/uL Final   RBC 07/04/2022 4.18  3.87 - 5.11 MIL/uL Final   Hemoglobin 07/04/2022 12.8  12.0 - 15.0 g/dL Final   HCT 34/74/259509/21/2023 39.1  36.0 - 46.0 % Final   MCV 07/04/2022 93.5  80.0 - 100.0 fL Final   MCH 07/04/2022 30.6  26.0 - 34.0 pg Final   MCHC 07/04/2022 32.7  30.0 - 36.0 g/dL Final   RDW 63/87/564309/21/2023 12.9  11.5 - 15.5 % Final   Platelets 07/04/2022 243  150 - 400 K/uL Final   nRBC 07/04/2022 0.0  0.0 - 0.2 % Final   Neutrophils Relative % 07/04/2022 43  % Final   Neutro Abs 07/04/2022 3.0  1.7 - 7.7 K/uL Final   Lymphocytes Relative 07/04/2022 50  % Final   Lymphs Abs 07/04/2022 3.5  0.7 - 4.0 K/uL Final   Monocytes Relative 07/04/2022 7  % Final   Monocytes Absolute 07/04/2022 0.5  0.1 - 1.0 K/uL Final   Eosinophils Relative 07/04/2022 0  % Final   Eosinophils Absolute 07/04/2022 0.0  0.0 - 0.5 K/uL Final   Basophils Relative 07/04/2022 0  % Final   Basophils Absolute 07/04/2022 0.0  0.0 - 0.1 K/uL Final   Immature Granulocytes 07/04/2022 0  %  Final   Abs Immature Granulocytes 07/04/2022 0.01  0.00 - 0.07 K/uL Final   Performed at [facility], [address], [place], [place] [address]   I-stat  hCG, quantitative 07/04/2022 <5.0  <5 mIU/mL Final   Comment 3 07/04/2022          Final   Comment:   GEST. AGE      CONC.  (mIU/mL)   <=1 WEEK        5 - 50     2 WEEKS       50 - 500     3 WEEKS       100 - 10,000     4 WEEKS     1,000 - 30,000        FEMALE AND NON-PREGNANT FEMALE:     LESS THAN 5 mIU/mL   Admission on 06/14/2022, Discharged on 06/14/2022  Component Date Value Ref Range Status   Color, UA 06/14/2022 yellow  yellow Final   Clarity, UA 06/14/2022 cloudy (A)  clear Final   Glucose, UA 06/14/2022 negative  negative mg/dL Final   Bilirubin, UA 62/13/0865 negative  negative Final   Ketones, POC UA 06/14/2022 negative  negative mg/dL Final   Spec Grav, UA 78/46/9629 1.025  1.010 - 1.025 Final   Blood, UA 06/14/2022 negative  negative Final   pH, UA 06/14/2022 7.5  5.0 - 8.0 Final   Protein Ur, POC 06/14/2022 =30 (A)  negative mg/dL Final   Urobilinogen, UA 06/14/2022 0.2  0.2 or 1.0 E.U./dL Final   Nitrite, UA 52/84/1324 Negative  Negative Final   Leukocytes, UA 06/14/2022 Negative  Negative Final   Preg Test, Ur 06/14/2022 Negative  Negative Final   Specimen Description 06/14/2022 URINE, CLEAN CATCH   Final   Special Requests 06/14/2022    Final                   Value:NONE Performed at [facility], [address], [place], [place] [address]    Culture 06/14/2022 MULTIPLE SPECIES PRESENT, SUGGEST RECOLLECTION (A)   Final   Report Status 06/14/2022 06/16/2022 FINAL   Final  Admission on 06/07/2022, Discharged on 06/10/2022  Component Date Value Ref Range Status   SARS Coronavirus 2 by RT PCR 06/07/2022 NEGATIVE  NEGATIVE Final   Comment: (NOTE) SARS-CoV-2 target nucleic acids are NOT DETECTED.  The SARS-CoV-2 RNA is generally detectable in upper respiratory specimens during the acute phase of infection. The lowest concentration of SARS-CoV-2 viral copies this assay can detect is 138 copies/mL. A negative result does not preclude SARS-Cov-2 infection and  should not be used as the sole basis for treatment or other patient management decisions. A negative result may occur with  improper specimen collection/handling, submission of specimen other than nasopharyngeal swab, presence of viral mutation(s) within the areas targeted by this assay, and inadequate number of viral copies(<138 copies/mL). A negative result must be combined with clinical observations, patient history, and epidemiological information. The expected result is Negative.  Fact Sheet for Patients:  BloggerCourse.com  Fact Sheet for Healthcare Providers:  SeriousBroker.it  This test is no                          t yet approved or cleared by the [place] FDA and  has been authorized for detection and/or diagnosis of SARS-CoV-2 by FDA under an Emergency Use Authorization (EUA). This EUA will remain  in effect (  meaning this test can be used) for the duration of the COVID-19 declaration under Section 564(b)(1) of the Act, 21 U.S.C.section 360bbb-3(b)(1), unless the authorization is terminated  or revoked sooner.       Influenza A by PCR 06/07/2022 NEGATIVE  NEGATIVE Final   Influenza B by PCR 06/07/2022 NEGATIVE  NEGATIVE Final   Comment: (NOTE) The Xpert Xpress SARS-CoV-2/FLU/RSV plus assay is intended as an aid in the diagnosis of influenza from Nasopharyngeal swab specimens and should not be used as a sole basis for treatment. Nasal washings and aspirates are unacceptable for Xpert Xpress SARS-CoV-2/FLU/RSV testing.  Fact Sheet for Patients: BloggerCourse.com  Fact Sheet for Healthcare Providers: SeriousBroker.it  This test is not yet approved or cleared by the [place] FDA and has been authorized for detection and/or diagnosis of SARS-CoV-2 by FDA under an Emergency Use Authorization (EUA). This EUA will remain in effect (meaning this test can be used)  for the duration of the COVID-19 declaration under Section 564(b)(1) of the Act, 21 U.S.C. section 360bbb-3(b)(1), unless the authorization is terminated or revoked.  Performed at [facility], [address] [place], [place] [address]    WBC 06/07/2022 5.7  4.0 - 10.5 K/uL Final   RBC 06/07/2022 4.39  3.87 - 5.11 MIL/uL Final   Hemoglobin 06/07/2022 13.2  12.0 - 15.0 g/dL Final   HCT 60/45/4098 40.4  36.0 - 46.0 % Final   MCV 06/07/2022 92.0  80.0 - 100.0 fL Final   MCH 06/07/2022 30.1  26.0 - 34.0 pg Final   MCHC 06/07/2022 32.7  30.0 - 36.0 g/dL Final   RDW 11/91/4782 12.4  11.5 - 15.5 % Final   Platelets 06/07/2022 308  150 - 400 K/uL Final   nRBC 06/07/2022 0.0  0.0 - 0.2 % Final   Neutrophils Relative % 06/07/2022 42  % Final   Neutro Abs 06/07/2022 2.4  1.7 - 7.7 K/uL Final   Lymphocytes Relative 06/07/2022 54  % Final   Lymphs Abs 06/07/2022 3.1  0.7 - 4.0 K/uL Final   Monocytes Relative 06/07/2022 4  % Final   Monocytes Absolute 06/07/2022 0.2  0.1 - 1.0 K/uL Final   Eosinophils Relative 06/07/2022 0  % Final   Eosinophils Absolute 06/07/2022 0.0  0.0 - 0.5 K/uL Final   Basophils Relative 06/07/2022 0  % Final   Basophils Absolute 06/07/2022 0.0  0.0 - 0.1 K/uL Final   Immature Granulocytes 06/07/2022 0  % Final   Abs Immature Granulocytes 06/07/2022 0.01  0.00 - 0.07 K/uL Final   Performed at [facility], [address], [place], [place] [address]   Sodium 06/07/2022 139  135 - 145 mmol/L Final   Potassium 06/07/2022 4.0  3.5 - 5.1 mmol/L Final   Chloride 06/07/2022 104  98 - 111 mmol/L Final   CO2 06/07/2022 28  22 - 32 mmol/L Final   Glucose, Bld 06/07/2022 104 (H)  70 - 99 mg/dL Final   Glucose reference range applies only to samples taken after fasting for at least 8 hours.   BUN 06/07/2022 8  6 - 20 mg/dL Final   Creatinine, Ser 06/07/2022 0.84  0.44 - 1.00 mg/dL Final   Calcium 30/86/5784 9.1  8.9 - 10.3 mg/dL Final   Total Protein 69/62/9528 6.9   6.5 - 8.1 g/dL Final   Albumin 41/32/4401 3.7  3.5 - 5.0 g/dL Final   AST 02/72/5366 19  15 - 41 U/L Final   ALT 06/07/2022  22  0 - 44 U/L Final   Alkaline Phosphatase 06/07/2022 54  38 - 126 U/L Final   Total Bilirubin 06/07/2022 0.4  0.3 - 1.2 mg/dL Final   GFR, Estimated 06/07/2022 >60  >60 mL/min Final   Comment: (NOTE) Calculated using the CKD-EPI Creatinine Equation (2021)    Anion gap 06/07/2022 7  5 - 15 Final   Performed at [facility], [address], [place], [place] [address]   Hgb A1c MFr Bld 06/07/2022 5.0  4.8 - 5.6 % Final   Comment: (NOTE) Pre diabetes:          5.7%-6.4%  Diabetes:              >6.4%  Glycemic control for   <7.0% adults with diabetes    Mean Plasma Glucose 06/07/2022 96.8  mg/dL Final   Performed at [facility], [address] [place], [place] [address]   TSH 06/07/2022 1.620  0.350 - 4.500 uIU/mL Final   Comment: Performed by a 3rd Generation assay with a functional sensitivity of <=0.01 uIU/mL. Performed at [facility], [address], [place], [place] [address]    RPR Ser Ql 06/07/2022 NON REACTIVE  NON REACTIVE Final   Performed at [facility], [address], [place], [place] [address]   Color, Urine 06/07/2022 YELLOW  YELLOW Final   APPearance 06/07/2022 HAZY (A)  CLEAR Final   Specific Gravity, Urine 06/07/2022 1.018  1.005 - 1.030 Final   pH 06/07/2022 7.0  5.0 - 8.0 Final   Glucose, UA 06/07/2022 NEGATIVE  NEGATIVE mg/dL Final   Hgb urine dipstick 06/07/2022 NEGATIVE  NEGATIVE Final   Bilirubin Urine 06/07/2022 NEGATIVE  NEGATIVE Final   Ketones, ur 06/07/2022 NEGATIVE  NEGATIVE mg/dL Final   Protein, ur 65/78/4696 NEGATIVE  NEGATIVE mg/dL Final   Nitrite 29/52/8413 NEGATIVE  NEGATIVE Final   Leukocytes,Ua 06/07/2022 NEGATIVE  NEGATIVE Final   Performed at [facility], [address] [place], [place] [address]   Cholesterol 06/07/2022 164  0 - 200 mg/dL Final   Triglycerides 02/72/5366 158 (H)   <150 mg/dL Final   HDL 44/12/4740 44  >40 mg/dL Final   Total CHOL/HDL Ratio 06/07/2022 3.7  RATIO Final   VLDL 06/07/2022 32  0 - 40 mg/dL Final   LDL Cholesterol 06/07/2022 88  0 - 99 mg/dL Final   Comment:        Total Cholesterol/HDL:CHD Risk Coronary Heart Disease Risk Table                     Men   Women  1/2 Average Risk   3.4   3.3  Average Risk       5.0   4.4  2 X Average Risk   9.6   7.1  3 X Average Risk  23.4   11.0        Use the calculated Patient Ratio above and the CHD Risk Table to determine the patient's CHD Risk.        ATP III CLASSIFICATION (LDL):  <100     mg/dL   Optimal  595-638  mg/dL   Near or Above                    Optimal  130-159  mg/dL   Borderline  756-433  mg/dL   High  >295     mg/dL   Very High Performed at  [facility], [address], [place], [place] [address]    HIV Screen 4th Generation wRfx 06/07/2022 Non Reactive  Non Reactive Final   Performed at [facility], [address] [place], [place] [address]   SARSCOV2ONAVIRUS 2 AG 06/07/2022 NEGATIVE  NEGATIVE Final   Comment: (NOTE) SARS-CoV-2 antigen NOT DETECTED.   Negative results are presumptive.  Negative results do not preclude SARS-CoV-2 infection and should not be used as the sole basis for treatment or other patient management decisions, including infection  control decisions, particularly in the presence of clinical signs and  symptoms consistent with COVID-19, or in those who have been in contact with the virus.  Negative results must be combined with clinical observations, patient history, and epidemiological information. The expected result is Negative.  Fact Sheet for Patients: https://www.jennings-kim.com/  Fact Sheet for Healthcare Providers: https://alexander-rogers.biz/  This test is not yet approved or cleared by the [place] FDA and  has been authorized for detection and/or diagnosis of SARS-CoV-2 by FDA under an  Emergency Use Authorization (EUA).  This EUA will remain in effect (meaning this test can be used) for the duration of  the COV                          ID-19 declaration under Section 564(b)(1) of the Act, 21 U.S.C. section 360bbb-3(b)(1), unless the authorization is terminated or revoked sooner.     POC Amphetamine UR 06/07/2022 None Detected  NONE DETECTED (Cut Off Level 1000 ng/mL) Final   POC Secobarbital (BAR) 06/07/2022 None Detected  NONE DETECTED (Cut Off Level 300 ng/mL) Final   POC Buprenorphine (BUP) 06/07/2022 None Detected  NONE DETECTED (Cut Off Level 10 ng/mL) Final   POC Oxazepam (BZO) 06/07/2022 None Detected  NONE DETECTED (Cut Off Level 300 ng/mL) Final   POC Cocaine UR 06/07/2022 None Detected  NONE DETECTED (Cut Off Level 300 ng/mL) Final   POC Methamphetamine UR 06/07/2022 None Detected  NONE DETECTED (Cut Off Level 1000 ng/mL) Final   POC Morphine 06/07/2022 None Detected  NONE DETECTED (Cut Off Level 300 ng/mL) Final   POC Methadone UR 06/07/2022 None Detected  NONE DETECTED (Cut Off Level 300 ng/mL) Final   POC Oxycodone UR 06/07/2022 None Detected  NONE DETECTED (Cut Off Level 100 ng/mL) Final   POC Marijuana UR 06/07/2022 None Detected  NONE DETECTED (Cut Off Level 50 ng/mL) Final   Preg Test, Ur 06/08/2022 NEGATIVE  NEGATIVE Final   Comment:        THE SENSITIVITY OF THIS METHODOLOGY IS >20 mIU/mL. Performed at [facility], [address] [place], [place] [address]    Preg Test, Ur 06/08/2022 NEGATIVE  NEGATIVE Final   Comment:        THE SENSITIVITY OF THIS METHODOLOGY IS >24 mIU/mL     Blood Alcohol level:  Lab Results  Component Value Date   ETH <10 07/04/2022   ETH <10 02/07/2022    Metabolic Disorder Labs: Lab Results  Component Value Date   HGBA1C 5.7 (H) 10/15/2022   MPG 117 10/15/2022   MPG 96.8 06/07/2022   No results found for: "PROLACTIN" Lab Results  Component Value Date   CHOL 181 07/25/2022   TRIG 118 07/25/2022    HDL 45 07/25/2022   CHOLHDL 4.0 07/25/2022   VLDL 24 07/25/2022   LDLCALC 112 (H) 07/25/2022   LDLCALC 88 06/07/2022    Therapeutic Lab Levels: No results  found for: "LITHIUM" No results found for: "VALPROATE" No results found for: "CBMZ"  Physical Findings   GAD-7    Flowsheet Row [facility]17/2021 [facility]  Total GAD-7 Score 17      PHQ2-9    Flowsheet Row ED from 06/07/2022 in [facility] Office Visit from 12/17/2021 in [facility]  PHQ-2 Total Score 1 2  PHQ-9 Total Score 2 8      Flowsheet Row ED from 10/07/2022 in [facility] Most recent reading at 10/20/2022 11:04 AM ED from 10/07/2022 in [facility] EMERGENCY DEPARTMENT Most recent reading at 10/07/2022 12:18 PM ED from 07/25/2022 in [facility] Most recent reading at 10/06/2022  9:38 AM  C-SSRS RISK CATEGORY No Risk No Risk No Risk        Musculoskeletal  Strength & Muscle Tone: within normal limits Gait & Station: normal Patient leans: N/A  Psychiatric Specialty Exam  Presentation  General Appearance:  Appropriate for Environment; Casual  Eye Contact: Good  Speech: Clear and Coherent; Normal Rate  Speech Volume: Normal  Handedness: Right   Mood and Affect  Mood: Euthymic  Affect: Congruent   Thought Process  Thought Processes: Coherent  Descriptions of Associations:Intact  Orientation:Full (Time, Place and Person)  Thought Content:Logical  Diagnosis of Schizophrenia or Schizoaffective disorder in past: No    Hallucinations:Hallucinations: None  Ideas of Reference:None  Suicidal Thoughts:No data recorded Homicidal Thoughts:Homicidal Thoughts: No   Sensorium  Memory: Immediate Good; Recent Good; Remote Good  Judgment: Good  Insight: Good   Executive Functions  Concentration: Good  Attention  Span: Good  Recall: Good  Fund of Knowledge: Good  Language: Good   Psychomotor Activity  Psychomotor Activity: Psychomotor Activity: Normal   Assets  Assets: Communication Skills; Desire for Improvement; Leisure Time; Physical Health; Resilience; Social Support   Sleep  Sleep: Sleep: Good   No data recorded  Physical Exam  Physical Exam Vitals and nursing note reviewed.  Constitutional:      General: She is not in acute distress.    Appearance: Normal appearance. She is not ill-appearing.  HENT:     Head: Normocephalic.  Eyes:     Conjunctiva/sclera: Conjunctivae normal.  Cardiovascular:     Rate and Rhythm: Normal rate and regular rhythm.  Pulmonary:     Effort: Pulmonary effort is normal.     Breath sounds: Normal breath sounds.  Musculoskeletal:        General: Normal range of motion.     Cervical back: Normal range of motion.  Skin:    General: Skin is warm and dry.  Neurological:     Mental Status: She is alert and oriented to person, place, and time.  Psychiatric:        Attention and Perception: Attention and perception normal. She does not perceive auditory or visual hallucinations.        Mood and Affect: Mood and affect normal.        Speech: Speech normal.        Behavior: Behavior normal. Behavior is cooperative.        Thought Content: Thought content normal. Thought content is not paranoid. Thought content does not include homicidal or suicidal ideation.    Review of Systems  Constitutional: Negative.   HENT:  Negative for ear discharge, ear pain, sinus pain, sore throat and tinnitus.   Eyes: Negative.  Respiratory:  Negative for cough, shortness of breath and wheezing.   Cardiovascular:  Negative for chest pain and palpitations.  Gastrointestinal: Negative.   Genitourinary: Negative.   Musculoskeletal: Negative.   Skin: Negative.   Neurological: Negative.   Endo/Heme/Allergies: Negative.   Psychiatric/Behavioral:  Negative for  hallucinations, substance abuse and suicidal ideas. Depression: Stable.The patient does not have insomnia. Nervous/anxious: Stable.   Blood pressure 109/70, pulse 93, temperature 98 F (36.7 C), temperature source Oral, resp. rate 16, height 5\' 1"  (1.549 m), weight 198 lb (89.8 kg), SpO2 99 %. Body mass index is 37.41 kg/m.  Treatment Plan Summary: Plan Patient continues to be psychiatrically cleared, stable and ready for discharge.  Social work/TOC, DSS to continue seeking appropriate placement for patient.    Sybrina Laning, NP 10/21/2022 11:37 AM

## 2022-10-21 NOTE — Care Management (Signed)
Error in charting.

## 2022-10-21 NOTE — ED Notes (Signed)
Pt resting quietly, breathing is even and unlabored.  Pt denies SI, HI, pain and AVH.  Will continue to monitor for safety.  

## 2022-10-21 NOTE — ED Notes (Signed)
Pt sleeping@this time. Breathing even and unlabored. Will continue to monitor for safety 

## 2022-10-21 NOTE — ED Notes (Signed)
Patient observed/assessed in bed/chair resting quietly appearing in no distress and verbalizing no complaints at this time. Will continue to monitor.  

## 2022-10-21 NOTE — ED Notes (Signed)
Pt watching television. Pt cal and cooperative. No c/o pain to distress. Will continue to monitor for safety

## 2022-10-22 DIAGNOSIS — Z1152 Encounter for screening for COVID-19: Secondary | ICD-10-CM | POA: Diagnosis not present

## 2022-10-22 DIAGNOSIS — F333 Major depressive disorder, recurrent, severe with psychotic symptoms: Secondary | ICD-10-CM | POA: Diagnosis not present

## 2022-10-22 DIAGNOSIS — N9489 Other specified conditions associated with female genital organs and menstrual cycle: Secondary | ICD-10-CM | POA: Diagnosis not present

## 2022-10-22 DIAGNOSIS — E118 Type 2 diabetes mellitus with unspecified complications: Secondary | ICD-10-CM | POA: Diagnosis not present

## 2022-10-22 DIAGNOSIS — F17200 Nicotine dependence, unspecified, uncomplicated: Secondary | ICD-10-CM | POA: Diagnosis not present

## 2022-10-22 DIAGNOSIS — F209 Schizophrenia, unspecified: Secondary | ICD-10-CM | POA: Diagnosis not present

## 2022-10-22 DIAGNOSIS — F419 Anxiety disorder, unspecified: Secondary | ICD-10-CM | POA: Diagnosis not present

## 2022-10-22 DIAGNOSIS — N390 Urinary tract infection, site not specified: Secondary | ICD-10-CM | POA: Diagnosis not present

## 2022-10-22 DIAGNOSIS — E039 Hypothyroidism, unspecified: Secondary | ICD-10-CM | POA: Diagnosis not present

## 2022-10-22 NOTE — ED Provider Notes (Signed)
Behavioral Health Progress Note  Date and Time: 10/22/2022 11:29 AM Name: Nicole [name] MRN:  [id number]  HPI [name] 32 y.o., female patient with schizophrenia, MDD with psychosis,  presented to [facility], voluntarily, as a walk-in accompanied by [employer] after a disagreement and verbal altercation with personnel at the group home.  She has been dismissed previously from other group homes due to threatening behaviors, and suicidal ideation and the one which she was residing in.  Patient was admitted to continuous assessment unit on 07/25/2022 as she also endorsed passive SI at that time along with symptoms of visual hallucination. Patient was subsequently psychiatrically cleared during her admission to the continuous assessment unit.  However patient remains here at [facility] until adequate- residential placement can be obtained.    Legal Guardian: [name]   Tests administered: Wechsler Adult Intelligence Scale-4 (see Jolene Provost, PhD consult note on 08/19/2022 for full info) [place]-3 Adaptive Behavior Scales Assessment completed on 09/13/2022 administered by [name], MD  Subjective:   Patient was initially seen sitting up eating breakfast, no acute distress. Patient was pleasant, calm, engaged with evaluation. Patient denied any new or acute concerns. Reported that her ear pain has improved. Denied issues with constipation. She denied side effects to current medications. She denied feeling depressed or anxious. Her sleep and appetite are stable and appropriate. She denied SI/HI/AVH, paranoia, contracted to safety on the unit. Patient had no other questions or concerns and was amenable to plan per below.    Diagnosis:  Final diagnoses:  Tobacco use disorder  Schizophrenia, unspecified type (HCC)  MDD (major depressive disorder), recurrent episode, moderate (HCC)  Hypothyroidism, unspecified  type    Total Time spent with patient: 15 minutes  Past Psychiatric History: schizoaffective, borderline IDD, anxiety, and MDD  Past Medical History:  Past Medical History:  Diagnosis Date   Anxiety    Depression    Hypothyroidism 08/07/2022   Tobacco use disorder 08/07/2022    Past Surgical History:  Procedure Laterality Date   WISDOM TOOTH EXTRACTION Bilateral 2020   Family History:  Family History  Problem Relation Age of Onset   Hypertension Father    Diabetes Father    Family Psychiatric  History: None reported Social History:  Social History   Substance and Sexual Activity  Alcohol Use Never     Social History   Substance and Sexual Activity  Drug Use Never    Social History   Socioeconomic History   Marital status: Single    Spouse name: Not on file   Number of children: Not on file   Years of education: Not on file   Highest education level: Not on file  Occupational History   Not on file  Tobacco Use   Smoking status: Every Day    Types: Cigarettes   Smokeless tobacco: Never  Vaping Use   Vaping Use: Some days  Substance and Sexual Activity   Alcohol use: Never   Drug use: Never   Sexual activity: Yes    Partners: Female    Birth control/protection: Implant  Other Topics Concern   Not on file  Social History Narrative   Not on file   Social Determinants of Health   Financial Resource Strain: Not on file  Food Insecurity: Not on file  Transportation Needs: Not on file  Physical Activity: Not on file  Stress: Not on file  Social Connections: Not  on file   SDOH:  SDOH Screenings   Depression (PHQ2-9): Low Risk  (06/10/2022)  Tobacco Use: High Risk (10/21/2022)   Additional Social History:     Sleep: Good  Appetite:  Good  Current Medications:  Current Facility-Administered Medications  Medication Dose Route Frequency Provider Last Rate Last Admin   acetaminophen (TYLENOL) tablet 650 mg  650 mg Oral Q6H PRN Marlou SaByungura, Veronique  M, NP       alum & mag hydroxide-simeth (MAALOX/MYLANTA) 200-200-20 MG/5ML suspension 30 mL  30 mL Oral Q4H PRN Rayburn GoByungura, Veronique M, NP   30 mL at 10/20/22 2201   cetaphil lotion   Topical PRN Princess BruinsNguyen, Neville Walston, DO       fluticasone [facility](FLONASE) 50 MCG/ACT nasal spray 1 spray  1 spray Each Nare QHS Princess BruinsNguyen, Milad Bublitz, DO   1 spray at 10/21/22 2031   hydrOXYzine (ATARAX) tablet 25 mg  25 mg Oral TID PRN Lorri Frederickarrion-Carrero, Margely, MD   25 mg at 10/21/22 2033   levothyroxine (SYNTHROID) tablet 100 mcg  100 mcg Oral Once Byungura, Veronique M, NP       levothyroxine (SYNTHROID) tablet 100 mcg  100 mcg Oral Q0600 Marlou SaByungura, Veronique M, NP   100 mcg at 10/22/22 16100621   loratadine (CLARITIN) tablet 10 mg  10 mg Oral Daily Carrion-Carrero, Karle StarchMargely, MD   10 mg at 10/22/22 0946   magnesium hydroxide (MILK OF MAGNESIA) suspension 30 mL  30 mL Oral Daily PRN Marlou SaByungura, Veronique M, NP   30 mL at 10/12/22 2145   metFORMIN (GLUCOPHAGE) tablet 500 mg  500 mg Oral Q breakfast Carrion-Carrero, Margely, MD   500 mg at 10/22/22 0844   nicotine (NICODERM CQ - dosed in mg/24 hours) patch 14 mg  14 mg Transdermal Daily PRN Carrion-Carrero, Karle StarchMargely, MD       ondansetron (ZOFRAN-ODT) disintegrating tablet 4 mg  4 mg Oral Q8H PRN Carrion-Carrero, Margely, MD   4 mg at 10/21/22 1931   Oxcarbazepine (TRILEPTAL) tablet 300 mg  300 mg Oral BID Rayburn GoByungura, Veronique M, NP   300 mg at 10/22/22 0946   pantoprazole (PROTONIX) EC tablet 40 mg  40 mg Oral Daily Carrion-Carrero, Karle StarchMargely, MD   40 mg at 10/22/22 0946   polyethylene glycol (MIRALAX / GLYCOLAX) packet 17 g  17 g Oral Daily Princess BruinsNguyen, Senya Hinzman, DO   17 g at 10/22/22 0946   QUEtiapine (SEROQUEL) tablet 400 mg  400 mg Oral BID Olin PiaByungura, Veronique M, NP   400 mg at 10/22/22 0946   sertraline (ZOLOFT) tablet 150 mg  150 mg Oral Daily Carrion-Carrero, Margely, MD   150 mg at 10/22/22 0946   sodium chloride (OCEAN) 0.65 % nasal spray 1 spray  1 spray Each Nare Daily Princess BruinsNguyen, Garrett Mitchum, DO   1 spray at  10/22/22 0946   traZODone (DESYREL) tablet 100 mg  100 mg Oral QHS Olin PiaByungura, Veronique M, NP   100 mg at 10/21/22 2033   traZODone (DESYREL) tablet 50 mg  50 mg Oral QHS PRN Marlou SaByungura, Veronique M, NP   50 mg at 10/21/22 2202   valACYclovir (VALTREX) tablet 500 mg  500 mg Oral Daily Carrion-Carrero, Karle StarchMargely, MD   500 mg at 10/22/22 0946   Current Outpatient Medications  Medication Sig Dispense Refill   ABILIFY MAINTENA 400 MG PRSY prefilled syringe 400 mg every 28 (twenty-eight) days.     cetirizine (ZYRTEC) 10 MG tablet Take 10 mg by mouth daily.     cyclobenzaprine (FLEXERIL) 10 MG tablet Take 1 tablet (10  mg total) by mouth 2 (two) times daily as needed for muscle spasms. 20 tablet 0   fluticasone (FLONASE) 50 MCG/ACT nasal spray Place 1 spray into both nostrils daily.     meloxicam (MOBIC) 15 MG tablet Take 15 mg by mouth daily.     nitrofurantoin, macrocrystal-monohydrate, (MACROBID) 100 MG capsule Take 1 capsule (100 mg total) by mouth 2 (two) times daily. 10 capsule 0   ondansetron (ZOFRAN-ODT) 4 MG disintegrating tablet Take 1 tablet (4 mg total) by mouth every 8 (eight) hours as needed for nausea or vomiting. 20 tablet 0   Oxcarbazepine (TRILEPTAL) 300 MG tablet Take 1 tablet (300 mg total) by mouth 2 (two) times daily. 60 tablet 0   QUEtiapine (SEROQUEL) 400 MG tablet Take 1 tablet (400 mg total) by mouth 2 (two) times daily. 60 tablet 0   sertraline (ZOLOFT) 50 MG tablet Take 3 tablets (150 mg total) by mouth in the morning. 90 tablet 0   traZODone (DESYREL) 100 MG tablet Take 1 tablet (100 mg total) by mouth at bedtime. 30 tablet 0   valACYclovir (VALTREX) 500 MG tablet Take 500 mg by mouth daily.      Labs  Lab Results:  Admission on 10/07/2022  Component Date Value Ref Range Status   Hgb A1c MFr Bld 10/15/2022 5.7 (H)  4.8 - 5.6 % Final   Comment: (NOTE)         Prediabetes: 5.7 - 6.4         Diabetes: >6.4         Glycemic control for adults with diabetes: <7.0    Mean  Plasma Glucose 10/15/2022 117  mg/dL Final   Comment: (NOTE) Performed At: [facility] [place], [place] [id number] Rush Farmer MD KG:[id number]   Admission on 10/07/2022, Discharged on 10/07/2022  Component Date Value Ref Range Status   Color, Urine 10/07/2022 YELLOW  YELLOW Final   APPearance 10/07/2022 HAZY (A)  CLEAR Final   Specific Gravity, Urine 10/07/2022 1.011  1.005 - 1.030 Final   pH 10/07/2022 7.0  5.0 - 8.0 Final   Glucose, UA 10/07/2022 NEGATIVE  NEGATIVE mg/dL Final   Hgb urine dipstick 10/07/2022 SMALL (A)  NEGATIVE Final   Bilirubin Urine 10/07/2022 NEGATIVE  NEGATIVE Final   Ketones, ur 10/07/2022 NEGATIVE  NEGATIVE mg/dL Final   Protein, ur 10/07/2022 NEGATIVE  NEGATIVE mg/dL Final   Nitrite 10/07/2022 NEGATIVE  NEGATIVE Final   Leukocytes,Ua 10/07/2022 LARGE (A)  NEGATIVE Final   RBC / HPF 10/07/2022 0-5  0 - 5 RBC/hpf Final   WBC, UA 10/07/2022 0-5  0 - 5 WBC/hpf Final   Bacteria, UA 10/07/2022 FEW (A)  NONE SEEN Final   Squamous Epithelial / HPF 10/07/2022 11-20  0 - 5 Final   Performed at [facility], [address], [place], [place] [address]   I-stat hCG, quantitative 10/07/2022 <5.0  <5 mIU/mL Final   Comment 3 10/07/2022          Final   Comment:   GEST. AGE      CONC.  (mIU/mL)   <=1 WEEK        5 - 50     2 WEEKS       50 - 500     3 WEEKS       100 - 10,000     4 WEEKS     1,000 - 30,000        FEMALE AND NON-PREGNANT FEMALE:  LESS THAN 5 mIU/mL    Sodium 10/07/2022 138  135 - 145 mmol/L Final   Potassium 10/07/2022 4.3  3.5 - 5.1 mmol/L Final   Chloride 10/07/2022 104  98 - 111 mmol/L Final   CO2 10/07/2022 28  22 - 32 mmol/L Final   Glucose, Bld 10/07/2022 90  70 - 99 mg/dL Final   Glucose reference range applies only to samples taken after fasting for at least 8 hours.   BUN 10/07/2022 8  6 - 20 mg/dL Final   Creatinine, Ser 10/07/2022 0.96  0.44 - 1.00 mg/dL Final   Calcium 16/10/960412/25/2023 9.1  8.9 - 10.3 mg/dL  Final   GFR, Estimated 10/07/2022 >60  >60 mL/min Final   Comment: (NOTE) Calculated using the CKD-EPI Creatinine Equation (2021)    Anion gap 10/07/2022 6  5 - 15 Final   Performed at [facility], [address] [place], [place] [address]   WBC 10/07/2022 5.8  4.0 - 10.5 K/uL Final   RBC 10/07/2022 4.36  3.87 - 5.11 MIL/uL Final   Hemoglobin 10/07/2022 12.9  12.0 - 15.0 g/dL Final   HCT 11/91/478212/25/2023 40.0  36.0 - 46.0 % Final   MCV 10/07/2022 91.7  80.0 - 100.0 fL Final   MCH 10/07/2022 29.6  26.0 - 34.0 pg Final   MCHC 10/07/2022 32.3  30.0 - 36.0 g/dL Final   RDW 95/62/130812/25/2023 12.4  11.5 - 15.5 % Final   Platelets 10/07/2022 295  150 - 400 K/uL Final   nRBC 10/07/2022 0.0  0.0 - 0.2 % Final   Performed at [facility], [address] [place], [place] [address]   SARS Coronavirus 2 by RT PCR 10/07/2022 NEGATIVE  NEGATIVE Final   Comment: (NOTE) SARS-CoV-2 target nucleic acids are NOT DETECTED.  The SARS-CoV-2 RNA is generally detectable in upper respiratory specimens during the acute phase of infection. The lowest concentration of SARS-CoV-2 viral copies this assay can detect is 138 copies/mL. A negative result does not preclude SARS-Cov-2 infection and should not be used as the sole basis for treatment or other patient management decisions. A negative result may occur with  improper specimen collection/handling, submission of specimen other than nasopharyngeal swab, presence of viral mutation(s) within the areas targeted by this assay, and inadequate number of viral copies(<138 copies/mL). A negative result must be combined with clinical observations, patient history, and epidemiological information. The expected result is Negative.  Fact Sheet for Patients:  BloggerCourse.comhttps://www.fda.gov/media/152166/download  Fact Sheet for Healthcare Providers:  SeriousBroker.ithttps://www.fda.gov/media/152162/download  This test is no                          t yet approved or cleared by the [place] FDA and  has been authorized for detection and/or diagnosis of SARS-CoV-2 by FDA under an Emergency Use Authorization (EUA). This EUA will remain  in effect (meaning this test can be used) for the duration of the COVID-19 declaration under Section 564(b)(1) of the Act, 21 U.S.C.section 360bbb-3(b)(1), unless the authorization is terminated  or revoked sooner.       Influenza A by PCR 10/07/2022 NEGATIVE  NEGATIVE Final   Influenza B by PCR 10/07/2022 NEGATIVE  NEGATIVE Final   Comment: (NOTE) The Xpert Xpress SARS-CoV-2/FLU/RSV plus assay is intended as an aid in the diagnosis of influenza from Nasopharyngeal swab specimens and should not be used as a sole basis for treatment. Nasal washings and aspirates are unacceptable for  Xpert Xpress SARS-CoV-2/FLU/RSV testing.  Fact Sheet for Patients: BloggerCourse.com  Fact Sheet for Healthcare Providers: SeriousBroker.it  This test is not yet approved or cleared by the [place] FDA and has been authorized for detection and/or diagnosis of SARS-CoV-2 by FDA under an Emergency Use Authorization (EUA). This EUA will remain in effect (meaning this test can be used) for the duration of the COVID-19 declaration under Section 564(b)(1) of the Act, 21 U.S.C. section 360bbb-3(b)(1), unless the authorization is terminated or revoked.     Resp Syncytial Virus by PCR 10/07/2022 NEGATIVE  NEGATIVE Final   Comment: (NOTE) Fact Sheet for Patients: BloggerCourse.com  Fact Sheet for Healthcare Providers: SeriousBroker.it  This test is not yet approved or cleared by the [place] FDA and has been authorized for detection and/or diagnosis of SARS-CoV-2 by FDA under an Emergency Use Authorization (EUA). This EUA will remain in effect (meaning this test can be used) for the duration of the COVID-19 declaration under Section 564(b)(1) of  the Act, 21 U.S.C. section 360bbb-3(b)(1), unless the authorization is terminated or revoked.  Performed at [facility], [address] [place], [place] [address]    Specimen Description 10/07/2022 URINE, CLEAN CATCH   Final   Special Requests 10/07/2022    Final                   Value:NONE Performed at [facility], [address] [place], [place] [address]    Culture 10/07/2022 MULTIPLE SPECIES PRESENT, SUGGEST RECOLLECTION (A)   Final   Report Status 10/07/2022 10/08/2022 FINAL   Final  Admission on 07/25/2022, Discharged on 10/07/2022  Component Date Value Ref Range Status   SARS Coronavirus 2 by RT PCR 07/25/2022 NEGATIVE  NEGATIVE Final   Comment: (NOTE) SARS-CoV-2 target nucleic acids are NOT DETECTED.  The SARS-CoV-2 RNA is generally detectable in upper respiratory specimens during the acute phase of infection. The lowest concentration of SARS-CoV-2 viral copies this assay can detect is 138 copies/mL. A negative result does not preclude SARS-Cov-2 infection and should not be used as the sole basis for treatment or other patient management decisions. A negative result may occur with  improper specimen collection/handling, submission of specimen other than nasopharyngeal swab, presence of viral mutation(s) within the areas targeted by this assay, and inadequate number of viral copies(<138 copies/mL). A negative result must be combined with clinical observations, patient history, and epidemiological information. The expected result is Negative.  Fact Sheet for Patients:  BloggerCourse.com  Fact Sheet for Healthcare Providers:  SeriousBroker.it  This test is no                          t yet approved or cleared by the [place] FDA and  has been authorized for detection and/or diagnosis of SARS-CoV-2 by FDA under an Emergency Use Authorization (EUA). This EUA will remain  in effect (meaning this test  can be used) for the duration of the COVID-19 declaration under Section 564(b)(1) of the Act, 21 U.S.C.section 360bbb-3(b)(1), unless the authorization is terminated  or revoked sooner.       Influenza A by PCR 07/25/2022 NEGATIVE  NEGATIVE Final   Influenza B by PCR 07/25/2022 NEGATIVE  NEGATIVE Final   Comment: (NOTE) The Xpert Xpress SARS-CoV-2/FLU/RSV plus assay is intended as an aid in the diagnosis of influenza from Nasopharyngeal swab specimens and should not be used as a sole basis for treatment. Nasal washings and aspirates  are unacceptable for Xpert Xpress SARS-CoV-2/FLU/RSV testing.  Fact Sheet for Patients: BloggerCourse.com  Fact Sheet for Healthcare Providers: SeriousBroker.it  This test is not yet approved or cleared by the [place] FDA and has been authorized for detection and/or diagnosis of SARS-CoV-2 by FDA under an Emergency Use Authorization (EUA). This EUA will remain in effect (meaning this test can be used) for the duration of the COVID-19 declaration under Section 564(b)(1) of the Act, 21 U.S.C. section 360bbb-3(b)(1), unless the authorization is terminated or revoked.  Performed at [facility], [address] [place], [place] [address]    WBC 07/25/2022 8.3  4.0 - 10.5 K/uL Final   RBC 07/25/2022 4.44  3.87 - 5.11 MIL/uL Final   Hemoglobin 07/25/2022 13.7  12.0 - 15.0 g/dL Final   HCT 60/45/4098 40.2  36.0 - 46.0 % Final   MCV 07/25/2022 90.5  80.0 - 100.0 fL Final   MCH 07/25/2022 30.9  26.0 - 34.0 pg Final   MCHC 07/25/2022 34.1  30.0 - 36.0 g/dL Final   RDW 11/91/4782 12.2  11.5 - 15.5 % Final   Platelets 07/25/2022 248  150 - 400 K/uL Final   nRBC 07/25/2022 0.0  0.0 - 0.2 % Final   Neutrophils Relative % 07/25/2022 43  % Final   Neutro Abs 07/25/2022 3.6  1.7 - 7.7 K/uL Final   Lymphocytes Relative 07/25/2022 52  % Final   Lymphs Abs 07/25/2022 4.2 (H)  0.7 - 4.0 K/uL Final    Monocytes Relative 07/25/2022 5  % Final   Monocytes Absolute 07/25/2022 0.4  0.1 - 1.0 K/uL Final   Eosinophils Relative 07/25/2022 0  % Final   Eosinophils Absolute 07/25/2022 0.0  0.0 - 0.5 K/uL Final   Basophils Relative 07/25/2022 0  % Final   Basophils Absolute 07/25/2022 0.0  0.0 - 0.1 K/uL Final   Immature Granulocytes 07/25/2022 0  % Final   Abs Immature Granulocytes 07/25/2022 0.02  0.00 - 0.07 K/uL Final   Performed at [facility], [address] [place], [place] [address]   Sodium 07/25/2022 138  135 - 145 mmol/L Final   Potassium 07/25/2022 4.0  3.5 - 5.1 mmol/L Final   Chloride 07/25/2022 104  98 - 111 mmol/L Final   CO2 07/25/2022 29  22 - 32 mmol/L Final   Glucose, Bld 07/25/2022 83  70 - 99 mg/dL Final   Glucose reference range applies only to samples taken after fasting for at least 8 hours.   BUN 07/25/2022 11  6 - 20 mg/dL Final   Creatinine, Ser 07/25/2022 0.97  0.44 - 1.00 mg/dL Final   Calcium 30/86/5784 9.2  8.9 - 10.3 mg/dL Final   Total Protein 69/62/9528 7.0  6.5 - 8.1 g/dL Final   Albumin 41/32/4401 3.8  3.5 - 5.0 g/dL Final   AST 02/72/5366 18  15 - 41 U/L Final   ALT 07/25/2022 22  0 - 44 U/L Final   Alkaline Phosphatase 07/25/2022 64  38 - 126 U/L Final   Total Bilirubin 07/25/2022 0.2 (L)  0.3 - 1.2 mg/dL Final   GFR, Estimated 07/25/2022 >60  >60 mL/min Final   Comment: (NOTE) Calculated using the CKD-EPI Creatinine Equation (2021)    Anion gap 07/25/2022 5  5 - 15 Final   Performed at [facility], [address] [place], [place] [address]   POC Amphetamine UR 07/25/2022 None Detected  NONE DETECTED (Cut Off Level 1000 ng/mL) Preliminary  POC Secobarbital (BAR) 07/25/2022 None Detected  NONE DETECTED (Cut Off Level 300 ng/mL) Preliminary   POC Buprenorphine (BUP) 07/25/2022 None Detected  NONE DETECTED (Cut Off Level 10 ng/mL) Preliminary   POC Oxazepam (BZO) 07/25/2022 None Detected  NONE DETECTED (Cut Off Level 300 ng/mL)  Preliminary   POC Cocaine UR 07/25/2022 None Detected  NONE DETECTED (Cut Off Level 300 ng/mL) Preliminary   POC Methamphetamine UR 07/25/2022 None Detected  NONE DETECTED (Cut Off Level 1000 ng/mL) Preliminary   POC Morphine 07/25/2022 None Detected  NONE DETECTED (Cut Off Level 300 ng/mL) Preliminary   POC Methadone UR 07/25/2022 None Detected  NONE DETECTED (Cut Off Level 300 ng/mL) Preliminary   POC Oxycodone UR 07/25/2022 None Detected  NONE DETECTED (Cut Off Level 100 ng/mL) Preliminary   POC Marijuana UR 07/25/2022 None Detected  NONE DETECTED (Cut Off Level 50 ng/mL) Preliminary   SARSCOV2ONAVIRUS 2 AG 07/25/2022 NEGATIVE  NEGATIVE Final   Comment: (NOTE) SARS-CoV-2 antigen NOT DETECTED.   Negative results are presumptive.  Negative results do not preclude SARS-CoV-2 infection and should not be used as the sole basis for treatment or other patient management decisions, including infection  control decisions, particularly in the presence of clinical signs and  symptoms consistent with COVID-19, or in those who have been in contact with the virus.  Negative results must be combined with clinical observations, patient history, and epidemiological information. The expected result is Negative.  Fact Sheet for Patients: https://www.jennings-kim.com/  Fact Sheet for Healthcare Providers: https://alexander-rogers.biz/  This test is not yet approved or cleared by the [place] FDA and  has been authorized for detection and/or diagnosis of SARS-CoV-2 by FDA under an Emergency Use Authorization (EUA).  This EUA will remain in effect (meaning this test can be used) for the duration of  the COV                          ID-19 declaration under Section 564(b)(1) of the Act, 21 U.S.C. section 360bbb-3(b)(1), unless the authorization is terminated or revoked sooner.     Cholesterol 07/25/2022 181  0 - 200 mg/dL Final   Triglycerides 66/03/3015 118  <150 mg/dL  Final   HDL 10/22/3233 45  >40 mg/dL Final   Total CHOL/HDL Ratio 07/25/2022 4.0  RATIO Final   VLDL 07/25/2022 24  0 - 40 mg/dL Final   LDL Cholesterol 07/25/2022 112 (H)  0 - 99 mg/dL Final   Comment:        Total Cholesterol/HDL:CHD Risk Coronary Heart Disease Risk Table                     Men   Women  1/2 Average Risk   3.4   3.3  Average Risk       5.0   4.4  2 X Average Risk   9.6   7.1  3 X Average Risk  23.4   11.0        Use the calculated Patient Ratio above and the CHD Risk Table to determine the patient's CHD Risk.        ATP III CLASSIFICATION (LDL):  <100     mg/dL   Optimal  573-220  mg/dL   Near or Above                    Optimal  130-159  mg/dL   Borderline  254-270  mg/dL   High  >623  mg/dL   Very High Performed at [facility], [address] [place], [place] [address]    TSH 07/25/2022 6.668 (H)  0.350 - 4.500 uIU/mL Final   Comment: Performed by a 3rd Generation assay with a functional sensitivity of <=0.01 uIU/mL. Performed at [facility], [address] [place], [place] [address]    Glucose-Capillary 07/26/2022 104 (H)  70 - 99 mg/dL Final   Glucose reference range applies only to samples taken after fasting for at least 8 hours.   T3, Free 07/31/2022 2.3  2.0 - 4.4 pg/mL Final   Comment: (NOTE) Performed At: [facility] [address] [place], [place] [id number] Jolene Schimke MD PY:[id number]    Free T4 07/31/2022 0.60 (L)  0.61 - 1.12 ng/dL Final   Comment: (NOTE) Biotin ingestion may interfere with free T4 tests. If the results are inconsistent with the TSH level, previous test results, or the clinical presentation, then consider biotin interference. If needed, order repeat testing after stopping biotin. Performed at [facility], [address] [place], [place] [address]    Glucose-Capillary 08/29/2022 100 (H)  70 - 99 mg/dL Final   Glucose reference range applies only to samples taken after fasting for  at least 8 hours.   TSH 09/05/2022 0.793  0.350 - 4.500 uIU/mL Final   Comment: Performed by a 3rd Generation assay with a functional sensitivity of <=0.01 uIU/mL. Performed at [facility], [address] [place], [place] [address]    Free T4 09/05/2022 0.73  0.61 - 1.12 ng/dL Final   Comment: (NOTE) Biotin ingestion may interfere with free T4 tests. If the results are inconsistent with the TSH level, previous test results, or the clinical presentation, then consider biotin interference. If needed, order repeat testing after stopping biotin. Performed at [facility], [address], [place], [place] [address]    Preg Test, Ur 09/06/2022 Negative  Negative Final   Preg Test, Ur 09/06/2022 NEGATIVE  NEGATIVE Final   Comment:        THE SENSITIVITY OF THIS METHODOLOGY IS >24 mIU/mL    Preg Test, Ur 09/05/2022 NEGATIVE  NEGATIVE Final   Comment:        THE SENSITIVITY OF THIS METHODOLOGY IS >24 mIU/mL    Sodium 09/13/2022 136  135 - 145 mmol/L Final   Potassium 09/13/2022 4.5  3.5 - 5.1 mmol/L Final   Chloride 09/13/2022 105  98 - 111 mmol/L Final   CO2 09/13/2022 21 (L)  22 - 32 mmol/L Final   Glucose, Bld 09/13/2022 111 (H)  70 - 99 mg/dL Final   Glucose reference range applies only to samples taken after fasting for at least 8 hours.   BUN 09/13/2022 14  6 - 20 mg/dL Final   Creatinine, Ser 09/13/2022 0.92  0.44 - 1.00 mg/dL Final   Calcium 39/76/7341 9.1  8.9 - 10.3 mg/dL Final   GFR, Estimated 09/13/2022 >60  >60 mL/min Final   Comment: (NOTE) Calculated using the CKD-EPI Creatinine Equation (2021)    Anion gap 09/13/2022 10  5 - 15 Final   Performed at [facility], [address], [place], [place] [address]   Vitamin B-12 09/13/2022 585  180 - 914 pg/mL Final   Comment: (NOTE) This assay is not validated for testing neonatal or myeloproliferative syndrome specimens for Vitamin B12 levels. Performed at [facility], [address]  [place], [place] [address]  Color, Urine 09/14/2022 YELLOW  YELLOW Final   APPearance 09/14/2022 HAZY (A)  CLEAR Final   Specific Gravity, Urine 09/14/2022 1.025  1.005 - 1.030 Final   pH 09/14/2022 5.0  5.0 - 8.0 Final   Glucose, UA 09/14/2022 NEGATIVE  NEGATIVE mg/dL Final   Hgb urine dipstick 09/14/2022 NEGATIVE  NEGATIVE Final   Bilirubin Urine 09/14/2022 NEGATIVE  NEGATIVE Final   Ketones, ur 09/14/2022 NEGATIVE  NEGATIVE mg/dL Final   Protein, ur 40/98/1191 NEGATIVE  NEGATIVE mg/dL Final   Nitrite 47/82/9562 NEGATIVE  NEGATIVE Final   Leukocytes,Ua 09/14/2022 TRACE (A)  NEGATIVE Final   RBC / HPF 09/14/2022 0-5  0 - 5 RBC/hpf Final   WBC, UA 09/14/2022 0-5  0 - 5 WBC/hpf Final   Bacteria, UA 09/14/2022 RARE (A)  NONE SEEN Final   Squamous Epithelial / HPF 09/14/2022 0-5  0 - 5 Final   Mucus 09/14/2022 PRESENT   Final   Performed at [facility], [address], [place], [place] [address]   Glucose-Capillary 09/16/2022 105 (H)  70 - 99 mg/dL Final   Glucose reference range applies only to samples taken after fasting for at least 8 hours.   SARS Coronavirus 2 by RT PCR 09/30/2022 NEGATIVE  NEGATIVE Final   Comment: (NOTE) SARS-CoV-2 target nucleic acids are NOT DETECTED.  The SARS-CoV-2 RNA is generally detectable in upper respiratory specimens during the acute phase of infection. The lowest concentration of SARS-CoV-2 viral copies this assay can detect is 138 copies/mL. A negative result does not preclude SARS-Cov-2 infection and should not be used as the sole basis for treatment or other patient management decisions. A negative result may occur with  improper specimen collection/handling, submission of specimen other than nasopharyngeal swab, presence of viral mutation(s) within the areas targeted by this assay, and inadequate number of viral copies(<138 copies/mL). A negative result must be combined with clinical observations, patient history, and  epidemiological information. The expected result is Negative.  Fact Sheet for Patients:  BloggerCourse.com  Fact Sheet for Healthcare Providers:  SeriousBroker.it  This test is no                          t yet approved or cleared by the [place] FDA and  has been authorized for detection and/or diagnosis of SARS-CoV-2 by FDA under an Emergency Use Authorization (EUA). This EUA will remain  in effect (meaning this test can be used) for the duration of the COVID-19 declaration under Section 564(b)(1) of the Act, 21 U.S.C.section 360bbb-3(b)(1), unless the authorization is terminated  or revoked sooner.       Influenza A by PCR 09/30/2022 NEGATIVE  NEGATIVE Final   Influenza B by PCR 09/30/2022 NEGATIVE  NEGATIVE Final   Comment: (NOTE) The Xpert Xpress SARS-CoV-2/FLU/RSV plus assay is intended as an aid in the diagnosis of influenza from Nasopharyngeal swab specimens and should not be used as a sole basis for treatment. Nasal washings and aspirates are unacceptable for Xpert Xpress SARS-CoV-2/FLU/RSV testing.  Fact Sheet for Patients: BloggerCourse.com  Fact Sheet for Healthcare Providers: SeriousBroker.it  This test is not yet approved or cleared by the [place] FDA and has been authorized for detection and/or diagnosis of SARS-CoV-2 by FDA under an Emergency Use Authorization (EUA). This EUA will remain in effect (meaning this test can be used) for the duration of the COVID-19 declaration under Section 564(b)(1) of the Act, 21 U.S.C. section 360bbb-3(b)(1), unless the authorization  is terminated or revoked.     Resp Syncytial Virus by PCR 09/30/2022 NEGATIVE  NEGATIVE Final   Comment: (NOTE) Fact Sheet for Patients: BloggerCourse.com  Fact Sheet for Healthcare Providers: SeriousBroker.it  This test is not yet  approved or cleared by the [place] FDA and has been authorized for detection and/or diagnosis of SARS-CoV-2 by FDA under an Emergency Use Authorization (EUA). This EUA will remain in effect (meaning this test can be used) for the duration of the COVID-19 declaration under Section 564(b)(1) of the Act, 21 U.S.C. section 360bbb-3(b)(1), unless the authorization is terminated or revoked.  Performed at [facility], [address], [place], [place] [address]    Sodium 10/01/2022 136  135 - 145 mmol/L Final   Potassium 10/01/2022 3.8  3.5 - 5.1 mmol/L Final   Chloride 10/01/2022 104  98 - 111 mmol/L Final   CO2 10/01/2022 27  22 - 32 mmol/L Final   Glucose, Bld 10/01/2022 98  70 - 99 mg/dL Final   Glucose reference range applies only to samples taken after fasting for at least 8 hours.   BUN 10/01/2022 12  6 - 20 mg/dL Final   Creatinine, Ser 10/01/2022 0.98  0.44 - 1.00 mg/dL Final   Calcium 60/45/4098 8.9  8.9 - 10.3 mg/dL Final   GFR, Estimated 10/01/2022 >60  >60 mL/min Final   Comment: (NOTE) Calculated using the CKD-EPI Creatinine Equation (2021)    Anion gap 10/01/2022 5  5 - 15 Final   Performed at [facility], [address] [place], [place] [address]   WBC 10/01/2022 5.1  4.0 - 10.5 K/uL Final   RBC 10/01/2022 4.31  3.87 - 5.11 MIL/uL Final   Hemoglobin 10/01/2022 12.7  12.0 - 15.0 g/dL Final   HCT 78/29/5621 38.8  36.0 - 46.0 % Final   MCV 10/01/2022 90.0  80.0 - 100.0 fL Final   MCH 10/01/2022 29.5  26.0 - 34.0 pg Final   MCHC 10/01/2022 32.7  30.0 - 36.0 g/dL Final   RDW 30/86/5784 12.4  11.5 - 15.5 % Final   Platelets 10/01/2022 300  150 - 400 K/uL Final   nRBC 10/01/2022 0.0  0.0 - 0.2 % Final   Performed at [facility], [address] [place], [place] [address]   Lipase 10/01/2022 29  11 - 51 U/L Final   Performed at [facility], [address], [place], [place] [address]   Color, Urine 10/05/2022 YELLOW  YELLOW Final   APPearance  10/05/2022 HAZY (A)  CLEAR Final   Specific Gravity, Urine 10/05/2022 1.018  1.005 - 1.030 Final   pH 10/05/2022 5.0  5.0 - 8.0 Final   Glucose, UA 10/05/2022 NEGATIVE  NEGATIVE mg/dL Final   Hgb urine dipstick 10/05/2022 NEGATIVE  NEGATIVE Final   Bilirubin Urine 10/05/2022 NEGATIVE  NEGATIVE Final   Ketones, ur 10/05/2022 NEGATIVE  NEGATIVE mg/dL Final   Protein, ur 32/44/0102 NEGATIVE  NEGATIVE mg/dL Final   Nitrite 72/53/6644 NEGATIVE  NEGATIVE Final   Leukocytes,Ua 10/05/2022 TRACE (A)  NEGATIVE Final   RBC / HPF 10/05/2022 0-5  0 - 5 RBC/hpf Final   WBC, UA 10/05/2022 0-5  0 - 5 WBC/hpf Final   Bacteria, UA 10/05/2022 RARE (A)  NONE SEEN Final   Squamous Epithelial / HPF 10/05/2022 0-5  0 - 5 Final   Mucus 10/05/2022 PRESENT   Final   Performed at [facility], [address],  [place], [place] [address]   Specimen Description 10/05/2022 URINE, CLEAN CATCH   Final   Special Requests 10/05/2022    Final                   Value:NONE Performed at [facility], [address] [place], [place] [address]    Culture 10/05/2022 10,000 COLONIES/mL MULTIPLE SPECIES PRESENT, SUGGEST RECOLLECTION (A)   Final   Report Status 10/05/2022 10/07/2022 FINAL   Final  Admission on 07/04/2022, Discharged on 07/05/2022  Component Date Value Ref Range Status   Sodium 07/04/2022 142  135 - 145 mmol/L Final   Potassium 07/04/2022 4.4  3.5 - 5.1 mmol/L Final   Chloride 07/04/2022 109  98 - 111 mmol/L Final   CO2 07/04/2022 26  22 - 32 mmol/L Final   Glucose, Bld 07/04/2022 96  70 - 99 mg/dL Final   Glucose reference range applies only to samples taken after fasting for at least 8 hours.   BUN 07/04/2022 15  6 - 20 mg/dL Final   Creatinine, Ser 07/04/2022 0.83  0.44 - 1.00 mg/dL Final   Calcium 82/95/621309/21/2023 9.3  8.9 - 10.3 mg/dL Final   Total Protein 08/65/784609/21/2023 7.2  6.5 - 8.1 g/dL Final   Albumin 96/29/528409/21/2023 3.9  3.5 - 5.0 g/dL Final   AST 13/24/401009/21/2023 23  15 - 41 U/L Final   ALT 07/04/2022 28   0 - 44 U/L Final   Alkaline Phosphatase 07/04/2022 77  38 - 126 U/L Final   Total Bilirubin 07/04/2022 0.4  0.3 - 1.2 mg/dL Final   GFR, Estimated 07/04/2022 >60  >60 mL/min Final   Comment: (NOTE) Calculated using the CKD-EPI Creatinine Equation (2021)    Anion gap 07/04/2022 7  5 - 15 Final   Performed at [facility], [address] [place], [place] [address]   Alcohol, Ethyl (B) 07/04/2022 <10  <10 mg/dL Final   Comment: (NOTE) Lowest detectable limit for serum alcohol is 10 mg/dL.  For medical purposes only. Performed at [facility], [address] [place], [place] [address]    WBC 07/04/2022 7.0  4.0 - 10.5 K/uL Final   RBC 07/04/2022 4.18  3.87 - 5.11 MIL/uL Final   Hemoglobin 07/04/2022 12.8  12.0 - 15.0 g/dL Final   HCT 34/74/259509/21/2023 39.1  36.0 - 46.0 % Final   MCV 07/04/2022 93.5  80.0 - 100.0 fL Final   MCH 07/04/2022 30.6  26.0 - 34.0 pg Final   MCHC 07/04/2022 32.7  30.0 - 36.0 g/dL Final   RDW 63/87/564309/21/2023 12.9  11.5 - 15.5 % Final   Platelets 07/04/2022 243  150 - 400 K/uL Final   nRBC 07/04/2022 0.0  0.0 - 0.2 % Final   Neutrophils Relative % 07/04/2022 43  % Final   Neutro Abs 07/04/2022 3.0  1.7 - 7.7 K/uL Final   Lymphocytes Relative 07/04/2022 50  % Final   Lymphs Abs 07/04/2022 3.5  0.7 - 4.0 K/uL Final   Monocytes Relative 07/04/2022 7  % Final   Monocytes Absolute 07/04/2022 0.5  0.1 - 1.0 K/uL Final   Eosinophils Relative 07/04/2022 0  % Final   Eosinophils Absolute 07/04/2022 0.0  0.0 - 0.5 K/uL Final   Basophils Relative 07/04/2022 0  % Final   Basophils Absolute 07/04/2022 0.0  0.0 - 0.1 K/uL Final   Immature Granulocytes 07/04/2022 0  % Final   Abs Immature Granulocytes 07/04/2022 0.01  0.00 - 0.07 K/uL  Final   Performed at [facility], [address], [place], [place] [address]   I-stat hCG, quantitative 07/04/2022 <5.0  <5 mIU/mL Final   Comment 3 07/04/2022          Final   Comment:    GEST. AGE      CONC.  (mIU/mL)   <=1 WEEK        5 - 50     2 WEEKS       50 - 500     3 WEEKS       100 - 10,000     4 WEEKS     1,000 - 30,000        FEMALE AND NON-PREGNANT FEMALE:     LESS THAN 5 mIU/mL   Admission on 06/14/2022, Discharged on 06/14/2022  Component Date Value Ref Range Status   Color, UA 06/14/2022 yellow  yellow Final   Clarity, UA 06/14/2022 cloudy (A)  clear Final   Glucose, UA 06/14/2022 negative  negative mg/dL Final   Bilirubin, UA 19/14/7829 negative  negative Final   Ketones, POC UA 06/14/2022 negative  negative mg/dL Final   Spec Grav, UA 56/21/3086 1.025  1.010 - 1.025 Final   Blood, UA 06/14/2022 negative  negative Final   pH, UA 06/14/2022 7.5  5.0 - 8.0 Final   Protein Ur, POC 06/14/2022 =30 (A)  negative mg/dL Final   Urobilinogen, UA 06/14/2022 0.2  0.2 or 1.0 E.U./dL Final   Nitrite, UA 57/84/6962 Negative  Negative Final   Leukocytes, UA 06/14/2022 Negative  Negative Final   Preg Test, Ur 06/14/2022 Negative  Negative Final   Specimen Description 06/14/2022 URINE, CLEAN CATCH   Final   Special Requests 06/14/2022    Final                   Value:NONE Performed at [facility], [address] [place], [place] [address]    Culture 06/14/2022 MULTIPLE SPECIES PRESENT, SUGGEST RECOLLECTION (A)   Final   Report Status 06/14/2022 06/16/2022 FINAL   Final  Admission on 06/07/2022, Discharged on 06/10/2022  Component Date Value Ref Range Status   SARS Coronavirus 2 by RT PCR 06/07/2022 NEGATIVE  NEGATIVE Final   Comment: (NOTE) SARS-CoV-2 target nucleic acids are NOT DETECTED.  The SARS-CoV-2 RNA is generally detectable in upper respiratory specimens during the acute phase of infection. The lowest concentration of SARS-CoV-2 viral copies this assay can detect is 138 copies/mL. A negative result does not preclude SARS-Cov-2 infection and should not be used as the sole basis for treatment or other patient management decisions. A negative  result may occur with  improper specimen collection/handling, submission of specimen other than nasopharyngeal swab, presence of viral mutation(s) within the areas targeted by this assay, and inadequate number of viral copies(<138 copies/mL). A negative result must be combined with clinical observations, patient history, and epidemiological information. The expected result is Negative.  Fact Sheet for Patients:  BloggerCourse.com  Fact Sheet for Healthcare Providers:  SeriousBroker.it  This test is no                          t yet approved or cleared by the [place] FDA and  has been authorized for detection and/or diagnosis of SARS-CoV-2 by FDA under an Emergency Use Authorization (EUA). This EUA will remain  in effect (meaning this test can be used) for the duration of the COVID-19 declaration  under Section 564(b)(1) of the Act, 21 U.S.C.section 360bbb-3(b)(1), unless the authorization is terminated  or revoked sooner.       Influenza A by PCR 06/07/2022 NEGATIVE  NEGATIVE Final   Influenza B by PCR 06/07/2022 NEGATIVE  NEGATIVE Final   Comment: (NOTE) The Xpert Xpress SARS-CoV-2/FLU/RSV plus assay is intended as an aid in the diagnosis of influenza from Nasopharyngeal swab specimens and should not be used as a sole basis for treatment. Nasal washings and aspirates are unacceptable for Xpert Xpress SARS-CoV-2/FLU/RSV testing.  Fact Sheet for Patients: BloggerCourse.com  Fact Sheet for Healthcare Providers: SeriousBroker.it  This test is not yet approved or cleared by the [place] FDA and has been authorized for detection and/or diagnosis of SARS-CoV-2 by FDA under an Emergency Use Authorization (EUA). This EUA will remain in effect (meaning this test can be used) for the duration of the COVID-19 declaration under Section 564(b)(1) of the Act, 21 U.S.C. section  360bbb-3(b)(1), unless the authorization is terminated or revoked.  Performed at [facility], [address] [place], [place] [address]    WBC 06/07/2022 5.7  4.0 - 10.5 K/uL Final   RBC 06/07/2022 4.39  3.87 - 5.11 MIL/uL Final   Hemoglobin 06/07/2022 13.2  12.0 - 15.0 g/dL Final   HCT 60/45/4098 40.4  36.0 - 46.0 % Final   MCV 06/07/2022 92.0  80.0 - 100.0 fL Final   MCH 06/07/2022 30.1  26.0 - 34.0 pg Final   MCHC 06/07/2022 32.7  30.0 - 36.0 g/dL Final   RDW 11/91/4782 12.4  11.5 - 15.5 % Final   Platelets 06/07/2022 308  150 - 400 K/uL Final   nRBC 06/07/2022 0.0  0.0 - 0.2 % Final   Neutrophils Relative % 06/07/2022 42  % Final   Neutro Abs 06/07/2022 2.4  1.7 - 7.7 K/uL Final   Lymphocytes Relative 06/07/2022 54  % Final   Lymphs Abs 06/07/2022 3.1  0.7 - 4.0 K/uL Final   Monocytes Relative 06/07/2022 4  % Final   Monocytes Absolute 06/07/2022 0.2  0.1 - 1.0 K/uL Final   Eosinophils Relative 06/07/2022 0  % Final   Eosinophils Absolute 06/07/2022 0.0  0.0 - 0.5 K/uL Final   Basophils Relative 06/07/2022 0  % Final   Basophils Absolute 06/07/2022 0.0  0.0 - 0.1 K/uL Final   Immature Granulocytes 06/07/2022 0  % Final   Abs Immature Granulocytes 06/07/2022 0.01  0.00 - 0.07 K/uL Final   Performed at [facility], [address] [place], [place] [address]   Sodium 06/07/2022 139  135 - 145 mmol/L Final   Potassium 06/07/2022 4.0  3.5 - 5.1 mmol/L Final   Chloride 06/07/2022 104  98 - 111 mmol/L Final   CO2 06/07/2022 28  22 - 32 mmol/L Final   Glucose, Bld 06/07/2022 104 (H)  70 - 99 mg/dL Final   Glucose reference range applies only to samples taken after fasting for at least 8 hours.   BUN 06/07/2022 8  6 - 20 mg/dL Final   Creatinine, Ser 06/07/2022 0.84  0.44 - 1.00 mg/dL Final   Calcium 30/86/5784 9.1  8.9 - 10.3 mg/dL Final   Total Protein 69/62/9528 6.9  6.5 - 8.1 g/dL Final   Albumin 41/32/4401 3.7  3.5 - 5.0 g/dL Final   AST 02/72/5366 19  15 - 41  U/L Final   ALT 06/07/2022 22  0 - 44 U/L Final   Alkaline Phosphatase 06/07/2022 54  38 - 126 U/L Final   Total Bilirubin 06/07/2022 0.4  0.3 - 1.2 mg/dL Final   GFR, Estimated 06/07/2022 >60  >60 mL/min Final   Comment: (NOTE) Calculated using the CKD-EPI Creatinine Equation (2021)    Anion gap 06/07/2022 7  5 - 15 Final   Performed at [facility], [address] [place], [place] [address]   Hgb A1c MFr Bld 06/07/2022 5.0  4.8 - 5.6 % Final   Comment: (NOTE) Pre diabetes:          5.7%-6.4%  Diabetes:              >6.4%  Glycemic control for   <7.0% adults with diabetes    Mean Plasma Glucose 06/07/2022 96.8  mg/dL Final   Performed at [facility], [address], [place], [place] [address]   TSH 06/07/2022 1.620  0.350 - 4.500 uIU/mL Final   Comment: Performed by a 3rd Generation assay with a functional sensitivity of <=0.01 uIU/mL. Performed at [facility], [address], [place], [place] [address]    RPR Ser Ql 06/07/2022 NON REACTIVE  NON REACTIVE Final   Performed at [facility], [address], [place], [place] [address]   Color, Urine 06/07/2022 YELLOW  YELLOW Final   APPearance 06/07/2022 HAZY (A)  CLEAR Final   Specific Gravity, Urine 06/07/2022 1.018  1.005 - 1.030 Final   pH 06/07/2022 7.0  5.0 - 8.0 Final   Glucose, UA 06/07/2022 NEGATIVE  NEGATIVE mg/dL Final   Hgb urine dipstick 06/07/2022 NEGATIVE  NEGATIVE Final   Bilirubin Urine 06/07/2022 NEGATIVE  NEGATIVE Final   Ketones, ur 06/07/2022 NEGATIVE  NEGATIVE mg/dL Final   Protein, ur 67/61/9509 NEGATIVE  NEGATIVE mg/dL Final   Nitrite 32/67/1245 NEGATIVE  NEGATIVE Final   Leukocytes,Ua 06/07/2022 NEGATIVE  NEGATIVE Final   Performed at [facility], [address] [place], [place] [address]   Cholesterol 06/07/2022 164  0 - 200 mg/dL Final   Triglycerides 33/82/5053 158 (H)  <150 mg/dL Final   HDL 97/67/3419 44  >40 mg/dL Final   Total CHOL/HDL Ratio 06/07/2022 3.7   RATIO Final   VLDL 06/07/2022 32  0 - 40 mg/dL Final   LDL Cholesterol 06/07/2022 88  0 - 99 mg/dL Final   Comment:        Total Cholesterol/HDL:CHD Risk Coronary Heart Disease Risk Table                     Men   Women  1/2 Average Risk   3.4   3.3  Average Risk       5.0   4.4  2 X Average Risk   9.6   7.1  3 X Average Risk  23.4   11.0        Use the calculated Patient Ratio above and the CHD Risk Table to determine the patient's CHD Risk.        ATP III CLASSIFICATION (LDL):  <100     mg/dL   Optimal  379-024  mg/dL   Near or Above                    Optimal  130-159  mg/dL   Borderline  097-353  mg/dL   High  >299     mg/dL   Very High Performed at [facility], [address], [place], [place] [address]  HIV Screen 4th Generation wRfx 06/07/2022 Non Reactive  Non Reactive Final   Performed at [facility], [address] [place], [place] [address]   SARSCOV2ONAVIRUS 2 AG 06/07/2022 NEGATIVE  NEGATIVE Final   Comment: (NOTE) SARS-CoV-2 antigen NOT DETECTED.   Negative results are presumptive.  Negative results do not preclude SARS-CoV-2 infection and should not be used as the sole basis for treatment or other patient management decisions, including infection  control decisions, particularly in the presence of clinical signs and  symptoms consistent with COVID-19, or in those who have been in contact with the virus.  Negative results must be combined with clinical observations, patient history, and epidemiological information. The expected result is Negative.  Fact Sheet for Patients: https://www.jennings-kim.com/  Fact Sheet for Healthcare Providers: https://alexander-rogers.biz/  This test is not yet approved or cleared by the [place] FDA and  has been authorized for detection and/or diagnosis of SARS-CoV-2 by FDA under an Emergency Use Authorization (EUA).  This EUA will remain in effect (meaning this test can be  used) for the duration of  the COV                          ID-19 declaration under Section 564(b)(1) of the Act, 21 U.S.C. section 360bbb-3(b)(1), unless the authorization is terminated or revoked sooner.     POC Amphetamine UR 06/07/2022 None Detected  NONE DETECTED (Cut Off Level 1000 ng/mL) Final   POC Secobarbital (BAR) 06/07/2022 None Detected  NONE DETECTED (Cut Off Level 300 ng/mL) Final   POC Buprenorphine (BUP) 06/07/2022 None Detected  NONE DETECTED (Cut Off Level 10 ng/mL) Final   POC Oxazepam (BZO) 06/07/2022 None Detected  NONE DETECTED (Cut Off Level 300 ng/mL) Final   POC Cocaine UR 06/07/2022 None Detected  NONE DETECTED (Cut Off Level 300 ng/mL) Final   POC Methamphetamine UR 06/07/2022 None Detected  NONE DETECTED (Cut Off Level 1000 ng/mL) Final   POC Morphine 06/07/2022 None Detected  NONE DETECTED (Cut Off Level 300 ng/mL) Final   POC Methadone UR 06/07/2022 None Detected  NONE DETECTED (Cut Off Level 300 ng/mL) Final   POC Oxycodone UR 06/07/2022 None Detected  NONE DETECTED (Cut Off Level 100 ng/mL) Final   POC Marijuana UR 06/07/2022 None Detected  NONE DETECTED (Cut Off Level 50 ng/mL) Final   Preg Test, Ur 06/08/2022 NEGATIVE  NEGATIVE Final   Comment:        THE SENSITIVITY OF THIS METHODOLOGY IS >20 mIU/mL. Performed at [facility], [address], [place], [place] [address]    Preg Test, Ur 06/08/2022 NEGATIVE  NEGATIVE Final   Comment:        THE SENSITIVITY OF THIS METHODOLOGY IS >24 mIU/mL     Blood Alcohol level:  Lab Results  Component Value Date   ETH <10 07/04/2022   ETH <10 02/07/2022    Metabolic Disorder Labs: Lab Results  Component Value Date   HGBA1C 5.7 (H) 10/15/2022   MPG 117 10/15/2022   MPG 96.8 06/07/2022   No results found for: "PROLACTIN" Lab Results  Component Value Date   CHOL 181 07/25/2022   TRIG 118 07/25/2022   HDL 45 07/25/2022   CHOLHDL 4.0 07/25/2022   VLDL 24 07/25/2022   LDLCALC 112 (H) 07/25/2022    LDLCALC 88 06/07/2022    Therapeutic Lab Levels: No results found for: "LITHIUM" No results found for: "VALPROATE" No results found for: "CBMZ"  Physical Findings   GAD-7    Flowsheet Row [facility]  Total GAD-7 Score 17      PHQ2-9    Flowsheet Row [facility]  PHQ-2 Total Score 1 2  PHQ-9 Total Score 2 8      Flowsheet Row [facility] Most recent reading at 10/20/2022 11:04 AM ED [facility] 10/07/2022 [facility] Most recent reading at 10/07/2022 12:18 PM ED [facility]20[facility] Most recent reading at 10/06/2022  9:38 AM  C-SSRS RISK CATEGORY No Risk No Risk No Risk        Musculoskeletal  Strength & Muscle Tone: within normal limits Gait & Station: normal Patient leans: N/A  Psychiatric Specialty Exam  Presentation  General Appearance:  Appropriate for Environment; Casual  Eye Contact: Good  Speech: Clear and Coherent; Normal Rate  Speech Volume: Normal  Handedness: Right   Mood and Affect  Mood: Euthymic  Affect: Congruent   Thought Process  Thought Processes: Coherent  Descriptions of Associations:Intact  Orientation:Full (Time, Place and Person)  Thought Content:Logical  Diagnosis of Schizophrenia or Schizoaffective disorder in past: No    Hallucinations:denied  Ideas of Reference:None  Suicidal Thoughts:denied Homicidal Thoughts:denied   Sensorium  Memory: Immediate Good; Recent Good; Remote Good  Judgment: Good  Insight: Good   Executive Functions  Concentration: Good  Attention Span: Good  Recall: Good  Fund of Knowledge: Good  Language: Good   Psychomotor Activity  Psychomotor  Activity: normal   Assets  Assets: Communication Skills; Desire for Improvement; Leisure Time; Physical Health; Resilience; Social Support   Sleep  Sleep: good  Physical Exam  Physical Exam Vitals and nursing note reviewed.  Constitutional:      General: She is not in acute distress.    Appearance: She is not ill-appearing or diaphoretic.  HENT:     Head: Normocephalic and atraumatic.  Pulmonary:     Effort: Pulmonary effort is normal. No respiratory distress.  Neurological:     General: No focal deficit present.     Mental Status: She is alert.    Review of Systems  HENT:  Positive for ear pain (improving). Negative for ear discharge.   Respiratory:  Negative for shortness of breath.   Cardiovascular:  Negative for chest pain.  Gastrointestinal:  Positive for constipation. Negative for abdominal pain, nausea and vomiting.  Musculoskeletal:  Positive for back pain (due to recliner).  Neurological:  Negative for dizziness and headaches.   Blood pressure 130/70, pulse 80, temperature 98 F (36.7 C), temperature source Oral, resp. rate 18, height 5\' 1"  (1.549 m), weight 198 lb (89.8 kg), SpO2 100 %. Body mass index is 37.41 kg/m.  Treatment Plan Summary: Plan Patient continues to be psychiatrically cleared, stable and ready for discharge.  Social work/TOC, DSS to continue seeking appropriate placement for patient.    Princess BruinsJulie Laurabelle Gorczyca, DO 10/22/2022 11:29 AM

## 2022-10-22 NOTE — Progress Notes (Signed)
Patient is currently sleeping with respirations even and unlabored. 

## 2022-10-22 NOTE — ED Notes (Signed)
Pt accepted scheduled meds w/o difficulty. Breakfast provided. Patient requested towel and washcloth, provided per request. Safety maintained and will continue to monitor.

## 2022-10-22 NOTE — Care Management (Signed)
OBS Care Management   Treatment Team Meeting   In attendance [place]: Care Coordinator  Roderic Ovens [place] : Patient  [name]:  [place] [facility] Supervisor  Olam Idler ([place]) Lourena Simmonds  - [facility]  [place] Supervisor [place]; [place] Worker  The legal guardian and the father were not able to be in attendance.    Quentin Ore will move into [facility] on November 04, 2022 at 10am    The following action items need to take place prior to her being able to be discharged    Per the IDD Supervisor Danika Christmas the [facility] single case agreement was accepted but she will still have to complete the paperwork to have her [place] in the [place] network.  Therefore, Dolphus Jenny will complete and submit this afternoon.   Mercie and Carlis Abbott will expedite the [place] paperwork in order for Anika to be in the [place] network.  Per Danika the [place] paperwork should take 3 days to process.   After the paperwork has been processed Mercie and Carlis Abbott will expedite the Health and Safety inspection.  [name] will have the MD at [facility] to sign the PCP plan.  [name] will coordinate with the pharmacist at the [facility]  for the patient medication to be sent to [place] in [facility].  The patient will also be discharged with a wee's worth of medication [place].    [name] will follow up with the Guardian at [place] ([name] III)  [name] will obtain a copy of Ra[place]'s medicaid card   Merci will begin the process to have the patient on the [facility] wait list  The father has an court date on October 30, 2022 at 3:30p to have guardianship changed from the mother to the father  Merci will have the legal guardian to sign the intake paperwork for the patient to be admitted to [facility].   Follow up Treatment Team Meeting on October 29, 2022 at 1pm

## 2022-10-22 NOTE — ED Notes (Signed)
Patient off the floor with social worker for a meeting.

## 2022-10-22 NOTE — ED Notes (Signed)
Pt sleeping at present, no distress noted.  Monitoring for safety. 

## 2022-10-22 NOTE — ED Notes (Signed)
Pt was up and greeted night shift employees with a smile and she stated she was happy to see [place]. She is calm and cooperative with no c/o pain or distress. Will continue t to monitor for safety

## 2022-10-23 DIAGNOSIS — Z1152 Encounter for screening for COVID-19: Secondary | ICD-10-CM | POA: Diagnosis not present

## 2022-10-23 DIAGNOSIS — N9489 Other specified conditions associated with female genital organs and menstrual cycle: Secondary | ICD-10-CM | POA: Diagnosis not present

## 2022-10-23 DIAGNOSIS — F419 Anxiety disorder, unspecified: Secondary | ICD-10-CM | POA: Diagnosis not present

## 2022-10-23 DIAGNOSIS — F17200 Nicotine dependence, unspecified, uncomplicated: Secondary | ICD-10-CM | POA: Diagnosis not present

## 2022-10-23 DIAGNOSIS — F209 Schizophrenia, unspecified: Secondary | ICD-10-CM | POA: Diagnosis not present

## 2022-10-23 DIAGNOSIS — F333 Major depressive disorder, recurrent, severe with psychotic symptoms: Secondary | ICD-10-CM | POA: Diagnosis not present

## 2022-10-23 DIAGNOSIS — E039 Hypothyroidism, unspecified: Secondary | ICD-10-CM | POA: Diagnosis not present

## 2022-10-23 DIAGNOSIS — N390 Urinary tract infection, site not specified: Secondary | ICD-10-CM | POA: Diagnosis not present

## 2022-10-23 DIAGNOSIS — E118 Type 2 diabetes mellitus with unspecified complications: Secondary | ICD-10-CM | POA: Diagnosis not present

## 2022-10-23 MED ORDER — LIDOCAINE 5 % EX PTCH
1.0000 | MEDICATED_PATCH | CUTANEOUS | Status: DC
  Administered 2022-10-23 – 2022-11-23 (×32): 1 via TRANSDERMAL
  Filled 2022-10-23 (×6): qty 1
  Filled 2022-10-23: qty 7
  Filled 2022-10-23 (×27): qty 1

## 2022-10-23 MED ORDER — ARIPIPRAZOLE ER 400 MG IM SRER
400.0000 mg | INTRAMUSCULAR | Status: DC
  Administered 2022-10-25 – 2022-11-22 (×2): 400 mg via INTRAMUSCULAR

## 2022-10-23 NOTE — ED Notes (Signed)
Pt at nurse's station speaking to staff at this hour. No apparent distress. RR even and unlabored. Monitored for safety.

## 2022-10-23 NOTE — ED Notes (Signed)
Patient calm and cooperative. Currently sleeping. No acute safety concerns at this time.

## 2022-10-23 NOTE — ED Provider Notes (Signed)
Behavioral Health Progress Note  Date and Time: 10/23/2022 9:47 AM Name: Nicole [name] MRN:  [id number]  HPI Nicole [name] 32 y.o., female patient with schizophrenia, MDD with psychosis,  presented to [facility], voluntarily, as a walk-in accompanied by [employer] after a disagreement and verbal altercation with personnel at the group home.  She has been dismissed previously from other group homes due to threatening behaviors, and suicidal ideation and the one which she was residing in.  Patient was admitted to continuous assessment unit on 07/25/2022 as she also endorsed passive SI at that time along with symptoms of visual hallucination. Patient was subsequently psychiatrically cleared during her admission to the continuous assessment unit.  However patient remains here at [facility] bordering until adequate- residential placement can be obtained.    Legal Guardian: [name]) transitioning to be [name]) Point of contact: [name])   Tests administered: Wechsler Adult Intelligence Scale-4 (see Eloise Harman, PhD consult note on 08/19/2022 for full info) Vineland-3 Adaptive Behavior Scales Assessment completed on 09/13/2022 administered by Rosezetta Schlatter, MD  Subjective:   Patient was initially seen, sleeping, no acute distress, awoken easily. She was sleepy appearing, engaged, pleasant and cooperative with evaluation. Reported feeling "good". Patient reported low back aching due to the recliners. She was amenable to lidocaine patch. Patient was instructed to get out of bed as much as possible to help with back pain. She denied ear ache or constipation. She continues to tolerate medications well, denied side effects. Endorsed stable and appropriate sleep and appetite.  Patient denied SI/HI/AVH, paranoia, and contracted to safety. Patient had no other questions or concerns and was amenable to plan.   Diagnosis:  Final diagnoses:  Tobacco use disorder  Schizophrenia, unspecified type  ([name])  MDD (major depressive disorder), recurrent episode, moderate ([facility])  Hypothyroidism, unspecified type    Total Time spent with patient: 15 minutes  Past Psychiatric History: schizoaffective, borderline IDD, anxiety, and MDD  Past Medical History:  Past Medical History:  Diagnosis Date   Anxiety    Depression    Hypothyroidism 08/07/2022   Tobacco use disorder 08/07/2022    Past Surgical History:  Procedure Laterality Date   WISDOM TOOTH EXTRACTION Bilateral 2020   Family History:  Family History  Problem Relation Age of Onset   Hypertension Father    Diabetes Father    Family Psychiatric  History: None reported Social History:  Social History   Substance and Sexual Activity  Alcohol Use Never     Social History   Substance and Sexual Activity  Drug Use Never    Social History   Socioeconomic History   Marital status: Single    Spouse name: Not on file   Number of children: Not on file   Years of education: Not on file   Highest education level: Not on file  Occupational History   Not on file  Tobacco Use   Smoking status: Every Day    Types: Cigarettes   Smokeless tobacco: Never  Vaping Use   Vaping Use: Some days  Substance and Sexual Activity   Alcohol use: Never   Drug use: Never   Sexual activity: Yes    Partners: Female    Birth control/protection: Implant  Other Topics Concern   Not on file  Social History Narrative   Not on file   Social Determinants of Health   Financial Resource Strain: Not on file  Food Insecurity: Not on file  Transportation Needs: Not on  file  Physical Activity: Not on file  Stress: Not on file  Social Connections: Not on file   SDOH:  SDOH Screenings   Depression (PHQ2-9): Low Risk  (06/10/2022)  Tobacco Use: High Risk (10/21/2022)   Additional Social History:     Sleep: Good  Appetite:  Good  Current Medications:  Current Facility-Administered Medications  Medication Dose Route Frequency  Provider Last Rate Last Admin   acetaminophen (TYLENOL) tablet 650 mg  650 mg Oral Q6H PRN Maple Hudson, Veronique M, NP       alum & mag hydroxide-simeth (MAALOX/MYLANTA) 200-200-20 MG/5ML suspension 30 mL  30 mL Oral Q4H PRN Maple Hudson, Veronique M, NP   30 mL at 10/20/22 2201   cetaphil lotion   Topical PRN Merrily Brittle, DO       fluticasone [facility]) 50 MCG/ACT nasal spray 1 spray  1 spray Each Nare QHS Merrily Brittle, DO   1 spray at 10/22/22 2104   hydrOXYzine (ATARAX) tablet 25 mg  25 mg Oral TID PRN Christene Slates, MD   25 mg at 10/22/22 2104   levothyroxine (SYNTHROID) tablet 100 mcg  100 mcg Oral Once Byungura, Veronique M, NP       levothyroxine (SYNTHROID) tablet 100 mcg  100 mcg Oral Q0600 Haynes Kerns, NP   100 mcg at 10/23/22 [id number]   loratadine (CLARITIN) tablet 10 mg  10 mg Oral Daily Carrion-Carrero, Caryl Ada, MD   10 mg at 10/23/22 0945   magnesium hydroxide (MILK OF MAGNESIA) suspension 30 mL  30 mL Oral Daily PRN Haynes Kerns, NP   30 mL at 10/12/22 2145   metFORMIN (GLUCOPHAGE) tablet 500 mg  500 mg Oral Q breakfast Carrion-Carrero, Caryl Ada, MD   500 mg at 10/23/22 0946   nicotine (NICODERM CQ - dosed in mg/24 hours) patch 14 mg  14 mg Transdermal Daily PRN Carrion-Carrero, Caryl Ada, MD       ondansetron (ZOFRAN-ODT) disintegrating tablet 4 mg  4 mg Oral Q8H PRN Carrion-Carrero, Margely, MD   4 mg at 10/22/22 1806   Oxcarbazepine (TRILEPTAL) tablet 300 mg  300 mg Oral BID Byungura, Veronique M, NP   300 mg at 10/23/22 0945   pantoprazole (PROTONIX) EC tablet 40 mg  40 mg Oral Daily Carrion-Carrero, Caryl Ada, MD   40 mg at 10/23/22 0946   polyethylene glycol (MIRALAX / GLYCOLAX) packet 17 g  17 g Oral Daily Merrily Brittle, DO   17 g at 10/23/22 0946   QUEtiapine (SEROQUEL) tablet 400 mg  400 mg Oral BID Maple Hudson, Veronique M, NP   400 mg at 10/23/22 0946   sertraline (ZOLOFT) tablet 150 mg  150 mg Oral Daily Carrion-Carrero, Margely, MD   150 mg at 10/23/22 0945    sodium chloride (OCEAN) 0.65 % nasal spray 1 spray  1 spray Each Nare Daily Merrily Brittle, DO   1 spray at 10/22/22 0946   traZODone (DESYREL) tablet 100 mg  100 mg Oral QHS Ronelle Nigh M, NP   100 mg at 10/22/22 2104   traZODone (DESYREL) tablet 50 mg  50 mg Oral QHS PRN Haynes Kerns, NP   50 mg at 10/21/22 2202   valACYclovir (VALTREX) tablet 500 mg  500 mg Oral Daily Carrion-Carrero, Caryl Ada, MD   500 mg at 10/23/22 0945   Current Outpatient Medications  Medication Sig Dispense Refill   ABILIFY MAINTENA 400 MG PRSY prefilled syringe 400 mg every 28 (twenty-eight) days.     cetirizine (ZYRTEC) 10 MG tablet Take 10 mg  by mouth daily.     cyclobenzaprine (FLEXERIL) 10 MG tablet Take 1 tablet (10 mg total) by mouth 2 (two) times daily as needed for muscle spasms. 20 tablet 0   fluticasone (FLONASE) 50 MCG/ACT nasal spray Place 1 spray into both nostrils daily.     meloxicam (MOBIC) 15 MG tablet Take 15 mg by mouth daily.     nitrofurantoin, macrocrystal-monohydrate, (MACROBID) 100 MG capsule Take 1 capsule (100 mg total) by mouth 2 (two) times daily. 10 capsule 0   ondansetron (ZOFRAN-ODT) 4 MG disintegrating tablet Take 1 tablet (4 mg total) by mouth every 8 (eight) hours as needed for nausea or vomiting. 20 tablet 0   Oxcarbazepine (TRILEPTAL) 300 MG tablet Take 1 tablet (300 mg total) by mouth 2 (two) times daily. 60 tablet 0   QUEtiapine (SEROQUEL) 400 MG tablet Take 1 tablet (400 mg total) by mouth 2 (two) times daily. 60 tablet 0   sertraline (ZOLOFT) 50 MG tablet Take 3 tablets (150 mg total) by mouth in the morning. 90 tablet 0   traZODone (DESYREL) 100 MG tablet Take 1 tablet (100 mg total) by mouth at bedtime. 30 tablet 0   valACYclovir (VALTREX) 500 MG tablet Take 500 mg by mouth daily.      Labs  Lab Results:  Admission on 10/07/2022  Component Date Value Ref Range Status   Hgb A1c MFr Bld 10/15/2022 5.7 (H)  4.8 - 5.6 % Final   Comment: (NOTE)          Prediabetes: 5.7 - 6.4         Diabetes: >6.4         Glycemic control for adults with diabetes: <7.0    Mean Plasma Glucose 10/15/2022 117  mg/dL Final   Comment: (NOTE) Performed At: [facility] [address] [place], [place] [id number] Jolene SchimkeNagendra Sanjai MD AO:[phone]   Admission on 10/07/2022, Discharged on 10/07/2022  Component Date Value Ref Range Status   Color, Urine 10/07/2022 YELLOW  YELLOW Final   APPearance 10/07/2022 HAZY (A)  CLEAR Final   Specific Gravity, Urine 10/07/2022 1.011  1.005 - 1.030 Final   pH 10/07/2022 7.0  5.0 - 8.0 Final   Glucose, UA 10/07/2022 NEGATIVE  NEGATIVE mg/dL Final   Hgb urine dipstick 10/07/2022 SMALL (A)  NEGATIVE Final   Bilirubin Urine 10/07/2022 NEGATIVE  NEGATIVE Final   Ketones, ur 10/07/2022 NEGATIVE  NEGATIVE mg/dL Final   Protein, ur 96/29/528412/25/2023 NEGATIVE  NEGATIVE mg/dL Final   Nitrite 13/24/401012/25/2023 NEGATIVE  NEGATIVE Final   Leukocytes,Ua 10/07/2022 LARGE (A)  NEGATIVE Final   RBC / HPF 10/07/2022 0-5  0 - 5 RBC/hpf Final   WBC, UA 10/07/2022 0-5  0 - 5 WBC/hpf Final   Bacteria, UA 10/07/2022 FEW (A)  NONE SEEN Final   Squamous Epithelial / HPF 10/07/2022 11-20  0 - 5 Final   Performed at [facility], [address] [place], [place] [address]   I-stat hCG, quantitative 10/07/2022 <5.0  <5 mIU/mL Final   Comment 3 10/07/2022          Final   Comment:   GEST. AGE      CONC.  (mIU/mL)   <=1 WEEK        5 - 50     2 WEEKS       50 - 500     3 WEEKS       100 - 10,000     4 WEEKS  1,000 - 30,000        FEMALE AND NON-PREGNANT FEMALE:     LESS THAN 5 mIU/mL    Sodium 10/07/2022 138  135 - 145 mmol/L Final   Potassium 10/07/2022 4.3  3.5 - 5.1 mmol/L Final   Chloride 10/07/2022 104  98 - 111 mmol/L Final   CO2 10/07/2022 28  22 - 32 mmol/L Final   Glucose, Bld 10/07/2022 90  70 - 99 mg/dL Final   Glucose reference range applies only to samples taken after fasting for at least 8 hours.   BUN 10/07/2022 8  6 - 20  mg/dL Final   Creatinine, Ser 10/07/2022 0.96  0.44 - 1.00 mg/dL Final   Calcium 10/07/2022 9.1  8.9 - 10.3 mg/dL Final   GFR, Estimated 10/07/2022 >60  >60 mL/min Final   Comment: (NOTE) Calculated using the CKD-EPI Creatinine Equation (2021)    Anion gap 10/07/2022 6  5 - 15 Final   Performed at [facility], [place] [address] [place], [place] [address]   WBC 10/07/2022 5.8  4.0 - 10.5 K/uL Final   RBC 10/07/2022 4.36  3.87 - 5.11 MIL/uL Final   Hemoglobin 10/07/2022 12.9  12.0 - 15.0 g/dL Final   HCT 10/07/2022 40.0  36.0 - 46.0 % Final   MCV 10/07/2022 91.7  80.0 - 100.0 fL Final   MCH 10/07/2022 29.6  26.0 - 34.0 pg Final   MCHC 10/07/2022 32.3  30.0 - 36.0 g/dL Final   RDW 10/07/2022 12.4  11.5 - 15.5 % Final   Platelets 10/07/2022 295  150 - 400 K/uL Final   nRBC 10/07/2022 0.0  0.0 - 0.2 % Final   Performed at [facility] [address], [place], [place] [address]   SARS Coronavirus 2 by RT PCR 10/07/2022 NEGATIVE  NEGATIVE Final   Comment: (NOTE) SARS-CoV-2 target nucleic acids are NOT DETECTED.  The SARS-CoV-2 RNA is generally detectable in upper respiratory specimens during the acute phase of infection. The lowest concentration of SARS-CoV-2 viral copies this assay can detect is 138 copies/mL. A negative result does not preclude SARS-Cov-2 infection and should not be used as the sole basis for treatment or other patient management decisions. A negative result may occur with  improper specimen collection/handling, submission of specimen other than nasopharyngeal swab, presence of viral mutation(s) within the areas targeted by this assay, and inadequate number of viral copies(<138 copies/mL). A negative result must be combined with clinical observations, patient history, and epidemiological information. The expected result is Negative.  Fact Sheet for Patients:  EntrepreneurPulse.com.au  Fact Sheet for Healthcare Providers:   IncredibleEmployment.be  This test is no                          t yet approved or cleared by the [place] FDA and  has been authorized for detection and/or diagnosis of SARS-CoV-2 by FDA under an Emergency Use Authorization (EUA). This EUA will remain  in effect (meaning this test can be used) for the duration of the COVID-19 declaration under Section 564(b)(1) of the Act, 21 U.S.C.section 360bbb-3(b)(1), unless the authorization is terminated  or revoked sooner.       Influenza A by PCR 10/07/2022 NEGATIVE  NEGATIVE Final   Influenza B by PCR 10/07/2022 NEGATIVE  NEGATIVE Final   Comment: (NOTE) The Xpert Xpress SARS-CoV-2/FLU/RSV plus assay is intended as an aid in the diagnosis of influenza from Nasopharyngeal swab specimens  and should not be used as a sole basis for treatment. Nasal washings and aspirates are unacceptable for Xpert Xpress SARS-CoV-2/FLU/RSV testing.  Fact Sheet for Patients: EntrepreneurPulse.com.au  Fact Sheet for Healthcare Providers: IncredibleEmployment.be  This test is not yet approved or cleared by the [place] FDA and has been authorized for detection and/or diagnosis of SARS-CoV-2 by FDA under an Emergency Use Authorization (EUA). This EUA will remain in effect (meaning this test can be used) for the duration of the COVID-19 declaration under Section 564(b)(1) of the Act, 21 U.S.C. section 360bbb-3(b)(1), unless the authorization is terminated or revoked.     Resp Syncytial Virus by PCR 10/07/2022 NEGATIVE  NEGATIVE Final   Comment: (NOTE) Fact Sheet for Patients: EntrepreneurPulse.com.au  Fact Sheet for Healthcare Providers: IncredibleEmployment.be  This test is not yet approved or cleared by the [place] FDA and has been authorized for detection and/or diagnosis of SARS-CoV-2 by FDA under an Emergency Use Authorization (EUA). This EUA  will remain in effect (meaning this test can be used) for the duration of the COVID-19 declaration under Section 564(b)(1) of the Act, 21 U.S.C. section 360bbb-3(b)(1), unless the authorization is terminated or revoked.  Performed at [facility], [place] [address] [place], [place] [address]    Specimen Description 10/07/2022 URINE, CLEAN CATCH   Final   Special Requests 10/07/2022    Final                   Value:NONE Performed at [facility], [place] [address], [place], [place] [address]    Culture 10/07/2022 MULTIPLE SPECIES PRESENT, SUGGEST RECOLLECTION (A)   Final   Report Status 10/07/2022 10/08/2022 FINAL   Final  Admission on 07/25/2022, Discharged on 10/07/2022  Component Date Value Ref Range Status   SARS Coronavirus 2 by RT PCR 07/25/2022 NEGATIVE  NEGATIVE Final   Comment: (NOTE) SARS-CoV-2 target nucleic acids are NOT DETECTED.  The SARS-CoV-2 RNA is generally detectable in upper respiratory specimens during the acute phase of infection. The lowest concentration of SARS-CoV-2 viral copies this assay can detect is 138 copies/mL. A negative result does not preclude SARS-Cov-2 infection and should not be used as the sole basis for treatment or other patient management decisions. A negative result may occur with  improper specimen collection/handling, submission of specimen other than nasopharyngeal swab, presence of viral mutation(s) within the areas targeted by this assay, and inadequate number of viral copies(<138 copies/mL). A negative result must be combined with clinical observations, patient history, and epidemiological information. The expected result is Negative.  Fact Sheet for Patients:  EntrepreneurPulse.com.au  Fact Sheet for Healthcare Providers:  IncredibleEmployment.be  This test is no                          t yet approved or cleared by the [place] FDA and  has been authorized for detection and/or  diagnosis of SARS-CoV-2 by FDA under an Emergency Use Authorization (EUA). This EUA will remain  in effect (meaning this test can be used) for the duration of the COVID-19 declaration under Section 564(b)(1) of the Act, 21 U.S.C.section 360bbb-3(b)(1), unless the authorization is terminated  or revoked sooner.       Influenza A by PCR 07/25/2022 NEGATIVE  NEGATIVE Final   Influenza B by PCR 07/25/2022 NEGATIVE  NEGATIVE Final   Comment: (NOTE) The Xpert Xpress SARS-CoV-2/FLU/RSV plus assay is intended as an aid in the diagnosis of influenza from  Nasopharyngeal swab specimens and should not be used as a sole basis for treatment. Nasal washings and aspirates are unacceptable for Xpert Xpress SARS-CoV-2/FLU/RSV testing.  Fact Sheet for Patients: BloggerCourse.comhttps://www.fda.gov/media/152166/download  Fact Sheet for Healthcare Providers: SeriousBroker.ithttps://www.fda.gov/media/152162/download  This test is not yet approved or cleared by the [place] FDA and has been authorized for detection and/or diagnosis of SARS-CoV-2 by FDA under an Emergency Use Authorization (EUA). This EUA will remain in effect (meaning this test can be used) for the duration of the COVID-19 declaration under Section 564(b)(1) of the Act, 21 U.S.C. section 360bbb-3(b)(1), unless the authorization is terminated or revoked.  Performed at [facility], [address] [place], [place] [address]    WBC 07/25/2022 8.3  4.0 - 10.5 K/uL Final   RBC 07/25/2022 4.44  3.87 - 5.11 MIL/uL Final   Hemoglobin 07/25/2022 13.7  12.0 - 15.0 g/dL Final   HCT 60/45/409810/09/2022 40.2  36.0 - 46.0 % Final   MCV 07/25/2022 90.5  80.0 - 100.0 fL Final   MCH 07/25/2022 30.9  26.0 - 34.0 pg Final   MCHC 07/25/2022 34.1  30.0 - 36.0 g/dL Final   RDW 11/91/478210/09/2022 12.2  11.5 - 15.5 % Final   Platelets 07/25/2022 248  150 - 400 K/uL Final   nRBC 07/25/2022 0.0  0.0 - 0.2 % Final   Neutrophils Relative % 07/25/2022 43  % Final   Neutro Abs 07/25/2022 3.6   1.7 - 7.7 K/uL Final   Lymphocytes Relative 07/25/2022 52  % Final   Lymphs Abs 07/25/2022 4.2 (H)  0.7 - 4.0 K/uL Final   Monocytes Relative 07/25/2022 5  % Final   Monocytes Absolute 07/25/2022 0.4  0.1 - 1.0 K/uL Final   Eosinophils Relative 07/25/2022 0  % Final   Eosinophils Absolute 07/25/2022 0.0  0.0 - 0.5 K/uL Final   Basophils Relative 07/25/2022 0  % Final   Basophils Absolute 07/25/2022 0.0  0.0 - 0.1 K/uL Final   Immature Granulocytes 07/25/2022 0  % Final   Abs Immature Granulocytes 07/25/2022 0.02  0.00 - 0.07 K/uL Final   Performed at [facility], [address] [place], [place] [address]   Sodium 07/25/2022 138  135 - 145 mmol/L Final   Potassium 07/25/2022 4.0  3.5 - 5.1 mmol/L Final   Chloride 07/25/2022 104  98 - 111 mmol/L Final   CO2 07/25/2022 29  22 - 32 mmol/L Final   Glucose, Bld 07/25/2022 83  70 - 99 mg/dL Final   Glucose reference range applies only to samples taken after fasting for at least 8 hours.   BUN 07/25/2022 11  6 - 20 mg/dL Final   Creatinine, Ser 07/25/2022 0.97  0.44 - 1.00 mg/dL Final   Calcium 30/86/578410/09/2022 9.2  8.9 - 10.3 mg/dL Final   Total Protein 69/62/952810/09/2022 7.0  6.5 - 8.1 g/dL Final   Albumin 41/32/440110/09/2022 3.8  3.5 - 5.0 g/dL Final   AST 02/72/536610/09/2022 18  15 - 41 U/L Final   ALT 07/25/2022 22  0 - 44 U/L Final   Alkaline Phosphatase 07/25/2022 64  38 - 126 U/L Final   Total Bilirubin 07/25/2022 0.2 (L)  0.3 - 1.2 mg/dL Final   GFR, Estimated 07/25/2022 >60  >60 mL/min Final   Comment: (NOTE) Calculated using the CKD-EPI Creatinine Equation (2021)    Anion gap 07/25/2022 5  5 - 15 Final   Performed at [facility], [address] [place], [place]  [address]   POC Amphetamine UR 07/25/2022 None Detected  NONE DETECTED (Cut Off Level 1000 ng/mL) Preliminary   POC Secobarbital (BAR) 07/25/2022 None Detected  NONE DETECTED (Cut Off Level 300 ng/mL) Preliminary   POC Buprenorphine (BUP) 07/25/2022 None Detected  NONE DETECTED (Cut Off  Level 10 ng/mL) Preliminary   POC Oxazepam (BZO) 07/25/2022 None Detected  NONE DETECTED (Cut Off Level 300 ng/mL) Preliminary   POC Cocaine UR 07/25/2022 None Detected  NONE DETECTED (Cut Off Level 300 ng/mL) Preliminary   POC Methamphetamine UR 07/25/2022 None Detected  NONE DETECTED (Cut Off Level 1000 ng/mL) Preliminary   POC Morphine 07/25/2022 None Detected  NONE DETECTED (Cut Off Level 300 ng/mL) Preliminary   POC Methadone UR 07/25/2022 None Detected  NONE DETECTED (Cut Off Level 300 ng/mL) Preliminary   POC Oxycodone UR 07/25/2022 None Detected  NONE DETECTED (Cut Off Level 100 ng/mL) Preliminary   POC Marijuana UR 07/25/2022 None Detected  NONE DETECTED (Cut Off Level 50 ng/mL) Preliminary   SARSCOV2ONAVIRUS 2 AG 07/25/2022 NEGATIVE  NEGATIVE Final   Comment: (NOTE) SARS-CoV-2 antigen NOT DETECTED.   Negative results are presumptive.  Negative results do not preclude SARS-CoV-2 infection and should not be used as the sole basis for treatment or other patient management decisions, including infection  control decisions, particularly in the presence of clinical signs and  symptoms consistent with COVID-19, or in those who have been in contact with the virus.  Negative results must be combined with clinical observations, patient history, and epidemiological information. The expected result is Negative.  Fact Sheet for Patients: HandmadeRecipes.com.cy  Fact Sheet for Healthcare Providers: FuneralLife.at  This test is not yet approved or cleared by the [place] FDA and  has been authorized for detection and/or diagnosis of SARS-CoV-2 by FDA under an Emergency Use Authorization (EUA).  This EUA will remain in effect (meaning this test can be used) for the duration of  the COV                          ID-19 declaration under Section 564(b)(1) of the Act, 21 U.S.C. section 360bbb-3(b)(1), unless the authorization is terminated or  revoked sooner.     Cholesterol 07/25/2022 181  0 - 200 mg/dL Final   Triglycerides 07/25/2022 118  <150 mg/dL Final   HDL 07/25/2022 45  >40 mg/dL Final   Total CHOL/HDL Ratio 07/25/2022 4.0  RATIO Final   VLDL 07/25/2022 24  0 - 40 mg/dL Final   LDL Cholesterol 07/25/2022 112 (H)  0 - 99 mg/dL Final   Comment:        Total Cholesterol/HDL:CHD Risk Coronary Heart Disease Risk Table                     Men   Women  1/2 Average Risk   3.4   3.3  Average Risk       5.0   4.4  2 X Average Risk   9.6   7.1  3 X Average Risk  23.4   11.0        Use the calculated Patient Ratio above and the CHD Risk Table to determine the patient's CHD Risk.        ATP III CLASSIFICATION (LDL):  <100     mg/dL   Optimal  100-129  mg/dL   Near or Above  Optimal  130-159  mg/dL   Borderline  160-189  mg/dL   High  >190     mg/dL   Very High Performed at [facility] [address] [place], [place] [address]    TSH 07/25/2022 6.668 (H)  0.350 - 4.500 uIU/mL Final   Comment: Performed by a 3rd Generation assay with a functional sensitivity of <=0.01 uIU/mL. Performed at [facility], [place] [address] [place], [place] [address]    Glucose-Capillary 07/26/2022 104 (H)  70 - 99 mg/dL Final   Glucose reference range applies only to samples taken after fasting for at least 8 hours.   T3, Free 07/31/2022 2.3  2.0 - 4.4 pg/mL Final   Comment: (NOTE) Performed At: [facility] [place], [place] [id number] Rush Farmer MD [id number]    Free T4 07/31/2022 0.60 (L)  0.61 - 1.12 ng/dL Final   Comment: (NOTE) Biotin ingestion may interfere with free T4 tests. If the results are inconsistent with the TSH level, previous test results, or the clinical presentation, then consider biotin interference. If needed, order repeat testing after stopping biotin. Performed at [facility], [place] [address], [place], [place] [address]    Glucose-Capillary  08/29/2022 100 (H)  70 - 99 mg/dL Final   Glucose reference range applies only to samples taken after fasting for at least 8 hours.   TSH 09/05/2022 0.793  0.350 - 4.500 uIU/mL Final   Comment: Performed by a 3rd Generation assay with a functional sensitivity of <=0.01 uIU/mL. Performed at [facility], [place] [address], [place], [place] [address]    Free T4 09/05/2022 0.73  0.61 - 1.12 ng/dL Final   Comment: (NOTE) Biotin ingestion may interfere with free T4 tests. If the results are inconsistent with the TSH level, previous test results, or the clinical presentation, then consider biotin interference. If needed, order repeat testing after stopping biotin. Performed at [facility], [place] [address], [place], [place] [address]    Preg Test, Ur 09/06/2022 Negative  Negative Final   Preg Test, Ur 09/06/2022 NEGATIVE  NEGATIVE Final   Comment:        THE SENSITIVITY OF THIS METHODOLOGY IS >24 mIU/mL    Preg Test, Ur 09/05/2022 NEGATIVE  NEGATIVE Final   Comment:        THE SENSITIVITY OF THIS METHODOLOGY IS >24 mIU/mL    Sodium 09/13/2022 136  135 - 145 mmol/L Final   Potassium 09/13/2022 4.5  3.5 - 5.1 mmol/L Final   Chloride 09/13/2022 105  98 - 111 mmol/L Final   CO2 09/13/2022 21 (L)  22 - 32 mmol/L Final   Glucose, Bld 09/13/2022 111 (H)  70 - 99 mg/dL Final   Glucose reference range applies only to samples taken after fasting for at least 8 hours.   BUN 09/13/2022 14  6 - 20 mg/dL Final   Creatinine, Ser 09/13/2022 0.92  0.44 - 1.00 mg/dL Final   Calcium 09/13/2022 9.1  8.9 - 10.3 mg/dL Final   GFR, Estimated 09/13/2022 >60  >60 mL/min Final   Comment: (NOTE) Calculated using the CKD-EPI Creatinine Equation (2021)    Anion gap 09/13/2022 10  5 - 15 Final   Performed at [facility], [place] [address], [place], [place] [address]   Vitamin B-12 09/13/2022 585  180 - 914 pg/mL Final   Comment: (NOTE) This assay is not validated for testing neonatal  or myeloproliferative syndrome  specimens for Vitamin B12 levels. Performed at [facility], [address], [place], [place] [address]    Color, Urine 09/14/2022 YELLOW  YELLOW Final   APPearance 09/14/2022 HAZY (A)  CLEAR Final   Specific Gravity, Urine 09/14/2022 1.025  1.005 - 1.030 Final   pH 09/14/2022 5.0  5.0 - 8.0 Final   Glucose, UA 09/14/2022 NEGATIVE  NEGATIVE mg/dL Final   Hgb urine dipstick 09/14/2022 NEGATIVE  NEGATIVE Final   Bilirubin Urine 09/14/2022 NEGATIVE  NEGATIVE Final   Ketones, ur 09/14/2022 NEGATIVE  NEGATIVE mg/dL Final   Protein, ur 25/00/3704 NEGATIVE  NEGATIVE mg/dL Final   Nitrite 88/89/1694 NEGATIVE  NEGATIVE Final   Leukocytes,Ua 09/14/2022 TRACE (A)  NEGATIVE Final   RBC / HPF 09/14/2022 0-5  0 - 5 RBC/hpf Final   WBC, UA 09/14/2022 0-5  0 - 5 WBC/hpf Final   Bacteria, UA 09/14/2022 RARE (A)  NONE SEEN Final   Squamous Epithelial / HPF 09/14/2022 0-5  0 - 5 Final   Mucus 09/14/2022 PRESENT   Final   Performed at [facility], [address] [place], [place] [address]   Glucose-Capillary 09/16/2022 105 (H)  70 - 99 mg/dL Final   Glucose reference range applies only to samples taken after fasting for at least 8 hours.   SARS Coronavirus 2 by RT PCR 09/30/2022 NEGATIVE  NEGATIVE Final   Comment: (NOTE) SARS-CoV-2 target nucleic acids are NOT DETECTED.  The SARS-CoV-2 RNA is generally detectable in upper respiratory specimens during the acute phase of infection. The lowest concentration of SARS-CoV-2 viral copies this assay can detect is 138 copies/mL. A negative result does not preclude SARS-Cov-2 infection and should not be used as the sole basis for treatment or other patient management decisions. A negative result may occur with  improper specimen collection/handling, submission of specimen other than nasopharyngeal swab, presence of viral mutation(s) within the areas targeted by this assay, and inadequate number of viral copies(<138  copies/mL). A negative result must be combined with clinical observations, patient history, and epidemiological information. The expected result is Negative.  Fact Sheet for Patients:  BloggerCourse.com  Fact Sheet for Healthcare Providers:  SeriousBroker.it  This test is no                          t yet approved or cleared by the [place] FDA and  has been authorized for detection and/or diagnosis of SARS-CoV-2 by FDA under an Emergency Use Authorization (EUA). This EUA will remain  in effect (meaning this test can be used) for the duration of the COVID-19 declaration under Section 564(b)(1) of the Act, 21 U.S.C.section 360bbb-3(b)(1), unless the authorization is terminated  or revoked sooner.       Influenza A by PCR 09/30/2022 NEGATIVE  NEGATIVE Final   Influenza B by PCR 09/30/2022 NEGATIVE  NEGATIVE Final   Comment: (NOTE) The Xpert Xpress SARS-CoV-2/FLU/RSV plus assay is intended as an aid in the diagnosis of influenza from Nasopharyngeal swab specimens and should not be used as a sole basis for treatment. Nasal washings and aspirates are unacceptable for Xpert Xpress SARS-CoV-2/FLU/RSV testing.  Fact Sheet for Patients: BloggerCourse.com  Fact Sheet for Healthcare Providers: SeriousBroker.it  This test is not yet approved or cleared by the [place] FDA and has been authorized for detection and/or diagnosis of SARS-CoV-2 by FDA under an Emergency Use Authorization (EUA). This EUA will remain in effect (meaning this test can be  used) for the duration of the COVID-19 declaration under Section 564(b)(1) of the Act, 21 U.S.C. section 360bbb-3(b)(1), unless the authorization is terminated or revoked.     Resp Syncytial Virus by PCR 09/30/2022 NEGATIVE  NEGATIVE Final   Comment: (NOTE) Fact Sheet for Patients: BloggerCourse.com  Fact  Sheet for Healthcare Providers: SeriousBroker.it  This test is not yet approved or cleared by the [place] FDA and has been authorized for detection and/or diagnosis of SARS-CoV-2 by FDA under an Emergency Use Authorization (EUA). This EUA will remain in effect (meaning this test can be used) for the duration of the COVID-19 declaration under Section 564(b)(1) of the Act, 21 U.S.C. section 360bbb-3(b)(1), unless the authorization is terminated or revoked.  Performed at [facility], [address], [place], [place] [address]    Sodium 10/01/2022 136  135 - 145 mmol/L Final   Potassium 10/01/2022 3.8  3.5 - 5.1 mmol/L Final   Chloride 10/01/2022 104  98 - 111 mmol/L Final   CO2 10/01/2022 27  22 - 32 mmol/L Final   Glucose, Bld 10/01/2022 98  70 - 99 mg/dL Final   Glucose reference range applies only to samples taken after fasting for at least 8 hours.   BUN 10/01/2022 12  6 - 20 mg/dL Final   Creatinine, Ser 10/01/2022 0.98  0.44 - 1.00 mg/dL Final   Calcium 23/55/7322 8.9  8.9 - 10.3 mg/dL Final   GFR, Estimated 10/01/2022 >60  >60 mL/min Final   Comment: (NOTE) Calculated using the CKD-EPI Creatinine Equation (2021)    Anion gap 10/01/2022 5  5 - 15 Final   Performed at [facility], [address] [place], [place] [address]   WBC 10/01/2022 5.1  4.0 - 10.5 K/uL Final   RBC 10/01/2022 4.31  3.87 - 5.11 MIL/uL Final   Hemoglobin 10/01/2022 12.7  12.0 - 15.0 g/dL Final   HCT 70/62/3762 38.8  36.0 - 46.0 % Final   MCV 10/01/2022 90.0  80.0 - 100.0 fL Final   MCH 10/01/2022 29.5  26.0 - 34.0 pg Final   MCHC 10/01/2022 32.7  30.0 - 36.0 g/dL Final   RDW 83/15/1761 12.4  11.5 - 15.5 % Final   Platelets 10/01/2022 300  150 - 400 K/uL Final   nRBC 10/01/2022 0.0  0.0 - 0.2 % Final   Performed at [facility], [address] [place], [place] [address]   Lipase 10/01/2022 29  11 - 51 U/L Final   Performed at [facility],  [address] [place], [place] [address]   Color, Urine 10/05/2022 YELLOW  YELLOW Final   APPearance 10/05/2022 HAZY (A)  CLEAR Final   Specific Gravity, Urine 10/05/2022 1.018  1.005 - 1.030 Final   pH 10/05/2022 5.0  5.0 - 8.0 Final   Glucose, UA 10/05/2022 NEGATIVE  NEGATIVE mg/dL Final   Hgb urine dipstick 10/05/2022 NEGATIVE  NEGATIVE Final   Bilirubin Urine 10/05/2022 NEGATIVE  NEGATIVE Final   Ketones, ur 10/05/2022 NEGATIVE  NEGATIVE mg/dL Final   Protein, ur 94/85/4627 NEGATIVE  NEGATIVE mg/dL Final   Nitrite 03/50/0938 NEGATIVE  NEGATIVE Final   Leukocytes,Ua 10/05/2022 TRACE (A)  NEGATIVE Final   RBC / HPF 10/05/2022 0-5  0 - 5 RBC/hpf Final   WBC, UA 10/05/2022 0-5  0 - 5 WBC/hpf Final   Bacteria, UA 10/05/2022 RARE (A)  NONE SEEN Final   Squamous Epithelial / HPF 10/05/2022 0-5  0 - 5  Final   Mucus 10/05/2022 PRESENT   Final   Performed at [facility], [place] [address], [place], [place] [address]   Specimen Description 10/05/2022 URINE, CLEAN CATCH   Final   Special Requests 10/05/2022    Final                   Value:NONE Performed at [facility], [place] [address] [place], [place] [address]    Culture 10/05/2022 10,000 COLONIES/mL MULTIPLE SPECIES PRESENT, SUGGEST RECOLLECTION (A)   Final   Report Status 10/05/2022 10/07/2022 FINAL   Final  Admission on 07/04/2022, Discharged on 07/05/2022  Component Date Value Ref Range Status   Sodium 07/04/2022 142  135 - 145 mmol/L Final   Potassium 07/04/2022 4.4  3.5 - 5.1 mmol/L Final   Chloride 07/04/2022 109  98 - 111 mmol/L Final   CO2 07/04/2022 26  22 - 32 mmol/L Final   Glucose, Bld 07/04/2022 96  70 - 99 mg/dL Final   Glucose reference range applies only to samples taken after fasting for at least 8 hours.   BUN 07/04/2022 15  6 - 20 mg/dL Final   Creatinine, Ser 07/04/2022 0.83  0.44 - 1.00 mg/dL Final   Calcium 07/04/2022 9.3  8.9 - 10.3 mg/dL Final   Total Protein 07/04/2022 7.2  6.5 - 8.1 g/dL Final    Albumin 07/04/2022 3.9  3.5 - 5.0 g/dL Final   AST 07/04/2022 23  15 - 41 U/L Final   ALT 07/04/2022 28  0 - 44 U/L Final   Alkaline Phosphatase 07/04/2022 77  38 - 126 U/L Final   Total Bilirubin 07/04/2022 0.4  0.3 - 1.2 mg/dL Final   GFR, Estimated 07/04/2022 >60  >60 mL/min Final   Comment: (NOTE) Calculated using the CKD-EPI Creatinine Equation (2021)    Anion gap 07/04/2022 7  5 - 15 Final   Performed at [facility], [place] [address] [place], [place] [address]   Alcohol, Ethyl (B) 07/04/2022 <10  <10 mg/dL Final   Comment: (NOTE) Lowest detectable limit for serum alcohol is 10 mg/dL.  For medical purposes only. Performed at [facility], [place] [address] [place], [place] [address]    WBC 07/04/2022 7.0  4.0 - 10.5 K/uL Final   RBC 07/04/2022 4.18  3.87 - 5.11 MIL/uL Final   Hemoglobin 07/04/2022 12.8  12.0 - 15.0 g/dL Final   HCT 07/04/2022 39.1  36.0 - 46.0 % Final   MCV 07/04/2022 93.5  80.0 - 100.0 fL Final   MCH 07/04/2022 30.6  26.0 - 34.0 pg Final   MCHC 07/04/2022 32.7  30.0 - 36.0 g/dL Final   RDW 07/04/2022 12.9  11.5 - 15.5 % Final   Platelets 07/04/2022 243  150 - 400 K/uL Final   nRBC 07/04/2022 0.0  0.0 - 0.2 % Final   Neutrophils Relative % 07/04/2022 43  % Final   Neutro Abs 07/04/2022 3.0  1.7 - 7.7 K/uL Final   Lymphocytes Relative 07/04/2022 50  % Final   Lymphs Abs 07/04/2022 3.5  0.7 - 4.0 K/uL Final   Monocytes Relative 07/04/2022 7  % Final   Monocytes Absolute 07/04/2022 0.5  0.1 - 1.0 K/uL Final   Eosinophils Relative 07/04/2022 0  % Final   Eosinophils Absolute 07/04/2022 0.0  0.0 - 0.5 K/uL Final   Basophils Relative 07/04/2022 0  % Final   Basophils Absolute 07/04/2022 0.0  0.0 - 0.1 K/uL Final  Immature Granulocytes 07/04/2022 0  % Final   Abs Immature Granulocytes 07/04/2022 0.01  0.00 - 0.07 K/uL Final   Performed at [facility], [place] [address], [place], [place] [address]   I-stat  hCG, quantitative 07/04/2022 <5.0  <5 mIU/mL Final   Comment 3 07/04/2022          Final   Comment:   GEST. AGE      CONC.  (mIU/mL)   <=1 WEEK        5 - 50     2 WEEKS       50 - 500     3 WEEKS       100 - 10,000     4 WEEKS     1,000 - 30,000        FEMALE AND NON-PREGNANT FEMALE:     LESS THAN 5 mIU/mL   Admission on 06/14/2022, Discharged on 06/14/2022  Component Date Value Ref Range Status   Color, UA 06/14/2022 yellow  yellow Final   Clarity, UA 06/14/2022 cloudy (A)  clear Final   Glucose, UA 06/14/2022 negative  negative mg/dL Final   Bilirubin, UA 06/14/2022 negative  negative Final   Ketones, POC UA 06/14/2022 negative  negative mg/dL Final   Spec Grav, UA 06/14/2022 1.025  1.010 - 1.025 Final   Blood, UA 06/14/2022 negative  negative Final   pH, UA 06/14/2022 7.5  5.0 - 8.0 Final   Protein Ur, POC 06/14/2022 =30 (A)  negative mg/dL Final   Urobilinogen, UA 06/14/2022 0.2  0.2 or 1.0 E.U./dL Final   Nitrite, UA 06/14/2022 Negative  Negative Final   Leukocytes, UA 06/14/2022 Negative  Negative Final   Preg Test, Ur 06/14/2022 Negative  Negative Final   Specimen Description 06/14/2022 URINE, CLEAN CATCH   Final   Special Requests 06/14/2022    Final                   Value:NONE Performed at [facility], [place] [address] [place], [place] [address]    Culture 06/14/2022 MULTIPLE SPECIES PRESENT, SUGGEST RECOLLECTION (A)   Final   Report Status 06/14/2022 06/16/2022 FINAL   Final  Admission on 06/07/2022, Discharged on 06/10/2022  Component Date Value Ref Range Status   SARS Coronavirus 2 by RT PCR 06/07/2022 NEGATIVE  NEGATIVE Final   Comment: (NOTE) SARS-CoV-2 target nucleic acids are NOT DETECTED.  The SARS-CoV-2 RNA is generally detectable in upper respiratory specimens during the acute phase of infection. The lowest concentration of SARS-CoV-2 viral copies this assay can detect is 138 copies/mL. A negative result does not preclude SARS-Cov-2 infection and  should not be used as the sole basis for treatment or other patient management decisions. A negative result may occur with  improper specimen collection/handling, submission of specimen other than nasopharyngeal swab, presence of viral mutation(s) within the areas targeted by this assay, and inadequate number of viral copies(<138 copies/mL). A negative result must be combined with clinical observations, patient history, and epidemiological information. The expected result is Negative.  Fact Sheet for Patients:  EntrepreneurPulse.com.au  Fact Sheet for Healthcare Providers:  IncredibleEmployment.be  This test is no                          t yet approved or cleared by the [place] FDA and  has been authorized for detection and/or diagnosis of SARS-CoV-2 by FDA under an Emergency Use Authorization (EUA). This  EUA will remain  in effect (meaning this test can be used) for the duration of the COVID-19 declaration under Section 564(b)(1) of the Act, 21 U.S.C.section 360bbb-3(b)(1), unless the authorization is terminated  or revoked sooner.       Influenza A by PCR 06/07/2022 NEGATIVE  NEGATIVE Final   Influenza B by PCR 06/07/2022 NEGATIVE  NEGATIVE Final   Comment: (NOTE) The Xpert Xpress SARS-CoV-2/FLU/RSV plus assay is intended as an aid in the diagnosis of influenza from Nasopharyngeal swab specimens and should not be used as a sole basis for treatment. Nasal washings and aspirates are unacceptable for Xpert Xpress SARS-CoV-2/FLU/RSV testing.  Fact Sheet for Patients: EntrepreneurPulse.com.au  Fact Sheet for Healthcare Providers: IncredibleEmployment.be  This test is not yet approved or cleared by the [place] FDA and has been authorized for detection and/or diagnosis of SARS-CoV-2 by FDA under an Emergency Use Authorization (EUA). This EUA will remain in effect (meaning this test can be used)  for the duration of the COVID-19 declaration under Section 564(b)(1) of the Act, 21 U.S.C. section 360bbb-3(b)(1), unless the authorization is terminated or revoked.  Performed at [facility], [place] [address] [place], [place] [address]    WBC 06/07/2022 5.7  4.0 - 10.5 K/uL Final   RBC 06/07/2022 4.39  3.87 - 5.11 MIL/uL Final   Hemoglobin 06/07/2022 13.2  12.0 - 15.0 g/dL Final   HCT 06/07/2022 40.4  36.0 - 46.0 % Final   MCV 06/07/2022 92.0  80.0 - 100.0 fL Final   MCH 06/07/2022 30.1  26.0 - 34.0 pg Final   MCHC 06/07/2022 32.7  30.0 - 36.0 g/dL Final   RDW 06/07/2022 12.4  11.5 - 15.5 % Final   Platelets 06/07/2022 308  150 - 400 K/uL Final   nRBC 06/07/2022 0.0  0.0 - 0.2 % Final   Neutrophils Relative % 06/07/2022 42  % Final   Neutro Abs 06/07/2022 2.4  1.7 - 7.7 K/uL Final   Lymphocytes Relative 06/07/2022 54  % Final   Lymphs Abs 06/07/2022 3.1  0.7 - 4.0 K/uL Final   Monocytes Relative 06/07/2022 4  % Final   Monocytes Absolute 06/07/2022 0.2  0.1 - 1.0 K/uL Final   Eosinophils Relative 06/07/2022 0  % Final   Eosinophils Absolute 06/07/2022 0.0  0.0 - 0.5 K/uL Final   Basophils Relative 06/07/2022 0  % Final   Basophils Absolute 06/07/2022 0.0  0.0 - 0.1 K/uL Final   Immature Granulocytes 06/07/2022 0  % Final   Abs Immature Granulocytes 06/07/2022 0.01  0.00 - 0.07 K/uL Final   Performed at [facility], [place] [address] [place], [place] [address]   Sodium 06/07/2022 139  135 - 145 mmol/L Final   Potassium 06/07/2022 4.0  3.5 - 5.1 mmol/L Final   Chloride 06/07/2022 104  98 - 111 mmol/L Final   CO2 06/07/2022 28  22 - 32 mmol/L Final   Glucose, Bld 06/07/2022 104 (H)  70 - 99 mg/dL Final   Glucose reference range applies only to samples taken after fasting for at least 8 hours.   BUN 06/07/2022 8  6 - 20 mg/dL Final   Creatinine, Ser 06/07/2022 0.84  0.44 - 1.00 mg/dL Final   Calcium 06/07/2022 9.1  8.9 - 10.3 mg/dL Final   Total Protein 06/07/2022 6.9   6.5 - 8.1 g/dL Final   Albumin 06/07/2022 3.7  3.5 - 5.0 g/dL Final   AST 06/07/2022 19  15 - 41  U/L Final   ALT 06/07/2022 22  0 - 44 U/L Final   Alkaline Phosphatase 06/07/2022 54  38 - 126 U/L Final   Total Bilirubin 06/07/2022 0.4  0.3 - 1.2 mg/dL Final   GFR, Estimated 06/07/2022 >60  >60 mL/min Final   Comment: (NOTE) Calculated using the CKD-EPI Creatinine Equation (2021)    Anion gap 06/07/2022 7  5 - 15 Final   Performed at [facility], [place] [address] [place], [place] [address]   Hgb A1c MFr Bld 06/07/2022 5.0  4.8 - 5.6 % Final   Comment: (NOTE) Pre diabetes:          5.7%-6.4%  Diabetes:              >6.4%  Glycemic control for   <7.0% adults with diabetes    Mean Plasma Glucose 06/07/2022 96.8  mg/dL Final   Performed at [facility], [place] [address] [place], [place] [address]   TSH 06/07/2022 1.620  0.350 - 4.500 uIU/mL Final   Comment: Performed by a 3rd Generation assay with a functional sensitivity of <=0.01 uIU/mL. Performed at [facility], [place] [address], [place], [place] [address]    RPR Ser Ql 06/07/2022 NON REACTIVE  NON REACTIVE Final   Performed at [facility], [place] [address] [place], [place] [address]   Color, Urine 06/07/2022 YELLOW  YELLOW Final   APPearance 06/07/2022 HAZY (A)  CLEAR Final   Specific Gravity, Urine 06/07/2022 1.018  1.005 - 1.030 Final   pH 06/07/2022 7.0  5.0 - 8.0 Final   Glucose, UA 06/07/2022 NEGATIVE  NEGATIVE mg/dL Final   Hgb urine dipstick 06/07/2022 NEGATIVE  NEGATIVE Final   Bilirubin Urine 06/07/2022 NEGATIVE  NEGATIVE Final   Ketones, ur 06/07/2022 NEGATIVE  NEGATIVE mg/dL Final   Protein, ur 06/07/2022 NEGATIVE  NEGATIVE mg/dL Final   Nitrite 06/07/2022 NEGATIVE  NEGATIVE Final   Leukocytes,Ua 06/07/2022 NEGATIVE  NEGATIVE Final   Performed at [facility], [place] [address], [place], [place] [address]   Cholesterol 06/07/2022 164  0 - 200 mg/dL Final   Triglycerides 06/07/2022 158 (H)   <150 mg/dL Final   HDL 06/07/2022 44  >40 mg/dL Final   Total CHOL/HDL Ratio 06/07/2022 3.7  RATIO Final   VLDL 06/07/2022 32  0 - 40 mg/dL Final   LDL Cholesterol 06/07/2022 88  0 - 99 mg/dL Final   Comment:        Total Cholesterol/HDL:CHD Risk Coronary Heart Disease Risk Table                     Men   Women  1/2 Average Risk   3.4   3.3  Average Risk       5.0   4.4  2 X Average Risk   9.6   7.1  3 X Average Risk  23.4   11.0        Use the calculated Patient Ratio above and the CHD Risk Table to determine the patient's CHD Risk.        ATP III CLASSIFICATION (LDL):  <100     mg/dL   Optimal  100-129  mg/dL   Near or Above                    Optimal  130-159  mg/dL   Borderline  160-189  mg/dL   High  >190     mg/dL  Very High Performed at [facility], [place] [address] [place], [place] [address]    HIV Screen 4th Generation wRfx 06/07/2022 Non Reactive  Non Reactive Final   Performed at [facility], [place] [address] [place], [place] [address]   SARSCOV2ONAVIRUS 2 AG 06/07/2022 NEGATIVE  NEGATIVE Final   Comment: (NOTE) SARS-CoV-2 antigen NOT DETECTED.   Negative results are presumptive.  Negative results do not preclude SARS-CoV-2 infection and should not be used as the sole basis for treatment or other patient management decisions, including infection  control decisions, particularly in the presence of clinical signs and  symptoms consistent with COVID-19, or in those who have been in contact with the virus.  Negative results must be combined with clinical observations, patient history, and epidemiological information. The expected result is Negative.  Fact Sheet for Patients: HandmadeRecipes.com.cy  Fact Sheet for Healthcare Providers: FuneralLife.at  This test is not yet approved or cleared by the [place] FDA and  has been authorized for detection and/or diagnosis of SARS-CoV-2 by FDA under an  Emergency Use Authorization (EUA).  This EUA will remain in effect (meaning this test can be used) for the duration of  the COV                          ID-19 declaration under Section 564(b)(1) of the Act, 21 U.S.C. section 360bbb-3(b)(1), unless the authorization is terminated or revoked sooner.     POC Amphetamine UR 06/07/2022 None Detected  NONE DETECTED (Cut Off Level 1000 ng/mL) Final   POC Secobarbital (BAR) 06/07/2022 None Detected  NONE DETECTED (Cut Off Level 300 ng/mL) Final   POC Buprenorphine (BUP) 06/07/2022 None Detected  NONE DETECTED (Cut Off Level 10 ng/mL) Final   POC Oxazepam (BZO) 06/07/2022 None Detected  NONE DETECTED (Cut Off Level 300 ng/mL) Final   POC Cocaine UR 06/07/2022 None Detected  NONE DETECTED (Cut Off Level 300 ng/mL) Final   POC Methamphetamine UR 06/07/2022 None Detected  NONE DETECTED (Cut Off Level 1000 ng/mL) Final   POC Morphine 06/07/2022 None Detected  NONE DETECTED (Cut Off Level 300 ng/mL) Final   POC Methadone UR 06/07/2022 None Detected  NONE DETECTED (Cut Off Level 300 ng/mL) Final   POC Oxycodone UR 06/07/2022 None Detected  NONE DETECTED (Cut Off Level 100 ng/mL) Final   POC Marijuana UR 06/07/2022 None Detected  NONE DETECTED (Cut Off Level 50 ng/mL) Final   Preg Test, Ur 06/08/2022 NEGATIVE  NEGATIVE Final   Comment:        THE SENSITIVITY OF THIS METHODOLOGY IS >20 mIU/mL. Performed at [facility], [place] [address] [place], [place] [address]    Preg Test, Ur 06/08/2022 NEGATIVE  NEGATIVE Final   Comment:        THE SENSITIVITY OF THIS METHODOLOGY IS >24 mIU/mL     Blood Alcohol level:  Lab Results  Component Value Date   ETH <10 07/04/2022   ETH <10 [id number]    Metabolic Disorder Labs: Lab Results  Component Value Date   HGBA1C 5.7 (H) 10/15/2022   MPG 117 10/15/2022   MPG 96.8 06/07/2022   No results found for: "PROLACTIN" Lab Results  Component Value Date   CHOL 181 07/25/2022   TRIG 118 07/25/2022    HDL 45 07/25/2022   CHOLHDL 4.0 07/25/2022   VLDL 24 07/25/2022   LDLCALC 112 (H) 07/25/2022   LDLCALC 88 06/07/2022    Therapeutic  Lab Levels: No results found for: "LITHIUM" No results found for: "VALPROATE" No results found for: "CBMZ"  Physical Findings   GAD-7    [place] Office Visit from 12/17/2021 in [place]  Total GAD-7 Score 17      PHQ2-9    [place] ED from 06/07/2022 in [facility] Office Visit from 12/17/2021 in [place]  PHQ-2 Total Score 1 2  PHQ-9 Total Score 2 8      Flowsheet Row ED from 10/07/2022 in [facility] Most recent reading at 10/20/2022 11:04 AM ED from 10/07/2022 in [place] Most recent reading at 10/07/2022 12:18 PM ED from 07/25/2022 in [facility] Most recent reading at 10/06/2022  9:38 AM  C-SSRS RISK CATEGORY No Risk No Risk No Risk        Musculoskeletal  Strength & Muscle Tone: within normal limits Gait & Station: normal Patient leans: N/A  Psychiatric Specialty Exam  Presentation  General Appearance:  Appropriate for Environment; Casual  Eye Contact: Good  Speech: Clear and Coherent; Normal Rate  Speech Volume: Normal  Handedness: Right   Mood and Affect  Mood: Euthymic  Affect: Congruent   Thought Process  Thought Processes: Coherent  Descriptions of Associations:Intact  Orientation:Full (Time, Place and Person)  Thought Content:Logical  Diagnosis of Schizophrenia or Schizoaffective disorder in past: No    Hallucinations:denied  Ideas of Reference:None  Suicidal Thoughts:denied Homicidal Thoughts:denied   Sensorium  Memory: Immediate Good; Recent Good; Remote Good  Judgment: Good  Insight: Good   Executive Functions  Concentration: Good  Attention Span: Good  Recall: Good  Fund of  Knowledge: Good  Language: Good   Psychomotor Activity  Psychomotor Activity: normal   Assets  Assets: Communication Skills; Desire for Improvement; Leisure Time; Physical Health; Resilience; Social Support   Sleep  Sleep: good  Physical Exam  Physical Exam Vitals and nursing note reviewed.  Constitutional:      General: She is not in acute distress.    Appearance: She is not ill-appearing or diaphoretic.  HENT:     Head: Normocephalic and atraumatic.  Pulmonary:     Effort: Pulmonary effort is normal. No respiratory distress.  Neurological:     General: No focal deficit present.     Mental Status: She is alert and oriented to person, place, and time.    Review of Systems  HENT:  Negative for ear pain (improving).   Respiratory:  Negative for shortness of breath.   Cardiovascular:  Negative for chest pain.  Gastrointestinal:  Negative for abdominal pain, constipation, diarrhea, nausea and vomiting.  Musculoskeletal:  Positive for back pain (due to recliner).  Neurological:  Negative for dizziness, tremors and headaches.   Blood pressure 100/67, pulse 83, temperature (!) 97.5 F (36.4 C), temperature source Oral, resp. rate 18, height 5\' 1"  (1.549 m), weight 198 lb (89.8 kg), SpO2 100 %. Body mass index is 37.41 kg/m.  Treatment Plan Summary: Plan Patient continues to be psychiatrically cleared, stable and ready for discharge.  Social work/TOC, DSS to continue seeking appropriate placement for patient.    Merrily Brittle, DO 10/23/2022 9:47 AM

## 2022-10-23 NOTE — ED Notes (Signed)
Pt asleep at this hour. No apparent distress. RR even and unlabored. Monitored for safety.  

## 2022-10-23 NOTE — ED Notes (Signed)
Pt sleeping@this time. Breathing even and unlabored. Will continue to monitor for safety 

## 2022-10-24 DIAGNOSIS — F333 Major depressive disorder, recurrent, severe with psychotic symptoms: Secondary | ICD-10-CM | POA: Diagnosis not present

## 2022-10-24 DIAGNOSIS — E039 Hypothyroidism, unspecified: Secondary | ICD-10-CM | POA: Diagnosis not present

## 2022-10-24 DIAGNOSIS — N390 Urinary tract infection, site not specified: Secondary | ICD-10-CM | POA: Diagnosis not present

## 2022-10-24 DIAGNOSIS — Z1152 Encounter for screening for COVID-19: Secondary | ICD-10-CM | POA: Diagnosis not present

## 2022-10-24 DIAGNOSIS — N9489 Other specified conditions associated with female genital organs and menstrual cycle: Secondary | ICD-10-CM | POA: Diagnosis not present

## 2022-10-24 DIAGNOSIS — E118 Type 2 diabetes mellitus with unspecified complications: Secondary | ICD-10-CM | POA: Diagnosis not present

## 2022-10-24 DIAGNOSIS — L304 Erythema intertrigo: Secondary | ICD-10-CM | POA: Diagnosis not present

## 2022-10-24 DIAGNOSIS — F209 Schizophrenia, unspecified: Secondary | ICD-10-CM | POA: Diagnosis not present

## 2022-10-24 DIAGNOSIS — F419 Anxiety disorder, unspecified: Secondary | ICD-10-CM | POA: Diagnosis not present

## 2022-10-24 DIAGNOSIS — F17200 Nicotine dependence, unspecified, uncomplicated: Secondary | ICD-10-CM | POA: Diagnosis not present

## 2022-10-24 MED ORDER — ZINC OXIDE 11.3 % EX CREA: TOPICAL_CREAM | CUTANEOUS | Status: DC | PRN

## 2022-10-24 MED ORDER — KETOCONAZOLE 2 % EX CREA
TOPICAL_CREAM | Freq: Two times a day (BID) | CUTANEOUS | Status: DC
  Administered 2022-10-30 – 2022-11-21 (×8): 1 via TOPICAL
  Filled 2022-10-24 (×4): qty 15

## 2022-10-24 NOTE — ED Provider Notes (Signed)
Behavioral Health Progress Note  Date and Time: 10/24/2022 1:06 PM Name: Nicole [name] MRN:  [id number]  HPI Nicole [name] 32 y.o., female patient with schizophrenia, MDD with psychosis,  presented to [facility], voluntarily, as a walk-in accompanied by [employer] after a disagreement and verbal altercation with personnel at the group home.  She has been dismissed previously from other group homes due to threatening behaviors, and suicidal ideation and the one which she was residing in.  Patient was admitted to continuous assessment unit on 07/25/2022 as she also endorsed passive SI at that time along with symptoms of visual hallucination. Patient was subsequently psychiatrically cleared during her admission to the continuous assessment unit.  However patient remains here at [facility] bordering until adequate- residential placement can be obtained.    Legal Guardian: [name]) transitioning to be [name]) Point of contact: [name])   Tests administered: Wechsler Adult Intelligence Scale-4 (see Eloise Harman, PhD consult note on 08/19/2022 for full info) Vin[place]-3 Adaptive Behavior Scales Assessment completed on 09/13/2022 administered by Rosezetta Schlatter, MD  Subjective:   Patient was initially seen awake, no acute distress. She was engaged, pleasant and cooperative with evaluation. Patient reported being bored, but that her mood is good. She noted that she her skin is raw under her breast. Reported that she has been sweaty around her breast. Denied bleeding, pus, or any discharge at the site. Denied pain, just discomfort. She continues to tolerate medications well, denied side effects. Endorsed stable and appropriate sleep and appetite.  Patient denied SI/HI/AVH, paranoia, and contracted to safety. Patient had no other questions or concerns and was amenable to plan.   Diagnosis:  Final diagnoses:  Tobacco use disorder  Schizophrenia, unspecified type ([place])  MDD (major depressive  disorder), recurrent episode, moderate ([facility])  Hypothyroidism, unspecified type    Total Time spent with patient: 15 minutes  Past Psychiatric History: schizoaffective, borderline IDD, anxiety, and MDD  Past Medical History:  Past Medical History:  Diagnosis Date   Anxiety    Depression    Hypothyroidism 08/07/2022   Tobacco use disorder 08/07/2022    Past Surgical History:  Procedure Laterality Date   WISDOM TOOTH EXTRACTION Bilateral 2020   Family History:  Family History  Problem Relation Age of Onset   Hypertension Father    Diabetes Father    Family Psychiatric  History: None reported Social History:  Social History   Substance and Sexual Activity  Alcohol Use Never     Social History   Substance and Sexual Activity  Drug Use Never    Social History   Socioeconomic History   Marital status: Single    Spouse name: Not on file   Number of children: Not on file   Years of education: Not on file   Highest education level: Not on file  Occupational History   Not on file  Tobacco Use   Smoking status: Every Day    Types: Cigarettes   Smokeless tobacco: Never  Vaping Use   Vaping Use: Some days  Substance and Sexual Activity   Alcohol use: Never   Drug use: Never   Sexual activity: Yes    Partners: Female    Birth control/protection: Implant  Other Topics Concern   Not on file  Social History Narrative   Not on file   Social Determinants of Health   Financial Resource Strain: Not on file  Food Insecurity: Not on file  Transportation Needs: Not on file  Physical Activity: Not on file  Stress: Not on file  Social Connections: Not on file   SDOH:  SDOH Screenings   Depression (PHQ2-9): Low Risk  (06/10/2022)  Tobacco Use: High Risk (10/21/2022)   Additional Social History:     Sleep: Good  Appetite:  Good  Current Medications:  Current Facility-Administered Medications  Medication Dose Route Frequency Provider Last Rate Last Admin    acetaminophen (TYLENOL) tablet 650 mg  650 mg Oral Q6H PRN Haynes Kerns, NP       alum & mag hydroxide-simeth (MAALOX/MYLANTA) 200-200-20 MG/5ML suspension 30 mL  30 mL Oral Q4H PRN Haynes Kerns, NP   30 mL at 10/20/22 2201   [START ON 10/25/2022] ARIPiprazole ER (ABILIFY MAINTENA) 400 MG prefilled syringe 400 mg  400 mg Intramuscular Q28 days Merrily Brittle, DO       cetaphil lotion   Topical PRN Merrily Brittle, DO       fluticasone (FLONASE) 50 MCG/ACT nasal spray 1 spray  1 spray Each Nare QHS Merrily Brittle, DO   1 spray at 10/23/22 2110   hydrOXYzine (ATARAX) tablet 25 mg  25 mg Oral TID PRN Christene Slates, MD   25 mg at 10/23/22 2110   levothyroxine (SYNTHROID) tablet 100 mcg  100 mcg Oral Once Byungura, Veronique M, NP       levothyroxine (SYNTHROID) tablet 100 mcg  100 mcg Oral Q0600 Haynes Kerns, NP   100 mcg at 10/24/22 0607   lidocaine (LIDODERM) 5 % 1 patch  1 patch Transdermal Q24H Merrily Brittle, DO   1 patch at 10/23/22 1109   loratadine (CLARITIN) tablet 10 mg  10 mg Oral Daily Carrion-Carrero, Margely, MD   10 mg at 10/24/22 1010   magnesium hydroxide (MILK OF MAGNESIA) suspension 30 mL  30 mL Oral Daily PRN Maple Hudson, Veronique M, NP   30 mL at 10/12/22 2145   metFORMIN (GLUCOPHAGE) tablet 500 mg  500 mg Oral Q breakfast Carrion-Carrero, Margely, MD   500 mg at 10/24/22 1011   nicotine (NICODERM CQ - dosed in mg/24 hours) patch 14 mg  14 mg Transdermal Daily PRN Carrion-Carrero, Caryl Ada, MD       ondansetron (ZOFRAN-ODT) disintegrating tablet 4 mg  4 mg Oral Q8H PRN Carrion-Carrero, Margely, MD   4 mg at 10/22/22 1806   Oxcarbazepine (TRILEPTAL) tablet 300 mg  300 mg Oral BID Byungura, Veronique M, NP   300 mg at 10/24/22 1010   pantoprazole (PROTONIX) EC tablet 40 mg  40 mg Oral Daily Carrion-Carrero, Margely, MD   40 mg at 10/24/22 1011   polyethylene glycol (MIRALAX / GLYCOLAX) packet 17 g  17 g Oral Daily Merrily Brittle, DO   17 g at 10/24/22 1011    QUEtiapine (SEROQUEL) tablet 400 mg  400 mg Oral BID Byungura, Veronique M, NP   400 mg at 10/24/22 1011   sertraline (ZOLOFT) tablet 150 mg  150 mg Oral Daily Carrion-Carrero, Margely, MD   150 mg at 10/24/22 1010   sodium chloride (OCEAN) 0.65 % nasal spray 1 spray  1 spray Each Nare Daily Merrily Brittle, DO   1 spray at 10/24/22 1012   traZODone (DESYREL) tablet 100 mg  100 mg Oral QHS Ronelle Nigh M, NP   100 mg at 10/23/22 2110   traZODone (DESYREL) tablet 50 mg  50 mg Oral QHS PRN Haynes Kerns, NP   50 mg at 10/21/22 2202   valACYclovir (VALTREX) tablet 500 mg  500 mg  Oral Daily Carrion-Carrero, Margely, MD   500 mg at 10/24/22 1011   Current Outpatient Medications  Medication Sig Dispense Refill   ABILIFY MAINTENA 400 MG PRSY prefilled syringe 400 mg every 28 (twenty-eight) days.     cetirizine (ZYRTEC) 10 MG tablet Take 10 mg by mouth daily.     cyclobenzaprine (FLEXERIL) 10 MG tablet Take 1 tablet (10 mg total) by mouth 2 (two) times daily as needed for muscle spasms. 20 tablet 0   fluticasone (FLONASE) 50 MCG/ACT nasal spray Place 1 spray into both nostrils daily.     meloxicam (MOBIC) 15 MG tablet Take 15 mg by mouth daily.     nitrofurantoin, macrocrystal-monohydrate, (MACROBID) 100 MG capsule Take 1 capsule (100 mg total) by mouth 2 (two) times daily. 10 capsule 0   ondansetron (ZOFRAN-ODT) 4 MG disintegrating tablet Take 1 tablet (4 mg total) by mouth every 8 (eight) hours as needed for nausea or vomiting. 20 tablet 0   Oxcarbazepine (TRILEPTAL) 300 MG tablet Take 1 tablet (300 mg total) by mouth 2 (two) times daily. 60 tablet 0   QUEtiapine (SEROQUEL) 400 MG tablet Take 1 tablet (400 mg total) by mouth 2 (two) times daily. 60 tablet 0   sertraline (ZOLOFT) 50 MG tablet Take 3 tablets (150 mg total) by mouth in the morning. 90 tablet 0   traZODone (DESYREL) 100 MG tablet Take 1 tablet (100 mg total) by mouth at bedtime. 30 tablet 0   valACYclovir (VALTREX) 500 MG  tablet Take 500 mg by mouth daily.      Labs  Lab Results:  Admission on 10/07/2022  Component Date Value Ref Range Status   Hgb A1c MFr Bld 10/15/2022 5.7 (H)  4.8 - 5.6 % Final   Comment: (NOTE)         Prediabetes: 5.7 - 6.4         Diabetes: >6.4         Glycemic control for adults with diabetes: <7.0    Mean Plasma Glucose 10/15/2022 117  mg/dL Final   Comment: (NOTE) Performed At: [facility] [address] [place], [place] [id number] Jolene Schimke MD IO:[id number]   Admission on 10/07/2022, Discharged on 10/07/2022  Component Date Value Ref Range Status   Color, Urine 10/07/2022 YELLOW  YELLOW Final   APPearance 10/07/2022 HAZY (A)  CLEAR Final   Specific Gravity, Urine 10/07/2022 1.011  1.005 - 1.030 Final   pH 10/07/2022 7.0  5.0 - 8.0 Final   Glucose, UA 10/07/2022 NEGATIVE  NEGATIVE mg/dL Final   Hgb urine dipstick 10/07/2022 SMALL (A)  NEGATIVE Final   Bilirubin Urine 10/07/2022 NEGATIVE  NEGATIVE Final   Ketones, ur 10/07/2022 NEGATIVE  NEGATIVE mg/dL Final   Protein, ur 24/40/1027 NEGATIVE  NEGATIVE mg/dL Final   Nitrite 25/36/6440 NEGATIVE  NEGATIVE Final   Leukocytes,Ua 10/07/2022 LARGE (A)  NEGATIVE Final   RBC / HPF 10/07/2022 0-5  0 - 5 RBC/hpf Final   WBC, UA 10/07/2022 0-5  0 - 5 WBC/hpf Final   Bacteria, UA 10/07/2022 FEW (A)  NONE SEEN Final   Squamous Epithelial / HPF 10/07/2022 11-20  0 - 5 Final   Performed at [facility], [address] [place], [place] [address]   I-stat hCG, quantitative 10/07/2022 <5.0  <5 mIU/mL Final   Comment 3 10/07/2022          Final   Comment:   GEST. AGE      CONC.  (mIU/mL)   <=1  WEEK        5 - 50     2 WEEKS       50 - 500     3 WEEKS       100 - 10,000     4 WEEKS     1,000 - 30,000        FEMALE AND NON-PREGNANT FEMALE:     LESS THAN 5 mIU/mL    Sodium 10/07/2022 138  135 - 145 mmol/L Final   Potassium 10/07/2022 4.3  3.5 - 5.1 mmol/L Final   Chloride 10/07/2022 104  98 - 111 mmol/L Final    CO2 10/07/2022 28  22 - 32 mmol/L Final   Glucose, Bld 10/07/2022 90  70 - 99 mg/dL Final   Glucose reference range applies only to samples taken after fasting for at least 8 hours.   BUN 10/07/2022 8  6 - 20 mg/dL Final   Creatinine, Ser 10/07/2022 0.96  0.44 - 1.00 mg/dL Final   Calcium 45/12/8880 9.1  8.9 - 10.3 mg/dL Final   GFR, Estimated 10/07/2022 >60  >60 mL/min Final   Comment: (NOTE) Calculated using the CKD-EPI Creatinine Equation (2021)    Anion gap 10/07/2022 6  5 - 15 Final   Performed at [facility], [address] [place], [place] [address]   WBC 10/07/2022 5.8  4.0 - 10.5 K/uL Final   RBC 10/07/2022 4.36  3.87 - 5.11 MIL/uL Final   Hemoglobin 10/07/2022 12.9  12.0 - 15.0 g/dL Final   HCT 91/79/1505 40.0  36.0 - 46.0 % Final   MCV 10/07/2022 91.7  80.0 - 100.0 fL Final   MCH 10/07/2022 29.6  26.0 - 34.0 pg Final   MCHC 10/07/2022 32.3  30.0 - 36.0 g/dL Final   RDW 69/79/4801 12.4  11.5 - 15.5 % Final   Platelets 10/07/2022 295  150 - 400 K/uL Final   nRBC 10/07/2022 0.0  0.0 - 0.2 % Final   Performed at [facility], [address], [place], [place] [address]   SARS Coronavirus 2 by RT PCR 10/07/2022 NEGATIVE  NEGATIVE Final   Comment: (NOTE) SARS-CoV-2 target nucleic acids are NOT DETECTED.  The SARS-CoV-2 RNA is generally detectable in upper respiratory specimens during the acute phase of infection. The lowest concentration of SARS-CoV-2 viral copies this assay can detect is 138 copies/mL. A negative result does not preclude SARS-Cov-2 infection and should not be used as the sole basis for treatment or other patient management decisions. A negative result may occur with  improper specimen collection/handling, submission of specimen other than nasopharyngeal swab, presence of viral mutation(s) within the areas targeted by this assay, and inadequate number of viral copies(<138 copies/mL). A negative result must be combined with clinical observations,  patient history, and epidemiological information. The expected result is Negative.  Fact Sheet for Patients:  BloggerCourse.com  Fact Sheet for Healthcare Providers:  SeriousBroker.it  This test is no                          t yet approved or cleared by the [place] FDA and  has been authorized for detection and/or diagnosis of SARS-CoV-2 by FDA under an Emergency Use Authorization (EUA). This EUA will remain  in effect (meaning this test can be used) for the duration of the COVID-19 declaration under Section 564(b)(1) of the Act, 21 U.S.C.section 360bbb-3(b)(1), unless the authorization is terminated  or revoked  sooner.       Influenza A by PCR 10/07/2022 NEGATIVE  NEGATIVE Final   Influenza B by PCR 10/07/2022 NEGATIVE  NEGATIVE Final   Comment: (NOTE) The Xpert Xpress SARS-CoV-2/FLU/RSV plus assay is intended as an aid in the diagnosis of influenza from Nasopharyngeal swab specimens and should not be used as a sole basis for treatment. Nasal washings and aspirates are unacceptable for Xpert Xpress SARS-CoV-2/FLU/RSV testing.  Fact Sheet for Patients: BloggerCourse.com  Fact Sheet for Healthcare Providers: SeriousBroker.it  This test is not yet approved or cleared by the [place] FDA and has been authorized for detection and/or diagnosis of SARS-CoV-2 by FDA under an Emergency Use Authorization (EUA). This EUA will remain in effect (meaning this test can be used) for the duration of the COVID-19 declaration under Section 564(b)(1) of the Act, 21 U.S.C. section 360bbb-3(b)(1), unless the authorization is terminated or revoked.     Resp Syncytial Virus by PCR 10/07/2022 NEGATIVE  NEGATIVE Final   Comment: (NOTE) Fact Sheet for Patients: BloggerCourse.com  Fact Sheet for Healthcare  Providers: SeriousBroker.it  This test is not yet approved or cleared by the [place] FDA and has been authorized for detection and/or diagnosis of SARS-CoV-2 by FDA under an Emergency Use Authorization (EUA). This EUA will remain in effect (meaning this test can be used) for the duration of the COVID-19 declaration under Section 564(b)(1) of the Act, 21 U.S.C. section 360bbb-3(b)(1), unless the authorization is terminated or revoked.  Performed at [facility], [address] [place], [place] [address]    Specimen Description 10/07/2022 URINE, CLEAN CATCH   Final   Special Requests 10/07/2022    Final                   Value:NONE Performed at [facility], [address], [place], [place] [address]    Culture 10/07/2022 MULTIPLE SPECIES PRESENT, SUGGEST RECOLLECTION (A)   Final   Report Status 10/07/2022 10/08/2022 FINAL   Final  Admission on 07/25/2022, Discharged on 10/07/2022  Component Date Value Ref Range Status   SARS Coronavirus 2 by RT PCR 07/25/2022 NEGATIVE  NEGATIVE Final   Comment: (NOTE) SARS-CoV-2 target nucleic acids are NOT DETECTED.  The SARS-CoV-2 RNA is generally detectable in upper respiratory specimens during the acute phase of infection. The lowest concentration of SARS-CoV-2 viral copies this assay can detect is 138 copies/mL. A negative result does not preclude SARS-Cov-2 infection and should not be used as the sole basis for treatment or other patient management decisions. A negative result may occur with  improper specimen collection/handling, submission of specimen other than nasopharyngeal swab, presence of viral mutation(s) within the areas targeted by this assay, and inadequate number of viral copies(<138 copies/mL). A negative result must be combined with clinical observations, patient history, and epidemiological information. The expected result is Negative.  Fact Sheet for Patients:   BloggerCourse.com  Fact Sheet for Healthcare Providers:  SeriousBroker.it  This test is no                          t yet approved or cleared by the [place] FDA and  has been authorized for detection and/or diagnosis of SARS-CoV-2 by FDA under an Emergency Use Authorization (EUA). This EUA will remain  in effect (meaning this test can be used) for the duration of the COVID-19 declaration under Section 564(b)(1) of the Act, 21 U.S.C.section 360bbb-3(b)(1), unless the authorization is terminated  or revoked sooner.       Influenza A by PCR 07/25/2022 NEGATIVE  NEGATIVE Final   Influenza B by PCR 07/25/2022 NEGATIVE  NEGATIVE Final   Comment: (NOTE) The Xpert Xpress SARS-CoV-2/FLU/RSV plus assay is intended as an aid in the diagnosis of influenza from Nasopharyngeal swab specimens and should not be used as a sole basis for treatment. Nasal washings and aspirates are unacceptable for Xpert Xpress SARS-CoV-2/FLU/RSV testing.  Fact Sheet for Patients: BloggerCourse.com  Fact Sheet for Healthcare Providers: SeriousBroker.it  This test is not yet approved or cleared by the [place] FDA and has been authorized for detection and/or diagnosis of SARS-CoV-2 by FDA under an Emergency Use Authorization (EUA). This EUA will remain in effect (meaning this test can be used) for the duration of the COVID-19 declaration under Section 564(b)(1) of the Act, 21 U.S.C. section 360bbb-3(b)(1), unless the authorization is terminated or revoked.  Performed at [facility], [address], [place], [place] [address]    WBC 07/25/2022 8.3  4.0 - 10.5 K/uL Final   RBC 07/25/2022 4.44  3.87 - 5.11 MIL/uL Final   Hemoglobin 07/25/2022 13.7  12.0 - 15.0 g/dL Final   HCT 75/64/3329 40.2  36.0 - 46.0 % Final   MCV 07/25/2022 90.5  80.0 - 100.0 fL Final   MCH 07/25/2022 30.9  26.0 - 34.0  pg Final   MCHC 07/25/2022 34.1  30.0 - 36.0 g/dL Final   RDW 51/88/4166 12.2  11.5 - 15.5 % Final   Platelets 07/25/2022 248  150 - 400 K/uL Final   nRBC 07/25/2022 0.0  0.0 - 0.2 % Final   Neutrophils Relative % 07/25/2022 43  % Final   Neutro Abs 07/25/2022 3.6  1.7 - 7.7 K/uL Final   Lymphocytes Relative 07/25/2022 52  % Final   Lymphs Abs 07/25/2022 4.2 (H)  0.7 - 4.0 K/uL Final   Monocytes Relative 07/25/2022 5  % Final   Monocytes Absolute 07/25/2022 0.4  0.1 - 1.0 K/uL Final   Eosinophils Relative 07/25/2022 0  % Final   Eosinophils Absolute 07/25/2022 0.0  0.0 - 0.5 K/uL Final   Basophils Relative 07/25/2022 0  % Final   Basophils Absolute 07/25/2022 0.0  0.0 - 0.1 K/uL Final   Immature Granulocytes 07/25/2022 0  % Final   Abs Immature Granulocytes 07/25/2022 0.02  0.00 - 0.07 K/uL Final   Performed at [facility], [address], [place], [place] [address]   Sodium 07/25/2022 138  135 - 145 mmol/L Final   Potassium 07/25/2022 4.0  3.5 - 5.1 mmol/L Final   Chloride 07/25/2022 104  98 - 111 mmol/L Final   CO2 07/25/2022 29  22 - 32 mmol/L Final   Glucose, Bld 07/25/2022 83  70 - 99 mg/dL Final   Glucose reference range applies only to samples taken after fasting for at least 8 hours.   BUN 07/25/2022 11  6 - 20 mg/dL Final   Creatinine, Ser 07/25/2022 0.97  0.44 - 1.00 mg/dL Final   Calcium 60/07/9322 9.2  8.9 - 10.3 mg/dL Final   Total Protein 55/73/2202 7.0  6.5 - 8.1 g/dL Final   Albumin 54/27/0623 3.8  3.5 - 5.0 g/dL Final   AST 76/28/3151 18  15 - 41 U/L Final   ALT 07/25/2022 22  0 - 44 U/L Final   Alkaline Phosphatase 07/25/2022 64  38 - 126 U/L Final   Total Bilirubin 07/25/2022 0.2 (L)  0.3 -  1.2 mg/dL Final   GFR, Estimated 07/25/2022 >60  >60 mL/min Final   Comment: (NOTE) Calculated using the CKD-EPI Creatinine Equation (2021)    Anion gap 07/25/2022 5  5 - 15 Final   Performed at [facility], [address] [place], [place] [address]   POC  Amphetamine UR 07/25/2022 None Detected  NONE DETECTED (Cut Off Level 1000 ng/mL) Preliminary   POC Secobarbital (BAR) 07/25/2022 None Detected  NONE DETECTED (Cut Off Level 300 ng/mL) Preliminary   POC Buprenorphine (BUP) 07/25/2022 None Detected  NONE DETECTED (Cut Off Level 10 ng/mL) Preliminary   POC Oxazepam (BZO) 07/25/2022 None Detected  NONE DETECTED (Cut Off Level 300 ng/mL) Preliminary   POC Cocaine UR 07/25/2022 None Detected  NONE DETECTED (Cut Off Level 300 ng/mL) Preliminary   POC Methamphetamine UR 07/25/2022 None Detected  NONE DETECTED (Cut Off Level 1000 ng/mL) Preliminary   POC Morphine 07/25/2022 None Detected  NONE DETECTED (Cut Off Level 300 ng/mL) Preliminary   POC Methadone UR 07/25/2022 None Detected  NONE DETECTED (Cut Off Level 300 ng/mL) Preliminary   POC Oxycodone UR 07/25/2022 None Detected  NONE DETECTED (Cut Off Level 100 ng/mL) Preliminary   POC Marijuana UR 07/25/2022 None Detected  NONE DETECTED (Cut Off Level 50 ng/mL) Preliminary   SARSCOV2ONAVIRUS 2 AG 07/25/2022 NEGATIVE  NEGATIVE Final   Comment: (NOTE) SARS-CoV-2 antigen NOT DETECTED.   Negative results are presumptive.  Negative results do not preclude SARS-CoV-2 infection and should not be used as the sole basis for treatment or other patient management decisions, including infection  control decisions, particularly in the presence of clinical signs and  symptoms consistent with COVID-19, or in those who have been in contact with the virus.  Negative results must be combined with clinical observations, patient history, and epidemiological information. The expected result is Negative.  Fact Sheet for Patients: https://www.jennings-kim.com/  Fact Sheet for Healthcare Providers: https://alexander-rogers.biz/  This test is not yet approved or cleared by the [place] FDA and  has been authorized for detection and/or diagnosis of SARS-CoV-2 by FDA under an Emergency Use  Authorization (EUA).  This EUA will remain in effect (meaning this test can be used) for the duration of  the COV                          ID-19 declaration under Section 564(b)(1) of the Act, 21 U.S.C. section 360bbb-3(b)(1), unless the authorization is terminated or revoked sooner.     Cholesterol 07/25/2022 181  0 - 200 mg/dL Final   Triglycerides 69/62/9528 118  <150 mg/dL Final   HDL 41/32/4401 45  >40 mg/dL Final   Total CHOL/HDL Ratio 07/25/2022 4.0  RATIO Final   VLDL 07/25/2022 24  0 - 40 mg/dL Final   LDL Cholesterol 07/25/2022 112 (H)  0 - 99 mg/dL Final   Comment:        Total Cholesterol/HDL:CHD Risk Coronary Heart Disease Risk Table                     Men   Women  1/2 Average Risk   3.4   3.3  Average Risk       5.0   4.4  2 X Average Risk   9.6   7.1  3 X Average Risk  23.4   11.0        Use the calculated Patient Ratio above and the CHD Risk Table to determine  the patient's CHD Risk.        ATP III CLASSIFICATION (LDL):  <100     mg/dL   Optimal  696-295  mg/dL   Near or Above                    Optimal  130-159  mg/dL   Borderline  284-132  mg/dL   High  >440     mg/dL   Very High Performed at [facility], [address], [place], [place] [address]    TSH 07/25/2022 6.668 (H)  0.350 - 4.500 uIU/mL Final   Comment: Performed by a 3rd Generation assay with a functional sensitivity of <=0.01 uIU/mL. Performed at [facility], [address] [place], [place] [address]    Glucose-Capillary 07/26/2022 104 (H)  70 - 99 mg/dL Final   Glucose reference range applies only to samples taken after fasting for at least 8 hours.   T3, Free 07/31/2022 2.3  2.0 - 4.4 pg/mL Final   Comment: (NOTE) Performed At: [facility] [address] [place], [place] [id number] Jolene Schimke MD DG:[id number]    Free T4 07/31/2022 0.60 (L)  0.61 - 1.12 ng/dL Final   Comment: (NOTE) Biotin ingestion may interfere with free T4 tests. If the results  are inconsistent with the TSH level, previous test results, or the clinical presentation, then consider biotin interference. If needed, order repeat testing after stopping biotin. Performed at [facility], [address], [place], [place] [address]    Glucose-Capillary 08/29/2022 100 (H)  70 - 99 mg/dL Final   Glucose reference range applies only to samples taken after fasting for at least 8 hours.   TSH 09/05/2022 0.793  0.350 - 4.500 uIU/mL Final   Comment: Performed by a 3rd Generation assay with a functional sensitivity of <=0.01 uIU/mL. Performed at [facility], [address] [place], [place] [address]    Free T4 09/05/2022 0.73  0.61 - 1.12 ng/dL Final   Comment: (NOTE) Biotin ingestion may interfere with free T4 tests. If the results are inconsistent with the TSH level, previous test results, or the clinical presentation, then consider biotin interference. If needed, order repeat testing after stopping biotin. Performed at [facility], [address] [address], [place], [place] [address]    Preg Test, Ur 09/06/2022 Negative  Negative Final   Preg Test, Ur 09/06/2022 NEGATIVE  NEGATIVE Final   Comment:        THE SENSITIVITY OF THIS METHODOLOGY IS >24 mIU/mL    Preg Test, Ur 09/05/2022 NEGATIVE  NEGATIVE Final   Comment:        THE SENSITIVITY OF THIS METHODOLOGY IS >24 mIU/mL    Sodium 09/13/2022 136  135 - 145 mmol/L Final   Potassium 09/13/2022 4.5  3.5 - 5.1 mmol/L Final   Chloride 09/13/2022 105  98 - 111 mmol/L Final   CO2 09/13/2022 21 (L)  22 - 32 mmol/L Final   Glucose, Bld 09/13/2022 111 (H)  70 - 99 mg/dL Final   Glucose reference range applies only to samples taken after fasting for at least 8 hours.   BUN 09/13/2022 14  6 - 20 mg/dL Final   Creatinine, Ser 09/13/2022 0.92  0.44 - 1.00 mg/dL Final   Calcium 10/93/2355 9.1  8.9 - 10.3 mg/dL Final   GFR, Estimated 09/13/2022 >60  >60 mL/min Final   Comment: (NOTE) Calculated using the CKD-EPI  Creatinine Equation (2021)  Anion gap 09/13/2022 10  5 - 15 Final   Performed at [facility], [address], [place], [place] [address]   Vitamin B-12 09/13/2022 585  180 - 914 pg/mL Final   Comment: (NOTE) This assay is not validated for testing neonatal or myeloproliferative syndrome specimens for Vitamin B12 levels. Performed at [facility], [address], [place], [place] [address]    Color, Urine 09/14/2022 YELLOW  YELLOW Final   APPearance 09/14/2022 HAZY (A)  CLEAR Final   Specific Gravity, Urine 09/14/2022 1.025  1.005 - 1.030 Final   pH 09/14/2022 5.0  5.0 - 8.0 Final   Glucose, UA 09/14/2022 NEGATIVE  NEGATIVE mg/dL Final   Hgb urine dipstick 09/14/2022 NEGATIVE  NEGATIVE Final   Bilirubin Urine 09/14/2022 NEGATIVE  NEGATIVE Final   Ketones, ur 09/14/2022 NEGATIVE  NEGATIVE mg/dL Final   Protein, ur 78/67/6720 NEGATIVE  NEGATIVE mg/dL Final   Nitrite 94/70/9628 NEGATIVE  NEGATIVE Final   Leukocytes,Ua 09/14/2022 TRACE (A)  NEGATIVE Final   RBC / HPF 09/14/2022 0-5  0 - 5 RBC/hpf Final   WBC, UA 09/14/2022 0-5  0 - 5 WBC/hpf Final   Bacteria, UA 09/14/2022 RARE (A)  NONE SEEN Final   Squamous Epithelial / HPF 09/14/2022 0-5  0 - 5 Final   Mucus 09/14/2022 PRESENT   Final   Performed at [facility], [address] [place], [place] [address]   Glucose-Capillary 09/16/2022 105 (H)  70 - 99 mg/dL Final   Glucose reference range applies only to samples taken after fasting for at least 8 hours.   SARS Coronavirus 2 by RT PCR 09/30/2022 NEGATIVE  NEGATIVE Final   Comment: (NOTE) SARS-CoV-2 target nucleic acids are NOT DETECTED.  The SARS-CoV-2 RNA is generally detectable in upper respiratory specimens during the acute phase of infection. The lowest concentration of SARS-CoV-2 viral copies this assay can detect is 138 copies/mL. A negative result does not preclude SARS-Cov-2 infection and should not be used as the sole basis for treatment or other  patient management decisions. A negative result may occur with  improper specimen collection/handling, submission of specimen other than nasopharyngeal swab, presence of viral mutation(s) within the areas targeted by this assay, and inadequate number of viral copies(<138 copies/mL). A negative result must be combined with clinical observations, patient history, and epidemiological information. The expected result is Negative.  Fact Sheet for Patients:  BloggerCourse.com  Fact Sheet for Healthcare Providers:  SeriousBroker.it  This test is no                          t yet approved or cleared by the [place] FDA and  has been authorized for detection and/or diagnosis of SARS-CoV-2 by FDA under an Emergency Use Authorization (EUA). This EUA will remain  in effect (meaning this test can be used) for the duration of the COVID-19 declaration under Section 564(b)(1) of the Act, 21 U.S.C.section 360bbb-3(b)(1), unless the authorization is terminated  or revoked sooner.       Influenza A by PCR 09/30/2022 NEGATIVE  NEGATIVE Final   Influenza B by PCR 09/30/2022 NEGATIVE  NEGATIVE Final   Comment: (NOTE) The Xpert Xpress SARS-CoV-2/FLU/RSV plus assay is intended as an aid in the diagnosis of influenza from Nasopharyngeal swab specimens and should not be used as a sole basis for treatment. Nasal washings and aspirates are unacceptable for Xpert Xpress SARS-CoV-2/FLU/RSV testing.  Fact Sheet for Patients:  BloggerCourse.comhttps://www.fda.gov/media/152166/download  Fact Sheet for Healthcare Providers: SeriousBroker.ithttps://www.fda.gov/media/152162/download  This test is not yet approved or cleared by the [place] FDA and has been authorized for detection and/or diagnosis of SARS-CoV-2 by FDA under an Emergency Use Authorization (EUA). This EUA will remain in effect (meaning this test can be used) for the duration of the COVID-19 declaration under Section  564(b)(1) of the Act, 21 U.S.C. section 360bbb-3(b)(1), unless the authorization is terminated or revoked.     Resp Syncytial Virus by PCR 09/30/2022 NEGATIVE  NEGATIVE Final   Comment: (NOTE) Fact Sheet for Patients: BloggerCourse.comhttps://www.fda.gov/media/152166/download  Fact Sheet for Healthcare Providers: SeriousBroker.ithttps://www.fda.gov/media/152162/download  This test is not yet approved or cleared by the [place] FDA and has been authorized for detection and/or diagnosis of SARS-CoV-2 by FDA under an Emergency Use Authorization (EUA). This EUA will remain in effect (meaning this test can be used) for the duration of the COVID-19 declaration under Section 564(b)(1) of the Act, 21 U.S.C. section 360bbb-3(b)(1), unless the authorization is terminated or revoked.  Performed at [facility], [address] [place], [place] [address]    Sodium 10/01/2022 136  135 - 145 mmol/L Final   Potassium 10/01/2022 3.8  3.5 - 5.1 mmol/L Final   Chloride 10/01/2022 104  98 - 111 mmol/L Final   CO2 10/01/2022 27  22 - 32 mmol/L Final   Glucose, Bld 10/01/2022 98  70 - 99 mg/dL Final   Glucose reference range applies only to samples taken after fasting for at least 8 hours.   BUN 10/01/2022 12  6 - 20 mg/dL Final   Creatinine, Ser 10/01/2022 0.98  0.44 - 1.00 mg/dL Final   Calcium 60/45/409812/19/2023 8.9  8.9 - 10.3 mg/dL Final   GFR, Estimated 10/01/2022 >60  >60 mL/min Final   Comment: (NOTE) Calculated using the CKD-EPI Creatinine Equation (2021)    Anion gap 10/01/2022 5  5 - 15 Final   Performed at [facility], [address] [place], [place] [address]   WBC 10/01/2022 5.1  4.0 - 10.5 K/uL Final   RBC 10/01/2022 4.31  3.87 - 5.11 MIL/uL Final   Hemoglobin 10/01/2022 12.7  12.0 - 15.0 g/dL Final   HCT 78/29/562112/19/2023 38.8  36.0 - 46.0 % Final   MCV 10/01/2022 90.0  80.0 - 100.0 fL Final   MCH 10/01/2022 29.5  26.0 - 34.0 pg Final   MCHC 10/01/2022 32.7  30.0 - 36.0 g/dL Final   RDW 30/86/578412/19/2023 12.4   11.5 - 15.5 % Final   Platelets 10/01/2022 300  150 - 400 K/uL Final   nRBC 10/01/2022 0.0  0.0 - 0.2 % Final   Performed at [facility], [address] [place], [place] [address]   Lipase 10/01/2022 29  11 - 51 U/L Final   Performed at [facility], [address] [place], [place] [address]   Color, Urine 10/05/2022 YELLOW  YELLOW Final   APPearance 10/05/2022 HAZY (A)  CLEAR Final   Specific Gravity, Urine 10/05/2022 1.018  1.005 - 1.030 Final   pH 10/05/2022 5.0  5.0 - 8.0 Final   Glucose, UA 10/05/2022 NEGATIVE  NEGATIVE mg/dL Final   Hgb urine dipstick 10/05/2022 NEGATIVE  NEGATIVE Final   Bilirubin Urine 10/05/2022 NEGATIVE  NEGATIVE Final   Ketones, ur 10/05/2022 NEGATIVE  NEGATIVE mg/dL Final   Protein, ur 32/44/010212/23/2023 NEGATIVE  NEGATIVE mg/dL Final   Nitrite 72/53/664412/23/2023 NEGATIVE  NEGATIVE Final   Leukocytes,Ua 10/05/2022 TRACE (A)  NEGATIVE Final   RBC / HPF 10/05/2022 0-5  0 - 5 RBC/hpf Final   WBC, UA 10/05/2022 0-5  0 - 5 WBC/hpf Final   Bacteria, UA 10/05/2022 RARE (A)  NONE SEEN Final   Squamous Epithelial / HPF 10/05/2022 0-5  0 - 5 Final   Mucus 10/05/2022 PRESENT   Final   Performed at [facility], [address], [place], [place] [address]   Specimen Description 10/05/2022 URINE, CLEAN CATCH   Final   Special Requests 10/05/2022    Final                   Value:NONE Performed at [facility], [address], [place], [place] [address]    Culture 10/05/2022 10,000 COLONIES/mL MULTIPLE SPECIES PRESENT, SUGGEST RECOLLECTION (A)   Final   Report Status 10/05/2022 10/07/2022 FINAL   Final  Admission on 07/04/2022, Discharged on 07/05/2022  Component Date Value Ref Range Status   Sodium 07/04/2022 142  135 - 145 mmol/L Final   Potassium 07/04/2022 4.4  3.5 - 5.1 mmol/L Final   Chloride 07/04/2022 109  98 - 111 mmol/L Final   CO2 07/04/2022 26  22 - 32 mmol/L Final   Glucose, Bld 07/04/2022 96  70 - 99 mg/dL Final   Glucose reference range  applies only to samples taken after fasting for at least 8 hours.   BUN 07/04/2022 15  6 - 20 mg/dL Final   Creatinine, Ser 07/04/2022 0.83  0.44 - 1.00 mg/dL Final   Calcium 82/95/6213 9.3  8.9 - 10.3 mg/dL Final   Total Protein 08/65/7846 7.2  6.5 - 8.1 g/dL Final   Albumin 96/29/5284 3.9  3.5 - 5.0 g/dL Final   AST 13/24/4010 23  15 - 41 U/L Final   ALT 07/04/2022 28  0 - 44 U/L Final   Alkaline Phosphatase 07/04/2022 77  38 - 126 U/L Final   Total Bilirubin 07/04/2022 0.4  0.3 - 1.2 mg/dL Final   GFR, Estimated 07/04/2022 >60  >60 mL/min Final   Comment: (NOTE) Calculated using the CKD-EPI Creatinine Equation (2021)    Anion gap 07/04/2022 7  5 - 15 Final   Performed at [facility], [address], [place], [place] [address]   Alcohol, Ethyl (B) 07/04/2022 <10  <10 mg/dL Final   Comment: (NOTE) Lowest detectable limit for serum alcohol is 10 mg/dL.  For medical purposes only. Performed at [facility], [address], [place], [place] [address]    WBC 07/04/2022 7.0  4.0 - 10.5 K/uL Final   RBC 07/04/2022 4.18  3.87 - 5.11 MIL/uL Final   Hemoglobin 07/04/2022 12.8  12.0 - 15.0 g/dL Final   HCT 34/74/2595 39.1  36.0 - 46.0 % Final   MCV 07/04/2022 93.5  80.0 - 100.0 fL Final   MCH 07/04/2022 30.6  26.0 - 34.0 pg Final   MCHC 07/04/2022 32.7  30.0 - 36.0 g/dL Final   RDW 63/87/5643 12.9  11.5 - 15.5 % Final   Platelets 07/04/2022 243  150 - 400 K/uL Final   nRBC 07/04/2022 0.0  0.0 - 0.2 % Final   Neutrophils Relative % 07/04/2022 43  % Final   Neutro Abs 07/04/2022 3.0  1.7 - 7.7 K/uL Final   Lymphocytes Relative 07/04/2022 50  % Final   Lymphs Abs 07/04/2022 3.5  0.7 - 4.0 K/uL Final   Monocytes Relative 07/04/2022 7  % Final   Monocytes Absolute  07/04/2022 0.5  0.1 - 1.0 K/uL Final   Eosinophils Relative 07/04/2022 0  % Final   Eosinophils Absolute 07/04/2022 0.0  0.0 - 0.5 K/uL Final   Basophils Relative 07/04/2022 0  % Final    Basophils Absolute 07/04/2022 0.0  0.0 - 0.1 K/uL Final   Immature Granulocytes 07/04/2022 0  % Final   Abs Immature Granulocytes 07/04/2022 0.01  0.00 - 0.07 K/uL Final   Performed at [facility], [address] [place], [place] [address]   I-stat hCG, quantitative 07/04/2022 <5.0  <5 mIU/mL Final   Comment 3 07/04/2022          Final   Comment:   GEST. AGE      CONC.  (mIU/mL)   <=1 WEEK        5 - 50     2 WEEKS       50 - 500     3 WEEKS       100 - 10,000     4 WEEKS     1,000 - 30,000        FEMALE AND NON-PREGNANT FEMALE:     LESS THAN 5 mIU/mL   Admission on 06/14/2022, Discharged on 06/14/2022  Component Date Value Ref Range Status   Color, UA 06/14/2022 yellow  yellow Final   Clarity, UA 06/14/2022 cloudy (A)  clear Final   Glucose, UA 06/14/2022 negative  negative mg/dL Final   Bilirubin, UA 96/29/528409/10/2021 negative  negative Final   Ketones, POC UA 06/14/2022 negative  negative mg/dL Final   Spec Grav, UA 13/24/401009/10/2021 1.025  1.010 - 1.025 Final   Blood, UA 06/14/2022 negative  negative Final   pH, UA 06/14/2022 7.5  5.0 - 8.0 Final   Protein Ur, POC 06/14/2022 =30 (A)  negative mg/dL Final   Urobilinogen, UA 06/14/2022 0.2  0.2 or 1.0 E.U./dL Final   Nitrite, UA 27/25/366409/10/2021 Negative  Negative Final   Leukocytes, UA 06/14/2022 Negative  Negative Final   Preg Test, Ur 06/14/2022 Negative  Negative Final   Specimen Description 06/14/2022 URINE, CLEAN CATCH   Final   Special Requests 06/14/2022    Final                   Value:NONE Performed at [facility], [address] [place], [place] [address]    Culture 06/14/2022 MULTIPLE SPECIES PRESENT, SUGGEST RECOLLECTION (A)   Final   Report Status 06/14/2022 06/16/2022 FINAL   Final  Admission on 06/07/2022, Discharged on 06/10/2022  Component Date Value Ref Range Status   SARS Coronavirus 2 by RT PCR 06/07/2022 NEGATIVE  NEGATIVE Final   Comment: (NOTE) SARS-CoV-2 target nucleic acids are NOT  DETECTED.  The SARS-CoV-2 RNA is generally detectable in upper respiratory specimens during the acute phase of infection. The lowest concentration of SARS-CoV-2 viral copies this assay can detect is 138 copies/mL. A negative result does not preclude SARS-Cov-2 infection and should not be used as the sole basis for treatment or other patient management decisions. A negative result may occur with  improper specimen collection/handling, submission of specimen other than nasopharyngeal swab, presence of viral mutation(s) within the areas targeted by this assay, and inadequate number of viral copies(<138 copies/mL). A negative result must be combined with clinical observations, patient history, and epidemiological information. The expected result is Negative.  Fact Sheet for Patients:  BloggerCourse.comhttps://www.fda.gov/media/152166/download  Fact Sheet for Healthcare Providers:  SeriousBroker.ithttps://www.fda.gov/media/152162/download  This test is no  t yet approved or cleared by the [place] and  has been authorized for detection and/or diagnosis of SARS-CoV-2 by FDA under an Emergency Use Authorization (EUA). This EUA will remain  in effect (meaning this test can be used) for the duration of the COVID-19 declaration under Section 564(b)(1) of the Act, 21 U.S.C.section 360bbb-3(b)(1), unless the authorization is terminated  or revoked sooner.       Influenza A by PCR 06/07/2022 NEGATIVE  NEGATIVE Final   Influenza B by PCR 06/07/2022 NEGATIVE  NEGATIVE Final   Comment: (NOTE) The Xpert Xpress SARS-CoV-2/FLU/RSV plus assay is intended as an aid in the diagnosis of influenza from Nasopharyngeal swab specimens and should not be used as a sole basis for treatment. Nasal washings and aspirates are unacceptable for Xpert Xpress SARS-CoV-2/FLU/RSV testing.  Fact Sheet for Patients: BloggerCourse.com  Fact Sheet for Healthcare  Providers: SeriousBroker.it  This test is not yet approved or cleared by the [place] FDA and has been authorized for detection and/or diagnosis of SARS-CoV-2 by FDA under an Emergency Use Authorization (EUA). This EUA will remain in effect (meaning this test can be used) for the duration of the COVID-19 declaration under Section 564(b)(1) of the Act, 21 U.S.C. section 360bbb-3(b)(1), unless the authorization is terminated or revoked.  Performed at [facility], [address] [place], [place] [address]    WBC 06/07/2022 5.7  4.0 - 10.5 K/uL Final   RBC 06/07/2022 4.39  3.87 - 5.11 MIL/uL Final   Hemoglobin 06/07/2022 13.2  12.0 - 15.0 g/dL Final   HCT 75/07/2584 40.4  36.0 - 46.0 % Final   MCV 06/07/2022 92.0  80.0 - 100.0 fL Final   MCH 06/07/2022 30.1  26.0 - 34.0 pg Final   MCHC 06/07/2022 32.7  30.0 - 36.0 g/dL Final   RDW 27/78/2423 12.4  11.5 - 15.5 % Final   Platelets 06/07/2022 308  150 - 400 K/uL Final   nRBC 06/07/2022 0.0  0.0 - 0.2 % Final   Neutrophils Relative % 06/07/2022 42  % Final   Neutro Abs 06/07/2022 2.4  1.7 - 7.7 K/uL Final   Lymphocytes Relative 06/07/2022 54  % Final   Lymphs Abs 06/07/2022 3.1  0.7 - 4.0 K/uL Final   Monocytes Relative 06/07/2022 4  % Final   Monocytes Absolute 06/07/2022 0.2  0.1 - 1.0 K/uL Final   Eosinophils Relative 06/07/2022 0  % Final   Eosinophils Absolute 06/07/2022 0.0  0.0 - 0.5 K/uL Final   Basophils Relative 06/07/2022 0  % Final   Basophils Absolute 06/07/2022 0.0  0.0 - 0.1 K/uL Final   Immature Granulocytes 06/07/2022 0  % Final   Abs Immature Granulocytes 06/07/2022 0.01  0.00 - 0.07 K/uL Final   Performed at [facility], [address] [place], [place] [address]   Sodium 06/07/2022 139  135 - 145 mmol/L Final   Potassium 06/07/2022 4.0  3.5 - 5.1 mmol/L Final   Chloride 06/07/2022 104  98 - 111 mmol/L Final   CO2 06/07/2022 28  22 - 32 mmol/L Final   Glucose, Bld  06/07/2022 104 (H)  70 - 99 mg/dL Final   Glucose reference range applies only to samples taken after fasting for at least 8 hours.   BUN 06/07/2022 8  6 - 20 mg/dL Final   Creatinine, Ser 06/07/2022 0.84  0.44 - 1.00 mg/dL Final   Calcium 43/15/4008 9.1  8.9 - 10.3 mg/dL Final  Total Protein 06/07/2022 6.9  6.5 - 8.1 g/dL Final   Albumin 16/07/9603 3.7  3.5 - 5.0 g/dL Final   AST 54/06/8118 19  15 - 41 U/L Final   ALT 06/07/2022 22  0 - 44 U/L Final   Alkaline Phosphatase 06/07/2022 54  38 - 126 U/L Final   Total Bilirubin 06/07/2022 0.4  0.3 - 1.2 mg/dL Final   GFR, Estimated 06/07/2022 >60  >60 mL/min Final   Comment: (NOTE) Calculated using the CKD-EPI Creatinine Equation (2021)    Anion gap 06/07/2022 7  5 - 15 Final   Performed at [facility], [address], [place], [place] [address]   Hgb A1c MFr Bld 06/07/2022 5.0  4.8 - 5.6 % Final   Comment: (NOTE) Pre diabetes:          5.7%-6.4%  Diabetes:              >6.4%  Glycemic control for   <7.0% adults with diabetes    Mean Plasma Glucose 06/07/2022 96.8  mg/dL Final   Performed at [facility], [address], [place], [place] [address]   TSH 06/07/2022 1.620  0.350 - 4.500 uIU/mL Final   Comment: Performed by a 3rd Generation assay with a functional sensitivity of <=0.01 uIU/mL. Performed at [facility], [address] [place], [place] [address]    RPR Ser Ql 06/07/2022 NON REACTIVE  NON REACTIVE Final   Performed at [facility], [address], [place], [place] [address]   Color, Urine 06/07/2022 YELLOW  YELLOW Final   APPearance 06/07/2022 HAZY (A)  CLEAR Final   Specific Gravity, Urine 06/07/2022 1.018  1.005 - 1.030 Final   pH 06/07/2022 7.0  5.0 - 8.0 Final   Glucose, UA 06/07/2022 NEGATIVE  NEGATIVE mg/dL Final   Hgb urine dipstick 06/07/2022 NEGATIVE  NEGATIVE Final   Bilirubin Urine 06/07/2022 NEGATIVE  NEGATIVE Final   Ketones, ur 06/07/2022 NEGATIVE  NEGATIVE mg/dL Final    Protein, ur 62/95/2841 NEGATIVE  NEGATIVE mg/dL Final   Nitrite 32/44/0102 NEGATIVE  NEGATIVE Final   Leukocytes,Ua 06/07/2022 NEGATIVE  NEGATIVE Final   Performed at [facility], [address] [place], [place] [address]   Cholesterol 06/07/2022 164  0 - 200 mg/dL Final   Triglycerides 64/40/3474 158 (H)  <150 mg/dL Final   HDL 25/95/6387 44  >40 mg/dL Final   Total CHOL/HDL Ratio 06/07/2022 3.7  RATIO Final   VLDL 06/07/2022 32  0 - 40 mg/dL Final   LDL Cholesterol 06/07/2022 88  0 - 99 mg/dL Final   Comment:        Total Cholesterol/HDL:CHD Risk Coronary Heart Disease Risk Table                     Men   Women  1/2 Average Risk   3.4   3.3  Average Risk       5.0   4.4  2 X Average Risk   9.6   7.1  3 X Average Risk  23.4   11.0        Use the calculated Patient Ratio above and the CHD Risk Table to determine the patient's CHD Risk.        ATP III CLASSIFICATION (LDL):  <100     mg/dL   Optimal  564-332  mg/dL   Near or Above  Optimal  130-159  mg/dL   Borderline  213-086  mg/dL   High  >578     mg/dL   Very High Performed at [facility], [address] [place], [place] [address]    HIV Screen 4th Generation wRfx 06/07/2022 Non Reactive  Non Reactive Final   Performed at [facility], [address] [place], [place] [address]   SARSCOV2ONAVIRUS 2 AG 06/07/2022 NEGATIVE  NEGATIVE Final   Comment: (NOTE) SARS-CoV-2 antigen NOT DETECTED.   Negative results are presumptive.  Negative results do not preclude SARS-CoV-2 infection and should not be used as the sole basis for treatment or other patient management decisions, including infection  control decisions, particularly in the presence of clinical signs and  symptoms consistent with COVID-19, or in those who have been in contact with the virus.  Negative results must be combined with clinical observations, patient history, and epidemiological information. The expected result is  Negative.  Fact Sheet for Patients: https://www.jennings-kim.com/  Fact Sheet for Healthcare Providers: https://alexander-rogers.biz/  This test is not yet approved or cleared by the [place] FDA and  has been authorized for detection and/or diagnosis of SARS-CoV-2 by FDA under an Emergency Use Authorization (EUA).  This EUA will remain in effect (meaning this test can be used) for the duration of  the COV                          ID-19 declaration under Section 564(b)(1) of the Act, 21 U.S.C. section 360bbb-3(b)(1), unless the authorization is terminated or revoked sooner.     POC Amphetamine UR 06/07/2022 None Detected  NONE DETECTED (Cut Off Level 1000 ng/mL) Final   POC Secobarbital (BAR) 06/07/2022 None Detected  NONE DETECTED (Cut Off Level 300 ng/mL) Final   POC Buprenorphine (BUP) 06/07/2022 None Detected  NONE DETECTED (Cut Off Level 10 ng/mL) Final   POC Oxazepam (BZO) 06/07/2022 None Detected  NONE DETECTED (Cut Off Level 300 ng/mL) Final   POC Cocaine UR 06/07/2022 None Detected  NONE DETECTED (Cut Off Level 300 ng/mL) Final   POC Methamphetamine UR 06/07/2022 None Detected  NONE DETECTED (Cut Off Level 1000 ng/mL) Final   POC Morphine 06/07/2022 None Detected  NONE DETECTED (Cut Off Level 300 ng/mL) Final   POC Methadone UR 06/07/2022 None Detected  NONE DETECTED (Cut Off Level 300 ng/mL) Final   POC Oxycodone UR 06/07/2022 None Detected  NONE DETECTED (Cut Off Level 100 ng/mL) Final   POC Marijuana UR 06/07/2022 None Detected  NONE DETECTED (Cut Off Level 50 ng/mL) Final   Preg Test, Ur 06/08/2022 NEGATIVE  NEGATIVE Final   Comment:        THE SENSITIVITY OF THIS METHODOLOGY IS >20 mIU/mL. Performed at [facility], [address], [place], [place] [address]    Preg Test, Ur 06/08/2022 NEGATIVE  NEGATIVE Final   Comment:        THE SENSITIVITY OF THIS METHODOLOGY IS >24 mIU/mL     Blood Alcohol level:  Lab Results  Component  Value Date   ETH <10 07/04/2022   ETH <10 02/07/2022    Metabolic Disorder Labs: Lab Results  Component Value Date   HGBA1C 5.7 (H) 10/15/2022   MPG 117 10/15/2022   MPG 96.8 06/07/2022   No results found for: "PROLACTIN" Lab Results  Component Value Date   CHOL 181 07/25/2022   TRIG 118 07/25/2022   HDL 45 07/25/2022  CHOLHDL 4.0 07/25/2022   VLDL 24 07/25/2022   LDLCALC 112 (H) 07/25/2022   LDLCALC 88 06/07/2022    Therapeutic Lab Levels: No results found for: "LITHIUM" No results found for: "VALPROATE" No results found for: "CBMZ"  Physical Findings   GAD-7    Flowsheet Row [facility]  Total GAD-7 Score 17      PHQ2-9    Flowsheet Row [facility]  PHQ-2 Total Score 1 2  PHQ-9 Total Score 2 8      Flowsheet Row [facility] Most recent reading at 10/20/2022 11:04 AM ED from [facility]07/2022 [facility] Most recent reading at 10/07/2022 12:18 PM [facility] Most recent reading at 10/06/2022  9:38 AM  C-SSRS RISK CATEGORY No Risk No Risk No Risk        Musculoskeletal  Strength & Muscle Tone: within normal limits Gait & Station: normal Patient leans: N/A  Psychiatric Specialty Exam  Presentation  General Appearance:  Appropriate for Environment; Casual  Eye Contact: Good  Speech: Clear and Coherent; Normal Rate  Speech Volume: Normal  Handedness: Right   Mood and Affect  Mood: Euthymic  Affect: Congruent   Thought Process  Thought Processes: Coherent  Descriptions of Associations:Intact  Orientation:Full (Time, Place and Person)  Thought Content:Logical  Diagnosis of Schizophrenia or Schizoaffective disorder in past:  No    Hallucinations:denied  Ideas of Reference:None  Suicidal Thoughts:denied Homicidal Thoughts:denied   Sensorium  Memory: Immediate Good; Recent Good; Remote Good  Judgment: Good  Insight: Good   Executive Functions  Concentration: Good  Attention Span: Good  Recall: Good  Fund of Knowledge: Good  Language: Good   Psychomotor Activity  Psychomotor Activity: normal   Assets  Assets: Communication Skills; Desire for Improvement; Leisure Time; Physical Health; Resilience; Social Support   Sleep  Sleep: good  Physical Exam  Physical Exam Vitals and nursing note reviewed.  Constitutional:      General: She is not in acute distress.    Appearance: She is not ill-appearing or diaphoretic.  HENT:     Head: Normocephalic and atraumatic.  Pulmonary:     Effort: Pulmonary effort is normal. No respiratory distress.  Neurological:     General: No focal deficit present.     Mental Status: She is alert and oriented to person, place, and time.    Review of Systems  HENT:  Negative for ear pain (improving).   Respiratory:  Negative for shortness of breath.   Cardiovascular:  Negative for chest pain.  Gastrointestinal:  Negative for abdominal pain, constipation, diarrhea, nausea and vomiting.  Musculoskeletal:  Positive for back pain (due to recliner).  Neurological:  Negative for dizziness, tremors and headaches.   Blood pressure (!) 130/96, pulse 95, temperature 97.8 F (36.6 C), temperature source Oral, resp. rate 18, height 5\' 1"  (1.549 m), weight 198 lb (89.8 kg), SpO2 99 %. Body mass index is 37.41 kg/m.  Treatment Plan Summary: Plan Patient continues to be psychiatrically cleared, stable and ready for discharge.  Social work/TOC, DSS to continue seeking appropriate placement for patient.    Intertrigo Started topical ketoconazole  Started triple paste barrier cream  Princess BruinsJulie Claressa Hughley, DO 10/24/2022 1:06 PM

## 2022-10-24 NOTE — ED Notes (Signed)
Pt alert at this hour. No apparent distress. RR even and unlabored. Monitored for safety.  

## 2022-10-24 NOTE — ED Notes (Signed)
Pt asleep at this hour. No apparent distress. RR even and unlabored. Monitored for safety.  

## 2022-10-24 NOTE — Progress Notes (Signed)
Received Easter this AM asleep in her chair bed. She continued to sleep in this AM without distress.

## 2022-10-24 NOTE — ED Notes (Signed)
Pt received bedtime snack. 

## 2022-10-25 DIAGNOSIS — N9489 Other specified conditions associated with female genital organs and menstrual cycle: Secondary | ICD-10-CM | POA: Diagnosis not present

## 2022-10-25 DIAGNOSIS — F333 Major depressive disorder, recurrent, severe with psychotic symptoms: Secondary | ICD-10-CM | POA: Diagnosis not present

## 2022-10-25 DIAGNOSIS — F209 Schizophrenia, unspecified: Secondary | ICD-10-CM | POA: Diagnosis not present

## 2022-10-25 DIAGNOSIS — E039 Hypothyroidism, unspecified: Secondary | ICD-10-CM | POA: Diagnosis not present

## 2022-10-25 DIAGNOSIS — F419 Anxiety disorder, unspecified: Secondary | ICD-10-CM | POA: Diagnosis not present

## 2022-10-25 DIAGNOSIS — N390 Urinary tract infection, site not specified: Secondary | ICD-10-CM | POA: Diagnosis not present

## 2022-10-25 DIAGNOSIS — E118 Type 2 diabetes mellitus with unspecified complications: Secondary | ICD-10-CM | POA: Diagnosis not present

## 2022-10-25 DIAGNOSIS — Z1152 Encounter for screening for COVID-19: Secondary | ICD-10-CM | POA: Diagnosis not present

## 2022-10-25 DIAGNOSIS — F17200 Nicotine dependence, unspecified, uncomplicated: Secondary | ICD-10-CM | POA: Diagnosis not present

## 2022-10-25 NOTE — Discharge Instructions (Addendum)
Patient is instructed prior to discharge to: Take all medications as prescribed by his/her mental healthcare provider. Report any adverse effects and or reactions from the medicines to his/her outpatient provider promptly. Keep all scheduled appointments, to ensure that you are getting refills on time and to avoid any interruption in your medication.  If you are unable to keep an appointment call to reschedule.  Be sure to follow-up with resources and follow-up appointments provided.  Patient has been instructed & cautioned: To not engage in alcohol and or illegal drug use while on prescription medicines. In the event of worsening symptoms, patient is instructed to call the crisis hotline, 911 and or go to the nearest ED for appropriate evaluation and treatment of symptoms. To follow-up with his/her primary care provider for your other medical issues, concerns and or health care needs.  Information: -National Suicide Prevention Lifeline 1-800-SUICIDE or (856) 374-7787.  -988 offers 24/7 access to trained crisis counselors who can help people experiencing mental health-related distress. People can call or text 988 or chat 988lifeline.org for themselves or if they are worried about a loved one who may need crisis support.

## 2022-10-25 NOTE — ED Notes (Signed)
Pt asleep at this hour. No apparent distress. RR even and unlabored. Monitored for safety.

## 2022-10-25 NOTE — ED Notes (Signed)
Pt resting quietly breathing even and unlabored.  Staff will continue to monitor for safety.  

## 2022-10-25 NOTE — ED Provider Notes (Signed)
Behavioral Health Progress Note  Date and Time: 10/25/2022 9:04 AM Name: Nicole [name] MRN:  [id number]  HPI Nicole [name] 32 y.o., female patient with schizophrenia, MDD with psychosis,  presented to [facility], voluntarily, as a walk-in accompanied by [employer] after a disagreement and verbal altercation with personnel at the group home.  She has been dismissed previously from other group homes due to threatening behaviors, and suicidal ideation and the one which she was residing in.  Patient was admitted to continuous assessment unit on 07/25/2022 as she also endorsed passive SI at that time along with symptoms of visual hallucination. Patient was subsequently psychiatrically cleared during her admission to the continuous assessment unit.  However patient remains here at [facility] bordering until adequate- residential placement can be obtained.    Legal Guardian: [name]) transitioning to be [name]) Point of contact: [name])   Tests administered: Wechsler Adult Intelligence Scale-4 (see Eloise Harman, PhD consult note on 08/19/2022 for full info) Vineland-3 Adaptive Behavior Scales Assessment completed on 09/13/2022 administered by Rosezetta Schlatter, MD  Subjective:   Patient was initially seen awake, no acute distress. She was pleasant, calm cooperative with evaluation. Patient reported no new concerns at this time. Reported she wants to go outside.   She continues to tolerate medications well, denied side effects. Endorsed stable and appropriate sleep and appetite.  Patient denied SI/HI/AVH, paranoia, and contracted to safety. Patient had no other questions or concerns and was amenable to plan.   Review of Systems  Constitutional:  Negative for malaise/fatigue.  HENT:  Negative for congestion.   Respiratory:  Negative for shortness of breath.   Cardiovascular:  Negative for chest pain.  Gastrointestinal:  Positive for constipation. Negative for nausea and vomiting.  Skin:   Positive for rash.  Neurological:  Negative for dizziness, tremors and headaches.    Diagnosis:  Final diagnoses:  Tobacco use disorder  Schizophrenia, unspecified type ([place])  MDD (major depressive disorder), recurrent episode, moderate ([facility])  Hypothyroidism, unspecified type    Total Time spent with patient: 15 minutes  Past Psychiatric History: schizoaffective, borderline IDD, anxiety, and MDD  Past Medical History:  Past Medical History:  Diagnosis Date   Anxiety    Depression    Hypothyroidism 08/07/2022   Tobacco use disorder 08/07/2022    Past Surgical History:  Procedure Laterality Date   WISDOM TOOTH EXTRACTION Bilateral 2020   Family History:  Family History  Problem Relation Age of Onset   Hypertension Father    Diabetes Father    Family Psychiatric  History: None reported Social History:  Social History   Substance and Sexual Activity  Alcohol Use Never     Social History   Substance and Sexual Activity  Drug Use Never    Social History   Socioeconomic History   Marital status: Single    Spouse name: Not on file   Number of children: Not on file   Years of education: Not on file   Highest education level: Not on file  Occupational History   Not on file  Tobacco Use   Smoking status: Every Day    Types: Cigarettes   Smokeless tobacco: Never  Vaping Use   Vaping Use: Some days  Substance and Sexual Activity   Alcohol use: Never   Drug use: Never   Sexual activity: Yes    Partners: Female    Birth control/protection: Implant  Other Topics Concern   Not on file  Social History  Narrative   Not on file   Social Determinants of Health   Financial Resource Strain: Not on file  Food Insecurity: Not on file  Transportation Needs: Not on file  Physical Activity: Not on file  Stress: Not on file  Social Connections: Not on file   SDOH:  SDOH Screenings   Depression (PHQ2-9): Low Risk  (06/10/2022)  Tobacco Use: High Risk (10/21/2022)    Additional Social History:     Sleep: Good  Appetite:  Good  Current Medications:  Current Facility-Administered Medications  Medication Dose Route Frequency Provider Last Rate Last Admin   acetaminophen (TYLENOL) tablet 650 mg  650 mg Oral Q6H PRN Haynes Kerns, NP   650 mg at 10/24/22 2013   alum & mag hydroxide-simeth (MAALOX/MYLANTA) 200-200-20 MG/5ML suspension 30 mL  30 mL Oral Q4H PRN Haynes Kerns, NP   30 mL at 10/20/22 2201   ARIPiprazole ER (ABILIFY MAINTENA) 400 MG prefilled syringe 400 mg  400 mg Intramuscular Q28 days Merrily Brittle, DO       cetaphil lotion   Topical PRN Merrily Brittle, DO       fluticasone (FLONASE) 50 MCG/ACT nasal spray 1 spray  1 spray Each Nare QHS Merrily Brittle, DO   1 spray at 10/24/22 2135   hydrOXYzine (ATARAX) tablet 25 mg  25 mg Oral TID PRN Christene Slates, MD   25 mg at 10/23/22 2110   ketoconazole (NIZORAL) 2 % cream   Topical BID Merrily Brittle, DO   Given at 10/24/22 2141   levothyroxine (SYNTHROID) tablet 100 mcg  100 mcg Oral Once Byungura, Veronique M, NP       levothyroxine (SYNTHROID) tablet 100 mcg  100 mcg Oral Q0600 Haynes Kerns, NP   100 mcg at 10/25/22 0618   lidocaine (LIDODERM) 5 % 1 patch  1 patch Transdermal Q24H Merrily Brittle, DO   1 patch at 10/24/22 2012   loratadine (CLARITIN) tablet 10 mg  10 mg Oral Daily Carrion-Carrero, Margely, MD   10 mg at 10/24/22 1010   magnesium hydroxide (MILK OF MAGNESIA) suspension 30 mL  30 mL Oral Daily PRN Maple Hudson, Veronique M, NP   30 mL at 10/12/22 2145   metFORMIN (GLUCOPHAGE) tablet 500 mg  500 mg Oral Q breakfast Carrion-Carrero, Margely, MD   500 mg at 10/24/22 1011   nicotine (NICODERM CQ - dosed in mg/24 hours) patch 14 mg  14 mg Transdermal Daily PRN Carrion-Carrero, Caryl Ada, MD       ondansetron (ZOFRAN-ODT) disintegrating tablet 4 mg  4 mg Oral Q8H PRN Carrion-Carrero, Margely, MD   4 mg at 10/22/22 1806   Oxcarbazepine (TRILEPTAL) tablet 300 mg   300 mg Oral BID Byungura, Veronique M, NP   300 mg at 10/24/22 2135   pantoprazole (PROTONIX) EC tablet 40 mg  40 mg Oral Daily Carrion-Carrero, Margely, MD   40 mg at 10/24/22 1011   polyethylene glycol (MIRALAX / GLYCOLAX) packet 17 g  17 g Oral Daily Merrily Brittle, DO   17 g at 10/24/22 1011   QUEtiapine (SEROQUEL) tablet 400 mg  400 mg Oral BID Maple Hudson, Veronique M, NP   400 mg at 10/24/22 2135   sertraline (ZOLOFT) tablet 150 mg  150 mg Oral Daily Carrion-Carrero, Margely, MD   150 mg at 10/24/22 1010   sodium chloride (OCEAN) 0.65 % nasal spray 1 spray  1 spray Each Nare Daily Merrily Brittle, DO   1 spray at 10/24/22 1012   traZODone ([place])  tablet 100 mg  100 mg Oral QHS Olin Pia M, NP   100 mg at 10/24/22 2135   traZODone (DESYREL) tablet 50 mg  50 mg Oral QHS PRN Marlou Sa, NP   50 mg at 10/21/22 2202   valACYclovir (VALTREX) tablet 500 mg  500 mg Oral Daily Carrion-Carrero, Margely, MD   500 mg at 10/24/22 1011   zinc oxide (BALMEX) 11.3 % cream   Topical PRN Princess Bruins, DO       Current Outpatient Medications  Medication Sig Dispense Refill   ABILIFY MAINTENA 400 MG PRSY prefilled syringe 400 mg every 28 (twenty-eight) days.     cetirizine (ZYRTEC) 10 MG tablet Take 10 mg by mouth daily.     cyclobenzaprine (FLEXERIL) 10 MG tablet Take 1 tablet (10 mg total) by mouth 2 (two) times daily as needed for muscle spasms. 20 tablet 0   fluticasone (FLONASE) 50 MCG/ACT nasal spray Place 1 spray into both nostrils daily.     meloxicam (MOBIC) 15 MG tablet Take 15 mg by mouth daily.     nitrofurantoin, macrocrystal-monohydrate, (MACROBID) 100 MG capsule Take 1 capsule (100 mg total) by mouth 2 (two) times daily. 10 capsule 0   ondansetron (ZOFRAN-ODT) 4 MG disintegrating tablet Take 1 tablet (4 mg total) by mouth every 8 (eight) hours as needed for nausea or vomiting. 20 tablet 0   Oxcarbazepine (TRILEPTAL) 300 MG tablet Take 1 tablet (300 mg total) by mouth 2 (two)  times daily. 60 tablet 0   QUEtiapine (SEROQUEL) 400 MG tablet Take 1 tablet (400 mg total) by mouth 2 (two) times daily. 60 tablet 0   sertraline (ZOLOFT) 50 MG tablet Take 3 tablets (150 mg total) by mouth in the morning. 90 tablet 0   traZODone (DESYREL) 100 MG tablet Take 1 tablet (100 mg total) by mouth at bedtime. 30 tablet 0   valACYclovir (VALTREX) 500 MG tablet Take 500 mg by mouth daily.      Labs  Lab Results:  Admission on 10/07/2022  Component Date Value Ref Range Status   Hgb A1c MFr Bld 10/15/2022 5.7 (H)  4.8 - 5.6 % Final   Comment: (NOTE)         Prediabetes: 5.7 - 6.4         Diabetes: >6.4         Glycemic control for adults with diabetes: <7.0    Mean Plasma Glucose 10/15/2022 117  mg/dL Final   Comment: (NOTE) Performed At: [facility] [address] [place], [place] [id number] Jolene Schimke MD [id number]   Admission on 10/07/2022, Discharged on 10/07/2022  Component Date Value Ref Range Status   Color, Urine 10/07/2022 YELLOW  YELLOW Final   APPearance 10/07/2022 HAZY (A)  CLEAR Final   Specific Gravity, Urine 10/07/2022 1.011  1.005 - 1.030 Final   pH 10/07/2022 7.0  5.0 - 8.0 Final   Glucose, UA 10/07/2022 NEGATIVE  NEGATIVE mg/dL Final   Hgb urine dipstick 10/07/2022 SMALL (A)  NEGATIVE Final   Bilirubin Urine 10/07/2022 NEGATIVE  NEGATIVE Final   Ketones, ur 10/07/2022 NEGATIVE  NEGATIVE mg/dL Final   Protein, ur 35/70/1779 NEGATIVE  NEGATIVE mg/dL Final   Nitrite 39/12/90 NEGATIVE  NEGATIVE Final   Leukocytes,Ua 10/07/2022 LARGE (A)  NEGATIVE Final   RBC / HPF 10/07/2022 0-5  0 - 5 RBC/hpf Final   WBC, UA 10/07/2022 0-5  0 - 5 WBC/hpf Final   Bacteria, UA 10/07/2022 FEW (A)  NONE SEEN Final  Squamous Epithelial / HPF 10/07/2022 11-20  0 - 5 Final   Performed at [facility], [place] [address], [place], [place] [address]   I-stat hCG, quantitative 10/07/2022 <5.0  <5 mIU/mL Final   Comment 3 10/07/2022          Final   Comment:    GEST. AGE      CONC.  (mIU/mL)   <=1 WEEK        5 - 50     2 WEEKS       50 - 500     3 WEEKS       100 - 10,000     4 WEEKS     1,000 - 30,000        FEMALE AND NON-PREGNANT FEMALE:     LESS THAN 5 mIU/mL    Sodium 10/07/2022 138  135 - 145 mmol/L Final   Potassium 10/07/2022 4.3  3.5 - 5.1 mmol/L Final   Chloride 10/07/2022 104  98 - 111 mmol/L Final   CO2 10/07/2022 28  22 - 32 mmol/L Final   Glucose, Bld 10/07/2022 90  70 - 99 mg/dL Final   Glucose reference range applies only to samples taken after fasting for at least 8 hours.   BUN 10/07/2022 8  6 - 20 mg/dL Final   Creatinine, Ser 10/07/2022 0.96  0.44 - 1.00 mg/dL Final   Calcium 10/07/2022 9.1  8.9 - 10.3 mg/dL Final   GFR, Estimated 10/07/2022 >60  >60 mL/min Final   Comment: (NOTE) Calculated using the CKD-EPI Creatinine Equation (2021)    Anion gap 10/07/2022 6  5 - 15 Final   Performed at [facility], [place] [address] [place], [place] [address]   WBC 10/07/2022 5.8  4.0 - 10.5 K/uL Final   RBC 10/07/2022 4.36  3.87 - 5.11 MIL/uL Final   Hemoglobin 10/07/2022 12.9  12.0 - 15.0 g/dL Final   HCT 10/07/2022 40.0  36.0 - 46.0 % Final   MCV 10/07/2022 91.7  80.0 - 100.0 fL Final   MCH 10/07/2022 29.6  26.0 - 34.0 pg Final   MCHC 10/07/2022 32.3  30.0 - 36.0 g/dL Final   RDW 10/07/2022 12.4  11.5 - 15.5 % Final   Platelets 10/07/2022 295  150 - 400 K/uL Final   nRBC 10/07/2022 0.0  0.0 - 0.2 % Final   Performed at [facility] [address], [place], [place] [address]   SARS Coronavirus 2 by RT PCR 10/07/2022 NEGATIVE  NEGATIVE Final   Comment: (NOTE) SARS-CoV-2 target nucleic acids are NOT DETECTED.  The SARS-CoV-2 RNA is generally detectable in upper respiratory specimens during the acute phase of infection. The lowest concentration of SARS-CoV-2 viral copies this assay can detect is 138 copies/mL. A negative result does not preclude SARS-Cov-2 infection and should not be used as the sole basis for  treatment or other patient management decisions. A negative result may occur with  improper specimen collection/handling, submission of specimen other than nasopharyngeal swab, presence of viral mutation(s) within the areas targeted by this assay, and inadequate number of viral copies(<138 copies/mL). A negative result must be combined with clinical observations, patient history, and epidemiological information. The expected result is Negative.  Fact Sheet for Patients:  EntrepreneurPulse.com.au  Fact Sheet for Healthcare Providers:  IncredibleEmployment.be  This test is no  t yet approved or cleared by the [place] and  has been authorized for detection and/or diagnosis of SARS-CoV-2 by FDA under an Emergency Use Authorization (EUA). This EUA will remain  in effect (meaning this test can be used) for the duration of the COVID-19 declaration under Section 564(b)(1) of the Act, 21 U.S.C.section 360bbb-3(b)(1), unless the authorization is terminated  or revoked sooner.       Influenza A by PCR 10/07/2022 NEGATIVE  NEGATIVE Final   Influenza B by PCR 10/07/2022 NEGATIVE  NEGATIVE Final   Comment: (NOTE) The Xpert Xpress SARS-CoV-2/FLU/RSV plus assay is intended as an aid in the diagnosis of influenza from Nasopharyngeal swab specimens and should not be used as a sole basis for treatment. Nasal washings and aspirates are unacceptable for Xpert Xpress SARS-CoV-2/FLU/RSV testing.  Fact Sheet for Patients: EntrepreneurPulse.com.au  Fact Sheet for Healthcare Providers: IncredibleEmployment.be  This test is not yet approved or cleared by the [place] FDA and has been authorized for detection and/or diagnosis of SARS-CoV-2 by FDA under an Emergency Use Authorization (EUA). This EUA will remain in effect (meaning this test can be used) for the duration of the COVID-19  declaration under Section 564(b)(1) of the Act, 21 U.S.C. section 360bbb-3(b)(1), unless the authorization is terminated or revoked.     Resp Syncytial Virus by PCR 10/07/2022 NEGATIVE  NEGATIVE Final   Comment: (NOTE) Fact Sheet for Patients: EntrepreneurPulse.com.au  Fact Sheet for Healthcare Providers: IncredibleEmployment.be  This test is not yet approved or cleared by the [place] FDA and has been authorized for detection and/or diagnosis of SARS-CoV-2 by FDA under an Emergency Use Authorization (EUA). This EUA will remain in effect (meaning this test can be used) for the duration of the COVID-19 declaration under Section 564(b)(1) of the Act, 21 U.S.C. section 360bbb-3(b)(1), unless the authorization is terminated or revoked.  Performed at [facility], [place] [address], [place], [place] [address]    Specimen Description 10/07/2022 URINE, CLEAN CATCH   Final   Special Requests 10/07/2022    Final                   Value:NONE Performed at [facility], [place] [address], [place], [place] [address]    Culture 10/07/2022 MULTIPLE SPECIES PRESENT, SUGGEST RECOLLECTION (A)   Final   Report Status 10/07/2022 10/08/2022 FINAL   Final  Admission on 07/25/2022, Discharged on 10/07/2022  Component Date Value Ref Range Status   SARS Coronavirus 2 by RT PCR 07/25/2022 NEGATIVE  NEGATIVE Final   Comment: (NOTE) SARS-CoV-2 target nucleic acids are NOT DETECTED.  The SARS-CoV-2 RNA is generally detectable in upper respiratory specimens during the acute phase of infection. The lowest concentration of SARS-CoV-2 viral copies this assay can detect is 138 copies/mL. A negative result does not preclude SARS-Cov-2 infection and should not be used as the sole basis for treatment or other patient management decisions. A negative result may occur with  improper specimen collection/handling, submission of specimen other than nasopharyngeal swab,  presence of viral mutation(s) within the areas targeted by this assay, and inadequate number of viral copies(<138 copies/mL). A negative result must be combined with clinical observations, patient history, and epidemiological information. The expected result is Negative.  Fact Sheet for Patients:  EntrepreneurPulse.com.au  Fact Sheet for Healthcare Providers:  IncredibleEmployment.be  This test is no  t yet approved or cleared by the [place] and  has been authorized for detection and/or diagnosis of SARS-CoV-2 by FDA under an Emergency Use Authorization (EUA). This EUA will remain  in effect (meaning this test can be used) for the duration of the COVID-19 declaration under Section 564(b)(1) of the Act, 21 U.S.C.section 360bbb-3(b)(1), unless the authorization is terminated  or revoked sooner.       Influenza A by PCR 07/25/2022 NEGATIVE  NEGATIVE Final   Influenza B by PCR 07/25/2022 NEGATIVE  NEGATIVE Final   Comment: (NOTE) The Xpert Xpress SARS-CoV-2/FLU/RSV plus assay is intended as an aid in the diagnosis of influenza from Nasopharyngeal swab specimens and should not be used as a sole basis for treatment. Nasal washings and aspirates are unacceptable for Xpert Xpress SARS-CoV-2/FLU/RSV testing.  Fact Sheet for Patients: EntrepreneurPulse.com.au  Fact Sheet for Healthcare Providers: IncredibleEmployment.be  This test is not yet approved or cleared by the [place] FDA and has been authorized for detection and/or diagnosis of SARS-CoV-2 by FDA under an Emergency Use Authorization (EUA). This EUA will remain in effect (meaning this test can be used) for the duration of the COVID-19 declaration under Section 564(b)(1) of the Act, 21 U.S.C. section 360bbb-3(b)(1), unless the authorization is terminated or revoked.  Performed at [facility], [place] [address] [place], [place] [address]    WBC 07/25/2022 8.3  4.0 - 10.5 K/uL Final   RBC 07/25/2022 4.44  3.87 - 5.11 MIL/uL Final   Hemoglobin 07/25/2022 13.7  12.0 - 15.0 g/dL Final   HCT 07/25/2022 40.2  36.0 - 46.0 % Final   MCV 07/25/2022 90.5  80.0 - 100.0 fL Final   MCH 07/25/2022 30.9  26.0 - 34.0 pg Final   MCHC 07/25/2022 34.1  30.0 - 36.0 g/dL Final   RDW 07/25/2022 12.2  11.5 - 15.5 % Final   Platelets 07/25/2022 248  150 - 400 K/uL Final   nRBC 07/25/2022 0.0  0.0 - 0.2 % Final   Neutrophils Relative % 07/25/2022 43  % Final   Neutro Abs 07/25/2022 3.6  1.7 - 7.7 K/uL Final   Lymphocytes Relative 07/25/2022 52  % Final   Lymphs Abs 07/25/2022 4.2 (H)  0.7 - 4.0 K/uL Final   Monocytes Relative 07/25/2022 5  % Final   Monocytes Absolute 07/25/2022 0.4  0.1 - 1.0 K/uL Final   Eosinophils Relative 07/25/2022 0  % Final   Eosinophils Absolute 07/25/2022 0.0  0.0 - 0.5 K/uL Final   Basophils Relative 07/25/2022 0  % Final   Basophils Absolute 07/25/2022 0.0  0.0 - 0.1 K/uL Final   Immature Granulocytes 07/25/2022 0  % Final   Abs Immature Granulocytes 07/25/2022 0.02  0.00 - 0.07 K/uL Final   Performed at [facility], [place] [address] [place], [place] [address]   Sodium 07/25/2022 138  135 - 145 mmol/L Final   Potassium 07/25/2022 4.0  3.5 - 5.1 mmol/L Final   Chloride 07/25/2022 104  98 - 111 mmol/L Final   CO2 07/25/2022 29  22 - 32 mmol/L Final   Glucose, Bld 07/25/2022 83  70 - 99 mg/dL Final   Glucose reference range applies only to samples taken after fasting for at least 8 hours.   BUN 07/25/2022 11  6 - 20 mg/dL Final   Creatinine, Ser 07/25/2022 0.97  0.44 - 1.00 mg/dL Final   Calcium 07/25/2022 9.2  8.9 - 10.3 mg/dL Final   Total  Protein 07/25/2022 7.0  6.5 - 8.1 g/dL Final   Albumin 07/25/2022 3.8  3.5 - 5.0 g/dL Final   AST 07/25/2022 18  15 - 41 U/L Final   ALT 07/25/2022 22  0 - 44 U/L Final   Alkaline Phosphatase 07/25/2022 64  38 - 126 U/L Final   Total  Bilirubin 07/25/2022 0.2 (L)  0.3 - 1.2 mg/dL Final   GFR, Estimated 07/25/2022 >60  >60 mL/min Final   Comment: (NOTE) Calculated using the CKD-EPI Creatinine Equation (2021)    Anion gap 07/25/2022 5  5 - 15 Final   Performed at [facility], [place] [address] [place], [place] [address]   POC Amphetamine UR 07/25/2022 None Detected  NONE DETECTED (Cut Off Level 1000 ng/mL) Preliminary   POC Secobarbital (BAR) 07/25/2022 None Detected  NONE DETECTED (Cut Off Level 300 ng/mL) Preliminary   POC Buprenorphine (BUP) 07/25/2022 None Detected  NONE DETECTED (Cut Off Level 10 ng/mL) Preliminary   POC Oxazepam (BZO) 07/25/2022 None Detected  NONE DETECTED (Cut Off Level 300 ng/mL) Preliminary   POC Cocaine UR 07/25/2022 None Detected  NONE DETECTED (Cut Off Level 300 ng/mL) Preliminary   POC Methamphetamine UR 07/25/2022 None Detected  NONE DETECTED (Cut Off Level 1000 ng/mL) Preliminary   POC Morphine 07/25/2022 None Detected  NONE DETECTED (Cut Off Level 300 ng/mL) Preliminary   POC Methadone UR 07/25/2022 None Detected  NONE DETECTED (Cut Off Level 300 ng/mL) Preliminary   POC Oxycodone UR 07/25/2022 None Detected  NONE DETECTED (Cut Off Level 100 ng/mL) Preliminary   POC Marijuana UR 07/25/2022 None Detected  NONE DETECTED (Cut Off Level 50 ng/mL) Preliminary   SARSCOV2ONAVIRUS 2 AG 07/25/2022 NEGATIVE  NEGATIVE Final   Comment: (NOTE) SARS-CoV-2 antigen NOT DETECTED.   Negative results are presumptive.  Negative results do not preclude SARS-CoV-2 infection and should not be used as the sole basis for treatment or other patient management decisions, including infection  control decisions, particularly in the presence of clinical signs and  symptoms consistent with COVID-19, or in those who have been in contact with the virus.  Negative results must be combined with clinical observations, patient history, and epidemiological information. The expected result is Negative.  Fact Sheet for  Patients: HandmadeRecipes.com.cy  Fact Sheet for Healthcare Providers: FuneralLife.at  This test is not yet approved or cleared by the [place] FDA and  has been authorized for detection and/or diagnosis of SARS-CoV-2 by FDA under an Emergency Use Authorization (EUA).  This EUA will remain in effect (meaning this test can be used) for the duration of  the COV                          ID-19 declaration under Section 564(b)(1) of the Act, 21 U.S.C. section 360bbb-3(b)(1), unless the authorization is terminated or revoked sooner.     Cholesterol 07/25/2022 181  0 - 200 mg/dL Final   Triglycerides 07/25/2022 118  <150 mg/dL Final   HDL 07/25/2022 45  >40 mg/dL Final   Total CHOL/HDL Ratio 07/25/2022 4.0  RATIO Final   VLDL 07/25/2022 24  0 - 40 mg/dL Final   LDL Cholesterol 07/25/2022 112 (H)  0 - 99 mg/dL Final   Comment:        Total Cholesterol/HDL:CHD Risk Coronary Heart Disease Risk Table                     Men   Women  1/2 Average Risk   3.4   3.3  Average Risk       5.0   4.4  2 X Average Risk   9.6   7.1  3 X Average Risk  23.4   11.0        Use the calculated Patient Ratio above and the CHD Risk Table to determine the patient's CHD Risk.        ATP III CLASSIFICATION (LDL):  <100     mg/dL   Optimal  100-129  mg/dL   Near or Above                    Optimal  130-159  mg/dL   Borderline  160-189  mg/dL   High  >190     mg/dL   Very High Performed at [facility] [address], [place], [place] [address]    TSH 07/25/2022 6.668 (H)  0.350 - 4.500 uIU/mL Final   Comment: Performed by a 3rd Generation assay with a functional sensitivity of <=0.01 uIU/mL. Performed at [facility], [place] [address] [place], [place] [address]    Glucose-Capillary 07/26/2022 104 (H)  70 - 99 mg/dL Final   Glucose reference range applies only to samples taken after fasting for at least 8 hours.   T3, Free 07/31/2022 2.3   2.0 - 4.4 pg/mL Final   Comment: (NOTE) Performed At: [facility] [place], [place] [id number] Rush Farmer MD [id number]    Free T4 07/31/2022 0.60 (L)  0.61 - 1.12 ng/dL Final   Comment: (NOTE) Biotin ingestion may interfere with free T4 tests. If the results are inconsistent with the TSH level, previous test results, or the clinical presentation, then consider biotin interference. If needed, order repeat testing after stopping biotin. Performed at [facility], [place] [address] [place], [place] [address]    Glucose-Capillary 08/29/2022 100 (H)  70 - 99 mg/dL Final   Glucose reference range applies only to samples taken after fasting for at least 8 hours.   TSH 09/05/2022 0.793  0.350 - 4.500 uIU/mL Final   Comment: Performed by a 3rd Generation assay with a functional sensitivity of <=0.01 uIU/mL. Performed at [facility], [place] [address], [place],  [address]    Free T4 09/05/2022 0.73  0.61 - 1.12 ng/dL Final   Comment: (NOTE) Biotin ingestion may interfere with free T4 tests. If the results are inconsistent with the TSH level, previous test results, or the clinical presentation, then consider biotin interference. If needed, order repeat testing after stopping biotin. Performed at [facility], [place] [address] [place],  [address]    Preg Test, Ur 09/06/2022 Negative  Negative Final   Preg Test, Ur 09/06/2022 NEGATIVE  NEGATIVE Final   Comment:        THE SENSITIVITY OF THIS METHODOLOGY IS >24 mIU/mL    Preg Test, Ur 09/05/2022 NEGATIVE  NEGATIVE Final   Comment:        THE SENSITIVITY OF THIS METHODOLOGY IS >24 mIU/mL    Sodium 09/13/2022 136  135 - 145 mmol/L Final   Potassium 09/13/2022 4.5  3.5 - 5.1 mmol/L Final   Chloride 09/13/2022 105  98 - 111 mmol/L Final   CO2 09/13/2022 21 (L)  22 - 32 mmol/L Final   Glucose, Bld 09/13/2022 111 (H)  70 - 99 mg/dL Final   Glucose reference range applies only to  samples taken  after fasting for at least 8 hours.   BUN 09/13/2022 14  6 - 20 mg/dL Final   Creatinine, Ser 09/13/2022 0.92  0.44 - 1.00 mg/dL Final   Calcium 09/13/2022 9.1  8.9 - 10.3 mg/dL Final   GFR, Estimated 09/13/2022 >60  >60 mL/min Final   Comment: (NOTE) Calculated using the CKD-EPI Creatinine Equation (2021)    Anion gap 09/13/2022 10  5 - 15 Final   Performed at [facility], [place] [address] [place], [place] [address]   Vitamin B-12 09/13/2022 585  180 - 914 pg/mL Final   Comment: (NOTE) This assay is not validated for testing neonatal or myeloproliferative syndrome specimens for Vitamin B12 levels. Performed at [facility], [place] [address], [place], [place] [address]    Color, Urine 09/14/2022 YELLOW  YELLOW Final   APPearance 09/14/2022 HAZY (A)  CLEAR Final   Specific Gravity, Urine 09/14/2022 1.025  1.005 - 1.030 Final   pH 09/14/2022 5.0  5.0 - 8.0 Final   Glucose, UA 09/14/2022 NEGATIVE  NEGATIVE mg/dL Final   Hgb urine dipstick 09/14/2022 NEGATIVE  NEGATIVE Final   Bilirubin Urine 09/14/2022 NEGATIVE  NEGATIVE Final   Ketones, ur 09/14/2022 NEGATIVE  NEGATIVE mg/dL Final   Protein, ur 09/14/2022 NEGATIVE  NEGATIVE mg/dL Final   Nitrite 09/14/2022 NEGATIVE  NEGATIVE Final   Leukocytes,Ua 09/14/2022 TRACE (A)  NEGATIVE Final   RBC / HPF 09/14/2022 0-5  0 - 5 RBC/hpf Final   WBC, UA 09/14/2022 0-5  0 - 5 WBC/hpf Final   Bacteria, UA 09/14/2022 RARE (A)  NONE SEEN Final   Squamous Epithelial / HPF 09/14/2022 0-5  0 - 5 Final   Mucus 09/14/2022 PRESENT   Final   Performed at [facility], [place] [address], [place], [place] [address]   Glucose-Capillary 09/16/2022 105 (H)  70 - 99 mg/dL Final   Glucose reference range applies only to samples taken after fasting for at least 8 hours.   SARS Coronavirus 2 by RT PCR 09/30/2022 NEGATIVE  NEGATIVE Final   Comment: (NOTE) SARS-CoV-2 target nucleic acids are NOT DETECTED.  The SARS-CoV-2 RNA is generally  detectable in upper respiratory specimens during the acute phase of infection. The lowest concentration of SARS-CoV-2 viral copies this assay can detect is 138 copies/mL. A negative result does not preclude SARS-Cov-2 infection and should not be used as the sole basis for treatment or other patient management decisions. A negative result may occur with  improper specimen collection/handling, submission of specimen other than nasopharyngeal swab, presence of viral mutation(s) within the areas targeted by this assay, and inadequate number of viral copies(<138 copies/mL). A negative result must be combined with clinical observations, patient history, and epidemiological information. The expected result is Negative.  Fact Sheet for Patients:  EntrepreneurPulse.com.au  Fact Sheet for Healthcare Providers:  IncredibleEmployment.be  This test is no                          t yet approved or cleared by the [place] FDA and  has been authorized for detection and/or diagnosis of SARS-CoV-2 by FDA under an Emergency Use Authorization (EUA). This EUA will remain  in effect (meaning this test can be used) for the duration of the COVID-19 declaration under Section 564(b)(1) of the Act, 21 U.S.C.section 360bbb-3(b)(1), unless the authorization is terminated  or revoked sooner.       Influenza A by PCR 09/30/2022 NEGATIVE  NEGATIVE Final   Influenza B by PCR 09/30/2022 NEGATIVE  NEGATIVE Final   Comment: (NOTE) The Xpert Xpress SARS-CoV-2/FLU/RSV plus assay is intended as an aid in the diagnosis of influenza from Nasopharyngeal swab specimens and should not be used as a sole basis for treatment. Nasal washings and aspirates are unacceptable for Xpert Xpress SARS-CoV-2/FLU/RSV testing.  Fact Sheet for Patients: EntrepreneurPulse.com.au  Fact Sheet for Healthcare Providers: IncredibleEmployment.be  This test is not  yet approved or cleared by the [place] FDA and has been authorized for detection and/or diagnosis of SARS-CoV-2 by FDA under an Emergency Use Authorization (EUA). This EUA will remain in effect (meaning this test can be used) for the duration of the COVID-19 declaration under Section 564(b)(1) of the Act, 21 U.S.C. section 360bbb-3(b)(1), unless the authorization is terminated or revoked.     Resp Syncytial Virus by PCR 09/30/2022 NEGATIVE  NEGATIVE Final   Comment: (NOTE) Fact Sheet for Patients: EntrepreneurPulse.com.au  Fact Sheet for Healthcare Providers: IncredibleEmployment.be  This test is not yet approved or cleared by the [place] FDA and has been authorized for detection and/or diagnosis of SARS-CoV-2 by FDA under an Emergency Use Authorization (EUA). This EUA will remain in effect (meaning this test can be used) for the duration of the COVID-19 declaration under Section 564(b)(1) of the Act, 21 U.S.C. section 360bbb-3(b)(1), unless the authorization is terminated or revoked.  Performed at [facility], [place] [address], [place], [place] [address]    Sodium 10/01/2022 136  135 - 145 mmol/L Final   Potassium 10/01/2022 3.8  3.5 - 5.1 mmol/L Final   Chloride 10/01/2022 104  98 - 111 mmol/L Final   CO2 10/01/2022 27  22 - 32 mmol/L Final   Glucose, Bld 10/01/2022 98  70 - 99 mg/dL Final   Glucose reference range applies only to samples taken after fasting for at least 8 hours.   BUN 10/01/2022 12  6 - 20 mg/dL Final   Creatinine, Ser 10/01/2022 0.98  0.44 - 1.00 mg/dL Final   Calcium 10/01/2022 8.9  8.9 - 10.3 mg/dL Final   GFR, Estimated 10/01/2022 >60  >60 mL/min Final   Comment: (NOTE) Calculated using the CKD-EPI Creatinine Equation (2021)    Anion gap 10/01/2022 5  5 - 15 Final   Performed at [facility], [place] [address], [place], [place] [address]   WBC 10/01/2022 5.1  4.0 - 10.5 K/uL Final   RBC  10/01/2022 4.31  3.87 - 5.11 MIL/uL Final   Hemoglobin 10/01/2022 12.7  12.0 - 15.0 g/dL Final   HCT 10/01/2022 38.8  36.0 - 46.0 % Final   MCV 10/01/2022 90.0  80.0 - 100.0 fL Final   MCH 10/01/2022 29.5  26.0 - 34.0 pg Final   MCHC 10/01/2022 32.7  30.0 - 36.0 g/dL Final   RDW 10/01/2022 12.4  11.5 - 15.5 % Final   Platelets 10/01/2022 300  150 - 400 K/uL Final   nRBC 10/01/2022 0.0  0.0 - 0.2 % Final   Performed at [place] [address] [place], [place] [address]   Lipase 10/01/2022 29  11 - 51 U/L Final   Performed at [place] [address], [place], [place] [address]   Color, Urine 10/05/2022 YELLOW  YELLOW Final   APPearance 10/05/2022 HAZY (A)  CLEAR Final   Specific Gravity, Urine 10/05/2022 1.018  1.005 - 1.030 Final   pH 10/05/2022 5.0  5.0 - 8.0  Final   Glucose, UA 10/05/2022 NEGATIVE  NEGATIVE mg/dL Final   Hgb urine dipstick 10/05/2022 NEGATIVE  NEGATIVE Final   Bilirubin Urine 10/05/2022 NEGATIVE  NEGATIVE Final   Ketones, ur 10/05/2022 NEGATIVE  NEGATIVE mg/dL Final   Protein, ur 10/05/2022 NEGATIVE  NEGATIVE mg/dL Final   Nitrite 10/05/2022 NEGATIVE  NEGATIVE Final   Leukocytes,Ua 10/05/2022 TRACE (A)  NEGATIVE Final   RBC / HPF 10/05/2022 0-5  0 - 5 RBC/hpf Final   WBC, UA 10/05/2022 0-5  0 - 5 WBC/hpf Final   Bacteria, UA 10/05/2022 RARE (A)  NONE SEEN Final   Squamous Epithelial / HPF 10/05/2022 0-5  0 - 5 Final   Mucus 10/05/2022 PRESENT   Final   Performed at [facility], [place] [address] [place], [place] [address]   Specimen Description 10/05/2022 URINE, CLEAN CATCH   Final   Special Requests 10/05/2022    Final                   Value:NONE Performed at [facility], [place] [address] [place], [place] [address]    Culture 10/05/2022 10,000 COLONIES/mL MULTIPLE SPECIES PRESENT, SUGGEST RECOLLECTION (A)   Final   Report Status 10/05/2022 10/07/2022 FINAL   Final  Admission on 07/04/2022, Discharged on 07/05/2022  Component  Date Value Ref Range Status   Sodium 07/04/2022 142  135 - 145 mmol/L Final   Potassium 07/04/2022 4.4  3.5 - 5.1 mmol/L Final   Chloride 07/04/2022 109  98 - 111 mmol/L Final   CO2 07/04/2022 26  22 - 32 mmol/L Final   Glucose, Bld 07/04/2022 96  70 - 99 mg/dL Final   Glucose reference range applies only to samples taken after fasting for at least 8 hours.   BUN 07/04/2022 15  6 - 20 mg/dL Final   Creatinine, Ser 07/04/2022 0.83  0.44 - 1.00 mg/dL Final   Calcium 07/04/2022 9.3  8.9 - 10.3 mg/dL Final   Total Protein 07/04/2022 7.2  6.5 - 8.1 g/dL Final   Albumin 07/04/2022 3.9  3.5 - 5.0 g/dL Final   AST 07/04/2022 23  15 - 41 U/L Final   ALT 07/04/2022 28  0 - 44 U/L Final   Alkaline Phosphatase 07/04/2022 77  38 - 126 U/L Final   Total Bilirubin 07/04/2022 0.4  0.3 - 1.2 mg/dL Final   GFR, Estimated 07/04/2022 >60  >60 mL/min Final   Comment: (NOTE) Calculated using the CKD-EPI Creatinine Equation (2021)    Anion gap 07/04/2022 7  5 - 15 Final   Performed at [facility], [place] [address] [place], [place] [address]   Alcohol, Ethyl (B) 07/04/2022 <10  <10 mg/dL Final   Comment: (NOTE) Lowest detectable limit for serum alcohol is 10 mg/dL.  For medical purposes only. Performed at [facility], [place] [address], [place], [place] [address]    WBC 07/04/2022 7.0  4.0 - 10.5 K/uL Final   RBC 07/04/2022 4.18  3.87 - 5.11 MIL/uL Final   Hemoglobin 07/04/2022 12.8  12.0 - 15.0 g/dL Final   HCT 07/04/2022 39.1  36.0 - 46.0 % Final   MCV 07/04/2022 93.5  80.0 - 100.0 fL Final   MCH 07/04/2022 30.6  26.0 - 34.0 pg Final   MCHC 07/04/2022 32.7  30.0 - 36.0 g/dL Final   RDW 07/04/2022 12.9  11.5 - 15.5 % Final   Platelets 07/04/2022 243  150 - 400 K/uL Final  nRBC 07/04/2022 0.0  0.0 - 0.2 % Final   Neutrophils Relative % 07/04/2022 43  % Final   Neutro Abs 07/04/2022 3.0  1.7 - 7.7 K/uL Final   Lymphocytes Relative 07/04/2022 50  % Final   Lymphs  Abs 07/04/2022 3.5  0.7 - 4.0 K/uL Final   Monocytes Relative 07/04/2022 7  % Final   Monocytes Absolute 07/04/2022 0.5  0.1 - 1.0 K/uL Final   Eosinophils Relative 07/04/2022 0  % Final   Eosinophils Absolute 07/04/2022 0.0  0.0 - 0.5 K/uL Final   Basophils Relative 07/04/2022 0  % Final   Basophils Absolute 07/04/2022 0.0  0.0 - 0.1 K/uL Final   Immature Granulocytes 07/04/2022 0  % Final   Abs Immature Granulocytes 07/04/2022 0.01  0.00 - 0.07 K/uL Final   Performed at [facility], [place] [address], [place], [place] [address]   I-stat hCG, quantitative 07/04/2022 <5.0  <5 mIU/mL Final   Comment 3 07/04/2022          Final   Comment:   GEST. AGE      CONC.  (mIU/mL)   <=1 WEEK        5 - 50     2 WEEKS       50 - 500     3 WEEKS       100 - 10,000     4 WEEKS     1,000 - 30,000        FEMALE AND NON-PREGNANT FEMALE:     LESS THAN 5 mIU/mL   Admission on 06/14/2022, Discharged on 06/14/2022  Component Date Value Ref Range Status   Color, UA 06/14/2022 yellow  yellow Final   Clarity, UA 06/14/2022 cloudy (A)  clear Final   Glucose, UA 06/14/2022 negative  negative mg/dL Final   Bilirubin, UA 06/14/2022 negative  negative Final   Ketones, POC UA 06/14/2022 negative  negative mg/dL Final   Spec Grav, UA 06/14/2022 1.025  1.010 - 1.025 Final   Blood, UA 06/14/2022 negative  negative Final   pH, UA 06/14/2022 7.5  5.0 - 8.0 Final   Protein Ur, POC 06/14/2022 =30 (A)  negative mg/dL Final   Urobilinogen, UA 06/14/2022 0.2  0.2 or 1.0 E.U./dL Final   Nitrite, UA 06/14/2022 Negative  Negative Final   Leukocytes, UA 06/14/2022 Negative  Negative Final   Preg Test, Ur 06/14/2022 Negative  Negative Final   Specimen Description 06/14/2022 URINE, CLEAN CATCH   Final   Special Requests 06/14/2022    Final                   Value:NONE Performed at [facility], [place] [address] [place], [place] [address]    Culture 06/14/2022 MULTIPLE SPECIES PRESENT, SUGGEST  RECOLLECTION (A)   Final   Report Status 06/14/2022 06/16/2022 FINAL   Final  Admission on 06/07/2022, Discharged on 06/10/2022  Component Date Value Ref Range Status   SARS Coronavirus 2 by RT PCR 06/07/2022 NEGATIVE  NEGATIVE Final   Comment: (NOTE) SARS-CoV-2 target nucleic acids are NOT DETECTED.  The SARS-CoV-2 RNA is generally detectable in upper respiratory specimens during the acute phase of infection. The lowest concentration of SARS-CoV-2 viral copies this assay can detect is 138 copies/mL. A negative result does not preclude SARS-Cov-2 infection and should not be used as the sole basis for treatment or other patient management decisions. A negative result may occur with  improper specimen collection/handling, submission of  specimen other than nasopharyngeal swab, presence of viral mutation(s) within the areas targeted by this assay, and inadequate number of viral copies(<138 copies/mL). A negative result must be combined with clinical observations, patient history, and epidemiological information. The expected result is Negative.  Fact Sheet for Patients:  BloggerCourse.com  Fact Sheet for Healthcare Providers:  SeriousBroker.it  This test is no                          t yet approved or cleared by the [place] FDA and  has been authorized for detection and/or diagnosis of SARS-CoV-2 by FDA under an Emergency Use Authorization (EUA). This EUA will remain  in effect (meaning this test can be used) for the duration of the COVID-19 declaration under Section 564(b)(1) of the Act, 21 U.S.C.section 360bbb-3(b)(1), unless the authorization is terminated  or revoked sooner.       Influenza A by PCR 06/07/2022 NEGATIVE  NEGATIVE Final   Influenza B by PCR 06/07/2022 NEGATIVE  NEGATIVE Final   Comment: (NOTE) The Xpert Xpress SARS-CoV-2/FLU/RSV plus assay is intended as an aid in the diagnosis of influenza from  Nasopharyngeal swab specimens and should not be used as a sole basis for treatment. Nasal washings and aspirates are unacceptable for Xpert Xpress SARS-CoV-2/FLU/RSV testing.  Fact Sheet for Patients: BloggerCourse.com  Fact Sheet for Healthcare Providers: SeriousBroker.it  This test is not yet approved or cleared by the [place] FDA and has been authorized for detection and/or diagnosis of SARS-CoV-2 by FDA under an Emergency Use Authorization (EUA). This EUA will remain in effect (meaning this test can be used) for the duration of the COVID-19 declaration under Section 564(b)(1) of the Act, 21 U.S.C. section 360bbb-3(b)(1), unless the authorization is terminated or revoked.  Performed at [facility], [address], [place], [place] [address]    WBC 06/07/2022 5.7  4.0 - 10.5 K/uL Final   RBC 06/07/2022 4.39  3.87 - 5.11 MIL/uL Final   Hemoglobin 06/07/2022 13.2  12.0 - 15.0 g/dL Final   HCT 66/29/4765 40.4  36.0 - 46.0 % Final   MCV 06/07/2022 92.0  80.0 - 100.0 fL Final   MCH 06/07/2022 30.1  26.0 - 34.0 pg Final   MCHC 06/07/2022 32.7  30.0 - 36.0 g/dL Final   RDW 46/50/3546 12.4  11.5 - 15.5 % Final   Platelets 06/07/2022 308  150 - 400 K/uL Final   nRBC 06/07/2022 0.0  0.0 - 0.2 % Final   Neutrophils Relative % 06/07/2022 42  % Final   Neutro Abs 06/07/2022 2.4  1.7 - 7.7 K/uL Final   Lymphocytes Relative 06/07/2022 54  % Final   Lymphs Abs 06/07/2022 3.1  0.7 - 4.0 K/uL Final   Monocytes Relative 06/07/2022 4  % Final   Monocytes Absolute 06/07/2022 0.2  0.1 - 1.0 K/uL Final   Eosinophils Relative 06/07/2022 0  % Final   Eosinophils Absolute 06/07/2022 0.0  0.0 - 0.5 K/uL Final   Basophils Relative 06/07/2022 0  % Final   Basophils Absolute 06/07/2022 0.0  0.0 - 0.1 K/uL Final   Immature Granulocytes 06/07/2022 0  % Final   Abs Immature Granulocytes 06/07/2022 0.01  0.00 - 0.07 K/uL Final   Performed at  [facility], [address], [place], [place] [address]   Sodium 06/07/2022 139  135 - 145 mmol/L Final   Potassium 06/07/2022 4.0  3.5 - 5.1 mmol/L Final  Chloride 06/07/2022 104  98 - 111 mmol/L Final   CO2 06/07/2022 28  22 - 32 mmol/L Final   Glucose, Bld 06/07/2022 104 (H)  70 - 99 mg/dL Final   Glucose reference range applies only to samples taken after fasting for at least 8 hours.   BUN 06/07/2022 8  6 - 20 mg/dL Final   Creatinine, Ser 06/07/2022 0.84  0.44 - 1.00 mg/dL Final   Calcium 06/07/2022 9.1  8.9 - 10.3 mg/dL Final   Total Protein 06/07/2022 6.9  6.5 - 8.1 g/dL Final   Albumin 06/07/2022 3.7  3.5 - 5.0 g/dL Final   AST 06/07/2022 19  15 - 41 U/L Final   ALT 06/07/2022 22  0 - 44 U/L Final   Alkaline Phosphatase 06/07/2022 54  38 - 126 U/L Final   Total Bilirubin 06/07/2022 0.4  0.3 - 1.2 mg/dL Final   GFR, Estimated 06/07/2022 >60  >60 mL/min Final   Comment: (NOTE) Calculated using the CKD-EPI Creatinine Equation (2021)    Anion gap 06/07/2022 7  5 - 15 Final   Performed at [place] [address], [place], [place] [address]   Hgb A1c MFr Bld 06/07/2022 5.0  4.8 - 5.6 % Final   Comment: (NOTE) Pre diabetes:          5.7%-6.4%  Diabetes:              >6.4%  Glycemic control for   <7.0% adults with diabetes    Mean Plasma Glucose 06/07/2022 96.8  mg/dL Final   Performed at [facility], [place] [address], [place], [place] [address]   TSH 06/07/2022 1.620  0.350 - 4.500 uIU/mL Final   Comment: Performed by a 3rd Generation assay with a functional sensitivity of <=0.01 uIU/mL. Performed at [facility], [place] [address], [place], [place] [address]    RPR Ser Ql 06/07/2022 NON REACTIVE  NON REACTIVE Final   Performed at [facility], [place] [address], [place], [place] [address]   Color, Urine 06/07/2022 YELLOW  YELLOW Final   APPearance 06/07/2022 HAZY (A)  CLEAR Final   Specific Gravity, Urine 06/07/2022 1.018  1.005 - 1.030 Final    pH 06/07/2022 7.0  5.0 - 8.0 Final   Glucose, UA 06/07/2022 NEGATIVE  NEGATIVE mg/dL Final   Hgb urine dipstick 06/07/2022 NEGATIVE  NEGATIVE Final   Bilirubin Urine 06/07/2022 NEGATIVE  NEGATIVE Final   Ketones, ur 06/07/2022 NEGATIVE  NEGATIVE mg/dL Final   Protein, ur 06/07/2022 NEGATIVE  NEGATIVE mg/dL Final   Nitrite 06/07/2022 NEGATIVE  NEGATIVE Final   Leukocytes,Ua 06/07/2022 NEGATIVE  NEGATIVE Final   Performed at [facility], [place] [address], [place], [place] [address]   Cholesterol 06/07/2022 164  0 - 200 mg/dL Final   Triglycerides 06/07/2022 158 (H)  <150 mg/dL Final   HDL 06/07/2022 44  >40 mg/dL Final   Total CHOL/HDL Ratio 06/07/2022 3.7  RATIO Final   VLDL 06/07/2022 32  0 - 40 mg/dL Final   LDL Cholesterol 06/07/2022 88  0 - 99 mg/dL Final   Comment:        Total Cholesterol/HDL:CHD Risk Coronary Heart Disease Risk Table                     Men   Women  1/2 Average Risk   3.4   3.3  Average Risk       5.0   4.4  2 X Average Risk   9.6   7.1  3 X Average Risk  23.4   11.0        Use the calculated Patient Ratio above and the CHD Risk Table to determine the patient's CHD Risk.        ATP III CLASSIFICATION (LDL):  <100     mg/dL   Optimal  100-129  mg/dL   Near or Above                    Optimal  130-159  mg/dL   Borderline  160-189  mg/dL   High  >190     mg/dL   Very High Performed at [place] [address] [place], [place] [address]    HIV Screen 4th Generation wRfx 06/07/2022 Non Reactive  Non Reactive Final   Performed at [facility], [place] [address] [place], [place] [address]   SARSCOV2ONAVIRUS 2 AG 06/07/2022 NEGATIVE  NEGATIVE Final   Comment: (NOTE) SARS-CoV-2 antigen NOT DETECTED.   Negative results are presumptive.  Negative results do not preclude SARS-CoV-2 infection and should not be used as the sole basis for treatment or other patient management decisions, including infection  control decisions, particularly  in the presence of clinical signs and  symptoms consistent with COVID-19, or in those who have been in contact with the virus.  Negative results must be combined with clinical observations, patient history, and epidemiological information. The expected result is Negative.  Fact Sheet for Patients: HandmadeRecipes.com.cy  Fact Sheet for Healthcare Providers: FuneralLife.at  This test is not yet approved or cleared by the [place] FDA and  has been authorized for detection and/or diagnosis of SARS-CoV-2 by FDA under an Emergency Use Authorization (EUA).  This EUA will remain in effect (meaning this test can be used) for the duration of  the COV                          ID-19 declaration under Section 564(b)(1) of the Act, 21 U.S.C. section 360bbb-3(b)(1), unless the authorization is terminated or revoked sooner.     POC Amphetamine UR 06/07/2022 None Detected  NONE DETECTED (Cut Off Level 1000 ng/mL) Final   POC Secobarbital (BAR) 06/07/2022 None Detected  NONE DETECTED (Cut Off Level 300 ng/mL) Final   POC Buprenorphine (BUP) 06/07/2022 None Detected  NONE DETECTED (Cut Off Level 10 ng/mL) Final   POC Oxazepam (BZO) 06/07/2022 None Detected  NONE DETECTED (Cut Off Level 300 ng/mL) Final   POC Cocaine UR 06/07/2022 None Detected  NONE DETECTED (Cut Off Level 300 ng/mL) Final   POC Methamphetamine UR 06/07/2022 None Detected  NONE DETECTED (Cut Off Level 1000 ng/mL) Final   POC Morphine 06/07/2022 None Detected  NONE DETECTED (Cut Off Level 300 ng/mL) Final   POC Methadone UR 06/07/2022 None Detected  NONE DETECTED (Cut Off Level 300 ng/mL) Final   POC Oxycodone UR 06/07/2022 None Detected  NONE DETECTED (Cut Off Level 100 ng/mL) Final   POC Marijuana UR 06/07/2022 None Detected  NONE DETECTED (Cut Off Level 50 ng/mL) Final   Preg Test, Ur 06/08/2022 NEGATIVE  NEGATIVE Final   Comment:        THE SENSITIVITY OF THIS METHODOLOGY IS >20  mIU/mL. Performed at [facility], [place] [address] [place], [place] [address]    Preg Test, Ur 06/08/2022 NEGATIVE  NEGATIVE Final   Comment:  THE SENSITIVITY OF THIS METHODOLOGY IS >24 mIU/mL     Blood Alcohol level:  Lab Results  Component Value Date   ETH <10 07/04/2022   ETH <10 [id number]    Metabolic Disorder Labs: Lab Results  Component Value Date   HGBA1C 5.7 (H) 10/15/2022   MPG 117 10/15/2022   MPG 96.8 06/07/2022   No results found for: "PROLACTIN" Lab Results  Component Value Date   CHOL 181 07/25/2022   TRIG 118 07/25/2022   HDL 45 07/25/2022   CHOLHDL 4.0 07/25/2022   VLDL 24 07/25/2022   LDLCALC 112 (H) 07/25/2022   LDLCALC 88 06/07/2022    Therapeutic Lab Levels: No results found for: "LITHIUM" No results found for: "VALPROATE" No results found for: "CBMZ"  Physical Findings   GAD-7    [facility] Office Visit from 12/17/2021 in [place]  Total GAD-7 Score 17      PHQ2-9    [place] ED from 06/07/2022 in [facility] Office Visit from 12/17/2021 in [place]  PHQ-2 Total Score 1 2  PHQ-9 Total Score 2 8      Flowsheet Row ED from 10/07/2022 in [facility] Most recent reading at 10/20/2022 11:04 AM ED from 10/07/2022 in [facility] Most recent reading at 10/07/2022 12:18 PM ED from 07/25/2022 in [facility] Most recent reading at 10/06/2022  9:38 AM  C-SSRS RISK CATEGORY No Risk No Risk No Risk        Musculoskeletal  Strength & Muscle Tone: within normal limits Gait & Station: normal Patient leans: N/A  Psychiatric Specialty Exam  Presentation  General Appearance:  Appropriate for Environment; Casual  Eye Contact: Good  Speech: Clear and Coherent; Normal Rate  Speech Volume: Normal  Handedness: Right   Mood and Affect   Mood: Euthymic  Affect: Congruent   Thought Process  Thought Processes: Coherent  Descriptions of Associations:Intact  Orientation:Full (Time, Place and Person)  Thought Content:Logical  Diagnosis of Schizophrenia or Schizoaffective disorder in past: No    Hallucinations:denied  Ideas of Reference:None  Suicidal Thoughts:denied Homicidal Thoughts:denied   Sensorium  Memory: Immediate Good; Recent Good; Remote Good  Judgment: Good  Insight: Good   Executive Functions  Concentration: Good  Attention Span: Good  Recall: Good  Fund of Knowledge: Good  Language: Good   Psychomotor Activity  Psychomotor Activity: normal   Assets  Assets: Communication Skills; Desire for Improvement; Leisure Time; Physical Health; Resilience; Social Support   Sleep  Sleep: good  Physical Exam  Physical Exam Vitals and nursing note reviewed.  Constitutional:      General: She is not in acute distress.    Appearance: She is not ill-appearing or diaphoretic.  HENT:     Head: Normocephalic and atraumatic.  Pulmonary:     Effort: Pulmonary effort is normal. No respiratory distress.  Neurological:     General: No focal deficit present.     Mental Status: She is alert and oriented to person, place, and time.   Blood pressure (!) 115/99, pulse 92, temperature 97.8 F (36.6 C), temperature source Oral, resp. rate 18, height 5\' 1"  (1.549 m), weight 198 lb (89.8 kg), SpO2 99 %. Body mass index is 37.41 kg/m.  Treatment Plan Summary: Plan Patient continues to be psychiatrically cleared, stable and ready for discharge.  Social work/TOC, DSS to continue seeking appropriate placement for patient.  Tentative date: 11/04/2022  Merrily Brittle, DO 10/25/2022 9:04 AM

## 2022-10-25 NOTE — ED Notes (Signed)
Pt resting quietly, breathing is even and unlabored.  Pt denies SI, HI, pain and AVH.  Will continue to monitor for safety.  

## 2022-10-25 NOTE — ED Notes (Signed)
Pt awake and coloring at present, no distress noted, calm & cooperative. Monitoring for safety.

## 2022-10-26 DIAGNOSIS — F419 Anxiety disorder, unspecified: Secondary | ICD-10-CM | POA: Diagnosis not present

## 2022-10-26 DIAGNOSIS — F333 Major depressive disorder, recurrent, severe with psychotic symptoms: Secondary | ICD-10-CM | POA: Diagnosis not present

## 2022-10-26 DIAGNOSIS — F331 Major depressive disorder, recurrent, moderate: Secondary | ICD-10-CM | POA: Diagnosis not present

## 2022-10-26 DIAGNOSIS — F209 Schizophrenia, unspecified: Secondary | ICD-10-CM | POA: Diagnosis not present

## 2022-10-26 DIAGNOSIS — E039 Hypothyroidism, unspecified: Secondary | ICD-10-CM

## 2022-10-26 DIAGNOSIS — N390 Urinary tract infection, site not specified: Secondary | ICD-10-CM | POA: Diagnosis not present

## 2022-10-26 DIAGNOSIS — F17200 Nicotine dependence, unspecified, uncomplicated: Secondary | ICD-10-CM

## 2022-10-26 DIAGNOSIS — N9489 Other specified conditions associated with female genital organs and menstrual cycle: Secondary | ICD-10-CM | POA: Diagnosis not present

## 2022-10-26 DIAGNOSIS — E118 Type 2 diabetes mellitus with unspecified complications: Secondary | ICD-10-CM | POA: Diagnosis not present

## 2022-10-26 DIAGNOSIS — Z1152 Encounter for screening for COVID-19: Secondary | ICD-10-CM | POA: Diagnosis not present

## 2022-10-26 NOTE — ED Notes (Signed)
Patient alert and oriented x 4. Patient denies SI, HI and AVH. Patient observed interacting with others appropriately and now she is watching tv. Respirations equal and unlabored, skin warm and dry, NAD. Routine safety checks conducted according to facility protocol. Will continue to monitor for safety.

## 2022-10-26 NOTE — ED Notes (Signed)
Accepted scheduled meds w/o difficulty. Breakfast provided. Currently sitting on pull out chair/bed in no distress eating cereal. Safety maintained and will continue to monitor.

## 2022-10-26 NOTE — ED Provider Notes (Signed)
Behavioral Health Progress Note  Date and Time: 10/26/2022 10:50 AM Name: Nicole [name] MRN:  [id number]  Subjective: Nicole [name] 32 y.o., female patient with schizophrenia, MDD with psychosis,  presented to [facility], voluntarily, as a walk-in accompanied by [employer] after a disagreement and verbal altercation with personnel at the group home.  She has been dismissed previously from other group homes due to threatening behaviors, and suicidal ideation and the one which she was residing in.  Patient was admitted to continuous assessment unit on 07/25/2022 as she also endorsed passive SI at that time along with symptoms of visual hallucination. Patient was subsequently psychiatrically cleared during her admission to the continuous assessment unit.  However patient remains here at [facility] until adequate- residential placement can be obtained.     Legal Guardian: Mom Nicole [name]) transitioning to be Dad Nicole [name]) Point of contact: Dad Nicole [name])   Tests administered: Wechsler Adult Intelligence Scale-4 (see Nicole Provost, PhD consult note on 08/19/2022 for full info) [place]-3 Adaptive Behavior Scales Assessment completed on 09/13/2022 administered by Nicole Sprinkles, MD  Objective: Patient is seen face-to-face on the unit. Patient receptive, and pleasant upon approach. Alert and oriented. Thought process is clear and coherent. Patient is appropriately groomed and appears to be well nourished. She participated in the assessment with full attention. Patient denies SI/HI/AVH. Vital sign WNL. Patient reports that she slept well (about 9 hours) and her appetite has been good. She reports that she has not heard any voices in a while. Patient is taking her medications as prescribed and has no concerns about. She expressed readiness for discharge and states "I've been here too long, I am ready to go". Plan is to discharge to a group home when placement process complete.    Patient will continue with  her current medication regimen and team will continue to provide support.    Diagnosis:  Final diagnoses:  Tobacco use disorder  Schizophrenia, unspecified type (HCC)  MDD (major depressive disorder), recurrent episode, moderate (HCC)  Hypothyroidism, unspecified type    Total Time spent with patient: 30 minutes  Past Psychiatric History: Schizophrenia, MDD with Psychosis Past Medical History:  Past Medical History:  Diagnosis Date   Anxiety    Depression    Hypothyroidism 08/07/2022   Tobacco use disorder 08/07/2022    Past Surgical History:  Procedure Laterality Date   WISDOM TOOTH EXTRACTION Bilateral 2020   Family History:  Family History  Problem Relation Age of Onset   Hypertension Father    Diabetes Father    Family Psychiatric  History: Not on file Social History:  Social History   Substance and Sexual Activity  Alcohol Use Never     Social History   Substance and Sexual Activity  Drug Use Never    Social History   Socioeconomic History   Marital status: Single    Spouse name: Not on file   Number of children: Not on file   Years of education: Not on file   Highest education level: Not on file  Occupational History   Not on file  Tobacco Use   Smoking status: Every Day    Types: Cigarettes   Smokeless tobacco: Never  Vaping Use   Vaping Use: Some days  Substance and Sexual Activity   Alcohol use: Never   Drug use: Never   Sexual activity: Yes    Partners: Female    Birth control/protection: Implant  Other Topics Concern   Not on file  Social  History Narrative   Not on file   Social Determinants of Health   Financial Resource Strain: Not on file  Food Insecurity: Not on file  Transportation Needs: Not on file  Physical Activity: Not on file  Stress: Not on file  Social Connections: Not on file   SDOH:  SDOH Screenings   Depression (PHQ2-9): Low Risk  (06/10/2022)  Tobacco Use: High Risk (10/21/2022)   Additional Social History:                          Sleep: Good  Appetite:  Good  Current Medications:  Current Facility-Administered Medications  Medication Dose Route Frequency Provider Last Rate Last Admin   acetaminophen (TYLENOL) tablet 650 mg  650 mg Oral Q6H PRN Marlou Sa, NP   650 mg at 10/24/22 2013   alum & mag hydroxide-simeth (MAALOX/MYLANTA) 200-200-20 MG/5ML suspension 30 mL  30 mL Oral Q4H PRN Marlou Sa, NP   30 mL at 10/20/22 2201   ARIPiprazole ER (ABILIFY MAINTENA) 400 MG prefilled syringe 400 mg  400 mg Intramuscular Q28 days Princess Bruins, DO   400 mg at 10/25/22 0920   cetaphil lotion   Topical PRN Princess Bruins, DO       fluticasone [facility]) 50 MCG/ACT nasal spray 1 spray  1 spray Each Nare QHS Princess Bruins, DO   1 spray at 10/25/22 2130   hydrOXYzine (ATARAX) tablet 25 mg  25 mg Oral TID PRN Lorri Frederick, MD   25 mg at 10/25/22 1949   ketoconazole (NIZORAL) 2 % cream   Topical BID Princess Bruins, DO   Given at 10/26/22 1006   levothyroxine (SYNTHROID) tablet 100 mcg  100 mcg Oral Once Aleynah Rocchio M, NP       levothyroxine (SYNTHROID) tablet 100 mcg  100 mcg Oral Q0600 Marlou Sa, NP   100 mcg at 10/26/22 0618   lidocaine (LIDODERM) 5 % 1 patch  1 patch Transdermal Q24H Princess Bruins, DO   1 patch at 10/26/22 1000   loratadine (CLARITIN) tablet 10 mg  10 mg Oral Daily Carrion-Carrero, Margely, MD   10 mg at 10/26/22 1001   magnesium hydroxide (MILK OF MAGNESIA) suspension 30 mL  30 mL Oral Daily PRN Rayburn Go, Likisha Alles M, NP   30 mL at 10/12/22 2145   metFORMIN (GLUCOPHAGE) tablet 500 mg  500 mg Oral Q breakfast Carrion-Carrero, Margely, MD   500 mg at 10/26/22 1001   nicotine (NICODERM CQ - dosed in mg/24 hours) patch 14 mg  14 mg Transdermal Daily PRN Carrion-Carrero, Karle Starch, MD       ondansetron (ZOFRAN-ODT) disintegrating tablet 4 mg  4 mg Oral Q8H PRN Carrion-Carrero, Margely, MD   4 mg at 10/22/22 1806   Oxcarbazepine  (TRILEPTAL) tablet 300 mg  300 mg Oral BID Mykira Hofmeister M, NP   300 mg at 10/26/22 1001   pantoprazole (PROTONIX) EC tablet 40 mg  40 mg Oral Daily Carrion-Carrero, Margely, MD   40 mg at 10/26/22 1002   polyethylene glycol (MIRALAX / GLYCOLAX) packet 17 g  17 g Oral Daily Princess Bruins, DO   17 g at 10/26/22 1000   QUEtiapine (SEROQUEL) tablet 400 mg  400 mg Oral BID Rayburn Go, Suhail Peloquin M, NP   400 mg at 10/26/22 1001   sertraline (ZOLOFT) tablet 150 mg  150 mg Oral Daily Carrion-Carrero, Margely, MD   150 mg at 10/26/22 1001   sodium chloride (OCEAN) 0.65 %  nasal spray 1 spray  1 spray Each Nare Daily Princess BruinsNguyen, Julie, DO   1 spray at 10/26/22 1000   traZODone (DESYREL) tablet 100 mg  100 mg Oral QHS Olin PiaByungura, Etosha Wetherell M, NP   100 mg at 10/25/22 2123   traZODone (DESYREL) tablet 50 mg  50 mg Oral QHS PRN Marlou SaByungura, Thanh Mottern M, NP   50 mg at 10/25/22 2124   valACYclovir (VALTREX) tablet 500 mg  500 mg Oral Daily Carrion-Carrero, Margely, MD   500 mg at 10/26/22 1001   zinc oxide (BALMEX) 11.3 % cream   Topical PRN Princess BruinsNguyen, Julie, DO       Current Outpatient Medications  Medication Sig Dispense Refill   ABILIFY MAINTENA 400 MG PRSY prefilled syringe 400 mg every 28 (twenty-eight) days.     cetirizine (ZYRTEC) 10 MG tablet Take 10 mg by mouth daily.     cyclobenzaprine (FLEXERIL) 10 MG tablet Take 1 tablet (10 mg total) by mouth 2 (two) times daily as needed for muscle spasms. 20 tablet 0   fluticasone (FLONASE) 50 MCG/ACT nasal spray Place 1 spray into both nostrils daily.     meloxicam (MOBIC) 15 MG tablet Take 15 mg by mouth daily.     nitrofurantoin, macrocrystal-monohydrate, (MACROBID) 100 MG capsule Take 1 capsule (100 mg total) by mouth 2 (two) times daily. 10 capsule 0   ondansetron (ZOFRAN-ODT) 4 MG disintegrating tablet Take 1 tablet (4 mg total) by mouth every 8 (eight) hours as needed for nausea or vomiting. 20 tablet 0   Oxcarbazepine (TRILEPTAL) 300 MG tablet Take 1 tablet (300  mg total) by mouth 2 (two) times daily. 60 tablet 0   QUEtiapine (SEROQUEL) 400 MG tablet Take 1 tablet (400 mg total) by mouth 2 (two) times daily. 60 tablet 0   sertraline (ZOLOFT) 50 MG tablet Take 3 tablets (150 mg total) by mouth in the morning. 90 tablet 0   traZODone (DESYREL) 100 MG tablet Take 1 tablet (100 mg total) by mouth at bedtime. 30 tablet 0   valACYclovir (VALTREX) 500 MG tablet Take 500 mg by mouth daily.      Labs  Lab Results:  Admission on 10/07/2022  Component Date Value Ref Range Status   Hgb A1c MFr Bld 10/15/2022 5.7 (H)  4.8 - 5.6 % Final   Comment: (NOTE)         Prediabetes: 5.7 - 6.4         Diabetes: >6.4         Glycemic control for adults with diabetes: <7.0    Mean Plasma Glucose 10/15/2022 117  mg/dL Final   Comment: (NOTE) Performed At: [facility] [address] [place], [place] [id number] Nicole SchimkeNagendra Sanjai MD WU:[phone]   Admission on 10/07/2022, Discharged on 10/07/2022  Component Date Value Ref Range Status   Color, Urine 10/07/2022 YELLOW  YELLOW Final   APPearance 10/07/2022 HAZY (A)  CLEAR Final   Specific Gravity, Urine 10/07/2022 1.011  1.005 - 1.030 Final   pH 10/07/2022 7.0  5.0 - 8.0 Final   Glucose, UA 10/07/2022 NEGATIVE  NEGATIVE mg/dL Final   Hgb urine dipstick 10/07/2022 SMALL (A)  NEGATIVE Final   Bilirubin Urine 10/07/2022 NEGATIVE  NEGATIVE Final   Ketones, ur 10/07/2022 NEGATIVE  NEGATIVE mg/dL Final   Protein, ur 95/62/130812/25/2023 NEGATIVE  NEGATIVE mg/dL Final   Nitrite 65/78/469612/25/2023 NEGATIVE  NEGATIVE Final   Leukocytes,Ua 10/07/2022 LARGE (A)  NEGATIVE Final   RBC / HPF 10/07/2022 0-5  0 - 5 RBC/hpf  Final   WBC, UA 10/07/2022 0-5  0 - 5 WBC/hpf Final   Bacteria, UA 10/07/2022 FEW (A)  NONE SEEN Final   Squamous Epithelial / HPF 10/07/2022 11-20  0 - 5 Final   Performed at [facility], [address] [place], [place] [address]   I-stat hCG, quantitative 10/07/2022 <5.0  <5 mIU/mL Final   Comment 3 10/07/2022           Final   Comment:   GEST. AGE      CONC.  (mIU/mL)   <=1 WEEK        5 - 50     2 WEEKS       50 - 500     3 WEEKS       100 - 10,000     4 WEEKS     1,000 - 30,000        FEMALE AND NON-PREGNANT FEMALE:     LESS THAN 5 mIU/mL    Sodium 10/07/2022 138  135 - 145 mmol/L Final   Potassium 10/07/2022 4.3  3.5 - 5.1 mmol/L Final   Chloride 10/07/2022 104  98 - 111 mmol/L Final   CO2 10/07/2022 28  22 - 32 mmol/L Final   Glucose, Bld 10/07/2022 90  70 - 99 mg/dL Final   Glucose reference range applies only to samples taken after fasting for at least 8 hours.   BUN 10/07/2022 8  6 - 20 mg/dL Final   Creatinine, Ser 10/07/2022 0.96  0.44 - 1.00 mg/dL Final   Calcium 60/45/409812/25/2023 9.1  8.9 - 10.3 mg/dL Final   GFR, Estimated 10/07/2022 >60  >60 mL/min Final   Comment: (NOTE) Calculated using the CKD-EPI Creatinine Equation (2021)    Anion gap 10/07/2022 6  5 - 15 Final   Performed at [facility], [address] [place], [place] [address]   WBC 10/07/2022 5.8  4.0 - 10.5 K/uL Final   RBC 10/07/2022 4.36  3.87 - 5.11 MIL/uL Final   Hemoglobin 10/07/2022 12.9  12.0 - 15.0 g/dL Final   HCT 78/29/562112/25/2023 40.0  36.0 - 46.0 % Final   MCV 10/07/2022 91.7  80.0 - 100.0 fL Final   MCH 10/07/2022 29.6  26.0 - 34.0 pg Final   MCHC 10/07/2022 32.3  30.0 - 36.0 g/dL Final   RDW 30/86/578412/25/2023 12.4  11.5 - 15.5 % Final   Platelets 10/07/2022 295  150 - 400 K/uL Final   nRBC 10/07/2022 0.0  0.0 - 0.2 % Final   Performed at [facility], [address] [place], [place] [address]   SARS Coronavirus 2 by RT PCR 10/07/2022 NEGATIVE  NEGATIVE Final   Comment: (NOTE) SARS-CoV-2 target nucleic acids are NOT DETECTED.  The SARS-CoV-2 RNA is generally detectable in upper respiratory specimens during the acute phase of infection. The lowest concentration of SARS-CoV-2 viral copies this assay can detect is 138 copies/mL. A negative result does not preclude SARS-Cov-2 infection and should not be  used as the sole basis for treatment or other patient management decisions. A negative result may occur with  improper specimen collection/handling, submission of specimen other than nasopharyngeal swab, presence of viral mutation(s) within the areas targeted by this assay, and inadequate number of viral copies(<138 copies/mL). A negative result must be combined with clinical observations, patient history, and epidemiological information. The expected result is Negative.  Fact Sheet for Patients:  BloggerCourse.comhttps://www.fda.gov/media/152166/download  Fact Sheet for Healthcare Providers:  SeriousBroker.ithttps://www.fda.gov/media/152162/download  This test is no                          t yet approved or cleared by the [place] and  has been authorized for detection and/or diagnosis of SARS-CoV-2 by FDA under an Emergency Use Authorization (EUA). This EUA will remain  in effect (meaning this test can be used) for the duration of the COVID-19 declaration under Section 564(b)(1) of the Act, 21 U.S.C.section 360bbb-3(b)(1), unless the authorization is terminated  or revoked sooner.       Influenza A by PCR 10/07/2022 NEGATIVE  NEGATIVE Final   Influenza B by PCR 10/07/2022 NEGATIVE  NEGATIVE Final   Comment: (NOTE) The Xpert Xpress SARS-CoV-2/FLU/RSV plus assay is intended as an aid in the diagnosis of influenza from Nasopharyngeal swab specimens and should not be used as a sole basis for treatment. Nasal washings and aspirates are unacceptable for Xpert Xpress SARS-CoV-2/FLU/RSV testing.  Fact Sheet for Patients: EntrepreneurPulse.com.au  Fact Sheet for Healthcare Providers: IncredibleEmployment.be  This test is not yet approved or cleared by the [place] FDA and has been authorized for detection and/or diagnosis of SARS-CoV-2 by FDA under an Emergency Use Authorization (EUA). This EUA will remain in effect (meaning this test can be used) for the  duration of the COVID-19 declaration under Section 564(b)(1) of the Act, 21 U.S.C. section 360bbb-3(b)(1), unless the authorization is terminated or revoked.     Resp Syncytial Virus by PCR 10/07/2022 NEGATIVE  NEGATIVE Final   Comment: (NOTE) Fact Sheet for Patients: EntrepreneurPulse.com.au  Fact Sheet for Healthcare Providers: IncredibleEmployment.be  This test is not yet approved or cleared by the [place] FDA and has been authorized for detection and/or diagnosis of SARS-CoV-2 by FDA under an Emergency Use Authorization (EUA). This EUA will remain in effect (meaning this test can be used) for the duration of the COVID-19 declaration under Section 564(b)(1) of the Act, 21 U.S.C. section 360bbb-3(b)(1), unless the authorization is terminated or revoked.  Performed at [facility], [place] [address], [place], [place] [address]    Specimen Description 10/07/2022 URINE, CLEAN CATCH   Final   Special Requests 10/07/2022    Final                   Value:NONE Performed at [facility], [place] [address] [place], [place] [address]    Culture 10/07/2022 MULTIPLE SPECIES PRESENT, SUGGEST RECOLLECTION (A)   Final   Report Status 10/07/2022 10/08/2022 FINAL   Final  Admission on 07/25/2022, Discharged on 10/07/2022  Component Date Value Ref Range Status   SARS Coronavirus 2 by RT PCR 07/25/2022 NEGATIVE  NEGATIVE Final   Comment: (NOTE) SARS-CoV-2 target nucleic acids are NOT DETECTED.  The SARS-CoV-2 RNA is generally detectable in upper respiratory specimens during the acute phase of infection. The lowest concentration of SARS-CoV-2 viral copies this assay can detect is 138 copies/mL. A negative result does not preclude SARS-Cov-2 infection and should not be used as the sole basis for treatment or other patient management decisions. A negative result may occur with  improper specimen collection/handling, submission of specimen  other than nasopharyngeal swab, presence of viral mutation(s) within the areas targeted by this assay, and inadequate number of viral copies(<138 copies/mL). A negative result must be combined with clinical observations, patient history, and epidemiological information. The expected result is Negative.  Fact Sheet for Patients:  EntrepreneurPulse.com.au  Fact Sheet for Healthcare Providers:  SeriousBroker.it  This test is no                          t yet approved or cleared by the [place] and  has been authorized for detection and/or diagnosis of SARS-CoV-2 by FDA under an Emergency Use Authorization (EUA). This EUA will remain  in effect (meaning this test can be used) for the duration of the COVID-19 declaration under Section 564(b)(1) of the Act, 21 U.S.C.section 360bbb-3(b)(1), unless the authorization is terminated  or revoked sooner.       Influenza A by PCR 07/25/2022 NEGATIVE  NEGATIVE Final   Influenza B by PCR 07/25/2022 NEGATIVE  NEGATIVE Final   Comment: (NOTE) The Xpert Xpress SARS-CoV-2/FLU/RSV plus assay is intended as an aid in the diagnosis of influenza from Nasopharyngeal swab specimens and should not be used as a sole basis for treatment. Nasal washings and aspirates are unacceptable for Xpert Xpress SARS-CoV-2/FLU/RSV testing.  Fact Sheet for Patients: BloggerCourse.com  Fact Sheet for Healthcare Providers: SeriousBroker.it  This test is not yet approved or cleared by the [place] FDA and has been authorized for detection and/or diagnosis of SARS-CoV-2 by FDA under an Emergency Use Authorization (EUA). This EUA will remain in effect (meaning this test can be used) for the duration of the COVID-19 declaration under Section 564(b)(1) of the Act, 21 U.S.C. section 360bbb-3(b)(1), unless the authorization is terminated or revoked.  Performed at  [facility], [address], [place], [place] [address]    WBC 07/25/2022 8.3  4.0 - 10.5 K/uL Final   RBC 07/25/2022 4.44  3.87 - 5.11 MIL/uL Final   Hemoglobin 07/25/2022 13.7  12.0 - 15.0 g/dL Final   HCT 47/42/5956 40.2  36.0 - 46.0 % Final   MCV 07/25/2022 90.5  80.0 - 100.0 fL Final   MCH 07/25/2022 30.9  26.0 - 34.0 pg Final   MCHC 07/25/2022 34.1  30.0 - 36.0 g/dL Final   RDW 38/75/6433 12.2  11.5 - 15.5 % Final   Platelets 07/25/2022 248  150 - 400 K/uL Final   nRBC 07/25/2022 0.0  0.0 - 0.2 % Final   Neutrophils Relative % 07/25/2022 43  % Final   Neutro Abs 07/25/2022 3.6  1.7 - 7.7 K/uL Final   Lymphocytes Relative 07/25/2022 52  % Final   Lymphs Abs 07/25/2022 4.2 (H)  0.7 - 4.0 K/uL Final   Monocytes Relative 07/25/2022 5  % Final   Monocytes Absolute 07/25/2022 0.4  0.1 - 1.0 K/uL Final   Eosinophils Relative 07/25/2022 0  % Final   Eosinophils Absolute 07/25/2022 0.0  0.0 - 0.5 K/uL Final   Basophils Relative 07/25/2022 0  % Final   Basophils Absolute 07/25/2022 0.0  0.0 - 0.1 K/uL Final   Immature Granulocytes 07/25/2022 0  % Final   Abs Immature Granulocytes 07/25/2022 0.02  0.00 - 0.07 K/uL Final   Performed at [facility], [address] [place], [place] [address]   Sodium 07/25/2022 138  135 - 145 mmol/L Final   Potassium 07/25/2022 4.0  3.5 - 5.1 mmol/L Final   Chloride 07/25/2022 104  98 - 111 mmol/L Final   CO2 07/25/2022 29  22 - 32 mmol/L Final   Glucose, Bld 07/25/2022 83  70 - 99 mg/dL Final   Glucose reference range applies only to samples taken after fasting for at least 8 hours.   BUN 07/25/2022 11  6 - 20 mg/dL Final   Creatinine, Ser 07/25/2022 0.97  0.44 - 1.00 mg/dL Final   Calcium 16/07/9603 9.2  8.9 - 10.3 mg/dL Final   Total Protein 54/06/8118 7.0  6.5 - 8.1 g/dL Final   Albumin 14/78/2956 3.8  3.5 - 5.0 g/dL Final   AST 21/30/8657 18  15 - 41 U/L Final   ALT 07/25/2022 22  0 - 44 U/L Final   Alkaline Phosphatase 07/25/2022  64  38 - 126 U/L Final   Total Bilirubin 07/25/2022 0.2 (L)  0.3 - 1.2 mg/dL Final   GFR, Estimated 07/25/2022 >60  >60 mL/min Final   Comment: (NOTE) Calculated using the CKD-EPI Creatinine Equation (2021)    Anion gap 07/25/2022 5  5 - 15 Final   Performed at [facility], [address] [place] [address], [place], [place] [address]   POC Amphetamine UR 07/25/2022 None Detected  NONE DETECTED (Cut Off Level 1000 ng/mL) Preliminary   POC Secobarbital (BAR) 07/25/2022 None Detected  NONE DETECTED (Cut Off Level 300 ng/mL) Preliminary   POC Buprenorphine (BUP) 07/25/2022 None Detected  NONE DETECTED (Cut Off Level 10 ng/mL) Preliminary   POC Oxazepam (BZO) 07/25/2022 None Detected  NONE DETECTED (Cut Off Level 300 ng/mL) Preliminary   POC Cocaine UR 07/25/2022 None Detected  NONE DETECTED (Cut Off Level 300 ng/mL) Preliminary   POC Methamphetamine UR 07/25/2022 None Detected  NONE DETECTED (Cut Off Level 1000 ng/mL) Preliminary   POC Morphine 07/25/2022 None Detected  NONE DETECTED (Cut Off Level 300 ng/mL) Preliminary   POC Methadone UR 07/25/2022 None Detected  NONE DETECTED (Cut Off Level 300 ng/mL) Preliminary   POC Oxycodone UR 07/25/2022 None Detected  NONE DETECTED (Cut Off Level 100 ng/mL) Preliminary   POC Marijuana UR 07/25/2022 None Detected  NONE DETECTED (Cut Off Level 50 ng/mL) Preliminary   SARSCOV2ONAVIRUS 2 AG 07/25/2022 NEGATIVE  NEGATIVE Final   Comment: (NOTE) SARS-CoV-2 antigen NOT DETECTED.   Negative results are presumptive.  Negative results do not preclude SARS-CoV-2 infection and should not be used as the sole basis for treatment or other patient management decisions, including infection  control decisions, particularly in the presence of clinical signs and  symptoms consistent with COVID-19, or in those who have been in contact with the virus.  Negative results must be combined with clinical observations, patient history, and epidemiological information. The expected result  is Negative.  Fact Sheet for Patients: https://www.jennings-kim.com/  Fact Sheet for Healthcare Providers: https://alexander-rogers.biz/  This test is not yet approved or cleared by the [place] FDA and  has been authorized for detection and/or diagnosis of SARS-CoV-2 by FDA under an Emergency Use Authorization (EUA).  This EUA will remain in effect (meaning this test can be used) for the duration of  the COV                          ID-19 declaration under Section 564(b)(1) of the Act, 21 U.S.C. section 360bbb-3(b)(1), unless the authorization is terminated or revoked sooner.     Cholesterol 07/25/2022 181  0 - 200 mg/dL Final   Triglycerides 29/52/8413 118  <150 mg/dL Final   HDL 24/40/1027 45  >40 mg/dL Final   Total CHOL/HDL Ratio 07/25/2022 4.0  RATIO Final   VLDL 07/25/2022 24  0 - 40 mg/dL Final   LDL Cholesterol 07/25/2022 112 (H)  0 - 99 mg/dL Final   Comment:        Total  Cholesterol/HDL:CHD Risk Coronary Heart Disease Risk Table                     Men   Women  1/2 Average Risk   3.4   3.3  Average Risk       5.0   4.4  2 X Average Risk   9.6   7.1  3 X Average Risk  23.4   11.0        Use the calculated Patient Ratio above and the CHD Risk Table to determine the patient's CHD Risk.        ATP III CLASSIFICATION (LDL):  <100     mg/dL   Optimal  161-096  mg/dL   Near or Above                    Optimal  130-159  mg/dL   Borderline  045-409  mg/dL   High  >811     mg/dL   Very High Performed at [facility], [address], [place], [place] [address]    TSH 07/25/2022 6.668 (H)  0.350 - 4.500 uIU/mL Final   Comment: Performed by a 3rd Generation assay with a functional sensitivity of <=0.01 uIU/mL. Performed at [facility], [address] [place], [place] [address]    Glucose-Capillary 07/26/2022 104 (H)  70 - 99 mg/dL Final   Glucose reference range applies only to samples taken after fasting for at least 8 hours.    T3, Free 07/31/2022 2.3  2.0 - 4.4 pg/mL Final   Comment: (NOTE) Performed At: [facility] [address] [place], [place] [id number] Nicole Schimke MD ON:[id number]    Free T4 07/31/2022 0.60 (L)  0.61 - 1.12 ng/dL Final   Comment: (NOTE) Biotin ingestion may interfere with free T4 tests. If the results are inconsistent with the TSH level, previous test results, or the clinical presentation, then consider biotin interference. If needed, order repeat testing after stopping biotin. Performed at [facility], [address] [place], [place] [address]    Glucose-Capillary 08/29/2022 100 (H)  70 - 99 mg/dL Final   Glucose reference range applies only to samples taken after fasting for at least 8 hours.   TSH 09/05/2022 0.793  0.350 - 4.500 uIU/mL Final   Comment: Performed by a 3rd Generation assay with a functional sensitivity of <=0.01 uIU/mL. Performed at [facility], [address], [place], [place] [address]    Free T4 09/05/2022 0.73  0.61 - 1.12 ng/dL Final   Comment: (NOTE) Biotin ingestion may interfere with free T4 tests. If the results are inconsistent with the TSH level, previous test results, or the clinical presentation, then consider biotin interference. If needed, order repeat testing after stopping biotin. Performed at [facility], [address], [place], [place] [address]    Preg Test, Ur 09/06/2022 Negative  Negative Final   Preg Test, Ur 09/06/2022 NEGATIVE  NEGATIVE Final   Comment:        THE SENSITIVITY OF THIS METHODOLOGY IS >24 mIU/mL    Preg Test, Ur 09/05/2022 NEGATIVE  NEGATIVE Final   Comment:        THE SENSITIVITY OF THIS METHODOLOGY IS >24 mIU/mL    Sodium 09/13/2022 136  135 - 145 mmol/L Final   Potassium 09/13/2022 4.5  3.5 - 5.1 mmol/L Final   Chloride 09/13/2022 105  98 - 111 mmol/L Final   CO2 09/13/2022  21 (L)  22 - 32 mmol/L Final   Glucose, Bld 09/13/2022 111 (H)  70 - 99 mg/dL Final   Glucose  reference range applies only to samples taken after fasting for at least 8 hours.   BUN 09/13/2022 14  6 - 20 mg/dL Final   Creatinine, Ser 09/13/2022 0.92  0.44 - 1.00 mg/dL Final   Calcium 16/07/9603 9.1  8.9 - 10.3 mg/dL Final   GFR, Estimated 09/13/2022 >60  >60 mL/min Final   Comment: (NOTE) Calculated using the CKD-EPI Creatinine Equation (2021)    Anion gap 09/13/2022 10  5 - 15 Final   Performed at [facility], [address], [place], [place] [address]   Vitamin B-12 09/13/2022 585  180 - 914 pg/mL Final   Comment: (NOTE) This assay is not validated for testing neonatal or myeloproliferative syndrome specimens for Vitamin B12 levels. Performed at [facility], [address] [place], [place] [address]    Color, Urine 09/14/2022 YELLOW  YELLOW Final   APPearance 09/14/2022 HAZY (A)  CLEAR Final   Specific Gravity, Urine 09/14/2022 1.025  1.005 - 1.030 Final   pH 09/14/2022 5.0  5.0 - 8.0 Final   Glucose, UA 09/14/2022 NEGATIVE  NEGATIVE mg/dL Final   Hgb urine dipstick 09/14/2022 NEGATIVE  NEGATIVE Final   Bilirubin Urine 09/14/2022 NEGATIVE  NEGATIVE Final   Ketones, ur 09/14/2022 NEGATIVE  NEGATIVE mg/dL Final   Protein, ur 78/29/5621 NEGATIVE  NEGATIVE mg/dL Final   Nitrite 30/86/5784 NEGATIVE  NEGATIVE Final   Leukocytes,Ua 09/14/2022 TRACE (A)  NEGATIVE Final   RBC / HPF 09/14/2022 0-5  0 - 5 RBC/hpf Final   WBC, UA 09/14/2022 0-5  0 - 5 WBC/hpf Final   Bacteria, UA 09/14/2022 RARE (A)  NONE SEEN Final   Squamous Epithelial / HPF 09/14/2022 0-5  0 - 5 Final   Mucus 09/14/2022 PRESENT   Final   Performed at [facility], [address], [place], [place] [address]   Glucose-Capillary 09/16/2022 105 (H)  70 - 99 mg/dL Final   Glucose reference range applies only to samples taken after fasting for at least 8 hours.   SARS Coronavirus 2 by RT PCR 09/30/2022 NEGATIVE  NEGATIVE Final   Comment: (NOTE) SARS-CoV-2 target nucleic acids are NOT  DETECTED.  The SARS-CoV-2 RNA is generally detectable in upper respiratory specimens during the acute phase of infection. The lowest concentration of SARS-CoV-2 viral copies this assay can detect is 138 copies/mL. A negative result does not preclude SARS-Cov-2 infection and should not be used as the sole basis for treatment or other patient management decisions. A negative result may occur with  improper specimen collection/handling, submission of specimen other than nasopharyngeal swab, presence of viral mutation(s) within the areas targeted by this assay, and inadequate number of viral copies(<138 copies/mL). A negative result must be combined with clinical observations, patient history, and epidemiological information. The expected result is Negative.  Fact Sheet for Patients:  BloggerCourse.com  Fact Sheet for Healthcare Providers:  SeriousBroker.it  This test is no                          t yet approved or cleared by the [place] FDA and  has been authorized for detection and/or diagnosis of SARS-CoV-2 by FDA under an Emergency Use Authorization (EUA). This EUA will remain  in effect (meaning this test can be used) for the duration of the  COVID-19 declaration under Section 564(b)(1) of the Act, 21 U.S.C.section 360bbb-3(b)(1), unless the authorization is terminated  or revoked sooner.       Influenza A by PCR 09/30/2022 NEGATIVE  NEGATIVE Final   Influenza B by PCR 09/30/2022 NEGATIVE  NEGATIVE Final   Comment: (NOTE) The Xpert Xpress SARS-CoV-2/FLU/RSV plus assay is intended as an aid in the diagnosis of influenza from Nasopharyngeal swab specimens and should not be used as a sole basis for treatment. Nasal washings and aspirates are unacceptable for Xpert Xpress SARS-CoV-2/FLU/RSV testing.  Fact Sheet for Patients: BloggerCourse.com  Fact Sheet for Healthcare  Providers: SeriousBroker.it  This test is not yet approved or cleared by the [place] FDA and has been authorized for detection and/or diagnosis of SARS-CoV-2 by FDA under an Emergency Use Authorization (EUA). This EUA will remain in effect (meaning this test can be used) for the duration of the COVID-19 declaration under Section 564(b)(1) of the Act, 21 U.S.C. section 360bbb-3(b)(1), unless the authorization is terminated or revoked.     Resp Syncytial Virus by PCR 09/30/2022 NEGATIVE  NEGATIVE Final   Comment: (NOTE) Fact Sheet for Patients: BloggerCourse.com  Fact Sheet for Healthcare Providers: SeriousBroker.it  This test is not yet approved or cleared by the [place] FDA and has been authorized for detection and/or diagnosis of SARS-CoV-2 by FDA under an Emergency Use Authorization (EUA). This EUA will remain in effect (meaning this test can be used) for the duration of the COVID-19 declaration under Section 564(b)(1) of the Act, 21 U.S.C. section 360bbb-3(b)(1), unless the authorization is terminated or revoked.  Performed at [facility], [address] [place], [place] [address]    Sodium 10/01/2022 136  135 - 145 mmol/L Final   Potassium 10/01/2022 3.8  3.5 - 5.1 mmol/L Final   Chloride 10/01/2022 104  98 - 111 mmol/L Final   CO2 10/01/2022 27  22 - 32 mmol/L Final   Glucose, Bld 10/01/2022 98  70 - 99 mg/dL Final   Glucose reference range applies only to samples taken after fasting for at least 8 hours.   BUN 10/01/2022 12  6 - 20 mg/dL Final   Creatinine, Ser 10/01/2022 0.98  0.44 - 1.00 mg/dL Final   Calcium 60/45/4098 8.9  8.9 - 10.3 mg/dL Final   GFR, Estimated 10/01/2022 >60  >60 mL/min Final   Comment: (NOTE) Calculated using the CKD-EPI Creatinine Equation (2021)    Anion gap 10/01/2022 5  5 - 15 Final   Performed at [facility], [address]  [place], [place] [address]   WBC 10/01/2022 5.1  4.0 - 10.5 K/uL Final   RBC 10/01/2022 4.31  3.87 - 5.11 MIL/uL Final   Hemoglobin 10/01/2022 12.7  12.0 - 15.0 g/dL Final   HCT 78/29/5621 38.8  36.0 - 46.0 % Final   MCV 10/01/2022 90.0  80.0 - 100.0 fL Final   MCH 10/01/2022 29.5  26.0 - 34.0 pg Final   MCHC 10/01/2022 32.7  30.0 - 36.0 g/dL Final   RDW 30/86/5784 12.4  11.5 - 15.5 % Final   Platelets 10/01/2022 300  150 - 400 K/uL Final   nRBC 10/01/2022 0.0  0.0 - 0.2 % Final   Performed at [facility], [address], [place], [place] [address]   Lipase 10/01/2022 29  11 - 51 U/L Final   Performed at [facility], [address] [place], [place] [address]   Color, Urine 10/05/2022 YELLOW  YELLOW Final   APPearance 10/05/2022 HAZY (A)  CLEAR Final   Specific Gravity, Urine 10/05/2022 1.018  1.005 - 1.030 Final   pH 10/05/2022 5.0  5.0 - 8.0 Final   Glucose, UA 10/05/2022 NEGATIVE  NEGATIVE mg/dL Final   Hgb urine dipstick 10/05/2022 NEGATIVE  NEGATIVE Final   Bilirubin Urine 10/05/2022 NEGATIVE  NEGATIVE Final   Ketones, ur 10/05/2022 NEGATIVE  NEGATIVE mg/dL Final   Protein, ur 40/98/119112/23/2023 NEGATIVE  NEGATIVE mg/dL Final   Nitrite 47/82/956212/23/2023 NEGATIVE  NEGATIVE Final   Leukocytes,Ua 10/05/2022 TRACE (A)  NEGATIVE Final   RBC / HPF 10/05/2022 0-5  0 - 5 RBC/hpf Final   WBC, UA 10/05/2022 0-5  0 - 5 WBC/hpf Final   Bacteria, UA 10/05/2022 RARE (A)  NONE SEEN Final   Squamous Epithelial / HPF 10/05/2022 0-5  0 - 5 Final   Mucus 10/05/2022 PRESENT   Final   Performed at [facility], [address] [place], [place] [address]   Specimen Description 10/05/2022 URINE, CLEAN CATCH   Final   Special Requests 10/05/2022    Final                   Value:NONE Performed at [facility], [address] [place], [place] [address]    Culture 10/05/2022 10,000 COLONIES/mL MULTIPLE SPECIES PRESENT, SUGGEST RECOLLECTION (A)   Final   Report Status 10/05/2022 10/07/2022 FINAL    Final  Admission on 07/04/2022, Discharged on 07/05/2022  Component Date Value Ref Range Status   Sodium 07/04/2022 142  135 - 145 mmol/L Final   Potassium 07/04/2022 4.4  3.5 - 5.1 mmol/L Final   Chloride 07/04/2022 109  98 - 111 mmol/L Final   CO2 07/04/2022 26  22 - 32 mmol/L Final   Glucose, Bld 07/04/2022 96  70 - 99 mg/dL Final   Glucose reference range applies only to samples taken after fasting for at least 8 hours.   BUN 07/04/2022 15  6 - 20 mg/dL Final   Creatinine, Ser 07/04/2022 0.83  0.44 - 1.00 mg/dL Final   Calcium 96/29/528409/21/2023 9.3  8.9 - 10.3 mg/dL Final   Total Protein 13/24/401009/21/2023 7.2  6.5 - 8.1 g/dL Final   Albumin 27/25/366409/21/2023 3.9  3.5 - 5.0 g/dL Final   AST 40/34/742509/21/2023 23  15 - 41 U/L Final   ALT 07/04/2022 28  0 - 44 U/L Final   Alkaline Phosphatase 07/04/2022 77  38 - 126 U/L Final   Total Bilirubin 07/04/2022 0.4  0.3 - 1.2 mg/dL Final   GFR, Estimated 07/04/2022 >60  >60 mL/min Final   Comment: (NOTE) Calculated using the CKD-EPI Creatinine Equation (2021)    Anion gap 07/04/2022 7  5 - 15 Final   Performed at [facility], [address] [place], [place] [address]   Alcohol, Ethyl (B) 07/04/2022 <10  <10 mg/dL Final   Comment: (NOTE) Lowest detectable limit for serum alcohol is 10 mg/dL.  For medical purposes only. Performed at [facility], [address] [place], [place] [address]    WBC 07/04/2022 7.0  4.0 - 10.5 K/uL Final   RBC 07/04/2022 4.18  3.87 - 5.11 MIL/uL Final   Hemoglobin 07/04/2022 12.8  12.0 - 15.0 g/dL Final   HCT 32/95/188409/21/2023 39.1  36.0 - 46.0 % Final   MCV 07/04/2022 93.5  80.0 - 100.0 fL Final   MCH 07/04/2022 30.6  26.0 - 34.0 pg Final   MCHC  07/04/2022 32.7  30.0 - 36.0 g/dL Final   RDW 02/58/5277 12.9  11.5 - 15.5 % Final   Platelets 07/04/2022 243  150 - 400 K/uL Final   nRBC 07/04/2022 0.0  0.0 - 0.2 % Final   Neutrophils Relative % 07/04/2022 43  % Final   Neutro Abs 07/04/2022 3.0  1.7 -  7.7 K/uL Final   Lymphocytes Relative 07/04/2022 50  % Final   Lymphs Abs 07/04/2022 3.5  0.7 - 4.0 K/uL Final   Monocytes Relative 07/04/2022 7  % Final   Monocytes Absolute 07/04/2022 0.5  0.1 - 1.0 K/uL Final   Eosinophils Relative 07/04/2022 0  % Final   Eosinophils Absolute 07/04/2022 0.0  0.0 - 0.5 K/uL Final   Basophils Relative 07/04/2022 0  % Final   Basophils Absolute 07/04/2022 0.0  0.0 - 0.1 K/uL Final   Immature Granulocytes 07/04/2022 0  % Final   Abs Immature Granulocytes 07/04/2022 0.01  0.00 - 0.07 K/uL Final   Performed at [facility], [address] [place], [place] [address]   I-stat hCG, quantitative 07/04/2022 <5.0  <5 mIU/mL Final   Comment 3 07/04/2022          Final   Comment:   GEST. AGE      CONC.  (mIU/mL)   <=1 WEEK        5 - 50     2 WEEKS       50 - 500     3 WEEKS       100 - 10,000     4 WEEKS     1,000 - 30,000        FEMALE AND NON-PREGNANT FEMALE:     LESS THAN 5 mIU/mL   Admission on 06/14/2022, Discharged on 06/14/2022  Component Date Value Ref Range Status   Color, UA 06/14/2022 yellow  yellow Final   Clarity, UA 06/14/2022 cloudy (A)  clear Final   Glucose, UA 06/14/2022 negative  negative mg/dL Final   Bilirubin, UA 53/61/4431 negative  negative Final   Ketones, POC UA 06/14/2022 negative  negative mg/dL Final   Spec Grav, UA 54/00/8676 1.025  1.010 - 1.025 Final   Blood, UA 06/14/2022 negative  negative Final   pH, UA 06/14/2022 7.5  5.0 - 8.0 Final   Protein Ur, POC 06/14/2022 =30 (A)  negative mg/dL Final   Urobilinogen, UA 06/14/2022 0.2  0.2 or 1.0 E.U./dL Final   Nitrite, UA 19/50/9326 Negative  Negative Final   Leukocytes, UA 06/14/2022 Negative  Negative Final   Preg Test, Ur 06/14/2022 Negative  Negative Final   Specimen Description 06/14/2022 URINE, CLEAN CATCH   Final   Special Requests 06/14/2022    Final                   Value:NONE Performed at [facility], [address], [place], [place]  [address]    Culture 06/14/2022 MULTIPLE SPECIES PRESENT, SUGGEST RECOLLECTION (A)   Final   Report Status 06/14/2022 06/16/2022 FINAL   Final  Admission on 06/07/2022, Discharged on 06/10/2022  Component Date Value Ref Range Status   SARS Coronavirus 2 by RT PCR 06/07/2022 NEGATIVE  NEGATIVE Final   Comment: (NOTE) SARS-CoV-2 target nucleic acids are NOT DETECTED.  The SARS-CoV-2 RNA is generally detectable in upper respiratory specimens during the acute phase of infection. The lowest concentration of SARS-CoV-2 viral copies this assay can detect is 138 copies/mL. A negative result does  not preclude SARS-Cov-2 infection and should not be used as the sole basis for treatment or other patient management decisions. A negative result may occur with  improper specimen collection/handling, submission of specimen other than nasopharyngeal swab, presence of viral mutation(s) within the areas targeted by this assay, and inadequate number of viral copies(<138 copies/mL). A negative result must be combined with clinical observations, patient history, and epidemiological information. The expected result is Negative.  Fact Sheet for Patients:  BloggerCourse.com  Fact Sheet for Healthcare Providers:  SeriousBroker.it  This test is no                          t yet approved or cleared by the [place] FDA and  has been authorized for detection and/or diagnosis of SARS-CoV-2 by FDA under an Emergency Use Authorization (EUA). This EUA will remain  in effect (meaning this test can be used) for the duration of the COVID-19 declaration under Section 564(b)(1) of the Act, 21 U.S.C.section 360bbb-3(b)(1), unless the authorization is terminated  or revoked sooner.       Influenza A by PCR 06/07/2022 NEGATIVE  NEGATIVE Final   Influenza B by PCR 06/07/2022 NEGATIVE  NEGATIVE Final   Comment: (NOTE) The Xpert Xpress SARS-CoV-2/FLU/RSV plus assay is  intended as an aid in the diagnosis of influenza from Nasopharyngeal swab specimens and should not be used as a sole basis for treatment. Nasal washings and aspirates are unacceptable for Xpert Xpress SARS-CoV-2/FLU/RSV testing.  Fact Sheet for Patients: BloggerCourse.com  Fact Sheet for Healthcare Providers: SeriousBroker.it  This test is not yet approved or cleared by the [place] FDA and has been authorized for detection and/or diagnosis of SARS-CoV-2 by FDA under an Emergency Use Authorization (EUA). This EUA will remain in effect (meaning this test can be used) for the duration of the COVID-19 declaration under Section 564(b)(1) of the Act, 21 U.S.C. section 360bbb-3(b)(1), unless the authorization is terminated or revoked.  Performed at [facility], [address] [place], [place] [address]    WBC 06/07/2022 5.7  4.0 - 10.5 K/uL Final   RBC 06/07/2022 4.39  3.87 - 5.11 MIL/uL Final   Hemoglobin 06/07/2022 13.2  12.0 - 15.0 g/dL Final   HCT 82/95/6213 40.4  36.0 - 46.0 % Final   MCV 06/07/2022 92.0  80.0 - 100.0 fL Final   MCH 06/07/2022 30.1  26.0 - 34.0 pg Final   MCHC 06/07/2022 32.7  30.0 - 36.0 g/dL Final   RDW 08/65/7846 12.4  11.5 - 15.5 % Final   Platelets 06/07/2022 308  150 - 400 K/uL Final   nRBC 06/07/2022 0.0  0.0 - 0.2 % Final   Neutrophils Relative % 06/07/2022 42  % Final   Neutro Abs 06/07/2022 2.4  1.7 - 7.7 K/uL Final   Lymphocytes Relative 06/07/2022 54  % Final   Lymphs Abs 06/07/2022 3.1  0.7 - 4.0 K/uL Final   Monocytes Relative 06/07/2022 4  % Final   Monocytes Absolute 06/07/2022 0.2  0.1 - 1.0 K/uL Final   Eosinophils Relative 06/07/2022 0  % Final   Eosinophils Absolute 06/07/2022 0.0  0.0 - 0.5 K/uL Final   Basophils Relative 06/07/2022 0  % Final   Basophils Absolute 06/07/2022 0.0  0.0 - 0.1 K/uL Final   Immature Granulocytes 06/07/2022 0  % Final   Abs Immature Granulocytes  06/07/2022 0.01  0.00 - 0.07 K/uL Final   Performed at [facility], [address] [place], [place] [address]   Sodium 06/07/2022 139  135 - 145 mmol/L Final   Potassium 06/07/2022 4.0  3.5 - 5.1 mmol/L Final   Chloride 06/07/2022 104  98 - 111 mmol/L Final   CO2 06/07/2022 28  22 - 32 mmol/L Final   Glucose, Bld 06/07/2022 104 (H)  70 - 99 mg/dL Final   Glucose reference range applies only to samples taken after fasting for at least 8 hours.   BUN 06/07/2022 8  6 - 20 mg/dL Final   Creatinine, Ser 06/07/2022 0.84  0.44 - 1.00 mg/dL Final   Calcium 82/95/6213 9.1  8.9 - 10.3 mg/dL Final   Total Protein 08/65/7846 6.9  6.5 - 8.1 g/dL Final   Albumin 96/29/5284 3.7  3.5 - 5.0 g/dL Final   AST 13/24/4010 19  15 - 41 U/L Final   ALT 06/07/2022 22  0 - 44 U/L Final   Alkaline Phosphatase 06/07/2022 54  38 - 126 U/L Final   Total Bilirubin 06/07/2022 0.4  0.3 - 1.2 mg/dL Final   GFR, Estimated 06/07/2022 >60  >60 mL/min Final   Comment: (NOTE) Calculated using the CKD-EPI Creatinine Equation (2021)    Anion gap 06/07/2022 7  5 - 15 Final   Performed at [facility], [address] [place], [place] [address]   Hgb A1c MFr Bld 06/07/2022 5.0  4.8 - 5.6 % Final   Comment: (NOTE) Pre diabetes:          5.7%-6.4%  Diabetes:              >6.4%  Glycemic control for   <7.0% adults with diabetes    Mean Plasma Glucose 06/07/2022 96.8  mg/dL Final   Performed at [facility], [address], [place], [place] [address]   TSH 06/07/2022 1.620  0.350 - 4.500 uIU/mL Final   Comment: Performed by a 3rd Generation assay with a functional sensitivity of <=0.01 uIU/mL. Performed at [facility], [address] [place], [place] [address]    RPR Ser Ql 06/07/2022 NON REACTIVE  NON REACTIVE Final   Performed at [facility], [address], [place], [place] [address]   Color, Urine 06/07/2022 YELLOW  YELLOW Final   APPearance 06/07/2022 HAZY (A)  CLEAR Final    Specific Gravity, Urine 06/07/2022 1.018  1.005 - 1.030 Final   pH 06/07/2022 7.0  5.0 - 8.0 Final   Glucose, UA 06/07/2022 NEGATIVE  NEGATIVE mg/dL Final   Hgb urine dipstick 06/07/2022 NEGATIVE  NEGATIVE Final   Bilirubin Urine 06/07/2022 NEGATIVE  NEGATIVE Final   Ketones, ur 06/07/2022 NEGATIVE  NEGATIVE mg/dL Final   Protein, ur 87/56/4332 NEGATIVE  NEGATIVE mg/dL Final   Nitrite 95/18/8416 NEGATIVE  NEGATIVE Final   Leukocytes,Ua 06/07/2022 NEGATIVE  NEGATIVE Final   Performed at [facility], [address] [place], [place] [address]   Cholesterol 06/07/2022 164  0 - 200 mg/dL Final   Triglycerides 16/10/930 158 (H)  <150 mg/dL Final   HDL 35/57/3220 44  >40 mg/dL Final   Total CHOL/HDL Ratio 06/07/2022 3.7  RATIO Final   VLDL 06/07/2022 32  0 - 40 mg/dL Final   LDL Cholesterol 06/07/2022 88  0 - 99 mg/dL Final   Comment:        Total Cholesterol/HDL:CHD Risk Coronary Heart Disease Risk Table  Men   Women  1/2 Average Risk   3.4   3.3  Average Risk       5.0   4.4  2 X Average Risk   9.6   7.1  3 X Average Risk  23.4   11.0        Use the calculated Patient Ratio above and the CHD Risk Table to determine the patient's CHD Risk.        ATP III CLASSIFICATION (LDL):  <100     mg/dL   Optimal  102-585  mg/dL   Near or Above                    Optimal  130-159  mg/dL   Borderline  277-824  mg/dL   High  >235     mg/dL   Very High Performed at [facility], [address] [place], [place] [address]    HIV Screen 4th Generation wRfx 06/07/2022 Non Reactive  Non Reactive Final   Performed at [facility], [address] [place], [place] [address]   SARSCOV2ONAVIRUS 2 AG 06/07/2022 NEGATIVE  NEGATIVE Final   Comment: (NOTE) SARS-CoV-2 antigen NOT DETECTED.   Negative results are presumptive.  Negative results do not preclude SARS-CoV-2 infection and should not be used as the sole basis for treatment or other patient management  decisions, including infection  control decisions, particularly in the presence of clinical signs and  symptoms consistent with COVID-19, or in those who have been in contact with the virus.  Negative results must be combined with clinical observations, patient history, and epidemiological information. The expected result is Negative.  Fact Sheet for Patients: https://www.jennings-kim.com/  Fact Sheet for Healthcare Providers: https://alexander-rogers.biz/  This test is not yet approved or cleared by the [place] FDA and  has been authorized for detection and/or diagnosis of SARS-CoV-2 by FDA under an Emergency Use Authorization (EUA).  This EUA will remain in effect (meaning this test can be used) for the duration of  the COV                          ID-19 declaration under Section 564(b)(1) of the Act, 21 U.S.C. section 360bbb-3(b)(1), unless the authorization is terminated or revoked sooner.     POC Amphetamine UR 06/07/2022 None Detected  NONE DETECTED (Cut Off Level 1000 ng/mL) Final   POC Secobarbital (BAR) 06/07/2022 None Detected  NONE DETECTED (Cut Off Level 300 ng/mL) Final   POC Buprenorphine (BUP) 06/07/2022 None Detected  NONE DETECTED (Cut Off Level 10 ng/mL) Final   POC Oxazepam (BZO) 06/07/2022 None Detected  NONE DETECTED (Cut Off Level 300 ng/mL) Final   POC Cocaine UR 06/07/2022 None Detected  NONE DETECTED (Cut Off Level 300 ng/mL) Final   POC Methamphetamine UR 06/07/2022 None Detected  NONE DETECTED (Cut Off Level 1000 ng/mL) Final   POC Morphine 06/07/2022 None Detected  NONE DETECTED (Cut Off Level 300 ng/mL) Final   POC Methadone UR 06/07/2022 None Detected  NONE DETECTED (Cut Off Level 300 ng/mL) Final   POC Oxycodone UR 06/07/2022 None Detected  NONE DETECTED (Cut Off Level 100 ng/mL) Final   POC Marijuana UR 06/07/2022 None Detected  NONE DETECTED (Cut Off Level 50 ng/mL) Final   Preg Test, Ur 06/08/2022 NEGATIVE  NEGATIVE Final    Comment:        THE SENSITIVITY OF THIS METHODOLOGY IS >20 mIU/mL. Performed at  [facility], [place] [address], [place], [place] [address]    Preg Test, Ur 06/08/2022 NEGATIVE  NEGATIVE Final   Comment:        THE SENSITIVITY OF THIS METHODOLOGY IS >24 mIU/mL     Blood Alcohol level:  Lab Results  Component Value Date   ETH <10 07/04/2022   ETH <10 81/27/5170    Metabolic Disorder Labs: Lab Results  Component Value Date   HGBA1C 5.7 (H) 10/15/2022   MPG 117 10/15/2022   MPG 96.8 06/07/2022   No results found for: "PROLACTIN" Lab Results  Component Value Date   CHOL 181 07/25/2022   TRIG 118 07/25/2022   HDL 45 07/25/2022   CHOLHDL 4.0 07/25/2022   VLDL 24 07/25/2022   LDLCALC 112 (H) 07/25/2022   LDLCALC 88 06/07/2022    Therapeutic Lab Levels: No results found for: "LITHIUM" No results found for: "VALPROATE" No results found for: "CBMZ"  Physical Findings   GAD-7    [place] Office Visit from 12/17/2021 in [place]  Total GAD-7 Score 17      PHQ2-9    [place] [facility] from 10/07/2022 in [facility] ED from 06/07/2022 in [facility] Office Visit from 12/17/2021 in [place]  PHQ-2 Total Score 0 1 2  PHQ-9 Total Score -- 2 8      Flowsheet Row ED from 10/07/2022 in [facility] Most recent reading at 10/20/2022 11:04 AM ED from 10/07/2022 in [place] Most recent reading at 10/07/2022 12:18 PM ED from 07/25/2022 in [facility] Most recent reading at 10/06/2022  9:38 AM  C-SSRS RISK CATEGORY No Risk No Risk No Risk        Musculoskeletal  Strength & Muscle Tone: within normal limits Gait & Station: normal Patient leans: N/A  Psychiatric Specialty Exam  Presentation  General Appearance:  Appropriate for Environment  Eye  Contact: Good  Speech: Clear and Coherent  Speech Volume: Normal  Handedness: Right   Mood and Affect  Mood: Euthymic  Affect: Congruent   Thought Process  Thought Processes: Coherent  Descriptions of Associations:Intact  Orientation:Full (Time, Place and Person)  Thought Content:Logical  Diagnosis of Schizophrenia or Schizoaffective disorder in past: No    Hallucinations:Hallucinations: None  Ideas of Reference:None  Suicidal Thoughts:Suicidal Thoughts: No  Homicidal Thoughts:Homicidal Thoughts: No   Sensorium  Memory: Immediate Good; Recent Good; Remote Good  Judgment: Fair  Insight: Fair   Community education officer  Concentration: Good  Attention Span: Good  Recall: Good  Fund of Knowledge: Good  Language: Good   Psychomotor Activity  Psychomotor Activity: Psychomotor Activity: Normal   Assets  Assets: Communication Skills; Desire for Improvement; Physical Health   Sleep  Sleep: Sleep: Good Number of Hours of Sleep: 9   Nutritional Assessment (For [facility]BS and [facility] admissions only) Has the patient had a weight loss or gain of 10 pounds or more in the last 3 months?: No Has the patient had a decrease in food intake/or appetite?: No Does the patient have dental problems?: No Does the patient have eating habits or behaviors that may be indicators of an eating disorder including binging or inducing vomiting?: No Has the patient recently lost weight without trying?: 0 Has the patient been eating poorly because of a decreased appetite?: 0 Malnutrition Screening Tool Score: 0    Physical Exam  Physical Exam Vitals and nursing note reviewed.  Constitutional:      Appearance: Normal appearance.  HENT:     Head: Normocephalic and atraumatic.     Right Ear: Tympanic membrane normal.     Left Ear: Tympanic membrane normal.     Nose: Nose normal.     Mouth/Throat:     Mouth: Mucous membranes are moist.  Eyes:     Extraocular  Movements: Extraocular movements intact.     Conjunctiva/sclera: Conjunctivae normal.     Pupils: Pupils are equal, round, and reactive to light.  Cardiovascular:     Rate and Rhythm: Normal rate and regular rhythm.  Pulmonary:     Effort: Pulmonary effort is normal.  Abdominal:     General: Bowel sounds are normal.  Genitourinary:    General: Normal vulva.     Rectum: Normal.  Musculoskeletal:        General: Normal range of motion.     Cervical back: Normal range of motion.  Skin:    General: Skin is warm.  Neurological:     General: No focal deficit present.     Mental Status: She is alert and oriented to person, place, and time.  Psychiatric:        Mood and Affect: Mood normal.        Behavior: Behavior normal.        Thought Content: Thought content normal.    Review of Systems  Constitutional: Negative.   HENT: Negative.    Eyes: Negative.   Respiratory: Negative.    Cardiovascular: Negative.   Gastrointestinal: Negative.   Genitourinary: Negative.   Musculoskeletal: Negative.   Skin: Negative.   Neurological: Negative.   Endo/Heme/Allergies: Negative.   Psychiatric/Behavioral: Negative.     Blood pressure 111/81, pulse 96, temperature 98.1 F (36.7 C), temperature source Oral, resp. rate 18, height 5\' 1"  (1.549 m), weight 198 lb (89.8 kg), SpO2 94 %. Body mass index is 37.41 kg/m.  Treatment Plan Summary: Daily contact with patient to assess and evaluate symptoms and progress in treatment, Medication management, and Plan to discharge to a group home  Continue on current medication regimen  Continue safety monitoring per unit protocol  Olin Pia, NP 10/26/2022 10:50 AM

## 2022-10-26 NOTE — ED Notes (Signed)
Pt sleeping at present, no distress noted.  Monitoring for safety. 

## 2022-10-27 DIAGNOSIS — N9489 Other specified conditions associated with female genital organs and menstrual cycle: Secondary | ICD-10-CM | POA: Diagnosis not present

## 2022-10-27 DIAGNOSIS — N390 Urinary tract infection, site not specified: Secondary | ICD-10-CM | POA: Diagnosis not present

## 2022-10-27 DIAGNOSIS — E039 Hypothyroidism, unspecified: Secondary | ICD-10-CM | POA: Diagnosis not present

## 2022-10-27 DIAGNOSIS — E118 Type 2 diabetes mellitus with unspecified complications: Secondary | ICD-10-CM | POA: Diagnosis not present

## 2022-10-27 DIAGNOSIS — Z1152 Encounter for screening for COVID-19: Secondary | ICD-10-CM | POA: Diagnosis not present

## 2022-10-27 DIAGNOSIS — F333 Major depressive disorder, recurrent, severe with psychotic symptoms: Secondary | ICD-10-CM | POA: Diagnosis not present

## 2022-10-27 DIAGNOSIS — F17200 Nicotine dependence, unspecified, uncomplicated: Secondary | ICD-10-CM | POA: Diagnosis not present

## 2022-10-27 DIAGNOSIS — F209 Schizophrenia, unspecified: Secondary | ICD-10-CM | POA: Diagnosis not present

## 2022-10-27 DIAGNOSIS — F419 Anxiety disorder, unspecified: Secondary | ICD-10-CM | POA: Diagnosis not present

## 2022-10-27 NOTE — ED Notes (Signed)
Patient  sleeping in no acute stress. RR even and unlabored .Environment secured .Will continue to monitor for safely. 

## 2022-10-27 NOTE — ED Provider Notes (Signed)
Behavioral Health Progress Note  Date and Time: 10/27/2022 9:39 AM Name: Nicole [name] MRN:  [id number]  Subjective: Patient seen and evaluated face-to-face by this provider, and chart reviewed. On evaluation, patient is lying down in bed. She awakens to complete the assessment. She is alert and oriented x 4. Her thought process is linear. Her speech is clear and coherent. Her mood is euthymic and affect is congruent. She denies suicidal ideations. She denies homicidal ideations. She denies auditory or visual hallucinations. There is no objective evidence that the patient is currently responding to internal or external stimuli. She reports fair appetite. She reports fair sleep. She denies medication side effects. She reports intermittent back pain. She was advised to change positions or rotate in bed as needed for comfort and to ask the nursing staff for tylenol. She denies concerns or questions at this time.  Nicole [name] 32 y.o., female patient with a past history of schizophrenia, and MDD with psychosis, who initially presented to [facility], voluntarily, as a walk-in accompanied by [employer] after a disagreement and verbal altercation with personnel at the group home. She was dismissed from previous and her most recent group home due to threatening behaviors, and suicidal ideation.  She is currently bordering at the [name], awaiting group home placement. Nicole [name] will move into [facility] on November 04, 2022 at 10am    Legal Guardian: [name]  Diagnosis:  Final diagnoses:  Tobacco use disorder  Schizophrenia, unspecified type (HCC)  MDD (major depressive disorder), recurrent episode, moderate (HCC)  Hypothyroidism, unspecified type    Total Time spent with patient: 15 minutes  Past Psychiatric History: schizophrenia, and MDD with psychosis,  Past Medical History:  Past Medical History:  Diagnosis  Date   Anxiety    Depression    Hypothyroidism 08/07/2022   Tobacco use disorder 08/07/2022    Past Surgical History:  Procedure Laterality Date   WISDOM TOOTH EXTRACTION Bilateral 2020   Family History:  Family History  Problem Relation Age of Onset   Hypertension Father    Diabetes Father    Family Psychiatric  History: No history reported.  Social History:  Social History   Substance and Sexual Activity  Alcohol Use Never     Social History   Substance and Sexual Activity  Drug Use Never    Social History   Socioeconomic History   Marital status: Single    Spouse name: Not on file   Number of children: Not on file   Years of education: Not on file   Highest education level: Not on file  Occupational History   Not on file  Tobacco Use   Smoking status: Every Day    Types: Cigarettes   Smokeless tobacco: Never  Vaping Use   Vaping Use: Some days  Substance and Sexual Activity   Alcohol use: Never   Drug use: Never   Sexual activity: Yes    Partners: Female    Birth control/protection: Implant  Other Topics Concern   Not on file  Social History Narrative   Not on file   Social Determinants of Health   Financial Resource Strain: Not on file  Food Insecurity: Not on file  Transportation Needs: Not on file  Physical Activity: Not on file  Stress: Not on file  Social Connections: Not on file   SDOH:  SDOH Screenings   Depression (PHQ2-9): Low Risk  (10/26/2022)  Tobacco Use: High Risk (10/21/2022)    Current Medications:  Current Facility-Administered Medications  Medication Dose Route Frequency Provider Last Rate Last Admin   acetaminophen (TYLENOL) tablet 650 mg  650 mg Oral Q6H PRN Marlou Sa, NP   650 mg at 10/24/22 2013   alum & mag hydroxide-simeth (MAALOX/MYLANTA) 200-200-20 MG/5ML suspension 30 mL  30 mL Oral Q4H PRN Marlou Sa, NP   30 mL at 10/26/22 1755   ARIPiprazole ER (ABILIFY MAINTENA) 400 MG prefilled syringe  400 mg  400 mg Intramuscular Q28 days Princess Bruins, DO   400 mg at 10/25/22 0920   cetaphil lotion   Topical PRN Princess Bruins, DO       fluticasone [facility]) 50 MCG/ACT nasal spray 1 spray  1 spray Each Nare QHS Princess Bruins, DO   1 spray at 10/26/22 2114   hydrOXYzine (ATARAX) tablet 25 mg  25 mg Oral TID PRN Lorri Frederick, MD   25 mg at 10/25/22 1949   ketoconazole (NIZORAL) 2 % cream   Topical BID Princess Bruins, DO   Given at 10/27/22 0981   levothyroxine (SYNTHROID) tablet 100 mcg  100 mcg Oral Once Byungura, Veronique M, NP       levothyroxine (SYNTHROID) tablet 100 mcg  100 mcg Oral Q0600 Marlou Sa, NP   100 mcg at 10/27/22 0649   lidocaine (LIDODERM) 5 % 1 patch  1 patch Transdermal Q24H Princess Bruins, DO   1 patch at 10/27/22 0857   loratadine (CLARITIN) tablet 10 mg  10 mg Oral Daily Carrion-Carrero, Karle Starch, MD   10 mg at 10/27/22 0856   magnesium hydroxide (MILK OF MAGNESIA) suspension 30 mL  30 mL Oral Daily PRN Marlou Sa, NP   30 mL at 10/12/22 2145   metFORMIN (GLUCOPHAGE) tablet 500 mg  500 mg Oral Q breakfast Carrion-Carrero, Karle Starch, MD   500 mg at 10/27/22 0855   nicotine (NICODERM CQ - dosed in mg/24 hours) patch 14 mg  14 mg Transdermal Daily PRN Carrion-Carrero, Karle Starch, MD       ondansetron (ZOFRAN-ODT) disintegrating tablet 4 mg  4 mg Oral Q8H PRN Carrion-Carrero, Margely, MD   4 mg at 10/22/22 1806   Oxcarbazepine (TRILEPTAL) tablet 300 mg  300 mg Oral BID Rayburn Go, Veronique M, NP   300 mg at 10/27/22 0856   pantoprazole (PROTONIX) EC tablet 40 mg  40 mg Oral Daily Carrion-Carrero, Karle Starch, MD   40 mg at 10/27/22 0856   polyethylene glycol (MIRALAX / GLYCOLAX) packet 17 g  17 g Oral Daily Princess Bruins, DO   17 g at 10/27/22 0857   QUEtiapine (SEROQUEL) tablet 400 mg  400 mg Oral BID Olin Pia M, NP   400 mg at 10/27/22 0856   sertraline (ZOLOFT) tablet 150 mg  150 mg Oral Daily Carrion-Carrero, Margely, MD   150 mg at 10/27/22  0855   sodium chloride (OCEAN) 0.65 % nasal spray 1 spray  1 spray Each Nare Daily Princess Bruins, DO   1 spray at 10/27/22 0858   traZODone (DESYREL) tablet 100 mg  100 mg Oral QHS Olin Pia M, NP   100 mg at 10/26/22 2113   traZODone (DESYREL) tablet 50 mg  50 mg Oral QHS PRN Marlou Sa, NP   50 mg at 10/26/22 2113   valACYclovir (VALTREX) tablet 500 mg  500 mg Oral Daily Carrion-Carrero, Karle Starch, MD   500 mg at 10/27/22 0856   zinc oxide (BALMEX) 11.3 % cream  Topical PRN Merrily Brittle, DO       Current Outpatient Medications  Medication Sig Dispense Refill   ABILIFY MAINTENA 400 MG PRSY prefilled syringe 400 mg every 28 (twenty-eight) days.     cetirizine (ZYRTEC) 10 MG tablet Take 10 mg by mouth daily.     cyclobenzaprine (FLEXERIL) 10 MG tablet Take 1 tablet (10 mg total) by mouth 2 (two) times daily as needed for muscle spasms. 20 tablet 0   fluticasone (FLONASE) 50 MCG/ACT nasal spray Place 1 spray into both nostrils daily.     meloxicam (MOBIC) 15 MG tablet Take 15 mg by mouth daily.     nitrofurantoin, macrocrystal-monohydrate, (MACROBID) 100 MG capsule Take 1 capsule (100 mg total) by mouth 2 (two) times daily. 10 capsule 0   ondansetron (ZOFRAN-ODT) 4 MG disintegrating tablet Take 1 tablet (4 mg total) by mouth every 8 (eight) hours as needed for nausea or vomiting. 20 tablet 0   Oxcarbazepine (TRILEPTAL) 300 MG tablet Take 1 tablet (300 mg total) by mouth 2 (two) times daily. 60 tablet 0   QUEtiapine (SEROQUEL) 400 MG tablet Take 1 tablet (400 mg total) by mouth 2 (two) times daily. 60 tablet 0   sertraline (ZOLOFT) 50 MG tablet Take 3 tablets (150 mg total) by mouth in the morning. 90 tablet 0   traZODone (DESYREL) 100 MG tablet Take 1 tablet (100 mg total) by mouth at bedtime. 30 tablet 0   valACYclovir (VALTREX) 500 MG tablet Take 500 mg by mouth daily.      Labs  Lab Results:  Admission on 10/07/2022  Component Date Value Ref Range Status   Hgb A1c MFr  Bld 10/15/2022 5.7 (H)  4.8 - 5.6 % Final   Comment: (NOTE)         Prediabetes: 5.7 - 6.4         Diabetes: >6.4         Glycemic control for adults with diabetes: <7.0    Mean Plasma Glucose 10/15/2022 117  mg/dL Final   Comment: (NOTE) Performed At: [facility] [place], [place] [id number] Rush Farmer MD [id number]   Admission on 10/07/2022, Discharged on 10/07/2022  Component Date Value Ref Range Status   Color, Urine 10/07/2022 YELLOW  YELLOW Final   APPearance 10/07/2022 HAZY (A)  CLEAR Final   Specific Gravity, Urine 10/07/2022 1.011  1.005 - 1.030 Final   pH 10/07/2022 7.0  5.0 - 8.0 Final   Glucose, UA 10/07/2022 NEGATIVE  NEGATIVE mg/dL Final   Hgb urine dipstick 10/07/2022 SMALL (A)  NEGATIVE Final   Bilirubin Urine 10/07/2022 NEGATIVE  NEGATIVE Final   Ketones, ur 10/07/2022 NEGATIVE  NEGATIVE mg/dL Final   Protein, ur 10/07/2022 NEGATIVE  NEGATIVE mg/dL Final   Nitrite 10/07/2022 NEGATIVE  NEGATIVE Final   Leukocytes,Ua 10/07/2022 LARGE (A)  NEGATIVE Final   RBC / HPF 10/07/2022 0-5  0 - 5 RBC/hpf Final   WBC, UA 10/07/2022 0-5  0 - 5 WBC/hpf Final   Bacteria, UA 10/07/2022 FEW (A)  NONE SEEN Final   Squamous Epithelial / HPF 10/07/2022 11-20  0 - 5 Final   Performed at [facility], [place] [address] [place], [place] [address]   I-stat hCG, quantitative 10/07/2022 <5.0  <5 mIU/mL Final   Comment 3 10/07/2022          Final   Comment:   GEST. AGE      CONC.  (mIU/mL)   <=1 WEEK  5 - 50     2 WEEKS       50 - 500     3 WEEKS       100 - 10,000     4 WEEKS     1,000 - 30,000        FEMALE AND NON-PREGNANT FEMALE:     LESS THAN 5 mIU/mL    Sodium 10/07/2022 138  135 - 145 mmol/L Final   Potassium 10/07/2022 4.3  3.5 - 5.1 mmol/L Final   Chloride 10/07/2022 104  98 - 111 mmol/L Final   CO2 10/07/2022 28  22 - 32 mmol/L Final   Glucose, Bld 10/07/2022 90  70 - 99 mg/dL Final   Glucose reference range applies only to samples  taken after fasting for at least 8 hours.   BUN 10/07/2022 8  6 - 20 mg/dL Final   Creatinine, Ser 10/07/2022 0.96  0.44 - 1.00 mg/dL Final   Calcium 45/40/981112/25/2023 9.1  8.9 - 10.3 mg/dL Final   GFR, Estimated 10/07/2022 >60  >60 mL/min Final   Comment: (NOTE) Calculated using the CKD-EPI Creatinine Equation (2021)    Anion gap 10/07/2022 6  5 - 15 Final   Performed at [facility], [address] [place], [place] [address]   WBC 10/07/2022 5.8  4.0 - 10.5 K/uL Final   RBC 10/07/2022 4.36  3.87 - 5.11 MIL/uL Final   Hemoglobin 10/07/2022 12.9  12.0 - 15.0 g/dL Final   HCT 29/56/213012/25/2023 40.0  36.0 - 46.0 % Final   MCV 10/07/2022 91.7  80.0 - 100.0 fL Final   MCH 10/07/2022 29.6  26.0 - 34.0 pg Final   MCHC 10/07/2022 32.3  30.0 - 36.0 g/dL Final   RDW 86/57/846912/25/2023 12.4  11.5 - 15.5 % Final   Platelets 10/07/2022 295  150 - 400 K/uL Final   nRBC 10/07/2022 0.0  0.0 - 0.2 % Final   Performed at [facility], [address] [place], [place] [address]   SARS Coronavirus 2 by RT PCR 10/07/2022 NEGATIVE  NEGATIVE Final   Comment: (NOTE) SARS-CoV-2 target nucleic acids are NOT DETECTED.  The SARS-CoV-2 RNA is generally detectable in upper respiratory specimens during the acute phase of infection. The lowest concentration of SARS-CoV-2 viral copies this assay can detect is 138 copies/mL. A negative result does not preclude SARS-Cov-2 infection and should not be used as the sole basis for treatment or other patient management decisions. A negative result may occur with  improper specimen collection/handling, submission of specimen other than nasopharyngeal swab, presence of viral mutation(s) within the areas targeted by this assay, and inadequate number of viral copies(<138 copies/mL). A negative result must be combined with clinical observations, patient history, and epidemiological information. The expected result is Negative.  Fact Sheet for Patients:   BloggerCourse.comhttps://www.fda.gov/media/152166/download  Fact Sheet for Healthcare Providers:  SeriousBroker.ithttps://www.fda.gov/media/152162/download  This test is no                          t yet approved or cleared by the [place] FDA and  has been authorized for detection and/or diagnosis of SARS-CoV-2 by FDA under an Emergency Use Authorization (EUA). This EUA will remain  in effect (meaning this test can be used) for the duration of the COVID-19 declaration under Section 564(b)(1) of the Act, 21 U.S.C.section 360bbb-3(b)(1), unless the authorization is terminated  or revoked sooner.       Influenza  A by PCR 10/07/2022 NEGATIVE  NEGATIVE Final   Influenza B by PCR 10/07/2022 NEGATIVE  NEGATIVE Final   Comment: (NOTE) The Xpert Xpress SARS-CoV-2/FLU/RSV plus assay is intended as an aid in the diagnosis of influenza from Nasopharyngeal swab specimens and should not be used as a sole basis for treatment. Nasal washings and aspirates are unacceptable for Xpert Xpress SARS-CoV-2/FLU/RSV testing.  Fact Sheet for Patients: BloggerCourse.com  Fact Sheet for Healthcare Providers: SeriousBroker.it  This test is not yet approved or cleared by the [place] FDA and has been authorized for detection and/or diagnosis of SARS-CoV-2 by FDA under an Emergency Use Authorization (EUA). This EUA will remain in effect (meaning this test can be used) for the duration of the COVID-19 declaration under Section 564(b)(1) of the Act, 21 U.S.C. section 360bbb-3(b)(1), unless the authorization is terminated or revoked.     Resp Syncytial Virus by PCR 10/07/2022 NEGATIVE  NEGATIVE Final   Comment: (NOTE) Fact Sheet for Patients: BloggerCourse.com  Fact Sheet for Healthcare Providers: SeriousBroker.it  This test is not yet approved or cleared by the [place] FDA and has been authorized for detection and/or  diagnosis of SARS-CoV-2 by FDA under an Emergency Use Authorization (EUA). This EUA will remain in effect (meaning this test can be used) for the duration of the COVID-19 declaration under Section 564(b)(1) of the Act, 21 U.S.C. section 360bbb-3(b)(1), unless the authorization is terminated or revoked.  Performed at [facility], [address], [place], [place] [address]    Specimen Description 10/07/2022 URINE, CLEAN CATCH   Final   Special Requests 10/07/2022    Final                   Value:NONE Performed at [facility], [address], [place], [place] [address]    Culture 10/07/2022 MULTIPLE SPECIES PRESENT, SUGGEST RECOLLECTION (A)   Final   Report Status 10/07/2022 10/08/2022 FINAL   Final  Admission on 07/25/2022, Discharged on 10/07/2022  Component Date Value Ref Range Status   SARS Coronavirus 2 by RT PCR 07/25/2022 NEGATIVE  NEGATIVE Final   Comment: (NOTE) SARS-CoV-2 target nucleic acids are NOT DETECTED.  The SARS-CoV-2 RNA is generally detectable in upper respiratory specimens during the acute phase of infection. The lowest concentration of SARS-CoV-2 viral copies this assay can detect is 138 copies/mL. A negative result does not preclude SARS-Cov-2 infection and should not be used as the sole basis for treatment or other patient management decisions. A negative result may occur with  improper specimen collection/handling, submission of specimen other than nasopharyngeal swab, presence of viral mutation(s) within the areas targeted by this assay, and inadequate number of viral copies(<138 copies/mL). A negative result must be combined with clinical observations, patient history, and epidemiological information. The expected result is Negative.  Fact Sheet for Patients:  BloggerCourse.com  Fact Sheet for Healthcare Providers:  SeriousBroker.it  This test is no                          t yet approved  or cleared by the [place] FDA and  has been authorized for detection and/or diagnosis of SARS-CoV-2 by FDA under an Emergency Use Authorization (EUA). This EUA will remain  in effect (meaning this test can be used) for the duration of the COVID-19 declaration under Section 564(b)(1) of the Act, 21 U.S.C.section 360bbb-3(b)(1), unless the authorization is terminated  or revoked sooner.  Influenza A by PCR 07/25/2022 NEGATIVE  NEGATIVE Final   Influenza B by PCR 07/25/2022 NEGATIVE  NEGATIVE Final   Comment: (NOTE) The Xpert Xpress SARS-CoV-2/FLU/RSV plus assay is intended as an aid in the diagnosis of influenza from Nasopharyngeal swab specimens and should not be used as a sole basis for treatment. Nasal washings and aspirates are unacceptable for Xpert Xpress SARS-CoV-2/FLU/RSV testing.  Fact Sheet for Patients: BloggerCourse.com  Fact Sheet for Healthcare Providers: SeriousBroker.it  This test is not yet approved or cleared by the [place] FDA and has been authorized for detection and/or diagnosis of SARS-CoV-2 by FDA under an Emergency Use Authorization (EUA). This EUA will remain in effect (meaning this test can be used) for the duration of the COVID-19 declaration under Section 564(b)(1) of the Act, 21 U.S.C. section 360bbb-3(b)(1), unless the authorization is terminated or revoked.  Performed at [facility], [address], [place], [place] [address]    WBC 07/25/2022 8.3  4.0 - 10.5 K/uL Final   RBC 07/25/2022 4.44  3.87 - 5.11 MIL/uL Final   Hemoglobin 07/25/2022 13.7  12.0 - 15.0 g/dL Final   HCT 09/32/3557 40.2  36.0 - 46.0 % Final   MCV 07/25/2022 90.5  80.0 - 100.0 fL Final   MCH 07/25/2022 30.9  26.0 - 34.0 pg Final   MCHC 07/25/2022 34.1  30.0 - 36.0 g/dL Final   RDW 32/20/2542 12.2  11.5 - 15.5 % Final   Platelets 07/25/2022 248  150 - 400 K/uL Final   nRBC 07/25/2022 0.0  0.0 - 0.2 %  Final   Neutrophils Relative % 07/25/2022 43  % Final   Neutro Abs 07/25/2022 3.6  1.7 - 7.7 K/uL Final   Lymphocytes Relative 07/25/2022 52  % Final   Lymphs Abs 07/25/2022 4.2 (H)  0.7 - 4.0 K/uL Final   Monocytes Relative 07/25/2022 5  % Final   Monocytes Absolute 07/25/2022 0.4  0.1 - 1.0 K/uL Final   Eosinophils Relative 07/25/2022 0  % Final   Eosinophils Absolute 07/25/2022 0.0  0.0 - 0.5 K/uL Final   Basophils Relative 07/25/2022 0  % Final   Basophils Absolute 07/25/2022 0.0  0.0 - 0.1 K/uL Final   Immature Granulocytes 07/25/2022 0  % Final   Abs Immature Granulocytes 07/25/2022 0.02  0.00 - 0.07 K/uL Final   Performed at [facility], [address], [place], [place] [address]   Sodium 07/25/2022 138  135 - 145 mmol/L Final   Potassium 07/25/2022 4.0  3.5 - 5.1 mmol/L Final   Chloride 07/25/2022 104  98 - 111 mmol/L Final   CO2 07/25/2022 29  22 - 32 mmol/L Final   Glucose, Bld 07/25/2022 83  70 - 99 mg/dL Final   Glucose reference range applies only to samples taken after fasting for at least 8 hours.   BUN 07/25/2022 11  6 - 20 mg/dL Final   Creatinine, Ser 07/25/2022 0.97  0.44 - 1.00 mg/dL Final   Calcium 76/28/3151 9.2  8.9 - 10.3 mg/dL Final   Total Protein 76/16/0737 7.0  6.5 - 8.1 g/dL Final   Albumin 10/62/6948 3.8  3.5 - 5.0 g/dL Final   AST 54/62/7035 18  15 - 41 U/L Final   ALT 07/25/2022 22  0 - 44 U/L Final   Alkaline Phosphatase 07/25/2022 64  38 - 126 U/L Final   Total Bilirubin 07/25/2022 0.2 (L)  0.3 - 1.2 mg/dL Final   GFR, Estimated 07/25/2022 >60  >  60 mL/min Final   Comment: (NOTE) Calculated using the CKD-EPI Creatinine Equation (2021)    Anion gap 07/25/2022 5  5 - 15 Final   Performed at [facility], [place] [address] [place], [place] [address]   POC Amphetamine UR 07/25/2022 None Detected  NONE DETECTED (Cut Off Level 1000 ng/mL) Preliminary   POC Secobarbital (BAR) 07/25/2022 None Detected  NONE DETECTED (Cut Off Level 300 ng/mL)  Preliminary   POC Buprenorphine (BUP) 07/25/2022 None Detected  NONE DETECTED (Cut Off Level 10 ng/mL) Preliminary   POC Oxazepam (BZO) 07/25/2022 None Detected  NONE DETECTED (Cut Off Level 300 ng/mL) Preliminary   POC Cocaine UR 07/25/2022 None Detected  NONE DETECTED (Cut Off Level 300 ng/mL) Preliminary   POC Methamphetamine UR 07/25/2022 None Detected  NONE DETECTED (Cut Off Level 1000 ng/mL) Preliminary   POC Morphine 07/25/2022 None Detected  NONE DETECTED (Cut Off Level 300 ng/mL) Preliminary   POC Methadone UR 07/25/2022 None Detected  NONE DETECTED (Cut Off Level 300 ng/mL) Preliminary   POC Oxycodone UR 07/25/2022 None Detected  NONE DETECTED (Cut Off Level 100 ng/mL) Preliminary   POC Marijuana UR 07/25/2022 None Detected  NONE DETECTED (Cut Off Level 50 ng/mL) Preliminary   SARSCOV2ONAVIRUS 2 AG 07/25/2022 NEGATIVE  NEGATIVE Final   Comment: (NOTE) SARS-CoV-2 antigen NOT DETECTED.   Negative results are presumptive.  Negative results do not preclude SARS-CoV-2 infection and should not be used as the sole basis for treatment or other patient management decisions, including infection  control decisions, particularly in the presence of clinical signs and  symptoms consistent with COVID-19, or in those who have been in contact with the virus.  Negative results must be combined with clinical observations, patient history, and epidemiological information. The expected result is Negative.  Fact Sheet for Patients: HandmadeRecipes.com.cy  Fact Sheet for Healthcare Providers: FuneralLife.at  This test is not yet approved or cleared by the [place] FDA and  has been authorized for detection and/or diagnosis of SARS-CoV-2 by FDA under an Emergency Use Authorization (EUA).  This EUA will remain in effect (meaning this test can be used) for the duration of  the COV                          ID-19 declaration under Section 564(b)(1) of  the Act, 21 U.S.C. section 360bbb-3(b)(1), unless the authorization is terminated or revoked sooner.     Cholesterol 07/25/2022 181  0 - 200 mg/dL Final   Triglycerides 07/25/2022 118  <150 mg/dL Final   HDL 07/25/2022 45  >40 mg/dL Final   Total CHOL/HDL Ratio 07/25/2022 4.0  RATIO Final   VLDL 07/25/2022 24  0 - 40 mg/dL Final   LDL Cholesterol 07/25/2022 112 (H)  0 - 99 mg/dL Final   Comment:        Total Cholesterol/HDL:CHD Risk Coronary Heart Disease Risk Table                     Men   Women  1/2 Average Risk   3.4   3.3  Average Risk       5.0   4.4  2 X Average Risk   9.6   7.1  3 X Average Risk  23.4   11.0        Use the calculated Patient Ratio above and the CHD Risk Table to determine the patient's CHD Risk.  ATP III CLASSIFICATION (LDL):  <100     mg/dL   Optimal  102-725  mg/dL   Near or Above                    Optimal  130-159  mg/dL   Borderline  366-440  mg/dL   High  >347     mg/dL   Very High Performed at [facility], [address] [place], [place] [address]    TSH 07/25/2022 6.668 (H)  0.350 - 4.500 uIU/mL Final   Comment: Performed by a 3rd Generation assay with a functional sensitivity of <=0.01 uIU/mL. Performed at [facility], [address], [place], [place] [address]    Glucose-Capillary 07/26/2022 104 (H)  70 - 99 mg/dL Final   Glucose reference range applies only to samples taken after fasting for at least 8 hours.   T3, Free 07/31/2022 2.3  2.0 - 4.4 pg/mL Final   Comment: (NOTE) Performed At: [facility] [address] [place], [place] [id number] Jolene Schimke MD [id number]    Free T4 07/31/2022 0.60 (L)  0.61 - 1.12 ng/dL Final   Comment: (NOTE) Biotin ingestion may interfere with free T4 tests. If the results are inconsistent with the TSH level, previous test results, or the clinical presentation, then consider biotin interference. If needed, order repeat testing after stopping biotin. Performed at  [facility], [address] [place], [place] [address]    Glucose-Capillary 08/29/2022 100 (H)  70 - 99 mg/dL Final   Glucose reference range applies only to samples taken after fasting for at least 8 hours.   TSH 09/05/2022 0.793  0.350 - 4.500 uIU/mL Final   Comment: Performed by a 3rd Generation assay with a functional sensitivity of <=0.01 uIU/mL. Performed at [facility], [address] [place], [place] [address]    Free T4 09/05/2022 0.73  0.61 - 1.12 ng/dL Final   Comment: (NOTE) Biotin ingestion may interfere with free T4 tests. If the results are inconsistent with the TSH level, previous test results, or the clinical presentation, then consider biotin interference. If needed, order repeat testing after stopping biotin. Performed at [facility], [address] [address] [place], [place] [address]    Preg Test, Ur 09/06/2022 Negative  Negative Final   Preg Test, Ur 09/06/2022 NEGATIVE  NEGATIVE Final   Comment:        THE SENSITIVITY OF THIS METHODOLOGY IS >24 mIU/mL    Preg Test, Ur 09/05/2022 NEGATIVE  NEGATIVE Final   Comment:        THE SENSITIVITY OF THIS METHODOLOGY IS >24 mIU/mL    Sodium 09/13/2022 136  135 - 145 mmol/L Final   Potassium 09/13/2022 4.5  3.5 - 5.1 mmol/L Final   Chloride 09/13/2022 105  98 - 111 mmol/L Final   CO2 09/13/2022 21 (L)  22 - 32 mmol/L Final   Glucose, Bld 09/13/2022 111 (H)  70 - 99 mg/dL Final   Glucose reference range applies only to samples taken after fasting for at least 8 hours.   BUN 09/13/2022 14  6 - 20 mg/dL Final   Creatinine, Ser 09/13/2022 0.92  0.44 - 1.00 mg/dL Final   Calcium 42/70/6237 9.1  8.9 - 10.3 mg/dL Final   GFR, Estimated 09/13/2022 >60  >60 mL/min Final   Comment: (NOTE) Calculated using the CKD-EPI Creatinine Equation (2021)    Anion gap 09/13/2022 10  5 - 15 Final  Performed at [facility], [address] [place], [place] [address]   Vitamin B-12 09/13/2022 585  180 - 914 pg/mL  Final   Comment: (NOTE) This assay is not validated for testing neonatal or myeloproliferative syndrome specimens for Vitamin B12 levels. Performed at [facility], [address] [place], [place] [address]    Color, Urine 09/14/2022 YELLOW  YELLOW Final   APPearance 09/14/2022 HAZY (A)  CLEAR Final   Specific Gravity, Urine 09/14/2022 1.025  1.005 - 1.030 Final   pH 09/14/2022 5.0  5.0 - 8.0 Final   Glucose, UA 09/14/2022 NEGATIVE  NEGATIVE mg/dL Final   Hgb urine dipstick 09/14/2022 NEGATIVE  NEGATIVE Final   Bilirubin Urine 09/14/2022 NEGATIVE  NEGATIVE Final   Ketones, ur 09/14/2022 NEGATIVE  NEGATIVE mg/dL Final   Protein, ur 62/95/284112/11/2021 NEGATIVE  NEGATIVE mg/dL Final   Nitrite 32/44/010212/11/2021 NEGATIVE  NEGATIVE Final   Leukocytes,Ua 09/14/2022 TRACE (A)  NEGATIVE Final   RBC / HPF 09/14/2022 0-5  0 - 5 RBC/hpf Final   WBC, UA 09/14/2022 0-5  0 - 5 WBC/hpf Final   Bacteria, UA 09/14/2022 RARE (A)  NONE SEEN Final   Squamous Epithelial / HPF 09/14/2022 0-5  0 - 5 Final   Mucus 09/14/2022 PRESENT   Final   Performed at [facility], [address] [place], [place] [address]   Glucose-Capillary 09/16/2022 105 (H)  70 - 99 mg/dL Final   Glucose reference range applies only to samples taken after fasting for at least 8 hours.   SARS Coronavirus 2 by RT PCR 09/30/2022 NEGATIVE  NEGATIVE Final   Comment: (NOTE) SARS-CoV-2 target nucleic acids are NOT DETECTED.  The SARS-CoV-2 RNA is generally detectable in upper respiratory specimens during the acute phase of infection. The lowest concentration of SARS-CoV-2 viral copies this assay can detect is 138 copies/mL. A negative result does not preclude SARS-Cov-2 infection and should not be used as the sole basis for treatment or other patient management decisions. A negative result may occur with  improper specimen collection/handling, submission of specimen other than nasopharyngeal swab, presence of viral mutation(s) within  the areas targeted by this assay, and inadequate number of viral copies(<138 copies/mL). A negative result must be combined with clinical observations, patient history, and epidemiological information. The expected result is Negative.  Fact Sheet for Patients:  BloggerCourse.comhttps://www.fda.gov/media/152166/download  Fact Sheet for Healthcare Providers:  SeriousBroker.ithttps://www.fda.gov/media/152162/download  This test is no                          t yet approved or cleared by the [place] FDA and  has been authorized for detection and/or diagnosis of SARS-CoV-2 by FDA under an Emergency Use Authorization (EUA). This EUA will remain  in effect (meaning this test can be used) for the duration of the COVID-19 declaration under Section 564(b)(1) of the Act, 21 U.S.C.section 360bbb-3(b)(1), unless the authorization is terminated  or revoked sooner.       Influenza A by PCR 09/30/2022 NEGATIVE  NEGATIVE Final   Influenza B by PCR 09/30/2022 NEGATIVE  NEGATIVE Final   Comment: (NOTE) The Xpert Xpress SARS-CoV-2/FLU/RSV plus assay is intended as an aid in the diagnosis of influenza from Nasopharyngeal swab specimens and should not be used as a sole basis for treatment. Nasal washings and aspirates are unacceptable for Xpert Xpress SARS-CoV-2/FLU/RSV testing.  Fact Sheet for Patients: BloggerCourse.comhttps://www.fda.gov/media/152166/download  Fact Sheet for Healthcare Providers: SeriousBroker.ithttps://www.fda.gov/media/152162/download  This  test is not yet approved or cleared by the [place] and has been authorized for detection and/or diagnosis of SARS-CoV-2 by FDA under an Emergency Use Authorization (EUA). This EUA will remain in effect (meaning this test can be used) for the duration of the COVID-19 declaration under Section 564(b)(1) of the Act, 21 U.S.C. section 360bbb-3(b)(1), unless the authorization is terminated or revoked.     Resp Syncytial Virus by PCR 09/30/2022 NEGATIVE  NEGATIVE Final   Comment:  (NOTE) Fact Sheet for Patients: BloggerCourse.com  Fact Sheet for Healthcare Providers: SeriousBroker.it  This test is not yet approved or cleared by the [place] FDA and has been authorized for detection and/or diagnosis of SARS-CoV-2 by FDA under an Emergency Use Authorization (EUA). This EUA will remain in effect (meaning this test can be used) for the duration of the COVID-19 declaration under Section 564(b)(1) of the Act, 21 U.S.C. section 360bbb-3(b)(1), unless the authorization is terminated or revoked.  Performed at [facility], [address] [place], [place] [address]    Sodium 10/01/2022 136  135 - 145 mmol/L Final   Potassium 10/01/2022 3.8  3.5 - 5.1 mmol/L Final   Chloride 10/01/2022 104  98 - 111 mmol/L Final   CO2 10/01/2022 27  22 - 32 mmol/L Final   Glucose, Bld 10/01/2022 98  70 - 99 mg/dL Final   Glucose reference range applies only to samples taken after fasting for at least 8 hours.   BUN 10/01/2022 12  6 - 20 mg/dL Final   Creatinine, Ser 10/01/2022 0.98  0.44 - 1.00 mg/dL Final   Calcium 40/07/2724 8.9  8.9 - 10.3 mg/dL Final   GFR, Estimated 10/01/2022 >60  >60 mL/min Final   Comment: (NOTE) Calculated using the CKD-EPI Creatinine Equation (2021)    Anion gap 10/01/2022 5  5 - 15 Final   Performed at [facility], [address], [place], [place] [address]   WBC 10/01/2022 5.1  4.0 - 10.5 K/uL Final   RBC 10/01/2022 4.31  3.87 - 5.11 MIL/uL Final   Hemoglobin 10/01/2022 12.7  12.0 - 15.0 g/dL Final   HCT 03/47/4259 38.8  36.0 - 46.0 % Final   MCV 10/01/2022 90.0  80.0 - 100.0 fL Final   MCH 10/01/2022 29.5  26.0 - 34.0 pg Final   MCHC 10/01/2022 32.7  30.0 - 36.0 g/dL Final   RDW 56/38/7564 12.4  11.5 - 15.5 % Final   Platelets 10/01/2022 300  150 - 400 K/uL Final   nRBC 10/01/2022 0.0  0.0 - 0.2 % Final   Performed at [facility], [address] [place], [place] [address]    Lipase 10/01/2022 29  11 - 51 U/L Final   Performed at [facility], [address] [place], [place] [address]   Color, Urine 10/05/2022 YELLOW  YELLOW Final   APPearance 10/05/2022 HAZY (A)  CLEAR Final   Specific Gravity, Urine 10/05/2022 1.018  1.005 - 1.030 Final   pH 10/05/2022 5.0  5.0 - 8.0 Final   Glucose, UA 10/05/2022 NEGATIVE  NEGATIVE mg/dL Final   Hgb urine dipstick 10/05/2022 NEGATIVE  NEGATIVE Final   Bilirubin Urine 10/05/2022 NEGATIVE  NEGATIVE Final   Ketones, ur 10/05/2022 NEGATIVE  NEGATIVE mg/dL Final   Protein, ur 66/03/3015 NEGATIVE  NEGATIVE mg/dL Final   Nitrite 10/22/3233 NEGATIVE  NEGATIVE Final   Leukocytes,Ua 10/05/2022 TRACE (A)  NEGATIVE Final   RBC / HPF 10/05/2022 0-5  0 - 5 RBC/hpf Final   WBC, UA 10/05/2022 0-5  0 - 5 WBC/hpf Final   Bacteria, UA 10/05/2022 RARE (A)  NONE SEEN Final   Squamous Epithelial / HPF 10/05/2022 0-5  0 - 5 Final   Mucus 10/05/2022 PRESENT   Final   Performed at [facility], [address] [place], [place] [address]   Specimen Description 10/05/2022 URINE, CLEAN CATCH   Final   Special Requests 10/05/2022    Final                   Value:NONE Performed at [facility], [address], [place], [place] [address]    Culture 10/05/2022 10,000 COLONIES/mL MULTIPLE SPECIES PRESENT, SUGGEST RECOLLECTION (A)   Final   Report Status 10/05/2022 10/07/2022 FINAL   Final  Admission on 07/04/2022, Discharged on 07/05/2022  Component Date Value Ref Range Status   Sodium 07/04/2022 142  135 - 145 mmol/L Final   Potassium 07/04/2022 4.4  3.5 - 5.1 mmol/L Final   Chloride 07/04/2022 109  98 - 111 mmol/L Final   CO2 07/04/2022 26  22 - 32 mmol/L Final   Glucose, Bld 07/04/2022 96  70 - 99 mg/dL Final   Glucose reference range applies only to samples taken after fasting for at least 8 hours.   BUN 07/04/2022 15  6 - 20 mg/dL Final   Creatinine, Ser 07/04/2022 0.83  0.44 - 1.00 mg/dL Final   Calcium 09/81/1914 9.3   8.9 - 10.3 mg/dL Final   Total Protein 78/29/5621 7.2  6.5 - 8.1 g/dL Final   Albumin 30/86/5784 3.9  3.5 - 5.0 g/dL Final   AST 69/62/9528 23  15 - 41 U/L Final   ALT 07/04/2022 28  0 - 44 U/L Final   Alkaline Phosphatase 07/04/2022 77  38 - 126 U/L Final   Total Bilirubin 07/04/2022 0.4  0.3 - 1.2 mg/dL Final   GFR, Estimated 07/04/2022 >60  >60 mL/min Final   Comment: (NOTE) Calculated using the CKD-EPI Creatinine Equation (2021)    Anion gap 07/04/2022 7  5 - 15 Final   Performed at [facility], [address], [place], [place] [address]   Alcohol, Ethyl (B) 07/04/2022 <10  <10 mg/dL Final   Comment: (NOTE) Lowest detectable limit for serum alcohol is 10 mg/dL.  For medical purposes only. Performed at [facility], [address], [place], [place] [address]    WBC 07/04/2022 7.0  4.0 - 10.5 K/uL Final   RBC 07/04/2022 4.18  3.87 - 5.11 MIL/uL Final   Hemoglobin 07/04/2022 12.8  12.0 - 15.0 g/dL Final   HCT 72/53/6644 39.1  36.0 - 46.0 % Final   MCV 07/04/2022 93.5  80.0 - 100.0 fL Final   MCH 07/04/2022 30.6  26.0 - 34.0 pg Final   MCHC 07/04/2022 32.7  30.0 - 36.0 g/dL Final   RDW 03/47/4259 12.9  11.5 - 15.5 % Final   Platelets 07/04/2022 243  150 - 400 K/uL Final   nRBC 07/04/2022 0.0  0.0 - 0.2 % Final   Neutrophils Relative % 07/04/2022 43  % Final   Neutro Abs 07/04/2022 3.0  1.7 - 7.7 K/uL Final   Lymphocytes Relative 07/04/2022 50  % Final   Lymphs Abs 07/04/2022 3.5  0.7 - 4.0 K/uL Final   Monocytes Relative 07/04/2022 7  % Final   Monocytes Absolute 07/04/2022 0.5  0.1 - 1.0 K/uL Final  Eosinophils Relative 07/04/2022 0  % Final   Eosinophils Absolute 07/04/2022 0.0  0.0 - 0.5 K/uL Final   Basophils Relative 07/04/2022 0  % Final   Basophils Absolute 07/04/2022 0.0  0.0 - 0.1 K/uL Final   Immature Granulocytes 07/04/2022 0  % Final   Abs Immature Granulocytes 07/04/2022 0.01  0.00 - 0.07 K/uL Final   Performed at [facility], [place] [address], [place], [place] [address]   I-stat hCG, quantitative 07/04/2022 <5.0  <5 mIU/mL Final   Comment 3 07/04/2022          Final   Comment:   GEST. AGE      CONC.  (mIU/mL)   <=1 WEEK        5 - 50     2 WEEKS       50 - 500     3 WEEKS       100 - 10,000     4 WEEKS     1,000 - 30,000        FEMALE AND NON-PREGNANT FEMALE:     LESS THAN 5 mIU/mL   Admission on 06/14/2022, Discharged on 06/14/2022  Component Date Value Ref Range Status   Color, UA 06/14/2022 yellow  yellow Final   Clarity, UA 06/14/2022 cloudy (A)  clear Final   Glucose, UA 06/14/2022 negative  negative mg/dL Final   Bilirubin, UA 06/14/2022 negative  negative Final   Ketones, POC UA 06/14/2022 negative  negative mg/dL Final   Spec Grav, UA 06/14/2022 1.025  1.010 - 1.025 Final   Blood, UA 06/14/2022 negative  negative Final   pH, UA 06/14/2022 7.5  5.0 - 8.0 Final   Protein Ur, POC 06/14/2022 =30 (A)  negative mg/dL Final   Urobilinogen, UA 06/14/2022 0.2  0.2 or 1.0 E.U./dL Final   Nitrite, UA 06/14/2022 Negative  Negative Final   Leukocytes, UA 06/14/2022 Negative  Negative Final   Preg Test, Ur 06/14/2022 Negative  Negative Final   Specimen Description 06/14/2022 URINE, CLEAN CATCH   Final   Special Requests 06/14/2022    Final                   Value:NONE Performed at [facility], [place] [address], [place], [place] [address]    Culture 06/14/2022 MULTIPLE SPECIES PRESENT, SUGGEST RECOLLECTION (A)   Final   Report Status 06/14/2022 06/16/2022 FINAL   Final  Admission on 06/07/2022, Discharged on 06/10/2022  Component Date Value Ref Range Status   SARS Coronavirus 2 by RT PCR 06/07/2022 NEGATIVE  NEGATIVE Final   Comment: (NOTE) SARS-CoV-2 target nucleic acids are NOT DETECTED.  The SARS-CoV-2 RNA is generally detectable in upper respiratory specimens during the acute phase of infection. The lowest concentration of SARS-CoV-2 viral copies this assay can detect  is 138 copies/mL. A negative result does not preclude SARS-Cov-2 infection and should not be used as the sole basis for treatment or other patient management decisions. A negative result may occur with  improper specimen collection/handling, submission of specimen other than nasopharyngeal swab, presence of viral mutation(s) within the areas targeted by this assay, and inadequate number of viral copies(<138 copies/mL). A negative result must be combined with clinical observations, patient history, and epidemiological information. The expected result is Negative.  Fact Sheet for Patients:  EntrepreneurPulse.com.au  Fact Sheet for Healthcare Providers:  IncredibleEmployment.be  This test is no  t yet approved or cleared by the [place] and  has been authorized for detection and/or diagnosis of SARS-CoV-2 by FDA under an Emergency Use Authorization (EUA). This EUA will remain  in effect (meaning this test can be used) for the duration of the COVID-19 declaration under Section 564(b)(1) of the Act, 21 U.S.C.section 360bbb-3(b)(1), unless the authorization is terminated  or revoked sooner.       Influenza A by PCR 06/07/2022 NEGATIVE  NEGATIVE Final   Influenza B by PCR 06/07/2022 NEGATIVE  NEGATIVE Final   Comment: (NOTE) The Xpert Xpress SARS-CoV-2/FLU/RSV plus assay is intended as an aid in the diagnosis of influenza from Nasopharyngeal swab specimens and should not be used as a sole basis for treatment. Nasal washings and aspirates are unacceptable for Xpert Xpress SARS-CoV-2/FLU/RSV testing.  Fact Sheet for Patients: BloggerCourse.com  Fact Sheet for Healthcare Providers: SeriousBroker.it  This test is not yet approved or cleared by the [place] FDA and has been authorized for detection and/or diagnosis of SARS-CoV-2 by FDA under an Emergency Use  Authorization (EUA). This EUA will remain in effect (meaning this test can be used) for the duration of the COVID-19 declaration under Section 564(b)(1) of the Act, 21 U.S.C. section 360bbb-3(b)(1), unless the authorization is terminated or revoked.  Performed at [facility], [address] [place], [place] [address]    WBC 06/07/2022 5.7  4.0 - 10.5 K/uL Final   RBC 06/07/2022 4.39  3.87 - 5.11 MIL/uL Final   Hemoglobin 06/07/2022 13.2  12.0 - 15.0 g/dL Final   HCT 54/00/8676 40.4  36.0 - 46.0 % Final   MCV 06/07/2022 92.0  80.0 - 100.0 fL Final   MCH 06/07/2022 30.1  26.0 - 34.0 pg Final   MCHC 06/07/2022 32.7  30.0 - 36.0 g/dL Final   RDW 19/50/9326 12.4  11.5 - 15.5 % Final   Platelets 06/07/2022 308  150 - 400 K/uL Final   nRBC 06/07/2022 0.0  0.0 - 0.2 % Final   Neutrophils Relative % 06/07/2022 42  % Final   Neutro Abs 06/07/2022 2.4  1.7 - 7.7 K/uL Final   Lymphocytes Relative 06/07/2022 54  % Final   Lymphs Abs 06/07/2022 3.1  0.7 - 4.0 K/uL Final   Monocytes Relative 06/07/2022 4  % Final   Monocytes Absolute 06/07/2022 0.2  0.1 - 1.0 K/uL Final   Eosinophils Relative 06/07/2022 0  % Final   Eosinophils Absolute 06/07/2022 0.0  0.0 - 0.5 K/uL Final   Basophils Relative 06/07/2022 0  % Final   Basophils Absolute 06/07/2022 0.0  0.0 - 0.1 K/uL Final   Immature Granulocytes 06/07/2022 0  % Final   Abs Immature Granulocytes 06/07/2022 0.01  0.00 - 0.07 K/uL Final   Performed at [facility], [address], [place], [place] [address]   Sodium 06/07/2022 139  135 - 145 mmol/L Final   Potassium 06/07/2022 4.0  3.5 - 5.1 mmol/L Final   Chloride 06/07/2022 104  98 - 111 mmol/L Final   CO2 06/07/2022 28  22 - 32 mmol/L Final   Glucose, Bld 06/07/2022 104 (H)  70 - 99 mg/dL Final   Glucose reference range applies only to samples taken after fasting for at least 8 hours.   BUN 06/07/2022 8  6 - 20 mg/dL Final   Creatinine, Ser 06/07/2022 0.84  0.44 - 1.00 mg/dL  Final   Calcium 80/99/8338 9.1  8.9 - 10.3 mg/dL Final  Total Protein 06/07/2022 6.9  6.5 - 8.1 g/dL Final   Albumin 06/07/2022 3.7  3.5 - 5.0 g/dL Final   AST 06/07/2022 19  15 - 41 U/L Final   ALT 06/07/2022 22  0 - 44 U/L Final   Alkaline Phosphatase 06/07/2022 54  38 - 126 U/L Final   Total Bilirubin 06/07/2022 0.4  0.3 - 1.2 mg/dL Final   GFR, Estimated 06/07/2022 >60  >60 mL/min Final   Comment: (NOTE) Calculated using the CKD-EPI Creatinine Equation (2021)    Anion gap 06/07/2022 7  5 - 15 Final   Performed at [facility], [place] [address] [place], [place] [address]   Hgb A1c MFr Bld 06/07/2022 5.0  4.8 - 5.6 % Final   Comment: (NOTE) Pre diabetes:          5.7%-6.4%  Diabetes:              >6.4%  Glycemic control for   <7.0% adults with diabetes    Mean Plasma Glucose 06/07/2022 96.8  mg/dL Final   Performed at [facility], [place] [address], [place], [place] [address]   TSH 06/07/2022 1.620  0.350 - 4.500 uIU/mL Final   Comment: Performed by a 3rd Generation assay with a functional sensitivity of <=0.01 uIU/mL. Performed at [facility], [place] [address] [place], [place] [address]    RPR Ser Ql 06/07/2022 NON REACTIVE  NON REACTIVE Final   Performed at [facility], [place] [address], [place], [place] [address]   Color, Urine 06/07/2022 YELLOW  YELLOW Final   APPearance 06/07/2022 HAZY (A)  CLEAR Final   Specific Gravity, Urine 06/07/2022 1.018  1.005 - 1.030 Final   pH 06/07/2022 7.0  5.0 - 8.0 Final   Glucose, UA 06/07/2022 NEGATIVE  NEGATIVE mg/dL Final   Hgb urine dipstick 06/07/2022 NEGATIVE  NEGATIVE Final   Bilirubin Urine 06/07/2022 NEGATIVE  NEGATIVE Final   Ketones, ur 06/07/2022 NEGATIVE  NEGATIVE mg/dL Final   Protein, ur 06/07/2022 NEGATIVE  NEGATIVE mg/dL Final   Nitrite 06/07/2022 NEGATIVE  NEGATIVE Final   Leukocytes,Ua 06/07/2022 NEGATIVE  NEGATIVE Final   Performed at [facility], [place] [address] [place], [place] [address]    Cholesterol 06/07/2022 164  0 - 200 mg/dL Final   Triglycerides 06/07/2022 158 (H)  <150 mg/dL Final   HDL 06/07/2022 44  >40 mg/dL Final   Total CHOL/HDL Ratio 06/07/2022 3.7  RATIO Final   VLDL 06/07/2022 32  0 - 40 mg/dL Final   LDL Cholesterol 06/07/2022 88  0 - 99 mg/dL Final   Comment:        Total Cholesterol/HDL:CHD Risk Coronary Heart Disease Risk Table                     Men   Women  1/2 Average Risk   3.4   3.3  Average Risk       5.0   4.4  2 X Average Risk   9.6   7.1  3 X Average Risk  23.4   11.0        Use the calculated Patient Ratio above and the CHD Risk Table to determine the patient's CHD Risk.        ATP III CLASSIFICATION (LDL):  <100     mg/dL   Optimal  100-129  mg/dL   Near or Above  Optimal  130-159  mg/dL   Borderline  735-329  mg/dL   High  >924     mg/dL   Very High Performed at [facility], [address], [place], [place] [address]    HIV Screen 4th Generation wRfx 06/07/2022 Non Reactive  Non Reactive Final   Performed at [facility], [address], [place], [place] [address]   SARSCOV2ONAVIRUS 2 AG 06/07/2022 NEGATIVE  NEGATIVE Final   Comment: (NOTE) SARS-CoV-2 antigen NOT DETECTED.   Negative results are presumptive.  Negative results do not preclude SARS-CoV-2 infection and should not be used as the sole basis for treatment or other patient management decisions, including infection  control decisions, particularly in the presence of clinical signs and  symptoms consistent with COVID-19, or in those who have been in contact with the virus.  Negative results must be combined with clinical observations, patient history, and epidemiological information. The expected result is Negative.  Fact Sheet for Patients: https://www.jennings-kim.com/  Fact Sheet for Healthcare Providers: https://alexander-rogers.biz/  This test is not yet approved or cleared by the [place] FDA and   has been authorized for detection and/or diagnosis of SARS-CoV-2 by FDA under an Emergency Use Authorization (EUA).  This EUA will remain in effect (meaning this test can be used) for the duration of  the COV                          ID-19 declaration under Section 564(b)(1) of the Act, 21 U.S.C. section 360bbb-3(b)(1), unless the authorization is terminated or revoked sooner.     POC Amphetamine UR 06/07/2022 None Detected  NONE DETECTED (Cut Off Level 1000 ng/mL) Final   POC Secobarbital (BAR) 06/07/2022 None Detected  NONE DETECTED (Cut Off Level 300 ng/mL) Final   POC Buprenorphine (BUP) 06/07/2022 None Detected  NONE DETECTED (Cut Off Level 10 ng/mL) Final   POC Oxazepam (BZO) 06/07/2022 None Detected  NONE DETECTED (Cut Off Level 300 ng/mL) Final   POC Cocaine UR 06/07/2022 None Detected  NONE DETECTED (Cut Off Level 300 ng/mL) Final   POC Methamphetamine UR 06/07/2022 None Detected  NONE DETECTED (Cut Off Level 1000 ng/mL) Final   POC Morphine 06/07/2022 None Detected  NONE DETECTED (Cut Off Level 300 ng/mL) Final   POC Methadone UR 06/07/2022 None Detected  NONE DETECTED (Cut Off Level 300 ng/mL) Final   POC Oxycodone UR 06/07/2022 None Detected  NONE DETECTED (Cut Off Level 100 ng/mL) Final   POC Marijuana UR 06/07/2022 None Detected  NONE DETECTED (Cut Off Level 50 ng/mL) Final   Preg Test, Ur 06/08/2022 NEGATIVE  NEGATIVE Final   Comment:        THE SENSITIVITY OF THIS METHODOLOGY IS >20 mIU/mL. Performed at [facility], [address], [place], [place] [address]    Preg Test, Ur 06/08/2022 NEGATIVE  NEGATIVE Final   Comment:        THE SENSITIVITY OF THIS METHODOLOGY IS >24 mIU/mL     Blood Alcohol level:  Lab Results  Component Value Date   ETH <10 07/04/2022   ETH <10 02/07/2022    Metabolic Disorder Labs: Lab Results  Component Value Date   HGBA1C 5.7 (H) 10/15/2022   MPG 117 10/15/2022   MPG 96.8 06/07/2022   No results found for:  "PROLACTIN" Lab Results  Component Value Date   CHOL 181 07/25/2022   TRIG 118 07/25/2022   HDL 45 07/25/2022  CHOLHDL 4.0 07/25/2022   VLDL 24 07/25/2022   LDLCALC 112 (H) 07/25/2022   LDLCALC 88 06/07/2022    Therapeutic Lab Levels: No results found for: "LITHIUM" No results found for: "VALPROATE" No results found for: "CBMZ"  Physical Findings   GAD-7    Flowsheet Row [facility]  Total GAD-7 Score 17      PHQ2-9    Flowsheet Row [facility]  PHQ-2 Total Score 0 1 2  PHQ-9 Total Score -- 2 8      Flowsheet Row [facility]20[facility] Most recent reading at 10/27/2022  9:31 AM ED [facility] Most recent reading at 10/07/2022 12:18 PM ED [facility]20[facility] Most recent reading at 10/06/2022  9:38 AM  C-SSRS RISK CATEGORY No Risk No Risk No Risk        Musculoskeletal  Strength & Muscle Tone: within normal limits Gait & Station: normal Patient leans: N/A  Psychiatric Specialty Exam  Presentation  General Appearance:  Appropriate for Environment  Eye Contact: Fair  Speech: Clear and Coherent  Speech Volume: Normal  Handedness: Right   Mood and Affect  Mood: Euthymic  Affect: Appropriate   Thought Process  Thought Processes: Coherent  Descriptions of Associations:Intact  Orientation:Full (Time, Place and Person)  Thought Content:Logical  Diagnosis of Schizophrenia or Schizoaffective disorder in past: No    Hallucinations:Hallucinations: None  Ideas of Reference:None  Suicidal Thoughts:Suicidal Thoughts: No  Homicidal Thoughts:Homicidal Thoughts:  No   Sensorium  Memory: Immediate Fair; Recent Fair; Remote Fair  Judgment: Intact  Insight: Fair   Chartered certified accountantxecutive Functions  Concentration: Fair  Attention Span: Fair  Recall: FiservFair  Fund of Knowledge: Fair  Language: Fair   Psychomotor Activity  Psychomotor Activity: Psychomotor Activity: Normal   Assets  Assets: Communication Skills; Desire for Improvement; Physical Health   Sleep  Sleep: Sleep: Fair Number of Hours of Sleep: 8   Nutritional Assessment (For [facility] and [facility] admissions only) Has the patient had a weight loss or gain of 10 pounds or more in the last 3 months?: No Has the patient had a decrease in food intake/or appetite?: No Does the patient have dental problems?: No Does the patient have eating habits or behaviors that may be indicators of an eating disorder including binging or inducing vomiting?: No Has the patient recently lost weight without trying?: 0 Has the patient been eating poorly because of a decreased appetite?: 0 Malnutrition Screening Tool Score: 0    Physical Exam  Physical Exam HENT:     Head: Normocephalic.     Nose: Nose normal.  Eyes:     Conjunctiva/sclera: Conjunctivae normal.  Cardiovascular:     Rate and Rhythm: Normal rate.  Pulmonary:     Effort: Pulmonary effort is normal.  Musculoskeletal:        General: Normal range of motion.     Cervical back: Normal range of motion.  Neurological:     Mental Status: She is alert and oriented to person, place, and time.    Review of Systems  Constitutional: Negative.   HENT: Negative.    Eyes: Negative.   Respiratory: Negative.    Cardiovascular: Negative.   Genitourinary: Negative.  Musculoskeletal:  Positive for back pain.  Neurological: Negative.   Endo/Heme/Allergies: Negative.    Blood pressure 107/69, pulse 87, temperature 98.4 F (36.9 C), resp. rate 18, height 5\' 1"  (1.549 m), weight 198 lb (89.8 kg), SpO2 98 %. Body mass index is 37.41  kg/m.  Treatment Plan Summary: Patient remains psychiatrically cleared. Patient awaiting group home placement. will move into [facility] on November 04, 2022 at 10am.  Tests administered: Wechsler Adult Intelligence Scale-4 (see November 06, 2022, PhD consult note on 08/19/2022 for full info) [place]-3 Adaptive Behavior Scales Assessment completed on 09/13/2022 administered by 14/10/2021, MD   Labs reviewed, no new labs pending.   Vital signs are stable.  No medication changes on 10/27/22.  ARIPiprazole ER  400 mg Intramuscular Q28 days   fluticasone  1 spray Each Nare QHS   ketoconazole   Topical BID   levothyroxine  100 mcg Oral Once   levothyroxine  100 mcg Oral Q0600   lidocaine  1 patch Transdermal Q24H   loratadine  10 mg Oral Daily   metFORMIN  500 mg Oral Q breakfast   OXcarbazepine  300 mg Oral BID   pantoprazole  40 mg Oral Daily   polyethylene glycol  17 g Oral Daily   QUEtiapine  400 mg Oral BID   sertraline  150 mg Oral Daily   sodium chloride  1 spray Each Nare Daily   traZODone  100 mg Oral QHS   valACYclovir  500 mg Oral Daily    10/29/22 L, NP 10/27/2022 9:39 AM

## 2022-10-27 NOTE — ED Notes (Signed)
Patient is watching tv. Alert and oriented. Will continue to monitor for safety. 

## 2022-10-27 NOTE — ED Notes (Signed)
Patient observed/assessed in bed/chair resting quietly appearing in no distress and verbalizing no complaints at this time. Will continue to monitor.  

## 2022-10-27 NOTE — ED Notes (Signed)
Patient alert and oriented x 3. Denies SI/HI/AVH. Denies intent or plan to harm self or others. Routine conducted according to faculty protocol. Encourage patient to notify staff with any needs or concerns. Patient verbalized agreement and understanding. Will continue to monitor for safety. 

## 2022-10-28 DIAGNOSIS — F333 Major depressive disorder, recurrent, severe with psychotic symptoms: Secondary | ICD-10-CM | POA: Diagnosis not present

## 2022-10-28 DIAGNOSIS — F419 Anxiety disorder, unspecified: Secondary | ICD-10-CM | POA: Diagnosis not present

## 2022-10-28 DIAGNOSIS — E118 Type 2 diabetes mellitus with unspecified complications: Secondary | ICD-10-CM | POA: Diagnosis not present

## 2022-10-28 DIAGNOSIS — E039 Hypothyroidism, unspecified: Secondary | ICD-10-CM | POA: Diagnosis not present

## 2022-10-28 DIAGNOSIS — N9489 Other specified conditions associated with female genital organs and menstrual cycle: Secondary | ICD-10-CM | POA: Diagnosis not present

## 2022-10-28 DIAGNOSIS — N390 Urinary tract infection, site not specified: Secondary | ICD-10-CM | POA: Diagnosis not present

## 2022-10-28 DIAGNOSIS — F17200 Nicotine dependence, unspecified, uncomplicated: Secondary | ICD-10-CM | POA: Diagnosis not present

## 2022-10-28 DIAGNOSIS — Z1152 Encounter for screening for COVID-19: Secondary | ICD-10-CM | POA: Diagnosis not present

## 2022-10-28 DIAGNOSIS — F209 Schizophrenia, unspecified: Secondary | ICD-10-CM | POA: Diagnosis not present

## 2022-10-28 NOTE — ED Notes (Signed)
Pt reports increase in AH and states she has a headache.  Prn vistaril and tylenol provided.  Message sent to provider to see if she could receive medication for the [facility].

## 2022-10-28 NOTE — ED Notes (Signed)
Pt sleeping@this time. Breathing even and unlabored. Will continue to monitor for safety 

## 2022-10-28 NOTE — ED Notes (Signed)
Pt resting quietly, breathing is even and unlabored.  Pt denies SI, HI, pain and AVH.  Will continue to monitor for safety.  

## 2022-10-28 NOTE — ED Provider Notes (Signed)
Behavioral Health Progress Note  Date and Time: 10/28/2022 3:58 PM Name: Licia Scrivano MRN:  [id number]  Subjective:  Nicole [name] 32 y.o., female patient with schizophrenia, MDD with psychosis,  presented to [facility], voluntarily, as a walk-in accompanied by [employer] after a disagreement and verbal altercation with personnel at the group home.  She has been dismissed previously from other group homes due to threatening behaviors, and suicidal ideation and the one which she was residing in.  Patient was admitted to continuous assessment unit on 07/25/2022 as she also endorsed passive SI at that time along with symptoms of visual hallucination. Patient was subsequently psychiatrically cleared during her admission to the continuous assessment unit.  However patient remains here at [facility] until adequate- residential placement can be obtained.     Legal Guardian: [name]) transitioning to be [name]) Point of contact: [name])   Tests administered: Wechsler Adult Intelligence Scale-4 (see Eloise Harman, PhD consult note on 08/19/2022 for full info) [place]-3 Adaptive Behavior Scales Assessment completed on 09/13/2022 administered by Rosezetta Schlatter, MD   Objective: Patient is seen face-to-face on the unit by this provider.  Patient receptive, and pleasant upon approach. She is sitting in her bed, playing puzzles.  Alert and oriented. Thought process is clear and coherent. Patient is appropriately groomed and appears to be well nourished. She participated in the assessment with full attention. Patient denies SI/HI. Vital sign WNL. Patient reports that she slept well "a lot" and her appetite has been good. She reports that she has been hearing voices in her sleep. The voices tell her to "bang my head" but she does not do it. She reports that she talks to her father everyday "but he doesn't like it". Patient reports she feels more comfortable talking to her father than her  mother. She has been talking to her child who currently stays with patient's parents.  Patient is taking her medications as prescribed and has no concerns about. She reports that she will be discharged on 1/22 to a new group home. Patient expressed no major concerns.      Diagnosis:  Final diagnoses:  Tobacco use disorder  Schizophrenia, unspecified type ([place])  MDD (major depressive disorder), recurrent episode, moderate ([facility])  Hypothyroidism, unspecified type    Total Time spent with patient: 20 minutes  Past Psychiatric History: Shcizophrenia, Anxiety, Depression Past Medical History:  Past Medical History:  Diagnosis Date   Anxiety    Depression    Hypothyroidism 08/07/2022   Tobacco use disorder 08/07/2022    Past Surgical History:  Procedure Laterality Date   WISDOM TOOTH EXTRACTION Bilateral 2020   Family History:  Family History  Problem Relation Age of Onset   Hypertension Father    Diabetes Father    Family Psychiatric  History: Not on file Social History:  Social History   Substance and Sexual Activity  Alcohol Use Never     Social History   Substance and Sexual Activity  Drug Use Never    Social History   Socioeconomic History   Marital status: Single    Spouse name: Not on file   Number of children: Not on file   Years of education: Not on file   Highest education level: Not on file  Occupational History   Not on file  Tobacco Use   Smoking status: Every Day    Types: Cigarettes   Smokeless tobacco: Never  Vaping Use   Vaping Use: Some days  Substance and Sexual Activity   Alcohol use: Never   Drug use: Never   Sexual activity: Yes    Partners: Female    Birth control/protection: Implant  Other Topics Concern   Not on file  Social History Narrative   Not on file   Social Determinants of Health   Financial Resource Strain: Not on file  Food Insecurity: Not on file  Transportation Needs: Not on file  Physical Activity: Not on  file  Stress: Not on file  Social Connections: Not on file   SDOH:  SDOH Screenings   Depression (PHQ2-9): Low Risk  (10/26/2022)  Tobacco Use: High Risk (10/21/2022)   Additional Social History:                         Sleep: Good  Appetite:  Good  Current Medications:  Current Facility-Administered Medications  Medication Dose Route Frequency Provider Last Rate Last Admin   acetaminophen (TYLENOL) tablet 650 mg  650 mg Oral Q6H PRN Haynes Kerns, NP   650 mg at 10/28/22 1054   alum & mag hydroxide-simeth (MAALOX/MYLANTA) 200-200-20 MG/5ML suspension 30 mL  30 mL Oral Q4H PRN Haynes Kerns, NP   30 mL at 10/26/22 1755   ARIPiprazole ER (ABILIFY MAINTENA) 400 MG prefilled syringe 400 mg  400 mg Intramuscular Q28 days Merrily Brittle, DO   400 mg at 10/25/22 0920   cetaphil lotion   Topical PRN Merrily Brittle, DO       fluticasone [facility]) 50 MCG/ACT nasal spray 1 spray  1 spray Each Nare QHS Merrily Brittle, DO   1 spray at 10/27/22 2129   hydrOXYzine (ATARAX) tablet 25 mg  25 mg Oral TID PRN Christene Slates, MD   25 mg at 10/28/22 1054   ketoconazole (NIZORAL) 2 % cream   Topical BID Merrily Brittle, DO   Given at 10/28/22 0935   levothyroxine (SYNTHROID) tablet 100 mcg  100 mcg Oral Once Kire Ferg M, NP       levothyroxine (SYNTHROID) tablet 100 mcg  100 mcg Oral Q0600 Haynes Kerns, NP   100 mcg at 10/28/22 0605   lidocaine (LIDODERM) 5 % 1 patch  1 patch Transdermal Q24H Merrily Brittle, DO   1 patch at 10/28/22 0934   loratadine (CLARITIN) tablet 10 mg  10 mg Oral Daily Carrion-Carrero, Caryl Ada, MD   10 mg at 10/28/22 0936   magnesium hydroxide (MILK OF MAGNESIA) suspension 30 mL  30 mL Oral Daily PRN Haynes Kerns, NP   30 mL at 10/12/22 2145   metFORMIN (GLUCOPHAGE) tablet 500 mg  500 mg Oral Q breakfast Carrion-Carrero, Margely, MD   500 mg at 10/28/22 0758   nicotine (NICODERM CQ - dosed in mg/24 hours) patch 14 mg  14 mg  Transdermal Daily PRN Carrion-Carrero, Caryl Ada, MD       ondansetron (ZOFRAN-ODT) disintegrating tablet 4 mg  4 mg Oral Q8H PRN Carrion-Carrero, Margely, MD   4 mg at 10/22/22 1806   Oxcarbazepine (TRILEPTAL) tablet 300 mg  300 mg Oral BID Maple Hudson, Naylee Frankowski M, NP   300 mg at 10/28/22 0936   pantoprazole (PROTONIX) EC tablet 40 mg  40 mg Oral Daily Carrion-Carrero, Caryl Ada, MD   40 mg at 10/28/22 0936   polyethylene glycol (MIRALAX / GLYCOLAX) packet 17 g  17 g Oral Daily Merrily Brittle, DO   17 g at 10/28/22 0934   QUEtiapine (SEROQUEL) tablet 400 mg  400 mg Oral  BID Ronelle Nigh M, NP   400 mg at 10/28/22 [id number]   sertraline (ZOLOFT) tablet 150 mg  150 mg Oral Daily Carrion-Carrero, Caryl Ada, MD   150 mg at 10/28/22 0936   sodium chloride (OCEAN) 0.65 % nasal spray 1 spray  1 spray Each Nare Daily Merrily Brittle, DO   1 spray at 10/28/22 0934   traZODone (DESYREL) tablet 100 mg  100 mg Oral QHS Ronelle Nigh M, NP   100 mg at 10/27/22 2126   traZODone (DESYREL) tablet 50 mg  50 mg Oral QHS PRN Haynes Kerns, NP   50 mg at 10/26/22 2113   valACYclovir (VALTREX) tablet 500 mg  500 mg Oral Daily Carrion-Carrero, Caryl Ada, MD   500 mg at 10/28/22 0936   zinc oxide (BALMEX) 11.3 % cream   Topical PRN Merrily Brittle, DO       Current Outpatient Medications  Medication Sig Dispense Refill   ABILIFY MAINTENA 400 MG PRSY prefilled syringe 400 mg every 28 (twenty-eight) days.     cetirizine (ZYRTEC) 10 MG tablet Take 10 mg by mouth daily.     cyclobenzaprine (FLEXERIL) 10 MG tablet Take 1 tablet (10 mg total) by mouth 2 (two) times daily as needed for muscle spasms. 20 tablet 0   fluticasone (FLONASE) 50 MCG/ACT nasal spray Place 1 spray into both nostrils daily.     meloxicam (MOBIC) 15 MG tablet Take 15 mg by mouth daily.     nitrofurantoin, macrocrystal-monohydrate, (MACROBID) 100 MG capsule Take 1 capsule (100 mg total) by mouth 2 (two) times daily. 10 capsule 0   ondansetron  (ZOFRAN-ODT) 4 MG disintegrating tablet Take 1 tablet (4 mg total) by mouth every 8 (eight) hours as needed for nausea or vomiting. 20 tablet 0   Oxcarbazepine (TRILEPTAL) 300 MG tablet Take 1 tablet (300 mg total) by mouth 2 (two) times daily. 60 tablet 0   QUEtiapine (SEROQUEL) 400 MG tablet Take 1 tablet (400 mg total) by mouth 2 (two) times daily. 60 tablet 0   sertraline (ZOLOFT) 50 MG tablet Take 3 tablets (150 mg total) by mouth in the morning. 90 tablet 0   traZODone (DESYREL) 100 MG tablet Take 1 tablet (100 mg total) by mouth at bedtime. 30 tablet 0   valACYclovir (VALTREX) 500 MG tablet Take 500 mg by mouth daily.      Labs  Lab Results:  Admission on 10/07/2022  Component Date Value Ref Range Status   Hgb A1c MFr Bld 10/15/2022 5.7 (H)  4.8 - 5.6 % Final   Comment: (NOTE)         Prediabetes: 5.7 - 6.4         Diabetes: >6.4         Glycemic control for adults with diabetes: <7.0    Mean Plasma Glucose 10/15/2022 117  mg/dL Final   Comment: (NOTE) Performed At: [facility] [place], [place] [id number] Rush Farmer MD [id number]   Admission on 10/07/2022, Discharged on 10/07/2022  Component Date Value Ref Range Status   Color, Urine 10/07/2022 YELLOW  YELLOW Final   APPearance 10/07/2022 HAZY (A)  CLEAR Final   Specific Gravity, Urine 10/07/2022 1.011  1.005 - 1.030 Final   pH 10/07/2022 7.0  5.0 - 8.0 Final   Glucose, UA 10/07/2022 NEGATIVE  NEGATIVE mg/dL Final   Hgb urine dipstick 10/07/2022 SMALL (A)  NEGATIVE Final   Bilirubin Urine 10/07/2022 NEGATIVE  NEGATIVE Final   Ketones, ur 10/07/2022 NEGATIVE  NEGATIVE  mg/dL Final   Protein, ur 41/32/4401 NEGATIVE  NEGATIVE mg/dL Final   Nitrite 02/72/5366 NEGATIVE  NEGATIVE Final   Leukocytes,Ua 10/07/2022 LARGE (A)  NEGATIVE Final   RBC / HPF 10/07/2022 0-5  0 - 5 RBC/hpf Final   WBC, UA 10/07/2022 0-5  0 - 5 WBC/hpf Final   Bacteria, UA 10/07/2022 FEW (A)  NONE SEEN Final   Squamous  Epithelial / HPF 10/07/2022 11-20  0 - 5 Final   Performed at [facility], [address], [place], [place] [address]   I-stat hCG, quantitative 10/07/2022 <5.0  <5 mIU/mL Final   Comment 3 10/07/2022          Final   Comment:   GEST. AGE      CONC.  (mIU/mL)   <=1 WEEK        5 - 50     2 WEEKS       50 - 500     3 WEEKS       100 - 10,000     4 WEEKS     1,000 - 30,000        FEMALE AND NON-PREGNANT FEMALE:     LESS THAN 5 mIU/mL    Sodium 10/07/2022 138  135 - 145 mmol/L Final   Potassium 10/07/2022 4.3  3.5 - 5.1 mmol/L Final   Chloride 10/07/2022 104  98 - 111 mmol/L Final   CO2 10/07/2022 28  22 - 32 mmol/L Final   Glucose, Bld 10/07/2022 90  70 - 99 mg/dL Final   Glucose reference range applies only to samples taken after fasting for at least 8 hours.   BUN 10/07/2022 8  6 - 20 mg/dL Final   Creatinine, Ser 10/07/2022 0.96  0.44 - 1.00 mg/dL Final   Calcium 74/25/9563 9.1  8.9 - 10.3 mg/dL Final   GFR, Estimated 10/07/2022 >60  >60 mL/min Final   Comment: (NOTE) Calculated using the CKD-EPI Creatinine Equation (2021)    Anion gap 10/07/2022 6  5 - 15 Final   Performed at [facility], [address], [place], [place] [address]   WBC 10/07/2022 5.8  4.0 - 10.5 K/uL Final   RBC 10/07/2022 4.36  3.87 - 5.11 MIL/uL Final   Hemoglobin 10/07/2022 12.9  12.0 - 15.0 g/dL Final   HCT 33/29/5188 40.0  36.0 - 46.0 % Final   MCV 10/07/2022 91.7  80.0 - 100.0 fL Final   MCH 10/07/2022 29.6  26.0 - 34.0 pg Final   MCHC 10/07/2022 32.3  30.0 - 36.0 g/dL Final   RDW 41/66/0630 12.4  11.5 - 15.5 % Final   Platelets 10/07/2022 295  150 - 400 K/uL Final   nRBC 10/07/2022 0.0  0.0 - 0.2 % Final   Performed at [facility], [address], [place], [place] [address]   SARS Coronavirus 2 by RT PCR 10/07/2022 NEGATIVE  NEGATIVE Final   Comment: (NOTE) SARS-CoV-2 target nucleic acids are NOT DETECTED.  The SARS-CoV-2 RNA is generally detectable in upper  respiratory specimens during the acute phase of infection. The lowest concentration of SARS-CoV-2 viral copies this assay can detect is 138 copies/mL. A negative result does not preclude SARS-Cov-2 infection and should not be used as the sole basis for treatment or other patient management decisions. A negative result may occur with  improper specimen collection/handling, submission of specimen other than nasopharyngeal swab, presence of viral mutation(s) within the areas targeted by this assay,  and inadequate number of viral copies(<138 copies/mL). A negative result must be combined with clinical observations, patient history, and epidemiological information. The expected result is Negative.  Fact Sheet for Patients:  EntrepreneurPulse.com.au  Fact Sheet for Healthcare Providers:  IncredibleEmployment.be  This test is no                          t yet approved or cleared by the [place] FDA and  has been authorized for detection and/or diagnosis of SARS-CoV-2 by FDA under an Emergency Use Authorization (EUA). This EUA will remain  in effect (meaning this test can be used) for the duration of the COVID-19 declaration under Section 564(b)(1) of the Act, 21 U.S.C.section 360bbb-3(b)(1), unless the authorization is terminated  or revoked sooner.       Influenza A by PCR 10/07/2022 NEGATIVE  NEGATIVE Final   Influenza B by PCR 10/07/2022 NEGATIVE  NEGATIVE Final   Comment: (NOTE) The Xpert Xpress SARS-CoV-2/FLU/RSV plus assay is intended as an aid in the diagnosis of influenza from Nasopharyngeal swab specimens and should not be used as a sole basis for treatment. Nasal washings and aspirates are unacceptable for Xpert Xpress SARS-CoV-2/FLU/RSV testing.  Fact Sheet for Patients: EntrepreneurPulse.com.au  Fact Sheet for Healthcare Providers: IncredibleEmployment.be  This test is not yet approved or  cleared by the [place] FDA and has been authorized for detection and/or diagnosis of SARS-CoV-2 by FDA under an Emergency Use Authorization (EUA). This EUA will remain in effect (meaning this test can be used) for the duration of the COVID-19 declaration under Section 564(b)(1) of the Act, 21 U.S.C. section 360bbb-3(b)(1), unless the authorization is terminated or revoked.     Resp Syncytial Virus by PCR 10/07/2022 NEGATIVE  NEGATIVE Final   Comment: (NOTE) Fact Sheet for Patients: EntrepreneurPulse.com.au  Fact Sheet for Healthcare Providers: IncredibleEmployment.be  This test is not yet approved or cleared by the [place] FDA and has been authorized for detection and/or diagnosis of SARS-CoV-2 by FDA under an Emergency Use Authorization (EUA). This EUA will remain in effect (meaning this test can be used) for the duration of the COVID-19 declaration under Section 564(b)(1) of the Act, 21 U.S.C. section 360bbb-3(b)(1), unless the authorization is terminated or revoked.  Performed at [facility], [place] [address], [place], [place] [address]    Specimen Description 10/07/2022 URINE, CLEAN CATCH   Final   Special Requests 10/07/2022    Final                   Value:NONE Performed at [facility], [place] [address], [place], [place] [address]    Culture 10/07/2022 MULTIPLE SPECIES PRESENT, SUGGEST RECOLLECTION (A)   Final   Report Status 10/07/2022 10/08/2022 FINAL   Final  Admission on 07/25/2022, Discharged on 10/07/2022  Component Date Value Ref Range Status   SARS Coronavirus 2 by RT PCR 07/25/2022 NEGATIVE  NEGATIVE Final   Comment: (NOTE) SARS-CoV-2 target nucleic acids are NOT DETECTED.  The SARS-CoV-2 RNA is generally detectable in upper respiratory specimens during the acute phase of infection. The lowest concentration of SARS-CoV-2 viral copies this assay can detect is 138 copies/mL. A negative result does not  preclude SARS-Cov-2 infection and should not be used as the sole basis for treatment or other patient management decisions. A negative result may occur with  improper specimen collection/handling, submission of specimen other than nasopharyngeal swab, presence of viral mutation(s) within the areas targeted  by this assay, and inadequate number of viral copies(<138 copies/mL). A negative result must be combined with clinical observations, patient history, and epidemiological information. The expected result is Negative.  Fact Sheet for Patients:  BloggerCourse.comhttps://www.fda.gov/media/152166/download  Fact Sheet for Healthcare Providers:  SeriousBroker.ithttps://www.fda.gov/media/152162/download  This test is no                          t yet approved or cleared by the [place] FDA and  has been authorized for detection and/or diagnosis of SARS-CoV-2 by FDA under an Emergency Use Authorization (EUA). This EUA will remain  in effect (meaning this test can be used) for the duration of the COVID-19 declaration under Section 564(b)(1) of the Act, 21 U.S.C.section 360bbb-3(b)(1), unless the authorization is terminated  or revoked sooner.       Influenza A by PCR 07/25/2022 NEGATIVE  NEGATIVE Final   Influenza B by PCR 07/25/2022 NEGATIVE  NEGATIVE Final   Comment: (NOTE) The Xpert Xpress SARS-CoV-2/FLU/RSV plus assay is intended as an aid in the diagnosis of influenza from Nasopharyngeal swab specimens and should not be used as a sole basis for treatment. Nasal washings and aspirates are unacceptable for Xpert Xpress SARS-CoV-2/FLU/RSV testing.  Fact Sheet for Patients: BloggerCourse.comhttps://www.fda.gov/media/152166/download  Fact Sheet for Healthcare Providers: SeriousBroker.ithttps://www.fda.gov/media/152162/download  This test is not yet approved or cleared by the [place] FDA and has been authorized for detection and/or diagnosis of SARS-CoV-2 by FDA under an Emergency Use Authorization (EUA). This EUA will remain in  effect (meaning this test can be used) for the duration of the COVID-19 declaration under Section 564(b)(1) of the Act, 21 U.S.C. section 360bbb-3(b)(1), unless the authorization is terminated or revoked.  Performed at [facility], [address] [place], [place] [address]    WBC 07/25/2022 8.3  4.0 - 10.5 K/uL Final   RBC 07/25/2022 4.44  3.87 - 5.11 MIL/uL Final   Hemoglobin 07/25/2022 13.7  12.0 - 15.0 g/dL Final   HCT 84/69/629510/09/2022 40.2  36.0 - 46.0 % Final   MCV 07/25/2022 90.5  80.0 - 100.0 fL Final   MCH 07/25/2022 30.9  26.0 - 34.0 pg Final   MCHC 07/25/2022 34.1  30.0 - 36.0 g/dL Final   RDW 28/41/324410/09/2022 12.2  11.5 - 15.5 % Final   Platelets 07/25/2022 248  150 - 400 K/uL Final   nRBC 07/25/2022 0.0  0.0 - 0.2 % Final   Neutrophils Relative % 07/25/2022 43  % Final   Neutro Abs 07/25/2022 3.6  1.7 - 7.7 K/uL Final   Lymphocytes Relative 07/25/2022 52  % Final   Lymphs Abs 07/25/2022 4.2 (H)  0.7 - 4.0 K/uL Final   Monocytes Relative 07/25/2022 5  % Final   Monocytes Absolute 07/25/2022 0.4  0.1 - 1.0 K/uL Final   Eosinophils Relative 07/25/2022 0  % Final   Eosinophils Absolute 07/25/2022 0.0  0.0 - 0.5 K/uL Final   Basophils Relative 07/25/2022 0  % Final   Basophils Absolute 07/25/2022 0.0  0.0 - 0.1 K/uL Final   Immature Granulocytes 07/25/2022 0  % Final   Abs Immature Granulocytes 07/25/2022 0.02  0.00 - 0.07 K/uL Final   Performed at [facility], [address] [place], [place] [address]   Sodium 07/25/2022 138  135 - 145 mmol/L Final   Potassium 07/25/2022 4.0  3.5 - 5.1 mmol/L Final   Chloride 07/25/2022 104  98 - 111 mmol/L Final   CO2  07/25/2022 29  22 - 32 mmol/L Final   Glucose, Bld 07/25/2022 83  70 - 99 mg/dL Final   Glucose reference range applies only to samples taken after fasting for at least 8 hours.   BUN 07/25/2022 11  6 - 20 mg/dL Final   Creatinine, Ser 07/25/2022 0.97  0.44 - 1.00 mg/dL Final   Calcium 16/10/960410/09/2022 9.2  8.9 - 10.3 mg/dL  Final   Total Protein 07/25/2022 7.0  6.5 - 8.1 g/dL Final   Albumin 54/09/811910/09/2022 3.8  3.5 - 5.0 g/dL Final   AST 14/78/295610/09/2022 18  15 - 41 U/L Final   ALT 07/25/2022 22  0 - 44 U/L Final   Alkaline Phosphatase 07/25/2022 64  38 - 126 U/L Final   Total Bilirubin 07/25/2022 0.2 (L)  0.3 - 1.2 mg/dL Final   GFR, Estimated 07/25/2022 >60  >60 mL/min Final   Comment: (NOTE) Calculated using the CKD-EPI Creatinine Equation (2021)    Anion gap 07/25/2022 5  5 - 15 Final   Performed at [facility], [address] [place], [place] [address]   POC Amphetamine UR 07/25/2022 None Detected  NONE DETECTED (Cut Off Level 1000 ng/mL) Preliminary   POC Secobarbital (BAR) 07/25/2022 None Detected  NONE DETECTED (Cut Off Level 300 ng/mL) Preliminary   POC Buprenorphine (BUP) 07/25/2022 None Detected  NONE DETECTED (Cut Off Level 10 ng/mL) Preliminary   POC Oxazepam (BZO) 07/25/2022 None Detected  NONE DETECTED (Cut Off Level 300 ng/mL) Preliminary   POC Cocaine UR 07/25/2022 None Detected  NONE DETECTED (Cut Off Level 300 ng/mL) Preliminary   POC Methamphetamine UR 07/25/2022 None Detected  NONE DETECTED (Cut Off Level 1000 ng/mL) Preliminary   POC Morphine 07/25/2022 None Detected  NONE DETECTED (Cut Off Level 300 ng/mL) Preliminary   POC Methadone UR 07/25/2022 None Detected  NONE DETECTED (Cut Off Level 300 ng/mL) Preliminary   POC Oxycodone UR 07/25/2022 None Detected  NONE DETECTED (Cut Off Level 100 ng/mL) Preliminary   POC Marijuana UR 07/25/2022 None Detected  NONE DETECTED (Cut Off Level 50 ng/mL) Preliminary   SARSCOV2ONAVIRUS 2 AG 07/25/2022 NEGATIVE  NEGATIVE Final   Comment: (NOTE) SARS-CoV-2 antigen NOT DETECTED.   Negative results are presumptive.  Negative results do not preclude SARS-CoV-2 infection and should not be used as the sole basis for treatment or other patient management decisions, including infection  control decisions, particularly in the presence of clinical signs and   symptoms consistent with COVID-19, or in those who have been in contact with the virus.  Negative results must be combined with clinical observations, patient history, and epidemiological information. The expected result is Negative.  Fact Sheet for Patients: https://www.jennings-kim.com/https://www.fda.gov/media/141569/download  Fact Sheet for Healthcare Providers: https://alexander-rogers.biz/https://www.fda.gov/media/141568/download  This test is not yet approved or cleared by the [place] FDA and  has been authorized for detection and/or diagnosis of SARS-CoV-2 by FDA under an Emergency Use Authorization (EUA).  This EUA will remain in effect (meaning this test can be used) for the duration of  the COV                          ID-19 declaration under Section 564(b)(1) of the Act, 21 U.S.C. section 360bbb-3(b)(1), unless the authorization is terminated or revoked sooner.     Cholesterol 07/25/2022 181  0 - 200 mg/dL Final   Triglycerides 65/78/469610/09/2022 118  <150 mg/dL Final   HDL 29/52/841310/09/2022 45  >40 mg/dL Final  Total CHOL/HDL Ratio 07/25/2022 4.0  RATIO Final   VLDL 07/25/2022 24  0 - 40 mg/dL Final   LDL Cholesterol 07/25/2022 112 (H)  0 - 99 mg/dL Final   Comment:        Total Cholesterol/HDL:CHD Risk Coronary Heart Disease Risk Table                     Men   Women  1/2 Average Risk   3.4   3.3  Average Risk       5.0   4.4  2 X Average Risk   9.6   7.1  3 X Average Risk  23.4   11.0        Use the calculated Patient Ratio above and the CHD Risk Table to determine the patient's CHD Risk.        ATP III CLASSIFICATION (LDL):  <100     mg/dL   Optimal  100-129  mg/dL   Near or Above                    Optimal  130-159  mg/dL   Borderline  160-189  mg/dL   High  >190     mg/dL   Very High Performed at [place] [address], [place], [place] [address]    TSH 07/25/2022 6.668 (H)  0.350 - 4.500 uIU/mL Final   Comment: Performed by a 3rd Generation assay with a functional sensitivity of <=0.01  uIU/mL. Performed at [facility], [place] [address], [place], [place] [address]    Glucose-Capillary 07/26/2022 104 (H)  70 - 99 mg/dL Final   Glucose reference range applies only to samples taken after fasting for at least 8 hours.   T3, Free 07/31/2022 2.3  2.0 - 4.4 pg/mL Final   Comment: (NOTE) Performed At: [facility] [place], [place] [id number] Rush Farmer MD [id number]    Free T4 07/31/2022 0.60 (L)  0.61 - 1.12 ng/dL Final   Comment: (NOTE) Biotin ingestion may interfere with free T4 tests. If the results are inconsistent with the TSH level, previous test results, or the clinical presentation, then consider biotin interference. If needed, order repeat testing after stopping biotin. Performed at [facility], [place] [address], [place], [place] [address]    Glucose-Capillary 08/29/2022 100 (H)  70 - 99 mg/dL Final   Glucose reference range applies only to samples taken after fasting for at least 8 hours.   TSH 09/05/2022 0.793  0.350 - 4.500 uIU/mL Final   Comment: Performed by a 3rd Generation assay with a functional sensitivity of <=0.01 uIU/mL. Performed at [facility], [place] [address], [place], [place] [address]    Free T4 09/05/2022 0.73  0.61 - 1.12 ng/dL Final   Comment: (NOTE) Biotin ingestion may interfere with free T4 tests. If the results are inconsistent with the TSH level, previous test results, or the clinical presentation, then consider biotin interference. If needed, order repeat testing after stopping biotin. Performed at [facility], [place] [address] [place], [place] [address]    Preg Test, Ur 09/06/2022 Negative  Negative Final   Preg Test, Ur 09/06/2022 NEGATIVE  NEGATIVE Final   Comment:        THE SENSITIVITY OF THIS METHODOLOGY IS >24 mIU/mL    Preg Test, Ur 09/05/2022 NEGATIVE  NEGATIVE Final   Comment:        THE SENSITIVITY OF  THIS METHODOLOGY IS >24 mIU/mL    Sodium 09/13/2022 136  135 -  145 mmol/L Final   Potassium 09/13/2022 4.5  3.5 - 5.1 mmol/L Final   Chloride 09/13/2022 105  98 - 111 mmol/L Final   CO2 09/13/2022 21 (L)  22 - 32 mmol/L Final   Glucose, Bld 09/13/2022 111 (H)  70 - 99 mg/dL Final   Glucose reference range applies only to samples taken after fasting for at least 8 hours.   BUN 09/13/2022 14  6 - 20 mg/dL Final   Creatinine, Ser 09/13/2022 0.92  0.44 - 1.00 mg/dL Final   Calcium 09/13/2022 9.1  8.9 - 10.3 mg/dL Final   GFR, Estimated 09/13/2022 >60  >60 mL/min Final   Comment: (NOTE) Calculated using the CKD-EPI Creatinine Equation (2021)    Anion gap 09/13/2022 10  5 - 15 Final   Performed at [facility], [place] [address] [place], [place] [address]   Vitamin B-12 09/13/2022 585  180 - 914 pg/mL Final   Comment: (NOTE) This assay is not validated for testing neonatal or myeloproliferative syndrome specimens for Vitamin B12 levels. Performed at [facility], [place] [address] [place], [place] [address]    Color, Urine 09/14/2022 YELLOW  YELLOW Final   APPearance 09/14/2022 HAZY (A)  CLEAR Final   Specific Gravity, Urine 09/14/2022 1.025  1.005 - 1.030 Final   pH 09/14/2022 5.0  5.0 - 8.0 Final   Glucose, UA 09/14/2022 NEGATIVE  NEGATIVE mg/dL Final   Hgb urine dipstick 09/14/2022 NEGATIVE  NEGATIVE Final   Bilirubin Urine 09/14/2022 NEGATIVE  NEGATIVE Final   Ketones, ur 09/14/2022 NEGATIVE  NEGATIVE mg/dL Final   Protein, ur 09/14/2022 NEGATIVE  NEGATIVE mg/dL Final   Nitrite 09/14/2022 NEGATIVE  NEGATIVE Final   Leukocytes,Ua 09/14/2022 TRACE (A)  NEGATIVE Final   RBC / HPF 09/14/2022 0-5  0 - 5 RBC/hpf Final   WBC, UA 09/14/2022 0-5  0 - 5 WBC/hpf Final   Bacteria, UA 09/14/2022 RARE (A)  NONE SEEN Final   Squamous Epithelial / HPF 09/14/2022 0-5  0 - 5 Final   Mucus 09/14/2022 PRESENT   Final   Performed at [facility], [place] [address], [place], [place] [address]   Glucose-Capillary 09/16/2022 105 (H)  70 - 99 mg/dL Final    Glucose reference range applies only to samples taken after fasting for at least 8 hours.   SARS Coronavirus 2 by RT PCR 09/30/2022 NEGATIVE  NEGATIVE Final   Comment: (NOTE) SARS-CoV-2 target nucleic acids are NOT DETECTED.  The SARS-CoV-2 RNA is generally detectable in upper respiratory specimens during the acute phase of infection. The lowest concentration of SARS-CoV-2 viral copies this assay can detect is 138 copies/mL. A negative result does not preclude SARS-Cov-2 infection and should not be used as the sole basis for treatment or other patient management decisions. A negative result may occur with  improper specimen collection/handling, submission of specimen other than nasopharyngeal swab, presence of viral mutation(s) within the areas targeted by this assay, and inadequate number of viral copies(<138 copies/mL). A negative result must be combined with clinical observations, patient history, and epidemiological information. The expected result is Negative.  Fact Sheet for Patients:  EntrepreneurPulse.com.au  Fact Sheet for Healthcare Providers:  IncredibleEmployment.be  This test is no                          t yet approved or  cleared by the [place] and  has been authorized for detection and/or diagnosis of SARS-CoV-2 by FDA under an Emergency Use Authorization (EUA). This EUA will remain  in effect (meaning this test can be used) for the duration of the COVID-19 declaration under Section 564(b)(1) of the Act, 21 U.S.C.section 360bbb-3(b)(1), unless the authorization is terminated  or revoked sooner.       Influenza A by PCR 09/30/2022 NEGATIVE  NEGATIVE Final   Influenza B by PCR 09/30/2022 NEGATIVE  NEGATIVE Final   Comment: (NOTE) The Xpert Xpress SARS-CoV-2/FLU/RSV plus assay is intended as an aid in the diagnosis of influenza from Nasopharyngeal swab specimens and should not be used as a sole basis for treatment.  Nasal washings and aspirates are unacceptable for Xpert Xpress SARS-CoV-2/FLU/RSV testing.  Fact Sheet for Patients: EntrepreneurPulse.com.au  Fact Sheet for Healthcare Providers: IncredibleEmployment.be  This test is not yet approved or cleared by the [place] FDA and has been authorized for detection and/or diagnosis of SARS-CoV-2 by FDA under an Emergency Use Authorization (EUA). This EUA will remain in effect (meaning this test can be used) for the duration of the COVID-19 declaration under Section 564(b)(1) of the Act, 21 U.S.C. section 360bbb-3(b)(1), unless the authorization is terminated or revoked.     Resp Syncytial Virus by PCR 09/30/2022 NEGATIVE  NEGATIVE Final   Comment: (NOTE) Fact Sheet for Patients: EntrepreneurPulse.com.au  Fact Sheet for Healthcare Providers: IncredibleEmployment.be  This test is not yet approved or cleared by the [place] FDA and has been authorized for detection and/or diagnosis of SARS-CoV-2 by FDA under an Emergency Use Authorization (EUA). This EUA will remain in effect (meaning this test can be used) for the duration of the COVID-19 declaration under Section 564(b)(1) of the Act, 21 U.S.C. section 360bbb-3(b)(1), unless the authorization is terminated or revoked.  Performed at [facility], [place] [address] [place], [place] [address]    Sodium 10/01/2022 136  135 - 145 mmol/L Final   Potassium 10/01/2022 3.8  3.5 - 5.1 mmol/L Final   Chloride 10/01/2022 104  98 - 111 mmol/L Final   CO2 10/01/2022 27  22 - 32 mmol/L Final   Glucose, Bld 10/01/2022 98  70 - 99 mg/dL Final   Glucose reference range applies only to samples taken after fasting for at least 8 hours.   BUN 10/01/2022 12  6 - 20 mg/dL Final   Creatinine, Ser 10/01/2022 0.98  0.44 - 1.00 mg/dL Final   Calcium 10/01/2022 8.9  8.9 - 10.3 mg/dL Final   GFR, Estimated 10/01/2022 >60  >60  mL/min Final   Comment: (NOTE) Calculated using the CKD-EPI Creatinine Equation (2021)    Anion gap 10/01/2022 5  5 - 15 Final   Performed at [facility], [place] [address] [place], [place] [address]   WBC 10/01/2022 5.1  4.0 - 10.5 K/uL Final   RBC 10/01/2022 4.31  3.87 - 5.11 MIL/uL Final   Hemoglobin 10/01/2022 12.7  12.0 - 15.0 g/dL Final   HCT 10/01/2022 38.8  36.0 - 46.0 % Final   MCV 10/01/2022 90.0  80.0 - 100.0 fL Final   MCH 10/01/2022 29.5  26.0 - 34.0 pg Final   MCHC 10/01/2022 32.7  30.0 - 36.0 g/dL Final   RDW 10/01/2022 12.4  11.5 - 15.5 % Final   Platelets 10/01/2022 300  150 - 400 K/uL Final   nRBC 10/01/2022 0.0  0.0 - 0.2 % Final   Performed at  [facility], [place] [address] [place], [place] [address]   Lipase 10/01/2022 29  11 - 51 U/L Final   Performed at [place] [address], [place], [place] [address]   Color, Urine 10/05/2022 YELLOW  YELLOW Final   APPearance 10/05/2022 HAZY (A)  CLEAR Final   Specific Gravity, Urine 10/05/2022 1.018  1.005 - 1.030 Final   pH 10/05/2022 5.0  5.0 - 8.0 Final   Glucose, UA 10/05/2022 NEGATIVE  NEGATIVE mg/dL Final   Hgb urine dipstick 10/05/2022 NEGATIVE  NEGATIVE Final   Bilirubin Urine 10/05/2022 NEGATIVE  NEGATIVE Final   Ketones, ur 10/05/2022 NEGATIVE  NEGATIVE mg/dL Final   Protein, ur 10/05/2022 NEGATIVE  NEGATIVE mg/dL Final   Nitrite 10/05/2022 NEGATIVE  NEGATIVE Final   Leukocytes,Ua 10/05/2022 TRACE (A)  NEGATIVE Final   RBC / HPF 10/05/2022 0-5  0 - 5 RBC/hpf Final   WBC, UA 10/05/2022 0-5  0 - 5 WBC/hpf Final   Bacteria, UA 10/05/2022 RARE (A)  NONE SEEN Final   Squamous Epithelial / HPF 10/05/2022 0-5  0 - 5 Final   Mucus 10/05/2022 PRESENT   Final   Performed at [facility], [place] [address] [place], [place] [address]   Specimen Description 10/05/2022 URINE, CLEAN CATCH   Final   Special Requests 10/05/2022    Final                   Value:NONE Performed at [place], [place] [address], [place], [place] [address]    Culture 10/05/2022 10,000 COLONIES/mL MULTIPLE SPECIES PRESENT, SUGGEST RECOLLECTION (A)   Final   Report Status 10/05/2022 10/07/2022 FINAL   Final  Admission on 07/04/2022, Discharged on 07/05/2022  Component Date Value Ref Range Status   Sodium 07/04/2022 142  135 - 145 mmol/L Final   Potassium 07/04/2022 4.4  3.5 - 5.1 mmol/L Final   Chloride 07/04/2022 109  98 - 111 mmol/L Final   CO2 07/04/2022 26  22 - 32 mmol/L Final   Glucose, Bld 07/04/2022 96  70 - 99 mg/dL Final   Glucose reference range applies only to samples taken after fasting for at least 8 hours.   BUN 07/04/2022 15  6 - 20 mg/dL Final   Creatinine, Ser 07/04/2022 0.83  0.44 - 1.00 mg/dL Final   Calcium 07/04/2022 9.3  8.9 - 10.3 mg/dL Final   Total Protein 07/04/2022 7.2  6.5 - 8.1 g/dL Final   Albumin 07/04/2022 3.9  3.5 - 5.0 g/dL Final   AST 07/04/2022 23  15 - 41 U/L Final   ALT 07/04/2022 28  0 - 44 U/L Final   Alkaline Phosphatase 07/04/2022 77  38 - 126 U/L Final   Total Bilirubin 07/04/2022 0.4  0.3 - 1.2 mg/dL Final   GFR, Estimated 07/04/2022 >60  >60 mL/min Final   Comment: (NOTE) Calculated using the CKD-EPI Creatinine Equation (2021)    Anion gap 07/04/2022 7  5 - 15 Final   Performed at [facility], [place] [address] [place], [place] [address]   Alcohol, Ethyl (B) 07/04/2022 <10  <10 mg/dL Final   Comment: (NOTE) Lowest detectable limit for serum alcohol is 10 mg/dL.  For medical purposes only. Performed at [facility], [place] [address] [place], [place] [address]    WBC 07/04/2022 7.0  4.0 - 10.5 K/uL Final   RBC 07/04/2022 4.18  3.87 - 5.11 MIL/uL Final   Hemoglobin  07/04/2022 12.8  12.0 - 15.0 g/dL Final   HCT 85/27/7824 39.1  36.0 - 46.0 % Final   MCV 07/04/2022 93.5  80.0 - 100.0 fL Final   MCH 07/04/2022 30.6  26.0 - 34.0 pg Final   MCHC 07/04/2022 32.7  30.0 - 36.0 g/dL Final   RDW 23/53/6144 12.9  11.5 -  15.5 % Final   Platelets 07/04/2022 243  150 - 400 K/uL Final   nRBC 07/04/2022 0.0  0.0 - 0.2 % Final   Neutrophils Relative % 07/04/2022 43  % Final   Neutro Abs 07/04/2022 3.0  1.7 - 7.7 K/uL Final   Lymphocytes Relative 07/04/2022 50  % Final   Lymphs Abs 07/04/2022 3.5  0.7 - 4.0 K/uL Final   Monocytes Relative 07/04/2022 7  % Final   Monocytes Absolute 07/04/2022 0.5  0.1 - 1.0 K/uL Final   Eosinophils Relative 07/04/2022 0  % Final   Eosinophils Absolute 07/04/2022 0.0  0.0 - 0.5 K/uL Final   Basophils Relative 07/04/2022 0  % Final   Basophils Absolute 07/04/2022 0.0  0.0 - 0.1 K/uL Final   Immature Granulocytes 07/04/2022 0  % Final   Abs Immature Granulocytes 07/04/2022 0.01  0.00 - 0.07 K/uL Final   Performed at [facility], [address], [place], [place] [address]   I-stat hCG, quantitative 07/04/2022 <5.0  <5 mIU/mL Final   Comment 3 07/04/2022          Final   Comment:   GEST. AGE      CONC.  (mIU/mL)   <=1 WEEK        5 - 50     2 WEEKS       50 - 500     3 WEEKS       100 - 10,000     4 WEEKS     1,000 - 30,000        FEMALE AND NON-PREGNANT FEMALE:     LESS THAN 5 mIU/mL   Admission on 06/14/2022, Discharged on 06/14/2022  Component Date Value Ref Range Status   Color, UA 06/14/2022 yellow  yellow Final   Clarity, UA 06/14/2022 cloudy (A)  clear Final   Glucose, UA 06/14/2022 negative  negative mg/dL Final   Bilirubin, UA 08/67/6195 negative  negative Final   Ketones, POC UA 06/14/2022 negative  negative mg/dL Final   Spec Grav, UA 09/32/6712 1.025  1.010 - 1.025 Final   Blood, UA 06/14/2022 negative  negative Final   pH, UA 06/14/2022 7.5  5.0 - 8.0 Final   Protein Ur, POC 06/14/2022 =30 (A)  negative mg/dL Final   Urobilinogen, UA 06/14/2022 0.2  0.2 or 1.0 E.U./dL Final   Nitrite, UA 45/80/9983 Negative  Negative Final   Leukocytes, UA 06/14/2022 Negative  Negative Final   Preg Test, Ur 06/14/2022 Negative  Negative Final   Specimen  Description 06/14/2022 URINE, CLEAN CATCH   Final   Special Requests 06/14/2022    Final                   Value:NONE Performed at [facility], [address], [place], [place] [address]    Culture 06/14/2022 MULTIPLE SPECIES PRESENT, SUGGEST RECOLLECTION (A)   Final   Report Status 06/14/2022 06/16/2022 FINAL   Final  Admission on 06/07/2022, Discharged on 06/10/2022  Component Date Value Ref Range Status   SARS Coronavirus 2 by RT PCR 06/07/2022 NEGATIVE  NEGATIVE Final  Comment: (NOTE) SARS-CoV-2 target nucleic acids are NOT DETECTED.  The SARS-CoV-2 RNA is generally detectable in upper respiratory specimens during the acute phase of infection. The lowest concentration of SARS-CoV-2 viral copies this assay can detect is 138 copies/mL. A negative result does not preclude SARS-Cov-2 infection and should not be used as the sole basis for treatment or other patient management decisions. A negative result may occur with  improper specimen collection/handling, submission of specimen other than nasopharyngeal swab, presence of viral mutation(s) within the areas targeted by this assay, and inadequate number of viral copies(<138 copies/mL). A negative result must be combined with clinical observations, patient history, and epidemiological information. The expected result is Negative.  Fact Sheet for Patients:  EntrepreneurPulse.com.au  Fact Sheet for Healthcare Providers:  IncredibleEmployment.be  This test is no                          t yet approved or cleared by the [place] FDA and  has been authorized for detection and/or diagnosis of SARS-CoV-2 by FDA under an Emergency Use Authorization (EUA). This EUA will remain  in effect (meaning this test can be used) for the duration of the COVID-19 declaration under Section 564(b)(1) of the Act, 21 U.S.C.section 360bbb-3(b)(1), unless the authorization is terminated  or revoked sooner.        Influenza A by PCR 06/07/2022 NEGATIVE  NEGATIVE Final   Influenza B by PCR 06/07/2022 NEGATIVE  NEGATIVE Final   Comment: (NOTE) The Xpert Xpress SARS-CoV-2/FLU/RSV plus assay is intended as an aid in the diagnosis of influenza from Nasopharyngeal swab specimens and should not be used as a sole basis for treatment. Nasal washings and aspirates are unacceptable for Xpert Xpress SARS-CoV-2/FLU/RSV testing.  Fact Sheet for Patients: EntrepreneurPulse.com.au  Fact Sheet for Healthcare Providers: IncredibleEmployment.be  This test is not yet approved or cleared by the [place] FDA and has been authorized for detection and/or diagnosis of SARS-CoV-2 by FDA under an Emergency Use Authorization (EUA). This EUA will remain in effect (meaning this test can be used) for the duration of the COVID-19 declaration under Section 564(b)(1) of the Act, 21 U.S.C. section 360bbb-3(b)(1), unless the authorization is terminated or revoked.  Performed at [facility], [place] [address] [place], [place] [address]    WBC 06/07/2022 5.7  4.0 - 10.5 K/uL Final   RBC 06/07/2022 4.39  3.87 - 5.11 MIL/uL Final   Hemoglobin 06/07/2022 13.2  12.0 - 15.0 g/dL Final   HCT 06/07/2022 40.4  36.0 - 46.0 % Final   MCV 06/07/2022 92.0  80.0 - 100.0 fL Final   MCH 06/07/2022 30.1  26.0 - 34.0 pg Final   MCHC 06/07/2022 32.7  30.0 - 36.0 g/dL Final   RDW 06/07/2022 12.4  11.5 - 15.5 % Final   Platelets 06/07/2022 308  150 - 400 K/uL Final   nRBC 06/07/2022 0.0  0.0 - 0.2 % Final   Neutrophils Relative % 06/07/2022 42  % Final   Neutro Abs 06/07/2022 2.4  1.7 - 7.7 K/uL Final   Lymphocytes Relative 06/07/2022 54  % Final   Lymphs Abs 06/07/2022 3.1  0.7 - 4.0 K/uL Final   Monocytes Relative 06/07/2022 4  % Final   Monocytes Absolute 06/07/2022 0.2  0.1 - 1.0 K/uL Final   Eosinophils Relative 06/07/2022 0  % Final   Eosinophils Absolute 06/07/2022 0.0  0.0 - 0.5  K/uL Final   Basophils Relative 06/07/2022  0  % Final   Basophils Absolute 06/07/2022 0.0  0.0 - 0.1 K/uL Final   Immature Granulocytes 06/07/2022 0  % Final   Abs Immature Granulocytes 06/07/2022 0.01  0.00 - 0.07 K/uL Final   Performed at [facility], [place] [address], [place], [place] [address]   Sodium 06/07/2022 139  135 - 145 mmol/L Final   Potassium 06/07/2022 4.0  3.5 - 5.1 mmol/L Final   Chloride 06/07/2022 104  98 - 111 mmol/L Final   CO2 06/07/2022 28  22 - 32 mmol/L Final   Glucose, Bld 06/07/2022 104 (H)  70 - 99 mg/dL Final   Glucose reference range applies only to samples taken after fasting for at least 8 hours.   BUN 06/07/2022 8  6 - 20 mg/dL Final   Creatinine, Ser 06/07/2022 0.84  0.44 - 1.00 mg/dL Final   Calcium 06/07/2022 9.1  8.9 - 10.3 mg/dL Final   Total Protein 06/07/2022 6.9  6.5 - 8.1 g/dL Final   Albumin 06/07/2022 3.7  3.5 - 5.0 g/dL Final   AST 06/07/2022 19  15 - 41 U/L Final   ALT 06/07/2022 22  0 - 44 U/L Final   Alkaline Phosphatase 06/07/2022 54  38 - 126 U/L Final   Total Bilirubin 06/07/2022 0.4  0.3 - 1.2 mg/dL Final   GFR, Estimated 06/07/2022 >60  >60 mL/min Final   Comment: (NOTE) Calculated using the CKD-EPI Creatinine Equation (2021)    Anion gap 06/07/2022 7  5 - 15 Final   Performed at [place] [address] [place], [place] [address]   Hgb A1c MFr Bld 06/07/2022 5.0  4.8 - 5.6 % Final   Comment: (NOTE) Pre diabetes:          5.7%-6.4%  Diabetes:              >6.4%  Glycemic control for   <7.0% adults with diabetes    Mean Plasma Glucose 06/07/2022 96.8  mg/dL Final   Performed at [facility], [place] [address] [place], [place] [address]   TSH 06/07/2022 1.620  0.350 - 4.500 uIU/mL Final   Comment: Performed by a 3rd Generation assay with a functional sensitivity of <=0.01 uIU/mL. Performed at [facility], [place] [address] [place], [place] [address]    RPR Ser Ql 06/07/2022 NON REACTIVE  NON REACTIVE  Final   Performed at [facility], [place] [address] [place], [place] [address]   Color, Urine 06/07/2022 YELLOW  YELLOW Final   APPearance 06/07/2022 HAZY (A)  CLEAR Final   Specific Gravity, Urine 06/07/2022 1.018  1.005 - 1.030 Final   pH 06/07/2022 7.0  5.0 - 8.0 Final   Glucose, UA 06/07/2022 NEGATIVE  NEGATIVE mg/dL Final   Hgb urine dipstick 06/07/2022 NEGATIVE  NEGATIVE Final   Bilirubin Urine 06/07/2022 NEGATIVE  NEGATIVE Final   Ketones, ur 06/07/2022 NEGATIVE  NEGATIVE mg/dL Final   Protein, ur 06/07/2022 NEGATIVE  NEGATIVE mg/dL Final   Nitrite 06/07/2022 NEGATIVE  NEGATIVE Final   Leukocytes,Ua 06/07/2022 NEGATIVE  NEGATIVE Final   Performed at [facility], [place] [address], [place], [place] [address]   Cholesterol 06/07/2022 164  0 - 200 mg/dL Final   Triglycerides 06/07/2022 158 (H)  <150 mg/dL Final   HDL 06/07/2022 44  >40 mg/dL Final   Total CHOL/HDL Ratio 06/07/2022 3.7  RATIO Final   VLDL 06/07/2022 32  0 - 40 mg/dL Final  LDL Cholesterol 06/07/2022 88  0 - 99 mg/dL Final   Comment:        Total Cholesterol/HDL:CHD Risk Coronary Heart Disease Risk Table                     Men   Women  1/2 Average Risk   3.4   3.3  Average Risk       5.0   4.4  2 X Average Risk   9.6   7.1  3 X Average Risk  23.4   11.0        Use the calculated Patient Ratio above and the CHD Risk Table to determine the patient's CHD Risk.        ATP III CLASSIFICATION (LDL):  <100     mg/dL   Optimal  100-129  mg/dL   Near or Above                    Optimal  130-159  mg/dL   Borderline  160-189  mg/dL   High  >190     mg/dL   Very High Performed at [place] [address], [place], [place] [address]    HIV Screen 4th Generation wRfx 06/07/2022 Non Reactive  Non Reactive Final   Performed at [facility], [place] [address] [place], [place] [address]   SARSCOV2ONAVIRUS 2 AG 06/07/2022 NEGATIVE  NEGATIVE Final   Comment: (NOTE) SARS-CoV-2 antigen NOT DETECTED.    Negative results are presumptive.  Negative results do not preclude SARS-CoV-2 infection and should not be used as the sole basis for treatment or other patient management decisions, including infection  control decisions, particularly in the presence of clinical signs and  symptoms consistent with COVID-19, or in those who have been in contact with the virus.  Negative results must be combined with clinical observations, patient history, and epidemiological information. The expected result is Negative.  Fact Sheet for Patients: HandmadeRecipes.com.cy  Fact Sheet for Healthcare Providers: FuneralLife.at  This test is not yet approved or cleared by the [place] FDA and  has been authorized for detection and/or diagnosis of SARS-CoV-2 by FDA under an Emergency Use Authorization (EUA).  This EUA will remain in effect (meaning this test can be used) for the duration of  the COV                          ID-19 declaration under Section 564(b)(1) of the Act, 21 U.S.C. section 360bbb-3(b)(1), unless the authorization is terminated or revoked sooner.     POC Amphetamine UR 06/07/2022 None Detected  NONE DETECTED (Cut Off Level 1000 ng/mL) Final   POC Secobarbital (BAR) 06/07/2022 None Detected  NONE DETECTED (Cut Off Level 300 ng/mL) Final   POC Buprenorphine (BUP) 06/07/2022 None Detected  NONE DETECTED (Cut Off Level 10 ng/mL) Final   POC Oxazepam (BZO) 06/07/2022 None Detected  NONE DETECTED (Cut Off Level 300 ng/mL) Final   POC Cocaine UR 06/07/2022 None Detected  NONE DETECTED (Cut Off Level 300 ng/mL) Final   POC Methamphetamine UR 06/07/2022 None Detected  NONE DETECTED (Cut Off Level 1000 ng/mL) Final   POC Morphine 06/07/2022 None Detected  NONE DETECTED (Cut Off Level 300 ng/mL) Final   POC Methadone UR 06/07/2022 None Detected  NONE DETECTED (Cut Off Level 300 ng/mL) Final   POC Oxycodone UR 06/07/2022 None Detected  NONE  DETECTED (Cut Off Level 100 ng/mL)  Final   POC Marijuana UR 06/07/2022 None Detected  NONE DETECTED (Cut Off Level 50 ng/mL) Final   Preg Test, Ur 06/08/2022 NEGATIVE  NEGATIVE Final   Comment:        THE SENSITIVITY OF THIS METHODOLOGY IS >20 mIU/mL. Performed at [facility], [place] [address] [place], [place] [address]    Preg Test, Ur 06/08/2022 NEGATIVE  NEGATIVE Final   Comment:        THE SENSITIVITY OF THIS METHODOLOGY IS >24 mIU/mL     Blood Alcohol level:  Lab Results  Component Value Date   ETH <10 07/04/2022   ETH <10 [id number]    Metabolic Disorder Labs: Lab Results  Component Value Date   HGBA1C 5.7 (H) 10/15/2022   MPG 117 10/15/2022   MPG 96.8 06/07/2022   No results found for: "PROLACTIN" Lab Results  Component Value Date   CHOL 181 07/25/2022   TRIG 118 07/25/2022   HDL 45 07/25/2022   CHOLHDL 4.0 07/25/2022   VLDL 24 07/25/2022   LDLCALC 112 (H) 07/25/2022   LDLCALC 88 06/07/2022    Therapeutic Lab Levels: No results found for: "LITHIUM" No results found for: "VALPROATE" No results found for: "CBMZ"  Physical Findings   GAD-7    [place] Office Visit from 12/17/2021 in [place]  Total GAD-7 Score 17      PHQ2-9    [place] ED from 10/07/2022 in [facility] ED from 06/07/2022 in [facility] Office Visit from 12/17/2021 in [place]  PHQ-2 Total Score 0 1 2  PHQ-9 Total Score -- 2 8      Flowsheet Row ED from 10/07/2022 in [facility] Most recent reading at 10/27/2022  9:31 AM ED from 10/07/2022 in [place] Most recent reading at 10/07/2022 12:18 PM ED from 07/25/2022 in [facility] Most recent reading at 10/06/2022  9:38 AM  C-SSRS RISK CATEGORY No Risk No Risk No Risk        Musculoskeletal  Strength &  Muscle Tone: within normal limits Gait & Station: normal Patient leans: N/A  Psychiatric Specialty Exam  Presentation  General Appearance:  Appropriate for Environment  Eye Contact: Fair  Speech: Clear and Coherent  Speech Volume: Normal  Handedness: Right   Mood and Affect  Mood: Euthymic  Affect: Appropriate   Thought Process  Thought Processes: Coherent  Descriptions of Associations:Intact  Orientation:Full (Time, Place and Person)  Thought Content:Logical  Diagnosis of Schizophrenia or Schizoaffective disorder in past: Yes  Duration of Psychotic Symptoms: Greater than six months   Hallucinations:Hallucinations: Auditory Description of Auditory Hallucinations: "I hear voices when I am sleeping, they tell me to bang my head"  Ideas of Reference:None  Suicidal Thoughts:Suicidal Thoughts: No  Homicidal Thoughts:Homicidal Thoughts: No   Sensorium  Memory: Immediate Fair; Recent Fair; Remote Fair  Judgment: Fair  Insight: Fair   Community education officer  Concentration: Fair  Attention Span: Fair  Recall: [employer] of Knowledge: Fair  Language: Fair   Psychomotor Activity  Psychomotor Activity: Psychomotor Activity: Normal   Assets  Assets: Communication Skills; Desire for Improvement; Physical Health; Social Support   Sleep  Sleep: Sleep: Good Number of Hours of Sleep: 9   Nutritional Assessment (For [facility] and [facility] admissions only) Has the patient had a weight loss or gain of 10 pounds or more in  the last 3 months?: No Has the patient had a decrease in food intake/or appetite?: No Does the patient have dental problems?: No Does the patient have eating habits or behaviors that may be indicators of an eating disorder including binging or inducing vomiting?: No Has the patient recently lost weight without trying?: 0 Has the patient been eating poorly because of a decreased appetite?: 0 Malnutrition Screening Tool Score:  0    Physical Exam  Physical Exam Review of Systems  Constitutional: Negative.   HENT: Negative.    Eyes: Negative.   Respiratory: Negative.    Cardiovascular: Negative.   Gastrointestinal: Negative.   Genitourinary: Negative.   Musculoskeletal: Negative.   Skin: Negative.   Neurological: Negative.   Endo/Heme/Allergies: Negative.   Psychiatric/Behavioral:  Positive for hallucinations.    Blood pressure 99/65, pulse 96, temperature (!) 97.5 F (36.4 C), temperature source Oral, resp. rate 16, height 5\' 1"  (1.549 m), weight 198 lb (89.8 kg), SpO2 99 %. Body mass index is 37.41 kg/m.  Treatment Plan Summary: Daily contact with patient to assess and evaluate symptoms and progress in treatment and Medication management  Continue with current medication regimen and safety monitoring  Awaits for placement  Ronelle Nigh, NP 10/28/2022 3:58 PM

## 2022-10-28 NOTE — ED Notes (Signed)
Patient observed/assessed in bed/chair resting quietly appearing in no distress and verbalizing no complaints at this time. Will continue to monitor.  

## 2022-10-28 NOTE — ED Notes (Signed)
Pt A&O x 4, no distress noted, quiet & withdrawn, pleasant.  Watching TV at present.  Monitoring for safety.

## 2022-10-29 DIAGNOSIS — N9489 Other specified conditions associated with female genital organs and menstrual cycle: Secondary | ICD-10-CM | POA: Diagnosis not present

## 2022-10-29 DIAGNOSIS — E039 Hypothyroidism, unspecified: Secondary | ICD-10-CM | POA: Diagnosis not present

## 2022-10-29 DIAGNOSIS — Z1152 Encounter for screening for COVID-19: Secondary | ICD-10-CM | POA: Diagnosis not present

## 2022-10-29 DIAGNOSIS — F17200 Nicotine dependence, unspecified, uncomplicated: Secondary | ICD-10-CM | POA: Diagnosis not present

## 2022-10-29 DIAGNOSIS — F209 Schizophrenia, unspecified: Secondary | ICD-10-CM | POA: Diagnosis not present

## 2022-10-29 DIAGNOSIS — E118 Type 2 diabetes mellitus with unspecified complications: Secondary | ICD-10-CM | POA: Diagnosis not present

## 2022-10-29 DIAGNOSIS — F333 Major depressive disorder, recurrent, severe with psychotic symptoms: Secondary | ICD-10-CM | POA: Diagnosis not present

## 2022-10-29 DIAGNOSIS — N390 Urinary tract infection, site not specified: Secondary | ICD-10-CM | POA: Diagnosis not present

## 2022-10-29 DIAGNOSIS — F419 Anxiety disorder, unspecified: Secondary | ICD-10-CM | POA: Diagnosis not present

## 2022-10-29 NOTE — ED Notes (Signed)
Patient off the floor with [name].

## 2022-10-29 NOTE — ED Notes (Signed)
Pt asleep at this hour. No apparent distress. RR even and unlabored. Monitored for safety.

## 2022-10-29 NOTE — ED Provider Notes (Signed)
Behavioral Health Progress Note  Date and Time: 10/29/2022 9:32 AM Name: Nicole MassonLatasha Yurick MRN:  [id number]  Subjective:   Patient was seen face to face by this provider and chart reviewed.   On evaluation patient is alert and oriented x3, speech is clear and coherent. Patient's eye contact is fair, mood is euthymic, affect is appropriate.  Patient's thought process is goal directed and thought content is within normal limits. Patient denies SI HI, denies AVH, or paranoia. There is no indication that the patient is responding to internal stimuli and no delusion noted.    Patient appear pleasant and excited this morning, she told this provider that she is looking forward to her new group home on Monday. Patient states "I am ready". Patient reported taking her medication daily, she denies any side effects from her current medication. Patient states she is sleeping well but hears voices in her sleep sometimes. Patient reported having good appetite.    Support, encouragement and reassurance provided, and patient provided with opportunity for questions.    Brief HPI: Nicole MassonLatasha Robins 32 y.o., female patient with schizophrenia, MDD with psychosis, presented to [facility], voluntarily, as a walk-in accompanied by [employer] after a disagreement and verbal altercation with personnel at the group home. She has been dismissed previously from other group homes due to threatening behaviors and suicidal ideation. Patient was admitted to continuous assessment unit on 07/25/2022 as she also endorsed passive SI at that time along with symptoms of visual hallucination. Patient was subsequently psychiatrically cleared during her admission to the continuous assessment unit. However patient remains [facility] at [facility] bordering until adequate- residential placement can be obtained.      Legal Guardian: [name](Maxine Brown) transitioning to be [name](Albert Brown) Point of contact: [name](Albert Brown).    Diagnosis:  Final diagnoses:   Tobacco use disorder  Schizophrenia, unspecified type (HCC)  MDD (major depressive disorder), recurrent episode, moderate (HCC)  Hypothyroidism, unspecified type    Total Time spent with patient: 20 minutes  Past Psychiatric History: Schizophrenia unspecified, Major Depressive Disorder recurrent moderate, suicidal ideation, tobacco use disorder, borderline intellectual functioning.  Past Medical History:  Past Medical History:  Diagnosis Date   Anxiety    Depression    Hypothyroidism 08/07/2022   Tobacco use disorder 08/07/2022    Past Surgical History:  Procedure Laterality Date   WISDOM TOOTH EXTRACTION Bilateral 2020   Family History:  Family History  Problem Relation Age of Onset   Hypertension Father    Diabetes Father    Family Psychiatric  History: No history reported Social History:  Social History   Substance and Sexual Activity  Alcohol Use Never     Social History   Substance and Sexual Activity  Drug Use Never    Social History   Socioeconomic History   Marital status: Single    Spouse name: Not on file   Number of children: Not on file   Years of education: Not on file   Highest education level: Not on file  Occupational History   Not on file  Tobacco Use   Smoking status: Every Day    Types: Cigarettes   Smokeless tobacco: Never  Vaping Use   Vaping Use: Some days  Substance and Sexual Activity   Alcohol use: Never   Drug use: Never   Sexual activity: Yes    Partners: Female    Birth control/protection: Implant  Other Topics Concern   Not on file  Social History Narrative  Not on file   Social Determinants of Health   Financial Resource Strain: Not on file  Food Insecurity: Not on file  Transportation Needs: Not on file  Physical Activity: Not on file  Stress: Not on file  Social Connections: Not on file   SDOH:  SDOH Screenings   Depression (PHQ2-9): Low Risk  (10/28/2022)  Tobacco Use: High Risk (10/21/2022)   Additional  Social History:                         Sleep: Good  Appetite:  Good  Current Medications:  Current Facility-Administered Medications  Medication Dose Route Frequency Provider Last Rate Last Admin   acetaminophen (TYLENOL) tablet 650 mg  650 mg Oral Q6H PRN Marlou Sa, NP   650 mg at 10/28/22 1054   alum & mag hydroxide-simeth (MAALOX/MYLANTA) 200-200-20 MG/5ML suspension 30 mL  30 mL Oral Q4H PRN Marlou Sa, NP   30 mL at 10/26/22 1755   ARIPiprazole ER (ABILIFY MAINTENA) 400 MG prefilled syringe 400 mg  400 mg Intramuscular Q28 days Princess Bruins, DO   400 mg at 10/25/22 0920   cetaphil lotion   Topical PRN Princess Bruins, DO       fluticasone [facility]) 50 MCG/ACT nasal spray 1 spray  1 spray Each Nare QHS Princess Bruins, DO   1 spray at 10/28/22 2104   hydrOXYzine (ATARAX) tablet 25 mg  25 mg Oral TID PRN Lorri Frederick, MD   25 mg at 10/28/22 2104   ketoconazole (NIZORAL) 2 % cream   Topical BID Princess Bruins, DO   Given at 10/28/22 2104   levothyroxine (SYNTHROID) tablet 100 mcg  100 mcg Oral Once Byungura, Veronique M, NP       levothyroxine (SYNTHROID) tablet 100 mcg  100 mcg Oral Q0600 Marlou Sa, NP   100 mcg at 10/29/22 0614   lidocaine (LIDODERM) 5 % 1 patch  1 patch Transdermal Q24H Princess Bruins, DO   1 patch at 10/28/22 0934   loratadine (CLARITIN) tablet 10 mg  10 mg Oral Daily Carrion-Carrero, Karle Starch, MD   10 mg at 10/29/22 0911   magnesium hydroxide (MILK OF MAGNESIA) suspension 30 mL  30 mL Oral Daily PRN Marlou Sa, NP   30 mL at 10/12/22 2145   metFORMIN (GLUCOPHAGE) tablet 500 mg  500 mg Oral Q breakfast Carrion-Carrero, Margely, MD   500 mg at 10/29/22 4098   nicotine (NICODERM CQ - dosed in mg/24 hours) patch 14 mg  14 mg Transdermal Daily PRN Carrion-Carrero, Karle Starch, MD       ondansetron (ZOFRAN-ODT) disintegrating tablet 4 mg  4 mg Oral Q8H PRN Carrion-Carrero, Margely, MD   4 mg at 10/22/22 1806    Oxcarbazepine (TRILEPTAL) tablet 300 mg  300 mg Oral BID Byungura, Veronique M, NP   300 mg at 10/29/22 0911   pantoprazole (PROTONIX) EC tablet 40 mg  40 mg Oral Daily Carrion-Carrero, Karle Starch, MD   40 mg at 10/29/22 0912   polyethylene glycol (MIRALAX / GLYCOLAX) packet 17 g  17 g Oral Daily Princess Bruins, DO   17 g at 10/29/22 0913   QUEtiapine (SEROQUEL) tablet 400 mg  400 mg Oral BID Rayburn Go, Veronique M, NP   400 mg at 10/29/22 0911   sertraline (ZOLOFT) tablet 150 mg  150 mg Oral Daily Carrion-Carrero, Karle Starch, MD   150 mg at 10/29/22 0911   sodium chloride (OCEAN) 0.65 % nasal spray 1 spray  1 spray Each Nare Daily Merrily Brittle, DO   1 spray at 10/29/22 0912   traZODone (DESYREL) tablet 100 mg  100 mg Oral QHS Ronelle Nigh M, NP   100 mg at 10/28/22 2104   traZODone (DESYREL) tablet 50 mg  50 mg Oral QHS PRN Haynes Kerns, NP   50 mg at 10/28/22 2105   valACYclovir (VALTREX) tablet 500 mg  500 mg Oral Daily Carrion-Carrero, Margely, MD   500 mg at 10/29/22 0911   zinc oxide (BALMEX) 11.3 % cream   Topical PRN Merrily Brittle, DO       Current Outpatient Medications  Medication Sig Dispense Refill   ABILIFY MAINTENA 400 MG PRSY prefilled syringe 400 mg every 28 (twenty-eight) days.     cetirizine (ZYRTEC) 10 MG tablet Take 10 mg by mouth daily.     cyclobenzaprine (FLEXERIL) 10 MG tablet Take 1 tablet (10 mg total) by mouth 2 (two) times daily as needed for muscle spasms. 20 tablet 0   fluticasone (FLONASE) 50 MCG/ACT nasal spray Place 1 spray into both nostrils daily.     meloxicam (MOBIC) 15 MG tablet Take 15 mg by mouth daily.     nitrofurantoin, macrocrystal-monohydrate, (MACROBID) 100 MG capsule Take 1 capsule (100 mg total) by mouth 2 (two) times daily. 10 capsule 0   ondansetron (ZOFRAN-ODT) 4 MG disintegrating tablet Take 1 tablet (4 mg total) by mouth every 8 (eight) hours as needed for nausea or vomiting. 20 tablet 0   Oxcarbazepine (TRILEPTAL) 300 MG tablet Take 1  tablet (300 mg total) by mouth 2 (two) times daily. 60 tablet 0   QUEtiapine (SEROQUEL) 400 MG tablet Take 1 tablet (400 mg total) by mouth 2 (two) times daily. 60 tablet 0   sertraline (ZOLOFT) 50 MG tablet Take 3 tablets (150 mg total) by mouth in the morning. 90 tablet 0   traZODone (DESYREL) 100 MG tablet Take 1 tablet (100 mg total) by mouth at bedtime. 30 tablet 0   valACYclovir (VALTREX) 500 MG tablet Take 500 mg by mouth daily.      Labs  Lab Results:  Admission on 10/07/2022  Component Date Value Ref Range Status   Hgb A1c MFr Bld 10/15/2022 5.7 (H)  4.8 - 5.6 % Final   Comment: (NOTE)         Prediabetes: 5.7 - 6.4         Diabetes: >6.4         Glycemic control for adults with diabetes: <7.0    Mean Plasma Glucose 10/15/2022 117  mg/dL Final   Comment: (NOTE) Performed At: [facility] [place], [place] [id number] Rush Farmer MD AT:[id number]   Admission on 10/07/2022, Discharged on 10/07/2022  Component Date Value Ref Range Status   Color, Urine 10/07/2022 YELLOW  YELLOW Final   APPearance 10/07/2022 HAZY (A)  CLEAR Final   Specific Gravity, Urine 10/07/2022 1.011  1.005 - 1.030 Final   pH 10/07/2022 7.0  5.0 - 8.0 Final   Glucose, UA 10/07/2022 NEGATIVE  NEGATIVE mg/dL Final   Hgb urine dipstick 10/07/2022 SMALL (A)  NEGATIVE Final   Bilirubin Urine 10/07/2022 NEGATIVE  NEGATIVE Final   Ketones, ur 10/07/2022 NEGATIVE  NEGATIVE mg/dL Final   Protein, ur 10/07/2022 NEGATIVE  NEGATIVE mg/dL Final   Nitrite 10/07/2022 NEGATIVE  NEGATIVE Final   Leukocytes,Ua 10/07/2022 LARGE (A)  NEGATIVE Final   RBC / HPF 10/07/2022 0-5  0 - 5 RBC/hpf Final   WBC, UA  10/07/2022 0-5  0 - 5 WBC/hpf Final   Bacteria, UA 10/07/2022 FEW (A)  NONE SEEN Final   Squamous Epithelial / HPF 10/07/2022 11-20  0 - 5 Final   Performed at [facility], [address], [place], [place] [address]   I-stat hCG, quantitative 10/07/2022 <5.0  <5 mIU/mL Final   Comment 3  10/07/2022          Final   Comment:   GEST. AGE      CONC.  (mIU/mL)   <=1 WEEK        5 - 50     2 WEEKS       50 - 500     3 WEEKS       100 - 10,000     4 WEEKS     1,000 - 30,000        FEMALE AND NON-PREGNANT FEMALE:     LESS THAN 5 mIU/mL    Sodium 10/07/2022 138  135 - 145 mmol/L Final   Potassium 10/07/2022 4.3  3.5 - 5.1 mmol/L Final   Chloride 10/07/2022 104  98 - 111 mmol/L Final   CO2 10/07/2022 28  22 - 32 mmol/L Final   Glucose, Bld 10/07/2022 90  70 - 99 mg/dL Final   Glucose reference range applies only to samples taken after fasting for at least 8 hours.   BUN 10/07/2022 8  6 - 20 mg/dL Final   Creatinine, Ser 10/07/2022 0.96  0.44 - 1.00 mg/dL Final   Calcium 60/45/4098 9.1  8.9 - 10.3 mg/dL Final   GFR, Estimated 10/07/2022 >60  >60 mL/min Final   Comment: (NOTE) Calculated using the CKD-EPI Creatinine Equation (2021)    Anion gap 10/07/2022 6  5 - 15 Final   Performed at [facility], [address] [place], [place] [address]   WBC 10/07/2022 5.8  4.0 - 10.5 K/uL Final   RBC 10/07/2022 4.36  3.87 - 5.11 MIL/uL Final   Hemoglobin 10/07/2022 12.9  12.0 - 15.0 g/dL Final   HCT 78/29/5621 40.0  36.0 - 46.0 % Final   MCV 10/07/2022 91.7  80.0 - 100.0 fL Final   MCH 10/07/2022 29.6  26.0 - 34.0 pg Final   MCHC 10/07/2022 32.3  30.0 - 36.0 g/dL Final   RDW 30/86/5784 12.4  11.5 - 15.5 % Final   Platelets 10/07/2022 295  150 - 400 K/uL Final   nRBC 10/07/2022 0.0  0.0 - 0.2 % Final   Performed at [facility], [address], [place], [place] [address]   SARS Coronavirus 2 by RT PCR 10/07/2022 NEGATIVE  NEGATIVE Final   Comment: (NOTE) SARS-CoV-2 target nucleic acids are NOT DETECTED.  The SARS-CoV-2 RNA is generally detectable in upper respiratory specimens during the acute phase of infection. The lowest concentration of SARS-CoV-2 viral copies this assay can detect is 138 copies/mL. A negative result does not preclude SARS-Cov-2 infection and  should not be used as the sole basis for treatment or other patient management decisions. A negative result may occur with  improper specimen collection/handling, submission of specimen other than nasopharyngeal swab, presence of viral mutation(s) within the areas targeted by this assay, and inadequate number of viral copies(<138 copies/mL). A negative result must be combined with clinical observations, patient history, and epidemiological information. The expected result is Negative.  Fact Sheet for Patients:  BloggerCourse.com  Fact Sheet for Healthcare Providers:  SeriousBroker.it  This test is no  t yet approved or cleared by the [place] and  has been authorized for detection and/or diagnosis of SARS-CoV-2 by FDA under an Emergency Use Authorization (EUA). This EUA will remain  in effect (meaning this test can be used) for the duration of the COVID-19 declaration under Section 564(b)(1) of the Act, 21 U.S.C.section 360bbb-3(b)(1), unless the authorization is terminated  or revoked sooner.       Influenza A by PCR 10/07/2022 NEGATIVE  NEGATIVE Final   Influenza B by PCR 10/07/2022 NEGATIVE  NEGATIVE Final   Comment: (NOTE) The Xpert Xpress SARS-CoV-2/FLU/RSV plus assay is intended as an aid in the diagnosis of influenza from Nasopharyngeal swab specimens and should not be used as a sole basis for treatment. Nasal washings and aspirates are unacceptable for Xpert Xpress SARS-CoV-2/FLU/RSV testing.  Fact Sheet for Patients: BloggerCourse.com  Fact Sheet for Healthcare Providers: SeriousBroker.it  This test is not yet approved or cleared by the [place] FDA and has been authorized for detection and/or diagnosis of SARS-CoV-2 by FDA under an Emergency Use Authorization (EUA). This EUA will remain in effect (meaning this test can be used)  for the duration of the COVID-19 declaration under Section 564(b)(1) of the Act, 21 U.S.C. section 360bbb-3(b)(1), unless the authorization is terminated or revoked.     Resp Syncytial Virus by PCR 10/07/2022 NEGATIVE  NEGATIVE Final   Comment: (NOTE) Fact Sheet for Patients: BloggerCourse.com  Fact Sheet for Healthcare Providers: SeriousBroker.it  This test is not yet approved or cleared by the [place] FDA and has been authorized for detection and/or diagnosis of SARS-CoV-2 by FDA under an Emergency Use Authorization (EUA). This EUA will remain in effect (meaning this test can be used) for the duration of the COVID-19 declaration under Section 564(b)(1) of the Act, 21 U.S.C. section 360bbb-3(b)(1), unless the authorization is terminated or revoked.  Performed at [facility], [address], [place], [place] [address]    Specimen Description 10/07/2022 URINE, CLEAN CATCH   Final   Special Requests 10/07/2022    Final                   Value:NONE Performed at [facility], [address] [place], [place] [address]    Culture 10/07/2022 MULTIPLE SPECIES PRESENT, SUGGEST RECOLLECTION (A)   Final   Report Status 10/07/2022 10/08/2022 FINAL   Final  Admission on 07/25/2022, Discharged on 10/07/2022  Component Date Value Ref Range Status   SARS Coronavirus 2 by RT PCR 07/25/2022 NEGATIVE  NEGATIVE Final   Comment: (NOTE) SARS-CoV-2 target nucleic acids are NOT DETECTED.  The SARS-CoV-2 RNA is generally detectable in upper respiratory specimens during the acute phase of infection. The lowest concentration of SARS-CoV-2 viral copies this assay can detect is 138 copies/mL. A negative result does not preclude SARS-Cov-2 infection and should not be used as the sole basis for treatment or other patient management decisions. A negative result may occur with  improper specimen collection/handling, submission of  specimen other than nasopharyngeal swab, presence of viral mutation(s) within the areas targeted by this assay, and inadequate number of viral copies(<138 copies/mL). A negative result must be combined with clinical observations, patient history, and epidemiological information. The expected result is Negative.  Fact Sheet for Patients:  BloggerCourse.com  Fact Sheet for Healthcare Providers:  SeriousBroker.it  This test is no  t yet approved or cleared by the [place] and  has been authorized for detection and/or diagnosis of SARS-CoV-2 by FDA under an Emergency Use Authorization (EUA). This EUA will remain  in effect (meaning this test can be used) for the duration of the COVID-19 declaration under Section 564(b)(1) of the Act, 21 U.S.C.section 360bbb-3(b)(1), unless the authorization is terminated  or revoked sooner.       Influenza A by PCR 07/25/2022 NEGATIVE  NEGATIVE Final   Influenza B by PCR 07/25/2022 NEGATIVE  NEGATIVE Final   Comment: (NOTE) The Xpert Xpress SARS-CoV-2/FLU/RSV plus assay is intended as an aid in the diagnosis of influenza from Nasopharyngeal swab specimens and should not be used as a sole basis for treatment. Nasal washings and aspirates are unacceptable for Xpert Xpress SARS-CoV-2/FLU/RSV testing.  Fact Sheet for Patients: EntrepreneurPulse.com.au  Fact Sheet for Healthcare Providers: IncredibleEmployment.be  This test is not yet approved or cleared by the [place] FDA and has been authorized for detection and/or diagnosis of SARS-CoV-2 by FDA under an Emergency Use Authorization (EUA). This EUA will remain in effect (meaning this test can be used) for the duration of the COVID-19 declaration under Section 564(b)(1) of the Act, 21 U.S.C. section 360bbb-3(b)(1), unless the authorization is terminated  or revoked.  Performed at [facility], [place] [address], [place], [place] [address]    WBC 07/25/2022 8.3  4.0 - 10.5 K/uL Final   RBC 07/25/2022 4.44  3.87 - 5.11 MIL/uL Final   Hemoglobin 07/25/2022 13.7  12.0 - 15.0 g/dL Final   HCT 07/25/2022 40.2  36.0 - 46.0 % Final   MCV 07/25/2022 90.5  80.0 - 100.0 fL Final   MCH 07/25/2022 30.9  26.0 - 34.0 pg Final   MCHC 07/25/2022 34.1  30.0 - 36.0 g/dL Final   RDW 07/25/2022 12.2  11.5 - 15.5 % Final   Platelets 07/25/2022 248  150 - 400 K/uL Final   nRBC 07/25/2022 0.0  0.0 - 0.2 % Final   Neutrophils Relative % 07/25/2022 43  % Final   Neutro Abs 07/25/2022 3.6  1.7 - 7.7 K/uL Final   Lymphocytes Relative 07/25/2022 52  % Final   Lymphs Abs 07/25/2022 4.2 (H)  0.7 - 4.0 K/uL Final   Monocytes Relative 07/25/2022 5  % Final   Monocytes Absolute 07/25/2022 0.4  0.1 - 1.0 K/uL Final   Eosinophils Relative 07/25/2022 0  % Final   Eosinophils Absolute 07/25/2022 0.0  0.0 - 0.5 K/uL Final   Basophils Relative 07/25/2022 0  % Final   Basophils Absolute 07/25/2022 0.0  0.0 - 0.1 K/uL Final   Immature Granulocytes 07/25/2022 0  % Final   Abs Immature Granulocytes 07/25/2022 0.02  0.00 - 0.07 K/uL Final   Performed at [facility], [place] [address] [place], [place] [address]   Sodium 07/25/2022 138  135 - 145 mmol/L Final   Potassium 07/25/2022 4.0  3.5 - 5.1 mmol/L Final   Chloride 07/25/2022 104  98 - 111 mmol/L Final   CO2 07/25/2022 29  22 - 32 mmol/L Final   Glucose, Bld 07/25/2022 83  70 - 99 mg/dL Final   Glucose reference range applies only to samples taken after fasting for at least 8 hours.   BUN 07/25/2022 11  6 - 20 mg/dL Final   Creatinine, Ser 07/25/2022 0.97  0.44 - 1.00 mg/dL Final   Calcium 07/25/2022 9.2  8.9 - 10.3 mg/dL Final   Total  Protein 07/25/2022 7.0  6.5 - 8.1 g/dL Final   Albumin 56/21/308610/09/2022 3.8  3.5 - 5.0 g/dL Final   AST 57/84/696210/09/2022 18  15 - 41 U/L Final   ALT 07/25/2022 22  0 - 44 U/L Final    Alkaline Phosphatase 07/25/2022 64  38 - 126 U/L Final   Total Bilirubin 07/25/2022 0.2 (L)  0.3 - 1.2 mg/dL Final   GFR, Estimated 07/25/2022 >60  >60 mL/min Final   Comment: (NOTE) Calculated using the CKD-EPI Creatinine Equation (2021)    Anion gap 07/25/2022 5  5 - 15 Final   Performed at [facility], [address] [place], [place] [id number]   POC Amphetamine UR 07/25/2022 None Detected  NONE DETECTED (Cut Off Level 1000 ng/mL) Preliminary   POC Secobarbital (BAR) 07/25/2022 None Detected  NONE DETECTED (Cut Off Level 300 ng/mL) Preliminary   POC Buprenorphine (BUP) 07/25/2022 None Detected  NONE DETECTED (Cut Off Level 10 ng/mL) Preliminary   POC Oxazepam (BZO) 07/25/2022 None Detected  NONE DETECTED (Cut Off Level 300 ng/mL) Preliminary   POC Cocaine UR 07/25/2022 None Detected  NONE DETECTED (Cut Off Level 300 ng/mL) Preliminary   POC Methamphetamine UR 07/25/2022 None Detected  NONE DETECTED (Cut Off Level 1000 ng/mL) Preliminary   POC Morphine 07/25/2022 None Detected  NONE DETECTED (Cut Off Level 300 ng/mL) Preliminary   POC Methadone UR 07/25/2022 None Detected  NONE DETECTED (Cut Off Level 300 ng/mL) Preliminary   POC Oxycodone UR 07/25/2022 None Detected  NONE DETECTED (Cut Off Level 100 ng/mL) Preliminary   POC Marijuana UR 07/25/2022 None Detected  NONE DETECTED (Cut Off Level 50 ng/mL) Preliminary   SARSCOV2ONAVIRUS 2 AG 07/25/2022 NEGATIVE  NEGATIVE Final   Comment: (NOTE) SARS-CoV-2 antigen NOT DETECTED.   Negative results are presumptive.  Negative results do not preclude SARS-CoV-2 infection and should not be used as the sole basis for treatment or other patient management decisions, including infection  control decisions, particularly in the presence of clinical signs and  symptoms consistent with COVID-19, or in those who have been in contact with the virus.  Negative results must be combined with clinical observations, patient history, and  epidemiological information. The expected result is Negative.  Fact Sheet for Patients: https://www.jennings-kim.com/https://www.fda.gov/media/141569/download  Fact Sheet for Healthcare Providers: https://alexander-rogers.biz/https://www.fda.gov/media/141568/download  This test is not yet approved or cleared by the [place] FDA and  has been authorized for detection and/or diagnosis of SARS-CoV-2 by FDA under an Emergency Use Authorization (EUA).  This EUA will remain in effect (meaning this test can be used) for the duration of  the COV                          ID-19 declaration under Section 564(b)(1) of the Act, 21 U.S.C. section 360bbb-3(b)(1), unless the authorization is terminated or revoked sooner.     Cholesterol 07/25/2022 181  0 - 200 mg/dL Final   Triglycerides 13/24/401010/09/2022 118  <150 mg/dL Final   HDL 27/25/366410/09/2022 45  >40 mg/dL Final   Total CHOL/HDL Ratio 07/25/2022 4.0  RATIO Final   VLDL 07/25/2022 24  0 - 40 mg/dL Final   LDL Cholesterol 07/25/2022 112 (H)  0 - 99 mg/dL Final   Comment:        Total Cholesterol/HDL:CHD Risk Coronary Heart Disease Risk Table                     Men   Women  1/2 Average Risk   3.4   3.3  Average Risk       5.0   4.4  2 X Average Risk   9.6   7.1  3 X Average Risk  23.4   11.0        Use the calculated Patient Ratio above and the CHD Risk Table to determine the patient's CHD Risk.        ATP III CLASSIFICATION (LDL):  <100     mg/dL   Optimal  427-062  mg/dL   Near or Above                    Optimal  130-159  mg/dL   Borderline  376-283  mg/dL   High  >151     mg/dL   Very High Performed at [facility], [address] [place], [place] [address]    TSH 07/25/2022 6.668 (H)  0.350 - 4.500 uIU/mL Final   Comment: Performed by a 3rd Generation assay with a functional sensitivity of <=0.01 uIU/mL. Performed at [facility], [address], [place], [place] [address]    Glucose-Capillary 07/26/2022 104 (H)  70 - 99 mg/dL Final   Glucose reference range applies only to  samples taken after fasting for at least 8 hours.   T3, Free 07/31/2022 2.3  2.0 - 4.4 pg/mL Final   Comment: (NOTE) Performed At: [facility] [address] [place], [place] [id number] Jolene Schimke MD EV:[id number]    Free T4 07/31/2022 0.60 (L)  0.61 - 1.12 ng/dL Final   Comment: (NOTE) Biotin ingestion may interfere with free T4 tests. If the results are inconsistent with the TSH level, previous test results, or the clinical presentation, then consider biotin interference. If needed, order repeat testing after stopping biotin. Performed at [facility], [address] [place], [place] [address]    Glucose-Capillary 08/29/2022 100 (H)  70 - 99 mg/dL Final   Glucose reference range applies only to samples taken after fasting for at least 8 hours.   TSH 09/05/2022 0.793  0.350 - 4.500 uIU/mL Final   Comment: Performed by a 3rd Generation assay with a functional sensitivity of <=0.01 uIU/mL. Performed at [facility] [facility], [address] [place], [place] [address]    Free T4 09/05/2022 0.73  0.61 - 1.12 ng/dL Final   Comment: (NOTE) Biotin ingestion may interfere with free T4 tests. If the results are inconsistent with the TSH level, previous test results, or the clinical presentation, then consider biotin interference. If needed, order repeat testing after stopping biotin. Performed at [facility], [address], [place], [place] [address]    Preg Test, Ur 09/06/2022 Negative  Negative Final   Preg Test, Ur 09/06/2022 NEGATIVE  NEGATIVE Final   Comment:        THE SENSITIVITY OF THIS METHODOLOGY IS >24 mIU/mL    Preg Test, Ur 09/05/2022 NEGATIVE  NEGATIVE Final   Comment:        THE SENSITIVITY OF THIS METHODOLOGY IS >24 mIU/mL    Sodium 09/13/2022 136  135 - 145 mmol/L Final   Potassium 09/13/2022 4.5  3.5 - 5.1 mmol/L Final   Chloride 09/13/2022 105  98 - 111 mmol/L Final   CO2 09/13/2022 21 (L)  22 - 32 mmol/L Final   Glucose, Bld  09/13/2022 111 (H)  70 - 99 mg/dL Final   Glucose reference range applies only to samples taken  after fasting for at least 8 hours.   BUN 09/13/2022 14  6 - 20 mg/dL Final   Creatinine, Ser 09/13/2022 0.92  0.44 - 1.00 mg/dL Final   Calcium 59/56/3875 9.1  8.9 - 10.3 mg/dL Final   GFR, Estimated 09/13/2022 >60  >60 mL/min Final   Comment: (NOTE) Calculated using the CKD-EPI Creatinine Equation (2021)    Anion gap 09/13/2022 10  5 - 15 Final   Performed at [facility], [address], [place], [place] [address]   Vitamin B-12 09/13/2022 585  180 - 914 pg/mL Final   Comment: (NOTE) This assay is not validated for testing neonatal or myeloproliferative syndrome specimens for Vitamin B12 levels. Performed at [facility], [address] [place], [place] [address]    Color, Urine 09/14/2022 YELLOW  YELLOW Final   APPearance 09/14/2022 HAZY (A)  CLEAR Final   Specific Gravity, Urine 09/14/2022 1.025  1.005 - 1.030 Final   pH 09/14/2022 5.0  5.0 - 8.0 Final   Glucose, UA 09/14/2022 NEGATIVE  NEGATIVE mg/dL Final   Hgb urine dipstick 09/14/2022 NEGATIVE  NEGATIVE Final   Bilirubin Urine 09/14/2022 NEGATIVE  NEGATIVE Final   Ketones, ur 09/14/2022 NEGATIVE  NEGATIVE mg/dL Final   Protein, ur 41/66/0630 NEGATIVE  NEGATIVE mg/dL Final   Nitrite 16/10/930 NEGATIVE  NEGATIVE Final   Leukocytes,Ua 09/14/2022 TRACE (A)  NEGATIVE Final   RBC / HPF 09/14/2022 0-5  0 - 5 RBC/hpf Final   WBC, UA 09/14/2022 0-5  0 - 5 WBC/hpf Final   Bacteria, UA 09/14/2022 RARE (A)  NONE SEEN Final   Squamous Epithelial / HPF 09/14/2022 0-5  0 - 5 Final   Mucus 09/14/2022 PRESENT   Final   Performed at [facility], [address], [place], [place] [address]   Glucose-Capillary 09/16/2022 105 (H)  70 - 99 mg/dL Final   Glucose reference range applies only to samples taken after fasting for at least 8 hours.   SARS Coronavirus 2 by RT PCR 09/30/2022 NEGATIVE  NEGATIVE Final   Comment:  (NOTE) SARS-CoV-2 target nucleic acids are NOT DETECTED.  The SARS-CoV-2 RNA is generally detectable in upper respiratory specimens during the acute phase of infection. The lowest concentration of SARS-CoV-2 viral copies this assay can detect is 138 copies/mL. A negative result does not preclude SARS-Cov-2 infection and should not be used as the sole basis for treatment or other patient management decisions. A negative result may occur with  improper specimen collection/handling, submission of specimen other than nasopharyngeal swab, presence of viral mutation(s) within the areas targeted by this assay, and inadequate number of viral copies(<138 copies/mL). A negative result must be combined with clinical observations, patient history, and epidemiological information. The expected result is Negative.  Fact Sheet for Patients:  BloggerCourse.com  Fact Sheet for Healthcare Providers:  SeriousBroker.it  This test is no                          t yet approved or cleared by the [place] FDA and  has been authorized for detection and/or diagnosis of SARS-CoV-2 by FDA under an Emergency Use Authorization (EUA). This EUA will remain  in effect (meaning this test can be used) for the duration of the COVID-19 declaration under Section 564(b)(1) of the Act, 21 U.S.C.section 360bbb-3(b)(1), unless the authorization is terminated  or revoked sooner.       Influenza A by PCR 09/30/2022 NEGATIVE  NEGATIVE Final   Influenza B by PCR 09/30/2022 NEGATIVE  NEGATIVE Final   Comment: (NOTE) The Xpert Xpress SARS-CoV-2/FLU/RSV plus assay is intended as an aid in the diagnosis of influenza from Nasopharyngeal swab specimens and should not be used as a sole basis for treatment. Nasal washings and aspirates are unacceptable for Xpert Xpress SARS-CoV-2/FLU/RSV testing.  Fact Sheet for Patients: BloggerCourse.com  Fact  Sheet for Healthcare Providers: SeriousBroker.it  This test is not yet approved or cleared by the [place] FDA and has been authorized for detection and/or diagnosis of SARS-CoV-2 by FDA under an Emergency Use Authorization (EUA). This EUA will remain in effect (meaning this test can be used) for the duration of the COVID-19 declaration under Section 564(b)(1) of the Act, 21 U.S.C. section 360bbb-3(b)(1), unless the authorization is terminated or revoked.     Resp Syncytial Virus by PCR 09/30/2022 NEGATIVE  NEGATIVE Final   Comment: (NOTE) Fact Sheet for Patients: BloggerCourse.com  Fact Sheet for Healthcare Providers: SeriousBroker.it  This test is not yet approved or cleared by the [place] FDA and has been authorized for detection and/or diagnosis of SARS-CoV-2 by FDA under an Emergency Use Authorization (EUA). This EUA will remain in effect (meaning this test can be used) for the duration of the COVID-19 declaration under Section 564(b)(1) of the Act, 21 U.S.C. section 360bbb-3(b)(1), unless the authorization is terminated or revoked.  Performed at [facility], [address], [place], [place] [address]    Sodium 10/01/2022 136  135 - 145 mmol/L Final   Potassium 10/01/2022 3.8  3.5 - 5.1 mmol/L Final   Chloride 10/01/2022 104  98 - 111 mmol/L Final   CO2 10/01/2022 27  22 - 32 mmol/L Final   Glucose, Bld 10/01/2022 98  70 - 99 mg/dL Final   Glucose reference range applies only to samples taken after fasting for at least 8 hours.   BUN 10/01/2022 12  6 - 20 mg/dL Final   Creatinine, Ser 10/01/2022 0.98  0.44 - 1.00 mg/dL Final   Calcium 60/45/4098 8.9  8.9 - 10.3 mg/dL Final   GFR, Estimated 10/01/2022 >60  >60 mL/min Final   Comment: (NOTE) Calculated using the CKD-EPI Creatinine Equation (2021)    Anion gap 10/01/2022 5  5 - 15 Final   Performed at [facility],  [address] [place], [place] [address]   WBC 10/01/2022 5.1  4.0 - 10.5 K/uL Final   RBC 10/01/2022 4.31  3.87 - 5.11 MIL/uL Final   Hemoglobin 10/01/2022 12.7  12.0 - 15.0 g/dL Final   HCT 78/29/5621 38.8  36.0 - 46.0 % Final   MCV 10/01/2022 90.0  80.0 - 100.0 fL Final   MCH 10/01/2022 29.5  26.0 - 34.0 pg Final   MCHC 10/01/2022 32.7  30.0 - 36.0 g/dL Final   RDW 30/86/5784 12.4  11.5 - 15.5 % Final   Platelets 10/01/2022 300  150 - 400 K/uL Final   nRBC 10/01/2022 0.0  0.0 - 0.2 % Final   Performed at [facility], [address] [place], [place] [address]   Lipase 10/01/2022 29  11 - 51 U/L Final   Performed at [facility], [address], [place], [place] [address]   Color, Urine 10/05/2022 YELLOW  YELLOW Final   APPearance 10/05/2022 HAZY (A)  CLEAR Final   Specific Gravity, Urine 10/05/2022 1.018  1.005 - 1.030 Final   pH 10/05/2022 5.0  5.0 - 8.0  Final   Glucose, UA 10/05/2022 NEGATIVE  NEGATIVE mg/dL Final   Hgb urine dipstick 10/05/2022 NEGATIVE  NEGATIVE Final   Bilirubin Urine 10/05/2022 NEGATIVE  NEGATIVE Final   Ketones, ur 10/05/2022 NEGATIVE  NEGATIVE mg/dL Final   Protein, ur 16/07/9603 NEGATIVE  NEGATIVE mg/dL Final   Nitrite 54/06/8118 NEGATIVE  NEGATIVE Final   Leukocytes,Ua 10/05/2022 TRACE (A)  NEGATIVE Final   RBC / HPF 10/05/2022 0-5  0 - 5 RBC/hpf Final   WBC, UA 10/05/2022 0-5  0 - 5 WBC/hpf Final   Bacteria, UA 10/05/2022 RARE (A)  NONE SEEN Final   Squamous Epithelial / HPF 10/05/2022 0-5  0 - 5 Final   Mucus 10/05/2022 PRESENT   Final   Performed at [facility], [address], [place], [place] [address]   Specimen Description 10/05/2022 URINE, CLEAN CATCH   Final   Special Requests 10/05/2022    Final                   Value:NONE Performed at [facility], [address] [place], [place] [address]    Culture 10/05/2022 10,000 COLONIES/mL MULTIPLE SPECIES PRESENT, SUGGEST RECOLLECTION (A)   Final   Report Status 10/05/2022  10/07/2022 FINAL   Final  Admission on 07/04/2022, Discharged on 07/05/2022  Component Date Value Ref Range Status   Sodium 07/04/2022 142  135 - 145 mmol/L Final   Potassium 07/04/2022 4.4  3.5 - 5.1 mmol/L Final   Chloride 07/04/2022 109  98 - 111 mmol/L Final   CO2 07/04/2022 26  22 - 32 mmol/L Final   Glucose, Bld 07/04/2022 96  70 - 99 mg/dL Final   Glucose reference range applies only to samples taken after fasting for at least 8 hours.   BUN 07/04/2022 15  6 - 20 mg/dL Final   Creatinine, Ser 07/04/2022 0.83  0.44 - 1.00 mg/dL Final   Calcium 30/86/5784 9.3  8.9 - 10.3 mg/dL Final   Total Protein 69/62/9528 7.2  6.5 - 8.1 g/dL Final   Albumin 41/32/4401 3.9  3.5 - 5.0 g/dL Final   AST 02/72/5366 23  15 - 41 U/L Final   ALT 07/04/2022 28  0 - 44 U/L Final   Alkaline Phosphatase 07/04/2022 77  38 - 126 U/L Final   Total Bilirubin 07/04/2022 0.4  0.3 - 1.2 mg/dL Final   GFR, Estimated 07/04/2022 >60  >60 mL/min Final   Comment: (NOTE) Calculated using the CKD-EPI Creatinine Equation (2021)    Anion gap 07/04/2022 7  5 - 15 Final   Performed at [facility] [place], [address], [place], [place] [address]   Alcohol, Ethyl (B) 07/04/2022 <10  <10 mg/dL Final   Comment: (NOTE) Lowest detectable limit for serum alcohol is 10 mg/dL.  For medical purposes only. Performed at [facility] [place], [address], [place], [place] [address]    WBC 07/04/2022 7.0  4.0 - 10.5 K/uL Final   RBC 07/04/2022 4.18  3.87 - 5.11 MIL/uL Final   Hemoglobin 07/04/2022 12.8  12.0 - 15.0 g/dL Final   HCT 56/38/7564 39.1  36.0 - 46.0 % Final   MCV 07/04/2022 93.5  80.0 - 100.0 fL Final   MCH 07/04/2022 30.6  26.0 - 34.0 pg Final   MCHC 07/04/2022 32.7  30.0 - 36.0 g/dL Final   RDW 33/29/5188 12.9  11.5 - 15.5 % Final   Platelets 07/04/2022 243  150 - 400 K/uL Final  nRBC 07/04/2022 0.0  0.0 - 0.2 % Final   Neutrophils Relative % 07/04/2022 43  % Final   Neutro Abs  07/04/2022 3.0  1.7 - 7.7 K/uL Final   Lymphocytes Relative 07/04/2022 50  % Final   Lymphs Abs 07/04/2022 3.5  0.7 - 4.0 K/uL Final   Monocytes Relative 07/04/2022 7  % Final   Monocytes Absolute 07/04/2022 0.5  0.1 - 1.0 K/uL Final   Eosinophils Relative 07/04/2022 0  % Final   Eosinophils Absolute 07/04/2022 0.0  0.0 - 0.5 K/uL Final   Basophils Relative 07/04/2022 0  % Final   Basophils Absolute 07/04/2022 0.0  0.0 - 0.1 K/uL Final   Immature Granulocytes 07/04/2022 0  % Final   Abs Immature Granulocytes 07/04/2022 0.01  0.00 - 0.07 K/uL Final   Performed at [facility], [address], [place], [place] [address]   I-stat hCG, quantitative 07/04/2022 <5.0  <5 mIU/mL Final   Comment 3 07/04/2022          Final   Comment:   GEST. AGE      CONC.  (mIU/mL)   <=1 WEEK        5 - 50     2 WEEKS       50 - 500     3 WEEKS       100 - 10,000     4 WEEKS     1,000 - 30,000        FEMALE AND NON-PREGNANT FEMALE:     LESS THAN 5 mIU/mL   Admission on 06/14/2022, Discharged on 06/14/2022  Component Date Value Ref Range Status   Color, UA 06/14/2022 yellow  yellow Final   Clarity, UA 06/14/2022 cloudy (A)  clear Final   Glucose, UA 06/14/2022 negative  negative mg/dL Final   Bilirubin, UA 60/45/4098 negative  negative Final   Ketones, POC UA 06/14/2022 negative  negative mg/dL Final   Spec Grav, UA 11/91/4782 1.025  1.010 - 1.025 Final   Blood, UA 06/14/2022 negative  negative Final   pH, UA 06/14/2022 7.5  5.0 - 8.0 Final   Protein Ur, POC 06/14/2022 =30 (A)  negative mg/dL Final   Urobilinogen, UA 06/14/2022 0.2  0.2 or 1.0 E.U./dL Final   Nitrite, UA 95/62/1308 Negative  Negative Final   Leukocytes, UA 06/14/2022 Negative  Negative Final   Preg Test, Ur 06/14/2022 Negative  Negative Final   Specimen Description 06/14/2022 URINE, CLEAN CATCH   Final   Special Requests 06/14/2022    Final                   Value:NONE Performed at  Hospital Lab, [address] [place], [place] [address]    Culture 06/14/2022 MULTIPLE SPECIES PRESENT, SUGGEST RECOLLECTION (A)   Final   Report Status 06/14/2022 06/16/2022 FINAL   Final  Admission on 06/07/2022, Discharged on 06/10/2022  Component Date Value Ref Range Status   SARS Coronavirus 2 by RT PCR 06/07/2022 NEGATIVE  NEGATIVE Final   Comment: (NOTE) SARS-CoV-2 target nucleic acids are NOT DETECTED.  The SARS-CoV-2 RNA is generally detectable in upper respiratory specimens during the acute phase of infection. The lowest concentration of SARS-CoV-2 viral copies this assay can detect is 138 copies/mL. A negative result does not preclude SARS-Cov-2 infection and should not be used as the sole basis for treatment or other patient management decisions. A negative result may occur with  improper specimen collection/handling, submission of  specimen other than nasopharyngeal swab, presence of viral mutation(s) within the areas targeted by this assay, and inadequate number of viral copies(<138 copies/mL). A negative result must be combined with clinical observations, patient history, and epidemiological information. The expected result is Negative.  Fact Sheet for Patients:  BloggerCourse.comhttps://www.fda.gov/media/152166/download  Fact Sheet for Healthcare Providers:  SeriousBroker.ithttps://www.fda.gov/media/152162/download  This test is no                          t yet approved or cleared by the [place] FDA and  has been authorized for detection and/or diagnosis of SARS-CoV-2 by FDA under an Emergency Use Authorization (EUA). This EUA will remain  in effect (meaning this test can be used) for the duration of the COVID-19 declaration under Section 564(b)(1) of the Act, 21 U.S.C.section 360bbb-3(b)(1), unless the authorization is terminated  or revoked sooner.       Influenza A by PCR 06/07/2022 NEGATIVE  NEGATIVE Final   Influenza B by PCR 06/07/2022 NEGATIVE  NEGATIVE Final   Comment: (NOTE) The Xpert Xpress  SARS-CoV-2/FLU/RSV plus assay is intended as an aid in the diagnosis of influenza from Nasopharyngeal swab specimens and should not be used as a sole basis for treatment. Nasal washings and aspirates are unacceptable for Xpert Xpress SARS-CoV-2/FLU/RSV testing.  Fact Sheet for Patients: BloggerCourse.comhttps://www.fda.gov/media/152166/download  Fact Sheet for Healthcare Providers: SeriousBroker.ithttps://www.fda.gov/media/152162/download  This test is not yet approved or cleared by the [place] FDA and has been authorized for detection and/or diagnosis of SARS-CoV-2 by FDA under an Emergency Use Authorization (EUA). This EUA will remain in effect (meaning this test can be used) for the duration of the COVID-19 declaration under Section 564(b)(1) of the Act, 21 U.S.C. section 360bbb-3(b)(1), unless the authorization is terminated or revoked.  Performed at [facility], [address] [place], [place] [address]    WBC 06/07/2022 5.7  4.0 - 10.5 K/uL Final   RBC 06/07/2022 4.39  3.87 - 5.11 MIL/uL Final   Hemoglobin 06/07/2022 13.2  12.0 - 15.0 g/dL Final   HCT 81/19/147808/25/2023 40.4  36.0 - 46.0 % Final   MCV 06/07/2022 92.0  80.0 - 100.0 fL Final   MCH 06/07/2022 30.1  26.0 - 34.0 pg Final   MCHC 06/07/2022 32.7  30.0 - 36.0 g/dL Final   RDW 29/56/213008/25/2023 12.4  11.5 - 15.5 % Final   Platelets 06/07/2022 308  150 - 400 K/uL Final   nRBC 06/07/2022 0.0  0.0 - 0.2 % Final   Neutrophils Relative % 06/07/2022 42  % Final   Neutro Abs 06/07/2022 2.4  1.7 - 7.7 K/uL Final   Lymphocytes Relative 06/07/2022 54  % Final   Lymphs Abs 06/07/2022 3.1  0.7 - 4.0 K/uL Final   Monocytes Relative 06/07/2022 4  % Final   Monocytes Absolute 06/07/2022 0.2  0.1 - 1.0 K/uL Final   Eosinophils Relative 06/07/2022 0  % Final   Eosinophils Absolute 06/07/2022 0.0  0.0 - 0.5 K/uL Final   Basophils Relative 06/07/2022 0  % Final   Basophils Absolute 06/07/2022 0.0  0.0 - 0.1 K/uL Final   Immature Granulocytes 06/07/2022 0  %  Final   Abs Immature Granulocytes 06/07/2022 0.01  0.00 - 0.07 K/uL Final   Performed at [facility], [address] [place], [place] [address]   Sodium 06/07/2022 139  135 - 145 mmol/L Final   Potassium 06/07/2022 4.0  3.5 - 5.1 mmol/L Final  Chloride 06/07/2022 104  98 - 111 mmol/L Final   CO2 06/07/2022 28  22 - 32 mmol/L Final   Glucose, Bld 06/07/2022 104 (H)  70 - 99 mg/dL Final   Glucose reference range applies only to samples taken after fasting for at least 8 hours.   BUN 06/07/2022 8  6 - 20 mg/dL Final   Creatinine, Ser 06/07/2022 0.84  0.44 - 1.00 mg/dL Final   Calcium 16/07/9603 9.1  8.9 - 10.3 mg/dL Final   Total Protein 54/06/8118 6.9  6.5 - 8.1 g/dL Final   Albumin 14/78/2956 3.7  3.5 - 5.0 g/dL Final   AST 21/30/8657 19  15 - 41 U/L Final   ALT 06/07/2022 22  0 - 44 U/L Final   Alkaline Phosphatase 06/07/2022 54  38 - 126 U/L Final   Total Bilirubin 06/07/2022 0.4  0.3 - 1.2 mg/dL Final   GFR, Estimated 06/07/2022 >60  >60 mL/min Final   Comment: (NOTE) Calculated using the CKD-EPI Creatinine Equation (2021)    Anion gap 06/07/2022 7  5 - 15 Final   Performed at [facility], [address] [place], [place] [address]   Hgb A1c MFr Bld 06/07/2022 5.0  4.8 - 5.6 % Final   Comment: (NOTE) Pre diabetes:          5.7%-6.4%  Diabetes:              >6.4%  Glycemic control for   <7.0% adults with diabetes    Mean Plasma Glucose 06/07/2022 96.8  mg/dL Final   Performed at [facility], [address] [place], [place] [address]   TSH 06/07/2022 1.620  0.350 - 4.500 uIU/mL Final   Comment: Performed by a 3rd Generation assay with a functional sensitivity of <=0.01 uIU/mL. Performed at [facility], [address] [place], [place] [address]    RPR Ser Ql 06/07/2022 NON REACTIVE  NON REACTIVE Final   Performed at [facility], [address], [place], [place] [address]   Color, Urine 06/07/2022 YELLOW  YELLOW Final   APPearance  06/07/2022 HAZY (A)  CLEAR Final   Specific Gravity, Urine 06/07/2022 1.018  1.005 - 1.030 Final   pH 06/07/2022 7.0  5.0 - 8.0 Final   Glucose, UA 06/07/2022 NEGATIVE  NEGATIVE mg/dL Final   Hgb urine dipstick 06/07/2022 NEGATIVE  NEGATIVE Final   Bilirubin Urine 06/07/2022 NEGATIVE  NEGATIVE Final   Ketones, ur 06/07/2022 NEGATIVE  NEGATIVE mg/dL Final   Protein, ur 72/53/6644 NEGATIVE  NEGATIVE mg/dL Final   Nitrite 03/47/4259 NEGATIVE  NEGATIVE Final   Leukocytes,Ua 06/07/2022 NEGATIVE  NEGATIVE Final   Performed at [facility], [address], [place], [place] [address]   Cholesterol 06/07/2022 164  0 - 200 mg/dL Final   Triglycerides 56/43/3295 158 (H)  <150 mg/dL Final   HDL 18/84/1660 44  >40 mg/dL Final   Total CHOL/HDL Ratio 06/07/2022 3.7  RATIO Final   VLDL 06/07/2022 32  0 - 40 mg/dL Final   LDL Cholesterol 06/07/2022 88  0 - 99 mg/dL Final   Comment:        Total Cholesterol/HDL:CHD Risk Coronary Heart Disease Risk Table                     Men   Women  1/2 Average Risk   3.4   3.3  Average Risk       5.0   4.4  2 X Average Risk   9.6   7.1  3 X Average Risk  23.4   11.0        Use the calculated Patient Ratio above and the CHD Risk Table to determine the patient's CHD Risk.        ATP III CLASSIFICATION (LDL):  <100     mg/dL   Optimal  161-096  mg/dL   Near or Above                    Optimal  130-159  mg/dL   Borderline  045-409  mg/dL   High  >811     mg/dL   Very High Performed at [facility], [address], [place], [place] [address]    HIV Screen 4th Generation wRfx 06/07/2022 Non Reactive  Non Reactive Final   Performed at [facility], [address], [place], [place] [address]   SARSCOV2ONAVIRUS 2 AG 06/07/2022 NEGATIVE  NEGATIVE Final   Comment: (NOTE) SARS-CoV-2 antigen NOT DETECTED.   Negative results are presumptive.  Negative results do not preclude SARS-CoV-2 infection and should not be used as the sole basis  for treatment or other patient management decisions, including infection  control decisions, particularly in the presence of clinical signs and  symptoms consistent with COVID-19, or in those who have been in contact with the virus.  Negative results must be combined with clinical observations, patient history, and epidemiological information. The expected result is Negative.  Fact Sheet for Patients: https://www.jennings-kim.com/  Fact Sheet for Healthcare Providers: https://alexander-rogers.biz/  This test is not yet approved or cleared by the [place] FDA and  has been authorized for detection and/or diagnosis of SARS-CoV-2 by FDA under an Emergency Use Authorization (EUA).  This EUA will remain in effect (meaning this test can be used) for the duration of  the COV                          ID-19 declaration under Section 564(b)(1) of the Act, 21 U.S.C. section 360bbb-3(b)(1), unless the authorization is terminated or revoked sooner.     POC Amphetamine UR 06/07/2022 None Detected  NONE DETECTED (Cut Off Level 1000 ng/mL) Final   POC Secobarbital (BAR) 06/07/2022 None Detected  NONE DETECTED (Cut Off Level 300 ng/mL) Final   POC Buprenorphine (BUP) 06/07/2022 None Detected  NONE DETECTED (Cut Off Level 10 ng/mL) Final   POC Oxazepam (BZO) 06/07/2022 None Detected  NONE DETECTED (Cut Off Level 300 ng/mL) Final   POC Cocaine UR 06/07/2022 None Detected  NONE DETECTED (Cut Off Level 300 ng/mL) Final   POC Methamphetamine UR 06/07/2022 None Detected  NONE DETECTED (Cut Off Level 1000 ng/mL) Final   POC Morphine 06/07/2022 None Detected  NONE DETECTED (Cut Off Level 300 ng/mL) Final   POC Methadone UR 06/07/2022 None Detected  NONE DETECTED (Cut Off Level 300 ng/mL) Final   POC Oxycodone UR 06/07/2022 None Detected  NONE DETECTED (Cut Off Level 100 ng/mL) Final   POC Marijuana UR 06/07/2022 None Detected  NONE DETECTED (Cut Off Level 50 ng/mL) Final   Preg  Test, Ur 06/08/2022 NEGATIVE  NEGATIVE Final   Comment:        THE SENSITIVITY OF THIS METHODOLOGY IS >20 mIU/mL. Performed at [facility], [address], [place], [place] [address]    Preg Test, Ur 06/08/2022 NEGATIVE  NEGATIVE Final   Comment:  THE SENSITIVITY OF THIS METHODOLOGY IS >24 mIU/mL     Blood Alcohol level:  Lab Results  Component Value Date   ETH <10 07/04/2022   ETH <10 02/07/2022    Metabolic Disorder Labs: Lab Results  Component Value Date   HGBA1C 5.7 (H) 10/15/2022   MPG 117 10/15/2022   MPG 96.8 06/07/2022   No results found for: "PROLACTIN" Lab Results  Component Value Date   CHOL 181 07/25/2022   TRIG 118 07/25/2022   HDL 45 07/25/2022   CHOLHDL 4.0 07/25/2022   VLDL 24 07/25/2022   LDLCALC 112 (H) 07/25/2022   LDLCALC 88 06/07/2022    Therapeutic Lab Levels: No results found for: "LITHIUM" No results found for: "VALPROATE" No results found for: "CBMZ"  Physical Findings   GAD-7    Flowsheet Row [facility]17/2021 [facility]  Total GAD-7 Score 17      PHQ2-9    Flowsheet Row ED from 10/07/2022 in [facility] ED from 06/07/2022 in [facility] Office Visit from 12/17/2021 in [facility]  PHQ-2 Total Score 0 1 2  PHQ-9 Total Score -- 2 8      Flowsheet Row ED from 10/07/2022 in [facility] Most recent reading at 10/27/2022  9:31 AM ED from 10/07/2022 in [facility] EMERGENCY DEPARTMENT Most recent reading at 10/07/2022 12:18 PM ED from 07/25/2022 in [facility] Most recent reading at 10/06/2022  9:38 AM  C-SSRS RISK CATEGORY No Risk No Risk No Risk        Musculoskeletal  Strength & Muscle Tone: within normal limits Gait & Station: normal Patient leans: N/A  Psychiatric Specialty Exam  Presentation  General  Appearance:  Appropriate for Environment  Eye Contact: Fair  Speech: Clear and Coherent  Speech Volume: Normal  Handedness: Right   Mood and Affect  Mood: Euthymic  Affect: Appropriate   Thought Process  Thought Processes: Coherent  Descriptions of Associations:Intact  Orientation:Full (Time, Place and Person)  Thought Content:WDL  Diagnosis of Schizophrenia or Schizoaffective disorder in past: Yes  Duration of Psychotic Symptoms: Greater than six months   Hallucinations:Hallucinations: Auditory Description of Auditory Hallucinations: "in my sleep sometimes" Description of Visual Hallucinations: Patient denies  Ideas of Reference:None  Suicidal Thoughts:Suicidal Thoughts: No  Homicidal Thoughts:Homicidal Thoughts: No   Sensorium  Memory: Immediate Fair; Recent Fair  Judgment: Fair  Insight: Fair   Art therapist  Concentration: Fair  Attention Span: Fair  Recall: Fair  Fund of Knowledge: Fair  Language: Good   Psychomotor Activity  Psychomotor Activity: Psychomotor Activity: Normal   Assets  Assets: Communication Skills; Resilience; Desire for Improvement   Sleep  Sleep: Sleep: Good Number of Hours of Sleep: 9   Nutritional Assessment (For OBS and [facility]BC admissions only) Has the patient had a weight loss or gain of 10 pounds or more in the last 3 months?: No Has the patient had a decrease in food intake/or appetite?: No Does the patient have dental problems?: No Does the patient have eating habits or behaviors that may be indicators of an eating disorder including binging or inducing vomiting?: No Has the patient recently lost weight without trying?: 0 Has the patient been eating poorly because of a decreased appetite?: 0 Malnutrition Screening Tool Score: 0    Physical Exam  Physical Exam Vitals reviewed.  Constitutional:      Appearance:  Normal appearance.  Eyes:     General:        Right eye: No  discharge.        Left eye: No discharge.  Cardiovascular:     Rate and Rhythm: Normal rate.     Pulses: Normal pulses.  Pulmonary:     Effort: No respiratory distress.     Breath sounds: No wheezing.  Neurological:     Mental Status: She is alert and oriented to person, place, and time.     Motor: No weakness.  Psychiatric:        Mood and Affect: Mood normal.        Speech: Speech normal.        Thought Content: Thought content does not include homicidal or suicidal ideation.    Review of Systems  Constitutional:  Negative for fever.  Respiratory:  Negative for cough, shortness of breath and wheezing.   Cardiovascular:  Negative for chest pain and palpitations.  Gastrointestinal:  Negative for nausea and vomiting.  Neurological:  Negative for dizziness, seizures, weakness and headaches.  Psychiatric/Behavioral:  Positive for hallucinations. Negative for depression and suicidal ideas.    Blood pressure 95/70, pulse 90, temperature 97.8 F (36.6 C), temperature source Oral, resp. rate 16, height 5\' 1"  (1.549 m), weight 198 lb (89.8 kg), SpO2 99 %. Body mass index is 37.41 kg/m.  Treatment Plan Summary: Daily contact with patient to assess and evaluate symptoms and progress in treatment and Medication management.  Patient is awaiting group home placement on Monday 22, 2024. She will move into [facility].   Adin Hector, NP 10/29/2022 9:32 AM

## 2022-10-29 NOTE — ED Notes (Signed)
Pt sleeping@this time. Breathing even and unlabored. Will continue to monitor for safety 

## 2022-10-29 NOTE — Progress Notes (Signed)
[name] Progress Note   [name] spoke with the pt regarding information that was passed on at shift change. Pt stated she confirmed with her lawyer that she wanted to proceed with the guardianship hearing and have her father take over guardianship. She informed [name] of a placement she found that had a bed available for her. Pt appeared to be concerned that she will have to leave the facility before being placed in a home. [name] reassured her that staff is working diligently with Venetia Constable to ensure she is placed.    Etna Green Grandfather [place], [place], [address] [phone] phone   Omelia Blackwater, MSW, [place] phone [phone] fax

## 2022-10-29 NOTE — ED Notes (Addendum)
Pt watching television at this hour. No apparent distress. RR even and unlabored. Pt says she was sad and mad earlier today when she was told that she would not be going to the group home on Monday 1/22. Pt given support and encouragement. Denies SI, HI, AVH. Monitored for safety.

## 2022-10-29 NOTE — Care Management (Signed)
OBS Care Management    Treatment Team Meeting    In attandance: Farrel Gobble - APS Worker [name] - Patient  [name] - [place]     Nicole [name]'s projected discharge date has been pushed back due to the Care Manager waiting to hear back from her leadership regarding her diagnosis of IDD.   [name] will be reaching out to [name] to see if he can identify some where [name] can go pending placement to [facility].   [name] reports that her  sister in law phone number [name] [phone] may be an option for her to stay with for respite until her placement has with [place] has been finalized.   The guardian at [place] has not contacted [name] regarding her upcoming court date on 10-30-2022.Writer left a Dance movement psychotherapist for the lawyer [name].   [name] will contact other mental health providers that may be bale to accept the patient    The next Treatment Team meeting will be on 10-31-2022 (Thursday) at 12pm

## 2022-10-29 NOTE — Progress Notes (Signed)
Received Nicole [name] this AM asleep in her chair bed, she woke up on her own. She received breakfast and afterwards was compliant with her medications. She took a shower  and received her lidocaine patch. She denied all of the psychiatric symptoms.

## 2022-10-30 DIAGNOSIS — F419 Anxiety disorder, unspecified: Secondary | ICD-10-CM | POA: Diagnosis not present

## 2022-10-30 DIAGNOSIS — N9489 Other specified conditions associated with female genital organs and menstrual cycle: Secondary | ICD-10-CM | POA: Diagnosis not present

## 2022-10-30 DIAGNOSIS — E039 Hypothyroidism, unspecified: Secondary | ICD-10-CM | POA: Diagnosis not present

## 2022-10-30 DIAGNOSIS — Q860 Fetal alcohol syndrome (dysmorphic): Secondary | ICD-10-CM | POA: Insufficient documentation

## 2022-10-30 DIAGNOSIS — F333 Major depressive disorder, recurrent, severe with psychotic symptoms: Secondary | ICD-10-CM | POA: Diagnosis not present

## 2022-10-30 DIAGNOSIS — Z1152 Encounter for screening for COVID-19: Secondary | ICD-10-CM | POA: Diagnosis not present

## 2022-10-30 DIAGNOSIS — E118 Type 2 diabetes mellitus with unspecified complications: Secondary | ICD-10-CM | POA: Diagnosis not present

## 2022-10-30 DIAGNOSIS — N390 Urinary tract infection, site not specified: Secondary | ICD-10-CM | POA: Diagnosis not present

## 2022-10-30 DIAGNOSIS — F209 Schizophrenia, unspecified: Secondary | ICD-10-CM | POA: Diagnosis not present

## 2022-10-30 DIAGNOSIS — F17200 Nicotine dependence, unspecified, uncomplicated: Secondary | ICD-10-CM | POA: Diagnosis not present

## 2022-10-30 NOTE — ED Notes (Signed)
Writer went to talk with patient. Patient states "I'm lonely and I miss my friend". Patient states I tried to call my father several time he didn't answer. Patient became tearful. Writer provided encouragement and support Safety maintain.

## 2022-10-30 NOTE — ED Notes (Signed)
Patient eating lunch. Respirations equal and unlabored, skin warm and dry, NAD. Routine safety checks conducted according to facility protocol. Will continue to monitor for safety.  

## 2022-10-30 NOTE — ED Provider Notes (Signed)
Behavioral Health Progress Note  Date and Time: 10/30/2022 8:31 AM Name: Nicole MassonLatasha Pouncey MRN:  [id number]  Subjective:   Patient was initially seen sleeping, no acute distress. She was easily awaken. Patient continues to report back discomfort due to poor padding. Otherwise she has no other questions or concerns. Continues to tolerate medications well, no side effects. Reported mood is "Nicole but good". Her sleep and appetite remains stable. She denied SI/HI/AVH, paranoia and contracted to safety.   Brief HPI: Nicole MassonLatasha Poncedeleon 32 y.o., female patient with schizophrenia, MDD with psychosis, [facility], voluntarily, as a walk-in accompanied by [employer] after a disagreement and verbal altercation with personnel at the group home. She has been dismissed previously from other group homes due to threatening behaviors and suicidal ideation. Patient was admitted to continuous assessment unit on 07/25/2022 as she also endorsed passive SI at that time along with symptoms of visual hallucination. Patient was subsequently psychiatrically cleared during her admission to the continuous assessment unit. However patient remains [facility] at [facility] bordering until adequate- residential placement can be obtained.      Legal Guardian: [name](Maxine Brown) transitioning to be [name](Albert Brown) Point of contact: [name](Albert Brown).    Diagnosis:  Final diagnoses:  Tobacco use disorder  Schizophrenia, unspecified type (HCC)  MDD (major depressive disorder), recurrent episode, moderate (HCC)  Hypothyroidism, unspecified type    Total Time spent with patient: 20 minutes  Past Psychiatric History: Schizophrenia unspecified, Major Depressive Disorder recurrent moderate, suicidal ideation, tobacco use disorder, borderline intellectual functioning.  Past Medical History:  Past Medical History:  Diagnosis Date   Anxiety    Depression    Hypothyroidism 08/07/2022   Tobacco use disorder 08/07/2022    Past Surgical  History:  Procedure Laterality Date   WISDOM TOOTH EXTRACTION Bilateral 2020   Family History:  Family History  Problem Relation Age of Onset   Hypertension Father    Diabetes Father    Family Psychiatric  History: No history reported Social History:  Social History   Substance and Sexual Activity  Alcohol Use Never     Social History   Substance and Sexual Activity  Drug Use Never    Social History   Socioeconomic History   Marital status: Single    Spouse name: Not on file   Number of children: Not on file   Years of education: Not on file   Highest education level: Not on file  Occupational History   Not on file  Tobacco Use   Smoking status: Every Day    Types: Cigarettes   Smokeless tobacco: Never  Vaping Use   Vaping Use: Some days  Substance and Sexual Activity   Alcohol use: Never   Drug use: Never   Sexual activity: Yes    Partners: Female    Birth control/protection: Implant  Other Topics Concern   Not on file  Social History Narrative   Not on file   Social Determinants of Health   Financial Resource Strain: Not on file  Food Insecurity: Not on file  Transportation Needs: Not on file  Physical Activity: Not on file  Stress: Not on file  Social Connections: Not on file   SDOH:  SDOH Screenings   Depression (PHQ2-9): Low Risk  (10/28/2022)  Tobacco Use: High Risk (10/21/2022)   Additional Social History:                         Sleep: Peri JeffersonGood  Appetite:  Good  Current Medications:  Current Facility-Administered Medications  Medication Dose Route Frequency Provider Last Rate Last Admin   acetaminophen (TYLENOL) tablet 650 mg  650 mg Oral Q6H PRN Marlou SaByungura, Veronique M, NP   650 mg at 10/28/22 1054   alum & mag hydroxide-simeth (MAALOX/MYLANTA) 200-200-20 MG/5ML suspension 30 mL  30 mL Oral Q4H PRN Marlou SaByungura, Veronique M, NP   30 mL at 10/26/22 1755   ARIPiprazole ER (ABILIFY MAINTENA) 400 MG prefilled syringe 400 mg  400 mg  Intramuscular Q28 days Princess BruinsNguyen, Deanie Jupiter, DO   400 mg at 10/25/22 0920   cetaphil lotion   Topical PRN Princess BruinsNguyen, Meldon Hanzlik, DO       fluticasone [facility](FLONASE) 50 MCG/ACT nasal spray 1 spray  1 spray Each Nare QHS Princess BruinsNguyen, Kalana Yust, DO   1 spray at 10/29/22 2114   hydrOXYzine (ATARAX) tablet 25 mg  25 mg Oral TID PRN Lorri Frederickarrion-Carrero, Margely, MD   25 mg at 10/29/22 2115   ketoconazole (NIZORAL) 2 % cream   Topical BID Princess BruinsNguyen, Kariyah Baugh, DO   Given at 10/29/22 2114   levothyroxine (SYNTHROID) tablet 100 mcg  100 mcg Oral Once Byungura, Veronique M, NP       levothyroxine (SYNTHROID) tablet 100 mcg  100 mcg Oral Q0600 Marlou SaByungura, Veronique M, NP   100 mcg at 10/30/22 0619   lidocaine (LIDODERM) 5 % 1 patch  1 patch Transdermal Q24H Princess BruinsNguyen, Maira Christon, DO   1 patch at 10/29/22 0949   loratadine (CLARITIN) tablet 10 mg  10 mg Oral Daily Carrion-Carrero, Karle StarchMargely, MD   10 mg at 10/29/22 0911   magnesium hydroxide (MILK OF MAGNESIA) suspension 30 mL  30 mL Oral Daily PRN Marlou SaByungura, Veronique M, NP   30 mL at 10/12/22 2145   metFORMIN (GLUCOPHAGE) tablet 500 mg  500 mg Oral Q breakfast Carrion-Carrero, Karle StarchMargely, MD   500 mg at 10/30/22 [id number]   nicotine (NICODERM CQ - dosed in mg/24 hours) patch 14 mg  14 mg Transdermal Daily PRN Carrion-Carrero, Karle StarchMargely, MD       ondansetron (ZOFRAN-ODT) disintegrating tablet 4 mg  4 mg Oral Q8H PRN Carrion-Carrero, Margely, MD   4 mg at 10/22/22 1806   Oxcarbazepine (TRILEPTAL) tablet 300 mg  300 mg Oral BID Byungura, Veronique M, NP   300 mg at 10/29/22 2116   pantoprazole (PROTONIX) EC tablet 40 mg  40 mg Oral Daily Carrion-Carrero, Karle StarchMargely, MD   40 mg at 10/29/22 0912   polyethylene glycol (MIRALAX / GLYCOLAX) packet 17 g  17 g Oral Daily Princess BruinsNguyen, Aditya Nastasi, DO   17 g at 10/29/22 0913   QUEtiapine (SEROQUEL) tablet 400 mg  400 mg Oral BID Rayburn GoByungura, Veronique M, NP   400 mg at 10/29/22 2115   sertraline (ZOLOFT) tablet 150 mg  150 mg Oral Daily Carrion-Carrero, Margely, MD   150 mg at 10/29/22 0911   sodium  chloride (OCEAN) 0.65 % nasal spray 1 spray  1 spray Each Nare Daily Princess BruinsNguyen, Honest Safranek, DO   1 spray at 10/29/22 0912   traZODone (DESYREL) tablet 100 mg  100 mg Oral QHS Olin PiaByungura, Veronique M, NP   100 mg at 10/29/22 2115   traZODone (DESYREL) tablet 50 mg  50 mg Oral QHS PRN Marlou SaByungura, Veronique M, NP   50 mg at 10/28/22 2105   valACYclovir (VALTREX) tablet 500 mg  500 mg Oral Daily Carrion-Carrero, Karle StarchMargely, MD   500 mg at 10/29/22 0911   zinc oxide (BALMEX) 11.3 % cream   Topical PRN Cyndie ChimeNguyen,  Raynelle Fanning, DO       Current Outpatient Medications  Medication Sig Dispense Refill   ABILIFY MAINTENA 400 MG PRSY prefilled syringe 400 mg every 28 (twenty-eight) days.     cetirizine (ZYRTEC) 10 MG tablet Take 10 mg by mouth daily.     cyclobenzaprine (FLEXERIL) 10 MG tablet Take 1 tablet (10 mg total) by mouth 2 (two) times daily as needed for muscle spasms. 20 tablet 0   fluticasone (FLONASE) 50 MCG/ACT nasal spray Place 1 spray into both nostrils daily.     meloxicam (MOBIC) 15 MG tablet Take 15 mg by mouth daily.     nitrofurantoin, macrocrystal-monohydrate, (MACROBID) 100 MG capsule Take 1 capsule (100 mg total) by mouth 2 (two) times daily. 10 capsule 0   ondansetron (ZOFRAN-ODT) 4 MG disintegrating tablet Take 1 tablet (4 mg total) by mouth every 8 (eight) hours as needed for nausea or vomiting. 20 tablet 0   Oxcarbazepine (TRILEPTAL) 300 MG tablet Take 1 tablet (300 mg total) by mouth 2 (two) times daily. 60 tablet 0   QUEtiapine (SEROQUEL) 400 MG tablet Take 1 tablet (400 mg total) by mouth 2 (two) times daily. 60 tablet 0   sertraline (ZOLOFT) 50 MG tablet Take 3 tablets (150 mg total) by mouth in the morning. 90 tablet 0   traZODone (DESYREL) 100 MG tablet Take 1 tablet (100 mg total) by mouth at bedtime. 30 tablet 0   valACYclovir (VALTREX) 500 MG tablet Take 500 mg by mouth daily.      Labs  Lab Results:  Admission on 10/07/2022  Component Date Value Ref Range Status   Hgb A1c MFr Bld 10/15/2022  5.7 (H)  4.8 - 5.6 % Final   Comment: (NOTE)         Prediabetes: 5.7 - 6.4         Diabetes: >6.4         Glycemic control for adults with diabetes: <7.0    Mean Plasma Glucose 10/15/2022 117  mg/dL Final   Comment: (NOTE) Performed At: [name] [address] [place], [place] [id number] Jolene Schimke MD FI:[id number]   Admission on 10/07/2022, Discharged on 10/07/2022  Component Date Value Ref Range Status   Color, Urine 10/07/2022 YELLOW  YELLOW Final   APPearance 10/07/2022 HAZY (A)  CLEAR Final   Specific Gravity, Urine 10/07/2022 1.011  1.005 - 1.030 Final   pH 10/07/2022 7.0  5.0 - 8.0 Final   Glucose, UA 10/07/2022 NEGATIVE  NEGATIVE mg/dL Final   Hgb urine dipstick 10/07/2022 SMALL (A)  NEGATIVE Final   Bilirubin Urine 10/07/2022 NEGATIVE  NEGATIVE Final   Ketones, ur 10/07/2022 NEGATIVE  NEGATIVE mg/dL Final   Protein, ur 16/60/6301 NEGATIVE  NEGATIVE mg/dL Final   Nitrite 60/07/9322 NEGATIVE  NEGATIVE Final   Leukocytes,Ua 10/07/2022 LARGE (A)  NEGATIVE Final   RBC / HPF 10/07/2022 0-5  0 - 5 RBC/hpf Final   WBC, UA 10/07/2022 0-5  0 - 5 WBC/hpf Final   Bacteria, UA 10/07/2022 FEW (A)  NONE SEEN Final   Squamous Epithelial / HPF 10/07/2022 11-20  0 - 5 Final   Performed at [facility], [address], [place], [place] [address]   I-stat hCG, quantitative 10/07/2022 <5.0  <5 mIU/mL Final   Comment 3 10/07/2022          Final   Comment:   GEST. AGE      CONC.  (mIU/mL)   <=1 WEEK  5 - 50     2 WEEKS       50 - 500     3 WEEKS       100 - 10,000     4 WEEKS     1,000 - 30,000        FEMALE AND NON-PREGNANT FEMALE:     LESS THAN 5 mIU/mL    Sodium 10/07/2022 138  135 - 145 mmol/L Final   Potassium 10/07/2022 4.3  3.5 - 5.1 mmol/L Final   Chloride 10/07/2022 104  98 - 111 mmol/L Final   CO2 10/07/2022 28  22 - 32 mmol/L Final   Glucose, Bld 10/07/2022 90  70 - 99 mg/dL Final   Glucose reference range applies only to samples taken after  fasting for at least 8 hours.   BUN 10/07/2022 8  6 - 20 mg/dL Final   Creatinine, Ser 10/07/2022 0.96  0.44 - 1.00 mg/dL Final   Calcium 16/07/9603 9.1  8.9 - 10.3 mg/dL Final   GFR, Estimated 10/07/2022 >60  >60 mL/min Final   Comment: (NOTE) Calculated using the CKD-EPI Creatinine Equation (2021)    Anion gap 10/07/2022 6  5 - 15 Final   Performed at [facility], [address] [place], [place] [address]   WBC 10/07/2022 5.8  4.0 - 10.5 K/uL Final   RBC 10/07/2022 4.36  3.87 - 5.11 MIL/uL Final   Hemoglobin 10/07/2022 12.9  12.0 - 15.0 g/dL Final   HCT 11/91/4782 40.0  36.0 - 46.0 % Final   MCV 10/07/2022 91.7  80.0 - 100.0 fL Final   MCH 10/07/2022 29.6  26.0 - 34.0 pg Final   MCHC 10/07/2022 32.3  30.0 - 36.0 g/dL Final   RDW 95/62/1308 12.4  11.5 - 15.5 % Final   Platelets 10/07/2022 295  150 - 400 K/uL Final   nRBC 10/07/2022 0.0  0.0 - 0.2 % Final   Performed at [facility], [address], [place], [place] [address]   SARS Coronavirus 2 by RT PCR 10/07/2022 NEGATIVE  NEGATIVE Final   Comment: (NOTE) SARS-CoV-2 target nucleic acids are NOT DETECTED.  The SARS-CoV-2 RNA is generally detectable in upper respiratory specimens during the acute phase of infection. The lowest concentration of SARS-CoV-2 viral copies this assay can detect is 138 copies/mL. A negative result does not preclude SARS-Cov-2 infection and should not be used as the sole basis for treatment or other patient management decisions. A negative result may occur with  improper specimen collection/handling, submission of specimen other than nasopharyngeal swab, presence of viral mutation(s) within the areas targeted by this assay, and inadequate number of viral copies(<138 copies/mL). A negative result must be combined with clinical observations, patient history, and epidemiological information. The expected result is Negative.  Fact Sheet for Patients:   BloggerCourse.com  Fact Sheet for Healthcare Providers:  SeriousBroker.it  This test is no                          t yet approved or cleared by the [place] FDA and  has been authorized for detection and/or diagnosis of SARS-CoV-2 by FDA under an Emergency Use Authorization (EUA). This EUA will remain  in effect (meaning this test can be used) for the duration of the COVID-19 declaration under Section 564(b)(1) of the Act, 21 U.S.C.section 360bbb-3(b)(1), unless the authorization is terminated  or revoked sooner.       Influenza  A by PCR 10/07/2022 NEGATIVE  NEGATIVE Final   Influenza B by PCR 10/07/2022 NEGATIVE  NEGATIVE Final   Comment: (NOTE) The Xpert Xpress SARS-CoV-2/FLU/RSV plus assay is intended as an aid in the diagnosis of influenza from Nasopharyngeal swab specimens and should not be used as a sole basis for treatment. Nasal washings and aspirates are unacceptable for Xpert Xpress SARS-CoV-2/FLU/RSV testing.  Fact Sheet for Patients: BloggerCourse.com  Fact Sheet for Healthcare Providers: SeriousBroker.it  This test is not yet approved or cleared by the [place] FDA and has been authorized for detection and/or diagnosis of SARS-CoV-2 by FDA under an Emergency Use Authorization (EUA). This EUA will remain in effect (meaning this test can be used) for the duration of the COVID-19 declaration under Section 564(b)(1) of the Act, 21 U.S.C. section 360bbb-3(b)(1), unless the authorization is terminated or revoked.     Resp Syncytial Virus by PCR 10/07/2022 NEGATIVE  NEGATIVE Final   Comment: (NOTE) Fact Sheet for Patients: BloggerCourse.com  Fact Sheet for Healthcare Providers: SeriousBroker.it  This test is not yet approved or cleared by the [place] FDA and has been authorized for detection and/or  diagnosis of SARS-CoV-2 by FDA under an Emergency Use Authorization (EUA). This EUA will remain in effect (meaning this test can be used) for the duration of the COVID-19 declaration under Section 564(b)(1) of the Act, 21 U.S.C. section 360bbb-3(b)(1), unless the authorization is terminated or revoked.  Performed at [facility], [address], [place], [place] [address]    Specimen Description 10/07/2022 URINE, CLEAN CATCH   Final   Special Requests 10/07/2022    Final                   Value:NONE Performed at [facility], [address], [place], [place] [address]    Culture 10/07/2022 MULTIPLE SPECIES PRESENT, SUGGEST RECOLLECTION (A)   Final   Report Status 10/07/2022 10/08/2022 FINAL   Final  Admission on 07/25/2022, Discharged on 10/07/2022  Component Date Value Ref Range Status   SARS Coronavirus 2 by RT PCR 07/25/2022 NEGATIVE  NEGATIVE Final   Comment: (NOTE) SARS-CoV-2 target nucleic acids are NOT DETECTED.  The SARS-CoV-2 RNA is generally detectable in upper respiratory specimens during the acute phase of infection. The lowest concentration of SARS-CoV-2 viral copies this assay can detect is 138 copies/mL. A negative result does not preclude SARS-Cov-2 infection and should not be used as the sole basis for treatment or other patient management decisions. A negative result may occur with  improper specimen collection/handling, submission of specimen other than nasopharyngeal swab, presence of viral mutation(s) within the areas targeted by this assay, and inadequate number of viral copies(<138 copies/mL). A negative result must be combined with clinical observations, patient history, and epidemiological information. The expected result is Negative.  Fact Sheet for Patients:  BloggerCourse.com  Fact Sheet for Healthcare Providers:  SeriousBroker.it  This test is no                          t yet approved  or cleared by the [place] FDA and  has been authorized for detection and/or diagnosis of SARS-CoV-2 by FDA under an Emergency Use Authorization (EUA). This EUA will remain  in effect (meaning this test can be used) for the duration of the COVID-19 declaration under Section 564(b)(1) of the Act, 21 U.S.C.section 360bbb-3(b)(1), unless the authorization is terminated  or revoked sooner.  Influenza A by PCR 07/25/2022 NEGATIVE  NEGATIVE Final   Influenza B by PCR 07/25/2022 NEGATIVE  NEGATIVE Final   Comment: (NOTE) The Xpert Xpress SARS-CoV-2/FLU/RSV plus assay is intended as an aid in the diagnosis of influenza from Nasopharyngeal swab specimens and should not be used as a sole basis for treatment. Nasal washings and aspirates are unacceptable for Xpert Xpress SARS-CoV-2/FLU/RSV testing.  Fact Sheet for Patients: BloggerCourse.com  Fact Sheet for Healthcare Providers: SeriousBroker.it  This test is not yet approved or cleared by the [place] FDA and has been authorized for detection and/or diagnosis of SARS-CoV-2 by FDA under an Emergency Use Authorization (EUA). This EUA will remain in effect (meaning this test can be used) for the duration of the COVID-19 declaration under Section 564(b)(1) of the Act, 21 U.S.C. section 360bbb-3(b)(1), unless the authorization is terminated or revoked.  Performed at [facility], [address], [place], [place] [address]    WBC 07/25/2022 8.3  4.0 - 10.5 K/uL Final   RBC 07/25/2022 4.44  3.87 - 5.11 MIL/uL Final   Hemoglobin 07/25/2022 13.7  12.0 - 15.0 g/dL Final   HCT 09/32/3557 40.2  36.0 - 46.0 % Final   MCV 07/25/2022 90.5  80.0 - 100.0 fL Final   MCH 07/25/2022 30.9  26.0 - 34.0 pg Final   MCHC 07/25/2022 34.1  30.0 - 36.0 g/dL Final   RDW 32/20/2542 12.2  11.5 - 15.5 % Final   Platelets 07/25/2022 248  150 - 400 K/uL Final   nRBC 07/25/2022 0.0  0.0 - 0.2 %  Final   Neutrophils Relative % 07/25/2022 43  % Final   Neutro Abs 07/25/2022 3.6  1.7 - 7.7 K/uL Final   Lymphocytes Relative 07/25/2022 52  % Final   Lymphs Abs 07/25/2022 4.2 (H)  0.7 - 4.0 K/uL Final   Monocytes Relative 07/25/2022 5  % Final   Monocytes Absolute 07/25/2022 0.4  0.1 - 1.0 K/uL Final   Eosinophils Relative 07/25/2022 0  % Final   Eosinophils Absolute 07/25/2022 0.0  0.0 - 0.5 K/uL Final   Basophils Relative 07/25/2022 0  % Final   Basophils Absolute 07/25/2022 0.0  0.0 - 0.1 K/uL Final   Immature Granulocytes 07/25/2022 0  % Final   Abs Immature Granulocytes 07/25/2022 0.02  0.00 - 0.07 K/uL Final   Performed at [facility], [address], [place], [place] [address]   Sodium 07/25/2022 138  135 - 145 mmol/L Final   Potassium 07/25/2022 4.0  3.5 - 5.1 mmol/L Final   Chloride 07/25/2022 104  98 - 111 mmol/L Final   CO2 07/25/2022 29  22 - 32 mmol/L Final   Glucose, Bld 07/25/2022 83  70 - 99 mg/dL Final   Glucose reference range applies only to samples taken after fasting for at least 8 hours.   BUN 07/25/2022 11  6 - 20 mg/dL Final   Creatinine, Ser 07/25/2022 0.97  0.44 - 1.00 mg/dL Final   Calcium 76/28/3151 9.2  8.9 - 10.3 mg/dL Final   Total Protein 76/16/0737 7.0  6.5 - 8.1 g/dL Final   Albumin 10/62/6948 3.8  3.5 - 5.0 g/dL Final   AST 54/62/7035 18  15 - 41 U/L Final   ALT 07/25/2022 22  0 - 44 U/L Final   Alkaline Phosphatase 07/25/2022 64  38 - 126 U/L Final   Total Bilirubin 07/25/2022 0.2 (L)  0.3 - 1.2 mg/dL Final   GFR, Estimated 07/25/2022 >60  >  60 mL/min Final   Comment: (NOTE) Calculated using the CKD-EPI Creatinine Equation (2021)    Anion gap 07/25/2022 5  5 - 15 Final   Performed at [facility], [place] [address] [place], [place] [address]   POC Amphetamine UR 07/25/2022 None Detected  NONE DETECTED (Cut Off Level 1000 ng/mL) Preliminary   POC Secobarbital (BAR) 07/25/2022 None Detected  NONE DETECTED (Cut Off Level 300 ng/mL)  Preliminary   POC Buprenorphine (BUP) 07/25/2022 None Detected  NONE DETECTED (Cut Off Level 10 ng/mL) Preliminary   POC Oxazepam (BZO) 07/25/2022 None Detected  NONE DETECTED (Cut Off Level 300 ng/mL) Preliminary   POC Cocaine UR 07/25/2022 None Detected  NONE DETECTED (Cut Off Level 300 ng/mL) Preliminary   POC Methamphetamine UR 07/25/2022 None Detected  NONE DETECTED (Cut Off Level 1000 ng/mL) Preliminary   POC Morphine 07/25/2022 None Detected  NONE DETECTED (Cut Off Level 300 ng/mL) Preliminary   POC Methadone UR 07/25/2022 None Detected  NONE DETECTED (Cut Off Level 300 ng/mL) Preliminary   POC Oxycodone UR 07/25/2022 None Detected  NONE DETECTED (Cut Off Level 100 ng/mL) Preliminary   POC Marijuana UR 07/25/2022 None Detected  NONE DETECTED (Cut Off Level 50 ng/mL) Preliminary   SARSCOV2ONAVIRUS 2 AG 07/25/2022 NEGATIVE  NEGATIVE Final   Comment: (NOTE) SARS-CoV-2 antigen NOT DETECTED.   Negative results are presumptive.  Negative results do not preclude SARS-CoV-2 infection and should not be used as the sole basis for treatment or other patient management decisions, including infection  control decisions, particularly in the presence of clinical signs and  symptoms consistent with COVID-19, or in those who have been in contact with the virus.  Negative results must be combined with clinical observations, patient history, and epidemiological information. The expected result is Negative.  Fact Sheet for Patients: HandmadeRecipes.com.cy  Fact Sheet for Healthcare Providers: FuneralLife.at  This test is not yet approved or cleared by the [place] FDA and  has been authorized for detection and/or diagnosis of SARS-CoV-2 by FDA under an Emergency Use Authorization (EUA).  This EUA will remain in effect (meaning this test can be used) for the duration of  the COV                          ID-19 declaration under Section 564(b)(1) of  the Act, 21 U.S.C. section 360bbb-3(b)(1), unless the authorization is terminated or revoked sooner.     Cholesterol 07/25/2022 181  0 - 200 mg/dL Final   Triglycerides 07/25/2022 118  <150 mg/dL Final   HDL 07/25/2022 45  >40 mg/dL Final   Total CHOL/HDL Ratio 07/25/2022 4.0  RATIO Final   VLDL 07/25/2022 24  0 - 40 mg/dL Final   LDL Cholesterol 07/25/2022 112 (H)  0 - 99 mg/dL Final   Comment:        Total Cholesterol/HDL:CHD Risk Coronary Heart Disease Risk Table                     Men   Women  1/2 Average Risk   3.4   3.3  Average Risk       5.0   4.4  2 X Average Risk   9.6   7.1  3 X Average Risk  23.4   11.0        Use the calculated Patient Ratio above and the CHD Risk Table to determine the patient's CHD Risk.  ATP III CLASSIFICATION (LDL):  <100     mg/dL   Optimal  161-096  mg/dL   Near or Above                    Optimal  130-159  mg/dL   Borderline  045-409  mg/dL   High  >811     mg/dL   Very High Performed at [facility], [address], [place], [place] [address]    TSH 07/25/2022 6.668 (H)  0.350 - 4.500 uIU/mL Final   Comment: Performed by a 3rd Generation assay with a functional sensitivity of <=0.01 uIU/mL. Performed at [facility], [address] [place], [place] [address]    Glucose-Capillary 07/26/2022 104 (H)  70 - 99 mg/dL Final   Glucose reference range applies only to samples taken after fasting for at least 8 hours.   T3, Free 07/31/2022 2.3  2.0 - 4.4 pg/mL Final   Comment: (NOTE) Performed At: [facility] [address] [place], [place] [id number] Jolene Schimke MD ON:[id number]    Free T4 07/31/2022 0.60 (L)  0.61 - 1.12 ng/dL Final   Comment: (NOTE) Biotin ingestion may interfere with free T4 tests. If the results are inconsistent with the TSH level, previous test results, or the clinical presentation, then consider biotin interference. If needed, order repeat testing after stopping biotin. Performed at  [facility], [address], [place], [place] [address]    Glucose-Capillary 08/29/2022 100 (H)  70 - 99 mg/dL Final   Glucose reference range applies only to samples taken after fasting for at least 8 hours.   TSH 09/05/2022 0.793  0.350 - 4.500 uIU/mL Final   Comment: Performed by a 3rd Generation assay with a functional sensitivity of <=0.01 uIU/mL. Performed at [facility], [address], [place], [place] [address]    Free T4 09/05/2022 0.73  0.61 - 1.12 ng/dL Final   Comment: (NOTE) Biotin ingestion may interfere with free T4 tests. If the results are inconsistent with the TSH level, previous test results, or the clinical presentation, then consider biotin interference. If needed, order repeat testing after stopping biotin. Performed at [facility], [address], [place], [place] [address]    Preg Test, Ur 09/06/2022 Negative  Negative Final   Preg Test, Ur 09/06/2022 NEGATIVE  NEGATIVE Final   Comment:        THE SENSITIVITY OF THIS METHODOLOGY IS >24 mIU/mL    Preg Test, Ur 09/05/2022 NEGATIVE  NEGATIVE Final   Comment:        THE SENSITIVITY OF THIS METHODOLOGY IS >24 mIU/mL    Sodium 09/13/2022 136  135 - 145 mmol/L Final   Potassium 09/13/2022 4.5  3.5 - 5.1 mmol/L Final   Chloride 09/13/2022 105  98 - 111 mmol/L Final   CO2 09/13/2022 21 (L)  22 - 32 mmol/L Final   Glucose, Bld 09/13/2022 111 (H)  70 - 99 mg/dL Final   Glucose reference range applies only to samples taken after fasting for at least 8 hours.   BUN 09/13/2022 14  6 - 20 mg/dL Final   Creatinine, Ser 09/13/2022 0.92  0.44 - 1.00 mg/dL Final   Calcium 34/74/2595 9.1  8.9 - 10.3 mg/dL Final   GFR, Estimated 09/13/2022 >60  >60 mL/min Final   Comment: (NOTE) Calculated using the CKD-EPI Creatinine Equation (2021)    Anion gap 09/13/2022 10  5 - 15 Final  Performed at [facility], [address] [place], [place] [address]   Vitamin B-12 09/13/2022 585  180 - 914 pg/mL  Final   Comment: (NOTE) This assay is not validated for testing neonatal or myeloproliferative syndrome specimens for Vitamin B12 levels. Performed at [facility], [address] [place], [place] [address]    Color, Urine 09/14/2022 YELLOW  YELLOW Final   APPearance 09/14/2022 HAZY (A)  CLEAR Final   Specific Gravity, Urine 09/14/2022 1.025  1.005 - 1.030 Final   pH 09/14/2022 5.0  5.0 - 8.0 Final   Glucose, UA 09/14/2022 NEGATIVE  NEGATIVE mg/dL Final   Hgb urine dipstick 09/14/2022 NEGATIVE  NEGATIVE Final   Bilirubin Urine 09/14/2022 NEGATIVE  NEGATIVE Final   Ketones, ur 09/14/2022 NEGATIVE  NEGATIVE mg/dL Final   Protein, ur 62/95/284112/11/2021 NEGATIVE  NEGATIVE mg/dL Final   Nitrite 32/44/010212/11/2021 NEGATIVE  NEGATIVE Final   Leukocytes,Ua 09/14/2022 TRACE (A)  NEGATIVE Final   RBC / HPF 09/14/2022 0-5  0 - 5 RBC/hpf Final   WBC, UA 09/14/2022 0-5  0 - 5 WBC/hpf Final   Bacteria, UA 09/14/2022 RARE (A)  NONE SEEN Final   Squamous Epithelial / HPF 09/14/2022 0-5  0 - 5 Final   Mucus 09/14/2022 PRESENT   Final   Performed at [facility], [address] [place], [place] [address]   Glucose-Capillary 09/16/2022 105 (H)  70 - 99 mg/dL Final   Glucose reference range applies only to samples taken after fasting for at least 8 hours.   SARS Coronavirus 2 by RT PCR 09/30/2022 NEGATIVE  NEGATIVE Final   Comment: (NOTE) SARS-CoV-2 target nucleic acids are NOT DETECTED.  The SARS-CoV-2 RNA is generally detectable in upper respiratory specimens during the acute phase of infection. The lowest concentration of SARS-CoV-2 viral copies this assay can detect is 138 copies/mL. A negative result does not preclude SARS-Cov-2 infection and should not be used as the sole basis for treatment or other patient management decisions. A negative result may occur with  improper specimen collection/handling, submission of specimen other than nasopharyngeal swab, presence of viral mutation(s) within  the areas targeted by this assay, and inadequate number of viral copies(<138 copies/mL). A negative result must be combined with clinical observations, patient history, and epidemiological information. The expected result is Negative.  Fact Sheet for Patients:  BloggerCourse.comhttps://www.fda.gov/media/152166/download  Fact Sheet for Healthcare Providers:  SeriousBroker.ithttps://www.fda.gov/media/152162/download  This test is no                          t yet approved or cleared by the [place] FDA and  has been authorized for detection and/or diagnosis of SARS-CoV-2 by FDA under an Emergency Use Authorization (EUA). This EUA will remain  in effect (meaning this test can be used) for the duration of the COVID-19 declaration under Section 564(b)(1) of the Act, 21 U.S.C.section 360bbb-3(b)(1), unless the authorization is terminated  or revoked sooner.       Influenza A by PCR 09/30/2022 NEGATIVE  NEGATIVE Final   Influenza B by PCR 09/30/2022 NEGATIVE  NEGATIVE Final   Comment: (NOTE) The Xpert Xpress SARS-CoV-2/FLU/RSV plus assay is intended as an aid in the diagnosis of influenza from Nasopharyngeal swab specimens and should not be used as a sole basis for treatment. Nasal washings and aspirates are unacceptable for Xpert Xpress SARS-CoV-2/FLU/RSV testing.  Fact Sheet for Patients: BloggerCourse.comhttps://www.fda.gov/media/152166/download  Fact Sheet for Healthcare Providers: SeriousBroker.ithttps://www.fda.gov/media/152162/download  This  test is not yet approved or cleared by the [place] and has been authorized for detection and/or diagnosis of SARS-CoV-2 by FDA under an Emergency Use Authorization (EUA). This EUA will remain in effect (meaning this test can be used) for the duration of the COVID-19 declaration under Section 564(b)(1) of the Act, 21 U.S.C. section 360bbb-3(b)(1), unless the authorization is terminated or revoked.     Resp Syncytial Virus by PCR 09/30/2022 NEGATIVE  NEGATIVE Final   Comment:  (NOTE) Fact Sheet for Patients: BloggerCourse.com  Fact Sheet for Healthcare Providers: SeriousBroker.it  This test is not yet approved or cleared by the [place] FDA and has been authorized for detection and/or diagnosis of SARS-CoV-2 by FDA under an Emergency Use Authorization (EUA). This EUA will remain in effect (meaning this test can be used) for the duration of the COVID-19 declaration under Section 564(b)(1) of the Act, 21 U.S.C. section 360bbb-3(b)(1), unless the authorization is terminated or revoked.  Performed at [facility], [address] [place], [place] [address]    Sodium 10/01/2022 136  135 - 145 mmol/L Final   Potassium 10/01/2022 3.8  3.5 - 5.1 mmol/L Final   Chloride 10/01/2022 104  98 - 111 mmol/L Final   CO2 10/01/2022 27  22 - 32 mmol/L Final   Glucose, Bld 10/01/2022 98  70 - 99 mg/dL Final   Glucose reference range applies only to samples taken after fasting for at least 8 hours.   BUN 10/01/2022 12  6 - 20 mg/dL Final   Creatinine, Ser 10/01/2022 0.98  0.44 - 1.00 mg/dL Final   Calcium 16/04/3709 8.9  8.9 - 10.3 mg/dL Final   GFR, Estimated 10/01/2022 >60  >60 mL/min Final   Comment: (NOTE) Calculated using the CKD-EPI Creatinine Equation (2021)    Anion gap 10/01/2022 5  5 - 15 Final   Performed at [facility], [address] [place], [place] [address]   WBC 10/01/2022 5.1  4.0 - 10.5 K/uL Final   RBC 10/01/2022 4.31  3.87 - 5.11 MIL/uL Final   Hemoglobin 10/01/2022 12.7  12.0 - 15.0 g/dL Final   HCT 85/46/2703 38.8  36.0 - 46.0 % Final   MCV 10/01/2022 90.0  80.0 - 100.0 fL Final   MCH 10/01/2022 29.5  26.0 - 34.0 pg Final   MCHC 10/01/2022 32.7  30.0 - 36.0 g/dL Final   RDW 50/06/3817 12.4  11.5 - 15.5 % Final   Platelets 10/01/2022 300  150 - 400 K/uL Final   nRBC 10/01/2022 0.0  0.0 - 0.2 % Final   Performed at [facility], [address], [place], [place] [address]    Lipase 10/01/2022 29  11 - 51 U/L Final   Performed at [facility], [address] [place], [place] [address]   Color, Urine 10/05/2022 YELLOW  YELLOW Final   APPearance 10/05/2022 HAZY (A)  CLEAR Final   Specific Gravity, Urine 10/05/2022 1.018  1.005 - 1.030 Final   pH 10/05/2022 5.0  5.0 - 8.0 Final   Glucose, UA 10/05/2022 NEGATIVE  NEGATIVE mg/dL Final   Hgb urine dipstick 10/05/2022 NEGATIVE  NEGATIVE Final   Bilirubin Urine 10/05/2022 NEGATIVE  NEGATIVE Final   Ketones, ur 10/05/2022 NEGATIVE  NEGATIVE mg/dL Final   Protein, ur 89/38/1017 NEGATIVE  NEGATIVE mg/dL Final   Nitrite 51/11/5850 NEGATIVE  NEGATIVE Final   Leukocytes,Ua 10/05/2022 TRACE (A)  NEGATIVE Final   RBC / HPF 10/05/2022 0-5  0 - 5 RBC/hpf Final   WBC, UA 10/05/2022 0-5  0 - 5 WBC/hpf Final   Bacteria, UA 10/05/2022 RARE (A)  NONE SEEN Final   Squamous Epithelial / HPF 10/05/2022 0-5  0 - 5 Final   Mucus 10/05/2022 PRESENT   Final   Performed at [facility], [address], [place], [place] [address]   Specimen Description 10/05/2022 URINE, CLEAN CATCH   Final   Special Requests 10/05/2022    Final                   Value:NONE Performed at [facility], [address] [place], [place] [address]    Culture 10/05/2022 10,000 COLONIES/mL MULTIPLE SPECIES PRESENT, SUGGEST RECOLLECTION (A)   Final   Report Status 10/05/2022 10/07/2022 FINAL   Final  Admission on 07/04/2022, Discharged on 07/05/2022  Component Date Value Ref Range Status   Sodium 07/04/2022 142  135 - 145 mmol/L Final   Potassium 07/04/2022 4.4  3.5 - 5.1 mmol/L Final   Chloride 07/04/2022 109  98 - 111 mmol/L Final   CO2 07/04/2022 26  22 - 32 mmol/L Final   Glucose, Bld 07/04/2022 96  70 - 99 mg/dL Final   Glucose reference range applies only to samples taken after fasting for at least 8 hours.   BUN 07/04/2022 15  6 - 20 mg/dL Final   Creatinine, Ser 07/04/2022 0.83  0.44 - 1.00 mg/dL Final   Calcium 91/47/8295 9.3   8.9 - 10.3 mg/dL Final   Total Protein 62/13/0865 7.2  6.5 - 8.1 g/dL Final   Albumin 78/46/9629 3.9  3.5 - 5.0 g/dL Final   AST 52/84/1324 23  15 - 41 U/L Final   ALT 07/04/2022 28  0 - 44 U/L Final   Alkaline Phosphatase 07/04/2022 77  38 - 126 U/L Final   Total Bilirubin 07/04/2022 0.4  0.3 - 1.2 mg/dL Final   GFR, Estimated 07/04/2022 >60  >60 mL/min Final   Comment: (NOTE) Calculated using the CKD-EPI Creatinine Equation (2021)    Anion gap 07/04/2022 7  5 - 15 Final   Performed at [facility], [address], [place], [place] [address]   Alcohol, Ethyl (B) 07/04/2022 <10  <10 mg/dL Final   Comment: (NOTE) Lowest detectable limit for serum alcohol is 10 mg/dL.  For medical purposes only. Performed at [name], [address], [place], [place] [address]    WBC 07/04/2022 7.0  4.0 - 10.5 K/uL Final   RBC 07/04/2022 4.18  3.87 - 5.11 MIL/uL Final   Hemoglobin 07/04/2022 12.8  12.0 - 15.0 g/dL Final   HCT 64/40/3474 39.1  36.0 - 46.0 % Final   MCV 07/04/2022 93.5  80.0 - 100.0 fL Final   MCH 07/04/2022 30.6  26.0 - 34.0 pg Final   MCHC 07/04/2022 32.7  30.0 - 36.0 g/dL Final   RDW 25/95/6387 12.9  11.5 - 15.5 % Final   Platelets 07/04/2022 243  150 - 400 K/uL Final   nRBC 07/04/2022 0.0  0.0 - 0.2 % Final   Neutrophils Relative % 07/04/2022 43  % Final   Neutro Abs 07/04/2022 3.0  1.7 - 7.7 K/uL Final   Lymphocytes Relative 07/04/2022 50  % Final   Lymphs Abs 07/04/2022 3.5  0.7 - 4.0 K/uL Final   Monocytes Relative 07/04/2022 7  % Final   Monocytes Absolute 07/04/2022 0.5  0.1 - 1.0 K/uL Final  Eosinophils Relative 07/04/2022 0  % Final   Eosinophils Absolute 07/04/2022 0.0  0.0 - 0.5 K/uL Final   Basophils Relative 07/04/2022 0  % Final   Basophils Absolute 07/04/2022 0.0  0.0 - 0.1 K/uL Final   Immature Granulocytes 07/04/2022 0  % Final   Abs Immature Granulocytes 07/04/2022 0.01  0.00 - 0.07 K/uL Final   Performed at [facility], [place] [address], [place], [place] [address]   I-stat hCG, quantitative 07/04/2022 <5.0  <5 mIU/mL Final   Comment 3 07/04/2022          Final   Comment:   GEST. AGE      CONC.  (mIU/mL)   <=1 WEEK        5 - 50     2 WEEKS       50 - 500     3 WEEKS       100 - 10,000     4 WEEKS     1,000 - 30,000        FEMALE AND NON-PREGNANT FEMALE:     LESS THAN 5 mIU/mL   Admission on 06/14/2022, Discharged on 06/14/2022  Component Date Value Ref Range Status   Color, UA 06/14/2022 yellow  yellow Final   Clarity, UA 06/14/2022 cloudy (A)  clear Final   Glucose, UA 06/14/2022 negative  negative mg/dL Final   Bilirubin, UA 06/14/2022 negative  negative Final   Ketones, POC UA 06/14/2022 negative  negative mg/dL Final   Spec Grav, UA 06/14/2022 1.025  1.010 - 1.025 Final   Blood, UA 06/14/2022 negative  negative Final   pH, UA 06/14/2022 7.5  5.0 - 8.0 Final   Protein Ur, POC 06/14/2022 =30 (A)  negative mg/dL Final   Urobilinogen, UA 06/14/2022 0.2  0.2 or 1.0 E.U./dL Final   Nitrite, UA 06/14/2022 Negative  Negative Final   Leukocytes, UA 06/14/2022 Negative  Negative Final   Preg Test, Ur 06/14/2022 Negative  Negative Final   Specimen Description 06/14/2022 URINE, CLEAN CATCH   Final   Special Requests 06/14/2022    Final                   Value:NONE Performed at [facility], [place] [address], [place], [place] [address]    Culture 06/14/2022 MULTIPLE SPECIES PRESENT, SUGGEST RECOLLECTION (A)   Final   Report Status 06/14/2022 06/16/2022 FINAL   Final  Admission on 06/07/2022, Discharged on 06/10/2022  Component Date Value Ref Range Status   SARS Coronavirus 2 by RT PCR 06/07/2022 NEGATIVE  NEGATIVE Final   Comment: (NOTE) SARS-CoV-2 target nucleic acids are NOT DETECTED.  The SARS-CoV-2 RNA is generally detectable in upper respiratory specimens during the acute phase of infection. The lowest concentration of SARS-CoV-2 viral copies this assay can detect  is 138 copies/mL. A negative result does not preclude SARS-Cov-2 infection and should not be used as the sole basis for treatment or other patient management decisions. A negative result may occur with  improper specimen collection/handling, submission of specimen other than nasopharyngeal swab, presence of viral mutation(s) within the areas targeted by this assay, and inadequate number of viral copies(<138 copies/mL). A negative result must be combined with clinical observations, patient history, and epidemiological information. The expected result is Negative.  Fact Sheet for Patients:  EntrepreneurPulse.com.au  Fact Sheet for Healthcare Providers:  IncredibleEmployment.be  This test is no  t yet approved or cleared by the [place] and  has been authorized for detection and/or diagnosis of SARS-CoV-2 by FDA under an Emergency Use Authorization (EUA). This EUA will remain  in effect (meaning this test can be used) for the duration of the COVID-19 declaration under Section 564(b)(1) of the Act, 21 U.S.C.section 360bbb-3(b)(1), unless the authorization is terminated  or revoked sooner.       Influenza A by PCR 06/07/2022 NEGATIVE  NEGATIVE Final   Influenza B by PCR 06/07/2022 NEGATIVE  NEGATIVE Final   Comment: (NOTE) The Xpert Xpress SARS-CoV-2/FLU/RSV plus assay is intended as an aid in the diagnosis of influenza from Nasopharyngeal swab specimens and should not be used as a sole basis for treatment. Nasal washings and aspirates are unacceptable for Xpert Xpress SARS-CoV-2/FLU/RSV testing.  Fact Sheet for Patients: BloggerCourse.com  Fact Sheet for Healthcare Providers: SeriousBroker.it  This test is not yet approved or cleared by the [place] FDA and has been authorized for detection and/or diagnosis of SARS-CoV-2 by FDA under an Emergency Use  Authorization (EUA). This EUA will remain in effect (meaning this test can be used) for the duration of the COVID-19 declaration under Section 564(b)(1) of the Act, 21 U.S.C. section 360bbb-3(b)(1), unless the authorization is terminated or revoked.  Performed at [facility], [address] [place], [place] [address]    WBC 06/07/2022 5.7  4.0 - 10.5 K/uL Final   RBC 06/07/2022 4.39  3.87 - 5.11 MIL/uL Final   Hemoglobin 06/07/2022 13.2  12.0 - 15.0 g/dL Final   HCT 54/00/8676 40.4  36.0 - 46.0 % Final   MCV 06/07/2022 92.0  80.0 - 100.0 fL Final   MCH 06/07/2022 30.1  26.0 - 34.0 pg Final   MCHC 06/07/2022 32.7  30.0 - 36.0 g/dL Final   RDW 19/50/9326 12.4  11.5 - 15.5 % Final   Platelets 06/07/2022 308  150 - 400 K/uL Final   nRBC 06/07/2022 0.0  0.0 - 0.2 % Final   Neutrophils Relative % 06/07/2022 42  % Final   Neutro Abs 06/07/2022 2.4  1.7 - 7.7 K/uL Final   Lymphocytes Relative 06/07/2022 54  % Final   Lymphs Abs 06/07/2022 3.1  0.7 - 4.0 K/uL Final   Monocytes Relative 06/07/2022 4  % Final   Monocytes Absolute 06/07/2022 0.2  0.1 - 1.0 K/uL Final   Eosinophils Relative 06/07/2022 0  % Final   Eosinophils Absolute 06/07/2022 0.0  0.0 - 0.5 K/uL Final   Basophils Relative 06/07/2022 0  % Final   Basophils Absolute 06/07/2022 0.0  0.0 - 0.1 K/uL Final   Immature Granulocytes 06/07/2022 0  % Final   Abs Immature Granulocytes 06/07/2022 0.01  0.00 - 0.07 K/uL Final   Performed at [facility], [address], [place], [place] [address]   Sodium 06/07/2022 139  135 - 145 mmol/L Final   Potassium 06/07/2022 4.0  3.5 - 5.1 mmol/L Final   Chloride 06/07/2022 104  98 - 111 mmol/L Final   CO2 06/07/2022 28  22 - 32 mmol/L Final   Glucose, Bld 06/07/2022 104 (H)  70 - 99 mg/dL Final   Glucose reference range applies only to samples taken after fasting for at least 8 hours.   BUN 06/07/2022 8  6 - 20 mg/dL Final   Creatinine, Ser 06/07/2022 0.84  0.44 - 1.00 mg/dL  Final   Calcium 80/99/8338 9.1  8.9 - 10.3 mg/dL Final  Total Protein 06/07/2022 6.9  6.5 - 8.1 g/dL Final   Albumin 06/07/2022 3.7  3.5 - 5.0 g/dL Final   AST 06/07/2022 19  15 - 41 U/L Final   ALT 06/07/2022 22  0 - 44 U/L Final   Alkaline Phosphatase 06/07/2022 54  38 - 126 U/L Final   Total Bilirubin 06/07/2022 0.4  0.3 - 1.2 mg/dL Final   GFR, Estimated 06/07/2022 >60  >60 mL/min Final   Comment: (NOTE) Calculated using the CKD-EPI Creatinine Equation (2021)    Anion gap 06/07/2022 7  5 - 15 Final   Performed at [facility], [place] [address] [place], [place] [address]   Hgb A1c MFr Bld 06/07/2022 5.0  4.8 - 5.6 % Final   Comment: (NOTE) Pre diabetes:          5.7%-6.4%  Diabetes:              >6.4%  Glycemic control for   <7.0% adults with diabetes    Mean Plasma Glucose 06/07/2022 96.8  mg/dL Final   Performed at [facility], [place] [address], [place], [place] [address]   TSH 06/07/2022 1.620  0.350 - 4.500 uIU/mL Final   Comment: Performed by a 3rd Generation assay with a functional sensitivity of <=0.01 uIU/mL. Performed at [facility], [place] [address] [place], [place] [address]    RPR Ser Ql 06/07/2022 NON REACTIVE  NON REACTIVE Final   Performed at [facility], [place] [address], [place], [place] [address]   Color, Urine 06/07/2022 YELLOW  YELLOW Final   APPearance 06/07/2022 HAZY (A)  CLEAR Final   Specific Gravity, Urine 06/07/2022 1.018  1.005 - 1.030 Final   pH 06/07/2022 7.0  5.0 - 8.0 Final   Glucose, UA 06/07/2022 NEGATIVE  NEGATIVE mg/dL Final   Hgb urine dipstick 06/07/2022 NEGATIVE  NEGATIVE Final   Bilirubin Urine 06/07/2022 NEGATIVE  NEGATIVE Final   Ketones, ur 06/07/2022 NEGATIVE  NEGATIVE mg/dL Final   Protein, ur 06/07/2022 NEGATIVE  NEGATIVE mg/dL Final   Nitrite 06/07/2022 NEGATIVE  NEGATIVE Final   Leukocytes,Ua 06/07/2022 NEGATIVE  NEGATIVE Final   Performed at [facility], [place] [address] [place], [place] [address]    Cholesterol 06/07/2022 164  0 - 200 mg/dL Final   Triglycerides 06/07/2022 158 (H)  <150 mg/dL Final   HDL 06/07/2022 44  >40 mg/dL Final   Total CHOL/HDL Ratio 06/07/2022 3.7  RATIO Final   VLDL 06/07/2022 32  0 - 40 mg/dL Final   LDL Cholesterol 06/07/2022 88  0 - 99 mg/dL Final   Comment:        Total Cholesterol/HDL:CHD Risk Coronary Heart Disease Risk Table                     Men   Women  1/2 Average Risk   3.4   3.3  Average Risk       5.0   4.4  2 X Average Risk   9.6   7.1  3 X Average Risk  23.4   11.0        Use the calculated Patient Ratio above and the CHD Risk Table to determine the patient's CHD Risk.        ATP III CLASSIFICATION (LDL):  <100     mg/dL   Optimal  100-129  mg/dL   Near or Above  Optimal  130-159  mg/dL   Borderline  735-329  mg/dL   High  >924     mg/dL   Very High Performed at [facility], [address], [place], [place] [address]    HIV Screen 4th Generation wRfx 06/07/2022 Non Reactive  Non Reactive Final   Performed at [facility], [address], [name], [place] [address]   SARSCOV2ONAVIRUS 2 AG 06/07/2022 NEGATIVE  NEGATIVE Final   Comment: (NOTE) SARS-CoV-2 antigen NOT DETECTED.   Negative results are presumptive.  Negative results do not preclude SARS-CoV-2 infection and should not be used as the sole basis for treatment or other patient management decisions, including infection  control decisions, particularly in the presence of clinical signs and  symptoms consistent with COVID-19, or in those who have been in contact with the virus.  Negative results must be combined with clinical observations, patient history, and epidemiological information. The expected result is Negative.  Fact Sheet for Patients: https://www.jennings-kim.com/  Fact Sheet for Healthcare Providers: https://alexander-rogers.biz/  This test is not yet approved or cleared by the [place] FDA and   has been authorized for detection and/or diagnosis of SARS-CoV-2 by FDA under an Emergency Use Authorization (EUA).  This EUA will remain in effect (meaning this test can be used) for the duration of  the COV                          ID-19 declaration under Section 564(b)(1) of the Act, 21 U.S.C. section 360bbb-3(b)(1), unless the authorization is terminated or revoked sooner.     POC Amphetamine UR 06/07/2022 None Detected  NONE DETECTED (Cut Off Level 1000 ng/mL) Final   POC Secobarbital (BAR) 06/07/2022 None Detected  NONE DETECTED (Cut Off Level 300 ng/mL) Final   POC Buprenorphine (BUP) 06/07/2022 None Detected  NONE DETECTED (Cut Off Level 10 ng/mL) Final   POC Oxazepam (BZO) 06/07/2022 None Detected  NONE DETECTED (Cut Off Level 300 ng/mL) Final   POC Cocaine UR 06/07/2022 None Detected  NONE DETECTED (Cut Off Level 300 ng/mL) Final   POC Methamphetamine UR 06/07/2022 None Detected  NONE DETECTED (Cut Off Level 1000 ng/mL) Final   POC Morphine 06/07/2022 None Detected  NONE DETECTED (Cut Off Level 300 ng/mL) Final   POC Methadone UR 06/07/2022 None Detected  NONE DETECTED (Cut Off Level 300 ng/mL) Final   POC Oxycodone UR 06/07/2022 None Detected  NONE DETECTED (Cut Off Level 100 ng/mL) Final   POC Marijuana UR 06/07/2022 None Detected  NONE DETECTED (Cut Off Level 50 ng/mL) Final   Preg Test, Ur 06/08/2022 NEGATIVE  NEGATIVE Final   Comment:        THE SENSITIVITY OF THIS METHODOLOGY IS >20 mIU/mL. Performed at [facility], [address], [place], [place] [address]    Preg Test, Ur 06/08/2022 NEGATIVE  NEGATIVE Final   Comment:        THE SENSITIVITY OF THIS METHODOLOGY IS >24 mIU/mL     Blood Alcohol level:  Lab Results  Component Value Date   ETH <10 07/04/2022   ETH <10 02/07/2022    Metabolic Disorder Labs: Lab Results  Component Value Date   HGBA1C 5.7 (H) 10/15/2022   MPG 117 10/15/2022   MPG 96.8 06/07/2022   No results found for:  "PROLACTIN" Lab Results  Component Value Date   CHOL 181 07/25/2022   TRIG 118 07/25/2022   HDL 45 07/25/2022  CHOLHDL 4.0 07/25/2022   VLDL 24 07/25/2022   LDLCALC 112 (H) 07/25/2022   LDLCALC 88 06/07/2022    Therapeutic Lab Levels: No results found for: "LITHIUM" No results found for: "VALPROATE" No results found for: "CBMZ"  Physical Findings   GAD-7    Flowsheet Row [facility]  Total GAD-7 Score 17      PHQ2-9    Flowsheet Row [facility]  PHQ-2 Total Score 0 1 2  PHQ-9 Total Score -- 2 8      Flowsheet Row [facility] Most recent reading at 10/27/2022  9:31 AM ED [facility] Most recent reading at 10/07/2022 12:18 PM [facility] Most recent reading at 10/06/2022  9:38 AM  C-SSRS RISK CATEGORY No Risk No Risk No Risk        Musculoskeletal  Strength & Muscle Tone: within normal limits Gait & Station: normal Patient leans: N/A  Psychiatric Specialty Exam  Presentation  General Appearance:  Appropriate for Environment  [name] Contact: Fair  Speech: Clear and Coherent  Speech Volume: Normal  Handedness: Right   Mood and Affect  Mood: Euthymic  Affect: Appropriate   Thought Process  Thought Processes: Coherent  Descriptions of Associations:Intact  Orientation:Full (Time, Place and Person)  Thought Content:WDL  Diagnosis of Schizophrenia or Schizoaffective disorder in past: Yes  Duration of Psychotic Symptoms: Greater than six months   Hallucinations:Denied [facility] Ideas of Reference:None  Suicidal Thoughts:Denied  Homicidal  Thoughts:Homicidal Thoughts: No   Sensorium  Memory: Immediate Fair; Recent Fair  Judgment: Fair  Insight: Fair   Chartered certified accountantxecutive Functions  Concentration: Fair  Attention Span: Fair  Recall: Fair  Fund of Knowledge: Fair  Language: Good   Psychomotor Activity  Psychomotor Activity: Psychomotor Activity: Normal   Assets  Assets: Communication Skills; Resilience; Desire for Improvement   Sleep  Sleep: Sleep: Good   Nutritional Assessment (For [facility] and [facility] admissions only) Has the patient had a weight loss or gain of 10 pounds or more in the last 3 months?: No Has the patient had a decrease in food intake/or appetite?: No Does the patient have dental problems?: No Does the patient have eating habits or behaviors that may be indicators of an eating disorder including binging or inducing vomiting?: No Has the patient recently lost weight without trying?: 0 Has the patient been eating poorly because of a decreased appetite?: 0 Malnutrition Screening Tool Score: 0    Physical Exam  Physical Exam Vitals and nursing note reviewed.  Constitutional:      General: She is not in acute distress.    Appearance: She is not ill-appearing or diaphoretic.  HENT:     Head: Normocephalic and atraumatic.  Pulmonary:     Effort: Pulmonary effort is normal. No respiratory distress.  Neurological:     General: No focal deficit present.     Mental Status: She is alert and oriented to person, place, and time.    Review of Systems  Constitutional:  Negative for malaise/fatigue.  Respiratory:  Negative for shortness of breath.   Cardiovascular:  Negative for chest pain.  Gastrointestinal:  Positive for constipation. Negative for abdominal pain, diarrhea, nausea and vomiting.  Musculoskeletal:  Positive for back pain.  Neurological:  Negative for dizziness and headaches.   Blood pressure (!) 112/100, pulse 91, temperature 97.9 F (36.6 C), temperature source Oral, resp.  rate 17, height 5\' 1"  (1.549 m), weight 198 lb (89.8 kg), SpO2 98 %. Body mass index is 37.41 kg/m.  Treatment Plan Summary: Daily contact with patient to assess and evaluate symptoms and progress in treatment and Medication management.  Patient is awaiting group home placement on Monday 22, 2024. She will move into [facility].   Princess Bruins, DO 10/30/2022 8:31 AM

## 2022-10-30 NOTE — ED Notes (Signed)
Pt sleeping at present, no distress noted.  Monitoring for safety. 

## 2022-10-30 NOTE — ED Notes (Signed)
Patient resting quietly in bed with eyes closed. Respirations equal and unlabored, skin warm and dry, NAD. Routine safety checks conducted according to facility protocol. Will continue to monitor for safety.  

## 2022-10-30 NOTE — ED Notes (Signed)
Pt asleep at this hour. No apparent distress. RR even and unlabored. Monitored for safety.

## 2022-10-30 NOTE — ED Notes (Signed)
Pt alert and orient x 4. Pt sad due to her being denied placement. She is at nursing window expressing her feelings. She has no c/o pain. Will continue to monitor for safety

## 2022-10-30 NOTE — ED Notes (Signed)
[name]&Ox4. Denies SI/HI and A/VH. Patient denies any physical complaints when asked. No acute distress noted. Support and encouragement provided. Routine safety checks conducted according to facility protocol. Encouraged patient to notify staff if thoughts of harm toward self or others arise. Patient verbalize understanding and agreement. Will continue to monitor for safety.    

## 2022-10-30 NOTE — ED Notes (Signed)
Pt was given lunch and juice.

## 2022-10-31 DIAGNOSIS — F17200 Nicotine dependence, unspecified, uncomplicated: Secondary | ICD-10-CM | POA: Diagnosis not present

## 2022-10-31 DIAGNOSIS — N9489 Other specified conditions associated with female genital organs and menstrual cycle: Secondary | ICD-10-CM | POA: Diagnosis not present

## 2022-10-31 DIAGNOSIS — F419 Anxiety disorder, unspecified: Secondary | ICD-10-CM | POA: Diagnosis not present

## 2022-10-31 DIAGNOSIS — F333 Major depressive disorder, recurrent, severe with psychotic symptoms: Secondary | ICD-10-CM | POA: Diagnosis not present

## 2022-10-31 DIAGNOSIS — E039 Hypothyroidism, unspecified: Secondary | ICD-10-CM | POA: Diagnosis not present

## 2022-10-31 DIAGNOSIS — E118 Type 2 diabetes mellitus with unspecified complications: Secondary | ICD-10-CM | POA: Diagnosis not present

## 2022-10-31 DIAGNOSIS — F209 Schizophrenia, unspecified: Secondary | ICD-10-CM | POA: Diagnosis not present

## 2022-10-31 DIAGNOSIS — N390 Urinary tract infection, site not specified: Secondary | ICD-10-CM | POA: Diagnosis not present

## 2022-10-31 DIAGNOSIS — Z1152 Encounter for screening for COVID-19: Secondary | ICD-10-CM | POA: Diagnosis not present

## 2022-10-31 NOTE — ED Provider Notes (Signed)
Behavioral Health Progress Note  Date and Time: 10/31/2022 10:17 AM Name: Nicole [name] MRN:  [id number]  Subjective: Patient states "I am okay now, I was worried, I told my dad that I was afraid I was going to be put out."  Nicole [name] is reassessed, face-to-face, by nurse practitioner.  She is standing in observation area upon my approach, no apparent distress.  She is alert and oriented, pleasant and cooperative during assessment.  She presents with euthymic mood, congruent affect.  Patient continues to deny suicidal and homicidal ideation.  She easily contracts verbally for safety with this Clinical research associate.  She denies auditory and visual hallucinations currently.  There is no evidence of delusional thought content and no indication that patient is finding to internal stimuli.  Patient verbalizes understanding of treatment plan to include treatment team meeting scheduled today at noon.  She is looking forward to finding placement at group home.  Patient interacts appropriately with staff and peers.  She is compliant with medications.  She endorses average sleep and appetite.   Patient offered support and encouragement.  Patient remains cleared by psychiatry.    Nicole [name] is a 32 y.o. female, with PMH of SCZ unspecified, MDD with psychosis, who presented voluntary to [facility] (07/25/2022) via [facility] after a verbal altercation with Nicole [name] (not patient's legal guardian) for transient SI.  This is patient's fourth visit to [facility] for similar concerns this year. Patient has been dismissed from previous group home due to threatening SI and HI, then leaving the group home.   Legal Guardian: Mom Nicole [name]) transitioning to be Dad Nicole [name]) Point of contact: Dad Nicole [name])     Diagnosis:  Final diagnoses:  Tobacco use disorder  Schizophrenia, unspecified type (HCC)  MDD (major depressive disorder), recurrent episode, moderate (HCC)  Hypothyroidism, unspecified type    Total Time spent  with patient: 20 minutes  Past Psychiatric History: see H & P Past Medical History:  Past Medical History:  Diagnosis Date   Anxiety    Depression    Hypothyroidism 08/07/2022   Tobacco use disorder 08/07/2022    Past Surgical History:  Procedure Laterality Date   WISDOM TOOTH EXTRACTION Bilateral 2020   Family History:  Family History  Problem Relation Age of Onset   Hypertension Father    Diabetes Father    Family Psychiatric  History: none reported Social History:  Social History   Substance and Sexual Activity  Alcohol Use Never     Social History   Substance and Sexual Activity  Drug Use Never    Social History   Socioeconomic History   Marital status: Single    Spouse name: Not on file   Number of children: Not on file   Years of education: Not on file   Highest education level: Not on file  Occupational History   Not on file  Tobacco Use   Smoking status: Every Day    Types: Cigarettes   Smokeless tobacco: Never  Vaping Use   Vaping Use: Some days  Substance and Sexual Activity   Alcohol use: Never   Drug use: Never   Sexual activity: Yes    Partners: Female    Birth control/protection: Implant  Other Topics Concern   Not on file  Social History Narrative   Not on file   Social Determinants of Health   Financial Resource Strain: Not on file  Food Insecurity: Not on file  Transportation Needs: Not on file  Physical Activity: Not on file  Stress: Not on file  Social Connections: Not on file   SDOH:  SDOH Screenings   Depression (PHQ2-9): Low Risk  (10/28/2022)  Tobacco Use: High Risk (10/21/2022)   Additional Social History:                         Sleep: Good  Appetite:  Good  Current Medications:  Current Facility-Administered Medications  Medication Dose Route Frequency Provider Last Rate Last Admin   acetaminophen (TYLENOL) tablet 650 mg  650 mg Oral Q6H PRN Marlou Sa, NP   650 mg at 10/28/22 1054   alum  & mag hydroxide-simeth (MAALOX/MYLANTA) 200-200-20 MG/5ML suspension 30 mL  30 mL Oral Q4H PRN Marlou Sa, NP   30 mL at 10/26/22 1755   ARIPiprazole ER (ABILIFY MAINTENA) 400 MG prefilled syringe 400 mg  400 mg Intramuscular Q28 days Princess Bruins, DO   400 mg at 10/25/22 0920   cetaphil lotion   Topical PRN Princess Bruins, DO       fluticasone [facility]) 50 MCG/ACT nasal spray 1 spray  1 spray Each Nare QHS Princess Bruins, DO   1 spray at 10/30/22 2101   hydrOXYzine (ATARAX) tablet 25 mg  25 mg Oral TID PRN Lorri Frederick, MD   25 mg at 10/29/22 2115   ketoconazole (NIZORAL) 2 % cream   Topical BID Princess Bruins, DO   1 Application at 10/31/22 0915   levothyroxine (SYNTHROID) tablet 100 mcg  100 mcg Oral Once Byungura, Veronique M, NP       levothyroxine (SYNTHROID) tablet 100 mcg  100 mcg Oral Q0600 Marlou Sa, NP   100 mcg at 10/31/22 0605   lidocaine (LIDODERM) 5 % 1 patch  1 patch Transdermal Q24H Princess Bruins, DO   1 patch at 10/31/22 0949   loratadine (CLARITIN) tablet 10 mg  10 mg Oral Daily Carrion-Carrero, Karle Starch, MD   10 mg at 10/31/22 0914   magnesium hydroxide (MILK OF MAGNESIA) suspension 30 mL  30 mL Oral Daily PRN Marlou Sa, NP   30 mL at 10/12/22 2145   metFORMIN (GLUCOPHAGE) tablet 500 mg  500 mg Oral Q breakfast Carrion-Carrero, Margely, MD   500 mg at 10/31/22 1610   nicotine (NICODERM CQ - dosed in mg/24 hours) patch 14 mg  14 mg Transdermal Daily PRN Carrion-Carrero, Karle Starch, MD       ondansetron (ZOFRAN-ODT) disintegrating tablet 4 mg  4 mg Oral Q8H PRN Carrion-Carrero, Margely, MD   4 mg at 10/22/22 1806   Oxcarbazepine (TRILEPTAL) tablet 300 mg  300 mg Oral BID Byungura, Veronique M, NP   300 mg at 10/31/22 0914   pantoprazole (PROTONIX) EC tablet 40 mg  40 mg Oral Daily Carrion-Carrero, Karle Starch, MD   40 mg at 10/31/22 0915   polyethylene glycol (MIRALAX / GLYCOLAX) packet 17 g  17 g Oral Daily Princess Bruins, DO   17 g at 10/31/22  0914   QUEtiapine (SEROQUEL) tablet 400 mg  400 mg Oral BID Rayburn Go, Veronique M, NP   400 mg at 10/31/22 0915   sertraline (ZOLOFT) tablet 150 mg  150 mg Oral Daily Carrion-Carrero, Margely, MD   150 mg at 10/31/22 0914   sodium chloride (OCEAN) 0.65 % nasal spray 1 spray  1 spray Each Nare Daily Princess Bruins, DO   1 spray at 10/30/22 1013   traZODone (DESYREL) tablet 100 mg  100 mg Oral QHS Marlou Sa, NP   100  mg at 10/30/22 2101   traZODone (DESYREL) tablet 50 mg  50 mg Oral QHS PRN Marlou Sa, NP   50 mg at 10/28/22 2105   valACYclovir (VALTREX) tablet 500 mg  500 mg Oral Daily Carrion-Carrero, Margely, MD   500 mg at 10/31/22 0914   zinc oxide (BALMEX) 11.3 % cream   Topical PRN Princess Bruins, DO       Current Outpatient Medications  Medication Sig Dispense Refill   ABILIFY MAINTENA 400 MG PRSY prefilled syringe 400 mg every 28 (twenty-eight) days.     cetirizine (ZYRTEC) 10 MG tablet Take 10 mg by mouth daily.     cyclobenzaprine (FLEXERIL) 10 MG tablet Take 1 tablet (10 mg total) by mouth 2 (two) times daily as needed for muscle spasms. 20 tablet 0   fluticasone (FLONASE) 50 MCG/ACT nasal spray Place 1 spray into both nostrils daily.     meloxicam (MOBIC) 15 MG tablet Take 15 mg by mouth daily.     nitrofurantoin, macrocrystal-monohydrate, (MACROBID) 100 MG capsule Take 1 capsule (100 mg total) by mouth 2 (two) times daily. 10 capsule 0   ondansetron (ZOFRAN-ODT) 4 MG disintegrating tablet Take 1 tablet (4 mg total) by mouth every 8 (eight) hours as needed for nausea or vomiting. 20 tablet 0   Oxcarbazepine (TRILEPTAL) 300 MG tablet Take 1 tablet (300 mg total) by mouth 2 (two) times daily. 60 tablet 0   QUEtiapine (SEROQUEL) 400 MG tablet Take 1 tablet (400 mg total) by mouth 2 (two) times daily. 60 tablet 0   sertraline (ZOLOFT) 50 MG tablet Take 3 tablets (150 mg total) by mouth in the morning. 90 tablet 0   traZODone (DESYREL) 100 MG tablet Take 1 tablet (100  mg total) by mouth at bedtime. 30 tablet 0   valACYclovir (VALTREX) 500 MG tablet Take 500 mg by mouth daily.      Labs  Lab Results:  Admission on 10/07/2022  Component Date Value Ref Range Status   Hgb A1c MFr Bld 10/15/2022 5.7 (H)  4.8 - 5.6 % Final   Comment: (NOTE)         Prediabetes: 5.7 - 6.4         Diabetes: >6.4         Glycemic control for adults with diabetes: <7.0    Mean Plasma Glucose 10/15/2022 117  mg/dL Final   Comment: (NOTE) Performed At: [facility] [address] [place], [place] [id number] Jolene Schimke MD YJ:[id number]   Admission on 10/07/2022, Discharged on 10/07/2022  Component Date Value Ref Range Status   Color, Urine 10/07/2022 YELLOW  YELLOW Final   APPearance 10/07/2022 HAZY (A)  CLEAR Final   Specific Gravity, Urine 10/07/2022 1.011  1.005 - 1.030 Final   pH 10/07/2022 7.0  5.0 - 8.0 Final   Glucose, UA 10/07/2022 NEGATIVE  NEGATIVE mg/dL Final   Hgb urine dipstick 10/07/2022 SMALL (A)  NEGATIVE Final   Bilirubin Urine 10/07/2022 NEGATIVE  NEGATIVE Final   Ketones, ur 10/07/2022 NEGATIVE  NEGATIVE mg/dL Final   Protein, ur 63/78/5885 NEGATIVE  NEGATIVE mg/dL Final   Nitrite 02/77/4128 NEGATIVE  NEGATIVE Final   Leukocytes,Ua 10/07/2022 LARGE (A)  NEGATIVE Final   RBC / HPF 10/07/2022 0-5  0 - 5 RBC/hpf Final   WBC, UA 10/07/2022 0-5  0 - 5 WBC/hpf Final   Bacteria, UA 10/07/2022 FEW (A)  NONE SEEN Final   Squamous Epithelial / HPF 10/07/2022 11-20  0 - 5 Final  Performed at [facility], [address], [place], [place] [address]   I-stat hCG, quantitative 10/07/2022 <5.0  <5 mIU/mL Final   Comment 3 10/07/2022          Final   Comment:   GEST. AGE      CONC.  (mIU/mL)   <=1 WEEK        5 - 50     2 WEEKS       50 - 500     3 WEEKS       100 - 10,000     4 WEEKS     1,000 - 30,000        FEMALE AND NON-PREGNANT FEMALE:     LESS THAN 5 mIU/mL    Sodium 10/07/2022 138  135 - 145 mmol/L Final   Potassium 10/07/2022 4.3   3.5 - 5.1 mmol/L Final   Chloride 10/07/2022 104  98 - 111 mmol/L Final   CO2 10/07/2022 28  22 - 32 mmol/L Final   Glucose, Bld 10/07/2022 90  70 - 99 mg/dL Final   Glucose reference range applies only to samples taken after fasting for at least 8 hours.   BUN 10/07/2022 8  6 - 20 mg/dL Final   Creatinine, Ser 10/07/2022 0.96  0.44 - 1.00 mg/dL Final   Calcium 60/45/4098 9.1  8.9 - 10.3 mg/dL Final   GFR, Estimated 10/07/2022 >60  >60 mL/min Final   Comment: (NOTE) Calculated using the CKD-EPI Creatinine Equation (2021)    Anion gap 10/07/2022 6  5 - 15 Final   Performed at [facility], [address] [place], [place] [address]   WBC 10/07/2022 5.8  4.0 - 10.5 K/uL Final   RBC 10/07/2022 4.36  3.87 - 5.11 MIL/uL Final   Hemoglobin 10/07/2022 12.9  12.0 - 15.0 g/dL Final   HCT 78/29/5621 40.0  36.0 - 46.0 % Final   MCV 10/07/2022 91.7  80.0 - 100.0 fL Final   MCH 10/07/2022 29.6  26.0 - 34.0 pg Final   MCHC 10/07/2022 32.3  30.0 - 36.0 g/dL Final   RDW 30/86/5784 12.4  11.5 - 15.5 % Final   Platelets 10/07/2022 295  150 - 400 K/uL Final   nRBC 10/07/2022 0.0  0.0 - 0.2 % Final   Performed at [facility], [address] [place], [place] [address]   SARS Coronavirus 2 by RT PCR 10/07/2022 NEGATIVE  NEGATIVE Final   Comment: (NOTE) SARS-CoV-2 target nucleic acids are NOT DETECTED.  The SARS-CoV-2 RNA is generally detectable in upper respiratory specimens during the acute phase of infection. The lowest concentration of SARS-CoV-2 viral copies this assay can detect is 138 copies/mL. A negative result does not preclude SARS-Cov-2 infection and should not be used as the sole basis for treatment or other patient management decisions. A negative result may occur with  improper specimen collection/handling, submission of specimen other than nasopharyngeal swab, presence of viral mutation(s) within the areas targeted by this assay, and inadequate number of viral copies(<138  copies/mL). A negative result must be combined with clinical observations, patient history, and epidemiological information. The expected result is Negative.  Fact Sheet for Patients:  BloggerCourse.com  Fact Sheet for Healthcare Providers:  SeriousBroker.it  This test is no                          t yet approved or cleared by the [place]  FDA and  has been authorized for detection and/or diagnosis of SARS-CoV-2 by FDA under an Emergency Use Authorization (EUA). This EUA will remain  in effect (meaning this test can be used) for the duration of the COVID-19 declaration under Section 564(b)(1) of the Act, 21 U.S.C.section 360bbb-3(b)(1), unless the authorization is terminated  or revoked sooner.       Influenza A by PCR 10/07/2022 NEGATIVE  NEGATIVE Final   Influenza B by PCR 10/07/2022 NEGATIVE  NEGATIVE Final   Comment: (NOTE) The Xpert Xpress SARS-CoV-2/FLU/RSV plus assay is intended as an aid in the diagnosis of influenza from Nasopharyngeal swab specimens and should not be used as a sole basis for treatment. Nasal washings and aspirates are unacceptable for Xpert Xpress SARS-CoV-2/FLU/RSV testing.  Fact Sheet for Patients: BloggerCourse.com  Fact Sheet for Healthcare Providers: SeriousBroker.it  This test is not yet approved or cleared by the [place] FDA and has been authorized for detection and/or diagnosis of SARS-CoV-2 by FDA under an Emergency Use Authorization (EUA). This EUA will remain in effect (meaning this test can be used) for the duration of the COVID-19 declaration under Section 564(b)(1) of the Act, 21 U.S.C. section 360bbb-3(b)(1), unless the authorization is terminated or revoked.     Resp Syncytial Virus by PCR 10/07/2022 NEGATIVE  NEGATIVE Final   Comment: (NOTE) Fact Sheet for Patients: BloggerCourse.com  Fact  Sheet for Healthcare Providers: SeriousBroker.it  This test is not yet approved or cleared by the [place] FDA and has been authorized for detection and/or diagnosis of SARS-CoV-2 by FDA under an Emergency Use Authorization (EUA). This EUA will remain in effect (meaning this test can be used) for the duration of the COVID-19 declaration under Section 564(b)(1) of the Act, 21 U.S.C. section 360bbb-3(b)(1), unless the authorization is terminated or revoked.  Performed at [facility], [address] [place], [place] [address]    Specimen Description 10/07/2022 URINE, CLEAN CATCH   Final   Special Requests 10/07/2022    Final                   Value:NONE Performed at [facility], [address] [place], [place] [address]    Culture 10/07/2022 MULTIPLE SPECIES PRESENT, SUGGEST RECOLLECTION (A)   Final   Report Status 10/07/2022 10/08/2022 FINAL   Final  Admission on 07/25/2022, Discharged on 10/07/2022  Component Date Value Ref Range Status   SARS Coronavirus 2 by RT PCR 07/25/2022 NEGATIVE  NEGATIVE Final   Comment: (NOTE) SARS-CoV-2 target nucleic acids are NOT DETECTED.  The SARS-CoV-2 RNA is generally detectable in upper respiratory specimens during the acute phase of infection. The lowest concentration of SARS-CoV-2 viral copies this assay can detect is 138 copies/mL. A negative result does not preclude SARS-Cov-2 infection and should not be used as the sole basis for treatment or other patient management decisions. A negative result may occur with  improper specimen collection/handling, submission of specimen other than nasopharyngeal swab, presence of viral mutation(s) within the areas targeted by this assay, and inadequate number of viral copies(<138 copies/mL). A negative result must be combined with clinical observations, patient history, and epidemiological information. The expected result is Negative.  Fact Sheet for  Patients:  BloggerCourse.com  Fact Sheet for Healthcare Providers:  SeriousBroker.it  This test is no                          t yet approved or cleared by  the [employer] and  has been authorized for detection and/or diagnosis of SARS-CoV-2 by FDA under an Emergency Use Authorization (EUA). This EUA will remain  in effect (meaning this test can be used) for the duration of the COVID-19 declaration under Section 564(b)(1) of the Act, 21 U.S.C.section 360bbb-3(b)(1), unless the authorization is terminated  or revoked sooner.       Influenza A by PCR 07/25/2022 NEGATIVE  NEGATIVE Final   Influenza B by PCR 07/25/2022 NEGATIVE  NEGATIVE Final   Comment: (NOTE) The Xpert Xpress SARS-CoV-2/FLU/RSV plus assay is intended as an aid in the diagnosis of influenza from Nasopharyngeal swab specimens and should not be used as a sole basis for treatment. Nasal washings and aspirates are unacceptable for Xpert Xpress SARS-CoV-2/FLU/RSV testing.  Fact Sheet for Patients: EntrepreneurPulse.com.au  Fact Sheet for Healthcare Providers: IncredibleEmployment.be  This test is not yet approved or cleared by the [place] FDA and has been authorized for detection and/or diagnosis of SARS-CoV-2 by FDA under an Emergency Use Authorization (EUA). This EUA will remain in effect (meaning this test can be used) for the duration of the COVID-19 declaration under Section 564(b)(1) of the Act, 21 U.S.C. section 360bbb-3(b)(1), unless the authorization is terminated or revoked.  Performed at [facility], [place] [address] [place], [place] [address]    WBC 07/25/2022 8.3  4.0 - 10.5 K/uL Final   RBC 07/25/2022 4.44  3.87 - 5.11 MIL/uL Final   Hemoglobin 07/25/2022 13.7  12.0 - 15.0 g/dL Final   HCT 07/25/2022 40.2  36.0 - 46.0 % Final   MCV 07/25/2022 90.5  80.0 - 100.0 fL Final   MCH 07/25/2022 30.9   26.0 - 34.0 pg Final   MCHC 07/25/2022 34.1  30.0 - 36.0 g/dL Final   RDW 07/25/2022 12.2  11.5 - 15.5 % Final   Platelets 07/25/2022 248  150 - 400 K/uL Final   nRBC 07/25/2022 0.0  0.0 - 0.2 % Final   Neutrophils Relative % 07/25/2022 43  % Final   Neutro Abs 07/25/2022 3.6  1.7 - 7.7 K/uL Final   Lymphocytes Relative 07/25/2022 52  % Final   Lymphs Abs 07/25/2022 4.2 (H)  0.7 - 4.0 K/uL Final   Monocytes Relative 07/25/2022 5  % Final   Monocytes Absolute 07/25/2022 0.4  0.1 - 1.0 K/uL Final   Eosinophils Relative 07/25/2022 0  % Final   Eosinophils Absolute 07/25/2022 0.0  0.0 - 0.5 K/uL Final   Basophils Relative 07/25/2022 0  % Final   Basophils Absolute 07/25/2022 0.0  0.0 - 0.1 K/uL Final   Immature Granulocytes 07/25/2022 0  % Final   Abs Immature Granulocytes 07/25/2022 0.02  0.00 - 0.07 K/uL Final   Performed at [facility], [place] [address] [place], [place] [address]   Sodium 07/25/2022 138  135 - 145 mmol/L Final   Potassium 07/25/2022 4.0  3.5 - 5.1 mmol/L Final   Chloride 07/25/2022 104  98 - 111 mmol/L Final   CO2 07/25/2022 29  22 - 32 mmol/L Final   Glucose, Bld 07/25/2022 83  70 - 99 mg/dL Final   Glucose reference range applies only to samples taken after fasting for at least 8 hours.   BUN 07/25/2022 11  6 - 20 mg/dL Final   Creatinine, Ser 07/25/2022 0.97  0.44 - 1.00 mg/dL Final   Calcium 07/25/2022 9.2  8.9 - 10.3 mg/dL Final   Total Protein 07/25/2022 7.0  6.5 -  8.1 g/dL Final   Albumin 07/25/2022 3.8  3.5 - 5.0 g/dL Final   AST 07/25/2022 18  15 - 41 U/L Final   ALT 07/25/2022 22  0 - 44 U/L Final   Alkaline Phosphatase 07/25/2022 64  38 - 126 U/L Final   Total Bilirubin 07/25/2022 0.2 (L)  0.3 - 1.2 mg/dL Final   GFR, Estimated 07/25/2022 >60  >60 mL/min Final   Comment: (NOTE) Calculated using the CKD-EPI Creatinine Equation (2021)    Anion gap 07/25/2022 5  5 - 15 Final   Performed at [facility], [place] [address], [place], [place]  [address]   POC Amphetamine UR 07/25/2022 None Detected  NONE DETECTED (Cut Off Level 1000 ng/mL) Preliminary   POC Secobarbital (BAR) 07/25/2022 None Detected  NONE DETECTED (Cut Off Level 300 ng/mL) Preliminary   POC Buprenorphine (BUP) 07/25/2022 None Detected  NONE DETECTED (Cut Off Level 10 ng/mL) Preliminary   POC Oxazepam (BZO) 07/25/2022 None Detected  NONE DETECTED (Cut Off Level 300 ng/mL) Preliminary   POC Cocaine UR 07/25/2022 None Detected  NONE DETECTED (Cut Off Level 300 ng/mL) Preliminary   POC Methamphetamine UR 07/25/2022 None Detected  NONE DETECTED (Cut Off Level 1000 ng/mL) Preliminary   POC Morphine 07/25/2022 None Detected  NONE DETECTED (Cut Off Level 300 ng/mL) Preliminary   POC Methadone UR 07/25/2022 None Detected  NONE DETECTED (Cut Off Level 300 ng/mL) Preliminary   POC Oxycodone UR 07/25/2022 None Detected  NONE DETECTED (Cut Off Level 100 ng/mL) Preliminary   POC Marijuana UR 07/25/2022 None Detected  NONE DETECTED (Cut Off Level 50 ng/mL) Preliminary   SARSCOV2ONAVIRUS 2 AG 07/25/2022 NEGATIVE  NEGATIVE Final   Comment: (NOTE) SARS-CoV-2 antigen NOT DETECTED.   Negative results are presumptive.  Negative results do not preclude SARS-CoV-2 infection and should not be used as the sole basis for treatment or other patient management decisions, including infection  control decisions, particularly in the presence of clinical signs and  symptoms consistent with COVID-19, or in those who have been in contact with the virus.  Negative results must be combined with clinical observations, patient history, and epidemiological information. The expected result is Negative.  Fact Sheet for Patients: HandmadeRecipes.com.cy  Fact Sheet for Healthcare Providers: FuneralLife.at  This test is not yet approved or cleared by the [place] FDA and  has been authorized for detection and/or diagnosis of SARS-CoV-2 by FDA under an  Emergency Use Authorization (EUA).  This EUA will remain in effect (meaning this test can be used) for the duration of  the COV                          ID-19 declaration under Section 564(b)(1) of the Act, 21 U.S.C. section 360bbb-3(b)(1), unless the authorization is terminated or revoked sooner.     Cholesterol 07/25/2022 181  0 - 200 mg/dL Final   Triglycerides 07/25/2022 118  <150 mg/dL Final   HDL 07/25/2022 45  >40 mg/dL Final   Total CHOL/HDL Ratio 07/25/2022 4.0  RATIO Final   VLDL 07/25/2022 24  0 - 40 mg/dL Final   LDL Cholesterol 07/25/2022 112 (H)  0 - 99 mg/dL Final   Comment:        Total Cholesterol/HDL:CHD Risk Coronary Heart Disease Risk Table                     Men   Women  1/2 Average Risk  3.4   3.3  Average Risk       5.0   4.4  2 X Average Risk   9.6   7.1  3 X Average Risk  23.4   11.0        Use the calculated Patient Ratio above and the CHD Risk Table to determine the patient's CHD Risk.        ATP III CLASSIFICATION (LDL):  <100     mg/dL   Optimal  161-096  mg/dL   Near or Above                    Optimal  130-159  mg/dL   Borderline  045-409  mg/dL   High  >811     mg/dL   Very High Performed at [facility], [address] [place], [place] [address]    TSH 07/25/2022 6.668 (H)  0.350 - 4.500 uIU/mL Final   Comment: Performed by a 3rd Generation assay with a functional sensitivity of <=0.01 uIU/mL. Performed at [facility], [address] [place], [place] [address]    Glucose-Capillary 07/26/2022 104 (H)  70 - 99 mg/dL Final   Glucose reference range applies only to samples taken after fasting for at least 8 hours.   T3, Free 07/31/2022 2.3  2.0 - 4.4 pg/mL Final   Comment: (NOTE) Performed At: [facility] [address] [place], [place] [id number] Jolene Schimke MD ON:[id number]    Free T4 07/31/2022 0.60 (L)  0.61 - 1.12 ng/dL Final   Comment: (NOTE) Biotin ingestion may interfere with free T4 tests. If the  results are inconsistent with the TSH level, previous test results, or the clinical presentation, then consider biotin interference. If needed, order repeat testing after stopping biotin. Performed at [facility], [address], [place], [place] [address]    Glucose-Capillary 08/29/2022 100 (H)  70 - 99 mg/dL Final   Glucose reference range applies only to samples taken after fasting for at least 8 hours.   TSH 09/05/2022 0.793  0.350 - 4.500 uIU/mL Final   Comment: Performed by a 3rd Generation assay with a functional sensitivity of <=0.01 uIU/mL. Performed at [facility], [address] [place], [place] [address]    Free T4 09/05/2022 0.73  0.61 - 1.12 ng/dL Final   Comment: (NOTE) Biotin ingestion may interfere with free T4 tests. If the results are inconsistent with the TSH level, previous test results, or the clinical presentation, then consider biotin interference. If needed, order repeat testing after stopping biotin. Performed at [facility] [facility], [address] [place], [place] [address]    Preg Test, Ur 09/06/2022 Negative  Negative Final   Preg Test, Ur 09/06/2022 NEGATIVE  NEGATIVE Final   Comment:        THE SENSITIVITY OF THIS METHODOLOGY IS >24 mIU/mL    Preg Test, Ur 09/05/2022 NEGATIVE  NEGATIVE Final   Comment:        THE SENSITIVITY OF THIS METHODOLOGY IS >24 mIU/mL    Sodium 09/13/2022 136  135 - 145 mmol/L Final   Potassium 09/13/2022 4.5  3.5 - 5.1 mmol/L Final   Chloride 09/13/2022 105  98 - 111 mmol/L Final   CO2 09/13/2022 21 (L)  22 - 32 mmol/L Final   Glucose, Bld 09/13/2022 111 (H)  70 - 99 mg/dL Final   Glucose reference range applies only to samples taken after fasting for at least 8  hours.   BUN 09/13/2022 14  6 - 20 mg/dL Final   Creatinine, Ser 09/13/2022 0.92  0.44 - 1.00 mg/dL Final   Calcium 40/98/1191 9.1  8.9 - 10.3 mg/dL Final   GFR, Estimated 09/13/2022 >60  >60 mL/min Final   Comment: (NOTE) Calculated using the  CKD-EPI Creatinine Equation (2021)    Anion gap 09/13/2022 10  5 - 15 Final   Performed at [facility], [address] [place], [place] [address]   Vitamin B-12 09/13/2022 585  180 - 914 pg/mL Final   Comment: (NOTE) This assay is not validated for testing neonatal or myeloproliferative syndrome specimens for Vitamin B12 levels. Performed at [facility], [address], [place], [place] [address]    Color, Urine 09/14/2022 YELLOW  YELLOW Final   APPearance 09/14/2022 HAZY (A)  CLEAR Final   Specific Gravity, Urine 09/14/2022 1.025  1.005 - 1.030 Final   pH 09/14/2022 5.0  5.0 - 8.0 Final   Glucose, UA 09/14/2022 NEGATIVE  NEGATIVE mg/dL Final   Hgb urine dipstick 09/14/2022 NEGATIVE  NEGATIVE Final   Bilirubin Urine 09/14/2022 NEGATIVE  NEGATIVE Final   Ketones, ur 09/14/2022 NEGATIVE  NEGATIVE mg/dL Final   Protein, ur 08/65/7846 NEGATIVE  NEGATIVE mg/dL Final   Nitrite 96/29/5284 NEGATIVE  NEGATIVE Final   Leukocytes,Ua 09/14/2022 TRACE (A)  NEGATIVE Final   RBC / HPF 09/14/2022 0-5  0 - 5 RBC/hpf Final   WBC, UA 09/14/2022 0-5  0 - 5 WBC/hpf Final   Bacteria, UA 09/14/2022 RARE (A)  NONE SEEN Final   Squamous Epithelial / HPF 09/14/2022 0-5  0 - 5 Final   Mucus 09/14/2022 PRESENT   Final   Performed at [facility], [address], [place], [place] [address]   Glucose-Capillary 09/16/2022 105 (H)  70 - 99 mg/dL Final   Glucose reference range applies only to samples taken after fasting for at least 8 hours.   SARS Coronavirus 2 by RT PCR 09/30/2022 NEGATIVE  NEGATIVE Final   Comment: (NOTE) SARS-CoV-2 target nucleic acids are NOT DETECTED.  The SARS-CoV-2 RNA is generally detectable in upper respiratory specimens during the acute phase of infection. The lowest concentration of SARS-CoV-2 viral copies this assay can detect is 138 copies/mL. A negative result does not preclude SARS-Cov-2 infection and should not be used as the sole basis for treatment  or other patient management decisions. A negative result may occur with  improper specimen collection/handling, submission of specimen other than nasopharyngeal swab, presence of viral mutation(s) within the areas targeted by this assay, and inadequate number of viral copies(<138 copies/mL). A negative result must be combined with clinical observations, patient history, and epidemiological information. The expected result is Negative.  Fact Sheet for Patients:  BloggerCourse.com  Fact Sheet for Healthcare Providers:  SeriousBroker.it  This test is no                          t yet approved or cleared by the [place] FDA and  has been authorized for detection and/or diagnosis of SARS-CoV-2 by FDA under an Emergency Use Authorization (EUA). This EUA will remain  in effect (meaning this test can be used) for the duration of the COVID-19 declaration under Section 564(b)(1) of the Act, 21 U.S.C.section 360bbb-3(b)(1), unless the authorization is terminated  or revoked sooner.       Influenza A by PCR 09/30/2022 NEGATIVE  NEGATIVE Final  Influenza B by PCR 09/30/2022 NEGATIVE  NEGATIVE Final   Comment: (NOTE) The Xpert Xpress SARS-CoV-2/FLU/RSV plus assay is intended as an aid in the diagnosis of influenza from Nasopharyngeal swab specimens and should not be used as a sole basis for treatment. Nasal washings and aspirates are unacceptable for Xpert Xpress SARS-CoV-2/FLU/RSV testing.  Fact Sheet for Patients: BloggerCourse.com  Fact Sheet for Healthcare Providers: SeriousBroker.it  This test is not yet approved or cleared by the [place] FDA and has been authorized for detection and/or diagnosis of SARS-CoV-2 by FDA under an Emergency Use Authorization (EUA). This EUA will remain in effect (meaning this test can be used) for the duration of the COVID-19 declaration under  Section 564(b)(1) of the Act, 21 U.S.C. section 360bbb-3(b)(1), unless the authorization is terminated or revoked.     Resp Syncytial Virus by PCR 09/30/2022 NEGATIVE  NEGATIVE Final   Comment: (NOTE) Fact Sheet for Patients: BloggerCourse.com  Fact Sheet for Healthcare Providers: SeriousBroker.it  This test is not yet approved or cleared by the [place] FDA and has been authorized for detection and/or diagnosis of SARS-CoV-2 by FDA under an Emergency Use Authorization (EUA). This EUA will remain in effect (meaning this test can be used) for the duration of the COVID-19 declaration under Section 564(b)(1) of the Act, 21 U.S.C. section 360bbb-3(b)(1), unless the authorization is terminated or revoked.  Performed at [facility], [address], [place], [place] [address]    Sodium 10/01/2022 136  135 - 145 mmol/L Final   Potassium 10/01/2022 3.8  3.5 - 5.1 mmol/L Final   Chloride 10/01/2022 104  98 - 111 mmol/L Final   CO2 10/01/2022 27  22 - 32 mmol/L Final   Glucose, Bld 10/01/2022 98  70 - 99 mg/dL Final   Glucose reference range applies only to samples taken after fasting for at least 8 hours.   BUN 10/01/2022 12  6 - 20 mg/dL Final   Creatinine, Ser 10/01/2022 0.98  0.44 - 1.00 mg/dL Final   Calcium 60/45/4098 8.9  8.9 - 10.3 mg/dL Final   GFR, Estimated 10/01/2022 >60  >60 mL/min Final   Comment: (NOTE) Calculated using the CKD-EPI Creatinine Equation (2021)    Anion gap 10/01/2022 5  5 - 15 Final   Performed at [facility], [address] [place], [place] [address]   WBC 10/01/2022 5.1  4.0 - 10.5 K/uL Final   RBC 10/01/2022 4.31  3.87 - 5.11 MIL/uL Final   Hemoglobin 10/01/2022 12.7  12.0 - 15.0 g/dL Final   HCT 78/29/5621 38.8  36.0 - 46.0 % Final   MCV 10/01/2022 90.0  80.0 - 100.0 fL Final   MCH 10/01/2022 29.5  26.0 - 34.0 pg Final   MCHC 10/01/2022 32.7  30.0 - 36.0 g/dL Final   RDW  30/86/5784 12.4  11.5 - 15.5 % Final   Platelets 10/01/2022 300  150 - 400 K/uL Final   nRBC 10/01/2022 0.0  0.0 - 0.2 % Final   Performed at [facility], [address], [place], [place] [address]   Lipase 10/01/2022 29  11 - 51 U/L Final   Performed at [facility], [address], [place], [place] [address]   Color, Urine 10/05/2022 YELLOW  YELLOW Final   APPearance 10/05/2022 HAZY (A)  CLEAR Final   Specific Gravity, Urine 10/05/2022 1.018  1.005 - 1.030 Final   pH 10/05/2022 5.0  5.0 - 8.0 Final   Glucose,  UA 10/05/2022 NEGATIVE  NEGATIVE mg/dL Final   Hgb urine dipstick 10/05/2022 NEGATIVE  NEGATIVE Final   Bilirubin Urine 10/05/2022 NEGATIVE  NEGATIVE Final   Ketones, ur 10/05/2022 NEGATIVE  NEGATIVE mg/dL Final   Protein, ur 16/07/9603 NEGATIVE  NEGATIVE mg/dL Final   Nitrite 54/06/8118 NEGATIVE  NEGATIVE Final   Leukocytes,Ua 10/05/2022 TRACE (A)  NEGATIVE Final   RBC / HPF 10/05/2022 0-5  0 - 5 RBC/hpf Final   WBC, UA 10/05/2022 0-5  0 - 5 WBC/hpf Final   Bacteria, UA 10/05/2022 RARE (A)  NONE SEEN Final   Squamous Epithelial / HPF 10/05/2022 0-5  0 - 5 Final   Mucus 10/05/2022 PRESENT   Final   Performed at [facility], [address], [place], [place] [address]   Specimen Description 10/05/2022 URINE, CLEAN CATCH   Final   Special Requests 10/05/2022    Final                   Value:NONE Performed at [facility], [address], [place], [place] [address]    Culture 10/05/2022 10,000 COLONIES/mL MULTIPLE SPECIES PRESENT, SUGGEST RECOLLECTION (A)   Final   Report Status 10/05/2022 10/07/2022 FINAL   Final  Admission on 07/04/2022, Discharged on 07/05/2022  Component Date Value Ref Range Status   Sodium 07/04/2022 142  135 - 145 mmol/L Final   Potassium 07/04/2022 4.4  3.5 - 5.1 mmol/L Final   Chloride 07/04/2022 109  98 - 111 mmol/L Final   CO2 07/04/2022 26  22 - 32 mmol/L Final   Glucose, Bld 07/04/2022 96  70 - 99 mg/dL Final   Glucose  reference range applies only to samples taken after fasting for at least 8 hours.   BUN 07/04/2022 15  6 - 20 mg/dL Final   Creatinine, Ser 07/04/2022 0.83  0.44 - 1.00 mg/dL Final   Calcium 30/86/5784 9.3  8.9 - 10.3 mg/dL Final   Total Protein 69/62/9528 7.2  6.5 - 8.1 g/dL Final   Albumin 41/32/4401 3.9  3.5 - 5.0 g/dL Final   AST 02/72/5366 23  15 - 41 U/L Final   ALT 07/04/2022 28  0 - 44 U/L Final   Alkaline Phosphatase 07/04/2022 77  38 - 126 U/L Final   Total Bilirubin 07/04/2022 0.4  0.3 - 1.2 mg/dL Final   GFR, Estimated 07/04/2022 >60  >60 mL/min Final   Comment: (NOTE) Calculated using the CKD-EPI Creatinine Equation (2021)    Anion gap 07/04/2022 7  5 - 15 Final   Performed at [facility], [address], [place], [place] [address]   Alcohol, Ethyl (B) 07/04/2022 <10  <10 mg/dL Final   Comment: (NOTE) Lowest detectable limit for serum alcohol is 10 mg/dL.  For medical purposes only. Performed at [facility], [address] [place], [place] [address]    WBC 07/04/2022 7.0  4.0 - 10.5 K/uL Final   RBC 07/04/2022 4.18  3.87 - 5.11 MIL/uL Final   Hemoglobin 07/04/2022 12.8  12.0 - 15.0 g/dL Final   HCT 56/38/7564 39.1  36.0 - 46.0 % Final   MCV 07/04/2022 93.5  80.0 - 100.0 fL Final   MCH 07/04/2022 30.6  26.0 - 34.0 pg Final   MCHC 07/04/2022 32.7  30.0 - 36.0 g/dL Final   RDW 33/29/5188 12.9  11.5 - 15.5 % Final   Platelets 07/04/2022 243  150 - 400 K/uL Final   nRBC 07/04/2022 0.0  0.0 - 0.2 % Final   Neutrophils Relative % 07/04/2022 43  % Final   Neutro Abs 07/04/2022 3.0  1.7 - 7.7 K/uL Final   Lymphocytes Relative 07/04/2022 50  % Final   Lymphs Abs 07/04/2022 3.5  0.7 - 4.0 K/uL Final   Monocytes Relative 07/04/2022 7  % Final   Monocytes Absolute 07/04/2022 0.5  0.1 - 1.0 K/uL Final   Eosinophils Relative 07/04/2022 0  % Final   Eosinophils Absolute 07/04/2022 0.0  0.0 - 0.5 K/uL Final   Basophils Relative 07/04/2022  0  % Final   Basophils Absolute 07/04/2022 0.0  0.0 - 0.1 K/uL Final   Immature Granulocytes 07/04/2022 0  % Final   Abs Immature Granulocytes 07/04/2022 0.01  0.00 - 0.07 K/uL Final   Performed at [facility], [address], [place], [place] [address]   I-stat hCG, quantitative 07/04/2022 <5.0  <5 mIU/mL Final   Comment 3 07/04/2022          Final   Comment:   GEST. AGE      CONC.  (mIU/mL)   <=1 WEEK        5 - 50     2 WEEKS       50 - 500     3 WEEKS       100 - 10,000     4 WEEKS     1,000 - 30,000        FEMALE AND NON-PREGNANT FEMALE:     LESS THAN 5 mIU/mL   Admission on 06/14/2022, Discharged on 06/14/2022  Component Date Value Ref Range Status   Color, UA 06/14/2022 yellow  yellow Final   Clarity, UA 06/14/2022 cloudy (A)  clear Final   Glucose, UA 06/14/2022 negative  negative mg/dL Final   Bilirubin, UA 18/84/1660 negative  negative Final   Ketones, POC UA 06/14/2022 negative  negative mg/dL Final   Spec Grav, UA 63/10/6008 1.025  1.010 - 1.025 Final   Blood, UA 06/14/2022 negative  negative Final   pH, UA 06/14/2022 7.5  5.0 - 8.0 Final   Protein Ur, POC 06/14/2022 =30 (A)  negative mg/dL Final   Urobilinogen, UA 06/14/2022 0.2  0.2 or 1.0 E.U./dL Final   Nitrite, UA 93/23/5573 Negative  Negative Final   Leukocytes, UA 06/14/2022 Negative  Negative Final   Preg Test, Ur 06/14/2022 Negative  Negative Final   Specimen Description 06/14/2022 URINE, CLEAN CATCH   Final   Special Requests 06/14/2022    Final                   Value:NONE Performed at [facility], [address], [place], [place] [address]    Culture 06/14/2022 MULTIPLE SPECIES PRESENT, SUGGEST RECOLLECTION (A)   Final   Report Status 06/14/2022 06/16/2022 FINAL   Final  Admission on 06/07/2022, Discharged on 06/10/2022  Component Date Value Ref Range Status   SARS Coronavirus 2 by RT PCR 06/07/2022 NEGATIVE  NEGATIVE Final   Comment: (NOTE) SARS-CoV-2 target nucleic acids are  NOT DETECTED.  The SARS-CoV-2 RNA is generally detectable in upper respiratory specimens during the acute phase of infection. The lowest concentration of SARS-CoV-2 viral copies this assay can detect is 138 copies/mL. A negative result does not preclude SARS-Cov-2 infection and should not be used as the sole basis for treatment or other patient management decisions. A negative result may occur with  improper specimen collection/handling, submission of specimen other than nasopharyngeal  swab, presence of viral mutation(s) within the areas targeted by this assay, and inadequate number of viral copies(<138 copies/mL). A negative result must be combined with clinical observations, patient history, and epidemiological information. The expected result is Negative.  Fact Sheet for Patients:  BloggerCourse.com  Fact Sheet for Healthcare Providers:  SeriousBroker.it  This test is no                          t yet approved or cleared by the [place] FDA and  has been authorized for detection and/or diagnosis of SARS-CoV-2 by FDA under an Emergency Use Authorization (EUA). This EUA will remain  in effect (meaning this test can be used) for the duration of the COVID-19 declaration under Section 564(b)(1) of the Act, 21 U.S.C.section 360bbb-3(b)(1), unless the authorization is terminated  or revoked sooner.       Influenza A by PCR 06/07/2022 NEGATIVE  NEGATIVE Final   Influenza B by PCR 06/07/2022 NEGATIVE  NEGATIVE Final   Comment: (NOTE) The Xpert Xpress SARS-CoV-2/FLU/RSV plus assay is intended as an aid in the diagnosis of influenza from Nasopharyngeal swab specimens and should not be used as a sole basis for treatment. Nasal washings and aspirates are unacceptable for Xpert Xpress SARS-CoV-2/FLU/RSV testing.  Fact Sheet for Patients: BloggerCourse.com  Fact Sheet for Healthcare  Providers: SeriousBroker.it  This test is not yet approved or cleared by the [place] FDA and has been authorized for detection and/or diagnosis of SARS-CoV-2 by FDA under an Emergency Use Authorization (EUA). This EUA will remain in effect (meaning this test can be used) for the duration of the COVID-19 declaration under Section 564(b)(1) of the Act, 21 U.S.C. section 360bbb-3(b)(1), unless the authorization is terminated or revoked.  Performed at [facility], [address] [place], [place] [address]    WBC 06/07/2022 5.7  4.0 - 10.5 K/uL Final   RBC 06/07/2022 4.39  3.87 - 5.11 MIL/uL Final   Hemoglobin 06/07/2022 13.2  12.0 - 15.0 g/dL Final   HCT 60/45/4098 40.4  36.0 - 46.0 % Final   MCV 06/07/2022 92.0  80.0 - 100.0 fL Final   MCH 06/07/2022 30.1  26.0 - 34.0 pg Final   MCHC 06/07/2022 32.7  30.0 - 36.0 g/dL Final   RDW 11/91/4782 12.4  11.5 - 15.5 % Final   Platelets 06/07/2022 308  150 - 400 K/uL Final   nRBC 06/07/2022 0.0  0.0 - 0.2 % Final   Neutrophils Relative % 06/07/2022 42  % Final   Neutro Abs 06/07/2022 2.4  1.7 - 7.7 K/uL Final   Lymphocytes Relative 06/07/2022 54  % Final   Lymphs Abs 06/07/2022 3.1  0.7 - 4.0 K/uL Final   Monocytes Relative 06/07/2022 4  % Final   Monocytes Absolute 06/07/2022 0.2  0.1 - 1.0 K/uL Final   Eosinophils Relative 06/07/2022 0  % Final   Eosinophils Absolute 06/07/2022 0.0  0.0 - 0.5 K/uL Final   Basophils Relative 06/07/2022 0  % Final   Basophils Absolute 06/07/2022 0.0  0.0 - 0.1 K/uL Final   Immature Granulocytes 06/07/2022 0  % Final   Abs Immature Granulocytes 06/07/2022 0.01  0.00 - 0.07 K/uL Final   Performed at [facility], [address] [place], [place] [address]   Sodium 06/07/2022 139  135 - 145 mmol/L Final   Potassium 06/07/2022 4.0  3.5 - 5.1 mmol/L Final   Chloride 06/07/2022 104  98 - 111 mmol/L Final   CO2 06/07/2022 28  22 - 32 mmol/L Final   Glucose, Bld  06/07/2022 104 (H)  70 - 99 mg/dL Final   Glucose reference range applies only to samples taken after fasting for at least 8 hours.   BUN 06/07/2022 8  6 - 20 mg/dL Final   Creatinine, Ser 06/07/2022 0.84  0.44 - 1.00 mg/dL Final   Calcium 16/07/9603 9.1  8.9 - 10.3 mg/dL Final   Total Protein 54/06/8118 6.9  6.5 - 8.1 g/dL Final   Albumin 14/78/2956 3.7  3.5 - 5.0 g/dL Final   AST 21/30/8657 19  15 - 41 U/L Final   ALT 06/07/2022 22  0 - 44 U/L Final   Alkaline Phosphatase 06/07/2022 54  38 - 126 U/L Final   Total Bilirubin 06/07/2022 0.4  0.3 - 1.2 mg/dL Final   GFR, Estimated 06/07/2022 >60  >60 mL/min Final   Comment: (NOTE) Calculated using the CKD-EPI Creatinine Equation (2021)    Anion gap 06/07/2022 7  5 - 15 Final   Performed at [facility], [address], [place], [place] [address]   Hgb A1c MFr Bld 06/07/2022 5.0  4.8 - 5.6 % Final   Comment: (NOTE) Pre diabetes:          5.7%-6.4%  Diabetes:              >6.4%  Glycemic control for   <7.0% adults with diabetes    Mean Plasma Glucose 06/07/2022 96.8  mg/dL Final   Performed at [facility], [address], [place], [place] [address]   TSH 06/07/2022 1.620  0.350 - 4.500 uIU/mL Final   Comment: Performed by a 3rd Generation assay with a functional sensitivity of <=0.01 uIU/mL. Performed at [facility], [address] [place], [place] [address]    RPR Ser Ql 06/07/2022 NON REACTIVE  NON REACTIVE Final   Performed at [facility], [address] [place], [place] [address]   Color, Urine 06/07/2022 YELLOW  YELLOW Final   APPearance 06/07/2022 HAZY (A)  CLEAR Final   Specific Gravity, Urine 06/07/2022 1.018  1.005 - 1.030 Final   pH 06/07/2022 7.0  5.0 - 8.0 Final   Glucose, UA 06/07/2022 NEGATIVE  NEGATIVE mg/dL Final   Hgb urine dipstick 06/07/2022 NEGATIVE  NEGATIVE Final   Bilirubin Urine 06/07/2022 NEGATIVE  NEGATIVE Final   Ketones, ur 06/07/2022 NEGATIVE  NEGATIVE mg/dL Final    Protein, ur 72/53/6644 NEGATIVE  NEGATIVE mg/dL Final   Nitrite 03/47/4259 NEGATIVE  NEGATIVE Final   Leukocytes,Ua 06/07/2022 NEGATIVE  NEGATIVE Final   Performed at [facility], [address] [place], [place] [address]   Cholesterol 06/07/2022 164  0 - 200 mg/dL Final   Triglycerides 56/43/3295 158 (H)  <150 mg/dL Final   HDL 18/84/1660 44  >40 mg/dL Final   Total CHOL/HDL Ratio 06/07/2022 3.7  RATIO Final   VLDL 06/07/2022 32  0 - 40 mg/dL Final   LDL Cholesterol 06/07/2022 88  0 - 99 mg/dL Final   Comment:        Total Cholesterol/HDL:CHD Risk Coronary Heart Disease Risk Table                     Men   Women  1/2 Average Risk   3.4   3.3  Average Risk       5.0   4.4  2 X  Average Risk   9.6   7.1  3 X Average Risk  23.4   11.0        Use the calculated Patient Ratio above and the CHD Risk Table to determine the patient's CHD Risk.        ATP III CLASSIFICATION (LDL):  <100     mg/dL   Optimal  [phone]  mg/dL   Near or Above                    Optimal  130-159  mg/dL   Borderline  [phone]  mg/dL   High  >595>190     mg/dL   Very High Performed at [facility], [address] [place], [place] [address]    HIV Screen 4th Generation wRfx 06/07/2022 Non Reactive  Non Reactive Final   Performed at [facility], [address] [place], [place] [address]   SARSCOV2ONAVIRUS 2 AG 06/07/2022 NEGATIVE  NEGATIVE Final   Comment: (NOTE) SARS-CoV-2 antigen NOT DETECTED.   Negative results are presumptive.  Negative results do not preclude SARS-CoV-2 infection and should not be used as the sole basis for treatment or other patient management decisions, including infection  control decisions, particularly in the presence of clinical signs and  symptoms consistent with COVID-19, or in those who have been in contact with the virus.  Negative results must be combined with clinical observations, patient history, and epidemiological information. The expected result is  Negative.  Fact Sheet for Patients: https://www.jennings-kim.com/https://www.fda.gov/media/141569/download  Fact Sheet for Healthcare Providers: https://alexander-rogers.biz/https://www.fda.gov/media/141568/download  This test is not yet approved or cleared by the [place] FDA and  has been authorized for detection and/or diagnosis of SARS-CoV-2 by FDA under an Emergency Use Authorization (EUA).  This EUA will remain in effect (meaning this test can be used) for the duration of  the COV                          ID-19 declaration under Section 564(b)(1) of the Act, 21 U.S.C. section 360bbb-3(b)(1), unless the authorization is terminated or revoked sooner.     POC Amphetamine UR 06/07/2022 None Detected  NONE DETECTED (Cut Off Level 1000 ng/mL) Final   POC Secobarbital (BAR) 06/07/2022 None Detected  NONE DETECTED (Cut Off Level 300 ng/mL) Final   POC Buprenorphine (BUP) 06/07/2022 None Detected  NONE DETECTED (Cut Off Level 10 ng/mL) Final   POC Oxazepam (BZO) 06/07/2022 None Detected  NONE DETECTED (Cut Off Level 300 ng/mL) Final   POC Cocaine UR 06/07/2022 None Detected  NONE DETECTED (Cut Off Level 300 ng/mL) Final   POC Methamphetamine UR 06/07/2022 None Detected  NONE DETECTED (Cut Off Level 1000 ng/mL) Final   POC Morphine 06/07/2022 None Detected  NONE DETECTED (Cut Off Level 300 ng/mL) Final   POC Methadone UR 06/07/2022 None Detected  NONE DETECTED (Cut Off Level 300 ng/mL) Final   POC Oxycodone UR 06/07/2022 None Detected  NONE DETECTED (Cut Off Level 100 ng/mL) Final   POC Marijuana UR 06/07/2022 None Detected  NONE DETECTED (Cut Off Level 50 ng/mL) Final   Preg Test, Ur 06/08/2022 NEGATIVE  NEGATIVE Final   Comment:        THE SENSITIVITY OF THIS METHODOLOGY IS >20 mIU/mL. Performed at [facility], [address] [place], [place] [address]    Preg Test, Ur 06/08/2022 NEGATIVE  NEGATIVE Final   Comment:  THE SENSITIVITY OF THIS METHODOLOGY IS >24 mIU/mL     Blood Alcohol level:  Lab Results  Component  Value Date   ETH <10 07/04/2022   ETH <10 02/07/2022    Metabolic Disorder Labs: Lab Results  Component Value Date   HGBA1C 5.7 (H) 10/15/2022   MPG 117 10/15/2022   MPG 96.8 06/07/2022   No results found for: "PROLACTIN" Lab Results  Component Value Date   CHOL 181 07/25/2022   TRIG 118 07/25/2022   HDL 45 07/25/2022   CHOLHDL 4.0 07/25/2022   VLDL 24 07/25/2022   LDLCALC 112 (H) 07/25/2022   LDLCALC 88 06/07/2022    Therapeutic Lab Levels: No results found for: "LITHIUM" No results found for: "VALPROATE" No results found for: "CBMZ"  Physical Findings   GAD-7    Flowsheet Row [facility]  Total GAD-7 Score 17      PHQ2-9    Flowsheet Row [facility]  PHQ-2 Total Score 0 1 2  PHQ-9 Total Score -- 2 8      Flowsheet Row [facility]20[facility] Most recent reading at 10/27/2022  9:31 AM ED [facility] Most recent reading at 10/07/2022 12:18 PM ED [facility]20[facility] Most recent reading at 10/06/2022  9:38 AM  C-SSRS RISK CATEGORY No Risk No Risk No Risk        Musculoskeletal  Strength & Muscle Tone: within normal limits Gait & Station: normal Patient leans: N/A  Psychiatric Specialty Exam  Presentation  General Appearance:  Appropriate for Environment; Casual  Eye Contact: Good  Speech: Clear and Coherent; Normal Rate  Speech Volume: Normal  Handedness: Right   Mood and Affect  Mood: Euthymic  Affect: Appropriate; Congruent   Thought Process  Thought Processes: Coherent; Goal Directed  Descriptions of Associations:Intact  Orientation:Full (Time, Place and  Person)  Thought Content:Logical; WDL  Diagnosis of Schizophrenia or Schizoaffective disorder in past: Yes    Hallucinations:Hallucinations: None  Ideas of Reference:None  Suicidal Thoughts:Suicidal Thoughts: No  Homicidal Thoughts:Homicidal Thoughts: No   Sensorium  Memory: Immediate Fair; Recent Fair  Judgment: Intact  Insight: Present   Executive Functions  Concentration: Fair  Attention Span: Fair  Recall: [employer]  Fund of Knowledge: Fair  Language: Fair   Psychomotor Activity  Psychomotor Activity: Psychomotor Activity: Normal   Assets  Assets: Communication Skills; Desire for Improvement; Physical Health; Resilience; Social Support   Sleep  Sleep: Sleep: Good   No data recorded  Physical Exam  Physical Exam Vitals and nursing note reviewed.  Constitutional:      Appearance: Normal appearance. She is well-developed.  HENT:     Head: Normocephalic and atraumatic.     Nose: Nose normal.  Cardiovascular:     Rate and Rhythm: Normal rate.  Pulmonary:     Effort: Pulmonary effort is normal.  Musculoskeletal:        General: Normal range of motion.     Cervical back: Normal range of motion.  Skin:    General: Skin is warm and dry.  Neurological:     Mental Status: She is alert and oriented to person, place, and time.  Psychiatric:  Attention and Perception: Attention and perception normal.        Mood and Affect: Mood and affect normal.        Speech: Speech normal.        Behavior: Behavior normal. Behavior is cooperative.        Thought Content: Thought content normal.        Cognition and Memory: Cognition and memory normal.   Review of Systems  Constitutional: Negative.   HENT: Negative.    Eyes: Negative.   Respiratory: Negative.    Cardiovascular: Negative.   Gastrointestinal: Negative.   Genitourinary: Negative.   Musculoskeletal: Negative.   Skin: Negative.   Neurological: Negative.   Psychiatric/Behavioral:  Negative.     Blood pressure 110/70, pulse (!) 103, temperature 98.3 F (36.8 C), temperature source Oral, resp. rate 12, height 5\' 1"  (1.549 m), weight 198 lb (89.8 kg), SpO2 100 %. Body mass index is 37.41 kg/m.  Treatment Plan Summary: Patient reviewed with Dr. Nelly RoutArchana Kumar.  Patient remains cleared by psychiatry.  TOC team continues to seek disposition.  Daily contact with patient to assess and evaluate symptoms and progress in treatment  Lenard Lanceina L Priyansh Pry, FNP 10/31/2022 10:17 AM

## 2022-10-31 NOTE — ED Notes (Signed)
Pt is awake at this time.  She was given hygiene and supplies for a shower.

## 2022-10-31 NOTE — Care Management (Signed)
Treatment Team Meeting   In attendance  Nicole [name] Nicole [name] - IDD Care Manager  [place] Supervisor  [name] Manata (Nicole [name]) reported that [facility] are [facility] agencies can provide temporary respite for the [name] until [name] has completed the process to be a in network provider with [facility].   The IDD Care Manager (Nicole [name]) will submit referrals for placement to these two agencies.   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     [name] must be enrolled into [employer] so that the [name] to be considered a contracted in network provider.  [name] is in the process of submitting the paperwork for approval from [place].   The next Team Meeting is on Monday January 22 at 1pm

## 2022-10-31 NOTE — ED Notes (Signed)
Pt asleep at this hour. No apparent distress. RR even and unlabored. Monitored for safety.

## 2022-10-31 NOTE — ED Notes (Signed)
Pt sleeping@this time. Breathing even and unlabored. Will continue to monitor for saftey 

## 2022-10-31 NOTE — ED Notes (Signed)
Pt sleeping quietly, breathing even and unlabored.  Staff will continue to monitor for safety.  

## 2022-10-31 NOTE — ED Notes (Addendum)
Pt alert and watching television at this hour. No apparent distress. RR even and unlabored. Pt stated she got some good news today that she may be discharging soon. Expressed some anxiety and depression but pleasant and looking forward to the future. Monitored for safety.

## 2022-11-01 DIAGNOSIS — E039 Hypothyroidism, unspecified: Secondary | ICD-10-CM | POA: Diagnosis not present

## 2022-11-01 DIAGNOSIS — F333 Major depressive disorder, recurrent, severe with psychotic symptoms: Secondary | ICD-10-CM | POA: Diagnosis not present

## 2022-11-01 DIAGNOSIS — Z1152 Encounter for screening for COVID-19: Secondary | ICD-10-CM | POA: Diagnosis not present

## 2022-11-01 DIAGNOSIS — F419 Anxiety disorder, unspecified: Secondary | ICD-10-CM | POA: Diagnosis not present

## 2022-11-01 DIAGNOSIS — E118 Type 2 diabetes mellitus with unspecified complications: Secondary | ICD-10-CM | POA: Diagnosis not present

## 2022-11-01 DIAGNOSIS — F209 Schizophrenia, unspecified: Secondary | ICD-10-CM | POA: Diagnosis not present

## 2022-11-01 DIAGNOSIS — F17200 Nicotine dependence, unspecified, uncomplicated: Secondary | ICD-10-CM | POA: Diagnosis not present

## 2022-11-01 DIAGNOSIS — N390 Urinary tract infection, site not specified: Secondary | ICD-10-CM | POA: Diagnosis not present

## 2022-11-01 DIAGNOSIS — N9489 Other specified conditions associated with female genital organs and menstrual cycle: Secondary | ICD-10-CM | POA: Diagnosis not present

## 2022-11-01 LAB — POC SARS CORONAVIRUS 2 AG: SARSCOV2ONAVIRUS 2 AG: NEGATIVE

## 2022-11-01 NOTE — ED Notes (Signed)
Pt lying down at this hour. Says she feels nauseous. Given PRN meds as appropriate. RR even and unlabored. Monitored for safety.

## 2022-11-01 NOTE — ED Provider Notes (Signed)
Behavioral Health Progress Note  Date and Time: 11/01/2022 8:37 AM Name: Nicole [name] MRN:  [id number]  Subjective: Patient was initially seen laying in the recliner awake, no acute distress. Reported that she has started her menstrual cycle and is feeling tired. Otherwise reported no other concerns and has no other questions. Patient denied SI/HI/AVH, paranoia, contracted to safety. Sleep and appetite are still good.   Nicole [name] is a 32 y.o. female, with PMH of SCZ unspecified, MDD with psychosis, who presented voluntary to [facility] (07/25/2022) via [facility] after a verbal altercation with [name] (not patient's legal guardian) for transient SI.  This is patient's fourth visit to [facility] for similar concerns this year. Patient has been dismissed from previous group home due to threatening SI and HI, then leaving the group home.   Legal Guardian: [name]) transitioning to be [name]) Point of contact: [name])     Diagnosis:  Final diagnoses:  Tobacco use disorder  Schizophrenia, unspecified type ([place])  MDD (major depressive disorder), recurrent episode, moderate ([place])  Hypothyroidism, unspecified type    Total Time spent with patient: 20 minutes  Past Psychiatric History: see H & P Past Medical History:  Past Medical History:  Diagnosis Date   Anxiety    Depression    Hypothyroidism 08/07/2022   Tobacco use disorder 08/07/2022    Past Surgical History:  Procedure Laterality Date   WISDOM TOOTH EXTRACTION Bilateral 2020   Family History:  Family History  Problem Relation Age of Onset   Hypertension Father    Diabetes Father    Family Psychiatric  History: none reported Social History:  Social History   Substance and Sexual Activity  Alcohol Use Never     Social History   Substance and Sexual Activity  Drug Use Never    Social History   Socioeconomic History   Marital status: Single    Spouse name: Not on file   Number of  children: Not on file   Years of education: Not on file   Highest education level: Not on file  Occupational History   Not on file  Tobacco Use   Smoking status: Every Day    Types: Cigarettes   Smokeless tobacco: Never  Vaping Use   Vaping Use: Some days  Substance and Sexual Activity   Alcohol use: Never   Drug use: Never   Sexual activity: Yes    Partners: Female    Birth control/protection: Implant  Other Topics Concern   Not on file  Social History Narrative   Not on file   Social Determinants of Health   Financial Resource Strain: Not on file  Food Insecurity: Not on file  Transportation Needs: Not on file  Physical Activity: Not on file  Stress: Not on file  Social Connections: Not on file   SDOH:  SDOH Screenings   Depression (PHQ2-9): Low Risk  (10/28/2022)  Tobacco Use: High Risk (10/21/2022)   Additional Social History:                         Sleep: Good  Appetite:  Good  Current Medications:  Current Facility-Administered Medications  Medication Dose Route Frequency Provider Last Rate Last Admin   acetaminophen (TYLENOL) tablet 650 mg  650 mg Oral Q6H PRN Haynes Kerns, NP   650 mg at 10/28/22 1054   alum & mag hydroxide-simeth (MAALOX/MYLANTA) 200-200-20 MG/5ML suspension 30 mL  30 mL Oral Q4H PRN  Marlou Sa, NP   30 mL at 10/26/22 1755   ARIPiprazole ER (ABILIFY MAINTENA) 400 MG prefilled syringe 400 mg  400 mg Intramuscular Q28 days Princess Bruins, DO   400 mg at 10/25/22 0920   cetaphil lotion   Topical PRN Princess Bruins, DO       fluticasone [facility]) 50 MCG/ACT nasal spray 1 spray  1 spray Each Nare QHS Princess Bruins, DO   1 spray at 10/31/22 2117   hydrOXYzine (ATARAX) tablet 25 mg  25 mg Oral TID PRN Lorri Frederick, MD   25 mg at 10/29/22 2115   ketoconazole (NIZORAL) 2 % cream   Topical BID Princess Bruins, DO   Given at 10/31/22 2117   levothyroxine (SYNTHROID) tablet 100 mcg  100 mcg Oral Once Byungura,  Veronique M, NP       levothyroxine (SYNTHROID) tablet 100 mcg  100 mcg Oral Q0600 Marlou Sa, NP   100 mcg at 11/01/22 0605   lidocaine (LIDODERM) 5 % 1 patch  1 patch Transdermal Q24H Princess Bruins, DO   1 patch at 10/31/22 0949   loratadine (CLARITIN) tablet 10 mg  10 mg Oral Daily Carrion-Carrero, Karle Starch, MD   10 mg at 10/31/22 0914   magnesium hydroxide (MILK OF MAGNESIA) suspension 30 mL  30 mL Oral Daily PRN Marlou Sa, NP   30 mL at 10/12/22 2145   metFORMIN (GLUCOPHAGE) tablet 500 mg  500 mg Oral Q breakfast Carrion-Carrero, Karle Starch, MD   500 mg at 10/31/22 1610   nicotine (NICODERM CQ - dosed in mg/24 hours) patch 14 mg  14 mg Transdermal Daily PRN Carrion-Carrero, Karle Starch, MD       ondansetron (ZOFRAN-ODT) disintegrating tablet 4 mg  4 mg Oral Q8H PRN Carrion-Carrero, Margely, MD   4 mg at 10/22/22 1806   Oxcarbazepine (TRILEPTAL) tablet 300 mg  300 mg Oral BID Byungura, Veronique M, NP   300 mg at 10/31/22 2117   pantoprazole (PROTONIX) EC tablet 40 mg  40 mg Oral Daily Carrion-Carrero, Karle Starch, MD   40 mg at 10/31/22 0915   polyethylene glycol (MIRALAX / GLYCOLAX) packet 17 g  17 g Oral Daily Princess Bruins, DO   17 g at 10/31/22 0914   QUEtiapine (SEROQUEL) tablet 400 mg  400 mg Oral BID Rayburn Go, Veronique M, NP   400 mg at 10/31/22 2117   sertraline (ZOLOFT) tablet 150 mg  150 mg Oral Daily Carrion-Carrero, Karle Starch, MD   150 mg at 10/31/22 0914   sodium chloride (OCEAN) 0.65 % nasal spray 1 spray  1 spray Each Nare Daily Princess Bruins, DO   1 spray at 10/30/22 1013   traZODone (DESYREL) tablet 100 mg  100 mg Oral QHS Olin Pia M, NP   100 mg at 10/31/22 2117   traZODone (DESYREL) tablet 50 mg  50 mg Oral QHS PRN Marlou Sa, NP   50 mg at 10/28/22 2105   valACYclovir (VALTREX) tablet 500 mg  500 mg Oral Daily Carrion-Carrero, Karle Starch, MD   500 mg at 10/31/22 0914   zinc oxide (BALMEX) 11.3 % cream   Topical PRN Princess Bruins, DO        Current Outpatient Medications  Medication Sig Dispense Refill   ABILIFY MAINTENA 400 MG PRSY prefilled syringe 400 mg every 28 (twenty-eight) days.     cetirizine (ZYRTEC) 10 MG tablet Take 10 mg by mouth daily.     cyclobenzaprine (FLEXERIL) 10 MG tablet Take 1 tablet (10 mg total) by  mouth 2 (two) times daily as needed for muscle spasms. 20 tablet 0   fluticasone (FLONASE) 50 MCG/ACT nasal spray Place 1 spray into both nostrils daily.     meloxicam (MOBIC) 15 MG tablet Take 15 mg by mouth daily.     nitrofurantoin, macrocrystal-monohydrate, (MACROBID) 100 MG capsule Take 1 capsule (100 mg total) by mouth 2 (two) times daily. 10 capsule 0   ondansetron (ZOFRAN-ODT) 4 MG disintegrating tablet Take 1 tablet (4 mg total) by mouth every 8 (eight) hours as needed for nausea or vomiting. 20 tablet 0   Oxcarbazepine (TRILEPTAL) 300 MG tablet Take 1 tablet (300 mg total) by mouth 2 (two) times daily. 60 tablet 0   QUEtiapine (SEROQUEL) 400 MG tablet Take 1 tablet (400 mg total) by mouth 2 (two) times daily. 60 tablet 0   sertraline (ZOLOFT) 50 MG tablet Take 3 tablets (150 mg total) by mouth in the morning. 90 tablet 0   traZODone (DESYREL) 100 MG tablet Take 1 tablet (100 mg total) by mouth at bedtime. 30 tablet 0   valACYclovir (VALTREX) 500 MG tablet Take 500 mg by mouth daily.      Labs  Lab Results:  Admission on 10/07/2022  Component Date Value Ref Range Status   Hgb A1c MFr Bld 10/15/2022 5.7 (H)  4.8 - 5.6 % Final   Comment: (NOTE)         Prediabetes: 5.7 - 6.4         Diabetes: >6.4         Glycemic control for adults with diabetes: <7.0    Mean Plasma Glucose 10/15/2022 117  mg/dL Final   Comment: (NOTE) Performed At: [facility] [place], [place] [id number] Rush Farmer MD WL:[id number]   Admission on 10/07/2022, Discharged on 10/07/2022  Component Date Value Ref Range Status   Color, Urine 10/07/2022 YELLOW  YELLOW Final   APPearance 10/07/2022  HAZY (A)  CLEAR Final   Specific Gravity, Urine 10/07/2022 1.011  1.005 - 1.030 Final   pH 10/07/2022 7.0  5.0 - 8.0 Final   Glucose, UA 10/07/2022 NEGATIVE  NEGATIVE mg/dL Final   Hgb urine dipstick 10/07/2022 SMALL (A)  NEGATIVE Final   Bilirubin Urine 10/07/2022 NEGATIVE  NEGATIVE Final   Ketones, ur 10/07/2022 NEGATIVE  NEGATIVE mg/dL Final   Protein, ur 10/07/2022 NEGATIVE  NEGATIVE mg/dL Final   Nitrite 10/07/2022 NEGATIVE  NEGATIVE Final   Leukocytes,Ua 10/07/2022 LARGE (A)  NEGATIVE Final   RBC / HPF 10/07/2022 0-5  0 - 5 RBC/hpf Final   WBC, UA 10/07/2022 0-5  0 - 5 WBC/hpf Final   Bacteria, UA 10/07/2022 FEW (A)  NONE SEEN Final   Squamous Epithelial / HPF 10/07/2022 11-20  0 - 5 Final   Performed at [facility], [place], [place], [place] [address]   I-stat hCG, quantitative 10/07/2022 <5.0  <5 mIU/mL Final   Comment 3 10/07/2022          Final   Comment:   GEST. AGE      CONC.  (mIU/mL)   <=1 WEEK        5 - 50     2 WEEKS       50 - 500     3 WEEKS       100 - 10,000     4 WEEKS     1,000 - 30,000        FEMALE AND NON-PREGNANT FEMALE:  LESS THAN 5 mIU/mL    Sodium 10/07/2022 138  135 - 145 mmol/L Final   Potassium 10/07/2022 4.3  3.5 - 5.1 mmol/L Final   Chloride 10/07/2022 104  98 - 111 mmol/L Final   CO2 10/07/2022 28  22 - 32 mmol/L Final   Glucose, Bld 10/07/2022 90  70 - 99 mg/dL Final   Glucose reference range applies only to samples taken after fasting for at least 8 hours.   BUN 10/07/2022 8  6 - 20 mg/dL Final   Creatinine, Ser 10/07/2022 0.96  0.44 - 1.00 mg/dL Final   Calcium 16/10/960412/25/2023 9.1  8.9 - 10.3 mg/dL Final   GFR, Estimated 10/07/2022 >60  >60 mL/min Final   Comment: (NOTE) Calculated using the CKD-EPI Creatinine Equation (2021)    Anion gap 10/07/2022 6  5 - 15 Final   Performed at [facility], [address] [place], [place] [address]   WBC 10/07/2022 5.8  4.0 - 10.5 K/uL Final   RBC 10/07/2022 4.36  3.87 - 5.11 MIL/uL  Final   Hemoglobin 10/07/2022 12.9  12.0 - 15.0 g/dL Final   HCT 11/91/478212/25/2023 40.0  36.0 - 46.0 % Final   MCV 10/07/2022 91.7  80.0 - 100.0 fL Final   MCH 10/07/2022 29.6  26.0 - 34.0 pg Final   MCHC 10/07/2022 32.3  30.0 - 36.0 g/dL Final   RDW 95/62/130812/25/2023 12.4  11.5 - 15.5 % Final   Platelets 10/07/2022 295  150 - 400 K/uL Final   nRBC 10/07/2022 0.0  0.0 - 0.2 % Final   Performed at [facility], [address] [place], [place] [address]   SARS Coronavirus 2 by RT PCR 10/07/2022 NEGATIVE  NEGATIVE Final   Comment: (NOTE) SARS-CoV-2 target nucleic acids are NOT DETECTED.  The SARS-CoV-2 RNA is generally detectable in upper respiratory specimens during the acute phase of infection. The lowest concentration of SARS-CoV-2 viral copies this assay can detect is 138 copies/mL. A negative result does not preclude SARS-Cov-2 infection and should not be used as the sole basis for treatment or other patient management decisions. A negative result may occur with  improper specimen collection/handling, submission of specimen other than nasopharyngeal swab, presence of viral mutation(s) within the areas targeted by this assay, and inadequate number of viral copies(<138 copies/mL). A negative result must be combined with clinical observations, patient history, and epidemiological information. The expected result is Negative.  Fact Sheet for Patients:  BloggerCourse.comhttps://www.fda.gov/media/152166/download  Fact Sheet for Healthcare Providers:  SeriousBroker.ithttps://www.fda.gov/media/152162/download  This test is no                          t yet approved or cleared by the [place] FDA and  has been authorized for detection and/or diagnosis of SARS-CoV-2 by FDA under an Emergency Use Authorization (EUA). This EUA will remain  in effect (meaning this test can be used) for the duration of the COVID-19 declaration under Section 564(b)(1) of the Act, 21 U.S.C.section 360bbb-3(b)(1), unless the authorization is  terminated  or revoked sooner.       Influenza A by PCR 10/07/2022 NEGATIVE  NEGATIVE Final   Influenza B by PCR 10/07/2022 NEGATIVE  NEGATIVE Final   Comment: (NOTE) The Xpert Xpress SARS-CoV-2/FLU/RSV plus assay is intended as an aid in the diagnosis of influenza from Nasopharyngeal swab specimens and should not be used as a sole basis for treatment. Nasal washings and aspirates are unacceptable for  Xpert Xpress SARS-CoV-2/FLU/RSV testing.  Fact Sheet for Patients: BloggerCourse.com  Fact Sheet for Healthcare Providers: SeriousBroker.it  This test is not yet approved or cleared by the [place] FDA and has been authorized for detection and/or diagnosis of SARS-CoV-2 by FDA under an Emergency Use Authorization (EUA). This EUA will remain in effect (meaning this test can be used) for the duration of the COVID-19 declaration under Section 564(b)(1) of the Act, 21 U.S.C. section 360bbb-3(b)(1), unless the authorization is terminated or revoked.     Resp Syncytial Virus by PCR 10/07/2022 NEGATIVE  NEGATIVE Final   Comment: (NOTE) Fact Sheet for Patients: BloggerCourse.com  Fact Sheet for Healthcare Providers: SeriousBroker.it  This test is not yet approved or cleared by the [place] FDA and has been authorized for detection and/or diagnosis of SARS-CoV-2 by FDA under an Emergency Use Authorization (EUA). This EUA will remain in effect (meaning this test can be used) for the duration of the COVID-19 declaration under Section 564(b)(1) of the Act, 21 U.S.C. section 360bbb-3(b)(1), unless the authorization is terminated or revoked.  Performed at [facility], [address], [place], [place] [address]    Specimen Description 10/07/2022 URINE, CLEAN CATCH   Final   Special Requests 10/07/2022    Final                   Value:NONE Performed at [facility], [address] [place], [place] [address]    Culture 10/07/2022 MULTIPLE SPECIES PRESENT, SUGGEST RECOLLECTION (A)   Final   Report Status 10/07/2022 10/08/2022 FINAL   Final  Admission on 07/25/2022, Discharged on 10/07/2022  Component Date Value Ref Range Status   SARS Coronavirus 2 by RT PCR 07/25/2022 NEGATIVE  NEGATIVE Final   Comment: (NOTE) SARS-CoV-2 target nucleic acids are NOT DETECTED.  The SARS-CoV-2 RNA is generally detectable in upper respiratory specimens during the acute phase of infection. The lowest concentration of SARS-CoV-2 viral copies this assay can detect is 138 copies/mL. A negative result does not preclude SARS-Cov-2 infection and should not be used as the sole basis for treatment or other patient management decisions. A negative result may occur with  improper specimen collection/handling, submission of specimen other than nasopharyngeal swab, presence of viral mutation(s) within the areas targeted by this assay, and inadequate number of viral copies(<138 copies/mL). A negative result must be combined with clinical observations, patient history, and epidemiological information. The expected result is Negative.  Fact Sheet for Patients:  BloggerCourse.com  Fact Sheet for Healthcare Providers:  SeriousBroker.it  This test is no                          t yet approved or cleared by the [place] FDA and  has been authorized for detection and/or diagnosis of SARS-CoV-2 by FDA under an Emergency Use Authorization (EUA). This EUA will remain  in effect (meaning this test can be used) for the duration of the COVID-19 declaration under Section 564(b)(1) of the Act, 21 U.S.C.section 360bbb-3(b)(1), unless the authorization is terminated  or revoked sooner.       Influenza A by PCR 07/25/2022 NEGATIVE  NEGATIVE Final   Influenza B by PCR 07/25/2022 NEGATIVE  NEGATIVE Final   Comment: (NOTE) The Xpert  Xpress SARS-CoV-2/FLU/RSV plus assay is intended as an aid in the diagnosis of influenza from Nasopharyngeal swab specimens and should not be used as a sole basis for treatment. Nasal washings and aspirates  are unacceptable for Xpert Xpress SARS-CoV-2/FLU/RSV testing.  Fact Sheet for Patients: BloggerCourse.com  Fact Sheet for Healthcare Providers: SeriousBroker.it  This test is not yet approved or cleared by the [place] FDA and has been authorized for detection and/or diagnosis of SARS-CoV-2 by FDA under an Emergency Use Authorization (EUA). This EUA will remain in effect (meaning this test can be used) for the duration of the COVID-19 declaration under Section 564(b)(1) of the Act, 21 U.S.C. section 360bbb-3(b)(1), unless the authorization is terminated or revoked.  Performed at [facility], [address], [place], [place] [address]    WBC 07/25/2022 8.3  4.0 - 10.5 K/uL Final   RBC 07/25/2022 4.44  3.87 - 5.11 MIL/uL Final   Hemoglobin 07/25/2022 13.7  12.0 - 15.0 g/dL Final   HCT 70/62/3762 40.2  36.0 - 46.0 % Final   MCV 07/25/2022 90.5  80.0 - 100.0 fL Final   MCH 07/25/2022 30.9  26.0 - 34.0 pg Final   MCHC 07/25/2022 34.1  30.0 - 36.0 g/dL Final   RDW 83/15/1761 12.2  11.5 - 15.5 % Final   Platelets 07/25/2022 248  150 - 400 K/uL Final   nRBC 07/25/2022 0.0  0.0 - 0.2 % Final   Neutrophils Relative % 07/25/2022 43  % Final   Neutro Abs 07/25/2022 3.6  1.7 - 7.7 K/uL Final   Lymphocytes Relative 07/25/2022 52  % Final   Lymphs Abs 07/25/2022 4.2 (H)  0.7 - 4.0 K/uL Final   Monocytes Relative 07/25/2022 5  % Final   Monocytes Absolute 07/25/2022 0.4  0.1 - 1.0 K/uL Final   Eosinophils Relative 07/25/2022 0  % Final   Eosinophils Absolute 07/25/2022 0.0  0.0 - 0.5 K/uL Final   Basophils Relative 07/25/2022 0  % Final   Basophils Absolute 07/25/2022 0.0  0.0 - 0.1 K/uL Final   Immature Granulocytes  07/25/2022 0  % Final   Abs Immature Granulocytes 07/25/2022 0.02  0.00 - 0.07 K/uL Final   Performed at [facility], [address], [place], [place] [address]   Sodium 07/25/2022 138  135 - 145 mmol/L Final   Potassium 07/25/2022 4.0  3.5 - 5.1 mmol/L Final   Chloride 07/25/2022 104  98 - 111 mmol/L Final   CO2 07/25/2022 29  22 - 32 mmol/L Final   Glucose, Bld 07/25/2022 83  70 - 99 mg/dL Final   Glucose reference range applies only to samples taken after fasting for at least 8 hours.   BUN 07/25/2022 11  6 - 20 mg/dL Final   Creatinine, Ser 07/25/2022 0.97  0.44 - 1.00 mg/dL Final   Calcium 10/62/6948 9.2  8.9 - 10.3 mg/dL Final   Total Protein 54/62/7035 7.0  6.5 - 8.1 g/dL Final   Albumin 00/93/8182 3.8  3.5 - 5.0 g/dL Final   AST 99/37/1696 18  15 - 41 U/L Final   ALT 07/25/2022 22  0 - 44 U/L Final   Alkaline Phosphatase 07/25/2022 64  38 - 126 U/L Final   Total Bilirubin 07/25/2022 0.2 (L)  0.3 - 1.2 mg/dL Final   GFR, Estimated 07/25/2022 >60  >60 mL/min Final   Comment: (NOTE) Calculated using the CKD-EPI Creatinine Equation (2021)    Anion gap 07/25/2022 5  5 - 15 Final   Performed at [facility], [address] [place], [place] [address]   POC Amphetamine UR 07/25/2022 None Detected  NONE DETECTED (Cut Off Level 1000 ng/mL) Preliminary  POC Secobarbital (BAR) 07/25/2022 None Detected  NONE DETECTED (Cut Off Level 300 ng/mL) Preliminary   POC Buprenorphine (BUP) 07/25/2022 None Detected  NONE DETECTED (Cut Off Level 10 ng/mL) Preliminary   POC Oxazepam (BZO) 07/25/2022 None Detected  NONE DETECTED (Cut Off Level 300 ng/mL) Preliminary   POC Cocaine UR 07/25/2022 None Detected  NONE DETECTED (Cut Off Level 300 ng/mL) Preliminary   POC Methamphetamine UR 07/25/2022 None Detected  NONE DETECTED (Cut Off Level 1000 ng/mL) Preliminary   POC Morphine 07/25/2022 None Detected  NONE DETECTED (Cut Off Level 300 ng/mL) Preliminary   POC Methadone UR 07/25/2022 None  Detected  NONE DETECTED (Cut Off Level 300 ng/mL) Preliminary   POC Oxycodone UR 07/25/2022 None Detected  NONE DETECTED (Cut Off Level 100 ng/mL) Preliminary   POC Marijuana UR 07/25/2022 None Detected  NONE DETECTED (Cut Off Level 50 ng/mL) Preliminary   SARSCOV2ONAVIRUS 2 AG 07/25/2022 NEGATIVE  NEGATIVE Final   Comment: (NOTE) SARS-CoV-2 antigen NOT DETECTED.   Negative results are presumptive.  Negative results do not preclude SARS-CoV-2 infection and should not be used as the sole basis for treatment or other patient management decisions, including infection  control decisions, particularly in the presence of clinical signs and  symptoms consistent with COVID-19, or in those who have been in contact with the virus.  Negative results must be combined with clinical observations, patient history, and epidemiological information. The expected result is Negative.  Fact Sheet for Patients: https://www.jennings-kim.com/  Fact Sheet for Healthcare Providers: https://alexander-rogers.biz/  This test is not yet approved or cleared by the [place] FDA and  has been authorized for detection and/or diagnosis of SARS-CoV-2 by FDA under an Emergency Use Authorization (EUA).  This EUA will remain in effect (meaning this test can be used) for the duration of  the COV                          ID-19 declaration under Section 564(b)(1) of the Act, 21 U.S.C. section 360bbb-3(b)(1), unless the authorization is terminated or revoked sooner.     Cholesterol 07/25/2022 181  0 - 200 mg/dL Final   Triglycerides 16/07/9603 118  <150 mg/dL Final   HDL 54/06/8118 45  >40 mg/dL Final   Total CHOL/HDL Ratio 07/25/2022 4.0  RATIO Final   VLDL 07/25/2022 24  0 - 40 mg/dL Final   LDL Cholesterol 07/25/2022 112 (H)  0 - 99 mg/dL Final   Comment:        Total Cholesterol/HDL:CHD Risk Coronary Heart Disease Risk Table                     Men   Women  1/2 Average Risk   3.4    3.3  Average Risk       5.0   4.4  2 X Average Risk   9.6   7.1  3 X Average Risk  23.4   11.0        Use the calculated Patient Ratio above and the CHD Risk Table to determine the patient's CHD Risk.        ATP III CLASSIFICATION (LDL):  <100     mg/dL   Optimal  147-829  mg/dL   Near or Above                    Optimal  130-159  mg/dL   Borderline  562-130  mg/dL   High  >865  mg/dL   Very High Performed at [facility], [address], [place], [place] [address]    TSH 07/25/2022 6.668 (H)  0.350 - 4.500 uIU/mL Final   Comment: Performed by a 3rd Generation assay with a functional sensitivity of <=0.01 uIU/mL. Performed at [facility], [address] [place], [place] [address]    Glucose-Capillary 07/26/2022 104 (H)  70 - 99 mg/dL Final   Glucose reference range applies only to samples taken after fasting for at least 8 hours.   T3, Free 07/31/2022 2.3  2.0 - 4.4 pg/mL Final   Comment: (NOTE) Performed At: [facility] [address] [place], [place] [id number] Jolene Schimke MD [id number]    Free T4 07/31/2022 0.60 (L)  0.61 - 1.12 ng/dL Final   Comment: (NOTE) Biotin ingestion may interfere with free T4 tests. If the results are inconsistent with the TSH level, previous test results, or the clinical presentation, then consider biotin interference. If needed, order repeat testing after stopping biotin. Performed at [facility], [address], [place], [place] [address]    Glucose-Capillary 08/29/2022 100 (H)  70 - 99 mg/dL Final   Glucose reference range applies only to samples taken after fasting for at least 8 hours.   TSH 09/05/2022 0.793  0.350 - 4.500 uIU/mL Final   Comment: Performed by a 3rd Generation assay with a functional sensitivity of <=0.01 uIU/mL. Performed at [facility], [address], [place], [place] [address]    Free T4 09/05/2022 0.73  0.61 - 1.12 ng/dL Final   Comment: (NOTE) Biotin ingestion may  interfere with free T4 tests. If the results are inconsistent with the TSH level, previous test results, or the clinical presentation, then consider biotin interference. If needed, order repeat testing after stopping biotin. Performed at [facility], [address] [place], [place] [address]    Preg Test, Ur 09/06/2022 Negative  Negative Final   Preg Test, Ur 09/06/2022 NEGATIVE  NEGATIVE Final   Comment:        THE SENSITIVITY OF THIS METHODOLOGY IS >24 mIU/mL    Preg Test, Ur 09/05/2022 NEGATIVE  NEGATIVE Final   Comment:        THE SENSITIVITY OF THIS METHODOLOGY IS >24 mIU/mL    Sodium 09/13/2022 136  135 - 145 mmol/L Final   Potassium 09/13/2022 4.5  3.5 - 5.1 mmol/L Final   Chloride 09/13/2022 105  98 - 111 mmol/L Final   CO2 09/13/2022 21 (L)  22 - 32 mmol/L Final   Glucose, Bld 09/13/2022 111 (H)  70 - 99 mg/dL Final   Glucose reference range applies only to samples taken after fasting for at least 8 hours.   BUN 09/13/2022 14  6 - 20 mg/dL Final   Creatinine, Ser 09/13/2022 0.92  0.44 - 1.00 mg/dL Final   Calcium 44/10/270 9.1  8.9 - 10.3 mg/dL Final   GFR, Estimated 09/13/2022 >60  >60 mL/min Final   Comment: (NOTE) Calculated using the CKD-EPI Creatinine Equation (2021)    Anion gap 09/13/2022 10  5 - 15 Final   Performed at [facility], [address], [place], [place] [address]   Vitamin B-12 09/13/2022 585  180 - 914 pg/mL Final   Comment: (NOTE) This assay is not validated for testing neonatal or myeloproliferative syndrome specimens for Vitamin B12 levels. Performed at [facility], [address], [place], [place] [address]    Color,  Urine 09/14/2022 YELLOW  YELLOW Final   APPearance 09/14/2022 HAZY (A)  CLEAR Final   Specific Gravity, Urine 09/14/2022 1.025  1.005 - 1.030 Final   pH 09/14/2022 5.0  5.0 - 8.0 Final   Glucose, UA 09/14/2022 NEGATIVE  NEGATIVE mg/dL Final   Hgb urine dipstick 09/14/2022 NEGATIVE  NEGATIVE Final    Bilirubin Urine 09/14/2022 NEGATIVE  NEGATIVE Final   Ketones, ur 09/14/2022 NEGATIVE  NEGATIVE mg/dL Final   Protein, ur 40/98/1191 NEGATIVE  NEGATIVE mg/dL Final   Nitrite 47/82/9562 NEGATIVE  NEGATIVE Final   Leukocytes,Ua 09/14/2022 TRACE (A)  NEGATIVE Final   RBC / HPF 09/14/2022 0-5  0 - 5 RBC/hpf Final   WBC, UA 09/14/2022 0-5  0 - 5 WBC/hpf Final   Bacteria, UA 09/14/2022 RARE (A)  NONE SEEN Final   Squamous Epithelial / HPF 09/14/2022 0-5  0 - 5 Final   Mucus 09/14/2022 PRESENT   Final   Performed at [facility], [address] [place], [place] [address]   Glucose-Capillary 09/16/2022 105 (H)  70 - 99 mg/dL Final   Glucose reference range applies only to samples taken after fasting for at least 8 hours.   SARS Coronavirus 2 by RT PCR 09/30/2022 NEGATIVE  NEGATIVE Final   Comment: (NOTE) SARS-CoV-2 target nucleic acids are NOT DETECTED.  The SARS-CoV-2 RNA is generally detectable in upper respiratory specimens during the acute phase of infection. The lowest concentration of SARS-CoV-2 viral copies this assay can detect is 138 copies/mL. A negative result does not preclude SARS-Cov-2 infection and should not be used as the sole basis for treatment or other patient management decisions. A negative result may occur with  improper specimen collection/handling, submission of specimen other than nasopharyngeal swab, presence of viral mutation(s) within the areas targeted by this assay, and inadequate number of viral copies(<138 copies/mL). A negative result must be combined with clinical observations, patient history, and epidemiological information. The expected result is Negative.  Fact Sheet for Patients:  BloggerCourse.com  Fact Sheet for Healthcare Providers:  SeriousBroker.it  This test is no                          t yet approved or cleared by the [place] FDA and  has been authorized for detection and/or  diagnosis of SARS-CoV-2 by FDA under an Emergency Use Authorization (EUA). This EUA will remain  in effect (meaning this test can be used) for the duration of the COVID-19 declaration under Section 564(b)(1) of the Act, 21 U.S.C.section 360bbb-3(b)(1), unless the authorization is terminated  or revoked sooner.       Influenza A by PCR 09/30/2022 NEGATIVE  NEGATIVE Final   Influenza B by PCR 09/30/2022 NEGATIVE  NEGATIVE Final   Comment: (NOTE) The Xpert Xpress SARS-CoV-2/FLU/RSV plus assay is intended as an aid in the diagnosis of influenza from Nasopharyngeal swab specimens and should not be used as a sole basis for treatment. Nasal washings and aspirates are unacceptable for Xpert Xpress SARS-CoV-2/FLU/RSV testing.  Fact Sheet for Patients: BloggerCourse.com  Fact Sheet for Healthcare Providers: SeriousBroker.it  This test is not yet approved or cleared by the [place] FDA and has been authorized for detection and/or diagnosis of SARS-CoV-2 by FDA under an Emergency Use Authorization (EUA). This EUA will remain in effect (meaning this test can be used) for the duration of the COVID-19 declaration under Section 564(b)(1) of the Act, 21 U.S.C. section 360bbb-3(b)(1), unless the authorization  is terminated or revoked.     Resp Syncytial Virus by PCR 09/30/2022 NEGATIVE  NEGATIVE Final   Comment: (NOTE) Fact Sheet for Patients: BloggerCourse.com  Fact Sheet for Healthcare Providers: SeriousBroker.it  This test is not yet approved or cleared by the [place] FDA and has been authorized for detection and/or diagnosis of SARS-CoV-2 by FDA under an Emergency Use Authorization (EUA). This EUA will remain in effect (meaning this test can be used) for the duration of the COVID-19 declaration under Section 564(b)(1) of the Act, 21 U.S.C. section 360bbb-3(b)(1), unless the  authorization is terminated or revoked.  Performed at [facility], [address], [place], [place] [address]    Sodium 10/01/2022 136  135 - 145 mmol/L Final   Potassium 10/01/2022 3.8  3.5 - 5.1 mmol/L Final   Chloride 10/01/2022 104  98 - 111 mmol/L Final   CO2 10/01/2022 27  22 - 32 mmol/L Final   Glucose, Bld 10/01/2022 98  70 - 99 mg/dL Final   Glucose reference range applies only to samples taken after fasting for at least 8 hours.   BUN 10/01/2022 12  6 - 20 mg/dL Final   Creatinine, Ser 10/01/2022 0.98  0.44 - 1.00 mg/dL Final   Calcium 60/45/4098 8.9  8.9 - 10.3 mg/dL Final   GFR, Estimated 10/01/2022 >60  >60 mL/min Final   Comment: (NOTE) Calculated using the CKD-EPI Creatinine Equation (2021)    Anion gap 10/01/2022 5  5 - 15 Final   Performed at [facility], [address], [place], [place] [address]   WBC 10/01/2022 5.1  4.0 - 10.5 K/uL Final   RBC 10/01/2022 4.31  3.87 - 5.11 MIL/uL Final   Hemoglobin 10/01/2022 12.7  12.0 - 15.0 g/dL Final   HCT 78/29/5621 38.8  36.0 - 46.0 % Final   MCV 10/01/2022 90.0  80.0 - 100.0 fL Final   MCH 10/01/2022 29.5  26.0 - 34.0 pg Final   MCHC 10/01/2022 32.7  30.0 - 36.0 g/dL Final   RDW 30/86/5784 12.4  11.5 - 15.5 % Final   Platelets 10/01/2022 300  150 - 400 K/uL Final   nRBC 10/01/2022 0.0  0.0 - 0.2 % Final   Performed at [facility], [address] [place], [place] [address]   Lipase 10/01/2022 29  11 - 51 U/L Final   Performed at [facility], [address], [place], [place] [address]   Color, Urine 10/05/2022 YELLOW  YELLOW Final   APPearance 10/05/2022 HAZY (A)  CLEAR Final   Specific Gravity, Urine 10/05/2022 1.018  1.005 - 1.030 Final   pH 10/05/2022 5.0  5.0 - 8.0 Final   Glucose, UA 10/05/2022 NEGATIVE  NEGATIVE mg/dL Final   Hgb urine dipstick 10/05/2022 NEGATIVE  NEGATIVE Final   Bilirubin Urine 10/05/2022 NEGATIVE  NEGATIVE Final   Ketones, ur 10/05/2022 NEGATIVE  NEGATIVE mg/dL  Final   Protein, ur 32/44/0102 NEGATIVE  NEGATIVE mg/dL Final   Nitrite 72/53/6644 NEGATIVE  NEGATIVE Final   Leukocytes,Ua 10/05/2022 TRACE (A)  NEGATIVE Final   RBC / HPF 10/05/2022 0-5  0 - 5 RBC/hpf Final   WBC, UA 10/05/2022 0-5  0 - 5 WBC/hpf Final   Bacteria, UA 10/05/2022 RARE (A)  NONE SEEN Final   Squamous Epithelial / HPF 10/05/2022 0-5  0 - 5 Final   Mucus 10/05/2022 PRESENT   Final   Performed at [facility], [address],  [place], [place] [address]   Specimen Description 10/05/2022 URINE, CLEAN CATCH   Final   Special Requests 10/05/2022    Final                   Value:NONE Performed at [facility], [address], [place], [place] [address]    Culture 10/05/2022 10,000 COLONIES/mL MULTIPLE SPECIES PRESENT, SUGGEST RECOLLECTION (A)   Final   Report Status 10/05/2022 10/07/2022 FINAL   Final  Admission on 07/04/2022, Discharged on 07/05/2022  Component Date Value Ref Range Status   Sodium 07/04/2022 142  135 - 145 mmol/L Final   Potassium 07/04/2022 4.4  3.5 - 5.1 mmol/L Final   Chloride 07/04/2022 109  98 - 111 mmol/L Final   CO2 07/04/2022 26  22 - 32 mmol/L Final   Glucose, Bld 07/04/2022 96  70 - 99 mg/dL Final   Glucose reference range applies only to samples taken after fasting for at least 8 hours.   BUN 07/04/2022 15  6 - 20 mg/dL Final   Creatinine, Ser 07/04/2022 0.83  0.44 - 1.00 mg/dL Final   Calcium 09/81/1914 9.3  8.9 - 10.3 mg/dL Final   Total Protein 78/29/5621 7.2  6.5 - 8.1 g/dL Final   Albumin 30/86/5784 3.9  3.5 - 5.0 g/dL Final   AST 69/62/9528 23  15 - 41 U/L Final   ALT 07/04/2022 28  0 - 44 U/L Final   Alkaline Phosphatase 07/04/2022 77  38 - 126 U/L Final   Total Bilirubin 07/04/2022 0.4  0.3 - 1.2 mg/dL Final   GFR, Estimated 07/04/2022 >60  >60 mL/min Final   Comment: (NOTE) Calculated using the CKD-EPI Creatinine Equation (2021)    Anion gap 07/04/2022 7  5 - 15 Final   Performed at [facility], [address] [place], [place] [address]   Alcohol, Ethyl (B) 07/04/2022 <10  <10 mg/dL Final   Comment: (NOTE) Lowest detectable limit for serum alcohol is 10 mg/dL.  For medical purposes only. Performed at [facility], [address] [place], [place] [address]    WBC 07/04/2022 7.0  4.0 - 10.5 K/uL Final   RBC 07/04/2022 4.18  3.87 - 5.11 MIL/uL Final   Hemoglobin 07/04/2022 12.8  12.0 - 15.0 g/dL Final   HCT 72/53/6644 39.1  36.0 - 46.0 % Final   MCV 07/04/2022 93.5  80.0 - 100.0 fL Final   MCH 07/04/2022 30.6  26.0 - 34.0 pg Final   MCHC 07/04/2022 32.7  30.0 - 36.0 g/dL Final   RDW 03/47/4259 12.9  11.5 - 15.5 % Final   Platelets 07/04/2022 243  150 - 400 K/uL Final   nRBC 07/04/2022 0.0  0.0 - 0.2 % Final   Neutrophils Relative % 07/04/2022 43  % Final   Neutro Abs 07/04/2022 3.0  1.7 - 7.7 K/uL Final   Lymphocytes Relative 07/04/2022 50  % Final   Lymphs Abs 07/04/2022 3.5  0.7 - 4.0 K/uL Final   Monocytes Relative 07/04/2022 7  % Final   Monocytes Absolute 07/04/2022 0.5  0.1 - 1.0 K/uL Final   Eosinophils Relative 07/04/2022 0  % Final   Eosinophils Absolute 07/04/2022 0.0  0.0 - 0.5 K/uL Final   Basophils Relative 07/04/2022 0  % Final   Basophils Absolute 07/04/2022 0.0  0.0 - 0.1 K/uL Final   Immature Granulocytes 07/04/2022 0  % Final   Abs Immature Granulocytes 07/04/2022 0.01  0.00 - 0.07 K/uL  Final   Performed at [facility], [address] [place], [place] [address]   I-stat hCG, quantitative 07/04/2022 <5.0  <5 mIU/mL Final   Comment 3 07/04/2022          Final   Comment:   GEST. AGE      CONC.  (mIU/mL)   <=1 WEEK        5 - 50     2 WEEKS       50 - 500     3 WEEKS       100 - 10,000     4 WEEKS     1,000 - 30,000        FEMALE AND NON-PREGNANT FEMALE:     LESS THAN 5 mIU/mL   Admission on 06/14/2022, Discharged on 06/14/2022  Component Date Value Ref Range Status   Color, UA 06/14/2022 yellow  yellow Final    Clarity, UA 06/14/2022 cloudy (A)  clear Final   Glucose, UA 06/14/2022 negative  negative mg/dL Final   Bilirubin, UA 48/25/0037 negative  negative Final   Ketones, POC UA 06/14/2022 negative  negative mg/dL Final   Spec Grav, UA 04/88/8916 1.025  1.010 - 1.025 Final   Blood, UA 06/14/2022 negative  negative Final   pH, UA 06/14/2022 7.5  5.0 - 8.0 Final   Protein Ur, POC 06/14/2022 =30 (A)  negative mg/dL Final   Urobilinogen, UA 06/14/2022 0.2  0.2 or 1.0 E.U./dL Final   Nitrite, UA 94/50/3888 Negative  Negative Final   Leukocytes, UA 06/14/2022 Negative  Negative Final   Preg Test, Ur 06/14/2022 Negative  Negative Final   Specimen Description 06/14/2022 URINE, CLEAN CATCH   Final   Special Requests 06/14/2022    Final                   Value:NONE Performed at [facility], [address], [place], [place] [address]    Culture 06/14/2022 MULTIPLE SPECIES PRESENT, SUGGEST RECOLLECTION (A)   Final   Report Status 06/14/2022 06/16/2022 FINAL   Final  Admission on 06/07/2022, Discharged on 06/10/2022  Component Date Value Ref Range Status   SARS Coronavirus 2 by RT PCR 06/07/2022 NEGATIVE  NEGATIVE Final   Comment: (NOTE) SARS-CoV-2 target nucleic acids are NOT DETECTED.  The SARS-CoV-2 RNA is generally detectable in upper respiratory specimens during the acute phase of infection. The lowest concentration of SARS-CoV-2 viral copies this assay can detect is 138 copies/mL. A negative result does not preclude SARS-Cov-2 infection and should not be used as the sole basis for treatment or other patient management decisions. A negative result may occur with  improper specimen collection/handling, submission of specimen other than nasopharyngeal swab, presence of viral mutation(s) within the areas targeted by this assay, and inadequate number of viral copies(<138 copies/mL). A negative result must be combined with clinical observations, patient history, and  epidemiological information. The expected result is Negative.  Fact Sheet for Patients:  BloggerCourse.com  Fact Sheet for Healthcare Providers:  SeriousBroker.it  This test is no                          t yet approved or cleared by the [place] FDA and  has been authorized for detection and/or diagnosis of SARS-CoV-2 by FDA under an Emergency Use Authorization (EUA). This EUA will remain  in effect (meaning this test can be used) for the duration of the COVID-19 declaration  under Section 564(b)(1) of the Act, 21 U.S.C.section 360bbb-3(b)(1), unless the authorization is terminated  or revoked sooner.       Influenza A by PCR 06/07/2022 NEGATIVE  NEGATIVE Final   Influenza B by PCR 06/07/2022 NEGATIVE  NEGATIVE Final   Comment: (NOTE) The Xpert Xpress SARS-CoV-2/FLU/RSV plus assay is intended as an aid in the diagnosis of influenza from Nasopharyngeal swab specimens and should not be used as a sole basis for treatment. Nasal washings and aspirates are unacceptable for Xpert Xpress SARS-CoV-2/FLU/RSV testing.  Fact Sheet for Patients: BloggerCourse.com  Fact Sheet for Healthcare Providers: SeriousBroker.it  This test is not yet approved or cleared by the [place] FDA and has been authorized for detection and/or diagnosis of SARS-CoV-2 by FDA under an Emergency Use Authorization (EUA). This EUA will remain in effect (meaning this test can be used) for the duration of the COVID-19 declaration under Section 564(b)(1) of the Act, 21 U.S.C. section 360bbb-3(b)(1), unless the authorization is terminated or revoked.  Performed at [facility], [address] [place], [place] [address]    WBC 06/07/2022 5.7  4.0 - 10.5 K/uL Final   RBC 06/07/2022 4.39  3.87 - 5.11 MIL/uL Final   Hemoglobin 06/07/2022 13.2  12.0 - 15.0 g/dL Final   HCT 16/60/6301 40.4  36.0 - 46.0  % Final   MCV 06/07/2022 92.0  80.0 - 100.0 fL Final   MCH 06/07/2022 30.1  26.0 - 34.0 pg Final   MCHC 06/07/2022 32.7  30.0 - 36.0 g/dL Final   RDW 60/07/9322 12.4  11.5 - 15.5 % Final   Platelets 06/07/2022 308  150 - 400 K/uL Final   nRBC 06/07/2022 0.0  0.0 - 0.2 % Final   Neutrophils Relative % 06/07/2022 42  % Final   Neutro Abs 06/07/2022 2.4  1.7 - 7.7 K/uL Final   Lymphocytes Relative 06/07/2022 54  % Final   Lymphs Abs 06/07/2022 3.1  0.7 - 4.0 K/uL Final   Monocytes Relative 06/07/2022 4  % Final   Monocytes Absolute 06/07/2022 0.2  0.1 - 1.0 K/uL Final   Eosinophils Relative 06/07/2022 0  % Final   Eosinophils Absolute 06/07/2022 0.0  0.0 - 0.5 K/uL Final   Basophils Relative 06/07/2022 0  % Final   Basophils Absolute 06/07/2022 0.0  0.0 - 0.1 K/uL Final   Immature Granulocytes 06/07/2022 0  % Final   Abs Immature Granulocytes 06/07/2022 0.01  0.00 - 0.07 K/uL Final   Performed at [facility], [address] [place], [place] [address]   Sodium 06/07/2022 139  135 - 145 mmol/L Final   Potassium 06/07/2022 4.0  3.5 - 5.1 mmol/L Final   Chloride 06/07/2022 104  98 - 111 mmol/L Final   CO2 06/07/2022 28  22 - 32 mmol/L Final   Glucose, Bld 06/07/2022 104 (H)  70 - 99 mg/dL Final   Glucose reference range applies only to samples taken after fasting for at least 8 hours.   BUN 06/07/2022 8  6 - 20 mg/dL Final   Creatinine, Ser 06/07/2022 0.84  0.44 - 1.00 mg/dL Final   Calcium 20/25/4270 9.1  8.9 - 10.3 mg/dL Final   Total Protein 62/37/6283 6.9  6.5 - 8.1 g/dL Final   Albumin 15/17/6160 3.7  3.5 - 5.0 g/dL Final   AST 73/71/0626 19  15 - 41 U/L Final   ALT 06/07/2022 22  0 - 44 U/L Final   Alkaline Phosphatase 06/07/2022 54  38 - 126 U/L Final   Total Bilirubin 06/07/2022 0.4  0.3 - 1.2 mg/dL Final   GFR, Estimated 06/07/2022 >60  >60 mL/min Final   Comment: (NOTE) Calculated using the CKD-EPI Creatinine Equation (2021)    Anion gap 06/07/2022 7  5 - 15 Final    Performed at [facility], [address] [place], [place] [address]   Hgb A1c MFr Bld 06/07/2022 5.0  4.8 - 5.6 % Final   Comment: (NOTE) Pre diabetes:          5.7%-6.4%  Diabetes:              >6.4%  Glycemic control for   <7.0% adults with diabetes    Mean Plasma Glucose 06/07/2022 96.8  mg/dL Final   Performed at [facility], [address], [place], [place] [address]   TSH 06/07/2022 1.620  0.350 - 4.500 uIU/mL Final   Comment: Performed by a 3rd Generation assay with a functional sensitivity of <=0.01 uIU/mL. Performed at [facility], [address] [place], [place] [address]    RPR Ser Ql 06/07/2022 NON REACTIVE  NON REACTIVE Final   Performed at [facility], [address] [place], [place] [address]   Color, Urine 06/07/2022 YELLOW  YELLOW Final   APPearance 06/07/2022 HAZY (A)  CLEAR Final   Specific Gravity, Urine 06/07/2022 1.018  1.005 - 1.030 Final   pH 06/07/2022 7.0  5.0 - 8.0 Final   Glucose, UA 06/07/2022 NEGATIVE  NEGATIVE mg/dL Final   Hgb urine dipstick 06/07/2022 NEGATIVE  NEGATIVE Final   Bilirubin Urine 06/07/2022 NEGATIVE  NEGATIVE Final   Ketones, ur 06/07/2022 NEGATIVE  NEGATIVE mg/dL Final   Protein, ur 29/56/2130 NEGATIVE  NEGATIVE mg/dL Final   Nitrite 86/57/8469 NEGATIVE  NEGATIVE Final   Leukocytes,Ua 06/07/2022 NEGATIVE  NEGATIVE Final   Performed at [facility], [address] [place], [place] [address]   Cholesterol 06/07/2022 164  0 - 200 mg/dL Final   Triglycerides 84/13/2440 158 (H)  <150 mg/dL Final   HDL 08/10/2535 44  >40 mg/dL Final   Total CHOL/HDL Ratio 06/07/2022 3.7  RATIO Final   VLDL 06/07/2022 32  0 - 40 mg/dL Final   LDL Cholesterol 06/07/2022 88  0 - 99 mg/dL Final   Comment:        Total Cholesterol/HDL:CHD Risk Coronary Heart Disease Risk Table                     Men   Women  1/2 Average Risk   3.4   3.3  Average Risk       5.0   4.4  2 X Average Risk   9.6   7.1  3 X Average Risk   23.4   11.0        Use the calculated Patient Ratio above and the CHD Risk Table to determine the patient's CHD Risk.        ATP III CLASSIFICATION (LDL):  <100     mg/dL   Optimal  644-034  mg/dL   Near or Above                    Optimal  130-159  mg/dL   Borderline  742-595  mg/dL   High  >638     mg/dL   Very High Performed at [facility], [address], [place], [place] [address]  HIV Screen 4th Generation wRfx 06/07/2022 Non Reactive  Non Reactive Final   Performed at [facility], [address] [place], [place] [address]   SARSCOV2ONAVIRUS 2 AG 06/07/2022 NEGATIVE  NEGATIVE Final   Comment: (NOTE) SARS-CoV-2 antigen NOT DETECTED.   Negative results are presumptive.  Negative results do not preclude SARS-CoV-2 infection and should not be used as the sole basis for treatment or other patient management decisions, including infection  control decisions, particularly in the presence of clinical signs and  symptoms consistent with COVID-19, or in those who have been in contact with the virus.  Negative results must be combined with clinical observations, patient history, and epidemiological information. The expected result is Negative.  Fact Sheet for Patients: https://www.jennings-kim.com/https://www.fda.gov/media/141569/download  Fact Sheet for Healthcare Providers: https://alexander-rogers.biz/https://www.fda.gov/media/141568/download  This test is not yet approved or cleared by the [place] FDA and  has been authorized for detection and/or diagnosis of SARS-CoV-2 by FDA under an Emergency Use Authorization (EUA).  This EUA will remain in effect (meaning this test can be used) for the duration of  the COV                          ID-19 declaration under Section 564(b)(1) of the Act, 21 U.S.C. section 360bbb-3(b)(1), unless the authorization is terminated or revoked sooner.     POC Amphetamine UR 06/07/2022 None Detected  NONE DETECTED (Cut Off Level 1000 ng/mL) Final   POC Secobarbital (BAR)  06/07/2022 None Detected  NONE DETECTED (Cut Off Level 300 ng/mL) Final   POC Buprenorphine (BUP) 06/07/2022 None Detected  NONE DETECTED (Cut Off Level 10 ng/mL) Final   POC Oxazepam (BZO) 06/07/2022 None Detected  NONE DETECTED (Cut Off Level 300 ng/mL) Final   POC Cocaine UR 06/07/2022 None Detected  NONE DETECTED (Cut Off Level 300 ng/mL) Final   POC Methamphetamine UR 06/07/2022 None Detected  NONE DETECTED (Cut Off Level 1000 ng/mL) Final   POC Morphine 06/07/2022 None Detected  NONE DETECTED (Cut Off Level 300 ng/mL) Final   POC Methadone UR 06/07/2022 None Detected  NONE DETECTED (Cut Off Level 300 ng/mL) Final   POC Oxycodone UR 06/07/2022 None Detected  NONE DETECTED (Cut Off Level 100 ng/mL) Final   POC Marijuana UR 06/07/2022 None Detected  NONE DETECTED (Cut Off Level 50 ng/mL) Final   Preg Test, Ur 06/08/2022 NEGATIVE  NEGATIVE Final   Comment:        THE SENSITIVITY OF THIS METHODOLOGY IS >20 mIU/mL. Performed at [facility], [address] [place], [place] [address]    Preg Test, Ur 06/08/2022 NEGATIVE  NEGATIVE Final   Comment:        THE SENSITIVITY OF THIS METHODOLOGY IS >24 mIU/mL     Blood Alcohol level:  Lab Results  Component Value Date   ETH <10 07/04/2022   ETH <10 02/07/2022    Metabolic Disorder Labs: Lab Results  Component Value Date   HGBA1C 5.7 (H) 10/15/2022   MPG 117 10/15/2022   MPG 96.8 06/07/2022   No results found for: "PROLACTIN" Lab Results  Component Value Date   CHOL 181 07/25/2022   TRIG 118 07/25/2022   HDL 45 07/25/2022   CHOLHDL 4.0 07/25/2022   VLDL 24 07/25/2022   LDLCALC 112 (H) 07/25/2022   LDLCALC 88 06/07/2022    Therapeutic Lab Levels: No results found for: "LITHIUM" No results found for: "VALPROATE" No results found for: "CBMZ"  Physical  Findings   GAD-7    Flowsheet Row [facility]  Total GAD-7 Score 17      PHQ2-9    Flowsheet Row [facility]  [facility]  PHQ-2 Total Score 0 1 2  PHQ-9 Total Score -- 2 8      Flowsheet Row [facility] Most recent reading at 10/27/2022  9:31 AM ED [facility]/07[facility]20[facility] Most recent reading at 10/07/2022 12:18 PM ED [facility] 07[facility]25/2022 [facility] Most recent reading at 10/06/2022  9:38 AM  C-SSRS RISK CATEGORY No Risk No Risk No Risk        Musculoskeletal  Strength & Muscle Tone: within normal limits Gait & Station: normal Patient leans: N/A  Psychiatric Specialty Exam  Presentation  General Appearance:  Appropriate for Environment; Casual  Eye Contact: Good  Speech: Clear and Coherent; Normal Rate  Speech Volume: Normal  Handedness: Right   Mood and Affect  Mood: Euthymic  Affect: Appropriate; Congruent   Thought Process  Thought Processes: Coherent; Goal Directed  Descriptions of Associations:Intact  Orientation:Full (Time, Place and Person)  Thought Content:Logical; WDL  Diagnosis of Schizophrenia or Schizoaffective disorder in past: Yes    Hallucinations:Hallucinations: None  Ideas of Reference:None  Suicidal Thoughts:Suicidal Thoughts: No  Homicidal Thoughts:Homicidal Thoughts: No   Sensorium  Memory: Immediate Fair; Recent Fair  Judgment: Intact  Insight: Present   Executive Functions  Concentration: Fair  Attention Span: Fair  Recall: [employer]Fair  Fund of Knowledge: Fair  Language: Fair   Psychomotor Activity  Psychomotor Activity: Psychomotor Activity: Normal   Assets  Assets: Communication Skills; Desire for Improvement; Physical Health; Resilience; Social Support   Sleep  Sleep: Sleep: Good   Physical Exam   Physical Exam Vitals and nursing note reviewed.  Constitutional:      General: She is not in acute distress.    Appearance: She is not ill-appearing, toxic-appearing or diaphoretic.  HENT:     Head: Normocephalic and atraumatic.  Pulmonary:     Effort: Pulmonary effort is normal. No respiratory distress.  Neurological:     General: No focal deficit present.     Mental Status: She is alert.    Review of Systems  Constitutional:  Positive for malaise/fatigue.  Respiratory:  Negative for shortness of breath.   Cardiovascular:  Negative for chest pain.  Gastrointestinal:  Positive for abdominal pain. Negative for nausea and vomiting.  Neurological:  Negative for dizziness and headaches.   Blood pressure 114/82, pulse 95, temperature 98.2 F (36.8 C), resp. rate 20, height 5\' 1"  (1.549 m), weight 198 lb (89.8 kg), SpO2 99 %. Body mass index is 37.41 kg/m.  Treatment Plan Summary: Patient reviewed with Dr. Nelly RoutArchana Kumar.  Patient remains cleared by psychiatry.  TOC team continues to seek disposition.  Daily contact with patient to assess and evaluate symptoms and progress in treatment  Princess BruinsJulie Freida Nebel, DO 11/01/2022 8:37 AM

## 2022-11-01 NOTE — ED Notes (Signed)
Pt asleep at this hour. No apparent distress. RR even and unlabored. Monitored for safety.

## 2022-11-01 NOTE — ED Notes (Signed)
Pt asked for Covid test. Said she was worried because she had contact with an individual on the unit earlier this week that turned out to have Covid. POC Covid completed and pt result was Negative. Will continue to monitor.

## 2022-11-01 NOTE — ED Notes (Addendum)
Pt calm at this hour. No apparent distress. RR even and unlabored. Pt spoke with her father today and is feeling sad. Says she loves her father but knows also that he triggers her with the things he says. "He knows I'll do anything for him. I love my dad. People tell me not to trust him and that he will just put me in a home and take my daughter. He said I'm worrying about the wrong things." Pt is committed to continue taking her medication and getting well. Emotional support given. Monitored for safety.

## 2022-11-02 DIAGNOSIS — Z1152 Encounter for screening for COVID-19: Secondary | ICD-10-CM | POA: Diagnosis not present

## 2022-11-02 DIAGNOSIS — E039 Hypothyroidism, unspecified: Secondary | ICD-10-CM | POA: Diagnosis not present

## 2022-11-02 DIAGNOSIS — E118 Type 2 diabetes mellitus with unspecified complications: Secondary | ICD-10-CM | POA: Diagnosis not present

## 2022-11-02 DIAGNOSIS — N9489 Other specified conditions associated with female genital organs and menstrual cycle: Secondary | ICD-10-CM | POA: Diagnosis not present

## 2022-11-02 DIAGNOSIS — F333 Major depressive disorder, recurrent, severe with psychotic symptoms: Secondary | ICD-10-CM | POA: Diagnosis not present

## 2022-11-02 DIAGNOSIS — F419 Anxiety disorder, unspecified: Secondary | ICD-10-CM | POA: Diagnosis not present

## 2022-11-02 DIAGNOSIS — F209 Schizophrenia, unspecified: Secondary | ICD-10-CM | POA: Diagnosis not present

## 2022-11-02 DIAGNOSIS — N390 Urinary tract infection, site not specified: Secondary | ICD-10-CM | POA: Diagnosis not present

## 2022-11-02 DIAGNOSIS — F17200 Nicotine dependence, unspecified, uncomplicated: Secondary | ICD-10-CM | POA: Diagnosis not present

## 2022-11-02 NOTE — ED Notes (Signed)
Pt asleep at this hour. No apparent distress. RR even and unlabored. Monitored for safety.

## 2022-11-02 NOTE — ED Notes (Signed)
Patient  sleeping in no acute stress. RR even and unlabored .Environment secured .Will continue to monitor for safely. 

## 2022-11-02 NOTE — ED Notes (Signed)
Patient alert and oriented x 3. Denies SI/HI/AVH. Denies intent or plan to harm self or others. Routine conducted according to faculty protocol. Encourage patient to notify staff with any needs or concerns. Patient verbalized agreement and understanding. Will continue to monitor for safety. 

## 2022-11-02 NOTE — ED Notes (Signed)
Patient is on the telephone . Showing no s/s  of distress. Patients blood pressure was 94/80.  Patients denies any s/s of hypotension. Environment secured .Will continue to monitor for safely. Will notify on coming Rn.

## 2022-11-02 NOTE — ED Provider Notes (Signed)
Behavioral Health Progress Note  Date and Time: 11/02/2022 5:42 PM Name: Nicole [name] MRN:  [id number]  Nicole [name] is a 32 y.o. female, with PMH of SCZ unspecified, MDD with psychosis, who presented voluntary to [facility] (07/25/2022) via [facility] after a verbal altercation with [name] (not patient's legal guardian) for transient SI.  This is patient's fourth visit to [facility] for similar concerns this year. Patient has been dismissed from previous group home due to threatening SI and HI, then leaving the group home.  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  Subjective: Patient observed talking and laughing with another patient.  She is alert/oriented x 4, cooperative, and calm.  She has normal speech and behavior.  She is denying any depression and reports her mood as "good".  She denies any concerns with appetite or sleep.  She denies SI/HI/AVH.  She does not appear to be responding to internal/external stimuli.  States, "I will be glad when I get out of here".  Provided encouragement and reassurance.  She continues to be appropriate with staff and other patients.  She is compliant with medications.  She has not required any as needed medications for agitation.  TOC and DSS are continuing to seek placement.  Diagnosis:  Final diagnoses:  Tobacco use disorder  Schizophrenia, unspecified type ([place])  MDD (major depressive disorder), recurrent episode, moderate ([facility])  Hypothyroidism, unspecified type    Total Time spent with patient: 20 minutes  Past Psychiatric History: see H&P Past Medical History:  Past Medical History:  Diagnosis Date   Anxiety    Depression    Hypothyroidism 08/07/2022   Tobacco use disorder 08/07/2022    Past Surgical History:  Procedure Laterality Date   WISDOM TOOTH EXTRACTION Bilateral 2020   Family History:  Family History  Problem Relation Age of Onset   Hypertension Father    Diabetes Father    Family Psychiatric  History:  See H&P Social History:  Social History   Substance and Sexual Activity  Alcohol Use Never     Social History   Substance and Sexual Activity  Drug Use Never    Social History   Socioeconomic History   Marital status: Single    Spouse name: Not on file   Number of children: Not on file   Years of education: Not on file   Highest education level: Not on file  Occupational History   Not on file  Tobacco Use   Smoking status: Every Day    Types: Cigarettes   Smokeless tobacco: Never  Vaping Use   Vaping Use: Some days  Substance and Sexual Activity   Alcohol use: Never   Drug use: Never   Sexual activity: Yes    Partners: Female    Birth control/protection: Implant  Other Topics Concern   Not on file  Social History Narrative   Not on file   Social Determinants of Health   Financial Resource Strain: Not on file  Food Insecurity: Not on file  Transportation Needs: Not on file  Physical Activity: Not on file  Stress: Not on file  Social Connections: Not on file   SDOH:  SDOH Screenings   Depression (PHQ2-9): Low Risk  (10/28/2022)  Tobacco Use: High Risk (10/21/2022)   Additional Social History:                         Sleep: Good  Appetite:  Good  Current Medications:  Current  Facility-Administered Medications  Medication Dose Route Frequency Provider Last Rate Last Admin   acetaminophen (TYLENOL) tablet 650 mg  650 mg Oral Q6H PRN Marlou Sa, NP   650 mg at 10/28/22 1054   alum & mag hydroxide-simeth (MAALOX/MYLANTA) 200-200-20 MG/5ML suspension 30 mL  30 mL Oral Q4H PRN Marlou Sa, NP   30 mL at 10/26/22 1755   ARIPiprazole ER (ABILIFY MAINTENA) 400 MG prefilled syringe 400 mg  400 mg Intramuscular Q28 days Princess Bruins, DO   400 mg at 10/25/22 0920   cetaphil lotion   Topical PRN Princess Bruins, DO       fluticasone [facility]) 50 MCG/ACT nasal spray 1 spray  1 spray Each Nare QHS Princess Bruins, DO   1 spray at 11/01/22  2111   hydrOXYzine (ATARAX) tablet 25 mg  25 mg Oral TID PRN Lorri Frederick, MD   25 mg at 10/29/22 2115   ketoconazole (NIZORAL) 2 % cream   Topical BID Princess Bruins, DO   Given at 11/02/22 0915   levothyroxine (SYNTHROID) tablet 100 mcg  100 mcg Oral Once Byungura, Veronique M, NP       levothyroxine (SYNTHROID) tablet 100 mcg  100 mcg Oral Q0600 Marlou Sa, NP   100 mcg at 11/02/22 0600   lidocaine (LIDODERM) 5 % 1 patch  1 patch Transdermal Q24H Princess Bruins, DO   1 patch at 11/02/22 0914   loratadine (CLARITIN) tablet 10 mg  10 mg Oral Daily Carrion-Carrero, Karle Starch, MD   10 mg at 11/02/22 0914   magnesium hydroxide (MILK OF MAGNESIA) suspension 30 mL  30 mL Oral Daily PRN Marlou Sa, NP   30 mL at 10/12/22 2145   metFORMIN (GLUCOPHAGE) tablet 500 mg  500 mg Oral Q breakfast Carrion-Carrero, Karle Starch, MD   500 mg at 11/02/22 6962   nicotine (NICODERM CQ - dosed in mg/24 hours) patch 14 mg  14 mg Transdermal Daily PRN Carrion-Carrero, Karle Starch, MD       ondansetron (ZOFRAN-ODT) disintegrating tablet 4 mg  4 mg Oral Q8H PRN Carrion-Carrero, Margely, MD   4 mg at 11/01/22 2246   Oxcarbazepine (TRILEPTAL) tablet 300 mg  300 mg Oral BID Byungura, Veronique M, NP   300 mg at 11/02/22 0913   pantoprazole (PROTONIX) EC tablet 40 mg  40 mg Oral Daily Carrion-Carrero, Karle Starch, MD   40 mg at 11/02/22 0914   polyethylene glycol (MIRALAX / GLYCOLAX) packet 17 g  17 g Oral Daily Princess Bruins, DO   17 g at 11/02/22 0912   QUEtiapine (SEROQUEL) tablet 400 mg  400 mg Oral BID Rayburn Go, Veronique M, NP   400 mg at 11/02/22 0913   sertraline (ZOLOFT) tablet 150 mg  150 mg Oral Daily Carrion-Carrero, Margely, MD   150 mg at 11/02/22 0913   sodium chloride (OCEAN) 0.65 % nasal spray 1 spray  1 spray Each Nare Daily Princess Bruins, DO   1 spray at 11/02/22 0914   traZODone (DESYREL) tablet 100 mg  100 mg Oral QHS Olin Pia M, NP   100 mg at 11/01/22 2111   traZODone  (DESYREL) tablet 50 mg  50 mg Oral QHS PRN Marlou Sa, NP   50 mg at 10/28/22 2105   valACYclovir (VALTREX) tablet 500 mg  500 mg Oral Daily Carrion-Carrero, Karle Starch, MD   500 mg at 11/02/22 0913   zinc oxide (BALMEX) 11.3 % cream   Topical PRN Princess Bruins, DO  Current Outpatient Medications  Medication Sig Dispense Refill   ABILIFY MAINTENA 400 MG PRSY prefilled syringe 400 mg every 28 (twenty-eight) days.     cetirizine (ZYRTEC) 10 MG tablet Take 10 mg by mouth daily.     cyclobenzaprine (FLEXERIL) 10 MG tablet Take 1 tablet (10 mg total) by mouth 2 (two) times daily as needed for muscle spasms. 20 tablet 0   fluticasone (FLONASE) 50 MCG/ACT nasal spray Place 1 spray into both nostrils daily.     meloxicam (MOBIC) 15 MG tablet Take 15 mg by mouth daily.     nitrofurantoin, macrocrystal-monohydrate, (MACROBID) 100 MG capsule Take 1 capsule (100 mg total) by mouth 2 (two) times daily. 10 capsule 0   ondansetron (ZOFRAN-ODT) 4 MG disintegrating tablet Take 1 tablet (4 mg total) by mouth every 8 (eight) hours as needed for nausea or vomiting. 20 tablet 0   Oxcarbazepine (TRILEPTAL) 300 MG tablet Take 1 tablet (300 mg total) by mouth 2 (two) times daily. 60 tablet 0   QUEtiapine (SEROQUEL) 400 MG tablet Take 1 tablet (400 mg total) by mouth 2 (two) times daily. 60 tablet 0   sertraline (ZOLOFT) 50 MG tablet Take 3 tablets (150 mg total) by mouth in the morning. 90 tablet 0   traZODone (DESYREL) 100 MG tablet Take 1 tablet (100 mg total) by mouth at bedtime. 30 tablet 0   valACYclovir (VALTREX) 500 MG tablet Take 500 mg by mouth daily.      Labs  Lab Results:  Admission on 10/07/2022  Component Date Value Ref Range Status   Hgb A1c MFr Bld 10/15/2022 5.7 (H)  4.8 - 5.6 % Final   Comment: (NOTE)         Prediabetes: 5.7 - 6.4         Diabetes: >6.4         Glycemic control for adults with diabetes: <7.0    Mean Plasma Glucose 10/15/2022 117  mg/dL Final   Comment:  (NOTE) Performed At: [facility] [address] [place], [place] [id number] Jolene SchimkeNagendra Sanjai MD HQ:[id number]:[phone]   Office Visit on 10/21/2022  Component Date Value Ref Range Status   SARSCOV2ONAVIRUS 2 AG 11/01/2022 NEGATIVE  NEGATIVE Final   Comment: (NOTE) SARS-CoV-2 antigen NOT DETECTED.   Negative results are presumptive.  Negative results do not preclude SARS-CoV-2 infection and should not be used as the sole basis for treatment or other patient management decisions, including infection  control decisions, particularly in the presence of clinical signs and  symptoms consistent with COVID-19, or in those who have been in contact with the virus.  Negative results must be combined with clinical observations, patient history, and epidemiological information. The expected result is Negative.  Fact Sheet for Patients: https://www.jennings-kim.com/https://www.fda.gov/media/141569/download  Fact Sheet for Healthcare Providers: https://alexander-rogers.biz/https://www.fda.gov/media/141568/download  This test is not yet approved or cleared by the [place] FDA and  has been authorized for detection and/or diagnosis of SARS-CoV-2 by FDA under an Emergency Use Authorization (EUA).  This EUA will remain in effect (meaning this test can be used) for the duration of  the COV                          ID-19 declaration under Section 564(b)(1) of the Act, 21 U.S.C. section 360bbb-3(b)(1), unless the authorization is terminated or revoked sooner.    Admission on 10/07/2022, Discharged on 10/07/2022  Component Date Value Ref Range Status   Color, Urine 10/07/2022 YELLOW  YELLOW  Final   APPearance 10/07/2022 HAZY (A)  CLEAR Final   Specific Gravity, Urine 10/07/2022 1.011  1.005 - 1.030 Final   pH 10/07/2022 7.0  5.0 - 8.0 Final   Glucose, UA 10/07/2022 NEGATIVE  NEGATIVE mg/dL Final   Hgb urine dipstick 10/07/2022 SMALL (A)  NEGATIVE Final   Bilirubin Urine 10/07/2022 NEGATIVE  NEGATIVE Final   Ketones, ur 10/07/2022 NEGATIVE  NEGATIVE  mg/dL Final   Protein, ur 81/19/1478 NEGATIVE  NEGATIVE mg/dL Final   Nitrite 29/56/2130 NEGATIVE  NEGATIVE Final   Leukocytes,Ua 10/07/2022 LARGE (A)  NEGATIVE Final   RBC / HPF 10/07/2022 0-5  0 - 5 RBC/hpf Final   WBC, UA 10/07/2022 0-5  0 - 5 WBC/hpf Final   Bacteria, UA 10/07/2022 FEW (A)  NONE SEEN Final   Squamous Epithelial / HPF 10/07/2022 11-20  0 - 5 Final   Performed at [facility], [address], [place], [place] [address]   I-stat hCG, quantitative 10/07/2022 <5.0  <5 mIU/mL Final   Comment 3 10/07/2022          Final   Comment:   GEST. AGE      CONC.  (mIU/mL)   <=1 WEEK        5 - 50     2 WEEKS       50 - 500     3 WEEKS       100 - 10,000     4 WEEKS     1,000 - 30,000        FEMALE AND NON-PREGNANT FEMALE:     LESS THAN 5 mIU/mL    Sodium 10/07/2022 138  135 - 145 mmol/L Final   Potassium 10/07/2022 4.3  3.5 - 5.1 mmol/L Final   Chloride 10/07/2022 104  98 - 111 mmol/L Final   CO2 10/07/2022 28  22 - 32 mmol/L Final   Glucose, Bld 10/07/2022 90  70 - 99 mg/dL Final   Glucose reference range applies only to samples taken after fasting for at least 8 hours.   BUN 10/07/2022 8  6 - 20 mg/dL Final   Creatinine, Ser 10/07/2022 0.96  0.44 - 1.00 mg/dL Final   Calcium 46/96/2952 9.1  8.9 - 10.3 mg/dL Final   GFR, Estimated 10/07/2022 >60  >60 mL/min Final   Comment: (NOTE) Calculated using the CKD-EPI Creatinine Equation (2021)    Anion gap 10/07/2022 6  5 - 15 Final   Performed at [facility], [address] [place], [place] [address]   WBC 10/07/2022 5.8  4.0 - 10.5 K/uL Final   RBC 10/07/2022 4.36  3.87 - 5.11 MIL/uL Final   Hemoglobin 10/07/2022 12.9  12.0 - 15.0 g/dL Final   HCT 44/10/270 40.0  36.0 - 46.0 % Final   MCV 10/07/2022 91.7  80.0 - 100.0 fL Final   MCH 10/07/2022 29.6  26.0 - 34.0 pg Final   MCHC 10/07/2022 32.3  30.0 - 36.0 g/dL Final   RDW 53/66/4403 12.4  11.5 - 15.5 % Final   Platelets 10/07/2022 295  150 - 400 K/uL Final    nRBC 10/07/2022 0.0  0.0 - 0.2 % Final   Performed at [facility], [address] [place], [place] [address]   SARS Coronavirus 2 by RT PCR 10/07/2022 NEGATIVE  NEGATIVE Final   Comment: (NOTE) SARS-CoV-2 target nucleic acids are NOT DETECTED.  The SARS-CoV-2 RNA is generally detectable in upper respiratory specimens during the  acute phase of infection. The lowest concentration of SARS-CoV-2 viral copies this assay can detect is 138 copies/mL. A negative result does not preclude SARS-Cov-2 infection and should not be used as the sole basis for treatment or other patient management decisions. A negative result may occur with  improper specimen collection/handling, submission of specimen other than nasopharyngeal swab, presence of viral mutation(s) within the areas targeted by this assay, and inadequate number of viral copies(<138 copies/mL). A negative result must be combined with clinical observations, patient history, and epidemiological information. The expected result is Negative.  Fact Sheet for Patients:  BloggerCourse.com  Fact Sheet for Healthcare Providers:  SeriousBroker.it  This test is no                          t yet approved or cleared by the [place] FDA and  has been authorized for detection and/or diagnosis of SARS-CoV-2 by FDA under an Emergency Use Authorization (EUA). This EUA will remain  in effect (meaning this test can be used) for the duration of the COVID-19 declaration under Section 564(b)(1) of the Act, 21 U.S.C.section 360bbb-3(b)(1), unless the authorization is terminated  or revoked sooner.       Influenza A by PCR 10/07/2022 NEGATIVE  NEGATIVE Final   Influenza B by PCR 10/07/2022 NEGATIVE  NEGATIVE Final   Comment: (NOTE) The Xpert Xpress SARS-CoV-2/FLU/RSV plus assay is intended as an aid in the diagnosis of influenza from Nasopharyngeal swab specimens and should not be used as a  sole basis for treatment. Nasal washings and aspirates are unacceptable for Xpert Xpress SARS-CoV-2/FLU/RSV testing.  Fact Sheet for Patients: BloggerCourse.com  Fact Sheet for Healthcare Providers: SeriousBroker.it  This test is not yet approved or cleared by the [place] FDA and has been authorized for detection and/or diagnosis of SARS-CoV-2 by FDA under an Emergency Use Authorization (EUA). This EUA will remain in effect (meaning this test can be used) for the duration of the COVID-19 declaration under Section 564(b)(1) of the Act, 21 U.S.C. section 360bbb-3(b)(1), unless the authorization is terminated or revoked.     Resp Syncytial Virus by PCR 10/07/2022 NEGATIVE  NEGATIVE Final   Comment: (NOTE) Fact Sheet for Patients: BloggerCourse.com  Fact Sheet for Healthcare Providers: SeriousBroker.it  This test is not yet approved or cleared by the [place] FDA and has been authorized for detection and/or diagnosis of SARS-CoV-2 by FDA under an Emergency Use Authorization (EUA). This EUA will remain in effect (meaning this test can be used) for the duration of the COVID-19 declaration under Section 564(b)(1) of the Act, 21 U.S.C. section 360bbb-3(b)(1), unless the authorization is terminated or revoked.  Performed at [facility], [address] [place], [place] [address]    Specimen Description 10/07/2022 URINE, CLEAN CATCH   Final   Special Requests 10/07/2022    Final                   Value:NONE Performed at [facility], [address], [place], [place] [address]    Culture 10/07/2022 MULTIPLE SPECIES PRESENT, SUGGEST RECOLLECTION (A)   Final   Report Status 10/07/2022 10/08/2022 FINAL   Final  Admission on 07/25/2022, Discharged on 10/07/2022  Component Date Value Ref Range Status   SARS Coronavirus 2 by RT PCR 07/25/2022 NEGATIVE  NEGATIVE Final    Comment: (NOTE) SARS-CoV-2 target nucleic acids are NOT DETECTED.  The SARS-CoV-2 RNA is generally detectable in upper respiratory  specimens during the acute phase of infection. The lowest concentration of SARS-CoV-2 viral copies this assay can detect is 138 copies/mL. A negative result does not preclude SARS-Cov-2 infection and should not be used as the sole basis for treatment or other patient management decisions. A negative result may occur with  improper specimen collection/handling, submission of specimen other than nasopharyngeal swab, presence of viral mutation(s) within the areas targeted by this assay, and inadequate number of viral copies(<138 copies/mL). A negative result must be combined with clinical observations, patient history, and epidemiological information. The expected result is Negative.  Fact Sheet for Patients:  BloggerCourse.com  Fact Sheet for Healthcare Providers:  SeriousBroker.it  This test is no                          t yet approved or cleared by the [place] FDA and  has been authorized for detection and/or diagnosis of SARS-CoV-2 by FDA under an Emergency Use Authorization (EUA). This EUA will remain  in effect (meaning this test can be used) for the duration of the COVID-19 declaration under Section 564(b)(1) of the Act, 21 U.S.C.section 360bbb-3(b)(1), unless the authorization is terminated  or revoked sooner.       Influenza A by PCR 07/25/2022 NEGATIVE  NEGATIVE Final   Influenza B by PCR 07/25/2022 NEGATIVE  NEGATIVE Final   Comment: (NOTE) The Xpert Xpress SARS-CoV-2/FLU/RSV plus assay is intended as an aid in the diagnosis of influenza from Nasopharyngeal swab specimens and should not be used as a sole basis for treatment. Nasal washings and aspirates are unacceptable for Xpert Xpress SARS-CoV-2/FLU/RSV testing.  Fact Sheet for  Patients: BloggerCourse.com  Fact Sheet for Healthcare Providers: SeriousBroker.it  This test is not yet approved or cleared by the [place] FDA and has been authorized for detection and/or diagnosis of SARS-CoV-2 by FDA under an Emergency Use Authorization (EUA). This EUA will remain in effect (meaning this test can be used) for the duration of the COVID-19 declaration under Section 564(b)(1) of the Act, 21 U.S.C. section 360bbb-3(b)(1), unless the authorization is terminated or revoked.  Performed at [facility], [address] [place], [place] [address]    WBC 07/25/2022 8.3  4.0 - 10.5 K/uL Final   RBC 07/25/2022 4.44  3.87 - 5.11 MIL/uL Final   Hemoglobin 07/25/2022 13.7  12.0 - 15.0 g/dL Final   HCT 60/45/4098 40.2  36.0 - 46.0 % Final   MCV 07/25/2022 90.5  80.0 - 100.0 fL Final   MCH 07/25/2022 30.9  26.0 - 34.0 pg Final   MCHC 07/25/2022 34.1  30.0 - 36.0 g/dL Final   RDW 11/91/4782 12.2  11.5 - 15.5 % Final   Platelets 07/25/2022 248  150 - 400 K/uL Final   nRBC 07/25/2022 0.0  0.0 - 0.2 % Final   Neutrophils Relative % 07/25/2022 43  % Final   Neutro Abs 07/25/2022 3.6  1.7 - 7.7 K/uL Final   Lymphocytes Relative 07/25/2022 52  % Final   Lymphs Abs 07/25/2022 4.2 (H)  0.7 - 4.0 K/uL Final   Monocytes Relative 07/25/2022 5  % Final   Monocytes Absolute 07/25/2022 0.4  0.1 - 1.0 K/uL Final   Eosinophils Relative 07/25/2022 0  % Final   Eosinophils Absolute 07/25/2022 0.0  0.0 - 0.5 K/uL Final   Basophils Relative 07/25/2022 0  % Final   Basophils Absolute 07/25/2022 0.0  0.0 - 0.1 K/uL Final   Immature  Granulocytes 07/25/2022 0  % Final   Abs Immature Granulocytes 07/25/2022 0.02  0.00 - 0.07 K/uL Final   Performed at [facility], [address] [place], [place] [address]   Sodium 07/25/2022 138  135 - 145 mmol/L Final   Potassium 07/25/2022 4.0  3.5 - 5.1 mmol/L Final   Chloride 07/25/2022 104   98 - 111 mmol/L Final   CO2 07/25/2022 29  22 - 32 mmol/L Final   Glucose, Bld 07/25/2022 83  70 - 99 mg/dL Final   Glucose reference range applies only to samples taken after fasting for at least 8 hours.   BUN 07/25/2022 11  6 - 20 mg/dL Final   Creatinine, Ser 07/25/2022 0.97  0.44 - 1.00 mg/dL Final   Calcium 60/45/4098 9.2  8.9 - 10.3 mg/dL Final   Total Protein 11/91/4782 7.0  6.5 - 8.1 g/dL Final   Albumin 95/62/1308 3.8  3.5 - 5.0 g/dL Final   AST 65/78/4696 18  15 - 41 U/L Final   ALT 07/25/2022 22  0 - 44 U/L Final   Alkaline Phosphatase 07/25/2022 64  38 - 126 U/L Final   Total Bilirubin 07/25/2022 0.2 (L)  0.3 - 1.2 mg/dL Final   GFR, Estimated 07/25/2022 >60  >60 mL/min Final   Comment: (NOTE) Calculated using the CKD-EPI Creatinine Equation (2021)    Anion gap 07/25/2022 5  5 - 15 Final   Performed at [facility], [address], [place], [place] [address]   POC Amphetamine UR 07/25/2022 None Detected  NONE DETECTED (Cut Off Level 1000 ng/mL) Preliminary   POC Secobarbital (BAR) 07/25/2022 None Detected  NONE DETECTED (Cut Off Level 300 ng/mL) Preliminary   POC Buprenorphine (BUP) 07/25/2022 None Detected  NONE DETECTED (Cut Off Level 10 ng/mL) Preliminary   POC Oxazepam (BZO) 07/25/2022 None Detected  NONE DETECTED (Cut Off Level 300 ng/mL) Preliminary   POC Cocaine UR 07/25/2022 None Detected  NONE DETECTED (Cut Off Level 300 ng/mL) Preliminary   POC Methamphetamine UR 07/25/2022 None Detected  NONE DETECTED (Cut Off Level 1000 ng/mL) Preliminary   POC Morphine 07/25/2022 None Detected  NONE DETECTED (Cut Off Level 300 ng/mL) Preliminary   POC Methadone UR 07/25/2022 None Detected  NONE DETECTED (Cut Off Level 300 ng/mL) Preliminary   POC Oxycodone UR 07/25/2022 None Detected  NONE DETECTED (Cut Off Level 100 ng/mL) Preliminary   POC Marijuana UR 07/25/2022 None Detected  NONE DETECTED (Cut Off Level 50 ng/mL) Preliminary   SARSCOV2ONAVIRUS 2 AG 07/25/2022 NEGATIVE   NEGATIVE Final   Comment: (NOTE) SARS-CoV-2 antigen NOT DETECTED.   Negative results are presumptive.  Negative results do not preclude SARS-CoV-2 infection and should not be used as the sole basis for treatment or other patient management decisions, including infection  control decisions, particularly in the presence of clinical signs and  symptoms consistent with COVID-19, or in those who have been in contact with the virus.  Negative results must be combined with clinical observations, patient history, and epidemiological information. The expected result is Negative.  Fact Sheet for Patients: https://www.jennings-kim.com/  Fact Sheet for Healthcare Providers: https://alexander-rogers.biz/  This test is not yet approved or cleared by the [place] FDA and  has been authorized for detection and/or diagnosis of SARS-CoV-2 by FDA under an Emergency Use Authorization (EUA).  This EUA will remain in effect (meaning this test can be used) for the duration of  the COV  ID-19 declaration under Section 564(b)(1) of the Act, 21 U.S.C. section 360bbb-3(b)(1), unless the authorization is terminated or revoked sooner.     Cholesterol 07/25/2022 181  0 - 200 mg/dL Final   Triglycerides 16/07/9603 118  <150 mg/dL Final   HDL 54/06/8118 45  >40 mg/dL Final   Total CHOL/HDL Ratio 07/25/2022 4.0  RATIO Final   VLDL 07/25/2022 24  0 - 40 mg/dL Final   LDL Cholesterol 07/25/2022 112 (H)  0 - 99 mg/dL Final   Comment:        Total Cholesterol/HDL:CHD Risk Coronary Heart Disease Risk Table                     Men   Women  1/2 Average Risk   3.4   3.3  Average Risk       5.0   4.4  2 X Average Risk   9.6   7.1  3 X Average Risk  23.4   11.0        Use the calculated Patient Ratio above and the CHD Risk Table to determine the patient's CHD Risk.        ATP III CLASSIFICATION (LDL):  <100     mg/dL   Optimal  147-829  mg/dL   Near or  Above                    Optimal  130-159  mg/dL   Borderline  562-130  mg/dL   High  >865     mg/dL   Very High Performed at [facility], [address], [place], [place] [address]    TSH 07/25/2022 6.668 (H)  0.350 - 4.500 uIU/mL Final   Comment: Performed by a 3rd Generation assay with a functional sensitivity of <=0.01 uIU/mL. Performed at [facility], [address], [place], [place] [address]    Glucose-Capillary 07/26/2022 104 (H)  70 - 99 mg/dL Final   Glucose reference range applies only to samples taken after fasting for at least 8 hours.   T3, Free 07/31/2022 2.3  2.0 - 4.4 pg/mL Final   Comment: (NOTE) Performed At: [facility] [address] [place], [place] [id number] Jolene Schimke MD UU:[id number]    Free T4 07/31/2022 0.60 (L)  0.61 - 1.12 ng/dL Final   Comment: (NOTE) Biotin ingestion may interfere with free T4 tests. If the results are inconsistent with the TSH level, previous test results, or the clinical presentation, then consider biotin interference. If needed, order repeat testing after stopping biotin. Performed at [facility], [address] [place], [place] [address]    Glucose-Capillary 08/29/2022 100 (H)  70 - 99 mg/dL Final   Glucose reference range applies only to samples taken after fasting for at least 8 hours.   TSH 09/05/2022 0.793  0.350 - 4.500 uIU/mL Final   Comment: Performed by a 3rd Generation assay with a functional sensitivity of <=0.01 uIU/mL. Performed at [facility], [address], [place], [place] [address]    Free T4 09/05/2022 0.73  0.61 - 1.12 ng/dL Final   Comment: (NOTE) Biotin ingestion may interfere with free T4 tests. If the results are inconsistent with the TSH level, previous test results, or the clinical presentation, then consider biotin interference. If needed, order repeat testing after stopping biotin. Performed at [facility], [address] [place],  [place] [address]    Preg Test, Ur 09/06/2022 Negative  Negative Final   Preg Test, Ur 09/06/2022 NEGATIVE  NEGATIVE Final   Comment:        THE SENSITIVITY OF THIS METHODOLOGY IS >24 mIU/mL    Preg Test, Ur 09/05/2022 NEGATIVE  NEGATIVE Final   Comment:        THE SENSITIVITY OF THIS METHODOLOGY IS >24 mIU/mL    Sodium 09/13/2022 136  135 - 145 mmol/L Final   Potassium 09/13/2022 4.5  3.5 - 5.1 mmol/L Final   Chloride 09/13/2022 105  98 - 111 mmol/L Final   CO2 09/13/2022 21 (L)  22 - 32 mmol/L Final   Glucose, Bld 09/13/2022 111 (H)  70 - 99 mg/dL Final   Glucose reference range applies only to samples taken after fasting for at least 8 hours.   BUN 09/13/2022 14  6 - 20 mg/dL Final   Creatinine, Ser 09/13/2022 0.92  0.44 - 1.00 mg/dL Final   Calcium 96/01/5408 9.1  8.9 - 10.3 mg/dL Final   GFR, Estimated 09/13/2022 >60  >60 mL/min Final   Comment: (NOTE) Calculated using the CKD-EPI Creatinine Equation (2021)    Anion gap 09/13/2022 10  5 - 15 Final   Performed at [facility], [address] [place], [place] [address]   Vitamin B-12 09/13/2022 585  180 - 914 pg/mL Final   Comment: (NOTE) This assay is not validated for testing neonatal or myeloproliferative syndrome specimens for Vitamin B12 levels. Performed at [facility], [address] [place], [place] [address]    Color, Urine 09/14/2022 YELLOW  YELLOW Final   APPearance 09/14/2022 HAZY (A)  CLEAR Final   Specific Gravity, Urine 09/14/2022 1.025  1.005 - 1.030 Final   pH 09/14/2022 5.0  5.0 - 8.0 Final   Glucose, UA 09/14/2022 NEGATIVE  NEGATIVE mg/dL Final   Hgb urine dipstick 09/14/2022 NEGATIVE  NEGATIVE Final   Bilirubin Urine 09/14/2022 NEGATIVE  NEGATIVE Final   Ketones, ur 09/14/2022 NEGATIVE  NEGATIVE mg/dL Final   Protein, ur 56/21/3086 NEGATIVE  NEGATIVE mg/dL Final   Nitrite 57/84/6962 NEGATIVE  NEGATIVE Final   Leukocytes,Ua 09/14/2022 TRACE (A)  NEGATIVE Final   RBC / HPF 09/14/2022 0-5  0 -  5 RBC/hpf Final   WBC, UA 09/14/2022 0-5  0 - 5 WBC/hpf Final   Bacteria, UA 09/14/2022 RARE (A)  NONE SEEN Final   Squamous Epithelial / HPF 09/14/2022 0-5  0 - 5 Final   Mucus 09/14/2022 PRESENT   Final   Performed at [facility], [address], [place], [place] [address]   Glucose-Capillary 09/16/2022 105 (H)  70 - 99 mg/dL Final   Glucose reference range applies only to samples taken after fasting for at least 8 hours.   SARS Coronavirus 2 by RT PCR 09/30/2022 NEGATIVE  NEGATIVE Final   Comment: (NOTE) SARS-CoV-2 target nucleic acids are NOT DETECTED.  The SARS-CoV-2 RNA is generally detectable in upper respiratory specimens during the acute phase of infection. The lowest concentration of SARS-CoV-2 viral copies this assay can detect is 138 copies/mL. A negative result does not preclude SARS-Cov-2 infection and should not be used as the sole basis for treatment or other patient management decisions. A negative result may occur with  improper specimen collection/handling, submission of specimen other than nasopharyngeal swab, presence of viral mutation(s) within the areas targeted by this assay, and inadequate number of viral copies(<138 copies/mL). A negative result must be combined with clinical observations, patient history, and epidemiological information. The  expected result is Negative.  Fact Sheet for Patients:  BloggerCourse.com  Fact Sheet for Healthcare Providers:  SeriousBroker.it  This test is no                          t yet approved or cleared by the [place] FDA and  has been authorized for detection and/or diagnosis of SARS-CoV-2 by FDA under an Emergency Use Authorization (EUA). This EUA will remain  in effect (meaning this test can be used) for the duration of the COVID-19 declaration under Section 564(b)(1) of the Act, 21 U.S.C.section 360bbb-3(b)(1), unless the authorization is terminated  or  revoked sooner.       Influenza A by PCR 09/30/2022 NEGATIVE  NEGATIVE Final   Influenza B by PCR 09/30/2022 NEGATIVE  NEGATIVE Final   Comment: (NOTE) The Xpert Xpress SARS-CoV-2/FLU/RSV plus assay is intended as an aid in the diagnosis of influenza from Nasopharyngeal swab specimens and should not be used as a sole basis for treatment. Nasal washings and aspirates are unacceptable for Xpert Xpress SARS-CoV-2/FLU/RSV testing.  Fact Sheet for Patients: BloggerCourse.com  Fact Sheet for Healthcare Providers: SeriousBroker.it  This test is not yet approved or cleared by the [place] FDA and has been authorized for detection and/or diagnosis of SARS-CoV-2 by FDA under an Emergency Use Authorization (EUA). This EUA will remain in effect (meaning this test can be used) for the duration of the COVID-19 declaration under Section 564(b)(1) of the Act, 21 U.S.C. section 360bbb-3(b)(1), unless the authorization is terminated or revoked.     Resp Syncytial Virus by PCR 09/30/2022 NEGATIVE  NEGATIVE Final   Comment: (NOTE) Fact Sheet for Patients: BloggerCourse.com  Fact Sheet for Healthcare Providers: SeriousBroker.it  This test is not yet approved or cleared by the [place] FDA and has been authorized for detection and/or diagnosis of SARS-CoV-2 by FDA under an Emergency Use Authorization (EUA). This EUA will remain in effect (meaning this test can be used) for the duration of the COVID-19 declaration under Section 564(b)(1) of the Act, 21 U.S.C. section 360bbb-3(b)(1), unless the authorization is terminated or revoked.  Performed at [facility], [address], [place], [place] [address]    Sodium 10/01/2022 136  135 - 145 mmol/L Final   Potassium 10/01/2022 3.8  3.5 - 5.1 mmol/L Final   Chloride 10/01/2022 104  98 - 111 mmol/L Final   CO2 10/01/2022 27  22 - 32  mmol/L Final   Glucose, Bld 10/01/2022 98  70 - 99 mg/dL Final   Glucose reference range applies only to samples taken after fasting for at least 8 hours.   BUN 10/01/2022 12  6 - 20 mg/dL Final   Creatinine, Ser 10/01/2022 0.98  0.44 - 1.00 mg/dL Final   Calcium 60/45/4098 8.9  8.9 - 10.3 mg/dL Final   GFR, Estimated 10/01/2022 >60  >60 mL/min Final   Comment: (NOTE) Calculated using the CKD-EPI Creatinine Equation (2021)    Anion gap 10/01/2022 5  5 - 15 Final   Performed at [facility], [address], [place], [place] [address]   WBC 10/01/2022 5.1  4.0 - 10.5 K/uL Final   RBC 10/01/2022 4.31  3.87 - 5.11 MIL/uL Final   Hemoglobin 10/01/2022 12.7  12.0 - 15.0 g/dL Final   HCT 78/29/5621 38.8  36.0 - 46.0 % Final   MCV 10/01/2022 90.0  80.0 - 100.0 fL Final   MCH 10/01/2022 29.5  26.0 - 34.0 pg Final   MCHC 10/01/2022 32.7  30.0 - 36.0 g/dL Final   RDW 95/06/3266 12.4  11.5 - 15.5 % Final   Platelets 10/01/2022 300  150 - 400 K/uL Final   nRBC 10/01/2022 0.0  0.0 - 0.2 % Final   Performed at [facility], [address], [place], [place] [address]   Lipase 10/01/2022 29  11 - 51 U/L Final   Performed at [facility], [address] [place], [place] [address]   Color, Urine 10/05/2022 YELLOW  YELLOW Final   APPearance 10/05/2022 HAZY (A)  CLEAR Final   Specific Gravity, Urine 10/05/2022 1.018  1.005 - 1.030 Final   pH 10/05/2022 5.0  5.0 - 8.0 Final   Glucose, UA 10/05/2022 NEGATIVE  NEGATIVE mg/dL Final   Hgb urine dipstick 10/05/2022 NEGATIVE  NEGATIVE Final   Bilirubin Urine 10/05/2022 NEGATIVE  NEGATIVE Final   Ketones, ur 10/05/2022 NEGATIVE  NEGATIVE mg/dL Final   Protein, ur 38/25/0539 NEGATIVE  NEGATIVE mg/dL Final   Nitrite 76/73/4193 NEGATIVE  NEGATIVE Final   Leukocytes,Ua 10/05/2022 TRACE (A)  NEGATIVE Final   RBC / HPF 10/05/2022 0-5  0 - 5 RBC/hpf Final   WBC, UA 10/05/2022 0-5  0 - 5 WBC/hpf Final   Bacteria, UA 10/05/2022 RARE (A)  NONE SEEN  Final   Squamous Epithelial / HPF 10/05/2022 0-5  0 - 5 Final   Mucus 10/05/2022 PRESENT   Final   Performed at [facility], [address], [place], [place] [address]   Specimen Description 10/05/2022 URINE, CLEAN CATCH   Final   Special Requests 10/05/2022    Final                   Value:NONE Performed at [facility], [address], [place], [place] [address]    Culture 10/05/2022 10,000 COLONIES/mL MULTIPLE SPECIES PRESENT, SUGGEST RECOLLECTION (A)   Final   Report Status 10/05/2022 10/07/2022 FINAL   Final  Admission on 07/04/2022, Discharged on 07/05/2022  Component Date Value Ref Range Status   Sodium 07/04/2022 142  135 - 145 mmol/L Final   Potassium 07/04/2022 4.4  3.5 - 5.1 mmol/L Final   Chloride 07/04/2022 109  98 - 111 mmol/L Final   CO2 07/04/2022 26  22 - 32 mmol/L Final   Glucose, Bld 07/04/2022 96  70 - 99 mg/dL Final   Glucose reference range applies only to samples taken after fasting for at least 8 hours.   BUN 07/04/2022 15  6 - 20 mg/dL Final   Creatinine, Ser 07/04/2022 0.83  0.44 - 1.00 mg/dL Final   Calcium 32/99/2426 9.3  8.9 - 10.3 mg/dL Final   Total Protein 83/41/9622 7.2  6.5 - 8.1 g/dL Final   Albumin 29/79/8921 3.9  3.5 - 5.0 g/dL Final   AST 19/41/7408 23  15 - 41 U/L Final   ALT 07/04/2022 28  0 - 44 U/L Final   Alkaline Phosphatase 07/04/2022 77  38 - 126 U/L Final   Total Bilirubin 07/04/2022 0.4  0.3 - 1.2 mg/dL Final   GFR, Estimated 07/04/2022 >60  >60 mL/min Final   Comment: (NOTE) Calculated using the CKD-EPI Creatinine Equation (2021)    Anion gap 07/04/2022 7  5 - 15 Final   Performed at [facility], [address], [place], [place] [address]   Alcohol, Ethyl (B) 07/04/2022 <10  <10 mg/dL Final   Comment: (NOTE)  Lowest detectable limit for serum alcohol is 10 mg/dL.  For medical purposes only. Performed at [facility], [address], [place], [place] [address]    WBC 07/04/2022  7.0  4.0 - 10.5 K/uL Final   RBC 07/04/2022 4.18  3.87 - 5.11 MIL/uL Final   Hemoglobin 07/04/2022 12.8  12.0 - 15.0 g/dL Final   HCT 60/45/4098 39.1  36.0 - 46.0 % Final   MCV 07/04/2022 93.5  80.0 - 100.0 fL Final   MCH 07/04/2022 30.6  26.0 - 34.0 pg Final   MCHC 07/04/2022 32.7  30.0 - 36.0 g/dL Final   RDW 11/91/4782 12.9  11.5 - 15.5 % Final   Platelets 07/04/2022 243  150 - 400 K/uL Final   nRBC 07/04/2022 0.0  0.0 - 0.2 % Final   Neutrophils Relative % 07/04/2022 43  % Final   Neutro Abs 07/04/2022 3.0  1.7 - 7.7 K/uL Final   Lymphocytes Relative 07/04/2022 50  % Final   Lymphs Abs 07/04/2022 3.5  0.7 - 4.0 K/uL Final   Monocytes Relative 07/04/2022 7  % Final   Monocytes Absolute 07/04/2022 0.5  0.1 - 1.0 K/uL Final   Eosinophils Relative 07/04/2022 0  % Final   Eosinophils Absolute 07/04/2022 0.0  0.0 - 0.5 K/uL Final   Basophils Relative 07/04/2022 0  % Final   Basophils Absolute 07/04/2022 0.0  0.0 - 0.1 K/uL Final   Immature Granulocytes 07/04/2022 0  % Final   Abs Immature Granulocytes 07/04/2022 0.01  0.00 - 0.07 K/uL Final   Performed at [facility], [address] [place], [place] [address]   I-stat hCG, quantitative 07/04/2022 <5.0  <5 mIU/mL Final   Comment 3 07/04/2022          Final   Comment:   GEST. AGE      CONC.  (mIU/mL)   <=1 WEEK        5 - 50     2 WEEKS       50 - 500     3 WEEKS       100 - 10,000     4 WEEKS     1,000 - 30,000        FEMALE AND NON-PREGNANT FEMALE:     LESS THAN 5 mIU/mL   Admission on 06/14/2022, Discharged on 06/14/2022  Component Date Value Ref Range Status   Color, UA 06/14/2022 yellow  yellow Final   Clarity, UA 06/14/2022 cloudy (A)  clear Final   Glucose, UA 06/14/2022 negative  negative mg/dL Final   Bilirubin, UA 30/86/5784 negative  negative Final   Ketones, POC UA 06/14/2022 negative  negative mg/dL Final   Spec Grav, UA 69/62/9528 1.025  1.010 - 1.025 Final   Blood, UA 06/14/2022 negative   negative Final   pH, UA 06/14/2022 7.5  5.0 - 8.0 Final   Protein Ur, POC 06/14/2022 =30 (A)  negative mg/dL Final   Urobilinogen, UA 06/14/2022 0.2  0.2 or 1.0 E.U./dL Final   Nitrite, UA 41/32/4401 Negative  Negative Final   Leukocytes, UA 06/14/2022 Negative  Negative Final   Preg Test, Ur 06/14/2022 Negative  Negative Final   Specimen Description 06/14/2022 URINE, CLEAN CATCH   Final   Special Requests 06/14/2022    Final                   Value:NONE Performed at [facility], [address] [place], [place] [address]  Culture 06/14/2022 MULTIPLE SPECIES PRESENT, SUGGEST RECOLLECTION (A)   Final   Report Status 06/14/2022 06/16/2022 FINAL   Final  Admission on 06/07/2022, Discharged on 06/10/2022  Component Date Value Ref Range Status   SARS Coronavirus 2 by RT PCR 06/07/2022 NEGATIVE  NEGATIVE Final   Comment: (NOTE) SARS-CoV-2 target nucleic acids are NOT DETECTED.  The SARS-CoV-2 RNA is generally detectable in upper respiratory specimens during the acute phase of infection. The lowest concentration of SARS-CoV-2 viral copies this assay can detect is 138 copies/mL. A negative result does not preclude SARS-Cov-2 infection and should not be used as the sole basis for treatment or other patient management decisions. A negative result may occur with  improper specimen collection/handling, submission of specimen other than nasopharyngeal swab, presence of viral mutation(s) within the areas targeted by this assay, and inadequate number of viral copies(<138 copies/mL). A negative result must be combined with clinical observations, patient history, and epidemiological information. The expected result is Negative.  Fact Sheet for Patients:  BloggerCourse.comhttps://www.fda.gov/media/152166/download  Fact Sheet for Healthcare Providers:  SeriousBroker.ithttps://www.fda.gov/media/152162/download  This test is no                          t yet approved or cleared by the [place] FDA and  has been  authorized for detection and/or diagnosis of SARS-CoV-2 by FDA under an Emergency Use Authorization (EUA). This EUA will remain  in effect (meaning this test can be used) for the duration of the COVID-19 declaration under Section 564(b)(1) of the Act, 21 U.S.C.section 360bbb-3(b)(1), unless the authorization is terminated  or revoked sooner.       Influenza A by PCR 06/07/2022 NEGATIVE  NEGATIVE Final   Influenza B by PCR 06/07/2022 NEGATIVE  NEGATIVE Final   Comment: (NOTE) The Xpert Xpress SARS-CoV-2/FLU/RSV plus assay is intended as an aid in the diagnosis of influenza from Nasopharyngeal swab specimens and should not be used as a sole basis for treatment. Nasal washings and aspirates are unacceptable for Xpert Xpress SARS-CoV-2/FLU/RSV testing.  Fact Sheet for Patients: BloggerCourse.comhttps://www.fda.gov/media/152166/download  Fact Sheet for Healthcare Providers: SeriousBroker.ithttps://www.fda.gov/media/152162/download  This test is not yet approved or cleared by the [place] FDA and has been authorized for detection and/or diagnosis of SARS-CoV-2 by FDA under an Emergency Use Authorization (EUA). This EUA will remain in effect (meaning this test can be used) for the duration of the COVID-19 declaration under Section 564(b)(1) of the Act, 21 U.S.C. section 360bbb-3(b)(1), unless the authorization is terminated or revoked.  Performed at [facility], [address] [place], [place] [address]    WBC 06/07/2022 5.7  4.0 - 10.5 K/uL Final   RBC 06/07/2022 4.39  3.87 - 5.11 MIL/uL Final   Hemoglobin 06/07/2022 13.2  12.0 - 15.0 g/dL Final   HCT 21/30/865708/25/2023 40.4  36.0 - 46.0 % Final   MCV 06/07/2022 92.0  80.0 - 100.0 fL Final   MCH 06/07/2022 30.1  26.0 - 34.0 pg Final   MCHC 06/07/2022 32.7  30.0 - 36.0 g/dL Final   RDW 84/69/629508/25/2023 12.4  11.5 - 15.5 % Final   Platelets 06/07/2022 308  150 - 400 K/uL Final   nRBC 06/07/2022 0.0  0.0 - 0.2 % Final   Neutrophils Relative % 06/07/2022 42  %  Final   Neutro Abs 06/07/2022 2.4  1.7 - 7.7 K/uL Final   Lymphocytes Relative 06/07/2022 54  % Final   Lymphs Abs 06/07/2022 3.1  0.7 -  4.0 K/uL Final   Monocytes Relative 06/07/2022 4  % Final   Monocytes Absolute 06/07/2022 0.2  0.1 - 1.0 K/uL Final   Eosinophils Relative 06/07/2022 0  % Final   Eosinophils Absolute 06/07/2022 0.0  0.0 - 0.5 K/uL Final   Basophils Relative 06/07/2022 0  % Final   Basophils Absolute 06/07/2022 0.0  0.0 - 0.1 K/uL Final   Immature Granulocytes 06/07/2022 0  % Final   Abs Immature Granulocytes 06/07/2022 0.01  0.00 - 0.07 K/uL Final   Performed at [facility], [address], [place], [place] [address]   Sodium 06/07/2022 139  135 - 145 mmol/L Final   Potassium 06/07/2022 4.0  3.5 - 5.1 mmol/L Final   Chloride 06/07/2022 104  98 - 111 mmol/L Final   CO2 06/07/2022 28  22 - 32 mmol/L Final   Glucose, Bld 06/07/2022 104 (H)  70 - 99 mg/dL Final   Glucose reference range applies only to samples taken after fasting for at least 8 hours.   BUN 06/07/2022 8  6 - 20 mg/dL Final   Creatinine, Ser 06/07/2022 0.84  0.44 - 1.00 mg/dL Final   Calcium 40/98/1191 9.1  8.9 - 10.3 mg/dL Final   Total Protein 47/82/9562 6.9  6.5 - 8.1 g/dL Final   Albumin 13/05/6577 3.7  3.5 - 5.0 g/dL Final   AST 46/96/2952 19  15 - 41 U/L Final   ALT 06/07/2022 22  0 - 44 U/L Final   Alkaline Phosphatase 06/07/2022 54  38 - 126 U/L Final   Total Bilirubin 06/07/2022 0.4  0.3 - 1.2 mg/dL Final   GFR, Estimated 06/07/2022 >60  >60 mL/min Final   Comment: (NOTE) Calculated using the CKD-EPI Creatinine Equation (2021)    Anion gap 06/07/2022 7  5 - 15 Final   Performed at [facility], [address] [place], [place] [address]   Hgb A1c MFr Bld 06/07/2022 5.0  4.8 - 5.6 % Final   Comment: (NOTE) Pre diabetes:          5.7%-6.4%  Diabetes:              >6.4%  Glycemic control for   <7.0% adults with diabetes    Mean Plasma Glucose 06/07/2022 96.8  mg/dL Final    Performed at [facility], [address], [place], [place] [address]   TSH 06/07/2022 1.620  0.350 - 4.500 uIU/mL Final   Comment: Performed by a 3rd Generation assay with a functional sensitivity of <=0.01 uIU/mL. Performed at [facility], [address], [place], [place] [address]    RPR Ser Ql 06/07/2022 NON REACTIVE  NON REACTIVE Final   Performed at [facility], [address], [place], [place] [address]   Color, Urine 06/07/2022 YELLOW  YELLOW Final   APPearance 06/07/2022 HAZY (A)  CLEAR Final   Specific Gravity, Urine 06/07/2022 1.018  1.005 - 1.030 Final   pH 06/07/2022 7.0  5.0 - 8.0 Final   Glucose, UA 06/07/2022 NEGATIVE  NEGATIVE mg/dL Final   Hgb urine dipstick 06/07/2022 NEGATIVE  NEGATIVE Final   Bilirubin Urine 06/07/2022 NEGATIVE  NEGATIVE Final   Ketones, ur 06/07/2022 NEGATIVE  NEGATIVE mg/dL Final   Protein, ur 34/74/2595 NEGATIVE  NEGATIVE mg/dL Final   Nitrite 63/87/5643 NEGATIVE  NEGATIVE Final   Leukocytes,Ua 06/07/2022 NEGATIVE  NEGATIVE Final   Performed at [facility], [address] [place], [place] [address]  Cholesterol 06/07/2022 164  0 - 200 mg/dL Final   Triglycerides 16/10/960408/25/2023 158 (H)  <150 mg/dL Final   HDL 54/09/811908/25/2023 44  >40 mg/dL Final   Total CHOL/HDL Ratio 06/07/2022 3.7  RATIO Final   VLDL 06/07/2022 32  0 - 40 mg/dL Final   LDL Cholesterol 06/07/2022 88  0 - 99 mg/dL Final   Comment:        Total Cholesterol/HDL:CHD Risk Coronary Heart Disease Risk Table                     Men   Women  1/2 Average Risk   3.4   3.3  Average Risk       5.0   4.4  2 X Average Risk   9.6   7.1  3 X Average Risk  23.4   11.0        Use the calculated Patient Ratio above and the CHD Risk Table to determine the patient's CHD Risk.        ATP III CLASSIFICATION (LDL):  <100     mg/dL   Optimal  [phone]  mg/dL   Near or Above                    Optimal  130-159  mg/dL   Borderline  [phone]  mg/dL   High  >865>190     mg/dL   Very  High Performed at [facility], [address] [place], [place] [address]    HIV Screen 4th Generation wRfx 06/07/2022 Non Reactive  Non Reactive Final   Performed at [facility], [address] [place], [place] [address]   SARSCOV2ONAVIRUS 2 AG 06/07/2022 NEGATIVE  NEGATIVE Final   Comment: (NOTE) SARS-CoV-2 antigen NOT DETECTED.   Negative results are presumptive.  Negative results do not preclude SARS-CoV-2 infection and should not be used as the sole basis for treatment or other patient management decisions, including infection  control decisions, particularly in the presence of clinical signs and  symptoms consistent with COVID-19, or in those who have been in contact with the virus.  Negative results must be combined with clinical observations, patient history, and epidemiological information. The expected result is Negative.  Fact Sheet for Patients: https://www.jennings-kim.com/https://www.fda.gov/media/141569/download  Fact Sheet for Healthcare Providers: https://alexander-rogers.biz/https://www.fda.gov/media/141568/download  This test is not yet approved or cleared by the [place] FDA and  has been authorized for detection and/or diagnosis of SARS-CoV-2 by FDA under an Emergency Use Authorization (EUA).  This EUA will remain in effect (meaning this test can be used) for the duration of  the COV                          ID-19 declaration under Section 564(b)(1) of the Act, 21 U.S.C. section 360bbb-3(b)(1), unless the authorization is terminated or revoked sooner.     POC Amphetamine UR 06/07/2022 None Detected  NONE DETECTED (Cut Off Level 1000 ng/mL) Final   POC Secobarbital (BAR) 06/07/2022 None Detected  NONE DETECTED (Cut Off Level 300 ng/mL) Final   POC Buprenorphine (BUP) 06/07/2022 None Detected  NONE DETECTED (Cut Off Level 10 ng/mL) Final   POC Oxazepam (BZO) 06/07/2022 None Detected  NONE DETECTED (Cut Off Level 300 ng/mL) Final   POC Cocaine UR 06/07/2022 None Detected  NONE DETECTED (Cut Off  Level 300 ng/mL) Final   POC Methamphetamine UR 06/07/2022 None Detected  NONE DETECTED (Cut Off Level 1000  ng/mL) Final   POC Morphine 06/07/2022 None Detected  NONE DETECTED (Cut Off Level 300 ng/mL) Final   POC Methadone UR 06/07/2022 None Detected  NONE DETECTED (Cut Off Level 300 ng/mL) Final   POC Oxycodone UR 06/07/2022 None Detected  NONE DETECTED (Cut Off Level 100 ng/mL) Final   POC Marijuana UR 06/07/2022 None Detected  NONE DETECTED (Cut Off Level 50 ng/mL) Final   Preg Test, Ur 06/08/2022 NEGATIVE  NEGATIVE Final   Comment:        THE SENSITIVITY OF THIS METHODOLOGY IS >20 mIU/mL. Performed at [facility], [address] [place], [place] [address]    Preg Test, Ur 06/08/2022 NEGATIVE  NEGATIVE Final   Comment:        THE SENSITIVITY OF THIS METHODOLOGY IS >24 mIU/mL     Blood Alcohol level:  Lab Results  Component Value Date   ETH <10 07/04/2022   ETH <10 02/07/2022    Metabolic Disorder Labs: Lab Results  Component Value Date   HGBA1C 5.7 (H) 10/15/2022   MPG 117 10/15/2022   MPG 96.8 06/07/2022   No results found for: "PROLACTIN" Lab Results  Component Value Date   CHOL 181 07/25/2022   TRIG 118 07/25/2022   HDL 45 07/25/2022   CHOLHDL 4.0 07/25/2022   VLDL 24 07/25/2022   LDLCALC 112 (H) 07/25/2022   LDLCALC 88 06/07/2022    Therapeutic Lab Levels: No results found for: "LITHIUM" No results found for: "VALPROATE" No results found for: "CBMZ"  Physical Findings   GAD-7    Flowsheet Row Office Visit from 12/17/2021 in [facility] for [facility] [place]  Total GAD-7 Score 17      PHQ2-9    Flowsheet Row ED from 10/07/2022 in [facility] ED from 06/07/2022 in [facility] Office Visit from 12/17/2021 in [facility] Total Score 0 1 2  PHQ-9 Total Score -- 2 8      Flowsheet Row ED from 10/07/2022 in [facility] Most recent reading at 10/27/2022  9:31 AM ED from 10/07/2022 in [facility] Emergency Department at [facility] Most recent reading at 10/07/2022 12:18 PM ED from 07/25/2022 in [facility] Most recent reading at 10/06/2022  9:38 AM  C-SSRS RISK CATEGORY No Risk No Risk No Risk        Musculoskeletal  Strength & Muscle Tone: within normal limits Gait & Station: normal Patient leans: N/A  Psychiatric Specialty Exam  Presentation  General Appearance:  Appropriate for Environment; Casual  Eye Contact: Good  Speech: Clear and Coherent; Normal Rate  Speech Volume: Normal  Handedness: Right   Mood and Affect  Mood: Euthymic  Affect: Congruent   Thought Process  Thought Processes: Coherent  Descriptions of Associations:Intact  Orientation:Full (Time, Place and Person)  Thought Content:Logical  Diagnosis of Schizophrenia or Schizoaffective disorder in past: Yes  Duration of Psychotic Symptoms: Greater than six months   Hallucinations:Hallucinations: None  Ideas of Reference:None  Suicidal Thoughts:Suicidal Thoughts: No  Homicidal Thoughts:Homicidal Thoughts: No   Sensorium  Memory: Immediate Good; Recent Good; Remote Good  Judgment: Intact  Insight: Present   Executive Functions  Concentration: Good  Attention Span: Good  Recall: Good  Fund of Knowledge: Good  Language: Good   Psychomotor Activity  Psychomotor Activity: Psychomotor Activity: Normal   Assets  Assets: Communication Skills; Desire for Improvement;  Leisure Time; Physical Health; Resilience   Sleep  Sleep: Sleep: Good   No data recorded  Physical Exam  Physical Exam Vitals and nursing note reviewed.  Constitutional:      General: She is not in acute distress.    Appearance: Normal appearance. She is not ill-appearing.  HENT:     Head: Normocephalic.  Eyes:     General:        Right eye:  No discharge.        Left eye: No discharge.     Conjunctiva/sclera: Conjunctivae normal.  Cardiovascular:     Rate and Rhythm: Normal rate.  Pulmonary:     Effort: Pulmonary effort is normal.  Musculoskeletal:        General: Normal range of motion.     Cervical back: Normal range of motion.  Skin:    Coloration: Skin is not jaundiced or pale.  Neurological:     Mental Status: She is alert and oriented to person, place, and time.  Psychiatric:        Attention and Perception: Attention and perception normal.        Mood and Affect: Mood and affect normal.        Speech: Speech normal.        Behavior: Behavior normal. Behavior is cooperative.        Thought Content: Thought content normal.        Cognition and Memory: Cognition normal.        Judgment: Judgment normal.    Review of Systems  Constitutional: Negative.   HENT: Negative.    Eyes: Negative.   Respiratory: Negative.    Cardiovascular: Negative.   Musculoskeletal: Negative.   Skin: Negative.   Neurological: Negative.   Psychiatric/Behavioral: Negative.     Blood pressure (!) 99/52, pulse 94, temperature 97.9 F (36.6 C), temperature source Oral, resp. rate 18, height 5\' 1"  (1.549 m), weight 198 lb (89.8 kg), SpO2 95 %. Body mass index is 37.41 kg/m.  Treatment Plan Summary:  Patient remains cleared by psychiatry.  TOC team continues to seek disposition.   Daily contact with patient to assess and evaluate symptoms and progress in treatment  Revonda Humphrey, NP 11/02/2022 5:42 PM

## 2022-11-02 NOTE — ED Notes (Signed)
Pt is in the bed sleeping. Respirations are even and unlabored. No acute distress noted. Will continue to monitor for safety. 

## 2022-11-02 NOTE — ED Notes (Signed)
Pt is sitting up in bed doing some activities. Respirations are even and unlabored. Not in any acute distress. Will continue to monitor for safety.

## 2022-11-03 DIAGNOSIS — N390 Urinary tract infection, site not specified: Secondary | ICD-10-CM | POA: Diagnosis not present

## 2022-11-03 DIAGNOSIS — E039 Hypothyroidism, unspecified: Secondary | ICD-10-CM | POA: Diagnosis not present

## 2022-11-03 DIAGNOSIS — E118 Type 2 diabetes mellitus with unspecified complications: Secondary | ICD-10-CM | POA: Diagnosis not present

## 2022-11-03 DIAGNOSIS — F209 Schizophrenia, unspecified: Secondary | ICD-10-CM | POA: Diagnosis not present

## 2022-11-03 DIAGNOSIS — F17200 Nicotine dependence, unspecified, uncomplicated: Secondary | ICD-10-CM | POA: Diagnosis not present

## 2022-11-03 DIAGNOSIS — Z1152 Encounter for screening for COVID-19: Secondary | ICD-10-CM | POA: Diagnosis not present

## 2022-11-03 DIAGNOSIS — F333 Major depressive disorder, recurrent, severe with psychotic symptoms: Secondary | ICD-10-CM | POA: Diagnosis not present

## 2022-11-03 DIAGNOSIS — F419 Anxiety disorder, unspecified: Secondary | ICD-10-CM | POA: Diagnosis not present

## 2022-11-03 DIAGNOSIS — N9489 Other specified conditions associated with female genital organs and menstrual cycle: Secondary | ICD-10-CM | POA: Diagnosis not present

## 2022-11-03 NOTE — ED Notes (Signed)
Pt calm and cooperative. No c/o pain or  distress. Pt laying down and is relaxing. Will continue to monitor for safety

## 2022-11-03 NOTE — ED Provider Notes (Signed)
Behavioral Health Progress Note  Date and Time: 11/03/2022 12:20 PM Name: Nicole [name] MRN:  [id number]  Nicole [name] is a 32 y.o. female, with PMH of SCZ unspecified, MDD with psychosis, who presented voluntary to [facility] (07/25/2022) via [facility] after a verbal altercation with [name] (not patient's legal guardian) for transient SI.  This is patient's fourth visit to [facility] for similar concerns this year. Patient has been dismissed from previous group home due to threatening SI and HI, then leaving the group home.  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  Subjective: Patient is observed sitting in her bed and coloring.  She is pleasant upon approach.  She is alert/oriented x 4, calm, cooperative, and attentive.  She smiles throughout the assessment.  She is denying any depression or anxiety.  She denies any concerns with appetite or sleep.  When asked if she is having any medical concerns.  She complains of ear pain.  Ears were examined and are WNL with no signs of infection.  Patient complains that she has had drainage from ears but there is no evidence of any type of drainage.  She continues to deny SI/HI/AVH.  Diagnosis:  Final diagnoses:  Tobacco use disorder  Schizophrenia, unspecified type (HCC)  MDD (major depressive disorder), recurrent episode, moderate (HCC)  Hypothyroidism, unspecified type    Total Time spent with patient: 20 minutes  Past Psychiatric History: see H&P Past Medical History:  Past Medical History:  Diagnosis Date   Anxiety    Depression    Hypothyroidism 08/07/2022   Tobacco use disorder 08/07/2022    Past Surgical History:  Procedure Laterality Date   WISDOM TOOTH EXTRACTION Bilateral 2020   Family History:  Family History  Problem Relation Age of Onset   Hypertension Father    Diabetes Father    Family Psychiatric  History: see H&P Social History:  Social History   Substance and Sexual Activity  Alcohol Use Never      Social History   Substance and Sexual Activity  Drug Use Never    Social History   Socioeconomic History   Marital status: Single    Spouse name: Not on file   Number of children: Not on file   Years of education: Not on file   Highest education level: Not on file  Occupational History   Not on file  Tobacco Use   Smoking status: Every Day    Types: Cigarettes   Smokeless tobacco: Never  Vaping Use   Vaping Use: Some days  Substance and Sexual Activity   Alcohol use: Never   Drug use: Never   Sexual activity: Yes    Partners: Female    Birth control/protection: Implant  Other Topics Concern   Not on file  Social History Narrative   Not on file   Social Determinants of Health   Financial Resource Strain: Not on file  Food Insecurity: Not on file  Transportation Needs: Not on file  Physical Activity: Not on file  Stress: Not on file  Social Connections: Not on file   SDOH:  SDOH Screenings   Depression (PHQ2-9): Low Risk  (10/28/2022)  Tobacco Use: High Risk (10/21/2022)   Additional Social History:                         Sleep: Good  Appetite:  Good  Current Medications:  Current Facility-Administered Medications  Medication Dose Route Frequency Provider Last Rate Last Admin  acetaminophen (TYLENOL) tablet 650 mg  650 mg Oral Q6H PRN Marlou Sa, NP   650 mg at 10/28/22 1054   alum & mag hydroxide-simeth (MAALOX/MYLANTA) 200-200-20 MG/5ML suspension 30 mL  30 mL Oral Q4H PRN Marlou Sa, NP   30 mL at 10/26/22 1755   ARIPiprazole ER (ABILIFY MAINTENA) 400 MG prefilled syringe 400 mg  400 mg Intramuscular Q28 days Princess Bruins, DO   400 mg at 10/25/22 0920   cetaphil lotion   Topical PRN Princess Bruins, DO       fluticasone [facility]) 50 MCG/ACT nasal spray 1 spray  1 spray Each Nare QHS Princess Bruins, DO   1 spray at 11/02/22 2136   hydrOXYzine (ATARAX) tablet 25 mg  25 mg Oral TID PRN Lorri Frederick, MD   25 mg at  11/02/22 2135   ketoconazole (NIZORAL) 2 % cream   Topical BID Princess Bruins, DO   Given at 11/03/22 1610   levothyroxine (SYNTHROID) tablet 100 mcg  100 mcg Oral Once Byungura, Veronique M, NP       levothyroxine (SYNTHROID) tablet 100 mcg  100 mcg Oral Q0600 Marlou Sa, NP   100 mcg at 11/03/22 0551   lidocaine (LIDODERM) 5 % 1 patch  1 patch Transdermal Q24H Princess Bruins, DO   1 patch at 11/03/22 9604   loratadine (CLARITIN) tablet 10 mg  10 mg Oral Daily Carrion-Carrero, Karle Starch, MD   10 mg at 11/03/22 0912   magnesium hydroxide (MILK OF MAGNESIA) suspension 30 mL  30 mL Oral Daily PRN Marlou Sa, NP   30 mL at 10/12/22 2145   metFORMIN (GLUCOPHAGE) tablet 500 mg  500 mg Oral Q breakfast Carrion-Carrero, Karle Starch, MD   500 mg at 11/03/22 5409   nicotine (NICODERM CQ - dosed in mg/24 hours) patch 14 mg  14 mg Transdermal Daily PRN Carrion-Carrero, Karle Starch, MD       ondansetron (ZOFRAN-ODT) disintegrating tablet 4 mg  4 mg Oral Q8H PRN Carrion-Carrero, Margely, MD   4 mg at 11/01/22 2246   Oxcarbazepine (TRILEPTAL) tablet 300 mg  300 mg Oral BID Byungura, Veronique M, NP   300 mg at 11/03/22 0911   pantoprazole (PROTONIX) EC tablet 40 mg  40 mg Oral Daily Carrion-Carrero, Karle Starch, MD   40 mg at 11/03/22 0911   polyethylene glycol (MIRALAX / GLYCOLAX) packet 17 g  17 g Oral Daily Princess Bruins, DO   17 g at 11/03/22 0911   QUEtiapine (SEROQUEL) tablet 400 mg  400 mg Oral BID Rayburn Go, Veronique M, NP   400 mg at 11/03/22 0912   sertraline (ZOLOFT) tablet 150 mg  150 mg Oral Daily Carrion-Carrero, Margely, MD   150 mg at 11/03/22 0912   sodium chloride (OCEAN) 0.65 % nasal spray 1 spray  1 spray Each Nare Daily Princess Bruins, DO   1 spray at 11/03/22 0911   traZODone (DESYREL) tablet 100 mg  100 mg Oral QHS Olin Pia M, NP   100 mg at 11/02/22 2135   traZODone (DESYREL) tablet 50 mg  50 mg Oral QHS PRN Marlou Sa, NP   50 mg at 11/02/22 2135   valACYclovir  (VALTREX) tablet 500 mg  500 mg Oral Daily Carrion-Carrero, Karle Starch, MD   500 mg at 11/03/22 0911   zinc oxide (BALMEX) 11.3 % cream   Topical PRN Princess Bruins, DO       Current Outpatient Medications  Medication Sig Dispense Refill   ABILIFY MAINTENA 400 MG  PRSY prefilled syringe 400 mg every 28 (twenty-eight) days.     cetirizine (ZYRTEC) 10 MG tablet Take 10 mg by mouth daily.     cyclobenzaprine (FLEXERIL) 10 MG tablet Take 1 tablet (10 mg total) by mouth 2 (two) times daily as needed for muscle spasms. 20 tablet 0   fluticasone (FLONASE) 50 MCG/ACT nasal spray Place 1 spray into both nostrils daily.     meloxicam (MOBIC) 15 MG tablet Take 15 mg by mouth daily.     nitrofurantoin, macrocrystal-monohydrate, (MACROBID) 100 MG capsule Take 1 capsule (100 mg total) by mouth 2 (two) times daily. 10 capsule 0   ondansetron (ZOFRAN-ODT) 4 MG disintegrating tablet Take 1 tablet (4 mg total) by mouth every 8 (eight) hours as needed for nausea or vomiting. 20 tablet 0   Oxcarbazepine (TRILEPTAL) 300 MG tablet Take 1 tablet (300 mg total) by mouth 2 (two) times daily. 60 tablet 0   QUEtiapine (SEROQUEL) 400 MG tablet Take 1 tablet (400 mg total) by mouth 2 (two) times daily. 60 tablet 0   sertraline (ZOLOFT) 50 MG tablet Take 3 tablets (150 mg total) by mouth in the morning. 90 tablet 0   traZODone (DESYREL) 100 MG tablet Take 1 tablet (100 mg total) by mouth at bedtime. 30 tablet 0   valACYclovir (VALTREX) 500 MG tablet Take 500 mg by mouth daily.      Labs  Lab Results:  Admission on 10/07/2022  Component Date Value Ref Range Status   Hgb A1c MFr Bld 10/15/2022 5.7 (H)  4.8 - 5.6 % Final   Comment: (NOTE)         Prediabetes: 5.7 - 6.4         Diabetes: >6.4         Glycemic control for adults with diabetes: <7.0    Mean Plasma Glucose 10/15/2022 117  mg/dL Final   Comment: (NOTE) Performed At: [facility] [address] [place], [place] [id number] Jolene Schimke MD  XQ:[id number]   Office Visit on 10/21/2022  Component Date Value Ref Range Status   SARSCOV2ONAVIRUS 2 AG 11/01/2022 NEGATIVE  NEGATIVE Final   Comment: (NOTE) SARS-CoV-2 antigen NOT DETECTED.   Negative results are presumptive.  Negative results do not preclude SARS-CoV-2 infection and should not be used as the sole basis for treatment or other patient management decisions, including infection  control decisions, particularly in the presence of clinical signs and  symptoms consistent with COVID-19, or in those who have been in contact with the virus.  Negative results must be combined with clinical observations, patient history, and epidemiological information. The expected result is Negative.  Fact Sheet for Patients: https://www.jennings-kim.com/  Fact Sheet for Healthcare Providers: https://alexander-rogers.biz/  This test is not yet approved or cleared by the [place] FDA and  has been authorized for detection and/or diagnosis of SARS-CoV-2 by FDA under an Emergency Use Authorization (EUA).  This EUA will remain in effect (meaning this test can be used) for the duration of  the COV                          ID-19 declaration under Section 564(b)(1) of the Act, 21 U.S.C. section 360bbb-3(b)(1), unless the authorization is terminated or revoked sooner.    Admission on 10/07/2022, Discharged on 10/07/2022  Component Date Value Ref Range Status   Color, Urine 10/07/2022 YELLOW  YELLOW Final   APPearance 10/07/2022 HAZY (A)  CLEAR Final   Specific Gravity,  Urine 10/07/2022 1.011  1.005 - 1.030 Final   pH 10/07/2022 7.0  5.0 - 8.0 Final   Glucose, UA 10/07/2022 NEGATIVE  NEGATIVE mg/dL Final   Hgb urine dipstick 10/07/2022 SMALL (A)  NEGATIVE Final   Bilirubin Urine 10/07/2022 NEGATIVE  NEGATIVE Final   Ketones, ur 10/07/2022 NEGATIVE  NEGATIVE mg/dL Final   Protein, ur 16/07/9603 NEGATIVE  NEGATIVE mg/dL Final   Nitrite 54/06/8118 NEGATIVE   NEGATIVE Final   Leukocytes,Ua 10/07/2022 LARGE (A)  NEGATIVE Final   RBC / HPF 10/07/2022 0-5  0 - 5 RBC/hpf Final   WBC, UA 10/07/2022 0-5  0 - 5 WBC/hpf Final   Bacteria, UA 10/07/2022 FEW (A)  NONE SEEN Final   Squamous Epithelial / HPF 10/07/2022 11-20  0 - 5 Final   Performed at [facility], [address], [place], [place] [address]   I-stat hCG, quantitative 10/07/2022 <5.0  <5 mIU/mL Final   Comment 3 10/07/2022          Final   Comment:   GEST. AGE      CONC.  (mIU/mL)   <=1 WEEK        5 - 50     2 WEEKS       50 - 500     3 WEEKS       100 - 10,000     4 WEEKS     1,000 - 30,000        FEMALE AND NON-PREGNANT FEMALE:     LESS THAN 5 mIU/mL    Sodium 10/07/2022 138  135 - 145 mmol/L Final   Potassium 10/07/2022 4.3  3.5 - 5.1 mmol/L Final   Chloride 10/07/2022 104  98 - 111 mmol/L Final   CO2 10/07/2022 28  22 - 32 mmol/L Final   Glucose, Bld 10/07/2022 90  70 - 99 mg/dL Final   Glucose reference range applies only to samples taken after fasting for at least 8 hours.   BUN 10/07/2022 8  6 - 20 mg/dL Final   Creatinine, Ser 10/07/2022 0.96  0.44 - 1.00 mg/dL Final   Calcium 95/62/1308 9.1  8.9 - 10.3 mg/dL Final   GFR, Estimated 10/07/2022 >60  >60 mL/min Final   Comment: (NOTE) Calculated using the CKD-EPI Creatinine Equation (2021)    Anion gap 10/07/2022 6  5 - 15 Final   Performed at [facility], [address] [place], [place] [address]   WBC 10/07/2022 5.8  4.0 - 10.5 K/uL Final   RBC 10/07/2022 4.36  3.87 - 5.11 MIL/uL Final   Hemoglobin 10/07/2022 12.9  12.0 - 15.0 g/dL Final   HCT 69/62/9528 40.0  36.0 - 46.0 % Final   MCV 10/07/2022 91.7  80.0 - 100.0 fL Final   MCH 10/07/2022 29.6  26.0 - 34.0 pg Final   MCHC 10/07/2022 32.3  30.0 - 36.0 g/dL Final   RDW 41/32/4401 12.4  11.5 - 15.5 % Final   Platelets 10/07/2022 295  150 - 400 K/uL Final   nRBC 10/07/2022 0.0  0.0 - 0.2 % Final   Performed at [facility], [address]  [place], [place] [address]   SARS Coronavirus 2 by RT PCR 10/07/2022 NEGATIVE  NEGATIVE Final   Comment: (NOTE) SARS-CoV-2 target nucleic acids are NOT DETECTED.  The SARS-CoV-2 RNA is generally detectable in upper respiratory specimens during the acute phase of infection. The lowest concentration of SARS-CoV-2 viral copies this assay can  detect is 138 copies/mL. A negative result does not preclude SARS-Cov-2 infection and should not be used as the sole basis for treatment or other patient management decisions. A negative result may occur with  improper specimen collection/handling, submission of specimen other than nasopharyngeal swab, presence of viral mutation(s) within the areas targeted by this assay, and inadequate number of viral copies(<138 copies/mL). A negative result must be combined with clinical observations, patient history, and epidemiological information. The expected result is Negative.  Fact Sheet for Patients:  EntrepreneurPulse.com.au  Fact Sheet for Healthcare Providers:  IncredibleEmployment.be  This test is no                          t yet approved or cleared by the [place] FDA and  has been authorized for detection and/or diagnosis of SARS-CoV-2 by FDA under an Emergency Use Authorization (EUA). This EUA will remain  in effect (meaning this test can be used) for the duration of the COVID-19 declaration under Section 564(b)(1) of the Act, 21 U.S.C.section 360bbb-3(b)(1), unless the authorization is terminated  or revoked sooner.       Influenza A by PCR 10/07/2022 NEGATIVE  NEGATIVE Final   Influenza B by PCR 10/07/2022 NEGATIVE  NEGATIVE Final   Comment: (NOTE) The Xpert Xpress SARS-CoV-2/FLU/RSV plus assay is intended as an aid in the diagnosis of influenza from Nasopharyngeal swab specimens and should not be used as a sole basis for treatment. Nasal washings and aspirates are unacceptable for Xpert Xpress  SARS-CoV-2/FLU/RSV testing.  Fact Sheet for Patients: EntrepreneurPulse.com.au  Fact Sheet for Healthcare Providers: IncredibleEmployment.be  This test is not yet approved or cleared by the [place] FDA and has been authorized for detection and/or diagnosis of SARS-CoV-2 by FDA under an Emergency Use Authorization (EUA). This EUA will remain in effect (meaning this test can be used) for the duration of the COVID-19 declaration under Section 564(b)(1) of the Act, 21 U.S.C. section 360bbb-3(b)(1), unless the authorization is terminated or revoked.     Resp Syncytial Virus by PCR 10/07/2022 NEGATIVE  NEGATIVE Final   Comment: (NOTE) Fact Sheet for Patients: EntrepreneurPulse.com.au  Fact Sheet for Healthcare Providers: IncredibleEmployment.be  This test is not yet approved or cleared by the [place] FDA and has been authorized for detection and/or diagnosis of SARS-CoV-2 by FDA under an Emergency Use Authorization (EUA). This EUA will remain in effect (meaning this test can be used) for the duration of the COVID-19 declaration under Section 564(b)(1) of the Act, 21 U.S.C. section 360bbb-3(b)(1), unless the authorization is terminated or revoked.  Performed at [facility], [place] [address], [place], [place] [address]    Specimen Description 10/07/2022 URINE, CLEAN CATCH   Final   Special Requests 10/07/2022    Final                   Value:NONE Performed at [facility], [place] [address] [place], [place] [address]    Culture 10/07/2022 MULTIPLE SPECIES PRESENT, SUGGEST RECOLLECTION (A)   Final   Report Status 10/07/2022 10/08/2022 FINAL   Final  Admission on 07/25/2022, Discharged on 10/07/2022  Component Date Value Ref Range Status   SARS Coronavirus 2 by RT PCR 07/25/2022 NEGATIVE  NEGATIVE Final   Comment: (NOTE) SARS-CoV-2 target nucleic acids are NOT DETECTED.  The SARS-CoV-2  RNA is generally detectable in upper respiratory specimens during the acute phase of infection. The lowest concentration of SARS-CoV-2 viral copies  this assay can detect is 138 copies/mL. A negative result does not preclude SARS-Cov-2 infection and should not be used as the sole basis for treatment or other patient management decisions. A negative result may occur with  improper specimen collection/handling, submission of specimen other than nasopharyngeal swab, presence of viral mutation(s) within the areas targeted by this assay, and inadequate number of viral copies(<138 copies/mL). A negative result must be combined with clinical observations, patient history, and epidemiological information. The expected result is Negative.  Fact Sheet for Patients:  BloggerCourse.com  Fact Sheet for Healthcare Providers:  SeriousBroker.it  This test is no                          t yet approved or cleared by the [place] FDA and  has been authorized for detection and/or diagnosis of SARS-CoV-2 by FDA under an Emergency Use Authorization (EUA). This EUA will remain  in effect (meaning this test can be used) for the duration of the COVID-19 declaration under Section 564(b)(1) of the Act, 21 U.S.C.section 360bbb-3(b)(1), unless the authorization is terminated  or revoked sooner.       Influenza A by PCR 07/25/2022 NEGATIVE  NEGATIVE Final   Influenza B by PCR 07/25/2022 NEGATIVE  NEGATIVE Final   Comment: (NOTE) The Xpert Xpress SARS-CoV-2/FLU/RSV plus assay is intended as an aid in the diagnosis of influenza from Nasopharyngeal swab specimens and should not be used as a sole basis for treatment. Nasal washings and aspirates are unacceptable for Xpert Xpress SARS-CoV-2/FLU/RSV testing.  Fact Sheet for Patients: BloggerCourse.com  Fact Sheet for Healthcare  Providers: SeriousBroker.it  This test is not yet approved or cleared by the [place] FDA and has been authorized for detection and/or diagnosis of SARS-CoV-2 by FDA under an Emergency Use Authorization (EUA). This EUA will remain in effect (meaning this test can be used) for the duration of the COVID-19 declaration under Section 564(b)(1) of the Act, 21 U.S.C. section 360bbb-3(b)(1), unless the authorization is terminated or revoked.  Performed at [facility], [address], [place], [place] [address]    WBC 07/25/2022 8.3  4.0 - 10.5 K/uL Final   RBC 07/25/2022 4.44  3.87 - 5.11 MIL/uL Final   Hemoglobin 07/25/2022 13.7  12.0 - 15.0 g/dL Final   HCT 60/45/4098 40.2  36.0 - 46.0 % Final   MCV 07/25/2022 90.5  80.0 - 100.0 fL Final   MCH 07/25/2022 30.9  26.0 - 34.0 pg Final   MCHC 07/25/2022 34.1  30.0 - 36.0 g/dL Final   RDW 11/91/4782 12.2  11.5 - 15.5 % Final   Platelets 07/25/2022 248  150 - 400 K/uL Final   nRBC 07/25/2022 0.0  0.0 - 0.2 % Final   Neutrophils Relative % 07/25/2022 43  % Final   Neutro Abs 07/25/2022 3.6  1.7 - 7.7 K/uL Final   Lymphocytes Relative 07/25/2022 52  % Final   Lymphs Abs 07/25/2022 4.2 (H)  0.7 - 4.0 K/uL Final   Monocytes Relative 07/25/2022 5  % Final   Monocytes Absolute 07/25/2022 0.4  0.1 - 1.0 K/uL Final   Eosinophils Relative 07/25/2022 0  % Final   Eosinophils Absolute 07/25/2022 0.0  0.0 - 0.5 K/uL Final   Basophils Relative 07/25/2022 0  % Final   Basophils Absolute 07/25/2022 0.0  0.0 - 0.1 K/uL Final   Immature Granulocytes 07/25/2022 0  % Final   Abs Immature Granulocytes 07/25/2022 0.02  0.00 - 0.07 K/uL Final   Performed at [facility], [address] [place], [place] [address]   Sodium 07/25/2022 138  135 - 145 mmol/L Final   Potassium 07/25/2022 4.0  3.5 - 5.1 mmol/L Final   Chloride 07/25/2022 104  98 - 111 mmol/L Final   CO2 07/25/2022 29  22 - 32 mmol/L Final   Glucose, Bld  07/25/2022 83  70 - 99 mg/dL Final   Glucose reference range applies only to samples taken after fasting for at least 8 hours.   BUN 07/25/2022 11  6 - 20 mg/dL Final   Creatinine, Ser 07/25/2022 0.97  0.44 - 1.00 mg/dL Final   Calcium 60/45/409810/09/2022 9.2  8.9 - 10.3 mg/dL Final   Total Protein 11/91/478210/09/2022 7.0  6.5 - 8.1 g/dL Final   Albumin 95/62/130810/09/2022 3.8  3.5 - 5.0 g/dL Final   AST 65/78/469610/09/2022 18  15 - 41 U/L Final   ALT 07/25/2022 22  0 - 44 U/L Final   Alkaline Phosphatase 07/25/2022 64  38 - 126 U/L Final   Total Bilirubin 07/25/2022 0.2 (L)  0.3 - 1.2 mg/dL Final   GFR, Estimated 07/25/2022 >60  >60 mL/min Final   Comment: (NOTE) Calculated using the CKD-EPI Creatinine Equation (2021)    Anion gap 07/25/2022 5  5 - 15 Final   Performed at [facility], [address] [place], [place] [address]   POC Amphetamine UR 07/25/2022 None Detected  NONE DETECTED (Cut Off Level 1000 ng/mL) Preliminary   POC Secobarbital (BAR) 07/25/2022 None Detected  NONE DETECTED (Cut Off Level 300 ng/mL) Preliminary   POC Buprenorphine (BUP) 07/25/2022 None Detected  NONE DETECTED (Cut Off Level 10 ng/mL) Preliminary   POC Oxazepam (BZO) 07/25/2022 None Detected  NONE DETECTED (Cut Off Level 300 ng/mL) Preliminary   POC Cocaine UR 07/25/2022 None Detected  NONE DETECTED (Cut Off Level 300 ng/mL) Preliminary   POC Methamphetamine UR 07/25/2022 None Detected  NONE DETECTED (Cut Off Level 1000 ng/mL) Preliminary   POC Morphine 07/25/2022 None Detected  NONE DETECTED (Cut Off Level 300 ng/mL) Preliminary   POC Methadone UR 07/25/2022 None Detected  NONE DETECTED (Cut Off Level 300 ng/mL) Preliminary   POC Oxycodone UR 07/25/2022 None Detected  NONE DETECTED (Cut Off Level 100 ng/mL) Preliminary   POC Marijuana UR 07/25/2022 None Detected  NONE DETECTED (Cut Off Level 50 ng/mL) Preliminary   SARSCOV2ONAVIRUS 2 AG 07/25/2022 NEGATIVE  NEGATIVE Final   Comment: (NOTE) SARS-CoV-2 antigen NOT DETECTED.    Negative results are presumptive.  Negative results do not preclude SARS-CoV-2 infection and should not be used as the sole basis for treatment or other patient management decisions, including infection  control decisions, particularly in the presence of clinical signs and  symptoms consistent with COVID-19, or in those who have been in contact with the virus.  Negative results must be combined with clinical observations, patient history, and epidemiological information. The expected result is Negative.  Fact Sheet for Patients: https://www.jennings-kim.com/https://www.fda.gov/media/141569/download  Fact Sheet for Healthcare Providers: https://alexander-rogers.biz/https://www.fda.gov/media/141568/download  This test is not yet approved or cleared by the [place] FDA and  has been authorized for detection and/or diagnosis of SARS-CoV-2 by FDA under an Emergency Use Authorization (EUA).  This EUA will remain in effect (meaning this test can be used) for the duration of  the COV  ID-19 declaration under Section 564(b)(1) of the Act, 21 U.S.C. section 360bbb-3(b)(1), unless the authorization is terminated or revoked sooner.     Cholesterol 07/25/2022 181  0 - 200 mg/dL Final   Triglycerides 09/81/191410/09/2022 118  <150 mg/dL Final   HDL 78/29/562110/09/2022 45  >40 mg/dL Final   Total CHOL/HDL Ratio 07/25/2022 4.0  RATIO Final   VLDL 07/25/2022 24  0 - 40 mg/dL Final   LDL Cholesterol 07/25/2022 112 (H)  0 - 99 mg/dL Final   Comment:        Total Cholesterol/HDL:CHD Risk Coronary Heart Disease Risk Table                     Men   Women  1/2 Average Risk   3.4   3.3  Average Risk       5.0   4.4  2 X Average Risk   9.6   7.1  3 X Average Risk  23.4   11.0        Use the calculated Patient Ratio above and the CHD Risk Table to determine the patient's CHD Risk.        ATP III CLASSIFICATION (LDL):  <100     mg/dL   Optimal  [id number]  mg/dL   Near or Above                    Optimal  130-159  mg/dL   Borderline  [phone]   mg/dL   High  >952>190     mg/dL   Very High Performed at [facility], [address] [place], [place] [address]    TSH 07/25/2022 6.668 (H)  0.350 - 4.500 uIU/mL Final   Comment: Performed by a 3rd Generation assay with a functional sensitivity of <=0.01 uIU/mL. Performed at [facility], [address] [place], [place] [address]    Glucose-Capillary 07/26/2022 104 (H)  70 - 99 mg/dL Final   Glucose reference range applies only to samples taken after fasting for at least 8 hours.   T3, Free 07/31/2022 2.3  2.0 - 4.4 pg/mL Final   Comment: (NOTE) Performed At: [facility] [place] [address] [place], [place] [id number] Jolene SchimkeNagendra Sanjai MD IH:[id number]:[phone]    Free T4 07/31/2022 0.60 (L)  0.61 - 1.12 ng/dL Final   Comment: (NOTE) Biotin ingestion may interfere with free T4 tests. If the results are inconsistent with the TSH level, previous test results, or the clinical presentation, then consider biotin interference. If needed, order repeat testing after stopping biotin. Performed at [facility], [address] [place], [place] [address]    Glucose-Capillary 08/29/2022 100 (H)  70 - 99 mg/dL Final   Glucose reference range applies only to samples taken after fasting for at least 8 hours.   TSH 09/05/2022 0.793  0.350 - 4.500 uIU/mL Final   Comment: Performed by a 3rd Generation assay with a functional sensitivity of <=0.01 uIU/mL. Performed at [facility], [address] [place], [place] [address]    Free T4 09/05/2022 0.73  0.61 - 1.12 ng/dL Final   Comment: (NOTE) Biotin ingestion may interfere with free T4 tests. If the results are inconsistent with the TSH level, previous test results, or the clinical presentation, then consider biotin interference. If needed, order repeat testing after stopping biotin. Performed at [facility], [address] [place], [place] [address]    Preg Test, Ur 09/06/2022 Negative  Negative  Final   Preg Test, Ur  09/06/2022 NEGATIVE  NEGATIVE Final   Comment:        THE SENSITIVITY OF THIS METHODOLOGY IS >24 mIU/mL    Preg Test, Ur 09/05/2022 NEGATIVE  NEGATIVE Final   Comment:        THE SENSITIVITY OF THIS METHODOLOGY IS >24 mIU/mL    Sodium 09/13/2022 136  135 - 145 mmol/L Final   Potassium 09/13/2022 4.5  3.5 - 5.1 mmol/L Final   Chloride 09/13/2022 105  98 - 111 mmol/L Final   CO2 09/13/2022 21 (L)  22 - 32 mmol/L Final   Glucose, Bld 09/13/2022 111 (H)  70 - 99 mg/dL Final   Glucose reference range applies only to samples taken after fasting for at least 8 hours.   BUN 09/13/2022 14  6 - 20 mg/dL Final   Creatinine, Ser 09/13/2022 0.92  0.44 - 1.00 mg/dL Final   Calcium 96/01/5408 9.1  8.9 - 10.3 mg/dL Final   GFR, Estimated 09/13/2022 >60  >60 mL/min Final   Comment: (NOTE) Calculated using the CKD-EPI Creatinine Equation (2021)    Anion gap 09/13/2022 10  5 - 15 Final   Performed at [facility], [address] [place], [place] [address]   Vitamin B-12 09/13/2022 585  180 - 914 pg/mL Final   Comment: (NOTE) This assay is not validated for testing neonatal or myeloproliferative syndrome specimens for Vitamin B12 levels. Performed at [facility], [address], [place], [place] [address]    Color, Urine 09/14/2022 YELLOW  YELLOW Final   APPearance 09/14/2022 HAZY (A)  CLEAR Final   Specific Gravity, Urine 09/14/2022 1.025  1.005 - 1.030 Final   pH 09/14/2022 5.0  5.0 - 8.0 Final   Glucose, UA 09/14/2022 NEGATIVE  NEGATIVE mg/dL Final   Hgb urine dipstick 09/14/2022 NEGATIVE  NEGATIVE Final   Bilirubin Urine 09/14/2022 NEGATIVE  NEGATIVE Final   Ketones, ur 09/14/2022 NEGATIVE  NEGATIVE mg/dL Final   Protein, ur 56/21/3086 NEGATIVE  NEGATIVE mg/dL Final   Nitrite 57/84/6962 NEGATIVE  NEGATIVE Final   Leukocytes,Ua 09/14/2022 TRACE (A)  NEGATIVE Final   RBC / HPF 09/14/2022 0-5  0 - 5 RBC/hpf Final   WBC, UA 09/14/2022 0-5  0 - 5 WBC/hpf Final   Bacteria, UA  09/14/2022 RARE (A)  NONE SEEN Final   Squamous Epithelial / HPF 09/14/2022 0-5  0 - 5 Final   Mucus 09/14/2022 PRESENT   Final   Performed at [facility], [address], [place], [place] [address]   Glucose-Capillary 09/16/2022 105 (H)  70 - 99 mg/dL Final   Glucose reference range applies only to samples taken after fasting for at least 8 hours.   SARS Coronavirus 2 by RT PCR 09/30/2022 NEGATIVE  NEGATIVE Final   Comment: (NOTE) SARS-CoV-2 target nucleic acids are NOT DETECTED.  The SARS-CoV-2 RNA is generally detectable in upper respiratory specimens during the acute phase of infection. The lowest concentration of SARS-CoV-2 viral copies this assay can detect is 138 copies/mL. A negative result does not preclude SARS-Cov-2 infection and should not be used as the sole basis for treatment or other patient management decisions. A negative result may occur with  improper specimen collection/handling, submission of specimen other than nasopharyngeal swab, presence of viral mutation(s) within the areas targeted by this assay, and inadequate number of viral copies(<138 copies/mL). A negative result must be combined with clinical observations, patient history, and epidemiological information. The  expected result is Negative.  Fact Sheet for Patients:  BloggerCourse.com  Fact Sheet for Healthcare Providers:  SeriousBroker.it  This test is no                          t yet approved or cleared by the [place] FDA and  has been authorized for detection and/or diagnosis of SARS-CoV-2 by FDA under an Emergency Use Authorization (EUA). This EUA will remain  in effect (meaning this test can be used) for the duration of the COVID-19 declaration under Section 564(b)(1) of the Act, 21 U.S.C.section 360bbb-3(b)(1), unless the authorization is terminated  or revoked sooner.       Influenza A by PCR 09/30/2022 NEGATIVE  NEGATIVE Final    Influenza B by PCR 09/30/2022 NEGATIVE  NEGATIVE Final   Comment: (NOTE) The Xpert Xpress SARS-CoV-2/FLU/RSV plus assay is intended as an aid in the diagnosis of influenza from Nasopharyngeal swab specimens and should not be used as a sole basis for treatment. Nasal washings and aspirates are unacceptable for Xpert Xpress SARS-CoV-2/FLU/RSV testing.  Fact Sheet for Patients: BloggerCourse.com  Fact Sheet for Healthcare Providers: SeriousBroker.it  This test is not yet approved or cleared by the [place] FDA and has been authorized for detection and/or diagnosis of SARS-CoV-2 by FDA under an Emergency Use Authorization (EUA). This EUA will remain in effect (meaning this test can be used) for the duration of the COVID-19 declaration under Section 564(b)(1) of the Act, 21 U.S.C. section 360bbb-3(b)(1), unless the authorization is terminated or revoked.     Resp Syncytial Virus by PCR 09/30/2022 NEGATIVE  NEGATIVE Final   Comment: (NOTE) Fact Sheet for Patients: BloggerCourse.com  Fact Sheet for Healthcare Providers: SeriousBroker.it  This test is not yet approved or cleared by the [place] FDA and has been authorized for detection and/or diagnosis of SARS-CoV-2 by FDA under an Emergency Use Authorization (EUA). This EUA will remain in effect (meaning this test can be used) for the duration of the COVID-19 declaration under Section 564(b)(1) of the Act, 21 U.S.C. section 360bbb-3(b)(1), unless the authorization is terminated or revoked.  Performed at [facility], [address] [place], [place] [address]    Sodium 10/01/2022 136  135 - 145 mmol/L Final   Potassium 10/01/2022 3.8  3.5 - 5.1 mmol/L Final   Chloride 10/01/2022 104  98 - 111 mmol/L Final   CO2 10/01/2022 27  22 - 32 mmol/L Final   Glucose, Bld 10/01/2022 98  70 - 99 mg/dL Final   Glucose  reference range applies only to samples taken after fasting for at least 8 hours.   BUN 10/01/2022 12  6 - 20 mg/dL Final   Creatinine, Ser 10/01/2022 0.98  0.44 - 1.00 mg/dL Final   Calcium 60/45/4098 8.9  8.9 - 10.3 mg/dL Final   GFR, Estimated 10/01/2022 >60  >60 mL/min Final   Comment: (NOTE) Calculated using the CKD-EPI Creatinine Equation (2021)    Anion gap 10/01/2022 5  5 - 15 Final   Performed at [facility], [address], [place], [place] [address]   WBC 10/01/2022 5.1  4.0 - 10.5 K/uL Final   RBC 10/01/2022 4.31  3.87 - 5.11 MIL/uL Final   Hemoglobin 10/01/2022 12.7  12.0 - 15.0 g/dL Final   HCT 78/29/5621 38.8  36.0 - 46.0 % Final   MCV 10/01/2022 90.0  80.0 - 100.0 fL Final   MCH 10/01/2022 29.5  26.0 - 34.0 pg Final   MCHC 10/01/2022 32.7  30.0 - 36.0 g/dL Final   RDW 10/06/4974 12.4  11.5 - 15.5 % Final   Platelets 10/01/2022 300  150 - 400 K/uL Final   nRBC 10/01/2022 0.0  0.0 - 0.2 % Final   Performed at [facility], [address], [place], [place] [address]   Lipase 10/01/2022 29  11 - 51 U/L Final   Performed at [facility], [address], [place], [place] [address]   Color, Urine 10/05/2022 YELLOW  YELLOW Final   APPearance 10/05/2022 HAZY (A)  CLEAR Final   Specific Gravity, Urine 10/05/2022 1.018  1.005 - 1.030 Final   pH 10/05/2022 5.0  5.0 - 8.0 Final   Glucose, UA 10/05/2022 NEGATIVE  NEGATIVE mg/dL Final   Hgb urine dipstick 10/05/2022 NEGATIVE  NEGATIVE Final   Bilirubin Urine 10/05/2022 NEGATIVE  NEGATIVE Final   Ketones, ur 10/05/2022 NEGATIVE  NEGATIVE mg/dL Final   Protein, ur 17/35/6701 NEGATIVE  NEGATIVE mg/dL Final   Nitrite 41/12/129 NEGATIVE  NEGATIVE Final   Leukocytes,Ua 10/05/2022 TRACE (A)  NEGATIVE Final   RBC / HPF 10/05/2022 0-5  0 - 5 RBC/hpf Final   WBC, UA 10/05/2022 0-5  0 - 5 WBC/hpf Final   Bacteria, UA 10/05/2022 RARE (A)  NONE SEEN Final   Squamous Epithelial / HPF 10/05/2022 0-5  0 - 5 Final   Mucus  10/05/2022 PRESENT   Final   Performed at [facility], [address], [place], [place] [address]   Specimen Description 10/05/2022 URINE, CLEAN CATCH   Final   Special Requests 10/05/2022    Final                   Value:NONE Performed at [facility], [address] [place], [place] [address]    Culture 10/05/2022 10,000 COLONIES/mL MULTIPLE SPECIES PRESENT, SUGGEST RECOLLECTION (A)   Final   Report Status 10/05/2022 10/07/2022 FINAL   Final  Admission on 07/04/2022, Discharged on 07/05/2022  Component Date Value Ref Range Status   Sodium 07/04/2022 142  135 - 145 mmol/L Final   Potassium 07/04/2022 4.4  3.5 - 5.1 mmol/L Final   Chloride 07/04/2022 109  98 - 111 mmol/L Final   CO2 07/04/2022 26  22 - 32 mmol/L Final   Glucose, Bld 07/04/2022 96  70 - 99 mg/dL Final   Glucose reference range applies only to samples taken after fasting for at least 8 hours.   BUN 07/04/2022 15  6 - 20 mg/dL Final   Creatinine, Ser 07/04/2022 0.83  0.44 - 1.00 mg/dL Final   Calcium 28/20/6015 9.3  8.9 - 10.3 mg/dL Final   Total Protein 61/53/7943 7.2  6.5 - 8.1 g/dL Final   Albumin 27/61/4709 3.9  3.5 - 5.0 g/dL Final   AST 29/57/4734 23  15 - 41 U/L Final   ALT 07/04/2022 28  0 - 44 U/L Final   Alkaline Phosphatase 07/04/2022 77  38 - 126 U/L Final   Total Bilirubin 07/04/2022 0.4  0.3 - 1.2 mg/dL Final   GFR, Estimated 07/04/2022 >60  >60 mL/min Final   Comment: (NOTE) Calculated using the CKD-EPI Creatinine Equation (2021)    Anion gap 07/04/2022 7  5 - 15 Final   Performed at [facility], [address], [place], [place] [address]   Alcohol, Ethyl (B) 07/04/2022 <10  <10 mg/dL Final   Comment: (NOTE)  Lowest detectable limit for serum alcohol is 10 mg/dL.  For medical purposes only. Performed at [facility], [address] [place], [place] [address]    WBC 07/04/2022 7.0  4.0 - 10.5 K/uL Final   RBC 07/04/2022 4.18  3.87 - 5.11 MIL/uL  Final   Hemoglobin 07/04/2022 12.8  12.0 - 15.0 g/dL Final   HCT 04/06/7627 39.1  36.0 - 46.0 % Final   MCV 07/04/2022 93.5  80.0 - 100.0 fL Final   MCH 07/04/2022 30.6  26.0 - 34.0 pg Final   MCHC 07/04/2022 32.7  30.0 - 36.0 g/dL Final   RDW 31/51/7616 12.9  11.5 - 15.5 % Final   Platelets 07/04/2022 243  150 - 400 K/uL Final   nRBC 07/04/2022 0.0  0.0 - 0.2 % Final   Neutrophils Relative % 07/04/2022 43  % Final   Neutro Abs 07/04/2022 3.0  1.7 - 7.7 K/uL Final   Lymphocytes Relative 07/04/2022 50  % Final   Lymphs Abs 07/04/2022 3.5  0.7 - 4.0 K/uL Final   Monocytes Relative 07/04/2022 7  % Final   Monocytes Absolute 07/04/2022 0.5  0.1 - 1.0 K/uL Final   Eosinophils Relative 07/04/2022 0  % Final   Eosinophils Absolute 07/04/2022 0.0  0.0 - 0.5 K/uL Final   Basophils Relative 07/04/2022 0  % Final   Basophils Absolute 07/04/2022 0.0  0.0 - 0.1 K/uL Final   Immature Granulocytes 07/04/2022 0  % Final   Abs Immature Granulocytes 07/04/2022 0.01  0.00 - 0.07 K/uL Final   Performed at [facility], [address], [place], [place] [address]   I-stat hCG, quantitative 07/04/2022 <5.0  <5 mIU/mL Final   Comment 3 07/04/2022          Final   Comment:   GEST. AGE      CONC.  (mIU/mL)   <=1 WEEK        5 - 50     2 WEEKS       50 - 500     3 WEEKS       100 - 10,000     4 WEEKS     1,000 - 30,000        FEMALE AND NON-PREGNANT FEMALE:     LESS THAN 5 mIU/mL   Admission on 06/14/2022, Discharged on 06/14/2022  Component Date Value Ref Range Status   Color, UA 06/14/2022 yellow  yellow Final   Clarity, UA 06/14/2022 cloudy (A)  clear Final   Glucose, UA 06/14/2022 negative  negative mg/dL Final   Bilirubin, UA 04/08/9484 negative  negative Final   Ketones, POC UA 06/14/2022 negative  negative mg/dL Final   Spec Grav, UA 46/27/0350 1.025  1.010 - 1.025 Final   Blood, UA 06/14/2022 negative  negative Final   pH, UA 06/14/2022 7.5  5.0 - 8.0 Final   Protein Ur, POC  06/14/2022 =30 (A)  negative mg/dL Final   Urobilinogen, UA 06/14/2022 0.2  0.2 or 1.0 E.U./dL Final   Nitrite, UA 09/38/1829 Negative  Negative Final   Leukocytes, UA 06/14/2022 Negative  Negative Final   Preg Test, Ur 06/14/2022 Negative  Negative Final   Specimen Description 06/14/2022 URINE, CLEAN CATCH   Final   Special Requests 06/14/2022    Final                   Value:NONE Performed at [facility], [address] [place], [place] [address]  Culture 06/14/2022 MULTIPLE SPECIES PRESENT, SUGGEST RECOLLECTION (A)   Final   Report Status 06/14/2022 06/16/2022 FINAL   Final  Admission on 06/07/2022, Discharged on 06/10/2022  Component Date Value Ref Range Status   SARS Coronavirus 2 by RT PCR 06/07/2022 NEGATIVE  NEGATIVE Final   Comment: (NOTE) SARS-CoV-2 target nucleic acids are NOT DETECTED.  The SARS-CoV-2 RNA is generally detectable in upper respiratory specimens during the acute phase of infection. The lowest concentration of SARS-CoV-2 viral copies this assay can detect is 138 copies/mL. A negative result does not preclude SARS-Cov-2 infection and should not be used as the sole basis for treatment or other patient management decisions. A negative result may occur with  improper specimen collection/handling, submission of specimen other than nasopharyngeal swab, presence of viral mutation(s) within the areas targeted by this assay, and inadequate number of viral copies(<138 copies/mL). A negative result must be combined with clinical observations, patient history, and epidemiological information. The expected result is Negative.  Fact Sheet for Patients:  BloggerCourse.com  Fact Sheet for Healthcare Providers:  SeriousBroker.it  This test is no                          t yet approved or cleared by the [place] FDA and  has been authorized for detection and/or diagnosis of SARS-CoV-2 by FDA under an  Emergency Use Authorization (EUA). This EUA will remain  in effect (meaning this test can be used) for the duration of the COVID-19 declaration under Section 564(b)(1) of the Act, 21 U.S.C.section 360bbb-3(b)(1), unless the authorization is terminated  or revoked sooner.       Influenza A by PCR 06/07/2022 NEGATIVE  NEGATIVE Final   Influenza B by PCR 06/07/2022 NEGATIVE  NEGATIVE Final   Comment: (NOTE) The Xpert Xpress SARS-CoV-2/FLU/RSV plus assay is intended as an aid in the diagnosis of influenza from Nasopharyngeal swab specimens and should not be used as a sole basis for treatment. Nasal washings and aspirates are unacceptable for Xpert Xpress SARS-CoV-2/FLU/RSV testing.  Fact Sheet for Patients: BloggerCourse.com  Fact Sheet for Healthcare Providers: SeriousBroker.it  This test is not yet approved or cleared by the [place] FDA and has been authorized for detection and/or diagnosis of SARS-CoV-2 by FDA under an Emergency Use Authorization (EUA). This EUA will remain in effect (meaning this test can be used) for the duration of the COVID-19 declaration under Section 564(b)(1) of the Act, 21 U.S.C. section 360bbb-3(b)(1), unless the authorization is terminated or revoked.  Performed at [facility], [address], [place], [place] [address]    WBC 06/07/2022 5.7  4.0 - 10.5 K/uL Final   RBC 06/07/2022 4.39  3.87 - 5.11 MIL/uL Final   Hemoglobin 06/07/2022 13.2  12.0 - 15.0 g/dL Final   HCT 60/45/4098 40.4  36.0 - 46.0 % Final   MCV 06/07/2022 92.0  80.0 - 100.0 fL Final   MCH 06/07/2022 30.1  26.0 - 34.0 pg Final   MCHC 06/07/2022 32.7  30.0 - 36.0 g/dL Final   RDW 11/91/4782 12.4  11.5 - 15.5 % Final   Platelets 06/07/2022 308  150 - 400 K/uL Final   nRBC 06/07/2022 0.0  0.0 - 0.2 % Final   Neutrophils Relative % 06/07/2022 42  % Final   Neutro Abs 06/07/2022 2.4  1.7 - 7.7 K/uL Final   Lymphocytes  Relative 06/07/2022 54  % Final   Lymphs Abs 06/07/2022 3.1  0.7 -  4.0 K/uL Final   Monocytes Relative 06/07/2022 4  % Final   Monocytes Absolute 06/07/2022 0.2  0.1 - 1.0 K/uL Final   Eosinophils Relative 06/07/2022 0  % Final   Eosinophils Absolute 06/07/2022 0.0  0.0 - 0.5 K/uL Final   Basophils Relative 06/07/2022 0  % Final   Basophils Absolute 06/07/2022 0.0  0.0 - 0.1 K/uL Final   Immature Granulocytes 06/07/2022 0  % Final   Abs Immature Granulocytes 06/07/2022 0.01  0.00 - 0.07 K/uL Final   Performed at [facility], [place] [address] [place], [place] [address]   Sodium 06/07/2022 139  135 - 145 mmol/L Final   Potassium 06/07/2022 4.0  3.5 - 5.1 mmol/L Final   Chloride 06/07/2022 104  98 - 111 mmol/L Final   CO2 06/07/2022 28  22 - 32 mmol/L Final   Glucose, Bld 06/07/2022 104 (H)  70 - 99 mg/dL Final   Glucose reference range applies only to samples taken after fasting for at least 8 hours.   BUN 06/07/2022 8  6 - 20 mg/dL Final   Creatinine, Ser 06/07/2022 0.84  0.44 - 1.00 mg/dL Final   Calcium 06/07/2022 9.1  8.9 - 10.3 mg/dL Final   Total Protein 06/07/2022 6.9  6.5 - 8.1 g/dL Final   Albumin 06/07/2022 3.7  3.5 - 5.0 g/dL Final   AST 06/07/2022 19  15 - 41 U/L Final   ALT 06/07/2022 22  0 - 44 U/L Final   Alkaline Phosphatase 06/07/2022 54  38 - 126 U/L Final   Total Bilirubin 06/07/2022 0.4  0.3 - 1.2 mg/dL Final   GFR, Estimated 06/07/2022 >60  >60 mL/min Final   Comment: (NOTE) Calculated using the CKD-EPI Creatinine Equation (2021)    Anion gap 06/07/2022 7  5 - 15 Final   Performed at [place] [address] [place], [place] [address]   Hgb A1c MFr Bld 06/07/2022 5.0  4.8 - 5.6 % Final   Comment: (NOTE) Pre diabetes:          5.7%-6.4%  Diabetes:              >6.4%  Glycemic control for   <7.0% adults with diabetes    Mean Plasma Glucose 06/07/2022 96.8  mg/dL Final   Performed at [facility], [place] [address], [place], [place]  [address]   TSH 06/07/2022 1.620  0.350 - 4.500 uIU/mL Final   Comment: Performed by a 3rd Generation assay with a functional sensitivity of <=0.01 uIU/mL. Performed at [facility], [place] [address] [place], [place] [address]    RPR Ser Ql 06/07/2022 NON REACTIVE  NON REACTIVE Final   Performed at [facility], [place] [address] [place], [place] [address]   Color, Urine 06/07/2022 YELLOW  YELLOW Final   APPearance 06/07/2022 HAZY (A)  CLEAR Final   Specific Gravity, Urine 06/07/2022 1.018  1.005 - 1.030 Final   pH 06/07/2022 7.0  5.0 - 8.0 Final   Glucose, UA 06/07/2022 NEGATIVE  NEGATIVE mg/dL Final   Hgb urine dipstick 06/07/2022 NEGATIVE  NEGATIVE Final   Bilirubin Urine 06/07/2022 NEGATIVE  NEGATIVE Final   Ketones, ur 06/07/2022 NEGATIVE  NEGATIVE mg/dL Final   Protein, ur 06/07/2022 NEGATIVE  NEGATIVE mg/dL Final   Nitrite 06/07/2022 NEGATIVE  NEGATIVE Final   Leukocytes,Ua 06/07/2022 NEGATIVE  NEGATIVE Final   Performed at [facility], [place] [address], [place], [place] [address]  Cholesterol 06/07/2022 164  0 - 200 mg/dL Final   Triglycerides 16/10/960408/25/2023 158 (H)  <150 mg/dL Final   HDL 54/09/811908/25/2023 44  >40 mg/dL Final   Total CHOL/HDL Ratio 06/07/2022 3.7  RATIO Final   VLDL 06/07/2022 32  0 - 40 mg/dL Final   LDL Cholesterol 06/07/2022 88  0 - 99 mg/dL Final   Comment:        Total Cholesterol/HDL:CHD Risk Coronary Heart Disease Risk Table                     Men   Women  1/2 Average Risk   3.4   3.3  Average Risk       5.0   4.4  2 X Average Risk   9.6   7.1  3 X Average Risk  23.4   11.0        Use the calculated Patient Ratio above and the CHD Risk Table to determine the patient's CHD Risk.        ATP III CLASSIFICATION (LDL):  <100     mg/dL   Optimal  [phone]  mg/dL   Near or Above                    Optimal  130-159  mg/dL   Borderline  [phone]  mg/dL   High  >865>190     mg/dL   Very High Performed at [facility], [address]  [place], [place] [address]    HIV Screen 4th Generation wRfx 06/07/2022 Non Reactive  Non Reactive Final   Performed at [facility], [address] [place], [place] [address]   SARSCOV2ONAVIRUS 2 AG 06/07/2022 NEGATIVE  NEGATIVE Final   Comment: (NOTE) SARS-CoV-2 antigen NOT DETECTED.   Negative results are presumptive.  Negative results do not preclude SARS-CoV-2 infection and should not be used as the sole basis for treatment or other patient management decisions, including infection  control decisions, particularly in the presence of clinical signs and  symptoms consistent with COVID-19, or in those who have been in contact with the virus.  Negative results must be combined with clinical observations, patient history, and epidemiological information. The expected result is Negative.  Fact Sheet for Patients: https://www.jennings-kim.com/https://www.fda.gov/media/141569/download  Fact Sheet for Healthcare Providers: https://alexander-rogers.biz/https://www.fda.gov/media/141568/download  This test is not yet approved or cleared by the [place] FDA and  has been authorized for detection and/or diagnosis of SARS-CoV-2 by FDA under an Emergency Use Authorization (EUA).  This EUA will remain in effect (meaning this test can be used) for the duration of  the COV                          ID-19 declaration under Section 564(b)(1) of the Act, 21 U.S.C. section 360bbb-3(b)(1), unless the authorization is terminated or revoked sooner.     POC Amphetamine UR 06/07/2022 None Detected  NONE DETECTED (Cut Off Level 1000 ng/mL) Final   POC Secobarbital (BAR) 06/07/2022 None Detected  NONE DETECTED (Cut Off Level 300 ng/mL) Final   POC Buprenorphine (BUP) 06/07/2022 None Detected  NONE DETECTED (Cut Off Level 10 ng/mL) Final   POC Oxazepam (BZO) 06/07/2022 None Detected  NONE DETECTED (Cut Off Level 300 ng/mL) Final   POC Cocaine UR 06/07/2022 None Detected  NONE DETECTED (Cut Off Level 300 ng/mL) Final   POC Methamphetamine UR 06/07/2022 None  Detected  NONE DETECTED (Cut Off Level 1000  ng/mL) Final   POC Morphine 06/07/2022 None Detected  NONE DETECTED (Cut Off Level 300 ng/mL) Final   POC Methadone UR 06/07/2022 None Detected  NONE DETECTED (Cut Off Level 300 ng/mL) Final   POC Oxycodone UR 06/07/2022 None Detected  NONE DETECTED (Cut Off Level 100 ng/mL) Final   POC Marijuana UR 06/07/2022 None Detected  NONE DETECTED (Cut Off Level 50 ng/mL) Final   Preg Test, Ur 06/08/2022 NEGATIVE  NEGATIVE Final   Comment:        THE SENSITIVITY OF THIS METHODOLOGY IS >20 mIU/mL. Performed at [facility], [address] [place], [place] [address]    Preg Test, Ur 06/08/2022 NEGATIVE  NEGATIVE Final   Comment:        THE SENSITIVITY OF THIS METHODOLOGY IS >24 mIU/mL     Blood Alcohol level:  Lab Results  Component Value Date   ETH <10 07/04/2022   ETH <10 02/07/2022    Metabolic Disorder Labs: Lab Results  Component Value Date   HGBA1C 5.7 (H) 10/15/2022   MPG 117 10/15/2022   MPG 96.8 06/07/2022   No results found for: "PROLACTIN" Lab Results  Component Value Date   CHOL 181 07/25/2022   TRIG 118 07/25/2022   HDL 45 07/25/2022   CHOLHDL 4.0 07/25/2022   VLDL 24 07/25/2022   LDLCALC 112 (H) 07/25/2022   LDLCALC 88 06/07/2022    Therapeutic Lab Levels: No results found for: "LITHIUM" No results found for: "VALPROATE" No results found for: "CBMZ"  Physical Findings   GAD-7    Flowsheet Row [facility]  Total GAD-7 Score 17      PHQ2-9    Flowsheet Row [facility]  PHQ-2 Total Score 0 1 2  PHQ-9 Total Score -- 2 8      Flowsheet Row [facility] Most recent reading at 11/03/2022  9:20 AM  [facility] Most recent reading at 10/07/2022 12:18 PM [facility] Most recent reading at 10/06/2022  9:38 AM  C-SSRS RISK CATEGORY No Risk No Risk No Risk        Musculoskeletal  Strength & Muscle Tone: within normal limits Gait & Station: normal Patient leans: N/A  Psychiatric Specialty Exam  Presentation  General Appearance:  Appropriate for Environment; Casual  Eye Contact: Good  Speech: Clear and Coherent; Normal Rate  Speech Volume: Normal  Handedness: Right   Mood and Affect  Mood: Euthymic  Affect: Congruent   Thought Process  Thought Processes: Coherent  Descriptions of Associations:Intact  Orientation:Full (Time, Place and Person)  Thought Content:Logical  Diagnosis of Schizophrenia or Schizoaffective disorder in past: Yes  Duration of Psychotic Symptoms: Greater than six months   Hallucinations:Hallucinations: None  Ideas of Reference:None  Suicidal Thoughts:Suicidal Thoughts: No  Homicidal Thoughts:Homicidal Thoughts: No   Sensorium  Memory: Immediate Good; Recent Good; Remote Good  Judgment: Intact  Insight: Present   Executive Functions  Concentration: Good  Attention Span: Good  Recall: Good  Fund of Knowledge: Good  Language: Good   Psychomotor Activity  Psychomotor Activity: Psychomotor Activity: Normal   Assets  Assets: Communication Skills; Desire for Improvement;  Physical Health; Leisure Time; Resilience   Sleep  Sleep: Sleep: Good   No data recorded  Physical Exam  Physical Exam Vitals and nursing note reviewed.  Constitutional:      General: She is not in acute distress.    Appearance: Normal appearance. She is not ill-appearing.  HENT:     Head: Normocephalic.     Right Ear: Tympanic membrane, ear canal and external ear normal. There is no impacted cerumen.     Left  Ear: Tympanic membrane, ear canal and external ear normal. There is no impacted cerumen.  Eyes:     General:        Right eye: No discharge.        Left eye: No discharge.     Conjunctiva/sclera: Conjunctivae normal.  Cardiovascular:     Rate and Rhythm: Normal rate.  Pulmonary:     Effort: Pulmonary effort is normal.  Musculoskeletal:        General: Normal range of motion.     Cervical back: Normal range of motion.  Skin:    General: Skin is warm and dry.  Neurological:     Mental Status: She is alert and oriented to person, place, and time.  Psychiatric:        Attention and Perception: Attention and perception normal.        Mood and Affect: Mood and affect normal.        Speech: Speech normal.        Behavior: Behavior normal. Behavior is cooperative.        Thought Content: Thought content normal.        Cognition and Memory: Cognition normal.        Judgment: Judgment normal.    Review of Systems  Constitutional: Negative.   HENT: Negative.    Eyes: Negative.   Respiratory: Negative.    Cardiovascular: Negative.   Musculoskeletal: Negative.   Skin: Negative.   Neurological: Negative.   Endo/Heme/Allergies: Negative.   Psychiatric/Behavioral: Negative.     Blood pressure 108/80, pulse 98, temperature 98.3 F (36.8 C), temperature source Tympanic, resp. rate 16, height 5\' 1"  (1.549 m), weight 198 lb (89.8 kg), SpO2 100 %. Body mass index is 37.41 kg/m.  Treatment Plan Summary: Daily contact with patient to assess and evaluate symptoms and progress in treatment and Medication management  Patient remains cleared by psychiatry.  TOC team continues to seek disposition.   Daily contact with patient to assess and evaluate symptoms and progress in treatment    Ardis Hughs, NP 11/03/2022 12:20 PM

## 2022-11-03 NOTE — ED Notes (Signed)
Patient alert and oriented x 3. Denies SI/HI/AVH. Denies intent or plan to harm self or others. Routine conducted according to faculty protocol. Encourage patient to notify staff with any needs or concerns. Patient verbalized agreement and understanding. Will continue to monitor for safety. 

## 2022-11-03 NOTE — ED Notes (Signed)
Patient is watching tv. Alert and oriented. Will continue to monitor for safety. 

## 2022-11-03 NOTE — ED Notes (Signed)
Patient  sleeping in no acute stress. RR even and unlabored .Environment secured .Will continue to monitor for safely. 

## 2022-11-03 NOTE — ED Notes (Signed)
.  Patient  sleeping in no acute stress. RR even and unlabored .Environment secured .Will continue to monitor for safely. 

## 2022-11-03 NOTE — ED Notes (Signed)
Pt is in the bed sleeping. Respirations are even and unlabored. No acute distress noted. Will continue to monitor for safety. 

## 2022-11-04 DIAGNOSIS — E039 Hypothyroidism, unspecified: Secondary | ICD-10-CM | POA: Diagnosis not present

## 2022-11-04 DIAGNOSIS — Z1152 Encounter for screening for COVID-19: Secondary | ICD-10-CM | POA: Diagnosis not present

## 2022-11-04 DIAGNOSIS — F419 Anxiety disorder, unspecified: Secondary | ICD-10-CM | POA: Diagnosis not present

## 2022-11-04 DIAGNOSIS — F333 Major depressive disorder, recurrent, severe with psychotic symptoms: Secondary | ICD-10-CM | POA: Diagnosis not present

## 2022-11-04 DIAGNOSIS — F17200 Nicotine dependence, unspecified, uncomplicated: Secondary | ICD-10-CM | POA: Diagnosis not present

## 2022-11-04 DIAGNOSIS — E118 Type 2 diabetes mellitus with unspecified complications: Secondary | ICD-10-CM | POA: Diagnosis not present

## 2022-11-04 DIAGNOSIS — N9489 Other specified conditions associated with female genital organs and menstrual cycle: Secondary | ICD-10-CM | POA: Diagnosis not present

## 2022-11-04 DIAGNOSIS — N390 Urinary tract infection, site not specified: Secondary | ICD-10-CM | POA: Diagnosis not present

## 2022-11-04 DIAGNOSIS — F209 Schizophrenia, unspecified: Secondary | ICD-10-CM | POA: Diagnosis not present

## 2022-11-04 NOTE — ED Notes (Signed)
Pt resting quietly, breathing is even and unlabored.  Pt denies SI, HI, pain and AVH.  Will continue to monitor for safety.  

## 2022-11-04 NOTE — ED Notes (Signed)
Pt requested GED RLA practice test.  PDF version found and provided to pt.

## 2022-11-04 NOTE — ED Notes (Signed)
Pt asleep at this hour. No apparent distress. RR even and unlabored. Monitored for safety.

## 2022-11-04 NOTE — ED Provider Notes (Addendum)
Behavioral Health Progress Note  Date and Time: 11/04/2022 9:44 AM Name: Nicole MassonLatasha Dooner MRN:  [id number]   Nicole [name] is a 32 y.o. female, with PMH of SCZ unspecified, MDD with psychosis, who presented voluntary to [facility] (07/25/20[facility]) via [facility] after a verbal altercation with Jeannett SeniorJosephine Okeke (not patient's legal guardian) for transient SI.  This is patient's fourth visit to [facility] for similar concerns this year. Patient has been dismissed from previous group home due to threatening SI and HI, then leaving the group home.  She has remained in the continuous assessment unit as a border  Subjective: Patient is sitting in her bed eating, she was pleasant and agreeable, she was smiling as she answered questions. She answered all questions appropriately. She knows the year , but forgot the reason why she came to the crisis center. She knows the name of the group home she came from and the reason why she went to the roup home "because I cannot live by myself".  She recalled she was diagnosed with schizophrenia when she was 46twenty-one years old and has been on monthly dose of Invega which she is adherent to. Her last dose was last month. Patient seems happy and  looking forward to  "good news" when she meets with her counselor and treatment team today. She denied being depressed, SI, HI and hallucinations.  Diagnosis:  Final diagnoses:  Tobacco use disorder  Schizophrenia, unspecified type (HCC)  MDD (major depressive disorder), recurrent episode, moderate (HCC)  Hypothyroidism, unspecified type    Total Time spent with patient: 20 minutes  Past Psychiatric History: Schizophrenia unspecified,                                              MDD (Major,  depressive disorder)                                              Anxiety, depression                                             Tobacco use  Past Medical History: Hypothyroidism Family History: Hypertension      Father                             Diabetes             Father Family Psychiatric  History: See H&P Social History: See H&P  Additional Social History:       Sleep: Good  Appetite:  Good  Current Medications:  Current Facility-Administered Medications  Medication Dose Route Frequency Provider Last Rate Last Admin   acetaminophen (TYLENOL) tablet 650 mg  650 mg Oral Q6H PRN Marlou SaByungura, Veronique M, NP   650 mg at 10/28/22 1054   alum & mag hydroxide-simeth (MAALOX/MYLANTA) 200-200-20 MG/5ML suspension 30 mL  30 mL Oral Q4H PRN Marlou SaByungura, Veronique M, NP   30 mL at 10/26/22 1755   ARIPiprazole ER (ABILIFY MAINTENA) 400 MG prefilled syringe 400 mg  400 mg Intramuscular Q28 days Princess BruinsNguyen, Julie, DO   400 mg at 10/25/22 0920   cetaphil  lotion   Topical PRN Princess Bruins, DO       fluticasone [facility]) 50 MCG/ACT nasal spray 1 spray  1 spray Each Nare QHS Princess Bruins, DO   1 spray at 11/03/22 2117   hydrOXYzine (ATARAX) tablet 25 mg  25 mg Oral TID PRN Lorri Frederick, MD   25 mg at 11/03/22 2117   ketoconazole (NIZORAL) 2 % cream   Topical BID Princess Bruins, DO   1 Application at 11/04/22 5597   levothyroxine (SYNTHROID) tablet 100 mcg  100 mcg Oral Once Byungura, Veronique M, NP       levothyroxine (SYNTHROID) tablet 100 mcg  100 mcg Oral Q0600 Marlou Sa, NP   100 mcg at 11/04/22 0642   lidocaine (LIDODERM) 5 % 1 patch  1 patch Transdermal Q24H Princess Bruins, DO   1 patch at 11/04/22 0914   loratadine (CLARITIN) tablet 10 mg  10 mg Oral Daily Carrion-Carrero, Karle Starch, MD   10 mg at 11/04/22 0915   magnesium hydroxide (MILK OF MAGNESIA) suspension 30 mL  30 mL Oral Daily PRN Marlou Sa, NP   30 mL at 10/12/22 2145   metFORMIN (GLUCOPHAGE) tablet 500 mg  500 mg Oral Q breakfast Carrion-Carrero, Karle Starch, MD   500 mg at 11/04/22 0915   nicotine (NICODERM CQ - dosed in mg/24 hours) patch 14 mg  14 mg Transdermal Daily PRN Carrion-Carrero, Karle Starch, MD       ondansetron (ZOFRAN-ODT) disintegrating tablet  4 mg  4 mg Oral Q8H PRN Carrion-Carrero, Margely, MD   4 mg at 11/01/22 2246   Oxcarbazepine (TRILEPTAL) tablet 300 mg  300 mg Oral BID Byungura, Veronique M, NP   300 mg at 11/04/22 0915   pantoprazole (PROTONIX) EC tablet 40 mg  40 mg Oral Daily Carrion-Carrero, Karle Starch, MD   40 mg at 11/04/22 0915   polyethylene glycol (MIRALAX / GLYCOLAX) packet 17 g  17 g Oral Daily Princess Bruins, DO   17 g at 11/04/22 0915   QUEtiapine (SEROQUEL) tablet 400 mg  400 mg Oral BID Rayburn Go, Veronique M, NP   400 mg at 11/04/22 0915   sertraline (ZOLOFT) tablet 150 mg  150 mg Oral Daily Carrion-Carrero, Karle Starch, MD   150 mg at 11/04/22 0916   sodium chloride (OCEAN) 0.65 % nasal spray 1 spray  1 spray Each Nare Daily Princess Bruins, DO   1 spray at 11/04/22 0916   traZODone (DESYREL) tablet 100 mg  100 mg Oral QHS Olin Pia M, NP   100 mg at 11/03/22 2117   traZODone (DESYREL) tablet 50 mg  50 mg Oral QHS PRN Marlou Sa, NP   50 mg at 11/03/22 2117   valACYclovir (VALTREX) tablet 500 mg  500 mg Oral Daily Carrion-Carrero, Karle Starch, MD   500 mg at 11/04/22 0915   zinc oxide (BALMEX) 11.3 % cream   Topical PRN Princess Bruins, DO       Current Outpatient Medications  Medication Sig Dispense Refill   ABILIFY MAINTENA 400 MG PRSY prefilled syringe 400 mg every 28 (twenty-eight) days.     cetirizine (ZYRTEC) 10 MG tablet Take 10 mg by mouth daily.     cyclobenzaprine (FLEXERIL) 10 MG tablet Take 1 tablet (10 mg total) by mouth 2 (two) times daily as needed for muscle spasms. 20 tablet 0   fluticasone (FLONASE) 50 MCG/ACT nasal spray Place 1 spray into both nostrils daily.     meloxicam (MOBIC) 15 MG tablet Take 15 mg by mouth  daily.     nitrofurantoin, macrocrystal-monohydrate, (MACROBID) 100 MG capsule Take 1 capsule (100 mg total) by mouth 2 (two) times daily. 10 capsule 0   ondansetron (ZOFRAN-ODT) 4 MG disintegrating tablet Take 1 tablet (4 mg total) by mouth every 8 (eight) hours as needed for  nausea or vomiting. 20 tablet 0   Oxcarbazepine (TRILEPTAL) 300 MG tablet Take 1 tablet (300 mg total) by mouth 2 (two) times daily. 60 tablet 0   QUEtiapine (SEROQUEL) 400 MG tablet Take 1 tablet (400 mg total) by mouth 2 (two) times daily. 60 tablet 0   sertraline (ZOLOFT) 50 MG tablet Take 3 tablets (150 mg total) by mouth in the morning. 90 tablet 0   traZODone (DESYREL) 100 MG tablet Take 1 tablet (100 mg total) by mouth at bedtime. 30 tablet 0   valACYclovir (VALTREX) 500 MG tablet Take 500 mg by mouth daily.      Labs  Lab Results:  Admission on 10/07/2022  Component Date Value Ref Range Status   Hgb A1c MFr Bld 10/15/2022 5.7 (H)  4.8 - 5.6 % Final   Comment: (NOTE)         Prediabetes: 5.7 - 6.4         Diabetes: >6.4         Glycemic control for adults with diabetes: <7.0    Mean Plasma Glucose 10/15/2022 117  mg/dL Final   Comment: (NOTE) Performed At: [facility] [address] [place], [place] [id number] Jolene SchimkeNagendra Sanjai MD JO:[id number]:[phone]   Office Visit on 10/21/2022  Component Date Value Ref Range Status   SARSCOV2ONAVIRUS 2 AG 11/01/2022 NEGATIVE  NEGATIVE Final   Comment: (NOTE) SARS-CoV-2 antigen NOT DETECTED.   Negative results are presumptive.  Negative results do not preclude SARS-CoV-2 infection and should not be used as the sole basis for treatment or other patient management decisions, including infection  control decisions, particularly in the presence of clinical signs and  symptoms consistent with COVID-19, or in those who have been in contact with the virus.  Negative results must be combined with clinical observations, patient history, and epidemiological information. The expected result is Negative.  Fact Sheet for Patients: https://www.jennings-kim.com/https://www.fda.gov/media/141569/download  Fact Sheet for Healthcare Providers: https://alexander-rogers.biz/https://www.fda.gov/media/141568/download  This test is not yet approved or cleared by the [place] FDA and  has been authorized  for detection and/or diagnosis of SARS-CoV-2 by FDA under an Emergency Use Authorization (EUA).  This EUA will remain in effect (meaning this test can be used) for the duration of  the COV                          ID-19 declaration under Section 564(b)(1) of the Act, 21 U.S.C. section 360bbb-3(b)(1), unless the authorization is terminated or revoked sooner.    Admission on 10/07/2022, Discharged on 10/07/2022  Component Date Value Ref Range Status   Color, Urine 10/07/2022 YELLOW  YELLOW Final   APPearance 10/07/2022 HAZY (A)  CLEAR Final   Specific Gravity, Urine 10/07/2022 1.011  1.005 - 1.030 Final   pH 10/07/2022 7.0  5.0 - 8.0 Final   Glucose, UA 10/07/2022 NEGATIVE  NEGATIVE mg/dL Final   Hgb urine dipstick 10/07/2022 SMALL (A)  NEGATIVE Final   Bilirubin Urine 10/07/2022 NEGATIVE  NEGATIVE Final   Ketones, ur 10/07/2022 NEGATIVE  NEGATIVE mg/dL Final   Protein, ur 60/10/932312/25/2023 NEGATIVE  NEGATIVE mg/dL Final   Nitrite 55/73/220212/25/2023 NEGATIVE  NEGATIVE Final   Leukocytes,Ua 10/07/2022 LARGE (  A)  NEGATIVE Final   RBC / HPF 10/07/2022 0-5  0 - 5 RBC/hpf Final   WBC, UA 10/07/2022 0-5  0 - 5 WBC/hpf Final   Bacteria, UA 10/07/2022 FEW (A)  NONE SEEN Final   Squamous Epithelial / HPF 10/07/2022 11-20  0 - 5 Final   Performed at [facility], [place] [address], [place], [place] [address]   I-stat hCG, quantitative 10/07/2022 <5.0  <5 mIU/mL Final   Comment 3 10/07/2022          Final   Comment:   GEST. AGE      CONC.  (mIU/mL)   <=1 WEEK        5 - 50     2 WEEKS       50 - 500     3 WEEKS       100 - 10,000     4 WEEKS     1,000 - 30,000        FEMALE AND NON-PREGNANT FEMALE:     LESS THAN 5 mIU/mL    Sodium 10/07/2022 138  135 - 145 mmol/L Final   Potassium 10/07/2022 4.3  3.5 - 5.1 mmol/L Final   Chloride 10/07/2022 104  98 - 111 mmol/L Final   CO2 10/07/2022 28  22 - 32 mmol/L Final   Glucose, Bld 10/07/2022 90  70 - 99 mg/dL Final   Glucose reference range applies only to  samples taken after fasting for at least 8 hours.   BUN 10/07/2022 8  6 - 20 mg/dL Final   Creatinine, Ser 10/07/2022 0.96  0.44 - 1.00 mg/dL Final   Calcium 10/07/2022 9.1  8.9 - 10.3 mg/dL Final   GFR, Estimated 10/07/2022 >60  >60 mL/min Final   Comment: (NOTE) Calculated using the CKD-EPI Creatinine Equation (2021)    Anion gap 10/07/2022 6  5 - 15 Final   Performed at [facility], [place] [address] [place], [place] [address]   WBC 10/07/2022 5.8  4.0 - 10.5 K/uL Final   RBC 10/07/2022 4.36  3.87 - 5.11 MIL/uL Final   Hemoglobin 10/07/2022 12.9  12.0 - 15.0 g/dL Final   HCT 10/07/2022 40.0  36.0 - 46.0 % Final   MCV 10/07/2022 91.7  80.0 - 100.0 fL Final   MCH 10/07/2022 29.6  26.0 - 34.0 pg Final   MCHC 10/07/2022 32.3  30.0 - 36.0 g/dL Final   RDW 10/07/2022 12.4  11.5 - 15.5 % Final   Platelets 10/07/2022 295  150 - 400 K/uL Final   nRBC 10/07/2022 0.0  0.0 - 0.2 % Final   Performed at [place] [address], [place],  [address]   SARS Coronavirus 2 by RT PCR 10/07/2022 NEGATIVE  NEGATIVE Final   Comment: (NOTE) SARS-CoV-2 target nucleic acids are NOT DETECTED.  The SARS-CoV-2 RNA is generally detectable in upper respiratory specimens during the acute phase of infection. The lowest concentration of SARS-CoV-2 viral copies this assay can detect is 138 copies/mL. A negative result does not preclude SARS-Cov-2 infection and should not be used as the sole basis for treatment or other patient management decisions. A negative result may occur with  improper specimen collection/handling, submission of specimen other than nasopharyngeal swab, presence of viral mutation(s) within the areas targeted by this assay, and inadequate number of viral copies(<138 copies/mL). A negative result must be combined with clinical observations, patient history, and epidemiological information. The expected result is Negative.  Fact Sheet for Patients:   BloggerCourse.comhttps://www.fda.gov/media/152166/download  Fact Sheet for Healthcare Providers:  SeriousBroker.ithttps://www.fda.gov/media/152162/download  This test is no                          t yet approved or cleared by the [place] FDA and  has been authorized for detection and/or diagnosis of SARS-CoV-2 by FDA under an Emergency Use Authorization (EUA). This EUA will remain  in effect (meaning this test can be used) for the duration of the COVID-19 declaration under Section 564(b)(1) of the Act, 21 U.S.C.section 360bbb-3(b)(1), unless the authorization is terminated  or revoked sooner.       Influenza A by PCR 10/07/2022 NEGATIVE  NEGATIVE Final   Influenza B by PCR 10/07/2022 NEGATIVE  NEGATIVE Final   Comment: (NOTE) The Xpert Xpress SARS-CoV-2/FLU/RSV plus assay is intended as an aid in the diagnosis of influenza from Nasopharyngeal swab specimens and should not be used as a sole basis for treatment. Nasal washings and aspirates are unacceptable for Xpert Xpress SARS-CoV-2/FLU/RSV testing.  Fact Sheet for Patients: BloggerCourse.comhttps://www.fda.gov/media/152166/download  Fact Sheet for Healthcare Providers: SeriousBroker.ithttps://www.fda.gov/media/152162/download  This test is not yet approved or cleared by the [place] FDA and has been authorized for detection and/or diagnosis of SARS-CoV-2 by FDA under an Emergency Use Authorization (EUA). This EUA will remain in effect (meaning this test can be used) for the duration of the COVID-19 declaration under Section 564(b)(1) of the Act, 21 U.S.C. section 360bbb-3(b)(1), unless the authorization is terminated or revoked.     Resp Syncytial Virus by PCR 10/07/2022 NEGATIVE  NEGATIVE Final   Comment: (NOTE) Fact Sheet for Patients: BloggerCourse.comhttps://www.fda.gov/media/152166/download  Fact Sheet for Healthcare Providers: SeriousBroker.ithttps://www.fda.gov/media/152162/download  This test is not yet approved or cleared by the [place] FDA and has been authorized for detection and/or  diagnosis of SARS-CoV-2 by FDA under an Emergency Use Authorization (EUA). This EUA will remain in effect (meaning this test can be used) for the duration of the COVID-19 declaration under Section 564(b)(1) of the Act, 21 U.S.C. section 360bbb-3(b)(1), unless the authorization is terminated or revoked.  Performed at [facility], [address] [place], [place] [address]    Specimen Description 10/07/2022 URINE, CLEAN CATCH   Final   Special Requests 10/07/2022    Final                   Value:NONE Performed at [facility], [address] [place], [place] [address]    Culture 10/07/2022 MULTIPLE SPECIES PRESENT, SUGGEST RECOLLECTION (A)   Final   Report Status 10/07/2022 10/08/2022 FINAL   Final  Admission on 07/25/2022, Discharged on 10/07/2022  Component Date Value Ref Range Status   SARS Coronavirus 2 by RT PCR 07/25/2022 NEGATIVE  NEGATIVE Final   Comment: (NOTE) SARS-CoV-2 target nucleic acids are NOT DETECTED.  The SARS-CoV-2 RNA is generally detectable in upper respiratory specimens during the acute phase of infection. The lowest concentration of SARS-CoV-2 viral copies this assay can detect is 138 copies/mL. A negative result does not preclude SARS-Cov-2 infection and should not be used as the sole basis for treatment or other patient management decisions. A negative result may occur with  improper specimen collection/handling, submission of specimen other than nasopharyngeal swab, presence of viral mutation(s) within the areas targeted by this assay, and inadequate number of viral copies(<138 copies/mL). A negative result must be combined with clinical observations, patient history, and epidemiological information. The expected result  is Negative.  Fact Sheet for Patients:  BloggerCourse.com  Fact Sheet for Healthcare Providers:  SeriousBroker.it  This test is no                          t yet approved  or cleared by the [place] FDA and  has been authorized for detection and/or diagnosis of SARS-CoV-2 by FDA under an Emergency Use Authorization (EUA). This EUA will remain  in effect (meaning this test can be used) for the duration of the COVID-19 declaration under Section 564(b)(1) of the Act, 21 U.S.C.section 360bbb-3(b)(1), unless the authorization is terminated  or revoked sooner.       Influenza A by PCR 07/25/2022 NEGATIVE  NEGATIVE Final   Influenza B by PCR 07/25/2022 NEGATIVE  NEGATIVE Final   Comment: (NOTE) The Xpert Xpress SARS-CoV-2/FLU/RSV plus assay is intended as an aid in the diagnosis of influenza from Nasopharyngeal swab specimens and should not be used as a sole basis for treatment. Nasal washings and aspirates are unacceptable for Xpert Xpress SARS-CoV-2/FLU/RSV testing.  Fact Sheet for Patients: BloggerCourse.com  Fact Sheet for Healthcare Providers: SeriousBroker.it  This test is not yet approved or cleared by the [place] FDA and has been authorized for detection and/or diagnosis of SARS-CoV-2 by FDA under an Emergency Use Authorization (EUA). This EUA will remain in effect (meaning this test can be used) for the duration of the COVID-19 declaration under Section 564(b)(1) of the Act, 21 U.S.C. section 360bbb-3(b)(1), unless the authorization is terminated or revoked.  Performed at [facility], [address] [place], [place] [address]    WBC 07/25/2022 8.3  4.0 - 10.5 K/uL Final   RBC 07/25/2022 4.44  3.87 - 5.11 MIL/uL Final   Hemoglobin 07/25/2022 13.7  12.0 - 15.0 g/dL Final   HCT 75/44/9201 40.2  36.0 - 46.0 % Final   MCV 07/25/2022 90.5  80.0 - 100.0 fL Final   MCH 07/25/2022 30.9  26.0 - 34.0 pg Final   MCHC 07/25/2022 34.1  30.0 - 36.0 g/dL Final   RDW 00/71/2197 12.2  11.5 - 15.5 % Final   Platelets 07/25/2022 248  150 - 400 K/uL Final   nRBC 07/25/2022 0.0  0.0 - 0.2 %  Final   Neutrophils Relative % 07/25/2022 43  % Final   Neutro Abs 07/25/2022 3.6  1.7 - 7.7 K/uL Final   Lymphocytes Relative 07/25/2022 52  % Final   Lymphs Abs 07/25/2022 4.2 (H)  0.7 - 4.0 K/uL Final   Monocytes Relative 07/25/2022 5  % Final   Monocytes Absolute 07/25/2022 0.4  0.1 - 1.0 K/uL Final   Eosinophils Relative 07/25/2022 0  % Final   Eosinophils Absolute 07/25/2022 0.0  0.0 - 0.5 K/uL Final   Basophils Relative 07/25/2022 0  % Final   Basophils Absolute 07/25/2022 0.0  0.0 - 0.1 K/uL Final   Immature Granulocytes 07/25/2022 0  % Final   Abs Immature Granulocytes 07/25/2022 0.02  0.00 - 0.07 K/uL Final   Performed at [facility], [address] [place], [place] [address]   Sodium 07/25/2022 138  135 - 145 mmol/L Final   Potassium 07/25/2022 4.0  3.5 - 5.1 mmol/L Final   Chloride 07/25/2022 104  98 - 111 mmol/L Final   CO2 07/25/2022 29  22 - 32 mmol/L Final   Glucose, Bld 07/25/2022 83  70 - 99 mg/dL Final   Glucose reference range applies  only to samples taken after fasting for at least 8 hours.   BUN 07/25/2022 11  6 - 20 mg/dL Final   Creatinine, Ser 07/25/2022 0.97  0.44 - 1.00 mg/dL Final   Calcium 16/07/9603 9.2  8.9 - 10.3 mg/dL Final   Total Protein 54/06/8118 7.0  6.5 - 8.1 g/dL Final   Albumin 14/78/2956 3.8  3.5 - 5.0 g/dL Final   AST 21/30/8657 18  15 - 41 U/L Final   ALT 07/25/2022 22  0 - 44 U/L Final   Alkaline Phosphatase 07/25/2022 64  38 - 126 U/L Final   Total Bilirubin 07/25/2022 0.2 (L)  0.3 - 1.2 mg/dL Final   GFR, Estimated 07/25/2022 >60  >60 mL/min Final   Comment: (NOTE) Calculated using the CKD-EPI Creatinine Equation (2021)    Anion gap 07/25/2022 5  5 - 15 Final   Performed at [facility], [address] [place], [place] [address]   POC Amphetamine UR 07/25/2022 None Detected  NONE DETECTED (Cut Off Level 1000 ng/mL) Preliminary   POC Secobarbital (BAR) 07/25/2022 None Detected  NONE DETECTED (Cut Off Level 300 ng/mL)  Preliminary   POC Buprenorphine (BUP) 07/25/2022 None Detected  NONE DETECTED (Cut Off Level 10 ng/mL) Preliminary   POC Oxazepam (BZO) 07/25/2022 None Detected  NONE DETECTED (Cut Off Level 300 ng/mL) Preliminary   POC Cocaine UR 07/25/2022 None Detected  NONE DETECTED (Cut Off Level 300 ng/mL) Preliminary   POC Methamphetamine UR 07/25/2022 None Detected  NONE DETECTED (Cut Off Level 1000 ng/mL) Preliminary   POC Morphine 07/25/2022 None Detected  NONE DETECTED (Cut Off Level 300 ng/mL) Preliminary   POC Methadone UR 07/25/2022 None Detected  NONE DETECTED (Cut Off Level 300 ng/mL) Preliminary   POC Oxycodone UR 07/25/2022 None Detected  NONE DETECTED (Cut Off Level 100 ng/mL) Preliminary   POC Marijuana UR 07/25/2022 None Detected  NONE DETECTED (Cut Off Level 50 ng/mL) Preliminary   SARSCOV2ONAVIRUS 2 AG 07/25/2022 NEGATIVE  NEGATIVE Final   Comment: (NOTE) SARS-CoV-2 antigen NOT DETECTED.   Negative results are presumptive.  Negative results do not preclude SARS-CoV-2 infection and should not be used as the sole basis for treatment or other patient management decisions, including infection  control decisions, particularly in the presence of clinical signs and  symptoms consistent with COVID-19, or in those who have been in contact with the virus.  Negative results must be combined with clinical observations, patient history, and epidemiological information. The expected result is Negative.  Fact Sheet for Patients: https://www.jennings-kim.com/  Fact Sheet for Healthcare Providers: https://alexander-rogers.biz/  This test is not yet approved or cleared by the [place] FDA and  has been authorized for detection and/or diagnosis of SARS-CoV-2 by FDA under an Emergency Use Authorization (EUA).  This EUA will remain in effect (meaning this test can be used) for the duration of  the COV                          ID-19 declaration under Section 564(b)(1) of  the Act, 21 U.S.C. section 360bbb-3(b)(1), unless the authorization is terminated or revoked sooner.     Cholesterol 07/25/2022 181  0 - 200 mg/dL Final   Triglycerides 29/52/8413 118  <150 mg/dL Final   HDL 24/40/1027 45  >40 mg/dL Final   Total CHOL/HDL Ratio 07/25/2022 4.0  RATIO Final   VLDL 07/25/2022 24  0 - 40 mg/dL Final   LDL Cholesterol 07/25/2022 112 (H)  0 - 99 mg/dL Final   Comment:        Total Cholesterol/HDL:CHD Risk Coronary Heart Disease Risk Table                     Men   Women  1/2 Average Risk   3.4   3.3  Average Risk       5.0   4.4  2 X Average Risk   9.6   7.1  3 X Average Risk  23.4   11.0        Use the calculated Patient Ratio above and the CHD Risk Table to determine the patient's CHD Risk.        ATP III CLASSIFICATION (LDL):  <100     mg/dL   Optimal  409-811  mg/dL   Near or Above                    Optimal  130-159  mg/dL   Borderline  914-782  mg/dL   High  >956     mg/dL   Very High Performed at [facility], [address], [place], [place] [address]    TSH 07/25/2022 6.668 (H)  0.350 - 4.500 uIU/mL Final   Comment: Performed by a 3rd Generation assay with a functional sensitivity of <=0.01 uIU/mL. Performed at [facility], [address], [place], [place] [address]    Glucose-Capillary 07/26/2022 104 (H)  70 - 99 mg/dL Final   Glucose reference range applies only to samples taken after fasting for at least 8 hours.   T3, Free 07/31/2022 2.3  2.0 - 4.4 pg/mL Final   Comment: (NOTE) Performed At: [facility] [address] [place], [place] [id number] Jolene Schimke MD XL:[id number]    Free T4 07/31/2022 0.60 (L)  0.61 - 1.12 ng/dL Final   Comment: (NOTE) Biotin ingestion may interfere with free T4 tests. If the results are inconsistent with the TSH level, previous test results, or the clinical presentation, then consider biotin interference. If needed, order repeat testing after stopping biotin. Performed at  [facility], [address] [place], [place] [address]    Glucose-Capillary 08/29/2022 100 (H)  70 - 99 mg/dL Final   Glucose reference range applies only to samples taken after fasting for at least 8 hours.   TSH 09/05/2022 0.793  0.350 - 4.500 uIU/mL Final   Comment: Performed by a 3rd Generation assay with a functional sensitivity of <=0.01 uIU/mL. Performed at [facility] [facility], [address] [place], [place] [address]    Free T4 09/05/2022 0.73  0.61 - 1.12 ng/dL Final   Comment: (NOTE) Biotin ingestion may interfere with free T4 tests. If the results are inconsistent with the TSH level, previous test results, or the clinical presentation, then consider biotin interference. If needed, order repeat testing after stopping biotin. Performed at [facility], [address], [place], [place] [address]    Preg Test, Ur 09/06/2022 Negative  Negative Final   Preg Test, Ur 09/06/2022 NEGATIVE  NEGATIVE Final   Comment:        THE SENSITIVITY OF THIS METHODOLOGY IS >24 mIU/mL    Preg Test, Ur 09/05/2022 NEGATIVE  NEGATIVE Final   Comment:        THE SENSITIVITY OF THIS METHODOLOGY IS >24 mIU/mL    Sodium 09/13/2022 136  135 - 145 mmol/L Final   Potassium 09/13/2022 4.5  3.5 - 5.1 mmol/L  Final   Chloride 09/13/2022 105  98 - 111 mmol/L Final   CO2 09/13/2022 21 (L)  22 - 32 mmol/L Final   Glucose, Bld 09/13/2022 111 (H)  70 - 99 mg/dL Final   Glucose reference range applies only to samples taken after fasting for at least 8 hours.   BUN 09/13/2022 14  6 - 20 mg/dL Final   Creatinine, Ser 09/13/2022 0.92  0.44 - 1.00 mg/dL Final   Calcium 16/07/9603 9.1  8.9 - 10.3 mg/dL Final   GFR, Estimated 09/13/2022 >60  >60 mL/min Final   Comment: (NOTE) Calculated using the CKD-EPI Creatinine Equation (2021)    Anion gap 09/13/2022 10  5 - 15 Final   Performed at [facility], [address], [place], [place] [address]   Vitamin B-12 09/13/2022 585  180 - 914 pg/mL  Final   Comment: (NOTE) This assay is not validated for testing neonatal or myeloproliferative syndrome specimens for Vitamin B12 levels. Performed at [facility], [address], [place], [place] [address]    Color, Urine 09/14/2022 YELLOW  YELLOW Final   APPearance 09/14/2022 HAZY (A)  CLEAR Final   Specific Gravity, Urine 09/14/2022 1.025  1.005 - 1.030 Final   pH 09/14/2022 5.0  5.0 - 8.0 Final   Glucose, UA 09/14/2022 NEGATIVE  NEGATIVE mg/dL Final   Hgb urine dipstick 09/14/2022 NEGATIVE  NEGATIVE Final   Bilirubin Urine 09/14/2022 NEGATIVE  NEGATIVE Final   Ketones, ur 09/14/2022 NEGATIVE  NEGATIVE mg/dL Final   Protein, ur 78/29/5621 NEGATIVE  NEGATIVE mg/dL Final   Nitrite 30/86/5784 NEGATIVE  NEGATIVE Final   Leukocytes,Ua 09/14/2022 TRACE (A)  NEGATIVE Final   RBC / HPF 09/14/2022 0-5  0 - 5 RBC/hpf Final   WBC, UA 09/14/2022 0-5  0 - 5 WBC/hpf Final   Bacteria, UA 09/14/2022 RARE (A)  NONE SEEN Final   Squamous Epithelial / HPF 09/14/2022 0-5  0 - 5 Final   Mucus 09/14/2022 PRESENT   Final   Performed at [facility], [address] [place], [place] [address]   Glucose-Capillary 09/16/2022 105 (H)  70 - 99 mg/dL Final   Glucose reference range applies only to samples taken after fasting for at least 8 hours.   SARS Coronavirus 2 by RT PCR 09/30/2022 NEGATIVE  NEGATIVE Final   Comment: (NOTE) SARS-CoV-2 target nucleic acids are NOT DETECTED.  The SARS-CoV-2 RNA is generally detectable in upper respiratory specimens during the acute phase of infection. The lowest concentration of SARS-CoV-2 viral copies this assay can detect is 138 copies/mL. A negative result does not preclude SARS-Cov-2 infection and should not be used as the sole basis for treatment or other patient management decisions. A negative result may occur with  improper specimen collection/handling, submission of specimen other than nasopharyngeal swab, presence of viral mutation(s) within  the areas targeted by this assay, and inadequate number of viral copies(<138 copies/mL). A negative result must be combined with clinical observations, patient history, and epidemiological information. The expected result is Negative.  Fact Sheet for Patients:  BloggerCourse.com  Fact Sheet for Healthcare Providers:  SeriousBroker.it  This test is no                          t yet approved or cleared by the [place] FDA and  has been authorized for detection and/or diagnosis of SARS-CoV-2 by FDA under an Emergency Use Authorization (EUA). This EUA  will remain  in effect (meaning this test can be used) for the duration of the COVID-19 declaration under Section 564(b)(1) of the Act, 21 U.S.C.section 360bbb-3(b)(1), unless the authorization is terminated  or revoked sooner.       Influenza A by PCR 09/30/2022 NEGATIVE  NEGATIVE Final   Influenza B by PCR 09/30/2022 NEGATIVE  NEGATIVE Final   Comment: (NOTE) The Xpert Xpress SARS-CoV-2/FLU/RSV plus assay is intended as an aid in the diagnosis of influenza from Nasopharyngeal swab specimens and should not be used as a sole basis for treatment. Nasal washings and aspirates are unacceptable for Xpert Xpress SARS-CoV-2/FLU/RSV testing.  Fact Sheet for Patients: BloggerCourse.com  Fact Sheet for Healthcare Providers: SeriousBroker.it  This test is not yet approved or cleared by the [place] FDA and has been authorized for detection and/or diagnosis of SARS-CoV-2 by FDA under an Emergency Use Authorization (EUA). This EUA will remain in effect (meaning this test can be used) for the duration of the COVID-19 declaration under Section 564(b)(1) of the Act, 21 U.S.C. section 360bbb-3(b)(1), unless the authorization is terminated or revoked.     Resp Syncytial Virus by PCR 09/30/2022 NEGATIVE  NEGATIVE Final   Comment:  (NOTE) Fact Sheet for Patients: BloggerCourse.com  Fact Sheet for Healthcare Providers: SeriousBroker.it  This test is not yet approved or cleared by the [place] FDA and has been authorized for detection and/or diagnosis of SARS-CoV-2 by FDA under an Emergency Use Authorization (EUA). This EUA will remain in effect (meaning this test can be used) for the duration of the COVID-19 declaration under Section 564(b)(1) of the Act, 21 U.S.C. section 360bbb-3(b)(1), unless the authorization is terminated or revoked.  Performed at [facility], [address] [place], [place] [address]    Sodium 10/01/2022 136  135 - 145 mmol/L Final   Potassium 10/01/2022 3.8  3.5 - 5.1 mmol/L Final   Chloride 10/01/2022 104  98 - 111 mmol/L Final   CO2 10/01/2022 27  22 - 32 mmol/L Final   Glucose, Bld 10/01/2022 98  70 - 99 mg/dL Final   Glucose reference range applies only to samples taken after fasting for at least 8 hours.   BUN 10/01/2022 12  6 - 20 mg/dL Final   Creatinine, Ser 10/01/2022 0.98  0.44 - 1.00 mg/dL Final   Calcium 04/06/7627 8.9  8.9 - 10.3 mg/dL Final   GFR, Estimated 10/01/2022 >60  >60 mL/min Final   Comment: (NOTE) Calculated using the CKD-EPI Creatinine Equation (2021)    Anion gap 10/01/2022 5  5 - 15 Final   Performed at [facility], [address] [place], [place] [address]   WBC 10/01/2022 5.1  4.0 - 10.5 K/uL Final   RBC 10/01/2022 4.31  3.87 - 5.11 MIL/uL Final   Hemoglobin 10/01/2022 12.7  12.0 - 15.0 g/dL Final   HCT 61/60/7371 38.8  36.0 - 46.0 % Final   MCV 10/01/2022 90.0  80.0 - 100.0 fL Final   MCH 10/01/2022 29.5  26.0 - 34.0 pg Final   MCHC 10/01/2022 32.7  30.0 - 36.0 g/dL Final   RDW 04/08/9484 12.4  11.5 - 15.5 % Final   Platelets 10/01/2022 300  150 - 400 K/uL Final   nRBC 10/01/2022 0.0  0.0 - 0.2 % Final   Performed at [facility], [address], [place], [place] [address]    Lipase 10/01/2022 29  11 - 51 U/L Final   Performed at [facility], [address], [place], [place] [address]   Color, Urine 10/05/2022 YELLOW  YELLOW Final   APPearance 10/05/2022 HAZY (A)  CLEAR Final   Specific Gravity, Urine 10/05/2022 1.018  1.005 - 1.030 Final   pH 10/05/2022 5.0  5.0 - 8.0 Final   Glucose, UA 10/05/2022 NEGATIVE  NEGATIVE mg/dL Final   Hgb urine dipstick 10/05/2022 NEGATIVE  NEGATIVE Final   Bilirubin Urine 10/05/2022 NEGATIVE  NEGATIVE Final   Ketones, ur 10/05/2022 NEGATIVE  NEGATIVE mg/dL Final   Protein, ur 60/45/4098 NEGATIVE  NEGATIVE mg/dL Final   Nitrite 11/91/4782 NEGATIVE  NEGATIVE Final   Leukocytes,Ua 10/05/2022 TRACE (A)  NEGATIVE Final   RBC / HPF 10/05/2022 0-5  0 - 5 RBC/hpf Final   WBC, UA 10/05/2022 0-5  0 - 5 WBC/hpf Final   Bacteria, UA 10/05/2022 RARE (A)  NONE SEEN Final   Squamous Epithelial / HPF 10/05/2022 0-5  0 - 5 Final   Mucus 10/05/2022 PRESENT   Final   Performed at [facility], [address], [place], [place] [address]   Specimen Description 10/05/2022 URINE, CLEAN CATCH   Final   Special Requests 10/05/2022    Final                   Value:NONE Performed at [facility], [address], [place], [place] [address]    Culture 10/05/2022 10,000 COLONIES/mL MULTIPLE SPECIES PRESENT, SUGGEST RECOLLECTION (A)   Final   Report Status 10/05/2022 10/07/2022 FINAL   Final  Admission on 07/04/2022, Discharged on 07/05/2022  Component Date Value Ref Range Status   Sodium 07/04/2022 142  135 - 145 mmol/L Final   Potassium 07/04/2022 4.4  3.5 - 5.1 mmol/L Final   Chloride 07/04/2022 109  98 - 111 mmol/L Final   CO2 07/04/2022 26  22 - 32 mmol/L Final   Glucose, Bld 07/04/2022 96  70 - 99 mg/dL Final   Glucose reference range applies only to samples taken after fasting for at least 8 hours.   BUN 07/04/2022 15  6 - 20 mg/dL Final   Creatinine, Ser 07/04/2022 0.83  0.44 - 1.00 mg/dL Final   Calcium 78/46/9629 9.3   8.9 - 10.3 mg/dL Final   Total Protein 52/84/1324 7.2  6.5 - 8.1 g/dL Final   Albumin 40/07/2724 3.9  3.5 - 5.0 g/dL Final   AST 36/64/4034 23  15 - 41 U/L Final   ALT 07/04/2022 28  0 - 44 U/L Final   Alkaline Phosphatase 07/04/2022 77  38 - 126 U/L Final   Total Bilirubin 07/04/2022 0.4  0.3 - 1.2 mg/dL Final   GFR, Estimated 07/04/2022 >60  >60 mL/min Final   Comment: (NOTE) Calculated using the CKD-EPI Creatinine Equation (2021)    Anion gap 07/04/2022 7  5 - 15 Final   Performed at [facility], [address] [place], [place] [address]   Alcohol, Ethyl (B) 07/04/2022 <10  <10 mg/dL Final   Comment: (NOTE) Lowest detectable limit for serum alcohol is 10 mg/dL.  For medical purposes only. Performed at [facility], [address], [place], [place] [address]    WBC 07/04/2022 7.0  4.0 - 10.5 K/uL Final   RBC 07/04/2022 4.18  3.87 - 5.11 MIL/uL Final   Hemoglobin 07/04/2022 12.8  12.0 - 15.0 g/dL Final   HCT 56/43/3295 39.1  36.0 - 46.0 % Final   MCV 07/04/2022 93.5  80.0 -  100.0 fL Final   MCH 07/04/2022 30.6  26.0 - 34.0 pg Final   MCHC 07/04/2022 32.7  30.0 - 36.0 g/dL Final   RDW 16/07/9603 12.9  11.5 - 15.5 % Final   Platelets 07/04/2022 243  150 - 400 K/uL Final   nRBC 07/04/2022 0.0  0.0 - 0.2 % Final   Neutrophils Relative % 07/04/2022 43  % Final   Neutro Abs 07/04/2022 3.0  1.7 - 7.7 K/uL Final   Lymphocytes Relative 07/04/2022 50  % Final   Lymphs Abs 07/04/2022 3.5  0.7 - 4.0 K/uL Final   Monocytes Relative 07/04/2022 7  % Final   Monocytes Absolute 07/04/2022 0.5  0.1 - 1.0 K/uL Final   Eosinophils Relative 07/04/2022 0  % Final   Eosinophils Absolute 07/04/2022 0.0  0.0 - 0.5 K/uL Final   Basophils Relative 07/04/2022 0  % Final   Basophils Absolute 07/04/2022 0.0  0.0 - 0.1 K/uL Final   Immature Granulocytes 07/04/2022 0  % Final   Abs Immature Granulocytes 07/04/2022 0.01  0.00 - 0.07 K/uL Final   Performed at [facility], [address] [place], [place] [address]   I-stat hCG, quantitative 07/04/2022 <5.0  <5 mIU/mL Final   Comment 3 07/04/2022          Final   Comment:   GEST. AGE      CONC.  (mIU/mL)   <=1 WEEK        5 - 50     2 WEEKS       50 - 500     3 WEEKS       100 - 10,000     4 WEEKS     1,000 - 30,000        FEMALE AND NON-PREGNANT FEMALE:     LESS THAN 5 mIU/mL   Admission on 06/14/2022, Discharged on 06/14/2022  Component Date Value Ref Range Status   Color, UA 06/14/2022 yellow  yellow Final   Clarity, UA 06/14/2022 cloudy (A)  clear Final   Glucose, UA 06/14/2022 negative  negative mg/dL Final   Bilirubin, UA 11/91/4782 negative  negative Final   Ketones, POC UA 06/14/2022 negative  negative mg/dL Final   Spec Grav, UA 95/62/1308 1.025  1.010 - 1.025 Final   Blood, UA 06/14/2022 negative  negative Final   pH, UA 06/14/2022 7.5  5.0 - 8.0 Final   Protein Ur, POC 06/14/2022 =30 (A)  negative mg/dL Final   Urobilinogen, UA 06/14/2022 0.2  0.2 or 1.0 E.U./dL Final   Nitrite, UA 65/78/4696 Negative  Negative Final   Leukocytes, UA 06/14/2022 Negative  Negative Final   Preg Test, Ur 06/14/2022 Negative  Negative Final   Specimen Description 06/14/2022 URINE, CLEAN CATCH   Final   Special Requests 06/14/2022    Final                   Value:NONE Performed at [facility], [address], [place], [place] [address]    Culture 06/14/2022 MULTIPLE SPECIES PRESENT, SUGGEST RECOLLECTION (A)   Final   Report Status 06/14/2022 06/16/2022 FINAL   Final  Admission on 06/07/2022, Discharged on 06/10/2022  Component Date Value Ref Range Status   SARS Coronavirus 2 by RT PCR 06/07/2022 NEGATIVE  NEGATIVE Final   Comment: (NOTE) SARS-CoV-2 target nucleic acids are NOT DETECTED.  The SARS-CoV-2 RNA is generally detectable in upper respiratory specimens during the acute phase of infection. The lowest  concentration of SARS-CoV-2 viral copies this assay can detect  is 138 copies/mL. A negative result does not preclude SARS-Cov-2 infection and should not be used as the sole basis for treatment or other patient management decisions. A negative result may occur with  improper specimen collection/handling, submission of specimen other than nasopharyngeal swab, presence of viral mutation(s) within the areas targeted by this assay, and inadequate number of viral copies(<138 copies/mL). A negative result must be combined with clinical observations, patient history, and epidemiological information. The expected result is Negative.  Fact Sheet for Patients:  BloggerCourse.com  Fact Sheet for Healthcare Providers:  SeriousBroker.it  This test is no                          t yet approved or cleared by the [place] FDA and  has been authorized for detection and/or diagnosis of SARS-CoV-2 by FDA under an Emergency Use Authorization (EUA). This EUA will remain  in effect (meaning this test can be used) for the duration of the COVID-19 declaration under Section 564(b)(1) of the Act, 21 U.S.C.section 360bbb-3(b)(1), unless the authorization is terminated  or revoked sooner.       Influenza A by PCR 06/07/2022 NEGATIVE  NEGATIVE Final   Influenza B by PCR 06/07/2022 NEGATIVE  NEGATIVE Final   Comment: (NOTE) The Xpert Xpress SARS-CoV-2/FLU/RSV plus assay is intended as an aid in the diagnosis of influenza from Nasopharyngeal swab specimens and should not be used as a sole basis for treatment. Nasal washings and aspirates are unacceptable for Xpert Xpress SARS-CoV-2/FLU/RSV testing.  Fact Sheet for Patients: BloggerCourse.com  Fact Sheet for Healthcare Providers: SeriousBroker.it  This test is not yet approved or cleared by the [place] FDA and has been authorized for detection and/or diagnosis of SARS-CoV-2 by FDA under an Emergency Use  Authorization (EUA). This EUA will remain in effect (meaning this test can be used) for the duration of the COVID-19 declaration under Section 564(b)(1) of the Act, 21 U.S.C. section 360bbb-3(b)(1), unless the authorization is terminated or revoked.  Performed at [facility], [address], [place], [place] [address]    WBC 06/07/2022 5.7  4.0 - 10.5 K/uL Final   RBC 06/07/2022 4.39  3.87 - 5.11 MIL/uL Final   Hemoglobin 06/07/2022 13.2  12.0 - 15.0 g/dL Final   HCT 91/47/8295 40.4  36.0 - 46.0 % Final   MCV 06/07/2022 92.0  80.0 - 100.0 fL Final   MCH 06/07/2022 30.1  26.0 - 34.0 pg Final   MCHC 06/07/2022 32.7  30.0 - 36.0 g/dL Final   RDW 62/13/0865 12.4  11.5 - 15.5 % Final   Platelets 06/07/2022 308  150 - 400 K/uL Final   nRBC 06/07/2022 0.0  0.0 - 0.2 % Final   Neutrophils Relative % 06/07/2022 42  % Final   Neutro Abs 06/07/2022 2.4  1.7 - 7.7 K/uL Final   Lymphocytes Relative 06/07/2022 54  % Final   Lymphs Abs 06/07/2022 3.1  0.7 - 4.0 K/uL Final   Monocytes Relative 06/07/2022 4  % Final   Monocytes Absolute 06/07/2022 0.2  0.1 - 1.0 K/uL Final   Eosinophils Relative 06/07/2022 0  % Final   Eosinophils Absolute 06/07/2022 0.0  0.0 - 0.5 K/uL Final   Basophils Relative 06/07/2022 0  % Final   Basophils Absolute 06/07/2022 0.0  0.0 - 0.1 K/uL Final   Immature Granulocytes 06/07/2022 0  % Final   Abs  Immature Granulocytes 06/07/2022 0.01  0.00 - 0.07 K/uL Final   Performed at [facility], [place] [address], [place], [place] [address]   Sodium 06/07/2022 139  135 - 145 mmol/L Final   Potassium 06/07/2022 4.0  3.5 - 5.1 mmol/L Final   Chloride 06/07/2022 104  98 - 111 mmol/L Final   CO2 06/07/2022 28  22 - 32 mmol/L Final   Glucose, Bld 06/07/2022 104 (H)  70 - 99 mg/dL Final   Glucose reference range applies only to samples taken after fasting for at least 8 hours.   BUN 06/07/2022 8  6 - 20 mg/dL Final   Creatinine, Ser 06/07/2022 0.84  0.44 - 1.00 mg/dL  Final   Calcium 06/07/2022 9.1  8.9 - 10.3 mg/dL Final   Total Protein 06/07/2022 6.9  6.5 - 8.1 g/dL Final   Albumin 06/07/2022 3.7  3.5 - 5.0 g/dL Final   AST 06/07/2022 19  15 - 41 U/L Final   ALT 06/07/2022 22  0 - 44 U/L Final   Alkaline Phosphatase 06/07/2022 54  38 - 126 U/L Final   Total Bilirubin 06/07/2022 0.4  0.3 - 1.2 mg/dL Final   GFR, Estimated 06/07/2022 >60  >60 mL/min Final   Comment: (NOTE) Calculated using the CKD-EPI Creatinine Equation (2021)    Anion gap 06/07/2022 7  5 - 15 Final   Performed at [place] [address], [place], [place] [address]   Hgb A1c MFr Bld 06/07/2022 5.0  4.8 - 5.6 % Final   Comment: (NOTE) Pre diabetes:          5.7%-6.4%  Diabetes:              >6.4%  Glycemic control for   <7.0% adults with diabetes    Mean Plasma Glucose 06/07/2022 96.8  mg/dL Final   Performed at [facility], [place] [address], [place], [place] [address]   TSH 06/07/2022 1.620  0.350 - 4.500 uIU/mL Final   Comment: Performed by a 3rd Generation assay with a functional sensitivity of <=0.01 uIU/mL. Performed at [facility], [place] [address], [place], [place] [address]    RPR Ser Ql 06/07/2022 NON REACTIVE  NON REACTIVE Final   Performed at [facility], [place] [address], [place], [place] [address]   Color, Urine 06/07/2022 YELLOW  YELLOW Final   APPearance 06/07/2022 HAZY (A)  CLEAR Final   Specific Gravity, Urine 06/07/2022 1.018  1.005 - 1.030 Final   pH 06/07/2022 7.0  5.0 - 8.0 Final   Glucose, UA 06/07/2022 NEGATIVE  NEGATIVE mg/dL Final   Hgb urine dipstick 06/07/2022 NEGATIVE  NEGATIVE Final   Bilirubin Urine 06/07/2022 NEGATIVE  NEGATIVE Final   Ketones, ur 06/07/2022 NEGATIVE  NEGATIVE mg/dL Final   Protein, ur 06/07/2022 NEGATIVE  NEGATIVE mg/dL Final   Nitrite 06/07/2022 NEGATIVE  NEGATIVE Final   Leukocytes,Ua 06/07/2022 NEGATIVE  NEGATIVE Final   Performed at [facility], [place] [address] [place], [place] [address]    Cholesterol 06/07/2022 164  0 - 200 mg/dL Final   Triglycerides 06/07/2022 158 (H)  <150 mg/dL Final   HDL 06/07/2022 44  >40 mg/dL Final   Total CHOL/HDL Ratio 06/07/2022 3.7  RATIO Final   VLDL 06/07/2022 32  0 - 40 mg/dL Final   LDL Cholesterol 06/07/2022 88  0 - 99 mg/dL Final   Comment:        Total Cholesterol/HDL:CHD Risk Coronary Heart  Disease Risk Table                     Men   Women  1/2 Average Risk   3.4   3.3  Average Risk       5.0   4.4  2 X Average Risk   9.6   7.1  3 X Average Risk  23.4   11.0        Use the calculated Patient Ratio above and the CHD Risk Table to determine the patient's CHD Risk.        ATP III CLASSIFICATION (LDL):  <100     mg/dL   Optimal  161-096  mg/dL   Near or Above                    Optimal  130-159  mg/dL   Borderline  045-409  mg/dL   High  >811     mg/dL   Very High Performed at [facility], [address], [place], [place] [address]    HIV Screen 4th Generation wRfx 06/07/2022 Non Reactive  Non Reactive Final   Performed at [facility], [address], [place], [place] [address]   SARSCOV2ONAVIRUS 2 AG 06/07/2022 NEGATIVE  NEGATIVE Final   Comment: (NOTE) SARS-CoV-2 antigen NOT DETECTED.   Negative results are presumptive.  Negative results do not preclude SARS-CoV-2 infection and should not be used as the sole basis for treatment or other patient management decisions, including infection  control decisions, particularly in the presence of clinical signs and  symptoms consistent with COVID-19, or in those who have been in contact with the virus.  Negative results must be combined with clinical observations, patient history, and epidemiological information. The expected result is Negative.  Fact Sheet for Patients: https://www.jennings-kim.com/  Fact Sheet for Healthcare Providers: https://alexander-rogers.biz/  This test is not yet approved or cleared by the [place] FDA and   has been authorized for detection and/or diagnosis of SARS-CoV-2 by FDA under an Emergency Use Authorization (EUA).  This EUA will remain in effect (meaning this test can be used) for the duration of  the COV                          ID-19 declaration under Section 564(b)(1) of the Act, 21 U.S.C. section 360bbb-3(b)(1), unless the authorization is terminated or revoked sooner.     POC Amphetamine UR 06/07/2022 None Detected  NONE DETECTED (Cut Off Level 1000 ng/mL) Final   POC Secobarbital (BAR) 06/07/2022 None Detected  NONE DETECTED (Cut Off Level 300 ng/mL) Final   POC Buprenorphine (BUP) 06/07/2022 None Detected  NONE DETECTED (Cut Off Level 10 ng/mL) Final   POC Oxazepam (BZO) 06/07/2022 None Detected  NONE DETECTED (Cut Off Level 300 ng/mL) Final   POC Cocaine UR 06/07/2022 None Detected  NONE DETECTED (Cut Off Level 300 ng/mL) Final   POC Methamphetamine UR 06/07/2022 None Detected  NONE DETECTED (Cut Off Level 1000 ng/mL) Final   POC Morphine 06/07/2022 None Detected  NONE DETECTED (Cut Off Level 300 ng/mL) Final   POC Methadone UR 06/07/2022 None Detected  NONE DETECTED (Cut Off Level 300 ng/mL) Final   POC Oxycodone UR 06/07/2022 None Detected  NONE DETECTED (Cut Off Level 100 ng/mL) Final   POC Marijuana UR 06/07/2022 None Detected  NONE DETECTED (Cut Off Level 50 ng/mL) Final   Preg Test, Ur 06/08/2022 NEGATIVE  NEGATIVE Final   Comment:        THE SENSITIVITY OF THIS METHODOLOGY IS >20 mIU/mL. Performed at [facility], [address], [place], [place] [address]    Preg Test, Ur 06/08/2022 NEGATIVE  NEGATIVE Final   Comment:        THE SENSITIVITY OF THIS METHODOLOGY IS >24 mIU/mL     Blood Alcohol level:  Lab Results  Component Value Date   ETH <10 07/04/2022   ETH <10 02/07/2022    Metabolic Disorder Labs: Lab Results  Component Value Date   HGBA1C 5.7 (H) 10/15/2022   MPG 117 10/15/2022   MPG 96.8 06/07/2022   No results found for:  "PROLACTIN" Lab Results  Component Value Date   CHOL 181 07/25/2022   TRIG 118 07/25/2022   HDL 45 07/25/2022   CHOLHDL 4.0 07/25/2022   VLDL 24 07/25/2022   LDLCALC 112 (H) 07/25/2022   LDLCALC 88 06/07/2022    Therapeutic Lab Levels: No results found for: "LITHIUM" No results found for: "VALPROATE" No results found for: "CBMZ"  Physical Findings   GAD-7    Flowsheet Row Office Visit from 12/17/2021 in [facility] for [facility] [place]  Total GAD-7 Score 17      PHQ2-9    Flowsheet Row ED from 10/07/2022 in [facility] ED from 06/07/2022 in [facility] Office Visit from 12/17/2021 in [facility] Total Score 0 1 2  PHQ-9 Total Score -- 2 8      Flowsheet Row ED from 10/07/2022 in [facility] Most recent reading at 11/03/2022  9:20 AM ED from 10/07/2022 in [facility] Emergency Department at [facility] Most recent reading at 10/07/2022 12:18 PM ED from 07/25/2022 in [facility] Most recent reading at 10/06/2022  9:38 AM  C-SSRS RISK CATEGORY No Risk No Risk No Risk        Musculoskeletal  Strength & Muscle Tone: within normal limits Gait & Station: normal Patient leans: N/A  Psychiatric Specialty Exam  Presentation  General Appearance:  Appropriate for Environment  Eye Contact: Absent  Speech: Clear and Coherent  Speech Volume: Normal  Handedness: Right   Mood and Affect  Mood: Euthymic  Affect: Appropriate   Thought Process  Thought Processes: Coherent  Descriptions of Associations:Intact  Orientation:Full (Time, Place and Person)  Thought Content:Abstract Reasoning  Diagnosis of Schizophrenia or Schizoaffective disorder in past: Yes  Duration of Psychotic Symptoms: Greater than six months   Hallucinations:Hallucinations: None  Ideas of  Reference:None  Suicidal Thoughts:Suicidal Thoughts: No  Homicidal Thoughts:Homicidal Thoughts: No   Sensorium  Memory: Immediate Good; Recent Good; Remote Fair  Judgment: Good  Insight: Good   Executive Functions  Concentration: Good  Attention Span: Good  Recall: Fair  Fund of Knowledge: Good  Language: Good   Psychomotor Activity  Psychomotor Activity: Psychomotor Activity: Normal   Assets  Assets: Communication Skills; Desire for Improvement; Financial Resources/Insurance; Resilience; Physical Health; Leisure Time   Sleep  Sleep: Sleep: Good   Nutritional Assessment (For [facility] and [facility] admissions only) Has the patient had a weight loss or gain of 10 pounds or more in the last 3 months?: No Has the patient had a decrease in food intake/or appetite?: No Does the patient have dental problems?: No Does the patient have eating habits or behaviors that may be indicators of an eating disorder  including binging or inducing vomiting?: No Has the patient recently lost weight without trying?: 0 Has the patient been eating poorly because of a decreased appetite?: 0 Malnutrition Screening Tool Score: 0    Physical Exam  Physical Exam Constitutional:      Appearance: Normal appearance. She is normal weight.  HENT:     Head: Normocephalic.  Eyes:     General:        Right eye: No discharge.        Left eye: No discharge.  Cardiovascular:     Rate and Rhythm: Normal rate.  Pulmonary:     Effort: Pulmonary effort is normal. No respiratory distress.  Musculoskeletal:        General: Normal range of motion.     Cervical back: Normal range of motion.  Skin:    Coloration: Skin is not jaundiced or pale.  Neurological:     Mental Status: She is alert and oriented to person, place, and time.  Psychiatric:        Attention and Perception: Attention and perception normal.        Mood and Affect: Mood and affect normal.        Speech: Speech normal.         Behavior: Behavior normal. Behavior is cooperative.        Thought Content: Thought content normal.        Cognition and Memory: Cognition normal.        Judgment: Judgment normal.    Review of Systems  Constitutional: Negative.   Eyes: Negative.   Respiratory: Negative.    Cardiovascular: Negative.   Neurological: Negative.   Psychiatric/Behavioral: Negative.     Blood pressure 100/68, pulse 99, temperature 98.4 F (36.9 C), temperature source Tympanic, resp. rate 12, height 5\' 1"  (1.549 m), weight 89.8 kg, SpO2 100 %. Body mass index is 37.41 kg/m.  Treatment Plan Summary: Daily contact with patient to assess and evaluate symptoms and progress in treatment and Medication management   Pt  has been cleared by psychiatry. TOC team continues to seek disposition.  Patient was seen face to face by this provider and student Cindee SaltIrene Akpan RN. I agree with assessment and patient continues to remain psychiatrically cleared. Patient has a treatment Team meeting today at 1pm.   Vernard Gamblesarolyn Perl Kerney NP  11/04/2022 09:44 AM  Reola CalkinsIrene A Essien-Akpan, RN Student  11/04/2022 9:44 AM

## 2022-11-04 NOTE — ED Notes (Signed)
Pt sleeping at present, no distress noted.  Monitoring for safety. 

## 2022-11-04 NOTE — ED Notes (Signed)
Pt off unit in meeting with social worker.

## 2022-11-04 NOTE — ED Notes (Signed)
Pt alert and calm at this hour. No apparent distress. RR even and unlabored. Monitored for safety.

## 2022-11-05 DIAGNOSIS — N9489 Other specified conditions associated with female genital organs and menstrual cycle: Secondary | ICD-10-CM | POA: Diagnosis not present

## 2022-11-05 DIAGNOSIS — N390 Urinary tract infection, site not specified: Secondary | ICD-10-CM | POA: Diagnosis not present

## 2022-11-05 DIAGNOSIS — F209 Schizophrenia, unspecified: Secondary | ICD-10-CM | POA: Diagnosis not present

## 2022-11-05 DIAGNOSIS — F333 Major depressive disorder, recurrent, severe with psychotic symptoms: Secondary | ICD-10-CM | POA: Diagnosis not present

## 2022-11-05 DIAGNOSIS — E039 Hypothyroidism, unspecified: Secondary | ICD-10-CM | POA: Diagnosis not present

## 2022-11-05 DIAGNOSIS — F17200 Nicotine dependence, unspecified, uncomplicated: Secondary | ICD-10-CM | POA: Diagnosis not present

## 2022-11-05 DIAGNOSIS — E118 Type 2 diabetes mellitus with unspecified complications: Secondary | ICD-10-CM | POA: Diagnosis not present

## 2022-11-05 DIAGNOSIS — F419 Anxiety disorder, unspecified: Secondary | ICD-10-CM | POA: Diagnosis not present

## 2022-11-05 DIAGNOSIS — Z1152 Encounter for screening for COVID-19: Secondary | ICD-10-CM | POA: Diagnosis not present

## 2022-11-05 NOTE — ED Notes (Signed)
Pt asleep at this hour. No apparent distress. RR even and unlabored. Monitored for safety.

## 2022-11-05 NOTE — ED Provider Notes (Signed)
Behavioral Health Progress Note  Date and Time: 11/05/2022 9:44 PM Name: Nicole [name] MRN:  [id number]  Nicole [name] is a 32 y.o. female, with PMH of SCZ unspecified, MDD with psychosis, who presented voluntary to [facility] (07/25/2022) via [employer] after a verbal altercation with [name] (not patient's legal guardian) for transient SI.  This is patient's fourth visit to [facility] for similar concerns this year. Patient has been dismissed from previous group home due to threatening SI and HI, then leaving the group home.  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  She is in no acute distress.  She appears a bit irritable today upon approach.  States she slept too much last night.  She continues to deny any concerns with appetite.  She is denying depression/SI/HI/AVH.  She is complaining of left breast pain and states, "I think I have breast cancer".  Patient was taken to the bathroom where privacy could be ensured.  A manual breast exam was performed by this Probation officer.  There were no palpable lumps in her left breast.  It was symmetrical to the right breast.  There was no redness or heat to the breast.  There was no skin irritation under the breast.  After breasts were examined patient was provided reassurance and support.  Her mood appeared to be improved.  She was educated that if her pain were to increase to please notify nursing.  She continues to remain psychiatrically cleared.  She has a treatment team meeting on 11/07/2022.  TOC/DSS is seeking a Respite ALF.  There is a long-term ALF that is willing to accept [place] but that ALF is not registered in [facility] and this process will take 4-6 weeks.   Diagnosis:  Final diagnoses:  Tobacco use disorder  Schizophrenia, unspecified type ([place])  MDD (major depressive disorder), recurrent episode, moderate ([facility])  Hypothyroidism, unspecified type    Total Time spent with  patient: 30 minutes  Past Psychiatric History: Schizophrenia unspecified, MDD, anxiety, depression, tobacco use. Past Medical History: Hypothyroidism Family History: Hypertension-father diabetes-father Family Psychiatric  History: See H&P Social History: See H&P  Additional Social History:        Sleep: Good  Appetite:  Good  Current Medications:  Current Facility-Administered Medications  Medication Dose Route Frequency Provider Last Rate Last Admin   acetaminophen (TYLENOL) tablet 650 mg  650 mg Oral Q6H PRN Haynes Kerns, NP   650 mg at 11/04/22 2154   alum & mag hydroxide-simeth (MAALOX/MYLANTA) 200-200-20 MG/5ML suspension 30 mL  30 mL Oral Q4H PRN Haynes Kerns, NP   30 mL at 10/26/22 1755   ARIPiprazole ER (ABILIFY MAINTENA) 400 MG prefilled syringe 400 mg  400 mg Intramuscular Q28 days Merrily Brittle, DO   400 mg at 10/25/22 0920   cetaphil lotion   Topical PRN Merrily Brittle, DO       fluticasone [facility]) 50 MCG/ACT nasal spray 1 spray  1 spray Each Nare QHS Merrily Brittle, DO   1 spray at 11/05/22 2119   hydrOXYzine (ATARAX) tablet 25 mg  25 mg Oral TID PRN Christene Slates, MD   25 mg at 11/05/22 2119   ketoconazole (NIZORAL) 2 % cream   Topical BID Merrily Brittle, DO   Given at 11/05/22 2119   levothyroxine (SYNTHROID) tablet 100 mcg  100 mcg Oral Once Haynes Kerns, NP       levothyroxine (SYNTHROID) tablet 100  mcg  100 mcg Oral Q0600 Haynes Kerns, NP   100 mcg at 11/05/22 [id number]   lidocaine (LIDODERM) 5 % 1 patch  1 patch Transdermal Q24H Merrily Brittle, DO   1 patch at 11/05/22 0954   loratadine (CLARITIN) tablet 10 mg  10 mg Oral Daily Carrion-Carrero, Caryl Ada, MD   10 mg at 11/05/22 0955   magnesium hydroxide (MILK OF MAGNESIA) suspension 30 mL  30 mL Oral Daily PRN Haynes Kerns, NP   30 mL at 10/12/22 2145   metFORMIN (GLUCOPHAGE) tablet 500 mg  500 mg Oral Q breakfast Carrion-Carrero, Caryl Ada, MD   500 mg at 11/05/22 0955    nicotine (NICODERM CQ - dosed in mg/24 hours) patch 14 mg  14 mg Transdermal Daily PRN Carrion-Carrero, Caryl Ada, MD       ondansetron (ZOFRAN-ODT) disintegrating tablet 4 mg  4 mg Oral Q8H PRN Carrion-Carrero, Margely, MD   4 mg at 11/01/22 2246   Oxcarbazepine (TRILEPTAL) tablet 300 mg  300 mg Oral BID Byungura, Veronique M, NP   300 mg at 11/05/22 2119   pantoprazole (PROTONIX) EC tablet 40 mg  40 mg Oral Daily Carrion-Carrero, Caryl Ada, MD   40 mg at 11/05/22 0955   polyethylene glycol (MIRALAX / GLYCOLAX) packet 17 g  17 g Oral Daily Merrily Brittle, DO   17 g at 11/05/22 0954   QUEtiapine (SEROQUEL) tablet 400 mg  400 mg Oral BID Maple Hudson, Veronique M, NP   400 mg at 11/05/22 2119   sertraline (ZOLOFT) tablet 150 mg  150 mg Oral Daily Carrion-Carrero, Caryl Ada, MD   150 mg at 11/05/22 0955   sodium chloride (OCEAN) 0.65 % nasal spray 1 spray  1 spray Each Nare Daily Merrily Brittle, DO   1 spray at 11/05/22 0954   traZODone (DESYREL) tablet 100 mg  100 mg Oral QHS Ronelle Nigh M, NP   100 mg at 11/05/22 2119   traZODone (DESYREL) tablet 50 mg  50 mg Oral QHS PRN Haynes Kerns, NP   50 mg at 11/03/22 2117   valACYclovir (VALTREX) tablet 500 mg  500 mg Oral Daily Carrion-Carrero, Caryl Ada, MD   500 mg at 11/05/22 0955   zinc oxide (BALMEX) 11.3 % cream   Topical PRN Merrily Brittle, DO       Current Outpatient Medications  Medication Sig Dispense Refill   ABILIFY MAINTENA 400 MG PRSY prefilled syringe 400 mg every 28 (twenty-eight) days.     cetirizine (ZYRTEC) 10 MG tablet Take 10 mg by mouth daily.     cyclobenzaprine (FLEXERIL) 10 MG tablet Take 1 tablet (10 mg total) by mouth 2 (two) times daily as needed for muscle spasms. 20 tablet 0   fluticasone (FLONASE) 50 MCG/ACT nasal spray Place 1 spray into both nostrils daily.     meloxicam (MOBIC) 15 MG tablet Take 15 mg by mouth daily.     nitrofurantoin, macrocrystal-monohydrate, (MACROBID) 100 MG capsule Take 1 capsule (100 mg  total) by mouth 2 (two) times daily. 10 capsule 0   ondansetron (ZOFRAN-ODT) 4 MG disintegrating tablet Take 1 tablet (4 mg total) by mouth every 8 (eight) hours as needed for nausea or vomiting. 20 tablet 0   Oxcarbazepine (TRILEPTAL) 300 MG tablet Take 1 tablet (300 mg total) by mouth 2 (two) times daily. 60 tablet 0   QUEtiapine (SEROQUEL) 400 MG tablet Take 1 tablet (400 mg total) by mouth 2 (two) times daily. 60 tablet 0   sertraline (ZOLOFT) 50 MG tablet Take  3 tablets (150 mg total) by mouth in the morning. 90 tablet 0   traZODone (DESYREL) 100 MG tablet Take 1 tablet (100 mg total) by mouth at bedtime. 30 tablet 0   valACYclovir (VALTREX) 500 MG tablet Take 500 mg by mouth daily.      Labs  Lab Results:  Admission on 10/07/2022  Component Date Value Ref Range Status   Hgb A1c MFr Bld 10/15/2022 5.7 (H)  4.8 - 5.6 % Final   Comment: (NOTE)         Prediabetes: 5.7 - 6.4         Diabetes: >6.4         Glycemic control for adults with diabetes: <7.0    Mean Plasma Glucose 10/15/2022 117  mg/dL Final   Comment: (NOTE) Performed At: [facility] [place], [place] [id number] Rush Farmer MD [id number]   Office Visit on 10/21/2022  Component Date Value Ref Range Status   SARSCOV2ONAVIRUS 2 AG 11/01/2022 NEGATIVE  NEGATIVE Final   Comment: (NOTE) SARS-CoV-2 antigen NOT DETECTED.   Negative results are presumptive.  Negative results do not preclude SARS-CoV-2 infection and should not be used as the sole basis for treatment or other patient management decisions, including infection  control decisions, particularly in the presence of clinical signs and  symptoms consistent with COVID-19, or in those who have been in contact with the virus.  Negative results must be combined with clinical observations, patient history, and epidemiological information. The expected result is Negative.  Fact Sheet for Patients:  HandmadeRecipes.com.cy  Fact Sheet for Healthcare Providers: FuneralLife.at  This test is not yet approved or cleared by the [place] FDA and  has been authorized for detection and/or diagnosis of SARS-CoV-2 by FDA under an Emergency Use Authorization (EUA).  This EUA will remain in effect (meaning this test can be used) for the duration of  the COV                          ID-19 declaration under Section 564(b)(1) of the Act, 21 U.S.C. section 360bbb-3(b)(1), unless the authorization is terminated or revoked sooner.    Admission on 10/07/2022, Discharged on 10/07/2022  Component Date Value Ref Range Status   Color, Urine 10/07/2022 YELLOW  YELLOW Final   APPearance 10/07/2022 HAZY (A)  CLEAR Final   Specific Gravity, Urine 10/07/2022 1.011  1.005 - 1.030 Final   pH 10/07/2022 7.0  5.0 - 8.0 Final   Glucose, UA 10/07/2022 NEGATIVE  NEGATIVE mg/dL Final   Hgb urine dipstick 10/07/2022 SMALL (A)  NEGATIVE Final   Bilirubin Urine 10/07/2022 NEGATIVE  NEGATIVE Final   Ketones, ur 10/07/2022 NEGATIVE  NEGATIVE mg/dL Final   Protein, ur 10/07/2022 NEGATIVE  NEGATIVE mg/dL Final   Nitrite 10/07/2022 NEGATIVE  NEGATIVE Final   Leukocytes,Ua 10/07/2022 LARGE (A)  NEGATIVE Final   RBC / HPF 10/07/2022 0-5  0 - 5 RBC/hpf Final   WBC, UA 10/07/2022 0-5  0 - 5 WBC/hpf Final   Bacteria, UA 10/07/2022 FEW (A)  NONE SEEN Final   Squamous Epithelial / HPF 10/07/2022 11-20  0 - 5 Final   Performed at [facility], [place] [address], [place], [place] [address]   I-stat hCG, quantitative 10/07/2022 <5.0  <5 mIU/mL Final   Comment 3 10/07/2022          Final   Comment:   GEST. AGE      CONC.  (  mIU/mL)   <=1 WEEK        5 - 50     2 WEEKS       50 - 500     3 WEEKS       100 - 10,000     4 WEEKS     1,000 - 30,000        FEMALE AND NON-PREGNANT FEMALE:     LESS THAN 5 mIU/mL    Sodium 10/07/2022 138  135 - 145 mmol/L Final   Potassium  10/07/2022 4.3  3.5 - 5.1 mmol/L Final   Chloride 10/07/2022 104  98 - 111 mmol/L Final   CO2 10/07/2022 28  22 - 32 mmol/L Final   Glucose, Bld 10/07/2022 90  70 - 99 mg/dL Final   Glucose reference range applies only to samples taken after fasting for at least 8 hours.   BUN 10/07/2022 8  6 - 20 mg/dL Final   Creatinine, Ser 10/07/2022 0.96  0.44 - 1.00 mg/dL Final   Calcium 10/07/2022 9.1  8.9 - 10.3 mg/dL Final   GFR, Estimated 10/07/2022 >60  >60 mL/min Final   Comment: (NOTE) Calculated using the CKD-EPI Creatinine Equation (2021)    Anion gap 10/07/2022 6  5 - 15 Final   Performed at [facility], [place] [address], [place], [place] [address]   WBC 10/07/2022 5.8  4.0 - 10.5 K/uL Final   RBC 10/07/2022 4.36  3.87 - 5.11 MIL/uL Final   Hemoglobin 10/07/2022 12.9  12.0 - 15.0 g/dL Final   HCT 10/07/2022 40.0  36.0 - 46.0 % Final   MCV 10/07/2022 91.7  80.0 - 100.0 fL Final   MCH 10/07/2022 29.6  26.0 - 34.0 pg Final   MCHC 10/07/2022 32.3  30.0 - 36.0 g/dL Final   RDW 10/07/2022 12.4  11.5 - 15.5 % Final   Platelets 10/07/2022 295  150 - 400 K/uL Final   nRBC 10/07/2022 0.0  0.0 - 0.2 % Final   Performed at [facility] [address] [place], [place] [address]   SARS Coronavirus 2 by RT PCR 10/07/2022 NEGATIVE  NEGATIVE Final   Comment: (NOTE) SARS-CoV-2 target nucleic acids are NOT DETECTED.  The SARS-CoV-2 RNA is generally detectable in upper respiratory specimens during the acute phase of infection. The lowest concentration of SARS-CoV-2 viral copies this assay can detect is 138 copies/mL. A negative result does not preclude SARS-Cov-2 infection and should not be used as the sole basis for treatment or other patient management decisions. A negative result may occur with  improper specimen collection/handling, submission of specimen other than nasopharyngeal swab, presence of viral mutation(s) within the areas targeted by this assay, and inadequate number of  viral copies(<138 copies/mL). A negative result must be combined with clinical observations, patient history, and epidemiological information. The expected result is Negative.  Fact Sheet for Patients:  EntrepreneurPulse.com.au  Fact Sheet for Healthcare Providers:  IncredibleEmployment.be  This test is no                          t yet approved or cleared by the [place] FDA and  has been authorized for detection and/or diagnosis of SARS-CoV-2 by FDA under an Emergency Use Authorization (EUA). This EUA will remain  in effect (meaning this test can be used) for the duration of the COVID-19 declaration under Section 564(b)(1) of the Act, 21 U.S.C.section 360bbb-3(b)(1), unless the authorization is  terminated  or revoked sooner.       Influenza A by PCR 10/07/2022 NEGATIVE  NEGATIVE Final   Influenza B by PCR 10/07/2022 NEGATIVE  NEGATIVE Final   Comment: (NOTE) The Xpert Xpress SARS-CoV-2/FLU/RSV plus assay is intended as an aid in the diagnosis of influenza from Nasopharyngeal swab specimens and should not be used as a sole basis for treatment. Nasal washings and aspirates are unacceptable for Xpert Xpress SARS-CoV-2/FLU/RSV testing.  Fact Sheet for Patients: BloggerCourse.com  Fact Sheet for Healthcare Providers: SeriousBroker.it  This test is not yet approved or cleared by the [place] FDA and has been authorized for detection and/or diagnosis of SARS-CoV-2 by FDA under an Emergency Use Authorization (EUA). This EUA will remain in effect (meaning this test can be used) for the duration of the COVID-19 declaration under Section 564(b)(1) of the Act, 21 U.S.C. section 360bbb-3(b)(1), unless the authorization is terminated or revoked.     Resp Syncytial Virus by PCR 10/07/2022 NEGATIVE  NEGATIVE Final   Comment: (NOTE) Fact Sheet for  Patients: BloggerCourse.com  Fact Sheet for Healthcare Providers: SeriousBroker.it  This test is not yet approved or cleared by the [place] FDA and has been authorized for detection and/or diagnosis of SARS-CoV-2 by FDA under an Emergency Use Authorization (EUA). This EUA will remain in effect (meaning this test can be used) for the duration of the COVID-19 declaration under Section 564(b)(1) of the Act, 21 U.S.C. section 360bbb-3(b)(1), unless the authorization is terminated or revoked.  Performed at [facility], [address], [place], [place] [address]    Specimen Description 10/07/2022 URINE, CLEAN CATCH   Final   Special Requests 10/07/2022    Final                   Value:NONE Performed at [facility], [address] [place], [place] [address]    Culture 10/07/2022 MULTIPLE SPECIES PRESENT, SUGGEST RECOLLECTION (A)   Final   Report Status 10/07/2022 10/08/2022 FINAL   Final  Admission on 07/25/2022, Discharged on 10/07/2022  Component Date Value Ref Range Status   SARS Coronavirus 2 by RT PCR 07/25/2022 NEGATIVE  NEGATIVE Final   Comment: (NOTE) SARS-CoV-2 target nucleic acids are NOT DETECTED.  The SARS-CoV-2 RNA is generally detectable in upper respiratory specimens during the acute phase of infection. The lowest concentration of SARS-CoV-2 viral copies this assay can detect is 138 copies/mL. A negative result does not preclude SARS-Cov-2 infection and should not be used as the sole basis for treatment or other patient management decisions. A negative result may occur with  improper specimen collection/handling, submission of specimen other than nasopharyngeal swab, presence of viral mutation(s) within the areas targeted by this assay, and inadequate number of viral copies(<138 copies/mL). A negative result must be combined with clinical observations, patient history, and  epidemiological information. The expected result is Negative.  Fact Sheet for Patients:  BloggerCourse.com  Fact Sheet for Healthcare Providers:  SeriousBroker.it  This test is no                          t yet approved or cleared by the [place] FDA and  has been authorized for detection and/or diagnosis of SARS-CoV-2 by FDA under an Emergency Use Authorization (EUA). This EUA will remain  in effect (meaning this test can be used) for the duration of the COVID-19 declaration under Section 564(b)(1) of the Act, 21 U.S.C.section 360bbb-3(b)(1), unless  the authorization is terminated  or revoked sooner.       Influenza A by PCR 07/25/2022 NEGATIVE  NEGATIVE Final   Influenza B by PCR 07/25/2022 NEGATIVE  NEGATIVE Final   Comment: (NOTE) The Xpert Xpress SARS-CoV-2/FLU/RSV plus assay is intended as an aid in the diagnosis of influenza from Nasopharyngeal swab specimens and should not be used as a sole basis for treatment. Nasal washings and aspirates are unacceptable for Xpert Xpress SARS-CoV-2/FLU/RSV testing.  Fact Sheet for Patients: EntrepreneurPulse.com.au  Fact Sheet for Healthcare Providers: IncredibleEmployment.be  This test is not yet approved or cleared by the [place] FDA and has been authorized for detection and/or diagnosis of SARS-CoV-2 by FDA under an Emergency Use Authorization (EUA). This EUA will remain in effect (meaning this test can be used) for the duration of the COVID-19 declaration under Section 564(b)(1) of the Act, 21 U.S.C. section 360bbb-3(b)(1), unless the authorization is terminated or revoked.  Performed at [facility], [place] [address], [place], [place] [address]    WBC 07/25/2022 8.3  4.0 - 10.5 K/uL Final   RBC 07/25/2022 4.44  3.87 - 5.11 MIL/uL Final   Hemoglobin 07/25/2022 13.7  12.0 - 15.0 g/dL Final   HCT 07/25/2022 40.2  36.0 - 46.0  % Final   MCV 07/25/2022 90.5  80.0 - 100.0 fL Final   MCH 07/25/2022 30.9  26.0 - 34.0 pg Final   MCHC 07/25/2022 34.1  30.0 - 36.0 g/dL Final   RDW 07/25/2022 12.2  11.5 - 15.5 % Final   Platelets 07/25/2022 248  150 - 400 K/uL Final   nRBC 07/25/2022 0.0  0.0 - 0.2 % Final   Neutrophils Relative % 07/25/2022 43  % Final   Neutro Abs 07/25/2022 3.6  1.7 - 7.7 K/uL Final   Lymphocytes Relative 07/25/2022 52  % Final   Lymphs Abs 07/25/2022 4.2 (H)  0.7 - 4.0 K/uL Final   Monocytes Relative 07/25/2022 5  % Final   Monocytes Absolute 07/25/2022 0.4  0.1 - 1.0 K/uL Final   Eosinophils Relative 07/25/2022 0  % Final   Eosinophils Absolute 07/25/2022 0.0  0.0 - 0.5 K/uL Final   Basophils Relative 07/25/2022 0  % Final   Basophils Absolute 07/25/2022 0.0  0.0 - 0.1 K/uL Final   Immature Granulocytes 07/25/2022 0  % Final   Abs Immature Granulocytes 07/25/2022 0.02  0.00 - 0.07 K/uL Final   Performed at [facility], [place] [address], [place], [place] [address]   Sodium 07/25/2022 138  135 - 145 mmol/L Final   Potassium 07/25/2022 4.0  3.5 - 5.1 mmol/L Final   Chloride 07/25/2022 104  98 - 111 mmol/L Final   CO2 07/25/2022 29  22 - 32 mmol/L Final   Glucose, Bld 07/25/2022 83  70 - 99 mg/dL Final   Glucose reference range applies only to samples taken after fasting for at least 8 hours.   BUN 07/25/2022 11  6 - 20 mg/dL Final   Creatinine, Ser 07/25/2022 0.97  0.44 - 1.00 mg/dL Final   Calcium 07/25/2022 9.2  8.9 - 10.3 mg/dL Final   Total Protein 07/25/2022 7.0  6.5 - 8.1 g/dL Final   Albumin 07/25/2022 3.8  3.5 - 5.0 g/dL Final   AST 07/25/2022 18  15 - 41 U/L Final   ALT 07/25/2022 22  0 - 44 U/L Final   Alkaline Phosphatase 07/25/2022 64  38 - 126 U/L Final   Total Bilirubin 07/25/2022  0.2 (L)  0.3 - 1.2 mg/dL Final   GFR, Estimated 07/25/2022 >60  >60 mL/min Final   Comment: (NOTE) Calculated using the CKD-EPI Creatinine Equation (2021)    Anion gap 07/25/2022 5  5 - 15  Final   Performed at [facility], [place] [address], [place], [place] [address]   POC Amphetamine UR 07/25/2022 None Detected  NONE DETECTED (Cut Off Level 1000 ng/mL) Preliminary   POC Secobarbital (BAR) 07/25/2022 None Detected  NONE DETECTED (Cut Off Level 300 ng/mL) Preliminary   POC Buprenorphine (BUP) 07/25/2022 None Detected  NONE DETECTED (Cut Off Level 10 ng/mL) Preliminary   POC Oxazepam (BZO) 07/25/2022 None Detected  NONE DETECTED (Cut Off Level 300 ng/mL) Preliminary   POC Cocaine UR 07/25/2022 None Detected  NONE DETECTED (Cut Off Level 300 ng/mL) Preliminary   POC Methamphetamine UR 07/25/2022 None Detected  NONE DETECTED (Cut Off Level 1000 ng/mL) Preliminary   POC Morphine 07/25/2022 None Detected  NONE DETECTED (Cut Off Level 300 ng/mL) Preliminary   POC Methadone UR 07/25/2022 None Detected  NONE DETECTED (Cut Off Level 300 ng/mL) Preliminary   POC Oxycodone UR 07/25/2022 None Detected  NONE DETECTED (Cut Off Level 100 ng/mL) Preliminary   POC Marijuana UR 07/25/2022 None Detected  NONE DETECTED (Cut Off Level 50 ng/mL) Preliminary   SARSCOV2ONAVIRUS 2 AG 07/25/2022 NEGATIVE  NEGATIVE Final   Comment: (NOTE) SARS-CoV-2 antigen NOT DETECTED.   Negative results are presumptive.  Negative results do not preclude SARS-CoV-2 infection and should not be used as the sole basis for treatment or other patient management decisions, including infection  control decisions, particularly in the presence of clinical signs and  symptoms consistent with COVID-19, or in those who have been in contact with the virus.  Negative results must be combined with clinical observations, patient history, and epidemiological information. The expected result is Negative.  Fact Sheet for Patients: HandmadeRecipes.com.cy  Fact Sheet for Healthcare Providers: FuneralLife.at  This test is not yet approved or cleared by the [place] FDA and  has  been authorized for detection and/or diagnosis of SARS-CoV-2 by FDA under an Emergency Use Authorization (EUA).  This EUA will remain in effect (meaning this test can be used) for the duration of  the COV                          ID-19 declaration under Section 564(b)(1) of the Act, 21 U.S.C. section 360bbb-3(b)(1), unless the authorization is terminated or revoked sooner.     Cholesterol 07/25/2022 181  0 - 200 mg/dL Final   Triglycerides 07/25/2022 118  <150 mg/dL Final   HDL 07/25/2022 45  >40 mg/dL Final   Total CHOL/HDL Ratio 07/25/2022 4.0  RATIO Final   VLDL 07/25/2022 24  0 - 40 mg/dL Final   LDL Cholesterol 07/25/2022 112 (H)  0 - 99 mg/dL Final   Comment:        Total Cholesterol/HDL:CHD Risk Coronary Heart Disease Risk Table                     Men   Women  1/2 Average Risk   3.4   3.3  Average Risk       5.0   4.4  2 X Average Risk   9.6   7.1  3 X Average Risk  23.4   11.0        Use the calculated Patient Ratio above and the  CHD Risk Table to determine the patient's CHD Risk.        ATP III CLASSIFICATION (LDL):  <100     mg/dL   Optimal  100-129  mg/dL   Near or Above                    Optimal  130-159  mg/dL   Borderline  160-189  mg/dL   High  >190     mg/dL   Very High Performed at [facility] [address] [place], [place] [address]    TSH 07/25/2022 6.668 (H)  0.350 - 4.500 uIU/mL Final   Comment: Performed by a 3rd Generation assay with a functional sensitivity of <=0.01 uIU/mL. Performed at [facility], [place] [address] [place], [place] [address]    Glucose-Capillary 07/26/2022 104 (H)  70 - 99 mg/dL Final   Glucose reference range applies only to samples taken after fasting for at least 8 hours.   T3, Free 07/31/2022 2.3  2.0 - 4.4 pg/mL Final   Comment: (NOTE) Performed At: [facility] [place], [place] [id number] Rush Farmer MD [id number]    Free T4 07/31/2022 0.60 (L)  0.61 - 1.12 ng/dL Final    Comment: (NOTE) Biotin ingestion may interfere with free T4 tests. If the results are inconsistent with the TSH level, previous test results, or the clinical presentation, then consider biotin interference. If needed, order repeat testing after stopping biotin. Performed at [facility], [place] [address] [place], [place] [address]    Glucose-Capillary 08/29/2022 100 (H)  70 - 99 mg/dL Final   Glucose reference range applies only to samples taken after fasting for at least 8 hours.   TSH 09/05/2022 0.793  0.350 - 4.500 uIU/mL Final   Comment: Performed by a 3rd Generation assay with a functional sensitivity of <=0.01 uIU/mL. Performed at [facility], [place] [address] [place], [place] [address]    Free T4 09/05/2022 0.73  0.61 - 1.12 ng/dL Final   Comment: (NOTE) Biotin ingestion may interfere with free T4 tests. If the results are inconsistent with the TSH level, previous test results, or the clinical presentation, then consider biotin interference. If needed, order repeat testing after stopping biotin. Performed at [facility], [place] [address], [place], [place] [address]    Preg Test, Ur 09/06/2022 Negative  Negative Final   Preg Test, Ur 09/06/2022 NEGATIVE  NEGATIVE Final   Comment:        THE SENSITIVITY OF THIS METHODOLOGY IS >24 mIU/mL    Preg Test, Ur 09/05/2022 NEGATIVE  NEGATIVE Final   Comment:        THE SENSITIVITY OF THIS METHODOLOGY IS >24 mIU/mL    Sodium 09/13/2022 136  135 - 145 mmol/L Final   Potassium 09/13/2022 4.5  3.5 - 5.1 mmol/L Final   Chloride 09/13/2022 105  98 - 111 mmol/L Final   CO2 09/13/2022 21 (L)  22 - 32 mmol/L Final   Glucose, Bld 09/13/2022 111 (H)  70 - 99 mg/dL Final   Glucose reference range applies only to samples taken after fasting for at least 8 hours.   BUN 09/13/2022 14  6 - 20 mg/dL Final   Creatinine, Ser 09/13/2022 0.92  0.44 - 1.00 mg/dL Final   Calcium 09/13/2022 9.1  8.9 - 10.3 mg/dL Final   GFR, Estimated  09/13/2022 >60  >60 mL/min Final   Comment: (NOTE) Calculated using the  CKD-EPI Creatinine Equation (2021)    Anion gap 09/13/2022 10  5 - 15 Final   Performed at [facility], [place] [address], [place], [place] [address]   Vitamin B-12 09/13/2022 585  180 - 914 pg/mL Final   Comment: (NOTE) This assay is not validated for testing neonatal or myeloproliferative syndrome specimens for Vitamin B12 levels. Performed at [facility], [place] [address], [place], [place] [address]    Color, Urine 09/14/2022 YELLOW  YELLOW Final   APPearance 09/14/2022 HAZY (A)  CLEAR Final   Specific Gravity, Urine 09/14/2022 1.025  1.005 - 1.030 Final   pH 09/14/2022 5.0  5.0 - 8.0 Final   Glucose, UA 09/14/2022 NEGATIVE  NEGATIVE mg/dL Final   Hgb urine dipstick 09/14/2022 NEGATIVE  NEGATIVE Final   Bilirubin Urine 09/14/2022 NEGATIVE  NEGATIVE Final   Ketones, ur 09/14/2022 NEGATIVE  NEGATIVE mg/dL Final   Protein, ur 09/14/2022 NEGATIVE  NEGATIVE mg/dL Final   Nitrite 09/14/2022 NEGATIVE  NEGATIVE Final   Leukocytes,Ua 09/14/2022 TRACE (A)  NEGATIVE Final   RBC / HPF 09/14/2022 0-5  0 - 5 RBC/hpf Final   WBC, UA 09/14/2022 0-5  0 - 5 WBC/hpf Final   Bacteria, UA 09/14/2022 RARE (A)  NONE SEEN Final   Squamous Epithelial / HPF 09/14/2022 0-5  0 - 5 Final   Mucus 09/14/2022 PRESENT   Final   Performed at [facility], [place] [address], [place], [place] [address]   Glucose-Capillary 09/16/2022 105 (H)  70 - 99 mg/dL Final   Glucose reference range applies only to samples taken after fasting for at least 8 hours.   SARS Coronavirus 2 by RT PCR 09/30/2022 NEGATIVE  NEGATIVE Final   Comment: (NOTE) SARS-CoV-2 target nucleic acids are NOT DETECTED.  The SARS-CoV-2 RNA is generally detectable in upper respiratory specimens during the acute phase of infection. The lowest concentration of SARS-CoV-2 viral copies this assay can detect is 138 copies/mL. A negative result does not preclude  SARS-Cov-2 infection and should not be used as the sole basis for treatment or other patient management decisions. A negative result may occur with  improper specimen collection/handling, submission of specimen other than nasopharyngeal swab, presence of viral mutation(s) within the areas targeted by this assay, and inadequate number of viral copies(<138 copies/mL). A negative result must be combined with clinical observations, patient history, and epidemiological information. The expected result is Negative.  Fact Sheet for Patients:  EntrepreneurPulse.com.au  Fact Sheet for Healthcare Providers:  IncredibleEmployment.be  This test is no                          t yet approved or cleared by the [place] FDA and  has been authorized for detection and/or diagnosis of SARS-CoV-2 by FDA under an Emergency Use Authorization (EUA). This EUA will remain  in effect (meaning this test can be used) for the duration of the COVID-19 declaration under Section 564(b)(1) of the Act, 21 U.S.C.section 360bbb-3(b)(1), unless the authorization is terminated  or revoked sooner.       Influenza A by PCR 09/30/2022 NEGATIVE  NEGATIVE Final   Influenza B by PCR 09/30/2022 NEGATIVE  NEGATIVE Final   Comment: (NOTE) The Xpert Xpress SARS-CoV-2/FLU/RSV plus assay is intended as an aid in the diagnosis of influenza from Nasopharyngeal swab specimens and should not be used as a sole basis for treatment. Nasal washings and aspirates are unacceptable for Xpert Xpress  SARS-CoV-2/FLU/RSV testing.  Fact Sheet for Patients: EntrepreneurPulse.com.au  Fact Sheet for Healthcare Providers: IncredibleEmployment.be  This test is not yet approved or cleared by the [place] FDA and has been authorized for detection and/or diagnosis of SARS-CoV-2 by FDA under an Emergency Use Authorization (EUA). This EUA will remain in effect  (meaning this test can be used) for the duration of the COVID-19 declaration under Section 564(b)(1) of the Act, 21 U.S.C. section 360bbb-3(b)(1), unless the authorization is terminated or revoked.     Resp Syncytial Virus by PCR 09/30/2022 NEGATIVE  NEGATIVE Final   Comment: (NOTE) Fact Sheet for Patients: EntrepreneurPulse.com.au  Fact Sheet for Healthcare Providers: IncredibleEmployment.be  This test is not yet approved or cleared by the [place] FDA and has been authorized for detection and/or diagnosis of SARS-CoV-2 by FDA under an Emergency Use Authorization (EUA). This EUA will remain in effect (meaning this test can be used) for the duration of the COVID-19 declaration under Section 564(b)(1) of the Act, 21 U.S.C. section 360bbb-3(b)(1), unless the authorization is terminated or revoked.  Performed at [facility], [place] [address], [place], [place] [address]    Sodium 10/01/2022 136  135 - 145 mmol/L Final   Potassium 10/01/2022 3.8  3.5 - 5.1 mmol/L Final   Chloride 10/01/2022 104  98 - 111 mmol/L Final   CO2 10/01/2022 27  22 - 32 mmol/L Final   Glucose, Bld 10/01/2022 98  70 - 99 mg/dL Final   Glucose reference range applies only to samples taken after fasting for at least 8 hours.   BUN 10/01/2022 12  6 - 20 mg/dL Final   Creatinine, Ser 10/01/2022 0.98  0.44 - 1.00 mg/dL Final   Calcium 10/01/2022 8.9  8.9 - 10.3 mg/dL Final   GFR, Estimated 10/01/2022 >60  >60 mL/min Final   Comment: (NOTE) Calculated using the CKD-EPI Creatinine Equation (2021)    Anion gap 10/01/2022 5  5 - 15 Final   Performed at [facility], [place] [address], [place], [place] [address]   WBC 10/01/2022 5.1  4.0 - 10.5 K/uL Final   RBC 10/01/2022 4.31  3.87 - 5.11 MIL/uL Final   Hemoglobin 10/01/2022 12.7  12.0 - 15.0 g/dL Final   HCT 10/01/2022 38.8  36.0 - 46.0 % Final   MCV 10/01/2022 90.0  80.0 - 100.0 fL Final   MCH 10/01/2022 29.5   26.0 - 34.0 pg Final   MCHC 10/01/2022 32.7  30.0 - 36.0 g/dL Final   RDW 10/01/2022 12.4  11.5 - 15.5 % Final   Platelets 10/01/2022 300  150 - 400 K/uL Final   nRBC 10/01/2022 0.0  0.0 - 0.2 % Final   Performed at [place] [address] [place], [place] [address]   Lipase 10/01/2022 29  11 - 51 U/L Final   Performed at [place] [address], [place], [place] [address]   Color, Urine 10/05/2022 YELLOW  YELLOW Final   APPearance 10/05/2022 HAZY (A)  CLEAR Final   Specific Gravity, Urine 10/05/2022 1.018  1.005 - 1.030 Final   pH 10/05/2022 5.0  5.0 - 8.0 Final   Glucose, UA 10/05/2022 NEGATIVE  NEGATIVE mg/dL Final   Hgb urine dipstick 10/05/2022 NEGATIVE  NEGATIVE Final   Bilirubin Urine 10/05/2022 NEGATIVE  NEGATIVE Final   Ketones, ur 10/05/2022 NEGATIVE  NEGATIVE mg/dL Final   Protein, ur 10/05/2022 NEGATIVE  NEGATIVE mg/dL Final   Nitrite 10/05/2022 NEGATIVE  NEGATIVE  Final   Leukocytes,Ua 10/05/2022 TRACE (A)  NEGATIVE Final   RBC / HPF 10/05/2022 0-5  0 - 5 RBC/hpf Final   WBC, UA 10/05/2022 0-5  0 - 5 WBC/hpf Final   Bacteria, UA 10/05/2022 RARE (A)  NONE SEEN Final   Squamous Epithelial / HPF 10/05/2022 0-5  0 - 5 Final   Mucus 10/05/2022 PRESENT   Final   Performed at [facility], [place] [address] [place], [place] [address]   Specimen Description 10/05/2022 URINE, CLEAN CATCH   Final   Special Requests 10/05/2022    Final                   Value:NONE Performed at [facility], [place] [address] [place], [place] [address]    Culture 10/05/2022 10,000 COLONIES/mL MULTIPLE SPECIES PRESENT, SUGGEST RECOLLECTION (A)   Final   Report Status 10/05/2022 10/07/2022 FINAL   Final  Admission on 07/04/2022, Discharged on 07/05/2022  Component Date Value Ref Range Status   Sodium 07/04/2022 142  135 - 145 mmol/L Final   Potassium 07/04/2022 4.4  3.5 - 5.1 mmol/L Final   Chloride 07/04/2022 109  98 - 111 mmol/L Final   CO2 07/04/2022 26  22 - 32  mmol/L Final   Glucose, Bld 07/04/2022 96  70 - 99 mg/dL Final   Glucose reference range applies only to samples taken after fasting for at least 8 hours.   BUN 07/04/2022 15  6 - 20 mg/dL Final   Creatinine, Ser 07/04/2022 0.83  0.44 - 1.00 mg/dL Final   Calcium 07/04/2022 9.3  8.9 - 10.3 mg/dL Final   Total Protein 07/04/2022 7.2  6.5 - 8.1 g/dL Final   Albumin 07/04/2022 3.9  3.5 - 5.0 g/dL Final   AST 07/04/2022 23  15 - 41 U/L Final   ALT 07/04/2022 28  0 - 44 U/L Final   Alkaline Phosphatase 07/04/2022 77  38 - 126 U/L Final   Total Bilirubin 07/04/2022 0.4  0.3 - 1.2 mg/dL Final   GFR, Estimated 07/04/2022 >60  >60 mL/min Final   Comment: (NOTE) Calculated using the CKD-EPI Creatinine Equation (2021)    Anion gap 07/04/2022 7  5 - 15 Final   Performed at [facility], [place] [address], [place], [place] [address]   Alcohol, Ethyl (B) 07/04/2022 <10  <10 mg/dL Final   Comment: (NOTE) Lowest detectable limit for serum alcohol is 10 mg/dL.  For medical purposes only. Performed at [facility], [place] [address] [place], [place] [address]    WBC 07/04/2022 7.0  4.0 - 10.5 K/uL Final   RBC 07/04/2022 4.18  3.87 - 5.11 MIL/uL Final   Hemoglobin 07/04/2022 12.8  12.0 - 15.0 g/dL Final   HCT 07/04/2022 39.1  36.0 - 46.0 % Final   MCV 07/04/2022 93.5  80.0 - 100.0 fL Final   MCH 07/04/2022 30.6  26.0 - 34.0 pg Final   MCHC 07/04/2022 32.7  30.0 - 36.0 g/dL Final   RDW 07/04/2022 12.9  11.5 - 15.5 % Final   Platelets 07/04/2022 243  150 - 400 K/uL Final   nRBC 07/04/2022 0.0  0.0 - 0.2 % Final   Neutrophils Relative % 07/04/2022 43  % Final   Neutro Abs 07/04/2022 3.0  1.7 - 7.7 K/uL Final   Lymphocytes Relative 07/04/2022 50  % Final   Lymphs Abs 07/04/2022 3.5  0.7 - 4.0 K/uL Final   Monocytes Relative 07/04/2022  7  % Final   Monocytes Absolute 07/04/2022 0.5  0.1 - 1.0 K/uL Final   Eosinophils Relative 07/04/2022 0  % Final   Eosinophils  Absolute 07/04/2022 0.0  0.0 - 0.5 K/uL Final   Basophils Relative 07/04/2022 0  % Final   Basophils Absolute 07/04/2022 0.0  0.0 - 0.1 K/uL Final   Immature Granulocytes 07/04/2022 0  % Final   Abs Immature Granulocytes 07/04/2022 0.01  0.00 - 0.07 K/uL Final   Performed at [facility], [place] [address] [place], [place] [address]   I-stat hCG, quantitative 07/04/2022 <5.0  <5 mIU/mL Final   Comment 3 07/04/2022          Final   Comment:   GEST. AGE      CONC.  (mIU/mL)   <=1 WEEK        5 - 50     2 WEEKS       50 - 500     3 WEEKS       100 - 10,000     4 WEEKS     1,000 - 30,000        FEMALE AND NON-PREGNANT FEMALE:     LESS THAN 5 mIU/mL   Admission on 06/14/2022, Discharged on 06/14/2022  Component Date Value Ref Range Status   Color, UA 06/14/2022 yellow  yellow Final   Clarity, UA 06/14/2022 cloudy (A)  clear Final   Glucose, UA 06/14/2022 negative  negative mg/dL Final   Bilirubin, UA 06/14/2022 negative  negative Final   Ketones, POC UA 06/14/2022 negative  negative mg/dL Final   Spec Grav, UA 06/14/2022 1.025  1.010 - 1.025 Final   Blood, UA 06/14/2022 negative  negative Final   pH, UA 06/14/2022 7.5  5.0 - 8.0 Final   Protein Ur, POC 06/14/2022 =30 (A)  negative mg/dL Final   Urobilinogen, UA 06/14/2022 0.2  0.2 or 1.0 E.U./dL Final   Nitrite, UA 06/14/2022 Negative  Negative Final   Leukocytes, UA 06/14/2022 Negative  Negative Final   Preg Test, Ur 06/14/2022 Negative  Negative Final   Specimen Description 06/14/2022 URINE, CLEAN CATCH   Final   Special Requests 06/14/2022    Final                   Value:NONE Performed at [facility], [place] [address] [place], [place] [address]    Culture 06/14/2022 MULTIPLE SPECIES PRESENT, SUGGEST RECOLLECTION (A)   Final   Report Status 06/14/2022 06/16/2022 FINAL   Final  Admission on 06/07/2022, Discharged on 06/10/2022  Component Date Value Ref Range Status   SARS Coronavirus 2 by RT PCR 06/07/2022  NEGATIVE  NEGATIVE Final   Comment: (NOTE) SARS-CoV-2 target nucleic acids are NOT DETECTED.  The SARS-CoV-2 RNA is generally detectable in upper respiratory specimens during the acute phase of infection. The lowest concentration of SARS-CoV-2 viral copies this assay can detect is 138 copies/mL. A negative result does not preclude SARS-Cov-2 infection and should not be used as the sole basis for treatment or other patient management decisions. A negative result may occur with  improper specimen collection/handling, submission of specimen other than nasopharyngeal swab, presence of viral mutation(s) within the areas targeted by this assay, and inadequate number of viral copies(<138 copies/mL). A negative result must be combined with clinical observations, patient history, and epidemiological information. The expected result is Negative.  Fact Sheet for Patients:  EntrepreneurPulse.com.au  Fact Sheet for Healthcare Providers:  IncredibleEmployment.be  This test is no                          t yet approved or cleared by the [place] and  has been authorized for detection and/or diagnosis of SARS-CoV-2 by FDA under an Emergency Use Authorization (EUA). This EUA will remain  in effect (meaning this test can be used) for the duration of the COVID-19 declaration under Section 564(b)(1) of the Act, 21 U.S.C.section 360bbb-3(b)(1), unless the authorization is terminated  or revoked sooner.       Influenza A by PCR 06/07/2022 NEGATIVE  NEGATIVE Final   Influenza B by PCR 06/07/2022 NEGATIVE  NEGATIVE Final   Comment: (NOTE) The Xpert Xpress SARS-CoV-2/FLU/RSV plus assay is intended as an aid in the diagnosis of influenza from Nasopharyngeal swab specimens and should not be used as a sole basis for treatment. Nasal washings and aspirates are unacceptable for Xpert Xpress SARS-CoV-2/FLU/RSV testing.  Fact Sheet for  Patients: EntrepreneurPulse.com.au  Fact Sheet for Healthcare Providers: IncredibleEmployment.be  This test is not yet approved or cleared by the [place] FDA and has been authorized for detection and/or diagnosis of SARS-CoV-2 by FDA under an Emergency Use Authorization (EUA). This EUA will remain in effect (meaning this test can be used) for the duration of the COVID-19 declaration under Section 564(b)(1) of the Act, 21 U.S.C. section 360bbb-3(b)(1), unless the authorization is terminated or revoked.  Performed at [facility], [place] [address] [place], [place] [address]    WBC 06/07/2022 5.7  4.0 - 10.5 K/uL Final   RBC 06/07/2022 4.39  3.87 - 5.11 MIL/uL Final   Hemoglobin 06/07/2022 13.2  12.0 - 15.0 g/dL Final   HCT 06/07/2022 40.4  36.0 - 46.0 % Final   MCV 06/07/2022 92.0  80.0 - 100.0 fL Final   MCH 06/07/2022 30.1  26.0 - 34.0 pg Final   MCHC 06/07/2022 32.7  30.0 - 36.0 g/dL Final   RDW 06/07/2022 12.4  11.5 - 15.5 % Final   Platelets 06/07/2022 308  150 - 400 K/uL Final   nRBC 06/07/2022 0.0  0.0 - 0.2 % Final   Neutrophils Relative % 06/07/2022 42  % Final   Neutro Abs 06/07/2022 2.4  1.7 - 7.7 K/uL Final   Lymphocytes Relative 06/07/2022 54  % Final   Lymphs Abs 06/07/2022 3.1  0.7 - 4.0 K/uL Final   Monocytes Relative 06/07/2022 4  % Final   Monocytes Absolute 06/07/2022 0.2  0.1 - 1.0 K/uL Final   Eosinophils Relative 06/07/2022 0  % Final   Eosinophils Absolute 06/07/2022 0.0  0.0 - 0.5 K/uL Final   Basophils Relative 06/07/2022 0  % Final   Basophils Absolute 06/07/2022 0.0  0.0 - 0.1 K/uL Final   Immature Granulocytes 06/07/2022 0  % Final   Abs Immature Granulocytes 06/07/2022 0.01  0.00 - 0.07 K/uL Final   Performed at [facility], [place] [address] [place], [place] [address]   Sodium 06/07/2022 139  135 - 145 mmol/L Final   Potassium 06/07/2022 4.0  3.5 - 5.1 mmol/L Final   Chloride 06/07/2022 104  98 -  111 mmol/L Final   CO2 06/07/2022 28  22 - 32 mmol/L Final   Glucose, Bld 06/07/2022 104 (H)  70 - 99 mg/dL Final   Glucose reference range applies only to samples taken after fasting for at least 8 hours.   BUN 06/07/2022 8  6 -  20 mg/dL Final   Creatinine, Ser 06/07/2022 0.84  0.44 - 1.00 mg/dL Final   Calcium 06/07/2022 9.1  8.9 - 10.3 mg/dL Final   Total Protein 06/07/2022 6.9  6.5 - 8.1 g/dL Final   Albumin 06/07/2022 3.7  3.5 - 5.0 g/dL Final   AST 06/07/2022 19  15 - 41 U/L Final   ALT 06/07/2022 22  0 - 44 U/L Final   Alkaline Phosphatase 06/07/2022 54  38 - 126 U/L Final   Total Bilirubin 06/07/2022 0.4  0.3 - 1.2 mg/dL Final   GFR, Estimated 06/07/2022 >60  >60 mL/min Final   Comment: (NOTE) Calculated using the CKD-EPI Creatinine Equation (2021)    Anion gap 06/07/2022 7  5 - 15 Final   Performed at [place] [address], [place], [place] [address]   Hgb A1c MFr Bld 06/07/2022 5.0  4.8 - 5.6 % Final   Comment: (NOTE) Pre diabetes:          5.7%-6.4%  Diabetes:              >6.4%  Glycemic control for   <7.0% adults with diabetes    Mean Plasma Glucose 06/07/2022 96.8  mg/dL Final   Performed at [facility], [place] [address] [place], [place] [address]   TSH 06/07/2022 1.620  0.350 - 4.500 uIU/mL Final   Comment: Performed by a 3rd Generation assay with a functional sensitivity of <=0.01 uIU/mL. Performed at [facility], [place] [address], [place], [place] [address]    RPR Ser Ql 06/07/2022 NON REACTIVE  NON REACTIVE Final   Performed at [facility], [place] [address] [place], [place] [address]   Color, Urine 06/07/2022 YELLOW  YELLOW Final   APPearance 06/07/2022 HAZY (A)  CLEAR Final   Specific Gravity, Urine 06/07/2022 1.018  1.005 - 1.030 Final   pH 06/07/2022 7.0  5.0 - 8.0 Final   Glucose, UA 06/07/2022 NEGATIVE  NEGATIVE mg/dL Final   Hgb urine dipstick 06/07/2022 NEGATIVE  NEGATIVE Final   Bilirubin Urine 06/07/2022 NEGATIVE   NEGATIVE Final   Ketones, ur 06/07/2022 NEGATIVE  NEGATIVE mg/dL Final   Protein, ur 06/07/2022 NEGATIVE  NEGATIVE mg/dL Final   Nitrite 06/07/2022 NEGATIVE  NEGATIVE Final   Leukocytes,Ua 06/07/2022 NEGATIVE  NEGATIVE Final   Performed at [facility], [place] [address] [place], [place] [address]   Cholesterol 06/07/2022 164  0 - 200 mg/dL Final   Triglycerides 06/07/2022 158 (H)  <150 mg/dL Final   HDL 06/07/2022 44  >40 mg/dL Final   Total CHOL/HDL Ratio 06/07/2022 3.7  RATIO Final   VLDL 06/07/2022 32  0 - 40 mg/dL Final   LDL Cholesterol 06/07/2022 88  0 - 99 mg/dL Final   Comment:        Total Cholesterol/HDL:CHD Risk Coronary Heart Disease Risk Table                     Men   Women  1/2 Average Risk   3.4   3.3  Average Risk       5.0   4.4  2 X Average Risk   9.6   7.1  3 X Average Risk  23.4   11.0        Use the calculated Patient Ratio above and the CHD Risk Table to determine the patient's CHD Risk.        ATP III CLASSIFICATION (LDL):  <100  mg/dL   Optimal  423-536  mg/dL   Near or Above                    Optimal  130-159  mg/dL   Borderline  144-315  mg/dL   High  >400     mg/dL   Very High Performed at [facility], [address] [place], [place] [address]    HIV Screen 4th Generation wRfx 06/07/2022 Non Reactive  Non Reactive Final   Performed at [facility], [address], [place], [place] [address]   SARSCOV2ONAVIRUS 2 AG 06/07/2022 NEGATIVE  NEGATIVE Final   Comment: (NOTE) SARS-CoV-2 antigen NOT DETECTED.   Negative results are presumptive.  Negative results do not preclude SARS-CoV-2 infection and should not be used as the sole basis for treatment or other patient management decisions, including infection  control decisions, particularly in the presence of clinical signs and  symptoms consistent with COVID-19, or in those who have been in contact with the virus.  Negative results must be combined with clinical observations,  patient history, and epidemiological information. The expected result is Negative.  Fact Sheet for Patients: https://www.jennings-kim.com/  Fact Sheet for Healthcare Providers: https://alexander-rogers.biz/  This test is not yet approved or cleared by the [place] FDA and  has been authorized for detection and/or diagnosis of SARS-CoV-2 by FDA under an Emergency Use Authorization (EUA).  This EUA will remain in effect (meaning this test can be used) for the duration of  the COV                          ID-19 declaration under Section 564(b)(1) of the Act, 21 U.S.C. section 360bbb-3(b)(1), unless the authorization is terminated or revoked sooner.     POC Amphetamine UR 06/07/2022 None Detected  NONE DETECTED (Cut Off Level 1000 ng/mL) Final   POC Secobarbital (BAR) 06/07/2022 None Detected  NONE DETECTED (Cut Off Level 300 ng/mL) Final   POC Buprenorphine (BUP) 06/07/2022 None Detected  NONE DETECTED (Cut Off Level 10 ng/mL) Final   POC Oxazepam (BZO) 06/07/2022 None Detected  NONE DETECTED (Cut Off Level 300 ng/mL) Final   POC Cocaine UR 06/07/2022 None Detected  NONE DETECTED (Cut Off Level 300 ng/mL) Final   POC Methamphetamine UR 06/07/2022 None Detected  NONE DETECTED (Cut Off Level 1000 ng/mL) Final   POC Morphine 06/07/2022 None Detected  NONE DETECTED (Cut Off Level 300 ng/mL) Final   POC Methadone UR 06/07/2022 None Detected  NONE DETECTED (Cut Off Level 300 ng/mL) Final   POC Oxycodone UR 06/07/2022 None Detected  NONE DETECTED (Cut Off Level 100 ng/mL) Final   POC Marijuana UR 06/07/2022 None Detected  NONE DETECTED (Cut Off Level 50 ng/mL) Final   Preg Test, Ur 06/08/2022 NEGATIVE  NEGATIVE Final   Comment:        THE SENSITIVITY OF THIS METHODOLOGY IS >20 mIU/mL. Performed at [facility], [address], [place], [place] [address]    Preg Test, Ur 06/08/2022 NEGATIVE  NEGATIVE Final   Comment:        THE SENSITIVITY OF  THIS METHODOLOGY IS >24 mIU/mL     Blood Alcohol level:  Lab Results  Component Value Date   ETH <10 07/04/2022   ETH <10 02/07/2022    Metabolic Disorder Labs: Lab Results  Component Value Date   HGBA1C 5.7 (H) 10/15/2022   MPG 117 10/15/2022  MPG 96.8 06/07/2022   No results found for: "PROLACTIN" Lab Results  Component Value Date   CHOL 181 07/25/2022   TRIG 118 07/25/2022   HDL 45 07/25/2022   CHOLHDL 4.0 07/25/2022   VLDL 24 07/25/2022   LDLCALC 112 (H) 07/25/2022   LDLCALC 88 06/07/2022    Therapeutic Lab Levels: No results found for: "LITHIUM" No results found for: "VALPROATE" No results found for: "CBMZ"  Physical Findings   GAD-7    Flowsheet Row Office Visit from 12/17/2021 in [facility] for [place] [facility] [place] [facility] from 10/07/2022 in [facility] ED from 06/07/2022 in [facility] Office Visit from 12/17/2021 in [facility] for [place] [facility] Total Score 0 1 2  PHQ-9 Total Score -- 2 [facility] from 10/07/2022 in [facility] Most recent reading at 11/05/2022 10:11 AM ED from 10/07/2022 in [facility] at [facility] Most recent reading at 10/07/2022 12:18 PM ED from 07/25/2022 in [facility] Most recent reading at 10/06/2022  9:38 AM  C-SSRS RISK CATEGORY No Risk No Risk No Risk        Musculoskeletal  Strength & Muscle Tone: within normal limits Gait & Station: normal Patient leans: N/A  Psychiatric Specialty Exam  Presentation  General Appearance:  Appropriate for Environment; Casual  Eye Contact: Good  Speech: Clear and Coherent; Normal Rate  Speech Volume: Normal  Handedness: Right   Mood and Affect  Mood: Irritable  Affect: Other (comment)   Thought Process  Thought  Processes: Coherent  Descriptions of Associations:Intact  Orientation:Full (Time, Place and Person)  Thought Content:Logical  Diagnosis of Schizophrenia or Schizoaffective disorder in past: Yes  Duration of Psychotic Symptoms: Greater than six months   Hallucinations:Hallucinations: None  Ideas of Reference:None  Suicidal Thoughts:Suicidal Thoughts: No  Homicidal Thoughts:Homicidal Thoughts: No   Sensorium  Memory: Remote Poor; Recent Good; Remote Good  Judgment: Good  Insight: Good   Executive Functions  Concentration: Good  Attention Span: Good  Recall: Good  Fund of Knowledge: Good  Language: Good   Psychomotor Activity  Psychomotor Activity: Psychomotor Activity: Normal   Assets  Assets: Communication Skills; Social Support; Desire for Improvement; Financial Resources/Insurance; Resilience; Physical Health   Sleep  Sleep: Sleep: Good   Nutritional Assessment (For [facility] and [facility] admissions only) Has the patient had a weight loss or gain of 10 pounds or more in the last 3 months?: No Has the patient had a decrease in food intake/or appetite?: No Does the patient have dental problems?: No Does the patient have eating habits or behaviors that may be indicators of an eating disorder including binging or inducing vomiting?: No Has the patient recently lost weight without trying?: 0 Has the patient been eating poorly because of a decreased appetite?: 0 Malnutrition Screening Tool Score: 0    Physical Exam  Physical Exam Nursing note reviewed.  Constitutional:      General: She is not in acute distress.    Appearance: Normal appearance. She is not ill-appearing.  HENT:     Head: Normocephalic.  Eyes:     General:        Right eye: No discharge.        Left eye: No discharge.     Conjunctiva/sclera: Conjunctivae  normal.  Cardiovascular:     Rate and Rhythm: Normal rate.  Pulmonary:     Effort: Pulmonary effort is normal. No respiratory  distress.  Musculoskeletal:        General: Normal range of motion.     Cervical back: Normal range of motion.  Skin:    Coloration: Skin is not jaundiced or pale.  Neurological:     Mental Status: She is alert and oriented to person, place, and time.  Psychiatric:        Attention and Perception: Attention normal. She perceives auditory hallucinations.        Mood and Affect: Mood and affect normal.        Speech: Speech normal.        Behavior: Behavior normal.        Thought Content: Thought content normal.        Cognition and Memory: Cognition normal.        Judgment: Judgment normal.    Review of Systems  Constitutional: Negative.   HENT: Negative.    Eyes: Negative.   Respiratory: Negative.    Cardiovascular: Negative.   Musculoskeletal: Negative.   Skin: Negative.   Neurological: Negative.   Psychiatric/Behavioral: Negative.     Blood pressure 102/77, pulse (!) 102, temperature 98 F (36.7 C), temperature source Oral, resp. rate 18, height 5\' 1"  (1.549 m), weight 198 lb (89.8 kg), SpO2 98 %. Body mass index is 37.41 kg/m.  Treatment Plan Summary:  Daily contact with patient to assess and evaluate symptoms and progress in treatment and Medication management    Pt  has been cleared by psychiatry. TOC team continues to seek disposition.  Revonda Humphrey, NP 11/05/2022 9:44 PM

## 2022-11-05 NOTE — ED Notes (Signed)
Pt resting at this hour. No apparent distress. RR even and unlabored. Pt was upset because she feels a member of staff was upset with her. Pt given support and encouragement. Pt assisted with coping skills to deal with other people's feelings. Pt endorsed some anxiety about it. Monitored for safety.

## 2022-11-05 NOTE — ED Notes (Signed)
.  Patient alert and oriented x 3. Denies SI/HI/AVH. Denies intent or plan to harm self or others. Routine conducted according to faculty protocol. Encourage patient to notify staff with any needs or concerns. Patient verbalized agreement and understanding. Will continue to monitor for safety. 

## 2022-11-05 NOTE — ED Notes (Signed)
Patient alert and oriented x 3. Denies SI/HI/AVH. Denies intent or plan to harm self or others. Routine conducted according to faculty protocol. Encourage patient to notify staff with any needs or concerns. Patient verbalized agreement and understanding. Will continue to monitor for safety. 

## 2022-11-05 NOTE — Progress Notes (Signed)
[name] Progress Note   Pt requested [name] to assist her in completing an online application for independent living apartments with [facility].  [name] encouraged pt to complete the paper copy of the application she had in her possession so the information could be transposed into the online application.  [name] sent an email to pt's new guardian, Mr. Marcy Salvo, requesting that he send a copy of the paperwork showing transfer of guardianship to him so it will be uploaded into the pt's EMR.   Omelia Blackwater, MSW, [employer] [place] phone [phone] fax

## 2022-11-05 NOTE — ED Notes (Signed)
Patient  sleeping in no acute stress. RR even and unlabored .Environment secured .Will continue to monitor for safely. 

## 2022-11-06 DIAGNOSIS — N390 Urinary tract infection, site not specified: Secondary | ICD-10-CM | POA: Diagnosis not present

## 2022-11-06 DIAGNOSIS — N9489 Other specified conditions associated with female genital organs and menstrual cycle: Secondary | ICD-10-CM | POA: Diagnosis not present

## 2022-11-06 DIAGNOSIS — F419 Anxiety disorder, unspecified: Secondary | ICD-10-CM | POA: Diagnosis not present

## 2022-11-06 DIAGNOSIS — E118 Type 2 diabetes mellitus with unspecified complications: Secondary | ICD-10-CM | POA: Diagnosis not present

## 2022-11-06 DIAGNOSIS — F209 Schizophrenia, unspecified: Secondary | ICD-10-CM | POA: Diagnosis not present

## 2022-11-06 DIAGNOSIS — F17200 Nicotine dependence, unspecified, uncomplicated: Secondary | ICD-10-CM | POA: Diagnosis not present

## 2022-11-06 DIAGNOSIS — E039 Hypothyroidism, unspecified: Secondary | ICD-10-CM | POA: Diagnosis not present

## 2022-11-06 DIAGNOSIS — Z1152 Encounter for screening for COVID-19: Secondary | ICD-10-CM | POA: Diagnosis not present

## 2022-11-06 DIAGNOSIS — F333 Major depressive disorder, recurrent, severe with psychotic symptoms: Secondary | ICD-10-CM | POA: Diagnosis not present

## 2022-11-06 NOTE — ED Notes (Signed)
Patient resting quietly in bed with eyes closed. Respirations equal and unlabored, skin warm and dry, NAD. Routine safety checks conducted according to facility protocol. Will continue to monitor for safety.  

## 2022-11-06 NOTE — ED Provider Notes (Signed)
Behavioral Health Progress Note  Date and Time: 11/06/2022 3:03 PM Name: Nicole [name] MRN:  [id number]  Nicole [name] is a 32 y/o female with PMH of SCZ unspecified, MDD with psychosis, who presented voluntarily to [facility] (07/23/2022) via [employer] after a verbal altercation with Nicole [name] (not patient's legal guardian) for transient SI. This is patient's fourth visit to [facility] for similar concerns this year. Patient has been dismissed from previous group home due to threatening SI and HI, then leaving the group home. 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  Subjective:    Pt seen on unit, resting comfortably on reclining chair bed. Pt reports "tired" mood. Her affect is blunted. She denies suicidal, homicidal or violent ideation. She denies auditory visual hallucinations or paranoia. She denies any psychiatric or physical complaints. She states she is looking into independent living facilities as well for placement. Pt remains psychiatrically cleared. Pt remains boarding until placement is found. Treatment team meeting on 11/07/22. TOC/DSS is seeking respite ALF. There is a longer-term ALF that is willing to accept [name] but that ALF is not registered in [place] tracks and this process will take 4 to 6 weeks.  Diagnosis:  Final diagnoses:  Tobacco use disorder  Schizophrenia, unspecified type (HCC)  MDD (major depressive disorder), recurrent episode, moderate (HCC)  Hypothyroidism, unspecified type    Total Time spent with patient: 15 minutes  Past Psychiatric History: SCZ unspecified, MDD with psychosis, anxiety, depression, tobacco use Past Medical History: Hypothroidism Family History: Hypertension - father diabetes  Family Psychiatric  History: See H&P Social History: See H&P   Additional Social History:   Sleep: Good  Appetite:  Good  Current Medications:  Current Facility-Administered Medications  Medication Dose Route Frequency Provider Last Rate Last Admin    acetaminophen (TYLENOL) tablet 650 mg  650 mg Oral Q6H PRN Marlou Sa, NP   650 mg at 11/04/22 2154   alum & mag hydroxide-simeth (MAALOX/MYLANTA) 200-200-20 MG/5ML suspension 30 mL  30 mL Oral Q4H PRN Marlou Sa, NP   30 mL at 10/26/22 1755   ARIPiprazole ER (ABILIFY MAINTENA) 400 MG prefilled syringe 400 mg  400 mg Intramuscular Q28 days Princess Bruins, DO   400 mg at 10/25/22 0920   cetaphil lotion   Topical PRN Princess Bruins, DO       fluticasone [facility]) 50 MCG/ACT nasal spray 1 spray  1 spray Each Nare QHS Princess Bruins, DO   1 spray at 11/05/22 2119   hydrOXYzine (ATARAX) tablet 25 mg  25 mg Oral TID PRN Lorri Frederick, MD   25 mg at 11/05/22 2119   ketoconazole (NIZORAL) 2 % cream   Topical BID Princess Bruins, DO   1 Application at 11/06/22 7829   levothyroxine (SYNTHROID) tablet 100 mcg  100 mcg Oral Once Byungura, Veronique M, NP       levothyroxine (SYNTHROID) tablet 100 mcg  100 mcg Oral Q0600 Marlou Sa, NP   100 mcg at 11/06/22 0612   lidocaine (LIDODERM) 5 % 1 patch  1 patch Transdermal Q24H Princess Bruins, DO   1 patch at 11/06/22 0917   loratadine (CLARITIN) tablet 10 mg  10 mg Oral Daily Carrion-Carrero, Karle Starch, MD   10 mg at 11/06/22 0914   magnesium hydroxide (MILK OF MAGNESIA) suspension 30 mL  30 mL Oral Daily PRN Marlou Sa, NP   30 mL at 10/12/22 2145   metFORMIN (GLUCOPHAGE) tablet 500 mg  500 mg Oral Q breakfast Carrion-Carrero,  Margely, MD   500 mg at 11/06/22 [id number]   nicotine (NICODERM CQ - dosed in mg/24 hours) patch 14 mg  14 mg Transdermal Daily PRN Carrion-Carrero, Karle StarchMargely, MD       ondansetron (ZOFRAN-ODT) disintegrating tablet 4 mg  4 mg Oral Q8H PRN Carrion-Carrero, Margely, MD   4 mg at 11/01/22 2246   Oxcarbazepine (TRILEPTAL) tablet 300 mg  300 mg Oral BID Byungura, Veronique M, NP   300 mg at 11/06/22 0914   pantoprazole (PROTONIX) EC tablet 40 mg  40 mg Oral Daily Carrion-Carrero, Margely, MD   40 mg at  11/06/22 0915   polyethylene glycol (MIRALAX / GLYCOLAX) packet 17 g  17 g Oral Daily Princess Bruinsguyen, Julie, DO   17 g at 11/06/22 0912   QUEtiapine (SEROQUEL) tablet 400 mg  400 mg Oral BID Rayburn GoByungura, Veronique M, NP   400 mg at 11/06/22 0913   sertraline (ZOLOFT) tablet 150 mg  150 mg Oral Daily Carrion-Carrero, Margely, MD   150 mg at 11/06/22 0915   sodium chloride (OCEAN) 0.65 % nasal spray 1 spray  1 spray Each Nare Daily Princess BruinsNguyen, Julie, DO   1 spray at 11/06/22 0918   traZODone (DESYREL) tablet 100 mg  100 mg Oral QHS Olin PiaByungura, Veronique M, NP   100 mg at 11/05/22 2119   traZODone (DESYREL) tablet 50 mg  50 mg Oral QHS PRN Marlou SaByungura, Veronique M, NP   50 mg at 11/03/22 2117   valACYclovir (VALTREX) tablet 500 mg  500 mg Oral Daily Carrion-Carrero, Karle StarchMargely, MD   500 mg at 11/06/22 0914   zinc oxide (BALMEX) 11.3 % cream   Topical PRN Princess BruinsNguyen, Julie, DO       Current Outpatient Medications  Medication Sig Dispense Refill   ABILIFY MAINTENA 400 MG PRSY prefilled syringe 400 mg every 28 (twenty-eight) days.     cetirizine (ZYRTEC) 10 MG tablet Take 10 mg by mouth daily.     cyclobenzaprine (FLEXERIL) 10 MG tablet Take 1 tablet (10 mg total) by mouth 2 (two) times daily as needed for muscle spasms. 20 tablet 0   fluticasone (FLONASE) 50 MCG/ACT nasal spray Place 1 spray into both nostrils daily.     meloxicam (MOBIC) 15 MG tablet Take 15 mg by mouth daily.     nitrofurantoin, macrocrystal-monohydrate, (MACROBID) 100 MG capsule Take 1 capsule (100 mg total) by mouth 2 (two) times daily. 10 capsule 0   ondansetron (ZOFRAN-ODT) 4 MG disintegrating tablet Take 1 tablet (4 mg total) by mouth every 8 (eight) hours as needed for nausea or vomiting. 20 tablet 0   Oxcarbazepine (TRILEPTAL) 300 MG tablet Take 1 tablet (300 mg total) by mouth 2 (two) times daily. 60 tablet 0   QUEtiapine (SEROQUEL) 400 MG tablet Take 1 tablet (400 mg total) by mouth 2 (two) times daily. 60 tablet 0   sertraline (ZOLOFT) 50 MG tablet  Take 3 tablets (150 mg total) by mouth in the morning. 90 tablet 0   traZODone (DESYREL) 100 MG tablet Take 1 tablet (100 mg total) by mouth at bedtime. 30 tablet 0   valACYclovir (VALTREX) 500 MG tablet Take 500 mg by mouth daily.      Labs  Lab Results:  Admission on 10/07/2022  Component Date Value Ref Range Status   Hgb A1c MFr Bld 10/15/2022 5.7 (H)  4.8 - 5.6 % Final   Comment: (NOTE)         Prediabetes: 5.7 - 6.4  Diabetes: >6.4         Glycemic control for adults with diabetes: <7.0    Mean Plasma Glucose 10/15/2022 117  mg/dL Final   Comment: (NOTE) Performed At: [facility] [address] [place], [place] [id number] Jolene SchimkeNagendra Sanjai MD AO:[id number]:[phone]   Office Visit on 10/21/2022  Component Date Value Ref Range Status   SARSCOV2ONAVIRUS 2 AG 11/01/2022 NEGATIVE  NEGATIVE Final   Comment: (NOTE) SARS-CoV-2 antigen NOT DETECTED.   Negative results are presumptive.  Negative results do not preclude SARS-CoV-2 infection and should not be used as the sole basis for treatment or other patient management decisions, including infection  control decisions, particularly in the presence of clinical signs and  symptoms consistent with COVID-19, or in those who have been in contact with the virus.  Negative results must be combined with clinical observations, patient history, and epidemiological information. The expected result is Negative.  Fact Sheet for Patients: https://www.jennings-kim.com/https://www.fda.gov/media/141569/download  Fact Sheet for Healthcare Providers: https://alexander-rogers.biz/https://www.fda.gov/media/141568/download  This test is not yet approved or cleared by the [place] FDA and  has been authorized for detection and/or diagnosis of SARS-CoV-2 by FDA under an Emergency Use Authorization (EUA).  This EUA will remain in effect (meaning this test can be used) for the duration of  the COV                          ID-19 declaration under Section 564(b)(1) of the Act, 21 U.S.C. section  360bbb-3(b)(1), unless the authorization is terminated or revoked sooner.    Admission on 10/07/2022, Discharged on 10/07/2022  Component Date Value Ref Range Status   Color, Urine 10/07/2022 YELLOW  YELLOW Final   APPearance 10/07/2022 HAZY (A)  CLEAR Final   Specific Gravity, Urine 10/07/2022 1.011  1.005 - 1.030 Final   pH 10/07/2022 7.0  5.0 - 8.0 Final   Glucose, UA 10/07/2022 NEGATIVE  NEGATIVE mg/dL Final   Hgb urine dipstick 10/07/2022 SMALL (A)  NEGATIVE Final   Bilirubin Urine 10/07/2022 NEGATIVE  NEGATIVE Final   Ketones, ur 10/07/2022 NEGATIVE  NEGATIVE mg/dL Final   Protein, ur 96/29/528412/25/2023 NEGATIVE  NEGATIVE mg/dL Final   Nitrite 13/24/401012/25/2023 NEGATIVE  NEGATIVE Final   Leukocytes,Ua 10/07/2022 LARGE (A)  NEGATIVE Final   RBC / HPF 10/07/2022 0-5  0 - 5 RBC/hpf Final   WBC, UA 10/07/2022 0-5  0 - 5 WBC/hpf Final   Bacteria, UA 10/07/2022 FEW (A)  NONE SEEN Final   Squamous Epithelial / HPF 10/07/2022 11-20  0 - 5 Final   Performed at [facility], [address] [place], [place] [address]   I-stat hCG, quantitative 10/07/2022 <5.0  <5 mIU/mL Final   Comment 3 10/07/2022          Final   Comment:   GEST. AGE      CONC.  (mIU/mL)   <=1 WEEK        5 - 50     2 WEEKS       50 - 500     3 WEEKS       100 - 10,000     4 WEEKS     1,000 - 30,000        FEMALE AND NON-PREGNANT FEMALE:     LESS THAN 5 mIU/mL    Sodium 10/07/2022 138  135 - 145 mmol/L Final   Potassium 10/07/2022 4.3  3.5 - 5.1 mmol/L Final   Chloride 10/07/2022 104  98 - 111 mmol/L Final   CO2 10/07/2022 28  22 - 32 mmol/L Final   Glucose, Bld 10/07/2022 90  70 - 99 mg/dL Final   Glucose reference range applies only to samples taken after fasting for at least 8 hours.   BUN 10/07/2022 8  6 - 20 mg/dL Final   Creatinine, Ser 10/07/2022 0.96  0.44 - 1.00 mg/dL Final   Calcium 70/26/3785 9.1  8.9 - 10.3 mg/dL Final   GFR, Estimated 10/07/2022 >60  >60 mL/min Final   Comment: (NOTE) Calculated using the  CKD-EPI Creatinine Equation (2021)    Anion gap 10/07/2022 6  5 - 15 Final   Performed at [facility], [address] [place], [place] [address]   WBC 10/07/2022 5.8  4.0 - 10.5 K/uL Final   RBC 10/07/2022 4.36  3.87 - 5.11 MIL/uL Final   Hemoglobin 10/07/2022 12.9  12.0 - 15.0 g/dL Final   HCT 77/41/2878 40.0  36.0 - 46.0 % Final   MCV 10/07/2022 91.7  80.0 - 100.0 fL Final   MCH 10/07/2022 29.6  26.0 - 34.0 pg Final   MCHC 10/07/2022 32.3  30.0 - 36.0 g/dL Final   RDW 67/67/2094 12.4  11.5 - 15.5 % Final   Platelets 10/07/2022 295  150 - 400 K/uL Final   nRBC 10/07/2022 0.0  0.0 - 0.2 % Final   Performed at [facility], [address], [place], [place] [address]   SARS Coronavirus 2 by RT PCR 10/07/2022 NEGATIVE  NEGATIVE Final   Comment: (NOTE) SARS-CoV-2 target nucleic acids are NOT DETECTED.  The SARS-CoV-2 RNA is generally detectable in upper respiratory specimens during the acute phase of infection. The lowest concentration of SARS-CoV-2 viral copies this assay can detect is 138 copies/mL. A negative result does not preclude SARS-Cov-2 infection and should not be used as the sole basis for treatment or other patient management decisions. A negative result may occur with  improper specimen collection/handling, submission of specimen other than nasopharyngeal swab, presence of viral mutation(s) within the areas targeted by this assay, and inadequate number of viral copies(<138 copies/mL). A negative result must be combined with clinical observations, patient history, and epidemiological information. The expected result is Negative.  Fact Sheet for Patients:  BloggerCourse.com  Fact Sheet for Healthcare Providers:  SeriousBroker.it  This test is no                          t yet approved or cleared by the [place] FDA and  has been authorized for detection and/or diagnosis of SARS-CoV-2 by FDA under an  Emergency Use Authorization (EUA). This EUA will remain  in effect (meaning this test can be used) for the duration of the COVID-19 declaration under Section 564(b)(1) of the Act, 21 U.S.C.section 360bbb-3(b)(1), unless the authorization is terminated  or revoked sooner.       Influenza A by PCR 10/07/2022 NEGATIVE  NEGATIVE Final   Influenza B by PCR 10/07/2022 NEGATIVE  NEGATIVE Final   Comment: (NOTE) The Xpert Xpress SARS-CoV-2/FLU/RSV plus assay is intended as an aid in the diagnosis of influenza from Nasopharyngeal swab specimens and should not be used as a sole basis for treatment. Nasal washings and aspirates are unacceptable for Xpert Xpress SARS-CoV-2/FLU/RSV testing.  Fact Sheet for Patients: BloggerCourse.com  Fact Sheet for Healthcare Providers: SeriousBroker.it  This test is not yet approved or cleared by the [place] FDA and has  been authorized for detection and/or diagnosis of SARS-CoV-2 by FDA under an Emergency Use Authorization (EUA). This EUA will remain in effect (meaning this test can be used) for the duration of the COVID-19 declaration under Section 564(b)(1) of the Act, 21 U.S.C. section 360bbb-3(b)(1), unless the authorization is terminated or revoked.     Resp Syncytial Virus by PCR 10/07/2022 NEGATIVE  NEGATIVE Final   Comment: (NOTE) Fact Sheet for Patients: BloggerCourse.com  Fact Sheet for Healthcare Providers: SeriousBroker.it  This test is not yet approved or cleared by the [place] FDA and has been authorized for detection and/or diagnosis of SARS-CoV-2 by FDA under an Emergency Use Authorization (EUA). This EUA will remain in effect (meaning this test can be used) for the duration of the COVID-19 declaration under Section 564(b)(1) of the Act, 21 U.S.C. section 360bbb-3(b)(1), unless the authorization is terminated  or revoked.  Performed at [facility], [address], [place], [place] [address]    Specimen Description 10/07/2022 URINE, CLEAN CATCH   Final   Special Requests 10/07/2022    Final                   Value:NONE Performed at [facility], [address], [place], [place] [address]    Culture 10/07/2022 MULTIPLE SPECIES PRESENT, SUGGEST RECOLLECTION (A)   Final   Report Status 10/07/2022 10/08/2022 FINAL   Final  Admission on 07/25/2022, Discharged on 10/07/2022  Component Date Value Ref Range Status   SARS Coronavirus 2 by RT PCR 07/25/2022 NEGATIVE  NEGATIVE Final   Comment: (NOTE) SARS-CoV-2 target nucleic acids are NOT DETECTED.  The SARS-CoV-2 RNA is generally detectable in upper respiratory specimens during the acute phase of infection. The lowest concentration of SARS-CoV-2 viral copies this assay can detect is 138 copies/mL. A negative result does not preclude SARS-Cov-2 infection and should not be used as the sole basis for treatment or other patient management decisions. A negative result may occur with  improper specimen collection/handling, submission of specimen other than nasopharyngeal swab, presence of viral mutation(s) within the areas targeted by this assay, and inadequate number of viral copies(<138 copies/mL). A negative result must be combined with clinical observations, patient history, and epidemiological information. The expected result is Negative.  Fact Sheet for Patients:  BloggerCourse.com  Fact Sheet for Healthcare Providers:  SeriousBroker.it  This test is no                          t yet approved or cleared by the [place] FDA and  has been authorized for detection and/or diagnosis of SARS-CoV-2 by FDA under an Emergency Use Authorization (EUA). This EUA will remain  in effect (meaning this test can be used) for the duration of the COVID-19 declaration under Section 564(b)(1)  of the Act, 21 U.S.C.section 360bbb-3(b)(1), unless the authorization is terminated  or revoked sooner.       Influenza A by PCR 07/25/2022 NEGATIVE  NEGATIVE Final   Influenza B by PCR 07/25/2022 NEGATIVE  NEGATIVE Final   Comment: (NOTE) The Xpert Xpress SARS-CoV-2/FLU/RSV plus assay is intended as an aid in the diagnosis of influenza from Nasopharyngeal swab specimens and should not be used as a sole basis for treatment. Nasal washings and aspirates are unacceptable for Xpert Xpress SARS-CoV-2/FLU/RSV testing.  Fact Sheet for Patients: BloggerCourse.com  Fact Sheet for Healthcare Providers: SeriousBroker.it  This test is not yet approved or cleared by the [place]  FDA and has been authorized for detection and/or diagnosis of SARS-CoV-2 by FDA under an Emergency Use Authorization (EUA). This EUA will remain in effect (meaning this test can be used) for the duration of the COVID-19 declaration under Section 564(b)(1) of the Act, 21 U.S.C. section 360bbb-3(b)(1), unless the authorization is terminated or revoked.  Performed at [facility], [address] [place], [place] [address]    WBC 07/25/2022 8.3  4.0 - 10.5 K/uL Final   RBC 07/25/2022 4.44  3.87 - 5.11 MIL/uL Final   Hemoglobin 07/25/2022 13.7  12.0 - 15.0 g/dL Final   HCT 84/13/2440 40.2  36.0 - 46.0 % Final   MCV 07/25/2022 90.5  80.0 - 100.0 fL Final   MCH 07/25/2022 30.9  26.0 - 34.0 pg Final   MCHC 07/25/2022 34.1  30.0 - 36.0 g/dL Final   RDW 08/10/2535 12.2  11.5 - 15.5 % Final   Platelets 07/25/2022 248  150 - 400 K/uL Final   nRBC 07/25/2022 0.0  0.0 - 0.2 % Final   Neutrophils Relative % 07/25/2022 43  % Final   Neutro Abs 07/25/2022 3.6  1.7 - 7.7 K/uL Final   Lymphocytes Relative 07/25/2022 52  % Final   Lymphs Abs 07/25/2022 4.2 (H)  0.7 - 4.0 K/uL Final   Monocytes Relative 07/25/2022 5  % Final   Monocytes Absolute 07/25/2022 0.4  0.1 -  1.0 K/uL Final   Eosinophils Relative 07/25/2022 0  % Final   Eosinophils Absolute 07/25/2022 0.0  0.0 - 0.5 K/uL Final   Basophils Relative 07/25/2022 0  % Final   Basophils Absolute 07/25/2022 0.0  0.0 - 0.1 K/uL Final   Immature Granulocytes 07/25/2022 0  % Final   Abs Immature Granulocytes 07/25/2022 0.02  0.00 - 0.07 K/uL Final   Performed at [facility], [address], [place], [place] [address]   Sodium 07/25/2022 138  135 - 145 mmol/L Final   Potassium 07/25/2022 4.0  3.5 - 5.1 mmol/L Final   Chloride 07/25/2022 104  98 - 111 mmol/L Final   CO2 07/25/2022 29  22 - 32 mmol/L Final   Glucose, Bld 07/25/2022 83  70 - 99 mg/dL Final   Glucose reference range applies only to samples taken after fasting for at least 8 hours.   BUN 07/25/2022 11  6 - 20 mg/dL Final   Creatinine, Ser 07/25/2022 0.97  0.44 - 1.00 mg/dL Final   Calcium 47/42/5956 9.2  8.9 - 10.3 mg/dL Final   Total Protein 38/75/6433 7.0  6.5 - 8.1 g/dL Final   Albumin 29/51/8841 3.8  3.5 - 5.0 g/dL Final   AST 66/03/3015 18  15 - 41 U/L Final   ALT 07/25/2022 22  0 - 44 U/L Final   Alkaline Phosphatase 07/25/2022 64  38 - 126 U/L Final   Total Bilirubin 07/25/2022 0.2 (L)  0.3 - 1.2 mg/dL Final   GFR, Estimated 07/25/2022 >60  >60 mL/min Final   Comment: (NOTE) Calculated using the CKD-EPI Creatinine Equation (2021)    Anion gap 07/25/2022 5  5 - 15 Final   Performed at [facility], [address], [place], [place] [address]   POC Amphetamine UR 07/25/2022 None Detected  NONE DETECTED (Cut Off Level 1000 ng/mL) Preliminary   POC Secobarbital (BAR) 07/25/2022 None Detected  NONE DETECTED (Cut Off Level 300 ng/mL) Preliminary   POC Buprenorphine (BUP) 07/25/2022 None Detected  NONE DETECTED (Cut Off Level 10 ng/mL)  Preliminary   POC Oxazepam (BZO) 07/25/2022 None Detected  NONE DETECTED (Cut Off Level 300 ng/mL) Preliminary   POC Cocaine UR 07/25/2022 None Detected  NONE DETECTED (Cut Off Level 300 ng/mL)  Preliminary   POC Methamphetamine UR 07/25/2022 None Detected  NONE DETECTED (Cut Off Level 1000 ng/mL) Preliminary   POC Morphine 07/25/2022 None Detected  NONE DETECTED (Cut Off Level 300 ng/mL) Preliminary   POC Methadone UR 07/25/2022 None Detected  NONE DETECTED (Cut Off Level 300 ng/mL) Preliminary   POC Oxycodone UR 07/25/2022 None Detected  NONE DETECTED (Cut Off Level 100 ng/mL) Preliminary   POC Marijuana UR 07/25/2022 None Detected  NONE DETECTED (Cut Off Level 50 ng/mL) Preliminary   SARSCOV2ONAVIRUS 2 AG 07/25/2022 NEGATIVE  NEGATIVE Final   Comment: (NOTE) SARS-CoV-2 antigen NOT DETECTED.   Negative results are presumptive.  Negative results do not preclude SARS-CoV-2 infection and should not be used as the sole basis for treatment or other patient management decisions, including infection  control decisions, particularly in the presence of clinical signs and  symptoms consistent with COVID-19, or in those who have been in contact with the virus.  Negative results must be combined with clinical observations, patient history, and epidemiological information. The expected result is Negative.  Fact Sheet for Patients: https://www.jennings-kim.com/https://www.fda.gov/media/141569/download  Fact Sheet for Healthcare Providers: https://alexander-rogers.biz/https://www.fda.gov/media/141568/download  This test is not yet approved or cleared by the [place] FDA and  has been authorized for detection and/or diagnosis of SARS-CoV-2 by FDA under an Emergency Use Authorization (EUA).  This EUA will remain in effect (meaning this test can be used) for the duration of  the COV                          ID-19 declaration under Section 564(b)(1) of the Act, 21 U.S.C. section 360bbb-3(b)(1), unless the authorization is terminated or revoked sooner.     Cholesterol 07/25/2022 181  0 - 200 mg/dL Final   Triglycerides 16/10/960410/09/2022 118  <150 mg/dL Final   HDL 54/09/811910/09/2022 45  >40 mg/dL Final   Total CHOL/HDL Ratio 07/25/2022 4.0  RATIO Final    VLDL 07/25/2022 24  0 - 40 mg/dL Final   LDL Cholesterol 07/25/2022 112 (H)  0 - 99 mg/dL Final   Comment:        Total Cholesterol/HDL:CHD Risk Coronary Heart Disease Risk Table                     Men   Women  1/2 Average Risk   3.4   3.3  Average Risk       5.0   4.4  2 X Average Risk   9.6   7.1  3 X Average Risk  23.4   11.0        Use the calculated Patient Ratio above and the CHD Risk Table to determine the patient's CHD Risk.        ATP III CLASSIFICATION (LDL):  <100     mg/dL   Optimal  [id number]  mg/dL   Near or Above                    Optimal  130-159  mg/dL   Borderline  [id number]  mg/dL   High  >865>190     mg/dL   Very High Performed at [facility], [address] [place], [place] [address]    TSH 07/25/2022 6.668 (H)  0.350 -  4.500 uIU/mL Final   Comment: Performed by a 3rd Generation assay with a functional sensitivity of <=0.01 uIU/mL. Performed at [facility], [address] [place], [place] [address]    Glucose-Capillary 07/26/2022 104 (H)  70 - 99 mg/dL Final   Glucose reference range applies only to samples taken after fasting for at least 8 hours.   T3, Free 07/31/2022 2.3  2.0 - 4.4 pg/mL Final   Comment: (NOTE) Performed At: [facility] [address] [place], [place] [id number] Jolene SchimkeNagendra Sanjai MD AO:[id number]:[phone]    Free T4 07/31/2022 0.60 (L)  0.61 - 1.12 ng/dL Final   Comment: (NOTE) Biotin ingestion may interfere with free T4 tests. If the results are inconsistent with the TSH level, previous test results, or the clinical presentation, then consider biotin interference. If needed, order repeat testing after stopping biotin. Performed at [facility], [address] [place], [place] [address]    Glucose-Capillary 08/29/2022 100 (H)  70 - 99 mg/dL Final   Glucose reference range applies only to samples taken after fasting for at least 8 hours.   TSH 09/05/2022 0.793  0.350 - 4.500 uIU/mL Final   Comment: Performed by a  3rd Generation assay with a functional sensitivity of <=0.01 uIU/mL. Performed at [facility], [address] [place], [place] [address]    Free T4 09/05/2022 0.73  0.61 - 1.12 ng/dL Final   Comment: (NOTE) Biotin ingestion may interfere with free T4 tests. If the results are inconsistent with the TSH level, previous test results, or the clinical presentation, then consider biotin interference. If needed, order repeat testing after stopping biotin. Performed at [facility], [address] [place], [place] [address]    Preg Test, Ur 09/06/2022 Negative  Negative Final   Preg Test, Ur 09/06/2022 NEGATIVE  NEGATIVE Final   Comment:        THE SENSITIVITY OF THIS METHODOLOGY IS >24 mIU/mL    Preg Test, Ur 09/05/2022 NEGATIVE  NEGATIVE Final   Comment:        THE SENSITIVITY OF THIS METHODOLOGY IS >24 mIU/mL    Sodium 09/13/2022 136  135 - 145 mmol/L Final   Potassium 09/13/2022 4.5  3.5 - 5.1 mmol/L Final   Chloride 09/13/2022 105  98 - 111 mmol/L Final   CO2 09/13/2022 21 (L)  22 - 32 mmol/L Final   Glucose, Bld 09/13/2022 111 (H)  70 - 99 mg/dL Final   Glucose reference range applies only to samples taken after fasting for at least 8 hours.   BUN 09/13/2022 14  6 - 20 mg/dL Final   Creatinine, Ser 09/13/2022 0.92  0.44 - 1.00 mg/dL Final   Calcium 02/72/536612/10/2021 9.1  8.9 - 10.3 mg/dL Final   GFR, Estimated 09/13/2022 >60  >60 mL/min Final   Comment: (NOTE) Calculated using the CKD-EPI Creatinine Equation (2021)    Anion gap 09/13/2022 10  5 - 15 Final   Performed at [facility], [address] [place], [place] [address]   Vitamin B-12 09/13/2022 585  180 - 914 pg/mL Final   Comment: (NOTE) This assay is not validated for testing neonatal or myeloproliferative syndrome specimens for Vitamin B12 levels. Performed at [facility], [address] [place], [place] [address]    Color, Urine 09/14/2022 YELLOW  YELLOW Final   APPearance 09/14/2022 HAZY  (A)  CLEAR Final   Specific Gravity, Urine 09/14/2022 1.025  1.005 - 1.030 Final  pH 09/14/2022 5.0  5.0 - 8.0 Final   Glucose, UA 09/14/2022 NEGATIVE  NEGATIVE mg/dL Final   Hgb urine dipstick 09/14/2022 NEGATIVE  NEGATIVE Final   Bilirubin Urine 09/14/2022 NEGATIVE  NEGATIVE Final   Ketones, ur 09/14/2022 NEGATIVE  NEGATIVE mg/dL Final   Protein, ur 96/29/5284 NEGATIVE  NEGATIVE mg/dL Final   Nitrite 13/24/4010 NEGATIVE  NEGATIVE Final   Leukocytes,Ua 09/14/2022 TRACE (A)  NEGATIVE Final   RBC / HPF 09/14/2022 0-5  0 - 5 RBC/hpf Final   WBC, UA 09/14/2022 0-5  0 - 5 WBC/hpf Final   Bacteria, UA 09/14/2022 RARE (A)  NONE SEEN Final   Squamous Epithelial / HPF 09/14/2022 0-5  0 - 5 Final   Mucus 09/14/2022 PRESENT   Final   Performed at [facility], [address], [place], [place] [address]   Glucose-Capillary 09/16/2022 105 (H)  70 - 99 mg/dL Final   Glucose reference range applies only to samples taken after fasting for at least 8 hours.   SARS Coronavirus 2 by RT PCR 09/30/2022 NEGATIVE  NEGATIVE Final   Comment: (NOTE) SARS-CoV-2 target nucleic acids are NOT DETECTED.  The SARS-CoV-2 RNA is generally detectable in upper respiratory specimens during the acute phase of infection. The lowest concentration of SARS-CoV-2 viral copies this assay can detect is 138 copies/mL. A negative result does not preclude SARS-Cov-2 infection and should not be used as the sole basis for treatment or other patient management decisions. A negative result may occur with  improper specimen collection/handling, submission of specimen other than nasopharyngeal swab, presence of viral mutation(s) within the areas targeted by this assay, and inadequate number of viral copies(<138 copies/mL). A negative result must be combined with clinical observations, patient history, and epidemiological information. The expected result is Negative.  Fact Sheet for Patients:   BloggerCourse.com  Fact Sheet for Healthcare Providers:  SeriousBroker.it  This test is no                          t yet approved or cleared by the [place] FDA and  has been authorized for detection and/or diagnosis of SARS-CoV-2 by FDA under an Emergency Use Authorization (EUA). This EUA will remain  in effect (meaning this test can be used) for the duration of the COVID-19 declaration under Section 564(b)(1) of the Act, 21 U.S.C.section 360bbb-3(b)(1), unless the authorization is terminated  or revoked sooner.       Influenza A by PCR 09/30/2022 NEGATIVE  NEGATIVE Final   Influenza B by PCR 09/30/2022 NEGATIVE  NEGATIVE Final   Comment: (NOTE) The Xpert Xpress SARS-CoV-2/FLU/RSV plus assay is intended as an aid in the diagnosis of influenza from Nasopharyngeal swab specimens and should not be used as a sole basis for treatment. Nasal washings and aspirates are unacceptable for Xpert Xpress SARS-CoV-2/FLU/RSV testing.  Fact Sheet for Patients: BloggerCourse.com  Fact Sheet for Healthcare Providers: SeriousBroker.it  This test is not yet approved or cleared by the [place] FDA and has been authorized for detection and/or diagnosis of SARS-CoV-2 by FDA under an Emergency Use Authorization (EUA). This EUA will remain in effect (meaning this test can be used) for the duration of the COVID-19 declaration under Section 564(b)(1) of the Act, 21 U.S.C. section 360bbb-3(b)(1), unless the authorization is terminated or revoked.     Resp Syncytial Virus by PCR 09/30/2022 NEGATIVE  NEGATIVE Final   Comment: (NOTE) Fact Sheet for Patients: BloggerCourse.com  Fact Sheet  for Healthcare Providers: SeriousBroker.it  This test is not yet approved or cleared by the [place] and has been authorized for detection and/or  diagnosis of SARS-CoV-2 by FDA under an Emergency Use Authorization (EUA). This EUA will remain in effect (meaning this test can be used) for the duration of the COVID-19 declaration under Section 564(b)(1) of the Act, 21 U.S.C. section 360bbb-3(b)(1), unless the authorization is terminated or revoked.  Performed at [facility], [address] [place], [place] [address]    Sodium 10/01/2022 136  135 - 145 mmol/L Final   Potassium 10/01/2022 3.8  3.5 - 5.1 mmol/L Final   Chloride 10/01/2022 104  98 - 111 mmol/L Final   CO2 10/01/2022 27  22 - 32 mmol/L Final   Glucose, Bld 10/01/2022 98  70 - 99 mg/dL Final   Glucose reference range applies only to samples taken after fasting for at least 8 hours.   BUN 10/01/2022 12  6 - 20 mg/dL Final   Creatinine, Ser 10/01/2022 0.98  0.44 - 1.00 mg/dL Final   Calcium 58/59/2924 8.9  8.9 - 10.3 mg/dL Final   GFR, Estimated 10/01/2022 >60  >60 mL/min Final   Comment: (NOTE) Calculated using the CKD-EPI Creatinine Equation (2021)    Anion gap 10/01/2022 5  5 - 15 Final   Performed at [facility], [address], [place], [place] [address]   WBC 10/01/2022 5.1  4.0 - 10.5 K/uL Final   RBC 10/01/2022 4.31  3.87 - 5.11 MIL/uL Final   Hemoglobin 10/01/2022 12.7  12.0 - 15.0 g/dL Final   HCT 38/17/7116 38.8  36.0 - 46.0 % Final   MCV 10/01/2022 90.0  80.0 - 100.0 fL Final   MCH 10/01/2022 29.5  26.0 - 34.0 pg Final   MCHC 10/01/2022 32.7  30.0 - 36.0 g/dL Final   RDW 57/90/3833 12.4  11.5 - 15.5 % Final   Platelets 10/01/2022 300  150 - 400 K/uL Final   nRBC 10/01/2022 0.0  0.0 - 0.2 % Final   Performed at [facility], [address] [place], [place] [address]   Lipase 10/01/2022 29  11 - 51 U/L Final   Performed at [facility], [address], [place], [place] [address]   Color, Urine 10/05/2022 YELLOW  YELLOW Final   APPearance 10/05/2022 HAZY (A)  CLEAR Final   Specific Gravity, Urine 10/05/2022 1.018  1.005 - 1.030  Final   pH 10/05/2022 5.0  5.0 - 8.0 Final   Glucose, UA 10/05/2022 NEGATIVE  NEGATIVE mg/dL Final   Hgb urine dipstick 10/05/2022 NEGATIVE  NEGATIVE Final   Bilirubin Urine 10/05/2022 NEGATIVE  NEGATIVE Final   Ketones, ur 10/05/2022 NEGATIVE  NEGATIVE mg/dL Final   Protein, ur 06/00/4599 NEGATIVE  NEGATIVE mg/dL Final   Nitrite 77/41/4239 NEGATIVE  NEGATIVE Final   Leukocytes,Ua 10/05/2022 TRACE (A)  NEGATIVE Final   RBC / HPF 10/05/2022 0-5  0 - 5 RBC/hpf Final   WBC, UA 10/05/2022 0-5  0 - 5 WBC/hpf Final   Bacteria, UA 10/05/2022 RARE (A)  NONE SEEN Final   Squamous Epithelial / HPF 10/05/2022 0-5  0 - 5 Final   Mucus 10/05/2022 PRESENT   Final   Performed at [facility], [address] [place], [place] [address]   Specimen Description 10/05/2022 URINE, CLEAN CATCH   Final   Special Requests 10/05/2022    Final  Value:NONE Performed at [facility], [place] [address] [place], [place] [address]    Culture 10/05/2022 10,000 COLONIES/mL MULTIPLE SPECIES PRESENT, SUGGEST RECOLLECTION (A)   Final   Report Status 10/05/2022 10/07/2022 FINAL   Final  Admission on 07/04/2022, Discharged on 07/05/2022  Component Date Value Ref Range Status   Sodium 07/04/2022 142  135 - 145 mmol/L Final   Potassium 07/04/2022 4.4  3.5 - 5.1 mmol/L Final   Chloride 07/04/2022 109  98 - 111 mmol/L Final   CO2 07/04/2022 26  22 - 32 mmol/L Final   Glucose, Bld 07/04/2022 96  70 - 99 mg/dL Final   Glucose reference range applies only to samples taken after fasting for at least 8 hours.   BUN 07/04/2022 15  6 - 20 mg/dL Final   Creatinine, Ser 07/04/2022 0.83  0.44 - 1.00 mg/dL Final   Calcium 07/04/2022 9.3  8.9 - 10.3 mg/dL Final   Total Protein 07/04/2022 7.2  6.5 - 8.1 g/dL Final   Albumin 07/04/2022 3.9  3.5 - 5.0 g/dL Final   AST 07/04/2022 23  15 - 41 U/L Final   ALT 07/04/2022 28  0 - 44 U/L Final   Alkaline Phosphatase 07/04/2022 77  38 - 126 U/L Final   Total  Bilirubin 07/04/2022 0.4  0.3 - 1.2 mg/dL Final   GFR, Estimated 07/04/2022 >60  >60 mL/min Final   Comment: (NOTE) Calculated using the CKD-EPI Creatinine Equation (2021)    Anion gap 07/04/2022 7  5 - 15 Final   Performed at [facility], [place] [address], [place], [place] [address]   Alcohol, Ethyl (B) 07/04/2022 <10  <10 mg/dL Final   Comment: (NOTE) Lowest detectable limit for serum alcohol is 10 mg/dL.  For medical purposes only. Performed at [facility], [place] [address], [place], [place] [address]    WBC 07/04/2022 7.0  4.0 - 10.5 K/uL Final   RBC 07/04/2022 4.18  3.87 - 5.11 MIL/uL Final   Hemoglobin 07/04/2022 12.8  12.0 - 15.0 g/dL Final   HCT 07/04/2022 39.1  36.0 - 46.0 % Final   MCV 07/04/2022 93.5  80.0 - 100.0 fL Final   MCH 07/04/2022 30.6  26.0 - 34.0 pg Final   MCHC 07/04/2022 32.7  30.0 - 36.0 g/dL Final   RDW 07/04/2022 12.9  11.5 - 15.5 % Final   Platelets 07/04/2022 243  150 - 400 K/uL Final   nRBC 07/04/2022 0.0  0.0 - 0.2 % Final   Neutrophils Relative % 07/04/2022 43  % Final   Neutro Abs 07/04/2022 3.0  1.7 - 7.7 K/uL Final   Lymphocytes Relative 07/04/2022 50  % Final   Lymphs Abs 07/04/2022 3.5  0.7 - 4.0 K/uL Final   Monocytes Relative 07/04/2022 7  % Final   Monocytes Absolute 07/04/2022 0.5  0.1 - 1.0 K/uL Final   Eosinophils Relative 07/04/2022 0  % Final   Eosinophils Absolute 07/04/2022 0.0  0.0 - 0.5 K/uL Final   Basophils Relative 07/04/2022 0  % Final   Basophils Absolute 07/04/2022 0.0  0.0 - 0.1 K/uL Final   Immature Granulocytes 07/04/2022 0  % Final   Abs Immature Granulocytes 07/04/2022 0.01  0.00 - 0.07 K/uL Final   Performed at [facility], [place] [address], [place], [place] [address]   I-stat hCG, quantitative 07/04/2022 <5.0  <5 mIU/mL Final   Comment 3 07/04/2022  Final   Comment:   GEST. AGE      CONC.  (mIU/mL)   <=1 WEEK        5 - 50     2 WEEKS       50 - 500      3 WEEKS       100 - 10,000     4 WEEKS     1,000 - 30,000        FEMALE AND NON-PREGNANT FEMALE:     LESS THAN 5 mIU/mL   Admission on 06/14/2022, Discharged on 06/14/2022  Component Date Value Ref Range Status   Color, UA 06/14/2022 yellow  yellow Final   Clarity, UA 06/14/2022 cloudy (A)  clear Final   Glucose, UA 06/14/2022 negative  negative mg/dL Final   Bilirubin, UA 16/10/960409/10/2021 negative  negative Final   Ketones, POC UA 06/14/2022 negative  negative mg/dL Final   Spec Grav, UA 54/09/811909/10/2021 1.025  1.010 - 1.025 Final   Blood, UA 06/14/2022 negative  negative Final   pH, UA 06/14/2022 7.5  5.0 - 8.0 Final   Protein Ur, POC 06/14/2022 =30 (A)  negative mg/dL Final   Urobilinogen, UA 06/14/2022 0.2  0.2 or 1.0 E.U./dL Final   Nitrite, UA 14/78/295609/10/2021 Negative  Negative Final   Leukocytes, UA 06/14/2022 Negative  Negative Final   Preg Test, Ur 06/14/2022 Negative  Negative Final   Specimen Description 06/14/2022 URINE, CLEAN CATCH   Final   Special Requests 06/14/2022    Final                   Value:NONE Performed at [facility], [address] [place], [place] [address]    Culture 06/14/2022 MULTIPLE SPECIES PRESENT, SUGGEST RECOLLECTION (A)   Final   Report Status 06/14/2022 06/16/2022 FINAL   Final  Admission on 06/07/2022, Discharged on 06/10/2022  Component Date Value Ref Range Status   SARS Coronavirus 2 by RT PCR 06/07/2022 NEGATIVE  NEGATIVE Final   Comment: (NOTE) SARS-CoV-2 target nucleic acids are NOT DETECTED.  The SARS-CoV-2 RNA is generally detectable in upper respiratory specimens during the acute phase of infection. The lowest concentration of SARS-CoV-2 viral copies this assay can detect is 138 copies/mL. A negative result does not preclude SARS-Cov-2 infection and should not be used as the sole basis for treatment or other patient management decisions. A negative result may occur with  improper specimen collection/handling, submission of specimen  other than nasopharyngeal swab, presence of viral mutation(s) within the areas targeted by this assay, and inadequate number of viral copies(<138 copies/mL). A negative result must be combined with clinical observations, patient history, and epidemiological information. The expected result is Negative.  Fact Sheet for Patients:  BloggerCourse.comhttps://www.fda.gov/media/152166/download  Fact Sheet for Healthcare Providers:  SeriousBroker.ithttps://www.fda.gov/media/152162/download  This test is no                          t yet approved or cleared by the [place] FDA and  has been authorized for detection and/or diagnosis of SARS-CoV-2 by FDA under an Emergency Use Authorization (EUA). This EUA will remain  in effect (meaning this test can be used) for the duration of the COVID-19 declaration under Section 564(b)(1) of the Act, 21 U.S.C.section 360bbb-3(b)(1), unless the authorization is terminated  or revoked sooner.       Influenza A by PCR 06/07/2022 NEGATIVE  NEGATIVE Final   Influenza B by PCR 06/07/2022 NEGATIVE  NEGATIVE Final   Comment: (NOTE) The Xpert Xpress SARS-CoV-2/FLU/RSV plus assay is intended as an aid in the diagnosis of influenza from Nasopharyngeal swab specimens and should not be used as a sole basis for treatment. Nasal washings and aspirates are unacceptable for Xpert Xpress SARS-CoV-2/FLU/RSV testing.  Fact Sheet for Patients: EntrepreneurPulse.com.au  Fact Sheet for Healthcare Providers: IncredibleEmployment.be  This test is not yet approved or cleared by the [place] FDA and has been authorized for detection and/or diagnosis of SARS-CoV-2 by FDA under an Emergency Use Authorization (EUA). This EUA will remain in effect (meaning this test can be used) for the duration of the COVID-19 declaration under Section 564(b)(1) of the Act, 21 U.S.C. section 360bbb-3(b)(1), unless the authorization is terminated or revoked.  Performed at  [facility], [place] [address] [place], [place] [address]    WBC 06/07/2022 5.7  4.0 - 10.5 K/uL Final   RBC 06/07/2022 4.39  3.87 - 5.11 MIL/uL Final   Hemoglobin 06/07/2022 13.2  12.0 - 15.0 g/dL Final   HCT 06/07/2022 40.4  36.0 - 46.0 % Final   MCV 06/07/2022 92.0  80.0 - 100.0 fL Final   MCH 06/07/2022 30.1  26.0 - 34.0 pg Final   MCHC 06/07/2022 32.7  30.0 - 36.0 g/dL Final   RDW 06/07/2022 12.4  11.5 - 15.5 % Final   Platelets 06/07/2022 308  150 - 400 K/uL Final   nRBC 06/07/2022 0.0  0.0 - 0.2 % Final   Neutrophils Relative % 06/07/2022 42  % Final   Neutro Abs 06/07/2022 2.4  1.7 - 7.7 K/uL Final   Lymphocytes Relative 06/07/2022 54  % Final   Lymphs Abs 06/07/2022 3.1  0.7 - 4.0 K/uL Final   Monocytes Relative 06/07/2022 4  % Final   Monocytes Absolute 06/07/2022 0.2  0.1 - 1.0 K/uL Final   Eosinophils Relative 06/07/2022 0  % Final   Eosinophils Absolute 06/07/2022 0.0  0.0 - 0.5 K/uL Final   Basophils Relative 06/07/2022 0  % Final   Basophils Absolute 06/07/2022 0.0  0.0 - 0.1 K/uL Final   Immature Granulocytes 06/07/2022 0  % Final   Abs Immature Granulocytes 06/07/2022 0.01  0.00 - 0.07 K/uL Final   Performed at [facility], [place] [address], [place], [place] [address]   Sodium 06/07/2022 139  135 - 145 mmol/L Final   Potassium 06/07/2022 4.0  3.5 - 5.1 mmol/L Final   Chloride 06/07/2022 104  98 - 111 mmol/L Final   CO2 06/07/2022 28  22 - 32 mmol/L Final   Glucose, Bld 06/07/2022 104 (H)  70 - 99 mg/dL Final   Glucose reference range applies only to samples taken after fasting for at least 8 hours.   BUN 06/07/2022 8  6 - 20 mg/dL Final   Creatinine, Ser 06/07/2022 0.84  0.44 - 1.00 mg/dL Final   Calcium 06/07/2022 9.1  8.9 - 10.3 mg/dL Final   Total Protein 06/07/2022 6.9  6.5 - 8.1 g/dL Final   Albumin 06/07/2022 3.7  3.5 - 5.0 g/dL Final   AST 06/07/2022 19  15 - 41 U/L Final   ALT 06/07/2022 22  0 - 44 U/L Final   Alkaline Phosphatase 06/07/2022  54  38 - 126 U/L Final   Total Bilirubin 06/07/2022 0.4  0.3 - 1.2 mg/dL Final   GFR, Estimated 06/07/2022 >60  >60 mL/min Final   Comment: (NOTE) Calculated using the CKD-EPI Creatinine Equation (2021)  Anion gap 06/07/2022 7  5 - 15 Final   Performed at [facility], [address], [place], [place] [address]   Hgb A1c MFr Bld 06/07/2022 5.0  4.8 - 5.6 % Final   Comment: (NOTE) Pre diabetes:          5.7%-6.4%  Diabetes:              >6.4%  Glycemic control for   <7.0% adults with diabetes    Mean Plasma Glucose 06/07/2022 96.8  mg/dL Final   Performed at [facility], [address] [place], [place] [address]   TSH 06/07/2022 1.620  0.350 - 4.500 uIU/mL Final   Comment: Performed by a 3rd Generation assay with a functional sensitivity of <=0.01 uIU/mL. Performed at [facility], [address] [place], [place] [address]    RPR Ser Ql 06/07/2022 NON REACTIVE  NON REACTIVE Final   Performed at [facility], [address], [place], [place] [address]   Color, Urine 06/07/2022 YELLOW  YELLOW Final   APPearance 06/07/2022 HAZY (A)  CLEAR Final   Specific Gravity, Urine 06/07/2022 1.018  1.005 - 1.030 Final   pH 06/07/2022 7.0  5.0 - 8.0 Final   Glucose, UA 06/07/2022 NEGATIVE  NEGATIVE mg/dL Final   Hgb urine dipstick 06/07/2022 NEGATIVE  NEGATIVE Final   Bilirubin Urine 06/07/2022 NEGATIVE  NEGATIVE Final   Ketones, ur 06/07/2022 NEGATIVE  NEGATIVE mg/dL Final   Protein, ur 25/42/7062 NEGATIVE  NEGATIVE mg/dL Final   Nitrite 37/62/8315 NEGATIVE  NEGATIVE Final   Leukocytes,Ua 06/07/2022 NEGATIVE  NEGATIVE Final   Performed at [facility], [address], [place], [place] [address]   Cholesterol 06/07/2022 164  0 - 200 mg/dL Final   Triglycerides 07/37/1062 158 (H)  <150 mg/dL Final   HDL 69/48/5462 44  >40 mg/dL Final   Total CHOL/HDL Ratio 06/07/2022 3.7  RATIO Final   VLDL 06/07/2022 32  0 - 40 mg/dL Final   LDL Cholesterol 06/07/2022 88   0 - 99 mg/dL Final   Comment:        Total Cholesterol/HDL:CHD Risk Coronary Heart Disease Risk Table                     Men   Women  1/2 Average Risk   3.4   3.3  Average Risk       5.0   4.4  2 X Average Risk   9.6   7.1  3 X Average Risk  23.4   11.0        Use the calculated Patient Ratio above and the CHD Risk Table to determine the patient's CHD Risk.        ATP III CLASSIFICATION (LDL):  <100     mg/dL   Optimal  703-500  mg/dL   Near or Above                    Optimal  130-159  mg/dL   Borderline  938-182  mg/dL   High  >993     mg/dL   Very High Performed at [facility], [address] [place], [place] [address]    HIV Screen 4th Generation wRfx 06/07/2022 Non Reactive  Non Reactive Final   Performed at [facility], [address] [place], [place] [address]   SARSCOV2ONAVIRUS 2 AG 06/07/2022 NEGATIVE  NEGATIVE Final   Comment: (NOTE)  SARS-CoV-2 antigen NOT DETECTED.   Negative results are presumptive.  Negative results do not preclude SARS-CoV-2 infection and should not be used as the sole basis for treatment or other patient management decisions, including infection  control decisions, particularly in the presence of clinical signs and  symptoms consistent with COVID-19, or in those who have been in contact with the virus.  Negative results must be combined with clinical observations, patient history, and epidemiological information. The expected result is Negative.  Fact Sheet for Patients: HandmadeRecipes.com.cy  Fact Sheet for Healthcare Providers: FuneralLife.at  This test is not yet approved or cleared by the [place] FDA and  has been authorized for detection and/or diagnosis of SARS-CoV-2 by FDA under an Emergency Use Authorization (EUA).  This EUA will remain in effect (meaning this test can be used) for the duration of  the COV                          ID-19 declaration under  Section 564(b)(1) of the Act, 21 U.S.C. section 360bbb-3(b)(1), unless the authorization is terminated or revoked sooner.     POC Amphetamine UR 06/07/2022 None Detected  NONE DETECTED (Cut Off Level 1000 ng/mL) Final   POC Secobarbital (BAR) 06/07/2022 None Detected  NONE DETECTED (Cut Off Level 300 ng/mL) Final   POC Buprenorphine (BUP) 06/07/2022 None Detected  NONE DETECTED (Cut Off Level 10 ng/mL) Final   POC Oxazepam (BZO) 06/07/2022 None Detected  NONE DETECTED (Cut Off Level 300 ng/mL) Final   POC Cocaine UR 06/07/2022 None Detected  NONE DETECTED (Cut Off Level 300 ng/mL) Final   POC Methamphetamine UR 06/07/2022 None Detected  NONE DETECTED (Cut Off Level 1000 ng/mL) Final   POC Morphine 06/07/2022 None Detected  NONE DETECTED (Cut Off Level 300 ng/mL) Final   POC Methadone UR 06/07/2022 None Detected  NONE DETECTED (Cut Off Level 300 ng/mL) Final   POC Oxycodone UR 06/07/2022 None Detected  NONE DETECTED (Cut Off Level 100 ng/mL) Final   POC Marijuana UR 06/07/2022 None Detected  NONE DETECTED (Cut Off Level 50 ng/mL) Final   Preg Test, Ur 06/08/2022 NEGATIVE  NEGATIVE Final   Comment:        THE SENSITIVITY OF THIS METHODOLOGY IS >20 mIU/mL. Performed at [facility], [place] [address], [place], [place] [address]    Preg Test, Ur 06/08/2022 NEGATIVE  NEGATIVE Final   Comment:        THE SENSITIVITY OF THIS METHODOLOGY IS >24 mIU/mL     Blood Alcohol level:  Lab Results  Component Value Date   ETH <10 07/04/2022   ETH <10 76/73/4193    Metabolic Disorder Labs: Lab Results  Component Value Date   HGBA1C 5.7 (H) 10/15/2022   MPG 117 10/15/2022   MPG 96.8 06/07/2022   No results found for: "PROLACTIN" Lab Results  Component Value Date   CHOL 181 07/25/2022   TRIG 118 07/25/2022   HDL 45 07/25/2022   CHOLHDL 4.0 07/25/2022   VLDL 24 07/25/2022   LDLCALC 112 (H) 07/25/2022   LDLCALC 88 06/07/2022    Therapeutic Lab Levels: No results found for:  "LITHIUM" No results found for: "VALPROATE" No results found for: "CBMZ"  Physical Findings   GAD-7    Flowsheet Row Office Visit from 12/17/2021 in [facility] [place] [facility] from  10/07/2022 in [facility] from 06/07/2022 in [facility] Office Visit from 12/17/2021 in [facility] Total Score 0 1 2  PHQ-9 Total Score -- 2 8      Flowsheet Row ED from 10/07/2022 in [facility] Most recent reading at 11/05/2022 10:11 AM ED from 10/07/2022 in [facility] at [facility] Most recent reading at 10/07/2022 12:18 PM ED from 07/25/2022 in [facility] Most recent reading at 10/06/2022  9:38 AM  C-SSRS RISK CATEGORY No Risk No Risk No Risk        Musculoskeletal  Strength & Muscle Tone: within normal limits Gait & Station: normal Patient leans: N/A  Psychiatric Specialty Exam  Presentation  General Appearance:  Appropriate for Environment; Casual; Fairly Groomed  Eye Contact: Good  Speech: Clear and Coherent; Normal Rate  Speech Volume: Normal  Handedness: Right   Mood and Affect  Mood: -- ("tired")  Affect: Blunt   Thought Process  Thought Processes: Coherent; Goal Directed; Linear  Descriptions of Associations:Intact  Orientation:Full (Time, Place and Person)  Thought Content:Logical  Diagnosis of Schizophrenia or Schizoaffective disorder in past: Yes  Duration of Psychotic Symptoms: Greater than six months   Hallucinations:Hallucinations: None  Ideas of Reference:None  Suicidal Thoughts:Suicidal Thoughts: No  Homicidal Thoughts:Homicidal Thoughts: No   Sensorium  Memory: Immediate Good  Judgment: Fair  Insight: Fair   Art therapist   Concentration: Good  Attention Span: Good  Recall: Good  Fund of Knowledge: Good  Language: Good   Psychomotor Activity  Psychomotor Activity: Psychomotor Activity: Normal   Assets  Assets: Communication Skills; Desire for Improvement; Financial Resources/Insurance; Physical Health; Resilience   Sleep  Sleep: Sleep: Good   No data recorded  Physical Exam  Physical Exam Constitutional:      General: She is not in acute distress.    Appearance: She is not ill-appearing, toxic-appearing or diaphoretic.  Eyes:     General: No scleral icterus. Cardiovascular:     Rate and Rhythm: Normal rate.  Pulmonary:     Effort: Pulmonary effort is normal. No respiratory distress.  Neurological:     Mental Status: She is alert and oriented to person, place, and time.  Psychiatric:        Attention and Perception: Attention and perception normal.        Speech: Speech normal.        Behavior: Behavior normal. Behavior is cooperative.        Thought Content: Thought content normal.        Cognition and Memory: Cognition and memory normal.    Review of Systems  Constitutional:  Negative for chills and fever.  Respiratory:  Negative for shortness of breath.   Cardiovascular:  Negative for chest pain and palpitations.  Gastrointestinal:  Negative for abdominal pain.  Neurological:  Negative for headaches.   Blood pressure 98/65, pulse 96, temperature 97.8 F (36.6 C), temperature source Oral, resp. rate 15, height 5\' 1"  (1.549 m), weight 198 lb (89.8 kg), SpO2 96 %. Body mass index is 37.41 kg/m.  Treatment Plan Summary: Plan Treatment team meeting on 11/07/22. TOC/DSS is seeking respite ALF. There is a longer -term ALF that is willing to accept [facility] but that ALF is not registered in [place] [facility] and this process will take 4 to 6 weeks.  Lauree Chandler, NP 11/06/2022 3:03 PM

## 2022-11-06 NOTE — ED Notes (Signed)
Pt is in the bed sleeping. Respirations are even and unlabored. No acute distress noted. Will continue to monitor for safety. 

## 2022-11-06 NOTE — ED Notes (Signed)
[name]&Ox4. Patient denies SI/Hi and AVH. Denies any physical complaints when asked. No acute distress noted. Support and encouragement provided. Routine safety checks conducted according to facility protocol. Encouraged patient to notify staff if thoughts of harm toward self or others arise. Patient verbalize understanding and agreement. Will continue to monitor for safety 

## 2022-11-06 NOTE — ED Notes (Signed)
Pt asleep at this hour. No apparent distress. RR even and unlabored. Monitored for safety.

## 2022-11-06 NOTE — ED Notes (Signed)
Pt sleeping soundly. Breathing even and unlabored.  In view of nursing station. Staff will continue to monitor for safety.  

## 2022-11-06 NOTE — ED Notes (Signed)
Pt laying quietly in bed watching TV.  No distress noted. Staff will continue to monitor for safety.  

## 2022-11-06 NOTE — ED Notes (Signed)
[name]&Ox4. Patient is observed drawing and coloring pictures. Patient reports bowel movement x 3 today. Patient denies SI/Hi and AVH. Denies any physical complaints when asked. No acute distress noted. Support and encouragement provided. Routine safety checks conducted according to facility protocol. Encouraged patient to notify staff if thoughts of harm toward self or others arise. Patient verbalize understanding and agreement. Will continue to monitor for safety.

## 2022-11-07 DIAGNOSIS — F17200 Nicotine dependence, unspecified, uncomplicated: Secondary | ICD-10-CM | POA: Diagnosis not present

## 2022-11-07 DIAGNOSIS — F331 Major depressive disorder, recurrent, moderate: Secondary | ICD-10-CM | POA: Diagnosis not present

## 2022-11-07 DIAGNOSIS — F333 Major depressive disorder, recurrent, severe with psychotic symptoms: Secondary | ICD-10-CM | POA: Diagnosis not present

## 2022-11-07 DIAGNOSIS — F419 Anxiety disorder, unspecified: Secondary | ICD-10-CM | POA: Diagnosis not present

## 2022-11-07 DIAGNOSIS — E118 Type 2 diabetes mellitus with unspecified complications: Secondary | ICD-10-CM | POA: Diagnosis not present

## 2022-11-07 DIAGNOSIS — N390 Urinary tract infection, site not specified: Secondary | ICD-10-CM | POA: Diagnosis not present

## 2022-11-07 DIAGNOSIS — F209 Schizophrenia, unspecified: Secondary | ICD-10-CM | POA: Diagnosis not present

## 2022-11-07 DIAGNOSIS — N9489 Other specified conditions associated with female genital organs and menstrual cycle: Secondary | ICD-10-CM | POA: Diagnosis not present

## 2022-11-07 DIAGNOSIS — E039 Hypothyroidism, unspecified: Secondary | ICD-10-CM | POA: Diagnosis not present

## 2022-11-07 DIAGNOSIS — Z1152 Encounter for screening for COVID-19: Secondary | ICD-10-CM | POA: Diagnosis not present

## 2022-11-07 NOTE — Plan of Care (Addendum)
Informed by RN that patient declined scheduled metformin because patient's dad told her to.   Patient reported that last night or the night prior, patient was talking to her dad on the phone. During the conversation, dad mentioned kidney concerns about metformin and told patient not to take it.   Discussed with patient that her renal function is wnl per labs on 10/07/2022.  Patient reported that she understood and was amenable to continuing scheduled metformin. She denied side effects to the metformin.  Merrily Brittle, DO

## 2022-11-07 NOTE — ED Notes (Signed)
Patient alert and oriented x 3. Denies SI/HI/AVH. Denies intent or plan to harm self or others. Routine conducted according to faculty protocol. Encourage patient to notify staff with any needs or concerns. Patient verbalized agreement and understanding. Will continue to monitor for safety. 

## 2022-11-07 NOTE — ED Notes (Signed)
Pt sleeping at present, no distress noted.  Monitoring for safety. 

## 2022-11-07 NOTE — ED Notes (Signed)
Patient refused the metformin  will notify the provider. Patient states that her father told her not to take metformin.

## 2022-11-07 NOTE — ED Notes (Signed)
Provider states that she talk to [facility] about the metformin and that she agreed to take the medication starting in the am.

## 2022-11-07 NOTE — ED Notes (Signed)
Pt is in the bed sleeping. Respirations are even and unlabored. No acute distress noted. Will continue to monitor for safety. 

## 2022-11-07 NOTE — ED Notes (Signed)
Patient  sleeping in no acute stress. RR even and unlabored .Environment secured .Will continue to monitor for safely. 

## 2022-11-07 NOTE — Care Management (Signed)
OBS Care Management   Jahmia has a follow up Treatment Team Meeting today to determine if a respite provider has accepted [facility] so that she can safety discharge to their facility today at 1:30pm   Writer will notify the NP and the RN working with the patient.

## 2022-11-07 NOTE — ED Provider Notes (Signed)
Behavioral Health Progress Note  Date and Time: 11/07/2022 9:03 AM Name: Nicole [name] MRN:  [id number]  Nicole [name] is a 32 y/o female with PMH of SCZ unspecified, MDD with psychosis, who presented voluntarily to [facility] (07/23/2022) via [facility] after a verbal altercation with [name] (not patient's legal guardian) for transient SI. This is patient's fourth visit to [facility] for similar concerns this year. Patient has been dismissed from previous group home due to threatening SI and HI, then leaving the group home. 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  Subjective:    Pt seen on unit, resting comfortably on reclining chair bed. Patient reported feeling "good". Reported there are no new changes or concerns. Sleep and appetite are stable and appropriate.  Denied medication side effects and tolerating them well.  No other questions or concerns,  Denied active and passive SI. Denied HI/AVH. Contracted to safety.  Sleep: Good  Appetite:  Good  Review of Systems  Respiratory:  Negative for shortness of breath.   Cardiovascular:  Negative for chest pain.  Gastrointestinal:  Negative for nausea and vomiting.  Neurological:  Negative for dizziness and headaches.     Diagnosis:  Final diagnoses:  Tobacco use disorder  Schizophrenia, unspecified type (HCC)  MDD (major depressive disorder), recurrent episode, moderate (HCC)  Hypothyroidism, unspecified type    Total Time spent with patient: 15 minutes  Past Psychiatric History: SCZ unspecified, MDD with psychosis, anxiety, depression, tobacco use Past Medical History: Hypothroidism Family History: Hypertension - father diabetes  Family Psychiatric  History: See H&P Social History: See H&P   Additional Social History:     Current Medications:  Current Facility-Administered Medications  Medication Dose Route Frequency Provider Last Rate Last Admin   acetaminophen (TYLENOL) tablet 650 mg  650 mg Oral Q6H PRN  Marlou Sa, NP   650 mg at 11/04/22 2154   alum & mag hydroxide-simeth (MAALOX/MYLANTA) 200-200-20 MG/5ML suspension 30 mL  30 mL Oral Q4H PRN Marlou Sa, NP   30 mL at 10/26/22 1755   ARIPiprazole ER (ABILIFY MAINTENA) 400 MG prefilled syringe 400 mg  400 mg Intramuscular Q28 days Princess Bruins, DO   400 mg at 10/25/22 0920   cetaphil lotion   Topical PRN Princess Bruins, DO       fluticasone [facility]) 50 MCG/ACT nasal spray 1 spray  1 spray Each Nare QHS Princess Bruins, DO   1 spray at 11/06/22 2114   hydrOXYzine (ATARAX) tablet 25 mg  25 mg Oral TID PRN Lorri Frederick, MD   25 mg at 11/06/22 2114   ketoconazole (NIZORAL) 2 % cream   Topical BID Princess Bruins, DO   1 Application at 11/06/22 7829   levothyroxine (SYNTHROID) tablet 100 mcg  100 mcg Oral Once Byungura, Veronique M, NP       levothyroxine (SYNTHROID) tablet 100 mcg  100 mcg Oral Q0600 Marlou Sa, NP   100 mcg at 11/07/22 0641   lidocaine (LIDODERM) 5 % 1 patch  1 patch Transdermal Q24H Princess Bruins, DO   1 patch at 11/06/22 0917   loratadine (CLARITIN) tablet 10 mg  10 mg Oral Daily Carrion-Carrero, Karle Starch, MD   10 mg at 11/06/22 0914   magnesium hydroxide (MILK OF MAGNESIA) suspension 30 mL  30 mL Oral Daily PRN Marlou Sa, NP   30 mL at 10/12/22 2145   metFORMIN (GLUCOPHAGE) tablet 500 mg  500 mg Oral Q breakfast Carrion-Carrero, Karle Starch, MD   500 mg  at 11/06/22 0808   nicotine (NICODERM CQ - dosed in mg/24 hours) patch 14 mg  14 mg Transdermal Daily PRN Carrion-Carrero, Karle StarchMargely, MD       ondansetron (ZOFRAN-ODT) disintegrating tablet 4 mg  4 mg Oral Q8H PRN Carrion-Carrero, Margely, MD   4 mg at 11/01/22 2246   Oxcarbazepine (TRILEPTAL) tablet 300 mg  300 mg Oral BID Byungura, Veronique M, NP   300 mg at 11/06/22 2113   pantoprazole (PROTONIX) EC tablet 40 mg  40 mg Oral Daily Carrion-Carrero, Margely, MD   40 mg at 11/06/22 0915   polyethylene glycol (MIRALAX / GLYCOLAX) packet  17 g  17 g Oral Daily Princess Bruinsguyen, Ameer Sanden, DO   17 g at 11/06/22 0912   QUEtiapine (SEROQUEL) tablet 400 mg  400 mg Oral BID Rayburn GoByungura, Veronique M, NP   400 mg at 11/06/22 2113   sertraline (ZOLOFT) tablet 150 mg  150 mg Oral Daily Carrion-Carrero, Margely, MD   150 mg at 11/06/22 0915   sodium chloride (OCEAN) 0.65 % nasal spray 1 spray  1 spray Each Nare Daily Princess BruinsNguyen, Tanyon Alipio, DO   1 spray at 11/06/22 0918   traZODone (DESYREL) tablet 100 mg  100 mg Oral QHS Olin PiaByungura, Veronique M, NP   100 mg at 11/06/22 2116   traZODone (DESYREL) tablet 50 mg  50 mg Oral QHS PRN Marlou SaByungura, Veronique M, NP   50 mg at 11/06/22 2113   valACYclovir (VALTREX) tablet 500 mg  500 mg Oral Daily Carrion-Carrero, Karle StarchMargely, MD   500 mg at 11/06/22 0914   zinc oxide (BALMEX) 11.3 % cream   Topical PRN Princess BruinsNguyen, Charman Blasco, DO       Current Outpatient Medications  Medication Sig Dispense Refill   ABILIFY MAINTENA 400 MG PRSY prefilled syringe 400 mg every 28 (twenty-eight) days.     cetirizine (ZYRTEC) 10 MG tablet Take 10 mg by mouth daily.     cyclobenzaprine (FLEXERIL) 10 MG tablet Take 1 tablet (10 mg total) by mouth 2 (two) times daily as needed for muscle spasms. 20 tablet 0   fluticasone (FLONASE) 50 MCG/ACT nasal spray Place 1 spray into both nostrils daily.     meloxicam (MOBIC) 15 MG tablet Take 15 mg by mouth daily.     nitrofurantoin, macrocrystal-monohydrate, (MACROBID) 100 MG capsule Take 1 capsule (100 mg total) by mouth 2 (two) times daily. 10 capsule 0   ondansetron (ZOFRAN-ODT) 4 MG disintegrating tablet Take 1 tablet (4 mg total) by mouth every 8 (eight) hours as needed for nausea or vomiting. 20 tablet 0   Oxcarbazepine (TRILEPTAL) 300 MG tablet Take 1 tablet (300 mg total) by mouth 2 (two) times daily. 60 tablet 0   QUEtiapine (SEROQUEL) 400 MG tablet Take 1 tablet (400 mg total) by mouth 2 (two) times daily. 60 tablet 0   sertraline (ZOLOFT) 50 MG tablet Take 3 tablets (150 mg total) by mouth in the morning. 90 tablet  0   traZODone (DESYREL) 100 MG tablet Take 1 tablet (100 mg total) by mouth at bedtime. 30 tablet 0   valACYclovir (VALTREX) 500 MG tablet Take 500 mg by mouth daily.      Labs  Lab Results:  Admission on 10/07/2022  Component Date Value Ref Range Status   Hgb A1c MFr Bld 10/15/2022 5.7 (H)  4.8 - 5.6 % Final   Comment: (NOTE)         Prediabetes: 5.7 - 6.4         Diabetes: >6.4  Glycemic control for adults with diabetes: <7.0    Mean Plasma Glucose 10/15/2022 117  mg/dL Final   Comment: (NOTE) Performed At: [facility] [place], [place] [id number] Rush Farmer MD RJ:[id number]   Office Visit on 10/21/2022  Component Date Value Ref Range Status   SARSCOV2ONAVIRUS 2 AG 11/01/2022 NEGATIVE  NEGATIVE Final   Comment: (NOTE) SARS-CoV-2 antigen NOT DETECTED.   Negative results are presumptive.  Negative results do not preclude SARS-CoV-2 infection and should not be used as the sole basis for treatment or other patient management decisions, including infection  control decisions, particularly in the presence of clinical signs and  symptoms consistent with COVID-19, or in those who have been in contact with the virus.  Negative results must be combined with clinical observations, patient history, and epidemiological information. The expected result is Negative.  Fact Sheet for Patients: HandmadeRecipes.com.cy  Fact Sheet for Healthcare Providers: FuneralLife.at  This test is not yet approved or cleared by the [place] FDA and  has been authorized for detection and/or diagnosis of SARS-CoV-2 by FDA under an Emergency Use Authorization (EUA).  This EUA will remain in effect (meaning this test can be used) for the duration of  the COV                          ID-19 declaration under Section 564(b)(1) of the Act, 21 U.S.C. section 360bbb-3(b)(1), unless the authorization is terminated or revoked  sooner.    Admission on 10/07/2022, Discharged on 10/07/2022  Component Date Value Ref Range Status   Color, Urine 10/07/2022 YELLOW  YELLOW Final   APPearance 10/07/2022 HAZY (A)  CLEAR Final   Specific Gravity, Urine 10/07/2022 1.011  1.005 - 1.030 Final   pH 10/07/2022 7.0  5.0 - 8.0 Final   Glucose, UA 10/07/2022 NEGATIVE  NEGATIVE mg/dL Final   Hgb urine dipstick 10/07/2022 SMALL (A)  NEGATIVE Final   Bilirubin Urine 10/07/2022 NEGATIVE  NEGATIVE Final   Ketones, ur 10/07/2022 NEGATIVE  NEGATIVE mg/dL Final   Protein, ur 10/07/2022 NEGATIVE  NEGATIVE mg/dL Final   Nitrite 10/07/2022 NEGATIVE  NEGATIVE Final   Leukocytes,Ua 10/07/2022 LARGE (A)  NEGATIVE Final   RBC / HPF 10/07/2022 0-5  0 - 5 RBC/hpf Final   WBC, UA 10/07/2022 0-5  0 - 5 WBC/hpf Final   Bacteria, UA 10/07/2022 FEW (A)  NONE SEEN Final   Squamous Epithelial / HPF 10/07/2022 11-20  0 - 5 Final   Performed at [facility], [place] [address], [place], [place] [address]   I-stat hCG, quantitative 10/07/2022 <5.0  <5 mIU/mL Final   Comment 3 10/07/2022          Final   Comment:   GEST. AGE      CONC.  (mIU/mL)   <=1 WEEK        5 - 50     2 WEEKS       50 - 500     3 WEEKS       100 - 10,000     4 WEEKS     1,000 - 30,000        FEMALE AND NON-PREGNANT FEMALE:     LESS THAN 5 mIU/mL    Sodium 10/07/2022 138  135 - 145 mmol/L Final   Potassium 10/07/2022 4.3  3.5 - 5.1 mmol/L Final   Chloride 10/07/2022 104  98 - 111 mmol/L Final   CO2 10/07/2022  28  22 - 32 mmol/L Final   Glucose, Bld 10/07/2022 90  70 - 99 mg/dL Final   Glucose reference range applies only to samples taken after fasting for at least 8 hours.   BUN 10/07/2022 8  6 - 20 mg/dL Final   Creatinine, Ser 10/07/2022 0.96  0.44 - 1.00 mg/dL Final   Calcium 10/07/2022 9.1  8.9 - 10.3 mg/dL Final   GFR, Estimated 10/07/2022 >60  >60 mL/min Final   Comment: (NOTE) Calculated using the CKD-EPI Creatinine Equation (2021)    Anion gap 10/07/2022 6  5 -  15 Final   Performed at [facility], [place] [address], [place], [place] [address]   WBC 10/07/2022 5.8  4.0 - 10.5 K/uL Final   RBC 10/07/2022 4.36  3.87 - 5.11 MIL/uL Final   Hemoglobin 10/07/2022 12.9  12.0 - 15.0 g/dL Final   HCT 10/07/2022 40.0  36.0 - 46.0 % Final   MCV 10/07/2022 91.7  80.0 - 100.0 fL Final   MCH 10/07/2022 29.6  26.0 - 34.0 pg Final   MCHC 10/07/2022 32.3  30.0 - 36.0 g/dL Final   RDW 10/07/2022 12.4  11.5 - 15.5 % Final   Platelets 10/07/2022 295  150 - 400 K/uL Final   nRBC 10/07/2022 0.0  0.0 - 0.2 % Final   Performed at [place] [address], [place], [place] [address]   SARS Coronavirus 2 by RT PCR 10/07/2022 NEGATIVE  NEGATIVE Final   Comment: (NOTE) SARS-CoV-2 target nucleic acids are NOT DETECTED.  The SARS-CoV-2 RNA is generally detectable in upper respiratory specimens during the acute phase of infection. The lowest concentration of SARS-CoV-2 viral copies this assay can detect is 138 copies/mL. A negative result does not preclude SARS-Cov-2 infection and should not be used as the sole basis for treatment or other patient management decisions. A negative result may occur with  improper specimen collection/handling, submission of specimen other than nasopharyngeal swab, presence of viral mutation(s) within the areas targeted by this assay, and inadequate number of viral copies(<138 copies/mL). A negative result must be combined with clinical observations, patient history, and epidemiological information. The expected result is Negative.  Fact Sheet for Patients:  EntrepreneurPulse.com.au  Fact Sheet for Healthcare Providers:  IncredibleEmployment.be  This test is no                          t yet approved or cleared by the [place] FDA and  has been authorized for detection and/or diagnosis of SARS-CoV-2 by FDA under an Emergency Use Authorization (EUA). This EUA will remain  in effect  (meaning this test can be used) for the duration of the COVID-19 declaration under Section 564(b)(1) of the Act, 21 U.S.C.section 360bbb-3(b)(1), unless the authorization is terminated  or revoked sooner.       Influenza A by PCR 10/07/2022 NEGATIVE  NEGATIVE Final   Influenza B by PCR 10/07/2022 NEGATIVE  NEGATIVE Final   Comment: (NOTE) The Xpert Xpress SARS-CoV-2/FLU/RSV plus assay is intended as an aid in the diagnosis of influenza from Nasopharyngeal swab specimens and should not be used as a sole basis for treatment. Nasal washings and aspirates are unacceptable for Xpert Xpress SARS-CoV-2/FLU/RSV testing.  Fact Sheet for Patients: EntrepreneurPulse.com.au  Fact Sheet for Healthcare Providers: IncredibleEmployment.be  This test is not yet approved or cleared by the [place] FDA and has been authorized for detection and/or diagnosis of SARS-CoV-2 by  FDA under an Emergency Use Authorization (EUA). This EUA will remain in effect (meaning this test can be used) for the duration of the COVID-19 declaration under Section 564(b)(1) of the Act, 21 U.S.C. section 360bbb-3(b)(1), unless the authorization is terminated or revoked.     Resp Syncytial Virus by PCR 10/07/2022 NEGATIVE  NEGATIVE Final   Comment: (NOTE) Fact Sheet for Patients: EntrepreneurPulse.com.au  Fact Sheet for Healthcare Providers: IncredibleEmployment.be  This test is not yet approved or cleared by the [place] FDA and has been authorized for detection and/or diagnosis of SARS-CoV-2 by FDA under an Emergency Use Authorization (EUA). This EUA will remain in effect (meaning this test can be used) for the duration of the COVID-19 declaration under Section 564(b)(1) of the Act, 21 U.S.C. section 360bbb-3(b)(1), unless the authorization is terminated or revoked.  Performed at [facility], [place] [address] [place],  [place] [address]    Specimen Description 10/07/2022 URINE, CLEAN CATCH   Final   Special Requests 10/07/2022    Final                   Value:NONE Performed at [facility], [place] [address], [place], [place] [address]    Culture 10/07/2022 MULTIPLE SPECIES PRESENT, SUGGEST RECOLLECTION (A)   Final   Report Status 10/07/2022 10/08/2022 FINAL   Final  Admission on 07/25/2022, Discharged on 10/07/2022  Component Date Value Ref Range Status   SARS Coronavirus 2 by RT PCR 07/25/2022 NEGATIVE  NEGATIVE Final   Comment: (NOTE) SARS-CoV-2 target nucleic acids are NOT DETECTED.  The SARS-CoV-2 RNA is generally detectable in upper respiratory specimens during the acute phase of infection. The lowest concentration of SARS-CoV-2 viral copies this assay can detect is 138 copies/mL. A negative result does not preclude SARS-Cov-2 infection and should not be used as the sole basis for treatment or other patient management decisions. A negative result may occur with  improper specimen collection/handling, submission of specimen other than nasopharyngeal swab, presence of viral mutation(s) within the areas targeted by this assay, and inadequate number of viral copies(<138 copies/mL). A negative result must be combined with clinical observations, patient history, and epidemiological information. The expected result is Negative.  Fact Sheet for Patients:  EntrepreneurPulse.com.au  Fact Sheet for Healthcare Providers:  IncredibleEmployment.be  This test is no                          t yet approved or cleared by the [place] FDA and  has been authorized for detection and/or diagnosis of SARS-CoV-2 by FDA under an Emergency Use Authorization (EUA). This EUA will remain  in effect (meaning this test can be used) for the duration of the COVID-19 declaration under Section 564(b)(1) of the Act, 21 U.S.C.section 360bbb-3(b)(1), unless the authorization is terminated   or revoked sooner.       Influenza A by PCR 07/25/2022 NEGATIVE  NEGATIVE Final   Influenza B by PCR 07/25/2022 NEGATIVE  NEGATIVE Final   Comment: (NOTE) The Xpert Xpress SARS-CoV-2/FLU/RSV plus assay is intended as an aid in the diagnosis of influenza from Nasopharyngeal swab specimens and should not be used as a sole basis for treatment. Nasal washings and aspirates are unacceptable for Xpert Xpress SARS-CoV-2/FLU/RSV testing.  Fact Sheet for Patients: EntrepreneurPulse.com.au  Fact Sheet for Healthcare Providers: IncredibleEmployment.be  This test is not yet approved or cleared by the [place] FDA and has been authorized for detection and/or diagnosis  of SARS-CoV-2 by FDA under an Emergency Use Authorization (EUA). This EUA will remain in effect (meaning this test can be used) for the duration of the COVID-19 declaration under Section 564(b)(1) of the Act, 21 U.S.C. section 360bbb-3(b)(1), unless the authorization is terminated or revoked.  Performed at [facility], [place] [address] [place], [place] [address]    WBC 07/25/2022 8.3  4.0 - 10.5 K/uL Final   RBC 07/25/2022 4.44  3.87 - 5.11 MIL/uL Final   Hemoglobin 07/25/2022 13.7  12.0 - 15.0 g/dL Final   HCT 07/25/2022 40.2  36.0 - 46.0 % Final   MCV 07/25/2022 90.5  80.0 - 100.0 fL Final   MCH 07/25/2022 30.9  26.0 - 34.0 pg Final   MCHC 07/25/2022 34.1  30.0 - 36.0 g/dL Final   RDW 07/25/2022 12.2  11.5 - 15.5 % Final   Platelets 07/25/2022 248  150 - 400 K/uL Final   nRBC 07/25/2022 0.0  0.0 - 0.2 % Final   Neutrophils Relative % 07/25/2022 43  % Final   Neutro Abs 07/25/2022 3.6  1.7 - 7.7 K/uL Final   Lymphocytes Relative 07/25/2022 52  % Final   Lymphs Abs 07/25/2022 4.2 (H)  0.7 - 4.0 K/uL Final   Monocytes Relative 07/25/2022 5  % Final   Monocytes Absolute 07/25/2022 0.4  0.1 - 1.0 K/uL Final   Eosinophils Relative 07/25/2022 0  % Final   Eosinophils Absolute  07/25/2022 0.0  0.0 - 0.5 K/uL Final   Basophils Relative 07/25/2022 0  % Final   Basophils Absolute 07/25/2022 0.0  0.0 - 0.1 K/uL Final   Immature Granulocytes 07/25/2022 0  % Final   Abs Immature Granulocytes 07/25/2022 0.02  0.00 - 0.07 K/uL Final   Performed at [facility], [place] [address] [place], [place] [address]   Sodium 07/25/2022 138  135 - 145 mmol/L Final   Potassium 07/25/2022 4.0  3.5 - 5.1 mmol/L Final   Chloride 07/25/2022 104  98 - 111 mmol/L Final   CO2 07/25/2022 29  22 - 32 mmol/L Final   Glucose, Bld 07/25/2022 83  70 - 99 mg/dL Final   Glucose reference range applies only to samples taken after fasting for at least 8 hours.   BUN 07/25/2022 11  6 - 20 mg/dL Final   Creatinine, Ser 07/25/2022 0.97  0.44 - 1.00 mg/dL Final   Calcium 07/25/2022 9.2  8.9 - 10.3 mg/dL Final   Total Protein 07/25/2022 7.0  6.5 - 8.1 g/dL Final   Albumin 07/25/2022 3.8  3.5 - 5.0 g/dL Final   AST 07/25/2022 18  15 - 41 U/L Final   ALT 07/25/2022 22  0 - 44 U/L Final   Alkaline Phosphatase 07/25/2022 64  38 - 126 U/L Final   Total Bilirubin 07/25/2022 0.2 (L)  0.3 - 1.2 mg/dL Final   GFR, Estimated 07/25/2022 >60  >60 mL/min Final   Comment: (NOTE) Calculated using the CKD-EPI Creatinine Equation (2021)    Anion gap 07/25/2022 5  5 - 15 Final   Performed at [place] [address], [place], [place] [address]   POC Amphetamine UR 07/25/2022 None Detected  NONE DETECTED (Cut Off Level 1000 ng/mL) Preliminary   POC Secobarbital (BAR) 07/25/2022 None Detected  NONE DETECTED (Cut Off Level 300 ng/mL) Preliminary   POC Buprenorphine (BUP) 07/25/2022 None Detected  NONE DETECTED (Cut Off Level 10 ng/mL) Preliminary   POC Oxazepam (BZO) 07/25/2022 None Detected  NONE DETECTED (Cut Off Level 300 ng/mL) Preliminary   POC Cocaine UR 07/25/2022 None Detected  NONE DETECTED (Cut Off Level 300 ng/mL) Preliminary   POC Methamphetamine UR 07/25/2022 None Detected  NONE DETECTED (Cut  Off Level 1000 ng/mL) Preliminary   POC Morphine 07/25/2022 None Detected  NONE DETECTED (Cut Off Level 300 ng/mL) Preliminary   POC Methadone UR 07/25/2022 None Detected  NONE DETECTED (Cut Off Level 300 ng/mL) Preliminary   POC Oxycodone UR 07/25/2022 None Detected  NONE DETECTED (Cut Off Level 100 ng/mL) Preliminary   POC Marijuana UR 07/25/2022 None Detected  NONE DETECTED (Cut Off Level 50 ng/mL) Preliminary   SARSCOV2ONAVIRUS 2 AG 07/25/2022 NEGATIVE  NEGATIVE Final   Comment: (NOTE) SARS-CoV-2 antigen NOT DETECTED.   Negative results are presumptive.  Negative results do not preclude SARS-CoV-2 infection and should not be used as the sole basis for treatment or other patient management decisions, including infection  control decisions, particularly in the presence of clinical signs and  symptoms consistent with COVID-19, or in those who have been in contact with the virus.  Negative results must be combined with clinical observations, patient history, and epidemiological information. The expected result is Negative.  Fact Sheet for Patients: HandmadeRecipes.com.cy  Fact Sheet for Healthcare Providers: FuneralLife.at  This test is not yet approved or cleared by the [place] FDA and  has been authorized for detection and/or diagnosis of SARS-CoV-2 by FDA under an Emergency Use Authorization (EUA).  This EUA will remain in effect (meaning this test can be used) for the duration of  the COV                          ID-19 declaration under Section 564(b)(1) of the Act, 21 U.S.C. section 360bbb-3(b)(1), unless the authorization is terminated or revoked sooner.     Cholesterol 07/25/2022 181  0 - 200 mg/dL Final   Triglycerides 07/25/2022 118  <150 mg/dL Final   HDL 07/25/2022 45  >40 mg/dL Final   Total CHOL/HDL Ratio 07/25/2022 4.0  RATIO Final   VLDL 07/25/2022 24  0 - 40 mg/dL Final   LDL Cholesterol 07/25/2022 112 (H)  0 -  99 mg/dL Final   Comment:        Total Cholesterol/HDL:CHD Risk Coronary Heart Disease Risk Table                     Men   Women  1/2 Average Risk   3.4   3.3  Average Risk       5.0   4.4  2 X Average Risk   9.6   7.1  3 X Average Risk  23.4   11.0        Use the calculated Patient Ratio above and the CHD Risk Table to determine the patient's CHD Risk.        ATP III CLASSIFICATION (LDL):  <100     mg/dL   Optimal  100-129  mg/dL   Near or Above                    Optimal  130-159  mg/dL   Borderline  160-189  mg/dL   High  >190     mg/dL   Very High Performed at [place] [address] [place], [place] [address]    TSH 07/25/2022 6.668 (H)  0.350 - 4.500 uIU/mL Final   Comment: Performed by a  3rd Generation assay with a functional sensitivity of <=0.01 uIU/mL. Performed at [facility], [address], [place], [place] [address]    Glucose-Capillary 07/26/2022 104 (H)  70 - 99 mg/dL Final   Glucose reference range applies only to samples taken after fasting for at least 8 hours.   T3, Free 07/31/2022 2.3  2.0 - 4.4 pg/mL Final   Comment: (NOTE) Performed At: [facility] [address] [place], [place] [id number] Jolene Schimke MD GE:[id number]    Free T4 07/31/2022 0.60 (L)  0.61 - 1.12 ng/dL Final   Comment: (NOTE) Biotin ingestion may interfere with free T4 tests. If the results are inconsistent with the TSH level, previous test results, or the clinical presentation, then consider biotin interference. If needed, order repeat testing after stopping biotin. Performed at [facility], [address], [place], [place] [address]    Glucose-Capillary 08/29/2022 100 (H)  70 - 99 mg/dL Final   Glucose reference range applies only to samples taken after fasting for at least 8 hours.   TSH 09/05/2022 0.793  0.350 - 4.500 uIU/mL Final   Comment: Performed by a 3rd Generation assay with a functional sensitivity of <=0.01 uIU/mL. Performed at  [facility], [address], [place], [place] [address]    Free T4 09/05/2022 0.73  0.61 - 1.12 ng/dL Final   Comment: (NOTE) Biotin ingestion may interfere with free T4 tests. If the results are inconsistent with the TSH level, previous test results, or the clinical presentation, then consider biotin interference. If needed, order repeat testing after stopping biotin. Performed at [facility], [address], [place], [place] [address]    Preg Test, Ur 09/06/2022 Negative  Negative Final   Preg Test, Ur 09/06/2022 NEGATIVE  NEGATIVE Final   Comment:        THE SENSITIVITY OF THIS METHODOLOGY IS >24 mIU/mL    Preg Test, Ur 09/05/2022 NEGATIVE  NEGATIVE Final   Comment:        THE SENSITIVITY OF THIS METHODOLOGY IS >24 mIU/mL    Sodium 09/13/2022 136  135 - 145 mmol/L Final   Potassium 09/13/2022 4.5  3.5 - 5.1 mmol/L Final   Chloride 09/13/2022 105  98 - 111 mmol/L Final   CO2 09/13/2022 21 (L)  22 - 32 mmol/L Final   Glucose, Bld 09/13/2022 111 (H)  70 - 99 mg/dL Final   Glucose reference range applies only to samples taken after fasting for at least 8 hours.   BUN 09/13/2022 14  6 - 20 mg/dL Final   Creatinine, Ser 09/13/2022 0.92  0.44 - 1.00 mg/dL Final   Calcium 74/25/9563 9.1  8.9 - 10.3 mg/dL Final   GFR, Estimated 09/13/2022 >60  >60 mL/min Final   Comment: (NOTE) Calculated using the CKD-EPI Creatinine Equation (2021)    Anion gap 09/13/2022 10  5 - 15 Final   Performed at [facility], [address], [place], [place] [address]   Vitamin B-12 09/13/2022 585  180 - 914 pg/mL Final   Comment: (NOTE) This assay is not validated for testing neonatal or myeloproliferative syndrome specimens for Vitamin B12 levels. Performed at [facility], [address] [place], [place] [address]    Color, Urine 09/14/2022 YELLOW  YELLOW Final   APPearance 09/14/2022 HAZY (A)  CLEAR Final   Specific Gravity, Urine 09/14/2022 1.025  1.005 - 1.030 Final    pH 09/14/2022 5.0  5.0 - 8.0  Final   Glucose, UA 09/14/2022 NEGATIVE  NEGATIVE mg/dL Final   Hgb urine dipstick 09/14/2022 NEGATIVE  NEGATIVE Final   Bilirubin Urine 09/14/2022 NEGATIVE  NEGATIVE Final   Ketones, ur 09/14/2022 NEGATIVE  NEGATIVE mg/dL Final   Protein, ur 09/14/2022 NEGATIVE  NEGATIVE mg/dL Final   Nitrite 09/14/2022 NEGATIVE  NEGATIVE Final   Leukocytes,Ua 09/14/2022 TRACE (A)  NEGATIVE Final   RBC / HPF 09/14/2022 0-5  0 - 5 RBC/hpf Final   WBC, UA 09/14/2022 0-5  0 - 5 WBC/hpf Final   Bacteria, UA 09/14/2022 RARE (A)  NONE SEEN Final   Squamous Epithelial / HPF 09/14/2022 0-5  0 - 5 Final   Mucus 09/14/2022 PRESENT   Final   Performed at [facility], [place] [address], [place], [place] [address]   Glucose-Capillary 09/16/2022 105 (H)  70 - 99 mg/dL Final   Glucose reference range applies only to samples taken after fasting for at least 8 hours.   SARS Coronavirus 2 by RT PCR 09/30/2022 NEGATIVE  NEGATIVE Final   Comment: (NOTE) SARS-CoV-2 target nucleic acids are NOT DETECTED.  The SARS-CoV-2 RNA is generally detectable in upper respiratory specimens during the acute phase of infection. The lowest concentration of SARS-CoV-2 viral copies this assay can detect is 138 copies/mL. A negative result does not preclude SARS-Cov-2 infection and should not be used as the sole basis for treatment or other patient management decisions. A negative result may occur with  improper specimen collection/handling, submission of specimen other than nasopharyngeal swab, presence of viral mutation(s) within the areas targeted by this assay, and inadequate number of viral copies(<138 copies/mL). A negative result must be combined with clinical observations, patient history, and epidemiological information. The expected result is Negative.  Fact Sheet for Patients:  EntrepreneurPulse.com.au  Fact Sheet for Healthcare Providers:   IncredibleEmployment.be  This test is no                          t yet approved or cleared by the [place] FDA and  has been authorized for detection and/or diagnosis of SARS-CoV-2 by FDA under an Emergency Use Authorization (EUA). This EUA will remain  in effect (meaning this test can be used) for the duration of the COVID-19 declaration under Section 564(b)(1) of the Act, 21 U.S.C.section 360bbb-3(b)(1), unless the authorization is terminated  or revoked sooner.       Influenza A by PCR 09/30/2022 NEGATIVE  NEGATIVE Final   Influenza B by PCR 09/30/2022 NEGATIVE  NEGATIVE Final   Comment: (NOTE) The Xpert Xpress SARS-CoV-2/FLU/RSV plus assay is intended as an aid in the diagnosis of influenza from Nasopharyngeal swab specimens and should not be used as a sole basis for treatment. Nasal washings and aspirates are unacceptable for Xpert Xpress SARS-CoV-2/FLU/RSV testing.  Fact Sheet for Patients: EntrepreneurPulse.com.au  Fact Sheet for Healthcare Providers: IncredibleEmployment.be  This test is not yet approved or cleared by the [place] FDA and has been authorized for detection and/or diagnosis of SARS-CoV-2 by FDA under an Emergency Use Authorization (EUA). This EUA will remain in effect (meaning this test can be used) for the duration of the COVID-19 declaration under Section 564(b)(1) of the Act, 21 U.S.C. section 360bbb-3(b)(1), unless the authorization is terminated or revoked.     Resp Syncytial Virus by PCR 09/30/2022 NEGATIVE  NEGATIVE Final   Comment: (NOTE) Fact Sheet for Patients: EntrepreneurPulse.com.au  Fact Sheet for Healthcare Providers: IncredibleEmployment.be  This test  is not yet approved or cleared by the [place] and has been authorized for detection and/or diagnosis of SARS-CoV-2 by FDA under an Emergency Use Authorization (EUA). This EUA  will remain in effect (meaning this test can be used) for the duration of the COVID-19 declaration under Section 564(b)(1) of the Act, 21 U.S.C. section 360bbb-3(b)(1), unless the authorization is terminated or revoked.  Performed at [facility], [place] [address] [place], [place] [address]    Sodium 10/01/2022 136  135 - 145 mmol/L Final   Potassium 10/01/2022 3.8  3.5 - 5.1 mmol/L Final   Chloride 10/01/2022 104  98 - 111 mmol/L Final   CO2 10/01/2022 27  22 - 32 mmol/L Final   Glucose, Bld 10/01/2022 98  70 - 99 mg/dL Final   Glucose reference range applies only to samples taken after fasting for at least 8 hours.   BUN 10/01/2022 12  6 - 20 mg/dL Final   Creatinine, Ser 10/01/2022 0.98  0.44 - 1.00 mg/dL Final   Calcium 10/01/2022 8.9  8.9 - 10.3 mg/dL Final   GFR, Estimated 10/01/2022 >60  >60 mL/min Final   Comment: (NOTE) Calculated using the CKD-EPI Creatinine Equation (2021)    Anion gap 10/01/2022 5  5 - 15 Final   Performed at [facility], [place] [address], [place], [place] [address]   WBC 10/01/2022 5.1  4.0 - 10.5 K/uL Final   RBC 10/01/2022 4.31  3.87 - 5.11 MIL/uL Final   Hemoglobin 10/01/2022 12.7  12.0 - 15.0 g/dL Final   HCT 10/01/2022 38.8  36.0 - 46.0 % Final   MCV 10/01/2022 90.0  80.0 - 100.0 fL Final   MCH 10/01/2022 29.5  26.0 - 34.0 pg Final   MCHC 10/01/2022 32.7  30.0 - 36.0 g/dL Final   RDW 10/01/2022 12.4  11.5 - 15.5 % Final   Platelets 10/01/2022 300  150 - 400 K/uL Final   nRBC 10/01/2022 0.0  0.0 - 0.2 % Final   Performed at [place] [address] [place], [place] [address]   Lipase 10/01/2022 29  11 - 51 U/L Final   Performed at [place] [address] [place], [place] [address]   Color, Urine 10/05/2022 YELLOW  YELLOW Final   APPearance 10/05/2022 HAZY (A)  CLEAR Final   Specific Gravity, Urine 10/05/2022 1.018  1.005 - 1.030 Final   pH 10/05/2022 5.0  5.0 - 8.0 Final   Glucose, UA 10/05/2022 NEGATIVE   NEGATIVE mg/dL Final   Hgb urine dipstick 10/05/2022 NEGATIVE  NEGATIVE Final   Bilirubin Urine 10/05/2022 NEGATIVE  NEGATIVE Final   Ketones, ur 10/05/2022 NEGATIVE  NEGATIVE mg/dL Final   Protein, ur 10/05/2022 NEGATIVE  NEGATIVE mg/dL Final   Nitrite 10/05/2022 NEGATIVE  NEGATIVE Final   Leukocytes,Ua 10/05/2022 TRACE (A)  NEGATIVE Final   RBC / HPF 10/05/2022 0-5  0 - 5 RBC/hpf Final   WBC, UA 10/05/2022 0-5  0 - 5 WBC/hpf Final   Bacteria, UA 10/05/2022 RARE (A)  NONE SEEN Final   Squamous Epithelial / HPF 10/05/2022 0-5  0 - 5 Final   Mucus 10/05/2022 PRESENT   Final   Performed at [facility], [place] [address], [place], [place] [address]   Specimen Description 10/05/2022 URINE, CLEAN CATCH   Final   Special Requests 10/05/2022    Final  Value:NONE Performed at [facility], [place] [address], [place], [place] [address]    Culture 10/05/2022 10,000 COLONIES/mL MULTIPLE SPECIES PRESENT, SUGGEST RECOLLECTION (A)   Final   Report Status 10/05/2022 10/07/2022 FINAL   Final  Admission on 07/04/2022, Discharged on 07/05/2022  Component Date Value Ref Range Status   Sodium 07/04/2022 142  135 - 145 mmol/L Final   Potassium 07/04/2022 4.4  3.5 - 5.1 mmol/L Final   Chloride 07/04/2022 109  98 - 111 mmol/L Final   CO2 07/04/2022 26  22 - 32 mmol/L Final   Glucose, Bld 07/04/2022 96  70 - 99 mg/dL Final   Glucose reference range applies only to samples taken after fasting for at least 8 hours.   BUN 07/04/2022 15  6 - 20 mg/dL Final   Creatinine, Ser 07/04/2022 0.83  0.44 - 1.00 mg/dL Final   Calcium 07/04/2022 9.3  8.9 - 10.3 mg/dL Final   Total Protein 07/04/2022 7.2  6.5 - 8.1 g/dL Final   Albumin 07/04/2022 3.9  3.5 - 5.0 g/dL Final   AST 07/04/2022 23  15 - 41 U/L Final   ALT 07/04/2022 28  0 - 44 U/L Final   Alkaline Phosphatase 07/04/2022 77  38 - 126 U/L Final   Total Bilirubin 07/04/2022 0.4  0.3 - 1.2 mg/dL Final   GFR, Estimated 07/04/2022 >60  >60  mL/min Final   Comment: (NOTE) Calculated using the CKD-EPI Creatinine Equation (2021)    Anion gap 07/04/2022 7  5 - 15 Final   Performed at [facility], [place] [address], [place], [place] [address]   Alcohol, Ethyl (B) 07/04/2022 <10  <10 mg/dL Final   Comment: (NOTE) Lowest detectable limit for serum alcohol is 10 mg/dL.  For medical purposes only. Performed at [facility], [place] [address] [place], [place] [address]    WBC 07/04/2022 7.0  4.0 - 10.5 K/uL Final   RBC 07/04/2022 4.18  3.87 - 5.11 MIL/uL Final   Hemoglobin 07/04/2022 12.8  12.0 - 15.0 g/dL Final   HCT 07/04/2022 39.1  36.0 - 46.0 % Final   MCV 07/04/2022 93.5  80.0 - 100.0 fL Final   MCH 07/04/2022 30.6  26.0 - 34.0 pg Final   MCHC 07/04/2022 32.7  30.0 - 36.0 g/dL Final   RDW 07/04/2022 12.9  11.5 - 15.5 % Final   Platelets 07/04/2022 243  150 - 400 K/uL Final   nRBC 07/04/2022 0.0  0.0 - 0.2 % Final   Neutrophils Relative % 07/04/2022 43  % Final   Neutro Abs 07/04/2022 3.0  1.7 - 7.7 K/uL Final   Lymphocytes Relative 07/04/2022 50  % Final   Lymphs Abs 07/04/2022 3.5  0.7 - 4.0 K/uL Final   Monocytes Relative 07/04/2022 7  % Final   Monocytes Absolute 07/04/2022 0.5  0.1 - 1.0 K/uL Final   Eosinophils Relative 07/04/2022 0  % Final   Eosinophils Absolute 07/04/2022 0.0  0.0 - 0.5 K/uL Final   Basophils Relative 07/04/2022 0  % Final   Basophils Absolute 07/04/2022 0.0  0.0 - 0.1 K/uL Final   Immature Granulocytes 07/04/2022 0  % Final   Abs Immature Granulocytes 07/04/2022 0.01  0.00 - 0.07 K/uL Final   Performed at [facility], [place] [address], [place], [place] [address]   I-stat hCG, quantitative 07/04/2022 <5.0  <5 mIU/mL Final   Comment 3 07/04/2022  Final   Comment:   GEST. AGE      CONC.  (mIU/mL)   <=1 WEEK        5 - 50     2 WEEKS       50 - 500     3 WEEKS       100 - 10,000     4 WEEKS     1,000 - 30,000        FEMALE AND  NON-PREGNANT FEMALE:     LESS THAN 5 mIU/mL   Admission on 06/14/2022, Discharged on 06/14/2022  Component Date Value Ref Range Status   Color, UA 06/14/2022 yellow  yellow Final   Clarity, UA 06/14/2022 cloudy (A)  clear Final   Glucose, UA 06/14/2022 negative  negative mg/dL Final   Bilirubin, UA 06/14/2022 negative  negative Final   Ketones, POC UA 06/14/2022 negative  negative mg/dL Final   Spec Grav, UA 06/14/2022 1.025  1.010 - 1.025 Final   Blood, UA 06/14/2022 negative  negative Final   pH, UA 06/14/2022 7.5  5.0 - 8.0 Final   Protein Ur, POC 06/14/2022 =30 (A)  negative mg/dL Final   Urobilinogen, UA 06/14/2022 0.2  0.2 or 1.0 E.U./dL Final   Nitrite, UA 06/14/2022 Negative  Negative Final   Leukocytes, UA 06/14/2022 Negative  Negative Final   Preg Test, Ur 06/14/2022 Negative  Negative Final   Specimen Description 06/14/2022 URINE, CLEAN CATCH   Final   Special Requests 06/14/2022    Final                   Value:NONE Performed at [facility], [place] [address], [place], [place] [address]    Culture 06/14/2022 MULTIPLE SPECIES PRESENT, SUGGEST RECOLLECTION (A)   Final   Report Status 06/14/2022 06/16/2022 FINAL   Final  Admission on 06/07/2022, Discharged on 06/10/2022  Component Date Value Ref Range Status   SARS Coronavirus 2 by RT PCR 06/07/2022 NEGATIVE  NEGATIVE Final   Comment: (NOTE) SARS-CoV-2 target nucleic acids are NOT DETECTED.  The SARS-CoV-2 RNA is generally detectable in upper respiratory specimens during the acute phase of infection. The lowest concentration of SARS-CoV-2 viral copies this assay can detect is 138 copies/mL. A negative result does not preclude SARS-Cov-2 infection and should not be used as the sole basis for treatment or other patient management decisions. A negative result may occur with  improper specimen collection/handling, submission of specimen other than nasopharyngeal swab, presence of viral mutation(s) within the areas  targeted by this assay, and inadequate number of viral copies(<138 copies/mL). A negative result must be combined with clinical observations, patient history, and epidemiological information. The expected result is Negative.  Fact Sheet for Patients:  EntrepreneurPulse.com.au  Fact Sheet for Healthcare Providers:  IncredibleEmployment.be  This test is no                          t yet approved or cleared by the [place] FDA and  has been authorized for detection and/or diagnosis of SARS-CoV-2 by FDA under an Emergency Use Authorization (EUA). This EUA will remain  in effect (meaning this test can be used) for the duration of the COVID-19 declaration under Section 564(b)(1) of the Act, 21 U.S.C.section 360bbb-3(b)(1), unless the authorization is terminated  or revoked sooner.       Influenza A by PCR 06/07/2022 NEGATIVE  NEGATIVE Final   Influenza B by PCR 06/07/2022 NEGATIVE  NEGATIVE Final   Comment: (NOTE) The Xpert Xpress SARS-CoV-2/FLU/RSV plus assay is intended as an aid in the diagnosis of influenza from Nasopharyngeal swab specimens and should not be used as a sole basis for treatment. Nasal washings and aspirates are unacceptable for Xpert Xpress SARS-CoV-2/FLU/RSV testing.  Fact Sheet for Patients: EntrepreneurPulse.com.au  Fact Sheet for Healthcare Providers: IncredibleEmployment.be  This test is not yet approved or cleared by the [place] FDA and has been authorized for detection and/or diagnosis of SARS-CoV-2 by FDA under an Emergency Use Authorization (EUA). This EUA will remain in effect (meaning this test can be used) for the duration of the COVID-19 declaration under Section 564(b)(1) of the Act, 21 U.S.C. section 360bbb-3(b)(1), unless the authorization is terminated or revoked.  Performed at [facility], [place] [address], [place], [place] [address]    WBC 06/07/2022  5.7  4.0 - 10.5 K/uL Final   RBC 06/07/2022 4.39  3.87 - 5.11 MIL/uL Final   Hemoglobin 06/07/2022 13.2  12.0 - 15.0 g/dL Final   HCT 06/07/2022 40.4  36.0 - 46.0 % Final   MCV 06/07/2022 92.0  80.0 - 100.0 fL Final   MCH 06/07/2022 30.1  26.0 - 34.0 pg Final   MCHC 06/07/2022 32.7  30.0 - 36.0 g/dL Final   RDW 06/07/2022 12.4  11.5 - 15.5 % Final   Platelets 06/07/2022 308  150 - 400 K/uL Final   nRBC 06/07/2022 0.0  0.0 - 0.2 % Final   Neutrophils Relative % 06/07/2022 42  % Final   Neutro Abs 06/07/2022 2.4  1.7 - 7.7 K/uL Final   Lymphocytes Relative 06/07/2022 54  % Final   Lymphs Abs 06/07/2022 3.1  0.7 - 4.0 K/uL Final   Monocytes Relative 06/07/2022 4  % Final   Monocytes Absolute 06/07/2022 0.2  0.1 - 1.0 K/uL Final   Eosinophils Relative 06/07/2022 0  % Final   Eosinophils Absolute 06/07/2022 0.0  0.0 - 0.5 K/uL Final   Basophils Relative 06/07/2022 0  % Final   Basophils Absolute 06/07/2022 0.0  0.0 - 0.1 K/uL Final   Immature Granulocytes 06/07/2022 0  % Final   Abs Immature Granulocytes 06/07/2022 0.01  0.00 - 0.07 K/uL Final   Performed at [facility], [place] [address] [place], [place] [address]   Sodium 06/07/2022 139  135 - 145 mmol/L Final   Potassium 06/07/2022 4.0  3.5 - 5.1 mmol/L Final   Chloride 06/07/2022 104  98 - 111 mmol/L Final   CO2 06/07/2022 28  22 - 32 mmol/L Final   Glucose, Bld 06/07/2022 104 (H)  70 - 99 mg/dL Final   Glucose reference range applies only to samples taken after fasting for at least 8 hours.   BUN 06/07/2022 8  6 - 20 mg/dL Final   Creatinine, Ser 06/07/2022 0.84  0.44 - 1.00 mg/dL Final   Calcium 06/07/2022 9.1  8.9 - 10.3 mg/dL Final   Total Protein 06/07/2022 6.9  6.5 - 8.1 g/dL Final   Albumin 06/07/2022 3.7  3.5 - 5.0 g/dL Final   AST 06/07/2022 19  15 - 41 U/L Final   ALT 06/07/2022 22  0 - 44 U/L Final   Alkaline Phosphatase 06/07/2022 54  38 - 126 U/L Final   Total Bilirubin 06/07/2022 0.4  0.3 - 1.2 mg/dL Final    GFR, Estimated 06/07/2022 >60  >60 mL/min Final   Comment: (NOTE) Calculated using the CKD-EPI Creatinine Equation (2021)  Anion gap 06/07/2022 7  5 - 15 Final   Performed at [place] [address] [place], [place] [address]   Hgb A1c MFr Bld 06/07/2022 5.0  4.8 - 5.6 % Final   Comment: (NOTE) Pre diabetes:          5.7%-6.4%  Diabetes:              >6.4%  Glycemic control for   <7.0% adults with diabetes    Mean Plasma Glucose 06/07/2022 96.8  mg/dL Final   Performed at [facility], [place] [address] [place], [place] [address]   TSH 06/07/2022 1.620  0.350 - 4.500 uIU/mL Final   Comment: Performed by a 3rd Generation assay with a functional sensitivity of <=0.01 uIU/mL. Performed at [facility], [place] [address], [place], [place] [address]    RPR Ser Ql 06/07/2022 NON REACTIVE  NON REACTIVE Final   Performed at [facility], [place] [address], [place], [place] [address]   Color, Urine 06/07/2022 YELLOW  YELLOW Final   APPearance 06/07/2022 HAZY (A)  CLEAR Final   Specific Gravity, Urine 06/07/2022 1.018  1.005 - 1.030 Final   pH 06/07/2022 7.0  5.0 - 8.0 Final   Glucose, UA 06/07/2022 NEGATIVE  NEGATIVE mg/dL Final   Hgb urine dipstick 06/07/2022 NEGATIVE  NEGATIVE Final   Bilirubin Urine 06/07/2022 NEGATIVE  NEGATIVE Final   Ketones, ur 06/07/2022 NEGATIVE  NEGATIVE mg/dL Final   Protein, ur 06/07/2022 NEGATIVE  NEGATIVE mg/dL Final   Nitrite 06/07/2022 NEGATIVE  NEGATIVE Final   Leukocytes,Ua 06/07/2022 NEGATIVE  NEGATIVE Final   Performed at [facility], [place] [address], [place], [place] [address]   Cholesterol 06/07/2022 164  0 - 200 mg/dL Final   Triglycerides 06/07/2022 158 (H)  <150 mg/dL Final   HDL 06/07/2022 44  >40 mg/dL Final   Total CHOL/HDL Ratio 06/07/2022 3.7  RATIO Final   VLDL 06/07/2022 32  0 - 40 mg/dL Final   LDL Cholesterol 06/07/2022 88  0 - 99 mg/dL Final   Comment:        Total Cholesterol/HDL:CHD Risk Coronary  Heart Disease Risk Table                     Men   Women  1/2 Average Risk   3.4   3.3  Average Risk       5.0   4.4  2 X Average Risk   9.6   7.1  3 X Average Risk  23.4   11.0        Use the calculated Patient Ratio above and the CHD Risk Table to determine the patient's CHD Risk.        ATP III CLASSIFICATION (LDL):  <100     mg/dL   Optimal  100-129  mg/dL   Near or Above                    Optimal  130-159  mg/dL   Borderline  160-189  mg/dL   High  >190     mg/dL   Very High Performed at [place] [address] [place], [place] [address]    HIV Screen 4th Generation wRfx 06/07/2022 Non Reactive  Non Reactive Final   Performed at [facility], [place] [address], [place], [place] [address]   SARSCOV2ONAVIRUS 2 AG 06/07/2022 NEGATIVE  NEGATIVE Final   Comment: (NOTE)  SARS-CoV-2 antigen NOT DETECTED.   Negative results are presumptive.  Negative results do not preclude SARS-CoV-2 infection and should not be used as the sole basis for treatment or other patient management decisions, including infection  control decisions, particularly in the presence of clinical signs and  symptoms consistent with COVID-19, or in those who have been in contact with the virus.  Negative results must be combined with clinical observations, patient history, and epidemiological information. The expected result is Negative.  Fact Sheet for Patients: https://www.jennings-kim.com/  Fact Sheet for Healthcare Providers: https://alexander-rogers.biz/  This test is not yet approved or cleared by the [place] FDA and  has been authorized for detection and/or diagnosis of SARS-CoV-2 by FDA under an Emergency Use Authorization (EUA).  This EUA will remain in effect (meaning this test can be used) for the duration of  the COV                          ID-19 declaration under Section 564(b)(1) of the Act, 21 U.S.C. section 360bbb-3(b)(1), unless the  authorization is terminated or revoked sooner.     POC Amphetamine UR 06/07/2022 None Detected  NONE DETECTED (Cut Off Level 1000 ng/mL) Final   POC Secobarbital (BAR) 06/07/2022 None Detected  NONE DETECTED (Cut Off Level 300 ng/mL) Final   POC Buprenorphine (BUP) 06/07/2022 None Detected  NONE DETECTED (Cut Off Level 10 ng/mL) Final   POC Oxazepam (BZO) 06/07/2022 None Detected  NONE DETECTED (Cut Off Level 300 ng/mL) Final   POC Cocaine UR 06/07/2022 None Detected  NONE DETECTED (Cut Off Level 300 ng/mL) Final   POC Methamphetamine UR 06/07/2022 None Detected  NONE DETECTED (Cut Off Level 1000 ng/mL) Final   POC Morphine 06/07/2022 None Detected  NONE DETECTED (Cut Off Level 300 ng/mL) Final   POC Methadone UR 06/07/2022 None Detected  NONE DETECTED (Cut Off Level 300 ng/mL) Final   POC Oxycodone UR 06/07/2022 None Detected  NONE DETECTED (Cut Off Level 100 ng/mL) Final   POC Marijuana UR 06/07/2022 None Detected  NONE DETECTED (Cut Off Level 50 ng/mL) Final   Preg Test, Ur 06/08/2022 NEGATIVE  NEGATIVE Final   Comment:        THE SENSITIVITY OF THIS METHODOLOGY IS >20 mIU/mL. Performed at [facility], [address], [place], [place] [address]    Preg Test, Ur 06/08/2022 NEGATIVE  NEGATIVE Final   Comment:        THE SENSITIVITY OF THIS METHODOLOGY IS >24 mIU/mL     Blood Alcohol level:  Lab Results  Component Value Date   ETH <10 07/04/2022   ETH <10 02/07/2022    Metabolic Disorder Labs: Lab Results  Component Value Date   HGBA1C 5.7 (H) 10/15/2022   MPG 117 10/15/2022   MPG 96.8 06/07/2022   No results found for: "PROLACTIN" Lab Results  Component Value Date   CHOL 181 07/25/2022   TRIG 118 07/25/2022   HDL 45 07/25/2022   CHOLHDL 4.0 07/25/2022   VLDL 24 07/25/2022   LDLCALC 112 (H) 07/25/2022   LDLCALC 88 06/07/2022    Therapeutic Lab Levels: No results found for: "LITHIUM" No results found for: "VALPROATE" No results found for:  "CBMZ"  Physical Findings   GAD-7    Flowsheet Row Office Visit from 12/17/2021 in [facility]  Total GAD-7 Score 17      PHQ2-9    Flowsheet Row [facility]  [facility]  PHQ-2 Total Score 0 1 2  PHQ-9 Total Score -- 2 8      Flowsheet Row ED [facility]20[facility] Most recent reading at 11/05/2022 10:11 AM ED [facility]/07[facility]20[facility] Most recent reading at 10/07/2022 12:18 PM ED [facility]2022 [facility] Most recent reading at 10/06/2022  9:38 AM  C-SSRS RISK CATEGORY No Risk No Risk No Risk        Musculoskeletal  Strength & Muscle Tone: within normal limits Gait & Station: normal Patient leans: N/A  Psychiatric Specialty Exam  Presentation  General Appearance:  Appropriate for Environment; Casual; Fairly Groomed  Eye Contact: Good  Speech: Clear and Coherent; Normal Rate  Speech Volume: Normal  Handedness: Right   Mood and Affect  Mood: -- ("tired")  Affect: Blunt   Thought Process  Thought Processes: Coherent; Goal Directed; Linear  Descriptions of Associations:Intact  Orientation:Full (Time, Place and Person)  Thought Content:Logical  Diagnosis of Schizophrenia or Schizoaffective disorder in past: Yes  Duration of Psychotic Symptoms: Greater than six months   Hallucinations:Hallucinations: None  Ideas of Reference:None  Suicidal Thoughts:Suicidal Thoughts: No  Homicidal Thoughts:Homicidal Thoughts: No   Sensorium  Memory: Immediate Good  Judgment: Fair  Insight: Fair   Art therapistxecutive Functions  Concentration: Good  Attention Span: Good  Recall: Good  Fund of  Knowledge: Good  Language: Good   Psychomotor Activity  Psychomotor Activity: Psychomotor Activity: Normal   Assets  Assets: Communication Skills; Desire for Improvement; Financial Resources/Insurance; Physical Health; Resilience   Sleep  Sleep: Sleep: Good   No data recorded  Physical Exam  Physical Exam Constitutional:      General: She is not in acute distress.    Appearance: She is not ill-appearing, toxic-appearing or diaphoretic.  Eyes:     General: No scleral icterus. Cardiovascular:     Rate and Rhythm: Normal rate.  Pulmonary:     Effort: Pulmonary effort is normal. No respiratory distress.  Neurological:     Mental Status: She is alert and oriented to person, place, and time.  Psychiatric:        Attention and Perception: Attention and perception normal.        Speech: Speech normal.        Behavior: Behavior normal. Behavior is cooperative.        Thought Content: Thought content normal.        Cognition and Memory: Cognition and memory normal.     Blood pressure (!) 99/57, pulse 95, temperature 98 F (36.7 C), temperature source Oral, resp. rate 18, height 5\' 1"  (1.549 m), weight 198 lb (89.8 kg), SpO2 100 %. Body mass index is 37.41 kg/m.  Treatment Plan Summary: Plan Treatment team meeting on 11/07/22. TOC/DSS is seeking respite ALF. There is a longer -term ALF that is willing to accept Luna KitchensLatasha but that ALF is not registered in [place] [facility] and this process will take 4 to 6 weeks.    Princess BruinsJulie Atzel Mccambridge, DO 11/07/2022 9:03 AM

## 2022-11-07 NOTE — ED Notes (Signed)
Pt A&O x 4, no distress noted, calm & cooperative.  Interactive with staff.  Monitoring for safety.

## 2022-11-08 ENCOUNTER — Ambulatory Visit (HOSPITAL_COMMUNITY): Payer: Medicare HMO | Admitting: Clinical

## 2022-11-08 DIAGNOSIS — F17200 Nicotine dependence, unspecified, uncomplicated: Secondary | ICD-10-CM | POA: Diagnosis not present

## 2022-11-08 DIAGNOSIS — N390 Urinary tract infection, site not specified: Secondary | ICD-10-CM | POA: Diagnosis not present

## 2022-11-08 DIAGNOSIS — E118 Type 2 diabetes mellitus with unspecified complications: Secondary | ICD-10-CM | POA: Diagnosis not present

## 2022-11-08 DIAGNOSIS — F209 Schizophrenia, unspecified: Secondary | ICD-10-CM | POA: Diagnosis not present

## 2022-11-08 DIAGNOSIS — F333 Major depressive disorder, recurrent, severe with psychotic symptoms: Secondary | ICD-10-CM | POA: Diagnosis not present

## 2022-11-08 DIAGNOSIS — F419 Anxiety disorder, unspecified: Secondary | ICD-10-CM | POA: Diagnosis not present

## 2022-11-08 DIAGNOSIS — Z1152 Encounter for screening for COVID-19: Secondary | ICD-10-CM | POA: Diagnosis not present

## 2022-11-08 DIAGNOSIS — N9489 Other specified conditions associated with female genital organs and menstrual cycle: Secondary | ICD-10-CM | POA: Diagnosis not present

## 2022-11-08 DIAGNOSIS — F331 Major depressive disorder, recurrent, moderate: Secondary | ICD-10-CM | POA: Diagnosis not present

## 2022-11-08 DIAGNOSIS — E039 Hypothyroidism, unspecified: Secondary | ICD-10-CM | POA: Diagnosis not present

## 2022-11-08 NOTE — ED Notes (Signed)
Pt a/o x4. Denies SI/HI/AVH. No noted distress. Will --=7Z1. N bvc` 0

## 2022-11-08 NOTE — ED Notes (Signed)
Pt awake & drawing at present, no distress noted.  Monitoring for safety.

## 2022-11-08 NOTE — ED Notes (Signed)
Pt sleeping@this time. Breathing even and unlabored. Will continue to monitor for safety 

## 2022-11-08 NOTE — Care Management (Signed)
OBS Care Management   Writer was informed by the IDD Care Manager that the Treatment Team Meetng will be switcfhed to November 11, 2022 at [facility] coordinated so that Gelena was able to speak to her IDD Care Manager (Merci) with any follow up questions regarding her placement.  Aldea shared with the IDD Care Manager that she was previously approved for independent living with [facility] in the past and would like to be considered for independent living again if she is able to be discharged from the [facility] in a more expedient manner.   The IDD Care Manager informed Chesley that she would look in to the possibility and get back to her at the upcoming meeting.

## 2022-11-08 NOTE — ED Notes (Signed)
Pt a/o x 4 . Denies SI/HI/AVH. No noted distress. Intrusive with staff, redirected as needed. Will continue to monitor for safety

## 2022-11-08 NOTE — ED Provider Notes (Signed)
Behavioral Health Progress Note  Date and Time: 11/08/2022 9:04 AM Name: Nicole [name] MRN:  [id number]  Nicole [name] is a 32 y/o female with PMH of SCZ unspecified, MDD with psychosis, who presented voluntarily to [facility] (07/23/2022) via [facility] after a verbal altercation with [name] (not patient's legal guardian) for transient SI. This is patient's fourth visit to [facility] for similar concerns this year. Patient has been dismissed from previous group home due to threatening SI and HI, then leaving the group home. 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  Subjective:   Patient was seen awake, sitting up on the unit, comfortable, no acute distress. Patient reported stable and appropriate sleep and appetite. She denied pain or other somatic concerns. She is amenable to taking metformin, denied any reservations about it. Still tolerating medications without side effects.  Denied active and passive SI. Denied HI/AVH. Contracted to safety.  Sleep: Good Appetite:  Good  Review of Systems  Respiratory:  Negative for shortness of breath.   Cardiovascular:  Negative for chest pain.  Gastrointestinal:  Negative for nausea and vomiting.  Neurological:  Negative for dizziness and headaches.     Diagnosis:  Final diagnoses:  Tobacco use disorder  Schizophrenia, unspecified type (HCC)  MDD (major depressive disorder), recurrent episode, moderate (HCC)  Hypothyroidism, unspecified type    Total Time spent with patient: 15 minutes  Past Psychiatric History: SCZ unspecified, MDD with psychosis, anxiety, depression, tobacco use Past Medical History: Hypothroidism Family History: Hypertension - father diabetes  Family Psychiatric  History: See H&P Social History: See H&P   Additional Social History:     Current Medications:  Current Facility-Administered Medications  Medication Dose Route Frequency Provider Last Rate Last Admin   acetaminophen (TYLENOL) tablet 650 mg   650 mg Oral Q6H PRN Marlou Sa, NP   650 mg at 11/04/22 2154   alum & mag hydroxide-simeth (MAALOX/MYLANTA) 200-200-20 MG/5ML suspension 30 mL  30 mL Oral Q4H PRN Marlou Sa, NP   30 mL at 10/26/22 1755   ARIPiprazole ER (ABILIFY MAINTENA) 400 MG prefilled syringe 400 mg  400 mg Intramuscular Q28 days Princess Bruins, DO   400 mg at 10/25/22 0920   cetaphil lotion   Topical PRN Princess Bruins, DO       fluticasone [facility]) 50 MCG/ACT nasal spray 1 spray  1 spray Each Nare QHS Princess Bruins, DO   1 spray at 11/07/22 2141   hydrOXYzine (ATARAX) tablet 25 mg  25 mg Oral TID PRN Lorri Frederick, MD   25 mg at 11/07/22 2143   ketoconazole (NIZORAL) 2 % cream   Topical BID Princess Bruins, DO   Given at 11/07/22 2142   levothyroxine (SYNTHROID) tablet 100 mcg  100 mcg Oral Once Byungura, Veronique M, NP       levothyroxine (SYNTHROID) tablet 100 mcg  100 mcg Oral Q0600 Marlou Sa, NP   100 mcg at 11/08/22 0610   lidocaine (LIDODERM) 5 % 1 patch  1 patch Transdermal Q24H Princess Bruins, DO   1 patch at 11/07/22 0911   loratadine (CLARITIN) tablet 10 mg  10 mg Oral Daily Carrion-Carrero, Margely, MD   10 mg at 11/07/22 0912   magnesium hydroxide (MILK OF MAGNESIA) suspension 30 mL  30 mL Oral Daily PRN Marlou Sa, NP   30 mL at 10/12/22 2145   metFORMIN (GLUCOPHAGE) tablet 500 mg  500 mg Oral Q breakfast Lorri Frederick, MD   500 mg at 11/06/22  [id number]   nicotine (NICODERM CQ - dosed in mg/24 hours) patch 14 mg  14 mg Transdermal Daily PRN Carrion-Carrero, Margely, MD       ondansetron (ZOFRAN-ODT) disintegrating tablet 4 mg  4 mg Oral Q8H PRN Carrion-Carrero, Margely, MD   4 mg at 11/01/22 2246   Oxcarbazepine (TRILEPTAL) tablet 300 mg  300 mg Oral BID Byungura, Veronique M, NP   300 mg at 11/07/22 2142   pantoprazole (PROTONIX) EC tablet 40 mg  40 mg Oral Daily Carrion-Carrero, Margely, MD   40 mg at 11/07/22 0911   polyethylene glycol (MIRALAX /  GLYCOLAX) packet 17 g  17 g Oral Daily Princess Bruinsguyen, Clemente Dewey, DO   17 g at 11/07/22 0911   QUEtiapine (SEROQUEL) tablet 400 mg  400 mg Oral BID Rayburn GoByungura, Veronique M, NP   400 mg at 11/07/22 2142   sertraline (ZOLOFT) tablet 150 mg  150 mg Oral Daily Carrion-Carrero, Margely, MD   150 mg at 11/07/22 0911   sodium chloride (OCEAN) 0.65 % nasal spray 1 spray  1 spray Each Nare Daily Princess BruinsNguyen, Nataniel Gasper, DO   1 spray at 11/07/22 0911   traZODone (DESYREL) tablet 100 mg  100 mg Oral QHS Olin PiaByungura, Veronique M, NP   100 mg at 11/07/22 2143   traZODone (DESYREL) tablet 50 mg  50 mg Oral QHS PRN Marlou SaByungura, Veronique M, NP   50 mg at 11/07/22 2143   valACYclovir (VALTREX) tablet 500 mg  500 mg Oral Daily Carrion-Carrero, Karle StarchMargely, MD   500 mg at 11/07/22 0911   zinc oxide (BALMEX) 11.3 % cream   Topical PRN Princess BruinsNguyen, Carey Lafon, DO       Current Outpatient Medications  Medication Sig Dispense Refill   ABILIFY MAINTENA 400 MG PRSY prefilled syringe 400 mg every 28 (twenty-eight) days.     cetirizine (ZYRTEC) 10 MG tablet Take 10 mg by mouth daily.     cyclobenzaprine (FLEXERIL) 10 MG tablet Take 1 tablet (10 mg total) by mouth 2 (two) times daily as needed for muscle spasms. 20 tablet 0   fluticasone (FLONASE) 50 MCG/ACT nasal spray Place 1 spray into both nostrils daily.     meloxicam (MOBIC) 15 MG tablet Take 15 mg by mouth daily.     nitrofurantoin, macrocrystal-monohydrate, (MACROBID) 100 MG capsule Take 1 capsule (100 mg total) by mouth 2 (two) times daily. 10 capsule 0   ondansetron (ZOFRAN-ODT) 4 MG disintegrating tablet Take 1 tablet (4 mg total) by mouth every 8 (eight) hours as needed for nausea or vomiting. 20 tablet 0   Oxcarbazepine (TRILEPTAL) 300 MG tablet Take 1 tablet (300 mg total) by mouth 2 (two) times daily. 60 tablet 0   QUEtiapine (SEROQUEL) 400 MG tablet Take 1 tablet (400 mg total) by mouth 2 (two) times daily. 60 tablet 0   sertraline (ZOLOFT) 50 MG tablet Take 3 tablets (150 mg total) by mouth in the  morning. 90 tablet 0   traZODone (DESYREL) 100 MG tablet Take 1 tablet (100 mg total) by mouth at bedtime. 30 tablet 0   valACYclovir (VALTREX) 500 MG tablet Take 500 mg by mouth daily.      Labs  Lab Results:  Admission on 10/07/2022  Component Date Value Ref Range Status   Hgb A1c MFr Bld 10/15/2022 5.7 (H)  4.8 - 5.6 % Final   Comment: (NOTE)         Prediabetes: 5.7 - 6.4         Diabetes: >6.4  Glycemic control for adults with diabetes: <7.0    Mean Plasma Glucose 10/15/2022 117  mg/dL Final   Comment: (NOTE) Performed At: [facility] [address] [place], [place] [id number] Jolene Schimke MD WU:[id number]   Office Visit on 10/21/2022  Component Date Value Ref Range Status   SARSCOV2ONAVIRUS 2 AG 11/01/2022 NEGATIVE  NEGATIVE Final   Comment: (NOTE) SARS-CoV-2 antigen NOT DETECTED.   Negative results are presumptive.  Negative results do not preclude SARS-CoV-2 infection and should not be used as the sole basis for treatment or other patient management decisions, including infection  control decisions, particularly in the presence of clinical signs and  symptoms consistent with COVID-19, or in those who have been in contact with the virus.  Negative results must be combined with clinical observations, patient history, and epidemiological information. The expected result is Negative.  Fact Sheet for Patients: https://www.jennings-kim.com/  Fact Sheet for Healthcare Providers: https://alexander-rogers.biz/  This test is not yet approved or cleared by the [place] FDA and  has been authorized for detection and/or diagnosis of SARS-CoV-2 by FDA under an Emergency Use Authorization (EUA).  This EUA will remain in effect (meaning this test can be used) for the duration of  the COV                          ID-19 declaration under Section 564(b)(1) of the Act, 21 U.S.C. section 360bbb-3(b)(1), unless the authorization  is terminated or revoked sooner.    Admission on 10/07/2022, Discharged on 10/07/2022  Component Date Value Ref Range Status   Color, Urine 10/07/2022 YELLOW  YELLOW Final   APPearance 10/07/2022 HAZY (A)  CLEAR Final   Specific Gravity, Urine 10/07/2022 1.011  1.005 - 1.030 Final   pH 10/07/2022 7.0  5.0 - 8.0 Final   Glucose, UA 10/07/2022 NEGATIVE  NEGATIVE mg/dL Final   Hgb urine dipstick 10/07/2022 SMALL (A)  NEGATIVE Final   Bilirubin Urine 10/07/2022 NEGATIVE  NEGATIVE Final   Ketones, ur 10/07/2022 NEGATIVE  NEGATIVE mg/dL Final   Protein, ur 95/62/1308 NEGATIVE  NEGATIVE mg/dL Final   Nitrite 65/78/4696 NEGATIVE  NEGATIVE Final   Leukocytes,Ua 10/07/2022 LARGE (A)  NEGATIVE Final   RBC / HPF 10/07/2022 0-5  0 - 5 RBC/hpf Final   WBC, UA 10/07/2022 0-5  0 - 5 WBC/hpf Final   Bacteria, UA 10/07/2022 FEW (A)  NONE SEEN Final   Squamous Epithelial / HPF 10/07/2022 11-20  0 - 5 Final   Performed at [facility], [address], [place], [place] [address]   I-stat hCG, quantitative 10/07/2022 <5.0  <5 mIU/mL Final   Comment 3 10/07/2022          Final   Comment:   GEST. AGE      CONC.  (mIU/mL)   <=1 WEEK        5 - 50     2 WEEKS       50 - 500     3 WEEKS       100 - 10,000     4 WEEKS     1,000 - 30,000        FEMALE AND NON-PREGNANT FEMALE:     LESS THAN 5 mIU/mL    Sodium 10/07/2022 138  135 - 145 mmol/L Final   Potassium 10/07/2022 4.3  3.5 - 5.1 mmol/L Final   Chloride 10/07/2022 104  98 - 111 mmol/L Final   CO2 10/07/2022  28  22 - 32 mmol/L Final   Glucose, Bld 10/07/2022 90  70 - 99 mg/dL Final   Glucose reference range applies only to samples taken after fasting for at least 8 hours.   BUN 10/07/2022 8  6 - 20 mg/dL Final   Creatinine, Ser 10/07/2022 0.96  0.44 - 1.00 mg/dL Final   Calcium 07/37/1062 9.1  8.9 - 10.3 mg/dL Final   GFR, Estimated 10/07/2022 >60  >60 mL/min Final   Comment: (NOTE) Calculated using the CKD-EPI Creatinine Equation (2021)     Anion gap 10/07/2022 6  5 - 15 Final   Performed at [facility], [address] [place], [place] [address]   WBC 10/07/2022 5.8  4.0 - 10.5 K/uL Final   RBC 10/07/2022 4.36  3.87 - 5.11 MIL/uL Final   Hemoglobin 10/07/2022 12.9  12.0 - 15.0 g/dL Final   HCT 46/27/0350 40.0  36.0 - 46.0 % Final   MCV 10/07/2022 91.7  80.0 - 100.0 fL Final   MCH 10/07/2022 29.6  26.0 - 34.0 pg Final   MCHC 10/07/2022 32.3  30.0 - 36.0 g/dL Final   RDW 09/38/1829 12.4  11.5 - 15.5 % Final   Platelets 10/07/2022 295  150 - 400 K/uL Final   nRBC 10/07/2022 0.0  0.0 - 0.2 % Final   Performed at [facility], [address] [place], [place] [address]   SARS Coronavirus 2 by RT PCR 10/07/2022 NEGATIVE  NEGATIVE Final   Comment: (NOTE) SARS-CoV-2 target nucleic acids are NOT DETECTED.  The SARS-CoV-2 RNA is generally detectable in upper respiratory specimens during the acute phase of infection. The lowest concentration of SARS-CoV-2 viral copies this assay can detect is 138 copies/mL. A negative result does not preclude SARS-Cov-2 infection and should not be used as the sole basis for treatment or other patient management decisions. A negative result may occur with  improper specimen collection/handling, submission of specimen other than nasopharyngeal swab, presence of viral mutation(s) within the areas targeted by this assay, and inadequate number of viral copies(<138 copies/mL). A negative result must be combined with clinical observations, patient history, and epidemiological information. The expected result is Negative.  Fact Sheet for Patients:  BloggerCourse.com  Fact Sheet for Healthcare Providers:  SeriousBroker.it  This test is no                          t yet approved or cleared by the [place] FDA and  has been authorized for detection and/or diagnosis of SARS-CoV-2 by FDA under an Emergency Use Authorization (EUA). This  EUA will remain  in effect (meaning this test can be used) for the duration of the COVID-19 declaration under Section 564(b)(1) of the Act, 21 U.S.C.section 360bbb-3(b)(1), unless the authorization is terminated  or revoked sooner.       Influenza A by PCR 10/07/2022 NEGATIVE  NEGATIVE Final   Influenza B by PCR 10/07/2022 NEGATIVE  NEGATIVE Final   Comment: (NOTE) The Xpert Xpress SARS-CoV-2/FLU/RSV plus assay is intended as an aid in the diagnosis of influenza from Nasopharyngeal swab specimens and should not be used as a sole basis for treatment. Nasal washings and aspirates are unacceptable for Xpert Xpress SARS-CoV-2/FLU/RSV testing.  Fact Sheet for Patients: BloggerCourse.com  Fact Sheet for Healthcare Providers: SeriousBroker.it  This test is not yet approved or cleared by the [place] FDA and has been authorized for detection and/or diagnosis of SARS-CoV-2 by  FDA under an Emergency Use Authorization (EUA). This EUA will remain in effect (meaning this test can be used) for the duration of the COVID-19 declaration under Section 564(b)(1) of the Act, 21 U.S.C. section 360bbb-3(b)(1), unless the authorization is terminated or revoked.     Resp Syncytial Virus by PCR 10/07/2022 NEGATIVE  NEGATIVE Final   Comment: (NOTE) Fact Sheet for Patients: BloggerCourse.com  Fact Sheet for Healthcare Providers: SeriousBroker.it  This test is not yet approved or cleared by the [place] FDA and has been authorized for detection and/or diagnosis of SARS-CoV-2 by FDA under an Emergency Use Authorization (EUA). This EUA will remain in effect (meaning this test can be used) for the duration of the COVID-19 declaration under Section 564(b)(1) of the Act, 21 U.S.C. section 360bbb-3(b)(1), unless the authorization is terminated or revoked.  Performed at [facility],  [address], [place], [place] [address]    Specimen Description 10/07/2022 URINE, CLEAN CATCH   Final   Special Requests 10/07/2022    Final                   Value:NONE Performed at [facility], [address] [place], [place] [address]    Culture 10/07/2022 MULTIPLE SPECIES PRESENT, SUGGEST RECOLLECTION (A)   Final   Report Status 10/07/2022 10/08/2022 FINAL   Final  Admission on 07/25/2022, Discharged on 10/07/2022  Component Date Value Ref Range Status   SARS Coronavirus 2 by RT PCR 07/25/2022 NEGATIVE  NEGATIVE Final   Comment: (NOTE) SARS-CoV-2 target nucleic acids are NOT DETECTED.  The SARS-CoV-2 RNA is generally detectable in upper respiratory specimens during the acute phase of infection. The lowest concentration of SARS-CoV-2 viral copies this assay can detect is 138 copies/mL. A negative result does not preclude SARS-Cov-2 infection and should not be used as the sole basis for treatment or other patient management decisions. A negative result may occur with  improper specimen collection/handling, submission of specimen other than nasopharyngeal swab, presence of viral mutation(s) within the areas targeted by this assay, and inadequate number of viral copies(<138 copies/mL). A negative result must be combined with clinical observations, patient history, and epidemiological information. The expected result is Negative.  Fact Sheet for Patients:  BloggerCourse.com  Fact Sheet for Healthcare Providers:  SeriousBroker.it  This test is no                          t yet approved or cleared by the [place] FDA and  has been authorized for detection and/or diagnosis of SARS-CoV-2 by FDA under an Emergency Use Authorization (EUA). This EUA will remain  in effect (meaning this test can be used) for the duration of the COVID-19 declaration under Section 564(b)(1) of the Act, 21 U.S.C.section 360bbb-3(b)(1), unless  the authorization is terminated  or revoked sooner.       Influenza A by PCR 07/25/2022 NEGATIVE  NEGATIVE Final   Influenza B by PCR 07/25/2022 NEGATIVE  NEGATIVE Final   Comment: (NOTE) The Xpert Xpress SARS-CoV-2/FLU/RSV plus assay is intended as an aid in the diagnosis of influenza from Nasopharyngeal swab specimens and should not be used as a sole basis for treatment. Nasal washings and aspirates are unacceptable for Xpert Xpress SARS-CoV-2/FLU/RSV testing.  Fact Sheet for Patients: BloggerCourse.com  Fact Sheet for Healthcare Providers: SeriousBroker.it  This test is not yet approved or cleared by the [place] FDA and has been authorized for detection and/or diagnosis  of SARS-CoV-2 by FDA under an Emergency Use Authorization (EUA). This EUA will remain in effect (meaning this test can be used) for the duration of the COVID-19 declaration under Section 564(b)(1) of the Act, 21 U.S.C. section 360bbb-3(b)(1), unless the authorization is terminated or revoked.  Performed at [facility], [place] [address], [place], [place] [address]    WBC 07/25/2022 8.3  4.0 - 10.5 K/uL Final   RBC 07/25/2022 4.44  3.87 - 5.11 MIL/uL Final   Hemoglobin 07/25/2022 13.7  12.0 - 15.0 g/dL Final   HCT 07/25/2022 40.2  36.0 - 46.0 % Final   MCV 07/25/2022 90.5  80.0 - 100.0 fL Final   MCH 07/25/2022 30.9  26.0 - 34.0 pg Final   MCHC 07/25/2022 34.1  30.0 - 36.0 g/dL Final   RDW 07/25/2022 12.2  11.5 - 15.5 % Final   Platelets 07/25/2022 248  150 - 400 K/uL Final   nRBC 07/25/2022 0.0  0.0 - 0.2 % Final   Neutrophils Relative % 07/25/2022 43  % Final   Neutro Abs 07/25/2022 3.6  1.7 - 7.7 K/uL Final   Lymphocytes Relative 07/25/2022 52  % Final   Lymphs Abs 07/25/2022 4.2 (H)  0.7 - 4.0 K/uL Final   Monocytes Relative 07/25/2022 5  % Final   Monocytes Absolute 07/25/2022 0.4  0.1 - 1.0 K/uL Final   Eosinophils Relative 07/25/2022 0   % Final   Eosinophils Absolute 07/25/2022 0.0  0.0 - 0.5 K/uL Final   Basophils Relative 07/25/2022 0  % Final   Basophils Absolute 07/25/2022 0.0  0.0 - 0.1 K/uL Final   Immature Granulocytes 07/25/2022 0  % Final   Abs Immature Granulocytes 07/25/2022 0.02  0.00 - 0.07 K/uL Final   Performed at [facility], [place] [address] [place], [place] [address]   Sodium 07/25/2022 138  135 - 145 mmol/L Final   Potassium 07/25/2022 4.0  3.5 - 5.1 mmol/L Final   Chloride 07/25/2022 104  98 - 111 mmol/L Final   CO2 07/25/2022 29  22 - 32 mmol/L Final   Glucose, Bld 07/25/2022 83  70 - 99 mg/dL Final   Glucose reference range applies only to samples taken after fasting for at least 8 hours.   BUN 07/25/2022 11  6 - 20 mg/dL Final   Creatinine, Ser 07/25/2022 0.97  0.44 - 1.00 mg/dL Final   Calcium 07/25/2022 9.2  8.9 - 10.3 mg/dL Final   Total Protein 07/25/2022 7.0  6.5 - 8.1 g/dL Final   Albumin 07/25/2022 3.8  3.5 - 5.0 g/dL Final   AST 07/25/2022 18  15 - 41 U/L Final   ALT 07/25/2022 22  0 - 44 U/L Final   Alkaline Phosphatase 07/25/2022 64  38 - 126 U/L Final   Total Bilirubin 07/25/2022 0.2 (L)  0.3 - 1.2 mg/dL Final   GFR, Estimated 07/25/2022 >60  >60 mL/min Final   Comment: (NOTE) Calculated using the CKD-EPI Creatinine Equation (2021)    Anion gap 07/25/2022 5  5 - 15 Final   Performed at [facility] [address], [place], [place] [address]   POC Amphetamine UR 07/25/2022 None Detected  NONE DETECTED (Cut Off Level 1000 ng/mL) Preliminary   POC Secobarbital (BAR) 07/25/2022 None Detected  NONE DETECTED (Cut Off Level 300 ng/mL) Preliminary   POC Buprenorphine (BUP) 07/25/2022 None Detected  NONE DETECTED (Cut Off Level 10 ng/mL) Preliminary   POC Oxazepam (BZO) 07/25/2022 None Detected  NONE DETECTED (Cut Off Level 300 ng/mL) Preliminary   POC Cocaine UR 07/25/2022 None Detected  NONE DETECTED (Cut Off Level 300 ng/mL) Preliminary   POC Methamphetamine UR 07/25/2022  None Detected  NONE DETECTED (Cut Off Level 1000 ng/mL) Preliminary   POC Morphine 07/25/2022 None Detected  NONE DETECTED (Cut Off Level 300 ng/mL) Preliminary   POC Methadone UR 07/25/2022 None Detected  NONE DETECTED (Cut Off Level 300 ng/mL) Preliminary   POC Oxycodone UR 07/25/2022 None Detected  NONE DETECTED (Cut Off Level 100 ng/mL) Preliminary   POC Marijuana UR 07/25/2022 None Detected  NONE DETECTED (Cut Off Level 50 ng/mL) Preliminary   SARSCOV2ONAVIRUS 2 AG 07/25/2022 NEGATIVE  NEGATIVE Final   Comment: (NOTE) SARS-CoV-2 antigen NOT DETECTED.   Negative results are presumptive.  Negative results do not preclude SARS-CoV-2 infection and should not be used as the sole basis for treatment or other patient management decisions, including infection  control decisions, particularly in the presence of clinical signs and  symptoms consistent with COVID-19, or in those who have been in contact with the virus.  Negative results must be combined with clinical observations, patient history, and epidemiological information. The expected result is Negative.  Fact Sheet for Patients: HandmadeRecipes.com.cy  Fact Sheet for Healthcare Providers: FuneralLife.at  This test is not yet approved or cleared by the [place] FDA and  has been authorized for detection and/or diagnosis of SARS-CoV-2 by FDA under an Emergency Use Authorization (EUA).  This EUA will remain in effect (meaning this test can be used) for the duration of  the COV                          ID-19 declaration under Section 564(b)(1) of the Act, 21 U.S.C. section 360bbb-3(b)(1), unless the authorization is terminated or revoked sooner.     Cholesterol 07/25/2022 181  0 - 200 mg/dL Final   Triglycerides 07/25/2022 118  <150 mg/dL Final   HDL 07/25/2022 45  >40 mg/dL Final   Total CHOL/HDL Ratio 07/25/2022 4.0  RATIO Final   VLDL 07/25/2022 24  0 - 40 mg/dL Final   LDL  Cholesterol 07/25/2022 112 (H)  0 - 99 mg/dL Final   Comment:        Total Cholesterol/HDL:CHD Risk Coronary Heart Disease Risk Table                     Men   Women  1/2 Average Risk   3.4   3.3  Average Risk       5.0   4.4  2 X Average Risk   9.6   7.1  3 X Average Risk  23.4   11.0        Use the calculated Patient Ratio above and the CHD Risk Table to determine the patient's CHD Risk.        ATP III CLASSIFICATION (LDL):  <100     mg/dL   Optimal  100-129  mg/dL   Near or Above                    Optimal  130-159  mg/dL   Borderline  160-189  mg/dL   High  >190     mg/dL   Very High Performed at [facility] [address] [place], [place] [address]    TSH 07/25/2022 6.668 (H)  0.350 - 4.500 uIU/mL Final   Comment: Performed by a  3rd Generation assay with a functional sensitivity of <=0.01 uIU/mL. Performed at [facility], [address] [place], [place] [address]    Glucose-Capillary 07/26/2022 104 (H)  70 - 99 mg/dL Final   Glucose reference range applies only to samples taken after fasting for at least 8 hours.   T3, Free 07/31/2022 2.3  2.0 - 4.4 pg/mL Final   Comment: (NOTE) Performed At: [facility] [address] [place], [place] [id number] Jolene Schimke MD [id number]    Free T4 07/31/2022 0.60 (L)  0.61 - 1.12 ng/dL Final   Comment: (NOTE) Biotin ingestion may interfere with free T4 tests. If the results are inconsistent with the TSH level, previous test results, or the clinical presentation, then consider biotin interference. If needed, order repeat testing after stopping biotin. Performed at [facility], [address], [place], [place] [address]    Glucose-Capillary 08/29/2022 100 (H)  70 - 99 mg/dL Final   Glucose reference range applies only to samples taken after fasting for at least 8 hours.   TSH 09/05/2022 0.793  0.350 - 4.500 uIU/mL Final   Comment: Performed by a 3rd Generation assay with a functional  sensitivity of <=0.01 uIU/mL. Performed at [facility], [address], [place], [place] [address]    Free T4 09/05/2022 0.73  0.61 - 1.12 ng/dL Final   Comment: (NOTE) Biotin ingestion may interfere with free T4 tests. If the results are inconsistent with the TSH level, previous test results, or the clinical presentation, then consider biotin interference. If needed, order repeat testing after stopping biotin. Performed at [facility], [address], [place], [place] [address]    Preg Test, Ur 09/06/2022 Negative  Negative Final   Preg Test, Ur 09/06/2022 NEGATIVE  NEGATIVE Final   Comment:        THE SENSITIVITY OF THIS METHODOLOGY IS >24 mIU/mL    Preg Test, Ur 09/05/2022 NEGATIVE  NEGATIVE Final   Comment:        THE SENSITIVITY OF THIS METHODOLOGY IS >24 mIU/mL    Sodium 09/13/2022 136  135 - 145 mmol/L Final   Potassium 09/13/2022 4.5  3.5 - 5.1 mmol/L Final   Chloride 09/13/2022 105  98 - 111 mmol/L Final   CO2 09/13/2022 21 (L)  22 - 32 mmol/L Final   Glucose, Bld 09/13/2022 111 (H)  70 - 99 mg/dL Final   Glucose reference range applies only to samples taken after fasting for at least 8 hours.   BUN 09/13/2022 14  6 - 20 mg/dL Final   Creatinine, Ser 09/13/2022 0.92  0.44 - 1.00 mg/dL Final   Calcium 84/13/2440 9.1  8.9 - 10.3 mg/dL Final   GFR, Estimated 09/13/2022 >60  >60 mL/min Final   Comment: (NOTE) Calculated using the CKD-EPI Creatinine Equation (2021)    Anion gap 09/13/2022 10  5 - 15 Final   Performed at [facility], [address], [place], [place] [address]   Vitamin B-12 09/13/2022 585  180 - 914 pg/mL Final   Comment: (NOTE) This assay is not validated for testing neonatal or myeloproliferative syndrome specimens for Vitamin B12 levels. Performed at [facility], [address] [place], [place] [address]    Color, Urine 09/14/2022 YELLOW  YELLOW Final   APPearance 09/14/2022 HAZY (A)  CLEAR Final   Specific Gravity,  Urine 09/14/2022 1.025  1.005 - 1.030 Final   pH 09/14/2022 5.0  5.0 - 8.0  Final   Glucose, UA 09/14/2022 NEGATIVE  NEGATIVE mg/dL Final   Hgb urine dipstick 09/14/2022 NEGATIVE  NEGATIVE Final   Bilirubin Urine 09/14/2022 NEGATIVE  NEGATIVE Final   Ketones, ur 09/14/2022 NEGATIVE  NEGATIVE mg/dL Final   Protein, ur 16/10/960412/11/2021 NEGATIVE  NEGATIVE mg/dL Final   Nitrite 54/09/811912/11/2021 NEGATIVE  NEGATIVE Final   Leukocytes,Ua 09/14/2022 TRACE (A)  NEGATIVE Final   RBC / HPF 09/14/2022 0-5  0 - 5 RBC/hpf Final   WBC, UA 09/14/2022 0-5  0 - 5 WBC/hpf Final   Bacteria, UA 09/14/2022 RARE (A)  NONE SEEN Final   Squamous Epithelial / HPF 09/14/2022 0-5  0 - 5 Final   Mucus 09/14/2022 PRESENT   Final   Performed at [facility], [address] [place], [place] [address]   Glucose-Capillary 09/16/2022 105 (H)  70 - 99 mg/dL Final   Glucose reference range applies only to samples taken after fasting for at least 8 hours.   SARS Coronavirus 2 by RT PCR 09/30/2022 NEGATIVE  NEGATIVE Final   Comment: (NOTE) SARS-CoV-2 target nucleic acids are NOT DETECTED.  The SARS-CoV-2 RNA is generally detectable in upper respiratory specimens during the acute phase of infection. The lowest concentration of SARS-CoV-2 viral copies this assay can detect is 138 copies/mL. A negative result does not preclude SARS-Cov-2 infection and should not be used as the sole basis for treatment or other patient management decisions. A negative result may occur with  improper specimen collection/handling, submission of specimen other than nasopharyngeal swab, presence of viral mutation(s) within the areas targeted by this assay, and inadequate number of viral copies(<138 copies/mL). A negative result must be combined with clinical observations, patient history, and epidemiological information. The expected result is Negative.  Fact Sheet for Patients:  BloggerCourse.comhttps://www.fda.gov/media/152166/download  Fact Sheet for Healthcare  Providers:  SeriousBroker.ithttps://www.fda.gov/media/152162/download  This test is no                          t yet approved or cleared by the [place] FDA and  has been authorized for detection and/or diagnosis of SARS-CoV-2 by FDA under an Emergency Use Authorization (EUA). This EUA will remain  in effect (meaning this test can be used) for the duration of the COVID-19 declaration under Section 564(b)(1) of the Act, 21 U.S.C.section 360bbb-3(b)(1), unless the authorization is terminated  or revoked sooner.       Influenza A by PCR 09/30/2022 NEGATIVE  NEGATIVE Final   Influenza B by PCR 09/30/2022 NEGATIVE  NEGATIVE Final   Comment: (NOTE) The Xpert Xpress SARS-CoV-2/FLU/RSV plus assay is intended as an aid in the diagnosis of influenza from Nasopharyngeal swab specimens and should not be used as a sole basis for treatment. Nasal washings and aspirates are unacceptable for Xpert Xpress SARS-CoV-2/FLU/RSV testing.  Fact Sheet for Patients: BloggerCourse.comhttps://www.fda.gov/media/152166/download  Fact Sheet for Healthcare Providers: SeriousBroker.ithttps://www.fda.gov/media/152162/download  This test is not yet approved or cleared by the [place] FDA and has been authorized for detection and/or diagnosis of SARS-CoV-2 by FDA under an Emergency Use Authorization (EUA). This EUA will remain in effect (meaning this test can be used) for the duration of the COVID-19 declaration under Section 564(b)(1) of the Act, 21 U.S.C. section 360bbb-3(b)(1), unless the authorization is terminated or revoked.     Resp Syncytial Virus by PCR 09/30/2022 NEGATIVE  NEGATIVE Final   Comment: (NOTE) Fact Sheet for Patients: BloggerCourse.comhttps://www.fda.gov/media/152166/download  Fact Sheet for Healthcare Providers: SeriousBroker.ithttps://www.fda.gov/media/152162/download  This test  is not yet approved or cleared by the [place] and has been authorized for detection and/or diagnosis of SARS-CoV-2 by FDA under an Emergency Use Authorization  (EUA). This EUA will remain in effect (meaning this test can be used) for the duration of the COVID-19 declaration under Section 564(b)(1) of the Act, 21 U.S.C. section 360bbb-3(b)(1), unless the authorization is terminated or revoked.  Performed at [facility], [address], [place], [place] [address]    Sodium 10/01/2022 136  135 - 145 mmol/L Final   Potassium 10/01/2022 3.8  3.5 - 5.1 mmol/L Final   Chloride 10/01/2022 104  98 - 111 mmol/L Final   CO2 10/01/2022 27  22 - 32 mmol/L Final   Glucose, Bld 10/01/2022 98  70 - 99 mg/dL Final   Glucose reference range applies only to samples taken after fasting for at least 8 hours.   BUN 10/01/2022 12  6 - 20 mg/dL Final   Creatinine, Ser 10/01/2022 0.98  0.44 - 1.00 mg/dL Final   Calcium 60/45/4098 8.9  8.9 - 10.3 mg/dL Final   GFR, Estimated 10/01/2022 >60  >60 mL/min Final   Comment: (NOTE) Calculated using the CKD-EPI Creatinine Equation (2021)    Anion gap 10/01/2022 5  5 - 15 Final   Performed at [facility], [address], [place], [place] [address]   WBC 10/01/2022 5.1  4.0 - 10.5 K/uL Final   RBC 10/01/2022 4.31  3.87 - 5.11 MIL/uL Final   Hemoglobin 10/01/2022 12.7  12.0 - 15.0 g/dL Final   HCT 78/29/5621 38.8  36.0 - 46.0 % Final   MCV 10/01/2022 90.0  80.0 - 100.0 fL Final   MCH 10/01/2022 29.5  26.0 - 34.0 pg Final   MCHC 10/01/2022 32.7  30.0 - 36.0 g/dL Final   RDW 30/86/5784 12.4  11.5 - 15.5 % Final   Platelets 10/01/2022 300  150 - 400 K/uL Final   nRBC 10/01/2022 0.0  0.0 - 0.2 % Final   Performed at [facility], [address], [place], [place] [address]   Lipase 10/01/2022 29  11 - 51 U/L Final   Performed at [facility], [address], [place], [place] [address]   Color, Urine 10/05/2022 YELLOW  YELLOW Final   APPearance 10/05/2022 HAZY (A)  CLEAR Final   Specific Gravity, Urine 10/05/2022 1.018  1.005 - 1.030 Final   pH 10/05/2022 5.0  5.0 - 8.0 Final   Glucose, UA  10/05/2022 NEGATIVE  NEGATIVE mg/dL Final   Hgb urine dipstick 10/05/2022 NEGATIVE  NEGATIVE Final   Bilirubin Urine 10/05/2022 NEGATIVE  NEGATIVE Final   Ketones, ur 10/05/2022 NEGATIVE  NEGATIVE mg/dL Final   Protein, ur 32/44/0102 NEGATIVE  NEGATIVE mg/dL Final   Nitrite 72/53/6644 NEGATIVE  NEGATIVE Final   Leukocytes,Ua 10/05/2022 TRACE (A)  NEGATIVE Final   RBC / HPF 10/05/2022 0-5  0 - 5 RBC/hpf Final   WBC, UA 10/05/2022 0-5  0 - 5 WBC/hpf Final   Bacteria, UA 10/05/2022 RARE (A)  NONE SEEN Final   Squamous Epithelial / HPF 10/05/2022 0-5  0 - 5 Final   Mucus 10/05/2022 PRESENT   Final   Performed at [facility], [address] [place], [place] [address]   Specimen Description 10/05/2022 URINE, CLEAN CATCH   Final   Special Requests 10/05/2022    Final  Value:NONE Performed at [facility], [address], [place], [place] [address]    Culture 10/05/2022 10,000 COLONIES/mL MULTIPLE SPECIES PRESENT, SUGGEST RECOLLECTION (A)   Final   Report Status 10/05/2022 10/07/2022 FINAL   Final  Admission on 07/04/2022, Discharged on 07/05/2022  Component Date Value Ref Range Status   Sodium 07/04/2022 142  135 - 145 mmol/L Final   Potassium 07/04/2022 4.4  3.5 - 5.1 mmol/L Final   Chloride 07/04/2022 109  98 - 111 mmol/L Final   CO2 07/04/2022 26  22 - 32 mmol/L Final   Glucose, Bld 07/04/2022 96  70 - 99 mg/dL Final   Glucose reference range applies only to samples taken after fasting for at least 8 hours.   BUN 07/04/2022 15  6 - 20 mg/dL Final   Creatinine, Ser 07/04/2022 0.83  0.44 - 1.00 mg/dL Final   Calcium 82/99/3716 9.3  8.9 - 10.3 mg/dL Final   Total Protein 96/78/9381 7.2  6.5 - 8.1 g/dL Final   Albumin 01/75/1025 3.9  3.5 - 5.0 g/dL Final   AST 85/27/7824 23  15 - 41 U/L Final   ALT 07/04/2022 28  0 - 44 U/L Final   Alkaline Phosphatase 07/04/2022 77  38 - 126 U/L Final   Total Bilirubin 07/04/2022 0.4  0.3 - 1.2 mg/dL Final   GFR, Estimated  07/04/2022 >60  >60 mL/min Final   Comment: (NOTE) Calculated using the CKD-EPI Creatinine Equation (2021)    Anion gap 07/04/2022 7  5 - 15 Final   Performed at [facility], [address] [place], [place] [address]   Alcohol, Ethyl (B) 07/04/2022 <10  <10 mg/dL Final   Comment: (NOTE) Lowest detectable limit for serum alcohol is 10 mg/dL.  For medical purposes only. Performed at [facility], [address] [place], [place] [address]    WBC 07/04/2022 7.0  4.0 - 10.5 K/uL Final   RBC 07/04/2022 4.18  3.87 - 5.11 MIL/uL Final   Hemoglobin 07/04/2022 12.8  12.0 - 15.0 g/dL Final   HCT 54/00/8676 39.1  36.0 - 46.0 % Final   MCV 07/04/2022 93.5  80.0 - 100.0 fL Final   MCH 07/04/2022 30.6  26.0 - 34.0 pg Final   MCHC 07/04/2022 32.7  30.0 - 36.0 g/dL Final   RDW 19/50/9326 12.9  11.5 - 15.5 % Final   Platelets 07/04/2022 243  150 - 400 K/uL Final   nRBC 07/04/2022 0.0  0.0 - 0.2 % Final   Neutrophils Relative % 07/04/2022 43  % Final   Neutro Abs 07/04/2022 3.0  1.7 - 7.7 K/uL Final   Lymphocytes Relative 07/04/2022 50  % Final   Lymphs Abs 07/04/2022 3.5  0.7 - 4.0 K/uL Final   Monocytes Relative 07/04/2022 7  % Final   Monocytes Absolute 07/04/2022 0.5  0.1 - 1.0 K/uL Final   Eosinophils Relative 07/04/2022 0  % Final   Eosinophils Absolute 07/04/2022 0.0  0.0 - 0.5 K/uL Final   Basophils Relative 07/04/2022 0  % Final   Basophils Absolute 07/04/2022 0.0  0.0 - 0.1 K/uL Final   Immature Granulocytes 07/04/2022 0  % Final   Abs Immature Granulocytes 07/04/2022 0.01  0.00 - 0.07 K/uL Final   Performed at [facility], [address] [place], [place] [address]   I-stat hCG, quantitative 07/04/2022 <5.0  <5 mIU/mL Final   Comment 3 07/04/2022  Final   Comment:   GEST. AGE      CONC.  (mIU/mL)   <=1 WEEK        5 - 50     2 WEEKS       50 - 500     3 WEEKS       100 - 10,000     4 WEEKS     1,000 - 30,000         FEMALE AND NON-PREGNANT FEMALE:     LESS THAN 5 mIU/mL   Admission on 06/14/2022, Discharged on 06/14/2022  Component Date Value Ref Range Status   Color, UA 06/14/2022 yellow  yellow Final   Clarity, UA 06/14/2022 cloudy (A)  clear Final   Glucose, UA 06/14/2022 negative  negative mg/dL Final   Bilirubin, UA 32/09/2481 negative  negative Final   Ketones, POC UA 06/14/2022 negative  negative mg/dL Final   Spec Grav, UA 50/12/7046 1.025  1.010 - 1.025 Final   Blood, UA 06/14/2022 negative  negative Final   pH, UA 06/14/2022 7.5  5.0 - 8.0 Final   Protein Ur, POC 06/14/2022 =30 (A)  negative mg/dL Final   Urobilinogen, UA 06/14/2022 0.2  0.2 or 1.0 E.U./dL Final   Nitrite, UA 88/91/6945 Negative  Negative Final   Leukocytes, UA 06/14/2022 Negative  Negative Final   Preg Test, Ur 06/14/2022 Negative  Negative Final   Specimen Description 06/14/2022 URINE, CLEAN CATCH   Final   Special Requests 06/14/2022    Final                   Value:NONE Performed at [facility], [address], [place], [place] [address]    Culture 06/14/2022 MULTIPLE SPECIES PRESENT, SUGGEST RECOLLECTION (A)   Final   Report Status 06/14/2022 06/16/2022 FINAL   Final  Admission on 06/07/2022, Discharged on 06/10/2022  Component Date Value Ref Range Status   SARS Coronavirus 2 by RT PCR 06/07/2022 NEGATIVE  NEGATIVE Final   Comment: (NOTE) SARS-CoV-2 target nucleic acids are NOT DETECTED.  The SARS-CoV-2 RNA is generally detectable in upper respiratory specimens during the acute phase of infection. The lowest concentration of SARS-CoV-2 viral copies this assay can detect is 138 copies/mL. A negative result does not preclude SARS-Cov-2 infection and should not be used as the sole basis for treatment or other patient management decisions. A negative result may occur with  improper specimen collection/handling, submission of specimen other than nasopharyngeal swab, presence of viral mutation(s) within  the areas targeted by this assay, and inadequate number of viral copies(<138 copies/mL). A negative result must be combined with clinical observations, patient history, and epidemiological information. The expected result is Negative.  Fact Sheet for Patients:  BloggerCourse.com  Fact Sheet for Healthcare Providers:  SeriousBroker.it  This test is no                          t yet approved or cleared by the [place] FDA and  has been authorized for detection and/or diagnosis of SARS-CoV-2 by FDA under an Emergency Use Authorization (EUA). This EUA will remain  in effect (meaning this test can be used) for the duration of the COVID-19 declaration under Section 564(b)(1) of the Act, 21 U.S.C.section 360bbb-3(b)(1), unless the authorization is terminated  or revoked sooner.       Influenza A by PCR 06/07/2022 NEGATIVE  NEGATIVE Final   Influenza B by PCR 06/07/2022 NEGATIVE  NEGATIVE Final   Comment: (NOTE) The Xpert Xpress SARS-CoV-2/FLU/RSV plus assay is intended as an aid in the diagnosis of influenza from Nasopharyngeal swab specimens and should not be used as a sole basis for treatment. Nasal washings and aspirates are unacceptable for Xpert Xpress SARS-CoV-2/FLU/RSV testing.  Fact Sheet for Patients: BloggerCourse.comhttps://www.fda.gov/media/152166/download  Fact Sheet for Healthcare Providers: SeriousBroker.ithttps://www.fda.gov/media/152162/download  This test is not yet approved or cleared by the [place] FDA and has been authorized for detection and/or diagnosis of SARS-CoV-2 by FDA under an Emergency Use Authorization (EUA). This EUA will remain in effect (meaning this test can be used) for the duration of the COVID-19 declaration under Section 564(b)(1) of the Act, 21 U.S.C. section 360bbb-3(b)(1), unless the authorization is terminated or revoked.  Performed at [facility], [address] [place], [place] [address]    WBC  06/07/2022 5.7  4.0 - 10.5 K/uL Final   RBC 06/07/2022 4.39  3.87 - 5.11 MIL/uL Final   Hemoglobin 06/07/2022 13.2  12.0 - 15.0 g/dL Final   HCT 91/47/829508/25/2023 40.4  36.0 - 46.0 % Final   MCV 06/07/2022 92.0  80.0 - 100.0 fL Final   MCH 06/07/2022 30.1  26.0 - 34.0 pg Final   MCHC 06/07/2022 32.7  30.0 - 36.0 g/dL Final   RDW 62/13/086508/25/2023 12.4  11.5 - 15.5 % Final   Platelets 06/07/2022 308  150 - 400 K/uL Final   nRBC 06/07/2022 0.0  0.0 - 0.2 % Final   Neutrophils Relative % 06/07/2022 42  % Final   Neutro Abs 06/07/2022 2.4  1.7 - 7.7 K/uL Final   Lymphocytes Relative 06/07/2022 54  % Final   Lymphs Abs 06/07/2022 3.1  0.7 - 4.0 K/uL Final   Monocytes Relative 06/07/2022 4  % Final   Monocytes Absolute 06/07/2022 0.2  0.1 - 1.0 K/uL Final   Eosinophils Relative 06/07/2022 0  % Final   Eosinophils Absolute 06/07/2022 0.0  0.0 - 0.5 K/uL Final   Basophils Relative 06/07/2022 0  % Final   Basophils Absolute 06/07/2022 0.0  0.0 - 0.1 K/uL Final   Immature Granulocytes 06/07/2022 0  % Final   Abs Immature Granulocytes 06/07/2022 0.01  0.00 - 0.07 K/uL Final   Performed at [facility], [address] [place], [place] [address]   Sodium 06/07/2022 139  135 - 145 mmol/L Final   Potassium 06/07/2022 4.0  3.5 - 5.1 mmol/L Final   Chloride 06/07/2022 104  98 - 111 mmol/L Final   CO2 06/07/2022 28  22 - 32 mmol/L Final   Glucose, Bld 06/07/2022 104 (H)  70 - 99 mg/dL Final   Glucose reference range applies only to samples taken after fasting for at least 8 hours.   BUN 06/07/2022 8  6 - 20 mg/dL Final   Creatinine, Ser 06/07/2022 0.84  0.44 - 1.00 mg/dL Final   Calcium 62/95/284108/25/2023 9.1  8.9 - 10.3 mg/dL Final   Total Protein 32/44/010208/25/2023 6.9  6.5 - 8.1 g/dL Final   Albumin 72/53/664408/25/2023 3.7  3.5 - 5.0 g/dL Final   AST 03/47/425908/25/2023 19  15 - 41 U/L Final   ALT 06/07/2022 22  0 - 44 U/L Final   Alkaline Phosphatase 06/07/2022 54  38 - 126 U/L Final   Total Bilirubin 06/07/2022 0.4  0.3 - 1.2 mg/dL  Final   GFR, Estimated 06/07/2022 >60  >60 mL/min Final   Comment: (NOTE) Calculated using the CKD-EPI Creatinine Equation (2021)  Anion gap 06/07/2022 7  5 - 15 Final   Performed at [facility], [address], [place], [place] [address]   Hgb A1c MFr Bld 06/07/2022 5.0  4.8 - 5.6 % Final   Comment: (NOTE) Pre diabetes:          5.7%-6.4%  Diabetes:              >6.4%  Glycemic control for   <7.0% adults with diabetes    Mean Plasma Glucose 06/07/2022 96.8  mg/dL Final   Performed at [facility], [address], [place], [place] [address]   TSH 06/07/2022 1.620  0.350 - 4.500 uIU/mL Final   Comment: Performed by a 3rd Generation assay with a functional sensitivity of <=0.01 uIU/mL. Performed at [facility], [address], [place], [place] [address]    RPR Ser Ql 06/07/2022 NON REACTIVE  NON REACTIVE Final   Performed at [facility], [address], [place], [place] [address]   Color, Urine 06/07/2022 YELLOW  YELLOW Final   APPearance 06/07/2022 HAZY (A)  CLEAR Final   Specific Gravity, Urine 06/07/2022 1.018  1.005 - 1.030 Final   pH 06/07/2022 7.0  5.0 - 8.0 Final   Glucose, UA 06/07/2022 NEGATIVE  NEGATIVE mg/dL Final   Hgb urine dipstick 06/07/2022 NEGATIVE  NEGATIVE Final   Bilirubin Urine 06/07/2022 NEGATIVE  NEGATIVE Final   Ketones, ur 06/07/2022 NEGATIVE  NEGATIVE mg/dL Final   Protein, ur 44/10/270 NEGATIVE  NEGATIVE mg/dL Final   Nitrite 53/66/4403 NEGATIVE  NEGATIVE Final   Leukocytes,Ua 06/07/2022 NEGATIVE  NEGATIVE Final   Performed at [facility], [address], [place], [place] [address]   Cholesterol 06/07/2022 164  0 - 200 mg/dL Final   Triglycerides 95/63/8756 158 (H)  <150 mg/dL Final   HDL 43/32/9518 44  >40 mg/dL Final   Total CHOL/HDL Ratio 06/07/2022 3.7  RATIO Final   VLDL 06/07/2022 32  0 - 40 mg/dL Final   LDL Cholesterol 06/07/2022 88  0 - 99 mg/dL Final   Comment:        Total Cholesterol/HDL:CHD  Risk Coronary Heart Disease Risk Table                     Men   Women  1/2 Average Risk   3.4   3.3  Average Risk       5.0   4.4  2 X Average Risk   9.6   7.1  3 X Average Risk  23.4   11.0        Use the calculated Patient Ratio above and the CHD Risk Table to determine the patient's CHD Risk.        ATP III CLASSIFICATION (LDL):  <100     mg/dL   Optimal  841-660  mg/dL   Near or Above                    Optimal  130-159  mg/dL   Borderline  630-160  mg/dL   High  >109     mg/dL   Very High Performed at [facility], [address] [place], [place] [address]    HIV Screen 4th Generation wRfx 06/07/2022 Non Reactive  Non Reactive Final   Performed at [facility], [address], [place], [place] [address]   SARSCOV2ONAVIRUS 2 AG 06/07/2022 NEGATIVE  NEGATIVE Final   Comment: (NOTE)  SARS-CoV-2 antigen NOT DETECTED.   Negative results are presumptive.  Negative results do not preclude SARS-CoV-2 infection and should not be used as the sole basis for treatment or other patient management decisions, including infection  control decisions, particularly in the presence of clinical signs and  symptoms consistent with COVID-19, or in those who have been in contact with the virus.  Negative results must be combined with clinical observations, patient history, and epidemiological information. The expected result is Negative.  Fact Sheet for Patients: https://www.jennings-kim.com/  Fact Sheet for Healthcare Providers: https://alexander-rogers.biz/  This test is not yet approved or cleared by the [place] FDA and  has been authorized for detection and/or diagnosis of SARS-CoV-2 by FDA under an Emergency Use Authorization (EUA).  This EUA will remain in effect (meaning this test can be used) for the duration of  the COV                          ID-19 declaration under Section 564(b)(1) of the Act, 21 U.S.C. section 360bbb-3(b)(1), unless  the authorization is terminated or revoked sooner.     POC Amphetamine UR 06/07/2022 None Detected  NONE DETECTED (Cut Off Level 1000 ng/mL) Final   POC Secobarbital (BAR) 06/07/2022 None Detected  NONE DETECTED (Cut Off Level 300 ng/mL) Final   POC Buprenorphine (BUP) 06/07/2022 None Detected  NONE DETECTED (Cut Off Level 10 ng/mL) Final   POC Oxazepam (BZO) 06/07/2022 None Detected  NONE DETECTED (Cut Off Level 300 ng/mL) Final   POC Cocaine UR 06/07/2022 None Detected  NONE DETECTED (Cut Off Level 300 ng/mL) Final   POC Methamphetamine UR 06/07/2022 None Detected  NONE DETECTED (Cut Off Level 1000 ng/mL) Final   POC Morphine 06/07/2022 None Detected  NONE DETECTED (Cut Off Level 300 ng/mL) Final   POC Methadone UR 06/07/2022 None Detected  NONE DETECTED (Cut Off Level 300 ng/mL) Final   POC Oxycodone UR 06/07/2022 None Detected  NONE DETECTED (Cut Off Level 100 ng/mL) Final   POC Marijuana UR 06/07/2022 None Detected  NONE DETECTED (Cut Off Level 50 ng/mL) Final   Preg Test, Ur 06/08/2022 NEGATIVE  NEGATIVE Final   Comment:        THE SENSITIVITY OF THIS METHODOLOGY IS >20 mIU/mL. Performed at [facility], [address], [place], [place] [address]    Preg Test, Ur 06/08/2022 NEGATIVE  NEGATIVE Final   Comment:        THE SENSITIVITY OF THIS METHODOLOGY IS >24 mIU/mL     Blood Alcohol level:  Lab Results  Component Value Date   ETH <10 07/04/2022   ETH <10 02/07/2022    Metabolic Disorder Labs: Lab Results  Component Value Date   HGBA1C 5.7 (H) 10/15/2022   MPG 117 10/15/2022   MPG 96.8 06/07/2022   No results found for: "PROLACTIN" Lab Results  Component Value Date   CHOL 181 07/25/2022   TRIG 118 07/25/2022   HDL 45 07/25/2022   CHOLHDL 4.0 07/25/2022   VLDL 24 07/25/2022   LDLCALC 112 (H) 07/25/2022   LDLCALC 88 06/07/2022    Therapeutic Lab Levels: No results found for: "LITHIUM" No results found for: "VALPROATE" No results found for:  "CBMZ"  Physical Findings   GAD-7    Flowsheet Row Office Visit from 12/17/2021 in [facility] at [facility]  Total GAD-7 Score 17      PHQ2-9    Flowsheet Row ED from  [facility]07[facility]20[facility]/07[facility]20[facility]  PHQ-2 Total Score 0 1 2  PHQ-9 Total Score -- 2 8      Flowsheet Row ED [facility] 10[facility]07[facility]2022 [facility] Most recent reading at 11/07/2022  9:32 AM ED from 10/07/2022 [facility] Most recent reading at 10/07/2022 12:18 PM ED from 07/25/2022 [facility] Most recent reading at 10/06/2022  9:38 AM  C-SSRS RISK CATEGORY No Risk No Risk No Risk        Musculoskeletal  Strength & Muscle Tone: within normal limits Gait & Station: normal Patient leans: N/A  Psychiatric Specialty Exam  Presentation  General Appearance:  Appropriate for Environment; Casual; Fairly Groomed  Eye Contact: Good  Speech: Clear and Coherent; Normal Rate  Speech Volume: Normal  Handedness: Right   Mood and Affect  Mood: -- ("tired")  Affect: Blunt   Thought Process  Thought Processes: Coherent; Goal Directed; Linear  Descriptions of Associations:Intact  Orientation:Full (Time, Place and Person)  Thought Content:Logical  Diagnosis of Schizophrenia or Schizoaffective disorder in past: Yes  Duration of Psychotic Symptoms: Greater than six months   Hallucinations:No data recorded  Ideas of Reference:None  Suicidal Thoughts:No data recorded  Homicidal Thoughts:No data recorded   Sensorium  Memory: Immediate Good  Judgment: Fair  Insight: Fair   Art therapistxecutive Functions  Concentration: Good  Attention Span: Good  Recall: Good  Fund of  Knowledge: Good  Language: Good   Psychomotor Activity  Psychomotor Activity: No data recorded   Assets  Assets: Communication Skills; Desire for Improvement; Financial Resources/Insurance; Physical Health; Resilience   Sleep  Sleep: No data recorded   No data recorded  Physical Exam  Physical Exam Vitals and nursing note reviewed.  Constitutional:      General: She is not in acute distress.    Appearance: She is not ill-appearing, toxic-appearing or diaphoretic.  HENT:     Head: Normocephalic and atraumatic.  Pulmonary:     Effort: Pulmonary effort is normal. No respiratory distress.  Neurological:     General: No focal deficit present.     Mental Status: She is alert and oriented to person, place, and time.     Blood pressure 108/69, pulse 97, temperature 98.4 F (36.9 C), temperature source Oral, resp. rate 20, height 5\' 1"  (1.549 m), weight 198 lb (89.8 kg), SpO2 99 %. Body mass index is 37.41 kg/m.  Treatment Plan Summary: Patient continues to be psychiatrically and medically stable.  Dispo is still undetermined, barrier is determining her IDD funding has authorization   Princess BruinsJulie Joselin Crandell, DO 11/08/2022 9:04 AM

## 2022-11-08 NOTE — Progress Notes (Signed)
Received Nicole [name] this AM asleep in her chair bed. She woke up on her own and received breakfast. She stated feeling ok today.

## 2022-11-09 DIAGNOSIS — E039 Hypothyroidism, unspecified: Secondary | ICD-10-CM | POA: Diagnosis not present

## 2022-11-09 DIAGNOSIS — E118 Type 2 diabetes mellitus with unspecified complications: Secondary | ICD-10-CM | POA: Diagnosis not present

## 2022-11-09 DIAGNOSIS — F419 Anxiety disorder, unspecified: Secondary | ICD-10-CM | POA: Diagnosis not present

## 2022-11-09 DIAGNOSIS — N390 Urinary tract infection, site not specified: Secondary | ICD-10-CM | POA: Diagnosis not present

## 2022-11-09 DIAGNOSIS — Z1152 Encounter for screening for COVID-19: Secondary | ICD-10-CM | POA: Diagnosis not present

## 2022-11-09 DIAGNOSIS — N9489 Other specified conditions associated with female genital organs and menstrual cycle: Secondary | ICD-10-CM | POA: Diagnosis not present

## 2022-11-09 DIAGNOSIS — F17200 Nicotine dependence, unspecified, uncomplicated: Secondary | ICD-10-CM | POA: Diagnosis not present

## 2022-11-09 DIAGNOSIS — F333 Major depressive disorder, recurrent, severe with psychotic symptoms: Secondary | ICD-10-CM | POA: Diagnosis not present

## 2022-11-09 DIAGNOSIS — F209 Schizophrenia, unspecified: Secondary | ICD-10-CM | POA: Diagnosis not present

## 2022-11-09 NOTE — ED Notes (Signed)
Pt resting quietly, breathing even and unlabored.  Staff will continue to monitor for safety. 

## 2022-11-09 NOTE — ED Provider Notes (Signed)
Behavioral Health Progress Note  Date and Time: 11/09/2022 9:47 AM Name: Nicole [name] MRN:  [id number]  Nicole [name] is a 32 y/o female with PMH of SCZ unspecified, MDD with psychosis, who presented voluntarily to [facility] (07/23/2022) via [employer] after a verbal altercation with [name] (not patient's legal guardian) for transient SI. This is patient's fourth visit to [facility] for similar concerns this year. Patient has been dismissed from previous group home due to threatening SI and HI, then leaving the group home. 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  Subjective:    Pt seen on unit. She is sleeping on my approach, although awakes to name being called. Pt reports "tired" mood. Her affect is blunted. She denies suicidal, homicidal or violent ideations. She denies auditory visual hallucinations or paranoia. She denies psychiatric or physical complaints. Pt remains boarding at [facility]. Disposition is pending placement.  Diagnosis:  Final diagnoses:  Tobacco use disorder  Schizophrenia, unspecified type ([place])  MDD (major depressive disorder), recurrent episode, moderate ([place])  Hypothyroidism, unspecified type    Total Time spent with patient: 15 minutes  Past Psychiatric History: SCZ unspecified, MDD with psychosis, anxiety, depression, tobacco use Past Medical History: Hypothyroidism Family History: Hypertension - father diabetes Family Psychiatric  History: see H&P Social History: see H&P  Additional Social History:                         Sleep: Good  Appetite:  Good  Current Medications:  Current Facility-Administered Medications  Medication Dose Route Frequency Provider Last Rate Last Admin   acetaminophen (TYLENOL) tablet 650 mg  650 mg Oral Q6H PRN Haynes Kerns, NP   650 mg at 11/04/22 2154   alum & mag hydroxide-simeth (MAALOX/MYLANTA) 200-200-20 MG/5ML suspension 30 mL  30 mL Oral Q4H PRN Haynes Kerns, NP   30 mL at 10/26/22  1755   ARIPiprazole ER (ABILIFY MAINTENA) 400 MG prefilled syringe 400 mg  400 mg Intramuscular Q28 days Merrily Brittle, DO   400 mg at 10/25/22 0920   cetaphil lotion   Topical PRN Merrily Brittle, DO       fluticasone [facility]) 50 MCG/ACT nasal spray 1 spray  1 spray Each Nare QHS Merrily Brittle, DO   1 spray at 11/08/22 2107   hydrOXYzine (ATARAX) tablet 25 mg  25 mg Oral TID PRN Christene Slates, MD   25 mg at 11/08/22 2109   ketoconazole (NIZORAL) 2 % cream   Topical BID Merrily Brittle, DO   Given at 11/08/22 2107   levothyroxine (SYNTHROID) tablet 100 mcg  100 mcg Oral Once Byungura, Veronique M, NP       levothyroxine (SYNTHROID) tablet 100 mcg  100 mcg Oral Q0600 Haynes Kerns, NP   100 mcg at 11/09/22 0626   lidocaine (LIDODERM) 5 % 1 patch  1 patch Transdermal Q24H Merrily Brittle, DO   1 patch at 11/08/22 0926   loratadine (CLARITIN) tablet 10 mg  10 mg Oral Daily Carrion-Carrero, Caryl Ada, MD   10 mg at 11/08/22 0929   magnesium hydroxide (MILK OF MAGNESIA) suspension 30 mL  30 mL Oral Daily PRN Haynes Kerns, NP   30 mL at 10/12/22 2145   metFORMIN (GLUCOPHAGE) tablet 500 mg  500 mg Oral Q breakfast Carrion-Carrero, Margely, MD   500 mg at 11/08/22 0929   nicotine (NICODERM CQ - dosed in mg/24 hours) patch 14 mg  14 mg Transdermal Daily PRN Carrion-Carrero, Margely,  MD       ondansetron (ZOFRAN-ODT) disintegrating tablet 4 mg  4 mg Oral Q8H PRN Carrion-Carrero, Margely, MD   4 mg at 11/01/22 2246   Oxcarbazepine (TRILEPTAL) tablet 300 mg  300 mg Oral BID Byungura, Veronique M, NP   300 mg at 11/08/22 2108   pantoprazole (PROTONIX) EC tablet 40 mg  40 mg Oral Daily Carrion-Carrero, Caryl Ada, MD   40 mg at 11/08/22 0929   polyethylene glycol (MIRALAX / GLYCOLAX) packet 17 g  17 g Oral Daily Merrily Brittle, DO   17 g at 11/08/22 [id number]   QUEtiapine (SEROQUEL) tablet 400 mg  400 mg Oral BID [place], Veronique M, NP   400 mg at 11/08/22 2108   sertraline (ZOLOFT) tablet 150 mg   150 mg Oral Daily Carrion-Carrero, Caryl Ada, MD   150 mg at 11/08/22 0929   sodium chloride (OCEAN) 0.65 % nasal spray 1 spray  1 spray Each Nare Daily Merrily Brittle, DO   1 spray at 11/08/22 0932   traZODone (DESYREL) tablet 100 mg  100 mg Oral QHS Ronelle Nigh M, NP   100 mg at 11/08/22 2108   traZODone (DESYREL) tablet 50 mg  50 mg Oral QHS PRN Haynes Kerns, NP   50 mg at 11/08/22 2109   valACYclovir (VALTREX) tablet 500 mg  500 mg Oral Daily Carrion-Carrero, Caryl Ada, MD   500 mg at 11/08/22 0929   zinc oxide (BALMEX) 11.3 % cream   Topical PRN Merrily Brittle, DO       Current Outpatient Medications  Medication Sig Dispense Refill   ABILIFY MAINTENA 400 MG PRSY prefilled syringe 400 mg every 28 (twenty-eight) days.     cetirizine (ZYRTEC) 10 MG tablet Take 10 mg by mouth daily.     cyclobenzaprine (FLEXERIL) 10 MG tablet Take 1 tablet (10 mg total) by mouth 2 (two) times daily as needed for muscle spasms. 20 tablet 0   fluticasone (FLONASE) 50 MCG/ACT nasal spray Place 1 spray into both nostrils daily.     meloxicam (MOBIC) 15 MG tablet Take 15 mg by mouth daily.     nitrofurantoin, macrocrystal-monohydrate, (MACROBID) 100 MG capsule Take 1 capsule (100 mg total) by mouth 2 (two) times daily. 10 capsule 0   ondansetron (ZOFRAN-ODT) 4 MG disintegrating tablet Take 1 tablet (4 mg total) by mouth every 8 (eight) hours as needed for nausea or vomiting. 20 tablet 0   Oxcarbazepine (TRILEPTAL) 300 MG tablet Take 1 tablet (300 mg total) by mouth 2 (two) times daily. 60 tablet 0   QUEtiapine (SEROQUEL) 400 MG tablet Take 1 tablet (400 mg total) by mouth 2 (two) times daily. 60 tablet 0   sertraline (ZOLOFT) 50 MG tablet Take 3 tablets (150 mg total) by mouth in the morning. 90 tablet 0   traZODone (DESYREL) 100 MG tablet Take 1 tablet (100 mg total) by mouth at bedtime. 30 tablet 0   valACYclovir (VALTREX) 500 MG tablet Take 500 mg by mouth daily.      Labs  Lab Results:  Admission  on 10/07/2022  Component Date Value Ref Range Status   Hgb A1c MFr Bld 10/15/2022 5.7 (H)  4.8 - 5.6 % Final   Comment: (NOTE)         Prediabetes: 5.7 - 6.4         Diabetes: >6.4         Glycemic control for adults with diabetes: <7.0    Mean Plasma Glucose 10/15/2022 117  mg/dL Final  Comment: (NOTE) Performed At: [facility] [place], [place] [id number] Rush Farmer MD [id number]   Office Visit on 10/21/2022  Component Date Value Ref Range Status   SARSCOV2ONAVIRUS 2 AG 11/01/2022 NEGATIVE  NEGATIVE Final   Comment: (NOTE) SARS-CoV-2 antigen NOT DETECTED.   Negative results are presumptive.  Negative results do not preclude SARS-CoV-2 infection and should not be used as the sole basis for treatment or other patient management decisions, including infection  control decisions, particularly in the presence of clinical signs and  symptoms consistent with COVID-19, or in those who have been in contact with the virus.  Negative results must be combined with clinical observations, patient history, and epidemiological information. The expected result is Negative.  Fact Sheet for Patients: HandmadeRecipes.com.cy  Fact Sheet for Healthcare Providers: FuneralLife.at  This test is not yet approved or cleared by the [place] FDA and  has been authorized for detection and/or diagnosis of SARS-CoV-2 by FDA under an Emergency Use Authorization (EUA).  This EUA will remain in effect (meaning this test can be used) for the duration of  the COV                          ID-19 declaration under Section 564(b)(1) of the Act, 21 U.S.C. section 360bbb-3(b)(1), unless the authorization is terminated or revoked sooner.    Admission on 10/07/2022, Discharged on 10/07/2022  Component Date Value Ref Range Status   Color, Urine 10/07/2022 YELLOW  YELLOW Final   APPearance 10/07/2022 HAZY (A)  CLEAR Final   Specific  Gravity, Urine 10/07/2022 1.011  1.005 - 1.030 Final   pH 10/07/2022 7.0  5.0 - 8.0 Final   Glucose, UA 10/07/2022 NEGATIVE  NEGATIVE mg/dL Final   Hgb urine dipstick 10/07/2022 SMALL (A)  NEGATIVE Final   Bilirubin Urine 10/07/2022 NEGATIVE  NEGATIVE Final   Ketones, ur 10/07/2022 NEGATIVE  NEGATIVE mg/dL Final   Protein, ur 10/07/2022 NEGATIVE  NEGATIVE mg/dL Final   Nitrite 10/07/2022 NEGATIVE  NEGATIVE Final   Leukocytes,Ua 10/07/2022 LARGE (A)  NEGATIVE Final   RBC / HPF 10/07/2022 0-5  0 - 5 RBC/hpf Final   WBC, UA 10/07/2022 0-5  0 - 5 WBC/hpf Final   Bacteria, UA 10/07/2022 FEW (A)  NONE SEEN Final   Squamous Epithelial / HPF 10/07/2022 11-20  0 - 5 Final   Performed at [facility], [place] [address], [place], [place] [address]   I-stat hCG, quantitative 10/07/2022 <5.0  <5 mIU/mL Final   Comment 3 10/07/2022          Final   Comment:   GEST. AGE      CONC.  (mIU/mL)   <=1 WEEK        5 - 50     2 WEEKS       50 - 500     3 WEEKS       100 - 10,000     4 WEEKS     1,000 - 30,000        FEMALE AND NON-PREGNANT FEMALE:     LESS THAN 5 mIU/mL    Sodium 10/07/2022 138  135 - 145 mmol/L Final   Potassium 10/07/2022 4.3  3.5 - 5.1 mmol/L Final   Chloride 10/07/2022 104  98 - 111 mmol/L Final   CO2 10/07/2022 28  22 - 32 mmol/L Final   Glucose, Bld 10/07/2022 90  70 - 99 mg/dL Final  Glucose reference range applies only to samples taken after fasting for at least 8 hours.   BUN 10/07/2022 8  6 - 20 mg/dL Final   Creatinine, Ser 10/07/2022 0.96  0.44 - 1.00 mg/dL Final   Calcium 10/07/2022 9.1  8.9 - 10.3 mg/dL Final   GFR, Estimated 10/07/2022 >60  >60 mL/min Final   Comment: (NOTE) Calculated using the CKD-EPI Creatinine Equation (2021)    Anion gap 10/07/2022 6  5 - 15 Final   Performed at [facility], [place] [address] [place], [place] [address]   WBC 10/07/2022 5.8  4.0 - 10.5 K/uL Final   RBC 10/07/2022 4.36  3.87 - 5.11 MIL/uL Final   Hemoglobin 10/07/2022  12.9  12.0 - 15.0 g/dL Final   HCT 10/07/2022 40.0  36.0 - 46.0 % Final   MCV 10/07/2022 91.7  80.0 - 100.0 fL Final   MCH 10/07/2022 29.6  26.0 - 34.0 pg Final   MCHC 10/07/2022 32.3  30.0 - 36.0 g/dL Final   RDW 10/07/2022 12.4  11.5 - 15.5 % Final   Platelets 10/07/2022 295  150 - 400 K/uL Final   nRBC 10/07/2022 0.0  0.0 - 0.2 % Final   Performed at [place] [address] [place], [place] [address]   SARS Coronavirus 2 by RT PCR 10/07/2022 NEGATIVE  NEGATIVE Final   Comment: (NOTE) SARS-CoV-2 target nucleic acids are NOT DETECTED.  The SARS-CoV-2 RNA is generally detectable in upper respiratory specimens during the acute phase of infection. The lowest concentration of SARS-CoV-2 viral copies this assay can detect is 138 copies/mL. A negative result does not preclude SARS-Cov-2 infection and should not be used as the sole basis for treatment or other patient management decisions. A negative result may occur with  improper specimen collection/handling, submission of specimen other than nasopharyngeal swab, presence of viral mutation(s) within the areas targeted by this assay, and inadequate number of viral copies(<138 copies/mL). A negative result must be combined with clinical observations, patient history, and epidemiological information. The expected result is Negative.  Fact Sheet for Patients:  EntrepreneurPulse.com.au  Fact Sheet for Healthcare Providers:  IncredibleEmployment.be  This test is no                          t yet approved or cleared by the [place] FDA and  has been authorized for detection and/or diagnosis of SARS-CoV-2 by FDA under an Emergency Use Authorization (EUA). This EUA will remain  in effect (meaning this test can be used) for the duration of the COVID-19 declaration under Section 564(b)(1) of the Act, 21 U.S.C.section 360bbb-3(b)(1), unless the authorization is terminated  or revoked sooner.        Influenza A by PCR 10/07/2022 NEGATIVE  NEGATIVE Final   Influenza B by PCR 10/07/2022 NEGATIVE  NEGATIVE Final   Comment: (NOTE) The Xpert Xpress SARS-CoV-2/FLU/RSV plus assay is intended as an aid in the diagnosis of influenza from Nasopharyngeal swab specimens and should not be used as a sole basis for treatment. Nasal washings and aspirates are unacceptable for Xpert Xpress SARS-CoV-2/FLU/RSV testing.  Fact Sheet for Patients: EntrepreneurPulse.com.au  Fact Sheet for Healthcare Providers: IncredibleEmployment.be  This test is not yet approved or cleared by the [place] FDA and has been authorized for detection and/or diagnosis of SARS-CoV-2 by FDA under an Emergency Use Authorization (EUA). This EUA will remain in effect (meaning this test can be used) for the  duration of the COVID-19 declaration under Section 564(b)(1) of the Act, 21 U.S.C. section 360bbb-3(b)(1), unless the authorization is terminated or revoked.     Resp Syncytial Virus by PCR 10/07/2022 NEGATIVE  NEGATIVE Final   Comment: (NOTE) Fact Sheet for Patients: EntrepreneurPulse.com.au  Fact Sheet for Healthcare Providers: IncredibleEmployment.be  This test is not yet approved or cleared by the [place] FDA and has been authorized for detection and/or diagnosis of SARS-CoV-2 by FDA under an Emergency Use Authorization (EUA). This EUA will remain in effect (meaning this test can be used) for the duration of the COVID-19 declaration under Section 564(b)(1) of the Act, 21 U.S.C. section 360bbb-3(b)(1), unless the authorization is terminated or revoked.  Performed at [facility], [place] [address], [place], [place] [address]    Specimen Description 10/07/2022 URINE, CLEAN CATCH   Final   Special Requests 10/07/2022    Final                   Value:NONE Performed at [facility], [place] [address]  [place], [place] [address]    Culture 10/07/2022 MULTIPLE SPECIES PRESENT, SUGGEST RECOLLECTION (A)   Final   Report Status 10/07/2022 10/08/2022 FINAL   Final  Admission on 07/25/2022, Discharged on 10/07/2022  Component Date Value Ref Range Status   SARS Coronavirus 2 by RT PCR 07/25/2022 NEGATIVE  NEGATIVE Final   Comment: (NOTE) SARS-CoV-2 target nucleic acids are NOT DETECTED.  The SARS-CoV-2 RNA is generally detectable in upper respiratory specimens during the acute phase of infection. The lowest concentration of SARS-CoV-2 viral copies this assay can detect is 138 copies/mL. A negative result does not preclude SARS-Cov-2 infection and should not be used as the sole basis for treatment or other patient management decisions. A negative result may occur with  improper specimen collection/handling, submission of specimen other than nasopharyngeal swab, presence of viral mutation(s) within the areas targeted by this assay, and inadequate number of viral copies(<138 copies/mL). A negative result must be combined with clinical observations, patient history, and epidemiological information. The expected result is Negative.  Fact Sheet for Patients:  EntrepreneurPulse.com.au  Fact Sheet for Healthcare Providers:  IncredibleEmployment.be  This test is no                          t yet approved or cleared by the [place] FDA and  has been authorized for detection and/or diagnosis of SARS-CoV-2 by FDA under an Emergency Use Authorization (EUA). This EUA will remain  in effect (meaning this test can be used) for the duration of the COVID-19 declaration under Section 564(b)(1) of the Act, 21 U.S.C.section 360bbb-3(b)(1), unless the authorization is terminated  or revoked sooner.       Influenza A by PCR 07/25/2022 NEGATIVE  NEGATIVE Final   Influenza B by PCR 07/25/2022 NEGATIVE  NEGATIVE Final   Comment: (NOTE) The Xpert Xpress SARS-CoV-2/FLU/RSV  plus assay is intended as an aid in the diagnosis of influenza from Nasopharyngeal swab specimens and should not be used as a sole basis for treatment. Nasal washings and aspirates are unacceptable for Xpert Xpress SARS-CoV-2/FLU/RSV testing.  Fact Sheet for Patients: EntrepreneurPulse.com.au  Fact Sheet for Healthcare Providers: IncredibleEmployment.be  This test is not yet approved or cleared by the [place] FDA and has been authorized for detection and/or diagnosis of SARS-CoV-2 by FDA under an Emergency Use Authorization (EUA). This EUA will remain in effect (meaning this test can be  used) for the duration of the COVID-19 declaration under Section 564(b)(1) of the Act, 21 U.S.C. section 360bbb-3(b)(1), unless the authorization is terminated or revoked.  Performed at [facility], [place] [address] [place], [place] [address]    WBC 07/25/2022 8.3  4.0 - 10.5 K/uL Final   RBC 07/25/2022 4.44  3.87 - 5.11 MIL/uL Final   Hemoglobin 07/25/2022 13.7  12.0 - 15.0 g/dL Final   HCT 07/25/2022 40.2  36.0 - 46.0 % Final   MCV 07/25/2022 90.5  80.0 - 100.0 fL Final   MCH 07/25/2022 30.9  26.0 - 34.0 pg Final   MCHC 07/25/2022 34.1  30.0 - 36.0 g/dL Final   RDW 07/25/2022 12.2  11.5 - 15.5 % Final   Platelets 07/25/2022 248  150 - 400 K/uL Final   nRBC 07/25/2022 0.0  0.0 - 0.2 % Final   Neutrophils Relative % 07/25/2022 43  % Final   Neutro Abs 07/25/2022 3.6  1.7 - 7.7 K/uL Final   Lymphocytes Relative 07/25/2022 52  % Final   Lymphs Abs 07/25/2022 4.2 (H)  0.7 - 4.0 K/uL Final   Monocytes Relative 07/25/2022 5  % Final   Monocytes Absolute 07/25/2022 0.4  0.1 - 1.0 K/uL Final   Eosinophils Relative 07/25/2022 0  % Final   Eosinophils Absolute 07/25/2022 0.0  0.0 - 0.5 K/uL Final   Basophils Relative 07/25/2022 0  % Final   Basophils Absolute 07/25/2022 0.0  0.0 - 0.1 K/uL Final   Immature Granulocytes 07/25/2022 0  % Final   Abs  Immature Granulocytes 07/25/2022 0.02  0.00 - 0.07 K/uL Final   Performed at [facility], [place] [address], [place], [place] [address]   Sodium 07/25/2022 138  135 - 145 mmol/L Final   Potassium 07/25/2022 4.0  3.5 - 5.1 mmol/L Final   Chloride 07/25/2022 104  98 - 111 mmol/L Final   CO2 07/25/2022 29  22 - 32 mmol/L Final   Glucose, Bld 07/25/2022 83  70 - 99 mg/dL Final   Glucose reference range applies only to samples taken after fasting for at least 8 hours.   BUN 07/25/2022 11  6 - 20 mg/dL Final   Creatinine, Ser 07/25/2022 0.97  0.44 - 1.00 mg/dL Final   Calcium 07/25/2022 9.2  8.9 - 10.3 mg/dL Final   Total Protein 07/25/2022 7.0  6.5 - 8.1 g/dL Final   Albumin 07/25/2022 3.8  3.5 - 5.0 g/dL Final   AST 07/25/2022 18  15 - 41 U/L Final   ALT 07/25/2022 22  0 - 44 U/L Final   Alkaline Phosphatase 07/25/2022 64  38 - 126 U/L Final   Total Bilirubin 07/25/2022 0.2 (L)  0.3 - 1.2 mg/dL Final   GFR, Estimated 07/25/2022 >60  >60 mL/min Final   Comment: (NOTE) Calculated using the CKD-EPI Creatinine Equation (2021)    Anion gap 07/25/2022 5  5 - 15 Final   Performed at [facility] [address] [place], [place] [address]   POC Amphetamine UR 07/25/2022 None Detected  NONE DETECTED (Cut Off Level 1000 ng/mL) Preliminary   POC Secobarbital (BAR) 07/25/2022 None Detected  NONE DETECTED (Cut Off Level 300 ng/mL) Preliminary   POC Buprenorphine (BUP) 07/25/2022 None Detected  NONE DETECTED (Cut Off Level 10 ng/mL) Preliminary   POC Oxazepam (BZO) 07/25/2022 None Detected  NONE DETECTED (Cut Off Level 300 ng/mL) Preliminary   POC Cocaine UR 07/25/2022 None Detected  NONE DETECTED (Cut  Off Level 300 ng/mL) Preliminary   POC Methamphetamine UR 07/25/2022 None Detected  NONE DETECTED (Cut Off Level 1000 ng/mL) Preliminary   POC Morphine 07/25/2022 None Detected  NONE DETECTED (Cut Off Level 300 ng/mL) Preliminary   POC Methadone UR 07/25/2022 None Detected  NONE DETECTED (Cut  Off Level 300 ng/mL) Preliminary   POC Oxycodone UR 07/25/2022 None Detected  NONE DETECTED (Cut Off Level 100 ng/mL) Preliminary   POC Marijuana UR 07/25/2022 None Detected  NONE DETECTED (Cut Off Level 50 ng/mL) Preliminary   SARSCOV2ONAVIRUS 2 AG 07/25/2022 NEGATIVE  NEGATIVE Final   Comment: (NOTE) SARS-CoV-2 antigen NOT DETECTED.   Negative results are presumptive.  Negative results do not preclude SARS-CoV-2 infection and should not be used as the sole basis for treatment or other patient management decisions, including infection  control decisions, particularly in the presence of clinical signs and  symptoms consistent with COVID-19, or in those who have been in contact with the virus.  Negative results must be combined with clinical observations, patient history, and epidemiological information. The expected result is Negative.  Fact Sheet for Patients: HandmadeRecipes.com.cy  Fact Sheet for Healthcare Providers: FuneralLife.at  This test is not yet approved or cleared by the [place] FDA and  has been authorized for detection and/or diagnosis of SARS-CoV-2 by FDA under an Emergency Use Authorization (EUA).  This EUA will remain in effect (meaning this test can be used) for the duration of  the COV                          ID-19 declaration under Section 564(b)(1) of the Act, 21 U.S.C. section 360bbb-3(b)(1), unless the authorization is terminated or revoked sooner.     Cholesterol 07/25/2022 181  0 - 200 mg/dL Final   Triglycerides 07/25/2022 118  <150 mg/dL Final   HDL 07/25/2022 45  >40 mg/dL Final   Total CHOL/HDL Ratio 07/25/2022 4.0  RATIO Final   VLDL 07/25/2022 24  0 - 40 mg/dL Final   LDL Cholesterol 07/25/2022 112 (H)  0 - 99 mg/dL Final   Comment:        Total Cholesterol/HDL:CHD Risk Coronary Heart Disease Risk Table                     Men   Women  1/2 Average Risk   3.4   3.3  Average Risk       5.0    4.4  2 X Average Risk   9.6   7.1  3 X Average Risk  23.4   11.0        Use the calculated Patient Ratio above and the CHD Risk Table to determine the patient's CHD Risk.        ATP III CLASSIFICATION (LDL):  <100     mg/dL   Optimal  100-129  mg/dL   Near or Above                    Optimal  130-159  mg/dL   Borderline  160-189  mg/dL   High  >190     mg/dL   Very High Performed at [facility] [address] [place], [place] [address]    TSH 07/25/2022 6.668 (H)  0.350 - 4.500 uIU/mL Final   Comment: Performed by a 3rd Generation assay with a functional sensitivity of <=0.01 uIU/mL. Performed at [facility], [place] [address],  [place], [place] [address]    Glucose-Capillary 07/26/2022 104 (H)  70 - 99 mg/dL Final   Glucose reference range applies only to samples taken after fasting for at least 8 hours.   T3, Free 07/31/2022 2.3  2.0 - 4.4 pg/mL Final   Comment: (NOTE) Performed At: [facility] [place], [place] [id number] Rush Farmer MD [id number]    Free T4 07/31/2022 0.60 (L)  0.61 - 1.12 ng/dL Final   Comment: (NOTE) Biotin ingestion may interfere with free T4 tests. If the results are inconsistent with the TSH level, previous test results, or the clinical presentation, then consider biotin interference. If needed, order repeat testing after stopping biotin. Performed at [facility], [place] [address] [place], [place] [address]    Glucose-Capillary 08/29/2022 100 (H)  70 - 99 mg/dL Final   Glucose reference range applies only to samples taken after fasting for at least 8 hours.   TSH 09/05/2022 0.793  0.350 - 4.500 uIU/mL Final   Comment: Performed by a 3rd Generation assay with a functional sensitivity of <=0.01 uIU/mL. Performed at [facility], [place] [address] [place], [place] [address]    Free T4 09/05/2022 0.73  0.61 - 1.12 ng/dL Final   Comment: (NOTE) Biotin ingestion may interfere with free T4 tests. If  the results are inconsistent with the TSH level, previous test results, or the clinical presentation, then consider biotin interference. If needed, order repeat testing after stopping biotin. Performed at [facility], [place] [address] [place], [place] [address]    Preg Test, Ur 09/06/2022 Negative  Negative Final   Preg Test, Ur 09/06/2022 NEGATIVE  NEGATIVE Final   Comment:        THE SENSITIVITY OF THIS METHODOLOGY IS >24 mIU/mL    Preg Test, Ur 09/05/2022 NEGATIVE  NEGATIVE Final   Comment:        THE SENSITIVITY OF THIS METHODOLOGY IS >24 mIU/mL    Sodium 09/13/2022 136  135 - 145 mmol/L Final   Potassium 09/13/2022 4.5  3.5 - 5.1 mmol/L Final   Chloride 09/13/2022 105  98 - 111 mmol/L Final   CO2 09/13/2022 21 (L)  22 - 32 mmol/L Final   Glucose, Bld 09/13/2022 111 (H)  70 - 99 mg/dL Final   Glucose reference range applies only to samples taken after fasting for at least 8 hours.   BUN 09/13/2022 14  6 - 20 mg/dL Final   Creatinine, Ser 09/13/2022 0.92  0.44 - 1.00 mg/dL Final   Calcium 09/13/2022 9.1  8.9 - 10.3 mg/dL Final   GFR, Estimated 09/13/2022 >60  >60 mL/min Final   Comment: (NOTE) Calculated using the CKD-EPI Creatinine Equation (2021)    Anion gap 09/13/2022 10  5 - 15 Final   Performed at [facility], [place] [address] [place], [place] [address]   Vitamin B-12 09/13/2022 585  180 - 914 pg/mL Final   Comment: (NOTE) This assay is not validated for testing neonatal or myeloproliferative syndrome specimens for Vitamin B12 levels. Performed at [facility], [place] [address] [place] [address], [place], [place] [address]    Color, Urine 09/14/2022 YELLOW  YELLOW Final   APPearance 09/14/2022 HAZY (A)  CLEAR Final   Specific Gravity, Urine 09/14/2022 1.025  1.005 - 1.030 Final   pH 09/14/2022 5.0  5.0 - 8.0 Final   Glucose, UA 09/14/2022 NEGATIVE  NEGATIVE mg/dL Final   Hgb urine dipstick 09/14/2022 NEGATIVE  NEGATIVE Final  Bilirubin Urine 09/14/2022 NEGATIVE   NEGATIVE Final   Ketones, ur 09/14/2022 NEGATIVE  NEGATIVE mg/dL Final   Protein, ur 09/14/2022 NEGATIVE  NEGATIVE mg/dL Final   Nitrite 09/14/2022 NEGATIVE  NEGATIVE Final   Leukocytes,Ua 09/14/2022 TRACE (A)  NEGATIVE Final   RBC / HPF 09/14/2022 0-5  0 - 5 RBC/hpf Final   WBC, UA 09/14/2022 0-5  0 - 5 WBC/hpf Final   Bacteria, UA 09/14/2022 RARE (A)  NONE SEEN Final   Squamous Epithelial / HPF 09/14/2022 0-5  0 - 5 Final   Mucus 09/14/2022 PRESENT   Final   Performed at [facility], [place] [address], [place], [place] [address]   Glucose-Capillary 09/16/2022 105 (H)  70 - 99 mg/dL Final   Glucose reference range applies only to samples taken after fasting for at least 8 hours.   SARS Coronavirus 2 by RT PCR 09/30/2022 NEGATIVE  NEGATIVE Final   Comment: (NOTE) SARS-CoV-2 target nucleic acids are NOT DETECTED.  The SARS-CoV-2 RNA is generally detectable in upper respiratory specimens during the acute phase of infection. The lowest concentration of SARS-CoV-2 viral copies this assay can detect is 138 copies/mL. A negative result does not preclude SARS-Cov-2 infection and should not be used as the sole basis for treatment or other patient management decisions. A negative result may occur with  improper specimen collection/handling, submission of specimen other than nasopharyngeal swab, presence of viral mutation(s) within the areas targeted by this assay, and inadequate number of viral copies(<138 copies/mL). A negative result must be combined with clinical observations, patient history, and epidemiological information. The expected result is Negative.  Fact Sheet for Patients:  EntrepreneurPulse.com.au  Fact Sheet for Healthcare Providers:  IncredibleEmployment.be  This test is no                          t yet approved or cleared by the [place] FDA and  has been authorized for detection and/or diagnosis of SARS-CoV-2 by FDA under  an Emergency Use Authorization (EUA). This EUA will remain  in effect (meaning this test can be used) for the duration of the COVID-19 declaration under Section 564(b)(1) of the Act, 21 U.S.C.section 360bbb-3(b)(1), unless the authorization is terminated  or revoked sooner.       Influenza A by PCR 09/30/2022 NEGATIVE  NEGATIVE Final   Influenza B by PCR 09/30/2022 NEGATIVE  NEGATIVE Final   Comment: (NOTE) The Xpert Xpress SARS-CoV-2/FLU/RSV plus assay is intended as an aid in the diagnosis of influenza from Nasopharyngeal swab specimens and should not be used as a sole basis for treatment. Nasal washings and aspirates are unacceptable for Xpert Xpress SARS-CoV-2/FLU/RSV testing.  Fact Sheet for Patients: EntrepreneurPulse.com.au  Fact Sheet for Healthcare Providers: IncredibleEmployment.be  This test is not yet approved or cleared by the [place] FDA and has been authorized for detection and/or diagnosis of SARS-CoV-2 by FDA under an Emergency Use Authorization (EUA). This EUA will remain in effect (meaning this test can be used) for the duration of the COVID-19 declaration under Section 564(b)(1) of the Act, 21 U.S.C. section 360bbb-3(b)(1), unless the authorization is terminated or revoked.     Resp Syncytial Virus by PCR 09/30/2022 NEGATIVE  NEGATIVE Final   Comment: (NOTE) Fact Sheet for Patients: EntrepreneurPulse.com.au  Fact Sheet for Healthcare Providers: IncredibleEmployment.be  This test is not yet approved or cleared by the [place] FDA and has been authorized for detection and/or diagnosis of SARS-CoV-2 by  FDA under an Emergency Use Authorization (EUA). This EUA will remain in effect (meaning this test can be used) for the duration of the COVID-19 declaration under Section 564(b)(1) of the Act, 21 U.S.C. section 360bbb-3(b)(1), unless the authorization is terminated  or revoked.  Performed at [facility], [place] [address] [place], [place] [address]    Sodium 10/01/2022 136  135 - 145 mmol/L Final   Potassium 10/01/2022 3.8  3.5 - 5.1 mmol/L Final   Chloride 10/01/2022 104  98 - 111 mmol/L Final   CO2 10/01/2022 27  22 - 32 mmol/L Final   Glucose, Bld 10/01/2022 98  70 - 99 mg/dL Final   Glucose reference range applies only to samples taken after fasting for at least 8 hours.   BUN 10/01/2022 12  6 - 20 mg/dL Final   Creatinine, Ser 10/01/2022 0.98  0.44 - 1.00 mg/dL Final   Calcium 10/01/2022 8.9  8.9 - 10.3 mg/dL Final   GFR, Estimated 10/01/2022 >60  >60 mL/min Final   Comment: (NOTE) Calculated using the CKD-EPI Creatinine Equation (2021)    Anion gap 10/01/2022 5  5 - 15 Final   Performed at [facility], [place] [address], [place], [place] [address]   WBC 10/01/2022 5.1  4.0 - 10.5 K/uL Final   RBC 10/01/2022 4.31  3.87 - 5.11 MIL/uL Final   Hemoglobin 10/01/2022 12.7  12.0 - 15.0 g/dL Final   HCT 10/01/2022 38.8  36.0 - 46.0 % Final   MCV 10/01/2022 90.0  80.0 - 100.0 fL Final   MCH 10/01/2022 29.5  26.0 - 34.0 pg Final   MCHC 10/01/2022 32.7  30.0 - 36.0 g/dL Final   RDW 10/01/2022 12.4  11.5 - 15.5 % Final   Platelets 10/01/2022 300  150 - 400 K/uL Final   nRBC 10/01/2022 0.0  0.0 - 0.2 % Final   Performed at [place] [address] [place], [place] [address]   Lipase 10/01/2022 29  11 - 51 U/L Final   Performed at [place] [address] [place], [place] [address]   Color, Urine 10/05/2022 YELLOW  YELLOW Final   APPearance 10/05/2022 HAZY (A)  CLEAR Final   Specific Gravity, Urine 10/05/2022 1.018  1.005 - 1.030 Final   pH 10/05/2022 5.0  5.0 - 8.0 Final   Glucose, UA 10/05/2022 NEGATIVE  NEGATIVE mg/dL Final   Hgb urine dipstick 10/05/2022 NEGATIVE  NEGATIVE Final   Bilirubin Urine 10/05/2022 NEGATIVE  NEGATIVE Final   Ketones, ur 10/05/2022 NEGATIVE  NEGATIVE mg/dL Final   Protein, ur  10/05/2022 NEGATIVE  NEGATIVE mg/dL Final   Nitrite 10/05/2022 NEGATIVE  NEGATIVE Final   Leukocytes,Ua 10/05/2022 TRACE (A)  NEGATIVE Final   RBC / HPF 10/05/2022 0-5  0 - 5 RBC/hpf Final   WBC, UA 10/05/2022 0-5  0 - 5 WBC/hpf Final   Bacteria, UA 10/05/2022 RARE (A)  NONE SEEN Final   Squamous Epithelial / HPF 10/05/2022 0-5  0 - 5 Final   Mucus 10/05/2022 PRESENT   Final   Performed at [facility], [place] [address], [place], [place] [address]   Specimen Description 10/05/2022 URINE, CLEAN CATCH   Final   Special Requests 10/05/2022    Final                   Value:NONE Performed at [facility], [place] [address], [place], [place] [address]    Culture  10/05/2022 10,000 COLONIES/mL MULTIPLE SPECIES PRESENT, SUGGEST RECOLLECTION (A)   Final   Report Status 10/05/2022 10/07/2022 FINAL   Final  Admission on 07/04/2022, Discharged on 07/05/2022  Component Date Value Ref Range Status   Sodium 07/04/2022 142  135 - 145 mmol/L Final   Potassium 07/04/2022 4.4  3.5 - 5.1 mmol/L Final   Chloride 07/04/2022 109  98 - 111 mmol/L Final   CO2 07/04/2022 26  22 - 32 mmol/L Final   Glucose, Bld 07/04/2022 96  70 - 99 mg/dL Final   Glucose reference range applies only to samples taken after fasting for at least 8 hours.   BUN 07/04/2022 15  6 - 20 mg/dL Final   Creatinine, Ser 07/04/2022 0.83  0.44 - 1.00 mg/dL Final   Calcium 07/04/2022 9.3  8.9 - 10.3 mg/dL Final   Total Protein 07/04/2022 7.2  6.5 - 8.1 g/dL Final   Albumin 07/04/2022 3.9  3.5 - 5.0 g/dL Final   AST 07/04/2022 23  15 - 41 U/L Final   ALT 07/04/2022 28  0 - 44 U/L Final   Alkaline Phosphatase 07/04/2022 77  38 - 126 U/L Final   Total Bilirubin 07/04/2022 0.4  0.3 - 1.2 mg/dL Final   GFR, Estimated 07/04/2022 >60  >60 mL/min Final   Comment: (NOTE) Calculated using the CKD-EPI Creatinine Equation (2021)    Anion gap 07/04/2022 7  5 - 15 Final   Performed at [facility], [place] [address],  [place], [place] [address]   Alcohol, Ethyl (B) 07/04/2022 <10  <10 mg/dL Final   Comment: (NOTE) Lowest detectable limit for serum alcohol is 10 mg/dL.  For medical purposes only. Performed at [facility], [place] [address], [place], [place] [address]    WBC 07/04/2022 7.0  4.0 - 10.5 K/uL Final   RBC 07/04/2022 4.18  3.87 - 5.11 MIL/uL Final   Hemoglobin 07/04/2022 12.8  12.0 - 15.0 g/dL Final   HCT 07/04/2022 39.1  36.0 - 46.0 % Final   MCV 07/04/2022 93.5  80.0 - 100.0 fL Final   MCH 07/04/2022 30.6  26.0 - 34.0 pg Final   MCHC 07/04/2022 32.7  30.0 - 36.0 g/dL Final   RDW 07/04/2022 12.9  11.5 - 15.5 % Final   Platelets 07/04/2022 243  150 - 400 K/uL Final   nRBC 07/04/2022 0.0  0.0 - 0.2 % Final   Neutrophils Relative % 07/04/2022 43  % Final   Neutro Abs 07/04/2022 3.0  1.7 - 7.7 K/uL Final   Lymphocytes Relative 07/04/2022 50  % Final   Lymphs Abs 07/04/2022 3.5  0.7 - 4.0 K/uL Final   Monocytes Relative 07/04/2022 7  % Final   Monocytes Absolute 07/04/2022 0.5  0.1 - 1.0 K/uL Final   Eosinophils Relative 07/04/2022 0  % Final   Eosinophils Absolute 07/04/2022 0.0  0.0 - 0.5 K/uL Final   Basophils Relative 07/04/2022 0  % Final   Basophils Absolute 07/04/2022 0.0  0.0 - 0.1 K/uL Final   Immature Granulocytes 07/04/2022 0  % Final   Abs Immature Granulocytes 07/04/2022 0.01  0.00 - 0.07 K/uL Final   Performed at [facility], [place] [address], [place], [place] [address]   I-stat hCG, quantitative 07/04/2022 <5.0  <5 mIU/mL Final   Comment 3 07/04/2022          Final   Comment:   GEST. AGE      CONC.  (mIU/mL)   <=  1 WEEK        5 - 50     2 WEEKS       50 - 500     3 WEEKS       100 - 10,000     4 WEEKS     1,000 - 30,000        FEMALE AND NON-PREGNANT FEMALE:     LESS THAN 5 mIU/mL   Admission on 06/14/2022, Discharged on 06/14/2022  Component Date Value Ref Range Status   Color, UA 06/14/2022 yellow  yellow Final   Clarity, UA 06/14/2022  cloudy (A)  clear Final   Glucose, UA 06/14/2022 negative  negative mg/dL Final   Bilirubin, UA 06/14/2022 negative  negative Final   Ketones, POC UA 06/14/2022 negative  negative mg/dL Final   Spec Grav, UA 06/14/2022 1.025  1.010 - 1.025 Final   Blood, UA 06/14/2022 negative  negative Final   pH, UA 06/14/2022 7.5  5.0 - 8.0 Final   Protein Ur, POC 06/14/2022 =30 (A)  negative mg/dL Final   Urobilinogen, UA 06/14/2022 0.2  0.2 or 1.0 E.U./dL Final   Nitrite, UA 06/14/2022 Negative  Negative Final   Leukocytes, UA 06/14/2022 Negative  Negative Final   Preg Test, Ur 06/14/2022 Negative  Negative Final   Specimen Description 06/14/2022 URINE, CLEAN CATCH   Final   Special Requests 06/14/2022    Final                   Value:NONE Performed at [place] [address] [place], [place] [address]    Culture 06/14/2022 MULTIPLE SPECIES PRESENT, SUGGEST RECOLLECTION (A)   Final   Report Status 06/14/2022 06/16/2022 FINAL   Final  Admission on 06/07/2022, Discharged on 06/10/2022  Component Date Value Ref Range Status   SARS Coronavirus 2 by RT PCR 06/07/2022 NEGATIVE  NEGATIVE Final   Comment: (NOTE) SARS-CoV-2 target nucleic acids are NOT DETECTED.  The SARS-CoV-2 RNA is generally detectable in upper respiratory specimens during the acute phase of infection. The lowest concentration of SARS-CoV-2 viral copies this assay can detect is 138 copies/mL. A negative result does not preclude SARS-Cov-2 infection and should not be used as the sole basis for treatment or other patient management decisions. A negative result may occur with  improper specimen collection/handling, submission of specimen other than nasopharyngeal swab, presence of viral mutation(s) within the areas targeted by this assay, and inadequate number of viral copies(<138 copies/mL). A negative result must be combined with clinical observations, patient history, and epidemiological information. The expected result  is Negative.  Fact Sheet for Patients:  EntrepreneurPulse.com.au  Fact Sheet for Healthcare Providers:  IncredibleEmployment.be  This test is no                          t yet approved or cleared by the [place] FDA and  has been authorized for detection and/or diagnosis of SARS-CoV-2 by FDA under an Emergency Use Authorization (EUA). This EUA will remain  in effect (meaning this test can be used) for the duration of the COVID-19 declaration under Section 564(b)(1) of the Act, 21 U.S.C.section 360bbb-3(b)(1), unless the authorization is terminated  or revoked sooner.       Influenza A by PCR 06/07/2022 NEGATIVE  NEGATIVE Final   Influenza B by PCR 06/07/2022 NEGATIVE  NEGATIVE Final   Comment: (NOTE) The Xpert Xpress SARS-CoV-2/FLU/RSV plus assay is intended as an aid  in the diagnosis of influenza from Nasopharyngeal swab specimens and should not be used as a sole basis for treatment. Nasal washings and aspirates are unacceptable for Xpert Xpress SARS-CoV-2/FLU/RSV testing.  Fact Sheet for Patients: EntrepreneurPulse.com.au  Fact Sheet for Healthcare Providers: IncredibleEmployment.be  This test is not yet approved or cleared by the [place] FDA and has been authorized for detection and/or diagnosis of SARS-CoV-2 by FDA under an Emergency Use Authorization (EUA). This EUA will remain in effect (meaning this test can be used) for the duration of the COVID-19 declaration under Section 564(b)(1) of the Act, 21 U.S.C. section 360bbb-3(b)(1), unless the authorization is terminated or revoked.  Performed at [facility], [place] [address] [place], [place] [address]    WBC 06/07/2022 5.7  4.0 - 10.5 K/uL Final   RBC 06/07/2022 4.39  3.87 - 5.11 MIL/uL Final   Hemoglobin 06/07/2022 13.2  12.0 - 15.0 g/dL Final   HCT 06/07/2022 40.4  36.0 - 46.0 % Final   MCV 06/07/2022 92.0  80.0 - 100.0 fL  Final   MCH 06/07/2022 30.1  26.0 - 34.0 pg Final   MCHC 06/07/2022 32.7  30.0 - 36.0 g/dL Final   RDW 06/07/2022 12.4  11.5 - 15.5 % Final   Platelets 06/07/2022 308  150 - 400 K/uL Final   nRBC 06/07/2022 0.0  0.0 - 0.2 % Final   Neutrophils Relative % 06/07/2022 42  % Final   Neutro Abs 06/07/2022 2.4  1.7 - 7.7 K/uL Final   Lymphocytes Relative 06/07/2022 54  % Final   Lymphs Abs 06/07/2022 3.1  0.7 - 4.0 K/uL Final   Monocytes Relative 06/07/2022 4  % Final   Monocytes Absolute 06/07/2022 0.2  0.1 - 1.0 K/uL Final   Eosinophils Relative 06/07/2022 0  % Final   Eosinophils Absolute 06/07/2022 0.0  0.0 - 0.5 K/uL Final   Basophils Relative 06/07/2022 0  % Final   Basophils Absolute 06/07/2022 0.0  0.0 - 0.1 K/uL Final   Immature Granulocytes 06/07/2022 0  % Final   Abs Immature Granulocytes 06/07/2022 0.01  0.00 - 0.07 K/uL Final   Performed at [facility], [place] [address], [place], [place] [address]   Sodium 06/07/2022 139  135 - 145 mmol/L Final   Potassium 06/07/2022 4.0  3.5 - 5.1 mmol/L Final   Chloride 06/07/2022 104  98 - 111 mmol/L Final   CO2 06/07/2022 28  22 - 32 mmol/L Final   Glucose, Bld 06/07/2022 104 (H)  70 - 99 mg/dL Final   Glucose reference range applies only to samples taken after fasting for at least 8 hours.   BUN 06/07/2022 8  6 - 20 mg/dL Final   Creatinine, Ser 06/07/2022 0.84  0.44 - 1.00 mg/dL Final   Calcium 06/07/2022 9.1  8.9 - 10.3 mg/dL Final   Total Protein 06/07/2022 6.9  6.5 - 8.1 g/dL Final   Albumin 06/07/2022 3.7  3.5 - 5.0 g/dL Final   AST 06/07/2022 19  15 - 41 U/L Final   ALT 06/07/2022 22  0 - 44 U/L Final   Alkaline Phosphatase 06/07/2022 54  38 - 126 U/L Final   Total Bilirubin 06/07/2022 0.4  0.3 - 1.2 mg/dL Final   GFR, Estimated 06/07/2022 >60  >60 mL/min Final   Comment: (NOTE) Calculated using the CKD-EPI Creatinine Equation (2021)    Anion gap 06/07/2022 7  5 - 15 Final   Performed at [facility], [address] [place], [place] [address]   Hgb A1c MFr Bld 06/07/2022 5.0  4.8 - 5.6 % Final   Comment: (NOTE) Pre diabetes:          5.7%-6.4%  Diabetes:              >6.4%  Glycemic control for   <7.0% adults with diabetes    Mean Plasma Glucose 06/07/2022 96.8  mg/dL Final   Performed at [facility], [place] [address] [place], [place] [address]   TSH 06/07/2022 1.620  0.350 - 4.500 uIU/mL Final   Comment: Performed by a 3rd Generation assay with a functional sensitivity of <=0.01 uIU/mL. Performed at [facility], [place] [address] [place], [place] [address]    RPR Ser Ql 06/07/2022 NON REACTIVE  NON REACTIVE Final   Performed at [facility], [place] [address], [place], [place] [address]   Color, Urine 06/07/2022 YELLOW  YELLOW Final   APPearance 06/07/2022 HAZY (A)  CLEAR Final   Specific Gravity, Urine 06/07/2022 1.018  1.005 - 1.030 Final   pH 06/07/2022 7.0  5.0 - 8.0 Final   Glucose, UA 06/07/2022 NEGATIVE  NEGATIVE mg/dL Final   Hgb urine dipstick 06/07/2022 NEGATIVE  NEGATIVE Final   Bilirubin Urine 06/07/2022 NEGATIVE  NEGATIVE Final   Ketones, ur 06/07/2022 NEGATIVE  NEGATIVE mg/dL Final   Protein, ur 06/07/2022 NEGATIVE  NEGATIVE mg/dL Final   Nitrite 06/07/2022 NEGATIVE  NEGATIVE Final   Leukocytes,Ua 06/07/2022 NEGATIVE  NEGATIVE Final   Performed at [facility], [place] [address], [place], [place] [address]   Cholesterol 06/07/2022 164  0 - 200 mg/dL Final   Triglycerides 06/07/2022 158 (H)  <150 mg/dL Final   HDL 06/07/2022 44  >40 mg/dL Final   Total CHOL/HDL Ratio 06/07/2022 3.7  RATIO Final   VLDL 06/07/2022 32  0 - 40 mg/dL Final   LDL Cholesterol 06/07/2022 88  0 - 99 mg/dL Final   Comment:        Total Cholesterol/HDL:CHD Risk Coronary Heart Disease Risk Table                     Men   Women  1/2 Average Risk   3.4   3.3  Average Risk       5.0   4.4  2 X Average Risk   9.6   7.1  3 X Average Risk  23.4   11.0        Use the calculated Patient  Ratio above and the CHD Risk Table to determine the patient's CHD Risk.        ATP III CLASSIFICATION (LDL):  <100     mg/dL   Optimal  100-129  mg/dL   Near or Above                    Optimal  130-159  mg/dL   Borderline  160-189  mg/dL   High  >190     mg/dL   Very High Performed at [place] [address] [place], [place] [address]    HIV Screen 4th Generation wRfx 06/07/2022 Non Reactive  Non Reactive Final   Performed at [facility], [place] [address], [place], [place] [address]   SARSCOV2ONAVIRUS 2 AG 06/07/2022 NEGATIVE  NEGATIVE Final   Comment: (NOTE) SARS-CoV-2 antigen NOT DETECTED.   Negative results are presumptive.  Negative results do not preclude SARS-CoV-2  infection and should not be used as the sole basis for treatment or other patient management decisions, including infection  control decisions, particularly in the presence of clinical signs and  symptoms consistent with COVID-19, or in those who have been in contact with the virus.  Negative results must be combined with clinical observations, patient history, and epidemiological information. The expected result is Negative.  Fact Sheet for Patients: https://www.jennings-kim.com/  Fact Sheet for Healthcare Providers: https://alexander-rogers.biz/  This test is not yet approved or cleared by the [place] FDA and  has been authorized for detection and/or diagnosis of SARS-CoV-2 by FDA under an Emergency Use Authorization (EUA).  This EUA will remain in effect (meaning this test can be used) for the duration of  the COV                          ID-19 declaration under Section 564(b)(1) of the Act, 21 U.S.C. section 360bbb-3(b)(1), unless the authorization is terminated or revoked sooner.     POC Amphetamine UR 06/07/2022 None Detected  NONE DETECTED (Cut Off Level 1000 ng/mL) Final   POC Secobarbital (BAR) 06/07/2022 None Detected  NONE DETECTED (Cut Off Level  300 ng/mL) Final   POC Buprenorphine (BUP) 06/07/2022 None Detected  NONE DETECTED (Cut Off Level 10 ng/mL) Final   POC Oxazepam (BZO) 06/07/2022 None Detected  NONE DETECTED (Cut Off Level 300 ng/mL) Final   POC Cocaine UR 06/07/2022 None Detected  NONE DETECTED (Cut Off Level 300 ng/mL) Final   POC Methamphetamine UR 06/07/2022 None Detected  NONE DETECTED (Cut Off Level 1000 ng/mL) Final   POC Morphine 06/07/2022 None Detected  NONE DETECTED (Cut Off Level 300 ng/mL) Final   POC Methadone UR 06/07/2022 None Detected  NONE DETECTED (Cut Off Level 300 ng/mL) Final   POC Oxycodone UR 06/07/2022 None Detected  NONE DETECTED (Cut Off Level 100 ng/mL) Final   POC Marijuana UR 06/07/2022 None Detected  NONE DETECTED (Cut Off Level 50 ng/mL) Final   Preg Test, Ur 06/08/2022 NEGATIVE  NEGATIVE Final   Comment:        THE SENSITIVITY OF THIS METHODOLOGY IS >20 mIU/mL. Performed at [facility], [address] [place], [place] [address]    Preg Test, Ur 06/08/2022 NEGATIVE  NEGATIVE Final   Comment:        THE SENSITIVITY OF THIS METHODOLOGY IS >24 mIU/mL     Blood Alcohol level:  Lab Results  Component Value Date   ETH <10 07/04/2022   ETH <10 02/07/2022    Metabolic Disorder Labs: Lab Results  Component Value Date   HGBA1C 5.7 (H) 10/15/2022   MPG 117 10/15/2022   MPG 96.8 06/07/2022   No results found for: "PROLACTIN" Lab Results  Component Value Date   CHOL 181 07/25/2022   TRIG 118 07/25/2022   HDL 45 07/25/2022   CHOLHDL 4.0 07/25/2022   VLDL 24 07/25/2022   LDLCALC 112 (H) 07/25/2022   LDLCALC 88 06/07/2022    Therapeutic Lab Levels: No results found for: "LITHIUM" No results found for: "VALPROATE" No results found for: "CBMZ"  Physical Findings   GAD-7    Flowsheet Row Office Visit from 12/17/2021 in [facility] for [facility] at [place]  Total GAD-7 Score 17      PHQ2-9    Flowsheet Row ED from 10/07/2022 in [facility] ED from 06/07/2022 in [facility] Office  Visit from 12/17/2021 in [facility] for [place] at [facility] Total Score 0 1 2  PHQ-9 Total Score -- 2 [place] ED from 10/07/2022 in [facility] Most recent reading at 11/07/2022  9:32 AM ED from 10/07/2022 in [facility] Emergency Department at [facility] Most recent reading at 10/07/2022 12:18 PM ED from 07/25/2022 in [facility] Most recent reading at 10/06/2022  9:38 AM  C-SSRS RISK CATEGORY No Risk No Risk No Risk        Musculoskeletal  Strength & Muscle Tone: within normal limits Gait & Station: normal Patient leans: N/A  Psychiatric Specialty Exam  Presentation  General Appearance:  Appropriate for Environment; Casual; Fairly Groomed  Eye Contact: Fair  Speech: Clear and Coherent; Normal Rate  Speech Volume: Normal  Handedness: Right   Mood and Affect  Mood: -- ("tired")  Affect: Blunt   Thought Process  Thought Processes: Coherent; Goal Directed  Descriptions of Associations:Intact  Orientation:Full (Time, Place and Person)  Thought Content:Logical  Diagnosis of Schizophrenia or Schizoaffective disorder in past: Yes  Duration of Psychotic Symptoms: Greater than six months   Hallucinations:Hallucinations: None  Ideas of Reference:None  Suicidal Thoughts:Suicidal Thoughts: No  Homicidal Thoughts:Homicidal Thoughts: No   Sensorium  Memory: Immediate Good  Judgment: Fair  Insight: Fair   Community education officer  Concentration: Good  Attention Span: Good  Recall: Good  Fund of Knowledge: Good  Language: Good   Psychomotor Activity  Psychomotor Activity: Psychomotor Activity: Normal   Assets  Assets: Communication Skills; Desire for Improvement; Financial Resources/Insurance; Physical Health; Resilience   Sleep   Sleep: Sleep: Good   No data recorded  Physical Exam  Physical Exam Constitutional:      General: She is not in acute distress.    Appearance: She is not ill-appearing, toxic-appearing or diaphoretic.  Eyes:     General: No scleral icterus. Cardiovascular:     Rate and Rhythm: Normal rate.  Pulmonary:     Effort: Pulmonary effort is normal. No respiratory distress.  Neurological:     Mental Status: She is alert and oriented to person, place, and time.  Psychiatric:        Attention and Perception: Attention and perception normal.        Mood and Affect: Affect is blunt.        Speech: Speech normal.        Behavior: Behavior normal. Behavior is cooperative.        Thought Content: Thought content normal.    Review of Systems  Constitutional:  Negative for chills and fever.  Respiratory:  Negative for shortness of breath.   Cardiovascular:  Negative for chest pain and palpitations.  Gastrointestinal:  Negative for abdominal pain.  Neurological:  Negative for headaches.   Blood pressure 114/76, pulse 98, temperature 97.6 F (36.4 C), temperature source Oral, resp. rate 20, height 5\' 1"  (1.549 m), weight 198 lb (89.8 kg), SpO2 98 %. Body mass index is 37.41 kg/m.  Treatment Plan Summary: Plan Pt remains psychiatrically cleared. Disposition is pending placement.  Tharon Aquas, NP 11/09/2022 9:47 AM

## 2022-11-09 NOTE — ED Notes (Signed)
Pt sleeping in recliner bed. Resp even and unlabored. Will monitor for safety 

## 2022-11-09 NOTE — ED Notes (Signed)
Pt sitting on bed calm and cooperative watching television. Pt has no c/o pain or distress. Will continue to monitor for safety

## 2022-11-10 DIAGNOSIS — N9489 Other specified conditions associated with female genital organs and menstrual cycle: Secondary | ICD-10-CM | POA: Diagnosis not present

## 2022-11-10 DIAGNOSIS — F333 Major depressive disorder, recurrent, severe with psychotic symptoms: Secondary | ICD-10-CM | POA: Diagnosis not present

## 2022-11-10 DIAGNOSIS — E118 Type 2 diabetes mellitus with unspecified complications: Secondary | ICD-10-CM | POA: Diagnosis not present

## 2022-11-10 DIAGNOSIS — F419 Anxiety disorder, unspecified: Secondary | ICD-10-CM | POA: Diagnosis not present

## 2022-11-10 DIAGNOSIS — F17200 Nicotine dependence, unspecified, uncomplicated: Secondary | ICD-10-CM | POA: Diagnosis not present

## 2022-11-10 DIAGNOSIS — E039 Hypothyroidism, unspecified: Secondary | ICD-10-CM | POA: Diagnosis not present

## 2022-11-10 DIAGNOSIS — N390 Urinary tract infection, site not specified: Secondary | ICD-10-CM | POA: Diagnosis not present

## 2022-11-10 DIAGNOSIS — F209 Schizophrenia, unspecified: Secondary | ICD-10-CM | POA: Diagnosis not present

## 2022-11-10 DIAGNOSIS — Z1152 Encounter for screening for COVID-19: Secondary | ICD-10-CM | POA: Diagnosis not present

## 2022-11-10 MED ORDER — FLUTICASONE PROPIONATE 50 MCG/ACT NA SUSP
1.0000 | Freq: Every day | NASAL | Status: DC
  Administered 2022-11-11 – 2022-11-22 (×12): 1 via NASAL
  Filled 2022-11-10: qty 16

## 2022-11-10 NOTE — ED Notes (Signed)
Pt watching tv  No distress noted.

## 2022-11-10 NOTE — ED Notes (Signed)
Pt is currently sleeping, no distress noted, environmental check complete, will continue to monitor patient for safety. ? ?

## 2022-11-10 NOTE — ED Notes (Signed)
Received patient this PM. Patient sitting in her bed watching television. Patient respirations are even and unlabored. Will continue to monitor for safety.

## 2022-11-10 NOTE — ED Notes (Signed)
Pt sleeping at present, no distress noted.  Monitoring for safety. 

## 2022-11-10 NOTE — ED Notes (Signed)
Patient sitting on the unit quietly, no distress noted, will continue to monitor for patient safety.

## 2022-11-10 NOTE — ED Notes (Signed)
Pt sleeping@this time. Breathing even and unlabored. Will continue to monitor for safety 

## 2022-11-10 NOTE — ED Notes (Signed)
Patient is sitting quietly in bed drawing, no distress noted.

## 2022-11-10 NOTE — ED Provider Notes (Signed)
Behavioral Health Progress Note  Date and Time: 11/10/2022 10:01 AM Name: Nicole MassonLatasha Flinchum MRN:  [id number]  Nicole MassonLatasha Portilla is a 32 y/o female with PMH of SCZ unspecified, MDD with psychosis, who [facility] via [facility] after a verbal altercation with Jeannett SeniorJosephine Okeke (not patient's legal guardian) for transient SI. This is patient's fourth visit [facility] for similar concerns this year. Patient has been dismissed from previous group home due to threatening SI and HI, then leaving the group home. 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  Subjective:    Pt seen on unit. She is alert and oriented x3, sitting up, eating breakfast. She endorses "good", euthymic mood. She denies suicidal, homicidal or violent ideations. She denies auditory visual hallucinations or paranoia. She reports it has been difficult staying on the unit without things to distract herself. She states she is looking forward to placement. She wants to have therapy once she is discharged, have somebody to talk to. Support, encouragement provided. Pt has been calm, cooperative, pleasant. There is no evidence of agitation, aggression or distractibility.  Diagnosis:  Final diagnoses:  Tobacco use disorder  Schizophrenia, unspecified type (HCC)  MDD (major depressive disorder), recurrent episode, moderate (HCC)  Hypothyroidism, unspecified type    Total Time spent with patient: 20 minutes  Past Psychiatric History: SCZ unspecified, MDD with psycohsis, anxiety, depression, tobacco use Past Medical History: Hypothyroidism Family History: Hypertension - father diabetes Family Psychiatric  History: None reported Social History: Currently seeking placement  Additional Social History:                         Sleep: Good  Appetite:  Good  Current Medications:  Current Facility-Administered Medications  Medication Dose Route Frequency Provider Last Rate Last Admin    acetaminophen (TYLENOL) tablet 650 mg  650 mg Oral Q6H PRN Marlou SaByungura, Veronique M, NP   650 mg at 11/04/22 2154   alum & mag hydroxide-simeth (MAALOX/MYLANTA) 200-200-20 MG/5ML suspension 30 mL  30 mL Oral Q4H PRN Marlou SaByungura, Veronique M, NP   30 mL at 10/26/22 1755   ARIPiprazole ER (ABILIFY MAINTENA) 400 MG prefilled syringe 400 mg  400 mg Intramuscular Q28 days Princess BruinsNguyen, Julie, DO   400 mg at 10/25/22 0920   cetaphil lotion   Topical PRN Princess BruinsNguyen, Julie, DO       fluticasone [facility](FLONASE) 50 MCG/ACT nasal spray 1 spray  1 spray Each Nare QHS Princess BruinsNguyen, Julie, DO   1 spray at 11/09/22 2109   hydrOXYzine (ATARAX) tablet 25 mg  25 mg Oral TID PRN Lorri Frederickarrion-Carrero, Margely, MD   25 mg at 11/09/22 1116   ketoconazole (NIZORAL) 2 % cream   Topical BID Princess BruinsNguyen, Julie, DO   Given at 11/09/22 2109   levothyroxine (SYNTHROID) tablet 100 mcg  100 mcg Oral Once Byungura, Veronique M, NP       levothyroxine (SYNTHROID) tablet 100 mcg  100 mcg Oral Q0600 Marlou SaByungura, Veronique M, NP   100 mcg at 11/10/22 0617   lidocaine (LIDODERM) 5 % 1 patch  1 patch Transdermal Q24H Princess BruinsNguyen, Julie, DO   1 patch at 11/09/22 0955   loratadine (CLARITIN) tablet 10 mg  10 mg Oral Daily Carrion-Carrero, Karle StarchMargely, MD   10 mg at 11/09/22 0957   magnesium hydroxide (MILK OF MAGNESIA) suspension 30 mL  30 mL Oral Daily PRN Marlou SaByungura, Veronique M, NP   30 mL at 10/12/22 2145   metFORMIN (GLUCOPHAGE) tablet 500 mg  500 mg Oral Q breakfast Carrion-Carrero, Margely, MD   500 mg at 11/09/22 0957   nicotine (NICODERM CQ - dosed in mg/24 hours) patch 14 mg  14 mg Transdermal Daily PRN Carrion-Carrero, Karle Starch, MD       ondansetron (ZOFRAN-ODT) disintegrating tablet 4 mg  4 mg Oral Q8H PRN Carrion-Carrero, Margely, MD   4 mg at 11/01/22 2246   Oxcarbazepine (TRILEPTAL) tablet 300 mg  300 mg Oral BID Byungura, Veronique M, NP   300 mg at 11/09/22 2109   pantoprazole (PROTONIX) EC tablet 40 mg  40 mg Oral Daily Carrion-Carrero, Margely, MD   40 mg at 11/09/22 0957    polyethylene glycol (MIRALAX / GLYCOLAX) packet 17 g  17 g Oral Daily Princess Bruins, DO   17 g at 11/09/22 0956   QUEtiapine (SEROQUEL) tablet 400 mg  400 mg Oral BID Rayburn Go, Veronique M, NP   400 mg at 11/09/22 2109   sertraline (ZOLOFT) tablet 150 mg  150 mg Oral Daily Carrion-Carrero, Margely, MD   150 mg at 11/09/22 0956   sodium chloride (OCEAN) 0.65 % nasal spray 1 spray  1 spray Each Nare Daily Princess Bruins, DO   1 spray at 11/09/22 0956   traZODone (DESYREL) tablet 100 mg  100 mg Oral QHS Olin Pia M, NP   100 mg at 11/09/22 2109   traZODone (DESYREL) tablet 50 mg  50 mg Oral QHS PRN Marlou Sa, NP   50 mg at 11/09/22 2109   valACYclovir (VALTREX) tablet 500 mg  500 mg Oral Daily Carrion-Carrero, Karle Starch, MD   500 mg at 11/09/22 0956   zinc oxide (BALMEX) 11.3 % cream   Topical PRN Princess Bruins, DO       Current Outpatient Medications  Medication Sig Dispense Refill   ABILIFY MAINTENA 400 MG PRSY prefilled syringe 400 mg every 28 (twenty-eight) days.     cetirizine (ZYRTEC) 10 MG tablet Take 10 mg by mouth daily.     cyclobenzaprine (FLEXERIL) 10 MG tablet Take 1 tablet (10 mg total) by mouth 2 (two) times daily as needed for muscle spasms. 20 tablet 0   fluticasone (FLONASE) 50 MCG/ACT nasal spray Place 1 spray into both nostrils daily.     meloxicam (MOBIC) 15 MG tablet Take 15 mg by mouth daily.     nitrofurantoin, macrocrystal-monohydrate, (MACROBID) 100 MG capsule Take 1 capsule (100 mg total) by mouth 2 (two) times daily. 10 capsule 0   ondansetron (ZOFRAN-ODT) 4 MG disintegrating tablet Take 1 tablet (4 mg total) by mouth every 8 (eight) hours as needed for nausea or vomiting. 20 tablet 0   Oxcarbazepine (TRILEPTAL) 300 MG tablet Take 1 tablet (300 mg total) by mouth 2 (two) times daily. 60 tablet 0   QUEtiapine (SEROQUEL) 400 MG tablet Take 1 tablet (400 mg total) by mouth 2 (two) times daily. 60 tablet 0   sertraline (ZOLOFT) 50 MG tablet Take 3  tablets (150 mg total) by mouth in the morning. 90 tablet 0   traZODone (DESYREL) 100 MG tablet Take 1 tablet (100 mg total) by mouth at bedtime. 30 tablet 0   valACYclovir (VALTREX) 500 MG tablet Take 500 mg by mouth daily.      Labs  Lab Results:  Admission on 10/07/2022  Component Date Value Ref Range Status   Hgb A1c MFr Bld 10/15/2022 5.7 (H)  4.8 - 5.6 % Final   Comment: (NOTE)         Prediabetes: 5.7 - 6.4  Diabetes: >6.4         Glycemic control for adults with diabetes: <7.0    Mean Plasma Glucose 10/15/2022 117  mg/dL Final   Comment: (NOTE) Performed At: [facility] [address] [place], [place] [id number] Jolene SchimkeNagendra Sanjai MD YQ:[phone]   Office Visit on 10/21/2022  Component Date Value Ref Range Status   SARSCOV2ONAVIRUS 2 AG 11/01/2022 NEGATIVE  NEGATIVE Final   Comment: (NOTE) SARS-CoV-2 antigen NOT DETECTED.   Negative results are presumptive.  Negative results do not preclude SARS-CoV-2 infection and should not be used as the sole basis for treatment or other patient management decisions, including infection  control decisions, particularly in the presence of clinical signs and  symptoms consistent with COVID-19, or in those who have been in contact with the virus.  Negative results must be combined with clinical observations, patient history, and epidemiological information. The expected result is Negative.  Fact Sheet for Patients: https://www.jennings-kim.com/https://www.fda.gov/media/141569/download  Fact Sheet for Healthcare Providers: https://alexander-rogers.biz/https://www.fda.gov/media/141568/download  This test is not yet approved or cleared by the [place] FDA and  has been authorized for detection and/or diagnosis of SARS-CoV-2 by FDA under an Emergency Use Authorization (EUA).  This EUA will remain in effect (meaning this test can be used) for the duration of  the COV                          ID-19 declaration under Section 564(b)(1) of the Act, 21 U.S.C. section  360bbb-3(b)(1), unless the authorization is terminated or revoked sooner.    Admission on 10/07/2022, Discharged on 10/07/2022  Component Date Value Ref Range Status   Color, Urine 10/07/2022 YELLOW  YELLOW Final   APPearance 10/07/2022 HAZY (A)  CLEAR Final   Specific Gravity, Urine 10/07/2022 1.011  1.005 - 1.030 Final   pH 10/07/2022 7.0  5.0 - 8.0 Final   Glucose, UA 10/07/2022 NEGATIVE  NEGATIVE mg/dL Final   Hgb urine dipstick 10/07/2022 SMALL (A)  NEGATIVE Final   Bilirubin Urine 10/07/2022 NEGATIVE  NEGATIVE Final   Ketones, ur 10/07/2022 NEGATIVE  NEGATIVE mg/dL Final   Protein, ur 52/84/132412/25/2023 NEGATIVE  NEGATIVE mg/dL Final   Nitrite 40/10/272512/25/2023 NEGATIVE  NEGATIVE Final   Leukocytes,Ua 10/07/2022 LARGE (A)  NEGATIVE Final   RBC / HPF 10/07/2022 0-5  0 - 5 RBC/hpf Final   WBC, UA 10/07/2022 0-5  0 - 5 WBC/hpf Final   Bacteria, UA 10/07/2022 FEW (A)  NONE SEEN Final   Squamous Epithelial / HPF 10/07/2022 11-20  0 - 5 Final   Performed at [facility], [address] [place], [place] [address]   I-stat hCG, quantitative 10/07/2022 <5.0  <5 mIU/mL Final   Comment 3 10/07/2022          Final   Comment:   GEST. AGE      CONC.  (mIU/mL)   <=1 WEEK        5 - 50     2 WEEKS       50 - 500     3 WEEKS       100 - 10,000     4 WEEKS     1,000 - 30,000        FEMALE AND NON-PREGNANT FEMALE:     LESS THAN 5 mIU/mL    Sodium 10/07/2022 138  135 - 145 mmol/L Final   Potassium 10/07/2022 4.3  3.5 - 5.1 mmol/L Final   Chloride 10/07/2022 104  98 - 111 mmol/L Final   CO2 10/07/2022 28  22 - 32 mmol/L Final   Glucose, Bld 10/07/2022 90  70 - 99 mg/dL Final   Glucose reference range applies only to samples taken after fasting for at least 8 hours.   BUN 10/07/2022 8  6 - 20 mg/dL Final   Creatinine, Ser 10/07/2022 0.96  0.44 - 1.00 mg/dL Final   Calcium 70/26/3785 9.1  8.9 - 10.3 mg/dL Final   GFR, Estimated 10/07/2022 >60  >60 mL/min Final   Comment: (NOTE) Calculated using the  CKD-EPI Creatinine Equation (2021)    Anion gap 10/07/2022 6  5 - 15 Final   Performed at [facility], [address] [place], [place] [address]   WBC 10/07/2022 5.8  4.0 - 10.5 K/uL Final   RBC 10/07/2022 4.36  3.87 - 5.11 MIL/uL Final   Hemoglobin 10/07/2022 12.9  12.0 - 15.0 g/dL Final   HCT 77/41/2878 40.0  36.0 - 46.0 % Final   MCV 10/07/2022 91.7  80.0 - 100.0 fL Final   MCH 10/07/2022 29.6  26.0 - 34.0 pg Final   MCHC 10/07/2022 32.3  30.0 - 36.0 g/dL Final   RDW 67/67/2094 12.4  11.5 - 15.5 % Final   Platelets 10/07/2022 295  150 - 400 K/uL Final   nRBC 10/07/2022 0.0  0.0 - 0.2 % Final   Performed at [facility], [address], [place], [place] [address]   SARS Coronavirus 2 by RT PCR 10/07/2022 NEGATIVE  NEGATIVE Final   Comment: (NOTE) SARS-CoV-2 target nucleic acids are NOT DETECTED.  The SARS-CoV-2 RNA is generally detectable in upper respiratory specimens during the acute phase of infection. The lowest concentration of SARS-CoV-2 viral copies this assay can detect is 138 copies/mL. A negative result does not preclude SARS-Cov-2 infection and should not be used as the sole basis for treatment or other patient management decisions. A negative result may occur with  improper specimen collection/handling, submission of specimen other than nasopharyngeal swab, presence of viral mutation(s) within the areas targeted by this assay, and inadequate number of viral copies(<138 copies/mL). A negative result must be combined with clinical observations, patient history, and epidemiological information. The expected result is Negative.  Fact Sheet for Patients:  BloggerCourse.com  Fact Sheet for Healthcare Providers:  SeriousBroker.it  This test is no                          t yet approved or cleared by the [place] FDA and  has been authorized for detection and/or diagnosis of SARS-CoV-2 by FDA under an  Emergency Use Authorization (EUA). This EUA will remain  in effect (meaning this test can be used) for the duration of the COVID-19 declaration under Section 564(b)(1) of the Act, 21 U.S.C.section 360bbb-3(b)(1), unless the authorization is terminated  or revoked sooner.       Influenza A by PCR 10/07/2022 NEGATIVE  NEGATIVE Final   Influenza B by PCR 10/07/2022 NEGATIVE  NEGATIVE Final   Comment: (NOTE) The Xpert Xpress SARS-CoV-2/FLU/RSV plus assay is intended as an aid in the diagnosis of influenza from Nasopharyngeal swab specimens and should not be used as a sole basis for treatment. Nasal washings and aspirates are unacceptable for Xpert Xpress SARS-CoV-2/FLU/RSV testing.  Fact Sheet for Patients: BloggerCourse.com  Fact Sheet for Healthcare Providers: SeriousBroker.it  This test is not yet approved or cleared by the [place] FDA and has  been authorized for detection and/or diagnosis of SARS-CoV-2 by FDA under an Emergency Use Authorization (EUA). This EUA will remain in effect (meaning this test can be used) for the duration of the COVID-19 declaration under Section 564(b)(1) of the Act, 21 U.S.C. section 360bbb-3(b)(1), unless the authorization is terminated or revoked.     Resp Syncytial Virus by PCR 10/07/2022 NEGATIVE  NEGATIVE Final   Comment: (NOTE) Fact Sheet for Patients: BloggerCourse.com  Fact Sheet for Healthcare Providers: SeriousBroker.it  This test is not yet approved or cleared by the [place] FDA and has been authorized for detection and/or diagnosis of SARS-CoV-2 by FDA under an Emergency Use Authorization (EUA). This EUA will remain in effect (meaning this test can be used) for the duration of the COVID-19 declaration under Section 564(b)(1) of the Act, 21 U.S.C. section 360bbb-3(b)(1), unless the authorization is terminated  or revoked.  Performed at [facility], [address], [place], [place] [address]    Specimen Description 10/07/2022 URINE, CLEAN CATCH   Final   Special Requests 10/07/2022    Final                   Value:NONE Performed at [facility], [address], [place], [place] [address]    Culture 10/07/2022 MULTIPLE SPECIES PRESENT, SUGGEST RECOLLECTION (A)   Final   Report Status 10/07/2022 10/08/2022 FINAL   Final  Admission on 07/25/2022, Discharged on 10/07/2022  Component Date Value Ref Range Status   SARS Coronavirus 2 by RT PCR 07/25/2022 NEGATIVE  NEGATIVE Final   Comment: (NOTE) SARS-CoV-2 target nucleic acids are NOT DETECTED.  The SARS-CoV-2 RNA is generally detectable in upper respiratory specimens during the acute phase of infection. The lowest concentration of SARS-CoV-2 viral copies this assay can detect is 138 copies/mL. A negative result does not preclude SARS-Cov-2 infection and should not be used as the sole basis for treatment or other patient management decisions. A negative result may occur with  improper specimen collection/handling, submission of specimen other than nasopharyngeal swab, presence of viral mutation(s) within the areas targeted by this assay, and inadequate number of viral copies(<138 copies/mL). A negative result must be combined with clinical observations, patient history, and epidemiological information. The expected result is Negative.  Fact Sheet for Patients:  BloggerCourse.com  Fact Sheet for Healthcare Providers:  SeriousBroker.it  This test is no                          t yet approved or cleared by the [place] FDA and  has been authorized for detection and/or diagnosis of SARS-CoV-2 by FDA under an Emergency Use Authorization (EUA). This EUA will remain  in effect (meaning this test can be used) for the duration of the COVID-19 declaration under Section 564(b)(1)  of the Act, 21 U.S.C.section 360bbb-3(b)(1), unless the authorization is terminated  or revoked sooner.       Influenza A by PCR 07/25/2022 NEGATIVE  NEGATIVE Final   Influenza B by PCR 07/25/2022 NEGATIVE  NEGATIVE Final   Comment: (NOTE) The Xpert Xpress SARS-CoV-2/FLU/RSV plus assay is intended as an aid in the diagnosis of influenza from Nasopharyngeal swab specimens and should not be used as a sole basis for treatment. Nasal washings and aspirates are unacceptable for Xpert Xpress SARS-CoV-2/FLU/RSV testing.  Fact Sheet for Patients: BloggerCourse.com  Fact Sheet for Healthcare Providers: SeriousBroker.it  This test is not yet approved or cleared by the [place]  FDA and has been authorized for detection and/or diagnosis of SARS-CoV-2 by FDA under an Emergency Use Authorization (EUA). This EUA will remain in effect (meaning this test can be used) for the duration of the COVID-19 declaration under Section 564(b)(1) of the Act, 21 U.S.C. section 360bbb-3(b)(1), unless the authorization is terminated or revoked.  Performed at [facility], [address] [place], [place] [address]    WBC 07/25/2022 8.3  4.0 - 10.5 K/uL Final   RBC 07/25/2022 4.44  3.87 - 5.11 MIL/uL Final   Hemoglobin 07/25/2022 13.7  12.0 - 15.0 g/dL Final   HCT 84/13/2440 40.2  36.0 - 46.0 % Final   MCV 07/25/2022 90.5  80.0 - 100.0 fL Final   MCH 07/25/2022 30.9  26.0 - 34.0 pg Final   MCHC 07/25/2022 34.1  30.0 - 36.0 g/dL Final   RDW 08/10/2535 12.2  11.5 - 15.5 % Final   Platelets 07/25/2022 248  150 - 400 K/uL Final   nRBC 07/25/2022 0.0  0.0 - 0.2 % Final   Neutrophils Relative % 07/25/2022 43  % Final   Neutro Abs 07/25/2022 3.6  1.7 - 7.7 K/uL Final   Lymphocytes Relative 07/25/2022 52  % Final   Lymphs Abs 07/25/2022 4.2 (H)  0.7 - 4.0 K/uL Final   Monocytes Relative 07/25/2022 5  % Final   Monocytes Absolute 07/25/2022 0.4  0.1 -  1.0 K/uL Final   Eosinophils Relative 07/25/2022 0  % Final   Eosinophils Absolute 07/25/2022 0.0  0.0 - 0.5 K/uL Final   Basophils Relative 07/25/2022 0  % Final   Basophils Absolute 07/25/2022 0.0  0.0 - 0.1 K/uL Final   Immature Granulocytes 07/25/2022 0  % Final   Abs Immature Granulocytes 07/25/2022 0.02  0.00 - 0.07 K/uL Final   Performed at [facility], [address], [place], [place] [address]   Sodium 07/25/2022 138  135 - 145 mmol/L Final   Potassium 07/25/2022 4.0  3.5 - 5.1 mmol/L Final   Chloride 07/25/2022 104  98 - 111 mmol/L Final   CO2 07/25/2022 29  22 - 32 mmol/L Final   Glucose, Bld 07/25/2022 83  70 - 99 mg/dL Final   Glucose reference range applies only to samples taken after fasting for at least 8 hours.   BUN 07/25/2022 11  6 - 20 mg/dL Final   Creatinine, Ser 07/25/2022 0.97  0.44 - 1.00 mg/dL Final   Calcium 47/42/5956 9.2  8.9 - 10.3 mg/dL Final   Total Protein 38/75/6433 7.0  6.5 - 8.1 g/dL Final   Albumin 29/51/8841 3.8  3.5 - 5.0 g/dL Final   AST 66/03/3015 18  15 - 41 U/L Final   ALT 07/25/2022 22  0 - 44 U/L Final   Alkaline Phosphatase 07/25/2022 64  38 - 126 U/L Final   Total Bilirubin 07/25/2022 0.2 (L)  0.3 - 1.2 mg/dL Final   GFR, Estimated 07/25/2022 >60  >60 mL/min Final   Comment: (NOTE) Calculated using the CKD-EPI Creatinine Equation (2021)    Anion gap 07/25/2022 5  5 - 15 Final   Performed at [facility], [address], [place], [place] [address]   POC Amphetamine UR 07/25/2022 None Detected  NONE DETECTED (Cut Off Level 1000 ng/mL) Preliminary   POC Secobarbital (BAR) 07/25/2022 None Detected  NONE DETECTED (Cut Off Level 300 ng/mL) Preliminary   POC Buprenorphine (BUP) 07/25/2022 None Detected  NONE DETECTED (Cut Off Level 10 ng/mL)  Preliminary   POC Oxazepam (BZO) 07/25/2022 None Detected  NONE DETECTED (Cut Off Level 300 ng/mL) Preliminary   POC Cocaine UR 07/25/2022 None Detected  NONE DETECTED (Cut Off Level 300 ng/mL)  Preliminary   POC Methamphetamine UR 07/25/2022 None Detected  NONE DETECTED (Cut Off Level 1000 ng/mL) Preliminary   POC Morphine 07/25/2022 None Detected  NONE DETECTED (Cut Off Level 300 ng/mL) Preliminary   POC Methadone UR 07/25/2022 None Detected  NONE DETECTED (Cut Off Level 300 ng/mL) Preliminary   POC Oxycodone UR 07/25/2022 None Detected  NONE DETECTED (Cut Off Level 100 ng/mL) Preliminary   POC Marijuana UR 07/25/2022 None Detected  NONE DETECTED (Cut Off Level 50 ng/mL) Preliminary   SARSCOV2ONAVIRUS 2 AG 07/25/2022 NEGATIVE  NEGATIVE Final   Comment: (NOTE) SARS-CoV-2 antigen NOT DETECTED.   Negative results are presumptive.  Negative results do not preclude SARS-CoV-2 infection and should not be used as the sole basis for treatment or other patient management decisions, including infection  control decisions, particularly in the presence of clinical signs and  symptoms consistent with COVID-19, or in those who have been in contact with the virus.  Negative results must be combined with clinical observations, patient history, and epidemiological information. The expected result is Negative.  Fact Sheet for Patients: https://www.jennings-kim.com/https://www.fda.gov/media/141569/download  Fact Sheet for Healthcare Providers: https://alexander-rogers.biz/https://www.fda.gov/media/141568/download  This test is not yet approved or cleared by the [place] FDA and  has been authorized for detection and/or diagnosis of SARS-CoV-2 by FDA under an Emergency Use Authorization (EUA).  This EUA will remain in effect (meaning this test can be used) for the duration of  the COV                          ID-19 declaration under Section 564(b)(1) of the Act, 21 U.S.C. section 360bbb-3(b)(1), unless the authorization is terminated or revoked sooner.     Cholesterol 07/25/2022 181  0 - 200 mg/dL Final   Triglycerides 16/10/960410/09/2022 118  <150 mg/dL Final   HDL 54/09/811910/09/2022 45  >40 mg/dL Final   Total CHOL/HDL Ratio 07/25/2022 4.0  RATIO Final    VLDL 07/25/2022 24  0 - 40 mg/dL Final   LDL Cholesterol 07/25/2022 112 (H)  0 - 99 mg/dL Final   Comment:        Total Cholesterol/HDL:CHD Risk Coronary Heart Disease Risk Table                     Men   Women  1/2 Average Risk   3.4   3.3  Average Risk       5.0   4.4  2 X Average Risk   9.6   7.1  3 X Average Risk  23.4   11.0        Use the calculated Patient Ratio above and the CHD Risk Table to determine the patient's CHD Risk.        ATP III CLASSIFICATION (LDL):  <100     mg/dL   Optimal  [id number]  mg/dL   Near or Above                    Optimal  130-159  mg/dL   Borderline  [id number]  mg/dL   High  >865>190     mg/dL   Very High Performed at [facility], [address] [place], [place] [address]    TSH 07/25/2022 6.668 (H)  0.350 -  4.500 uIU/mL Final   Comment: Performed by a 3rd Generation assay with a functional sensitivity of <=0.01 uIU/mL. Performed at [facility], [address] [place], [place] [address]    Glucose-Capillary 07/26/2022 104 (H)  70 - 99 mg/dL Final   Glucose reference range applies only to samples taken after fasting for at least 8 hours.   T3, Free 07/31/2022 2.3  2.0 - 4.4 pg/mL Final   Comment: (NOTE) Performed At: [facility] [address] [place], [place] [id number] Jolene SchimkeNagendra Sanjai MD AO:[id number]:[phone]    Free T4 07/31/2022 0.60 (L)  0.61 - 1.12 ng/dL Final   Comment: (NOTE) Biotin ingestion may interfere with free T4 tests. If the results are inconsistent with the TSH level, previous test results, or the clinical presentation, then consider biotin interference. If needed, order repeat testing after stopping biotin. Performed at [facility], [address] [place], [place] [address]    Glucose-Capillary 08/29/2022 100 (H)  70 - 99 mg/dL Final   Glucose reference range applies only to samples taken after fasting for at least 8 hours.   TSH 09/05/2022 0.793  0.350 - 4.500 uIU/mL Final   Comment: Performed by a  3rd Generation assay with a functional sensitivity of <=0.01 uIU/mL. Performed at [facility], [address] [place], [place] [id number]    Free T4 09/05/2022 0.73  0.61 - 1.12 ng/dL Final   Comment: (NOTE) Biotin ingestion may interfere with free T4 tests. If the results are inconsistent with the TSH level, previous test results, or the clinical presentation, then consider biotin interference. If needed, order repeat testing after stopping biotin. Performed at [facility], [address] [place], [place] [id number]    Preg Test, Ur 09/06/2022 Negative  Negative Final   Preg Test, Ur 09/06/2022 NEGATIVE  NEGATIVE Final   Comment:        THE SENSITIVITY OF THIS METHODOLOGY IS >24 mIU/mL    Preg Test, Ur 09/05/2022 NEGATIVE  NEGATIVE Final   Comment:        THE SENSITIVITY OF THIS METHODOLOGY IS >24 mIU/mL    Sodium 09/13/2022 136  135 - 145 mmol/L Final   Potassium 09/13/2022 4.5  3.5 - 5.1 mmol/L Final   Chloride 09/13/2022 105  98 - 111 mmol/L Final   CO2 09/13/2022 21 (L)  22 - 32 mmol/L Final   Glucose, Bld 09/13/2022 111 (H)  70 - 99 mg/dL Final   Glucose reference range applies only to samples taken after fasting for at least 8 hours.   BUN 09/13/2022 14  6 - 20 mg/dL Final   Creatinine, Ser 09/13/2022 0.92  0.44 - 1.00 mg/dL Final   Calcium 02/72/536612/10/2021 9.1  8.9 - 10.3 mg/dL Final   GFR, Estimated 09/13/2022 >60  >60 mL/min Final   Comment: (NOTE) Calculated using the CKD-EPI Creatinine Equation (2021)    Anion gap 09/13/2022 10  5 - 15 Final   Performed at [facility], [address] [place], [place] [address]   Vitamin B-12 09/13/2022 585  180 - 914 pg/mL Final   Comment: (NOTE) This assay is not validated for testing neonatal or myeloproliferative syndrome specimens for Vitamin B12 levels. Performed at [facility], [address] [place], [place] [address]    Color, Urine 09/14/2022 YELLOW  YELLOW Final   APPearance 09/14/2022 HAZY  (A)  CLEAR Final   Specific Gravity, Urine 09/14/2022 1.025  1.005 - 1.030 Final  pH 09/14/2022 5.0  5.0 - 8.0 Final   Glucose, UA 09/14/2022 NEGATIVE  NEGATIVE mg/dL Final   Hgb urine dipstick 09/14/2022 NEGATIVE  NEGATIVE Final   Bilirubin Urine 09/14/2022 NEGATIVE  NEGATIVE Final   Ketones, ur 09/14/2022 NEGATIVE  NEGATIVE mg/dL Final   Protein, ur 96/29/5284 NEGATIVE  NEGATIVE mg/dL Final   Nitrite 13/24/4010 NEGATIVE  NEGATIVE Final   Leukocytes,Ua 09/14/2022 TRACE (A)  NEGATIVE Final   RBC / HPF 09/14/2022 0-5  0 - 5 RBC/hpf Final   WBC, UA 09/14/2022 0-5  0 - 5 WBC/hpf Final   Bacteria, UA 09/14/2022 RARE (A)  NONE SEEN Final   Squamous Epithelial / HPF 09/14/2022 0-5  0 - 5 Final   Mucus 09/14/2022 PRESENT   Final   Performed at [facility], [address], [place], [place] [address]   Glucose-Capillary 09/16/2022 105 (H)  70 - 99 mg/dL Final   Glucose reference range applies only to samples taken after fasting for at least 8 hours.   SARS Coronavirus 2 by RT PCR 09/30/2022 NEGATIVE  NEGATIVE Final   Comment: (NOTE) SARS-CoV-2 target nucleic acids are NOT DETECTED.  The SARS-CoV-2 RNA is generally detectable in upper respiratory specimens during the acute phase of infection. The lowest concentration of SARS-CoV-2 viral copies this assay can detect is 138 copies/mL. A negative result does not preclude SARS-Cov-2 infection and should not be used as the sole basis for treatment or other patient management decisions. A negative result may occur with  improper specimen collection/handling, submission of specimen other than nasopharyngeal swab, presence of viral mutation(s) within the areas targeted by this assay, and inadequate number of viral copies(<138 copies/mL). A negative result must be combined with clinical observations, patient history, and epidemiological information. The expected result is Negative.  Fact Sheet for Patients:   BloggerCourse.com  Fact Sheet for Healthcare Providers:  SeriousBroker.it  This test is no                          t yet approved or cleared by the [place] FDA and  has been authorized for detection and/or diagnosis of SARS-CoV-2 by FDA under an Emergency Use Authorization (EUA). This EUA will remain  in effect (meaning this test can be used) for the duration of the COVID-19 declaration under Section 564(b)(1) of the Act, 21 U.S.C.section 360bbb-3(b)(1), unless the authorization is terminated  or revoked sooner.       Influenza A by PCR 09/30/2022 NEGATIVE  NEGATIVE Final   Influenza B by PCR 09/30/2022 NEGATIVE  NEGATIVE Final   Comment: (NOTE) The Xpert Xpress SARS-CoV-2/FLU/RSV plus assay is intended as an aid in the diagnosis of influenza from Nasopharyngeal swab specimens and should not be used as a sole basis for treatment. Nasal washings and aspirates are unacceptable for Xpert Xpress SARS-CoV-2/FLU/RSV testing.  Fact Sheet for Patients: BloggerCourse.com  Fact Sheet for Healthcare Providers: SeriousBroker.it  This test is not yet approved or cleared by the [place] FDA and has been authorized for detection and/or diagnosis of SARS-CoV-2 by FDA under an Emergency Use Authorization (EUA). This EUA will remain in effect (meaning this test can be used) for the duration of the COVID-19 declaration under Section 564(b)(1) of the Act, 21 U.S.C. section 360bbb-3(b)(1), unless the authorization is terminated or revoked.     Resp Syncytial Virus by PCR 09/30/2022 NEGATIVE  NEGATIVE Final   Comment: (NOTE) Fact Sheet for Patients: BloggerCourse.com  Fact Sheet  for Healthcare Providers: SeriousBroker.it  This test is not yet approved or cleared by the [place] and has been authorized for detection and/or  diagnosis of SARS-CoV-2 by FDA under an Emergency Use Authorization (EUA). This EUA will remain in effect (meaning this test can be used) for the duration of the COVID-19 declaration under Section 564(b)(1) of the Act, 21 U.S.C. section 360bbb-3(b)(1), unless the authorization is terminated or revoked.  Performed at [facility], [address] [place], [place] [address]    Sodium 10/01/2022 136  135 - 145 mmol/L Final   Potassium 10/01/2022 3.8  3.5 - 5.1 mmol/L Final   Chloride 10/01/2022 104  98 - 111 mmol/L Final   CO2 10/01/2022 27  22 - 32 mmol/L Final   Glucose, Bld 10/01/2022 98  70 - 99 mg/dL Final   Glucose reference range applies only to samples taken after fasting for at least 8 hours.   BUN 10/01/2022 12  6 - 20 mg/dL Final   Creatinine, Ser 10/01/2022 0.98  0.44 - 1.00 mg/dL Final   Calcium 58/59/2924 8.9  8.9 - 10.3 mg/dL Final   GFR, Estimated 10/01/2022 >60  >60 mL/min Final   Comment: (NOTE) Calculated using the CKD-EPI Creatinine Equation (2021)    Anion gap 10/01/2022 5  5 - 15 Final   Performed at [facility], [address], [place], [place] [address]   WBC 10/01/2022 5.1  4.0 - 10.5 K/uL Final   RBC 10/01/2022 4.31  3.87 - 5.11 MIL/uL Final   Hemoglobin 10/01/2022 12.7  12.0 - 15.0 g/dL Final   HCT 38/17/7116 38.8  36.0 - 46.0 % Final   MCV 10/01/2022 90.0  80.0 - 100.0 fL Final   MCH 10/01/2022 29.5  26.0 - 34.0 pg Final   MCHC 10/01/2022 32.7  30.0 - 36.0 g/dL Final   RDW 57/90/3833 12.4  11.5 - 15.5 % Final   Platelets 10/01/2022 300  150 - 400 K/uL Final   nRBC 10/01/2022 0.0  0.0 - 0.2 % Final   Performed at [facility], [address] [place], [place] [address]   Lipase 10/01/2022 29  11 - 51 U/L Final   Performed at [facility], [address], [place], [place] [address]   Color, Urine 10/05/2022 YELLOW  YELLOW Final   APPearance 10/05/2022 HAZY (A)  CLEAR Final   Specific Gravity, Urine 10/05/2022 1.018  1.005 - 1.030  Final   pH 10/05/2022 5.0  5.0 - 8.0 Final   Glucose, UA 10/05/2022 NEGATIVE  NEGATIVE mg/dL Final   Hgb urine dipstick 10/05/2022 NEGATIVE  NEGATIVE Final   Bilirubin Urine 10/05/2022 NEGATIVE  NEGATIVE Final   Ketones, ur 10/05/2022 NEGATIVE  NEGATIVE mg/dL Final   Protein, ur 06/00/4599 NEGATIVE  NEGATIVE mg/dL Final   Nitrite 77/41/4239 NEGATIVE  NEGATIVE Final   Leukocytes,Ua 10/05/2022 TRACE (A)  NEGATIVE Final   RBC / HPF 10/05/2022 0-5  0 - 5 RBC/hpf Final   WBC, UA 10/05/2022 0-5  0 - 5 WBC/hpf Final   Bacteria, UA 10/05/2022 RARE (A)  NONE SEEN Final   Squamous Epithelial / HPF 10/05/2022 0-5  0 - 5 Final   Mucus 10/05/2022 PRESENT   Final   Performed at [facility], [address] [place], [place] [address]   Specimen Description 10/05/2022 URINE, CLEAN CATCH   Final   Special Requests 10/05/2022    Final  Value:NONE Performed at [facility], [place] [address] [place], [place] [address]    Culture 10/05/2022 10,000 COLONIES/mL MULTIPLE SPECIES PRESENT, SUGGEST RECOLLECTION (A)   Final   Report Status 10/05/2022 10/07/2022 FINAL   Final  Admission on 07/04/2022, Discharged on 07/05/2022  Component Date Value Ref Range Status   Sodium 07/04/2022 142  135 - 145 mmol/L Final   Potassium 07/04/2022 4.4  3.5 - 5.1 mmol/L Final   Chloride 07/04/2022 109  98 - 111 mmol/L Final   CO2 07/04/2022 26  22 - 32 mmol/L Final   Glucose, Bld 07/04/2022 96  70 - 99 mg/dL Final   Glucose reference range applies only to samples taken after fasting for at least 8 hours.   BUN 07/04/2022 15  6 - 20 mg/dL Final   Creatinine, Ser 07/04/2022 0.83  0.44 - 1.00 mg/dL Final   Calcium 07/04/2022 9.3  8.9 - 10.3 mg/dL Final   Total Protein 07/04/2022 7.2  6.5 - 8.1 g/dL Final   Albumin 07/04/2022 3.9  3.5 - 5.0 g/dL Final   AST 07/04/2022 23  15 - 41 U/L Final   ALT 07/04/2022 28  0 - 44 U/L Final   Alkaline Phosphatase 07/04/2022 77  38 - 126 U/L Final   Total  Bilirubin 07/04/2022 0.4  0.3 - 1.2 mg/dL Final   GFR, Estimated 07/04/2022 >60  >60 mL/min Final   Comment: (NOTE) Calculated using the CKD-EPI Creatinine Equation (2021)    Anion gap 07/04/2022 7  5 - 15 Final   Performed at [facility], [place] [address], [place], [place] [address]   Alcohol, Ethyl (B) 07/04/2022 <10  <10 mg/dL Final   Comment: (NOTE) Lowest detectable limit for serum alcohol is 10 mg/dL.  For medical purposes only. Performed at [facility], [place] [address], [place], [place] [address]    WBC 07/04/2022 7.0  4.0 - 10.5 K/uL Final   RBC 07/04/2022 4.18  3.87 - 5.11 MIL/uL Final   Hemoglobin 07/04/2022 12.8  12.0 - 15.0 g/dL Final   HCT 07/04/2022 39.1  36.0 - 46.0 % Final   MCV 07/04/2022 93.5  80.0 - 100.0 fL Final   MCH 07/04/2022 30.6  26.0 - 34.0 pg Final   MCHC 07/04/2022 32.7  30.0 - 36.0 g/dL Final   RDW 07/04/2022 12.9  11.5 - 15.5 % Final   Platelets 07/04/2022 243  150 - 400 K/uL Final   nRBC 07/04/2022 0.0  0.0 - 0.2 % Final   Neutrophils Relative % 07/04/2022 43  % Final   Neutro Abs 07/04/2022 3.0  1.7 - 7.7 K/uL Final   Lymphocytes Relative 07/04/2022 50  % Final   Lymphs Abs 07/04/2022 3.5  0.7 - 4.0 K/uL Final   Monocytes Relative 07/04/2022 7  % Final   Monocytes Absolute 07/04/2022 0.5  0.1 - 1.0 K/uL Final   Eosinophils Relative 07/04/2022 0  % Final   Eosinophils Absolute 07/04/2022 0.0  0.0 - 0.5 K/uL Final   Basophils Relative 07/04/2022 0  % Final   Basophils Absolute 07/04/2022 0.0  0.0 - 0.1 K/uL Final   Immature Granulocytes 07/04/2022 0  % Final   Abs Immature Granulocytes 07/04/2022 0.01  0.00 - 0.07 K/uL Final   Performed at [facility], [place] [address] [place], [place] [address]   I-stat hCG, quantitative 07/04/2022 <5.0  <5 mIU/mL Final   Comment 3 07/04/2022  Final   Comment:   GEST. AGE      CONC.  (mIU/mL)   <=1 WEEK        5 - 50     2 WEEKS       50 - 500      3 WEEKS       100 - 10,000     4 WEEKS     1,000 - 30,000        FEMALE AND NON-PREGNANT FEMALE:     LESS THAN 5 mIU/mL   Admission on 06/14/2022, Discharged on 06/14/2022  Component Date Value Ref Range Status   Color, UA 06/14/2022 yellow  yellow Final   Clarity, UA 06/14/2022 cloudy (A)  clear Final   Glucose, UA 06/14/2022 negative  negative mg/dL Final   Bilirubin, UA 16/10/960409/10/2021 negative  negative Final   Ketones, POC UA 06/14/2022 negative  negative mg/dL Final   Spec Grav, UA 54/09/811909/10/2021 1.025  1.010 - 1.025 Final   Blood, UA 06/14/2022 negative  negative Final   pH, UA 06/14/2022 7.5  5.0 - 8.0 Final   Protein Ur, POC 06/14/2022 =30 (A)  negative mg/dL Final   Urobilinogen, UA 06/14/2022 0.2  0.2 or 1.0 E.U./dL Final   Nitrite, UA 14/78/295609/10/2021 Negative  Negative Final   Leukocytes, UA 06/14/2022 Negative  Negative Final   Preg Test, Ur 06/14/2022 Negative  Negative Final   Specimen Description 06/14/2022 URINE, CLEAN CATCH   Final   Special Requests 06/14/2022    Final                   Value:NONE Performed at [facility], [address] [place], [place] [address]    Culture 06/14/2022 MULTIPLE SPECIES PRESENT, SUGGEST RECOLLECTION (A)   Final   Report Status 06/14/2022 06/16/2022 FINAL   Final  Admission on 06/07/2022, Discharged on 06/10/2022  Component Date Value Ref Range Status   SARS Coronavirus 2 by RT PCR 06/07/2022 NEGATIVE  NEGATIVE Final   Comment: (NOTE) SARS-CoV-2 target nucleic acids are NOT DETECTED.  The SARS-CoV-2 RNA is generally detectable in upper respiratory specimens during the acute phase of infection. The lowest concentration of SARS-CoV-2 viral copies this assay can detect is 138 copies/mL. A negative result does not preclude SARS-Cov-2 infection and should not be used as the sole basis for treatment or other patient management decisions. A negative result may occur with  improper specimen collection/handling, submission of specimen  other than nasopharyngeal swab, presence of viral mutation(s) within the areas targeted by this assay, and inadequate number of viral copies(<138 copies/mL). A negative result must be combined with clinical observations, patient history, and epidemiological information. The expected result is Negative.  Fact Sheet for Patients:  BloggerCourse.comhttps://www.fda.gov/media/152166/download  Fact Sheet for Healthcare Providers:  SeriousBroker.ithttps://www.fda.gov/media/152162/download  This test is no                          t yet approved or cleared by the [place] FDA and  has been authorized for detection and/or diagnosis of SARS-CoV-2 by FDA under an Emergency Use Authorization (EUA). This EUA will remain  in effect (meaning this test can be used) for the duration of the COVID-19 declaration under Section 564(b)(1) of the Act, 21 U.S.C.section 360bbb-3(b)(1), unless the authorization is terminated  or revoked sooner.       Influenza A by PCR 06/07/2022 NEGATIVE  NEGATIVE Final   Influenza B by PCR 06/07/2022 NEGATIVE  NEGATIVE Final   Comment: (NOTE) The Xpert Xpress SARS-CoV-2/FLU/RSV plus assay is intended as an aid in the diagnosis of influenza from Nasopharyngeal swab specimens and should not be used as a sole basis for treatment. Nasal washings and aspirates are unacceptable for Xpert Xpress SARS-CoV-2/FLU/RSV testing.  Fact Sheet for Patients: EntrepreneurPulse.com.au  Fact Sheet for Healthcare Providers: IncredibleEmployment.be  This test is not yet approved or cleared by the [place] FDA and has been authorized for detection and/or diagnosis of SARS-CoV-2 by FDA under an Emergency Use Authorization (EUA). This EUA will remain in effect (meaning this test can be used) for the duration of the COVID-19 declaration under Section 564(b)(1) of the Act, 21 U.S.C. section 360bbb-3(b)(1), unless the authorization is terminated or revoked.  Performed at  [facility], [place] [address] [place], [place] [address]    WBC 06/07/2022 5.7  4.0 - 10.5 K/uL Final   RBC 06/07/2022 4.39  3.87 - 5.11 MIL/uL Final   Hemoglobin 06/07/2022 13.2  12.0 - 15.0 g/dL Final   HCT 06/07/2022 40.4  36.0 - 46.0 % Final   MCV 06/07/2022 92.0  80.0 - 100.0 fL Final   MCH 06/07/2022 30.1  26.0 - 34.0 pg Final   MCHC 06/07/2022 32.7  30.0 - 36.0 g/dL Final   RDW 06/07/2022 12.4  11.5 - 15.5 % Final   Platelets 06/07/2022 308  150 - 400 K/uL Final   nRBC 06/07/2022 0.0  0.0 - 0.2 % Final   Neutrophils Relative % 06/07/2022 42  % Final   Neutro Abs 06/07/2022 2.4  1.7 - 7.7 K/uL Final   Lymphocytes Relative 06/07/2022 54  % Final   Lymphs Abs 06/07/2022 3.1  0.7 - 4.0 K/uL Final   Monocytes Relative 06/07/2022 4  % Final   Monocytes Absolute 06/07/2022 0.2  0.1 - 1.0 K/uL Final   Eosinophils Relative 06/07/2022 0  % Final   Eosinophils Absolute 06/07/2022 0.0  0.0 - 0.5 K/uL Final   Basophils Relative 06/07/2022 0  % Final   Basophils Absolute 06/07/2022 0.0  0.0 - 0.1 K/uL Final   Immature Granulocytes 06/07/2022 0  % Final   Abs Immature Granulocytes 06/07/2022 0.01  0.00 - 0.07 K/uL Final   Performed at [facility], [place] [address], [place], [place] [address]   Sodium 06/07/2022 139  135 - 145 mmol/L Final   Potassium 06/07/2022 4.0  3.5 - 5.1 mmol/L Final   Chloride 06/07/2022 104  98 - 111 mmol/L Final   CO2 06/07/2022 28  22 - 32 mmol/L Final   Glucose, Bld 06/07/2022 104 (H)  70 - 99 mg/dL Final   Glucose reference range applies only to samples taken after fasting for at least 8 hours.   BUN 06/07/2022 8  6 - 20 mg/dL Final   Creatinine, Ser 06/07/2022 0.84  0.44 - 1.00 mg/dL Final   Calcium 06/07/2022 9.1  8.9 - 10.3 mg/dL Final   Total Protein 06/07/2022 6.9  6.5 - 8.1 g/dL Final   Albumin 06/07/2022 3.7  3.5 - 5.0 g/dL Final   AST 06/07/2022 19  15 - 41 U/L Final   ALT 06/07/2022 22  0 - 44 U/L Final   Alkaline Phosphatase 06/07/2022  54  38 - 126 U/L Final   Total Bilirubin 06/07/2022 0.4  0.3 - 1.2 mg/dL Final   GFR, Estimated 06/07/2022 >60  >60 mL/min Final   Comment: (NOTE) Calculated using the CKD-EPI Creatinine Equation (2021)  Anion gap 06/07/2022 7  5 - 15 Final   Performed at [facility], [address], [place], [place] [address]   Hgb A1c MFr Bld 06/07/2022 5.0  4.8 - 5.6 % Final   Comment: (NOTE) Pre diabetes:          5.7%-6.4%  Diabetes:              >6.4%  Glycemic control for   <7.0% adults with diabetes    Mean Plasma Glucose 06/07/2022 96.8  mg/dL Final   Performed at [facility], [address] [place], [place] [address]   TSH 06/07/2022 1.620  0.350 - 4.500 uIU/mL Final   Comment: Performed by a 3rd Generation assay with a functional sensitivity of <=0.01 uIU/mL. Performed at [facility], [address] [place], [place] [address]    RPR Ser Ql 06/07/2022 NON REACTIVE  NON REACTIVE Final   Performed at [facility], [address], [place], [place] [address]   Color, Urine 06/07/2022 YELLOW  YELLOW Final   APPearance 06/07/2022 HAZY (A)  CLEAR Final   Specific Gravity, Urine 06/07/2022 1.018  1.005 - 1.030 Final   pH 06/07/2022 7.0  5.0 - 8.0 Final   Glucose, UA 06/07/2022 NEGATIVE  NEGATIVE mg/dL Final   Hgb urine dipstick 06/07/2022 NEGATIVE  NEGATIVE Final   Bilirubin Urine 06/07/2022 NEGATIVE  NEGATIVE Final   Ketones, ur 06/07/2022 NEGATIVE  NEGATIVE mg/dL Final   Protein, ur 25/42/7062 NEGATIVE  NEGATIVE mg/dL Final   Nitrite 37/62/8315 NEGATIVE  NEGATIVE Final   Leukocytes,Ua 06/07/2022 NEGATIVE  NEGATIVE Final   Performed at [facility], [address], [place], [place] [address]   Cholesterol 06/07/2022 164  0 - 200 mg/dL Final   Triglycerides 07/37/1062 158 (H)  <150 mg/dL Final   HDL 69/48/5462 44  >40 mg/dL Final   Total CHOL/HDL Ratio 06/07/2022 3.7  RATIO Final   VLDL 06/07/2022 32  0 - 40 mg/dL Final   LDL Cholesterol 06/07/2022 88   0 - 99 mg/dL Final   Comment:        Total Cholesterol/HDL:CHD Risk Coronary Heart Disease Risk Table                     Men   Women  1/2 Average Risk   3.4   3.3  Average Risk       5.0   4.4  2 X Average Risk   9.6   7.1  3 X Average Risk  23.4   11.0        Use the calculated Patient Ratio above and the CHD Risk Table to determine the patient's CHD Risk.        ATP III CLASSIFICATION (LDL):  <100     mg/dL   Optimal  703-500  mg/dL   Near or Above                    Optimal  130-159  mg/dL   Borderline  938-182  mg/dL   High  >993     mg/dL   Very High Performed at [facility], [address] [place], [place] [address]    HIV Screen 4th Generation wRfx 06/07/2022 Non Reactive  Non Reactive Final   Performed at [facility], [address] [place], [place] [address]   SARSCOV2ONAVIRUS 2 AG 06/07/2022 NEGATIVE  NEGATIVE Final   Comment: (NOTE)  SARS-CoV-2 antigen NOT DETECTED.   Negative results are presumptive.  Negative results do not preclude SARS-CoV-2 infection and should not be used as the sole basis for treatment or other patient management decisions, including infection  control decisions, particularly in the presence of clinical signs and  symptoms consistent with COVID-19, or in those who have been in contact with the virus.  Negative results must be combined with clinical observations, patient history, and epidemiological information. The expected result is Negative.  Fact Sheet for Patients: HandmadeRecipes.com.cy  Fact Sheet for Healthcare Providers: FuneralLife.at  This test is not yet approved or cleared by the [place] FDA and  has been authorized for detection and/or diagnosis of SARS-CoV-2 by FDA under an Emergency Use Authorization (EUA).  This EUA will remain in effect (meaning this test can be used) for the duration of  the COV                          ID-19 declaration under  Section 564(b)(1) of the Act, 21 U.S.C. section 360bbb-3(b)(1), unless the authorization is terminated or revoked sooner.     POC Amphetamine UR 06/07/2022 None Detected  NONE DETECTED (Cut Off Level 1000 ng/mL) Final   POC Secobarbital (BAR) 06/07/2022 None Detected  NONE DETECTED (Cut Off Level 300 ng/mL) Final   POC Buprenorphine (BUP) 06/07/2022 None Detected  NONE DETECTED (Cut Off Level 10 ng/mL) Final   POC Oxazepam (BZO) 06/07/2022 None Detected  NONE DETECTED (Cut Off Level 300 ng/mL) Final   POC Cocaine UR 06/07/2022 None Detected  NONE DETECTED (Cut Off Level 300 ng/mL) Final   POC Methamphetamine UR 06/07/2022 None Detected  NONE DETECTED (Cut Off Level 1000 ng/mL) Final   POC Morphine 06/07/2022 None Detected  NONE DETECTED (Cut Off Level 300 ng/mL) Final   POC Methadone UR 06/07/2022 None Detected  NONE DETECTED (Cut Off Level 300 ng/mL) Final   POC Oxycodone UR 06/07/2022 None Detected  NONE DETECTED (Cut Off Level 100 ng/mL) Final   POC Marijuana UR 06/07/2022 None Detected  NONE DETECTED (Cut Off Level 50 ng/mL) Final   Preg Test, Ur 06/08/2022 NEGATIVE  NEGATIVE Final   Comment:        THE SENSITIVITY OF THIS METHODOLOGY IS >20 mIU/mL. Performed at [facility], [place] [address], [place], [place] [address]    Preg Test, Ur 06/08/2022 NEGATIVE  NEGATIVE Final   Comment:        THE SENSITIVITY OF THIS METHODOLOGY IS >24 mIU/mL     Blood Alcohol level:  Lab Results  Component Value Date   ETH <10 07/04/2022   ETH <10 76/73/4193    Metabolic Disorder Labs: Lab Results  Component Value Date   HGBA1C 5.7 (H) 10/15/2022   MPG 117 10/15/2022   MPG 96.8 06/07/2022   No results found for: "PROLACTIN" Lab Results  Component Value Date   CHOL 181 07/25/2022   TRIG 118 07/25/2022   HDL 45 07/25/2022   CHOLHDL 4.0 07/25/2022   VLDL 24 07/25/2022   LDLCALC 112 (H) 07/25/2022   LDLCALC 88 06/07/2022    Therapeutic Lab Levels: No results found for:  "LITHIUM" No results found for: "VALPROATE" No results found for: "CBMZ"  Physical Findings   GAD-7    Flowsheet Row Office Visit from 12/17/2021 in [facility] [place] [facility] from  [facility]  PHQ-2 Total Score 0 1 2  PHQ-9 Total Score -- 2 8      Flowsheet Row [facility] recent reading at 11/07/2022  9:32 AM [facility] Most recent reading at 10/07/2022 12:18 PM [facility] Most recent reading at 10/06/2022  9:38 AM  C-SSRS RISK CATEGORY No Risk No Risk No Risk        Musculoskeletal  Strength & Muscle Tone: within normal limits Gait & Station: normal Patient leans: N/A  Psychiatric Specialty Exam  Presentation  General Appearance:  Appropriate for Environment; Casual; Fairly Groomed  Eye Contact: Fair  Speech: Clear and Coherent; Normal Rate  Speech Volume: Normal  Handedness: Right   Mood and Affect  Mood: Euthymic  Affect: Full Range   Thought Process  Thought Processes: Coherent; Goal Directed; Linear  Descriptions of Associations:Intact  Orientation:Full (Time, Place and Person)  Thought Content:Logical  Diagnosis of Schizophrenia or Schizoaffective disorder in past: Yes  Duration of Psychotic Symptoms: Greater than six months   Hallucinations:Hallucinations: None  Ideas of Reference:None  Suicidal Thoughts:Suicidal Thoughts: No  Homicidal Thoughts:Homicidal Thoughts: No   Sensorium  Memory: Immediate Good  Judgment: Fair  Insight: Fair   Art therapistxecutive Functions   Concentration: Good  Attention Span: Good  Recall: Good  Fund of Knowledge: Good  Language: Good   Psychomotor Activity  Psychomotor Activity: Psychomotor Activity: Normal   Assets  Assets: Communication Skills; Desire for Improvement; Financial Resources/Insurance; Physical Health; Leisure Time; Resilience   Sleep  Sleep: Sleep: Good   No data recorded  Physical Exam  Physical Exam Constitutional:      General: She is not in acute distress.    Appearance: She is not ill-appearing, toxic-appearing or diaphoretic.  Eyes:     General: No scleral icterus. Cardiovascular:     Rate and Rhythm: Normal rate.  Pulmonary:     Effort: Pulmonary effort is normal. No respiratory distress.  Neurological:     Mental Status: She is alert and oriented to person, place, and time.  Psychiatric:        Attention and Perception: Attention and perception normal.        Mood and Affect: Mood and affect normal.        Speech: Speech normal.        Behavior: Behavior normal. Behavior is cooperative.        Thought Content: Thought content normal.        Cognition and Memory: Cognition and memory normal.        Judgment: Judgment normal.    Review of Systems  Constitutional:  Negative for chills and fever.  Respiratory:  Negative for shortness of breath.   Cardiovascular:  Negative for chest pain and palpitations.  Gastrointestinal:  Negative for abdominal pain.  Neurological:  Negative for headaches.   Blood pressure 107/86, pulse 98, temperature 98.5 F (36.9 C), temperature source Oral, resp. rate 18, height 5\' 1"  (1.549 m), weight 198 lb (89.8 kg), SpO2 99 %. Body mass index is 37.41 kg/m.  Treatment Plan Summary: Plan Pt remains psychiatrically cleared. Disposition is pending placement.  Lauree ChandlerJacqueline Eun Levern Pitter, NP 11/10/2022 10:01 AM

## 2022-11-11 DIAGNOSIS — F209 Schizophrenia, unspecified: Secondary | ICD-10-CM | POA: Diagnosis not present

## 2022-11-11 DIAGNOSIS — N390 Urinary tract infection, site not specified: Secondary | ICD-10-CM | POA: Diagnosis not present

## 2022-11-11 DIAGNOSIS — E039 Hypothyroidism, unspecified: Secondary | ICD-10-CM | POA: Diagnosis not present

## 2022-11-11 DIAGNOSIS — E118 Type 2 diabetes mellitus with unspecified complications: Secondary | ICD-10-CM | POA: Diagnosis not present

## 2022-11-11 DIAGNOSIS — F333 Major depressive disorder, recurrent, severe with psychotic symptoms: Secondary | ICD-10-CM | POA: Diagnosis not present

## 2022-11-11 DIAGNOSIS — F419 Anxiety disorder, unspecified: Secondary | ICD-10-CM | POA: Diagnosis not present

## 2022-11-11 DIAGNOSIS — Z1152 Encounter for screening for COVID-19: Secondary | ICD-10-CM | POA: Diagnosis not present

## 2022-11-11 DIAGNOSIS — N9489 Other specified conditions associated with female genital organs and menstrual cycle: Secondary | ICD-10-CM | POA: Diagnosis not present

## 2022-11-11 DIAGNOSIS — F17200 Nicotine dependence, unspecified, uncomplicated: Secondary | ICD-10-CM | POA: Diagnosis not present

## 2022-11-11 NOTE — ED Notes (Signed)
Pt sitting on her bed talking with staff. She is calm and cooperative. No c/o pain rod distress. Will continue to monitor for safety

## 2022-11-11 NOTE — ED Notes (Signed)
Pt A&O x 4, calm & cooperative, watching TV at present, no distress noted.  Monitoring for safety.

## 2022-11-11 NOTE — ED Provider Notes (Cosign Needed Addendum)
Behavioral Health Progress Note  Date and Time: 11/11/2022 11:34 AM Name: Nicole [name] MRN:  [id number]  Nicole [name] is a 32 y/o female with PMH of SCZ unspecified, MDD with psychosis, who presented voluntarily to [facility] (07/23/2022) via [facility] after a verbal altercation with [name] (not patient's legal guardian) for transient SI. This is patient's fourth visit to [facility] for similar concerns this year. Patient has been dismissed from previous group home due to threatening SI and HI, then leaving the group home. 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   Subjective:  Pt is seen on the unit, she sat on her bed doing some coloring, smiling and with good eye contact as she responds to my questions. Pt stated "I am feeling very good." She endorsed a good night sleep and a good appetite.  She stated "I'm just tired, this coloring is tedious" She denied any depressive symptoms, any form of hallucinations, suicidal or homicidal ideations. She is looking forward to meeting with her treatment team sometime today.    Diagnosis:  Final diagnoses:  Tobacco use disorder  Schizophrenia, unspecified type (HCC)  MDD (major depressive disorder), recurrent episode, moderate (HCC)  Hypothyroidism, unspecified type    Total Time spent with patient: 15 minutes  Past Psychiatric History:  obacco use disorder  Schizophrenia, unspecified type (HCC)  MDD (major depressive disorder), recurrent episode, moderate (HCC)  Hypothyroidism, unspecified type   Past Medical History:  Hypothyroidism  Family History: Hypertension - father diabetes  Family Psychiatric  History: None reported  Social History: Currently seeking placement   Additional Social History:                         Sleep: Good  Appetite:  Good  Current Medications:  Current Facility-Administered Medications  Medication Dose Route Frequency Provider Last Rate Last Admin   acetaminophen (TYLENOL) tablet 650  mg  650 mg Oral Q6H PRN Marlou Sa, NP   650 mg at 11/04/22 2154   alum & mag hydroxide-simeth (MAALOX/MYLANTA) 200-200-20 MG/5ML suspension 30 mL  30 mL Oral Q4H PRN Marlou Sa, NP   30 mL at 11/11/22 0052   ARIPiprazole ER (ABILIFY MAINTENA) 400 MG prefilled syringe 400 mg  400 mg Intramuscular Q28 days Princess Bruins, DO   400 mg at 10/25/22 0920   cetaphil lotion   Topical PRN Princess Bruins, DO       fluticasone (FLONASE) 50 MCG/ACT nasal spray 1 spray  1 spray Each Nare QHS Lauree Chandler, NP       hydrOXYzine (ATARAX) tablet 25 mg  25 mg Oral TID PRN Lorri Frederick, MD   25 mg at 11/09/22 1116   ketoconazole (NIZORAL) 2 % cream   Topical BID Princess Bruins, DO   Given at 11/11/22 1000   levothyroxine (SYNTHROID) tablet 100 mcg  100 mcg Oral Once Byungura, Veronique M, NP       levothyroxine (SYNTHROID) tablet 100 mcg  100 mcg Oral Q0600 Marlou Sa, NP   100 mcg at 11/11/22 0612   lidocaine (LIDODERM) 5 % 1 patch  1 patch Transdermal Q24H Princess Bruins, DO   1 patch at 11/11/22 1000   loratadine (CLARITIN) tablet 10 mg  10 mg Oral Daily Carrion-Carrero, Margely, MD   10 mg at 11/11/22 1001   magnesium hydroxide (MILK OF MAGNESIA) suspension 30 mL  30 mL Oral Daily PRN Marlou Sa, NP   30 mL at 10/12/22  2145   metFORMIN (GLUCOPHAGE) tablet 500 mg  500 mg Oral Q breakfast Carrion-Carrero, Margely, MD   500 mg at 11/11/22 1002   nicotine (NICODERM CQ - dosed in mg/24 hours) patch 14 mg  14 mg Transdermal Daily PRN Carrion-Carrero, Margely, MD       ondansetron (ZOFRAN-ODT) disintegrating tablet 4 mg  4 mg Oral Q8H PRN Carrion-Carrero, Margely, MD   4 mg at 11/01/22 2246   Oxcarbazepine (TRILEPTAL) tablet 300 mg  300 mg Oral BID Byungura, Veronique M, NP   300 mg at 11/11/22 1001   pantoprazole (PROTONIX) EC tablet 40 mg  40 mg Oral Daily Carrion-Carrero, Margely, MD   40 mg at 11/11/22 1001   polyethylene glycol (MIRALAX / GLYCOLAX) packet  17 g  17 g Oral Daily Princess Bruins, DO   17 g at 11/11/22 1001   QUEtiapine (SEROQUEL) tablet 400 mg  400 mg Oral BID Byungura, Veronique M, NP   400 mg at 11/11/22 1001   sertraline (ZOLOFT) tablet 150 mg  150 mg Oral Daily Carrion-Carrero, Margely, MD   150 mg at 11/11/22 1001   sodium chloride (OCEAN) 0.65 % nasal spray 1 spray  1 spray Each Nare Daily Princess Bruins, DO   1 spray at 11/11/22 1000   traZODone (DESYREL) tablet 100 mg  100 mg Oral QHS Olin Pia M, NP   100 mg at 11/10/22 2146   traZODone (DESYREL) tablet 50 mg  50 mg Oral QHS PRN Marlou Sa, NP   50 mg at 11/09/22 2109   valACYclovir (VALTREX) tablet 500 mg  500 mg Oral Daily Carrion-Carrero, Margely, MD   500 mg at 11/11/22 1001   zinc oxide (BALMEX) 11.3 % cream   Topical PRN Princess Bruins, DO       Current Outpatient Medications  Medication Sig Dispense Refill   ABILIFY MAINTENA 400 MG PRSY prefilled syringe 400 mg every 28 (twenty-eight) days.     cetirizine (ZYRTEC) 10 MG tablet Take 10 mg by mouth daily.     cyclobenzaprine (FLEXERIL) 10 MG tablet Take 1 tablet (10 mg total) by mouth 2 (two) times daily as needed for muscle spasms. 20 tablet 0   fluticasone (FLONASE) 50 MCG/ACT nasal spray Place 1 spray into both nostrils daily.     meloxicam (MOBIC) 15 MG tablet Take 15 mg by mouth daily.     nitrofurantoin, macrocrystal-monohydrate, (MACROBID) 100 MG capsule Take 1 capsule (100 mg total) by mouth 2 (two) times daily. 10 capsule 0   ondansetron (ZOFRAN-ODT) 4 MG disintegrating tablet Take 1 tablet (4 mg total) by mouth every 8 (eight) hours as needed for nausea or vomiting. 20 tablet 0   Oxcarbazepine (TRILEPTAL) 300 MG tablet Take 1 tablet (300 mg total) by mouth 2 (two) times daily. 60 tablet 0   QUEtiapine (SEROQUEL) 400 MG tablet Take 1 tablet (400 mg total) by mouth 2 (two) times daily. 60 tablet 0   sertraline (ZOLOFT) 50 MG tablet Take 3 tablets (150 mg total) by mouth in the morning. 90 tablet  0   traZODone (DESYREL) 100 MG tablet Take 1 tablet (100 mg total) by mouth at bedtime. 30 tablet 0   valACYclovir (VALTREX) 500 MG tablet Take 500 mg by mouth daily.      Labs  Lab Results:  Admission on 10/07/2022  Component Date Value Ref Range Status   Hgb A1c MFr Bld 10/15/2022 5.7 (H)  4.8 - 5.6 % Final   Comment: (NOTE)  Prediabetes: 5.7 - 6.4         Diabetes: >6.4         Glycemic control for adults with diabetes: <7.0    Mean Plasma Glucose 10/15/2022 117  mg/dL Final   Comment: (NOTE) Performed At: [facility] [address] [place], [place] [id number] Jolene Schimke MD WU:[id number]   Office Visit on 10/21/2022  Component Date Value Ref Range Status   SARSCOV2ONAVIRUS 2 AG 11/01/2022 NEGATIVE  NEGATIVE Final   Comment: (NOTE) SARS-CoV-2 antigen NOT DETECTED.   Negative results are presumptive.  Negative results do not preclude SARS-CoV-2 infection and should not be used as the sole basis for treatment or other patient management decisions, including infection  control decisions, particularly in the presence of clinical signs and  symptoms consistent with COVID-19, or in those who have been in contact with the virus.  Negative results must be combined with clinical observations, patient history, and epidemiological information. The expected result is Negative.  Fact Sheet for Patients: https://www.jennings-kim.com/  Fact Sheet for Healthcare Providers: https://alexander-rogers.biz/  This test is not yet approved or cleared by the [place] FDA and  has been authorized for detection and/or diagnosis of SARS-CoV-2 by FDA under an Emergency Use Authorization (EUA).  This EUA will remain in effect (meaning this test can be used) for the duration of  the COV                          ID-19 declaration under Section 564(b)(1) of the Act, 21 U.S.C. section 360bbb-3(b)(1), unless the authorization is terminated or revoked  sooner.    Admission on 10/07/2022, Discharged on 10/07/2022  Component Date Value Ref Range Status   Color, Urine 10/07/2022 YELLOW  YELLOW Final   APPearance 10/07/2022 HAZY (A)  CLEAR Final   Specific Gravity, Urine 10/07/2022 1.011  1.005 - 1.030 Final   pH 10/07/2022 7.0  5.0 - 8.0 Final   Glucose, UA 10/07/2022 NEGATIVE  NEGATIVE mg/dL Final   Hgb urine dipstick 10/07/2022 SMALL (A)  NEGATIVE Final   Bilirubin Urine 10/07/2022 NEGATIVE  NEGATIVE Final   Ketones, ur 10/07/2022 NEGATIVE  NEGATIVE mg/dL Final   Protein, ur 95/62/1308 NEGATIVE  NEGATIVE mg/dL Final   Nitrite 65/78/4696 NEGATIVE  NEGATIVE Final   Leukocytes,Ua 10/07/2022 LARGE (A)  NEGATIVE Final   RBC / HPF 10/07/2022 0-5  0 - 5 RBC/hpf Final   WBC, UA 10/07/2022 0-5  0 - 5 WBC/hpf Final   Bacteria, UA 10/07/2022 FEW (A)  NONE SEEN Final   Squamous Epithelial / HPF 10/07/2022 11-20  0 - 5 Final   Performed at [facility], [address] [place], [place] [address]   I-stat hCG, quantitative 10/07/2022 <5.0  <5 mIU/mL Final   Comment 3 10/07/2022          Final   Comment:   GEST. AGE      CONC.  (mIU/mL)   <=1 WEEK        5 - 50     2 WEEKS       50 - 500     3 WEEKS       100 - 10,000     4 WEEKS     1,000 - 30,000        FEMALE AND NON-PREGNANT FEMALE:     LESS THAN 5 mIU/mL    Sodium 10/07/2022 138  135 - 145 mmol/L Final   Potassium 10/07/2022  4.3  3.5 - 5.1 mmol/L Final   Chloride 10/07/2022 104  98 - 111 mmol/L Final   CO2 10/07/2022 28  22 - 32 mmol/L Final   Glucose, Bld 10/07/2022 90  70 - 99 mg/dL Final   Glucose reference range applies only to samples taken after fasting for at least 8 hours.   BUN 10/07/2022 8  6 - 20 mg/dL Final   Creatinine, Ser 10/07/2022 0.96  0.44 - 1.00 mg/dL Final   Calcium 16/07/9603 9.1  8.9 - 10.3 mg/dL Final   GFR, Estimated 10/07/2022 >60  >60 mL/min Final   Comment: (NOTE) Calculated using the CKD-EPI Creatinine Equation (2021)    Anion gap 10/07/2022 6  5 -  15 Final   Performed at [facility], [address], [place], [place] [address]   WBC 10/07/2022 5.8  4.0 - 10.5 K/uL Final   RBC 10/07/2022 4.36  3.87 - 5.11 MIL/uL Final   Hemoglobin 10/07/2022 12.9  12.0 - 15.0 g/dL Final   HCT 11/91/4782 40.0  36.0 - 46.0 % Final   MCV 10/07/2022 91.7  80.0 - 100.0 fL Final   MCH 10/07/2022 29.6  26.0 - 34.0 pg Final   MCHC 10/07/2022 32.3  30.0 - 36.0 g/dL Final   RDW 95/62/1308 12.4  11.5 - 15.5 % Final   Platelets 10/07/2022 295  150 - 400 K/uL Final   nRBC 10/07/2022 0.0  0.0 - 0.2 % Final   Performed at [facility], [address], [place], [place] [address]   SARS Coronavirus 2 by RT PCR 10/07/2022 NEGATIVE  NEGATIVE Final   Comment: (NOTE) SARS-CoV-2 target nucleic acids are NOT DETECTED.  The SARS-CoV-2 RNA is generally detectable in upper respiratory specimens during the acute phase of infection. The lowest concentration of SARS-CoV-2 viral copies this assay can detect is 138 copies/mL. A negative result does not preclude SARS-Cov-2 infection and should not be used as the sole basis for treatment or other patient management decisions. A negative result may occur with  improper specimen collection/handling, submission of specimen other than nasopharyngeal swab, presence of viral mutation(s) within the areas targeted by this assay, and inadequate number of viral copies(<138 copies/mL). A negative result must be combined with clinical observations, patient history, and epidemiological information. The expected result is Negative.  Fact Sheet for Patients:  BloggerCourse.com  Fact Sheet for Healthcare Providers:  SeriousBroker.it  This test is no                          t yet approved or cleared by the [place] FDA and  has been authorized for detection and/or diagnosis of SARS-CoV-2 by FDA under an Emergency Use Authorization (EUA). This EUA will remain  in effect  (meaning this test can be used) for the duration of the COVID-19 declaration under Section 564(b)(1) of the Act, 21 U.S.C.section 360bbb-3(b)(1), unless the authorization is terminated  or revoked sooner.       Influenza A by PCR 10/07/2022 NEGATIVE  NEGATIVE Final   Influenza B by PCR 10/07/2022 NEGATIVE  NEGATIVE Final   Comment: (NOTE) The Xpert Xpress SARS-CoV-2/FLU/RSV plus assay is intended as an aid in the diagnosis of influenza from Nasopharyngeal swab specimens and should not be used as a sole basis for treatment. Nasal washings and aspirates are unacceptable for Xpert Xpress SARS-CoV-2/FLU/RSV testing.  Fact Sheet for Patients: BloggerCourse.com  Fact Sheet for Healthcare Providers: SeriousBroker.it  This test  is not yet approved or cleared by the [place] and has been authorized for detection and/or diagnosis of SARS-CoV-2 by FDA under an Emergency Use Authorization (EUA). This EUA will remain in effect (meaning this test can be used) for the duration of the COVID-19 declaration under Section 564(b)(1) of the Act, 21 U.S.C. section 360bbb-3(b)(1), unless the authorization is terminated or revoked.     Resp Syncytial Virus by PCR 10/07/2022 NEGATIVE  NEGATIVE Final   Comment: (NOTE) Fact Sheet for Patients: EntrepreneurPulse.com.au  Fact Sheet for Healthcare Providers: IncredibleEmployment.be  This test is not yet approved or cleared by the [place] FDA and has been authorized for detection and/or diagnosis of SARS-CoV-2 by FDA under an Emergency Use Authorization (EUA). This EUA will remain in effect (meaning this test can be used) for the duration of the COVID-19 declaration under Section 564(b)(1) of the Act, 21 U.S.C. section 360bbb-3(b)(1), unless the authorization is terminated or revoked.  Performed at [facility], [place] [address], [place],  [place] [address]    Specimen Description 10/07/2022 URINE, CLEAN CATCH   Final   Special Requests 10/07/2022    Final                   Value:NONE Performed at [facility], [place] [address], [place], [place] [address]    Culture 10/07/2022 MULTIPLE SPECIES PRESENT, SUGGEST RECOLLECTION (A)   Final   Report Status 10/07/2022 10/08/2022 FINAL   Final  Admission on 07/25/2022, Discharged on 10/07/2022  Component Date Value Ref Range Status   SARS Coronavirus 2 by RT PCR 07/25/2022 NEGATIVE  NEGATIVE Final   Comment: (NOTE) SARS-CoV-2 target nucleic acids are NOT DETECTED.  The SARS-CoV-2 RNA is generally detectable in upper respiratory specimens during the acute phase of infection. The lowest concentration of SARS-CoV-2 viral copies this assay can detect is 138 copies/mL. A negative result does not preclude SARS-Cov-2 infection and should not be used as the sole basis for treatment or other patient management decisions. A negative result may occur with  improper specimen collection/handling, submission of specimen other than nasopharyngeal swab, presence of viral mutation(s) within the areas targeted by this assay, and inadequate number of viral copies(<138 copies/mL). A negative result must be combined with clinical observations, patient history, and epidemiological information. The expected result is Negative.  Fact Sheet for Patients:  EntrepreneurPulse.com.au  Fact Sheet for Healthcare Providers:  IncredibleEmployment.be  This test is no                          t yet approved or cleared by the [place] FDA and  has been authorized for detection and/or diagnosis of SARS-CoV-2 by FDA under an Emergency Use Authorization (EUA). This EUA will remain  in effect (meaning this test can be used) for the duration of the COVID-19 declaration under Section 564(b)(1) of the Act, 21 U.S.C.section 360bbb-3(b)(1), unless the authorization is terminated   or revoked sooner.       Influenza A by PCR 07/25/2022 NEGATIVE  NEGATIVE Final   Influenza B by PCR 07/25/2022 NEGATIVE  NEGATIVE Final   Comment: (NOTE) The Xpert Xpress SARS-CoV-2/FLU/RSV plus assay is intended as an aid in the diagnosis of influenza from Nasopharyngeal swab specimens and should not be used as a sole basis for treatment. Nasal washings and aspirates are unacceptable for Xpert Xpress SARS-CoV-2/FLU/RSV testing.  Fact Sheet for Patients: EntrepreneurPulse.com.au  Fact Sheet for Healthcare Providers: IncredibleEmployment.be  This test is not yet approved or cleared by the [place] and has been authorized for detection and/or diagnosis of SARS-CoV-2 by FDA under an Emergency Use Authorization (EUA). This EUA will remain in effect (meaning this test can be used) for the duration of the COVID-19 declaration under Section 564(b)(1) of the Act, 21 U.S.C. section 360bbb-3(b)(1), unless the authorization is terminated or revoked.  Performed at [facility], [address] [place], [place] [address]    WBC 07/25/2022 8.3  4.0 - 10.5 K/uL Final   RBC 07/25/2022 4.44  3.87 - 5.11 MIL/uL Final   Hemoglobin 07/25/2022 13.7  12.0 - 15.0 g/dL Final   HCT 60/45/4098 40.2  36.0 - 46.0 % Final   MCV 07/25/2022 90.5  80.0 - 100.0 fL Final   MCH 07/25/2022 30.9  26.0 - 34.0 pg Final   MCHC 07/25/2022 34.1  30.0 - 36.0 g/dL Final   RDW 11/91/4782 12.2  11.5 - 15.5 % Final   Platelets 07/25/2022 248  150 - 400 K/uL Final   nRBC 07/25/2022 0.0  0.0 - 0.2 % Final   Neutrophils Relative % 07/25/2022 43  % Final   Neutro Abs 07/25/2022 3.6  1.7 - 7.7 K/uL Final   Lymphocytes Relative 07/25/2022 52  % Final   Lymphs Abs 07/25/2022 4.2 (H)  0.7 - 4.0 K/uL Final   Monocytes Relative 07/25/2022 5  % Final   Monocytes Absolute 07/25/2022 0.4  0.1 - 1.0 K/uL Final   Eosinophils Relative 07/25/2022 0  % Final   Eosinophils Absolute  07/25/2022 0.0  0.0 - 0.5 K/uL Final   Basophils Relative 07/25/2022 0  % Final   Basophils Absolute 07/25/2022 0.0  0.0 - 0.1 K/uL Final   Immature Granulocytes 07/25/2022 0  % Final   Abs Immature Granulocytes 07/25/2022 0.02  0.00 - 0.07 K/uL Final   Performed at [facility], [address] [place], [place] [address]   Sodium 07/25/2022 138  135 - 145 mmol/L Final   Potassium 07/25/2022 4.0  3.5 - 5.1 mmol/L Final   Chloride 07/25/2022 104  98 - 111 mmol/L Final   CO2 07/25/2022 29  22 - 32 mmol/L Final   Glucose, Bld 07/25/2022 83  70 - 99 mg/dL Final   Glucose reference range applies only to samples taken after fasting for at least 8 hours.   BUN 07/25/2022 11  6 - 20 mg/dL Final   Creatinine, Ser 07/25/2022 0.97  0.44 - 1.00 mg/dL Final   Calcium 30/86/5784 9.2  8.9 - 10.3 mg/dL Final   Total Protein 69/62/9528 7.0  6.5 - 8.1 g/dL Final   Albumin 41/32/4401 3.8  3.5 - 5.0 g/dL Final   AST 02/72/5366 18  15 - 41 U/L Final   ALT 07/25/2022 22  0 - 44 U/L Final   Alkaline Phosphatase 07/25/2022 64  38 - 126 U/L Final   Total Bilirubin 07/25/2022 0.2 (L)  0.3 - 1.2 mg/dL Final   GFR, Estimated 07/25/2022 >60  >60 mL/min Final   Comment: (NOTE) Calculated using the CKD-EPI Creatinine Equation (2021)    Anion gap 07/25/2022 5  5 - 15 Final   Performed at [facility], [address] [place], [place] [address]   POC Amphetamine UR 07/25/2022 None Detected  NONE DETECTED (Cut Off Level 1000 ng/mL) Preliminary   POC Secobarbital (BAR) 07/25/2022 None Detected  NONE DETECTED (Cut Off Level 300 ng/mL) Preliminary   POC Buprenorphine (  BUP) 07/25/2022 None Detected  NONE DETECTED (Cut Off Level 10 ng/mL) Preliminary   POC Oxazepam (BZO) 07/25/2022 None Detected  NONE DETECTED (Cut Off Level 300 ng/mL) Preliminary   POC Cocaine UR 07/25/2022 None Detected  NONE DETECTED (Cut Off Level 300 ng/mL) Preliminary   POC Methamphetamine UR 07/25/2022 None Detected  NONE DETECTED (Cut  Off Level 1000 ng/mL) Preliminary   POC Morphine 07/25/2022 None Detected  NONE DETECTED (Cut Off Level 300 ng/mL) Preliminary   POC Methadone UR 07/25/2022 None Detected  NONE DETECTED (Cut Off Level 300 ng/mL) Preliminary   POC Oxycodone UR 07/25/2022 None Detected  NONE DETECTED (Cut Off Level 100 ng/mL) Preliminary   POC Marijuana UR 07/25/2022 None Detected  NONE DETECTED (Cut Off Level 50 ng/mL) Preliminary   SARSCOV2ONAVIRUS 2 AG 07/25/2022 NEGATIVE  NEGATIVE Final   Comment: (NOTE) SARS-CoV-2 antigen NOT DETECTED.   Negative results are presumptive.  Negative results do not preclude SARS-CoV-2 infection and should not be used as the sole basis for treatment or other patient management decisions, including infection  control decisions, particularly in the presence of clinical signs and  symptoms consistent with COVID-19, or in those who have been in contact with the virus.  Negative results must be combined with clinical observations, patient history, and epidemiological information. The expected result is Negative.  Fact Sheet for Patients: https://www.jennings-kim.com/  Fact Sheet for Healthcare Providers: https://alexander-rogers.biz/  This test is not yet approved or cleared by the [place] FDA and  has been authorized for detection and/or diagnosis of SARS-CoV-2 by FDA under an Emergency Use Authorization (EUA).  This EUA will remain in effect (meaning this test can be used) for the duration of  the COV                          ID-19 declaration under Section 564(b)(1) of the Act, 21 U.S.C. section 360bbb-3(b)(1), unless the authorization is terminated or revoked sooner.     Cholesterol 07/25/2022 181  0 - 200 mg/dL Final   Triglycerides 03/70/4888 118  <150 mg/dL Final   HDL 91/69/4503 45  >40 mg/dL Final   Total CHOL/HDL Ratio 07/25/2022 4.0  RATIO Final   VLDL 07/25/2022 24  0 - 40 mg/dL Final   LDL Cholesterol 07/25/2022 112 (H)  0 -  99 mg/dL Final   Comment:        Total Cholesterol/HDL:CHD Risk Coronary Heart Disease Risk Table                     Men   Women  1/2 Average Risk   3.4   3.3  Average Risk       5.0   4.4  2 X Average Risk   9.6   7.1  3 X Average Risk  23.4   11.0        Use the calculated Patient Ratio above and the CHD Risk Table to determine the patient's CHD Risk.        ATP III CLASSIFICATION (LDL):  <100     mg/dL   Optimal  888-280  mg/dL   Near or Above                    Optimal  130-159  mg/dL   Borderline  034-917  mg/dL   High  >915     mg/dL   Very High Performed at [facility], [address],  [place], [place] [address]    TSH 07/25/2022 6.668 (H)  0.350 - 4.500 uIU/mL Final   Comment: Performed by a 3rd Generation assay with a functional sensitivity of <=0.01 uIU/mL. Performed at [facility], [address] [place], [place] [address]    Glucose-Capillary 07/26/2022 104 (H)  70 - 99 mg/dL Final   Glucose reference range applies only to samples taken after fasting for at least 8 hours.   T3, Free 07/31/2022 2.3  2.0 - 4.4 pg/mL Final   Comment: (NOTE) Performed At: [facility] [address] [place], [place] [id number] Jolene SchimkeNagendra Sanjai MD WG:[id number]:[phone]    Free T4 07/31/2022 0.60 (L)  0.61 - 1.12 ng/dL Final   Comment: (NOTE) Biotin ingestion may interfere with free T4 tests. If the results are inconsistent with the TSH level, previous test results, or the clinical presentation, then consider biotin interference. If needed, order repeat testing after stopping biotin. Performed at [facility], [address] [place], [place] [address]    Glucose-Capillary 08/29/2022 100 (H)  70 - 99 mg/dL Final   Glucose reference range applies only to samples taken after fasting for at least 8 hours.   TSH 09/05/2022 0.793  0.350 - 4.500 uIU/mL Final   Comment: Performed by a 3rd Generation assay with a functional sensitivity of <=0.01 uIU/mL. Performed at  [facility], [address] [place], [place] [address]    Free T4 09/05/2022 0.73  0.61 - 1.12 ng/dL Final   Comment: (NOTE) Biotin ingestion may interfere with free T4 tests. If the results are inconsistent with the TSH level, previous test results, or the clinical presentation, then consider biotin interference. If needed, order repeat testing after stopping biotin. Performed at [facility], [address] [place], [place] [address]    Preg Test, Ur 09/06/2022 Negative  Negative Final   Preg Test, Ur 09/06/2022 NEGATIVE  NEGATIVE Final   Comment:        THE SENSITIVITY OF THIS METHODOLOGY IS >24 mIU/mL    Preg Test, Ur 09/05/2022 NEGATIVE  NEGATIVE Final   Comment:        THE SENSITIVITY OF THIS METHODOLOGY IS >24 mIU/mL    Sodium 09/13/2022 136  135 - 145 mmol/L Final   Potassium 09/13/2022 4.5  3.5 - 5.1 mmol/L Final   Chloride 09/13/2022 105  98 - 111 mmol/L Final   CO2 09/13/2022 21 (L)  22 - 32 mmol/L Final   Glucose, Bld 09/13/2022 111 (H)  70 - 99 mg/dL Final   Glucose reference range applies only to samples taken after fasting for at least 8 hours.   BUN 09/13/2022 14  6 - 20 mg/dL Final   Creatinine, Ser 09/13/2022 0.92  0.44 - 1.00 mg/dL Final   Calcium 44/01/027212/10/2021 9.1  8.9 - 10.3 mg/dL Final   GFR, Estimated 09/13/2022 >60  >60 mL/min Final   Comment: (NOTE) Calculated using the CKD-EPI Creatinine Equation (2021)    Anion gap 09/13/2022 10  5 - 15 Final   Performed at [facility], [address] [place], [place] [address]   Vitamin B-12 09/13/2022 585  180 - 914 pg/mL Final   Comment: (NOTE) This assay is not validated for testing neonatal or myeloproliferative syndrome specimens for Vitamin B12 levels. Performed at [facility], [address] [place], [place] [address]    Color, Urine 09/14/2022 YELLOW  YELLOW Final   APPearance 09/14/2022 HAZY (A)  CLEAR Final  Specific Gravity, Urine 09/14/2022 1.025  1.005 - 1.030 Final    pH 09/14/2022 5.0  5.0 - 8.0 Final   Glucose, UA 09/14/2022 NEGATIVE  NEGATIVE mg/dL Final   Hgb urine dipstick 09/14/2022 NEGATIVE  NEGATIVE Final   Bilirubin Urine 09/14/2022 NEGATIVE  NEGATIVE Final   Ketones, ur 09/14/2022 NEGATIVE  NEGATIVE mg/dL Final   Protein, ur 16/07/9603 NEGATIVE  NEGATIVE mg/dL Final   Nitrite 54/06/8118 NEGATIVE  NEGATIVE Final   Leukocytes,Ua 09/14/2022 TRACE (A)  NEGATIVE Final   RBC / HPF 09/14/2022 0-5  0 - 5 RBC/hpf Final   WBC, UA 09/14/2022 0-5  0 - 5 WBC/hpf Final   Bacteria, UA 09/14/2022 RARE (A)  NONE SEEN Final   Squamous Epithelial / HPF 09/14/2022 0-5  0 - 5 Final   Mucus 09/14/2022 PRESENT   Final   Performed at [facility], [address], [place], [place] [address]   Glucose-Capillary 09/16/2022 105 (H)  70 - 99 mg/dL Final   Glucose reference range applies only to samples taken after fasting for at least 8 hours.   SARS Coronavirus 2 by RT PCR 09/30/2022 NEGATIVE  NEGATIVE Final   Comment: (NOTE) SARS-CoV-2 target nucleic acids are NOT DETECTED.  The SARS-CoV-2 RNA is generally detectable in upper respiratory specimens during the acute phase of infection. The lowest concentration of SARS-CoV-2 viral copies this assay can detect is 138 copies/mL. A negative result does not preclude SARS-Cov-2 infection and should not be used as the sole basis for treatment or other patient management decisions. A negative result may occur with  improper specimen collection/handling, submission of specimen other than nasopharyngeal swab, presence of viral mutation(s) within the areas targeted by this assay, and inadequate number of viral copies(<138 copies/mL). A negative result must be combined with clinical observations, patient history, and epidemiological information. The expected result is Negative.  Fact Sheet for Patients:  BloggerCourse.com  Fact Sheet for Healthcare Providers:   SeriousBroker.it  This test is no                          t yet approved or cleared by the [place] FDA and  has been authorized for detection and/or diagnosis of SARS-CoV-2 by FDA under an Emergency Use Authorization (EUA). This EUA will remain  in effect (meaning this test can be used) for the duration of the COVID-19 declaration under Section 564(b)(1) of the Act, 21 U.S.C.section 360bbb-3(b)(1), unless the authorization is terminated  or revoked sooner.       Influenza A by PCR 09/30/2022 NEGATIVE  NEGATIVE Final   Influenza B by PCR 09/30/2022 NEGATIVE  NEGATIVE Final   Comment: (NOTE) The Xpert Xpress SARS-CoV-2/FLU/RSV plus assay is intended as an aid in the diagnosis of influenza from Nasopharyngeal swab specimens and should not be used as a sole basis for treatment. Nasal washings and aspirates are unacceptable for Xpert Xpress SARS-CoV-2/FLU/RSV testing.  Fact Sheet for Patients: BloggerCourse.com  Fact Sheet for Healthcare Providers: SeriousBroker.it  This test is not yet approved or cleared by the [place] FDA and has been authorized for detection and/or diagnosis of SARS-CoV-2 by FDA under an Emergency Use Authorization (EUA). This EUA will remain in effect (meaning this test can be used) for the duration of the COVID-19 declaration under Section 564(b)(1) of the Act, 21 U.S.C. section 360bbb-3(b)(1), unless the authorization is terminated or revoked.     Resp Syncytial Virus by PCR 09/30/2022 NEGATIVE  NEGATIVE  Final   Comment: (NOTE) Fact Sheet for Patients: BloggerCourse.com  Fact Sheet for Healthcare Providers: SeriousBroker.it  This test is not yet approved or cleared by the [place] FDA and has been authorized for detection and/or diagnosis of SARS-CoV-2 by FDA under an Emergency Use Authorization (EUA). This EUA  will remain in effect (meaning this test can be used) for the duration of the COVID-19 declaration under Section 564(b)(1) of the Act, 21 U.S.C. section 360bbb-3(b)(1), unless the authorization is terminated or revoked.  Performed at [facility], [address] [place], [place] [address]    Sodium 10/01/2022 136  135 - 145 mmol/L Final   Potassium 10/01/2022 3.8  3.5 - 5.1 mmol/L Final   Chloride 10/01/2022 104  98 - 111 mmol/L Final   CO2 10/01/2022 27  22 - 32 mmol/L Final   Glucose, Bld 10/01/2022 98  70 - 99 mg/dL Final   Glucose reference range applies only to samples taken after fasting for at least 8 hours.   BUN 10/01/2022 12  6 - 20 mg/dL Final   Creatinine, Ser 10/01/2022 0.98  0.44 - 1.00 mg/dL Final   Calcium 91/47/8295 8.9  8.9 - 10.3 mg/dL Final   GFR, Estimated 10/01/2022 >60  >60 mL/min Final   Comment: (NOTE) Calculated using the CKD-EPI Creatinine Equation (2021)    Anion gap 10/01/2022 5  5 - 15 Final   Performed at [facility], [address] [place], [place] [address]   WBC 10/01/2022 5.1  4.0 - 10.5 K/uL Final   RBC 10/01/2022 4.31  3.87 - 5.11 MIL/uL Final   Hemoglobin 10/01/2022 12.7  12.0 - 15.0 g/dL Final   HCT 86/57/8469 38.8  36.0 - 46.0 % Final   MCV 10/01/2022 90.0  80.0 - 100.0 fL Final   MCH 10/01/2022 29.5  26.0 - 34.0 pg Final   MCHC 10/01/2022 32.7  30.0 - 36.0 g/dL Final   RDW 62/95/2841 12.4  11.5 - 15.5 % Final   Platelets 10/01/2022 300  150 - 400 K/uL Final   nRBC 10/01/2022 0.0  0.0 - 0.2 % Final   Performed at [facility], [address], [place], [place] [address]   Lipase 10/01/2022 29  11 - 51 U/L Final   Performed at [facility], [address] [place], [place] [address]   Color, Urine 10/05/2022 YELLOW  YELLOW Final   APPearance 10/05/2022 HAZY (A)  CLEAR Final   Specific Gravity, Urine 10/05/2022 1.018  1.005 - 1.030 Final   pH 10/05/2022 5.0  5.0 - 8.0 Final   Glucose, UA 10/05/2022 NEGATIVE   NEGATIVE mg/dL Final   Hgb urine dipstick 10/05/2022 NEGATIVE  NEGATIVE Final   Bilirubin Urine 10/05/2022 NEGATIVE  NEGATIVE Final   Ketones, ur 10/05/2022 NEGATIVE  NEGATIVE mg/dL Final   Protein, ur 53/66/4403 NEGATIVE  NEGATIVE mg/dL Final   Nitrite 47/42/5956 NEGATIVE  NEGATIVE Final   Leukocytes,Ua 10/05/2022 TRACE (A)  NEGATIVE Final   RBC / HPF 10/05/2022 0-5  0 - 5 RBC/hpf Final   WBC, UA 10/05/2022 0-5  0 - 5 WBC/hpf Final   Bacteria, UA 10/05/2022 RARE (A)  NONE SEEN Final   Squamous Epithelial / HPF 10/05/2022 0-5  0 - 5 Final   Mucus 10/05/2022 PRESENT   Final   Performed at [facility], [address], [place], [place] [address]   Specimen Description 10/05/2022 URINE, CLEAN CATCH   Final   Special  Requests 10/05/2022    Final                   Value:NONE Performed at [facility], [address], [place], [place] [address]    Culture 10/05/2022 10,000 COLONIES/mL MULTIPLE SPECIES PRESENT, SUGGEST RECOLLECTION (A)   Final   Report Status 10/05/2022 10/07/2022 FINAL   Final  Admission on 07/04/2022, Discharged on 07/05/2022  Component Date Value Ref Range Status   Sodium 07/04/2022 142  135 - 145 mmol/L Final   Potassium 07/04/2022 4.4  3.5 - 5.1 mmol/L Final   Chloride 07/04/2022 109  98 - 111 mmol/L Final   CO2 07/04/2022 26  22 - 32 mmol/L Final   Glucose, Bld 07/04/2022 96  70 - 99 mg/dL Final   Glucose reference range applies only to samples taken after fasting for at least 8 hours.   BUN 07/04/2022 15  6 - 20 mg/dL Final   Creatinine, Ser 07/04/2022 0.83  0.44 - 1.00 mg/dL Final   Calcium 60/45/4098 9.3  8.9 - 10.3 mg/dL Final   Total Protein 11/91/4782 7.2  6.5 - 8.1 g/dL Final   Albumin 95/62/1308 3.9  3.5 - 5.0 g/dL Final   AST 65/78/4696 23  15 - 41 U/L Final   ALT 07/04/2022 28  0 - 44 U/L Final   Alkaline Phosphatase 07/04/2022 77  38 - 126 U/L Final   Total Bilirubin 07/04/2022 0.4  0.3 - 1.2 mg/dL Final   GFR, Estimated 07/04/2022 >60  >60  mL/min Final   Comment: (NOTE) Calculated using the CKD-EPI Creatinine Equation (2021)    Anion gap 07/04/2022 7  5 - 15 Final   Performed at [facility], [address] [place], [place] [address]   Alcohol, Ethyl (B) 07/04/2022 <10  <10 mg/dL Final   Comment: (NOTE) Lowest detectable limit for serum alcohol is 10 mg/dL.  For medical purposes only. Performed at [facility], [address] [place], [place] [address]    WBC 07/04/2022 7.0  4.0 - 10.5 K/uL Final   RBC 07/04/2022 4.18  3.87 - 5.11 MIL/uL Final   Hemoglobin 07/04/2022 12.8  12.0 - 15.0 g/dL Final   HCT 40/07/2724 39.1  36.0 - 46.0 % Final   MCV 07/04/2022 93.5  80.0 - 100.0 fL Final   MCH 07/04/2022 30.6  26.0 - 34.0 pg Final   MCHC 07/04/2022 32.7  30.0 - 36.0 g/dL Final   RDW 36/64/4034 12.9  11.5 - 15.5 % Final   Platelets 07/04/2022 243  150 - 400 K/uL Final   nRBC 07/04/2022 0.0  0.0 - 0.2 % Final   Neutrophils Relative % 07/04/2022 43  % Final   Neutro Abs 07/04/2022 3.0  1.7 - 7.7 K/uL Final   Lymphocytes Relative 07/04/2022 50  % Final   Lymphs Abs 07/04/2022 3.5  0.7 - 4.0 K/uL Final   Monocytes Relative 07/04/2022 7  % Final   Monocytes Absolute 07/04/2022 0.5  0.1 - 1.0 K/uL Final   Eosinophils Relative 07/04/2022 0  % Final   Eosinophils Absolute 07/04/2022 0.0  0.0 - 0.5 K/uL Final   Basophils Relative 07/04/2022 0  % Final   Basophils Absolute 07/04/2022 0.0  0.0 - 0.1 K/uL Final   Immature Granulocytes 07/04/2022 0  % Final   Abs Immature Granulocytes 07/04/2022 0.01  0.00 - 0.07 K/uL Final   Performed at [facility], [address], [place], [place] [address]  I-stat hCG, quantitative 07/04/2022 <5.0  <5 mIU/mL Final   Comment 3 07/04/2022          Final   Comment:   GEST. AGE      CONC.  (mIU/mL)   <=1 WEEK        5 - 50     2 WEEKS       50 - 500     3 WEEKS       100 - 10,000     4 WEEKS     1,000 - 30,000        FEMALE AND  NON-PREGNANT FEMALE:     LESS THAN 5 mIU/mL   Admission on 06/14/2022, Discharged on 06/14/2022  Component Date Value Ref Range Status   Color, UA 06/14/2022 yellow  yellow Final   Clarity, UA 06/14/2022 cloudy (A)  clear Final   Glucose, UA 06/14/2022 negative  negative mg/dL Final   Bilirubin, UA 16/10/960409/10/2021 negative  negative Final   Ketones, POC UA 06/14/2022 negative  negative mg/dL Final   Spec Grav, UA 54/09/811909/10/2021 1.025  1.010 - 1.025 Final   Blood, UA 06/14/2022 negative  negative Final   pH, UA 06/14/2022 7.5  5.0 - 8.0 Final   Protein Ur, POC 06/14/2022 =30 (A)  negative mg/dL Final   Urobilinogen, UA 06/14/2022 0.2  0.2 or 1.0 E.U./dL Final   Nitrite, UA 14/78/295609/10/2021 Negative  Negative Final   Leukocytes, UA 06/14/2022 Negative  Negative Final   Preg Test, Ur 06/14/2022 Negative  Negative Final   Specimen Description 06/14/2022 URINE, CLEAN CATCH   Final   Special Requests 06/14/2022    Final                   Value:NONE Performed at [facility], [address] [place], [place] [address]    Culture 06/14/2022 MULTIPLE SPECIES PRESENT, SUGGEST RECOLLECTION (A)   Final   Report Status 06/14/2022 06/16/2022 FINAL   Final  Admission on 06/07/2022, Discharged on 06/10/2022  Component Date Value Ref Range Status   SARS Coronavirus 2 by RT PCR 06/07/2022 NEGATIVE  NEGATIVE Final   Comment: (NOTE) SARS-CoV-2 target nucleic acids are NOT DETECTED.  The SARS-CoV-2 RNA is generally detectable in upper respiratory specimens during the acute phase of infection. The lowest concentration of SARS-CoV-2 viral copies this assay can detect is 138 copies/mL. A negative result does not preclude SARS-Cov-2 infection and should not be used as the sole basis for treatment or other patient management decisions. A negative result may occur with  improper specimen collection/handling, submission of specimen other than nasopharyngeal swab, presence of viral mutation(s) within the areas  targeted by this assay, and inadequate number of viral copies(<138 copies/mL). A negative result must be combined with clinical observations, patient history, and epidemiological information. The expected result is Negative.  Fact Sheet for Patients:  BloggerCourse.comhttps://www.fda.gov/media/152166/download  Fact Sheet for Healthcare Providers:  SeriousBroker.ithttps://www.fda.gov/media/152162/download  This test is no                          t yet approved or cleared by the [place] FDA and  has been authorized for detection and/or diagnosis of SARS-CoV-2 by FDA under an Emergency Use Authorization (EUA). This EUA will remain  in effect (meaning this test can be used) for the duration of the COVID-19 declaration under Section 564(b)(1) of the Act, 21 U.S.C.section 360bbb-3(b)(1), unless the authorization is terminated  or revoked sooner.  Influenza A by PCR 06/07/2022 NEGATIVE  NEGATIVE Final   Influenza B by PCR 06/07/2022 NEGATIVE  NEGATIVE Final   Comment: (NOTE) The Xpert Xpress SARS-CoV-2/FLU/RSV plus assay is intended as an aid in the diagnosis of influenza from Nasopharyngeal swab specimens and should not be used as a sole basis for treatment. Nasal washings and aspirates are unacceptable for Xpert Xpress SARS-CoV-2/FLU/RSV testing.  Fact Sheet for Patients: BloggerCourse.com  Fact Sheet for Healthcare Providers: SeriousBroker.it  This test is not yet approved or cleared by the [place] FDA and has been authorized for detection and/or diagnosis of SARS-CoV-2 by FDA under an Emergency Use Authorization (EUA). This EUA will remain in effect (meaning this test can be used) for the duration of the COVID-19 declaration under Section 564(b)(1) of the Act, 21 U.S.C. section 360bbb-3(b)(1), unless the authorization is terminated or revoked.  Performed at [facility], [address] [place], [place] [address]    WBC 06/07/2022  5.7  4.0 - 10.5 K/uL Final   RBC 06/07/2022 4.39  3.87 - 5.11 MIL/uL Final   Hemoglobin 06/07/2022 13.2  12.0 - 15.0 g/dL Final   HCT 46/56/8127 40.4  36.0 - 46.0 % Final   MCV 06/07/2022 92.0  80.0 - 100.0 fL Final   MCH 06/07/2022 30.1  26.0 - 34.0 pg Final   MCHC 06/07/2022 32.7  30.0 - 36.0 g/dL Final   RDW 51/70/0174 12.4  11.5 - 15.5 % Final   Platelets 06/07/2022 308  150 - 400 K/uL Final   nRBC 06/07/2022 0.0  0.0 - 0.2 % Final   Neutrophils Relative % 06/07/2022 42  % Final   Neutro Abs 06/07/2022 2.4  1.7 - 7.7 K/uL Final   Lymphocytes Relative 06/07/2022 54  % Final   Lymphs Abs 06/07/2022 3.1  0.7 - 4.0 K/uL Final   Monocytes Relative 06/07/2022 4  % Final   Monocytes Absolute 06/07/2022 0.2  0.1 - 1.0 K/uL Final   Eosinophils Relative 06/07/2022 0  % Final   Eosinophils Absolute 06/07/2022 0.0  0.0 - 0.5 K/uL Final   Basophils Relative 06/07/2022 0  % Final   Basophils Absolute 06/07/2022 0.0  0.0 - 0.1 K/uL Final   Immature Granulocytes 06/07/2022 0  % Final   Abs Immature Granulocytes 06/07/2022 0.01  0.00 - 0.07 K/uL Final   Performed at [facility], [address], [place], [place] [address]   Sodium 06/07/2022 139  135 - 145 mmol/L Final   Potassium 06/07/2022 4.0  3.5 - 5.1 mmol/L Final   Chloride 06/07/2022 104  98 - 111 mmol/L Final   CO2 06/07/2022 28  22 - 32 mmol/L Final   Glucose, Bld 06/07/2022 104 (H)  70 - 99 mg/dL Final   Glucose reference range applies only to samples taken after fasting for at least 8 hours.   BUN 06/07/2022 8  6 - 20 mg/dL Final   Creatinine, Ser 06/07/2022 0.84  0.44 - 1.00 mg/dL Final   Calcium 75/91/6384 9.1  8.9 - 10.3 mg/dL Final   Total Protein 66/59/9357 6.9  6.5 - 8.1 g/dL Final   Albumin 01/77/9390 3.7  3.5 - 5.0 g/dL Final   AST 30/06/2329 19  15 - 41 U/L Final   ALT 06/07/2022 22  0 - 44 U/L Final   Alkaline Phosphatase 06/07/2022 54  38 - 126 U/L Final   Total Bilirubin 06/07/2022 0.4  0.3 - 1.2 mg/dL Final    GFR, Estimated 06/07/2022 >60  >  60 mL/min Final   Comment: (NOTE) Calculated using the CKD-EPI Creatinine Equation (2021)    Anion gap 06/07/2022 7  5 - 15 Final   Performed at [facility], [address], [place], [place] [address]   Hgb A1c MFr Bld 06/07/2022 5.0  4.8 - 5.6 % Final   Comment: (NOTE) Pre diabetes:          5.7%-6.4%  Diabetes:              >6.4%  Glycemic control for   <7.0% adults with diabetes    Mean Plasma Glucose 06/07/2022 96.8  mg/dL Final   Performed at [facility], [address], [place], [place] [address]   TSH 06/07/2022 1.620  0.350 - 4.500 uIU/mL Final   Comment: Performed by a 3rd Generation assay with a functional sensitivity of <=0.01 uIU/mL. Performed at [facility], [address], [place], [place] [address]    RPR Ser Ql 06/07/2022 NON REACTIVE  NON REACTIVE Final   Performed at [facility], [address] [place], [place] [address]   Color, Urine 06/07/2022 YELLOW  YELLOW Final   APPearance 06/07/2022 HAZY (A)  CLEAR Final   Specific Gravity, Urine 06/07/2022 1.018  1.005 - 1.030 Final   pH 06/07/2022 7.0  5.0 - 8.0 Final   Glucose, UA 06/07/2022 NEGATIVE  NEGATIVE mg/dL Final   Hgb urine dipstick 06/07/2022 NEGATIVE  NEGATIVE Final   Bilirubin Urine 06/07/2022 NEGATIVE  NEGATIVE Final   Ketones, ur 06/07/2022 NEGATIVE  NEGATIVE mg/dL Final   Protein, ur 29/56/2130 NEGATIVE  NEGATIVE mg/dL Final   Nitrite 86/57/8469 NEGATIVE  NEGATIVE Final   Leukocytes,Ua 06/07/2022 NEGATIVE  NEGATIVE Final   Performed at [facility], [address] [place], [place] [address]   Cholesterol 06/07/2022 164  0 - 200 mg/dL Final   Triglycerides 84/13/2440 158 (H)  <150 mg/dL Final   HDL 08/10/2535 44  >40 mg/dL Final   Total CHOL/HDL Ratio 06/07/2022 3.7  RATIO Final   VLDL 06/07/2022 32  0 - 40 mg/dL Final   LDL Cholesterol 06/07/2022 88  0 - 99 mg/dL Final   Comment:        Total Cholesterol/HDL:CHD Risk Coronary  Heart Disease Risk Table                     Men   Women  1/2 Average Risk   3.4   3.3  Average Risk       5.0   4.4  2 X Average Risk   9.6   7.1  3 X Average Risk  23.4   11.0        Use the calculated Patient Ratio above and the CHD Risk Table to determine the patient's CHD Risk.        ATP III CLASSIFICATION (LDL):  <100     mg/dL   Optimal  644-034  mg/dL   Near or Above                    Optimal  130-159  mg/dL   Borderline  742-595  mg/dL   High  >638     mg/dL   Very High Performed at [facility], [address], [place], [place] [address]    HIV Screen 4th Generation wRfx 06/07/2022 Non Reactive  Non Reactive Final   Performed at  [facility], [address],  [place], [place] [address]   SARSCOV2ONAVIRUS 2 AG 06/07/2022 NEGATIVE  NEGATIVE Final   Comment: (NOTE) SARS-CoV-2 antigen NOT DETECTED.   Negative results are presumptive.  Negative results do not preclude SARS-CoV-2 infection and should not be used as the sole basis for treatment or other patient management decisions, including infection  control decisions, particularly in the presence of clinical signs and  symptoms consistent with COVID-19, or in those who have been in contact with the virus.  Negative results must be combined with clinical observations, patient history, and epidemiological information. The expected result is Negative.  Fact Sheet for Patients: HandmadeRecipes.com.cy  Fact Sheet for Healthcare Providers: FuneralLife.at  This test is not yet approved or cleared by the [place] FDA and  has been authorized for detection and/or diagnosis of SARS-CoV-2 by FDA under an Emergency Use Authorization (EUA).  This EUA will remain in effect (meaning this test can be used) for the duration of  the COV                          ID-19 declaration under Section 564(b)(1) of the Act, 21 U.S.C. section 360bbb-3(b)(1), unless the  authorization is terminated or revoked sooner.     POC Amphetamine UR 06/07/2022 None Detected  NONE DETECTED (Cut Off Level 1000 ng/mL) Final   POC Secobarbital (BAR) 06/07/2022 None Detected  NONE DETECTED (Cut Off Level 300 ng/mL) Final   POC Buprenorphine (BUP) 06/07/2022 None Detected  NONE DETECTED (Cut Off Level 10 ng/mL) Final   POC Oxazepam (BZO) 06/07/2022 None Detected  NONE DETECTED (Cut Off Level 300 ng/mL) Final   POC Cocaine UR 06/07/2022 None Detected  NONE DETECTED (Cut Off Level 300 ng/mL) Final   POC Methamphetamine UR 06/07/2022 None Detected  NONE DETECTED (Cut Off Level 1000 ng/mL) Final   POC Morphine 06/07/2022 None Detected  NONE DETECTED (Cut Off Level 300 ng/mL) Final   POC Methadone UR 06/07/2022 None Detected  NONE DETECTED (Cut Off Level 300 ng/mL) Final   POC Oxycodone UR 06/07/2022 None Detected  NONE DETECTED (Cut Off Level 100 ng/mL) Final   POC Marijuana UR 06/07/2022 None Detected  NONE DETECTED (Cut Off Level 50 ng/mL) Final   Preg Test, Ur 06/08/2022 NEGATIVE  NEGATIVE Final   Comment:        THE SENSITIVITY OF THIS METHODOLOGY IS >20 mIU/mL. Performed at [facility], [address], [place], [place] [address]    Preg Test, Ur 06/08/2022 NEGATIVE  NEGATIVE Final   Comment:        THE SENSITIVITY OF THIS METHODOLOGY IS >24 mIU/mL     Blood Alcohol level:  Lab Results  Component Value Date   ETH <10 07/04/2022   ETH <10 28/31/5176    Metabolic Disorder Labs: Lab Results  Component Value Date   HGBA1C 5.7 (H) 10/15/2022   MPG 117 10/15/2022   MPG 96.8 06/07/2022   No results found for: "PROLACTIN" Lab Results  Component Value Date   CHOL 181 07/25/2022   TRIG 118 07/25/2022   HDL 45 07/25/2022   CHOLHDL 4.0 07/25/2022   VLDL 24 07/25/2022   LDLCALC 112 (H) 07/25/2022   LDLCALC 88 06/07/2022    Therapeutic Lab Levels: No results found for: "LITHIUM" No results found for: "VALPROATE" No results found for:  "CBMZ"  Physical Findings   GAD-7    Flowsheet Row Office Visit from 12/17/2021 in [facility] [place] [facility] [place]  Total GAD-7 Score 17      PHQ2-9    Flowsheet Row [facility] from [facility]/07[facility]20[facility] Office [facility]  PHQ-2 Total Score 0 1 2  PHQ-9 Total Score -- 2 8      Flowsheet Row ED from 10/07/2022 in [facility] Most recent reading at 11/07/2022  9:32 AM ED from 10/07/2022 in [facility] Most recent reading at 10/07/2022 12:18 PM ED from 07/25/2022 in [facility] Most recent reading at 10/06/2022  9:38 AM  C-SSRS RISK CATEGORY No Risk No Risk No Risk        Musculoskeletal  Strength & Muscle Tone: within normal limits Gait & Station: normal Patient leans: N/A  Psychiatric Specialty Exam  Presentation  General Appearance:  Appropriate for Environment  Eye Contact: Good  Speech: Clear and Coherent  Speech Volume: Normal  Handedness: Right   Mood and Affect  Mood: Euthymic  Affect: Appropriate   Thought Process  Thought Processes: Coherent  Descriptions of Associations:Intact  Orientation:Full (Time, Place and Person)  Thought Content:Logical  Diagnosis of Schizophrenia or Schizoaffective disorder in past: Yes  Duration of Psychotic Symptoms: Greater than six months   Hallucinations:Hallucinations: None  Ideas of Reference:None  Suicidal Thoughts:Suicidal Thoughts: No  Homicidal Thoughts:Homicidal Thoughts: No   Sensorium  Memory: Immediate Good  Judgment: Good  Insight: Good   Executive Functions  Concentration: Good  Attention Span: Good  Recall: Good  Fund of Knowledge: Good  Language: Good   Psychomotor Activity  Psychomotor  Activity: Psychomotor Activity: Normal   Assets  Assets: Desire for Improvement   Sleep  Sleep: Sleep: Good   Nutritional Assessment (For OBS and [facility] admissions only) Has the patient had a weight loss or gain of 10 pounds or more in the last 3 months?: No Has the patient had a decrease in food intake/or appetite?: No Does the patient have dental problems?: No Does the patient have eating habits or behaviors that may be indicators of an eating disorder including binging or inducing vomiting?: No Has the patient recently lost weight without trying?: 0 Has the patient been eating poorly because of a decreased appetite?: 0 Malnutrition Screening Tool Score: 0    Physical Exam  Physical Exam Constitutional:      Appearance: Normal appearance. She is normal weight.  HENT:     Head: Normocephalic.  Cardiovascular:     Rate and Rhythm: Tachycardia present.  Pulmonary:     Effort: Pulmonary effort is normal.  Musculoskeletal:     Cervical back: Normal range of motion.  Neurological:     Mental Status: She is alert.  Psychiatric:        Attention and Perception: Attention normal.        Mood and Affect: Mood and affect normal.        Speech: Speech normal.        Behavior: Behavior normal. Behavior is cooperative.        Thought Content: Thought content normal.        Cognition and Memory: Cognition and memory normal.        Judgment: Judgment normal.    Review of Systems  Constitutional: Negative.  Negative for diaphoresis and fever.  HENT: Negative.  Negative for hearing loss.   Eyes:  Negative for discharge and  redness.  Respiratory: Negative.  Negative for cough and shortness of breath.   Cardiovascular:  Negative for chest pain.  Musculoskeletal: Negative.   Neurological: Negative.   Psychiatric/Behavioral:  Negative for depression, hallucinations and suicidal ideas. The patient does not have insomnia.    Blood pressure 110/76, pulse (!) 101, temperature 98.3 F  (36.8 C), temperature source Oral, resp. rate 16, height 5\' 1"  (1.549 m), weight 89.8 kg, SpO2 96 %. Body mass index is 37.41 kg/m.  Treatment Plan Summary: Pt remains psychiatrically cleared.  TOC/DSS is seeking placement.  Will remain boarding on the unit.  Patient seen face to face by this and Student, Clinical research associate, RN  Reola Calkins NP  11/11/2022 11:34 AM

## 2022-11-11 NOTE — Care Management (Signed)
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   Per Nicole [name], [facility] will be able to complete a psychological evaluation on Monday November 18, 2022 at 5:45pm virtually.   Nicole [name] with the group home is still in the process of completing [place] [facility] and having screening.  The legal guardian will email a copy of the updated guardianship paperwork which lists Nicole [name] as the new legal guardian.   Nicole [name] reports that she has been in contact with [facility] independent living program and she has a phone interview on 11-11-2022 at 3pm with Nicole [name].  Writer send an email to [place] requesting that the phone interview can be virtual sos that the IDD Care Coordinator and the DSS Case Manager can participate.    Nicole [name] reports that her father the legal guardian was in agreement with her living independently.  The father was had to get off the WebEx before we were able to confirm that he is in agreement with her going into an independent living facility.   The next Treatment Team meeting will be on 11-20-2022 (Wednesday) at 1pm

## 2022-11-11 NOTE — ED Notes (Signed)
Pt sleeping in recliner bed. RR even and unlabored. Monitoring for safety continues 

## 2022-11-12 DIAGNOSIS — F419 Anxiety disorder, unspecified: Secondary | ICD-10-CM | POA: Diagnosis not present

## 2022-11-12 DIAGNOSIS — F333 Major depressive disorder, recurrent, severe with psychotic symptoms: Secondary | ICD-10-CM | POA: Diagnosis not present

## 2022-11-12 DIAGNOSIS — E039 Hypothyroidism, unspecified: Secondary | ICD-10-CM | POA: Diagnosis not present

## 2022-11-12 DIAGNOSIS — Z1152 Encounter for screening for COVID-19: Secondary | ICD-10-CM | POA: Diagnosis not present

## 2022-11-12 DIAGNOSIS — F17200 Nicotine dependence, unspecified, uncomplicated: Secondary | ICD-10-CM | POA: Diagnosis not present

## 2022-11-12 DIAGNOSIS — N390 Urinary tract infection, site not specified: Secondary | ICD-10-CM | POA: Diagnosis not present

## 2022-11-12 DIAGNOSIS — N9489 Other specified conditions associated with female genital organs and menstrual cycle: Secondary | ICD-10-CM | POA: Diagnosis not present

## 2022-11-12 DIAGNOSIS — E118 Type 2 diabetes mellitus with unspecified complications: Secondary | ICD-10-CM | POA: Diagnosis not present

## 2022-11-12 DIAGNOSIS — F209 Schizophrenia, unspecified: Secondary | ICD-10-CM | POA: Diagnosis not present

## 2022-11-12 MED ORDER — FLUCONAZOLE 50 MG PO TABS
150.0000 mg | ORAL_TABLET | Freq: Once | ORAL | Status: AC
  Administered 2022-11-12: 150 mg via ORAL
  Filled 2022-11-12: qty 3

## 2022-11-12 NOTE — Care Management (Addendum)
OBS Care Management     Meeting with [facility] regarding independent living In attendance [name] - Patient  [facility]erc[facility] - IDD [place]  [place] Coordinator  [name] Coordinator   Per Berlinda Last with [facility], the patient was previously approved for independent living but was not able to be placed due to the (legal mother - legal guardian) was not able to be located in order to sign the paperwork for placement.   Per [name] her father has agreed for the patient to be considered for placement in an independent living facility.  Writer informed the patient that the Care Coordinator with [facility] ([facility]) would need to hear from new legal guardian (foster father - Marcy Salvo).  Per Colletta Maryland with [facility], if the patient has a diagnosis of IDD then there is an immediate opening for placement near [facility]; otherwise, she would have to wait 3 months for an opening in [place].  Bionca reports that she wants to stay with the initial placement with [facility] and wait for independent living.  However, she will continue to have her name on the list for placement at a later point in time.

## 2022-11-12 NOTE — ED Notes (Signed)
Pt informed this nurse that she feels she is developing a yeast infection. She stated her symptoms began yesterday. Informed provider via secure chat. Patient accepted scheduled meds w/o difficulty. Safety maintained and will continue to monitor.

## 2022-11-12 NOTE — ED Provider Notes (Signed)
Behavioral Health Progress Note  Date and Time: 11/12/2022 8:42 AM Name: Nicole [name] MRN:  [id number]  Nicole [name] is a 32 y/o female with PMH of SCZ unspecified, MDD with psychosis, who presented voluntarily to [facility] (07/23/2022) via [facility] after a verbal altercation with [name] (not patient's legal guardian) for transient SI. This is patient's fourth visit to [facility] for similar concerns this year. Patient has been dismissed from previous group home due to threatening SI and HI, then leaving the group home. 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  Subjective:    Pt seen on the unit, sleeping on my approach. She awakens easily to name being called.She endorses euthymic mood. She states she is interested in independent living, has possible placement at [facility]. She denies suicidal, homicidal or violent ideations. She denies auditory visual hallucinations or paranoia. She reports she needs an updated psychological evaluation next week. She denies any psychiatric or medical complaints. Pt continues to remain psychiatrically stable. Disposition is pending placement.  Diagnosis:  Final diagnoses:  Tobacco use disorder  Schizophrenia, unspecified type ([place])  MDD (major depressive disorder), recurrent episode, moderate ([place])  Hypothyroidism, unspecified type    Total Time spent with patient: 15 minutes  Past Psychiatric History: SCZ unspecified, MDD with psychosis, anxiety, depression, tobacco use Past Medical History: Hypothyroidism Family History: Hypertension - father diabetes Family Psychiatric  History: None reported Social History: Currently seeking placement  Additional Social History:                         Sleep: Good  Appetite:  Good  Current Medications:  Current Facility-Administered Medications  Medication Dose Route Frequency Provider Last Rate Last Admin   acetaminophen (TYLENOL) tablet 650 mg  650 mg Oral Q6H PRN Haynes Kerns, NP   650 mg at 11/04/22 2154   alum & mag hydroxide-simeth (MAALOX/MYLANTA) 200-200-20 MG/5ML suspension 30 mL  30 mL Oral Q4H PRN Haynes Kerns, NP   30 mL at 11/11/22 0052   ARIPiprazole ER (ABILIFY MAINTENA) 400 MG prefilled syringe 400 mg  400 mg Intramuscular Q28 days Merrily Brittle, DO   400 mg at 10/25/22 0920   cetaphil lotion   Topical PRN Merrily Brittle, DO       fluticasone (FLONASE) 50 MCG/ACT nasal spray 1 spray  1 spray Each Nare QHS Tharon Aquas, NP   1 spray at 11/11/22 2119   hydrOXYzine (ATARAX) tablet 25 mg  25 mg Oral TID PRN Christene Slates, MD   25 mg at 11/11/22 2120   ketoconazole (NIZORAL) 2 % cream   Topical BID Merrily Brittle, DO   Given at 11/11/22 2119   levothyroxine (SYNTHROID) tablet 100 mcg  100 mcg Oral Once Byungura, Veronique M, NP       levothyroxine (SYNTHROID) tablet 100 mcg  100 mcg Oral Q0600 Haynes Kerns, NP   100 mcg at 11/12/22 0517   lidocaine (LIDODERM) 5 % 1 patch  1 patch Transdermal Q24H Merrily Brittle, DO   1 patch at 11/11/22 1000   loratadine (CLARITIN) tablet 10 mg  10 mg Oral Daily Carrion-Carrero, Margely, MD   10 mg at 11/11/22 1001   magnesium hydroxide (MILK OF MAGNESIA) suspension 30 mL  30 mL Oral Daily PRN Haynes Kerns, NP   30 mL at 10/12/22 2145   metFORMIN (GLUCOPHAGE) tablet 500 mg  500 mg Oral Q breakfast Christene Slates, MD   500 mg at 11/11/22  1002   nicotine (NICODERM CQ - dosed in mg/24 hours) patch 14 mg  14 mg Transdermal Daily PRN Carrion-Carrero, Margely, MD       ondansetron (ZOFRAN-ODT) disintegrating tablet 4 mg  4 mg Oral Q8H PRN Carrion-Carrero, Margely, MD   4 mg at 11/01/22 2246   Oxcarbazepine (TRILEPTAL) tablet 300 mg  300 mg Oral BID Byungura, Veronique M, NP   300 mg at 11/11/22 2119   pantoprazole (PROTONIX) EC tablet 40 mg  40 mg Oral Daily Carrion-Carrero, Margely, MD   40 mg at 11/11/22 1001   polyethylene glycol (MIRALAX / GLYCOLAX) packet 17 g  17 g  Oral Daily Princess Bruins, DO   17 g at 11/11/22 1001   QUEtiapine (SEROQUEL) tablet 400 mg  400 mg Oral BID Byungura, Veronique M, NP   400 mg at 11/11/22 2119   sertraline (ZOLOFT) tablet 150 mg  150 mg Oral Daily Carrion-Carrero, Margely, MD   150 mg at 11/11/22 1001   sodium chloride (OCEAN) 0.65 % nasal spray 1 spray  1 spray Each Nare Daily Princess Bruins, DO   1 spray at 11/11/22 1000   traZODone (DESYREL) tablet 100 mg  100 mg Oral QHS Olin Pia M, NP   100 mg at 11/11/22 2120   traZODone (DESYREL) tablet 50 mg  50 mg Oral QHS PRN Marlou Sa, NP   50 mg at 11/11/22 2120   valACYclovir (VALTREX) tablet 500 mg  500 mg Oral Daily Carrion-Carrero, Margely, MD   500 mg at 11/11/22 1001   zinc oxide (BALMEX) 11.3 % cream   Topical PRN Princess Bruins, DO       Current Outpatient Medications  Medication Sig Dispense Refill   ABILIFY MAINTENA 400 MG PRSY prefilled syringe 400 mg every 28 (twenty-eight) days.     cetirizine (ZYRTEC) 10 MG tablet Take 10 mg by mouth daily.     cyclobenzaprine (FLEXERIL) 10 MG tablet Take 1 tablet (10 mg total) by mouth 2 (two) times daily as needed for muscle spasms. 20 tablet 0   fluticasone (FLONASE) 50 MCG/ACT nasal spray Place 1 spray into both nostrils daily.     meloxicam (MOBIC) 15 MG tablet Take 15 mg by mouth daily.     nitrofurantoin, macrocrystal-monohydrate, (MACROBID) 100 MG capsule Take 1 capsule (100 mg total) by mouth 2 (two) times daily. 10 capsule 0   ondansetron (ZOFRAN-ODT) 4 MG disintegrating tablet Take 1 tablet (4 mg total) by mouth every 8 (eight) hours as needed for nausea or vomiting. 20 tablet 0   Oxcarbazepine (TRILEPTAL) 300 MG tablet Take 1 tablet (300 mg total) by mouth 2 (two) times daily. 60 tablet 0   QUEtiapine (SEROQUEL) 400 MG tablet Take 1 tablet (400 mg total) by mouth 2 (two) times daily. 60 tablet 0   sertraline (ZOLOFT) 50 MG tablet Take 3 tablets (150 mg total) by mouth in the morning. 90 tablet 0    traZODone (DESYREL) 100 MG tablet Take 1 tablet (100 mg total) by mouth at bedtime. 30 tablet 0   valACYclovir (VALTREX) 500 MG tablet Take 500 mg by mouth daily.      Labs  Lab Results:  Admission on 10/07/2022  Component Date Value Ref Range Status   Hgb A1c MFr Bld 10/15/2022 5.7 (H)  4.8 - 5.6 % Final   Comment: (NOTE)         Prediabetes: 5.7 - 6.4         Diabetes: >6.4  Glycemic control for adults with diabetes: <7.0    Mean Plasma Glucose 10/15/2022 117  mg/dL Final   Comment: (NOTE) Performed At: [facility] [place], [place] [id number] Rush Farmer MD RJ:[id number]   Office Visit on 10/21/2022  Component Date Value Ref Range Status   SARSCOV2ONAVIRUS 2 AG 11/01/2022 NEGATIVE  NEGATIVE Final   Comment: (NOTE) SARS-CoV-2 antigen NOT DETECTED.   Negative results are presumptive.  Negative results do not preclude SARS-CoV-2 infection and should not be used as the sole basis for treatment or other patient management decisions, including infection  control decisions, particularly in the presence of clinical signs and  symptoms consistent with COVID-19, or in those who have been in contact with the virus.  Negative results must be combined with clinical observations, patient history, and epidemiological information. The expected result is Negative.  Fact Sheet for Patients: HandmadeRecipes.com.cy  Fact Sheet for Healthcare Providers: FuneralLife.at  This test is not yet approved or cleared by the [place] FDA and  has been authorized for detection and/or diagnosis of SARS-CoV-2 by FDA under an Emergency Use Authorization (EUA).  This EUA will remain in effect (meaning this test can be used) for the duration of  the COV                          ID-19 declaration under Section 564(b)(1) of the Act, 21 U.S.C. section 360bbb-3(b)(1), unless the authorization is terminated or revoked  sooner.    Admission on 10/07/2022, Discharged on 10/07/2022  Component Date Value Ref Range Status   Color, Urine 10/07/2022 YELLOW  YELLOW Final   APPearance 10/07/2022 HAZY (A)  CLEAR Final   Specific Gravity, Urine 10/07/2022 1.011  1.005 - 1.030 Final   pH 10/07/2022 7.0  5.0 - 8.0 Final   Glucose, UA 10/07/2022 NEGATIVE  NEGATIVE mg/dL Final   Hgb urine dipstick 10/07/2022 SMALL (A)  NEGATIVE Final   Bilirubin Urine 10/07/2022 NEGATIVE  NEGATIVE Final   Ketones, ur 10/07/2022 NEGATIVE  NEGATIVE mg/dL Final   Protein, ur 10/07/2022 NEGATIVE  NEGATIVE mg/dL Final   Nitrite 10/07/2022 NEGATIVE  NEGATIVE Final   Leukocytes,Ua 10/07/2022 LARGE (A)  NEGATIVE Final   RBC / HPF 10/07/2022 0-5  0 - 5 RBC/hpf Final   WBC, UA 10/07/2022 0-5  0 - 5 WBC/hpf Final   Bacteria, UA 10/07/2022 FEW (A)  NONE SEEN Final   Squamous Epithelial / HPF 10/07/2022 11-20  0 - 5 Final   Performed at [facility], [place] [address], [place], [place] [address]   I-stat hCG, quantitative 10/07/2022 <5.0  <5 mIU/mL Final   Comment 3 10/07/2022          Final   Comment:   GEST. AGE      CONC.  (mIU/mL)   <=1 WEEK        5 - 50     2 WEEKS       50 - 500     3 WEEKS       100 - 10,000     4 WEEKS     1,000 - 30,000        FEMALE AND NON-PREGNANT FEMALE:     LESS THAN 5 mIU/mL    Sodium 10/07/2022 138  135 - 145 mmol/L Final   Potassium 10/07/2022 4.3  3.5 - 5.1 mmol/L Final   Chloride 10/07/2022 104  98 - 111 mmol/L Final   CO2 10/07/2022  28  22 - 32 mmol/L Final   Glucose, Bld 10/07/2022 90  70 - 99 mg/dL Final   Glucose reference range applies only to samples taken after fasting for at least 8 hours.   BUN 10/07/2022 8  6 - 20 mg/dL Final   Creatinine, Ser 10/07/2022 0.96  0.44 - 1.00 mg/dL Final   Calcium 10/07/2022 9.1  8.9 - 10.3 mg/dL Final   GFR, Estimated 10/07/2022 >60  >60 mL/min Final   Comment: (NOTE) Calculated using the CKD-EPI Creatinine Equation (2021)    Anion gap 10/07/2022 6  5 -  15 Final   Performed at [facility], [place] [address], [place], [place] [address]   WBC 10/07/2022 5.8  4.0 - 10.5 K/uL Final   RBC 10/07/2022 4.36  3.87 - 5.11 MIL/uL Final   Hemoglobin 10/07/2022 12.9  12.0 - 15.0 g/dL Final   HCT 10/07/2022 40.0  36.0 - 46.0 % Final   MCV 10/07/2022 91.7  80.0 - 100.0 fL Final   MCH 10/07/2022 29.6  26.0 - 34.0 pg Final   MCHC 10/07/2022 32.3  30.0 - 36.0 g/dL Final   RDW 10/07/2022 12.4  11.5 - 15.5 % Final   Platelets 10/07/2022 295  150 - 400 K/uL Final   nRBC 10/07/2022 0.0  0.0 - 0.2 % Final   Performed at [place] [address], [place], [place] [address]   SARS Coronavirus 2 by RT PCR 10/07/2022 NEGATIVE  NEGATIVE Final   Comment: (NOTE) SARS-CoV-2 target nucleic acids are NOT DETECTED.  The SARS-CoV-2 RNA is generally detectable in upper respiratory specimens during the acute phase of infection. The lowest concentration of SARS-CoV-2 viral copies this assay can detect is 138 copies/mL. A negative result does not preclude SARS-Cov-2 infection and should not be used as the sole basis for treatment or other patient management decisions. A negative result may occur with  improper specimen collection/handling, submission of specimen other than nasopharyngeal swab, presence of viral mutation(s) within the areas targeted by this assay, and inadequate number of viral copies(<138 copies/mL). A negative result must be combined with clinical observations, patient history, and epidemiological information. The expected result is Negative.  Fact Sheet for Patients:  EntrepreneurPulse.com.au  Fact Sheet for Healthcare Providers:  IncredibleEmployment.be  This test is no                          t yet approved or cleared by the [place] FDA and  has been authorized for detection and/or diagnosis of SARS-CoV-2 by FDA under an Emergency Use Authorization (EUA). This EUA will remain  in effect  (meaning this test can be used) for the duration of the COVID-19 declaration under Section 564(b)(1) of the Act, 21 U.S.C.section 360bbb-3(b)(1), unless the authorization is terminated  or revoked sooner.       Influenza A by PCR 10/07/2022 NEGATIVE  NEGATIVE Final   Influenza B by PCR 10/07/2022 NEGATIVE  NEGATIVE Final   Comment: (NOTE) The Xpert Xpress SARS-CoV-2/FLU/RSV plus assay is intended as an aid in the diagnosis of influenza from Nasopharyngeal swab specimens and should not be used as a sole basis for treatment. Nasal washings and aspirates are unacceptable for Xpert Xpress SARS-CoV-2/FLU/RSV testing.  Fact Sheet for Patients: EntrepreneurPulse.com.au  Fact Sheet for Healthcare Providers: IncredibleEmployment.be  This test is not yet approved or cleared by the [place] FDA and has been authorized for detection and/or diagnosis of SARS-CoV-2 by  FDA under an Emergency Use Authorization (EUA). This EUA will remain in effect (meaning this test can be used) for the duration of the COVID-19 declaration under Section 564(b)(1) of the Act, 21 U.S.C. section 360bbb-3(b)(1), unless the authorization is terminated or revoked.     Resp Syncytial Virus by PCR 10/07/2022 NEGATIVE  NEGATIVE Final   Comment: (NOTE) Fact Sheet for Patients: EntrepreneurPulse.com.au  Fact Sheet for Healthcare Providers: IncredibleEmployment.be  This test is not yet approved or cleared by the [place] FDA and has been authorized for detection and/or diagnosis of SARS-CoV-2 by FDA under an Emergency Use Authorization (EUA). This EUA will remain in effect (meaning this test can be used) for the duration of the COVID-19 declaration under Section 564(b)(1) of the Act, 21 U.S.C. section 360bbb-3(b)(1), unless the authorization is terminated or revoked.  Performed at [facility], [place] [address] [place],  [place] [address]    Specimen Description 10/07/2022 URINE, CLEAN CATCH   Final   Special Requests 10/07/2022    Final                   Value:NONE Performed at [facility], [place] [address], [place], [place] [address]    Culture 10/07/2022 MULTIPLE SPECIES PRESENT, SUGGEST RECOLLECTION (A)   Final   Report Status 10/07/2022 10/08/2022 FINAL   Final  Admission on 07/25/2022, Discharged on 10/07/2022  Component Date Value Ref Range Status   SARS Coronavirus 2 by RT PCR 07/25/2022 NEGATIVE  NEGATIVE Final   Comment: (NOTE) SARS-CoV-2 target nucleic acids are NOT DETECTED.  The SARS-CoV-2 RNA is generally detectable in upper respiratory specimens during the acute phase of infection. The lowest concentration of SARS-CoV-2 viral copies this assay can detect is 138 copies/mL. A negative result does not preclude SARS-Cov-2 infection and should not be used as the sole basis for treatment or other patient management decisions. A negative result may occur with  improper specimen collection/handling, submission of specimen other than nasopharyngeal swab, presence of viral mutation(s) within the areas targeted by this assay, and inadequate number of viral copies(<138 copies/mL). A negative result must be combined with clinical observations, patient history, and epidemiological information. The expected result is Negative.  Fact Sheet for Patients:  EntrepreneurPulse.com.au  Fact Sheet for Healthcare Providers:  IncredibleEmployment.be  This test is no                          t yet approved or cleared by the [place] FDA and  has been authorized for detection and/or diagnosis of SARS-CoV-2 by FDA under an Emergency Use Authorization (EUA). This EUA will remain  in effect (meaning this test can be used) for the duration of the COVID-19 declaration under Section 564(b)(1) of the Act, 21 U.S.C.section 360bbb-3(b)(1), unless the authorization is terminated   or revoked sooner.       Influenza A by PCR 07/25/2022 NEGATIVE  NEGATIVE Final   Influenza B by PCR 07/25/2022 NEGATIVE  NEGATIVE Final   Comment: (NOTE) The Xpert Xpress SARS-CoV-2/FLU/RSV plus assay is intended as an aid in the diagnosis of influenza from Nasopharyngeal swab specimens and should not be used as a sole basis for treatment. Nasal washings and aspirates are unacceptable for Xpert Xpress SARS-CoV-2/FLU/RSV testing.  Fact Sheet for Patients: EntrepreneurPulse.com.au  Fact Sheet for Healthcare Providers: IncredibleEmployment.be  This test is not yet approved or cleared by the [place] FDA and has been authorized for detection and/or diagnosis  of SARS-CoV-2 by FDA under an Emergency Use Authorization (EUA). This EUA will remain in effect (meaning this test can be used) for the duration of the COVID-19 declaration under Section 564(b)(1) of the Act, 21 U.S.C. section 360bbb-3(b)(1), unless the authorization is terminated or revoked.  Performed at [facility], [place] [address] [place], [place] [address]    WBC 07/25/2022 8.3  4.0 - 10.5 K/uL Final   RBC 07/25/2022 4.44  3.87 - 5.11 MIL/uL Final   Hemoglobin 07/25/2022 13.7  12.0 - 15.0 g/dL Final   HCT 07/25/2022 40.2  36.0 - 46.0 % Final   MCV 07/25/2022 90.5  80.0 - 100.0 fL Final   MCH 07/25/2022 30.9  26.0 - 34.0 pg Final   MCHC 07/25/2022 34.1  30.0 - 36.0 g/dL Final   RDW 07/25/2022 12.2  11.5 - 15.5 % Final   Platelets 07/25/2022 248  150 - 400 K/uL Final   nRBC 07/25/2022 0.0  0.0 - 0.2 % Final   Neutrophils Relative % 07/25/2022 43  % Final   Neutro Abs 07/25/2022 3.6  1.7 - 7.7 K/uL Final   Lymphocytes Relative 07/25/2022 52  % Final   Lymphs Abs 07/25/2022 4.2 (H)  0.7 - 4.0 K/uL Final   Monocytes Relative 07/25/2022 5  % Final   Monocytes Absolute 07/25/2022 0.4  0.1 - 1.0 K/uL Final   Eosinophils Relative 07/25/2022 0  % Final   Eosinophils Absolute  07/25/2022 0.0  0.0 - 0.5 K/uL Final   Basophils Relative 07/25/2022 0  % Final   Basophils Absolute 07/25/2022 0.0  0.0 - 0.1 K/uL Final   Immature Granulocytes 07/25/2022 0  % Final   Abs Immature Granulocytes 07/25/2022 0.02  0.00 - 0.07 K/uL Final   Performed at [facility], [place] [address] [place], [place] [address]   Sodium 07/25/2022 138  135 - 145 mmol/L Final   Potassium 07/25/2022 4.0  3.5 - 5.1 mmol/L Final   Chloride 07/25/2022 104  98 - 111 mmol/L Final   CO2 07/25/2022 29  22 - 32 mmol/L Final   Glucose, Bld 07/25/2022 83  70 - 99 mg/dL Final   Glucose reference range applies only to samples taken after fasting for at least 8 hours.   BUN 07/25/2022 11  6 - 20 mg/dL Final   Creatinine, Ser 07/25/2022 0.97  0.44 - 1.00 mg/dL Final   Calcium 07/25/2022 9.2  8.9 - 10.3 mg/dL Final   Total Protein 07/25/2022 7.0  6.5 - 8.1 g/dL Final   Albumin 07/25/2022 3.8  3.5 - 5.0 g/dL Final   AST 07/25/2022 18  15 - 41 U/L Final   ALT 07/25/2022 22  0 - 44 U/L Final   Alkaline Phosphatase 07/25/2022 64  38 - 126 U/L Final   Total Bilirubin 07/25/2022 0.2 (L)  0.3 - 1.2 mg/dL Final   GFR, Estimated 07/25/2022 >60  >60 mL/min Final   Comment: (NOTE) Calculated using the CKD-EPI Creatinine Equation (2021)    Anion gap 07/25/2022 5  5 - 15 Final   Performed at [facility] [address], [place], [place] [address]   POC Amphetamine UR 07/25/2022 None Detected  NONE DETECTED (Cut Off Level 1000 ng/mL) Preliminary   POC Secobarbital (BAR) 07/25/2022 None Detected  NONE DETECTED (Cut Off Level 300 ng/mL) Preliminary   POC Buprenorphine (BUP) 07/25/2022 None Detected  NONE DETECTED (Cut Off Level 10 ng/mL) Preliminary   POC Oxazepam (BZO) 07/25/2022 None Detected  NONE DETECTED (Cut Off Level 300 ng/mL) Preliminary   POC Cocaine UR 07/25/2022 None Detected  NONE DETECTED (Cut Off Level 300 ng/mL) Preliminary   POC Methamphetamine UR 07/25/2022 None Detected  NONE DETECTED (Cut  Off Level 1000 ng/mL) Preliminary   POC Morphine 07/25/2022 None Detected  NONE DETECTED (Cut Off Level 300 ng/mL) Preliminary   POC Methadone UR 07/25/2022 None Detected  NONE DETECTED (Cut Off Level 300 ng/mL) Preliminary   POC Oxycodone UR 07/25/2022 None Detected  NONE DETECTED (Cut Off Level 100 ng/mL) Preliminary   POC Marijuana UR 07/25/2022 None Detected  NONE DETECTED (Cut Off Level 50 ng/mL) Preliminary   SARSCOV2ONAVIRUS 2 AG 07/25/2022 NEGATIVE  NEGATIVE Final   Comment: (NOTE) SARS-CoV-2 antigen NOT DETECTED.   Negative results are presumptive.  Negative results do not preclude SARS-CoV-2 infection and should not be used as the sole basis for treatment or other patient management decisions, including infection  control decisions, particularly in the presence of clinical signs and  symptoms consistent with COVID-19, or in those who have been in contact with the virus.  Negative results must be combined with clinical observations, patient history, and epidemiological information. The expected result is Negative.  Fact Sheet for Patients: HandmadeRecipes.com.cy  Fact Sheet for Healthcare Providers: FuneralLife.at  This test is not yet approved or cleared by the [place] FDA and  has been authorized for detection and/or diagnosis of SARS-CoV-2 by FDA under an Emergency Use Authorization (EUA).  This EUA will remain in effect (meaning this test can be used) for the duration of  the COV                          ID-19 declaration under Section 564(b)(1) of the Act, 21 U.S.C. section 360bbb-3(b)(1), unless the authorization is terminated or revoked sooner.     Cholesterol 07/25/2022 181  0 - 200 mg/dL Final   Triglycerides 07/25/2022 118  <150 mg/dL Final   HDL 07/25/2022 45  >40 mg/dL Final   Total CHOL/HDL Ratio 07/25/2022 4.0  RATIO Final   VLDL 07/25/2022 24  0 - 40 mg/dL Final   LDL Cholesterol 07/25/2022 112 (H)  0 -  99 mg/dL Final   Comment:        Total Cholesterol/HDL:CHD Risk Coronary Heart Disease Risk Table                     Men   Women  1/2 Average Risk   3.4   3.3  Average Risk       5.0   4.4  2 X Average Risk   9.6   7.1  3 X Average Risk  23.4   11.0        Use the calculated Patient Ratio above and the CHD Risk Table to determine the patient's CHD Risk.        ATP III CLASSIFICATION (LDL):  <100     mg/dL   Optimal  100-129  mg/dL   Near or Above                    Optimal  130-159  mg/dL   Borderline  160-189  mg/dL   High  >190     mg/dL   Very High Performed at [facility] [address] [place], [place] [address]    TSH 07/25/2022 6.668 (H)  0.350 - 4.500 uIU/mL Final   Comment: Performed by a  3rd Generation assay with a functional sensitivity of <=0.01 uIU/mL. Performed at [facility], [address], [place], [place] [address]    Glucose-Capillary 07/26/2022 104 (H)  70 - 99 mg/dL Final   Glucose reference range applies only to samples taken after fasting for at least 8 hours.   T3, Free 07/31/2022 2.3  2.0 - 4.4 pg/mL Final   Comment: (NOTE) Performed At: [facility] [address] [place], [place] [id number] Jolene Schimke MD GE:[id number]    Free T4 07/31/2022 0.60 (L)  0.61 - 1.12 ng/dL Final   Comment: (NOTE) Biotin ingestion may interfere with free T4 tests. If the results are inconsistent with the TSH level, previous test results, or the clinical presentation, then consider biotin interference. If needed, order repeat testing after stopping biotin. Performed at [facility], [address], [place], [place] [address]    Glucose-Capillary 08/29/2022 100 (H)  70 - 99 mg/dL Final   Glucose reference range applies only to samples taken after fasting for at least 8 hours.   TSH 09/05/2022 0.793  0.350 - 4.500 uIU/mL Final   Comment: Performed by a 3rd Generation assay with a functional sensitivity of <=0.01 uIU/mL. Performed at  [facility], [address], [place], [place] [address]    Free T4 09/05/2022 0.73  0.61 - 1.12 ng/dL Final   Comment: (NOTE) Biotin ingestion may interfere with free T4 tests. If the results are inconsistent with the TSH level, previous test results, or the clinical presentation, then consider biotin interference. If needed, order repeat testing after stopping biotin. Performed at [facility], [address], [place], [place] [address]    Preg Test, Ur 09/06/2022 Negative  Negative Final   Preg Test, Ur 09/06/2022 NEGATIVE  NEGATIVE Final   Comment:        THE SENSITIVITY OF THIS METHODOLOGY IS >24 mIU/mL    Preg Test, Ur 09/05/2022 NEGATIVE  NEGATIVE Final   Comment:        THE SENSITIVITY OF THIS METHODOLOGY IS >24 mIU/mL    Sodium 09/13/2022 136  135 - 145 mmol/L Final   Potassium 09/13/2022 4.5  3.5 - 5.1 mmol/L Final   Chloride 09/13/2022 105  98 - 111 mmol/L Final   CO2 09/13/2022 21 (L)  22 - 32 mmol/L Final   Glucose, Bld 09/13/2022 111 (H)  70 - 99 mg/dL Final   Glucose reference range applies only to samples taken after fasting for at least 8 hours.   BUN 09/13/2022 14  6 - 20 mg/dL Final   Creatinine, Ser 09/13/2022 0.92  0.44 - 1.00 mg/dL Final   Calcium 74/25/9563 9.1  8.9 - 10.3 mg/dL Final   GFR, Estimated 09/13/2022 >60  >60 mL/min Final   Comment: (NOTE) Calculated using the CKD-EPI Creatinine Equation (2021)    Anion gap 09/13/2022 10  5 - 15 Final   Performed at [facility], [address], [place], [place] [address]   Vitamin B-12 09/13/2022 585  180 - 914 pg/mL Final   Comment: (NOTE) This assay is not validated for testing neonatal or myeloproliferative syndrome specimens for Vitamin B12 levels. Performed at [facility], [address] [place], [place] [address]    Color, Urine 09/14/2022 YELLOW  YELLOW Final   APPearance 09/14/2022 HAZY (A)  CLEAR Final   Specific Gravity, Urine 09/14/2022 1.025  1.005 - 1.030 Final    pH 09/14/2022 5.0  5.0 - 8.0  Final   Glucose, UA 09/14/2022 NEGATIVE  NEGATIVE mg/dL Final   Hgb urine dipstick 09/14/2022 NEGATIVE  NEGATIVE Final   Bilirubin Urine 09/14/2022 NEGATIVE  NEGATIVE Final   Ketones, ur 09/14/2022 NEGATIVE  NEGATIVE mg/dL Final   Protein, ur 09/14/2022 NEGATIVE  NEGATIVE mg/dL Final   Nitrite 09/14/2022 NEGATIVE  NEGATIVE Final   Leukocytes,Ua 09/14/2022 TRACE (A)  NEGATIVE Final   RBC / HPF 09/14/2022 0-5  0 - 5 RBC/hpf Final   WBC, UA 09/14/2022 0-5  0 - 5 WBC/hpf Final   Bacteria, UA 09/14/2022 RARE (A)  NONE SEEN Final   Squamous Epithelial / HPF 09/14/2022 0-5  0 - 5 Final   Mucus 09/14/2022 PRESENT   Final   Performed at [facility], [place] [address], [place], [place] [address]   Glucose-Capillary 09/16/2022 105 (H)  70 - 99 mg/dL Final   Glucose reference range applies only to samples taken after fasting for at least 8 hours.   SARS Coronavirus 2 by RT PCR 09/30/2022 NEGATIVE  NEGATIVE Final   Comment: (NOTE) SARS-CoV-2 target nucleic acids are NOT DETECTED.  The SARS-CoV-2 RNA is generally detectable in upper respiratory specimens during the acute phase of infection. The lowest concentration of SARS-CoV-2 viral copies this assay can detect is 138 copies/mL. A negative result does not preclude SARS-Cov-2 infection and should not be used as the sole basis for treatment or other patient management decisions. A negative result may occur with  improper specimen collection/handling, submission of specimen other than nasopharyngeal swab, presence of viral mutation(s) within the areas targeted by this assay, and inadequate number of viral copies(<138 copies/mL). A negative result must be combined with clinical observations, patient history, and epidemiological information. The expected result is Negative.  Fact Sheet for Patients:  EntrepreneurPulse.com.au  Fact Sheet for Healthcare Providers:   IncredibleEmployment.be  This test is no                          t yet approved or cleared by the [place] FDA and  has been authorized for detection and/or diagnosis of SARS-CoV-2 by FDA under an Emergency Use Authorization (EUA). This EUA will remain  in effect (meaning this test can be used) for the duration of the COVID-19 declaration under Section 564(b)(1) of the Act, 21 U.S.C.section 360bbb-3(b)(1), unless the authorization is terminated  or revoked sooner.       Influenza A by PCR 09/30/2022 NEGATIVE  NEGATIVE Final   Influenza B by PCR 09/30/2022 NEGATIVE  NEGATIVE Final   Comment: (NOTE) The Xpert Xpress SARS-CoV-2/FLU/RSV plus assay is intended as an aid in the diagnosis of influenza from Nasopharyngeal swab specimens and should not be used as a sole basis for treatment. Nasal washings and aspirates are unacceptable for Xpert Xpress SARS-CoV-2/FLU/RSV testing.  Fact Sheet for Patients: EntrepreneurPulse.com.au  Fact Sheet for Healthcare Providers: IncredibleEmployment.be  This test is not yet approved or cleared by the [place] FDA and has been authorized for detection and/or diagnosis of SARS-CoV-2 by FDA under an Emergency Use Authorization (EUA). This EUA will remain in effect (meaning this test can be used) for the duration of the COVID-19 declaration under Section 564(b)(1) of the Act, 21 U.S.C. section 360bbb-3(b)(1), unless the authorization is terminated or revoked.     Resp Syncytial Virus by PCR 09/30/2022 NEGATIVE  NEGATIVE Final   Comment: (NOTE) Fact Sheet for Patients: EntrepreneurPulse.com.au  Fact Sheet for Healthcare Providers: IncredibleEmployment.be  This test  is not yet approved or cleared by the [place] and has been authorized for detection and/or diagnosis of SARS-CoV-2 by FDA under an Emergency Use Authorization (EUA). This EUA  will remain in effect (meaning this test can be used) for the duration of the COVID-19 declaration under Section 564(b)(1) of the Act, 21 U.S.C. section 360bbb-3(b)(1), unless the authorization is terminated or revoked.  Performed at [facility], [place] [address] [place], [place] [address]    Sodium 10/01/2022 136  135 - 145 mmol/L Final   Potassium 10/01/2022 3.8  3.5 - 5.1 mmol/L Final   Chloride 10/01/2022 104  98 - 111 mmol/L Final   CO2 10/01/2022 27  22 - 32 mmol/L Final   Glucose, Bld 10/01/2022 98  70 - 99 mg/dL Final   Glucose reference range applies only to samples taken after fasting for at least 8 hours.   BUN 10/01/2022 12  6 - 20 mg/dL Final   Creatinine, Ser 10/01/2022 0.98  0.44 - 1.00 mg/dL Final   Calcium 10/01/2022 8.9  8.9 - 10.3 mg/dL Final   GFR, Estimated 10/01/2022 >60  >60 mL/min Final   Comment: (NOTE) Calculated using the CKD-EPI Creatinine Equation (2021)    Anion gap 10/01/2022 5  5 - 15 Final   Performed at [facility], [place] [address], [place], [place] [address]   WBC 10/01/2022 5.1  4.0 - 10.5 K/uL Final   RBC 10/01/2022 4.31  3.87 - 5.11 MIL/uL Final   Hemoglobin 10/01/2022 12.7  12.0 - 15.0 g/dL Final   HCT 10/01/2022 38.8  36.0 - 46.0 % Final   MCV 10/01/2022 90.0  80.0 - 100.0 fL Final   MCH 10/01/2022 29.5  26.0 - 34.0 pg Final   MCHC 10/01/2022 32.7  30.0 - 36.0 g/dL Final   RDW 10/01/2022 12.4  11.5 - 15.5 % Final   Platelets 10/01/2022 300  150 - 400 K/uL Final   nRBC 10/01/2022 0.0  0.0 - 0.2 % Final   Performed at [place] [address] [place], [place] [address]   Lipase 10/01/2022 29  11 - 51 U/L Final   Performed at [place] [address] [place], [place] [address]   Color, Urine 10/05/2022 YELLOW  YELLOW Final   APPearance 10/05/2022 HAZY (A)  CLEAR Final   Specific Gravity, Urine 10/05/2022 1.018  1.005 - 1.030 Final   pH 10/05/2022 5.0  5.0 - 8.0 Final   Glucose, UA 10/05/2022 NEGATIVE   NEGATIVE mg/dL Final   Hgb urine dipstick 10/05/2022 NEGATIVE  NEGATIVE Final   Bilirubin Urine 10/05/2022 NEGATIVE  NEGATIVE Final   Ketones, ur 10/05/2022 NEGATIVE  NEGATIVE mg/dL Final   Protein, ur 10/05/2022 NEGATIVE  NEGATIVE mg/dL Final   Nitrite 10/05/2022 NEGATIVE  NEGATIVE Final   Leukocytes,Ua 10/05/2022 TRACE (A)  NEGATIVE Final   RBC / HPF 10/05/2022 0-5  0 - 5 RBC/hpf Final   WBC, UA 10/05/2022 0-5  0 - 5 WBC/hpf Final   Bacteria, UA 10/05/2022 RARE (A)  NONE SEEN Final   Squamous Epithelial / HPF 10/05/2022 0-5  0 - 5 Final   Mucus 10/05/2022 PRESENT   Final   Performed at [facility], [place] [address], [place], [place] [address]   Specimen Description 10/05/2022 URINE, CLEAN CATCH   Final   Special Requests 10/05/2022    Final  Value:NONE Performed at [facility], [place] [address], [place], [place] [address]    Culture 10/05/2022 10,000 COLONIES/mL MULTIPLE SPECIES PRESENT, SUGGEST RECOLLECTION (A)   Final   Report Status 10/05/2022 10/07/2022 FINAL   Final  Admission on 07/04/2022, Discharged on 07/05/2022  Component Date Value Ref Range Status   Sodium 07/04/2022 142  135 - 145 mmol/L Final   Potassium 07/04/2022 4.4  3.5 - 5.1 mmol/L Final   Chloride 07/04/2022 109  98 - 111 mmol/L Final   CO2 07/04/2022 26  22 - 32 mmol/L Final   Glucose, Bld 07/04/2022 96  70 - 99 mg/dL Final   Glucose reference range applies only to samples taken after fasting for at least 8 hours.   BUN 07/04/2022 15  6 - 20 mg/dL Final   Creatinine, Ser 07/04/2022 0.83  0.44 - 1.00 mg/dL Final   Calcium 07/04/2022 9.3  8.9 - 10.3 mg/dL Final   Total Protein 07/04/2022 7.2  6.5 - 8.1 g/dL Final   Albumin 07/04/2022 3.9  3.5 - 5.0 g/dL Final   AST 07/04/2022 23  15 - 41 U/L Final   ALT 07/04/2022 28  0 - 44 U/L Final   Alkaline Phosphatase 07/04/2022 77  38 - 126 U/L Final   Total Bilirubin 07/04/2022 0.4  0.3 - 1.2 mg/dL Final   GFR, Estimated 07/04/2022 >60  >60  mL/min Final   Comment: (NOTE) Calculated using the CKD-EPI Creatinine Equation (2021)    Anion gap 07/04/2022 7  5 - 15 Final   Performed at [facility], [place] [address], [place], [place] [address]   Alcohol, Ethyl (B) 07/04/2022 <10  <10 mg/dL Final   Comment: (NOTE) Lowest detectable limit for serum alcohol is 10 mg/dL.  For medical purposes only. Performed at [facility], [place] [address] [place], [place] [address]    WBC 07/04/2022 7.0  4.0 - 10.5 K/uL Final   RBC 07/04/2022 4.18  3.87 - 5.11 MIL/uL Final   Hemoglobin 07/04/2022 12.8  12.0 - 15.0 g/dL Final   HCT 07/04/2022 39.1  36.0 - 46.0 % Final   MCV 07/04/2022 93.5  80.0 - 100.0 fL Final   MCH 07/04/2022 30.6  26.0 - 34.0 pg Final   MCHC 07/04/2022 32.7  30.0 - 36.0 g/dL Final   RDW 07/04/2022 12.9  11.5 - 15.5 % Final   Platelets 07/04/2022 243  150 - 400 K/uL Final   nRBC 07/04/2022 0.0  0.0 - 0.2 % Final   Neutrophils Relative % 07/04/2022 43  % Final   Neutro Abs 07/04/2022 3.0  1.7 - 7.7 K/uL Final   Lymphocytes Relative 07/04/2022 50  % Final   Lymphs Abs 07/04/2022 3.5  0.7 - 4.0 K/uL Final   Monocytes Relative 07/04/2022 7  % Final   Monocytes Absolute 07/04/2022 0.5  0.1 - 1.0 K/uL Final   Eosinophils Relative 07/04/2022 0  % Final   Eosinophils Absolute 07/04/2022 0.0  0.0 - 0.5 K/uL Final   Basophils Relative 07/04/2022 0  % Final   Basophils Absolute 07/04/2022 0.0  0.0 - 0.1 K/uL Final   Immature Granulocytes 07/04/2022 0  % Final   Abs Immature Granulocytes 07/04/2022 0.01  0.00 - 0.07 K/uL Final   Performed at [facility], [place] [address], [place], [place] [address]   I-stat hCG, quantitative 07/04/2022 <5.0  <5 mIU/mL Final   Comment 3 07/04/2022  Final   Comment:   GEST. AGE      CONC.  (mIU/mL)   <=1 WEEK        5 - 50     2 WEEKS       50 - 500     3 WEEKS       100 - 10,000     4 WEEKS     1,000 - 30,000        FEMALE AND  NON-PREGNANT FEMALE:     LESS THAN 5 mIU/mL   Admission on 06/14/2022, Discharged on 06/14/2022  Component Date Value Ref Range Status   Color, UA 06/14/2022 yellow  yellow Final   Clarity, UA 06/14/2022 cloudy (A)  clear Final   Glucose, UA 06/14/2022 negative  negative mg/dL Final   Bilirubin, UA 06/14/2022 negative  negative Final   Ketones, POC UA 06/14/2022 negative  negative mg/dL Final   Spec Grav, UA 06/14/2022 1.025  1.010 - 1.025 Final   Blood, UA 06/14/2022 negative  negative Final   pH, UA 06/14/2022 7.5  5.0 - 8.0 Final   Protein Ur, POC 06/14/2022 =30 (A)  negative mg/dL Final   Urobilinogen, UA 06/14/2022 0.2  0.2 or 1.0 E.U./dL Final   Nitrite, UA 06/14/2022 Negative  Negative Final   Leukocytes, UA 06/14/2022 Negative  Negative Final   Preg Test, Ur 06/14/2022 Negative  Negative Final   Specimen Description 06/14/2022 URINE, CLEAN CATCH   Final   Special Requests 06/14/2022    Final                   Value:NONE Performed at [facility], [place] [address], [place], [place] [address]    Culture 06/14/2022 MULTIPLE SPECIES PRESENT, SUGGEST RECOLLECTION (A)   Final   Report Status 06/14/2022 06/16/2022 FINAL   Final  Admission on 06/07/2022, Discharged on 06/10/2022  Component Date Value Ref Range Status   SARS Coronavirus 2 by RT PCR 06/07/2022 NEGATIVE  NEGATIVE Final   Comment: (NOTE) SARS-CoV-2 target nucleic acids are NOT DETECTED.  The SARS-CoV-2 RNA is generally detectable in upper respiratory specimens during the acute phase of infection. The lowest concentration of SARS-CoV-2 viral copies this assay can detect is 138 copies/mL. A negative result does not preclude SARS-Cov-2 infection and should not be used as the sole basis for treatment or other patient management decisions. A negative result may occur with  improper specimen collection/handling, submission of specimen other than nasopharyngeal swab, presence of viral mutation(s) within the areas  targeted by this assay, and inadequate number of viral copies(<138 copies/mL). A negative result must be combined with clinical observations, patient history, and epidemiological information. The expected result is Negative.  Fact Sheet for Patients:  EntrepreneurPulse.com.au  Fact Sheet for Healthcare Providers:  IncredibleEmployment.be  This test is no                          t yet approved or cleared by the [place] FDA and  has been authorized for detection and/or diagnosis of SARS-CoV-2 by FDA under an Emergency Use Authorization (EUA). This EUA will remain  in effect (meaning this test can be used) for the duration of the COVID-19 declaration under Section 564(b)(1) of the Act, 21 U.S.C.section 360bbb-3(b)(1), unless the authorization is terminated  or revoked sooner.       Influenza A by PCR 06/07/2022 NEGATIVE  NEGATIVE Final   Influenza B by PCR 06/07/2022 NEGATIVE  NEGATIVE Final   Comment: (NOTE) The Xpert Xpress SARS-CoV-2/FLU/RSV plus assay is intended as an aid in the diagnosis of influenza from Nasopharyngeal swab specimens and should not be used as a sole basis for treatment. Nasal washings and aspirates are unacceptable for Xpert Xpress SARS-CoV-2/FLU/RSV testing.  Fact Sheet for Patients: EntrepreneurPulse.com.au  Fact Sheet for Healthcare Providers: IncredibleEmployment.be  This test is not yet approved or cleared by the [place] FDA and has been authorized for detection and/or diagnosis of SARS-CoV-2 by FDA under an Emergency Use Authorization (EUA). This EUA will remain in effect (meaning this test can be used) for the duration of the COVID-19 declaration under Section 564(b)(1) of the Act, 21 U.S.C. section 360bbb-3(b)(1), unless the authorization is terminated or revoked.  Performed at [facility], [place] [address], [place], [place] [address]    WBC 06/07/2022  5.7  4.0 - 10.5 K/uL Final   RBC 06/07/2022 4.39  3.87 - 5.11 MIL/uL Final   Hemoglobin 06/07/2022 13.2  12.0 - 15.0 g/dL Final   HCT 06/07/2022 40.4  36.0 - 46.0 % Final   MCV 06/07/2022 92.0  80.0 - 100.0 fL Final   MCH 06/07/2022 30.1  26.0 - 34.0 pg Final   MCHC 06/07/2022 32.7  30.0 - 36.0 g/dL Final   RDW 06/07/2022 12.4  11.5 - 15.5 % Final   Platelets 06/07/2022 308  150 - 400 K/uL Final   nRBC 06/07/2022 0.0  0.0 - 0.2 % Final   Neutrophils Relative % 06/07/2022 42  % Final   Neutro Abs 06/07/2022 2.4  1.7 - 7.7 K/uL Final   Lymphocytes Relative 06/07/2022 54  % Final   Lymphs Abs 06/07/2022 3.1  0.7 - 4.0 K/uL Final   Monocytes Relative 06/07/2022 4  % Final   Monocytes Absolute 06/07/2022 0.2  0.1 - 1.0 K/uL Final   Eosinophils Relative 06/07/2022 0  % Final   Eosinophils Absolute 06/07/2022 0.0  0.0 - 0.5 K/uL Final   Basophils Relative 06/07/2022 0  % Final   Basophils Absolute 06/07/2022 0.0  0.0 - 0.1 K/uL Final   Immature Granulocytes 06/07/2022 0  % Final   Abs Immature Granulocytes 06/07/2022 0.01  0.00 - 0.07 K/uL Final   Performed at [facility], [place] [address] [place], [place] [address]   Sodium 06/07/2022 139  135 - 145 mmol/L Final   Potassium 06/07/2022 4.0  3.5 - 5.1 mmol/L Final   Chloride 06/07/2022 104  98 - 111 mmol/L Final   CO2 06/07/2022 28  22 - 32 mmol/L Final   Glucose, Bld 06/07/2022 104 (H)  70 - 99 mg/dL Final   Glucose reference range applies only to samples taken after fasting for at least 8 hours.   BUN 06/07/2022 8  6 - 20 mg/dL Final   Creatinine, Ser 06/07/2022 0.84  0.44 - 1.00 mg/dL Final   Calcium 06/07/2022 9.1  8.9 - 10.3 mg/dL Final   Total Protein 06/07/2022 6.9  6.5 - 8.1 g/dL Final   Albumin 06/07/2022 3.7  3.5 - 5.0 g/dL Final   AST 06/07/2022 19  15 - 41 U/L Final   ALT 06/07/2022 22  0 - 44 U/L Final   Alkaline Phosphatase 06/07/2022 54  38 - 126 U/L Final   Total Bilirubin 06/07/2022 0.4  0.3 - 1.2 mg/dL Final    GFR, Estimated 06/07/2022 >60  >60 mL/min Final   Comment: (NOTE) Calculated using the CKD-EPI Creatinine Equation (2021)  Anion gap 06/07/2022 7  5 - 15 Final   Performed at [place] [address] [place], [place] [address]   Hgb A1c MFr Bld 06/07/2022 5.0  4.8 - 5.6 % Final   Comment: (NOTE) Pre diabetes:          5.7%-6.4%  Diabetes:              >6.4%  Glycemic control for   <7.0% adults with diabetes    Mean Plasma Glucose 06/07/2022 96.8  mg/dL Final   Performed at [facility], [place] [address] [place], [place] [address]   TSH 06/07/2022 1.620  0.350 - 4.500 uIU/mL Final   Comment: Performed by a 3rd Generation assay with a functional sensitivity of <=0.01 uIU/mL. Performed at [facility], [place] [address], [place], [place] [address]    RPR Ser Ql 06/07/2022 NON REACTIVE  NON REACTIVE Final   Performed at [facility], [place] [address], [place], [place] [address]   Color, Urine 06/07/2022 YELLOW  YELLOW Final   APPearance 06/07/2022 HAZY (A)  CLEAR Final   Specific Gravity, Urine 06/07/2022 1.018  1.005 - 1.030 Final   pH 06/07/2022 7.0  5.0 - 8.0 Final   Glucose, UA 06/07/2022 NEGATIVE  NEGATIVE mg/dL Final   Hgb urine dipstick 06/07/2022 NEGATIVE  NEGATIVE Final   Bilirubin Urine 06/07/2022 NEGATIVE  NEGATIVE Final   Ketones, ur 06/07/2022 NEGATIVE  NEGATIVE mg/dL Final   Protein, ur 06/07/2022 NEGATIVE  NEGATIVE mg/dL Final   Nitrite 06/07/2022 NEGATIVE  NEGATIVE Final   Leukocytes,Ua 06/07/2022 NEGATIVE  NEGATIVE Final   Performed at [facility], [place] [address], [place], [place] [address]   Cholesterol 06/07/2022 164  0 - 200 mg/dL Final   Triglycerides 06/07/2022 158 (H)  <150 mg/dL Final   HDL 06/07/2022 44  >40 mg/dL Final   Total CHOL/HDL Ratio 06/07/2022 3.7  RATIO Final   VLDL 06/07/2022 32  0 - 40 mg/dL Final   LDL Cholesterol 06/07/2022 88  0 - 99 mg/dL Final   Comment:        Total Cholesterol/HDL:CHD Risk Coronary  Heart Disease Risk Table                     Men   Women  1/2 Average Risk   3.4   3.3  Average Risk       5.0   4.4  2 X Average Risk   9.6   7.1  3 X Average Risk  23.4   11.0        Use the calculated Patient Ratio above and the CHD Risk Table to determine the patient's CHD Risk.        ATP III CLASSIFICATION (LDL):  <100     mg/dL   Optimal  100-129  mg/dL   Near or Above                    Optimal  130-159  mg/dL   Borderline  160-189  mg/dL   High  >190     mg/dL   Very High Performed at [place] [address] [place], [place] [address]    HIV Screen 4th Generation wRfx 06/07/2022 Non Reactive  Non Reactive Final   Performed at [facility], [place] [address], [place], [place] [address]   SARSCOV2ONAVIRUS 2 AG 06/07/2022 NEGATIVE  NEGATIVE Final   Comment: (NOTE)  SARS-CoV-2 antigen NOT DETECTED.   Negative results are presumptive.  Negative results do not preclude SARS-CoV-2 infection and should not be used as the sole basis for treatment or other patient management decisions, including infection  control decisions, particularly in the presence of clinical signs and  symptoms consistent with COVID-19, or in those who have been in contact with the virus.  Negative results must be combined with clinical observations, patient history, and epidemiological information. The expected result is Negative.  Fact Sheet for Patients: https://www.jennings-kim.com/  Fact Sheet for Healthcare Providers: https://alexander-rogers.biz/  This test is not yet approved or cleared by the [place] FDA and  has been authorized for detection and/or diagnosis of SARS-CoV-2 by FDA under an Emergency Use Authorization (EUA).  This EUA will remain in effect (meaning this test can be used) for the duration of  the COV                          ID-19 declaration under Section 564(b)(1) of the Act, 21 U.S.C. section 360bbb-3(b)(1), unless the  authorization is terminated or revoked sooner.     POC Amphetamine UR 06/07/2022 None Detected  NONE DETECTED (Cut Off Level 1000 ng/mL) Final   POC Secobarbital (BAR) 06/07/2022 None Detected  NONE DETECTED (Cut Off Level 300 ng/mL) Final   POC Buprenorphine (BUP) 06/07/2022 None Detected  NONE DETECTED (Cut Off Level 10 ng/mL) Final   POC Oxazepam (BZO) 06/07/2022 None Detected  NONE DETECTED (Cut Off Level 300 ng/mL) Final   POC Cocaine UR 06/07/2022 None Detected  NONE DETECTED (Cut Off Level 300 ng/mL) Final   POC Methamphetamine UR 06/07/2022 None Detected  NONE DETECTED (Cut Off Level 1000 ng/mL) Final   POC Morphine 06/07/2022 None Detected  NONE DETECTED (Cut Off Level 300 ng/mL) Final   POC Methadone UR 06/07/2022 None Detected  NONE DETECTED (Cut Off Level 300 ng/mL) Final   POC Oxycodone UR 06/07/2022 None Detected  NONE DETECTED (Cut Off Level 100 ng/mL) Final   POC Marijuana UR 06/07/2022 None Detected  NONE DETECTED (Cut Off Level 50 ng/mL) Final   Preg Test, Ur 06/08/2022 NEGATIVE  NEGATIVE Final   Comment:        THE SENSITIVITY OF THIS METHODOLOGY IS >20 mIU/mL. Performed at [facility], [address], [place], [place] [address]    Preg Test, Ur 06/08/2022 NEGATIVE  NEGATIVE Final   Comment:        THE SENSITIVITY OF THIS METHODOLOGY IS >24 mIU/mL     Blood Alcohol level:  Lab Results  Component Value Date   ETH <10 07/04/2022   ETH <10 02/07/2022    Metabolic Disorder Labs: Lab Results  Component Value Date   HGBA1C 5.7 (H) 10/15/2022   MPG 117 10/15/2022   MPG 96.8 06/07/2022   No results found for: "PROLACTIN" Lab Results  Component Value Date   CHOL 181 07/25/2022   TRIG 118 07/25/2022   HDL 45 07/25/2022   CHOLHDL 4.0 07/25/2022   VLDL 24 07/25/2022   LDLCALC 112 (H) 07/25/2022   LDLCALC 88 06/07/2022    Therapeutic Lab Levels: No results found for: "LITHIUM" No results found for: "VALPROATE" No results found for:  "CBMZ"  Physical Findings   GAD-7    Flowsheet Row Office Visit from 12/17/2021 in [facility]  Total GAD-7 Score 17      PHQ2-9    Flowsheet Row ED from  10/07/2022 in [facility] ED from 06/07/2022 in [facility] Office Visit from 12/17/2021 in [facility] for [place] at [facility] Total Score 0 1 2  PHQ-9 Total Score -- 2 8      Flowsheet Row ED from 10/07/2022 in [facility] Most recent reading at 11/07/2022  9:32 AM ED from 10/07/2022 in [facility] Emergency Department at [facility] Most recent reading at 10/07/2022 12:18 PM ED from 07/25/2022 in [facility] Most recent reading at 10/06/2022  9:38 AM  C-SSRS RISK CATEGORY No Risk No Risk No Risk        Musculoskeletal  Strength & Muscle Tone: within normal limits Gait & Station: normal Patient leans: N/A  Psychiatric Specialty Exam  Presentation  General Appearance:  Appropriate for Environment  Eye Contact: Good  Speech: Clear and Coherent; Normal Rate  Speech Volume: Normal  Handedness: Right   Mood and Affect  Mood: Euthymic  Affect: Full Range   Thought Process  Thought Processes: Coherent; Goal Directed; Linear  Descriptions of Associations:Intact  Orientation:Full (Time, Place and Person)  Thought Content:Logical  Diagnosis of Schizophrenia or Schizoaffective disorder in past: Yes  Duration of Psychotic Symptoms: Greater than six months   Hallucinations:Hallucinations: None  Ideas of Reference:None  Suicidal Thoughts:Suicidal Thoughts: No  Homicidal Thoughts:Homicidal Thoughts: No   Sensorium  Memory: Immediate Good  Judgment: Good  Insight: Good   Executive Functions  Concentration: Good  Attention Span: Good  Recall: Good  Fund of  Knowledge: Good  Language: Good   Psychomotor Activity  Psychomotor Activity: Psychomotor Activity: Normal   Assets  Assets: Communication Skills; Desire for Improvement; Financial Resources/Insurance; Leisure Time; Physical Health; Resilience; Social Support   Sleep  Sleep: Sleep: Good   Nutritional Assessment (For [facility] and [facility] admissions only) Has the patient had a weight loss or gain of 10 pounds or more in the last 3 months?: No Has the patient had a decrease in food intake/or appetite?: No Does the patient have dental problems?: No Does the patient have eating habits or behaviors that may be indicators of an eating disorder including binging or inducing vomiting?: No Has the patient recently lost weight without trying?: 0 Has the patient been eating poorly because of a decreased appetite?: 0 Malnutrition Screening Tool Score: 0    Physical Exam  Physical Exam Constitutional:      General: She is not in acute distress.    Appearance: She is not ill-appearing, toxic-appearing or diaphoretic.  Eyes:     General: No scleral icterus. Cardiovascular:     Rate and Rhythm: Tachycardia present.  Pulmonary:     Effort: Pulmonary effort is normal. No respiratory distress.  Neurological:     Mental Status: She is alert and oriented to person, place, and time.  Psychiatric:        Attention and Perception: Attention and perception normal.        Mood and Affect: Mood and affect normal.        Speech: Speech normal.        Behavior: Behavior normal. Behavior is cooperative.        Thought Content: Thought content normal.        Cognition and Memory: Cognition and memory normal.    Review of Systems  Constitutional:  Negative for chills and fever.  Respiratory:  Negative for shortness of breath.   Cardiovascular:  Negative for chest  pain and palpitations.  Gastrointestinal:  Negative for abdominal pain.  Neurological:  Negative for headaches.   Blood pressure  124/89, pulse (!) 106, temperature 97.8 F (36.6 C), temperature source Oral, resp. rate 18, height 5\' 1"  (1.549 m), weight 198 lb (89.8 kg), SpO2 100 %. Body mass index is 37.41 kg/m.  Treatment Plan Summary: Plan Pt remains psychiatrically cleared. Disposition is pending placement.  Tharon Aquas, NP 11/12/2022 8:42 AM

## 2022-11-12 NOTE — ED Notes (Signed)
Gave patient a hot meal for dinner.

## 2022-11-12 NOTE — ED Notes (Signed)
Pt calm and relax. Pt sitting on bed watching television. She has no c/o pain or distress. Will continue to monitor for safety

## 2022-11-12 NOTE — ED Notes (Signed)
Pt sleeping@this time. Breathing even and unlabored. Will continue to monitor for safety 

## 2022-11-12 NOTE — ED Provider Notes (Signed)
Pt complaining of "yeast infection". She states she has had yeast infections before and current symptoms are consistent with prior presentations. She states she is having white discharge, cottage cheese consistency. She states there is a mild "sour" smell. Has been having white discharge and smell for 2 days now. She denies itching or burning.   Order placed for fluconazole 150mg  once.

## 2022-11-12 NOTE — ED Notes (Signed)
Pt was given hamburger and veggies for lunch.

## 2022-11-12 NOTE — Care Management (Signed)
[facility] Care Management   Writer met with the patient.  Patient reports that she spoke to her foster father and she has changed her mind and she now wants to go into independent living instead of [facility].   Writer spoke to the IDD Case Manager (Merci) and informed her of the patient's and her legal guardian decision to allow the patient to consider placement in supervised independent living through [facility].    Writer informed the NP working with the patient and Dr. Dwyane Dee.

## 2022-11-13 DIAGNOSIS — N390 Urinary tract infection, site not specified: Secondary | ICD-10-CM | POA: Diagnosis not present

## 2022-11-13 DIAGNOSIS — F419 Anxiety disorder, unspecified: Secondary | ICD-10-CM | POA: Diagnosis not present

## 2022-11-13 DIAGNOSIS — F17200 Nicotine dependence, unspecified, uncomplicated: Secondary | ICD-10-CM | POA: Diagnosis not present

## 2022-11-13 DIAGNOSIS — E039 Hypothyroidism, unspecified: Secondary | ICD-10-CM | POA: Diagnosis not present

## 2022-11-13 DIAGNOSIS — F209 Schizophrenia, unspecified: Secondary | ICD-10-CM | POA: Diagnosis not present

## 2022-11-13 DIAGNOSIS — N9489 Other specified conditions associated with female genital organs and menstrual cycle: Secondary | ICD-10-CM | POA: Diagnosis not present

## 2022-11-13 DIAGNOSIS — Z1152 Encounter for screening for COVID-19: Secondary | ICD-10-CM | POA: Diagnosis not present

## 2022-11-13 DIAGNOSIS — F333 Major depressive disorder, recurrent, severe with psychotic symptoms: Secondary | ICD-10-CM | POA: Diagnosis not present

## 2022-11-13 DIAGNOSIS — E118 Type 2 diabetes mellitus with unspecified complications: Secondary | ICD-10-CM | POA: Diagnosis not present

## 2022-11-13 LAB — URINALYSIS, ROUTINE W REFLEX MICROSCOPIC
Bilirubin Urine: NEGATIVE
Glucose, UA: NEGATIVE mg/dL
Hgb urine dipstick: NEGATIVE
Ketones, ur: NEGATIVE mg/dL
Leukocytes,Ua: NEGATIVE
Nitrite: NEGATIVE
Protein, ur: NEGATIVE mg/dL
Specific Gravity, Urine: 1.023 (ref 1.005–1.030)
pH: 6 (ref 5.0–8.0)

## 2022-11-13 MED ORDER — HYDROCORTISONE 1 % EX CREA
TOPICAL_CREAM | Freq: Four times a day (QID) | CUTANEOUS | Status: DC | PRN
  Administered 2022-11-14: 1 via TOPICAL
  Filled 2022-11-13: qty 28

## 2022-11-13 NOTE — ED Notes (Signed)
Pt given lunch tray.

## 2022-11-13 NOTE — ED Notes (Signed)
Pt sleeping soundly.  Breathing even and unlabored.  Staff will continue to monitor for safety.

## 2022-11-13 NOTE — ED Provider Notes (Signed)
Behavioral Health Progress Note  Date and Time: 11/13/2022 8:51 AM Name: Nicole MassonLatasha [name] MRN:  [id number]  Nicole MassonLatasha Pangilinan is a 32 y/o female with PMH of SCZ unspecified, MDD with psychosis, who [facility] via [employer] after a verbal altercation with Jeannett SeniorJosephine Okeke (not patient's legal guardian) for transient SI. This is [facility] concerns this year. Patient has been dismissed from previous group home due to threatening SI and HI, then leaving the group home. 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  Subjective:    On reassessment, pt is asleep, awakens to name being called. She endorses euthymic mood. She denies suicidal, homicidal or violent ideations. She denies auditory visual hallucinations or paranoia. She states her plan for today is to go back to sleep. There is no evidence of internal preoccupation, agitation, aggression, distractibility. No paranoia or delusions elicited. Pt remains psychiatrically cleared. Disposition is pending placement.  Diagnosis:  Final diagnoses:  Tobacco use disorder  Schizophrenia, unspecified type (HCC)  MDD (major depressive disorder), recurrent episode, moderate (HCC)  Hypothyroidism, unspecified type    Total Time spent with patient: 15 minutes  Past Psychiatric History: SCZ unspecified, MDD with psychosis, anxiety, depression, tobacco use Past Medical History: Hypothroidism Family History: Hypertension - father diabetes Family Psychiatric  History: None reported Social History: Currently seeking placement  Additional Social History:   Sleep: Good  Appetite:  Good  Current Medications:  Current Facility-Administered Medications  Medication Dose Route Frequency Provider Last Rate Last Admin   acetaminophen (TYLENOL) tablet 650 mg  650 mg Oral Q6H PRN Marlou SaByungura, Veronique M, NP   650 mg at 11/04/22 2154   alum & mag hydroxide-simeth (MAALOX/MYLANTA) 200-200-20 MG/5ML  suspension 30 mL  30 mL Oral Q4H PRN Marlou SaByungura, Veronique M, NP   30 mL at 11/11/22 0052   ARIPiprazole ER (ABILIFY MAINTENA) 400 MG prefilled syringe 400 mg  400 mg Intramuscular Q28 days Princess BruinsNguyen, Julie, DO   400 mg at 10/25/22 0920   cetaphil lotion   Topical PRN Princess BruinsNguyen, Julie, DO       fluticasone (FLONASE) 50 MCG/ACT nasal spray 1 spray  1 spray Each Nare QHS Lauree ChandlerLee, Fawzi Melman Eun, NP   1 spray at 11/12/22 2133   hydrOXYzine (ATARAX) tablet 25 mg  25 mg Oral TID PRN Lorri Frederickarrion-Carrero, Margely, MD   25 mg at 11/12/22 2132   ketoconazole (NIZORAL) 2 % cream   Topical BID Princess BruinsNguyen, Julie, DO   Given at 11/12/22 1018   levothyroxine (SYNTHROID) tablet 100 mcg  100 mcg Oral Once Byungura, Veronique M, NP       levothyroxine (SYNTHROID) tablet 100 mcg  100 mcg Oral Q0600 Marlou SaByungura, Veronique M, NP   100 mcg at 11/13/22 0648   lidocaine (LIDODERM) 5 % 1 patch  1 patch Transdermal Q24H Princess BruinsNguyen, Julie, DO   1 patch at 11/12/22 1017   loratadine (CLARITIN) tablet 10 mg  10 mg Oral Daily Carrion-Carrero, Margely, MD   10 mg at 11/12/22 1019   magnesium hydroxide (MILK OF MAGNESIA) suspension 30 mL  30 mL Oral Daily PRN Marlou SaByungura, Veronique M, NP   30 mL at 10/12/22 2145   metFORMIN (GLUCOPHAGE) tablet 500 mg  500 mg Oral Q breakfast Carrion-Carrero, Margely, MD   500 mg at 11/12/22 1019   nicotine (NICODERM CQ - dosed in mg/24 hours) patch 14 mg  14 mg Transdermal Daily PRN Carrion-Carrero, Karle StarchMargely, MD       ondansetron (ZOFRAN-ODT) disintegrating  tablet 4 mg  4 mg Oral Q8H PRN Carrion-Carrero, Caryl Ada, MD   4 mg at 11/01/22 2246   Oxcarbazepine (TRILEPTAL) tablet 300 mg  300 mg Oral BID Byungura, Veronique M, NP   300 mg at 11/12/22 2133   pantoprazole (PROTONIX) EC tablet 40 mg  40 mg Oral Daily Carrion-Carrero, Margely, MD   40 mg at 11/12/22 1019   polyethylene glycol (MIRALAX / GLYCOLAX) packet 17 g  17 g Oral Daily Merrily Brittle, DO   17 g at 11/12/22 1018   QUEtiapine (SEROQUEL) tablet 400 mg  400 mg Oral BID  Byungura, Veronique M, NP   400 mg at 11/12/22 2133   sertraline (ZOLOFT) tablet 150 mg  150 mg Oral Daily Carrion-Carrero, Margely, MD   150 mg at 11/12/22 1019   sodium chloride (OCEAN) 0.65 % nasal spray 1 spray  1 spray Each Nare Daily Merrily Brittle, DO   1 spray at 11/12/22 1017   traZODone (DESYREL) tablet 100 mg  100 mg Oral QHS Ronelle Nigh M, NP   100 mg at 11/12/22 2133   traZODone (DESYREL) tablet 50 mg  50 mg Oral QHS PRN Haynes Kerns, NP   50 mg at 11/12/22 2133   valACYclovir (VALTREX) tablet 500 mg  500 mg Oral Daily Carrion-Carrero, Caryl Ada, MD   500 mg at 11/12/22 1019   zinc oxide (BALMEX) 11.3 % cream   Topical PRN Merrily Brittle, DO       Current Outpatient Medications  Medication Sig Dispense Refill   ABILIFY MAINTENA 400 MG PRSY prefilled syringe 400 mg every 28 (twenty-eight) days.     cetirizine (ZYRTEC) 10 MG tablet Take 10 mg by mouth daily.     cyclobenzaprine (FLEXERIL) 10 MG tablet Take 1 tablet (10 mg total) by mouth 2 (two) times daily as needed for muscle spasms. 20 tablet 0   fluticasone (FLONASE) 50 MCG/ACT nasal spray Place 1 spray into both nostrils daily.     meloxicam (MOBIC) 15 MG tablet Take 15 mg by mouth daily.     nitrofurantoin, macrocrystal-monohydrate, (MACROBID) 100 MG capsule Take 1 capsule (100 mg total) by mouth 2 (two) times daily. 10 capsule 0   ondansetron (ZOFRAN-ODT) 4 MG disintegrating tablet Take 1 tablet (4 mg total) by mouth every 8 (eight) hours as needed for nausea or vomiting. 20 tablet 0   Oxcarbazepine (TRILEPTAL) 300 MG tablet Take 1 tablet (300 mg total) by mouth 2 (two) times daily. 60 tablet 0   QUEtiapine (SEROQUEL) 400 MG tablet Take 1 tablet (400 mg total) by mouth 2 (two) times daily. 60 tablet 0   sertraline (ZOLOFT) 50 MG tablet Take 3 tablets (150 mg total) by mouth in the morning. 90 tablet 0   traZODone (DESYREL) 100 MG tablet Take 1 tablet (100 mg total) by mouth at bedtime. 30 tablet 0   valACYclovir  (VALTREX) 500 MG tablet Take 500 mg by mouth daily.      Labs  Lab Results:  Admission on 10/07/2022  Component Date Value Ref Range Status   Hgb A1c MFr Bld 10/15/2022 5.7 (H)  4.8 - 5.6 % Final   Comment: (NOTE)         Prediabetes: 5.7 - 6.4         Diabetes: >6.4         Glycemic control for adults with diabetes: <7.0    Mean Plasma Glucose 10/15/2022 117  mg/dL Final   Comment: (NOTE) Performed At: [facility] [address]  [place], [place] [id number] Rush Farmer MD [id number]   Office Visit on 10/21/2022  Component Date Value Ref Range Status   SARSCOV2ONAVIRUS 2 AG 11/01/2022 NEGATIVE  NEGATIVE Final   Comment: (NOTE) SARS-CoV-2 antigen NOT DETECTED.   Negative results are presumptive.  Negative results do not preclude SARS-CoV-2 infection and should not be used as the sole basis for treatment or other patient management decisions, including infection  control decisions, particularly in the presence of clinical signs and  symptoms consistent with COVID-19, or in those who have been in contact with the virus.  Negative results must be combined with clinical observations, patient history, and epidemiological information. The expected result is Negative.  Fact Sheet for Patients: HandmadeRecipes.com.cy  Fact Sheet for Healthcare Providers: FuneralLife.at  This test is not yet approved or cleared by the [place] FDA and  has been authorized for detection and/or diagnosis of SARS-CoV-2 by FDA under an Emergency Use Authorization (EUA).  This EUA will remain in effect (meaning this test can be used) for the duration of  the COV                          ID-19 declaration under Section 564(b)(1) of the Act, 21 U.S.C. section 360bbb-3(b)(1), unless the authorization is terminated or revoked sooner.    Admission on 10/07/2022, Discharged on 10/07/2022  Component Date Value Ref Range Status   Color,  Urine 10/07/2022 YELLOW  YELLOW Final   APPearance 10/07/2022 HAZY (A)  CLEAR Final   Specific Gravity, Urine 10/07/2022 1.011  1.005 - 1.030 Final   pH 10/07/2022 7.0  5.0 - 8.0 Final   Glucose, UA 10/07/2022 NEGATIVE  NEGATIVE mg/dL Final   Hgb urine dipstick 10/07/2022 SMALL (A)  NEGATIVE Final   Bilirubin Urine 10/07/2022 NEGATIVE  NEGATIVE Final   Ketones, ur 10/07/2022 NEGATIVE  NEGATIVE mg/dL Final   Protein, ur 10/07/2022 NEGATIVE  NEGATIVE mg/dL Final   Nitrite 10/07/2022 NEGATIVE  NEGATIVE Final   Leukocytes,Ua 10/07/2022 LARGE (A)  NEGATIVE Final   RBC / HPF 10/07/2022 0-5  0 - 5 RBC/hpf Final   WBC, UA 10/07/2022 0-5  0 - 5 WBC/hpf Final   Bacteria, UA 10/07/2022 FEW (A)  NONE SEEN Final   Squamous Epithelial / HPF 10/07/2022 11-20  0 - 5 Final   Performed at [facility], [place] [address] [place], [place] [address]   I-stat hCG, quantitative 10/07/2022 <5.0  <5 mIU/mL Final   Comment 3 10/07/2022          Final   Comment:   GEST. AGE      CONC.  (mIU/mL)   <=1 WEEK        5 - 50     2 WEEKS       50 - 500     3 WEEKS       100 - 10,000     4 WEEKS     1,000 - 30,000        FEMALE AND NON-PREGNANT FEMALE:     LESS THAN 5 mIU/mL    Sodium 10/07/2022 138  135 - 145 mmol/L Final   Potassium 10/07/2022 4.3  3.5 - 5.1 mmol/L Final   Chloride 10/07/2022 104  98 - 111 mmol/L Final   CO2 10/07/2022 28  22 - 32 mmol/L Final   Glucose, Bld 10/07/2022 90  70 - 99 mg/dL Final   Glucose reference range applies only to samples  taken after fasting for at least 8 hours.   BUN 10/07/2022 8  6 - 20 mg/dL Final   Creatinine, Ser 10/07/2022 0.96  0.44 - 1.00 mg/dL Final   Calcium 10/07/2022 9.1  8.9 - 10.3 mg/dL Final   GFR, Estimated 10/07/2022 >60  >60 mL/min Final   Comment: (NOTE) Calculated using the CKD-EPI Creatinine Equation (2021)    Anion gap 10/07/2022 6  5 - 15 Final   Performed at [facility], [place] [address], [place], [place] [address]   WBC 10/07/2022 5.8   4.0 - 10.5 K/uL Final   RBC 10/07/2022 4.36  3.87 - 5.11 MIL/uL Final   Hemoglobin 10/07/2022 12.9  12.0 - 15.0 g/dL Final   HCT 10/07/2022 40.0  36.0 - 46.0 % Final   MCV 10/07/2022 91.7  80.0 - 100.0 fL Final   MCH 10/07/2022 29.6  26.0 - 34.0 pg Final   MCHC 10/07/2022 32.3  30.0 - 36.0 g/dL Final   RDW 10/07/2022 12.4  11.5 - 15.5 % Final   Platelets 10/07/2022 295  150 - 400 K/uL Final   nRBC 10/07/2022 0.0  0.0 - 0.2 % Final   Performed at [facility] [address] [place], [place] [address]   SARS Coronavirus 2 by RT PCR 10/07/2022 NEGATIVE  NEGATIVE Final   Comment: (NOTE) SARS-CoV-2 target nucleic acids are NOT DETECTED.  The SARS-CoV-2 RNA is generally detectable in upper respiratory specimens during the acute phase of infection. The lowest concentration of SARS-CoV-2 viral copies this assay can detect is 138 copies/mL. A negative result does not preclude SARS-Cov-2 infection and should not be used as the sole basis for treatment or other patient management decisions. A negative result may occur with  improper specimen collection/handling, submission of specimen other than nasopharyngeal swab, presence of viral mutation(s) within the areas targeted by this assay, and inadequate number of viral copies(<138 copies/mL). A negative result must be combined with clinical observations, patient history, and epidemiological information. The expected result is Negative.  Fact Sheet for Patients:  EntrepreneurPulse.com.au  Fact Sheet for Healthcare Providers:  IncredibleEmployment.be  This test is no                          t yet approved or cleared by the [place] FDA and  has been authorized for detection and/or diagnosis of SARS-CoV-2 by FDA under an Emergency Use Authorization (EUA). This EUA will remain  in effect (meaning this test can be used) for the duration of the COVID-19 declaration under Section 564(b)(1) of the  Act, 21 U.S.C.section 360bbb-3(b)(1), unless the authorization is terminated  or revoked sooner.       Influenza A by PCR 10/07/2022 NEGATIVE  NEGATIVE Final   Influenza B by PCR 10/07/2022 NEGATIVE  NEGATIVE Final   Comment: (NOTE) The Xpert Xpress SARS-CoV-2/FLU/RSV plus assay is intended as an aid in the diagnosis of influenza from Nasopharyngeal swab specimens and should not be used as a sole basis for treatment. Nasal washings and aspirates are unacceptable for Xpert Xpress SARS-CoV-2/FLU/RSV testing.  Fact Sheet for Patients: EntrepreneurPulse.com.au  Fact Sheet for Healthcare Providers: IncredibleEmployment.be  This test is not yet approved or cleared by the [place] FDA and has been authorized for detection and/or diagnosis of SARS-CoV-2 by FDA under an Emergency Use Authorization (EUA). This EUA will remain in effect (meaning this test can be used) for the duration of the COVID-19 declaration under Section  564(b)(1) of the Act, 21 U.S.C. section 360bbb-3(b)(1), unless the authorization is terminated or revoked.     Resp Syncytial Virus by PCR 10/07/2022 NEGATIVE  NEGATIVE Final   Comment: (NOTE) Fact Sheet for Patients: EntrepreneurPulse.com.au  Fact Sheet for Healthcare Providers: IncredibleEmployment.be  This test is not yet approved or cleared by the [place] FDA and has been authorized for detection and/or diagnosis of SARS-CoV-2 by FDA under an Emergency Use Authorization (EUA). This EUA will remain in effect (meaning this test can be used) for the duration of the COVID-19 declaration under Section 564(b)(1) of the Act, 21 U.S.C. section 360bbb-3(b)(1), unless the authorization is terminated or revoked.  Performed at [facility], [place] [address] [place], [place] [address]    Specimen Description 10/07/2022 URINE, CLEAN CATCH   Final   Special Requests 10/07/2022     Final                   Value:NONE Performed at [facility], [place] [address], [place], [place] [address]    Culture 10/07/2022 MULTIPLE SPECIES PRESENT, SUGGEST RECOLLECTION (A)   Final   Report Status 10/07/2022 10/08/2022 FINAL   Final  Admission on 07/25/2022, Discharged on 10/07/2022  Component Date Value Ref Range Status   SARS Coronavirus 2 by RT PCR 07/25/2022 NEGATIVE  NEGATIVE Final   Comment: (NOTE) SARS-CoV-2 target nucleic acids are NOT DETECTED.  The SARS-CoV-2 RNA is generally detectable in upper respiratory specimens during the acute phase of infection. The lowest concentration of SARS-CoV-2 viral copies this assay can detect is 138 copies/mL. A negative result does not preclude SARS-Cov-2 infection and should not be used as the sole basis for treatment or other patient management decisions. A negative result may occur with  improper specimen collection/handling, submission of specimen other than nasopharyngeal swab, presence of viral mutation(s) within the areas targeted by this assay, and inadequate number of viral copies(<138 copies/mL). A negative result must be combined with clinical observations, patient history, and epidemiological information. The expected result is Negative.  Fact Sheet for Patients:  EntrepreneurPulse.com.au  Fact Sheet for Healthcare Providers:  IncredibleEmployment.be  This test is no                          t yet approved or cleared by the [place] FDA and  has been authorized for detection and/or diagnosis of SARS-CoV-2 by FDA under an Emergency Use Authorization (EUA). This EUA will remain  in effect (meaning this test can be used) for the duration of the COVID-19 declaration under Section 564(b)(1) of the Act, 21 U.S.C.section 360bbb-3(b)(1), unless the authorization is terminated  or revoked sooner.       Influenza A by PCR 07/25/2022 NEGATIVE  NEGATIVE Final   Influenza B by PCR  07/25/2022 NEGATIVE  NEGATIVE Final   Comment: (NOTE) The Xpert Xpress SARS-CoV-2/FLU/RSV plus assay is intended as an aid in the diagnosis of influenza from Nasopharyngeal swab specimens and should not be used as a sole basis for treatment. Nasal washings and aspirates are unacceptable for Xpert Xpress SARS-CoV-2/FLU/RSV testing.  Fact Sheet for Patients: EntrepreneurPulse.com.au  Fact Sheet for Healthcare Providers: IncredibleEmployment.be  This test is not yet approved or cleared by the [place] FDA and has been authorized for detection and/or diagnosis of SARS-CoV-2 by FDA under an Emergency Use Authorization (EUA). This EUA will remain in effect (meaning this test can be used) for the duration of the COVID-19  declaration under Section 564(b)(1) of the Act, 21 U.S.C. section 360bbb-3(b)(1), unless the authorization is terminated or revoked.  Performed at [facility], [place] [address] [place], [place] [address]    WBC 07/25/2022 8.3  4.0 - 10.5 K/uL Final   RBC 07/25/2022 4.44  3.87 - 5.11 MIL/uL Final   Hemoglobin 07/25/2022 13.7  12.0 - 15.0 g/dL Final   HCT 07/25/2022 40.2  36.0 - 46.0 % Final   MCV 07/25/2022 90.5  80.0 - 100.0 fL Final   MCH 07/25/2022 30.9  26.0 - 34.0 pg Final   MCHC 07/25/2022 34.1  30.0 - 36.0 g/dL Final   RDW 07/25/2022 12.2  11.5 - 15.5 % Final   Platelets 07/25/2022 248  150 - 400 K/uL Final   nRBC 07/25/2022 0.0  0.0 - 0.2 % Final   Neutrophils Relative % 07/25/2022 43  % Final   Neutro Abs 07/25/2022 3.6  1.7 - 7.7 K/uL Final   Lymphocytes Relative 07/25/2022 52  % Final   Lymphs Abs 07/25/2022 4.2 (H)  0.7 - 4.0 K/uL Final   Monocytes Relative 07/25/2022 5  % Final   Monocytes Absolute 07/25/2022 0.4  0.1 - 1.0 K/uL Final   Eosinophils Relative 07/25/2022 0  % Final   Eosinophils Absolute 07/25/2022 0.0  0.0 - 0.5 K/uL Final   Basophils Relative 07/25/2022 0  % Final   Basophils Absolute  07/25/2022 0.0  0.0 - 0.1 K/uL Final   Immature Granulocytes 07/25/2022 0  % Final   Abs Immature Granulocytes 07/25/2022 0.02  0.00 - 0.07 K/uL Final   Performed at [facility], [place] [address], [place], [place] [address]   Sodium 07/25/2022 138  135 - 145 mmol/L Final   Potassium 07/25/2022 4.0  3.5 - 5.1 mmol/L Final   Chloride 07/25/2022 104  98 - 111 mmol/L Final   CO2 07/25/2022 29  22 - 32 mmol/L Final   Glucose, Bld 07/25/2022 83  70 - 99 mg/dL Final   Glucose reference range applies only to samples taken after fasting for at least 8 hours.   BUN 07/25/2022 11  6 - 20 mg/dL Final   Creatinine, Ser 07/25/2022 0.97  0.44 - 1.00 mg/dL Final   Calcium 07/25/2022 9.2  8.9 - 10.3 mg/dL Final   Total Protein 07/25/2022 7.0  6.5 - 8.1 g/dL Final   Albumin 07/25/2022 3.8  3.5 - 5.0 g/dL Final   AST 07/25/2022 18  15 - 41 U/L Final   ALT 07/25/2022 22  0 - 44 U/L Final   Alkaline Phosphatase 07/25/2022 64  38 - 126 U/L Final   Total Bilirubin 07/25/2022 0.2 (L)  0.3 - 1.2 mg/dL Final   GFR, Estimated 07/25/2022 >60  >60 mL/min Final   Comment: (NOTE) Calculated using the CKD-EPI Creatinine Equation (2021)    Anion gap 07/25/2022 5  5 - 15 Final   Performed at [facility] [address] [place], [place] [address]   POC Amphetamine UR 07/25/2022 None Detected  NONE DETECTED (Cut Off Level 1000 ng/mL) Preliminary   POC Secobarbital (BAR) 07/25/2022 None Detected  NONE DETECTED (Cut Off Level 300 ng/mL) Preliminary   POC Buprenorphine (BUP) 07/25/2022 None Detected  NONE DETECTED (Cut Off Level 10 ng/mL) Preliminary   POC Oxazepam (BZO) 07/25/2022 None Detected  NONE DETECTED (Cut Off Level 300 ng/mL) Preliminary   POC Cocaine UR 07/25/2022 None Detected  NONE DETECTED (Cut Off Level 300 ng/mL) Preliminary  POC Methamphetamine UR 07/25/2022 None Detected  NONE DETECTED (Cut Off Level 1000 ng/mL) Preliminary   POC Morphine 07/25/2022 None Detected  NONE DETECTED (Cut Off Level  300 ng/mL) Preliminary   POC Methadone UR 07/25/2022 None Detected  NONE DETECTED (Cut Off Level 300 ng/mL) Preliminary   POC Oxycodone UR 07/25/2022 None Detected  NONE DETECTED (Cut Off Level 100 ng/mL) Preliminary   POC Marijuana UR 07/25/2022 None Detected  NONE DETECTED (Cut Off Level 50 ng/mL) Preliminary   SARSCOV2ONAVIRUS 2 AG 07/25/2022 NEGATIVE  NEGATIVE Final   Comment: (NOTE) SARS-CoV-2 antigen NOT DETECTED.   Negative results are presumptive.  Negative results do not preclude SARS-CoV-2 infection and should not be used as the sole basis for treatment or other patient management decisions, including infection  control decisions, particularly in the presence of clinical signs and  symptoms consistent with COVID-19, or in those who have been in contact with the virus.  Negative results must be combined with clinical observations, patient history, and epidemiological information. The expected result is Negative.  Fact Sheet for Patients: HandmadeRecipes.com.cy  Fact Sheet for Healthcare Providers: FuneralLife.at  This test is not yet approved or cleared by the [place] FDA and  has been authorized for detection and/or diagnosis of SARS-CoV-2 by FDA under an Emergency Use Authorization (EUA).  This EUA will remain in effect (meaning this test can be used) for the duration of  the COV                          ID-19 declaration under Section 564(b)(1) of the Act, 21 U.S.C. section 360bbb-3(b)(1), unless the authorization is terminated or revoked sooner.     Cholesterol 07/25/2022 181  0 - 200 mg/dL Final   Triglycerides 07/25/2022 118  <150 mg/dL Final   HDL 07/25/2022 45  >40 mg/dL Final   Total CHOL/HDL Ratio 07/25/2022 4.0  RATIO Final   VLDL 07/25/2022 24  0 - 40 mg/dL Final   LDL Cholesterol 07/25/2022 112 (H)  0 - 99 mg/dL Final   Comment:        Total Cholesterol/HDL:CHD Risk Coronary Heart Disease Risk Table                      Men   Women  1/2 Average Risk   3.4   3.3  Average Risk       5.0   4.4  2 X Average Risk   9.6   7.1  3 X Average Risk  23.4   11.0        Use the calculated Patient Ratio above and the CHD Risk Table to determine the patient's CHD Risk.        ATP III CLASSIFICATION (LDL):  <100     mg/dL   Optimal  100-129  mg/dL   Near or Above                    Optimal  130-159  mg/dL   Borderline  160-189  mg/dL   High  >190     mg/dL   Very High Performed at [facility] [address], [place], [place] [address]    TSH 07/25/2022 6.668 (H)  0.350 - 4.500 uIU/mL Final   Comment: Performed by a 3rd Generation assay with a functional sensitivity of <=0.01 uIU/mL. Performed at [facility], [place] [address] [place], [place] [address]    Glucose-Capillary  07/26/2022 104 (H)  70 - 99 mg/dL Final   Glucose reference range applies only to samples taken after fasting for at least 8 hours.   T3, Free 07/31/2022 2.3  2.0 - 4.4 pg/mL Final   Comment: (NOTE) Performed At: [facility] [place], [place] [id number] Rush Farmer MD [id number]    Free T4 07/31/2022 0.60 (L)  0.61 - 1.12 ng/dL Final   Comment: (NOTE) Biotin ingestion may interfere with free T4 tests. If the results are inconsistent with the TSH level, previous test results, or the clinical presentation, then consider biotin interference. If needed, order repeat testing after stopping biotin. Performed at [facility], [place] [address], [place], [place] [address]    Glucose-Capillary 08/29/2022 100 (H)  70 - 99 mg/dL Final   Glucose reference range applies only to samples taken after fasting for at least 8 hours.   TSH 09/05/2022 0.793  0.350 - 4.500 uIU/mL Final   Comment: Performed by a 3rd Generation assay with a functional sensitivity of <=0.01 uIU/mL. Performed at [facility], [place] [address] [place], [place] [address]    Free T4 09/05/2022 0.73  0.61 - 1.12  ng/dL Final   Comment: (NOTE) Biotin ingestion may interfere with free T4 tests. If the results are inconsistent with the TSH level, previous test results, or the clinical presentation, then consider biotin interference. If needed, order repeat testing after stopping biotin. Performed at [facility], [place] [address], [place], [place] [address]    Preg Test, Ur 09/06/2022 Negative  Negative Final   Preg Test, Ur 09/06/2022 NEGATIVE  NEGATIVE Final   Comment:        THE SENSITIVITY OF THIS METHODOLOGY IS >24 mIU/mL    Preg Test, Ur 09/05/2022 NEGATIVE  NEGATIVE Final   Comment:        THE SENSITIVITY OF THIS METHODOLOGY IS >24 mIU/mL    Sodium 09/13/2022 136  135 - 145 mmol/L Final   Potassium 09/13/2022 4.5  3.5 - 5.1 mmol/L Final   Chloride 09/13/2022 105  98 - 111 mmol/L Final   CO2 09/13/2022 21 (L)  22 - 32 mmol/L Final   Glucose, Bld 09/13/2022 111 (H)  70 - 99 mg/dL Final   Glucose reference range applies only to samples taken after fasting for at least 8 hours.   BUN 09/13/2022 14  6 - 20 mg/dL Final   Creatinine, Ser 09/13/2022 0.92  0.44 - 1.00 mg/dL Final   Calcium 09/13/2022 9.1  8.9 - 10.3 mg/dL Final   GFR, Estimated 09/13/2022 >60  >60 mL/min Final   Comment: (NOTE) Calculated using the CKD-EPI Creatinine Equation (2021)    Anion gap 09/13/2022 10  5 - 15 Final   Performed at [facility], [place] [address] [place], [place] [address]   Vitamin B-12 09/13/2022 585  180 - 914 pg/mL Final   Comment: (NOTE) This assay is not validated for testing neonatal or myeloproliferative syndrome specimens for Vitamin B12 levels. Performed at [facility], [place] [address] [place], [place] [address]    Color, Urine 09/14/2022 YELLOW  YELLOW Final   APPearance 09/14/2022 HAZY (A)  CLEAR Final   Specific Gravity, Urine 09/14/2022 1.025  1.005 - 1.030 Final   pH 09/14/2022 5.0  5.0 - 8.0 Final   Glucose, UA 09/14/2022 NEGATIVE  NEGATIVE mg/dL Final   Hgb urine  dipstick 09/14/2022 NEGATIVE  NEGATIVE Final   Bilirubin Urine 09/14/2022 NEGATIVE  NEGATIVE Final   Ketones, ur 09/14/2022 NEGATIVE  NEGATIVE mg/dL Final   Protein, ur 09/14/2022 NEGATIVE  NEGATIVE mg/dL Final   Nitrite 09/14/2022 NEGATIVE  NEGATIVE Final   Leukocytes,Ua 09/14/2022 TRACE (A)  NEGATIVE Final   RBC / HPF 09/14/2022 0-5  0 - 5 RBC/hpf Final   WBC, UA 09/14/2022 0-5  0 - 5 WBC/hpf Final   Bacteria, UA 09/14/2022 RARE (A)  NONE SEEN Final   Squamous Epithelial / HPF 09/14/2022 0-5  0 - 5 Final   Mucus 09/14/2022 PRESENT   Final   Performed at [facility], [place] [address] [place], [place] [address]   Glucose-Capillary 09/16/2022 105 (H)  70 - 99 mg/dL Final   Glucose reference range applies only to samples taken after fasting for at least 8 hours.   SARS Coronavirus 2 by RT PCR 09/30/2022 NEGATIVE  NEGATIVE Final   Comment: (NOTE) SARS-CoV-2 target nucleic acids are NOT DETECTED.  The SARS-CoV-2 RNA is generally detectable in upper respiratory specimens during the acute phase of infection. The lowest concentration of SARS-CoV-2 viral copies this assay can detect is 138 copies/mL. A negative result does not preclude SARS-Cov-2 infection and should not be used as the sole basis for treatment or other patient management decisions. A negative result may occur with  improper specimen collection/handling, submission of specimen other than nasopharyngeal swab, presence of viral mutation(s) within the areas targeted by this assay, and inadequate number of viral copies(<138 copies/mL). A negative result must be combined with clinical observations, patient history, and epidemiological information. The expected result is Negative.  Fact Sheet for Patients:  EntrepreneurPulse.com.au  Fact Sheet for Healthcare Providers:  IncredibleEmployment.be  This test is no                          t yet approved or cleared by the [place] FDA  and  has been authorized for detection and/or diagnosis of SARS-CoV-2 by FDA under an Emergency Use Authorization (EUA). This EUA will remain  in effect (meaning this test can be used) for the duration of the COVID-19 declaration under Section 564(b)(1) of the Act, 21 U.S.C.section 360bbb-3(b)(1), unless the authorization is terminated  or revoked sooner.       Influenza A by PCR 09/30/2022 NEGATIVE  NEGATIVE Final   Influenza B by PCR 09/30/2022 NEGATIVE  NEGATIVE Final   Comment: (NOTE) The Xpert Xpress SARS-CoV-2/FLU/RSV plus assay is intended as an aid in the diagnosis of influenza from Nasopharyngeal swab specimens and should not be used as a sole basis for treatment. Nasal washings and aspirates are unacceptable for Xpert Xpress SARS-CoV-2/FLU/RSV testing.  Fact Sheet for Patients: EntrepreneurPulse.com.au  Fact Sheet for Healthcare Providers: IncredibleEmployment.be  This test is not yet approved or cleared by the [place] FDA and has been authorized for detection and/or diagnosis of SARS-CoV-2 by FDA under an Emergency Use Authorization (EUA). This EUA will remain in effect (meaning this test can be used) for the duration of the COVID-19 declaration under Section 564(b)(1) of the Act, 21 U.S.C. section 360bbb-3(b)(1), unless the authorization is terminated or revoked.     Resp Syncytial Virus by PCR 09/30/2022 NEGATIVE  NEGATIVE Final   Comment: (NOTE) Fact Sheet for Patients: EntrepreneurPulse.com.au  Fact Sheet for Healthcare Providers: IncredibleEmployment.be  This test is not yet approved or cleared by the [place] FDA and has been authorized for detection and/or diagnosis of SARS-CoV-2 by FDA under an Emergency Use Authorization (  EUA). This EUA will remain in effect (meaning this test can be used) for the duration of the COVID-19 declaration under Section 564(b)(1) of the Act, 21  U.S.C. section 360bbb-3(b)(1), unless the authorization is terminated or revoked.  Performed at [facility], [address] [place], [place] [address]    Sodium 10/01/2022 136  135 - 145 mmol/L Final   Potassium 10/01/2022 3.8  3.5 - 5.1 mmol/L Final   Chloride 10/01/2022 104  98 - 111 mmol/L Final   CO2 10/01/2022 27  22 - 32 mmol/L Final   Glucose, Bld 10/01/2022 98  70 - 99 mg/dL Final   Glucose reference range applies only to samples taken after fasting for at least 8 hours.   BUN 10/01/2022 12  6 - 20 mg/dL Final   Creatinine, Ser 10/01/2022 0.98  0.44 - 1.00 mg/dL Final   Calcium 60/45/409812/19/2023 8.9  8.9 - 10.3 mg/dL Final   GFR, Estimated 10/01/2022 >60  >60 mL/min Final   Comment: (NOTE) Calculated using the CKD-EPI Creatinine Equation (2021)    Anion gap 10/01/2022 5  5 - 15 Final   Performed at [facility], [address] [place], [place] [address]   WBC 10/01/2022 5.1  4.0 - 10.5 K/uL Final   RBC 10/01/2022 4.31  3.87 - 5.11 MIL/uL Final   Hemoglobin 10/01/2022 12.7  12.0 - 15.0 g/dL Final   HCT 78/29/562112/19/2023 38.8  36.0 - 46.0 % Final   MCV 10/01/2022 90.0  80.0 - 100.0 fL Final   MCH 10/01/2022 29.5  26.0 - 34.0 pg Final   MCHC 10/01/2022 32.7  30.0 - 36.0 g/dL Final   RDW 30/86/578412/19/2023 12.4  11.5 - 15.5 % Final   Platelets 10/01/2022 300  150 - 400 K/uL Final   nRBC 10/01/2022 0.0  0.0 - 0.2 % Final   Performed at [facility], [address] [place], [place] [address]   Lipase 10/01/2022 29  11 - 51 U/L Final   Performed at [facility], [address] [place], [place] [address]   Color, Urine 10/05/2022 YELLOW  YELLOW Final   APPearance 10/05/2022 HAZY (A)  CLEAR Final   Specific Gravity, Urine 10/05/2022 1.018  1.005 - 1.030 Final   pH 10/05/2022 5.0  5.0 - 8.0 Final   Glucose, UA 10/05/2022 NEGATIVE  NEGATIVE mg/dL Final   Hgb urine dipstick 10/05/2022 NEGATIVE  NEGATIVE Final   Bilirubin Urine 10/05/2022 NEGATIVE  NEGATIVE Final    Ketones, ur 10/05/2022 NEGATIVE  NEGATIVE mg/dL Final   Protein, ur 32/44/010212/23/2023 NEGATIVE  NEGATIVE mg/dL Final   Nitrite 72/53/664412/23/2023 NEGATIVE  NEGATIVE Final   Leukocytes,Ua 10/05/2022 TRACE (A)  NEGATIVE Final   RBC / HPF 10/05/2022 0-5  0 - 5 RBC/hpf Final   WBC, UA 10/05/2022 0-5  0 - 5 WBC/hpf Final   Bacteria, UA 10/05/2022 RARE (A)  NONE SEEN Final   Squamous Epithelial / HPF 10/05/2022 0-5  0 - 5 Final   Mucus 10/05/2022 PRESENT   Final   Performed at [facility], [address] [place], [place] [address]   Specimen Description 10/05/2022 URINE, CLEAN CATCH   Final   Special Requests 10/05/2022    Final                   Value:NONE Performed at [facility], [address] [place], [place] [address]    Culture 10/05/2022 10,000 COLONIES/mL MULTIPLE SPECIES PRESENT,  SUGGEST RECOLLECTION (A)   Final   Report Status 10/05/2022 10/07/2022 FINAL   Final  Admission on 07/04/2022, Discharged on 07/05/2022  Component Date Value Ref Range Status   Sodium 07/04/2022 142  135 - 145 mmol/L Final   Potassium 07/04/2022 4.4  3.5 - 5.1 mmol/L Final   Chloride 07/04/2022 109  98 - 111 mmol/L Final   CO2 07/04/2022 26  22 - 32 mmol/L Final   Glucose, Bld 07/04/2022 96  70 - 99 mg/dL Final   Glucose reference range applies only to samples taken after fasting for at least 8 hours.   BUN 07/04/2022 15  6 - 20 mg/dL Final   Creatinine, Ser 07/04/2022 0.83  0.44 - 1.00 mg/dL Final   Calcium 07/04/2022 9.3  8.9 - 10.3 mg/dL Final   Total Protein 07/04/2022 7.2  6.5 - 8.1 g/dL Final   Albumin 07/04/2022 3.9  3.5 - 5.0 g/dL Final   AST 07/04/2022 23  15 - 41 U/L Final   ALT 07/04/2022 28  0 - 44 U/L Final   Alkaline Phosphatase 07/04/2022 77  38 - 126 U/L Final   Total Bilirubin 07/04/2022 0.4  0.3 - 1.2 mg/dL Final   GFR, Estimated 07/04/2022 >60  >60 mL/min Final   Comment: (NOTE) Calculated using the CKD-EPI Creatinine Equation (2021)    Anion gap 07/04/2022 7  5 - 15 Final    Performed at [facility], [place] [address], [place], [place] [address]   Alcohol, Ethyl (B) 07/04/2022 <10  <10 mg/dL Final   Comment: (NOTE) Lowest detectable limit for serum alcohol is 10 mg/dL.  For medical purposes only. Performed at [facility], [place] [address], [place], [place] [address]    WBC 07/04/2022 7.0  4.0 - 10.5 K/uL Final   RBC 07/04/2022 4.18  3.87 - 5.11 MIL/uL Final   Hemoglobin 07/04/2022 12.8  12.0 - 15.0 g/dL Final   HCT 07/04/2022 39.1  36.0 - 46.0 % Final   MCV 07/04/2022 93.5  80.0 - 100.0 fL Final   MCH 07/04/2022 30.6  26.0 - 34.0 pg Final   MCHC 07/04/2022 32.7  30.0 - 36.0 g/dL Final   RDW 07/04/2022 12.9  11.5 - 15.5 % Final   Platelets 07/04/2022 243  150 - 400 K/uL Final   nRBC 07/04/2022 0.0  0.0 - 0.2 % Final   Neutrophils Relative % 07/04/2022 43  % Final   Neutro Abs 07/04/2022 3.0  1.7 - 7.7 K/uL Final   Lymphocytes Relative 07/04/2022 50  % Final   Lymphs Abs 07/04/2022 3.5  0.7 - 4.0 K/uL Final   Monocytes Relative 07/04/2022 7  % Final   Monocytes Absolute 07/04/2022 0.5  0.1 - 1.0 K/uL Final   Eosinophils Relative 07/04/2022 0  % Final   Eosinophils Absolute 07/04/2022 0.0  0.0 - 0.5 K/uL Final   Basophils Relative 07/04/2022 0  % Final   Basophils Absolute 07/04/2022 0.0  0.0 - 0.1 K/uL Final   Immature Granulocytes 07/04/2022 0  % Final   Abs Immature Granulocytes 07/04/2022 0.01  0.00 - 0.07 K/uL Final   Performed at [facility], [place] [address] [place], [place] [address]   I-stat hCG, quantitative 07/04/2022 <5.0  <5 mIU/mL Final   Comment 3 07/04/2022          Final   Comment:   GEST. AGE      CONC.  (mIU/mL)   <=1 WEEK  5 - 50     2 WEEKS       50 - 500     3 WEEKS       100 - 10,000     4 WEEKS     1,000 - 30,000        FEMALE AND NON-PREGNANT FEMALE:     LESS THAN 5 mIU/mL   Admission on 06/14/2022, Discharged on 06/14/2022  Component Date Value Ref Range Status    Color, UA 06/14/2022 yellow  yellow Final   Clarity, UA 06/14/2022 cloudy (A)  clear Final   Glucose, UA 06/14/2022 negative  negative mg/dL Final   Bilirubin, UA 06/14/2022 negative  negative Final   Ketones, POC UA 06/14/2022 negative  negative mg/dL Final   Spec Grav, UA 06/14/2022 1.025  1.010 - 1.025 Final   Blood, UA 06/14/2022 negative  negative Final   pH, UA 06/14/2022 7.5  5.0 - 8.0 Final   Protein Ur, POC 06/14/2022 =30 (A)  negative mg/dL Final   Urobilinogen, UA 06/14/2022 0.2  0.2 or 1.0 E.U./dL Final   Nitrite, UA 06/14/2022 Negative  Negative Final   Leukocytes, UA 06/14/2022 Negative  Negative Final   Preg Test, Ur 06/14/2022 Negative  Negative Final   Specimen Description 06/14/2022 URINE, CLEAN CATCH   Final   Special Requests 06/14/2022    Final                   Value:NONE Performed at [place] [address], [place], [place] [address]    Culture 06/14/2022 MULTIPLE SPECIES PRESENT, SUGGEST RECOLLECTION (A)   Final   Report Status 06/14/2022 06/16/2022 FINAL   Final  Admission on 06/07/2022, Discharged on 06/10/2022  Component Date Value Ref Range Status   SARS Coronavirus 2 by RT PCR 06/07/2022 NEGATIVE  NEGATIVE Final   Comment: (NOTE) SARS-CoV-2 target nucleic acids are NOT DETECTED.  The SARS-CoV-2 RNA is generally detectable in upper respiratory specimens during the acute phase of infection. The lowest concentration of SARS-CoV-2 viral copies this assay can detect is 138 copies/mL. A negative result does not preclude SARS-Cov-2 infection and should not be used as the sole basis for treatment or other patient management decisions. A negative result may occur with  improper specimen collection/handling, submission of specimen other than nasopharyngeal swab, presence of viral mutation(s) within the areas targeted by this assay, and inadequate number of viral copies(<138 copies/mL). A negative result must be combined with clinical observations,  patient history, and epidemiological information. The expected result is Negative.  Fact Sheet for Patients:  EntrepreneurPulse.com.au  Fact Sheet for Healthcare Providers:  IncredibleEmployment.be  This test is no                          t yet approved or cleared by the [place] FDA and  has been authorized for detection and/or diagnosis of SARS-CoV-2 by FDA under an Emergency Use Authorization (EUA). This EUA will remain  in effect (meaning this test can be used) for the duration of the COVID-19 declaration under Section 564(b)(1) of the Act, 21 U.S.C.section 360bbb-3(b)(1), unless the authorization is terminated  or revoked sooner.       Influenza A by PCR 06/07/2022 NEGATIVE  NEGATIVE Final   Influenza B by PCR 06/07/2022 NEGATIVE  NEGATIVE Final   Comment: (NOTE) The Xpert Xpress SARS-CoV-2/FLU/RSV plus assay is intended as an aid in the diagnosis of influenza from Nasopharyngeal swab specimens  and should not be used as a sole basis for treatment. Nasal washings and aspirates are unacceptable for Xpert Xpress SARS-CoV-2/FLU/RSV testing.  Fact Sheet for Patients: BloggerCourse.com  Fact Sheet for Healthcare Providers: SeriousBroker.it  This test is not yet approved or cleared by the [place] FDA and has been authorized for detection and/or diagnosis of SARS-CoV-2 by FDA under an Emergency Use Authorization (EUA). This EUA will remain in effect (meaning this test can be used) for the duration of the COVID-19 declaration under Section 564(b)(1) of the Act, 21 U.S.C. section 360bbb-3(b)(1), unless the authorization is terminated or revoked.  Performed at [facility], [address], [place], [place] [address]    WBC 06/07/2022 5.7  4.0 - 10.5 K/uL Final   RBC 06/07/2022 4.39  3.87 - 5.11 MIL/uL Final   Hemoglobin 06/07/2022 13.2  12.0 - 15.0 g/dL Final   HCT  53/61/4431 40.4  36.0 - 46.0 % Final   MCV 06/07/2022 92.0  80.0 - 100.0 fL Final   MCH 06/07/2022 30.1  26.0 - 34.0 pg Final   MCHC 06/07/2022 32.7  30.0 - 36.0 g/dL Final   RDW 54/00/8676 12.4  11.5 - 15.5 % Final   Platelets 06/07/2022 308  150 - 400 K/uL Final   nRBC 06/07/2022 0.0  0.0 - 0.2 % Final   Neutrophils Relative % 06/07/2022 42  % Final   Neutro Abs 06/07/2022 2.4  1.7 - 7.7 K/uL Final   Lymphocytes Relative 06/07/2022 54  % Final   Lymphs Abs 06/07/2022 3.1  0.7 - 4.0 K/uL Final   Monocytes Relative 06/07/2022 4  % Final   Monocytes Absolute 06/07/2022 0.2  0.1 - 1.0 K/uL Final   Eosinophils Relative 06/07/2022 0  % Final   Eosinophils Absolute 06/07/2022 0.0  0.0 - 0.5 K/uL Final   Basophils Relative 06/07/2022 0  % Final   Basophils Absolute 06/07/2022 0.0  0.0 - 0.1 K/uL Final   Immature Granulocytes 06/07/2022 0  % Final   Abs Immature Granulocytes 06/07/2022 0.01  0.00 - 0.07 K/uL Final   Performed at [facility], [address], [place], [place] [address]   Sodium 06/07/2022 139  135 - 145 mmol/L Final   Potassium 06/07/2022 4.0  3.5 - 5.1 mmol/L Final   Chloride 06/07/2022 104  98 - 111 mmol/L Final   CO2 06/07/2022 28  22 - 32 mmol/L Final   Glucose, Bld 06/07/2022 104 (H)  70 - 99 mg/dL Final   Glucose reference range applies only to samples taken after fasting for at least 8 hours.   BUN 06/07/2022 8  6 - 20 mg/dL Final   Creatinine, Ser 06/07/2022 0.84  0.44 - 1.00 mg/dL Final   Calcium 32/67/1245 9.1  8.9 - 10.3 mg/dL Final   Total Protein 80/99/8338 6.9  6.5 - 8.1 g/dL Final   Albumin 25/02/3975 3.7  3.5 - 5.0 g/dL Final   AST 73/41/9379 19  15 - 41 U/L Final   ALT 06/07/2022 22  0 - 44 U/L Final   Alkaline Phosphatase 06/07/2022 54  38 - 126 U/L Final   Total Bilirubin 06/07/2022 0.4  0.3 - 1.2 mg/dL Final   GFR, Estimated 06/07/2022 >60  >60 mL/min Final   Comment: (NOTE) Calculated using the CKD-EPI Creatinine Equation (2021)    Anion gap  06/07/2022 7  5 - 15 Final   Performed at [facility], [address] [place], [place] [address]  Hgb A1c MFr Bld 06/07/2022 5.0  4.8 - 5.6 % Final   Comment: (NOTE) Pre diabetes:          5.7%-6.4%  Diabetes:              >6.4%  Glycemic control for   <7.0% adults with diabetes    Mean Plasma Glucose 06/07/2022 96.8  mg/dL Final   Performed at [facility], [address] [place], [place] [address]   TSH 06/07/2022 1.620  0.350 - 4.500 uIU/mL Final   Comment: Performed by a 3rd Generation assay with a functional sensitivity of <=0.01 uIU/mL. Performed at [facility], [address] [place], [place] [address]    RPR Ser Ql 06/07/2022 NON REACTIVE  NON REACTIVE Final   Performed at [facility], [address] [place], [place] [address]   Color, Urine 06/07/2022 YELLOW  YELLOW Final   APPearance 06/07/2022 HAZY (A)  CLEAR Final   Specific Gravity, Urine 06/07/2022 1.018  1.005 - 1.030 Final   pH 06/07/2022 7.0  5.0 - 8.0 Final   Glucose, UA 06/07/2022 NEGATIVE  NEGATIVE mg/dL Final   Hgb urine dipstick 06/07/2022 NEGATIVE  NEGATIVE Final   Bilirubin Urine 06/07/2022 NEGATIVE  NEGATIVE Final   Ketones, ur 06/07/2022 NEGATIVE  NEGATIVE mg/dL Final   Protein, ur 24/06/7352 NEGATIVE  NEGATIVE mg/dL Final   Nitrite 29/92/4268 NEGATIVE  NEGATIVE Final   Leukocytes,Ua 06/07/2022 NEGATIVE  NEGATIVE Final   Performed at [facility], [address] [place], [place] [address]   Cholesterol 06/07/2022 164  0 - 200 mg/dL Final   Triglycerides 22/29/7989 158 (H)  <150 mg/dL Final   HDL 21/19/4174 44  >40 mg/dL Final   Total CHOL/HDL Ratio 06/07/2022 3.7  RATIO Final   VLDL 06/07/2022 32  0 - 40 mg/dL Final   LDL Cholesterol 06/07/2022 88  0 - 99 mg/dL Final   Comment:        Total Cholesterol/HDL:CHD Risk Coronary Heart Disease Risk Table                     Men   Women  1/2 Average Risk   3.4   3.3  Average Risk       5.0   4.4  2 X Average Risk    9.6   7.1  3 X Average Risk  23.4   11.0        Use the calculated Patient Ratio above and the CHD Risk Table to determine the patient's CHD Risk.        ATP III CLASSIFICATION (LDL):  <100     mg/dL   Optimal  081-448  mg/dL   Near or Above                    Optimal  130-159  mg/dL   Borderline  185-631  mg/dL   High  >497     mg/dL   Very High Performed at [facility], [address] [place], [place] [address]    HIV Screen 4th Generation wRfx 06/07/2022 Non Reactive  Non Reactive Final   Performed at [facility], [address], [place], [place] [address]   SARSCOV2ONAVIRUS 2 AG 06/07/2022 NEGATIVE  NEGATIVE Final   Comment: (NOTE) SARS-CoV-2 antigen NOT DETECTED.   Negative results are presumptive.  Negative results do not preclude SARS-CoV-2 infection and should not be used as the sole  basis for treatment or other patient management decisions, including infection  control decisions, particularly in the presence of clinical signs and  symptoms consistent with COVID-19, or in those who have been in contact with the virus.  Negative results must be combined with clinical observations, patient history, and epidemiological information. The expected result is Negative.  Fact Sheet for Patients: https://www.jennings-kim.com/  Fact Sheet for Healthcare Providers: https://alexander-rogers.biz/  This test is not yet approved or cleared by the [place] FDA and  has been authorized for detection and/or diagnosis of SARS-CoV-2 by FDA under an Emergency Use Authorization (EUA).  This EUA will remain in effect (meaning this test can be used) for the duration of  the COV                          ID-19 declaration under Section 564(b)(1) of the Act, 21 U.S.C. section 360bbb-3(b)(1), unless the authorization is terminated or revoked sooner.     POC Amphetamine UR 06/07/2022 None Detected  NONE DETECTED (Cut Off Level 1000 ng/mL) Final    POC Secobarbital (BAR) 06/07/2022 None Detected  NONE DETECTED (Cut Off Level 300 ng/mL) Final   POC Buprenorphine (BUP) 06/07/2022 None Detected  NONE DETECTED (Cut Off Level 10 ng/mL) Final   POC Oxazepam (BZO) 06/07/2022 None Detected  NONE DETECTED (Cut Off Level 300 ng/mL) Final   POC Cocaine UR 06/07/2022 None Detected  NONE DETECTED (Cut Off Level 300 ng/mL) Final   POC Methamphetamine UR 06/07/2022 None Detected  NONE DETECTED (Cut Off Level 1000 ng/mL) Final   POC Morphine 06/07/2022 None Detected  NONE DETECTED (Cut Off Level 300 ng/mL) Final   POC Methadone UR 06/07/2022 None Detected  NONE DETECTED (Cut Off Level 300 ng/mL) Final   POC Oxycodone UR 06/07/2022 None Detected  NONE DETECTED (Cut Off Level 100 ng/mL) Final   POC Marijuana UR 06/07/2022 None Detected  NONE DETECTED (Cut Off Level 50 ng/mL) Final   Preg Test, Ur 06/08/2022 NEGATIVE  NEGATIVE Final   Comment:        THE SENSITIVITY OF THIS METHODOLOGY IS >20 mIU/mL. Performed at [facility], [address] [place], [place] [address]    Preg Test, Ur 06/08/2022 NEGATIVE  NEGATIVE Final   Comment:        THE SENSITIVITY OF THIS METHODOLOGY IS >24 mIU/mL     Blood Alcohol level:  Lab Results  Component Value Date   ETH <10 07/04/2022   ETH <10 02/07/2022    Metabolic Disorder Labs: Lab Results  Component Value Date   HGBA1C 5.7 (H) 10/15/2022   MPG 117 10/15/2022   MPG 96.8 06/07/2022   No results found for: "PROLACTIN" Lab Results  Component Value Date   CHOL 181 07/25/2022   TRIG 118 07/25/2022   HDL 45 07/25/2022   CHOLHDL 4.0 07/25/2022   VLDL 24 07/25/2022   LDLCALC 112 (H) 07/25/2022   LDLCALC 88 06/07/2022    Therapeutic Lab Levels: No results found for: "LITHIUM" No results found for: "VALPROATE" No results found for: "CBMZ"  Physical Findings   GAD-7    Flowsheet Row Office Visit from 12/17/2021 in [facility] for [facility]  Total GAD-7 Score 17       PHQ2-9    Flowsheet Row ED from 10/07/2022 in [facility] ED from 06/07/2022 in [facility] Office Visit from 12/17/2021 in [facility] for [facility]  Healthcare at [facility] Total Score 0 1 2  PHQ-9 Total Score -- 2 8      [place] ED from 10/07/2022 in [facility] Most recent reading at 11/07/2022  9:32 AM ED from 10/07/2022 in [facility] Emergency Department at [facility] Most recent reading at 10/07/2022 12:18 PM ED from 07/25/2022 in [facility] Most recent reading at 10/06/2022  9:38 AM  C-SSRS RISK CATEGORY No Risk No Risk No Risk        Musculoskeletal  Strength & Muscle Tone: within normal limits Gait & Station: normal Patient leans: N/A  Psychiatric Specialty Exam  Presentation  General Appearance:  Appropriate for Environment  Eye Contact: Good  Speech: Clear and Coherent; Normal Rate  Speech Volume: Normal  Handedness: Right   Mood and Affect  Mood: Euthymic  Affect: Full Range   Thought Process  Thought Processes: Coherent; Goal Directed  Descriptions of Associations:Intact  Orientation:Full (Time, Place and Person)  Thought Content:Logical  Diagnosis of Schizophrenia or Schizoaffective disorder in past: Yes  Duration of Psychotic Symptoms: Greater than six months   Hallucinations:Hallucinations: None  Ideas of Reference:None  Suicidal Thoughts:Suicidal Thoughts: No  Homicidal Thoughts:Homicidal Thoughts: No   Sensorium  Memory: Immediate Good  Judgment: Good  Insight: Good   Executive Functions  Concentration: Good  Attention Span: Good  Recall: Good  Fund of Knowledge: Good  Language: Good   Psychomotor Activity  Psychomotor Activity: Psychomotor Activity: Normal   Assets  Assets: Communication Skills; Desire for Improvement; Financial Resources/Insurance;  Leisure Time; Physical Health; Resilience; Social Support   Sleep  Sleep: Sleep: Good   No data recorded  Physical Exam  Physical Exam Constitutional:      General: She is not in acute distress.    Appearance: She is not ill-appearing, toxic-appearing or diaphoretic.  Eyes:     General: No scleral icterus. Cardiovascular:     Rate and Rhythm: Normal rate.  Pulmonary:     Effort: Pulmonary effort is normal. No respiratory distress.  Neurological:     Mental Status: She is alert and oriented to person, place, and time.  Psychiatric:        Attention and Perception: Attention and perception normal.        Mood and Affect: Mood and affect normal.        Speech: Speech normal.        Behavior: Behavior normal. Behavior is cooperative.        Thought Content: Thought content normal.    Review of Systems  Constitutional:  Negative for chills and fever.  Respiratory:  Negative for shortness of breath.   Cardiovascular:  Negative for chest pain and palpitations.  Gastrointestinal:  Negative for abdominal pain.  Neurological:  Negative for headaches.   Blood pressure 107/83, pulse 98, temperature 97.9 F (36.6 C), temperature source Oral, resp. rate 20, height 5\' 1"  (1.549 m), weight 198 lb (89.8 kg), SpO2 96 %. Body mass index is 37.41 kg/m.  Treatment Plan Summary: Plan Patient remains psychiatrically cleared. Disposition is pending placement.  Tharon Aquas, NP 11/13/2022 8:51 AM

## 2022-11-13 NOTE — ED Notes (Signed)
Pt A&O x 4, taking a shower at present, no distress noted.  Monitoring for safety. Urinalysis obtained and sent to lab.

## 2022-11-13 NOTE — ED Notes (Signed)
NP Nicole [name] at bedside to speak with pt about vaginal burning and pain, with white discharge.

## 2022-11-14 DIAGNOSIS — N9489 Other specified conditions associated with female genital organs and menstrual cycle: Secondary | ICD-10-CM | POA: Diagnosis not present

## 2022-11-14 DIAGNOSIS — E118 Type 2 diabetes mellitus with unspecified complications: Secondary | ICD-10-CM | POA: Diagnosis not present

## 2022-11-14 DIAGNOSIS — F209 Schizophrenia, unspecified: Secondary | ICD-10-CM | POA: Diagnosis not present

## 2022-11-14 DIAGNOSIS — F333 Major depressive disorder, recurrent, severe with psychotic symptoms: Secondary | ICD-10-CM | POA: Diagnosis not present

## 2022-11-14 DIAGNOSIS — N390 Urinary tract infection, site not specified: Secondary | ICD-10-CM | POA: Diagnosis not present

## 2022-11-14 DIAGNOSIS — E039 Hypothyroidism, unspecified: Secondary | ICD-10-CM | POA: Diagnosis not present

## 2022-11-14 DIAGNOSIS — F17200 Nicotine dependence, unspecified, uncomplicated: Secondary | ICD-10-CM | POA: Diagnosis not present

## 2022-11-14 DIAGNOSIS — F419 Anxiety disorder, unspecified: Secondary | ICD-10-CM | POA: Diagnosis not present

## 2022-11-14 DIAGNOSIS — Z1152 Encounter for screening for COVID-19: Secondary | ICD-10-CM | POA: Diagnosis not present

## 2022-11-14 NOTE — Progress Notes (Signed)
Pt is asleep. Respirations are even and unlabored. No signs of acute distress noted. Staff will monitor for pt's safety. 

## 2022-11-14 NOTE — Progress Notes (Signed)
Pt is currently using the phone. No distress noted or concerns voiced. Pt has been appropriate this shift. Staff will monitor for pt's safety.

## 2022-11-14 NOTE — ED Provider Notes (Signed)
Behavioral Health Progress Note  Date and Time: 11/14/2022 10:58 AM Name: Nicole [name] MRN:  [id number]  Nicole [name] is a 32 y/o female with PMH of SCZ unspecified, MDD with psychosis, who presented voluntarily to [facility] (07/23/2022) via [facility] after a verbal altercation with Nicole [name] (not patient's legal guardian) for transient SI. This is patient's fourth visit to [facility] for similar concerns this year. Patient has been dismissed from previous group home due to threatening SI and HI, then leaving the group home. 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  Subjective:    On reassessment, pt is awake, sitting up on reclining chairbed, alert and orientedx3, in no acute distress, non toxic appearing. She endorses euthymic mood. She denies suicidal, homicidal or violent ideations. She denies auditory visual hallucinations or paranoia. Objectively, there is no evidence of agitation, aggression, distractibility or internal preoccupation. No delusions or paranoia elicited.  She reports vaginal discharge is improving following the fluconazole 150mg  which was given on 11/12/22 (although per NP note from earlier this morning had reported was without relief). UA was obtained on 11/13/22, cloudy, otherwise unremarkable. Hydrocortisone cream was given overnight to manage symptoms.   Diagnosis:  Final diagnoses:  Tobacco use disorder  Schizophrenia, unspecified type ([place])  MDD (major depressive disorder), recurrent episode, moderate ([place])  Hypothyroidism, unspecified type    Total Time spent with patient: 15 minutes  Past Psychiatric History: SCZ unspecified, MDD with psychosis, anxiety, depression, tobacco use Past Medical History: Hypothyroidism Family History: Hypertension - father diabetes Family Psychiatric  History: None reported Social History: Currently seeking placement  Additional Social History:                         Sleep: Good  Appetite:   Good  Current Medications:  Current Facility-Administered Medications  Medication Dose Route Frequency Provider Last Rate Last Admin   acetaminophen (TYLENOL) tablet 650 mg  650 mg Oral Q6H PRN Haynes Kerns, NP   650 mg at 11/04/22 2154   alum & mag hydroxide-simeth (MAALOX/MYLANTA) 200-200-20 MG/5ML suspension 30 mL  30 mL Oral Q4H PRN Haynes Kerns, NP   30 mL at 11/11/22 0052   ARIPiprazole ER (ABILIFY MAINTENA) 400 MG prefilled syringe 400 mg  400 mg Intramuscular Q28 days Merrily Brittle, DO   400 mg at 10/25/22 0920   cetaphil lotion   Topical PRN Merrily Brittle, DO       fluticasone (FLONASE) 50 MCG/ACT nasal spray 1 spray  1 spray Each Nare QHS Tharon Aquas, NP   1 spray at 11/13/22 2112   hydrocortisone cream 1 %   Topical QID PRN Onuoha, Chinwendu V, NP   Given at 11/14/22 7619   hydrOXYzine (ATARAX) tablet 25 mg  25 mg Oral TID PRN Christene Slates, MD   25 mg at 11/12/22 2132   ketoconazole (NIZORAL) 2 % cream   Topical BID Merrily Brittle, DO   Given at 11/14/22 5093   levothyroxine (SYNTHROID) tablet 100 mcg  100 mcg Oral Once Byungura, Veronique M, NP       levothyroxine (SYNTHROID) tablet 100 mcg  100 mcg Oral Q0600 Haynes Kerns, NP   100 mcg at 11/14/22 0615   lidocaine (LIDODERM) 5 % 1 patch  1 patch Transdermal Q24H Merrily Brittle, DO   1 patch at 11/14/22 0907   loratadine (CLARITIN) tablet 10 mg  10 mg Oral Daily Christene Slates, MD   10 mg at 11/14/22 [phone]  magnesium hydroxide (MILK OF MAGNESIA) suspension 30 mL  30 mL Oral Daily PRN Rayburn Go, Veronique M, NP   30 mL at 10/12/22 2145   metFORMIN (GLUCOPHAGE) tablet 500 mg  500 mg Oral Q breakfast Carrion-Carrero, Karle Starch, MD   500 mg at 11/14/22 6440   nicotine (NICODERM CQ - dosed in mg/24 hours) patch 14 mg  14 mg Transdermal Daily PRN Carrion-Carrero, Karle Starch, MD       ondansetron (ZOFRAN-ODT) disintegrating tablet 4 mg  4 mg Oral Q8H PRN Carrion-Carrero, Margely, MD   4 mg at  11/01/22 2246   Oxcarbazepine (TRILEPTAL) tablet 300 mg  300 mg Oral BID Rayburn Go, Veronique M, NP   300 mg at 11/14/22 0906   pantoprazole (PROTONIX) EC tablet 40 mg  40 mg Oral Daily Carrion-Carrero, Karle Starch, MD   40 mg at 11/14/22 0906   polyethylene glycol (MIRALAX / GLYCOLAX) packet 17 g  17 g Oral Daily Princess Bruins, DO   17 g at 11/14/22 3474   QUEtiapine (SEROQUEL) tablet 400 mg  400 mg Oral BID Rayburn Go, Veronique M, NP   400 mg at 11/14/22 2595   sertraline (ZOLOFT) tablet 150 mg  150 mg Oral Daily Carrion-Carrero, Margely, MD   150 mg at 11/14/22 0906   sodium chloride (OCEAN) 0.65 % nasal spray 1 spray  1 spray Each Nare Daily Princess Bruins, DO   1 spray at 11/14/22 0906   traZODone (DESYREL) tablet 100 mg  100 mg Oral QHS Olin Pia M, NP   100 mg at 11/13/22 2113   traZODone (DESYREL) tablet 50 mg  50 mg Oral QHS PRN Marlou Sa, NP   50 mg at 11/13/22 2119   valACYclovir (VALTREX) tablet 500 mg  500 mg Oral Daily Carrion-Carrero, Karle Starch, MD   500 mg at 11/14/22 0906   zinc oxide (BALMEX) 11.3 % cream   Topical PRN Princess Bruins, DO       Current Outpatient Medications  Medication Sig Dispense Refill   ABILIFY MAINTENA 400 MG PRSY prefilled syringe 400 mg every 28 (twenty-eight) days.     cetirizine (ZYRTEC) 10 MG tablet Take 10 mg by mouth daily.     cyclobenzaprine (FLEXERIL) 10 MG tablet Take 1 tablet (10 mg total) by mouth 2 (two) times daily as needed for muscle spasms. 20 tablet 0   fluticasone (FLONASE) 50 MCG/ACT nasal spray Place 1 spray into both nostrils daily.     meloxicam (MOBIC) 15 MG tablet Take 15 mg by mouth daily.     nitrofurantoin, macrocrystal-monohydrate, (MACROBID) 100 MG capsule Take 1 capsule (100 mg total) by mouth 2 (two) times daily. 10 capsule 0   ondansetron (ZOFRAN-ODT) 4 MG disintegrating tablet Take 1 tablet (4 mg total) by mouth every 8 (eight) hours as needed for nausea or vomiting. 20 tablet 0   Oxcarbazepine (TRILEPTAL) 300  MG tablet Take 1 tablet (300 mg total) by mouth 2 (two) times daily. 60 tablet 0   QUEtiapine (SEROQUEL) 400 MG tablet Take 1 tablet (400 mg total) by mouth 2 (two) times daily. 60 tablet 0   sertraline (ZOLOFT) 50 MG tablet Take 3 tablets (150 mg total) by mouth in the morning. 90 tablet 0   traZODone (DESYREL) 100 MG tablet Take 1 tablet (100 mg total) by mouth at bedtime. 30 tablet 0   valACYclovir (VALTREX) 500 MG tablet Take 500 mg by mouth daily.      Labs  Lab Results:  Admission on 10/07/2022  Component Date Value Ref  Range Status   Hgb A1c MFr Bld 10/15/2022 5.7 (H)  4.8 - 5.6 % Final   Comment: (NOTE)         Prediabetes: 5.7 - 6.4         Diabetes: >6.4         Glycemic control for adults with diabetes: <7.0    Mean Plasma Glucose 10/15/2022 117  mg/dL Final   Comment: (NOTE) Performed At: [facility] [place], [place] [id number] Rush Farmer MD GE:[id number]    Color, Urine 11/13/2022 YELLOW  YELLOW Final   APPearance 11/13/2022 CLOUDY (A)  CLEAR Final   Specific Gravity, Urine 11/13/2022 1.023  1.005 - 1.030 Final   pH 11/13/2022 6.0  5.0 - 8.0 Final   Glucose, UA 11/13/2022 NEGATIVE  NEGATIVE mg/dL Final   Hgb urine dipstick 11/13/2022 NEGATIVE  NEGATIVE Final   Bilirubin Urine 11/13/2022 NEGATIVE  NEGATIVE Final   Ketones, ur 11/13/2022 NEGATIVE  NEGATIVE mg/dL Final   Protein, ur 11/13/2022 NEGATIVE  NEGATIVE mg/dL Final   Nitrite 11/13/2022 NEGATIVE  NEGATIVE Final   Leukocytes,Ua 11/13/2022 NEGATIVE  NEGATIVE Final   Performed at [facility], [place] [address], [place], [place] [address]  Office Visit on 10/21/2022  Component Date Value Ref Range Status   SARSCOV2ONAVIRUS 2 AG 11/01/2022 NEGATIVE  NEGATIVE Final   Comment: (NOTE) SARS-CoV-2 antigen NOT DETECTED.   Negative results are presumptive.  Negative results do not preclude SARS-CoV-2 infection and should not be used as the sole basis for treatment or other patient  management decisions, including infection  control decisions, particularly in the presence of clinical signs and  symptoms consistent with COVID-19, or in those who have been in contact with the virus.  Negative results must be combined with clinical observations, patient history, and epidemiological information. The expected result is Negative.  Fact Sheet for Patients: HandmadeRecipes.com.cy  Fact Sheet for Healthcare Providers: FuneralLife.at  This test is not yet approved or cleared by the [place] FDA and  has been authorized for detection and/or diagnosis of SARS-CoV-2 by FDA under an Emergency Use Authorization (EUA).  This EUA will remain in effect (meaning this test can be used) for the duration of  the COV                          ID-19 declaration under Section 564(b)(1) of the Act, 21 U.S.C. section 360bbb-3(b)(1), unless the authorization is terminated or revoked sooner.    Admission on 10/07/2022, Discharged on 10/07/2022  Component Date Value Ref Range Status   Color, Urine 10/07/2022 YELLOW  YELLOW Final   APPearance 10/07/2022 HAZY (A)  CLEAR Final   Specific Gravity, Urine 10/07/2022 1.011  1.005 - 1.030 Final   pH 10/07/2022 7.0  5.0 - 8.0 Final   Glucose, UA 10/07/2022 NEGATIVE  NEGATIVE mg/dL Final   Hgb urine dipstick 10/07/2022 SMALL (A)  NEGATIVE Final   Bilirubin Urine 10/07/2022 NEGATIVE  NEGATIVE Final   Ketones, ur 10/07/2022 NEGATIVE  NEGATIVE mg/dL Final   Protein, ur 10/07/2022 NEGATIVE  NEGATIVE mg/dL Final   Nitrite 10/07/2022 NEGATIVE  NEGATIVE Final   Leukocytes,Ua 10/07/2022 LARGE (A)  NEGATIVE Final   RBC / HPF 10/07/2022 0-5  0 - 5 RBC/hpf Final   WBC, UA 10/07/2022 0-5  0 - 5 WBC/hpf Final   Bacteria, UA 10/07/2022 FEW (A)  NONE SEEN Final   Squamous Epithelial / HPF 10/07/2022 11-20  0 - 5  Final   Performed at [facility], [address], [place], [place] [address]   I-stat hCG,  quantitative 10/07/2022 <5.0  <5 mIU/mL Final   Comment 3 10/07/2022          Final   Comment:   GEST. AGE      CONC.  (mIU/mL)   <=1 WEEK        5 - 50     2 WEEKS       50 - 500     3 WEEKS       100 - 10,000     4 WEEKS     1,000 - 30,000        FEMALE AND NON-PREGNANT FEMALE:     LESS THAN 5 mIU/mL    Sodium 10/07/2022 138  135 - 145 mmol/L Final   Potassium 10/07/2022 4.3  3.5 - 5.1 mmol/L Final   Chloride 10/07/2022 104  98 - 111 mmol/L Final   CO2 10/07/2022 28  22 - 32 mmol/L Final   Glucose, Bld 10/07/2022 90  70 - 99 mg/dL Final   Glucose reference range applies only to samples taken after fasting for at least 8 hours.   BUN 10/07/2022 8  6 - 20 mg/dL Final   Creatinine, Ser 10/07/2022 0.96  0.44 - 1.00 mg/dL Final   Calcium 94/49/6759 9.1  8.9 - 10.3 mg/dL Final   GFR, Estimated 10/07/2022 >60  >60 mL/min Final   Comment: (NOTE) Calculated using the CKD-EPI Creatinine Equation (2021)    Anion gap 10/07/2022 6  5 - 15 Final   Performed at [facility], [address] [place], [place] [address]   WBC 10/07/2022 5.8  4.0 - 10.5 K/uL Final   RBC 10/07/2022 4.36  3.87 - 5.11 MIL/uL Final   Hemoglobin 10/07/2022 12.9  12.0 - 15.0 g/dL Final   HCT 66/59/9357 40.0  36.0 - 46.0 % Final   MCV 10/07/2022 91.7  80.0 - 100.0 fL Final   MCH 10/07/2022 29.6  26.0 - 34.0 pg Final   MCHC 10/07/2022 32.3  30.0 - 36.0 g/dL Final   RDW 01/77/9390 12.4  11.5 - 15.5 % Final   Platelets 10/07/2022 295  150 - 400 K/uL Final   nRBC 10/07/2022 0.0  0.0 - 0.2 % Final   Performed at [facility], [address], [place], [place] [address]   SARS Coronavirus 2 by RT PCR 10/07/2022 NEGATIVE  NEGATIVE Final   Comment: (NOTE) SARS-CoV-2 target nucleic acids are NOT DETECTED.  The SARS-CoV-2 RNA is generally detectable in upper respiratory specimens during the acute phase of infection. The lowest concentration of SARS-CoV-2 viral copies this assay can detect is 138 copies/mL. A  negative result does not preclude SARS-Cov-2 infection and should not be used as the sole basis for treatment or other patient management decisions. A negative result may occur with  improper specimen collection/handling, submission of specimen other than nasopharyngeal swab, presence of viral mutation(s) within the areas targeted by this assay, and inadequate number of viral copies(<138 copies/mL). A negative result must be combined with clinical observations, patient history, and epidemiological information. The expected result is Negative.  Fact Sheet for Patients:  BloggerCourse.com  Fact Sheet for Healthcare Providers:  SeriousBroker.it  This test is no                          t yet approved or cleared by  the [employer] and  has been authorized for detection and/or diagnosis of SARS-CoV-2 by FDA under an Emergency Use Authorization (EUA). This EUA will remain  in effect (meaning this test can be used) for the duration of the COVID-19 declaration under Section 564(b)(1) of the Act, 21 U.S.C.section 360bbb-3(b)(1), unless the authorization is terminated  or revoked sooner.       Influenza A by PCR 10/07/2022 NEGATIVE  NEGATIVE Final   Influenza B by PCR 10/07/2022 NEGATIVE  NEGATIVE Final   Comment: (NOTE) The Xpert Xpress SARS-CoV-2/FLU/RSV plus assay is intended as an aid in the diagnosis of influenza from Nasopharyngeal swab specimens and should not be used as a sole basis for treatment. Nasal washings and aspirates are unacceptable for Xpert Xpress SARS-CoV-2/FLU/RSV testing.  Fact Sheet for Patients: BloggerCourse.com  Fact Sheet for Healthcare Providers: SeriousBroker.it  This test is not yet approved or cleared by the [place] FDA and has been authorized for detection and/or diagnosis of SARS-CoV-2 by FDA under an Emergency Use Authorization (EUA). This  EUA will remain in effect (meaning this test can be used) for the duration of the COVID-19 declaration under Section 564(b)(1) of the Act, 21 U.S.C. section 360bbb-3(b)(1), unless the authorization is terminated or revoked.     Resp Syncytial Virus by PCR 10/07/2022 NEGATIVE  NEGATIVE Final   Comment: (NOTE) Fact Sheet for Patients: BloggerCourse.com  Fact Sheet for Healthcare Providers: SeriousBroker.it  This test is not yet approved or cleared by the [place] FDA and has been authorized for detection and/or diagnosis of SARS-CoV-2 by FDA under an Emergency Use Authorization (EUA). This EUA will remain in effect (meaning this test can be used) for the duration of the COVID-19 declaration under Section 564(b)(1) of the Act, 21 U.S.C. section 360bbb-3(b)(1), unless the authorization is terminated or revoked.  Performed at [facility], [address] [place], [place] [address]    Specimen Description 10/07/2022 URINE, CLEAN CATCH   Final   Special Requests 10/07/2022    Final                   Value:NONE Performed at [facility], [address] [place], [place] [address]    Culture 10/07/2022 MULTIPLE SPECIES PRESENT, SUGGEST RECOLLECTION (A)   Final   Report Status 10/07/2022 10/08/2022 FINAL   Final  Admission on 07/25/2022, Discharged on 10/07/2022  Component Date Value Ref Range Status   SARS Coronavirus 2 by RT PCR 07/25/2022 NEGATIVE  NEGATIVE Final   Comment: (NOTE) SARS-CoV-2 target nucleic acids are NOT DETECTED.  The SARS-CoV-2 RNA is generally detectable in upper respiratory specimens during the acute phase of infection. The lowest concentration of SARS-CoV-2 viral copies this assay can detect is 138 copies/mL. A negative result does not preclude SARS-Cov-2 infection and should not be used as the sole basis for treatment or other patient management decisions. A negative result may occur with   improper specimen collection/handling, submission of specimen other than nasopharyngeal swab, presence of viral mutation(s) within the areas targeted by this assay, and inadequate number of viral copies(<138 copies/mL). A negative result must be combined with clinical observations, patient history, and epidemiological information. The expected result is Negative.  Fact Sheet for Patients:  BloggerCourse.com  Fact Sheet for Healthcare Providers:  SeriousBroker.it  This test is no                          t yet approved  or cleared by the [place] and  has been authorized for detection and/or diagnosis of SARS-CoV-2 by FDA under an Emergency Use Authorization (EUA). This EUA will remain  in effect (meaning this test can be used) for the duration of the COVID-19 declaration under Section 564(b)(1) of the Act, 21 U.S.C.section 360bbb-3(b)(1), unless the authorization is terminated  or revoked sooner.       Influenza A by PCR 07/25/2022 NEGATIVE  NEGATIVE Final   Influenza B by PCR 07/25/2022 NEGATIVE  NEGATIVE Final   Comment: (NOTE) The Xpert Xpress SARS-CoV-2/FLU/RSV plus assay is intended as an aid in the diagnosis of influenza from Nasopharyngeal swab specimens and should not be used as a sole basis for treatment. Nasal washings and aspirates are unacceptable for Xpert Xpress SARS-CoV-2/FLU/RSV testing.  Fact Sheet for Patients: BloggerCourse.com  Fact Sheet for Healthcare Providers: SeriousBroker.it  This test is not yet approved or cleared by the [place] FDA and has been authorized for detection and/or diagnosis of SARS-CoV-2 by FDA under an Emergency Use Authorization (EUA). This EUA will remain in effect (meaning this test can be used) for the duration of the COVID-19 declaration under Section 564(b)(1) of the Act, 21 U.S.C. section 360bbb-3(b)(1), unless  the authorization is terminated or revoked.  Performed at [facility], [address] [place], [place] [address]    WBC 07/25/2022 8.3  4.0 - 10.5 K/uL Final   RBC 07/25/2022 4.44  3.87 - 5.11 MIL/uL Final   Hemoglobin 07/25/2022 13.7  12.0 - 15.0 g/dL Final   HCT 19/14/7829 40.2  36.0 - 46.0 % Final   MCV 07/25/2022 90.5  80.0 - 100.0 fL Final   MCH 07/25/2022 30.9  26.0 - 34.0 pg Final   MCHC 07/25/2022 34.1  30.0 - 36.0 g/dL Final   RDW 56/21/3086 12.2  11.5 - 15.5 % Final   Platelets 07/25/2022 248  150 - 400 K/uL Final   nRBC 07/25/2022 0.0  0.0 - 0.2 % Final   Neutrophils Relative % 07/25/2022 43  % Final   Neutro Abs 07/25/2022 3.6  1.7 - 7.7 K/uL Final   Lymphocytes Relative 07/25/2022 52  % Final   Lymphs Abs 07/25/2022 4.2 (H)  0.7 - 4.0 K/uL Final   Monocytes Relative 07/25/2022 5  % Final   Monocytes Absolute 07/25/2022 0.4  0.1 - 1.0 K/uL Final   Eosinophils Relative 07/25/2022 0  % Final   Eosinophils Absolute 07/25/2022 0.0  0.0 - 0.5 K/uL Final   Basophils Relative 07/25/2022 0  % Final   Basophils Absolute 07/25/2022 0.0  0.0 - 0.1 K/uL Final   Immature Granulocytes 07/25/2022 0  % Final   Abs Immature Granulocytes 07/25/2022 0.02  0.00 - 0.07 K/uL Final   Performed at [facility], [address], [place], [place] [address]   Sodium 07/25/2022 138  135 - 145 mmol/L Final   Potassium 07/25/2022 4.0  3.5 - 5.1 mmol/L Final   Chloride 07/25/2022 104  98 - 111 mmol/L Final   CO2 07/25/2022 29  22 - 32 mmol/L Final   Glucose, Bld 07/25/2022 83  70 - 99 mg/dL Final   Glucose reference range applies only to samples taken after fasting for at least 8 hours.   BUN 07/25/2022 11  6 - 20 mg/dL Final   Creatinine, Ser 07/25/2022 0.97  0.44 - 1.00 mg/dL Final   Calcium 96/29/5284 9.2  8.9 - 10.3 mg/dL Final   Total Protein 13/24/4010 7.0  6.5 - 8.1 g/dL Final   Albumin 16/10/960410/09/2022 3.8  3.5 - 5.0 g/dL Final   AST 54/09/811910/09/2022 18  15 - 41 U/L Final   ALT  07/25/2022 22  0 - 44 U/L Final   Alkaline Phosphatase 07/25/2022 64  38 - 126 U/L Final   Total Bilirubin 07/25/2022 0.2 (L)  0.3 - 1.2 mg/dL Final   GFR, Estimated 07/25/2022 >60  >60 mL/min Final   Comment: (NOTE) Calculated using the CKD-EPI Creatinine Equation (2021)    Anion gap 07/25/2022 5  5 - 15 Final   Performed at [facility], [address] [place], [place] [id number]   POC Amphetamine UR 07/25/2022 None Detected  NONE DETECTED (Cut Off Level 1000 ng/mL) Preliminary   POC Secobarbital (BAR) 07/25/2022 None Detected  NONE DETECTED (Cut Off Level 300 ng/mL) Preliminary   POC Buprenorphine (BUP) 07/25/2022 None Detected  NONE DETECTED (Cut Off Level 10 ng/mL) Preliminary   POC Oxazepam (BZO) 07/25/2022 None Detected  NONE DETECTED (Cut Off Level 300 ng/mL) Preliminary   POC Cocaine UR 07/25/2022 None Detected  NONE DETECTED (Cut Off Level 300 ng/mL) Preliminary   POC Methamphetamine UR 07/25/2022 None Detected  NONE DETECTED (Cut Off Level 1000 ng/mL) Preliminary   POC Morphine 07/25/2022 None Detected  NONE DETECTED (Cut Off Level 300 ng/mL) Preliminary   POC Methadone UR 07/25/2022 None Detected  NONE DETECTED (Cut Off Level 300 ng/mL) Preliminary   POC Oxycodone UR 07/25/2022 None Detected  NONE DETECTED (Cut Off Level 100 ng/mL) Preliminary   POC Marijuana UR 07/25/2022 None Detected  NONE DETECTED (Cut Off Level 50 ng/mL) Preliminary   SARSCOV2ONAVIRUS 2 AG 07/25/2022 NEGATIVE  NEGATIVE Final   Comment: (NOTE) SARS-CoV-2 antigen NOT DETECTED.   Negative results are presumptive.  Negative results do not preclude SARS-CoV-2 infection and should not be used as the sole basis for treatment or other patient management decisions, including infection  control decisions, particularly in the presence of clinical signs and  symptoms consistent with COVID-19, or in those who have been in contact with the virus.  Negative results must be combined with clinical observations,  patient history, and epidemiological information. The expected result is Negative.  Fact Sheet for Patients: https://www.jennings-kim.com/https://www.fda.gov/media/141569/download  Fact Sheet for Healthcare Providers: https://alexander-rogers.biz/https://www.fda.gov/media/141568/download  This test is not yet approved or cleared by the [place] FDA and  has been authorized for detection and/or diagnosis of SARS-CoV-2 by FDA under an Emergency Use Authorization (EUA).  This EUA will remain in effect (meaning this test can be used) for the duration of  the COV                          ID-19 declaration under Section 564(b)(1) of the Act, 21 U.S.C. section 360bbb-3(b)(1), unless the authorization is terminated or revoked sooner.     Cholesterol 07/25/2022 181  0 - 200 mg/dL Final   Triglycerides 95/62/130810/09/2022 118  <150 mg/dL Final   HDL 65/78/469610/09/2022 45  >40 mg/dL Final   Total CHOL/HDL Ratio 07/25/2022 4.0  RATIO Final   VLDL 07/25/2022 24  0 - 40 mg/dL Final   LDL Cholesterol 07/25/2022 112 (H)  0 - 99 mg/dL Final   Comment:        Total Cholesterol/HDL:CHD Risk Coronary Heart Disease Risk Table                     Men   Women  1/2 Average Risk  3.4   3.3  Average Risk       5.0   4.4  2 X Average Risk   9.6   7.1  3 X Average Risk  23.4   11.0        Use the calculated Patient Ratio above and the CHD Risk Table to determine the patient's CHD Risk.        ATP III CLASSIFICATION (LDL):  <100     mg/dL   Optimal  161-096  mg/dL   Near or Above                    Optimal  130-159  mg/dL   Borderline  045-409  mg/dL   High  >811     mg/dL   Very High Performed at [facility], [address] [place], [place] [address]    TSH 07/25/2022 6.668 (H)  0.350 - 4.500 uIU/mL Final   Comment: Performed by a 3rd Generation assay with a functional sensitivity of <=0.01 uIU/mL. Performed at [facility], [address], [place], [place] [address]    Glucose-Capillary 07/26/2022 104 (H)  70 - 99 mg/dL Final   Glucose reference  range applies only to samples taken after fasting for at least 8 hours.   T3, Free 07/31/2022 2.3  2.0 - 4.4 pg/mL Final   Comment: (NOTE) Performed At: [facility] [address] [place], [place] [id number] Jolene Schimke MD ON:[id number]    Free T4 07/31/2022 0.60 (L)  0.61 - 1.12 ng/dL Final   Comment: (NOTE) Biotin ingestion may interfere with free T4 tests. If the results are inconsistent with the TSH level, previous test results, or the clinical presentation, then consider biotin interference. If needed, order repeat testing after stopping biotin. Performed at [facility], [address], [place], [place] [address]    Glucose-Capillary 08/29/2022 100 (H)  70 - 99 mg/dL Final   Glucose reference range applies only to samples taken after fasting for at least 8 hours.   TSH 09/05/2022 0.793  0.350 - 4.500 uIU/mL Final   Comment: Performed by a 3rd Generation assay with a functional sensitivity of <=0.01 uIU/mL. Performed at [facility], [address], [place], [place] [address]    Free T4 09/05/2022 0.73  0.61 - 1.12 ng/dL Final   Comment: (NOTE) Biotin ingestion may interfere with free T4 tests. If the results are inconsistent with the TSH level, previous test results, or the clinical presentation, then consider biotin interference. If needed, order repeat testing after stopping biotin. Performed at [facility], [address], [place], [place] [address]    Preg Test, Ur 09/06/2022 Negative  Negative Final   Preg Test, Ur 09/06/2022 NEGATIVE  NEGATIVE Final   Comment:        THE SENSITIVITY OF THIS METHODOLOGY IS >24 mIU/mL    Preg Test, Ur 09/05/2022 NEGATIVE  NEGATIVE Final   Comment:        THE SENSITIVITY OF THIS METHODOLOGY IS >24 mIU/mL    Sodium 09/13/2022 136  135 - 145 mmol/L Final   Potassium 09/13/2022 4.5  3.5 - 5.1 mmol/L Final   Chloride 09/13/2022 105  98 - 111 mmol/L Final   CO2 09/13/2022 21 (L)  22 - 32 mmol/L Final    Glucose, Bld 09/13/2022 111 (H)  70 - 99 mg/dL Final   Glucose reference range applies only to samples taken after fasting for at least  8 hours.   BUN 09/13/2022 14  6 - 20 mg/dL Final   Creatinine, Ser 09/13/2022 0.92  0.44 - 1.00 mg/dL Final   Calcium 30/86/578412/10/2021 9.1  8.9 - 10.3 mg/dL Final   GFR, Estimated 09/13/2022 >60  >60 mL/min Final   Comment: (NOTE) Calculated using the CKD-EPI Creatinine Equation (2021)    Anion gap 09/13/2022 10  5 - 15 Final   Performed at [facility], [address] [place], [place] [address]   Vitamin B-12 09/13/2022 585  180 - 914 pg/mL Final   Comment: (NOTE) This assay is not validated for testing neonatal or myeloproliferative syndrome specimens for Vitamin B12 levels. Performed at [facility], [address] [place], [place] [address]    Color, Urine 09/14/2022 YELLOW  YELLOW Final   APPearance 09/14/2022 HAZY (A)  CLEAR Final   Specific Gravity, Urine 09/14/2022 1.025  1.005 - 1.030 Final   pH 09/14/2022 5.0  5.0 - 8.0 Final   Glucose, UA 09/14/2022 NEGATIVE  NEGATIVE mg/dL Final   Hgb urine dipstick 09/14/2022 NEGATIVE  NEGATIVE Final   Bilirubin Urine 09/14/2022 NEGATIVE  NEGATIVE Final   Ketones, ur 09/14/2022 NEGATIVE  NEGATIVE mg/dL Final   Protein, ur 32/44/010212/11/2021 NEGATIVE  NEGATIVE mg/dL Final   Nitrite 72/53/664412/11/2021 NEGATIVE  NEGATIVE Final   Leukocytes,Ua 09/14/2022 TRACE (A)  NEGATIVE Final   RBC / HPF 09/14/2022 0-5  0 - 5 RBC/hpf Final   WBC, UA 09/14/2022 0-5  0 - 5 WBC/hpf Final   Bacteria, UA 09/14/2022 RARE (A)  NONE SEEN Final   Squamous Epithelial / HPF 09/14/2022 0-5  0 - 5 Final   Mucus 09/14/2022 PRESENT   Final   Performed at [facility], [address] [place], [place] [address]   Glucose-Capillary 09/16/2022 105 (H)  70 - 99 mg/dL Final   Glucose reference range applies only to samples taken after fasting for at least 8 hours.   SARS Coronavirus 2 by RT PCR 09/30/2022 NEGATIVE  NEGATIVE Final    Comment: (NOTE) SARS-CoV-2 target nucleic acids are NOT DETECTED.  The SARS-CoV-2 RNA is generally detectable in upper respiratory specimens during the acute phase of infection. The lowest concentration of SARS-CoV-2 viral copies this assay can detect is 138 copies/mL. A negative result does not preclude SARS-Cov-2 infection and should not be used as the sole basis for treatment or other patient management decisions. A negative result may occur with  improper specimen collection/handling, submission of specimen other than nasopharyngeal swab, presence of viral mutation(s) within the areas targeted by this assay, and inadequate number of viral copies(<138 copies/mL). A negative result must be combined with clinical observations, patient history, and epidemiological information. The expected result is Negative.  Fact Sheet for Patients:  BloggerCourse.comhttps://www.fda.gov/media/152166/download  Fact Sheet for Healthcare Providers:  SeriousBroker.ithttps://www.fda.gov/media/152162/download  This test is no                          t yet approved or cleared by the [place] FDA and  has been authorized for detection and/or diagnosis of SARS-CoV-2 by FDA under an Emergency Use Authorization (EUA). This EUA will remain  in effect (meaning this test can be used) for the duration of the COVID-19 declaration under Section 564(b)(1) of the Act, 21 U.S.C.section 360bbb-3(b)(1), unless the authorization is terminated  or revoked sooner.       Influenza A by PCR 09/30/2022 NEGATIVE  NEGATIVE Final  Influenza B by PCR 09/30/2022 NEGATIVE  NEGATIVE Final   Comment: (NOTE) The Xpert Xpress SARS-CoV-2/FLU/RSV plus assay is intended as an aid in the diagnosis of influenza from Nasopharyngeal swab specimens and should not be used as a sole basis for treatment. Nasal washings and aspirates are unacceptable for Xpert Xpress SARS-CoV-2/FLU/RSV testing.  Fact Sheet for  Patients: BloggerCourse.com  Fact Sheet for Healthcare Providers: SeriousBroker.it  This test is not yet approved or cleared by the [place] FDA and has been authorized for detection and/or diagnosis of SARS-CoV-2 by FDA under an Emergency Use Authorization (EUA). This EUA will remain in effect (meaning this test can be used) for the duration of the COVID-19 declaration under Section 564(b)(1) of the Act, 21 U.S.C. section 360bbb-3(b)(1), unless the authorization is terminated or revoked.     Resp Syncytial Virus by PCR 09/30/2022 NEGATIVE  NEGATIVE Final   Comment: (NOTE) Fact Sheet for Patients: BloggerCourse.com  Fact Sheet for Healthcare Providers: SeriousBroker.it  This test is not yet approved or cleared by the [place] FDA and has been authorized for detection and/or diagnosis of SARS-CoV-2 by FDA under an Emergency Use Authorization (EUA). This EUA will remain in effect (meaning this test can be used) for the duration of the COVID-19 declaration under Section 564(b)(1) of the Act, 21 U.S.C. section 360bbb-3(b)(1), unless the authorization is terminated or revoked.  Performed at [facility], [address] [place], [place] [address]    Sodium 10/01/2022 136  135 - 145 mmol/L Final   Potassium 10/01/2022 3.8  3.5 - 5.1 mmol/L Final   Chloride 10/01/2022 104  98 - 111 mmol/L Final   CO2 10/01/2022 27  22 - 32 mmol/L Final   Glucose, Bld 10/01/2022 98  70 - 99 mg/dL Final   Glucose reference range applies only to samples taken after fasting for at least 8 hours.   BUN 10/01/2022 12  6 - 20 mg/dL Final   Creatinine, Ser 10/01/2022 0.98  0.44 - 1.00 mg/dL Final   Calcium 40/98/1191 8.9  8.9 - 10.3 mg/dL Final   GFR, Estimated 10/01/2022 >60  >60 mL/min Final   Comment: (NOTE) Calculated using the CKD-EPI Creatinine Equation (2021)    Anion gap 10/01/2022  5  5 - 15 Final   Performed at [facility], [address], [place], [place] [address]   WBC 10/01/2022 5.1  4.0 - 10.5 K/uL Final   RBC 10/01/2022 4.31  3.87 - 5.11 MIL/uL Final   Hemoglobin 10/01/2022 12.7  12.0 - 15.0 g/dL Final   HCT 56/21/3086 38.8  36.0 - 46.0 % Final   MCV 10/01/2022 90.0  80.0 - 100.0 fL Final   MCH 10/01/2022 29.5  26.0 - 34.0 pg Final   MCHC 10/01/2022 32.7  30.0 - 36.0 g/dL Final   RDW 57/84/6962 12.4  11.5 - 15.5 % Final   Platelets 10/01/2022 300  150 - 400 K/uL Final   nRBC 10/01/2022 0.0  0.0 - 0.2 % Final   Performed at [facility], [address] [place], [place] [address]   Lipase 10/01/2022 29  11 - 51 U/L Final   Performed at [facility], [address], [place], [place] [address]   Color, Urine 10/05/2022 YELLOW  YELLOW Final   APPearance 10/05/2022 HAZY (A)  CLEAR Final   Specific Gravity, Urine 10/05/2022 1.018  1.005 - 1.030 Final   pH 10/05/2022 5.0  5.0 - 8.0 Final   Glucose,  UA 10/05/2022 NEGATIVE  NEGATIVE mg/dL Final   Hgb urine dipstick 10/05/2022 NEGATIVE  NEGATIVE Final   Bilirubin Urine 10/05/2022 NEGATIVE  NEGATIVE Final   Ketones, ur 10/05/2022 NEGATIVE  NEGATIVE mg/dL Final   Protein, ur 16/07/9603 NEGATIVE  NEGATIVE mg/dL Final   Nitrite 54/06/8118 NEGATIVE  NEGATIVE Final   Leukocytes,Ua 10/05/2022 TRACE (A)  NEGATIVE Final   RBC / HPF 10/05/2022 0-5  0 - 5 RBC/hpf Final   WBC, UA 10/05/2022 0-5  0 - 5 WBC/hpf Final   Bacteria, UA 10/05/2022 RARE (A)  NONE SEEN Final   Squamous Epithelial / HPF 10/05/2022 0-5  0 - 5 Final   Mucus 10/05/2022 PRESENT   Final   Performed at [facility], [address] [place], [place] [address]   Specimen Description 10/05/2022 URINE, CLEAN CATCH   Final   Special Requests 10/05/2022    Final                   Value:NONE Performed at [facility], [address] [place], [place] [address]    Culture 10/05/2022 10,000 COLONIES/mL MULTIPLE SPECIES PRESENT,  SUGGEST RECOLLECTION (A)   Final   Report Status 10/05/2022 10/07/2022 FINAL   Final  Admission on 07/04/2022, Discharged on 07/05/2022  Component Date Value Ref Range Status   Sodium 07/04/2022 142  135 - 145 mmol/L Final   Potassium 07/04/2022 4.4  3.5 - 5.1 mmol/L Final   Chloride 07/04/2022 109  98 - 111 mmol/L Final   CO2 07/04/2022 26  22 - 32 mmol/L Final   Glucose, Bld 07/04/2022 96  70 - 99 mg/dL Final   Glucose reference range applies only to samples taken after fasting for at least 8 hours.   BUN 07/04/2022 15  6 - 20 mg/dL Final   Creatinine, Ser 07/04/2022 0.83  0.44 - 1.00 mg/dL Final   Calcium 30/86/5784 9.3  8.9 - 10.3 mg/dL Final   Total Protein 69/62/9528 7.2  6.5 - 8.1 g/dL Final   Albumin 41/32/4401 3.9  3.5 - 5.0 g/dL Final   AST 02/72/5366 23  15 - 41 U/L Final   ALT 07/04/2022 28  0 - 44 U/L Final   Alkaline Phosphatase 07/04/2022 77  38 - 126 U/L Final   Total Bilirubin 07/04/2022 0.4  0.3 - 1.2 mg/dL Final   GFR, Estimated 07/04/2022 >60  >60 mL/min Final   Comment: (NOTE) Calculated using the CKD-EPI Creatinine Equation (2021)    Anion gap 07/04/2022 7  5 - 15 Final   Performed at [facility], [address] [place], [place] [address]   Alcohol, Ethyl (B) 07/04/2022 <10  <10 mg/dL Final   Comment: (NOTE) Lowest detectable limit for serum alcohol is 10 mg/dL.  For medical purposes only. Performed at [facility], [address] [place], [place] [address]    WBC 07/04/2022 7.0  4.0 - 10.5 K/uL Final   RBC 07/04/2022 4.18  3.87 - 5.11 MIL/uL Final   Hemoglobin 07/04/2022 12.8  12.0 - 15.0 g/dL Final   HCT 56/38/7564 39.1  36.0 - 46.0 % Final   MCV 07/04/2022 93.5  80.0 - 100.0 fL Final   MCH 07/04/2022 30.6  26.0 - 34.0 pg Final   MCHC 07/04/2022 32.7  30.0 - 36.0 g/dL Final   RDW 33/29/5188 12.9  11.5 - 15.5 % Final   Platelets 07/04/2022 243  150 - 400 K/uL Final   nRBC 07/04/2022 0.0  0.0 - 0.2 % Final    Neutrophils Relative % 07/04/2022 43  % Final   Neutro Abs 07/04/2022 3.0  1.7 - 7.7 K/uL Final   Lymphocytes Relative 07/04/2022 50  % Final   Lymphs Abs 07/04/2022 3.5  0.7 - 4.0 K/uL Final   Monocytes Relative 07/04/2022 7  % Final   Monocytes Absolute 07/04/2022 0.5  0.1 - 1.0 K/uL Final   Eosinophils Relative 07/04/2022 0  % Final   Eosinophils Absolute 07/04/2022 0.0  0.0 - 0.5 K/uL Final   Basophils Relative 07/04/2022 0  % Final   Basophils Absolute 07/04/2022 0.0  0.0 - 0.1 K/uL Final   Immature Granulocytes 07/04/2022 0  % Final   Abs Immature Granulocytes 07/04/2022 0.01  0.00 - 0.07 K/uL Final   Performed at [facility], [place] [address], [place], [place] [address]   I-stat hCG, quantitative 07/04/2022 <5.0  <5 mIU/mL Final   Comment 3 07/04/2022          Final   Comment:   GEST. AGE      CONC.  (mIU/mL)   <=1 WEEK        5 - 50     2 WEEKS       50 - 500     3 WEEKS       100 - 10,000     4 WEEKS     1,000 - 30,000        FEMALE AND NON-PREGNANT FEMALE:     LESS THAN 5 mIU/mL   Admission on 06/14/2022, Discharged on 06/14/2022  Component Date Value Ref Range Status   Color, UA 06/14/2022 yellow  yellow Final   Clarity, UA 06/14/2022 cloudy (A)  clear Final   Glucose, UA 06/14/2022 negative  negative mg/dL Final   Bilirubin, UA 06/14/2022 negative  negative Final   Ketones, POC UA 06/14/2022 negative  negative mg/dL Final   Spec Grav, UA 06/14/2022 1.025  1.010 - 1.025 Final   Blood, UA 06/14/2022 negative  negative Final   pH, UA 06/14/2022 7.5  5.0 - 8.0 Final   Protein Ur, POC 06/14/2022 =30 (A)  negative mg/dL Final   Urobilinogen, UA 06/14/2022 0.2  0.2 or 1.0 E.U./dL Final   Nitrite, UA 06/14/2022 Negative  Negative Final   Leukocytes, UA 06/14/2022 Negative  Negative Final   Preg Test, Ur 06/14/2022 Negative  Negative Final   Specimen Description 06/14/2022 URINE, CLEAN CATCH   Final   Special Requests 06/14/2022    Final                    Value:NONE Performed at [facility], [place] [address] [place], [place] [address]    Culture 06/14/2022 MULTIPLE SPECIES PRESENT, SUGGEST RECOLLECTION (A)   Final   Report Status 06/14/2022 06/16/2022 FINAL   Final  Admission on 06/07/2022, Discharged on 06/10/2022  Component Date Value Ref Range Status   SARS Coronavirus 2 by RT PCR 06/07/2022 NEGATIVE  NEGATIVE Final   Comment: (NOTE) SARS-CoV-2 target nucleic acids are NOT DETECTED.  The SARS-CoV-2 RNA is generally detectable in upper respiratory specimens during the acute phase of infection. The lowest concentration of SARS-CoV-2 viral copies this assay can detect is 138 copies/mL. A negative result does not preclude SARS-Cov-2 infection and should not be used as the sole basis for treatment or other patient management decisions. A negative result may occur with  improper specimen collection/handling, submission of specimen other than nasopharyngeal  swab, presence of viral mutation(s) within the areas targeted by this assay, and inadequate number of viral copies(<138 copies/mL). A negative result must be combined with clinical observations, patient history, and epidemiological information. The expected result is Negative.  Fact Sheet for Patients:  BloggerCourse.com  Fact Sheet for Healthcare Providers:  SeriousBroker.it  This test is no                          t yet approved or cleared by the [place] FDA and  has been authorized for detection and/or diagnosis of SARS-CoV-2 by FDA under an Emergency Use Authorization (EUA). This EUA will remain  in effect (meaning this test can be used) for the duration of the COVID-19 declaration under Section 564(b)(1) of the Act, 21 U.S.C.section 360bbb-3(b)(1), unless the authorization is terminated  or revoked sooner.       Influenza A by PCR 06/07/2022 NEGATIVE  NEGATIVE Final   Influenza B by PCR 06/07/2022 NEGATIVE   NEGATIVE Final   Comment: (NOTE) The Xpert Xpress SARS-CoV-2/FLU/RSV plus assay is intended as an aid in the diagnosis of influenza from Nasopharyngeal swab specimens and should not be used as a sole basis for treatment. Nasal washings and aspirates are unacceptable for Xpert Xpress SARS-CoV-2/FLU/RSV testing.  Fact Sheet for Patients: BloggerCourse.com  Fact Sheet for Healthcare Providers: SeriousBroker.it  This test is not yet approved or cleared by the [place] FDA and has been authorized for detection and/or diagnosis of SARS-CoV-2 by FDA under an Emergency Use Authorization (EUA). This EUA will remain in effect (meaning this test can be used) for the duration of the COVID-19 declaration under Section 564(b)(1) of the Act, 21 U.S.C. section 360bbb-3(b)(1), unless the authorization is terminated or revoked.  Performed at [facility], [address], [place], [place] [address]    WBC 06/07/2022 5.7  4.0 - 10.5 K/uL Final   RBC 06/07/2022 4.39  3.87 - 5.11 MIL/uL Final   Hemoglobin 06/07/2022 13.2  12.0 - 15.0 g/dL Final   HCT 47/82/9562 40.4  36.0 - 46.0 % Final   MCV 06/07/2022 92.0  80.0 - 100.0 fL Final   MCH 06/07/2022 30.1  26.0 - 34.0 pg Final   MCHC 06/07/2022 32.7  30.0 - 36.0 g/dL Final   RDW 13/05/6577 12.4  11.5 - 15.5 % Final   Platelets 06/07/2022 308  150 - 400 K/uL Final   nRBC 06/07/2022 0.0  0.0 - 0.2 % Final   Neutrophils Relative % 06/07/2022 42  % Final   Neutro Abs 06/07/2022 2.4  1.7 - 7.7 K/uL Final   Lymphocytes Relative 06/07/2022 54  % Final   Lymphs Abs 06/07/2022 3.1  0.7 - 4.0 K/uL Final   Monocytes Relative 06/07/2022 4  % Final   Monocytes Absolute 06/07/2022 0.2  0.1 - 1.0 K/uL Final   Eosinophils Relative 06/07/2022 0  % Final   Eosinophils Absolute 06/07/2022 0.0  0.0 - 0.5 K/uL Final   Basophils Relative 06/07/2022 0  % Final   Basophils Absolute 06/07/2022 0.0  0.0 - 0.1 K/uL  Final   Immature Granulocytes 06/07/2022 0  % Final   Abs Immature Granulocytes 06/07/2022 0.01  0.00 - 0.07 K/uL Final   Performed at [facility], [address] [place], [place] [address]   Sodium 06/07/2022 139  135 - 145 mmol/L Final   Potassium 06/07/2022 4.0  3.5 - 5.1 mmol/L Final   Chloride 06/07/2022 104  98 - 111 mmol/L Final   CO2 06/07/2022 28  22 - 32 mmol/L Final   Glucose, Bld 06/07/2022 104 (H)  70 - 99 mg/dL Final   Glucose reference range applies only to samples taken after fasting for at least 8 hours.   BUN 06/07/2022 8  6 - 20 mg/dL Final   Creatinine, Ser 06/07/2022 0.84  0.44 - 1.00 mg/dL Final   Calcium 14/78/2956 9.1  8.9 - 10.3 mg/dL Final   Total Protein 21/30/8657 6.9  6.5 - 8.1 g/dL Final   Albumin 84/69/6295 3.7  3.5 - 5.0 g/dL Final   AST 28/41/3244 19  15 - 41 U/L Final   ALT 06/07/2022 22  0 - 44 U/L Final   Alkaline Phosphatase 06/07/2022 54  38 - 126 U/L Final   Total Bilirubin 06/07/2022 0.4  0.3 - 1.2 mg/dL Final   GFR, Estimated 06/07/2022 >60  >60 mL/min Final   Comment: (NOTE) Calculated using the CKD-EPI Creatinine Equation (2021)    Anion gap 06/07/2022 7  5 - 15 Final   Performed at [facility], [address], [place], [place] [address]   Hgb A1c MFr Bld 06/07/2022 5.0  4.8 - 5.6 % Final   Comment: (NOTE) Pre diabetes:          5.7%-6.4%  Diabetes:              >6.4%  Glycemic control for   <7.0% adults with diabetes    Mean Plasma Glucose 06/07/2022 96.8  mg/dL Final   Performed at [facility], [address], [place], [place] [address]   TSH 06/07/2022 1.620  0.350 - 4.500 uIU/mL Final   Comment: Performed by a 3rd Generation assay with a functional sensitivity of <=0.01 uIU/mL. Performed at [facility], [address], [place], [place] [address]    RPR Ser Ql 06/07/2022 NON REACTIVE  NON REACTIVE Final   Performed at [facility], [address], [place], [place] [address]   Color, Urine  06/07/2022 YELLOW  YELLOW Final   APPearance 06/07/2022 HAZY (A)  CLEAR Final   Specific Gravity, Urine 06/07/2022 1.018  1.005 - 1.030 Final   pH 06/07/2022 7.0  5.0 - 8.0 Final   Glucose, UA 06/07/2022 NEGATIVE  NEGATIVE mg/dL Final   Hgb urine dipstick 06/07/2022 NEGATIVE  NEGATIVE Final   Bilirubin Urine 06/07/2022 NEGATIVE  NEGATIVE Final   Ketones, ur 06/07/2022 NEGATIVE  NEGATIVE mg/dL Final   Protein, ur 56/38/7564 NEGATIVE  NEGATIVE mg/dL Final   Nitrite 33/29/5188 NEGATIVE  NEGATIVE Final   Leukocytes,Ua 06/07/2022 NEGATIVE  NEGATIVE Final   Performed at [facility], [address], [place], [place] [address]   Cholesterol 06/07/2022 164  0 - 200 mg/dL Final   Triglycerides 63/10/6008 158 (H)  <150 mg/dL Final   HDL 93/23/5573 44  >40 mg/dL Final   Total CHOL/HDL Ratio 06/07/2022 3.7  RATIO Final   VLDL 06/07/2022 32  0 - 40 mg/dL Final   LDL Cholesterol 06/07/2022 88  0 - 99 mg/dL Final   Comment:        Total Cholesterol/HDL:CHD Risk Coronary Heart Disease Risk Table                     Men   Women  1/2 Average Risk   3.4   3.3  Average Risk       5.0   4.4  2 X Average  Risk   9.6   7.1  3 X Average Risk  23.4   11.0        Use the calculated Patient Ratio above and the CHD Risk Table to determine the patient's CHD Risk.        ATP III CLASSIFICATION (LDL):  <100     mg/dL   Optimal  [phone]  mg/dL   Near or Above                    Optimal  130-159  mg/dL   Borderline  [phone]  mg/dL   High  >956>190     mg/dL   Very High Performed at [facility], [address] [place], [place] [address]    HIV Screen 4th Generation wRfx 06/07/2022 Non Reactive  Non Reactive Final   Performed at [facility], [address] [place], [place] [address]   SARSCOV2ONAVIRUS 2 AG 06/07/2022 NEGATIVE  NEGATIVE Final   Comment: (NOTE) SARS-CoV-2 antigen NOT DETECTED.   Negative results are presumptive.  Negative results do not preclude SARS-CoV-2 infection and  should not be used as the sole basis for treatment or other patient management decisions, including infection  control decisions, particularly in the presence of clinical signs and  symptoms consistent with COVID-19, or in those who have been in contact with the virus.  Negative results must be combined with clinical observations, patient history, and epidemiological information. The expected result is Negative.  Fact Sheet for Patients: https://www.jennings-kim.com/https://www.fda.gov/media/141569/download  Fact Sheet for Healthcare Providers: https://alexander-rogers.biz/https://www.fda.gov/media/141568/download  This test is not yet approved or cleared by the [place] FDA and  has been authorized for detection and/or diagnosis of SARS-CoV-2 by FDA under an Emergency Use Authorization (EUA).  This EUA will remain in effect (meaning this test can be used) for the duration of  the COV                          ID-19 declaration under Section 564(b)(1) of the Act, 21 U.S.C. section 360bbb-3(b)(1), unless the authorization is terminated or revoked sooner.     POC Amphetamine UR 06/07/2022 None Detected  NONE DETECTED (Cut Off Level 1000 ng/mL) Final   POC Secobarbital (BAR) 06/07/2022 None Detected  NONE DETECTED (Cut Off Level 300 ng/mL) Final   POC Buprenorphine (BUP) 06/07/2022 None Detected  NONE DETECTED (Cut Off Level 10 ng/mL) Final   POC Oxazepam (BZO) 06/07/2022 None Detected  NONE DETECTED (Cut Off Level 300 ng/mL) Final   POC Cocaine UR 06/07/2022 None Detected  NONE DETECTED (Cut Off Level 300 ng/mL) Final   POC Methamphetamine UR 06/07/2022 None Detected  NONE DETECTED (Cut Off Level 1000 ng/mL) Final   POC Morphine 06/07/2022 None Detected  NONE DETECTED (Cut Off Level 300 ng/mL) Final   POC Methadone UR 06/07/2022 None Detected  NONE DETECTED (Cut Off Level 300 ng/mL) Final   POC Oxycodone UR 06/07/2022 None Detected  NONE DETECTED (Cut Off Level 100 ng/mL) Final   POC Marijuana UR 06/07/2022 None Detected  NONE DETECTED  (Cut Off Level 50 ng/mL) Final   Preg Test, Ur 06/08/2022 NEGATIVE  NEGATIVE Final   Comment:        THE SENSITIVITY OF THIS METHODOLOGY IS >20 mIU/mL. Performed at [facility], [address] [place], [place] [address]    Preg Test, Ur 06/08/2022 NEGATIVE  NEGATIVE Final   Comment:  THE SENSITIVITY OF THIS METHODOLOGY IS >24 mIU/mL     Blood Alcohol level:  Lab Results  Component Value Date   ETH <10 07/04/2022   ETH <10 02/07/2022    Metabolic Disorder Labs: Lab Results  Component Value Date   HGBA1C 5.7 (H) 10/15/2022   MPG 117 10/15/2022   MPG 96.8 06/07/2022   No results found for: "PROLACTIN" Lab Results  Component Value Date   CHOL 181 07/25/2022   TRIG 118 07/25/2022   HDL 45 07/25/2022   CHOLHDL 4.0 07/25/2022   VLDL 24 07/25/2022   LDLCALC 112 (H) 07/25/2022   LDLCALC 88 06/07/2022    Therapeutic Lab Levels: No results found for: "LITHIUM" No results found for: "VALPROATE" No results found for: "CBMZ"  Physical Findings   GAD-7    Flowsheet Row [facility]  Total GAD-7 Score 17      PHQ2-9    Flowsheet Row ED [facility]20[facility] ED from 06/07/2022 in [facility]  PHQ-2 Total Score 0 1 2  PHQ-9 Total Score -- 2 8      Flowsheet Row [facility] Most recent reading at 11/07/2022  9:32 AM [facility] Most recent reading at 10/07/2022 12:18 PM [facility] Most recent reading at 10/06/2022  9:38 AM  C-SSRS RISK CATEGORY No Risk No Risk No Risk        Musculoskeletal  Strength & Muscle Tone: within normal limits Gait & Station: normal Patient  leans: N/A  Psychiatric Specialty Exam  Presentation  General Appearance:  Appropriate for Environment; Casual; Fairly Groomed  Eye Contact: Good  Speech: Clear and Coherent; Normal Rate  Speech Volume: Normal  Handedness: Right   Mood and Affect  Mood: Euthymic  Affect: Full Range   Thought Process  Thought Processes: Coherent; Goal Directed  Descriptions of Associations:Intact  Orientation:Full (Time, Place and Person)  Thought Content:Logical  Diagnosis of Schizophrenia or Schizoaffective disorder in past: Yes  Duration of Psychotic Symptoms: Greater than six months   Hallucinations:Hallucinations: None  Ideas of Reference:None  Suicidal Thoughts:Suicidal Thoughts: No  Homicidal Thoughts:Homicidal Thoughts: No   Sensorium  Memory: Immediate Good  Judgment: Good  Insight: Good   Executive Functions  Concentration: Good  Attention Span: Good  Recall: Good  Fund of Knowledge: Good  Language: Good   Psychomotor Activity  Psychomotor Activity: Psychomotor Activity: Normal   Assets  Assets: Communication Skills; Desire for Improvement; Financial Resources/Insurance; Leisure Time; Physical Health; Resilience; Social Support   Sleep  Sleep: Sleep: Good   No data recorded  Physical Exam  Physical Exam Constitutional:      General: She is not in acute distress.    Appearance: She is not ill-appearing, toxic-appearing or diaphoretic.  Eyes:     General: No scleral icterus. Cardiovascular:     Rate and Rhythm: Tachycardia present.  Pulmonary:     Effort: Pulmonary effort is normal. No respiratory distress.  Neurological:     Mental Status: She is alert and oriented to person, place, and time.  Psychiatric:        Attention and Perception: Attention and perception normal.        Mood and Affect: Mood  and affect normal.        Speech: Speech normal.        Behavior: Behavior normal. Behavior is cooperative.         Thought Content: Thought content normal.    Review of Systems  Constitutional:  Negative for chills and fever.  Respiratory:  Negative for shortness of breath.   Cardiovascular:  Negative for chest pain and palpitations.  Gastrointestinal:  Negative for abdominal pain.  Genitourinary:  Negative for dysuria, flank pain, frequency, hematuria and urgency.   Blood pressure 102/68, pulse (!) 110, temperature 98.2 F (36.8 C), temperature source Oral, resp. rate 17, height 5\' 1"  (1.549 m), weight 198 lb (89.8 kg), SpO2 99 %. Body mass index is 37.41 kg/m.  Treatment Plan Summary: Plan Pt remains psychiatrically cleared  , NP 11/14/2022 10:58 AM

## 2022-11-14 NOTE — ED Notes (Signed)
Pt sleeping at present, no distress noted.  Monitoring for safety. 

## 2022-11-14 NOTE — Progress Notes (Signed)
Pt is awake, alert and oriented X4. Pt complained of back pain and is on scheduled Lidocaine patch. No signs of acute distress noted. Administered scheduled meds with no issue. Pt denies SI/HI/AVH, plan or intent. Staff will monitor for pt's safety.

## 2022-11-14 NOTE — ED Notes (Signed)
Patient resting quietly in bed with eyes closed, Respirations equal and unlabored, skin warm and dry. Patient denies SI, HI and AVH. Routine safety checks conducted according to facility protocol. Will continue to monitor for safety and update as needed.

## 2022-11-14 NOTE — ED Provider Notes (Addendum)
Received report from nursing staff the patient was complaining of ongoing vaginal itching, burning with white cheesy discharge.   Patient stated she is unable to remember the day these symptoms started, but was given a dose of diflucan 150 mg 11/12/22 without relief.  Urinalysis collected 11/13/22 was cloudy but mostly unremarkable.  This provider consulted with Dr Elisabeth Pigeon at [facility] for next steps as  test supplies needed to detect/determine a diagnosis is unavailable at [facility].   Dr Elisabeth Pigeon suggests patient can be given hydrocortisone cream overnight to manage her symptoms until she can be properly seen in the am as her condition is not an emergency.  Hydrocortisone 1% cream ordered.

## 2022-11-15 DIAGNOSIS — E039 Hypothyroidism, unspecified: Secondary | ICD-10-CM | POA: Diagnosis not present

## 2022-11-15 DIAGNOSIS — F209 Schizophrenia, unspecified: Secondary | ICD-10-CM | POA: Diagnosis not present

## 2022-11-15 DIAGNOSIS — F17200 Nicotine dependence, unspecified, uncomplicated: Secondary | ICD-10-CM | POA: Diagnosis not present

## 2022-11-15 DIAGNOSIS — F419 Anxiety disorder, unspecified: Secondary | ICD-10-CM | POA: Diagnosis not present

## 2022-11-15 DIAGNOSIS — Z1152 Encounter for screening for COVID-19: Secondary | ICD-10-CM | POA: Diagnosis not present

## 2022-11-15 DIAGNOSIS — F333 Major depressive disorder, recurrent, severe with psychotic symptoms: Secondary | ICD-10-CM | POA: Diagnosis not present

## 2022-11-15 DIAGNOSIS — E118 Type 2 diabetes mellitus with unspecified complications: Secondary | ICD-10-CM | POA: Diagnosis not present

## 2022-11-15 DIAGNOSIS — N390 Urinary tract infection, site not specified: Secondary | ICD-10-CM | POA: Diagnosis not present

## 2022-11-15 DIAGNOSIS — N9489 Other specified conditions associated with female genital organs and menstrual cycle: Secondary | ICD-10-CM | POA: Diagnosis not present

## 2022-11-15 NOTE — ED Provider Notes (Signed)
Behavioral Health Progress Note  Date and Time: 11/15/2022 10:04 AM Name: Nicole [name] MRN:  [id number]  Nicole MassonLatasha [name] is a 32 y/o female with PMH of SCZ unspecified, MDD with psychosis, who [facility] via [facility] after a verbal altercation with Jeannett SeniorJosephine Okeke (not patient's legal guardian) for transient SI. [facility] is [facility]. Patient has been dismissed from previous group home due to threatening SI and HI, then leaving the group home. 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  Subjective:    On reassessment, pt is asleep, although wakens to name being called. She endorses euthymic mood. She denies suicidal, homicidal or violent ideations. She denies auditory visual hallucinations or paranoia. Objectively, there is no evidence of agitation, aggression, distractibility or internal preoccupation. No delusions or paranoia elicited. Pt remains psychiatrically cleared. Disposition is pending placement.  Diagnosis:  Final diagnoses:  Tobacco use disorder  Schizophrenia, unspecified type (HCC)  MDD (major depressive disorder), recurrent episode, moderate (HCC)  Hypothyroidism, unspecified type    Total Time spent with patient: 15 minutes  Past Psychiatric History: SCZ unspecified, MDD with psychosis, anxiety, depression, tobacco use Past Medical History: Hypothyroidism Family History: Hypertension - father diabetes Family Psychiatric  History: None reported Social History: Currently seeking placement  Additional Social History:                         Sleep: Good  Appetite:  Good  Current Medications:  Current Facility-Administered Medications  Medication Dose Route Frequency Provider Last Rate Last Admin   acetaminophen (TYLENOL) tablet 650 mg  650 mg Oral Q6H PRN Marlou SaByungura, Veronique M, NP   650 mg at 11/04/22 2154   alum & mag hydroxide-simeth (MAALOX/MYLANTA) 200-200-20  MG/5ML suspension 30 mL  30 mL Oral Q4H PRN Marlou SaByungura, Veronique M, NP   30 mL at 11/11/22 0052   ARIPiprazole ER (ABILIFY MAINTENA) 400 MG prefilled syringe 400 mg  400 mg Intramuscular Q28 days Princess BruinsNguyen, Julie, DO   400 mg at 10/25/22 0920   cetaphil lotion   Topical PRN Princess BruinsNguyen, Julie, DO       fluticasone (FLONASE) 50 MCG/ACT nasal spray 1 spray  1 spray Each Nare QHS Lauree ChandlerLee, Richell Corker Eun, NP   1 spray at 11/14/22 2201   hydrocortisone cream 1 %   Topical QID PRN Onuoha, Chinwendu V, NP   Given at 11/14/22 [id number]   hydrOXYzine (ATARAX) tablet 25 mg  25 mg Oral TID PRN Lorri Frederickarrion-Carrero, Margely, MD   25 mg at 11/14/22 2201   ketoconazole (NIZORAL) 2 % cream   Topical BID Princess BruinsNguyen, Julie, DO   Given at 11/14/22 2201   levothyroxine (SYNTHROID) tablet 100 mcg  100 mcg Oral Once Byungura, Veronique M, NP       levothyroxine (SYNTHROID) tablet 100 mcg  100 mcg Oral Q0600 Marlou SaByungura, Veronique M, NP   100 mcg at 11/15/22 0655   lidocaine (LIDODERM) 5 % 1 patch  1 patch Transdermal Q24H Princess BruinsNguyen, Julie, DO   1 patch at 11/14/22 [id number]   loratadine (CLARITIN) tablet 10 mg  10 mg Oral Daily Carrion-Carrero, Karle StarchMargely, MD   10 mg at 11/14/22 0906   magnesium hydroxide (MILK OF MAGNESIA) suspension 30 mL  30 mL Oral Daily PRN Marlou SaByungura, Veronique M, NP   30 mL at 10/12/22 2145   metFORMIN (GLUCOPHAGE) tablet 500 mg  500 mg Oral Q breakfast Carrion-Carrero, Karle StarchMargely, MD   500 mg  at 11/15/22 0819   nicotine (NICODERM CQ - dosed in mg/24 hours) patch 14 mg  14 mg Transdermal Daily PRN Carrion-Carrero, Karle Starch, MD       ondansetron (ZOFRAN-ODT) disintegrating tablet 4 mg  4 mg Oral Q8H PRN Carrion-Carrero, Margely, MD   4 mg at 11/01/22 2246   Oxcarbazepine (TRILEPTAL) tablet 300 mg  300 mg Oral BID Byungura, Veronique M, NP   300 mg at 11/14/22 2200   pantoprazole (PROTONIX) EC tablet 40 mg  40 mg Oral Daily Carrion-Carrero, Margely, MD   40 mg at 11/14/22 0906   polyethylene glycol (MIRALAX / GLYCOLAX) packet 17 g  17 g Oral  Daily Princess Bruins, DO   17 g at 11/14/22 0981   QUEtiapine (SEROQUEL) tablet 400 mg  400 mg Oral BID Rayburn Go, Veronique M, NP   400 mg at 11/14/22 2155   sertraline (ZOLOFT) tablet 150 mg  150 mg Oral Daily Carrion-Carrero, Margely, MD   150 mg at 11/14/22 0906   sodium chloride (OCEAN) 0.65 % nasal spray 1 spray  1 spray Each Nare Daily Princess Bruins, DO   1 spray at 11/14/22 0906   traZODone (DESYREL) tablet 100 mg  100 mg Oral QHS Olin Pia M, NP   100 mg at 11/14/22 2201   traZODone (DESYREL) tablet 50 mg  50 mg Oral QHS PRN Marlou Sa, NP   50 mg at 11/14/22 2200   valACYclovir (VALTREX) tablet 500 mg  500 mg Oral Daily Carrion-Carrero, Karle Starch, MD   500 mg at 11/14/22 0906   zinc oxide (BALMEX) 11.3 % cream   Topical PRN Princess Bruins, DO       Current Outpatient Medications  Medication Sig Dispense Refill   ABILIFY MAINTENA 400 MG PRSY prefilled syringe 400 mg every 28 (twenty-eight) days.     cetirizine (ZYRTEC) 10 MG tablet Take 10 mg by mouth daily.     cyclobenzaprine (FLEXERIL) 10 MG tablet Take 1 tablet (10 mg total) by mouth 2 (two) times daily as needed for muscle spasms. 20 tablet 0   fluticasone (FLONASE) 50 MCG/ACT nasal spray Place 1 spray into both nostrils daily.     meloxicam (MOBIC) 15 MG tablet Take 15 mg by mouth daily.     nitrofurantoin, macrocrystal-monohydrate, (MACROBID) 100 MG capsule Take 1 capsule (100 mg total) by mouth 2 (two) times daily. 10 capsule 0   ondansetron (ZOFRAN-ODT) 4 MG disintegrating tablet Take 1 tablet (4 mg total) by mouth every 8 (eight) hours as needed for nausea or vomiting. 20 tablet 0   Oxcarbazepine (TRILEPTAL) 300 MG tablet Take 1 tablet (300 mg total) by mouth 2 (two) times daily. 60 tablet 0   QUEtiapine (SEROQUEL) 400 MG tablet Take 1 tablet (400 mg total) by mouth 2 (two) times daily. 60 tablet 0   sertraline (ZOLOFT) 50 MG tablet Take 3 tablets (150 mg total) by mouth in the morning. 90 tablet 0   traZODone  (DESYREL) 100 MG tablet Take 1 tablet (100 mg total) by mouth at bedtime. 30 tablet 0   valACYclovir (VALTREX) 500 MG tablet Take 500 mg by mouth daily.      Labs  Lab Results:  Admission on 10/07/2022  Component Date Value Ref Range Status   Hgb A1c MFr Bld 10/15/2022 5.7 (H)  4.8 - 5.6 % Final   Comment: (NOTE)         Prediabetes: 5.7 - 6.4         Diabetes: >6.4  Glycemic control for adults with diabetes: <7.0    Mean Plasma Glucose 10/15/2022 117  mg/dL Final   Comment: (NOTE) Performed At: [facility] [address] [place], [place] [id number] Jolene Schimke MD WU:[id number]    Color, Urine 11/13/2022 YELLOW  YELLOW Final   APPearance 11/13/2022 CLOUDY (A)  CLEAR Final   Specific Gravity, Urine 11/13/2022 1.023  1.005 - 1.030 Final   pH 11/13/2022 6.0  5.0 - 8.0 Final   Glucose, UA 11/13/2022 NEGATIVE  NEGATIVE mg/dL Final   Hgb urine dipstick 11/13/2022 NEGATIVE  NEGATIVE Final   Bilirubin Urine 11/13/2022 NEGATIVE  NEGATIVE Final   Ketones, ur 11/13/2022 NEGATIVE  NEGATIVE mg/dL Final   Protein, ur 95/62/1308 NEGATIVE  NEGATIVE mg/dL Final   Nitrite 65/78/4696 NEGATIVE  NEGATIVE Final   Leukocytes,Ua 11/13/2022 NEGATIVE  NEGATIVE Final   Performed at [facility] Lab, [address], [place], [place] [address]  Office Visit on 10/21/2022  Component Date Value Ref Range Status   SARSCOV2ONAVIRUS 2 AG 11/01/2022 NEGATIVE  NEGATIVE Final   Comment: (NOTE) SARS-CoV-2 antigen NOT DETECTED.   Negative results are presumptive.  Negative results do not preclude SARS-CoV-2 infection and should not be used as the sole basis for treatment or other patient management decisions, including infection  control decisions, particularly in the presence of clinical signs and  symptoms consistent with COVID-19, or in those who have been in contact with the virus.  Negative results must be combined with clinical observations, patient history, and  epidemiological information. The expected result is Negative.  Fact Sheet for Patients: https://www.jennings-kim.com/  Fact Sheet for Healthcare Providers: https://alexander-rogers.biz/  This test is not yet approved or cleared by the [place] FDA and  has been authorized for detection and/or diagnosis of SARS-CoV-2 by FDA under an Emergency Use Authorization (EUA).  This EUA will remain in effect (meaning this test can be used) for the duration of  the COV                          ID-19 declaration under Section 564(b)(1) of the Act, 21 U.S.C. section 360bbb-3(b)(1), unless the authorization is terminated or revoked sooner.    Admission on 10/07/2022, Discharged on 10/07/2022  Component Date Value Ref Range Status   Color, Urine 10/07/2022 YELLOW  YELLOW Final   APPearance 10/07/2022 HAZY (A)  CLEAR Final   Specific Gravity, Urine 10/07/2022 1.011  1.005 - 1.030 Final   pH 10/07/2022 7.0  5.0 - 8.0 Final   Glucose, UA 10/07/2022 NEGATIVE  NEGATIVE mg/dL Final   Hgb urine dipstick 10/07/2022 SMALL (A)  NEGATIVE Final   Bilirubin Urine 10/07/2022 NEGATIVE  NEGATIVE Final   Ketones, ur 10/07/2022 NEGATIVE  NEGATIVE mg/dL Final   Protein, ur 41/32/4401 NEGATIVE  NEGATIVE mg/dL Final   Nitrite 02/72/5366 NEGATIVE  NEGATIVE Final   Leukocytes,Ua 10/07/2022 LARGE (A)  NEGATIVE Final   RBC / HPF 10/07/2022 0-5  0 - 5 RBC/hpf Final   WBC, UA 10/07/2022 0-5  0 - 5 WBC/hpf Final   Bacteria, UA 10/07/2022 FEW (A)  NONE SEEN Final   Squamous Epithelial / HPF 10/07/2022 11-20  0 - 5 Final   Performed at [facility], [address], [place], [place] [address]   I-stat hCG, quantitative 10/07/2022 <5.0  <5 mIU/mL Final   Comment 3 10/07/2022          Final   Comment:   GEST. AGE  CONC.  (mIU/mL)   <=1 WEEK        5 - 50     2 WEEKS       50 - 500     3 WEEKS       100 - 10,000     4 WEEKS     1,000 - 30,000        FEMALE AND NON-PREGNANT FEMALE:      LESS THAN 5 mIU/mL    Sodium 10/07/2022 138  135 - 145 mmol/L Final   Potassium 10/07/2022 4.3  3.5 - 5.1 mmol/L Final   Chloride 10/07/2022 104  98 - 111 mmol/L Final   CO2 10/07/2022 28  22 - 32 mmol/L Final   Glucose, Bld 10/07/2022 90  70 - 99 mg/dL Final   Glucose reference range applies only to samples taken after fasting for at least 8 hours.   BUN 10/07/2022 8  6 - 20 mg/dL Final   Creatinine, Ser 10/07/2022 0.96  0.44 - 1.00 mg/dL Final   Calcium 16/07/9603 9.1  8.9 - 10.3 mg/dL Final   GFR, Estimated 10/07/2022 >60  >60 mL/min Final   Comment: (NOTE) Calculated using the CKD-EPI Creatinine Equation (2021)    Anion gap 10/07/2022 6  5 - 15 Final   Performed at [facility], [address] [place], [place] [address]   WBC 10/07/2022 5.8  4.0 - 10.5 K/uL Final   RBC 10/07/2022 4.36  3.87 - 5.11 MIL/uL Final   Hemoglobin 10/07/2022 12.9  12.0 - 15.0 g/dL Final   HCT 11/91/4782 40.0  36.0 - 46.0 % Final   MCV 10/07/2022 91.7  80.0 - 100.0 fL Final   MCH 10/07/2022 29.6  26.0 - 34.0 pg Final   MCHC 10/07/2022 32.3  30.0 - 36.0 g/dL Final   RDW 95/62/1308 12.4  11.5 - 15.5 % Final   Platelets 10/07/2022 295  150 - 400 K/uL Final   nRBC 10/07/2022 0.0  0.0 - 0.2 % Final   Performed at [facility], [address], [place], [place] [address]   SARS Coronavirus 2 by RT PCR 10/07/2022 NEGATIVE  NEGATIVE Final   Comment: (NOTE) SARS-CoV-2 target nucleic acids are NOT DETECTED.  The SARS-CoV-2 RNA is generally detectable in upper respiratory specimens during the acute phase of infection. The lowest concentration of SARS-CoV-2 viral copies this assay can detect is 138 copies/mL. A negative result does not preclude SARS-Cov-2 infection and should not be used as the sole basis for treatment or other patient management decisions. A negative result may occur with  improper specimen collection/handling, submission of specimen other than nasopharyngeal swab, presence of  viral mutation(s) within the areas targeted by this assay, and inadequate number of viral copies(<138 copies/mL). A negative result must be combined with clinical observations, patient history, and epidemiological information. The expected result is Negative.  Fact Sheet for Patients:  BloggerCourse.com  Fact Sheet for Healthcare Providers:  SeriousBroker.it  This test is no                          t yet approved or cleared by the [place] FDA and  has been authorized for detection and/or diagnosis of SARS-CoV-2 by FDA under an Emergency Use Authorization (EUA). This EUA will remain  in effect (meaning this test can be used) for the duration of the COVID-19 declaration under Section 564(b)(1) of the Act, 21 U.S.C.section 360bbb-3(b)(1), unless the  authorization is terminated  or revoked sooner.       Influenza A by PCR 10/07/2022 NEGATIVE  NEGATIVE Final   Influenza B by PCR 10/07/2022 NEGATIVE  NEGATIVE Final   Comment: (NOTE) The Xpert Xpress SARS-CoV-2/FLU/RSV plus assay is intended as an aid in the diagnosis of influenza from Nasopharyngeal swab specimens and should not be used as a sole basis for treatment. Nasal washings and aspirates are unacceptable for Xpert Xpress SARS-CoV-2/FLU/RSV testing.  Fact Sheet for Patients: BloggerCourse.com  Fact Sheet for Healthcare Providers: SeriousBroker.it  This test is not yet approved or cleared by the [place] FDA and has been authorized for detection and/or diagnosis of SARS-CoV-2 by FDA under an Emergency Use Authorization (EUA). This EUA will remain in effect (meaning this test can be used) for the duration of the COVID-19 declaration under Section 564(b)(1) of the Act, 21 U.S.C. section 360bbb-3(b)(1), unless the authorization is terminated or revoked.     Resp Syncytial Virus by PCR 10/07/2022 NEGATIVE  NEGATIVE  Final   Comment: (NOTE) Fact Sheet for Patients: BloggerCourse.com  Fact Sheet for Healthcare Providers: SeriousBroker.it  This test is not yet approved or cleared by the [place] FDA and has been authorized for detection and/or diagnosis of SARS-CoV-2 by FDA under an Emergency Use Authorization (EUA). This EUA will remain in effect (meaning this test can be used) for the duration of the COVID-19 declaration under Section 564(b)(1) of the Act, 21 U.S.C. section 360bbb-3(b)(1), unless the authorization is terminated or revoked.  Performed at [facility], [address] [place], [place] [address]    Specimen Description 10/07/2022 URINE, CLEAN CATCH   Final   Special Requests 10/07/2022    Final                   Value:NONE Performed at [facility], [address] [place], [place] [address]    Culture 10/07/2022 MULTIPLE SPECIES PRESENT, SUGGEST RECOLLECTION (A)   Final   Report Status 10/07/2022 10/08/2022 FINAL   Final  Admission on 07/25/2022, Discharged on 10/07/2022  Component Date Value Ref Range Status   SARS Coronavirus 2 by RT PCR 07/25/2022 NEGATIVE  NEGATIVE Final   Comment: (NOTE) SARS-CoV-2 target nucleic acids are NOT DETECTED.  The SARS-CoV-2 RNA is generally detectable in upper respiratory specimens during the acute phase of infection. The lowest concentration of SARS-CoV-2 viral copies this assay can detect is 138 copies/mL. A negative result does not preclude SARS-Cov-2 infection and should not be used as the sole basis for treatment or other patient management decisions. A negative result may occur with  improper specimen collection/handling, submission of specimen other than nasopharyngeal swab, presence of viral mutation(s) within the areas targeted by this assay, and inadequate number of viral copies(<138 copies/mL). A negative result must be combined with clinical observations, patient  history, and epidemiological information. The expected result is Negative.  Fact Sheet for Patients:  BloggerCourse.com  Fact Sheet for Healthcare Providers:  SeriousBroker.it  This test is no                          t yet approved or cleared by the [place] FDA and  has been authorized for detection and/or diagnosis of SARS-CoV-2 by FDA under an Emergency Use Authorization (EUA). This EUA will remain  in effect (meaning this test can be used) for the duration of the COVID-19 declaration under Section 564(b)(1) of the Act, 21 U.S.C.section  360bbb-3(b)(1), unless the authorization is terminated  or revoked sooner.       Influenza A by PCR 07/25/2022 NEGATIVE  NEGATIVE Final   Influenza B by PCR 07/25/2022 NEGATIVE  NEGATIVE Final   Comment: (NOTE) The Xpert Xpress SARS-CoV-2/FLU/RSV plus assay is intended as an aid in the diagnosis of influenza from Nasopharyngeal swab specimens and should not be used as a sole basis for treatment. Nasal washings and aspirates are unacceptable for Xpert Xpress SARS-CoV-2/FLU/RSV testing.  Fact Sheet for Patients: BloggerCourse.com  Fact Sheet for Healthcare Providers: SeriousBroker.it  This test is not yet approved or cleared by the [place] FDA and has been authorized for detection and/or diagnosis of SARS-CoV-2 by FDA under an Emergency Use Authorization (EUA). This EUA will remain in effect (meaning this test can be used) for the duration of the COVID-19 declaration under Section 564(b)(1) of the Act, 21 U.S.C. section 360bbb-3(b)(1), unless the authorization is terminated or revoked.  Performed at [facility], [address] [place], [place] [address]    WBC 07/25/2022 8.3  4.0 - 10.5 K/uL Final   RBC 07/25/2022 4.44  3.87 - 5.11 MIL/uL Final   Hemoglobin 07/25/2022 13.7  12.0 - 15.0 g/dL Final   HCT 60/45/4098 40.2   36.0 - 46.0 % Final   MCV 07/25/2022 90.5  80.0 - 100.0 fL Final   MCH 07/25/2022 30.9  26.0 - 34.0 pg Final   MCHC 07/25/2022 34.1  30.0 - 36.0 g/dL Final   RDW 11/91/4782 12.2  11.5 - 15.5 % Final   Platelets 07/25/2022 248  150 - 400 K/uL Final   nRBC 07/25/2022 0.0  0.0 - 0.2 % Final   Neutrophils Relative % 07/25/2022 43  % Final   Neutro Abs 07/25/2022 3.6  1.7 - 7.7 K/uL Final   Lymphocytes Relative 07/25/2022 52  % Final   Lymphs Abs 07/25/2022 4.2 (H)  0.7 - 4.0 K/uL Final   Monocytes Relative 07/25/2022 5  % Final   Monocytes Absolute 07/25/2022 0.4  0.1 - 1.0 K/uL Final   Eosinophils Relative 07/25/2022 0  % Final   Eosinophils Absolute 07/25/2022 0.0  0.0 - 0.5 K/uL Final   Basophils Relative 07/25/2022 0  % Final   Basophils Absolute 07/25/2022 0.0  0.0 - 0.1 K/uL Final   Immature Granulocytes 07/25/2022 0  % Final   Abs Immature Granulocytes 07/25/2022 0.02  0.00 - 0.07 K/uL Final   Performed at [facility], [address] [place], [place] [address]   Sodium 07/25/2022 138  135 - 145 mmol/L Final   Potassium 07/25/2022 4.0  3.5 - 5.1 mmol/L Final   Chloride 07/25/2022 104  98 - 111 mmol/L Final   CO2 07/25/2022 29  22 - 32 mmol/L Final   Glucose, Bld 07/25/2022 83  70 - 99 mg/dL Final   Glucose reference range applies only to samples taken after fasting for at least 8 hours.   BUN 07/25/2022 11  6 - 20 mg/dL Final   Creatinine, Ser 07/25/2022 0.97  0.44 - 1.00 mg/dL Final   Calcium 30/86/5784 9.2  8.9 - 10.3 mg/dL Final   Total Protein 69/62/9528 7.0  6.5 - 8.1 g/dL Final   Albumin 41/32/4401 3.8  3.5 - 5.0 g/dL Final   AST 02/72/5366 18  15 - 41 U/L Final   ALT 07/25/2022 22  0 - 44 U/L Final   Alkaline Phosphatase 07/25/2022 64  38 - 126 U/L Final   Total  Bilirubin 07/25/2022 0.2 (L)  0.3 - 1.2 mg/dL Final   GFR, Estimated 07/25/2022 >60  >60 mL/min Final   Comment: (NOTE) Calculated using the CKD-EPI Creatinine Equation (2021)    Anion gap 07/25/2022 5   5 - 15 Final   Performed at [facility], [address] [place], [place] [address]   POC Amphetamine UR 07/25/2022 None Detected  NONE DETECTED (Cut Off Level 1000 ng/mL) Preliminary   POC Secobarbital (BAR) 07/25/2022 None Detected  NONE DETECTED (Cut Off Level 300 ng/mL) Preliminary   POC Buprenorphine (BUP) 07/25/2022 None Detected  NONE DETECTED (Cut Off Level 10 ng/mL) Preliminary   POC Oxazepam (BZO) 07/25/2022 None Detected  NONE DETECTED (Cut Off Level 300 ng/mL) Preliminary   POC Cocaine UR 07/25/2022 None Detected  NONE DETECTED (Cut Off Level 300 ng/mL) Preliminary   POC Methamphetamine UR 07/25/2022 None Detected  NONE DETECTED (Cut Off Level 1000 ng/mL) Preliminary   POC Morphine 07/25/2022 None Detected  NONE DETECTED (Cut Off Level 300 ng/mL) Preliminary   POC Methadone UR 07/25/2022 None Detected  NONE DETECTED (Cut Off Level 300 ng/mL) Preliminary   POC Oxycodone UR 07/25/2022 None Detected  NONE DETECTED (Cut Off Level 100 ng/mL) Preliminary   POC Marijuana UR 07/25/2022 None Detected  NONE DETECTED (Cut Off Level 50 ng/mL) Preliminary   SARSCOV2ONAVIRUS 2 AG 07/25/2022 NEGATIVE  NEGATIVE Final   Comment: (NOTE) SARS-CoV-2 antigen NOT DETECTED.   Negative results are presumptive.  Negative results do not preclude SARS-CoV-2 infection and should not be used as the sole basis for treatment or other patient management decisions, including infection  control decisions, particularly in the presence of clinical signs and  symptoms consistent with COVID-19, or in those who have been in contact with the virus.  Negative results must be combined with clinical observations, patient history, and epidemiological information. The expected result is Negative.  Fact Sheet for Patients: https://www.jennings-kim.com/  Fact Sheet for Healthcare Providers: https://alexander-rogers.biz/  This test is not yet approved or cleared by the [place] FDA and   has been authorized for detection and/or diagnosis of SARS-CoV-2 by FDA under an Emergency Use Authorization (EUA).  This EUA will remain in effect (meaning this test can be used) for the duration of  the COV                          ID-19 declaration under Section 564(b)(1) of the Act, 21 U.S.C. section 360bbb-3(b)(1), unless the authorization is terminated or revoked sooner.     Cholesterol 07/25/2022 181  0 - 200 mg/dL Final   Triglycerides 93/57/0177 118  <150 mg/dL Final   HDL 93/90/3009 45  >40 mg/dL Final   Total CHOL/HDL Ratio 07/25/2022 4.0  RATIO Final   VLDL 07/25/2022 24  0 - 40 mg/dL Final   LDL Cholesterol 07/25/2022 112 (H)  0 - 99 mg/dL Final   Comment:        Total Cholesterol/HDL:CHD Risk Coronary Heart Disease Risk Table                     Men   Women  1/2 Average Risk   3.4   3.3  Average Risk       5.0   4.4  2 X Average Risk   9.6   7.1  3 X Average Risk  23.4   11.0        Use the calculated Patient Ratio above  and the CHD Risk Table to determine the patient's CHD Risk.        ATP III CLASSIFICATION (LDL):  <100     mg/dL   Optimal  161-096  mg/dL   Near or Above                    Optimal  130-159  mg/dL   Borderline  045-409  mg/dL   High  >811     mg/dL   Very High Performed at [facility], [address], [place], [place] [address]    TSH 07/25/2022 6.668 (H)  0.350 - 4.500 uIU/mL Final   Comment: Performed by a 3rd Generation assay with a functional sensitivity of <=0.01 uIU/mL. Performed at [facility], [address], [place], [place] [address]    Glucose-Capillary 07/26/2022 104 (H)  70 - 99 mg/dL Final   Glucose reference range applies only to samples taken after fasting for at least 8 hours.   T3, Free 07/31/2022 2.3  2.0 - 4.4 pg/mL Final   Comment: (NOTE) Performed At: [facility] [address] [place], [place] [id number] Jolene Schimke MD ON:[id number]    Free T4 07/31/2022 0.60 (L)  0.61 - 1.12 ng/dL  Final   Comment: (NOTE) Biotin ingestion may interfere with free T4 tests. If the results are inconsistent with the TSH level, previous test results, or the clinical presentation, then consider biotin interference. If needed, order repeat testing after stopping biotin. Performed at [facility], [address], [place], [place] [address]    Glucose-Capillary 08/29/2022 100 (H)  70 - 99 mg/dL Final   Glucose reference range applies only to samples taken after fasting for at least 8 hours.   TSH 09/05/2022 0.793  0.350 - 4.500 uIU/mL Final   Comment: Performed by a 3rd Generation assay with a functional sensitivity of <=0.01 uIU/mL. Performed at [facility], [address], [place], [place] [address]    Free T4 09/05/2022 0.73  0.61 - 1.12 ng/dL Final   Comment: (NOTE) Biotin ingestion may interfere with free T4 tests. If the results are inconsistent with the TSH level, previous test results, or the clinical presentation, then consider biotin interference. If needed, order repeat testing after stopping biotin. Performed at [facility], [address], [place], [place] [address]    Preg Test, Ur 09/06/2022 Negative  Negative Final   Preg Test, Ur 09/06/2022 NEGATIVE  NEGATIVE Final   Comment:        THE SENSITIVITY OF THIS METHODOLOGY IS >24 mIU/mL    Preg Test, Ur 09/05/2022 NEGATIVE  NEGATIVE Final   Comment:        THE SENSITIVITY OF THIS METHODOLOGY IS >24 mIU/mL    Sodium 09/13/2022 136  135 - 145 mmol/L Final   Potassium 09/13/2022 4.5  3.5 - 5.1 mmol/L Final   Chloride 09/13/2022 105  98 - 111 mmol/L Final   CO2 09/13/2022 21 (L)  22 - 32 mmol/L Final   Glucose, Bld 09/13/2022 111 (H)  70 - 99 mg/dL Final   Glucose reference range applies only to samples taken after fasting for at least 8 hours.   BUN 09/13/2022 14  6 - 20 mg/dL Final   Creatinine, Ser 09/13/2022 0.92  0.44 - 1.00 mg/dL Final   Calcium 34/74/2595 9.1  8.9 - 10.3 mg/dL Final   GFR,  Estimated 09/13/2022 >60  >60 mL/min Final   Comment: (NOTE) Calculated  using the CKD-EPI Creatinine Equation (2021)    Anion gap 09/13/2022 10  5 - 15 Final   Performed at [facility], [address], [place], [place] [address]   Vitamin B-12 09/13/2022 585  180 - 914 pg/mL Final   Comment: (NOTE) This assay is not validated for testing neonatal or myeloproliferative syndrome specimens for Vitamin B12 levels. Performed at [facility], [address] [place], [place] [address]    Color, Urine 09/14/2022 YELLOW  YELLOW Final   APPearance 09/14/2022 HAZY (A)  CLEAR Final   Specific Gravity, Urine 09/14/2022 1.025  1.005 - 1.030 Final   pH 09/14/2022 5.0  5.0 - 8.0 Final   Glucose, UA 09/14/2022 NEGATIVE  NEGATIVE mg/dL Final   Hgb urine dipstick 09/14/2022 NEGATIVE  NEGATIVE Final   Bilirubin Urine 09/14/2022 NEGATIVE  NEGATIVE Final   Ketones, ur 09/14/2022 NEGATIVE  NEGATIVE mg/dL Final   Protein, ur 46/50/3546 NEGATIVE  NEGATIVE mg/dL Final   Nitrite 56/81/2751 NEGATIVE  NEGATIVE Final   Leukocytes,Ua 09/14/2022 TRACE (A)  NEGATIVE Final   RBC / HPF 09/14/2022 0-5  0 - 5 RBC/hpf Final   WBC, UA 09/14/2022 0-5  0 - 5 WBC/hpf Final   Bacteria, UA 09/14/2022 RARE (A)  NONE SEEN Final   Squamous Epithelial / HPF 09/14/2022 0-5  0 - 5 Final   Mucus 09/14/2022 PRESENT   Final   Performed at [facility], [address] [place], [place] [address]   Glucose-Capillary 09/16/2022 105 (H)  70 - 99 mg/dL Final   Glucose reference range applies only to samples taken after fasting for at least 8 hours.   SARS Coronavirus 2 by RT PCR 09/30/2022 NEGATIVE  NEGATIVE Final   Comment: (NOTE) SARS-CoV-2 target nucleic acids are NOT DETECTED.  The SARS-CoV-2 RNA is generally detectable in upper respiratory specimens during the acute phase of infection. The lowest concentration of SARS-CoV-2 viral copies this assay can detect is 138 copies/mL. A negative result does not  preclude SARS-Cov-2 infection and should not be used as the sole basis for treatment or other patient management decisions. A negative result may occur with  improper specimen collection/handling, submission of specimen other than nasopharyngeal swab, presence of viral mutation(s) within the areas targeted by this assay, and inadequate number of viral copies(<138 copies/mL). A negative result must be combined with clinical observations, patient history, and epidemiological information. The expected result is Negative.  Fact Sheet for Patients:  BloggerCourse.com  Fact Sheet for Healthcare Providers:  SeriousBroker.it  This test is no                          t yet approved or cleared by the [place] FDA and  has been authorized for detection and/or diagnosis of SARS-CoV-2 by FDA under an Emergency Use Authorization (EUA). This EUA will remain  in effect (meaning this test can be used) for the duration of the COVID-19 declaration under Section 564(b)(1) of the Act, 21 U.S.C.section 360bbb-3(b)(1), unless the authorization is terminated  or revoked sooner.       Influenza A by PCR 09/30/2022 NEGATIVE  NEGATIVE Final   Influenza B by PCR 09/30/2022 NEGATIVE  NEGATIVE Final   Comment: (NOTE) The Xpert Xpress SARS-CoV-2/FLU/RSV plus assay is intended as an aid in the diagnosis of influenza from Nasopharyngeal swab specimens and should not be used as a sole basis for treatment. Nasal washings and aspirates are unacceptable for  Xpert Xpress SARS-CoV-2/FLU/RSV testing.  Fact Sheet for Patients: EntrepreneurPulse.com.au  Fact Sheet for Healthcare Providers: IncredibleEmployment.be  This test is not yet approved or cleared by the [place] FDA and has been authorized for detection and/or diagnosis of SARS-CoV-2 by FDA under an Emergency Use Authorization (EUA). This EUA will remain in  effect (meaning this test can be used) for the duration of the COVID-19 declaration under Section 564(b)(1) of the Act, 21 U.S.C. section 360bbb-3(b)(1), unless the authorization is terminated or revoked.     Resp Syncytial Virus by PCR 09/30/2022 NEGATIVE  NEGATIVE Final   Comment: (NOTE) Fact Sheet for Patients: EntrepreneurPulse.com.au  Fact Sheet for Healthcare Providers: IncredibleEmployment.be  This test is not yet approved or cleared by the [place] FDA and has been authorized for detection and/or diagnosis of SARS-CoV-2 by FDA under an Emergency Use Authorization (EUA). This EUA will remain in effect (meaning this test can be used) for the duration of the COVID-19 declaration under Section 564(b)(1) of the Act, 21 U.S.C. section 360bbb-3(b)(1), unless the authorization is terminated or revoked.  Performed at [facility], [place] [address], [place], [place] [address]    Sodium 10/01/2022 136  135 - 145 mmol/L Final   Potassium 10/01/2022 3.8  3.5 - 5.1 mmol/L Final   Chloride 10/01/2022 104  98 - 111 mmol/L Final   CO2 10/01/2022 27  22 - 32 mmol/L Final   Glucose, Bld 10/01/2022 98  70 - 99 mg/dL Final   Glucose reference range applies only to samples taken after fasting for at least 8 hours.   BUN 10/01/2022 12  6 - 20 mg/dL Final   Creatinine, Ser 10/01/2022 0.98  0.44 - 1.00 mg/dL Final   Calcium 10/01/2022 8.9  8.9 - 10.3 mg/dL Final   GFR, Estimated 10/01/2022 >60  >60 mL/min Final   Comment: (NOTE) Calculated using the CKD-EPI Creatinine Equation (2021)    Anion gap 10/01/2022 5  5 - 15 Final   Performed at [facility], [place] [address], [place], [place] [address]   WBC 10/01/2022 5.1  4.0 - 10.5 K/uL Final   RBC 10/01/2022 4.31  3.87 - 5.11 MIL/uL Final   Hemoglobin 10/01/2022 12.7  12.0 - 15.0 g/dL Final   HCT 10/01/2022 38.8  36.0 - 46.0 % Final   MCV 10/01/2022 90.0  80.0 - 100.0 fL Final   MCH 10/01/2022  29.5  26.0 - 34.0 pg Final   MCHC 10/01/2022 32.7  30.0 - 36.0 g/dL Final   RDW 10/01/2022 12.4  11.5 - 15.5 % Final   Platelets 10/01/2022 300  150 - 400 K/uL Final   nRBC 10/01/2022 0.0  0.0 - 0.2 % Final   Performed at [place] [address] [place], [place] [address]   Lipase 10/01/2022 29  11 - 51 U/L Final   Performed at [place] [address], [place], [place] [address]   Color, Urine 10/05/2022 YELLOW  YELLOW Final   APPearance 10/05/2022 HAZY (A)  CLEAR Final   Specific Gravity, Urine 10/05/2022 1.018  1.005 - 1.030 Final   pH 10/05/2022 5.0  5.0 - 8.0 Final   Glucose, UA 10/05/2022 NEGATIVE  NEGATIVE mg/dL Final   Hgb urine dipstick 10/05/2022 NEGATIVE  NEGATIVE Final   Bilirubin Urine 10/05/2022 NEGATIVE  NEGATIVE Final   Ketones, ur 10/05/2022 NEGATIVE  NEGATIVE mg/dL Final   Protein, ur 10/05/2022 NEGATIVE  NEGATIVE mg/dL Final   Nitrite 10/05/2022 NEGATIVE  NEGATIVE Final   Leukocytes,Ua 10/05/2022 TRACE (A)  NEGATIVE Final   RBC / HPF 10/05/2022 0-5  0 - 5 RBC/hpf Final   WBC, UA 10/05/2022 0-5  0 - 5 WBC/hpf Final   Bacteria, UA 10/05/2022 RARE (A)  NONE SEEN Final   Squamous Epithelial / HPF 10/05/2022 0-5  0 - 5 Final   Mucus 10/05/2022 PRESENT   Final   Performed at [facility], [address] [place], [place] [address]   Specimen Description 10/05/2022 URINE, CLEAN CATCH   Final   Special Requests 10/05/2022    Final                   Value:NONE Performed at [facility], [address], [place], [place] [address]    Culture 10/05/2022 10,000 COLONIES/mL MULTIPLE SPECIES PRESENT, SUGGEST RECOLLECTION (A)   Final   Report Status 10/05/2022 10/07/2022 FINAL   Final  Admission on 07/04/2022, Discharged on 07/05/2022  Component Date Value Ref Range Status   Sodium 07/04/2022 142  135 - 145 mmol/L Final   Potassium 07/04/2022 4.4  3.5 - 5.1 mmol/L Final   Chloride 07/04/2022 109  98 - 111 mmol/L Final   CO2 07/04/2022 26  22 -  32 mmol/L Final   Glucose, Bld 07/04/2022 96  70 - 99 mg/dL Final   Glucose reference range applies only to samples taken after fasting for at least 8 hours.   BUN 07/04/2022 15  6 - 20 mg/dL Final   Creatinine, Ser 07/04/2022 0.83  0.44 - 1.00 mg/dL Final   Calcium 09/81/1914 9.3  8.9 - 10.3 mg/dL Final   Total Protein 78/29/5621 7.2  6.5 - 8.1 g/dL Final   Albumin 30/86/5784 3.9  3.5 - 5.0 g/dL Final   AST 69/62/9528 23  15 - 41 U/L Final   ALT 07/04/2022 28  0 - 44 U/L Final   Alkaline Phosphatase 07/04/2022 77  38 - 126 U/L Final   Total Bilirubin 07/04/2022 0.4  0.3 - 1.2 mg/dL Final   GFR, Estimated 07/04/2022 >60  >60 mL/min Final   Comment: (NOTE) Calculated using the CKD-EPI Creatinine Equation (2021)    Anion gap 07/04/2022 7  5 - 15 Final   Performed at [facility], [address], [place], [place] [address]   Alcohol, Ethyl (B) 07/04/2022 <10  <10 mg/dL Final   Comment: (NOTE) Lowest detectable limit for serum alcohol is 10 mg/dL.  For medical purposes only. Performed at [facility], [address] [place], [place] [address]    WBC 07/04/2022 7.0  4.0 - 10.5 K/uL Final   RBC 07/04/2022 4.18  3.87 - 5.11 MIL/uL Final   Hemoglobin 07/04/2022 12.8  12.0 - 15.0 g/dL Final   HCT 72/53/6644 39.1  36.0 - 46.0 % Final   MCV 07/04/2022 93.5  80.0 - 100.0 fL Final   MCH 07/04/2022 30.6  26.0 - 34.0 pg Final   MCHC 07/04/2022 32.7  30.0 - 36.0 g/dL Final   RDW 03/47/4259 12.9  11.5 - 15.5 % Final   Platelets 07/04/2022 243  150 - 400 K/uL Final   nRBC 07/04/2022 0.0  0.0 - 0.2 % Final   Neutrophils Relative % 07/04/2022 43  % Final   Neutro Abs 07/04/2022 3.0  1.7 - 7.7 K/uL Final   Lymphocytes Relative 07/04/2022 50  % Final   Lymphs Abs 07/04/2022 3.5  0.7 - 4.0 K/uL Final   Monocytes Relative  07/04/2022 7  % Final   Monocytes Absolute 07/04/2022 0.5  0.1 - 1.0 K/uL Final   Eosinophils Relative 07/04/2022 0  % Final   Eosinophils  Absolute 07/04/2022 0.0  0.0 - 0.5 K/uL Final   Basophils Relative 07/04/2022 0  % Final   Basophils Absolute 07/04/2022 0.0  0.0 - 0.1 K/uL Final   Immature Granulocytes 07/04/2022 0  % Final   Abs Immature Granulocytes 07/04/2022 0.01  0.00 - 0.07 K/uL Final   Performed at [facility], [address], [place], [place] [address]   I-stat hCG, quantitative 07/04/2022 <5.0  <5 mIU/mL Final   Comment 3 07/04/2022          Final   Comment:   GEST. AGE      CONC.  (mIU/mL)   <=1 WEEK        5 - 50     2 WEEKS       50 - 500     3 WEEKS       100 - 10,000     4 WEEKS     1,000 - 30,000        FEMALE AND NON-PREGNANT FEMALE:     LESS THAN 5 mIU/mL   Admission on 06/14/2022, Discharged on 06/14/2022  Component Date Value Ref Range Status   Color, UA 06/14/2022 yellow  yellow Final   Clarity, UA 06/14/2022 cloudy (A)  clear Final   Glucose, UA 06/14/2022 negative  negative mg/dL Final   Bilirubin, UA 60/45/4098 negative  negative Final   Ketones, POC UA 06/14/2022 negative  negative mg/dL Final   Spec Grav, UA 11/91/4782 1.025  1.010 - 1.025 Final   Blood, UA 06/14/2022 negative  negative Final   pH, UA 06/14/2022 7.5  5.0 - 8.0 Final   Protein Ur, POC 06/14/2022 =30 (A)  negative mg/dL Final   Urobilinogen, UA 06/14/2022 0.2  0.2 or 1.0 E.U./dL Final   Nitrite, UA 95/62/1308 Negative  Negative Final   Leukocytes, UA 06/14/2022 Negative  Negative Final   Preg Test, Ur 06/14/2022 Negative  Negative Final   Specimen Description 06/14/2022 URINE, CLEAN CATCH   Final   Special Requests 06/14/2022    Final                   Value:NONE Performed at [facility], [address] [place], [place] [address]    Culture 06/14/2022 MULTIPLE SPECIES PRESENT, SUGGEST RECOLLECTION (A)   Final   Report Status 06/14/2022 06/16/2022 FINAL   Final  Admission on 06/07/2022, Discharged on 06/10/2022  Component Date Value Ref Range Status   SARS Coronavirus 2 by RT PCR 06/07/2022  NEGATIVE  NEGATIVE Final   Comment: (NOTE) SARS-CoV-2 target nucleic acids are NOT DETECTED.  The SARS-CoV-2 RNA is generally detectable in upper respiratory specimens during the acute phase of infection. The lowest concentration of SARS-CoV-2 viral copies this assay can detect is 138 copies/mL. A negative result does not preclude SARS-Cov-2 infection and should not be used as the sole basis for treatment or other patient management decisions. A negative result may occur with  improper specimen collection/handling, submission of specimen other than nasopharyngeal swab, presence of viral mutation(s) within the areas targeted by this assay, and inadequate number of viral copies(<138 copies/mL). A negative result must be combined with clinical observations, patient history, and epidemiological information. The expected result is Negative.  Fact Sheet for Patients:  BloggerCourse.com  Fact Sheet for Healthcare Providers:  SeriousBroker.ithttps://www.fda.gov/media/152162/download  This test is no                          t yet approved or cleared by the [place] FDA and  has been authorized for detection and/or diagnosis of SARS-CoV-2 by FDA under an Emergency Use Authorization (EUA). This EUA will remain  in effect (meaning this test can be used) for the duration of the COVID-19 declaration under Section 564(b)(1) of the Act, 21 U.S.C.section 360bbb-3(b)(1), unless the authorization is terminated  or revoked sooner.       Influenza A by PCR 06/07/2022 NEGATIVE  NEGATIVE Final   Influenza B by PCR 06/07/2022 NEGATIVE  NEGATIVE Final   Comment: (NOTE) The Xpert Xpress SARS-CoV-2/FLU/RSV plus assay is intended as an aid in the diagnosis of influenza from Nasopharyngeal swab specimens and should not be used as a sole basis for treatment. Nasal washings and aspirates are unacceptable for Xpert Xpress SARS-CoV-2/FLU/RSV testing.  Fact Sheet for  Patients: BloggerCourse.comhttps://www.fda.gov/media/152166/download  Fact Sheet for Healthcare Providers: SeriousBroker.ithttps://www.fda.gov/media/152162/download  This test is not yet approved or cleared by the [place] FDA and has been authorized for detection and/or diagnosis of SARS-CoV-2 by FDA under an Emergency Use Authorization (EUA). This EUA will remain in effect (meaning this test can be used) for the duration of the COVID-19 declaration under Section 564(b)(1) of the Act, 21 U.S.C. section 360bbb-3(b)(1), unless the authorization is terminated or revoked.  Performed at [facility], [address] [place], [place] [address]    WBC 06/07/2022 5.7  4.0 - 10.5 K/uL Final   RBC 06/07/2022 4.39  3.87 - 5.11 MIL/uL Final   Hemoglobin 06/07/2022 13.2  12.0 - 15.0 g/dL Final   HCT 10/27/253608/25/2023 40.4  36.0 - 46.0 % Final   MCV 06/07/2022 92.0  80.0 - 100.0 fL Final   MCH 06/07/2022 30.1  26.0 - 34.0 pg Final   MCHC 06/07/2022 32.7  30.0 - 36.0 g/dL Final   RDW 64/40/347408/25/2023 12.4  11.5 - 15.5 % Final   Platelets 06/07/2022 308  150 - 400 K/uL Final   nRBC 06/07/2022 0.0  0.0 - 0.2 % Final   Neutrophils Relative % 06/07/2022 42  % Final   Neutro Abs 06/07/2022 2.4  1.7 - 7.7 K/uL Final   Lymphocytes Relative 06/07/2022 54  % Final   Lymphs Abs 06/07/2022 3.1  0.7 - 4.0 K/uL Final   Monocytes Relative 06/07/2022 4  % Final   Monocytes Absolute 06/07/2022 0.2  0.1 - 1.0 K/uL Final   Eosinophils Relative 06/07/2022 0  % Final   Eosinophils Absolute 06/07/2022 0.0  0.0 - 0.5 K/uL Final   Basophils Relative 06/07/2022 0  % Final   Basophils Absolute 06/07/2022 0.0  0.0 - 0.1 K/uL Final   Immature Granulocytes 06/07/2022 0  % Final   Abs Immature Granulocytes 06/07/2022 0.01  0.00 - 0.07 K/uL Final   Performed at [facility], [address] [place], [place] [address]   Sodium 06/07/2022 139  135 - 145 mmol/L Final   Potassium 06/07/2022 4.0  3.5 - 5.1 mmol/L Final   Chloride 06/07/2022 104  98 -  111 mmol/L Final   CO2 06/07/2022 28  22 - 32 mmol/L Final   Glucose, Bld 06/07/2022 104 (H)  70 - 99 mg/dL Final   Glucose reference range applies only to samples taken after fasting for at least 8 hours.   BUN 06/07/2022 8  6 - 20 mg/dL Final   Creatinine, Ser 06/07/2022 0.84  0.44 - 1.00 mg/dL Final   Calcium 16/10/960408/25/2023 9.1  8.9 - 10.3 mg/dL Final   Total Protein 54/09/811908/25/2023 6.9  6.5 - 8.1 g/dL Final   Albumin 14/78/295608/25/2023 3.7  3.5 - 5.0 g/dL Final   AST 21/30/865708/25/2023 19  15 - 41 U/L Final   ALT 06/07/2022 22  0 - 44 U/L Final   Alkaline Phosphatase 06/07/2022 54  38 - 126 U/L Final   Total Bilirubin 06/07/2022 0.4  0.3 - 1.2 mg/dL Final   GFR, Estimated 06/07/2022 >60  >60 mL/min Final   Comment: (NOTE) Calculated using the CKD-EPI Creatinine Equation (2021)    Anion gap 06/07/2022 7  5 - 15 Final   Performed at [facility], [address] [place], [place] [address]   Hgb A1c MFr Bld 06/07/2022 5.0  4.8 - 5.6 % Final   Comment: (NOTE) Pre diabetes:          5.7%-6.4%  Diabetes:              >6.4%  Glycemic control for   <7.0% adults with diabetes    Mean Plasma Glucose 06/07/2022 96.8  mg/dL Final   Performed at [facility], [address] [place] [address] [place], [place] [address]   TSH 06/07/2022 1.620  0.350 - 4.500 uIU/mL Final   Comment: Performed by a 3rd Generation assay with a functional sensitivity of <=0.01 uIU/mL. Performed at [facility], [address] [place], [place] [address]    RPR Ser Ql 06/07/2022 NON REACTIVE  NON REACTIVE Final   Performed at [facility], [address] [place], [place] [address]   Color, Urine 06/07/2022 YELLOW  YELLOW Final   APPearance 06/07/2022 HAZY (A)  CLEAR Final   Specific Gravity, Urine 06/07/2022 1.018  1.005 - 1.030 Final   pH 06/07/2022 7.0  5.0 - 8.0 Final   Glucose, UA 06/07/2022 NEGATIVE  NEGATIVE mg/dL Final   Hgb urine dipstick 06/07/2022 NEGATIVE  NEGATIVE Final   Bilirubin Urine 06/07/2022 NEGATIVE   NEGATIVE Final   Ketones, ur 06/07/2022 NEGATIVE  NEGATIVE mg/dL Final   Protein, ur 72/53/664408/25/2023 NEGATIVE  NEGATIVE mg/dL Final   Nitrite 03/47/425908/25/2023 NEGATIVE  NEGATIVE Final   Leukocytes,Ua 06/07/2022 NEGATIVE  NEGATIVE Final   Performed at [facility], [address] [place], [place] [address]   Cholesterol 06/07/2022 164  0 - 200 mg/dL Final   Triglycerides 56/43/329508/25/2023 158 (H)  <150 mg/dL Final   HDL 18/84/166008/25/2023 44  >40 mg/dL Final   Total CHOL/HDL Ratio 06/07/2022 3.7  RATIO Final   VLDL 06/07/2022 32  0 - 40 mg/dL Final   LDL Cholesterol 06/07/2022 88  0 - 99 mg/dL Final   Comment:        Total Cholesterol/HDL:CHD Risk Coronary Heart Disease Risk Table                     Men   Women  1/2 Average Risk   3.4   3.3  Average Risk       5.0   4.4  2 X Average Risk   9.6   7.1  3 X Average Risk  23.4   11.0        Use the calculated Patient Ratio above and the CHD Risk Table to determine the patient's CHD Risk.        ATP III CLASSIFICATION (LDL):  <  100     mg/dL   Optimal  [id number]  mg/dL   Near or Above                    Optimal  130-159  mg/dL   Borderline  [id number]  mg/dL   High  >811>190     mg/dL   Very High Performed at [facility], [address] [place], [place] [address]    HIV Screen 4th Generation wRfx 06/07/2022 Non Reactive  Non Reactive Final   Performed at [facility], [address] [place], [place] [address]   SARSCOV2ONAVIRUS 2 AG 06/07/2022 NEGATIVE  NEGATIVE Final   Comment: (NOTE) SARS-CoV-2 antigen NOT DETECTED.   Negative results are presumptive.  Negative results do not preclude SARS-CoV-2 infection and should not be used as the sole basis for treatment or other patient management decisions, including infection  control decisions, particularly in the presence of clinical signs and  symptoms consistent with COVID-19, or in those who have been in contact with the virus.  Negative results must be combined with clinical observations,  patient history, and epidemiological information. The expected result is Negative.  Fact Sheet for Patients: https://www.jennings-kim.com/https://www.fda.gov/media/141569/download  Fact Sheet for Healthcare Providers: https://alexander-rogers.biz/https://www.fda.gov/media/141568/download  This test is not yet approved or cleared by the [place] FDA and  has been authorized for detection and/or diagnosis of SARS-CoV-2 by FDA under an Emergency Use Authorization (EUA).  This EUA will remain in effect (meaning this test can be used) for the duration of  the COV                          ID-19 declaration under Section 564(b)(1) of the Act, 21 U.S.C. section 360bbb-3(b)(1), unless the authorization is terminated or revoked sooner.     POC Amphetamine UR 06/07/2022 None Detected  NONE DETECTED (Cut Off Level 1000 ng/mL) Final   POC Secobarbital (BAR) 06/07/2022 None Detected  NONE DETECTED (Cut Off Level 300 ng/mL) Final   POC Buprenorphine (BUP) 06/07/2022 None Detected  NONE DETECTED (Cut Off Level 10 ng/mL) Final   POC Oxazepam (BZO) 06/07/2022 None Detected  NONE DETECTED (Cut Off Level 300 ng/mL) Final   POC Cocaine UR 06/07/2022 None Detected  NONE DETECTED (Cut Off Level 300 ng/mL) Final   POC Methamphetamine UR 06/07/2022 None Detected  NONE DETECTED (Cut Off Level 1000 ng/mL) Final   POC Morphine 06/07/2022 None Detected  NONE DETECTED (Cut Off Level 300 ng/mL) Final   POC Methadone UR 06/07/2022 None Detected  NONE DETECTED (Cut Off Level 300 ng/mL) Final   POC Oxycodone UR 06/07/2022 None Detected  NONE DETECTED (Cut Off Level 100 ng/mL) Final   POC Marijuana UR 06/07/2022 None Detected  NONE DETECTED (Cut Off Level 50 ng/mL) Final   Preg Test, Ur 06/08/2022 NEGATIVE  NEGATIVE Final   Comment:        THE SENSITIVITY OF THIS METHODOLOGY IS >20 mIU/mL. Performed at [facility], [address] [place], [place] [address]    Preg Test, Ur 06/08/2022 NEGATIVE  NEGATIVE Final   Comment:        THE SENSITIVITY OF  THIS METHODOLOGY IS >24 mIU/mL     Blood Alcohol level:  Lab Results  Component Value Date   ETH <10 07/04/2022   ETH <10 02/07/2022    Metabolic Disorder Labs: Lab Results  Component Value Date   HGBA1C 5.7 (H) 10/15/2022  MPG 117 10/15/2022   MPG 96.8 06/07/2022   No results found for: "PROLACTIN" Lab Results  Component Value Date   CHOL 181 07/25/2022   TRIG 118 07/25/2022   HDL 45 07/25/2022   CHOLHDL 4.0 07/25/2022   VLDL 24 07/25/2022   LDLCALC 112 (H) 07/25/2022   LDLCALC 88 06/07/2022    Therapeutic Lab Levels: No results found for: "LITHIUM" No results found for: "VALPROATE" No results found for: "CBMZ"  Physical Findings   GAD-7    Flowsheet Row Office Visit from 12/17/2021 in [facility] [place] [facility] [place] [facility] from 10/07/2022 in [facility] ED from 06/07/2022 in [facility] Office Visit from 12/17/2021 in [facility] for [place] at [facility] Total Score 0 1 2  PHQ-9 Total Score -- 2 [place] ED from 10/07/2022 in [facility] Most recent reading at 11/07/2022  9:32 AM ED from 10/07/2022 in [facility] Emergency Department at [facility] Most recent reading at 10/07/2022 12:18 PM ED from 07/25/2022 in [facility] Most recent reading at 10/06/2022  9:38 AM  C-SSRS RISK CATEGORY No Risk No Risk No Risk        Musculoskeletal  Strength & Muscle Tone: within normal limits Gait & Station: normal Patient leans: N/A  Psychiatric Specialty Exam  Presentation  General Appearance:  Appropriate for Environment; Fairly Groomed; Casual  Eye Contact: Fair  Speech: Clear and Coherent; Normal Rate  Speech Volume: Normal  Handedness: Right   Mood and Affect  Mood: Euthymic  Affect: Full Range   Thought Process   Thought Processes: Coherent; Goal Directed  Descriptions of Associations:Intact  Orientation:Full (Time, Place and Person)  Thought Content:Logical  Diagnosis of Schizophrenia or Schizoaffective disorder in past: Yes  Duration of Psychotic Symptoms: Greater than six months   Hallucinations:Hallucinations: None  Ideas of Reference:None  Suicidal Thoughts:Suicidal Thoughts: No  Homicidal Thoughts:Homicidal Thoughts: No   Sensorium  Memory: Immediate Good  Judgment: Good  Insight: Good   Executive Functions  Concentration: Good  Attention Span: Good  Recall: Good  Fund of Knowledge: Good  Language: Good   Psychomotor Activity  Psychomotor Activity: Psychomotor Activity: Normal   Assets  Assets: Communication Skills; Desire for Improvement; Financial Resources/Insurance; Leisure Time; Physical Health; Resilience; Social Support   Sleep  Sleep: Sleep: Good   No data recorded  Physical Exam  Physical Exam Constitutional:      General: She is not in acute distress.    Appearance: She is not ill-appearing, toxic-appearing or diaphoretic.  Eyes:     Conjunctiva/sclera: Conjunctivae normal.  Cardiovascular:     Rate and Rhythm: Normal rate.  Pulmonary:     Effort: Pulmonary effort is normal. No respiratory distress.  Neurological:     Mental Status: She is alert and oriented to person, place, and time.  Psychiatric:        Attention and Perception: Attention and perception normal.        Mood and Affect: Mood and affect normal.        Speech: Speech normal.        Behavior: Behavior normal. Behavior is cooperative.        Thought Content: Thought content normal.    Review of Systems  Constitutional:  Negative for chills and fever.  Respiratory:  Negative for cough and shortness of breath.   Cardiovascular:  Negative for chest pain and palpitations.  Gastrointestinal:  Negative for abdominal pain.  Neurological:  Negative for  headaches.   Blood pressure 119/83, pulse 98, temperature 98.1 F (36.7 C), temperature source Oral, resp. rate 18, height 5\' 1"  (1.549 m), weight 198 lb (89.8 kg), SpO2 99 %. Body mass index is 37.41 kg/m.  Treatment Plan Summary: Plan Pt remains psychiatrically cleared. Disposition pending placement.  Tharon Aquas, NP 11/15/2022 10:04 AM

## 2022-11-15 NOTE — ED Notes (Signed)
Patient accepted scheduled meds w/o difficulty. Meals provided. Patient remains in her assigned area on the unit coloring. Denies SI/HI/AVH. Safety maintained and will continue to monitor.

## 2022-11-15 NOTE — ED Notes (Signed)
Pt is laying on her bed awake at this hour. No apparent distress. RR even and unlabored. Monitored for safety.

## 2022-11-16 DIAGNOSIS — N9489 Other specified conditions associated with female genital organs and menstrual cycle: Secondary | ICD-10-CM | POA: Diagnosis not present

## 2022-11-16 DIAGNOSIS — E039 Hypothyroidism, unspecified: Secondary | ICD-10-CM | POA: Diagnosis not present

## 2022-11-16 DIAGNOSIS — N390 Urinary tract infection, site not specified: Secondary | ICD-10-CM | POA: Diagnosis not present

## 2022-11-16 DIAGNOSIS — E118 Type 2 diabetes mellitus with unspecified complications: Secondary | ICD-10-CM | POA: Diagnosis not present

## 2022-11-16 DIAGNOSIS — F419 Anxiety disorder, unspecified: Secondary | ICD-10-CM | POA: Diagnosis not present

## 2022-11-16 DIAGNOSIS — F333 Major depressive disorder, recurrent, severe with psychotic symptoms: Secondary | ICD-10-CM | POA: Diagnosis not present

## 2022-11-16 DIAGNOSIS — Z1152 Encounter for screening for COVID-19: Secondary | ICD-10-CM | POA: Diagnosis not present

## 2022-11-16 DIAGNOSIS — F17200 Nicotine dependence, unspecified, uncomplicated: Secondary | ICD-10-CM | POA: Diagnosis not present

## 2022-11-16 DIAGNOSIS — F209 Schizophrenia, unspecified: Secondary | ICD-10-CM | POA: Diagnosis not present

## 2022-11-16 NOTE — ED Notes (Signed)
Pt working on origami at bedside.  Will continue to monitor for safety.

## 2022-11-16 NOTE — ED Notes (Signed)
[name]&Ox4. Patient denies SI/Hi and AVH. Denies any physical complaints when asked. No acute distress noted. Support and encouragement provided. Routine safety checks conducted according to facility protocol. Encouraged patient to notify staff if thoughts of harm toward self or others arise. Patient verbalize understanding and agreement. Patient watching television at this time. Will continue to monitor for safety.

## 2022-11-16 NOTE — ED Notes (Signed)
Pt resting quietly, breathing is even and unlabored.  Pt denies SI, HI, pain and AVH.  Will continue to monitor for safety.  

## 2022-11-16 NOTE — ED Notes (Signed)
Pt asleep at this hour. No apparent distress. RR even and unlabored. Monitored for safety.

## 2022-11-16 NOTE — ED Provider Notes (Signed)
Behavioral Health Progress Note  Date and Time: 11/16/2022 10:48 AM Name: Nicole [name] MRN:  [id number]  HPI:  Nicole [name] is a 32 y/o female with PMH of SCZ unspecified, MDD with psychosis, who [facility] ([facility] via [employer] after a verbal altercation with Jeannett SeniorJosephine Okeke (not patient's legal guardian) for transient SI. This is patient[facility] similar concerns this year. Patient has been dismissed from previous group home due to threatening SI and HI, then leaving the group home. 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   Subjective:    On today's assessment patient is observed laying in her bed asleep.  She is easily awakened and pleasant.  She is alert/oriented x 4, cooperative and attentive.  She denies any concerns with appetite or sleep.  She continues to deny depression.  She has a euthymic affect.  She denies HI/SI/AVH.  Objectively does not appear to be responding to internal/external stimuli.  She is excited that she may be approved for independent living.  Patient remains psychiatrically cleared.  TOC/social worker/DSS continues to seek placement.   Diagnosis:  Final diagnoses:  Tobacco use disorder  Schizophrenia, unspecified type (HCC)  MDD (major depressive disorder), recurrent episode, moderate (HCC)  Hypothyroidism, unspecified type    Total Time spent with patient: 20 minutes  Past Psychiatric History: as documented in H&P Past Medical History: as documented in H&P Family History: as documented in H&P Family Psychiatric  History: as documented in H&P Social History: as documented in H&P  Additional Social History:     Sleep: Good  Appetite:  Good  Current Medications:  Current Facility-Administered Medications  Medication Dose Route Frequency Provider Last Rate Last Admin   acetaminophen (TYLENOL) tablet 650 mg  650 mg Oral Q6H PRN Marlou SaByungura, Veronique M, NP   650 mg at 11/04/22 2154   alum & mag  hydroxide-simeth (MAALOX/MYLANTA) 200-200-20 MG/5ML suspension 30 mL  30 mL Oral Q4H PRN Marlou SaByungura, Veronique M, NP   30 mL at 11/11/22 0052   ARIPiprazole ER (ABILIFY MAINTENA) 400 MG prefilled syringe 400 mg  400 mg Intramuscular Q28 days Princess BruinsNguyen, Julie, DO   400 mg at 10/25/22 0920   cetaphil lotion   Topical PRN Princess BruinsNguyen, Julie, DO       fluticasone (FLONASE) 50 MCG/ACT nasal spray 1 spray  1 spray Each Nare QHS Lauree ChandlerLee, Jacqueline Eun, NP   1 spray at 11/15/22 2228   hydrocortisone cream 1 %   Topical QID PRN Onuoha, Chinwendu V, NP   Given at 11/14/22 [id number]   hydrOXYzine (ATARAX) tablet 25 mg  25 mg Oral TID PRN Lorri Frederickarrion-Carrero, Margely, MD   25 mg at 11/14/22 2201   ketoconazole (NIZORAL) 2 % cream   Topical BID Princess BruinsNguyen, Julie, DO   Given at 11/16/22 [id number]   levothyroxine (SYNTHROID) tablet 100 mcg  100 mcg Oral Once Byungura, Veronique M, NP       levothyroxine (SYNTHROID) tablet 100 mcg  100 mcg Oral Q0600 Marlou SaByungura, Veronique M, NP   100 mcg at 11/16/22 0611   lidocaine (LIDODERM) 5 % 1 patch  1 patch Transdermal Q24H Princess BruinsNguyen, Julie, DO   1 patch at 11/16/22 0919   loratadine (CLARITIN) tablet 10 mg  10 mg Oral Daily Carrion-Carrero, Karle StarchMargely, MD   10 mg at 11/16/22 0918   magnesium hydroxide (MILK OF MAGNESIA) suspension 30 mL  30 mL Oral Daily PRN Marlou SaByungura, Veronique M, NP   30 mL at 10/12/22 2145  metFORMIN (GLUCOPHAGE) tablet 500 mg  500 mg Oral Q breakfast Carrion-Carrero, Margely, MD   500 mg at 11/16/22 6712   nicotine (NICODERM CQ - dosed in mg/24 hours) patch 14 mg  14 mg Transdermal Daily PRN Carrion-Carrero, Caryl Ada, MD       ondansetron (ZOFRAN-ODT) disintegrating tablet 4 mg  4 mg Oral Q8H PRN Carrion-Carrero, Margely, MD   4 mg at 11/01/22 2246   Oxcarbazepine (TRILEPTAL) tablet 300 mg  300 mg Oral BID Maple Hudson, Veronique M, NP   300 mg at 11/16/22 0918   pantoprazole (PROTONIX) EC tablet 40 mg  40 mg Oral Daily Carrion-Carrero, Caryl Ada, MD   40 mg at 11/16/22 0918   polyethylene glycol  (MIRALAX / GLYCOLAX) packet 17 g  17 g Oral Daily Merrily Brittle, DO   17 g at 11/16/22 0919   QUEtiapine (SEROQUEL) tablet 400 mg  400 mg Oral BID Maple Hudson, Veronique M, NP   400 mg at 11/16/22 4580   sertraline (ZOLOFT) tablet 150 mg  150 mg Oral Daily Carrion-Carrero, Margely, MD   150 mg at 11/16/22 0918   sodium chloride (OCEAN) 0.65 % nasal spray 1 spray  1 spray Each Nare Daily Merrily Brittle, DO   1 spray at 11/16/22 0919   traZODone (DESYREL) tablet 100 mg  100 mg Oral QHS Ronelle Nigh M, NP   100 mg at 11/15/22 2227   traZODone (DESYREL) tablet 50 mg  50 mg Oral QHS PRN Haynes Kerns, NP   50 mg at 11/15/22 2229   valACYclovir (VALTREX) tablet 500 mg  500 mg Oral Daily Carrion-Carrero, Caryl Ada, MD   500 mg at 11/16/22 0919   zinc oxide (BALMEX) 11.3 % cream   Topical PRN Merrily Brittle, DO       Current Outpatient Medications  Medication Sig Dispense Refill   ABILIFY MAINTENA 400 MG PRSY prefilled syringe 400 mg every 28 (twenty-eight) days.     cetirizine (ZYRTEC) 10 MG tablet Take 10 mg by mouth daily.     cyclobenzaprine (FLEXERIL) 10 MG tablet Take 1 tablet (10 mg total) by mouth 2 (two) times daily as needed for muscle spasms. 20 tablet 0   fluticasone (FLONASE) 50 MCG/ACT nasal spray Place 1 spray into both nostrils daily.     meloxicam (MOBIC) 15 MG tablet Take 15 mg by mouth daily.     nitrofurantoin, macrocrystal-monohydrate, (MACROBID) 100 MG capsule Take 1 capsule (100 mg total) by mouth 2 (two) times daily. 10 capsule 0   ondansetron (ZOFRAN-ODT) 4 MG disintegrating tablet Take 1 tablet (4 mg total) by mouth every 8 (eight) hours as needed for nausea or vomiting. 20 tablet 0   Oxcarbazepine (TRILEPTAL) 300 MG tablet Take 1 tablet (300 mg total) by mouth 2 (two) times daily. 60 tablet 0   QUEtiapine (SEROQUEL) 400 MG tablet Take 1 tablet (400 mg total) by mouth 2 (two) times daily. 60 tablet 0   sertraline (ZOLOFT) 50 MG tablet Take 3 tablets (150 mg total) by  mouth in the morning. 90 tablet 0   traZODone (DESYREL) 100 MG tablet Take 1 tablet (100 mg total) by mouth at bedtime. 30 tablet 0   valACYclovir (VALTREX) 500 MG tablet Take 500 mg by mouth daily.      Labs  Lab Results:  Admission on 10/07/2022  Component Date Value Ref Range Status   Hgb A1c MFr Bld 10/15/2022 5.7 (H)  4.8 - 5.6 % Final   Comment: (NOTE)  Prediabetes: 5.7 - 6.4         Diabetes: >6.4         Glycemic control for adults with diabetes: <7.0    Mean Plasma Glucose 10/15/2022 117  mg/dL Final   Comment: (NOTE) Performed At: [facility] [address] [place], [place] [id number] Jolene Schimke MD IR:[id number]    Color, Urine 11/13/2022 YELLOW  YELLOW Final   APPearance 11/13/2022 CLOUDY (A)  CLEAR Final   Specific Gravity, Urine 11/13/2022 1.023  1.005 - 1.030 Final   pH 11/13/2022 6.0  5.0 - 8.0 Final   Glucose, UA 11/13/2022 NEGATIVE  NEGATIVE mg/dL Final   Hgb urine dipstick 11/13/2022 NEGATIVE  NEGATIVE Final   Bilirubin Urine 11/13/2022 NEGATIVE  NEGATIVE Final   Ketones, ur 11/13/2022 NEGATIVE  NEGATIVE mg/dL Final   Protein, ur 76/19/5093 NEGATIVE  NEGATIVE mg/dL Final   Nitrite 26/71/2458 NEGATIVE  NEGATIVE Final   Leukocytes,Ua 11/13/2022 NEGATIVE  NEGATIVE Final   Performed at [facility], [address] [place], [place] [address]  Office Visit on 10/21/2022  Component Date Value Ref Range Status   SARSCOV2ONAVIRUS 2 AG 11/01/2022 NEGATIVE  NEGATIVE Final   Comment: (NOTE) SARS-CoV-2 antigen NOT DETECTED.   Negative results are presumptive.  Negative results do not preclude SARS-CoV-2 infection and should not be used as the sole basis for treatment or other patient management decisions, including infection  control decisions, particularly in the presence of clinical signs and  symptoms consistent with COVID-19, or in those who have been in contact with the virus.  Negative results must be combined with clinical  observations, patient history, and epidemiological information. The expected result is Negative.  Fact Sheet for Patients: https://www.jennings-kim.com/  Fact Sheet for Healthcare Providers: https://alexander-rogers.biz/  This test is not yet approved or cleared by the [place] FDA and  has been authorized for detection and/or diagnosis of SARS-CoV-2 by FDA under an Emergency Use Authorization (EUA).  This EUA will remain in effect (meaning this test can be used) for the duration of  the COV                          ID-19 declaration under Section 564(b)(1) of the Act, 21 U.S.C. section 360bbb-3(b)(1), unless the authorization is terminated or revoked sooner.    Admission on 10/07/2022, Discharged on 10/07/2022  Component Date Value Ref Range Status   Color, Urine 10/07/2022 YELLOW  YELLOW Final   APPearance 10/07/2022 HAZY (A)  CLEAR Final   Specific Gravity, Urine 10/07/2022 1.011  1.005 - 1.030 Final   pH 10/07/2022 7.0  5.0 - 8.0 Final   Glucose, UA 10/07/2022 NEGATIVE  NEGATIVE mg/dL Final   Hgb urine dipstick 10/07/2022 SMALL (A)  NEGATIVE Final   Bilirubin Urine 10/07/2022 NEGATIVE  NEGATIVE Final   Ketones, ur 10/07/2022 NEGATIVE  NEGATIVE mg/dL Final   Protein, ur 38/25/0539 NEGATIVE  NEGATIVE mg/dL Final   Nitrite 76/73/4193 NEGATIVE  NEGATIVE Final   Leukocytes,Ua 10/07/2022 LARGE (A)  NEGATIVE Final   RBC / HPF 10/07/2022 0-5  0 - 5 RBC/hpf Final   WBC, UA 10/07/2022 0-5  0 - 5 WBC/hpf Final   Bacteria, UA 10/07/2022 FEW (A)  NONE SEEN Final   Squamous Epithelial / HPF 10/07/2022 11-20  0 - 5 Final   Performed at [facility], [address] [place], [place] [address]   I-stat hCG, quantitative 10/07/2022 <5.0  <5 mIU/mL Final  Comment 3 10/07/2022          Final   Comment:   GEST. AGE      CONC.  (mIU/mL)   <=1 WEEK        5 - 50     2 WEEKS       50 - 500     3 WEEKS       100 - 10,000     4 WEEKS     1,000 - 30,000         FEMALE AND NON-PREGNANT FEMALE:     LESS THAN 5 mIU/mL    Sodium 10/07/2022 138  135 - 145 mmol/L Final   Potassium 10/07/2022 4.3  3.5 - 5.1 mmol/L Final   Chloride 10/07/2022 104  98 - 111 mmol/L Final   CO2 10/07/2022 28  22 - 32 mmol/L Final   Glucose, Bld 10/07/2022 90  70 - 99 mg/dL Final   Glucose reference range applies only to samples taken after fasting for at least 8 hours.   BUN 10/07/2022 8  6 - 20 mg/dL Final   Creatinine, Ser 10/07/2022 0.96  0.44 - 1.00 mg/dL Final   Calcium 10/07/2022 9.1  8.9 - 10.3 mg/dL Final   GFR, Estimated 10/07/2022 >60  >60 mL/min Final   Comment: (NOTE) Calculated using the CKD-EPI Creatinine Equation (2021)    Anion gap 10/07/2022 6  5 - 15 Final   Performed at [facility], [address], [place], [place] [address]   WBC 10/07/2022 5.8  4.0 - 10.5 K/uL Final   RBC 10/07/2022 4.36  3.87 - 5.11 MIL/uL Final   Hemoglobin 10/07/2022 12.9  12.0 - 15.0 g/dL Final   HCT 10/07/2022 40.0  36.0 - 46.0 % Final   MCV 10/07/2022 91.7  80.0 - 100.0 fL Final   MCH 10/07/2022 29.6  26.0 - 34.0 pg Final   MCHC 10/07/2022 32.3  30.0 - 36.0 g/dL Final   RDW 10/07/2022 12.4  11.5 - 15.5 % Final   Platelets 10/07/2022 295  150 - 400 K/uL Final   nRBC 10/07/2022 0.0  0.0 - 0.2 % Final   Performed at [facility] [address], [place], [place] [address]   SARS Coronavirus 2 by RT PCR 10/07/2022 NEGATIVE  NEGATIVE Final   Comment: (NOTE) SARS-CoV-2 target nucleic acids are NOT DETECTED.  The SARS-CoV-2 RNA is generally detectable in upper respiratory specimens during the acute phase of infection. The lowest concentration of SARS-CoV-2 viral copies this assay can detect is 138 copies/mL. A negative result does not preclude SARS-Cov-2 infection and should not be used as the sole basis for treatment or other patient management decisions. A negative result may occur with  improper specimen collection/handling, submission of specimen other than  nasopharyngeal swab, presence of viral mutation(s) within the areas targeted by this assay, and inadequate number of viral copies(<138 copies/mL). A negative result must be combined with clinical observations, patient history, and epidemiological information. The expected result is Negative.  Fact Sheet for Patients:  EntrepreneurPulse.com.au  Fact Sheet for Healthcare Providers:  IncredibleEmployment.be  This test is no                          t yet approved or cleared by the [place] FDA and  has been authorized for detection and/or diagnosis of SARS-CoV-2 by FDA under an Emergency Use Authorization (EUA). This EUA will remain  in  effect (meaning this test can be used) for the duration of the COVID-19 declaration under Section 564(b)(1) of the Act, 21 U.S.C.section 360bbb-3(b)(1), unless the authorization is terminated  or revoked sooner.       Influenza A by PCR 10/07/2022 NEGATIVE  NEGATIVE Final   Influenza B by PCR 10/07/2022 NEGATIVE  NEGATIVE Final   Comment: (NOTE) The Xpert Xpress SARS-CoV-2/FLU/RSV plus assay is intended as an aid in the diagnosis of influenza from Nasopharyngeal swab specimens and should not be used as a sole basis for treatment. Nasal washings and aspirates are unacceptable for Xpert Xpress SARS-CoV-2/FLU/RSV testing.  Fact Sheet for Patients: BloggerCourse.com  Fact Sheet for Healthcare Providers: SeriousBroker.it  This test is not yet approved or cleared by the [place] FDA and has been authorized for detection and/or diagnosis of SARS-CoV-2 by FDA under an Emergency Use Authorization (EUA). This EUA will remain in effect (meaning this test can be used) for the duration of the COVID-19 declaration under Section 564(b)(1) of the Act, 21 U.S.C. section 360bbb-3(b)(1), unless the authorization is terminated or revoked.     Resp Syncytial Virus by  PCR 10/07/2022 NEGATIVE  NEGATIVE Final   Comment: (NOTE) Fact Sheet for Patients: BloggerCourse.com  Fact Sheet for Healthcare Providers: SeriousBroker.it  This test is not yet approved or cleared by the [place] FDA and has been authorized for detection and/or diagnosis of SARS-CoV-2 by FDA under an Emergency Use Authorization (EUA). This EUA will remain in effect (meaning this test can be used) for the duration of the COVID-19 declaration under Section 564(b)(1) of the Act, 21 U.S.C. section 360bbb-3(b)(1), unless the authorization is terminated or revoked.  Performed at [facility], [address] [place], [place] [address]    Specimen Description 10/07/2022 URINE, CLEAN CATCH   Final   Special Requests 10/07/2022    Final                   Value:NONE Performed at [facility], [address], [place], [place] [address]    Culture 10/07/2022 MULTIPLE SPECIES PRESENT, SUGGEST RECOLLECTION (A)   Final   Report Status 10/07/2022 10/08/2022 FINAL   Final  Admission on 07/25/2022, Discharged on 10/07/2022  Component Date Value Ref Range Status   SARS Coronavirus 2 by RT PCR 07/25/2022 NEGATIVE  NEGATIVE Final   Comment: (NOTE) SARS-CoV-2 target nucleic acids are NOT DETECTED.  The SARS-CoV-2 RNA is generally detectable in upper respiratory specimens during the acute phase of infection. The lowest concentration of SARS-CoV-2 viral copies this assay can detect is 138 copies/mL. A negative result does not preclude SARS-Cov-2 infection and should not be used as the sole basis for treatment or other patient management decisions. A negative result may occur with  improper specimen collection/handling, submission of specimen other than nasopharyngeal swab, presence of viral mutation(s) within the areas targeted by this assay, and inadequate number of viral copies(<138 copies/mL). A negative result must be combined  with clinical observations, patient history, and epidemiological information. The expected result is Negative.  Fact Sheet for Patients:  BloggerCourse.com  Fact Sheet for Healthcare Providers:  SeriousBroker.it  This test is no                          t yet approved or cleared by the [place] FDA and  has been authorized for detection and/or diagnosis of SARS-CoV-2 by FDA under an Emergency Use Authorization (EUA). This EUA will  remain  in effect (meaning this test can be used) for the duration of the COVID-19 declaration under Section 564(b)(1) of the Act, 21 U.S.C.section 360bbb-3(b)(1), unless the authorization is terminated  or revoked sooner.       Influenza A by PCR 07/25/2022 NEGATIVE  NEGATIVE Final   Influenza B by PCR 07/25/2022 NEGATIVE  NEGATIVE Final   Comment: (NOTE) The Xpert Xpress SARS-CoV-2/FLU/RSV plus assay is intended as an aid in the diagnosis of influenza from Nasopharyngeal swab specimens and should not be used as a sole basis for treatment. Nasal washings and aspirates are unacceptable for Xpert Xpress SARS-CoV-2/FLU/RSV testing.  Fact Sheet for Patients: BloggerCourse.com  Fact Sheet for Healthcare Providers: SeriousBroker.it  This test is not yet approved or cleared by the [place] FDA and has been authorized for detection and/or diagnosis of SARS-CoV-2 by FDA under an Emergency Use Authorization (EUA). This EUA will remain in effect (meaning this test can be used) for the duration of the COVID-19 declaration under Section 564(b)(1) of the Act, 21 U.S.C. section 360bbb-3(b)(1), unless the authorization is terminated or revoked.  Performed at [facility], [address], [place], [place] [address]    WBC 07/25/2022 8.3  4.0 - 10.5 K/uL Final   RBC 07/25/2022 4.44  3.87 - 5.11 MIL/uL Final   Hemoglobin 07/25/2022 13.7  12.0 -  15.0 g/dL Final   HCT 60/45/4098 40.2  36.0 - 46.0 % Final   MCV 07/25/2022 90.5  80.0 - 100.0 fL Final   MCH 07/25/2022 30.9  26.0 - 34.0 pg Final   MCHC 07/25/2022 34.1  30.0 - 36.0 g/dL Final   RDW 11/91/4782 12.2  11.5 - 15.5 % Final   Platelets 07/25/2022 248  150 - 400 K/uL Final   nRBC 07/25/2022 0.0  0.0 - 0.2 % Final   Neutrophils Relative % 07/25/2022 43  % Final   Neutro Abs 07/25/2022 3.6  1.7 - 7.7 K/uL Final   Lymphocytes Relative 07/25/2022 52  % Final   Lymphs Abs 07/25/2022 4.2 (H)  0.7 - 4.0 K/uL Final   Monocytes Relative 07/25/2022 5  % Final   Monocytes Absolute 07/25/2022 0.4  0.1 - 1.0 K/uL Final   Eosinophils Relative 07/25/2022 0  % Final   Eosinophils Absolute 07/25/2022 0.0  0.0 - 0.5 K/uL Final   Basophils Relative 07/25/2022 0  % Final   Basophils Absolute 07/25/2022 0.0  0.0 - 0.1 K/uL Final   Immature Granulocytes 07/25/2022 0  % Final   Abs Immature Granulocytes 07/25/2022 0.02  0.00 - 0.07 K/uL Final   Performed at [facility], [address], [place], [place] [address]   Sodium 07/25/2022 138  135 - 145 mmol/L Final   Potassium 07/25/2022 4.0  3.5 - 5.1 mmol/L Final   Chloride 07/25/2022 104  98 - 111 mmol/L Final   CO2 07/25/2022 29  22 - 32 mmol/L Final   Glucose, Bld 07/25/2022 83  70 - 99 mg/dL Final   Glucose reference range applies only to samples taken after fasting for at least 8 hours.   BUN 07/25/2022 11  6 - 20 mg/dL Final   Creatinine, Ser 07/25/2022 0.97  0.44 - 1.00 mg/dL Final   Calcium 30/86/5784 9.2  8.9 - 10.3 mg/dL Final   Total Protein 69/62/9528 7.0  6.5 - 8.1 g/dL Final   Albumin 41/32/4401 3.8  3.5 - 5.0 g/dL Final   AST 02/72/5366 18  15 - 41 U/L Final  ALT 07/25/2022 22  0 - 44 U/L Final   Alkaline Phosphatase 07/25/2022 64  38 - 126 U/L Final   Total Bilirubin 07/25/2022 0.2 (L)  0.3 - 1.2 mg/dL Final   GFR, Estimated 07/25/2022 >60  >60 mL/min Final   Comment: (NOTE) Calculated using the CKD-EPI Creatinine  Equation (2021)    Anion gap 07/25/2022 5  5 - 15 Final   Performed at [facility], [address] [place], [place] [address]   POC Amphetamine UR 07/25/2022 None Detected  NONE DETECTED (Cut Off Level 1000 ng/mL) Preliminary   POC Secobarbital (BAR) 07/25/2022 None Detected  NONE DETECTED (Cut Off Level 300 ng/mL) Preliminary   POC Buprenorphine (BUP) 07/25/2022 None Detected  NONE DETECTED (Cut Off Level 10 ng/mL) Preliminary   POC Oxazepam (BZO) 07/25/2022 None Detected  NONE DETECTED (Cut Off Level 300 ng/mL) Preliminary   POC Cocaine UR 07/25/2022 None Detected  NONE DETECTED (Cut Off Level 300 ng/mL) Preliminary   POC Methamphetamine UR 07/25/2022 None Detected  NONE DETECTED (Cut Off Level 1000 ng/mL) Preliminary   POC Morphine 07/25/2022 None Detected  NONE DETECTED (Cut Off Level 300 ng/mL) Preliminary   POC Methadone UR 07/25/2022 None Detected  NONE DETECTED (Cut Off Level 300 ng/mL) Preliminary   POC Oxycodone UR 07/25/2022 None Detected  NONE DETECTED (Cut Off Level 100 ng/mL) Preliminary   POC Marijuana UR 07/25/2022 None Detected  NONE DETECTED (Cut Off Level 50 ng/mL) Preliminary   SARSCOV2ONAVIRUS 2 AG 07/25/2022 NEGATIVE  NEGATIVE Final   Comment: (NOTE) SARS-CoV-2 antigen NOT DETECTED.   Negative results are presumptive.  Negative results do not preclude SARS-CoV-2 infection and should not be used as the sole basis for treatment or other patient management decisions, including infection  control decisions, particularly in the presence of clinical signs and  symptoms consistent with COVID-19, or in those who have been in contact with the virus.  Negative results must be combined with clinical observations, patient history, and epidemiological information. The expected result is Negative.  Fact Sheet for Patients: https://www.jennings-kim.com/  Fact Sheet for Healthcare Providers: https://alexander-rogers.biz/  This test is not yet  approved or cleared by the [place] FDA and  has been authorized for detection and/or diagnosis of SARS-CoV-2 by FDA under an Emergency Use Authorization (EUA).  This EUA will remain in effect (meaning this test can be used) for the duration of  the COV                          ID-19 declaration under Section 564(b)(1) of the Act, 21 U.S.C. section 360bbb-3(b)(1), unless the authorization is terminated or revoked sooner.     Cholesterol 07/25/2022 181  0 - 200 mg/dL Final   Triglycerides 19/14/7829 118  <150 mg/dL Final   HDL 56/21/3086 45  >40 mg/dL Final   Total CHOL/HDL Ratio 07/25/2022 4.0  RATIO Final   VLDL 07/25/2022 24  0 - 40 mg/dL Final   LDL Cholesterol 07/25/2022 112 (H)  0 - 99 mg/dL Final   Comment:        Total Cholesterol/HDL:CHD Risk Coronary Heart Disease Risk Table                     Men   Women  1/2 Average Risk   3.4   3.3  Average Risk       5.0   4.4  2 X Average Risk   9.6  7.1  3 X Average Risk  23.4   11.0        Use the calculated Patient Ratio above and the CHD Risk Table to determine the patient's CHD Risk.        ATP III CLASSIFICATION (LDL):  <100     mg/dL   Optimal  [phone]  mg/dL   Near or Above                    Optimal  130-159  mg/dL   Borderline  [phone]  mg/dL   High  >956>190     mg/dL   Very High Performed at [facility], [address] [place], [place] [address]    TSH 07/25/2022 6.668 (H)  0.350 - 4.500 uIU/mL Final   Comment: Performed by a 3rd Generation assay with a functional sensitivity of <=0.01 uIU/mL. Performed at [facility], [address] [place], [place] [address]    Glucose-Capillary 07/26/2022 104 (H)  70 - 99 mg/dL Final   Glucose reference range applies only to samples taken after fasting for at least 8 hours.   T3, Free 07/31/2022 2.3  2.0 - 4.4 pg/mL Final   Comment: (NOTE) Performed At: [facility] [address] [place], [place] [id number] Jolene SchimkeNagendra Sanjai MD XL:[id number]:[phone]     Free T4 07/31/2022 0.60 (L)  0.61 - 1.12 ng/dL Final   Comment: (NOTE) Biotin ingestion may interfere with free T4 tests. If the results are inconsistent with the TSH level, previous test results, or the clinical presentation, then consider biotin interference. If needed, order repeat testing after stopping biotin. Performed at [facility], [address] [place], [place] [address]    Glucose-Capillary 08/29/2022 100 (H)  70 - 99 mg/dL Final   Glucose reference range applies only to samples taken after fasting for at least 8 hours.   TSH 09/05/2022 0.793  0.350 - 4.500 uIU/mL Final   Comment: Performed by a 3rd Generation assay with a functional sensitivity of <=0.01 uIU/mL. Performed at [facility], [address] [place], [place] [address]    Free T4 09/05/2022 0.73  0.61 - 1.12 ng/dL Final   Comment: (NOTE) Biotin ingestion may interfere with free T4 tests. If the results are inconsistent with the TSH level, previous test results, or the clinical presentation, then consider biotin interference. If needed, order repeat testing after stopping biotin. Performed at [facility], [address] [place], [place] [address]    Preg Test, Ur 09/06/2022 Negative  Negative Final   Preg Test, Ur 09/06/2022 NEGATIVE  NEGATIVE Final   Comment:        THE SENSITIVITY OF THIS METHODOLOGY IS >24 mIU/mL    Preg Test, Ur 09/05/2022 NEGATIVE  NEGATIVE Final   Comment:        THE SENSITIVITY OF THIS METHODOLOGY IS >24 mIU/mL    Sodium 09/13/2022 136  135 - 145 mmol/L Final   Potassium 09/13/2022 4.5  3.5 - 5.1 mmol/L Final   Chloride 09/13/2022 105  98 - 111 mmol/L Final   CO2 09/13/2022 21 (L)  22 - 32 mmol/L Final   Glucose, Bld 09/13/2022 111 (H)  70 - 99 mg/dL Final   Glucose reference range applies only to samples taken after fasting for at least 8 hours.   BUN 09/13/2022 14  6 - 20 mg/dL Final   Creatinine, Ser 09/13/2022 0.92  0.44 - 1.00 mg/dL Final   Calcium  09/13/2022 9.1  8.9 - 10.3 mg/dL Final   GFR, Estimated 09/13/2022 >60  >60 mL/min Final   Comment: (NOTE) Calculated using the CKD-EPI Creatinine Equation (2021)    Anion gap 09/13/2022 10  5 - 15 Final   Performed at [facility], [address], [place], [place] [address]   Vitamin B-12 09/13/2022 585  180 - 914 pg/mL Final   Comment: (NOTE) This assay is not validated for testing neonatal or myeloproliferative syndrome specimens for Vitamin B12 levels. Performed at [facility], [address], [place], [place] [address]    Color, Urine 09/14/2022 YELLOW  YELLOW Final   APPearance 09/14/2022 HAZY (A)  CLEAR Final   Specific Gravity, Urine 09/14/2022 1.025  1.005 - 1.030 Final   pH 09/14/2022 5.0  5.0 - 8.0 Final   Glucose, UA 09/14/2022 NEGATIVE  NEGATIVE mg/dL Final   Hgb urine dipstick 09/14/2022 NEGATIVE  NEGATIVE Final   Bilirubin Urine 09/14/2022 NEGATIVE  NEGATIVE Final   Ketones, ur 09/14/2022 NEGATIVE  NEGATIVE mg/dL Final   Protein, ur 20/35/5974 NEGATIVE  NEGATIVE mg/dL Final   Nitrite 16/38/4536 NEGATIVE  NEGATIVE Final   Leukocytes,Ua 09/14/2022 TRACE (A)  NEGATIVE Final   RBC / HPF 09/14/2022 0-5  0 - 5 RBC/hpf Final   WBC, UA 09/14/2022 0-5  0 - 5 WBC/hpf Final   Bacteria, UA 09/14/2022 RARE (A)  NONE SEEN Final   Squamous Epithelial / HPF 09/14/2022 0-5  0 - 5 Final   Mucus 09/14/2022 PRESENT   Final   Performed at [facility], [address], [place], [place] [address]   Glucose-Capillary 09/16/2022 105 (H)  70 - 99 mg/dL Final   Glucose reference range applies only to samples taken after fasting for at least 8 hours.   SARS Coronavirus 2 by RT PCR 09/30/2022 NEGATIVE  NEGATIVE Final   Comment: (NOTE) SARS-CoV-2 target nucleic acids are NOT DETECTED.  The SARS-CoV-2 RNA is generally detectable in upper respiratory specimens during the acute phase of infection. The lowest concentration of SARS-CoV-2 viral copies this assay can detect  is 138 copies/mL. A negative result does not preclude SARS-Cov-2 infection and should not be used as the sole basis for treatment or other patient management decisions. A negative result may occur with  improper specimen collection/handling, submission of specimen other than nasopharyngeal swab, presence of viral mutation(s) within the areas targeted by this assay, and inadequate number of viral copies(<138 copies/mL). A negative result must be combined with clinical observations, patient history, and epidemiological information. The expected result is Negative.  Fact Sheet for Patients:  BloggerCourse.com  Fact Sheet for Healthcare Providers:  SeriousBroker.it  This test is no                          t yet approved or cleared by the [place] FDA and  has been authorized for detection and/or diagnosis of SARS-CoV-2 by FDA under an Emergency Use Authorization (EUA). This EUA will remain  in effect (meaning this test can be used) for the duration of the COVID-19 declaration under Section 564(b)(1) of the Act, 21 U.S.C.section 360bbb-3(b)(1), unless the authorization is terminated  or revoked sooner.       Influenza A by PCR 09/30/2022 NEGATIVE  NEGATIVE Final   Influenza B by PCR 09/30/2022 NEGATIVE  NEGATIVE Final   Comment: (NOTE) The Xpert Xpress SARS-CoV-2/FLU/RSV plus assay is intended as an aid in the diagnosis of  influenza from Nasopharyngeal swab specimens and should not be used as a sole basis for treatment. Nasal washings and aspirates are unacceptable for Xpert Xpress SARS-CoV-2/FLU/RSV testing.  Fact Sheet for Patients: BloggerCourse.com  Fact Sheet for Healthcare Providers: SeriousBroker.it  This test is not yet approved or cleared by the [place] FDA and has been authorized for detection and/or diagnosis of SARS-CoV-2 by FDA under an Emergency Use  Authorization (EUA). This EUA will remain in effect (meaning this test can be used) for the duration of the COVID-19 declaration under Section 564(b)(1) of the Act, 21 U.S.C. section 360bbb-3(b)(1), unless the authorization is terminated or revoked.     Resp Syncytial Virus by PCR 09/30/2022 NEGATIVE  NEGATIVE Final   Comment: (NOTE) Fact Sheet for Patients: BloggerCourse.com  Fact Sheet for Healthcare Providers: SeriousBroker.it  This test is not yet approved or cleared by the [place] FDA and has been authorized for detection and/or diagnosis of SARS-CoV-2 by FDA under an Emergency Use Authorization (EUA). This EUA will remain in effect (meaning this test can be used) for the duration of the COVID-19 declaration under Section 564(b)(1) of the Act, 21 U.S.C. section 360bbb-3(b)(1), unless the authorization is terminated or revoked.  Performed at [facility], [address], [place], [place] [address]    Sodium 10/01/2022 136  135 - 145 mmol/L Final   Potassium 10/01/2022 3.8  3.5 - 5.1 mmol/L Final   Chloride 10/01/2022 104  98 - 111 mmol/L Final   CO2 10/01/2022 27  22 - 32 mmol/L Final   Glucose, Bld 10/01/2022 98  70 - 99 mg/dL Final   Glucose reference range applies only to samples taken after fasting for at least 8 hours.   BUN 10/01/2022 12  6 - 20 mg/dL Final   Creatinine, Ser 10/01/2022 0.98  0.44 - 1.00 mg/dL Final   Calcium 19/14/7829 8.9  8.9 - 10.3 mg/dL Final   GFR, Estimated 10/01/2022 >60  >60 mL/min Final   Comment: (NOTE) Calculated using the CKD-EPI Creatinine Equation (2021)    Anion gap 10/01/2022 5  5 - 15 Final   Performed at [facility], [address], [place], [place] [address]   WBC 10/01/2022 5.1  4.0 - 10.5 K/uL Final   RBC 10/01/2022 4.31  3.87 - 5.11 MIL/uL Final   Hemoglobin 10/01/2022 12.7  12.0 - 15.0 g/dL Final   HCT 08/65/7846 38.8  36.0 - 46.0 % Final   MCV 10/01/2022  90.0  80.0 - 100.0 fL Final   MCH 10/01/2022 29.5  26.0 - 34.0 pg Final   MCHC 10/01/2022 32.7  30.0 - 36.0 g/dL Final   RDW 96/29/5284 12.4  11.5 - 15.5 % Final   Platelets 10/01/2022 300  150 - 400 K/uL Final   nRBC 10/01/2022 0.0  0.0 - 0.2 % Final   Performed at [facility], [address] [place], [place] [address]   Lipase 10/01/2022 29  11 - 51 U/L Final   Performed at [facility], [address] [place], [place] [address]   Color, Urine 10/05/2022 YELLOW  YELLOW Final   APPearance 10/05/2022 HAZY (A)  CLEAR Final   Specific Gravity, Urine 10/05/2022 1.018  1.005 - 1.030 Final   pH 10/05/2022 5.0  5.0 - 8.0 Final   Glucose, UA 10/05/2022 NEGATIVE  NEGATIVE mg/dL Final   Hgb urine dipstick 10/05/2022 NEGATIVE  NEGATIVE Final   Bilirubin Urine 10/05/2022 NEGATIVE  NEGATIVE Final  Ketones, ur 10/05/2022 NEGATIVE  NEGATIVE mg/dL Final   Protein, ur 53/66/4403 NEGATIVE  NEGATIVE mg/dL Final   Nitrite 47/42/5956 NEGATIVE  NEGATIVE Final   Leukocytes,Ua 10/05/2022 TRACE (A)  NEGATIVE Final   RBC / HPF 10/05/2022 0-5  0 - 5 RBC/hpf Final   WBC, UA 10/05/2022 0-5  0 - 5 WBC/hpf Final   Bacteria, UA 10/05/2022 RARE (A)  NONE SEEN Final   Squamous Epithelial / HPF 10/05/2022 0-5  0 - 5 Final   Mucus 10/05/2022 PRESENT   Final   Performed at [facility], [address], [place], [place] [address]   Specimen Description 10/05/2022 URINE, CLEAN CATCH   Final   Special Requests 10/05/2022    Final                   Value:NONE Performed at [facility], [address], [place], [place] [address]    Culture 10/05/2022 10,000 COLONIES/mL MULTIPLE SPECIES PRESENT, SUGGEST RECOLLECTION (A)   Final   Report Status 10/05/2022 10/07/2022 FINAL   Final  Admission on 07/04/2022, Discharged on 07/05/2022  Component Date Value Ref Range Status   Sodium 07/04/2022 142  135 - 145 mmol/L Final   Potassium 07/04/2022 4.4  3.5 - 5.1 mmol/L Final   Chloride 07/04/2022 109  98  - 111 mmol/L Final   CO2 07/04/2022 26  22 - 32 mmol/L Final   Glucose, Bld 07/04/2022 96  70 - 99 mg/dL Final   Glucose reference range applies only to samples taken after fasting for at least 8 hours.   BUN 07/04/2022 15  6 - 20 mg/dL Final   Creatinine, Ser 07/04/2022 0.83  0.44 - 1.00 mg/dL Final   Calcium 51/88/4166 9.3  8.9 - 10.3 mg/dL Final   Total Protein 04/12/1600 7.2  6.5 - 8.1 g/dL Final   Albumin 09/32/3557 3.9  3.5 - 5.0 g/dL Final   AST 32/20/2542 23  15 - 41 U/L Final   ALT 07/04/2022 28  0 - 44 U/L Final   Alkaline Phosphatase 07/04/2022 77  38 - 126 U/L Final   Total Bilirubin 07/04/2022 0.4  0.3 - 1.2 mg/dL Final   GFR, Estimated 07/04/2022 >60  >60 mL/min Final   Comment: (NOTE) Calculated using the CKD-EPI Creatinine Equation (2021)    Anion gap 07/04/2022 7  5 - 15 Final   Performed at [facility], [address], [place], [place] [address]   Alcohol, Ethyl (B) 07/04/2022 <10  <10 mg/dL Final   Comment: (NOTE) Lowest detectable limit for serum alcohol is 10 mg/dL.  For medical purposes only. Performed at [facility], [address] [place], [place] [address]    WBC 07/04/2022 7.0  4.0 - 10.5 K/uL Final   RBC 07/04/2022 4.18  3.87 - 5.11 MIL/uL Final   Hemoglobin 07/04/2022 12.8  12.0 - 15.0 g/dL Final   HCT 15/17/6160 39.1  36.0 - 46.0 % Final   MCV 07/04/2022 93.5  80.0 - 100.0 fL Final   MCH 07/04/2022 30.6  26.0 - 34.0 pg Final   MCHC 07/04/2022 32.7  30.0 - 36.0 g/dL Final   RDW 73/71/0626 12.9  11.5 - 15.5 % Final   Platelets 07/04/2022 243  150 - 400 K/uL Final   nRBC 07/04/2022 0.0  0.0 - 0.2 % Final   Neutrophils Relative % 07/04/2022 43  % Final   Neutro Abs 07/04/2022 3.0  1.7 - 7.7 K/uL Final  Lymphocytes Relative 07/04/2022 50  % Final   Lymphs Abs 07/04/2022 3.5  0.7 - 4.0 K/uL Final   Monocytes Relative 07/04/2022 7  % Final   Monocytes Absolute 07/04/2022 0.5  0.1 - 1.0 K/uL Final   Eosinophils  Relative 07/04/2022 0  % Final   Eosinophils Absolute 07/04/2022 0.0  0.0 - 0.5 K/uL Final   Basophils Relative 07/04/2022 0  % Final   Basophils Absolute 07/04/2022 0.0  0.0 - 0.1 K/uL Final   Immature Granulocytes 07/04/2022 0  % Final   Abs Immature Granulocytes 07/04/2022 0.01  0.00 - 0.07 K/uL Final   Performed at [facility], [address] [place], [place] [address]   I-stat hCG, quantitative 07/04/2022 <5.0  <5 mIU/mL Final   Comment 3 07/04/2022          Final   Comment:   GEST. AGE      CONC.  (mIU/mL)   <=1 WEEK        5 - 50     2 WEEKS       50 - 500     3 WEEKS       100 - 10,000     4 WEEKS     1,000 - 30,000        FEMALE AND NON-PREGNANT FEMALE:     LESS THAN 5 mIU/mL   Admission on 06/14/2022, Discharged on 06/14/2022  Component Date Value Ref Range Status   Color, UA 06/14/2022 yellow  yellow Final   Clarity, UA 06/14/2022 cloudy (A)  clear Final   Glucose, UA 06/14/2022 negative  negative mg/dL Final   Bilirubin, UA 60/45/4098 negative  negative Final   Ketones, POC UA 06/14/2022 negative  negative mg/dL Final   Spec Grav, UA 11/91/4782 1.025  1.010 - 1.025 Final   Blood, UA 06/14/2022 negative  negative Final   pH, UA 06/14/2022 7.5  5.0 - 8.0 Final   Protein Ur, POC 06/14/2022 =30 (A)  negative mg/dL Final   Urobilinogen, UA 06/14/2022 0.2  0.2 or 1.0 E.U./dL Final   Nitrite, UA 95/62/1308 Negative  Negative Final   Leukocytes, UA 06/14/2022 Negative  Negative Final   Preg Test, Ur 06/14/2022 Negative  Negative Final   Specimen Description 06/14/2022 URINE, CLEAN CATCH   Final   Special Requests 06/14/2022    Final                   Value:NONE Performed at [facility], [address] [place], [place] [address]    Culture 06/14/2022 MULTIPLE SPECIES PRESENT, SUGGEST RECOLLECTION (A)   Final   Report Status 06/14/2022 06/16/2022 FINAL   Final  Admission on 06/07/2022, Discharged on 06/10/2022  Component Date Value Ref Range  Status   SARS Coronavirus 2 by RT PCR 06/07/2022 NEGATIVE  NEGATIVE Final   Comment: (NOTE) SARS-CoV-2 target nucleic acids are NOT DETECTED.  The SARS-CoV-2 RNA is generally detectable in upper respiratory specimens during the acute phase of infection. The lowest concentration of SARS-CoV-2 viral copies this assay can detect is 138 copies/mL. A negative result does not preclude SARS-Cov-2 infection and should not be used as the sole basis for treatment or other patient management decisions. A negative result may occur with  improper specimen collection/handling, submission of specimen other than nasopharyngeal swab, presence of viral mutation(s) within the areas targeted by this assay, and inadequate number of viral copies(<138 copies/mL). A negative result must be combined with clinical observations, patient  history, and epidemiological information. The expected result is Negative.  Fact Sheet for Patients:  BloggerCourse.com  Fact Sheet for Healthcare Providers:  SeriousBroker.it  This test is no                          t yet approved or cleared by the [place] FDA and  has been authorized for detection and/or diagnosis of SARS-CoV-2 by FDA under an Emergency Use Authorization (EUA). This EUA will remain  in effect (meaning this test can be used) for the duration of the COVID-19 declaration under Section 564(b)(1) of the Act, 21 U.S.C.section 360bbb-3(b)(1), unless the authorization is terminated  or revoked sooner.       Influenza A by PCR 06/07/2022 NEGATIVE  NEGATIVE Final   Influenza B by PCR 06/07/2022 NEGATIVE  NEGATIVE Final   Comment: (NOTE) The Xpert Xpress SARS-CoV-2/FLU/RSV plus assay is intended as an aid in the diagnosis of influenza from Nasopharyngeal swab specimens and should not be used as a sole basis for treatment. Nasal washings and aspirates are unacceptable for Xpert Xpress  SARS-CoV-2/FLU/RSV testing.  Fact Sheet for Patients: BloggerCourse.com  Fact Sheet for Healthcare Providers: SeriousBroker.it  This test is not yet approved or cleared by the [place] FDA and has been authorized for detection and/or diagnosis of SARS-CoV-2 by FDA under an Emergency Use Authorization (EUA). This EUA will remain in effect (meaning this test can be used) for the duration of the COVID-19 declaration under Section 564(b)(1) of the Act, 21 U.S.C. section 360bbb-3(b)(1), unless the authorization is terminated or revoked.  Performed at [facility], [address] [place], [place] [address]    WBC 06/07/2022 5.7  4.0 - 10.5 K/uL Final   RBC 06/07/2022 4.39  3.87 - 5.11 MIL/uL Final   Hemoglobin 06/07/2022 13.2  12.0 - 15.0 g/dL Final   HCT 57/32/2025 40.4  36.0 - 46.0 % Final   MCV 06/07/2022 92.0  80.0 - 100.0 fL Final   MCH 06/07/2022 30.1  26.0 - 34.0 pg Final   MCHC 06/07/2022 32.7  30.0 - 36.0 g/dL Final   RDW 42/70/6237 12.4  11.5 - 15.5 % Final   Platelets 06/07/2022 308  150 - 400 K/uL Final   nRBC 06/07/2022 0.0  0.0 - 0.2 % Final   Neutrophils Relative % 06/07/2022 42  % Final   Neutro Abs 06/07/2022 2.4  1.7 - 7.7 K/uL Final   Lymphocytes Relative 06/07/2022 54  % Final   Lymphs Abs 06/07/2022 3.1  0.7 - 4.0 K/uL Final   Monocytes Relative 06/07/2022 4  % Final   Monocytes Absolute 06/07/2022 0.2  0.1 - 1.0 K/uL Final   Eosinophils Relative 06/07/2022 0  % Final   Eosinophils Absolute 06/07/2022 0.0  0.0 - 0.5 K/uL Final   Basophils Relative 06/07/2022 0  % Final   Basophils Absolute 06/07/2022 0.0  0.0 - 0.1 K/uL Final   Immature Granulocytes 06/07/2022 0  % Final   Abs Immature Granulocytes 06/07/2022 0.01  0.00 - 0.07 K/uL Final   Performed at [facility], [address], [place], [place] [address]   Sodium 06/07/2022 139  135 - 145 mmol/L Final   Potassium 06/07/2022 4.0  3.5 - 5.1  mmol/L Final   Chloride 06/07/2022 104  98 - 111 mmol/L Final   CO2 06/07/2022 28  22 - 32 mmol/L Final   Glucose, Bld 06/07/2022 104 (H)  70 - 99 mg/dL  Final   Glucose reference range applies only to samples taken after fasting for at least 8 hours.   BUN 06/07/2022 8  6 - 20 mg/dL Final   Creatinine, Ser 06/07/2022 0.84  0.44 - 1.00 mg/dL Final   Calcium 40/98/119108/25/2023 9.1  8.9 - 10.3 mg/dL Final   Total Protein 47/82/956208/25/2023 6.9  6.5 - 8.1 g/dL Final   Albumin 13/08/657808/25/2023 3.7  3.5 - 5.0 g/dL Final   AST 46/96/295208/25/2023 19  15 - 41 U/L Final   ALT 06/07/2022 22  0 - 44 U/L Final   Alkaline Phosphatase 06/07/2022 54  38 - 126 U/L Final   Total Bilirubin 06/07/2022 0.4  0.3 - 1.2 mg/dL Final   GFR, Estimated 06/07/2022 >60  >60 mL/min Final   Comment: (NOTE) Calculated using the CKD-EPI Creatinine Equation (2021)    Anion gap 06/07/2022 7  5 - 15 Final   Performed at [facility], [address] [place], [place] [address]   Hgb A1c MFr Bld 06/07/2022 5.0  4.8 - 5.6 % Final   Comment: (NOTE) Pre diabetes:          5.7%-6.4%  Diabetes:              >6.4%  Glycemic control for   <7.0% adults with diabetes    Mean Plasma Glucose 06/07/2022 96.8  mg/dL Final   Performed at [facility], [address] [place], [place] [address]   TSH 06/07/2022 1.620  0.350 - 4.500 uIU/mL Final   Comment: Performed by a 3rd Generation assay with a functional sensitivity of <=0.01 uIU/mL. Performed at [facility], [address] [place], [place] [address]    RPR Ser Ql 06/07/2022 NON REACTIVE  NON REACTIVE Final   Performed at [facility], [address] [place], [place] [address]   Color, Urine 06/07/2022 YELLOW  YELLOW Final   APPearance 06/07/2022 HAZY (A)  CLEAR Final   Specific Gravity, Urine 06/07/2022 1.018  1.005 - 1.030 Final   pH 06/07/2022 7.0  5.0 - 8.0 Final   Glucose, UA 06/07/2022 NEGATIVE  NEGATIVE mg/dL Final   Hgb urine dipstick 06/07/2022 NEGATIVE  NEGATIVE  Final   Bilirubin Urine 06/07/2022 NEGATIVE  NEGATIVE Final   Ketones, ur 06/07/2022 NEGATIVE  NEGATIVE mg/dL Final   Protein, ur 34/74/259508/25/2023 NEGATIVE  NEGATIVE mg/dL Final   Nitrite 63/87/564308/25/2023 NEGATIVE  NEGATIVE Final   Leukocytes,Ua 06/07/2022 NEGATIVE  NEGATIVE Final   Performed at [facility], [address] [place], [place] [address]   Cholesterol 06/07/2022 164  0 - 200 mg/dL Final   Triglycerides 88/41/660608/25/2023 158 (H)  <150 mg/dL Final   HDL 30/16/010908/25/2023 44  >40 mg/dL Final   Total CHOL/HDL Ratio 06/07/2022 3.7  RATIO Final   VLDL 06/07/2022 32  0 - 40 mg/dL Final   LDL Cholesterol 06/07/2022 88  0 - 99 mg/dL Final   Comment:        Total Cholesterol/HDL:CHD Risk Coronary Heart Disease Risk Table                     Men   Women  1/2 Average Risk   3.4   3.3  Average Risk       5.0   4.4  2 X Average Risk   9.6   7.1  3 X Average Risk  23.4   11.0        Use the calculated Patient  Ratio above and the CHD Risk Table to determine the patient's CHD Risk.        ATP III CLASSIFICATION (LDL):  <100     mg/dL   Optimal  100-129  mg/dL   Near or Above                    Optimal  130-159  mg/dL   Borderline  160-189  mg/dL   High  >190     mg/dL   Very High Performed at [place] [address], [place], [place] [address]    HIV Screen 4th Generation wRfx 06/07/2022 Non Reactive  Non Reactive Final   Performed at [facility], [place] [address] [place], [place] [address]   SARSCOV2ONAVIRUS 2 AG 06/07/2022 NEGATIVE  NEGATIVE Final   Comment: (NOTE) SARS-CoV-2 antigen NOT DETECTED.   Negative results are presumptive.  Negative results do not preclude SARS-CoV-2 infection and should not be used as the sole basis for treatment or other patient management decisions, including infection  control decisions, particularly in the presence of clinical signs and  symptoms consistent with COVID-19, or in those who have been in contact with the virus.  Negative results  must be combined with clinical observations, patient history, and epidemiological information. The expected result is Negative.  Fact Sheet for Patients: HandmadeRecipes.com.cy  Fact Sheet for Healthcare Providers: FuneralLife.at  This test is not yet approved or cleared by the [place] FDA and  has been authorized for detection and/or diagnosis of SARS-CoV-2 by FDA under an Emergency Use Authorization (EUA).  This EUA will remain in effect (meaning this test can be used) for the duration of  the COV                          ID-19 declaration under Section 564(b)(1) of the Act, 21 U.S.C. section 360bbb-3(b)(1), unless the authorization is terminated or revoked sooner.     POC Amphetamine UR 06/07/2022 None Detected  NONE DETECTED (Cut Off Level 1000 ng/mL) Final   POC Secobarbital (BAR) 06/07/2022 None Detected  NONE DETECTED (Cut Off Level 300 ng/mL) Final   POC Buprenorphine (BUP) 06/07/2022 None Detected  NONE DETECTED (Cut Off Level 10 ng/mL) Final   POC Oxazepam (BZO) 06/07/2022 None Detected  NONE DETECTED (Cut Off Level 300 ng/mL) Final   POC Cocaine UR 06/07/2022 None Detected  NONE DETECTED (Cut Off Level 300 ng/mL) Final   POC Methamphetamine UR 06/07/2022 None Detected  NONE DETECTED (Cut Off Level 1000 ng/mL) Final   POC Morphine 06/07/2022 None Detected  NONE DETECTED (Cut Off Level 300 ng/mL) Final   POC Methadone UR 06/07/2022 None Detected  NONE DETECTED (Cut Off Level 300 ng/mL) Final   POC Oxycodone UR 06/07/2022 None Detected  NONE DETECTED (Cut Off Level 100 ng/mL) Final   POC Marijuana UR 06/07/2022 None Detected  NONE DETECTED (Cut Off Level 50 ng/mL) Final   Preg Test, Ur 06/08/2022 NEGATIVE  NEGATIVE Final   Comment:        THE SENSITIVITY OF THIS METHODOLOGY IS >20 mIU/mL. Performed at [facility], [place] [address], [place], [place] [address]    Preg Test, Ur 06/08/2022 NEGATIVE  NEGATIVE Final    Comment:        THE SENSITIVITY OF THIS METHODOLOGY IS >24 mIU/mL     Blood Alcohol level:  Lab Results  Component Value Date   ETH <10 07/04/2022  ETH <10 02/07/2022    Metabolic Disorder Labs: Lab Results  Component Value Date   HGBA1C 5.7 (H) 10/15/2022   MPG 117 10/15/2022   MPG 96.8 06/07/2022   No results found for: "PROLACTIN" Lab Results  Component Value Date   CHOL 181 07/25/2022   TRIG 118 07/25/2022   HDL 45 07/25/2022   CHOLHDL 4.0 07/25/2022   VLDL 24 07/25/2022   LDLCALC 112 (H) 07/25/2022   LDLCALC 88 06/07/2022    Therapeutic Lab Levels: No results found for: "LITHIUM" No results found for: "VALPROATE" No results found for: "CBMZ"  Physical Findings   GAD-7    Flowsheet Row Office Visit from 12/17/2021 in [facility] [place]  Total GAD-7 Score 17      PHQ2-9    Flowsheet Row ED from 10/07/2022 in [facility] ED from 06/07/2022 in [facility] Office Visit from 12/17/2021 in [facility] Total Score 0 1 2  PHQ-9 Total Score -- 2 8      Flowsheet Row ED from 10/07/2022 in [facility] Most recent reading at 11/07/2022  9:32 AM ED from 10/07/2022 in [facility] Emergency Department at [facility] Most recent reading at 10/07/2022 12:18 PM ED from 07/25/2022 in [facility] Most recent reading at 10/06/2022  9:38 AM  C-SSRS RISK CATEGORY No Risk No Risk No Risk        Musculoskeletal  Strength & Muscle Tone: within normal limits Gait & Station: normal Patient leans: N/A  Psychiatric Specialty Exam  Presentation  General Appearance:  Appropriate for Environment; Fairly Groomed  Eye Contact: Good  Speech: Clear and Coherent; Normal Rate  Speech Volume: Normal  Handedness: Right   Mood and Affect   Mood: Euthymic  Affect: Congruent   Thought Process  Thought Processes: Coherent  Descriptions of Associations:Intact  Orientation:Full (Time, Place and Person)  Thought Content:Logical  Diagnosis of Schizophrenia or Schizoaffective disorder in past: Yes  Duration of Psychotic Symptoms: Greater than six months   Hallucinations:Hallucinations: None  Ideas of Reference:None  Suicidal Thoughts:Suicidal Thoughts: No  Homicidal Thoughts:Homicidal Thoughts: No   Sensorium  Memory: Immediate Good; Recent Good; Remote Good  Judgment: Good  Insight: Good   Executive Functions  Concentration: Good  Attention Span: Good  Recall: Good  Fund of Knowledge: Good  Language: Good   Psychomotor Activity  Psychomotor Activity: Psychomotor Activity: Normal   Assets  Assets: Communication Skills; Desire for Improvement; Financial Resources/Insurance; Physical Health; Resilience; Social Support   Sleep  Sleep: Sleep: Good   No data recorded  Physical Exam  Physical Exam Vitals and nursing note reviewed.  Constitutional:      General: She is not in acute distress.    Appearance: Normal appearance. She is not ill-appearing.  HENT:     Head: Normocephalic.  Eyes:     General:        Right eye: No discharge.        Left eye: No discharge.     Conjunctiva/sclera: Conjunctivae normal.     Pupils: Pupils are equal, round, and reactive to light.  Cardiovascular:     Rate and Rhythm: Normal rate.  Pulmonary:     Effort: Pulmonary effort is normal. No respiratory distress.  Musculoskeletal:        General: Normal range of motion.     Cervical back: Normal range of  motion.  Skin:    General: Skin is warm and dry.     Coloration: Skin is not jaundiced or pale.  Neurological:     Mental Status: She is alert and oriented to person, place, and time.  Psychiatric:        Attention and Perception: Attention and perception normal.        Mood and Affect:  Mood and affect normal.        Speech: Speech normal.        Behavior: Behavior normal. Behavior is cooperative.        Thought Content: Thought content normal.        Cognition and Memory: Cognition normal.        Judgment: Judgment normal.    Review of Systems  Constitutional: Negative.   HENT: Negative.    Eyes: Negative.   Respiratory: Negative.    Cardiovascular: Negative.   Musculoskeletal: Negative.   Skin: Negative.   Neurological: Negative.   Psychiatric/Behavioral: Negative.     Blood pressure 105/73, pulse 100, temperature 98.4 F (36.9 C), temperature source Oral, resp. rate 18, height 5\' 1"  (1.549 m), weight 198 lb (89.8 kg), SpO2 100 %. Body mass index is 37.41 kg/m.  Treatment Plan Summary:  Plan Pt remains psychiatrically cleared. Disposition pending placement.   Will continue to have Daily contact with patient to assess and evaluate symptoms and progress in treatment and Medication management  Ardis Hughsarolyn H Aireana Ryland, NP 11/16/2022 10:48 AM

## 2022-11-17 DIAGNOSIS — F419 Anxiety disorder, unspecified: Secondary | ICD-10-CM | POA: Diagnosis not present

## 2022-11-17 DIAGNOSIS — N390 Urinary tract infection, site not specified: Secondary | ICD-10-CM | POA: Diagnosis not present

## 2022-11-17 DIAGNOSIS — E118 Type 2 diabetes mellitus with unspecified complications: Secondary | ICD-10-CM | POA: Diagnosis not present

## 2022-11-17 DIAGNOSIS — F333 Major depressive disorder, recurrent, severe with psychotic symptoms: Secondary | ICD-10-CM | POA: Diagnosis not present

## 2022-11-17 DIAGNOSIS — F17200 Nicotine dependence, unspecified, uncomplicated: Secondary | ICD-10-CM | POA: Diagnosis not present

## 2022-11-17 DIAGNOSIS — N9489 Other specified conditions associated with female genital organs and menstrual cycle: Secondary | ICD-10-CM | POA: Diagnosis not present

## 2022-11-17 DIAGNOSIS — F209 Schizophrenia, unspecified: Secondary | ICD-10-CM | POA: Diagnosis not present

## 2022-11-17 DIAGNOSIS — E039 Hypothyroidism, unspecified: Secondary | ICD-10-CM | POA: Diagnosis not present

## 2022-11-17 DIAGNOSIS — Z1152 Encounter for screening for COVID-19: Secondary | ICD-10-CM | POA: Diagnosis not present

## 2022-11-17 NOTE — ED Notes (Signed)
RN/AC took patient outside.

## 2022-11-17 NOTE — ED Provider Notes (Signed)
Behavioral Health Progress Note  Date and Time: 11/17/2022 4:29 PM Name: Nicole [name] MRN:  [id number]  HPI:   Nicole [name] is a 32 y/o female with PMH of SCZ unspecified, MDD with psychosis, who presented [facility] to [facility] (07/[facility]) via [facility] after a verbal altercation with Jeannett SeniorJosephine Okeke (not patient's legal guardian) for transient SI. This is patient's fourth visit to [facility] for similar concerns this year. Patient has been dismissed from previous group home due to threatening SI and HI, then leaving the group home. 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   Patient seen face-to-face by this provider and chart reviewed on 11/17/2022.  Subjective:    Patient observed sitting in her bed doing crafts, she is making paper boxes. She is pleasant upon approach and has a bright affect.  She continues to deny depression and has a euthymic mood.  She is denying SI/HI/AVH.  She is denying any paranoid or delusional thought content.  She does not appear to be responding to internal/external stimuli.  She remains calm and cooperative on the unit.  She expresses her excitement to take an IDD test on Monday, because this is the neck step daughter been able to live independently.  Per chart review "Per Merci, [facility] complete a psychological evaluation on Monday November 18, 2022 at 5:45pm virtually. The next Treatment Team meeting will be on 11-20-2022 (Wednesday) at 1pm"  Patient remains psychiatrically cleared. TOC/social worker/DSS continues to seek placement   Diagnosis:  Final diagnoses:  Tobacco use disorder  Schizophrenia, unspecified type (HCC)  MDD (major depressive disorder), recurrent episode, moderate (HCC)  Hypothyroidism, unspecified type    Total Time spent with patient: 20 minutes  Past Psychiatric History: as documented in H&P Past Medical History: as documented in H&P Family History: as documented in H&P Family Psychiatric   History: as documented in H&P Social History: as documented in H&P  Additional Social History: as documented in H&P                        Sleep: Good  Appetite:  Good  Current Medications:  Current Facility-Administered Medications  Medication Dose Route Frequency Provider Last Rate Last Admin   acetaminophen (TYLENOL) tablet 650 mg  650 mg Oral Q6H PRN Marlou SaByungura, Veronique M, NP   650 mg at 11/04/22 2154   alum & mag hydroxide-simeth (MAALOX/MYLANTA) 200-200-20 MG/5ML suspension 30 mL  30 mL Oral Q4H PRN Marlou SaByungura, Veronique M, NP   30 mL at 11/16/22 1849   ARIPiprazole ER (ABILIFY MAINTENA) 400 MG prefilled syringe 400 mg  400 mg Intramuscular Q28 days Princess BruinsNguyen, Julie, DO   400 mg at 10/25/22 0920   cetaphil lotion   Topical PRN Princess BruinsNguyen, Julie, DO       fluticasone (FLONASE) 50 MCG/ACT nasal spray 1 spray  1 spray Each Nare QHS Lauree ChandlerLee, Jacqueline Eun, NP   1 spray at 11/16/22 2225   hydrocortisone cream 1 %   Topical QID PRN Onuoha, Chinwendu V, NP   Given at 11/14/22 [id number]   hydrOXYzine (ATARAX) tablet 25 mg  25 mg Oral TID PRN Lorri Frederickarrion-Carrero, Margely, MD   25 mg at 11/14/22 2201   ketoconazole (NIZORAL) 2 % cream   Topical BID Princess BruinsNguyen, Julie, DO   Given at 11/16/22 [id number]   levothyroxine (SYNTHROID) tablet 100 mcg  100 mcg Oral Once Byungura, Veronique M, NP       levothyroxine (SYNTHROID) tablet 100 mcg  100 mcg Oral Q0600 Haynes Kerns, NP   100 mcg at 11/17/22 0844   lidocaine (LIDODERM) 5 % 1 patch  1 patch Transdermal Q24H Merrily Brittle, DO   1 patch at 11/17/22 1037   loratadine (CLARITIN) tablet 10 mg  10 mg Oral Daily Carrion-Carrero, Margely, MD   10 mg at 11/17/22 1034   magnesium hydroxide (MILK OF MAGNESIA) suspension 30 mL  30 mL Oral Daily PRN Haynes Kerns, NP   30 mL at 10/12/22 2145   metFORMIN (GLUCOPHAGE) tablet 500 mg  500 mg Oral Q breakfast Carrion-Carrero, Caryl Ada, MD   500 mg at 11/17/22 1034   nicotine (NICODERM CQ - dosed in mg/24 hours) patch  14 mg  14 mg Transdermal Daily PRN Carrion-Carrero, Caryl Ada, MD       ondansetron (ZOFRAN-ODT) disintegrating tablet 4 mg  4 mg Oral Q8H PRN Carrion-Carrero, Margely, MD   4 mg at 11/16/22 1849   Oxcarbazepine (TRILEPTAL) tablet 300 mg  300 mg Oral BID Byungura, Veronique M, NP   300 mg at 11/17/22 1033   pantoprazole (PROTONIX) EC tablet 40 mg  40 mg Oral Daily Carrion-Carrero, Margely, MD   40 mg at 11/17/22 1034   polyethylene glycol (MIRALAX / GLYCOLAX) packet 17 g  17 g Oral Daily Merrily Brittle, DO   17 g at 11/17/22 1032   QUEtiapine (SEROQUEL) tablet 400 mg  400 mg Oral BID Maple Hudson, Veronique M, NP   400 mg at 11/17/22 1033   sertraline (ZOLOFT) tablet 150 mg  150 mg Oral Daily Carrion-Carrero, Margely, MD   150 mg at 11/17/22 1033   sodium chloride (OCEAN) 0.65 % nasal spray 1 spray  1 spray Each Nare Daily Merrily Brittle, DO   1 spray at 11/17/22 1039   traZODone (DESYREL) tablet 100 mg  100 mg Oral QHS Ronelle Nigh M, NP   100 mg at 11/16/22 2223   traZODone (DESYREL) tablet 50 mg  50 mg Oral QHS PRN Haynes Kerns, NP   50 mg at 11/16/22 2223   valACYclovir (VALTREX) tablet 500 mg  500 mg Oral Daily Carrion-Carrero, Caryl Ada, MD   500 mg at 11/17/22 1034   zinc oxide (BALMEX) 11.3 % cream   Topical PRN Merrily Brittle, DO       Current Outpatient Medications  Medication Sig Dispense Refill   ABILIFY MAINTENA 400 MG PRSY prefilled syringe 400 mg every 28 (twenty-eight) days.     cetirizine (ZYRTEC) 10 MG tablet Take 10 mg by mouth daily.     cyclobenzaprine (FLEXERIL) 10 MG tablet Take 1 tablet (10 mg total) by mouth 2 (two) times daily as needed for muscle spasms. 20 tablet 0   fluticasone (FLONASE) 50 MCG/ACT nasal spray Place 1 spray into both nostrils daily.     meloxicam (MOBIC) 15 MG tablet Take 15 mg by mouth daily.     nitrofurantoin, macrocrystal-monohydrate, (MACROBID) 100 MG capsule Take 1 capsule (100 mg total) by mouth 2 (two) times daily. 10 capsule 0    ondansetron (ZOFRAN-ODT) 4 MG disintegrating tablet Take 1 tablet (4 mg total) by mouth every 8 (eight) hours as needed for nausea or vomiting. 20 tablet 0   Oxcarbazepine (TRILEPTAL) 300 MG tablet Take 1 tablet (300 mg total) by mouth 2 (two) times daily. 60 tablet 0   QUEtiapine (SEROQUEL) 400 MG tablet Take 1 tablet (400 mg total) by mouth 2 (two) times daily. 60 tablet 0   sertraline (ZOLOFT) 50 MG tablet Take 3 tablets (  150 mg total) by mouth in the morning. 90 tablet 0   traZODone (DESYREL) 100 MG tablet Take 1 tablet (100 mg total) by mouth at bedtime. 30 tablet 0   valACYclovir (VALTREX) 500 MG tablet Take 500 mg by mouth daily.      Labs  Lab Results:  Admission on 10/07/2022  Component Date Value Ref Range Status   Hgb A1c MFr Bld 10/15/2022 5.7 (H)  4.8 - 5.6 % Final   Comment: (NOTE)         Prediabetes: 5.7 - 6.4         Diabetes: >6.4         Glycemic control for adults with diabetes: <7.0    Mean Plasma Glucose 10/15/2022 117  mg/dL Final   Comment: (NOTE) Performed At: [facility] [address] [place], [place] [id number] Jolene SchimkeNagendra Sanjai MD ZD:[phone]    Color, Urine 11/13/2022 YELLOW  YELLOW Final   APPearance 11/13/2022 CLOUDY (A)  CLEAR Final   Specific Gravity, Urine 11/13/2022 1.023  1.005 - 1.030 Final   pH 11/13/2022 6.0  5.0 - 8.0 Final   Glucose, UA 11/13/2022 NEGATIVE  NEGATIVE mg/dL Final   Hgb urine dipstick 11/13/2022 NEGATIVE  NEGATIVE Final   Bilirubin Urine 11/13/2022 NEGATIVE  NEGATIVE Final   Ketones, ur 11/13/2022 NEGATIVE  NEGATIVE mg/dL Final   Protein, ur 95/18/841601/31/2024 NEGATIVE  NEGATIVE mg/dL Final   Nitrite 60/63/016001/31/2024 NEGATIVE  NEGATIVE Final   Leukocytes,Ua 11/13/2022 NEGATIVE  NEGATIVE Final   Performed at [facility], [address] [place], [place] [address]  Office Visit on 10/21/2022  Component Date Value Ref Range Status   SARSCOV2ONAVIRUS 2 AG 11/01/2022 NEGATIVE  NEGATIVE Final   Comment: (NOTE) SARS-CoV-2  antigen NOT DETECTED.   Negative results are presumptive.  Negative results do not preclude SARS-CoV-2 infection and should not be used as the sole basis for treatment or other patient management decisions, including infection  control decisions, particularly in the presence of clinical signs and  symptoms consistent with COVID-19, or in those who have been in contact with the virus.  Negative results must be combined with clinical observations, patient history, and epidemiological information. The expected result is Negative.  Fact Sheet for Patients: https://www.jennings-kim.com/https://www.fda.gov/media/141569/download  Fact Sheet for Healthcare Providers: https://alexander-rogers.biz/https://www.fda.gov/media/141568/download  This test is not yet approved or cleared by the [place] FDA and  has been authorized for detection and/or diagnosis of SARS-CoV-2 by FDA under an Emergency Use Authorization (EUA).  This EUA will remain in effect (meaning this test can be used) for the duration of  the COV                          ID-19 declaration under Section 564(b)(1) of the Act, 21 U.S.C. section 360bbb-3(b)(1), unless the authorization is terminated or revoked sooner.    Admission on 10/07/2022, Discharged on 10/07/2022  Component Date Value Ref Range Status   Color, Urine 10/07/2022 YELLOW  YELLOW Final   APPearance 10/07/2022 HAZY (A)  CLEAR Final   Specific Gravity, Urine 10/07/2022 1.011  1.005 - 1.030 Final   pH 10/07/2022 7.0  5.0 - 8.0 Final   Glucose, UA 10/07/2022 NEGATIVE  NEGATIVE mg/dL Final   Hgb urine dipstick 10/07/2022 SMALL (A)  NEGATIVE Final   Bilirubin Urine 10/07/2022 NEGATIVE  NEGATIVE Final   Ketones, ur 10/07/2022 NEGATIVE  NEGATIVE mg/dL Final   Protein, ur 35/57/322012/25/2023 NEGATIVE  NEGATIVE mg/dL Final   Nitrite  10/07/2022 NEGATIVE  NEGATIVE Final   Leukocytes,Ua 10/07/2022 LARGE (A)  NEGATIVE Final   RBC / HPF 10/07/2022 0-5  0 - 5 RBC/hpf Final   WBC, UA 10/07/2022 0-5  0 - 5 WBC/hpf Final   Bacteria, UA  10/07/2022 FEW (A)  NONE SEEN Final   Squamous Epithelial / HPF 10/07/2022 11-20  0 - 5 Final   Performed at [facility], [address] [place], [place] [address]   I-stat hCG, quantitative 10/07/2022 <5.0  <5 mIU/mL Final   Comment 3 10/07/2022          Final   Comment:   GEST. AGE      CONC.  (mIU/mL)   <=1 WEEK        5 - 50     2 WEEKS       50 - 500     3 WEEKS       100 - 10,000     4 WEEKS     1,000 - 30,000        FEMALE AND NON-PREGNANT FEMALE:     LESS THAN 5 mIU/mL    Sodium 10/07/2022 138  135 - 145 mmol/L Final   Potassium 10/07/2022 4.3  3.5 - 5.1 mmol/L Final   Chloride 10/07/2022 104  98 - 111 mmol/L Final   CO2 10/07/2022 28  22 - 32 mmol/L Final   Glucose, Bld 10/07/2022 90  70 - 99 mg/dL Final   Glucose reference range applies only to samples taken after fasting for at least 8 hours.   BUN 10/07/2022 8  6 - 20 mg/dL Final   Creatinine, Ser 10/07/2022 0.96  0.44 - 1.00 mg/dL Final   Calcium 60/45/4098 9.1  8.9 - 10.3 mg/dL Final   GFR, Estimated 10/07/2022 >60  >60 mL/min Final   Comment: (NOTE) Calculated using the CKD-EPI Creatinine Equation (2021)    Anion gap 10/07/2022 6  5 - 15 Final   Performed at [facility], [address] [place], [place] [address]   WBC 10/07/2022 5.8  4.0 - 10.5 K/uL Final   RBC 10/07/2022 4.36  3.87 - 5.11 MIL/uL Final   Hemoglobin 10/07/2022 12.9  12.0 - 15.0 g/dL Final   HCT 78/29/5621 40.0  36.0 - 46.0 % Final   MCV 10/07/2022 91.7  80.0 - 100.0 fL Final   MCH 10/07/2022 29.6  26.0 - 34.0 pg Final   MCHC 10/07/2022 32.3  30.0 - 36.0 g/dL Final   RDW 30/86/5784 12.4  11.5 - 15.5 % Final   Platelets 10/07/2022 295  150 - 400 K/uL Final   nRBC 10/07/2022 0.0  0.0 - 0.2 % Final   Performed at [facility], [address], [place], [place] [address]   SARS Coronavirus 2 by RT PCR 10/07/2022 NEGATIVE  NEGATIVE Final   Comment: (NOTE) SARS-CoV-2 target nucleic acids are NOT DETECTED.  The SARS-CoV-2 RNA is  generally detectable in upper respiratory specimens during the acute phase of infection. The lowest concentration of SARS-CoV-2 viral copies this assay can detect is 138 copies/mL. A negative result does not preclude SARS-Cov-2 infection and should not be used as the sole basis for treatment or other patient management decisions. A negative result may occur with  improper specimen collection/handling, submission of specimen other than nasopharyngeal swab, presence of viral mutation(s) within the areas targeted by this assay, and inadequate number of viral copies(<138 copies/mL). A negative result must be combined with clinical  observations, patient history, and epidemiological information. The expected result is Negative.  Fact Sheet for Patients:  BloggerCourse.comhttps://www.fda.gov/media/152166/download  Fact Sheet for Healthcare Providers:  SeriousBroker.ithttps://www.fda.gov/media/152162/download  This test is no                          t yet approved or cleared by the [place] FDA and  has been authorized for detection and/or diagnosis of SARS-CoV-2 by FDA under an Emergency Use Authorization (EUA). This EUA will remain  in effect (meaning this test can be used) for the duration of the COVID-19 declaration under Section 564(b)(1) of the Act, 21 U.S.C.section 360bbb-3(b)(1), unless the authorization is terminated  or revoked sooner.       Influenza A by PCR 10/07/2022 NEGATIVE  NEGATIVE Final   Influenza B by PCR 10/07/2022 NEGATIVE  NEGATIVE Final   Comment: (NOTE) The Xpert Xpress SARS-CoV-2/FLU/RSV plus assay is intended as an aid in the diagnosis of influenza from Nasopharyngeal swab specimens and should not be used as a sole basis for treatment. Nasal washings and aspirates are unacceptable for Xpert Xpress SARS-CoV-2/FLU/RSV testing.  Fact Sheet for Patients: BloggerCourse.comhttps://www.fda.gov/media/152166/download  Fact Sheet for Healthcare Providers: SeriousBroker.ithttps://www.fda.gov/media/152162/download  This  test is not yet approved or cleared by the [place] FDA and has been authorized for detection and/or diagnosis of SARS-CoV-2 by FDA under an Emergency Use Authorization (EUA). This EUA will remain in effect (meaning this test can be used) for the duration of the COVID-19 declaration under Section 564(b)(1) of the Act, 21 U.S.C. section 360bbb-3(b)(1), unless the authorization is terminated or revoked.     Resp Syncytial Virus by PCR 10/07/2022 NEGATIVE  NEGATIVE Final   Comment: (NOTE) Fact Sheet for Patients: BloggerCourse.comhttps://www.fda.gov/media/152166/download  Fact Sheet for Healthcare Providers: SeriousBroker.ithttps://www.fda.gov/media/152162/download  This test is not yet approved or cleared by the [place] FDA and has been authorized for detection and/or diagnosis of SARS-CoV-2 by FDA under an Emergency Use Authorization (EUA). This EUA will remain in effect (meaning this test can be used) for the duration of the COVID-19 declaration under Section 564(b)(1) of the Act, 21 U.S.C. section 360bbb-3(b)(1), unless the authorization is terminated or revoked.  Performed at [facility], [address] [place], [place] [address]    Specimen Description 10/07/2022 URINE, CLEAN CATCH   Final   Special Requests 10/07/2022    Final                   Value:NONE Performed at [facility], [address] [place], [place] [address]    Culture 10/07/2022 MULTIPLE SPECIES PRESENT, SUGGEST RECOLLECTION (A)   Final   Report Status 10/07/2022 10/08/2022 FINAL   Final  Admission on 07/25/2022, Discharged on 10/07/2022  Component Date Value Ref Range Status   SARS Coronavirus 2 by RT PCR 07/25/2022 NEGATIVE  NEGATIVE Final   Comment: (NOTE) SARS-CoV-2 target nucleic acids are NOT DETECTED.  The SARS-CoV-2 RNA is generally detectable in upper respiratory specimens during the acute phase of infection. The lowest concentration of SARS-CoV-2 viral copies this assay can detect is 138 copies/mL. A  negative result does not preclude SARS-Cov-2 infection and should not be used as the sole basis for treatment or other patient management decisions. A negative result may occur with  improper specimen collection/handling, submission of specimen other than nasopharyngeal swab, presence of viral mutation(s) within the areas targeted by this assay, and inadequate number of viral copies(<138 copies/mL). A negative result must be  combined with clinical observations, patient history, and epidemiological information. The expected result is Negative.  Fact Sheet for Patients:  BloggerCourse.com  Fact Sheet for Healthcare Providers:  SeriousBroker.it  This test is no                          t yet approved or cleared by the [place] FDA and  has been authorized for detection and/or diagnosis of SARS-CoV-2 by FDA under an Emergency Use Authorization (EUA). This EUA will remain  in effect (meaning this test can be used) for the duration of the COVID-19 declaration under Section 564(b)(1) of the Act, 21 U.S.C.section 360bbb-3(b)(1), unless the authorization is terminated  or revoked sooner.       Influenza A by PCR 07/25/2022 NEGATIVE  NEGATIVE Final   Influenza B by PCR 07/25/2022 NEGATIVE  NEGATIVE Final   Comment: (NOTE) The Xpert Xpress SARS-CoV-2/FLU/RSV plus assay is intended as an aid in the diagnosis of influenza from Nasopharyngeal swab specimens and should not be used as a sole basis for treatment. Nasal washings and aspirates are unacceptable for Xpert Xpress SARS-CoV-2/FLU/RSV testing.  Fact Sheet for Patients: BloggerCourse.com  Fact Sheet for Healthcare Providers: SeriousBroker.it  This test is not yet approved or cleared by the [place] FDA and has been authorized for detection and/or diagnosis of SARS-CoV-2 by FDA under an Emergency Use Authorization (EUA). This  EUA will remain in effect (meaning this test can be used) for the duration of the COVID-19 declaration under Section 564(b)(1) of the Act, 21 U.S.C. section 360bbb-3(b)(1), unless the authorization is terminated or revoked.  Performed at [facility], [address], [place], [place] [address]    WBC 07/25/2022 8.3  4.0 - 10.5 K/uL Final   RBC 07/25/2022 4.44  3.87 - 5.11 MIL/uL Final   Hemoglobin 07/25/2022 13.7  12.0 - 15.0 g/dL Final   HCT 91/47/8295 40.2  36.0 - 46.0 % Final   MCV 07/25/2022 90.5  80.0 - 100.0 fL Final   MCH 07/25/2022 30.9  26.0 - 34.0 pg Final   MCHC 07/25/2022 34.1  30.0 - 36.0 g/dL Final   RDW 62/13/0865 12.2  11.5 - 15.5 % Final   Platelets 07/25/2022 248  150 - 400 K/uL Final   nRBC 07/25/2022 0.0  0.0 - 0.2 % Final   Neutrophils Relative % 07/25/2022 43  % Final   Neutro Abs 07/25/2022 3.6  1.7 - 7.7 K/uL Final   Lymphocytes Relative 07/25/2022 52  % Final   Lymphs Abs 07/25/2022 4.2 (H)  0.7 - 4.0 K/uL Final   Monocytes Relative 07/25/2022 5  % Final   Monocytes Absolute 07/25/2022 0.4  0.1 - 1.0 K/uL Final   Eosinophils Relative 07/25/2022 0  % Final   Eosinophils Absolute 07/25/2022 0.0  0.0 - 0.5 K/uL Final   Basophils Relative 07/25/2022 0  % Final   Basophils Absolute 07/25/2022 0.0  0.0 - 0.1 K/uL Final   Immature Granulocytes 07/25/2022 0  % Final   Abs Immature Granulocytes 07/25/2022 0.02  0.00 - 0.07 K/uL Final   Performed at [facility], [address], [place], [place] [address]   Sodium 07/25/2022 138  135 - 145 mmol/L Final   Potassium 07/25/2022 4.0  3.5 - 5.1 mmol/L Final   Chloride 07/25/2022 104  98 - 111 mmol/L Final   CO2 07/25/2022 29  22 - 32 mmol/L Final   Glucose, Bld 07/25/2022 83  70 - 99 mg/dL Final   Glucose reference range applies only to samples taken after fasting for at least 8 hours.   BUN 07/25/2022 11  6 - 20 mg/dL Final   Creatinine, Ser 07/25/2022 0.97  0.44 - 1.00 mg/dL Final   Calcium 16/07/9603  9.2  8.9 - 10.3 mg/dL Final   Total Protein 54/06/8118 7.0  6.5 - 8.1 g/dL Final   Albumin 14/78/2956 3.8  3.5 - 5.0 g/dL Final   AST 21/30/8657 18  15 - 41 U/L Final   ALT 07/25/2022 22  0 - 44 U/L Final   Alkaline Phosphatase 07/25/2022 64  38 - 126 U/L Final   Total Bilirubin 07/25/2022 0.2 (L)  0.3 - 1.2 mg/dL Final   GFR, Estimated 07/25/2022 >60  >60 mL/min Final   Comment: (NOTE) Calculated using the CKD-EPI Creatinine Equation (2021)    Anion gap 07/25/2022 5  5 - 15 Final   Performed at [facility], [address], [place], [place] [address]   POC Amphetamine UR 07/25/2022 None Detected  NONE DETECTED (Cut Off Level 1000 ng/mL) Preliminary   POC Secobarbital (BAR) 07/25/2022 None Detected  NONE DETECTED (Cut Off Level 300 ng/mL) Preliminary   POC Buprenorphine (BUP) 07/25/2022 None Detected  NONE DETECTED (Cut Off Level 10 ng/mL) Preliminary   POC Oxazepam (BZO) 07/25/2022 None Detected  NONE DETECTED (Cut Off Level 300 ng/mL) Preliminary   POC Cocaine UR 07/25/2022 None Detected  NONE DETECTED (Cut Off Level 300 ng/mL) Preliminary   POC Methamphetamine UR 07/25/2022 None Detected  NONE DETECTED (Cut Off Level 1000 ng/mL) Preliminary   POC Morphine 07/25/2022 None Detected  NONE DETECTED (Cut Off Level 300 ng/mL) Preliminary   POC Methadone UR 07/25/2022 None Detected  NONE DETECTED (Cut Off Level 300 ng/mL) Preliminary   POC Oxycodone UR 07/25/2022 None Detected  NONE DETECTED (Cut Off Level 100 ng/mL) Preliminary   POC Marijuana UR 07/25/2022 None Detected  NONE DETECTED (Cut Off Level 50 ng/mL) Preliminary   SARSCOV2ONAVIRUS 2 AG 07/25/2022 NEGATIVE  NEGATIVE Final   Comment: (NOTE) SARS-CoV-2 antigen NOT DETECTED.   Negative results are presumptive.  Negative results do not preclude SARS-CoV-2 infection and should not be used as the sole basis for treatment or other patient management decisions, including infection  control decisions, particularly in the presence of  clinical signs and  symptoms consistent with COVID-19, or in those who have been in contact with the virus.  Negative results must be combined with clinical observations, patient history, and epidemiological information. The expected result is Negative.  Fact Sheet for Patients: https://www.jennings-kim.com/  Fact Sheet for Healthcare Providers: https://alexander-rogers.biz/  This test is not yet approved or cleared by the [place] FDA and  has been authorized for detection and/or diagnosis of SARS-CoV-2 by FDA under an Emergency Use Authorization (EUA).  This EUA will remain in effect (meaning this test can be used) for the duration of  the COV                          ID-19 declaration under Section 564(b)(1) of the Act, 21 U.S.C. section 360bbb-3(b)(1), unless the authorization is terminated or revoked sooner.     Cholesterol 07/25/2022 181  0 - 200 mg/dL Final   Triglycerides 29/52/8413 118  <150 mg/dL Final   HDL 24/40/1027 45  >40 mg/dL Final   Total CHOL/HDL Ratio 07/25/2022 4.0  RATIO Final   VLDL 07/25/2022 24  0 -  40 mg/dL Final   LDL Cholesterol 07/25/2022 112 (H)  0 - 99 mg/dL Final   Comment:        Total Cholesterol/HDL:CHD Risk Coronary Heart Disease Risk Table                     Men   Women  1/2 Average Risk   3.4   3.3  Average Risk       5.0   4.4  2 X Average Risk   9.6   7.1  3 X Average Risk  23.4   11.0        Use the calculated Patient Ratio above and the CHD Risk Table to determine the patient's CHD Risk.        ATP III CLASSIFICATION (LDL):  <100     mg/dL   Optimal  409-811  mg/dL   Near or Above                    Optimal  130-159  mg/dL   Borderline  914-782  mg/dL   High  >956     mg/dL   Very High Performed at [facility], [address] [place], [place] [address]    TSH 07/25/2022 6.668 (H)  0.350 - 4.500 uIU/mL Final   Comment: Performed by a 3rd Generation assay with a functional sensitivity of  <=0.01 uIU/mL. Performed at [facility], [address] [place], [place] [address]    Glucose-Capillary 07/26/2022 104 (H)  70 - 99 mg/dL Final   Glucose reference range applies only to samples taken after fasting for at least 8 hours.   T3, Free 07/31/2022 2.3  2.0 - 4.4 pg/mL Final   Comment: (NOTE) Performed At: [facility] [address] [place], [place] [id number] Jolene Schimke MD XL:[id number]    Free T4 07/31/2022 0.60 (L)  0.61 - 1.12 ng/dL Final   Comment: (NOTE) Biotin ingestion may interfere with free T4 tests. If the results are inconsistent with the TSH level, previous test results, or the clinical presentation, then consider biotin interference. If needed, order repeat testing after stopping biotin. Performed at [facility], [address], [place], [place] [address]    Glucose-Capillary 08/29/2022 100 (H)  70 - 99 mg/dL Final   Glucose reference range applies only to samples taken after fasting for at least 8 hours.   TSH 09/05/2022 0.793  0.350 - 4.500 uIU/mL Final   Comment: Performed by a 3rd Generation assay with a functional sensitivity of <=0.01 uIU/mL. Performed at [facility], [address], [place], [place] [address]    Free T4 09/05/2022 0.73  0.61 - 1.12 ng/dL Final   Comment: (NOTE) Biotin ingestion may interfere with free T4 tests. If the results are inconsistent with the TSH level, previous test results, or the clinical presentation, then consider biotin interference. If needed, order repeat testing after stopping biotin. Performed at [facility], [address], [place], [place] [address]    Preg Test, Ur 09/06/2022 Negative  Negative Final   Preg Test, Ur 09/06/2022 NEGATIVE  NEGATIVE Final   Comment:        THE SENSITIVITY OF THIS METHODOLOGY IS >24 mIU/mL    Preg Test, Ur 09/05/2022 NEGATIVE  NEGATIVE Final   Comment:        THE SENSITIVITY OF THIS METHODOLOGY IS >24 mIU/mL    Sodium 09/13/2022 136   135 - 145  mmol/L Final   Potassium 09/13/2022 4.5  3.5 - 5.1 mmol/L Final   Chloride 09/13/2022 105  98 - 111 mmol/L Final   CO2 09/13/2022 21 (L)  22 - 32 mmol/L Final   Glucose, Bld 09/13/2022 111 (H)  70 - 99 mg/dL Final   Glucose reference range applies only to samples taken after fasting for at least 8 hours.   BUN 09/13/2022 14  6 - 20 mg/dL Final   Creatinine, Ser 09/13/2022 0.92  0.44 - 1.00 mg/dL Final   Calcium 09/13/2022 9.1  8.9 - 10.3 mg/dL Final   GFR, Estimated 09/13/2022 >60  >60 mL/min Final   Comment: (NOTE) Calculated using the CKD-EPI Creatinine Equation (2021)    Anion gap 09/13/2022 10  5 - 15 Final   Performed at [facility], [place] [address] [place], [place] [address]   Vitamin B-12 09/13/2022 585  180 - 914 pg/mL Final   Comment: (NOTE) This assay is not validated for testing neonatal or myeloproliferative syndrome specimens for Vitamin B12 levels. Performed at [facility], [place] [address], [place], [place] [address]    Color, Urine 09/14/2022 YELLOW  YELLOW Final   APPearance 09/14/2022 HAZY (A)  CLEAR Final   Specific Gravity, Urine 09/14/2022 1.025  1.005 - 1.030 Final   pH 09/14/2022 5.0  5.0 - 8.0 Final   Glucose, UA 09/14/2022 NEGATIVE  NEGATIVE mg/dL Final   Hgb urine dipstick 09/14/2022 NEGATIVE  NEGATIVE Final   Bilirubin Urine 09/14/2022 NEGATIVE  NEGATIVE Final   Ketones, ur 09/14/2022 NEGATIVE  NEGATIVE mg/dL Final   Protein, ur 09/14/2022 NEGATIVE  NEGATIVE mg/dL Final   Nitrite 09/14/2022 NEGATIVE  NEGATIVE Final   Leukocytes,Ua 09/14/2022 TRACE (A)  NEGATIVE Final   RBC / HPF 09/14/2022 0-5  0 - 5 RBC/hpf Final   WBC, UA 09/14/2022 0-5  0 - 5 WBC/hpf Final   Bacteria, UA 09/14/2022 RARE (A)  NONE SEEN Final   Squamous Epithelial / HPF 09/14/2022 0-5  0 - 5 Final   Mucus 09/14/2022 PRESENT   Final   Performed at [facility], [place] [address] [place], [place] [address]   Glucose-Capillary 09/16/2022 105 (H)  70 - 99 mg/dL  Final   Glucose reference range applies only to samples taken after fasting for at least 8 hours.   SARS Coronavirus 2 by RT PCR 09/30/2022 NEGATIVE  NEGATIVE Final   Comment: (NOTE) SARS-CoV-2 target nucleic acids are NOT DETECTED.  The SARS-CoV-2 RNA is generally detectable in upper respiratory specimens during the acute phase of infection. The lowest concentration of SARS-CoV-2 viral copies this assay can detect is 138 copies/mL. A negative result does not preclude SARS-Cov-2 infection and should not be used as the sole basis for treatment or other patient management decisions. A negative result may occur with  improper specimen collection/handling, submission of specimen other than nasopharyngeal swab, presence of viral mutation(s) within the areas targeted by this assay, and inadequate number of viral copies(<138 copies/mL). A negative result must be combined with clinical observations, patient history, and epidemiological information. The expected result is Negative.  Fact Sheet for Patients:  EntrepreneurPulse.com.au  Fact Sheet for Healthcare Providers:  IncredibleEmployment.be  This test is no                          t yet approved or cleared by the [place] FDA and  has been authorized for detection and/or diagnosis of  SARS-CoV-2 by FDA under an Emergency Use Authorization (EUA). This EUA will remain  in effect (meaning this test can be used) for the duration of the COVID-19 declaration under Section 564(b)(1) of the Act, 21 U.S.C.section 360bbb-3(b)(1), unless the authorization is terminated  or revoked sooner.       Influenza A by PCR 09/30/2022 NEGATIVE  NEGATIVE Final   Influenza B by PCR 09/30/2022 NEGATIVE  NEGATIVE Final   Comment: (NOTE) The Xpert Xpress SARS-CoV-2/FLU/RSV plus assay is intended as an aid in the diagnosis of influenza from Nasopharyngeal swab specimens and should not be used as a sole basis for  treatment. Nasal washings and aspirates are unacceptable for Xpert Xpress SARS-CoV-2/FLU/RSV testing.  Fact Sheet for Patients: BloggerCourse.com  Fact Sheet for Healthcare Providers: SeriousBroker.it  This test is not yet approved or cleared by the [place] FDA and has been authorized for detection and/or diagnosis of SARS-CoV-2 by FDA under an Emergency Use Authorization (EUA). This EUA will remain in effect (meaning this test can be used) for the duration of the COVID-19 declaration under Section 564(b)(1) of the Act, 21 U.S.C. section 360bbb-3(b)(1), unless the authorization is terminated or revoked.     Resp Syncytial Virus by PCR 09/30/2022 NEGATIVE  NEGATIVE Final   Comment: (NOTE) Fact Sheet for Patients: BloggerCourse.com  Fact Sheet for Healthcare Providers: SeriousBroker.it  This test is not yet approved or cleared by the [place] FDA and has been authorized for detection and/or diagnosis of SARS-CoV-2 by FDA under an Emergency Use Authorization (EUA). This EUA will remain in effect (meaning this test can be used) for the duration of the COVID-19 declaration under Section 564(b)(1) of the Act, 21 U.S.C. section 360bbb-3(b)(1), unless the authorization is terminated or revoked.  Performed at [facility], [address] [place], [place] [address]    Sodium 10/01/2022 136  135 - 145 mmol/L Final   Potassium 10/01/2022 3.8  3.5 - 5.1 mmol/L Final   Chloride 10/01/2022 104  98 - 111 mmol/L Final   CO2 10/01/2022 27  22 - 32 mmol/L Final   Glucose, Bld 10/01/2022 98  70 - 99 mg/dL Final   Glucose reference range applies only to samples taken after fasting for at least 8 hours.   BUN 10/01/2022 12  6 - 20 mg/dL Final   Creatinine, Ser 10/01/2022 0.98  0.44 - 1.00 mg/dL Final   Calcium 85/88/5027 8.9  8.9 - 10.3 mg/dL Final   GFR, Estimated 10/01/2022  >60  >60 mL/min Final   Comment: (NOTE) Calculated using the CKD-EPI Creatinine Equation (2021)    Anion gap 10/01/2022 5  5 - 15 Final   Performed at [facility], [address], [place], [place] [address]   WBC 10/01/2022 5.1  4.0 - 10.5 K/uL Final   RBC 10/01/2022 4.31  3.87 - 5.11 MIL/uL Final   Hemoglobin 10/01/2022 12.7  12.0 - 15.0 g/dL Final   HCT 78/67/6720 38.8  36.0 - 46.0 % Final   MCV 10/01/2022 90.0  80.0 - 100.0 fL Final   MCH 10/01/2022 29.5  26.0 - 34.0 pg Final   MCHC 10/01/2022 32.7  30.0 - 36.0 g/dL Final   RDW 94/70/9628 12.4  11.5 - 15.5 % Final   Platelets 10/01/2022 300  150 - 400 K/uL Final   nRBC 10/01/2022 0.0  0.0 - 0.2 % Final   Performed at [facility], [address], [place], [place] [address]   Lipase 10/01/2022 29  11 - 51 U/L Final   Performed at [facility], [address], [place], [place] [address]   Color, Urine 10/05/2022 YELLOW  YELLOW Final   APPearance 10/05/2022 HAZY (A)  CLEAR Final   Specific Gravity, Urine 10/05/2022 1.018  1.005 - 1.030 Final   pH 10/05/2022 5.0  5.0 - 8.0 Final   Glucose, UA 10/05/2022 NEGATIVE  NEGATIVE mg/dL Final   Hgb urine dipstick 10/05/2022 NEGATIVE  NEGATIVE Final   Bilirubin Urine 10/05/2022 NEGATIVE  NEGATIVE Final   Ketones, ur 10/05/2022 NEGATIVE  NEGATIVE mg/dL Final   Protein, ur 25/02/3975 NEGATIVE  NEGATIVE mg/dL Final   Nitrite 73/41/9379 NEGATIVE  NEGATIVE Final   Leukocytes,Ua 10/05/2022 TRACE (A)  NEGATIVE Final   RBC / HPF 10/05/2022 0-5  0 - 5 RBC/hpf Final   WBC, UA 10/05/2022 0-5  0 - 5 WBC/hpf Final   Bacteria, UA 10/05/2022 RARE (A)  NONE SEEN Final   Squamous Epithelial / HPF 10/05/2022 0-5  0 - 5 Final   Mucus 10/05/2022 PRESENT   Final   Performed at [facility], [address] [place], [place] [address]   Specimen Description 10/05/2022 URINE, CLEAN CATCH   Final   Special Requests 10/05/2022    Final                   Value:NONE Performed at [facility], [address], [place], [place] [address]    Culture 10/05/2022 10,000 COLONIES/mL MULTIPLE SPECIES PRESENT, SUGGEST RECOLLECTION (A)   Final   Report Status 10/05/2022 10/07/2022 FINAL   Final  Admission on 07/04/2022, Discharged on 07/05/2022  Component Date Value Ref Range Status   Sodium 07/04/2022 142  135 - 145 mmol/L Final   Potassium 07/04/2022 4.4  3.5 - 5.1 mmol/L Final   Chloride 07/04/2022 109  98 - 111 mmol/L Final   CO2 07/04/2022 26  22 - 32 mmol/L Final   Glucose, Bld 07/04/2022 96  70 - 99 mg/dL Final   Glucose reference range applies only to samples taken after fasting for at least 8 hours.   BUN 07/04/2022 15  6 - 20 mg/dL Final   Creatinine, Ser 07/04/2022 0.83  0.44 - 1.00 mg/dL Final   Calcium 99/24/2683 9.3  8.9 - 10.3 mg/dL Final   Total Protein 41/96/2229 7.2  6.5 - 8.1 g/dL Final   Albumin 79/89/2119 3.9  3.5 - 5.0 g/dL Final   AST 41/74/0814 23  15 - 41 U/L Final   ALT 07/04/2022 28  0 - 44 U/L Final   Alkaline Phosphatase 07/04/2022 77  38 - 126 U/L Final   Total Bilirubin 07/04/2022 0.4  0.3 - 1.2 mg/dL Final   GFR, Estimated 07/04/2022 >60  >60 mL/min Final   Comment: (NOTE) Calculated using the CKD-EPI Creatinine Equation (2021)    Anion gap 07/04/2022 7  5 - 15 Final   Performed at [facility], [address], [place], [place] [address]   Alcohol, Ethyl (B) 07/04/2022 <10  <10 mg/dL Final   Comment: (NOTE) Lowest detectable limit for serum alcohol is 10 mg/dL.  For medical purposes only. Performed at [facility], [address] [place], [place] [address]    WBC 07/04/2022 7.0  4.0 - 10.5 K/uL Final   RBC 07/04/2022 4.18  3.87 - 5.11 MIL/uL Final   Hemoglobin 07/04/2022 12.8  12.0 - 15.0 g/dL Final   HCT 70/26/3785 39.1  36.0 - 46.0 %  Final   MCV 07/04/2022 93.5  80.0 - 100.0 fL Final   MCH 07/04/2022 30.6  26.0 - 34.0 pg Final   MCHC 07/04/2022 32.7  30.0 - 36.0 g/dL Final   RDW 16/07/9603  12.9  11.5 - 15.5 % Final   Platelets 07/04/2022 243  150 - 400 K/uL Final   nRBC 07/04/2022 0.0  0.0 - 0.2 % Final   Neutrophils Relative % 07/04/2022 43  % Final   Neutro Abs 07/04/2022 3.0  1.7 - 7.7 K/uL Final   Lymphocytes Relative 07/04/2022 50  % Final   Lymphs Abs 07/04/2022 3.5  0.7 - 4.0 K/uL Final   Monocytes Relative 07/04/2022 7  % Final   Monocytes Absolute 07/04/2022 0.5  0.1 - 1.0 K/uL Final   Eosinophils Relative 07/04/2022 0  % Final   Eosinophils Absolute 07/04/2022 0.0  0.0 - 0.5 K/uL Final   Basophils Relative 07/04/2022 0  % Final   Basophils Absolute 07/04/2022 0.0  0.0 - 0.1 K/uL Final   Immature Granulocytes 07/04/2022 0  % Final   Abs Immature Granulocytes 07/04/2022 0.01  0.00 - 0.07 K/uL Final   Performed at [facility], [address] [place], [place] [address]   I-stat hCG, quantitative 07/04/2022 <5.0  <5 mIU/mL Final   Comment 3 07/04/2022          Final   Comment:   GEST. AGE      CONC.  (mIU/mL)   <=1 WEEK        5 - 50     2 WEEKS       50 - 500     3 WEEKS       100 - 10,000     4 WEEKS     1,000 - 30,000        FEMALE AND NON-PREGNANT FEMALE:     LESS THAN 5 mIU/mL   Admission on 06/14/2022, Discharged on 06/14/2022  Component Date Value Ref Range Status   Color, UA 06/14/2022 yellow  yellow Final   Clarity, UA 06/14/2022 cloudy (A)  clear Final   Glucose, UA 06/14/2022 negative  negative mg/dL Final   Bilirubin, UA 11/91/4782 negative  negative Final   Ketones, POC UA 06/14/2022 negative  negative mg/dL Final   Spec Grav, UA 95/62/1308 1.025  1.010 - 1.025 Final   Blood, UA 06/14/2022 negative  negative Final   pH, UA 06/14/2022 7.5  5.0 - 8.0 Final   Protein Ur, POC 06/14/2022 =30 (A)  negative mg/dL Final   Urobilinogen, UA 06/14/2022 0.2  0.2 or 1.0 E.U./dL Final   Nitrite, UA 65/78/4696 Negative  Negative Final   Leukocytes, UA 06/14/2022 Negative  Negative Final   Preg Test, Ur 06/14/2022 Negative  Negative Final    Specimen Description 06/14/2022 URINE, CLEAN CATCH   Final   Special Requests 06/14/2022    Final                   Value:NONE Performed at [facility], [address], [place], [place] [address]    Culture 06/14/2022 MULTIPLE SPECIES PRESENT, SUGGEST RECOLLECTION (A)   Final   Report Status 06/14/2022 06/16/2022 FINAL   Final  Admission on 06/07/2022, Discharged on 06/10/2022  Component Date Value Ref Range Status   SARS Coronavirus 2 by RT PCR 06/07/2022 NEGATIVE  NEGATIVE Final   Comment: (NOTE) SARS-CoV-2 target nucleic acids are NOT DETECTED.  The SARS-CoV-2 RNA is generally detectable in upper  respiratory specimens during the acute phase of infection. The lowest concentration of SARS-CoV-2 viral copies this assay can detect is 138 copies/mL. A negative result does not preclude SARS-Cov-2 infection and should not be used as the sole basis for treatment or other patient management decisions. A negative result may occur with  improper specimen collection/handling, submission of specimen other than nasopharyngeal swab, presence of viral mutation(s) within the areas targeted by this assay, and inadequate number of viral copies(<138 copies/mL). A negative result must be combined with clinical observations, patient history, and epidemiological information. The expected result is Negative.  Fact Sheet for Patients:  EntrepreneurPulse.com.au  Fact Sheet for Healthcare Providers:  IncredibleEmployment.be  This test is no                          t yet approved or cleared by the [place] FDA and  has been authorized for detection and/or diagnosis of SARS-CoV-2 by FDA under an Emergency Use Authorization (EUA). This EUA will remain  in effect (meaning this test can be used) for the duration of the COVID-19 declaration under Section 564(b)(1) of the Act, 21 U.S.C.section 360bbb-3(b)(1), unless the authorization is terminated  or  revoked sooner.       Influenza A by PCR 06/07/2022 NEGATIVE  NEGATIVE Final   Influenza B by PCR 06/07/2022 NEGATIVE  NEGATIVE Final   Comment: (NOTE) The Xpert Xpress SARS-CoV-2/FLU/RSV plus assay is intended as an aid in the diagnosis of influenza from Nasopharyngeal swab specimens and should not be used as a sole basis for treatment. Nasal washings and aspirates are unacceptable for Xpert Xpress SARS-CoV-2/FLU/RSV testing.  Fact Sheet for Patients: EntrepreneurPulse.com.au  Fact Sheet for Healthcare Providers: IncredibleEmployment.be  This test is not yet approved or cleared by the [place] FDA and has been authorized for detection and/or diagnosis of SARS-CoV-2 by FDA under an Emergency Use Authorization (EUA). This EUA will remain in effect (meaning this test can be used) for the duration of the COVID-19 declaration under Section 564(b)(1) of the Act, 21 U.S.C. section 360bbb-3(b)(1), unless the authorization is terminated or revoked.  Performed at [facility], [place] [address] [place], [place] [address]    WBC 06/07/2022 5.7  4.0 - 10.5 K/uL Final   RBC 06/07/2022 4.39  3.87 - 5.11 MIL/uL Final   Hemoglobin 06/07/2022 13.2  12.0 - 15.0 g/dL Final   HCT 06/07/2022 40.4  36.0 - 46.0 % Final   MCV 06/07/2022 92.0  80.0 - 100.0 fL Final   MCH 06/07/2022 30.1  26.0 - 34.0 pg Final   MCHC 06/07/2022 32.7  30.0 - 36.0 g/dL Final   RDW 06/07/2022 12.4  11.5 - 15.5 % Final   Platelets 06/07/2022 308  150 - 400 K/uL Final   nRBC 06/07/2022 0.0  0.0 - 0.2 % Final   Neutrophils Relative % 06/07/2022 42  % Final   Neutro Abs 06/07/2022 2.4  1.7 - 7.7 K/uL Final   Lymphocytes Relative 06/07/2022 54  % Final   Lymphs Abs 06/07/2022 3.1  0.7 - 4.0 K/uL Final   Monocytes Relative 06/07/2022 4  % Final   Monocytes Absolute 06/07/2022 0.2  0.1 - 1.0 K/uL Final   Eosinophils Relative 06/07/2022 0  % Final   Eosinophils Absolute  06/07/2022 0.0  0.0 - 0.5 K/uL Final   Basophils Relative 06/07/2022 0  % Final   Basophils Absolute 06/07/2022 0.0  0.0 - 0.1 K/uL Final  Immature Granulocytes 06/07/2022 0  % Final   Abs Immature Granulocytes 06/07/2022 0.01  0.00 - 0.07 K/uL Final   Performed at [facility], [address], [place], [place] [address]   Sodium 06/07/2022 139  135 - 145 mmol/L Final   Potassium 06/07/2022 4.0  3.5 - 5.1 mmol/L Final   Chloride 06/07/2022 104  98 - 111 mmol/L Final   CO2 06/07/2022 28  22 - 32 mmol/L Final   Glucose, Bld 06/07/2022 104 (H)  70 - 99 mg/dL Final   Glucose reference range applies only to samples taken after fasting for at least 8 hours.   BUN 06/07/2022 8  6 - 20 mg/dL Final   Creatinine, Ser 06/07/2022 0.84  0.44 - 1.00 mg/dL Final   Calcium 60/45/4098 9.1  8.9 - 10.3 mg/dL Final   Total Protein 11/91/4782 6.9  6.5 - 8.1 g/dL Final   Albumin 95/62/1308 3.7  3.5 - 5.0 g/dL Final   AST 65/78/4696 19  15 - 41 U/L Final   ALT 06/07/2022 22  0 - 44 U/L Final   Alkaline Phosphatase 06/07/2022 54  38 - 126 U/L Final   Total Bilirubin 06/07/2022 0.4  0.3 - 1.2 mg/dL Final   GFR, Estimated 06/07/2022 >60  >60 mL/min Final   Comment: (NOTE) Calculated using the CKD-EPI Creatinine Equation (2021)    Anion gap 06/07/2022 7  5 - 15 Final   Performed at [facility], [address], [place], [place] [address]   Hgb A1c MFr Bld 06/07/2022 5.0  4.8 - 5.6 % Final   Comment: (NOTE) Pre diabetes:          5.7%-6.4%  Diabetes:              >6.4%  Glycemic control for   <7.0% adults with diabetes    Mean Plasma Glucose 06/07/2022 96.8  mg/dL Final   Performed at [facility], [address], [place], [place] [address]   TSH 06/07/2022 1.620  0.350 - 4.500 uIU/mL Final   Comment: Performed by a 3rd Generation assay with a functional sensitivity of <=0.01 uIU/mL. Performed at [facility], [address] [place], [place] [address]    RPR Ser Ql 06/07/2022  NON REACTIVE  NON REACTIVE Final   Performed at [facility], [address], [place], [place] [address]   Color, Urine 06/07/2022 YELLOW  YELLOW Final   APPearance 06/07/2022 HAZY (A)  CLEAR Final   Specific Gravity, Urine 06/07/2022 1.018  1.005 - 1.030 Final   pH 06/07/2022 7.0  5.0 - 8.0 Final   Glucose, UA 06/07/2022 NEGATIVE  NEGATIVE mg/dL Final   Hgb urine dipstick 06/07/2022 NEGATIVE  NEGATIVE Final   Bilirubin Urine 06/07/2022 NEGATIVE  NEGATIVE Final   Ketones, ur 06/07/2022 NEGATIVE  NEGATIVE mg/dL Final   Protein, ur 64/40/3474 NEGATIVE  NEGATIVE mg/dL Final   Nitrite 25/95/6387 NEGATIVE  NEGATIVE Final   Leukocytes,Ua 06/07/2022 NEGATIVE  NEGATIVE Final   Performed at [facility], [address] [place], [place] [address]   Cholesterol 06/07/2022 164  0 - 200 mg/dL Final   Triglycerides 29/51/8841 158 (H)  <150 mg/dL Final   HDL 66/03/3015 44  >40 mg/dL Final   Total CHOL/HDL Ratio 06/07/2022 3.7  RATIO Final   VLDL 06/07/2022 32  0 - 40 mg/dL Final   LDL Cholesterol 06/07/2022 88  0 - 99 mg/dL Final   Comment:  Total Cholesterol/HDL:CHD Risk Coronary Heart Disease Risk Table                     Men   Women  1/2 Average Risk   3.4   3.3  Average Risk       5.0   4.4  2 X Average Risk   9.6   7.1  3 X Average Risk  23.4   11.0        Use the calculated Patient Ratio above and the CHD Risk Table to determine the patient's CHD Risk.        ATP III CLASSIFICATION (LDL):  <100     mg/dL   Optimal  161-096  mg/dL   Near or Above                    Optimal  130-159  mg/dL   Borderline  045-409  mg/dL   High  >811     mg/dL   Very High Performed at [facility], [address] [place], [place] [address]    HIV Screen 4th Generation wRfx 06/07/2022 Non Reactive  Non Reactive Final   Performed at [facility], [address] [place], [place] [address]   SARSCOV2ONAVIRUS 2 AG 06/07/2022 NEGATIVE  NEGATIVE Final   Comment:  (NOTE) SARS-CoV-2 antigen NOT DETECTED.   Negative results are presumptive.  Negative results do not preclude SARS-CoV-2 infection and should not be used as the sole basis for treatment or other patient management decisions, including infection  control decisions, particularly in the presence of clinical signs and  symptoms consistent with COVID-19, or in those who have been in contact with the virus.  Negative results must be combined with clinical observations, patient history, and epidemiological information. The expected result is Negative.  Fact Sheet for Patients: https://www.jennings-kim.com/  Fact Sheet for Healthcare Providers: https://alexander-rogers.biz/  This test is not yet approved or cleared by the [place] FDA and  has been authorized for detection and/or diagnosis of SARS-CoV-2 by FDA under an Emergency Use Authorization (EUA).  This EUA will remain in effect (meaning this test can be used) for the duration of  the COV                          ID-19 declaration under Section 564(b)(1) of the Act, 21 U.S.C. section 360bbb-3(b)(1), unless the authorization is terminated or revoked sooner.     POC Amphetamine UR 06/07/2022 None Detected  NONE DETECTED (Cut Off Level 1000 ng/mL) Final   POC Secobarbital (BAR) 06/07/2022 None Detected  NONE DETECTED (Cut Off Level 300 ng/mL) Final   POC Buprenorphine (BUP) 06/07/2022 None Detected  NONE DETECTED (Cut Off Level 10 ng/mL) Final   POC Oxazepam (BZO) 06/07/2022 None Detected  NONE DETECTED (Cut Off Level 300 ng/mL) Final   POC Cocaine UR 06/07/2022 None Detected  NONE DETECTED (Cut Off Level 300 ng/mL) Final   POC Methamphetamine UR 06/07/2022 None Detected  NONE DETECTED (Cut Off Level 1000 ng/mL) Final   POC Morphine 06/07/2022 None Detected  NONE DETECTED (Cut Off Level 300 ng/mL) Final   POC Methadone UR 06/07/2022 None Detected  NONE DETECTED (Cut Off Level 300 ng/mL) Final   POC Oxycodone  UR 06/07/2022 None Detected  NONE DETECTED (Cut Off Level 100 ng/mL) Final   POC Marijuana UR 06/07/2022 None Detected  NONE DETECTED (Cut Off Level 50 ng/mL) Final  Preg Test, Ur 06/08/2022 NEGATIVE  NEGATIVE Final   Comment:        THE SENSITIVITY OF THIS METHODOLOGY IS >20 mIU/mL. Performed at [facility], [address] [place], [place] [id number]    Preg Test, Ur 06/08/2022 NEGATIVE  NEGATIVE Final   Comment:        THE SENSITIVITY OF THIS METHODOLOGY IS >24 mIU/mL     Blood Alcohol level:  Lab Results  Component Value Date   ETH <10 07/04/2022   ETH <10 02/07/2022    Metabolic Disorder Labs: Lab Results  Component Value Date   HGBA1C 5.7 (H) 10/15/2022   MPG 117 10/15/2022   MPG 96.8 06/07/2022   No results found for: "PROLACTIN" Lab Results  Component Value Date   CHOL 181 07/25/2022   TRIG 118 07/25/2022   HDL 45 07/25/2022   CHOLHDL 4.0 07/25/2022   VLDL 24 07/25/2022   LDLCALC 112 (H) 07/25/2022   LDLCALC 88 06/07/2022    Therapeutic Lab Levels: No results found for: "LITHIUM" No results found for: "VALPROATE" No results found for: "CBMZ"  Physical Findings   GAD-7    Flowsheet Row [facility]  Total GAD-7 Score 17      PHQ2-9    Flowsheet [facility]  PHQ-2 Total Score 0 1 2  PHQ-9 Total Score -- 2 8      Flowsheet Row [facility] Most recent reading at 11/07/2022  9:32 AM [facility] Most recent reading at 10/07/2022 12:18 PM [facility] Most recent reading at 10/06/2022  9:38 AM  C-SSRS RISK CATEGORY  No Risk No Risk No Risk        Musculoskeletal  Strength & Muscle Tone: within normal limits Gait & Station: normal Patient leans: N/A  Psychiatric Specialty Exam  Presentation  General Appearance:  Appropriate for Environment; Casual  Eye Contact: Good  Speech: Clear and Coherent; Normal Rate  Speech Volume: Normal  Handedness: Right   Mood and Affect  Mood: Euthymic  Affect: Congruent   Thought Process  Thought Processes: Coherent  Descriptions of Associations:Intact  Orientation:Full (Time, Place and Person)  Thought Content:Logical  Diagnosis of Schizophrenia or Schizoaffective disorder in past: Yes  Duration of Psychotic Symptoms: Greater than six months   Hallucinations:Hallucinations: None  Ideas of Reference:None  Suicidal Thoughts:Suicidal Thoughts: No  Homicidal Thoughts:Homicidal Thoughts: No   Sensorium  Memory: Immediate Good; Immediate Poor; Remote Good  Judgment: Good  Insight: Good   Executive Functions  Concentration: Good  Attention Span: Good  Recall: Good  Fund of Knowledge: Good  Language: Good   Psychomotor Activity  Psychomotor Activity: Psychomotor Activity: Normal   Assets  Assets: Communication Skills; Desire for Improvement; Physical Health; Resilience; Social Support   Sleep  Sleep: Sleep: Good   No data recorded  Physical Exam  Physical Exam Vitals and nursing note reviewed.  Constitutional:      General: She is not in acute distress.    Appearance: Normal appearance. She is not ill-appearing.  HENT:     Head: Normocephalic.  Eyes:     General:  Right eye: No discharge.        Left eye: No discharge.     Conjunctiva/sclera: Conjunctivae normal.  Cardiovascular:     Rate and Rhythm: Normal rate.  Pulmonary:     Effort: Pulmonary effort is normal.  Musculoskeletal:        General: Normal range of motion.     Cervical back: Normal range of motion.  Skin:     Coloration: Skin is not jaundiced or pale.  Neurological:     Mental Status: She is alert and oriented to person, place, and time.  Psychiatric:        Attention and Perception: Attention and perception normal.        Mood and Affect: Mood and affect normal.        Speech: Speech normal.        Behavior: Behavior normal. Behavior is cooperative.        Thought Content: Thought content normal.        Cognition and Memory: Cognition normal.        Judgment: Judgment normal.    Review of Systems  Constitutional: Negative.   HENT: Negative.    Eyes: Negative.   Respiratory: Negative.    Cardiovascular: Negative.   Musculoskeletal: Negative.   Skin: Negative.   Neurological: Negative.   Psychiatric/Behavioral: Negative.     Blood pressure 118/65, pulse 88, temperature 97.7 F (36.5 C), temperature source Tympanic, resp. rate 16, height 5\' 1"  (1.549 m), weight 198 lb (89.8 kg), SpO2 100 %. Body mass index is 37.41 kg/m.  Treatment Plan Summary: Plan Pt remains psychiatrically cleared. Disposition pending placement.    Will continue to have Daily contact with patient to assess and evaluate symptoms and progress in treatment and Medication management  , NP 11/17/2022 4:29 PM

## 2022-11-17 NOTE — ED Notes (Signed)
Received patient this PM. Patient in bed watching television talking with peer. Patient respirations are even and unlabored. Will continue to monitor for safety.

## 2022-11-17 NOTE — ED Notes (Signed)
Patient is upset about her medications. She has now requested to be discharged, I advised her that the provider has been notified.

## 2022-11-17 NOTE — ED Notes (Signed)
Pt is currently sleeping, no distress noted, environmental check complete, will continue to monitor patient for safety. ? ?

## 2022-11-17 NOTE — ED Notes (Signed)
The patient is quietly sitting in bed working on box cutouts that she has been making, no distress noted, will continue to monitor patient for safety.

## 2022-11-18 ENCOUNTER — Encounter (HOSPITAL_COMMUNITY): Payer: Self-pay | Admitting: Registered Nurse

## 2022-11-18 DIAGNOSIS — N390 Urinary tract infection, site not specified: Secondary | ICD-10-CM | POA: Diagnosis not present

## 2022-11-18 DIAGNOSIS — N9489 Other specified conditions associated with female genital organs and menstrual cycle: Secondary | ICD-10-CM | POA: Diagnosis not present

## 2022-11-18 DIAGNOSIS — F333 Major depressive disorder, recurrent, severe with psychotic symptoms: Secondary | ICD-10-CM | POA: Diagnosis not present

## 2022-11-18 DIAGNOSIS — Z1152 Encounter for screening for COVID-19: Secondary | ICD-10-CM | POA: Diagnosis not present

## 2022-11-18 DIAGNOSIS — F209 Schizophrenia, unspecified: Secondary | ICD-10-CM | POA: Diagnosis not present

## 2022-11-18 DIAGNOSIS — F17200 Nicotine dependence, unspecified, uncomplicated: Secondary | ICD-10-CM | POA: Diagnosis not present

## 2022-11-18 DIAGNOSIS — E118 Type 2 diabetes mellitus with unspecified complications: Secondary | ICD-10-CM | POA: Diagnosis not present

## 2022-11-18 DIAGNOSIS — F419 Anxiety disorder, unspecified: Secondary | ICD-10-CM | POA: Diagnosis not present

## 2022-11-18 DIAGNOSIS — E039 Hypothyroidism, unspecified: Secondary | ICD-10-CM | POA: Diagnosis not present

## 2022-11-18 NOTE — ED Notes (Signed)
Patient resting quietly in bed with eyes closed. Respirations equal and unlabored, skin warm and dry, NAD. Routine safety checks conducted according to facility protocol. Will continue to monitor for safety.  

## 2022-11-18 NOTE — Care Management (Signed)
OBS Care Management  Patient completed a virtual assessment with [facility]

## 2022-11-18 NOTE — ED Notes (Addendum)
Pt sitting on bed at this hour. No apparent distress. RR even and unlabored. Pt was happy because she got to see her daughter on video. Pt still waiting for placement at the group home. States that her knee hurts today (9/10). Doesn't know the cause. No injury noted. Monitored for safety.

## 2022-11-18 NOTE — ED Provider Notes (Signed)
Behavioral Health Progress Note  Date and Time: 11/18/2022 9:44 AM Name: Nicole [name] MRN:  [id number]  HPI:  Nicole MassonLatasha [name] is a 32 y/o female with PMH of SCZ unspecified, MDD with psychosis, who [facility] via [employer] after a verbal altercation with Jeannett SeniorJosephine [name] (not patient's legal guardian) for transient SI. This [facility]. Patient has been dismissed from previous group home due to threatening SI and HI, then leaving the group home. 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    Subjective:  "I'm suppose to have a meeting today, do you know what time."  Patient seen face to face by this provider, consulted with Dr. Nelly RoutArchana Kumar; and chart reviewed on 11/18/22.  On evaluation Nicole [name] reports she continues to do well with no complaints at this time.  Reports she is eating/sleeping without difficulty and tolerating medications without adverse reactions.  Patient states she is scheduled to have another meeting today but doesn't know what time it will occur.  Spoke with Child psychotherapistsocial worker and [facility] at [facility]:45 PM.  Social work states she will go in and speak with patient.   During evaluation Nicole MassonLatasha [name] is lying on bed with no noted distress.  He is alert/oriented x 4, calm, cooperative, and attentive.  Her responses were appropriated to assessment questions.  Her mood is euthymic with congruent affect.  She spoke in a clear tone at moderate volume, and normal pace, with good eye contact.   She denies suicidal/self-harm/homicidal ideation, psychosis, and paranoia.  Objectively:  there is no evidence of psychosis/mania or delusional thinking.  She conversed coherently, with goal directed thoughts, and no distractibility, or pre-occupation and she continues to deny suicidal/self-harm/homicidal ideation, psychosis, and paranoia.  Per previous note:  "Per  Merci, [facility] will be able to complete a psychological evaluation on Monday November 18, 2022 at 5:45 pm virtually. The next Treatment Team meeting will be on 11-20-2022 (Wednesday) at 1 pm"  Patient remains psychiatrically cleared. TOC/social worker/DSS continues to seek placement   Diagnosis:  Final diagnoses:  Tobacco use disorder  Schizophrenia, unspecified type (HCC)  MDD (major depressive disorder), recurrent episode, moderate (HCC)  Hypothyroidism, unspecified type    Total Time spent with patient: 15 minutes  Past Psychiatric History: as documented in H&P Past Medical History: as documented in H&P Family History: as documented in H&P Family Psychiatric  History: as documented in H&P Social History: as documented in H&P  Additional Social History: as documented in H&P   Sleep: Good  Appetite:  Good  Current Medications:  Current Facility-Administered Medications  Medication Dose Route Frequency Provider Last Rate Last Admin   acetaminophen (TYLENOL) tablet 650 mg  650 mg Oral Q6H PRN Marlou SaByungura, Veronique M, NP   650 mg at 11/04/22 2154   alum & mag hydroxide-simeth (MAALOX/MYLANTA) 200-200-20 MG/5ML suspension 30 mL  30 mL Oral Q4H PRN Marlou SaByungura, Veronique M, NP   30 mL at 11/16/22 1849   ARIPiprazole ER (ABILIFY MAINTENA) 400 MG prefilled syringe 400 mg  400 mg Intramuscular Q28 days Princess BruinsNguyen, Julie, DO   400 mg at 10/25/22 0920   cetaphil lotion   Topical PRN Princess BruinsNguyen, Julie, DO       fluticasone [facility](FLONASE) 50 MCG/ACT nasal spray 1 spray  1 spray Each Nare QHS Lauree ChandlerLee, Jacqueline Eun, NP   1 spray at 11/17/22 2217   hydrocortisone cream 1 %  Topical QID PRN Onuoha, Chinwendu V, NP   Given at 11/14/22 2979   hydrOXYzine (ATARAX) tablet 25 mg  25 mg Oral TID PRN Lorri Frederick, MD   25 mg at 11/14/22 2201   ketoconazole (NIZORAL) 2 % cream   Topical BID Princess Bruins, DO   Given at 11/17/22 2216   levothyroxine (SYNTHROID) tablet 100 mcg  100 mcg Oral Once  Marlou Sa, NP       levothyroxine (SYNTHROID) tablet 100 mcg  100 mcg Oral Q0600 Marlou Sa, NP   100 mcg at 11/18/22 8921   lidocaine (LIDODERM) 5 % 1 patch  1 patch Transdermal Q24H Princess Bruins, DO   1 patch at 11/17/22 1037   loratadine (CLARITIN) tablet 10 mg  10 mg Oral Daily Carrion-Carrero, Karle Starch, MD   10 mg at 11/18/22 0911   magnesium hydroxide (MILK OF MAGNESIA) suspension 30 mL  30 mL Oral Daily PRN Marlou Sa, NP   30 mL at 10/12/22 2145   metFORMIN (GLUCOPHAGE) tablet 500 mg  500 mg Oral Q breakfast Carrion-Carrero, Karle Starch, MD   500 mg at 11/18/22 0847   nicotine (NICODERM CQ - dosed in mg/24 hours) patch 14 mg  14 mg Transdermal Daily PRN Carrion-Carrero, Karle Starch, MD       ondansetron (ZOFRAN-ODT) disintegrating tablet 4 mg  4 mg Oral Q8H PRN Carrion-Carrero, Margely, MD   4 mg at 11/16/22 1849   Oxcarbazepine (TRILEPTAL) tablet 300 mg  300 mg Oral BID Byungura, Veronique M, NP   300 mg at 11/18/22 0911   pantoprazole (PROTONIX) EC tablet 40 mg  40 mg Oral Daily Carrion-Carrero, Karle Starch, MD   40 mg at 11/18/22 0911   polyethylene glycol (MIRALAX / GLYCOLAX) packet 17 g  17 g Oral Daily Princess Bruins, DO   17 g at 11/18/22 0913   QUEtiapine (SEROQUEL) tablet 400 mg  400 mg Oral BID Rayburn Go, Veronique M, NP   400 mg at 11/18/22 0910   sertraline (ZOLOFT) tablet 150 mg  150 mg Oral Daily Carrion-Carrero, Margely, MD   150 mg at 11/18/22 0911   sodium chloride (OCEAN) 0.65 % nasal spray 1 spray  1 spray Each Nare Daily Princess Bruins, DO   1 spray at 11/18/22 0916   traZODone (DESYREL) tablet 100 mg  100 mg Oral QHS Olin Pia M, NP   100 mg at 11/17/22 2215   traZODone (DESYREL) tablet 50 mg  50 mg Oral QHS PRN Marlou Sa, NP   50 mg at 11/17/22 2226   valACYclovir (VALTREX) tablet 500 mg  500 mg Oral Daily Carrion-Carrero, Karle Starch, MD   500 mg at 11/18/22 0909   zinc oxide (BALMEX) 11.3 % cream   Topical PRN Princess Bruins, DO        Current Outpatient Medications  Medication Sig Dispense Refill   ABILIFY MAINTENA 400 MG PRSY prefilled syringe 400 mg every 28 (twenty-eight) days.     cetirizine (ZYRTEC) 10 MG tablet Take 10 mg by mouth daily.     cyclobenzaprine (FLEXERIL) 10 MG tablet Take 1 tablet (10 mg total) by mouth 2 (two) times daily as needed for muscle spasms. 20 tablet 0   fluticasone (FLONASE) 50 MCG/ACT nasal spray Place 1 spray into both nostrils daily.     meloxicam (MOBIC) 15 MG tablet Take 15 mg by mouth daily.     nitrofurantoin, macrocrystal-monohydrate, (MACROBID) 100 MG capsule Take 1 capsule (100 mg total) by mouth 2 (two) times daily. 10 capsule  0   ondansetron (ZOFRAN-ODT) 4 MG disintegrating tablet Take 1 tablet (4 mg total) by mouth every 8 (eight) hours as needed for nausea or vomiting. 20 tablet 0   Oxcarbazepine (TRILEPTAL) 300 MG tablet Take 1 tablet (300 mg total) by mouth 2 (two) times daily. 60 tablet 0   QUEtiapine (SEROQUEL) 400 MG tablet Take 1 tablet (400 mg total) by mouth 2 (two) times daily. 60 tablet 0   sertraline (ZOLOFT) 50 MG tablet Take 3 tablets (150 mg total) by mouth in the morning. 90 tablet 0   traZODone (DESYREL) 100 MG tablet Take 1 tablet (100 mg total) by mouth at bedtime. 30 tablet 0   valACYclovir (VALTREX) 500 MG tablet Take 500 mg by mouth daily.      Labs  Lab Results:  Admission on 10/07/2022  Component Date Value Ref Range Status   Hgb A1c MFr Bld 10/15/2022 5.7 (H)  4.8 - 5.6 % Final   Comment: (NOTE)         Prediabetes: 5.7 - 6.4         Diabetes: >6.4         Glycemic control for adults with diabetes: <7.0    Mean Plasma Glucose 10/15/2022 117  mg/dL Final   Comment: (NOTE) Performed At: [facility] [address] [place], [place] [id number] Jolene Schimke MD UU:[id number]    Color, Urine 11/13/2022 YELLOW  YELLOW Final   APPearance 11/13/2022 CLOUDY (A)  CLEAR Final   Specific Gravity, Urine 11/13/2022 1.023  1.005 - 1.030 Final    pH 11/13/2022 6.0  5.0 - 8.0 Final   Glucose, UA 11/13/2022 NEGATIVE  NEGATIVE mg/dL Final   Hgb urine dipstick 11/13/2022 NEGATIVE  NEGATIVE Final   Bilirubin Urine 11/13/2022 NEGATIVE  NEGATIVE Final   Ketones, ur 11/13/2022 NEGATIVE  NEGATIVE mg/dL Final   Protein, ur 47/42/5956 NEGATIVE  NEGATIVE mg/dL Final   Nitrite 38/75/6433 NEGATIVE  NEGATIVE Final   Leukocytes,Ua 11/13/2022 NEGATIVE  NEGATIVE Final   Performed at [facility], [address] [place], [place] [address]  Office Visit on 10/21/2022  Component Date Value Ref Range Status   SARSCOV2ONAVIRUS 2 AG 11/01/2022 NEGATIVE  NEGATIVE Final   Comment: (NOTE) SARS-CoV-2 antigen NOT DETECTED.   Negative results are presumptive.  Negative results do not preclude SARS-CoV-2 infection and should not be used as the sole basis for treatment or other patient management decisions, including infection  control decisions, particularly in the presence of clinical signs and  symptoms consistent with COVID-19, or in those who have been in contact with the virus.  Negative results must be combined with clinical observations, patient history, and epidemiological information. The expected result is Negative.  Fact Sheet for Patients: https://www.jennings-kim.com/  Fact Sheet for Healthcare Providers: https://alexander-rogers.biz/  This test is not yet approved or cleared by the [place] FDA and  has been authorized for detection and/or diagnosis of SARS-CoV-2 by FDA under an Emergency Use Authorization (EUA).  This EUA will remain in effect (meaning this test can be used) for the duration of  the COV                          ID-19 declaration under Section 564(b)(1) of the Act, 21 U.S.C. section 360bbb-3(b)(1), unless the authorization is terminated or revoked sooner.    Admission on 10/07/2022, Discharged on 10/07/2022  Component Date Value Ref Range Status   Color, Urine 10/07/2022 YELLOW   YELLOW  Final   APPearance 10/07/2022 HAZY (A)  CLEAR Final   Specific Gravity, Urine 10/07/2022 1.011  1.005 - 1.030 Final   pH 10/07/2022 7.0  5.0 - 8.0 Final   Glucose, UA 10/07/2022 NEGATIVE  NEGATIVE mg/dL Final   Hgb urine dipstick 10/07/2022 SMALL (A)  NEGATIVE Final   Bilirubin Urine 10/07/2022 NEGATIVE  NEGATIVE Final   Ketones, ur 10/07/2022 NEGATIVE  NEGATIVE mg/dL Final   Protein, ur 10/07/2022 NEGATIVE  NEGATIVE mg/dL Final   Nitrite 10/07/2022 NEGATIVE  NEGATIVE Final   Leukocytes,Ua 10/07/2022 LARGE (A)  NEGATIVE Final   RBC / HPF 10/07/2022 0-5  0 - 5 RBC/hpf Final   WBC, UA 10/07/2022 0-5  0 - 5 WBC/hpf Final   Bacteria, UA 10/07/2022 FEW (A)  NONE SEEN Final   Squamous Epithelial / HPF 10/07/2022 11-20  0 - 5 Final   Performed at [facility], [place] [address] [place], [place] [address]   I-stat hCG, quantitative 10/07/2022 <5.0  <5 mIU/mL Final   Comment 3 10/07/2022          Final   Comment:   GEST. AGE      CONC.  (mIU/mL)   <=1 WEEK        5 - 50     2 WEEKS       50 - 500     3 WEEKS       100 - 10,000     4 WEEKS     1,000 - 30,000        FEMALE AND NON-PREGNANT FEMALE:     LESS THAN 5 mIU/mL    Sodium 10/07/2022 138  135 - 145 mmol/L Final   Potassium 10/07/2022 4.3  3.5 - 5.1 mmol/L Final   Chloride 10/07/2022 104  98 - 111 mmol/L Final   CO2 10/07/2022 28  22 - 32 mmol/L Final   Glucose, Bld 10/07/2022 90  70 - 99 mg/dL Final   Glucose reference range applies only to samples taken after fasting for at least 8 hours.   BUN 10/07/2022 8  6 - 20 mg/dL Final   Creatinine, Ser 10/07/2022 0.96  0.44 - 1.00 mg/dL Final   Calcium 10/07/2022 9.1  8.9 - 10.3 mg/dL Final   GFR, Estimated 10/07/2022 >60  >60 mL/min Final   Comment: (NOTE) Calculated using the CKD-EPI Creatinine Equation (2021)    Anion gap 10/07/2022 6  5 - 15 Final   Performed at [facility], [place] [address], [place], [place] [address]   WBC 10/07/2022 5.8  4.0 - 10.5 K/uL Final    RBC 10/07/2022 4.36  3.87 - 5.11 MIL/uL Final   Hemoglobin 10/07/2022 12.9  12.0 - 15.0 g/dL Final   HCT 10/07/2022 40.0  36.0 - 46.0 % Final   MCV 10/07/2022 91.7  80.0 - 100.0 fL Final   MCH 10/07/2022 29.6  26.0 - 34.0 pg Final   MCHC 10/07/2022 32.3  30.0 - 36.0 g/dL Final   RDW 10/07/2022 12.4  11.5 - 15.5 % Final   Platelets 10/07/2022 295  150 - 400 K/uL Final   nRBC 10/07/2022 0.0  0.0 - 0.2 % Final   Performed at [facility] [address] [place], [place] [address]   SARS Coronavirus 2 by RT PCR 10/07/2022 NEGATIVE  NEGATIVE Final   Comment: (NOTE) SARS-CoV-2 target nucleic acids are NOT DETECTED.  The SARS-CoV-2 RNA is generally detectable in upper respiratory specimens during the acute  phase of infection. The lowest concentration of SARS-CoV-2 viral copies this assay can detect is 138 copies/mL. A negative result does not preclude SARS-Cov-2 infection and should not be used as the sole basis for treatment or other patient management decisions. A negative result may occur with  improper specimen collection/handling, submission of specimen other than nasopharyngeal swab, presence of viral mutation(s) within the areas targeted by this assay, and inadequate number of viral copies(<138 copies/mL). A negative result must be combined with clinical observations, patient history, and epidemiological information. The expected result is Negative.  Fact Sheet for Patients:  BloggerCourse.com  Fact Sheet for Healthcare Providers:  SeriousBroker.it  This test is no                          t yet approved or cleared by the [place] FDA and  has been authorized for detection and/or diagnosis of SARS-CoV-2 by FDA under an Emergency Use Authorization (EUA). This EUA will remain  in effect (meaning this test can be used) for the duration of the COVID-19 declaration under Section 564(b)(1) of the Act, 21 U.S.C.section  360bbb-3(b)(1), unless the authorization is terminated  or revoked sooner.       Influenza A by PCR 10/07/2022 NEGATIVE  NEGATIVE Final   Influenza B by PCR 10/07/2022 NEGATIVE  NEGATIVE Final   Comment: (NOTE) The Xpert Xpress SARS-CoV-2/FLU/RSV plus assay is intended as an aid in the diagnosis of influenza from Nasopharyngeal swab specimens and should not be used as a sole basis for treatment. Nasal washings and aspirates are unacceptable for Xpert Xpress SARS-CoV-2/FLU/RSV testing.  Fact Sheet for Patients: BloggerCourse.com  Fact Sheet for Healthcare Providers: SeriousBroker.it  This test is not yet approved or cleared by the [place] FDA and has been authorized for detection and/or diagnosis of SARS-CoV-2 by FDA under an Emergency Use Authorization (EUA). This EUA will remain in effect (meaning this test can be used) for the duration of the COVID-19 declaration under Section 564(b)(1) of the Act, 21 U.S.C. section 360bbb-3(b)(1), unless the authorization is terminated or revoked.     Resp Syncytial Virus by PCR 10/07/2022 NEGATIVE  NEGATIVE Final   Comment: (NOTE) Fact Sheet for Patients: BloggerCourse.com  Fact Sheet for Healthcare Providers: SeriousBroker.it  This test is not yet approved or cleared by the [place] FDA and has been authorized for detection and/or diagnosis of SARS-CoV-2 by FDA under an Emergency Use Authorization (EUA). This EUA will remain in effect (meaning this test can be used) for the duration of the COVID-19 declaration under Section 564(b)(1) of the Act, 21 U.S.C. section 360bbb-3(b)(1), unless the authorization is terminated or revoked.  Performed at [facility], [address], [place], [place] [address]    Specimen Description 10/07/2022 URINE, CLEAN CATCH   Final   Special Requests 10/07/2022    Final                    Value:NONE Performed at [facility], [address] [place], [place] [address]    Culture 10/07/2022 MULTIPLE SPECIES PRESENT, SUGGEST RECOLLECTION (A)   Final   Report Status 10/07/2022 10/08/2022 FINAL   Final  Admission on 07/25/2022, Discharged on 10/07/2022  Component Date Value Ref Range Status   SARS Coronavirus 2 by RT PCR 07/25/2022 NEGATIVE  NEGATIVE Final   Comment: (NOTE) SARS-CoV-2 target nucleic acids are NOT DETECTED.  The SARS-CoV-2 RNA is generally detectable in upper respiratory specimens  during the acute phase of infection. The lowest concentration of SARS-CoV-2 viral copies this assay can detect is 138 copies/mL. A negative result does not preclude SARS-Cov-2 infection and should not be used as the sole basis for treatment or other patient management decisions. A negative result may occur with  improper specimen collection/handling, submission of specimen other than nasopharyngeal swab, presence of viral mutation(s) within the areas targeted by this assay, and inadequate number of viral copies(<138 copies/mL). A negative result must be combined with clinical observations, patient history, and epidemiological information. The expected result is Negative.  Fact Sheet for Patients:  BloggerCourse.com  Fact Sheet for Healthcare Providers:  SeriousBroker.it  This test is no                          t yet approved or cleared by the [place] FDA and  has been authorized for detection and/or diagnosis of SARS-CoV-2 by FDA under an Emergency Use Authorization (EUA). This EUA will remain  in effect (meaning this test can be used) for the duration of the COVID-19 declaration under Section 564(b)(1) of the Act, 21 U.S.C.section 360bbb-3(b)(1), unless the authorization is terminated  or revoked sooner.       Influenza A by PCR 07/25/2022 NEGATIVE  NEGATIVE Final   Influenza B by PCR 07/25/2022 NEGATIVE   NEGATIVE Final   Comment: (NOTE) The Xpert Xpress SARS-CoV-2/FLU/RSV plus assay is intended as an aid in the diagnosis of influenza from Nasopharyngeal swab specimens and should not be used as a sole basis for treatment. Nasal washings and aspirates are unacceptable for Xpert Xpress SARS-CoV-2/FLU/RSV testing.  Fact Sheet for Patients: BloggerCourse.com  Fact Sheet for Healthcare Providers: SeriousBroker.it  This test is not yet approved or cleared by the [place] FDA and has been authorized for detection and/or diagnosis of SARS-CoV-2 by FDA under an Emergency Use Authorization (EUA). This EUA will remain in effect (meaning this test can be used) for the duration of the COVID-19 declaration under Section 564(b)(1) of the Act, 21 U.S.C. section 360bbb-3(b)(1), unless the authorization is terminated or revoked.  Performed at [facility], [address] [place], [place] [address]    WBC 07/25/2022 8.3  4.0 - 10.5 K/uL Final   RBC 07/25/2022 4.44  3.87 - 5.11 MIL/uL Final   Hemoglobin 07/25/2022 13.7  12.0 - 15.0 g/dL Final   HCT 60/45/4098 40.2  36.0 - 46.0 % Final   MCV 07/25/2022 90.5  80.0 - 100.0 fL Final   MCH 07/25/2022 30.9  26.0 - 34.0 pg Final   MCHC 07/25/2022 34.1  30.0 - 36.0 g/dL Final   RDW 11/91/4782 12.2  11.5 - 15.5 % Final   Platelets 07/25/2022 248  150 - 400 K/uL Final   nRBC 07/25/2022 0.0  0.0 - 0.2 % Final   Neutrophils Relative % 07/25/2022 43  % Final   Neutro Abs 07/25/2022 3.6  1.7 - 7.7 K/uL Final   Lymphocytes Relative 07/25/2022 52  % Final   Lymphs Abs 07/25/2022 4.2 (H)  0.7 - 4.0 K/uL Final   Monocytes Relative 07/25/2022 5  % Final   Monocytes Absolute 07/25/2022 0.4  0.1 - 1.0 K/uL Final   Eosinophils Relative 07/25/2022 0  % Final   Eosinophils Absolute 07/25/2022 0.0  0.0 - 0.5 K/uL Final   Basophils Relative 07/25/2022 0  % Final   Basophils Absolute 07/25/2022 0.0  0.0 - 0.1  K/uL Final   Immature  Granulocytes 07/25/2022 0  % Final   Abs Immature Granulocytes 07/25/2022 0.02  0.00 - 0.07 K/uL Final   Performed at [facility], [address], [place], [place] [address]   Sodium 07/25/2022 138  135 - 145 mmol/L Final   Potassium 07/25/2022 4.0  3.5 - 5.1 mmol/L Final   Chloride 07/25/2022 104  98 - 111 mmol/L Final   CO2 07/25/2022 29  22 - 32 mmol/L Final   Glucose, Bld 07/25/2022 83  70 - 99 mg/dL Final   Glucose reference range applies only to samples taken after fasting for at least 8 hours.   BUN 07/25/2022 11  6 - 20 mg/dL Final   Creatinine, Ser 07/25/2022 0.97  0.44 - 1.00 mg/dL Final   Calcium 60/45/4098 9.2  8.9 - 10.3 mg/dL Final   Total Protein 11/91/4782 7.0  6.5 - 8.1 g/dL Final   Albumin 95/62/1308 3.8  3.5 - 5.0 g/dL Final   AST 65/78/4696 18  15 - 41 U/L Final   ALT 07/25/2022 22  0 - 44 U/L Final   Alkaline Phosphatase 07/25/2022 64  38 - 126 U/L Final   Total Bilirubin 07/25/2022 0.2 (L)  0.3 - 1.2 mg/dL Final   GFR, Estimated 07/25/2022 >60  >60 mL/min Final   Comment: (NOTE) Calculated using the CKD-EPI Creatinine Equation (2021)    Anion gap 07/25/2022 5  5 - 15 Final   Performed at [facility], [address], [place], [place] [address]   POC Amphetamine UR 07/25/2022 None Detected  NONE DETECTED (Cut Off Level 1000 ng/mL) Preliminary   POC Secobarbital (BAR) 07/25/2022 None Detected  NONE DETECTED (Cut Off Level 300 ng/mL) Preliminary   POC Buprenorphine (BUP) 07/25/2022 None Detected  NONE DETECTED (Cut Off Level 10 ng/mL) Preliminary   POC Oxazepam (BZO) 07/25/2022 None Detected  NONE DETECTED (Cut Off Level 300 ng/mL) Preliminary   POC Cocaine UR 07/25/2022 None Detected  NONE DETECTED (Cut Off Level 300 ng/mL) Preliminary   POC Methamphetamine UR 07/25/2022 None Detected  NONE DETECTED (Cut Off Level 1000 ng/mL) Preliminary   POC Morphine 07/25/2022 None Detected  NONE DETECTED (Cut Off Level 300 ng/mL) Preliminary    POC Methadone UR 07/25/2022 None Detected  NONE DETECTED (Cut Off Level 300 ng/mL) Preliminary   POC Oxycodone UR 07/25/2022 None Detected  NONE DETECTED (Cut Off Level 100 ng/mL) Preliminary   POC Marijuana UR 07/25/2022 None Detected  NONE DETECTED (Cut Off Level 50 ng/mL) Preliminary   SARSCOV2ONAVIRUS 2 AG 07/25/2022 NEGATIVE  NEGATIVE Final   Comment: (NOTE) SARS-CoV-2 antigen NOT DETECTED.   Negative results are presumptive.  Negative results do not preclude SARS-CoV-2 infection and should not be used as the sole basis for treatment or other patient management decisions, including infection  control decisions, particularly in the presence of clinical signs and  symptoms consistent with COVID-19, or in those who have been in contact with the virus.  Negative results must be combined with clinical observations, patient history, and epidemiological information. The expected result is Negative.  Fact Sheet for Patients: https://www.jennings-kim.com/  Fact Sheet for Healthcare Providers: https://alexander-rogers.biz/  This test is not yet approved or cleared by the [place] FDA and  has been authorized for detection and/or diagnosis of SARS-CoV-2 by FDA under an Emergency Use Authorization (EUA).  This EUA will remain in effect (meaning this test can be used) for the duration of  the COV  ID-19 declaration under Section 564(b)(1) of the Act, 21 U.S.C. section 360bbb-3(b)(1), unless the authorization is terminated or revoked sooner.     Cholesterol 07/25/2022 181  0 - 200 mg/dL Final   Triglycerides 40/98/1191 118  <150 mg/dL Final   HDL 47/82/9562 45  >40 mg/dL Final   Total CHOL/HDL Ratio 07/25/2022 4.0  RATIO Final   VLDL 07/25/2022 24  0 - 40 mg/dL Final   LDL Cholesterol 07/25/2022 112 (H)  0 - 99 mg/dL Final   Comment:        Total Cholesterol/HDL:CHD Risk Coronary Heart Disease Risk Table                     Men    Women  1/2 Average Risk   3.4   3.3  Average Risk       5.0   4.4  2 X Average Risk   9.6   7.1  3 X Average Risk  23.4   11.0        Use the calculated Patient Ratio above and the CHD Risk Table to determine the patient's CHD Risk.        ATP III CLASSIFICATION (LDL):  <100     mg/dL   Optimal  130-865  mg/dL   Near or Above                    Optimal  130-159  mg/dL   Borderline  784-696  mg/dL   High  >295     mg/dL   Very High Performed at [facility], [address], [place], [place] [address]    TSH 07/25/2022 6.668 (H)  0.350 - 4.500 uIU/mL Final   Comment: Performed by a 3rd Generation assay with a functional sensitivity of <=0.01 uIU/mL. Performed at [facility], [address], [place], [place] [address]    Glucose-Capillary 07/26/2022 104 (H)  70 - 99 mg/dL Final   Glucose reference range applies only to samples taken after fasting for at least 8 hours.   T3, Free 07/31/2022 2.3  2.0 - 4.4 pg/mL Final   Comment: (NOTE) Performed At: [facility] [address] [place], [place] [id number] Jolene Schimke MD QI:[id number]    Free T4 07/31/2022 0.60 (L)  0.61 - 1.12 ng/dL Final   Comment: (NOTE) Biotin ingestion may interfere with free T4 tests. If the results are inconsistent with the TSH level, previous test results, or the clinical presentation, then consider biotin interference. If needed, order repeat testing after stopping biotin. Performed at [facility], [address] [place], [place] [address]    Glucose-Capillary 08/29/2022 100 (H)  70 - 99 mg/dL Final   Glucose reference range applies only to samples taken after fasting for at least 8 hours.   TSH 09/05/2022 0.793  0.350 - 4.500 uIU/mL Final   Comment: Performed by a 3rd Generation assay with a functional sensitivity of <=0.01 uIU/mL. Performed at [facility], [address], [place], [place] [address]    Free T4 09/05/2022 0.73  0.61 - 1.12 ng/dL Final   Comment:  (NOTE) Biotin ingestion may interfere with free T4 tests. If the results are inconsistent with the TSH level, previous test results, or the clinical presentation, then consider biotin interference. If needed, order repeat testing after stopping biotin. Performed at [facility], [address], [place], [place] [address]    Preg Test, Ur 09/06/2022 Negative  Negative Final   Preg Test, Ur 09/06/2022 NEGATIVE  NEGATIVE Final   Comment:        THE SENSITIVITY OF THIS METHODOLOGY IS >24 mIU/mL    Preg Test, Ur 09/05/2022 NEGATIVE  NEGATIVE Final   Comment:        THE SENSITIVITY OF THIS METHODOLOGY IS >24 mIU/mL    Sodium 09/13/2022 136  135 - 145 mmol/L Final   Potassium 09/13/2022 4.5  3.5 - 5.1 mmol/L Final   Chloride 09/13/2022 105  98 - 111 mmol/L Final   CO2 09/13/2022 21 (L)  22 - 32 mmol/L Final   Glucose, Bld 09/13/2022 111 (H)  70 - 99 mg/dL Final   Glucose reference range applies only to samples taken after fasting for at least 8 hours.   BUN 09/13/2022 14  6 - 20 mg/dL Final   Creatinine, Ser 09/13/2022 0.92  0.44 - 1.00 mg/dL Final   Calcium 16/07/9603 9.1  8.9 - 10.3 mg/dL Final   GFR, Estimated 09/13/2022 >60  >60 mL/min Final   Comment: (NOTE) Calculated using the CKD-EPI Creatinine Equation (2021)    Anion gap 09/13/2022 10  5 - 15 Final   Performed at [facility], [address] [place], [place] [address]   Vitamin B-12 09/13/2022 585  180 - 914 pg/mL Final   Comment: (NOTE) This assay is not validated for testing neonatal or myeloproliferative syndrome specimens for Vitamin B12 levels. Performed at [facility], [address] [place], [place] [address]    Color, Urine 09/14/2022 YELLOW  YELLOW Final   APPearance 09/14/2022 HAZY (A)  CLEAR Final   Specific Gravity, Urine 09/14/2022 1.025  1.005 - 1.030 Final   pH 09/14/2022 5.0  5.0 - 8.0 Final   Glucose, UA 09/14/2022 NEGATIVE  NEGATIVE mg/dL Final   Hgb urine dipstick 09/14/2022  NEGATIVE  NEGATIVE Final   Bilirubin Urine 09/14/2022 NEGATIVE  NEGATIVE Final   Ketones, ur 09/14/2022 NEGATIVE  NEGATIVE mg/dL Final   Protein, ur 78/29/5621 NEGATIVE  NEGATIVE mg/dL Final   Nitrite 30/86/5784 NEGATIVE  NEGATIVE Final   Leukocytes,Ua 09/14/2022 TRACE (A)  NEGATIVE Final   RBC / HPF 09/14/2022 0-5  0 - 5 RBC/hpf Final   WBC, UA 09/14/2022 0-5  0 - 5 WBC/hpf Final   Bacteria, UA 09/14/2022 RARE (A)  NONE SEEN Final   Squamous Epithelial / HPF 09/14/2022 0-5  0 - 5 Final   Mucus 09/14/2022 PRESENT   Final   Performed at [facility], [address] [place], [place] [address]   Glucose-Capillary 09/16/2022 105 (H)  70 - 99 mg/dL Final   Glucose reference range applies only to samples taken after fasting for at least 8 hours.   SARS Coronavirus 2 by RT PCR 09/30/2022 NEGATIVE  NEGATIVE Final   Comment: (NOTE) SARS-CoV-2 target nucleic acids are NOT DETECTED.  The SARS-CoV-2 RNA is generally detectable in upper respiratory specimens during the acute phase of infection. The lowest concentration of SARS-CoV-2 viral copies this assay can detect is 138 copies/mL. A negative result does not preclude SARS-Cov-2 infection and should not be used as the sole basis for treatment or other patient management decisions. A negative result may occur with  improper specimen collection/handling, submission of specimen other than nasopharyngeal swab, presence of viral mutation(s) within the areas targeted by this assay, and inadequate number of viral copies(<138 copies/mL). A negative result must be combined with clinical observations, patient history, and epidemiological information. The  expected result is Negative.  Fact Sheet for Patients:  BloggerCourse.com  Fact Sheet for Healthcare Providers:  SeriousBroker.it  This test is no                          t yet approved or cleared by the [place] FDA and  has been  authorized for detection and/or diagnosis of SARS-CoV-2 by FDA under an Emergency Use Authorization (EUA). This EUA will remain  in effect (meaning this test can be used) for the duration of the COVID-19 declaration under Section 564(b)(1) of the Act, 21 U.S.C.section 360bbb-3(b)(1), unless the authorization is terminated  or revoked sooner.       Influenza A by PCR 09/30/2022 NEGATIVE  NEGATIVE Final   Influenza B by PCR 09/30/2022 NEGATIVE  NEGATIVE Final   Comment: (NOTE) The Xpert Xpress SARS-CoV-2/FLU/RSV plus assay is intended as an aid in the diagnosis of influenza from Nasopharyngeal swab specimens and should not be used as a sole basis for treatment. Nasal washings and aspirates are unacceptable for Xpert Xpress SARS-CoV-2/FLU/RSV testing.  Fact Sheet for Patients: BloggerCourse.com  Fact Sheet for Healthcare Providers: SeriousBroker.it  This test is not yet approved or cleared by the [place] FDA and has been authorized for detection and/or diagnosis of SARS-CoV-2 by FDA under an Emergency Use Authorization (EUA). This EUA will remain in effect (meaning this test can be used) for the duration of the COVID-19 declaration under Section 564(b)(1) of the Act, 21 U.S.C. section 360bbb-3(b)(1), unless the authorization is terminated or revoked.     Resp Syncytial Virus by PCR 09/30/2022 NEGATIVE  NEGATIVE Final   Comment: (NOTE) Fact Sheet for Patients: BloggerCourse.com  Fact Sheet for Healthcare Providers: SeriousBroker.it  This test is not yet approved or cleared by the [place] FDA and has been authorized for detection and/or diagnosis of SARS-CoV-2 by FDA under an Emergency Use Authorization (EUA). This EUA will remain in effect (meaning this test can be used) for the duration of the COVID-19 declaration under Section 564(b)(1) of the Act, 21  U.S.C. section 360bbb-3(b)(1), unless the authorization is terminated or revoked.  Performed at [facility], [address] [place], [place] [address]    Sodium 10/01/2022 136  135 - 145 mmol/L Final   Potassium 10/01/2022 3.8  3.5 - 5.1 mmol/L Final   Chloride 10/01/2022 104  98 - 111 mmol/L Final   CO2 10/01/2022 27  22 - 32 mmol/L Final   Glucose, Bld 10/01/2022 98  70 - 99 mg/dL Final   Glucose reference range applies only to samples taken after fasting for at least 8 hours.   BUN 10/01/2022 12  6 - 20 mg/dL Final   Creatinine, Ser 10/01/2022 0.98  0.44 - 1.00 mg/dL Final   Calcium 09/32/6712 8.9  8.9 - 10.3 mg/dL Final   GFR, Estimated 10/01/2022 >60  >60 mL/min Final   Comment: (NOTE) Calculated using the CKD-EPI Creatinine Equation (2021)    Anion gap 10/01/2022 5  5 - 15 Final   Performed at [facility], [address] [place], [place] [address]   WBC 10/01/2022 5.1  4.0 - 10.5 K/uL Final   RBC 10/01/2022 4.31  3.87 - 5.11 MIL/uL Final   Hemoglobin 10/01/2022 12.7  12.0 - 15.0 g/dL Final   HCT 98/33/8250 38.8  36.0 - 46.0 % Final   MCV 10/01/2022 90.0  80.0 - 100.0 fL Final   MCH 10/01/2022 29.5  26.0 - 34.0 pg Final   MCHC 10/01/2022 32.7  30.0 - 36.0 g/dL Final   RDW 29/92/4268 12.4  11.5 - 15.5 % Final   Platelets 10/01/2022 300  150 - 400 K/uL Final   nRBC 10/01/2022 0.0  0.0 - 0.2 % Final   Performed at [facility], [address], [place], [place] [address]   Lipase 10/01/2022 29  11 - 51 U/L Final   Performed at [facility], [address] [place], [place] [address]   Color, Urine 10/05/2022 YELLOW  YELLOW Final   APPearance 10/05/2022 HAZY (A)  CLEAR Final   Specific Gravity, Urine 10/05/2022 1.018  1.005 - 1.030 Final   pH 10/05/2022 5.0  5.0 - 8.0 Final   Glucose, UA 10/05/2022 NEGATIVE  NEGATIVE mg/dL Final   Hgb urine dipstick 10/05/2022 NEGATIVE  NEGATIVE Final   Bilirubin Urine 10/05/2022 NEGATIVE  NEGATIVE Final    Ketones, ur 10/05/2022 NEGATIVE  NEGATIVE mg/dL Final   Protein, ur 98/92/1194 NEGATIVE  NEGATIVE mg/dL Final   Nitrite 17/40/8144 NEGATIVE  NEGATIVE Final   Leukocytes,Ua 10/05/2022 TRACE (A)  NEGATIVE Final   RBC / HPF 10/05/2022 0-5  0 - 5 RBC/hpf Final   WBC, UA 10/05/2022 0-5  0 - 5 WBC/hpf Final   Bacteria, UA 10/05/2022 RARE (A)  NONE SEEN Final   Squamous Epithelial / HPF 10/05/2022 0-5  0 - 5 Final   Mucus 10/05/2022 PRESENT   Final   Performed at [facility], [address] [place], [place] [address]   Specimen Description 10/05/2022 URINE, CLEAN CATCH   Final   Special Requests 10/05/2022    Final                   Value:NONE Performed at [facility], [address], [place], [place] [address]    Culture 10/05/2022 10,000 COLONIES/mL MULTIPLE SPECIES PRESENT, SUGGEST RECOLLECTION (A)   Final   Report Status 10/05/2022 10/07/2022 FINAL   Final  Admission on 07/04/2022, Discharged on 07/05/2022  Component Date Value Ref Range Status   Sodium 07/04/2022 142  135 - 145 mmol/L Final   Potassium 07/04/2022 4.4  3.5 - 5.1 mmol/L Final   Chloride 07/04/2022 109  98 - 111 mmol/L Final   CO2 07/04/2022 26  22 - 32 mmol/L Final   Glucose, Bld 07/04/2022 96  70 - 99 mg/dL Final   Glucose reference range applies only to samples taken after fasting for at least 8 hours.   BUN 07/04/2022 15  6 - 20 mg/dL Final   Creatinine, Ser 07/04/2022 0.83  0.44 - 1.00 mg/dL Final   Calcium 02/63/7858 9.3  8.9 - 10.3 mg/dL Final   Total Protein 85/11/7739 7.2  6.5 - 8.1 g/dL Final   Albumin 28/78/6767 3.9  3.5 - 5.0 g/dL Final   AST 20/94/7096 23  15 - 41 U/L Final   ALT 07/04/2022 28  0 - 44 U/L Final   Alkaline Phosphatase 07/04/2022 77  38 - 126 U/L Final   Total Bilirubin 07/04/2022 0.4  0.3 - 1.2 mg/dL Final   GFR, Estimated 07/04/2022 >60  >60 mL/min Final   Comment: (NOTE) Calculated using the CKD-EPI Creatinine Equation (2021)    Anion gap 07/04/2022 7  5 - 15 Final    Performed at [facility], [address], [place], [place] [address]   Alcohol, Ethyl (B) 07/04/2022 <10  <10 mg/dL Final   Comment: (  NOTE) Lowest detectable limit for serum alcohol is 10 mg/dL.  For medical purposes only. Performed at [facility], [address] [place], [place] [address]    WBC 07/04/2022 7.0  4.0 - 10.5 K/uL Final   RBC 07/04/2022 4.18  3.87 - 5.11 MIL/uL Final   Hemoglobin 07/04/2022 12.8  12.0 - 15.0 g/dL Final   HCT 60/45/409809/21/2023 39.1  36.0 - 46.0 % Final   MCV 07/04/2022 93.5  80.0 - 100.0 fL Final   MCH 07/04/2022 30.6  26.0 - 34.0 pg Final   MCHC 07/04/2022 32.7  30.0 - 36.0 g/dL Final   RDW 11/91/478209/21/2023 12.9  11.5 - 15.5 % Final   Platelets 07/04/2022 243  150 - 400 K/uL Final   nRBC 07/04/2022 0.0  0.0 - 0.2 % Final   Neutrophils Relative % 07/04/2022 43  % Final   Neutro Abs 07/04/2022 3.0  1.7 - 7.7 K/uL Final   Lymphocytes Relative 07/04/2022 50  % Final   Lymphs Abs 07/04/2022 3.5  0.7 - 4.0 K/uL Final   Monocytes Relative 07/04/2022 7  % Final   Monocytes Absolute 07/04/2022 0.5  0.1 - 1.0 K/uL Final   Eosinophils Relative 07/04/2022 0  % Final   Eosinophils Absolute 07/04/2022 0.0  0.0 - 0.5 K/uL Final   Basophils Relative 07/04/2022 0  % Final   Basophils Absolute 07/04/2022 0.0  0.0 - 0.1 K/uL Final   Immature Granulocytes 07/04/2022 0  % Final   Abs Immature Granulocytes 07/04/2022 0.01  0.00 - 0.07 K/uL Final   Performed at [facility], [address] [place], [place] [address]   I-stat hCG, quantitative 07/04/2022 <5.0  <5 mIU/mL Final   Comment 3 07/04/2022          Final   Comment:   GEST. AGE      CONC.  (mIU/mL)   <=1 WEEK        5 - 50     2 WEEKS       50 - 500     3 WEEKS       100 - 10,000     4 WEEKS     1,000 - 30,000        FEMALE AND NON-PREGNANT FEMALE:     LESS THAN 5 mIU/mL   Admission on 06/14/2022, Discharged on 06/14/2022  Component Date Value Ref Range Status    Color, UA 06/14/2022 yellow  yellow Final   Clarity, UA 06/14/2022 cloudy (A)  clear Final   Glucose, UA 06/14/2022 negative  negative mg/dL Final   Bilirubin, UA 30/86/578409/10/2021 negative  negative Final   Ketones, POC UA 06/14/2022 negative  negative mg/dL Final   Spec Grav, UA 69/62/952809/10/2021 1.025  1.010 - 1.025 Final   Blood, UA 06/14/2022 negative  negative Final   pH, UA 06/14/2022 7.5  5.0 - 8.0 Final   Protein Ur, POC 06/14/2022 =30 (A)  negative mg/dL Final   Urobilinogen, UA 06/14/2022 0.2  0.2 or 1.0 E.U./dL Final   Nitrite, UA 41/32/440109/10/2021 Negative  Negative Final   Leukocytes, UA 06/14/2022 Negative  Negative Final   Preg Test, Ur 06/14/2022 Negative  Negative Final   Specimen Description 06/14/2022 URINE, CLEAN CATCH   Final   Special Requests 06/14/2022    Final                   Value:NONE Performed at [facility], [address] [place], [place] [address]  Culture 06/14/2022 MULTIPLE SPECIES PRESENT, SUGGEST RECOLLECTION (A)   Final   Report Status 06/14/2022 06/16/2022 FINAL   Final  Admission on 06/07/2022, Discharged on 06/10/2022  Component Date Value Ref Range Status   SARS Coronavirus 2 by RT PCR 06/07/2022 NEGATIVE  NEGATIVE Final   Comment: (NOTE) SARS-CoV-2 target nucleic acids are NOT DETECTED.  The SARS-CoV-2 RNA is generally detectable in upper respiratory specimens during the acute phase of infection. The lowest concentration of SARS-CoV-2 viral copies this assay can detect is 138 copies/mL. A negative result does not preclude SARS-Cov-2 infection and should not be used as the sole basis for treatment or other patient management decisions. A negative result may occur with  improper specimen collection/handling, submission of specimen other than nasopharyngeal swab, presence of viral mutation(s) within the areas targeted by this assay, and inadequate number of viral copies(<138 copies/mL). A negative result must be combined with clinical observations,  patient history, and epidemiological information. The expected result is Negative.  Fact Sheet for Patients:  EntrepreneurPulse.com.au  Fact Sheet for Healthcare Providers:  IncredibleEmployment.be  This test is no                          t yet approved or cleared by the [place] FDA and  has been authorized for detection and/or diagnosis of SARS-CoV-2 by FDA under an Emergency Use Authorization (EUA). This EUA will remain  in effect (meaning this test can be used) for the duration of the COVID-19 declaration under Section 564(b)(1) of the Act, 21 U.S.C.section 360bbb-3(b)(1), unless the authorization is terminated  or revoked sooner.       Influenza A by PCR 06/07/2022 NEGATIVE  NEGATIVE Final   Influenza B by PCR 06/07/2022 NEGATIVE  NEGATIVE Final   Comment: (NOTE) The Xpert Xpress SARS-CoV-2/FLU/RSV plus assay is intended as an aid in the diagnosis of influenza from Nasopharyngeal swab specimens and should not be used as a sole basis for treatment. Nasal washings and aspirates are unacceptable for Xpert Xpress SARS-CoV-2/FLU/RSV testing.  Fact Sheet for Patients: EntrepreneurPulse.com.au  Fact Sheet for Healthcare Providers: IncredibleEmployment.be  This test is not yet approved or cleared by the [place] FDA and has been authorized for detection and/or diagnosis of SARS-CoV-2 by FDA under an Emergency Use Authorization (EUA). This EUA will remain in effect (meaning this test can be used) for the duration of the COVID-19 declaration under Section 564(b)(1) of the Act, 21 U.S.C. section 360bbb-3(b)(1), unless the authorization is terminated or revoked.  Performed at [facility], [place] [address] [place], [place] [address]    WBC 06/07/2022 5.7  4.0 - 10.5 K/uL Final   RBC 06/07/2022 4.39  3.87 - 5.11 MIL/uL Final   Hemoglobin 06/07/2022 13.2  12.0 - 15.0 g/dL Final   HCT  06/07/2022 40.4  36.0 - 46.0 % Final   MCV 06/07/2022 92.0  80.0 - 100.0 fL Final   MCH 06/07/2022 30.1  26.0 - 34.0 pg Final   MCHC 06/07/2022 32.7  30.0 - 36.0 g/dL Final   RDW 06/07/2022 12.4  11.5 - 15.5 % Final   Platelets 06/07/2022 308  150 - 400 K/uL Final   nRBC 06/07/2022 0.0  0.0 - 0.2 % Final   Neutrophils Relative % 06/07/2022 42  % Final   Neutro Abs 06/07/2022 2.4  1.7 - 7.7 K/uL Final   Lymphocytes Relative 06/07/2022 54  % Final   Lymphs Abs 06/07/2022 3.1  0.7 -  4.0 K/uL Final   Monocytes Relative 06/07/2022 4  % Final   Monocytes Absolute 06/07/2022 0.2  0.1 - 1.0 K/uL Final   Eosinophils Relative 06/07/2022 0  % Final   Eosinophils Absolute 06/07/2022 0.0  0.0 - 0.5 K/uL Final   Basophils Relative 06/07/2022 0  % Final   Basophils Absolute 06/07/2022 0.0  0.0 - 0.1 K/uL Final   Immature Granulocytes 06/07/2022 0  % Final   Abs Immature Granulocytes 06/07/2022 0.01  0.00 - 0.07 K/uL Final   Performed at [facility], [address], [place], [place] [address]   Sodium 06/07/2022 139  135 - 145 mmol/L Final   Potassium 06/07/2022 4.0  3.5 - 5.1 mmol/L Final   Chloride 06/07/2022 104  98 - 111 mmol/L Final   CO2 06/07/2022 28  22 - 32 mmol/L Final   Glucose, Bld 06/07/2022 104 (H)  70 - 99 mg/dL Final   Glucose reference range applies only to samples taken after fasting for at least 8 hours.   BUN 06/07/2022 8  6 - 20 mg/dL Final   Creatinine, Ser 06/07/2022 0.84  0.44 - 1.00 mg/dL Final   Calcium 60/45/4098 9.1  8.9 - 10.3 mg/dL Final   Total Protein 11/91/4782 6.9  6.5 - 8.1 g/dL Final   Albumin 95/62/1308 3.7  3.5 - 5.0 g/dL Final   AST 65/78/4696 19  15 - 41 U/L Final   ALT 06/07/2022 22  0 - 44 U/L Final   Alkaline Phosphatase 06/07/2022 54  38 - 126 U/L Final   Total Bilirubin 06/07/2022 0.4  0.3 - 1.2 mg/dL Final   GFR, Estimated 06/07/2022 >60  >60 mL/min Final   Comment: (NOTE) Calculated using the CKD-EPI Creatinine Equation (2021)    Anion gap  06/07/2022 7  5 - 15 Final   Performed at [facility], [address], [place], [place] [address]   Hgb A1c MFr Bld 06/07/2022 5.0  4.8 - 5.6 % Final   Comment: (NOTE) Pre diabetes:          5.7%-6.4%  Diabetes:              >6.4%  Glycemic control for   <7.0% adults with diabetes    Mean Plasma Glucose 06/07/2022 96.8  mg/dL Final   Performed at [facility], [address] [place], [place] [address]   TSH 06/07/2022 1.620  0.350 - 4.500 uIU/mL Final   Comment: Performed by a 3rd Generation assay with a functional sensitivity of <=0.01 uIU/mL. Performed at [facility], [address] [place], [place] [address]    RPR Ser Ql 06/07/2022 NON REACTIVE  NON REACTIVE Final   Performed at [facility], [address], [place], [place] [address]   Color, Urine 06/07/2022 YELLOW  YELLOW Final   APPearance 06/07/2022 HAZY (A)  CLEAR Final   Specific Gravity, Urine 06/07/2022 1.018  1.005 - 1.030 Final   pH 06/07/2022 7.0  5.0 - 8.0 Final   Glucose, UA 06/07/2022 NEGATIVE  NEGATIVE mg/dL Final   Hgb urine dipstick 06/07/2022 NEGATIVE  NEGATIVE Final   Bilirubin Urine 06/07/2022 NEGATIVE  NEGATIVE Final   Ketones, ur 06/07/2022 NEGATIVE  NEGATIVE mg/dL Final   Protein, ur 64/40/3474 NEGATIVE  NEGATIVE mg/dL Final   Nitrite 25/95/6387 NEGATIVE  NEGATIVE Final   Leukocytes,Ua 06/07/2022 NEGATIVE  NEGATIVE Final   Performed at [facility], [address] [place], [place] [address]  Cholesterol 06/07/2022 164  0 - 200 mg/dL Final   Triglycerides 29/56/213008/25/2023 158 (H)  <150 mg/dL Final   HDL 86/57/846908/25/2023 44  >40 mg/dL Final   Total CHOL/HDL Ratio 06/07/2022 3.7  RATIO Final   VLDL 06/07/2022 32  0 - 40 mg/dL Final   LDL Cholesterol 06/07/2022 88  0 - 99 mg/dL Final   Comment:        Total Cholesterol/HDL:CHD Risk Coronary Heart Disease Risk Table                     Men   Women  1/2 Average Risk   3.4   3.3  Average Risk       5.0   4.4  2 X Average Risk    9.6   7.1  3 X Average Risk  23.4   11.0        Use the calculated Patient Ratio above and the CHD Risk Table to determine the patient's CHD Risk.        ATP III CLASSIFICATION (LDL):  <100     mg/dL   Optimal  [phone]  mg/dL   Near or Above                    Optimal  130-159  mg/dL   Borderline  [phone]  mg/dL   High  >010>190     mg/dL   Very High Performed at [facility], [address] [place], [place] [address]    HIV Screen 4th Generation wRfx 06/07/2022 Non Reactive  Non Reactive Final   Performed at [facility], [address] [place], [place] [address]   SARSCOV2ONAVIRUS 2 AG 06/07/2022 NEGATIVE  NEGATIVE Final   Comment: (NOTE) SARS-CoV-2 antigen NOT DETECTED.   Negative results are presumptive.  Negative results do not preclude SARS-CoV-2 infection and should not be used as the sole basis for treatment or other patient management decisions, including infection  control decisions, particularly in the presence of clinical signs and  symptoms consistent with COVID-19, or in those who have been in contact with the virus.  Negative results must be combined with clinical observations, patient history, and epidemiological information. The expected result is Negative.  Fact Sheet for Patients: https://www.jennings-kim.com/https://www.fda.gov/media/141569/download  Fact Sheet for Healthcare Providers: https://alexander-rogers.biz/https://www.fda.gov/media/141568/download  This test is not yet approved or cleared by the [place] FDA and  has been authorized for detection and/or diagnosis of SARS-CoV-2 by FDA under an Emergency Use Authorization (EUA).  This EUA will remain in effect (meaning this test can be used) for the duration of  the COV                          ID-19 declaration under Section 564(b)(1) of the Act, 21 U.S.C. section 360bbb-3(b)(1), unless the authorization is terminated or revoked sooner.     POC Amphetamine UR 06/07/2022 None Detected  NONE DETECTED (Cut Off Level 1000 ng/mL) Final    POC Secobarbital (BAR) 06/07/2022 None Detected  NONE DETECTED (Cut Off Level 300 ng/mL) Final   POC Buprenorphine (BUP) 06/07/2022 None Detected  NONE DETECTED (Cut Off Level 10 ng/mL) Final   POC Oxazepam (BZO) 06/07/2022 None Detected  NONE DETECTED (Cut Off Level 300 ng/mL) Final   POC Cocaine UR 06/07/2022 None Detected  NONE DETECTED (Cut Off Level 300 ng/mL) Final   POC Methamphetamine UR 06/07/2022 None Detected  NONE DETECTED (Cut Off Level 1000  ng/mL) Final   POC Morphine 06/07/2022 None Detected  NONE DETECTED (Cut Off Level 300 ng/mL) Final   POC Methadone UR 06/07/2022 None Detected  NONE DETECTED (Cut Off Level 300 ng/mL) Final   POC Oxycodone UR 06/07/2022 None Detected  NONE DETECTED (Cut Off Level 100 ng/mL) Final   POC Marijuana UR 06/07/2022 None Detected  NONE DETECTED (Cut Off Level 50 ng/mL) Final   Preg Test, Ur 06/08/2022 NEGATIVE  NEGATIVE Final   Comment:        THE SENSITIVITY OF THIS METHODOLOGY IS >20 mIU/mL. Performed at [facility], [address] [place], [place] [address]    Preg Test, Ur 06/08/2022 NEGATIVE  NEGATIVE Final   Comment:        THE SENSITIVITY OF THIS METHODOLOGY IS >24 mIU/mL     Blood Alcohol level:  Lab Results  Component Value Date   ETH <10 07/04/2022   ETH <10 02/07/2022    Metabolic Disorder Labs: Lab Results  Component Value Date   HGBA1C 5.7 (H) 10/15/2022   MPG 117 10/15/2022   MPG 96.8 06/07/2022   No results found for: "PROLACTIN" Lab Results  Component Value Date   CHOL 181 07/25/2022   TRIG 118 07/25/2022   HDL 45 07/25/2022   CHOLHDL 4.0 07/25/2022   VLDL 24 07/25/2022   LDLCALC 112 (H) 07/25/2022   LDLCALC 88 06/07/2022    Therapeutic Lab Levels: No results found for: "LITHIUM" No results found for: "VALPROATE" No results found for: "CBMZ"  Physical Findings   GAD-7    Flowsheet Row [facility]  Total GAD-7 Score 17       PHQ2-9    Flowsheet Row [facility]  PHQ-2 Total Score 0 1 2  PHQ-9 Total Score -- 2 8      Flowsheet Row [facility] Most recent reading at 11/07/2022  9:32 AM [facility] Most recent reading at 10/07/2022 12:18 PM [facility] Most recent reading at 10/06/2022  9:38 AM  C-SSRS RISK CATEGORY No Risk No Risk No Risk        Musculoskeletal  Strength & Muscle Tone: within normal limits Gait & Station: normal Patient leans: N/A  Psychiatric Specialty Exam  Presentation  General Appearance:  Appropriate for Environment  Eye Contact: Good  Speech: Clear and Coherent  Speech Volume: Normal  Handedness: Right   Mood and Affect  Mood: Euthymic  Affect: Congruent   Thought Process  Thought Processes: Coherent; Goal Directed  Descriptions of Associations:Intact  Orientation:Full (Time, Place and Person)  Thought Content:Logical  Diagnosis of Schizophrenia or Schizoaffective disorder in past: Yes  Duration of Psychotic Symptoms: Greater than six months   Hallucinations:Hallucinations: None  Ideas of Reference:None  Suicidal Thoughts:Suicidal Thoughts: No  Homicidal Thoughts:Homicidal Thoughts: No   Sensorium  Memory: Immediate Good; Recent Good; Remote Good  Judgment: Intact  Insight: Present   Executive Functions  Concentration: Good  Attention Span: Good  Recall: Good  Fund of Knowledge: Good  Language: Good   Psychomotor Activity  Psychomotor Activity: Psychomotor Activity: Normal   Assets  Assets: Communication Skills; Desire for Improvement; Leisure  Time;  Resilience; Physical Health; Social Support   Sleep  Sleep: Sleep: Good   Nutritional Assessment (For OBS and [facility] admissions only) Has the patient had a weight loss or gain of 10 pounds or more in the last 3 months?: No Has the patient had a decrease in food intake/or appetite?: No Does the patient have dental problems?: No Does the patient have eating habits or behaviors that may be indicators of an eating disorder including binging or inducing vomiting?: No Has the patient recently lost weight without trying?: 0 Has the patient been eating poorly because of a decreased appetite?: 0 Malnutrition Screening Tool Score: 0   Physical Exam  Physical Exam Vitals and nursing note reviewed.  Constitutional:      General: She is not in acute distress.    Appearance: Normal appearance. She is not ill-appearing.  HENT:     Head: Normocephalic.  Eyes:     General:        Right eye: No discharge.        Left eye: No discharge.     Conjunctiva/sclera: Conjunctivae normal.  Cardiovascular:     Rate and Rhythm: Normal rate.  Pulmonary:     Effort: Pulmonary effort is normal.  Musculoskeletal:        General: Normal range of motion.     Cervical back: Normal range of motion.  Skin:    Coloration: Skin is not jaundiced or pale.  Neurological:     Mental Status: She is alert and oriented to person, place, and time.  Psychiatric:        Attention and Perception: Attention and perception normal. She does not perceive auditory or visual hallucinations.        Mood and Affect: Mood and affect normal.        Speech: Speech normal.        Behavior: Behavior normal. Behavior is cooperative.        Thought Content: Thought content normal. Thought content is not paranoid or delusional. Thought content does not include homicidal or suicidal ideation.        Cognition and Memory: Cognition normal.        Judgment: Judgment normal.    Review of Systems  Constitutional:  Negative for chills and  fever.  HENT:  Negative for sinus pain and sore throat.   Respiratory:  Negative for cough, shortness of breath and wheezing.   Cardiovascular:  Negative for chest pain and palpitations.  Gastrointestinal:  Negative for abdominal pain, constipation, diarrhea and nausea.  Genitourinary:  Negative for dysuria, flank pain and urgency.  Musculoskeletal: Negative.   Neurological: Negative.   Psychiatric/Behavioral: Negative.  Depression: Stable. Hallucinations: Denies. Suicidal ideas: Denies. The patient does not have insomnia. Nervous/anxious: Stable.  All other systems reviewed and are negative.  Blood pressure 95/63, pulse 63, temperature 98.4 F (36.9 C), resp. rate 16, height 5\' 1"  (1.549 m), weight 198 lb (89.8 kg), SpO2 100 %. Body mass index is 37.41 kg/m.  Treatment Plan Summary:  Patient remains psychiatrically cleared awaiting appropriate placement.  Social work continue to work with [place] and other agencies to find appropriate placement for patient.    Will continue daily assessments to evaluate progress and any symptoms needing to be addressed, and medication management.    Aidin Doane, NP 11/18/2022 9:44 AM

## 2022-11-18 NOTE — ED Notes (Signed)
In a meeting

## 2022-11-18 NOTE — ED Notes (Signed)
Patient watching TV. Respirations equal and unlabored, skin warm and dry, NAD. Routine safety checks conducted according to facility protocol. Will continue to monitor for safety.  

## 2022-11-18 NOTE — ED Notes (Signed)
Pt comfortable at this hour. No apparent distress. RR even and unlabored. Monitored for safety.

## 2022-11-18 NOTE — ED Notes (Signed)
Pt sleeping in recliner chair. RR even and unlabored. Will continue to monitor for safety

## 2022-11-18 NOTE — ED Notes (Signed)
[name]&Ox4. Patient denies SI/HI and AVH. Patient denies any physical complaints when asked. No acute distress noted. Support and encouragement provided. Routine safety checks conducted according to facility protocol. Encouraged patient to notify staff if thoughts of harm toward self or others arise. Patient verbalize understanding and agreement. Will continue to monitor for safety.    

## 2022-11-18 NOTE — ED Notes (Signed)
Patient is in a meeting

## 2022-11-19 DIAGNOSIS — F209 Schizophrenia, unspecified: Secondary | ICD-10-CM | POA: Diagnosis not present

## 2022-11-19 DIAGNOSIS — F419 Anxiety disorder, unspecified: Secondary | ICD-10-CM | POA: Diagnosis not present

## 2022-11-19 DIAGNOSIS — E118 Type 2 diabetes mellitus with unspecified complications: Secondary | ICD-10-CM | POA: Diagnosis not present

## 2022-11-19 DIAGNOSIS — E039 Hypothyroidism, unspecified: Secondary | ICD-10-CM | POA: Diagnosis not present

## 2022-11-19 DIAGNOSIS — F17200 Nicotine dependence, unspecified, uncomplicated: Secondary | ICD-10-CM | POA: Diagnosis not present

## 2022-11-19 DIAGNOSIS — N390 Urinary tract infection, site not specified: Secondary | ICD-10-CM | POA: Diagnosis not present

## 2022-11-19 DIAGNOSIS — Z1152 Encounter for screening for COVID-19: Secondary | ICD-10-CM | POA: Diagnosis not present

## 2022-11-19 DIAGNOSIS — N9489 Other specified conditions associated with female genital organs and menstrual cycle: Secondary | ICD-10-CM | POA: Diagnosis not present

## 2022-11-19 DIAGNOSIS — F333 Major depressive disorder, recurrent, severe with psychotic symptoms: Secondary | ICD-10-CM | POA: Diagnosis not present

## 2022-11-19 NOTE — ED Notes (Signed)
Pt laying in her bed with her eyes open calm and cooperative. No c/o pain or distress. Will continue to monitor for safety

## 2022-11-19 NOTE — Care Management (Signed)
OBS Care Management   Writer spoke to [facility] Feliberto Gottron [phone]) who is the owner of the [place].  Per Beth there are immediate openings in the program.  This program has rooms for rent at [facility] in [place].  A private bedroom is $750 monthly, and a shared bedroom is $600 monthly with utilities included.  Writer arranged a meeting with the patient and the owner Ellin Mayhew) for Wednesday 11-20-2022 at [place] left a voicemail message for the patient legal guardian Marcy Salvo [phone]) regarding the patient being safely discharged to Ellin Mayhew until her apartment with [facility] has been approved.  Writer contacted [place] ([place] [phone]) and informed her that the patient would have a meeting tomorrow with Ellin Mayhew for a possible potential placement.

## 2022-11-19 NOTE — ED Notes (Signed)
Pt sleeping@this time. Breathing even and unlabored. Will continue to monitor for safety 

## 2022-11-19 NOTE — ED Notes (Signed)
SW in at this time to eval pt.

## 2022-11-19 NOTE — ED Provider Notes (Signed)
Behavioral Health Progress Note  Date and Time: 11/19/2022 10:34 PM Name: Nicole [name] MRN:  [id number]  HPI:  Nicole [name] is a 32 y/o female with PMH of SCZ unspecified, MDD with psychosis, who presented voluntarily to [facility] (07/23/2022) via [facility] after a verbal altercation with Nicole [name] (not patient's legal guardian) for transient SI. This is patient's fourth visit to [facility] for similar concerns this year. Patient has been dismissed from previous group home due to threatening SI and HI, then leaving the group home. 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    Patient seen face-to-face by this provider and chart reviewed on 11/19/2022  Subjective:    On today's reevaluation patient is laying in her bed awake.  She is in no acute distress.  She is frustrated due to her recent IDD testing.  States, "they say that testing didn't count".  She has spent time on the phone today  trying to call [facility] to arrange her own IDD testing.  She is frustrated because she is ready to be out from this facility.  She still continues to have a euthymic affect and she is pleasant.  She smiles throughout the assessment.  She continues to deny SI/HI/AVH.  She does not appear to be responding to internal/external stimuli.  She asked, "I just do not know how I am going to be able to get out of here ".  Reassurance and support was provided.  Per [name] Elisabeth Most note Care management on 2/6/204   "Writer spoke to [facility] Jennette Banker [phone]) who is the owner of the Transitional Housing Program.  Per Beth there are immediate openings in the program.   This program has rooms for rent at [facility] in [place].  A private bedroom is $750 monthly, and a shared bedroom is $600 monthly with utilities included.   Writer arranged a meeting with the patient and the owner Nicole [name]) for Wednesday 11-20-2022 at 10am.   Writer left a voicemail message for the patient legal guardian Nicole [name] [phone]) regarding the patient being safely discharged to Nicole [name] until her apartment with Vesta Mixer has been approved.   Writer contacted [facility] IDD Care Manager (Nicole [name] [phone]) and informed her that the patient would have a meeting tomorrow with Nicole [name] for a possible potential placement."  gnosis:  Final diagnoses:  Tobacco use disorder  Schizophrenia, unspecified type (HCC)  MDD (major depressive disorder), recurrent episode, moderate (HCC)  Hypothyroidism, unspecified type    Total Time spent with patient: 30 minutes  Past Psychiatric History: as documented in the H&P Past Medical History: as documented in the H&P Family History: as documented in the H&P Family Psychiatric  History: as documented in the H&P Social History: as documented in the H&P  Additional Social History: as documented in the H&P                      Sleep: Good  Appetite:  Good  Current Medications:  Current Facility-Administered Medications  Medication Dose Route Frequency Provider Last Rate Last Admin   acetaminophen (TYLENOL) tablet 650 mg  650 mg Oral Q6H PRN Marlou Sa, NP   650 mg at 11/18/22 2114   alum & mag hydroxide-simeth (MAALOX/MYLANTA) 200-200-20 MG/5ML suspension 30 mL  30 mL Oral Q4H PRN Marlou Sa, NP   30 mL at 11/16/22 1849   ARIPiprazole ER (ABILIFY MAINTENA) 400 MG prefilled syringe 400 mg  400 mg Intramuscular Q28 days Cyndie Chime,  Raynelle Fanning, DO   400 mg at 10/25/22 0920   cetaphil lotion   Topical PRN Princess Bruins, DO       fluticasone [facility]) 50 MCG/ACT nasal spray 1 spray  1 spray Each Nare QHS Lauree Chandler, NP   1 spray at 11/19/22 2150   hydrocortisone cream 1 %   Topical QID PRN Onuoha, Chinwendu V, NP   Given at 11/14/22 0981   hydrOXYzine (ATARAX) tablet 25 mg  25 mg Oral TID PRN Lorri Frederick, MD   25 mg at 11/19/22 2152   ketoconazole (NIZORAL) 2 % cream   Topical BID Princess Bruins, DO   1 Application  at 11/19/22 1018   levothyroxine (SYNTHROID) tablet 100 mcg  100 mcg Oral Once Marlou Sa, NP       levothyroxine (SYNTHROID) tablet 100 mcg  100 mcg Oral Q0600 Marlou Sa, NP   100 mcg at 11/19/22 0615   lidocaine (LIDODERM) 5 % 1 patch  1 patch Transdermal Q24H Princess Bruins, DO   1 patch at 11/19/22 1017   loratadine (CLARITIN) tablet 10 mg  10 mg Oral Daily Carrion-Carrero, Karle Starch, MD   10 mg at 11/19/22 1012   magnesium hydroxide (MILK OF MAGNESIA) suspension 30 mL  30 mL Oral Daily PRN Marlou Sa, NP   30 mL at 10/12/22 2145   metFORMIN (GLUCOPHAGE) tablet 500 mg  500 mg Oral Q breakfast Carrion-Carrero, Karle Starch, MD   500 mg at 11/19/22 1011   nicotine (NICODERM CQ - dosed in mg/24 hours) patch 14 mg  14 mg Transdermal Daily PRN Carrion-Carrero, Karle Starch, MD       ondansetron (ZOFRAN-ODT) disintegrating tablet 4 mg  4 mg Oral Q8H PRN Carrion-Carrero, Margely, MD   4 mg at 11/16/22 1849   Oxcarbazepine (TRILEPTAL) tablet 300 mg  300 mg Oral BID Byungura, Veronique M, NP   300 mg at 11/19/22 2151   pantoprazole (PROTONIX) EC tablet 40 mg  40 mg Oral Daily Carrion-Carrero, Margely, MD   40 mg at 11/19/22 1011   polyethylene glycol (MIRALAX / GLYCOLAX) packet 17 g  17 g Oral Daily Princess Bruins, DO   17 g at 11/19/22 1014   QUEtiapine (SEROQUEL) tablet 400 mg  400 mg Oral BID Byungura, Veronique M, NP   400 mg at 11/19/22 2151   sertraline (ZOLOFT) tablet 150 mg  150 mg Oral Daily Carrion-Carrero, Margely, MD   150 mg at 11/19/22 1011   sodium chloride (OCEAN) 0.65 % nasal spray 1 spray  1 spray Each Nare Daily Princess Bruins, DO   1 spray at 11/18/22 0916   traZODone (DESYREL) tablet 100 mg  100 mg Oral QHS Olin Pia M, NP   100 mg at 11/19/22 2152   traZODone (DESYREL) tablet 50 mg  50 mg Oral QHS PRN Marlou Sa, NP   50 mg at 11/19/22 2151   valACYclovir (VALTREX) tablet 500 mg  500 mg Oral Daily Carrion-Carrero, Karle Starch, MD   500 mg at  11/19/22 1011   zinc oxide (BALMEX) 11.3 % cream   Topical PRN Princess Bruins, DO       Current Outpatient Medications  Medication Sig Dispense Refill   ABILIFY MAINTENA 400 MG PRSY prefilled syringe 400 mg every 28 (twenty-eight) days.     cetirizine (ZYRTEC) 10 MG tablet Take 10 mg by mouth daily.     cyclobenzaprine (FLEXERIL) 10 MG tablet Take 1 tablet (10 mg total) by mouth 2 (two) times daily as needed  for muscle spasms. 20 tablet 0   fluticasone (FLONASE) 50 MCG/ACT nasal spray Place 1 spray into both nostrils daily.     meloxicam (MOBIC) 15 MG tablet Take 15 mg by mouth daily.     nitrofurantoin, macrocrystal-monohydrate, (MACROBID) 100 MG capsule Take 1 capsule (100 mg total) by mouth 2 (two) times daily. 10 capsule 0   ondansetron (ZOFRAN-ODT) 4 MG disintegrating tablet Take 1 tablet (4 mg total) by mouth every 8 (eight) hours as needed for nausea or vomiting. 20 tablet 0   Oxcarbazepine (TRILEPTAL) 300 MG tablet Take 1 tablet (300 mg total) by mouth 2 (two) times daily. 60 tablet 0   QUEtiapine (SEROQUEL) 400 MG tablet Take 1 tablet (400 mg total) by mouth 2 (two) times daily. 60 tablet 0   sertraline (ZOLOFT) 50 MG tablet Take 3 tablets (150 mg total) by mouth in the morning. 90 tablet 0   traZODone (DESYREL) 100 MG tablet Take 1 tablet (100 mg total) by mouth at bedtime. 30 tablet 0   valACYclovir (VALTREX) 500 MG tablet Take 500 mg by mouth daily.      Labs  Lab Results:  Admission on 10/07/2022  Component Date Value Ref Range Status   Hgb A1c MFr Bld 10/15/2022 5.7 (H)  4.8 - 5.6 % Final   Comment: (NOTE)         Prediabetes: 5.7 - 6.4         Diabetes: >6.4         Glycemic control for adults with diabetes: <7.0    Mean Plasma Glucose 10/15/2022 117  mg/dL Final   Comment: (NOTE) Performed At: [facility] [address] [place], [place] [id number] Jolene Schimke MD WU:[id number]    Color, Urine 11/13/2022 YELLOW  YELLOW Final   APPearance 11/13/2022 CLOUDY  (A)  CLEAR Final   Specific Gravity, Urine 11/13/2022 1.023  1.005 - 1.030 Final   pH 11/13/2022 6.0  5.0 - 8.0 Final   Glucose, UA 11/13/2022 NEGATIVE  NEGATIVE mg/dL Final   Hgb urine dipstick 11/13/2022 NEGATIVE  NEGATIVE Final   Bilirubin Urine 11/13/2022 NEGATIVE  NEGATIVE Final   Ketones, ur 11/13/2022 NEGATIVE  NEGATIVE mg/dL Final   Protein, ur 95/62/1308 NEGATIVE  NEGATIVE mg/dL Final   Nitrite 65/78/4696 NEGATIVE  NEGATIVE Final   Leukocytes,Ua 11/13/2022 NEGATIVE  NEGATIVE Final   Performed at [facility], [address] [place], [place] [address]  Office Visit on 10/21/2022  Component Date Value Ref Range Status   SARSCOV2ONAVIRUS 2 AG 11/01/2022 NEGATIVE  NEGATIVE Final   Comment: (NOTE) SARS-CoV-2 antigen NOT DETECTED.   Negative results are presumptive.  Negative results do not preclude SARS-CoV-2 infection and should not be used as the sole basis for treatment or other patient management decisions, including infection  control decisions, particularly in the presence of clinical signs and  symptoms consistent with COVID-19, or in those who have been in contact with the virus.  Negative results must be combined with clinical observations, patient history, and epidemiological information. The expected result is Negative.  Fact Sheet for Patients: https://www.jennings-kim.com/  Fact Sheet for Healthcare Providers: https://alexander-rogers.biz/  This test is not yet approved or cleared by the [place] FDA and  has been authorized for detection and/or diagnosis of SARS-CoV-2 by FDA under an Emergency Use Authorization (EUA).  This EUA will remain in effect (meaning this test can be used) for the duration of  the COV  ID-19 declaration under Section 564(b)(1) of the Act, 21 U.S.C. section 360bbb-3(b)(1), unless the authorization is terminated or revoked sooner.    Admission on 10/07/2022, Discharged on  10/07/2022  Component Date Value Ref Range Status   Color, Urine 10/07/2022 YELLOW  YELLOW Final   APPearance 10/07/2022 HAZY (A)  CLEAR Final   Specific Gravity, Urine 10/07/2022 1.011  1.005 - 1.030 Final   pH 10/07/2022 7.0  5.0 - 8.0 Final   Glucose, UA 10/07/2022 NEGATIVE  NEGATIVE mg/dL Final   Hgb urine dipstick 10/07/2022 SMALL (A)  NEGATIVE Final   Bilirubin Urine 10/07/2022 NEGATIVE  NEGATIVE Final   Ketones, ur 10/07/2022 NEGATIVE  NEGATIVE mg/dL Final   Protein, ur 16/07/9603 NEGATIVE  NEGATIVE mg/dL Final   Nitrite 54/06/8118 NEGATIVE  NEGATIVE Final   Leukocytes,Ua 10/07/2022 LARGE (A)  NEGATIVE Final   RBC / HPF 10/07/2022 0-5  0 - 5 RBC/hpf Final   WBC, UA 10/07/2022 0-5  0 - 5 WBC/hpf Final   Bacteria, UA 10/07/2022 FEW (A)  NONE SEEN Final   Squamous Epithelial / HPF 10/07/2022 11-20  0 - 5 Final   Performed at [facility], [address] [place], [place] [address]   I-stat hCG, quantitative 10/07/2022 <5.0  <5 mIU/mL Final   Comment 3 10/07/2022          Final   Comment:   GEST. AGE      CONC.  (mIU/mL)   <=1 WEEK        5 - 50     2 WEEKS       50 - 500     3 WEEKS       100 - 10,000     4 WEEKS     1,000 - 30,000        FEMALE AND NON-PREGNANT FEMALE:     LESS THAN 5 mIU/mL    Sodium 10/07/2022 138  135 - 145 mmol/L Final   Potassium 10/07/2022 4.3  3.5 - 5.1 mmol/L Final   Chloride 10/07/2022 104  98 - 111 mmol/L Final   CO2 10/07/2022 28  22 - 32 mmol/L Final   Glucose, Bld 10/07/2022 90  70 - 99 mg/dL Final   Glucose reference range applies only to samples taken after fasting for at least 8 hours.   BUN 10/07/2022 8  6 - 20 mg/dL Final   Creatinine, Ser 10/07/2022 0.96  0.44 - 1.00 mg/dL Final   Calcium 95/62/1308 9.1  8.9 - 10.3 mg/dL Final   GFR, Estimated 10/07/2022 >60  >60 mL/min Final   Comment: (NOTE) Calculated using the CKD-EPI Creatinine Equation (2021)    Anion gap 10/07/2022 6  5 - 15 Final   Performed at [facility],  [address], [place], [place] [address]   WBC 10/07/2022 5.8  4.0 - 10.5 K/uL Final   RBC 10/07/2022 4.36  3.87 - 5.11 MIL/uL Final   Hemoglobin 10/07/2022 12.9  12.0 - 15.0 g/dL Final   HCT 69/62/9528 40.0  36.0 - 46.0 % Final   MCV 10/07/2022 91.7  80.0 - 100.0 fL Final   MCH 10/07/2022 29.6  26.0 - 34.0 pg Final   MCHC 10/07/2022 32.3  30.0 - 36.0 g/dL Final   RDW 41/32/4401 12.4  11.5 - 15.5 % Final   Platelets 10/07/2022 295  150 - 400 K/uL Final   nRBC 10/07/2022 0.0  0.0 - 0.2 % Final   Performed at [facility],  [address] [place], [place] [address]   SARS Coronavirus 2 by RT PCR 10/07/2022 NEGATIVE  NEGATIVE Final   Comment: (NOTE) SARS-CoV-2 target nucleic acids are NOT DETECTED.  The SARS-CoV-2 RNA is generally detectable in upper respiratory specimens during the acute phase of infection. The lowest concentration of SARS-CoV-2 viral copies this assay can detect is 138 copies/mL. A negative result does not preclude SARS-Cov-2 infection and should not be used as the sole basis for treatment or other patient management decisions. A negative result may occur with  improper specimen collection/handling, submission of specimen other than nasopharyngeal swab, presence of viral mutation(s) within the areas targeted by this assay, and inadequate number of viral copies(<138 copies/mL). A negative result must be combined with clinical observations, patient history, and epidemiological information. The expected result is Negative.  Fact Sheet for Patients:  BloggerCourse.comhttps://www.fda.gov/media/152166/download  Fact Sheet for Healthcare Providers:  SeriousBroker.ithttps://www.fda.gov/media/152162/download  This test is no                          t yet approved or cleared by the [place] FDA and  has been authorized for detection and/or diagnosis of SARS-CoV-2 by FDA under an Emergency Use Authorization (EUA). This EUA will remain  in effect (meaning this test can be used) for the duration of  the COVID-19 declaration under Section 564(b)(1) of the Act, 21 U.S.C.section 360bbb-3(b)(1), unless the authorization is terminated  or revoked sooner.       Influenza A by PCR 10/07/2022 NEGATIVE  NEGATIVE Final   Influenza B by PCR 10/07/2022 NEGATIVE  NEGATIVE Final   Comment: (NOTE) The Xpert Xpress SARS-CoV-2/FLU/RSV plus assay is intended as an aid in the diagnosis of influenza from Nasopharyngeal swab specimens and should not be used as a sole basis for treatment. Nasal washings and aspirates are unacceptable for Xpert Xpress SARS-CoV-2/FLU/RSV testing.  Fact Sheet for Patients: BloggerCourse.comhttps://www.fda.gov/media/152166/download  Fact Sheet for Healthcare Providers: SeriousBroker.ithttps://www.fda.gov/media/152162/download  This test is not yet approved or cleared by the [place] FDA and has been authorized for detection and/or diagnosis of SARS-CoV-2 by FDA under an Emergency Use Authorization (EUA). This EUA will remain in effect (meaning this test can be used) for the duration of the COVID-19 declaration under Section 564(b)(1) of the Act, 21 U.S.C. section 360bbb-3(b)(1), unless the authorization is terminated or revoked.     Resp Syncytial Virus by PCR 10/07/2022 NEGATIVE  NEGATIVE Final   Comment: (NOTE) Fact Sheet for Patients: BloggerCourse.comhttps://www.fda.gov/media/152166/download  Fact Sheet for Healthcare Providers: SeriousBroker.ithttps://www.fda.gov/media/152162/download  This test is not yet approved or cleared by the [place] FDA and has been authorized for detection and/or diagnosis of SARS-CoV-2 by FDA under an Emergency Use Authorization (EUA). This EUA will remain in effect (meaning this test can be used) for the duration of the COVID-19 declaration under Section 564(b)(1) of the Act, 21 U.S.C. section 360bbb-3(b)(1), unless the authorization is terminated or revoked.  Performed at [facility], [address] [place], [place] [address]    Specimen Description 10/07/2022 URINE,  CLEAN CATCH   Final   Special Requests 10/07/2022    Final                   Value:NONE Performed at [facility], [address] [place], [place] [address]    Culture 10/07/2022 MULTIPLE SPECIES PRESENT, SUGGEST RECOLLECTION (A)   Final   Report Status 10/07/2022 10/08/2022 FINAL   Final  Admission on 07/25/2022,  Discharged on 10/07/2022  Component Date Value Ref Range Status   SARS Coronavirus 2 by RT PCR 07/25/2022 NEGATIVE  NEGATIVE Final   Comment: (NOTE) SARS-CoV-2 target nucleic acids are NOT DETECTED.  The SARS-CoV-2 RNA is generally detectable in upper respiratory specimens during the acute phase of infection. The lowest concentration of SARS-CoV-2 viral copies this assay can detect is 138 copies/mL. A negative result does not preclude SARS-Cov-2 infection and should not be used as the sole basis for treatment or other patient management decisions. A negative result may occur with  improper specimen collection/handling, submission of specimen other than nasopharyngeal swab, presence of viral mutation(s) within the areas targeted by this assay, and inadequate number of viral copies(<138 copies/mL). A negative result must be combined with clinical observations, patient history, and epidemiological information. The expected result is Negative.  Fact Sheet for Patients:  BloggerCourse.comhttps://www.fda.gov/media/152166/download  Fact Sheet for Healthcare Providers:  SeriousBroker.ithttps://www.fda.gov/media/152162/download  This test is no                          t yet approved or cleared by the [place] FDA and  has been authorized for detection and/or diagnosis of SARS-CoV-2 by FDA under an Emergency Use Authorization (EUA). This EUA will remain  in effect (meaning this test can be used) for the duration of the COVID-19 declaration under Section 564(b)(1) of the Act, 21 U.S.C.section 360bbb-3(b)(1), unless the authorization is terminated  or revoked sooner.       Influenza A by PCR  07/25/2022 NEGATIVE  NEGATIVE Final   Influenza B by PCR 07/25/2022 NEGATIVE  NEGATIVE Final   Comment: (NOTE) The Xpert Xpress SARS-CoV-2/FLU/RSV plus assay is intended as an aid in the diagnosis of influenza from Nasopharyngeal swab specimens and should not be used as a sole basis for treatment. Nasal washings and aspirates are unacceptable for Xpert Xpress SARS-CoV-2/FLU/RSV testing.  Fact Sheet for Patients: BloggerCourse.comhttps://www.fda.gov/media/152166/download  Fact Sheet for Healthcare Providers: SeriousBroker.ithttps://www.fda.gov/media/152162/download  This test is not yet approved or cleared by the [place] FDA and has been authorized for detection and/or diagnosis of SARS-CoV-2 by FDA under an Emergency Use Authorization (EUA). This EUA will remain in effect (meaning this test can be used) for the duration of the COVID-19 declaration under Section 564(b)(1) of the Act, 21 U.S.C. section 360bbb-3(b)(1), unless the authorization is terminated or revoked.  Performed at [facility], [address] [place], [place] [address]    WBC 07/25/2022 8.3  4.0 - 10.5 K/uL Final   RBC 07/25/2022 4.44  3.87 - 5.11 MIL/uL Final   Hemoglobin 07/25/2022 13.7  12.0 - 15.0 g/dL Final   HCT 03/47/425910/09/2022 40.2  36.0 - 46.0 % Final   MCV 07/25/2022 90.5  80.0 - 100.0 fL Final   MCH 07/25/2022 30.9  26.0 - 34.0 pg Final   MCHC 07/25/2022 34.1  30.0 - 36.0 g/dL Final   RDW 56/38/756410/09/2022 12.2  11.5 - 15.5 % Final   Platelets 07/25/2022 248  150 - 400 K/uL Final   nRBC 07/25/2022 0.0  0.0 - 0.2 % Final   Neutrophils Relative % 07/25/2022 43  % Final   Neutro Abs 07/25/2022 3.6  1.7 - 7.7 K/uL Final   Lymphocytes Relative 07/25/2022 52  % Final   Lymphs Abs 07/25/2022 4.2 (H)  0.7 - 4.0 K/uL Final   Monocytes Relative 07/25/2022 5  % Final   Monocytes Absolute 07/25/2022 0.4  0.1 - 1.0 K/uL Final  Eosinophils Relative 07/25/2022 0  % Final   Eosinophils Absolute 07/25/2022 0.0  0.0 - 0.5 K/uL Final   Basophils  Relative 07/25/2022 0  % Final   Basophils Absolute 07/25/2022 0.0  0.0 - 0.1 K/uL Final   Immature Granulocytes 07/25/2022 0  % Final   Abs Immature Granulocytes 07/25/2022 0.02  0.00 - 0.07 K/uL Final   Performed at [facility], [address], [place], [place] [address]   Sodium 07/25/2022 138  135 - 145 mmol/L Final   Potassium 07/25/2022 4.0  3.5 - 5.1 mmol/L Final   Chloride 07/25/2022 104  98 - 111 mmol/L Final   CO2 07/25/2022 29  22 - 32 mmol/L Final   Glucose, Bld 07/25/2022 83  70 - 99 mg/dL Final   Glucose reference range applies only to samples taken after fasting for at least 8 hours.   BUN 07/25/2022 11  6 - 20 mg/dL Final   Creatinine, Ser 07/25/2022 0.97  0.44 - 1.00 mg/dL Final   Calcium 60/45/4098 9.2  8.9 - 10.3 mg/dL Final   Total Protein 11/91/4782 7.0  6.5 - 8.1 g/dL Final   Albumin 95/62/1308 3.8  3.5 - 5.0 g/dL Final   AST 65/78/4696 18  15 - 41 U/L Final   ALT 07/25/2022 22  0 - 44 U/L Final   Alkaline Phosphatase 07/25/2022 64  38 - 126 U/L Final   Total Bilirubin 07/25/2022 0.2 (L)  0.3 - 1.2 mg/dL Final   GFR, Estimated 07/25/2022 >60  >60 mL/min Final   Comment: (NOTE) Calculated using the CKD-EPI Creatinine Equation (2021)    Anion gap 07/25/2022 5  5 - 15 Final   Performed at [facility], [address] [place], [place] [address]   POC Amphetamine UR 07/25/2022 None Detected  NONE DETECTED (Cut Off Level 1000 ng/mL) Preliminary   POC Secobarbital (BAR) 07/25/2022 None Detected  NONE DETECTED (Cut Off Level 300 ng/mL) Preliminary   POC Buprenorphine (BUP) 07/25/2022 None Detected  NONE DETECTED (Cut Off Level 10 ng/mL) Preliminary   POC Oxazepam (BZO) 07/25/2022 None Detected  NONE DETECTED (Cut Off Level 300 ng/mL) Preliminary   POC Cocaine UR 07/25/2022 None Detected  NONE DETECTED (Cut Off Level 300 ng/mL) Preliminary   POC Methamphetamine UR 07/25/2022 None Detected  NONE DETECTED (Cut Off Level 1000 ng/mL) Preliminary   POC Morphine  07/25/2022 None Detected  NONE DETECTED (Cut Off Level 300 ng/mL) Preliminary   POC Methadone UR 07/25/2022 None Detected  NONE DETECTED (Cut Off Level 300 ng/mL) Preliminary   POC Oxycodone UR 07/25/2022 None Detected  NONE DETECTED (Cut Off Level 100 ng/mL) Preliminary   POC Marijuana UR 07/25/2022 None Detected  NONE DETECTED (Cut Off Level 50 ng/mL) Preliminary   SARSCOV2ONAVIRUS 2 AG 07/25/2022 NEGATIVE  NEGATIVE Final   Comment: (NOTE) SARS-CoV-2 antigen NOT DETECTED.   Negative results are presumptive.  Negative results do not preclude SARS-CoV-2 infection and should not be used as the sole basis for treatment or other patient management decisions, including infection  control decisions, particularly in the presence of clinical signs and  symptoms consistent with COVID-19, or in those who have been in contact with the virus.  Negative results must be combined with clinical observations, patient history, and epidemiological information. The expected result is Negative.  Fact Sheet for Patients: https://www.jennings-kim.com/  Fact Sheet for Healthcare Providers: https://alexander-rogers.biz/  This test is not yet approved or cleared by the [place] FDA and  has been  authorized for detection and/or diagnosis of SARS-CoV-2 by FDA under an Emergency Use Authorization (EUA).  This EUA will remain in effect (meaning this test can be used) for the duration of  the COV                          ID-19 declaration under Section 564(b)(1) of the Act, 21 U.S.C. section 360bbb-3(b)(1), unless the authorization is terminated or revoked sooner.     Cholesterol 07/25/2022 181  0 - 200 mg/dL Final   Triglycerides 56/21/3086 118  <150 mg/dL Final   HDL 57/84/6962 45  >40 mg/dL Final   Total CHOL/HDL Ratio 07/25/2022 4.0  RATIO Final   VLDL 07/25/2022 24  0 - 40 mg/dL Final   LDL Cholesterol 07/25/2022 112 (H)  0 - 99 mg/dL Final   Comment:        Total  Cholesterol/HDL:CHD Risk Coronary Heart Disease Risk Table                     Men   Women  1/2 Average Risk   3.4   3.3  Average Risk       5.0   4.4  2 X Average Risk   9.6   7.1  3 X Average Risk  23.4   11.0        Use the calculated Patient Ratio above and the CHD Risk Table to determine the patient's CHD Risk.        ATP III CLASSIFICATION (LDL):  <100     mg/dL   Optimal  952-841  mg/dL   Near or Above                    Optimal  130-159  mg/dL   Borderline  324-401  mg/dL   High  >027     mg/dL   Very High Performed at [facility], [address], [place], [place] [address]    TSH 07/25/2022 6.668 (H)  0.350 - 4.500 uIU/mL Final   Comment: Performed by a 3rd Generation assay with a functional sensitivity of <=0.01 uIU/mL. Performed at [facility], [address] [place], [place] [address]    Glucose-Capillary 07/26/2022 104 (H)  70 - 99 mg/dL Final   Glucose reference range applies only to samples taken after fasting for at least 8 hours.   T3, Free 07/31/2022 2.3  2.0 - 4.4 pg/mL Final   Comment: (NOTE) Performed At: [facility] [address] [place], [place] [id number] Jolene Schimke MD VF:[id number]    Free T4 07/31/2022 0.60 (L)  0.61 - 1.12 ng/dL Final   Comment: (NOTE) Biotin ingestion may interfere with free T4 tests. If the results are inconsistent with the TSH level, previous test results, or the clinical presentation, then consider biotin interference. If needed, order repeat testing after stopping biotin. Performed at [facility], [address], [place], [place] [address]    Glucose-Capillary 08/29/2022 100 (H)  70 - 99 mg/dL Final   Glucose reference range applies only to samples taken after fasting for at least 8 hours.   TSH 09/05/2022 0.793  0.350 - 4.500 uIU/mL Final   Comment: Performed by a 3rd Generation assay with a functional sensitivity of <=0.01 uIU/mL. Performed at [facility], [address]  [place], [place] [address]    Free T4 09/05/2022 0.73  0.61 - 1.12 ng/dL Final  Comment: (NOTE) Biotin ingestion may interfere with free T4 tests. If the results are inconsistent with the TSH level, previous test results, or the clinical presentation, then consider biotin interference. If needed, order repeat testing after stopping biotin. Performed at [facility], [place] [address], [place], [place] [address]    Preg Test, Ur 09/06/2022 Negative  Negative Final   Preg Test, Ur 09/06/2022 NEGATIVE  NEGATIVE Final   Comment:        THE SENSITIVITY OF THIS METHODOLOGY IS >24 mIU/mL    Preg Test, Ur 09/05/2022 NEGATIVE  NEGATIVE Final   Comment:        THE SENSITIVITY OF THIS METHODOLOGY IS >24 mIU/mL    Sodium 09/13/2022 136  135 - 145 mmol/L Final   Potassium 09/13/2022 4.5  3.5 - 5.1 mmol/L Final   Chloride 09/13/2022 105  98 - 111 mmol/L Final   CO2 09/13/2022 21 (L)  22 - 32 mmol/L Final   Glucose, Bld 09/13/2022 111 (H)  70 - 99 mg/dL Final   Glucose reference range applies only to samples taken after fasting for at least 8 hours.   BUN 09/13/2022 14  6 - 20 mg/dL Final   Creatinine, Ser 09/13/2022 0.92  0.44 - 1.00 mg/dL Final   Calcium 09/13/2022 9.1  8.9 - 10.3 mg/dL Final   GFR, Estimated 09/13/2022 >60  >60 mL/min Final   Comment: (NOTE) Calculated using the CKD-EPI Creatinine Equation (2021)    Anion gap 09/13/2022 10  5 - 15 Final   Performed at [facility], [place] [address] [place], [place] [address]   Vitamin B-12 09/13/2022 585  180 - 914 pg/mL Final   Comment: (NOTE) This assay is not validated for testing neonatal or myeloproliferative syndrome specimens for Vitamin B12 levels. Performed at [facility], [place] [address], [place], [place] [address]    Color, Urine 09/14/2022 YELLOW  YELLOW Final   APPearance 09/14/2022 HAZY (A)  CLEAR Final   Specific Gravity, Urine 09/14/2022 1.025  1.005 - 1.030 Final   pH 09/14/2022 5.0  5.0 - 8.0 Final    Glucose, UA 09/14/2022 NEGATIVE  NEGATIVE mg/dL Final   Hgb urine dipstick 09/14/2022 NEGATIVE  NEGATIVE Final   Bilirubin Urine 09/14/2022 NEGATIVE  NEGATIVE Final   Ketones, ur 09/14/2022 NEGATIVE  NEGATIVE mg/dL Final   Protein, ur 09/14/2022 NEGATIVE  NEGATIVE mg/dL Final   Nitrite 09/14/2022 NEGATIVE  NEGATIVE Final   Leukocytes,Ua 09/14/2022 TRACE (A)  NEGATIVE Final   RBC / HPF 09/14/2022 0-5  0 - 5 RBC/hpf Final   WBC, UA 09/14/2022 0-5  0 - 5 WBC/hpf Final   Bacteria, UA 09/14/2022 RARE (A)  NONE SEEN Final   Squamous Epithelial / HPF 09/14/2022 0-5  0 - 5 Final   Mucus 09/14/2022 PRESENT   Final   Performed at [facility], [place] [address] [place], [place] [address]   Glucose-Capillary 09/16/2022 105 (H)  70 - 99 mg/dL Final   Glucose reference range applies only to samples taken after fasting for at least 8 hours.   SARS Coronavirus 2 by RT PCR 09/30/2022 NEGATIVE  NEGATIVE Final   Comment: (NOTE) SARS-CoV-2 target nucleic acids are NOT DETECTED.  The SARS-CoV-2 RNA is generally detectable in upper respiratory specimens during the acute phase of infection. The lowest concentration of SARS-CoV-2 viral copies this assay can detect is 138 copies/mL. A negative result does not preclude SARS-Cov-2 infection and should not be used as  the sole basis for treatment or other patient management decisions. A negative result may occur with  improper specimen collection/handling, submission of specimen other than nasopharyngeal swab, presence of viral mutation(s) within the areas targeted by this assay, and inadequate number of viral copies(<138 copies/mL). A negative result must be combined with clinical observations, patient history, and epidemiological information. The expected result is Negative.  Fact Sheet for Patients:  BloggerCourse.comhttps://www.fda.gov/media/152166/download  Fact Sheet for Healthcare Providers:  SeriousBroker.ithttps://www.fda.gov/media/152162/download  This test is no                           t yet approved or cleared by the [place] FDA and  has been authorized for detection and/or diagnosis of SARS-CoV-2 by FDA under an Emergency Use Authorization (EUA). This EUA will remain  in effect (meaning this test can be used) for the duration of the COVID-19 declaration under Section 564(b)(1) of the Act, 21 U.S.C.section 360bbb-3(b)(1), unless the authorization is terminated  or revoked sooner.       Influenza A by PCR 09/30/2022 NEGATIVE  NEGATIVE Final   Influenza B by PCR 09/30/2022 NEGATIVE  NEGATIVE Final   Comment: (NOTE) The Xpert Xpress SARS-CoV-2/FLU/RSV plus assay is intended as an aid in the diagnosis of influenza from Nasopharyngeal swab specimens and should not be used as a sole basis for treatment. Nasal washings and aspirates are unacceptable for Xpert Xpress SARS-CoV-2/FLU/RSV testing.  Fact Sheet for Patients: BloggerCourse.comhttps://www.fda.gov/media/152166/download  Fact Sheet for Healthcare Providers: SeriousBroker.ithttps://www.fda.gov/media/152162/download  This test is not yet approved or cleared by the [place] FDA and has been authorized for detection and/or diagnosis of SARS-CoV-2 by FDA under an Emergency Use Authorization (EUA). This EUA will remain in effect (meaning this test can be used) for the duration of the COVID-19 declaration under Section 564(b)(1) of the Act, 21 U.S.C. section 360bbb-3(b)(1), unless the authorization is terminated or revoked.     Resp Syncytial Virus by PCR 09/30/2022 NEGATIVE  NEGATIVE Final   Comment: (NOTE) Fact Sheet for Patients: BloggerCourse.comhttps://www.fda.gov/media/152166/download  Fact Sheet for Healthcare Providers: SeriousBroker.ithttps://www.fda.gov/media/152162/download  This test is not yet approved or cleared by the [place] FDA and has been authorized for detection and/or diagnosis of SARS-CoV-2 by FDA under an Emergency Use Authorization (EUA). This EUA will remain in effect (meaning this test can be used) for the duration of  the COVID-19 declaration under Section 564(b)(1) of the Act, 21 U.S.C. section 360bbb-3(b)(1), unless the authorization is terminated or revoked.  Performed at [facility], [address] [place], [place] [address]    Sodium 10/01/2022 136  135 - 145 mmol/L Final   Potassium 10/01/2022 3.8  3.5 - 5.1 mmol/L Final   Chloride 10/01/2022 104  98 - 111 mmol/L Final   CO2 10/01/2022 27  22 - 32 mmol/L Final   Glucose, Bld 10/01/2022 98  70 - 99 mg/dL Final   Glucose reference range applies only to samples taken after fasting for at least 8 hours.   BUN 10/01/2022 12  6 - 20 mg/dL Final   Creatinine, Ser 10/01/2022 0.98  0.44 - 1.00 mg/dL Final   Calcium 62/13/086512/19/2023 8.9  8.9 - 10.3 mg/dL Final   GFR, Estimated 10/01/2022 >60  >60 mL/min Final   Comment: (NOTE) Calculated using the CKD-EPI Creatinine Equation (2021)    Anion gap 10/01/2022 5  5 - 15 Final   Performed at [facility], [address] [place], [place] [address]  WBC 10/01/2022 5.1  4.0 - 10.5 K/uL Final   RBC 10/01/2022 4.31  3.87 - 5.11 MIL/uL Final   Hemoglobin 10/01/2022 12.7  12.0 - 15.0 g/dL Final   HCT 10/01/2022 38.8  36.0 - 46.0 % Final   MCV 10/01/2022 90.0  80.0 - 100.0 fL Final   MCH 10/01/2022 29.5  26.0 - 34.0 pg Final   MCHC 10/01/2022 32.7  30.0 - 36.0 g/dL Final   RDW 10/01/2022 12.4  11.5 - 15.5 % Final   Platelets 10/01/2022 300  150 - 400 K/uL Final   nRBC 10/01/2022 0.0  0.0 - 0.2 % Final   Performed at [facility], [place] [address], [place], [place] [address]   Lipase 10/01/2022 29  11 - 51 U/L Final   Performed at [place] [address], [place], [place] [address]   Color, Urine 10/05/2022 YELLOW  YELLOW Final   APPearance 10/05/2022 HAZY (A)  CLEAR Final   Specific Gravity, Urine 10/05/2022 1.018  1.005 - 1.030 Final   pH 10/05/2022 5.0  5.0 - 8.0 Final   Glucose, UA 10/05/2022 NEGATIVE  NEGATIVE mg/dL Final   Hgb urine dipstick 10/05/2022 NEGATIVE  NEGATIVE Final    Bilirubin Urine 10/05/2022 NEGATIVE  NEGATIVE Final   Ketones, ur 10/05/2022 NEGATIVE  NEGATIVE mg/dL Final   Protein, ur 10/05/2022 NEGATIVE  NEGATIVE mg/dL Final   Nitrite 10/05/2022 NEGATIVE  NEGATIVE Final   Leukocytes,Ua 10/05/2022 TRACE (A)  NEGATIVE Final   RBC / HPF 10/05/2022 0-5  0 - 5 RBC/hpf Final   WBC, UA 10/05/2022 0-5  0 - 5 WBC/hpf Final   Bacteria, UA 10/05/2022 RARE (A)  NONE SEEN Final   Squamous Epithelial / HPF 10/05/2022 0-5  0 - 5 Final   Mucus 10/05/2022 PRESENT   Final   Performed at [facility], [place] [address] [place], [place] [address]   Specimen Description 10/05/2022 URINE, CLEAN CATCH   Final   Special Requests 10/05/2022    Final                   Value:NONE Performed at [facility], [place] [address] [place], [place] [address]    Culture 10/05/2022 10,000 COLONIES/mL MULTIPLE SPECIES PRESENT, SUGGEST RECOLLECTION (A)   Final   Report Status 10/05/2022 10/07/2022 FINAL   Final  Admission on 07/04/2022, Discharged on 07/05/2022  Component Date Value Ref Range Status   Sodium 07/04/2022 142  135 - 145 mmol/L Final   Potassium 07/04/2022 4.4  3.5 - 5.1 mmol/L Final   Chloride 07/04/2022 109  98 - 111 mmol/L Final   CO2 07/04/2022 26  22 - 32 mmol/L Final   Glucose, Bld 07/04/2022 96  70 - 99 mg/dL Final   Glucose reference range applies only to samples taken after fasting for at least 8 hours.   BUN 07/04/2022 15  6 - 20 mg/dL Final   Creatinine, Ser 07/04/2022 0.83  0.44 - 1.00 mg/dL Final   Calcium 07/04/2022 9.3  8.9 - 10.3 mg/dL Final   Total Protein 07/04/2022 7.2  6.5 - 8.1 g/dL Final   Albumin 07/04/2022 3.9  3.5 - 5.0 g/dL Final   AST 07/04/2022 23  15 - 41 U/L Final   ALT 07/04/2022 28  0 - 44 U/L Final   Alkaline Phosphatase 07/04/2022 77  38 - 126 U/L Final   Total Bilirubin 07/04/2022 0.4  0.3 - 1.2 mg/dL Final   GFR, Estimated  07/04/2022 >60  >60 mL/min Final   Comment: (NOTE) Calculated using the CKD-EPI Creatinine  Equation (2021)    Anion gap 07/04/2022 7  5 - 15 Final   Performed at [facility], [address] [place], [place] [address]   Alcohol, Ethyl (B) 07/04/2022 <10  <10 mg/dL Final   Comment: (NOTE) Lowest detectable limit for serum alcohol is 10 mg/dL.  For medical purposes only. Performed at [facility], [address], [place], [place] [address]    WBC 07/04/2022 7.0  4.0 - 10.5 K/uL Final   RBC 07/04/2022 4.18  3.87 - 5.11 MIL/uL Final   Hemoglobin 07/04/2022 12.8  12.0 - 15.0 g/dL Final   HCT 54/65/6812 39.1  36.0 - 46.0 % Final   MCV 07/04/2022 93.5  80.0 - 100.0 fL Final   MCH 07/04/2022 30.6  26.0 - 34.0 pg Final   MCHC 07/04/2022 32.7  30.0 - 36.0 g/dL Final   RDW 75/17/0017 12.9  11.5 - 15.5 % Final   Platelets 07/04/2022 243  150 - 400 K/uL Final   nRBC 07/04/2022 0.0  0.0 - 0.2 % Final   Neutrophils Relative % 07/04/2022 43  % Final   Neutro Abs 07/04/2022 3.0  1.7 - 7.7 K/uL Final   Lymphocytes Relative 07/04/2022 50  % Final   Lymphs Abs 07/04/2022 3.5  0.7 - 4.0 K/uL Final   Monocytes Relative 07/04/2022 7  % Final   Monocytes Absolute 07/04/2022 0.5  0.1 - 1.0 K/uL Final   Eosinophils Relative 07/04/2022 0  % Final   Eosinophils Absolute 07/04/2022 0.0  0.0 - 0.5 K/uL Final   Basophils Relative 07/04/2022 0  % Final   Basophils Absolute 07/04/2022 0.0  0.0 - 0.1 K/uL Final   Immature Granulocytes 07/04/2022 0  % Final   Abs Immature Granulocytes 07/04/2022 0.01  0.00 - 0.07 K/uL Final   Performed at [facility], [address], [place], [place] [address]   I-stat hCG, quantitative 07/04/2022 <5.0  <5 mIU/mL Final   Comment 3 07/04/2022          Final   Comment:   GEST. AGE      CONC.  (mIU/mL)   <=1 WEEK        5 - 50     2 WEEKS       50 - 500     3 WEEKS       100 - 10,000     4 WEEKS     1,000 - 30,000        FEMALE AND NON-PREGNANT FEMALE:     LESS THAN 5 mIU/mL   Admission on 06/14/2022,  Discharged on 06/14/2022  Component Date Value Ref Range Status   Color, UA 06/14/2022 yellow  yellow Final   Clarity, UA 06/14/2022 cloudy (A)  clear Final   Glucose, UA 06/14/2022 negative  negative mg/dL Final   Bilirubin, UA 67/59/1638 negative  negative Final   Ketones, POC UA 06/14/2022 negative  negative mg/dL Final   Spec Grav, UA 46/65/9935 1.025  1.010 - 1.025 Final   Blood, UA 06/14/2022 negative  negative Final   pH, UA 06/14/2022 7.5  5.0 - 8.0 Final   Protein Ur, POC 06/14/2022 =30 (A)  negative mg/dL Final   Urobilinogen, UA 06/14/2022 0.2  0.2 or 1.0 E.U./dL Final   Nitrite, UA 70/17/7939 Negative  Negative Final   Leukocytes, UA 06/14/2022 Negative  Negative Final   Preg  Test, Ur 06/14/2022 Negative  Negative Final   Specimen Description 06/14/2022 URINE, CLEAN CATCH   Final   Special Requests 06/14/2022    Final                   Value:NONE Performed at [facility], [address] [place], [place] [address]    Culture 06/14/2022 MULTIPLE SPECIES PRESENT, SUGGEST RECOLLECTION (A)   Final   Report Status 06/14/2022 06/16/2022 FINAL   Final  Admission on 06/07/2022, Discharged on 06/10/2022  Component Date Value Ref Range Status   SARS Coronavirus 2 by RT PCR 06/07/2022 NEGATIVE  NEGATIVE Final   Comment: (NOTE) SARS-CoV-2 target nucleic acids are NOT DETECTED.  The SARS-CoV-2 RNA is generally detectable in upper respiratory specimens during the acute phase of infection. The lowest concentration of SARS-CoV-2 viral copies this assay can detect is 138 copies/mL. A negative result does not preclude SARS-Cov-2 infection and should not be used as the sole basis for treatment or other patient management decisions. A negative result may occur with  improper specimen collection/handling, submission of specimen other than nasopharyngeal swab, presence of viral mutation(s) within the areas targeted by this assay, and inadequate number of viral copies(<138  copies/mL). A negative result must be combined with clinical observations, patient history, and epidemiological information. The expected result is Negative.  Fact Sheet for Patients:  BloggerCourse.com  Fact Sheet for Healthcare Providers:  SeriousBroker.it  This test is no                          t yet approved or cleared by the [place] FDA and  has been authorized for detection and/or diagnosis of SARS-CoV-2 by FDA under an Emergency Use Authorization (EUA). This EUA will remain  in effect (meaning this test can be used) for the duration of the COVID-19 declaration under Section 564(b)(1) of the Act, 21 U.S.C.section 360bbb-3(b)(1), unless the authorization is terminated  or revoked sooner.       Influenza A by PCR 06/07/2022 NEGATIVE  NEGATIVE Final   Influenza B by PCR 06/07/2022 NEGATIVE  NEGATIVE Final   Comment: (NOTE) The Xpert Xpress SARS-CoV-2/FLU/RSV plus assay is intended as an aid in the diagnosis of influenza from Nasopharyngeal swab specimens and should not be used as a sole basis for treatment. Nasal washings and aspirates are unacceptable for Xpert Xpress SARS-CoV-2/FLU/RSV testing.  Fact Sheet for Patients: BloggerCourse.com  Fact Sheet for Healthcare Providers: SeriousBroker.it  This test is not yet approved or cleared by the [place] FDA and has been authorized for detection and/or diagnosis of SARS-CoV-2 by FDA under an Emergency Use Authorization (EUA). This EUA will remain in effect (meaning this test can be used) for the duration of the COVID-19 declaration under Section 564(b)(1) of the Act, 21 U.S.C. section 360bbb-3(b)(1), unless the authorization is terminated or revoked.  Performed at [facility], [address] [place], [place] [address]    WBC 06/07/2022 5.7  4.0 - 10.5 K/uL Final   RBC 06/07/2022 4.39  3.87 - 5.11 MIL/uL  Final   Hemoglobin 06/07/2022 13.2  12.0 - 15.0 g/dL Final   HCT 09/81/1914 40.4  36.0 - 46.0 % Final   MCV 06/07/2022 92.0  80.0 - 100.0 fL Final   MCH 06/07/2022 30.1  26.0 - 34.0 pg Final   MCHC 06/07/2022 32.7  30.0 - 36.0 g/dL Final   RDW 78/29/5621 12.4  11.5 - 15.5 % Final  Platelets 06/07/2022 308  150 - 400 K/uL Final   nRBC 06/07/2022 0.0  0.0 - 0.2 % Final   Neutrophils Relative % 06/07/2022 42  % Final   Neutro Abs 06/07/2022 2.4  1.7 - 7.7 K/uL Final   Lymphocytes Relative 06/07/2022 54  % Final   Lymphs Abs 06/07/2022 3.1  0.7 - 4.0 K/uL Final   Monocytes Relative 06/07/2022 4  % Final   Monocytes Absolute 06/07/2022 0.2  0.1 - 1.0 K/uL Final   Eosinophils Relative 06/07/2022 0  % Final   Eosinophils Absolute 06/07/2022 0.0  0.0 - 0.5 K/uL Final   Basophils Relative 06/07/2022 0  % Final   Basophils Absolute 06/07/2022 0.0  0.0 - 0.1 K/uL Final   Immature Granulocytes 06/07/2022 0  % Final   Abs Immature Granulocytes 06/07/2022 0.01  0.00 - 0.07 K/uL Final   Performed at [facility], [place] [address], [place], [place] [address]   Sodium 06/07/2022 139  135 - 145 mmol/L Final   Potassium 06/07/2022 4.0  3.5 - 5.1 mmol/L Final   Chloride 06/07/2022 104  98 - 111 mmol/L Final   CO2 06/07/2022 28  22 - 32 mmol/L Final   Glucose, Bld 06/07/2022 104 (H)  70 - 99 mg/dL Final   Glucose reference range applies only to samples taken after fasting for at least 8 hours.   BUN 06/07/2022 8  6 - 20 mg/dL Final   Creatinine, Ser 06/07/2022 0.84  0.44 - 1.00 mg/dL Final   Calcium 06/07/2022 9.1  8.9 - 10.3 mg/dL Final   Total Protein 06/07/2022 6.9  6.5 - 8.1 g/dL Final   Albumin 06/07/2022 3.7  3.5 - 5.0 g/dL Final   AST 06/07/2022 19  15 - 41 U/L Final   ALT 06/07/2022 22  0 - 44 U/L Final   Alkaline Phosphatase 06/07/2022 54  38 - 126 U/L Final   Total Bilirubin 06/07/2022 0.4  0.3 - 1.2 mg/dL Final   GFR, Estimated 06/07/2022 >60  >60 mL/min Final   Comment:  (NOTE) Calculated using the CKD-EPI Creatinine Equation (2021)    Anion gap 06/07/2022 7  5 - 15 Final   Performed at [place] [address] [place], [place] [address]   Hgb A1c MFr Bld 06/07/2022 5.0  4.8 - 5.6 % Final   Comment: (NOTE) Pre diabetes:          5.7%-6.4%  Diabetes:              >6.4%  Glycemic control for   <7.0% adults with diabetes    Mean Plasma Glucose 06/07/2022 96.8  mg/dL Final   Performed at [facility], [place] [address], [place], [place] [address]   TSH 06/07/2022 1.620  0.350 - 4.500 uIU/mL Final   Comment: Performed by a 3rd Generation assay with a functional sensitivity of <=0.01 uIU/mL. Performed at [facility], [place] [address] [place], [place] [address]    RPR Ser Ql 06/07/2022 NON REACTIVE  NON REACTIVE Final   Performed at [facility], [place] [address] [place], [place] [address]   Color, Urine 06/07/2022 YELLOW  YELLOW Final   APPearance 06/07/2022 HAZY (A)  CLEAR Final   Specific Gravity, Urine 06/07/2022 1.018  1.005 - 1.030 Final   pH 06/07/2022 7.0  5.0 - 8.0 Final   Glucose, UA 06/07/2022 NEGATIVE  NEGATIVE mg/dL Final   Hgb urine dipstick 06/07/2022 NEGATIVE  NEGATIVE Final  Bilirubin Urine 06/07/2022 NEGATIVE  NEGATIVE Final   Ketones, ur 06/07/2022 NEGATIVE  NEGATIVE mg/dL Final   Protein, ur 16/07/9603 NEGATIVE  NEGATIVE mg/dL Final   Nitrite 54/06/8118 NEGATIVE  NEGATIVE Final   Leukocytes,Ua 06/07/2022 NEGATIVE  NEGATIVE Final   Performed at [facility], [address] [place], [place] [address]   Cholesterol 06/07/2022 164  0 - 200 mg/dL Final   Triglycerides 95/62/1308 158 (H)  <150 mg/dL Final   HDL 65/78/4696 44  >40 mg/dL Final   Total CHOL/HDL Ratio 06/07/2022 3.7  RATIO Final   VLDL 06/07/2022 32  0 - 40 mg/dL Final   LDL Cholesterol 06/07/2022 88  0 - 99 mg/dL Final   Comment:        Total Cholesterol/HDL:CHD Risk Coronary Heart Disease Risk Table                     Men   Women  1/2  Average Risk   3.4   3.3  Average Risk       5.0   4.4  2 X Average Risk   9.6   7.1  3 X Average Risk  23.4   11.0        Use the calculated Patient Ratio above and the CHD Risk Table to determine the patient's CHD Risk.        ATP III CLASSIFICATION (LDL):  <100     mg/dL   Optimal  295-284  mg/dL   Near or Above                    Optimal  130-159  mg/dL   Borderline  132-440  mg/dL   High  >102     mg/dL   Very High Performed at [facility], [address], [place], [place] [address]    HIV Screen 4th Generation wRfx 06/07/2022 Non Reactive  Non Reactive Final   Performed at [facility], [address] [place], [place] [address]   SARSCOV2ONAVIRUS 2 AG 06/07/2022 NEGATIVE  NEGATIVE Final   Comment: (NOTE) SARS-CoV-2 antigen NOT DETECTED.   Negative results are presumptive.  Negative results do not preclude SARS-CoV-2 infection and should not be used as the sole basis for treatment or other patient management decisions, including infection  control decisions, particularly in the presence of clinical signs and  symptoms consistent with COVID-19, or in those who have been in contact with the virus.  Negative results must be combined with clinical observations, patient history, and epidemiological information. The expected result is Negative.  Fact Sheet for Patients: https://www.jennings-kim.com/  Fact Sheet for Healthcare Providers: https://alexander-rogers.biz/  This test is not yet approved or cleared by the [place] FDA and  has been authorized for detection and/or diagnosis of SARS-CoV-2 by FDA under an Emergency Use Authorization (EUA).  This EUA will remain in effect (meaning this test can be used) for the duration of  the COV                          ID-19 declaration under Section 564(b)(1) of the Act, 21 U.S.C. section 360bbb-3(b)(1), unless the authorization is terminated or revoked sooner.     POC Amphetamine UR  06/07/2022 None Detected  NONE DETECTED (Cut Off Level 1000 ng/mL) Final   POC Secobarbital (BAR) 06/07/2022 None Detected  NONE DETECTED (Cut Off Level 300 ng/mL) Final   POC Buprenorphine (BUP) 06/07/2022  None Detected  NONE DETECTED (Cut Off Level 10 ng/mL) Final   POC Oxazepam (BZO) 06/07/2022 None Detected  NONE DETECTED (Cut Off Level 300 ng/mL) Final   POC Cocaine UR 06/07/2022 None Detected  NONE DETECTED (Cut Off Level 300 ng/mL) Final   POC Methamphetamine UR 06/07/2022 None Detected  NONE DETECTED (Cut Off Level 1000 ng/mL) Final   POC Morphine 06/07/2022 None Detected  NONE DETECTED (Cut Off Level 300 ng/mL) Final   POC Methadone UR 06/07/2022 None Detected  NONE DETECTED (Cut Off Level 300 ng/mL) Final   POC Oxycodone UR 06/07/2022 None Detected  NONE DETECTED (Cut Off Level 100 ng/mL) Final   POC Marijuana UR 06/07/2022 None Detected  NONE DETECTED (Cut Off Level 50 ng/mL) Final   Preg Test, Ur 06/08/2022 NEGATIVE  NEGATIVE Final   Comment:        THE SENSITIVITY OF THIS METHODOLOGY IS >20 mIU/mL. Performed at [facility], [address] [place], [place] [address]    Preg Test, Ur 06/08/2022 NEGATIVE  NEGATIVE Final   Comment:        THE SENSITIVITY OF THIS METHODOLOGY IS >24 mIU/mL     Blood Alcohol level:  Lab Results  Component Value Date   ETH <10 07/04/2022   ETH <10 02/07/2022    Metabolic Disorder Labs: Lab Results  Component Value Date   HGBA1C 5.7 (H) 10/15/2022   MPG 117 10/15/2022   MPG 96.8 06/07/2022   No results found for: "PROLACTIN" Lab Results  Component Value Date   CHOL 181 07/25/2022   TRIG 118 07/25/2022   HDL 45 07/25/2022   CHOLHDL 4.0 07/25/2022   VLDL 24 07/25/2022   LDLCALC 112 (H) 07/25/2022   LDLCALC 88 06/07/2022    Therapeutic Lab Levels: No results found for: "LITHIUM" No results found for: "VALPROATE" No results found for: "CBMZ"  Physical Findings   GAD-7    Flowsheet Row [facility]  Total GAD-7 Score 17      PHQ2-9    Flowsheet Row [facility]  PHQ-2 Total Score 0 1 2  PHQ-9 Total Score -- 2 8      Flowsheet Row [facility] Most recent reading at 11/07/2022  9:32 AM ED [facility] Most recent reading at 10/07/2022 12:18 PM [facility] Most recent reading at 10/06/2022  9:38 AM  C-SSRS RISK CATEGORY No Risk No Risk No Risk        Musculoskeletal  Strength & Muscle Tone: within normal limits Gait & Station: normal Patient leans: N/A  Psychiatric Specialty Exam  Presentation  General Appearance:  Appropriate for Environment; Casual  Eye Contact: Good  Speech: Clear and Coherent; Normal Rate  Speech Volume: Normal  Handedness: Right   Mood and Affect  Mood: Euthymic  Affect: Congruent   Thought Process  Thought Processes: Coherent  Descriptions of Associations:Intact  Orientation:Full (Time, Place and Person)  Thought Content:Logical  Diagnosis of Schizophrenia or Schizoaffective disorder in past: Yes  Duration of Psychotic Symptoms: Greater than six months   Hallucinations:Hallucinations: None  Ideas of Reference:None  Suicidal Thoughts:Suicidal Thoughts: No  Homicidal Thoughts:Homicidal Thoughts:  No   Sensorium  Memory: Immediate Good; Recent Good; Remote Good  Judgment: Intact  Insight: Present   Executive Functions  Concentration: Good  Attention Span: Good  Recall: Good  Fund of Knowledge: Good  Language: Good   Psychomotor Activity  Psychomotor Activity: Psychomotor Activity:  Normal   Assets  Assets: Leisure Time; Physical Health; Resilience   Sleep  Sleep: Sleep: Good   Nutritional Assessment (For OBS and [facility] admissions only) Has the patient had a weight loss or gain of 10 pounds or more in the last 3 months?: No Has the patient had a decrease in food intake/or appetite?: No Does the patient have dental problems?: No Does the patient have eating habits or behaviors that may be indicators of an eating disorder including binging or inducing vomiting?: No Has the patient recently lost weight without trying?: 0 Has the patient been eating poorly because of a decreased appetite?: 0 Malnutrition Screening Tool Score: 0    Physical Exam  Physical Exam Vitals and nursing note reviewed.  Constitutional:      General: She is not in acute distress.    Appearance: Normal appearance. She is not ill-appearing.  HENT:     Head: Normocephalic.  Eyes:     General:        Right eye: No discharge.        Left eye: No discharge.     Conjunctiva/sclera: Conjunctivae normal.  Cardiovascular:     Rate and Rhythm: Normal rate.  Pulmonary:     Effort: Pulmonary effort is normal. No respiratory distress.  Musculoskeletal:        General: Normal range of motion.     Cervical back: Normal range of motion.  Skin:    Coloration: Skin is not jaundiced or pale.  Neurological:     Mental Status: She is alert and oriented to person, place, and time.  Psychiatric:        Attention and Perception: Attention and perception normal.        Mood and Affect: Mood and affect normal.        Speech: Speech normal.        Behavior: Behavior normal.        Thought Content: Thought content normal.        Cognition and Memory: Cognition normal.        Judgment: Judgment normal.    Review of Systems  Constitutional: Negative.   HENT: Negative.    Eyes: Negative.   Respiratory: Negative.    Cardiovascular: Negative.   Musculoskeletal: Negative.   Skin: Negative.    Neurological: Negative.   Psychiatric/Behavioral: Negative.     Blood pressure 101/73, pulse (!) 111, temperature 98.1 F (36.7 C), temperature source Oral, resp. rate 17, height  (1.549 m), weight 198 lb (89.8 kg), SpO2 100 %. Body mass index is 37.41 kg/m.  Treatment Plan Summary:   Patient remains psychiatrically cleared awaiting appropriate placement.  Social work continue to work with DSS and other agencies to find appropriate placement for patient.     Will continue daily assessments to evaluate progress and any symptoms needing to be addressed, and medication management  Ardis Hughs, NP 11/19/2022 10:34 PM

## 2022-11-19 NOTE — ED Notes (Signed)
Pt asleep at this hour. No apparent distress. RR even and unlabored. Monitored for safety.

## 2022-11-20 DIAGNOSIS — F209 Schizophrenia, unspecified: Secondary | ICD-10-CM | POA: Diagnosis not present

## 2022-11-20 DIAGNOSIS — N9489 Other specified conditions associated with female genital organs and menstrual cycle: Secondary | ICD-10-CM | POA: Diagnosis not present

## 2022-11-20 DIAGNOSIS — F17200 Nicotine dependence, unspecified, uncomplicated: Secondary | ICD-10-CM | POA: Diagnosis not present

## 2022-11-20 DIAGNOSIS — E039 Hypothyroidism, unspecified: Secondary | ICD-10-CM | POA: Diagnosis not present

## 2022-11-20 DIAGNOSIS — E118 Type 2 diabetes mellitus with unspecified complications: Secondary | ICD-10-CM | POA: Diagnosis not present

## 2022-11-20 DIAGNOSIS — F419 Anxiety disorder, unspecified: Secondary | ICD-10-CM | POA: Diagnosis not present

## 2022-11-20 DIAGNOSIS — Z1152 Encounter for screening for COVID-19: Secondary | ICD-10-CM | POA: Diagnosis not present

## 2022-11-20 DIAGNOSIS — F333 Major depressive disorder, recurrent, severe with psychotic symptoms: Secondary | ICD-10-CM | POA: Diagnosis not present

## 2022-11-20 DIAGNOSIS — N390 Urinary tract infection, site not specified: Secondary | ICD-10-CM | POA: Diagnosis not present

## 2022-11-20 NOTE — ED Notes (Signed)
Pt alert at this hour. No apparent distress. RR even and unlabored. Monitored for safety.  

## 2022-11-20 NOTE — ED Notes (Signed)
Patient resting quietly in bed with eyes closed. Respirations equal and unlabored, skin warm and dry, NAD. Routine safety checks conducted according to facility protocol. Will continue to monitor for safety.  

## 2022-11-20 NOTE — ED Notes (Signed)
[name]&Ox4. Patient denies SI/Hi and AVH.  Patient denies any physical complaints when asked. No acute distress noted. Support and encouragement provided. Routine safety checks conducted according to facility protocol. Encouraged patient to notify staff if thoughts of harm toward self or others arise. Patient verbalize understanding and agreement. Will continue to monitor for safety.    

## 2022-11-20 NOTE — ED Notes (Signed)
Pt sleeping at present, no distress noted.  Monitoring for safety. 

## 2022-11-20 NOTE — Care Management (Signed)
OBS Care Management   Writer spoke to the legal guardian Marcy Salvo).  [name] reports that he his in agreement with the patient being discharged to this transitional living facility with Janifer Adie [phone]).  This facility is not associated with [facility] and will be private pay.   [name] reports that he is coordinating with the owner Mrs. Gambell in order to pay for the patient monthly fee for her to live in the facility.    Per Mrs. Gambell the patient is able to come to the facility this Saturday November 23, 2022.  Writer left a voice mail message with the IDD Care Coordinator (Merci) regarding the patient upcoming discharge on February 10. 2024.   Mrs. Simeon Craft is scheduled to meet with Roderic Ovens today 11-20-2022 at 10am to meet with her and review that rules at the facility.

## 2022-11-20 NOTE — ED Provider Notes (Signed)
Behavioral Health Progress Note  Date and Time: 11/20/2022 11:28 AM Name: Nicole MassonLatasha Testa MRN:  [id number]  Nicole [name] is a 32 y/o female with PMH of SCZ unspecified, MDD with psychosis, who [facility] via [facility] after a verbal altercation with Jeannett SeniorJosephine Okeke (not patient's legal guardian) for transient SI. This is [facility] concerns this year. Patient has been dismissed from previous group home due to threatening SI and HI, then leaving the group home. 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     Patient seen face-to-face by this provider and chart reviewed on 11/19/2022  Subjective:    On reassessment today, pt is lying down in bed, resting. She endorses euthymic mood. She denies suicidal, homicidal, or violent ideations. She denies auditory visual hallucinations or paranoia. She is calm, cooperative, pleasant. No delusions or paranoia elicited. There is no evidence of agitation, aggression, distractibility or internal preoccupation. She confirms she had an interview earlier this date for transitional living. She hopes interview went well. She denies any concerns at this time.   Diagnosis:  Final diagnoses:  Tobacco use disorder  Schizophrenia, unspecified type (HCC)  MDD (major depressive disorder), recurrent episode, moderate (HCC)  Hypothyroidism, unspecified type    Total Time spent with patient: 15 minutes  Past Psychiatric History: SCZ unspecified, MDD with psychosis, anxiety, depression, tobacco use Past Medical History: Hypothyroidism Family History: Hypertension - father diabetes Family Psychiatric  History: None reported Social History: Currently seeking placement  Additional Social History:                         Sleep: Good  Appetite:  Good  Current Medications:  Current Facility-Administered Medications  Medication Dose Route Frequency Provider Last Rate Last Admin    acetaminophen (TYLENOL) tablet 650 mg  650 mg Oral Q6H PRN Marlou SaByungura, Veronique M, NP   650 mg at 11/18/22 2114   alum & mag hydroxide-simeth (MAALOX/MYLANTA) 200-200-20 MG/5ML suspension 30 mL  30 mL Oral Q4H PRN Marlou SaByungura, Veronique M, NP   30 mL at 11/16/22 1849   ARIPiprazole ER (ABILIFY MAINTENA) 400 MG prefilled syringe 400 mg  400 mg Intramuscular Q28 days Princess BruinsNguyen, Julie, DO   400 mg at 10/25/22 0920   cetaphil lotion   Topical PRN Princess BruinsNguyen, Julie, DO       fluticasone (FLONASE) 50 MCG/ACT nasal spray 1 spray  1 spray Each Nare QHS Lauree ChandlerLee, Mclane Arora Eun, NP   1 spray at 11/19/22 2150   hydrocortisone cream 1 %   Topical QID PRN Onuoha, Chinwendu V, NP   Given at 11/14/22 [id number]   hydrOXYzine (ATARAX) tablet 25 mg  25 mg Oral TID PRN Lorri Frederickarrion-Carrero, Margely, MD   25 mg at 11/19/22 2152   ketoconazole (NIZORAL) 2 % cream   Topical BID Princess BruinsNguyen, Julie, DO   Given at 11/20/22 [id number]   levothyroxine (SYNTHROID) tablet 100 mcg  100 mcg Oral Once Byungura, Veronique M, NP       levothyroxine (SYNTHROID) tablet 100 mcg  100 mcg Oral Q0600 Marlou SaByungura, Veronique M, NP   100 mcg at 11/20/22 0618   lidocaine (LIDODERM) 5 % 1 patch  1 patch Transdermal Q24H Princess BruinsNguyen, Julie, DO   1 patch at 11/20/22 0946   loratadine (CLARITIN) tablet 10 mg  10 mg Oral Daily Carrion-Carrero, Karle StarchMargely, MD   10 mg at 11/20/22 0918   magnesium hydroxide (MILK OF MAGNESIA) suspension 30 mL  30  mL Oral Daily PRN Haynes Kerns, NP   30 mL at 10/12/22 2145   metFORMIN (GLUCOPHAGE) tablet 500 mg  500 mg Oral Q breakfast Carrion-Carrero, Caryl Ada, MD   500 mg at 11/20/22 0849   nicotine (NICODERM CQ - dosed in mg/24 hours) patch 14 mg  14 mg Transdermal Daily PRN Carrion-Carrero, Caryl Ada, MD       ondansetron (ZOFRAN-ODT) disintegrating tablet 4 mg  4 mg Oral Q8H PRN Carrion-Carrero, Margely, MD   4 mg at 11/16/22 1849   Oxcarbazepine (TRILEPTAL) tablet 300 mg  300 mg Oral BID Maple Hudson, Veronique M, NP   300 mg at 11/20/22 0918    pantoprazole (PROTONIX) EC tablet 40 mg  40 mg Oral Daily Carrion-Carrero, Margely, MD   40 mg at 11/20/22 0919   polyethylene glycol (MIRALAX / GLYCOLAX) packet 17 g  17 g Oral Daily Merrily Brittle, DO   17 g at 11/20/22 0916   QUEtiapine (SEROQUEL) tablet 400 mg  400 mg Oral BID Maple Hudson, Veronique M, NP   400 mg at 11/20/22 [id number]   sertraline (ZOLOFT) tablet 150 mg  150 mg Oral Daily Carrion-Carrero, Margely, MD   150 mg at 11/20/22 0918   sodium chloride (OCEAN) 0.65 % nasal spray 1 spray  1 spray Each Nare Daily Merrily Brittle, DO   1 spray at 11/20/22 0916   traZODone (DESYREL) tablet 100 mg  100 mg Oral QHS Ronelle Nigh M, NP   100 mg at 11/19/22 2152   traZODone (DESYREL) tablet 50 mg  50 mg Oral QHS PRN Haynes Kerns, NP   50 mg at 11/19/22 2151   valACYclovir (VALTREX) tablet 500 mg  500 mg Oral Daily Carrion-Carrero, Caryl Ada, MD   500 mg at 11/20/22 0918   zinc oxide (BALMEX) 11.3 % cream   Topical PRN Merrily Brittle, DO       Current Outpatient Medications  Medication Sig Dispense Refill   ABILIFY MAINTENA 400 MG PRSY prefilled syringe 400 mg every 28 (twenty-eight) days.     cetirizine (ZYRTEC) 10 MG tablet Take 10 mg by mouth daily.     cyclobenzaprine (FLEXERIL) 10 MG tablet Take 1 tablet (10 mg total) by mouth 2 (two) times daily as needed for muscle spasms. 20 tablet 0   fluticasone (FLONASE) 50 MCG/ACT nasal spray Place 1 spray into both nostrils daily.     meloxicam (MOBIC) 15 MG tablet Take 15 mg by mouth daily.     nitrofurantoin, macrocrystal-monohydrate, (MACROBID) 100 MG capsule Take 1 capsule (100 mg total) by mouth 2 (two) times daily. 10 capsule 0   ondansetron (ZOFRAN-ODT) 4 MG disintegrating tablet Take 1 tablet (4 mg total) by mouth every 8 (eight) hours as needed for nausea or vomiting. 20 tablet 0   Oxcarbazepine (TRILEPTAL) 300 MG tablet Take 1 tablet (300 mg total) by mouth 2 (two) times daily. 60 tablet 0   QUEtiapine (SEROQUEL) 400 MG tablet Take 1  tablet (400 mg total) by mouth 2 (two) times daily. 60 tablet 0   sertraline (ZOLOFT) 50 MG tablet Take 3 tablets (150 mg total) by mouth in the morning. 90 tablet 0   traZODone (DESYREL) 100 MG tablet Take 1 tablet (100 mg total) by mouth at bedtime. 30 tablet 0   valACYclovir (VALTREX) 500 MG tablet Take 500 mg by mouth daily.      Labs  Lab Results:  Admission on 10/07/2022  Component Date Value Ref Range Status   Hgb A1c MFr Bld 10/15/2022 5.7 (  H)  4.8 - 5.6 % Final   Comment: (NOTE)         Prediabetes: 5.7 - 6.4         Diabetes: >6.4         Glycemic control for adults with diabetes: <7.0    Mean Plasma Glucose 10/15/2022 117  mg/dL Final   Comment: (NOTE) Performed At: [facility] [place], [place] [id number] Rush Farmer MD [id number]    Color, Urine 11/13/2022 YELLOW  YELLOW Final   APPearance 11/13/2022 CLOUDY (A)  CLEAR Final   Specific Gravity, Urine 11/13/2022 1.023  1.005 - 1.030 Final   pH 11/13/2022 6.0  5.0 - 8.0 Final   Glucose, UA 11/13/2022 NEGATIVE  NEGATIVE mg/dL Final   Hgb urine dipstick 11/13/2022 NEGATIVE  NEGATIVE Final   Bilirubin Urine 11/13/2022 NEGATIVE  NEGATIVE Final   Ketones, ur 11/13/2022 NEGATIVE  NEGATIVE mg/dL Final   Protein, ur 11/13/2022 NEGATIVE  NEGATIVE mg/dL Final   Nitrite 11/13/2022 NEGATIVE  NEGATIVE Final   Leukocytes,Ua 11/13/2022 NEGATIVE  NEGATIVE Final   Performed at [facility], [place] [address] [place], [place] [address]  Office Visit on 10/21/2022  Component Date Value Ref Range Status   SARSCOV2ONAVIRUS 2 AG 11/01/2022 NEGATIVE  NEGATIVE Final   Comment: (NOTE) SARS-CoV-2 antigen NOT DETECTED.   Negative results are presumptive.  Negative results do not preclude SARS-CoV-2 infection and should not be used as the sole basis for treatment or other patient management decisions, including infection  control decisions, particularly in the presence of clinical signs and  symptoms  consistent with COVID-19, or in those who have been in contact with the virus.  Negative results must be combined with clinical observations, patient history, and epidemiological information. The expected result is Negative.  Fact Sheet for Patients: HandmadeRecipes.com.cy  Fact Sheet for Healthcare Providers: FuneralLife.at  This test is not yet approved or cleared by the [place] FDA and  has been authorized for detection and/or diagnosis of SARS-CoV-2 by FDA under an Emergency Use Authorization (EUA).  This EUA will remain in effect (meaning this test can be used) for the duration of  the COV                          ID-19 declaration under Section 564(b)(1) of the Act, 21 U.S.C. section 360bbb-3(b)(1), unless the authorization is terminated or revoked sooner.    Admission on 10/07/2022, Discharged on 10/07/2022  Component Date Value Ref Range Status   Color, Urine 10/07/2022 YELLOW  YELLOW Final   APPearance 10/07/2022 HAZY (A)  CLEAR Final   Specific Gravity, Urine 10/07/2022 1.011  1.005 - 1.030 Final   pH 10/07/2022 7.0  5.0 - 8.0 Final   Glucose, UA 10/07/2022 NEGATIVE  NEGATIVE mg/dL Final   Hgb urine dipstick 10/07/2022 SMALL (A)  NEGATIVE Final   Bilirubin Urine 10/07/2022 NEGATIVE  NEGATIVE Final   Ketones, ur 10/07/2022 NEGATIVE  NEGATIVE mg/dL Final   Protein, ur 10/07/2022 NEGATIVE  NEGATIVE mg/dL Final   Nitrite 10/07/2022 NEGATIVE  NEGATIVE Final   Leukocytes,Ua 10/07/2022 LARGE (A)  NEGATIVE Final   RBC / HPF 10/07/2022 0-5  0 - 5 RBC/hpf Final   WBC, UA 10/07/2022 0-5  0 - 5 WBC/hpf Final   Bacteria, UA 10/07/2022 FEW (A)  NONE SEEN Final   Squamous Epithelial / HPF 10/07/2022 11-20  0 - 5 Final   Performed at [facility], [address] [place], [place] [address]   I-stat hCG, quantitative 10/07/2022 <5.0  <5 mIU/mL Final   Comment 3 10/07/2022          Final   Comment:   GEST. AGE      CONC.   (mIU/mL)   <=1 WEEK        5 - 50     2 WEEKS       50 - 500     3 WEEKS       100 - 10,000     4 WEEKS     1,000 - 30,000        FEMALE AND NON-PREGNANT FEMALE:     LESS THAN 5 mIU/mL    Sodium 10/07/2022 138  135 - 145 mmol/L Final   Potassium 10/07/2022 4.3  3.5 - 5.1 mmol/L Final   Chloride 10/07/2022 104  98 - 111 mmol/L Final   CO2 10/07/2022 28  22 - 32 mmol/L Final   Glucose, Bld 10/07/2022 90  70 - 99 mg/dL Final   Glucose reference range applies only to samples taken after fasting for at least 8 hours.   BUN 10/07/2022 8  6 - 20 mg/dL Final   Creatinine, Ser 10/07/2022 0.96  0.44 - 1.00 mg/dL Final   Calcium 10/07/2022 9.1  8.9 - 10.3 mg/dL Final   GFR, Estimated 10/07/2022 >60  >60 mL/min Final   Comment: (NOTE) Calculated using the CKD-EPI Creatinine Equation (2021)    Anion gap 10/07/2022 6  5 - 15 Final   Performed at [facility], [place] [address] [place], [place] [address]   WBC 10/07/2022 5.8  4.0 - 10.5 K/uL Final   RBC 10/07/2022 4.36  3.87 - 5.11 MIL/uL Final   Hemoglobin 10/07/2022 12.9  12.0 - 15.0 g/dL Final   HCT 10/07/2022 40.0  36.0 - 46.0 % Final   MCV 10/07/2022 91.7  80.0 - 100.0 fL Final   MCH 10/07/2022 29.6  26.0 - 34.0 pg Final   MCHC 10/07/2022 32.3  30.0 - 36.0 g/dL Final   RDW 10/07/2022 12.4  11.5 - 15.5 % Final   Platelets 10/07/2022 295  150 - 400 K/uL Final   nRBC 10/07/2022 0.0  0.0 - 0.2 % Final   Performed at [facility] [address], [place],  [address]   SARS Coronavirus 2 by RT PCR 10/07/2022 NEGATIVE  NEGATIVE Final   Comment: (NOTE) SARS-CoV-2 target nucleic acids are NOT DETECTED.  The SARS-CoV-2 RNA is generally detectable in upper respiratory specimens during the acute phase of infection. The lowest concentration of SARS-CoV-2 viral copies this assay can detect is 138 copies/mL. A negative result does not preclude SARS-Cov-2 infection and should not be used as the sole basis for treatment or other  patient management decisions. A negative result may occur with  improper specimen collection/handling, submission of specimen other than nasopharyngeal swab, presence of viral mutation(s) within the areas targeted by this assay, and inadequate number of viral copies(<138 copies/mL). A negative result must be combined with clinical observations, patient history, and epidemiological information. The expected result is Negative.  Fact Sheet for Patients:  EntrepreneurPulse.com.au  Fact Sheet for Healthcare Providers:  IncredibleEmployment.be  This test is no                          t yet approved or cleared by the [place] and  has been authorized for  detection and/or diagnosis of SARS-CoV-2 by FDA under an Emergency Use Authorization (EUA). This EUA will remain  in effect (meaning this test can be used) for the duration of the COVID-19 declaration under Section 564(b)(1) of the Act, 21 U.S.C.section 360bbb-3(b)(1), unless the authorization is terminated  or revoked sooner.       Influenza A by PCR 10/07/2022 NEGATIVE  NEGATIVE Final   Influenza B by PCR 10/07/2022 NEGATIVE  NEGATIVE Final   Comment: (NOTE) The Xpert Xpress SARS-CoV-2/FLU/RSV plus assay is intended as an aid in the diagnosis of influenza from Nasopharyngeal swab specimens and should not be used as a sole basis for treatment. Nasal washings and aspirates are unacceptable for Xpert Xpress SARS-CoV-2/FLU/RSV testing.  Fact Sheet for Patients: EntrepreneurPulse.com.au  Fact Sheet for Healthcare Providers: IncredibleEmployment.be  This test is not yet approved or cleared by the [place] FDA and has been authorized for detection and/or diagnosis of SARS-CoV-2 by FDA under an Emergency Use Authorization (EUA). This EUA will remain in effect (meaning this test can be used) for the duration of the COVID-19 declaration under Section  564(b)(1) of the Act, 21 U.S.C. section 360bbb-3(b)(1), unless the authorization is terminated or revoked.     Resp Syncytial Virus by PCR 10/07/2022 NEGATIVE  NEGATIVE Final   Comment: (NOTE) Fact Sheet for Patients: EntrepreneurPulse.com.au  Fact Sheet for Healthcare Providers: IncredibleEmployment.be  This test is not yet approved or cleared by the [place] FDA and has been authorized for detection and/or diagnosis of SARS-CoV-2 by FDA under an Emergency Use Authorization (EUA). This EUA will remain in effect (meaning this test can be used) for the duration of the COVID-19 declaration under Section 564(b)(1) of the Act, 21 U.S.C. section 360bbb-3(b)(1), unless the authorization is terminated or revoked.  Performed at [facility], [place] [address], [place], [place] [address]    Specimen Description 10/07/2022 URINE, CLEAN CATCH   Final   Special Requests 10/07/2022    Final                   Value:NONE Performed at [facility], [place] [address], [place], [place] [address]    Culture 10/07/2022 MULTIPLE SPECIES PRESENT, SUGGEST RECOLLECTION (A)   Final   Report Status 10/07/2022 10/08/2022 FINAL   Final  Admission on 07/25/2022, Discharged on 10/07/2022  Component Date Value Ref Range Status   SARS Coronavirus 2 by RT PCR 07/25/2022 NEGATIVE  NEGATIVE Final   Comment: (NOTE) SARS-CoV-2 target nucleic acids are NOT DETECTED.  The SARS-CoV-2 RNA is generally detectable in upper respiratory specimens during the acute phase of infection. The lowest concentration of SARS-CoV-2 viral copies this assay can detect is 138 copies/mL. A negative result does not preclude SARS-Cov-2 infection and should not be used as the sole basis for treatment or other patient management decisions. A negative result may occur with  improper specimen collection/handling, submission of specimen other than nasopharyngeal swab, presence of viral  mutation(s) within the areas targeted by this assay, and inadequate number of viral copies(<138 copies/mL). A negative result must be combined with clinical observations, patient history, and epidemiological information. The expected result is Negative.  Fact Sheet for Patients:  EntrepreneurPulse.com.au  Fact Sheet for Healthcare Providers:  IncredibleEmployment.be  This test is no                          t yet approved or cleared by the [place] FDA and  has  been authorized for detection and/or diagnosis of SARS-CoV-2 by FDA under an Emergency Use Authorization (EUA). This EUA will remain  in effect (meaning this test can be used) for the duration of the COVID-19 declaration under Section 564(b)(1) of the Act, 21 U.S.C.section 360bbb-3(b)(1), unless the authorization is terminated  or revoked sooner.       Influenza A by PCR 07/25/2022 NEGATIVE  NEGATIVE Final   Influenza B by PCR 07/25/2022 NEGATIVE  NEGATIVE Final   Comment: (NOTE) The Xpert Xpress SARS-CoV-2/FLU/RSV plus assay is intended as an aid in the diagnosis of influenza from Nasopharyngeal swab specimens and should not be used as a sole basis for treatment. Nasal washings and aspirates are unacceptable for Xpert Xpress SARS-CoV-2/FLU/RSV testing.  Fact Sheet for Patients: EntrepreneurPulse.com.au  Fact Sheet for Healthcare Providers: IncredibleEmployment.be  This test is not yet approved or cleared by the [place] FDA and has been authorized for detection and/or diagnosis of SARS-CoV-2 by FDA under an Emergency Use Authorization (EUA). This EUA will remain in effect (meaning this test can be used) for the duration of the COVID-19 declaration under Section 564(b)(1) of the Act, 21 U.S.C. section 360bbb-3(b)(1), unless the authorization is terminated or revoked.  Performed at [facility], [place] [address] [place],  [place] [address]    WBC 07/25/2022 8.3  4.0 - 10.5 K/uL Final   RBC 07/25/2022 4.44  3.87 - 5.11 MIL/uL Final   Hemoglobin 07/25/2022 13.7  12.0 - 15.0 g/dL Final   HCT 07/25/2022 40.2  36.0 - 46.0 % Final   MCV 07/25/2022 90.5  80.0 - 100.0 fL Final   MCH 07/25/2022 30.9  26.0 - 34.0 pg Final   MCHC 07/25/2022 34.1  30.0 - 36.0 g/dL Final   RDW 07/25/2022 12.2  11.5 - 15.5 % Final   Platelets 07/25/2022 248  150 - 400 K/uL Final   nRBC 07/25/2022 0.0  0.0 - 0.2 % Final   Neutrophils Relative % 07/25/2022 43  % Final   Neutro Abs 07/25/2022 3.6  1.7 - 7.7 K/uL Final   Lymphocytes Relative 07/25/2022 52  % Final   Lymphs Abs 07/25/2022 4.2 (H)  0.7 - 4.0 K/uL Final   Monocytes Relative 07/25/2022 5  % Final   Monocytes Absolute 07/25/2022 0.4  0.1 - 1.0 K/uL Final   Eosinophils Relative 07/25/2022 0  % Final   Eosinophils Absolute 07/25/2022 0.0  0.0 - 0.5 K/uL Final   Basophils Relative 07/25/2022 0  % Final   Basophils Absolute 07/25/2022 0.0  0.0 - 0.1 K/uL Final   Immature Granulocytes 07/25/2022 0  % Final   Abs Immature Granulocytes 07/25/2022 0.02  0.00 - 0.07 K/uL Final   Performed at [facility], [place] [address], [place], [place] [address]   Sodium 07/25/2022 138  135 - 145 mmol/L Final   Potassium 07/25/2022 4.0  3.5 - 5.1 mmol/L Final   Chloride 07/25/2022 104  98 - 111 mmol/L Final   CO2 07/25/2022 29  22 - 32 mmol/L Final   Glucose, Bld 07/25/2022 83  70 - 99 mg/dL Final   Glucose reference range applies only to samples taken after fasting for at least 8 hours.   BUN 07/25/2022 11  6 - 20 mg/dL Final   Creatinine, Ser 07/25/2022 0.97  0.44 - 1.00 mg/dL Final   Calcium 07/25/2022 9.2  8.9 - 10.3 mg/dL Final   Total Protein 07/25/2022 7.0  6.5 - 8.1 g/dL Final   Albumin 07/25/2022  3.8  3.5 - 5.0 g/dL Final   AST 07/25/2022 18  15 - 41 U/L Final   ALT 07/25/2022 22  0 - 44 U/L Final   Alkaline Phosphatase 07/25/2022 64  38 - 126 U/L Final   Total Bilirubin 07/25/2022  0.2 (L)  0.3 - 1.2 mg/dL Final   GFR, Estimated 07/25/2022 >60  >60 mL/min Final   Comment: (NOTE) Calculated using the CKD-EPI Creatinine Equation (2021)    Anion gap 07/25/2022 5  5 - 15 Final   Performed at [facility], [place] [address] [place], [place] [address]   POC Amphetamine UR 07/25/2022 None Detected  NONE DETECTED (Cut Off Level 1000 ng/mL) Preliminary   POC Secobarbital (BAR) 07/25/2022 None Detected  NONE DETECTED (Cut Off Level 300 ng/mL) Preliminary   POC Buprenorphine (BUP) 07/25/2022 None Detected  NONE DETECTED (Cut Off Level 10 ng/mL) Preliminary   POC Oxazepam (BZO) 07/25/2022 None Detected  NONE DETECTED (Cut Off Level 300 ng/mL) Preliminary   POC Cocaine UR 07/25/2022 None Detected  NONE DETECTED (Cut Off Level 300 ng/mL) Preliminary   POC Methamphetamine UR 07/25/2022 None Detected  NONE DETECTED (Cut Off Level 1000 ng/mL) Preliminary   POC Morphine 07/25/2022 None Detected  NONE DETECTED (Cut Off Level 300 ng/mL) Preliminary   POC Methadone UR 07/25/2022 None Detected  NONE DETECTED (Cut Off Level 300 ng/mL) Preliminary   POC Oxycodone UR 07/25/2022 None Detected  NONE DETECTED (Cut Off Level 100 ng/mL) Preliminary   POC Marijuana UR 07/25/2022 None Detected  NONE DETECTED (Cut Off Level 50 ng/mL) Preliminary   SARSCOV2ONAVIRUS 2 AG 07/25/2022 NEGATIVE  NEGATIVE Final   Comment: (NOTE) SARS-CoV-2 antigen NOT DETECTED.   Negative results are presumptive.  Negative results do not preclude SARS-CoV-2 infection and should not be used as the sole basis for treatment or other patient management decisions, including infection  control decisions, particularly in the presence of clinical signs and  symptoms consistent with COVID-19, or in those who have been in contact with the virus.  Negative results must be combined with clinical observations, patient history, and epidemiological information. The expected result is Negative.  Fact Sheet for Patients:  HandmadeRecipes.com.cy  Fact Sheet for Healthcare Providers: FuneralLife.at  This test is not yet approved or cleared by the [place] FDA and  has been authorized for detection and/or diagnosis of SARS-CoV-2 by FDA under an Emergency Use Authorization (EUA).  This EUA will remain in effect (meaning this test can be used) for the duration of  the COV                          ID-19 declaration under Section 564(b)(1) of the Act, 21 U.S.C. section 360bbb-3(b)(1), unless the authorization is terminated or revoked sooner.     Cholesterol 07/25/2022 181  0 - 200 mg/dL Final   Triglycerides 07/25/2022 118  <150 mg/dL Final   HDL 07/25/2022 45  >40 mg/dL Final   Total CHOL/HDL Ratio 07/25/2022 4.0  RATIO Final   VLDL 07/25/2022 24  0 - 40 mg/dL Final   LDL Cholesterol 07/25/2022 112 (H)  0 - 99 mg/dL Final   Comment:        Total Cholesterol/HDL:CHD Risk Coronary Heart Disease Risk Table                     Men   Women  1/2 Average Risk   3.4   3.3  Average Risk  5.0   4.4  2 X Average Risk   9.6   7.1  3 X Average Risk  23.4   11.0        Use the calculated Patient Ratio above and the CHD Risk Table to determine the patient's CHD Risk.        ATP III CLASSIFICATION (LDL):  <100     mg/dL   Optimal  100-129  mg/dL   Near or Above                    Optimal  130-159  mg/dL   Borderline  160-189  mg/dL   High  >190     mg/dL   Very High Performed at [facility] [address] [place], [place] [address]    TSH 07/25/2022 6.668 (H)  0.350 - 4.500 uIU/mL Final   Comment: Performed by a 3rd Generation assay with a functional sensitivity of <=0.01 uIU/mL. Performed at [facility], [place] [address] [place], [place] [address]    Glucose-Capillary 07/26/2022 104 (H)  70 - 99 mg/dL Final   Glucose reference range applies only to samples taken after fasting for at least 8 hours.   T3, Free 07/31/2022 2.3  2.0 - 4.4  pg/mL Final   Comment: (NOTE) Performed At: [facility] [place], [place] [id number] Rush Farmer MD [id number]    Free T4 07/31/2022 0.60 (L)  0.61 - 1.12 ng/dL Final   Comment: (NOTE) Biotin ingestion may interfere with free T4 tests. If the results are inconsistent with the TSH level, previous test results, or the clinical presentation, then consider biotin interference. If needed, order repeat testing after stopping biotin. Performed at [facility], [place] [address], [place], [place] [address]    Glucose-Capillary 08/29/2022 100 (H)  70 - 99 mg/dL Final   Glucose reference range applies only to samples taken after fasting for at least 8 hours.   TSH 09/05/2022 0.793  0.350 - 4.500 uIU/mL Final   Comment: Performed by a 3rd Generation assay with a functional sensitivity of <=0.01 uIU/mL. Performed at [facility], [place] [address], [place], [place] [address]    Free T4 09/05/2022 0.73  0.61 - 1.12 ng/dL Final   Comment: (NOTE) Biotin ingestion may interfere with free T4 tests. If the results are inconsistent with the TSH level, previous test results, or the clinical presentation, then consider biotin interference. If needed, order repeat testing after stopping biotin. Performed at [facility], [place] [address] [place], [place] [address]    Preg Test, Ur 09/06/2022 Negative  Negative Final   Preg Test, Ur 09/06/2022 NEGATIVE  NEGATIVE Final   Comment:        THE SENSITIVITY OF THIS METHODOLOGY IS >24 mIU/mL    Preg Test, Ur 09/05/2022 NEGATIVE  NEGATIVE Final   Comment:        THE SENSITIVITY OF THIS METHODOLOGY IS >24 mIU/mL    Sodium 09/13/2022 136  135 - 145 mmol/L Final   Potassium 09/13/2022 4.5  3.5 - 5.1 mmol/L Final   Chloride 09/13/2022 105  98 - 111 mmol/L Final   CO2 09/13/2022 21 (L)  22 - 32 mmol/L Final   Glucose, Bld 09/13/2022 111 (H)  70 - 99 mg/dL Final   Glucose reference range applies only to samples taken  after fasting for at least 8 hours.   BUN 09/13/2022 14  6 - 20 mg/dL Final  Creatinine, Ser 09/13/2022 0.92  0.44 - 1.00 mg/dL Final   Calcium 09/13/2022 9.1  8.9 - 10.3 mg/dL Final   GFR, Estimated 09/13/2022 >60  >60 mL/min Final   Comment: (NOTE) Calculated using the CKD-EPI Creatinine Equation (2021)    Anion gap 09/13/2022 10  5 - 15 Final   Performed at [facility], [place] [address] [place], [place] [address]   Vitamin B-12 09/13/2022 585  180 - 914 pg/mL Final   Comment: (NOTE) This assay is not validated for testing neonatal or myeloproliferative syndrome specimens for Vitamin B12 levels. Performed at [facility], [place] [address], [place], [place] [address]    Color, Urine 09/14/2022 YELLOW  YELLOW Final   APPearance 09/14/2022 HAZY (A)  CLEAR Final   Specific Gravity, Urine 09/14/2022 1.025  1.005 - 1.030 Final   pH 09/14/2022 5.0  5.0 - 8.0 Final   Glucose, UA 09/14/2022 NEGATIVE  NEGATIVE mg/dL Final   Hgb urine dipstick 09/14/2022 NEGATIVE  NEGATIVE Final   Bilirubin Urine 09/14/2022 NEGATIVE  NEGATIVE Final   Ketones, ur 09/14/2022 NEGATIVE  NEGATIVE mg/dL Final   Protein, ur 09/14/2022 NEGATIVE  NEGATIVE mg/dL Final   Nitrite 09/14/2022 NEGATIVE  NEGATIVE Final   Leukocytes,Ua 09/14/2022 TRACE (A)  NEGATIVE Final   RBC / HPF 09/14/2022 0-5  0 - 5 RBC/hpf Final   WBC, UA 09/14/2022 0-5  0 - 5 WBC/hpf Final   Bacteria, UA 09/14/2022 RARE (A)  NONE SEEN Final   Squamous Epithelial / HPF 09/14/2022 0-5  0 - 5 Final   Mucus 09/14/2022 PRESENT   Final   Performed at [facility], [place] [address] [place], [place] [address]   Glucose-Capillary 09/16/2022 105 (H)  70 - 99 mg/dL Final   Glucose reference range applies only to samples taken after fasting for at least 8 hours.   SARS Coronavirus 2 by RT PCR 09/30/2022 NEGATIVE  NEGATIVE Final   Comment: (NOTE) SARS-CoV-2 target nucleic acids are NOT DETECTED.  The SARS-CoV-2 RNA is generally detectable in  upper respiratory specimens during the acute phase of infection. The lowest concentration of SARS-CoV-2 viral copies this assay can detect is 138 copies/mL. A negative result does not preclude SARS-Cov-2 infection and should not be used as the sole basis for treatment or other patient management decisions. A negative result may occur with  improper specimen collection/handling, submission of specimen other than nasopharyngeal swab, presence of viral mutation(s) within the areas targeted by this assay, and inadequate number of viral copies(<138 copies/mL). A negative result must be combined with clinical observations, patient history, and epidemiological information. The expected result is Negative.  Fact Sheet for Patients:  EntrepreneurPulse.com.au  Fact Sheet for Healthcare Providers:  IncredibleEmployment.be  This test is no                          t yet approved or cleared by the [place] FDA and  has been authorized for detection and/or diagnosis of SARS-CoV-2 by FDA under an Emergency Use Authorization (EUA). This EUA will remain  in effect (meaning this test can be used) for the duration of the COVID-19 declaration under Section 564(b)(1) of the Act, 21 U.S.C.section 360bbb-3(b)(1), unless the authorization is terminated  or revoked sooner.       Influenza A by PCR 09/30/2022 NEGATIVE  NEGATIVE Final   Influenza B by PCR 09/30/2022 NEGATIVE  NEGATIVE Final   Comment: (NOTE) The Xpert  Xpress SARS-CoV-2/FLU/RSV plus assay is intended as an aid in the diagnosis of influenza from Nasopharyngeal swab specimens and should not be used as a sole basis for treatment. Nasal washings and aspirates are unacceptable for Xpert Xpress SARS-CoV-2/FLU/RSV testing.  Fact Sheet for Patients: BloggerCourse.com  Fact Sheet for Healthcare Providers: SeriousBroker.it  This test is not yet approved  or cleared by the [place] FDA and has been authorized for detection and/or diagnosis of SARS-CoV-2 by FDA under an Emergency Use Authorization (EUA). This EUA will remain in effect (meaning this test can be used) for the duration of the COVID-19 declaration under Section 564(b)(1) of the Act, 21 U.S.C. section 360bbb-3(b)(1), unless the authorization is terminated or revoked.     Resp Syncytial Virus by PCR 09/30/2022 NEGATIVE  NEGATIVE Final   Comment: (NOTE) Fact Sheet for Patients: BloggerCourse.com  Fact Sheet for Healthcare Providers: SeriousBroker.it  This test is not yet approved or cleared by the [place] FDA and has been authorized for detection and/or diagnosis of SARS-CoV-2 by FDA under an Emergency Use Authorization (EUA). This EUA will remain in effect (meaning this test can be used) for the duration of the COVID-19 declaration under Section 564(b)(1) of the Act, 21 U.S.C. section 360bbb-3(b)(1), unless the authorization is terminated or revoked.  Performed at [facility], [address], [place], [place] [address]    Sodium 10/01/2022 136  135 - 145 mmol/L Final   Potassium 10/01/2022 3.8  3.5 - 5.1 mmol/L Final   Chloride 10/01/2022 104  98 - 111 mmol/L Final   CO2 10/01/2022 27  22 - 32 mmol/L Final   Glucose, Bld 10/01/2022 98  70 - 99 mg/dL Final   Glucose reference range applies only to samples taken after fasting for at least 8 hours.   BUN 10/01/2022 12  6 - 20 mg/dL Final   Creatinine, Ser 10/01/2022 0.98  0.44 - 1.00 mg/dL Final   Calcium 67/61/9509 8.9  8.9 - 10.3 mg/dL Final   GFR, Estimated 10/01/2022 >60  >60 mL/min Final   Comment: (NOTE) Calculated using the CKD-EPI Creatinine Equation (2021)    Anion gap 10/01/2022 5  5 - 15 Final   Performed at [facility], [address] [place], [place] [address]   WBC 10/01/2022 5.1  4.0 - 10.5 K/uL Final   RBC 10/01/2022 4.31  3.87  - 5.11 MIL/uL Final   Hemoglobin 10/01/2022 12.7  12.0 - 15.0 g/dL Final   HCT 24/58/0998 38.8  36.0 - 46.0 % Final   MCV 10/01/2022 90.0  80.0 - 100.0 fL Final   MCH 10/01/2022 29.5  26.0 - 34.0 pg Final   MCHC 10/01/2022 32.7  30.0 - 36.0 g/dL Final   RDW 33/82/5053 12.4  11.5 - 15.5 % Final   Platelets 10/01/2022 300  150 - 400 K/uL Final   nRBC 10/01/2022 0.0  0.0 - 0.2 % Final   Performed at [facility], [address], [place], [place] [address]   Lipase 10/01/2022 29  11 - 51 U/L Final   Performed at [facility], [address] [place], [place] [address]   Color, Urine 10/05/2022 YELLOW  YELLOW Final   APPearance 10/05/2022 HAZY (A)  CLEAR Final   Specific Gravity, Urine 10/05/2022 1.018  1.005 - 1.030 Final   pH 10/05/2022 5.0  5.0 - 8.0 Final   Glucose, UA 10/05/2022 NEGATIVE  NEGATIVE mg/dL Final   Hgb urine dipstick 10/05/2022 NEGATIVE  NEGATIVE Final   Bilirubin Urine 10/05/2022 NEGATIVE  NEGATIVE Final   Ketones, ur 10/05/2022 NEGATIVE  NEGATIVE mg/dL Final   Protein, ur 10/05/2022 NEGATIVE  NEGATIVE mg/dL Final   Nitrite 10/05/2022 NEGATIVE  NEGATIVE Final   Leukocytes,Ua 10/05/2022 TRACE (A)  NEGATIVE Final   RBC / HPF 10/05/2022 0-5  0 - 5 RBC/hpf Final   WBC, UA 10/05/2022 0-5  0 - 5 WBC/hpf Final   Bacteria, UA 10/05/2022 RARE (A)  NONE SEEN Final   Squamous Epithelial / HPF 10/05/2022 0-5  0 - 5 Final   Mucus 10/05/2022 PRESENT   Final   Performed at [facility], [place] [address] [place], [place] [address]   Specimen Description 10/05/2022 URINE, CLEAN CATCH   Final   Special Requests 10/05/2022    Final                   Value:NONE Performed at [facility], [place] [address], [place], [place] [address]    Culture 10/05/2022 10,000 COLONIES/mL MULTIPLE SPECIES PRESENT, SUGGEST RECOLLECTION (A)   Final   Report Status 10/05/2022 10/07/2022 FINAL   Final  Admission on 07/04/2022, Discharged on 07/05/2022  Component Date Value Ref Range  Status   Sodium 07/04/2022 142  135 - 145 mmol/L Final   Potassium 07/04/2022 4.4  3.5 - 5.1 mmol/L Final   Chloride 07/04/2022 109  98 - 111 mmol/L Final   CO2 07/04/2022 26  22 - 32 mmol/L Final   Glucose, Bld 07/04/2022 96  70 - 99 mg/dL Final   Glucose reference range applies only to samples taken after fasting for at least 8 hours.   BUN 07/04/2022 15  6 - 20 mg/dL Final   Creatinine, Ser 07/04/2022 0.83  0.44 - 1.00 mg/dL Final   Calcium 07/04/2022 9.3  8.9 - 10.3 mg/dL Final   Total Protein 07/04/2022 7.2  6.5 - 8.1 g/dL Final   Albumin 07/04/2022 3.9  3.5 - 5.0 g/dL Final   AST 07/04/2022 23  15 - 41 U/L Final   ALT 07/04/2022 28  0 - 44 U/L Final   Alkaline Phosphatase 07/04/2022 77  38 - 126 U/L Final   Total Bilirubin 07/04/2022 0.4  0.3 - 1.2 mg/dL Final   GFR, Estimated 07/04/2022 >60  >60 mL/min Final   Comment: (NOTE) Calculated using the CKD-EPI Creatinine Equation (2021)    Anion gap 07/04/2022 7  5 - 15 Final   Performed at [facility], [place] [address], [place], [place] [address]   Alcohol, Ethyl (B) 07/04/2022 <10  <10 mg/dL Final   Comment: (NOTE) Lowest detectable limit for serum alcohol is 10 mg/dL.  For medical purposes only. Performed at [facility], [place] [address], [place], [place] [address]    WBC 07/04/2022 7.0  4.0 - 10.5 K/uL Final   RBC 07/04/2022 4.18  3.87 - 5.11 MIL/uL Final   Hemoglobin 07/04/2022 12.8  12.0 - 15.0 g/dL Final   HCT 07/04/2022 39.1  36.0 - 46.0 % Final   MCV 07/04/2022 93.5  80.0 - 100.0 fL Final   MCH 07/04/2022 30.6  26.0 - 34.0 pg Final   MCHC 07/04/2022 32.7  30.0 - 36.0 g/dL Final   RDW 07/04/2022 12.9  11.5 - 15.5 % Final   Platelets 07/04/2022 243  150 - 400 K/uL Final   nRBC 07/04/2022 0.0  0.0 - 0.2 % Final   Neutrophils Relative % 07/04/2022 43  % Final  Neutro Abs 07/04/2022 3.0  1.7 - 7.7 K/uL Final   Lymphocytes Relative 07/04/2022 50  % Final   Lymphs Abs 07/04/2022 3.5   0.7 - 4.0 K/uL Final   Monocytes Relative 07/04/2022 7  % Final   Monocytes Absolute 07/04/2022 0.5  0.1 - 1.0 K/uL Final   Eosinophils Relative 07/04/2022 0  % Final   Eosinophils Absolute 07/04/2022 0.0  0.0 - 0.5 K/uL Final   Basophils Relative 07/04/2022 0  % Final   Basophils Absolute 07/04/2022 0.0  0.0 - 0.1 K/uL Final   Immature Granulocytes 07/04/2022 0  % Final   Abs Immature Granulocytes 07/04/2022 0.01  0.00 - 0.07 K/uL Final   Performed at [facility], [place] [address] [place], [place] [address]   I-stat hCG, quantitative 07/04/2022 <5.0  <5 mIU/mL Final   Comment 3 07/04/2022          Final   Comment:   GEST. AGE      CONC.  (mIU/mL)   <=1 WEEK        5 - 50     2 WEEKS       50 - 500     3 WEEKS       100 - 10,000     4 WEEKS     1,000 - 30,000        FEMALE AND NON-PREGNANT FEMALE:     LESS THAN 5 mIU/mL   Admission on 06/14/2022, Discharged on 06/14/2022  Component Date Value Ref Range Status   Color, UA 06/14/2022 yellow  yellow Final   Clarity, UA 06/14/2022 cloudy (A)  clear Final   Glucose, UA 06/14/2022 negative  negative mg/dL Final   Bilirubin, UA 06/14/2022 negative  negative Final   Ketones, POC UA 06/14/2022 negative  negative mg/dL Final   Spec Grav, UA 06/14/2022 1.025  1.010 - 1.025 Final   Blood, UA 06/14/2022 negative  negative Final   pH, UA 06/14/2022 7.5  5.0 - 8.0 Final   Protein Ur, POC 06/14/2022 =30 (A)  negative mg/dL Final   Urobilinogen, UA 06/14/2022 0.2  0.2 or 1.0 E.U./dL Final   Nitrite, UA 06/14/2022 Negative  Negative Final   Leukocytes, UA 06/14/2022 Negative  Negative Final   Preg Test, Ur 06/14/2022 Negative  Negative Final   Specimen Description 06/14/2022 URINE, CLEAN CATCH   Final   Special Requests 06/14/2022    Final                   Value:NONE Performed at [facility], [place] [address], [place], [place] [address]    Culture 06/14/2022 MULTIPLE SPECIES PRESENT, SUGGEST RECOLLECTION (A)   Final    Report Status 06/14/2022 06/16/2022 FINAL   Final  Admission on 06/07/2022, Discharged on 06/10/2022  Component Date Value Ref Range Status   SARS Coronavirus 2 by RT PCR 06/07/2022 NEGATIVE  NEGATIVE Final   Comment: (NOTE) SARS-CoV-2 target nucleic acids are NOT DETECTED.  The SARS-CoV-2 RNA is generally detectable in upper respiratory specimens during the acute phase of infection. The lowest concentration of SARS-CoV-2 viral copies this assay can detect is 138 copies/mL. A negative result does not preclude SARS-Cov-2 infection and should not be used as the sole basis for treatment or other patient management decisions. A negative result may occur with  improper specimen collection/handling, submission of specimen other than nasopharyngeal swab, presence of viral mutation(s) within the areas targeted by this assay, and inadequate number of viral  copies(<138 copies/mL). A negative result must be combined with clinical observations, patient history, and epidemiological information. The expected result is Negative.  Fact Sheet for Patients:  EntrepreneurPulse.com.au  Fact Sheet for Healthcare Providers:  IncredibleEmployment.be  This test is no                          t yet approved or cleared by the [place] FDA and  has been authorized for detection and/or diagnosis of SARS-CoV-2 by FDA under an Emergency Use Authorization (EUA). This EUA will remain  in effect (meaning this test can be used) for the duration of the COVID-19 declaration under Section 564(b)(1) of the Act, 21 U.S.C.section 360bbb-3(b)(1), unless the authorization is terminated  or revoked sooner.       Influenza A by PCR 06/07/2022 NEGATIVE  NEGATIVE Final   Influenza B by PCR 06/07/2022 NEGATIVE  NEGATIVE Final   Comment: (NOTE) The Xpert Xpress SARS-CoV-2/FLU/RSV plus assay is intended as an aid in the diagnosis of influenza from Nasopharyngeal swab specimens  and should not be used as a sole basis for treatment. Nasal washings and aspirates are unacceptable for Xpert Xpress SARS-CoV-2/FLU/RSV testing.  Fact Sheet for Patients: EntrepreneurPulse.com.au  Fact Sheet for Healthcare Providers: IncredibleEmployment.be  This test is not yet approved or cleared by the [place] FDA and has been authorized for detection and/or diagnosis of SARS-CoV-2 by FDA under an Emergency Use Authorization (EUA). This EUA will remain in effect (meaning this test can be used) for the duration of the COVID-19 declaration under Section 564(b)(1) of the Act, 21 U.S.C. section 360bbb-3(b)(1), unless the authorization is terminated or revoked.  Performed at [facility], [place] [address] [place], [place] [address]    WBC 06/07/2022 5.7  4.0 - 10.5 K/uL Final   RBC 06/07/2022 4.39  3.87 - 5.11 MIL/uL Final   Hemoglobin 06/07/2022 13.2  12.0 - 15.0 g/dL Final   HCT 06/07/2022 40.4  36.0 - 46.0 % Final   MCV 06/07/2022 92.0  80.0 - 100.0 fL Final   MCH 06/07/2022 30.1  26.0 - 34.0 pg Final   MCHC 06/07/2022 32.7  30.0 - 36.0 g/dL Final   RDW 06/07/2022 12.4  11.5 - 15.5 % Final   Platelets 06/07/2022 308  150 - 400 K/uL Final   nRBC 06/07/2022 0.0  0.0 - 0.2 % Final   Neutrophils Relative % 06/07/2022 42  % Final   Neutro Abs 06/07/2022 2.4  1.7 - 7.7 K/uL Final   Lymphocytes Relative 06/07/2022 54  % Final   Lymphs Abs 06/07/2022 3.1  0.7 - 4.0 K/uL Final   Monocytes Relative 06/07/2022 4  % Final   Monocytes Absolute 06/07/2022 0.2  0.1 - 1.0 K/uL Final   Eosinophils Relative 06/07/2022 0  % Final   Eosinophils Absolute 06/07/2022 0.0  0.0 - 0.5 K/uL Final   Basophils Relative 06/07/2022 0  % Final   Basophils Absolute 06/07/2022 0.0  0.0 - 0.1 K/uL Final   Immature Granulocytes 06/07/2022 0  % Final   Abs Immature Granulocytes 06/07/2022 0.01  0.00 - 0.07 K/uL Final   Performed at [facility], [place] [address] [place], [place] [address]   Sodium 06/07/2022 139  135 - 145 mmol/L Final   Potassium 06/07/2022 4.0  3.5 - 5.1 mmol/L Final   Chloride 06/07/2022 104  98 - 111 mmol/L Final   CO2 06/07/2022 28  22 - 32 mmol/L Final  Glucose, Bld 06/07/2022 104 (H)  70 - 99 mg/dL Final   Glucose reference range applies only to samples taken after fasting for at least 8 hours.   BUN 06/07/2022 8  6 - 20 mg/dL Final   Creatinine, Ser 06/07/2022 0.84  0.44 - 1.00 mg/dL Final   Calcium 06/07/2022 9.1  8.9 - 10.3 mg/dL Final   Total Protein 06/07/2022 6.9  6.5 - 8.1 g/dL Final   Albumin 06/07/2022 3.7  3.5 - 5.0 g/dL Final   AST 06/07/2022 19  15 - 41 U/L Final   ALT 06/07/2022 22  0 - 44 U/L Final   Alkaline Phosphatase 06/07/2022 54  38 - 126 U/L Final   Total Bilirubin 06/07/2022 0.4  0.3 - 1.2 mg/dL Final   GFR, Estimated 06/07/2022 >60  >60 mL/min Final   Comment: (NOTE) Calculated using the CKD-EPI Creatinine Equation (2021)    Anion gap 06/07/2022 7  5 - 15 Final   Performed at [place] [address] [place], [place] [address]   Hgb A1c MFr Bld 06/07/2022 5.0  4.8 - 5.6 % Final   Comment: (NOTE) Pre diabetes:          5.7%-6.4%  Diabetes:              >6.4%  Glycemic control for   <7.0% adults with diabetes    Mean Plasma Glucose 06/07/2022 96.8  mg/dL Final   Performed at [facility], [place] [address] [place], [place] [address]   TSH 06/07/2022 1.620  0.350 - 4.500 uIU/mL Final   Comment: Performed by a 3rd Generation assay with a functional sensitivity of <=0.01 uIU/mL. Performed at [facility], [place] [address], [place], [place] [address]    RPR Ser Ql 06/07/2022 NON REACTIVE  NON REACTIVE Final   Performed at [facility], [place] [address], [place], [place] [address]   Color, Urine 06/07/2022 YELLOW  YELLOW Final   APPearance 06/07/2022 HAZY (A)  CLEAR Final   Specific Gravity, Urine 06/07/2022 1.018  1.005 - 1.030 Final   pH 06/07/2022 7.0  5.0 -  8.0 Final   Glucose, UA 06/07/2022 NEGATIVE  NEGATIVE mg/dL Final   Hgb urine dipstick 06/07/2022 NEGATIVE  NEGATIVE Final   Bilirubin Urine 06/07/2022 NEGATIVE  NEGATIVE Final   Ketones, ur 06/07/2022 NEGATIVE  NEGATIVE mg/dL Final   Protein, ur 06/07/2022 NEGATIVE  NEGATIVE mg/dL Final   Nitrite 06/07/2022 NEGATIVE  NEGATIVE Final   Leukocytes,Ua 06/07/2022 NEGATIVE  NEGATIVE Final   Performed at [facility], [place] [address] [place], [place] [address]   Cholesterol 06/07/2022 164  0 - 200 mg/dL Final   Triglycerides 06/07/2022 158 (H)  <150 mg/dL Final   HDL 06/07/2022 44  >40 mg/dL Final   Total CHOL/HDL Ratio 06/07/2022 3.7  RATIO Final   VLDL 06/07/2022 32  0 - 40 mg/dL Final   LDL Cholesterol 06/07/2022 88  0 - 99 mg/dL Final   Comment:        Total Cholesterol/HDL:CHD Risk Coronary Heart Disease Risk Table                     Men   Women  1/2 Average Risk   3.4   3.3  Average Risk       5.0   4.4  2 X Average Risk   9.6   7.1  3 X Average Risk  23.4   11.0  Use the calculated Patient Ratio above and the CHD Risk Table to determine the patient's CHD Risk.        ATP III CLASSIFICATION (LDL):  <100     mg/dL   Optimal  100-129  mg/dL   Near or Above                    Optimal  130-159  mg/dL   Borderline  160-189  mg/dL   High  >190     mg/dL   Very High Performed at [place] [address], [place], [place] [address]    HIV Screen 4th Generation wRfx 06/07/2022 Non Reactive  Non Reactive Final   Performed at [facility], [place] [address] [place], [place] [address]   SARSCOV2ONAVIRUS 2 AG 06/07/2022 NEGATIVE  NEGATIVE Final   Comment: (NOTE) SARS-CoV-2 antigen NOT DETECTED.   Negative results are presumptive.  Negative results do not preclude SARS-CoV-2 infection and should not be used as the sole basis for treatment or other patient management decisions, including infection  control decisions, particularly in the presence of clinical  signs and  symptoms consistent with COVID-19, or in those who have been in contact with the virus.  Negative results must be combined with clinical observations, patient history, and epidemiological information. The expected result is Negative.  Fact Sheet for Patients: HandmadeRecipes.com.cy  Fact Sheet for Healthcare Providers: FuneralLife.at  This test is not yet approved or cleared by the [place] FDA and  has been authorized for detection and/or diagnosis of SARS-CoV-2 by FDA under an Emergency Use Authorization (EUA).  This EUA will remain in effect (meaning this test can be used) for the duration of  the COV                          ID-19 declaration under Section 564(b)(1) of the Act, 21 U.S.C. section 360bbb-3(b)(1), unless the authorization is terminated or revoked sooner.     POC Amphetamine UR 06/07/2022 None Detected  NONE DETECTED (Cut Off Level 1000 ng/mL) Final   POC Secobarbital (BAR) 06/07/2022 None Detected  NONE DETECTED (Cut Off Level 300 ng/mL) Final   POC Buprenorphine (BUP) 06/07/2022 None Detected  NONE DETECTED (Cut Off Level 10 ng/mL) Final   POC Oxazepam (BZO) 06/07/2022 None Detected  NONE DETECTED (Cut Off Level 300 ng/mL) Final   POC Cocaine UR 06/07/2022 None Detected  NONE DETECTED (Cut Off Level 300 ng/mL) Final   POC Methamphetamine UR 06/07/2022 None Detected  NONE DETECTED (Cut Off Level 1000 ng/mL) Final   POC Morphine 06/07/2022 None Detected  NONE DETECTED (Cut Off Level 300 ng/mL) Final   POC Methadone UR 06/07/2022 None Detected  NONE DETECTED (Cut Off Level 300 ng/mL) Final   POC Oxycodone UR 06/07/2022 None Detected  NONE DETECTED (Cut Off Level 100 ng/mL) Final   POC Marijuana UR 06/07/2022 None Detected  NONE DETECTED (Cut Off Level 50 ng/mL) Final   Preg Test, Ur 06/08/2022 NEGATIVE  NEGATIVE Final   Comment:        THE SENSITIVITY OF THIS METHODOLOGY IS >20 mIU/mL. Performed at [facility], [place] [address], [place], [place] [address]    Preg Test, Ur 06/08/2022 NEGATIVE  NEGATIVE Final   Comment:        THE SENSITIVITY OF THIS METHODOLOGY IS >24 mIU/mL     Blood Alcohol level:  Lab Results  Component Value Date  ETH <10 07/04/2022   ETH <10 [id number]    Metabolic Disorder Labs: Lab Results  Component Value Date   HGBA1C 5.7 (H) 10/15/2022   MPG 117 10/15/2022   MPG 96.8 06/07/2022   No results found for: "PROLACTIN" Lab Results  Component Value Date   CHOL 181 07/25/2022   TRIG 118 07/25/2022   HDL 45 07/25/2022   CHOLHDL 4.0 07/25/2022   VLDL 24 07/25/2022   LDLCALC 112 (H) 07/25/2022   LDLCALC 88 06/07/2022    Therapeutic Lab Levels: No results found for: "LITHIUM" No results found for: "VALPROATE" No results found for: "CBMZ"  Physical Findings   GAD-7    [place] Office Visit from 12/17/2021 in [facility] for [place] at [place] [facility] from 10/07/2022 in [facility] ED from 06/07/2022 in [facility] Office Visit from 12/17/2021 in [facility] for [place] at [facility] Total Score 0 1 2  PHQ-9 Total Score -- 2 [place] ED from 10/07/2022 in [facility] Most recent reading at 11/07/2022  9:32 AM ED from 10/07/2022 in [facility] Emergency Department at [facility] Most recent reading at 10/07/2022 12:18 PM ED from 07/25/2022 in [facility] Most recent reading at 10/06/2022  9:38 AM  C-SSRS RISK CATEGORY No Risk No Risk No Risk        Musculoskeletal  Strength & Muscle Tone: within normal limits Gait & Station: normal Patient leans: N/A  Psychiatric Specialty Exam  Presentation  General Appearance:  Appropriate for Environment; Casual  Eye Contact: Good  Speech: Clear and Coherent; Normal  Rate  Speech Volume: Normal  Handedness: Right   Mood and Affect  Mood: Euthymic  Affect: Flat   Thought Process  Thought Processes: Coherent  Descriptions of Associations:Intact  Orientation:Full (Time, Place and Person)  Thought Content:Logical  Diagnosis of Schizophrenia or Schizoaffective disorder in past: Yes  Duration of Psychotic Symptoms: Greater than six months   Hallucinations:Hallucinations: None  Ideas of Reference:None  Suicidal Thoughts:Suicidal Thoughts: No  Homicidal Thoughts:Homicidal Thoughts: No   Sensorium  Memory: Immediate Good  Judgment: Intact  Insight: Present   Executive Functions  Concentration: Good  Attention Span: Good  Recall: Good  Fund of Knowledge: Good  Language: Good   Psychomotor Activity  Psychomotor Activity: Psychomotor Activity: Normal   Assets  Assets: Communication Skills; Desire for Improvement; Financial Resources/Insurance; Leisure Time; Physical Health; Resilience; Social Support   Sleep  Sleep: Sleep: Good   No data recorded  Physical Exam  Physical Exam Constitutional:      General: She is not in acute distress.    Appearance: She is not ill-appearing, toxic-appearing or diaphoretic.  Eyes:     General: No scleral icterus. Cardiovascular:     Rate and Rhythm: Normal rate.  Pulmonary:     Effort: Pulmonary effort is normal. No respiratory distress.  Neurological:     Mental Status: She is alert and oriented to person, place, and time.  Psychiatric:        Attention and Perception: Attention and perception normal.        Mood and Affect: Mood normal. Affect is flat.        Speech: Speech normal.        Behavior: Behavior normal. Behavior is cooperative.  Thought Content: Thought content normal.    Review of Systems  Constitutional:  Negative for chills and fever.  Respiratory:  Negative for shortness of breath.   Cardiovascular:  Negative for chest pain and  palpitations.  Gastrointestinal:  Negative for abdominal pain.  Neurological:  Negative for headaches.   Blood pressure 95/65, pulse 98, temperature 98.2 F (36.8 C), temperature source Oral, resp. rate 18, height 5\' 1"  (1.549 m), weight 198 lb (89.8 kg), SpO2 98 %. Body mass index is 37.41 kg/m.  Treatment Plan Summary: Plan Pt remains psychiatrically cleared. Possible discharge Saturday to transitional living facility  Tharon Aquas, NP 11/20/2022 11:28 AM

## 2022-11-20 NOTE — Care Management (Addendum)
OBS Care Management   Writer coordinated for the patient to have a follow up medication management and OPT appointment virtually at [facility] on 12-13-2022 at [address] and 10:30am.  Writer coordinated with the NP Kennyth Lose so that the patient will be discharged with a 7 day sample supply of prescription medication.   A written prescription will also be given to the facility.  The pharmacy that the patient will be utilizing is the [facility] on [address] in [place], [place].

## 2022-11-21 DIAGNOSIS — F333 Major depressive disorder, recurrent, severe with psychotic symptoms: Secondary | ICD-10-CM | POA: Diagnosis not present

## 2022-11-21 DIAGNOSIS — F209 Schizophrenia, unspecified: Secondary | ICD-10-CM | POA: Diagnosis not present

## 2022-11-21 DIAGNOSIS — F17200 Nicotine dependence, unspecified, uncomplicated: Secondary | ICD-10-CM | POA: Diagnosis not present

## 2022-11-21 DIAGNOSIS — E118 Type 2 diabetes mellitus with unspecified complications: Secondary | ICD-10-CM | POA: Diagnosis not present

## 2022-11-21 DIAGNOSIS — N9489 Other specified conditions associated with female genital organs and menstrual cycle: Secondary | ICD-10-CM | POA: Diagnosis not present

## 2022-11-21 DIAGNOSIS — E039 Hypothyroidism, unspecified: Secondary | ICD-10-CM | POA: Diagnosis not present

## 2022-11-21 DIAGNOSIS — Z1152 Encounter for screening for COVID-19: Secondary | ICD-10-CM | POA: Diagnosis not present

## 2022-11-21 DIAGNOSIS — N390 Urinary tract infection, site not specified: Secondary | ICD-10-CM | POA: Diagnosis not present

## 2022-11-21 DIAGNOSIS — F419 Anxiety disorder, unspecified: Secondary | ICD-10-CM | POA: Diagnosis not present

## 2022-11-21 NOTE — ED Notes (Signed)
Patient resting quietly in bed with eyes closed. Respirations equal and unlabored, skin warm and dry, NAD. Routine safety checks conducted according to facility protocol. Will continue to monitor for safety.  

## 2022-11-21 NOTE — ED Notes (Signed)
Pt resting at this hour. No apparent distress. RR even and unlabored. Monitored for safety.

## 2022-11-21 NOTE — ED Notes (Signed)
[name]&Ox4. Denies intent to harm self/others when asked. Denies A/VH. Patient denies any physical complaints when asked. No acute distress noted. Support and encouragement provided. Routine safety checks conducted according to facility protocol. Encouraged patient to notify staff if thoughts of harm toward self or others arise. Patient verbalize understanding and agreement. Will continue to monitor for safety.    

## 2022-11-21 NOTE — ED Provider Notes (Signed)
Behavioral Health Progress Note  Date and Time: 11/21/2022 8:44 AM Name: Nicole [name] MRN:  [id number]  Mazelle Huebert is a 32 y/o female with PMH of SCZ unspecified, MDD with psychosis, who presented voluntarily to [facility] (07/23/2022) via [facility] after a verbal altercation with [name] (not patient's legal guardian) for transient SI. This is patient's fourth visit to [facility] for similar concerns this year. Patient has been dismissed from previous group home due to threatening SI and HI, then leaving the group home. 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     Patient seen face-to-face by this provider and chart reviewed on 11/21/2022  Subjective:    On reassessment, pt is sleeping, awakens to name being called. She reports euthymic mood. She denies suicidal, homicidal or violent ideations. She denies auditory visual hallucinations or paranoia. Reviewed pending discharge on Saturday to transitional housing. Pt reports she is looking forward to discharge. There is no evidence of agitation, aggression, distractibility or internal preoccupation. No delusions or paranoia elicited.   Diagnosis:  Final diagnoses:  Tobacco use disorder  Schizophrenia, unspecified type (HCC)  MDD (major depressive disorder), recurrent episode, moderate (HCC)  Hypothyroidism, unspecified type    Total Time spent with patient: 15 minutes  Past Psychiatric History: SCZ unspecified, MDD with psychosis, anxiety, depression, tobacco use Past Medical History: Hypothyroidism Family History: Hypertension - father diabetes Family Psychiatric  History: None reported Social History: Currently seeking placement  Additional Social History:                         Sleep: Good  Appetite:  Good  Current Medications:  Current Facility-Administered Medications  Medication Dose Route Frequency Provider Last Rate Last Admin   acetaminophen (TYLENOL) tablet 650 mg  650 mg Oral Q6H PRN Marlou Sa, NP   650 mg at 11/18/22 2114   alum & mag hydroxide-simeth (MAALOX/MYLANTA) 200-200-20 MG/5ML suspension 30 mL  30 mL Oral Q4H PRN Marlou Sa, NP   30 mL at 11/16/22 1849   ARIPiprazole ER (ABILIFY MAINTENA) 400 MG prefilled syringe 400 mg  400 mg Intramuscular Q28 days Princess Bruins, DO   400 mg at 10/25/22 0920   cetaphil lotion   Topical PRN Princess Bruins, DO       fluticasone (FLONASE) 50 MCG/ACT nasal spray 1 spray  1 spray Each Nare QHS Lauree Chandler, NP   1 spray at 11/20/22 2208   hydrocortisone cream 1 %   Topical QID PRN Onuoha, Chinwendu V, NP   Given at 11/14/22 1751   hydrOXYzine (ATARAX) tablet 25 mg  25 mg Oral TID PRN Lorri Frederick, MD   25 mg at 11/19/22 2152   ketoconazole (NIZORAL) 2 % cream   Topical BID Princess Bruins, DO   Given at 11/20/22 2207   levothyroxine (SYNTHROID) tablet 100 mcg  100 mcg Oral Once Byungura, Veronique M, NP       levothyroxine (SYNTHROID) tablet 100 mcg  100 mcg Oral Q0600 Marlou Sa, NP   100 mcg at 11/21/22 0608   lidocaine (LIDODERM) 5 % 1 patch  1 patch Transdermal Q24H Princess Bruins, DO   1 patch at 11/20/22 0946   loratadine (CLARITIN) tablet 10 mg  10 mg Oral Daily Carrion-Carrero, Karle Starch, MD   10 mg at 11/20/22 0918   magnesium hydroxide (MILK OF MAGNESIA) suspension 30 mL  30 mL Oral Daily PRN Marlou Sa, NP   30 mL at  10/12/22 2145   metFORMIN (GLUCOPHAGE) tablet 500 mg  500 mg Oral Q breakfast Carrion-Carrero, Margely, MD   500 mg at 11/21/22 1610   nicotine (NICODERM CQ - dosed in mg/24 hours) patch 14 mg  14 mg Transdermal Daily PRN Carrion-Carrero, Karle Starch, MD       ondansetron (ZOFRAN-ODT) disintegrating tablet 4 mg  4 mg Oral Q8H PRN Carrion-Carrero, Margely, MD   4 mg at 11/16/22 1849   Oxcarbazepine (TRILEPTAL) tablet 300 mg  300 mg Oral BID Byungura, Veronique M, NP   300 mg at 11/20/22 2207   pantoprazole (PROTONIX) EC tablet 40 mg  40 mg Oral Daily Carrion-Carrero,  Margely, MD   40 mg at 11/20/22 0919   polyethylene glycol (MIRALAX / GLYCOLAX) packet 17 g  17 g Oral Daily Princess Bruins, DO   17 g at 11/20/22 0916   QUEtiapine (SEROQUEL) tablet 400 mg  400 mg Oral BID Rayburn Go, Veronique M, NP   400 mg at 11/20/22 2207   sertraline (ZOLOFT) tablet 150 mg  150 mg Oral Daily Carrion-Carrero, Margely, MD   150 mg at 11/20/22 0918   sodium chloride (OCEAN) 0.65 % nasal spray 1 spray  1 spray Each Nare Daily Princess Bruins, DO   1 spray at 11/20/22 0916   traZODone (DESYREL) tablet 100 mg  100 mg Oral QHS Olin Pia M, NP   100 mg at 11/20/22 2208   traZODone (DESYREL) tablet 50 mg  50 mg Oral QHS PRN Marlou Sa, NP   50 mg at 11/20/22 2207   valACYclovir (VALTREX) tablet 500 mg  500 mg Oral Daily Carrion-Carrero, Karle Starch, MD   500 mg at 11/20/22 0918   zinc oxide (BALMEX) 11.3 % cream   Topical PRN Princess Bruins, DO       Current Outpatient Medications  Medication Sig Dispense Refill   ABILIFY MAINTENA 400 MG PRSY prefilled syringe 400 mg every 28 (twenty-eight) days.     cetirizine (ZYRTEC) 10 MG tablet Take 10 mg by mouth daily.     cyclobenzaprine (FLEXERIL) 10 MG tablet Take 1 tablet (10 mg total) by mouth 2 (two) times daily as needed for muscle spasms. 20 tablet 0   fluticasone (FLONASE) 50 MCG/ACT nasal spray Place 1 spray into both nostrils daily.     meloxicam (MOBIC) 15 MG tablet Take 15 mg by mouth daily.     nitrofurantoin, macrocrystal-monohydrate, (MACROBID) 100 MG capsule Take 1 capsule (100 mg total) by mouth 2 (two) times daily. 10 capsule 0   ondansetron (ZOFRAN-ODT) 4 MG disintegrating tablet Take 1 tablet (4 mg total) by mouth every 8 (eight) hours as needed for nausea or vomiting. 20 tablet 0   Oxcarbazepine (TRILEPTAL) 300 MG tablet Take 1 tablet (300 mg total) by mouth 2 (two) times daily. 60 tablet 0   QUEtiapine (SEROQUEL) 400 MG tablet Take 1 tablet (400 mg total) by mouth 2 (two) times daily. 60 tablet 0    sertraline (ZOLOFT) 50 MG tablet Take 3 tablets (150 mg total) by mouth in the morning. 90 tablet 0   traZODone (DESYREL) 100 MG tablet Take 1 tablet (100 mg total) by mouth at bedtime. 30 tablet 0   valACYclovir (VALTREX) 500 MG tablet Take 500 mg by mouth daily.      Labs  Lab Results:  Admission on 10/07/2022  Component Date Value Ref Range Status   Hgb A1c MFr Bld 10/15/2022 5.7 (H)  4.8 - 5.6 % Final   Comment: (NOTE)  Prediabetes: 5.7 - 6.4         Diabetes: >6.4         Glycemic control for adults with diabetes: <7.0    Mean Plasma Glucose 10/15/2022 117  mg/dL Final   Comment: (NOTE) Performed At: [facility] [address] [place], [place] [id number] Jolene Schimke MD TO:[id number]    Color, Urine 11/13/2022 YELLOW  YELLOW Final   APPearance 11/13/2022 CLOUDY (A)  CLEAR Final   Specific Gravity, Urine 11/13/2022 1.023  1.005 - 1.030 Final   pH 11/13/2022 6.0  5.0 - 8.0 Final   Glucose, UA 11/13/2022 NEGATIVE  NEGATIVE mg/dL Final   Hgb urine dipstick 11/13/2022 NEGATIVE  NEGATIVE Final   Bilirubin Urine 11/13/2022 NEGATIVE  NEGATIVE Final   Ketones, ur 11/13/2022 NEGATIVE  NEGATIVE mg/dL Final   Protein, ur 83/38/2505 NEGATIVE  NEGATIVE mg/dL Final   Nitrite 39/76/7341 NEGATIVE  NEGATIVE Final   Leukocytes,Ua 11/13/2022 NEGATIVE  NEGATIVE Final   Performed at [facility], [address], [place], [place] [address]  Office Visit on 10/21/2022  Component Date Value Ref Range Status   SARSCOV2ONAVIRUS 2 AG 11/01/2022 NEGATIVE  NEGATIVE Final   Comment: (NOTE) SARS-CoV-2 antigen NOT DETECTED.   Negative results are presumptive.  Negative results do not preclude SARS-CoV-2 infection and should not be used as the sole basis for treatment or other patient management decisions, including infection  control decisions, particularly in the presence of clinical signs and  symptoms consistent with COVID-19, or in those who have been in contact with the  virus.  Negative results must be combined with clinical observations, patient history, and epidemiological information. The expected result is Negative.  Fact Sheet for Patients: https://www.jennings-kim.com/  Fact Sheet for Healthcare Providers: https://alexander-rogers.biz/  This test is not yet approved or cleared by the [place] FDA and  has been authorized for detection and/or diagnosis of SARS-CoV-2 by FDA under an Emergency Use Authorization (EUA).  This EUA will remain in effect (meaning this test can be used) for the duration of  the COV                          ID-19 declaration under Section 564(b)(1) of the Act, 21 U.S.C. section 360bbb-3(b)(1), unless the authorization is terminated or revoked sooner.    Admission on 10/07/2022, Discharged on 10/07/2022  Component Date Value Ref Range Status   Color, Urine 10/07/2022 YELLOW  YELLOW Final   APPearance 10/07/2022 HAZY (A)  CLEAR Final   Specific Gravity, Urine 10/07/2022 1.011  1.005 - 1.030 Final   pH 10/07/2022 7.0  5.0 - 8.0 Final   Glucose, UA 10/07/2022 NEGATIVE  NEGATIVE mg/dL Final   Hgb urine dipstick 10/07/2022 SMALL (A)  NEGATIVE Final   Bilirubin Urine 10/07/2022 NEGATIVE  NEGATIVE Final   Ketones, ur 10/07/2022 NEGATIVE  NEGATIVE mg/dL Final   Protein, ur 24/06/7352 NEGATIVE  NEGATIVE mg/dL Final   Nitrite 29/92/4268 NEGATIVE  NEGATIVE Final   Leukocytes,Ua 10/07/2022 LARGE (A)  NEGATIVE Final   RBC / HPF 10/07/2022 0-5  0 - 5 RBC/hpf Final   WBC, UA 10/07/2022 0-5  0 - 5 WBC/hpf Final   Bacteria, UA 10/07/2022 FEW (A)  NONE SEEN Final   Squamous Epithelial / HPF 10/07/2022 11-20  0 - 5 Final   Performed at [facility], [address], [place], [place] [address]   I-stat hCG, quantitative 10/07/2022 <5.0  <5 mIU/mL Final  Comment 3 10/07/2022          Final   Comment:   GEST. AGE      CONC.  (mIU/mL)   <=1 WEEK        5 - 50     2 WEEKS       50 - 500     3 WEEKS        100 - 10,000     4 WEEKS     1,000 - 30,000        FEMALE AND NON-PREGNANT FEMALE:     LESS THAN 5 mIU/mL    Sodium 10/07/2022 138  135 - 145 mmol/L Final   Potassium 10/07/2022 4.3  3.5 - 5.1 mmol/L Final   Chloride 10/07/2022 104  98 - 111 mmol/L Final   CO2 10/07/2022 28  22 - 32 mmol/L Final   Glucose, Bld 10/07/2022 90  70 - 99 mg/dL Final   Glucose reference range applies only to samples taken after fasting for at least 8 hours.   BUN 10/07/2022 8  6 - 20 mg/dL Final   Creatinine, Ser 10/07/2022 0.96  0.44 - 1.00 mg/dL Final   Calcium 16/07/9603 9.1  8.9 - 10.3 mg/dL Final   GFR, Estimated 10/07/2022 >60  >60 mL/min Final   Comment: (NOTE) Calculated using the CKD-EPI Creatinine Equation (2021)    Anion gap 10/07/2022 6  5 - 15 Final   Performed at [facility], [address], [place], [place] [address]   WBC 10/07/2022 5.8  4.0 - 10.5 K/uL Final   RBC 10/07/2022 4.36  3.87 - 5.11 MIL/uL Final   Hemoglobin 10/07/2022 12.9  12.0 - 15.0 g/dL Final   HCT 11/91/4782 40.0  36.0 - 46.0 % Final   MCV 10/07/2022 91.7  80.0 - 100.0 fL Final   MCH 10/07/2022 29.6  26.0 - 34.0 pg Final   MCHC 10/07/2022 32.3  30.0 - 36.0 g/dL Final   RDW 95/62/1308 12.4  11.5 - 15.5 % Final   Platelets 10/07/2022 295  150 - 400 K/uL Final   nRBC 10/07/2022 0.0  0.0 - 0.2 % Final   Performed at [facility], [address] [place], [place] [address]   SARS Coronavirus 2 by RT PCR 10/07/2022 NEGATIVE  NEGATIVE Final   Comment: (NOTE) SARS-CoV-2 target nucleic acids are NOT DETECTED.  The SARS-CoV-2 RNA is generally detectable in upper respiratory specimens during the acute phase of infection. The lowest concentration of SARS-CoV-2 viral copies this assay can detect is 138 copies/mL. A negative result does not preclude SARS-Cov-2 infection and should not be used as the sole basis for treatment or other patient management decisions. A negative result may occur with  improper specimen  collection/handling, submission of specimen other than nasopharyngeal swab, presence of viral mutation(s) within the areas targeted by this assay, and inadequate number of viral copies(<138 copies/mL). A negative result must be combined with clinical observations, patient history, and epidemiological information. The expected result is Negative.  Fact Sheet for Patients:  BloggerCourse.com  Fact Sheet for Healthcare Providers:  SeriousBroker.it  This test is no                          t yet approved or cleared by the [place] FDA and  has been authorized for detection and/or diagnosis of SARS-CoV-2 by FDA under an Emergency Use Authorization (EUA). This EUA will remain  in  effect (meaning this test can be used) for the duration of the COVID-19 declaration under Section 564(b)(1) of the Act, 21 U.S.C.section 360bbb-3(b)(1), unless the authorization is terminated  or revoked sooner.       Influenza A by PCR 10/07/2022 NEGATIVE  NEGATIVE Final   Influenza B by PCR 10/07/2022 NEGATIVE  NEGATIVE Final   Comment: (NOTE) The Xpert Xpress SARS-CoV-2/FLU/RSV plus assay is intended as an aid in the diagnosis of influenza from Nasopharyngeal swab specimens and should not be used as a sole basis for treatment. Nasal washings and aspirates are unacceptable for Xpert Xpress SARS-CoV-2/FLU/RSV testing.  Fact Sheet for Patients: BloggerCourse.comhttps://www.fda.gov/media/152166/download  Fact Sheet for Healthcare Providers: SeriousBroker.ithttps://www.fda.gov/media/152162/download  This test is not yet approved or cleared by the [place] FDA and has been authorized for detection and/or diagnosis of SARS-CoV-2 by FDA under an Emergency Use Authorization (EUA). This EUA will remain in effect (meaning this test can be used) for the duration of the COVID-19 declaration under Section 564(b)(1) of the Act, 21 U.S.C. section 360bbb-3(b)(1), unless the authorization is  terminated or revoked.     Resp Syncytial Virus by PCR 10/07/2022 NEGATIVE  NEGATIVE Final   Comment: (NOTE) Fact Sheet for Patients: BloggerCourse.comhttps://www.fda.gov/media/152166/download  Fact Sheet for Healthcare Providers: SeriousBroker.ithttps://www.fda.gov/media/152162/download  This test is not yet approved or cleared by the [place] FDA and has been authorized for detection and/or diagnosis of SARS-CoV-2 by FDA under an Emergency Use Authorization (EUA). This EUA will remain in effect (meaning this test can be used) for the duration of the COVID-19 declaration under Section 564(b)(1) of the Act, 21 U.S.C. section 360bbb-3(b)(1), unless the authorization is terminated or revoked.  Performed at [facility], [address] [place], [place] [address]    Specimen Description 10/07/2022 URINE, CLEAN CATCH   Final   Special Requests 10/07/2022    Final                   Value:NONE Performed at [facility], [address] [place], [place] [address]    Culture 10/07/2022 MULTIPLE SPECIES PRESENT, SUGGEST RECOLLECTION (A)   Final   Report Status 10/07/2022 10/08/2022 FINAL   Final  Admission on 07/25/2022, Discharged on 10/07/2022  Component Date Value Ref Range Status   SARS Coronavirus 2 by RT PCR 07/25/2022 NEGATIVE  NEGATIVE Final   Comment: (NOTE) SARS-CoV-2 target nucleic acids are NOT DETECTED.  The SARS-CoV-2 RNA is generally detectable in upper respiratory specimens during the acute phase of infection. The lowest concentration of SARS-CoV-2 viral copies this assay can detect is 138 copies/mL. A negative result does not preclude SARS-Cov-2 infection and should not be used as the sole basis for treatment or other patient management decisions. A negative result may occur with  improper specimen collection/handling, submission of specimen other than nasopharyngeal swab, presence of viral mutation(s) within the areas targeted by this assay, and inadequate number of  viral copies(<138 copies/mL). A negative result must be combined with clinical observations, patient history, and epidemiological information. The expected result is Negative.  Fact Sheet for Patients:  BloggerCourse.comhttps://www.fda.gov/media/152166/download  Fact Sheet for Healthcare Providers:  SeriousBroker.ithttps://www.fda.gov/media/152162/download  This test is no                          t yet approved or cleared by the [place] FDA and  has been authorized for detection and/or diagnosis of SARS-CoV-2 by FDA under an Emergency Use Authorization (EUA). This EUA will  remain  in effect (meaning this test can be used) for the duration of the COVID-19 declaration under Section 564(b)(1) of the Act, 21 U.S.C.section 360bbb-3(b)(1), unless the authorization is terminated  or revoked sooner.       Influenza A by PCR 07/25/2022 NEGATIVE  NEGATIVE Final   Influenza B by PCR 07/25/2022 NEGATIVE  NEGATIVE Final   Comment: (NOTE) The Xpert Xpress SARS-CoV-2/FLU/RSV plus assay is intended as an aid in the diagnosis of influenza from Nasopharyngeal swab specimens and should not be used as a sole basis for treatment. Nasal washings and aspirates are unacceptable for Xpert Xpress SARS-CoV-2/FLU/RSV testing.  Fact Sheet for Patients: BloggerCourse.comhttps://www.fda.gov/media/152166/download  Fact Sheet for Healthcare Providers: SeriousBroker.ithttps://www.fda.gov/media/152162/download  This test is not yet approved or cleared by the [place] FDA and has been authorized for detection and/or diagnosis of SARS-CoV-2 by FDA under an Emergency Use Authorization (EUA). This EUA will remain in effect (meaning this test can be used) for the duration of the COVID-19 declaration under Section 564(b)(1) of the Act, 21 U.S.C. section 360bbb-3(b)(1), unless the authorization is terminated or revoked.  Performed at [facility], [address] [place], [place] [address]    WBC 07/25/2022 8.3  4.0 - 10.5 K/uL Final   RBC 07/25/2022 4.44   3.87 - 5.11 MIL/uL Final   Hemoglobin 07/25/2022 13.7  12.0 - 15.0 g/dL Final   HCT 96/29/528410/09/2022 40.2  36.0 - 46.0 % Final   MCV 07/25/2022 90.5  80.0 - 100.0 fL Final   MCH 07/25/2022 30.9  26.0 - 34.0 pg Final   MCHC 07/25/2022 34.1  30.0 - 36.0 g/dL Final   RDW 13/24/401010/09/2022 12.2  11.5 - 15.5 % Final   Platelets 07/25/2022 248  150 - 400 K/uL Final   nRBC 07/25/2022 0.0  0.0 - 0.2 % Final   Neutrophils Relative % 07/25/2022 43  % Final   Neutro Abs 07/25/2022 3.6  1.7 - 7.7 K/uL Final   Lymphocytes Relative 07/25/2022 52  % Final   Lymphs Abs 07/25/2022 4.2 (H)  0.7 - 4.0 K/uL Final   Monocytes Relative 07/25/2022 5  % Final   Monocytes Absolute 07/25/2022 0.4  0.1 - 1.0 K/uL Final   Eosinophils Relative 07/25/2022 0  % Final   Eosinophils Absolute 07/25/2022 0.0  0.0 - 0.5 K/uL Final   Basophils Relative 07/25/2022 0  % Final   Basophils Absolute 07/25/2022 0.0  0.0 - 0.1 K/uL Final   Immature Granulocytes 07/25/2022 0  % Final   Abs Immature Granulocytes 07/25/2022 0.02  0.00 - 0.07 K/uL Final   Performed at [facility], [address] [place], [place] [address]   Sodium 07/25/2022 138  135 - 145 mmol/L Final   Potassium 07/25/2022 4.0  3.5 - 5.1 mmol/L Final   Chloride 07/25/2022 104  98 - 111 mmol/L Final   CO2 07/25/2022 29  22 - 32 mmol/L Final   Glucose, Bld 07/25/2022 83  70 - 99 mg/dL Final   Glucose reference range applies only to samples taken after fasting for at least 8 hours.   BUN 07/25/2022 11  6 - 20 mg/dL Final   Creatinine, Ser 07/25/2022 0.97  0.44 - 1.00 mg/dL Final   Calcium 66/44/034710/09/2022 9.2  8.9 - 10.3 mg/dL Final   Total Protein 42/59/563810/09/2022 7.0  6.5 - 8.1 g/dL Final   Albumin 75/64/332910/09/2022 3.8  3.5 - 5.0 g/dL Final   AST 51/88/416610/09/2022 18  15 - 41 U/L Final  ALT 07/25/2022 22  0 - 44 U/L Final   Alkaline Phosphatase 07/25/2022 64  38 - 126 U/L Final   Total Bilirubin 07/25/2022 0.2 (L)  0.3 - 1.2 mg/dL Final   GFR, Estimated 07/25/2022 >60  >60 mL/min Final    Comment: (NOTE) Calculated using the CKD-EPI Creatinine Equation (2021)    Anion gap 07/25/2022 5  5 - 15 Final   Performed at [facility], [place] [address] [place], [place] [address]   POC Amphetamine UR 07/25/2022 None Detected  NONE DETECTED (Cut Off Level 1000 ng/mL) Preliminary   POC Secobarbital (BAR) 07/25/2022 None Detected  NONE DETECTED (Cut Off Level 300 ng/mL) Preliminary   POC Buprenorphine (BUP) 07/25/2022 None Detected  NONE DETECTED (Cut Off Level 10 ng/mL) Preliminary   POC Oxazepam (BZO) 07/25/2022 None Detected  NONE DETECTED (Cut Off Level 300 ng/mL) Preliminary   POC Cocaine UR 07/25/2022 None Detected  NONE DETECTED (Cut Off Level 300 ng/mL) Preliminary   POC Methamphetamine UR 07/25/2022 None Detected  NONE DETECTED (Cut Off Level 1000 ng/mL) Preliminary   POC Morphine 07/25/2022 None Detected  NONE DETECTED (Cut Off Level 300 ng/mL) Preliminary   POC Methadone UR 07/25/2022 None Detected  NONE DETECTED (Cut Off Level 300 ng/mL) Preliminary   POC Oxycodone UR 07/25/2022 None Detected  NONE DETECTED (Cut Off Level 100 ng/mL) Preliminary   POC Marijuana UR 07/25/2022 None Detected  NONE DETECTED (Cut Off Level 50 ng/mL) Preliminary   SARSCOV2ONAVIRUS 2 AG 07/25/2022 NEGATIVE  NEGATIVE Final   Comment: (NOTE) SARS-CoV-2 antigen NOT DETECTED.   Negative results are presumptive.  Negative results do not preclude SARS-CoV-2 infection and should not be used as the sole basis for treatment or other patient management decisions, including infection  control decisions, particularly in the presence of clinical signs and  symptoms consistent with COVID-19, or in those who have been in contact with the virus.  Negative results must be combined with clinical observations, patient history, and epidemiological information. The expected result is Negative.  Fact Sheet for Patients: HandmadeRecipes.com.cy  Fact Sheet for Healthcare  Providers: FuneralLife.at  This test is not yet approved or cleared by the [place] FDA and  has been authorized for detection and/or diagnosis of SARS-CoV-2 by FDA under an Emergency Use Authorization (EUA).  This EUA will remain in effect (meaning this test can be used) for the duration of  the COV                          ID-19 declaration under Section 564(b)(1) of the Act, 21 U.S.C. section 360bbb-3(b)(1), unless the authorization is terminated or revoked sooner.     Cholesterol 07/25/2022 181  0 - 200 mg/dL Final   Triglycerides 07/25/2022 118  <150 mg/dL Final   HDL 07/25/2022 45  >40 mg/dL Final   Total CHOL/HDL Ratio 07/25/2022 4.0  RATIO Final   VLDL 07/25/2022 24  0 - 40 mg/dL Final   LDL Cholesterol 07/25/2022 112 (H)  0 - 99 mg/dL Final   Comment:        Total Cholesterol/HDL:CHD Risk Coronary Heart Disease Risk Table                     Men   Women  1/2 Average Risk   3.4   3.3  Average Risk       5.0   4.4  2 X Average Risk   9.6  7.1  3 X Average Risk  23.4   11.0        Use the calculated Patient Ratio above and the CHD Risk Table to determine the patient's CHD Risk.        ATP III CLASSIFICATION (LDL):  <100     mg/dL   Optimal  960-454  mg/dL   Near or Above                    Optimal  130-159  mg/dL   Borderline  098-119  mg/dL   High  >147     mg/dL   Very High Performed at [facility], [address], [place], [place] [address]    TSH 07/25/2022 6.668 (H)  0.350 - 4.500 uIU/mL Final   Comment: Performed by a 3rd Generation assay with a functional sensitivity of <=0.01 uIU/mL. Performed at [facility], [address], [place], [place] [address]    Glucose-Capillary 07/26/2022 104 (H)  70 - 99 mg/dL Final   Glucose reference range applies only to samples taken after fasting for at least 8 hours.   T3, Free 07/31/2022 2.3  2.0 - 4.4 pg/mL Final   Comment: (NOTE) Performed At: [facility] [address] [place], [place] [id number] Jolene Schimke MD XB:[id number]    Free T4 07/31/2022 0.60 (L)  0.61 - 1.12 ng/dL Final   Comment: (NOTE) Biotin ingestion may interfere with free T4 tests. If the results are inconsistent with the TSH level, previous test results, or the clinical presentation, then consider biotin interference. If needed, order repeat testing after stopping biotin. Performed at [facility], [address], [place] [place], [place] [address]    Glucose-Capillary 08/29/2022 100 (H)  70 - 99 mg/dL Final   Glucose reference range applies only to samples taken after fasting for at least 8 hours.   TSH 09/05/2022 0.793  0.350 - 4.500 uIU/mL Final   Comment: Performed by a 3rd Generation assay with a functional sensitivity of <=0.01 uIU/mL. Performed at [facility], [address], [place], [place] [address]    Free T4 09/05/2022 0.73  0.61 - 1.12 ng/dL Final   Comment: (NOTE) Biotin ingestion may interfere with free T4 tests. If the results are inconsistent with the TSH level, previous test results, or the clinical presentation, then consider biotin interference. If needed, order repeat testing after stopping biotin. Performed at [facility], [address], [place], [place] [address]    Preg Test, Ur 09/06/2022 Negative  Negative Final   Preg Test, Ur 09/06/2022 NEGATIVE  NEGATIVE Final   Comment:        THE SENSITIVITY OF THIS METHODOLOGY IS >24 mIU/mL    Preg Test, Ur 09/05/2022 NEGATIVE  NEGATIVE Final   Comment:        THE SENSITIVITY OF THIS METHODOLOGY IS >24 mIU/mL    Sodium 09/13/2022 136  135 - 145 mmol/L Final   Potassium 09/13/2022 4.5  3.5 - 5.1 mmol/L Final   Chloride 09/13/2022 105  98 - 111 mmol/L Final   CO2 09/13/2022 21 (L)  22 - 32 mmol/L Final   Glucose, Bld 09/13/2022 111 (H)  70 - 99 mg/dL Final   Glucose reference range applies only to samples taken after fasting for at least 8 hours.   BUN 09/13/2022 14  6 - 20 mg/dL  Final   Creatinine, Ser 09/13/2022 0.92  0.44 - 1.00 mg/dL Final  Calcium 09/13/2022 9.1  8.9 - 10.3 mg/dL Final   GFR, Estimated 09/13/2022 >60  >60 mL/min Final   Comment: (NOTE) Calculated using the CKD-EPI Creatinine Equation (2021)    Anion gap 09/13/2022 10  5 - 15 Final   Performed at [facility], [address] [place], [place] [address]   Vitamin B-12 09/13/2022 585  180 - 914 pg/mL Final   Comment: (NOTE) This assay is not validated for testing neonatal or myeloproliferative syndrome specimens for Vitamin B12 levels. Performed at [facility], [address] [place], [place] [address]    Color, Urine 09/14/2022 YELLOW  YELLOW Final   APPearance 09/14/2022 HAZY (A)  CLEAR Final   Specific Gravity, Urine 09/14/2022 1.025  1.005 - 1.030 Final   pH 09/14/2022 5.0  5.0 - 8.0 Final   Glucose, UA 09/14/2022 NEGATIVE  NEGATIVE mg/dL Final   Hgb urine dipstick 09/14/2022 NEGATIVE  NEGATIVE Final   Bilirubin Urine 09/14/2022 NEGATIVE  NEGATIVE Final   Ketones, ur 09/14/2022 NEGATIVE  NEGATIVE mg/dL Final   Protein, ur 09/81/191412/11/2021 NEGATIVE  NEGATIVE mg/dL Final   Nitrite 78/29/562112/11/2021 NEGATIVE  NEGATIVE Final   Leukocytes,Ua 09/14/2022 TRACE (A)  NEGATIVE Final   RBC / HPF 09/14/2022 0-5  0 - 5 RBC/hpf Final   WBC, UA 09/14/2022 0-5  0 - 5 WBC/hpf Final   Bacteria, UA 09/14/2022 RARE (A)  NONE SEEN Final   Squamous Epithelial / HPF 09/14/2022 0-5  0 - 5 Final   Mucus 09/14/2022 PRESENT   Final   Performed at [facility], [address] [place], [place] [address]   Glucose-Capillary 09/16/2022 105 (H)  70 - 99 mg/dL Final   Glucose reference range applies only to samples taken after fasting for at least 8 hours.   SARS Coronavirus 2 by RT PCR 09/30/2022 NEGATIVE  NEGATIVE Final   Comment: (NOTE) SARS-CoV-2 target nucleic acids are NOT DETECTED.  The SARS-CoV-2 RNA is generally detectable in upper respiratory specimens during the acute phase of infection. The  lowest concentration of SARS-CoV-2 viral copies this assay can detect is 138 copies/mL. A negative result does not preclude SARS-Cov-2 infection and should not be used as the sole basis for treatment or other patient management decisions. A negative result may occur with  improper specimen collection/handling, submission of specimen other than nasopharyngeal swab, presence of viral mutation(s) within the areas targeted by this assay, and inadequate number of viral copies(<138 copies/mL). A negative result must be combined with clinical observations, patient history, and epidemiological information. The expected result is Negative.  Fact Sheet for Patients:  BloggerCourse.comhttps://www.fda.gov/media/152166/download  Fact Sheet for Healthcare Providers:  SeriousBroker.ithttps://www.fda.gov/media/152162/download  This test is no                          t yet approved or cleared by the [place] FDA and  has been authorized for detection and/or diagnosis of SARS-CoV-2 by FDA under an Emergency Use Authorization (EUA). This EUA will remain  in effect (meaning this test can be used) for the duration of the COVID-19 declaration under Section 564(b)(1) of the Act, 21 U.S.C.section 360bbb-3(b)(1), unless the authorization is terminated  or revoked sooner.       Influenza A by PCR 09/30/2022 NEGATIVE  NEGATIVE Final   Influenza B by PCR 09/30/2022 NEGATIVE  NEGATIVE Final   Comment: (NOTE) The Xpert Xpress SARS-CoV-2/FLU/RSV plus assay is intended as an aid in the diagnosis  of influenza from Nasopharyngeal swab specimens and should not be used as a sole basis for treatment. Nasal washings and aspirates are unacceptable for Xpert Xpress SARS-CoV-2/FLU/RSV testing.  Fact Sheet for Patients: EntrepreneurPulse.com.au  Fact Sheet for Healthcare Providers: IncredibleEmployment.be  This test is not yet approved or cleared by the [place] FDA and has been authorized for  detection and/or diagnosis of SARS-CoV-2 by FDA under an Emergency Use Authorization (EUA). This EUA will remain in effect (meaning this test can be used) for the duration of the COVID-19 declaration under Section 564(b)(1) of the Act, 21 U.S.C. section 360bbb-3(b)(1), unless the authorization is terminated or revoked.     Resp Syncytial Virus by PCR 09/30/2022 NEGATIVE  NEGATIVE Final   Comment: (NOTE) Fact Sheet for Patients: EntrepreneurPulse.com.au  Fact Sheet for Healthcare Providers: IncredibleEmployment.be  This test is not yet approved or cleared by the [place] FDA and has been authorized for detection and/or diagnosis of SARS-CoV-2 by FDA under an Emergency Use Authorization (EUA). This EUA will remain in effect (meaning this test can be used) for the duration of the COVID-19 declaration under Section 564(b)(1) of the Act, 21 U.S.C. section 360bbb-3(b)(1), unless the authorization is terminated or revoked.  Performed at [facility], [place] [address] [place], [place] [address]    Sodium 10/01/2022 136  135 - 145 mmol/L Final   Potassium 10/01/2022 3.8  3.5 - 5.1 mmol/L Final   Chloride 10/01/2022 104  98 - 111 mmol/L Final   CO2 10/01/2022 27  22 - 32 mmol/L Final   Glucose, Bld 10/01/2022 98  70 - 99 mg/dL Final   Glucose reference range applies only to samples taken after fasting for at least 8 hours.   BUN 10/01/2022 12  6 - 20 mg/dL Final   Creatinine, Ser 10/01/2022 0.98  0.44 - 1.00 mg/dL Final   Calcium 10/01/2022 8.9  8.9 - 10.3 mg/dL Final   GFR, Estimated 10/01/2022 >60  >60 mL/min Final   Comment: (NOTE) Calculated using the CKD-EPI Creatinine Equation (2021)    Anion gap 10/01/2022 5  5 - 15 Final   Performed at [facility], [place] [address], [place], [place] [address]   WBC 10/01/2022 5.1  4.0 - 10.5 K/uL Final   RBC 10/01/2022 4.31  3.87 - 5.11 MIL/uL Final   Hemoglobin 10/01/2022 12.7  12.0 - 15.0  g/dL Final   HCT 10/01/2022 38.8  36.0 - 46.0 % Final   MCV 10/01/2022 90.0  80.0 - 100.0 fL Final   MCH 10/01/2022 29.5  26.0 - 34.0 pg Final   MCHC 10/01/2022 32.7  30.0 - 36.0 g/dL Final   RDW 10/01/2022 12.4  11.5 - 15.5 % Final   Platelets 10/01/2022 300  150 - 400 K/uL Final   nRBC 10/01/2022 0.0  0.0 - 0.2 % Final   Performed at [place] [address], [place], [place] [address]   Lipase 10/01/2022 29  11 - 51 U/L Final   Performed at [place] [address], [place], [place] [address]   Color, Urine 10/05/2022 YELLOW  YELLOW Final   APPearance 10/05/2022 HAZY (A)  CLEAR Final   Specific Gravity, Urine 10/05/2022 1.018  1.005 - 1.030 Final   pH 10/05/2022 5.0  5.0 - 8.0 Final   Glucose, UA 10/05/2022 NEGATIVE  NEGATIVE mg/dL Final   Hgb urine dipstick 10/05/2022 NEGATIVE  NEGATIVE Final   Bilirubin Urine 10/05/2022 NEGATIVE  NEGATIVE Final  Ketones, ur 10/05/2022 NEGATIVE  NEGATIVE mg/dL Final   Protein, ur 81/19/1478 NEGATIVE  NEGATIVE mg/dL Final   Nitrite 29/56/2130 NEGATIVE  NEGATIVE Final   Leukocytes,Ua 10/05/2022 TRACE (A)  NEGATIVE Final   RBC / HPF 10/05/2022 0-5  0 - 5 RBC/hpf Final   WBC, UA 10/05/2022 0-5  0 - 5 WBC/hpf Final   Bacteria, UA 10/05/2022 RARE (A)  NONE SEEN Final   Squamous Epithelial / HPF 10/05/2022 0-5  0 - 5 Final   Mucus 10/05/2022 PRESENT   Final   Performed at [facility], [address] [place], [place] [address]   Specimen Description 10/05/2022 URINE, CLEAN CATCH   Final   Special Requests 10/05/2022    Final                   Value:NONE Performed at [facility], [address], [place], [place] [address]    Culture 10/05/2022 10,000 COLONIES/mL MULTIPLE SPECIES PRESENT, SUGGEST RECOLLECTION (A)   Final   Report Status 10/05/2022 10/07/2022 FINAL   Final  Admission on 07/04/2022, Discharged on 07/05/2022  Component Date Value Ref Range Status   Sodium 07/04/2022 142  135 - 145 mmol/L Final    Potassium 07/04/2022 4.4  3.5 - 5.1 mmol/L Final   Chloride 07/04/2022 109  98 - 111 mmol/L Final   CO2 07/04/2022 26  22 - 32 mmol/L Final   Glucose, Bld 07/04/2022 96  70 - 99 mg/dL Final   Glucose reference range applies only to samples taken after fasting for at least 8 hours.   BUN 07/04/2022 15  6 - 20 mg/dL Final   Creatinine, Ser 07/04/2022 0.83  0.44 - 1.00 mg/dL Final   Calcium 95/28/4132 9.3  8.9 - 10.3 mg/dL Final   Total Protein 44/10/270 7.2  6.5 - 8.1 g/dL Final   Albumin 53/66/4403 3.9  3.5 - 5.0 g/dL Final   AST 47/42/5956 23  15 - 41 U/L Final   ALT 07/04/2022 28  0 - 44 U/L Final   Alkaline Phosphatase 07/04/2022 77  38 - 126 U/L Final   Total Bilirubin 07/04/2022 0.4  0.3 - 1.2 mg/dL Final   GFR, Estimated 07/04/2022 >60  >60 mL/min Final   Comment: (NOTE) Calculated using the CKD-EPI Creatinine Equation (2021)    Anion gap 07/04/2022 7  5 - 15 Final   Performed at [facility], [address] [place], [place] [address]   Alcohol, Ethyl (B) 07/04/2022 <10  <10 mg/dL Final   Comment: (NOTE) Lowest detectable limit for serum alcohol is 10 mg/dL.  For medical purposes only. Performed at [facility], [address] [place], [place] [address]    WBC 07/04/2022 7.0  4.0 - 10.5 K/uL Final   RBC 07/04/2022 4.18  3.87 - 5.11 MIL/uL Final   Hemoglobin 07/04/2022 12.8  12.0 - 15.0 g/dL Final   HCT 51/88/4166 39.1  36.0 - 46.0 % Final   MCV 07/04/2022 93.5  80.0 - 100.0 fL Final   MCH 07/04/2022 30.6  26.0 - 34.0 pg Final   MCHC 07/04/2022 32.7  30.0 - 36.0 g/dL Final   RDW 04/12/1600 12.9  11.5 - 15.5 % Final   Platelets 07/04/2022 243  150 - 400 K/uL Final   nRBC 07/04/2022 0.0  0.0 - 0.2 % Final   Neutrophils Relative % 07/04/2022 43  % Final   Neutro Abs 07/04/2022 3.0  1.7 - 7.7 K/uL Final  Lymphocytes Relative 07/04/2022 50  % Final   Lymphs Abs 07/04/2022 3.5  0.7 - 4.0 K/uL Final   Monocytes Relative 07/04/2022 7  %  Final   Monocytes Absolute 07/04/2022 0.5  0.1 - 1.0 K/uL Final   Eosinophils Relative 07/04/2022 0  % Final   Eosinophils Absolute 07/04/2022 0.0  0.0 - 0.5 K/uL Final   Basophils Relative 07/04/2022 0  % Final   Basophils Absolute 07/04/2022 0.0  0.0 - 0.1 K/uL Final   Immature Granulocytes 07/04/2022 0  % Final   Abs Immature Granulocytes 07/04/2022 0.01  0.00 - 0.07 K/uL Final   Performed at [facility], [address], [place], [place] [address]   I-stat hCG, quantitative 07/04/2022 <5.0  <5 mIU/mL Final   Comment 3 07/04/2022          Final   Comment:   GEST. AGE      CONC.  (mIU/mL)   <=1 WEEK        5 - 50     2 WEEKS       50 - 500     3 WEEKS       100 - 10,000     4 WEEKS     1,000 - 30,000        FEMALE AND NON-PREGNANT FEMALE:     LESS THAN 5 mIU/mL   Admission on 06/14/2022, Discharged on 06/14/2022  Component Date Value Ref Range Status   Color, UA 06/14/2022 yellow  yellow Final   Clarity, UA 06/14/2022 cloudy (A)  clear Final   Glucose, UA 06/14/2022 negative  negative mg/dL Final   Bilirubin, UA 08/16/1593 negative  negative Final   Ketones, POC UA 06/14/2022 negative  negative mg/dL Final   Spec Grav, UA 58/59/2924 1.025  1.010 - 1.025 Final   Blood, UA 06/14/2022 negative  negative Final   pH, UA 06/14/2022 7.5  5.0 - 8.0 Final   Protein Ur, POC 06/14/2022 =30 (A)  negative mg/dL Final   Urobilinogen, UA 06/14/2022 0.2  0.2 or 1.0 E.U./dL Final   Nitrite, UA 46/28/6381 Negative  Negative Final   Leukocytes, UA 06/14/2022 Negative  Negative Final   Preg Test, Ur 06/14/2022 Negative  Negative Final   Specimen Description 06/14/2022 URINE, CLEAN CATCH   Final   Special Requests 06/14/2022    Final                   Value:NONE Performed at [facility], [address] [place], [place] [address]    Culture 06/14/2022 MULTIPLE SPECIES PRESENT, SUGGEST RECOLLECTION (A)   Final   Report Status 06/14/2022 06/16/2022 FINAL   Final   Admission on 06/07/2022, Discharged on 06/10/2022  Component Date Value Ref Range Status   SARS Coronavirus 2 by RT PCR 06/07/2022 NEGATIVE  NEGATIVE Final   Comment: (NOTE) SARS-CoV-2 target nucleic acids are NOT DETECTED.  The SARS-CoV-2 RNA is generally detectable in upper respiratory specimens during the acute phase of infection. The lowest concentration of SARS-CoV-2 viral copies this assay can detect is 138 copies/mL. A negative result does not preclude SARS-Cov-2 infection and should not be used as the sole basis for treatment or other patient management decisions. A negative result may occur with  improper specimen collection/handling, submission of specimen other than nasopharyngeal swab, presence of viral mutation(s) within the areas targeted by this assay, and inadequate number of viral copies(<138 copies/mL). A negative result must be combined with clinical observations, patient  history, and epidemiological information. The expected result is Negative.  Fact Sheet for Patients:  BloggerCourse.com  Fact Sheet for Healthcare Providers:  SeriousBroker.it  This test is no                          t yet approved or cleared by the [place] FDA and  has been authorized for detection and/or diagnosis of SARS-CoV-2 by FDA under an Emergency Use Authorization (EUA). This EUA will remain  in effect (meaning this test can be used) for the duration of the COVID-19 declaration under Section 564(b)(1) of the Act, 21 U.S.C.section 360bbb-3(b)(1), unless the authorization is terminated  or revoked sooner.       Influenza A by PCR 06/07/2022 NEGATIVE  NEGATIVE Final   Influenza B by PCR 06/07/2022 NEGATIVE  NEGATIVE Final   Comment: (NOTE) The Xpert Xpress SARS-CoV-2/FLU/RSV plus assay is intended as an aid in the diagnosis of influenza from Nasopharyngeal swab specimens and should not be used as a sole basis for treatment.  Nasal washings and aspirates are unacceptable for Xpert Xpress SARS-CoV-2/FLU/RSV testing.  Fact Sheet for Patients: BloggerCourse.com  Fact Sheet for Healthcare Providers: SeriousBroker.it  This test is not yet approved or cleared by the [place] FDA and has been authorized for detection and/or diagnosis of SARS-CoV-2 by FDA under an Emergency Use Authorization (EUA). This EUA will remain in effect (meaning this test can be used) for the duration of the COVID-19 declaration under Section 564(b)(1) of the Act, 21 U.S.C. section 360bbb-3(b)(1), unless the authorization is terminated or revoked.  Performed at [facility], [address] [place], [place] [address]    WBC 06/07/2022 5.7  4.0 - 10.5 K/uL Final   RBC 06/07/2022 4.39  3.87 - 5.11 MIL/uL Final   Hemoglobin 06/07/2022 13.2  12.0 - 15.0 g/dL Final   HCT 84/13/2440 40.4  36.0 - 46.0 % Final   MCV 06/07/2022 92.0  80.0 - 100.0 fL Final   MCH 06/07/2022 30.1  26.0 - 34.0 pg Final   MCHC 06/07/2022 32.7  30.0 - 36.0 g/dL Final   RDW 08/10/2535 12.4  11.5 - 15.5 % Final   Platelets 06/07/2022 308  150 - 400 K/uL Final   nRBC 06/07/2022 0.0  0.0 - 0.2 % Final   Neutrophils Relative % 06/07/2022 42  % Final   Neutro Abs 06/07/2022 2.4  1.7 - 7.7 K/uL Final   Lymphocytes Relative 06/07/2022 54  % Final   Lymphs Abs 06/07/2022 3.1  0.7 - 4.0 K/uL Final   Monocytes Relative 06/07/2022 4  % Final   Monocytes Absolute 06/07/2022 0.2  0.1 - 1.0 K/uL Final   Eosinophils Relative 06/07/2022 0  % Final   Eosinophils Absolute 06/07/2022 0.0  0.0 - 0.5 K/uL Final   Basophils Relative 06/07/2022 0  % Final   Basophils Absolute 06/07/2022 0.0  0.0 - 0.1 K/uL Final   Immature Granulocytes 06/07/2022 0  % Final   Abs Immature Granulocytes 06/07/2022 0.01  0.00 - 0.07 K/uL Final   Performed at [facility], [address] [place], [place] [address]   Sodium 06/07/2022  139  135 - 145 mmol/L Final   Potassium 06/07/2022 4.0  3.5 - 5.1 mmol/L Final   Chloride 06/07/2022 104  98 - 111 mmol/L Final   CO2 06/07/2022 28  22 - 32 mmol/L Final   Glucose, Bld 06/07/2022 104 (H)  70 - 99 mg/dL  Final   Glucose reference range applies only to samples taken after fasting for at least 8 hours.   BUN 06/07/2022 8  6 - 20 mg/dL Final   Creatinine, Ser 06/07/2022 0.84  0.44 - 1.00 mg/dL Final   Calcium 96/01/5408 9.1  8.9 - 10.3 mg/dL Final   Total Protein 81/19/1478 6.9  6.5 - 8.1 g/dL Final   Albumin 29/56/2130 3.7  3.5 - 5.0 g/dL Final   AST 86/57/8469 19  15 - 41 U/L Final   ALT 06/07/2022 22  0 - 44 U/L Final   Alkaline Phosphatase 06/07/2022 54  38 - 126 U/L Final   Total Bilirubin 06/07/2022 0.4  0.3 - 1.2 mg/dL Final   GFR, Estimated 06/07/2022 >60  >60 mL/min Final   Comment: (NOTE) Calculated using the CKD-EPI Creatinine Equation (2021)    Anion gap 06/07/2022 7  5 - 15 Final   Performed at [facility], [address] [place], [place] [address]   Hgb A1c MFr Bld 06/07/2022 5.0  4.8 - 5.6 % Final   Comment: (NOTE) Pre diabetes:          5.7%-6.4%  Diabetes:              >6.4%  Glycemic control for   <7.0% adults with diabetes    Mean Plasma Glucose 06/07/2022 96.8  mg/dL Final   Performed at [facility], [address], [place], [place] [address]   TSH 06/07/2022 1.620  0.350 - 4.500 uIU/mL Final   Comment: Performed by a 3rd Generation assay with a functional sensitivity of <=0.01 uIU/mL. Performed at [facility], [address], [place], [place] [address]    RPR Ser Ql 06/07/2022 NON REACTIVE  NON REACTIVE Final   Performed at [facility], [address] [place], [place] [address]   Color, Urine 06/07/2022 YELLOW  YELLOW Final   APPearance 06/07/2022 HAZY (A)  CLEAR Final   Specific Gravity, Urine 06/07/2022 1.018  1.005 - 1.030 Final   pH 06/07/2022 7.0  5.0 - 8.0 Final   Glucose, UA 06/07/2022 NEGATIVE  NEGATIVE  mg/dL Final   Hgb urine dipstick 06/07/2022 NEGATIVE  NEGATIVE Final   Bilirubin Urine 06/07/2022 NEGATIVE  NEGATIVE Final   Ketones, ur 06/07/2022 NEGATIVE  NEGATIVE mg/dL Final   Protein, ur 66/44/0347 NEGATIVE  NEGATIVE mg/dL Final   Nitrite 42/59/5638 NEGATIVE  NEGATIVE Final   Leukocytes,Ua 06/07/2022 NEGATIVE  NEGATIVE Final   Performed at [facility], [address], [place], [place] [address]   Cholesterol 06/07/2022 164  0 - 200 mg/dL Final   Triglycerides 32/95/1884 158 (H)  <150 mg/dL Final   HDL 16/60/6301 44  >40 mg/dL Final   Total CHOL/HDL Ratio 06/07/2022 3.7  RATIO Final   VLDL 06/07/2022 32  0 - 40 mg/dL Final   LDL Cholesterol 06/07/2022 88  0 - 99 mg/dL Final   Comment:        Total Cholesterol/HDL:CHD Risk Coronary Heart Disease Risk Table                     Men   Women  1/2 Average Risk   3.4   3.3  Average Risk       5.0   4.4  2 X Average Risk   9.6   7.1  3 X Average Risk  23.4   11.0        Use the calculated Patient  Ratio above and the CHD Risk Table to determine the patient's CHD Risk.        ATP III CLASSIFICATION (LDL):  <100     mg/dL   Optimal  100-129  mg/dL   Near or Above                    Optimal  130-159  mg/dL   Borderline  160-189  mg/dL   High  >190     mg/dL   Very High Performed at [place] [address], [place], [place] [address]    HIV Screen 4th Generation wRfx 06/07/2022 Non Reactive  Non Reactive Final   Performed at [facility], [place] [address] [place], [place] [address]   SARSCOV2ONAVIRUS 2 AG 06/07/2022 NEGATIVE  NEGATIVE Final   Comment: (NOTE) SARS-CoV-2 antigen NOT DETECTED.   Negative results are presumptive.  Negative results do not preclude SARS-CoV-2 infection and should not be used as the sole basis for treatment or other patient management decisions, including infection  control decisions, particularly in the presence of clinical signs and  symptoms consistent with COVID-19, or in  those who have been in contact with the virus.  Negative results must be combined with clinical observations, patient history, and epidemiological information. The expected result is Negative.  Fact Sheet for Patients: HandmadeRecipes.com.cy  Fact Sheet for Healthcare Providers: FuneralLife.at  This test is not yet approved or cleared by the [place] FDA and  has been authorized for detection and/or diagnosis of SARS-CoV-2 by FDA under an Emergency Use Authorization (EUA).  This EUA will remain in effect (meaning this test can be used) for the duration of  the COV                          ID-19 declaration under Section 564(b)(1) of the Act, 21 U.S.C. section 360bbb-3(b)(1), unless the authorization is terminated or revoked sooner.     POC Amphetamine UR 06/07/2022 None Detected  NONE DETECTED (Cut Off Level 1000 ng/mL) Final   POC Secobarbital (BAR) 06/07/2022 None Detected  NONE DETECTED (Cut Off Level 300 ng/mL) Final   POC Buprenorphine (BUP) 06/07/2022 None Detected  NONE DETECTED (Cut Off Level 10 ng/mL) Final   POC Oxazepam (BZO) 06/07/2022 None Detected  NONE DETECTED (Cut Off Level 300 ng/mL) Final   POC Cocaine UR 06/07/2022 None Detected  NONE DETECTED (Cut Off Level 300 ng/mL) Final   POC Methamphetamine UR 06/07/2022 None Detected  NONE DETECTED (Cut Off Level 1000 ng/mL) Final   POC Morphine 06/07/2022 None Detected  NONE DETECTED (Cut Off Level 300 ng/mL) Final   POC Methadone UR 06/07/2022 None Detected  NONE DETECTED (Cut Off Level 300 ng/mL) Final   POC Oxycodone UR 06/07/2022 None Detected  NONE DETECTED (Cut Off Level 100 ng/mL) Final   POC Marijuana UR 06/07/2022 None Detected  NONE DETECTED (Cut Off Level 50 ng/mL) Final   Preg Test, Ur 06/08/2022 NEGATIVE  NEGATIVE Final   Comment:        THE SENSITIVITY OF THIS METHODOLOGY IS >20 mIU/mL. Performed at [facility], [place] [address], [place],   [address]    Preg Test, Ur 06/08/2022 NEGATIVE  NEGATIVE Final   Comment:        THE SENSITIVITY OF THIS METHODOLOGY IS >24 mIU/mL     Blood Alcohol level:  Lab Results  Component Value Date   ETH <10 07/04/2022  ETH <10 02/07/2022    Metabolic Disorder Labs: Lab Results  Component Value Date   HGBA1C 5.7 (H) 10/15/2022   MPG 117 10/15/2022   MPG 96.8 06/07/2022   No results found for: "PROLACTIN" Lab Results  Component Value Date   CHOL 181 07/25/2022   TRIG 118 07/25/2022   HDL 45 07/25/2022   CHOLHDL 4.0 07/25/2022   VLDL 24 07/25/2022   LDLCALC 112 (H) 07/25/2022   LDLCALC 88 06/07/2022    Therapeutic Lab Levels: No results found for: "LITHIUM" No results found for: "VALPROATE" No results found for: "CBMZ"  Physical Findings   GAD-7    Flowsheet Row Office Visit from 12/17/2021 in [facility] for [facility] [place]  Total GAD-7 Score 17      PHQ2-9    Flowsheet Row ED from 10/07/2022 in [facility] ED from 06/07/2022 in [facility] Office Visit from 12/17/2021 in [facility] [facility] Total Score 0 1 2  PHQ-9 Total Score -- 2 8      Flowsheet Row ED from 10/07/2022 in [facility] Most recent reading at 11/07/2022  9:32 AM ED from 10/07/2022 in [facility] Emergency Department at [facility] Most recent reading at 10/07/2022 12:18 PM ED from 07/25/2022 in [facility] Most recent reading at 10/06/2022  9:38 AM  C-SSRS RISK CATEGORY No Risk No Risk No Risk        Musculoskeletal  Strength & Muscle Tone: within normal limits Gait & Station: normal Patient leans: N/A  Psychiatric Specialty Exam  Presentation  General Appearance:  Appropriate for Environment; Casual  Eye Contact: Good  Speech: Clear and Coherent; Normal Rate  Speech  Volume: Normal  Handedness: Right   Mood and Affect  Mood: Euthymic  Affect: Full Range   Thought Process  Thought Processes: Coherent; Goal Directed; Linear  Descriptions of Associations:Intact  Orientation:Full (Time, Place and Person)  Thought Content:Logical  Diagnosis of Schizophrenia or Schizoaffective disorder in past: Yes  Duration of Psychotic Symptoms: Greater than six months   Hallucinations:Hallucinations: None  Ideas of Reference:None  Suicidal Thoughts:Suicidal Thoughts: No  Homicidal Thoughts:Homicidal Thoughts: No   Sensorium  Memory: Immediate Good  Judgment: Intact  Insight: Present   Executive Functions  Concentration: Good  Attention Span: Good  Recall: Good  Fund of Knowledge: Good  Language: Good   Psychomotor Activity  Psychomotor Activity: Psychomotor Activity: Normal   Assets  Assets: Communication Skills; Desire for Improvement; Financial Resources/Insurance; Leisure Time; Physical Health; Resilience; Social Support   Sleep  Sleep: Sleep: Good   No data recorded  Physical Exam  Physical Exam Constitutional:      General: She is not in acute distress.    Appearance: She is not ill-appearing, toxic-appearing or diaphoretic.  Eyes:     General: No scleral icterus. Cardiovascular:     Rate and Rhythm: Normal rate.  Pulmonary:     Effort: Pulmonary effort is normal. No respiratory distress.  Neurological:     Mental Status: She is alert and oriented to person, place, and time.  Psychiatric:        Attention and Perception: Attention and perception normal.        Mood and Affect: Mood and affect normal.        Speech: Speech normal.        Behavior: Behavior normal. Behavior is cooperative.  Thought Content: Thought content normal.    Review of Systems  Constitutional:  Negative for chills and fever.  Respiratory:  Negative for shortness of breath.   Cardiovascular:  Negative for chest  pain and palpitations.  Gastrointestinal:  Negative for abdominal pain.  Neurological:  Negative for headaches.   Blood pressure (!) 107/91, pulse 91, temperature 98.8 F (37.1 C), temperature source Oral, resp. rate 18, height 5\' 1"  (1.549 m), weight 198 lb (89.8 kg), SpO2 100 %. Body mass index is 37.41 kg/m.  Treatment Plan Summary: Plan Pt remains psychiatrically cleared. Pt has been accepted to transitional housing, approved by legal guardian. Discharge pending on Saturday.  Lauree Chandler, NP 11/21/2022 8:44 AM

## 2022-11-21 NOTE — ED Notes (Signed)
Pt alert at this hour. No apparent distress. RR even and unlabored. Monitored for safety.

## 2022-11-21 NOTE — ED Notes (Signed)
Pt asleep at this hour. No apparent distress. RR even and unlabored. Monitored for safety.

## 2022-11-22 DIAGNOSIS — F209 Schizophrenia, unspecified: Secondary | ICD-10-CM | POA: Diagnosis not present

## 2022-11-22 DIAGNOSIS — N9489 Other specified conditions associated with female genital organs and menstrual cycle: Secondary | ICD-10-CM | POA: Diagnosis not present

## 2022-11-22 DIAGNOSIS — F419 Anxiety disorder, unspecified: Secondary | ICD-10-CM | POA: Diagnosis not present

## 2022-11-22 DIAGNOSIS — E039 Hypothyroidism, unspecified: Secondary | ICD-10-CM | POA: Diagnosis not present

## 2022-11-22 DIAGNOSIS — F17200 Nicotine dependence, unspecified, uncomplicated: Secondary | ICD-10-CM | POA: Diagnosis not present

## 2022-11-22 DIAGNOSIS — N390 Urinary tract infection, site not specified: Secondary | ICD-10-CM | POA: Diagnosis not present

## 2022-11-22 DIAGNOSIS — E118 Type 2 diabetes mellitus with unspecified complications: Secondary | ICD-10-CM | POA: Diagnosis not present

## 2022-11-22 DIAGNOSIS — F333 Major depressive disorder, recurrent, severe with psychotic symptoms: Secondary | ICD-10-CM | POA: Diagnosis not present

## 2022-11-22 DIAGNOSIS — Z1152 Encounter for screening for COVID-19: Secondary | ICD-10-CM | POA: Diagnosis not present

## 2022-11-22 MED ORDER — OXCARBAZEPINE 300 MG PO TABS: 300.0000 mg | ORAL_TABLET | Freq: Two times a day (BID) | ORAL | 0 refills | Status: DC

## 2022-11-22 MED ORDER — SERTRALINE HCL 50 MG PO TABS: 150.0000 mg | ORAL_TABLET | Freq: Every day | ORAL | 0 refills | Status: DC

## 2022-11-22 MED ORDER — NICOTINE 14 MG/24HR TD PT24: 14.0000 mg | MEDICATED_PATCH | Freq: Every day | TRANSDERMAL | 0 refills | Status: AC | PRN

## 2022-11-22 MED ORDER — TRAZODONE HCL 100 MG PO TABS: 100.0000 mg | ORAL_TABLET | Freq: Every day | ORAL | 0 refills | Status: DC

## 2022-11-22 MED ORDER — PANTOPRAZOLE SODIUM 40 MG PO TBEC: 40.0000 mg | DELAYED_RELEASE_TABLET | Freq: Every day | ORAL | 0 refills | Status: DC

## 2022-11-22 MED ORDER — LEVOTHYROXINE SODIUM 100 MCG PO TABS: 100.0000 ug | ORAL_TABLET | Freq: Every day | ORAL | 0 refills | Status: DC

## 2022-11-22 MED ORDER — VALACYCLOVIR HCL 500 MG PO TABS: 500.0000 mg | ORAL_TABLET | Freq: Every day | ORAL | 0 refills | Status: AC

## 2022-11-22 MED ORDER — METFORMIN HCL 500 MG PO TABS: 500.0000 mg | ORAL_TABLET | Freq: Every day | ORAL | 0 refills | Status: DC

## 2022-11-22 MED ORDER — QUETIAPINE FUMARATE 400 MG PO TABS: 400.0000 mg | ORAL_TABLET | Freq: Two times a day (BID) | ORAL | 0 refills | Status: DC

## 2022-11-22 NOTE — ED Notes (Signed)
Patient alert and oriented x 3. Denies SI/HI/AVH. Denies intent or plan to harm self or others. Routine conducted according to faculty protocol. Encourage patient to notify staff with any needs or concerns. Patient verbalized agreement and understanding. Will continue to monitor for safety. 

## 2022-11-22 NOTE — ED Notes (Signed)
Pt is in the bed sleeping. Respirations are even and unlabored. No acute distress noted. Will continue to monitor for safety. 

## 2022-11-22 NOTE — ED Notes (Signed)
Pt asleep at this hour. No apparent distress. RR even and unlabored. Monitored for safety.

## 2022-11-22 NOTE — ED Notes (Signed)
Pt was given a sandwich, chips, and juice for lunch.

## 2022-11-22 NOTE — ED Provider Notes (Signed)
FBC/OBS ASAP Discharge Summary  Date and Time: 11/22/2022 4:26 PM  Name: Nicole [name]  MRN:  [id number]   Discharge Diagnoses:  Final diagnoses:  Tobacco use disorder  Schizophrenia, unspecified type ([place])  MDD (major depressive disorder), recurrent episode, moderate ([facility])  Hypothyroidism, unspecified type   Subjective:   On approach, pt appears with bright affect. She reports euthymic mood, is looking forward to discharge. She denies suicidal, homicidal or violent ideations. She denies auditory visual hallucinations or paranoia. She verbalizes intent to avoid future admissions or inpatient psychiatric hospitalizations. She states she spoke with her father and she does not want to lose future placements. She denies any concerns today, states she is going to be busy doing her hair.   Stay Summary:  Nicole [name] is a 32 y/o female with PMH of SCZ unspecified, MDD with psychosis, who presented voluntarily to [facility] (07/23/2022) via [employer] after a verbal altercation with [name] (not patient's legal guardian) for transient SI. This is patient's fourth visit to [facility] for similar concerns this year. Patient has been dismissed from previous group home due to threatening SI and HI, then leaving the group home. 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 She is being discharged to transitional housing.   Total Time spent with patient: 20 minutes  Past Psychiatric History: SCZ unspecified, MDD with psychosis, anxiety, depression, tobacco use disorder Past Medical History: Hypothyroidism  Family History: Hypertension - father diabetes Family Psychiatric History: None reported Social History: Has been accepted into transition housing Tobacco Cessation:  A prescription for an FDA-approved tobacco cessation medication provided at discharge  Current Medications:  Current Facility-Administered Medications  Medication Dose Route Frequency Provider Last Rate Last Admin    acetaminophen (TYLENOL) tablet 650 mg  650 mg Oral Q6H PRN Haynes Kerns, NP   650 mg at 11/18/22 2114   alum & mag hydroxide-simeth (MAALOX/MYLANTA) 200-200-20 MG/5ML suspension 30 mL  30 mL Oral Q4H PRN Haynes Kerns, NP   30 mL at 11/16/22 1849   ARIPiprazole ER (ABILIFY MAINTENA) injection 400 mg  400 mg Intramuscular Q28 days Merrily Brittle, DO   400 mg at 11/22/22 1101   cetaphil lotion   Topical PRN Merrily Brittle, DO       fluticasone (FLONASE) 50 MCG/ACT nasal spray 1 spray  1 spray Each Nare QHS Tharon Aquas, NP   1 spray at 11/21/22 2113   hydrocortisone cream 1 %   Topical QID PRN Onuoha, Chinwendu V, NP   Given at 11/14/22 [id number]   hydrOXYzine (ATARAX) tablet 25 mg  25 mg Oral TID PRN Christene Slates, MD   25 mg at 11/19/22 2152   ketoconazole (NIZORAL) 2 % cream   Topical BID Merrily Brittle, DO   Given at 11/22/22 1034   levothyroxine (SYNTHROID) tablet 100 mcg  100 mcg Oral Once Byungura, Veronique M, NP       levothyroxine (SYNTHROID) tablet 100 mcg  100 mcg Oral Q0600 Haynes Kerns, NP   100 mcg at 11/22/22 0600   lidocaine (LIDODERM) 5 % 1 patch  1 patch Transdermal Q24H Merrily Brittle, DO   1 patch at 11/22/22 1035   loratadine (CLARITIN) tablet 10 mg  10 mg Oral Daily Carrion-Carrero, Margely, MD   10 mg at 11/22/22 1036   magnesium hydroxide (MILK OF MAGNESIA) suspension 30 mL  30 mL Oral Daily PRN Haynes Kerns, NP   30 mL at 10/12/22 2145   metFORMIN (GLUCOPHAGE) tablet 500 mg  500 mg Oral Q breakfast Carrion-Carrero, Margely, MD   500 mg at 11/22/22 1035   nicotine (NICODERM CQ - dosed in mg/24 hours) patch 14 mg  14 mg Transdermal Daily PRN Carrion-Carrero, Caryl Ada, MD       ondansetron (ZOFRAN-ODT) disintegrating tablet 4 mg  4 mg Oral Q8H PRN Carrion-Carrero, Margely, MD   4 mg at 11/16/22 1849   Oxcarbazepine (TRILEPTAL) tablet 300 mg  300 mg Oral BID Byungura, Veronique M, NP   300 mg at 11/22/22 1036   pantoprazole (PROTONIX) EC  tablet 40 mg  40 mg Oral Daily Carrion-Carrero, Margely, MD   40 mg at 11/22/22 1036   polyethylene glycol (MIRALAX / GLYCOLAX) packet 17 g  17 g Oral Daily Merrily Brittle, DO   17 g at 11/22/22 1035   QUEtiapine (SEROQUEL) tablet 400 mg  400 mg Oral BID Byungura, Veronique M, NP   400 mg at 11/22/22 1035   sertraline (ZOLOFT) tablet 150 mg  150 mg Oral Daily Carrion-Carrero, Margely, MD   150 mg at 11/22/22 1035   sodium chloride (OCEAN) 0.65 % nasal spray 1 spray  1 spray Each Nare Daily Merrily Brittle, DO   1 spray at 11/22/22 1034   traZODone (DESYREL) tablet 100 mg  100 mg Oral QHS Ronelle Nigh M, NP   100 mg at 11/21/22 2114   traZODone (DESYREL) tablet 50 mg  50 mg Oral QHS PRN Haynes Kerns, NP   50 mg at 11/21/22 2114   valACYclovir (VALTREX) tablet 500 mg  500 mg Oral Daily Carrion-Carrero, Caryl Ada, MD   500 mg at 11/22/22 1035   zinc oxide (BALMEX) 11.3 % cream   Topical PRN Merrily Brittle, DO       Current Outpatient Medications  Medication Sig Dispense Refill   ABILIFY MAINTENA 400 MG PRSY prefilled syringe 400 mg every 28 (twenty-eight) days.     [START ON 11/23/2022] levothyroxine (SYNTHROID) 100 MCG tablet Take 1 tablet (100 mcg total) by mouth daily at 6 (six) AM. 30 tablet 0   [START ON 11/23/2022] metFORMIN (GLUCOPHAGE) 500 MG tablet Take 1 tablet (500 mg total) by mouth daily with breakfast. 30 tablet 0   nicotine (NICODERM CQ - DOSED IN MG/24 HOURS) 14 mg/24hr patch Place 1 patch (14 mg total) onto the skin daily as needed (prn for tobacco cessation and nicotine cravings). 28 patch 0   Oxcarbazepine (TRILEPTAL) 300 MG tablet Take 1 tablet (300 mg total) by mouth 2 (two) times daily. 60 tablet 0   [START ON 11/23/2022] pantoprazole (PROTONIX) 40 MG tablet Take 1 tablet (40 mg total) by mouth daily. 30 tablet 0   QUEtiapine (SEROQUEL) 400 MG tablet Take 1 tablet (400 mg total) by mouth 2 (two) times daily. 60 tablet 0   sertraline (ZOLOFT) 50 MG tablet Take 3 tablets  (150 mg total) by mouth in the morning. 90 tablet 0   traZODone (DESYREL) 100 MG tablet Take 1 tablet (100 mg total) by mouth at bedtime. 30 tablet 0   valACYclovir (VALTREX) 500 MG tablet Take 1 tablet (500 mg total) by mouth daily. 30 tablet 0    PTA Medications:  Facility Ordered Medications  Medication   [COMPLETED] nitrofurantoin (macrocrystal-monohydrate) (MACROBID) capsule 100 mg   [COMPLETED] ibuprofen (ADVIL) tablet 600 mg   acetaminophen (TYLENOL) tablet 650 mg   alum & mag hydroxide-simeth (MAALOX/MYLANTA) 200-200-20 MG/5ML suspension 30 mL   magnesium hydroxide (MILK OF MAGNESIA) suspension 30 mL   traZODone (DESYREL) tablet 50 mg  traZODone (DESYREL) tablet 100 mg   Oxcarbazepine (TRILEPTAL) tablet 300 mg   QUEtiapine (SEROQUEL) tablet 400 mg   levothyroxine (SYNTHROID) tablet 100 mcg   [COMPLETED] nitrofurantoin (macrocrystal-monohydrate) (MACROBID) capsule 100 mg   levothyroxine (SYNTHROID) tablet 100 mcg   [COMPLETED] nitrofurantoin (macrocrystal-monohydrate) (MACROBID) capsule 100 mg   valACYclovir (VALTREX) tablet 500 mg   pantoprazole (PROTONIX) EC tablet 40 mg   ondansetron (ZOFRAN-ODT) disintegrating tablet 4 mg   nicotine (NICODERM CQ - dosed in mg/24 hours) patch 14 mg   metFORMIN (GLUCOPHAGE) tablet 500 mg   loratadine (CLARITIN) tablet 10 mg   hydrOXYzine (ATARAX) tablet 25 mg   [COMPLETED] OLANZapine (ZYPREXA) tablet 10 mg   [COMPLETED] polyethylene glycol (MIRALAX / GLYCOLAX) packet 17 g   cetaphil lotion   polyethylene glycol (MIRALAX / GLYCOLAX) packet 17 g   [COMPLETED] carbamide peroxide (DEBROX) 6.5 % OTIC (EAR) solution 5 drop   [COMPLETED] carbamide peroxide (DEBROX) 6.5 % OTIC (EAR) solution 5 drop   sertraline (ZOLOFT) tablet 150 mg   [COMPLETED] carbamide peroxide (DEBROX) 6.5 % OTIC (EAR) solution 5 drop   sodium chloride (OCEAN) 0.65 % nasal spray 1 spray   lidocaine (LIDODERM) 5 % 1 patch   ARIPiprazole ER (ABILIFY MAINTENA) injection 400  mg   ketoconazole (NIZORAL) 2 % cream   zinc oxide (BALMEX) 11.3 % cream   fluticasone (FLONASE) 50 MCG/ACT nasal spray 1 spray   [COMPLETED] fluconazole (DIFLUCAN) tablet 150 mg   hydrocortisone cream 1 %   PTA Medications  Medication Sig   ABILIFY MAINTENA 400 MG PRSY prefilled syringe 400 mg every 28 (twenty-eight) days.   valACYclovir (VALTREX) 500 MG tablet Take 1 tablet (500 mg total) by mouth daily.   nicotine (NICODERM CQ - DOSED IN MG/24 HOURS) 14 mg/24hr patch Place 1 patch (14 mg total) onto the skin daily as needed (prn for tobacco cessation and nicotine cravings).   QUEtiapine (SEROQUEL) 400 MG tablet Take 1 tablet (400 mg total) by mouth 2 (two) times daily.   traZODone (DESYREL) 100 MG tablet Take 1 tablet (100 mg total) by mouth at bedtime.   [START ON 11/23/2022] levothyroxine (SYNTHROID) 100 MCG tablet Take 1 tablet (100 mcg total) by mouth daily at 6 (six) AM.   [START ON 11/23/2022] metFORMIN (GLUCOPHAGE) 500 MG tablet Take 1 tablet (500 mg total) by mouth daily with breakfast.   [START ON 11/23/2022] pantoprazole (PROTONIX) 40 MG tablet Take 1 tablet (40 mg total) by mouth daily.   Oxcarbazepine (TRILEPTAL) 300 MG tablet Take 1 tablet (300 mg total) by mouth 2 (two) times daily.       10/28/2022    3:57 PM 10/26/2022   10:49 AM 06/10/2022    9:53 AM  Depression screen PHQ 2/9  Decreased Interest 0 0 0  Down, Depressed, Hopeless 0 0 1  PHQ - 2 Score 0 0 1  Altered sleeping   0  Tired, decreased energy   0  Change in appetite   0  Feeling bad or failure about yourself    0  Trouble concentrating   1  Moving slowly or fidgety/restless   0  Suicidal thoughts   0  PHQ-9 Score   2  Difficult doing work/chores   Not difficult at all    [place] ED from 10/07/2022 in [facility] Most recent reading at 11/22/2022  8:48 AM ED from 10/07/2022 in [facility] Emergency Department at [facility] Most recent reading at 10/07/2022 12:18  PM ED from 07/25/2022 in [facility] Most recent reading at 10/06/2022  9:38 AM  C-SSRS RISK CATEGORY No Risk No Risk No Risk       Musculoskeletal  Strength & Muscle Tone: within normal limits Gait & Station: normal Patient leans: N/A  Psychiatric Specialty Exam  Presentation  General Appearance:  Appropriate for Environment; Casual  Eye Contact: Good  Speech: Clear and Coherent; Normal Rate  Speech Volume: Normal  Handedness: Right   Mood and Affect  Mood: Euthymic  Affect: Full Range   Thought Process  Thought Processes: Coherent; Goal Directed; Linear  Descriptions of Associations:Intact  Orientation:Full (Time, Place and Person)  Thought Content:Logical  Diagnosis of Schizophrenia or Schizoaffective disorder in past: Yes  Duration of Psychotic Symptoms: Greater than six months   Hallucinations:Hallucinations: None  Ideas of Reference:None  Suicidal Thoughts:Suicidal Thoughts: No  Homicidal Thoughts:Homicidal Thoughts: No   Sensorium  Memory: Immediate Good  Judgment: Intact  Insight: Present   Executive Functions  Concentration: Good  Attention Span: Good  Recall: Good  Fund of Knowledge: Good  Language: Good   Psychomotor Activity  Psychomotor Activity:Psychomotor Activity: Normal   Assets  Assets: Communication Skills; Desire for Improvement; Financial Resources/Insurance; Leisure Time; Physical Health; Resilience; Social Support   Sleep  Sleep: Sleep: Good   No data recorded  Physical Exam  Physical Exam Constitutional:      General: She is not in acute distress.    Appearance: She is not ill-appearing, toxic-appearing or diaphoretic.  Eyes:     General: No scleral icterus. Cardiovascular:     Rate and Rhythm: Normal rate.  Pulmonary:     Effort: Pulmonary effort is normal. No respiratory distress.  Neurological:     Mental Status: She is alert and oriented to  person, place, and time.  Psychiatric:        Attention and Perception: Attention and perception normal.        Mood and Affect: Mood and affect normal.        Speech: Speech normal.        Behavior: Behavior normal. Behavior is cooperative.        Thought Content: Thought content normal.    Review of Systems  Constitutional:  Negative for chills and fever.  Respiratory:  Negative for shortness of breath.   Cardiovascular:  Negative for chest pain and palpitations.  Gastrointestinal:  Negative for abdominal pain.  Neurological:  Negative for headaches.  Psychiatric/Behavioral:  Negative for hallucinations and suicidal ideas.    Blood pressure 108/75, pulse 93, temperature 97.6 F (36.4 C), temperature source Oral, resp. rate 16, height 5' 1"$  (1.549 m), weight 198 lb (89.8 kg), SpO2 100 %. Body mass index is 37.41 kg/m.  Demographic Factors:  Unemployed  Loss Factors: NA  Historical Factors: NA  Risk Reduction Factors:   Sense of responsibility to family and Positive social support  Continued Clinical Symptoms:  Previous Psychiatric Diagnoses and Treatments  Cognitive Features That Contribute To Risk:  None    Suicide Risk:  Minimal: No identifiable suicidal ideation.  Patients presenting with no risk factors but with morbid ruminations; may be classified as minimal risk based on the severity of the depressive symptoms  Plan Of Care/Follow-up recommendations:  Follow up with outpatient services Appointments are scheduled for 12/13/2022 for medication management (10:30AM) and counseling (8:00AM) at [facility]  Disposition:  Discharge to transitional housing   Tharon Aquas, NP 11/22/2022, 4:26 PM

## 2022-11-22 NOTE — ED Notes (Signed)
Patient  sleeping in no acute stress. RR even and unlabored .Environment secured .Will continue to monitor for safely. 

## 2022-11-22 NOTE — ED Notes (Signed)
Pt is currently watching TV. Snacks given. Respirations are even and unlabored. No acute distress noted. Will continue to monitor for safety.

## 2022-11-23 DIAGNOSIS — N390 Urinary tract infection, site not specified: Secondary | ICD-10-CM | POA: Diagnosis not present

## 2022-11-23 DIAGNOSIS — N9489 Other specified conditions associated with female genital organs and menstrual cycle: Secondary | ICD-10-CM | POA: Diagnosis not present

## 2022-11-23 DIAGNOSIS — F333 Major depressive disorder, recurrent, severe with psychotic symptoms: Secondary | ICD-10-CM | POA: Diagnosis not present

## 2022-11-23 DIAGNOSIS — Z1152 Encounter for screening for COVID-19: Secondary | ICD-10-CM | POA: Diagnosis not present

## 2022-11-23 DIAGNOSIS — F209 Schizophrenia, unspecified: Secondary | ICD-10-CM | POA: Diagnosis not present

## 2022-11-23 DIAGNOSIS — E039 Hypothyroidism, unspecified: Secondary | ICD-10-CM | POA: Diagnosis not present

## 2022-11-23 DIAGNOSIS — E118 Type 2 diabetes mellitus with unspecified complications: Secondary | ICD-10-CM | POA: Diagnosis not present

## 2022-11-23 DIAGNOSIS — F17200 Nicotine dependence, unspecified, uncomplicated: Secondary | ICD-10-CM | POA: Diagnosis not present

## 2022-11-23 DIAGNOSIS — F419 Anxiety disorder, unspecified: Secondary | ICD-10-CM | POA: Diagnosis not present

## 2022-11-23 NOTE — ED Notes (Signed)
[name]&O x 4, ambulatory. Patient discharged in no acute distress. Patient denied SI/HI, A/VH upon discharge. Patient verbalized understanding of all discharge instructions explained by staff, to include follow up appointments, RX's and safety plan. Patient reported mood 10/10.  Pt belongings returned to patient from locker # 31 intact. Patient escorted to lobby via staff for transport to destination. Safety maintained. Rn explained medication and discharge information to the caregiver also.

## 2022-11-23 NOTE — ED Notes (Signed)
Pt is in the bed sleeping. Respirations are even and unlabored. No acute distress noted. Will continue to monitor for safety. 

## 2022-11-23 NOTE — ED Notes (Signed)
Patient alert and oriented x 3. Denies SI/HI/AVH. Denies intent or plan to harm self or others. Routine conducted according to faculty protocol. Encourage patient to notify staff with any needs or concerns. Patient verbalized agreement and understanding. Will continue to monitor for safety. 

## 2022-11-27 NOTE — Progress Notes (Signed)
Patient has diabetes

## 2022-12-04 ENCOUNTER — Ambulatory Visit (HOSPITAL_COMMUNITY)
Admission: EM | Admit: 2022-12-04 | Discharge: 2022-12-04 | Disposition: A | Discharge status: Home / Self Care | Payer: Medicare HMO | Attending: Nurse Practitioner | Admitting: Nurse Practitioner

## 2022-12-04 ENCOUNTER — Other Ambulatory Visit (HOSPITAL_COMMUNITY): Payer: Self-pay | Admitting: Physician Assistant

## 2022-12-04 ENCOUNTER — Ambulatory Visit (INDEPENDENT_AMBULATORY_CARE_PROVIDER_SITE_OTHER): Payer: Medicare HMO | Admitting: Physician Assistant

## 2022-12-04 ENCOUNTER — Ambulatory Visit (INDEPENDENT_AMBULATORY_CARE_PROVIDER_SITE_OTHER): Payer: Medicare HMO | Admitting: Clinical

## 2022-12-04 VITALS — BP 122/90 | HR 94 | Temp 99.1°F | Ht 61.0 in | Wt 225.0 lb

## 2022-12-04 DIAGNOSIS — G4700 Insomnia, unspecified: Secondary | ICD-10-CM | POA: Diagnosis not present

## 2022-12-04 DIAGNOSIS — F419 Anxiety disorder, unspecified: Secondary | ICD-10-CM | POA: Diagnosis not present

## 2022-12-04 DIAGNOSIS — F200 Paranoid schizophrenia: Secondary | ICD-10-CM

## 2022-12-04 DIAGNOSIS — F99 Mental disorder, not otherwise specified: Secondary | ICD-10-CM | POA: Diagnosis not present

## 2022-12-04 DIAGNOSIS — F209 Schizophrenia, unspecified: Secondary | ICD-10-CM

## 2022-12-04 DIAGNOSIS — F411 Generalized anxiety disorder: Secondary | ICD-10-CM

## 2022-12-04 DIAGNOSIS — Z760 Encounter for issue of repeat prescription: Secondary | ICD-10-CM | POA: Diagnosis not present

## 2022-12-04 DIAGNOSIS — F5105 Insomnia due to other mental disorder: Secondary | ICD-10-CM | POA: Diagnosis not present

## 2022-12-04 DIAGNOSIS — F430 Acute stress reaction: Secondary | ICD-10-CM | POA: Diagnosis not present

## 2022-12-04 DIAGNOSIS — F331 Major depressive disorder, recurrent, moderate: Secondary | ICD-10-CM

## 2022-12-04 MED ORDER — LORAZEPAM 1 MG PO TABS
1.0000 mg | ORAL_TABLET | Freq: Once | ORAL | Status: AC
  Administered 2022-12-04: 1 mg via ORAL
  Filled 2022-12-04: qty 1

## 2022-12-04 MED ORDER — QUETIAPINE FUMARATE 50 MG PO TABS: ORAL_TABLET | ORAL | 0 refills | Status: DC

## 2022-12-04 MED ORDER — QUETIAPINE FUMARATE 400 MG PO TABS: 400.0000 mg | ORAL_TABLET | Freq: Two times a day (BID) | ORAL | 1 refills | Status: DC

## 2022-12-04 MED ORDER — TRAZODONE HCL 100 MG PO TABS: 100.0000 mg | ORAL_TABLET | Freq: Every day | ORAL | 1 refills | Status: DC

## 2022-12-04 MED ORDER — SERTRALINE HCL 50 MG PO TABS: ORAL_TABLET | ORAL | 0 refills | Status: DC

## 2022-12-04 MED ORDER — OXCARBAZEPINE 300 MG PO TABS: 300.0000 mg | ORAL_TABLET | Freq: Two times a day (BID) | ORAL | 1 refills | Status: DC

## 2022-12-04 MED ORDER — SERTRALINE HCL 50 MG PO TABS: 150.0000 mg | ORAL_TABLET | Freq: Every day | ORAL | 1 refills | Status: DC

## 2022-12-04 NOTE — ED Provider Notes (Addendum)
Behavioral [facility] Urgent Care Medical Screening Exam  Patient Name: Nicole [name] MRN: [id number] Date of Evaluation: 12/04/22 Chief Complaint:  "I can't sleep".  Diagnosis:  Final diagnoses:  Anxiety  Insomnia, unspecified type  Encounter for medication refill    History of Present illness: Nicole [name] is a 32 y.o. female with PMH of SCZ unspecified, MDD with psychosis, who presented voluntarily to [facility] via [employer] after she called the crisis line with complaints of insomnia and was advised to call [employer] for transport if she needed to present herself to the [facility]. On arrival to the [facility], patient reports she is anxious, experiencing AH and insomnia as she had run out of her medications for the past one week.  Patient was seen face to face by this provider and chart reviewed. Per chart review, patient was recently discharged from the [facility] on 11/23/22 where she has remained a boarder for four months after she presented on (07/23/2022) via [employer] after a verbal altercation with [name] (not patient's legal guardian) for transient SI, which was patient's fourth visit to [facility] for similar concerns last year. 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   Tonight, patient reports "I've not been taking my meds for a week and I've run out of all of them, and I tried to deal with it but I can't sleep, and I called [employer] on Friday and they said it will take 7-10 business days before I can get my meds, but I keep having anxiety attacks".   Patient reports she is currently living at [facility]. in a transitional home, 'like independent living 'with 2 other women that are living in the house. Patient reports  feeling safe returning home tonight, and will F/u with the walk in clinic in the am for medication refill.  Patient denies illicit substance use or alcohol use. Patient denies access or means to a gun or weapon.  Patient denies suicidal or homicidal ideations.  Patient endorses auditory hallucinations and reports "crying and them telling me that my friends are out to get me".  Patient denies visual hallucinations.  On evaluation, patient is alert, oriented x 3, and cooperative. Speech is clear and coherent. Pt appears well groomed. Eye contact is fair. Mood is anxious, affect is congruent with mood. Thought process coherent and thought content is WDL. Pt denies SI/HI/VH. There is no objective indication that the patient is responding to internal stimuli. No delusions elicited during this assessment.    Discussed plan to administer Ativan p.o. x 1 for anxiety,  D/C home and follow-up with [facility] outpatient walk-in clinic for medication management in the a.m. Patient is in agreement.  Discussed proactive medication management/refill measures such as refilling meds when current supplies are halfway instead of waiting till medications are completely finished. Patient verbalized her understanding.  [facility] from 10/07/2022 in [facility] Most recent reading at 11/22/2022  8:48 AM ED from 10/07/2022 in [facility] at [facility] Most recent reading at 10/07/2022 12:18 PM ED from 07/25/2022 in [facility] Most recent reading at 10/06/2022  9:38 AM  C-SSRS RISK CATEGORY No Risk No Risk No Risk       Psychiatric Specialty  Exam  Presentation  General Appearance:Appropriate for Environment  Eye Contact:Fair  Speech:Clear and Coherent  Speech Volume:Normal  Handedness:Right   Mood and Affect  Mood: Anxious  Affect: Congruent   Thought Process  Thought Processes: Coherent  Descriptions of  Associations:Intact  Orientation:Full (Time, Place and Person)  Thought Content:WDL  Diagnosis of Schizophrenia or Schizoaffective disorder in past: Yes  Duration of Psychotic Symptoms: Greater than six months  Hallucinations:Auditory Pt reports hearing voices elling her her friend is talking about her denies  Ideas of Reference:None  Suicidal Thoughts:No -- (Denied)  Homicidal Thoughts:No -- (Denied) -- (Denied)   Sensorium  Memory: Immediate Fair  Judgment: Intact  Insight: Present   Executive Functions  Concentration: Good  Attention Span: Good  Recall: Good  Fund of Knowledge: Good  Language: Good   Psychomotor Activity  Psychomotor Activity: Normal   Assets  Assets: Communication Skills; Desire for Improvement; Social Support   Sleep  Sleep: Poor  Number of hours:  9   Physical Exam: Physical Exam Constitutional:      General: She is not in acute distress.    Appearance: She is not diaphoretic.  HENT:     Head: Normocephalic.     Right Ear: External ear normal.     Left Ear: External ear normal.     Nose: No congestion.  Eyes:     General:        Right eye: No discharge.        Left eye: No discharge.  Pulmonary:     Effort: No respiratory distress.  Chest:     Chest wall: No tenderness.  Neurological:     Mental Status: She is alert and oriented to person, place, and time.  Psychiatric:        Attention and Perception: She perceives auditory hallucinations.        Mood and Affect: Mood is anxious.        Speech: Speech normal.        Behavior: Behavior is cooperative.        Thought Content: Thought content normal. Thought content is not paranoid or delusional. Thought content does not include homicidal or suicidal ideation. Thought content does not include homicidal or suicidal plan.        Cognition and Memory: Cognition and memory normal.    Review of Systems  Constitutional:  Negative for chills, diaphoresis  and fever.  HENT:  Negative for congestion.   Eyes:  Negative for discharge.  Respiratory:  Negative for cough, shortness of breath and wheezing.   Cardiovascular:  Negative for chest pain and palpitations.  Gastrointestinal:  Negative for diarrhea, nausea and vomiting.  Neurological:  Negative for tingling, seizures, weakness and headaches.  Psychiatric/Behavioral:  Negative for depression, substance abuse and suicidal ideas. The patient is nervous/anxious.    Blood pressure (!) 157/101, pulse (!) 103, temperature 98.6 F (37 C), temperature source Oral, resp. rate 20, SpO2 98 %. There is no height or weight on file to calculate BMI.  Musculoskeletal: Strength & Muscle Tone: within normal limits Gait & Station: normal Patient leans: N/A   [place] [facility] Discharge Disposition for Follow up and Recommendations: Based on my evaluation the patient does not appear to have an emergency medical condition and can be discharged with resources and follow up care in outpatient services for Medication Management  Recommend discharge home and follow-up with outpatient psychiatric services.  Patient does not meet inpatient psychiatric admission criteria or IVC criteria at this time.  There is no evidence of imminent risk of harm to self or others. Patient provided with [facility] outpatient walk in clinic resources and encouraged to follow up in am.   Order placed for Ativan 1 mg PO for 1 dose for anxiety during this encounter and follow up with all other medication refill requests with outpatient clinic. Patient verbalized her agreement and understanding.  Discussed methods to reduce the risk of self-injury or suicide attempts: Frequent conversations regarding unsafe thoughts. Remove all significant sharps. Remove all firearms. Remove all medications, including over-the-counter meds. Consider lockbox for medications and having a responsible person dispense medications until patient has strengthened coping skills.  Room checks for sharps or other harmful objects. Secure all chemical substances that can be ingested or inhaled.   Please refrain from using alcohol or illicit substances, as they can affect your mood and can cause depression, anxiety or other concerning symptoms.  Alcohol can increase the chance that a person will make reckless decisions, like attempting suicide, and can increase the lethality of a drug overdose.   Discussed crisis plan, calling 911, or going to the ED if condition changes or worsens.  Patient verbalized understanding.   Patient discharged home and condition at discharge is stable.  Randon Goldsmith, NP 12/04/2022, 1:58 AM

## 2022-12-04 NOTE — Progress Notes (Signed)
Psychiatric Initial Adult Assessment   Patient Identification: Nicole [name] MRN:  [id number] Date of Evaluation:  12/04/2022 Referral Source: Referred by [facility] Chief Complaint:   Chief Complaint  Patient presents with   Establish Care   Medication Refill   Visit Diagnosis:    ICD-10-CM   1. Schizophrenia, unspecified type ([place])  F20.9 QUEtiapine (SEROQUEL) 400 MG tablet    QUEtiapine (SEROQUEL) 50 MG tablet    DISCONTINUED: Oxcarbazepine (TRILEPTAL) 300 MG tablet    DISCONTINUED: QUEtiapine (SEROQUEL) 50 MG tablet    2. MDD (major depressive disorder), recurrent episode, moderate (HCC)  F33.1 sertraline (ZOLOFT) 50 MG tablet    sertraline (ZOLOFT) 50 MG tablet    3. Anxiety state  F41.1 sertraline (ZOLOFT) 50 MG tablet    sertraline (ZOLOFT) 50 MG tablet    4. Insomnia due to other mental disorder  F51.05 traZODone (DESYREL) 100 MG tablet   F99       History of Present Illness:    Nicole [name] is a 32 year old, African-American female with a past psychiatric history significant for schizophrenia (unspecified) and major depressive disorder who presents to [facility] to establish psychiatric care and for medication management.  Patient presents today requesting medication refills.  Per chart review, patient has been assessed several times at [facility] with her last assessment occurring on 12/04/2022.  Per chart review, patient has been on the following psychiatric medications:  Seroquel 400 mg 2 times daily Abilify Maintena 400 mg injection monthly Oxcarbazepine 300 mg twice daily Trazodone 100 mg at bedtime Sertraline 150 mg daily  Per chart review, patient's last Abilify Maintena injection was given on 11/22/2022 with her next injection being scheduled for 12/20/2022.  Today, patient presents stating that she is completely out of all of her medications.  She  reports that she has health insurance through [employer] and recently put in an order for her medications but they will arrive until 7-10 business days.  Patient reports that she is diagnosed with schizoaffective disorder (bipolar type) and was given the diagnosis at the age of 61.  Patient does not recall where she receives her diagnosis.  Patient denies knowing what medications she has been on in the past but was able to recall the medications she is currently on when addressed to her.  Patient states that she last remembers taking her medication a week ago.  Patient believes that her current medication regimen is helpful.  During the encounter, patient endorses depression and rates her depression as 7 out of 10 with 10 being most severe.  Patient reports that she experiences depression 2 days out of the week.  Patient's depressive episodes are characterized by the following symptoms:  self isolation, sadness, lack of motivation, and irritability.  Patient states that she often cannot deal with people during her depressive episodes.  Patient also endorses anxiety whenever around people.  Patient rates her anxiety at 10 out of 10.  Patient endorses multiple stressors in her life and states that she is trying to prove that she can be independent.  Patient states that she is also trying to remain in her transitional home.  In addition to depressive symptoms and anxiety, patient endorses hearing voices.  She denies that her auditory hallucinations are command type and reports that she often hears crying.  Patient endorses visual hallucinations stating that she saw 2 dead people standing at the door of her transitional home.  Patient  also endorses paranoia and feels like people are trying to get her.  Patient reports that she has been hospitalized multiple times in the past.  Patient reports that she has been admitted to [facility] in [place].  Patient denies a past history of suicide attempt. A PHQ-9 screen  was performed with the patient scoring a 20.  A GAD-7 screen was also performed the patient scoring a 21.  Patient is alert and oriented x 4, calm, cooperative, and fully engaged in conversation during the encounter.  Patient describes her mood as being all over the place.  Patient denies suicidal or homicidal ideation.  Patient endorses both auditory and visual hallucinations but does not appear to be responding to internal/external stimuli.  Patient endorses paranoia stating that she feels people are out to get her.  Patient endorses delusional thoughts.  Patient endorses poor sleep and receives on average 2 hours of sleep each night.  Patient endorses decreased appetite.  Patient denies alcohol consumption.  Patient endorses tobacco use and smokes on average 3 cigarettes/day.  Patient denies illicit drug use.  Associated Signs/Symptoms: Depression Symptoms:  depressed mood, anhedonia, insomnia, psychomotor agitation, psychomotor retardation, fatigue, feelings of worthlessness/guilt, difficulty concentrating, hopelessness, impaired memory, recurrent thoughts of death, suicidal thoughts without plan, suicidal thoughts with specific plan, anxiety, panic attacks, loss of energy/fatigue, disturbed sleep, decreased labido, decreased appetite, (Hypo) Manic Symptoms:  Delusions, Distractibility, Elevated Mood, Flight of Ideas, Community education officer, Grandiosity, Hallucinations, Impulsivity, Irritable Mood, Labiality of Mood, Sexually Inapproprite Behavior, Anxiety Symptoms:  Agoraphobia, Excessive Worry, Panic Symptoms, Social Anxiety, Psychotic Symptoms:  Delusions, Hallucinations: Auditory Visual PTSD Symptoms: Negative  Past Psychiatric History:  Schizophrenia (unspecified type) Major depressive disorder  Patient denies a past history of suicide Patient denies a past history of homicide  Patient reports that she has been hospitalized due to mental health multiple  times.  Patient states that she has done stints at [facility] and [place]  Previous Psychotropic Medications: Yes   Substance Abuse History in the last 12 months:  No.  Consequences of Substance Abuse: Patient reports that she used to abuse alcohol and marijuana Medical Consequences:  Patient denies Legal Consequences:  Patient denies Family Consequences:  Patient denies Blackouts:  Patient denies DT's: Patient denies Withdrawal Symptoms:   Patient reports that she used to abuse alcohol.  Patient states that she would experience headaches if she went without alcohol for long periods of time.  Past Medical History:  Past Medical History:  Diagnosis Date   Anxiety    Depression    Hypothyroidism 08/07/2022   Tobacco use disorder 08/07/2022    Past Surgical History:  Procedure Laterality Date   WISDOM TOOTH EXTRACTION Bilateral 2020    Family Psychiatric History:  Patient denies family history of psychiatric illness  Family history of suicide attempts: Patient reports that her mother attempted suicide Family history of homicide: Patient denies Family history of substance abuse: Patient reports that her brother used to abuse marijuana and alcohol.  Patient states that her mother used to abuse marijuana and alcohol.  Family History:  Family History  Problem Relation Age of Onset   Hypertension Father    Diabetes Father     Social History:   Social History   Socioeconomic History   Marital status: Single    Spouse name: Not on file   Number of children: Not on file   Years of education: Not on file   Highest education level: Not on file  Occupational History  Not on file  Tobacco Use   Smoking status: Every Day    Types: Cigarettes   Smokeless tobacco: Never  Vaping Use   Vaping Use: Some days  Substance and Sexual Activity   Alcohol use: Never   Drug use: Never   Sexual activity: Yes    Partners: Female    Birth control/protection: Implant  Other Topics  Concern   Not on file  Social History Narrative   Not on file   Social Determinants of Health   Financial Resource Strain: Not on file  Food Insecurity: Not on file  Transportation Needs: Not on file  Physical Activity: Not on file  Stress: Not on file  Social Connections: Not on file    Additional Social History:  Patient endorses social support and is currently living in a transitional home.  Patient endorses having one child.  Patient endorses housing.  Denies being employed.  Patient denies a past history of military experience.  Patient denies a past history of prison or jail time.  Patient states that the highest education she has completed was ninth grade.  Patient denies access to weapons.  Allergies:   Allergies  Allergen Reactions   Apple Juice Anaphylaxis   Other Anaphylaxis and Other (See Comments)    SALMON   Watermelon Flavor Anaphylaxis    Metabolic Disorder Labs: Lab Results  Component Value Date   HGBA1C 5.7 (H) 10/15/2022   MPG 117 10/15/2022   MPG 96.8 06/07/2022   No results found for: "PROLACTIN" Lab Results  Component Value Date   CHOL 181 07/25/2022   TRIG 118 07/25/2022   HDL 45 07/25/2022   CHOLHDL 4.0 07/25/2022   VLDL 24 07/25/2022   LDLCALC 112 (H) 07/25/2022   LDLCALC 88 06/07/2022   Lab Results  Component Value Date   TSH 0.793 09/05/2022    Therapeutic Level Labs: No results found for: "LITHIUM" No results found for: "CBMZ" No results found for: "VALPROATE"  Current Medications: Current Outpatient Medications  Medication Sig Dispense Refill   [START ON 12/20/2022] QUEtiapine (SEROQUEL) 400 MG tablet Take 1 tablet (400 mg total) by mouth 2 (two) times daily. 60 tablet 1   [START ON 12/18/2022] sertraline (ZOLOFT) 50 MG tablet Take 3 tablets (150 mg total) by mouth daily. 90 tablet 1   ABILIFY MAINTENA 400 MG PRSY prefilled syringe 400 mg every 28 (twenty-eight) days.     levothyroxine (SYNTHROID) 100 MCG tablet Take 1 tablet (100  mcg total) by mouth daily at 6 (six) AM. 30 tablet 0   metFORMIN (GLUCOPHAGE) 500 MG tablet Take 1 tablet (500 mg total) by mouth daily with breakfast. 30 tablet 0   nicotine (NICODERM CQ - DOSED IN MG/24 HOURS) 14 mg/24hr patch Place 1 patch (14 mg total) onto the skin daily as needed (prn for tobacco cessation and nicotine cravings). 28 patch 0   Oxcarbazepine (TRILEPTAL) 300 MG tablet TAKE 1 TABLET(300 MG) BY MOUTH TWICE DAILY 180 tablet 1   pantoprazole (PROTONIX) 40 MG tablet Take 1 tablet (40 mg total) by mouth daily. 30 tablet 0   QUEtiapine (SEROQUEL) 50 MG tablet Take 1 tablet (50 mg total) by mouth 2 (two) times daily for 4 days, THEN 2 tablets (100 mg total) 2 (two) times daily for 4 days, THEN 4 tablets (200 mg total) 2 (two) times daily for 4 days, THEN 6 tablets (300 mg total) 2 (two) times daily for 4 days. 104 tablet 0   sertraline (ZOLOFT) 50 MG tablet  Take 1 tablet (50 mg total) by mouth daily for 4 days, THEN 2 tablets (100 mg total) daily for 4 days, THEN 3 tablets (150 mg total) daily for 6 days. 30 tablet 0   traZODone (DESYREL) 100 MG tablet Take 1 tablet (100 mg total) by mouth at bedtime. 30 tablet 1   valACYclovir (VALTREX) 500 MG tablet Take 1 tablet (500 mg total) by mouth daily. 30 tablet 0   No current facility-administered medications for this visit.    Musculoskeletal: Strength & Muscle Tone: within normal limits Gait & Station: normal Patient leans: N/A  Psychiatric Specialty Exam: Review of Systems  Psychiatric/Behavioral:  Positive for hallucinations and sleep disturbance. Negative for decreased concentration, dysphoric mood, self-injury and suicidal ideas. The patient is nervous/anxious. The patient is not hyperactive.     Blood pressure (!) 122/90, pulse 94, temperature 99.1 F (37.3 C), temperature source Oral, height '5\' 1"'$  (1.549 m), weight 225 lb (102.1 kg).Body mass index is 42.51 kg/m.  General Appearance: Casual  Eye Contact:  Good  Speech:  Clear  and Coherent and Normal Rate  Volume:  Normal  Mood:  Anxious and Depressed  Affect:  Congruent  Thought Process:  Coherent, Goal Directed, and Descriptions of Associations: Intact  Orientation:  Full (Time, Place, and Person)  Thought Content:  WDL, Hallucinations: Auditory Visual, and Paranoid Ideation  Suicidal Thoughts:  No  Homicidal Thoughts:  No  Memory:  Immediate;   Fair Recent;   Poor Remote;   Poor  Judgement:  Fair  Insight:  Shallow  Psychomotor Activity:  Restlessness  Concentration:  Concentration: Good and Attention Span: Good  Recall:  [place] of Knowledge:Fair  Language: Good  Akathisia:  No  Handed:  Right  AIMS (if indicated):  not done  Assets:  Communication Skills Desire for Improvement Financial Resources/Insurance Housing Social Support  ADL's:  Intact  Cognition: Impaired,  Mild  Sleep:  Poor   Screenings: GAD-7    Physiological scientist Office Visit from 12/04/2022 in [facility] Office Visit from 12/17/2021 in [facility] for [place] [facility] [place] Visit from 12/04/2022 in [facility] ED from 10/07/2022 in [facility] ED from 06/07/2022 in [facility] Office Visit from 12/17/2021 in [facility] for [place] at [facility] Total Score 4 0 1 2  PHQ-9 Total Score 20 -- 2 [place] Office Visit from 12/04/2022 in [facility] Most recent reading at 12/04/2022  9:29 AM ED from 10/07/2022 in [facility] Most recent reading at 11/22/2022  8:48 AM ED from 10/07/2022 in [facility] Emergency Department at [facility] Most recent reading at 10/07/2022 12:18 PM  C-SSRS RISK CATEGORY Low Risk No Risk No Risk       Assessment and Plan:   Nicole [name] is a 32 year old,  African-American female with a past psychiatric history significant for schizophrenia (unspecified) and major depressive disorder who presents to [facility] to establish psychiatric care and for medication management.  Patient presents today requesting to be placed back on her current medication regimen.  Per chart review, patient is currently being managed on the following psychiatric medications:  Seroquel 400 mg 2 times daily Abilify Maintena 400 mg injection monthly Oxcarbazepine  300 mg twice daily Trazodone 100 mg at bedtime Sertraline 150 mg daily  Patient reports that she last took her medications last week but recently put in a refill request for her medications from her [facility].  Patient is currently struggling with depression, anxiety, and psychotic symptoms characterized by paranoia and visual/auditory hallucinations.  Due to patient being without her medications for a week, provider to start patient back on her medications and taper her back to her therapeutic dosages.  Patient to be placed back on trazodone 100 mg at bedtime and oxcarbazepine 300 mg twice daily.  Patient to be placed on sertraline 50 mg for 4 days, followed by 100 mg for 4 days, then continue taking sertraline 150 mg daily.    Patient to be placed back on Seroquel: Take 1 tablet (50 mg total) by mouth 2 (two) times daily for 4 days, THEN 2 tablets (100 mg total) 2 (two) times daily for 4 days, THEN 4 tablets (200 mg total) 2 (two) times daily for 4 days, THEN 6 tablets (300 mg total) 2 (two) times daily for 4 days.  Patient to continue taking Seroquel 400 mg 2 times daily for the management of her schizophrenia.  Patient was agreeable to recommendations.  Patient's medications to be e-prescribed to pharmacy of choice.  Collaboration of Care: Medication Management AEB provider managing patient's psychiatric medications, Psychiatrist AEB patient being followed by mental health  provider, and Referral or follow-up with counselor/therapist AEB patient being seen by a licensed clinical social worker at this facility  Patient/Guardian was advised Release of Information must be obtained prior to any record release in order to collaborate their care with an outside provider. Patient/Guardian was advised if they have not already done so to contact the registration department to sign all necessary forms in order for [place] to release information regarding their care.   Consent: Patient/Guardian gives verbal consent for treatment and assignment of benefits for services provided during this visit. Patient/Guardian expressed understanding and agreed to proceed.   1. Schizophrenia, unspecified type ([place])  - QUEtiapine (SEROQUEL) 400 MG tablet; Take 1 tablet (400 mg total) by mouth 2 (two) times daily.  Dispense: 60 tablet; Refill: 1 - QUEtiapine (SEROQUEL) 50 MG tablet; Take 1 tablet (50 mg total) by mouth 2 (two) times daily for 4 days, THEN 2 tablets (100 mg total) 2 (two) times daily for 4 days, THEN 4 tablets (200 mg total) 2 (two) times daily for 4 days, THEN 6 tablets (300 mg total) 2 (two) times daily for 4 days.  Dispense: 104 tablet; Refill: 0  2. MDD (major depressive disorder), recurrent episode, moderate (HCC)  - sertraline (ZOLOFT) 50 MG tablet; Take 1 tablet (50 mg total) by mouth daily for 4 days, THEN 2 tablets (100 mg total) daily for 4 days, THEN 3 tablets (150 mg total) daily for 6 days.  Dispense: 30 tablet; Refill: 0 - sertraline (ZOLOFT) 50 MG tablet; Take 3 tablets (150 mg total) by mouth daily.  Dispense: 90 tablet; Refill: 1  3. Anxiety state  - sertraline (ZOLOFT) 50 MG tablet; Take 1 tablet (50 mg total) by mouth daily for 4 days, THEN 2 tablets (100 mg total) daily for 4 days, THEN 3 tablets (150 mg total) daily for 6 days.  Dispense: 30 tablet; Refill: 0 - sertraline (ZOLOFT) 50 MG tablet; Take 3 tablets (150 mg total) by mouth daily.  Dispense: 90 tablet;  Refill: 1  4. Insomnia due to other mental disorder  -  traZODone (DESYREL) 100 MG tablet; Take 1 tablet (100 mg total) by mouth at bedtime.  Dispense: 30 tablet; Refill: 1  Patient to follow-up in 6 weeks Provider spent a total of 55 minutes with the patient/reviewing patient's chart  Malachy Mood, PA 2/21/20245:52 PM

## 2022-12-04 NOTE — Discharge Instructions (Addendum)

## 2022-12-04 NOTE — ED Triage Notes (Signed)
Pt presents to [facility] voluntarily, accompanied by [employer] with complaint of auditory/olfactory hallucinations. Pt is very shaky and stated " I just can't take it anymore". Pt report having difficulty resting tonight. Pt states she was hearing crying and smelling scents that were not there. Pt reports being out of psychotropic medications and tried to have them refilled on last Friday, but have not received them at this time. Pt does not remember the last time she had medication. Pt denies SI,HI and substance/alchohol use.
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
## 2022-12-05 ENCOUNTER — Encounter (HOSPITAL_COMMUNITY): Payer: Self-pay | Admitting: Physician Assistant

## 2022-12-13 ENCOUNTER — Encounter (HOSPITAL_COMMUNITY): Payer: Self-pay

## 2022-12-13 ENCOUNTER — Ambulatory Visit (INDEPENDENT_AMBULATORY_CARE_PROVIDER_SITE_OTHER): Payer: Medicare HMO | Admitting: Mental Health

## 2022-12-13 ENCOUNTER — Ambulatory Visit (HOSPITAL_COMMUNITY): Payer: Medicare HMO | Admitting: Physician Assistant

## 2022-12-13 DIAGNOSIS — F209 Schizophrenia, unspecified: Secondary | ICD-10-CM | POA: Diagnosis not present

## 2022-12-13 DIAGNOSIS — F331 Major depressive disorder, recurrent, moderate: Secondary | ICD-10-CM

## 2022-12-13 NOTE — Progress Notes (Signed)
THERAPIST PROGRESS NOTE Virtual Visit via Video Note  I connected with Nicole [name] on 12/13/22 at  8:00 AM EST by a video enabled telemedicine application and verified that I am speaking with the correct person using two identifiers.  Location: Patient: home address on file Provider: office    I discussed the limitations of evaluation and management by telemedicine and the availability of in person appointments. The patient expressed understanding and agreed to proceed.  I discussed the assessment and treatment plan with the patient. The patient was provided an opportunity to ask questions and all were answered. The patient agreed with the plan and demonstrated an understanding of the instructions.   The patient was advised to call back or seek an in-person evaluation if the symptoms worsen or if the condition fails to improve as anticipated.  I provided 45 minutes of non-face-to-face time during this encounter.   Marion Downer, [facility]   Session Time: 8:06 am (45 minutes)  Participation Level: Active  Behavioral Response: CasualAlertAdequate  Type of Therapy: Individual Therapy  Treatment Goals addressed: STG: [name] will maintain mental health stability AEB daily medication compliance and development of x 3 coping skills for sxs within the next 90 days.   STG: [name] will increase level of functioning AEB development of effective decision making skills within the next 90 days.   ProgressTowards Goals: Initial  Interventions: Supportive MI  Summary: Nicole [name] is a 32 y.o. female who presents with dx of schizophrenia and major depressive disorder. Presents alert and oriented; mood and affect adequate. Speech clear and coherent at normal rate and tone. Engaged and receptive to interventions. Shares hx of being hospitalized but unable to report reasons for admissions; chart indicating AVH and depressive sxs with SI. Shares to have presented to [facility] at one time  due to not wanting to be in most recent group home and does not feel as if staffed liked her. States to currently be living in transitional housing and reports to like this housing placement better stating to have more freedom and able to cook her own food. Shares history of difficulty adhering to medications with others noting for her to not do well off medications. Shares to have x 1 daughter who is the custody of her parents who live in [place] as well as father being her legal guardian. Able to explore with therapist areas of progress in which she would like to see and development of treatment goals. Shares plans to work to obtain GED with planning to sign up for classes and shares would like to be able to work part time one day. Shares to have x 1 friendship in the home she is getting to know and denies other friendships at this time sharing hx of difficulty with friendships; difficulty with boundaries and engaging with supports of negative influence. Denies safety concerns; Denies SI/HI/AVH.  Initial txt plan developed;sxs reported to be stable at this time   Suicidal/Homicidal: Nowithout intent/plan  Therapist Response: Therapist engaged [name] in [place] session. Assessed for confidentiality of session and current location. Educated on informed consent and bounds of confidentiality. Reviewed assessment information. Open ended questioning exploring hx of treatment; sxs management and current level of functioning. Explored desire to engage in services and intrinsic motivation for engagement in therapy. Explores barriers of managing sxs in the past. Explored areas of progress and areas in need of improvement. Explored treatment goals and what progress would look like for pt. Reviewed session, provided follow up appointment. Assessed for  safety.   Plan: Return again in  x 4 weeks.  Diagnosis: Schizophrenia, unspecified type ([place])  MDD (major depressive disorder), recurrent episode, moderate  ([place])  Collaboration of Care: Other None  Patient/Guardian was advised Release of Information must be obtained prior to any record release in order to collaborate their care with an outside provider. Patient/Guardian was advised if they have not already done so to contact the registration department to sign all necessary forms in order for [place] to release information regarding their care.   Consent: Patient/Guardian gives verbal consent for treatment and assignment of benefits for services provided during this visit. Patient/Guardian expressed understanding and agreed to proceed.   [name] [place], [facility] 12/13/2022

## 2022-12-14 IMAGING — CR DG CHEST 2V
2 series · 2 of 2 positions shown · non-contrast
Comparison: None.

CLINICAL DATA: Cough for 2 days.

EXAM:
CHEST - 2 VIEW

[w chest pa]
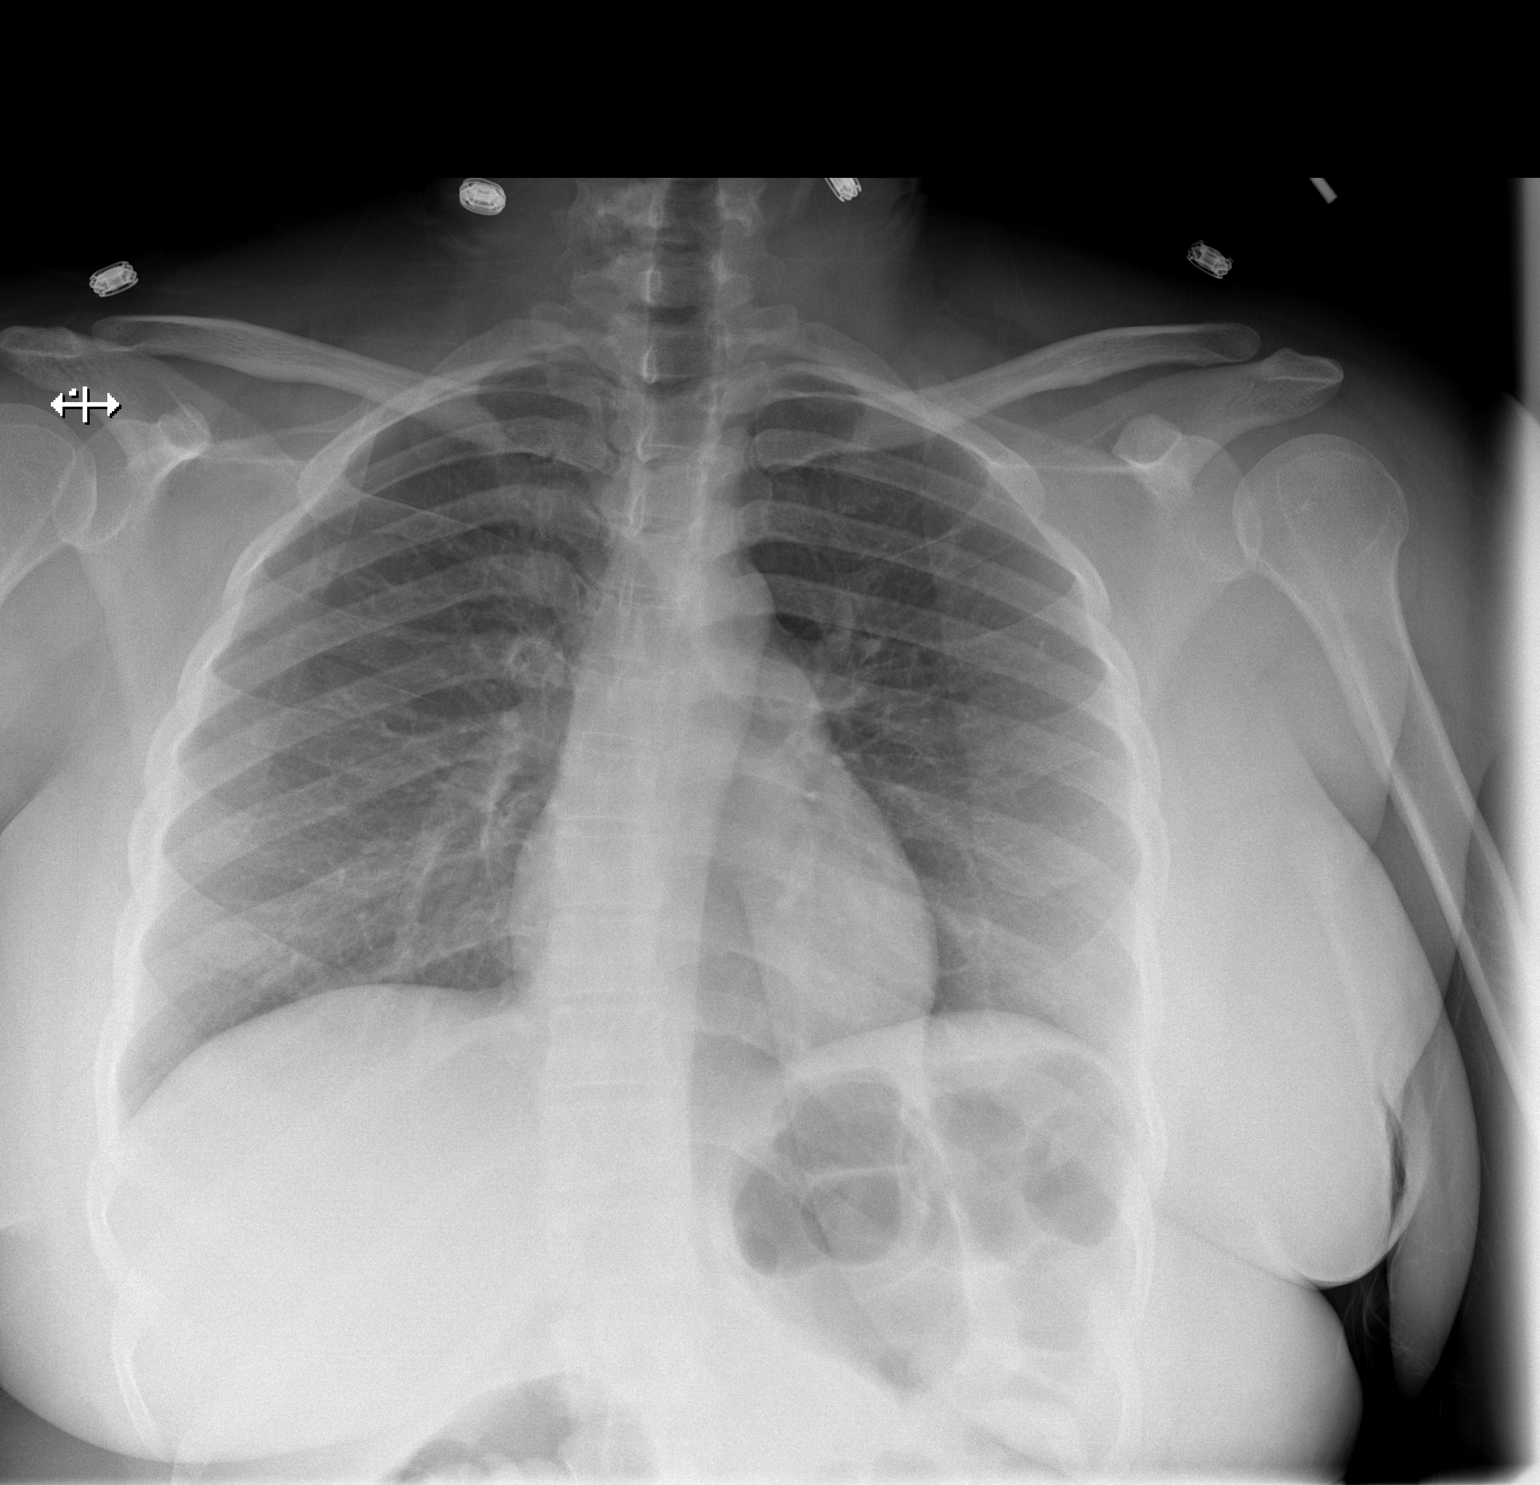

[w chest lat]
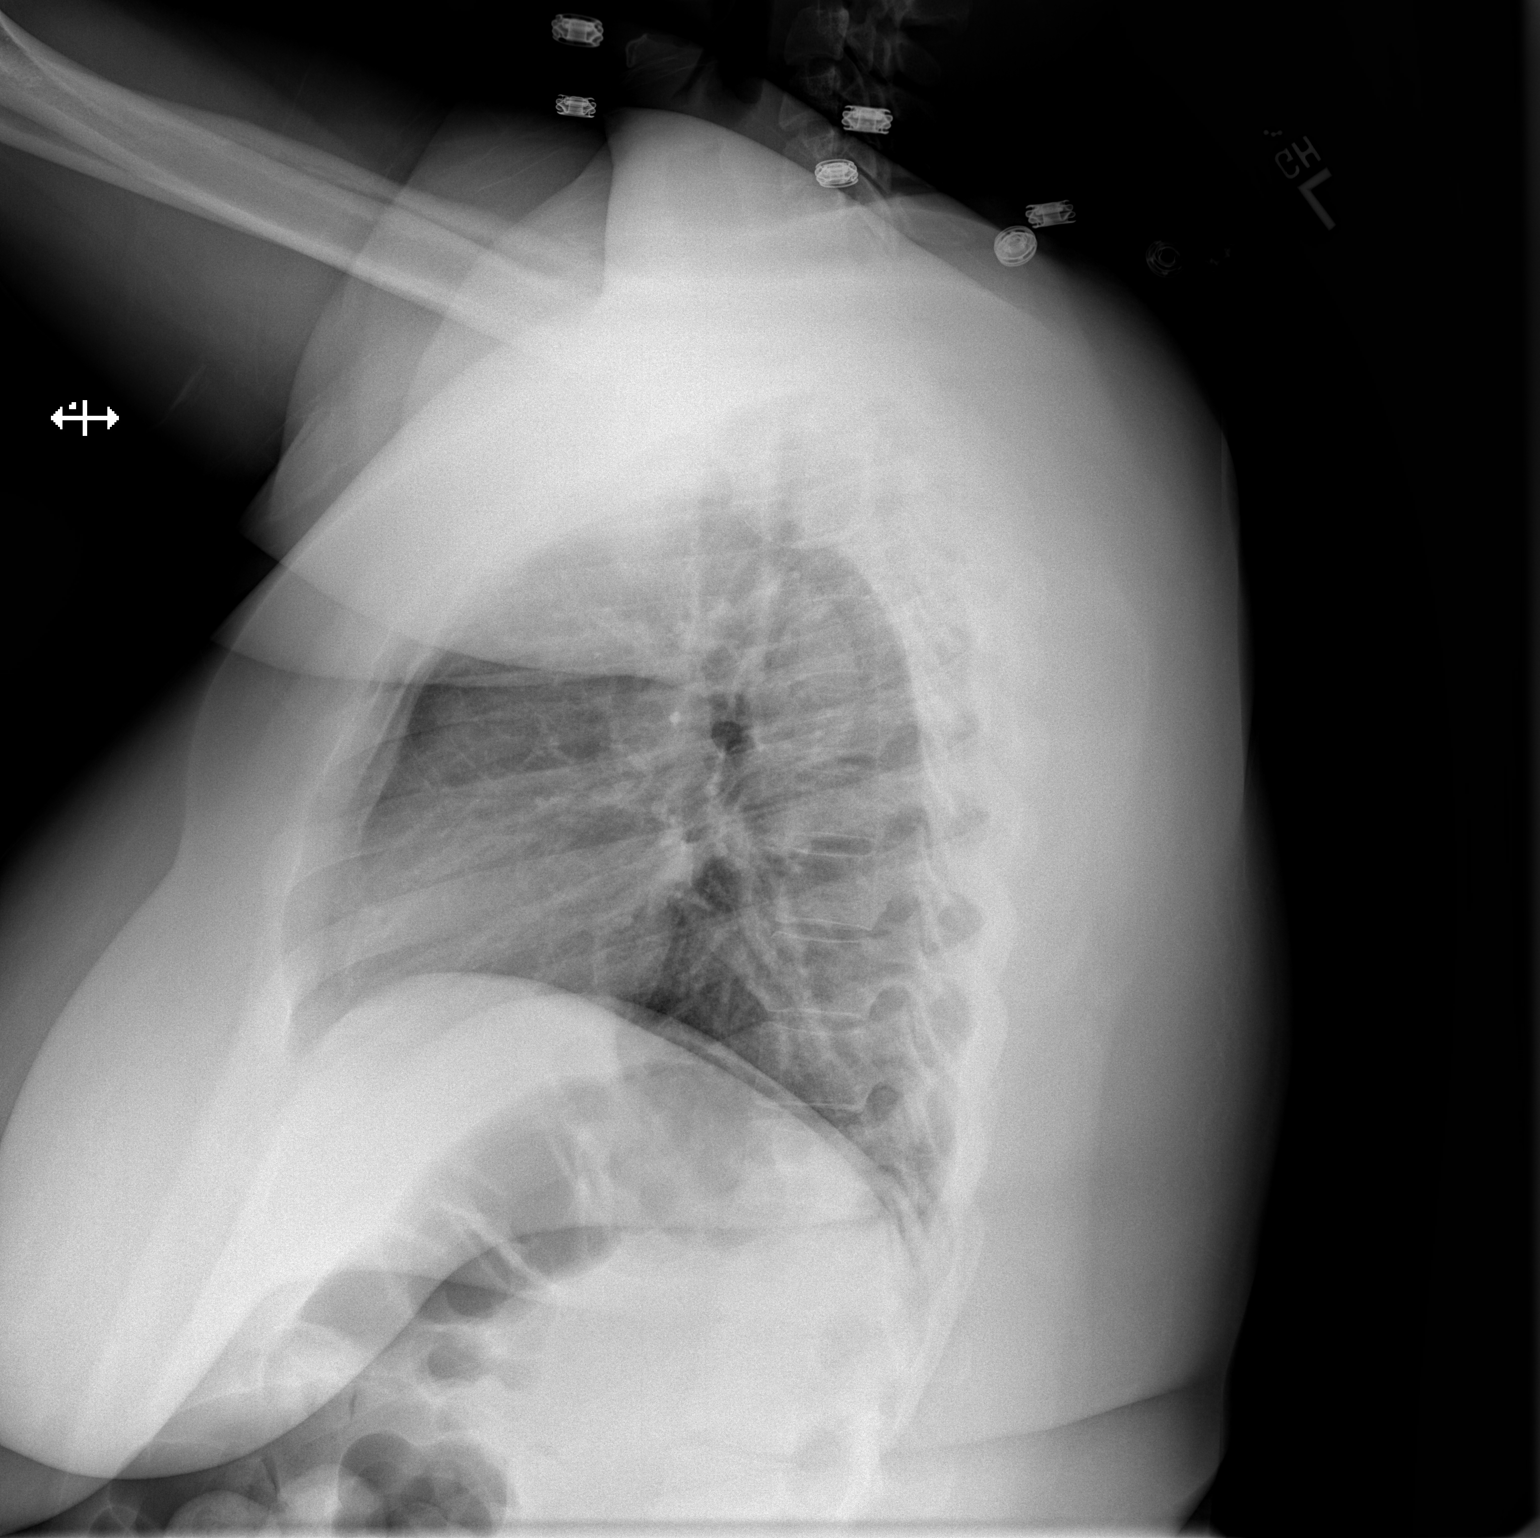

[2 of 2 positions shown; findings below may reference images not displayed]

FINDINGS: The cardiomediastinal contours are normal. The lungs are clear.
Pulmonary vasculature is normal. No consolidation, pleural effusion,
or pneumothorax. Mild broad-based dextroscoliotic curvature of the
spine. No acute osseous abnormalities are seen.
IMPRESSION: No acute chest findings.

## 2023-01-10 ENCOUNTER — Ambulatory Visit (INDEPENDENT_AMBULATORY_CARE_PROVIDER_SITE_OTHER): Payer: Medicare HMO | Admitting: Mental Health

## 2023-01-10 DIAGNOSIS — F209 Schizophrenia, unspecified: Secondary | ICD-10-CM

## 2023-01-10 DIAGNOSIS — F331 Major depressive disorder, recurrent, moderate: Secondary | ICD-10-CM

## 2023-01-10 NOTE — Progress Notes (Signed)
THERAPIST PROGRESS NOTE Virtual Visit via Video Note  I connected with Nicole [name] on 01/10/23 at  8:00 AM EDT by [name] video enabled telemedicine application and verified that I am speaking with the correct person using two identifiers.  Location: Patient: home address on fine Provider: office    I discussed the limitations of evaluation and management by telemedicine and the availability of in person appointments. The patient expressed understanding and agreed to proceed.  I discussed the assessment and treatment plan with the patient. The patient was provided an opportunity to ask questions and all were answered. The patient agreed with the plan and demonstrated an understanding of the instructions.   The patient was advised to call back or seek an in-person evaluation if the symptoms worsen or if the condition fails to improve as anticipated.  I provided 30 minutes of non-face-to-face time during this encounter.   Nicole [name], [facility]   Session Time: 8:05 am ( 30 minutes)  Participation Level: Active  Behavioral Response: BizarreAlertWNL  Type of Therapy: Individual Therapy  Treatment Goals addressed: STG: Nicole [name] will maintain mental health stability AEB daily medication compliance and development of x 3 coping skills for sxs within the next 90 days.    STG: Nicole [name] will increase level of functioning AEB development of effective decision making skills within the next 90 days.   ProgressTowards Goals: Initial  Interventions: CBT and Supportive  Summary: Nicole [name] is [name] 32 y.o. female who presents with dx of schizophrenia and major depressive disorder. Presents alert and oriented; mood and affect adequate. Speech clear and coherent at normal rate and tone. Engaged and receptive to interventions. Dressed in large black winter coat with hood, indoors; denies being cold and reports for comfort. Reports to be doing well and adjusting to new placement at transitional  home. Denies sxs of depression or anxiety; denies AVH or delusions. Reports to be taking medications but notes to be taking all at night despite some being take twice [name] day. Agrees to review this with her prescriber. Engaged with therapist to explore use of coping skills for moods and reports sleep to be main coping skills. Able to explore benefit of having added coping skills. Notes could go for walk or call [name] friend. Engaged with review of PLEASE skill and explores areas in need of improvement with management of sleep, nutrition and exercise. Denies safety concerns. Sxs stable at this time with working to make progress with goals.    Suicidal/Homicidal: Nowithout intent/plan  Therapist Response: Therapist engaged Financial [employer] in [place] session. Assessed for confidentiality of session and current location. Engaged Nicole [name] in exploring ability to maintain at current placement and explored for any concerns she has with placement. Assessed for medication management adherence with being in charge of her own compliance. Explored coping skills for management of moods and distress and engaged in education of benefit of variety of coping skills with sleep at times [name] form of avoidance. Engaged in [place] skill and reviewed. Explored with Nicole [name] behavioral changes that could improve to support in increase care of self and ongoing MH stability. Reviewed session provided follow up and assessed for safety.   Plan: Return again in x 4 weeks.  Diagnosis: Schizophrenia, unspecified type ([name])  MDD (major depressive disorder), recurrent episode, moderate ([name])  Collaboration of Care: Other None  Patient/Guardian was advised Release of Information must be obtained prior to any record release in order to collaborate their care with an outside provider. Patient/Guardian was advised if they  have not already done so to contact the registration department to sign all necessary forms in order for [place] to release information  regarding their care.   Consent: Patient/Guardian gives verbal consent for treatment and assignment of benefits for services provided during this visit. Patient/Guardian expressed understanding and agreed to proceed.   Nicole [name] [place], [facility] 01/10/2023

## 2023-01-15 ENCOUNTER — Encounter (HOSPITAL_COMMUNITY): Payer: Medicare HMO | Admitting: Physician Assistant

## 2023-01-15 DIAGNOSIS — F209 Schizophrenia, unspecified: Secondary | ICD-10-CM | POA: Diagnosis not present

## 2023-01-15 DIAGNOSIS — A6000 Herpesviral infection of urogenital system, unspecified: Secondary | ICD-10-CM | POA: Diagnosis not present

## 2023-01-15 DIAGNOSIS — K219 Gastro-esophageal reflux disease without esophagitis: Secondary | ICD-10-CM | POA: Diagnosis not present

## 2023-01-15 DIAGNOSIS — Z1321 Encounter for screening for nutritional disorder: Secondary | ICD-10-CM | POA: Diagnosis not present

## 2023-01-15 DIAGNOSIS — Z1159 Encounter for screening for other viral diseases: Secondary | ICD-10-CM | POA: Diagnosis not present

## 2023-01-15 DIAGNOSIS — Z79899 Other long term (current) drug therapy: Secondary | ICD-10-CM | POA: Diagnosis not present

## 2023-01-15 DIAGNOSIS — G4700 Insomnia, unspecified: Secondary | ICD-10-CM | POA: Diagnosis not present

## 2023-01-15 DIAGNOSIS — F331 Major depressive disorder, recurrent, moderate: Secondary | ICD-10-CM | POA: Diagnosis not present

## 2023-01-21 ENCOUNTER — Telehealth (HOSPITAL_COMMUNITY): Payer: Self-pay | Admitting: *Deleted

## 2023-01-21 NOTE — Telephone Encounter (Signed)
Patient called stating she is needing her Abilify injection but it's not at her pharmacy to pick up. Asking if it can be sent in and she come for delivery of medication. Last given on 11/22/22. No future appointments with MD.

## 2023-01-22 ENCOUNTER — Other Ambulatory Visit (HOSPITAL_COMMUNITY): Payer: Self-pay | Admitting: Physician Assistant

## 2023-01-29 ENCOUNTER — Ambulatory Visit (HOSPITAL_COMMUNITY)
Admission: EM | Admit: 2023-01-29 | Discharge: 2023-01-30 | Disposition: A | Discharge status: Home / Self Care | Payer: Medicare HMO | Attending: Nurse Practitioner | Admitting: Nurse Practitioner

## 2023-01-29 DIAGNOSIS — Z1152 Encounter for screening for COVID-19: Secondary | ICD-10-CM | POA: Insufficient documentation

## 2023-01-29 DIAGNOSIS — Z79899 Other long term (current) drug therapy: Secondary | ICD-10-CM | POA: Diagnosis not present

## 2023-01-29 DIAGNOSIS — F209 Schizophrenia, unspecified: Secondary | ICD-10-CM | POA: Diagnosis not present

## 2023-01-29 DIAGNOSIS — Z5901 Sheltered homelessness: Secondary | ICD-10-CM | POA: Diagnosis not present

## 2023-01-29 DIAGNOSIS — R45851 Suicidal ideations: Secondary | ICD-10-CM | POA: Insufficient documentation

## 2023-01-29 DIAGNOSIS — F32A Depression, unspecified: Secondary | ICD-10-CM | POA: Diagnosis not present

## 2023-01-29 MED ORDER — HYDROXYZINE HCL 25 MG PO TABS
25.0000 mg | ORAL_TABLET | Freq: Three times a day (TID) | ORAL | Status: DC | PRN
  Administered 2023-01-30: 25 mg via ORAL
  Filled 2023-01-29: qty 1

## 2023-01-29 MED ORDER — ALUM & MAG HYDROXIDE-SIMETH 200-200-20 MG/5ML PO SUSP: 30.0000 mL | ORAL | Status: DC | PRN

## 2023-01-29 MED ORDER — MAGNESIUM HYDROXIDE 400 MG/5ML PO SUSP: 30.0000 mL | Freq: Every day | ORAL | Status: DC | PRN

## 2023-01-29 MED ORDER — QUETIAPINE FUMARATE 400 MG PO TABS: 400.0000 mg | ORAL_TABLET | Freq: Two times a day (BID) | ORAL | Status: DC

## 2023-01-29 MED ORDER — METFORMIN HCL 500 MG PO TABS
500.0000 mg | ORAL_TABLET | Freq: Every day | ORAL | Status: DC
  Administered 2023-01-30: 500 mg via ORAL
  Filled 2023-01-29: qty 1

## 2023-01-29 MED ORDER — ACETAMINOPHEN 325 MG PO TABS: 650.0000 mg | ORAL_TABLET | Freq: Four times a day (QID) | ORAL | Status: DC | PRN

## 2023-01-29 MED ORDER — SERTRALINE HCL 50 MG PO TABS
150.0000 mg | ORAL_TABLET | Freq: Every day | ORAL | Status: DC
  Administered 2023-01-30: 150 mg via ORAL
  Filled 2023-01-29: qty 1

## 2023-01-29 MED ORDER — LEVOTHYROXINE SODIUM 100 MCG PO TABS
100.0000 ug | ORAL_TABLET | Freq: Every day | ORAL | Status: DC
  Administered 2023-01-30: 100 ug via ORAL
  Filled 2023-01-29: qty 1

## 2023-01-29 MED ORDER — TRAZODONE HCL 50 MG PO TABS
50.0000 mg | ORAL_TABLET | Freq: Every evening | ORAL | Status: DC | PRN
  Administered 2023-01-30: 50 mg via ORAL
  Filled 2023-01-29: qty 1

## 2023-01-29 NOTE — ED Provider Notes (Incomplete)
[facility] Continuous Assessment Admission H&P  Date: 01/30/23 Patient Name: Nicole [name] MRN: [id number] Chief Complaint: " I feel like overdosing on my sleeping pills".  Diagnoses:  Final diagnoses:  Suicidal ideation    HPI:   Total Time spent with patient: 20 minutes  Musculoskeletal  Strength & Muscle Tone: within normal limits Gait & Station: normal Patient leans: N/A  Psychiatric Specialty Exam  Presentation General Appearance:  Well Groomed  Eye Contact: Good  Speech: Clear and Coherent  Speech Volume: Normal  Handedness: Right   Mood and Affect  Mood: Anxious; Depressed  Affect: Congruent   Thought Process  Thought Processes: Coherent  Descriptions of Associations:Intact  Orientation:Full (Time, Place and Person)  Thought Content:WDL  Diagnosis of Schizophrenia or Schizoaffective disorder in past: Yes  Duration of Psychotic Symptoms: Greater than six months  Hallucinations:Hallucinations: Auditory Description of Auditory Hallucinations: Patient reports hearing voices  telling her nobody cares and she should just go and die Description of Visual Hallucinations: denies  Ideas of Reference:None  Suicidal Thoughts:Suicidal Thoughts: Yes, Active SI Active Intent and/or Plan: With Plan  Homicidal Thoughts:Homicidal Thoughts: No   Sensorium  Memory: Immediate Fair  Judgment: Intact  Insight: Poor   Executive Functions  Concentration: Good  Attention Span: Good  Recall: Fair  Fund of Knowledge: Good  Language: Good   Psychomotor Activity  Psychomotor Activity: Psychomotor Activity: Normal   Assets  Assets: Communication Skills; Desire for Improvement; Social Support   Sleep  Sleep: Sleep: Poor   Nutritional Assessment (For OBS and FBC admissions only) Has the patient had a weight loss or gain of 10 pounds or more in the last 3 months?: No Has the patient had a decrease in food intake/or  appetite?: No Does the patient have dental problems?: No Does the patient have eating habits or behaviors that may be indicators of an eating disorder including binging or inducing vomiting?: No Has the patient recently lost weight without trying?: 0 Has the patient been eating poorly because of a decreased appetite?: 0 Malnutrition Screening Tool Score: 0    Physical Exam Constitutional:      General: She is not in acute distress.    Appearance: She is not diaphoretic.  HENT:     Head: Normocephalic.     Right Ear: External ear normal.     Left Ear: External ear normal.     Nose: No congestion.  Eyes:     General:        Right eye: No discharge.        Left eye: No discharge.  Cardiovascular:     Rate and Rhythm: Normal rate.  Pulmonary:     Effort: No respiratory distress.  Chest:     Chest wall: No tenderness.  Neurological:     Mental Status: She is alert and oriented to person, place, and time.  Psychiatric:        Attention and Perception: She perceives auditory hallucinations.        Mood and Affect: Mood is anxious and depressed.        Speech: Speech normal.        Behavior: Behavior is cooperative.        Thought Content: Thought content is not paranoid or delusional. Thought content includes suicidal ideation. Thought content does not include homicidal ideation. Thought content includes suicidal plan. Thought content does not include homicidal plan.    Review of Systems  Constitutional:  Negative for chills, diaphoresis and fever.  HENT:  Negative for congestion.   Eyes:  Negative for discharge.  Respiratory:  Negative for cough, shortness of breath and wheezing.   Cardiovascular:  Negative for chest pain and palpitations.  Gastrointestinal:  Negative for diarrhea, nausea and vomiting.  Neurological:  Negative for tingling, seizures, loss of consciousness, weakness and headaches.  Psychiatric/Behavioral:  Positive for depression and suicidal ideas. The patient  is nervous/anxious.     Blood pressure (!) 128/97, pulse 67, temperature 98.3 F (36.8 C), resp. rate 18, SpO2 100 %. There is no height or weight on file to calculate BMI.  Past Psychiatric History: See H & P   Is the patient at risk to self? Yes  Has the patient been a risk to self in the past 6 months? Yes .    Has the patient been a risk to self within the distant past? Yes   Is the patient a risk to others? No   Has the patient been a risk to others in the past 6 months? No   Has the patient been a risk to others within the distant past? No   Past Medical History: See Chart  Family History: N/A  Social History: N/A  Last Labs:  Admission on 10/07/2022, Discharged on 11/23/2022  Component Date Value Ref Range Status  . Hgb A1c MFr Bld 10/15/2022 5.7 (H)  4.8 - 5.6 % Final   Comment: (NOTE)         Prediabetes: 5.7 - 6.4         Diabetes: >6.4         Glycemic control for adults with diabetes: <7.0   . Mean Plasma Glucose 10/15/2022 117  mg/dL Final   Comment: (NOTE) Performed At: [facility] [address] [place], [place] [id number] Jolene Schimke MD NW:[id number]   . Color, Urine 11/13/2022 YELLOW  YELLOW Final  . APPearance 11/13/2022 CLOUDY (A)  CLEAR Final  . Specific Gravity, Urine 11/13/2022 1.023  1.005 - 1.030 Final  . pH 11/13/2022 6.0  5.0 - 8.0 Final  . Glucose, UA 11/13/2022 NEGATIVE  NEGATIVE mg/dL Final  . Hgb urine dipstick 11/13/2022 NEGATIVE  NEGATIVE Final  . Bilirubin Urine 11/13/2022 NEGATIVE  NEGATIVE Final  . Ketones, ur 11/13/2022 NEGATIVE  NEGATIVE mg/dL Final  . Protein, ur 57/84/6962 NEGATIVE  NEGATIVE mg/dL Final  . Nitrite 95/28/4132 NEGATIVE  NEGATIVE Final  . Glori Luis 11/13/2022 NEGATIVE  NEGATIVE Final   Performed at [facility], [address], [place], [place] [address]  Office Visit on 10/21/2022  Component Date Value Ref Range Status  . SARSCOV2ONAVIRUS 2 AG 11/01/2022 NEGATIVE  NEGATIVE Final   Comment:  (NOTE) SARS-CoV-2 antigen NOT DETECTED.   Negative results are presumptive.  Negative results do not preclude SARS-CoV-2 infection and should not be used as the sole basis for treatment or other patient management decisions, including infection  control decisions, particularly in the presence of clinical signs and  symptoms consistent with COVID-19, or in those who have been in contact with the virus.  Negative results must be combined with clinical observations, patient history, and epidemiological information. The expected result is Negative.  Fact Sheet for Patients: https://www.jennings-kim.com/  Fact Sheet for Healthcare Providers: https://alexander-rogers.biz/  This test is not yet approved or cleared by the [place] FDA and  has been authorized for detection and/or diagnosis of SARS-CoV-2 by FDA under an Emergency Use Authorization (EUA).  This EUA will remain in effect (meaning this test can be used) for  the duration of  the COV                          ID-19 declaration under Section 564(b)(1) of the Act, 21 U.S.C. section 360bbb-3(b)(1), unless the authorization is terminated or revoked sooner.    Admission on 10/07/2022, Discharged on 10/07/2022  Component Date Value Ref Range Status  . Color, Urine 10/07/2022 YELLOW  YELLOW Final  . APPearance 10/07/2022 HAZY (A)  CLEAR Final  . Specific Gravity, Urine 10/07/2022 1.011  1.005 - 1.030 Final  . pH 10/07/2022 7.0  5.0 - 8.0 Final  . Glucose, UA 10/07/2022 NEGATIVE  NEGATIVE mg/dL Final  . Hgb urine dipstick 10/07/2022 SMALL (A)  NEGATIVE Final  . Bilirubin Urine 10/07/2022 NEGATIVE  NEGATIVE Final  . Ketones, ur 10/07/2022 NEGATIVE  NEGATIVE mg/dL Final  . Protein, ur 16/07/9603 NEGATIVE  NEGATIVE mg/dL Final  . Nitrite 54/06/8118 NEGATIVE  NEGATIVE Final  . Glori Luis 10/07/2022 LARGE (A)  NEGATIVE Final  . RBC / HPF 10/07/2022 0-5  0 - 5 RBC/hpf Final  . WBC, UA 10/07/2022 0-5  0 - 5  WBC/hpf Final  . Bacteria, UA 10/07/2022 FEW (A)  NONE SEEN Final  . Squamous Epithelial / HPF 10/07/2022 11-20  0 - 5 Final   Performed at [facility], [address] [place], [place] [address]  . I-stat hCG, quantitative 10/07/2022 <5.0  <5 mIU/mL Final  . Comment 3 10/07/2022          Final   Comment:   GEST. AGE      CONC.  (mIU/mL)   <=1 WEEK        5 - 50     2 WEEKS       50 - 500     3 WEEKS       100 - 10,000     4 WEEKS     1,000 - 30,000        FEMALE AND NON-PREGNANT FEMALE:     LESS THAN 5 mIU/mL   . Sodium 10/07/2022 138  135 - 145 mmol/L Final  . Potassium 10/07/2022 4.3  3.5 - 5.1 mmol/L Final  . Chloride 10/07/2022 104  98 - 111 mmol/L Final  . CO2 10/07/2022 28  22 - 32 mmol/L Final  . Glucose, Bld 10/07/2022 90  70 - 99 mg/dL Final   Glucose reference range applies only to samples taken after fasting for at least 8 hours.  . BUN 10/07/2022 8  6 - 20 mg/dL Final  . Creatinine, Ser 10/07/2022 0.96  0.44 - 1.00 mg/dL Final  . Calcium 95/62/1308 9.1  8.9 - 10.3 mg/dL Final  . GFR, Estimated 10/07/2022 >60  >60 mL/min Final   Comment: (NOTE) Calculated using the CKD-EPI Creatinine Equation (2021)   . Anion gap 10/07/2022 6  5 - 15 Final   Performed at [facility], [address], [place], [place] [address]  . WBC 10/07/2022 5.8  4.0 - 10.5 K/uL Final  . RBC 10/07/2022 4.36  3.87 - 5.11 MIL/uL Final  . Hemoglobin 10/07/2022 12.9  12.0 - 15.0 g/dL Final  . HCT 69/62/9528 40.0  36.0 - 46.0 % Final  . MCV 10/07/2022 91.7  80.0 - 100.0 fL Final  . MCH 10/07/2022 29.6  26.0 - 34.0 pg Final  . MCHC 10/07/2022 32.3  30.0 - 36.0 g/dL Final  . RDW 41/32/4401 12.4  11.5 - 15.5 % Final  .  Platelets 10/07/2022 295  150 - 400 K/uL Final  . nRBC 10/07/2022 0.0  0.0 - 0.2 % Final   Performed at [facility], [address], [place], [place] [address]  . SARS Coronavirus 2 by RT PCR 10/07/2022 NEGATIVE  NEGATIVE Final   Comment: (NOTE) SARS-CoV-2 target  nucleic acids are NOT DETECTED.  The SARS-CoV-2 RNA is generally detectable in upper respiratory specimens during the acute phase of infection. The lowest concentration of SARS-CoV-2 viral copies this assay can detect is 138 copies/mL. A negative result does not preclude SARS-Cov-2 infection and should not be used as the sole basis for treatment or other patient management decisions. A negative result may occur with  improper specimen collection/handling, submission of specimen other than nasopharyngeal swab, presence of viral mutation(s) within the areas targeted by this assay, and inadequate number of viral copies(<138 copies/mL). A negative result must be combined with clinical observations, patient history, and epidemiological information. The expected result is Negative.  Fact Sheet for Patients:  BloggerCourse.com  Fact Sheet for Healthcare Providers:  SeriousBroker.it  This test is no                          t yet approved or cleared by the [place] FDA and  has been authorized for detection and/or diagnosis of SARS-CoV-2 by FDA under an Emergency Use Authorization (EUA). This EUA will remain  in effect (meaning this test can be used) for the duration of the COVID-19 declaration under Section 564(b)(1) of the Act, 21 U.S.C.section 360bbb-3(b)(1), unless the authorization is terminated  or revoked sooner.      . Influenza A by PCR 10/07/2022 NEGATIVE  NEGATIVE Final  . Influenza B by PCR 10/07/2022 NEGATIVE  NEGATIVE Final   Comment: (NOTE) The Xpert Xpress SARS-CoV-2/FLU/RSV plus assay is intended as an aid in the diagnosis of influenza from Nasopharyngeal swab specimens and should not be used as a sole basis for treatment. Nasal washings and aspirates are unacceptable for Xpert Xpress SARS-CoV-2/FLU/RSV testing.  Fact Sheet for Patients: BloggerCourse.com  Fact Sheet for Healthcare  Providers: SeriousBroker.it  This test is not yet approved or cleared by the [place] FDA and has been authorized for detection and/or diagnosis of SARS-CoV-2 by FDA under an Emergency Use Authorization (EUA). This EUA will remain in effect (meaning this test can be used) for the duration of the COVID-19 declaration under Section 564(b)(1) of the Act, 21 U.S.C. section 360bbb-3(b)(1), unless the authorization is terminated or revoked.    Marland Kitchen Resp Syncytial Virus by PCR 10/07/2022 NEGATIVE  NEGATIVE Final   Comment: (NOTE) Fact Sheet for Patients: BloggerCourse.com  Fact Sheet for Healthcare Providers: SeriousBroker.it  This test is not yet approved or cleared by the [place] FDA and has been authorized for detection and/or diagnosis of SARS-CoV-2 by FDA under an Emergency Use Authorization (EUA). This EUA will remain in effect (meaning this test can be used) for the duration of the COVID-19 declaration under Section 564(b)(1) of the Act, 21 U.S.C. section 360bbb-3(b)(1), unless the authorization is terminated or revoked.  Performed at [facility], [address], [place], [place] [address]   . Specimen Description 10/07/2022 URINE, CLEAN CATCH   Final  . Special Requests 10/07/2022    Final                   Value:NONE Performed at [facility], [address] [place], [place] [address]   .  Culture 10/07/2022 MULTIPLE SPECIES PRESENT, SUGGEST RECOLLECTION (A)   Final  . Report Status 10/07/2022 10/08/2022 FINAL   Final  Admission on 07/25/2022, Discharged on 10/07/2022  Component Date Value Ref Range Status  . SARS Coronavirus 2 by RT PCR 07/25/2022 NEGATIVE  NEGATIVE Final   Comment: (NOTE) SARS-CoV-2 target nucleic acids are NOT DETECTED.  The SARS-CoV-2 RNA is generally detectable in upper respiratory specimens during the acute phase of infection. The lowest concentration  of SARS-CoV-2 viral copies this assay can detect is 138 copies/mL. A negative result does not preclude SARS-Cov-2 infection and should not be used as the sole basis for treatment or other patient management decisions. A negative result may occur with  improper specimen collection/handling, submission of specimen other than nasopharyngeal swab, presence of viral mutation(s) within the areas targeted by this assay, and inadequate number of viral copies(<138 copies/mL). A negative result must be combined with clinical observations, patient history, and epidemiological information. The expected result is Negative.  Fact Sheet for Patients:  BloggerCourse.com  Fact Sheet for Healthcare Providers:  SeriousBroker.it  This test is no                          t yet approved or cleared by the [place] FDA and  has been authorized for detection and/or diagnosis of SARS-CoV-2 by FDA under an Emergency Use Authorization (EUA). This EUA will remain  in effect (meaning this test can be used) for the duration of the COVID-19 declaration under Section 564(b)(1) of the Act, 21 U.S.C.section 360bbb-3(b)(1), unless the authorization is terminated  or revoked sooner.      . Influenza A by PCR 07/25/2022 NEGATIVE  NEGATIVE Final  . Influenza B by PCR 07/25/2022 NEGATIVE  NEGATIVE Final   Comment: (NOTE) The Xpert Xpress SARS-CoV-2/FLU/RSV plus assay is intended as an aid in the diagnosis of influenza from Nasopharyngeal swab specimens and should not be used as a sole basis for treatment. Nasal washings and aspirates are unacceptable for Xpert Xpress SARS-CoV-2/FLU/RSV testing.  Fact Sheet for Patients: BloggerCourse.com  Fact Sheet for Healthcare Providers: SeriousBroker.it  This test is not yet approved or cleared by the [place] FDA and has been authorized for detection and/or diagnosis  of SARS-CoV-2 by FDA under an Emergency Use Authorization (EUA). This EUA will remain in effect (meaning this test can be used) for the duration of the COVID-19 declaration under Section 564(b)(1) of the Act, 21 U.S.C. section 360bbb-3(b)(1), unless the authorization is terminated or revoked.  Performed at [facility], [address], [place], [place] [address]   . WBC 07/25/2022 8.3  4.0 - 10.5 K/uL Final  . RBC 07/25/2022 4.44  3.87 - 5.11 MIL/uL Final  . Hemoglobin 07/25/2022 13.7  12.0 - 15.0 g/dL Final  . HCT 19/14/7829 40.2  36.0 - 46.0 % Final  . MCV 07/25/2022 90.5  80.0 - 100.0 fL Final  . MCH 07/25/2022 30.9  26.0 - 34.0 pg Final  . MCHC 07/25/2022 34.1  30.0 - 36.0 g/dL Final  . RDW 56/21/3086 12.2  11.5 - 15.5 % Final  . Platelets 07/25/2022 248  150 - 400 K/uL Final  . nRBC 07/25/2022 0.0  0.0 - 0.2 % Final  . Neutrophils Relative % 07/25/2022 43  % Final  . Neutro Abs 07/25/2022 3.6  1.7 - 7.7 K/uL Final  . Lymphocytes Relative 07/25/2022 52  % Final  . Lymphs Abs 07/25/2022 4.2 (H)  0.7 -  4.0 K/uL Final  . Monocytes Relative 07/25/2022 5  % Final  . Monocytes Absolute 07/25/2022 0.4  0.1 - 1.0 K/uL Final  . Eosinophils Relative 07/25/2022 0  % Final  . Eosinophils Absolute 07/25/2022 0.0  0.0 - 0.5 K/uL Final  . Basophils Relative 07/25/2022 0  % Final  . Basophils Absolute 07/25/2022 0.0  0.0 - 0.1 K/uL Final  . Immature Granulocytes 07/25/2022 0  % Final  . Abs Immature Granulocytes 07/25/2022 0.02  0.00 - 0.07 K/uL Final   Performed at [facility], [address] [place], [place] [address]  . Sodium 07/25/2022 138  135 - 145 mmol/L Final  . Potassium 07/25/2022 4.0  3.5 - 5.1 mmol/L Final  . Chloride 07/25/2022 104  98 - 111 mmol/L Final  . CO2 07/25/2022 29  22 - 32 mmol/L Final  . Glucose, Bld 07/25/2022 83  70 - 99 mg/dL Final   Glucose reference range applies only to samples taken after fasting for at least 8 hours.  . BUN 07/25/2022 11  6 -  20 mg/dL Final  . Creatinine, Ser 07/25/2022 0.97  0.44 - 1.00 mg/dL Final  . Calcium 19/14/7829 9.2  8.9 - 10.3 mg/dL Final  . Total Protein 07/25/2022 7.0  6.5 - 8.1 g/dL Final  . Albumin 56/21/3086 3.8  3.5 - 5.0 g/dL Final  . AST 57/84/6962 18  15 - 41 U/L Final  . ALT 07/25/2022 22  0 - 44 U/L Final  . Alkaline Phosphatase 07/25/2022 64  38 - 126 U/L Final  . Total Bilirubin 07/25/2022 0.2 (L)  0.3 - 1.2 mg/dL Final  . GFR, Estimated 07/25/2022 >60  >60 mL/min Final   Comment: (NOTE) Calculated using the CKD-EPI Creatinine Equation (2021)   . Anion gap 07/25/2022 5  5 - 15 Final   Performed at [facility], [address], [place], [place] [address]  . POC Amphetamine UR 07/25/2022 None Detected  NONE DETECTED (Cut Off Level 1000 ng/mL) Preliminary  . POC Secobarbital (BAR) 07/25/2022 None Detected  NONE DETECTED (Cut Off Level 300 ng/mL) Preliminary  . POC Buprenorphine (BUP) 07/25/2022 None Detected  NONE DETECTED (Cut Off Level 10 ng/mL) Preliminary  . POC Oxazepam (BZO) 07/25/2022 None Detected  NONE DETECTED (Cut Off Level 300 ng/mL) Preliminary  . POC Cocaine UR 07/25/2022 None Detected  NONE DETECTED (Cut Off Level 300 ng/mL) Preliminary  . POC Methamphetamine UR 07/25/2022 None Detected  NONE DETECTED (Cut Off Level 1000 ng/mL) Preliminary  . POC Morphine 07/25/2022 None Detected  NONE DETECTED (Cut Off Level 300 ng/mL) Preliminary  . POC Methadone UR 07/25/2022 None Detected  NONE DETECTED (Cut Off Level 300 ng/mL) Preliminary  . POC Oxycodone UR 07/25/2022 None Detected  NONE DETECTED (Cut Off Level 100 ng/mL) Preliminary  . POC Marijuana UR 07/25/2022 None Detected  NONE DETECTED (Cut Off Level 50 ng/mL) Preliminary  . SARSCOV2ONAVIRUS 2 AG 07/25/2022 NEGATIVE  NEGATIVE Final   Comment: (NOTE) SARS-CoV-2 antigen NOT DETECTED.   Negative results are presumptive.  Negative results do not preclude SARS-CoV-2 infection and should not be used as the sole basis  for treatment or other patient management decisions, including infection  control decisions, particularly in the presence of clinical signs and  symptoms consistent with COVID-19, or in those who have been in contact with the virus.  Negative results must be combined with clinical observations, patient history, and epidemiological information. The expected result is Negative.  Fact Sheet for Patients:  https://www.jennings-kim.com/  Fact Sheet for Healthcare Providers: https://alexander-rogers.biz/  This test is not yet approved or cleared by the [place] FDA and  has been authorized for detection and/or diagnosis of SARS-CoV-2 by FDA under an Emergency Use Authorization (EUA).  This EUA will remain in effect (meaning this test can be used) for the duration of  the COV                          ID-19 declaration under Section 564(b)(1) of the Act, 21 U.S.C. section 360bbb-3(b)(1), unless the authorization is terminated or revoked sooner.    . Cholesterol 07/25/2022 181  0 - 200 mg/dL Final  . Triglycerides 07/25/2022 118  <150 mg/dL Final  . HDL 81/19/1478 45  >40 mg/dL Final  . Total CHOL/HDL Ratio 07/25/2022 4.0  RATIO Final  . VLDL 07/25/2022 24  0 - 40 mg/dL Final  . LDL Cholesterol 07/25/2022 112 (H)  0 - 99 mg/dL Final   Comment:        Total Cholesterol/HDL:CHD Risk Coronary Heart Disease Risk Table                     Men   Women  1/2 Average Risk   3.4   3.3  Average Risk       5.0   4.4  2 X Average Risk   9.6   7.1  3 X Average Risk  23.4   11.0        Use the calculated Patient Ratio above and the CHD Risk Table to determine the patient's CHD Risk.        ATP III CLASSIFICATION (LDL):  <100     mg/dL   Optimal  295-621  mg/dL   Near or Above                    Optimal  130-159  mg/dL   Borderline  308-657  mg/dL   High  >846     mg/dL   Very High Performed at [facility], [address] [place], [place] [address]   .  TSH 07/25/2022 6.668 (H)  0.350 - 4.500 uIU/mL Final   Comment: Performed by a 3rd Generation assay with a functional sensitivity of <=0.01 uIU/mL. Performed at [facility], [address], [place], [place] [address]   . Glucose-Capillary 07/26/2022 104 (H)  70 - 99 mg/dL Final   Glucose reference range applies only to samples taken after fasting for at least 8 hours.  . T3, Free 07/31/2022 2.3  2.0 - 4.4 pg/mL Final   Comment: (NOTE) Performed At: [facility] [address] [place], [place] [id number] Jolene Schimke MD ZD:[id number]   . Free T4 07/31/2022 0.60 (L)  0.61 - 1.12 ng/dL Final   Comment: (NOTE) Biotin ingestion may interfere with free T4 tests. If the results are inconsistent with the TSH level, previous test results, or the clinical presentation, then consider biotin interference. If needed, order repeat testing after stopping biotin. Performed at [facility], [address], [place], [place] [address]   . Glucose-Capillary 08/29/2022 100 (H)  70 - 99 mg/dL Final   Glucose reference range applies only to samples taken after fasting for at least 8 hours.  Marland Kitchen TSH 09/05/2022 0.793  0.350 - 4.500 uIU/mL Final   Comment: Performed by a 3rd Generation assay with a functional sensitivity of <=0.01 uIU/mL. Performed at [facility], [address] [place], [place] [address]   . Free T4 09/05/2022 0.73  0.61 - 1.12 ng/dL Final   Comment: (NOTE) Biotin ingestion may interfere with free T4 tests. If the results are inconsistent with the TSH level, previous test results, or the clinical presentation, then consider biotin interference. If needed, order repeat testing after stopping biotin. Performed at [facility], [address] [place], [place] [address]   . Preg Test, Ur 09/06/2022 Negative  Negative Final  . Preg Test, Ur 09/06/2022 NEGATIVE  NEGATIVE Final   Comment:        THE SENSITIVITY OF THIS METHODOLOGY IS >24 mIU/mL   . Preg  Test, Ur 09/05/2022 NEGATIVE  NEGATIVE Final   Comment:        THE SENSITIVITY OF THIS METHODOLOGY IS >24 mIU/mL   . Sodium 09/13/2022 136  135 - 145 mmol/L Final  . Potassium 09/13/2022 4.5  3.5 - 5.1 mmol/L Final  . Chloride 09/13/2022 105  98 - 111 mmol/L Final  . CO2 09/13/2022 21 (L)  22 - 32 mmol/L Final  . Glucose, Bld 09/13/2022 111 (H)  70 - 99 mg/dL Final   Glucose reference range applies only to samples taken after fasting for at least 8 hours.  . BUN 09/13/2022 14  6 - 20 mg/dL Final  . Creatinine, Ser 09/13/2022 0.92  0.44 - 1.00 mg/dL Final  . Calcium 29/56/2130 9.1  8.9 - 10.3 mg/dL Final  . GFR, Estimated 09/13/2022 >60  >60 mL/min Final   Comment: (NOTE) Calculated using the CKD-EPI Creatinine Equation (2021)   . Anion gap 09/13/2022 10  5 - 15 Final   Performed at [facility], [address], [place], [place] [address]  . Vitamin B-12 09/13/2022 585  180 - 914 pg/mL Final   Comment: (NOTE) This assay is not validated for testing neonatal or myeloproliferative syndrome specimens for Vitamin B12 levels. Performed at [facility], [address], [place], [place] [address]   . Color, Urine 09/14/2022 YELLOW  YELLOW Final  . APPearance 09/14/2022 HAZY (A)  CLEAR Final  . Specific Gravity, Urine 09/14/2022 1.025  1.005 - 1.030 Final  . pH 09/14/2022 5.0  5.0 - 8.0 Final  . Glucose, UA 09/14/2022 NEGATIVE  NEGATIVE mg/dL Final  . Hgb urine dipstick 09/14/2022 NEGATIVE  NEGATIVE Final  . Bilirubin Urine 09/14/2022 NEGATIVE  NEGATIVE Final  . Ketones, ur 09/14/2022 NEGATIVE  NEGATIVE mg/dL Final  . Protein, ur 95/28/4132 NEGATIVE  NEGATIVE mg/dL Final  . Nitrite 44/10/270 NEGATIVE  NEGATIVE Final  . Glori Luis 09/14/2022 TRACE (A)  NEGATIVE Final  . RBC / HPF 09/14/2022 0-5  0 - 5 RBC/hpf Final  . WBC, UA 09/14/2022 0-5  0 - 5 WBC/hpf Final  . Bacteria, UA 09/14/2022 RARE (A)  NONE SEEN Final  . Squamous Epithelial / HPF 09/14/2022 0-5  0 - 5 Final   . Mucus 09/14/2022 PRESENT   Final   Performed at [facility], [address], [place], [place] [address]  . Glucose-Capillary 09/16/2022 105 (H)  70 - 99 mg/dL Final   Glucose reference range applies only to samples taken after fasting for at least 8 hours.  Marland Kitchen SARS Coronavirus 2 by RT PCR 09/30/2022 NEGATIVE  NEGATIVE Final   Comment: (NOTE) SARS-CoV-2 target nucleic acids are NOT DETECTED.  The SARS-CoV-2 RNA is generally detectable in upper respiratory specimens during the acute phase of infection. The lowest  concentration of SARS-CoV-2 viral copies this assay can detect is 138 copies/mL. A negative result does not preclude SARS-Cov-2 infection and should not be used as the sole basis for treatment or other patient management decisions. A negative result may occur with  improper specimen collection/handling, submission of specimen other than nasopharyngeal swab, presence of viral mutation(s) within the areas targeted by this assay, and inadequate number of viral copies(<138 copies/mL). A negative result must be combined with clinical observations, patient history, and epidemiological information. The expected result is Negative.  Fact Sheet for Patients:  BloggerCourse.com  Fact Sheet for Healthcare Providers:  SeriousBroker.it  This test is no                          t yet approved or cleared by the [place] FDA and  has been authorized for detection and/or diagnosis of SARS-CoV-2 by FDA under an Emergency Use Authorization (EUA). This EUA will remain  in effect (meaning this test can be used) for the duration of the COVID-19 declaration under Section 564(b)(1) of the Act, 21 U.S.C.section 360bbb-3(b)(1), unless the authorization is terminated  or revoked sooner.      . Influenza A by PCR 09/30/2022 NEGATIVE  NEGATIVE Final  . Influenza B by PCR 09/30/2022 NEGATIVE  NEGATIVE Final   Comment: (NOTE) The Xpert  Xpress SARS-CoV-2/FLU/RSV plus assay is intended as an aid in the diagnosis of influenza from Nasopharyngeal swab specimens and should not be used as a sole basis for treatment. Nasal washings and aspirates are unacceptable for Xpert Xpress SARS-CoV-2/FLU/RSV testing.  Fact Sheet for Patients: BloggerCourse.com  Fact Sheet for Healthcare Providers: SeriousBroker.it  This test is not yet approved or cleared by the [place] FDA and has been authorized for detection and/or diagnosis of SARS-CoV-2 by FDA under an Emergency Use Authorization (EUA). This EUA will remain in effect (meaning this test can be used) for the duration of the COVID-19 declaration under Section 564(b)(1) of the Act, 21 U.S.C. section 360bbb-3(b)(1), unless the authorization is terminated or revoked.    Marland Kitchen Resp Syncytial Virus by PCR 09/30/2022 NEGATIVE  NEGATIVE Final   Comment: (NOTE) Fact Sheet for Patients: BloggerCourse.com  Fact Sheet for Healthcare Providers: SeriousBroker.it  This test is not yet approved or cleared by the [place] FDA and has been authorized for detection and/or diagnosis of SARS-CoV-2 by FDA under an Emergency Use Authorization (EUA). This EUA will remain in effect (meaning this test can be used) for the duration of the COVID-19 declaration under Section 564(b)(1) of the Act, 21 U.S.C. section 360bbb-3(b)(1), unless the authorization is terminated or revoked.  Performed at [facility], [address], [place], [place] [address]   . Sodium 10/01/2022 136  135 - 145 mmol/L Final  . Potassium 10/01/2022 3.8  3.5 - 5.1 mmol/L Final  . Chloride 10/01/2022 104  98 - 111 mmol/L Final  . CO2 10/01/2022 27  22 - 32 mmol/L Final  . Glucose, Bld 10/01/2022 98  70 - 99 mg/dL Final   Glucose reference range applies only to samples taken after fasting for at least 8 hours.  .  BUN 10/01/2022 12  6 - 20 mg/dL Final  . Creatinine, Ser 10/01/2022 0.98  0.44 - 1.00 mg/dL Final  . Calcium 47/82/9562 8.9  8.9 - 10.3 mg/dL Final  . GFR, Estimated 10/01/2022 >60  >60 mL/min Final   Comment: (NOTE) Calculated using the CKD-EPI Creatinine Equation (2021)   .  Anion gap 10/01/2022 5  5 - 15 Final   Performed at [facility], [address] [place], [place] [address]  . WBC 10/01/2022 5.1  4.0 - 10.5 K/uL Final  . RBC 10/01/2022 4.31  3.87 - 5.11 MIL/uL Final  . Hemoglobin 10/01/2022 12.7  12.0 - 15.0 g/dL Final  . HCT 21/30/8657 38.8  36.0 - 46.0 % Final  . MCV 10/01/2022 90.0  80.0 - 100.0 fL Final  . MCH 10/01/2022 29.5  26.0 - 34.0 pg Final  . MCHC 10/01/2022 32.7  30.0 - 36.0 g/dL Final  . RDW 84/69/6295 12.4  11.5 - 15.5 % Final  . Platelets 10/01/2022 300  150 - 400 K/uL Final  . nRBC 10/01/2022 0.0  0.0 - 0.2 % Final   Performed at [facility], [address] [place], [place] [address]  . Lipase 10/01/2022 29  11 - 51 U/L Final   Performed at [facility], [address], [place], [place] [address]  . Color, Urine 10/05/2022 YELLOW  YELLOW Final  . APPearance 10/05/2022 HAZY (A)  CLEAR Final  . Specific Gravity, Urine 10/05/2022 1.018  1.005 - 1.030 Final  . pH 10/05/2022 5.0  5.0 - 8.0 Final  . Glucose, UA 10/05/2022 NEGATIVE  NEGATIVE mg/dL Final  . Hgb urine dipstick 10/05/2022 NEGATIVE  NEGATIVE Final  . Bilirubin Urine 10/05/2022 NEGATIVE  NEGATIVE Final  . Ketones, ur 10/05/2022 NEGATIVE  NEGATIVE mg/dL Final  . Protein, ur 02/72/5366 NEGATIVE  NEGATIVE mg/dL Final  . Nitrite 44/12/4740 NEGATIVE  NEGATIVE Final  . Glori Luis 10/05/2022 TRACE (A)  NEGATIVE Final  . RBC / HPF 10/05/2022 0-5  0 - 5 RBC/hpf Final  . WBC, UA 10/05/2022 0-5  0 - 5 WBC/hpf Final  . Bacteria, UA 10/05/2022 RARE (A)  NONE SEEN Final  . Squamous Epithelial / HPF 10/05/2022 0-5  0 - 5 Final  . Mucus 10/05/2022 PRESENT   Final   Performed at [facility], [address], [place], [place] [address]  . Specimen Description 10/05/2022 URINE, CLEAN CATCH   Final  . Special Requests 10/05/2022    Final                   Value:NONE Performed at [facility], [address] [place], [place] [address]   . Culture 10/05/2022 10,000 COLONIES/mL MULTIPLE SPECIES PRESENT, SUGGEST RECOLLECTION (A)   Final  . Report Status 10/05/2022 10/07/2022 FINAL   Final    Allergies: Apple juice, Other, and Watermelon flavor  Medications:  Facility Ordered Medications  Medication  . acetaminophen (TYLENOL) tablet 650 mg  . alum & mag hydroxide-simeth (MAALOX/MYLANTA) 200-200-20 MG/5ML suspension 30 mL  . magnesium hydroxide (MILK OF MAGNESIA) suspension 30 mL  . hydrOXYzine (ATARAX) tablet 25 mg  . traZODone (DESYREL) tablet 50 mg  . levothyroxine (SYNTHROID) tablet 100 mcg  . sertraline (ZOLOFT) tablet 150 mg  . metFORMIN (GLUCOPHAGE) tablet 500 mg  . QUEtiapine (SEROQUEL) tablet 400 mg   PTA Medications  Medication Sig  . ABILIFY MAINTENA 400 MG PRSY prefilled syringe 400 mg every 28 (twenty-eight) days.  Marland Kitchen sertraline (ZOLOFT) 50 MG tablet Take 3 tablets (150 mg total) by mouth daily.  . traZODone (DESYREL) 100 MG tablet Take 1 tablet (100 mg total) by mouth at bedtime.  Marland Kitchen QUEtiapine (SEROQUEL) 400 MG tablet Take 1 tablet (400 mg total) by mouth 2 (two) times daily.  . Oxcarbazepine (TRILEPTAL) 300  MG tablet TAKE 1 TABLET(300 MG) BY MOUTH TWICE DAILY    Medical Decision Making  Recommend admission to the continuous observation overnight for safety monitoring and re-eval for SI/HI/AVH or paranoia in AM for final disposition.  Lab Orders         SARS Coronavirus 2 by RT PCR (hospital order, performed in [facility] hospital lab) *cepheid single result test* Anterior Nasal Swab         CBC with Differential/Platelet         Comprehensive metabolic panel         POC urine preg, ED         POCT Urine Drug Screen - (I-Screen)      Home  medications reordered at this encounter -Synthroid 100 mcg p.o. daily for hypothyroidism -Metformin 500 mg p.o. daily with breakfast for type 2 diabetes -Seroquel 400 mg p.o. twice daily for mood stabilization -Sertraline 150 mg p.o. daily for depressive symptoms/anxiety  Other PRNs -Tylenol 650 mg p.o. every 6 hours as needed for pain -Maalox 30 mL p.o. every 4 hours as needed indigestion -Atarax 25 mg p.o. 3 times daily as needed anxiety -MOM 30 mL p.o. daily as needed constipation -Trazodone 50 mg p.o. nightly as needed insomnia   Recommendations  Based on my evaluation the patient does not appear to have an emergency medical condition.  Recommend admission to the continuous observation overnight for safety monitoring and re-eval for SI/HI/AVH or paranoia in AM for final disposition.  Mancel Bale, NP 01/30/23  12:05 AM

## 2023-01-29 NOTE — ED Provider Notes (Signed)
[facility] Continuous Assessment Admission H&P  Date: 01/30/23 Patient Name: Nicole [name] MRN: [id number] Chief Complaint: " I feel like overdosing on my sleeping pills".  Diagnoses:  Final diagnoses:  Suicidal ideation    HPI: Nicole [name] is a 32 y.o. female with psychiatric history of schizophrenia unspecified, MDD with psychosis, who presented voluntarily to [facility] via [facility] after she called and reported feeling like overdosing on her sleeping pills and not feeling safe with herself.  Patient was seen face-to-face by this provider and chart reviewed.. Patient is well-known to the behavioral health service line, and has had 5 ED visits in the past 6 months.  Patient was recently seen here at the [facility]  12/04/2022 with complaints of insomnia, anxiety and  request for medication refill. Patient was also discharged from the [facility] 11/23/22 where she has remained a boarder for four months after she presented on (07/23/2022) via [employer] after a verbal altercation with [name] (not patient's legal guardian) for transient SI, which was patient's fourth visit to [facility] for similar concerns last year. Patient has been dismissed from previous group home due to threatening SI and HI, then leaving the group home and remained in continuous assessment unit as a boarder until her discharge to transitional housing 11/23/22 with F/U outpatient appts at the [facility] for medication magt and counseling.  Patient is currently followed by [facility] outpatient psychiatric services/walk-in clinic for medication management, and reports she is prescribed  Sertraline, Trazodone, Abilify Maintena injection, and other psychiatric medications she is unable to recall their names.   Patient reports she was seen today by a representative of [facility] for an assessment and is in the process of starting therapy soon.   On tonight's visit, patient reports "I found out my aunt has stage IV cancer and I feel like  overdosing on my sleeping pills and I've had the feeling throughout the day, and I'm also hearing a voice telling me nobody cares and I should just die".   Patient reports her sleep is poor and appetite is fair.  Patient reports she lives a transitional house with a roommate.  Patient reports she is compliant with her medications and recently received a shot of her Abilify Maintena injection, but is unsure of the date.  Patient denies access to guns or weapons.  Patient reports she is still suicidal, feels unsafe with herself, and is unable to contract for safety at this time.   Support, encouragement and reassurance provided about ongoing stressors. Patient is provided with opportunity for questions.   On evaluation, patient is alert, oriented x 3, calm and cooperative. Speech is clear and coherent. Pt appears well groomed. Eye contact is good. Mood is anxious and depressed, affect is congruent with mood. Thought process is coherent and thought content is WDL. Pt endorses SI with plan, endorses AH, denies HI/VH or paranoia. There is objective indication that the patient is responding to internal stimuli. As she constantly rocks her body back and forth throughout this evaluation. No delusions elicited during this assessment.    Discussed admission to the continuous observation unit overnight for safety monitoring and re-eval for SI/HI/AVH in am, patient is in agreement.   Total Time spent with patient: 20 minutes  Musculoskeletal  Strength & Muscle Tone: within normal limits Gait & Station: normal Patient leans: N/A  Psychiatric Specialty Exam  Presentation General Appearance:  Well Groomed  Eye Contact: Good  Speech: Clear and Coherent  Speech Volume: Normal  Handedness: Right  Mood and Affect  Mood: Anxious; Depressed  Affect: Congruent   Thought Process  Thought Processes: Coherent  Descriptions of Associations:Intact  Orientation:Full (Time, Place and  Person)  Thought Content:WDL  Diagnosis of Schizophrenia or Schizoaffective disorder in past: Yes  Duration of Psychotic Symptoms: Greater than six months  Hallucinations:Hallucinations: Auditory Description of Auditory Hallucinations: Patient reports hearing voices  telling her nobody cares and she should just go and die Description of Visual Hallucinations: denies  Ideas of Reference:None  Suicidal Thoughts:Suicidal Thoughts: Yes, Active SI Active Intent and/or Plan: With Plan  Homicidal Thoughts:Homicidal Thoughts: No   Sensorium  Memory: Immediate Fair  Judgment: Intact  Insight: Poor   Executive Functions  Concentration: Good  Attention Span: Good  Recall: Fair  Fund of Knowledge: Good  Language: Good   Psychomotor Activity  Psychomotor Activity: Psychomotor Activity: Normal   Assets  Assets: Communication Skills; Desire for Improvement; Social Support   Sleep  Sleep: Sleep: Poor   Nutritional Assessment (For OBS and FBC admissions only) Has the patient had a weight loss or gain of 10 pounds or more in the last 3 months?: No Has the patient had a decrease in food intake/or appetite?: No Does the patient have dental problems?: No Does the patient have eating habits or behaviors that may be indicators of an eating disorder including binging or inducing vomiting?: No Has the patient recently lost weight without trying?: 0 Has the patient been eating poorly because of a decreased appetite?: 0 Malnutrition Screening Tool Score: 0    Physical Exam Constitutional:      General: She is not in acute distress.    Appearance: She is not diaphoretic.  HENT:     Head: Normocephalic.     Right Ear: External ear normal.     Left Ear: External ear normal.     Nose: No congestion.  Eyes:     General:        Right eye: No discharge.        Left eye: No discharge.  Cardiovascular:     Rate and Rhythm: Normal rate.  Pulmonary:     Effort: No  respiratory distress.  Chest:     Chest wall: No tenderness.  Neurological:     Mental Status: She is alert and oriented to person, place, and time.  Psychiatric:        Attention and Perception: She perceives auditory hallucinations.        Mood and Affect: Mood is anxious and depressed.        Speech: Speech normal.        Behavior: Behavior is cooperative.        Thought Content: Thought content is not paranoid or delusional. Thought content includes suicidal ideation. Thought content does not include homicidal ideation. Thought content includes suicidal plan. Thought content does not include homicidal plan.    Review of Systems  Constitutional:  Negative for chills, diaphoresis and fever.  HENT:  Negative for congestion.   Eyes:  Negative for discharge.  Respiratory:  Negative for cough, shortness of breath and wheezing.   Cardiovascular:  Negative for chest pain and palpitations.  Gastrointestinal:  Negative for diarrhea, nausea and vomiting.  Neurological:  Negative for tingling, seizures, loss of consciousness, weakness and headaches.  Psychiatric/Behavioral:  Positive for depression and suicidal ideas. The patient is nervous/anxious.     Blood pressure (!) 128/97, pulse 67, temperature 98.3 F (36.8 C), resp. rate 18, SpO2 100 %. There is no height  or weight on file to calculate BMI.  Past Psychiatric History: See H & P   Is the patient at risk to self? Yes  Has the patient been a risk to self in the past 6 months? Yes .    Has the patient been a risk to self within the distant past? Yes   Is the patient a risk to others? No   Has the patient been a risk to others in the past 6 months? No   Has the patient been a risk to others within the distant past? No   Past Medical History: See Chart  Family History: N/A  Social History: N/A  Last Labs:  Admission on 10/07/2022, Discharged on 11/23/2022  Component Date Value Ref Range Status   Hgb A1c MFr Bld 10/15/2022 5.7  (H)  4.8 - 5.6 % Final   Comment: (NOTE)         Prediabetes: 5.7 - 6.4         Diabetes: >6.4         Glycemic control for adults with diabetes: <7.0    Mean Plasma Glucose 10/15/2022 117  mg/dL Final   Comment: (NOTE) Performed At: [facility] [address] [place], [place] [id number] Jolene Schimke MD WU:[id number]    Color, Urine 11/13/2022 YELLOW  YELLOW Final   APPearance 11/13/2022 CLOUDY (A)  CLEAR Final   Specific Gravity, Urine 11/13/2022 1.023  1.005 - 1.030 Final   pH 11/13/2022 6.0  5.0 - 8.0 Final   Glucose, UA 11/13/2022 NEGATIVE  NEGATIVE mg/dL Final   Hgb urine dipstick 11/13/2022 NEGATIVE  NEGATIVE Final   Bilirubin Urine 11/13/2022 NEGATIVE  NEGATIVE Final   Ketones, ur 11/13/2022 NEGATIVE  NEGATIVE mg/dL Final   Protein, ur 95/62/1308 NEGATIVE  NEGATIVE mg/dL Final   Nitrite 65/78/4696 NEGATIVE  NEGATIVE Final   Leukocytes,Ua 11/13/2022 NEGATIVE  NEGATIVE Final   Performed at [facility], [address] [place], [place] [address]  Office Visit on 10/21/2022  Component Date Value Ref Range Status   SARSCOV2ONAVIRUS 2 AG 11/01/2022 NEGATIVE  NEGATIVE Final   Comment: (NOTE) SARS-CoV-2 antigen NOT DETECTED.   Negative results are presumptive.  Negative results do not preclude SARS-CoV-2 infection and should not be used as the sole basis for treatment or other patient management decisions, including infection  control decisions, particularly in the presence of clinical signs and  symptoms consistent with COVID-19, or in those who have been in contact with the virus.  Negative results must be combined with clinical observations, patient history, and epidemiological information. The expected result is Negative.  Fact Sheet for Patients: https://www.jennings-kim.com/  Fact Sheet for Healthcare Providers: https://alexander-rogers.biz/  This test is not yet approved or cleared by the [place] FDA and  has been  authorized for detection and/or diagnosis of SARS-CoV-2 by FDA under an Emergency Use Authorization (EUA).  This EUA will remain in effect (meaning this test can be used) for the duration of  the COV                          ID-19 declaration under Section 564(b)(1) of the Act, 21 U.S.C. section 360bbb-3(b)(1), unless the authorization is terminated or revoked sooner.    Admission on 10/07/2022, Discharged on 10/07/2022  Component Date Value Ref Range Status   Color, Urine 10/07/2022 YELLOW  YELLOW Final   APPearance 10/07/2022 HAZY (A)  CLEAR Final   Specific Gravity, Urine 10/07/2022 1.011  1.005 - 1.030 Final   pH 10/07/2022 7.0  5.0 - 8.0 Final   Glucose, UA 10/07/2022 NEGATIVE  NEGATIVE mg/dL Final   Hgb urine dipstick 10/07/2022 SMALL (A)  NEGATIVE Final   Bilirubin Urine 10/07/2022 NEGATIVE  NEGATIVE Final   Ketones, ur 10/07/2022 NEGATIVE  NEGATIVE mg/dL Final   Protein, ur 45/40/9811 NEGATIVE  NEGATIVE mg/dL Final   Nitrite 91/47/8295 NEGATIVE  NEGATIVE Final   Leukocytes,Ua 10/07/2022 LARGE (A)  NEGATIVE Final   RBC / HPF 10/07/2022 0-5  0 - 5 RBC/hpf Final   WBC, UA 10/07/2022 0-5  0 - 5 WBC/hpf Final   Bacteria, UA 10/07/2022 FEW (A)  NONE SEEN Final   Squamous Epithelial / HPF 10/07/2022 11-20  0 - 5 Final   Performed at [facility], [address] [place], [place] [address]   I-stat hCG, quantitative 10/07/2022 <5.0  <5 mIU/mL Final   Comment 3 10/07/2022          Final   Comment:   GEST. AGE      CONC.  (mIU/mL)   <=1 WEEK        5 - 50     2 WEEKS       50 - 500     3 WEEKS       100 - 10,000     4 WEEKS     1,000 - 30,000        FEMALE AND NON-PREGNANT FEMALE:     LESS THAN 5 mIU/mL    Sodium 10/07/2022 138  135 - 145 mmol/L Final   Potassium 10/07/2022 4.3  3.5 - 5.1 mmol/L Final   Chloride 10/07/2022 104  98 - 111 mmol/L Final   CO2 10/07/2022 28  22 - 32 mmol/L Final   Glucose, Bld 10/07/2022 90  70 - 99 mg/dL Final   Glucose reference range  applies only to samples taken after fasting for at least 8 hours.   BUN 10/07/2022 8  6 - 20 mg/dL Final   Creatinine, Ser 10/07/2022 0.96  0.44 - 1.00 mg/dL Final   Calcium 86/57/8469 9.1  8.9 - 10.3 mg/dL Final   GFR, Estimated 10/07/2022 >60  >60 mL/min Final   Comment: (NOTE) Calculated using the CKD-EPI Creatinine Equation (2021)    Anion gap 10/07/2022 6  5 - 15 Final   Performed at [facility], [address], [place], [place] [address]   WBC 10/07/2022 5.8  4.0 - 10.5 K/uL Final   RBC 10/07/2022 4.36  3.87 - 5.11 MIL/uL Final   Hemoglobin 10/07/2022 12.9  12.0 - 15.0 g/dL Final   HCT 84/13/2440 40.0  36.0 - 46.0 % Final   MCV 10/07/2022 91.7  80.0 - 100.0 fL Final   MCH 10/07/2022 29.6  26.0 - 34.0 pg Final   MCHC 10/07/2022 32.3  30.0 - 36.0 g/dL Final   RDW 08/10/2535 12.4  11.5 - 15.5 % Final   Platelets 10/07/2022 295  150 - 400 K/uL Final   nRBC 10/07/2022 0.0  0.0 - 0.2 % Final   Performed at [facility], [address], [place], [place] [address]   SARS Coronavirus 2 by RT PCR 10/07/2022 NEGATIVE  NEGATIVE Final   Comment: (NOTE) SARS-CoV-2 target nucleic acids are NOT DETECTED.  The SARS-CoV-2 RNA is generally detectable in upper respiratory specimens during the acute phase of infection. The lowest concentration of SARS-CoV-2 viral copies this assay can detect is 138 copies/mL. A  negative result does not preclude SARS-Cov-2 infection and should not be used as the sole basis for treatment or other patient management decisions. A negative result may occur with  improper specimen collection/handling, submission of specimen other than nasopharyngeal swab, presence of viral mutation(s) within the areas targeted by this assay, and inadequate number of viral copies(<138 copies/mL). A negative result must be combined with clinical observations, patient history, and epidemiological information. The expected result is Negative.  Fact Sheet for Patients:   BloggerCourse.com  Fact Sheet for Healthcare Providers:  SeriousBroker.it  This test is no                          t yet approved or cleared by the [place] FDA and  has been authorized for detection and/or diagnosis of SARS-CoV-2 by FDA under an Emergency Use Authorization (EUA). This EUA will remain  in effect (meaning this test can be used) for the duration of the COVID-19 declaration under Section 564(b)(1) of the Act, 21 U.S.C.section 360bbb-3(b)(1), unless the authorization is terminated  or revoked sooner.       Influenza A by PCR 10/07/2022 NEGATIVE  NEGATIVE Final   Influenza B by PCR 10/07/2022 NEGATIVE  NEGATIVE Final   Comment: (NOTE) The Xpert Xpress SARS-CoV-2/FLU/RSV plus assay is intended as an aid in the diagnosis of influenza from Nasopharyngeal swab specimens and should not be used as a sole basis for treatment. Nasal washings and aspirates are unacceptable for Xpert Xpress SARS-CoV-2/FLU/RSV testing.  Fact Sheet for Patients: BloggerCourse.com  Fact Sheet for Healthcare Providers: SeriousBroker.it  This test is not yet approved or cleared by the [place] FDA and has been authorized for detection and/or diagnosis of SARS-CoV-2 by FDA under an Emergency Use Authorization (EUA). This EUA will remain in effect (meaning this test can be used) for the duration of the COVID-19 declaration under Section 564(b)(1) of the Act, 21 U.S.C. section 360bbb-3(b)(1), unless the authorization is terminated or revoked.     Resp Syncytial Virus by PCR 10/07/2022 NEGATIVE  NEGATIVE Final   Comment: (NOTE) Fact Sheet for Patients: BloggerCourse.com  Fact Sheet for Healthcare Providers: SeriousBroker.it  This test is not yet approved or cleared by the [place] FDA and has been authorized for detection and/or  diagnosis of SARS-CoV-2 by FDA under an Emergency Use Authorization (EUA). This EUA will remain in effect (meaning this test can be used) for the duration of the COVID-19 declaration under Section 564(b)(1) of the Act, 21 U.S.C. section 360bbb-3(b)(1), unless the authorization is terminated or revoked.  Performed at [facility], [address] [place], [place] [address]    Specimen Description 10/07/2022 URINE, CLEAN CATCH   Final   Special Requests 10/07/2022    Final                   Value:NONE Performed at [facility], [address] [place], [place] [address]    Culture 10/07/2022 MULTIPLE SPECIES PRESENT, SUGGEST RECOLLECTION (A)   Final   Report Status 10/07/2022 10/08/2022 FINAL   Final  Admission on 07/25/2022, Discharged on 10/07/2022  Component Date Value Ref Range Status   SARS Coronavirus 2 by RT PCR 07/25/2022 NEGATIVE  NEGATIVE Final   Comment: (NOTE) SARS-CoV-2 target nucleic acids are NOT DETECTED.  The SARS-CoV-2 RNA is generally detectable in upper respiratory specimens during the acute phase of infection. The lowest concentration of SARS-CoV-2 viral copies this assay can detect is  138 copies/mL. A negative result does not preclude SARS-Cov-2 infection and should not be used as the sole basis for treatment or other patient management decisions. A negative result may occur with  improper specimen collection/handling, submission of specimen other than nasopharyngeal swab, presence of viral mutation(s) within the areas targeted by this assay, and inadequate number of viral copies(<138 copies/mL). A negative result must be combined with clinical observations, patient history, and epidemiological information. The expected result is Negative.  Fact Sheet for Patients:  BloggerCourse.com  Fact Sheet for Healthcare Providers:  SeriousBroker.it  This test is no                          t yet approved  or cleared by the [place] FDA and  has been authorized for detection and/or diagnosis of SARS-CoV-2 by FDA under an Emergency Use Authorization (EUA). This EUA will remain  in effect (meaning this test can be used) for the duration of the COVID-19 declaration under Section 564(b)(1) of the Act, 21 U.S.C.section 360bbb-3(b)(1), unless the authorization is terminated  or revoked sooner.       Influenza A by PCR 07/25/2022 NEGATIVE  NEGATIVE Final   Influenza B by PCR 07/25/2022 NEGATIVE  NEGATIVE Final   Comment: (NOTE) The Xpert Xpress SARS-CoV-2/FLU/RSV plus assay is intended as an aid in the diagnosis of influenza from Nasopharyngeal swab specimens and should not be used as a sole basis for treatment. Nasal washings and aspirates are unacceptable for Xpert Xpress SARS-CoV-2/FLU/RSV testing.  Fact Sheet for Patients: BloggerCourse.com  Fact Sheet for Healthcare Providers: SeriousBroker.it  This test is not yet approved or cleared by the [place] FDA and has been authorized for detection and/or diagnosis of SARS-CoV-2 by FDA under an Emergency Use Authorization (EUA). This EUA will remain in effect (meaning this test can be used) for the duration of the COVID-19 declaration under Section 564(b)(1) of the Act, 21 U.S.C. section 360bbb-3(b)(1), unless the authorization is terminated or revoked.  Performed at [facility], [address] [place], [place] [address]    WBC 07/25/2022 8.3  4.0 - 10.5 K/uL Final   RBC 07/25/2022 4.44  3.87 - 5.11 MIL/uL Final   Hemoglobin 07/25/2022 13.7  12.0 - 15.0 g/dL Final   HCT 91/47/8295 40.2  36.0 - 46.0 % Final   MCV 07/25/2022 90.5  80.0 - 100.0 fL Final   MCH 07/25/2022 30.9  26.0 - 34.0 pg Final   MCHC 07/25/2022 34.1  30.0 - 36.0 g/dL Final   RDW 62/13/0865 12.2  11.5 - 15.5 % Final   Platelets 07/25/2022 248  150 - 400 K/uL Final   nRBC 07/25/2022 0.0  0.0 - 0.2 %  Final   Neutrophils Relative % 07/25/2022 43  % Final   Neutro Abs 07/25/2022 3.6  1.7 - 7.7 K/uL Final   Lymphocytes Relative 07/25/2022 52  % Final   Lymphs Abs 07/25/2022 4.2 (H)  0.7 - 4.0 K/uL Final   Monocytes Relative 07/25/2022 5  % Final   Monocytes Absolute 07/25/2022 0.4  0.1 - 1.0 K/uL Final   Eosinophils Relative 07/25/2022 0  % Final   Eosinophils Absolute 07/25/2022 0.0  0.0 - 0.5 K/uL Final   Basophils Relative 07/25/2022 0  % Final   Basophils Absolute 07/25/2022 0.0  0.0 - 0.1 K/uL Final   Immature Granulocytes 07/25/2022 0  % Final   Abs Immature Granulocytes 07/25/2022 0.02  0.00 - 0.07 K/uL  Final   Performed at [facility], [address] [place], [place] [address]   Sodium 07/25/2022 138  135 - 145 mmol/L Final   Potassium 07/25/2022 4.0  3.5 - 5.1 mmol/L Final   Chloride 07/25/2022 104  98 - 111 mmol/L Final   CO2 07/25/2022 29  22 - 32 mmol/L Final   Glucose, Bld 07/25/2022 83  70 - 99 mg/dL Final   Glucose reference range applies only to samples taken after fasting for at least 8 hours.   BUN 07/25/2022 11  6 - 20 mg/dL Final   Creatinine, Ser 07/25/2022 0.97  0.44 - 1.00 mg/dL Final   Calcium 60/45/4098 9.2  8.9 - 10.3 mg/dL Final   Total Protein 11/91/4782 7.0  6.5 - 8.1 g/dL Final   Albumin 95/62/1308 3.8  3.5 - 5.0 g/dL Final   AST 65/78/4696 18  15 - 41 U/L Final   ALT 07/25/2022 22  0 - 44 U/L Final   Alkaline Phosphatase 07/25/2022 64  38 - 126 U/L Final   Total Bilirubin 07/25/2022 0.2 (L)  0.3 - 1.2 mg/dL Final   GFR, Estimated 07/25/2022 >60  >60 mL/min Final   Comment: (NOTE) Calculated using the CKD-EPI Creatinine Equation (2021)    Anion gap 07/25/2022 5  5 - 15 Final   Performed at [facility], [address] [place], [place] [address]   POC Amphetamine UR 07/25/2022 None Detected  NONE DETECTED (Cut Off Level 1000 ng/mL) Preliminary   POC Secobarbital (BAR) 07/25/2022 None Detected  NONE DETECTED (Cut Off Level 300 ng/mL)  Preliminary   POC Buprenorphine (BUP) 07/25/2022 None Detected  NONE DETECTED (Cut Off Level 10 ng/mL) Preliminary   POC Oxazepam (BZO) 07/25/2022 None Detected  NONE DETECTED (Cut Off Level 300 ng/mL) Preliminary   POC Cocaine UR 07/25/2022 None Detected  NONE DETECTED (Cut Off Level 300 ng/mL) Preliminary   POC Methamphetamine UR 07/25/2022 None Detected  NONE DETECTED (Cut Off Level 1000 ng/mL) Preliminary   POC Morphine 07/25/2022 None Detected  NONE DETECTED (Cut Off Level 300 ng/mL) Preliminary   POC Methadone UR 07/25/2022 None Detected  NONE DETECTED (Cut Off Level 300 ng/mL) Preliminary   POC Oxycodone UR 07/25/2022 None Detected  NONE DETECTED (Cut Off Level 100 ng/mL) Preliminary   POC Marijuana UR 07/25/2022 None Detected  NONE DETECTED (Cut Off Level 50 ng/mL) Preliminary   SARSCOV2ONAVIRUS 2 AG 07/25/2022 NEGATIVE  NEGATIVE Final   Comment: (NOTE) SARS-CoV-2 antigen NOT DETECTED.   Negative results are presumptive.  Negative results do not preclude SARS-CoV-2 infection and should not be used as the sole basis for treatment or other patient management decisions, including infection  control decisions, particularly in the presence of clinical signs and  symptoms consistent with COVID-19, or in those who have been in contact with the virus.  Negative results must be combined with clinical observations, patient history, and epidemiological information. The expected result is Negative.  Fact Sheet for Patients: https://www.jennings-kim.com/  Fact Sheet for Healthcare Providers: https://alexander-rogers.biz/  This test is not yet approved or cleared by the [place] FDA and  has been authorized for detection and/or diagnosis of SARS-CoV-2 by FDA under an Emergency Use Authorization (EUA).  This EUA will remain in effect (meaning this test can be used) for the duration of  the COV                          ID-19 declaration  under Section 564(b)(1) of  the Act, 21 U.S.C. section 360bbb-3(b)(1), unless the authorization is terminated or revoked sooner.     Cholesterol 07/25/2022 181  0 - 200 mg/dL Final   Triglycerides 16/07/9603 118  <150 mg/dL Final   HDL 54/06/8118 45  >40 mg/dL Final   Total CHOL/HDL Ratio 07/25/2022 4.0  RATIO Final   VLDL 07/25/2022 24  0 - 40 mg/dL Final   LDL Cholesterol 07/25/2022 112 (H)  0 - 99 mg/dL Final   Comment:        Total Cholesterol/HDL:CHD Risk Coronary Heart Disease Risk Table                     Men   Women  1/2 Average Risk   3.4   3.3  Average Risk       5.0   4.4  2 X Average Risk   9.6   7.1  3 X Average Risk  23.4   11.0        Use the calculated Patient Ratio above and the CHD Risk Table to determine the patient's CHD Risk.        ATP III CLASSIFICATION (LDL):  <100     mg/dL   Optimal  147-829  mg/dL   Near or Above                    Optimal  130-159  mg/dL   Borderline  562-130  mg/dL   High  >865     mg/dL   Very High Performed at [facility] [facility], [address], [place], [place] [address]    TSH 07/25/2022 6.668 (H)  0.350 - 4.500 uIU/mL Final   Comment: Performed by a 3rd Generation assay with a functional sensitivity of <=0.01 uIU/mL. Performed at [facility], [address], [place], [place] [address]    Glucose-Capillary 07/26/2022 104 (H)  70 - 99 mg/dL Final   Glucose reference range applies only to samples taken after fasting for at least 8 hours.   T3, Free 07/31/2022 2.3  2.0 - 4.4 pg/mL Final   Comment: (NOTE) Performed At: [facility] [address] [place], [place] [id number] Jolene Schimke MD UU:[id number]    Free T4 07/31/2022 0.60 (L)  0.61 - 1.12 ng/dL Final   Comment: (NOTE) Biotin ingestion may interfere with free T4 tests. If the results are inconsistent with the TSH level, previous test results, or the clinical presentation, then consider biotin interference. If needed, order repeat testing after stopping biotin. Performed at  [facility], [address] [place], [place] [address]    Glucose-Capillary 08/29/2022 100 (H)  70 - 99 mg/dL Final   Glucose reference range applies only to samples taken after fasting for at least 8 hours.   TSH 09/05/2022 0.793  0.350 - 4.500 uIU/mL Final   Comment: Performed by a 3rd Generation assay with a functional sensitivity of <=0.01 uIU/mL. Performed at [facility], [address], [place], [place] [address]    Free T4 09/05/2022 0.73  0.61 - 1.12 ng/dL Final   Comment: (NOTE) Biotin ingestion may interfere with free T4 tests. If the results are inconsistent with the TSH level, previous test results, or the clinical presentation, then consider biotin interference. If needed, order repeat testing after stopping biotin. Performed at [facility], [address], [place], [place] [address]    Preg Test, Ur 09/06/2022 Negative  Negative Final  Preg Test, Ur 09/06/2022 NEGATIVE  NEGATIVE Final   Comment:        THE SENSITIVITY OF THIS METHODOLOGY IS >24 mIU/mL    Preg Test, Ur 09/05/2022 NEGATIVE  NEGATIVE Final   Comment:        THE SENSITIVITY OF THIS METHODOLOGY IS >24 mIU/mL    Sodium 09/13/2022 136  135 - 145 mmol/L Final   Potassium 09/13/2022 4.5  3.5 - 5.1 mmol/L Final   Chloride 09/13/2022 105  98 - 111 mmol/L Final   CO2 09/13/2022 21 (L)  22 - 32 mmol/L Final   Glucose, Bld 09/13/2022 111 (H)  70 - 99 mg/dL Final   Glucose reference range applies only to samples taken after fasting for at least 8 hours.   BUN 09/13/2022 14  6 - 20 mg/dL Final   Creatinine, Ser 09/13/2022 0.92  0.44 - 1.00 mg/dL Final   Calcium 21/30/8657 9.1  8.9 - 10.3 mg/dL Final   GFR, Estimated 09/13/2022 >60  >60 mL/min Final   Comment: (NOTE) Calculated using the CKD-EPI Creatinine Equation (2021)    Anion gap 09/13/2022 10  5 - 15 Final   Performed at [facility], [address] [place], [place] [address]   Vitamin B-12 09/13/2022 585  180 - 914 pg/mL  Final   Comment: (NOTE) This assay is not validated for testing neonatal or myeloproliferative syndrome specimens for Vitamin B12 levels. Performed at [facility], [address] [place], [place] [address]    Color, Urine 09/14/2022 YELLOW  YELLOW Final   APPearance 09/14/2022 HAZY (A)  CLEAR Final   Specific Gravity, Urine 09/14/2022 1.025  1.005 - 1.030 Final   pH 09/14/2022 5.0  5.0 - 8.0 Final   Glucose, UA 09/14/2022 NEGATIVE  NEGATIVE mg/dL Final   Hgb urine dipstick 09/14/2022 NEGATIVE  NEGATIVE Final   Bilirubin Urine 09/14/2022 NEGATIVE  NEGATIVE Final   Ketones, ur 09/14/2022 NEGATIVE  NEGATIVE mg/dL Final   Protein, ur 41/32/4401 NEGATIVE  NEGATIVE mg/dL Final   Nitrite 02/72/5366 NEGATIVE  NEGATIVE Final   Leukocytes,Ua 09/14/2022 TRACE (A)  NEGATIVE Final   RBC / HPF 09/14/2022 0-5  0 - 5 RBC/hpf Final   WBC, UA 09/14/2022 0-5  0 - 5 WBC/hpf Final   Bacteria, UA 09/14/2022 RARE (A)  NONE SEEN Final   Squamous Epithelial / HPF 09/14/2022 0-5  0 - 5 Final   Mucus 09/14/2022 PRESENT   Final   Performed at [facility], [address] [place], [place] [address]   Glucose-Capillary 09/16/2022 105 (H)  70 - 99 mg/dL Final   Glucose reference range applies only to samples taken after fasting for at least 8 hours.   SARS Coronavirus 2 by RT PCR 09/30/2022 NEGATIVE  NEGATIVE Final   Comment: (NOTE) SARS-CoV-2 target nucleic acids are NOT DETECTED.  The SARS-CoV-2 RNA is generally detectable in upper respiratory specimens during the acute phase of infection. The lowest concentration of SARS-CoV-2 viral copies this assay can detect is 138 copies/mL. A negative result does not preclude SARS-Cov-2 infection and should not be used as the sole basis for treatment or other patient management decisions. A negative result may occur with  improper specimen collection/handling, submission of specimen other than nasopharyngeal swab, presence of viral mutation(s) within  the areas targeted by this assay, and inadequate number of viral copies(<138 copies/mL). A negative result must be combined with clinical observations, patient history, and epidemiological information. The expected result is  Negative.  Fact Sheet for Patients:  https://www.fda.gov/media/152166/download  Fact Sheet for Healthcare Providers:  SeriousBroker.it  This test is no                          t yet approved or cleared by the [place] FDA and  has been authorized for detection and/or diagnosis of SARS-CoV-2 by FDA under an Emergency Use Authorization (EUA). This EUA will remain  in effect (meaning this test can be used) for the duration of the COVID-19 declaration under Section 564(b)(1) of the Act, 21 U.S.C.section 360bbb-3(b)(1), unless the authorization is terminated  or revoked sooner.       Influenza A by PCR 09/30/2022 NEGATIVE  NEGATIVE Final   Influenza B by PCR 09/30/2022 NEGATIVE  NEGATIVE Final   Comment: (NOTE) The Xpert Xpress SARS-CoV-2/FLU/RSV plus assay is intended as an aid in the diagnosis of influenza from Nasopharyngeal swab specimens and should not be used as a sole basis for treatment. Nasal washings and aspirates are unacceptable for Xpert Xpress SARS-CoV-2/FLU/RSV testing.  Fact Sheet for Patients: BloggerCourse.com  Fact Sheet for Healthcare Providers: SeriousBroker.it  This test is not yet approved or cleared by the [place] FDA and has been authorized for detection and/or diagnosis of SARS-CoV-2 by FDA under an Emergency Use Authorization (EUA). This EUA will remain in effect (meaning this test can be used) for the duration of the COVID-19 declaration under Section 564(b)(1) of the Act, 21 U.S.C. section 360bbb-3(b)(1), unless the authorization is terminated or revoked.     Resp Syncytial Virus by PCR 09/30/2022 NEGATIVE  NEGATIVE Final   Comment:  (NOTE) Fact Sheet for Patients: BloggerCourse.com  Fact Sheet for Healthcare Providers: SeriousBroker.it  This test is not yet approved or cleared by the [place] FDA and has been authorized for detection and/or diagnosis of SARS-CoV-2 by FDA under an Emergency Use Authorization (EUA). This EUA will remain in effect (meaning this test can be used) for the duration of the COVID-19 declaration under Section 564(b)(1) of the Act, 21 U.S.C. section 360bbb-3(b)(1), unless the authorization is terminated or revoked.  Performed at [facility], [address], [place], [place] [address]    Sodium 10/01/2022 136  135 - 145 mmol/L Final   Potassium 10/01/2022 3.8  3.5 - 5.1 mmol/L Final   Chloride 10/01/2022 104  98 - 111 mmol/L Final   CO2 10/01/2022 27  22 - 32 mmol/L Final   Glucose, Bld 10/01/2022 98  70 - 99 mg/dL Final   Glucose reference range applies only to samples taken after fasting for at least 8 hours.   BUN 10/01/2022 12  6 - 20 mg/dL Final   Creatinine, Ser 10/01/2022 0.98  0.44 - 1.00 mg/dL Final   Calcium 29/56/2130 8.9  8.9 - 10.3 mg/dL Final   GFR, Estimated 10/01/2022 >60  >60 mL/min Final   Comment: (NOTE) Calculated using the CKD-EPI Creatinine Equation (2021)    Anion gap 10/01/2022 5  5 - 15 Final   Performed at [facility], [address], [place], [place] [address]   WBC 10/01/2022 5.1  4.0 - 10.5 K/uL Final   RBC 10/01/2022 4.31  3.87 - 5.11 MIL/uL Final   Hemoglobin 10/01/2022 12.7  12.0 - 15.0 g/dL Final   HCT 46/96/2952 38.8  36.0 - 46.0 % Final   MCV 10/01/2022 90.0  80.0 - 100.0 fL Final   MCH 10/01/2022 [email or web address]  26.0 - 34.0 pg  Final   MCHC 10/01/2022 32.7  30.0 - 36.0 g/dL Final   RDW 45/40/9811 12.4  11.5 - 15.5 % Final   Platelets 10/01/2022 300  150 - 400 K/uL Final   nRBC 10/01/2022 0.0  0.0 - 0.2 % Final   Performed at [facility], [address], [place], [place] [address]    Lipase 10/01/2022 29  11 - 51 U/L Final   Performed at [facility], [address], [place], [place] [address]   Color, Urine 10/05/2022 YELLOW  YELLOW Final   APPearance 10/05/2022 HAZY (A)  CLEAR Final   Specific Gravity, Urine 10/05/2022 1.018  1.005 - 1.030 Final   pH 10/05/2022 5.0  5.0 - 8.0 Final   Glucose, UA 10/05/2022 NEGATIVE  NEGATIVE mg/dL Final   Hgb urine dipstick 10/05/2022 NEGATIVE  NEGATIVE Final   Bilirubin Urine 10/05/2022 NEGATIVE  NEGATIVE Final   Ketones, ur 10/05/2022 NEGATIVE  NEGATIVE mg/dL Final   Protein, ur 13/05/6577 NEGATIVE  NEGATIVE mg/dL Final   Nitrite 46/96/2952 NEGATIVE  NEGATIVE Final   Leukocytes,Ua 10/05/2022 TRACE (A)  NEGATIVE Final   RBC / HPF 10/05/2022 0-5  0 - 5 RBC/hpf Final   WBC, UA 10/05/2022 0-5  0 - 5 WBC/hpf Final   Bacteria, UA 10/05/2022 RARE (A)  NONE SEEN Final   Squamous Epithelial / HPF 10/05/2022 0-5  0 - 5 Final   Mucus 10/05/2022 PRESENT   Final   Performed at [facility], [address] [place], [place] [address]   Specimen Description 10/05/2022 URINE, CLEAN CATCH   Final   Special Requests 10/05/2022    Final                   Value:NONE Performed at [facility], [address] [place], [place] [address]    Culture 10/05/2022 10,000 COLONIES/mL MULTIPLE SPECIES PRESENT, SUGGEST RECOLLECTION (A)   Final   Report Status 10/05/2022 10/07/2022 FINAL   Final    Allergies: Apple juice, Other, and Watermelon flavor  Medications:  Facility Ordered Medications  Medication   acetaminophen (TYLENOL) tablet 650 mg   alum & mag hydroxide-simeth (MAALOX/MYLANTA) 200-200-20 MG/5ML suspension 30 mL   magnesium hydroxide (MILK OF MAGNESIA) suspension 30 mL   hydrOXYzine (ATARAX) tablet 25 mg   traZODone (DESYREL) tablet 50 mg   levothyroxine (SYNTHROID) tablet 100 mcg   sertraline (ZOLOFT) tablet 150 mg   metFORMIN (GLUCOPHAGE) tablet 500 mg   QUEtiapine (SEROQUEL) tablet 400 mg   PTA Medications   Medication Sig   ABILIFY MAINTENA 400 MG PRSY prefilled syringe 400 mg every 28 (twenty-eight) days.   sertraline (ZOLOFT) 50 MG tablet Take 3 tablets (150 mg total) by mouth daily.   traZODone (DESYREL) 100 MG tablet Take 1 tablet (100 mg total) by mouth at bedtime.   QUEtiapine (SEROQUEL) 400 MG tablet Take 1 tablet (400 mg total) by mouth 2 (two) times daily.   Oxcarbazepine (TRILEPTAL) 300 MG tablet TAKE 1 TABLET(300 MG) BY MOUTH TWICE DAILY    Medical Decision Making  Recommend admission to the continuous observation overnight for safety monitoring and re-eval for SI/HI/AVH or paranoia in AM for final disposition.  Lab Orders         SARS Coronavirus 2 by RT PCR (hospital order, performed in [facility] hospital lab) *cepheid single result test* Anterior Nasal Swab         CBC with Differential/Platelet  Comprehensive metabolic panel         POC urine preg, ED         POCT Urine Drug Screen - (I-Screen)      Home medications reordered at this encounter -Synthroid 100 mcg p.o. daily for hypothyroidism -Metformin 500 mg p.o. daily with breakfast for type 2 diabetes -Seroquel 400 mg p.o. twice daily for mood stabilization -Sertraline 150 mg p.o. daily for depressive symptoms/anxiety  Other PRNs -Tylenol 650 mg p.o. every 6 hours as needed for pain -Maalox 30 mL p.o. every 4 hours as needed indigestion -Atarax 25 mg p.o. 3 times daily as needed anxiety -MOM 30 mL p.o. daily as needed constipation -Trazodone 50 mg p.o. nightly as needed insomnia   Recommendations  Based on my evaluation the patient does not appear to have an emergency medical condition.  Recommend admission to the continuous observation overnight for safety monitoring and re-eval for SI/HI/AVH or paranoia in AM for final disposition.  Mancel Bale, NP 01/30/23  12:05 AM

## 2023-01-30 ENCOUNTER — Ambulatory Visit (INDEPENDENT_AMBULATORY_CARE_PROVIDER_SITE_OTHER): Payer: Medicare HMO | Admitting: Psychiatry

## 2023-01-30 ENCOUNTER — Ambulatory Visit (INDEPENDENT_AMBULATORY_CARE_PROVIDER_SITE_OTHER): Payer: Medicare HMO | Admitting: *Deleted

## 2023-01-30 ENCOUNTER — Encounter (HOSPITAL_COMMUNITY): Payer: Self-pay

## 2023-01-30 ENCOUNTER — Other Ambulatory Visit: Payer: Self-pay

## 2023-01-30 ENCOUNTER — Encounter (HOSPITAL_COMMUNITY): Payer: Self-pay | Admitting: Psychiatry

## 2023-01-30 DIAGNOSIS — Z5901 Sheltered homelessness: Secondary | ICD-10-CM | POA: Diagnosis not present

## 2023-01-30 DIAGNOSIS — F411 Generalized anxiety disorder: Secondary | ICD-10-CM | POA: Diagnosis not present

## 2023-01-30 DIAGNOSIS — F5105 Insomnia due to other mental disorder: Secondary | ICD-10-CM | POA: Diagnosis not present

## 2023-01-30 DIAGNOSIS — F331 Major depressive disorder, recurrent, moderate: Secondary | ICD-10-CM

## 2023-01-30 DIAGNOSIS — Z79899 Other long term (current) drug therapy: Secondary | ICD-10-CM | POA: Diagnosis not present

## 2023-01-30 DIAGNOSIS — F209 Schizophrenia, unspecified: Secondary | ICD-10-CM

## 2023-01-30 DIAGNOSIS — F99 Mental disorder, not otherwise specified: Secondary | ICD-10-CM

## 2023-01-30 DIAGNOSIS — Z1152 Encounter for screening for COVID-19: Secondary | ICD-10-CM | POA: Diagnosis not present

## 2023-01-30 DIAGNOSIS — R45851 Suicidal ideations: Secondary | ICD-10-CM | POA: Diagnosis not present

## 2023-01-30 DIAGNOSIS — F32A Depression, unspecified: Secondary | ICD-10-CM | POA: Diagnosis not present

## 2023-01-30 LAB — POCT URINE DRUG SCREEN - MANUAL ENTRY (I-SCREEN)
POC Amphetamine UR: NOT DETECTED
POC Buprenorphine (BUP): NOT DETECTED
POC Cocaine UR: NOT DETECTED
POC Marijuana UR: NOT DETECTED
POC Methadone UR: NOT DETECTED
POC Methamphetamine UR: NOT DETECTED
POC Morphine: NOT DETECTED
POC Oxazepam (BZO): NOT DETECTED
POC Oxycodone UR: NOT DETECTED
POC Secobarbital (BAR): NOT DETECTED

## 2023-01-30 LAB — POC SARS CORONAVIRUS 2 AG: SARSCOV2ONAVIRUS 2 AG: NEGATIVE

## 2023-01-30 LAB — COMPREHENSIVE METABOLIC PANEL
ALT: 20 U/L (ref 0–44)
AST: 17 U/L (ref 15–41)
Albumin: 3.7 g/dL (ref 3.5–5.0)
Alkaline Phosphatase: 70 U/L (ref 38–126)
Anion gap: 11 (ref 5–15)
BUN: 12 mg/dL (ref 6–20)
CO2: 27 mmol/L (ref 22–32)
Calcium: 9.4 mg/dL (ref 8.9–10.3)
Chloride: 101 mmol/L (ref 98–111)
Creatinine, Ser: 0.92 mg/dL (ref 0.44–1.00)
GFR, Estimated: >60 mL/min (ref >60)
Glucose, Bld: 75 mg/dL (ref 70–99)
Potassium: 3.6 mmol/L (ref 3.5–5.1)
Sodium: 139 mmol/L (ref 135–145)
Total Bilirubin: 0.3 mg/dL (ref 0.3–1.2)
Total Protein: 7.3 g/dL (ref 6.5–8.1)

## 2023-01-30 LAB — CBC WITH DIFFERENTIAL/PLATELET
Abs Immature Granulocytes: 0.02 10*3/uL (ref 0.00–0.07)
Basophils Absolute: 0 10*3/uL (ref 0.0–0.1)
Basophils Relative: 0 %
Eosinophils Absolute: 0 10*3/uL (ref 0.0–0.5)
Eosinophils Relative: 0 %
HCT: 39.4 % (ref 36.0–46.0)
Hemoglobin: 12.8 g/dL (ref 12.0–15.0)
Immature Granulocytes: 0 %
Lymphocytes Relative: 56 %
Lymphs Abs: 4.4 10*3/uL — ABNORMAL HIGH (ref 0.7–4.0)
MCH: 29 pg (ref 26.0–34.0)
MCHC: 32.5 g/dL (ref 30.0–36.0)
MCV: 89.1 fL (ref 80.0–100.0)
Monocytes Absolute: 0.5 10*3/uL (ref 0.1–1.0)
Monocytes Relative: 6 %
Neutro Abs: 3 10*3/uL (ref 1.7–7.7)
Neutrophils Relative %: 38 %
Platelets: 286 10*3/uL (ref 150–400)
RBC: 4.42 MIL/uL (ref 3.87–5.11)
RDW: 14.1 % (ref 11.5–15.5)
WBC: 7.9 10*3/uL (ref 4.0–10.5)
nRBC: 0 % (ref 0.0–0.2)

## 2023-01-30 LAB — SARS CORONAVIRUS 2 BY RT PCR: SARS Coronavirus 2 by RT PCR: NEGATIVE

## 2023-01-30 LAB — POC URINE PREG, ED: Preg Test, Ur: NEGATIVE

## 2023-01-30 MED ORDER — TRAZODONE HCL 100 MG PO TABS: 100.0000 mg | ORAL_TABLET | Freq: Every day | ORAL | 3 refills | Status: DC

## 2023-01-30 MED ORDER — QUETIAPINE FUMARATE 400 MG PO TABS: 400.0000 mg | ORAL_TABLET | Freq: Two times a day (BID) | ORAL | 1 refills | Status: DC

## 2023-01-30 MED ORDER — QUETIAPINE FUMARATE 400 MG PO TABS
400.0000 mg | ORAL_TABLET | Freq: Two times a day (BID) | ORAL | Status: DC
  Administered 2023-01-30 (×2): 400 mg via ORAL
  Filled 2023-01-30 (×2): qty 1

## 2023-01-30 MED ORDER — ABILIFY MAINTENA 400 MG IM PRSY: 400.0000 mg | PREFILLED_SYRINGE | INTRAMUSCULAR | 11 refills | Status: DC

## 2023-01-30 MED ORDER — ARIPIPRAZOLE ER 400 MG IM PRSY
400.0000 mg | PREFILLED_SYRINGE | Freq: Once | INTRAMUSCULAR | Status: AC
  Administered 2023-01-30: 400 mg via INTRAMUSCULAR

## 2023-01-30 MED ORDER — OXCARBAZEPINE 300 MG PO TABS: 300.0000 mg | ORAL_TABLET | Freq: Two times a day (BID) | ORAL | 3 refills | Status: DC

## 2023-01-30 MED ORDER — SERTRALINE HCL 100 MG PO TABS: 200.0000 mg | ORAL_TABLET | Freq: Every day | ORAL | 3 refills | Status: DC

## 2023-01-30 NOTE — Progress Notes (Signed)
Patient arrived for injection of Abilify maintena  given in Right Deltoid without issues or complaints. No SI/HI or  AV hallucinations.Pleasant and cooperative.
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
## 2023-01-30 NOTE — ED Notes (Signed)
Patient discharged to the lobby. AVS given and reviewed with the patient. Understanding verbalized. All belongings returned.

## 2023-01-30 NOTE — Progress Notes (Signed)
   01/29/23 2350  [facility] Triage Screening (Walk-ins at [facility] only)  How Did You Hear About [place]? Self  What Is the Reason for Your Visit/Call Today? Pt has a diagnosis of schizophrenia and major depressive disorder. She says her aunt was recently diagnosed with stage 4 cancer and she is overwhelmed with emotions. She states she is currently suicidal with a plan to overdose on her prescription medications. She says she hears voices telling her "Nobody care about you" and "I should just die." She denies homicidal ideation. She denies use of alcohol or other substances. She says she lives alone in transitional housing. She called 911 and told law enforcement she did not feel safe and needed to go to a mental health facility.  How Long Has This Been Causing You Problems? <Week  Have You Recently Had Any Thoughts About Hurting Yourself? Yes  How long ago did you have thoughts about hurting yourself? Current suicidal ideation  Are You Planning to Commit Suicide/Harm Yourself At This time? Yes (Plan to ovedose on medications)  Have you Recently Had Thoughts About Hurting Someone [name]? No  Are You Planning To Harm Someone At This Time? No  Are you currently experiencing any auditory, visual or other hallucinations? Yes  Please explain the hallucinations you are currently experiencing: Pt reports hearing voices telling her to kill herself  Have You Used Any Alcohol or Drugs in the Past 24 Hours? No  Do you have any current medical co-morbidities that require immediate attention? No  Clinician description of patient physical appearance/behavior: Pt is neatly dressed and well-groomed. She is alert and oriented x4. Pt speaks in a clear tone, at moderate volume and normal pace. Motor behavior appears normal. Eye contact is fair. Pt's mood is depressed and affect is congruent with mood. Thought process is coherent and relevant. There is no indication from Pt's behavior that she is responding to internal stimuli. She is  cooperative.  What Do You Feel Would Help You the Most Today? Treatment for Depression or other mood problem;Medication(s)  If access to [facility] Urgent [facility] was not available, would you have sought care in the Emergency Department? Yes  Determination of Need Urgent (48 hours)  Options For Referral [facility] Urgent Care;Inpatient Hospitalization;Medication Management;Outpatient Therapy

## 2023-01-30 NOTE — Progress Notes (Signed)
BH MD/PA/NP OP Progress Note  01/30/2023 10:40 AM Nicole [name]  MRN:  [id number]  Chief Complaint: "I need my injection and I have been depressed"  HPI: 33 year old female seen today for follow-up psychiatric evaluation.  She has a psychiatric history of schizophrenia, depression, anxiety, insomnia, tobacco use, and borderline intellectual functioning.  Currently she is managed on Abilify 400 mg monthly, Trileptal 300 mg twice daily, Zoloft 150 mg daily, trazodone 100 mg nightly as needed, and Trileptal 300 mg 3 times daily.  Patient last received her Abilify injection in February.  She missed her March dose and reports that she would like to restart it.  Today she is well-groomed, pleasant, cooperative, and engaged in conversation.  Patient speech is slow and the voice is decreased.  Her affect is flat.  She informed Clinical research associate that she is in need of her injection as she has been depressed and more suicidal.  She reports that this morning her aunt who resides in [place] passed away of cancer.  She notes that she wished that she could die so that her aunt could live.  She denies having a plan to harm herself and notes that she is grieving.  She does Archivist that she speaks to a counselor regularly.  Today provider conducted GAD-7 and patient scored a 21.  She notes that she worries about [name] her 94-year-old daughter who resides in [place] with the patient's aunt and uncle.  Provider also conducted PHQ-9 the patient scored a 16.  She notes that she sleeps approximately 6 hours nightly.  Patient endorses having an increased appetite.  She also informed Clinical research associate that she has gained weight since stopping Ozempic over the last 6 months.  Today she denies SI/HI/VAH, mania, paranoia.  Today patient is agreeable to increasing Zoloft 150 mg to 200 mg to help manage anxiety and depression.  Patient also restarted on Abilify Maintena 400 mg monthly injection.  She will continue all other medications as  prescribed.  No other concerns noted at this time.  Visit Diagnosis:    ICD-10-CM   1. Schizophrenia, unspecified type  F20.9 ABILIFY MAINTENA 400 MG PRSY prefilled syringe    Oxcarbazepine (TRILEPTAL) 300 MG tablet    QUEtiapine (SEROQUEL) 400 MG tablet    2. Anxiety state  F41.1 sertraline (ZOLOFT) 100 MG tablet    3. MDD (major depressive disorder), recurrent episode, moderate  F33.1 sertraline (ZOLOFT) 100 MG tablet    4. Insomnia due to other mental disorder  F51.05 traZODone (DESYREL) 100 MG tablet   F99       Past Psychiatric History: Schizophrenia (unspecified type), Major depressive disorder, Patient reports that she has been hospitalized due to mental health multiple times.  Patient states that she has done stints at [facility] and [facility]  Past Medical History:  Past Medical History:  Diagnosis Date   Anxiety    Depression    Hypothyroidism 08/07/2022   Tobacco use disorder 08/07/2022    Past Surgical History:  Procedure Laterality Date   WISDOM TOOTH EXTRACTION Bilateral 2020    Family Psychiatric History: mother attempted suicide, Patient reports that her brother used to abuse marijuana and alcohol. Patient states that her mother used to abuse marijuana and alcohol.   Family History:  Family History  Problem Relation Age of Onset   Hypertension Father    Diabetes Father     Social History:  Social History   Socioeconomic History   Marital status: Single    Spouse name: Not on  file   Number of children: Not on file   Years of education: Not on file   Highest education level: Not on file  Occupational History   Not on file  Tobacco Use   Smoking status: Every Day    Types: Cigarettes   Smokeless tobacco: Never  Vaping Use   Vaping Use: Some days  Substance and Sexual Activity   Alcohol use: Never   Drug use: Never   Sexual activity: Yes    Partners: Female    Birth control/protection: Implant  Other Topics Concern   Not on file  Social  History Narrative   Not on file   Social Determinants of Health   Financial Resource Strain: Low Risk  (12/13/2022)   Overall Financial Resource Strain (CARDIA)    Difficulty of Paying Living Expenses: Not hard at all  Food Insecurity: No Food Insecurity (12/13/2022)   Hunger Vital Sign    Worried About Running Out of Food in the Last Year: Never true    Ran Out of Food in the Last Year: Never true  Transportation Needs: Unmet Transportation Needs (12/13/2022)   PRAPARE - Transportation    Lack of Transportation (Medical): Yes    Lack of Transportation (Non-Medical): Yes  Physical Activity: Inactive (12/13/2022)   Exercise Vital Sign    Days of Exercise per Week: 0 days    Minutes of Exercise per Session: 0 min  Stress: Stress Concern Present (12/13/2022)   [employer] of Occupational Health - Occupational Stress Questionnaire    Feeling of Stress : Very much  Social Connections: Socially Integrated (12/13/2022)   Social Connection and Isolation Panel [NHANES]    Frequency of Communication with Friends and Family: More than three times a week    Frequency of Social Gatherings with Friends and Family: Three times a week    Attends Religious Services: More than 4 times per year    Active Member of Clubs or Organizations: Yes    Attends Banker Meetings: More than 4 times per year    Marital Status: Living with partner    Allergies:  Allergies  Allergen Reactions   Apple Juice Anaphylaxis   Other Anaphylaxis and Other (See Comments)    SALMON   Watermelon Flavor Anaphylaxis    Metabolic Disorder Labs: Lab Results  Component Value Date   HGBA1C 5.7 (H) 10/15/2022   MPG 117 10/15/2022   MPG 96.8 06/07/2022   No results found for: "PROLACTIN" Lab Results  Component Value Date   CHOL 181 07/25/2022   TRIG 118 07/25/2022   HDL 45 07/25/2022   CHOLHDL 4.0 07/25/2022   VLDL 24 07/25/2022   LDLCALC 112 (H) 07/25/2022   LDLCALC 88 06/07/2022   Lab Results   Component Value Date   TSH 0.793 09/05/2022   TSH 6.668 (H) 07/25/2022    Therapeutic Level Labs: No results found for: "LITHIUM" No results found for: "VALPROATE" No results found for: "CBMZ"  Current Medications: Current Outpatient Medications  Medication Sig Dispense Refill   ABILIFY MAINTENA 400 MG PRSY prefilled syringe Inject 400 mg into the muscle every 28 (twenty-eight) days. 1 each 11   Alcohol Swabs (DROPSAFE ALCOHOL PREP) 70 % PADS Apply topically.     fluticasone (FLONASE) 50 MCG/ACT nasal spray Place 1 spray into both nostrils daily.     levothyroxine (SYNTHROID) 100 MCG tablet Take 1 tablet (100 mcg total) by mouth daily at 6 (six) AM. 30 tablet 0   metFORMIN (GLUCOPHAGE) 500 MG  tablet Take 1 tablet (500 mg total) by mouth daily with breakfast. 30 tablet 0   Oxcarbazepine (TRILEPTAL) 300 MG tablet Take 1 tablet (300 mg total) by mouth 2 (two) times daily. TAKE 1 TABLET(300 MG) BY MOUTH TWICE DAILY Strength: 300 mg 60 tablet 3   pantoprazole (PROTONIX) 40 MG tablet Take 1 tablet (40 mg total) by mouth daily. 30 tablet 0   QUEtiapine (SEROQUEL) 400 MG tablet Take 1 tablet (400 mg total) by mouth 2 (two) times daily. 60 tablet 1   sertraline (ZOLOFT) 100 MG tablet Take 2 tablets (200 mg total) by mouth daily. 60 tablet 3   traZODone (DESYREL) 100 MG tablet Take 1 tablet (100 mg total) by mouth at bedtime. 30 tablet 3   TRUE METRIX BLOOD GLUCOSE TEST test strip 1 each by Other route as needed (For blood sugar testing).     TRUEplus Lancets 33G MISC      valACYclovir (VALTREX) 500 MG tablet Take 500 mg by mouth daily.     No current facility-administered medications for this visit.     Musculoskeletal: Strength & Muscle Tone: within normal limits Gait & Station: normal Patient leans: N/A  Psychiatric Specialty Exam: Review of Systems  Blood pressure 121/81, pulse 95, resp. rate 18, height  (1.549 m), weight 220 lb 9.6 oz (100.1 kg), SpO2 100 %.Body mass index is  41.68 kg/m.  General Appearance: Well Groomed  Eye Contact:  Good  Speech:  Clear and Coherent and Slow  Volume:  Normal  Mood:  Anxious and Depressed  Affect:  Depressed and Flat  Thought Process:  Coherent, Goal Directed, and Linear  Orientation:  Full (Time, Place, and Person)  Thought Content: WDL and Logical   Suicidal Thoughts:  No  Homicidal Thoughts:  No  Memory:  Immediate;   Good Recent;   Good Remote;   Good  Judgement:  Good  Insight:  Good  Psychomotor Activity:  Normal  Concentration:  Concentration: Good and Attention Span: Good  Recall:  Good  Fund of Knowledge: Good  Language: Good  Akathisia:  No  Handed:  Right  AIMS (if indicated): not done  Assets:  Communication Skills Desire for Improvement Housing Leisure Time Physical Health Social Support  ADL's:  Intact  Cognition: WNL  Sleep:  Fair   Screenings: AIMS    Flowsheet Row Office Visit from 01/30/2023 in [facility]  AIMS Total Score 2      GAD-7    Flowsheet Row Office Visit from 01/30/2023 in [facility] Office Visit from 12/04/2022 in [facility] Office Visit from 12/17/2021 in [facility] [place]  Total GAD-7 Score PHQ2-9    Flowsheet Row Office Visit from 01/30/2023 in [facility] Office Visit from 12/04/2022 in [facility] ED from 10/07/2022 in [facility] ED from 06/07/2022 in [facility] Office Visit from 12/17/2021 in [facility] Total Score 3 4 0 1 2  PHQ-9 Total Score 16 20 -- 2 8      Flowsheet Row Office Visit from 01/30/2023 in [facility] ED from 01/29/2023 in [facility] Office Visit from 12/04/2022 in [facility]  C-SSRS RISK CATEGORY Error: Q7 should not be populated when Q6  is No High Risk Low Risk        Assessment and Plan: Patient endorses symptoms of anxiety and depression.  Today Zoloft increased from 150 mg to 200 mg to help manage anxiety and depression.  She missed her last Abilify injection and received it today.  She will continue all other medications as prescribed.  1. Schizophrenia, unspecified type  Continue- ABILIFY MAINTENA 400 MG PRSY prefilled syringe; Inject 400 mg into the muscle every 28 (twenty-eight) days.  Dispense: 1 each; Refill: 11 Continue- Oxcarbazepine (TRILEPTAL) 300 MG tablet; Take 1 tablet (300 mg total) by mouth 2 (two) times daily. TAKE 1 TABLET(300 MG) BY MOUTH TWICE DAILY Strength: 300 mg  Dispense: 60 tablet; Refill: 3 Continue- QUEtiapine (SEROQUEL) 400 MG tablet; Take 1 tablet (400 mg total) by mouth 2 (two) times daily.  Dispense: 60 tablet; Refill: 1  2. Anxiety state  Increased- sertraline (ZOLOFT) 100 MG tablet; Take 2 tablets (200 mg total) by mouth daily.  Dispense: 60 tablet; Refill: 3  3. MDD (major depressive disorder), recurrent episode, moderate  Increased- sertraline (ZOLOFT) 100 MG tablet; Take 2 tablets (200 mg total) by mouth daily.  Dispense: 60 tablet; Refill: 3  4. Insomnia due to other mental disorder  Continue- traZODone (DESYREL) 100 MG tablet; Take 1 tablet (100 mg total) by mouth at bedtime.  Dispense: 30 tablet; Refill: 3   Collaboration of Care: Collaboration of Care: Other provider involved in patient's care AEB counselor, PCP, shock clinic staff  Patient/Guardian was advised Release of Information must be obtained prior to any record release in order to collaborate their care with an outside provider. Patient/Guardian was advised if they have not already done so to contact the registration department to sign all necessary forms in order for [place] to release information regarding their care.   Consent: Patient/Guardian  gives verbal consent for treatment and assignment of benefits for services provided during this visit. Patient/Guardian expressed understanding and agreed to proceed.   Follow-up in 2-1/2 months Follow-up with shot clinic in 1 month Shanna Cisco, NP 01/30/2023, 10:40 AM

## 2023-01-30 NOTE — Discharge Instructions (Addendum)

## 2023-01-30 NOTE — ED Notes (Signed)
Pt sleeping at present, no distress noted.  Monitoring for safety. 

## 2023-01-30 NOTE — ED Provider Notes (Signed)
FBC/OBS ASAP Discharge Summary  Date and Time: 01/30/2023 8:52 AM  Name: Nicole [name]  MRN:  [id number]   Discharge Diagnoses:  Final diagnoses:  Suicidal ideation    Subjective: Patient states "I am ready to go upstairs, I have an appointment this morning to get my injection."  Nicole [name] is reassessed by this nurse practitioner face-to-face.  She is reclined in observation area upon my approach, appears asleep.  She is alert and oriented, pleasant and cooperative during assessment.  She presents with euthymic mood, congruent affect.  Chart reviewed and patient discussed with Dr. Nelly [name] on 01/30/2023.  Patient is scheduled to receive long-acting injectable, Abilify Maintena, at [facility] behavioral health outpatient this morning.  She also has an upcoming individual counseling appointment at [facility] behavioral health outpatient.  Nicole [name]'s mental health history includes major depressive disorder, schizophrenia, anxiety and borderline intellectual functioning.  She endorses history of multiple previous inpatient psychiatric hospitalizations.  No family mental health or addiction history reported.  Patient denies suicidal and homicidal ideations.  She denies auditory and visual hallucinations currently.  There is no evidence of delusional thought content and no indication that patient is responding to internal stimuli.  Patient residing in transitional housing.  She denies access to weapons.  She receives disability income.  She endorses average sleep and appetite and denies alcohol and substance use.  Patient offered support and encouragement.  She gives verbal consent to speak with her father/legal guardian, Nicole [name].  Spoke with legal guardian, Nicole [name], phone number [phone].  Patient's father denies safety concerns and agrees with plan for return to transitional housing.  Per patient's father patient has a supportive relationship with Production designer, theatre/television/film of transitional  housing program [place].  Patient's father states "she has a habit of crying out for attention at times."   Patient and family are educated and verbalize understanding of mental health resources and other crisis services in the community. They are instructed to call 911 and present to the nearest emergency room should patient experience any suicidal/homicidal ideation, auditory/visual/hallucinations, or detrimental worsening of mental health condition.       Stay Summary:  01/29/2023- 2348pm HPI: Nicole [name] is a 32 y.o. female with psychiatric history of schizophrenia unspecified, MDD with psychosis, who presented voluntarily to [facility] via [facility] after she called and reported feeling like overdosing on her sleeping pills and not feeling safe with herself.   Patient was seen face-to-face by this provider and chart reviewed.. Patient is well-known to the behavioral health service line, and has had 5 ED visits in the past 6 months.  Patient was recently seen here at the [facility]  12/04/2022 with complaints of insomnia, anxiety and  request for medication refill. Patient was also discharged from the [facility] 11/23/22 where she has remained a boarder for four months after she presented on (07/23/2022) via [employer] after a verbal altercation with Nicole [name] (not patient's legal guardian) for transient SI, which was patient's fourth visit to [facility] for similar concerns last year. Patient has been dismissed from previous group home due to threatening SI and HI, then leaving the group home and remained in continuous assessment unit as a boarder until her discharge to transitional housing 11/23/22 with F/U outpatient appts at the [facility] for medication magt and counseling.  Patient is currently followed by [facility] outpatient psychiatric services/walk-in clinic for medication management, and reports she is prescribed  Sertraline, Trazodone, Abilify Maintena injection, and other psychiatric medications she is unable to  recall their names.  Patient reports she was seen today by a representative of [facility] for an assessment and is in the process of starting therapy soon.    On tonight's visit, patient reports "I found out my aunt has stage IV cancer and I feel like overdosing on my sleeping pills and I've had the feeling throughout the day, and I'm also hearing a voice telling me nobody cares and I should just die".    Patient reports her sleep is poor and appetite is fair.  Patient reports she lives a transitional house with a roommate.  Patient reports she is compliant with her medications and recently received a shot of her Abilify Maintena injection, but is unsure of the date.  Patient denies access to guns or weapons.   Patient reports she is still suicidal, feels unsafe with herself, and is unable to contract for safety at this time.    Support, encouragement and reassurance provided about ongoing stressors. Patient is provided with opportunity for questions.    On evaluation, patient is alert, oriented x 3, calm and cooperative. Speech is clear and coherent. Pt appears well groomed. Eye contact is good. Mood is anxious and depressed, affect is congruent with mood. Thought process is coherent and thought content is WDL. Pt endorses SI with plan, endorses AH, denies HI/VH or paranoia. There is objective indication that the patient is responding to internal stimuli. As she constantly rocks her body back and forth throughout this evaluation. No delusions elicited during this assessment.     Discussed admission to the continuous observation unit overnight for safety monitoring and re-eval for SI/HI/AVH in am, patient is in agreement.     Total Time spent with patient: 30 minutes  Past Psychiatric History: see above Past Medical History: see above Family History: none reported Family Psychiatric History: none reported Social History: transitional housing, legal guardian Tobacco Cessation:   N/A, patient does not currently use tobacco products  Current Medications:  Current Facility-Administered Medications  Medication Dose Route Frequency Provider Last Rate Last Admin   acetaminophen (TYLENOL) tablet 650 mg  650 mg Oral Q6H PRN Onuoha, Chinwendu V, NP       alum & mag hydroxide-simeth (MAALOX/MYLANTA) 200-200-20 MG/5ML suspension 30 mL  30 mL Oral Q4H PRN Onuoha, Chinwendu V, NP       hydrOXYzine (ATARAX) tablet 25 mg  25 mg Oral TID PRN Onuoha, Chinwendu V, NP   25 mg at 01/30/23 0050   levothyroxine (SYNTHROID) tablet 100 mcg  100 mcg Oral Q0600 Onuoha, Chinwendu V, NP   100 mcg at 01/30/23 0600   magnesium hydroxide (MILK OF MAGNESIA) suspension 30 mL  30 mL Oral Daily PRN Onuoha, Chinwendu V, NP       metFORMIN (GLUCOPHAGE) tablet 500 mg  500 mg Oral Q breakfast Onuoha, Chinwendu V, NP       QUEtiapine (SEROQUEL) tablet 400 mg  400 mg Oral BID Onuoha, Chinwendu V, NP   400 mg at 01/30/23 0050   sertraline (ZOLOFT) tablet 150 mg  150 mg Oral Daily Onuoha, Chinwendu V, NP       traZODone (DESYREL) tablet 50 mg  50 mg Oral QHS PRN Onuoha, Chinwendu V, NP   50 mg at 01/30/23 0050   Current Outpatient Medications  Medication Sig Dispense Refill   ABILIFY MAINTENA 400 MG PRSY prefilled syringe Inject 400 mg into the muscle every 28 (twenty-eight) days.     Alcohol Swabs (DROPSAFE ALCOHOL PREP) 70 % PADS Apply topically.  fluticasone (FLONASE) 50 MCG/ACT nasal spray Place 1 spray into both nostrils daily.     levothyroxine (SYNTHROID) 100 MCG tablet Take 1 tablet (100 mcg total) by mouth daily at 6 (six) AM. 30 tablet 0   metFORMIN (GLUCOPHAGE) 500 MG tablet Take 1 tablet (500 mg total) by mouth daily with breakfast. 30 tablet 0   Oxcarbazepine (TRILEPTAL) 300 MG tablet TAKE 1 TABLET(300 MG) BY MOUTH TWICE DAILY (Patient taking differently: Take 300 mg by mouth 2 (two) times daily.) 180 tablet 1   pantoprazole (PROTONIX) 40 MG tablet Take 1 tablet (40 mg total) by mouth daily. 30  tablet 0   QUEtiapine (SEROQUEL) 400 MG tablet Take 1 tablet (400 mg total) by mouth 2 (two) times daily. 60 tablet 1   traZODone (DESYREL) 100 MG tablet Take 1 tablet (100 mg total) by mouth at bedtime. 30 tablet 1   TRUE METRIX BLOOD GLUCOSE TEST test strip 1 each by Other route as needed (For blood sugar testing).     TRUEplus Lancets 33G MISC      valACYclovir (VALTREX) 500 MG tablet Take 500 mg by mouth daily.     sertraline (ZOLOFT) 50 MG tablet Take 3 tablets (150 mg total) by mouth daily. 90 tablet 1    PTA Medications:  Facility Ordered Medications  Medication   acetaminophen (TYLENOL) tablet 650 mg   alum & mag hydroxide-simeth (MAALOX/MYLANTA) 200-200-20 MG/5ML suspension 30 mL   magnesium hydroxide (MILK OF MAGNESIA) suspension 30 mL   hydrOXYzine (ATARAX) tablet 25 mg   traZODone (DESYREL) tablet 50 mg   levothyroxine (SYNTHROID) tablet 100 mcg   sertraline (ZOLOFT) tablet 150 mg   metFORMIN (GLUCOPHAGE) tablet 500 mg   QUEtiapine (SEROQUEL) tablet 400 mg   PTA Medications  Medication Sig   ABILIFY MAINTENA 400 MG PRSY prefilled syringe Inject 400 mg into the muscle every 28 (twenty-eight) days.   levothyroxine (SYNTHROID) 100 MCG tablet Take 1 tablet (100 mcg total) by mouth daily at 6 (six) AM.   metFORMIN (GLUCOPHAGE) 500 MG tablet Take 1 tablet (500 mg total) by mouth daily with breakfast.   pantoprazole (PROTONIX) 40 MG tablet Take 1 tablet (40 mg total) by mouth daily.   traZODone (DESYREL) 100 MG tablet Take 1 tablet (100 mg total) by mouth at bedtime.   QUEtiapine (SEROQUEL) 400 MG tablet Take 1 tablet (400 mg total) by mouth 2 (two) times daily.   Oxcarbazepine (TRILEPTAL) 300 MG tablet TAKE 1 TABLET(300 MG) BY MOUTH TWICE DAILY (Patient taking differently: Take 300 mg by mouth 2 (two) times daily.)   Alcohol Swabs (DROPSAFE ALCOHOL PREP) 70 % PADS Apply topically.   TRUE METRIX BLOOD GLUCOSE TEST test strip 1 each by Other route as needed (For blood sugar  testing).   TRUEplus Lancets 33G MISC    fluticasone (FLONASE) 50 MCG/ACT nasal spray Place 1 spray into both nostrils daily.   valACYclovir (VALTREX) 500 MG tablet Take 500 mg by mouth daily.   sertraline (ZOLOFT) 50 MG tablet Take 3 tablets (150 mg total) by mouth daily.       12/04/2022    9:29 AM 10/28/2022    3:57 PM 10/26/2022   10:49 AM  Depression screen PHQ 2/9  Decreased Interest 2 0 0  Down, Depressed, Hopeless 2 0 0  PHQ - 2 Score 4 0 0  Altered sleeping 3    Tired, decreased energy 2    Change in appetite 2    Feeling bad or failure about  yourself  2    Trouble concentrating 3    Moving slowly or fidgety/restless 2    Suicidal thoughts 2    PHQ-9 Score 20    Difficult doing work/chores Extremely dIfficult      Flowsheet Row ED from 01/29/2023 in [facility] Office Visit from 12/04/2022 in [facility] ED from 10/07/2022 in [facility]  C-SSRS RISK CATEGORY High Risk Low Risk No Risk       Musculoskeletal  Strength & Muscle Tone: within normal limits Gait & Station: normal Patient leans: N/A  Psychiatric Specialty Exam  Presentation  General Appearance:  Appropriate for Environment; Casual  Eye Contact: Good  Speech: Clear and Coherent; Normal Rate  Speech Volume: Normal  Handedness: Right   Mood and Affect  Mood: Euthymic  Affect: Appropriate; Congruent   Thought Process  Thought Processes: Coherent; Goal Directed; Linear  Descriptions of Associations:Intact  Orientation:Full (Time, Place and Person)  Thought Content:Logical; WDL  Diagnosis of Schizophrenia or Schizoaffective disorder in past: Yes    Hallucinations:Hallucinations: None Description of Auditory Hallucinations: Patient reports hearing voices  telling her nobody cares and she should just go and die Description of Visual Hallucinations: denies  Ideas of Reference:None  Suicidal  Thoughts:Suicidal Thoughts: No SI Active Intent and/or Plan: With Plan  Homicidal Thoughts:Homicidal Thoughts: No   Sensorium  Memory: Immediate Good; Recent Fair  Judgment: Intact  Insight: Fair   Art therapist  Concentration: Good  Attention Span: Good  Recall: Good  Fund of Knowledge: Good  Language: Good   Psychomotor Activity  Psychomotor Activity: Psychomotor Activity: Normal   Assets  Assets: Communication Skills; Desire for Improvement; Financial Resources/Insurance; Housing; Physical Health; Resilience; Social Support   Sleep  Sleep: Sleep: Fair   Nutritional Assessment (For OBS and FBC admissions only) Has the patient had a weight loss or gain of 10 pounds or more in the last 3 months?: No Has the patient had a decrease in food intake/or appetite?: No Does the patient have dental problems?: No Does the patient have eating habits or behaviors that may be indicators of an eating disorder including binging or inducing vomiting?: No Has the patient recently lost weight without trying?: 0 Has the patient been eating poorly because of a decreased appetite?: 0 Malnutrition Screening Tool Score: 0    Physical Exam  Physical Exam Vitals and nursing note reviewed.  Constitutional:      Appearance: Normal appearance. She is well-developed.  HENT:     Head: Normocephalic and atraumatic.     Nose: Nose normal.  Cardiovascular:     Rate and Rhythm: Normal rate.  Pulmonary:     Effort: Pulmonary effort is normal.  Musculoskeletal:        General: Normal range of motion.     Cervical back: Normal range of motion.  Skin:    General: Skin is warm and dry.  Neurological:     Mental Status: She is alert and oriented to person, place, and time.  Psychiatric:        Attention and Perception: Attention and perception normal.        Mood and Affect: Mood and affect normal.        Speech: Speech normal.        Behavior: Behavior normal.  Behavior is cooperative.        Thought Content: Thought content normal.        Cognition and Memory: Cognition and memory  normal.    Review of Systems  Constitutional: Negative.   HENT: Negative.    Eyes: Negative.   Respiratory: Negative.    Cardiovascular: Negative.   Gastrointestinal: Negative.   Genitourinary: Negative.   Musculoskeletal: Negative.   Skin: Negative.   Neurological: Negative.   Psychiatric/Behavioral: Negative.     Blood pressure (!) 108/53, pulse 82, temperature 97.8 F (36.6 C), resp. rate 20, SpO2 100 %. There is no height or weight on file to calculate BMI.  Demographic Factors:  Unemployed  Loss Factors: NA  Historical Factors: Impulsivity  Risk Reduction Factors:   Sense of responsibility to family, Living with another person, especially a relative, Positive social support, Positive therapeutic relationship, and Positive coping skills or problem solving skills  Continued Clinical Symptoms:  Previous Psychiatric Diagnoses and Treatments  Cognitive Features That Contribute To Risk:  None    Suicide Risk:  Minimal: No identifiable suicidal ideation.  Patients presenting with no risk factors but with morbid ruminations; may be classified as minimal risk based on the severity of the depressive symptoms  Plan Of Care/Follow-up recommendations:  Follow-up with established outpatient psychiatry at [facility] behavioral health for medication management and individual counseling.  Appointment scheduled for later this morning.  Continue current medications/medications as directed by outpatient prescriber.   Disposition: Discharge  Lenard Lance, FNP 01/30/2023, 8:52 AM

## 2023-01-30 NOTE — ED Notes (Addendum)
Pt A&O x 4, presents with suicidal ideations, plan to overdose on meds. Pt reports hearing voices saying no one cares about you and she should just die.  Denies HI.  Monitoring for safety, calm & cooperative.

## 2023-02-05 DIAGNOSIS — F17210 Nicotine dependence, cigarettes, uncomplicated: Secondary | ICD-10-CM | POA: Diagnosis not present

## 2023-02-05 DIAGNOSIS — Z23 Encounter for immunization: Secondary | ICD-10-CM | POA: Diagnosis not present

## 2023-02-05 DIAGNOSIS — F3340 Major depressive disorder, recurrent, in remission, unspecified: Secondary | ICD-10-CM | POA: Diagnosis not present

## 2023-02-05 DIAGNOSIS — Z3046 Encounter for surveillance of implantable subdermal contraceptive: Secondary | ICD-10-CM | POA: Diagnosis not present

## 2023-02-05 DIAGNOSIS — F209 Schizophrenia, unspecified: Secondary | ICD-10-CM | POA: Diagnosis not present

## 2023-02-05 DIAGNOSIS — A6000 Herpesviral infection of urogenital system, unspecified: Secondary | ICD-10-CM | POA: Diagnosis not present

## 2023-02-05 DIAGNOSIS — Z79899 Other long term (current) drug therapy: Secondary | ICD-10-CM | POA: Diagnosis not present

## 2023-02-05 DIAGNOSIS — Z0001 Encounter for general adult medical examination with abnormal findings: Secondary | ICD-10-CM | POA: Diagnosis not present

## 2023-02-10 ENCOUNTER — Ambulatory Visit (INDEPENDENT_AMBULATORY_CARE_PROVIDER_SITE_OTHER): Payer: Medicare HMO | Admitting: Mental Health

## 2023-02-10 DIAGNOSIS — F209 Schizophrenia, unspecified: Secondary | ICD-10-CM | POA: Diagnosis not present

## 2023-02-10 NOTE — Progress Notes (Unsigned)
THERAPIST PROGRESS NOTE Virtual Visit via Video Note  I connected with Nicole [name] on 02/10/23 at 11:00 AM EDT by a video enabled telemedicine application and verified that I am speaking with the correct person using two identifiers.  Location: Patient: home address on file Provider: office   I discussed the limitations of evaluation and management by telemedicine and the availability of in person appointments. The patient expressed understanding and agreed to proceed.  I discussed the assessment and treatment plan with the patient. The patient was provided an opportunity to ask questions and all were answered. The patient agreed with the plan and demonstrated an understanding of the instructions.   The patient was advised to call back or seek an in-person evaluation if the symptoms worsen or if the condition fails to improve as anticipated.  I provided 33 minutes of non-face-to-face time during this encounter.   Nicole [name], [facility]   Session Time: 11:05 am ( 33 minutes)   Participation Level: Active  Behavioral Response: CasualAlertAdequate  Type of Therapy: Individual Therapy  Treatment Goals addressed: STG: Nicole [name] will maintain mental health stability AEB daily medication compliance and development of x 3 coping skills for sxs within the next 90 days.    STG: Nicole [name] will increase level of functioning AEB development of effective decision making skills within the next 90 days.    ProgressTowards Goals: Progressing  Interventions: CBT and Supportive  Summary: Nicole [name] is a 32 y.o. female who presents with dx of schizophrenia and major depressive disorder. Presents alert and oriented; mood and affect adequate. Speech clear and coherent at normal rate and tone. Engaged and receptive to interventions. Dressed appropriate for weather. Shares to be in a good mood and feeling well. Notes recent presentation to [facility] due to presence of suicidal thoughts. Shares to  have found out Aunt had cancer and was having a difficult time dealing with emotions. Notes for it to have also been due for her injection at this time as well. Shares history of difficulty processing difficult emotions in which she will have suicidal thoughts to cope. Shares current concern for desire to engage with a previous friend although states to owe friend money.Shares difficulty in making appropriate choices. Able to explore with therapist hx of friendship and explore possibility of being manipulated as others have suggested to her by this friend. Able to identify natural supports present and engaging in appropriate friendships. Able to explore use of coping skills to include listening to [employer], Theatre stage manager, arts and crafts and writing poetry. Denies current safety concerns; reports to be medication compliant. Sxs stable at this time; progress with goals.    Suicidal/Homicidal: Nowithout intent/plan  Therapist Response:  Therapist engaged Environmental manager in tele-therapy session. Assessed for confidentiality of session and current location. Engaged Nicole [name] in exploring ability to maintain at current placement explored abitily to adjust to recent move. Reviewed previous presentation to [facility] and factors that contributed to presence of SI. Explored in working to increase emotional regulation and distress tolerance and ability to process emotions in healthy manner. Supported in Engineer, manufacturing. Educated on boundaries between employees and participants at day programs and level of interactions. Explored presence of supports within placement and ability to engage in activities in which she enjoys. Reviewed session and provided follow up appointment. Assessed for safety.  Plan: Return again in  x 4 weeks.  Diagnosis: Schizophrenia, unspecified type (HCC)  Collaboration of Care: Other None  Patient/Guardian was advised Release of Information must be obtained  prior to any record release in order to  collaborate their care with an outside provider. Patient/Guardian was advised if they have not already done so to contact the registration department to sign all necessary forms in order for [place] to release information regarding their care.   Consent: Patient/Guardian gives verbal consent for treatment and assignment of benefits for services provided during this visit. Patient/Guardian expressed understanding and agreed to proceed.   Nicole [name], [facility] 02/10/2023

## 2023-02-20 ENCOUNTER — Emergency Department (HOSPITAL_COMMUNITY)
Admission: EM | Admit: 2023-02-20 | Discharge: 2023-02-21 | Disposition: A | Discharge status: Home / Self Care | Payer: Medicare HMO | Attending: Emergency Medicine | Admitting: Emergency Medicine

## 2023-02-20 ENCOUNTER — Encounter (HOSPITAL_COMMUNITY): Payer: Self-pay

## 2023-02-20 ENCOUNTER — Emergency Department (HOSPITAL_COMMUNITY): Payer: Medicare HMO

## 2023-02-20 ENCOUNTER — Other Ambulatory Visit: Payer: Self-pay

## 2023-02-20 DIAGNOSIS — R059 Cough, unspecified: Secondary | ICD-10-CM | POA: Diagnosis present

## 2023-02-20 DIAGNOSIS — Z1152 Encounter for screening for COVID-19: Secondary | ICD-10-CM | POA: Diagnosis not present

## 2023-02-20 DIAGNOSIS — R0981 Nasal congestion: Secondary | ICD-10-CM

## 2023-02-20 DIAGNOSIS — J069 Acute upper respiratory infection, unspecified: Secondary | ICD-10-CM | POA: Diagnosis not present

## 2023-02-20 LAB — SARS CORONAVIRUS 2 BY RT PCR: SARS Coronavirus 2 by RT PCR: NEGATIVE

## 2023-02-20 MED ORDER — ACETAMINOPHEN 500 MG PO TABS
1000.0000 mg | ORAL_TABLET | Freq: Once | ORAL | Status: AC
  Administered 2023-02-20: 1000 mg via ORAL
  Filled 2023-02-20: qty 2
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
## 2023-02-20 NOTE — ED Notes (Signed)
Pt taken to xray 

## 2023-02-20 NOTE — ED Triage Notes (Signed)
Pt comes by EMS from home for generalized body aches, nasal congestion and headache x 1 day

## 2023-02-20 NOTE — ED Provider Triage Note (Signed)
Emergency Medicine Provider Triage Evaluation Note  Nicole [name] , a 32 y.o. female  was evaluated in triage.  Pt complains of HA, congestion, cough for the last 24 to 48 hours.  She has had some associated body aches.  No recorded fever.  She has not taken any medications for this.  Denies any chest pain or shortness of breath.  No sudden onset, maximal intensity headache symptoms.  Ultimately, EMS were called for transport to the emergency department. No interventions en route.   Review of Systems  Positive: HA, congestion, cough, body aches Negative: Numbness, CP, SOB  Physical Exam  BP 113/86   Pulse 89   Temp 98.3 F (36.8 C) (Oral)   Resp 18   Ht 5\' 1"  (1.549 m)   Wt 101.2 kg   LMP 02/11/2023 (Exact Date)   SpO2 100%   BMI 42.14 kg/m  Gen:   Awake, no distress   Resp:  Normal effort; no wheezing.  MSK:   Moves extremities without difficulty  Other:  No AMS  Medical Decision Making  Medically screening exam initiated at 9:36 PM.  Appropriate orders placed.  Nicole [name] was informed that the remainder of the evaluation will be completed by another provider, this initial triage assessment does not replace that evaluation, and the importance of remaining in the ED until their evaluation is complete.  Added CXR and COVID swab.    Maia Plan, MD 02/20/23 2139

## 2023-02-21 MED ORDER — TRIAMCINOLONE ACETONIDE 55 MCG/ACT NA AERO: 2.0000 | INHALATION_SPRAY | Freq: Every day | NASAL | 0 refills | Status: DC

## 2023-02-21 NOTE — ED Provider Notes (Signed)
[place] EMERGENCY DEPARTMENT AT [facility] Provider Note   CSN: [id number] Arrival date & time: 02/20/23  2121     History  Chief Complaint  Patient presents with   Nasal Congestion    Nicole [name] is a 32 y.o. female.  The history is provided by the patient.  URI Presenting symptoms: congestion and cough   Presenting symptoms: no fever   Severity:  Moderate Onset quality:  Gradual Duration:  2 days Timing:  Constant Progression:  Unchanged Chronicity:  New Relieved by:  Nothing Worsened by:  Nothing Ineffective treatments:  None tried Associated symptoms: no arthralgias   Risk factors: not elderly        Home Medications Prior to Admission medications   Medication Sig Start Date End Date Taking? Authorizing Provider  triamcinolone (NASACORT) 55 MCG/ACT AERO nasal inhaler Place 2 sprays into the nose daily. 02/21/23  Yes Honest Safranek, MD  ABILIFY MAINTENA 400 MG PRSY prefilled syringe Inject 400 mg into the muscle every 28 (twenty-eight) days. 01/30/23   Shanna Cisco, NP  Alcohol Swabs (DROPSAFE ALCOHOL PREP) 70 % PADS Apply topically. 11/29/22   [provider]  fluticasone (FLONASE) 50 MCG/ACT nasal spray Place 1 spray into both nostrils daily. 11/29/22   [provider]  levothyroxine (SYNTHROID) 100 MCG tablet Take 1 tablet (100 mcg total) by mouth daily at 6 (six) AM. 11/23/22 01/30/23  Lauree Chandler, NP  metFORMIN (GLUCOPHAGE) 500 MG tablet Take 1 tablet (500 mg total) by mouth daily with breakfast. 11/23/22 01/30/23  Lauree Chandler, NP  Oxcarbazepine (TRILEPTAL) 300 MG tablet Take 1 tablet (300 mg total) by mouth 2 (two) times daily. TAKE 1 TABLET(300 MG) BY MOUTH TWICE DAILY Strength: 300 mg 01/30/23   Toy Cookey E, NP  pantoprazole (PROTONIX) 40 MG tablet Take 1 tablet (40 mg total) by mouth daily. 11/23/22 01/30/23  Lauree Chandler, NP  QUEtiapine (SEROQUEL) 400 MG tablet Take 1 tablet (400 mg total) by  mouth 2 (two) times daily. 01/30/23   Shanna Cisco, NP  sertraline (ZOLOFT) 100 MG tablet Take 2 tablets (200 mg total) by mouth daily. 01/30/23   Shanna Cisco, NP  traZODone (DESYREL) 100 MG tablet Take 1 tablet (100 mg total) by mouth at bedtime. 01/30/23   Shanna Cisco, NP  TRUE METRIX BLOOD GLUCOSE TEST test strip 1 each by Other route as needed (For blood sugar testing). 11/29/22   [provider]  TRUEplus Lancets 33G MISC  11/29/22   [provider]  valACYclovir (VALTREX) 500 MG tablet Take 500 mg by mouth daily.    [provider]      Allergies    Apple juice, Other, and Watermelon flavor    Review of Systems   Review of Systems  Constitutional:  Negative for fever.  HENT:  Positive for congestion.   Respiratory:  Positive for cough. Negative for stridor.   Cardiovascular:  Negative for chest pain.  Gastrointestinal:  Negative for abdominal pain.  Musculoskeletal:  Negative for arthralgias.  All other systems reviewed and are negative.   Physical Exam Updated Vital Signs BP 113/86   Pulse 89   Temp 98.3 F (36.8 C) (Oral)   Resp 18   Ht 5\' 1"  (1.549 m)   Wt 101.2 kg   LMP 02/11/2023 (Exact Date)   SpO2 100%   BMI 42.14 kg/m  Physical Exam Vitals and nursing note reviewed.  Constitutional:      General: She  is not in acute distress.    Appearance: Normal appearance. She is well-developed.  HENT:     Head: Normocephalic and atraumatic.     Nose: Congestion present.  Eyes:     Pupils: Pupils are equal, round, and reactive to light.  Cardiovascular:     Rate and Rhythm: Normal rate and regular rhythm.     Pulses: Normal pulses.     Heart sounds: Normal heart sounds.  Pulmonary:     Effort: Pulmonary effort is normal. No respiratory distress.     Breath sounds: Normal breath sounds.  Abdominal:     General: Bowel sounds are normal. There is no distension.     Palpations: Abdomen is soft.     Tenderness: There is no  abdominal tenderness. There is no guarding or rebound.  Genitourinary:    Vagina: No vaginal discharge.  Musculoskeletal:        General: Normal range of motion.     Cervical back: Normal range of motion and neck supple. No rigidity.  Lymphadenopathy:     Cervical: No cervical adenopathy.  Skin:    General: Skin is dry.     Capillary Refill: Capillary refill takes less than 2 seconds.     Findings: No erythema or rash.  Neurological:     General: No focal deficit present.     Mental Status: She is alert.     Deep Tendon Reflexes: Reflexes normal.  Psychiatric:        Mood and Affect: Mood normal.     ED Results / Procedures / Treatments   Labs (all labs ordered are listed, but only abnormal results are displayed) Labs Reviewed  SARS CORONAVIRUS 2 BY RT PCR    EKG None  Radiology DG Chest 2 View  Result Date: 02/20/2023 CLINICAL DATA:  Encounter for cough and nasal congestion EXAM: CHEST - 2 VIEW COMPARISON:  Chest radiographs 03/04/2021 FINDINGS: The heart size and mediastinal contours are within normal limits. Both lungs are clear. The visualized skeletal structures are unremarkable. IMPRESSION: No active cardiopulmonary disease. Electronically Signed   By: Minerva Fester M.D.   On: 02/20/2023 21:48    Procedures Procedures    Medications Ordered in ED Medications  acetaminophen (TYLENOL) tablet 1,000 mg (1,000 mg Oral Given 02/20/23 2136)    ED Course/ Medical Decision Making/ A&P                             Medical Decision Making URI with nasal congestion   Amount and/or Complexity of Data Reviewed External Data Reviewed: notes.    Details: Previous notes reviewed  Labs: ordered.    Details: Covid and flu  Radiology: ordered and independent interpretation performed.    Details: Normal CXR  Risk OTC drugs. Risk Details: Well appearing, stable for discharge. Nasal spray,  stable for discharge.      Final Clinical Impression(s) / ED Diagnoses Final  diagnoses:  Viral upper respiratory tract infection  Nasal congestion   Return for intractable cough, coughing up blood, fevers > 100.4 unrelieved by medication, shortness of breath, intractable vomiting, chest pain, shortness of breath, weakness, numbness, changes in speech, facial asymmetry, abdominal pain, passing out, Inability to tolerate liquids or food, cough, altered mental status or any concerns. No signs of systemic illness or infection. The patient is nontoxic-appearing on exam and vital signs are within normal limits.  I have reviewed the triage vital signs and the nursing notes.  Pertinent labs & imaging results that were available during my care of the patient were reviewed by me and considered in my medical decision making (see chart for details). After history, exam, and medical workup I feel the patient has been appropriately medically screened and is safe for discharge home. Pertinent diagnoses were discussed with the patient. Patient was given return precautions   Rx / DC Orders ED Discharge Orders          Ordered    triamcinolone (NASACORT) 55 MCG/ACT AERO nasal inhaler  Daily        02/21/23 0019              Sallee Hogrefe, MD 02/21/23 0981

## 2023-02-27 ENCOUNTER — Ambulatory Visit (HOSPITAL_COMMUNITY): Payer: Medicare HMO

## 2023-03-03 ENCOUNTER — Telehealth: Payer: Self-pay

## 2023-03-03 NOTE — Telephone Encounter (Signed)
Transition Care Management Follow-up Telephone Call Date of discharge and from where: 02/21/2023 [facility] How have you been since you were released from the hospital? Patient is feeling better. Any questions or concerns? No  Items Reviewed: Did the pt receive and understand the discharge instructions provided? Yes  Medications obtained and verified? Yes  Other? No  Any new allergies since your discharge? No  Dietary orders reviewed? Yes Do you have support at home? Yes   Follow up appointments reviewed:  PCP Hospital f/u appt confirmed?  Patient plans to call this week and schedule an appointment.  Scheduled to see  on  @ . Specialist Hospital f/u appt confirmed? No  Scheduled to see  on  @ . Are transportation arrangements needed?  Patient stated she was interested in receiving SCAT application. Verified address to send SCAT application. If their condition worsens, is the pt aware to call PCP or go to the Emergency Dept.? Yes Was the patient provided with contact information for the PCP's office or ED? Yes Was to pt encouraged to call back with questions or concerns? Yes  Ryelynn Guedea [facility]  [facility] Population Health Community Resource Care Guide   [email or web address]  ?? [phone]   Website: [email or web address]  [email or web address]

## 2023-03-20 ENCOUNTER — Ambulatory Visit (INDEPENDENT_AMBULATORY_CARE_PROVIDER_SITE_OTHER): Payer: Medicare HMO | Admitting: Mental Health

## 2023-03-20 DIAGNOSIS — F209 Schizophrenia, unspecified: Secondary | ICD-10-CM | POA: Diagnosis not present

## 2023-03-20 DIAGNOSIS — F331 Major depressive disorder, recurrent, moderate: Secondary | ICD-10-CM

## 2023-03-20 NOTE — Progress Notes (Signed)
THERAPIST PROGRESS NOTE Virtual Visit via Video Note  I connected with Nicole [name] on 03/20/23 at 11:00 AM EDT by a video enabled telemedicine application and verified that I am speaking with the correct person using two identifiers.  Location: Patient: home address on file Provider: office    I discussed the limitations of evaluation and management by telemedicine and the availability of in person appointments. The patient expressed understanding and agreed to proceed.  I discussed the assessment and treatment plan with the patient. The patient was provided an opportunity to ask questions and all were answered. The patient agreed with the plan and demonstrated an understanding of the instructions.   The patient was advised to call back or seek an in-person evaluation if the symptoms worsen or if the condition fails to improve as anticipated.  I provided 30 minutes of non-face-to-face time during this encounter.   Dorris Singh, [facility]   Session Time: 10:15 am ( 30 minutes)  Participation Level: Active  Behavioral Response: Fairly GroomedAlertDysphoric  Type of Therapy: Individual Therapy  Treatment Goals addressed: STG: Nicole [name] will maintain mental health stability AEB daily medication compliance and development of x 3 coping skills for sxs within the next 90 days.    STG: Nicole [name] will increase level of functioning AEB development of effective decision making skills within the next 90 days.   ProgressTowards Goals: Progressing  Interventions: Supportive  Summary:  Nicole [name] is a 32 y.o. female who presents with dx of schizophrenia and major depressive disorder. Presents alert and oriented; mood and affect adequate; blunted. Speech clear and coherent at normal rate and tone. Engaged and receptive to interventions. Notes feelings of sleepiness as she has taken medications during the day. Notes to be doing "Ok I guess." Shares event in which she presented to a  cook out and a friend attempted to get her to use substances and shares was able to decline. Engaged with therapist in discussion of prosocial supports and friendships she holds. Reports low mood and feelings of stress related to not being able to see her daughter with daughter's upcoming birthday. Denies to have seen daughter since she was a baby; residing with her father. Reports desire to legally have daughter reside with her; although lives currently in transitional housing. Reports some feelings of frustration related to relationship and boyfriend not coming to see her at times. Shares use of coping skills; notes medication compliance and continuing to work on making good choices for herself. Denies SI/HI/AVH  Suicidal/Homicidal: Nowithout intent/plan  Therapist Response: Therapist engaged Environmental manager in [employer]. Assessed for confidentiality of session and current location. Engaged Nicole [name] in exploring ability to manage feelings of stress/worry related to daughter and explored barriers in place for her ability to see daughter. Open ended questions exploration ability and readiness to take care of daughter independently with current living arrangement. Explored ability to engage with natural supports of positive influenced and ability to set and maintain boundaries with effective decision making. Reviewed PHQ and GAD scores. Encouraged ongoing engagement in healthy habits to maintain stability. Reviewed session and provided follow up appointment.  GAD: 16 PHQ: 14 Plan: Return again in  x 4 weeks.  Diagnosis: Schizophrenia, unspecified type (HCC)  MDD (major depressive disorder), recurrent episode, moderate (HCC)  Collaboration of Care: Other None  Patient/Guardian was advised Release of Information must be obtained prior to any record release in order to collaborate their care with an outside provider. Patient/Guardian was advised if they have not already done  so to contact the  registration department to sign all necessary forms in order for [place] to release information regarding their care.   Consent: Patient/Guardian gives verbal consent for treatment and assignment of benefits for services provided during this visit. Patient/Guardian expressed understanding and agreed to proceed.   Stephan Minister [place], [facility] 03/20/2023

## 2023-03-27 ENCOUNTER — Emergency Department (HOSPITAL_COMMUNITY): Payer: Medicare HMO

## 2023-03-27 ENCOUNTER — Emergency Department (HOSPITAL_COMMUNITY)
Admission: EM | Admit: 2023-03-27 | Discharge: 2023-03-27 | Discharge status: Against Medical Advice | Payer: Medicare HMO | Attending: Emergency Medicine | Admitting: Emergency Medicine

## 2023-03-27 ENCOUNTER — Other Ambulatory Visit: Payer: Self-pay

## 2023-03-27 ENCOUNTER — Encounter (HOSPITAL_COMMUNITY): Payer: Self-pay | Admitting: Emergency Medicine

## 2023-03-27 DIAGNOSIS — Z5321 Procedure and treatment not carried out due to patient leaving prior to being seen by health care provider: Secondary | ICD-10-CM | POA: Diagnosis not present

## 2023-03-27 DIAGNOSIS — R0602 Shortness of breath: Secondary | ICD-10-CM | POA: Diagnosis not present

## 2023-03-27 DIAGNOSIS — M25552 Pain in left hip: Secondary | ICD-10-CM | POA: Diagnosis not present

## 2023-03-27 DIAGNOSIS — R202 Paresthesia of skin: Secondary | ICD-10-CM | POA: Diagnosis not present

## 2023-03-27 DIAGNOSIS — R200 Anesthesia of skin: Secondary | ICD-10-CM | POA: Insufficient documentation

## 2023-03-27 DIAGNOSIS — R531 Weakness: Secondary | ICD-10-CM | POA: Insufficient documentation

## 2023-03-27 LAB — I-STAT CHEM 8, ED
BUN: 8 mg/dL (ref 6–20)
Calcium, Ion: 1.13 mmol/L — ABNORMAL LOW (ref 1.15–1.40)
Chloride: 102 mmol/L (ref 98–111)
Creatinine, Ser: 1 mg/dL (ref 0.44–1.00)
Glucose, Bld: 78 mg/dL (ref 70–99)
HCT: 38 % (ref 36.0–46.0)
Hemoglobin: 12.9 g/dL (ref 12.0–15.0)
Potassium: 3.6 mmol/L (ref 3.5–5.1)
Sodium: 139 mmol/L (ref 135–145)
TCO2: 27 mmol/L (ref 22–32)

## 2023-03-27 LAB — DIFFERENTIAL
Abs Immature Granulocytes: 0.01 10*3/uL (ref 0.00–0.07)
Basophils Absolute: 0 10*3/uL (ref 0.0–0.1)
Basophils Relative: 0 %
Eosinophils Absolute: 0 10*3/uL (ref 0.0–0.5)
Eosinophils Relative: 0 %
Immature Granulocytes: 0 %
Lymphocytes Relative: 50 %
Lymphs Abs: 3.7 10*3/uL (ref 0.7–4.0)
Monocytes Absolute: 0.4 10*3/uL (ref 0.1–1.0)
Monocytes Relative: 5 %
Neutro Abs: 3.4 10*3/uL (ref 1.7–7.7)
Neutrophils Relative %: 45 %

## 2023-03-27 LAB — COMPREHENSIVE METABOLIC PANEL
ALT: 21 U/L (ref 0–44)
AST: 16 U/L (ref 15–41)
Albumin: 3.3 g/dL — ABNORMAL LOW (ref 3.5–5.0)
Alkaline Phosphatase: 55 U/L (ref 38–126)
Anion gap: 7 (ref 5–15)
BUN: 8 mg/dL (ref 6–20)
CO2: 27 mmol/L (ref 22–32)
Calcium: 8.8 mg/dL — ABNORMAL LOW (ref 8.9–10.3)
Chloride: 103 mmol/L (ref 98–111)
Creatinine, Ser: 0.9 mg/dL (ref 0.44–1.00)
GFR, Estimated: >60 mL/min (ref >60)
Glucose, Bld: 81 mg/dL (ref 70–99)
Potassium: 3.6 mmol/L (ref 3.5–5.1)
Sodium: 137 mmol/L (ref 135–145)
Total Bilirubin: 0.2 mg/dL — ABNORMAL LOW (ref 0.3–1.2)
Total Protein: 6.6 g/dL (ref 6.5–8.1)

## 2023-03-27 LAB — CBC
HCT: 37.6 % (ref 36.0–46.0)
Hemoglobin: 12.3 g/dL (ref 12.0–15.0)
MCH: 29.6 pg (ref 26.0–34.0)
MCHC: 32.7 g/dL (ref 30.0–36.0)
MCV: 90.6 fL (ref 80.0–100.0)
Platelets: 343 10*3/uL (ref 150–400)
RBC: 4.15 MIL/uL (ref 3.87–5.11)
RDW: 13.5 % (ref 11.5–15.5)
WBC: 7.4 10*3/uL (ref 4.0–10.5)
nRBC: 0 % (ref 0.0–0.2)

## 2023-03-27 LAB — APTT: aPTT: 28 seconds (ref 24–36)

## 2023-03-27 LAB — PROTIME-INR
INR: 1 (ref 0.8–1.2)
Prothrombin Time: 13.1 seconds (ref 11.4–15.2)

## 2023-03-27 LAB — ETHANOL: Alcohol, Ethyl (B): <10 mg/dL (ref <10)

## 2023-03-27 LAB — I-STAT BETA HCG BLOOD, ED (MC, WL, AP ONLY): I-stat hCG, quantitative: <5 m[IU]/mL (ref <5)

## 2023-03-27 NOTE — ED Notes (Signed)
Called Pt x3 for vs check. With no response. Pt  seems to have eloped from lobby.

## 2023-03-27 NOTE — ED Provider Triage Note (Signed)
Emergency Medicine Provider Triage Evaluation Note  Nicole [name] , a 32 y.o. female  was evaluated in triage.  Pt complains of diffuse left-sided pain, numbness, tingling, and weakness.  States that she woke up with the symptoms.  Unable to localize any point the pain that is worse in any other spots.  No associated fever, chills, nausea, vomiting, or diarrhea.  States that "sometimes this happens when my breathing is off."  Review of Systems  Positive: See HPI Negative: See HPI  Physical Exam  BP 109/78   Pulse 88   Temp 98.8 F (37.1 C)   Resp 18   Ht 5\' 1"  (1.549 m)   Wt 96.2 kg   SpO2 99%   BMI 40.06 kg/m  Gen:   Awake, no distress   Resp:  Normal effort lungs clear to auscultation, no signs of respiratory distress MSK:   Moves extremities without difficulty no lower extremity edema Other:  Alert and oriented, abdomen soft and nontender, no midline spinal tenderness, no meningismus, 5/5 strength to bilateral upper and lower extremities, cranial nerves intact, normal sensation to light touch bilaterally, normal speech  Medical Decision Making  Medically screening exam initiated at 6:08 PM.  Appropriate orders placed.  Hensley Aziz was informed that the remainder of the evaluation will be completed by another provider, this initial triage assessment does not replace that evaluation, and the importance of remaining in the ED until their evaluation is complete.     Tonette Lederer, PA-C 03/27/23 1810

## 2023-03-27 NOTE — ED Triage Notes (Signed)
Pt BIB EMS from home pt states at 9am this morning she started having L sided pain, L hip pain, tingling, and weakness on the L side. Pt able to stand and ambulate in triage area.  EMS VS: CBG 100 146/73 99% RA 93 HR

## 2023-04-10 ENCOUNTER — Ambulatory Visit (HOSPITAL_COMMUNITY): Payer: Medicare HMO

## 2023-05-30 ENCOUNTER — Encounter (HOSPITAL_COMMUNITY): Payer: Self-pay | Admitting: *Deleted

## 2023-05-30 ENCOUNTER — Other Ambulatory Visit: Payer: Self-pay

## 2023-05-30 ENCOUNTER — Emergency Department (HOSPITAL_COMMUNITY): Payer: Medicare HMO

## 2023-05-30 ENCOUNTER — Emergency Department (HOSPITAL_COMMUNITY)
Admission: EM | Admit: 2023-05-30 | Discharge: 2023-05-30 | Disposition: A | Discharge status: Home / Self Care | Payer: Medicare HMO | Source: Home / Self Care | Attending: Emergency Medicine | Admitting: Emergency Medicine

## 2023-05-30 DIAGNOSIS — R1110 Vomiting, unspecified: Secondary | ICD-10-CM | POA: Diagnosis not present

## 2023-05-30 DIAGNOSIS — E039 Hypothyroidism, unspecified: Secondary | ICD-10-CM | POA: Diagnosis not present

## 2023-05-30 DIAGNOSIS — R1084 Generalized abdominal pain: Secondary | ICD-10-CM | POA: Insufficient documentation

## 2023-05-30 DIAGNOSIS — R1030 Lower abdominal pain, unspecified: Secondary | ICD-10-CM | POA: Diagnosis present

## 2023-05-30 LAB — HEPATIC FUNCTION PANEL
ALT: 21 U/L (ref 0–44)
AST: 21 U/L (ref 15–41)
Albumin: 3.4 g/dL — ABNORMAL LOW (ref 3.5–5.0)
Alkaline Phosphatase: 72 U/L (ref 38–126)
Bilirubin, Direct: <0.1 mg/dL (ref 0.0–0.2)
Total Bilirubin: 0.3 mg/dL (ref 0.3–1.2)
Total Protein: 7 g/dL (ref 6.5–8.1)

## 2023-05-30 LAB — CBC WITH DIFFERENTIAL/PLATELET
Abs Immature Granulocytes: 0.01 10*3/uL (ref 0.00–0.07)
Basophils Absolute: 0 10*3/uL (ref 0.0–0.1)
Basophils Relative: 0 %
Eosinophils Absolute: 0 10*3/uL (ref 0.0–0.5)
Eosinophils Relative: 0 %
HCT: 35.7 % — ABNORMAL LOW (ref 36.0–46.0)
Hemoglobin: 11.7 g/dL — ABNORMAL LOW (ref 12.0–15.0)
Immature Granulocytes: 0 %
Lymphocytes Relative: 43 %
Lymphs Abs: 2.4 10*3/uL (ref 0.7–4.0)
MCH: 29.3 pg (ref 26.0–34.0)
MCHC: 32.8 g/dL (ref 30.0–36.0)
MCV: 89.5 fL (ref 80.0–100.0)
Monocytes Absolute: 0.5 10*3/uL (ref 0.1–1.0)
Monocytes Relative: 8 %
Neutro Abs: 2.8 10*3/uL (ref 1.7–7.7)
Neutrophils Relative %: 49 %
Platelets: 284 10*3/uL (ref 150–400)
RBC: 3.99 MIL/uL (ref 3.87–5.11)
RDW: 15.1 % (ref 11.5–15.5)
WBC: 5.7 10*3/uL (ref 4.0–10.5)
nRBC: 0 % (ref 0.0–0.2)

## 2023-05-30 LAB — LIPASE, BLOOD: Lipase: 24 U/L (ref 11–51)

## 2023-05-30 LAB — URINALYSIS, ROUTINE W REFLEX MICROSCOPIC
Bilirubin Urine: NEGATIVE
Glucose, UA: NEGATIVE mg/dL
Ketones, ur: NEGATIVE mg/dL
Nitrite: NEGATIVE
Protein, ur: NEGATIVE mg/dL
Specific Gravity, Urine: 1.02 (ref 1.005–1.030)
pH: 7.5 (ref 5.0–8.0)

## 2023-05-30 LAB — BASIC METABOLIC PANEL
Anion gap: 12 (ref 5–15)
BUN: 5 mg/dL — ABNORMAL LOW (ref 6–20)
CO2: 24 mmol/L (ref 22–32)
Calcium: 8.9 mg/dL (ref 8.9–10.3)
Chloride: 103 mmol/L (ref 98–111)
Creatinine, Ser: 0.96 mg/dL (ref 0.44–1.00)
GFR, Estimated: >60 mL/min (ref >60)
Glucose, Bld: 90 mg/dL (ref 70–99)
Potassium: 3.6 mmol/L (ref 3.5–5.1)
Sodium: 139 mmol/L (ref 135–145)

## 2023-05-30 LAB — URINALYSIS, MICROSCOPIC (REFLEX): Bacteria, UA: NONE SEEN

## 2023-05-30 LAB — PREGNANCY, URINE: Preg Test, Ur: NEGATIVE

## 2023-05-30 MED ORDER — GADOBUTROL 1 MMOL/ML IV SOLN
9.6000 mL | Freq: Once | INTRAVENOUS | Status: AC | PRN
  Administered 2023-05-30: 9.6 mL via INTRAVENOUS

## 2023-05-30 MED ORDER — ONDANSETRON HCL 4 MG PO TABS: 4.0000 mg | ORAL_TABLET | Freq: Four times a day (QID) | ORAL | 0 refills | Status: DC

## 2023-05-30 MED ORDER — MORPHINE SULFATE (PF) 4 MG/ML IV SOLN
4.0000 mg | Freq: Once | INTRAVENOUS | Status: AC
  Administered 2023-05-30: 4 mg via INTRAVENOUS
  Filled 2023-05-30: qty 1

## 2023-05-30 NOTE — ED Provider Notes (Signed)
Patient seen after prior ED provider.  Patient's MRI does not suggest acute pathology.  Patient feels improved at completion of ED evaluation.  She understands need for close outpatient follow-up with PCP and also with GYN.  Importance of close follow-up stressed.  Strict return precautions given and understood.   Wynetta Fines, MD 05/30/23 1901

## 2023-05-30 NOTE — ED Triage Notes (Signed)
Patient presents to ED via [facility] c/o abd. Pain and vaginal spotting onset 3 days ago, LMP 2 weeks ago

## 2023-05-30 NOTE — ED Provider Notes (Signed)
Nicole [name] AT [facility] Provider Note   CSN: [id number] Arrival date & time: 05/30/23  1014     History  Chief Complaint  Patient presents with   Abdominal Pain    Nicole [name] is a 32 y.o. female.  Nicole [name] is a 32 yo F with PMH schizophrenia, MDD, hypothyroidism presenting to [facility] ED on 8/16 with complaint of four days of abdominal pain. She describes the pain as "like labor contractions" as it is intermittently sharp and occurs all over the abdomen and radiates to the bilateral lower back. The focus of the pain is in the lower abdominal region. She does not know the cause of the pain and denies new foods, travel, or sick contacts. She ascribes to three episodes of non-bloody bileous emesis the last two days, none today. She is not nauseous and denies diarrhea, though her stool is more frequent than usual - now three times daily - and without blood or melena. She ascribes to increased urination but no dysuria. She has maintained her appetite and po intake. She had similar pain in the past when she had an ovarian cyst that ruptured spontaneously. Denies fevers, chills, nausea, diarrhea, chest pain, dysuria.   Abdominal Pain Associated symptoms: vomiting   Associated symptoms: no chest pain, no chills, no constipation, no cough, no diarrhea, no fatigue, no fever, no nausea and no shortness of breath        Home Medications Prior to Admission medications   Medication Sig Start Date End Date Taking? Authorizing Provider  ABILIFY MAINTENA 400 MG PRSY prefilled syringe Inject 400 mg into the muscle every 28 (twenty-eight) days. 01/30/23   Shanna Cisco, NP  Alcohol Swabs (DROPSAFE ALCOHOL PREP) 70 % PADS Apply topically. 11/29/22   [provider]  fluticasone (FLONASE) 50 MCG/ACT nasal spray Place 1 spray into both nostrils daily. 11/29/22   [provider]  levothyroxine (SYNTHROID) 100 MCG tablet Take 1 tablet (100 mcg  total) by mouth daily at 6 (six) AM. 11/23/22 01/30/23  Lauree Chandler, NP  metFORMIN (GLUCOPHAGE) 500 MG tablet Take 1 tablet (500 mg total) by mouth daily with breakfast. 11/23/22 01/30/23  Lauree Chandler, NP  Oxcarbazepine (TRILEPTAL) 300 MG tablet Take 1 tablet (300 mg total) by mouth 2 (two) times daily. TAKE 1 TABLET(300 MG) BY MOUTH TWICE DAILY Strength: 300 mg 01/30/23   Toy Cookey E, NP  pantoprazole (PROTONIX) 40 MG tablet Take 1 tablet (40 mg total) by mouth daily. 11/23/22 01/30/23  Lauree Chandler, NP  QUEtiapine (SEROQUEL) 400 MG tablet Take 1 tablet (400 mg total) by mouth 2 (two) times daily. 01/30/23   Shanna Cisco, NP  sertraline (ZOLOFT) 100 MG tablet Take 2 tablets (200 mg total) by mouth daily. 01/30/23   Shanna Cisco, NP  traZODone (DESYREL) 100 MG tablet Take 1 tablet (100 mg total) by mouth at bedtime. 01/30/23   Shanna Cisco, NP  triamcinolone (NASACORT) 55 MCG/ACT AERO nasal inhaler Place 2 sprays into the nose daily. 02/21/23   Palumbo, April, MD  TRUE METRIX BLOOD GLUCOSE TEST test strip 1 each by Other route as needed (For blood sugar testing). 11/29/22   [provider]  TRUEplus Lancets 33G MISC  11/29/22   [provider]  valACYclovir (VALTREX) 500 MG tablet Take 500 mg by mouth daily.    [provider]      Allergies    Apple juice, Other, and Watermelon flavor  Review of Systems   Review of Systems  Constitutional:  Negative for activity change, chills, fatigue and fever.  Respiratory:  Negative for cough, shortness of breath and wheezing.   Cardiovascular:  Negative for chest pain and palpitations.  Gastrointestinal:  Positive for abdominal pain and vomiting. Negative for blood in stool, constipation, diarrhea and nausea.  Endocrine: Positive for polyuria.  Genitourinary:  Positive for flank pain.  Musculoskeletal:  Positive for back pain.  Neurological:  Negative for dizziness, weakness and  headaches.    Physical Exam Updated Vital Signs BP 108/70   Pulse 73   Temp 98.4 F (36.9 C) (Oral)   Resp 16   Ht 5\' 1"  (1.549 m)   Wt 96.2 kg   SpO2 100%   BMI 40.07 kg/m  Physical Exam Constitutional:      Appearance: She is well-developed. She is obese.  HENT:     Head: Normocephalic and atraumatic.  Eyes:     Extraocular Movements: Extraocular movements intact.  Cardiovascular:     Rate and Rhythm: Normal rate and regular rhythm.  Pulmonary:     Effort: Pulmonary effort is normal. No respiratory distress.     Breath sounds: Normal breath sounds. No wheezing.  Abdominal:     General: Abdomen is flat. Bowel sounds are normal. There is no distension. There are no signs of injury.     Palpations: Abdomen is soft. There is no fluid wave, hepatomegaly, splenomegaly, mass or pulsatile mass.     Tenderness: There is generalized abdominal tenderness. There is no guarding or rebound.  Skin:    General: Skin is warm and dry.     Capillary Refill: Capillary refill takes less than 2 seconds.  Neurological:     General: No focal deficit present.     Mental Status: She is alert and oriented to person, place, and time.  Psychiatric:        Mood and Affect: Mood normal.        Behavior: Behavior normal.     ED Results / Procedures / Treatments   Labs (all labs ordered are listed, but only abnormal results are displayed) Labs Reviewed  BASIC METABOLIC PANEL - Abnormal; Notable for the following components:      Result Value   BUN 5 (*)    All other components within normal limits  CBC WITH DIFFERENTIAL/PLATELET - Abnormal; Notable for the following components:   Hemoglobin 11.7 (*)    HCT 35.7 (*)    All other components within normal limits  URINALYSIS, ROUTINE W REFLEX MICROSCOPIC - Abnormal; Notable for the following components:   Hgb urine dipstick SMALL (*)    Leukocytes,Ua TRACE (*)    All other components within normal limits  HEPATIC FUNCTION PANEL - Abnormal;  Notable for the following components:   Albumin 3.4 (*)    All other components within normal limits  PREGNANCY, URINE  URINALYSIS, MICROSCOPIC (REFLEX)  LIPASE, BLOOD    EKG None  Radiology No results found.  Procedures Procedures    Medications Ordered in ED Medications  morphine (PF) 4 MG/ML injection 4 mg (4 mg Intravenous Given 05/30/23 1347)    ED Course/ Medical Decision Making/ A&P                                 Medical Decision Making Pt is 32 yo F with abdominal pain rated 10/10 that radiates to back - and similar  to a past experience when she had a ruptured ovarian cyst. Differential however is broad and includes pancreatitis, diverticulitis, ischemic bowel, gastroenteritis, appendicitis. VSS throughout admission. Exam unremarkable except for generalized abdominal tenderness without rebound, guarding, distension. Basic labs are unremakrable without a white count or electrolyte abnormalities. Lipase WNL, liver functions WNL. UA non infectious. Morphine provided for pain control. Although she may be experiencing gastroenteritis given her history of similar pain and pain out of proportion to exam, will proceed first with pelvic ultrasound according to her history. Result: 3.9 cm echogenic lesion in the right ovary, uncertain etiology, solid lesion is not excluded. Recommend further workup with MRI with and without contrast to further delineate when appropriate. MRI ordered. Status pending at time of handoff.   Amount and/or Complexity of Data Reviewed Labs: ordered. Radiology: ordered.  Risk Prescription drug management.          Final Clinical Impression(s) / ED Diagnoses Final diagnoses:  None    Rx / DC Orders ED Discharge Orders     None         Katheran James, DO 05/30/23 1623    Franne Forts, DO 05/31/23 [phone]

## 2023-05-30 NOTE — Discharge Instructions (Signed)
Return for any problem.  ?

## 2023-05-30 NOTE — ED Notes (Signed)
Patient transported to MRI 

## 2023-07-24 IMAGING — CT CT ABD-PELV W/ CM
2 of 4 series · 17 of 46 positions shown, 19 images · IV contrast (APPLIED)
Comparison: Chest radiograph 03/04/2021

CLINICAL DATA: Acute nonlocalized abdominal pain. Nausea and
vaginal bleeding.

EXAM:
CT ABDOMEN AND PELVIS WITH CONTRAST
TECHNIQUE: Multidetector CT imaging of the abdomen and pelvis was performed
using the standard protocol following bolus administration of
intravenous contrast.
CONTRAST:  100mL OMNIPAQUE IOHEXOL 300 MG/ML  SOLN

[Series 3: abd/ pelvis 5.0 i30f 2 · axial · 0.89mm/px · z∈[+856,+1296]mm · 14 of 96 slices shown, 16 images]
[im 4/96  soft-tissue]
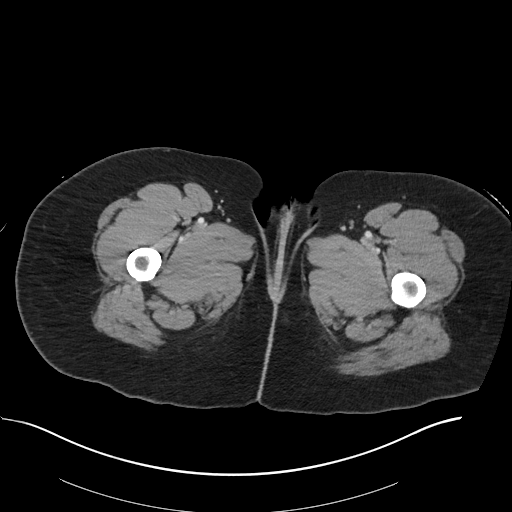
[im 4/96  bone]
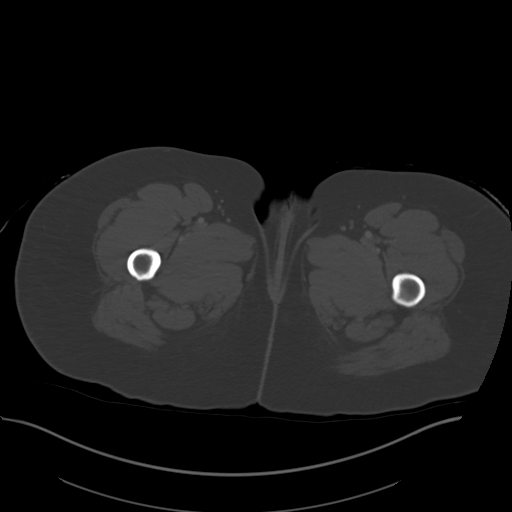
[im 12/96  soft-tissue]
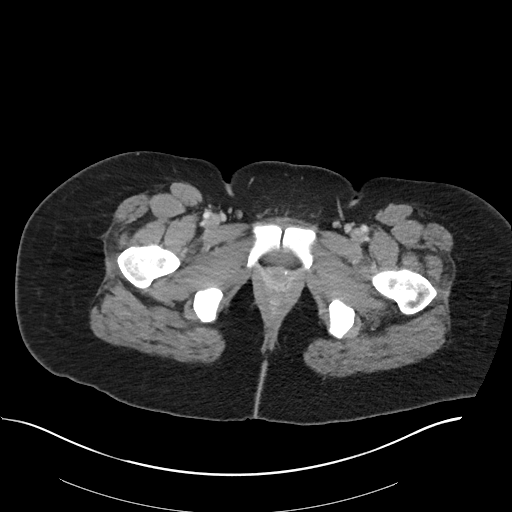
[im 20/96  soft-tissue]
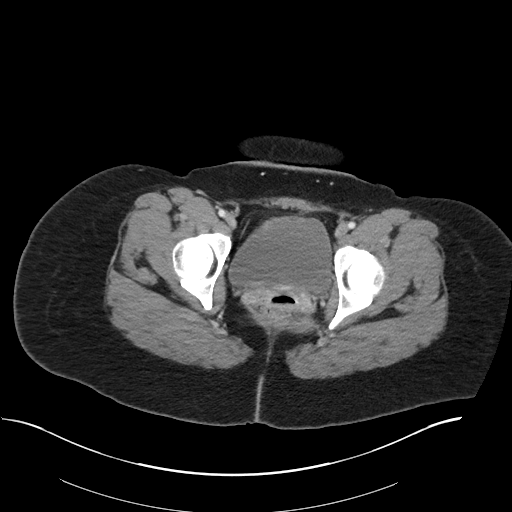
[im 27/96  soft-tissue]
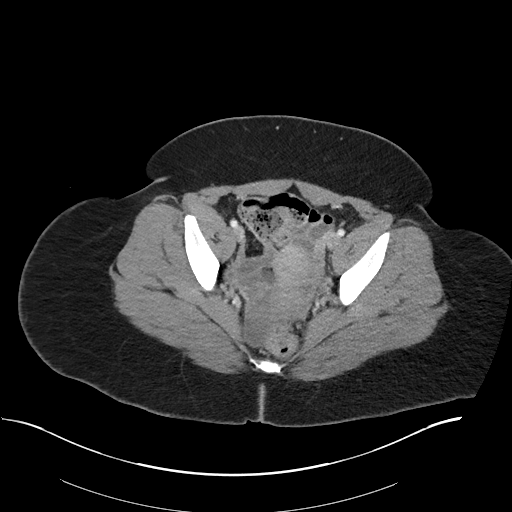
[im 31/96  soft-tissue]
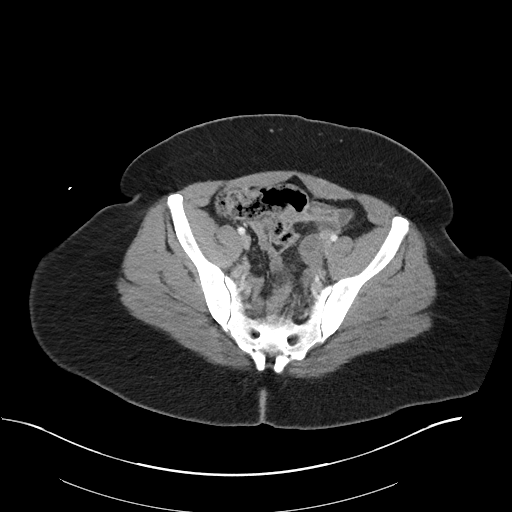
[im 39/96  soft-tissue]
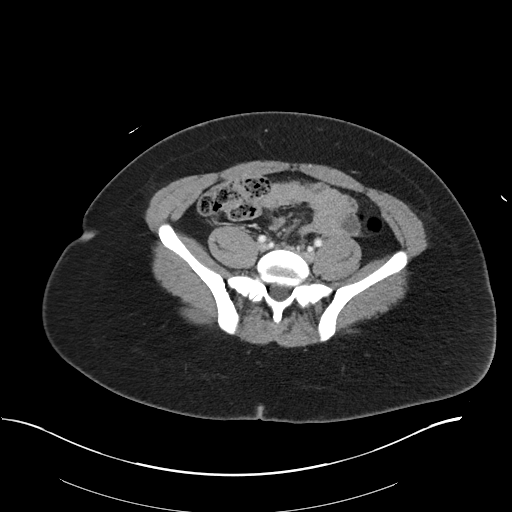
[im 46/96  soft-tissue]
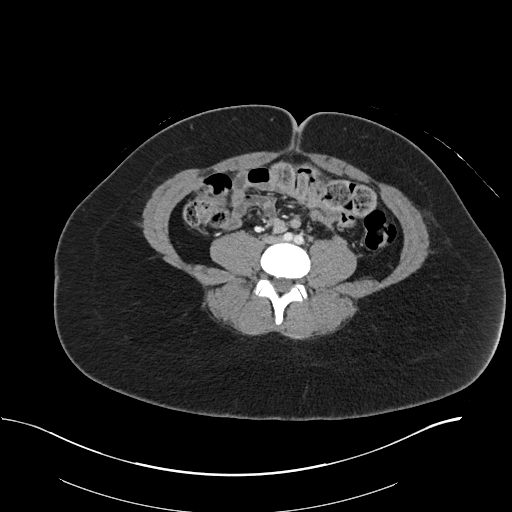
[im 50/96  soft-tissue]
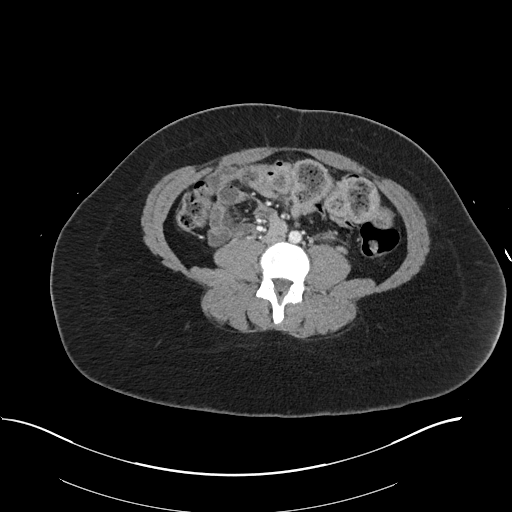
[im 58/96  soft-tissue]
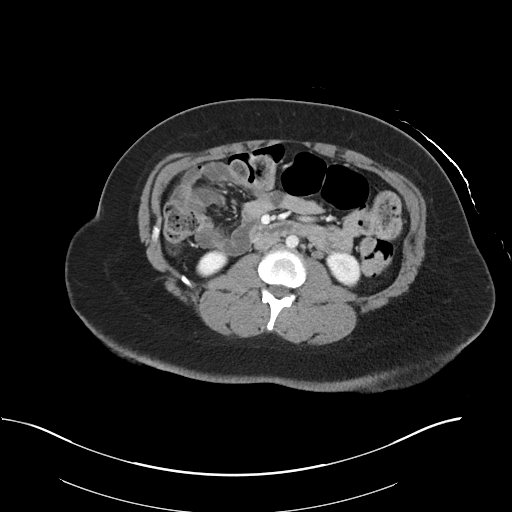
[im 58/96  bone]
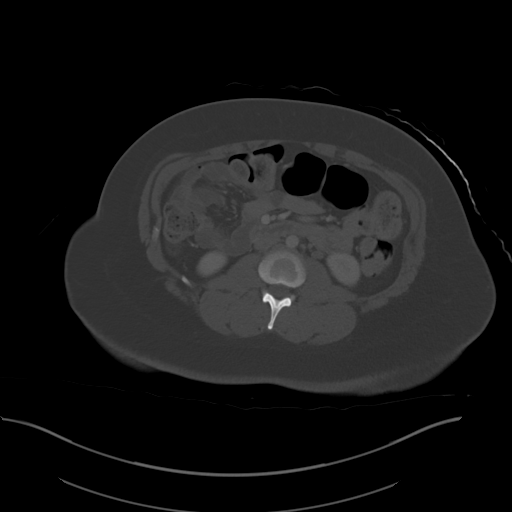
[im 65/96  soft-tissue]
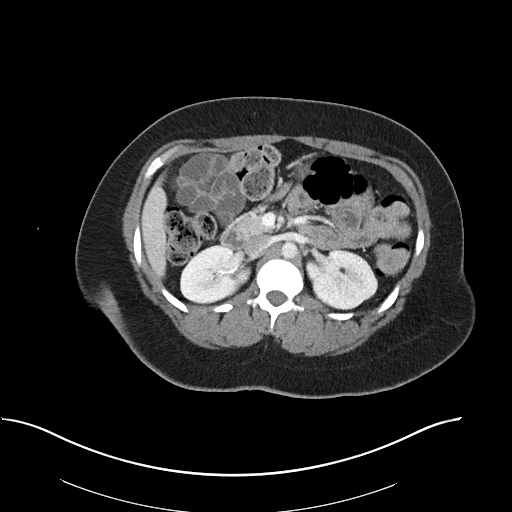
[im 73/96  soft-tissue]
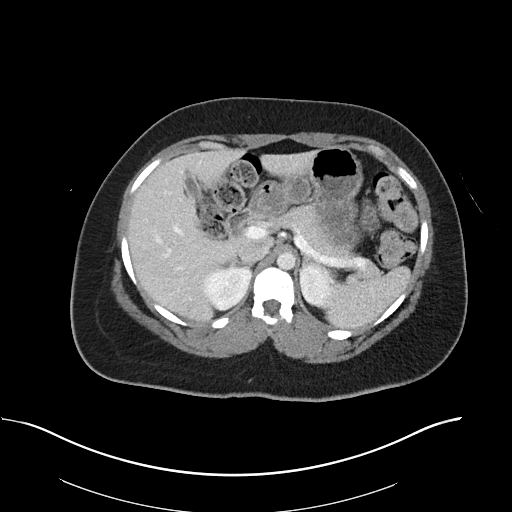
[im 77/96  soft-tissue]
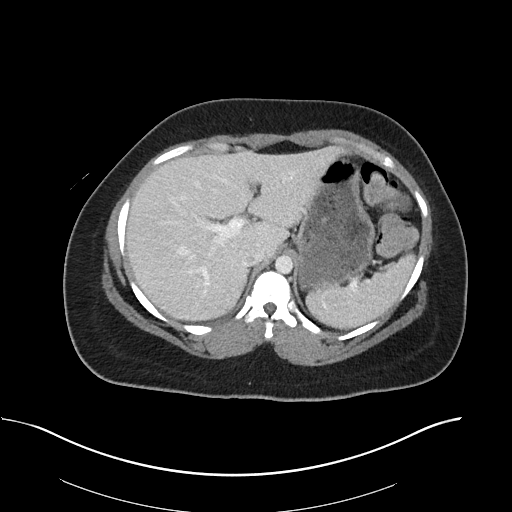
[im 84/96  soft-tissue]
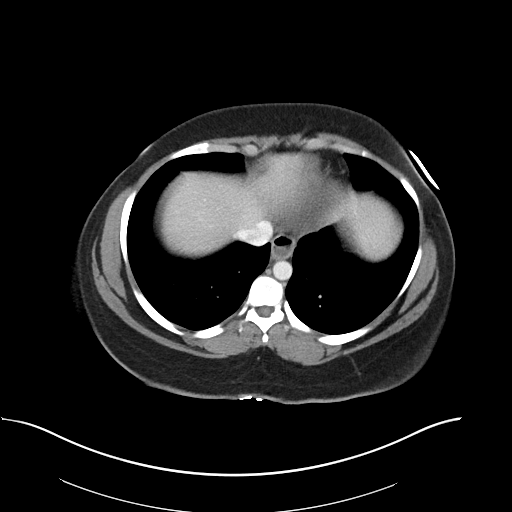
[im 92/96  soft-tissue]
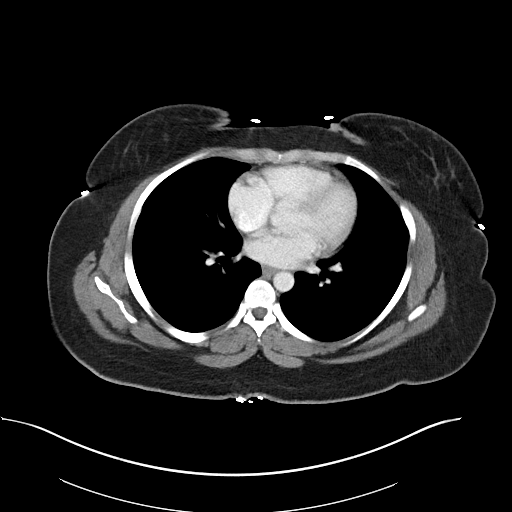

[Series 6: coronal soft tissue · coronal · 0.90mm/px · 3 of 101 slices shown]
[im 34/101  soft-tissue]
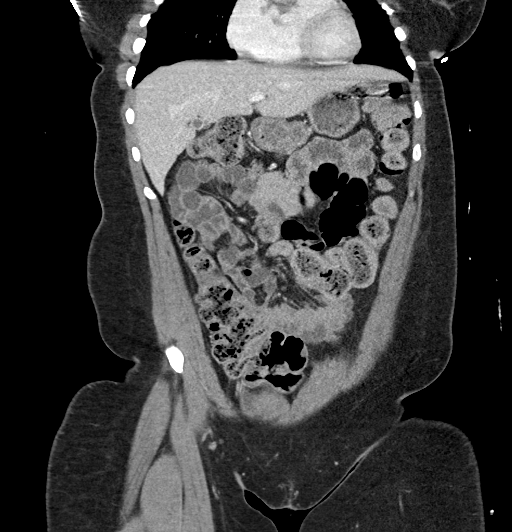
[im 45/101  soft-tissue]
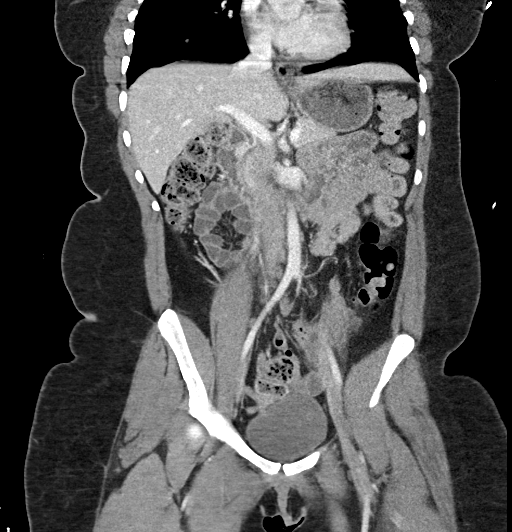
[im 56/101  soft-tissue]
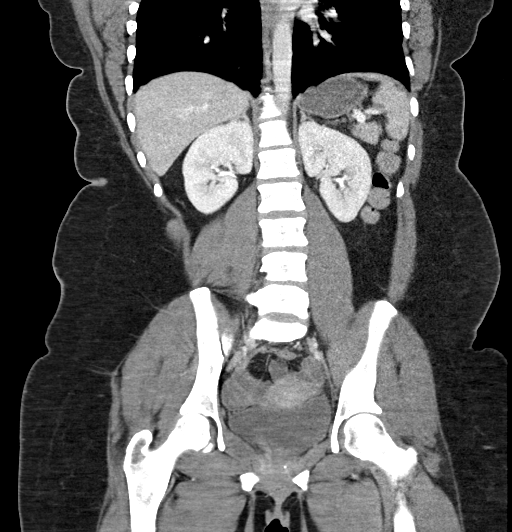

[17 of 46 positions shown; findings below may reference images not displayed]

FINDINGS: Lower chest: Lung bases are clear.  Small esophageal hiatal hernia.

Hepatobiliary: No focal liver abnormality is seen. No gallstones,
gallbladder wall thickening, or biliary dilatation.

Pancreas: Unremarkable. No pancreatic ductal dilatation or
surrounding inflammatory changes.

Spleen: Normal in size without focal abnormality.

Adrenals/Urinary Tract: Adrenal glands are unremarkable. Kidneys are
normal, without renal calculi, focal lesion, or hydronephrosis.
Bladder is unremarkable.

Stomach/Bowel: Stomach, small bowel, and colon are not abnormally
distended. Stool throughout the colon. No wall thickening or
inflammatory changes appreciated. Appendix is not identified.

Vascular/Lymphatic: No significant vascular findings are present. No
enlarged abdominal or pelvic lymph nodes.

Reproductive: Uterus and bilateral adnexa are unremarkable. Small
amount of free fluid in the pelvis is likely physiologic.

Other: No free air in the abdomen. Abdominal wall musculature
appears intact.

Musculoskeletal: No acute or significant osseous findings.
IMPRESSION: No acute process demonstrated in the abdomen or pelvis. Small amount
of free fluid in the pelvis is likely physiologic. No bowel
obstruction or inflammation.

## 2023-08-25 ENCOUNTER — Ambulatory Visit (HOSPITAL_COMMUNITY)
Admission: EM | Admit: 2023-08-25 | Discharge: 2023-08-25 | Disposition: A | Discharge status: Home / Self Care | Payer: Medicare HMO | Attending: Psychiatry | Admitting: Psychiatry

## 2023-08-25 DIAGNOSIS — F209 Schizophrenia, unspecified: Secondary | ICD-10-CM | POA: Diagnosis not present

## 2023-08-25 DIAGNOSIS — Z91148 Patient's other noncompliance with medication regimen for other reason: Secondary | ICD-10-CM | POA: Diagnosis not present

## 2023-08-25 DIAGNOSIS — Z79899 Other long term (current) drug therapy: Secondary | ICD-10-CM | POA: Insufficient documentation

## 2023-08-25 DIAGNOSIS — F17210 Nicotine dependence, cigarettes, uncomplicated: Secondary | ICD-10-CM | POA: Diagnosis not present

## 2023-08-25 DIAGNOSIS — R4586 Emotional lability: Secondary | ICD-10-CM | POA: Diagnosis not present

## 2023-08-25 DIAGNOSIS — Z760 Encounter for issue of repeat prescription: Secondary | ICD-10-CM | POA: Diagnosis not present

## 2023-08-25 MED ORDER — HYDROXYZINE HCL 25 MG PO TABS
50.0000 mg | ORAL_TABLET | ORAL | Status: AC
  Administered 2023-08-25: 50 mg via ORAL
  Filled 2023-08-25: qty 2

## 2023-08-25 NOTE — Progress Notes (Signed)
   08/25/23 2117  [facility] Triage Screening (Walk-ins at [facility] only)  How Did You Hear About [place]? Legal System  What Is the Reason for Your Visit/Call Today? Pt presents to [facility] voluntarily, accompanied by [facility] requesting medication. Pt reports that her mind is racing and states " my mood is all over the place". Pt reports that she has not had prescribed medication in a few weeks. Pt was unable to remember prescribed medications. Pt states diagnosis of Schizoaffective Disorder and Major Depressive Disorder. Pt currently denies SI,HI,AVH.  How Long Has This Been Causing You Problems? 1 wk - 1 month  Have You Recently Had Any Thoughts About Hurting Yourself? No  Are You Planning to Commit Suicide/Harm Yourself At This time? No  Have you Recently Had Thoughts About Hurting Someone [name]? No  Are You Planning To Harm Someone At This Time? No  Are you currently experiencing any auditory, visual or other hallucinations? No  Have You Used Any Alcohol or Drugs in the Past 24 Hours? Yes  How long ago did you use Drugs or Alcohol? earlier today  What Did You Use and How Much? a few sips of vodka with coke  Do you have any current medical co-morbidities that require immediate attention? No  Clinician description of patient physical appearance/behavior: Pt is calm and cooperative, dressed in pajama set. Pt is alert  What Do You Feel Would Help You the Most Today? Treatment for Depression or other mood problem;Medication(s)  If access to [facility] was not available, would you have sought care in the Emergency Department? No  Determination of Need Routine (7 days)  Options For Referral Other: Comment;Medication Management;Outpatient Therapy

## 2023-08-25 NOTE — ED Provider Notes (Signed)
Behavioral Health Urgent Care Medical Screening Exam  Patient Name: Nicole [name] MRN: [id number] Date of Evaluation: 08/25/23 Chief Complaint:   Diagnosis:  Final diagnoses:  Mood swings  Schizophrenia, unspecified type (HCC)  Encounter for medication refill    History of Present illness: Nicole [name] is a 32 y.o. female. With a history of schizophrenia, MDD, suicidal ideation.  Presented to [facility] via [employer] voluntarily.  Per the patient she had called the police to bring her here so she can come get her medicines.  When asked what medicine she is taking patient did not know however patient stated she has not taken her medicine in a couple of weeks.  I review of patient records show that patient in the past was prescribed Abilify 400 mg injection, Seroquel 400 mg twice daily, trazodone 100 mg at bedtime Trileptal 300 mg twice daily.  Discussed with patient that she needs to return to the walk-in psychiatry tomorrow so that they can refill her medications.  According to the patient having crying spells and sadness but do not know why.  When asked if she is suicidal patient stated no.  According to patient she has a roommate that she lives with.   Of face-to-face evaluation of patient, patient is alert and oriented x 4, speech is clear, maintaining eye contact when talking.  Patient denies suicidal ideation, HI, AVH or paranoia.  According to patient she smoke cigarettes only and drink alcohol sometimes.  Patient denies access to guns denied that she is a risk to herself or others at this time.  Writer discussed with patient the need to return to the walk-in psychiatry in the morning so she can have her medications patient refilled by her provider.  Discussed with patient that for tonight I will only give her a dose of hydroxyzine 50 mg but she need to return tomorrow to have her medications refill, preferably restart her Abilify monthly injection.  Patient is educated that she should call 911 or  return to the facility in case she experience suicidal ideation homicidal ideation or hallucination.  Or if she feels she is a threat to herself or others.  Patient verbalized understanding.  Meds ordered this encounter  Medications  . hydrOXYzine (ATARAX) tablet 50 mg     Recommend discharge for patient to follow up with walk-in psychiatry in the morning. Flowsheet Row ED from 08/25/2023 in [facility] ED from 05/30/2023 in [facility] Emergency Department at [facility] ED from 03/27/2023 in [facility] Emergency Department at [facility]  C-SSRS RISK CATEGORY No Risk No Risk No Risk       Psychiatric Specialty Exam  Presentation  General Appearance:Casual  Eye Contact:Good  Speech:Clear and Coherent; Normal Rate  Speech Volume:Normal  Handedness:Right   Mood and Affect  Mood: Euthymic; Anxious  Affect: Appropriate   Thought Process  Thought Processes: Coherent  Descriptions of Associations:Intact  Orientation:Full (Time, Place and Person)  Thought Content:WDL  Diagnosis of Schizophrenia or Schizoaffective disorder in past: Yes   Hallucinations:None Patient reports hearing voices  telling her nobody cares and she should just go and die denies  Ideas of Reference:None  Suicidal Thoughts:No With Plan  Homicidal Thoughts:No   Sensorium  Memory: Immediate Fair  Judgment: Fair  Insight: Fair   Art therapist  Concentration: Fair  Attention Span: Good  Recall: Poor  Fund of Knowledge: Good  Language: Good   Psychomotor Activity  Psychomotor Activity: Normal   Assets  Assets: Desire for  Improvement   Sleep  Sleep: Fair  Number of hours:  8   Physical Exam: Physical Exam HENT:     Head: Normocephalic.     Nose: Nose normal.  Eyes:     Pupils: Pupils are equal, round, and reactive to light.  Cardiovascular:     Rate and Rhythm: Normal rate.  Pulmonary:     Effort:  Pulmonary effort is normal.  Musculoskeletal:        General: Normal range of motion.     Cervical back: Normal range of motion.  Neurological:     General: No focal deficit present.     Mental Status: She is alert.  Psychiatric:        Mood and Affect: Mood normal.        Behavior: Behavior normal.        Thought Content: Thought content normal.        Judgment: Judgment normal.   Review of Systems  Constitutional: Negative.   HENT: Negative.    Eyes: Negative.   Respiratory: Negative.    Cardiovascular: Negative.   Gastrointestinal: Negative.   Genitourinary: Negative.   Musculoskeletal: Negative.   Skin: Negative.   Neurological: Negative.   Psychiatric/Behavioral:  The patient is nervous/anxious.    Blood pressure (!) 133/97, pulse 83, temperature 98.3 F (36.8 C), temperature source Oral, resp. rate 18, SpO2 100%. There is no height or weight on file to calculate BMI.  Musculoskeletal: Strength & Muscle Tone: within normal limits Gait & Station: normal Patient leans: N/A   [facility] MSE Discharge Disposition for Follow up and Recommendations: Based on my evaluation the patient does not appear to have an emergency medical condition and can be discharged with resources and follow up care in outpatient services for Medication Management   Sindy Guadeloupe, NP 08/25/2023, 10:24 PM

## 2023-09-03 ENCOUNTER — Encounter (HOSPITAL_COMMUNITY): Payer: Self-pay

## 2023-09-03 ENCOUNTER — Emergency Department (HOSPITAL_COMMUNITY)
Admission: EM | Admit: 2023-09-03 | Discharge: 2023-09-03 | Discharge status: Against Medical Advice | Payer: Medicare HMO | Attending: Emergency Medicine | Admitting: Emergency Medicine

## 2023-09-03 DIAGNOSIS — Z5321 Procedure and treatment not carried out due to patient leaving prior to being seen by health care provider: Secondary | ICD-10-CM | POA: Insufficient documentation

## 2023-09-03 DIAGNOSIS — R519 Headache, unspecified: Secondary | ICD-10-CM | POA: Insufficient documentation

## 2023-09-03 MED ORDER — ACETAMINOPHEN 500 MG PO TABS: 1000.0000 mg | ORAL_TABLET | Freq: Once | ORAL | Status: DC

## 2023-09-03 NOTE — ED Provider Triage Note (Signed)
Emergency Medicine Provider Triage Evaluation Note  Nicole [name] , a 32 y.o. female  was evaluated in triage.  Pt complains of headache of 3-day duration.  Review of Systems    Physical Exam  BP 126/86 (BP Location: Left Arm)   Pulse 88   Temp 98.6 F (37 C)   Resp 15   Ht 5\' 1"  (1.549 m)   Wt 93 kg   SpO2 100%   BMI 38.73 kg/m  Gen:   Awake, no distress   Resp:  Normal effort  MSK:   Moves extremities without difficulty  Other:  Neuro-- cranial nerves II through XII intact, no weakness, normal gait  Medical Decision Making  Medically screening exam initiated at 1:21 PM.  Appropriate orders placed.  Nicole [name] was informed that the remainder of the evaluation will be completed by another provider, this initial triage assessment does not replace that evaluation, and the importance of remaining in the ED until their evaluation is complete.     Nicole [name] T, DO 09/03/23 1321

## 2023-09-03 NOTE — ED Triage Notes (Signed)
Headache, generalized aches and sinus drainage x 3 days.

## 2023-09-03 NOTE — ED Notes (Signed)
Pt called x2 with no response. Moved OTF

## 2023-09-15 ENCOUNTER — Ambulatory Visit (HOSPITAL_COMMUNITY)
Admission: EM | Admit: 2023-09-15 | Discharge: 2023-09-15 | Disposition: A | Discharge status: Home / Self Care | Payer: Medicare HMO | Attending: Addiction Medicine | Admitting: Addiction Medicine

## 2023-09-15 DIAGNOSIS — Z7984 Long term (current) use of oral hypoglycemic drugs: Secondary | ICD-10-CM | POA: Insufficient documentation

## 2023-09-15 DIAGNOSIS — R45851 Suicidal ideations: Secondary | ICD-10-CM | POA: Diagnosis present

## 2023-09-15 DIAGNOSIS — E119 Type 2 diabetes mellitus without complications: Secondary | ICD-10-CM | POA: Insufficient documentation

## 2023-09-15 DIAGNOSIS — F4310 Post-traumatic stress disorder, unspecified: Secondary | ICD-10-CM | POA: Diagnosis not present

## 2023-09-15 DIAGNOSIS — F4321 Adjustment disorder with depressed mood: Secondary | ICD-10-CM | POA: Diagnosis not present

## 2023-09-15 DIAGNOSIS — R4584 Anhedonia: Secondary | ICD-10-CM | POA: Insufficient documentation

## 2023-09-15 DIAGNOSIS — F259 Schizoaffective disorder, unspecified: Secondary | ICD-10-CM | POA: Diagnosis present

## 2023-09-15 DIAGNOSIS — F330 Major depressive disorder, recurrent, mild: Secondary | ICD-10-CM

## 2023-09-15 DIAGNOSIS — F32A Depression, unspecified: Secondary | ICD-10-CM | POA: Diagnosis present

## 2023-09-15 NOTE — Progress Notes (Addendum)
Ms. Nicole Sandy "Stan Head" was contacted, known well to the patient.  She reported that Nicole [name] is always welcome back to the home and is a pleasure while living there.  Ms. Nicole Sandy advised that she has no safety concerns with the patient returning to her home after her assessment.  Ms. Nicole Sandy said that she can be contacted any time that is needed at [phone] (her home number).  She was thankful for the services provided here and to this patient today.   Ms. Nicole Sandy reported that Nicole [name] has a "wonderful support system" at her home and "Nicole [name] is always welcome back here."

## 2023-09-15 NOTE — Discharge Instructions (Signed)
Please continue to take medications as directed. If your symptoms return, worsen, or persist please call your 911, report to local ER, or contact crisis hotline. Please do not drink alcohol or use any illegal substances while taking prescription medications.  °

## 2023-09-15 NOTE — Progress Notes (Signed)
Patient reported that she "appreciated being able to come here today" and was "thankful for being able to talk to someone".  She reported that she would like to go back to the home where she is staying with Ms. Waynetta Sandy.

## 2023-09-15 NOTE — Progress Notes (Signed)
   09/15/23 1454  [facility] Triage Screening (Walk-ins at [facility] only)  What Is the Reason for Your Visit/Call Today? Nicole [name] is a 32 year old female presenting to [facility] with [employer] (front entrance drop off). Patient chief complaint is "I don't have a purpose". Pt reports SI for the past two weeks. Pt denies plan to harm herself. Pt reports she broke up with her partner about a month ago and she has been feeling down since. Pt reports she is living in a transitional house with a church but she has not been there much. Pt denies HI, AVH. Reports drinking four loko yesterday. Denies drug use. Pt appears to be seeking inpt treatment.  How Long Has This Been Causing You Problems? 1 wk - 1 month  Have You Recently Had Any Thoughts About Hurting Yourself? Yes  How long ago did you have thoughts about hurting yourself? 2 weeks  Are You Planning to Commit Suicide/Harm Yourself At This time? No  Have you Recently Had Thoughts About Hurting Someone Nicole [name]? No  Are You Planning To Harm Someone At This Time? No  Physical Abuse Denies  Verbal Abuse Denies  Sexual Abuse Denies  Exploitation of patient/patient's resources Denies  Self-Neglect Denies  Are you currently experiencing any auditory, visual or other hallucinations? No  Have You Used Any Alcohol or Drugs in the Past 24 Hours? Yes  How long ago did you use Drugs or Alcohol? YESTERDAY  What Did You Use and How Much? 1 FOUR LOKO  Do you have any current medical co-morbidities that require immediate attention? No  Clinician description of patient physical appearance/behavior: DEPRESSED  What Do You Feel Would Help You the Most Today? Treatment for Depression or other mood problem  If access to [facility]gent [facility] was not available, would you have sought care in the Emergency Department? No  Determination of Need Routine (7 days)  Options For Referral Medication Management;Outpatient Therapy

## 2023-09-15 NOTE — ED Provider Notes (Addendum)
Behavioral Health Urgent Care Medical Screening Exam  Patient Name: Nicole [name] MRN: [id number] Date of Evaluation: 09/15/23 Chief Complaint:   Diagnosis:  Final diagnoses:  None    History of Present illness:   Nicole [name] is a 32 year old female with a past psychiatric history of schizoaffective disorder on Abilify maintain on, episodes of depressed mood complicated by SI, and borderline intellectual functioning who presents voluntarily with 1 month of worsening depression/mood swings and 2 weeks of passive suicidal ideation in the setting of recent break-up and job loss.  Transported by [employer] at patient request.  She said she ended this relationship approximately a month ago and presented to [facility] urgent care because she was worried that she will hurt herself.  Recently spoke with her brother, who agreed with this assessment.  Patient says that she "does not have a purpose" which is a feeling that it typically happens when people close to her pass away.  She also does not feel that her medication regimen, which she is consistent with, is working appropriately.  Patient was amenable to admission overnight but decided later that she felt better after discussing with providers + nursing staff and would like to go home.    Patient is pan positive for neurovegetative symptoms, including: reduced sleep (5 hours/night), anhedonia, difficulty concentrating, low energy, eating less than normal, and passive suicidality without plan.  Patient denies active SI.  Is able to contract for safety -- will reach out to family or staff if thoughts intensified.  Denies homicidal ideation.  Patient endorsed increased mood swings over the past month, and has lashed out verbally but not physically to those around her. No access to firearms.   Patient denies manic symptoms.  Patient endorses generalized anxiety out of proportion to stressor.  Endorses childhood trauma, unspecified, with the following PTSD symptoms:  Avoidance, hyperarousal, flashbacks.  Endorses panic attacks when around large crowds-last episode some months ago, symptoms include: Chest pain, diaphoresis, feelings of impending doom.  Is unable to quantify how often these happen, says "once in a while."  Predictable.  Denies AVH.   Patient interview substance use over the past month.  Is somewhat of a poor historian -- endorses drinking "every day" at least a glass of wine over the past few weeks.  Sometimes drinks for loco.  Irregular marijuana use.  Has been smoking 0.5 packs/day over the past 3 years.  Denies other substance use history.   Father/guardian Nicole [name], ([phone]) was called x2, no response.  Past Medical History: The patient was not able to name ongoing medical conditions -- per chart review, patient has a diagnosis of hypothyroidism.  Per patient, she has not taken Synthroid 100 mcg since February of this year.  Is prescribed metformin 500 mg daily for a diagnosis of type 2 diabetes, which patient has not been taking.  Per chart review, patient also has a history of hypertension.  No medication prescribed for this.  Saw a PCP within the last year.  Denies further medical history.  No history of head trauma or seizure.   Family History: No known family psychiatric history.  Endorses polysubstance use history and mom and brother.  Per patient, mom has either attempted or completed suicide.  Per patient, family has a history of diabetes and stroke.   Social History: Patient currently lives in transitional housing.  She is not currently working -- recently lost her job at the [employer].  Highest education with patient: Ninth grade.  No outstanding legal issues.  No military involvement.  Currently has 1 child, who lives with patient's dad  Flowsheet Row ED from 09/15/2023 in [facility] ED from 09/03/2023 in [facility] Emergency Department at [facility] ED from 08/25/2023 in [facility]  C-SSRS RISK CATEGORY Moderate Risk No Risk No Risk       Psychiatric Specialty Exam  Presentation  General Appearance:Casual  Eye Contact:Good  Speech:Clear and Coherent; Normal Rate  Speech Volume:Normal  Handedness:Right   Mood and Affect  Mood: Depressed  Affect: Congruent; Constricted   Thought Process  Thought Processes: Coherent  Descriptions of Associations:Intact  Orientation:Full (Time, Place and Person)  Thought Content:WDL  Diagnosis of Schizophrenia or Schizoaffective disorder in past: Yes   Hallucinations:None Patient reports hearing voices  telling her nobody cares and she should just go and die denies  Ideas of Reference:None  Suicidal Thoughts:Yes, Passive With Plan Without Intent; Without Plan  Homicidal Thoughts:No   Sensorium  Memory: Immediate Fair  Judgment: Intact  Insight: Fair   Chartered certified accountant: Fair  Attention Span: Good  Recall: Fair  Fund of Knowledge: Good  Language: Good   Psychomotor Activity  Psychomotor Activity: Normal   Assets  Assets: Desire for Improvement; Housing   Sleep  Sleep: Poor  Number of hours:  5   Physical Exam: Physical Exam Vitals and nursing note reviewed.  Constitutional:      General: She is not in acute distress.    Appearance: She is well-developed.  HENT:     Head: Normocephalic and atraumatic.  Eyes:     Conjunctiva/sclera: Conjunctivae normal.  Pulmonary:     Effort: Pulmonary effort is normal. No respiratory distress.  Abdominal:     Palpations: Abdomen is soft.  Musculoskeletal:        General: No swelling.     Cervical back: Neck supple.  Skin:    General: Skin is warm and dry.  Neurological:     Mental Status: She is alert.  Psychiatric:        Mood and Affect: Mood normal.    Review of Systems  All other systems reviewed and are negative.  Blood pressure 120/77, pulse 91, temperature 98.9 F (37.2  C), temperature source Oral, resp. rate 18, SpO2 100%. There is no height or weight on file to calculate BMI.  Musculoskeletal: Strength & Muscle Tone: within normal limits Gait & Station: normal Patient leans: N/A   [facility] MSE Discharge Disposition for Follow up and Recommendations: Based on my evaluation the patient does not appear to have an emergency medical condition and can be discharged with resources and follow up care in outpatient services for Medication Management and Individual Therapy.  Patient is a 32 year old woman with a extensive past psychiatric history including schizoaffective disorder, MDD presents with worsening depressed mood and suicidality over the past month after breaking up with her partner. She endorses ongoing, passive SI but was able to contract for safety. Collateral was reached who confirmed that patient can safely discharge home. Patient felt better after discussing events with staff + providers.   Tomie China, MD 09/15/2023, 4:54 PM

## 2023-09-15 NOTE — Progress Notes (Signed)
[name] contacted at [phone]. He had no issues with her returning back to her housing with Ms. Waynetta Sandy.  He was thankful for care provided to her today.

## 2023-10-02 ENCOUNTER — Other Ambulatory Visit: Payer: Self-pay

## 2023-10-02 ENCOUNTER — Emergency Department (HOSPITAL_BASED_OUTPATIENT_CLINIC_OR_DEPARTMENT_OTHER): Payer: Medicare HMO

## 2023-10-02 ENCOUNTER — Encounter (HOSPITAL_BASED_OUTPATIENT_CLINIC_OR_DEPARTMENT_OTHER): Payer: Self-pay

## 2023-10-02 ENCOUNTER — Emergency Department (HOSPITAL_BASED_OUTPATIENT_CLINIC_OR_DEPARTMENT_OTHER)
Admission: EM | Admit: 2023-10-02 | Discharge: 2023-10-02 | Disposition: A | Discharge status: Home / Self Care | Payer: Medicare HMO | Attending: Emergency Medicine | Admitting: Emergency Medicine

## 2023-10-02 DIAGNOSIS — Z7984 Long term (current) use of oral hypoglycemic drugs: Secondary | ICD-10-CM | POA: Insufficient documentation

## 2023-10-02 DIAGNOSIS — Z79899 Other long term (current) drug therapy: Secondary | ICD-10-CM | POA: Insufficient documentation

## 2023-10-02 DIAGNOSIS — N938 Other specified abnormal uterine and vaginal bleeding: Secondary | ICD-10-CM | POA: Diagnosis not present

## 2023-10-02 DIAGNOSIS — R1084 Generalized abdominal pain: Secondary | ICD-10-CM | POA: Diagnosis present

## 2023-10-02 LAB — COMPREHENSIVE METABOLIC PANEL
ALT: 12 U/L (ref 0–44)
AST: 13 U/L — ABNORMAL LOW (ref 15–41)
Albumin: 3.9 g/dL (ref 3.5–5.0)
Alkaline Phosphatase: 54 U/L (ref 38–126)
Anion gap: 4 — ABNORMAL LOW (ref 5–15)
BUN: 8 mg/dL (ref 6–20)
CO2: 29 mmol/L (ref 22–32)
Calcium: 9 mg/dL (ref 8.9–10.3)
Chloride: 105 mmol/L (ref 98–111)
Creatinine, Ser: 0.79 mg/dL (ref 0.44–1.00)
GFR, Estimated: >60 mL/min (ref >60)
Glucose, Bld: 87 mg/dL (ref 70–99)
Potassium: 4.6 mmol/L (ref 3.5–5.1)
Sodium: 138 mmol/L (ref 135–145)
Total Bilirubin: 0.3 mg/dL (ref <1.2)
Total Protein: 6.9 g/dL (ref 6.5–8.1)

## 2023-10-02 LAB — URINALYSIS, ROUTINE W REFLEX MICROSCOPIC
Bilirubin Urine: NEGATIVE
Glucose, UA: NEGATIVE mg/dL
Hgb urine dipstick: NEGATIVE
Ketones, ur: NEGATIVE mg/dL
Leukocytes,Ua: NEGATIVE
Nitrite: NEGATIVE
Specific Gravity, Urine: 1.02 (ref 1.005–1.030)
pH: 8 (ref 5.0–8.0)

## 2023-10-02 LAB — CBC
HCT: 38.7 % (ref 36.0–46.0)
Hemoglobin: 12.5 g/dL (ref 12.0–15.0)
MCH: 28.5 pg (ref 26.0–34.0)
MCHC: 32.3 g/dL (ref 30.0–36.0)
MCV: 88.4 fL (ref 80.0–100.0)
Platelets: 288 10*3/uL (ref 150–400)
RBC: 4.38 MIL/uL (ref 3.87–5.11)
RDW: 14.7 % (ref 11.5–15.5)
WBC: 5.2 10*3/uL (ref 4.0–10.5)
nRBC: 0 % (ref 0.0–0.2)

## 2023-10-02 LAB — LIPASE, BLOOD: Lipase: 11 U/L (ref 11–51)

## 2023-10-02 LAB — PREGNANCY, URINE: Preg Test, Ur: NEGATIVE

## 2023-10-02 MED ORDER — IOHEXOL 300 MG/ML  SOLN
100.0000 mL | Freq: Once | INTRAMUSCULAR | Status: AC | PRN
  Administered 2023-10-02: 100 mL via INTRAVENOUS

## 2023-10-02 NOTE — ED Notes (Signed)
After Pt left with Guardian... Pt officially discharged.Marland KitchenMarland Kitchen

## 2023-10-02 NOTE — ED Triage Notes (Signed)
Pt c/o generalized abd pain x2 days, sharp/ stabbing in nature. Denies nvd, no additional symptoms

## 2023-10-02 NOTE — ED Provider Notes (Signed)
[place] EMERGENCY DEPARTMENT AT [facility] Provider Note   CSN: [id number] Arrival date & time: 10/02/23  1028     History  Chief Complaint  Patient presents with   Abdominal Pain    Nicole [name] is a 32 y.o. female.  Pt complains of right sided abdominal pain. Pt reports symptoms began 2 days ago. Pt denies nausea or vomiting.  Pt denies diarrhea.  Pt states pain comes and goes.  Pt reports she has been eating and drinking normally.    The history is provided by the patient. No language interpreter was used.  Abdominal Pain Pain location:  Generalized Pain quality: aching   Pain radiates to:  Does not radiate Pain severity:  Moderate Onset quality:  Gradual Timing:  Constant Progression:  Worsening Chronicity:  New Context: not alcohol use   Relieved by:  Nothing Worsened by:  Nothing Ineffective treatments:  None tried Associated symptoms: no chest pain, no chills and no cough   Risk factors: no NSAID use and not pregnant        Home Medications Prior to Admission medications   Medication Sig Start Date End Date Taking? Authorizing Provider  ABILIFY MAINTENA 400 MG PRSY prefilled syringe Inject 400 mg into the muscle every 28 (twenty-eight) days. 01/30/23   Shanna Cisco, NP  Alcohol Swabs (DROPSAFE ALCOHOL PREP) 70 % PADS Apply topically. 11/29/22   [provider]  fluticasone (FLONASE) 50 MCG/ACT nasal spray Place 1 spray into both nostrils daily. 11/29/22   [provider]  levothyroxine (SYNTHROID) 100 MCG tablet Take 1 tablet (100 mcg total) by mouth daily at 6 (six) AM. 11/23/22 01/30/23  Lauree Chandler, NP  metFORMIN (GLUCOPHAGE) 500 MG tablet Take 1 tablet (500 mg total) by mouth daily with breakfast. 11/23/22 01/30/23  Lauree Chandler, NP  ondansetron (ZOFRAN) 4 MG tablet Take 1 tablet (4 mg total) by mouth every 6 (six) hours. 05/30/23   Wynetta Fines, MD  Oxcarbazepine (TRILEPTAL) 300 MG tablet Take 1 tablet  (300 mg total) by mouth 2 (two) times daily. TAKE 1 TABLET(300 MG) BY MOUTH TWICE DAILY Strength: 300 mg 01/30/23   Toy Cookey E, NP  pantoprazole (PROTONIX) 40 MG tablet Take 1 tablet (40 mg total) by mouth daily. 11/23/22 01/30/23  Lauree Chandler, NP  QUEtiapine (SEROQUEL) 400 MG tablet Take 1 tablet (400 mg total) by mouth 2 (two) times daily. 01/30/23   Shanna Cisco, NP  sertraline (ZOLOFT) 100 MG tablet Take 2 tablets (200 mg total) by mouth daily. 01/30/23   Shanna Cisco, NP  traZODone (DESYREL) 100 MG tablet Take 1 tablet (100 mg total) by mouth at bedtime. 01/30/23   Shanna Cisco, NP  triamcinolone (NASACORT) 55 MCG/ACT AERO nasal inhaler Place 2 sprays into the nose daily. 02/21/23   Palumbo, April, MD  TRUE METRIX BLOOD GLUCOSE TEST test strip 1 each by Other route as needed (For blood sugar testing). 11/29/22   [provider]  TRUEplus Lancets 33G MISC  11/29/22   [provider]  valACYclovir (VALTREX) 500 MG tablet Take 500 mg by mouth daily.    [provider]      Allergies    Apple juice, Other, and Watermelon flavoring agent (non-screening)    Review of Systems   Review of Systems  Constitutional:  Negative for chills.  Respiratory:  Negative for cough.   Cardiovascular:  Negative for chest pain.  Gastrointestinal:  Positive for abdominal pain.  All  other systems reviewed and are negative.   Physical Exam Updated Vital Signs BP (!) 116/90 (BP Location: Left Arm)   Pulse 73   Temp 98.1 F (36.7 C)   Resp 16   LMP 09/23/2023 (Exact Date)   SpO2 100%  Physical Exam Vitals and nursing note reviewed.  Constitutional:      Appearance: She is well-developed.  HENT:     Head: Normocephalic.  Cardiovascular:     Rate and Rhythm: Normal rate and regular rhythm.  Pulmonary:     Effort: Pulmonary effort is normal.  Abdominal:     General: Bowel sounds are normal. There is no distension.     Palpations: Abdomen is  soft.     Tenderness: There is generalized abdominal tenderness.  Genitourinary:    Vagina: Bleeding present.  Musculoskeletal:        General: Normal range of motion.     Cervical back: Normal range of motion.  Skin:    General: Skin is warm.  Neurological:     Mental Status: She is alert and oriented to person, place, and time.  Psychiatric:        Mood and Affect: Mood normal.     ED Results / Procedures / Treatments   Labs (all labs ordered are listed, but only abnormal results are displayed) Labs Reviewed  COMPREHENSIVE METABOLIC PANEL - Abnormal; Notable for the following components:      Result Value   AST 13 (*)    Anion gap 4 (*)    All other components within normal limits  URINALYSIS, ROUTINE W REFLEX MICROSCOPIC - Abnormal; Notable for the following components:   Protein, ur TRACE (*)    All other components within normal limits  LIPASE, BLOOD  CBC  PREGNANCY, URINE    EKG None  Radiology CT ABDOMEN PELVIS W CONTRAST Result Date: 10/02/2023 CLINICAL DATA:  Abdominal pain, acute, nonlocalized Pt c/o generalized abd pain x2 days, sharp/ stabbing in nature. EXAM: CT ABDOMEN AND PELVIS WITH CONTRAST TECHNIQUE: Multidetector CT imaging of the abdomen and pelvis was performed using the standard protocol following bolus administration of intravenous contrast. RADIATION DOSE REDUCTION: This exam was performed according to the departmental dose-optimization program which includes automated exposure control, adjustment of the mA and/or kV according to patient size and/or use of iterative reconstruction technique. CONTRAST:  OMNIPAQUE IOHEXOL 300 MG/ML  SOLN COMPARISON:  CT AP 10/12/2021. FINDINGS: Lower chest: No acute abnormality. Hepatobiliary: No focal liver abnormality. No gallstones, gallbladder wall thickening, or biliary dilatation. Pancreas: No pancreatic ductal dilatation or surrounding inflammatory changes. Spleen: Normal in size without focal abnormality.  Small perihilar accessory spleen Adrenals/Urinary Tract: Adrenal glands are unremarkable. Kidneys are normal, without renal calculi, focal lesion, or hydronephrosis. Bladder is unremarkable. Stomach/Bowel: Stomach is within normal limits. Appendix appears normal. No evidence of bowel wall thickening, distention, or inflammatory changes. Vascular/Lymphatic: No significant vascular findings are present. No enlarged abdominal or pelvic lymph nodes. Reproductive: Prominent RIGHT ovarian follicle. Uterus and adnexa are unremarkable. Other: No abdominal wall hernia or abnormality. Trace free fluid within the dependent pelvis, physiologic in a female of child-bearing age. Musculoskeletal: No acute or significant osseous findings. IMPRESSION: No acute abdominopelvic process Electronically Signed   By: Roanna Banning M.D.   On: 10/02/2023 16:11    Procedures Procedures    Medications Ordered in ED Medications  iohexol (OMNIPAQUE) 300 MG/ML solution 100 mL (100 mLs Intravenous Contrast Given 10/02/23 1248)    ED Course/ Medical Decision Making/ A&P  Medical Decision Making Pt complains of abdominal pain for 2 days.  Pt reports pain comes and goes but is getting worse.    Amount and/or Complexity of Data Reviewed Labs: ordered. Decision-making details documented in ED Course.    Details: Labs ordered reviewed and interpreted.  CBC, Ua and Cmet normal  Radiology: ordered and independent interpretation performed. Decision-making details documented in ED Course.    Details: Ct scan shows no acute findings  Risk Prescription drug management.           Final Clinical Impression(s) / ED Diagnoses Final diagnoses:  Generalized abdominal pain    Rx / DC Orders ED Discharge Orders     None      An After Visit Summary was printed and given to the patient.    Elson Areas, [place] 10/02/23 1746    Jacalyn Lefevre, MD 10/03/23 [phone]

## 2023-10-02 NOTE — Discharge Instructions (Signed)
Try taking a dose of miralax today and tomorrow.  Follow up with your Physicain for recheck

## 2023-10-02 NOTE — ED Notes (Signed)
Discharge paperwork given and verbally understood.... Pt kept in the room until her caregiver arrived.Marland KitchenMarland Kitchen

## 2023-10-02 NOTE — ED Notes (Addendum)
Legal guardian (father) attempted to be contacted on the phone x3... Guardian did not answer phone... Pt Caox4, Pt left with Caregiver - [facility].

## 2023-10-02 NOTE — ED Notes (Signed)
Charge RN is moving the Pt to a different part of the ER and taking over care until Caregiver arrive.Marland KitchenMarland Kitchen

## 2023-10-02 NOTE — ED Notes (Signed)
Escorted patient to the car, confirmed with patient that it was the legal guardian in the car that came and picked her up.

## 2023-10-15 NOTE — L&D Delivery Note (Signed)
 OB/GYN Faculty Practice Delivery Note  Nicole [name] is a 33 y.o. G2P1001 s/p SVD at [redacted]w[redacted]d. She was admitted for spontaneous rupture of membranes.   ROM: 33h 73m with clear fluid GBS Status: unknown   Maximum Maternal Temperature: afebrile  Labor Progress: Initial SVE: 1.5/50/-3. She then progressed to complete.   Delivery Date/Time: 10/04/2024 at 1223 Delivery: Called to room and patient was complete and pushing. Head delivered ROA. No nuchal cord present. Shoulder and body delivered in usual fashion. Infant with spontaneous cry, placed on mother's abdomen, dried and stimulated. Cord clamped x 2 after 30 second delay due to lack of spontaneous effort, cut by physician and taken to warmer. NICU team called. Cord blood drawn. Placenta delivered spontaneously with gentle cord traction. Fundus firm with massage and Pitocin . Labia, perineum, vagina, and cervix inspected inspected with perineal abrasion. Cord pH 7.2. Baby Weight: pending  Placenta: Sent to L&D Complications: None Lacerations: perineal abrasion EBL: 125 mL Analgesia: Epidural   Infant:  APGAR (1 MIN): 4  APGAR (5 MINS): 7  APGAR (10 MINS): 8    Leighana Neyman J Dalayla Aldredge, DO [facility] 10/04/2024, 1:22 PM

## 2023-10-23 ENCOUNTER — Emergency Department (HOSPITAL_COMMUNITY)
Admission: EM | Admit: 2023-10-23 | Discharge: 2023-10-23 | Discharge status: Against Medical Advice | Payer: Medicare HMO | Attending: Emergency Medicine | Admitting: Emergency Medicine

## 2023-10-23 ENCOUNTER — Encounter (HOSPITAL_COMMUNITY): Payer: Self-pay | Admitting: Emergency Medicine

## 2023-10-23 ENCOUNTER — Other Ambulatory Visit: Payer: Self-pay

## 2023-10-23 DIAGNOSIS — M791 Myalgia, unspecified site: Secondary | ICD-10-CM | POA: Diagnosis present

## 2023-10-23 DIAGNOSIS — R110 Nausea: Secondary | ICD-10-CM | POA: Diagnosis not present

## 2023-10-23 DIAGNOSIS — Z5321 Procedure and treatment not carried out due to patient leaving prior to being seen by health care provider: Secondary | ICD-10-CM | POA: Diagnosis not present

## 2023-10-23 LAB — HCG, SERUM, QUALITATIVE: Preg, Serum: NEGATIVE

## 2023-10-23 LAB — CBC
HCT: 39 % (ref 36.0–46.0)
Hemoglobin: 12.7 g/dL (ref 12.0–15.0)
MCH: 28.9 pg (ref 26.0–34.0)
MCHC: 32.6 g/dL (ref 30.0–36.0)
MCV: 88.8 fL (ref 80.0–100.0)
Platelets: 284 10*3/uL (ref 150–400)
RBC: 4.39 MIL/uL (ref 3.87–5.11)
RDW: 14.7 % (ref 11.5–15.5)
WBC: 7.2 10*3/uL (ref 4.0–10.5)
nRBC: 0 % (ref 0.0–0.2)

## 2023-10-23 LAB — COMPREHENSIVE METABOLIC PANEL
ALT: 15 U/L (ref 0–44)
AST: 17 U/L (ref 15–41)
Albumin: 3.5 g/dL (ref 3.5–5.0)
Alkaline Phosphatase: 59 U/L (ref 38–126)
Anion gap: 8 (ref 5–15)
BUN: 8 mg/dL (ref 6–20)
CO2: 25 mmol/L (ref 22–32)
Calcium: 8.8 mg/dL — ABNORMAL LOW (ref 8.9–10.3)
Chloride: 104 mmol/L (ref 98–111)
Creatinine, Ser: 0.9 mg/dL (ref 0.44–1.00)
GFR, Estimated: >60 mL/min (ref >60)
Glucose, Bld: 100 mg/dL — ABNORMAL HIGH (ref 70–99)
Potassium: 3.9 mmol/L (ref 3.5–5.1)
Sodium: 137 mmol/L (ref 135–145)
Total Bilirubin: 0.4 mg/dL (ref 0.0–1.2)
Total Protein: 7 g/dL (ref 6.5–8.1)

## 2023-10-23 LAB — LIPASE, BLOOD: Lipase: 28 U/L (ref 11–51)

## 2023-10-23 NOTE — ED Triage Notes (Signed)
 Pt BIB EMS from home for nausea, vomiting, body aches. States that she was called in an abx today but was unable to pick it up. Dx with Klebsiella on 1/7.

## 2023-10-23 NOTE — ED Notes (Signed)
 Patient left.

## 2023-10-23 NOTE — ED Provider Triage Note (Signed)
 Emergency Medicine Provider Triage Evaluation Note  Nicole [name] , a 33 y.o. female  was evaluated in triage.  Pt complains of generalized body aches.  Reports associated nausea.  States that she was called in an antibiotic today, no records found in brief chart review of this.  Review of Systems  Positive: Body aches Negative: fever  Physical Exam  BP 113/88 (BP Location: Right Arm)   Pulse 91   Temp 98.9 F (37.2 C) (Oral)   Resp 19   Ht 5' 1 (1.549 m)   Wt 92.5 kg   LMP 09/23/2023 (Exact Date)   SpO2 100%   BMI 38.55 kg/m  Gen:   Awake, no distress   Resp:  Normal effort  MSK:   Moves extremities without difficulty  Other:    Medical Decision Making  Medically screening exam initiated at 1:55 AM.  Appropriate orders placed.  Nicole [name] was informed that the remainder of the evaluation will be completed by another provider, this initial triage assessment does not replace that evaluation, and the importance of remaining in the ED until their evaluation is complete.     Vicky Charleston, PA-C 10/23/23 0201

## 2023-10-24 ENCOUNTER — Ambulatory Visit (HOSPITAL_COMMUNITY)
Admission: EM | Admit: 2023-10-24 | Discharge: 2023-10-24 | Disposition: A | Discharge status: Home / Self Care | Payer: Medicare HMO | Attending: Behavioral Health | Admitting: Behavioral Health

## 2023-10-24 DIAGNOSIS — Z5901 Sheltered homelessness: Secondary | ICD-10-CM | POA: Insufficient documentation

## 2023-10-24 DIAGNOSIS — F339 Major depressive disorder, recurrent, unspecified: Secondary | ICD-10-CM | POA: Diagnosis not present

## 2023-10-24 DIAGNOSIS — Z79899 Other long term (current) drug therapy: Secondary | ICD-10-CM | POA: Insufficient documentation

## 2023-10-24 DIAGNOSIS — F209 Schizophrenia, unspecified: Secondary | ICD-10-CM

## 2023-10-24 DIAGNOSIS — F79 Unspecified intellectual disabilities: Secondary | ICD-10-CM | POA: Diagnosis not present

## 2023-10-24 DIAGNOSIS — Z5181 Encounter for therapeutic drug level monitoring: Secondary | ICD-10-CM | POA: Insufficient documentation

## 2023-10-24 DIAGNOSIS — F419 Anxiety disorder, unspecified: Secondary | ICD-10-CM | POA: Diagnosis not present

## 2023-10-24 NOTE — Discharge Instructions (Addendum)
Discharge recommendations:   Outpatient Follow up: Please review list of outpatient resources for psychiatry and counseling. Please follow up with your primary care provider for all medical related needs.   You are encouraged to follow up with [facility] for outpatient treatment.  Walk in/ Open Access Hours: Monday - Friday 8AM - 11AM (To see provider)  [facility] [address] [place], [place] [phone]  Therapy: We recommend that patient participate in individual therapy to address mental health concerns.  Safety:   The following safety precautions should be taken:   No sharp objects. This includes scissors, razors, scrapers, and putty knives.   Chemicals should be removed and locked up.   Medications should be removed and locked up.   Weapons should be removed and locked up. This includes firearms, knives and instruments that can be used to cause injury.   The patient should abstain from use of illicit substances/drugs and abuse of any medications.  If symptoms worsen or do not continue to improve or if the patient becomes actively suicidal or homicidal then it is recommended that the patient return to the closest hospital emergency department, the [facility], or call 911 for further evaluation and treatment. National Suicide Prevention Lifeline 1-800-SUICIDE or 4358596331.  About 988 988 offers 24/7 access to trained crisis counselors who can help people experiencing mental health-related distress. People can call or text 988 or chat 988lifeline.org for themselves or if they are worried about a loved one who may need crisis support.

## 2023-10-24 NOTE — ED Provider Notes (Signed)
 Behavioral Health Urgent Care Medical Screening Exam  Patient Name: Nicole [name] MRN: [id number] Date of Evaluation: 10/24/23 Chief Complaint:  need to get back on my medications Diagnosis:  Final diagnoses:  Encounter for medication management    History of Present illness: Nicole [name] is a 33 y.o. female patient with a past psychiatric history of borderline IDD, MDD, anxiety and schizophrenia who presented to the [facility] behavioral health urgent care voluntary accompanied by law enforcement requesting to get back on her medications.   Patient states that she needs a psychiatrist so that she can get back on her medications. She states that she was previously taking Abilify  injection, Seroquel  and trazodone . She states that she has been taking old medications of the Seroquel  and trazodone  up until a day ago. She states that she has not had her Abilify  injection in 3 to 4 months. She states that she has been experiencing frequent mood changes of feeling upset, blanking out and happy. She denies SI/HI/AVH. There is no objective evidence that the patient is currently responding to internal or external stimuli. She reports good sleep. She states that her appetite has been varying lately she has been sick and is prescribed an antibiotic. She denies using illicit drugs or alcohol. She gives verbal consent for this provider to speak with Tammy who oversees the transitional housing program and at the [place] [facility] where the patient currently resides. I spoke to Tammy who states that the patient wanted to come for an evaluation this evening so that she can get back on her medications. She denies safety concerns with the patient returning back to the transition of housing program. She states that they do not provide transportation which is a barrier for the patient to get back on her medications. She states that she will make sure that the patient makes it to open access here at  the [facility] outpatient clinic next week to get establish for medication management. I discussed providing the patient with a bus pass today so that she has transportation to make it to open access next week. Safety planning completed. Patient discharged and transported back to the transitional housing facility via [employer]. I attempted to contact the patient's legal guardian/father Clem Daring [phone], no answer left hippa compliant voicemail.     Flowsheet Row ED from 10/23/2023 in [facility] Emergency Department at [facility] ED from 10/02/2023 in [facility] Emergency Department at [facility] ED from 09/15/2023 in [facility]  C-SSRS RISK CATEGORY No Risk No Risk Moderate Risk       Psychiatric Specialty Exam  Presentation  General Appearance:Casual  Eye Contact:Good  Speech:Clear and Coherent; Normal Rate  Speech Volume:Normal  Handedness:Right   Mood and Affect  Mood: Depressed  Affect: Congruent; Constricted   Thought Process  Thought Processes: Coherent  Descriptions of Associations:Intact  Orientation:Full (Time, Place and Person)  Thought Content:WDL  Diagnosis of Schizophrenia or Schizoaffective disorder in past: Yes   Hallucinations:None Patient reports hearing voices  telling her nobody cares and she should just go and die denies  Ideas of Reference:None  Suicidal Thoughts:Yes, Passive With Plan Without Intent; Without Plan  Homicidal Thoughts:No   Sensorium  Memory: Immediate Fair  Judgment: Intact  Insight: Fair   Chartered Certified Accountant: Fair  Attention Span: Good  Recall: Fair  Fund of Knowledge: Good  Language: Good   Psychomotor Activity  Psychomotor Activity: Normal   Assets  Assets: Desire for Improvement;  Housing   Sleep  Sleep: Poor   Physical Exam: Physical Exam Cardiovascular:     Rate and Rhythm: Normal rate.  Pulmonary:     Effort:  Pulmonary effort is normal.  Musculoskeletal:        General: Normal range of motion.     Cervical back: Normal range of motion.  Neurological:     Mental Status: She is alert and oriented to person, place, and time.    Review of Systems  Constitutional: Negative.   HENT: Negative.    Eyes: Negative.   Respiratory: Negative.    Cardiovascular: Negative.   Gastrointestinal: Negative.   Genitourinary: Negative.   Musculoskeletal: Negative.   Neurological: Negative.   Endo/Heme/Allergies: Negative.    Blood pressure 136/86, pulse 100, temperature 98.4 F (36.9 C), temperature source Oral, resp. rate 18, last menstrual period 09/23/2023, SpO2 100%. There is no height or weight on file to calculate BMI.  Musculoskeletal: Strength & Muscle Tone: within normal limits Gait & Station: normal Patient leans: N/A   [facility] MSE Discharge Disposition for Follow up and Recommendations: Based on my evaluation the patient does not appear to have an emergency medical condition and can be discharged with resources and follow up care in outpatient services for Medication Management  Discharge recommendations:   Outpatient Follow up: Please review list of outpatient resources for psychiatry and counseling. Please follow up with your primary care provider for all medical related needs.   You are encouraged to follow up with [facility] for outpatient treatment.  Walk in/ Open Access Hours: Monday - Friday 8AM - 11AM (To see provider)  [facility] [address] [place], [place] [phone]  Therapy: We recommend that patient participate in individual therapy to address mental health concerns.  Safety:   The following safety precautions should be taken:   No sharp objects. This includes scissors, razors, scrapers, and putty knives.   Chemicals should be removed and locked up.   Medications should be removed and locked up.   Weapons should be  removed and locked up. This includes firearms, knives and instruments that can be used to cause injury.   The patient should abstain from use of illicit substances/drugs and abuse of any medications.  If symptoms worsen or do not continue to improve or if the patient becomes actively suicidal or homicidal then it is recommended that the patient return to the closest hospital emergency department, the [facility], or call 911 for further evaluation and treatment. National Suicide Prevention Lifeline 1-800-SUICIDE or 539-648-3605.  About 988 988 offers 24/7 access to trained crisis counselors who can help people experiencing mental health-related distress. People can call or text 988 or chat 988lifeline.org for themselves or if they are worried about a loved one who may need crisis support.    Teresa Wyline CROME, NP 10/24/2023, 5:54 PM

## 2023-11-03 ENCOUNTER — Encounter: Payer: Medicare HMO | Admitting: Obstetrics and Gynecology

## 2023-11-03 ENCOUNTER — Ambulatory Visit (HOSPITAL_COMMUNITY)
Admission: EM | Admit: 2023-11-03 | Discharge: 2023-11-03 | Disposition: A | Discharge status: Home / Self Care | Payer: Medicare HMO | Attending: Nurse Practitioner | Admitting: Nurse Practitioner

## 2023-11-03 DIAGNOSIS — F419 Anxiety disorder, unspecified: Secondary | ICD-10-CM | POA: Insufficient documentation

## 2023-11-03 DIAGNOSIS — F209 Schizophrenia, unspecified: Secondary | ICD-10-CM | POA: Diagnosis not present

## 2023-11-03 DIAGNOSIS — Z5901 Sheltered homelessness: Secondary | ICD-10-CM | POA: Insufficient documentation

## 2023-11-03 DIAGNOSIS — Z760 Encounter for issue of repeat prescription: Secondary | ICD-10-CM | POA: Diagnosis present

## 2023-11-03 NOTE — ED Provider Notes (Signed)
Behavioral Health Urgent Care Medical Screening Exam  Patient Name: Nicole [name] MRN: [id number] Date of Evaluation: 11/03/23 Chief Complaint:  " I need my meds" Diagnosis:  Final diagnoses:  Medication refill    History of Present illness: Nicole [name] is a 33 y.o. female. with psychiatric history of  Border line IDD, Anxiety, schizophrenia unspecified, MDD with psychosis, who presented voluntarily to [facility]  accompanied by her  boyfriend Kevin Fenton, requesting medication refills.   Patient was seen face to face by this provider and chart reviewed.   Patient reports being off her medications for the past two months and can feel herself becoming angry to the point of ruining things. Patient reports she was on Seroquel, sertraline and some other medications she can't recall at this time.   Patient reports she has a virtual appointment with the [facility] outpatient clinic for the 25th of this month for medication management. Patient reports she is not established with a therapist.   Patient reports chronic auditory hallucinations of voices telling her she is not good enough and endorses seeing people who are not there at times. Patient denies any ongoing AH or VH.   Patient reports she currently lives in transitional housing for the past "couple of months". She denies access  or means to a gun Patient has history of inpatient psychiatric hospitalizations.   Support, encouragement and reassurance provided about ongoing stressors. Patient is provided with opportunity for questions.   On evaluation, patient is alert, oriented x 4, and cooperative. Speech is clear, normal rate and coherent. Pt appears fairly groomed. Eye contact is good. Mood is anxious, affect is congruent with mood. Thought process is coherent and thought content is WDL. Pt denies SI/HI/AVH. There is no objective indication that the patient is responding to internal stimuli. No delusions elicited during this assessment.     Discussed recommendation for discharged and follow up with [facility] outpatient walk-in clinic in the am for medication management. Patient verbalized her understanding and is in agreement.  Bus tickets for the am provided.   Flowsheet Row ED from 10/23/2023 in [facility] Emergency Department at [facility] ED from 10/02/2023 in [facility] Emergency Department at [facility] ED from 09/15/2023 in [facility]  C-SSRS RISK CATEGORY No Risk No Risk Moderate Risk       Psychiatric Specialty Exam  Presentation  General Appearance:Fairly Groomed  Eye Contact:Good  Speech:Clear and Coherent  Speech Volume:Normal  Handedness:Right   Mood and Affect  Mood: Anxious  Affect: Congruent   Thought Process  Thought Processes: Coherent  Descriptions of Associations:Intact  Orientation:Full (Time, Place and Person)  Thought Content:WDL  Diagnosis of Schizophrenia or Schizoaffective disorder in past: Yes   Hallucinations: None currently Ideas of Reference:None  Suicidal Thoughts:None Homicidal Thoughts:No   Sensorium  Memory: Immediate Fair  Judgment: Intact  Insight: Present   Executive Functions  Concentration: Good  Attention Span: Good  Recall: Good  Fund of Knowledge: Good  Language: Good   Psychomotor Activity  Psychomotor Activity: Normal   Assets  Assets: Communication Skills; Desire for Improvement; Social Support   Sleep  Sleep: Fair  Number of hours:  5   Physical Exam: Physical Exam Constitutional:      General: She is not in acute distress.    Appearance: She is not diaphoretic.  HENT:     Head: Normocephalic.     Right Ear: External ear normal.     Left Ear: External ear normal.  Nose: No congestion.  Eyes:     General:        Right eye: No discharge.        Left eye: No discharge.  Cardiovascular:     Rate and Rhythm: Normal rate.  Pulmonary:     Effort: No  respiratory distress.  Chest:     Chest wall: No tenderness.  Neurological:     Mental Status: She is alert and oriented to person, place, and time.  Psychiatric:        Attention and Perception: Attention and perception normal.        Mood and Affect: Mood is anxious.        Speech: Speech normal.        Behavior: Behavior is cooperative.        Thought Content: Thought content normal.        Cognition and Memory: Cognition and memory normal.    Review of Systems  Constitutional:  Negative for chills, fever and malaise/fatigue.  HENT:  Negative for congestion.   Eyes:  Negative for discharge.  Respiratory:  Negative for cough and shortness of breath.   Cardiovascular:  Negative for chest pain and palpitations.  Gastrointestinal:  Negative for diarrhea, nausea and vomiting.  Neurological:  Negative for dizziness, seizures, loss of consciousness, weakness and headaches.  Psychiatric/Behavioral:  The patient is nervous/anxious.    Blood pressure (!) 136/98, pulse 90, temperature 97.6 F (36.4 C), temperature source Oral, resp. rate 18, SpO2 100%. There is no height or weight on file to calculate BMI.  Musculoskeletal: Strength & Muscle Tone: within normal limits Gait & Station: normal Patient leans: N/A   [facility] MSE Discharge Disposition for Follow up and Recommendations: Based on my evaluation the patient does not appear to have an emergency medical condition and can be discharged with resources and follow up care in outpatient services for Medication Management  Recommend discharge and follow up with [facility] outpatient walk in clinic in the am for medication management.   Patient denies SI/HI/AVH or paranoia. Patient does not meet inpatient psychiatric admission criteria criteria or IVC criteria at this time. There is no evidence of imminent risk of harm to self or others.   Discharge recommendations:  Please follow up with your primary care provider for all medical related needs.    Safety:  The patient should abstain from use of illicit substances/drugs and abuse of any medications. If symptoms worsen or do not continue to improve or if the patient becomes actively suicidal or homicidal then it is recommended that the patient return to the closest hospital emergency department, the [facility], or call 911 for further evaluation and treatment. National Suicide Prevention Lifeline 1-800-SUICIDE or (204)491-0499.  About 988 988 offers 24/7 access to trained crisis counselors who can help people experiencing mental health-related distress. People can call or text 988 or chat 988lifeline.org for themselves or if they are worried about a loved one who may need crisis support.  Crisis Mobile: [facility]:                     215-456-9042 (for crisis response 24 hours a day) [facility] Hotline:                                            629-016-0735   Patient discharged home in stable  condition.   Mancel Bale, NP 11/03/2023, 8:53 PM

## 2023-11-03 NOTE — Discharge Instructions (Signed)

## 2023-11-07 ENCOUNTER — Telehealth (INDEPENDENT_AMBULATORY_CARE_PROVIDER_SITE_OTHER): Payer: Medicare HMO | Admitting: Psychiatry

## 2023-11-07 ENCOUNTER — Encounter (HOSPITAL_COMMUNITY): Payer: Self-pay | Admitting: Psychiatry

## 2023-11-07 DIAGNOSIS — F250 Schizoaffective disorder, bipolar type: Secondary | ICD-10-CM | POA: Diagnosis not present

## 2023-11-07 DIAGNOSIS — F411 Generalized anxiety disorder: Secondary | ICD-10-CM

## 2023-11-07 DIAGNOSIS — F99 Mental disorder, not otherwise specified: Secondary | ICD-10-CM | POA: Diagnosis not present

## 2023-11-07 DIAGNOSIS — F5105 Insomnia due to other mental disorder: Secondary | ICD-10-CM

## 2023-11-07 MED ORDER — ABILIFY MAINTENA 400 MG IM PRSY: 400.0000 mg | PREFILLED_SYRINGE | INTRAMUSCULAR | 11 refills | Status: DC

## 2023-11-07 MED ORDER — SERTRALINE HCL 50 MG PO TABS: 50.0000 mg | ORAL_TABLET | Freq: Every day | ORAL | 3 refills | Status: DC

## 2023-11-07 MED ORDER — ARIPIPRAZOLE 10 MG PO TABS: 10.0000 mg | ORAL_TABLET | Freq: Every day | ORAL | 0 refills | Status: DC

## 2023-11-07 MED ORDER — TRAZODONE HCL 50 MG PO TABS: 50.0000 mg | ORAL_TABLET | Freq: Every evening | ORAL | 3 refills | Status: DC | PRN

## 2023-11-07 MED ORDER — ARIPIPRAZOLE ER 400 MG IM PRSY
400.0000 mg | PREFILLED_SYRINGE | INTRAMUSCULAR | Status: AC
  Administered 2023-11-13 – 2024-10-27 (×8): 400 mg via INTRAMUSCULAR

## 2023-11-07 NOTE — Progress Notes (Signed)
BH MD/PA/NP OP Progress Note Virtual Visit via Video Note  I connected with Nicole [name] on 11/07/23 at  8:00 AM EST by a video enabled telemedicine application and verified that I am speaking with the correct person using two identifiers.  Location: Patient: Home Provider: Clinic   I discussed the limitations of evaluation and management by telemedicine and the availability of in person appointments. The patient expressed understanding and agreed to proceed.  I provided 30 minutes of non-face-to-face time during this encounter.   11/07/2023 8:59 AM Nicole [name]  MRN:  [id number]  Chief Complaint: "I need my injection and I have been depressed"  HPI: 33 year old female seen today for follow-up psychiatric evaluation.  She has a psychiatric history of schizophrenia, depression, anxiety, insomnia, tobacco use, and borderline intellectual functioning.  Patient has not been seen in the clinic for over 6 months. Patient was seen in [facility] multiple times over the last few months. She was last seen on 11/03/2023 requesting refills and presenting with chronic hallucinations. Patient was last prescribed Abilify 400 mg monthly, Trileptal 300 mg twice daily, Zoloft 150 mg daily, trazodone 100 mg nightly as needed, and Trileptal 300 mg 3 times daily when seen at [facility] outpatient clinic.   Today she is well-groomed, pleasant, cooperative, and engaged in conversation.  Patient informed Clinical research associate that she would like to restart her medications.  She notes that she does not feel mentally stable since being off her medications. Patient notes that her anxiety and depression has worsened. She also note that she is having AH. Patient notes that she constantly hear voices who of people who holds conversations with her. Patient denies VH. Since her last visit she notes that she has been irritable, having racing thoughts, fluctuations in mood, and impulsive spending.   Patient notes that she is worried about her  health, moving, and life in general. Today provider conducted a GAD 7 and patient scored a 21. Provider also conducted a PHQ 9 and patient scored a 19.  She reports that her appetite is adequate.  Patient notes that her sleep fluctuates.  She notes that she goes from not sleeping at all to sleeping over 12 hours.  Today she endorse passive SI but denies wanting to harm herself.  She denies SI/HI or paranoia.  Patient denies tobacco or illegal drug use.  She does note that she drinks alcohol socially.  Patient informed writer that she would like to restart her LAI.  Today provider ordered Abilify 10 mg to be taken for a week.  If tolerated patient will receive her first Abilify Maintena injection on 11/13/2023.  Patient also agreeable to restarting Zoloft 50 mg to help manage anxiety and depression and trazodone 50 mg to help manage sleep. No other concerns noted at this time.  Visit Diagnosis:    ICD-10-CM   1. Schizoaffective disorder, bipolar type (HCC)  F25.0 ARIPiprazole ER (ABILIFY MAINTENA) 400 MG PRSY prefilled syringe    ARIPiprazole ER (ABILIFY MAINTENA) 400 MG prefilled syringe 400 mg    ARIPiprazole (ABILIFY) 10 MG tablet    2. Insomnia due to other mental disorder  F51.05 traZODone (DESYREL) 50 MG tablet   F99     3. Anxiety state  F41.1 sertraline (ZOLOFT) 50 MG tablet       Past Psychiatric History: Schizophrenia (unspecified type), Major depressive disorder, Patient reports that she has been hospitalized due to mental health multiple times.  Patient states that she has done stints at [facility] and [address]  Past Medical History:  Past Medical History:  Diagnosis Date   Anxiety    Depression    Hypothyroidism 08/07/2022   Tobacco use disorder 08/07/2022    Past Surgical History:  Procedure Laterality Date   WISDOM TOOTH EXTRACTION Bilateral 2020    Family Psychiatric History: mother attempted suicide, Patient reports that her brother used to abuse marijuana and alcohol.  Patient states that her mother used to abuse marijuana and alcohol.   Family History:  Family History  Problem Relation Age of Onset   Hypertension Father    Diabetes Father     Social History:  Social History   Socioeconomic History   Marital status: Single    Spouse name: Not on file   Number of children: Not on file   Years of education: Not on file   Highest education level: Not on file  Occupational History   Not on file  Tobacco Use   Smoking status: Every Day    Types: Cigarettes   Smokeless tobacco: Never  Vaping Use   Vaping status: Some Days  Substance and Sexual Activity   Alcohol use: Yes    Comment: weekly   Drug use: Never   Sexual activity: Yes    Partners: Female    Birth control/protection: Implant  Other Topics Concern   Not on file  Social History Narrative   Not on file   Social Drivers of Health   Financial Resource Strain: Low Risk  (12/13/2022)   Overall Financial Resource Strain (CARDIA)    Difficulty of Paying Living Expenses: Not hard at all  Food Insecurity: No Food Insecurity (12/13/2022)   Hunger Vital Sign    Worried About Running Out of Food in the Last Year: Never true    Ran Out of Food in the Last Year: Never true  Transportation Needs: Unmet Transportation Needs (03/03/2023)   PRAPARE - Transportation    Lack of Transportation (Medical): Yes    Lack of Transportation (Non-Medical): Yes  Physical Activity: Inactive (12/13/2022)   Exercise Vital Sign    Days of Exercise per Week: 0 days    Minutes of Exercise per Session: 0 min  Stress: Stress Concern Present (12/13/2022)   [employer] of Occupational Health - Occupational Stress Questionnaire    Feeling of Stress : Very much  Social Connections: Socially Integrated (12/13/2022)   Social Connection and Isolation Panel [NHANES]    Frequency of Communication with Friends and Family: More than three times a week    Frequency of Social Gatherings with Friends and Family: Three times  a week    Attends Religious Services: More than 4 times per year    Active Member of Clubs or Organizations: Yes    Attends Banker Meetings: More than 4 times per year    Marital Status: Living with partner    Allergies:  Allergies  Allergen Reactions   Apple Juice Anaphylaxis   Other Anaphylaxis and Other (See Comments)    SALMON   Watermelon Flavoring Agent (Non-Screening) Anaphylaxis    Metabolic Disorder Labs: Lab Results  Component Value Date   HGBA1C 5.7 (H) 10/15/2022   MPG 117 10/15/2022   MPG 96.8 06/07/2022   No results found for: "PROLACTIN" Lab Results  Component Value Date   CHOL 181 07/25/2022   TRIG 118 07/25/2022   HDL 45 07/25/2022   CHOLHDL 4.0 07/25/2022   VLDL 24 07/25/2022   LDLCALC 112 (H) 07/25/2022   LDLCALC 88 06/07/2022   Lab  Results  Component Value Date   TSH 0.793 09/05/2022   TSH 6.668 (H) 07/25/2022    Therapeutic Level Labs: No results found for: "LITHIUM" No results found for: "VALPROATE" No results found for: "CBMZ"  Current Medications: Current Outpatient Medications  Medication Sig Dispense Refill   ARIPiprazole (ABILIFY) 10 MG tablet Take 1 tablet (10 mg total) by mouth daily. 7 tablet 0   ARIPiprazole ER (ABILIFY MAINTENA) 400 MG PRSY prefilled syringe Inject 400 mg into the muscle every 28 (twenty-eight) days. 1 each 11   Alcohol Swabs (DROPSAFE ALCOHOL PREP) 70 % PADS Apply topically.     fluticasone (FLONASE) 50 MCG/ACT nasal spray Place 1 spray into both nostrils daily.     levothyroxine (SYNTHROID) 100 MCG tablet Take 1 tablet (100 mcg total) by mouth daily at 6 (six) AM. 30 tablet 0   metFORMIN (GLUCOPHAGE) 500 MG tablet Take 1 tablet (500 mg total) by mouth daily with breakfast. 30 tablet 0   ondansetron (ZOFRAN) 4 MG tablet Take 1 tablet (4 mg total) by mouth every 6 (six) hours. 12 tablet 0   pantoprazole (PROTONIX) 40 MG tablet Take 1 tablet (40 mg total) by mouth daily. 30 tablet 0   sertraline  (ZOLOFT) 50 MG tablet Take 1 tablet (50 mg total) by mouth daily. 30 tablet 3   traZODone (DESYREL) 50 MG tablet Take 1 tablet (50 mg total) by mouth at bedtime as needed for sleep. 30 tablet 3   triamcinolone (NASACORT) 55 MCG/ACT AERO nasal inhaler Place 2 sprays into the nose daily. 1 each 0   TRUE METRIX BLOOD GLUCOSE TEST test strip 1 each by Other route as needed (For blood sugar testing).     TRUEplus Lancets 33G MISC      valACYclovir (VALTREX) 500 MG tablet Take 500 mg by mouth daily.     Current Facility-Administered Medications  Medication Dose Route Frequency Provider Last Rate Last Admin   [START ON 11/13/2023] ARIPiprazole ER (ABILIFY MAINTENA) 400 MG prefilled syringe 400 mg  400 mg Intramuscular Q28 days          Musculoskeletal: Strength & Muscle Tone: within normal limits and telehealth visit Gait & Station: normal, telehealth visit Patient leans: N/A  Psychiatric Specialty Exam: Review of Systems  There were no vitals taken for this visit.There is no height or weight on file to calculate BMI.  General Appearance: Well Groomed  Eye Contact:  Good  Speech:  Clear and Coherent and Slow  Volume:  Normal  Mood:  Anxious and Depressed  Affect:  Depressed and Flat  Thought Process:  Coherent, Goal Directed, and Linear  Orientation:  Full (Time, Place, and Person)  Thought Content: Logical and Hallucinations: Auditory   Suicidal Thoughts:  Yes.  without intent/plan  Homicidal Thoughts:  No  Memory:  Immediate;   Good Recent;   Good Remote;   Good  Judgement:  Good  Insight:  Good  Psychomotor Activity:  Normal  Concentration:  Concentration: Good and Attention Span: Good  Recall:  Good  Fund of Knowledge: Good  Language: Good  Akathisia:  No  Handed:  Right  AIMS (if indicated): not done  Assets:  Communication Skills Desire for Improvement Housing Leisure Time Physical Health Social Support  ADL's:  Intact  Cognition: WNL  Sleep:  Fair    Screenings: AIMS    Flowsheet Row Office Visit from 01/30/2023 in [facility]  AIMS Total Score 2      GAD-7  Flowsheet Row Video Visit from 11/07/2023 in [facility] Counselor from 03/20/2023 in [facility] Office Visit from 01/30/2023 in [facility] Office Visit from 12/04/2022 in [facility] Office Visit from 12/17/2021 in [facility] for [facility] [place]  Total GAD-7 Score 21 16 21 21 17       [id number]    Flowsheet Row Video Visit from 11/07/2023 in [facility] Counselor from 03/20/2023 in [facility] Office Visit from 01/30/2023 in [facility] Office Visit from 12/04/2022 in [facility] ED from 10/07/2022 in [facility]  PHQ-2 Total Score 6 2 3 4  0  PHQ-9 Total Score -- 14 16 20  --      Flowsheet Row Video Visit from 11/07/2023 in [facility] ED from 11/03/2023 in [facility] ED from 10/23/2023 in [facility] Emergency Department at [facility]  C-SSRS RISK CATEGORY Error: Q7 should not be populated when Q6 is No Low Risk No Risk        Assessment and Plan: Patient endorses symptoms of insomnia/hypersomnia, hypomania, anxiety and depression.  Patient informed writer that she would like to restart her LAI.  Today provider ordered Abilify 10 mg to be taken for a week.  If tolerated patient will receive her first Abilify Maintena injection on 11/13/2023.  Patient also agreeable to restarting Zoloft 50 mg to help manage anxiety and depression and trazodone 50 mg to help manage sleep.    1. Insomnia due to other mental disorder  Restart- traZODone (DESYREL) 50 MG tablet; Take 1 tablet (50 mg total) by mouth at bedtime as needed  for sleep.  Dispense: 30 tablet; Refill: 3  2. Anxiety state  Restart- sertraline (ZOLOFT) 50 MG tablet; Take 1 tablet (50 mg total) by mouth daily.  Dispense: 30 tablet; Refill: 3  3. Schizoaffective disorder, bipolar type (HCC) (Primary)  Restart- ARIPiprazole ER (ABILIFY MAINTENA) 400 MG PRSY prefilled syringe; Inject 400 mg into the muscle every 28 (twenty-eight) days.  Dispense: 1 each; Refill: 11 Restart- ARIPiprazole ER (ABILIFY MAINTENA) 400 MG prefilled syringe 400 mg Restart- ARIPiprazole (ABILIFY) 10 MG tablet; Take 1 tablet (10 mg total) by mouth daily.  Dispense: 7 tablet; Refill: 0  Collaboration of Care: Collaboration of Care: Other provider involved in patient's care AEB counselor, PCP, shock clinic staff  Patient/Guardian was advised Release of Information must be obtained prior to any record release in order to collaborate their care with an outside provider. Patient/Guardian was advised if they have not already done so to contact the registration department to sign all necessary forms in order for [place] to release information regarding their care.   Consent: Patient/Guardian gives verbal consent for treatment and assignment of benefits for services provided during this visit. Patient/Guardian expressed understanding and agreed to proceed.   Follow-up in 2-1/2 months Follow-up with shot clinic in 1 month Shanna Cisco, NP 11/07/2023, 8:59 AM

## 2023-11-13 ENCOUNTER — Ambulatory Visit (INDEPENDENT_AMBULATORY_CARE_PROVIDER_SITE_OTHER): Payer: Medicare HMO | Admitting: *Deleted

## 2023-11-13 ENCOUNTER — Encounter (HOSPITAL_COMMUNITY): Payer: Self-pay

## 2023-11-13 VITALS — BP 111/81 | HR 102 | Resp 16 | Ht 61.0 in | Wt 200.8 lb

## 2023-11-13 DIAGNOSIS — F250 Schizoaffective disorder, bipolar type: Secondary | ICD-10-CM | POA: Diagnosis not present

## 2023-11-13 NOTE — Progress Notes (Cosign Needed)
Patient arrived for her injection of Abilify Maintena 400mg  that was delivered by her pharmacy. States she has been taking her oral Abilify in preparation for her injection and has had no side effects. She was wearing a shirt that was difficult to remove so she opted to get her injection in her Right Lower outer Quadrant. Given with no complaints or issues. Will return in 28 days.

## 2023-11-25 ENCOUNTER — Ambulatory Visit (HOSPITAL_COMMUNITY)
Admission: EM | Admit: 2023-11-25 | Discharge: 2023-11-25 | Disposition: A | Discharge status: Home / Self Care | Payer: Medicare HMO | Attending: Emergency Medicine | Admitting: Emergency Medicine

## 2023-11-25 ENCOUNTER — Encounter (HOSPITAL_COMMUNITY): Payer: Self-pay

## 2023-11-25 DIAGNOSIS — N898 Other specified noninflammatory disorders of vagina: Secondary | ICD-10-CM | POA: Insufficient documentation

## 2023-11-25 DIAGNOSIS — Z113 Encounter for screening for infections with a predominantly sexual mode of transmission: Secondary | ICD-10-CM | POA: Insufficient documentation

## 2023-11-25 DIAGNOSIS — R1030 Lower abdominal pain, unspecified: Secondary | ICD-10-CM | POA: Insufficient documentation

## 2023-11-25 DIAGNOSIS — Z3202 Encounter for pregnancy test, result negative: Secondary | ICD-10-CM

## 2023-11-25 DIAGNOSIS — M545 Low back pain, unspecified: Secondary | ICD-10-CM | POA: Diagnosis present

## 2023-11-25 LAB — POCT URINALYSIS DIP (MANUAL ENTRY)
Bilirubin, UA: NEGATIVE
Blood, UA: NEGATIVE
Glucose, UA: NEGATIVE mg/dL
Ketones, POC UA: NEGATIVE mg/dL
Nitrite, UA: NEGATIVE
Protein Ur, POC: NEGATIVE mg/dL
Spec Grav, UA: 1.02 (ref 1.010–1.025)
Urobilinogen, UA: 0.2 U/dL
pH, UA: 7 (ref 5.0–8.0)

## 2023-11-25 LAB — HIV ANTIBODY (ROUTINE TESTING W REFLEX): HIV Screen 4th Generation wRfx: NONREACTIVE

## 2023-11-25 LAB — POCT URINE PREGNANCY: Preg Test, Ur: NEGATIVE

## 2023-11-25 MED ORDER — NAPROXEN 500 MG PO TABS: 500.0000 mg | ORAL_TABLET | Freq: Two times a day (BID) | ORAL | 0 refills | Status: DC

## 2023-11-25 MED ORDER — LIDOCAINE 5 % EX PTCH: 1.0000 | MEDICATED_PATCH | CUTANEOUS | 0 refills | Status: DC

## 2023-11-25 NOTE — ED Triage Notes (Signed)
Chief Complaint: Back and abdominal pain. No NVD or constipation. Patient's urine has a slight odor. No vaginal pain or discharge.  Sick exposure: No  Onset: 2 weeks  Prescriptions or OTC medications tried: Yes- Ibuprofen    with no relief  New foods, medications, or products: Yes- Patient has been losing weight without really trying. No dietary or medications  Recent Travel: No

## 2023-11-25 NOTE — ED Provider Notes (Signed)
[facility]    CSN: [id number] Arrival date & time: 11/25/23  1246      History   Chief Complaint Chief Complaint  Patient presents with   Abdominal Pain   Back Pain    HPI Nicole [name] is a 33 y.o. female.   Patient presents with low midline back pain and lower abdominal pain x 2 weeks.  Patient endorses some increased vaginal discharge. Denies nausea, vomiting, diarrhea, constipation, dysuria, hematuria, and fever.  Denies any known injury to back.   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 Authorizing Provider  lidocaine (LIDODERM) 5 % Place 1 patch onto the skin daily. Remove & Discard patch within 12 hours or as directed by MD 11/25/23  Yes Wynonia Lawman A, NP  naproxen (NAPROSYN) 500 MG tablet Take 1 tablet (500 mg total) by mouth 2 (two) times daily. 11/25/23  Yes Wynonia Lawman A, NP  Alcohol Swabs (DROPSAFE  ALCOHOL PREP) 70 % PADS Apply topically. 11/29/22  Yes [provider]  ARIPiprazole ER (ABILIFY MAINTENA) 400 MG PRSY prefilled syringe Inject 400 mg into the muscle every 28 (twenty-eight) days. 11/07/23  Yes Toy Cookey E, NP  fluticasone (FLONASE) 50 MCG/ACT nasal spray Place 1 spray into both nostrils daily. 11/29/22  Yes [provider]  ondansetron (ZOFRAN) 4 MG tablet Take 1 tablet (4 mg total) by mouth every 6 (six) hours. 05/30/23  Yes Wynetta Fines, MD  pantoprazole (PROTONIX) 40 MG tablet Take 1 tablet (40 mg total) by mouth daily. 11/23/22 01/30/23  Lauree Chandler, NP  sertraline (ZOLOFT) 50 MG tablet Take 1 tablet (50 mg total) by mouth daily. 11/07/23  Yes Toy Cookey E, NP  traZODone (DESYREL) 50 MG tablet Take 1 tablet (50 mg total) by mouth at bedtime as needed for sleep. 11/07/23  Yes Shanna Cisco, NP  TRUE METRIX BLOOD GLUCOSE TEST test strip 1 each by Other route as needed (For blood sugar testing). 11/29/22  Yes [provider]  TRUEplus Lancets 33G MISC  11/29/22  Yes [provider]  valACYclovir (VALTREX) 500 MG tablet Take 500 mg by mouth daily.   Yes  [provider]    Family History Family History  Problem Relation Age of Onset   Hypertension Father    Diabetes Father     Social History Social History   Tobacco Use   Smoking status: Every Day    Types: Cigarettes   Smokeless tobacco: Never  Vaping Use   Vaping status: Some Days  Substance Use Topics   Alcohol use: Yes    Comment: weekly   Drug use: Never     Allergies   Apple juice, Other, and Watermelon flavoring agent (non-screening)   Review of Systems Review of Systems  Gastrointestinal:  Positive for abdominal pain.  Musculoskeletal:  Positive for back pain.   Per HPI  Physical Exam Triage Vital Signs ED Triage Vitals  Encounter Vitals Group     BP 11/25/23 1442 123/82     Systolic BP Percentile --      Diastolic BP  Percentile --      Pulse Rate 11/25/23 1442 78     Resp 11/25/23 1442 16     Temp 11/25/23 1442 98.1 F (36.7 C)     Temp Source 11/25/23 1442 Oral     SpO2 11/25/23 1442 98 %     Weight 11/25/23 1442 157 lb (71.2 kg)     Height 11/25/23 1442 5\' 1"  (1.549 m)     Head Circumference --      Peak Flow --      Pain Score 11/25/23 1439 7     Pain Loc --      Pain Education --      Exclude from Growth Chart --    No data found.  Updated Vital Signs BP 123/82 (BP Location: Left Arm)   Pulse 78   Temp 98.1 F (36.7 C) (Oral)   Resp 16   Ht 5\' 1"  (1.549 m)   Wt 157 lb (71.2 kg)   LMP 10/23/2023 (Approximate)   SpO2 98%   BMI 29.66 kg/m   Visual Acuity Right Eye Distance:   Left Eye Distance:   Bilateral Distance:    Right Eye Near:   Left Eye Near:    Bilateral Near:     Physical Exam Vitals and nursing note reviewed.  Constitutional:      General: She is awake. She is not in acute distress.    Appearance: Normal appearance. She is well-developed and well-groomed. She is not ill-appearing.  Cardiovascular:     Rate and Rhythm: Normal rate and regular rhythm.  Pulmonary:     Effort: Pulmonary effort is normal.     Breath sounds: Normal breath sounds.  Abdominal:     General: Abdomen is flat. Bowel sounds are normal.     Tenderness: There is abdominal tenderness in the suprapubic area and left lower quadrant. There is no right CVA tenderness or left CVA tenderness.  Musculoskeletal:     Lumbar back: Tenderness present. No swelling, edema, deformity or bony tenderness. Normal range of motion.  Neurological:     Mental Status: She is alert.  Psychiatric:        Behavior: Behavior is cooperative.      UC Treatments / Results  Labs (all labs ordered are listed, but only abnormal results are displayed) Labs Reviewed  POCT URINALYSIS DIP (MANUAL ENTRY) - Abnormal; Notable for the following components:      Result Value   Clarity, UA hazy (*)    Leukocytes, UA  Trace (*)    All other components within  normal limits  HIV ANTIBODY (ROUTINE TESTING W REFLEX)  RPR  POCT URINE PREGNANCY  CERVICOVAGINAL ANCILLARY ONLY    EKG   Radiology No results found.  Procedures Procedures (including critical care time)  Medications Ordered in UC Medications - No data to display  Initial Impression / Assessment and Plan / UC Course  I have reviewed the triage vital signs and the nursing notes.  Pertinent labs & imaging results that were available during my care of the patient were reviewed by me and considered in my medical decision making (see chart for details).     Patient presented with 2-week history of low midline back pain and lower abdominal pain.  Patient also endorses some increased vaginal discharge and is requesting STD testing.  Patient reports noticing a foul odor to her urine.  Upon assessment patient has mild muscular tenderness to low back without bony tenderness.  Patient also endorses mild tenderness to suprapubic region and left lower quadrant, without guarding or rebound tenderness.  UA revealed trace leukocytes in urine, unlikely related to UTI.  GU exam deferred.  Patient perform self swab for STD/STI.  HIV and syphilis testing ordered.  Urine pregnancy was negative.  Prescribed lidocaine patches and naproxen as needed for back pain.  Discussed follow-up and return precautions. Final Clinical Impressions(s) / UC Diagnoses   Final diagnoses:  Acute midline low back pain without sciatica  Lower abdominal pain  Vaginal discharge  Screening for STD (sexually transmitted disease)     Discharge Instructions      Your pregnancy test was negative. Your other results will come back over the next few days and someone will call if results are positive and require treatment.  You can take Naproxen twice daily as needed for pain. Do not take other NSAIDs with this such as, ibuprofen, motrin, Advil, Aleve. You can alternate with  Tylenol. You can also apply lidocaine patches to back as needed. Return here as needed.      ED Prescriptions     Medication Sig Dispense Auth. Provider   naproxen (NAPROSYN) 500 MG tablet Take 1 tablet (500 mg total) by mouth 2 (two) times daily. 30 tablet Susann Givens, Areana Kosanke A, NP   lidocaine (LIDODERM) 5 % Place 1 patch onto the skin daily. Remove & Discard patch within 12 hours or as directed by MD 30 patch Letta Kocher, NP      PDMP not reviewed this encounter.   Wynonia Lawman A, NP 11/25/23 [phone]

## 2023-11-25 NOTE — Discharge Instructions (Signed)
Your pregnancy test was negative. Your other results will come back over the next few days and someone will call if results are positive and require treatment.  You can take Naproxen twice daily as needed for pain. Do not take other NSAIDs with this such as, ibuprofen, motrin, Advil, Aleve. You can alternate with Tylenol. You can also apply lidocaine patches to back as needed. Return here as needed.

## 2023-11-26 LAB — CERVICOVAGINAL ANCILLARY ONLY
Bacterial Vaginitis (gardnerella): POSITIVE — AB
Candida Glabrata: POSITIVE — AB
Candida Vaginitis: POSITIVE — AB
Chlamydia: NEGATIVE
Comment: NEGATIVE
Comment: NEGATIVE
Comment: NEGATIVE
Comment: NEGATIVE
Comment: NEGATIVE
Comment: NORMAL
Neisseria Gonorrhea: NEGATIVE
Trichomonas: POSITIVE — AB

## 2023-11-26 LAB — RPR: RPR Ser Ql: NONREACTIVE

## 2023-11-27 ENCOUNTER — Telehealth: Payer: Self-pay

## 2023-11-27 MED ORDER — METRONIDAZOLE 500 MG PO TABS: 500.0000 mg | ORAL_TABLET | Freq: Two times a day (BID) | ORAL | 0 refills | Status: AC

## 2023-11-27 MED ORDER — FLUCONAZOLE 150 MG PO TABS: 150.0000 mg | ORAL_TABLET | Freq: Once | ORAL | 0 refills | Status: AC

## 2023-11-27 NOTE — Telephone Encounter (Signed)
Per protocol, pt requires tx with metronidazole and Diflucan. Reviewed with patient, verified pharmacy, prescription sent.

## 2023-11-28 ENCOUNTER — Encounter (HOSPITAL_COMMUNITY): Payer: Self-pay | Admitting: Emergency Medicine

## 2023-11-28 ENCOUNTER — Emergency Department (HOSPITAL_COMMUNITY)
Admission: EM | Admit: 2023-11-28 | Discharge: 2023-11-29 | Discharge status: Against Medical Advice | Payer: Medicare HMO | Attending: Emergency Medicine | Admitting: Emergency Medicine

## 2023-11-28 ENCOUNTER — Other Ambulatory Visit: Payer: Self-pay

## 2023-11-28 DIAGNOSIS — Z3202 Encounter for pregnancy test, result negative: Secondary | ICD-10-CM | POA: Diagnosis not present

## 2023-11-28 DIAGNOSIS — Z5321 Procedure and treatment not carried out due to patient leaving prior to being seen by health care provider: Secondary | ICD-10-CM | POA: Diagnosis not present

## 2023-11-28 DIAGNOSIS — R1084 Generalized abdominal pain: Secondary | ICD-10-CM | POA: Diagnosis present

## 2023-11-28 DIAGNOSIS — R109 Unspecified abdominal pain: Secondary | ICD-10-CM | POA: Diagnosis present

## 2023-11-28 LAB — URINALYSIS, ROUTINE W REFLEX MICROSCOPIC
Bilirubin Urine: NEGATIVE
Glucose, UA: NEGATIVE mg/dL
Hgb urine dipstick: NEGATIVE
Ketones, ur: NEGATIVE mg/dL
Nitrite: NEGATIVE
Protein, ur: NEGATIVE mg/dL
Specific Gravity, Urine: 1.005 (ref 1.005–1.030)
pH: 6 (ref 5.0–8.0)

## 2023-11-28 LAB — HCG, SERUM, QUALITATIVE: Preg, Serum: NEGATIVE

## 2023-11-28 LAB — COMPREHENSIVE METABOLIC PANEL
ALT: 19 U/L (ref 0–44)
AST: 21 U/L (ref 15–41)
Albumin: 3.9 g/dL (ref 3.5–5.0)
Alkaline Phosphatase: 47 U/L (ref 38–126)
Anion gap: 11 (ref 5–15)
BUN: 6 mg/dL (ref 6–20)
CO2: 23 mmol/L (ref 22–32)
Calcium: 9.4 mg/dL (ref 8.9–10.3)
Chloride: 103 mmol/L (ref 98–111)
Creatinine, Ser: 0.9 mg/dL (ref 0.44–1.00)
GFR, Estimated: >60 mL/min (ref >60)
Glucose, Bld: 76 mg/dL (ref 70–99)
Potassium: 3.9 mmol/L (ref 3.5–5.1)
Sodium: 137 mmol/L (ref 135–145)
Total Bilirubin: 0.5 mg/dL (ref 0.0–1.2)
Total Protein: 7.5 g/dL (ref 6.5–8.1)

## 2023-11-28 LAB — CBC WITH DIFFERENTIAL/PLATELET
Abs Immature Granulocytes: 0.02 10*3/uL (ref 0.00–0.07)
Basophils Absolute: 0 10*3/uL (ref 0.0–0.1)
Basophils Relative: 0 %
Eosinophils Absolute: 0 10*3/uL (ref 0.0–0.5)
Eosinophils Relative: 0 %
HCT: 39.6 % (ref 36.0–46.0)
Hemoglobin: 12.8 g/dL (ref 12.0–15.0)
Immature Granulocytes: 0 %
Lymphocytes Relative: 54 %
Lymphs Abs: 3.9 10*3/uL (ref 0.7–4.0)
MCH: 28.8 pg (ref 26.0–34.0)
MCHC: 32.3 g/dL (ref 30.0–36.0)
MCV: 89 fL (ref 80.0–100.0)
Monocytes Absolute: 0.4 10*3/uL (ref 0.1–1.0)
Monocytes Relative: 5 %
Neutro Abs: 3 10*3/uL (ref 1.7–7.7)
Neutrophils Relative %: 41 %
Platelets: 332 10*3/uL (ref 150–400)
RBC: 4.45 MIL/uL (ref 3.87–5.11)
RDW: 14.7 % (ref 11.5–15.5)
WBC: 7.3 10*3/uL (ref 4.0–10.5)
nRBC: 0 % (ref 0.0–0.2)

## 2023-11-28 LAB — LIPASE, BLOOD: Lipase: 25 U/L (ref 11–51)

## 2023-11-28 NOTE — ED Triage Notes (Signed)
Pt reports ABD pain x 2 weeks. Denies NVD or constipation. No vaginal pain or discharge. States pain is radiating toward back. Thinks she may be pregnant but states preg test at home is negative.

## 2023-11-29 ENCOUNTER — Emergency Department (HOSPITAL_COMMUNITY)
Admission: EM | Admit: 2023-11-29 | Discharge: 2023-11-29 | Disposition: A | Discharge status: Home / Self Care | Payer: Medicare HMO | Source: Home / Self Care | Attending: Emergency Medicine | Admitting: Emergency Medicine

## 2023-11-29 ENCOUNTER — Telehealth: Payer: Medicare HMO | Admitting: Physician Assistant

## 2023-11-29 ENCOUNTER — Emergency Department (HOSPITAL_COMMUNITY): Payer: Medicare HMO

## 2023-11-29 DIAGNOSIS — R1084 Generalized abdominal pain: Secondary | ICD-10-CM | POA: Insufficient documentation

## 2023-11-29 DIAGNOSIS — Z3202 Encounter for pregnancy test, result negative: Secondary | ICD-10-CM | POA: Insufficient documentation

## 2023-11-29 DIAGNOSIS — R109 Unspecified abdominal pain: Secondary | ICD-10-CM | POA: Diagnosis not present

## 2023-11-29 DIAGNOSIS — M549 Dorsalgia, unspecified: Secondary | ICD-10-CM

## 2023-11-29 LAB — URINALYSIS, ROUTINE W REFLEX MICROSCOPIC
Bacteria, UA: NONE SEEN
Bilirubin Urine: NEGATIVE
Glucose, UA: NEGATIVE mg/dL
Hgb urine dipstick: NEGATIVE
Ketones, ur: NEGATIVE mg/dL
Nitrite: NEGATIVE
Protein, ur: NEGATIVE mg/dL
Specific Gravity, Urine: 1.019 (ref 1.005–1.030)
pH: 6 (ref 5.0–8.0)

## 2023-11-29 LAB — COMPREHENSIVE METABOLIC PANEL
ALT: 16 U/L (ref 0–44)
AST: 18 U/L (ref 15–41)
Albumin: 3.4 g/dL — ABNORMAL LOW (ref 3.5–5.0)
Alkaline Phosphatase: 48 U/L (ref 38–126)
Anion gap: 8 (ref 5–15)
BUN: 10 mg/dL (ref 6–20)
CO2: 24 mmol/L (ref 22–32)
Calcium: 8.6 mg/dL — ABNORMAL LOW (ref 8.9–10.3)
Chloride: 104 mmol/L (ref 98–111)
Creatinine, Ser: 0.83 mg/dL (ref 0.44–1.00)
GFR, Estimated: >60 mL/min (ref >60)
Glucose, Bld: 91 mg/dL (ref 70–99)
Potassium: 4.1 mmol/L (ref 3.5–5.1)
Sodium: 136 mmol/L (ref 135–145)
Total Bilirubin: 0.5 mg/dL (ref 0.0–1.2)
Total Protein: 6.6 g/dL (ref 6.5–8.1)

## 2023-11-29 LAB — CBC WITH DIFFERENTIAL/PLATELET
Abs Immature Granulocytes: 0.01 10*3/uL (ref 0.00–0.07)
Basophils Absolute: 0 10*3/uL (ref 0.0–0.1)
Basophils Relative: 0 %
Eosinophils Absolute: 0 10*3/uL (ref 0.0–0.5)
Eosinophils Relative: 0 %
HCT: 34.8 % — ABNORMAL LOW (ref 36.0–46.0)
Hemoglobin: 11 g/dL — ABNORMAL LOW (ref 12.0–15.0)
Immature Granulocytes: 0 %
Lymphocytes Relative: 49 %
Lymphs Abs: 2.8 10*3/uL (ref 0.7–4.0)
MCH: 28.4 pg (ref 26.0–34.0)
MCHC: 31.6 g/dL (ref 30.0–36.0)
MCV: 89.9 fL (ref 80.0–100.0)
Monocytes Absolute: 0.5 10*3/uL (ref 0.1–1.0)
Monocytes Relative: 8 %
Neutro Abs: 2.4 10*3/uL (ref 1.7–7.7)
Neutrophils Relative %: 43 %
Platelets: 285 10*3/uL (ref 150–400)
RBC: 3.87 MIL/uL (ref 3.87–5.11)
RDW: 14.7 % (ref 11.5–15.5)
WBC: 5.7 10*3/uL (ref 4.0–10.5)
nRBC: 0 % (ref 0.0–0.2)

## 2023-11-29 LAB — HCG, QUANTITATIVE, PREGNANCY: hCG, Beta Chain, Quant, S: <1 m[IU]/mL (ref <5)

## 2023-11-29 LAB — LIPASE, BLOOD: Lipase: 25 U/L (ref 11–51)

## 2023-11-29 MED ORDER — IOHEXOL 300 MG/ML  SOLN
100.0000 mL | Freq: Once | INTRAMUSCULAR | Status: AC | PRN
  Administered 2023-11-29: 100 mL via INTRAVENOUS

## 2023-11-29 NOTE — Discharge Instructions (Addendum)
You were seen in the ER today for evaluation of your abdominal pain. Your workup was unremarkable. I have given you the follow up with a GI provider. Please make sure that you call to schedule an appointment. Please follow up with your PCP in the next few days for re-evaluation. If you have any concerns, new or worsening symptoms, please return to the nearest ER.   Get help right away if: You cannot stop vomiting. Your pain is only in one part of your belly, like on the right side. You have bloody or black poop, or poop that looks like tar. You have trouble breathing. You have chest pain. These symptoms may be an emergency. Get help right away. Call 911. Do not wait to see if the symptoms will go away. Do not drive yourself to the hospital.

## 2023-11-29 NOTE — ED Notes (Signed)
Patient left.

## 2023-11-29 NOTE — ED Provider Notes (Signed)
[place] EMERGENCY DEPARTMENT AT [facility] Provider Note   CSN: [id number] Arrival date & time: 11/29/23  0847     History Chief Complaint  Patient presents with   Back Pain   Abdominal Pain    Nicole [name] is a 33 y.o. female.   Back Pain Associated symptoms: abdominal pain   Abdominal Pain      Home Medications Prior to Admission medications   Medication Sig Start Date End Date Taking? Authorizing Provider  Alcohol Swabs (DROPSAFE ALCOHOL PREP) 70 % PADS Apply topically. 11/29/22   [provider]  ARIPiprazole ER (ABILIFY MAINTENA) 400 MG PRSY prefilled syringe Inject 400 mg into the muscle every 28 (twenty-eight) days. 11/07/23   Shanna Cisco, NP  fluticasone (FLONASE) 50 MCG/ACT nasal spray Place 1 spray into both nostrils daily. 11/29/22   [provider]  lidocaine (LIDODERM) 5 % Place 1 patch onto the skin daily. Remove & Discard patch within 12 hours or as directed by MD 11/25/23   Letta Kocher, NP  metroNIDAZOLE (FLAGYL) 500 MG tablet Take 1 tablet (500 mg total) by mouth 2 (two) times daily for 7 days. 11/27/23 12/04/23  Zenia Resides, MD  naproxen (NAPROSYN) 500 MG tablet Take 1 tablet (500 mg total) by mouth 2 (two) times daily. 11/25/23   Wynonia Lawman A, NP  ondansetron (ZOFRAN) 4 MG tablet Take 1 tablet (4 mg total) by mouth every 6 (six) hours. 05/30/23   Wynetta Fines, MD  pantoprazole (PROTONIX) 40 MG tablet Take 1 tablet (40 mg total) by mouth daily. 11/23/22 01/30/23  Lauree Chandler, NP  sertraline (ZOLOFT) 50 MG tablet Take 1 tablet (50 mg total) by mouth daily. 11/07/23   Shanna Cisco, NP  traZODone (DESYREL) 50 MG tablet Take 1 tablet (50 mg total) by mouth at bedtime as needed for sleep. 11/07/23   Shanna Cisco, NP  TRUE METRIX BLOOD GLUCOSE TEST test strip 1 each by Other route as needed (For blood sugar testing). 11/29/22   [provider]  TRUEplus Lancets 33G MISC  11/29/22    [provider]  valACYclovir (VALTREX) 500 MG tablet Take 500 mg by mouth daily.    [provider]      Allergies    Apple juice, Other, and Watermelon flavoring agent (non-screening)    Review of Systems   Review of Systems  Gastrointestinal:  Positive for abdominal pain.  Musculoskeletal:  Positive for back pain.    Physical Exam Updated Vital Signs BP 113/72 (BP Location: Right Arm)   Pulse 91   Temp 98.1 F (36.7 C) (Oral)   Resp 16   LMP 10/23/2023 (Approximate)   SpO2 98%  Physical Exam  ED Results / Procedures / Treatments   Labs (all labs ordered are listed, but only abnormal results are displayed) Labs Reviewed  CBC WITH DIFFERENTIAL/PLATELET - Abnormal; Notable for the following components:      Result Value   Hemoglobin 11.0 (*)    HCT 34.8 (*)    All other components within normal limits  COMPREHENSIVE METABOLIC PANEL - Abnormal; Notable for the following components:   Calcium 8.6 (*)    Albumin 3.4 (*)    All other components within normal limits  URINALYSIS, ROUTINE W REFLEX MICROSCOPIC - Abnormal; Notable for the following components:   APPearance CLOUDY (*)    Leukocytes,Ua TRACE (*)    All other components within normal limits  URINE CULTURE  HCG, QUANTITATIVE, PREGNANCY  LIPASE, BLOOD    EKG None  Radiology CT ABDOMEN PELVIS W CONTRAST Result Date: 11/29/2023 CLINICAL DATA:  Lower back and upper abdominal pain for the past week. EXAM: CT ABDOMEN AND PELVIS WITH CONTRAST TECHNIQUE: Multidetector CT imaging of the abdomen and pelvis was performed using the standard protocol following bolus administration of intravenous contrast. RADIATION DOSE REDUCTION: This exam was performed according to the departmental dose-optimization program which includes automated exposure control, adjustment of the mA and/or kV according to patient size and/or use of iterative reconstruction technique. CONTRAST:  OMNIPAQUE IOHEXOL 300 MG/ML  SOLN  COMPARISON:  CT abdomen and pelvis dated 10/02/2023. FINDINGS: Lower chest: No acute abnormality. Hepatobiliary: No focal liver abnormality is seen. No gallstones, gallbladder wall thickening, or biliary dilatation. Pancreas: Unremarkable. No pancreatic ductal dilatation or surrounding inflammatory changes. Spleen: Normal in size without focal abnormality. Adrenals/Urinary Tract: Adrenal glands are unremarkable. Kidneys are normal, without renal calculi, focal lesion, or hydronephrosis. Bladder is unremarkable. Stomach/Bowel: Stomach is within normal limits. Appendix appears normal. No evidence of bowel wall thickening, distention, or inflammatory changes. Vascular/Lymphatic: No significant vascular findings are present. No enlarged abdominal or pelvic lymph nodes. Reproductive: Uterus and bilateral adnexa are unremarkable. Other: There is trace fluid in the pelvis. No abdominal wall hernia. Musculoskeletal: No acute or significant osseous findings. IMPRESSION: Trace fluid in the pelvis is likely physiologic. Otherwise, no acute findings in the abdomen or pelvis. Electronically Signed   By: Romona Curls M.D.   On: 11/29/2023 12:28    Procedures Procedures  {Document cardiac monitor, telemetry assessment procedure when appropriate:1}  Medications Ordered in ED Medications  iohexol (OMNIPAQUE) 300 MG/ML solution 100 mL (100 mLs Intravenous Contrast Given 11/29/23 1213)    ED Course/ Medical Decision Making/ A&P   {   Click here for ABCD2, HEART and other calculatorsREFRESH Note before signing :1}                              Medical Decision Making Amount and/or Complexity of Data Reviewed Labs: ordered. Radiology: ordered.  Risk Prescription drug management.   33 y.o. female presents to the ER for evaluation of abdominal pain. Differential diagnosis includes but is not limited to AAA, mesenteric ischemia, appendicitis, diverticulitis, DKA, gastroenteritis, nephrolithiasis, pancreatitis,  constipation, UTI, bowel obstruction, biliary disease, IBD, PUD, hepatitis, ectopic pregnancy, ovarian torsion, PID. Vital signs unremarkable. Physical exam as noted above.   I independently reviewed and interpreted the patient's labs.  CBC without leukocytosis.  Mild anemia with hemoglobin of 11.  CMP shows mildly decreased calcium and albumin otherwise no other electrolyte or LFT abnormality.  Lipase within normal limits.  Urinalysis is cloudy urine with trace leukocytes but no nitrates, white blood cells, or bacteria present.  There is 21-50 with squamous cells present as well.  hCG blood test is undetectable.  Urine culture pending.  CT imaging shows trace fluid in the pelvis is likely physiologic. Otherwise, no acute findings in the abdomen or pelvis. Per radiologist's interpretation.    Will ask patient about pelvic examination. I do not think an ultrasound needs to be donw emergently. The patient has had pain since 2/9 and it diffuse in nature. She does not appear to be in any pain or acute distress, she is lying on the stretcher and appears comfortable. I doubt ovarian torsion or ectopic pregnancy because of this and lab values.   ***   After consideration of the diagnostic results and  the patients response to treatment, I feel that *** .   Emergency department workup does*** not suggest an emergent condition requiring admission or immediate intervention beyond what has been performed at this time.   ***We discussed the results of the labs/imaging. The plan is ***. We discussed strict return precautions and red flag symptoms. The patient verbalized their understanding and agrees to the plan. The patient is stable and being discharged home in good condition.  Portions of this report may have been transcribed using voice recognition software. Every effort was made to ensure accuracy; however, inadvertent computerized transcription errors may be present.   Final Clinical Impression(s) / ED  Diagnoses Final diagnoses:  None    Rx / DC Orders ED Discharge Orders     None

## 2023-11-29 NOTE — Progress Notes (Signed)
   Because of your symptoms, I feel your condition warrants further evaluation and I recommend that you be seen in a face-to-face visit.   NOTE: There will be NO CHARGE for this E-Visit   If you are having a true medical emergency, please call 911.     For an urgent face to face visit, [facility] has multiple urgent care centers for your convenience.  Click the link below for the full list of locations and hours, walk-in wait times, appointment scheduling options and driving directions:  Urgent Care - [place], [place], [place], [place], [place], [place]  [place]     Your MyChart E-visit questionnaire answers were reviewed by a board certified advanced clinical practitioner to complete your personal care plan based on your specific symptoms.    Thank you for using e-Visits.

## 2023-11-29 NOTE — Addendum Note (Signed)
Addended byLaure Kidney on: 11/29/2023 07:16 AM   Modules accepted: Level of Service

## 2023-11-29 NOTE — ED Triage Notes (Signed)
Per EMS from home. Pt reports lower back and upper abdominal pain for past week.   BP110/70 HR 92 99 on RA

## 2023-11-30 LAB — URINE CULTURE: Culture: 30000 — AB

## 2023-12-03 ENCOUNTER — Emergency Department (HOSPITAL_COMMUNITY)
Admission: EM | Admit: 2023-12-03 | Discharge: 2023-12-03 | Disposition: A | Discharge status: Home / Self Care | Payer: Medicare HMO | Attending: Emergency Medicine | Admitting: Emergency Medicine

## 2023-12-03 ENCOUNTER — Other Ambulatory Visit: Payer: Self-pay

## 2023-12-03 DIAGNOSIS — Z79899 Other long term (current) drug therapy: Secondary | ICD-10-CM | POA: Diagnosis not present

## 2023-12-03 DIAGNOSIS — M545 Low back pain, unspecified: Secondary | ICD-10-CM | POA: Diagnosis not present

## 2023-12-03 DIAGNOSIS — M549 Dorsalgia, unspecified: Secondary | ICD-10-CM | POA: Diagnosis present

## 2023-12-03 LAB — URINALYSIS, ROUTINE W REFLEX MICROSCOPIC
Bilirubin Urine: NEGATIVE
Glucose, UA: NEGATIVE mg/dL
Hgb urine dipstick: NEGATIVE
Ketones, ur: 5 mg/dL — AB
Nitrite: NEGATIVE
Protein, ur: NEGATIVE mg/dL
Specific Gravity, Urine: 1.027 (ref 1.005–1.030)
pH: 6 (ref 5.0–8.0)

## 2023-12-03 LAB — POC URINE PREG, ED: Preg Test, Ur: NEGATIVE

## 2023-12-03 MED ORDER — ACETAMINOPHEN 500 MG PO TABS
1000.0000 mg | ORAL_TABLET | Freq: Once | ORAL | Status: AC
  Administered 2023-12-03: 1000 mg via ORAL
  Filled 2023-12-03: qty 2

## 2023-12-03 MED ORDER — IBUPROFEN 600 MG PO TABS: 600.0000 mg | ORAL_TABLET | Freq: Four times a day (QID) | ORAL | 0 refills | Status: DC | PRN

## 2023-12-03 MED ORDER — CYCLOBENZAPRINE HCL 10 MG PO TABS: 10.0000 mg | ORAL_TABLET | Freq: Two times a day (BID) | ORAL | 0 refills | Status: DC | PRN

## 2023-12-03 NOTE — Discharge Instructions (Addendum)
Take medications prescribed as needed for your back pain

## 2023-12-03 NOTE — ED Triage Notes (Signed)
Pt BIBA from home. C/o upper back pain for 2x weeks, no associated injury.  AOx4

## 2023-12-03 NOTE — ED Provider Notes (Signed)
[place] [facility]MENT AT [facility] Provider Note   CSN: [id number] Arrival date & time: 12/03/23  1333     History  Chief Complaint  Patient presents with   Back Pain    Nicole [name] is a 33 y.o. female.  The history is provided by the patient and medical records. No language interpreter was used.  Back Pain Associated symptoms: no fever      33 year old female history of schizoaffective disorder, intellectual disability brought here via EMS from home with complaint of back pain.  Patient report pain throughout her back ongoing for the past 2 weeks.  Pain is described as an achy sensation, worse when she lays down or when she moves around and not improved despite taking over-the-counter medications like ibuprofen and change in position.  She denies any fever or chills no runny nose sneezing or coughing no urinary discomfort and no trauma.  Home Medications Prior to Admission medications   Medication Sig Start Date End Date Taking? Authorizing Provider  Alcohol Swabs (DROPSAFE ALCOHOL PREP) 70 % PADS Apply topically. 11/29/22   [provider]  ARIPiprazole ER (ABILIFY MAINTENA) 400 MG PRSY prefilled syringe Inject 400 mg into the muscle every 28 (twenty-eight) days. 11/07/23   Shanna Cisco, NP  fluticasone (FLONASE) 50 MCG/ACT nasal spray Place 1 spray into both nostrils daily. 11/29/22   [provider]  lidocaine (LIDODERM) 5 % Place 1 patch onto the skin daily. Remove & Discard patch within 12 hours or as directed by MD 11/25/23   Letta Kocher, NP  metroNIDAZOLE (FLAGYL) 500 MG tablet Take 1 tablet (500 mg total) by mouth 2 (two) times daily for 7 days. 11/27/23 12/04/23  Zenia Resides, MD  naproxen (NAPROSYN) 500 MG tablet Take 1 tablet (500 mg total) by mouth 2 (two) times daily. 11/25/23   Wynonia Lawman A, NP  ondansetron (ZOFRAN) 4 MG tablet Take 1 tablet (4 mg total) by mouth every 6 (six) hours. 05/30/23   Wynetta Fines, MD  pantoprazole (PROTONIX) 40 MG tablet Take 1 tablet (40 mg total) by mouth daily. 11/23/22 01/30/23  Lauree Chandler, NP  sertraline (ZOLOFT) 50 MG tablet Take 1 tablet (50 mg total) by mouth daily. 11/07/23   Shanna Cisco, NP  traZODone (DESYREL) 50 MG tablet Take 1 tablet (50 mg total) by mouth at bedtime as needed for sleep. 11/07/23   Shanna Cisco, NP  TRUE METRIX BLOOD GLUCOSE TEST test strip 1 each by Other route as needed (For blood sugar testing). 11/29/22   [provider]  TRUEplus Lancets 33G MISC  11/29/22   [provider]  valACYclovir (VALTREX) 500 MG tablet Take 500 mg by mouth daily.    [provider]      Allergies    Apple juice, Other, and Watermelon flavoring agent (non-screening)    Review of Systems   Review of Systems  Constitutional:  Negative for fever.  Musculoskeletal:  Positive for back pain.    Physical Exam Updated Vital Signs BP (!) 116/93 (BP Location: Right Arm)   Pulse 79   Temp 98.2 F (36.8 C) (Oral)   Resp 16   Ht 5\' 1"  (1.549 m)   Wt 86.2 kg   LMP 10/23/2023 (Approximate)   SpO2 100%   BMI 35.90 kg/m  Physical Exam Vitals and nursing note reviewed.  Constitutional:      General: She is not in acute distress.    Appearance: She  is well-developed.  HENT:     Head: Atraumatic.  Eyes:     Conjunctiva/sclera: Conjunctivae normal.  Pulmonary:     Effort: Pulmonary effort is normal.  Abdominal:     Palpations: Abdomen is soft.     Tenderness: There is no abdominal tenderness.  Musculoskeletal:        General: Tenderness (Tenderness along lumbar spine on palpation and paraspinal muscle.  No overlying skin changes) present.     Cervical back: Neck supple.  Skin:    Findings: No rash.  Neurological:     Mental Status: She is alert.  Psychiatric:        Mood and Affect: Mood normal.     ED Results / Procedures / Treatments   Labs (all labs ordered are listed, but only abnormal  results are displayed) Labs Reviewed  URINALYSIS, ROUTINE W REFLEX MICROSCOPIC - Abnormal; Notable for the following components:      Result Value   APPearance HAZY (*)    Ketones, ur 5 (*)    Leukocytes,Ua SMALL (*)    Bacteria, UA RARE (*)    All other components within normal limits  POC URINE PREG, ED    EKG None  Radiology No results found.  Procedures Procedures    Medications Ordered in ED Medications  acetaminophen (TYLENOL) tablet 1,000 mg (1,000 mg Oral Given 12/03/23 1554)    ED Course/ Medical Decision Making/ A&P                                 Medical Decision Making Amount and/or Complexity of Data Reviewed Labs: ordered.  Risk OTC drugs.   BP (!) 116/93 (BP Location: Right Arm)   Pulse 79   Temp 98.2 F (36.8 C) (Oral)   Resp 16   Ht 5\' 1"  (1.549 m)   Wt 86.2 kg   LMP 10/23/2023 (Approximate)   SpO2 100%   BMI 35.90 kg/m   69:58 PM 33 year old female history of schizoaffective disorder, intellectual disability brought here via EMS from home with complaint of back pain.  Patient report pain throughout her back ongoing for the past 2 weeks.  Pain is described as an achy sensation, worse when she lays down or when she moves around and not improved despite taking over-the-counter medications like ibuprofen and change in position.  She denies any fever or chills no runny nose sneezing or coughing no urinary discomfort and no trauma.  On exam, patient is resting comfortably appears to be in no acute discomfort.  She does have some reproducible tenderness along her lumbar and paralumbar spinal muscle.  She ambulate without difficulty.  She is neurovascularly intact.  Abdomen is soft nontender.  Labs obtained independent viewed interpreted by me and is negative for UTI, pregnancy test is negative.  Imaging including L-spine x-ray considered but not performed as patient denies any specific injury causing her symptoms.  DDx: Musculoskeletal pain, UTI,  pyelonephritis, kidney stone, dissection, shingles  Patient overall well-appearing, report improvement of symptoms after receiving Tylenol.  Will discharge home with muscle relaxant and outpatient follow-up.  Return cautions given.        Final Clinical Impression(s) / ED Diagnoses Final diagnoses:  Acute low back pain without sciatica, unspecified back pain laterality    Rx / DC Orders ED Discharge Orders          Ordered    ibuprofen (ADVIL) 600 MG tablet  Every 6  hours PRN        12/03/23 1703    cyclobenzaprine (FLEXERIL) 10 MG tablet  2 times daily PRN        12/03/23 1703              Fayrene Helper, PA-C 12/03/23 1704    Cathren Laine, MD 12/03/23 [phone]

## 2023-12-07 ENCOUNTER — Encounter (HOSPITAL_COMMUNITY): Payer: Self-pay | Admitting: Emergency Medicine

## 2023-12-07 ENCOUNTER — Other Ambulatory Visit: Payer: Self-pay

## 2023-12-07 ENCOUNTER — Emergency Department (HOSPITAL_COMMUNITY)
Admission: EM | Admit: 2023-12-07 | Discharge: 2023-12-08 | Disposition: A | Discharge status: Home / Self Care | Payer: Medicare HMO | Attending: Emergency Medicine | Admitting: Emergency Medicine

## 2023-12-07 DIAGNOSIS — Z79899 Other long term (current) drug therapy: Secondary | ICD-10-CM | POA: Insufficient documentation

## 2023-12-07 DIAGNOSIS — F411 Generalized anxiety disorder: Secondary | ICD-10-CM

## 2023-12-07 DIAGNOSIS — F39 Unspecified mood [affective] disorder: Secondary | ICD-10-CM | POA: Diagnosis present

## 2023-12-07 LAB — CBC
HCT: 36.5 % (ref 36.0–46.0)
Hemoglobin: 11.9 g/dL — ABNORMAL LOW (ref 12.0–15.0)
MCH: 29 pg (ref 26.0–34.0)
MCHC: 32.6 g/dL (ref 30.0–36.0)
MCV: 89 fL (ref 80.0–100.0)
Platelets: 280 10*3/uL (ref 150–400)
RBC: 4.1 MIL/uL (ref 3.87–5.11)
RDW: 14.6 % (ref 11.5–15.5)
WBC: 6.9 10*3/uL (ref 4.0–10.5)
nRBC: 0 % (ref 0.0–0.2)

## 2023-12-07 LAB — COMPREHENSIVE METABOLIC PANEL
ALT: 19 U/L (ref 0–44)
AST: 19 U/L (ref 15–41)
Albumin: 3.6 g/dL (ref 3.5–5.0)
Alkaline Phosphatase: 50 U/L (ref 38–126)
Anion gap: 8 (ref 5–15)
BUN: 10 mg/dL (ref 6–20)
CO2: 22 mmol/L (ref 22–32)
Calcium: 9 mg/dL (ref 8.9–10.3)
Chloride: 107 mmol/L (ref 98–111)
Creatinine, Ser: 0.69 mg/dL (ref 0.44–1.00)
GFR, Estimated: >60 mL/min (ref >60)
Glucose, Bld: 89 mg/dL (ref 70–99)
Potassium: 3.8 mmol/L (ref 3.5–5.1)
Sodium: 137 mmol/L (ref 135–145)
Total Bilirubin: 0.4 mg/dL (ref 0.0–1.2)
Total Protein: 7 g/dL (ref 6.5–8.1)

## 2023-12-07 LAB — SALICYLATE LEVEL: Salicylate Lvl: <7 mg/dL — ABNORMAL LOW (ref 7.0–30.0)

## 2023-12-07 LAB — HCG, SERUM, QUALITATIVE: Preg, Serum: NEGATIVE

## 2023-12-07 LAB — ETHANOL: Alcohol, Ethyl (B): <10 mg/dL (ref <10)

## 2023-12-07 LAB — ACETAMINOPHEN LEVEL: Acetaminophen (Tylenol), Serum: <10 ug/mL — ABNORMAL LOW (ref 10–30)

## 2023-12-07 NOTE — ED Triage Notes (Addendum)
 Patient c/o a mental breakdown and her head "dinging." Patient's visitor denies SI/HI as patient is non verbal in triage but will shake her head yes or no.  Patient here voluntarily.

## 2023-12-07 NOTE — ED Provider Notes (Signed)
 [place] EMERGENCY DEPARTMENT AT [facility] Provider Note   CSN: [id number] Arrival date & time: 12/07/23  2135     History {Add pertinent medical, surgical, social history, OB history to HPI:1} Chief Complaint  Patient presents with   Mental Health Problem    Nicole [name] is a 33 y.o. female.  The history is provided by the patient.  Mental Health Problem Nicole [name] is a 33 y.o. female who presents to the Emergency Department complaining of mental breakdown.  She presents to the emergency department with complaint of mental breakdown.  She states that this started today with racing thoughts and feeling that she is not good enough.  No active SI or HI.  She has been out of her trazodone for the last 4 to 5 days.  And feeling like she is not good enough.     Home Medications Prior to Admission medications   Medication Sig Start Date End Date Taking? Authorizing Provider  Alcohol Swabs (DROPSAFE ALCOHOL PREP) 70 % PADS Apply topically. 11/29/22   [provider]  ARIPiprazole ER (ABILIFY MAINTENA) 400 MG PRSY prefilled syringe Inject 400 mg into the muscle every 28 (twenty-eight) days. 11/07/23   Shanna Cisco, NP  cyclobenzaprine (FLEXERIL) 10 MG tablet Take 1 tablet (10 mg total) by mouth 2 (two) times daily as needed for muscle spasms. 12/03/23   Fayrene Helper, PA-C  fluticasone (FLONASE) 50 MCG/ACT nasal spray Place 1 spray into both nostrils daily. 11/29/22   [provider]  ibuprofen (ADVIL) 600 MG tablet Take 1 tablet (600 mg total) by mouth every 6 (six) hours as needed. 12/03/23   Fayrene Helper, PA-C  lidocaine (LIDODERM) 5 % Place 1 patch onto the skin daily. Remove & Discard patch within 12 hours or as directed by MD 11/25/23   Wynonia Lawman A, NP  naproxen (NAPROSYN) 500 MG tablet Take 1 tablet (500 mg total) by mouth 2 (two) times daily. 11/25/23   Wynonia Lawman A, NP  ondansetron (ZOFRAN) 4 MG tablet Take 1 tablet (4 mg total) by  mouth every 6 (six) hours. 05/30/23   Wynetta Fines, MD  pantoprazole (PROTONIX) 40 MG tablet Take 1 tablet (40 mg total) by mouth daily. 11/23/22 01/30/23  Lauree Chandler, NP  sertraline (ZOLOFT) 50 MG tablet Take 1 tablet (50 mg total) by mouth daily. 11/07/23   Shanna Cisco, NP  traZODone (DESYREL) 50 MG tablet Take 1 tablet (50 mg total) by mouth at bedtime as needed for sleep. 11/07/23   Shanna Cisco, NP  TRUE METRIX BLOOD GLUCOSE TEST test strip 1 each by Other route as needed (For blood sugar testing). 11/29/22   [provider]  TRUEplus Lancets 33G MISC  11/29/22   [provider]  valACYclovir (VALTREX) 500 MG tablet Take 500 mg by mouth daily.    [provider]      Allergies    Apple juice, Other, and Watermelon flavoring agent (non-screening)    Review of Systems   Review of Systems  All other systems reviewed and are negative.   Physical Exam Updated Vital Signs BP (!) 136/99 (BP Location: Left Arm)   Pulse 85   Temp 98.9 F (37.2 C) (Oral)   Resp 17   LMP 10/23/2023 (Approximate)   SpO2 99%  Physical Exam Vitals and nursing note reviewed.  Constitutional:      Appearance: She is well-developed.  HENT:     Head: Normocephalic and atraumatic.  Cardiovascular:  Rate and Rhythm: Normal rate and regular rhythm.  Pulmonary:     Effort: Pulmonary effort is normal. No respiratory distress.  Musculoskeletal:        General: No tenderness.  Skin:    General: Skin is warm and dry.  Neurological:     Mental Status: She is alert and oriented to person, place, and time.  Psychiatric:     Comments: Flat affect. Does not appear to be responding to internal stimuli.      ED Results / Procedures / Treatments   Labs (all labs ordered are listed, but only abnormal results are displayed) Labs Reviewed  SALICYLATE LEVEL - Abnormal; Notable for the following components:      Result Value   Salicylate Lvl <7.0 (*)    All other  components within normal limits  ACETAMINOPHEN LEVEL - Abnormal; Notable for the following components:   Acetaminophen (Tylenol), Serum <10 (*)    All other components within normal limits  CBC - Abnormal; Notable for the following components:   Hemoglobin 11.9 (*)    All other components within normal limits  COMPREHENSIVE METABOLIC PANEL  ETHANOL  HCG, SERUM, QUALITATIVE  RAPID URINE DRUG SCREEN, HOSP PERFORMED    EKG None  Radiology No results found.  Procedures Procedures  {Document cardiac monitor, telemetry assessment procedure when appropriate:1}  Medications Ordered in ED Medications - No data to display  ED Course/ Medical Decision Making/ A&P   {   Click here for ABCD2, HEART and other calculatorsREFRESH Note before signing :1}                              Medical Decision Making Amount and/or Complexity of Data Reviewed Labs: ordered.   ***  {Document critical care time when appropriate:1} {Document review of labs and clinical decision tools ie heart score, Chads2Vasc2 etc:1}  {Document your independent review of radiology images, and any outside records:1} {Document your discussion with family members, caretakers, and with consultants:1} {Document social determinants of health affecting pt's care:1} {Document your decision making why or why not admission, treatments were needed:1} Final Clinical Impression(s) / ED Diagnoses Final diagnoses:  None    Rx / DC Orders ED Discharge Orders     None

## 2023-12-08 DIAGNOSIS — F39 Unspecified mood [affective] disorder: Secondary | ICD-10-CM | POA: Diagnosis not present

## 2023-12-08 MED ORDER — TRAZODONE HCL 100 MG PO TABS
50.0000 mg | ORAL_TABLET | Freq: Once | ORAL | Status: AC
  Administered 2023-12-08: 50 mg via ORAL
  Filled 2023-12-08: qty 1

## 2023-12-10 ENCOUNTER — Inpatient Hospital Stay (HOSPITAL_COMMUNITY): Admit: 2023-12-10 | Payer: Medicare HMO

## 2023-12-10 ENCOUNTER — Ambulatory Visit (HOSPITAL_COMMUNITY)
Admission: EM | Admit: 2023-12-10 | Discharge: 2023-12-10 | Disposition: A | Discharge status: Home / Self Care | Payer: Medicare HMO | Attending: Nurse Practitioner | Admitting: Nurse Practitioner

## 2023-12-10 DIAGNOSIS — R45851 Suicidal ideations: Secondary | ICD-10-CM | POA: Insufficient documentation

## 2023-12-10 DIAGNOSIS — F250 Schizoaffective disorder, bipolar type: Secondary | ICD-10-CM | POA: Diagnosis present

## 2023-12-10 DIAGNOSIS — F330 Major depressive disorder, recurrent, mild: Secondary | ICD-10-CM

## 2023-12-10 DIAGNOSIS — F17210 Nicotine dependence, cigarettes, uncomplicated: Secondary | ICD-10-CM | POA: Insufficient documentation

## 2023-12-10 DIAGNOSIS — Z79899 Other long term (current) drug therapy: Secondary | ICD-10-CM | POA: Insufficient documentation

## 2023-12-10 MED ORDER — NICOTINE 21 MG/24HR TD PT24: 21.0000 mg | MEDICATED_PATCH | Freq: Once | TRANSDERMAL | 5 refills | Status: AC

## 2023-12-10 MED ORDER — ARIPIPRAZOLE ER 400 MG IM PRSY
400.0000 mg | PREFILLED_SYRINGE | INTRAMUSCULAR | Status: DC
  Filled 2023-12-10: qty 400

## 2023-12-10 MED ORDER — NICOTINE 21 MG/24HR TD PT24: 21.0000 mg | MEDICATED_PATCH | Freq: Once | TRANSDERMAL | 5 refills | Status: DC

## 2023-12-10 MED ORDER — ARIPIPRAZOLE ER 400 MG IM PRSY
400.0000 mg | PREFILLED_SYRINGE | INTRAMUSCULAR | Status: DC
  Administered 2023-12-10: 400 mg via INTRAMUSCULAR
  Filled 2023-12-10: qty 2
  Filled 2023-12-10: qty 400

## 2023-12-10 MED ORDER — NICOTINE 21 MG/24HR TD PT24: 21.0000 mg | MEDICATED_PATCH | Freq: Once | TRANSDERMAL | Status: DC
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
## 2023-12-10 NOTE — Progress Notes (Signed)
   12/10/23 1406  BHUC Triage Screening (Walk-ins at [facility] only)  How Did You Hear About [place]? Family/Friend  What Is the Reason for Your Visit/Call Today? Sena Hoopingarner presents to [facility] voluntarily accompanied by a friend. Pt states that she keep having crying spells and thoughts of hurting herself. Pt states that she had SI thoughts this morning with the plan to jump in front of a car. Pt states that she had HI thoughts on yesterday. Pt currently denies SI, HI, VH and drug use. Pt states that she has AH hearing voices telling her she is a failure, not good enough and better off dead. Pt states that she drank a beer this morning. Pt states that she needs her medication and to know how she get better.  How Long Has This Been Causing You Problems? 1 wk - 1 month  Have You Recently Had Any Thoughts About Hurting Yourself? Yes  How long ago did you have thoughts about hurting yourself? today - jump in front of a car  Are You Planning to Commit Suicide/Harm Yourself At This time? No  Have you Recently Had Thoughts About Hurting Someone [name]? Yes  How long ago did you have thoughts of harming others? yesterday  Are You Planning To Harm Someone At This Time? No  Physical Abuse Denies  Verbal Abuse Denies  Sexual Abuse Denies  Exploitation of patient/patient's resources Denies  Self-Neglect Denies  Are you currently experiencing any auditory, visual or other hallucinations? Yes  Please explain the hallucinations you are currently experiencing: Auditory - hearing that she isn't good enough, she's a failure and better off dead  Have You Used Any Alcohol or Drugs in the Past 24 Hours? Yes  How long ago did you use Drugs or Alcohol? this morning - beer  Do you have any current medical co-morbidities that require immediate attention? No  Clinician description of patient physical appearance/behavior: soft spoken, cooperative, calm  What Do You Feel Would Help You the Most Today? Medication(s);Treatment for  Depression or other mood problem  If access to [facility] was not available, would you have sought care in the Emergency Department? No  Determination of Need Routine (7 days)  Options For Referral Medication Management;Intensive Outpatient Therapy;Outpatient Therapy;Inpatient Hospitalization

## 2023-12-10 NOTE — Discharge Instructions (Addendum)
 Abilify Maintena 400 mg scheduled for tomorrow will be given today 12/10/2023. Please f/u with your outpatient provider regarding next appointment. Patient also educated by Clinical research associate on how to seek help in case her symptoms worsen including calling [phone], [phone], presenting to any ER, coming back to this behavioral health urgent care Center. Patient verbalizes understanding of all instructions.

## 2023-12-10 NOTE — ED Notes (Signed)
 Patient discharged in no acute distress. AVS given. Belongings returned. Pt escorted to the lobby by staff for transport home by caregiver.

## 2023-12-10 NOTE — ED Provider Notes (Signed)
 Behavioral Health Urgent Care Medical Screening Exam  Patient Name: Nicole [name] MRN: [id number] Date of Evaluation: 12/10/23 Chief Complaint: SI Diagnosis:  Final diagnoses:  Schizoaffective disorder, bipolar type (HCC)   History of Present illness: Nicole [name] is a 33 y.o. female with prior mental health diagnoses of schizoaffective disorder brought in today by her case manager, with complaints of crying spells and suicidal ideations, reported HI towards her boyfriend, states that she has thought of smothering him with a pillow while he is sleeping, but reports that the last time she had these thoughts was a few days ago.    Information obtained during assessment: Patient currently presents with passive suicidal ideations, denies having a current plan, denies having an intent to harm herself, reports feeling safe in the community at this time.  Patient denies any self-injurious behaviors, reports that she sometimes hears voices stating that she is worth noting, denies that she has had this thought in a while, reports sometimes feeling paranoid that people are out to hurt her, also denies that she has had these thoughts recently.  Patient reports that she has care established on the outside, does therapy with [facility], and medication management at this [facility] behavioral health center with Ms. Toy Cookey, due to for her long-acting injectable Abilify 400 mg maintain her tomorrow morning, 2/27.  Agreeable to taking the medication today.  Patient reports a history of recurrent hospitalizations in the past, but denies that she has had one most recently, denies any history of substance use, reports drinking beer at times, but states that 1 beer last like a week.  She denies any other substance abuse, with the exception of nicotine use, states that she smokes a pack a day.  She reports that she resides by herself, has a case Production designer, theatre/television/film with [facility], has a legal guardian who is  her father, and who is supportive.  Not receptive to her father being called, but since father is legal guardian, Clinical research associate placed a call to let him know that patient presented to this hospital today, and call was not anxiety and went straight to voicemail.  Patient denies having any medical issues, denies having any current stressors.  Denies having any access to weapons, feels comfortable being discharged today, denies any safety concerns, reports that she would her symptoms worsen, she knows how to seek help by reaching out to her support system, including her case Production designer, theatre/television/film.  Patient also educated by Clinical research associate on how to seek help in case her symptoms worsen including calling [phone], 911, presenting to any ER, coming back to this behavioral health urgent care Center.  Patient verbalizes understanding of all instructions.  Abilify Maintena 400 mg scheduled for tomorrow will be given today 12/10/2023.  Patient will be discharged today, with plan to follow up with her outpatient provider for continuity of care outside of the hospital.  She denies current HI/AVH, denies paranoia, denies delusional thoughts, denies first rank symptoms.  Denies plans or intent to harm herself or anyone else in the community.  Verbalizes understanding of discharge instructions.  Flowsheet Row ED from 12/10/2023 in [facility] ED from 12/07/2023 in [facility] Emergency Department at [facility] ED from 12/03/2023 in [facility] Emergency Department at [facility]  C-SSRS RISK CATEGORY Moderate Risk No Risk No Risk       Psychiatric Specialty Exam  Presentation  General Appearance:Appropriate for Environment  Eye Contact:Fair  Speech:Clear and Coherent  Speech Volume:Normal  Handedness:Right  Mood and Affect  Mood: Euthymic  Affect: Congruent   Thought Process  Thought Processes: Coherent  Descriptions of Associations:Intact  Orientation:Full (Time, Place and  Person)  Thought Content:Logical  Diagnosis of Schizophrenia or Schizoaffective disorder in past: No   Hallucinations:None Patient reports hearing voices  telling her nobody cares and she should just go and die denies  Ideas of Reference:None  Suicidal Thoughts:Yes, Passive With Plan Without Intent; Without Plan  Homicidal Thoughts:No   Sensorium  Memory: Immediate Fair  Judgment: Fair  Insight: Present   Executive Functions  Concentration: Fair  Attention Span: Fair  Recall: [employer] of Knowledge: Fair  Language: Fair   Psychomotor Activity  Psychomotor Activity: Normal   Assets  Assets: Communication Skills   Sleep  Sleep: Fair  Number of hours:  5   Physical Exam: Physical Exam Vitals and nursing note reviewed.    Review of Systems  Psychiatric/Behavioral:  Positive for depression (Denies active SI, plan or intent). Negative for hallucinations, memory loss, substance abuse and suicidal ideas. The patient is not nervous/anxious and does not have insomnia.   All other systems reviewed and are negative.  Blood pressure 108/81, pulse 86, temperature 98.3 F (36.8 C), temperature source Oral, resp. rate 16, last menstrual period 10/23/2023, SpO2 100%. There is no height or weight on file to calculate BMI.  Musculoskeletal: Strength & Muscle Tone: within normal limits Gait & Station: normal Patient leans: N/A  [facility] MSE Discharge Disposition for Follow up and Recommendations: Based on my evaluation the patient does not appear to have an emergency medical condition and can be discharged with resources and follow up care in outpatient services for Discharge patient with follow up outpatient with therapy and medication management by her outpatient provider.  -Given Abilify Maintaina LAI 400 mg IM on 12/10/2023  Starleen Blue, NP 12/10/2023, 4:43 PM

## 2023-12-11 ENCOUNTER — Ambulatory Visit (HOSPITAL_COMMUNITY): Payer: Medicare HMO

## 2023-12-17 ENCOUNTER — Emergency Department (HOSPITAL_COMMUNITY)
Admission: EM | Admit: 2023-12-17 | Discharge: 2023-12-18 | Disposition: A | Discharge status: Home / Self Care | Attending: Emergency Medicine | Admitting: Emergency Medicine

## 2023-12-17 ENCOUNTER — Other Ambulatory Visit: Payer: Self-pay

## 2023-12-17 DIAGNOSIS — T7840XA Allergy, unspecified, initial encounter: Secondary | ICD-10-CM | POA: Insufficient documentation

## 2023-12-17 DIAGNOSIS — Z5321 Procedure and treatment not carried out due to patient leaving prior to being seen by health care provider: Secondary | ICD-10-CM | POA: Insufficient documentation

## 2023-12-17 NOTE — ED Triage Notes (Signed)
 Pt arrived POV with c/o allergic reaction to dog she just gotten. Pt has taken Benadryl at 1400 but has no relief. Pt complains of itching and swelling on the face and arms, nausea and "feeling hot".

## 2023-12-18 ENCOUNTER — Ambulatory Visit: Payer: Medicare HMO | Admitting: Obstetrics and Gynecology

## 2023-12-18 NOTE — ED Notes (Signed)
 No answer for room

## 2023-12-18 NOTE — ED Notes (Signed)
 PT LEFT AMA DECIDED TO NOT BE SEEN, GOT UBER AND LEFT

## 2024-01-01 ENCOUNTER — Ambulatory Visit: Payer: Self-pay | Admitting: Allergy

## 2024-01-07 ENCOUNTER — Ambulatory Visit (INDEPENDENT_AMBULATORY_CARE_PROVIDER_SITE_OTHER): Payer: Medicare HMO

## 2024-01-07 ENCOUNTER — Encounter (HOSPITAL_COMMUNITY): Payer: Self-pay | Admitting: Family

## 2024-01-07 ENCOUNTER — Ambulatory Visit (INDEPENDENT_AMBULATORY_CARE_PROVIDER_SITE_OTHER): Admitting: Family

## 2024-01-07 VITALS — BP 109/83 | HR 90 | Ht 61.0 in | Wt 220.0 lb

## 2024-01-07 DIAGNOSIS — F209 Schizophrenia, unspecified: Secondary | ICD-10-CM | POA: Diagnosis not present

## 2024-01-07 DIAGNOSIS — F331 Major depressive disorder, recurrent, moderate: Secondary | ICD-10-CM

## 2024-01-07 DIAGNOSIS — F250 Schizoaffective disorder, bipolar type: Secondary | ICD-10-CM

## 2024-01-07 MED ORDER — TRAZODONE HCL 50 MG PO TABS: 50.0000 mg | ORAL_TABLET | Freq: Every day | ORAL | 2 refills | Status: DC

## 2024-01-07 NOTE — Progress Notes (Signed)
 [facility] MD/PA/NP OP Progress Note  01/07/2024 3:14 PM Nicole [name]  MRN:  [id number]  Chief Complaint: Long-acting injection clinic  HPI: Nicole [name] 33 year old African-American female presents for long-acting injectable clinic for monthly Abilify Maintena 400 mg.  Seen and evaluated face-to-face by this provider.  Carries a diagnosis of schizoaffective disorder, generalized anxiety disorder , major depressive  and disorder borderline functioning. She reports taking and tolerating medications well.  Denying any medication side effects.  Reports her mood has improved since taking medications as directed.  States she was last seen  [facility] due to missed appointment, stated that she has been on track with medication since her last discharge.  Nicole [name] is requesting refill on trazodone as she reports mild sleep disturbance issues.  No concerns related to suicidal or homicidal ideations.  Denies auditory or visual hallucinations. During evaluation Nicole [name] is sitting; affect congruent with mood.  Engaged in conversation.  Aims 0, denied concerns or restlessness or increased anxiety.  Reports she has kept her outpatient follow-up appointment with Nurse practitioner Doyne Keel.   Nicole [name] is alert/oriented x 3; calm/cooperative; and mood congruent with affect.  Patient is speaking in a clear tone at moderate volume, and normal pace; with good eye contact. Her thought process is coherent and relevant; There is no indication that she is currently responding to internal/external stimuli or experiencing delusional thought content.  Patient denies suicidal/self-harm/homicidal ideation, psychosis, and paranoia.  Patient has remained calm throughout assessment and has answered questions appropriately.     Visit Diagnosis:    ICD-10-CM   1. Schizophrenia, unspecified type (HCC) [F20.9]  F20.9     2. MDD (major depressive disorder), recurrent episode, moderate (HCC)  F33.1       Past  Psychiatric History:   Past Medical History:  Past Medical History:  Diagnosis Date   Anxiety    Depression    Hypothyroidism 08/07/2022   Tobacco use disorder 08/07/2022    Past Surgical History:  Procedure Laterality Date   WISDOM TOOTH EXTRACTION Bilateral 2020    Family Psychiatric History:   Family History:  Family History  Problem Relation Age of Onset   Hypertension Father    Diabetes Father     Social History:  Social History   Socioeconomic History   Marital status: Single    Spouse name: Not on file   Number of children: Not on file   Years of education: Not on file   Highest education level: Not on file  Occupational History   Not on file  Tobacco Use   Smoking status: Every Day    Types: Cigarettes   Smokeless tobacco: Never  Vaping Use   Vaping status: Some Days  Substance and Sexual Activity   Alcohol use: Yes    Comment: weekly   Drug use: Never   Sexual activity: Yes    Partners: Female    Birth control/protection: Implant  Other Topics Concern   Not on file  Social History Narrative   Not on file   Social Drivers of Health   Financial Resource Strain: Low Risk  (12/13/2022)   Overall Financial Resource Strain (CARDIA)    Difficulty of Paying Living Expenses: Not hard at all  Food Insecurity: No Food Insecurity (12/13/2022)   Hunger Vital Sign    Worried About Running Out of Food in the Last Year: Never true    Ran Out of Food in the Last Year: Never true  Transportation Needs: Unmet Transportation Needs (03/03/2023)  PRAPARE - Administrator, Civil Service (Medical): Yes    Lack of Transportation (Non-Medical): Yes  Physical Activity: Inactive (12/13/2022)   Exercise Vital Sign    Days of Exercise per Week: 0 days    Minutes of Exercise per Session: 0 min  Stress: Stress Concern Present (12/13/2022)   [employer] of Occupational Health - Occupational Stress Questionnaire    Feeling of Stress : Very much  Social  Connections: Socially Integrated (12/13/2022)   Social Connection and Isolation Panel [NHANES]    Frequency of Communication with Friends and Family: More than three times a week    Frequency of Social Gatherings with Friends and Family: Three times a week    Attends Religious Services: More than 4 times per year    Active Member of Clubs or Organizations: Yes    Attends Banker Meetings: More than 4 times per year    Marital Status: Living with partner    Allergies:  Allergies  Allergen Reactions   Apple Juice Anaphylaxis   Other Anaphylaxis and Other (See Comments)    SALMON   Watermelon Flavoring Agent (Non-Screening) Anaphylaxis    Metabolic Disorder Labs: Lab Results  Component Value Date   HGBA1C 5.7 (H) 10/15/2022   MPG 117 10/15/2022   MPG 96.8 06/07/2022   No results found for: "PROLACTIN" Lab Results  Component Value Date   CHOL 181 07/25/2022   TRIG 118 07/25/2022   HDL 45 07/25/2022   CHOLHDL 4.0 07/25/2022   VLDL 24 07/25/2022   LDLCALC 112 (H) 07/25/2022   LDLCALC 88 06/07/2022   Lab Results  Component Value Date   TSH 0.793 09/05/2022   TSH 6.668 (H) 07/25/2022    Therapeutic Level Labs: No results found for: "LITHIUM" No results found for: "VALPROATE" No results found for: "CBMZ"  Current Medications: Current Outpatient Medications  Medication Sig Dispense Refill   traZODone (DESYREL) 50 MG tablet Take 1 tablet (50 mg total) by mouth at bedtime. 30 tablet 2   Current Facility-Administered Medications  Medication Dose Route Frequency Provider Last Rate Last Admin   ARIPiprazole ER (ABILIFY MAINTENA) 400 MG prefilled syringe 400 mg  400 mg Intramuscular Q28 days    400 mg at 01/07/24 1351     Musculoskeletal: Strength & Muscle Tone: within normal limits Gait & Station: normal Patient leans: N/A  Psychiatric Specialty Exam: Review of Systems  Psychiatric/Behavioral:  Negative for confusion. Sleep disturbance: impoved snice  taken sleep aid.The patient is not nervous/anxious and is not hyperactive.   All other systems reviewed and are negative.   There were no vitals taken for this visit.There is no height or weight on file to calculate BMI.  General Appearance: Casual  Eye Contact:  Good  Speech:  Clear and Coherent  Volume:  Normal  Mood:  Anxious and Depressed  Affect:  Congruent  Thought Process:  Coherent  Orientation:  Full (Time, Place, and Person)  Thought Content: Logical   Suicidal Thoughts:  No  Homicidal Thoughts:  No  Memory:  Immediate;   Good Recent;   Good  Judgement:  Good  Insight:  Good  Psychomotor Activity:  Normal  Concentration:  Concentration: Good  Recall:  Good  Fund of Knowledge: Good  Language: Good  Akathisia:  No  Handed:  Right  AIMS (if indicated): done  Assets:  Communication Skills Desire for Improvement Resilience Social Support  ADL's:  Intact  Cognition: WNL  Sleep:  Fair   Screenings:  AIMS    Flowsheet Row Office Visit from 01/30/2023 in [facility]  AIMS Total Score 2      GAD-7    Flowsheet Row Video Visit from 11/07/2023 in [facility]or from 03/20/2023 in [facility] Office Visit from 01[facility]30/2023 in [facility] Office Visit from 12/04/2022 in [facility] Office Visit from 12/17/2021 in [facility] [place]  Total GAD-7 Score 21 16 21 21 17       PHQ2-9    Flowsheet Row Video Visit from 11/07/2023 in [facility] Counselor from 03/20/2023 in [facility] Office Visit from 01/30/2023 in [facility] Office Visit from 12/04/2022 in [facility] ED from 10/07/2022 in [facility]  PHQ-2 Total Score 6 2 3 4  0  PHQ-9 Total Score -- 14 16 20  --       Flowsheet Row ED from 12/17/2023 in [facility] Department at [facility] ED from 12/10/2023 in [facility] ED from 12/07/2023 in [facility] Emergency Department at [facility]  C-SSRS RISK CATEGORY No Risk Moderate Risk No Risk        Assessment and Plan: Follow-up every 28 days for Abilify Maintena 400 mg  -Refill trazodone 50 mg p.o. nightly as needed  Collaboration of Care: Collaboration of Care: Medication Management AEB refilled Trazodone 50 mg nightly   Patient/Guardian was advised Release of Information must be obtained prior to any record release in order to collaborate their care with an outside provider. Patient/Guardian was advised if they have not already done so to contact the registration department to sign all necessary forms in order for [place] to release information regarding their care.   Consent: Patient/Guardian gives verbal consent for treatment and assignment of benefits for services provided during this visit. Patient/Guardian expressed understanding and agreed to proceed.    Oneta Rack, NP 01/07/2024, 3:14 PM

## 2024-01-07 NOTE — Progress Notes (Cosign Needed)
 Pt tolerated injection of 400mg  Abilify Maintenna in left Deltoid with no complaints. Pt denies all AVH,l SI, and HI.   Ppt states that she is doing good overall and that medicine is a good fit for her

## 2024-01-13 NOTE — Progress Notes (Signed)
 "  NEW GYNECOLOGY PATIENT Patient name: Nicole [name] MRN [id number]  Date of birth: 1991/07/16 Chief Complaint:   No chief complaint on file.     History:  Nicole [name] is a 33 y.o. G1P1001 being seen today for amenorrhea.   Discussed the use of AI scribe software for clinical note transcription with the patient, who gave verbal consent to proceed.  History of Present Illness Nicole [name] is a 33 year old female who presents with amenorrhea and breast leakage.  She has not had a regular menstrual period since the 13th of last month and is uncertain if her current bleeding is a period. She has been using a Nexplanon  implant for contraception for about four years. She is currently sexually active and contemplating future pregnancy plans.  She has been experiencing breast leakage described as 'white serous stuff' when touched, occurring intermittently over the past few days. No recent changes in medication have been made, and she reports losing weight more quickly than usual. There are no lumps or bumps noted in the breasts, and the leakage is occurring bilaterally.  She has a history of using the Depo injection in the past, which she associates with weight gain. She is considering switching to birth control pills.  She smokes and denies any history of high blood pressure, migraines, blood clots, breast cancer, stroke, or heart attack.      Gynecologic History Patient's last menstrual period was 12/25/2023. Contraception: Nexplanon  Last Pap: No results found for: DIAGPAP, HPVHIGH, ADEQPAP Last Mammogram: n/a Last Colonoscopy: n/a  Obstetric History OB History  Gravida Para Term Preterm AB Living  1 1 1  0 0 1  SAB IAB Ectopic Multiple Live Births          # Outcome Date GA Lbr Len/2nd Weight Sex Type Anes PTL Lv  1 Term 03/25/18    F Vag-Spont       Past Medical History:  Diagnosis Date   Anxiety    Depression    Hypothyroidism 08/07/2022   Tobacco use disorder  08/07/2022    Past Surgical History:  Procedure Laterality Date   WISDOM TOOTH EXTRACTION Bilateral 2020    Current Outpatient Medications on File Prior to Visit  Medication Sig Dispense Refill   traZODone  (DESYREL ) 50 MG tablet Take 1 tablet (50 mg total) by mouth at bedtime. 30 tablet 2   Current Facility-Administered Medications on File Prior to Visit  Medication Dose Route Frequency Provider Last Rate Last Admin   ARIPiprazole  ER (ABILIFY  MAINTENA) 400 MG prefilled syringe 400 mg  400 mg Intramuscular Q28 days    400 mg at 01/07/24 1351    Allergies  Allergen Reactions   Apple Juice Anaphylaxis   Other Anaphylaxis and Other (See Comments)    SALMON   Watermelon Flavoring Agent (Non-Screening) Anaphylaxis    Social History:  reports that she has been smoking cigarettes. She has never used smokeless tobacco. She reports current alcohol use. She reports that she does not use drugs.  Family History  Problem Relation Age of Onset   Hypertension Father    Diabetes Father     The following portions of the patient's history were reviewed and updated as appropriate: allergies, current medications, past family history, past medical history, past social history, past surgical history and problem list.  Review of Systems Pertinent items noted in HPI and remainder of comprehensive ROS otherwise negative.  Physical Exam:  BP 126/79   Pulse 96   Ht 5' 1 (1.549 m)  Wt 199 lb 3.2 oz (90.4 kg)   LMP 12/25/2023   BMI 37.64 kg/m  Physical Exam Vitals and nursing note reviewed.  Constitutional:      Appearance: Normal appearance.  Cardiovascular:     Rate and Rhythm: Normal rate.  Pulmonary:     Effort: Pulmonary effort is normal.     Breath sounds: Normal breath sounds.  Chest:  Breasts:    Right: Tenderness present. No bleeding, inverted nipple, mass, nipple discharge or skin change.     Left: No bleeding, inverted nipple, mass, nipple discharge, skin change or  tenderness.     Comments: Bilateral piercings, well healed  Neurological:     General: No focal deficit present.     Mental Status: She is alert and oriented to person, place, and time.  Psychiatric:        Mood and Affect: Mood normal.        Behavior: Behavior normal.        Thought Content: Thought content normal.        Judgment: Judgment normal.    NEXPLANON  REMOVAL The risks (including infection, bleeding, pain, and uterine perforation) and benefits of the procedure were explained to the patient and Written informed consent was obtained.   Device was palpated in left upper arm. After time out, the skin was cleaned with alcohol and infiltrated with 1cc of 1% lidocaine  with epinephrine was used to infiltrate the skin and subcutaneous tissue deep to the device. The area was cleaned with betadine x3.  Using an 11 blade, the skin was incised and the implant was removed intact.  The implant was shown to the patient.  The skin was cleaned, incision covered with Steri-Strips, and an adhesive bandage.  Arm was wrapped and post procedure instructions provided to the patient.  POP chose as new contraceptive method.     Assessment and Plan:   Assessment & Plan Contraceptive Management Nexplanon  in place for four years. Considering pregnancy. Discussed contraceptive options. Recommended progesterone-only pill due to tobacco use. - Now s/p uncomplicated nexplanon  removal  - Prescribe progesterone-only birth control pill. - Counsel on daily adherence to progesterone-only pill.  Galactorrhea Recent bilateral breast leakage with white serous discharge. Tenderness appreciated on exam without expulsion of discharge.  - Prolactin level today - If normal prolactin, will proceed with breast imaging - Noted more likely benign given bilateral nature of complaint  Tobacco Use Tobacco use increases thromboembolism risk with estrogen-containing contraceptives. - Advise reduction of tobacco  use.   Routine preventative health maintenance measures emphasized. Will return for pap smear Please refer to After Visit Summary for other counseling recommendations.   Follow-up: Return for Annual GYN.      Carter Quarry, MD Obstetrician & Gynecologist, Faculty Practice [facility] [employer], [facility] "

## 2024-01-14 ENCOUNTER — Ambulatory Visit (INDEPENDENT_AMBULATORY_CARE_PROVIDER_SITE_OTHER): Admitting: Obstetrics and Gynecology

## 2024-01-14 ENCOUNTER — Encounter: Payer: Self-pay | Admitting: Obstetrics and Gynecology

## 2024-01-14 VITALS — BP 126/79 | HR 96 | Ht 61.0 in | Wt 199.2 lb

## 2024-01-14 DIAGNOSIS — Z3046 Encounter for surveillance of implantable subdermal contraceptive: Secondary | ICD-10-CM

## 2024-01-14 DIAGNOSIS — N912 Amenorrhea, unspecified: Secondary | ICD-10-CM | POA: Diagnosis not present

## 2024-01-14 DIAGNOSIS — N6452 Nipple discharge: Secondary | ICD-10-CM | POA: Diagnosis not present

## 2024-01-14 DIAGNOSIS — Z3009 Encounter for other general counseling and advice on contraception: Secondary | ICD-10-CM

## 2024-01-14 MED ORDER — NORETHINDRONE 0.35 MG PO TABS: 1.0000 | ORAL_TABLET | Freq: Every day | ORAL | 11 refills | Status: DC

## 2024-01-14 NOTE — Patient Instructions (Signed)
 Your NEXPLANON implant was just removed Here is some helpful information on what you can expect and how to care for your removal site. 24 Hours wear your top bandage The compression bandage helps minimize bruising.  3-5 Days keep your implant site covered While the insertion site is healing, keep the area covered with a smaller bandage.

## 2024-01-14 NOTE — Progress Notes (Unsigned)
 Pt reports amenorrhea for 1 month. Has a period 12/25/23 but having spotting today and breast tenderness, nausea, lightheadedness  and leaking breast today.   Nexplanon placed 2022 Last PAP unknown

## 2024-01-15 ENCOUNTER — Other Ambulatory Visit: Payer: Self-pay | Admitting: Obstetrics and Gynecology

## 2024-01-15 ENCOUNTER — Encounter: Payer: Self-pay | Admitting: Obstetrics and Gynecology

## 2024-01-15 DIAGNOSIS — N6452 Nipple discharge: Secondary | ICD-10-CM

## 2024-01-15 DIAGNOSIS — N644 Mastodynia: Secondary | ICD-10-CM

## 2024-01-15 LAB — PROLACTIN: Prolactin: 0.6 ng/mL — ABNORMAL LOW (ref 4.8–33.4)

## 2024-01-21 ENCOUNTER — Encounter (HOSPITAL_COMMUNITY): Payer: Self-pay | Admitting: Psychiatry

## 2024-01-21 ENCOUNTER — Telehealth (INDEPENDENT_AMBULATORY_CARE_PROVIDER_SITE_OTHER): Payer: Medicare HMO | Admitting: Psychiatry

## 2024-01-21 DIAGNOSIS — F411 Generalized anxiety disorder: Secondary | ICD-10-CM

## 2024-01-21 DIAGNOSIS — F250 Schizoaffective disorder, bipolar type: Secondary | ICD-10-CM | POA: Diagnosis not present

## 2024-01-21 MED ORDER — ABILIFY MAINTENA 400 MG IM PRSY: 400.0000 mg | PREFILLED_SYRINGE | INTRAMUSCULAR | 11 refills | Status: DC

## 2024-01-21 MED ORDER — HYDROXYZINE HCL 10 MG PO TABS: 10.0000 mg | ORAL_TABLET | Freq: Three times a day (TID) | ORAL | 3 refills | Status: DC | PRN

## 2024-01-21 MED ORDER — TRAZODONE HCL 50 MG PO TABS: 50.0000 mg | ORAL_TABLET | Freq: Every day | ORAL | 3 refills | Status: DC

## 2024-01-21 MED ORDER — BUPROPION HCL ER (XL) 150 MG PO TB24: 150.0000 mg | ORAL_TABLET | ORAL | 3 refills | Status: DC

## 2024-01-21 NOTE — Progress Notes (Signed)
 BH MD/PA/NP OP Progress Note Virtual Visit via Video Note  I connected with Nicole [name] on 01/21/24 at  2:30 PM EDT by a video enabled telemedicine application and verified that I am speaking with the correct person using two identifiers.  Location: Patient: Home Provider: Clinic   I discussed the limitations of evaluation and management by telemedicine and the availability of in person appointments. The patient expressed understanding and agreed to proceed.  I provided 30 minutes of non-face-to-face time during this encounter.   01/21/2024 10:53 AM Nicole [name]  MRN:  [id number]  Chief Complaint: "I am anxious, depressed, and over sleep"  HPI: 33 year old female seen today for follow-up psychiatric evaluation.  She has a psychiatric history of schizophrenia, depression, anxiety, insomnia, tobacco use, and borderline intellectual functioning.  She is currently managed on Abilify maintainer 400 mg every 28 days and trazodone 50 mg nightly as needed.  She inform her that her medications are somewhat effective in managing her psychiatric condition.  Today she is well-groomed, pleasant, cooperative, and engaged in conversation.  Patient informed writer that she has been anxious, depressed, and oversleeps.  Patient notes that she worries about if she will be able to maintain her apartment.  She also notes that she worries about life.  Patient informed Clinical research associate that she gets funding from her guardians to pay for her apartment (aunt and uncle).  Patient asked writer if she could write a letter saying that she can be her own payee.  Provider informed patient that this would have to be completed by the courts.  She endorsed understanding and agrees.  She also notes that recently she has been oversleeping a lot.  She denies taking trazodone frequently.  She also denies being pregnant.  Patient informed writer that she sleeps approximately 10 or more hours.    Recently patient notes that she has been  forgetful and disorganized.  She also informed Clinical research associate that she is often distracted, irritable, have fluctuations in mood, and racing thoughts.  Since restarting Abilify as she no longer has auditory or visual hallucinations.  She also denies paranoia.  Today provider conducted a GAD 7 and patient scored a 17, at her last visit she scored a 21. Provider also conducted a PHQ 9 and patient scored a 14, at her last visit she scored a 19.  She reports that her appetite is adequate.  Today she denies SI/HI/AVH.  Patient denies tobacco or illegal drug use.  She does note that she drinks alcohol socially.  Today patient agreeable to starting Wellbutrin XL 150 mg to help manage depression and concentration.  Hydroxyzine 10 mg 3 times daily started to help manage anxiety and sleep.  She will continue trazodone and Abilify Maintena as prescribed.  Potential side effects of medication and risks vs benefits of treatment vs non-treatment were explained and discussed. All questions were answered. No other concerns noted at this time.  Visit Diagnosis:    ICD-10-CM   1. Schizoaffective disorder, bipolar type (HCC)  F25.0 ARIPiprazole ER (ABILIFY MAINTENA) 400 MG PRSY prefilled syringe    2. Anxiety state  F41.1 traZODone (DESYREL) 50 MG tablet    hydrOXYzine (ATARAX) 10 MG tablet    buPROPion (WELLBUTRIN XL) 150 MG 24 hr tablet        Past Psychiatric History: Schizophrenia (unspecified type), Major depressive disorder, Patient reports that she has been hospitalized due to mental health multiple times.  Patient states that she has done stints at [facility] and [facility]  Past  Medical History:  Past Medical History:  Diagnosis Date   Anxiety    Depression    Hypothyroidism 08/07/2022   Tobacco use disorder 08/07/2022    Past Surgical History:  Procedure Laterality Date   WISDOM TOOTH EXTRACTION Bilateral 2020    Family Psychiatric History: mother attempted suicide, Patient reports that her brother used  to abuse marijuana and alcohol. Patient states that her mother used to abuse marijuana and alcohol.   Family History:  Family History  Problem Relation Age of Onset   Hypertension Father    Diabetes Father     Social History:  Social History   Socioeconomic History   Marital status: Single    Spouse name: Not on file   Number of children: Not on file   Years of education: Not on file   Highest education level: Not on file  Occupational History   Not on file  Tobacco Use   Smoking status: Every Day    Types: Cigarettes   Smokeless tobacco: Never  Vaping Use   Vaping status: Some Days  Substance and Sexual Activity   Alcohol use: Yes    Comment: weekly   Drug use: Never   Sexual activity: Yes    Partners: Female  Other Topics Concern   Not on file  Social History Narrative   Not on file   Social Drivers of Health   Financial Resource Strain: Low Risk  (12/13/2022)   Overall Financial Resource Strain (CARDIA)    Difficulty of Paying Living Expenses: Not hard at all  Food Insecurity: No Food Insecurity (12/13/2022)   Hunger Vital Sign    Worried About Running Out of Food in the Last Year: Never true    Ran Out of Food in the Last Year: Never true  Transportation Needs: Unmet Transportation Needs (03/03/2023)   PRAPARE - Transportation    Lack of Transportation (Medical): Yes    Lack of Transportation (Non-Medical): Yes  Physical Activity: Inactive (12/13/2022)   Exercise Vital Sign    Days of Exercise per Week: 0 days    Minutes of Exercise per Session: 0 min  Stress: Stress Concern Present (12/13/2022)   [employer] of Occupational Health - Occupational Stress Questionnaire    Feeling of Stress : Very much  Social Connections: Socially Integrated (12/13/2022)   Social Connection and Isolation Panel [NHANES]    Frequency of Communication with Friends and Family: More than three times a week    Frequency of Social Gatherings with Friends and Family: Three times a  week    Attends Religious Services: More than 4 times per year    Active Member of Clubs or Organizations: Yes    Attends Banker Meetings: More than 4 times per year    Marital Status: Living with partner    Allergies:  Allergies  Allergen Reactions   Apple Juice Anaphylaxis   Other Anaphylaxis and Other (See Comments)    SALMON   Watermelon Flavoring Agent (Non-Screening) Anaphylaxis    Metabolic Disorder Labs: Lab Results  Component Value Date   HGBA1C 5.7 (H) 10/15/2022   MPG 117 10/15/2022   MPG 96.8 06/07/2022   Lab Results  Component Value Date   PROLACTIN 0.6 (L) 01/14/2024   Lab Results  Component Value Date   CHOL 181 07/25/2022   TRIG 118 07/25/2022   HDL 45 07/25/2022   CHOLHDL 4.0 07/25/2022   VLDL 24 07/25/2022   LDLCALC 112 (H) 07/25/2022   LDLCALC 88 06/07/2022  Lab Results  Component Value Date   TSH 0.793 09/05/2022   TSH 6.668 (H) 07/25/2022    Therapeutic Level Labs: No results found for: "LITHIUM" No results found for: "VALPROATE" No results found for: "CBMZ"  Current Medications: Current Outpatient Medications  Medication Sig Dispense Refill   ARIPiprazole ER (ABILIFY MAINTENA) 400 MG PRSY prefilled syringe Inject 400 mg into the muscle every 28 (twenty-eight) days. 1 each 11   buPROPion (WELLBUTRIN XL) 150 MG 24 hr tablet Take 1 tablet (150 mg total) by mouth every morning. 30 tablet 3   hydrOXYzine (ATARAX) 10 MG tablet Take 1 tablet (10 mg total) by mouth 3 (three) times daily as needed. 90 tablet 3   norethindrone (MICRONOR) 0.35 MG tablet Take 1 tablet (0.35 mg total) by mouth daily. 28 tablet 11   traZODone (DESYREL) 50 MG tablet Take 1 tablet (50 mg total) by mouth at bedtime. 30 tablet 3   Current Facility-Administered Medications  Medication Dose Route Frequency Provider Last Rate Last Admin   ARIPiprazole ER (ABILIFY MAINTENA) 400 MG prefilled syringe 400 mg  400 mg Intramuscular Q28 days    400 mg at 01/07/24  1351     Musculoskeletal: Strength & Muscle Tone: within normal limits and telehealth visit Gait & Station: normal, telehealth visit Patient leans: N/A  Psychiatric Specialty Exam: Review of Systems  Last menstrual period 12/25/2023.There is no height or weight on file to calculate BMI.  General Appearance: Well Groomed  Eye Contact:  Good  Speech:  Clear and Coherent and Slow  Volume:  Normal  Mood:  Anxious and Depressed  Affect:  Depressed and Flat  Thought Process:  Coherent, Goal Directed, and Linear  Orientation:  Full (Time, Place, and Person)  Thought Content: Logical and Hallucinations: Auditory   Suicidal Thoughts:  Yes.  without intent/plan  Homicidal Thoughts:  No  Memory:  Immediate;   Good Recent;   Good Remote;   Good  Judgement:  Good  Insight:  Good  Psychomotor Activity:  Normal  Concentration:  Concentration: Good and Attention Span: Good  Recall:  Good  Fund of Knowledge: Good  Language: Good  Akathisia:  No  Handed:  Right  AIMS (if indicated): not done  Assets:  Communication Skills Desire for Improvement Housing Leisure Time Physical Health Social Support  ADL's:  Intact  Cognition: WNL  Sleep:  Fair   Screenings: AIMS    Flowsheet Row Office Visit from 01/30/2023 in [facility]  AIMS Total Score 2      GAD-7    Flowsheet Row Video Visit from 01/21/2024 in [facility] Video Visit from 11/07/2023 in [facility] Counselor from 03/20/2023 in [facility] Office Visit from 01/30/2023 in [facility] Office Visit from 12/04/2022 in [facility]  Total GAD-7 Score 17 21 16 21 21       PHQ2-9    Flowsheet Row Video Visit from 01/21/2024 in [facility] Video Visit from 11/07/2023 in [facility] Counselor from 03/20/2023 in  [facility] Office Visit from 01/30/2023 in [facility] Office Visit from 12/04/2022 in [place]  PHQ-2 Total Score 2 6 2 3 4   PHQ-9 Total Score 14 -- 14 16 20       Flowsheet Row Video Visit from 01/21/2024 in [facility] ED from 12/17/2023  in [facility] Emergency Department at [facility] ED from 12/10/2023 in [facility]  C-SSRS RISK CATEGORY Error: Q7 should not be populated when Q6 is No No Risk Moderate Risk        Assessment and Plan: Patient endorses symptoms of hypersomnia, anxiety and depression.  Today patient agreeable to starting Wellbutrin XL 150 mg to help manage depression and concentration.  Hydroxyzine 10 mg 3 times daily started to help manage anxiety and sleep.  She will continue trazodone and Abilify Maintena as prescribed   1. Schizoaffective disorder, bipolar type (HCC) (Primary)  Continue- ARIPiprazole ER (ABILIFY MAINTENA) 400 MG PRSY prefilled syringe; Inject 400 mg into the muscle every 28 (twenty-eight) days.  Dispense: 1 each; Refill: 11  2. Anxiety state  Continue- traZODone (DESYREL) 50 MG tablet; Take 1 tablet (50 mg total) by mouth at bedtime.  Dispense: 30 tablet; Refill: 3 Start- hydrOXYzine (ATARAX) 10 MG tablet; Take 1 tablet (10 mg total) by mouth 3 (three) times daily as needed.  Dispense: 90 tablet; Refill: 3 Start- buPROPion (WELLBUTRIN XL) 150 MG 24 hr tablet; Take 1 tablet (150 mg total) by mouth every morning.  Dispense: 30 tablet; Refill: 3  Collaboration of Care: Collaboration of Care: Other provider involved in patient's care AEB counselor, PCP, shock clinic staff  Patient/Guardian was advised Release of Information must be obtained prior to any record release in order to collaborate their care with an outside provider. Patient/Guardian was advised if they have not already done so to contact the  registration department to sign all necessary forms in order for [place] to release information regarding their care.   Consent: Patient/Guardian gives verbal consent for treatment and assignment of benefits for services provided during this visit. Patient/Guardian expressed understanding and agreed to proceed.   Follow-up in 2-1/2 months Follow-up with shot clinic in 1 month Shanna Cisco, NP 01/21/2024, 10:53 AM

## 2024-01-24 ENCOUNTER — Ambulatory Visit (HOSPITAL_COMMUNITY)
Admission: EM | Admit: 2024-01-24 | Discharge: 2024-01-26 | Disposition: A | Discharge status: Other Acute Inpatient Hospital | Attending: Psychiatry | Admitting: Psychiatry

## 2024-01-24 DIAGNOSIS — F250 Schizoaffective disorder, bipolar type: Secondary | ICD-10-CM | POA: Insufficient documentation

## 2024-01-24 DIAGNOSIS — F259 Schizoaffective disorder, unspecified: Secondary | ICD-10-CM | POA: Diagnosis present

## 2024-01-24 LAB — COMPREHENSIVE METABOLIC PANEL WITH GFR
ALT: 14 U/L (ref 0–44)
AST: 22 U/L (ref 15–41)
Albumin: 4 g/dL (ref 3.5–5.0)
Alkaline Phosphatase: 48 U/L (ref 38–126)
Anion gap: 9 (ref 5–15)
BUN: <5 mg/dL — ABNORMAL LOW (ref 6–20)
CO2: 24 mmol/L (ref 22–32)
Calcium: 9.4 mg/dL (ref 8.9–10.3)
Chloride: 103 mmol/L (ref 98–111)
Creatinine, Ser: 0.89 mg/dL (ref 0.44–1.00)
GFR, Estimated: >60 mL/min (ref >60)
Glucose, Bld: 88 mg/dL (ref 70–99)
Potassium: 3.7 mmol/L (ref 3.5–5.1)
Sodium: 136 mmol/L (ref 135–145)
Total Bilirubin: 0.6 mg/dL (ref 0.0–1.2)
Total Protein: 7.5 g/dL (ref 6.5–8.1)

## 2024-01-24 LAB — CBC WITH DIFFERENTIAL/PLATELET
Abs Immature Granulocytes: 0.02 10*3/uL (ref 0.00–0.07)
Basophils Absolute: 0 10*3/uL (ref 0.0–0.1)
Basophils Relative: 0 %
Eosinophils Absolute: 0 10*3/uL (ref 0.0–0.5)
Eosinophils Relative: 0 %
HCT: 39 % (ref 36.0–46.0)
Hemoglobin: 12.8 g/dL (ref 12.0–15.0)
Immature Granulocytes: 0 %
Lymphocytes Relative: 50 %
Lymphs Abs: 4 10*3/uL (ref 0.7–4.0)
MCH: 29.2 pg (ref 26.0–34.0)
MCHC: 32.8 g/dL (ref 30.0–36.0)
MCV: 88.8 fL (ref 80.0–100.0)
Monocytes Absolute: 0.4 10*3/uL (ref 0.1–1.0)
Monocytes Relative: 5 %
Neutro Abs: 3.6 10*3/uL (ref 1.7–7.7)
Neutrophils Relative %: 45 %
Platelets: 368 10*3/uL (ref 150–400)
RBC: 4.39 MIL/uL (ref 3.87–5.11)
RDW: 14 % (ref 11.5–15.5)
WBC: 8.1 10*3/uL (ref 4.0–10.5)
nRBC: 0 % (ref 0.0–0.2)

## 2024-01-24 LAB — LIPID PANEL
Cholesterol: 165 mg/dL (ref 0–200)
HDL: 53 mg/dL (ref >40)
LDL Cholesterol: 102 mg/dL — ABNORMAL HIGH (ref 0–99)
Total CHOL/HDL Ratio: 3.1 ratio
Triglycerides: 49 mg/dL (ref <150)
VLDL: 10 mg/dL (ref 0–40)

## 2024-01-24 LAB — POCT URINE DRUG SCREEN - MANUAL ENTRY (I-SCREEN)
POC Amphetamine UR: NOT DETECTED
POC Buprenorphine (BUP): NOT DETECTED
POC Cocaine UR: NOT DETECTED
POC Marijuana UR: POSITIVE — AB
POC Methadone UR: NOT DETECTED
POC Methamphetamine UR: NOT DETECTED
POC Morphine: NOT DETECTED
POC Oxazepam (BZO): NOT DETECTED
POC Oxycodone UR: NOT DETECTED
POC Secobarbital (BAR): NOT DETECTED

## 2024-01-24 LAB — TSH: TSH: 2.55 u[IU]/mL (ref 0.350–4.500)

## 2024-01-24 LAB — ETHANOL: Alcohol, Ethyl (B): <10 mg/dL (ref <10)

## 2024-01-24 LAB — HEMOGLOBIN A1C
Hgb A1c MFr Bld: 5.1 % (ref 4.8–5.6)
Mean Plasma Glucose: 99.67 mg/dL

## 2024-01-24 LAB — POC URINE PREG, ED: Preg Test, Ur: NEGATIVE — NL

## 2024-01-24 MED ORDER — HALOPERIDOL 5 MG PO TABS: 5.0000 mg | ORAL_TABLET | Freq: Three times a day (TID) | ORAL | Status: DC | PRN

## 2024-01-24 MED ORDER — LORAZEPAM 2 MG/ML IJ SOLN: 2.0000 mg | Freq: Three times a day (TID) | INTRAMUSCULAR | Status: DC | PRN

## 2024-01-24 MED ORDER — DIPHENHYDRAMINE HCL 50 MG/ML IJ SOLN: 50.0000 mg | Freq: Three times a day (TID) | INTRAMUSCULAR | Status: DC | PRN

## 2024-01-24 MED ORDER — TRAZODONE HCL 50 MG PO TABS
50.0000 mg | ORAL_TABLET | Freq: Every evening | ORAL | Status: DC | PRN
  Administered 2024-01-24 – 2024-01-25 (×2): 50 mg via ORAL
  Filled 2024-01-24 (×2): qty 1

## 2024-01-24 MED ORDER — ALUM & MAG HYDROXIDE-SIMETH 200-200-20 MG/5ML PO SUSP: 30.0000 mL | ORAL | Status: DC | PRN

## 2024-01-24 MED ORDER — ACETAMINOPHEN 325 MG PO TABS
650.0000 mg | ORAL_TABLET | Freq: Four times a day (QID) | ORAL | Status: DC | PRN
  Administered 2024-01-25: 650 mg via ORAL
  Filled 2024-01-24: qty 2

## 2024-01-24 MED ORDER — HALOPERIDOL LACTATE 5 MG/ML IJ SOLN: 5.0000 mg | Freq: Three times a day (TID) | INTRAMUSCULAR | Status: DC | PRN

## 2024-01-24 MED ORDER — BUPROPION HCL ER (XL) 150 MG PO TB24
150.0000 mg | ORAL_TABLET | ORAL | Status: DC
  Administered 2024-01-25 – 2024-01-26 (×2): 150 mg via ORAL
  Filled 2024-01-24 (×2): qty 1

## 2024-01-24 MED ORDER — HYDROXYZINE HCL 25 MG PO TABS
25.0000 mg | ORAL_TABLET | Freq: Three times a day (TID) | ORAL | Status: DC | PRN
  Administered 2024-01-24 – 2024-01-25 (×2): 25 mg via ORAL
  Filled 2024-01-24 (×2): qty 1

## 2024-01-24 MED ORDER — DIPHENHYDRAMINE HCL 50 MG PO CAPS: 50.0000 mg | ORAL_CAPSULE | Freq: Three times a day (TID) | ORAL | Status: DC | PRN

## 2024-01-24 MED ORDER — NORETHINDRONE 0.35 MG PO TABS: 1.0000 | ORAL_TABLET | Freq: Every day | ORAL | Status: DC

## 2024-01-24 MED ORDER — MAGNESIUM HYDROXIDE 400 MG/5ML PO SUSP: 30.0000 mL | Freq: Every day | ORAL | Status: DC | PRN

## 2024-01-24 MED ORDER — HALOPERIDOL LACTATE 5 MG/ML IJ SOLN: 10.0000 mg | Freq: Three times a day (TID) | INTRAMUSCULAR | Status: DC | PRN
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
## 2024-01-25 DIAGNOSIS — F250 Schizoaffective disorder, bipolar type: Secondary | ICD-10-CM

## 2024-01-25 MED ORDER — IBUPROFEN 600 MG PO TABS
600.0000 mg | ORAL_TABLET | Freq: Once | ORAL | Status: AC
  Administered 2024-01-25: 600 mg via ORAL
  Filled 2024-01-25: qty 1
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
## 2024-01-25 NOTE — ED Notes (Signed)
 The patient is sitting in the recliner, socializing with other pts. No acute distress noted. Environment is secured. Will continue to monitor for safety.

## 2024-01-25 NOTE — ED Notes (Signed)
 Pt observed/assessed in recliner sleeping. RR even and unlabored, appearing in no noted distress. Environmental check complete, will continue to monitor for safety

## 2024-01-25 NOTE — Progress Notes (Signed)
 [facility] [name] Progress Note   01/25/2024    12:05 PM  Lizette Pazos   [id number]   Type of Contact and Topic:  Psychiatric Bed Placement   Pt accepted to [facility]    Patient meets inpatient criteria per Cicero Crawley, NP  The attending provider will be Dr. Drucie Georgia  Call report to [phone]  Bo Burly, LPN @ [facility] notified.     Pt scheduled  to arrive at [facility] for TOMORROW.    Phares Brasher, MSW, [name]-A  12:06 PM 01/25/2024
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
## 2024-01-25 NOTE — ED Notes (Signed)
 [name]&Ox4. Denies intent/thoughts to harm self/others when asked. Denies A/VH. Patient denies any physical complaints when asked. No acute distress noted. Reports feeling scared, lost, and anxious about losing her current housing. Support and encouragement provided. Routine safety checks conducted according to facility protocol. Encouraged patient to notify staff if thoughts of harm toward self or others arise. Endorses safety. Patient verbalized understanding and agreement. Will continue to monitor for safety.

## 2024-01-25 NOTE — Progress Notes (Signed)
 Patient has been denied by [facility] due to no appropriate beds available. Patient meets BH inpatient criteria per Cicero Crawley, NP. Patient has been faxed out to the following facilities:   . [facility] [address] [place] [address] [phone]  [facility] [address] [place] [address] [phone]  [facility] [address], [place] [address] [phone]  [facility] [address] [place], [place] [address] [phone]  [facility] Patient Placement [facility], [place] [phone]  [facility] [address] [place] [address] [phone]  [facility] [address] [place] [address] [phone]  [facility] Josephina N[place] [address] [phone]  [facility] [address] [place] [address] [phone]  [facility] EFAX [address] [place], [place] [phone]  [facility] [address] Johnella Naas [place] [address] [phone]  [facility] [address] [place] [address] [phone]  [facility] [address] [place] [address] [phone]  [facility] [address] [place], [place] [address] [phone]  [facility] [address] [place] [address] [phone]  [facility] [address] [place] [address] [phone]  [facility] [address] [place] [address] [phone]  [facility] [address], [place] [address] [phone]  [facility] [address] [place] [address] [phone]    Phares Brasher, MSW, [name]-A  11:34 AM 01/25/2024

## 2024-01-25 NOTE — ED Notes (Signed)
 Patient resting with eyes closed. Respirations even and unlabored. No acute distress noted. Environment secured. Will continue to monitor for safety.

## 2024-01-26 DIAGNOSIS — F250 Schizoaffective disorder, bipolar type: Secondary | ICD-10-CM | POA: Diagnosis not present

## 2024-01-26 LAB — URINALYSIS, ROUTINE W REFLEX MICROSCOPIC
Bilirubin Urine: NEGATIVE
Glucose, UA: NEGATIVE mg/dL
Hgb urine dipstick: NEGATIVE
Ketones, ur: NEGATIVE mg/dL
Leukocytes,Ua: NEGATIVE
Nitrite: NEGATIVE
Protein, ur: 30 mg/dL — AB
Specific Gravity, Urine: 1.026 (ref 1.005–1.030)
pH: 5 (ref 5.0–8.0)

## 2024-01-26 LAB — PROLACTIN: Prolactin: 2.1 ng/mL — ABNORMAL LOW (ref 4.8–33.4)

## 2024-01-26 MED ORDER — VALACYCLOVIR HCL 500 MG PO TABS
500.0000 mg | ORAL_TABLET | Freq: Every day | ORAL | Status: DC
  Administered 2024-01-26: 500 mg via ORAL
  Filled 2024-01-26: qty 1

## 2024-01-26 NOTE — ED Notes (Signed)
 Stable. A&O x 4.  Transferring to [facility] via safe transport    Denies current SI plan and Intent.  Denies HI and A/V hallucinations.   2 bags patient belongings sent with patient and safe transport

## 2024-01-26 NOTE — ED Notes (Signed)
 Pt presented in hall way after taking a shower.  Bright affect.  Pt informed of placement at [facility].  No compliants or concerns verbalized at present.  Denied Current SI plan and intent.  Denied HI and A/V hallucinations

## 2024-01-26 NOTE — ED Notes (Signed)
 Pt observed/assessed in recliner sleeping. RR even and unlabored, appearing in no noted distress. Environmental check complete, will continue to monitor for safety

## 2024-01-26 NOTE — ED Notes (Signed)
 Attempted to call [facility] [place] Hoot, Father, [phone].  No answer,  Left VM

## 2024-01-26 NOTE — ED Notes (Signed)
 [facility] called to check on transportation status for patient, this nurse advised safe transport will be called on dayshift to pick up and transport patient.

## 2024-01-29 ENCOUNTER — Encounter: Payer: Self-pay | Admitting: Obstetrics and Gynecology

## 2024-02-04 ENCOUNTER — Ambulatory Visit (HOSPITAL_COMMUNITY)

## 2024-02-12 ENCOUNTER — Telehealth (HOSPITAL_COMMUNITY): Payer: Self-pay

## 2024-02-12 NOTE — Telephone Encounter (Signed)
 Medication management - Call with patient, to follow up with her on missed due injection 02/04/24 and to attempt to get patient rescheduled. Patient stated she had called EMS on 02/10/24 and that they showed her boyfriend how to give her the injection that night as she felt she needed it then and had not rescheduled with [facility] outpatient yet at that time.  Patient reported doing fine now and no issues.  Expressed concern for patient missing appointments and allowing someone else to do her injections.  Informed of concerns for her safety in allowing someone not trained to do her injection and patient stated understanding.  Patient stated that is why she had EMS show her boyfriend how to do the injection that night as they could not without an order.  Agreed to have our staff call back to schedule for next injection in 28 days and informed Dr. Melisa Spray of patients report of having her BF do her injection with EMS on 02/10/24.  Informed this nurse told her of concerns for allowing this and that patient agreed to not do this again later and to come into the [facility] to have our staff assist.  Patient to return next on or after 03/09/24 for next due injection and approval by Dr. Melisa Spray with agreement to insure documenting of call with patient this date reporting injection was given to her on 02/10/24.

## 2024-02-17 ENCOUNTER — Encounter (HOSPITAL_COMMUNITY): Payer: Self-pay

## 2024-02-17 ENCOUNTER — Other Ambulatory Visit: Payer: Self-pay

## 2024-02-17 ENCOUNTER — Ambulatory Visit (HOSPITAL_COMMUNITY)
Admission: EM | Admit: 2024-02-17 | Discharge: 2024-02-17 | Disposition: A | Discharge status: Home / Self Care | Attending: Family Medicine | Admitting: Family Medicine

## 2024-02-17 DIAGNOSIS — Z3201 Encounter for pregnancy test, result positive: Secondary | ICD-10-CM

## 2024-02-17 LAB — POCT URINE PREGNANCY: Preg Test, Ur: POSITIVE — AB

## 2024-02-17 NOTE — Discharge Instructions (Signed)
  1. Positive pregnancy test (Primary) - POCT urine pregnancy completed in UC is confirmed positive for pregnancy. - Follow-up with OB/GYN for further evaluation and ongoing management of pregnancy. - Medication list reviewed and Abilify , sertraline , and trazodone  are safe medications until third trimester, weigh risks versus benefits during pregnancy. - Follow-up with OB/GYN to discuss what medications should be continued and discontinued in at what point during pregnancy. -Continue to monitor symptoms for any change in severity if there is any escalation of current symptoms or development of new symptoms follow-up in [facility] at [facility] for further evaluation and management.

## 2024-02-17 NOTE — ED Provider Notes (Signed)
 [facility]  Note:  This document was prepared using Dragon voice recognition software and may include unintentional dictation errors.  MRN: [id number] DOB: [birth year]  Subjective:   Nicole [name] is a 33 y.o. female presenting for pregnancy testing.  Patient reports that she took a home pregnancy test which was positive and she would like confirmatory testing done in urgent care today.  Patient is also concern for if she is positive for pregnancy what medications she can take during her pregnancy.  Patient denies any dysuria, vaginal discharge, abdominal pain, flank pain, nausea/vomiting, diarrhea.   Current Facility-Administered Medications:    ARIPiprazole  ER (ABILIFY  MAINTENA) 400 MG prefilled syringe 400 mg, 400 mg, Intramuscular, Q28 days, , 400 mg at 01/07/24 1351  Current Outpatient Medications:    ARIPiprazole  ER (ABILIFY  MAINTENA) 400 MG PRSY prefilled syringe, Inject 400 mg into the muscle every 28 (twenty-eight) days., Disp: 1 each, Rfl: 11   buPROPion  (WELLBUTRIN  XL) 150 MG 24 hr tablet, Take 1 tablet (150 mg total) by mouth every morning. (Patient not taking: Reported on 01/25/2024), Disp: 30 tablet, Rfl: 3   hydrOXYzine  (ATARAX ) 10 MG tablet, Take 1 tablet (10 mg total) by mouth 3 (three) times daily as needed. (Patient not taking: Reported on 01/25/2024), Disp: 90 tablet, Rfl: 3   methocarbamol  (ROBAXIN ) 500 MG tablet, Take 500 mg by mouth 3 (three) times daily as needed., Disp: , Rfl:    norethindrone  (MICRONOR ) 0.35 MG tablet, Take 1 tablet (0.35 mg total) by mouth daily. (Patient not taking: Reported on 01/25/2024), Disp: 28 tablet, Rfl: 11   sertraline  (ZOLOFT ) 100 MG tablet, Take 100 mg by mouth at bedtime., Disp: , Rfl:    traZODone  (DESYREL ) 50 MG tablet, Take 1 tablet (50 mg total) by mouth at bedtime., Disp: 30 tablet, Rfl: 3   valACYclovir  (VALTREX ) 500 MG tablet, Take 500 mg by mouth daily., Disp: , Rfl:    Allergies  Allergen Reactions   Apple Juice  Anaphylaxis   Other Anaphylaxis and Other (See Comments)    SALMON   Watermelon Flavoring Agent (Non-Screening) Anaphylaxis    Past Medical History:  Diagnosis Date   Anxiety    Depression    Hypothyroidism 08/07/2022   Tobacco use disorder 08/07/2022     Past Surgical History:  Procedure Laterality Date   WISDOM TOOTH EXTRACTION Bilateral 2020    Family History  Problem Relation Age of Onset   Hypertension Father    Diabetes Father     Social History   Tobacco Use   Smoking status: Every Day    Types: Cigarettes   Smokeless tobacco: Never  Vaping Use   Vaping status: Some Days  Substance Use Topics   Alcohol use: Yes    Comment: weekly   Drug use: Never    ROS Refer to HPI for ROS details.  Objective:   Vitals: BP 100/80 (BP Location: Left Arm)   Pulse 85   Temp 98.8 F (37.1 C) (Oral)   Resp 20   LMP 01/13/2024 (Exact Date)   SpO2 98%   Physical Exam Vitals and nursing note reviewed.  Constitutional:      General: She is not in acute distress.    Appearance: She is well-developed. She is not ill-appearing or toxic-appearing.  HENT:     Head: Normocephalic and atraumatic.  Cardiovascular:     Rate and Rhythm: Normal rate.  Pulmonary:     Effort: Pulmonary effort is normal. No respiratory distress.  Abdominal:  Palpations: Abdomen is soft.     Tenderness: There is no abdominal tenderness. There is no right CVA tenderness or left CVA tenderness.  Skin:    General: Skin is warm and dry.  Neurological:     General: No focal deficit present.     Mental Status: She is alert and oriented to person, place, and time.  Psychiatric:        Mood and Affect: Mood normal.        Behavior: Behavior normal.        Thought Content: Thought content normal.        Judgment: Judgment normal.     Procedures  Results for orders placed or performed during the hospital encounter of 02/17/24 (from the past 24 hours)  POCT urine pregnancy     Status:  Abnormal   Collection Time: 02/17/24  2:22 PM  Result Value Ref Range   Preg Test, Ur Positive (A) Negative    No results found.   Assessment and Plan :     Discharge Instructions       1. Positive pregnancy test (Primary) - POCT urine pregnancy completed in UC is confirmed positive for pregnancy. - Follow-up with OB/GYN for further evaluation and ongoing management of pregnancy. - Medication list reviewed and Abilify , sertraline , and trazodone  are safe medications until third trimester, weigh risks versus benefits during pregnancy. - Follow-up with OB/GYN to discuss what medications should be continued and discontinued in at what point during pregnancy. -Continue to monitor symptoms for any change in severity if there is any escalation of current symptoms or development of new symptoms follow-up in [facility] for further evaluation and management.      Nicole [name]   Hakan Nudelman, [place], [place] 02/17/24 1459

## 2024-02-17 NOTE — ED Triage Notes (Signed)
 Pt states she took a pregnancy test today and it was positive. Pt states she needs it confirmed by a dr. Pt is experiencing nausea. LMP 01/13/2024.

## 2024-02-18 ENCOUNTER — Other Ambulatory Visit (INDEPENDENT_AMBULATORY_CARE_PROVIDER_SITE_OTHER): Payer: Self-pay

## 2024-02-18 ENCOUNTER — Ambulatory Visit (INDEPENDENT_AMBULATORY_CARE_PROVIDER_SITE_OTHER): Admitting: *Deleted

## 2024-02-18 VITALS — BP 115/81 | HR 79 | Wt 191.8 lb

## 2024-02-18 DIAGNOSIS — B009 Herpesviral infection, unspecified: Secondary | ICD-10-CM

## 2024-02-18 DIAGNOSIS — Z1339 Encounter for screening examination for other mental health and behavioral disorders: Secondary | ICD-10-CM | POA: Diagnosis not present

## 2024-02-18 DIAGNOSIS — O3680X0 Pregnancy with inconclusive fetal viability, not applicable or unspecified: Secondary | ICD-10-CM | POA: Diagnosis not present

## 2024-02-18 DIAGNOSIS — E119 Type 2 diabetes mellitus without complications: Secondary | ICD-10-CM | POA: Insufficient documentation

## 2024-02-18 DIAGNOSIS — O3680X1 Pregnancy with inconclusive fetal viability, fetus 1: Secondary | ICD-10-CM | POA: Diagnosis not present

## 2024-02-18 DIAGNOSIS — O0990 Supervision of high risk pregnancy, unspecified, unspecified trimester: Secondary | ICD-10-CM

## 2024-02-18 DIAGNOSIS — Z3A01 Less than 8 weeks gestation of pregnancy: Secondary | ICD-10-CM

## 2024-02-18 DIAGNOSIS — I10 Essential (primary) hypertension: Secondary | ICD-10-CM | POA: Insufficient documentation

## 2024-02-18 DIAGNOSIS — Z3A00 Weeks of gestation of pregnancy not specified: Secondary | ICD-10-CM | POA: Diagnosis not present

## 2024-02-18 DIAGNOSIS — O24112 Pre-existing diabetes mellitus, type 2, in pregnancy, second trimester: Secondary | ICD-10-CM | POA: Insufficient documentation

## 2024-02-18 MED ORDER — VALACYCLOVIR HCL 500 MG PO TABS: 500.0000 mg | ORAL_TABLET | Freq: Every day | ORAL | 8 refills | Status: AC

## 2024-02-18 MED ORDER — FOLIC ACID 1 MG PO TABS
1.0000 mg | ORAL_TABLET | Freq: Every day | ORAL | 10 refills | Status: DC
Start: 2024-02-18 — End: 2024-05-06

## 2024-02-18 NOTE — Progress Notes (Signed)
 New OB Intake  I connected with Nicole [name]  on 02/18/24 at  8:15 AM EDT by In Person Visit and verified that I am speaking with the correct person using two identifiers. Nurse is located at [facility] and pt is located at [place].  I discussed the limitations, risks, security and privacy concerns of performing an evaluation and management service by telephone and the availability of in person appointments. I also discussed with the patient that there may be a patient responsible charge related to this service. The patient expressed understanding and agreed to proceed.  I explained I am completing New OB Intake today. We discussed EDD of 10/19/2024, by Last Menstrual Period. Pt is G2P1001. I reviewed her allergies, medications and Medical/Surgical/OB history.    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 Discussed the nature of our practice with multiple providers including residents and students. Due to the size of the practice, the delivering provider may not be the same as those providing prenatal care.   Patient is not interested in water birth.  MyChart/Babyscripts MyChart access verified. I explained pt will have some visits in office and some virtually. Babyscripts instructions given and order placed. Patient verifies receipt of  registration text/e-mail. Account successfully created and app downloaded. If patient is a candidate for Optimized scheduling, add to sticky note.   Blood Pressure Cuff/Weight Scale Blood pressure cuff ordered for patient to pick-up from [employer]. Explained after first prenatal appt pt will check weekly and document in Babyscripts. Patient does not have weight scale; patient may purchase if they desire to track weight weekly in Babyscripts.  Anatomy US  Explained first scheduled US  will be around 19 weeks. Anatomy US  scheduled for NA at NA/ viability not established.  Is patient a candidate for Babyscripts Optimization? No, due to Risk Factors   First visit review I reviewed new OB appt with patient. Explained pt will be seen by TBD/ viability not established at first visit. Discussed [facility] [place] genetic screening with patient. Requests Panorama and Horizon.. Routine prenatal labs  not collected/ viability not established.    Last Pap No results found for: "DIAGPAP"  Donette Furlong, RN 02/18/2024  9:11 AM

## 2024-02-19 ENCOUNTER — Inpatient Hospital Stay (HOSPITAL_COMMUNITY)
Admission: AD | Admit: 2024-02-19 | Discharge: 2024-02-19 | Disposition: A | Discharge status: Home / Self Care | Attending: Obstetrics and Gynecology | Admitting: Obstetrics and Gynecology

## 2024-02-19 DIAGNOSIS — O99281 Endocrine, nutritional and metabolic diseases complicating pregnancy, first trimester: Secondary | ICD-10-CM | POA: Diagnosis not present

## 2024-02-19 DIAGNOSIS — R519 Headache, unspecified: Secondary | ICD-10-CM | POA: Diagnosis not present

## 2024-02-19 DIAGNOSIS — M549 Dorsalgia, unspecified: Secondary | ICD-10-CM

## 2024-02-19 DIAGNOSIS — R0789 Other chest pain: Secondary | ICD-10-CM | POA: Insufficient documentation

## 2024-02-19 DIAGNOSIS — E860 Dehydration: Secondary | ICD-10-CM

## 2024-02-19 DIAGNOSIS — O0990 Supervision of high risk pregnancy, unspecified, unspecified trimester: Secondary | ICD-10-CM

## 2024-02-19 DIAGNOSIS — Z3A01 Less than 8 weeks gestation of pregnancy: Secondary | ICD-10-CM

## 2024-02-19 DIAGNOSIS — M545 Low back pain, unspecified: Secondary | ICD-10-CM | POA: Diagnosis not present

## 2024-02-19 DIAGNOSIS — O26891 Other specified pregnancy related conditions, first trimester: Secondary | ICD-10-CM | POA: Insufficient documentation

## 2024-02-19 DIAGNOSIS — R079 Chest pain, unspecified: Secondary | ICD-10-CM

## 2024-02-19 DIAGNOSIS — O0991 Supervision of high risk pregnancy, unspecified, first trimester: Secondary | ICD-10-CM

## 2024-02-19 LAB — URINALYSIS, ROUTINE W REFLEX MICROSCOPIC
Bilirubin Urine: NEGATIVE
Glucose, UA: NEGATIVE mg/dL
Hgb urine dipstick: NEGATIVE
Ketones, ur: 5 mg/dL — AB
Leukocytes,Ua: NEGATIVE
Nitrite: NEGATIVE
Protein, ur: NEGATIVE mg/dL
Specific Gravity, Urine: 1.027 (ref 1.005–1.030)
pH: 6 (ref 5.0–8.0)

## 2024-02-19 MED ORDER — CYCLOBENZAPRINE HCL 5 MG PO TABS
5.0000 mg | ORAL_TABLET | Freq: Once | ORAL | Status: AC
  Administered 2024-02-19: 5 mg via ORAL
  Filled 2024-02-19: qty 1

## 2024-02-19 MED ORDER — ACETAMINOPHEN-CAFFEINE 500-65 MG PO TABS
2.0000 | ORAL_TABLET | Freq: Once | ORAL | Status: AC
  Administered 2024-02-19: 2 via ORAL
  Filled 2024-02-19: qty 2

## 2024-02-19 MED ORDER — CYCLOBENZAPRINE HCL 5 MG PO TABS: 5.0000 mg | ORAL_TABLET | Freq: Three times a day (TID) | ORAL | 0 refills | Status: DC | PRN

## 2024-02-19 NOTE — Discharge Instructions (Signed)
 Safe Medications in Pregnancy   Acne: Benzoyl Peroxide Salicylic Acid  Backache/Headache: Tylenol: 2 regular strength every 4 hours OR              2 Extra strength every 6 hours  Colds/Coughs/Allergies: Benadryl (alcohol free) 25 mg every 6 hours as needed Breath right strips Claritin Cepacol throat lozenges Chloraseptic throat spray Cold-Eeze- up to three times per day Cough drops, alcohol free Flonase (by prescription only) Guaifenesin Mucinex Robitussin DM (plain only, alcohol free) Saline nasal spray/drops Sudafed (pseudoephedrine) & Actifed ** use only after [redacted] weeks gestation and if you do not have high blood pressure Tylenol Vicks Vaporub Zinc lozenges Zyrtec   Constipation: Colace Ducolax suppositories Fleet enema Glycerin suppositories Metamucil Milk of magnesia Miralax Senokot Smooth move tea  Diarrhea: Kaopectate Imodium A-D  *NO pepto Bismol  Hemorrhoids: Anusol Anusol HC Preparation H Tucks  Indigestion: Tums Maalox Mylanta Zantac  Pepcid  Insomnia: Benadryl (alcohol free) 25mg  every 6 hours as needed Tylenol PM Unisom, no Gelcaps  Leg Cramps: Tums MagGel  Nausea/Vomiting:  Bonine Dramamine Emetrol Ginger extract Sea bands Meclizine  Nausea medication to take during pregnancy:  Unisom (doxylamine succinate 25 mg tablets) Take one tablet daily at bedtime. If symptoms are not adequately controlled, the dose can be increased to a maximum recommended dose of two tablets daily (1/2 tablet in the morning, 1/2 tablet mid-afternoon and one at bedtime). Vitamin B6 100mg  tablets. Take one tablet twice a day (up to 200 mg per day).  Skin Rashes: Aveeno products Benadryl cream or 25mg  every 6 hours as needed Calamine Lotion 1% cortisone cream  Yeast infection: Gyne-lotrimin 7 Monistat 7   **If taking multiple medications, please check labels to avoid duplicating the same active ingredients **take medication as directed on  the label ** Do not exceed 4000 mg of tylenol in 24 hours **Do not take medications that contain aspirin or ibuprofen

## 2024-02-19 NOTE — MAU Provider Note (Addendum)
 History     CSN: [id number]  Arrival date and time: 02/19/24 1842   Event Date/Time   First Provider Initiated Contact with Patient 02/19/24 2104      Chief Complaint  Patient presents with   Headache   Chest Pain   Nicole [name] , a  33 y.o. G2P1001 at [redacted]w[redacted]d presents to MAU with complaints of chest pain, back pain and a headache. Patient states that today she was walking and noted a sharp chest pain that did not subside. She denies eating anything today and reports a 1/2 bottle of water. Patient reports being very thirsty throughout the day but states that she has been trying to lose weight. She also reports left sided low back pain. She states that the pain is more with lying down and reports "feeling like a cramp in her back." Denies attempting to relieve symptoms. She denies urinary complaints. She denies abnormal vaginal discharge, vaginal bleeding or abdominal pain.   Patient was recently seen in [facility] for NOB intake.        Headache  Pertinent negatives include no abdominal pain, back pain, eye pain, fever, nausea, seizures, vomiting or weakness.  Chest Pain  Associated symptoms include headaches. Pertinent negatives include no abdominal pain, back pain, fever, nausea, palpitations, shortness of breath, vomiting or weakness.  Pertinent negatives for past medical history include no seizures.    OB History     Gravida  2   Para  1   Term  1   Preterm  0   AB  0   Living  1      SAB  0   IAB  0   Ectopic  0   Multiple  0   Live Births  0           Past Medical History:  Diagnosis Date   Anxiety    Depression    Hypothyroidism 08/07/2022   Tobacco use disorder 08/07/2022    Past Surgical History:  Procedure Laterality Date   WISDOM TOOTH EXTRACTION Bilateral 2020    Family History  Problem Relation Age of Onset   Hypertension Father    Diabetes Father     Social History   Tobacco Use   Smoking status: Former    Current  packs/day: 0.00    Types: Cigarettes    Quit date: 02/17/2024   Smokeless tobacco: Never  Vaping Use   Vaping status: Former   Quit date: 01/13/2024  Substance Use Topics   Alcohol use: Not Currently    Comment: weekly   Drug use: Not Currently    Types: Marijuana, Other-see comments    Comment: Xanax    Allergies:  Allergies  Allergen Reactions   Apple Juice Anaphylaxis   Other Anaphylaxis and Other (See Comments)    SALMON   Watermelon Flavoring Agent (Non-Screening) Anaphylaxis    Facility-Administered Medications Prior to Admission  Medication Dose Route Frequency Provider Last Rate Last Admin   ARIPiprazole  ER (ABILIFY  MAINTENA) 400 MG prefilled syringe 400 mg  400 mg Intramuscular Q28 days    400 mg at 01/07/24 1351   Medications Prior to Admission  Medication Sig Dispense Refill Last Dose/Taking   ARIPiprazole  ER (ABILIFY  MAINTENA) 400 MG PRSY prefilled syringe Inject 400 mg into the muscle every 28 (twenty-eight) days. 1 each 11 Past Month   buPROPion  (WELLBUTRIN  XL) 150 MG 24 hr tablet Take 1 tablet (150 mg total) by mouth every morning. 30 tablet 3 02/18/2024   lamoTRIgine (LAMICTAL)  25 MG tablet Take 25 mg by mouth daily.   02/18/2024   pantoprazole  (PROTONIX ) 40 MG tablet Take 40 mg by mouth daily.   02/18/2024   sertraline  (ZOLOFT ) 100 MG tablet Take 100 mg by mouth at bedtime.   02/18/2024   traZODone  (DESYREL ) 100 MG tablet Take 100 mg by mouth at bedtime.   02/18/2024   traZODone  (DESYREL ) 50 MG tablet Take 1 tablet (50 mg total) by mouth at bedtime. 30 tablet 3 02/18/2024   ABILIFY  MAINTENA 400 MG SRER injection Inject 400 mg into the muscle every 30 (thirty) days.      folic acid (FOLVITE) 1 MG tablet Take 1 tablet (1 mg total) by mouth daily. (Patient not taking: Reported on 02/19/2024) 30 tablet 10 Not Taking   hydrOXYzine  (ATARAX ) 10 MG tablet Take 1 tablet (10 mg total) by mouth 3 (three) times daily as needed. (Patient not taking: Reported on 01/25/2024) 90 tablet 3     methocarbamol  (ROBAXIN ) 500 MG tablet Take 500 mg by mouth 3 (three) times daily as needed. (Patient not taking: Reported on 02/18/2024)      norethindrone  (MICRONOR ) 0.35 MG tablet Take 1 tablet (0.35 mg total) by mouth daily. (Patient not taking: Reported on 01/25/2024) 28 tablet 11    valACYclovir  (VALTREX ) 500 MG tablet Take 1 tablet (500 mg total) by mouth daily. (Patient not taking: Reported on 02/19/2024) 30 tablet 8 Not Taking    Review of Systems  Constitutional:  Negative for chills, fatigue and fever.  Eyes:  Negative for pain and visual disturbance.  Respiratory:  Negative for apnea, shortness of breath and wheezing.   Cardiovascular:  Positive for chest pain. Negative for palpitations.  Gastrointestinal:  Negative for abdominal pain, constipation, diarrhea, nausea and vomiting.  Genitourinary:  Negative for difficulty urinating, dysuria, vaginal bleeding, vaginal discharge and vaginal pain.  Musculoskeletal:  Negative for back pain.  Neurological:  Positive for headaches. Negative for seizures and weakness.  Psychiatric/Behavioral:  Negative for suicidal ideas.    Physical Exam   Blood pressure 113/84, pulse 83, temperature 98.3 F (36.8 C), resp. rate 18, height 5\' 1"  (1.549 m), weight 86.6 kg, last menstrual period 01/13/2024, SpO2 100%.  Physical Exam Vitals and nursing note reviewed.  Constitutional:      General: She is not in acute distress.    Appearance: Normal appearance.  HENT:     Head: Normocephalic.  Pulmonary:     Effort: Pulmonary effort is normal.  Musculoskeletal:     Cervical back: Normal range of motion.  Skin:    General: Skin is warm and dry.  Neurological:     Mental Status: She is alert and oriented to person, place, and time.     GCS: GCS eye subscore is 4. GCS verbal subscore is 5. GCS motor subscore is 6.  Psychiatric:        Mood and Affect: Mood normal.     MAU Course  Procedures Orders Placed This Encounter  Procedures   Urinalysis,  Routine w reflex microscopic -Urine, Clean Catch   ED EKG   Discharge patient Discharge disposition: 01-Home or Self Care; Discharge patient date: 02/19/2024   Results for orders placed or performed during the hospital encounter of 02/19/24 (from the past 24 hours)  Urinalysis, Routine w reflex microscopic -Urine, Clean Catch     Status: Abnormal   Collection Time: 02/19/24  7:50 PM  Result Value Ref Range   Color, Urine YELLOW YELLOW   APPearance HAZY (A) CLEAR  Specific Gravity, Urine 1.027 1.005 - 1.030   pH 6.0 5.0 - 8.0   Glucose, UA NEGATIVE NEGATIVE mg/dL   Hgb urine dipstick NEGATIVE NEGATIVE   Bilirubin Urine NEGATIVE NEGATIVE   Ketones, ur 5 (A) NEGATIVE mg/dL   Protein, ur NEGATIVE NEGATIVE mg/dL   Nitrite NEGATIVE NEGATIVE   Leukocytes,Ua NEGATIVE NEGATIVE    MDM - EKG normal sinus rhythm  - UA + Ketones and hazy in appearance.  - PO Excedrin and flexeril  ordered  - Pain improved.  - Transfer of care to [facility] at 945pm   Nicole [name]) Marlys Singh, MSN, CNM  [facility]  02/19/2024 9:44 PM     Assessment and Plan  Pregnancy at [redacted]w[redacted]d by LMP Flank pain Headache Dehydration Musculoskeletal chest pain  Discharge home Rx Flexeril  for pain Has US  scheduled on 03/03/24 Encouraged to return if she develops worsening of symptoms, increase in pain, fever, or other concerning symptoms.   Harlee Lichtenstein, CNM

## 2024-02-19 NOTE — MAU Note (Signed)
 MAU Triage Note  .Nicole [name] is a 33 y.o. at [redacted]w[redacted]d here in MAU reporting L flank pain, headache, and chest pain. Symptoms started today. Pain in flank is crampy and constant but is worse at night. Chest pain is "stabbing" and worse with movement. Has "spasms in vajj" at night. Denies VB or d/c.   LMP: n/a Onset of complaint: today Pain score: h/a is 6, back 9, chest 8 Vitals:   02/19/24 1935 02/19/24 1937  BP:  113/84  Pulse: 83   Resp: 18   Temp: 98.3 F (36.8 C)   SpO2: 100%      FHT: n/a  Lab orders placed from triage: uri

## 2024-02-19 NOTE — Progress Notes (Signed)
 Written and verbal d/c instructions given and understanding voiced.

## 2024-03-03 ENCOUNTER — Other Ambulatory Visit

## 2024-03-03 ENCOUNTER — Ambulatory Visit: Admitting: *Deleted

## 2024-03-03 ENCOUNTER — Other Ambulatory Visit (INDEPENDENT_AMBULATORY_CARE_PROVIDER_SITE_OTHER): Payer: Self-pay

## 2024-03-03 VITALS — BP 110/77 | HR 80 | Wt 200.5 lb

## 2024-03-03 DIAGNOSIS — O0991 Supervision of high risk pregnancy, unspecified, first trimester: Secondary | ICD-10-CM | POA: Diagnosis not present

## 2024-03-03 DIAGNOSIS — Z3A01 Less than 8 weeks gestation of pregnancy: Secondary | ICD-10-CM

## 2024-03-03 DIAGNOSIS — O0990 Supervision of high risk pregnancy, unspecified, unspecified trimester: Secondary | ICD-10-CM

## 2024-03-03 NOTE — Progress Notes (Signed)
 Nicole [name] presents for repeat US  to verify viability and dating. Previous scan did not show GS, FP, or YS but showed thickened endometrial stripe. Single live IUP seen today at [redacted]w[redacted]d by CRL and LMP. Congratulations offered. New OB appt scheduled at check out. OB labs deferred to New OB appt.

## 2024-03-10 ENCOUNTER — Ambulatory Visit (HOSPITAL_COMMUNITY)

## 2024-03-12 ENCOUNTER — Other Ambulatory Visit (HOSPITAL_COMMUNITY): Payer: Self-pay | Admitting: Psychiatry

## 2024-03-12 DIAGNOSIS — F209 Schizophrenia, unspecified: Secondary | ICD-10-CM

## 2024-03-16 ENCOUNTER — Ambulatory Visit (INDEPENDENT_AMBULATORY_CARE_PROVIDER_SITE_OTHER): Admitting: Psychiatry

## 2024-03-16 ENCOUNTER — Ambulatory Visit (HOSPITAL_COMMUNITY)

## 2024-03-16 ENCOUNTER — Encounter (HOSPITAL_COMMUNITY): Payer: Self-pay

## 2024-03-16 VITALS — BP 112/84 | HR 88 | Temp 98.4°F | Ht 61.0 in | Wt 191.0 lb

## 2024-03-16 DIAGNOSIS — F250 Schizoaffective disorder, bipolar type: Secondary | ICD-10-CM

## 2024-03-16 DIAGNOSIS — G479 Sleep disorder, unspecified: Secondary | ICD-10-CM | POA: Diagnosis not present

## 2024-03-16 DIAGNOSIS — F209 Schizophrenia, unspecified: Secondary | ICD-10-CM

## 2024-03-16 DIAGNOSIS — F331 Major depressive disorder, recurrent, moderate: Secondary | ICD-10-CM | POA: Diagnosis not present

## 2024-03-16 DIAGNOSIS — F411 Generalized anxiety disorder: Secondary | ICD-10-CM

## 2024-03-16 MED ORDER — ARIPIPRAZOLE ER 400 MG IM PRSY
400.0000 mg | PREFILLED_SYRINGE | Freq: Once | INTRAMUSCULAR | Status: AC
  Administered 2024-03-16: 400 mg via INTRAMUSCULAR

## 2024-03-16 MED ORDER — LAMOTRIGINE 25 MG PO TABS: 50.0000 mg | ORAL_TABLET | Freq: Every day | ORAL | 3 refills | Status: DC

## 2024-03-16 MED ORDER — ABILIFY MAINTENA 400 MG IM PRSY: 400.0000 mg | PREFILLED_SYRINGE | INTRAMUSCULAR | 11 refills | Status: DC

## 2024-03-16 MED ORDER — TRAZODONE HCL 100 MG PO TABS: 100.0000 mg | ORAL_TABLET | Freq: Every day | ORAL | 3 refills | Status: DC

## 2024-03-16 MED ORDER — SERTRALINE HCL 100 MG PO TABS: 100.0000 mg | ORAL_TABLET | Freq: Every day | ORAL | 3 refills | Status: DC

## 2024-03-16 NOTE — Progress Notes (Cosign Needed Addendum)
 Pt presents today for the injection of Abilify  maintenna 400mg . Patient state states she is doing good and is expecting a little bundle of joy. Pt is [redacted] weeks pregnant and is very excited about baby. Pt tolerated injection in R-deltoid with no problems.   JNL, CMA

## 2024-03-16 NOTE — Progress Notes (Signed)
 BH MD/PA/NP OP Progress Note    03/16/2024 11:13 AM Nicole [name]  MRN:  [id number]  Chief Complaint: "I am pregnant and concerned about being on medications"  HPI: 33 year old female seen today for follow-up psychiatric evaluation.  She has a psychiatric history of schizophrenia, depression, anxiety, insomnia, tobacco use, and borderline intellectual functioning. Patient was seen at [facility] on 01/24/2024-01/26/2024. Per chart review patient was admitted due to exacerbation of psychotic symptoms.  She was transferred to [facility] after leaving [facility]  on 01/26/2023-02/04/2024. Patients medications were adjusted and she is currently managed on Abilify  maintainer 400 mg every 28 days,trazodone  100 mg nightly as needed, Seroquel  400 mg twice daily, Zoloft  100 mg daily, and Lamictal 25 mg daily.  She inform her that her medications are somewhat effective in managing her psychiatric condition.  Today she is well-groomed, pleasant, cooperative, and engaged in conversation.  Patient informed Clinical research associate that she is pregnant and is concerned about her medication regimen. She last received her last injection by her boyfriend on 02/10/24 which was monitored by EMS. She continues to take Seroquel  400 mg twice daily. Provider discussed the risk and benefits with being on two antipsychotic for her health as well as her unborn child's health. Provider suggested reducing Seroquel  or discontinuing it. Patient notes that she would like to discontinue it. She notes that she Seroquel  helps her sleep but notes that Abilify  helps with compliance. At times she notes that she continues to have some irritability, distract ability, and racing thoughts.  Since her last visit she notes that she has been anxious about her mental health, the safety of her unborn child, and her 58 year old daughter.  Today provider conducted a GAD 7 and patient scored a 14, at her last visit she scored a 21. Provider also conducted a PHQ 9 and patient  scored a 3, at her last visit she scored a 14.  She reports that her appetite is reduced and notes that she has lost 9 pounds. She does note that she has AH of a "women telling me what to do".  Today she denies SI/HI/VH or paranoia.  Patient denies tobacco or illegal drug use.    Today provider conducted an AIMS assessment and patient scored a 0.  Today patient agreeable to discontinuing Seroquel  400 mg BID. She will increase Lamictal 25 mg to 50 mg to help manage mood. She will continue all other medications as prescribed. Provider Faxed over release of information to [facility] for access to patients medical records. No other concerns noted at this time.  Visit Diagnosis:    ICD-10-CM   1. Anxiety state  F41.1 sertraline  (ZOLOFT ) 100 MG tablet    traZODone  (DESYREL ) 100 MG tablet    2. Schizoaffective disorder, bipolar type (HCC)  F25.0 lamoTRIgine (LAMICTAL) 25 MG tablet    ARIPiprazole  ER (ABILIFY  MAINTENA) 400 MG PRSY prefilled syringe    3. Sleep disturbance  G47.9 traZODone  (DESYREL ) 100 MG tablet         Past Psychiatric History: Schizophrenia (unspecified type), Major depressive disorder, Patient reports that she has been hospitalized due to mental health multiple times.  Patient states that she has done stints at [facility] and [facility]  Past Medical History:  Past Medical History:  Diagnosis Date   Anxiety    Depression    Hypothyroidism 08/07/2022   Tobacco use disorder 08/07/2022    Past Surgical History:  Procedure Laterality Date   WISDOM TOOTH EXTRACTION Bilateral 2020  Family Psychiatric History: mother attempted suicide, Patient reports that her brother used to abuse marijuana and alcohol. Patient states that her mother used to abuse marijuana and alcohol.   Family History:  Family History  Problem Relation Age of Onset   Hypertension Father    Diabetes Father     Social History:  Social History   Socioeconomic History    Marital status: Single    Spouse name: Not on file   Number of children: Not on file   Years of education: Not on file   Highest education level: Not on file  Occupational History   Not on file  Tobacco Use   Smoking status: Former    Current packs/day: 0.00    Types: Cigarettes    Quit date: 02/17/2024    Years since quitting: 0.0   Smokeless tobacco: Never  Vaping Use   Vaping status: Former   Quit date: 01/13/2024  Substance and Sexual Activity   Alcohol use: Not Currently    Comment: weekly   Drug use: Not Currently    Types: Marijuana, Other-see comments    Comment: Xanax   Sexual activity: Yes    Partners: Female  Other Topics Concern   Not on file  Social History Narrative   Not on file   Social Drivers of Health   Financial Resource Strain: Low Risk  (12/13/2022)   Overall Financial Resource Strain (CARDIA)    Difficulty of Paying Living Expenses: Not hard at all  Food Insecurity: No Food Insecurity (01/24/2024)   Hunger Vital Sign    Worried About Running Out of Food in the Last Year: Never true    Ran Out of Food in the Last Year: Never true  Transportation Needs: Unmet Transportation Needs (01/24/2024)   PRAPARE - Transportation    Lack of Transportation (Medical): Yes    Lack of Transportation (Non-Medical): Yes  Physical Activity: Inactive (12/13/2022)   Exercise Vital Sign    Days of Exercise per Week: 0 days    Minutes of Exercise per Session: 0 min  Stress: Stress Concern Present (12/13/2022)   [employer] of Occupational Health - Occupational Stress Questionnaire    Feeling of Stress : Very much  Social Connections: Socially Integrated (12/13/2022)   Social Connection and Isolation Panel [NHANES]    Frequency of Communication with Friends and Family: More than three times a week    Frequency of Social Gatherings with Friends and Family: Three times a week    Attends Religious Services: More than 4 times per year    Active Member of Clubs or  Organizations: Yes    Attends Banker Meetings: More than 4 times per year    Marital Status: Living with partner    Allergies:  Allergies  Allergen Reactions   Apple Juice Anaphylaxis   Other Anaphylaxis and Other (See Comments)    SALMON   Watermelon Flavoring Agent (Non-Screening) Anaphylaxis    Metabolic Disorder Labs: Lab Results  Component Value Date   HGBA1C 5.1 01/24/2024   MPG 99.67 01/24/2024   MPG 117 10/15/2022   Lab Results  Component Value Date   PROLACTIN 2.1 (L) 01/24/2024   PROLACTIN 0.6 (L) 01/14/2024   Lab Results  Component Value Date   CHOL 165 01/24/2024   TRIG 49 01/24/2024   HDL 53 01/24/2024   CHOLHDL 3.1 01/24/2024   VLDL 10 01/24/2024   LDLCALC 102 (H) 01/24/2024   LDLCALC 112 (H) 07/25/2022   Lab Results  Component  Value Date   TSH 2.550 01/24/2024   TSH 0.793 09/05/2022    Therapeutic Level Labs: No results found for: "LITHIUM" No results found for: "VALPROATE" No results found for: "CBMZ"  Current Medications: Current Outpatient Medications  Medication Sig Dispense Refill   ARIPiprazole  ER (ABILIFY  MAINTENA) 400 MG PRSY prefilled syringe Inject 400 mg into the muscle every 28 (twenty-eight) days. 1 each 11   cyclobenzaprine  (FLEXERIL ) 5 MG tablet Take 1 tablet (5 mg total) by mouth 3 (three) times daily as needed for muscle spasms. 20 tablet 0   folic acid  (FOLVITE ) 1 MG tablet Take 1 tablet (1 mg total) by mouth daily. 30 tablet 10   lamoTRIgine (LAMICTAL) 25 MG tablet Take 2 tablets (50 mg total) by mouth daily. 60 tablet 3   methocarbamol  (ROBAXIN ) 500 MG tablet Take 500 mg by mouth 3 (three) times daily as needed. (Patient not taking: Reported on 02/18/2024)     norethindrone  (MICRONOR ) 0.35 MG tablet Take 1 tablet (0.35 mg total) by mouth daily. (Patient not taking: Reported on 01/25/2024) 28 tablet 11   pantoprazole  (PROTONIX ) 40 MG tablet Take 40 mg by mouth daily.     sertraline  (ZOLOFT ) 100 MG tablet Take 1  tablet (100 mg total) by mouth at bedtime. 30 tablet 3   traZODone  (DESYREL ) 100 MG tablet Take 1 tablet (100 mg total) by mouth at bedtime. 30 tablet 3   valACYclovir  (VALTREX ) 500 MG tablet Take 1 tablet (500 mg total) by mouth daily. 30 tablet 8   Current Facility-Administered Medications  Medication Dose Route Frequency Provider Last Rate Last Admin   ARIPiprazole  ER (ABILIFY  MAINTENA) 400 MG prefilled syringe 400 mg  400 mg Intramuscular Q28 days    400 mg at 03/16/24 1610     Musculoskeletal: Strength & Muscle Tone: within normal limits Gait & Station: normal Patient leans: N/A  Psychiatric Specialty Exam: Review of Systems  Last menstrual period 01/13/2024.There is no height or weight on file to calculate BMI.  General Appearance: Well Groomed  Eye Contact:  Good  Speech:  Clear and Coherent and Slow  Volume:  Normal  Mood:  Anxious, improving  Affect:  Appropriate and Congruent  Thought Process:  Coherent, Goal Directed, and Linear  Orientation:  Full (Time, Place, and Person)  Thought Content: Logical and Hallucinations: Auditory   Suicidal Thoughts:  No  Homicidal Thoughts:  No  Memory:  Immediate;   Good Recent;   Good Remote;   Good  Judgement:  Good  Insight:  Good  Psychomotor Activity:  Normal  Concentration:  Concentration: Good and Attention Span: Good  Recall:  Good  Fund of Knowledge: Good  Language: Good  Akathisia:  No  Handed:  Right  AIMS (if indicated):  done, 0  Assets:  Communication Skills Desire for Improvement Housing Leisure Time Physical Health Social Support  ADL's:  Intact  Cognition: WNL  Sleep:  Good   Screenings: AIMS    Flowsheet Row Office Visit from 03/16/2024 in [facility] Office Visit from 01/30/2023 in [facility]  AIMS Total Score 0 2      GAD-7    Flowsheet Row Office Visit from 03/16/2024 in [facility] Clinical Support from  02/18/2024 in [facility] for [facility] [place] Video Visit from 01/21/2024 in [facility] Video Visit from 11/07/2023 in [facility] Counselor from 03/20/2023 in [facility]  Total GAD-7 Score 14 0 17 21 16       PHQ2-9    Flowsheet Row Office Visit from 03/16/2024 in [facility] Clinical Support from 02/18/2024 in [facility] for [facility] [place] ED from 01/24/2024 in [facility] Video Visit from 01/21/2024 in [facility] Video Visit from 11/07/2023 in [facility]  PHQ-2 Total Score 0 0 2 2 6   PHQ-9 Total Score 3 0 9 14 --      Flowsheet Row Office Visit from 03/16/2024 in [facility] from 02/17/2024 in [facility] ED from 01/24/2024 in [facility]  C-SSRS RISK CATEGORY Error: Q3, 4, or 5 should not be populated when Q2 is No No Risk No Risk        Assessment and Plan: Patient reports that her anxiety and depression has improved. She continues to experience AH. Patient concerned about being pregnant. Today patient agreeable to discontinuing Seroquel  400 mg BID. She will increase Lamictal 25 mg to 50 mg to help manage mood. She will continue all other medications as prescribed. Provider Faxed over release of information to [facility] for access to patients medical records.   1. Schizoaffective disorder, bipolar type (HCC)  Continue- lamoTRIgine (LAMICTAL) 25 MG tablet; Take 2 tablets (50 mg total) by mouth daily.  Dispense: 60 tablet; Refill: 3 Continue- ARIPiprazole  ER (ABILIFY  MAINTENA) 400 MG PRSY prefilled syringe; Inject 400 mg into the muscle every 28 (twenty-eight) days.  Dispense: 1 each; Refill: 11  2. Anxiety state (Primary)  Continue- sertraline   (ZOLOFT ) 100 MG tablet; Take 1 tablet (100 mg total) by mouth at bedtime.  Dispense: 30 tablet; Refill: 3 Continue- traZODone  (DESYREL ) 100 MG tablet; Take 1 tablet (100 mg total) by mouth at bedtime.  Dispense: 30 tablet; Refill: 3  3. Sleep disturbance  Continue- traZODone  (DESYREL ) 100 MG tablet; Take 1 tablet (100 mg total) by mouth at bedtime.  Dispense: 30 tablet; Refill: 3     Collaboration of Care: Collaboration of Care: Other provider involved in patient's care AEB counselor, PCP, shock clinic staff  Patient/Guardian was advised Release of Information must be obtained prior to any record release in order to collaborate their care with an outside provider. Patient/Guardian was advised if they have not already done so to contact the registration department to sign all necessary forms in order for us  to release information regarding their care.   Consent: Patient/Guardian gives verbal consent for treatment and assignment of benefits for services provided during this visit. Patient/Guardian expressed understanding and agreed to proceed.   Follow-up in 2-1/2 months Follow-up with shot clinic in 1 month Nicole Bering, NP 03/16/2024, 11:13 AM

## 2024-03-19 ENCOUNTER — Other Ambulatory Visit: Payer: Self-pay

## 2024-03-19 ENCOUNTER — Inpatient Hospital Stay (HOSPITAL_COMMUNITY)
Admission: AD | Admit: 2024-03-19 | Discharge: 2024-03-19 | Disposition: A | Discharge status: Home / Self Care | Attending: Obstetrics and Gynecology | Admitting: Obstetrics and Gynecology

## 2024-03-19 DIAGNOSIS — Z79899 Other long term (current) drug therapy: Secondary | ICD-10-CM | POA: Insufficient documentation

## 2024-03-19 DIAGNOSIS — N760 Acute vaginitis: Secondary | ICD-10-CM

## 2024-03-19 DIAGNOSIS — Z87891 Personal history of nicotine dependence: Secondary | ICD-10-CM | POA: Diagnosis not present

## 2024-03-19 DIAGNOSIS — R1032 Left lower quadrant pain: Secondary | ICD-10-CM | POA: Diagnosis present

## 2024-03-19 DIAGNOSIS — B9689 Other specified bacterial agents as the cause of diseases classified elsewhere: Secondary | ICD-10-CM | POA: Diagnosis not present

## 2024-03-19 DIAGNOSIS — O0991 Supervision of high risk pregnancy, unspecified, first trimester: Secondary | ICD-10-CM | POA: Diagnosis not present

## 2024-03-19 DIAGNOSIS — O23591 Infection of other part of genital tract in pregnancy, first trimester: Secondary | ICD-10-CM | POA: Insufficient documentation

## 2024-03-19 DIAGNOSIS — Z113 Encounter for screening for infections with a predominantly sexual mode of transmission: Secondary | ICD-10-CM | POA: Diagnosis present

## 2024-03-19 DIAGNOSIS — Z3A09 9 weeks gestation of pregnancy: Secondary | ICD-10-CM | POA: Insufficient documentation

## 2024-03-19 DIAGNOSIS — O0990 Supervision of high risk pregnancy, unspecified, unspecified trimester: Secondary | ICD-10-CM

## 2024-03-19 LAB — URINALYSIS, ROUTINE W REFLEX MICROSCOPIC
Bilirubin Urine: NEGATIVE
Glucose, UA: NEGATIVE mg/dL
Hgb urine dipstick: NEGATIVE
Ketones, ur: NEGATIVE mg/dL
Nitrite: NEGATIVE
Protein, ur: NEGATIVE mg/dL
Specific Gravity, Urine: 1.012 (ref 1.005–1.030)
pH: 7 (ref 5.0–8.0)

## 2024-03-19 LAB — WET PREP, GENITAL
Sperm: NONE SEEN
Trich, Wet Prep: NONE SEEN
WBC, Wet Prep HPF POC: >=10 — AB (ref <10)
Yeast Wet Prep HPF POC: NONE SEEN

## 2024-03-19 MED ORDER — METRONIDAZOLE 500 MG PO TABS: 500.0000 mg | ORAL_TABLET | Freq: Two times a day (BID) | ORAL | 0 refills | Status: AC

## 2024-03-19 NOTE — MAU Provider Note (Signed)
 Chief Complaint: Abdominal Pain  SUBJECTIVE HPI: Nicole [name] is a 32 y.o. G2P1001 at [redacted]w[redacted]d by LMP who presents to maternity admissions reporting LLQ pain that began two days ago. Sharp intermittent 8/10 at rest, but constant with ambulation.  She denies vaginal bleeding, vaginal itching/burning, urinary symptoms, h/a, dizziness, n/v, or fever/chills.    HPI  Past Medical History:  Diagnosis Date   Anxiety    Depression    Hypothyroidism 08/07/2022   Tobacco use disorder 08/07/2022   Past Surgical History:  Procedure Laterality Date   WISDOM TOOTH EXTRACTION Bilateral 2020   Social History   Socioeconomic History   Marital status: Single    Spouse name: Not on file   Number of children: Not on file   Years of education: Not on file   Highest education level: Not on file  Occupational History   Not on file  Tobacco Use   Smoking status: Former    Current packs/day: 0.00    Types: Cigarettes    Quit date: 02/17/2024    Years since quitting: 0.0   Smokeless tobacco: Never  Vaping Use   Vaping status: Former   Quit date: 01/13/2024  Substance and Sexual Activity   Alcohol use: Not Currently    Comment: weekly   Drug use: Not Currently    Types: Marijuana, Other-see comments    Comment: Xanax   Sexual activity: Yes    Partners: Female  Other Topics Concern   Not on file  Social History Narrative   Not on file   Social Drivers of Health   Financial Resource Strain: Low Risk  (12/13/2022)   Overall Financial Resource Strain (CARDIA)    Difficulty of Paying Living Expenses: Not hard at all  Food Insecurity: No Food Insecurity (01/24/2024)   Hunger Vital Sign    Worried About Running Out of Food in the Last Year: Never true    Ran Out of Food in the Last Year: Never true  Transportation Needs: Unmet Transportation Needs (01/24/2024)   PRAPARE - Transportation    Lack of Transportation (Medical): Yes    Lack of Transportation (Non-Medical): Yes  Physical Activity:  Inactive (12/13/2022)   Exercise Vital Sign    Days of Exercise per Week: 0 days    Minutes of Exercise per Session: 0 min  Stress: Stress Concern Present (12/13/2022)   [employer] of Occupational Health - Occupational Stress Questionnaire    Feeling of Stress : Very much  Social Connections: Socially Integrated (12/13/2022)   Social Connection and Isolation Panel [NHANES]    Frequency of Communication with Friends and Family: More than three times a week    Frequency of Social Gatherings with Friends and Family: Three times a week    Attends Religious Services: More than 4 times per year    Active Member of Clubs or Organizations: Yes    Attends Banker Meetings: More than 4 times per year    Marital Status: Living with partner  Intimate Partner Violence: Not At Risk (01/24/2024)   Humiliation, Afraid, Rape, and Kick questionnaire    Fear of Current or Ex-Partner: No    Emotionally Abused: No    Physically Abused: No    Sexually Abused: No   No current facility-administered medications on file prior to encounter.   Current Outpatient Medications on File Prior to Encounter  Medication Sig Dispense Refill   ARIPiprazole  ER (ABILIFY  MAINTENA) 400 MG PRSY prefilled syringe Inject 400 mg into the muscle every  28 (twenty-eight) days. 1 each 11   cyclobenzaprine  (FLEXERIL ) 5 MG tablet Take 1 tablet (5 mg total) by mouth 3 (three) times daily as needed for muscle spasms. 20 tablet 0   folic acid  (FOLVITE ) 1 MG tablet Take 1 tablet (1 mg total) by mouth daily. 30 tablet 10   lamoTRIgine (LAMICTAL) 25 MG tablet Take 2 tablets (50 mg total) by mouth daily. 60 tablet 3   methocarbamol  (ROBAXIN ) 500 MG tablet Take 500 mg by mouth 3 (three) times daily as needed. (Patient not taking: Reported on 02/18/2024)     norethindrone  (MICRONOR ) 0.35 MG tablet Take 1 tablet (0.35 mg total) by mouth daily. (Patient not taking: Reported on 01/25/2024) 28 tablet 11   pantoprazole  (PROTONIX ) 40 MG  tablet Take 40 mg by mouth daily.     sertraline  (ZOLOFT ) 100 MG tablet Take 1 tablet (100 mg total) by mouth at bedtime. 30 tablet 3   traZODone  (DESYREL ) 100 MG tablet Take 1 tablet (100 mg total) by mouth at bedtime. 30 tablet 3   valACYclovir  (VALTREX ) 500 MG tablet Take 1 tablet (500 mg total) by mouth daily. 30 tablet 8   Allergies  Allergen Reactions   Apple Juice Anaphylaxis   Other Anaphylaxis and Other (See Comments)    SALMON   Watermelon Flavoring Agent (Non-Screening) Anaphylaxis    ROS:  Pertinent positives/negatives listed above.  I have reviewed patient's Past Medical Hx, Surgical Hx, Family Hx, Social Hx, medications and allergies.   Physical Exam  Patient Vitals for the past 24 hrs:  BP Temp Pulse Resp SpO2 Height Weight  03/19/24 1127 107/77 97.9 F (36.6 C) 88 19 100 % -- --  03/19/24 1123 -- -- -- -- -- 5\' 1"  (1.549 m) 87.4 kg   Constitutional: Well-developed, well-nourished female in no acute distress.  Cardiovascular: normal rate Respiratory: normal effort GI: Abd soft, non-tender. Pos BS x 4 MS: Extremities nontender, no edema, normal ROM Neurologic: Alert and oriented x 4.  GU: Neg CVAT.  LAB RESULTS Results for orders placed or performed during the hospital encounter of 03/19/24 (from the past 24 hours)  Wet prep, genital     Status: Abnormal   Collection Time: 03/19/24 12:19 PM   Specimen: PATH Cytology Cervicovaginal Ancillary Only  Result Value Ref Range   Yeast Wet Prep HPF POC NONE SEEN NONE SEEN   Trich, Wet Prep NONE SEEN NONE SEEN   Clue Cells Wet Prep HPF POC PRESENT (A) NONE SEEN   WBC, Wet Prep HPF POC >=10 (A) <10   Sperm NONE SEEN       IMAGING US  OB Limited Result Date: 03/03/2024 ----------------------------------------------------------------------  OBSTETRICS REPORT                       (Signed Final 03/03/2024 02:28 pm) ---------------------------------------------------------------------- Patient Info  ID #:        [id number]                          D.O.B.:  Sep 14, 1991 (32 yrs)(F)  Name:       Nicole [name]                  Visit Date: 03/03/2024 01:55 pm ---------------------------------------------------------------------- Performed By  Attending:        Fenton House          Ref. Address:     [address]  [place]                                                             [address]                                                             [place], [place]                                                             [address]  Performed By:     Hortensia Ma RN        Location:         Center for                                                             [facility]  Referred By:      [facility] ---------------------------------------------------------------------- Orders  #  Description                           Code        Ordered By  1  US  OB LIMITED                         19147.8     Fenton House ----------------------------------------------------------------------  #  Order #                     Accession #                Episode #  1  [id number]                   [id number]                 [id number] ---------------------------------------------------------------------- Indications  Less than [redacted] weeks gestation of pregnancy       Z3A.01 ---------------------------------------------------------------------- Fetal Evaluation  Num Of Fetuses:  1  Preg. Location:         Intrauterine  Gest. Sac:              Intrauterine  Yolk Sac:               Visualized  Fetal Pole:             Visualized  Fetal Heart Rate(bpm):  147  Cardiac Activity:       Observed ---------------------------------------------------------------------- Biometry  CRL:        11  mm     G. Age:  7w 1d                   EDD:   10/19/24  ---------------------------------------------------------------------- OB History  Gravidity:    2         Term:   1        Prem:   0        SAB:   0  TOP:          0       Ectopic:  0        Living: 1 ---------------------------------------------------------------------- Gestational Age  LMP:           7w 1d         Date:  01/13/24                   EDD:   10/19/24  Best:          7w 1d      Det. By:  LMP  (01/13/24)          EDD:   10/19/24 ---------------------------------------------------------------------- Comments  Single live IUP at [redacted]w[redacted]d by CRL. EDD of 10/19/24 to be  assigned based on exact LMP. ---------------------------------------------------------------------- Recommendations  Single viable intrauterine pregnancy at 102w1d  EDD 10/19/24 based on LMP consistent with today's exam ----------------------------------------------------------------------             Fenton House, MD Electronically Signed Final Report   03/03/2024 02:28 pm ----------------------------------------------------------------------    MAU Management/MDM: Orders Placed This Encounter  Procedures   Wet prep, genital   Urinalysis, Routine w reflex microscopic -Urine, Clean Catch    Meds ordered this encounter  Medications   metroNIDAZOLE  (FLAGYL ) 500 MG tablet    Sig: Take 1 tablet (500 mg total) by mouth 2 (two) times daily for 7 days.    Dispense:  14 tablet    Refill:  0   ASSESSMENT 1. Supervision of high risk pregnancy, antepartum   2. [redacted] weeks gestation of pregnancy   3. Bacterial vaginosis   Suspect pain secondary to vaginal infection.  Treat with Flagyl    PLAN Discharge home with strict return precautions. Continue prenatal care as scheduled.  Allergies as of 03/19/2024       Reactions   Apple Juice Anaphylaxis   Other Anaphylaxis, Other (See Comments)   SALMON   Watermelon Flavoring Agent (non-screening) Anaphylaxis        Medication List     TAKE these medications    Abilify  Maintena 400 MG Prsy  prefilled syringe Generic drug: ARIPiprazole  ER Inject 400 mg into the muscle every 28 (twenty-eight) days.   cyclobenzaprine  5 MG tablet Commonly known as: FLEXERIL  Take 1 tablet (5 mg total) by mouth 3 (three) times daily as needed for muscle spasms.   folic acid  1 MG tablet Commonly known as: FOLVITE  Take 1 tablet (1 mg total) by mouth daily.   lamoTRIgine  25 MG tablet Commonly known as: LAMICTAL Take 2 tablets (50 mg total) by mouth daily.   methocarbamol  500 MG tablet Commonly known as: ROBAXIN  Take 500 mg by mouth 3 (three) times daily as needed.   metroNIDAZOLE  500 MG tablet Commonly known as: FLAGYL  Take 1 tablet (500 mg total) by mouth 2 (two) times daily for 7 days.   norethindrone  0.35 MG tablet Commonly known as: MICRONOR  Take 1 tablet (0.35 mg total) by mouth daily.   pantoprazole  40 MG tablet Commonly known as: PROTONIX  Take 40 mg by mouth daily.   sertraline  100 MG tablet Commonly known as: ZOLOFT  Take 1 tablet (100 mg total) by mouth at bedtime.   traZODone  100 MG tablet Commonly known as: DESYREL  Take 1 tablet (100 mg total) by mouth at bedtime.   valACYclovir  500 MG tablet Commonly known as: VALTREX  Take 1 tablet (500 mg total) by mouth daily.        Darrow End, MD FMOB Fellow, Faculty practice [facility], [facility]  03/19/2024  2:11 PM

## 2024-03-19 NOTE — MAU Note (Signed)
 Nicole [name] is a 33 y.o. at [redacted]w[redacted]d here in MAU reporting: she's having left lower abdominal pain that began two days ago.  Reports pain is intermittent and sharp, when ambulating it's constant.  Denies VB. Denies taking meds to treat discomfort.  LMP: 01/13/2024 Onset of complaint: 2 days  Pain score: 8 Vitals:   03/19/24 1127  BP: 107/77  Pulse: 88  Resp: 19  Temp: 97.9 F (36.6 C)  SpO2: 100%     FHT: NA  Lab orders placed from triage: UA

## 2024-03-22 LAB — GC/CHLAMYDIA PROBE AMP (~~LOC~~) NOT AT ARMC
Chlamydia: NEGATIVE
Comment: NEGATIVE
Comment: NORMAL
Neisseria Gonorrhea: POSITIVE — AB

## 2024-03-23 ENCOUNTER — Telehealth (HOSPITAL_COMMUNITY): Admitting: Psychiatry

## 2024-03-24 ENCOUNTER — Ambulatory Visit (HOSPITAL_COMMUNITY): Payer: Self-pay

## 2024-03-24 DIAGNOSIS — O98211 Gonorrhea complicating pregnancy, first trimester: Secondary | ICD-10-CM

## 2024-03-24 MED ORDER — CEFIXIME 400 MG PO CAPS: 800.0000 mg | ORAL_CAPSULE | Freq: Every day | ORAL | 0 refills | Status: DC

## 2024-04-01 ENCOUNTER — Inpatient Hospital Stay (HOSPITAL_COMMUNITY)
Admission: AD | Admit: 2024-04-01 | Discharge: 2024-04-01 | Disposition: A | Discharge status: Home / Self Care | Payer: Self-pay | Attending: Family Medicine | Admitting: Family Medicine

## 2024-04-01 ENCOUNTER — Other Ambulatory Visit: Payer: Self-pay

## 2024-04-01 DIAGNOSIS — O26891 Other specified pregnancy related conditions, first trimester: Secondary | ICD-10-CM | POA: Diagnosis not present

## 2024-04-01 DIAGNOSIS — O9A211 Injury, poisoning and certain other consequences of external causes complicating pregnancy, first trimester: Secondary | ICD-10-CM | POA: Insufficient documentation

## 2024-04-01 DIAGNOSIS — R1031 Right lower quadrant pain: Secondary | ICD-10-CM | POA: Diagnosis present

## 2024-04-01 DIAGNOSIS — Z3A11 11 weeks gestation of pregnancy: Secondary | ICD-10-CM | POA: Diagnosis not present

## 2024-04-01 DIAGNOSIS — T43505A Adverse effect of unspecified antipsychotics and neuroleptics, initial encounter: Secondary | ICD-10-CM | POA: Insufficient documentation

## 2024-04-01 DIAGNOSIS — R630 Anorexia: Secondary | ICD-10-CM

## 2024-04-01 DIAGNOSIS — A5402 Gonococcal vulvovaginitis, unspecified: Secondary | ICD-10-CM | POA: Insufficient documentation

## 2024-04-01 DIAGNOSIS — N898 Other specified noninflammatory disorders of vagina: Secondary | ICD-10-CM

## 2024-04-01 DIAGNOSIS — O0990 Supervision of high risk pregnancy, unspecified, unspecified trimester: Secondary | ICD-10-CM

## 2024-04-01 DIAGNOSIS — O98211 Gonorrhea complicating pregnancy, first trimester: Secondary | ICD-10-CM | POA: Insufficient documentation

## 2024-04-01 LAB — URINALYSIS, ROUTINE W REFLEX MICROSCOPIC
Bilirubin Urine: NEGATIVE
Glucose, UA: NEGATIVE mg/dL
Hgb urine dipstick: NEGATIVE
Ketones, ur: NEGATIVE mg/dL
Nitrite: NEGATIVE
Protein, ur: NEGATIVE mg/dL
Specific Gravity, Urine: 1.021 (ref 1.005–1.030)
pH: 6 (ref 5.0–8.0)

## 2024-04-01 LAB — WET PREP, GENITAL
Clue Cells Wet Prep HPF POC: NONE SEEN
Sperm: NONE SEEN
Trich, Wet Prep: NONE SEEN
WBC, Wet Prep HPF POC: >=10 — AB (ref <10)
Yeast Wet Prep HPF POC: NONE SEEN

## 2024-04-01 MED ORDER — BLOOD PRESSURE KIT DEVI: 1.0000 | 0 refills | Status: DC | PRN

## 2024-04-01 NOTE — MAU Note (Signed)
 Nicole [name] is a 33 y.o. at [redacted]w[redacted]d here in MAU reporting: not able to eat like I'm supposed to.  When she takes her meds (all her meds, her mental meds and other) at nights, it makes her feel off balance and feel off, and sick more like she used to. It makes her eat at night, but she can't stand to cook.  Wants to get her BP taken to make sure that isn't the issue. Wants to know if there is something they can give her to make her eat during the day.  Denies vag bleeding.  Has a little pain on the RLQ, stabbing pain.  Onset of complaint: 4days Pain score: moderate. Vitals:   04/01/24 1834  BP: 118/85  Pulse: 80  Resp: 18  Temp: 98.7 F (37.1 C)  SpO2: 100%     FHT:172 Lab orders placed from triage:  urine

## 2024-04-01 NOTE — Discharge Instructions (Addendum)
 It was a pleasure taking care of you today.  You are gaining weight appropriately so continue feeding as you are.  Be sure to follow-up with your primary OB provider.  Regarding her discharge I collected a wet prep and the results will come to your MyChart.  Be sure that your partner gets appropriately treated for the gonorrhea because the medication you are giving him is not an adequate treatment.  Do not have intercourse with him until he gets treated.  I hope you have a wonderful night!

## 2024-04-01 NOTE — MAU Provider Note (Signed)
 History     CSN: [id number]  Arrival date and time: 04/01/24 1813   Event Date/Time   First Provider Initiated Contact with Patient 04/01/24 1856      Chief Complaint  Patient presents with   feels off   poor appetite   Abdominal Pain   HPI Patient presents for evaluation because she reports she has decreased appetite and wanted to be evaluated.  Denies any nausea but feels like when she takes her antipsychotic medications at night it makes her too tired to cook so she just does not eat.  She is worried that she is not gaining weight.  Reports recent STI and was treated.  Reports she has not been sexually active with her partner and was giving her partner metronidazole  that she had leftover from a bacterial vaginosis infection because she thought that would treat the infection.  Discussed that this is not adequate treatment for gonorrhea and she should not have intercourse with him until he is also treated appropriately.  OB History     Gravida  2   Para  1   Term  1   Preterm  0   AB  0   Living  1      SAB  0   IAB  0   Ectopic  0   Multiple  0   Live Births  0           Past Medical History:  Diagnosis Date   Anxiety    Depression    Hypothyroidism 08/07/2022   Tobacco use disorder 08/07/2022    Past Surgical History:  Procedure Laterality Date   WISDOM TOOTH EXTRACTION Bilateral 2020    Family History  Problem Relation Age of Onset   Hypertension Father    Diabetes Father     Social History   Tobacco Use   Smoking status: Former    Current packs/day: 0.00    Types: Cigarettes    Quit date: 02/17/2024    Years since quitting: 0.1   Smokeless tobacco: Never  Vaping Use   Vaping status: Former   Quit date: 01/13/2024  Substance Use Topics   Alcohol use: Not Currently    Comment: weekly   Drug use: Not Currently    Types: Marijuana, Other-see comments    Comment: Xanax    Allergies:  Allergies  Allergen Reactions   Apple Juice  Anaphylaxis   Other Anaphylaxis and Other (See Comments)    SALMON   Watermelon Flavoring Agent (Non-Screening) Anaphylaxis    Facility-Administered Medications Prior to Admission  Medication Dose Route Frequency Provider Last Rate Last Admin   ARIPiprazole  ER (ABILIFY  MAINTENA) 400 MG prefilled syringe 400 mg  400 mg Intramuscular Q28 days    400 mg at 03/16/24 0951   Medications Prior to Admission  Medication Sig Dispense Refill Last Dose/Taking   ARIPiprazole  ER (ABILIFY  MAINTENA) 400 MG PRSY prefilled syringe Inject 400 mg into the muscle every 28 (twenty-eight) days. 1 each 11    Blood Pressure Monitoring (BLOOD PRESSURE KIT) DEVI 1 kit by Does not apply route as needed. 1 each 0    cefixime  (SUPRAX ) 400 MG CAPS capsule Take 2 capsules (800 mg total) by mouth daily. Partners will also need treatment. 2 capsule 0    cyclobenzaprine  (FLEXERIL ) 5 MG tablet Take 1 tablet (5 mg total) by mouth 3 (three) times daily as needed for muscle spasms. 20 tablet 0    folic acid  (FOLVITE ) 1 MG tablet Take 1  tablet (1 mg total) by mouth daily. 30 tablet 10    lamoTRIgine  (LAMICTAL ) 25 MG tablet Take 2 tablets (50 mg total) by mouth daily. 60 tablet 3    methocarbamol  (ROBAXIN ) 500 MG tablet Take 500 mg by mouth 3 (three) times daily as needed. (Patient not taking: Reported on 02/18/2024)      norethindrone  (MICRONOR ) 0.35 MG tablet Take 1 tablet (0.35 mg total) by mouth daily. (Patient not taking: Reported on 01/25/2024) 28 tablet 11    pantoprazole  (PROTONIX ) 40 MG tablet Take 40 mg by mouth daily.      sertraline  (ZOLOFT ) 100 MG tablet Take 1 tablet (100 mg total) by mouth at bedtime. 30 tablet 3    traZODone  (DESYREL ) 100 MG tablet Take 1 tablet (100 mg total) by mouth at bedtime. 30 tablet 3    valACYclovir  (VALTREX ) 500 MG tablet Take 1 tablet (500 mg total) by mouth daily. 30 tablet 8     Review of Systems  Constitutional:  Positive for appetite change and fatigue. Negative for fever.  HENT:   Negative for congestion and rhinorrhea.   Gastrointestinal:  Negative for abdominal pain, nausea and vomiting.  Genitourinary:  Positive for vaginal discharge. Negative for difficulty urinating and vaginal bleeding.  Neurological:  Negative for light-headedness and headaches.   Physical Exam   Blood pressure 118/85, pulse 80, temperature 98.7 F (37.1 C), temperature source Oral, resp. rate 18, height 5' 1 (1.549 m), weight 89.4 kg, last menstrual period 01/13/2024, SpO2 100%.  Physical Exam Vitals and nursing note reviewed.  Constitutional:      Appearance: Normal appearance.  HENT:     Head: Normocephalic and atraumatic.     Nose: No congestion or rhinorrhea.   Eyes:     Extraocular Movements: Extraocular movements intact.    Cardiovascular:     Rate and Rhythm: Normal rate.  Pulmonary:     Effort: Pulmonary effort is normal.  Abdominal:     Palpations: Abdomen is soft.     Tenderness: There is no abdominal tenderness.   Musculoskeletal:        General: Normal range of motion.     Cervical back: Normal range of motion.   Skin:    General: Skin is warm.     Capillary Refill: Capillary refill takes less than 2 seconds.   Neurological:     General: No focal deficit present.     Mental Status: She is alert.     Cranial Nerves: No cranial nerve deficit.   Psychiatric:        Attention and Perception: Attention normal.        Mood and Affect: Affect is flat.        Speech: Speech is tangential.     MAU Course  Procedures  MDM Physical exam Wet prep  Assessment and Plan  Nicole [name] is a 59 G2P1 @ [redacted]w[redacted]d presenting for concern of decreased appetite.  Decreased appetite Patient reports she was told by her doctor that she needs to make sure she is eating but she does not feel hungry and gets tired at night because of all of her antipsychotic medications.  She does report that one of her antipsychotics makes her hungry and she will eat.  She has gained 6  pounds since last being seen.  She was happy to hear that and relieved.  She will continue to eat as she has been and will follow-up with primary OB provider in the outpatient setting  Vaginal  discharge Patient reporting vaginal discharge.  Was diagnosed with gonorrhea on 6/6.  Did not pick up the prescription until the 11th.  Discharge is likely the gonorrhea but we will repeat a wet prep just to make sure antibiotics has not resulted in yeast or BV infections.  Patient reports she does not want to wait for the results so we will discharge home and she will look at her MyChart and get treated if needed.  Discussed that her partner must be treated with the appropriate treatment before they have intercourse where she will get gonorrhea again.  Calynn Ferrero V Kyi Romanello 04/01/2024, 7:15 PM

## 2024-04-01 NOTE — Progress Notes (Signed)
 S/w pt to follow up from after hours message that pt was experiencing headaches and dizziness. Pt states that she does not eat because she does not have an appetite and has not been drinking any water. Advised that symptoms could be related to dehydration and lack of nutrition. Pt denies nausea and vomiting and states that she has not taken and meds to treat headache. Pt is unable to come for BP check today, advised that I would send BP cuff to [facility] and let us  know the reading. Advised if symptoms do not improve report to mau, pt voiced understanding.

## 2024-04-05 ENCOUNTER — Inpatient Hospital Stay (HOSPITAL_COMMUNITY)
Admission: AD | Admit: 2024-04-05 | Discharge: 2024-04-06 | Disposition: A | Discharge status: Home / Self Care | Payer: Self-pay | Attending: Obstetrics and Gynecology | Admitting: Obstetrics and Gynecology

## 2024-04-05 ENCOUNTER — Inpatient Hospital Stay (HOSPITAL_COMMUNITY)

## 2024-04-05 DIAGNOSIS — O0990 Supervision of high risk pregnancy, unspecified, unspecified trimester: Secondary | ICD-10-CM

## 2024-04-05 DIAGNOSIS — O209 Hemorrhage in early pregnancy, unspecified: Secondary | ICD-10-CM | POA: Diagnosis present

## 2024-04-05 DIAGNOSIS — O99341 Other mental disorders complicating pregnancy, first trimester: Secondary | ICD-10-CM | POA: Insufficient documentation

## 2024-04-05 DIAGNOSIS — Z87891 Personal history of nicotine dependence: Secondary | ICD-10-CM | POA: Insufficient documentation

## 2024-04-05 DIAGNOSIS — Z3A12 12 weeks gestation of pregnancy: Secondary | ICD-10-CM | POA: Diagnosis not present

## 2024-04-05 DIAGNOSIS — O99281 Endocrine, nutritional and metabolic diseases complicating pregnancy, first trimester: Secondary | ICD-10-CM | POA: Insufficient documentation

## 2024-04-05 DIAGNOSIS — O99331 Smoking (tobacco) complicating pregnancy, first trimester: Secondary | ICD-10-CM | POA: Insufficient documentation

## 2024-04-05 LAB — URINALYSIS, ROUTINE W REFLEX MICROSCOPIC
Bilirubin Urine: NEGATIVE
Glucose, UA: NEGATIVE mg/dL
Ketones, ur: NEGATIVE mg/dL
Leukocytes,Ua: NEGATIVE
Nitrite: NEGATIVE
Protein, ur: 30 mg/dL — AB
Specific Gravity, Urine: 1.024 (ref 1.005–1.030)
pH: 6 (ref 5.0–8.0)

## 2024-04-05 LAB — WET PREP, GENITAL
Clue Cells Wet Prep HPF POC: NONE SEEN
Sperm: NONE SEEN
Trich, Wet Prep: NONE SEEN
WBC, Wet Prep HPF POC: <10 (ref <10)
Yeast Wet Prep HPF POC: NONE SEEN

## 2024-04-05 NOTE — MAU Provider Note (Signed)
 History     CSN: [id number]  Arrival date and time: 04/05/24 2142   Event Date/Time   First Provider Initiated Contact with Patient 04/05/24 2359      Chief Complaint  Patient presents with   Vaginal Bleeding   HPI Nicole [name] is a 33 y.o. G***P*** at [redacted]w[redacted]d who receives care at ***.  She presents today for vaginal bleeding.  Patient states that she was sitting on the couch, around 8pm, and felt like I peed on myself, but it was blood.  She states that it was bright clots, but no clots.  She reports discharge prior to this and denies recent sexual activity.   {GYN/OB [id number]}  Past Medical History:  Diagnosis Date   Anxiety    Depression    Hypothyroidism 08/07/2022   Tobacco use disorder 08/07/2022    Past Surgical History:  Procedure Laterality Date   WISDOM TOOTH EXTRACTION Bilateral 2020    Family History  Problem Relation Age of Onset   Hypertension Father    Diabetes Father     Social History   Tobacco Use   Smoking status: Former    Current packs/day: 0.00    Types: Cigarettes    Quit date: 02/17/2024    Years since quitting: 0.1   Smokeless tobacco: Never  Vaping Use   Vaping status: Former   Quit date: 01/13/2024  Substance Use Topics   Alcohol use: Not Currently    Comment: weekly   Drug use: Not Currently    Types: Marijuana, Other-see comments    Comment: Xanax    Allergies:  Allergies  Allergen Reactions   Apple Juice Anaphylaxis   Other Anaphylaxis and Other (See Comments)    SALMON   Watermelon Flavoring Agent (Non-Screening) Anaphylaxis    Facility-Administered Medications Prior to Admission  Medication Dose Route Frequency Provider Last Rate Last Admin   ARIPiprazole  ER (ABILIFY  MAINTENA) 400 MG prefilled syringe 400 mg  400 mg Intramuscular Q28 days    400 mg at 03/16/24 0951   Medications Prior to Admission  Medication Sig Dispense Refill Last Dose/Taking   ARIPiprazole  ER (ABILIFY  MAINTENA) 400 MG PRSY prefilled  syringe Inject 400 mg into the muscle every 28 (twenty-eight) days. 1 each 11    Blood Pressure Monitoring (BLOOD PRESSURE KIT) DEVI 1 kit by Does not apply route as needed. 1 each 0    cefixime  (SUPRAX ) 400 MG CAPS capsule Take 2 capsules (800 mg total) by mouth daily. Partners will also need treatment. 2 capsule 0    cyclobenzaprine  (FLEXERIL ) 5 MG tablet Take 1 tablet (5 mg total) by mouth 3 (three) times daily as needed for muscle spasms. 20 tablet 0    folic acid  (FOLVITE ) 1 MG tablet Take 1 tablet (1 mg total) by mouth daily. 30 tablet 10    lamoTRIgine  (LAMICTAL ) 25 MG tablet Take 2 tablets (50 mg total) by mouth daily. 60 tablet 3    methocarbamol  (ROBAXIN ) 500 MG tablet Take 500 mg by mouth 3 (three) times daily as needed. (Patient not taking: Reported on 02/18/2024)      norethindrone  (MICRONOR ) 0.35 MG tablet Take 1 tablet (0.35 mg total) by mouth daily. (Patient not taking: Reported on 01/25/2024) 28 tablet 11    pantoprazole  (PROTONIX ) 40 MG tablet Take 40 mg by mouth daily.      sertraline  (ZOLOFT ) 100 MG tablet Take 1 tablet (100 mg total) by mouth at bedtime. 30 tablet 3    traZODone  (DESYREL ) 100 MG tablet  Take 1 tablet (100 mg total) by mouth at bedtime. 30 tablet 3    valACYclovir  (VALTREX ) 500 MG tablet Take 1 tablet (500 mg total) by mouth daily. 30 tablet 8     Review of Systems  Gastrointestinal:  Negative for abdominal pain, constipation, diarrhea, nausea and vomiting.  Genitourinary:  Positive for vaginal bleeding. Negative for difficulty urinating, dysuria and vaginal discharge.   Physical Exam   Blood pressure 110/69, pulse 89, temperature 98.6 F (37 C), temperature source Oral, resp. rate 12, height 5' 1 (1.549 m), weight 88.8 kg, last menstrual period 01/13/2024.  Physical Exam  MAU Course  Procedures Results for orders placed or performed during the hospital encounter of 04/05/24 (from the past 24 hours)  Urinalysis, Routine w reflex microscopic -Urine, Clean  Catch     Status: Abnormal   Collection Time: 04/05/24 10:37 PM  Result Value Ref Range   Color, Urine YELLOW YELLOW   APPearance HAZY (A) CLEAR   Specific Gravity, Urine 1.024 1.005 - 1.030   pH 6.0 5.0 - 8.0   Glucose, UA NEGATIVE NEGATIVE mg/dL   Hgb urine dipstick LARGE (A) NEGATIVE   Bilirubin Urine NEGATIVE NEGATIVE   Ketones, ur NEGATIVE NEGATIVE mg/dL   Protein, ur 30 (A) NEGATIVE mg/dL   Nitrite NEGATIVE NEGATIVE   Leukocytes,Ua NEGATIVE NEGATIVE   RBC / HPF 0-5 0 - 5 RBC/hpf   WBC, UA 0-5 0 - 5 WBC/hpf   Bacteria, UA RARE (A) NONE SEEN   Squamous Epithelial / HPF 0-5 0 - 5 /HPF   Mucus PRESENT   Wet prep, genital     Status: None   Collection Time: 04/05/24 10:47 PM  Result Value Ref Range   Yeast Wet Prep HPF POC NONE SEEN NONE SEEN   Trich, Wet Prep NONE SEEN NONE SEEN   Clue Cells Wet Prep HPF POC NONE SEEN NONE SEEN   WBC, Wet Prep HPF POC <10 <10   Sperm NONE SEEN    US  OB Comp Less 14 Wks Result Date: 04/05/2024 CLINICAL DATA:  Vaginal bleeding during pregnancy EXAM: OBSTETRIC <14 WK ULTRASOUND TECHNIQUE: Transabdominal ultrasound was performed for evaluation of the gestation as well as the maternal uterus and adnexal regions. COMPARISON:  None Available. FINDINGS: Intrauterine gestational sac: Single Yolk sac:  Visualized. Embryo:  Visualized. Cardiac Activity: Visualized. Heart Rate: 167 bpm CRL:   55 mm   12 w 1 d                  US  EDC: 10/17/2024 Subchorionic hemorrhage:  None visualized. Maternal uterus/adnexae: Bilateral ovaries are visualized and appear within normal limits. There is no pelvic free fluid. IMPRESSION: 1. Single live intrauterine gestation measuring 12 weeks 1 day by crown-rump length. Electronically Signed   By: Greig Pique M.D.   On: 04/05/2024 23:30    MDM ***  Assessment and Plan  33 year old, G2P1001  SIUP at ***weeks ***  -Reviewed POC with patient. -Exam performed and findings discussed.  -*** -*** -***   Harlene LITTIE Duncans 04/05/2024, 11:59 PM

## 2024-04-05 NOTE — MAU Note (Signed)
 Pt says she came by EMS-  Says says she has VB - started at 8pm- red - in underwear. Pad on - in Triage - light amt- narrow streak . No pain .

## 2024-04-06 ENCOUNTER — Encounter: Payer: Self-pay | Admitting: Advanced Practice Midwife

## 2024-04-06 ENCOUNTER — Ambulatory Visit: Admitting: Advanced Practice Midwife

## 2024-04-06 VITALS — Wt 196.2 lb

## 2024-04-06 DIAGNOSIS — Z3A16 16 weeks gestation of pregnancy: Secondary | ICD-10-CM

## 2024-04-06 DIAGNOSIS — O0990 Supervision of high risk pregnancy, unspecified, unspecified trimester: Secondary | ICD-10-CM

## 2024-04-06 DIAGNOSIS — O0992 Supervision of high risk pregnancy, unspecified, second trimester: Secondary | ICD-10-CM

## 2024-04-06 DIAGNOSIS — O209 Hemorrhage in early pregnancy, unspecified: Secondary | ICD-10-CM | POA: Diagnosis not present

## 2024-04-06 LAB — GC/CHLAMYDIA PROBE AMP (~~LOC~~) NOT AT ARMC
Chlamydia: NEGATIVE
Comment: NEGATIVE
Comment: NORMAL
Neisseria Gonorrhea: NEGATIVE

## 2024-04-06 MED ORDER — ASPIRIN 81 MG PO TBEC: 81.0000 mg | DELAYED_RELEASE_TABLET | Freq: Every day | ORAL | 5 refills | Status: DC

## 2024-04-06 NOTE — Progress Notes (Signed)
 Subjective:   Nicole [name] is a 33 y.o. G2P1001 at [redacted]w[redacted]d by LMP being seen today for her first obstetrical visit.  Her obstetrical history is significant for SVD x 1 and has Schizophrenia, unspecified (HCC); MDD (major depressive disorder), recurrent episode, moderate (HCC); Hypothyroidism; Tobacco use disorder; Borderline intellectual functioning; FAS (fetal alcohol syndrome); Anxiety state; Insomnia due to other mental disorder; Schizoaffective disorder, bipolar type (HCC); Essential hypertension; Type 2 diabetes mellitus (HCC); and Supervision of high risk pregnancy, antepartum on their problem list.. Patient does intend to breast feed. Pregnancy history fully reviewed.  Patient reports bleeding.  HISTORY: OB History  Gravida Para Term Preterm AB Living  2 1 1  0 0 1  SAB IAB Ectopic Multiple Live Births  0 0 0 0 0    # Outcome Date GA Lbr Len/2nd Weight Sex Type Anes PTL Lv  2 Current           1 Term 03/25/18    F Vag-Spont      Past Medical History:  Diagnosis Date   Anxiety    Depression    Hypothyroidism 08/07/2022   Tobacco use disorder 08/07/2022   Past Surgical History:  Procedure Laterality Date   WISDOM TOOTH EXTRACTION Bilateral 2020   Family History  Problem Relation Age of Onset   Hypertension Father    Diabetes Father    Social History   Tobacco Use   Smoking status: Former    Current packs/day: 0.00    Types: Cigarettes    Quit date: 02/17/2024    Years since quitting: 0.1   Smokeless tobacco: Never  Vaping Use   Vaping status: Former   Quit date: 01/13/2024  Substance Use Topics   Alcohol use: Not Currently    Comment: weekly   Drug use: Not Currently    Types: Marijuana, Other-see comments    Comment: Xanax   Allergies  Allergen Reactions   Apple Juice Anaphylaxis   Other Anaphylaxis and Other (See Comments)    SALMON   Watermelon Flavoring Agent (Non-Screening) Anaphylaxis   Current Outpatient Medications on File Prior to Visit   Medication Sig Dispense Refill   Blood Pressure Monitoring (BLOOD PRESSURE KIT) DEVI 1 kit by Does not apply route as needed. 1 each 0   lamoTRIgine  (LAMICTAL ) 25 MG tablet Take 2 tablets (50 mg total) by mouth daily. 60 tablet 3   sertraline  (ZOLOFT ) 100 MG tablet Take 1 tablet (100 mg total) by mouth at bedtime. 30 tablet 3   traZODone  (DESYREL ) 100 MG tablet Take 1 tablet (100 mg total) by mouth at bedtime. 30 tablet 3   valACYclovir  (VALTREX ) 500 MG tablet Take 1 tablet (500 mg total) by mouth daily. 30 tablet 8   ARIPiprazole  ER (ABILIFY  MAINTENA) 400 MG PRSY prefilled syringe Inject 400 mg into the muscle every 28 (twenty-eight) days. 1 each 11   cefixime  (SUPRAX ) 400 MG CAPS capsule Take 2 capsules (800 mg total) by mouth daily. Partners will also need treatment. (Patient not taking: Reported on 04/06/2024) 2 capsule 0   cyclobenzaprine  (FLEXERIL ) 5 MG tablet Take 1 tablet (5 mg total) by mouth 3 (three) times daily as needed for muscle spasms. (Patient not taking: Reported on 04/06/2024) 20 tablet 0   folic acid  (FOLVITE ) 1 MG tablet Take 1 tablet (1 mg total) by mouth daily. (Patient not taking: Reported on 04/06/2024) 30 tablet 10   methocarbamol  (ROBAXIN ) 500 MG tablet Take 500 mg by mouth 3 (three) times daily  as needed. (Patient not taking: Reported on 04/06/2024)     norethindrone  (MICRONOR ) 0.35 MG tablet Take 1 tablet (0.35 mg total) by mouth daily. (Patient not taking: Reported on 04/06/2024) 28 tablet 11   pantoprazole  (PROTONIX ) 40 MG tablet Take 40 mg by mouth daily. (Patient not taking: Reported on 04/06/2024)     Current Facility-Administered Medications on File Prior to Visit  Medication Dose Route Frequency Provider Last Rate Last Admin   ARIPiprazole  ER (ABILIFY  MAINTENA) 400 MG prefilled syringe 400 mg  400 mg Intramuscular Q28 days    400 mg at 03/16/24 0951     Indications for ASA therapy (per uptodate) One of the following: Previous pregnancy with preeclampsia,  especially early onset and with an adverse outcome No Multifetal gestation No Chronic hypertension No Type 1 or 2 diabetes mellitus No Chronic kidney disease Yes Autoimmune disease (antiphospholipid syndrome, systemic lupus erythematosus) Yes   Two or more of the following: Nulliparity No Obesity (body mass index >30 kg/m2) Yes Family history of preeclampsia in mother or sister No Age >=35 years No Sociodemographic characteristics (African American race, low socioeconomic level) Yes Personal risk factors (eg, previous pregnancy with low birth weight or small for gestational age infant, previous adverse pregnancy outcome [eg, stillbirth], interval >10 years between pregnancies) No  Exam   Vitals:   04/06/24 1105  Weight: 196 lb 3.2 oz (89 kg)   Fetal Heart Rate (bpm): 175  VS reviewed, nursing note reviewed,  Constitutional: well developed, well nourished, no distress HEENT: normocephalic CV: normal rate Pulm/chest wall: normal effort Abdomen: soft Neuro: alert and oriented x 3 Skin: warm, dry Psych: affect normal    Assessment:   Pregnancy: G2P1001 Patient Active Problem List   Diagnosis Date Noted   Essential hypertension 02/18/2024   Type 2 diabetes mellitus (HCC) 02/18/2024   Supervision of high risk pregnancy, antepartum 02/18/2024   Schizoaffective disorder, bipolar type (HCC) 11/07/2023   Anxiety state 12/04/2022   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 Supervision of high risk pregnancy, antepartum (Primary) --Anticipatory guidance about next visits/weeks of pregnancy given.  --Start BASA, order placed for lower cost mail order pharmacy  -  PANORAMA PRENATAL TEST - HORIZON Basic Panel - Comp Met (CMET) - HgB A1c  - aspirin EC 81 MG tablet; Take 1 tablet (81 mg total) by mouth daily. Swallow whole.  Dispense: 90 tablet; Refill: 5  2. [redacted] weeks gestation of pregnancy   3. Vaginal bleeding in pregnancy, first trimester --Seen in MAU 04/05/24 for bleeding, normal IUP on US .  Bleeding improved but still present today. FHT normal in office today. --Defer Pap, consider at next visit or defer to PP. No hx abnormal Pap. --Bleeding precautions reviewed  Initial labs drawn. Continue prenatal vitamins. Discussed and offered genetic screening options, including Quad screen/AFP, NIPS testing, and option to decline testing. Benefits/risks/alternatives reviewed. Pt aware that anatomy US  is form of genetic screening with lower accuracy in detecting trisomies than blood work.  Pt chooses genetic screening today. NIPS: ordered. Ultrasound discussed; fetal anatomic survey: ordered. Problem list reviewed and updated. The nature of [facility] with multiple MDs and other Advanced Practice Providers was  explained to patient; also emphasized that residents, students are part of our team. Routine obstetric precautions reviewed. Return in about 4 weeks (around 05/04/2024) for Midwife preferred.   Olam Boards, CNM 04/06/24 1:09 PM

## 2024-04-06 NOTE — Progress Notes (Signed)
 Pt presents for NOB visit. Pt was seen at MAU for vaginal bleeding yesterday. Reports bleeding today.   Last PAP 2018

## 2024-04-07 LAB — COMPREHENSIVE METABOLIC PANEL WITH GFR
ALT: 14 IU/L (ref 0–32)
AST: 14 IU/L (ref 0–40)
Albumin: 4.1 g/dL (ref 4.0–5.0)
Alkaline Phosphatase: 86 IU/L (ref 44–121)
BUN/Creatinine Ratio: 12 (ref 9–23)
BUN: 8 mg/dL (ref 6–20)
Bilirubin Total: <0.2 mg/dL (ref 0.0–1.2)
CO2: 21 mmol/L (ref 20–29)
Calcium: 9.2 mg/dL (ref 8.7–10.2)
Chloride: 99 mmol/L (ref 96–106)
Creatinine, Ser: 0.68 mg/dL (ref 0.57–1.00)
Globulin, Total: 3 g/dL (ref 1.5–4.5)
Glucose: 67 mg/dL — ABNORMAL LOW (ref 70–99)
Potassium: 4.8 mmol/L (ref 3.5–5.2)
Sodium: 135 mmol/L (ref 134–144)
Total Protein: 7.1 g/dL (ref 6.0–8.5)
eGFR: 120 mL/min/{1.73_m2} (ref >59)

## 2024-04-07 LAB — HEMOGLOBIN A1C
Est. average glucose Bld gHb Est-mCnc: 108 mg/dL
Hgb A1c MFr Bld: 5.4 % (ref 4.8–5.6)

## 2024-04-11 LAB — PANORAMA PRENATAL TEST FULL PANEL:PANORAMA TEST PLUS 5 ADDITIONAL MICRODELETIONS: FETAL FRACTION: 5.2

## 2024-04-13 ENCOUNTER — Telehealth (HOSPITAL_COMMUNITY): Payer: Self-pay

## 2024-04-13 ENCOUNTER — Ambulatory Visit (INDEPENDENT_AMBULATORY_CARE_PROVIDER_SITE_OTHER)

## 2024-04-13 VITALS — BP 98/69 | HR 87 | Ht 61.0 in | Wt 195.0 lb

## 2024-04-13 DIAGNOSIS — F250 Schizoaffective disorder, bipolar type: Secondary | ICD-10-CM

## 2024-04-13 NOTE — Progress Notes (Cosign Needed)
 Pt presents today for the injection of Abilify  maintenna 400mg . Patient state states she is doing good and is expecting a little bundle of joy. Pt is [redacted] weeks pregnant and is very excited about baby. Pt tolerated injection in L-deltoid with no problems. Pt states she is having a little girl.    JNL, CMA

## 2024-04-13 NOTE — Telephone Encounter (Signed)
 HELLO,   PT STATES THAT SHE WANTS HER MEDS TO BE DELIVERED FROM [facility] SO THEY WILL BE HERE FOR INJECTION DATES.    JNL

## 2024-04-14 ENCOUNTER — Other Ambulatory Visit (HOSPITAL_COMMUNITY): Payer: Self-pay | Admitting: Psychiatry

## 2024-04-14 DIAGNOSIS — F411 Generalized anxiety disorder: Secondary | ICD-10-CM

## 2024-04-14 DIAGNOSIS — G479 Sleep disorder, unspecified: Secondary | ICD-10-CM

## 2024-04-15 ENCOUNTER — Inpatient Hospital Stay (HOSPITAL_COMMUNITY)
Admission: AD | Admit: 2024-04-15 | Discharge: 2024-04-15 | Disposition: A | Discharge status: Home / Self Care | Payer: Self-pay | Attending: Obstetrics and Gynecology | Admitting: Obstetrics and Gynecology

## 2024-04-15 DIAGNOSIS — O0991 Supervision of high risk pregnancy, unspecified, first trimester: Secondary | ICD-10-CM | POA: Insufficient documentation

## 2024-04-15 DIAGNOSIS — F250 Schizoaffective disorder, bipolar type: Secondary | ICD-10-CM | POA: Diagnosis not present

## 2024-04-15 DIAGNOSIS — O24111 Pre-existing diabetes mellitus, type 2, in pregnancy, first trimester: Secondary | ICD-10-CM | POA: Diagnosis not present

## 2024-04-15 DIAGNOSIS — O99341 Other mental disorders complicating pregnancy, first trimester: Secondary | ICD-10-CM | POA: Diagnosis not present

## 2024-04-15 DIAGNOSIS — O4691 Antepartum hemorrhage, unspecified, first trimester: Secondary | ICD-10-CM | POA: Insufficient documentation

## 2024-04-15 DIAGNOSIS — O99891 Other specified diseases and conditions complicating pregnancy: Secondary | ICD-10-CM | POA: Insufficient documentation

## 2024-04-15 DIAGNOSIS — O209 Hemorrhage in early pregnancy, unspecified: Secondary | ICD-10-CM

## 2024-04-15 DIAGNOSIS — Z3A13 13 weeks gestation of pregnancy: Secondary | ICD-10-CM | POA: Diagnosis not present

## 2024-04-15 DIAGNOSIS — R079 Chest pain, unspecified: Secondary | ICD-10-CM | POA: Diagnosis present

## 2024-04-15 DIAGNOSIS — M940 Chondrocostal junction syndrome [Tietze]: Secondary | ICD-10-CM | POA: Diagnosis not present

## 2024-04-15 LAB — WET PREP, GENITAL
Clue Cells Wet Prep HPF POC: NONE SEEN
Sperm: NONE SEEN
Trich, Wet Prep: NONE SEEN
WBC, Wet Prep HPF POC: <10 (ref <10)
Yeast Wet Prep HPF POC: NONE SEEN

## 2024-04-15 LAB — URINALYSIS, ROUTINE W REFLEX MICROSCOPIC
Bilirubin Urine: NEGATIVE
Glucose, UA: NEGATIVE mg/dL
Hgb urine dipstick: NEGATIVE
Ketones, ur: NEGATIVE mg/dL
Leukocytes,Ua: NEGATIVE
Nitrite: NEGATIVE
Protein, ur: NEGATIVE mg/dL
Specific Gravity, Urine: 1.018 (ref 1.005–1.030)
pH: 7 (ref 5.0–8.0)

## 2024-04-15 MED ORDER — FAMOTIDINE 40 MG PO TABS: 40.0000 mg | ORAL_TABLET | Freq: Every day | ORAL | 3 refills | Status: DC

## 2024-04-15 NOTE — Discharge Instructions (Signed)
 It was great seeing you today.  I am not sure what was causing the bleeding previously but it is good that you are not bleeding at this time.  We did collect a sample to look for any bacterial vaginosis, yeast, gonorrhea or chlamydia but those results have not returned at the time of your discharge.  If they come back positive for anything I will send treatment into your pharmacy and send you a MyChart message.  If your bleeding worsens I want you to return immediately.  Regarding your chest pain you were tender when we pressed right on your chest.  It is likely muscular so you can take 1000 mg of Tylenol  every 8 hours as needed for the pain.  Only take the Tylenol  if you are having pain.  If you have any other concerns please return for further evaluation.  I hope you have a great rest of your night!

## 2024-04-15 NOTE — MAU Provider Note (Addendum)
 Chief Complaint:  Chest pain and vaginal bleeding.  HPI      Nicole [name] is a 33 y.o. G2P1001 at [redacted]w[redacted]d who presents to maternity admissions reporting chest pain with palpation and scant vaginal bleeding.  Patient reports that the vaginal bleeding comes and goes.  Denies any recent intercourse. Patient states that symptoms started last week and have been accompanied by nausea, vomiting, dizzy spells and increased urinary frequency. She denies any cough or trauma to her chest.    Her obstetrical history is significant for SVD x 1 and has Schizophrenia, unspecified (HCC); MDD (major depressive disorder), recurrent episode, moderate (HCC); Hypothyroidism; Tobacco use disorder; Borderline intellectual functioning; FAS (fetal alcohol syndrome); Anxiety state; Insomnia due to other mental disorder; Schizoaffective disorder, bipolar type (HCC); Essential hypertension; Type 2 diabetes mellitus (HCC); and Supervision of high risk pregnancy, antepartum on their problem list   Pregnancy Course: 13 weeks 2 days  Past Medical History:  Diagnosis Date   Anxiety    Depression    Hypothyroidism 08/07/2022   Tobacco use disorder 08/07/2022   OB History  Gravida Para Term Preterm AB Living  2 1 1  0 0 1  SAB IAB Ectopic Multiple Live Births  0 0 0 0 0    # Outcome Date GA Lbr Len/2nd Weight Sex Type Anes PTL Lv  2 Current           1 Term 03/25/18    F Vag-Spont      Past Surgical History:  Procedure Laterality Date   WISDOM TOOTH EXTRACTION Bilateral 2020   Family History  Problem Relation Age of Onset   Hypertension Father    Diabetes Father    Social History   Tobacco Use   Smoking status: Former    Current packs/day: 0.00    Types: Cigarettes    Quit date: 02/17/2024    Years since quitting: 0.1   Smokeless tobacco: Never  Vaping Use   Vaping status: Former   Quit date: 01/13/2024  Substance Use Topics   Alcohol use: Not Currently    Comment: weekly   Drug use: Not Currently     Types: Marijuana, Other-see comments    Comment: Xanax   Allergies  Allergen Reactions   Apple Juice Anaphylaxis   Other Anaphylaxis and Other (See Comments)    SALMON   Watermelon Flavoring Agent (Non-Screening) Anaphylaxis   Facility-Administered Medications Prior to Admission  Medication Dose Route Frequency Provider Last Rate Last Admin   ARIPiprazole  ER (ABILIFY  MAINTENA) 400 MG prefilled syringe 400 mg  400 mg Intramuscular Q28 days    400 mg at 04/13/24 1007   Medications Prior to Admission  Medication Sig Dispense Refill Last Dose/Taking   ARIPiprazole  ER (ABILIFY  MAINTENA) 400 MG PRSY prefilled syringe Inject 400 mg into the muscle every 28 (twenty-eight) days. 1 each 11    aspirin  EC 81 MG tablet Take 1 tablet (81 mg total) by mouth daily. Swallow whole. 90 tablet 5    Blood Pressure Monitoring (BLOOD PRESSURE KIT) DEVI 1 kit by Does not apply route as needed. 1 each 0    cefixime  (SUPRAX ) 400 MG CAPS capsule Take 2 capsules (800 mg total) by mouth daily. Partners will also need treatment. (Patient not taking: Reported on 04/06/2024) 2 capsule 0    cyclobenzaprine  (FLEXERIL ) 5 MG tablet Take 1 tablet (5 mg total) by mouth 3 (three) times daily as needed for muscle spasms. (Patient not taking: Reported on 04/06/2024) 20 tablet 0  folic acid  (FOLVITE ) 1 MG tablet Take 1 tablet (1 mg total) by mouth daily. (Patient not taking: Reported on 04/06/2024) 30 tablet 10    lamoTRIgine  (LAMICTAL ) 25 MG tablet Take 2 tablets (50 mg total) by mouth daily. 60 tablet 3    methocarbamol  (ROBAXIN ) 500 MG tablet Take 500 mg by mouth 3 (three) times daily as needed. (Patient not taking: Reported on 04/06/2024)      norethindrone  (MICRONOR ) 0.35 MG tablet Take 1 tablet (0.35 mg total) by mouth daily. (Patient not taking: Reported on 04/06/2024) 28 tablet 11    pantoprazole  (PROTONIX ) 40 MG tablet Take 40 mg by mouth daily. (Patient not taking: Reported on 04/06/2024)      sertraline  (ZOLOFT ) 100 MG  tablet Take 1 tablet (100 mg total) by mouth at bedtime. 30 tablet 3    traZODone  (DESYREL ) 100 MG tablet Take 1 tablet (100 mg total) by mouth at bedtime. 30 tablet 3    valACYclovir  (VALTREX ) 500 MG tablet Take 1 tablet (500 mg total) by mouth daily. 30 tablet 8     I have reviewed patient's Past Medical Hx, Surgical Hx, Family Hx, Social Hx, medications and allergies.   ROS   Review of Systems - Negative except for chest pain and vaginal bleeding History obtained from the patient General ROS: negative Psychological ROS: negative Respiratory ROS: no cough, shortness of breath, or wheezing Cardiovascular ROS: no chest pain or dyspnea on exertion Gastrointestinal ROS: no abdominal pain, change in bowel habits, or black or bloody stools   PHYSICAL EXAM  Patient Vitals for the past 24 hrs:  BP Temp Temp src Pulse Resp SpO2 Height Weight  04/15/24 1555 123/77 98.8 F (37.1 C) Oral 88 16 100 % 5' 1 (1.549 m) 89.8 kg    Constitutional: Well-developed, well-nourished female in no acute distress.  Cardiovascular: normal rate & rhythm, warm and well-perfused Respiratory: normal effort, no problems with respiration noted GI: Abd soft, non-tender, non-distended MS: Extremities nontender, no edema, normal ROM GU: no CVA tenderness     Labs: Lab Orders         Wet prep, genital         Urinalysis, Routine w reflex microscopic -Urine, Clean Catch      MDM & MAU COURSE  MDM: Low   MAU Course: Orders Placed This Encounter  Procedures   Wet prep, genital   Urinalysis, Routine w reflex microscopic -Urine, Clean Catch    ASSESSMENT  Pregnancy G2P1 at 13wks and 2 days  Primary Diagnosis: Costochondritis and mild vaginal bleeding   Patient Active Problem List    Diagnosis Date Noted   Essential hypertension 02/18/2024   Type 2 diabetes mellitus (HCC) 02/18/2024   Supervision of high risk pregnancy, antepartum 02/18/2024   Schizoaffective disorder, bipolar type (HCC)  11/07/2023   Anxiety state 12/04/2022   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   Borderline intellectual functioning 09/10/2022      Class: Chronic   Hypothyroidism 08/07/2022   Tobacco use disorder 08/07/2022   MDD (major depressive disorder), recurrent episode, moderate (HCC)     Schizophrenia, unspecified (HCC) 06/10/2022    PLAN  Discharge home in stable condition with return precautions and follow up outpatient  since patient is not actively bleeding now and only feels pain with palpation of her chest.  Chest discomfort likely irritation from persistent vomiting.  Discussed use of Pepcid and prescription sent to patient's pharmacy.  Also discussed use  of Tylenol  to help with the discomfort.    Regarding the bleeding unsure etiology.  Wet prep collected but patient left prior to results.  If any abnormalities will send prescription to patient's pharmacy to treat.  - Patient informed that she will be informed about her results and medicine will be sent to her pharmacy if her test results come back positive. - Patient advised to drink plenty of water to prevent dehydration.  Follow up: Patient advised to return to the MAU for any worsening symptoms.   Dominga Bakes PA Student

## 2024-04-15 NOTE — MAU Note (Signed)
 Nicole [name] is a 33 y.o. at [redacted]w[redacted]d here in MAU reporting: been bleeding on and off.started on Monday, no bleeding today. No pain now.  Just wanting to check on the baby, make sure she is ok.   Onset of complaint: Monday Pain: none now Vitals:   04/15/24 1555  BP: 123/77  Pulse: 88  Resp: 16  Temp: 98.8 F (37.1 C)  SpO2: 100%     FHT:158 Lab orders placed from triage:

## 2024-04-17 LAB — HORIZON CUSTOM: REPORT SUMMARY: NEGATIVE

## 2024-04-19 LAB — GC/CHLAMYDIA PROBE AMP (~~LOC~~) NOT AT ARMC
Chlamydia: NEGATIVE
Comment: NEGATIVE
Comment: NORMAL
Neisseria Gonorrhea: NEGATIVE

## 2024-04-24 ENCOUNTER — Other Ambulatory Visit: Payer: Self-pay

## 2024-04-24 ENCOUNTER — Encounter (HOSPITAL_COMMUNITY): Payer: Self-pay | Admitting: Obstetrics and Gynecology

## 2024-04-24 ENCOUNTER — Inpatient Hospital Stay (HOSPITAL_COMMUNITY)
Admission: AD | Admit: 2024-04-24 | Discharge: 2024-04-25 | Disposition: A | Discharge status: Home / Self Care | Payer: Self-pay | Attending: Obstetrics and Gynecology | Admitting: Obstetrics and Gynecology

## 2024-04-24 DIAGNOSIS — Z5982 Transportation insecurity: Secondary | ICD-10-CM | POA: Insufficient documentation

## 2024-04-24 DIAGNOSIS — F78A9 Other genetic related intellectual disability: Secondary | ICD-10-CM | POA: Insufficient documentation

## 2024-04-24 DIAGNOSIS — F331 Major depressive disorder, recurrent, moderate: Secondary | ICD-10-CM | POA: Insufficient documentation

## 2024-04-24 DIAGNOSIS — O24112 Pre-existing diabetes mellitus, type 2, in pregnancy, second trimester: Secondary | ICD-10-CM | POA: Diagnosis not present

## 2024-04-24 DIAGNOSIS — O0992 Supervision of high risk pregnancy, unspecified, second trimester: Secondary | ICD-10-CM | POA: Diagnosis not present

## 2024-04-24 DIAGNOSIS — Z3A14 14 weeks gestation of pregnancy: Secondary | ICD-10-CM | POA: Insufficient documentation

## 2024-04-24 DIAGNOSIS — E039 Hypothyroidism, unspecified: Secondary | ICD-10-CM | POA: Diagnosis not present

## 2024-04-24 DIAGNOSIS — R519 Headache, unspecified: Secondary | ICD-10-CM | POA: Insufficient documentation

## 2024-04-24 DIAGNOSIS — O10012 Pre-existing essential hypertension complicating pregnancy, second trimester: Secondary | ICD-10-CM | POA: Insufficient documentation

## 2024-04-24 DIAGNOSIS — O99282 Endocrine, nutritional and metabolic diseases complicating pregnancy, second trimester: Secondary | ICD-10-CM | POA: Insufficient documentation

## 2024-04-24 DIAGNOSIS — Z87891 Personal history of nicotine dependence: Secondary | ICD-10-CM | POA: Insufficient documentation

## 2024-04-24 DIAGNOSIS — O26892 Other specified pregnancy related conditions, second trimester: Secondary | ICD-10-CM | POA: Diagnosis not present

## 2024-04-24 DIAGNOSIS — Q860 Fetal alcohol syndrome (dysmorphic): Secondary | ICD-10-CM | POA: Insufficient documentation

## 2024-04-24 DIAGNOSIS — F250 Schizoaffective disorder, bipolar type: Secondary | ICD-10-CM | POA: Insufficient documentation

## 2024-04-24 DIAGNOSIS — O99342 Other mental disorders complicating pregnancy, second trimester: Secondary | ICD-10-CM | POA: Diagnosis not present

## 2024-04-24 DIAGNOSIS — R0602 Shortness of breath: Secondary | ICD-10-CM | POA: Diagnosis present

## 2024-04-24 DIAGNOSIS — R42 Dizziness and giddiness: Secondary | ICD-10-CM | POA: Insufficient documentation

## 2024-04-24 NOTE — MAU Provider Note (Incomplete)
 Chief Complaint: No chief complaint on file.  SUBJECTIVE HPI: Nicole [name] is a 33 y.o. G2P1001 at [redacted]w[redacted]d by LMP who presents to maternity admissions reporting via EMS for 9/10 headache all day not relieved by aspirin , and SOB with activity.  She denies vaginal bleeding, vaginal itching/burning, urinary symptoms, h/a, dizziness, n/v, or fever/chills.    Pregnancy c/b SVD x 1, Schizophrenia on Abilify  injection, unspecified (HCC); MDD (major depressive disorder), recurrent episode, moderate (HCC); Hypothyroidism; Tobacco use disorder; Borderline intellectual functioning; FAS (fetal alcohol syndrome); Anxiety state; Insomnia due to other mental disorder; Schizoaffective disorder, bipolar type (HCC); Essential hypertension; Type 2 diabetes mellitus (HCC); and Supervision of high risk pregnancy, antepartum on their problem list   HPI  Past Medical History:  Diagnosis Date  . Anxiety   . Depression   . Hypothyroidism 08/07/2022  . Tobacco use disorder 08/07/2022   Past Surgical History:  Procedure Laterality Date  . WISDOM TOOTH EXTRACTION Bilateral 2020   Social History   Socioeconomic History  . Marital status: Single    Spouse name: Not on file  . Number of children: Not on file  . Years of education: Not on file  . Highest education level: Not on file  Occupational History  . Not on file  Tobacco Use  . Smoking status: Former    Current packs/day: 0.00    Types: Cigarettes    Quit date: 02/17/2024    Years since quitting: 0.1  . Smokeless tobacco: Never  Vaping Use  . Vaping status: Former  . Quit date: 01/13/2024  Substance and Sexual Activity  . Alcohol use: Not Currently    Comment: weekly  . Drug use: Not Currently    Types: Marijuana, Other-see comments    Comment: Xanax  . Sexual activity: Yes    Partners: Female  Other Topics Concern  . Not on file  Social History Narrative  . Not on file   Social Drivers of Health   Financial Resource Strain: Low Risk   (12/13/2022)   Overall Financial Resource Strain (CARDIA)   . Difficulty of Paying Living Expenses: Not hard at all  Food Insecurity: No Food Insecurity (01/24/2024)   Hunger Vital Sign   . Worried About Programme researcher, broadcasting/film/video in the Last Year: Never true   . Ran Out of Food in the Last Year: Never true  Transportation Needs: Unmet Transportation Needs (01/24/2024)   PRAPARE - Transportation   . Lack of Transportation (Medical): Yes   . Lack of Transportation (Non-Medical): Yes  Physical Activity: Inactive (12/13/2022)   Exercise Vital Sign   . Days of Exercise per Week: 0 days   . Minutes of Exercise per Session: 0 min  Stress: Stress Concern Present (12/13/2022)   [employer] of Occupational Health - Occupational Stress Questionnaire   . Feeling of Stress : Very much  Social Connections: Socially Integrated (12/13/2022)   Social Connection and Isolation Panel   . Frequency of Communication with Friends and Family: More than three times a week   . Frequency of Social Gatherings with Friends and Family: Three times a week   . Attends Religious Services: More than 4 times per year   . Active Member of Clubs or Organizations: Yes   . Attends Banker Meetings: More than 4 times per year   . Marital Status: Living with partner  Intimate Partner Violence: Not At Risk (01/24/2024)   Humiliation, Afraid, Rape, and Kick questionnaire   . Fear of Current or  Ex-Partner: No   . Emotionally Abused: No   . Physically Abused: No   . Sexually Abused: No   No current facility-administered medications on file prior to encounter.   Current Outpatient Medications on File Prior to Encounter  Medication Sig Dispense Refill  . ARIPiprazole  ER (ABILIFY  MAINTENA) 400 MG PRSY prefilled syringe Inject 400 mg into the muscle every 28 (twenty-eight) days. 1 each 11  . aspirin  EC 81 MG tablet Take 1 tablet (81 mg total) by mouth daily. Swallow whole. 90 tablet 5  . Blood Pressure Monitoring (BLOOD  PRESSURE KIT) DEVI 1 kit by Does not apply route as needed. 1 each 0  . cefixime  (SUPRAX ) 400 MG CAPS capsule Take 2 capsules (800 mg total) by mouth daily. Partners will also need treatment. (Patient not taking: Reported on 04/06/2024) 2 capsule 0  . cyclobenzaprine  (FLEXERIL ) 5 MG tablet Take 1 tablet (5 mg total) by mouth 3 (three) times daily as needed for muscle spasms. (Patient not taking: Reported on 04/06/2024) 20 tablet 0  . famotidine  (PEPCID ) 40 MG tablet Take 1 tablet (40 mg total) by mouth daily. 90 tablet 3  . folic acid  (FOLVITE ) 1 MG tablet Take 1 tablet (1 mg total) by mouth daily. (Patient not taking: Reported on 04/06/2024) 30 tablet 10  . lamoTRIgine  (LAMICTAL ) 25 MG tablet Take 2 tablets (50 mg total) by mouth daily. 60 tablet 3  . methocarbamol  (ROBAXIN ) 500 MG tablet Take 500 mg by mouth 3 (three) times daily as needed. (Patient not taking: Reported on 04/06/2024)    . norethindrone  (MICRONOR ) 0.35 MG tablet Take 1 tablet (0.35 mg total) by mouth daily. (Patient not taking: Reported on 04/06/2024) 28 tablet 11  . pantoprazole  (PROTONIX ) 40 MG tablet Take 40 mg by mouth daily. (Patient not taking: Reported on 04/06/2024)    . sertraline  (ZOLOFT ) 100 MG tablet Take 1 tablet (100 mg total) by mouth at bedtime. 30 tablet 3  . traZODone  (DESYREL ) 100 MG tablet Take 1 tablet (100 mg total) by mouth at bedtime. 30 tablet 3  . valACYclovir  (VALTREX ) 500 MG tablet Take 1 tablet (500 mg total) by mouth daily. 30 tablet 8   Allergies  Allergen Reactions  . Apple Juice Anaphylaxis  . Other Anaphylaxis and Other (See Comments)    SALMON  . Watermelon Flavoring Agent (Non-Screening) Anaphylaxis    ROS:  Pertinent positives/negatives listed above.  I have reviewed patient's Past Medical Hx, Surgical Hx, Family Hx, Social Hx, medications and allergies.   Physical Exam  No data found. Constitutional: Well-developed, well-nourished female in no acute distress.  Cardiovascular: normal  rate Respiratory: normal effort GI: Abd soft, non-tender. Pos BS x 4 MS: Extremities nontender, no edema, normal ROM Neurologic: Alert and oriented x 4.  GU: Neg CVAT.  PELVIC EXAM: Cervix pink, visually closed, without lesion, scant white creamy discharge, vaginal walls and external genitalia normal Bimanual exam: Cervix 0/long/high, firm, anterior, neg CMT, uterus nontender, nonenlarged, adnexa without tenderness, enlargement, or mass  Doppler: ***  LAB RESULTS No results found for this or any previous visit (from the past 24 hours).     IMAGING US  OB Comp Less 14 Wks Result Date: 04/05/2024 CLINICAL DATA:  Vaginal bleeding during pregnancy EXAM: OBSTETRIC <14 WK ULTRASOUND TECHNIQUE: Transabdominal ultrasound was performed for evaluation of the gestation as well as the maternal uterus and adnexal regions. COMPARISON:  None Available. FINDINGS: Intrauterine gestational sac: Single Yolk sac:  Visualized. Embryo:  Visualized. Cardiac Activity: Visualized. Heart Rate: 167  bpm CRL:   55 mm   12 w 1 d                  US  EDC: 10/17/2024 Subchorionic hemorrhage:  None visualized. Maternal uterus/adnexae: Bilateral ovaries are visualized and appear within normal limits. There is no pelvic free fluid. IMPRESSION: 1. Single live intrauterine gestation measuring 12 weeks 1 day by crown-rump length. Electronically Signed   By: Greig Pique M.D.   On: 04/05/2024 23:30    MAU Management/MDM: No orders of the defined types were placed in this encounter.   No orders of the defined types were placed in this encounter.   Consult ***.  Treatments in MAU included ***.  ASSESSMENT No diagnosis found.  PLAN Discharge home with strict return precautions. Allergies as of 04/24/2024       Reactions   Apple Juice Anaphylaxis   Other Anaphylaxis, Other (See Comments)   SALMON   Watermelon Flavoring Agent (non-screening) Anaphylaxis     Med Rec must be completed prior to using this  SMARTLINK***        Mardy Shropshire, MD FMOB Fellow, Faculty practice [facility], [facility]  04/24/2024  11:18 PM

## 2024-04-24 NOTE — MAU Provider Note (Signed)
 Chief Complaint: Headache, Shortness of Breath, and Dizziness  SUBJECTIVE HPI: Nicole [name] is a 33 y.o. G2P1001 at [redacted]w[redacted]d by LMP who presents to maternity admissions reporting via EMS for 9/10 headache all day not relieved by aspirin , and brief lightheadedness with standing or quick position changes.   She denies vaginal bleeding, vaginal itching/burning, urinary symptoms, h/a, dizziness, n/v, or fever/chills.    Pregnancy c/b SVD x 1, Schizophrenia on Abilify  injection, unspecified (HCC); MDD (major depressive disorder), recurrent episode, moderate (HCC); Hypothyroidism; Tobacco use disorder; Borderline intellectual functioning; FAS (fetal alcohol syndrome); Anxiety state; Insomnia due to other mental disorder; Schizoaffective disorder, bipolar type (HCC); Essential hypertension; Type 2 diabetes mellitus (HCC); and Supervision of high risk pregnancy, antepartum on their problem list   HPI  Past Medical History:  Diagnosis Date   Anxiety    Depression    Hypothyroidism 08/07/2022   Tobacco use disorder 08/07/2022   Past Surgical History:  Procedure Laterality Date   WISDOM TOOTH EXTRACTION Bilateral 2020   Social History   Socioeconomic History   Marital status: Single    Spouse name: Not on file   Number of children: Not on file   Years of education: Not on file   Highest education level: Not on file  Occupational History   Not on file  Tobacco Use   Smoking status: Every Day    Current packs/day: 0.00    Types: Cigarettes    Last attempt to quit: 02/17/2024    Years since quitting: 0.1   Smokeless tobacco: Never  Vaping Use   Vaping status: Former   Quit date: 01/13/2024  Substance and Sexual Activity   Alcohol use: Not Currently    Comment: weekly   Drug use: Not Currently    Types: Marijuana, Other-see comments    Comment: Xanax   Sexual activity: Yes    Partners: Female  Other Topics Concern   Not on file  Social History Narrative   Not on file   Social  Drivers of Health   Financial Resource Strain: Low Risk  (12/13/2022)   Overall Financial Resource Strain (CARDIA)    Difficulty of Paying Living Expenses: Not hard at all  Food Insecurity: No Food Insecurity (01/24/2024)   Hunger Vital Sign    Worried About Running Out of Food in the Last Year: Never true    Ran Out of Food in the Last Year: Never true  Transportation Needs: Unmet Transportation Needs (01/24/2024)   PRAPARE - Transportation    Lack of Transportation (Medical): Yes    Lack of Transportation (Non-Medical): Yes  Physical Activity: Inactive (12/13/2022)   Exercise Vital Sign    Days of Exercise per Week: 0 days    Minutes of Exercise per Session: 0 min  Stress: Stress Concern Present (12/13/2022)   [employer] of Occupational Health - Occupational Stress Questionnaire    Feeling of Stress : Very much  Social Connections: Socially Integrated (12/13/2022)   Social Connection and Isolation Panel    Frequency of Communication with Friends and Family: More than three times a week    Frequency of Social Gatherings with Friends and Family: Three times a week    Attends Religious Services: More than 4 times per year    Active Member of Clubs or Organizations: Yes    Attends Banker Meetings: More than 4 times per year    Marital Status: Living with partner  Intimate Partner Violence: Not At Risk (01/24/2024)   Humiliation, Afraid, Rape,  and Kick questionnaire    Fear of Current or Ex-Partner: No    Emotionally Abused: No    Physically Abused: No    Sexually Abused: No   No current facility-administered medications on file prior to encounter.   Current Outpatient Medications on File Prior to Encounter  Medication Sig Dispense Refill   ARIPiprazole  ER (ABILIFY  MAINTENA) 400 MG PRSY prefilled syringe Inject 400 mg into the muscle every 28 (twenty-eight) days. 1 each 11   aspirin  EC 81 MG tablet Take 1 tablet (81 mg total) by mouth daily. Swallow whole. 90 tablet 5    famotidine  (PEPCID ) 40 MG tablet Take 1 tablet (40 mg total) by mouth daily. 90 tablet 3   lamoTRIgine  (LAMICTAL ) 25 MG tablet Take 2 tablets (50 mg total) by mouth daily. 60 tablet 3   sertraline  (ZOLOFT ) 100 MG tablet Take 1 tablet (100 mg total) by mouth at bedtime. 30 tablet 3   traZODone  (DESYREL ) 100 MG tablet Take 1 tablet (100 mg total) by mouth at bedtime. 30 tablet 3   valACYclovir  (VALTREX ) 500 MG tablet Take 1 tablet (500 mg total) by mouth daily. 30 tablet 8   Blood Pressure Monitoring (BLOOD PRESSURE KIT) DEVI 1 kit by Does not apply route as needed. 1 each 0   cefixime  (SUPRAX ) 400 MG CAPS capsule Take 2 capsules (800 mg total) by mouth daily. Partners will also need treatment. (Patient not taking: Reported on 04/06/2024) 2 capsule 0   cyclobenzaprine  (FLEXERIL ) 5 MG tablet Take 1 tablet (5 mg total) by mouth 3 (three) times daily as needed for muscle spasms. (Patient not taking: Reported on 04/06/2024) 20 tablet 0   folic acid  (FOLVITE ) 1 MG tablet Take 1 tablet (1 mg total) by mouth daily. (Patient not taking: Reported on 04/06/2024) 30 tablet 10   methocarbamol  (ROBAXIN ) 500 MG tablet Take 500 mg by mouth 3 (three) times daily as needed. (Patient not taking: Reported on 04/06/2024)     norethindrone  (MICRONOR ) 0.35 MG tablet Take 1 tablet (0.35 mg total) by mouth daily. (Patient not taking: Reported on 04/06/2024) 28 tablet 11   pantoprazole  (PROTONIX ) 40 MG tablet Take 40 mg by mouth daily. (Patient not taking: Reported on 04/06/2024)     Allergies  Allergen Reactions   Apple Juice Anaphylaxis   Other Anaphylaxis and Other (See Comments)    SALMON   Watermelon Flavoring Agent (Non-Screening) Anaphylaxis    ROS:  Pertinent positives/negatives listed above.  I have reviewed patient's Past Medical Hx, Surgical Hx, Family Hx, Social Hx, medications and allergies.   Physical Exam  Patient Vitals for the past 24 hrs:  BP Temp Pulse Resp SpO2  04/24/24 2325 110/73 98.5 F (36.9  C) 96 20 98 %   Constitutional: Well-developed, well-nourished female in no acute distress. Sleeping. Cardiovascular: normal rate Respiratory: normal effort GI: Abd soft, non-tender. Pos BS x 4 MS: Extremities nontender, no edema, normal ROM Neurologic: Alert and oriented x 4. No clonus GU: Neg CVAT.  Doppler: 148  LAB RESULTS Results for orders placed or performed during the hospital encounter of 04/24/24 (from the past 24 hours)  Urinalysis, Routine w reflex microscopic -Urine, Clean Catch     Status: Abnormal   Collection Time: 04/24/24 11:21 PM  Result Value Ref Range   Color, Urine YELLOW YELLOW   APPearance HAZY (A) CLEAR   Specific Gravity, Urine 1.012 1.005 - 1.030   pH 7.0 5.0 - 8.0   Glucose, UA NEGATIVE NEGATIVE mg/dL   Hgb  urine dipstick NEGATIVE NEGATIVE   Bilirubin Urine NEGATIVE NEGATIVE   Ketones, ur NEGATIVE NEGATIVE mg/dL   Protein, ur NEGATIVE NEGATIVE mg/dL   Nitrite NEGATIVE NEGATIVE   Leukocytes,Ua TRACE (A) NEGATIVE   RBC / HPF 0-5 0 - 5 RBC/hpf   WBC, UA 0-5 0 - 5 WBC/hpf   Bacteria, UA RARE (A) NONE SEEN   Squamous Epithelial / HPF 11-20 0 - 5 /HPF   Mucus PRESENT        IMAGING US  OB Comp Less 14 Wks Result Date: 04/05/2024 CLINICAL DATA:  Vaginal bleeding during pregnancy EXAM: OBSTETRIC <14 WK ULTRASOUND TECHNIQUE: Transabdominal ultrasound was performed for evaluation of the gestation as well as the maternal uterus and adnexal regions. COMPARISON:  None Available. FINDINGS: Intrauterine gestational sac: Single Yolk sac:  Visualized. Embryo:  Visualized. Cardiac Activity: Visualized. Heart Rate: 167 bpm CRL:   55 mm   12 w 1 d                  US  EDC: 10/17/2024 Subchorionic hemorrhage:  None visualized. Maternal uterus/adnexae: Bilateral ovaries are visualized and appear within normal limits. There is no pelvic free fluid. IMPRESSION: 1. Single live intrauterine gestation measuring 12 weeks 1 day by crown-rump length. Electronically Signed   By: Greig Pique M.D.   On: 04/05/2024 23:30    MAU Management/MDM: Orders Placed This Encounter  Procedures   Urinalysis, Routine w reflex microscopic -Urine, Clean Catch   Discharge patient Discharge disposition: 01-Home or Self Care; Discharge patient date: 04/25/2024    Meds ordered this encounter  Medications   acetaminophen -caffeine  (EXCEDRIN TENSION HEADACHE) 500-65 MG per tablet 2 tablet   DISCONTD: cyclobenzaprine  (FLEXERIL ) tablet 5 mg   acetaminophen -caffeine  (EXCEDRIN TENSION HEADACHE) 500-65 MG TABS per tablet    Sig: Take 1 tablet by mouth every 6 (six) hours as needed. For headache.    Dispense:  60 tablet    Refill:  0   ASSESSMENT 1. [redacted] weeks gestation of pregnancy   2. Acute nonintractable headache, unspecified headache type   3. Orthostatic lightheadedness   Normotensive on arrival. Dull frontal headache without red flag symptoms.   Trial Excedrin Tension with resolution.   PLAN Discharge home with strict return precautions. Continue routine prenatal care as scheduled. Provided taxi voucher  Allergies as of 04/25/2024       Reactions   Apple Juice Anaphylaxis   Other Anaphylaxis, Other (See Comments)   SALMON   Watermelon Flavoring Agent (non-screening) Anaphylaxis        Medication List     STOP taking these medications    cyclobenzaprine  5 MG tablet Commonly known as: FLEXERIL    methocarbamol  500 MG tablet Commonly known as: ROBAXIN    norethindrone  0.35 MG tablet Commonly known as: MICRONOR    pantoprazole  40 MG tablet Commonly known as: PROTONIX        TAKE these medications    Abilify  Maintena 400 MG Prsy prefilled syringe Generic drug: ARIPiprazole  ER Inject 400 mg into the muscle every 28 (twenty-eight) days.   acetaminophen -caffeine  500-65 MG Tabs per tablet Commonly known as: EXCEDRIN TENSION HEADACHE Take 1 tablet by mouth every 6 (six) hours as needed. For headache.   aspirin  EC 81 MG tablet Take 1 tablet (81 mg total) by  mouth daily. Swallow whole.   Blood Pressure Kit Devi 1 kit by Does not apply route as needed.   cefixime  400 MG Caps capsule Commonly known as: SUPRAX  Take 2 capsules (800 mg total) by mouth  daily. Partners will also need treatment.   famotidine  40 MG tablet Commonly known as: PEPCID  Take 1 tablet (40 mg total) by mouth daily.   folic acid  1 MG tablet Commonly known as: FOLVITE  Take 1 tablet (1 mg total) by mouth daily.   lamoTRIgine  25 MG tablet Commonly known as: LAMICTAL  Take 2 tablets (50 mg total) by mouth daily.   sertraline  100 MG tablet Commonly known as: ZOLOFT  Take 1 tablet (100 mg total) by mouth at bedtime.   traZODone  100 MG tablet Commonly known as: DESYREL  Take 1 tablet (100 mg total) by mouth at bedtime.   valACYclovir  500 MG tablet Commonly known as: VALTREX  Take 1 tablet (500 mg total) by mouth daily.        Follow-up Information     [facility] for [facility] [place]. Go to.   Specialty: Obstetrics and Gynecology Why: Continue scheduled prenatal care Contact information: [address], [address] [place]  [address] [phone]                Mardy Shropshire, MD FMOB Fellow, Faculty practice [facility], Center for [facility]  04/25/2024  1:40 AM

## 2024-04-24 NOTE — MAU Note (Signed)
 Nicole [name] is a 33 y.o. at [redacted]w[redacted]d here in MAU reporting: here by EMS - HA all day and dizziness. Took aspirin  before calling EMS. Reports getting SOB when gets up and moves.   LMP: NA Onset of complaint: all day Pain score: 9 Vitals:   04/24/24 2325  BP: 110/73  Pulse: 96  Resp: 20  Temp: 98.5 F (36.9 C)  SpO2: 98%     FHT: 148  Lab orders placed from triage: UA

## 2024-04-25 DIAGNOSIS — O26892 Other specified pregnancy related conditions, second trimester: Secondary | ICD-10-CM | POA: Diagnosis not present

## 2024-04-25 DIAGNOSIS — Z3A14 14 weeks gestation of pregnancy: Secondary | ICD-10-CM

## 2024-04-25 DIAGNOSIS — R519 Headache, unspecified: Secondary | ICD-10-CM

## 2024-04-25 LAB — URINALYSIS, ROUTINE W REFLEX MICROSCOPIC
Bilirubin Urine: NEGATIVE
Glucose, UA: NEGATIVE mg/dL
Hgb urine dipstick: NEGATIVE
Ketones, ur: NEGATIVE mg/dL
Nitrite: NEGATIVE
Protein, ur: NEGATIVE mg/dL
Specific Gravity, Urine: 1.012 (ref 1.005–1.030)
pH: 7 (ref 5.0–8.0)

## 2024-04-25 MED ORDER — CYCLOBENZAPRINE HCL 5 MG PO TABS: 5.0000 mg | ORAL_TABLET | Freq: Three times a day (TID) | ORAL | Status: DC | PRN

## 2024-04-25 MED ORDER — ACETAMINOPHEN-CAFFEINE 500-65 MG PO TABS: 1.0000 | ORAL_TABLET | Freq: Four times a day (QID) | ORAL | 0 refills | Status: DC | PRN

## 2024-04-25 MED ORDER — ACETAMINOPHEN-CAFFEINE 500-65 MG PO TABS
2.0000 | ORAL_TABLET | Freq: Once | ORAL | Status: AC
  Administered 2024-04-25: 2 via ORAL
  Filled 2024-04-25: qty 2

## 2024-04-25 NOTE — MAU Note (Signed)
 Pt states she does not have a ride home. Taxi voucher received from [facility]. RN called [employer] - no taxis available at this time, but they will call once one becomes available. Pt states she is willing to wait.

## 2024-04-25 NOTE — MAU Note (Signed)
 04/25/2024  Nicole [name] DOB: October 04, 1991 MRN: [id number]   RIDER WAIVER AND RELEASE OF LIABILITY  For the purposes of helping with transportation needs, [place] [facility] partners with outside transportation providers (taxi companies, [place], Catering manager.) to give [facility] patients or other approved people the choice of on-demand rides Public librarian) to our buildings for non-emergency visits.  By using [employer], I, the person signing this document, on behalf of myself and/or any legal minors (in my care using the [employer]), agree:  Science writer given to me are supplied by independent, outside transportation providers who do not work for, or have any affiliation with, [employer]. [place] is not a transportation company. [facility] has no control over the quality or safety of the rides I get using [employer]. [place] has no control over whether any outside ride will happen on time or not. [facility] gives no guarantee on the reliability, quality, safety, or availability on any rides, or that no mistakes will happen. I know and accept that traveling by vehicle (car, truck, SVU, fleeta, bus, taxi, etc.) has risks of serious injuries such as disability, being paralyzed, and death. I know and agree the risk of using [employer] is mine alone, and not [employer]. [employer] are provided as is and as are available. The transportation providers are in charge for all inspections and care of the vehicles used to provide these rides. I agree not to take legal action against [facility], its agents, employees, officers, directors, representatives, insurers, attorneys, assigns, successors, subsidiaries, and affiliates at any time for any reasons related directly or indirectly to using [employer]. I also agree not to take legal action against [facility] or its affiliates for any injury, death, or damage to property caused by or related to using  [employer]. I have read this Waiver and Release of Liability, and I understand the terms used in it and their legal meaning. This Waiver is freely and voluntarily given with the understanding that my right (or any legal minors) to legal action against  relating to [employer] is knowingly given up to use these services.   I attest that I read the Ride Waiver and Release of Liability to Nicole [name], gave Ms. Majchrzak the opportunity to ask questions and answered the questions asked (if any). I affirm that Nicole [name] then provided consent for assistance with transportation.

## 2024-04-29 LAB — AMB RESULTS CONSOLE CBG: Glucose: 102

## 2024-04-29 NOTE — Progress Notes (Signed)
 Declined sdoh

## 2024-05-04 ENCOUNTER — Ambulatory Visit (INDEPENDENT_AMBULATORY_CARE_PROVIDER_SITE_OTHER): Admitting: Advanced Practice Midwife

## 2024-05-04 VITALS — BP 114/84 | HR 82 | Wt 199.4 lb

## 2024-05-04 DIAGNOSIS — F5105 Insomnia due to other mental disorder: Secondary | ICD-10-CM

## 2024-05-04 DIAGNOSIS — O99342 Other mental disorders complicating pregnancy, second trimester: Secondary | ICD-10-CM

## 2024-05-04 DIAGNOSIS — Z3A16 16 weeks gestation of pregnancy: Secondary | ICD-10-CM

## 2024-05-04 DIAGNOSIS — O99352 Diseases of the nervous system complicating pregnancy, second trimester: Secondary | ICD-10-CM

## 2024-05-04 DIAGNOSIS — F250 Schizoaffective disorder, bipolar type: Secondary | ICD-10-CM | POA: Diagnosis not present

## 2024-05-04 DIAGNOSIS — O0990 Supervision of high risk pregnancy, unspecified, unspecified trimester: Secondary | ICD-10-CM

## 2024-05-04 MED ORDER — QUETIAPINE FUMARATE 200 MG PO TABS: 200.0000 mg | ORAL_TABLET | Freq: Every day | ORAL | 2 refills | Status: DC

## 2024-05-04 NOTE — Progress Notes (Signed)
 Pt presents for rob. Pt has no questions or concerns at this time.

## 2024-05-04 NOTE — Progress Notes (Signed)
   PRENATAL VISIT NOTE  Subjective:  Nicole [name] is a 33 y.o. G2P1001 at [redacted]w[redacted]d being seen today for ongoing prenatal care.  She is currently monitored for the following issues for this low-risk pregnancy and has Schizophrenia, unspecified (HCC); MDD (major depressive disorder), recurrent episode, moderate (HCC); Hypothyroidism; Tobacco use disorder; Borderline intellectual functioning; FAS (fetal alcohol syndrome); Anxiety state; Insomnia due to other mental disorder; Schizoaffective disorder, bipolar type (HCC); Essential hypertension; Type 2 diabetes mellitus (HCC); and Supervision of high risk pregnancy, antepartum on their problem list.  Patient reports difficulty sleeping and wants to change medications prescribed by behavioral health.  Contractions: Not present. Vag. Bleeding: None.  Movement: Present. Denies leaking of fluid.   The following portions of the patient's history were reviewed and updated as appropriate: allergies, current medications, past family history, past medical history, past social history, past surgical history and problem list.   Objective:    Vitals:   05/04/24 1045  BP: 114/84  Pulse: 82  Weight: 199 lb 6.4 oz (90.4 kg)    Fetal Status:    Fundal Height: 140 cm Movement: Present    General: Alert, oriented and cooperative. Patient is in no acute distress.  Skin: Skin is warm and dry. No rash noted.   Cardiovascular: Normal heart rate noted  Respiratory: Normal respiratory effort, no problems with respiration noted  Abdomen: Soft, gravid, appropriate for gestational age.  Pain/Pressure: Present     Pelvic: Cervical exam deferred        Extremities: Normal range of motion.  Edema: None  Mental Status: Normal mood and affect. Normal behavior. Normal judgment and thought content.   Assessment and Plan:  Pregnancy: G2P1001 at [redacted]w[redacted]d 1. Supervision of high risk pregnancy, antepartum (Primary) --Anticipatory guidance about next visits/weeks of pregnancy  given.  - AFP, Serum, Open Spina Bifida  2. Schizoaffective disorder, bipolar type (HCC)   3. Insomnia due to other mental disorder --Reviewed pt medications. Pt reports she was taking Seroquel  before pregnancy and that helped with sleep.   --With low risks, benefit likely outweighs risks --Rx for Seroquel  sent for low dose at bedtime  4. [redacted] weeks gestation of pregnancy    Preterm labor symptoms and general obstetric precautions including but not limited to vaginal bleeding, contractions, leaking of fluid and fetal movement were reviewed in detail with the patient. Please refer to After Visit Summary for other counseling recommendations.   Return in about 4 weeks (around 06/01/2024) for LOB, Midwife preferred.  Future Appointments  Date Time Provider Department Center  05/11/2024 10:00 AM [facility] NURSE [facility] None  05/27/2024 10:00 AM [facility] PROVIDER 1 [facility]  05/27/2024 10:30 AM [facility] US5 [facility]  06/01/2024  9:15 AM Leftwich-Kirby, Olam LABOR, CNM [facility] None  06/18/2024  9:00 AM Starkes-Perry, Majel RAMAN, FNP [facility] None    Olam Boards, CNM

## 2024-05-06 ENCOUNTER — Inpatient Hospital Stay (HOSPITAL_COMMUNITY)
Admission: AD | Admit: 2024-05-06 | Discharge: 2024-05-06 | Discharge status: Against Medical Advice | Payer: Self-pay | Attending: Obstetrics & Gynecology | Admitting: Obstetrics & Gynecology

## 2024-05-06 ENCOUNTER — Other Ambulatory Visit: Payer: Self-pay

## 2024-05-06 DIAGNOSIS — Z87891 Personal history of nicotine dependence: Secondary | ICD-10-CM | POA: Diagnosis not present

## 2024-05-06 DIAGNOSIS — Z79899 Other long term (current) drug therapy: Secondary | ICD-10-CM | POA: Insufficient documentation

## 2024-05-06 DIAGNOSIS — F99 Mental disorder, not otherwise specified: Secondary | ICD-10-CM | POA: Diagnosis not present

## 2024-05-06 DIAGNOSIS — G4730 Sleep apnea, unspecified: Secondary | ICD-10-CM | POA: Diagnosis present

## 2024-05-06 DIAGNOSIS — O99352 Diseases of the nervous system complicating pregnancy, second trimester: Secondary | ICD-10-CM | POA: Insufficient documentation

## 2024-05-06 DIAGNOSIS — F5105 Insomnia due to other mental disorder: Secondary | ICD-10-CM

## 2024-05-06 DIAGNOSIS — O99342 Other mental disorders complicating pregnancy, second trimester: Secondary | ICD-10-CM | POA: Insufficient documentation

## 2024-05-06 DIAGNOSIS — Z3A16 16 weeks gestation of pregnancy: Secondary | ICD-10-CM | POA: Diagnosis not present

## 2024-05-06 DIAGNOSIS — R1031 Right lower quadrant pain: Secondary | ICD-10-CM | POA: Diagnosis present

## 2024-05-06 DIAGNOSIS — F250 Schizoaffective disorder, bipolar type: Secondary | ICD-10-CM | POA: Insufficient documentation

## 2024-05-06 DIAGNOSIS — G4733 Obstructive sleep apnea (adult) (pediatric): Secondary | ICD-10-CM | POA: Diagnosis not present

## 2024-05-06 LAB — URINALYSIS, ROUTINE W REFLEX MICROSCOPIC
Bilirubin Urine: NEGATIVE
Glucose, UA: NEGATIVE mg/dL
Hgb urine dipstick: NEGATIVE
Ketones, ur: NEGATIVE mg/dL
Leukocytes,Ua: NEGATIVE
Nitrite: NEGATIVE
Protein, ur: NEGATIVE mg/dL
Specific Gravity, Urine: 1.005 (ref 1.005–1.030)
pH: 7 (ref 5.0–8.0)

## 2024-05-06 LAB — AFP, SERUM, OPEN SPINA BIFIDA
AFP MoM: 1.64
AFP Value: 50 ng/mL
Gest. Age on Collection Date: 16 wk
Maternal Age At EDD: 31.1 a
OSBR Risk 1 IN: 3791
Test Results:: NEGATIVE
Weight: 199 [lb_av]

## 2024-05-06 MED ORDER — QUETIAPINE FUMARATE 50 MG PO TABS: 50.0000 mg | ORAL_TABLET | Freq: Every day | ORAL | 1 refills | Status: DC

## 2024-05-06 NOTE — MAU Provider Note (Signed)
 History     CSN: [id number]  Arrival date and time: 05/06/24 1129   Event Date/Time   First Provider Initiated Contact with Patient 05/06/24 1257      Chief Complaint  Patient presents with   Abdominal Pain   Breathing Issues   HPI  Nicole [name] is a 33 y.o. female G2P1001 @ 101w2d here with breathing concerns. Of note she has a significant mental health history and is on multiple high risk meds. She does attest to a history of sleep apnea, and her sleep machine is without parts currently. She has not used her machine in 6 months. She reports She has periods where she feels periods of apnea and wakes up with headaches. She has no chest pain, or SOB currently, no cardiac or breathing complaints at this time.   Reports taking Trazadone every night for sleep. Has never been told this is not safe to use for pregnancy.   Has pyschiatrist; see's them once per month. Recently restarted Seroquel  by OB.   Med list updated: patient is currently taking:  -Abilify  400 mg injection every month -Seroquel - Restarted 2 days ago at 200 mg at night. Previously on 400 mg BID -Lamictal - 50 mg daily  -Zoloft  - 100 mg daily  -Trazodone - 100 mg daily at bedtime   OB History     Gravida  2   Para  1   Term  1   Preterm  0   AB  0   Living  1      SAB  0   IAB  0   Ectopic  0   Multiple  0   Live Births  0           Past Medical History:  Diagnosis Date   Anxiety    Depression    Hypothyroidism 08/07/2022   Tobacco use disorder 08/07/2022    Past Surgical History:  Procedure Laterality Date   WISDOM TOOTH EXTRACTION Bilateral 2020    Family History  Problem Relation Age of Onset   Hypertension Father    Diabetes Father     Social History   Tobacco Use   Smoking status: Every Day    Current packs/day: 0.00    Types: Cigarettes    Last attempt to quit: 02/17/2024    Years since quitting: 0.2   Smokeless tobacco: Never  Vaping Use   Vaping status:  Former   Quit date: 01/13/2024  Substance Use Topics   Alcohol use: Not Currently    Comment: weekly   Drug use: Not Currently    Types: Marijuana, Other-see comments    Comment: Xanax    Allergies:  Allergies  Allergen Reactions   Apple Juice Anaphylaxis   Other Anaphylaxis and Other (See Comments)    SALMON   Watermelon Flavoring Agent (Non-Screening) Anaphylaxis    Facility-Administered Medications Prior to Admission  Medication Dose Route Frequency Provider Last Rate Last Admin   ARIPiprazole  ER (ABILIFY  MAINTENA) 400 MG prefilled syringe 400 mg  400 mg Intramuscular Q28 days    400 mg at 04/13/24 1007   Medications Prior to Admission  Medication Sig Dispense Refill Last Dose/Taking   acetaminophen -caffeine  (EXCEDRIN TENSION HEADACHE) 500-65 MG TABS per tablet Take 1 tablet by mouth every 6 (six) hours as needed. For headache. 60 tablet 0    ARIPiprazole  ER (ABILIFY  MAINTENA) 400 MG PRSY prefilled syringe Inject 400 mg into the muscle every 28 (twenty-eight) days. 1 each 11    aspirin  EC  81 MG tablet Take 1 tablet (81 mg total) by mouth daily. Swallow whole. 90 tablet 5    Blood Pressure Monitoring (BLOOD PRESSURE KIT) DEVI 1 kit by Does not apply route as needed. (Patient not taking: Reported on 05/04/2024) 1 each 0    cefixime  (SUPRAX ) 400 MG CAPS capsule Take 2 capsules (800 mg total) by mouth daily. Partners will also need treatment. (Patient not taking: Reported on 05/04/2024) 2 capsule 0    famotidine  (PEPCID ) 40 MG tablet Take 1 tablet (40 mg total) by mouth daily. 90 tablet 3    folic acid  (FOLVITE ) 1 MG tablet Take 1 tablet (1 mg total) by mouth daily. (Patient not taking: Reported on 05/04/2024) 30 tablet 10    lamoTRIgine  (LAMICTAL ) 25 MG tablet Take 2 tablets (50 mg total) by mouth daily. 60 tablet 3    QUEtiapine  (SEROQUEL ) 200 MG tablet Take 1 tablet (200 mg total) by mouth at bedtime. 30 tablet 2    sertraline  (ZOLOFT ) 100 MG tablet Take 1 tablet (100 mg total) by  mouth at bedtime. 30 tablet 3    traZODone  (DESYREL ) 100 MG tablet Take 1 tablet (100 mg total) by mouth at bedtime. 30 tablet 3    valACYclovir  (VALTREX ) 500 MG tablet Take 1 tablet (500 mg total) by mouth daily. 30 tablet 8    Results for orders placed or performed during the hospital encounter of 05/06/24 (from the past 24 hours)  Urinalysis, Routine w reflex microscopic -Urine, Clean Catch     Status: Abnormal   Collection Time: 05/06/24 12:51 PM  Result Value Ref Range   Color, Urine YELLOW YELLOW   APPearance HAZY (A) CLEAR   Specific Gravity, Urine 1.005 1.005 - 1.030   pH 7.0 5.0 - 8.0   Glucose, UA NEGATIVE NEGATIVE mg/dL   Hgb urine dipstick NEGATIVE NEGATIVE   Bilirubin Urine NEGATIVE NEGATIVE   Ketones, ur NEGATIVE NEGATIVE mg/dL   Protein, ur NEGATIVE NEGATIVE mg/dL   Nitrite NEGATIVE NEGATIVE   Leukocytes,Ua NEGATIVE NEGATIVE     Review of Systems  All other systems reviewed and are negative.  Physical Exam   Blood pressure 104/70, pulse 87, temperature 98.2 F (36.8 C), temperature source Oral, resp. rate 19, height 5' 1 (1.549 m), weight 91.2 kg, last menstrual period 01/13/2024, SpO2 100%.  Physical Exam Constitutional:      General: She is not in acute distress.    Appearance: Normal appearance. She is not ill-appearing, toxic-appearing or diaphoretic.  Pulmonary:     Effort: Pulmonary effort is normal.  Skin:    General: Skin is warm.  Neurological:     Mental Status: She is alert and oriented to person, place, and time.    MAU Course  Procedures None  MDM  EKG normal Discussed patient with on-Call symptom wide psych team Jerel gravely to review psych medications. It was recommended that her Seroquel  dose get decreased to 50 mg before bed. Seroquel  Is a medication that should be slowly titrated up and 200 mg at bedtime could be causing excessive sleepiness in addition to trazodone  and Abilify . Given that the Seroquel  was just started 2 days this is  likely contributing.   She should have close follow up with her psych providers- message sent through epic.   Assessment and Plan   A:  1. Insomnia due to other mental disorder   2. Schizoaffective disorder, bipolar type (HCC)   3. Obstructive sleep apnea syndrome      P:  Close  follow up with Pysch Recommend contacting sleep supply office to obtain new supplies. Also schedule f/u with sleep providers Decrease Seroquil to 50 mg PO at night per psych recommendations Stop Trazodone  , this is not a recommended treatment option for insomnia in pregnancy. Return to MAU if symptoms worsen   Nicole [name], Delon FERNS, NP 05/06/2024 5:05 PM

## 2024-05-06 NOTE — MAU Note (Signed)
 Nicole [name] is a 33 y.o. at [redacted]w[redacted]d here in MAU reporting: she is having breathing issues that are worsening  stops breathing when she goes to sleep.  Denies VB or LOF.  Reports sharp constant pain in RLQ for past few days.  Reports has taken Aspirin  for the pain, no relief noted, last taken last night.  LMP: 01/13/2024 Onset of complaint: 2 days ago Pain score: 7 Vitals:   05/06/24 1247  BP: 104/70  Pulse: 87  Resp: 19  Temp: 98.2 F (36.8 C)  SpO2: 100%     FHT: 163 bpm  Lab orders placed from triage: UA

## 2024-05-11 ENCOUNTER — Encounter (HOSPITAL_COMMUNITY): Payer: Self-pay

## 2024-05-11 ENCOUNTER — Ambulatory Visit (HOSPITAL_COMMUNITY)

## 2024-05-17 DIAGNOSIS — O99210 Obesity complicating pregnancy, unspecified trimester: Secondary | ICD-10-CM | POA: Insufficient documentation

## 2024-05-19 ENCOUNTER — Telehealth (HOSPITAL_COMMUNITY): Payer: Self-pay

## 2024-05-19 NOTE — Telephone Encounter (Signed)
 Hello,   Patient is pregnant ATM, called to get her rescheduled for an injection APP and Doc's Visit, pt states that her OB/GYN tapered her down on her Seroquel , however patient states that this has been causing disruptions in her mood and quality of life for her carrying the baby. I am not sure who to route this too as she canceled with Dr. Loyal it seems, but she was previously Dr.Parsons Patient.   Please advise

## 2024-05-21 ENCOUNTER — Telehealth (HOSPITAL_COMMUNITY): Admitting: Family

## 2024-05-21 ENCOUNTER — Encounter (HOSPITAL_COMMUNITY): Payer: Self-pay | Admitting: Family

## 2024-05-21 DIAGNOSIS — G479 Sleep disorder, unspecified: Secondary | ICD-10-CM

## 2024-05-21 DIAGNOSIS — O99345 Other mental disorders complicating the puerperium: Secondary | ICD-10-CM | POA: Diagnosis not present

## 2024-05-21 DIAGNOSIS — O99340 Other mental disorders complicating pregnancy, unspecified trimester: Secondary | ICD-10-CM | POA: Diagnosis not present

## 2024-05-21 DIAGNOSIS — Z3A00 Weeks of gestation of pregnancy not specified: Secondary | ICD-10-CM

## 2024-05-21 DIAGNOSIS — F250 Schizoaffective disorder, bipolar type: Secondary | ICD-10-CM

## 2024-05-21 NOTE — Progress Notes (Addendum)
 Virtual Visit via Video Note  I connected with Nicole [name] on 06/21/24 at 10:30 AM EDT by a video enabled telemedicine application and verified that I am speaking with the correct person using two identifiers.  Location: Patient: Home Provider: [facility] Office   I discussed the limitations of evaluation and management by telemedicine and the availability of in person appointments. The patient expressed understanding and agreed to proceed.    I discussed the assessment and treatment plan with the patient. The patient was provided an opportunity to ask questions and all were answered. The patient agreed with the plan and demonstrated an understanding of the instructions.   The patient was advised to call back or seek an in-person evaluation if the symptoms worsen or if the condition fails to improve as anticipated.  I provided 45 minutes of non-face-to-face time during this encounter.   Majel GORMAN Ramp, FNP  BH MD/PA/NP OP Progress Note    05/21/2024 11:47 AM Nicole [name]  MRN:  [id number]  Chief Complaint: I am pregnant and concerned about being on medications  HPI: 33 year old female seen today for follow-up psychiatric evaluation.  She has a psychiatric history of schizophrenia, depression, anxiety, insomnia, tobacco use, and borderline intellectual functioning. Patients medications were adjusted and she is currently managed on Abilify  maintainer 400 mg every 28 days,trazodone  100 mg nightly as needed, Seroquel  50 mg twice daily, Zoloft  100 mg daily, and Lamictal  50 mg daily.  She inform her that her medications are somewhat effective in managing her psychiatric condition.  Patient presents well-groomed, pleasant, cooperative, and engaged in conversation. She reports being pregnant and expresses concern regarding her current medication regimen. Her last long-acting antipsychotic injection was administered by her boyfriend on 05/11/24. She continues to take Seroquel  50mg  twice  daily, noting it helps with sleep, while Abilify  improves her overall compliance.  Since her last visit, she reports ongoing improvement in most symptoms, with the exception of persistent visual hallucinations. Although she states they are not distressing, she continues to experience the feeling that others are "out to get her," which she finds bothersome. At the time of evaluation, she was at her day program ([facility]). Discussion was held regarding her being on two antipsychotics in addition to a mood stabilizer. It was explained that if psychotic symptoms persist with or without medication, behavioral strategies, coping skills, and therapeutic interventions will be essential. She verbalized understanding and allowed communication with Carlin, a staff member, who confirmed her qualified professional is Agricultural consultant and will relay the recommendations. Although she endorses VH she does not appear to be responding to internal stimuli or external stimuli. She is very appropriate and tolerating these symptoms well.   She expresses anxiety about her mental health, the safety of her unborn child, and her 34-year-old daughter. She denies suicidal ideation, homicidal ideation, paranoia, tobacco use, or illicit drug use. She reports eating and sleeping adequately, though she wakes 2-3 times nightly to use the restroom, which she attributes to pregnancy changes. She appears happy and excited about her pregnancy and denies pregnancy-related stressors. No other concerns noted at this time.  Visit Diagnosis:    ICD-10-CM   1. Schizoaffective disorder, bipolar type (HCC) [F25.0]  F25.0       Past Psychiatric History: Schizophrenia (unspecified type), Major depressive disorder, Patient reports that she has been hospitalized due to mental health multiple times.  Patient states that she has done stints at [facility] and [facility]  Past Medical History:  Past Medical History:  Diagnosis  Date   Anxiety    Depression     Hypothyroidism 08/07/2022   Tobacco use disorder 08/07/2022    Past Surgical History:  Procedure Laterality Date   WISDOM TOOTH EXTRACTION Bilateral 2020    Family Psychiatric History: mother attempted suicide, Patient reports that her brother used to abuse marijuana and alcohol. Patient states that her mother used to abuse marijuana and alcohol.   Family History:  Family History  Problem Relation Age of Onset   Hypertension Father    Diabetes Father     Social History:  Social History   Socioeconomic History   Marital status: Single    Spouse name: Not on file   Number of children: Not on file   Years of education: Not on file   Highest education level: Not on file  Occupational History   Not on file  Tobacco Use   Smoking status: Every Day    Current packs/day: 0.00    Types: Cigarettes    Last attempt to quit: 02/17/2024    Years since quitting: 0.2   Smokeless tobacco: Never  Vaping Use   Vaping status: Former   Quit date: 01/13/2024  Substance and Sexual Activity   Alcohol use: Not Currently    Comment: weekly   Drug use: Not Currently    Types: Marijuana, Other-see comments    Comment: Xanax   Sexual activity: Yes    Partners: Female  Other Topics Concern   Not on file  Social History Narrative   Not on file   Social Drivers of Health   Financial Resource Strain: Low Risk  (12/13/2022)   Overall Financial Resource Strain (CARDIA)    Difficulty of Paying Living Expenses: Not hard at all  Food Insecurity: Patient Declined (04/29/2024)   Hunger Vital Sign    Worried About Running Out of Food in the Last Year: Patient declined    Ran Out of Food in the Last Year: Patient declined  Transportation Needs: Patient Declined (04/29/2024)   PRAPARE - Administrator, Civil Service (Medical): Patient declined    Lack of Transportation (Non-Medical): Patient declined  Physical Activity: Inactive (12/13/2022)   Exercise Vital Sign    Days of Exercise per  Week: 0 days    Minutes of Exercise per Session: 0 min  Stress: Stress Concern Present (12/13/2022)   [employer] of Occupational Health - Occupational Stress Questionnaire    Feeling of Stress : Very much  Social Connections: Socially Integrated (12/13/2022)   Social Connection and Isolation Panel    Frequency of Communication with Friends and Family: More than three times a week    Frequency of Social Gatherings with Friends and Family: Three times a week    Attends Religious Services: More than 4 times per year    Active Member of Clubs or Organizations: Yes    Attends Banker Meetings: More than 4 times per year    Marital Status: Living with partner    Allergies:  Allergies  Allergen Reactions   Apple Juice Anaphylaxis   Other Anaphylaxis and Other (See Comments)    SALMON   Watermelon Flavoring Agent (Non-Screening) Anaphylaxis    Metabolic Disorder Labs: Lab Results  Component Value Date   HGBA1C 5.4 04/06/2024   MPG 99.67 01/24/2024   MPG 117 10/15/2022   Lab Results  Component Value Date   PROLACTIN 2.1 (L) 01/24/2024   PROLACTIN 0.6 (L) 01/14/2024   Lab Results  Component Value Date  CHOL 165 01/24/2024   TRIG 49 01/24/2024   HDL 53 01/24/2024   CHOLHDL 3.1 01/24/2024   VLDL 10 01/24/2024   LDLCALC 102 (H) 01/24/2024   LDLCALC 112 (H) 07/25/2022   Lab Results  Component Value Date   TSH 2.550 01/24/2024   TSH 0.793 09/05/2022    Therapeutic Level Labs: No results found for: LITHIUM No results found for: VALPROATE No results found for: CBMZ  Current Medications: Current Outpatient Medications  Medication Sig Dispense Refill   acetaminophen -caffeine  (EXCEDRIN TENSION HEADACHE) 500-65 MG TABS per tablet Take 1 tablet by mouth every 6 (six) hours as needed. For headache. 60 tablet 0   ARIPiprazole  ER (ABILIFY  MAINTENA) 400 MG PRSY prefilled syringe Inject 400 mg into the muscle every 28 (twenty-eight) days. 1 each 11   aspirin   EC 81 MG tablet Take 1 tablet (81 mg total) by mouth daily. Swallow whole. 90 tablet 5   Blood Pressure Monitoring (BLOOD PRESSURE KIT) DEVI 1 kit by Does not apply route as needed. (Patient not taking: Reported on 05/04/2024) 1 each 0   famotidine  (PEPCID ) 40 MG tablet Take 1 tablet (40 mg total) by mouth daily. 90 tablet 3   lamoTRIgine  (LAMICTAL ) 25 MG tablet Take 2 tablets (50 mg total) by mouth daily. 60 tablet 3   QUEtiapine  (SEROQUEL ) 50 MG tablet Take 1 tablet (50 mg total) by mouth at bedtime. 60 tablet 1   sertraline  (ZOLOFT ) 100 MG tablet Take 1 tablet (100 mg total) by mouth at bedtime. 30 tablet 3   valACYclovir  (VALTREX ) 500 MG tablet Take 1 tablet (500 mg total) by mouth daily. 30 tablet 8   Current Facility-Administered Medications  Medication Dose Route Frequency Provider Last Rate Last Admin   ARIPiprazole  ER (ABILIFY  MAINTENA) 400 MG prefilled syringe 400 mg  400 mg Intramuscular Q28 days    400 mg at 04/13/24 1007     Musculoskeletal: Strength & Muscle Tone: within normal limits Gait & Station: normal Patient leans: N/A  Psychiatric Specialty Exam: Review of Systems  All other systems reviewed and are negative.   Last menstrual period 01/13/2024.There is no height or weight on file to calculate BMI.  General Appearance: Well Groomed  Eye Contact:  Good  Speech:  Clear and Coherent and Slow  Volume:  Normal  Mood:  Anxious, improving  Affect:  Appropriate and Congruent  Thought Process:  Coherent, Goal Directed, and Linear  Orientation:  Full (Time, Place, and Person)  Thought Content: Logical and Hallucinations: Visual   Suicidal Thoughts:  No  Homicidal Thoughts:  No  Memory:  Immediate;   Good Recent;   Good Remote;   Good  Judgement:  Good  Insight:  Good  Psychomotor Activity:  Normal  Concentration:  Concentration: Good and Attention Span: Good  Recall:  Good  Fund of Knowledge: Good  Language: Good  Akathisia:  No  Handed:  Right  AIMS (if  indicated):  done, 0  Assets:  Communication Skills Desire for Improvement Housing Leisure Time Physical Health Social Support  ADL's:  Intact  Cognition: WNL  Sleep:  Good   Screenings: AIMS    Flowsheet Row Office Visit from [facility]/16/2024 in [facility] Office Visit from 01/30/2023 in [facility]  AIMS Total Score 0 2   GAD-7    Flowsheet Row Initial Prenatal from 04/06/2024 in [facility] for [facility] [place] Office Visit from 03/16/2024 in [facility] Clinical Support  from 02/18/2024 in [facility] for [facility] at [place] Video Visit from 01/21/2024 in [facility] Video Visit from 11/07/2023 in [facility]  Total GAD-7 Score 7 14 0 17 21   PHQ2-9    Flowsheet Row Initial Prenatal from 04/06/2024 in [facility] for [facility] [place] Office Visit from 03/16/2024 in [facility] Clinical Support from 02/18/2024 in [facility] for [facility] at [place] ED from 01/24/2024 in [facility] Video Visit from 01/21/2024 in [facility]  PHQ-2 Total Score 0 0 0 2 2  PHQ-9 Total Score 0 3 0 9 14   Flowsheet Row Admission (Discharged) from 04/24/2024 in [facility] Office Visit from 03/16/2024 in [facility] UC from 02/17/2024 in [facility] RISK CATEGORY No Risk Error: Q3, 4, or 5 should not be populated when Q2 is No No Risk     Assessment and Plan: Schizoaffective disorder, bipolar type, with improvement in mood and irritability but persistent mild psychotic symptoms.  Currently pregnant; requires medication adjustments for fetal safety.  Anxiety related to health of self and unborn child.  Stable functioning at day program with  supports in place.  1. Schizoaffective disorder, bipolar type (HCC)  Continue- lamoTRIgine  (LAMICTAL ) 25 MG tablet; Take 2 tablets (50 mg total) by mouth daily.  Dispense: 60 tablet; Refill: 3 Continue- ARIPiprazole  ER (ABILIFY  MAINTENA) 400 MG PRSY prefilled syringe; Inject 400 mg into the muscle every 28 (twenty-eight) days.  Dispense: 1 each; Refill: 11 -Therapy for management of these psychotic symptoms 2. Anxiety state (Primary)  Continue- sertraline  (ZOLOFT ) 100 MG tablet; Take 1 tablet (100 mg total) by mouth at bedtime.  Dispense: 30 tablet; Refill: 3 Continue- traZODone  (DESYREL ) 100 MG tablet; Take 1 tablet (100 mg total) by mouth at bedtime.  Dispense: 30 tablet; Refill: 3  3. Sleep disturbance  Continue- traZODone  (DESYREL ) 100 MG tablet; Take 1 tablet (100 mg total) by mouth at bedtime.  Dispense: 30 tablet; Refill: 3  Collaboration of Care: Collaboration of Care: Other provider involved in patient's care AEB counselor, PCP, shock clinic staff  Patient/Guardian was advised Release of Information must be obtained prior to any record release in order to collaborate their care with an outside provider. Patient/Guardian was advised if they have not already done so to contact the registration department to sign all necessary forms in order for us  to release information regarding their care.   Consent: Patient/Guardian gives verbal consent for treatment and assignment of benefits for services provided during this visit. Patient/Guardian expressed understanding and agreed to proceed.   Follow-up in 2-1/2 months Follow-up with shot clinic in 1 month Majel GORMAN Ramp, FNP 05/21/2024, 11:47 AM

## 2024-05-27 ENCOUNTER — Ambulatory Visit (HOSPITAL_BASED_OUTPATIENT_CLINIC_OR_DEPARTMENT_OTHER)

## 2024-05-27 ENCOUNTER — Other Ambulatory Visit: Payer: Self-pay | Admitting: *Deleted

## 2024-05-27 ENCOUNTER — Encounter: Payer: Self-pay | Admitting: Obstetrics and Gynecology

## 2024-05-27 ENCOUNTER — Ambulatory Visit: Attending: Obstetrics and Gynecology | Admitting: Obstetrics and Gynecology

## 2024-05-27 VITALS — BP 117/65 | HR 87

## 2024-05-27 DIAGNOSIS — O09892 Supervision of other high risk pregnancies, second trimester: Secondary | ICD-10-CM | POA: Insufficient documentation

## 2024-05-27 DIAGNOSIS — O09292 Supervision of pregnancy with other poor reproductive or obstetric history, second trimester: Secondary | ICD-10-CM | POA: Diagnosis not present

## 2024-05-27 DIAGNOSIS — Z362 Encounter for other antenatal screening follow-up: Secondary | ICD-10-CM

## 2024-05-27 DIAGNOSIS — R946 Abnormal results of thyroid function studies: Secondary | ICD-10-CM | POA: Insufficient documentation

## 2024-05-27 DIAGNOSIS — O99210 Obesity complicating pregnancy, unspecified trimester: Secondary | ICD-10-CM

## 2024-05-27 DIAGNOSIS — F17210 Nicotine dependence, cigarettes, uncomplicated: Secondary | ICD-10-CM

## 2024-05-27 DIAGNOSIS — E669 Obesity, unspecified: Secondary | ICD-10-CM

## 2024-05-27 DIAGNOSIS — Z3A19 19 weeks gestation of pregnancy: Secondary | ICD-10-CM | POA: Diagnosis not present

## 2024-05-27 DIAGNOSIS — O99212 Obesity complicating pregnancy, second trimester: Secondary | ICD-10-CM

## 2024-05-27 DIAGNOSIS — O99322 Drug use complicating pregnancy, second trimester: Secondary | ICD-10-CM | POA: Insufficient documentation

## 2024-05-27 DIAGNOSIS — O99282 Endocrine, nutritional and metabolic diseases complicating pregnancy, second trimester: Secondary | ICD-10-CM

## 2024-05-27 DIAGNOSIS — O0990 Supervision of high risk pregnancy, unspecified, unspecified trimester: Secondary | ICD-10-CM

## 2024-05-27 DIAGNOSIS — F1990 Other psychoactive substance use, unspecified, uncomplicated: Secondary | ICD-10-CM | POA: Diagnosis not present

## 2024-05-27 DIAGNOSIS — O99342 Other mental disorders complicating pregnancy, second trimester: Secondary | ICD-10-CM | POA: Insufficient documentation

## 2024-05-27 DIAGNOSIS — E039 Hypothyroidism, unspecified: Secondary | ICD-10-CM | POA: Insufficient documentation

## 2024-05-27 DIAGNOSIS — O99332 Smoking (tobacco) complicating pregnancy, second trimester: Secondary | ICD-10-CM

## 2024-05-27 DIAGNOSIS — O09299 Supervision of pregnancy with other poor reproductive or obstetric history, unspecified trimester: Secondary | ICD-10-CM | POA: Insufficient documentation

## 2024-05-27 DIAGNOSIS — F319 Bipolar disorder, unspecified: Secondary | ICD-10-CM | POA: Insufficient documentation

## 2024-05-27 DIAGNOSIS — O99330 Smoking (tobacco) complicating pregnancy, unspecified trimester: Secondary | ICD-10-CM | POA: Insufficient documentation

## 2024-05-27 NOTE — Progress Notes (Signed)
 Maternal-Fetal Medicine Consultation Name: Nicole [name] MRN: [id number]  G2 P1001 at 19w 2d gestation. Patient is here for fetal anatomy scan. On cell-free fetal DNA screening, the risks of aneuploidies are not increased. MSAFP screening showed low risk for open-neural tube defects. -Bipolar disorder.  The patient takes Seroquel , Abilify  and Lamictal . She reports she does not have type 2 diabetes.  Recent hemoglobin A1c was 5.4%.  No history of hypertension. Patient reports she smokes 4 to 5 cigarettes daily. She has hypothyroidism and recent TSH levels assessed 4 months ago was normal.  Patient had discontinued levothyroxine  (?).  She is planning to resume levothyroxine .  Ultrasound We performed fetal anatomy scan. No makers of aneuploidies or fetal structural defects are seen.  Fetal face could not be evaluated because of fetal position.  Fetal biometry is consistent with her previously-established dates. Amniotic fluid is normal and good fetal activity is seen. Patient understands the limitations of ultrasound in detecting fetal anomalies.  Our concerns include: Medications Quapetine (Seroquel ) It is an antipsychotic medication. Not adequate information is available on the fetal effects because of fewer number of patients exposed to this drug. No increased congenital malformations have been reported. However, if maternal benefit is clearly seen, the drug should be continued in pregnancy. This drug is excreted in breast milk but only about 0.43% of maternal dose was seen. Breastfeeding while on this medication seems safe.  Abililfy (aripiprazole ) It is an antipsychotic medication It has a longer half-life as it is mostly protein bound. It is likely that this drug crosses the placenta. One prospective observational cohort study showed no increase in major congenital malformations in the fetus. Higher rate of fetal growth restriction was seen in women taking this medication. Use of this medication  in the third trimester is likely to increase the risk of extrapyramidal or withdrawal symptoms in the newborn.  Lamotrigine  It is an anticonvulsant but also used with the treatment of bipolar disorder.  No increased congenital malformations have been reported.  However, upon review showed a small increase in the incidence of cleft lip and palate (0.1% to 0.4%), but the absolute risk is very small.  I reassured the patient of normal facial anatomy.  Tobacco use in pregnancy Cigarette smoking increases the risk of maternal complications that include placental abruption and placenta previa. Fetal complications include fetal growth restriction, prematurity and stillbirth. Smoking cessation at any gestational age is beneficial and should routinely be addressed in her prenatal visits. Nicotine -replacement therapy (NRT) can be considered if nonpharmacological approaches have failed. Its chief benefit is that it does not contain other toxic substances present in cigarettes. Cigarettes, in addition to nicotine  and Carbon monoxide, contain several other toxic substances. Transdermal patch can be considered in her case. Overall, the benefit of NRT outweighs cigarette smoking as cigarettes contain several other compounds including carcinogens that are harmful to her and her pregnancy.  Hypothyroidism TSH level was 6.68 a year ago.  I encouraged the patient to take levothyroxine  supplements.  Overt hypothyroidism increases the risk of preeclampsia.  Patient takes low-dose aspirin  prophylaxis.  I discussed the benefit of low-dose aspirin  prophylaxis that delays or prevents preeclampsia.  Recommendations - An appointment was made for her to return in 4 weeks for completion of fetal anatomy (including nose and lips). - Fetal growth assessments every 4 to 6 weeks. - Levothyroxine  supplements after repeat TSH levels.   Consultation including face-to-face (more than 50%) counseling 45 minutes.

## 2024-05-28 ENCOUNTER — Telehealth (HOSPITAL_COMMUNITY): Admitting: Family

## 2024-06-01 ENCOUNTER — Encounter: Admitting: Advanced Practice Midwife

## 2024-06-01 ENCOUNTER — Inpatient Hospital Stay (HOSPITAL_COMMUNITY)
Admission: AD | Admit: 2024-06-01 | Discharge: 2024-06-01 | Disposition: A | Discharge status: Home / Self Care | Attending: Obstetrics and Gynecology | Admitting: Obstetrics and Gynecology

## 2024-06-01 ENCOUNTER — Encounter (HOSPITAL_COMMUNITY): Payer: Self-pay | Admitting: Obstetrics and Gynecology

## 2024-06-01 ENCOUNTER — Other Ambulatory Visit: Payer: Self-pay

## 2024-06-01 DIAGNOSIS — Z3A20 20 weeks gestation of pregnancy: Secondary | ICD-10-CM

## 2024-06-01 DIAGNOSIS — O26892 Other specified pregnancy related conditions, second trimester: Secondary | ICD-10-CM | POA: Diagnosis not present

## 2024-06-01 DIAGNOSIS — O99282 Endocrine, nutritional and metabolic diseases complicating pregnancy, second trimester: Secondary | ICD-10-CM | POA: Insufficient documentation

## 2024-06-01 DIAGNOSIS — Z3492 Encounter for supervision of normal pregnancy, unspecified, second trimester: Secondary | ICD-10-CM

## 2024-06-01 DIAGNOSIS — O0992 Supervision of high risk pregnancy, unspecified, second trimester: Secondary | ICD-10-CM | POA: Insufficient documentation

## 2024-06-01 DIAGNOSIS — Z3A19 19 weeks gestation of pregnancy: Secondary | ICD-10-CM

## 2024-06-01 DIAGNOSIS — E860 Dehydration: Secondary | ICD-10-CM

## 2024-06-01 DIAGNOSIS — F250 Schizoaffective disorder, bipolar type: Secondary | ICD-10-CM

## 2024-06-01 DIAGNOSIS — O0990 Supervision of high risk pregnancy, unspecified, unspecified trimester: Secondary | ICD-10-CM

## 2024-06-01 DIAGNOSIS — F5105 Insomnia due to other mental disorder: Secondary | ICD-10-CM

## 2024-06-01 DIAGNOSIS — I10 Essential (primary) hypertension: Secondary | ICD-10-CM

## 2024-06-01 LAB — URINALYSIS, ROUTINE W REFLEX MICROSCOPIC
Bilirubin Urine: NEGATIVE
Glucose, UA: NEGATIVE mg/dL
Hgb urine dipstick: NEGATIVE
Ketones, ur: NEGATIVE mg/dL
Nitrite: NEGATIVE
Protein, ur: NEGATIVE mg/dL
Specific Gravity, Urine: 1.017 (ref 1.005–1.030)
pH: 6 (ref 5.0–8.0)

## 2024-06-01 LAB — CBC
HCT: 34.4 % — ABNORMAL LOW (ref 36.0–46.0)
Hemoglobin: 11.6 g/dL — ABNORMAL LOW (ref 12.0–15.0)
MCH: 31.1 pg (ref 26.0–34.0)
MCHC: 33.7 g/dL (ref 30.0–36.0)
MCV: 92.2 fL (ref 80.0–100.0)
Platelets: 252 K/uL (ref 150–400)
RBC: 3.73 MIL/uL — ABNORMAL LOW (ref 3.87–5.11)
RDW: 13.5 % (ref 11.5–15.5)
WBC: 12.2 K/uL — ABNORMAL HIGH (ref 4.0–10.5)
nRBC: 0 % (ref 0.0–0.2)

## 2024-06-01 LAB — COMPREHENSIVE METABOLIC PANEL WITH GFR
ALT: 15 U/L (ref 0–44)
AST: 18 U/L (ref 15–41)
Albumin: 2.6 g/dL — ABNORMAL LOW (ref 3.5–5.0)
Alkaline Phosphatase: 55 U/L (ref 38–126)
Anion gap: 9 (ref 5–15)
BUN: <5 mg/dL — ABNORMAL LOW (ref 6–20)
CO2: 24 mmol/L (ref 22–32)
Calcium: 8.1 mg/dL — ABNORMAL LOW (ref 8.9–10.3)
Chloride: 104 mmol/L (ref 98–111)
Creatinine, Ser: 0.57 mg/dL (ref 0.44–1.00)
GFR, Estimated: >60 mL/min (ref >60)
Glucose, Bld: 94 mg/dL (ref 70–99)
Potassium: 3.7 mmol/L (ref 3.5–5.1)
Sodium: 137 mmol/L (ref 135–145)
Total Bilirubin: 0.4 mg/dL (ref 0.0–1.2)
Total Protein: 5.7 g/dL — ABNORMAL LOW (ref 6.5–8.1)

## 2024-06-01 LAB — GLUCOSE, CAPILLARY: Glucose-Capillary: 73 mg/dL (ref 70–99)

## 2024-06-01 MED ORDER — LACTATED RINGERS IV BOLUS
1000.0000 mL | Freq: Once | INTRAVENOUS | Status: AC
  Administered 2024-06-01: 1000 mL via INTRAVENOUS

## 2024-06-01 NOTE — MAU Provider Note (Signed)
 Chief Complaint:  Dizziness, Blurred Vision, and Back Pain  HPI   Event Date/Time   First Provider Initiated Contact with Patient 06/01/24 1405     Nicole [name] is a 33 y.o. G2P1001 at [redacted]w[redacted]d who presents to maternity admissions reporting feeling off after she had breakfast and coffee this morning. She just felt a little dizzy/lightheaded and her lower back hurt a bit. Feeling better now - still a little lightheaded. Had 4 toaster strudels and coffee with sweetened creamer for breakfast. Took all her meds last night (takes psych meds at night). No other physical complaints.   Pregnancy Course: Receives care at [place], prenatal records reviewed.  Past Medical History:  Diagnosis Date   Anxiety    Depression    Diabetes mellitus without complication (HCC)    Hypothyroidism 08/07/2022   Tobacco use disorder 08/07/2022   OB History  Gravida Para Term Preterm AB Living  2 1 1  0 0 1  SAB IAB Ectopic Multiple Live Births  0 0 0 0 0    # Outcome Date GA Lbr Len/2nd Weight Sex Type Anes PTL Lv  2 Current           1 Term 03/25/18    F Vag-Spont      Past Surgical History:  Procedure Laterality Date   WISDOM TOOTH EXTRACTION Bilateral 2020   Family History  Problem Relation Age of Onset   Hypertension Father    Diabetes Father    Social History   Tobacco Use   Smoking status: Every Day    Current packs/day: 0.00    Types: Cigarettes    Last attempt to quit: 02/17/2024    Years since quitting: 0.2   Smokeless tobacco: Never  Vaping Use   Vaping status: Former   Quit date: 01/13/2024   Substances: Flavoring  Substance Use Topics   Alcohol use: Not Currently    Comment: weekly   Drug use: Not Currently    Types: Marijuana, Other-see comments    Comment: Xanax   Allergies  Allergen Reactions   Apple Juice Anaphylaxis   Other Anaphylaxis and Other (See Comments)    SALMON   Watermelon Flavoring Agent (Non-Screening) Anaphylaxis   No medications prior to admission.    I have reviewed patient's Past Medical Hx, Surgical Hx, Family Hx, Social Hx, medications and allergies.   ROS  Pertinent items noted in HPI and remainder of comprehensive ROS otherwise negative.   PHYSICAL EXAM  Patient Vitals for the past 24 hrs:  BP Temp Temp src Pulse Resp SpO2 Height Weight  06/01/24 1640 104/69 -- -- 88 16 -- -- --  06/01/24 1430 122/70 -- -- 82 -- -- -- --  06/01/24 1420 -- -- -- -- -- 100 % -- --  06/01/24 1415 121/80 -- -- 94 15 99 % -- --  06/01/24 1410 -- -- -- -- -- 100 % -- --  06/01/24 1406 114/78 -- -- 92 16 -- -- --  06/01/24 1140 113/73 98.9 F (37.2 C) Oral 93 19 99 % 5' 1 (1.549 m) 204 lb 8 oz (92.8 kg)   Constitutional: Well-developed, well-nourished female in no acute distress.  Cardiovascular: normal rate & rhythm, warm and well-perfused Respiratory: normal effort, no problems with respiration noted GI: Abd soft, non-tender, non-distended MS: Extremities nontender, no edema, normal ROM Neurologic: Alert and oriented x 4.  GU: no CVA tenderness Pelvic: exam deferred  FHR: 141   Labs: Results for orders placed or performed during the  hospital encounter of 06/01/24 (from the past 24 hours)  Urinalysis, Routine w reflex microscopic -Urine, Clean Catch     Status: Abnormal   Collection Time: 06/01/24  2:27 PM  Result Value Ref Range   Color, Urine YELLOW YELLOW   APPearance HAZY (A) CLEAR   Specific Gravity, Urine 1.017 1.005 - 1.030   pH 6.0 5.0 - 8.0   Glucose, UA NEGATIVE NEGATIVE mg/dL   Hgb urine dipstick NEGATIVE NEGATIVE   Bilirubin Urine NEGATIVE NEGATIVE   Ketones, ur NEGATIVE NEGATIVE mg/dL   Protein, ur NEGATIVE NEGATIVE mg/dL   Nitrite NEGATIVE NEGATIVE   Leukocytes,Ua MODERATE (A) NEGATIVE   RBC / HPF 0-5 0 - 5 RBC/hpf   WBC, UA 6-10 0 - 5 WBC/hpf   Bacteria, UA RARE (A) NONE SEEN   Squamous Epithelial / HPF 6-10 0 - 5 /HPF   Mucus PRESENT   CBC     Status: Abnormal   Collection Time: 06/01/24  2:27 PM  Result  Value Ref Range   WBC 12.2 (H) 4.0 - 10.5 K/uL   RBC 3.73 (L) 3.87 - 5.11 MIL/uL   Hemoglobin 11.6 (L) 12.0 - 15.0 g/dL   HCT 65.5 (L) 63.9 - 53.9 %   MCV 92.2 80.0 - 100.0 fL   MCH 31.1 26.0 - 34.0 pg   MCHC 33.7 30.0 - 36.0 g/dL   RDW 86.4 88.4 - 84.4 %   Platelets 252 150 - 400 K/uL   nRBC 0.0 0.0 - 0.2 %  Comprehensive metabolic panel with GFR     Status: Abnormal   Collection Time: 06/01/24  3:18 PM  Result Value Ref Range   Sodium 137 135 - 145 mmol/L   Potassium 3.7 3.5 - 5.1 mmol/L   Chloride 104 98 - 111 mmol/L   CO2 24 22 - 32 mmol/L   Glucose, Bld 94 70 - 99 mg/dL   BUN <5 (L) 6 - 20 mg/dL   Creatinine, Ser 9.42 0.44 - 1.00 mg/dL   Calcium  8.1 (L) 8.9 - 10.3 mg/dL   Total Protein 5.7 (L) 6.5 - 8.1 g/dL   Albumin 2.6 (L) 3.5 - 5.0 g/dL   AST 18 15 - 41 U/L   ALT 15 0 - 44 U/L   Alkaline Phosphatase 55 38 - 126 U/L   Total Bilirubin 0.4 0.0 - 1.2 mg/dL   GFR, Estimated >39 >39 mL/min   Anion gap 9 5 - 15  Glucose, capillary     Status: None   Collection Time: 06/01/24  3:55 PM  Result Value Ref Range   Glucose-Capillary 73 70 - 99 mg/dL   Imaging:  No results found.  MDM & MAU COURSE  MDM: Low  MAU Course: Orders Placed This Encounter  Procedures   Urinalysis, Routine w reflex microscopic -Urine, Clean Catch   CBC   Comprehensive metabolic panel with GFR   Glucose, capillary   EKG 12-Lead   Discharge patient   Meds ordered this encounter  Medications   lactated ringers  bolus 1,000 mL   Offered fluids and EKG/basic labs which pt accepted. Felt better after fluids and lunch. Testing normal.  ASSESSMENT   1. Supervision of high risk pregnancy, antepartum   2. Dehydration   3. [redacted] weeks gestation of pregnancy   4. Presence of fetal heart sounds in second trimester    PLAN  Discharge home in stable condition with return precautions.     Follow-up Information     [facility] Follow  up.   Why: as scheduled for ongoing prenatal  care Contact information: [address] [place]  [id number] [phone]                Allergies as of 06/01/2024       Reactions   Apple Juice Anaphylaxis   Other Anaphylaxis, Other (See Comments)   SALMON   Watermelon Flavoring Agent (non-screening) Anaphylaxis        Medication List     TAKE these medications    Abilify  Maintena 400 MG Prsy prefilled syringe Generic drug: ARIPiprazole  ER Inject 400 mg into the muscle every 28 (twenty-eight) days.   acetaminophen -caffeine  500-65 MG Tabs per tablet Commonly known as: EXCEDRIN TENSION HEADACHE Take 1 tablet by mouth every 6 (six) hours as needed. For headache.   aspirin  EC 81 MG tablet Take 1 tablet (81 mg total) by mouth daily. Swallow whole.   Blood Pressure Kit Devi 1 kit by Does not apply route as needed.   famotidine  40 MG tablet Commonly known as: PEPCID  Take 1 tablet (40 mg total) by mouth daily.   lamoTRIgine  25 MG tablet Commonly known as: LAMICTAL  Take 2 tablets (50 mg total) by mouth daily.   QUEtiapine  50 MG tablet Commonly known as: SEROquel  Take 1 tablet (50 mg total) by mouth at bedtime.   sertraline  100 MG tablet Commonly known as: ZOLOFT  Take 1 tablet (100 mg total) by mouth at bedtime.   valACYclovir  500 MG tablet Commonly known as: VALTREX  Take 1 tablet (500 mg total) by mouth daily.       Cornell Finder, CNM, MSN, IBCLC Certified Nurse Midwife, [facility]

## 2024-06-01 NOTE — MAU Note (Signed)
 Nicole [name] is a 33 y.o. at [redacted]w[redacted]d here in MAU reporting: low BP when taken this morning and dizziness, blurred vision, and lower back pain. Pt reports being tired . Denies VB or LOF  LMP:  Onset of complaint: this am Pain score: 8/10 Vitals:   06/01/24 1140  BP: 113/73  Pulse: 93  Resp: 19  Temp: 98.9 F (37.2 C)  SpO2: 99%     FHT: 141  Lab orders placed from triage: ua

## 2024-06-01 NOTE — MAU Note (Signed)
 Pt reports feeling better

## 2024-06-02 ENCOUNTER — Other Ambulatory Visit: Payer: Self-pay

## 2024-06-02 MED ORDER — PRENATAL 28-0.8 MG PO TABS: 1.0000 | ORAL_TABLET | Freq: Every day | ORAL | 12 refills | Status: DC

## 2024-06-07 ENCOUNTER — Other Ambulatory Visit: Payer: Self-pay

## 2024-06-07 MED ORDER — PROMETHAZINE HCL 25 MG PO TABS: 25.0000 mg | ORAL_TABLET | Freq: Four times a day (QID) | ORAL | 1 refills | Status: DC | PRN

## 2024-06-08 LAB — AMB RESULTS CONSOLE CBG: Glucose: 115

## 2024-06-15 ENCOUNTER — Encounter (HOSPITAL_COMMUNITY): Payer: Self-pay

## 2024-06-15 ENCOUNTER — Ambulatory Visit (HOSPITAL_COMMUNITY)

## 2024-06-16 ENCOUNTER — Ambulatory Visit (INDEPENDENT_AMBULATORY_CARE_PROVIDER_SITE_OTHER)

## 2024-06-16 ENCOUNTER — Telehealth (HOSPITAL_COMMUNITY): Payer: Self-pay

## 2024-06-16 ENCOUNTER — Encounter (HOSPITAL_COMMUNITY): Payer: Self-pay

## 2024-06-16 ENCOUNTER — Telehealth (HOSPITAL_COMMUNITY): Admitting: Psychiatry

## 2024-06-16 VITALS — BP 118/70 | HR 108 | Ht 61.0 in | Wt 203.0 lb

## 2024-06-16 DIAGNOSIS — F209 Schizophrenia, unspecified: Secondary | ICD-10-CM

## 2024-06-16 DIAGNOSIS — F250 Schizoaffective disorder, bipolar type: Secondary | ICD-10-CM | POA: Diagnosis not present

## 2024-06-16 NOTE — Progress Notes (Cosign Needed)
 Patient presented to office for injection Abilify  maintena 400 mg. Patient is [redacted] weeks pregnant. Staff was advised by Shot clinic Provider to administer medication. Patient received injection in right deltoid. Patient tolerated injection well. Patient will return in 28 days.   Patient requested refills of medications

## 2024-06-16 NOTE — Telephone Encounter (Signed)
 Patient is requesting medication refills on her Lamictal  25 mg and sertraline  100 mg.  [facility] [phone] - [place], [place] - [address]

## 2024-06-17 ENCOUNTER — Other Ambulatory Visit (HOSPITAL_COMMUNITY): Payer: Self-pay | Admitting: Family

## 2024-06-17 DIAGNOSIS — F250 Schizoaffective disorder, bipolar type: Secondary | ICD-10-CM

## 2024-06-17 MED ORDER — LAMOTRIGINE 25 MG PO TABS: 50.0000 mg | ORAL_TABLET | Freq: Every day | ORAL | 3 refills | Status: DC

## 2024-06-18 ENCOUNTER — Telehealth (HOSPITAL_COMMUNITY): Admitting: Family

## 2024-06-20 ENCOUNTER — Inpatient Hospital Stay (HOSPITAL_COMMUNITY)
Admission: AD | Admit: 2024-06-20 | Discharge: 2024-06-20 | Disposition: A | Discharge status: Home / Self Care | Attending: Obstetrics & Gynecology | Admitting: Obstetrics & Gynecology

## 2024-06-20 ENCOUNTER — Encounter (HOSPITAL_COMMUNITY): Payer: Self-pay | Admitting: Obstetrics & Gynecology

## 2024-06-20 ENCOUNTER — Other Ambulatory Visit: Payer: Self-pay

## 2024-06-20 DIAGNOSIS — R3915 Urgency of urination: Secondary | ICD-10-CM | POA: Diagnosis present

## 2024-06-20 DIAGNOSIS — Z3A22 22 weeks gestation of pregnancy: Secondary | ICD-10-CM

## 2024-06-20 DIAGNOSIS — O2342 Unspecified infection of urinary tract in pregnancy, second trimester: Secondary | ICD-10-CM | POA: Diagnosis not present

## 2024-06-20 DIAGNOSIS — R519 Headache, unspecified: Secondary | ICD-10-CM | POA: Diagnosis not present

## 2024-06-20 DIAGNOSIS — O26899 Other specified pregnancy related conditions, unspecified trimester: Secondary | ICD-10-CM

## 2024-06-20 DIAGNOSIS — O99891 Other specified diseases and conditions complicating pregnancy: Secondary | ICD-10-CM | POA: Insufficient documentation

## 2024-06-20 DIAGNOSIS — O98219 Gonorrhea complicating pregnancy, unspecified trimester: Secondary | ICD-10-CM | POA: Insufficient documentation

## 2024-06-20 DIAGNOSIS — O26892 Other specified pregnancy related conditions, second trimester: Secondary | ICD-10-CM

## 2024-06-20 LAB — URINALYSIS, ROUTINE W REFLEX MICROSCOPIC
Bacteria, UA: NONE SEEN
Bilirubin Urine: NEGATIVE
Glucose, UA: NEGATIVE mg/dL
Hgb urine dipstick: NEGATIVE
Ketones, ur: NEGATIVE mg/dL
Nitrite: NEGATIVE
Protein, ur: NEGATIVE mg/dL
Specific Gravity, Urine: 1.011 (ref 1.005–1.030)
pH: 7 (ref 5.0–8.0)

## 2024-06-20 MED ORDER — PHENAZOPYRIDINE HCL 200 MG PO TABS: 200.0000 mg | ORAL_TABLET | Freq: Three times a day (TID) | ORAL | 0 refills | Status: AC | PRN

## 2024-06-20 MED ORDER — ACETAMINOPHEN-CAFFEINE 500-65 MG PO TABS
2.0000 | ORAL_TABLET | Freq: Once | ORAL | Status: AC
  Administered 2024-06-20: 2 via ORAL
  Filled 2024-06-20: qty 2

## 2024-06-20 MED ORDER — CEFADROXIL 500 MG PO CAPS: 500.0000 mg | ORAL_CAPSULE | Freq: Two times a day (BID) | ORAL | 0 refills | Status: AC

## 2024-06-20 NOTE — MAU Note (Signed)
 Nicole [name] is a 33 y.o. at [redacted]w[redacted]d here in MAU reporting: she's been having pelvic pressure for the past three days.  Denies VN or LOF.  Reports +FM.  States has also been claustrophobic.  LMP: 01/13/2024 Onset of complaint: 3 days ago  Pain score: 0 Vitals:   06/20/24 1159  BP: 120/74  Pulse: 95  Resp: 18  Temp: 98.3 F (36.8 C)  SpO2: 100%     FHT: 154 bpm  Lab orders placed from triage: UA

## 2024-06-20 NOTE — MAU Provider Note (Signed)
 History     CSN: [id number]  Arrival date and time: 06/20/24 1120   Event Date/Time   First Provider Initiated Contact with Patient 06/20/24 1327      Chief Complaint  Patient presents with   Pelvic Pressure   HPI  Ms.Nicole [name] @ 109w5d, Patient presents with complaints of pelvic pressure, low back pain, urinary urgency, nausea, and vomiting for 3 days. Patient reports +FM. She states that her symptoms are similar to previous urinary tract infections. She denies vaginal discharge, vaginal bleeding, fever, or chills. Patient states she has not taken anything for her symptoms.   Of note patient reports concerns for mold in her home. She is concerned that this is causing headaches. She has not taken anything for the headaches. She was prescribed Excedrin however has not filled this as of today. She is looking for community resources to identify the mold in her home.    OB History     Gravida  2   Para  1   Term  1   Preterm  0   AB  0   Living  1      SAB  0   IAB  0   Ectopic  0   Multiple  0   Live Births  1           Past Medical History:  Diagnosis Date   Anxiety    Depression    Diabetes mellitus without complication (HCC)    Hypothyroidism 08/07/2022   Tobacco use disorder 08/07/2022    Past Surgical History:  Procedure Laterality Date   NO PAST SURGERIES     WISDOM TOOTH EXTRACTION Bilateral 2020    Family History  Problem Relation Age of Onset   Hypertension Father    Diabetes Father     Social History   Tobacco Use   Smoking status: Former    Current packs/day: 0.00    Average packs/day: 0.3 packs/day for 1 year (0.3 ttl pk-yrs)    Types: Cigarettes    Start date: 2021    Quit date: 02/17/2024    Years since quitting: 0.3   Smokeless tobacco: Never  Vaping Use   Vaping status: Former   Quit date: 01/13/2024   Substances: Flavoring  Substance Use Topics   Alcohol use: Not Currently    Comment: weekly   Drug use:  Not Currently    Types: Marijuana, Other-see comments    Comment: Xanax    Allergies:  Allergies  Allergen Reactions   Apple Juice Anaphylaxis   Other Anaphylaxis and Other (See Comments)    SALMON   Watermelon Flavoring Agent (Non-Screening) Anaphylaxis    Facility-Administered Medications Prior to Admission  Medication Dose Route Frequency Provider Last Rate Last Admin   ARIPiprazole  ER (ABILIFY  MAINTENA) 400 MG prefilled syringe 400 mg  400 mg Intramuscular Q28 days    400 mg at 06/16/24 1408   Medications Prior to Admission  Medication Sig Dispense Refill Last Dose/Taking   ARIPiprazole  ER (ABILIFY  MAINTENA) 400 MG PRSY prefilled syringe Inject 400 mg into the muscle every 28 (twenty-eight) days. 1 each 11 Past Week   aspirin  EC 81 MG tablet Take 1 tablet (81 mg total) by mouth daily. Swallow whole. 90 tablet 5 06/19/2024   famotidine  (PEPCID ) 40 MG tablet Take 1 tablet (40 mg total) by mouth daily. 90 tablet 3 06/19/2024   lamoTRIgine  (LAMICTAL ) 25 MG tablet Take 2 tablets (50 mg total) by mouth daily. 60 tablet  3 06/19/2024   Prenatal 28-0.8 MG TABS Take 1 tablet by mouth daily. 90 tablet 12 06/19/2024   promethazine  (PHENERGAN ) 25 MG tablet Take 1 tablet (25 mg total) by mouth every 6 (six) hours as needed for nausea or vomiting. 30 tablet 1 06/19/2024   QUEtiapine  (SEROQUEL ) 50 MG tablet Take 1 tablet (50 mg total) by mouth at bedtime. 60 tablet 1 06/19/2024   sertraline  (ZOLOFT ) 100 MG tablet Take 1 tablet (100 mg total) by mouth at bedtime. 30 tablet 3 06/19/2024   valACYclovir  (VALTREX ) 500 MG tablet Take 1 tablet (500 mg total) by mouth daily. 30 tablet 8 06/19/2024   acetaminophen -caffeine  (EXCEDRIN TENSION HEADACHE) 500-65 MG TABS per tablet Take 1 tablet by mouth every 6 (six) hours as needed. For headache. 60 tablet 0 Unknown   Blood Pressure Monitoring (BLOOD PRESSURE KIT) DEVI 1 kit by Does not apply route as needed. 1 each 0     Review of Systems  Constitutional:  Negative for  chills and fever.   Physical Exam   Blood pressure 120/74, pulse 95, temperature 98.3 F (36.8 C), temperature source Oral, resp. rate 18, height 5' 1 (1.549 m), weight 92.9 kg, last menstrual period 01/13/2024, SpO2 100%.  Physical Exam Constitutional:      General: She is not in acute distress.    Appearance: Normal appearance. She is not ill-appearing, toxic-appearing or diaphoretic.  Abdominal:     Tenderness: There is no right CVA tenderness or left CVA tenderness.  Genitourinary:    Comments: Bimanual exam: Cervix posterior, closed, external os fingertip  Exam by Delon Emms, NP  Skin:    General: Skin is warm.  Neurological:     Mental Status: She is alert and oriented to person, place, and time.  Psychiatric:        Mood and Affect: Affect is flat.        Speech: Speech is slurred.        Behavior: Behavior normal.    MAU Course  Procedures  MDM  UA with urine culture pending.  Reports symptoms consistent with UTI  + fetal heart tones via doppler.  Patient is afebrile    Assessment and Plan    1. Urinary tract infection in mother during second trimester of pregnancy   2. Abdominal pain affecting pregnancy   3. Low back pain during pregnancy in second trimester   4. Frequent headaches   5. [redacted] weeks gestation of pregnancy      P:  Dc home Return to MAU if symptoms worsen Rx: Duricef,  ok to stop antibiotics if Culture is negative Pyridium  sent Recommend contacting [facility] health department for mold concerns.    Emms Delon I, NP 06/20/2024 2:20 PM

## 2024-06-21 LAB — CULTURE, OB URINE: Culture: NO GROWTH

## 2024-06-28 ENCOUNTER — Ambulatory Visit: Attending: Obstetrics and Gynecology | Admitting: Maternal & Fetal Medicine

## 2024-06-28 ENCOUNTER — Other Ambulatory Visit: Payer: Self-pay | Admitting: *Deleted

## 2024-06-28 ENCOUNTER — Telehealth (HOSPITAL_COMMUNITY): Admitting: Psychiatry

## 2024-06-28 ENCOUNTER — Ambulatory Visit (HOSPITAL_BASED_OUTPATIENT_CLINIC_OR_DEPARTMENT_OTHER)

## 2024-06-28 VITALS — BP 103/66 | HR 77

## 2024-06-28 DIAGNOSIS — O10912 Unspecified pre-existing hypertension complicating pregnancy, second trimester: Secondary | ICD-10-CM

## 2024-06-28 DIAGNOSIS — O99342 Other mental disorders complicating pregnancy, second trimester: Secondary | ICD-10-CM | POA: Insufficient documentation

## 2024-06-28 DIAGNOSIS — F209 Schizophrenia, unspecified: Secondary | ICD-10-CM

## 2024-06-28 DIAGNOSIS — O99212 Obesity complicating pregnancy, second trimester: Secondary | ICD-10-CM | POA: Insufficient documentation

## 2024-06-28 DIAGNOSIS — E039 Hypothyroidism, unspecified: Secondary | ICD-10-CM

## 2024-06-28 DIAGNOSIS — Z362 Encounter for other antenatal screening follow-up: Secondary | ICD-10-CM | POA: Insufficient documentation

## 2024-06-28 DIAGNOSIS — F1990 Other psychoactive substance use, unspecified, uncomplicated: Secondary | ICD-10-CM | POA: Insufficient documentation

## 2024-06-28 DIAGNOSIS — Z3A23 23 weeks gestation of pregnancy: Secondary | ICD-10-CM | POA: Insufficient documentation

## 2024-06-28 DIAGNOSIS — E669 Obesity, unspecified: Secondary | ICD-10-CM | POA: Diagnosis not present

## 2024-06-28 DIAGNOSIS — F319 Bipolar disorder, unspecified: Secondary | ICD-10-CM | POA: Diagnosis not present

## 2024-06-28 DIAGNOSIS — O99282 Endocrine, nutritional and metabolic diseases complicating pregnancy, second trimester: Secondary | ICD-10-CM | POA: Insufficient documentation

## 2024-06-28 DIAGNOSIS — O99210 Obesity complicating pregnancy, unspecified trimester: Secondary | ICD-10-CM

## 2024-06-28 DIAGNOSIS — O09299 Supervision of pregnancy with other poor reproductive or obstetric history, unspecified trimester: Secondary | ICD-10-CM

## 2024-06-28 DIAGNOSIS — O09292 Supervision of pregnancy with other poor reproductive or obstetric history, second trimester: Secondary | ICD-10-CM | POA: Insufficient documentation

## 2024-06-28 DIAGNOSIS — O09892 Supervision of other high risk pregnancies, second trimester: Secondary | ICD-10-CM | POA: Diagnosis not present

## 2024-06-28 DIAGNOSIS — O99322 Drug use complicating pregnancy, second trimester: Secondary | ICD-10-CM | POA: Diagnosis not present

## 2024-06-28 DIAGNOSIS — O24112 Pre-existing diabetes mellitus, type 2, in pregnancy, second trimester: Secondary | ICD-10-CM | POA: Insufficient documentation

## 2024-06-28 DIAGNOSIS — O0990 Supervision of high risk pregnancy, unspecified, unspecified trimester: Secondary | ICD-10-CM

## 2024-06-28 NOTE — Progress Notes (Signed)
 Patient information  Patient Name: Nicole [name]  Patient MRN:   [id number]  Referring practice: MFM Referring Provider: [facility]  Problem List   Patient Active Problem List   Diagnosis Date Noted   Gonorrhea in pregnancy 06/20/2024   History of gestational diabetes mellitus (GDM) in prior pregnancy, currently pregnant 05/27/2024   Abnormal thyroid  function test 05/27/2024   Tobacco smoking affecting pregnancy 05/27/2024   Obesity affecting pregnancy, antepartum 05/17/2024   Essential hypertension 02/18/2024   Type 2 diabetes mellitus (HCC) 02/18/2024   Supervision of high risk pregnancy, antepartum 02/18/2024   Schizoaffective disorder, bipolar type (HCC) 11/07/2023   Anxiety state 12/04/2022   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 G2P1001 at [redacted]w[redacted]d here for ultrasound and consultation. Nicole [name] is doing well today with no acute concerns. Today we focused on the following:   DM2: The patient reports that she has not been checking her blood sugars because she ran out of supplies.  I discussed the importance of proper glycemic control during pregnancy.  The patient will call her doctor today to get her supplies refilled.  Schizophrenia: The patient reports that she is doing well and is compliant with her medications.  Chronic hypertension: Blood pressure is currently well-controlled without medications.  Hypothyroidism: The last TSH was at goal of 2.5.  She reports she is not taking Synthroid .  Continue to check TSH levels every 4 to 6 weeks.  If the TSH falls above 2.5 then a free T4 and total T3  should be checked.  The patient had time to ask questions that were answered to her satisfaction.  She verbalized understanding and agrees to proceed with the plan below.  Sonographic findings Single intrauterine pregnancy at 23w 6d.  Fetal cardiac activity:  Observed and appears normal. Presentation: Cephalic. Interval fetal anatomy appears normal. Fetal biometry shows the estimated fetal weight at the 46 percentile. Amniotic fluid volume: Within normal limits. MVP: 4.64 cm. Placenta: Anterior.  There are limitations of prenatal ultrasound such as the inability to detect certain abnormalities due to poor visualization. Various factors such as fetal position, gestational age and maternal body habitus may increase the difficulty in visualizing the fetal anatomy.    Recommendations -Patient to contact her OB provider regarding a refill of her diabetes supplies asap. -Serial growth ultrasounds every 4-6 weeks until delivery. -Antenatal testing to start around 32 weeks due to the multitude of medications and type 2 diabetes with unknown level of control. These can be NSTs at [facility].  -Continue to check TSH levels every 4 to 6 weeks.  If the TSH falls above 2.5 then a free T4 and total T3 should be checked. -Blood pressure goal less than 140/90.  Add antihypertensive needed. -Delivery timing likely around 37 to 38 weeks depending on the level of glucose control and prenatal course.  Review of Systems: A review of systems was performed and was negative except per HPI   Vitals and Physical Exam    06/28/2024    9:30 AM 06/20/2024    2:03 PM 06/20/2024   12:52 PM  Vitals with BMI  Systolic 103 103  109  Diastolic 66 64 70  Pulse 77 97 88    Sitting comfortably on the sonogram table Nonlabored breathing Normal rate and rhythm Abdomen is nontender  Past pregnancies OB History  Gravida Para Term Preterm AB Living  2 1 1  0 0 1  SAB IAB Ectopic Multiple Live Births  0 0 0 0 1    #  Outcome Date GA Lbr Len/2nd Weight Sex Type Anes PTL Lv  2 Current           1 Term 03/25/18    F Vag-Spont   LIV     I spent 40 minutes reviewing the patients chart, including labs and images as well as counseling the patient about her medical conditions. Greater than 50% of the time was spent in direct face-to-face patient counseling.  Nicole [name]  MFM, [facility]   06/28/2024  10:33 AM

## 2024-07-08 ENCOUNTER — Encounter (HOSPITAL_COMMUNITY): Payer: Self-pay | Admitting: Obstetrics and Gynecology

## 2024-07-08 ENCOUNTER — Inpatient Hospital Stay (HOSPITAL_COMMUNITY)
Admission: AD | Admit: 2024-07-08 | Discharge: 2024-07-08 | Disposition: A | Discharge status: Home / Self Care | Attending: Obstetrics and Gynecology | Admitting: Obstetrics and Gynecology

## 2024-07-08 ENCOUNTER — Encounter (HOSPITAL_COMMUNITY): Payer: Self-pay | Admitting: Psychiatry

## 2024-07-08 ENCOUNTER — Telehealth (INDEPENDENT_AMBULATORY_CARE_PROVIDER_SITE_OTHER): Admitting: Psychiatry

## 2024-07-08 DIAGNOSIS — R309 Painful micturition, unspecified: Secondary | ICD-10-CM | POA: Diagnosis not present

## 2024-07-08 DIAGNOSIS — O23592 Infection of other part of genital tract in pregnancy, second trimester: Secondary | ICD-10-CM | POA: Diagnosis not present

## 2024-07-08 DIAGNOSIS — F250 Schizoaffective disorder, bipolar type: Secondary | ICD-10-CM | POA: Diagnosis not present

## 2024-07-08 DIAGNOSIS — Y939 Activity, unspecified: Secondary | ICD-10-CM | POA: Diagnosis not present

## 2024-07-08 DIAGNOSIS — Z79899 Other long term (current) drug therapy: Secondary | ICD-10-CM | POA: Insufficient documentation

## 2024-07-08 DIAGNOSIS — F411 Generalized anxiety disorder: Secondary | ICD-10-CM | POA: Diagnosis not present

## 2024-07-08 DIAGNOSIS — O9A212 Injury, poisoning and certain other consequences of external causes complicating pregnancy, second trimester: Secondary | ICD-10-CM | POA: Diagnosis not present

## 2024-07-08 DIAGNOSIS — S30814A Abrasion of vagina and vulva, initial encounter: Secondary | ICD-10-CM | POA: Diagnosis not present

## 2024-07-08 DIAGNOSIS — Z8619 Personal history of other infectious and parasitic diseases: Secondary | ICD-10-CM | POA: Insufficient documentation

## 2024-07-08 DIAGNOSIS — B3731 Acute candidiasis of vulva and vagina: Secondary | ICD-10-CM | POA: Diagnosis not present

## 2024-07-08 DIAGNOSIS — Z79624 Long term (current) use of inhibitors of nucleotide synthesis: Secondary | ICD-10-CM | POA: Insufficient documentation

## 2024-07-08 DIAGNOSIS — Z3A25 25 weeks gestation of pregnancy: Secondary | ICD-10-CM | POA: Insufficient documentation

## 2024-07-08 DIAGNOSIS — O0990 Supervision of high risk pregnancy, unspecified, unspecified trimester: Secondary | ICD-10-CM

## 2024-07-08 DIAGNOSIS — Y929 Unspecified place or not applicable: Secondary | ICD-10-CM | POA: Diagnosis not present

## 2024-07-08 DIAGNOSIS — O26892 Other specified pregnancy related conditions, second trimester: Secondary | ICD-10-CM | POA: Insufficient documentation

## 2024-07-08 HISTORY — DX: Herpesviral infection, unspecified: B00.9

## 2024-07-08 HISTORY — DX: Schizophrenia, unspecified: F20.9

## 2024-07-08 LAB — URINALYSIS, ROUTINE W REFLEX MICROSCOPIC
Bilirubin Urine: NEGATIVE
Glucose, UA: NEGATIVE mg/dL
Hgb urine dipstick: NEGATIVE
Ketones, ur: NEGATIVE mg/dL
Nitrite: NEGATIVE
Protein, ur: NEGATIVE mg/dL
Specific Gravity, Urine: 1.018 (ref 1.005–1.030)
pH: 6 (ref 5.0–8.0)

## 2024-07-08 LAB — WET PREP, GENITAL
Clue Cells Wet Prep HPF POC: NONE SEEN
Sperm: NONE SEEN
Trich, Wet Prep: NONE SEEN
WBC, Wet Prep HPF POC: >=10 — AB (ref <10)
Yeast Wet Prep HPF POC: NONE SEEN

## 2024-07-08 MED ORDER — TERCONAZOLE 0.4 % VA CREA: 1.0000 | TOPICAL_CREAM | Freq: Every day | VAGINAL | 0 refills | Status: DC

## 2024-07-08 MED ORDER — LIDOCAINE HCL URETHRAL/MUCOSAL 2 % EX GEL: 1.0000 | CUTANEOUS | 0 refills | Status: DC | PRN

## 2024-07-08 MED ORDER — SERTRALINE HCL 100 MG PO TABS: 100.0000 mg | ORAL_TABLET | Freq: Every day | ORAL | 3 refills | Status: DC

## 2024-07-08 MED ORDER — QUETIAPINE FUMARATE 50 MG PO TABS: 50.0000 mg | ORAL_TABLET | Freq: Every day | ORAL | 1 refills | Status: DC

## 2024-07-08 MED ORDER — ACETAMINOPHEN 500 MG PO TABS
1000.0000 mg | ORAL_TABLET | Freq: Once | ORAL | Status: AC
  Administered 2024-07-08: 1000 mg via ORAL
  Filled 2024-07-08: qty 2

## 2024-07-08 MED ORDER — ABILIFY MAINTENA 400 MG IM PRSY
400.0000 mg | PREFILLED_SYRINGE | INTRAMUSCULAR | 11 refills | Status: DC
Start: 2024-07-08 — End: 2024-08-04

## 2024-07-08 MED ORDER — LAMOTRIGINE 25 MG PO TABS: 50.0000 mg | ORAL_TABLET | Freq: Every day | ORAL | 3 refills | Status: DC

## 2024-07-08 NOTE — MAU Note (Signed)
 Nicole [name] is a 33 y.o. at [redacted]w[redacted]d here in MAU reporting: was having sex, started bothering her - causing vaginal pain.  Now seeing yellow d/c. No LOF or vag bleeding. Having pain with urination when it hit that spot, it hurts.  Onset of complaint: last night Pain score: 9 Vitals:   07/08/24 1522  BP: 111/70  Pulse: 87  Resp: 17  Temp: 98.6 F (37 C)  SpO2: 99%     QYU:imzdd on, reports baby is moving a lot Lab orders placed from triage:  urine, vag swabs

## 2024-07-08 NOTE — Discharge Instructions (Addendum)
 Thank you for coming to the Maternity Assessment Unit (MAU) at [facility]. You came in for pain in your vagina during urination. You were found to have abrasions, or small cuts, in your vagina, which cause pain, burning, and discomfort. You can use lidocaine  gel on your cuts to make them numb and less painful. Also use Peri bottle to spray during urination to reduce pain and discomfort.  You were also found to have a yeast infection. You will use antifungal cream called Terazol applied inside the vagina and on the labia for 7 days at bedtime.   If your symptoms have not improved by next Monday 07/12/24, please see your OB provider.

## 2024-07-08 NOTE — MAU Provider Note (Addendum)
 History Nicole [name] is a 33 y.o. female G2P1001 at [redacted]w[redacted]d PMH genital herpes on daily Valtrex  who presents with pain with urination.   Chief Complaint  Patient presents with   Vaginal Discharge   Vaginal Pain   HPI Patient endorses irritation and a raw feeling when urinating yesterday 07/07/24. Yesterday evening, she had sexual intercourse, which she described as consensual, rough penis-in-vagina without sex toys or piercings. After sex, she reports that there was new-onset severe pain on urination and discomfort at rest around her upper left labia near the urethra and clitoris. She has never experienced pain like this before and says she does not know if it feels similar to her initial HSV outbreak. She tried to treat it with topical alcohol and peroxide, which worsened her symptoms. Sitting still and letting air into the area reportedly improve the pain.  Of note, patient reports history of HSV on Valtrex  500 mg daily with good adherence and a history of gonorrhea, chlamydia, and trichomonas, which she reports were treated. She denies hx PID and concern for STI today.  Past Medical History:  Diagnosis Date   Anxiety    Depression    Diabetes mellitus without complication (HCC)    Hypothyroidism 08/07/2022   Tobacco use disorder 08/07/2022    Past Surgical History:  Procedure Laterality Date   NO PAST SURGERIES     WISDOM TOOTH EXTRACTION Bilateral 2020    Family History  Problem Relation Age of Onset   Hypertension Father    Diabetes Father     Social History   Tobacco Use   Smoking status: Former    Current packs/day: 0.00    Average packs/day: 0.3 packs/day for 1 year (0.3 ttl pk-yrs)    Types: Cigarettes    Start date: 2021    Quit date: 02/17/2024    Years since quitting: 0.3   Smokeless tobacco: Never  Vaping Use   Vaping status: Former   Quit date: 01/13/2024   Substances: Flavoring  Substance Use Topics   Alcohol use: Not Currently    Comment: weekly    Drug use: Not Currently    Types: Marijuana, Other-see comments    Comment: Xanax    Allergies:  Allergies  Allergen Reactions   Apple Juice Anaphylaxis   Other Anaphylaxis and Other (See Comments)    SALMON   Watermelon Flavoring Agent (Non-Screening) Anaphylaxis    Facility-Administered Medications Prior to Admission  Medication Dose Route Frequency Provider Last Rate Last Admin   ARIPiprazole  ER (ABILIFY  MAINTENA) 400 MG prefilled syringe 400 mg  400 mg Intramuscular Q28 days    400 mg at 06/16/24 1408   Medications Prior to Admission  Medication Sig Dispense Refill Last Dose/Taking   acetaminophen -caffeine  (EXCEDRIN TENSION HEADACHE) 500-65 MG TABS per tablet Take 1 tablet by mouth every 6 (six) hours as needed. For headache. 60 tablet 0    ARIPiprazole  ER (ABILIFY  MAINTENA) 400 MG PRSY prefilled syringe Inject 400 mg into the muscle every 28 (twenty-eight) days. 1 each 11    aspirin  EC 81 MG tablet Take 1 tablet (81 mg total) by mouth daily. Swallow whole. 90 tablet 5    Blood Pressure Monitoring (BLOOD PRESSURE KIT) DEVI 1 kit by Does not apply route as needed. 1 each 0    famotidine  (PEPCID ) 40 MG tablet Take 1 tablet (40 mg total) by mouth daily. 90 tablet 3    lamoTRIgine  (LAMICTAL ) 25 MG tablet Take 2 tablets (50 mg total) by mouth daily. 60  tablet 3    Prenatal 28-0.8 MG TABS Take 1 tablet by mouth daily. 90 tablet 12    promethazine  (PHENERGAN ) 25 MG tablet Take 1 tablet (25 mg total) by mouth every 6 (six) hours as needed for nausea or vomiting. 30 tablet 1    QUEtiapine  (SEROQUEL ) 50 MG tablet Take 1 tablet (50 mg total) by mouth at bedtime. 60 tablet 1    sertraline  (ZOLOFT ) 100 MG tablet Take 1 tablet (100 mg total) by mouth at bedtime. 30 tablet 3    valACYclovir  (VALTREX ) 500 MG tablet Take 1 tablet (500 mg total) by mouth daily. 30 tablet 8     Review of Systems negative aside from what is listed in HPI.  Physical Exam Blood pressure 111/70, pulse 87,  temperature 98.6 F (37 C), temperature source Oral, resp. rate 17, height 5' 1 (1.549 m), weight 91.8 kg, last menstrual period 01/13/2024, SpO2 99%. Physical Exam Exam conducted with a chaperone present.  Constitutional:      General: She is not in acute distress.    Appearance: She is not toxic-appearing.  Cardiovascular:     Rate and Rhythm: Normal rate.  Pulmonary:     Effort: Pulmonary effort is normal. No respiratory distress.  Genitourinary:     Comments: Inner thighs and vulva erythematous. Copious thick white discharge on inner thighs, vulva, and vaginal canal. Small ~5 mm abrasions present on anterior vaginal wall (see image above). Abrasions TTP and provoked wincing. No ulcers or vesicles appreciated. Neurological:     Mental Status: She is alert. Mental status is at baseline.  Psychiatric:        Attention and Perception: Attention normal.        Mood and Affect: Mood normal.        Speech: Speech normal.        Behavior: Behavior normal.        Thought Content: Thought content normal.    Fetal Tracing:  Baseline: 155 Variability: moderate Accels: 10x10, 1 15x15 noted Decels: 2 variables, otherwise none  Toco: none   Labs - UA pending - Wet prep: WBCs >=10, otherwise negative/wnl  Assessment and Plan  # Vaginal abrasions # Hx HSV on Valtrex  500 mg every day Nicole [name] is a 33 y.o. female G2P1001 at [redacted]w[redacted]d PMH genital herpes on daily Valtrex  who presents with pain with urination. Top of the differential is abrasions 2/2 sexual intercourse. Less likely is HSV outbreak given adherence to Valtrex  500 every day and no evidence of outbreak on exam. Plan: - Lidocaine  2% jelly PRN for pain - Tylenol  1000 mg q6h PRN for moderate pain - Continue Valtrex  500 mg every day for HSV suppression - Encouraged patient to use lubricant during sex - Encouraged patient to follow up with OB provider if symptoms have not improved by Monday 07/12/24  # Vulvovaginal  candidiasis Vulvar/vaginal exam consistent with candidiasis, prompting empiric treatment with antifungals despite negative wet prep. Plan: - Terazol 0.4% vaginal cream 1 applicator vaginally at bedtime for 7 days  Attestation of Supervision of Student:  I confirm that I have verified the information documented in the medical student's note and that I have also personally reperformed the history, physical exam and all medical decision making activities.  I have verified that all services and findings are accurately documented in this student's note; and I agree with management and plan as outlined in the documentation. I have also made any necessary editorial changes.  Aleck CHRISTELLA Fireman, CNM [facility] [employer] 07/08/2024 5:28 PM

## 2024-07-08 NOTE — Progress Notes (Signed)
 [facility] MD/PA/NP OP Progress Note Virtual Visit via Video Note  I connected with Nicole [name] on 07/08/24 at  3:00 PM EDT by a video enabled telemedicine application and verified that I am speaking with the correct person using two identifiers.  Location: Patient: Home Provider: Clinic   I discussed the limitations of evaluation and management by telemedicine and the availability of in person appointments. The patient expressed understanding and agreed to proceed.  I provided 30 minutes of non-face-to-face time during this encounter.     07/08/2024 12:33 PM Nicole [name]  MRN:  [id number]  Chief Complaint: Can I breast feed my baby  HPI: 33 year old female seen today for follow-up psychiatric evaluation.  She has a psychiatric history of schizophrenia, depression, anxiety, insomnia, tobacco use, and borderline intellectual functioning. She is currently managed on Abilify  maintainer 400 mg every 28 days,trazodone  100 mg nightly as needed, Seroquel  50 mg nightly, Zoloft  100 mg daily, and Lamictal  50 mg daily.  She inform her that her medications are effective in managing her psychiatric condition.  Today she is well-groomed, pleasant, cooperative, and engaged in conversation.  Patient informed Clinical research associate that she is 6 months pregnant.  She notes that her pregnancy has been well.  She asked writer if she will be able to breast-feed her baby.  Provider informed patient that her medications may be excreted in her breast milk and notes that bottlefeeding her child maybe safer.  Patient informed that if she chooses to breast-feeds her baby while on the above medications it could cause unwanted side effects.  She endorsed understanding and noted that she will talk to the pediatrician about breast-feeding as well.   Since her last visit she reports that her mood is stable and notes that she has minimal anxiety and depression.  Today provider conducted a GAD-7 and the patient scored a 7. Provider also  conducted PHQ-9 the patient scored a 3.  She notes that her sleep fluctuates because of her pregnancy.  She endorses having an adequate appetite.  At times patient reports that she feels paranoid.  She informed Clinical research associate however she is able to cope with this noted that she has support of her family.  Today she denies SI/HI/AVH or mania.   Patient denies alcohol or illegal drug use.  She reports that she spends most days at her day program ([facility])  Provider discussed the risk and benefits of being on 2 antipsychotics while pregnant.  Patient feels that the benefits outweigh the risks.  No medication changes made today.  Patient agreeable to taking medication as prescribed.  No other concerns noted at this time.  Visit Diagnosis:    ICD-10-CM   1. Schizoaffective disorder, bipolar type (HCC)  F25.0 QUEtiapine  (SEROQUEL ) 50 MG tablet    lamoTRIgine  (LAMICTAL ) 25 MG tablet    ARIPiprazole  ER (ABILIFY  MAINTENA) 400 MG PRSY prefilled syringe    2. Anxiety state  F41.1 sertraline  (ZOLOFT ) 100 MG tablet          Past Psychiatric History: Schizophrenia (unspecified type), Major depressive disorder, Patient reports that she has been hospitalized due to mental health multiple times.  Patient states that she has done stints at [facility] and [facility]  Past Medical History:  Past Medical History:  Diagnosis Date   Anxiety    Depression    Diabetes mellitus without complication (HCC)    Hypothyroidism 08/07/2022   Tobacco use disorder 08/07/2022    Past Surgical History:  Procedure Laterality Date   NO PAST  SURGERIES     WISDOM TOOTH EXTRACTION Bilateral 2020    Family Psychiatric History: mother attempted suicide, Patient reports that her brother used to abuse marijuana and alcohol. Patient states that her mother used to abuse marijuana and alcohol.   Family History:  Family History  Problem Relation Age of Onset   Hypertension Father    Diabetes Father     Social History:  Social  History   Socioeconomic History   Marital status: Single    Spouse name: Not on file   Number of children: Not on file   Years of education: Not on file   Highest education level: Not on file  Occupational History   Not on file  Tobacco Use   Smoking status: Former    Current packs/day: 0.00    Average packs/day: 0.3 packs/day for 1 year (0.3 ttl pk-yrs)    Types: Cigarettes    Start date: 2021    Quit date: 02/17/2024    Years since quitting: 0.3   Smokeless tobacco: Never  Vaping Use   Vaping status: Former   Quit date: 01/13/2024   Substances: Flavoring  Substance and Sexual Activity   Alcohol use: Not Currently    Comment: weekly   Drug use: Not Currently    Types: Marijuana, Other-see comments    Comment: Xanax   Sexual activity: Yes    Partners: Female  Other Topics Concern   Not on file  Social History Narrative   Not on file   Social Drivers of Health   Financial Resource Strain: Low Risk  (12/13/2022)   Overall Financial Resource Strain (CARDIA)    Difficulty of Paying Living Expenses: Not hard at all  Food Insecurity: No Food Insecurity (06/20/2024)   Hunger Vital Sign    Worried About Running Out of Food in the Last Year: Never true    Ran Out of Food in the Last Year: Never true  Transportation Needs: No Transportation Needs (06/20/2024)   PRAPARE - Administrator, Civil Service (Medical): No    Lack of Transportation (Non-Medical): No  Physical Activity: Inactive (12/13/2022)   Exercise Vital Sign    Days of Exercise per Week: 0 days    Minutes of Exercise per Session: 0 min  Stress: Stress Concern Present (12/13/2022)   [employer] of Occupational Health - Occupational Stress Questionnaire    Feeling of Stress : Very much  Social Connections: Socially Integrated (12/13/2022)   Social Connection and Isolation Panel    Frequency of Communication with Friends and Family: More than three times a week    Frequency of Social Gatherings with Friends  and Family: Three times a week    Attends Religious Services: More than 4 times per year    Active Member of Clubs or Organizations: Yes    Attends Banker Meetings: More than 4 times per year    Marital Status: Living with partner    Allergies:  Allergies  Allergen Reactions   Apple Juice Anaphylaxis   Other Anaphylaxis and Other (See Comments)    SALMON   Watermelon Flavoring Agent (Non-Screening) Anaphylaxis    Metabolic Disorder Labs: Lab Results  Component Value Date   HGBA1C 5.4 04/06/2024   MPG 99.67 01/24/2024   MPG 117 10/15/2022   Lab Results  Component Value Date   PROLACTIN 2.1 (L) 01/24/2024   PROLACTIN 0.6 (L) 01/14/2024   Lab Results  Component Value Date   CHOL 165 01/24/2024   TRIG  49 01/24/2024   HDL 53 01/24/2024   CHOLHDL 3.1 01/24/2024   VLDL 10 01/24/2024   LDLCALC 102 (H) 01/24/2024   LDLCALC 112 (H) 07/25/2022   Lab Results  Component Value Date   TSH 2.550 01/24/2024   TSH 0.793 09/05/2022    Therapeutic Level Labs: No results found for: LITHIUM No results found for: VALPROATE No results found for: CBMZ  Current Medications: Current Outpatient Medications  Medication Sig Dispense Refill   acetaminophen -caffeine  (EXCEDRIN TENSION HEADACHE) 500-65 MG TABS per tablet Take 1 tablet by mouth every 6 (six) hours as needed. For headache. 60 tablet 0   ARIPiprazole  ER (ABILIFY  MAINTENA) 400 MG PRSY prefilled syringe Inject 400 mg into the muscle every 28 (twenty-eight) days. 1 each 11   aspirin  EC 81 MG tablet Take 1 tablet (81 mg total) by mouth daily. Swallow whole. 90 tablet 5   Blood Pressure Monitoring (BLOOD PRESSURE KIT) DEVI 1 kit by Does not apply route as needed. 1 each 0   famotidine  (PEPCID ) 40 MG tablet Take 1 tablet (40 mg total) by mouth daily. 90 tablet 3   lamoTRIgine  (LAMICTAL ) 25 MG tablet Take 2 tablets (50 mg total) by mouth daily. 60 tablet 3   Prenatal 28-0.8 MG TABS Take 1 tablet by mouth daily. 90  tablet 12   promethazine  (PHENERGAN ) 25 MG tablet Take 1 tablet (25 mg total) by mouth every 6 (six) hours as needed for nausea or vomiting. 30 tablet 1   QUEtiapine  (SEROQUEL ) 50 MG tablet Take 1 tablet (50 mg total) by mouth at bedtime. 60 tablet 1   sertraline  (ZOLOFT ) 100 MG tablet Take 1 tablet (100 mg total) by mouth at bedtime. 30 tablet 3   valACYclovir  (VALTREX ) 500 MG tablet Take 1 tablet (500 mg total) by mouth daily. 30 tablet 8   Current Facility-Administered Medications  Medication Dose Route Frequency Provider Last Rate Last Admin   ARIPiprazole  ER (ABILIFY  MAINTENA) 400 MG prefilled syringe 400 mg  400 mg Intramuscular Q28 days    400 mg at 06/16/24 1408     Musculoskeletal: Strength & Muscle Tone: within normal limits Gait & Station: normal Patient leans: N/A  Psychiatric Specialty Exam: Review of Systems  Last menstrual period 01/13/2024.There is no height or weight on file to calculate BMI.  General Appearance: Well Groomed  Eye Contact:  Good  Speech:  Clear and Coherent and Slow  Volume:  Normal  Mood:  Euthymic,   Affect:  Appropriate and Congruent  Thought Process:  Coherent, Goal Directed, and Linear  Orientation:  Full (Time, Place, and Person)  Thought Content: Logical and Paranoid Ideation   Suicidal Thoughts:  No  Homicidal Thoughts:  No  Memory:  Immediate;   Good Recent;   Good Remote;   Good  Judgement:  Good  Insight:  Good  Psychomotor Activity:  Normal  Concentration:  Concentration: Good and Attention Span: Good  Recall:  Good  Fund of Knowledge: Good  Language: Good  Akathisia:  No  Handed:  Right  AIMS (if indicated):  not done,  Assets:  Communication Skills Desire for Improvement Housing Leisure Time Physical Health Social Support  ADL's:  Intact  Cognition: WNL  Sleep:  Good   Screenings: AIMS    Flowsheet Row Office Visit from 03/16/2024 in [facility] Office Visit from 01/30/2023 in [facility]  AIMS Total Score 0 2   GAD-7    Flowsheet Row Video Visit from  07/08/2024 in [facility] Initial Prenatal from 04/06/2024 in [facility] for [facility] [place] Office Visit from 03/16/2024 in [facility] Clinical Support from 02/18/2024 in [facility] for [facility] [place] Video Visit from 01/21/2024 in [facility]  Total GAD-7 Score 7 7 14  0 17   PHQ2-9    Flowsheet Row Video Visit from 07/08/2024 in [facility] Initial Prenatal from 04/06/2024 in [facility] for [facility] at [place] Office Visit from 03/16/2024 in [facility] Clinical Support from 02/18/2024 in [facility] for [name] Healthcare at [place] ED from 01/24/2024 in [facility]  PHQ-2 Total Score 0 0 0 0 2  PHQ-9 Total Score 3 0 3 0 9   Flowsheet Row Video Visit from 07/08/2024 in [facility] Admission (Discharged) from 06/20/2024 in [place] 1S Maternity Assessment Unit Admission (Discharged) from 06/01/2024 in [facility] 1S Maternity Assessment Unit  C-SSRS RISK CATEGORY No Risk No Risk No Risk     Assessment and Plan: Patient reports that her anxiety and depression has improved. She continues to experience paranoia. Provider discussed the risk and benefits of being on 2 antipsychotics while pregnant.  Patient feels that the benefits outweigh the risks.  No medication changes made today.  Patient agreeable to taking medication as prescribed.    1. Schizoaffective disorder, bipolar type (HCC)  Continue- QUEtiapine  (SEROQUEL ) 50 MG tablet; Take 1 tablet (50 mg total) by mouth at bedtime.  Dispense: 60 tablet; Refill: 1 Continue- lamoTRIgine  (LAMICTAL ) 25 MG tablet; Take 2 tablets (50 mg total) by mouth daily.  Dispense: 60 tablet; Refill: 3 Continue- ARIPiprazole  ER  (ABILIFY  MAINTENA) 400 MG PRSY prefilled syringe; Inject 400 mg into the muscle every 28 (twenty-eight) days.  Dispense: 1 each; Refill: 11  2. Anxiety state  Continue- sertraline  (ZOLOFT ) 100 MG tablet; Take 1 tablet (100 mg total) by mouth at bedtime.  Dispense: 30 tablet; Refill: 3    Collaboration of Care: Collaboration of Care: Other provider involved in patient's care AEB counselor, PCP, shock clinic staff  Patient/Guardian was advised Release of Information must be obtained prior to any record release in order to collaborate their care with an outside provider. Patient/Guardian was advised if they have not already done so to contact the registration department to sign all necessary forms in order for us  to release information regarding their care.   Consent: Patient/Guardian gives verbal consent for treatment and assignment of benefits for services provided during this visit. Patient/Guardian expressed understanding and agreed to proceed.   Follow-up in 2 months Follow-up with shot clinic in 1 month Zane FORBES Bach, NP 07/08/2024, 12:33 PM

## 2024-07-09 LAB — GC/CHLAMYDIA PROBE AMP (~~LOC~~) NOT AT ARMC
Chlamydia: NEGATIVE
Comment: NEGATIVE
Comment: NORMAL
Neisseria Gonorrhea: NEGATIVE

## 2024-07-12 ENCOUNTER — Ambulatory Visit (INDEPENDENT_AMBULATORY_CARE_PROVIDER_SITE_OTHER): Admitting: Advanced Practice Midwife

## 2024-07-12 ENCOUNTER — Encounter: Payer: Self-pay | Admitting: Advanced Practice Midwife

## 2024-07-12 VITALS — BP 96/68 | HR 83 | Wt 201.0 lb

## 2024-07-12 DIAGNOSIS — O0990 Supervision of high risk pregnancy, unspecified, unspecified trimester: Secondary | ICD-10-CM

## 2024-07-12 DIAGNOSIS — Z3A25 25 weeks gestation of pregnancy: Secondary | ICD-10-CM

## 2024-07-12 DIAGNOSIS — M67431 Ganglion, right wrist: Secondary | ICD-10-CM | POA: Diagnosis not present

## 2024-07-12 NOTE — Progress Notes (Signed)
   PRENATAL VISIT NOTE  Subjective:  Nicole [name] is a 33 y.o. G2P1001 at [redacted]w[redacted]d being seen today for ongoing prenatal care.  She is currently monitored for the following issues for this high-risk pregnancy and has Schizophrenia, unspecified (HCC); MDD (major depressive disorder), recurrent episode, moderate (HCC); Hypothyroidism; Tobacco use disorder; Borderline intellectual functioning; FAS (fetal alcohol syndrome); Anxiety state; Insomnia due to other mental disorder; Schizoaffective disorder, bipolar type (HCC); Essential hypertension; Type 2 diabetes mellitus affecting pregnancy in second trimester, antepartum; Supervision of high risk pregnancy, antepartum; Obesity affecting pregnancy, antepartum; History of gestational diabetes mellitus (GDM) in prior pregnancy, currently pregnant; Abnormal thyroid  function test; Tobacco smoking affecting pregnancy; and Gonorrhea in pregnancy on their problem list.  Patient reports painful lump on right wrist.  Contractions: Not present. Vag. Bleeding: None.  Movement: Present. Denies leaking of fluid.   The following portions of the patient's history were reviewed and updated as appropriate: allergies, current medications, past family history, past medical history, past social history, past surgical history and problem list.   Objective:    Vitals:   07/12/24 1134  BP: 96/68  Pulse: 83  Weight: 201 lb (91.2 kg)    Fetal Status:  Fetal Heart Rate (bpm): 164 Fundal Height: 25 cm Movement: Present    General: Alert, oriented and cooperative. Patient is in no acute distress.  Skin: Skin is warm and dry. No rash noted.   Cardiovascular: Normal heart rate noted  Respiratory: Normal respiratory effort, no problems with respiration noted  Abdomen: Soft, gravid, appropriate for gestational age.  Pain/Pressure: Absent     Pelvic: Cervical exam deferred        Extremities: Normal range of motion.  Edema: Trace  Mental Status: Normal mood and affect.  Normal behavior. Normal judgment and thought content.   Assessment and Plan:  Pregnancy: G2P1001 at [redacted]w[redacted]d 1. Ganglion cyst of wrist, right (Primary) --Ice, Tylenol  PRN - AMB referral to orthopedics  2. Supervision of high risk pregnancy, antepartum --Anticipatory guidance about next visits/weeks of pregnancy given.   3. [redacted] weeks gestation of pregnancy   Preterm labor symptoms and general obstetric precautions including but not limited to vaginal bleeding, contractions, leaking of fluid and fetal movement were reviewed in detail with the patient. Please refer to After Visit Summary for other counseling recommendations.   Return in about 4 weeks (around 08/09/2024) for GTT at next visit.  Future Appointments  Date Time Provider Department Center  07/14/2024  2:00 PM [facility] NURSE [facility] None  08/02/2024 10:15 AM [facility] PROVIDER 1 [facility]  08/02/2024 10:30 AM [facility] US4 [facility]  08/09/2024 10:55 AM Constant, Peggy, MD [facility] None  08/30/2024 10:15 AM [facility] PROVIDER 1 [facility]  08/30/2024 10:30 AM [facility] US2 [facility]  09/02/2024  2:00 PM Harl Zane BRAVO, NP [facility] None  09/06/2024  8:55 AM Constant, Winton, MD [facility] None  09/27/2024 10:15 AM [facility] PROVIDER 1 [facility]  09/27/2024 10:30 AM [facility] US2 [facility]    Olam Boards, CNM

## 2024-07-12 NOTE — Progress Notes (Signed)
 Pt presents for ROB visit. Pt states iron was last week at [facility] ([facility]) and it was low. Pt also has bump on right wrist, was treatment options

## 2024-07-14 ENCOUNTER — Encounter (HOSPITAL_COMMUNITY): Payer: Self-pay

## 2024-07-14 ENCOUNTER — Ambulatory Visit (HOSPITAL_COMMUNITY): Admitting: Psychiatry

## 2024-07-14 VITALS — BP 94/67 | HR 82 | Temp 98.4°F | Ht 61.0 in | Wt 200.8 lb

## 2024-07-14 DIAGNOSIS — F411 Generalized anxiety disorder: Secondary | ICD-10-CM

## 2024-07-14 DIAGNOSIS — F331 Major depressive disorder, recurrent, moderate: Secondary | ICD-10-CM

## 2024-07-14 NOTE — Progress Notes (Cosign Needed)
 Patient came into the office today to get her abilify  maintena injection 400mg . Patient states that the abilify  is working for her she has no thoughts of hurting herself or other. She was talkative and well dressed.      Pt was given the injection in the left deltoid. Injection was prepared and given by harlene fridge, cma.   Pt tolerated injection well.   Pt to return in 28 days.        ABILIFY  MAINTENA 400mg  sample given   Lot # [id number] exp 06-14-2026

## 2024-07-14 NOTE — Patient Instructions (Signed)
 Patient came into the office today to get her abilify  maintena injection 400mg . Patient states that the abilify  is working for her she has no thoughts of hurting herself or other. She was talkative and well dressed.      Pt was given the injection in the left deltoid. Injection was prepared and given by harlene fridge, cma.   Pt tolerated injection well.   Pt to return in 28 days.        ABILIFY  MAINTENA 400mg  sample given   Lot # [id number] exp 06-14-2026

## 2024-07-22 ENCOUNTER — Inpatient Hospital Stay (HOSPITAL_COMMUNITY)
Admission: AD | Admit: 2024-07-22 | Discharge: 2024-07-22 | Disposition: A | Discharge status: Home / Self Care | Attending: Obstetrics and Gynecology | Admitting: Obstetrics and Gynecology

## 2024-07-22 ENCOUNTER — Other Ambulatory Visit: Payer: Self-pay

## 2024-07-22 ENCOUNTER — Encounter (HOSPITAL_COMMUNITY): Payer: Self-pay | Admitting: Obstetrics and Gynecology

## 2024-07-22 DIAGNOSIS — O98312 Other infections with a predominantly sexual mode of transmission complicating pregnancy, second trimester: Secondary | ICD-10-CM | POA: Diagnosis not present

## 2024-07-22 DIAGNOSIS — A5901 Trichomonal vulvovaginitis: Secondary | ICD-10-CM | POA: Diagnosis not present

## 2024-07-22 DIAGNOSIS — O26892 Other specified pregnancy related conditions, second trimester: Secondary | ICD-10-CM

## 2024-07-22 DIAGNOSIS — O23592 Infection of other part of genital tract in pregnancy, second trimester: Secondary | ICD-10-CM | POA: Diagnosis not present

## 2024-07-22 DIAGNOSIS — B3731 Acute candidiasis of vulva and vagina: Secondary | ICD-10-CM | POA: Diagnosis not present

## 2024-07-22 DIAGNOSIS — Z3A27 27 weeks gestation of pregnancy: Secondary | ICD-10-CM

## 2024-07-22 DIAGNOSIS — R109 Unspecified abdominal pain: Secondary | ICD-10-CM

## 2024-07-22 LAB — URINALYSIS, ROUTINE W REFLEX MICROSCOPIC
Bilirubin Urine: NEGATIVE
Glucose, UA: NEGATIVE mg/dL
Hgb urine dipstick: NEGATIVE
Ketones, ur: NEGATIVE mg/dL
Leukocytes,Ua: NEGATIVE
Nitrite: NEGATIVE
Protein, ur: NEGATIVE mg/dL
Specific Gravity, Urine: 1.014 (ref 1.005–1.030)
pH: 7 (ref 5.0–8.0)

## 2024-07-22 LAB — WET PREP, GENITAL
Clue Cells Wet Prep HPF POC: NONE SEEN
Sperm: NONE SEEN
WBC, Wet Prep HPF POC: >=10 — AB (ref <10)

## 2024-07-22 MED ORDER — METRONIDAZOLE 500 MG PO TABS: 500.0000 mg | ORAL_TABLET | Freq: Two times a day (BID) | ORAL | 0 refills | Status: DC

## 2024-07-22 MED ORDER — FLUCONAZOLE 150 MG PO TABS: 150.0000 mg | ORAL_TABLET | Freq: Every day | ORAL | 1 refills | Status: DC

## 2024-07-22 MED ORDER — SIMETHICONE 80 MG PO CHEW
160.0000 mg | CHEWABLE_TABLET | Freq: Once | ORAL | Status: AC
  Administered 2024-07-22: 160 mg via ORAL
  Filled 2024-07-22: qty 2

## 2024-07-22 MED ORDER — FAMOTIDINE 20 MG PO TABS
20.0000 mg | ORAL_TABLET | Freq: Once | ORAL | Status: AC
  Administered 2024-07-22: 20 mg via ORAL
  Filled 2024-07-22: qty 1

## 2024-07-22 NOTE — MAU Provider Note (Signed)
 Chief Complaint:  Back Pain and Abdominal Pain   HPI     Nicole [name] is a 33 y.o. G2P1001 at [redacted]w[redacted]d who presents to maternity admissions.via   EMS for complaints of lower abdominal pain that radiates to her lower back that began at 2 PM.  Patient reports she has not taken anything for the pain and she is not sure if it is related to some food that she had recently ate.  She offers no OB complaints and denies any vaginal bleeding, leaking fluid and reports positive fetal movement  High risk pregnancy is complicated by schizophrenia, major depressive disorder, hypothyroidism, tobacco use disorder, borderline intellectual functioning, GERD, FAS, anxiety,  schizoaffective disorder, bipolar disorder, essential hypertension, obesity, history of gestational diabetes in  pregnancy , type 2 diabetes and gonorrhea in  pregnancy   Pregnancy Course: Fem[facility]  Past Medical History:  Diagnosis Date   Anxiety    Depression    Diabetes mellitus without complication (HCC)    HSV-2 (herpes simplex virus 2) infection    Hypothyroidism 08/07/2022   Schizophrenia (HCC)    takes abilify    Tobacco use disorder 08/07/2022   OB History  Gravida Para Term Preterm AB Living  2 1 1  0 0 1  SAB IAB Ectopic Multiple Live Births  0 0 0 0 1    # Outcome Date GA Lbr Len/2nd Weight Sex Type Anes PTL Lv  2 Current           1 Term 03/25/18    F Vag-Spont   LIV   Past Surgical History:  Procedure Laterality Date   NO PAST SURGERIES     WISDOM TOOTH EXTRACTION Bilateral 2020   Family History  Problem Relation Age of Onset   Hypertension Father    Diabetes Father    Social History   Tobacco Use   Smoking status: Some Days    Current packs/day: 0.00    Average packs/day: 0.3 packs/day for 1 year (0.3 ttl pk-yrs)    Types: Cigarettes    Start date: 2021    Last attempt to quit: 02/17/2024    Years since quitting: 0.4   Smokeless tobacco: Current  Vaping Use   Vaping status: Former   Quit date:  01/13/2024   Substances: Flavoring  Substance Use Topics   Alcohol use: Not Currently    Comment: weekly   Drug use: Not Currently    Types: Marijuana, Other-see comments    Comment: Xanax   Allergies  Allergen Reactions   Apple Juice Anaphylaxis   Other Anaphylaxis and Other (See Comments)    SALMON   Watermelon Flavoring Agent (Non-Screening) Anaphylaxis   Facility-Administered Medications Prior to Admission  Medication Dose Route Frequency Provider Last Rate Last Admin   ARIPiprazole  ER (ABILIFY  MAINTENA) 400 MG prefilled syringe 400 mg  400 mg Intramuscular Q28 days    400 mg at 07/14/24 1413   Medications Prior to Admission  Medication Sig Dispense Refill Last Dose/Taking   acetaminophen -caffeine  (EXCEDRIN TENSION HEADACHE) 500-65 MG TABS per tablet Take 1 tablet by mouth every 6 (six) hours as needed. For headache. 60 tablet 0    ARIPiprazole  ER (ABILIFY  MAINTENA) 400 MG PRSY prefilled syringe Inject 400 mg into the muscle every 28 (twenty-eight) days. 1 each 11    aspirin  EC 81 MG tablet Take 1 tablet (81 mg total) by mouth daily. Swallow whole. 90 tablet 5    Blood Pressure Monitoring (BLOOD PRESSURE KIT) DEVI 1 kit by Does not  apply route as needed. 1 each 0    famotidine  (PEPCID ) 40 MG tablet Take 1 tablet (40 mg total) by mouth daily. 90 tablet 3    lamoTRIgine  (LAMICTAL ) 25 MG tablet Take 2 tablets (50 mg total) by mouth daily. 60 tablet 3    lidocaine  (XYLOCAINE ) 2 % jelly Apply 1 Application topically as needed. 30 mL 0    Prenatal 28-0.8 MG TABS Take 1 tablet by mouth daily. 90 tablet 12    promethazine  (PHENERGAN ) 25 MG tablet Take 1 tablet (25 mg total) by mouth every 6 (six) hours as needed for nausea or vomiting. 30 tablet 1    QUEtiapine  (SEROQUEL ) 50 MG tablet Take 1 tablet (50 mg total) by mouth at bedtime. 60 tablet 1    sertraline  (ZOLOFT ) 100 MG tablet Take 1 tablet (100 mg total) by mouth at bedtime. 30 tablet 3    terconazole  (TERAZOL 7 ) 0.4 % vaginal cream  Place 1 applicator vaginally at bedtime. 45 g 0    valACYclovir  (VALTREX ) 500 MG tablet Take 1 tablet (500 mg total) by mouth daily. 30 tablet 8     I have reviewed patient's Past Medical Hx, Surgical Hx, Family Hx, Social Hx, medications and allergies.   ROS  Pertinent items noted in HPI and remainder of comprehensive ROS otherwise negative.   PHYSICAL EXAM  Patient Vitals for the past 24 hrs:  BP Temp Temp src Pulse Resp SpO2 Height Weight  07/22/24 1906 103/65 -- -- 78 -- -- -- --  07/22/24 1849 102/66 98.4 F (36.9 C) Oral 83 18 99 % -- --  07/22/24 1844 -- -- -- -- -- -- 5' 1 (1.549 m) 91.7 kg    Constitutional: Well-developed, obese female in no acute distress.  Cardiovascular: normal rate & rhythm, warm and well-perfused Respiratory: normal effort, no problems with respiration noted GI: Abd soft, non-tender, gravid MS: Extremities nontender, no edema, normal ROM Neurologic: Alert and oriented x 4.  GU: no CVA tenderness Pelvic: deferred      Fetal Tracing: NST Reactive for GA  Baseline: 150 Variability: moderate Accelerations: present Decelerations: absent Toco: quite   Labs: Results for orders placed or performed during the hospital encounter of 07/22/24 (from the past 24 hours)  Urinalysis, Routine w reflex microscopic -Urine, Clean Catch     Status: None   Collection Time: 07/22/24  6:55 PM  Result Value Ref Range   Color, Urine YELLOW YELLOW   APPearance CLEAR CLEAR   Specific Gravity, Urine 1.014 1.005 - 1.030   pH 7.0 5.0 - 8.0   Glucose, UA NEGATIVE NEGATIVE mg/dL   Hgb urine dipstick NEGATIVE NEGATIVE   Bilirubin Urine NEGATIVE NEGATIVE   Ketones, ur NEGATIVE NEGATIVE mg/dL   Protein, ur NEGATIVE NEGATIVE mg/dL   Nitrite NEGATIVE NEGATIVE   Leukocytes,Ua NEGATIVE NEGATIVE  Wet prep, genital     Status: Abnormal   Collection Time: 07/22/24  7:43 PM  Result Value Ref Range   Yeast Wet Prep HPF POC PRESENT (A) NONE SEEN   Trich, Wet Prep PRESENT (A)  NONE SEEN   Clue Cells Wet Prep HPF POC NONE SEEN NONE SEEN   WBC, Wet Prep HPF POC >=10 (A) <10   Sperm NONE SEEN     Imaging:  No results found.  MDM & MAU COURSE  MDM:  HIGH  Prenatal chart reviewed Physical exam performed UA: NM  Vaginal swabs ( Wet prep c/w yeast and trichomonas- Plan for RX at discharge) GC pending at  discharge NST for GA and fetal reassurance ( Reactive for GA) Pepcid /Simethicone  for abdominal pain ( Resolved with medication)   MAU Course: Orders Placed This Encounter  Procedures   Wet prep, genital   Urinalysis, Routine w reflex microscopic -Urine, Clean Catch   Discharge patient Discharge disposition: 01-Home or Self Care; Discharge patient date: 07/22/2024   Meds ordered this encounter  Medications   famotidine  (PEPCID ) tablet 20 mg   simethicone  (MYLICON) chewable tablet 160 mg   metroNIDAZOLE  (FLAGYL ) 500 MG tablet    Sig: Take 1 tablet (500 mg total) by mouth 2 (two) times daily.    Dispense:  14 tablet    Refill:  0    Supervising Provider:   PRATT, TANYA S [[id number]]   fluconazole  (DIFLUCAN ) 150 MG tablet    Sig: Take 1 tablet (150 mg total) by mouth daily.    Dispense:  1 tablet    Refill:  1    Supervising Provider:   PRATT, TANYA S [[id number]]     I have reviewed the patient chart and performed the physical exam . I have ordered & interpreted the lab results and reviewed and interpreted the NST  Medications ordered as stated below.  A/P as described below.  Counseling and education provided and patient agreeable  with plan as described below. Verbalized understanding.    ASSESSMENT   1. Trichomonas vaginitis   2. Yeast vaginitis   3. Abdominal pain, unspecified abdominal location   4. [redacted] weeks gestation of pregnancy     PLAN  Discharge home in stable condition with return precautions.   See AVS for full description of information given to the patient including both verbal and written. Patient verbalized understanding and agrees with  the plan as described above.     Follow-up Information     [facility] for [facility] at [facility] Follow up.   Specialty: Obstetrics and Gynecology Why: If symptoms worsen or fail to resolve, As scheduled for ongoing prenatal care Contact information: [address], [address] [place]  [address] [phone]                Allergies as of 07/22/2024       Reactions   Apple Juice Anaphylaxis   Other Anaphylaxis, Other (See Comments)   SALMON   Watermelon Flavoring Agent (non-screening) Anaphylaxis        Medication List     TAKE these medications    Abilify  Maintena 400 MG Prsy prefilled syringe Generic drug: ARIPiprazole  ER Inject 400 mg into the muscle every 28 (twenty-eight) days.   acetaminophen -caffeine  500-65 MG Tabs per tablet Commonly known as: EXCEDRIN TENSION HEADACHE Take 1 tablet by mouth every 6 (six) hours as needed. For headache.   aspirin  EC 81 MG tablet Take 1 tablet (81 mg total) by mouth daily. Swallow whole.   Blood Pressure Kit Devi 1 kit by Does not apply route as needed.   famotidine  40 MG tablet Commonly known as: PEPCID  Take 1 tablet (40 mg total) by mouth daily.   fluconazole  150 MG tablet Commonly known as: Diflucan  Take 1 tablet (150 mg total) by mouth daily.   lamoTRIgine  25 MG tablet Commonly known as: LAMICTAL  Take 2 tablets (50 mg total) by mouth daily.   lidocaine  2 % jelly Commonly known as: XYLOCAINE  Apply 1 Application topically as needed.   metroNIDAZOLE  500 MG tablet Commonly known as: FLAGYL  Take 1 tablet (500 mg total) by mouth 2 (two) times daily.  Prenatal 28-0.8 MG Tabs Take 1 tablet by mouth daily.   promethazine  25 MG tablet Commonly known as: PHENERGAN  Take 1 tablet (25 mg total) by mouth every 6 (six) hours as needed for nausea or vomiting.   QUEtiapine  50 MG tablet Commonly known as: SEROquel  Take 1 tablet (50 mg total) by mouth at bedtime.   sertraline  100  MG tablet Commonly known as: ZOLOFT  Take 1 tablet (100 mg total) by mouth at bedtime.   terconazole  0.4 % vaginal cream Commonly known as: TERAZOL 7  Place 1 applicator vaginally at bedtime.   valACYclovir  500 MG tablet Commonly known as: VALTREX  Take 1 tablet (500 mg total) by mouth daily.        Olam Dalton, MSN, [facility], [facility] for [facility]    This chart was dictated using voice recognition software, Dragon. Despite the best efforts of this provider to proofread and correct errors, errors may still occur which can change documentation meaning.

## 2024-07-22 NOTE — MAU Note (Addendum)
 Nicole [name] is a 33 y.o. at [redacted]w[redacted]d here in MAU via EMS reporting: she's having lower abdomen and mid level back pain that began at 1400 this afternoon.  Reports abdominal pain is a constant aching pressure and back pain is a constant sharp pain.  Denies taking any meds to treat.  States she's unsure if pain is a result from eating chili beans.  Denies VB or LOF.  Reports +FM.  LMP: 01/13/2024 Onset of complaint: today Pain score: 8 abdomen & 9 back Vitals:   07/22/24 1849  BP: 102/66  Pulse: 83  Resp: 18  Temp: 98.4 F (36.9 C)  SpO2: 99%     FHT: 161 bpm  Lab orders placed from triage: UA

## 2024-07-23 LAB — GC/CHLAMYDIA PROBE AMP (~~LOC~~) NOT AT ARMC
Chlamydia: NEGATIVE
Comment: NEGATIVE
Comment: NORMAL
Neisseria Gonorrhea: NEGATIVE

## 2024-07-23 NOTE — Progress Notes (Signed)
 Pt attended 06/08/2024 screening event with BP of 105/82 and pt declined any SDOH needs at event.   Per chart review pt does have a PCP & is currently pregnant and actively seeing OBGYN Rick Miyamoto & Peggy Constant), insurance, and is a smoker. Pt's last appt with appt with OBGYN was 07/22/2024 and has an upcoming appt on 08/09/2024 & 09/06/2024. Pt does not indicate any SDOH needs at this time.  No additional pt f/u to be scheduled at this time per health equity protocol.

## 2024-08-01 ENCOUNTER — Inpatient Hospital Stay (HOSPITAL_COMMUNITY)
Admission: AD | Admit: 2024-08-01 | Discharge: 2024-08-01 | Disposition: A | Discharge status: Home / Self Care | Attending: Obstetrics & Gynecology | Admitting: Obstetrics & Gynecology

## 2024-08-01 ENCOUNTER — Ambulatory Visit: Payer: Self-pay | Admitting: Family Medicine

## 2024-08-01 DIAGNOSIS — R8271 Bacteriuria: Secondary | ICD-10-CM

## 2024-08-01 DIAGNOSIS — O99213 Obesity complicating pregnancy, third trimester: Secondary | ICD-10-CM | POA: Insufficient documentation

## 2024-08-01 DIAGNOSIS — O99283 Endocrine, nutritional and metabolic diseases complicating pregnancy, third trimester: Secondary | ICD-10-CM | POA: Diagnosis not present

## 2024-08-01 DIAGNOSIS — B9789 Other viral agents as the cause of diseases classified elsewhere: Secondary | ICD-10-CM | POA: Insufficient documentation

## 2024-08-01 DIAGNOSIS — O99513 Diseases of the respiratory system complicating pregnancy, third trimester: Secondary | ICD-10-CM | POA: Diagnosis not present

## 2024-08-01 DIAGNOSIS — O26893 Other specified pregnancy related conditions, third trimester: Secondary | ICD-10-CM | POA: Diagnosis present

## 2024-08-01 DIAGNOSIS — E039 Hypothyroidism, unspecified: Secondary | ICD-10-CM | POA: Insufficient documentation

## 2024-08-01 DIAGNOSIS — J069 Acute upper respiratory infection, unspecified: Secondary | ICD-10-CM | POA: Diagnosis not present

## 2024-08-01 DIAGNOSIS — O24113 Pre-existing diabetes mellitus, type 2, in pregnancy, third trimester: Secondary | ICD-10-CM | POA: Diagnosis not present

## 2024-08-01 DIAGNOSIS — F17210 Nicotine dependence, cigarettes, uncomplicated: Secondary | ICD-10-CM | POA: Diagnosis not present

## 2024-08-01 DIAGNOSIS — Z833 Family history of diabetes mellitus: Secondary | ICD-10-CM | POA: Diagnosis not present

## 2024-08-01 DIAGNOSIS — Z8249 Family history of ischemic heart disease and other diseases of the circulatory system: Secondary | ICD-10-CM | POA: Insufficient documentation

## 2024-08-01 DIAGNOSIS — O98513 Other viral diseases complicating pregnancy, third trimester: Secondary | ICD-10-CM | POA: Insufficient documentation

## 2024-08-01 DIAGNOSIS — O212 Late vomiting of pregnancy: Secondary | ICD-10-CM | POA: Insufficient documentation

## 2024-08-01 DIAGNOSIS — Z3A28 28 weeks gestation of pregnancy: Secondary | ICD-10-CM | POA: Diagnosis not present

## 2024-08-01 DIAGNOSIS — R350 Frequency of micturition: Secondary | ICD-10-CM | POA: Diagnosis not present

## 2024-08-01 DIAGNOSIS — O219 Vomiting of pregnancy, unspecified: Secondary | ICD-10-CM

## 2024-08-01 DIAGNOSIS — O10913 Unspecified pre-existing hypertension complicating pregnancy, third trimester: Secondary | ICD-10-CM | POA: Diagnosis not present

## 2024-08-01 DIAGNOSIS — O99333 Smoking (tobacco) complicating pregnancy, third trimester: Secondary | ICD-10-CM | POA: Insufficient documentation

## 2024-08-01 LAB — COMPREHENSIVE METABOLIC PANEL WITH GFR
ALT: 23 U/L (ref 0–44)
AST: 24 U/L (ref 15–41)
Albumin: 2.8 g/dL — ABNORMAL LOW (ref 3.5–5.0)
Alkaline Phosphatase: 80 U/L (ref 38–126)
Anion gap: 11 (ref 5–15)
BUN: <5 mg/dL — ABNORMAL LOW (ref 6–20)
CO2: 21 mmol/L — ABNORMAL LOW (ref 22–32)
Calcium: 8.8 mg/dL — ABNORMAL LOW (ref 8.9–10.3)
Chloride: 101 mmol/L (ref 98–111)
Creatinine, Ser: 0.64 mg/dL (ref 0.44–1.00)
GFR, Estimated: >60 mL/min (ref >60)
Glucose, Bld: 76 mg/dL (ref 70–99)
Potassium: 3.6 mmol/L (ref 3.5–5.1)
Sodium: 133 mmol/L — ABNORMAL LOW (ref 135–145)
Total Bilirubin: 0.3 mg/dL (ref 0.0–1.2)
Total Protein: 6.2 g/dL — ABNORMAL LOW (ref 6.5–8.1)

## 2024-08-01 LAB — CBC WITH DIFFERENTIAL/PLATELET
Abs Immature Granulocytes: 0.13 K/uL — ABNORMAL HIGH (ref 0.00–0.07)
Basophils Absolute: 0 K/uL (ref 0.0–0.1)
Basophils Relative: 0 %
Eosinophils Absolute: 0.2 K/uL (ref 0.0–0.5)
Eosinophils Relative: 2 %
HCT: 33.6 % — ABNORMAL LOW (ref 36.0–46.0)
Hemoglobin: 11.4 g/dL — ABNORMAL LOW (ref 12.0–15.0)
Immature Granulocytes: 1 %
Lymphocytes Relative: 17 %
Lymphs Abs: 2.2 K/uL (ref 0.7–4.0)
MCH: 31.8 pg (ref 26.0–34.0)
MCHC: 33.9 g/dL (ref 30.0–36.0)
MCV: 93.6 fL (ref 80.0–100.0)
Monocytes Absolute: 0.8 K/uL (ref 0.1–1.0)
Monocytes Relative: 6 %
Neutro Abs: 9.5 K/uL — ABNORMAL HIGH (ref 1.7–7.7)
Neutrophils Relative %: 74 %
Platelets: 220 K/uL (ref 150–400)
RBC: 3.59 MIL/uL — ABNORMAL LOW (ref 3.87–5.11)
RDW: 12.8 % (ref 11.5–15.5)
WBC: 12.8 K/uL — ABNORMAL HIGH (ref 4.0–10.5)
nRBC: 0 % (ref 0.0–0.2)

## 2024-08-01 LAB — URINALYSIS, ROUTINE W REFLEX MICROSCOPIC
Bilirubin Urine: NEGATIVE
Glucose, UA: NEGATIVE mg/dL
Hgb urine dipstick: NEGATIVE
Ketones, ur: NEGATIVE mg/dL
Nitrite: NEGATIVE
Protein, ur: NEGATIVE mg/dL
Specific Gravity, Urine: 1.012 (ref 1.005–1.030)
pH: 7 (ref 5.0–8.0)

## 2024-08-01 LAB — RESP PANEL BY RT-PCR (RSV, FLU A&B, COVID)  RVPGX2
Influenza A by PCR: NEGATIVE
Influenza B by PCR: NEGATIVE
Resp Syncytial Virus by PCR: NEGATIVE
SARS Coronavirus 2 by RT PCR: NEGATIVE

## 2024-08-01 MED ORDER — ACETAMINOPHEN-CAFFEINE 500-65 MG PO TABS
2.0000 | ORAL_TABLET | Freq: Once | ORAL | Status: AC
  Administered 2024-08-01: 2 via ORAL
  Filled 2024-08-01: qty 2

## 2024-08-01 MED ORDER — ONDANSETRON 4 MG PO TBDP
8.0000 mg | ORAL_TABLET | Freq: Once | ORAL | Status: AC
  Administered 2024-08-01: 8 mg via ORAL
  Filled 2024-08-01: qty 2

## 2024-08-01 MED ORDER — ONDANSETRON 8 MG PO TBDP
8.0000 mg | ORAL_TABLET | Freq: Three times a day (TID) | ORAL | 0 refills | Status: DC | PRN
Start: 2024-08-01 — End: 2024-08-22

## 2024-08-01 NOTE — MAU Provider Note (Signed)
 Chief Complaint:  Headache, Generalized Body Aches, Chest Pain, Emesis, and Urinary Frequency   HPI   None     Nicole [name] is a 33 y.o. G2P1001 at [redacted]w[redacted]d who presents to maternity admissions reporting nausea, headache, body aches, chest pain, sore throat, increased urinary frequency.  She reports that symptoms started 2 days ago and have been constant since then.  She reports that she has been unable to keep any fluids down.  She called 911 2 days ago and was told that she was likely dehydrated.  Denies fever, cough, nasal congestion, rhinorrhea, SOB.  Denies vaginal bleeding, leaking of fluids, contractions.  Endorses good fetal movement.  She has not taken any over-the-counter medication at home for her symptoms, states she was not sure what she could take while pregnant.  Pregnancy Course: Receives care at [facility] for [facility] . Prenatal records reviewed.  Pregnancy complicated by chronic hypertension, hypothyroidism, type 2 diabetes, mental health, obesity.  Past Medical History:  Diagnosis Date   Anxiety    Depression    Diabetes mellitus without complication (HCC)    HSV-2 (herpes simplex virus 2) infection    Hypothyroidism 08/07/2022   Schizophrenia (HCC)    takes abilify    Tobacco use disorder 08/07/2022   OB History  Gravida Para Term Preterm AB Living  2 1 1  0 0 1  SAB IAB Ectopic Multiple Live Births  0 0 0 0 1    # Outcome Date GA Lbr Len/2nd Weight Sex Type Anes PTL Lv  2 Current           1 Term 03/25/18    F Vag-Spont   LIV   Past Surgical History:  Procedure Laterality Date   NO PAST SURGERIES     WISDOM TOOTH EXTRACTION Bilateral 2020   Family History  Problem Relation Age of Onset   Hypertension Father    Diabetes Father    Social History   Tobacco Use   Smoking status: Some Days    Current packs/day: 0.00    Average packs/day: 0.3 packs/day for 1 year (0.3 ttl pk-yrs)    Types: Cigarettes    Start date: 2021    Last attempt to  quit: 02/17/2024    Years since quitting: 0.4   Smokeless tobacco: Current  Vaping Use   Vaping status: Former   Quit date: 01/13/2024   Substances: Flavoring  Substance Use Topics   Alcohol use: Not Currently    Comment: weekly   Drug use: Not Currently    Types: Marijuana, Other-see comments    Comment: Xanax   Allergies  Allergen Reactions   Apple Juice Anaphylaxis and Other (See Comments)   Other Anaphylaxis and Other (See Comments)    SALMON   Watermelon Flavoring Agent (Non-Screening) Anaphylaxis   Apple (Diagnostic)     Other Reaction(s): Unknown   Fish Oil     Other Reaction(s): Unknown   Facility-Administered Medications Prior to Admission  Medication Dose Route Frequency Provider Last Rate Last Admin   ARIPiprazole  ER (ABILIFY  MAINTENA) 400 MG prefilled syringe 400 mg  400 mg Intramuscular Q28 days    400 mg at 07/14/24 1413   Medications Prior to Admission  Medication Sig Dispense Refill Last Dose/Taking   acetaminophen -caffeine  (EXCEDRIN TENSION HEADACHE) 500-65 MG TABS per tablet Take 1 tablet by mouth every 6 (six) hours as needed. For headache. 60 tablet 0 07/31/2024   ARIPiprazole  ER (ABILIFY  MAINTENA) 400 MG PRSY prefilled syringe Inject 400 mg into  the muscle every 28 (twenty-eight) days. 1 each 11 Past Month   aspirin  EC 81 MG tablet Take 1 tablet (81 mg total) by mouth daily. Swallow whole. 90 tablet 5 07/31/2024   famotidine  (PEPCID ) 40 MG tablet Take 1 tablet (40 mg total) by mouth daily. 90 tablet 3 07/31/2024   fluconazole  (DIFLUCAN ) 150 MG tablet Take 1 tablet (150 mg total) by mouth daily. 1 tablet 1 Past Week   lamoTRIgine  (LAMICTAL ) 25 MG tablet Take 2 tablets (50 mg total) by mouth daily. 60 tablet 3 07/31/2024   metroNIDAZOLE  (FLAGYL ) 500 MG tablet Take 1 tablet (500 mg total) by mouth 2 (two) times daily. 14 tablet 0 Past Week   Prenatal 28-0.8 MG TABS Take 1 tablet by mouth daily. 90 tablet 12 07/31/2024   promethazine  (PHENERGAN ) 25 MG tablet Take 1  tablet (25 mg total) by mouth every 6 (six) hours as needed for nausea or vomiting. 30 tablet 1 07/31/2024   QUEtiapine  (SEROQUEL ) 50 MG tablet Take 1 tablet (50 mg total) by mouth at bedtime. 60 tablet 1 07/31/2024   sertraline  (ZOLOFT ) 100 MG tablet Take 1 tablet (100 mg total) by mouth at bedtime. 30 tablet 3 07/31/2024   terconazole  (TERAZOL 7 ) 0.4 % vaginal cream Place 1 applicator vaginally at bedtime. 45 g 0 Past Week   valACYclovir  (VALTREX ) 500 MG tablet Take 1 tablet (500 mg total) by mouth daily. 30 tablet 8 07/31/2024   Blood Pressure Monitoring (BLOOD PRESSURE KIT) DEVI 1 kit by Does not apply route as needed. 1 each 0 Unknown   lidocaine  (XYLOCAINE ) 2 % jelly Apply 1 Application topically as needed. 30 mL 0 Unknown    I have reviewed patient's Past Medical Hx, Surgical Hx, Family Hx, Social Hx, medications and allergies.   ROS  Pertinent items noted in HPI and remainder of comprehensive ROS otherwise negative.   PHYSICAL EXAM  Patient Vitals for the past 24 hrs:  BP Temp Temp src Pulse Resp SpO2 Height Weight  08/01/24 1315 125/82 -- -- 79 18 -- -- --  08/01/24 1245 108/69 -- -- 94 -- 98 % -- --  08/01/24 1228 101/67 -- -- 95 -- -- -- --  08/01/24 1212 106/68 98.3 F (36.8 C) Oral 89 18 100 % 5' 1 (1.549 m) 91.2 kg  08/01/24 1210 -- -- -- -- -- 100 % -- --    Constitutional: Well-developed, well-nourished female in no acute distress.  HEENT: atraumatic, normocephalic. Neck has normal ROM. EOM intact. Cardiovascular: normal rate & rhythm, warm and well-perfused Respiratory: normal effort, no problems with respiration noted GI: Abd soft, non-tender, non-distended MSK: Extremities nontender, no edema, normal ROM Skin: warm and dry. Acyanotic, no jaundice or pallor. Neurologic: Alert and oriented x 4. No abnormal coordination. Psychiatric: Normal mood. Speech not slurred, not rapid/pressured. Patient is cooperative. GU: no CVA tenderness  Fetal Tracing: Baseline FHR:  150 per minute Fetal heart variability: moderate Fetal Heart Rate accelerations: yes 10x10, appropriate for gestational age Fetal Heart Rate decelerations: none Fetal Non-stress Test: Category I (reactive) Toco: no uterine contractions   Labs: Results for orders placed or performed during the hospital encounter of 08/01/24 (from the past 24 hours)  Urinalysis, Routine w reflex microscopic -Urine, Clean Catch     Status: Abnormal   Collection Time: 08/01/24 12:16 PM  Result Value Ref Range   Color, Urine YELLOW YELLOW   APPearance HAZY (A) CLEAR   Specific Gravity, Urine 1.012 1.005 - 1.030   pH 7.0 5.0 -  8.0   Glucose, UA NEGATIVE NEGATIVE mg/dL   Hgb urine dipstick NEGATIVE NEGATIVE   Bilirubin Urine NEGATIVE NEGATIVE   Ketones, ur NEGATIVE NEGATIVE mg/dL   Protein, ur NEGATIVE NEGATIVE mg/dL   Nitrite NEGATIVE NEGATIVE   Leukocytes,Ua SMALL (A) NEGATIVE   RBC / HPF 0-5 0 - 5 RBC/hpf   WBC, UA 0-5 0 - 5 WBC/hpf   Bacteria, UA RARE (A) NONE SEEN   Squamous Epithelial / HPF 0-5 0 - 5 /HPF  CBC with Differential/Platelet     Status: Abnormal   Collection Time: 08/01/24 12:32 PM  Result Value Ref Range   WBC 12.8 (H) 4.0 - 10.5 K/uL   RBC 3.59 (L) 3.87 - 5.11 MIL/uL   Hemoglobin 11.4 (L) 12.0 - 15.0 g/dL   HCT 66.3 (L) 63.9 - 53.9 %   MCV 93.6 80.0 - 100.0 fL   MCH 31.8 26.0 - 34.0 pg   MCHC 33.9 30.0 - 36.0 g/dL   RDW 87.1 88.4 - 84.4 %   Platelets 220 150 - 400 K/uL   nRBC 0.0 0.0 - 0.2 %   Neutrophils Relative % 74 %   Neutro Abs 9.5 (H) 1.7 - 7.7 K/uL   Lymphocytes Relative 17 %   Lymphs Abs 2.2 0.7 - 4.0 K/uL   Monocytes Relative 6 %   Monocytes Absolute 0.8 0.1 - 1.0 K/uL   Eosinophils Relative 2 %   Eosinophils Absolute 0.2 0.0 - 0.5 K/uL   Basophils Relative 0 %   Basophils Absolute 0.0 0.0 - 0.1 K/uL   Immature Granulocytes 1 %   Abs Immature Granulocytes 0.13 (H) 0.00 - 0.07 K/uL  Comprehensive metabolic panel     Status: Abnormal   Collection Time:  08/01/24 12:32 PM  Result Value Ref Range   Sodium 133 (L) 135 - 145 mmol/L   Potassium 3.6 3.5 - 5.1 mmol/L   Chloride 101 98 - 111 mmol/L   CO2 21 (L) 22 - 32 mmol/L   Glucose, Bld 76 70 - 99 mg/dL   BUN <5 (L) 6 - 20 mg/dL   Creatinine, Ser 9.35 0.44 - 1.00 mg/dL   Calcium  8.8 (L) 8.9 - 10.3 mg/dL   Total Protein 6.2 (L) 6.5 - 8.1 g/dL   Albumin 2.8 (L) 3.5 - 5.0 g/dL   AST 24 15 - 41 U/L   ALT 23 0 - 44 U/L   Alkaline Phosphatase 80 38 - 126 U/L   Total Bilirubin 0.3 0.0 - 1.2 mg/dL   GFR, Estimated >39 >39 mL/min   Anion gap 11 5 - 15    Imaging:  No results found.  EKG: EKG: normal EKG, normal sinus rhythm, unchanged from previous tracings. I personally reviewed this EKG for intrepretation.  MDM & MAU COURSE  MDM: Moderate  MAU Course: -Vital signs within normal limits. Normotensive, no preeclampsia. -EKG for chest pain, no ACS or arrhythmia on EKG. -CBC for WBC and anemia. CMP for electrolytes/renal function. -UA for hydration status and to rule out infection. -Swabs for influenza, COVID, RSV as it is still the first 5 days of symptoms. -While awaiting results, Zofran  for nausea and acetaminophen -caffeine  for headache and body aches. -UA negative for infection or severe dehydration. -CBC within normal limits and stable from last check. -Nausea has improved with Zofran . -CMP within normal limits, no electrolyte abnormalities. Reassuring of hydration status. -Respiratory swabs pending, will contact with abnormal results.  Differential diagnosis considered for headache includes but is not limited to: preeclampsia,  tension headache, cluster, migraine, viral syndrome Differential diagnosis considered for chest pain includes but is not limited to: ACS, heart failure exacerbation, costochondritis, pneumonia, arrhythmia Differential diagnosis considered for urinary frequency includes but is not limited to: UTI, pyelonephritis, urinary frequency due to pregnancy   Orders  Placed This Encounter  Procedures   Resp panel by RT-PCR (RSV, Flu A&B, Covid) Anterior Nasal Swab   Culture, OB Urine   Urinalysis, Routine w reflex microscopic -Urine, Clean Catch   CBC with Differential/Platelet   Comprehensive metabolic panel   Airborne and Contact precautions   ED EKG   Discharge patient   Meds ordered this encounter  Medications   ondansetron  (ZOFRAN -ODT) disintegrating tablet 8 mg   acetaminophen -caffeine  (EXCEDRIN TENSION HEADACHE) 500-65 MG per tablet 2 tablet   ondansetron  (ZOFRAN -ODT) 8 MG disintegrating tablet    Sig: Take 1 tablet (8 mg total) by mouth every 8 (eight) hours as needed for nausea or vomiting.    Dispense:  20 tablet    Refill:  0    ASSESSMENT   1. Nausea and vomiting in pregnancy   2. Viral URI   3. [redacted] weeks gestation of pregnancy     PLAN  Discharge home in stable condition with return precautions.  Discussed management for viral URI consists of rest, increased hydration, symptomatic management with OTC medicines.  List of OTC safe medications provided. Prescription for Zofran  sent to pharmacy.    Allergies as of 08/01/2024       Reactions   Apple Juice Anaphylaxis, Other (See Comments)   Other Anaphylaxis, Other (See Comments)   SALMON   Watermelon Flavoring Agent (non-screening) Anaphylaxis   Apple (diagnostic)    Other Reaction(s): Unknown   Fish Oil    Other Reaction(s): Unknown        Medication List     TAKE these medications    Abilify  Maintena 400 MG Prsy prefilled syringe Generic drug: ARIPiprazole  ER Inject 400 mg into the muscle every 28 (twenty-eight) days.   acetaminophen -caffeine  500-65 MG Tabs per tablet Commonly known as: EXCEDRIN TENSION HEADACHE Take 1 tablet by mouth every 6 (six) hours as needed. For headache.   aspirin  EC 81 MG tablet Take 1 tablet (81 mg total) by mouth daily. Swallow whole.   Blood Pressure Kit Devi 1 kit by Does not apply route as needed.   famotidine  40 MG  tablet Commonly known as: PEPCID  Take 1 tablet (40 mg total) by mouth daily.   fluconazole  150 MG tablet Commonly known as: Diflucan  Take 1 tablet (150 mg total) by mouth daily.   lamoTRIgine  25 MG tablet Commonly known as: LAMICTAL  Take 2 tablets (50 mg total) by mouth daily.   lidocaine  2 % jelly Commonly known as: XYLOCAINE  Apply 1 Application topically as needed.   metroNIDAZOLE  500 MG tablet Commonly known as: FLAGYL  Take 1 tablet (500 mg total) by mouth 2 (two) times daily.   ondansetron  8 MG disintegrating tablet Commonly known as: ZOFRAN -ODT Take 1 tablet (8 mg total) by mouth every 8 (eight) hours as needed for nausea or vomiting.   Prenatal 28-0.8 MG Tabs Take 1 tablet by mouth daily.   promethazine  25 MG tablet Commonly known as: PHENERGAN  Take 1 tablet (25 mg total) by mouth every 6 (six) hours as needed for nausea or vomiting.   QUEtiapine  50 MG tablet Commonly known as: SEROquel  Take 1 tablet (50 mg total) by mouth at bedtime.   sertraline  100 MG tablet Commonly known as: ZOLOFT  Take 1  tablet (100 mg total) by mouth at bedtime.   terconazole  0.4 % vaginal cream Commonly known as: TERAZOL 7  Place 1 applicator vaginally at bedtime.   valACYclovir  500 MG tablet Commonly known as: VALTREX  Take 1 tablet (500 mg total) by mouth daily.        Joesph DELENA Sear, PA

## 2024-08-01 NOTE — MAU Note (Signed)
 Nicole [name] is a 33 y.o. at [redacted]w[redacted]d here in MAU reporting: don't feel good, head hurts, body aches, chest is hurting and constantly pees (no pain).  Unable to keep any fluids down (no vomiting while here).  Called 911 on Friday,  told dehydration, her BP drops when she stands up. No fever.  Denies cough or runny nose.  Denies vag bleeding or ROM.  Reports +FM  Onset of complaint: Friday Pain score: HA 7, body 9, chest 8 Vitals:   08/01/24 1210 08/01/24 1212  BP:  106/68  Pulse:  89  Resp:  18  Temp:  98.3 F (36.8 C)  SpO2: 100% 100%     FHT:160 Lab orders placed from triage:  urine

## 2024-08-01 NOTE — Discharge Instructions (Signed)
 Upper Respiratory Illness:    Aches/Pains, Fever, Headache OTC Acetaminophen  (Tylenol ) 500 mg tablets - take max 2 tablets (1000 mg) every 6 hours (4 times per day)    Sinus Congestion Prescription fluticasone  (Flonase ) as directed Nasal Saline if desired to rinse pseudoephedrine (Sudafed) -  use only after [redacted] weeks gestation and if you do not have high blood pressure  Oxymetolazone (Afrin, others) sparing use due to rebound congestion, NEVER use in kids Phenylephrine (Sudafed) 10 mg tablets every 4 hours (or the 12-hour formulation)* Diphenhydramine  (Benadryl ) 25 mg tablets - take max 2 tablets every 4 hours, can make you drowsy/sleepy cetirizine (Zyrtec) - similar to Benadryl  but does not make you sleepy Vicks Vaporub - opens up nasal passages to make breathing easier Breath right strips   Cough & Sore Throat Alcohol-free cough drops Chloraseptic throat spray Cepacol throat lozenges Cold-Eeze - up to three times per day  Dextromethorphan (Robitussin, others) - cough suppressant Guaifenesin (Robitussin, Mucinex, others) - expectorant (helps cough up mucus) Robitussin DM (plain only, alcohol-free) - includes cough suppressant! (Dextromethorphan and Guaifenesin also come in a combination tablet/syrup) Lozenges w/ Benzocaine  + Menthol  (Cepacol) Honey - as much as you want! It has some antimicrobial properties and helps soothe the throat Teas which coat the throat - look for ingredients Elm Bark, Licorice Root, Marshmallow Root Any warm beverage or soup can be soothing for sore throat   Other Zinc  Lozenges within 24 hours of symptoms onset - mixed evidence this shortens the duration of the common cold    Safe Medications in Pregnancy   Acne: Benzoyl Peroxide Salicylic Acid  Pain/Headache: Tylenol : 2 regular strength every 4 hours OR              2 Extra strength every 6 hours  Colds/Coughs/Allergies: Benadryl  (alcohol free) 25 mg every 6 hours as needed  Breath right  strips  Claritin  - good for allergies, also decreases swelling contributing to nasal congestion Cepacol throat lozenges  Chloraseptic throat spray  Cold-Eeze - up to three times per day  Cough drops, alcohol free  Flonase  (by prescription only) - opens nasal canal to make breathing easier. Especially effective for allergies. Guaifenesin (Mucinex) - thins out mucus for your body to clear faster. Be sure to drink a lot of water to avoid dry eyes, mouth, etc. Robitussin DM (plain only, alcohol free) - includes guaifenesin for mucus and a cough suppressant Saline nasal spray/drops  Sudafed (pseudoephedrine) & Actifed * use only after [redacted] weeks gestation and if you do not have high blood pressure  Tylenol  - good for aches and pains Vicks Vaporub - opens up nasal passages to make breathing easier Zinc  lozenges  Zyrtec - good for allergies, also decreases swelling contributing to nasal congestion  Constipation: Colace Ducolax suppositories Fleet enema Glycerin suppositories Metamucil Milk of magnesia Miralax  - can take once daily to start. If needed, can take twice daily. Senokot Smooth move tea  Diarrhea: Kaopectate Imodium A-D  *NO pepto Bismol  Hemorrhoids: Anusol  Anusol  HC Preparation H Tucks  Indigestion: Tums Maalox Mylanta Cimetidine (Tagamet HB)** preferred in pregnancy Famotidine  (Pepcid ) Ranitidine (Zantac)  Insomnia: Benadryl  (alcohol free) 25mg  every 6 hours as needed Tylenol  PM Unisom, no Gelcaps  Leg Cramps: Tums MagGel  Nausea/Vomiting:  Bonine Dramamine Emetrol Ginger extract Sea bands Meclizine   Nausea medication to take during pregnancy:  Unisom (doxylamine succinate 25 mg tablets) Take one tablet daily at bedtime. If symptoms are not adequately controlled, the dose can be increased to  a maximum recommended dose of two tablets daily (1/2 tablet in the morning, 1/2 tablet mid-afternoon and one at bedtime). Vitamin B6 100mg  tablets. Take one  tablet twice a day (up to 200 mg per day).  Skin Rashes: Aveeno products Benadryl  cream or 25mg  every 6 hours as needed Calamine Lotion 1% cortisone cream  Yeast infection: Gyne-lotrimin  7 Monistat 7   **If taking multiple medications, please check labels to avoid duplicating the same active ingredients **take medication as directed on the label ** Do not exceed 4000 mg of tylenol  in 24 hours **Do not take medications that contain aspirin  or ibuprofen 

## 2024-08-02 ENCOUNTER — Ambulatory Visit: Attending: Maternal & Fetal Medicine | Admitting: Obstetrics

## 2024-08-02 ENCOUNTER — Other Ambulatory Visit: Payer: Self-pay | Admitting: *Deleted

## 2024-08-02 ENCOUNTER — Ambulatory Visit

## 2024-08-02 VITALS — BP 102/50 | HR 91

## 2024-08-02 DIAGNOSIS — E119 Type 2 diabetes mellitus without complications: Secondary | ICD-10-CM

## 2024-08-02 DIAGNOSIS — O99343 Other mental disorders complicating pregnancy, third trimester: Secondary | ICD-10-CM

## 2024-08-02 DIAGNOSIS — Z362 Encounter for other antenatal screening follow-up: Secondary | ICD-10-CM | POA: Insufficient documentation

## 2024-08-02 DIAGNOSIS — O99210 Obesity complicating pregnancy, unspecified trimester: Secondary | ICD-10-CM

## 2024-08-02 DIAGNOSIS — O24113 Pre-existing diabetes mellitus, type 2, in pregnancy, third trimester: Secondary | ICD-10-CM | POA: Insufficient documentation

## 2024-08-02 DIAGNOSIS — R8271 Bacteriuria: Secondary | ICD-10-CM | POA: Insufficient documentation

## 2024-08-02 DIAGNOSIS — O10913 Unspecified pre-existing hypertension complicating pregnancy, third trimester: Secondary | ICD-10-CM | POA: Diagnosis not present

## 2024-08-02 DIAGNOSIS — O99213 Obesity complicating pregnancy, third trimester: Secondary | ICD-10-CM | POA: Diagnosis present

## 2024-08-02 DIAGNOSIS — O99333 Smoking (tobacco) complicating pregnancy, third trimester: Secondary | ICD-10-CM

## 2024-08-02 DIAGNOSIS — I10 Essential (primary) hypertension: Secondary | ICD-10-CM

## 2024-08-02 DIAGNOSIS — O99283 Endocrine, nutritional and metabolic diseases complicating pregnancy, third trimester: Secondary | ICD-10-CM

## 2024-08-02 DIAGNOSIS — Z3A28 28 weeks gestation of pregnancy: Secondary | ICD-10-CM | POA: Insufficient documentation

## 2024-08-02 DIAGNOSIS — F17210 Nicotine dependence, cigarettes, uncomplicated: Secondary | ICD-10-CM

## 2024-08-02 DIAGNOSIS — F319 Bipolar disorder, unspecified: Secondary | ICD-10-CM

## 2024-08-02 DIAGNOSIS — O0990 Supervision of high risk pregnancy, unspecified, unspecified trimester: Secondary | ICD-10-CM

## 2024-08-02 DIAGNOSIS — Z3A29 29 weeks gestation of pregnancy: Secondary | ICD-10-CM

## 2024-08-02 DIAGNOSIS — E039 Hypothyroidism, unspecified: Secondary | ICD-10-CM

## 2024-08-02 DIAGNOSIS — O10912 Unspecified pre-existing hypertension complicating pregnancy, second trimester: Secondary | ICD-10-CM

## 2024-08-02 DIAGNOSIS — O24112 Pre-existing diabetes mellitus, type 2, in pregnancy, second trimester: Secondary | ICD-10-CM

## 2024-08-02 LAB — CULTURE, OB URINE: Culture: 2000 — AB

## 2024-08-02 MED ORDER — CEFADROXIL 500 MG PO CAPS: 500.0000 mg | ORAL_CAPSULE | Freq: Two times a day (BID) | ORAL | 0 refills | Status: AC

## 2024-08-02 NOTE — Progress Notes (Signed)
 MFM Consult Note  Nicole [name] is currently at 28 weeks and 6 days.  She has been followed due to maternal obesity with a BMI of 37.6 and pregestational diabetes.  The patient reports that she has not been monitoring her fingerstick values as she does not have a glucometer.  Sonographic findings Single intrauterine pregnancy at 28w 6d.  Fetal cardiac activity:  Observed and appears normal. Presentation: Cephalic. Fetal biometry shows the estimated fetal weight of 2 pounds 11 ounces which measures at the 22nd percentile. Amniotic fluid volume: Within normal limits. AFI 18.04 cm.MVP: 5.06 cm. Placenta: Anterior.  There are limitations of prenatal ultrasound such as the inability to detect certain abnormalities due to poor visualization. Various factors such as fetal position, gestational age and maternal body habitus may increase the difficulty in visualizing the fetal anatomy.    Pregestational diabetes I sent a message to the providers at [facility] to prescribe a glucometer and testing supplies for her so that she can start monitoring her fingerstick values.  She was advised that once she gets her diabetic testing supplies, she should monitor her fingersticks 4 times daily (fasting and 2 hours after each meal).    She was advised that our goals for her fingerstick values are fasting values of 90-95 or less and two-hour postprandial values of 120 or less.    Should the majority of her fingerstick results be above these values, she may have to be started on insulin or metformin  to help her achieve better glycemic control.   The increased risk of polyhydramnios, fetal macrosomia, and preeclampsia associated with diabetes was also discussed.    We will continue to follow her with monthly growth ultrasounds.    Due to pregestational diabetes, we will start weekly fetal testing at 32 weeks.  The patient was advised that delivery for well-controlled diabetes in pregnancy is usually recommended  at around 39 weeks.    Delivery at 37 weeks may be considered should her glycemic control be poor or if she remains noncompliant.  She already has a another growth scan and BPP scheduled in 4 weeks.    The patient stated that all of her questions were answered today.  A total of 20 minutes was spent counseling and coordinating the care for this patient.  Greater than 50% of the time was spent in direct face-to-face contact.

## 2024-08-03 ENCOUNTER — Other Ambulatory Visit: Payer: Self-pay

## 2024-08-03 DIAGNOSIS — R8271 Bacteriuria: Secondary | ICD-10-CM

## 2024-08-03 MED ORDER — ACCU-CHEK GUIDE TEST VI STRP: ORAL_STRIP | 12 refills | Status: DC

## 2024-08-03 MED ORDER — ACCU-CHEK SOFTCLIX LANCETS MISC: 12 refills | Status: DC

## 2024-08-03 MED ORDER — ACCU-CHEK GUIDE W/DEVICE KIT: 1.0000 | PACK | Freq: Four times a day (QID) | 0 refills | Status: DC

## 2024-08-04 ENCOUNTER — Telehealth (INDEPENDENT_AMBULATORY_CARE_PROVIDER_SITE_OTHER): Admitting: Psychiatry

## 2024-08-04 ENCOUNTER — Encounter (HOSPITAL_COMMUNITY): Payer: Self-pay | Admitting: Psychiatry

## 2024-08-04 DIAGNOSIS — F250 Schizoaffective disorder, bipolar type: Secondary | ICD-10-CM | POA: Diagnosis not present

## 2024-08-04 DIAGNOSIS — F411 Generalized anxiety disorder: Secondary | ICD-10-CM

## 2024-08-04 MED ORDER — ABILIFY MAINTENA 400 MG IM PRSY: 400.0000 mg | PREFILLED_SYRINGE | INTRAMUSCULAR | 11 refills | Status: DC

## 2024-08-04 MED ORDER — LAMOTRIGINE 25 MG PO TABS: 50.0000 mg | ORAL_TABLET | Freq: Every day | ORAL | 3 refills | Status: DC

## 2024-08-04 MED ORDER — QUETIAPINE FUMARATE 50 MG PO TABS: 50.0000 mg | ORAL_TABLET | Freq: Every day | ORAL | 1 refills | Status: DC

## 2024-08-04 MED ORDER — SERTRALINE HCL 50 MG PO TABS: 150.0000 mg | ORAL_TABLET | Freq: Every day | ORAL | 3 refills | Status: DC

## 2024-08-04 MED ORDER — HYDROXYZINE HCL 10 MG PO TABS: 10.0000 mg | ORAL_TABLET | Freq: Three times a day (TID) | ORAL | 3 refills | Status: DC | PRN

## 2024-08-04 NOTE — Progress Notes (Signed)
 [facility] MD/PA/NP OP Progress Note Virtual Visit via Video Note  I connected with Nicole [name] on 08/04/24 at  8:30 AM EDT by a video enabled telemedicine application and verified that I am speaking with the correct person using two identifiers.  Location: Patient: Home Provider: Clinic   I discussed the limitations of evaluation and management by telemedicine and the availability of in person appointments. The patient expressed understanding and agreed to proceed.  I provided 30 minutes of non-face-to-face time during this encounter.     08/04/2024 9:02 AM Nicole [name]  MRN:  [id number]  Chief Complaint: I need need medications for the day as I am struggling  HPI: 33 year old female seen today for follow-up psychiatric evaluation.  She has a psychiatric history of schizophrenia, depression, anxiety, insomnia, tobacco use, and borderline intellectual functioning. She is currently managed on Abilify  maintainer 400 mg every 28 days,trazodone  100 mg nightly as needed, Seroquel  50 mg nightly, Zoloft  100 mg daily, and Lamictal  50 mg daily.  She inform her that her medications are effective in managing her psychiatric condition.  Today she is well-groomed, pleasant, cooperative, and engaged in conversation.  Patient informed writer that she needs medications in the day time to help mange her irritability and anxiety.  She reports that she is struggling, constantly feels on edge, and believes something bad is going to happen.  Patient does note that she is somewhat concerned about her pregnancy and delivering her daughter in 109 weeks.  She is currently 7 months pregnant.  She reports that her pregnancy is going well.  Provider discussed the risk and benefits of patient being on 2 antipsychotics and other psychiatric medications while pregnant.  She endorsed understanding however reports that she understands the risk and believes that it would be beneficial for her.  Provider endorsed  understanding.   Today provider conducted GAD-7 and patient scored 20, at her last visit she scored a 7. Provider also conducted PHQ-9 the patient scored a 23, at her last visit she scored a 3.  Patient endorsed adequate sleep (6 hours) and appetite.  Patient endorses passive SI but denies wanting to harm self.  Today she denies SI/HI/AVH or mania.   Patient denies alcohol or illegal drug use.  She reports that she spends most days at her day program ([facility]).    Patient feels that the benefits outweigh the risks of being on antipsychotics and other psychiatric medications.  Today she is agreeable to increasing Zoloft  100 mg to 150 mg to help manage anxiety and depression.  He is also agreeable to starting hydroxyzine  10 mg 3 times daily as needed to help manage anxiety.  No other medication changes made today.  Patient agreeable to taking medication as prescribed.  Potential side effects of medication and risks vs benefits of treatment vs non-treatment were explained and discussed. All questions were answered.No other concerns noted at this time.  Visit Diagnosis:    ICD-10-CM   1. Anxiety state  F41.1 sertraline  (ZOLOFT ) 50 MG tablet    hydrOXYzine  (ATARAX ) 10 MG tablet    2. Schizoaffective disorder, bipolar type (HCC)  F25.0 QUEtiapine  (SEROQUEL ) 50 MG tablet    lamoTRIgine  (LAMICTAL ) 25 MG tablet    ARIPiprazole  ER (ABILIFY  MAINTENA) 400 MG PRSY prefilled syringe           Past Psychiatric History: Schizophrenia (unspecified type), Major depressive disorder, Patient reports that she has been hospitalized due to mental health multiple times.  Patient states that she has done  stints at [facility] and [facility]  Past Medical History:  Past Medical History:  Diagnosis Date   Anxiety    Depression    Diabetes mellitus without complication (HCC)    HSV-2 (herpes simplex virus 2) infection    Hypothyroidism 08/07/2022   Schizophrenia (HCC)    takes abilify    Tobacco use disorder  08/07/2022    Past Surgical History:  Procedure Laterality Date   NO PAST SURGERIES     WISDOM TOOTH EXTRACTION Bilateral 2020    Family Psychiatric History: mother attempted suicide, Patient reports that her brother used to abuse marijuana and alcohol. Patient states that her mother used to abuse marijuana and alcohol.   Family History:  Family History  Problem Relation Age of Onset   Hypertension Father    Diabetes Father     Social History:  Social History   Socioeconomic History   Marital status: Single    Spouse name: Not on file   Number of children: Not on file   Years of education: Not on file   Highest education level: Not on file  Occupational History   Not on file  Tobacco Use   Smoking status: Some Days    Current packs/day: 0.00    Average packs/day: 0.3 packs/day for 1 year (0.3 ttl pk-yrs)    Types: Cigarettes    Start date: 2021    Last attempt to quit: 02/17/2024    Years since quitting: 0.4   Smokeless tobacco: Current  Vaping Use   Vaping status: Former   Quit date: 01/13/2024   Substances: Flavoring  Substance and Sexual Activity   Alcohol use: Not Currently    Comment: weekly   Drug use: Not Currently    Types: Marijuana, Other-see comments    Comment: Xanax   Sexual activity: Not Currently    Partners: Female  Other Topics Concern   Not on file  Social History Narrative   Not on file   Social Drivers of Health   Financial Resource Strain: Low Risk  (12/13/2022)   Overall Financial Resource Strain (CARDIA)    Difficulty of Paying Living Expenses: Not hard at all  Food Insecurity: No Food Insecurity (06/20/2024)   Hunger Vital Sign    Worried About Running Out of Food in the Last Year: Never true    Ran Out of Food in the Last Year: Never true  Transportation Needs: No Transportation Needs (06/20/2024)   PRAPARE - Administrator, Civil Service (Medical): No    Lack of Transportation (Non-Medical): No  Physical Activity: Inactive  (12/13/2022)   Exercise Vital Sign    Days of Exercise per Week: 0 days    Minutes of Exercise per Session: 0 min  Stress: Stress Concern Present (12/13/2022)   [employer] of Occupational Health - Occupational Stress Questionnaire    Feeling of Stress : Very much  Social Connections: Socially Integrated (12/13/2022)   Social Connection and Isolation Panel    Frequency of Communication with Friends and Family: More than three times a week    Frequency of Social Gatherings with Friends and Family: Three times a week    Attends Religious Services: More than 4 times per year    Active Member of Clubs or Organizations: Yes    Attends Banker Meetings: More than 4 times per year    Marital Status: Living with partner    Allergies:  Allergies  Allergen Reactions   Apple Juice Anaphylaxis and Other (See  Comments)   Other Anaphylaxis and Other (See Comments)    SALMON   Watermelon Flavoring Agent (Non-Screening) Anaphylaxis   Apple (Diagnostic)     Other Reaction(s): Unknown   Fish Oil     Other Reaction(s): Unknown    Metabolic Disorder Labs: Lab Results  Component Value Date   HGBA1C 5.4 04/06/2024   MPG 99.67 01/24/2024   MPG 117 10/15/2022   Lab Results  Component Value Date   PROLACTIN 2.1 (L) 01/24/2024   PROLACTIN 0.6 (L) 01/14/2024   Lab Results  Component Value Date   CHOL 165 01/24/2024   TRIG 49 01/24/2024   HDL 53 01/24/2024   CHOLHDL 3.1 01/24/2024   VLDL 10 01/24/2024   LDLCALC 102 (H) 01/24/2024   LDLCALC 112 (H) 07/25/2022   Lab Results  Component Value Date   TSH 2.550 01/24/2024   TSH 0.793 09/05/2022    Therapeutic Level Labs: No results found for: LITHIUM No results found for: VALPROATE No results found for: CBMZ  Current Medications: Current Outpatient Medications  Medication Sig Dispense Refill   hydrOXYzine  (ATARAX ) 10 MG tablet Take 1 tablet (10 mg total) by mouth 3 (three) times daily as needed. 90 tablet 3    Accu-Chek Softclix Lancets lancets Use as instructed 100 each 12   acetaminophen -caffeine  (EXCEDRIN TENSION HEADACHE) 500-65 MG TABS per tablet Take 1 tablet by mouth every 6 (six) hours as needed. For headache. 60 tablet 0   ARIPiprazole  ER (ABILIFY  MAINTENA) 400 MG PRSY prefilled syringe Inject 400 mg into the muscle every 28 (twenty-eight) days. 3 each 11   aspirin  EC 81 MG tablet Take 1 tablet (81 mg total) by mouth daily. Swallow whole. 90 tablet 5   Blood Glucose Monitoring Suppl (ACCU-CHEK GUIDE) w/Device KIT 1 Device by Does not apply route 4 (four) times daily. 1 kit 0   Blood Pressure Monitoring (BLOOD PRESSURE KIT) DEVI 1 kit by Does not apply route as needed. 1 each 0   cefadroxil  (DURICEF) 500 MG capsule Take 1 capsule (500 mg total) by mouth 2 (two) times daily for 7 days. 14 capsule 0   famotidine  (PEPCID ) 40 MG tablet Take 1 tablet (40 mg total) by mouth daily. 90 tablet 3   fluconazole  (DIFLUCAN ) 150 MG tablet Take 1 tablet (150 mg total) by mouth daily. 1 tablet 1   glucose blood (ACCU-CHEK GUIDE TEST) test strip Use as instructed 100 each 12   lamoTRIgine  (LAMICTAL ) 25 MG tablet Take 2 tablets (50 mg total) by mouth daily. 60 tablet 3   lidocaine  (XYLOCAINE ) 2 % jelly Apply 1 Application topically as needed. 30 mL 0   metroNIDAZOLE  (FLAGYL ) 500 MG tablet Take 1 tablet (500 mg total) by mouth 2 (two) times daily. 14 tablet 0   ondansetron  (ZOFRAN -ODT) 8 MG disintegrating tablet Take 1 tablet (8 mg total) by mouth every 8 (eight) hours as needed for nausea or vomiting. 20 tablet 0   Prenatal 28-0.8 MG TABS Take 1 tablet by mouth daily. 90 tablet 12   promethazine  (PHENERGAN ) 25 MG tablet Take 1 tablet (25 mg total) by mouth every 6 (six) hours as needed for nausea or vomiting. 30 tablet 1   QUEtiapine  (SEROQUEL ) 50 MG tablet Take 1 tablet (50 mg total) by mouth at bedtime. 60 tablet 1   sertraline  (ZOLOFT ) 50 MG tablet Take 3 tablets (150 mg total) by mouth at bedtime. 90 tablet 3    terconazole  (TERAZOL 7 ) 0.4 % vaginal cream Place 1 applicator vaginally at  bedtime. 45 g 0   valACYclovir  (VALTREX ) 500 MG tablet Take 1 tablet (500 mg total) by mouth daily. 30 tablet 8   Current Facility-Administered Medications  Medication Dose Route Frequency Provider Last Rate Last Admin   ARIPiprazole  ER (ABILIFY  MAINTENA) 400 MG prefilled syringe 400 mg  400 mg Intramuscular Q28 days    400 mg at 07/14/24 1413     Musculoskeletal: Strength & Muscle Tone: within normal limits Gait & Station: normal Patient leans: N/A  Psychiatric Specialty Exam: Review of Systems  Last menstrual period 01/13/2024.There is no height or weight on file to calculate BMI.  General Appearance: Well Groomed  Eye Contact:  Good  Speech:  Clear and Coherent and Slow  Volume:  Normal  Mood:  Anxious and Depressed,   Affect:  Appropriate and Congruent  Thought Process:  Coherent, Goal Directed, and Linear  Orientation:  Full (Time, Place, and Person)  Thought Content: Logical and Paranoid Ideation   Suicidal Thoughts:  No  Homicidal Thoughts:  No  Memory:  Immediate;   Good Recent;   Good Remote;   Good  Judgement:  Good  Insight:  Good  Psychomotor Activity:  Normal  Concentration:  Concentration: Good and Attention Span: Good  Recall:  Good  Fund of Knowledge: Good  Language: Good  Akathisia:  No  Handed:  Right  AIMS (if indicated):  not done,  Assets:  Communication Skills Desire for Improvement Housing Leisure Time Physical Health Social Support  ADL's:  Intact  Cognition: WNL  Sleep:  Good   Screenings: AIMS    Flowsheet Row Office Visit from 03/16/2024 in [facility] Office Visit from 01/30/2023 in [facility]  AIMS Total Score 0 2   GAD-7    Flowsheet Row Video Visit from 08/04/2024 in [facility] Video Visit from 07/08/2024 in [facility] Initial Prenatal  from 04/06/2024 in [facility] [place] Office Visit from 03/16/2024 in [facility] Clinical Support from 02/18/2024 in [facility] for [facility] at [place]  Total GAD-7 Score 20 7 7 14  0   PHQ2-9    Flowsheet Row Video Visit from 08/04/2024 in [facility] Video Visit from 07/08/2024 in [facility] Initial Prenatal from 04/06/2024 in [facility] for [facility] [place] Office Visit from 03/16/2024 in [facility] Clinical Support from 02/18/2024 in [facility] Total Score 5 0 0 0 0  PHQ-9 Total Score 23 3 0 3 0   Flowsheet Row Video Visit from 08/04/2024 in [facility] Admission (Discharged) from 08/01/2024 in [place] 1S Maternity Assessment Unit Admission (Discharged) from 07/22/2024 in [facility] 1S Maternity Assessment Unit  C-SSRS RISK CATEGORY Error: Q7 should not be populated when Q6 is No No Risk No Risk     Assessment and Plan: Patient endorses increased anxiety and depression.  She informed Clinical research associate that she has a constant fear that something bad is going to happen. Patient feels that the benefits outweigh the risks of being on antipsychotics and other psychiatric medications.  Today she is agreeable to increasing Zoloft  100 mg to 150 mg to help manage anxiety and depression.  He is also agreeable to starting hydroxyzine  10 mg 3 times daily as needed to help manage anxiety.  No other medication changes made today.  Patient agreeable to taking medication as prescribed.     1. Anxiety state  Increased- sertraline  (ZOLOFT ) 50 MG tablet; Take 3 tablets (150 mg total) by mouth at bedtime.  Dispense: 90 tablet; Refill: 3 Start- hydrOXYzine  (ATARAX ) 10 MG tablet; Take 1 tablet (10 mg total) by mouth 3 (three) times daily as needed.  Dispense: 90 tablet; Refill: 3  2.  Schizoaffective disorder, bipolar type (HCC)  Continue- QUEtiapine  (SEROQUEL ) 50 MG tablet; Take 1 tablet (50 mg total) by mouth at bedtime.  Dispense: 60 tablet; Refill: 1 Continue- lamoTRIgine  (LAMICTAL ) 25 MG tablet; Take 2 tablets (50 mg total) by mouth daily.  Dispense: 60 tablet; Refill: 3 Continue- ARIPiprazole  ER (ABILIFY  MAINTENA) 400 MG PRSY prefilled syringe; Inject 400 mg into the muscle every 28 (twenty-eight) days.  Dispense: 3 each; Refill: 11    Collaboration of Care: Collaboration of Care: Other provider involved in patient's care AEB counselor, PCP, shock clinic staff  Patient/Guardian was advised Release of Information must be obtained prior to any record release in order to collaborate their care with an outside provider. Patient/Guardian was advised if they have not already done so to contact the registration department to sign all necessary forms in order for us  to release information regarding their care.   Consent: Patient/Guardian gives verbal consent for treatment and assignment of benefits for services provided during this visit. Patient/Guardian expressed understanding and agreed to proceed.   Follow-up in 2 months Follow-up with shot clinic in 1 month Zane FORBES Bach, NP 08/04/2024, 9:02 AM

## 2024-08-09 ENCOUNTER — Ambulatory Visit (INDEPENDENT_AMBULATORY_CARE_PROVIDER_SITE_OTHER): Admitting: Obstetrics and Gynecology

## 2024-08-09 ENCOUNTER — Other Ambulatory Visit

## 2024-08-09 ENCOUNTER — Encounter: Payer: Self-pay | Admitting: Obstetrics and Gynecology

## 2024-08-09 VITALS — BP 96/67 | HR 86 | Wt 201.0 lb

## 2024-08-09 DIAGNOSIS — Z3A30 30 weeks gestation of pregnancy: Secondary | ICD-10-CM | POA: Diagnosis not present

## 2024-08-09 DIAGNOSIS — E039 Hypothyroidism, unspecified: Secondary | ICD-10-CM

## 2024-08-09 DIAGNOSIS — O24112 Pre-existing diabetes mellitus, type 2, in pregnancy, second trimester: Secondary | ICD-10-CM

## 2024-08-09 DIAGNOSIS — I10 Essential (primary) hypertension: Secondary | ICD-10-CM

## 2024-08-09 DIAGNOSIS — O0990 Supervision of high risk pregnancy, unspecified, unspecified trimester: Secondary | ICD-10-CM | POA: Diagnosis not present

## 2024-08-09 NOTE — Progress Notes (Signed)
 PRENATAL VISIT NOTE  Subjective:  Nicole [name] is a 33 y.o. G2P1001 at [redacted]w[redacted]d being seen today for ongoing prenatal care.  She is currently monitored for the following issues for this high-risk pregnancy and has Schizophrenia, unspecified (HCC); MDD (major depressive disorder), recurrent episode, moderate (HCC); Hypothyroidism; Tobacco use disorder; Borderline intellectual functioning; FAS (fetal alcohol syndrome); Anxiety state; Insomnia due to other mental disorder; Schizoaffective disorder, bipolar type (HCC); Essential hypertension; Type 2 diabetes mellitus affecting pregnancy in second trimester, antepartum; Supervision of high risk pregnancy, antepartum; Class 1 obesity due to excess calories with serious comorbidity and body mass index (BMI) of 33.0 to 33.9 in adult; History of gestational diabetes mellitus (GDM) in prior pregnancy, currently pregnant; Abnormal thyroid  function test; Tobacco smoking affecting pregnancy; Gonorrhea in pregnancy; and GBS bacteriuria on their problem list.  Patient reports no complaints.  Contractions: Not present. Vag. Bleeding: None.  Movement: Present. Denies leaking of fluid.   The following portions of the patient's history were reviewed and updated as appropriate: allergies, current medications, past family history, past medical history, past social history, past surgical history and problem list.   Objective:    Vitals:   08/09/24 0957  BP: 96/67  Pulse: 86  Weight: 201 lb (91.2 kg)    Fetal Status:  Fetal Heart Rate (bpm): 154 Fundal Height: 30 cm Movement: Present    General: Alert, oriented and cooperative. Patient is in no acute distress.  Skin: Skin is warm and dry. No rash noted.   Cardiovascular: Normal heart rate noted  Respiratory: Normal respiratory effort, no problems with respiration noted  Abdomen: Soft, gravid, appropriate for gestational age.  Pain/Pressure: Absent     Pelvic: Cervical exam deferred        Extremities:  Normal range of motion.  Edema: None  Mental Status: Normal mood and affect. Normal behavior. Normal judgment and thought content.   Assessment and Plan:  Pregnancy: G2P1001 at [redacted]w[redacted]d 1. Supervision of high risk pregnancy, antepartum (Primary) Patient is doing well without complaints Third trimester labs today - Tdap vaccine greater than or equal to 7yo IM - TSH - CBC/D/Plt+RPR+Rh+ABO+RubIgG...  2. [redacted] weeks gestation of pregnancy  3. Type 2 diabetes mellitus affecting pregnancy in second trimester, antepartum CBGs reports fasting as high 60's and pp in the 90's Will continue diet control Follow up growth 11/17  4. Hypothyroidism, unspecified type Not currently on medication TSH today  5. Essential hypertension BP stable without medication  Preterm labor symptoms and general obstetric precautions including but not limited to vaginal bleeding, contractions, leaking of fluid and fetal movement were reviewed in detail with the patient. Please refer to After Visit Summary for other counseling recommendations.   Return in about 2 weeks (around 08/23/2024) for in person, ROB, High risk.  Future Appointments  Date Time Provider Department Center  08/09/2024 10:55 AM Richar Dunklee, [place], MD [facility] None  08/11/2024  1:30 PM [facility] NURSE [facility] None  08/30/2024 10:15 AM [facility] PROVIDER 1 [facility]  08/30/2024 10:30 AM [facility] US2 [facility]  09/02/2024  2:00 PM Harl Zane BRAVO, NP [facility] None  09/02/2024  3:30 PM Harl Zane BRAVO, NP [facility] None  09/06/2024  8:55 AM Raha Tennison, [place], MD [facility] None  09/14/2024  9:35 AM Radhika Dershem, [place], MD [facility] None  09/21/2024  9:35 AM Erik Kieth BROCKS, MD [facility] None  09/27/2024 10:15 AM [facility] PROVIDER 1 [facility]  09/27/2024 10:30 AM [facility] US2 [facility]  10/05/2024  9:15 AM Erik Kieth BROCKS,  MD [facility] None    Winton Felt, MD

## 2024-08-09 NOTE — Progress Notes (Signed)
 ROB/GTT.  TDAP given in LD, tolerated well. Fasting BS 60, postprandial 98.

## 2024-08-10 LAB — CBC/D/PLT+RPR+RH+ABO+RUBIGG...
Antibody Screen: NEGATIVE
Basophils Absolute: 0 x10E3/uL (ref 0.0–0.2)
Basos: 0 %
EOS (ABSOLUTE): 0.1 x10E3/uL (ref 0.0–0.4)
Eos: 1 %
HCV Ab: NONREACTIVE
HIV Screen 4th Generation wRfx: NONREACTIVE
Hematocrit: 34.5 % (ref 34.0–46.6)
Hemoglobin: 11.5 g/dL (ref 11.1–15.9)
Hepatitis B Surface Ag: NEGATIVE
Immature Grans (Abs): 0.1 x10E3/uL (ref 0.0–0.1)
Immature Granulocytes: 1 %
Lymphocytes Absolute: 2.1 x10E3/uL (ref 0.7–3.1)
Lymphs: 20 %
MCH: 31.9 pg (ref 26.6–33.0)
MCHC: 33.3 g/dL (ref 31.5–35.7)
MCV: 96 fL (ref 79–97)
Monocytes Absolute: 0.4 x10E3/uL (ref 0.1–0.9)
Monocytes: 4 %
Neutrophils Absolute: 7.6 x10E3/uL — ABNORMAL HIGH (ref 1.4–7.0)
Neutrophils: 74 %
Platelets: 232 x10E3/uL (ref 150–450)
RBC: 3.61 x10E6/uL — ABNORMAL LOW (ref 3.77–5.28)
RDW: 12.8 % (ref 11.7–15.4)
RPR Ser Ql: NONREACTIVE
Rh Factor: POSITIVE
Rubella Antibodies, IGG: 1.08 {index} (ref >0.99)
WBC: 10.2 x10E3/uL (ref 3.4–10.8)

## 2024-08-10 LAB — TSH: TSH: 0.891 u[IU]/mL (ref 0.450–4.500)

## 2024-08-10 LAB — HCV INTERPRETATION

## 2024-08-11 ENCOUNTER — Ambulatory Visit (INDEPENDENT_AMBULATORY_CARE_PROVIDER_SITE_OTHER)

## 2024-08-11 ENCOUNTER — Encounter (HOSPITAL_COMMUNITY): Payer: Self-pay

## 2024-08-11 VITALS — BP 105/68 | HR 95 | Resp 17 | Ht 61.0 in | Wt 202.0 lb

## 2024-08-11 DIAGNOSIS — F250 Schizoaffective disorder, bipolar type: Secondary | ICD-10-CM | POA: Diagnosis not present

## 2024-08-11 NOTE — Progress Notes (Cosign Needed)
 Patient presented to office for injection of Abilify  Maintena 400 mg. Patient received injection in LEFT deltoid, per patient request. Patient tolerated injection well. Patient denies SI/HI/AVH. Patient denies any concerns or questions for provider. Patient will return in 28 days. Patient left alert and ambulatory.

## 2024-08-14 ENCOUNTER — Other Ambulatory Visit: Payer: Self-pay

## 2024-08-14 ENCOUNTER — Inpatient Hospital Stay (HOSPITAL_COMMUNITY)
Admission: AD | Admit: 2024-08-14 | Discharge: 2024-08-14 | Disposition: A | Discharge status: Home / Self Care | Attending: Obstetrics and Gynecology | Admitting: Obstetrics and Gynecology

## 2024-08-14 ENCOUNTER — Encounter (HOSPITAL_COMMUNITY): Payer: Self-pay | Admitting: Obstetrics and Gynecology

## 2024-08-14 DIAGNOSIS — R102 Pelvic and perineal pain unspecified side: Secondary | ICD-10-CM

## 2024-08-14 DIAGNOSIS — M545 Low back pain, unspecified: Secondary | ICD-10-CM | POA: Insufficient documentation

## 2024-08-14 DIAGNOSIS — O26893 Other specified pregnancy related conditions, third trimester: Secondary | ICD-10-CM | POA: Diagnosis present

## 2024-08-14 DIAGNOSIS — M549 Dorsalgia, unspecified: Secondary | ICD-10-CM | POA: Diagnosis not present

## 2024-08-14 DIAGNOSIS — Z3A30 30 weeks gestation of pregnancy: Secondary | ICD-10-CM

## 2024-08-14 LAB — URINALYSIS, ROUTINE W REFLEX MICROSCOPIC
Bilirubin Urine: NEGATIVE
Glucose, UA: NEGATIVE mg/dL
Hgb urine dipstick: NEGATIVE
Ketones, ur: NEGATIVE mg/dL
Leukocytes,Ua: NEGATIVE
Nitrite: NEGATIVE
Protein, ur: NEGATIVE mg/dL
Specific Gravity, Urine: 1.015 (ref 1.005–1.030)
pH: 7 (ref 5.0–8.0)

## 2024-08-14 MED ORDER — ACETAMINOPHEN 500 MG PO TABS
1000.0000 mg | ORAL_TABLET | Freq: Once | ORAL | Status: AC
  Administered 2024-08-14: 1000 mg via ORAL
  Filled 2024-08-14: qty 2

## 2024-08-14 MED ORDER — CYCLOBENZAPRINE HCL 5 MG PO TABS
10.0000 mg | ORAL_TABLET | Freq: Once | ORAL | Status: AC
  Administered 2024-08-14: 10 mg via ORAL
  Filled 2024-08-14: qty 2

## 2024-08-14 MED ORDER — CYCLOBENZAPRINE HCL 5 MG PO TABS: 5.0000 mg | ORAL_TABLET | Freq: Three times a day (TID) | ORAL | 0 refills | Status: DC | PRN

## 2024-08-14 NOTE — MAU Note (Signed)
 Nicole [name] is a 33 y.o. at [redacted]w[redacted]d here in MAU reporting: pelvic pressure and lower back pain that has been constant today. Denies VB or LOF. +FM.   Onset of complaint: all day  Pain score: 9 - pelvic; 10 - back  Vitals:   08/14/24 1955  BP: 103/64  Pulse: 94  Resp: 16  Temp: 98.3 F (36.8 C)  SpO2: 96%     FHT: 152  Lab orders placed from triage: UA

## 2024-08-14 NOTE — MAU Provider Note (Signed)
 History     CSN: [id number]  Arrival date and time: 08/14/24 1942   Event Date/Time   First Provider Initiated Contact with Patient 08/14/24 2051      Chief Complaint  Patient presents with   Back Pain   Pelvic Pain   Nicole [name] , a  33 y.o. G2P1001 at [redacted]w[redacted]d presents to MAU with complaints of lower pelvic and back pain. Patient states that all day today she has been having lower back and pelvic pain when the baby moves. She states that normally she can get the baby to move and she feels better, but today the baby has been in the same spot. Pressure is worsened by walking, standing and sitting. She reports trying to shower with minimal relief. She reports that the pressure is worse in the front. She denies vaginal bleeding, abnormal vaginal discharge, and urinary symptoms. She endorses that the baby is moving, she just wont move from where she is currently.           OB History     Gravida  2   Para  1   Term  1   Preterm  0   AB  0   Living  1      SAB  0   IAB  0   Ectopic  0   Multiple  0   Live Births  1           Past Medical History:  Diagnosis Date   Anxiety    Depression    Diabetes mellitus without complication (HCC)    HSV-2 (herpes simplex virus 2) infection    Hypothyroidism 08/07/2022   Schizophrenia (HCC)    takes abilify    Tobacco use disorder 08/07/2022    Past Surgical History:  Procedure Laterality Date   NO PAST SURGERIES     WISDOM TOOTH EXTRACTION Bilateral 2020    Family History  Problem Relation Age of Onset   Hypertension Father    Diabetes Father     Social History   Tobacco Use   Smoking status: Some Days    Current packs/day: 0.00    Average packs/day: 0.3 packs/day for 1 year (0.3 ttl pk-yrs)    Types: Cigarettes    Start date: 2021    Last attempt to quit: 02/17/2024    Years since quitting: 0.4   Smokeless tobacco: Current  Vaping Use   Vaping status: Former   Quit date: 01/13/2024    Substances: Flavoring  Substance Use Topics   Alcohol use: Not Currently    Comment: weekly   Drug use: Not Currently    Types: Marijuana, Other-see comments    Comment: Xanax    Allergies:  Allergies  Allergen Reactions   Apple Juice Anaphylaxis and Other (See Comments)   Other Anaphylaxis and Other (See Comments)    SALMON   Watermelon Flavoring Agent (Non-Screening) Anaphylaxis   Apple (Diagnostic)     Other Reaction(s): Unknown   Fish Oil     Other Reaction(s): Unknown    Facility-Administered Medications Prior to Admission  Medication Dose Route Frequency Provider Last Rate Last Admin   ARIPiprazole  ER (ABILIFY  MAINTENA) 400 MG prefilled syringe 400 mg  400 mg Intramuscular Q28 days    400 mg at 08/11/24 1345   Medications Prior to Admission  Medication Sig Dispense Refill Last Dose/Taking   ARIPiprazole  ER (ABILIFY  MAINTENA) 400 MG PRSY prefilled syringe Inject 400 mg into the muscle every 28 (twenty-eight) days. 3 each  11 Past Week   aspirin  EC 81 MG tablet Take 1 tablet (81 mg total) by mouth daily. Swallow whole. 90 tablet 5 08/13/2024   famotidine  (PEPCID ) 40 MG tablet Take 1 tablet (40 mg total) by mouth daily. 90 tablet 3 08/13/2024   hydrOXYzine  (ATARAX ) 10 MG tablet Take 1 tablet (10 mg total) by mouth 3 (three) times daily as needed. 90 tablet 3 08/13/2024   lamoTRIgine  (LAMICTAL ) 25 MG tablet Take 2 tablets (50 mg total) by mouth daily. 60 tablet 3 08/13/2024   ondansetron  (ZOFRAN -ODT) 8 MG disintegrating tablet Take 1 tablet (8 mg total) by mouth every 8 (eight) hours as needed for nausea or vomiting. 20 tablet 0 08/13/2024   Prenatal 28-0.8 MG TABS Take 1 tablet by mouth daily. 90 tablet 12 08/13/2024   QUEtiapine  (SEROQUEL ) 50 MG tablet Take 1 tablet (50 mg total) by mouth at bedtime. 60 tablet 1 08/13/2024   sertraline  (ZOLOFT ) 50 MG tablet Take 3 tablets (150 mg total) by mouth at bedtime. 90 tablet 3 08/13/2024   valACYclovir  (VALTREX ) 500 MG tablet Take 1  tablet (500 mg total) by mouth daily. 30 tablet 8 08/13/2024   Accu-Chek Softclix Lancets lancets Use as instructed 100 each 12    acetaminophen -caffeine  (EXCEDRIN TENSION HEADACHE) 500-65 MG TABS per tablet Take 1 tablet by mouth every 6 (six) hours as needed. For headache. 60 tablet 0    Blood Glucose Monitoring Suppl (ACCU-CHEK GUIDE) w/Device KIT 1 Device by Does not apply route 4 (four) times daily. 1 kit 0    Blood Pressure Monitoring (BLOOD PRESSURE KIT) DEVI 1 kit by Does not apply route as needed. 1 each 0    fluconazole  (DIFLUCAN ) 150 MG tablet Take 1 tablet (150 mg total) by mouth daily. (Patient not taking: Reported on 08/14/2024) 1 tablet 1 Not Taking   glucose blood (ACCU-CHEK GUIDE TEST) test strip Use as instructed 100 each 12    lidocaine  (XYLOCAINE ) 2 % jelly Apply 1 Application topically as needed. 30 mL 0    metroNIDAZOLE  (FLAGYL ) 500 MG tablet Take 1 tablet (500 mg total) by mouth 2 (two) times daily. 14 tablet 0    promethazine  (PHENERGAN ) 25 MG tablet Take 1 tablet (25 mg total) by mouth every 6 (six) hours as needed for nausea or vomiting. 30 tablet 1    terconazole  (TERAZOL 7 ) 0.4 % vaginal cream Place 1 applicator vaginally at bedtime. 45 g 0     Review of Systems  Constitutional:  Negative for chills, fatigue and fever.  Eyes:  Negative for pain and visual disturbance.  Respiratory:  Negative for apnea, shortness of breath and wheezing.   Cardiovascular:  Negative for chest pain and palpitations.  Gastrointestinal:  Positive for abdominal pain. Negative for constipation, diarrhea, nausea and vomiting.  Genitourinary:  Positive for pelvic pain. Negative for difficulty urinating, dysuria, vaginal bleeding, vaginal discharge and vaginal pain.  Musculoskeletal:  Positive for back pain.  Neurological:  Negative for seizures, weakness and headaches.  Psychiatric/Behavioral:  Negative for suicidal ideas.    Physical Exam   Blood pressure 103/64, pulse 94, temperature 98.3  F (36.8 C), temperature source Oral, resp. rate 16, height 5' 1 (1.549 m), weight 92.4 kg, last menstrual period 01/13/2024, SpO2 96%.  Physical Exam Vitals and nursing note reviewed.  Constitutional:      General: She is not in acute distress.    Appearance: Normal appearance.  HENT:     Head: Normocephalic.  Pulmonary:     Effort:  Pulmonary effort is normal.  Abdominal:     Palpations: Abdomen is soft.     Tenderness: There is no abdominal tenderness.     Comments: Gravid uterus. Fetal movement present on palpation   Musculoskeletal:        General: No swelling. Normal range of motion.     Cervical back: Normal range of motion.  Skin:    General: Skin is warm and dry.     Capillary Refill: Capillary refill takes 2 to 3 seconds.  Neurological:     Mental Status: She is alert and oriented to person, place, and time.  Psychiatric:        Mood and Affect: Mood normal.    FHT: 155 bpm with moderate variability. Accels present no decels.  Toco: UI without contractions   Patient Vitals for the past 24 hrs:  BP Temp Temp src Pulse Resp SpO2 Height Weight  08/14/24 2221 108/68 -- -- 78 -- -- -- --  08/14/24 1955 103/64 98.3 F (36.8 C) Oral 94 16 96 % 5' 1 (1.549 m) 92.4 kg     MAU Course  Procedures Orders Placed This Encounter  Procedures   Urinalysis, Routine w reflex microscopic -Urine, Clean Catch   Meds ordered this encounter  Medications   acetaminophen  (TYLENOL ) tablet 1,000 mg   cyclobenzaprine  (FLEXERIL ) tablet 10 mg   Results for orders placed or performed during the hospital encounter of 08/14/24 (from the past 72 hours)  Urinalysis, Routine w reflex microscopic -Urine, Clean Catch     Status: Abnormal   Collection Time: 08/14/24  7:59 PM  Result Value Ref Range   Color, Urine YELLOW YELLOW   APPearance HAZY (A) CLEAR   Specific Gravity, Urine 1.015 1.005 - 1.030   pH 7.0 5.0 - 8.0   Glucose, UA NEGATIVE NEGATIVE mg/dL   Hgb urine dipstick NEGATIVE  NEGATIVE   Bilirubin Urine NEGATIVE NEGATIVE   Ketones, ur NEGATIVE NEGATIVE mg/dL   Protein, ur NEGATIVE NEGATIVE mg/dL   Nitrite NEGATIVE NEGATIVE   Leukocytes,Ua NEGATIVE NEGATIVE    Comment: Performed at [facility], [address] [place], [place] [address]     MDM - UA to rule out infection causing lower pelvic discomfort.  - UA hazy but otherwise normal. Low with normal vital signs low suspicion for infection.  - Tylenol  and Flexeril  given for discomfort. Heat also applied. Plan to reassess.  - Upon reassessment pain, relieved with PO tylenol  and Flexeril . Patient does report upper abdominal pressure with fetal movement but states that she overall feels much better.  - plans for discharge.   Assessment and Plan   1. Pelvic pain   2. [redacted] weeks gestation of pregnancy   3. Acute bilateral back pain, unspecified back location    - Reviewed 3rd trimester expectations for pelvic discomfort. Discussed comfort measures like warm bath and epsom salt.  - Reviewed worsening signs and return precautions.  - FHT appropriate for gestational age at time of discharge.  - Patient discharged home in stable condition and may return to MAU as needed.   Claris CHRISTELLA Cedar, MSN CNM  08/14/2024, 8:51 PM

## 2024-08-17 ENCOUNTER — Encounter (HOSPITAL_COMMUNITY): Payer: Self-pay | Admitting: Obstetrics & Gynecology

## 2024-08-17 ENCOUNTER — Ambulatory Visit (HOSPITAL_COMMUNITY)

## 2024-08-17 ENCOUNTER — Other Ambulatory Visit: Payer: Self-pay

## 2024-08-17 ENCOUNTER — Inpatient Hospital Stay (HOSPITAL_COMMUNITY)
Admission: AD | Admit: 2024-08-17 | Discharge: 2024-08-17 | Disposition: A | Discharge status: Home / Self Care | Attending: Obstetrics & Gynecology | Admitting: Obstetrics & Gynecology

## 2024-08-17 DIAGNOSIS — O26893 Other specified pregnancy related conditions, third trimester: Secondary | ICD-10-CM | POA: Diagnosis not present

## 2024-08-17 DIAGNOSIS — Z3A31 31 weeks gestation of pregnancy: Secondary | ICD-10-CM | POA: Diagnosis not present

## 2024-08-17 DIAGNOSIS — Z0371 Encounter for suspected problem with amniotic cavity and membrane ruled out: Secondary | ICD-10-CM

## 2024-08-17 DIAGNOSIS — M549 Dorsalgia, unspecified: Secondary | ICD-10-CM | POA: Diagnosis present

## 2024-08-17 DIAGNOSIS — N898 Other specified noninflammatory disorders of vagina: Secondary | ICD-10-CM | POA: Diagnosis not present

## 2024-08-17 LAB — URINALYSIS, ROUTINE W REFLEX MICROSCOPIC
Bilirubin Urine: NEGATIVE
Glucose, UA: NEGATIVE mg/dL
Hgb urine dipstick: NEGATIVE
Ketones, ur: NEGATIVE mg/dL
Nitrite: NEGATIVE
Protein, ur: NEGATIVE mg/dL
Specific Gravity, Urine: 1.012 (ref 1.005–1.030)
pH: 7 (ref 5.0–8.0)

## 2024-08-17 LAB — WET PREP, GENITAL
Clue Cells Wet Prep HPF POC: NONE SEEN
Sperm: NONE SEEN
Trich, Wet Prep: NONE SEEN
WBC, Wet Prep HPF POC: <10 (ref <10)
Yeast Wet Prep HPF POC: NONE SEEN

## 2024-08-17 LAB — RUPTURE OF MEMBRANE (ROM)PLUS: Rom Plus: NEGATIVE

## 2024-08-17 LAB — POCT FERN TEST: POCT Fern Test: NEGATIVE

## 2024-08-17 NOTE — MAU Note (Signed)
 Nicole [name] is a 33 y.o. at [redacted]w[redacted]d here in MAU reporting: she began leaking clear fluid at approximately 1230 this afternoon.  Reports unsure if peed self or water broke.  Denies VB.  Endorses +FM.  LMP: 01/13/2024 Onset of complaint: today 1230 Pain score: 7 lower back Vitals:   08/17/24 1302  BP: 108/66  Pulse: 92  Resp: 18  Temp: 98.7 F (37.1 C)  SpO2: 99%     FHT: 164 bpm  Lab orders placed from triage: UA

## 2024-08-17 NOTE — MAU Provider Note (Signed)
 History     CSN: [id number]  Arrival date and time: 08/17/24 1228   Event Date/Time   First Provider Initiated Contact with Patient 08/17/24 1343      Chief Complaint  Patient presents with   Rupture of Membranes   Back Pain   HPI Ms. IRIANA [name] is a 33 y.o. year old G52P1001 female at [redacted]w[redacted]d weeks gestation who presents to MAU reporting leaking clear fluid at ~1230 this afternoon. She is unsure if she peed on herself or her water broke. She denies VB. She endorses (+) FM. She receives [facility] with [facility]; next appt is 08/23/2024.    OB History     Gravida  2   Para  1   Term  1   Preterm  0   AB  0   Living  1      SAB  0   IAB  0   Ectopic  0   Multiple  0   Live Births  1           Past Medical History:  Diagnosis Date   Anxiety    Depression    Diabetes mellitus without complication (HCC)    HSV-2 (herpes simplex virus 2) infection    Hypothyroidism 08/07/2022   Schizophrenia (HCC)    takes abilify    Tobacco use disorder 08/07/2022    Past Surgical History:  Procedure Laterality Date   NO PAST SURGERIES     WISDOM TOOTH EXTRACTION Bilateral 2020    Family History  Problem Relation Age of Onset   Hypertension Father    Diabetes Father     Social History   Tobacco Use   Smoking status: Some Days    Current packs/day: 0.00    Average packs/day: 0.3 packs/day for 1 year (0.3 ttl pk-yrs)    Types: Cigarettes    Start date: 2021    Last attempt to quit: 02/17/2024    Years since quitting: 0.4   Smokeless tobacco: Current  Vaping Use   Vaping status: Former   Quit date: 01/13/2024   Substances: Flavoring  Substance Use Topics   Alcohol use: Not Currently    Comment: weekly   Drug use: Not Currently    Types: Marijuana, Other-see comments    Comment: Xanax    Allergies:  Allergies  Allergen Reactions   Apple Juice Anaphylaxis and Other (See Comments)   Other Anaphylaxis and Other (See Comments)    SALMON   Watermelon  Flavoring Agent (Non-Screening) Anaphylaxis   Apple (Diagnostic)     Other Reaction(s): Unknown   Fish Oil     Other Reaction(s): Unknown    Facility-Administered Medications Prior to Admission  Medication Dose Route Frequency Provider Last Rate Last Admin   ARIPiprazole  ER (ABILIFY  MAINTENA) 400 MG prefilled syringe 400 mg  400 mg Intramuscular Q28 days    400 mg at 08/11/24 1345   Medications Prior to Admission  Medication Sig Dispense Refill Last Dose/Taking   acetaminophen -caffeine  (EXCEDRIN TENSION HEADACHE) 500-65 MG TABS per tablet Take 1 tablet by mouth every 6 (six) hours as needed. For headache. 60 tablet 0 Past Month   ARIPiprazole  ER (ABILIFY  MAINTENA) 400 MG PRSY prefilled syringe Inject 400 mg into the muscle every 28 (twenty-eight) days. 3 each 11 Past Week   aspirin  EC 81 MG tablet Take 1 tablet (81 mg total) by mouth daily. Swallow whole. 90 tablet 5 08/16/2024   cyclobenzaprine  (FLEXERIL ) 5 MG tablet Take 1 tablet (5 mg total)  by mouth 3 (three) times daily as needed for muscle spasms. 20 tablet 0 08/16/2024   famotidine  (PEPCID ) 40 MG tablet Take 1 tablet (40 mg total) by mouth daily. 90 tablet 3 08/16/2024   hydrOXYzine  (ATARAX ) 10 MG tablet Take 1 tablet (10 mg total) by mouth 3 (three) times daily as needed. 90 tablet 3 Past Month   lamoTRIgine  (LAMICTAL ) 25 MG tablet Take 2 tablets (50 mg total) by mouth daily. 60 tablet 3 Past Month   metroNIDAZOLE  (FLAGYL ) 500 MG tablet Take 1 tablet (500 mg total) by mouth 2 (two) times daily. 14 tablet 0 Past Month   ondansetron  (ZOFRAN -ODT) 8 MG disintegrating tablet Take 1 tablet (8 mg total) by mouth every 8 (eight) hours as needed for nausea or vomiting. 20 tablet 0 08/16/2024   Prenatal 28-0.8 MG TABS Take 1 tablet by mouth daily. 90 tablet 12 08/16/2024   promethazine  (PHENERGAN ) 25 MG tablet Take 1 tablet (25 mg total) by mouth every 6 (six) hours as needed for nausea or vomiting. 30 tablet 1 Past Month   QUEtiapine  (SEROQUEL ) 50  MG tablet Take 1 tablet (50 mg total) by mouth at bedtime. 60 tablet 1 08/16/2024   sertraline  (ZOLOFT ) 50 MG tablet Take 3 tablets (150 mg total) by mouth at bedtime. 90 tablet 3 08/16/2024   valACYclovir  (VALTREX ) 500 MG tablet Take 1 tablet (500 mg total) by mouth daily. 30 tablet 8 08/16/2024   Accu-Chek Softclix Lancets lancets Use as instructed 100 each 12    Blood Glucose Monitoring Suppl (ACCU-CHEK GUIDE) w/Device KIT 1 Device by Does not apply route 4 (four) times daily. 1 kit 0    Blood Pressure Monitoring (BLOOD PRESSURE KIT) DEVI 1 kit by Does not apply route as needed. 1 each 0    fluconazole  (DIFLUCAN ) 150 MG tablet Take 1 tablet (150 mg total) by mouth daily. (Patient not taking: Reported on 08/14/2024) 1 tablet 1    glucose blood (ACCU-CHEK GUIDE TEST) test strip Use as instructed 100 each 12    lidocaine  (XYLOCAINE ) 2 % jelly Apply 1 Application topically as needed. 30 mL 0    terconazole  (TERAZOL 7 ) 0.4 % vaginal cream Place 1 applicator vaginally at bedtime. (Patient not taking: Reported on 08/17/2024) 45 g 0 Not Taking    Review of Systems  Constitutional: Negative.   HENT: Negative.    Eyes: Negative.   Respiratory: Negative.    Cardiovascular: Negative.   Gastrointestinal: Negative.   Endocrine: Negative.   Genitourinary:  Positive for vaginal discharge.  Musculoskeletal: Negative.   Skin: Negative.   Allergic/Immunologic: Negative.   Neurological: Negative.   Hematological: Negative.   Psychiatric/Behavioral: Negative.     Physical Exam   Blood pressure 108/66, pulse 92, temperature 98.7 F (37.1 C), temperature source Oral, resp. rate 18, height 5' 1 (1.549 m), weight 91.4 kg, last menstrual period 01/13/2024, SpO2 99%.  Physical Exam Vitals and nursing note reviewed.  Constitutional:      Appearance: Normal appearance. She is obese.  Cardiovascular:     Rate and Rhythm: Normal rate.  Pulmonary:     Effort: Pulmonary effort is normal.  Genitourinary:     Comments: Swabs collected by RN using blind swab technique  Skin:    General: Skin is warm and dry.  Neurological:     Mental Status: She is alert and oriented to person, place, and time.  Psychiatric:        Mood and Affect: Mood normal.  Behavior: Behavior normal.        Thought Content: Thought content normal.        Judgment: Judgment normal.    REACTIVE NST - FHR: 155 bpm / moderate variability / accels present / decels absent / TOCO: none MAU Course  Procedures  MDM CCUA Fern Slide ROM (+) Wet Prep  Results for orders placed or performed during the hospital encounter of 08/17/24 (from the past 24 hours)  Urinalysis, Routine w reflex microscopic -Urine, Clean Catch     Status: Abnormal   Collection Time: 08/17/24  1:09 PM  Result Value Ref Range   Color, Urine YELLOW YELLOW   APPearance HAZY (A) CLEAR   Specific Gravity, Urine 1.012 1.005 - 1.030   pH 7.0 5.0 - 8.0   Glucose, UA NEGATIVE NEGATIVE mg/dL   Hgb urine dipstick NEGATIVE NEGATIVE   Bilirubin Urine NEGATIVE NEGATIVE   Ketones, ur NEGATIVE NEGATIVE mg/dL   Protein, ur NEGATIVE NEGATIVE mg/dL   Nitrite NEGATIVE NEGATIVE   Leukocytes,Ua SMALL (A) NEGATIVE   RBC / HPF 0-5 0 - 5 RBC/hpf   WBC, UA 0-5 0 - 5 WBC/hpf   Bacteria, UA FEW (A) NONE SEEN   Squamous Epithelial / HPF 6-10 0 - 5 /HPF   Mucus PRESENT   Fern Test     Status: None   Collection Time: 08/17/24  1:34 PM  Result Value Ref Range   POCT Fern Test Negative = intact amniotic membranes   Rupture of Membrane (ROM) Plus     Status: None   Collection Time: 08/17/24  1:48 PM  Result Value Ref Range   Rom Plus NEGATIVE   Wet prep, genital     Status: None   Collection Time: 08/17/24  1:48 PM   Specimen: Vaginal  Result Value Ref Range   Yeast Wet Prep HPF POC NONE SEEN NONE SEEN   Trich, Wet Prep NONE SEEN NONE SEEN   Clue Cells Wet Prep HPF POC NONE SEEN NONE SEEN   WBC, Wet Prep HPF POC <10 <10   Sperm NONE SEEN      Assessment and  Plan  1. No leakage of amniotic fluid into vagina (Primary) - Reassurance given that membranes are not ruptured  2. Vaginal discharge during pregnancy in third trimester - Advised that the vaginal discharge changes are different in various stages of pregnancy  - Discharge home - Keep scheduled appt with [facility] on 08/23/2024 - Patient verbalized an understanding of the plan of care and agrees.   Ala Cart, CNM 08/17/2024, 1:43 PM

## 2024-08-22 ENCOUNTER — Other Ambulatory Visit: Payer: Self-pay

## 2024-08-22 ENCOUNTER — Inpatient Hospital Stay (HOSPITAL_COMMUNITY)

## 2024-08-22 ENCOUNTER — Inpatient Hospital Stay (HOSPITAL_COMMUNITY)
Admission: AD | Admit: 2024-08-22 | Discharge: 2024-08-22 | Disposition: A | Discharge status: Home / Self Care | Payer: Self-pay | Attending: Obstetrics and Gynecology | Admitting: Obstetrics and Gynecology

## 2024-08-22 DIAGNOSIS — F209 Schizophrenia, unspecified: Secondary | ICD-10-CM | POA: Insufficient documentation

## 2024-08-22 DIAGNOSIS — O368130 Decreased fetal movements, third trimester, not applicable or unspecified: Secondary | ICD-10-CM | POA: Diagnosis not present

## 2024-08-22 DIAGNOSIS — O24113 Pre-existing diabetes mellitus, type 2, in pregnancy, third trimester: Secondary | ICD-10-CM | POA: Diagnosis not present

## 2024-08-22 DIAGNOSIS — O24112 Pre-existing diabetes mellitus, type 2, in pregnancy, second trimester: Secondary | ICD-10-CM | POA: Diagnosis present

## 2024-08-22 DIAGNOSIS — O09293 Supervision of pregnancy with other poor reproductive or obstetric history, third trimester: Secondary | ICD-10-CM | POA: Insufficient documentation

## 2024-08-22 DIAGNOSIS — Z3A31 31 weeks gestation of pregnancy: Secondary | ICD-10-CM | POA: Diagnosis not present

## 2024-08-22 DIAGNOSIS — F319 Bipolar disorder, unspecified: Secondary | ICD-10-CM | POA: Diagnosis not present

## 2024-08-22 DIAGNOSIS — O99283 Endocrine, nutritional and metabolic diseases complicating pregnancy, third trimester: Secondary | ICD-10-CM | POA: Diagnosis not present

## 2024-08-22 DIAGNOSIS — O26893 Other specified pregnancy related conditions, third trimester: Secondary | ICD-10-CM

## 2024-08-22 DIAGNOSIS — O0993 Supervision of high risk pregnancy, unspecified, third trimester: Secondary | ICD-10-CM | POA: Insufficient documentation

## 2024-08-22 DIAGNOSIS — O99213 Obesity complicating pregnancy, third trimester: Secondary | ICD-10-CM | POA: Insufficient documentation

## 2024-08-22 DIAGNOSIS — E039 Hypothyroidism, unspecified: Secondary | ICD-10-CM | POA: Insufficient documentation

## 2024-08-22 DIAGNOSIS — J069 Acute upper respiratory infection, unspecified: Secondary | ICD-10-CM

## 2024-08-22 DIAGNOSIS — R112 Nausea with vomiting, unspecified: Secondary | ICD-10-CM | POA: Diagnosis not present

## 2024-08-22 DIAGNOSIS — E669 Obesity, unspecified: Secondary | ICD-10-CM

## 2024-08-22 DIAGNOSIS — R8271 Bacteriuria: Secondary | ICD-10-CM | POA: Diagnosis not present

## 2024-08-22 DIAGNOSIS — O99343 Other mental disorders complicating pregnancy, third trimester: Secondary | ICD-10-CM | POA: Insufficient documentation

## 2024-08-22 DIAGNOSIS — E119 Type 2 diabetes mellitus without complications: Secondary | ICD-10-CM

## 2024-08-22 LAB — CBC WITH DIFFERENTIAL/PLATELET
Abs Immature Granulocytes: 0.12 K/uL — ABNORMAL HIGH (ref 0.00–0.07)
Basophils Absolute: 0 K/uL (ref 0.0–0.1)
Basophils Relative: 0 %
Eosinophils Absolute: 0.2 K/uL (ref 0.0–0.5)
Eosinophils Relative: 2 %
HCT: 32.7 % — ABNORMAL LOW (ref 36.0–46.0)
Hemoglobin: 11 g/dL — ABNORMAL LOW (ref 12.0–15.0)
Immature Granulocytes: 1 %
Lymphocytes Relative: 16 %
Lymphs Abs: 1.7 K/uL (ref 0.7–4.0)
MCH: 31.3 pg (ref 26.0–34.0)
MCHC: 33.6 g/dL (ref 30.0–36.0)
MCV: 93.2 fL (ref 80.0–100.0)
Monocytes Absolute: 0.6 K/uL (ref 0.1–1.0)
Monocytes Relative: 6 %
Neutro Abs: 7.7 K/uL (ref 1.7–7.7)
Neutrophils Relative %: 75 %
Platelets: 224 K/uL (ref 150–400)
RBC: 3.51 MIL/uL — ABNORMAL LOW (ref 3.87–5.11)
RDW: 12.8 % (ref 11.5–15.5)
WBC: 10.3 K/uL (ref 4.0–10.5)
nRBC: 0 % (ref 0.0–0.2)

## 2024-08-22 LAB — URINALYSIS, ROUTINE W REFLEX MICROSCOPIC
Bilirubin Urine: NEGATIVE
Glucose, UA: NEGATIVE mg/dL
Hgb urine dipstick: NEGATIVE
Ketones, ur: NEGATIVE mg/dL
Leukocytes,Ua: NEGATIVE
Nitrite: NEGATIVE
Protein, ur: 30 mg/dL — AB
Specific Gravity, Urine: 1.02 (ref 1.005–1.030)
pH: 6 (ref 5.0–8.0)

## 2024-08-22 LAB — COMPREHENSIVE METABOLIC PANEL WITH GFR
ALT: 22 U/L (ref 0–44)
AST: 22 U/L (ref 15–41)
Albumin: 2.5 g/dL — ABNORMAL LOW (ref 3.5–5.0)
Alkaline Phosphatase: 105 U/L (ref 38–126)
Anion gap: 9 (ref 5–15)
BUN: <5 mg/dL — ABNORMAL LOW (ref 6–20)
CO2: 22 mmol/L (ref 22–32)
Calcium: 8.7 mg/dL — ABNORMAL LOW (ref 8.9–10.3)
Chloride: 103 mmol/L (ref 98–111)
Creatinine, Ser: 0.59 mg/dL (ref 0.44–1.00)
GFR, Estimated: >60 mL/min (ref >60)
Glucose, Bld: 104 mg/dL — ABNORMAL HIGH (ref 70–99)
Potassium: 3.8 mmol/L (ref 3.5–5.1)
Sodium: 134 mmol/L — ABNORMAL LOW (ref 135–145)
Total Bilirubin: 0.2 mg/dL (ref 0.0–1.2)
Total Protein: 6.5 g/dL (ref 6.5–8.1)

## 2024-08-22 LAB — WET PREP, GENITAL
Clue Cells Wet Prep HPF POC: NONE SEEN
Sperm: NONE SEEN
Trich, Wet Prep: NONE SEEN
WBC, Wet Prep HPF POC: <10 (ref <10)
Yeast Wet Prep HPF POC: NONE SEEN

## 2024-08-22 LAB — RESP PANEL BY RT-PCR (RSV, FLU A&B, COVID)  RVPGX2
Influenza A by PCR: NEGATIVE
Influenza B by PCR: NEGATIVE
Resp Syncytial Virus by PCR: NEGATIVE
SARS Coronavirus 2 by RT PCR: NEGATIVE

## 2024-08-22 MED ORDER — ONDANSETRON 8 MG PO TBDP: 4.0000 mg | ORAL_TABLET | Freq: Four times a day (QID) | ORAL | 0 refills | Status: AC | PRN

## 2024-08-22 MED ORDER — ACETAMINOPHEN 10 MG/ML IV SOLN
1000.0000 mg | Freq: Once | INTRAVENOUS | Status: AC
  Administered 2024-08-22: 1000 mg via INTRAVENOUS
  Filled 2024-08-22: qty 100

## 2024-08-22 MED ORDER — GUAIFENESIN ER 600 MG PO TB12: 600.0000 mg | ORAL_TABLET | Freq: Two times a day (BID) | ORAL | 0 refills | Status: DC | PRN

## 2024-08-22 MED ORDER — ONDANSETRON HCL 4 MG/2ML IJ SOLN
4.0000 mg | Freq: Four times a day (QID) | INTRAMUSCULAR | Status: DC | PRN
  Administered 2024-08-22: 4 mg via INTRAVENOUS
  Filled 2024-08-22: qty 2

## 2024-08-22 MED ORDER — ACETAMINOPHEN 500 MG PO TABS: 1000.0000 mg | ORAL_TABLET | Freq: Four times a day (QID) | ORAL | 0 refills | Status: DC | PRN

## 2024-08-22 MED ORDER — LACTATED RINGERS IV BOLUS
1000.0000 mL | Freq: Once | INTRAVENOUS | Status: AC
  Administered 2024-08-22: 1000 mL via INTRAVENOUS

## 2024-08-22 MED ORDER — METOCLOPRAMIDE HCL 5 MG/ML IJ SOLN
10.0000 mg | Freq: Once | INTRAMUSCULAR | Status: AC
  Administered 2024-08-22: 10 mg via INTRAVENOUS
  Filled 2024-08-22: qty 2

## 2024-08-22 NOTE — MAU Note (Cosign Needed)
 Maternal Assessment Unit Provider Note  Subjective: Nicole [name] is a 33 y.o. G2P1001 pregnant female at [redacted]w[redacted]d who presents to MAU today with complaint of URI symptoms, decreased fetal movement, and vomiting.   Started feeling poorly 3 days prior to presentation on 11/6.  Symptoms include diffuse body aches, sweats, chills, headache, cough, congestion, sore throat, and shortness of breath.  The last 24 hours she has vomited x 3 and has nausea in the MAU.  Last Food down yesterday midday.  Vomited up her dinner.  She reports drinking lots of fluid because she feels thirsty.  No diarrhea, no fever, no dysuria.  No contractions, no vaginal bleeding, no leaking fluid.  She has had fetal movement but decreased from normal.  Still remains decreased in the MAU.  No treatments tried.  No sick contacts.  She reports getting a influenza vaccine but not a COVID-vaccine this season.  She is a current smoker-smokes a couple  cigarettes a day.  Receives care at [facility]. Prenatal records reviewed.  Pertinent items noted in HPI and remainder of comprehensive ROS otherwise negative.   Objective: BP 98/69   Pulse 88   Temp 98.4 F (36.9 C) (Oral)   Resp 18   Ht 5' 1 (1.549 m)   Wt 91.3 kg   LMP 01/13/2024 (Exact Date)   SpO2 96%   BMI 38.02 kg/m  Physical Exam Vitals reviewed.  Constitutional:      General: She is not in acute distress.    Appearance: She is well-developed. She is ill-appearing. She is not diaphoretic.     Comments: Appears fatigued  HENT:     Head: Normocephalic and atraumatic.     Mouth/Throat:     Mouth: Mucous membranes are moist.  Eyes:     General: No scleral icterus.    Pupils: Pupils are equal, round, and reactive to light.  Cardiovascular:     Rate and Rhythm: Regular rhythm. Tachycardia present.     Heart sounds: No murmur heard. Pulmonary:     Effort: Pulmonary effort is normal. No respiratory distress.  Abdominal:     General: There is no  distension.     Palpations: Abdomen is soft.     Tenderness: There is no abdominal tenderness. There is no guarding or rebound.  Skin:    General: Skin is warm and dry.  Neurological:     Mental Status: She is alert.  Psychiatric:        Mood and Affect: Mood normal.        Speech: Speech normal.        Behavior: Behavior normal.      MDM: High risk  MAU Course:  Time: 1100  Patient assessed.  Holding emesis bag in the room.  Orders for UA, respiratory virus panel, 1 L LR, labs, IV acetaminophen  and 4 mg IV Zofran  placed. BPP ordered due to decreased fetal movement.  FHT: baseline 150 bpm, moderate variability, plus accelerations, rare variable decelerations,   Contractions: None  Reassessment:  Time 1300 Patient sleeping, feeling better. Reviewed results which are unremarkable.  BPP pending She is requesting STI testing as she has a concern for exposure to trichomonas.  Reassessment: Time 1345 BPP complete 8/8 Patient feeling increased fetal movement. Wet prep test negative.  Patient feeling better  FHT: baseline 140 bpm, moderate variability, + accelerations, occasional variable decelerations--appropriate for gestational age, REACTIVE/reassuring   Contractions: none  Discharge home with supportive treatment for viral illness--zofran , guaifenesin, acetaminophen  prescribed.  Questions were answered to the satisfaction of the patient and/or family prior to discharge.   Assessment Medical screening exam complete    ICD-10-CM   1. Nausea and vomiting, unspecified vomiting type  R11.2     2. Viral upper respiratory illness  J06.9     3. Decreased fetal movements in third trimester, single or unspecified fetus  O36.8130        Plan: Supportive care--acetaminophen , guaifenesin, zofran , adequate hydration and rest for acute viral illness. Discharge from MAU in stable condition with strict return/pre-term labor precautions  Follow up at [facility] as scheduled for  ongoing prenatal care  Allergies as of 08/22/2024       Reactions   Apple Juice Anaphylaxis, Other (See Comments)   Other Anaphylaxis, Other (See Comments)   SALMON   Watermelon Flavoring Agent (non-screening) Anaphylaxis   Apple (diagnostic)    Other Reaction(s): Unknown   Fish Oil    Other Reaction(s): Unknown        Medication List     TAKE these medications    Abilify  Maintena 400 MG Prsy prefilled syringe Generic drug: ARIPiprazole  ER Inject 400 mg into the muscle every 28 (twenty-eight) days.   Accu-Chek Guide Test test strip Generic drug: glucose blood Use as instructed   Accu-Chek Guide w/Device Kit 1 Device by Does not apply route 4 (four) times daily.   Accu-Chek Softclix Lancets lancets Use as instructed   acetaminophen  500 MG tablet Commonly known as: TYLENOL  Take 2 tablets (1,000 mg total) by mouth every 6 (six) hours as needed for moderate pain (pain score 4-6) or mild pain (pain score 1-3).   acetaminophen -caffeine  500-65 MG Tabs per tablet Commonly known as: EXCEDRIN TENSION HEADACHE Take 1 tablet by mouth every 6 (six) hours as needed. For headache.   aspirin  EC 81 MG tablet Take 1 tablet (81 mg total) by mouth daily. Swallow whole.   Blood Pressure Kit Devi 1 kit by Does not apply route as needed.   cyclobenzaprine  5 MG tablet Commonly known as: FLEXERIL  Take 1 tablet (5 mg total) by mouth 3 (three) times daily as needed for muscle spasms.   famotidine  40 MG tablet Commonly known as: PEPCID  Take 1 tablet (40 mg total) by mouth daily.   fluconazole  150 MG tablet Commonly known as: Diflucan  Take 1 tablet (150 mg total) by mouth daily.   guaiFENesin 600 MG 12 hr tablet Commonly known as: MUCINEX Take 1 tablet (600 mg total) by mouth 2 (two) times daily as needed for cough or to loosen phlegm.   hydrOXYzine  10 MG tablet Commonly known as: ATARAX  Take 1 tablet (10 mg total) by mouth 3 (three) times daily as needed.   lamoTRIgine  25 MG  tablet Commonly known as: LAMICTAL  Take 2 tablets (50 mg total) by mouth daily.   lidocaine  2 % jelly Commonly known as: XYLOCAINE  Apply 1 Application topically as needed.   metroNIDAZOLE  500 MG tablet Commonly known as: FLAGYL  Take 1 tablet (500 mg total) by mouth 2 (two) times daily.   ondansetron  8 MG disintegrating tablet Commonly known as: ZOFRAN -ODT Take 0.5 tablets (4 mg total) by mouth every 6 (six) hours as needed for nausea or vomiting. What changed:  how much to take when to take this   Prenatal 28-0.8 MG Tabs Take 1 tablet by mouth daily.   promethazine  25 MG tablet Commonly known as: PHENERGAN  Take 1 tablet (25 mg total) by mouth every 6 (six) hours as needed for nausea or vomiting.  QUEtiapine  50 MG tablet Commonly known as: SEROquel  Take 1 tablet (50 mg total) by mouth at bedtime.   sertraline  50 MG tablet Commonly known as: ZOLOFT  Take 3 tablets (150 mg total) by mouth at bedtime.   terconazole  0.4 % vaginal cream Commonly known as: TERAZOL 7  Place 1 applicator vaginally at bedtime.   valACYclovir  500 MG tablet Commonly known as: VALTREX  Take 1 tablet (500 mg total) by mouth daily.        Trudy Leeroy NOVAK, MD 08/22/2024 1:52 PM

## 2024-08-22 NOTE — MAU Note (Signed)
 Nicole [name] is a 33 y.o. at [redacted]w[redacted]d here in MAU reporting: symptoms started thurs-fri  Reporting headache, body aches, chills, congestion, runny nose, coughing, SOB, and chest pain. Reports DFM since yesterday, feeling some movement just not as much. Reports N/V x3 vomiting episode in 24 hours. Denies taking any medication today.   Denies any CTXs, LOF or VB.   Pain: 10 everywhere  Vitals:   08/22/24 0943  BP: 99/61  Pulse: (!) 108  Resp: (!) 24  Temp: 98.2 F (36.8 C)  SpO2: 95%     FHT:160 Lab orders placed from triage:  Resp swab, UA

## 2024-08-22 NOTE — Discharge Instructions (Signed)
Call your OB Clinic or go to [facility] if: °· You begin to have strong, frequent contractions °· Your water breaks.  Sometimes it is a big gush of fluid, sometimes it is just a trickle that keeps getting your panties wet or running down your legs °· You have vaginal bleeding.  It is normal to have a small amount of spotting if your cervix was checked.  °· You don't feel your baby moving like normal.  If you don't, get you something to eat and drink and lay down and focus on feeling your baby move.  You should feel at least 10 movements in 2 hours.  If you don't, you should call the office or go to [facility].  ° °

## 2024-08-23 ENCOUNTER — Ambulatory Visit: Admitting: Obstetrics and Gynecology

## 2024-08-23 VITALS — BP 90/62 | HR 93 | Wt 204.0 lb

## 2024-08-23 DIAGNOSIS — F209 Schizophrenia, unspecified: Secondary | ICD-10-CM

## 2024-08-23 DIAGNOSIS — O0990 Supervision of high risk pregnancy, unspecified, unspecified trimester: Secondary | ICD-10-CM

## 2024-08-23 DIAGNOSIS — E039 Hypothyroidism, unspecified: Secondary | ICD-10-CM | POA: Diagnosis not present

## 2024-08-23 DIAGNOSIS — Z3A31 31 weeks gestation of pregnancy: Secondary | ICD-10-CM

## 2024-08-23 DIAGNOSIS — R8271 Bacteriuria: Secondary | ICD-10-CM

## 2024-08-23 DIAGNOSIS — O24112 Pre-existing diabetes mellitus, type 2, in pregnancy, second trimester: Secondary | ICD-10-CM | POA: Diagnosis not present

## 2024-08-23 LAB — CULTURE, OB URINE: Culture: <10000 — AB

## 2024-08-23 LAB — GC/CHLAMYDIA PROBE AMP (~~LOC~~) NOT AT ARMC
Chlamydia: NEGATIVE
Comment: NEGATIVE
Comment: NORMAL
Neisseria Gonorrhea: NEGATIVE

## 2024-08-23 NOTE — Patient Instructions (Signed)
   Considering Waterbirth? Guide for patients at [facility]) Why consider waterbirth? Gentle birth for babies  Less pain medicine used in labor  May allow for passive descent/less pushing  May reduce perineal tears  More mobility and instinctive maternal position changes  Increased maternal relaxation   Is waterbirth safe? What are the risks of infection, drowning or other complications? Infection:  Very low risk (3.7 % for tub vs 4.8% for bed)  7 in 8000 waterbirths with documented infection  Poorly cleaned equipment most common cause  Slightly lower group B strep transmission rate  Drowning  Maternal:  Very low risk  Related to seizures or fainting  Newborn:  Very low risk. No evidence of increased risk of respiratory problems in multiple large studies  Physiological protection from breathing under water  Avoid underwater birth if there are any fetal complications  Once baby's head is out of the water, keep it out.  Birth complication  Some reports of cord trauma, but risk decreased by bringing baby to surface gradually  No evidence of increased risk of shoulder dystocia. Mothers can usually change positions faster in water than in a bed, possibly aiding the maneuvers to free the shoulder.   There are 2 things you MUST do to have a waterbirth with [facility]: Attend a waterbirth class at [facility] at [facility]   3rd Wednesday of every month from 7-9 pm (virtual during COVID) [employer] at [email or web address] or [email or web address] or by calling [phone] Bring us  the certificate from the class to your prenatal appointment or send via MyChart Meet with a midwife at 36 weeks* to see if you can still plan a waterbirth and to sign the consent.   *We also recommend that you schedule as many of your prenatal visits with a midwife as possible.    Helpful information: You may want to bring a bathing suit top to the hospital  to wear during labor but this is optional.  All other supplies are provided by the hospital. Please arrive at the hospital with signs of active labor, and do not wait at home until late in labor. It takes 45 min- 1 hour for fetal monitoring, and check in to your room to take place, plus transport and filling of the waterbirth tub.    Things that would prevent you from having a waterbirth: Premature, <37wks  Previous cesarean birth  Presence of thick meconium-stained fluid  Multiple gestation (Twins, triplets, etc.)  Uncontrolled diabetes or gestational diabetes requiring medication  Hypertension diagnosed in pregnancy or preexisting hypertension (gestational hypertension, preeclampsia, or chronic hypertension) Fetal growth restriction (your baby measures less than 10th percentile on ultrasound) Heavy vaginal bleeding  Non-reassuring fetal heart rate  Active infection (MRSA, etc.). Group B Strep is NOT a contraindication for waterbirth.  If your labor has to be induced and induction method requires continuous monitoring of the baby's heart rate  Other risks/issues identified by your obstetrical provider   Please remember that birth is unpredictable. Under certain unforeseeable circumstances your provider may advise against giving birth in the tub. These decisions will be made on a case-by-case basis and with the safety of you and your baby as our highest priority.    Updated 01/16/22

## 2024-08-23 NOTE — Progress Notes (Signed)
   PRENATAL VISIT NOTE  Subjective:  Nicole [name] is a 33 y.o. G2P1001 at [redacted]w[redacted]d being seen today for ongoing prenatal care.  She is currently monitored for the following issues for this high-risk pregnancy and has Schizophrenia, unspecified (HCC); MDD (major depressive disorder), recurrent episode, moderate (HCC); Hypothyroidism; Tobacco use disorder; Borderline intellectual functioning; FAS (fetal alcohol syndrome); Anxiety state; Insomnia due to other mental disorder; Schizoaffective disorder, bipolar type (HCC); Essential hypertension; Type 2 diabetes mellitus affecting pregnancy in second trimester, antepartum; Supervision of high risk pregnancy, antepartum; Class 1 obesity due to excess calories with serious comorbidity and body mass index (BMI) of 33.0 to 33.9 in adult; History of gestational diabetes mellitus (GDM) in prior pregnancy, currently pregnant; Abnormal thyroid  function test; Tobacco smoking affecting pregnancy; Gonorrhea in pregnancy; and GBS bacteriuria on their problem list.  Patient doing well with no acute concerns today. She reports recent viral illness.  Contractions: Not present. Vag. Bleeding: None.  Movement: Present. Denies leaking of fluid.   The following portions of the patient's history were reviewed and updated as appropriate: allergies, current medications, past family history, past medical history, past social history, past surgical history and problem list. Problem list updated.  Objective:   Vitals:   08/23/24 1019  BP: 90/62  Pulse: 93  Weight: 204 lb (92.5 kg)    Fetal Status: Fetal Heart Rate (bpm): 156 Fundal Height: 31 cm Movement: Present     General:  Alert, oriented and cooperative. Patient is in no acute distress.  Skin: Skin is warm and dry. No rash noted.   Cardiovascular: Normal heart rate noted  Respiratory: Normal respiratory effort, no problems with respiration noted  Abdomen: Soft, gravid, appropriate for gestational age.   Pain/Pressure: Absent     Pelvic: Cervical exam deferred        Extremities: Normal range of motion.  Edema: None  Mental Status:  Normal mood and affect. Normal behavior. Normal judgment and thought content.   Assessment and Plan:  Pregnancy: G2P1001 at [redacted]w[redacted]d  1. Type 2 diabetes mellitus affecting pregnancy in second trimester, antepartum (Primary) Pt did not really bring in any blood sugars.  Advised she needs to bring in blood sugars every visit. Per pt recollection average  fasting blood sugar was 81 and average postprandial was 125  2. Hypothyroidism, unspecified type Recent TSH was normal  3. Supervision of high risk pregnancy, antepartum Continue routine prenatal care  4. Schizophrenia, unspecified type (HCC)   5. GBS bacteriuria Treat in labor  Preterm labor symptoms and general obstetric precautions including but not limited to vaginal bleeding, contractions, leaking of fluid and fetal movement were reviewed in detail with the patient.  Please refer to After Visit Summary for other counseling recommendations.   Return in about 2 weeks (around 09/06/2024) for ROB, in person.   Jerilynn Buddle, MD Faculty Attending [facility] for [facility]

## 2024-08-29 ENCOUNTER — Ambulatory Visit: Payer: Self-pay

## 2024-08-30 ENCOUNTER — Other Ambulatory Visit: Payer: Self-pay | Admitting: Maternal & Fetal Medicine

## 2024-08-30 ENCOUNTER — Ambulatory Visit: Attending: Maternal & Fetal Medicine | Admitting: Maternal & Fetal Medicine

## 2024-08-30 ENCOUNTER — Ambulatory Visit (HOSPITAL_BASED_OUTPATIENT_CLINIC_OR_DEPARTMENT_OTHER)

## 2024-08-30 VITALS — BP 103/61 | HR 99

## 2024-08-30 DIAGNOSIS — O99213 Obesity complicating pregnancy, third trimester: Secondary | ICD-10-CM | POA: Diagnosis present

## 2024-08-30 DIAGNOSIS — O24112 Pre-existing diabetes mellitus, type 2, in pregnancy, second trimester: Secondary | ICD-10-CM

## 2024-08-30 DIAGNOSIS — O365931 Maternal care for other known or suspected poor fetal growth, third trimester, fetus 1: Secondary | ICD-10-CM

## 2024-08-30 DIAGNOSIS — O99333 Smoking (tobacco) complicating pregnancy, third trimester: Secondary | ICD-10-CM | POA: Diagnosis not present

## 2024-08-30 DIAGNOSIS — O99323 Drug use complicating pregnancy, third trimester: Secondary | ICD-10-CM | POA: Diagnosis not present

## 2024-08-30 DIAGNOSIS — O0990 Supervision of high risk pregnancy, unspecified, unspecified trimester: Secondary | ICD-10-CM | POA: Diagnosis not present

## 2024-08-30 DIAGNOSIS — E039 Hypothyroidism, unspecified: Secondary | ICD-10-CM | POA: Diagnosis not present

## 2024-08-30 DIAGNOSIS — F1990 Other psychoactive substance use, unspecified, uncomplicated: Secondary | ICD-10-CM | POA: Diagnosis not present

## 2024-08-30 DIAGNOSIS — O09293 Supervision of pregnancy with other poor reproductive or obstetric history, third trimester: Secondary | ICD-10-CM | POA: Insufficient documentation

## 2024-08-30 DIAGNOSIS — O10912 Unspecified pre-existing hypertension complicating pregnancy, second trimester: Secondary | ICD-10-CM

## 2024-08-30 DIAGNOSIS — O24113 Pre-existing diabetes mellitus, type 2, in pregnancy, third trimester: Secondary | ICD-10-CM | POA: Insufficient documentation

## 2024-08-30 DIAGNOSIS — O99343 Other mental disorders complicating pregnancy, third trimester: Secondary | ICD-10-CM | POA: Insufficient documentation

## 2024-08-30 DIAGNOSIS — E119 Type 2 diabetes mellitus without complications: Secondary | ICD-10-CM

## 2024-08-30 DIAGNOSIS — O10913 Unspecified pre-existing hypertension complicating pregnancy, third trimester: Secondary | ICD-10-CM | POA: Insufficient documentation

## 2024-08-30 DIAGNOSIS — O99283 Endocrine, nutritional and metabolic diseases complicating pregnancy, third trimester: Secondary | ICD-10-CM | POA: Diagnosis not present

## 2024-08-30 DIAGNOSIS — F17200 Nicotine dependence, unspecified, uncomplicated: Secondary | ICD-10-CM | POA: Insufficient documentation

## 2024-08-30 DIAGNOSIS — Z3A32 32 weeks gestation of pregnancy: Secondary | ICD-10-CM

## 2024-08-30 DIAGNOSIS — O99210 Obesity complicating pregnancy, unspecified trimester: Secondary | ICD-10-CM

## 2024-08-30 DIAGNOSIS — I10 Essential (primary) hypertension: Secondary | ICD-10-CM | POA: Diagnosis not present

## 2024-08-30 DIAGNOSIS — F319 Bipolar disorder, unspecified: Secondary | ICD-10-CM | POA: Diagnosis not present

## 2024-08-30 DIAGNOSIS — O365930 Maternal care for other known or suspected poor fetal growth, third trimester, not applicable or unspecified: Secondary | ICD-10-CM

## 2024-08-30 NOTE — Progress Notes (Signed)
 Patient information  Patient Name: Nicole [name]  Patient MRN:   [id number]  Referring practice: MFM Referring Provider: [facility]  Problem List   Patient Active Problem List   Diagnosis Date Noted   GBS bacteriuria 08/02/2024   Gonorrhea in pregnancy 06/20/2024   History of gestational diabetes mellitus (GDM) in prior pregnancy, currently pregnant 05/27/2024   Abnormal thyroid  function test 05/27/2024   Tobacco smoking affecting pregnancy 05/27/2024   Essential hypertension 02/18/2024   Type 2 diabetes mellitus affecting pregnancy in second trimester, antepartum 02/18/2024   Supervision of high risk pregnancy, antepartum 02/18/2024   Schizoaffective disorder, bipolar type (HCC) 11/07/2023   Anxiety state 12/04/2022   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   Hypothyroidism 08/07/2022   Tobacco use disorder 08/07/2022   MDD (major depressive disorder), recurrent episode, moderate (HCC)    Schizophrenia, unspecified (HCC) 06/10/2022   Class 1 obesity due to excess calories with serious comorbidity and body mass index (BMI) of 33.0 to 33.9 in adult 05/28/2018    Maternal Fetal medicine Consult  Nicole [name] is a 33 y.o. G2P1001 at [redacted]w[redacted]d here for ultrasound and consultation. Nicole [name] is doing well today with no acute concerns. Today we focused on the following:   DM2: The patient reports that her fasting blood sugars are mostly well-controlled but she occasionally has postprandial hyperglycemia due to dietary indiscretion.  We discussed the importance of a proper diabetic diet during pregnancy and to increase her exercise before and/or after meals in the form of walking.  I said we should try this for 1 week and she should bring her glucose log to her doctor's office and if greater than 25% of her values are not at goal then a medication  should be started.  We discussed the clinical significance and importance of properly controlled blood sugars during pregnancy to limit the risk of adverse outcomes.  Tobacco use: The patient reports the patient is smoking 7 cigarettes/day encouraged her to cut back is much as possible and to have the goal of quitting.  The patient is going to try to cut back.  The patient had time to ask questions that were answered to her satisfaction.  She verbalized understanding and agrees to proceed with the plan below.  Recommendations -Continue weekly antenatal testing and serial growth ultrasounds. -Patient will try to cut back on cigarette use during pregnancy -Encouraged diabetic diet and frequent walking  Review of Systems: A review of systems was performed and was negative except per HPI   Vitals and Physical Exam    08/30/2024   10:19 AM 08/23/2024   10:19 AM 08/22/2024   12:46 PM  Vitals with BMI  Weight  204 lbs   BMI  38.57   Systolic 103 90 96  Diastolic 61 62 52  Pulse 99 93 79    Sitting comfortably on the sonogram table Nonlabored breathing Normal rate and rhythm Abdomen is nontender  Past pregnancies OB History  Gravida Para Term Preterm AB Living  2 1 1  0 0 1  SAB IAB Ectopic Multiple Live Births  0 0 0 0 1    # Outcome Date GA Lbr Len/2nd Weight Sex Type Anes PTL Lv  2 Current           1 Term 03/25/18    F Vag-Spont   LIV  I spent 30 minutes reviewing the patients chart, including labs and images as well as counseling the patient about her medical conditions. Greater than 50% of the time was spent in direct face-to-face patient counseling.  Nicole [name]  MFM, [facility]   08/30/2024  11:41 AM

## 2024-09-01 ENCOUNTER — Ambulatory Visit: Admitting: Orthopedic Surgery

## 2024-09-02 ENCOUNTER — Encounter (HOSPITAL_COMMUNITY): Payer: Self-pay

## 2024-09-02 ENCOUNTER — Telehealth (HOSPITAL_COMMUNITY): Admitting: Psychiatry

## 2024-09-02 ENCOUNTER — Encounter (HOSPITAL_COMMUNITY): Payer: Self-pay | Admitting: Psychiatry

## 2024-09-02 ENCOUNTER — Telehealth (INDEPENDENT_AMBULATORY_CARE_PROVIDER_SITE_OTHER): Admitting: Psychiatry

## 2024-09-02 DIAGNOSIS — F411 Generalized anxiety disorder: Secondary | ICD-10-CM

## 2024-09-02 DIAGNOSIS — F250 Schizoaffective disorder, bipolar type: Secondary | ICD-10-CM | POA: Diagnosis not present

## 2024-09-02 MED ORDER — SERTRALINE HCL 50 MG PO TABS: 150.0000 mg | ORAL_TABLET | Freq: Every day | ORAL | 3 refills | Status: DC

## 2024-09-02 MED ORDER — QUETIAPINE FUMARATE 50 MG PO TABS: 50.0000 mg | ORAL_TABLET | Freq: Every day | ORAL | 3 refills | Status: DC

## 2024-09-02 MED ORDER — ABILIFY MAINTENA 400 MG IM PRSY: 400.0000 mg | PREFILLED_SYRINGE | INTRAMUSCULAR | 11 refills | Status: DC

## 2024-09-02 MED ORDER — LAMOTRIGINE 25 MG PO TABS: 50.0000 mg | ORAL_TABLET | Freq: Every day | ORAL | 3 refills | Status: DC

## 2024-09-02 MED ORDER — HYDROXYZINE HCL 10 MG PO TABS: 10.0000 mg | ORAL_TABLET | Freq: Three times a day (TID) | ORAL | 3 refills | Status: DC | PRN

## 2024-09-02 NOTE — Progress Notes (Signed)
 BH MD/PA/NP OP Progress Note Virtual Visit via Video Note  I connected with Nicole [name] on 09/02/24 at  3:30 PM EST by a video enabled telemedicine application and verified that I am speaking with the correct person using two identifiers.  Location: Patient: Home Provider: Clinic   I discussed the limitations of evaluation and management by telemedicine and the availability of in person appointments. The patient expressed understanding and agreed to proceed.  I provided 30 minutes of non-face-to-face time during this encounter.     09/02/2024 10:11 AM Nicole [name]  MRN:  [id number]  Chief Complaint: I feel better  HPI: 33 year old female seen today for follow-up psychiatric evaluation.  She has a psychiatric history of schizophrenia, depression, anxiety, insomnia, tobacco use, and borderline intellectual functioning. She is currently managed on Abilify  maintainer 400 mg, every 28 days, trazodone  100 mg nightly as needed, Seroquel  50 mg nightly, Zoloft  150 mg daily, hydroxyzine  10 mg three times daily as needed, and Lamictal  50 mg daily.  She inform her that her medications are effective in managing her psychiatric condition.  Today she is well-groomed, pleasant, cooperative, and engaged in conversation.  Informed that she is feeling better.  She notes that she is less irritable denies symptoms of mania.  Patient notes that her mood is more stable and her anxiety and depression are well-managed.  Today provider conducted GAD-7 and patient 3, at her last visit she scored a 20.  Provider also conducted PHQ-9 and patient scored a 0, at her last visit she had a 23.  She endorsed adequate sleep and appetite.  Today she denies SI/HI/AVH, mania, or paranoia.    Patient informed writer that she has 5 weeks left of pregnancy.  She is excited about wearing her baby.  No medication changes made today. Pa medications as prescribed.  No other concerns noted at this time.  Visit Diagnosis:     ICD-10-CM   1. Anxiety state  F41.1 hydrOXYzine  (ATARAX ) 10 MG tablet    sertraline  (ZOLOFT ) 50 MG tablet    2. Schizoaffective disorder, bipolar type (HCC)  F25.0 ARIPiprazole  ER (ABILIFY  MAINTENA) 400 MG PRSY prefilled syringe    lamoTRIgine  (LAMICTAL ) 25 MG tablet    QUEtiapine  (SEROQUEL ) 50 MG tablet            Past Psychiatric History: Schizophrenia (unspecified type), Major depressive disorder, Patient reports that she has been hospitalized due to mental health multiple times.  Patient states that she has done stints at [facility] and [place]  Past Medical History:  Past Medical History:  Diagnosis Date   Anxiety    Depression    Diabetes mellitus without complication (HCC)    HSV-2 (herpes simplex virus 2) infection    Hypothyroidism 08/07/2022   Schizophrenia (HCC)    takes abilify    Tobacco use disorder 08/07/2022    Past Surgical History:  Procedure Laterality Date   NO PAST SURGERIES     WISDOM TOOTH EXTRACTION Bilateral 2020    Family Psychiatric History: mother attempted suicide, Patient reports that her brother used to abuse marijuana and alcohol. Patient states that her mother used to abuse marijuana and alcohol.   Family History:  Family History  Problem Relation Age of Onset   Hypertension Father    Diabetes Father     Social History:  Social History   Socioeconomic History   Marital status: Single    Spouse name: Not on file   Number of children: Not on file  Years of education: Not on file   Highest education level: Not on file  Occupational History   Not on file  Tobacco Use   Smoking status: Some Days    Current packs/day: 0.00    Average packs/day: 0.3 packs/day for 1 year (0.3 ttl pk-yrs)    Types: Cigarettes    Start date: 2021    Last attempt to quit: 02/17/2024    Years since quitting: 0.5   Smokeless tobacco: Current  Vaping Use   Vaping status: Former   Quit date: 01/13/2024   Substances: Flavoring  Substance and Sexual  Activity   Alcohol use: Not Currently    Comment: weekly   Drug use: Not Currently    Types: Marijuana, Other-see comments    Comment: Xanax   Sexual activity: Not Currently    Partners: Female  Other Topics Concern   Not on file  Social History Narrative   Not on file   Social Drivers of Health   Financial Resource Strain: Low Risk  (12/13/2022)   Overall Financial Resource Strain (CARDIA)    Difficulty of Paying Living Expenses: Not hard at all  Food Insecurity: No Food Insecurity (08/22/2024)   Hunger Vital Sign    Worried About Running Out of Food in the Last Year: Never true    Ran Out of Food in the Last Year: Never true  Transportation Needs: Unmet Transportation Needs (08/22/2024)   PRAPARE - Transportation    Lack of Transportation (Medical): Yes    Lack of Transportation (Non-Medical): Yes  Physical Activity: Inactive (12/13/2022)   Exercise Vital Sign    Days of Exercise per Week: 0 days    Minutes of Exercise per Session: 0 min  Stress: Stress Concern Present (12/13/2022)   [employer] of Occupational Health - Occupational Stress Questionnaire    Feeling of Stress : Very much  Social Connections: Socially Integrated (12/13/2022)   Social Connection and Isolation Panel    Frequency of Communication with Friends and Family: More than three times a week    Frequency of Social Gatherings with Friends and Family: Three times a week    Attends Religious Services: More than 4 times per year    Active Member of Clubs or Organizations: Yes    Attends Banker Meetings: More than 4 times per year    Marital Status: Living with partner    Allergies:  Allergies  Allergen Reactions   Apple Juice Anaphylaxis and Other (See Comments)   Other Anaphylaxis and Other (See Comments)    SALMON   Watermelon Flavoring Agent (Non-Screening) Anaphylaxis   Apple (Diagnostic)     Other Reaction(s): Unknown   Fish Oil     Other Reaction(s): Unknown    Metabolic  Disorder Labs: Lab Results  Component Value Date   HGBA1C 5.4 04/06/2024   MPG 99.67 01/24/2024   MPG 117 10/15/2022   Lab Results  Component Value Date   PROLACTIN 2.1 (L) 01/24/2024   PROLACTIN 0.6 (L) 01/14/2024   Lab Results  Component Value Date   CHOL 165 01/24/2024   TRIG 49 01/24/2024   HDL 53 01/24/2024   CHOLHDL 3.1 01/24/2024   VLDL 10 01/24/2024   LDLCALC 102 (H) 01/24/2024   LDLCALC 112 (H) 07/25/2022   Lab Results  Component Value Date   TSH 0.891 08/09/2024   TSH 2.550 01/24/2024    Therapeutic Level Labs: No results found for: LITHIUM No results found for: VALPROATE No results found for: CBMZ  Current Medications: Current Outpatient Medications  Medication Sig Dispense Refill   Accu-Chek Softclix Lancets lancets Use as instructed 100 each 12   acetaminophen  (TYLENOL ) 500 MG tablet Take 2 tablets (1,000 mg total) by mouth every 6 (six) hours as needed for moderate pain (pain score 4-6) or mild pain (pain score 1-3). 60 tablet 0   acetaminophen -caffeine  (EXCEDRIN TENSION HEADACHE) 500-65 MG TABS per tablet Take 1 tablet by mouth every 6 (six) hours as needed. For headache. 60 tablet 0   ARIPiprazole  ER (ABILIFY  MAINTENA) 400 MG PRSY prefilled syringe Inject 400 mg into the muscle every 28 (twenty-eight) days. 3 each 11   aspirin  EC 81 MG tablet Take 1 tablet (81 mg total) by mouth daily. Swallow whole. 90 tablet 5   Blood Glucose Monitoring Suppl (ACCU-CHEK GUIDE) w/Device KIT 1 Device by Does not apply route 4 (four) times daily. 1 kit 0   Blood Pressure Monitoring (BLOOD PRESSURE KIT) DEVI 1 kit by Does not apply route as needed. 1 each 0   cyclobenzaprine  (FLEXERIL ) 5 MG tablet Take 1 tablet (5 mg total) by mouth 3 (three) times daily as needed for muscle spasms. 20 tablet 0   famotidine  (PEPCID ) 40 MG tablet Take 1 tablet (40 mg total) by mouth daily. 90 tablet 3   glucose blood (ACCU-CHEK GUIDE TEST) test strip Use as instructed 100 each 12    guaiFENesin (MUCINEX) 600 MG 12 hr tablet Take 1 tablet (600 mg total) by mouth 2 (two) times daily as needed for cough or to loosen phlegm. 30 tablet 0   hydrOXYzine  (ATARAX ) 10 MG tablet Take 1 tablet (10 mg total) by mouth 3 (three) times daily as needed. 90 tablet 3   lamoTRIgine  (LAMICTAL ) 25 MG tablet Take 2 tablets (50 mg total) by mouth daily. 60 tablet 3   lidocaine  (XYLOCAINE ) 2 % jelly Apply 1 Application topically as needed. 30 mL 0   metroNIDAZOLE  (FLAGYL ) 500 MG tablet Take 1 tablet (500 mg total) by mouth 2 (two) times daily. 14 tablet 0   ondansetron  (ZOFRAN -ODT) 8 MG disintegrating tablet Take 0.5 tablets (4 mg total) by mouth every 6 (six) hours as needed for nausea or vomiting. 20 tablet 0   Prenatal 28-0.8 MG TABS Take 1 tablet by mouth daily. 90 tablet 12   promethazine  (PHENERGAN ) 25 MG tablet Take 1 tablet (25 mg total) by mouth every 6 (six) hours as needed for nausea or vomiting. 30 tablet 1   QUEtiapine  (SEROQUEL ) 50 MG tablet Take 1 tablet (50 mg total) by mouth at bedtime. 30 tablet 3   sertraline  (ZOLOFT ) 50 MG tablet Take 3 tablets (150 mg total) by mouth at bedtime. 90 tablet 3   valACYclovir  (VALTREX ) 500 MG tablet Take 1 tablet (500 mg total) by mouth daily. 30 tablet 8   Current Facility-Administered Medications  Medication Dose Route Frequency Provider Last Rate Last Admin   ARIPiprazole  ER (ABILIFY  MAINTENA) 400 MG prefilled syringe 400 mg  400 mg Intramuscular Q28 days    400 mg at 08/11/24 1345     Musculoskeletal: Strength & Muscle Tone: within normal limits Gait & Station: normal Patient leans: N/A  Psychiatric Specialty Exam: Review of Systems  Last menstrual period 01/13/2024.There is no height or weight on file to calculate BMI.  General Appearance: Well Groomed  Eye Contact:  Good  Speech:  Clear and Coherent and Slow  Volume:  Normal  Mood:  Euthymic,   Affect:  Appropriate and Congruent  Thought Process:  Coherent, Goal Directed, and  Linear  Orientation:  Full (Time, Place, and Person)  Thought Content: WDL and Logical   Suicidal Thoughts:  No  Homicidal Thoughts:  No  Memory:  Immediate;   Good Recent;   Good Remote;   Good  Judgement:  Good  Insight:  Good  Psychomotor Activity:  Normal  Concentration:  Concentration: Good and Attention Span: Good  Recall:  Good  Fund of Knowledge: Good  Language: Good  Akathisia:  No  Handed:  Right  AIMS (if indicated):  not done,  Assets:  Communication Skills Desire for Improvement Housing Leisure Time Physical Health Social Support  ADL's:  Intact  Cognition: WNL  Sleep:  Good   Screenings: AIMS    Flowsheet Row Office Visit from 03/16/2024 in [facility] Office Visit from 01/30/2023 in [facility]  AIMS Total Score 0 2   GAD-7    Flowsheet Row Video Visit from 09/02/2024 in [facility] Video Visit from 08/04/2024 in [facility] Video Visit from 07/08/2024 in [facility] Initial Prenatal from 04/06/2024 in [facility] Office Visit from 03/16/2024 in [facility]  Total GAD-7 Score 3 20 7 7 14    PHQ2-9    Flowsheet Row Video Visit from 09/02/2024 in [facility] Video Visit from 08/04/2024 in [facility] Video Visit from 07/08/2024 in [facility] Initial Prenatal from 04/06/2024 in [facility] for [facility] [place] Office Visit from 03/16/2024 in [facility]  PHQ-2 Total Score 0 5 0 0 0  PHQ-9 Total Score 0 23 3 0 3   Flowsheet Row Admission (Discharged) from 08/22/2024 in [place] 1S Maternity Assessment Unit Admission (Discharged) from 08/17/2024 in [place] 1S Maternity Assessment Unit Admission (Discharged) from 08/14/2024 in [facility] 1S  Maternity Assessment Unit  C-SSRS RISK CATEGORY No Risk No Risk No Risk     Assessment and Plan: Patient reports that her anxiety, depression, and mood has improved.  No medication changes made today.  Patient agreeable to continue medication as prescribed.  1. Anxiety state  Continue- sertraline  (ZOLOFT ) 50 MG tablet; Take 3 tablets (150 mg total) by mouth at bedtime.  Dispense: 90 tablet; Refill: 3 Continue- hydrOXYzine  (ATARAX ) 10 MG tablet; Take 1 tablet (10 mg total) by mouth 3 (three) times daily as needed.  Dispense: 90 tablet; Refill: 3  2. Schizoaffective disorder, bipolar type (HCC)  Continue- QUEtiapine  (SEROQUEL ) 50 MG tablet; Take 1 tablet (50 mg total) by mouth at bedtime.  Dispense: 60 tablet; Refill: 1 Continue- lamoTRIgine  (LAMICTAL ) 25 MG tablet; Take 2 tablets (50 mg total) by mouth daily.  Dispense: 60 tablet; Refill: 3 Continue- ARIPiprazole  ER (ABILIFY  MAINTENA) 400 MG PRSY prefilled syringe; Inject 400 mg into the muscle every 28 (twenty-eight) days.  Dispense: 3 each; Refill: 11    Collaboration of Care: Collaboration of Care: Other provider involved in patient's care AEB counselor, PCP, shock clinic staff  Patient/Guardian was advised Release of Information must be obtained prior to any record release in order to collaborate their care with an outside provider. Patient/Guardian was advised if they have not already done so to contact the registration department to sign all necessary forms in order for us  to release information regarding their care.   Consent: Patient/Guardian gives verbal consent for treatment and assignment of benefits  for services provided during this visit. Patient/Guardian expressed understanding and agreed to proceed.   Follow-up in 2 months Follow-up with shot clinic in 1 month Zane FORBES Bach, NP 09/02/2024, 10:11 AM

## 2024-09-03 ENCOUNTER — Telehealth (HOSPITAL_COMMUNITY): Payer: Self-pay | Admitting: Psychiatry

## 2024-09-03 ENCOUNTER — Ambulatory Visit (HOSPITAL_COMMUNITY)
Admission: EM | Admit: 2024-09-03 | Discharge: 2024-09-03 | Disposition: A | Discharge status: Home / Self Care | Attending: Behavioral Health | Admitting: Behavioral Health

## 2024-09-03 DIAGNOSIS — F250 Schizoaffective disorder, bipolar type: Secondary | ICD-10-CM | POA: Diagnosis not present

## 2024-09-03 DIAGNOSIS — Z3A35 35 weeks gestation of pregnancy: Secondary | ICD-10-CM | POA: Diagnosis not present

## 2024-09-03 DIAGNOSIS — R4183 Borderline intellectual functioning: Secondary | ICD-10-CM | POA: Diagnosis not present

## 2024-09-03 DIAGNOSIS — Z7689 Persons encountering health services in other specified circumstances: Secondary | ICD-10-CM

## 2024-09-03 DIAGNOSIS — O99343 Other mental disorders complicating pregnancy, third trimester: Secondary | ICD-10-CM | POA: Insufficient documentation

## 2024-09-03 DIAGNOSIS — F259 Schizoaffective disorder, unspecified: Secondary | ICD-10-CM

## 2024-09-03 DIAGNOSIS — Z008 Encounter for other general examination: Secondary | ICD-10-CM | POA: Diagnosis not present

## 2024-09-03 DIAGNOSIS — R1110 Vomiting, unspecified: Secondary | ICD-10-CM | POA: Diagnosis present

## 2024-09-03 NOTE — ED Provider Notes (Addendum)
 Behavioral Health Urgent Care Medical Screening Exam  Patient Name: Nicole [name] MRN: [id number] Date of Evaluation: 09/03/24 Chief Complaint: I am having trouble eating and feeling nauseous.  Diagnosis:  Final diagnoses:  Encounter for psychiatric assessment    History of Present illness: Nicole [name] is a 33 y.o. female patient with a past psychiatric history significant for schizoaffective disorder, depression, anxiety, insomnia, borderline intellectual functioning and [redacted] weeks pregnant who presented to the [facility] voluntary accompanied by [employer].   Per Triage, Nicole [name] is a 32Y female presenting to [facility] vol via [employer]. [employer] reports an outpatient therapist on the 2nd floor here is expecting Pt upstairs. Pt states that she is in need of some Prozac. Pt states that she has never taken Prozac but she thinks it will fix what is going on. Pt has a hx of Schizoaffective Disorder, bipolar type, depression, and anxiety. Pt denies SI, HI, AVH, and substance use.  Per chart review on 09/03/24: Per Zane Bach, NP., PT called this afternoon sounding manic and requesting prozac - pt stated that she was going through a crisis - PT stated she [place] [employer], I informed the PT I could call non emergent number - PT agreed to that and having the [facility] - team come - PT was agreed. Called with PT on the phone - dispatch stated they would send someone out as soon as possible.I offered to stay on the phone with the PT but PT declined, I informed the PT that I woul call her within 10 minutes to check on her.   On approach, patient states that she's here because she needs a medication to help her not feel nauseous. She reports feeling like she has to throw up but nothing comes out. When asked if she has expressed her concerns to her OB doctor, she states that her doctor gave her a medication for nausea and that sometimes the medication helps. I  discussed with the patient to continue talking medication as prescribed by her OB doctor for nausea. I also informed the patient that she is prescribed vistaril  10 mg as needed for anxiety which also helps with nausea. Patient denies excessive vomiting, vaginal bleeding, abdominal pain or cramping.   On evaluation, patient is alert and oriented x 4. Her thought process is linear. Thought content is negative for SI, HI and AVH. Objectively, no signs of acute psychosis. Her speech is clear and coherent at a normal rate. Her mood is euthymic and affect is appropriate. She denies depressive symptoms. She denies difficulty sleeping. She reports poor appetite due to feeling nauseous. She denies recent weight loss. She denies racing thoughts, talking fast, increase energy or risky behaviors. She is calm and cooperative. She denies using illicit drugs or drinking alcohol. She reports smoking one cigarette per day. She states that she lives alone in an apartment. She denies access to firearms.   She reports medication compliance. She denies medication side effects. Patient is established with the [facility] outpatient clinic for medication management and is prescribed Abilify  maintainer 400 mg, every 28 days, trazodone  100 mg nightly as needed, Seroquel  50 mg nightly, Zoloft  150 mg daily, hydroxyzine  10 mg three times daily as needed, and Lamictal  50 mg daily.    I spoke to the patient's father/legal guardian Mr. Clem to notify him that the patient was brought in by the police with complaints of nausea.  He was informed that the patient is not  experiencing any SI/HI, mania, depression or hallucinations on exam. He denies any safety concerns with the patient discharging and returning back to her apartment this evening. He was advised that the patient will be transferred back home via taxi.  Flowsheet Row ED from 09/03/2024 in [facility] Admission (Discharged) from 08/22/2024 in [place] 1S  Maternity Assessment Unit Admission (Discharged) from 08/17/2024 in [facility] 1S Maternity Assessment Unit  C-SSRS RISK CATEGORY No Risk No Risk No Risk    Psychiatric Specialty Exam  Presentation  General Appearance:Appropriate for Environment  Eye Contact:Fair  Speech:Clear and Coherent  Speech Volume:Normal  Handedness:Right   Mood and Affect  Mood: Euthymic  Affect: Congruent   Thought Process  Thought Processes: Coherent  Descriptions of Associations:Intact  Orientation:Full (Time, Place and Person)  Thought Content:Logical  Diagnosis of Schizophrenia or Schizoaffective disorder in past: Yes  Duration of Psychotic Symptoms: Greater than six months  Hallucinations:None denies voices  Ideas of Reference:None  Suicidal Thoughts:No With Intent  Homicidal Thoughts:No With Intent   Sensorium  Memory: Immediate Fair; Recent Fair; Remote Fair  Judgment: Intact  Insight: Present   Executive Functions  Concentration: Fair  Attention Span: Fair  Recall: [employer] of Knowledge: Fair  Language: Fair   Psychomotor Activity  Psychomotor Activity: Normal   Assets  Assets: Manufacturing Systems Engineer; Desire for Improvement; Housing; Social Support   Sleep  Sleep: Fair   Physical Exam: Physical Exam Cardiovascular:     Rate and Rhythm: Normal rate.  Pulmonary:     Effort: Pulmonary effort is normal.  Musculoskeletal:        General: Normal range of motion.     Cervical back: Normal range of motion.  Neurological:     Mental Status: She is alert and oriented to person, place, and time.    Review of Systems  Constitutional: Negative.   HENT: Negative.    Cardiovascular: Negative.   Gastrointestinal:  Positive for nausea.  Musculoskeletal: Negative.   Neurological: Negative.    Blood pressure 103/77, pulse 74, temperature 98.9 F (37.2 C), temperature source Oral, resp. rate 18, last menstrual period 01/13/2024, SpO2 99%. There  is no height or weight on file to calculate BMI.  Musculoskeletal: Strength & Muscle Tone: within normal limits Gait & Station: normal Patient leans: N/A   [facility] MSE Discharge Disposition for Follow up and Recommendations: Based on my evaluation the patient does not appear to have an emergency medical condition and can be discharged with resources and follow up care in outpatient services for Medication Management and Individual Therapy Discharge recommendations:   Based on your presentation and complaints of nausea please take nausea medicine as prescribed by your [facility] physician. If you are experiencing excessive vomiting please go to the nearest emergency department for a medical evaluation. You are prescribed Vistaril  10 mg as needed for anxiety which may also help with nausea.  Medications: Patient is to take medications as prescribed. No medication changes were made during your visit today. The patient or patient's guardian is to contact a medical professional and/or outpatient provider to address any new side effects that develop. The patient or the patient's guardian should update outpatient providers of any new medications and/or medication changes.   Outpatient Follow up: You are encouraged to follow up with [facility] for outpatient treatment for medication management. Your next appointment is scheduled for September 08, 2024.  [facility] [address] [place], [place] [phone]  Safety:   The  following safety precautions should be taken:   No sharp objects. This includes scissors, razors, scrapers, and putty knives.   Chemicals should be removed and locked up.   Medications should be removed and locked up.   Weapons should be removed and locked up. This includes firearms, knives and instruments that can be used to cause injury.   The patient should abstain from use of illicit substances/drugs and abuse of any medications.  If  symptoms worsen or do not continue to improve or if the patient becomes actively suicidal or homicidal then it is recommended that the patient return to the closest hospital emergency department, the [facility], or call 911 for further evaluation and treatment. National Suicide Prevention Lifeline 1-800-SUICIDE or 7132243379.  About 988 988 offers 24/7 access to trained crisis counselors who can help people experiencing mental health-related distress. People can call or text 988 or chat 988lifeline.org for themselves or if they are worried about a loved one who may need crisis support.    Teresa Wyline CROME, NP 09/03/2024, 3:39 PM

## 2024-09-03 NOTE — Discharge Instructions (Addendum)
 Discharge recommendations:   Based on your presentation and complaints of nausea please take nausea medicine as prescribed by your Nicole [name] [facility] physician. If you are experiencing excessive vomiting please go to the nearest emergency department for a medical evaluation. You are prescribed Vistaril  10 mg as needed for anxiety which may also help with nausea.  Medications: Patient is to take medications as prescribed. No medication changes were made during your visit today. The patient or patient's guardian is to contact a medical professional and/or outpatient provider to address any new side effects that develop. The patient or the patient's guardian should update outpatient providers of any new medications and/or medication changes.   Outpatient Follow up: You are encouraged to follow up with [facility] for outpatient treatment for medication management. Your next appointment is scheduled for September 08, 2024.  [facility] [address] [place], [place] [phone]  Safety:   The following safety precautions should be taken:   No sharp objects. This includes scissors, razors, scrapers, and putty knives.   Chemicals should be removed and locked up.   Medications should be removed and locked up.   Weapons should be removed and locked up. This includes firearms, knives and instruments that can be used to cause injury.   The patient should abstain from use of illicit substances/drugs and abuse of any medications.  If symptoms worsen or do not continue to improve or if the patient becomes actively suicidal or homicidal then it is recommended that the patient return to the closest hospital emergency department, the [facility], or call 911 for further evaluation and treatment. National Suicide Prevention Lifeline 1-800-SUICIDE or 928-422-0894.  About 988 988 offers 24/7 access to trained crisis counselors who can help people  experiencing mental health-related distress. People can call or text 988 or chat 988lifeline.org for themselves or if they are worried about a loved one who may need crisis support.

## 2024-09-03 NOTE — Progress Notes (Signed)
   09/03/24 1356  [facility] Triage Screening (Walk-ins at [facility] only)  What Is the Reason for Your Visit/Call Today? Nicole [name] is a 32Y female presenting to [facility] vol via [facility]. [employer] reports an outpatient therapist on the 2nd floor here is expecting Pt upstairs. Pt states that she is in need of some Prozac. Pt states that she has never taken Prozac but she thinks it will fix what is going on. Pt has a hx of Schizoaffective Disorder, bipolar type, depression, and anxiety. Pt denies SI, HI, AVH, and substance use.  How Long Has This Been Causing You Problems? <Week  Have You Recently Had Any Thoughts About Hurting Yourself? No  Are You Planning to Commit Suicide/Harm Yourself At This time? No  Have you Recently Had Thoughts About Hurting Someone Sherral? No  Are You Planning To Harm Someone At This Time? No  Physical Abuse Denies  Verbal Abuse Denies  Sexual Abuse Denies  Exploitation of patient/patient's resources Denies  Self-Neglect Denies  Are you currently experiencing any auditory, visual or other hallucinations? No  Have You Used Any Alcohol or Drugs in the Past 24 Hours? No  Do you have any current medical co-morbidities that require immediate attention? No  What Do You Feel Would Help You the Most Today? Medication(s)  Determination of Need Routine (7 days)  Options For Referral Medication Management;Outpatient Therapy

## 2024-09-06 ENCOUNTER — Encounter: Admitting: Obstetrics and Gynecology

## 2024-09-06 NOTE — Telephone Encounter (Signed)
 Provider called patient and was informed that she has been experiencing nausea.  She notes that she had times she induces vomiting and other times she is just nauseous.  She informed clinical research associate that her OB gave her medications for nausea however she notes that it is effective sometimes and other times it does not.  Provider informed patient that hydroxyzine  could also be increased.  She notes that at this time she does not wish it to be increased.  She does note that her current medications are effective in managing her mental health conditions.  At this time no medication changes made.  Patient agreeable to continue medication as prescribed.  Patient reports that she might be interested in medication adjustment after delivering her child.

## 2024-09-07 ENCOUNTER — Encounter: Payer: Self-pay | Admitting: Obstetrics & Gynecology

## 2024-09-07 ENCOUNTER — Ambulatory Visit (INDEPENDENT_AMBULATORY_CARE_PROVIDER_SITE_OTHER): Admitting: Obstetrics & Gynecology

## 2024-09-07 VITALS — BP 102/72 | HR 94 | Wt 200.2 lb

## 2024-09-07 DIAGNOSIS — R8271 Bacteriuria: Secondary | ICD-10-CM

## 2024-09-07 DIAGNOSIS — R946 Abnormal results of thyroid function studies: Secondary | ICD-10-CM | POA: Diagnosis not present

## 2024-09-07 DIAGNOSIS — I10 Essential (primary) hypertension: Secondary | ICD-10-CM

## 2024-09-07 DIAGNOSIS — O0993 Supervision of high risk pregnancy, unspecified, third trimester: Secondary | ICD-10-CM

## 2024-09-07 DIAGNOSIS — Z3A34 34 weeks gestation of pregnancy: Secondary | ICD-10-CM

## 2024-09-07 DIAGNOSIS — O24112 Pre-existing diabetes mellitus, type 2, in pregnancy, second trimester: Secondary | ICD-10-CM

## 2024-09-07 DIAGNOSIS — F209 Schizophrenia, unspecified: Secondary | ICD-10-CM

## 2024-09-07 DIAGNOSIS — O0990 Supervision of high risk pregnancy, unspecified, unspecified trimester: Secondary | ICD-10-CM

## 2024-09-07 NOTE — Progress Notes (Signed)
 NST  DM2; pt does not have her blood sugar log with her today.   Pt would like to discuss induction with provider.

## 2024-09-07 NOTE — Progress Notes (Signed)
 PRENATAL VISIT NOTE  Subjective:  Nicole [name] is a 33 y.o. G2P1001 at [redacted]w[redacted]d being seen today for ongoing prenatal care.  She is currently monitored for the following issues for this high-risk pregnancy and has Schizophrenia, unspecified (HCC); MDD (major depressive disorder), recurrent episode, moderate (HCC); Hypothyroidism; Tobacco use disorder; Borderline intellectual functioning; FAS (fetal alcohol syndrome); Anxiety state; Insomnia due to other mental disorder; Schizoaffective disorder, bipolar type (HCC); Essential hypertension; Type 2 diabetes mellitus affecting pregnancy in second trimester, antepartum; Supervision of high risk pregnancy, antepartum; Class 1 obesity due to excess calories with serious comorbidity and body mass index (BMI) of 33.0 to 33.9 in adult; History of gestational diabetes mellitus (GDM) in prior pregnancy, currently pregnant; Abnormal thyroid  function test; Tobacco smoking affecting pregnancy; Gonorrhea in pregnancy; and GBS bacteriuria on their problem list.  Patient reports no complaints.  Contractions: Not present. Vag. Bleeding: None.  Movement: Present. Denies leaking of fluid.   The following portions of the patient's history were reviewed and updated as appropriate: allergies, current medications, past family history, past medical history, past social history, past surgical history and problem list.   Objective:   Vitals:   09/07/24 1036  BP: 102/72  Pulse: 94  Weight: 200 lb 3.2 oz (90.8 kg)    Fetal Status:  Fetal Heart Rate (bpm): 154   Movement: Present    General: Alert, oriented and cooperative. Patient is in no acute distress.  Skin: Skin is warm and dry. No rash noted.   Cardiovascular: Normal heart rate noted  Respiratory: Normal respiratory effort, no problems with respiration noted  Abdomen: Soft, gravid, appropriate for gestational age.  Pain/Pressure: Absent     Pelvic: Cervical exam deferred        Extremities: Normal range of  motion.  Edema: None  Mental Status: Normal mood and affect. Normal behavior. Normal judgment and thought content.      09/02/2024    9:58 AM 08/04/2024    8:44 AM 07/08/2024   10:34 AM  Depression screen PHQ 2/9  Decreased Interest     Down, Depressed, Hopeless     PHQ - 2 Score     Altered sleeping     Tired, decreased energy     Change in appetite     Feeling bad or failure about yourself      Trouble concentrating     Moving slowly or fidgety/restless     Suicidal thoughts     PHQ-9 Score     Difficult doing work/chores        Information is confidential and restricted. Go to Review Flowsheets to unlock data.        09/02/2024   10:00 AM 08/04/2024    8:44 AM 07/08/2024   10:35 AM 04/06/2024    3:20 PM  GAD 7 : Generalized Anxiety Score  Nervous, Anxious, on Edge    1  Control/stop worrying    1  Worry too much - different things    1  Trouble relaxing    1  Restless    0  Easily annoyed or irritable    2  Afraid - awful might happen    1  Total GAD 7 Score    7  Anxiety Difficulty         Information is confidential and restricted. Go to Review Flowsheets to unlock data.    Assessment and Plan:  Pregnancy: G2P1001 at [redacted]w[redacted]d 1. [redacted] weeks gestation of pregnancy (Primary) Reactive NST - Fetal  nonstress test; Future  2. Supervision of high risk pregnancy, antepartum  - Fetal nonstress test; Future  3. Abnormal thyroid  function test   4. GBS bacteriuria   5. Essential hypertension   6. Type 2 diabetes mellitus affecting pregnancy in second trimester, antepartum Normal A1c, no 2 hr gtt, state most values in range occasional elevated PP up to 150 but she does not have her log. She told Dr. Arna in Aug she does not have diabetes and I doubt that she does  7. Schizophrenia, unspecified type (HCC) Under care with psychiatry  Preterm labor symptoms and general obstetric precautions including but not limited to vaginal bleeding, contractions, leaking of  fluid and fetal movement were reviewed in detail with the patient. Please refer to After Visit Summary for other counseling recommendations.   Return in about 2 weeks (around 09/21/2024).  Future Appointments  Date Time Provider Department Center  09/08/2024 10:30 AM [facility] NURSE [facility] None  09/14/2024  9:35 AM Constant, Winton, MD [facility] None  09/21/2024  9:35 AM Erik Kieth BROCKS, MD [facility] None  09/27/2024 10:15 AM [facility] PROVIDER 1 [facility]  09/27/2024 10:30 AM [facility] US2 [facility]  09/28/2024 10:00 AM Arlinda Buster, MD [facility] None  10/05/2024  9:15 AM Erik Kieth BROCKS, MD [facility] None  11/10/2024  8:00 AM Harl Zane BRAVO, NP [facility] None    Lynwood Solomons, MD

## 2024-09-08 ENCOUNTER — Ambulatory Visit (HOSPITAL_COMMUNITY)

## 2024-09-13 ENCOUNTER — Encounter (HOSPITAL_COMMUNITY): Payer: Self-pay | Admitting: Obstetrics and Gynecology

## 2024-09-13 ENCOUNTER — Inpatient Hospital Stay (HOSPITAL_COMMUNITY)
Admission: AD | Admit: 2024-09-13 | Discharge: 2024-09-13 | Disposition: A | Discharge status: Home / Self Care | Attending: Obstetrics and Gynecology | Admitting: Obstetrics and Gynecology

## 2024-09-13 ENCOUNTER — Ambulatory Visit: Admitting: Obstetrics

## 2024-09-13 ENCOUNTER — Encounter: Payer: Self-pay | Admitting: Obstetrics

## 2024-09-13 VITALS — BP 111/77 | HR 88 | Wt 201.7 lb

## 2024-09-13 DIAGNOSIS — R8271 Bacteriuria: Secondary | ICD-10-CM | POA: Diagnosis not present

## 2024-09-13 DIAGNOSIS — Z3A34 34 weeks gestation of pregnancy: Secondary | ICD-10-CM

## 2024-09-13 DIAGNOSIS — B009 Herpesviral infection, unspecified: Secondary | ICD-10-CM

## 2024-09-13 DIAGNOSIS — O98213 Gonorrhea complicating pregnancy, third trimester: Secondary | ICD-10-CM

## 2024-09-13 DIAGNOSIS — O368130 Decreased fetal movements, third trimester, not applicable or unspecified: Secondary | ICD-10-CM | POA: Diagnosis not present

## 2024-09-13 DIAGNOSIS — E039 Hypothyroidism, unspecified: Secondary | ICD-10-CM | POA: Insufficient documentation

## 2024-09-13 DIAGNOSIS — O24119 Pre-existing diabetes mellitus, type 2, in pregnancy, unspecified trimester: Secondary | ICD-10-CM | POA: Diagnosis not present

## 2024-09-13 DIAGNOSIS — O26893 Other specified pregnancy related conditions, third trimester: Secondary | ICD-10-CM

## 2024-09-13 DIAGNOSIS — E02 Subclinical iodine-deficiency hypothyroidism: Secondary | ICD-10-CM

## 2024-09-13 DIAGNOSIS — I10 Essential (primary) hypertension: Secondary | ICD-10-CM

## 2024-09-13 DIAGNOSIS — F17200 Nicotine dependence, unspecified, uncomplicated: Secondary | ICD-10-CM

## 2024-09-13 DIAGNOSIS — F209 Schizophrenia, unspecified: Secondary | ICD-10-CM

## 2024-09-13 DIAGNOSIS — R300 Dysuria: Secondary | ICD-10-CM

## 2024-09-13 DIAGNOSIS — O98519 Other viral diseases complicating pregnancy, unspecified trimester: Secondary | ICD-10-CM

## 2024-09-13 DIAGNOSIS — O99283 Endocrine, nutritional and metabolic diseases complicating pregnancy, third trimester: Secondary | ICD-10-CM | POA: Diagnosis present

## 2024-09-13 DIAGNOSIS — E119 Type 2 diabetes mellitus without complications: Secondary | ICD-10-CM | POA: Diagnosis not present

## 2024-09-13 DIAGNOSIS — O24113 Pre-existing diabetes mellitus, type 2, in pregnancy, third trimester: Secondary | ICD-10-CM | POA: Diagnosis not present

## 2024-09-13 DIAGNOSIS — O0990 Supervision of high risk pregnancy, unspecified, unspecified trimester: Secondary | ICD-10-CM

## 2024-09-13 DIAGNOSIS — O99210 Obesity complicating pregnancy, unspecified trimester: Secondary | ICD-10-CM

## 2024-09-13 LAB — POCT URINALYSIS DIPSTICK
Bilirubin, UA: NEGATIVE
Blood, UA: NEGATIVE
Glucose, UA: NEGATIVE
Ketones, UA: NEGATIVE
Nitrite, UA: NEGATIVE
Protein, UA: POSITIVE — AB
Spec Grav, UA: 1.015 (ref 1.010–1.025)
Urobilinogen, UA: 0.2 U/dL
pH, UA: 7 (ref 5.0–8.0)

## 2024-09-13 LAB — URINALYSIS, ROUTINE W REFLEX MICROSCOPIC
Bilirubin Urine: NEGATIVE
Glucose, UA: NEGATIVE mg/dL
Hgb urine dipstick: NEGATIVE
Ketones, ur: NEGATIVE mg/dL
Leukocytes,Ua: NEGATIVE
Nitrite: NEGATIVE
Protein, ur: NEGATIVE mg/dL
Specific Gravity, Urine: 1.004 — ABNORMAL LOW (ref 1.005–1.030)
pH: 7 (ref 5.0–8.0)

## 2024-09-13 NOTE — MAU Note (Signed)
 Nicole [name] is a 33 y.o. at [redacted]w[redacted]d here in MAU reporting: reported decreased fetal movement at doctors office. Did NST and only had some 10x10's. Sent for prolonged monitoring.  Reports some back pain but no ctx.  Took muscle relaxer for back pain but they don't really work . Denies any vag bleeding or discharge.   LMP:  Onset of complaint: today Pain score: 9 Vitals:   09/13/24 1332  BP: 114/67  Pulse: 94  Resp: 18  Temp: 98.8 F (37.1 C)     FHT: 155  Lab orders placed from triage: U/A

## 2024-09-13 NOTE — Progress Notes (Signed)
 Pt presents for rob.pt complains of back pain, vaginal pain, and urine odor.

## 2024-09-13 NOTE — Progress Notes (Signed)
 Subjective:  Nicole [name] is a 33 y.o. G2P1001 at [redacted]w[redacted]d being seen today for ongoing prenatal care.  She is currently monitored for the following issues for this high-risk pregnancy and has Schizophrenia, unspecified (HCC); MDD (major depressive disorder), recurrent episode, moderate (HCC); Hypothyroidism; Tobacco use disorder; Borderline intellectual functioning; FAS (fetal alcohol syndrome); Anxiety state; Insomnia due to other mental disorder; Schizoaffective disorder, bipolar type (HCC); Essential hypertension; Type 2 diabetes mellitus affecting pregnancy in second trimester, antepartum; Supervision of high risk pregnancy, antepartum; Class 1 obesity due to excess calories with serious comorbidity and body mass index (BMI) of 33.0 to 33.9 in adult; History of gestational diabetes mellitus (GDM) in prior pregnancy, currently pregnant; Abnormal thyroid  function test; Tobacco smoking affecting pregnancy; Gonorrhea in pregnancy; and GBS bacteriuria on their problem list.  Patient reports malodorous urine.and back pain.  Contractions: Not present. Vag. Bleeding: None.  Movement: Present. Denies leaking of fluid.   The following portions of the patient's history were reviewed and updated as appropriate: allergies, current medications, past family history, past medical history, past social history, past surgical history and problem list. Problem list updated.  Objective:   Vitals:   09/13/24 1120  BP: 111/77  Pulse: 88  Weight: 201 lb 11.2 oz (91.5 kg)    Fetal Status:     Movement: Present     General:  Alert, oriented and cooperative. Patient is in no acute distress.  Skin: Skin is warm and dry. No rash noted.   Cardiovascular: Normal heart rate noted  Respiratory: Normal respiratory effort, no problems with respiration noted  Abdomen: Soft, gravid, appropriate for gestational age. Pain/Pressure: Present     Pelvic:  Cervical exam deferred        Extremities: Normal range of motion.   Edema: None  Mental Status: Normal mood and affect. Normal behavior. Normal judgment and thought content.   Urinalysis:      Assessment and Plan:  Pregnancy: G2P1001 at [redacted]w[redacted]d  1. Supervision of high risk pregnancy, antepartum (Primary)  2. Type 2 diabetes mellitus affecting pregnancy, antepartum - good glucose control:  FBS < 100   and   2 Hour PP < 120 Rx: - Fetal nonstress test:  Non Reactive.  FHR 140's.  Fair variability.  10x10 accels.  No decels. - patient sent to MAU for further evaluation  3. GBS bacteriuria, treated - treat in labor  4. Herpes simplex type 2 (HSV-2) infection affecting pregnancy, antepartum - on Valtrex  suppression  5. Subclinical iodine-deficiency hypothyroidism - clinically stable  6. Schizophrenia, unspecified type (HCC) - clinically stable  7. Essential hypertension - BP clinically stable  8. Tobacco use disorder - cessation encouraged  9. Obesity affecting pregnancy, antepartum, unspecified obesity type  10. Dysuria during pregnancy in third trimester Rx: - Urine Culture - POCT Urinalysis Dipstick    Preterm labor symptoms and general obstetric precautions including but not limited to vaginal bleeding, contractions, leaking of fluid and fetal movement were reviewed in detail with the patient. Please refer to After Visit Summary for other counseling recommendations.   Return in about 1 week (around 09/20/2024) for [facility].  NST.   Rudy Carlin LABOR, MD 09/13/2024
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
## 2024-09-14 ENCOUNTER — Encounter: Admitting: Obstetrics and Gynecology

## 2024-09-14 ENCOUNTER — Inpatient Hospital Stay (HOSPITAL_COMMUNITY)
Admit: 2024-09-14 | Discharge: 2024-09-15 | Disposition: A | Payer: Self-pay | Attending: Obstetrics and Gynecology | Admitting: Obstetrics and Gynecology

## 2024-09-14 ENCOUNTER — Encounter (HOSPITAL_COMMUNITY): Payer: Self-pay | Admitting: Obstetrics and Gynecology

## 2024-09-14 DIAGNOSIS — Z3A35 35 weeks gestation of pregnancy: Secondary | ICD-10-CM

## 2024-09-14 DIAGNOSIS — O4703 False labor before 37 completed weeks of gestation, third trimester: Secondary | ICD-10-CM

## 2024-09-14 LAB — URINALYSIS, ROUTINE W REFLEX MICROSCOPIC
Bilirubin Urine: NEGATIVE
Glucose, UA: NEGATIVE mg/dL
Hgb urine dipstick: NEGATIVE
Ketones, ur: NEGATIVE mg/dL
Leukocytes,Ua: NEGATIVE
Nitrite: NEGATIVE
Protein, ur: NEGATIVE mg/dL
Specific Gravity, Urine: 1.006 (ref 1.005–1.030)
pH: 7 (ref 5.0–8.0)

## 2024-09-14 NOTE — MAU Note (Signed)
..  Nicole [name] is a 33 y.o. at [redacted]w[redacted]d here in MAU reporting: contractions since 1800 that are now every 15 minutes.  Denies leaking of fluid or vaginal bleeding.  +FM  Pain score: 9/10 Vitals:   09/14/24 2319 09/14/24 2320  BP: 110/64   Pulse: 96   Resp: 17   Temp: 98.1 F (36.7 C)   SpO2: 99% 99%     FHT: 140 Lab orders placed from triage: UA

## 2024-09-15 ENCOUNTER — Other Ambulatory Visit (HOSPITAL_COMMUNITY): Payer: Self-pay | Admitting: Psychiatry

## 2024-09-15 ENCOUNTER — Telehealth (HOSPITAL_COMMUNITY): Payer: Self-pay | Admitting: Psychiatry

## 2024-09-15 DIAGNOSIS — Z3A35 35 weeks gestation of pregnancy: Secondary | ICD-10-CM | POA: Diagnosis not present

## 2024-09-15 DIAGNOSIS — F250 Schizoaffective disorder, bipolar type: Secondary | ICD-10-CM

## 2024-09-15 DIAGNOSIS — O4703 False labor before 37 completed weeks of gestation, third trimester: Secondary | ICD-10-CM

## 2024-09-15 MED ORDER — ABILIFY MAINTENA 400 MG IM PRSY: 400.0000 mg | PREFILLED_SYRINGE | INTRAMUSCULAR | 11 refills | Status: DC

## 2024-09-15 NOTE — MAU Provider Note (Signed)
 Chief Complaint:  Contractions   Event Date/Time   First Provider Initiated Contact with Patient 09/15/24 0038      HPI: Nicole [name] is a 33 y.o. G2P1001 at [redacted]w[redacted]d who presents to maternity admissions via EMS reporting contractions every 15 minutes since 6 pm.  She could not get comfortable with abdominal and back pain at home.  She reports intercourse within the last 24 hours. She reports good fetal movement, denies LOF, vaginal bleeding, vaginal itching/burning, urinary symptoms, h/a, dizziness, n/v, or fever/chills.    HPI  Past Medical History: Past Medical History:  Diagnosis Date   Anxiety    Depression    Diabetes mellitus without complication (HCC)    HSV-2 (herpes simplex virus 2) infection    Hypothyroidism 08/07/2022   Schizophrenia (HCC)    takes abilify    Tobacco use disorder 08/07/2022    Past obstetric history: OB History  Gravida Para Term Preterm AB Living  2 1 1  0 0 1  SAB IAB Ectopic Multiple Live Births  0 0 0 0 1    # Outcome Date GA Lbr Len/2nd Weight Sex Type Anes PTL Lv  2 Current           1 Term 03/25/18    F Vag-Spont   LIV    Past Surgical History: Past Surgical History:  Procedure Laterality Date   NO PAST SURGERIES     WISDOM TOOTH EXTRACTION Bilateral 2020    Family History: Family History  Problem Relation Age of Onset   Hypertension Father    Diabetes Father     Social History: Social History   Tobacco Use   Smoking status: Some Days    Current packs/day: 0.00    Average packs/day: 0.3 packs/day for 1 year (0.3 ttl pk-yrs)    Types: Cigarettes    Start date: 2021    Last attempt to quit: 02/17/2024    Years since quitting: 0.5   Smokeless tobacco: Current  Vaping Use   Vaping status: Former   Quit date: 01/13/2024   Substances: Flavoring  Substance Use Topics   Alcohol use: Not Currently    Comment: weekly   Drug use: Not Currently    Types: Marijuana, Other-see comments    Comment: Xanax    Allergies:   Allergies  Allergen Reactions   Apple Juice Anaphylaxis and Other (See Comments)   Other Anaphylaxis and Other (See Comments)    SALMON   Watermelon Flavoring Agent (Non-Screening) Anaphylaxis   Apple (Diagnostic)     Other Reaction(s): Unknown   Fish Oil     Other Reaction(s): Unknown    Meds:  Facility-Administered Medications Prior to Admission  Medication Dose Route Frequency Provider Last Rate Last Admin   ARIPiprazole  ER (ABILIFY  MAINTENA) 400 MG prefilled syringe 400 mg  400 mg Intramuscular Q28 days    400 mg at 08/11/24 1345   Medications Prior to Admission  Medication Sig Dispense Refill Last Dose/Taking   ARIPiprazole  ER (ABILIFY  MAINTENA) 400 MG PRSY prefilled syringe Inject 400 mg into the muscle every 28 (twenty-eight) days. 3 each 11 Past Month   aspirin  EC 81 MG tablet Take 1 tablet (81 mg total) by mouth daily. Swallow whole. 90 tablet 5 09/14/2024   cyclobenzaprine  (FLEXERIL ) 5 MG tablet Take 1 tablet (5 mg total) by mouth 3 (three) times daily as needed for muscle spasms. 20 tablet 0 Past Week   hydrOXYzine  (ATARAX ) 10 MG tablet Take 1 tablet (10 mg total) by mouth 3 (  three) times daily as needed. 90 tablet 3 09/14/2024   lamoTRIgine  (LAMICTAL ) 25 MG tablet Take 2 tablets (50 mg total) by mouth daily. 60 tablet 3 09/14/2024   Prenatal 28-0.8 MG TABS Take 1 tablet by mouth daily. 90 tablet 12 09/14/2024   QUEtiapine  (SEROQUEL ) 50 MG tablet Take 1 tablet (50 mg total) by mouth at bedtime. 30 tablet 3 09/14/2024   sertraline  (ZOLOFT ) 50 MG tablet Take 3 tablets (150 mg total) by mouth at bedtime. 90 tablet 3 09/14/2024   valACYclovir  (VALTREX ) 500 MG tablet Take 1 tablet (500 mg total) by mouth daily. 30 tablet 8 09/14/2024   Accu-Chek Softclix Lancets lancets Use as instructed 100 each 12    Blood Glucose Monitoring Suppl (ACCU-CHEK GUIDE) w/Device KIT 1 Device by Does not apply route 4 (four) times daily. 1 kit 0    famotidine  (PEPCID ) 40 MG tablet Take 1 tablet (40 mg  total) by mouth daily. (Patient not taking: Reported on 09/13/2024) 90 tablet 3    glucose blood (ACCU-CHEK GUIDE TEST) test strip Use as instructed 100 each 12    ondansetron  (ZOFRAN -ODT) 8 MG disintegrating tablet Take 0.5 tablets (4 mg total) by mouth every 6 (six) hours as needed for nausea or vomiting. 20 tablet 0     ROS:  Review of Systems  Constitutional:  Negative for chills, fatigue and fever.  Eyes:  Negative for visual disturbance.  Respiratory:  Negative for shortness of breath.   Cardiovascular:  Negative for chest pain.  Gastrointestinal:  Positive for abdominal pain. Negative for nausea and vomiting.  Genitourinary:  Negative for difficulty urinating, dysuria, flank pain, pelvic pain, vaginal bleeding, vaginal discharge and vaginal pain.  Musculoskeletal:  Positive for back pain.  Neurological:  Negative for dizziness and headaches.  Psychiatric/Behavioral: Negative.       I have reviewed patient's Past Medical Hx, Surgical Hx, Family Hx, Social Hx, medications and allergies.   Physical Exam  Patient Vitals for the past 24 hrs:  BP Temp Temp src Pulse Resp SpO2  09/15/24 0231 119/76 -- -- 82 -- --  09/14/24 2320 -- -- -- -- -- 99 %  09/14/24 2319 110/64 98.1 F (36.7 C) Oral 96 17 99 %   Constitutional: Well-developed, well-nourished female in no acute distress.  Cardiovascular: normal rate Respiratory: normal effort GI: Abd soft, non-tender, gravid appropriate for gestational age.  MS: Extremities nontender, no edema, normal ROM Neurologic: Alert and oriented x 4.  GU: Neg CVAT.    Dilation: 1 Effacement (%): 50 Station: -3 Presentation: Vertex Exam by:: Olam, CNM  FHT:  Baseline 150 , moderate variability, accelerations present, no decelerations Contractions: 1-15 minutes, irregular, mild to palpation   Labs: Results for orders placed or performed during the hospital encounter of 09/14/24 (from the past 24 hours)  Urinalysis, Routine w reflex  microscopic -Urine, Clean Catch     Status: None   Collection Time: 09/14/24 11:20 PM  Result Value Ref Range   Color, Urine YELLOW YELLOW   APPearance CLEAR CLEAR   Specific Gravity, Urine 1.006 1.005 - 1.030   pH 7.0 5.0 - 8.0   Glucose, UA NEGATIVE NEGATIVE mg/dL   Hgb urine dipstick NEGATIVE NEGATIVE   Bilirubin Urine NEGATIVE NEGATIVE   Ketones, ur NEGATIVE NEGATIVE mg/dL   Protein, ur NEGATIVE NEGATIVE mg/dL   Nitrite NEGATIVE NEGATIVE   Leukocytes,Ua NEGATIVE NEGATIVE   O/Positive/-- (10/27 1022)  Imaging:    MAU Course/MDM: Orders Placed This Encounter  Procedures   Urinalysis,  Routine w reflex microscopic -Urine, Clean Catch   Discharge patient Discharge disposition: 01-Home or Self Care; Discharge patient date: 09/15/2024    No orders of the defined types were placed in this encounter.    NST reviewed and reactive Cervix 1 cm, unchanged in ~2 hours in MAU Pt reports improved contractions while in MAU Ctx after intercourse, reviewed with patient that sex and especially semen can cause painful contractions in the third trimester D/C home with PTL precautions  Assessment: 1. Threatened preterm labor, third trimester   2. [redacted] weeks gestation of pregnancy     Plan: Discharge home Labor precautions and fetal kick counts  Follow-up Information     [facility] Follow up.   Specialty: Obstetrics and Gynecology Why: As needed for signs of labor or emergencies Contact information: [address] [place]  [phone]               Allergies as of 09/15/2024       Reactions   Apple Juice Anaphylaxis, Other (See Comments)   Other Anaphylaxis, Other (See Comments)   SALMON   Watermelon Flavoring Agent (non-screening) Anaphylaxis   Apple (diagnostic)    Other Reaction(s): Unknown   Fish Oil    Other Reaction(s): Unknown        Medication List     TAKE these medications    Abilify  Maintena 400  MG Prsy prefilled syringe Generic drug: ARIPiprazole  ER Inject 400 mg into the muscle every 28 (twenty-eight) days.   Accu-Chek Guide Test test strip Generic drug: glucose blood Use as instructed   Accu-Chek Guide w/Device Kit 1 Device by Does not apply route 4 (four) times daily.   Accu-Chek Softclix Lancets lancets Use as instructed   aspirin  EC 81 MG tablet Take 1 tablet (81 mg total) by mouth daily. Swallow whole.   cyclobenzaprine  5 MG tablet Commonly known as: FLEXERIL  Take 1 tablet (5 mg total) by mouth 3 (three) times daily as needed for muscle spasms.   famotidine  40 MG tablet Commonly known as: PEPCID  Take 1 tablet (40 mg total) by mouth daily.   hydrOXYzine  10 MG tablet Commonly known as: ATARAX  Take 1 tablet (10 mg total) by mouth 3 (three) times daily as needed.   lamoTRIgine  25 MG tablet Commonly known as: LAMICTAL  Take 2 tablets (50 mg total) by mouth daily.   ondansetron  8 MG disintegrating tablet Commonly known as: ZOFRAN -ODT Take 0.5 tablets (4 mg total) by mouth every 6 (six) hours as needed for nausea or vomiting.   Prenatal 28-0.8 MG Tabs Take 1 tablet by mouth daily.   QUEtiapine  50 MG tablet Commonly known as: SEROquel  Take 1 tablet (50 mg total) by mouth at bedtime.   sertraline  50 MG tablet Commonly known as: ZOLOFT  Take 3 tablets (150 mg total) by mouth at bedtime.   valACYclovir  500 MG tablet Commonly known as: VALTREX  Take 1 tablet (500 mg total) by mouth daily.        Olam Boards Certified Nurse-Midwife 09/15/2024 2:37 AM

## 2024-09-15 NOTE — Telephone Encounter (Signed)
 Patient is [redacted] weeks pregnant.  She was seen on 09/14/2024 for threatened preterm labor.  Patient was discharged and informed to come back into MAU if her contractions are 10 minutes apart or less, if she has a large gush of fluid, if bleeding occurred, or if she feels that her baby is not moving.  Patient informed clinical research associate that she was told that her baby was not spontaneously moving.  Provider request that patient follow back up with [facility] to discuss this.  Patient denies having contractions that are every 10 minutes or less apart.  She does note that she has mild pain when her baby moves.  Provider instructed her to call her OB/GYN or go back to MAU for further assessment if needed.  Patient notes that she missed her Abilify  LAI injection and notes that her medication was not at the pharmacy.  Provided resent Abilify  Maintena to preferred pharmacy.  Patient instructed to continue her medications as prescribed.  No other concerns noted at this time.

## 2024-09-15 NOTE — Discharge Instructions (Signed)
 Reasons to return to MAU at [facility]:  Since you are preterm, return to MAU if:  1.  Contractions are 10 minutes apart or less and they becoming more uncomfortable or painful over time 2.  You have a large gush of fluid, or a trickle of fluid that will not stop and you have to wear a pad 3.  You have bleeding that is bright red, heavier than spotting--like menstrual bleeding (spotting can be normal in early labor or after a check of your cervix) 4.  You do not feel the baby moving like he/she normally does

## 2024-09-18 LAB — URINE CULTURE

## 2024-09-19 ENCOUNTER — Encounter (HOSPITAL_COMMUNITY): Payer: Self-pay | Admitting: Obstetrics and Gynecology

## 2024-09-19 ENCOUNTER — Inpatient Hospital Stay (HOSPITAL_COMMUNITY)
Admission: AD | Admit: 2024-09-19 | Discharge: 2024-09-19 | Disposition: A | Discharge status: Home / Self Care | Attending: Obstetrics and Gynecology | Admitting: Obstetrics and Gynecology

## 2024-09-19 ENCOUNTER — Inpatient Hospital Stay (HOSPITAL_COMMUNITY)

## 2024-09-19 DIAGNOSIS — Z3A35 35 weeks gestation of pregnancy: Secondary | ICD-10-CM

## 2024-09-19 DIAGNOSIS — K5900 Constipation, unspecified: Secondary | ICD-10-CM | POA: Diagnosis not present

## 2024-09-19 DIAGNOSIS — N3000 Acute cystitis without hematuria: Secondary | ICD-10-CM

## 2024-09-19 DIAGNOSIS — O479 False labor, unspecified: Secondary | ICD-10-CM

## 2024-09-19 DIAGNOSIS — Z3689 Encounter for other specified antenatal screening: Secondary | ICD-10-CM

## 2024-09-19 DIAGNOSIS — O26893 Other specified pregnancy related conditions, third trimester: Secondary | ICD-10-CM

## 2024-09-19 LAB — URINALYSIS, ROUTINE W REFLEX MICROSCOPIC
Bilirubin Urine: NEGATIVE
Glucose, UA: NEGATIVE mg/dL
Hgb urine dipstick: NEGATIVE
Ketones, ur: NEGATIVE mg/dL
Nitrite: NEGATIVE
Protein, ur: NEGATIVE mg/dL
Specific Gravity, Urine: 1.012 (ref 1.005–1.030)
pH: 7 (ref 5.0–8.0)

## 2024-09-19 LAB — WET PREP, GENITAL
Clue Cells Wet Prep HPF POC: NONE SEEN
Sperm: NONE SEEN
Trich, Wet Prep: NONE SEEN
WBC, Wet Prep HPF POC: <10 (ref <10)
Yeast Wet Prep HPF POC: NONE SEEN

## 2024-09-19 MED ORDER — LACTATED RINGERS IV BOLUS
1000.0000 mL | Freq: Once | INTRAVENOUS | Status: AC
  Administered 2024-09-19: 1000 mL via INTRAVENOUS

## 2024-09-19 MED ORDER — ACETAMINOPHEN 500 MG PO TABS
1000.0000 mg | ORAL_TABLET | Freq: Once | ORAL | Status: AC
  Administered 2024-09-19: 1000 mg via ORAL
  Filled 2024-09-19: qty 2

## 2024-09-19 MED ORDER — NITROFURANTOIN MONOHYD MACRO 100 MG PO CAPS: 100.0000 mg | ORAL_CAPSULE | Freq: Two times a day (BID) | ORAL | 0 refills | Status: DC

## 2024-09-19 MED ORDER — SMOG ENEMA
960.0000 mL | Freq: Once | RECTAL | Status: AC
  Administered 2024-09-19: 960 mL via RECTAL
  Filled 2024-09-19: qty 960

## 2024-09-19 MED ORDER — POLYETHYLENE GLYCOL 3350 17 G PO PACK
17.0000 g | PACK | Freq: Every day | ORAL | Status: DC
  Administered 2024-09-19: 17 g via ORAL
  Filled 2024-09-19: qty 1

## 2024-09-19 NOTE — Discharge Instructions (Signed)
 Nicole [name],   It was a pleasure meeting you in MAU. Your baby looked great on our monitoring. It was determined you needed IV fluids. I am hopeful that your constipation will improve soon.    You do have a urinary tract infection, we are sending antibiotics to treat this. Please take all your medication until it is gone, even if you start to feel better.  Thank you for trusting us  to care for you, Nicole [name], Midwife

## 2024-09-19 NOTE — MAU Note (Signed)
 Nicole [name] is a 33 y.o. at [redacted]w[redacted]d here in MAU reporting: Came by EMS from home. Pt states since yesterday she noticed lower left abdominal cramping pt states it feels like ovary pain. Pt states the pain radiates to her back. No medication taken today for pain. Pt states she has felt movement with baby but not as much. No bleeding. Some mucous white discharge.   Pt states pain is 10/10  Vitals:   09/19/24 1522  BP: 107/74  Pulse: 94  Resp: 17  Temp: 98.3 F (36.8 C)  SpO2: 95%     FHT: 157 Lab orders paced from triage: UA

## 2024-09-19 NOTE — MAU Provider Note (Signed)
 Chief Complaint:  Abdominal Pain and Back Pain   HPI   None     Nicole [name] is a 33 y.o. G2P1001 at [redacted]w[redacted]d who presents to maternity admissions via EMS reporting LLQ abdominal pain and back pain. She reports that this started overnight. She denies regular contractions, stating they only happen sometimes. She reports constipation and cannot remember when she had her last bowel movement and reports having to strain with stool. She denies vaginal discomfort, discharge or discomfort with urination. She reports not feeling baby move as much as normal, however, when asked when she says when I'm sleeping .   Pregnancy Course: [facility]  Past Medical History:  Diagnosis Date   Anxiety    Depression    Diabetes mellitus without complication (HCC)    HSV-2 (herpes simplex virus 2) infection    Hypothyroidism 08/07/2022   Schizophrenia (HCC)    takes abilify    Tobacco use disorder 08/07/2022   OB History  Gravida Para Term Preterm AB Living  2 1 1  0 0 1  SAB IAB Ectopic Multiple Live Births  0 0 0 0 1    # Outcome Date GA Lbr Len/2nd Weight Sex Type Anes PTL Lv  2 Current           1 Term 03/25/18    F Vag-Spont   LIV   Past Surgical History:  Procedure Laterality Date   NO PAST SURGERIES     WISDOM TOOTH EXTRACTION Bilateral 2020   Family History  Problem Relation Age of Onset   Hypertension Father    Diabetes Father    Social History   Tobacco Use   Smoking status: Some Days    Current packs/day: 0.00    Average packs/day: 0.3 packs/day for 1 year (0.3 ttl pk-yrs)    Types: Cigarettes    Start date: 2021    Last attempt to quit: 02/17/2024    Years since quitting: 0.5   Smokeless tobacco: Current  Vaping Use   Vaping status: Former   Quit date: 01/13/2024   Substances: Flavoring  Substance Use Topics   Alcohol use: Not Currently    Comment: weekly   Drug use: Not Currently    Types: Marijuana, Other-see comments    Comment: Xanax   Allergies  Allergen  Reactions   Apple Juice Anaphylaxis and Other (See Comments)   Other Anaphylaxis and Other (See Comments)    SALMON   Watermelon Flavoring Agent (Non-Screening) Anaphylaxis   Apple (Diagnostic)     Other Reaction(s): Unknown   Fish Oil     Other Reaction(s): Unknown   No medications prior to admission.    I have reviewed patient's Past Medical Hx, Surgical Hx, Family Hx, Social Hx, medications and allergies.   ROS  Pertinent items noted in HPI and remainder of comprehensive ROS otherwise negative.   PHYSICAL EXAM  Patient Vitals for the past 24 hrs:  BP Temp Temp src Pulse Resp SpO2 Height Weight  09/19/24 1522 107/74 98.3 F (36.8 C) Oral 94 17 95 % 5' 1 (1.549 m) 91.2 kg    Physical Exam Vitals and nursing note reviewed.  Constitutional:      General: She is not in acute distress.    Appearance: Normal appearance. She is well-developed. She is not ill-appearing, toxic-appearing or diaphoretic.  HENT:     Head: Normocephalic.  Cardiovascular:     Rate and Rhythm: Normal rate.     Pulses: Normal pulses.  Pulmonary:  Effort: Pulmonary effort is normal.  Abdominal:     Palpations: Abdomen is soft.     Tenderness: There is no right CVA tenderness, left CVA tenderness, guarding or rebound. Negative signs include Murphy's sign, Rovsing's sign, McBurney's sign and psoas sign.     Comments: Gravid  Skin:    General: Skin is warm and dry.     Capillary Refill: Capillary refill takes less than 2 seconds.  Neurological:     General: No focal deficit present.     Mental Status: She is alert and oriented to person, place, and time.  Psychiatric:        Mood and Affect: Mood normal.        Behavior: Behavior normal.        Thought Content: Thought content normal.        Judgment: Judgment normal.      Dilation: 1 Effacement (%): 50 Cervical Position: Posterior Station: -3 Presentation: Vertex Exam by:: Camie, CNM  Fetal Tracing: Initially Cat 2 due to tachycardia  improved after IV fluid bolus Baseline: 145 Variability:moderate Accelerations: 15x15 Decelerations:absent Toco: UI, occ UC   Labs: Results for orders placed or performed during the hospital encounter of 09/19/24 (from the past 24 hours)  Urinalysis, Routine w reflex microscopic -Urine, Clean Catch     Status: Abnormal   Collection Time: 09/19/24  3:29 PM  Result Value Ref Range   Color, Urine YELLOW YELLOW   APPearance HAZY (A) CLEAR   Specific Gravity, Urine 1.012 1.005 - 1.030   pH 7.0 5.0 - 8.0   Glucose, UA NEGATIVE NEGATIVE mg/dL   Hgb urine dipstick NEGATIVE NEGATIVE   Bilirubin Urine NEGATIVE NEGATIVE   Ketones, ur NEGATIVE NEGATIVE mg/dL   Protein, ur NEGATIVE NEGATIVE mg/dL   Nitrite NEGATIVE NEGATIVE   Leukocytes,Ua TRACE (A) NEGATIVE   RBC / HPF 0-5 0 - 5 RBC/hpf   WBC, UA 0-5 0 - 5 WBC/hpf   Bacteria, UA RARE (A) NONE SEEN   Squamous Epithelial / HPF 6-10 0 - 5 /HPF   Mucus PRESENT   Wet prep, genital     Status: None   Collection Time: 09/19/24  5:35 PM   Specimen: PATH Cytology Cervicovaginal Ancillary Only  Result Value Ref Range   Yeast Wet Prep HPF POC NONE SEEN NONE SEEN   Trich, Wet Prep NONE SEEN NONE SEEN   Clue Cells Wet Prep HPF POC NONE SEEN NONE SEEN   WBC, Wet Prep HPF POC <10 <10   Sperm NONE SEEN     Imaging:  No results found.  MDM & MAU COURSE  MDM:  Moderate  MAU Course:  Patient with constipation, miralax  and enema administered. No stool resulted, however, patient reports some relief.  IV fluids administered due to fetal tachycardia, with good response.  BPP obtained due to report of DFM and difficulty understanding clicker. BPP 8/8, with reactive NST 10/10.  Tylenol  given for residual pain. UTI likely due to UA results.   Orders Placed This Encounter  Procedures   Wet prep, genital   Culture, OB Urine   US  MFM Fetal BPP Wo Non Stress   Urinalysis, Routine w reflex microscopic -Urine, Clean Catch   Insert peripheral IV    Discharge patient   Meds ordered this encounter  Medications   sorbitol , magnesium  hydroxide, mineral oil, glycerin (SMOG) enema   polyethylene glycol (MIRALAX  / GLYCOLAX ) packet 17 g   lactated ringers  bolus 1,000 mL   acetaminophen  (TYLENOL ) tablet 1,000  mg   nitrofurantoin , macrocrystal-monohydrate, (MACROBID ) 100 MG capsule    Sig: Take 1 capsule (100 mg total) by mouth 2 (two) times daily.    Dispense:  14 capsule    Refill:  0    Supervising Provider:   NICHOLAUS BURNARD HERO [[id number]]    ASSESSMENT   1. Constipation, unspecified constipation type   2. [redacted] weeks gestation of pregnancy   3. NST (non-stress test) reactive   4. Acute cystitis without hematuria   5. False labor     PLAN  Discharge home in stable condition.  Return precautions advised.  Macrobid  prescribed as below for acute cystitis.  Bus pass provided per patient request.  Follow up at [facility] as scheduled.   Allergies as of 09/19/2024       Reactions   Apple Juice Anaphylaxis, Other (See Comments)   Other Anaphylaxis, Other (See Comments)   SALMON   Watermelon Flavoring Agent (non-screening) Anaphylaxis   Apple (diagnostic)    Other Reaction(s): Unknown   Fish Oil    Other Reaction(s): Unknown        Medication List     TAKE these medications    Abilify  Maintena 400 MG Prsy prefilled syringe Generic drug: ARIPiprazole  ER Inject 400 mg into the muscle every 28 (twenty-eight) days.   Accu-Chek Guide Test test strip Generic drug: glucose blood Use as instructed   Accu-Chek Guide w/Device Kit 1 Device by Does not apply route 4 (four) times daily.   Accu-Chek Softclix Lancets lancets Use as instructed   aspirin  EC 81 MG tablet Take 1 tablet (81 mg total) by mouth daily. Swallow whole.   cyclobenzaprine  5 MG tablet Commonly known as: FLEXERIL  Take 1 tablet (5 mg total) by mouth 3 (three) times daily as needed for muscle spasms.   famotidine  40 MG tablet Commonly known as: PEPCID  Take 1  tablet (40 mg total) by mouth daily.   hydrOXYzine  10 MG tablet Commonly known as: ATARAX  Take 1 tablet (10 mg total) by mouth 3 (three) times daily as needed.   lamoTRIgine  25 MG tablet Commonly known as: LAMICTAL  Take 2 tablets (50 mg total) by mouth daily.   nitrofurantoin  (macrocrystal-monohydrate) 100 MG capsule Commonly known as: MACROBID  Take 1 capsule (100 mg total) by mouth 2 (two) times daily.   ondansetron  8 MG disintegrating tablet Commonly known as: ZOFRAN -ODT Take 0.5 tablets (4 mg total) by mouth every 6 (six) hours as needed for nausea or vomiting.   Prenatal 28-0.8 MG Tabs Take 1 tablet by mouth daily.   QUEtiapine  50 MG tablet Commonly known as: SEROquel  Take 1 tablet (50 mg total) by mouth at bedtime.   sertraline  50 MG tablet Commonly known as: ZOLOFT  Take 3 tablets (150 mg total) by mouth at bedtime.   valACYclovir  500 MG tablet Commonly known as: VALTREX  Take 1 tablet (500 mg total) by mouth daily.        Camie Rote, MSN, CNM 09/19/2024 8:00 PM  Certified Nurse Midwife, [facility]

## 2024-09-20 LAB — GC/CHLAMYDIA PROBE AMP (~~LOC~~) NOT AT ARMC
Chlamydia: NEGATIVE
Comment: NEGATIVE
Comment: NORMAL
Neisseria Gonorrhea: NEGATIVE

## 2024-09-21 ENCOUNTER — Encounter: Payer: Self-pay | Admitting: Obstetrics and Gynecology

## 2024-09-21 ENCOUNTER — Ambulatory Visit: Admitting: Obstetrics and Gynecology

## 2024-09-21 ENCOUNTER — Encounter: Admitting: Obstetrics and Gynecology

## 2024-09-21 VITALS — BP 115/78 | HR 98 | Wt 210.0 lb

## 2024-09-21 DIAGNOSIS — O24113 Pre-existing diabetes mellitus, type 2, in pregnancy, third trimester: Secondary | ICD-10-CM

## 2024-09-21 DIAGNOSIS — Z2911 Encounter for prophylactic immunotherapy for respiratory syncytial virus (RSV): Secondary | ICD-10-CM | POA: Diagnosis not present

## 2024-09-21 DIAGNOSIS — R8271 Bacteriuria: Secondary | ICD-10-CM

## 2024-09-21 DIAGNOSIS — I10 Essential (primary) hypertension: Secondary | ICD-10-CM | POA: Diagnosis not present

## 2024-09-21 DIAGNOSIS — F209 Schizophrenia, unspecified: Secondary | ICD-10-CM | POA: Diagnosis not present

## 2024-09-21 DIAGNOSIS — O0993 Supervision of high risk pregnancy, unspecified, third trimester: Secondary | ICD-10-CM | POA: Diagnosis not present

## 2024-09-21 DIAGNOSIS — O98213 Gonorrhea complicating pregnancy, third trimester: Secondary | ICD-10-CM

## 2024-09-21 DIAGNOSIS — Z6839 Body mass index (BMI) 39.0-39.9, adult: Secondary | ICD-10-CM

## 2024-09-21 DIAGNOSIS — Z3A36 36 weeks gestation of pregnancy: Secondary | ICD-10-CM | POA: Diagnosis not present

## 2024-09-21 DIAGNOSIS — B009 Herpesviral infection, unspecified: Secondary | ICD-10-CM | POA: Diagnosis not present

## 2024-09-21 DIAGNOSIS — Z23 Encounter for immunization: Secondary | ICD-10-CM

## 2024-09-21 DIAGNOSIS — E039 Hypothyroidism, unspecified: Secondary | ICD-10-CM | POA: Diagnosis not present

## 2024-09-21 DIAGNOSIS — O0990 Supervision of high risk pregnancy, unspecified, unspecified trimester: Secondary | ICD-10-CM

## 2024-09-21 DIAGNOSIS — F17200 Nicotine dependence, unspecified, uncomplicated: Secondary | ICD-10-CM

## 2024-09-21 DIAGNOSIS — O24112 Pre-existing diabetes mellitus, type 2, in pregnancy, second trimester: Secondary | ICD-10-CM

## 2024-09-21 LAB — CULTURE, OB URINE: Culture: 20000 — AB

## 2024-09-21 NOTE — Progress Notes (Signed)
   PRENATAL VISIT NOTE  Subjective:  Nicole [name] is a 33 y.o. G2P1001 at [redacted]w[redacted]d being seen today for ongoing prenatal care.  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  Patient reports no complaints.  Contractions: Irregular. Vag. Bleeding: None.  Movement: Present. Denies leaking of fluid.   The following portions of the patient's history were reviewed and updated as appropriate: allergies, current medications, past family history, past medical history, past social history, past surgical history and problem list.   Objective:   Vitals:   09/21/24 1112  BP: 115/78  Pulse: 98  Weight: 210 lb (95.3 kg)    Fetal Status: Fetal Heart Rate (bpm): 160   Movement: Present     General:  Alert, oriented and cooperative. Patient is in no acute distress.  Skin: Skin is warm and dry. No rash noted.   Cardiovascular: Normal heart rate noted  Respiratory: Normal respiratory effort, no problems with respiration noted  Abdomen: Soft, gravid, appropriate for gestational age.  Pain/Pressure: Present      Assessment and Plan:  Pregnancy: G2P1001 at [redacted]w[redacted]d 1. Supervision of high risk pregnancy, antepartum (Primary) 2. [redacted] weeks gestation of pregnancy GBS+, GC/CT neg in MAU 2 days ago RSV vaccine today  3. Essential hypertension Normotensive, no antihypertensives Normal baseline  serum labs, no early PC ratio ldASA NST today reactive & reassuring, continue weekly antenatal testing AGA @ [redacted]w[redacted]d, next growth scheduled 12/15 IOL scheduled for 12/30 (39w) but discussed we will move up if issues with BP, BG or fetal growth  4. Type 2 diabetes mellitus affecting pregnancy in second trimester, antepartum Early A1c 5.4 Does not have log today, but will send picture via mychart. Reports fasting <90 and most postprandial at goal No current medications Antenatal testing as above  5. Schizophrenia, unspecified type (HCC) Continue abilify  maintena, lamictal , seroquel  & zoloft  Overdue for abilify  injection - reviewed importance of getting it done ASAP, esp w/ risk of peripartum depression/psychoasis  6. Hypothyroidism, unspecified type Normal TSH 10/27, will repeat today - TSH  7. Tobacco use disorder 7cpd, now down to 1cpd Congratulated and encouraged further efforts at cessation  8. Maternal gonorrhea in third trimester Negative 12/7  9. GBS bacteriuria PCN in labor  10. HSV-2 (herpes simplex virus 2) infection Continue valtrex   Please refer to After Visit Summary for other counseling recommendations.   Return in about 1 week (around 09/28/2024) for return OB at 37 weeks.  Future Appointments  Date Time Provider Department Center  09/27/2024 10:15 AM [facility] PROVIDER 1 [facility]  09/27/2024 10:30 AM [facility] US2 [facility]  09/28/2024 10:00 AM Arlinda Buster, MD [facility] None  10/05/2024  9:15 AM Erik Kieth BROCKS, MD [facility] None  11/10/2024  8:00 AM Harl Zane BRAVO, NP [facility] None   Kieth BROCKS Erik, MD

## 2024-09-23 ENCOUNTER — Other Ambulatory Visit: Payer: Self-pay | Admitting: Family Medicine

## 2024-09-23 ENCOUNTER — Encounter: Payer: Self-pay | Admitting: Family Medicine

## 2024-09-23 DIAGNOSIS — N3000 Acute cystitis without hematuria: Secondary | ICD-10-CM

## 2024-09-23 DIAGNOSIS — R8271 Bacteriuria: Secondary | ICD-10-CM

## 2024-09-24 ENCOUNTER — Inpatient Hospital Stay (HOSPITAL_COMMUNITY)

## 2024-09-24 ENCOUNTER — Inpatient Hospital Stay (HOSPITAL_COMMUNITY)
Admission: AD | Admit: 2024-09-24 | Discharge: 2024-09-25 | Disposition: A | Attending: Obstetrics & Gynecology | Admitting: Obstetrics & Gynecology

## 2024-09-24 ENCOUNTER — Inpatient Hospital Stay (HOSPITAL_COMMUNITY)
Admission: AD | Admit: 2024-09-24 | Discharge: 2024-09-24 | Disposition: A | Discharge status: Home / Self Care | Attending: Obstetrics and Gynecology | Admitting: Obstetrics and Gynecology

## 2024-09-24 ENCOUNTER — Encounter (HOSPITAL_COMMUNITY): Payer: Self-pay | Admitting: Obstetrics and Gynecology

## 2024-09-24 ENCOUNTER — Other Ambulatory Visit: Payer: Self-pay

## 2024-09-24 ENCOUNTER — Encounter (HOSPITAL_COMMUNITY): Payer: Self-pay | Admitting: Obstetrics & Gynecology

## 2024-09-24 DIAGNOSIS — Z3A36 36 weeks gestation of pregnancy: Secondary | ICD-10-CM

## 2024-09-24 DIAGNOSIS — F1990 Other psychoactive substance use, unspecified, uncomplicated: Secondary | ICD-10-CM

## 2024-09-24 DIAGNOSIS — O24113 Pre-existing diabetes mellitus, type 2, in pregnancy, third trimester: Secondary | ICD-10-CM

## 2024-09-24 DIAGNOSIS — O99343 Other mental disorders complicating pregnancy, third trimester: Secondary | ICD-10-CM | POA: Diagnosis not present

## 2024-09-24 DIAGNOSIS — O99333 Smoking (tobacco) complicating pregnancy, third trimester: Secondary | ICD-10-CM | POA: Diagnosis not present

## 2024-09-24 DIAGNOSIS — O289 Unspecified abnormal findings on antenatal screening of mother: Secondary | ICD-10-CM | POA: Diagnosis not present

## 2024-09-24 DIAGNOSIS — O4703 False labor before 37 completed weeks of gestation, third trimester: Secondary | ICD-10-CM | POA: Diagnosis not present

## 2024-09-24 DIAGNOSIS — R8271 Bacteriuria: Secondary | ICD-10-CM

## 2024-09-24 DIAGNOSIS — O09293 Supervision of pregnancy with other poor reproductive or obstetric history, third trimester: Secondary | ICD-10-CM | POA: Diagnosis not present

## 2024-09-24 DIAGNOSIS — O479 False labor, unspecified: Secondary | ICD-10-CM

## 2024-09-24 DIAGNOSIS — E039 Hypothyroidism, unspecified: Secondary | ICD-10-CM | POA: Diagnosis not present

## 2024-09-24 DIAGNOSIS — F319 Bipolar disorder, unspecified: Secondary | ICD-10-CM | POA: Diagnosis not present

## 2024-09-24 DIAGNOSIS — O99213 Obesity complicating pregnancy, third trimester: Secondary | ICD-10-CM | POA: Diagnosis not present

## 2024-09-24 DIAGNOSIS — O99283 Endocrine, nutritional and metabolic diseases complicating pregnancy, third trimester: Secondary | ICD-10-CM | POA: Diagnosis not present

## 2024-09-24 DIAGNOSIS — O98213 Gonorrhea complicating pregnancy, third trimester: Secondary | ICD-10-CM

## 2024-09-24 DIAGNOSIS — O99323 Drug use complicating pregnancy, third trimester: Secondary | ICD-10-CM | POA: Diagnosis not present

## 2024-09-24 DIAGNOSIS — O0990 Supervision of high risk pregnancy, unspecified, unspecified trimester: Secondary | ICD-10-CM

## 2024-09-24 DIAGNOSIS — Z3689 Encounter for other specified antenatal screening: Secondary | ICD-10-CM

## 2024-09-24 DIAGNOSIS — O09893 Supervision of other high risk pregnancies, third trimester: Secondary | ICD-10-CM | POA: Diagnosis not present

## 2024-09-24 LAB — URINALYSIS, ROUTINE W REFLEX MICROSCOPIC
Bilirubin Urine: NEGATIVE
Glucose, UA: NEGATIVE mg/dL
Hgb urine dipstick: NEGATIVE
Ketones, ur: NEGATIVE mg/dL
Leukocytes,Ua: NEGATIVE
Nitrite: NEGATIVE
Protein, ur: NEGATIVE mg/dL
Specific Gravity, Urine: 1.005 (ref 1.005–1.030)
pH: 7 (ref 5.0–8.0)

## 2024-09-24 NOTE — MAU Provider Note (Signed)
 S: Ms. AMARIYANA HEACOX is a 33 y.o. G2P1001 at [redacted]w[redacted]d  who presents to MAU today complaining contractions q 4 minutes since this morning. She denies vaginal bleeding. She denies LOF. She reports normal fetal movement.  Reports good fetal movement.    O: BP 110/72   Pulse 82   Temp 97.9 F (36.6 C) (Oral)   Resp 16   LMP 01/13/2024 (Exact Date)   SpO2 100%  GENERAL: Well-developed, well-nourished female in no acute distress.  HEAD: Normocephalic, atraumatic.  CHEST: Normal effort of breathing, regular heart rate ABDOMEN: Soft, nontender, gravid  Cervical exam:  Dilation: 1 Effacement (%): 50 Cervical Position: Posterior Station: -3 Exam by:: Nat Sharps RN  Recheck was normal.   Fetal Monitoring: Baseline: 145 Variability: Moderate Accelerations: 15x15 Decelerations: 30 sec variable deceleration noted with minimal variability at times. Sent for BPP Contractions: Irregular pattern.   BPP 8/8   A: SIUP at [redacted]w[redacted]d  False labor 8/8 BPP, reactive NST   P: Dc home Labor precautions.  Dorita Delon FERNS, NP 09/24/2024 12:41 PM

## 2024-09-24 NOTE — MAU Note (Signed)
 Pt has arrived via EMS- says she was here earlier- VE- 1 cm  Says UC's are stronger . Has Hx - HSV- denies an outbreak during this preg- Takes Valtrex  daily . GBS- positive

## 2024-09-24 NOTE — MAU Note (Signed)
 Nicole [name] is a 33 y.o. at [redacted]w[redacted]d here in MAU reporting: arrived via ems coming from home c/o ctxs that are every 4 minutes. Denies any LOF or VB. Feeling pelvic pressure feels worse when bladder is full. Reports +FM  Could not tell me why she is on abx currently.  18G LAC placed on EMS truck. Vitals- 108/82  HR 92  CBG 94  Onset of complaint: this am  Pain score: 9 Vitals:   09/24/24 1027  BP: 107/75  Pulse: 88  Resp: 16  Temp: 97.9 F (36.6 C)  SpO2: 98%     FHT:152 Lab orders placed from triage:  labor eval

## 2024-09-25 ENCOUNTER — Inpatient Hospital Stay (HOSPITAL_COMMUNITY)
Admission: AD | Admit: 2024-09-25 | Discharge: 2024-09-25 | Disposition: A | Discharge status: Home / Self Care | Attending: Obstetrics and Gynecology | Admitting: Obstetrics and Gynecology

## 2024-09-25 ENCOUNTER — Other Ambulatory Visit: Payer: Self-pay

## 2024-09-25 DIAGNOSIS — Z3A36 36 weeks gestation of pregnancy: Secondary | ICD-10-CM

## 2024-09-25 DIAGNOSIS — Z3689 Encounter for other specified antenatal screening: Secondary | ICD-10-CM

## 2024-09-25 DIAGNOSIS — O4703 False labor before 37 completed weeks of gestation, third trimester: Secondary | ICD-10-CM

## 2024-09-25 DIAGNOSIS — O368130 Decreased fetal movements, third trimester, not applicable or unspecified: Secondary | ICD-10-CM

## 2024-09-25 MED ORDER — ACETAMINOPHEN 325 MG PO TABS
650.0000 mg | ORAL_TABLET | Freq: Once | ORAL | Status: AC
  Administered 2024-09-25: 650 mg via ORAL
  Filled 2024-09-25: qty 2

## 2024-09-25 MED ORDER — CYCLOBENZAPRINE HCL 5 MG PO TABS
10.0000 mg | ORAL_TABLET | Freq: Once | ORAL | Status: AC
  Administered 2024-09-25: 10 mg via ORAL
  Filled 2024-09-25: qty 2

## 2024-09-25 NOTE — MAU Note (Cosign Needed)
 Maternal Assessment Unit Provider Note  Subjective: [name] is a 33 y.o. G2P1001 pregnant female at [redacted]w[redacted]d who presents to MAU today with complaint of contractions.   Made minimal cervical change after an hour of observation.   Objective: BP 98/68   Pulse 86   Temp 98.4 F (36.9 C) (Oral)   Resp 12   Ht 5' 1 (1.549 m)   Wt 95.3 kg   LMP 01/13/2024 (Exact Date)   SpO2 95%   BMI 39.70 kg/m   MDM: low risk  MAU Course:  Time: 12:44 AM   FHT: baseline 145 bpm, moderate variability, + accelerations, no decelerations,   Contractions: q 4 mins,      Questions were answered to the satisfaction of the patient and/or family prior to discharge.   Assessment Labor check only    ICD-10-CM   1. Supervision of high risk pregnancy, antepartum  O09.90     2. Maternal gonorrhea in third trimester  O98.213     3. GBS bacteriuria  R82.71     4. False labor  O47.9        Plan Discharge from MAU in stable condition with strict return/labor precautions  Allergies as of 09/25/2024       Reactions   Apple Juice Anaphylaxis, Other (See Comments)   Other Anaphylaxis, Other (See Comments)   SALMON   Watermelon Flavoring Agent (non-screening) Anaphylaxis   Apple (diagnostic)    Other Reaction(s): Unknown   Fish Oil    Other Reaction(s): Unknown        Medication List     TAKE these medications    Abilify  Maintena 400 MG Prsy prefilled syringe Generic drug: ARIPiprazole  ER Inject 400 mg into the muscle every 28 (twenty-eight) days.   Accu-Chek Guide Test test strip Generic drug: glucose blood Use as instructed   Accu-Chek Guide w/Device Kit 1 Device by Does not apply route 4 (four) times daily.   Accu-Chek Softclix Lancets lancets Use as instructed   amoxicillin 500 MG capsule Commonly known as: AMOXIL Take 1 capsule (500 mg total) by mouth 3 (three) times daily for 7 days.   aspirin  EC 81 MG tablet Take 1 tablet (81 mg total) by mouth  daily. Swallow whole.   cyclobenzaprine  5 MG tablet Commonly known as: FLEXERIL  Take 1 tablet (5 mg total) by mouth 3 (three) times daily as needed for muscle spasms.   famotidine  40 MG tablet Commonly known as: PEPCID  Take 1 tablet (40 mg total) by mouth daily.   hydrOXYzine  10 MG tablet Commonly known as: ATARAX  Take 1 tablet (10 mg total) by mouth 3 (three) times daily as needed.   lamoTRIgine  25 MG tablet Commonly known as: LAMICTAL  Take 2 tablets (50 mg total) by mouth daily.   ondansetron  8 MG disintegrating tablet Commonly known as: ZOFRAN -ODT Take 0.5 tablets (4 mg total) by mouth every 6 (six) hours as needed for nausea or vomiting.   Prenatal 28-0.8 MG Tabs Take 1 tablet by mouth daily.   QUEtiapine  50 MG tablet Commonly known as: SEROquel  Take 1 tablet (50 mg total) by mouth at bedtime.   sertraline  50 MG tablet Commonly known as: ZOLOFT  Take 3 tablets (150 mg total) by mouth at bedtime.   valACYclovir  500 MG tablet Commonly known as: VALTREX  Take 1 tablet (500 mg total) by mouth daily.        Trudy Leeroy NOVAK, MD 09/25/2024 12:44 AM

## 2024-09-25 NOTE — MAU Provider Note (Signed)
 History     CSN: [id number]  Arrival date and time: 09/25/24 2103   Event Date/Time   First Provider Initiated Contact with Patient 09/25/24 2158      Chief Complaint  Patient presents with   Contractions   Nicole [name] is a 33 y.o. G2P1001 at [redacted]w[redacted]d who receives care at [facility].  She presents today for CTX.  However, during evaluation patient with c/o DFM.  She states she has not felt baby move despite walking and relaxing. She reports she does not recall the last time she felt movement prior to arrival.  She states contractions have been ongoing for 4 days.  She denies vaginal concerns, but reports some mucous.  She denies issues with urination, constipation, or diarrhea, but reports increased bowel movements.    OB History     Gravida  2   Para  1   Term  1   Preterm  0   AB  0   Living  1      SAB  0   IAB  0   Ectopic  0   Multiple  0   Live Births  1           Past Medical History:  Diagnosis Date   Anxiety    Depression    Diabetes mellitus without complication (HCC)    HSV-2 (herpes simplex virus 2) infection    Hypothyroidism 08/07/2022   Schizophrenia (HCC)    takes abilify    Tobacco use disorder 08/07/2022    Past Surgical History:  Procedure Laterality Date   NO PAST SURGERIES     WISDOM TOOTH EXTRACTION Bilateral 2020    Family History  Problem Relation Age of Onset   Hypertension Father    Diabetes Father     Social History[1]  Allergies: Allergies[2]  Facility-Administered Medications Prior to Admission  Medication Dose Route Frequency Provider Last Rate Last Admin   ARIPiprazole  ER (ABILIFY  MAINTENA) 400 MG prefilled syringe 400 mg  400 mg Intramuscular Q28 days    400 mg at 08/11/24 1345   Medications Prior to Admission  Medication Sig Dispense Refill Last Dose/Taking   Accu-Chek Softclix Lancets lancets Use as instructed 100 each 12 09/25/2024   amoxicillin (AMOXIL) 500 MG capsule Take 1  capsule (500 mg total) by mouth 3 (three) times daily for 7 days. 21 capsule 0 09/25/2024   ARIPiprazole  ER (ABILIFY  MAINTENA) 400 MG PRSY prefilled syringe Inject 400 mg into the muscle every 28 (twenty-eight) days. 3 each 11 Past Week   aspirin  EC 81 MG tablet Take 1 tablet (81 mg total) by mouth daily. Swallow whole. 90 tablet 5 09/24/2024   Blood Glucose Monitoring Suppl (ACCU-CHEK GUIDE) w/Device KIT 1 Device by Does not apply route 4 (four) times daily. 1 kit 0 09/25/2024   glucose blood (ACCU-CHEK GUIDE TEST) test strip Use as instructed 100 each 12 09/25/2024   lamoTRIgine  (LAMICTAL ) 25 MG tablet Take 2 tablets (50 mg total) by mouth daily. 60 tablet 3 09/24/2024   Prenatal 28-0.8 MG TABS Take 1 tablet by mouth daily. 90 tablet 12 09/24/2024   QUEtiapine  (SEROQUEL ) 50 MG tablet Take 1 tablet (50 mg total) by mouth at bedtime. 30 tablet 3 09/24/2024   sertraline  (ZOLOFT ) 50 MG tablet Take 3 tablets (150 mg total) by mouth at bedtime. 90 tablet 3 09/24/2024   valACYclovir  (VALTREX ) 500 MG tablet Take 1 tablet (500 mg total) by mouth daily. 30 tablet 8 09/24/2024   cyclobenzaprine  (FLEXERIL ) 5  MG tablet Take 1 tablet (5 mg total) by mouth 3 (three) times daily as needed for muscle spasms. 20 tablet 0 Unknown   famotidine  (PEPCID ) 40 MG tablet Take 1 tablet (40 mg total) by mouth daily. (Patient not taking: No sig reported) 90 tablet 3 Unknown   hydrOXYzine  (ATARAX ) 10 MG tablet Take 1 tablet (10 mg total) by mouth 3 (three) times daily as needed. 90 tablet 3    ondansetron  (ZOFRAN -ODT) 8 MG disintegrating tablet Take 0.5 tablets (4 mg total) by mouth every 6 (six) hours as needed for nausea or vomiting. 20 tablet 0 Unknown    Review of Systems  Gastrointestinal:  Positive for abdominal pain (Contractions). Negative for constipation, diarrhea, nausea and vomiting.  Genitourinary:  Negative for difficulty urinating, dysuria, vaginal bleeding and vaginal discharge.   Physical Exam    Blood pressure 104/68, pulse 85, last menstrual period 01/13/2024.  Physical Exam  Fetal Assessment *** bpm, Mod Var, -Decels, +Accels Toco: Irregular  MAU Course  No results found for this or any previous visit (from the past 24 hours). No results found.   MDM PE Labs: EFM  Assessment and Plan   33 year old G2P1001  SIUP at 36.4 weeks Cat I FT Contractions DFM  -Patient encouraged to hit clicker to mark fetal movement.  -Patient reports contraction pain of 9/10 and pain medication offered.  -Patient agreeable.  -Will give flexeril  and tylenol . -Patient agreeable and without questions. -Will recheck cervix and plan for discharge if no change.  -NST reactive  Harlene LITTIE Duncans MSN, CNM 09/25/2024, 9:58 PM   Reassessment (10:52 PM) -Cervix remains unchanged. -NST reactive. -Fetal movement appreciated. -Nurse to give precautions. -Patient to follow up as scheduled. -Discharged to home in stable condition.  Harlene LITTIE Duncans MSN, CNM Advanced Practice Provider, [facility]    Reassessment (10:52 PM)       [1] Social History Tobacco Use   Smoking status: Some Days    Current packs/day: 0.00    Average packs/day: 0.3 packs/day for 1 year (0.3 ttl pk-yrs)    Types: Cigarettes    Start date: 2021    Last attempt to quit: 02/17/2024    Years since quitting: 0.6   Smokeless tobacco: Current  Vaping Use   Vaping status: Former   Quit date: 01/13/2024   Substances: Flavoring  Substance Use Topics   Alcohol use: Not Currently    Comment: weekly   Drug use: Not Currently    Types: Marijuana, Other-see comments    Comment: Xanax  [2] Allergies Allergen Reactions   Apple Juice Anaphylaxis and Other (See Comments)   Other Anaphylaxis and Other (See Comments)    SALMON   Watermelon Flavoring Agent (Non-Screening) Anaphylaxis   Apple (Diagnostic)     Other Reaction(s): Unknown   Fish Oil     Other Reaction(s): Unknown

## 2024-09-25 NOTE — MAU Note (Signed)
 Nicole [name] is a 33 y.o. at [redacted]w[redacted]d here in MAU reporting: CTX that have continued for the last few days. Pt reports not feeling baby move as much today but no c/o of LOF or VB   LMP:  Onset of complaint: few days ago Pain score: 8/10  FHT: pt placed straight in MAU exam room form EMS   Lab orders placed from triage:

## 2024-09-25 NOTE — MAU Note (Signed)
 09/25/2024  Nicole [name] DOB: [birth year] MRN: [id number]   RIDER WAIVER AND RELEASE OF LIABILITY  For the purposes of helping with transportation needs, [facility] partners with outside transportation providers (taxi companies, [place], catering manager.) to give [facility] patients or other approved people the choice of on-demand rides Public Librarian) to our buildings for non-emergency visits.  By using [employer], I, the person signing this document, on behalf of myself and/or any legal minors (in my care using the [employer]), agree:  Science Writer given to me are supplied by independent, outside transportation providers who do not work for, or have any affiliation with, [employer]. [employer] is not a transportation company. [facility] has no control over the quality or safety of the rides I get using [employer]. [place] has no control over whether any outside ride will happen on time or not. [facility] gives no guarantee on the reliability, quality, safety, or availability on any rides, or that no mistakes will happen. I know and accept that traveling by vehicle (car, truck, SVU, fleeta, bus, taxi, etc.) has risks of serious injuries such as disability, being paralyzed, and death. I know and agree the risk of using [employer] is mine alone, and not [employer]. [employer] are provided as is and as are available. The transportation providers are in charge for all inspections and care of the vehicles used to provide these rides. I agree not to take legal action against [facility], its agents, employees, officers, directors, representatives, insurers, attorneys, assigns, successors, subsidiaries, and affiliates at any time for any reasons related directly or indirectly to using [employer]. I also agree not to take legal action against [facility] or its affiliates for any injury, death, or damage to property caused by or related to using  [employer]. I have read this Waiver and Release of Liability, and I understand the terms used in it and their legal meaning. This Waiver is freely and voluntarily given with the understanding that my right (or any legal minors) to legal action against [place] [facility] relating to [employer] is knowingly given up to use these services.   I attest that I read the Ride Waiver and Release of Liability to Nicole [name], gave Ms. Borawski the opportunity to ask questions and answered the questions asked (if any). I affirm that Nicole [name] then provided consent for assistance with transportation.

## 2024-09-26 ENCOUNTER — Other Ambulatory Visit: Payer: Self-pay

## 2024-09-26 ENCOUNTER — Encounter (HOSPITAL_COMMUNITY): Payer: Self-pay | Admitting: Obstetrics & Gynecology

## 2024-09-26 DIAGNOSIS — Z3A36 36 weeks gestation of pregnancy: Secondary | ICD-10-CM

## 2024-09-26 DIAGNOSIS — O368330 Maternal care for abnormalities of the fetal heart rate or rhythm, third trimester, not applicable or unspecified: Secondary | ICD-10-CM | POA: Diagnosis present

## 2024-09-26 DIAGNOSIS — O4703 False labor before 37 completed weeks of gestation, third trimester: Secondary | ICD-10-CM | POA: Diagnosis not present

## 2024-09-26 DIAGNOSIS — O479 False labor, unspecified: Secondary | ICD-10-CM

## 2024-09-26 NOTE — MAU Note (Signed)
 Nicole [name] is a 33 y.o. at [redacted]w[redacted]d here in MAU reporting: Ucs x5days, worsening today around noon. Reports normal FM, denies bleeding/LOF.  LMP: 01/21/24 Onset of complaint: 1200 Pain score: 9 Vitals:   09/26/24 1815 09/26/24 1816  BP: 129/78   Pulse: 87   Resp: 20   Temp: 98.2 F (36.8 C)   SpO2: 97% 99%     FHT: 150  Lab orders placed from triage:

## 2024-09-26 NOTE — MAU Provider Note (Signed)
 S: Patient is here for RN labor evaluation. Fetal tracing, vital signs, & chart reviewed   O:  Vitals:   09/26/24 1815 09/26/24 1816  BP: 129/78   Pulse: 87   Resp: 20   Temp: 98.2 F (36.8 C)   TempSrc: Oral   SpO2: 97% 99%   No results found for this or any previous visit (from the past 24 hours).  Dilation: 2.5 Effacement (%): 50 Cervical Position: Posterior Station: Ballotable Presentation: Vertex Exam by:: Ariel B. RN  Cervix Unchanged after 2 hours.    FHR: 140 bpm, Mod Var, variable Decels, positive Accels UC: None   A: 1. False labor   2. [redacted] weeks gestation of pregnancy      P:  RN to discharge home in stable condition with return precautions & fetal kick counts  Delon Emms, NP 09/26/2024 8:45 PM

## 2024-09-27 ENCOUNTER — Ambulatory Visit

## 2024-09-27 ENCOUNTER — Ambulatory Visit: Attending: Maternal & Fetal Medicine

## 2024-09-27 VITALS — BP 115/76 | HR 88

## 2024-09-27 DIAGNOSIS — Z3A36 36 weeks gestation of pregnancy: Secondary | ICD-10-CM | POA: Diagnosis present

## 2024-09-27 DIAGNOSIS — O24112 Pre-existing diabetes mellitus, type 2, in pregnancy, second trimester: Secondary | ICD-10-CM

## 2024-09-27 DIAGNOSIS — O99211 Obesity complicating pregnancy, first trimester: Secondary | ICD-10-CM | POA: Diagnosis not present

## 2024-09-27 DIAGNOSIS — O09291 Supervision of pregnancy with other poor reproductive or obstetric history, first trimester: Secondary | ICD-10-CM | POA: Diagnosis not present

## 2024-09-27 DIAGNOSIS — O09891 Supervision of other high risk pregnancies, first trimester: Secondary | ICD-10-CM | POA: Diagnosis not present

## 2024-09-27 DIAGNOSIS — O289 Unspecified abnormal findings on antenatal screening of mother: Secondary | ICD-10-CM | POA: Diagnosis not present

## 2024-09-27 DIAGNOSIS — E119 Type 2 diabetes mellitus without complications: Secondary | ICD-10-CM | POA: Diagnosis not present

## 2024-09-27 DIAGNOSIS — O99210 Obesity complicating pregnancy, unspecified trimester: Secondary | ICD-10-CM

## 2024-09-27 DIAGNOSIS — O99321 Drug use complicating pregnancy, first trimester: Secondary | ICD-10-CM | POA: Diagnosis not present

## 2024-09-27 DIAGNOSIS — O99331 Smoking (tobacco) complicating pregnancy, first trimester: Secondary | ICD-10-CM | POA: Diagnosis not present

## 2024-09-27 DIAGNOSIS — F319 Bipolar disorder, unspecified: Secondary | ICD-10-CM | POA: Diagnosis not present

## 2024-09-27 DIAGNOSIS — O99323 Drug use complicating pregnancy, third trimester: Secondary | ICD-10-CM | POA: Diagnosis not present

## 2024-09-27 DIAGNOSIS — O99281 Endocrine, nutritional and metabolic diseases complicating pregnancy, first trimester: Secondary | ICD-10-CM | POA: Diagnosis not present

## 2024-09-27 DIAGNOSIS — I10 Essential (primary) hypertension: Secondary | ICD-10-CM

## 2024-09-27 DIAGNOSIS — O10912 Unspecified pre-existing hypertension complicating pregnancy, second trimester: Secondary | ICD-10-CM

## 2024-09-27 DIAGNOSIS — O4700 False labor before 37 completed weeks of gestation, unspecified trimester: Secondary | ICD-10-CM | POA: Diagnosis not present

## 2024-09-27 DIAGNOSIS — O24113 Pre-existing diabetes mellitus, type 2, in pregnancy, third trimester: Secondary | ICD-10-CM | POA: Diagnosis not present

## 2024-09-27 DIAGNOSIS — O99341 Other mental disorders complicating pregnancy, first trimester: Secondary | ICD-10-CM | POA: Diagnosis not present

## 2024-09-27 DIAGNOSIS — O0990 Supervision of high risk pregnancy, unspecified, unspecified trimester: Secondary | ICD-10-CM

## 2024-09-27 DIAGNOSIS — F1990 Other psychoactive substance use, unspecified, uncomplicated: Secondary | ICD-10-CM | POA: Diagnosis not present

## 2024-09-27 NOTE — Progress Notes (Signed)
 Patient information  Patient Name: Nicole [name]  Patient MRN:   [id number]  Referring practice: MFM Referring Provider: [facility]  Problem List   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 G2P1001 at [redacted]w[redacted]d here for ultrasound and consultation. Nicole [name] is doing well today with no acute concerns.   This pregnancy is complicated by well-controlled type 2 diabetes mellitus managed without medication. Glycemic control has been adequate, and there is no evidence of fetal overgrowth or other diabetes-related complications at this time. The patient is currently 2.5 cm dilated and has expressed interest in a membrane sweep. In the setting of well-controlled pregestational diabetes, planned delivery at 39 weeks is appropriate. Ongoing fetal surveillance is planned, and the patient reports normal fetal movement.  Recommendations: -Continue dietary management and blood glucose monitoring as currently prescribed -Perform NST next week as scheduled -Plan delivery at 39 weeks if maternal and fetal status remain reassuring -Offer membrane sweep at  next OB visit  -Standard OB precautions were given to the patient. The patient had time to ask questions that were answered to her satisfaction.  She verbalized understanding and agrees to proceed with the plan above.   I spent 30 minutes reviewing the patients chart, including labs and images as well as counseling the patient about her medical conditions. Greater than 50% of the time was spent in direct face-to-face patient counseling.  Nicole [name]  MFM, [place]   09/27/2024  11:15 AM   Review of Systems: A review of systems was performed and was negative except per HPI   Vitals and Physical Exam    09/27/2024   10:19 AM 09/26/2024    6:15 PM 09/25/2024   10:45 PM  Vitals with BMI  Systolic 115 129 895  Diastolic 76 78 68  Pulse 88 87 85    Sitting comfortably on the sonogram table Nonlabored breathing Normal rate and rhythm Abdomen is nontender  Past pregnancies OB History  Gravida Para Term Preterm AB Living  2 1 1  0 0 1  SAB IAB Ectopic Multiple Live Births  0 0 0 0 1    # Outcome Date GA Lbr Len/2nd Weight Sex Type Anes PTL Lv  2 Current           1 Term 03/25/18   6 lb 3 oz (2.807 kg) F Vag-Spont   LIV     Future Appointments  Date Time Provider Department Center  09/28/2024 10:00 AM Arlinda Buster, MD [facility] None  10/05/2024  9:15 AM Erik Kieth BROCKS, MD [facility] None  10/12/2024  6:45 AM [facility] SCHED ROOM [facility] None  11/10/2024  8:00 AM Harl Zane BRAVO, NP [facility] None

## 2024-09-28 ENCOUNTER — Telehealth (HOSPITAL_COMMUNITY): Payer: Self-pay | Admitting: *Deleted

## 2024-09-28 ENCOUNTER — Ambulatory Visit: Admitting: Orthopedic Surgery

## 2024-09-28 NOTE — Telephone Encounter (Signed)
 Preadmission screen
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
## 2024-09-29 ENCOUNTER — Encounter (HOSPITAL_COMMUNITY): Payer: Self-pay | Admitting: Obstetrics and Gynecology

## 2024-09-29 ENCOUNTER — Other Ambulatory Visit: Payer: Self-pay

## 2024-09-29 ENCOUNTER — Encounter (HOSPITAL_COMMUNITY): Payer: Self-pay | Admitting: *Deleted

## 2024-09-29 ENCOUNTER — Inpatient Hospital Stay (HOSPITAL_COMMUNITY)
Admission: AD | Admit: 2024-09-29 | Discharge: 2024-09-29 | Disposition: A | Discharge status: Home / Self Care | Attending: Obstetrics and Gynecology | Admitting: Obstetrics and Gynecology

## 2024-09-29 DIAGNOSIS — Z3689 Encounter for other specified antenatal screening: Secondary | ICD-10-CM

## 2024-09-29 DIAGNOSIS — N760 Acute vaginitis: Secondary | ICD-10-CM | POA: Diagnosis not present

## 2024-09-29 DIAGNOSIS — O99891 Other specified diseases and conditions complicating pregnancy: Secondary | ICD-10-CM | POA: Diagnosis not present

## 2024-09-29 DIAGNOSIS — O23593 Infection of other part of genital tract in pregnancy, third trimester: Secondary | ICD-10-CM | POA: Insufficient documentation

## 2024-09-29 DIAGNOSIS — N750 Cyst of Bartholin's gland: Secondary | ICD-10-CM

## 2024-09-29 DIAGNOSIS — O98813 Other maternal infectious and parasitic diseases complicating pregnancy, third trimester: Secondary | ICD-10-CM | POA: Diagnosis not present

## 2024-09-29 DIAGNOSIS — B3731 Acute candidiasis of vulva and vagina: Secondary | ICD-10-CM | POA: Diagnosis not present

## 2024-09-29 DIAGNOSIS — Z3A37 37 weeks gestation of pregnancy: Secondary | ICD-10-CM

## 2024-09-29 DIAGNOSIS — R102 Pelvic and perineal pain unspecified side: Secondary | ICD-10-CM

## 2024-09-29 DIAGNOSIS — O26893 Other specified pregnancy related conditions, third trimester: Secondary | ICD-10-CM | POA: Diagnosis not present

## 2024-09-29 DIAGNOSIS — B9689 Other specified bacterial agents as the cause of diseases classified elsewhere: Secondary | ICD-10-CM

## 2024-09-29 LAB — WET PREP, GENITAL
Sperm: NONE SEEN
Trich, Wet Prep: NONE SEEN
WBC, Wet Prep HPF POC: >=10 — AB (ref <10)

## 2024-09-29 LAB — URINALYSIS, MICROSCOPIC (REFLEX): Bacteria, UA: NONE SEEN

## 2024-09-29 LAB — URINALYSIS, ROUTINE W REFLEX MICROSCOPIC
Bilirubin Urine: NEGATIVE
Glucose, UA: NEGATIVE mg/dL
Hgb urine dipstick: NEGATIVE
Ketones, ur: NEGATIVE mg/dL
Nitrite: NEGATIVE
Protein, ur: NEGATIVE mg/dL
Specific Gravity, Urine: 1.01 (ref 1.005–1.030)
pH: 7 (ref 5.0–8.0)

## 2024-09-29 MED ORDER — FLUCONAZOLE 150 MG PO TABS: 150.0000 mg | ORAL_TABLET | Freq: Once | ORAL | 0 refills | Status: AC

## 2024-09-29 MED ORDER — METRONIDAZOLE 500 MG PO TABS: 500.0000 mg | ORAL_TABLET | Freq: Two times a day (BID) | ORAL | 0 refills | Status: DC

## 2024-09-29 NOTE — MAU Provider Note (Signed)
 Chief Complaint:  Vaginal Pain   HPI    Nicole [name] is a 33 y.o. G2P1001 at [redacted]w[redacted]d who presents to maternity admissions reporting vaginal pain has been it is difficult to sit and/or walk.  Patient states she is having irregular contractions but denies any vaginal bleeding, leaking of fluid, reports good fetal movements.   Pregnancy Course: Femina  Prenatal care is complicated by schizophrenia, MDD, hypothyroidism, anxiety, schizoaffective disorder bipolar, hypertension, type 2 diabetes, obesity, gonorrhea and pregnancy, HSV, GBS bacteriuria  Past Medical History:  Diagnosis Date   Anxiety    Depression    Diabetes mellitus without complication (HCC)    HSV-2 (herpes simplex virus 2) infection    Hypothyroidism 08/07/2022   Schizophrenia (HCC)    takes abilify    Tobacco use disorder 08/07/2022   OB History  Gravida Para Term Preterm AB Living  2 1 1  0 0 1  SAB IAB Ectopic Multiple Live Births  0 0 0 0 1    # Outcome Date GA Lbr Len/2nd Weight Sex Type Anes PTL Lv  2 Current           1 Term 03/25/18   2807 g F Vag-Spont   LIV   Past Surgical History:  Procedure Laterality Date   NO PAST SURGERIES     WISDOM TOOTH EXTRACTION Bilateral 2020   Family History  Problem Relation Age of Onset   Hypertension Father    Diabetes Father    Social History[1] Allergies[2] Facility-Administered Medications Prior to Admission  Medication Dose Route Frequency Provider Last Rate Last Admin   ARIPiprazole  ER (ABILIFY  MAINTENA) 400 MG prefilled syringe 400 mg  400 mg Intramuscular Q28 days    400 mg at 08/11/24 1345   Medications Prior to Admission  Medication Sig Dispense Refill Last Dose/Taking   aspirin  EC 81 MG tablet Take 1 tablet (81 mg total) by mouth daily. Swallow whole. 90 tablet 5 09/29/2024   cyclobenzaprine  (FLEXERIL ) 5 MG tablet Take 1 tablet (5 mg total) by mouth 3 (three) times daily as needed for muscle spasms. 20 tablet 0 09/28/2024   hydrOXYzine  (ATARAX ) 10 MG  tablet Take 1 tablet (10 mg total) by mouth 3 (three) times daily as needed. 90 tablet 3 09/28/2024   lamoTRIgine  (LAMICTAL ) 25 MG tablet Take 2 tablets (50 mg total) by mouth daily. 60 tablet 3 09/28/2024   QUEtiapine  (SEROQUEL ) 50 MG tablet Take 1 tablet (50 mg total) by mouth at bedtime. 30 tablet 3 09/28/2024   sertraline  (ZOLOFT ) 50 MG tablet Take 3 tablets (150 mg total) by mouth at bedtime. 90 tablet 3 09/28/2024   valACYclovir  (VALTREX ) 500 MG tablet Take 1 tablet (500 mg total) by mouth daily. 30 tablet 8 09/28/2024   Accu-Chek Softclix Lancets lancets Use as instructed 100 each 12    amoxicillin (AMOXIL) 500 MG capsule Take 1 capsule (500 mg total) by mouth 3 (three) times daily for 7 days. 21 capsule 0    ARIPiprazole  ER (ABILIFY  MAINTENA) 400 MG PRSY prefilled syringe Inject 400 mg into the muscle every 28 (twenty-eight) days. 3 each 11    Blood Glucose Monitoring Suppl (ACCU-CHEK GUIDE) w/Device KIT 1 Device by Does not apply route 4 (four) times daily. 1 kit 0    famotidine  (PEPCID ) 40 MG tablet Take 1 tablet (40 mg total) by mouth daily. (Patient not taking: No sig reported) 90 tablet 3    glucose blood (ACCU-CHEK GUIDE TEST) test strip Use as instructed 100 each 12  ondansetron  (ZOFRAN -ODT) 8 MG disintegrating tablet Take 0.5 tablets (4 mg total) by mouth every 6 (six) hours as needed for nausea or vomiting. 20 tablet 0    Prenatal 28-0.8 MG TABS Take 1 tablet by mouth daily. 90 tablet 12     I have reviewed patient's Past Medical Hx, Surgical Hx, Family Hx, Social Hx, medications and allergies.   ROS  Pertinent items noted in HPI and remainder of comprehensive ROS otherwise negative.   PHYSICAL EXAM  Patient Vitals for the past 24 hrs:  BP Temp Temp src Pulse Resp SpO2 Height Weight  09/29/24 1118 110/70 98.6 F (37 C) Oral 87 18 97 % 5' 1 (1.549 m) 89.9 kg    Constitutional: Well-developed, well-nourished female in no acute distress.  Cardiovascular: normal rate &  rhythm, warm and well-perfused Respiratory: normal effort, no problems with respiration noted GI: Abd soft, non-tender, gravid MS: Extremities nontender, no edema, normal ROM Neurologic: Alert and oriented x 4.  GU: no CVA tenderness Pelvic:  Chaperoned by Nat Lacy , RN External exam reveals edematous labia and a small left bartholin cyst < 0.5 cm in size , no visual lesions Speculum exam reveals whitish discharge with odor, no evidence of pooling, no evidence of blood an the vault and cervix is visually closed  SVE: 2/50/-3   Dilation: 2 Effacement (%): 50 Station: -3 Exam by:: L. Mykel Mohl, NP  Fetal Tracing: NST Reactive Baseline: 150 Variability: moderate  Accelerations: present Decelerations: absent Toco: irregular   Labs: Results for orders placed or performed during the hospital encounter of 09/29/24 (from the past 24 hours)  Urinalysis, Routine w reflex microscopic -Urine, Clean Catch     Status: Abnormal   Collection Time: 09/29/24 11:32 AM  Result Value Ref Range   Color, Urine YELLOW YELLOW   APPearance CLEAR CLEAR   Specific Gravity, Urine 1.010 1.005 - 1.030   pH 7.0 5.0 - 8.0   Glucose, UA NEGATIVE NEGATIVE mg/dL   Hgb urine dipstick NEGATIVE NEGATIVE   Bilirubin Urine NEGATIVE NEGATIVE   Ketones, ur NEGATIVE NEGATIVE mg/dL   Protein, ur NEGATIVE NEGATIVE mg/dL   Nitrite NEGATIVE NEGATIVE   Leukocytes,Ua SMALL (A) NEGATIVE  Urinalysis, Microscopic (reflex)     Status: None   Collection Time: 09/29/24 11:32 AM  Result Value Ref Range   RBC / HPF 0-5 0 - 5 RBC/hpf   WBC, UA 0-5 0 - 5 WBC/hpf   Bacteria, UA NONE SEEN NONE SEEN   Squamous Epithelial / HPF 0-5 0 - 5 /HPF  Wet prep, genital     Status: Abnormal   Collection Time: 09/29/24 12:07 PM  Result Value Ref Range   Yeast Wet Prep HPF POC PRESENT (A) NONE SEEN   Trich, Wet Prep NONE SEEN NONE SEEN   Clue Cells Wet Prep HPF POC PRESENT (A) NONE SEEN   WBC, Wet Prep HPF POC >=10 (A) <10   Sperm  NONE SEEN     Imaging:  No results found.  MDM & MAU COURSE  MDM:  HIGH  Prenatal chart reviewed Physical exam performed with pelvic Wet prep : Mixed vaginitis with Yeast and BV  GC chlamydia pending at discharge UA: Negative NST for gestational age and fetal reassurance (Reactive) Small left Bartholin cyst   MAU Course: Orders Placed This Encounter  Procedures   Wet prep, genital   Urinalysis, Routine w reflex microscopic -Urine, Clean Catch   Urinalysis, Microscopic (reflex)   Discharge patient Discharge disposition: 01-Home or Self  Care; Discharge patient date: 09/29/2024   Meds ordered this encounter  Medications   metroNIDAZOLE  (FLAGYL ) 500 MG tablet    Sig: Take 1 tablet (500 mg total) by mouth 2 (two) times daily.    Dispense:  14 tablet    Refill:  0    Supervising Provider:   PRATT, TANYA S [[id number]]   fluconazole  (DIFLUCAN ) 150 MG tablet    Sig: Take 1 tablet (150 mg total) by mouth once for 1 dose.    Dispense:  1 tablet    Refill:  0    Supervising Provider:   PRATT, TANYA S [[id number]]   nystatin (MYCOSTATIN/NYSTOP) powder    Sig: Apply 1 Application topically 3 (three) times daily.    Dispense:  15 g    Refill:  0    Supervising Provider:   PRATT, TANYA S [[id number]]     I have reviewed the patient chart and performed the physical exam . I have ordered & interpreted the lab results and reviewed and interpreted the NST Medications ordered as stated below.  A/P as described below.  Counseling and education provided and patient agreeable  with plan as described below. Verbalized understanding.    ASSESSMENT   1. Bartholin cyst   2. Vulvovaginitis due to yeast   3. Bacterial vaginosis   4. NST (non-stress test) reactive on fetal surveillance   5. [redacted] weeks gestation of pregnancy   6. Vaginal pain     PLAN  Discharge home in stable condition with return precautions.   See AVS for full description of information given to the patient including both verbal and  written. Patient verbalized understanding and agrees with the plan as described above.     Follow-up Information     [facility] Follow up.   Specialty: Obstetrics and Gynecology Why: If symptoms worsen or fail to resolve, As scheduled for ongoing prenatal care Contact information: [address], [address] [place]  [address] [phone]                Allergies as of 09/29/2024       Reactions   Apple Juice Anaphylaxis, Other (See Comments)   Other Anaphylaxis, Other (See Comments)   SALMON   Watermelon Flavoring Agent (non-screening) Anaphylaxis   Apple (diagnostic)    Other Reaction(s): Unknown   Fish Oil    Other Reaction(s): Unknown        Medication List     TAKE these medications    Abilify  Maintena 400 MG Prsy prefilled syringe Generic drug: ARIPiprazole  ER Inject 400 mg into the muscle every 28 (twenty-eight) days.   Accu-Chek Guide Test test strip Generic drug: glucose blood Use as instructed   Accu-Chek Guide w/Device Kit 1 Device by Does not apply route 4 (four) times daily.   Accu-Chek Softclix Lancets lancets Use as instructed   amoxicillin 500 MG capsule Commonly known as: AMOXIL Take 1 capsule (500 mg total) by mouth 3 (three) times daily for 7 days.   aspirin  EC 81 MG tablet Take 1 tablet (81 mg total) by mouth daily. Swallow whole.   cyclobenzaprine  5 MG tablet Commonly known as: FLEXERIL  Take 1 tablet (5 mg total) by mouth 3 (three) times daily as needed for muscle spasms.   famotidine  40 MG tablet Commonly known as: PEPCID  Take 1 tablet (40 mg total) by mouth daily.   fluconazole  150 MG tablet Commonly known as: Diflucan  Take 1 tablet (150 mg  total) by mouth once for 1 dose.   hydrOXYzine  10 MG tablet Commonly known as: ATARAX  Take 1 tablet (10 mg total) by mouth 3 (three) times daily as needed.   lamoTRIgine  25 MG tablet Commonly known as: LAMICTAL  Take 2  tablets (50 mg total) by mouth daily.   metroNIDAZOLE  500 MG tablet Commonly known as: FLAGYL  Take 1 tablet (500 mg total) by mouth 2 (two) times daily.   nystatin powder Commonly known as: MYCOSTATIN/NYSTOP Apply 1 Application topically 3 (three) times daily.   ondansetron  8 MG disintegrating tablet Commonly known as: ZOFRAN -ODT Take 0.5 tablets (4 mg total) by mouth every 6 (six) hours as needed for nausea or vomiting.   Prenatal 28-0.8 MG Tabs Take 1 tablet by mouth daily.   QUEtiapine  50 MG tablet Commonly known as: SEROquel  Take 1 tablet (50 mg total) by mouth at bedtime.   sertraline  50 MG tablet Commonly known as: ZOLOFT  Take 3 tablets (150 mg total) by mouth at bedtime.   valACYclovir  500 MG tablet Commonly known as: VALTREX  Take 1 tablet (500 mg total) by mouth daily.        Olam Dalton, MSN, [facility], [facility]    This chart was dictated using voice recognition software, Dragon. Despite the best efforts of this provider to proofread and correct errors, errors may still occur which can change documentation meaning.       [1]  Social History Tobacco Use   Smoking status: Some Days    Current packs/day: 0.00    Average packs/day: 0.3 packs/day for 1 year (0.3 ttl pk-yrs)    Types: Cigarettes    Start date: 2021    Last attempt to quit: 02/17/2024    Years since quitting: 0.6   Smokeless tobacco: Current  Vaping Use   Vaping status: Former   Quit date: 01/13/2024   Substances: Flavoring  Substance Use Topics   Alcohol use: Not Currently    Comment: weekly   Drug use: Not Currently    Types: Marijuana, Other-see comments    Comment: Xanax  [2]  Allergies Allergen Reactions   Apple Juice Anaphylaxis and Other (See Comments)   Other Anaphylaxis and Other (See Comments)    SALMON   Watermelon Flavoring Agent (Non-Screening) Anaphylaxis   Apple (Diagnostic)     Other Reaction(s): Unknown   Fish Oil      Other Reaction(s): Unknown

## 2024-09-29 NOTE — Discharge Instructions (Signed)
 For the small cyst that appears to be Bartholin cyst it is advised that you use warm sitz bath's with Epsom salt and warm compresses to the area as needed.  If it gets bigger or more uncomfortable please return to the MAU and/or your OB provider for further evaluation.

## 2024-09-29 NOTE — MAU Note (Signed)
 Nicole [name] is a 33 y.o. at [redacted]w[redacted]d here in MAU reporting: twisting pain in vagina that makes it difficult to sit or walk as well as contractions. Denies VB or LOF. Reports adequate FM.    Pain score: 9/10 Vitals:   09/29/24 1118  BP: 110/70  Pulse: 87  Resp: 18  Temp: 98.6 F (37 C)  SpO2: 97%     FHT: 155  Lab orders placed from triage: UA

## 2024-09-30 LAB — GC/CHLAMYDIA PROBE AMP (~~LOC~~) NOT AT ARMC
Chlamydia: NEGATIVE
Comment: NEGATIVE
Comment: NORMAL
Neisseria Gonorrhea: NEGATIVE

## 2024-10-02 ENCOUNTER — Other Ambulatory Visit: Payer: Self-pay

## 2024-10-02 ENCOUNTER — Encounter (HOSPITAL_COMMUNITY): Payer: Self-pay | Admitting: Obstetrics and Gynecology

## 2024-10-02 ENCOUNTER — Inpatient Hospital Stay (HOSPITAL_COMMUNITY)
Admission: AD | Admit: 2024-10-02 | Discharge: 2024-10-02 | Disposition: A | Discharge status: Home / Self Care | Attending: Obstetrics and Gynecology | Admitting: Obstetrics and Gynecology

## 2024-10-02 DIAGNOSIS — O99213 Obesity complicating pregnancy, third trimester: Secondary | ICD-10-CM | POA: Insufficient documentation

## 2024-10-02 DIAGNOSIS — O368130 Decreased fetal movements, third trimester, not applicable or unspecified: Secondary | ICD-10-CM | POA: Insufficient documentation

## 2024-10-02 DIAGNOSIS — Z3A37 37 weeks gestation of pregnancy: Secondary | ICD-10-CM | POA: Diagnosis not present

## 2024-10-02 DIAGNOSIS — Z3689 Encounter for other specified antenatal screening: Secondary | ICD-10-CM

## 2024-10-02 MED ORDER — LACTATED RINGERS IV BOLUS
1000.0000 mL | Freq: Once | INTRAVENOUS | Status: AC
  Administered 2024-10-02: 1000 mL via INTRAVENOUS

## 2024-10-02 NOTE — MAU Note (Signed)
 Nicole [name] is a 33 y.o. at [redacted]w[redacted]d here in MAU reporting: arrived via ems for DFM over the last 24 hours and thinks her water may have broken yesterday morning. Denies any ctxs or pain currently. Denies any VB. States she has had an increase in mucus discharge but no other leakage of fluids.   Onset of complaint: today Pain score: 0 Vitals:   10/02/24 1438  BP: 114/73  Pulse: 98  Temp: 97.9 F (36.6 C)     FHT:153 Lab orders placed from triage:

## 2024-10-02 NOTE — MAU Provider Note (Signed)
 " History     CSN: [id number]  Arrival date and time: 10/02/24 1432   Event Date/Time   First Provider Initiated Contact with Patient 10/02/24 1516      Chief Complaint  Patient presents with   Decreased Fetal Movement   HPI [name] is a 33 y.o. year old G61P1001 female at [redacted]w[redacted]d weeks gestation who presents to MAU reporting decreased fetal movement x 24 hours.  She reports that her water may have broken yesterday.  She describes as increased mucus discharge and no continued leaking.  She receives prenatal care Nicole [name]; next appointment 10/05/2024.  OB History     Gravida  2   Para  1   Term  1   Preterm  0   AB  0   Living  1      SAB  0   IAB  0   Ectopic  0   Multiple  0   Live Births  1           Past Medical History:  Diagnosis Date   Anxiety    Depression    Diabetes mellitus without complication (HCC)    HSV-2 (herpes simplex virus 2) infection    Hypothyroidism 08/07/2022   Schizophrenia (HCC)    takes abilify    Tobacco use disorder 08/07/2022    Past Surgical History:  Procedure Laterality Date   NO PAST SURGERIES     WISDOM TOOTH EXTRACTION Bilateral 2020    Family History  Problem Relation Age of Onset   Hypertension Father    Diabetes Father     Social History[1]  Allergies: Allergies[2]  Facility-Administered Medications Prior to Admission  Medication Dose Route Frequency Provider Last Rate Last Admin   ARIPiprazole  ER (ABILIFY  MAINTENA) 400 MG prefilled syringe 400 mg  400 mg Intramuscular Q28 days    400 mg at 08/11/24 1345   Medications Prior to Admission  Medication Sig Dispense Refill Last Dose/Taking   ARIPiprazole  ER (ABILIFY  MAINTENA) 400 MG PRSY prefilled syringe Inject 400 mg into the muscle every 28 (twenty-eight) days. 3 each 11 Past Month   aspirin  EC 81 MG tablet Take 1 tablet (81 mg total) by mouth daily. Swallow whole. 90 tablet 5 10/01/2024   cyclobenzaprine  (FLEXERIL ) 5 MG tablet Take 1  tablet (5 mg total) by mouth 3 (three) times daily as needed for muscle spasms. 20 tablet 0 Past Week   hydrOXYzine  (ATARAX ) 10 MG tablet Take 1 tablet (10 mg total) by mouth 3 (three) times daily as needed. 90 tablet 3 10/01/2024   lamoTRIgine  (LAMICTAL ) 25 MG tablet Take 2 tablets (50 mg total) by mouth daily. 60 tablet 3 10/01/2024   metroNIDAZOLE  (FLAGYL ) 500 MG tablet Take 1 tablet (500 mg total) by mouth 2 (two) times daily. 14 tablet 0 10/01/2024   nystatin  (MYCOSTATIN /NYSTOP ) powder Apply 1 Application topically 3 (three) times daily. 15 g 0 10/01/2024   Prenatal 28-0.8 MG TABS Take 1 tablet by mouth daily. 90 tablet 12 Past Month   QUEtiapine  (SEROQUEL ) 50 MG tablet Take 1 tablet (50 mg total) by mouth at bedtime. 30 tablet 3 10/01/2024   sertraline  (ZOLOFT ) 50 MG tablet Take 3 tablets (150 mg total) by mouth at bedtime. 90 tablet 3 10/01/2024   valACYclovir  (VALTREX ) 500 MG tablet Take 1 tablet (500 mg total) by mouth daily. 30 tablet 8 10/01/2024   Accu-Chek Softclix Lancets lancets Use as instructed 100 each 12    Blood Glucose Monitoring Suppl (ACCU-CHEK GUIDE)  w/Device KIT 1 Device by Does not apply route 4 (four) times daily. 1 kit 0    famotidine  (PEPCID ) 40 MG tablet Take 1 tablet (40 mg total) by mouth daily. (Patient not taking: No sig reported) 90 tablet 3    glucose blood (ACCU-CHEK GUIDE TEST) test strip Use as instructed 100 each 12    ondansetron  (ZOFRAN -ODT) 8 MG disintegrating tablet Take 0.5 tablets (4 mg total) by mouth every 6 (six) hours as needed for nausea or vomiting. 20 tablet 0 Unknown    Review of Systems  Constitutional: Negative.   HENT: Negative.    Eyes: Negative.   Respiratory: Negative.    Cardiovascular: Negative.   Gastrointestinal: Negative.   Endocrine: Negative.   Genitourinary:        DFM x 24 hrs  Musculoskeletal: Negative.   Skin: Negative.   Allergic/Immunologic: Negative.   Neurological: Negative.   Hematological: Negative.    Psychiatric/Behavioral: Negative.     Physical Exam   Blood pressure 114/73, pulse 98, temperature 97.9 F (36.6 C), temperature source Oral, last menstrual period 01/13/2024.  Physical Exam Vitals and nursing note reviewed.  Constitutional:      Appearance: Normal appearance. She is obese.  Cardiovascular:     Rate and Rhythm: Normal rate.  Pulmonary:     Effort: Pulmonary effort is normal.  Abdominal:     Palpations: Abdomen is soft.     Comments: gravid  Musculoskeletal:        General: Normal range of motion.  Skin:    General: Skin is warm and dry.  Neurological:     Mental Status: She is alert and oriented to person, place, and time.  Psychiatric:        Mood and Affect: Mood normal.        Behavior: Behavior normal.        Thought Content: Thought content normal.        Judgment: Judgment normal.    REACTIVE NST - FHR: 155 bpm / moderate variability / accels present / decels absent / TOCO: irregular UC's with UI noted  MAU Course  Procedures  MDM EFM  Assessment and Plan  1. NST (non-stress test) reactive (Primary) - Reassurance given of reactive tracing which is indicative of fetal wellbeing being within normal limits  2. [redacted] weeks gestation of pregnancy   - Discharge home - Keep scheduled appt with Nicole [name] on 10/05/2024 - Patient verbalized an understanding of the plan of care and agrees.   Ala Cart, CNM 10/02/2024, 3:17 PM      [1]  Social History Tobacco Use   Smoking status: Former    Current packs/day: 0.00    Average packs/day: 0.3 packs/day for 1 year (0.3 ttl pk-yrs)    Types: Cigarettes    Start date: 2021    Quit date: 02/17/2024    Years since quitting: 0.6   Smokeless tobacco: Former  [employer]   Vaping status: Former   Quit date: 01/13/2024   Substances: Flavoring  Substance Use Topics   Alcohol use: Not Currently    Comment: weekly   Drug use: Not Currently    Types: Marijuana, Other-see comments    Comment: Xanax   [2]  Allergies Allergen Reactions   Apple Juice Anaphylaxis and Other (See Comments)   Other Anaphylaxis and Other (See Comments)    SALMON   Watermelon Flavoring Agent (Non-Screening) Anaphylaxis   Apple (Diagnostic)     Other Reaction(s): Unknown   Fish Oil  Other Reaction(s): Unknown   "

## 2024-10-03 ENCOUNTER — Other Ambulatory Visit: Payer: Self-pay

## 2024-10-03 ENCOUNTER — Inpatient Hospital Stay (HOSPITAL_COMMUNITY)
Admission: AD | Admit: 2024-10-03 | Discharge: 2024-10-06 | DRG: 805 | Disposition: A | Discharge status: Home / Self Care | Attending: Family Medicine | Admitting: Family Medicine

## 2024-10-03 ENCOUNTER — Encounter (HOSPITAL_COMMUNITY): Payer: Self-pay | Admitting: Obstetrics and Gynecology

## 2024-10-03 DIAGNOSIS — O99354 Diseases of the nervous system complicating childbirth: Secondary | ICD-10-CM | POA: Diagnosis present

## 2024-10-03 DIAGNOSIS — F250 Schizoaffective disorder, bipolar type: Secondary | ICD-10-CM | POA: Diagnosis present

## 2024-10-03 DIAGNOSIS — G4733 Obstructive sleep apnea (adult) (pediatric): Secondary | ICD-10-CM | POA: Diagnosis present

## 2024-10-03 DIAGNOSIS — O1092 Unspecified pre-existing hypertension complicating childbirth: Secondary | ICD-10-CM | POA: Diagnosis present

## 2024-10-03 DIAGNOSIS — O99214 Obesity complicating childbirth: Secondary | ICD-10-CM | POA: Diagnosis present

## 2024-10-03 DIAGNOSIS — E039 Hypothyroidism, unspecified: Secondary | ICD-10-CM | POA: Diagnosis present

## 2024-10-03 DIAGNOSIS — Z7982 Long term (current) use of aspirin: Secondary | ICD-10-CM

## 2024-10-03 DIAGNOSIS — O99284 Endocrine, nutritional and metabolic diseases complicating childbirth: Secondary | ICD-10-CM | POA: Diagnosis present

## 2024-10-03 DIAGNOSIS — Z3A37 37 weeks gestation of pregnancy: Secondary | ICD-10-CM | POA: Diagnosis not present

## 2024-10-03 DIAGNOSIS — O4292 Full-term premature rupture of membranes, unspecified as to length of time between rupture and onset of labor: Secondary | ICD-10-CM | POA: Diagnosis present

## 2024-10-03 DIAGNOSIS — O99344 Other mental disorders complicating childbirth: Secondary | ICD-10-CM | POA: Diagnosis not present

## 2024-10-03 DIAGNOSIS — O4202 Full-term premature rupture of membranes, onset of labor within 24 hours of rupture: Secondary | ICD-10-CM | POA: Diagnosis not present

## 2024-10-03 DIAGNOSIS — Z30017 Encounter for initial prescription of implantable subdermal contraceptive: Secondary | ICD-10-CM | POA: Diagnosis not present

## 2024-10-03 DIAGNOSIS — Z833 Family history of diabetes mellitus: Secondary | ICD-10-CM | POA: Diagnosis not present

## 2024-10-03 DIAGNOSIS — Z79899 Other long term (current) drug therapy: Secondary | ICD-10-CM | POA: Diagnosis not present

## 2024-10-03 DIAGNOSIS — Z8249 Family history of ischemic heart disease and other diseases of the circulatory system: Secondary | ICD-10-CM

## 2024-10-03 DIAGNOSIS — O9832 Other infections with a predominantly sexual mode of transmission complicating childbirth: Secondary | ICD-10-CM | POA: Diagnosis present

## 2024-10-03 DIAGNOSIS — O0990 Supervision of high risk pregnancy, unspecified, unspecified trimester: Principal | ICD-10-CM

## 2024-10-03 DIAGNOSIS — A6000 Herpesviral infection of urogenital system, unspecified: Secondary | ICD-10-CM | POA: Diagnosis present

## 2024-10-03 DIAGNOSIS — E119 Type 2 diabetes mellitus without complications: Secondary | ICD-10-CM | POA: Diagnosis present

## 2024-10-03 DIAGNOSIS — O98213 Gonorrhea complicating pregnancy, third trimester: Secondary | ICD-10-CM

## 2024-10-03 DIAGNOSIS — O99820 Streptococcus B carrier state complicating pregnancy: Secondary | ICD-10-CM | POA: Diagnosis not present

## 2024-10-03 DIAGNOSIS — O2412 Pre-existing diabetes mellitus, type 2, in childbirth: Secondary | ICD-10-CM | POA: Diagnosis present

## 2024-10-03 DIAGNOSIS — Z87891 Personal history of nicotine dependence: Secondary | ICD-10-CM | POA: Diagnosis not present

## 2024-10-03 DIAGNOSIS — R8271 Bacteriuria: Secondary | ICD-10-CM

## 2024-10-03 DIAGNOSIS — O26893 Other specified pregnancy related conditions, third trimester: Secondary | ICD-10-CM | POA: Diagnosis present

## 2024-10-03 DIAGNOSIS — O99824 Streptococcus B carrier state complicating childbirth: Secondary | ICD-10-CM | POA: Diagnosis present

## 2024-10-03 LAB — CBC
HCT: 38.6 % (ref 36.0–46.0)
Hemoglobin: 12.8 g/dL (ref 12.0–15.0)
MCH: 31.1 pg (ref 26.0–34.0)
MCHC: 33.2 g/dL (ref 30.0–36.0)
MCV: 93.7 fL (ref 80.0–100.0)
Platelets: 248 K/uL (ref 150–400)
RBC: 4.12 MIL/uL (ref 3.87–5.11)
RDW: 13.1 % (ref 11.5–15.5)
WBC: 12.1 K/uL — ABNORMAL HIGH (ref 4.0–10.5)
nRBC: 0 % (ref 0.0–0.2)

## 2024-10-03 LAB — GLUCOSE, CAPILLARY: Glucose-Capillary: 90 mg/dL (ref 70–99)

## 2024-10-03 LAB — TSH: TSH: 2.97 u[IU]/mL (ref 0.350–4.500)

## 2024-10-03 LAB — POCT FERN TEST: POCT Fern Test: POSITIVE

## 2024-10-03 LAB — TYPE AND SCREEN
ABO/RH(D): O POS
Antibody Screen: NEGATIVE

## 2024-10-03 MED ORDER — OXYTOCIN-SODIUM CHLORIDE 30-0.9 UT/500ML-% IV SOLN: 2.5000 [IU]/h | INTRAVENOUS | Status: DC

## 2024-10-03 MED ORDER — QUETIAPINE FUMARATE 50 MG PO TABS
50.0000 mg | ORAL_TABLET | Freq: Every day | ORAL | Status: DC
  Administered 2024-10-03 – 2024-10-05 (×3): 50 mg via ORAL
  Filled 2024-10-03 (×5): qty 1

## 2024-10-03 MED ORDER — ONDANSETRON HCL 4 MG/2ML IJ SOLN: 4.0000 mg | Freq: Four times a day (QID) | INTRAMUSCULAR | Status: DC | PRN

## 2024-10-03 MED ORDER — OXYTOCIN-SODIUM CHLORIDE 30-0.9 UT/500ML-% IV SOLN
1.0000 m[IU]/min | INTRAVENOUS | Status: DC
  Administered 2024-10-03: 2 m[IU]/min via INTRAVENOUS
  Filled 2024-10-03: qty 500

## 2024-10-03 MED ORDER — ACETAMINOPHEN 325 MG PO TABS: 650.0000 mg | ORAL_TABLET | ORAL | Status: DC | PRN

## 2024-10-03 MED ORDER — SODIUM CHLORIDE 0.9% FLUSH: 3.0000 mL | INTRAVENOUS | Status: DC | PRN

## 2024-10-03 MED ORDER — LACTATED RINGERS IV SOLN: 500.0000 mL | INTRAVENOUS | Status: DC | PRN

## 2024-10-03 MED ORDER — LAMOTRIGINE 25 MG PO TABS
50.0000 mg | ORAL_TABLET | Freq: Every day | ORAL | Status: DC
  Administered 2024-10-03 – 2024-10-05 (×3): 50 mg via ORAL
  Filled 2024-10-03 (×5): qty 2

## 2024-10-03 MED ORDER — MISOPROSTOL 25 MCG QUARTER TABLET: 25.0000 ug | ORAL_TABLET | ORAL | Status: DC | PRN

## 2024-10-03 MED ORDER — VALACYCLOVIR HCL 500 MG PO TABS
500.0000 mg | ORAL_TABLET | Freq: Every day | ORAL | Status: DC
  Administered 2024-10-03 – 2024-10-04 (×2): 500 mg via ORAL
  Filled 2024-10-03 (×2): qty 1

## 2024-10-03 MED ORDER — SODIUM CHLORIDE 0.9% FLUSH: 3.0000 mL | Freq: Two times a day (BID) | INTRAVENOUS | Status: DC

## 2024-10-03 MED ORDER — LACTATED RINGERS IV SOLN: INTRAVENOUS | Status: DC

## 2024-10-03 MED ORDER — FENTANYL CITRATE (PF) 100 MCG/2ML IJ SOLN
50.0000 ug | INTRAMUSCULAR | Status: DC | PRN
  Administered 2024-10-04 (×4): 100 ug via INTRAVENOUS
  Filled 2024-10-03 (×4): qty 2

## 2024-10-03 MED ORDER — SOD CITRATE-CITRIC ACID 500-334 MG/5ML PO SOLN: 30.0000 mL | ORAL | Status: DC | PRN

## 2024-10-03 MED ORDER — SODIUM CHLORIDE 0.9 % IV SOLN
5.0000 10*6.[IU] | Freq: Once | INTRAVENOUS | Status: AC
  Administered 2024-10-03: 5 10*6.[IU] via INTRAVENOUS
  Filled 2024-10-03: qty 5

## 2024-10-03 MED ORDER — NYSTATIN 100000 UNIT/GM EX POWD
1.0000 | Freq: Three times a day (TID) | CUTANEOUS | Status: DC
  Filled 2024-10-03 (×2): qty 15

## 2024-10-03 MED ORDER — TERBUTALINE SULFATE 1 MG/ML IJ SOLN
0.2500 mg | Freq: Once | INTRAMUSCULAR | Status: AC | PRN
  Administered 2024-10-04: 0.25 mg via SUBCUTANEOUS
  Filled 2024-10-03: qty 1

## 2024-10-03 MED ORDER — HYDROXYZINE HCL 10 MG PO TABS
10.0000 mg | ORAL_TABLET | Freq: Three times a day (TID) | ORAL | Status: DC | PRN
  Administered 2024-10-04 – 2024-10-05 (×3): 10 mg via ORAL
  Filled 2024-10-03 (×5): qty 1

## 2024-10-03 MED ORDER — PENICILLIN G POT IN DEXTROSE 60000 UNIT/ML IV SOLN
3.0000 10*6.[IU] | INTRAVENOUS | Status: DC
  Administered 2024-10-04 (×3): 3 10*6.[IU] via INTRAVENOUS
  Filled 2024-10-03 (×4): qty 50

## 2024-10-03 MED ORDER — TERBUTALINE SULFATE 1 MG/ML IJ SOLN: 0.2500 mg | Freq: Once | INTRAMUSCULAR | Status: DC | PRN

## 2024-10-03 MED ORDER — OXYTOCIN BOLUS FROM INFUSION
333.0000 mL | Freq: Once | INTRAVENOUS | Status: AC
  Administered 2024-10-04: 333 mL via INTRAVENOUS

## 2024-10-03 MED ORDER — INSULIN ASPART 100 UNIT/ML IJ SOLN: 0.0000 [IU] | INTRAMUSCULAR | Status: DC

## 2024-10-03 MED ORDER — METRONIDAZOLE 500 MG PO TABS
500.0000 mg | ORAL_TABLET | Freq: Two times a day (BID) | ORAL | Status: DC
  Administered 2024-10-03: 500 mg via ORAL
  Filled 2024-10-03 (×3): qty 1

## 2024-10-03 MED ORDER — SERTRALINE HCL 50 MG PO TABS
150.0000 mg | ORAL_TABLET | Freq: Every day | ORAL | Status: DC
  Administered 2024-10-03 – 2024-10-05 (×3): 150 mg via ORAL
  Filled 2024-10-03 (×4): qty 1

## 2024-10-03 MED ORDER — LIDOCAINE HCL (PF) 1 % IJ SOLN: 30.0000 mL | INTRAMUSCULAR | Status: DC | PRN

## 2024-10-03 MED ORDER — SODIUM CHLORIDE 0.9 % IV SOLN: 250.0000 mL | INTRAVENOUS | Status: DC | PRN

## 2024-10-03 NOTE — Progress Notes (Incomplete)
 Guardian Clem Daring Patients Father is giving verbal consent via cellphone for cesarean section consent being obtained by [facility] MD.  [phone]

## 2024-10-03 NOTE — MAU Note (Signed)
 I have been unable to get a hold of the patient's legal guardian, Clem Daring, whom is her father. The patient has tried reaching out to him as well. I have left a message with the weekend social worker to see how we proceed from here if we are unable to get consent from her legal guardian.

## 2024-10-03 NOTE — H&P (Signed)
 OBSTETRIC ADMISSION HISTORY AND PHYSICAL  Nicole [name] is a 33 y.o. female G2P1001 with IUP at [redacted]w[redacted]d (dated by LMP, Estimated Date of Delivery: 10/19/24) presenting for SROM.   She reports +FMs, No LOF, no VB, no blurry vision, headaches or peripheral edema, and RUQ pain.    She plans on formula feeding. She request nexplanon  for birth control.  She received her prenatal care at [facility]   Prenatal History/Complications:   See past medical history below  Past Medical History: Past Medical History:  Diagnosis Date   Anxiety    Depression    Diabetes mellitus without complication (HCC)    HSV-2 (herpes simplex virus 2) infection    Hypothyroidism 08/07/2022   Schizophrenia (HCC)    takes abilify    Tobacco use disorder 08/07/2022    Past Surgical History: Past Surgical History:  Procedure Laterality Date   NO PAST SURGERIES     WISDOM TOOTH EXTRACTION Bilateral 2020    Obstetrical History: OB History     Gravida  2   Para  1   Term  1   Preterm  0   AB  0   Living  1      SAB  0   IAB  0   Ectopic  0   Multiple  0   Live Births  1           Social History Social History   Socioeconomic History   Marital status: Single    Spouse name: Not on file   Number of children: Not on file   Years of education: Not on file   Highest education level: Not on file  Occupational History   Not on file  Tobacco Use   Smoking status: Former    Current packs/day: 0.00    Average packs/day: 0.3 packs/day for 1 year (0.3 ttl pk-yrs)    Types: Cigarettes    Start date: 2021    Quit date: 02/17/2024    Years since quitting: 0.6   Smokeless tobacco: Former  Advertising Account Planner   Vaping status: Former   Quit date: 01/13/2024   Substances: Flavoring  Substance and Sexual Activity   Alcohol use: Not Currently    Comment: weekly   Drug use: Not Currently    Types: Marijuana, Other-see comments    Comment: Xanax   Sexual activity: Yes    Partners: Female     Comment: 2 weeks ago  Other Topics Concern   Not on file  Social History Narrative   Not on file   Social Drivers of Health   Tobacco Use: Medium Risk (10/03/2024)   Patient History    Smoking Tobacco Use: Former    Smokeless Tobacco Use: Former    Passive Exposure: Not on Actuary Strain: Low Risk (12/13/2022)   Overall Financial Resource Strain (CARDIA)    Difficulty of Paying Living Expenses: Not hard at all  Food Insecurity: No Food Insecurity (10/02/2024)   Epic    Worried About Radiation Protection Practitioner of Food in the Last Year: Never true    Ran Out of Food in the Last Year: Never true  Transportation Needs: No Transportation Needs (09/26/2024)   Epic    Lack of Transportation (Medical): No    Lack of Transportation (Non-Medical): No  Recent Concern: Transportation Needs - Unmet Transportation Needs (08/22/2024)   Epic    Lack of Transportation (Medical): Yes    Lack of Transportation (Non-Medical): Yes  Physical Activity: Inactive (12/13/2022)  Exercise Vital Sign    Days of Exercise per Week: 0 days    Minutes of Exercise per Session: 0 min  Stress: Stress Concern Present (12/13/2022)   [employer] of Occupational Health - Occupational Stress Questionnaire    Feeling of Stress : Very much  Social Connections: Socially Integrated (12/13/2022)   Social Connection and Isolation Panel    Frequency of Communication with Friends and Family: More than three times a week    Frequency of Social Gatherings with Friends and Family: Three times a week    Attends Religious Services: More than 4 times per year    Active Member of Clubs or Organizations: Yes    Attends Banker Meetings: More than 4 times per year    Marital Status: Living with partner  Depression (PHQ2-9): Low Risk (09/02/2024)   Depression (PHQ2-9)    PHQ-2 Score: 0  Recent Concern: Depression (PHQ2-9) - High Risk (08/04/2024)   Depression (PHQ2-9)    PHQ-2 Score: 23  Alcohol Screen: Low Risk  (12/13/2022)   Alcohol Screen    Last Alcohol Screening Score (AUDIT): 1  Housing: Low Risk (09/26/2024)   Epic    Unable to Pay for Housing in the Last Year: No    Number of Times Moved in the Last Year: 0    Homeless in the Last Year: No  Utilities: Not At Risk (09/26/2024)   Epic    Threatened with loss of utilities: No  Health Literacy: Not on file    Family History: Family History  Problem Relation Age of Onset   Hypertension Father    Diabetes Father     Allergies: Allergies[1]  Facility-Administered Medications Prior to Admission  Medication Dose Route Frequency Provider Last Rate Last Admin   ARIPiprazole  ER (ABILIFY  MAINTENA) 400 MG prefilled syringe 400 mg  400 mg Intramuscular Q28 days    400 mg at 08/11/24 1345   Medications Prior to Admission  Medication Sig Dispense Refill Last Dose/Taking   ARIPiprazole  ER (ABILIFY  MAINTENA) 400 MG PRSY prefilled syringe Inject 400 mg into the muscle every 28 (twenty-eight) days. 3 each 11 Past Month   aspirin  EC 81 MG tablet Take 1 tablet (81 mg total) by mouth daily. Swallow whole. 90 tablet 5 10/02/2024   cyclobenzaprine  (FLEXERIL ) 5 MG tablet Take 1 tablet (5 mg total) by mouth 3 (three) times daily as needed for muscle spasms. 20 tablet 0 Past Month   hydrOXYzine  (ATARAX ) 10 MG tablet Take 1 tablet (10 mg total) by mouth 3 (three) times daily as needed. 90 tablet 3 10/02/2024   lamoTRIgine  (LAMICTAL ) 25 MG tablet Take 2 tablets (50 mg total) by mouth daily. 60 tablet 3 10/02/2024   metroNIDAZOLE  (FLAGYL ) 500 MG tablet Take 1 tablet (500 mg total) by mouth 2 (two) times daily. 14 tablet 0 10/03/2024   nystatin  (MYCOSTATIN /NYSTOP ) powder Apply 1 Application topically 3 (three) times daily. 15 g 0 10/02/2024   Prenatal 28-0.8 MG TABS Take 1 tablet by mouth daily. 90 tablet 12 Past Month   QUEtiapine  (SEROQUEL ) 50 MG tablet Take 1 tablet (50 mg total) by mouth at bedtime. 30 tablet 3 10/02/2024   sertraline  (ZOLOFT ) 50 MG tablet  Take 3 tablets (150 mg total) by mouth at bedtime. 90 tablet 3 10/02/2024   valACYclovir  (VALTREX ) 500 MG tablet Take 1 tablet (500 mg total) by mouth daily. 30 tablet 8 10/02/2024   Accu-Chek Softclix Lancets lancets Use as instructed 100 each 12    Blood Glucose  Monitoring Suppl (ACCU-CHEK GUIDE) w/Device KIT 1 Device by Does not apply route 4 (four) times daily. 1 kit 0    famotidine  (PEPCID ) 40 MG tablet Take 1 tablet (40 mg total) by mouth daily. (Patient not taking: No sig reported) 90 tablet 3    glucose blood (ACCU-CHEK GUIDE TEST) test strip Use as instructed 100 each 12    ondansetron  (ZOFRAN -ODT) 8 MG disintegrating tablet Take 0.5 tablets (4 mg total) by mouth every 6 (six) hours as needed for nausea or vomiting. 20 tablet 0 More than a month     Review of Systems  All systems reviewed and negative except as stated in HPI.  Blood pressure 113/78, pulse 89, temperature 98.9 F (37.2 C), temperature source Oral, resp. rate 16, last menstrual period 01/13/2024, SpO2 100%. General appearance: alert, cooperative, and appears stated age Lungs: breathing comfortably on room air Heart: regular rate Abdomen: soft, non-tender; gravid Extremities: no edema of bilateral lower extremities DTR's intact Presentation: cephalic Fetal monitoringBaseline: 150 bpm, Variability: Good {> 6 bpm), Accelerations: no accels on admission, and Decelerations: Absent Uterine activityFrequency: Every 2-4 minutes  Dilation: 1.5 Effacement (%): 50 Station: -3 Exam by:: Tyron Nation, RN   Prenatal labs: ABO, Rh: O/Positive/-- (10/27 1022) Antibody: Negative (10/27 1022) Rubella: 1.08 (10/27 1022) RPR: Non Reactive (10/27 1022)  HBsAg: Negative (10/27 1022)  HIV: Non Reactive (10/27 1022)  GBS:    2 hr Glucola not done, hx of T2DM Genetic screening  LR female Anatomy US  Appears normal Last US : At [redacted]w[redacted]d - cephalic presentation, EFW 2866g (36 %tile), AC 74%  Prenatal Transfer Tool  Maternal  Diabetes: Yes:  Diabetes Type:  Pre-pregnancy Genetic Screening: Normal Maternal Ultrasounds/Referrals: Normal Fetal Ultrasounds or other Referrals:  None Maternal Substance Abuse:  Yes:  Type: Smoker Significant Maternal Medications:  Meds include: Other:  Aripiprazole , quetiapine , lamotrigine , sertraline  Significant Maternal Lab Results:  Group B Strep positive Number of Prenatal Visits:greater than 3 verified prenatal visits Other Comments:  None  Results for orders placed or performed during the hospital encounter of 10/03/24 (from the past 24 hours)  POCT fern test   Collection Time: 10/03/24  5:58 PM  Result Value Ref Range   POCT Fern Test Positive = ruptured amniotic membanes     Patient Active Problem List   Diagnosis Date Noted   Labor and delivery, indication for care 10/03/2024   HSV-2 (herpes simplex virus 2) infection 09/21/2024   GBS bacteriuria 08/02/2024   Gonorrhea in pregnancy 06/20/2024   Essential hypertension 02/18/2024   Type 2 diabetes mellitus affecting pregnancy in second trimester, antepartum 02/18/2024   Supervision of high risk pregnancy, antepartum 02/18/2024   Schizoaffective disorder, bipolar type (HCC) 11/07/2023   Anxiety state 12/04/2022   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 G2P1001 at [redacted]w[redacted]d here for SROM  #Labor: Augmentation process explained in detail to the patient. Contracting every 2-3 minutes currently. Will continue expectant management. #Pain: Per patient request #FWB: Cat I tracing #ID:  GBS bacteruria > PCN #MOF: Formula #MOC: Nexplanon  #Circ:   N/A #Schizophrenia/MDD: Seen by outpatient psychiatry. Current meds include aripiprazole  400mg  Depo monthly, lamotrigine  50mg  daily, quetiapine  50mg  at bedtime  and sertraline  150mg  daily. Has been seen by MFM with shared decision made to continue medications in pregnancy. #Hypothyroidism: Was prescribed Synthroid  but has not been taking. Ordered TSH.  #HSV: On valtrex , no active outbreaks.  #T2DM: Last A1c 5.4. Was previously on metformin  and Ozempic but has not been medications during pregnancy. #cHTN: Not on antihypertensives, currently normotensive.   Barkley Angles, MD OB Fellow, Faculty [facility], Center for [facility] 10/03/2024 6:40 PM        [1]  Allergies Allergen Reactions   Apple Juice Anaphylaxis and Other (See Comments)   Other Anaphylaxis and Other (See Comments)    SALMON   Watermelon Flavoring Agent (Non-Screening) Anaphylaxis   Apple (Diagnostic)     Other Reaction(s): Unknown   Fish Oil     Other Reaction(s): Unknown

## 2024-10-03 NOTE — MAU Note (Signed)
 I was able to get a hold of her father, Clem Daring, who is her legal guardian. He is currently in [place] . He gave verbal consent for treatment and we had 2 RN witnesses. Consent form filled out.

## 2024-10-03 NOTE — Progress Notes (Signed)
 Patient informed that the ultrasound is considered a limited OB ultrasound and is not intended to be a complete ultrasound exam.  Patient also informed that the ultrasound is not being completed with the intent of assessing for fetal or placental anomalies or any pelvic abnormalities.  Explained that the purpose of today's ultrasound is to assess for  presentation.  Patient acknowledges the purpose of the exam and the limitations of the study. Patient vertex by US .

## 2024-10-03 NOTE — Progress Notes (Signed)
 LABOR PROGRESS NOTE Pt rechecked at 2100 Patient comfortable without epidural.  SCE: 3/80/-2  FHT: baseline 150, mod variability, +accels, -decels; overall category I. Toco: 2 in  Called her guardian, father Clem Daring [phone], on speakerphone with the patient to obtain consent in the case of CS. The risks of cesarean section discussed with the patient included but were not limited to: bleeding which may require transfusion or reoperation; infection which may require antibiotics; injury to bowel, bladder, ureters or other surrounding organs; injury to the fetus; need for additional procedures including hysterectomy in the event of a life-threatening hemorrhage; placental abnormalities with subsequent pregnancies, incisional problems, thromboembolic phenomenon and other postoperative/anesthesia complications. The patient and her guardian concurred with the proposed plan, giving informed written consent for the procedure.   A/P:  Eat light labor diet meal, then start pit.  Barabara Maier, DO 9:20 PM

## 2024-10-03 NOTE — MAU Note (Addendum)
 Nicole [name] is a 33 y.o. at [redacted]w[redacted]d here in MAU reporting: possible SROM at 0300 today, clear fluid, and contractions every 3-6 min, patient denies having vaginal bleeding. Patient does report feeling fetal movement. Patient arrived via EMS.   EDC:  10/19/2024 Onset of complaint: 0300 today Pain score: 8 Vitals:   10/03/24 1713  BP: 113/78  Pulse: 89  Resp: 16  Temp: 98.9 F (37.2 C)  SpO2: 100%     FHT: 160 Lab orders placed from triage: fern test

## 2024-10-04 ENCOUNTER — Inpatient Hospital Stay (HOSPITAL_COMMUNITY): Admitting: Anesthesiology

## 2024-10-04 ENCOUNTER — Encounter (HOSPITAL_COMMUNITY): Payer: Self-pay | Admitting: Obstetrics and Gynecology

## 2024-10-04 DIAGNOSIS — O99344 Other mental disorders complicating childbirth: Secondary | ICD-10-CM

## 2024-10-04 DIAGNOSIS — O2412 Pre-existing diabetes mellitus, type 2, in childbirth: Secondary | ICD-10-CM

## 2024-10-04 DIAGNOSIS — Z3A37 37 weeks gestation of pregnancy: Secondary | ICD-10-CM

## 2024-10-04 DIAGNOSIS — O4202 Full-term premature rupture of membranes, onset of labor within 24 hours of rupture: Secondary | ICD-10-CM

## 2024-10-04 DIAGNOSIS — O99820 Streptococcus B carrier state complicating pregnancy: Secondary | ICD-10-CM

## 2024-10-04 LAB — SYPHILIS: RPR W/REFLEX TO RPR TITER AND TREPONEMAL ANTIBODIES, TRADITIONAL SCREENING AND DIAGNOSIS ALGORITHM: RPR Ser Ql: NONREACTIVE

## 2024-10-04 LAB — GLUCOSE, CAPILLARY
Glucose-Capillary: 99 mg/dL (ref 70–99)
Glucose-Capillary: 85 mg/dL (ref 70–99)
Glucose-Capillary: 79 mg/dL (ref 70–99)
Glucose-Capillary: 66 mg/dL — ABNORMAL LOW (ref 70–99)

## 2024-10-04 LAB — OB RESULTS CONSOLE GBS: GBS: POSITIVE

## 2024-10-04 MED ORDER — LACTATED RINGERS IV SOLN: 500.0000 mL | Freq: Once | INTRAVENOUS | Status: DC

## 2024-10-04 MED ORDER — WITCH HAZEL-GLYCERIN EX PADS: 1.0000 | MEDICATED_PAD | CUTANEOUS | Status: DC | PRN

## 2024-10-04 MED ORDER — FENTANYL-BUPIVACAINE-NACL 0.5-0.125-0.9 MG/250ML-% EP SOLN
12.0000 mL/h | EPIDURAL | Status: DC | PRN
  Administered 2024-10-04: 12 mL/h via EPIDURAL
  Filled 2024-10-04: qty 250

## 2024-10-04 MED ORDER — LACTATED RINGERS IV SOLN
500.0000 mL | Freq: Once | INTRAVENOUS | Status: AC
  Administered 2024-10-04: 500 mL via INTRAVENOUS

## 2024-10-04 MED ORDER — EPHEDRINE 5 MG/ML INJ: 10.0000 mg | INTRAVENOUS | Status: DC | PRN

## 2024-10-04 MED ORDER — BENZOCAINE-MENTHOL 20-0.5 % EX AERO: 1.0000 | INHALATION_SPRAY | CUTANEOUS | Status: DC | PRN

## 2024-10-04 MED ORDER — ONDANSETRON HCL 4 MG PO TABS: 4.0000 mg | ORAL_TABLET | ORAL | Status: DC | PRN

## 2024-10-04 MED ORDER — DIBUCAINE (PERIANAL) 1 % EX OINT: 1.0000 | TOPICAL_OINTMENT | CUTANEOUS | Status: DC | PRN

## 2024-10-04 MED ORDER — SIMETHICONE 80 MG PO CHEW: 80.0000 mg | CHEWABLE_TABLET | ORAL | Status: DC | PRN

## 2024-10-04 MED ORDER — PHENYLEPHRINE 80 MCG/ML (10ML) SYRINGE FOR IV PUSH (FOR BLOOD PRESSURE SUPPORT): 80.0000 ug | PREFILLED_SYRINGE | INTRAVENOUS | Status: DC | PRN

## 2024-10-04 MED ORDER — ACETAMINOPHEN 325 MG PO TABS
650.0000 mg | ORAL_TABLET | ORAL | Status: DC | PRN
  Administered 2024-10-04 – 2024-10-05 (×2): 650 mg via ORAL
  Filled 2024-10-04 (×2): qty 2

## 2024-10-04 MED ORDER — ZOLPIDEM TARTRATE 5 MG PO TABS: 5.0000 mg | ORAL_TABLET | Freq: Every evening | ORAL | Status: DC | PRN

## 2024-10-04 MED ORDER — DIPHENHYDRAMINE HCL 50 MG/ML IJ SOLN: 12.5000 mg | INTRAMUSCULAR | Status: DC | PRN

## 2024-10-04 MED ORDER — COCONUT OIL OIL: 1.0000 | TOPICAL_OIL | Status: DC | PRN

## 2024-10-04 MED ORDER — IBUPROFEN 600 MG PO TABS
600.0000 mg | ORAL_TABLET | Freq: Four times a day (QID) | ORAL | Status: DC
  Administered 2024-10-04 – 2024-10-05 (×3): 600 mg via ORAL
  Filled 2024-10-04 (×4): qty 1

## 2024-10-04 MED ORDER — TETANUS-DIPHTH-ACELL PERTUSSIS 5-2-15.5 LF-MCG/0.5 IM SUSP: 0.5000 mL | Freq: Once | INTRAMUSCULAR | Status: DC

## 2024-10-04 MED ORDER — ONDANSETRON HCL 4 MG/2ML IJ SOLN: 4.0000 mg | INTRAMUSCULAR | Status: DC | PRN

## 2024-10-04 MED ORDER — SENNOSIDES-DOCUSATE SODIUM 8.6-50 MG PO TABS
2.0000 | ORAL_TABLET | Freq: Every day | ORAL | Status: DC
  Administered 2024-10-05 – 2024-10-06 (×2): 2 via ORAL
  Filled 2024-10-04 (×2): qty 2

## 2024-10-04 MED ORDER — VALACYCLOVIR HCL 500 MG PO TABS
500.0000 mg | ORAL_TABLET | Freq: Two times a day (BID) | ORAL | Status: DC
  Administered 2024-10-05 – 2024-10-06 (×4): 500 mg via ORAL
  Filled 2024-10-04 (×4): qty 1

## 2024-10-04 MED ORDER — CALCIUM CARBONATE ANTACID 500 MG PO CHEW
400.0000 mg | CHEWABLE_TABLET | ORAL | Status: DC | PRN
  Administered 2024-10-04 (×2): 400 mg via ORAL
  Filled 2024-10-04 (×2): qty 2

## 2024-10-04 MED ORDER — PRENATAL MULTIVITAMIN CH
1.0000 | ORAL_TABLET | Freq: Every day | ORAL | Status: DC
  Administered 2024-10-05 – 2024-10-06 (×2): 1 via ORAL
  Filled 2024-10-04 (×2): qty 1

## 2024-10-04 MED ORDER — PHENYLEPHRINE 80 MCG/ML (10ML) SYRINGE FOR IV PUSH (FOR BLOOD PRESSURE SUPPORT)
80.0000 ug | PREFILLED_SYRINGE | INTRAVENOUS | Status: DC | PRN
  Filled 2024-10-04: qty 10

## 2024-10-04 MED ORDER — LIDOCAINE HCL (PF) 1 % IJ SOLN
INTRAMUSCULAR | Status: DC | PRN
  Administered 2024-10-04: 5 mL via EPIDURAL
  Administered 2024-10-04: 4 mL via EPIDURAL

## 2024-10-04 NOTE — Lactation Note (Signed)
 This note was copied from a baby's chart. Lactation Consultation Note  Patient Name: Girl Darbi Chandran Unijb'd Date: 10/04/2024 Age:31 hours Reason for consult: Initial assessment;Early term 37-38.6wks;Infant < 6lbs  P2- MOB came into the hospital wanting to breastfeed, but unsure if she can due to her schizophrenia and medication. MOB believed that infant's low blood sugar was due to her medication. LC reviewed how more than likely it was due to her T2DM. MOB also reported believing that breastfeeding will ensure that her child does not have mental health issues as an adult. LC reviewed how breastfeeding cannot stop mental health issues due to it being caused by biological, physiological and environmental factors. LC reviewed MOB's medication with her and provided her with copies from [facility]. After reading the recommendations, MOB reports that she does not want to breastfeed her child. LC reassured MOB that we are here to support what works best for her and infant. LC reviewed lactation suppression with MOB and encouraged her to call for further assistance as needed.  Maternal Data Has patient been taught Hand Expression?: No Does the patient have breastfeeding experience prior to this delivery?: No  Feeding Mother's Current Feeding Choice: Formula Nipple Type: Slow - flow  Consult Status Consult Status: Complete    Recardo Hoit BS, IBCLC 10/04/2024, 7:13 PM

## 2024-10-04 NOTE — Anesthesia Preprocedure Evaluation (Addendum)
"                                    Anesthesia Evaluation  Patient identified by MRN, date of birth, ID band Patient awake    Reviewed: Allergy & Precautions, NPO status , Patient's Chart, lab work & pertinent test results  History of Anesthesia Complications Negative for: history of anesthetic complications  Airway Mallampati: II   Neck ROM: Full    Dental   Pulmonary sleep apnea , former smoker   Pulmonary exam normal        Cardiovascular hypertension (Pre-E), Normal cardiovascular exam     Neuro/Psych  PSYCHIATRIC DISORDERS Anxiety Depression     Schizophrenia vs schizoaffective d/o  Fetal alcohol syndrome     GI/Hepatic negative GI ROS, Neg liver ROS,,,  Endo/Other  diabetes, Well Controlled, Type 2Hypothyroidism   Obesity   Renal/GU negative Renal ROS     Musculoskeletal negative musculoskeletal ROS (+)    Abdominal   Peds  Hematology  Plt 248k    Anesthesia Other Findings HSV  Reproductive/Obstetrics                              Anesthesia Physical Anesthesia Plan  ASA: 2  Anesthesia Plan: Epidural   Post-op Pain Management: Minimal or no pain anticipated   Induction:   PONV Risk Score and Plan: 2 and Treatment may vary due to age or medical condition  Airway Management Planned: Natural Airway  Additional Equipment: None  Intra-op Plan:   Post-operative Plan:   Informed Consent: I have reviewed the patients History and Physical, chart, labs and discussed the procedure including the risks, benefits and alternatives for the proposed anesthesia with the patient or authorized representative who has indicated his/her understanding and acceptance.     Consent reviewed with POA  Plan Discussed with: Anesthesiologist  Anesthesia Plan Comments: (Labs reviewed. Platelets acceptable, patient not taking any blood thinning medications. Per RN, FHR tracing reported to be stable enough for sitting  procedure. Risks and benefits discussed with patient, including PDPH, backache, epidural hematoma, failed epidural, blood pressure changes, allergic reaction, and nerve injury. Patient expressed understanding and wished to proceed.)         Anesthesia Quick Evaluation  "

## 2024-10-04 NOTE — Discharge Summary (Signed)
 "    Postpartum Discharge Summary  Date of Service updated    Patient Name: Nicole [name] DOB: 1991/04/10 MRN: [id number]  Date of admission: 10/03/2024 Delivery date:10/04/2024 Delivering provider: BARBRA LANG PARAS Date of discharge: 10/06/2024  Admitting diagnosis: Labor and delivery, indication for care [O75.9] Intrauterine pregnancy: [redacted]w[redacted]d     Secondary diagnosis:  Principal Problem:   Labor and delivery, indication for care Active Problems:   OSA (obstructive sleep apnea)  Additional problems: none    Discharge diagnosis: Term Pregnancy Delivered                                              Post partum procedures:Nexplanon  Augmentation: Pitocin  Complications: None  Hospital course: Induction of Labor With Vaginal Delivery   33 y.o. yo [id number] at [redacted]w[redacted]d was admitted to the hospital 10/03/2024 for induction of labor.  Indication for induction: PROM.  Patient had an labor course complicated by prolonged decel requiring terbutaline . Then progressed to complete.  Membrane Rupture Time/Date: 3:00 AM,10/03/2024  Delivery Method:Vaginal, Spontaneous Operative Delivery:N/A Episiotomy: None Lacerations:  Perineal Details of delivery can be found in separate delivery note.  Patient had a postpartum course complicated by nothing. Patient is discharged home 10/06/2024.  Newborn Data: Birth date:10/04/2024 Birth time:12:23 PM Gender:Female Living status:Living Apgars:4 ,7  Weight:2630 g  Magnesium  Sulfate received: No BMZ received: No Rhophylac:N/A MMR:N/A T-DaP:Given prenatally Flu: N/A RSV Vaccine received: Yes Transfusion:No  Immunizations received: Immunization History  Administered Date(s) Administered    sv, Bivalent, Protein Subunit Rsvpref,pf (Abrysvo) 09/21/2024   Influenza,inj,Quad PF,6+ Mos 07/02/2018   MMR 03/26/2018   Tdap 03/26/2018, 09/21/2024    Physical exam  Vitals:   10/04/24 1935 10/05/24 0510 10/05/24 1255 10/06/24 0515  BP: 119/84 104/75  (!) 120/91 116/81  Pulse: 84 77 74 85  Resp: 16 16 16 18   Temp: 97.8 F (36.6 C) 97.9 F (36.6 C) 98 F (36.7 C) 98.3 F (36.8 C)  TempSrc: Oral Oral Oral Oral  SpO2: 100% 99%  100%   General: alert, cooperative, and no distress Lochia: appropriate Uterine Fundus: firm Incision: N/A DVT Evaluation: Calf/Ankle edema is present Labs: Lab Results  Component Value Date   WBC 10.8 (H) 10/05/2024   HGB 10.2 (L) 10/05/2024   HCT 29.9 (L) 10/05/2024   MCV 91.4 10/05/2024   PLT 197 10/05/2024      Latest Ref Rng & Units 08/22/2024   10:07 AM  CMP  Glucose 70 - 99 mg/dL 895   BUN 6 - 20 mg/dL <5   Creatinine 9.55 - 1.00 mg/dL 9.40   Sodium 864 - 854 mmol/L 134   Potassium 3.5 - 5.1 mmol/L 3.8   Chloride 98 - 111 mmol/L 103   CO2 22 - 32 mmol/L 22   Calcium  8.9 - 10.3 mg/dL 8.7   Total Protein 6.5 - 8.1 g/dL 6.5   Total Bilirubin 0.0 - 1.2 mg/dL 0.2   Alkaline Phos 38 - 126 U/L 105   AST 15 - 41 U/L 22   ALT 0 - 44 U/L 22    Edinburgh Score:    10/05/2024    3:36 PM  Edinburgh Postnatal Depression Scale Screening Tool  I have been able to laugh and see the funny side of things. 0  I have looked forward with enjoyment to things. 0  I have blamed myself unnecessarily when things  went wrong. 2  I have been anxious or worried for no good reason. 0  I have felt scared or panicky for no good reason. 0  Things have been getting on top of me. 0  I have been so unhappy that I have had difficulty sleeping. 0  I have felt sad or miserable. 0  I have been so unhappy that I have been crying. 0  The thought of harming myself has occurred to me. 0  Edinburgh Postnatal Depression Scale Total 2   Edinburgh Postnatal Depression Scale Total: 2   After visit meds:  Allergies as of 10/06/2024       Reactions   Apple Juice Anaphylaxis, Other (See Comments)   Other Anaphylaxis, Other (See Comments)   SALMON   Watermelon Flavoring Agent (non-screening) Anaphylaxis   Apple  (diagnostic)    Other Reaction(s): Unknown   Fish Oil    Other Reaction(s): Unknown        Medication List     STOP taking these medications    Accu-Chek Guide Test test strip Generic drug: glucose blood   Accu-Chek Guide w/Device Kit   Accu-Chek Softclix Lancets lancets   aspirin  EC 81 MG tablet   cyclobenzaprine  5 MG tablet Commonly known as: FLEXERIL    famotidine  40 MG tablet Commonly known as: PEPCID    metroNIDAZOLE  500 MG tablet Commonly known as: FLAGYL    Prenatal 28-0.8 MG Tabs       TAKE these medications    Abilify  Maintena 400 MG Prsy prefilled syringe Generic drug: ARIPiprazole  ER Inject 400 mg into the muscle every 28 (twenty-eight) days.   acetaminophen  500 MG tablet Commonly known as: TYLENOL  Take 2 tablets (1,000 mg total) by mouth every 6 (six) hours.   hydrOXYzine  10 MG tablet Commonly known as: ATARAX  Take 1 tablet (10 mg total) by mouth 3 (three) times daily as needed.   ibuprofen  600 MG tablet Commonly known as: ADVIL  Take 1 tablet (600 mg total) by mouth every 6 (six) hours.   lamoTRIgine  25 MG tablet Commonly known as: LAMICTAL  Take 2 tablets (50 mg total) by mouth daily.   nystatin  powder Commonly known as: MYCOSTATIN /NYSTOP  Apply 1 Application topically 3 (three) times daily.   ondansetron  8 MG disintegrating tablet Commonly known as: ZOFRAN -ODT Take 0.5 tablets (4 mg total) by mouth every 6 (six) hours as needed for nausea or vomiting.   QUEtiapine  50 MG tablet Commonly known as: SEROquel  Take 1 tablet (50 mg total) by mouth at bedtime.   sertraline  50 MG tablet Commonly known as: ZOLOFT  Take 3 tablets (150 mg total) by mouth at bedtime.   valACYclovir  500 MG tablet Commonly known as: VALTREX  Take 1 tablet (500 mg total) by mouth daily.         Discharge home in stable condition Infant Feeding: Bottle Infant Disposition:home with mother Discharge instruction: per After Visit Summary and Postpartum  booklet. Activity: Advance as tolerated. Pelvic rest for 6 weeks.  Diet: routine diet Future Appointments: Future Appointments  Date Time Provider Department Center  11/03/2024 10:55 AM Eveline Lynwood MATSU, MD [facility] None  11/10/2024  8:00 AM Harl Zane BRAVO, NP [facility] None   Follow up Visit:  Follow-up Information     [facility] Follow up on 11/03/2024.   Specialty: Obstetrics and Gynecology Contact information: [address] [place]  [address] [phone]  Please schedule this patient for a In person postpartum visit in 4 weeks with the following provider: MD. Additional Postpartum F/U:Postpartum Depression checkup  High risk pregnancy complicated by: schizophrenia Delivery mode:  Vaginal, Spontaneous Anticipated Birth Control:  Nexplanon    10/06/2024 Vina Solian, MD    "

## 2024-10-04 NOTE — Anesthesia Procedure Notes (Signed)
 Epidural Patient location during procedure: OB Start time: 10/04/2024 9:37 AM End time: 10/04/2024 9:40 AM  Staffing Anesthesiologist: Lucious Debby BRAVO, MD Performed: anesthesiologist   Preanesthetic Checklist Completed: patient identified, IV checked, risks and benefits discussed, monitors and equipment checked, pre-op evaluation and timeout performed  Epidural Patient position: sitting Prep: DuraPrep Patient monitoring: continuous pulse ox and blood pressure Approach: midline Location: L2-L3 Injection technique: LOR saline  Needle:  Needle type: Tuohy  Needle gauge: 17 G Needle length: 9 cm Needle insertion depth: 6 cm Catheter size: 19 Gauge Catheter at skin depth: 11 cm Test dose: negative and Other (1% lidocaine )  Assessment Events: blood not aspirated and no cerebrospinal fluid  Additional Notes Patient identified. Risks including, but not limited to, bleeding, infection, nerve damage, paralysis, inadequate analgesia, blood pressure changes, nausea, vomiting, allergic reaction, postpartum back pain, itching, and headache were discussed. Patient expressed understanding and wished to proceed. Sterile prep and drape, including hand hygiene, mask, and sterile gloves were used. The patient was positioned and the spine was prepped. The skin was anesthetized with lidocaine . No paraesthesia or other complication noted. The patient did not experience any signs of intravascular injection such as tinnitus or metallic taste in mouth, nor signs of intrathecal spread such as rapid motor block. Please see nursing notes for vital signs. The patient tolerated the procedure well.   Debby Lucious, MDReason for block:procedure for pain

## 2024-10-05 ENCOUNTER — Encounter: Admitting: Obstetrics and Gynecology

## 2024-10-05 LAB — CBC
HCT: 29.9 % — ABNORMAL LOW (ref 36.0–46.0)
Hemoglobin: 10.2 g/dL — ABNORMAL LOW (ref 12.0–15.0)
MCH: 31.2 pg (ref 26.0–34.0)
MCHC: 34.1 g/dL (ref 30.0–36.0)
MCV: 91.4 fL (ref 80.0–100.0)
Platelets: 197 K/uL (ref 150–400)
RBC: 3.27 MIL/uL — ABNORMAL LOW (ref 3.87–5.11)
RDW: 13.1 % (ref 11.5–15.5)
WBC: 10.8 K/uL — ABNORMAL HIGH (ref 4.0–10.5)
nRBC: 0 % (ref 0.0–0.2)

## 2024-10-05 MED ORDER — IBUPROFEN 600 MG PO TABS
600.0000 mg | ORAL_TABLET | Freq: Four times a day (QID) | ORAL | Status: DC
  Administered 2024-10-05 – 2024-10-06 (×6): 600 mg via ORAL
  Filled 2024-10-05 (×6): qty 1

## 2024-10-05 MED ORDER — ACETAMINOPHEN 500 MG PO TABS
1000.0000 mg | ORAL_TABLET | Freq: Four times a day (QID) | ORAL | Status: DC
  Administered 2024-10-05 – 2024-10-06 (×5): 1000 mg via ORAL
  Filled 2024-10-05 (×5): qty 2
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
## 2024-10-05 NOTE — Patient Instructions (Signed)
 If you are interested in an outpatient lactation consultation -- available in-office or virtually -- please reach out to us  at:  [facility] (First Floor) ?? [address], [place], [place]  ?? [phone] Please leave a message on our lactation voicemail box. We welcome any lactation-related questions or concerns -- our team is here to support you and your baby.  Lactation Support Groups Join us  at: [facility] ?? Tuesdays, 10:00 AM - 12:00 PM ?? [address], [place] [employer], [place]  Lactating parents and lap babies are welcome!  ?? [email or web address]  ?? [email or web address] -------------  Si est interesado en una consulta ambulatoria de lactancia, disponible en el consultorio o virtualmente, comunquese con nosotros en:  [facility] ([facility]er [facility]) ?? [address], [place], [place]  ?? [phone] Por favor, deje un mensaje en nuestro buzn de voz de lactancia. Estamos aqu para responder cualquier pregunta o inquietud relacionada con la lactancia y para apoyarle a usted y a su beb.  Grupos de Apoyo para la Lactancia nase a nosotros en: [facility] ?? Mart[place], de 10:00 a. m. a 12:00 p. m. ?? [address], [place], [place]  Se admiten madres lactantes y bebs en regazo.  ?? [email or web address]  ?? [email or web address]      Delaney Mandril, [facility]

## 2024-10-05 NOTE — Progress Notes (Signed)
 Post Partum Day 1 Subjective: voiding and moderate low abd cramping.   Objective: Blood pressure 104/75, pulse 77, temperature 97.9 F (36.6 C), temperature source Oral, resp. rate 16, last menstrual period 01/13/2024, SpO2 99%, unknown if currently breastfeeding.  Physical Exam:  General: alert, cooperative, and no distress Lochia: appropriate Uterine Fundus: firm Incision: NA DVT Evaluation: No evidence of DVT seen on physical exam.  Recent Labs    10/03/24 1905 10/05/24 0456  HGB 12.8 10.2*  HCT 38.6 29.9*   Lab Results  Component Value Date   HGBA1C 5.4 04/06/2024   HGBA1C 5.1 01/24/2024   HGBA1C 5.7 (H) 10/15/2022    Assessment/Plan: Plan for discharge tomorrow and Contraception Op Nexplanon   Low abd pain w/ benign exam, Nml WBC, AF, Nml bleeding - Add schedule ES Tylenol  and heat. Likely Nml multip cramping. CTO closely.   Schizophrenia--Has guardian - Pt states she will be caring for baby with a care team after D/C - SW consult - Contiue psych meds and F/U w/ psych  Hx Hypothyroidism- TSH Nml 12/21 off of synthroid  - F/U w/ PCP. Will not restart Synthroid  right now.   Hx T2DM. No meds in pregnancy. Nml A1C s I pregnancy. Previously of Metformin  - Check fasting CBG tomorrow. - F/U w/ PCP  CHTN. No meds. BPs Nml in pregnancy and PP - F/U w/ PCP - Will not start Lasix or BP meds for now. CTO closely.    LOS: 2 days   Nicole [name]  Claudene, CNM 10/05/2024, 10:30 AM

## 2024-10-05 NOTE — Anesthesia Postprocedure Evaluation (Signed)
"   Anesthesia Post Note  Patient: SHAWN DANNENBERG  Procedure(s) Performed: AN AD HOC LABOR EPIDURAL     Patient location during evaluation: Mother Baby Anesthesia Type: Epidural Level of consciousness: awake and alert Pain management: pain level controlled Vital Signs Assessment: post-procedure vital signs reviewed and stable Respiratory status: spontaneous breathing, nonlabored ventilation and respiratory function stable Cardiovascular status: stable Postop Assessment: no headache, no backache and epidural receding Anesthetic complications: no   No notable events documented.  Last Vitals:  Vitals:   10/04/24 1935 10/05/24 0510  BP: 119/84 104/75  Pulse: 84 77  Resp: 16 16  Temp: 36.6 C 36.6 C  SpO2: 100% 99%    Last Pain:  Vitals:   10/05/24 0510  TempSrc: Oral  PainSc:    Pain Goal:                   CHERILYN SLOUGH      "

## 2024-10-05 NOTE — Care Management Important Message (Signed)
 Important Message  Patient Details  Name: JAIMARIE RAPOZO MRN: [id number] Date of Birth: 10-21-1990   Important Message Given:  Yes - Medicare IM     Vonzell Arrie Sharps 10/05/2024, 8:37 AM

## 2024-10-06 MED ORDER — ETONOGESTREL 68 MG ~~LOC~~ IMPL
68.0000 mg | DRUG_IMPLANT | Freq: Once | SUBCUTANEOUS | Status: AC
  Administered 2024-10-06: 68 mg via SUBCUTANEOUS
  Filled 2024-10-06: qty 1

## 2024-10-06 MED ORDER — ACETAMINOPHEN 500 MG PO TABS: 1000.0000 mg | ORAL_TABLET | Freq: Four times a day (QID) | ORAL | 0 refills | Status: AC

## 2024-10-06 MED ORDER — IBUPROFEN 600 MG PO TABS: 600.0000 mg | ORAL_TABLET | Freq: Four times a day (QID) | ORAL | 0 refills | Status: AC

## 2024-10-06 MED ORDER — LIDOCAINE HCL 1 % IJ SOLN
0.0000 mL | Freq: Once | INTRAMUSCULAR | Status: AC | PRN
  Administered 2024-10-06: 20 mL via INTRADERMAL
  Filled 2024-10-06: qty 20

## 2024-10-06 NOTE — Procedures (Signed)
" ° ° °  PROCEDURE NOTE  Nicole [name] is a 33 y.o. [id number] here for Nexplanon  insertion  Nexplanon  Insertion Procedure Patient identified, informed consent performed, consent signed.   Patient does understand that irregular bleeding is a very common side effect of this medication. She was advised to have backup contraception for one week after placement. Pregnancy test in clinic today was negative.  Appropriate time out taken.    Patient's left arm was prepped and draped in the usual sterile fashion. The ruler used to measure and mark insertion area.  Patient was prepped with alcohol swab and then injected with 3 ml of 1% lidocaine .  She was prepped with betadine, Nexplanon  removed from packaging,  Device confirmed in needle, then inserted full length of needle and withdrawn per handbook instructions. Nexplanon  was able to palpated in the patient's arm; patient palpated the insert herself. There was minimal blood loss.  Patient insertion site covered with guaze and a pressure bandage to reduce any bruising.    The patient tolerated the procedure well and was given post procedure instructions.    Vina Solian, MD, FACOG Obstetrician & Gynecologist, [facility] for [facility], [facility]     "

## 2024-10-06 NOTE — Plan of Care (Signed)
" °  Problem: Education: Goal: Ability to describe self-care measures that may prevent or decrease complications (Diabetes Survival Skills Education) will improve Outcome: Adequate for Discharge Goal: Individualized Educational Video(s) Outcome: Adequate for Discharge   Problem: Coping: Goal: Ability to adjust to condition or change in health will improve Outcome: Adequate for Discharge   Problem: Fluid Volume: Goal: Ability to maintain a balanced intake and output will improve Outcome: Adequate for Discharge   Problem: Health Behavior/Discharge Planning: Goal: Ability to identify and utilize available resources and services will improve Outcome: Adequate for Discharge Goal: Ability to manage health-related needs will improve Outcome: Adequate for Discharge   Problem: Metabolic: Goal: Ability to maintain appropriate glucose levels will improve Outcome: Adequate for Discharge   Problem: Nutritional: Goal: Maintenance of adequate nutrition will improve Outcome: Adequate for Discharge Goal: Progress toward achieving an optimal weight will improve Outcome: Adequate for Discharge   Problem: Skin Integrity: Goal: Risk for impaired skin integrity will decrease Outcome: Adequate for Discharge   Problem: Tissue Perfusion: Goal: Adequacy of tissue perfusion will improve Outcome: Adequate for Discharge   Problem: Health Behavior/Discharge Planning: Goal: Ability to manage health-related needs will improve Outcome: Adequate for Discharge   Problem: Clinical Measurements: Goal: Ability to maintain clinical measurements within normal limits will improve Outcome: Adequate for Discharge Goal: Will remain free from infection Outcome: Adequate for Discharge Goal: Diagnostic test results will improve Outcome: Adequate for Discharge Goal: Respiratory complications will improve Outcome: Adequate for Discharge Goal: Cardiovascular complication will be avoided Outcome: Adequate for  Discharge   Problem: Activity: Goal: Risk for activity intolerance will decrease Outcome: Adequate for Discharge   Problem: Coping: Goal: Level of anxiety will decrease Outcome: Adequate for Discharge   Problem: Elimination: Goal: Will not experience complications related to bowel motility Outcome: Adequate for Discharge   Problem: Pain Managment: Goal: General experience of comfort will improve and/or be controlled Outcome: Adequate for Discharge   Problem: Safety: Goal: Ability to remain free from injury will improve Outcome: Adequate for Discharge   Problem: Skin Integrity: Goal: Risk for impaired skin integrity will decrease Outcome: Adequate for Discharge   Problem: Education: Goal: Knowledge of condition will improve Outcome: Adequate for Discharge Goal: Individualized Educational Video(s) Outcome: Adequate for Discharge Goal: Individualized Newborn Educational Video(s) Outcome: Adequate for Discharge   Problem: Activity: Goal: Will verbalize the importance of balancing activity with adequate rest periods Outcome: Adequate for Discharge Goal: Ability to tolerate increased activity will improve Outcome: Adequate for Discharge   Problem: Coping: Goal: Ability to identify and utilize available resources and services will improve Outcome: Adequate for Discharge   Problem: Life Cycle: Goal: Chance of risk for complications during the postpartum period will decrease Outcome: Adequate for Discharge   Problem: Role Relationship: Goal: Ability to demonstrate positive interaction with newborn will improve Outcome: Adequate for Discharge   Problem: Skin Integrity: Goal: Demonstration of wound healing without infection will improve Outcome: Adequate for Discharge   "

## 2024-10-06 NOTE — Progress Notes (Signed)
 Pt with ordered fasting glucose scheduled this morning. At 0300, pt in room eating shrimp pasta salad, banana pudding, and orange juice. Requests to not have fingerstick show high blood sugar d/t snack (not true fasting).  Edan Juday, RN 10/06/2024

## 2024-10-06 NOTE — Progress Notes (Signed)
 Patient requesting nexplanon  for birth control. Guardian Albert Brown gave consent and patient signed consent. Tennie Suzen BRAVO, RN

## 2024-10-11 ENCOUNTER — Telehealth (HOSPITAL_COMMUNITY): Payer: Self-pay

## 2024-10-11 NOTE — Telephone Encounter (Signed)
 10/11/2024 1629  Name: Nicole [name] MRN: [id number] DOB: 1990/12/24  Reason for Call:  Transition of Care Hospital Discharge Call  Contact Status: Patient Contact Status: Complete  Language assistant needed:          Follow-Up Questions: Do You Have Any Concerns About Your Health As You Heal From Delivery?: No Do You Have Any Concerns About Your Infants Health?: No  Edinburgh Postnatal Depression Scale:  In the Past 7 Days: I have been able to laugh and see the funny side of things.: As much as I always could I have looked forward with enjoyment to things.: As much as I ever did I have blamed myself unnecessarily when things went wrong.: Yes, some of the time I have been anxious or worried for no good reason.: Yes, sometimes I have felt scared or panicky for no good reason.: No, not at all Things have been getting on top of me.: Yes, sometimes I haven't been coping as well as usual I have been so unhappy that I have had difficulty sleeping.: Not at all I have felt sad or miserable.: No, not at all I have been so unhappy that I have been crying.: No, never The thought of harming myself has occurred to me.: Never Edinburgh Postnatal Depression Scale Total: 6  PHQ2-9 Depression Scale:     Discharge Follow-up: Edinburgh score requires follow up?: No Patient was advised of the following resources:: Breastfeeding Support Group, Support Group  Post-discharge interventions: Reviewed Newborn Safe Sleep Practices  Signature  Rosaline Deretha PEAK

## 2024-10-12 ENCOUNTER — Inpatient Hospital Stay (HOSPITAL_COMMUNITY): Admit: 2024-10-12 | Admitting: Obstetrics & Gynecology

## 2024-10-12 ENCOUNTER — Inpatient Hospital Stay (HOSPITAL_COMMUNITY)

## 2024-10-12 ENCOUNTER — Inpatient Hospital Stay (HOSPITAL_COMMUNITY)
Admit: 2024-10-12 | Discharge: 2024-09-26 | Disposition: A | Payer: Self-pay | Attending: Obstetrics & Gynecology | Admitting: Obstetrics & Gynecology

## 2024-10-20 ENCOUNTER — Telehealth (HOSPITAL_COMMUNITY): Payer: Self-pay | Admitting: Psychiatry

## 2024-10-20 NOTE — Telephone Encounter (Signed)
 Patient is having issues with her medication. She has just given birth. The patient is sleeping too heavy and wants to be able to hear the baby at night. Please advise. Thank you. [phone] is the best number to call.

## 2024-10-21 ENCOUNTER — Other Ambulatory Visit (HOSPITAL_COMMUNITY): Payer: Self-pay | Admitting: Psychiatry

## 2024-10-21 DIAGNOSIS — F250 Schizoaffective disorder, bipolar type: Secondary | ICD-10-CM

## 2024-10-21 MED ORDER — ABILIFY MAINTENA 400 MG IM PRSY: 400.0000 mg | PREFILLED_SYRINGE | INTRAMUSCULAR | 11 refills | Status: DC

## 2024-10-21 NOTE — Telephone Encounter (Signed)
 Patient reports that at night she is unable to wake up to attend to her newborn baby.  She notes that she takes all of her medications at night.  Provider instructed patient not to take hydroxyzine  at night.  Provider also informed patient that trazodone  should only be taken as needed.  Provider informed patient that instead of taking 50 mg trazodone  she can take 25 mg.  She endorsed understanding and agreed.  Provider informed patient that if this does not improve her sleep that Seroquel  could be reduced.  She endorsed understanding and agreed.  Patient reports that provider discussed this with her brother as he has assisting with her baby provide was agreeable.  Patient reports that she had her coworkers and gave her her Abilify  injection in December 2025.  She has an upcoming appointment in January to receive her next injection. Provider instructed patient to take her medications as prescribed.  She endorsed understanding and agreed.  No other concerns at this time.

## 2024-10-27 ENCOUNTER — Encounter (HOSPITAL_COMMUNITY): Payer: Self-pay

## 2024-10-27 ENCOUNTER — Ambulatory Visit (INDEPENDENT_AMBULATORY_CARE_PROVIDER_SITE_OTHER)

## 2024-10-27 VITALS — BP 123/89 | HR 100 | Wt 198.0 lb

## 2024-10-27 DIAGNOSIS — F250 Schizoaffective disorder, bipolar type: Secondary | ICD-10-CM

## 2024-10-27 DIAGNOSIS — F200 Paranoid schizophrenia: Secondary | ICD-10-CM

## 2024-10-27 DIAGNOSIS — F411 Generalized anxiety disorder: Secondary | ICD-10-CM

## 2024-10-27 NOTE — Progress Notes (Signed)
 Patient Arrived for Monthly ARIPiprazole  ER (ABILIFY  MAINTENA) 400 MG Injection Tolerated injection well in LUOQ. Pleasant as Always NO HI/SI   NOR AH/VH

## 2024-11-03 ENCOUNTER — Other Ambulatory Visit (HOSPITAL_COMMUNITY)
Admission: RE | Admit: 2024-11-03 | Discharge: 2024-11-03 | Disposition: A | Source: Ambulatory Visit | Attending: Obstetrics & Gynecology | Admitting: Obstetrics & Gynecology

## 2024-11-03 ENCOUNTER — Encounter: Payer: Self-pay | Admitting: Obstetrics & Gynecology

## 2024-11-03 ENCOUNTER — Ambulatory Visit: Payer: Self-pay | Admitting: Obstetrics & Gynecology

## 2024-11-03 DIAGNOSIS — Z124 Encounter for screening for malignant neoplasm of cervix: Secondary | ICD-10-CM | POA: Insufficient documentation

## 2024-11-03 NOTE — Progress Notes (Signed)
 "   Post Partum Visit Note  Nicole [name] is a 34 y.o. G35P2002 female who presents for a postpartum visit. She is 4 weeks postpartum following a normal spontaneous vaginal delivery.  I have fully reviewed the prenatal and intrapartum course. The delivery was at 37.6 gestational weeks.  Anesthesia: epidural. Postpartum course has been unremarkable. Baby is doing well. Baby is feeding by bottle - Similac Neosure. Bleeding no bleeding. Bowel function is normal. Bladder function is normal. Patient is sexually active. Contraception method is Nexplanon . Postpartum depression screening: negative.   Upstream - 11/03/24 1122       Pregnancy Intention Screening   Does the patient want to become pregnant in the next year? No    Does the patient's partner want to become pregnant in the next year? No    Would the patient like to discuss contraceptive options today? No      Contraception Wrap Up   Current Method Hormonal Implant         The pregnancy intention screening data noted above was reviewed. Potential methods of contraception were discussed. The patient elected to proceed with No data recorded.   Edinburgh Postnatal Depression Scale - 11/03/24 1121       Edinburgh Postnatal Depression Scale:  In the Past 7 Days   I have been able to laugh and see the funny side of things. 0    I have looked forward with enjoyment to things. 0    I have blamed myself unnecessarily when things went wrong. 0    I have been anxious or worried for no good reason. 0    I have felt scared or panicky for no good reason. 0    Things have been getting on top of me. 0    I have been so unhappy that I have had difficulty sleeping. 0    I have felt sad or miserable. 0    I have been so unhappy that I have been crying. 0    The thought of harming myself has occurred to me. 0    Edinburgh Postnatal Depression Scale Total 0          Health Maintenance Due  Topic Date Due   COVID-19 Vaccine (1) Never done    FOOT EXAM  Never done   OPHTHALMOLOGY EXAM  Never done   Diabetic kidney evaluation - Urine ACR  Never done   Pneumococcal Vaccine (1 of 2 - PCV) Never done   Hepatitis B Vaccines 19-59 Average Risk (1 of 3 - 19+ 3-dose series) Never done   Cervical Cancer Screening (HPV/Pap Cotest)  Never done   Medicare Annual Wellness (AWV)  02/05/2024   Influenza Vaccine  05/14/2024   HEMOGLOBIN A1C  10/06/2024    The following portions of the patient's history were reviewed and updated as appropriate: allergies, current medications, past family history, past medical history, past social history, past surgical history, and problem list.  Review of Systems Pertinent items are noted in HPI.  Objective:  BP 104/74   Pulse (!) 101   Ht 5' 1 (1.549 m)   Wt 197 lb (89.4 kg)   LMP 01/13/2024 (Exact Date)   Breastfeeding No   BMI 37.22 kg/m    General:  alert, cooperative, and no distress   Breasts:  normal  Lungs:   Heart:  regular rate and rhythm  Abdomen: soft, non-tender; bowel sounds normal; no masses,  no organomegaly   Wound   GU exam:  normal      Pap done Assessment:   Postpartum examination following vaginal delivery - Plan: Cytology - PAP( [place]), CANCELED: Cytology - PAP( [place])   normal postpartum exam.   Plan:   Essential components of care per ACOG recommendations:  1.  Mood and well being: Patient with negative depression screening today. Reviewed local resources for support.  - Patient tobacco use?   - hx of drug use? No.    2. Infant care and feeding:  -Patient currently breastmilk feeding? No.  -Social determinants of health (SDOH) reviewed in EPIC. No concerns  3. Sexuality, contraception and birth spacing - Patient does not want a pregnancy in the next year.  Desired family size is 2 children.  - Reviewed reproductive life planning. Reviewed contraceptive methods based on pt preferences and effectiveness.  Patient desired Hormonal Implant today.    - Discussed birth spacing of 18 months  4. Sleep and fatigue -Encouraged family/partner/community support of 4 hrs of uninterrupted sleep to help with mood and fatigue  5. Physical Recovery  - Discussed patients delivery and complications. She describes her labor as good. - Patient had a Vaginal, no problems at delivery. Patient had a no laceration. Perineal healing reviewed. Patient expressed understanding - Patient has urinary incontinence? No. - Patient is safe to resume physical and sexual activity  6.  Health Maintenance - HM due items addressed Yes - Last pap smear No results found for: DIAGPAP Pap smear done at today's visit.  -Breast Cancer screening indicated? No.   7. Chronic Disease/Pregnancy Condition follow up: psychiatric  - PCP follow up   Eveline Lynwood MATSU, MD  [facility], [facility]  "

## 2024-11-04 LAB — CYTOLOGY - PAP
Comment: NEGATIVE
High risk HPV: POSITIVE — AB

## 2024-11-07 ENCOUNTER — Ambulatory Visit: Payer: Self-pay | Admitting: Obstetrics & Gynecology

## 2024-11-07 NOTE — Progress Notes (Signed)
 Please schedule colposcopy, patient is aware via MyChart

## 2024-11-10 ENCOUNTER — Encounter (HOSPITAL_COMMUNITY): Payer: Self-pay | Admitting: Psychiatry

## 2024-11-10 ENCOUNTER — Telehealth (INDEPENDENT_AMBULATORY_CARE_PROVIDER_SITE_OTHER): Admitting: Psychiatry

## 2024-11-10 DIAGNOSIS — F411 Generalized anxiety disorder: Secondary | ICD-10-CM | POA: Diagnosis not present

## 2024-11-10 DIAGNOSIS — F250 Schizoaffective disorder, bipolar type: Secondary | ICD-10-CM | POA: Diagnosis not present

## 2024-11-10 MED ORDER — ABILIFY MAINTENA 400 MG IM PRSY: 400.0000 mg | PREFILLED_SYRINGE | INTRAMUSCULAR | 11 refills | Status: AC

## 2024-11-10 MED ORDER — LAMOTRIGINE 100 MG PO TABS: 100.0000 mg | ORAL_TABLET | Freq: Every day | ORAL | 3 refills | Status: AC

## 2024-11-10 MED ORDER — HYDROXYZINE HCL 10 MG PO TABS: 10.0000 mg | ORAL_TABLET | Freq: Three times a day (TID) | ORAL | 3 refills | Status: AC | PRN

## 2024-11-10 MED ORDER — SERTRALINE HCL 50 MG PO TABS: 150.0000 mg | ORAL_TABLET | Freq: Every day | ORAL | 3 refills | Status: AC

## 2024-11-10 MED ORDER — QUETIAPINE FUMARATE 50 MG PO TABS: 50.0000 mg | ORAL_TABLET | Freq: Every day | ORAL | 3 refills | Status: DC

## 2024-11-10 NOTE — Progress Notes (Signed)
 [facility] MD/PA/NP OP Progress Note Virtual Visit via Video Note  I connected with Nicole [name] on 11/10/24 at  8:00 AM EST by a video enabled telemedicine application and verified that I am speaking with the correct person using two identifiers.  Location: Patient: Home Provider: Clinic   I discussed the limitations of evaluation and management by telemedicine and the availability of in person appointments. The patient expressed understanding and agreed to proceed.  I provided 30 minutes of non-face-to-face time during this encounter.     11/10/2024 8:11 AM Nicole [name]  MRN:  [id number]  Chief Complaint: I am okay  HPI: 34 year old female seen today for follow-up psychiatric evaluation.  She has a psychiatric history of schizophrenia, depression, anxiety, insomnia, and tobacco use. She is currently managed on Abilify  maintainer 400 mg, every 28 days, trazodone  25 mg nightly as needed, Seroquel  50 mg nightly, Zoloft  150 mg daily, hydroxyzine  10 mg three times daily as needed, and Lamictal  50 mg daily.  She inform her that her medications are effective in managing her psychiatric condition.  Today she is well-groomed, pleasant, cooperative, and engaged in conversation.  She informed that she is feeling better.  She notes that her mood continues to be stable and reports that he has minimal anxiety and depression.  Patient notes that he is adjusting to being a mother.  She notes that she continues to receive assistance from the child's godmother and her brother.    Since her last visit she notes that her anxiety and depression are well-managed.  Today provider conducted a GAD-7 and patient scored a 5, at her last visit she scored a 23.  Provider also conducted PHQ-9 and patient scored a 0, at her last visit she had a 0 over.  She endorsed adequate sleep (5-6 ours) and appetite.  Today she denies SI/HI/AVH, mania, or paranoia.    Patient request writer document her medications compliance by  writing a letter.  Provider was agreeable.    Patient currently on Lamictal  50.  Lamictal  tapered to its maintenance dose of 100 mg.  She will continue other medication as prescribed.  No other concerns noted at this time.  Visit Diagnosis:    ICD-10-CM   1. Schizoaffective disorder, bipolar type (HCC)  F25.0 QUEtiapine  (SEROQUEL ) 50 MG tablet    lamoTRIgine  (LAMICTAL ) 100 MG tablet    ARIPiprazole  ER (ABILIFY  MAINTENA) 400 MG PRSY prefilled syringe    2. Anxiety state  F41.1 hydrOXYzine  (ATARAX ) 10 MG tablet    sertraline  (ZOLOFT ) 50 MG tablet             Past Psychiatric History: schizophrenia, depression, anxiety, insomnia, and tobacco use. Patient reports that she has been hospitalized due to mental health multiple times.  Patient states that she has done stints at [facility] and [facility]  Past Medical History:  Past Medical History:  Diagnosis Date   Anxiety    Depression    Diabetes mellitus without complication (HCC)    HSV-2 (herpes simplex virus 2) infection    Hypothyroidism 08/07/2022   Schizophrenia (HCC)    takes abilify    Tobacco use disorder 08/07/2022    Past Surgical History:  Procedure Laterality Date   NO PAST SURGERIES     WISDOM TOOTH EXTRACTION Bilateral 2020    Family Psychiatric History: mother attempted suicide, Patient reports that her brother used to abuse marijuana and alcohol. Patient states that her mother used to abuse marijuana and alcohol.   Family History:  Family  History  Problem Relation Age of Onset   Hypertension Father    Diabetes Father     Social History:  Social History   Socioeconomic History   Marital status: Single    Spouse name: Not on file   Number of children: Not on file   Years of education: Not on file   Highest education level: Not on file  Occupational History   Not on file  Tobacco Use   Smoking status: Every Day    Current packs/day: 0.00    Average packs/day: 0.3 packs/day for 1 year (0.3 ttl  pk-yrs)    Types: Cigarettes    Start date: 2021    Last attempt to quit: 02/17/2024    Years since quitting: 0.7   Smokeless tobacco: Former  Advertising Account Planner   Vaping status: Former   Quit date: 01/13/2024   Substances: Flavoring  Substance and Sexual Activity   Alcohol use: Not Currently    Comment: weekly   Drug use: Not Currently    Types: Marijuana, Other-see comments    Comment: Xanax   Sexual activity: Yes    Partners: Female    Birth control/protection: Implant    Comment: 2 weeks ago  Other Topics Concern   Not on file  Social History Narrative   Not on file   Social Drivers of Health   Tobacco Use: High Risk (11/03/2024)   Patient History    Smoking Tobacco Use: Every Day    Smokeless Tobacco Use: Former    Passive Exposure: Not on Actuary Strain: Low Risk (12/13/2022)   Overall Financial Resource Strain (CARDIA)    Difficulty of Paying Living Expenses: Not hard at all  Food Insecurity: No Food Insecurity (10/02/2024)   Epic    Worried About Radiation Protection Practitioner of Food in the Last Year: Never true    Ran Out of Food in the Last Year: Never true  Transportation Needs: No Transportation Needs (09/26/2024)   Epic    Lack of Transportation (Medical): No    Lack of Transportation (Non-Medical): No  Recent Concern: Transportation Needs - Unmet Transportation Needs (08/22/2024)   Epic    Lack of Transportation (Medical): Yes    Lack of Transportation (Non-Medical): Yes  Physical Activity: Inactive (12/13/2022)   Exercise Vital Sign    Days of Exercise per Week: 0 days    Minutes of Exercise per Session: 0 min  Stress: Stress Concern Present (12/13/2022)   [employer] of Occupational Health - Occupational Stress Questionnaire    Feeling of Stress : Very much  Social Connections: Socially Integrated (12/13/2022)   Social Connection and Isolation Panel    Frequency of Communication with Friends and Family: More than three times a week    Frequency of Social  Gatherings with Friends and Family: Three times a week    Attends Religious Services: More than 4 times per year    Active Member of Clubs or Organizations: Yes    Attends Banker Meetings: More than 4 times per year    Marital Status: Living with partner  Depression (PHQ2-9): Low Risk (11/10/2024)   Depression (PHQ2-9)    PHQ-2 Score: 0  Alcohol Screen: Low Risk (12/13/2022)   Alcohol Screen    Last Alcohol Screening Score (AUDIT): 1  Housing: Low Risk (09/26/2024)   Epic    Unable to Pay for Housing in the Last Year: No    Number of Times Moved in the Last Year: 0  Homeless in the Last Year: No  Utilities: Not At Risk (09/26/2024)   Epic    Threatened with loss of utilities: No  Health Literacy: Not on file    Allergies:  Allergies  Allergen Reactions   Apple Juice Anaphylaxis and Other (See Comments)   Other Anaphylaxis and Other (See Comments)    SALMON   Watermelon Flavoring Agent (Non-Screening) Anaphylaxis   Apple (Diagnostic)     Other Reaction(s): Unknown   Fish Oil     Other Reaction(s): Unknown    Metabolic Disorder Labs: Lab Results  Component Value Date   HGBA1C 5.4 04/06/2024   MPG 99.67 01/24/2024   MPG 117 10/15/2022   Lab Results  Component Value Date   PROLACTIN 2.1 (L) 01/24/2024   PROLACTIN 0.6 (L) 01/14/2024   Lab Results  Component Value Date   CHOL 165 01/24/2024   TRIG 49 01/24/2024   HDL 53 01/24/2024   CHOLHDL 3.1 01/24/2024   VLDL 10 01/24/2024   LDLCALC 102 (H) 01/24/2024   LDLCALC 112 (H) 07/25/2022   Lab Results  Component Value Date   TSH 2.970 10/03/2024   TSH 0.891 08/09/2024    Therapeutic Level Labs: No results found for: LITHIUM No results found for: VALPROATE No results found for: CBMZ  Current Medications: Current Outpatient Medications  Medication Sig Dispense Refill   acetaminophen  (TYLENOL ) 500 MG tablet Take 2 tablets (1,000 mg total) by mouth every 6 (six) hours. 30 tablet 0    ARIPiprazole  ER (ABILIFY  MAINTENA) 400 MG PRSY prefilled syringe Inject 400 mg into the muscle every 28 (twenty-eight) days. 3 each 11   hydrOXYzine  (ATARAX ) 10 MG tablet Take 1 tablet (10 mg total) by mouth 3 (three) times daily as needed. 90 tablet 3   ibuprofen  (ADVIL ) 600 MG tablet Take 1 tablet (600 mg total) by mouth every 6 (six) hours. 30 tablet 0   lamoTRIgine  (LAMICTAL ) 100 MG tablet Take 1 tablet (100 mg total) by mouth daily. 30 tablet 3   nystatin  (MYCOSTATIN /NYSTOP ) powder Apply 1 Application topically 3 (three) times daily. 15 g 0   ondansetron  (ZOFRAN -ODT) 8 MG disintegrating tablet Take 0.5 tablets (4 mg total) by mouth every 6 (six) hours as needed for nausea or vomiting. 20 tablet 0   QUEtiapine  (SEROQUEL ) 50 MG tablet Take 1 tablet (50 mg total) by mouth at bedtime. 30 tablet 3   sertraline  (ZOLOFT ) 50 MG tablet Take 3 tablets (150 mg total) by mouth at bedtime. 90 tablet 3   valACYclovir  (VALTREX ) 500 MG tablet Take 1 tablet (500 mg total) by mouth daily. 30 tablet 8   Current Facility-Administered Medications  Medication Dose Route Frequency Provider Last Rate Last Admin   ARIPiprazole  ER (ABILIFY  MAINTENA) 400 MG prefilled syringe 400 mg  400 mg Intramuscular Q28 days    400 mg at 10/27/24 1625     Musculoskeletal: Strength & Muscle Tone: within normal limits Gait & Station: normal Patient leans: N/A  Psychiatric Specialty Exam: Review of Systems  not currently breastfeeding.There is no height or weight on file to calculate BMI.  General Appearance: Well Groomed  Eye Contact:  Good  Speech:  Clear and Coherent and Normal Rate  Volume:  Normal  Mood:  Euthymic,   Affect:  Appropriate and Congruent  Thought Process:  Coherent, Goal Directed, and Linear  Orientation:  Full (Time, Place, and Person)  Thought Content: WDL and Logical   Suicidal Thoughts:  No  Homicidal Thoughts:  No  Memory:  Immediate;  Good Recent;   Good Remote;   Good  Judgement:  Good   Insight:  Good  Psychomotor Activity:  Normal  Concentration:  Concentration: Good and Attention Span: Good  Recall:  Good  Fund of Knowledge: Good  Language: Good  Akathisia:  No  Handed:  Right  AIMS (if indicated):  not done,  Assets:  Communication Skills Desire for Improvement Housing Leisure Time Physical Health Social Support  ADL's:  Intact  Cognition: WNL  Sleep:  Good   Screenings: AIMS    Flowsheet Row Office Visit from 03/16/2024 in [facility] Office Visit from 01/30/2023 in [facility]  AIMS Total Score 0 2   GAD-7    Flowsheet Row Video Visit from 11/10/2024 in [facility] Video Visit from 09/02/2024 in [facility] Video Visit from 08/04/2024 in [facility] Video Visit from 07/08/2024 in [facility] Initial Prenatal from 04/06/2024 in [facility] at [place]  Total GAD-7 Score 5 3 20 7 7    PHQ2-9    Flowsheet Row Video Visit from 11/10/2024 in [facility] Video Visit from 09/02/2024 in [facility] Video Visit from 08/04/2024 in [facility] Video Visit from 07/08/2024 in [facility] Initial Prenatal from 04/06/2024 in [facility] Total Score 0 0 5 0 0  PHQ-9 Total Score 0 0 23 3 0   Flowsheet Row Video Visit from 11/10/2024 in [facility] Admission (Discharged) from 10/03/2024 in [place] 5S Mother Baby Unit Admission (Discharged) from 10/02/2024 in [facility] 1S Maternity Assessment Unit  C-SSRS RISK CATEGORY No Risk No Risk No Risk     Assessment and Plan: Patient reports that her anxiety, depression, and continues to be well-managed.  Patient currently on Lamictal  50.  Lamictal  tapered to  its maintenance dose of 100 mg.  She will continue other medication as prescribed.  1. Schizoaffective disorder, bipolar type (HCC)  Continue- QUEtiapine  (SEROQUEL ) 50 MG tablet; Take 1 tablet (50 mg total) by mouth at bedtime.  Dispense: 30 tablet; Refill: 3 Increased- lamoTRIgine  (LAMICTAL ) 100 MG tablet; Take 1 tablet (100 mg total) by mouth daily.  Dispense: 30 tablet; Refill: 3 Continue- ARIPiprazole  ER (ABILIFY  MAINTENA) 400 MG PRSY prefilled syringe; Inject 400 mg into the muscle every 28 (twenty-eight) days.  Dispense: 3 each; Refill: 11  2. Anxiety state  Continue- hydrOXYzine  (ATARAX ) 10 MG tablet; Take 1 tablet (10 mg total) by mouth 3 (three) times daily as needed.  Dispense: 90 tablet; Refill: 3 Continue- sertraline  (ZOLOFT ) 50 MG tablet; Take 3 tablets (150 mg total) by mouth at bedtime.  Dispense: 90 tablet; Refill: 3    Collaboration of Care: Collaboration of Care: Other provider involved in patient's care AEB counselor, PCP, shock clinic staff  Patient/Guardian was advised Release of Information must be obtained prior to any record release in order to collaborate their care with an outside provider. Patient/Guardian was advised if they have not already done so to contact the registration department to sign all necessary forms in order for us  to release information regarding their care.   Consent: Patient/Guardian gives verbal consent for treatment and assignment of benefits for services provided during this visit. Patient/Guardian expressed understanding and agreed to proceed.   Follow-up in 2 months Follow-up with shot clinic in 1  month Zane FORBES Bach, NP 11/10/2024, 8:11 AM

## 2024-11-17 ENCOUNTER — Telehealth (HOSPITAL_COMMUNITY): Payer: Self-pay

## 2024-11-17 ENCOUNTER — Other Ambulatory Visit (HOSPITAL_COMMUNITY): Payer: Self-pay | Admitting: Psychiatry

## 2024-11-17 DIAGNOSIS — F250 Schizoaffective disorder, bipolar type: Secondary | ICD-10-CM

## 2024-11-17 DIAGNOSIS — F5105 Insomnia due to other mental disorder: Secondary | ICD-10-CM

## 2024-11-17 MED ORDER — QUETIAPINE FUMARATE 100 MG PO TABS: 100.0000 mg | ORAL_TABLET | Freq: Every day | ORAL | 3 refills | Status: AC

## 2024-11-17 MED ORDER — ZOLPIDEM TARTRATE 5 MG PO TABS: 5.0000 mg | ORAL_TABLET | Freq: Every evening | ORAL | 2 refills | Status: AC | PRN

## 2024-11-17 NOTE — Telephone Encounter (Signed)
 Provider spoke to patient and was informed that she has only been sleeping 2 hours.  She notes that her paranoia and hallucinations are worsening.  She informed clinical research associate that she fears that her boyfriend will harm her as her voices are telling her that this will occur.  Patient notes that this is not true and reports that she does not wish to harm herself or her boyfriend.  She notes that her newborn baby is under the care of of her godmother and is safe.  Patient was with her therapist Ms. Avram Dinsmore during our interaction.  Ms. Dinsmore reports that she believes that the patient is safe but does endorse hallucinations and insomnia.  She informed clinical research associate that she and Ms. McCains will check on her more frequently.  Provider endorse understanding.  Today provider agreeable to increasing Seroquel  50 mg to 100 mg to help manage sleep and symptoms of psychosis.  Provider also filled Ambien  5 mg to be taken as needed nightly to help manage sleep.  Provider informed patient that if adjustments are not successful that she should come into [facility] for further assessment.  She endorsed understanding and agreed.Potential side effects of medication and risks vs benefits of treatment vs non-treatment were explained and discussed. All questions were answered.  No other concerns at this time.

## 2024-11-17 NOTE — Telephone Encounter (Signed)
 Pt called and stated that her medication is not working for her at all. Pt stated it very crucial. Pt stated she hear voices and thinks people are trying to hurt her. Pt wants it fix immediately. Pt wants provider to call asap.

## 2024-11-24 ENCOUNTER — Ambulatory Visit (HOSPITAL_COMMUNITY)

## 2024-12-14 ENCOUNTER — Encounter: Payer: Self-pay | Admitting: Obstetrics and Gynecology

## 2025-01-12 ENCOUNTER — Telehealth (HOSPITAL_COMMUNITY): Admitting: Psychiatry
